# Patient Record
Sex: Female | Born: 1955 | Race: White | Hispanic: No | Marital: Single | Smoking: Former smoker
Health system: Southern US, Community
[De-identification: ages and names within clinical notes are randomized; demographics above are authoritative.]

## PROBLEM LIST (undated history)

## (undated) DIAGNOSIS — E119 Type 2 diabetes mellitus without complications: Secondary | ICD-10-CM

## (undated) DIAGNOSIS — N289 Disorder of kidney and ureter, unspecified: Secondary | ICD-10-CM

## (undated) DIAGNOSIS — N185 Chronic kidney disease, stage 5: Secondary | ICD-10-CM

## (undated) DIAGNOSIS — I10 Essential (primary) hypertension: Secondary | ICD-10-CM

## (undated) DIAGNOSIS — I350 Nonrheumatic aortic (valve) stenosis: Secondary | ICD-10-CM

## (undated) DIAGNOSIS — I5031 Acute diastolic (congestive) heart failure: Secondary | ICD-10-CM

## (undated) DIAGNOSIS — I5032 Chronic diastolic (congestive) heart failure: Secondary | ICD-10-CM

## (undated) DIAGNOSIS — J449 Chronic obstructive pulmonary disease, unspecified: Secondary | ICD-10-CM

## (undated) DIAGNOSIS — N186 End stage renal disease: Secondary | ICD-10-CM

## (undated) DIAGNOSIS — E1122 Type 2 diabetes mellitus with diabetic chronic kidney disease: Secondary | ICD-10-CM

## (undated) HISTORY — PX: MANDIBLE SURGERY: SHX707

## (undated) HISTORY — DX: Chronic diastolic (congestive) heart failure: I50.32

## (undated) HISTORY — DX: Nonrheumatic aortic (valve) stenosis: I35.0

## (undated) HISTORY — DX: Acute diastolic (congestive) heart failure: I50.31

## (undated) HISTORY — DX: End stage renal disease: N18.6

## (undated) HISTORY — PX: RENAL BIOPSY: SHX156

## (undated) HISTORY — DX: Chronic obstructive pulmonary disease, unspecified: J44.9

## (undated) HISTORY — PX: APPENDECTOMY: SHX54

---

## 2020-04-30 ENCOUNTER — Other Ambulatory Visit: Payer: Self-pay

## 2020-04-30 ENCOUNTER — Encounter: Payer: Self-pay | Admitting: Emergency Medicine

## 2020-04-30 ENCOUNTER — Emergency Department: Payer: Medicaid Other

## 2020-04-30 ENCOUNTER — Inpatient Hospital Stay
Admission: EM | Admit: 2020-04-30 | Discharge: 2020-07-02 | DRG: 286 | Disposition: A | Discharge status: Home / Self Care | Payer: Medicaid Other | Attending: Internal Medicine | Admitting: Internal Medicine

## 2020-04-30 DIAGNOSIS — T502X5A Adverse effect of carbonic-anhydrase inhibitors, benzothiadiazides and other diuretics, initial encounter: Secondary | ICD-10-CM | POA: Diagnosis present

## 2020-04-30 DIAGNOSIS — R0600 Dyspnea, unspecified: Secondary | ICD-10-CM

## 2020-04-30 DIAGNOSIS — G4700 Insomnia, unspecified: Secondary | ICD-10-CM | POA: Diagnosis present

## 2020-04-30 DIAGNOSIS — N186 End stage renal disease: Secondary | ICD-10-CM | POA: Diagnosis present

## 2020-04-30 DIAGNOSIS — Z6832 Body mass index (BMI) 32.0-32.9, adult: Secondary | ICD-10-CM

## 2020-04-30 DIAGNOSIS — R519 Headache, unspecified: Secondary | ICD-10-CM | POA: Diagnosis present

## 2020-04-30 DIAGNOSIS — J440 Chronic obstructive pulmonary disease with acute lower respiratory infection: Secondary | ICD-10-CM | POA: Diagnosis present

## 2020-04-30 DIAGNOSIS — S37009A Unspecified injury of unspecified kidney, initial encounter: Secondary | ICD-10-CM

## 2020-04-30 DIAGNOSIS — E1122 Type 2 diabetes mellitus with diabetic chronic kidney disease: Secondary | ICD-10-CM | POA: Diagnosis not present

## 2020-04-30 DIAGNOSIS — M5030 Other cervical disc degeneration, unspecified cervical region: Secondary | ICD-10-CM | POA: Diagnosis present

## 2020-04-30 DIAGNOSIS — E8770 Fluid overload, unspecified: Secondary | ICD-10-CM | POA: Diagnosis not present

## 2020-04-30 DIAGNOSIS — E119 Type 2 diabetes mellitus without complications: Secondary | ICD-10-CM | POA: Insufficient documentation

## 2020-04-30 DIAGNOSIS — N2581 Secondary hyperparathyroidism of renal origin: Secondary | ICD-10-CM | POA: Diagnosis present

## 2020-04-30 DIAGNOSIS — I5083 High output heart failure: Secondary | ICD-10-CM | POA: Diagnosis present

## 2020-04-30 DIAGNOSIS — E1143 Type 2 diabetes mellitus with diabetic autonomic (poly)neuropathy: Secondary | ICD-10-CM | POA: Diagnosis present

## 2020-04-30 DIAGNOSIS — Z825 Family history of asthma and other chronic lower respiratory diseases: Secondary | ICD-10-CM

## 2020-04-30 DIAGNOSIS — E875 Hyperkalemia: Secondary | ICD-10-CM | POA: Diagnosis present

## 2020-04-30 DIAGNOSIS — M899 Disorder of bone, unspecified: Secondary | ICD-10-CM | POA: Diagnosis present

## 2020-04-30 DIAGNOSIS — I951 Orthostatic hypotension: Secondary | ICD-10-CM | POA: Diagnosis present

## 2020-04-30 DIAGNOSIS — K5900 Constipation, unspecified: Secondary | ICD-10-CM | POA: Diagnosis present

## 2020-04-30 DIAGNOSIS — J189 Pneumonia, unspecified organism: Secondary | ICD-10-CM | POA: Diagnosis not present

## 2020-04-30 DIAGNOSIS — G43909 Migraine, unspecified, not intractable, without status migrainosus: Secondary | ICD-10-CM | POA: Diagnosis present

## 2020-04-30 DIAGNOSIS — Z8673 Personal history of transient ischemic attack (TIA), and cerebral infarction without residual deficits: Secondary | ICD-10-CM

## 2020-04-30 DIAGNOSIS — N185 Chronic kidney disease, stage 5: Secondary | ICD-10-CM | POA: Diagnosis not present

## 2020-04-30 DIAGNOSIS — I313 Pericardial effusion (noninflammatory): Secondary | ICD-10-CM | POA: Diagnosis present

## 2020-04-30 DIAGNOSIS — D631 Anemia in chronic kidney disease: Secondary | ICD-10-CM | POA: Diagnosis present

## 2020-04-30 DIAGNOSIS — E669 Obesity, unspecified: Secondary | ICD-10-CM | POA: Diagnosis present

## 2020-04-30 DIAGNOSIS — R0989 Other specified symptoms and signs involving the circulatory and respiratory systems: Secondary | ICD-10-CM | POA: Diagnosis present

## 2020-04-30 DIAGNOSIS — G40909 Epilepsy, unspecified, not intractable, without status epilepticus: Secondary | ICD-10-CM | POA: Diagnosis present

## 2020-04-30 DIAGNOSIS — Z87891 Personal history of nicotine dependence: Secondary | ICD-10-CM

## 2020-04-30 DIAGNOSIS — J90 Pleural effusion, not elsewhere classified: Secondary | ICD-10-CM

## 2020-04-30 DIAGNOSIS — D7589 Other specified diseases of blood and blood-forming organs: Secondary | ICD-10-CM | POA: Diagnosis present

## 2020-04-30 DIAGNOSIS — N184 Chronic kidney disease, stage 4 (severe): Secondary | ICD-10-CM | POA: Diagnosis present

## 2020-04-30 DIAGNOSIS — I5033 Acute on chronic diastolic (congestive) heart failure: Secondary | ICD-10-CM | POA: Diagnosis present

## 2020-04-30 DIAGNOSIS — Z992 Dependence on renal dialysis: Secondary | ICD-10-CM

## 2020-04-30 DIAGNOSIS — F40240 Claustrophobia: Secondary | ICD-10-CM | POA: Diagnosis present

## 2020-04-30 DIAGNOSIS — Z87820 Personal history of traumatic brain injury: Secondary | ICD-10-CM

## 2020-04-30 DIAGNOSIS — J918 Pleural effusion in other conditions classified elsewhere: Secondary | ICD-10-CM | POA: Diagnosis present

## 2020-04-30 DIAGNOSIS — I132 Hypertensive heart and chronic kidney disease with heart failure and with stage 5 chronic kidney disease, or end stage renal disease: Principal | ICD-10-CM | POA: Diagnosis present

## 2020-04-30 DIAGNOSIS — Z20822 Contact with and (suspected) exposure to covid-19: Secondary | ICD-10-CM | POA: Diagnosis present

## 2020-04-30 DIAGNOSIS — I5031 Acute diastolic (congestive) heart failure: Secondary | ICD-10-CM | POA: Diagnosis present

## 2020-04-30 DIAGNOSIS — Z8249 Family history of ischemic heart disease and other diseases of the circulatory system: Secondary | ICD-10-CM

## 2020-04-30 DIAGNOSIS — G4486 Cervicogenic headache: Secondary | ICD-10-CM | POA: Diagnosis present

## 2020-04-30 DIAGNOSIS — N170 Acute kidney failure with tubular necrosis: Secondary | ICD-10-CM

## 2020-04-30 DIAGNOSIS — I272 Pulmonary hypertension, unspecified: Secondary | ICD-10-CM | POA: Diagnosis present

## 2020-04-30 DIAGNOSIS — L8990 Pressure ulcer of unspecified site, unspecified stage: Secondary | ICD-10-CM | POA: Insufficient documentation

## 2020-04-30 DIAGNOSIS — I10 Essential (primary) hypertension: Secondary | ICD-10-CM | POA: Diagnosis present

## 2020-04-30 DIAGNOSIS — L89152 Pressure ulcer of sacral region, stage 2: Secondary | ICD-10-CM | POA: Diagnosis present

## 2020-04-30 HISTORY — DX: Essential (primary) hypertension: I10

## 2020-04-30 HISTORY — DX: Type 2 diabetes mellitus without complications: E11.9

## 2020-04-30 HISTORY — DX: Chronic kidney disease, stage 5: N18.5

## 2020-04-30 HISTORY — DX: Type 2 diabetes mellitus with diabetic chronic kidney disease: E11.22

## 2020-04-30 HISTORY — DX: Disorder of kidney and ureter, unspecified: N28.9

## 2020-04-30 LAB — CBC
HCT: 34.4 % — ABNORMAL LOW (ref 36.0–46.0)
Hemoglobin: 10.5 g/dL — ABNORMAL LOW (ref 12.0–15.0)
MCH: 31.1 pg (ref 26.0–34.0)
MCHC: 30.5 g/dL (ref 30.0–36.0)
MCV: 101.8 fL — ABNORMAL HIGH (ref 80.0–100.0)
Platelets: 197 10*3/uL (ref 150–400)
RBC: 3.38 MIL/uL — ABNORMAL LOW (ref 3.87–5.11)
RDW: 14.6 % (ref 11.5–15.5)
WBC: 9.2 10*3/uL (ref 4.0–10.5)
nRBC: 0 % (ref 0.0–0.2)

## 2020-04-30 LAB — BASIC METABOLIC PANEL
Anion gap: 8 (ref 5–15)
BUN: 83 mg/dL — ABNORMAL HIGH (ref 8–23)
CO2: 20 mmol/L — ABNORMAL LOW (ref 22–32)
Calcium: 8.5 mg/dL — ABNORMAL LOW (ref 8.9–10.3)
Chloride: 115 mmol/L — ABNORMAL HIGH (ref 98–111)
Creatinine, Ser: 2.97 mg/dL — ABNORMAL HIGH (ref 0.44–1.00)
GFR, Estimated: 17 mL/min — ABNORMAL LOW (ref >60)
Glucose, Bld: 116 mg/dL — ABNORMAL HIGH (ref 70–99)
Potassium: 5.6 mmol/L — ABNORMAL HIGH (ref 3.5–5.1)
Sodium: 143 mmol/L (ref 135–145)

## 2020-04-30 LAB — HEPATIC FUNCTION PANEL
ALT: 26 U/L (ref 0–44)
AST: 20 U/L (ref 15–41)
Albumin: 3.2 g/dL — ABNORMAL LOW (ref 3.5–5.0)
Alkaline Phosphatase: 68 U/L (ref 38–126)
Bilirubin, Direct: 0.2 mg/dL (ref 0.0–0.2)
Indirect Bilirubin: 0.7 mg/dL (ref 0.3–0.9)
Total Bilirubin: 0.9 mg/dL (ref 0.3–1.2)
Total Protein: 6.6 g/dL (ref 6.5–8.1)

## 2020-04-30 LAB — TROPONIN I (HIGH SENSITIVITY)
Troponin I (High Sensitivity): 24 ng/L — ABNORMAL HIGH (ref <18)
Troponin I (High Sensitivity): 21 ng/L — ABNORMAL HIGH (ref <18)

## 2020-04-30 LAB — BRAIN NATRIURETIC PEPTIDE: B Natriuretic Peptide: 769.6 pg/mL — ABNORMAL HIGH (ref 0.0–100.0)

## 2020-04-30 LAB — RESP PANEL BY RT-PCR (FLU A&B, COVID) ARPGX2
Influenza A by PCR: NEGATIVE
Influenza B by PCR: NEGATIVE
SARS Coronavirus 2 by RT PCR: NEGATIVE

## 2020-04-30 LAB — GLUCOSE, CAPILLARY: Glucose-Capillary: 91 mg/dL (ref 70–99)

## 2020-04-30 MED ORDER — FUROSEMIDE 10 MG/ML IJ SOLN
60.0000 mg | Freq: Two times a day (BID) | INTRAMUSCULAR | Status: DC
  Administered 2020-05-01 – 2020-05-02 (×3): 60 mg via INTRAVENOUS
  Filled 2020-04-30 (×3): qty 6

## 2020-04-30 MED ORDER — OXYCODONE-ACETAMINOPHEN 5-325 MG PO TABS
1.0000 | ORAL_TABLET | Freq: Once | ORAL | Status: AC
Start: 2020-04-30 — End: 2020-05-01
  Administered 2020-05-01: 02:00:00 1 via ORAL
  Filled 2020-04-30: qty 1

## 2020-04-30 MED ORDER — ACETAMINOPHEN 650 MG RE SUPP: 650.0000 mg | Freq: Four times a day (QID) | RECTAL | Status: DC | PRN

## 2020-04-30 MED ORDER — INSULIN ASPART 100 UNIT/ML ~~LOC~~ SOLN
0.0000 [IU] | Freq: Three times a day (TID) | SUBCUTANEOUS | Status: DC
  Administered 2020-05-03: 08:00:00 1 [IU] via SUBCUTANEOUS
  Administered 2020-05-03: 12:00:00 2 [IU] via SUBCUTANEOUS
  Administered 2020-05-05 – 2020-05-20 (×16): 1 [IU] via SUBCUTANEOUS
  Administered 2020-05-21: 5 [IU] via SUBCUTANEOUS
  Administered 2020-05-22: 2 [IU] via SUBCUTANEOUS
  Administered 2020-05-22: 1 [IU] via SUBCUTANEOUS
  Filled 2020-04-30 (×19): qty 1

## 2020-04-30 MED ORDER — NITROGLYCERIN 2 % TD OINT
1.0000 [in_us] | TOPICAL_OINTMENT | Freq: Four times a day (QID) | TRANSDERMAL | Status: AC
  Administered 2020-04-30 – 2020-05-01 (×2): 1 [in_us] via TOPICAL
  Filled 2020-04-30 (×2): qty 1

## 2020-04-30 MED ORDER — HYDRALAZINE HCL 25 MG PO TABS
25.0000 mg | ORAL_TABLET | Freq: Three times a day (TID) | ORAL | Status: DC
  Administered 2020-05-01 – 2020-05-05 (×16): 25 mg via ORAL
  Filled 2020-04-30 (×16): qty 1

## 2020-04-30 MED ORDER — ENOXAPARIN SODIUM 30 MG/0.3ML ~~LOC~~ SOLN
30.0000 mg | SUBCUTANEOUS | Status: DC
  Administered 2020-05-01 – 2020-05-03 (×4): 30 mg via SUBCUTANEOUS
  Filled 2020-04-30 (×4): qty 0.3

## 2020-04-30 MED ORDER — FUROSEMIDE 10 MG/ML IJ SOLN
60.0000 mg | Freq: Once | INTRAMUSCULAR | Status: AC
  Administered 2020-04-30: 19:00:00 60 mg via INTRAVENOUS
  Filled 2020-04-30: qty 8

## 2020-04-30 MED ORDER — SODIUM ZIRCONIUM CYCLOSILICATE 10 G PO PACK
10.0000 g | PACK | Freq: Once | ORAL | Status: AC
  Administered 2020-04-30: 21:00:00 10 g via ORAL
  Filled 2020-04-30: qty 1

## 2020-04-30 MED ORDER — HYDRALAZINE HCL 20 MG/ML IJ SOLN
10.0000 mg | Freq: Once | INTRAMUSCULAR | Status: AC
  Administered 2020-04-30: 19:00:00 10 mg via INTRAVENOUS
  Filled 2020-04-30: qty 1

## 2020-04-30 MED ORDER — ONDANSETRON HCL 4 MG/2ML IJ SOLN
4.0000 mg | Freq: Four times a day (QID) | INTRAMUSCULAR | Status: DC | PRN
  Administered 2020-05-10 – 2020-06-24 (×6): 4 mg via INTRAVENOUS
  Filled 2020-04-30 (×5): qty 2

## 2020-04-30 MED ORDER — ACETAMINOPHEN 325 MG PO TABS
650.0000 mg | ORAL_TABLET | Freq: Four times a day (QID) | ORAL | Status: DC | PRN
  Administered 2020-04-30 – 2020-05-03 (×2): 650 mg via ORAL
  Filled 2020-04-30 (×2): qty 2

## 2020-04-30 MED ORDER — ONDANSETRON HCL 4 MG PO TABS
4.0000 mg | ORAL_TABLET | Freq: Four times a day (QID) | ORAL | Status: DC | PRN
  Administered 2020-05-30 – 2020-06-01 (×2): 4 mg via ORAL
  Filled 2020-04-30 (×2): qty 1

## 2020-04-30 NOTE — ED Triage Notes (Addendum)
Pt arrived via [facility] from home, pt recently  Relocated from [place] on Wed, pt has hx of CKD IV. Pt states she arrived via plane.   Per EMS pt BP 217/110 O2 97% RA   Pt c/o generalized weakness that started "a while ago"  Pt moved here to live with a friend.  Pt has hx of chronic back and neck pain as well as migraines.

## 2020-04-30 NOTE — ED Provider Notes (Signed)
[facility] Emergency Department Provider Note  ____________________________________________   Event Date/Time   First MD Initiated Contact with Patient 04/30/20 1416     (approximate)  I have reviewed the triage vital signs and the nursing notes.   HISTORY  Chief Complaint Weakness    HPI Katie [name] is a 64 y.o. female  With self-reported h/o CKD, CHF, HTN, recent CVAs, here with generalized weakness and SOB. Pt just moved from [place], arrived 2 days ago. She states that over the last week, she's had progressively worsening weakness, cough, and SOB. She's had associated increased LE edema, as well as increasing HTN at home. She states she had to move to [facility] for personal reasons, but has not seen or established care with any PCP or specialists here. She does not decreased UOP. No hematuria, dysuria. She does not recall her baseline creatinine. She's had a mild cough as well as DOE, orthopnea. No chest pain.       Past Medical History:  Diagnosis Date  . Diabetes mellitus without complication ([place])   . Hypertension   . Renal disorder     There are no problems to display for this patient.     Prior to Admission medications   Not on File    Allergies Patient has no known allergies.  No family history on file.  Social History Social History   Tobacco Use  . Smoking status: Former Research scientist (life sciences)  . Smokeless tobacco: Never Used  Vaping Use  . Vaping Use: Never used    Review of Systems  Review of Systems  Constitutional: Positive for fatigue. Negative for fever.  HENT: Negative for congestion and sore throat.   Eyes: Negative for visual disturbance.  Respiratory: Positive for cough and shortness of breath.   Cardiovascular: Positive for leg swelling. Negative for chest pain.  Gastrointestinal: Negative for abdominal pain, diarrhea, nausea and vomiting.  Genitourinary: Negative for flank pain.  Musculoskeletal: Negative for back pain and neck  pain.  Skin: Negative for rash and wound.  Neurological: Positive for weakness.  All other systems reviewed and are negative.    ____________________________________________  PHYSICAL EXAM:      VITAL SIGNS: ED Triage Vitals  Enc Vitals Group     BP 04/30/20 1156 (!) 223/100     Pulse Rate 04/30/20 1156 88     Resp 04/30/20 1156 18     Temp 04/30/20 1156 98 F (36.7 C)     Temp Source 04/30/20 1156 Oral     SpO2 04/30/20 1156 95 %     Weight 04/30/20 1151 200 lb (90.7 kg)     Height 04/30/20 1151 5\' 6"  (1.676 m)     Head Circumference --      Peak Flow --      Pain Score 04/30/20 1151 6     Pain Loc --      Pain Edu? --      Excl. in [place]? --      Physical Exam Vitals and nursing note reviewed.  Constitutional:      General: She is not in acute distress.    Appearance: She is well-developed.  HENT:     Head: Normocephalic and atraumatic.  Eyes:     Conjunctiva/sclera: Conjunctivae normal.  Cardiovascular:     Rate and Rhythm: Normal rate and regular rhythm.     Heart sounds: Normal heart sounds. No murmur heard. No friction rub.  Pulmonary:     Effort: Pulmonary effort is  normal. No respiratory distress.     Breath sounds: Rales (bibasilar) present. No wheezing.  Abdominal:     General: There is distension.     Palpations: Abdomen is soft.     Tenderness: There is no abdominal tenderness.  Musculoskeletal:     Cervical back: Neck supple. Tenderness: 3+     Right lower leg: Edema (3+) present.     Left lower leg: Edema present.  Skin:    General: Skin is warm.     Capillary Refill: Capillary refill takes less than 2 seconds.  Neurological:     Mental Status: She is alert and oriented to person, place, and time.     Motor: No abnormal muscle tone.       ____________________________________________   LABS (all labs ordered are listed, but only abnormal results are displayed)  Labs Reviewed  BASIC METABOLIC PANEL - Abnormal; Notable for the following  components:      Result Value   Potassium 5.6 (*)    Chloride 115 (*)    CO2 20 (*)    Glucose, Bld 116 (*)    BUN 83 (*)    Creatinine, Ser 2.97 (*)    Calcium 8.5 (*)    GFR, Estimated 17 (*)    All other components within normal limits  CBC - Abnormal; Notable for the following components:   RBC 3.38 (*)    Hemoglobin 10.5 (*)    HCT 34.4 (*)    MCV 101.8 (*)    All other components within normal limits  RESP PANEL BY RT-PCR (FLU A&B, COVID) ARPGX2  URINALYSIS, COMPLETE (UACMP) WITH MICROSCOPIC  HEPATIC FUNCTION PANEL  BRAIN NATRIURETIC PEPTIDE  CBG MONITORING, ED  TROPONIN I (HIGH SENSITIVITY)    ____________________________________________  EKG: Normal sinus rhythm, VR 87. PR 164, QRS 76, QTc 440. No acute ST elevation or depression. No ischemia or infarct. ________________________________________  RADIOLOGY All imaging, including plain films, CT scans, and ultrasounds, independently reviewed by me, and interpretations confirmed via formal radiology reads.  ED MD interpretation:   CXR: Pending  Official radiology report(s): No results found.  ____________________________________________  PROCEDURES   Procedure(s) performed (including Critical Care):  Procedures  ____________________________________________  INITIAL IMPRESSION / MDM / [place] / ED COURSE  As part of my medical decision making, I reviewed the following data within the [employer] notes reviewed and incorporated, Old chart reviewed, Notes from prior ED visits, and [place] Controlled Substance Database       *[name] was evaluated in Emergency Department on 04/30/2020 for the symptoms described in the history of present illness. She was evaluated in the context of the global COVID-19 pandemic, which necessitated consideration that the patient might be at risk for infection with the SARS-CoV-2 virus that causes COVID-19. Institutional protocols and  algorithms that pertain to the evaluation of patients at risk for COVID-19 are in a state of rapid change based on information released by regulatory bodies including the CDC and federal and state organizations. These policies and algorithms were followed during the patient's care in the ED.  Some ED evaluations and interventions may be delayed as a result of limited staffing during the pandemic.*     Medical Decision Making:  64 yo F with reported h/o HTN, CKD, CVA, CHF, here with weakness, SOB, and tachypnea. Clinically, suspect anasarca with suspected CHF, likely 2/2 worsening renal function as well as CHF. Cr is 2.9 here, unclear what her baseline is though  elevated K suggests this is somewhat acute. She has b/l rales on exam, increased WOB. Will likely need IV diuresis, for which it will likely be best to admit given her CKD, tenuous resp status, and unknown heart function.   ____________________________________________  FINAL CLINICAL IMPRESSION(S) / ED DIAGNOSES  Final diagnoses:  Chronic kidney disease (CKD), stage IV (severe) ([place])     MEDICATIONS GIVEN DURING THIS VISIT:  Medications - No data to display   ED Discharge Orders    None       Note:  This document was prepared using Dragon voice recognition software and may include unintentional dictation errors.   Duffy Bruce, MD 04/30/20 [phone]

## 2020-04-30 NOTE — H&P (Signed)
History and Physical    Katie [name] TSV:[id number] DOB: [birth year] DOA: 04/30/2020  PCP: Patient, [name]  Patient coming from: Home.  I have personally briefly reviewed patient's old medical records in [place]  Chief Complaint: Shortness of breath.  HPI: Katie [name] is a 64 y.o. female with medical history significant of type 2 diabetes, stage V CKD, unspecified congestive heart failure, hypertension who came to the emergency department via EMS due to dyspnea and generalized weakness that started earlier today.  She just [place] on [place].  She was recently hospitalized due to heart failure, but was unable to a specify further.  She was not able to to tell her EF or type of CHF.  According to the patient, she has been compliant with medications, sodium and fluid intake.  She mentions that she has been having occasional palpitations lightheadedness and occasional chest discomfort, but not today with the symptoms.  No fever, sore throat, rhinorrhea, wheezing or hemoptysis.  She has occasional abdominal pain.  She has frequent loose stools, which alternates with constipation.  No melena or hematochezia.  No dysuria, frequency or hematuria.  ED Course: Initial vital signs were temperature 98 F, pulse 88, respiration 18, blood pressure 223/100 mmHg and O2 sat 95% on room air.  The patient received furosemide 60 mg IVP and hydralazine 10 mg IVP.  I added Lokelma 10 g p.o. x1 dose.  Lab work: CBC showed white count 9.2, hemoglobin 10.5 g/dL with an MCV of 101.8 fL and platelets 197.  BNP was 769.6 pg/mL.  Troponin was 24 and then 21 ng/L.  LFTs are normal, except for an albumin of 3.2 g/dL.  Sodium 143, potassium 5.6, chloride 115 and CO2 20 mmol/L.  Glucose 116, BUN 83 and creatinine 2.97 mg/dL.  Imaging: A 2 view chest radiograph show cardiomegaly with bivascular chest densities suggesting bilateral pleural effusions, although underlying parenchymal  disease cannot be ruled out.  Please see images and full regular report for further detail.  Review of Systems: As per HPI otherwise all other systems reviewed and are negative.  Past Medical History:  Diagnosis Date  . Diabetes mellitus without complication ([place])   . Hypertension   . Renal disorder     Past Surgical History:  Procedure Laterality Date  . APPENDECTOMY    . MANDIBLE SURGERY    . RENAL BIOPSY Right     Social History  reports that she has quit smoking. She has never used smokeless tobacco. She reports previous alcohol use. No history on file for drug use.  No Known Allergies  Family History  Problem Relation Age of Onset  . Heart attack Mother   . Asthma Mother   . Uterine cancer Mother   . Skin cancer Father   . Prostate cancer Father   . CAD Father   . Parkinson's disease Father   . Lung cancer Brother    Prior to Admission medications   Not on File   The patient does not remember which medication she is taking.  Physical Exam: Vitals:   04/30/20 1700 04/30/20 1900 04/30/20 2011 04/30/20 2045  BP: (!) 161/135 (!) 223/108 (!) 200/92 (!) 175/86  Pulse: 85 81 84 84  Resp:   18   Temp:  97.8 F (36.6 C) 98.8 F (37.1 C) 98.3 F (36.8 C)  TempSrc:  Oral Oral Oral  SpO2: 99% 100% 98% 99%  Weight:    91.7 kg  Height:  5\' 6"  (1.676 m)   Constitutional: Looks chronically ill. Eyes: PERRL, lids and conjunctivae normal ENMT: Mucous membranes are moist. Posterior pharynx clear of any exudate or lesions. Neck: normal, supple, no masses, no thyromegaly Respiratory: Bibasilar crackles.  No wheezing or rhonchi.  Normal respiratory effort. No accessory muscle use.  Cardiovascular: Regular rate and rhythm, no murmurs / rubs / gallops.  3+ bilateral lower extremity pitting edema. 2+ pedal pulses. No carotid bruits.  Abdomen: Obese, nondistended.  Bowel sounds positive.  Soft, no tenderness, no masses palpated. No hepatosplenomegaly. Musculoskeletal:  Generalized weakness.  No clubbing / cyanosis.  Good ROM, no contractures. Normal muscle tone.  Skin: Right calf erythematous area. Neurologic: CN 2-12 grossly intact. Sensation intact, DTR normal. Strength 4 /5 in all 4.  Psychiatric: Normal judgment and insight. Alert and oriented x 3. Normal mood.  Labs on Admission: I have personally reviewed following labs and imaging studies  CBC: Recent Labs  Lab 04/30/20 1158  WBC 9.2  HGB 10.5*  HCT 34.4*  MCV 101.8*  PLT 353    Basic Metabolic Panel: Recent Labs  Lab 04/30/20 1158  NA 143  K 5.6*  CL 115*  CO2 20*  GLUCOSE 116*  BUN 83*  CREATININE 2.97*  CALCIUM 8.5*    GFR: Estimated Creatinine Clearance: 22.7 mL/min (A) (by C-G formula based on SCr of 2.97 mg/dL (H)).  Liver Function Tests: Recent Labs  Lab 04/30/20 1601  AST 20  ALT 26  ALKPHOS 68  BILITOT 0.9  PROT 6.6  ALBUMIN 3.2*    Urine analysis: No results found for: COLORURINE, APPEARANCEUR, LABSPEC, PHURINE, GLUCOSEU, HGBUR, BILIRUBINUR, KETONESUR, PROTEINUR, UROBILINOGEN, NITRITE, LEUKOCYTESUR  Radiological Exams on Admission: DG Chest 2 View  Result Date: 04/30/2020 CLINICAL DATA:  Shortness of breath. EXAM: CHEST - 2 VIEW COMPARISON:  None. FINDINGS: Cardiac silhouette is enlarged. There are bibasilar densities, right side greater than left. Evidence for pleural effusions, right side greater than left. Negative for pneumothorax. Atherosclerotic calcifications at the aortic arch. IMPRESSION: Cardiomegaly with bibasilar chest densities. Findings are suggestive for bilateral pleural effusions, right side greater than left. There may be underlying consolidation or parenchymal disease in the right lung. Electronically Signed   By: Markus Daft M.D.   On: 04/30/2020 15:48    EKG: Independently reviewed.  Vent. rate 87 BPM PR interval 164 ms QRS duration 76 ms QT/QTc 366/440 ms P-R-T axes 29 -26 59 Normal sinus rhythm Anterior infarct , age  undetermined Abnormal ECG  Assessment/Plan Principal Problem:   Volume overload Unspecified CHF/ESRD. Continue supplemental oxygen. Fluid restriction. Sodium restriction. Continue furosemide 60 mg IV twice daily. Apply nitroglycerin ointment to ACW overnight. Hydralazine 25 mg p.o. 3 times daily. Monitor daily weights, intake and output. Monitor renal function electrolytes. Check echocardiogram.  Active Problems:   Type 2 diabetes mellitus with stage 5 chronic kidney disease (HCC) Carbohydrate modified diet. Check hemoglobin A1c. Does not know her home meds. CBG monitoring with [facility].    Hypertension Continue furosemide 60 mg IVP. twice daily. Start hydralazine 25 mg p.o. 3 times daily. Needs home med reconciliation/cannot not remember Monitor blood pressure.    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 2 diabetes mellitus [facility]) May be approaching time to start HD or PD. Needs to establish with nephrology.    Hyperkalemia Lokelma 10 g single dose. She is also on furosemide. Follow-up potassium level.     DVT prophylaxis: Lovenox SQ. Code Status:   Full code. Family Communication: Disposition Plan:  Patient is from:  Home.  Anticipated DC to:  Home.  Anticipated DC date:  05/02/2020.  Anticipated DC barriers: Clinical status.  Consults called: Admission status:  Observation/progressive cardiac unit.    Severity of Illness: Very high due to exacerbated unspecified CHF with volume overload in the setting of stage V CKD.  Reubin Milan MD [facility]  How to contact the [facility] Attending or Consulting provider [place] or covering provider during after hours [place], for this patient?   1. Check the care team in [facility] and look for a) attending/consulting TR[facility] provider listed and b) the [facility] team listed 2. Log into [email or web address] and use 's universal password to access. If you do not have the password, please contact the hospital operator. 3. Locate the [facility]  provider you are looking for under [facility] and page to a number that you can be directly reached. 4. If you still have difficulty reaching the provider, please page the [facility] (Director on Call) for the Hospitalists listed on amion for assistance.  04/30/2020, 10:51 PM   This document was prepared using Dragon voice recognition software and may contain some unintended transcription errors.

## 2020-05-01 ENCOUNTER — Observation Stay: Payer: Medicaid Other

## 2020-05-01 ENCOUNTER — Observation Stay
Admit: 2020-05-01 | Discharge: 2020-05-01 | Disposition: A | Discharge status: Home / Self Care | Payer: Medicaid Other | Attending: Internal Medicine | Admitting: Internal Medicine

## 2020-05-01 ENCOUNTER — Inpatient Hospital Stay: Payer: Medicaid Other

## 2020-05-01 DIAGNOSIS — I272 Pulmonary hypertension, unspecified: Secondary | ICD-10-CM | POA: Diagnosis present

## 2020-05-01 DIAGNOSIS — E1143 Type 2 diabetes mellitus with diabetic autonomic (poly)neuropathy: Secondary | ICD-10-CM | POA: Diagnosis present

## 2020-05-01 DIAGNOSIS — Z992 Dependence on renal dialysis: Secondary | ICD-10-CM | POA: Diagnosis not present

## 2020-05-01 DIAGNOSIS — T502X5A Adverse effect of carbonic-anhydrase inhibitors, benzothiadiazides and other diuretics, initial encounter: Secondary | ICD-10-CM | POA: Diagnosis present

## 2020-05-01 DIAGNOSIS — I313 Pericardial effusion (noninflammatory): Secondary | ICD-10-CM | POA: Diagnosis present

## 2020-05-01 DIAGNOSIS — J918 Pleural effusion in other conditions classified elsewhere: Secondary | ICD-10-CM | POA: Diagnosis present

## 2020-05-01 DIAGNOSIS — G4489 Other headache syndrome: Secondary | ICD-10-CM | POA: Diagnosis not present

## 2020-05-01 DIAGNOSIS — G8929 Other chronic pain: Secondary | ICD-10-CM | POA: Diagnosis not present

## 2020-05-01 DIAGNOSIS — G43909 Migraine, unspecified, not intractable, without status migrainosus: Secondary | ICD-10-CM | POA: Diagnosis present

## 2020-05-01 DIAGNOSIS — E8779 Other fluid overload: Secondary | ICD-10-CM | POA: Diagnosis not present

## 2020-05-01 DIAGNOSIS — N184 Chronic kidney disease, stage 4 (severe): Secondary | ICD-10-CM | POA: Diagnosis not present

## 2020-05-01 DIAGNOSIS — R0600 Dyspnea, unspecified: Secondary | ICD-10-CM | POA: Diagnosis not present

## 2020-05-01 DIAGNOSIS — J189 Pneumonia, unspecified organism: Secondary | ICD-10-CM | POA: Diagnosis not present

## 2020-05-01 DIAGNOSIS — D631 Anemia in chronic kidney disease: Secondary | ICD-10-CM | POA: Diagnosis present

## 2020-05-01 DIAGNOSIS — E875 Hyperkalemia: Secondary | ICD-10-CM | POA: Diagnosis present

## 2020-05-01 DIAGNOSIS — N170 Acute kidney failure with tubular necrosis: Secondary | ICD-10-CM | POA: Diagnosis not present

## 2020-05-01 DIAGNOSIS — I10 Essential (primary) hypertension: Secondary | ICD-10-CM | POA: Diagnosis not present

## 2020-05-01 DIAGNOSIS — Z20822 Contact with and (suspected) exposure to covid-19: Secondary | ICD-10-CM | POA: Diagnosis present

## 2020-05-01 DIAGNOSIS — J90 Pleural effusion, not elsewhere classified: Secondary | ICD-10-CM | POA: Diagnosis not present

## 2020-05-01 DIAGNOSIS — I5030 Unspecified diastolic (congestive) heart failure: Secondary | ICD-10-CM | POA: Diagnosis not present

## 2020-05-01 DIAGNOSIS — Z87891 Personal history of nicotine dependence: Secondary | ICD-10-CM | POA: Diagnosis not present

## 2020-05-01 DIAGNOSIS — E1122 Type 2 diabetes mellitus with diabetic chronic kidney disease: Secondary | ICD-10-CM | POA: Diagnosis present

## 2020-05-01 DIAGNOSIS — J440 Chronic obstructive pulmonary disease with acute lower respiratory infection: Secondary | ICD-10-CM | POA: Diagnosis present

## 2020-05-01 DIAGNOSIS — I5031 Acute diastolic (congestive) heart failure: Secondary | ICD-10-CM | POA: Diagnosis not present

## 2020-05-01 DIAGNOSIS — L8990 Pressure ulcer of unspecified site, unspecified stage: Secondary | ICD-10-CM | POA: Insufficient documentation

## 2020-05-01 DIAGNOSIS — R0989 Other specified symptoms and signs involving the circulatory and respiratory systems: Secondary | ICD-10-CM | POA: Diagnosis present

## 2020-05-01 DIAGNOSIS — R531 Weakness: Secondary | ICD-10-CM | POA: Diagnosis not present

## 2020-05-01 DIAGNOSIS — I5033 Acute on chronic diastolic (congestive) heart failure: Secondary | ICD-10-CM | POA: Diagnosis present

## 2020-05-01 DIAGNOSIS — I132 Hypertensive heart and chronic kidney disease with heart failure and with stage 5 chronic kidney disease, or end stage renal disease: Secondary | ICD-10-CM | POA: Diagnosis present

## 2020-05-01 DIAGNOSIS — I951 Orthostatic hypotension: Secondary | ICD-10-CM | POA: Diagnosis present

## 2020-05-01 DIAGNOSIS — G4700 Insomnia, unspecified: Secondary | ICD-10-CM | POA: Diagnosis present

## 2020-05-01 DIAGNOSIS — D7589 Other specified diseases of blood and blood-forming organs: Secondary | ICD-10-CM | POA: Diagnosis present

## 2020-05-01 DIAGNOSIS — R0602 Shortness of breath: Secondary | ICD-10-CM | POA: Diagnosis present

## 2020-05-01 DIAGNOSIS — N185 Chronic kidney disease, stage 5: Secondary | ICD-10-CM | POA: Diagnosis not present

## 2020-05-01 DIAGNOSIS — N2581 Secondary hyperparathyroidism of renal origin: Secondary | ICD-10-CM | POA: Diagnosis present

## 2020-05-01 DIAGNOSIS — E8770 Fluid overload, unspecified: Secondary | ICD-10-CM | POA: Diagnosis not present

## 2020-05-01 DIAGNOSIS — L89152 Pressure ulcer of sacral region, stage 2: Secondary | ICD-10-CM | POA: Diagnosis present

## 2020-05-01 DIAGNOSIS — R519 Headache, unspecified: Secondary | ICD-10-CM | POA: Diagnosis not present

## 2020-05-01 DIAGNOSIS — N186 End stage renal disease: Secondary | ICD-10-CM | POA: Diagnosis present

## 2020-05-01 LAB — GLUCOSE, CAPILLARY
Glucose-Capillary: 78 mg/dL (ref 70–99)
Glucose-Capillary: 70 mg/dL (ref 70–99)
Glucose-Capillary: 115 mg/dL — ABNORMAL HIGH (ref 70–99)
Glucose-Capillary: 108 mg/dL — ABNORMAL HIGH (ref 70–99)

## 2020-05-01 LAB — CBC
HCT: 27.8 % — ABNORMAL LOW (ref 36.0–46.0)
Hemoglobin: 8.6 g/dL — ABNORMAL LOW (ref 12.0–15.0)
MCH: 31.3 pg (ref 26.0–34.0)
MCHC: 30.9 g/dL (ref 30.0–36.0)
MCV: 101.1 fL — ABNORMAL HIGH (ref 80.0–100.0)
Platelets: 150 10*3/uL (ref 150–400)
RBC: 2.75 MIL/uL — ABNORMAL LOW (ref 3.87–5.11)
RDW: 14.4 % (ref 11.5–15.5)
WBC: 6.8 10*3/uL (ref 4.0–10.5)
nRBC: 0 % (ref 0.0–0.2)

## 2020-05-01 LAB — ECHOCARDIOGRAM COMPLETE
AR max vel: 1.44 cm2
AV Area VTI: 1.87 cm2
AV Area mean vel: 1.75 cm2
AV Mean grad: 16 mmHg
AV Peak grad: 38.7 mmHg
Ao pk vel: 3.11 m/s
Area-P 1/2: 3.86 cm2
Height: 66 in
P 1/2 time: 334 msec
S' Lateral: 3.04 cm
Weight: 3227.2 oz

## 2020-05-01 LAB — BASIC METABOLIC PANEL
Anion gap: 7 (ref 5–15)
BUN: 84 mg/dL — ABNORMAL HIGH (ref 8–23)
CO2: 20 mmol/L — ABNORMAL LOW (ref 22–32)
Calcium: 8.1 mg/dL — ABNORMAL LOW (ref 8.9–10.3)
Chloride: 114 mmol/L — ABNORMAL HIGH (ref 98–111)
Creatinine, Ser: 2.93 mg/dL — ABNORMAL HIGH (ref 0.44–1.00)
GFR, Estimated: 18 mL/min — ABNORMAL LOW (ref >60)
Glucose, Bld: 85 mg/dL (ref 70–99)
Potassium: 5 mmol/L (ref 3.5–5.1)
Sodium: 141 mmol/L (ref 135–145)

## 2020-05-01 LAB — HEMOGLOBIN A1C
Hgb A1c MFr Bld: 4.7 % — ABNORMAL LOW (ref 4.8–5.6)
Mean Plasma Glucose: 88.19 mg/dL

## 2020-05-01 LAB — RETICULOCYTES
Immature Retic Fract: 6.9 % (ref 2.3–15.9)
RBC.: 3.03 MIL/uL — ABNORMAL LOW (ref 3.87–5.11)
Retic Count, Absolute: 76.1 10*3/uL (ref 19.0–186.0)
Retic Ct Pct: 2.5 % (ref 0.4–3.1)

## 2020-05-01 LAB — HIV ANTIBODY (ROUTINE TESTING W REFLEX): HIV Screen 4th Generation wRfx: NONREACTIVE

## 2020-05-01 MED ORDER — SODIUM BICARBONATE 650 MG PO TABS
650.0000 mg | ORAL_TABLET | Freq: Three times a day (TID) | ORAL | Status: DC
  Administered 2020-05-01 – 2020-05-12 (×33): 650 mg via ORAL
  Filled 2020-05-01 (×33): qty 1

## 2020-05-01 MED ORDER — HYDRALAZINE HCL 10 MG PO TABS
10.0000 mg | ORAL_TABLET | Freq: Four times a day (QID) | ORAL | Status: DC | PRN
  Administered 2020-05-02 – 2020-05-03 (×2): 10 mg via ORAL
  Filled 2020-05-01 (×5): qty 1

## 2020-05-01 MED ORDER — AMLODIPINE BESYLATE 5 MG PO TABS
5.0000 mg | ORAL_TABLET | Freq: Every day | ORAL | Status: DC
  Administered 2020-05-01 – 2020-05-02 (×2): 5 mg via ORAL
  Filled 2020-05-01 (×2): qty 1

## 2020-05-01 NOTE — Consult Note (Signed)
Katie [name] MRN: [id number] DOB/AGE: 11-15-1955 64 y.o. Primary Care Physician:Patient, [name] Admit date: 04/30/2020 Chief Complaint:  Chief Complaint  Patient presents with  . Weakness   HPI:  64 year old Caucasian female with a past medical history of CKD, CHF, hypertension, CVA who came to the ER with chief complaint of weakness  History of present illness dated back stool over a week ago when patient started feeling weak it was generalized. Patient also complains of shortness of breath, progressive in nature, slow in onset Patient also complains of lower extremity edema/ Upon evaluation in the ER patient was found to be hypertensive with blood pressure of 223/100 and hyperkalemic.  Patient was also found to be anemic with creatinine of 2.9.  Patient was admitted with fluid overload Nephrology was consulted as patient creatinine is high Patient was seen today on second floor Patient main concern continues to be shortness of breath Patient does give a history of having had kidney problem from past 9 month Patient is not sure of her exact diagnosis.  Patient does give a history of having had renal biopsy done in the past Patient also complains of orthopnea-patient added the detail that how her use of pillows has  increased in the past 3 weeks No complaint of chest pain   Past Medical History:  Diagnosis Date  . 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[place])   . Hypertension   . Renal disorder   . Type 2 diabetes mellitus (HCC)         Family History  Problem Relation Age of Onset  . Heart attack Mother   . Asthma Mother   . Uterine cancer Mother   . Skin cancer Father   . Prostate cancer Father   . CAD Father   . Parkinson's disease Father   . Lung cancer Brother     Social History:  reports that she has quit smoking. She has never used smokeless tobacco. She reports previous alcohol use. No history on file for drug use.   Allergies: No Known  Allergies  No medications prior to admission.       XEN:MMHWK from the symptoms mentioned above,there are no other symptoms referable to all systems reviewed.  Marland Kitchen amLODipine  5 mg Oral Daily  . enoxaparin (LOVENOX) injection  30 mg Subcutaneous Q24H  . furosemide  60 mg Intravenous BID  . hydrALAZINE  25 mg Oral TID  . insulin aspart  0-9 Units Subcutaneous TID WC      Physical Exam: Vital signs in last 24 hours: Temp:  [97.7 F (36.5 C)-98.8 F (37.1 C)] 97.7 F (36.5 C) (12/19 1651) Pulse Rate:  [75-84] 75 (12/19 1651) Resp:  [18] 18 (12/19 1651) BP: (158-223)/(77-108) 194/98 (12/19 1651) SpO2:  [96 %-100 %] 96 % (12/19 1651) Weight:  [91.5 kg-91.7 kg] 91.5 kg (12/19 0327) Weight change:  Last BM Date: 04/30/20  Intake/Output from previous day: 12/18 0701 - 12/19 0700 In: -  Out: 250 [Urine:250] Total I/O In: -  Out: 1000 [Urine:1000]   Physical Exam: General- pt is awake,alert, oriented to time place and person HEENT-normocephalic nontraumatic Resp- No acute REsp distress, decreased at bases CVS- S1S2 regular ij rate and rhythm GIT- BS+, soft, NT, ND EXT- 2+ LE Edema, Cyanosis CNS- CN 2-12 grossly intact. Moving all 4 extremities Psych- normal mood and affect    Lab Results: CBC Recent Labs    04/30/20 1158 05/01/20 0519  WBC 9.2 6.8  HGB 10.5*  8.6*  HCT 34.4* 27.8*  PLT 197 150    BMET Recent Labs    04/30/20 1158 05/01/20 0519  NA 143 141  K 5.6* 5.0  CL 115* 114*  CO2 20* 20*  GLUCOSE 116* 85  BUN 83* 84*  CREATININE 2.97* 2.93*  CALCIUM 8.5* 8.1*       MICRO Recent Results (from the past 240 hour(s))  Resp Panel by RT-PCR (Flu A&B, Covid) Nasopharyngeal Swab     Status: None   Collection Time: 04/30/20  4:01 PM   Specimen: Nasopharyngeal Swab; Nasopharyngeal(NP) swabs in vial transport medium  Result Value Ref Range Status   SARS Coronavirus 2 by RT PCR NEGATIVE NEGATIVE Final    Comment: (NOTE) SARS-CoV-2 target  nucleic acids are NOT DETECTED.  The SARS-CoV-2 RNA is generally detectable in upper respiratory specimens during the acute phase of infection. The lowest concentration of SARS-CoV-2 viral copies this assay can detect is 138 copies/mL. A negative result does not preclude SARS-Cov-2 infection and should not be used as the sole basis for treatment or other patient management decisions. A negative result may occur with  improper specimen collection/handling, submission of specimen other than nasopharyngeal swab, presence of viral mutation(s) within the areas targeted by this assay, and inadequate number of viral copies(<138 copies/mL). A negative result must be combined with clinical observations, patient history, and epidemiological information. The expected result is Negative.  Fact Sheet for Patients:  EntrepreneurPulse.com.au  Fact Sheet for Healthcare Providers:  IncredibleEmployment.be  This test is no t yet approved or cleared by the [place] FDA and  has been authorized for detection and/or diagnosis of SARS-CoV-2 by FDA under an Emergency Use Authorization (EUA). This EUA will remain  in effect (meaning this test can be used) for the duration of the COVID-19 declaration under Section 564(b)(1) of the Act, 21 U.S.C.section 360bbb-3(b)(1), unless the authorization is terminated  or revoked sooner.       Influenza A by PCR NEGATIVE NEGATIVE Final   Influenza B by PCR NEGATIVE NEGATIVE Final    Comment: (NOTE) The Xpert Xpress SARS-CoV-2/FLU/RSV plus assay is intended as an aid in the diagnosis of influenza from Nasopharyngeal swab specimens and should not be used as a sole basis for treatment. Nasal washings and aspirates are unacceptable for Xpert Xpress SARS-CoV-2/FLU/RSV testing.  Fact Sheet for Patients: EntrepreneurPulse.com.au  Fact Sheet for Healthcare  Providers: IncredibleEmployment.be  This test is not yet approved or cleared by the [place] FDA and has been authorized for detection and/or diagnosis of SARS-CoV-2 by FDA under an Emergency Use Authorization (EUA). This EUA will remain in effect (meaning this test can be used) for the duration of the COVID-19 declaration under Section 564(b)(1) of the Act, 21 U.S.C. section 360bbb-3(b)(1), unless the authorization is terminated or revoked.  Performed at [facility], [address] [place], [place] [address]       Lab Results  Component Value Date   CALCIUM 8.1 (L) 05/01/2020      Impression:   1)Renal  AKI secondary to ATN AKI versus CKD Patient has CKD stage IV-most likely will try to get data from [place]. Patient did have the visiting cards of the nephrologist from [place].  I took the information Patient has CKD most likely secondary to diabetes mellitus We will ask for renal ultrasound and spot protein creatinine ratio   2)HTN  Patient blood pressure is now better controlled than before     3)Anemia Of chronic disease  Patient  will hemoglobin is not at goal We will initiate anemia work-up   4) secondary hyperparathyroidism-CKD Mineral-Bone Disorder PTH not avail. Secondary Hyperparathyroidism w/u pending.  We will check patient phosphorus  5) acute on chronic congestive heart failure Patient has a history of CHF Patient 2D echo has been done-results are pending Patient is currently on diuretics PMD following  6) electrolytes  Hyperkalemia Patient was hyperkalemic earlier patient is now better   NOrmonatremic  7)Acid base Co2 is not at goal  We will start patient on sodium bicarb   Plan:   We will ask for renal ultrasound.  We will ask for urine spot protein creatinine ratio.  We will start patient on p.o. bicarb  We will ask for phosphorus in the morning.  Awaiting 2D echo  results.  Awaiting data from the patient's nephrologist at [place] Patient did have biopsy done at [place] the patient must have had autoimmune work-up done as well We will follow up on the results  No acute need for renal replacement therapy today.  I did educate patient at length about her renal related issues Patient voiced understanding   Tommy Goostree s Theador Hawthorne 05/01/2020, 5:31 PM

## 2020-05-01 NOTE — Progress Notes (Signed)
PROGRESS NOTE    Katie [name]  GNF:[id number] DOB: 09-Jul-1955 DOA: 04/30/2020 PCP: Patient, [name]   Brief Narrative: Katie [name] is a 64 y.o. female with a history of diabetes mellitus type 2, stage IV CKD, heart failure, hypertension. Patient presented secondary to dyspnea with evidence of volume overload and pleural effusion.   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 Cardiomegaly on chest x-ray. Started on Lasix IV on admission. -Continue Lasix IV -Transthoracic Echocardiogram  Pleural effusion Repeat chest x-ray today. Patient on room air at rest this morning. -Chest x-ray  Diabetes mellitus, type 2 Hemoglobin A1C of 4.7%. No need for pharmacologic treatment.  CKD stage V Unknown baseline. Patient is unsure of her primary nephrologist in [place]. Creatinine of 2.97 on admission. Per patient, she was planned to start hemodialysis in addition to consideration for renal transplant. -Nephrology consult   Primary hypertension Patient is on at least hydrochlorothiazide as an outpatient but is unsure of her regimen. Currently uncontrolled. -Amlodipine 5 mg daily; continue Lasix diuresis -Add hydralazine 10 mg q6 hours prn  Anemia Possible related to underlying kidney disease. Evidence of macrocytosis on CBC. -Iron, TIBC, ferritin -Vitamin B12/folate  Hyperkalemia In setting of CKD. Lokelma 10 g given with improvement.  Pressure injury Mid coccyx, POA  Obesity Body mass index is 32.56 kg/m.   DVT prophylaxis: Lovenox Code Status:   Code Status: Full Code Family Communication: None at bedside Disposition Plan: Discharge home likely in 2-5 days pending diuresis   Consultants:   Nephrology  Procedures:    None  Antimicrobials:  None    Subjective: Patient with dyspnea which is not as bad as admission.  Objective: Vitals:   04/30/20 2045 05/01/20 0327 05/01/20 0747 05/01/20 1201  BP: (!) 175/86 (!) 158/77 (!) 196/94 (!) 191/95  Pulse: 84 83 84 83  Resp:   18 18  Temp: 98.3 F (36.8 C) 98.2 F (36.8 C) 98.2 F (36.8 C) 97.7 F (36.5 C)  TempSrc: Oral Oral Oral   SpO2: 99% 98% 97% 98%  Weight: 91.7 kg 91.5 kg    Height: 5\' 6"  (1.676 m)       Intake/Output Summary (Last 24 hours) at 05/01/2020 1213 Last data filed at 05/01/2020 1200 Gross per 24 hour  Intake --  Output 825 ml  Net -825 ml   Filed Weights   04/30/20 1151 04/30/20 2045 05/01/20 0327  Weight: 90.7 kg 91.7 kg 91.5 kg    Examination:  General exam: Appears calm and comfortable  Respiratory system: Diminished bilaterally R>L. Some rales heard. No wheezing. Normal respiratory effort. Cardiovascular system: S1 & S2 heard, RRR. 2/6 systolic murmur Gastrointestinal system: Abdomen is nondistended, soft and nontender. No organomegaly or masses felt. Normal bowel sounds heard. Central nervous system: Alert and oriented. No focal neurological deficits. Musculoskeletal: 2+ BLE edema. No calf tenderness Skin: No cyanosis. No rashes Psychiatry: Judgement and insight appear normal. Mood & affect appropriate.     Data Reviewed: I have personally reviewed following labs and imaging studies  CBC Lab Results  Component Value Date   WBC 6.8 05/01/2020   RBC 2.75 (L) 05/01/2020   HGB 8.6 (L) 05/01/2020   HCT 27.8 (L) 05/01/2020   MCV 101.1 (H) 05/01/2020  MCH 31.3 05/01/2020   PLT 150 05/01/2020   MCHC 30.9 05/01/2020   RDW 14.4 68/34/1962     Last metabolic panel Lab Results  Component Value Date   NA 141 05/01/2020   K 5.0 05/01/2020   CL 114 (H) 05/01/2020   CO2 20 (L) 05/01/2020   BUN 84 (H) 05/01/2020   CREATININE 2.93 (H) 05/01/2020   GLUCOSE 85 05/01/2020   GFRNONAA 18 (L) 05/01/2020    CALCIUM 8.1 (L) 05/01/2020   PROT 6.6 04/30/2020   ALBUMIN 3.2 (L) 04/30/2020   BILITOT 0.9 04/30/2020   ALKPHOS 68 04/30/2020   AST 20 04/30/2020   ALT 26 04/30/2020   ANIONGAP 7 05/01/2020    CBG (last 3)  Recent Labs    04/30/20 2047 05/01/20 0747 05/01/20 1159  GLUCAP 91 78 70     GFR: Estimated Creatinine Clearance: 23 mL/min (A) (by C-G formula based on SCr of 2.93 mg/dL (H)).  Coagulation Profile: No results for input(s): INR, PROTIME in the last 168 hours.  Recent Results (from the past 240 hour(s))  Resp Panel by RT-PCR (Flu A&B, Covid) Nasopharyngeal Swab     Status: None   Collection Time: 04/30/20  4:01 PM   Specimen: Nasopharyngeal Swab; Nasopharyngeal(NP) swabs in vial transport medium  Result Value Ref Range Status   SARS Coronavirus 2 by RT PCR NEGATIVE NEGATIVE Final    Comment: (NOTE) SARS-CoV-2 target nucleic acids are NOT DETECTED.  The SARS-CoV-2 RNA is generally detectable in upper respiratory specimens during the acute phase of infection. The lowest concentration of SARS-CoV-2 viral copies this assay can detect is 138 copies/mL. A negative result does not preclude SARS-Cov-2 infection and should not be used as the sole basis for treatment or other patient management decisions. A negative result may occur with  improper specimen collection/handling, submission of specimen other than nasopharyngeal swab, presence of viral mutation(s) within the areas targeted by this assay, and inadequate number of viral copies(<138 copies/mL). A negative result must be combined with clinical observations, patient history, and epidemiological information. The expected result is Negative.  Fact Sheet for Patients:  EntrepreneurPulse.com.au  Fact Sheet for Healthcare Providers:  IncredibleEmployment.be  This test is no t yet approved or cleared by the [place] FDA and  has been authorized for detection and/or diagnosis of  SARS-CoV-2 by FDA under an Emergency Use Authorization (EUA). This EUA will remain  in effect (meaning this test can be used) for the duration of the COVID-19 declaration under Section 564(b)(1) of the Act, 21 U.S.C.section 360bbb-3(b)(1), unless the authorization is terminated  or revoked sooner.       Influenza A by PCR NEGATIVE NEGATIVE Final   Influenza B by PCR NEGATIVE NEGATIVE Final    Comment: (NOTE) The Xpert Xpress SARS-CoV-2/FLU/RSV plus assay is intended as an aid in the diagnosis of influenza from Nasopharyngeal swab specimens and should not be used as a sole basis for treatment. Nasal washings and aspirates are unacceptable for Xpert Xpress SARS-CoV-2/FLU/RSV testing.  Fact Sheet for Patients: EntrepreneurPulse.com.au  Fact Sheet for Healthcare Providers: IncredibleEmployment.be  This test is not yet approved or cleared by the [place] FDA and has been authorized for detection and/or diagnosis of SARS-CoV-2 by FDA under an Emergency Use Authorization (EUA). This EUA will remain in effect (meaning this test can be used) for the duration of the COVID-19 declaration under Section 564(b)(1) of the Act, 21 U.S.C. section 360bbb-3(b)(1), unless the authorization is terminated or revoked.  Performed at [facility]  [facility], [address] [place], [place] [address]         Radiology Studies: DG Chest 2 View  Result Date: 04/30/2020 CLINICAL DATA:  Shortness of breath. EXAM: CHEST - 2 VIEW COMPARISON:  None. FINDINGS: Cardiac silhouette is enlarged. There are bibasilar densities, right side greater than left. Evidence for pleural effusions, right side greater than left. Negative for pneumothorax. Atherosclerotic calcifications at the aortic arch. IMPRESSION: Cardiomegaly with bibasilar chest densities. Findings are suggestive for bilateral pleural effusions, right side greater than left. There may be underlying consolidation  or parenchymal disease in the right lung. Electronically Signed   By: Markus Daft M.D.   On: 04/30/2020 15:48        Scheduled Meds: . enoxaparin (LOVENOX) injection  30 mg Subcutaneous Q24H  . furosemide  60 mg Intravenous BID  . hydrALAZINE  25 mg Oral TID  . insulin aspart  0-9 Units Subcutaneous TID WC   Continuous Infusions:   LOS: 0 days     Cordelia Poche, MD [facility] 05/01/2020, 12:13 PM  If 7PM-7AM, please contact night-coverage [email or web address]

## 2020-05-02 ENCOUNTER — Inpatient Hospital Stay: Payer: Medicaid Other

## 2020-05-02 DIAGNOSIS — I313 Pericardial effusion (noninflammatory): Secondary | ICD-10-CM

## 2020-05-02 DIAGNOSIS — I5031 Acute diastolic (congestive) heart failure: Secondary | ICD-10-CM

## 2020-05-02 DIAGNOSIS — I10 Essential (primary) hypertension: Secondary | ICD-10-CM

## 2020-05-02 LAB — GLUCOSE, CAPILLARY
Glucose-Capillary: 84 mg/dL (ref 70–99)
Glucose-Capillary: 121 mg/dL — ABNORMAL HIGH (ref 70–99)
Glucose-Capillary: 167 mg/dL — ABNORMAL HIGH (ref 70–99)

## 2020-05-02 LAB — BODY FLUID CELL COUNT WITH DIFFERENTIAL
Eos, Fluid: 0 %
Lymphs, Fluid: 11 %
Monocyte-Macrophage-Serous Fluid: 48 %
Neutrophil Count, Fluid: 41 %
Total Nucleated Cell Count, Fluid: 260 cu mm

## 2020-05-02 LAB — RENAL FUNCTION PANEL
Albumin: 2.6 g/dL — ABNORMAL LOW (ref 3.5–5.0)
Anion gap: 9 (ref 5–15)
BUN: 79 mg/dL — ABNORMAL HIGH (ref 8–23)
CO2: 20 mmol/L — ABNORMAL LOW (ref 22–32)
Calcium: 8.3 mg/dL — ABNORMAL LOW (ref 8.9–10.3)
Chloride: 113 mmol/L — ABNORMAL HIGH (ref 98–111)
Creatinine, Ser: 2.87 mg/dL — ABNORMAL HIGH (ref 0.44–1.00)
GFR, Estimated: 18 mL/min — ABNORMAL LOW (ref >60)
Glucose, Bld: 88 mg/dL (ref 70–99)
Phosphorus: 5.6 mg/dL — ABNORMAL HIGH (ref 2.5–4.6)
Potassium: 4.6 mmol/L (ref 3.5–5.1)
Sodium: 142 mmol/L (ref 135–145)

## 2020-05-02 LAB — LACTATE DEHYDROGENASE, PLEURAL OR PERITONEAL FLUID: LD, Fluid: 40 U/L — ABNORMAL HIGH (ref 3–23)

## 2020-05-02 LAB — IRON AND TIBC
Iron: 46 ug/dL (ref 28–170)
Saturation Ratios: 26 % (ref 10.4–31.8)
TIBC: 181 ug/dL — ABNORMAL LOW (ref 250–450)
UIBC: 135 ug/dL

## 2020-05-02 LAB — PROTEIN, PLEURAL OR PERITONEAL FLUID: Total protein, fluid: <3 g/dL

## 2020-05-02 LAB — LACTATE DEHYDROGENASE: LDH: 186 U/L (ref 98–192)

## 2020-05-02 LAB — VITAMIN B12: Vitamin B-12: 340 pg/mL (ref 180–914)

## 2020-05-02 LAB — FERRITIN: Ferritin: 260 ng/mL (ref 11–307)

## 2020-05-02 LAB — FOLATE: Folate: 21.2 ng/mL (ref >5.9)

## 2020-05-02 LAB — PROTEIN, TOTAL: Total Protein: 5.6 g/dL — ABNORMAL LOW (ref 6.5–8.1)

## 2020-05-02 MED ORDER — AMLODIPINE BESYLATE 10 MG PO TABS
10.0000 mg | ORAL_TABLET | Freq: Every day | ORAL | Status: DC
  Administered 2020-05-03 – 2020-05-16 (×13): 10 mg via ORAL
  Filled 2020-05-02 (×14): qty 1

## 2020-05-02 MED ORDER — CARVEDILOL 3.125 MG PO TABS
3.1250 mg | ORAL_TABLET | Freq: Two times a day (BID) | ORAL | Status: DC
  Administered 2020-05-02 – 2020-05-03 (×2): 3.125 mg via ORAL
  Filled 2020-05-02 (×2): qty 1

## 2020-05-02 MED ORDER — FUROSEMIDE 10 MG/ML IJ SOLN
80.0000 mg | Freq: Two times a day (BID) | INTRAMUSCULAR | Status: DC
  Administered 2020-05-02 – 2020-05-04 (×4): 80 mg via INTRAVENOUS
  Filled 2020-05-02 (×4): qty 8

## 2020-05-02 NOTE — Consult Note (Signed)
    Heart Failure Nurse Navigator Note  HFpEF 55-60%.  Severe concentric LVH.  Grade 2 diastolic dysfunction.  Right ventricular systolic function is moderately reduced.  Moderately elevated pulmonary artery pressure.  Left atria is mildly dilated.  Right atrium is mildly mildly dilated.  Moderate pericardial effusion.  Tricuspid regurgitation is mild to moderate.  Mild to moderate aortic insufficiency mild to moderate aortic stenosis.   She presented by way of EMS due to dyspnea and generalized weakness.  [place]  He was found to be hypertensive in the ED with a blood pressure of 223/100 and was also hyperkalemic.   Comorbidities:  Diabetes Hypertension Chronic kidney disease stage V  Medications:   Norvasc 5 mg daily, Furosemide 60 mg IV 2 times a day Hydralazine 25 mg 3 times a day  Labs:  Sodium 142, potassium 4.6, chloride 113, CO2 20, BUN 79, creatinine 2.87, WBC 6.8, hemoglobin 8.6, hematocrit 27.8, platelet count 150, Blood pressure 169/75 Weight 91.5 kg BMI 32.56 Intake not documented Output 1975 mL      Assessment:   General-she is awake and alert, complained of migraine headache and just not feeling well.  HEENT-pupils are equal, edentulous.   Cardiac-heart tones of regular rate and rhythm no rubs appreciated  Chest-diminished in the bases.  Abdomen rounded soft nontender  Musculoskeletal-lower extremity edema, +2 pitting  Psych-is pleasant and appropriate.  Neurologic-speech is clear, moves all extremities without problem.     Discussed heart failure with patient.  She states that she is not feeling well today and also complaining of a migraine headache is willing to initiate teaching.  She does not use salt at the table, does admit to eat eating canned soups but  for the most part tries to stick with a low sodium diet.   She states since moving here she does not have family support and is living with  her best friend.  She is concerned about not being able to take care of herself after discharge.  She was given the heart failure teaching booklet along with his own magnet.  At this time she did not want to view the videos with not feeling well but is willing to try tomorrow.   Continue to follow.   Pricilla Riffle RN [facility]

## 2020-05-02 NOTE — Consult Note (Signed)
Cardiology Consultation:   Patient ID: Katie [name] MRN: [id number]; DOB: 10-May-1956  Admit date: 04/30/2020 Date of Consult: 05/02/2020  Primary Care Provider: Patient, [name] [facility] Cardiologist: [facility], Dr. Fletcher Anon rounding [place] Electrophysiologist:  None    Patient Profile:   Katie [name] is a 64 y.o. 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 Lonny Prude.  History of Present Illness:   Katie [name] is a 64 yo female with limited medical history, having just moved here from [place] this past Wednesday.   She reports she does not add salt to her food but does have a diet of processed foods. Chips are next to her bedside during consultation. She drinks 1/2 a gallon daily on estimate.   Over the last month, she reports generalized weakness and progressive shortness of breath and DOE.  She has noted increasing lower extremity edema and abdominal distention.  She also experienced new 2 pillow orthopnea and PND.  She reports that she decided to present to [facility] emergency department after she was unable to get up from her chair.  In the [facility] emergency department, initial vitals significant for hypertension at 158/77.  Initial labs showed hypokalemia with potassium 5.6, glucose 116, creatinine elevated at 3.97 with BUN 83, BNP 769.6, high-sensitivity troponin 24  21, hemoglobin 10.5 with hematocrit 34.4 respiratory panel negative, hemoglobin A1C 4.7.  CXR showed bilateral pleural effusions, R>L.   Today she denies any CP and reports improving SOB/DOE though she is still on [place] oxygen. She does not use oxygen at home.  She reports significant fatigue, having not been able to get very much rest this admission due to the frequency at which providers enter her room.  She reports weakness.  Cardiology and nephrology consulted.   Past Medical History:  Diagnosis Date  .  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[place])   . Hypertension   . Renal disorder   . Type 2 diabetes mellitus ([place])     Past Surgical History:  Procedure Laterality Date  . APPENDECTOMY    . MANDIBLE SURGERY    . RENAL BIOPSY Right      Home Medications:  Prior to Admission medications   Not on File    Inpatient Medications: Scheduled Meds: . amLODipine  5 mg Oral Daily  . enoxaparin (LOVENOX) injection  30 mg Subcutaneous Q24H  . furosemide  60 mg Intravenous BID  . hydrALAZINE  25 mg Oral TID  . insulin aspart  0-9 Units Subcutaneous TID WC  . sodium bicarbonate  650 mg Oral TID   Continuous Infusions:  PRN Meds: acetaminophen **OR** acetaminophen, hydrALAZINE, ondansetron **OR** ondansetron (ZOFRAN) IV  Allergies:   No Known Allergies  Social History:   Social History   Socioeconomic History  . Marital status: Single    Spouse name: Not on file  . Number of children: Not on file  . Years of education: Not on file  . Highest education level: Not on file  Occupational History  . Not on file  Tobacco Use  . Smoking status: Former Research scientist (life sciences)  . Smokeless tobacco: Never Used  Vaping Use  . Vaping Use: Never used  Substance and Sexual Activity  . Alcohol use: Not Currently  . Drug use: Not on file  . Sexual activity: Not on file  Other Topics Concern  . Not  on file  Social History Narrative  . Not on file   Social Determinants of Health   Financial Resource Strain: Not on file  Food Insecurity: Not on file  Transportation Needs: Not on file  Physical Activity: Not on file  Stress: Not on file  Social Connections: Not on file  Intimate Partner Violence: Not on file    Family History:    Family History  Problem Relation Age of Onset  . Heart attack Mother   . Asthma Mother   . Uterine cancer Mother   . Skin cancer Father   . Prostate cancer Father   . CAD Father   . Parkinson's disease Father   . Lung cancer Brother      ROS:  Please see  the history of present illness.  Review of Systems  Constitutional: Positive for malaise/fatigue.  Respiratory: Positive for shortness of breath.   Cardiovascular: Positive for orthopnea, leg swelling and PND. Negative for chest pain and palpitations.  Musculoskeletal: Negative for falls.  Neurological: Positive for weakness.  All other systems reviewed and are negative.   All other ROS reviewed and negative.     Physical Exam/Data:   Vitals:   05/01/20 2150 05/02/20 0512 05/02/20 0728 05/02/20 0756  BP: (!) 183/92 (!) 194/95 (!) 198/97 (!) 188/94  Pulse: 81 79 79 81  Resp:  17  17  Temp: 98.2 F (36.8 C) 98.5 F (36.9 C)  98.1 F (36.7 C)  TempSrc: Oral Oral  Oral  SpO2: 96% 99% 98% 97%  Weight:      Height:        Intake/Output Summary (Last 24 hours) at 05/02/2020 1127 Last data filed at 05/02/2020 1032 Gross per 24 hour  Intake 120 ml  Output 2000 ml  Net -1880 ml   Last 3 Weights 05/01/2020 04/30/2020 04/30/2020  Weight (lbs) 201 lb 11.2 oz 202 lb 1.6 oz 200 lb  Weight (kg) 91.491 kg 91.672 kg 90.719 kg     Body mass index is 32.56 kg/m.  General:  Well nourished, well developed, in no acute distress HEENT: normal Lymph: no adenopathy Neck: Elevated JVP Endocrine:  No thryomegaly Vascular: No carotid bruits; FA pulses 2+ bilaterally without bruits  Cardiac:  normal S1, S2; RRR; distant heart sounds. 2/6 systolic murmur best appreciated RUSB. Unable to lean forward to assess if friction rub.  Lungs: Bibasilar reduced breath sounds right worse than left, no wheezing, rhonchi or rales  Abd: soft, nontender, no hepatomegaly  Ext: Moderate to 1+ bilateral lower extremity edema Musculoskeletal:  No deformities, BUE and BLE strength normal and equal Skin: warm and dry  Neuro:  CNs 2-12 intact, no focal abnormalities noted Psych:  Normal affect   EKG:  The EKG was personally reviewed and demonstrates:  NSR, 87 bpm, poor R wave in inferior II, II, aVF, poor R wave  progression in precordial leads V1 -V4 Telemetry:  Telemetry was personally reviewed and demonstrates:  NSR  Relevant CV Studies: Echo 05/01/20 Read by outside cardiologist  1. Left ventricular ejection fraction, by estimation, is 55 to 60%. The  left ventricle has normal function. The left ventricle has no regional  wall motion abnormalities. There is severe concentric left ventricular  hypertrophy. Left ventricular diastolic  parameters are consistent with Grade II diastolic dysfunction  (pseudonormalization).  2. Right ventricular systolic function is moderately reduced. The right  ventricular size is normal. Mildly increased right ventricular wall  thickness. There is moderately elevated pulmonary artery systolic  pressure.  3. Left atrial size was mildly dilated.  4. Right atrial size was mildly dilated.  5. Moderate pericardial effusion. The pericardial effusion is  circumferential.  6. The mitral valve is abnormal. Mild to moderate mitral valve  regurgitation.  7. The tricuspid valve is abnormal. Tricuspid valve regurgitation is mild  to moderate.  8. The aortic valve is abnormal. Aortic valve regurgitation is mild to  moderate. Mild to moderate aortic valve stenosis.   Laboratory Data:  High Sensitivity Troponin:   Recent Labs  Lab 04/30/20 1601 04/30/20 2114  TROPONINIHS 24* 21*     Chemistry Recent Labs  Lab 04/30/20 1158 05/01/20 0519 05/02/20 0507  NA 143 141 142  K 5.6* 5.0 4.6  CL 115* 114* 113*  CO2 20* 20* 20*  GLUCOSE 116* 85 88  BUN 83* 84* 79*  CREATININE 2.97* 2.93* 2.87*  CALCIUM 8.5* 8.1* 8.3*  GFRNONAA 17* 18* 18*  ANIONGAP 8 7 9     Recent Labs  Lab 04/30/20 1601 05/02/20 0507  PROT 6.6  --   ALBUMIN 3.2* 2.6*  AST 20  --   ALT 26  --   ALKPHOS 68  --   BILITOT 0.9  --    Hematology Recent Labs  Lab 04/30/20 1158 05/01/20 0519 05/01/20 1802  WBC 9.2 6.8  --   RBC 3.38* 2.75* 3.03*  HGB 10.5* 8.6*  --   HCT 34.4*  27.8*  --   MCV 101.8* 101.1*  --   MCH 31.1 31.3  --   MCHC 30.5 30.9  --   RDW 14.6 14.4  --   PLT 197 150  --    BNP Recent Labs  Lab 04/30/20 1601  BNP 769.6*    DDimer No results for input(s): DDIMER in the last 168 hours.   Radiology/Studies:  DG Chest 2 View  Result Date: 05/01/2020 CLINICAL DATA:  Former smoker.  Congestive heart failure. EXAM: CHEST - 2 VIEW COMPARISON:  April 30, 2020 FINDINGS: The right-sided pleural effusion and underlying opacity is stable. Stable cardiomegaly. The hila and mediastinum are unchanged. No pneumothorax. No other interval changes. IMPRESSION: Persistent right pleural effusion and underlying opacity, similar in the interval. Electronically Signed   By: Dorise Bullion III M.D   On: 05/01/2020 12:44   DG Chest 2 View  Result Date: 04/30/2020 CLINICAL DATA:  Shortness of breath. EXAM: CHEST - 2 VIEW COMPARISON:  None. FINDINGS: Cardiac silhouette is enlarged. There are bibasilar densities, right side greater than left. Evidence for pleural effusions, right side greater than left. Negative for pneumothorax. Atherosclerotic calcifications at the aortic arch. IMPRESSION: Cardiomegaly with bibasilar chest densities. Findings are suggestive for bilateral pleural effusions, right side greater than left. There may be underlying consolidation or parenchymal disease in the right lung. Electronically Signed   By: Markus Daft M.D.   On: 04/30/2020 15:48   US RENAL  Result Date: 05/01/2020 CLINICAL DATA:  Active tubular necrosis. EXAM: RENAL / URINARY TRACT ULTRASOUND COMPLETE COMPARISON:  None. FINDINGS: Right Kidney: Renal measurements: 11.1 x 5.2 x 5.1 cm = volume: 155 mL. There is no hydronephrosis. There is increased cortical echogenicity. There is a cyst in the interpolar region of the kidney. Left Kidney: Renal measurements: 11.1 x 6 x 5.4 cm = volume: 187 mL. There is no hydronephrosis. There is increased cortical echogenicity. Bladder: There is  debris within the urinary bladder. Both ureteral jets were visualized. Other: Incidentally noted was a right-sided pleural effusion. IMPRESSION: 1. No  hydronephrosis. 2. Echogenic kidneys which can be seen in patients with medical renal disease. 3. Incidentally noted right-sided pleural effusion. 4. Debris within the urinary bladder. This is nonspecific and should be correlated with urinalysis. Electronically Signed   By: Constance Holster M.D.   On: 05/01/2020 21:07   ECHOCARDIOGRAM COMPLETE  Result Date: 05/01/2020    ECHOCARDIOGRAM REPORT   Patient Name:   [facility] Date of Exam: 05/01/2020 Medical Rec #:  [id number]    Height:       66.0 in Accession #:    [id number]   Weight:       201.7 lb Date of Birth:  01/27/1956   BSA:          2.007 m Patient Age:    62 years     BP:           158/77 mmHg Patient Gender: F            HR:           83 bpm. Exam Location:  [facility] Procedure: 2D Echo Indications:     CHF  History:         Patient has no prior history of Echocardiogram examinations.                  CHF; Risk Factors:Hypertension.  Sonographer:     L Thornton-Maynard Referring Phys:  [id number] [place] Diagnosing Phys: Neoma Laming MD IMPRESSIONS  1. Left ventricular ejection fraction, by estimation, is 55 to 60%. The left ventricle has normal function. The left ventricle has no regional wall motion abnormalities. There is severe concentric left ventricular hypertrophy. Left ventricular diastolic  parameters are consistent with Grade II diastolic dysfunction (pseudonormalization).  2. Right ventricular systolic function is moderately reduced. The right ventricular size is normal. Mildly increased right ventricular wall thickness. There is moderately elevated pulmonary artery systolic pressure.  3. Left atrial size was mildly dilated.  4. Right atrial size was mildly dilated.  5. Moderate pericardial effusion. The pericardial effusion is circumferential.  6. The mitral valve is abnormal. Mild to  moderate mitral valve regurgitation.  7. The tricuspid valve is abnormal. Tricuspid valve regurgitation is mild to moderate.  8. The aortic valve is abnormal. Aortic valve regurgitation is mild to moderate. Mild to moderate aortic valve stenosis. FINDINGS  Left Ventricle: Left ventricular ejection fraction, by estimation, is 55 to 60%. The left ventricle has normal function. The left ventricle has no regional wall motion abnormalities. The left ventricular internal cavity size was small. There is severe concentric left ventricular hypertrophy. Left ventricular diastolic parameters are consistent with Grade II diastolic dysfunction (pseudonormalization). Right Ventricle: The right ventricular size is normal. Mildly increased right ventricular wall thickness. Right ventricular systolic function is moderately reduced. There is moderately elevated pulmonary artery systolic pressure. The tricuspid regurgitant velocity is 3.47 m/s, and with an assumed right atrial pressure of 3 mmHg, the estimated right ventricular systolic pressure is 83.4 mmHg. Left Atrium: Left atrial size was mildly dilated. Right Atrium: Right atrial size was mildly dilated. Pericardium: A moderately sized pericardial effusion is present. The pericardial effusion is circumferential. Mitral Valve: The mitral valve is abnormal. Mild to moderate mitral valve regurgitation. MV peak gradient, 11.8 mmHg. The mean mitral valve gradient is 6.0 mmHg. Tricuspid Valve: The tricuspid valve is abnormal. Tricuspid valve regurgitation is mild to moderate. Aortic Valve: The aortic valve is abnormal. Aortic valve regurgitation is mild to moderate. Aortic regurgitation PHT measures 334 msec.  Mild to moderate aortic stenosis is present. Aortic valve mean gradient measures 16.0 mmHg. Aortic valve peak gradient  measures 38.7 mmHg. Aortic valve area, by VTI measures 1.87 cm. Pulmonic Valve: The pulmonic valve was normal in structure. Pulmonic valve regurgitation is  trivial. Aorta: The aortic root was not well visualized. IAS/Shunts: No atrial level shunt detected by color flow Doppler.  LEFT VENTRICLE PLAX 2D LVIDd:         4.63 cm  Diastology LVIDs:         3.04 cm  LV e' medial:    5.87 cm/s LV PW:         1.08 cm  LV E/e' medial:  20.0 LV IVS:        1.61 cm  LV e' lateral:   6.74 cm/s LVOT diam:     2.20 cm  LV E/e' lateral: 17.4 LV SV:         105 LV SV Index:   52 LVOT Area:     3.80 cm  RIGHT VENTRICLE RV S prime:     12.50 cm/s TAPSE (M-mode): 2.3 cm LEFT ATRIUM             Index       RIGHT ATRIUM           Index LA diam:        3.30 cm 1.64 cm/m  RA Area:     15.50 cm LA Vol (A2C):   66.9 ml 33.33 ml/m RA Volume:   39.50 ml  19.68 ml/m LA Vol (A4C):   73.6 ml 36.67 ml/m LA Biplane Vol: 73.0 ml 36.37 ml/m  AORTIC VALVE                    PULMONIC VALVE AV Area (Vmax):    1.44 cm     PV Vmax:       0.96 m/s AV Area (Vmean):   1.75 cm     PV Peak grad:  3.7 mmHg AV Area (VTI):     1.87 cm AV Vmax:           311.00 cm/s AV Vmean:          187.000 cm/s AV VTI:            0.563 m AV Peak Grad:      38.7 mmHg AV Mean Grad:      16.0 mmHg LVOT Vmax:         118.00 cm/s LVOT Vmean:        85.900 cm/s LVOT VTI:          0.277 m LVOT/AV VTI ratio: 0.49 AI PHT:            334 msec  AORTA Ao Root diam: 3.20 cm MITRAL VALVE                TRICUSPID VALVE MV Area (PHT): 3.86 cm     TR Peak grad:   48.2 mmHg MV Peak grad:  11.8 mmHg    TR Vmax:        347.00 cm/s MV Mean grad:  6.0 mmHg MV Vmax:       1.72 m/s     SHUNTS MV Vmean:      119.0 cm/s   Systemic VTI:  0.28 m MV E velocity: 117.50 cm/s  Systemic Diam: 2.20 cm MV A velocity: 114.50 cm/s MV E/A ratio:  1.03 Neoma Laming MD Electronically signed by Neoma Laming MD Signature  Date/Time: 05/01/2020/10:11:01 PM    Final      Assessment and Plan:   AOC HFpEF --Reports symptoms of worsening volume overload over the last month.  Volume up on exam.  Volume status likely exacerbated by poor BP control and high salt  diet. Recent echo shows EF of 55 to 60%, NRWMA.  Severe LV hypertrophy G2 DD.  RV systolic function moderately reduced, mild RVH, moderately elevated PASP, mild LAE/RAE, moderate circumferential pericardial effusion, mild to moderate MR, mild TR, mild AI, mild to moderate AS.  Continue IV diuresis with IV Lasix 60 mg twice daily.  Continue to monitor I's/O's, daily standing weights.  Net -1.975L on 12/19. Wt 91.5kg.  Daily BMET. Monitor electrolytes.  Most recent Cr 2.87 with BUN 79. K 4.6.   CHF education. ReDS vest.   Pericardial effusion --Echo read by an outside cardiologist. She reported she was too weak to lean forward to appreciate if friction rub. Exam not consistent with tamponade. Consider that her renal disease could be the etiology of her effusion. Will consult with our rounding team regarding size of circumferential pericardial effusion with further recommendations regarding treatment and further workup if indicated at that time. If suspect infection of malignancy, or if large effusion of >2cm, consider pericardiocentesis at that time.   Hypertension, poorly controlled --Continue IV lasix. --Increased amlodipine to 10mg  daily, given ongoing BP control needed.  --Continue hydralazine 25mg  and escalate as tolerated for BP control. Continue PRN IV hydralazine.            New [place] Heart Association (NYHA) Functional Class NYHA Class III        For questions or updates, please contact [facility] Please consult [email or web address] for contact info under    Signed, Arvil Chaco, PA-C  05/02/2020 11:27 AM

## 2020-05-02 NOTE — Progress Notes (Signed)
[place], [name] 05/02/20  Subjective:   Hospital day # 1  Doing fair today.  Diagnosed with moderate pericardial effusion, mild to moderate aortic insufficiency and aortic stenosis. Reports multiple stressors in family life Moved to [place] last week Her main concern is weakness in her legs  Renal: 12/19 0701 - 12/20 0700 In: -  Out: 1975 [Urine:1975] Lab Results  Component Value Date   CREATININE 2.87 (H) 05/02/2020   CREATININE 2.93 (H) 05/01/2020   CREATININE 2.97 (H) 04/30/2020     Objective:  Vital signs in last 24 hours:  Temp:  [98.1 F (36.7 C)-98.5 F (36.9 C)] 98.5 F (36.9 C) (12/20 1952) Pulse Rate:  [73-81] 73 (12/20 1952) Resp:  [16-17] 16 (12/20 1952) BP: (164-198)/(75-97) 164/83 (12/20 1952) SpO2:  [94 %-99 %] 94 % (12/20 1952)  Weight change:  Filed Weights   04/30/20 1151 04/30/20 2045 05/01/20 0327  Weight: 90.7 kg 91.7 kg 91.5 kg    Intake/Output:    Intake/Output Summary (Last 24 hours) at 05/02/2020 2126 Last data filed at 05/02/2020 1953 Gross per 24 hour  Intake 360 ml  Output 2050 ml  Net -1690 ml     Physical Exam: General:  Chronically ill-appearing, laying in the bed  HEENT  anicteric, moist oral mucous membranes  Pulm/lungs  normal breathing effort  CVS/Heart  regular, no rub  Abdomen:   Soft, nontender  Extremities: + Dependent edema  Neurologic:  Alert, able to answer questions  Skin:  No acute rashes    Basic Metabolic Panel:  Recent Labs  Lab 04/30/20 1158 05/01/20 0519 05/02/20 0507  NA 143 141 142  K 5.6* 5.0 4.6  CL 115* 114* 113*  CO2 20* 20* 20*  GLUCOSE 116* 85 88  BUN 83* 84* 79*  CREATININE 2.97* 2.93* 2.87*  CALCIUM 8.5* 8.1* 8.3*  PHOS  --   --  5.6*     CBC: Recent Labs  Lab 04/30/20 1158 05/01/20 0519  WBC 9.2 6.8  HGB 10.5* 8.6*  HCT 34.4* 27.8*  MCV 101.8* 101.1*  PLT 197 150     No results found for: HEPBSAG, HEPBSAB,  HEPBIGM    Microbiology:  Recent Results (from the past 240 hour(s))  Resp Panel by RT-PCR (Flu A&B, Covid) Nasopharyngeal Swab     Status: None   Collection Time: 04/30/20  4:01 PM   Specimen: Nasopharyngeal Swab; Nasopharyngeal(NP) swabs in vial transport medium  Result Value Ref Range Status   SARS Coronavirus 2 by RT PCR NEGATIVE NEGATIVE Final    Comment: (NOTE) SARS-CoV-2 target nucleic acids are NOT DETECTED.  The SARS-CoV-2 RNA is generally detectable in upper respiratory specimens during the acute phase of infection. The lowest concentration of SARS-CoV-2 viral copies this assay can detect is 138 copies/mL. A negative result does not preclude SARS-Cov-2 infection and should not be used as the sole basis for treatment or other patient management decisions. A negative result may occur with  improper specimen collection/handling, submission of specimen other than nasopharyngeal swab, presence of viral mutation(s) within the areas targeted by this assay, and inadequate number of viral copies(<138 copies/mL). A negative result must be combined with clinical observations, patient history, and epidemiological information. The expected result is Negative.  Fact Sheet for Patients:  EntrepreneurPulse.com.au  Fact Sheet for Healthcare Providers:  IncredibleEmployment.be  This test is no t yet approved or cleared by the [place] FDA and  has been authorized for detection and/or diagnosis of SARS-CoV-2  by FDA under an Emergency Use Authorization (EUA). This EUA will remain  in effect (meaning this test can be used) for the duration of the COVID-19 declaration under Section 564(b)(1) of the Act, 21 U.S.C.section 360bbb-3(b)(1), unless the authorization is terminated  or revoked sooner.       Influenza A by PCR NEGATIVE NEGATIVE Final   Influenza B by PCR NEGATIVE NEGATIVE Final    Comment: (NOTE) The Xpert Xpress SARS-CoV-2/FLU/RSV  plus assay is intended as an aid in the diagnosis of influenza from Nasopharyngeal swab specimens and should not be used as a sole basis for treatment. Nasal washings and aspirates are unacceptable for Xpert Xpress SARS-CoV-2/FLU/RSV testing.  Fact Sheet for Patients: EntrepreneurPulse.com.au  Fact Sheet for Healthcare Providers: IncredibleEmployment.be  This test is not yet approved or cleared by the [place] FDA and has been authorized for detection and/or diagnosis of SARS-CoV-2 by FDA under an Emergency Use Authorization (EUA). This EUA will remain in effect (meaning this test can be used) for the duration of the COVID-19 declaration under Section 564(b)(1) of the Act, 21 U.S.C. section 360bbb-3(b)(1), unless the authorization is terminated or revoked.  Performed at [facility], [address] [place], [place] [address]     Coagulation Studies: No results for input(s): LABPROT, INR in the last 72 hours.  Urinalysis: No results for input(s): COLORURINE, LABSPEC, PHURINE, GLUCOSEU, HGBUR, BILIRUBINUR, KETONESUR, PROTEINUR, UROBILINOGEN, NITRITE, LEUKOCYTESUR in the last 72 hours.  Invalid input(s): APPERANCEUR    Imaging: DG Chest 1 View  Result Date: 05/02/2020 CLINICAL DATA:  Follow-up pleural effusions status post thoracentesis. EXAM: CHEST  1 VIEW COMPARISON:  05/01/2020 FINDINGS: Stable cardiomediastinal contours. Decreased volume of right pleural effusion status post thoracentesis. No pneumothorax identified. Stable appearance of left pleural effusion. IMPRESSION: Decreased volume of right pleural effusion status post thoracentesis. No pneumothorax. Electronically Signed   By: Kerby Moors M.D.   On: 05/02/2020 17:15   DG Chest 2 View  Result Date: 05/01/2020 CLINICAL DATA:  Former smoker.  Congestive heart failure. EXAM: CHEST - 2 VIEW COMPARISON:  April 30, 2020 FINDINGS: The right-sided pleural effusion and  underlying opacity is stable. Stable cardiomegaly. The hila and mediastinum are unchanged. No pneumothorax. No other interval changes. IMPRESSION: Persistent right pleural effusion and underlying opacity, similar in the interval. Electronically Signed   By: Dorise Bullion III M.D   On: 05/01/2020 12:44   US RENAL  Result Date: 05/01/2020 CLINICAL DATA:  Active tubular necrosis. EXAM: RENAL / URINARY TRACT ULTRASOUND COMPLETE COMPARISON:  None. FINDINGS: Right Kidney: Renal measurements: 11.1 x 5.2 x 5.1 cm = volume: 155 mL. There is no hydronephrosis. There is increased cortical echogenicity. There is a cyst in the interpolar region of the kidney. Left Kidney: Renal measurements: 11.1 x 6 x 5.4 cm = volume: 187 mL. There is no hydronephrosis. There is increased cortical echogenicity. Bladder: There is debris within the urinary bladder. Both ureteral jets were visualized. Other: Incidentally noted was a right-sided pleural effusion. IMPRESSION: 1. No hydronephrosis. 2. Echogenic kidneys which can be seen in patients with medical renal disease. 3. Incidentally noted right-sided pleural effusion. 4. Debris within the urinary bladder. This is nonspecific and should be correlated with urinalysis. Electronically Signed   By: Constance Holster M.D.   On: 05/01/2020 21:07   ECHOCARDIOGRAM COMPLETE  Result Date: 05/01/2020    ECHOCARDIOGRAM REPORT   Patient Name:   [facility] Date of Exam: 05/01/2020 Medical Rec #:  [id number]    Height:  66.0 in Accession #:    [id number]   Weight:       201.7 lb Date of Birth:  Jul 30, 1955   BSA:          2.007 m Patient Age:    43 years     BP:           158/77 mmHg Patient Gender: F            HR:           83 bpm. Exam Location:  [facility] Procedure: 2D Echo Indications:     CHF  History:         Patient has no prior history of Echocardiogram examinations.                  CHF; Risk Factors:Hypertension.  Sonographer:     L Thornton-Maynard Referring Phys:  [id number] [place] Diagnosing Phys: Neoma Laming MD IMPRESSIONS  1. Left ventricular ejection fraction, by estimation, is 55 to 60%. The left ventricle has normal function. The left ventricle has no regional wall motion abnormalities. There is severe concentric left ventricular hypertrophy. Left ventricular diastolic  parameters are consistent with Grade II diastolic dysfunction (pseudonormalization).  2. Right ventricular systolic function is moderately reduced. The right ventricular size is normal. Mildly increased right ventricular wall thickness. There is moderately elevated pulmonary artery systolic pressure.  3. Left atrial size was mildly dilated.  4. Right atrial size was mildly dilated.  5. Moderate pericardial effusion. The pericardial effusion is circumferential.  6. The mitral valve is abnormal. Mild to moderate mitral valve regurgitation.  7. The tricuspid valve is abnormal. Tricuspid valve regurgitation is mild to moderate.  8. The aortic valve is abnormal. Aortic valve regurgitation is mild to moderate. Mild to moderate aortic valve stenosis. FINDINGS  Left Ventricle: Left ventricular ejection fraction, by estimation, is 55 to 60%. The left ventricle has normal function. The left ventricle has no regional wall motion abnormalities. The left ventricular internal cavity size was small. There is severe concentric left ventricular hypertrophy. Left ventricular diastolic parameters are consistent with Grade II diastolic dysfunction (pseudonormalization). Right Ventricle: The right ventricular size is normal. Mildly increased right ventricular wall thickness. Right ventricular systolic function is moderately reduced. There is moderately elevated pulmonary artery systolic pressure. The tricuspid regurgitant velocity is 3.47 m/s, and with an assumed right atrial pressure of 3 mmHg, the estimated right ventricular systolic pressure is 30.8 mmHg. Left Atrium: Left atrial size was mildly dilated. Right Atrium: Right  atrial size was mildly dilated. Pericardium: A moderately sized pericardial effusion is present. The pericardial effusion is circumferential. Mitral Valve: The mitral valve is abnormal. Mild to moderate mitral valve regurgitation. MV peak gradient, 11.8 mmHg. The mean mitral valve gradient is 6.0 mmHg. Tricuspid Valve: The tricuspid valve is abnormal. Tricuspid valve regurgitation is mild to moderate. Aortic Valve: The aortic valve is abnormal. Aortic valve regurgitation is mild to moderate. Aortic regurgitation PHT measures 334 msec. Mild to moderate aortic stenosis is present. Aortic valve mean gradient measures 16.0 mmHg. Aortic valve peak gradient  measures 38.7 mmHg. Aortic valve area, by VTI measures 1.87 cm. Pulmonic Valve: The pulmonic valve was normal in structure. Pulmonic valve regurgitation is trivial. Aorta: The aortic root was not well visualized. IAS/Shunts: No atrial level shunt detected by color flow Doppler.  LEFT VENTRICLE PLAX 2D LVIDd:         4.63 cm  Diastology LVIDs:  3.04 cm  LV e' medial:    5.87 cm/s LV PW:         1.08 cm  LV E/e' medial:  20.0 LV IVS:        1.61 cm  LV e' lateral:   6.74 cm/s LVOT diam:     2.20 cm  LV E/e' lateral: 17.4 LV SV:         105 LV SV Index:   52 LVOT Area:     3.80 cm  RIGHT VENTRICLE RV S prime:     12.50 cm/s TAPSE (M-mode): 2.3 cm LEFT ATRIUM             Index       RIGHT ATRIUM           Index LA diam:        3.30 cm 1.64 cm/m  RA Area:     15.50 cm LA Vol (A2C):   66.9 ml 33.33 ml/m RA Volume:   39.50 ml  19.68 ml/m LA Vol (A4C):   73.6 ml 36.67 ml/m LA Biplane Vol: 73.0 ml 36.37 ml/m  AORTIC VALVE                    PULMONIC VALVE AV Area (Vmax):    1.44 cm     PV Vmax:       0.96 m/s AV Area (Vmean):   1.75 cm     PV Peak grad:  3.7 mmHg AV Area (VTI):     1.87 cm AV Vmax:           311.00 cm/s AV Vmean:          187.000 cm/s AV VTI:            0.563 m AV Peak Grad:      38.7 mmHg AV Mean Grad:      16.0 mmHg LVOT Vmax:          118.00 cm/s LVOT Vmean:        85.900 cm/s LVOT VTI:          0.277 m LVOT/AV VTI ratio: 0.49 AI PHT:            334 msec  AORTA Ao Root diam: 3.20 cm MITRAL VALVE                TRICUSPID VALVE MV Area (PHT): 3.86 cm     TR Peak grad:   48.2 mmHg MV Peak grad:  11.8 mmHg    TR Vmax:        347.00 cm/s MV Mean grad:  6.0 mmHg MV Vmax:       1.72 m/s     SHUNTS MV Vmean:      119.0 cm/s   Systemic VTI:  0.28 m MV E velocity: 117.50 cm/s  Systemic Diam: 2.20 cm MV A velocity: 114.50 cm/s MV E/A ratio:  1.03 Neoma Laming MD Electronically signed by Neoma Laming MD Signature Date/Time: 05/01/2020/10:11:01 PM    Final    US THORACENTESIS ASP PLEURAL SPACE W/IMG GUIDE  Result Date: 05/02/2020 INDICATION: 64 year old with a right pleural effusion. Request for thoracentesis. EXAM: ULTRASOUND GUIDED RIGHT THORACENTESIS MEDICATIONS: None. COMPLICATIONS: None immediate. PROCEDURE: An ultrasound guided thoracentesis was thoroughly discussed with the patient and questions answered. The benefits, risks, alternatives and complications were also discussed. The patient understands and wishes to proceed with the procedure. Written consent was obtained. Ultrasound was performed to localize and mark an adequate pocket of  fluid in the right chest. The area was then prepped and draped in the normal sterile fashion. 1% Lidocaine was used for local anesthesia. Under ultrasound guidance a 6 Fr Safe-T-Centesis catheter was introduced. Thoracentesis was performed. The catheter was removed and a dressing applied. FINDINGS: A total of approximately 1.6 L of yellow fluid was removed. Samples were sent to the laboratory as requested by the clinical team. IMPRESSION: Successful ultrasound guided right thoracentesis yielding 1.6 L of pleural fluid. Electronically Signed   By: Markus Daft M.D.   On: 05/02/2020 17:13     Medications:    . [START ON 05/03/2020] amLODipine  10 mg Oral Daily  . carvedilol  3.125 mg Oral BID WC  .  enoxaparin (LOVENOX) injection  30 mg Subcutaneous Q24H  . furosemide  80 mg Intravenous BID  . hydrALAZINE  25 mg Oral TID  . insulin aspart  0-9 Units Subcutaneous TID WC  . sodium bicarbonate  650 mg Oral TID   acetaminophen **OR** acetaminophen, hydrALAZINE, ondansetron **OR** ondansetron (ZOFRAN) IV  Assessment/ Plan:  64 y.o. female with heart failure with preserved EF, hypertension, CKD, stroke   admitted on 04/30/2020 for Chronic kidney disease (CKD), stage IV (severe) (HCC) [N18.4] Volume overload [E87.70]  #Acute kidney injury on chronic kidney disease unspecified.  Probably underlying stage IV CKD Current creatinine of 2.87, GFR 18 Awaiting records from previous nephrology evaluation in [place].  Renal ultrasound shows echogenic kidneys  #Severe hypertension, lower extremity edema 2D echo with LVEF 55 to 60%, moderately elevated pulmonary artery systolic pressures, moderate pericardial effusion, Mild to moderate aortic stenosis  currently managed with IV furosemide 80 mg twice a   LOS: [place] 12/20/20219:26 PM  [place], [place]  Note: This note was prepared with Dragon dictation. Any transcription errors are unintentional

## 2020-05-02 NOTE — Procedures (Signed)
Interventional Radiology Procedure:   Indications: Right pleural effusion  Procedure: US guided thoracentesis  Findings: Removed 1600 ml from right chest.  Complications: None     EBL: Less than 10 ml  Plan: Follow up CXR   [place] R. Anselm Pancoast, MD  Pager: [phone]

## 2020-05-02 NOTE — Progress Notes (Addendum)
PROGRESS NOTE    Katie [name]  MLY:[id number] DOB: 11-02-1955 DOA: 04/30/2020 PCP: Patient, [name]   Brief Narrative: Katie [name] is a 64 y.o. female with a history of diabetes mellitus type 2, stage IV CKD, heart failure, hypertension. Patient presented secondary to dyspnea with evidence of volume overload and pleural effusion.   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 Cardiomegaly on chest x-ray. Started on Lasix IV on admission. -Continue Lasix 60 mg IV BID  Pleural effusion Unsure of etiology but in setting of fluid overload with new diagnosis of grade 2 diastolic heart failure. No previous history of pleural effusion. -Diagnostic thoracentesis  Diabetes mellitus, type 2 Hemoglobin A1C of 4.7%. No need for pharmacologic treatment.  CKD stage V Unknown baseline. Patient is unsure of her primary nephrologist in [place]. Creatinine of 2.97 on admission. Per patient, she was planned to start hemodialysis in addition to consideration for renal transplant. Nephrology consulted on 12/20 -Nephrology recommendations  Acute diastolic heart failure New diagnosis. Grade 2 dysfunction. Normal EF. -Cardiology consult -Daily weights/strict in and out -Lasix as mentioned above  Moderate pericardial effusion No clinical evidence of tamponade. No recent infections. In setting of CKD V with elevated BUN. No chest pain. -Cardiology consult  Mild/moderate aortic stenosis Unsure if this is symptomatic. Patient does have dyspnea with exertion but this may be multifactorial. -Cardiology recommendations  Primary hypertension Patient is on at least hydrochlorothiazide as an outpatient but is unsure of her regimen. Currently  uncontrolled. -Amlodipine 5 mg daily; continue Lasix diuresis -Add hydralazine 10 mg q6 hours prn  Anemia Possible related to underlying kidney disease. Evidence of macrocytosis on CBC. -Iron, TIBC, ferritin -Vitamin B12/folate  Hyperkalemia In setting of CKD. Lokelma 10 g given with improvement.  Pressure injury Mid coccyx, POA  History of tobacco use Patient has quit for at least 10 years. Previously, 20 pack year history.  Obesity Body mass index is 32.56 kg/m.   DVT prophylaxis: Lovenox Code Status:   Code Status: Full Code Family Communication: None at bedside Disposition Plan: Discharge home likely in 2-5 days pending diuresis   Consultants:   Nephrology  Cardiology  Procedures:   TRANSTHORACIC ECHOCARDIOGRAM (05/01/2020) IMPRESSIONS    1. Left ventricular ejection fraction, by estimation, is 55 to 60%. The  left ventricle has normal function. The left ventricle has no regional  wall motion abnormalities. There is severe concentric left ventricular  hypertrophy. Left ventricular diastolic  parameters are consistent with Grade II diastolic dysfunction  (pseudonormalization).  2. Right ventricular systolic function is moderately reduced. The right  ventricular size is normal. Mildly increased right ventricular wall  thickness. There is moderately elevated pulmonary artery systolic  pressure.  3. Left atrial size was mildly dilated.  4. Right atrial size was mildly dilated.  5. Moderate pericardial effusion. The pericardial effusion is  circumferential.  6. The mitral valve is abnormal. Mild to moderate mitral valve  regurgitation.  7. The tricuspid valve is abnormal. Tricuspid valve regurgitation is mild  to moderate.  8. The aortic valve is abnormal. Aortic valve regurgitation is mild to  moderate. Mild to moderate aortic valve stenosis.  Antimicrobials:  None    Subjective: Still with dyspnea.  Dyspnea is significant with exertion. She  reports significant worsening of her dyspnea from last year where she was much more active and mobile.  Objective: Vitals:   05/01/20 2150 05/02/20 0512 05/02/20 0728 05/02/20 0756  BP: (!) 183/92 (!) 194/95 (!) 198/97 (!) 188/94  Pulse: 81 79 79 81  Resp:  17  17  Temp: 98.2 F (36.8 C) 98.5 F (36.9 C)  98.1 F (36.7 C)  TempSrc: Oral Oral  Oral  SpO2: 96% 99% 98% 97%  Weight:      Height:        Intake/Output Summary (Last 24 hours) at 05/02/2020 1042 Last data filed at 05/02/2020 1032 Gross per 24 hour  Intake 120 ml  Output 1400 ml  Net -1280 ml   Filed Weights   04/30/20 1151 04/30/20 2045 05/01/20 0327  Weight: 90.7 kg 91.7 kg 91.5 kg    Examination:  General exam: Appears calm and comfortable Respiratory system: Rales bilaterally and diminished. Respiratory effort normal. Cardiovascular system: S1 & S2 heard, RRR. 2/6 systolic murmur Gastrointestinal system: Abdomen is nondistended, soft and nontender. No organomegaly or masses felt. Normal bowel sounds heard. Central nervous system: Alert and oriented. No focal neurological deficits. Musculoskeletal: No edema. No calf tenderness Skin: No cyanosis. No rashes Psychiatry: Judgement and insight appear normal. Mood & affect appropriate.     Data Reviewed: I have personally reviewed following labs and imaging studies  CBC Lab Results  Component Value Date   WBC 6.8 05/01/2020   RBC 3.03 (L) 05/01/2020   HGB 8.6 (L) 05/01/2020   HCT 27.8 (L) 05/01/2020   MCV 101.1 (H) 05/01/2020   MCH 31.3 05/01/2020   PLT 150 05/01/2020   MCHC 30.9 05/01/2020   RDW 14.4 53/29/9242     Last metabolic panel Lab Results  Component Value Date   NA 142 05/02/2020   K 4.6 05/02/2020   CL 113 (H) 05/02/2020   CO2 20 (L) 05/02/2020   BUN 79 (H) 05/02/2020   CREATININE 2.87 (H) 05/02/2020   GLUCOSE 88 05/02/2020   GFRNONAA 18 (L) 05/02/2020   CALCIUM 8.3 (L) 05/02/2020   PHOS 5.6 (H) 05/02/2020   PROT 6.6 04/30/2020    ALBUMIN 2.6 (L) 05/02/2020   BILITOT 0.9 04/30/2020   ALKPHOS 68 04/30/2020   AST 20 04/30/2020   ALT 26 04/30/2020   ANIONGAP 9 05/02/2020    CBG (last 3)  Recent Labs    05/01/20 1650 05/01/20 2045 05/02/20 0756  GLUCAP 115* 108* 84     GFR: Estimated Creatinine Clearance: 23.5 mL/min (A) (by C-G formula based on SCr of 2.87 mg/dL (H)).  Coagulation Profile: No results for input(s): INR, PROTIME in the last 168 hours.  Recent Results (from the past 240 hour(s))  Resp Panel by RT-PCR (Flu A&B, Covid) Nasopharyngeal Swab     Status: None   Collection Time: 04/30/20  4:01 PM   Specimen: Nasopharyngeal Swab; Nasopharyngeal(NP) swabs in vial transport medium  Result Value Ref Range Status   SARS Coronavirus 2 by RT PCR NEGATIVE NEGATIVE Final    Comment: (NOTE) SARS-CoV-2 target nucleic acids are NOT DETECTED.  The SARS-CoV-2 RNA is generally detectable in upper respiratory specimens during the acute phase of infection. The lowest concentration of SARS-CoV-2 viral copies this assay can detect is 138 copies/mL. A negative result does not preclude SARS-Cov-2 infection and should not be used as the sole basis for treatment or other patient management decisions. A negative result may occur with  improper specimen collection/handling, submission of specimen other than nasopharyngeal swab, presence of viral mutation(s) within the areas targeted by this assay, and inadequate number of viral copies(<138 copies/mL). A negative result must be combined with clinical observations, patient history, and epidemiological information. The expected result is Negative.  Fact Sheet for Patients:  EntrepreneurPulse.com.au  Fact Sheet for Healthcare Providers:  IncredibleEmployment.be  This test is no t yet approved or cleared by the [place] FDA and  has been authorized for detection and/or diagnosis of SARS-CoV-2 by FDA under an Emergency Use  Authorization (EUA). This EUA will remain  in effect (meaning this test can be used) for the duration of the COVID-19 declaration under Section 564(b)(1) of the Act, 21 U.S.C.section 360bbb-3(b)(1), unless the authorization is terminated  or revoked sooner.       Influenza A by PCR NEGATIVE NEGATIVE Final   Influenza B by PCR NEGATIVE NEGATIVE Final    Comment: (NOTE) The Xpert Xpress SARS-CoV-2/FLU/RSV plus assay is intended as an aid in the diagnosis of influenza from Nasopharyngeal swab specimens and should not be used as a sole basis for treatment. Nasal washings and aspirates are unacceptable for Xpert Xpress SARS-CoV-2/FLU/RSV testing.  Fact Sheet for Patients: EntrepreneurPulse.com.au  Fact Sheet for Healthcare Providers: IncredibleEmployment.be  This test is not yet approved or cleared by the [place] FDA and has been authorized for detection and/or diagnosis of SARS-CoV-2 by FDA under an Emergency Use Authorization (EUA). This EUA will remain in effect (meaning this test can be used) for the duration of the COVID-19 declaration under Section 564(b)(1) of the Act, 21 U.S.C. section 360bbb-3(b)(1), unless the authorization is terminated or revoked.  Performed at [facility], [address], [place],  [address]         Radiology Studies: DG Chest 2 View  Result Date: 05/01/2020 CLINICAL DATA:  Former smoker.  Congestive heart failure. EXAM: CHEST - 2 VIEW COMPARISON:  April 30, 2020 FINDINGS: The right-sided pleural effusion and underlying opacity is stable. Stable cardiomegaly. The hila and mediastinum are unchanged. No pneumothorax. No other interval changes. IMPRESSION: Persistent right pleural effusion and underlying opacity, similar in the interval. Electronically Signed   By: Dorise Bullion III M.D   On: 05/01/2020 12:44   DG Chest 2 View  Result Date: 04/30/2020 CLINICAL DATA:  Shortness of  breath. EXAM: CHEST - 2 VIEW COMPARISON:  None. FINDINGS: Cardiac silhouette is enlarged. There are bibasilar densities, right side greater than left. Evidence for pleural effusions, right side greater than left. Negative for pneumothorax. Atherosclerotic calcifications at the aortic arch. IMPRESSION: Cardiomegaly with bibasilar chest densities. Findings are suggestive for bilateral pleural effusions, right side greater than left. There may be underlying consolidation or parenchymal disease in the right lung. Electronically Signed   By: Markus Daft M.D.   On: 04/30/2020 15:48   US RENAL  Result Date: 05/01/2020 CLINICAL DATA:  Active tubular necrosis. EXAM: RENAL / URINARY TRACT ULTRASOUND COMPLETE COMPARISON:  None. FINDINGS: Right Kidney: Renal measurements: 11.1 x 5.2 x 5.1 cm = volume: 155 mL. There is no hydronephrosis. There is increased cortical echogenicity. There is a cyst in the interpolar region of the kidney. Left Kidney: Renal measurements: 11.1 x 6 x 5.4 cm = volume: 187 mL. There is no hydronephrosis. There is increased cortical echogenicity. Bladder: There is debris within the urinary bladder. Both ureteral jets were visualized. Other: Incidentally noted was a right-sided pleural effusion. IMPRESSION: 1. No hydronephrosis. 2. Echogenic kidneys which can be  seen in patients with medical renal disease. 3. Incidentally noted right-sided pleural effusion. 4. Debris within the urinary bladder. This is nonspecific and should be correlated with urinalysis. Electronically Signed   By: Constance Holster M.D.   On: 05/01/2020 21:07   ECHOCARDIOGRAM COMPLETE  Result Date: 05/01/2020    ECHOCARDIOGRAM REPORT   Patient Name:   Katie [name] Date of Exam: 05/01/2020 Medical Rec #:  [id number]    Height:       66.0 in Accession #:    [id number]   Weight:       201.7 lb Date of Birth:  1956/01/13   BSA:          2.007 m Patient Age:    75 years     BP:           158/77 mmHg Patient Gender: F            HR:            83 bpm. Exam Location:  [facility] Procedure: 2D Echo Indications:     CHF  History:         Patient has no prior history of Echocardiogram examinations.                  CHF; Risk Factors:Hypertension.  Sonographer:     L Thornton-Maynard Referring Phys:  [id number] [place] Diagnosing Phys: Neoma Laming MD IMPRESSIONS  1. Left ventricular ejection fraction, by estimation, is 55 to 60%. The left ventricle has normal function. The left ventricle has no regional wall motion abnormalities. There is severe concentric left ventricular hypertrophy. Left ventricular diastolic  parameters are consistent with Grade II diastolic dysfunction (pseudonormalization).  2. Right ventricular systolic function is moderately reduced. The right ventricular size is normal. Mildly increased right ventricular wall thickness. There is moderately elevated pulmonary artery systolic pressure.  3. Left atrial size was mildly dilated.  4. Right atrial size was mildly dilated.  5. Moderate pericardial effusion. The pericardial effusion is circumferential.  6. The mitral valve is abnormal. Mild to moderate mitral valve regurgitation.  7. The tricuspid valve is abnormal. Tricuspid valve regurgitation is mild to moderate.  8. The aortic valve is abnormal. Aortic valve regurgitation is mild to moderate. Mild to moderate aortic valve stenosis. FINDINGS  Left Ventricle: Left ventricular ejection fraction, by estimation, is 55 to 60%. The left ventricle has normal function. The left ventricle has no regional wall motion abnormalities. The left ventricular internal cavity size was small. There is severe concentric left ventricular hypertrophy. Left ventricular diastolic parameters are consistent with Grade II diastolic dysfunction (pseudonormalization). Right Ventricle: The right ventricular size is normal. Mildly increased right ventricular wall thickness. Right ventricular systolic function is moderately reduced. There is moderately  elevated pulmonary artery systolic pressure. The tricuspid regurgitant velocity is 3.47 m/s, and with an assumed right atrial pressure of 3 mmHg, the estimated right ventricular systolic pressure is 58.5 mmHg. Left Atrium: Left atrial size was mildly dilated. Right Atrium: Right atrial size was mildly dilated. Pericardium: A moderately sized pericardial effusion is present. The pericardial effusion is circumferential. Mitral Valve: The mitral valve is abnormal. Mild to moderate mitral valve regurgitation. MV peak gradient, 11.8 mmHg. The mean mitral valve gradient is 6.0 mmHg. Tricuspid Valve: The tricuspid valve is abnormal. Tricuspid valve regurgitation is mild to moderate. Aortic Valve: The aortic valve is abnormal. Aortic valve regurgitation is mild to moderate. Aortic regurgitation PHT measures 334 msec. Mild to moderate aortic stenosis is  present. Aortic valve mean gradient measures 16.0 mmHg. Aortic valve peak gradient  measures 38.7 mmHg. Aortic valve area, by VTI measures 1.87 cm. Pulmonic Valve: The pulmonic valve was normal in structure. Pulmonic valve regurgitation is trivial. Aorta: The aortic root was not well visualized. IAS/Shunts: No atrial level shunt detected by color flow Doppler.  LEFT VENTRICLE PLAX 2D LVIDd:         4.63 cm  Diastology LVIDs:         3.04 cm  LV e' medial:    5.87 cm/s LV PW:         1.08 cm  LV E/e' medial:  20.0 LV IVS:        1.61 cm  LV e' lateral:   6.74 cm/s LVOT diam:     2.20 cm  LV E/e' lateral: 17.4 LV SV:         105 LV SV Index:   52 LVOT Area:     3.80 cm  RIGHT VENTRICLE RV S prime:     12.50 cm/s TAPSE (M-mode): 2.3 cm LEFT ATRIUM             Index       RIGHT ATRIUM           Index LA diam:        3.30 cm 1.64 cm/m  RA Area:     15.50 cm LA Vol (A2C):   66.9 ml 33.33 ml/m RA Volume:   39.50 ml  19.68 ml/m LA Vol (A4C):   73.6 ml 36.67 ml/m LA Biplane Vol: 73.0 ml 36.37 ml/m  AORTIC VALVE                    PULMONIC VALVE AV Area (Vmax):    1.44 cm      PV Vmax:       0.96 m/s AV Area (Vmean):   1.75 cm     PV Peak grad:  3.7 mmHg AV Area (VTI):     1.87 cm AV Vmax:           311.00 cm/s AV Vmean:          187.000 cm/s AV VTI:            0.563 m AV Peak Grad:      38.7 mmHg AV Mean Grad:      16.0 mmHg LVOT Vmax:         118.00 cm/s LVOT Vmean:        85.900 cm/s LVOT VTI:          0.277 m LVOT/AV VTI ratio: 0.49 AI PHT:            334 msec  AORTA Ao Root diam: 3.20 cm MITRAL VALVE                TRICUSPID VALVE MV Area (PHT): 3.86 cm     TR Peak grad:   48.2 mmHg MV Peak grad:  11.8 mmHg    TR Vmax:        347.00 cm/s MV Mean grad:  6.0 mmHg MV Vmax:       1.72 m/s     SHUNTS MV Vmean:      119.0 cm/s   Systemic VTI:  0.28 m MV E velocity: 117.50 cm/s  Systemic Diam: 2.20 cm MV A velocity: 114.50 cm/s MV E/A ratio:  1.03 Neoma Laming MD Electronically signed by Neoma Laming MD Signature Date/Time: 05/01/2020/10:11:01 PM  Final         Scheduled Meds: . amLODipine  5 mg Oral Daily  . enoxaparin (LOVENOX) injection  30 mg Subcutaneous Q24H  . furosemide  60 mg Intravenous BID  . hydrALAZINE  25 mg Oral TID  . insulin aspart  0-9 Units Subcutaneous TID WC  . sodium bicarbonate  650 mg Oral TID   Continuous Infusions:   LOS: 1 day     Cordelia Poche, MD [facility] 05/02/2020, 10:42 AM  If 7PM-7AM, please contact night-coverage [email or web address]

## 2020-05-02 NOTE — Progress Notes (Addendum)
   Addendum: Confirmed cardiology consult for today.  Signed, Arvil Chaco, PA-C 05/02/2020, 11:33 AM

## 2020-05-02 NOTE — Progress Notes (Signed)
[place] visited pt. per OR for support from RN; pt. sitting up in bed awake; she shared that she has been feeling 'depressed' re: multiple stressors.  Pt. moved to [place] this last Wednesday to live w/friend Leigh after being told by her sister in [place], [place] that she could no longer live w/her.  Pt. had moved from her home in [place], [place] to live in [place] to help take care of her terminally-ill brother and terminally-ill father.  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  Pt. shared that she had always been closer to he brother and father than her sister and mother, and that the loss of br. and fr. has been very difficult.  'I've lived alone all my life... Never married and didn't want to.  I never felt lonely until now,' she shared.  Relationship w/syister appears to be complicated by loss and grief; pt. hopes that they will be reconciled but is unsure if this will happen.   Pt. is very concerned that she will lose mobility by being confined to a hospital bed; major goal seems to be to maintain/regain independent mobility.  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  No immediate needs expressed at this time, but pt. grateful for Ch's listening support and invites a follow-up visit.  [place] remains available and will try to follow up later this week.

## 2020-05-02 NOTE — Plan of Care (Signed)
  Problem: Education: Goal: Knowledge of General Education information will improve Description: Including pain rating scale, medication(s)/side effects and non-pharmacologic comfort measures Outcome: Progressing   Problem: Clinical Measurements: Goal: Respiratory complications will improve Outcome: Progressing   Problem: Activity: Goal: Risk for activity intolerance will decrease Outcome: Progressing   

## 2020-05-03 DIAGNOSIS — I5033 Acute on chronic diastolic (congestive) heart failure: Secondary | ICD-10-CM

## 2020-05-03 LAB — GLUCOSE, CAPILLARY
Glucose-Capillary: 132 mg/dL — ABNORMAL HIGH (ref 70–99)
Glucose-Capillary: 170 mg/dL — ABNORMAL HIGH (ref 70–99)
Glucose-Capillary: 107 mg/dL — ABNORMAL HIGH (ref 70–99)
Glucose-Capillary: 140 mg/dL — ABNORMAL HIGH (ref 70–99)

## 2020-05-03 LAB — URINALYSIS, ROUTINE W REFLEX MICROSCOPIC
Bacteria, UA: NONE SEEN
Bilirubin Urine: NEGATIVE
Glucose, UA: 50 mg/dL — AB
Ketones, ur: NEGATIVE mg/dL
Leukocytes,Ua: NEGATIVE
Nitrite: NEGATIVE
Protein, ur: >=300 mg/dL — AB
Specific Gravity, Urine: 1.011 (ref 1.005–1.030)
pH: 5 (ref 5.0–8.0)

## 2020-05-03 LAB — RENAL FUNCTION PANEL
Albumin: 2.5 g/dL — ABNORMAL LOW (ref 3.5–5.0)
Anion gap: 8 (ref 5–15)
BUN: 75 mg/dL — ABNORMAL HIGH (ref 8–23)
CO2: 21 mmol/L — ABNORMAL LOW (ref 22–32)
Calcium: 8 mg/dL — ABNORMAL LOW (ref 8.9–10.3)
Chloride: 110 mmol/L (ref 98–111)
Creatinine, Ser: 2.97 mg/dL — ABNORMAL HIGH (ref 0.44–1.00)
GFR, Estimated: 17 mL/min — ABNORMAL LOW (ref >60)
Glucose, Bld: 138 mg/dL — ABNORMAL HIGH (ref 70–99)
Phosphorus: 5.4 mg/dL — ABNORMAL HIGH (ref 2.5–4.6)
Potassium: 4.4 mmol/L (ref 3.5–5.1)
Sodium: 139 mmol/L (ref 135–145)

## 2020-05-03 LAB — LIPID PANEL
Cholesterol: 137 mg/dL (ref 0–200)
HDL: 51 mg/dL (ref >40)
LDL Cholesterol: 75 mg/dL (ref 0–99)
Total CHOL/HDL Ratio: 2.7 RATIO
Triglycerides: 55 mg/dL (ref <150)
VLDL: 11 mg/dL (ref 0–40)

## 2020-05-03 LAB — PROTEIN / CREATININE RATIO, URINE
Creatinine, Urine: 46 mg/dL
Protein Creatinine Ratio: 5.35 mg/mg{Cre} — ABNORMAL HIGH (ref 0.00–0.15)
Total Protein, Urine: 246 mg/dL

## 2020-05-03 LAB — CREATININE, URINE, RANDOM: Creatinine, Urine: 47 mg/dL

## 2020-05-03 LAB — PROTEIN, URINE, RANDOM: Total Protein, Urine: 246 mg/dL

## 2020-05-03 MED ORDER — OXYCODONE-ACETAMINOPHEN 5-325 MG PO TABS
1.0000 | ORAL_TABLET | Freq: Four times a day (QID) | ORAL | Status: DC | PRN
  Administered 2020-05-03 – 2020-05-17 (×43): 1 via ORAL
  Filled 2020-05-03 (×44): qty 1

## 2020-05-03 MED ORDER — MELATONIN 5 MG PO TABS
5.0000 mg | ORAL_TABLET | Freq: Once | ORAL | Status: AC
  Administered 2020-05-03: 23:00:00 5 mg via ORAL
  Filled 2020-05-03: qty 1

## 2020-05-03 MED ORDER — CARVEDILOL 6.25 MG PO TABS
6.2500 mg | ORAL_TABLET | Freq: Two times a day (BID) | ORAL | Status: DC
  Administered 2020-05-03 – 2020-05-05 (×4): 6.25 mg via ORAL
  Filled 2020-05-03 (×4): qty 1

## 2020-05-03 MED ORDER — SUMATRIPTAN SUCCINATE 50 MG PO TABS
25.0000 mg | ORAL_TABLET | Freq: Once | ORAL | Status: AC
  Administered 2020-05-03: 12:00:00 25 mg via ORAL
  Filled 2020-05-03: qty 1

## 2020-05-03 NOTE — Evaluation (Addendum)
Physical Therapy Evaluation Patient Details Name: Katie [name] MRN: [id number] DOB: 11/09/1955 Today's Date: 05/03/2020   History of Present Illness  Patient is a 64 y.o. female with a history of diabetes mellitus type 2, stage IV CKD, heart failure, hypertension. Patient presented secondary to dyspnea with evidence of volume overload and pleural effusion. Diagnosed with moderate pericardial effusion, mild to moderate aortic insufficiency and aortic stenosis.  Clinical Impression  PT evaluation complete. Patient reports she has had 30 falls within the last 6 months and has recently discharged from a rehabilitation facility prior to moving here. She is ambulatory with a rollator at baseline, limited distances, and needs assistance for ADLs.  Currently patient required Min A for bed mobility, Mod A for transfers. Patient has generalized weakness and unable to stand longer than 2 minutes. Patient took several side steps along edge of bed with Mod A for support. Sp02 93% on room air after standing activity. Recommend PT to maximize independence and address remaining functional limitations. SNF recommended at discharge.     Follow Up Recommendations SNF    Equipment Recommendations  None recommended by PT    Recommendations for Other Services       Precautions / Restrictions Precautions Precautions: Fall Restrictions Weight Bearing Restrictions: No      Mobility  Bed Mobility Overal bed mobility: Needs Assistance Bed Mobility: Supine to Sit;Sit to Supine     Supine to sit: Min guard Sit to supine: Min assist   General bed mobility comments: extra time required to complete tasks. assistance required for LE support for return to bed    Transfers Overall transfer level: Needs assistance Equipment used: Rolling walker (2 wheeled) Transfers: Sit to/from Stand Sit to Stand: Mod assist         General transfer comment: lifting and lowering assistance  provided  Ambulation/Gait Ambulation/Gait assistance: Mod assist Gait Distance (Feet): 2 Feet Assistive device: Rolling walker (2 wheeled)   Gait velocity: decreased   General Gait Details: patient able to take several side steps along edge of bed. assistance for steadying and patient relying on bed for posterior leg support. patient fatigued with Sp02 93% on room air after mobilizing. unable to progress ambulation further due to generalized weakness  Stairs            Wheelchair Mobility    Modified Rankin (Stroke Patients Only)       Balance Overall balance assessment: Needs assistance Sitting-balance support: Feet supported Sitting balance-Leahy Scale: Good     Standing balance support: Bilateral upper extremity supported Standing balance-Leahy Scale: Poor Standing balance comment: patient required Min A for support to maintain standing balance and relying heavily on rolling walker for UE support                             Pertinent Vitals/Pain Pain Assessment: No/denies pain    Home Living Family/patient expects to be discharged to:: Private residence Living Arrangements:  (friend) Available Help at Discharge: Friend(s);Available PRN/intermittently Type of Home: House Home Access: Stairs to enter Entrance Stairs-Rails: Can reach both Entrance Stairs-Number of Steps: 3 Home Layout: One level Home Equipment: Walker - 4 wheels      Prior Function Level of Independence: Needs assistance   Gait / Transfers Assistance Needed: patient reports she ambulates household distance with four wheeled walker  ADL's / Homemaking Assistance Needed: patient reports she needs assistance with ADLs at baseline  Comments: patient also reports she has  had 30 falls in the past 6 months and has been in and out of rehabilitation facilities this year     Hand Dominance        Extremity/Trunk Assessment   Upper Extremity Assessment Upper Extremity Assessment:  Overall WFL for tasks assessed    Lower Extremity Assessment Lower Extremity Assessment: Generalized weakness       Communication      Cognition Arousal/Alertness: Awake/alert Behavior During Therapy: WFL for tasks assessed/performed Overall Cognitive Status: Within Functional Limits for tasks assessed                                        General Comments      Exercises     Assessment/Plan    PT Assessment Patient needs continued PT services  PT Problem List Decreased strength;Decreased activity tolerance;Decreased balance;Decreased mobility;Decreased knowledge of use of DME;Decreased safety awareness       PT Treatment Interventions Gait training;DME instruction;Stair training;Functional mobility training;Therapeutic activities;Therapeutic exercise;Balance training    PT Goals (Current goals can be found in the Care Plan section)  Acute Rehab PT Goals Patient Stated Goal: to go home PT Goal Formulation: With patient Time For Goal Achievement: 05/17/20 Potential to Achieve Goals: Fair    Frequency Min 2X/week   Barriers to discharge        Co-evaluation               AM-PAC PT "6 Clicks" Mobility  Outcome Measure Help needed turning from your back to your side while in a flat bed without using bedrails?: A Little Help needed moving from lying on your back to sitting on the side of a flat bed without using bedrails?: A Little Help needed moving to and from a bed to a chair (including a wheelchair)?: A Little Help needed standing up from a chair using your arms (e.g., wheelchair or bedside chair)?: A Little Help needed to walk in hospital room?: A Lot Help needed climbing 3-5 steps with a railing? : A Lot 6 Click Score: 16    End of Session   Activity Tolerance: Patient tolerated treatment well Patient left: in bed;with call bell/phone within reach;with bed alarm set Nurse Communication: Mobility status PT Visit Diagnosis:  Unsteadiness on feet (R26.81);Muscle weakness (generalized) (M62.81);History of falling (Z91.81)    Time: 1325-1410 PT Time Calculation (min) (ACUTE ONLY): 45 min   Charges:   PT Evaluation $PT Eval Moderate Complexity: 1 Mod PT Treatments $Therapeutic Activity: 23-37 mins        Minna Merritts, PT, MPT   Percell Locus 05/03/2020, 2:34 PM

## 2020-05-03 NOTE — Progress Notes (Signed)
   Heart Failure Nurse Navigator Note  HFpEF 55-60%.  Severe concentric LVH.  Grade 2 diastolic dysfunction.  Right ventricular systolic function is moderately reduced.  Moderately elevated pulmonary artery pressure.  Left atrium is mildly dilated.  Right atrium is mildly dilated.  Moderate pericardial effusion.  Cuspid regurgitation is mild to moderate.  Mild to moderate aortic insufficiency mild to moderate aortic stenosis.  She presented by the way of EMS due to dyspnea and generalized weakness.  [place]  She was found to be hypertensive in the ED with a blood pressure of 223/100 and was also hyperkalemic.   Comorbidities:  Diabetes Hypertension  Chronic kidney disease stage   Medication:  Norvasc 10 mg daily Furosemide 80 mg IV twice daily Hydralazine 25 mg 3 times a day  She is scheduled to start Coreg 6.25 mg twice a day.  Labs:  Sodium 139, potassium 4.4, chloride 110, CO2 21, BUN 75, creatinine 2.97, cholesterol 137, triglycerides 55, HDL 51, LDL 75 Weight is 89.7, 2 days ago she weighed 91.5. BMI is 31.92 Blood pressure 169/76   Assessment:  General-is awake and alert lying in bed, complaining of migraine  HEENT-pupils are equal, no JVD.  She states without her glasses her vision is very blurry.   Cardiac-heart tones of regular rate and rhythm with positive systolic murmur in the right upper sternal border   Chest-breath sounds are diminished in the bases.   Abdomen soft nontender.   Musculoskeletal-lower extremities remain the edematous, pitting.  Psych-she is pleasant and appropriate makes good eye contact   Neurologic-moves all extremities without difficulty, speech is clear.     Spoke with patient today concerning heart failure, states that she does not have glasses and is unable to read in the the materials.  Again offered the heart failure videos.  She states that she would rather review those when her friend  is here.  She is supposed to be coming to this afternoon.  Spoke with the patient's nurse Danae Chen, to make her aware she is willing to look at the videos.  States that she feels about the same as she did yesterday, still has the migraine headache but feels that she is urinating more today.   Continue to follow along.  Pricilla Riffle RN, CHFN

## 2020-05-03 NOTE — NC FL2 (Signed)
[place] LEVEL OF CARE SCREENING TOOL     IDENTIFICATION  Patient Name: Katie [name] Birthdate: November 22, 1955 Sex: female Admission Date (Current Location): 04/30/2020  [place] and [place] Number:  Engineering geologist and Address:  [facility], [address], [place], [place] [address]      Provider Number: [id number]  Attending Physician Name and Address:  Mariel Aloe, MD  Relative Name and Phone Number:  NA    Current Level of Care: Hospital Recommended Level of Care: [place] Prior Approval Number:    Date Approved/Denied:   PASRR Number: [id number]  Discharge Plan: SNF    Current Diagnoses: Patient Active Problem List   Diagnosis Date Noted  . Pressure injury of skin 05/01/2020  . Hyperkalemia   . Volume overload 04/30/2020  . Type 2 diabetes mellitus with stage 5 chronic kidney disease ([place]) 04/30/2020  . Hypertension   . 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[place])   . Type 2 diabetes mellitus (HCC)     Orientation RESPIRATION BLADDER Height & Weight     Self,Time,Situation  Normal Continent Weight: 89.7 kg Height:  5\' 6"  (167.6 cm)  BEHAVIORAL SYMPTOMS/MOOD NEUROLOGICAL BOWEL NUTRITION STATUS      Continent Diet  AMBULATORY STATUS COMMUNICATION OF NEEDS Skin   Limited Assist Verbally Normal                       Personal Care Assistance Level of Assistance  Bathing,Feeding,Dressing Bathing Assistance: Limited assistance Feeding assistance: Independent Dressing Assistance: Limited assistance     Functional Limitations Info  Sight,Hearing,Speech Sight Info: Adequate Hearing Info: Adequate Speech Info: Adequate    SPECIAL CARE FACTORS FREQUENCY  PT (By licensed PT),OT (By licensed OT)     PT Frequency: 5X week OT Frequency: 5X week            Contractures Contractures Info: Not present    Additional Factors Info  Code Status,Allergies Code Status Info:  Full Allergies Info: None           Current Medications (05/03/2020):  This is the current hospital active medication list Current Facility-Administered Medications  Medication Dose Route Frequency Provider Last Rate Last Admin  . acetaminophen (TYLENOL) tablet 650 mg  650 mg Oral Q6H PRN Reubin Milan, MD   650 mg at 05/03/20 1761   Or  . acetaminophen (TYLENOL) suppository 650 mg  650 mg Rectal Q6H PRN Reubin Milan, MD      . amLODipine [facility]) tablet 10 mg  10 mg Oral Daily Marrianne Mood D, PA-C   10 mg at 05/03/20 0805  . carvedilol (COREG) tablet 6.25 mg  6.25 mg Oral BID WC Visser, Jacquelyn D, PA-C      . enoxaparin (LOVENOX) injection 30 mg  30 mg Subcutaneous Q24H Reubin Milan, MD   30 mg at 05/02/20 2108  . furosemide (LASIX) injection 80 mg  80 mg Intravenous BID Kathlyn Sacramento A, MD   80 mg at 05/03/20 0806  . hydrALAZINE (APRESOLINE) tablet 10 mg  10 mg Oral Q6H PRN Mariel Aloe, MD   10 mg at 05/03/20 0314  . hydrALAZINE (APRESOLINE) tablet 25 mg  25 mg Oral TID Reubin Milan, MD   25 mg at 05/03/20 0805  . insulin aspart (novoLOG) injection 0-9 Units  0-9 Units Subcutaneous TID WC Reubin Milan, MD   2 Units at 05/03/20 1204  . ondansetron (  ZOFRAN) tablet 4 mg  4 mg Oral Q6H PRN Reubin Milan, MD       Or  . ondansetron [facility]) injection 4 mg  4 mg Intravenous Q6H PRN Reubin Milan, MD      . oxyCODONE-acetaminophen (PERCOCET/ROXICET) 5-325 MG per tablet 1 tablet  1 tablet Oral Q6H PRN Mariel Aloe, MD   1 tablet at 05/03/20 1217  . sodium bicarbonate tablet 650 mg  650 mg Oral TID Liana Gerold, MD   650 mg at 05/03/20 6147     Discharge Medications: Please see discharge summary for a list of discharge medications.  Relevant Imaging Results:  Relevant Lab Results:   Additional Information    Kerin Salen, RN

## 2020-05-03 NOTE — Progress Notes (Signed)
[place], [name] 05/03/20  Subjective:   Hospital day # 2  Doing fair today.  Diagnosed with moderate pericardial effusion, mild to moderate aortic insufficiency and aortic stenosis. Reports multiple stressors in family life Moved to [place] last week Her main concern is generalized weakness, especially in legs S/p thoracentesis rt lung - 1600 cc fluid removed  Renal: 12/20 0701 - 12/21 0700 In: 360 [P.O.:360] Out: 1250 [Urine:1250] Lab Results  Component Value Date   CREATININE 2.97 (H) 05/03/2020   CREATININE 2.87 (H) 05/02/2020   CREATININE 2.93 (H) 05/01/2020     Objective:  Vital signs in last 24 hours:  Temp:  [98.2 F (36.8 C)-99.6 F (37.6 C)] 99.6 F (37.6 C) (12/21 1147) Pulse Rate:  [73-80] 78 (12/21 1147) Resp:  [16] 16 (12/21 0418) BP: (137-199)/(61-85) 137/61 (12/21 1147) SpO2:  [94 %-99 %] 95 % (12/21 1147) Weight:  [89.7 kg] 89.7 kg (12/21 0304)  Weight change:  Filed Weights   04/30/20 2045 05/01/20 0327 05/03/20 0304  Weight: 91.7 kg 91.5 kg 89.7 kg    Intake/Output:    Intake/Output Summary (Last 24 hours) at 05/03/2020 1556 Last data filed at 05/03/2020 1345 Gross per 24 hour  Intake 600 ml  Output 750 ml  Net -150 ml     Physical Exam: General:  Chronically ill-appearing, laying in the bed  HEENT  anicteric, moist oral mucous membranes  Pulm/lungs  normal breathing effort  CVS/Heart  regular, no rub  Abdomen:   Soft, nontender  Extremities: + Dependent edema  Neurologic:  Alert, able to answer questions  Skin:  No acute rashes    Basic Metabolic Panel:  Recent Labs  Lab 04/30/20 1158 05/01/20 0519 05/02/20 0507 05/03/20 0408  NA 143 141 142 139  K 5.6* 5.0 4.6 4.4  CL 115* 114* 113* 110  CO2 20* 20* 20* 21*  GLUCOSE 116* 85 88 138*  BUN 83* 84* 79* 75*  CREATININE 2.97* 2.93* 2.87* 2.97*  CALCIUM 8.5* 8.1* 8.3* 8.0*  PHOS  --   --  5.6* 5.4*     CBC: Recent Labs  Lab  04/30/20 1158 05/01/20 0519  WBC 9.2 6.8  HGB 10.5* 8.6*  HCT 34.4* 27.8*  MCV 101.8* 101.1*  PLT 197 150     No results found for: HEPBSAG, HEPBSAB, HEPBIGM    Microbiology:  Recent Results (from the past 240 hour(s))  Resp Panel by RT-PCR (Flu A&B, Covid) Nasopharyngeal Swab     Status: None   Collection Time: 04/30/20  4:01 PM   Specimen: Nasopharyngeal Swab; Nasopharyngeal(NP) swabs in vial transport medium  Result Value Ref Range Status   SARS Coronavirus 2 by RT PCR NEGATIVE NEGATIVE Final    Comment: (NOTE) SARS-CoV-2 target nucleic acids are NOT DETECTED.  The SARS-CoV-2 RNA is generally detectable in upper respiratory specimens during the acute phase of infection. The lowest concentration of SARS-CoV-2 viral copies this assay can detect is 138 copies/mL. A negative result does not preclude SARS-Cov-2 infection and should not be used as the sole basis for treatment or other patient management decisions. A negative result may occur with  improper specimen collection/handling, submission of specimen other than nasopharyngeal swab, presence of viral mutation(s) within the areas targeted by this assay, and inadequate number of viral copies(<138 copies/mL). A negative result must be combined with clinical observations, patient history, and epidemiological information. The expected result is Negative.  Fact Sheet for Patients:  EntrepreneurPulse.com.au  Fact Sheet for Healthcare  Providers:  IncredibleEmployment.be  This test is no t yet approved or cleared by the [place] and  has been authorized for detection and/or diagnosis of SARS-CoV-2 by FDA under an Emergency Use Authorization (EUA). This EUA will remain  in effect (meaning this test can be used) for the duration of the COVID-19 declaration under Section 564(b)(1) of the Act, 21 U.S.C.section 360bbb-3(b)(1), unless the authorization is terminated  or revoked  sooner.       Influenza A by PCR NEGATIVE NEGATIVE Final   Influenza B by PCR NEGATIVE NEGATIVE Final    Comment: (NOTE) The Xpert Xpress SARS-CoV-2/FLU/RSV plus assay is intended as an aid in the diagnosis of influenza from Nasopharyngeal swab specimens and should not be used as a sole basis for treatment. Nasal washings and aspirates are unacceptable for Xpert Xpress SARS-CoV-2/FLU/RSV testing.  Fact Sheet for Patients: EntrepreneurPulse.com.au  Fact Sheet for Healthcare Providers: IncredibleEmployment.be  This test is not yet approved or cleared by the [place] FDA and has been authorized for detection and/or diagnosis of SARS-CoV-2 by FDA under an Emergency Use Authorization (EUA). This EUA will remain in effect (meaning this test can be used) for the duration of the COVID-19 declaration under Section 564(b)(1) of the Act, 21 U.S.C. section 360bbb-3(b)(1), unless the authorization is terminated or revoked.  Performed at [facility], [address] [place], [place] [address]   Body fluid culture     Status: None (Preliminary result)   Collection Time: 05/02/20  4:44 PM   Specimen: PATH Cytology Pleural fluid  Result Value Ref Range Status   Specimen Description   Final    PLEURAL Performed at [facility], [address] [place], [place] [address]    Special Requests   Final    NONE Performed at [facility], [address] [place], [place] [address]    Gram Stain   Final    MODERATE WBC PRESENT, PREDOMINANTLY MONONUCLEAR NO ORGANISMS SEEN    Culture   Final    NO GROWTH < 12 HOURS Performed at [facility], [place] [address] [place],  [address]    Report Status PENDING  Incomplete    Coagulation Studies: No results for input(s): LABPROT, INR in the last 72 hours.  Urinalysis: Recent Labs    05/03/20 1158  COLORURINE YELLOW*  LABSPEC 1.011  PHURINE 5.0  GLUCOSEU 50*   HGBUR SMALL*  BILIRUBINUR NEGATIVE  KETONESUR NEGATIVE  PROTEINUR >=300*  NITRITE NEGATIVE  LEUKOCYTESUR NEGATIVE      Imaging: DG Chest 1 View  Result Date: 05/02/2020 CLINICAL DATA:  Follow-up pleural effusions status post thoracentesis. EXAM: CHEST  1 VIEW COMPARISON:  05/01/2020 FINDINGS: Stable cardiomediastinal contours. Decreased volume of right pleural effusion status post thoracentesis. No pneumothorax identified. Stable appearance of left pleural effusion. IMPRESSION: Decreased volume of right pleural effusion status post thoracentesis. No pneumothorax. Electronically Signed   By: Kerby Moors M.D.   On: 05/02/2020 17:15   US RENAL  Result Date: 05/01/2020 CLINICAL DATA:  Active tubular necrosis. EXAM: RENAL / URINARY TRACT ULTRASOUND COMPLETE COMPARISON:  None. FINDINGS: Right Kidney: Renal measurements: 11.1 x 5.2 x 5.1 cm = volume: 155 mL. There is no hydronephrosis. There is increased cortical echogenicity. There is a cyst in the interpolar region of the kidney. Left Kidney: Renal measurements: 11.1 x 6 x 5.4 cm = volume: 187 mL. There is no hydronephrosis. There is increased cortical echogenicity. Bladder: There is debris within the urinary bladder.  Both ureteral jets were visualized. Other: Incidentally noted was a right-sided pleural effusion. IMPRESSION: 1. No hydronephrosis. 2. Echogenic kidneys which can be seen in patients with medical renal disease. 3. Incidentally noted right-sided pleural effusion. 4. Debris within the urinary bladder. This is nonspecific and should be correlated with urinalysis. Electronically Signed   By: Constance Holster M.D.   On: 05/01/2020 21:07   US THORACENTESIS ASP PLEURAL SPACE W/IMG GUIDE  Result Date: 05/02/2020 INDICATION: 64 year old with a right pleural effusion. Request for thoracentesis. EXAM: ULTRASOUND GUIDED RIGHT THORACENTESIS MEDICATIONS: None. COMPLICATIONS: None immediate. PROCEDURE: An ultrasound guided thoracentesis was  thoroughly discussed with the patient and questions answered. The benefits, risks, alternatives and complications were also discussed. The patient understands and wishes to proceed with the procedure. Written consent was obtained. Ultrasound was performed to localize and mark an adequate pocket of fluid in the right chest. The area was then prepped and draped in the normal sterile fashion. 1% Lidocaine was used for local anesthesia. Under ultrasound guidance a 6 Fr Safe-T-Centesis catheter was introduced. Thoracentesis was performed. The catheter was removed and a dressing applied. FINDINGS: A total of approximately 1.6 L of yellow fluid was removed. Samples were sent to the laboratory as requested by the clinical team. IMPRESSION: Successful ultrasound guided right thoracentesis yielding 1.6 L of pleural fluid. Electronically Signed   By: Markus Daft M.D.   On: 05/02/2020 17:13     Medications:    . amLODipine  10 mg Oral Daily  . carvedilol  6.25 mg Oral BID WC  . enoxaparin (LOVENOX) injection  30 mg Subcutaneous Q24H  . furosemide  80 mg Intravenous BID  . hydrALAZINE  25 mg Oral TID  . insulin aspart  0-9 Units Subcutaneous TID WC  . sodium bicarbonate  650 mg Oral TID   acetaminophen **OR** acetaminophen, hydrALAZINE, ondansetron **OR** ondansetron (ZOFRAN) IV, oxyCODONE-acetaminophen  Assessment/ Plan:  64 y.o. female with heart failure with preserved EF, hypertension, CKD, stroke   admitted on 04/30/2020 for Chronic kidney disease (CKD), stage IV (severe) (HCC) [N18.4] Volume overload [E87.70]  #Acute kidney injury on probable chronic kidney disease stage IV   Current creatinine of 2.87, GFR 18 Awaiting records from previous nephrology evaluation in [place].   Renal ultrasound shows echogenic kidneys  #Severe hypertension, lower extremity edema 2D echo with LVEF 55 to 60%, moderately elevated pulmonary artery systolic pressures, moderate pericardial effusion, Mild to moderate  aortic stenosis  currently managed with IV furosemide 80 mg twice daily Can consider switching to oral torsemide tomorrow   LOS: 2 Katie [name] 12/21/20213:56 PM  [place], [place]  Note: This note was prepared with Dragon dictation. Any transcription errors are unintentional

## 2020-05-03 NOTE — Progress Notes (Addendum)
PROGRESS NOTE    Katie [name]  PPJ:[id number] DOB: 1956-02-03 DOA: 04/30/2020 PCP: Patient, [name]   Brief Narrative: Katie [name] is a 64 y.o. female with a history of diabetes mellitus type 2, stage IV CKD, heart failure, hypertension. Patient presented secondary to dyspnea with evidence of volume overload and pleural effusion.   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 Cardiomegaly on chest x-ray. Started on Lasix IV on admission. -Lasix as below  Pleural effusion Unsure of etiology but in setting of fluid overload with new diagnosis of grade 2 diastolic heart failure. No previous history of pleural effusion. Diagnostic right thoracentesis performed on 12/20. Initial studies suggest likely transudative process but pleural fluid protein is still pending. Culture, cytology pending.  Diabetes mellitus, type 2 Hemoglobin A1C of 4.7%. No need for pharmacologic treatment. Diet control.  CKD stage V Unknown baseline. Patient is unsure of her primary nephrologist in [place]. Creatinine of 2.97 on admission. Per patient, she was planned to start hemodialysis in addition to consideration for renal transplant. Nephrology consulted on 12/20 -Nephrology recommendations  Acute diastolic heart failure New diagnosis. Grade 2 dysfunction. Normal EF. Associated RV systolic dysfunction with elevated PA pressure. Weight down 2 kg from admission. Net IO Since Admission: -2,855 mL [05/03/20 1610] -Cardiology Recommendations: Lasix 80 mg IV -Daily weights/strict in and out  Moderate pericardial effusion No clinical evidence of tamponade. No recent infections. In setting of CKD V with elevated BUN. No chest pain. -Cardiology recommendation:  outpatient Transthoracic Echocardiogram   Mild/moderate aortic stenosis Unsure if this is symptomatic. Patient does have dyspnea with exertion but this may be multifactorial. -Cardiology recommendations: outpatient Transthoracic Echocardiogram   Primary hypertension Patient is on at least hydrochlorothiazide as an outpatient but is unsure of her regimen. Currently uncontrolled. -Amlodipine 10 mg daily; continue Lasix diuresis -Cardiology added Coreg -Continue hydralazine 10 mg q6 hours prn  Anemia Possible related to underlying kidney disease. Evidence of macrocytosis on CBC. TIBC low but otherwise normal iron panel. Vitamin B12 and folate normal.  Hyperkalemia In setting of CKD. Lokelma 10 g given with improvement.  Pressure injury Mid coccyx, POA  History of tobacco use Patient has quit for at least 10 years. Previously, 20 pack year history.  Headache Normal chronic headache. Patient states she takes Imitrex -Imitrex  Obesity Body mass index is 31.92 kg/m.   DVT prophylaxis: Lovenox Code Status:   Code Status: Full Code Family Communication: None at bedside Disposition Plan: Discharge SNF likely in 2-4 days pending diuresis.   Consultants:   Nephrology  Cardiology  Procedures:   TRANSTHORACIC ECHOCARDIOGRAM (05/01/2020) IMPRESSIONS    1. Left ventricular ejection fraction, by estimation, is 55 to 60%. The  left ventricle has normal function. The left ventricle has no regional  wall motion abnormalities. There is severe concentric left ventricular  hypertrophy. Left ventricular diastolic  parameters are consistent with Grade II diastolic dysfunction  (pseudonormalization).  2. Right ventricular systolic function is moderately reduced. The right  ventricular size is normal. Mildly increased right ventricular wall  thickness. There is moderately elevated pulmonary artery systolic  pressure.  3. Left atrial size was mildly dilated.  4. Right atrial  size was mildly dilated.  5. Moderate pericardial effusion. The pericardial effusion is  circumferential.  6. The mitral valve is abnormal. Mild to moderate mitral valve  regurgitation.  7. The tricuspid valve is abnormal. Tricuspid valve regurgitation is mild  to moderate.  8. The aortic valve is abnormal. Aortic valve regurgitation is mild to  moderate. Mild to moderate aortic valve stenosis.  Antimicrobials:  None    Subjective: Symptoms improved. Tolerated thoracentesis well. Headache.  Objective: Vitals:   05/03/20 0304 05/03/20 0418 05/03/20 0736 05/03/20 1147  BP: (!) 199/85 (!) 165/77 (!) 169/76 137/61  Pulse: 80 77 75 78  Resp:  16    Temp: 98.2 F (36.8 C) 98.4 F (36.9 C) 98.9 F (37.2 C) 99.6 F (37.6 C)  TempSrc: Oral Oral Oral Oral  SpO2: 97% 96% 95% 95%  Weight: 89.7 kg     Height:        Intake/Output Summary (Last 24 hours) at 05/03/2020 1558 Last data filed at 05/03/2020 1345 Gross per 24 hour  Intake 600 ml  Output 750 ml  Net -150 ml   Filed Weights   04/30/20 2045 05/01/20 0327 05/03/20 0304  Weight: 91.7 kg 91.5 kg 89.7 kg    Examination:  General exam: Appears calm and comfortable Respiratory system: Clear to auscultation. Respiratory effort normal. Cardiovascular system: S1 & S2 heard, RRR. No murmurs, rubs, gallops or clicks. Gastrointestinal system: Abdomen is nondistended, soft and nontender. No organomegaly or masses felt. Normal bowel sounds heard. Central nervous system: Alert and oriented. No focal neurological deficits. Musculoskeletal: 2+ BLE edema. No calf tenderness Skin: No cyanosis. No rashes Psychiatry: Judgement and insight appear normal. Mood & affect appropriate.    Data Reviewed: I have personally reviewed following labs and imaging studies  CBC Lab Results  Component Value Date   WBC 6.8 05/01/2020   RBC 3.03 (L) 05/01/2020   HGB 8.6 (L) 05/01/2020   HCT 27.8 (L) 05/01/2020   MCV 101.1 (H) 05/01/2020    MCH 31.3 05/01/2020   PLT 150 05/01/2020   MCHC 30.9 05/01/2020   RDW 14.4 93/26/7124     Last metabolic panel Lab Results  Component Value Date   NA 139 05/03/2020   K 4.4 05/03/2020   CL 110 05/03/2020   CO2 21 (L) 05/03/2020   BUN 75 (H) 05/03/2020   CREATININE 2.97 (H) 05/03/2020   GLUCOSE 138 (H) 05/03/2020   GFRNONAA 17 (L) 05/03/2020   CALCIUM 8.0 (L) 05/03/2020   PHOS 5.4 (H) 05/03/2020   PROT 5.6 (L) 05/02/2020   ALBUMIN 2.5 (L) 05/03/2020   BILITOT 0.9 04/30/2020   ALKPHOS 68 04/30/2020   AST 20 04/30/2020   ALT 26 04/30/2020   ANIONGAP 8 05/03/2020    CBG (last 3)  Recent Labs    05/02/20 2035 05/03/20 0737 05/03/20 1148  GLUCAP 167* 132* 170*     GFR: Estimated Creatinine Clearance: 22.5 mL/min (A) (by C-G formula based on SCr of 2.97 mg/dL (H)).  Coagulation Profile: No results for input(s): INR, PROTIME in the last 168 hours.  Recent Results (from the past 240 hour(s))  Resp Panel by RT-PCR (Flu A&B, Covid) Nasopharyngeal Swab     Status: None   Collection Time: 04/30/20  4:01 PM   Specimen: Nasopharyngeal Swab; Nasopharyngeal(NP) swabs in vial transport medium  Result Value Ref Range Status   SARS Coronavirus 2 by RT PCR NEGATIVE NEGATIVE Final    Comment: (NOTE) SARS-CoV-2 target nucleic acids are NOT DETECTED.  The SARS-CoV-2 RNA is generally  detectable in upper respiratory specimens during the acute phase of infection. The lowest concentration of SARS-CoV-2 viral copies this assay can detect is 138 copies/mL. A negative result does not preclude SARS-Cov-2 infection and should not be used as the sole basis for treatment or other patient management decisions. A negative result may occur with  improper specimen collection/handling, submission of specimen other than nasopharyngeal swab, presence of viral mutation(s) within the areas targeted by this assay, and inadequate number of viral copies(<138 copies/mL). A negative result must be  combined with clinical observations, patient history, and epidemiological information. The expected result is Negative.  Fact Sheet for Patients:  EntrepreneurPulse.com.au  Fact Sheet for Healthcare Providers:  IncredibleEmployment.be  This test is no t yet approved or cleared by the [place] FDA and  has been authorized for detection and/or diagnosis of SARS-CoV-2 by FDA under an Emergency Use Authorization (EUA). This EUA will remain  in effect (meaning this test can be used) for the duration of the COVID-19 declaration under Section 564(b)(1) of the Act, 21 U.S.C.section 360bbb-3(b)(1), unless the authorization is terminated  or revoked sooner.       Influenza A by PCR NEGATIVE NEGATIVE Final   Influenza B by PCR NEGATIVE NEGATIVE Final    Comment: (NOTE) The Xpert Xpress SARS-CoV-2/FLU/RSV plus assay is intended as an aid in the diagnosis of influenza from Nasopharyngeal swab specimens and should not be used as a sole basis for treatment. Nasal washings and aspirates are unacceptable for Xpert Xpress SARS-CoV-2/FLU/RSV testing.  Fact Sheet for Patients: EntrepreneurPulse.com.au  Fact Sheet for Healthcare Providers: IncredibleEmployment.be  This test is not yet approved or cleared by the [place] FDA and has been authorized for detection and/or diagnosis of SARS-CoV-2 by FDA under an Emergency Use Authorization (EUA). This EUA will remain in effect (meaning this test can be used) for the duration of the COVID-19 declaration under Section 564(b)(1) of the Act, 21 U.S.C. section 360bbb-3(b)(1), unless the authorization is terminated or revoked.  Performed at [facility], [address] [place], [place] [address]   Body fluid culture     Status: None (Preliminary result)   Collection Time: 05/02/20  4:44 PM   Specimen: PATH Cytology Pleural fluid  Result Value Ref Range Status    Specimen Description   Final    PLEURAL Performed at [facility] [address], [place], [place] [address]    Special Requests   Final    NONE Performed at [facility], [address] [place], [place] [address]    Gram Stain   Final    MODERATE WBC PRESENT, PREDOMINANTLY MONONUCLEAR NO ORGANISMS SEEN    Culture   Final    NO GROWTH < 12 HOURS Performed at [facility], [place] [address], [place], [place] [address]    Report Status PENDING  Incomplete        Radiology Studies: DG Chest 1 View  Result Date: 05/02/2020 CLINICAL DATA:  Follow-up pleural effusions status post thoracentesis. EXAM: CHEST  1 VIEW COMPARISON:  05/01/2020 FINDINGS: Stable cardiomediastinal contours. Decreased volume of right pleural effusion status post thoracentesis. No pneumothorax identified. Stable appearance of left pleural effusion. IMPRESSION: Decreased volume of right pleural effusion status post thoracentesis. No pneumothorax. Electronically Signed   By: Kerby Moors M.D.   On: 05/02/2020 17:15   US RENAL  Result Date: 05/01/2020 CLINICAL DATA:  Active tubular necrosis. EXAM: RENAL / URINARY TRACT ULTRASOUND COMPLETE COMPARISON:  None. FINDINGS: Right Kidney:  Renal measurements: 11.1 x 5.2 x 5.1 cm = volume: 155 mL. There is no hydronephrosis. There is increased cortical echogenicity. There is a cyst in the interpolar region of the kidney. Left Kidney: Renal measurements: 11.1 x 6 x 5.4 cm = volume: 187 mL. There is no hydronephrosis. There is increased cortical echogenicity. Bladder: There is debris within the urinary bladder. Both ureteral jets were visualized. Other: Incidentally noted was a right-sided pleural effusion. IMPRESSION: 1. No hydronephrosis. 2. Echogenic kidneys which can be seen in patients with medical renal disease. 3. Incidentally noted right-sided pleural effusion. 4. Debris within the urinary bladder. This is nonspecific and should be correlated  with urinalysis. Electronically Signed   By: Constance Holster M.D.   On: 05/01/2020 21:07   US THORACENTESIS ASP PLEURAL SPACE W/IMG GUIDE  Result Date: 05/02/2020 INDICATION: 64 year old with a right pleural effusion. Request for thoracentesis. EXAM: ULTRASOUND GUIDED RIGHT THORACENTESIS MEDICATIONS: None. COMPLICATIONS: None immediate. PROCEDURE: An ultrasound guided thoracentesis was thoroughly discussed with the patient and questions answered. The benefits, risks, alternatives and complications were also discussed. The patient understands and wishes to proceed with the procedure. Written consent was obtained. Ultrasound was performed to localize and mark an adequate pocket of fluid in the right chest. The area was then prepped and draped in the normal sterile fashion. 1% Lidocaine was used for local anesthesia. Under ultrasound guidance a 6 Fr Safe-T-Centesis catheter was introduced. Thoracentesis was performed. The catheter was removed and a dressing applied. FINDINGS: A total of approximately 1.6 L of yellow fluid was removed. Samples were sent to the laboratory as requested by the clinical team. IMPRESSION: Successful ultrasound guided right thoracentesis yielding 1.6 L of pleural fluid. Electronically Signed   By: Markus Daft M.D.   On: 05/02/2020 17:13        Scheduled Meds:  amLODipine  10 mg Oral Daily   carvedilol  6.25 mg Oral BID WC   enoxaparin (LOVENOX) injection  30 mg Subcutaneous Q24H   furosemide  80 mg Intravenous BID   hydrALAZINE  25 mg Oral TID   insulin aspart  0-9 Units Subcutaneous TID WC   sodium bicarbonate  650 mg Oral TID   Continuous Infusions:   LOS: 2 days     Cordelia Poche, MD [facility] 05/03/2020, 3:58 PM  If 7PM-7AM, please contact night-coverage [email or web address]

## 2020-05-03 NOTE — Progress Notes (Addendum)
Progress Note  Patient Name: Katie [name] Date of Encounter: 05/03/2020  Primary Cardiologist: New, Dr. Fletcher Anon  Subjective   States she did not sleep well overnight.  Reports some chest discomfort that started after her procedure/thoracentesis yesterday.  She reports the chest discomfort as closer to her lower chest/epigastric pain.  She also feels some side and back pain s/p R lung thoracentesis.    She is unsure if this procedure helped her breathing but overall feels as if she is breathing better when compared with yesterday.  She is off of nasal cannula oxygen and reports that her shortness of breath though improved from that of before admission.  She reports some racing heart rate.  No palpitations.  She reports headache when her blood pressure is elevated.  She remains fatigued.  Inpatient Medications    Scheduled Meds: . amLODipine  10 mg Oral Daily  . carvedilol  6.25 mg Oral BID WC  . enoxaparin (LOVENOX) injection  30 mg Subcutaneous Q24H  . furosemide  80 mg Intravenous BID  . hydrALAZINE  25 mg Oral TID  . insulin aspart  0-9 Units Subcutaneous TID WC  . sodium bicarbonate  650 mg Oral TID   Continuous Infusions:  PRN Meds: acetaminophen **OR** acetaminophen, hydrALAZINE, ondansetron **OR** ondansetron (ZOFRAN) IV   Vital Signs    Vitals:   05/02/20 1952 05/03/20 0304 05/03/20 0418 05/03/20 0736  BP: (!) 164/83 (!) 199/85 (!) 165/77 (!) 169/76  Pulse: 73 80 77 75  Resp: 16  16   Temp: 98.5 F (36.9 C) 98.2 F (36.8 C) 98.4 F (36.9 C) 98.9 F (37.2 C)  TempSrc: Oral Oral Oral Oral  SpO2: 94% 97% 96% 95%  Weight:  89.7 kg    Height:        Intake/Output Summary (Last 24 hours) at 05/03/2020 0847 Last data filed at 05/02/2020 1953 Gross per 24 hour  Intake 360 ml  Output 1250 ml  Net -890 ml   Last 3 Weights 05/03/2020 05/01/2020 04/30/2020  Weight (lbs) 197 lb 12 oz 201 lb 11.2 oz 202 lb 1.6 oz  Weight (kg) 89.7 kg 91.491 kg 91.672 kg       Telemetry    NSR, 70-90s - Personally Reviewed  ECG    No new tracings- Personally Reviewed  Physical Exam   GEN: No acute distress.   Neck: No JVD Cardiac: RRR, 2/6 crescendo/decrescendo systolic murmur best appreciated RUSB, early peaking.  Np rubs, or gallops.  Respiratory:  Lung sounds still diminished at right base, left base with crackles GI: Soft, nontender, non-distended  MS:  Moderate to 1+ bilateral lower extremity edema.; No deformity. Neuro:  Nonfocal  Psych: Normal affect   Labs    High Sensitivity Troponin:   Recent Labs  Lab 04/30/20 1601 04/30/20 2114  TROPONINIHS 24* 21*      Chemistry Recent Labs  Lab 04/30/20 1601 05/01/20 0519 05/02/20 0507 05/02/20 1100 05/03/20 0408  NA  --  141 142  --  139  K  --  5.0 4.6  --  4.4  CL  --  114* 113*  --  110  CO2  --  20* 20*  --  21*  GLUCOSE  --  85 88  --  138*  BUN  --  84* 79*  --  75*  CREATININE  --  2.93* 2.87*  --  2.97*  CALCIUM  --  8.1* 8.3*  --  8.0*  PROT 6.6  --   --  5.6*  --   ALBUMIN 3.2*  --  2.6*  --  2.5*  AST 20  --   --   --   --   ALT 26  --   --   --   --   ALKPHOS 68  --   --   --   --   BILITOT 0.9  --   --   --   --   GFRNONAA  --  18* 18*  --  17*  ANIONGAP  --  7 9  --  8     Hematology Recent Labs  Lab 04/30/20 1158 05/01/20 0519 05/01/20 1802  WBC 9.2 6.8  --   RBC 3.38* 2.75* 3.03*  HGB 10.5* 8.6*  --   HCT 34.4* 27.8*  --   MCV 101.8* 101.1*  --   MCH 31.1 31.3  --   MCHC 30.5 30.9  --   RDW 14.6 14.4  --   PLT 197 150  --     BNP Recent Labs  Lab 04/30/20 1601  BNP 769.6*     DDimer No results for input(s): DDIMER in the last 168 hours.   Radiology    DG Chest 1 View  Result Date: 05/02/2020 CLINICAL DATA:  Follow-up pleural effusions status post thoracentesis. EXAM: CHEST  1 VIEW COMPARISON:  05/01/2020 FINDINGS: Stable cardiomediastinal contours. Decreased volume of right pleural effusion status post thoracentesis. No pneumothorax  identified. Stable appearance of left pleural effusion. IMPRESSION: Decreased volume of right pleural effusion status post thoracentesis. No pneumothorax. Electronically Signed   By: Kerby Moors M.D.   On: 05/02/2020 17:15   DG Chest 2 View  Result Date: 05/01/2020 CLINICAL DATA:  Former smoker.  Congestive heart failure. EXAM: CHEST - 2 VIEW COMPARISON:  April 30, 2020 FINDINGS: The right-sided pleural effusion and underlying opacity is stable. Stable cardiomegaly. The hila and mediastinum are unchanged. No pneumothorax. No other interval changes. IMPRESSION: Persistent right pleural effusion and underlying opacity, similar in the interval. Electronically Signed   By: Dorise Bullion III M.D   On: 05/01/2020 12:44   US RENAL  Result Date: 05/01/2020 CLINICAL DATA:  Active tubular necrosis. EXAM: RENAL / URINARY TRACT ULTRASOUND COMPLETE COMPARISON:  None. FINDINGS: Right Kidney: Renal measurements: 11.1 x 5.2 x 5.1 cm = volume: 155 mL. There is no hydronephrosis. There is increased cortical echogenicity. There is a cyst in the interpolar region of the kidney. Left Kidney: Renal measurements: 11.1 x 6 x 5.4 cm = volume: 187 mL. There is no hydronephrosis. There is increased cortical echogenicity. Bladder: There is debris within the urinary bladder. Both ureteral jets were visualized. Other: Incidentally noted was a right-sided pleural effusion. IMPRESSION: 1. No hydronephrosis. 2. Echogenic kidneys which can be seen in patients with medical renal disease. 3. Incidentally noted right-sided pleural effusion. 4. Debris within the urinary bladder. This is nonspecific and should be correlated with urinalysis. Electronically Signed   By: Constance Holster M.D.   On: 05/01/2020 21:07   ECHOCARDIOGRAM COMPLETE  Result Date: 05/01/2020    ECHOCARDIOGRAM REPORT   Patient Name:   Katie [name] Date of Exam: 05/01/2020 Medical Rec #:  [id number]    Height:       66.0 in Accession #:    [id number]   Weight:        201.7 lb Date of Birth:  03/10/1956   BSA:          2.007 m Patient Age:  64 years     BP:           158/77 mmHg Patient Gender: F            HR:           83 bpm. Exam Location:  [facility] Procedure: 2D Echo Indications:     CHF  History:         Patient has no prior history of Echocardiogram examinations.                  CHF; Risk Factors:Hypertension.  Sonographer:     L Thornton-Maynard Referring Phys:  [id number] [place] Diagnosing Phys: Neoma Laming MD IMPRESSIONS  1. Left ventricular ejection fraction, by estimation, is 55 to 60%. The left ventricle has normal function. The left ventricle has no regional wall motion abnormalities. There is severe concentric left ventricular hypertrophy. Left ventricular diastolic  parameters are consistent with Grade II diastolic dysfunction (pseudonormalization).  2. Right ventricular systolic function is moderately reduced. The right ventricular size is normal. Mildly increased right ventricular wall thickness. There is moderately elevated pulmonary artery systolic pressure.  3. Left atrial size was mildly dilated.  4. Right atrial size was mildly dilated.  5. Moderate pericardial effusion. The pericardial effusion is circumferential.  6. The mitral valve is abnormal. Mild to moderate mitral valve regurgitation.  7. The tricuspid valve is abnormal. Tricuspid valve regurgitation is mild to moderate.  8. The aortic valve is abnormal. Aortic valve regurgitation is mild to moderate. Mild to moderate aortic valve stenosis. FINDINGS  Left Ventricle: Left ventricular ejection fraction, by estimation, is 55 to 60%. The left ventricle has normal function. The left ventricle has no regional wall motion abnormalities. The left ventricular internal cavity size was small. There is severe concentric left ventricular hypertrophy. Left ventricular diastolic parameters are consistent with Grade II diastolic dysfunction (pseudonormalization). Right Ventricle: The right ventricular  size is normal. Mildly increased right ventricular wall thickness. Right ventricular systolic function is moderately reduced. There is moderately elevated pulmonary artery systolic pressure. The tricuspid regurgitant velocity is 3.47 m/s, and with an assumed right atrial pressure of 3 mmHg, the estimated right ventricular systolic pressure is 12.1 mmHg. Left Atrium: Left atrial size was mildly dilated. Right Atrium: Right atrial size was mildly dilated. Pericardium: A moderately sized pericardial effusion is present. The pericardial effusion is circumferential. Mitral Valve: The mitral valve is abnormal. Mild to moderate mitral valve regurgitation. MV peak gradient, 11.8 mmHg. The mean mitral valve gradient is 6.0 mmHg. Tricuspid Valve: The tricuspid valve is abnormal. Tricuspid valve regurgitation is mild to moderate. Aortic Valve: The aortic valve is abnormal. Aortic valve regurgitation is mild to moderate. Aortic regurgitation PHT measures 334 msec. Mild to moderate aortic stenosis is present. Aortic valve mean gradient measures 16.0 mmHg. Aortic valve peak gradient  measures 38.7 mmHg. Aortic valve area, by VTI measures 1.87 cm. Pulmonic Valve: The pulmonic valve was normal in structure. Pulmonic valve regurgitation is trivial. Aorta: The aortic root was not well visualized. IAS/Shunts: No atrial level shunt detected by color flow Doppler.  LEFT VENTRICLE PLAX 2D LVIDd:         4.63 cm  Diastology LVIDs:         3.04 cm  LV e' medial:    5.87 cm/s LV PW:         1.08 cm  LV E/e' medial:  20.0 LV IVS:        1.61 cm  LV e'  lateral:   6.74 cm/s LVOT diam:     2.20 cm  LV E/e' lateral: 17.4 LV SV:         105 LV SV Index:   52 LVOT Area:     3.80 cm  RIGHT VENTRICLE RV S prime:     12.50 cm/s TAPSE (M-mode): 2.3 cm LEFT ATRIUM             Index       RIGHT ATRIUM           Index LA diam:        3.30 cm 1.64 cm/m  RA Area:     15.50 cm LA Vol (A2C):   66.9 ml 33.33 ml/m RA Volume:   39.50 ml  19.68 ml/m LA Vol  (A4C):   73.6 ml 36.67 ml/m LA Biplane Vol: 73.0 ml 36.37 ml/m  AORTIC VALVE                    PULMONIC VALVE AV Area (Vmax):    1.44 cm     PV Vmax:       0.96 m/s AV Area (Vmean):   1.75 cm     PV Peak grad:  3.7 mmHg AV Area (VTI):     1.87 cm AV Vmax:           311.00 cm/s AV Vmean:          187.000 cm/s AV VTI:            0.563 m AV Peak Grad:      38.7 mmHg AV Mean Grad:      16.0 mmHg LVOT Vmax:         118.00 cm/s LVOT Vmean:        85.900 cm/s LVOT VTI:          0.277 m LVOT/AV VTI ratio: 0.49 AI PHT:            334 msec  AORTA Ao Root diam: 3.20 cm MITRAL VALVE                TRICUSPID VALVE MV Area (PHT): 3.86 cm     TR Peak grad:   48.2 mmHg MV Peak grad:  11.8 mmHg    TR Vmax:        347.00 cm/s MV Mean grad:  6.0 mmHg MV Vmax:       1.72 m/s     SHUNTS MV Vmean:      119.0 cm/s   Systemic VTI:  0.28 m MV E velocity: 117.50 cm/s  Systemic Diam: 2.20 cm MV A velocity: 114.50 cm/s MV E/A ratio:  1.03 Neoma Laming MD Electronically signed by Neoma Laming MD Signature Date/Time: 05/01/2020/10:11:01 PM    Final    US THORACENTESIS ASP PLEURAL SPACE W/IMG GUIDE  Result Date: 05/02/2020 INDICATION: 64 year old with a right pleural effusion. Request for thoracentesis. EXAM: ULTRASOUND GUIDED RIGHT THORACENTESIS MEDICATIONS: None. COMPLICATIONS: None immediate. PROCEDURE: An ultrasound guided thoracentesis was thoroughly discussed with the patient and questions answered. The benefits, risks, alternatives and complications were also discussed. The patient understands and wishes to proceed with the procedure. Written consent was obtained. Ultrasound was performed to localize and mark an adequate pocket of fluid in the right chest. The area was then prepped and draped in the normal sterile fashion. 1% Lidocaine was used for local anesthesia. Under ultrasound guidance a 6 Fr Safe-T-Centesis catheter was introduced. Thoracentesis was performed. The catheter was removed and a  dressing applied. FINDINGS: A  total of approximately 1.6 L of yellow fluid was removed. Samples were sent to the laboratory as requested by the clinical team. IMPRESSION: Successful ultrasound guided right thoracentesis yielding 1.6 L of pleural fluid. Electronically Signed   By: Markus Daft M.D.   On: 05/02/2020 17:13    Cardiac Studies   Echo 05/01/20 Read by outside cardiologist  1. Left ventricular ejection fraction, by estimation, is 55 to 60%. The  left ventricle has normal function. The left ventricle has no regional  wall motion abnormalities. There is severe concentric left ventricular  hypertrophy. Left ventricular diastolic  parameters are consistent with Grade II diastolic dysfunction  (pseudonormalization).  2. Right ventricular systolic function is moderately reduced. The right  ventricular size is normal. Mildly increased right ventricular wall  thickness. There is moderately elevated pulmonary artery systolic  pressure.  3. Left atrial size was mildly dilated.  4. Right atrial size was mildly dilated.  5. Moderate pericardial effusion. The pericardial effusion is  circumferential.  6. The mitral valve is abnormal. Mild to moderate mitral valve  regurgitation.  7. The tricuspid valve is abnormal. Tricuspid valve regurgitation is mild  to moderate.  8. The aortic valve is abnormal. Aortic valve regurgitation is mild to  moderate. Mild to moderate aortic valve stenosis.    Patient Profile     64 y.o. female with history of HFpEF, HTN, CKD, self reported CVAs, and who is being seen today for the evaluation of acute on chronic diastolic HF.  Assessment & Plan    [facility] HFpEF --Reports improved breathing status and s/p R pleural thoracentesis.    Still volume up on exam.    Exacerbation of volume status thought 2/2 poor BP control and advanced CKD. EF of 55 to 60%, NRWMA and as above. Diuresis increased from yesterday with pt still reporting limited urine output.  ? Continue IV diuresis with  increased dose IV Lasix 80 mg twice daily. ? As previously noted, if not able to optimally diurese, dialysis may be required. Will defer to nephrology recommendations. ? Continue to monitor I's/O's, daily standing weights.  Net -890cc on 12/19. Still low urine output, despite increased dose IV diuresis.  Wt 91.5kg  890cc.  Daily BMET. Monitor electrolytes.  Cr 2.87  2.97 with BUN 79  75.   K 4.6  4.4.   CHF education. ReDS vest.   Pleural effusion s/p thoracentesis --Per report, 1.6L of yellow fluid removed for R lung and pending lab results / cytology results. Follow-up CXR showed decreased R pleural effusion without pneumothorax. Reports some tenderness s/p procedure and unsure yet if it helped her breathing. Continue to monitor.   Pericardial effusion --Exam not consistent with tamponade. No indication at this time for pericardiocentesis. Possibly uremic in etiology, given advanced renal disease.  Monitor closely. Recommend repeat limited echo in a few weeks to reassess and ensure stability.  Hypertension, poorly controlled --Continue IV lasix. --Increased to Coreg 6.25mg  BID, given continued elevated BP with report of racing HR and review of telemetry showing room in HR and vitals with room in BP.  --Continue current po hydralazine, which can be escalated if no further room in HR to escalate Coreg. Continue IV hydralazine as needed.  --No ACE/ARB given advanced CKD.   For questions or updates, please contact [place] Please consult [email or web address] for contact info under        Signed, Arvil Chaco, PA-C  05/03/2020, 8:47 AM

## 2020-05-04 DIAGNOSIS — E8779 Other fluid overload: Secondary | ICD-10-CM

## 2020-05-04 DIAGNOSIS — J90 Pleural effusion, not elsewhere classified: Secondary | ICD-10-CM

## 2020-05-04 DIAGNOSIS — I5031 Acute diastolic (congestive) heart failure: Secondary | ICD-10-CM | POA: Diagnosis present

## 2020-05-04 DIAGNOSIS — I5030 Unspecified diastolic (congestive) heart failure: Secondary | ICD-10-CM

## 2020-05-04 LAB — GLUCOSE, CAPILLARY
Glucose-Capillary: 115 mg/dL — ABNORMAL HIGH (ref 70–99)
Glucose-Capillary: 93 mg/dL (ref 70–99)
Glucose-Capillary: 111 mg/dL — ABNORMAL HIGH (ref 70–99)
Glucose-Capillary: 156 mg/dL — ABNORMAL HIGH (ref 70–99)

## 2020-05-04 LAB — RENAL FUNCTION PANEL
Albumin: 2.2 g/dL — ABNORMAL LOW (ref 3.5–5.0)
Anion gap: 8 (ref 5–15)
BUN: 84 mg/dL — ABNORMAL HIGH (ref 8–23)
CO2: 22 mmol/L (ref 22–32)
Calcium: 7.8 mg/dL — ABNORMAL LOW (ref 8.9–10.3)
Chloride: 109 mmol/L (ref 98–111)
Creatinine, Ser: 3.15 mg/dL — ABNORMAL HIGH (ref 0.44–1.00)
GFR, Estimated: 16 mL/min — ABNORMAL LOW (ref >60)
Glucose, Bld: 84 mg/dL (ref 70–99)
Phosphorus: 5.6 mg/dL — ABNORMAL HIGH (ref 2.5–4.6)
Potassium: 4.4 mmol/L (ref 3.5–5.1)
Sodium: 139 mmol/L (ref 135–145)

## 2020-05-04 LAB — PROTEIN, BODY FLUID (OTHER): Total Protein, Body Fluid Other: 1.2 g/dL

## 2020-05-04 LAB — HEPATITIS C ANTIBODY: HCV Ab: NONREACTIVE

## 2020-05-04 LAB — CYTOLOGY - NON PAP

## 2020-05-04 MED ORDER — POTASSIUM CHLORIDE CRYS ER 20 MEQ PO TBCR
20.0000 meq | EXTENDED_RELEASE_TABLET | Freq: Every day | ORAL | Status: DC
  Administered 2020-05-04: 13:00:00 20 meq via ORAL
  Filled 2020-05-04: qty 1

## 2020-05-04 MED ORDER — TORSEMIDE 20 MG PO TABS
40.0000 mg | ORAL_TABLET | Freq: Every day | ORAL | Status: DC
  Administered 2020-05-04 – 2020-05-06 (×3): 40 mg via ORAL
  Filled 2020-05-04 (×3): qty 2

## 2020-05-04 MED ORDER — HEPARIN SODIUM (PORCINE) 5000 UNIT/ML IJ SOLN
5000.0000 [IU] | Freq: Three times a day (TID) | INTRAMUSCULAR | Status: DC
  Administered 2020-05-04 – 2020-05-10 (×15): 5000 [IU] via SUBCUTANEOUS
  Filled 2020-05-04 (×15): qty 1

## 2020-05-04 MED ORDER — MELATONIN 5 MG PO TABS
5.0000 mg | ORAL_TABLET | Freq: Every evening | ORAL | Status: DC | PRN
Start: 2020-05-04 — End: 2020-05-21
  Administered 2020-05-04 – 2020-05-20 (×15): 5 mg via ORAL
  Filled 2020-05-04 (×16): qty 1

## 2020-05-04 NOTE — Progress Notes (Signed)
[place], [name] 05/04/20  Subjective:   Hospital day # 3  Doing fair today.  Diagnosed with moderate pericardial effusion, mild to moderate aortic insufficiency and aortic stenosis. Reports multiple stressors in family life Moved to [place] 1 to 2 weeks ago Her main concern is generalized weakness, especially in legs S/p thoracentesis rt lung - 1600 cc fluid removed-May 02, 2020  Renal: 12/21 0701 - 12/22 0700 In: 840 [P.O.:840] Out: 1150 [Urine:1150] Lab Results  Component Value Date   CREATININE 3.15 (H) 05/04/2020   CREATININE 2.97 (H) 05/03/2020   CREATININE 2.87 (H) 05/02/2020     Objective:  Vital signs in last 24 hours:  Temp:  [97.5 F (36.4 C)-99.6 F (37.6 C)] 97.5 F (36.4 C) (12/22 0738) Pulse Rate:  [68-79] 72 (12/22 0738) Resp:  [19] 19 (12/22 0738) BP: (137-159)/(61-79) 159/79 (12/22 0738) SpO2:  [95 %-98 %] 97 % (12/22 0738)  Weight change:  Filed Weights   04/30/20 2045 05/01/20 0327 05/03/20 0304  Weight: 91.7 kg 91.5 kg 89.7 kg    Intake/Output:    Intake/Output Summary (Last 24 hours) at 05/04/2020 1008 Last data filed at 05/04/2020 0738 Gross per 24 hour  Intake 840 ml  Output 1150 ml  Net -310 ml     Physical Exam: General:  Chronically ill-appearing, laying in the bed  HEENT  anicteric, moist oral mucous membranes  Pulm/lungs  normal breathing effort  CVS/Heart  regular, no rub  Abdomen:   Soft, nontender  Extremities: + Dependent edema  Neurologic:  Alert, able to answer questions  Skin:  No acute rashes    Basic Metabolic Panel:  Recent Labs  Lab 04/30/20 1158 05/01/20 0519 05/02/20 0507 05/03/20 0408 05/04/20 0604  NA 143 141 142 139 139  K 5.6* 5.0 4.6 4.4 4.4  CL 115* 114* 113* 110 109  CO2 20* 20* 20* 21* 22  GLUCOSE 116* 85 88 138* 84  BUN 83* 84* 79* 75* 84*  CREATININE 2.97* 2.93* 2.87* 2.97* 3.15*  CALCIUM 8.5* 8.1* 8.3* 8.0* 7.8*  PHOS  --   --  5.6* 5.4*  5.6*     CBC: Recent Labs  Lab 04/30/20 1158 05/01/20 0519  WBC 9.2 6.8  HGB 10.5* 8.6*  HCT 34.4* 27.8*  MCV 101.8* 101.1*  PLT 197 150     No results found for: HEPBSAG, HEPBSAB, HEPBIGM    Microbiology:  Recent Results (from the past 240 hour(s))  Resp Panel by RT-PCR (Flu A&B, Covid) Nasopharyngeal Swab     Status: None   Collection Time: 04/30/20  4:01 PM   Specimen: Nasopharyngeal Swab; Nasopharyngeal(NP) swabs in vial transport medium  Result Value Ref Range Status   SARS Coronavirus 2 by RT PCR NEGATIVE NEGATIVE Final    Comment: (NOTE) SARS-CoV-2 target nucleic acids are NOT DETECTED.  The SARS-CoV-2 RNA is generally detectable in upper respiratory specimens during the acute phase of infection. The lowest concentration of SARS-CoV-2 viral copies this assay can detect is 138 copies/mL. A negative result does not preclude SARS-Cov-2 infection and should not be used as the sole basis for treatment or other patient management decisions. A negative result may occur with  improper specimen collection/handling, submission of specimen other than nasopharyngeal swab, presence of viral mutation(s) within the areas targeted by this assay, and inadequate number of viral copies(<138 copies/mL). A negative result must be combined with clinical observations, patient history, and epidemiological information. The expected result is Negative.  Fact Sheet for  Patients:  EntrepreneurPulse.com.au  Fact Sheet for Healthcare Providers:  IncredibleEmployment.be  This test is no t yet approved or cleared by the [place] FDA and  has been authorized for detection and/or diagnosis of SARS-CoV-2 by FDA under an Emergency Use Authorization (EUA). This EUA will remain  in effect (meaning this test can be used) for the duration of the COVID-19 declaration under Section 564(b)(1) of the Act, 21 U.S.C.section 360bbb-3(b)(1), unless the  authorization is terminated  or revoked sooner.       Influenza A by PCR NEGATIVE NEGATIVE Final   Influenza B by PCR NEGATIVE NEGATIVE Final    Comment: (NOTE) The Xpert Xpress SARS-CoV-2/FLU/RSV plus assay is intended as an aid in the diagnosis of influenza from Nasopharyngeal swab specimens and should not be used as a sole basis for treatment. Nasal washings and aspirates are unacceptable for Xpert Xpress SARS-CoV-2/FLU/RSV testing.  Fact Sheet for Patients: EntrepreneurPulse.com.au  Fact Sheet for Healthcare Providers: IncredibleEmployment.be  This test is not yet approved or cleared by the [place] FDA and has been authorized for detection and/or diagnosis of SARS-CoV-2 by FDA under an Emergency Use Authorization (EUA). This EUA will remain in effect (meaning this test can be used) for the duration of the COVID-19 declaration under Section 564(b)(1) of the Act, 21 U.S.C. section 360bbb-3(b)(1), unless the authorization is terminated or revoked.  Performed at [facility], [address] [place], [place] [address]   Body fluid culture     Status: None (Preliminary result)   Collection Time: 05/02/20  4:44 PM   Specimen: PATH Cytology Pleural fluid  Result Value Ref Range Status   Specimen Description   Final    PLEURAL Performed at [facility], [address], [place], [place] [address]    Special Requests   Final    NONE Performed at [facility], [address] [place], [place] [address]    Gram Stain   Final    MODERATE WBC PRESENT, PREDOMINANTLY MONONUCLEAR NO ORGANISMS SEEN    Culture   Final    NO GROWTH < 12 HOURS Performed at [facility], [place] [address] [place], [place] [address]    Report Status PENDING  Incomplete    Coagulation Studies: No results for input(s): LABPROT, INR in the last 72 hours.  Urinalysis: Recent Labs    05/03/20 1158  COLORURINE YELLOW*   LABSPEC 1.011  PHURINE 5.0  GLUCOSEU 50*  HGBUR SMALL*  BILIRUBINUR NEGATIVE  KETONESUR NEGATIVE  PROTEINUR >=300*  NITRITE NEGATIVE  LEUKOCYTESUR NEGATIVE      Imaging: DG Chest 1 View  Result Date: 05/02/2020 CLINICAL DATA:  Follow-up pleural effusions status post thoracentesis. EXAM: CHEST  1 VIEW COMPARISON:  05/01/2020 FINDINGS: Stable cardiomediastinal contours. Decreased volume of right pleural effusion status post thoracentesis. No pneumothorax identified. Stable appearance of left pleural effusion. IMPRESSION: Decreased volume of right pleural effusion status post thoracentesis. No pneumothorax. Electronically Signed   By: Kerby Moors M.D.   On: 05/02/2020 17:15   US THORACENTESIS ASP PLEURAL SPACE W/IMG GUIDE  Result Date: 05/02/2020 INDICATION: 64 year old with a right pleural effusion. Request for thoracentesis. EXAM: ULTRASOUND GUIDED RIGHT THORACENTESIS MEDICATIONS: None. COMPLICATIONS: None immediate. PROCEDURE: An ultrasound guided thoracentesis was thoroughly discussed with the patient and questions answered. The benefits, risks, alternatives and complications were also discussed. The patient understands and wishes to proceed with the procedure. Written consent was obtained. Ultrasound was performed to localize and mark an adequate pocket of fluid  in the right chest. The area was then prepped and draped in the normal sterile fashion. 1% Lidocaine was used for local anesthesia. Under ultrasound guidance a 6 Fr Safe-T-Centesis catheter was introduced. Thoracentesis was performed. The catheter was removed and a dressing applied. FINDINGS: A total of approximately 1.6 L of yellow fluid was removed. Samples were sent to the laboratory as requested by the clinical team. IMPRESSION: Successful ultrasound guided right thoracentesis yielding 1.6 L of pleural fluid. Electronically Signed   By: Markus Daft M.D.   On: 05/02/2020 17:13     Medications:     amLODipine  10 mg Oral  Daily   carvedilol  6.25 mg Oral BID WC   enoxaparin (LOVENOX) injection  30 mg Subcutaneous Q24H   hydrALAZINE  25 mg Oral TID   insulin aspart  0-9 Units Subcutaneous TID WC   potassium chloride  20 mEq Oral Daily   sodium bicarbonate  650 mg Oral TID   torsemide  40 mg Oral Daily   acetaminophen **OR** acetaminophen, hydrALAZINE, ondansetron **OR** ondansetron (ZOFRAN) IV, oxyCODONE-acetaminophen  Assessment/ Plan:  64 y.o. female with heart failure with preserved EF, hypertension, CKD, stroke   admitted on 04/30/2020 for Chronic kidney disease (CKD), stage IV (severe) (HCC) [N18.4] Volume overload [E87.70]  #Acute kidney injury on probable chronic kidney disease stage IV   Current creatinine of 2.87, GFR 18 Awaiting records from previous nephrology evaluation in [place].   Renal ultrasound shows echogenic kidneys Hemoglobin A1c 4.7% May 01, 2020 Underlying CKD likely secondary to hypertension and atherosclerosis  #Proteinuria Urine protein to creatinine ratio 5.35 g May 03, 2020 Serological studies ordered.  Results pending   #Severe hypertension, lower extremity edema 2D echo with LVEF 55 to 60%, moderately elevated pulmonary artery systolic pressures, moderate pericardial effusion, Mild to moderate aortic stenosis  currently managed with IV furosemide 80 mg twice daily Changed to oral torsemide in anticipation for discharge   LOS: 3 Temprance Wyre 12/22/202110:08 AM  [place], [place]  Note: This note was prepared with Dragon dictation. Any transcription errors are unintentional

## 2020-05-04 NOTE — Progress Notes (Addendum)
err

## 2020-05-04 NOTE — Progress Notes (Signed)
PROGRESS NOTE    Katie [name]  WVP:[id number] DOB: 1956/02/17 DOA: 04/30/2020 PCP: Patient, [name]   Brief Narrative:   Katie [name] is a 64 y.o. female with a history of diabetes mellitus type 2, stage IV CKD, heart failure, hypertension. Patient presented secondary to dyspnea with evidence of volume overload and pleural effusion.  12/22: Patient still edematous.  Net -3 L since admission.  Creatinine worsening.  Seen by cardiology nephrology this morning.    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)   Hyperkalemia   Pressure injury of skin  Acute on chronic decompensated diastolic heart failure In setting of uncontrolled hypertension Remains volume overloaded Plan: Continue diuretics per nephrology recommendations Monitor kidney function carefully Monitor and replace electrolytes Continue Coreg and hydralazine No ACE inhibitor due to AKI Strict I's and O's Daily weights  Pericardial effusion Moderate size on echo No indication of tamponade Repeat limited echo in a few weeks    Pleural effusion Status post ultrasound-guided thoracentesis 05/02/2020 Suspect transudate of process secondary to decompensated heart failure Notification of hemorrhagic or infected effusion  Diabetes mellitus, type 2 Hemoglobin A1C of 4.7%. No need for pharmacologic treatment. Diet control.  Acute kidney injury on chronic kidney disease stage IV Unknown baseline.  Patient is unsure of her primary nephrologist in [place].  Creatinine of 2.97 on admission.  Per patient, she was planned to start hemodialysis in addition to consideration for renal transplant.  Nephrology consulted on 12/20 Plan: Torsemide per nephrology Avoid nonessential nephrotoxins Daily renal function   Mild/moderate aortic  stenosis Unsure if this is symptomatic.  Patient does have dyspnea with exertion but this may be multifactorial. -Cardiology recommendations: outpatient Transthoracic Echocardiogram   Essential hypertension Patient is on at least hydrochlorothiazide as an outpatient but is unsure of her regimen. Currently uncontrolled. -Amlodipine 10 mg daily; continue diuresis -Cardiology added Coreg -Continue hydralazine 10 mg q6 hours prn  Anemia Possible related to underlying kidney disease.  Evidence of macrocytosis on CBC.  TIBC low but otherwise normal iron panel.  Vitamin B12 and folate normal.  Hyperkalemia In setting of CKD.  Lokelma 10 g given with improvement.  Pressure injury Mid coccyx, POA  History of tobacco use Patient has quit for at least 10 years.  Previously, 20 pack year history.  Headache Normal chronic headache. Patient states she takes Imitrex -Imitrex  Obesity Body mass index is 31.92 kg/m.   DVT prophylaxis: SQ heparin Code Status: Full Family Communication: None today  disposition Plan: Status is: Inpatient  Remains inpatient appropriate because:Inpatient level of care appropriate due to severity of illness   Dispo: The patient is from: Home              Anticipated d/c is to: SNF              Anticipated d/c date is: 2 days              Patient currently is not medically stable to d/c.   Still with clinical evidence of volume overload in the setting of decompensated heart failure.  Also with worsening creatinine.  Anticipate 48 hours before disposition.  Anticipate disposition to skilled nursing facility.      Consultants:   Cardiology-[facility]  Nephrology-[facility]  Procedures:   Ultrasound-guided thoracentesis, 05/02/2020  Antimicrobials:   None   Subjective: Patient seen and examined.  Reports improvement in shortness of breath.  Still some volume overload on exam.  No pain complaints.  Objective: Vitals:    05/03/20 1600 05/03/20 2000 05/04/20 0525 05/04/20 0738  BP: (!) 144/69 (!) 146/72 (!) 153/71 (!) 159/79  Pulse: 79 68 72 72  Resp:    19  Temp: 98.8 F (37.1 C) 98.2 F (36.8 C) 98.1 F (36.7 C) (!) 97.5 F (36.4 C)  TempSrc: Oral Oral Oral Oral  SpO2: 97% 98% 98% 97%  Weight:      Height:        Intake/Output Summary (Last 24 hours) at 05/04/2020 1152 Last data filed at 05/04/2020 1021 Gross per 24 hour  Intake 960 ml  Output 1150 ml  Net -190 ml   Filed Weights   04/30/20 2045 05/01/20 0327 05/03/20 0304  Weight: 91.7 kg 91.5 kg 89.7 kg    Examination:  General exam: Appears calm and comfortable  Respiratory system: Bibasilar crackles.  Normal work of breathing.  Room air Cardiovascular system: S1-S2 heard, regular rate and rhythm, heart sounds distant, 2+ pitting edema bilaterally gastrointestinal system: Abdomen is nondistended, soft and nontender. No organomegaly or masses felt. Normal bowel sounds heard. Central nervous system: Alert and oriented. No focal neurological deficits. Extremities: Symmetric 5 x 5 power. Skin: No rashes, lesions or ulcers Psychiatry: Judgement and insight appear normal. Mood & affect appropriate.     Data Reviewed: I have personally reviewed following labs and imaging studies  CBC: Recent Labs  Lab 04/30/20 1158 05/01/20 0519  WBC 9.2 6.8  HGB 10.5* 8.6*  HCT 34.4* 27.8*  MCV 101.8* 101.1*  PLT 197 314   Basic Metabolic Panel: Recent Labs  Lab 04/30/20 1158 05/01/20 0519 05/02/20 0507 05/03/20 0408 05/04/20 0604  NA 143 141 142 139 139  K 5.6* 5.0 4.6 4.4 4.4  CL 115* 114* 113* 110 109  CO2 20* 20* 20* 21* 22  GLUCOSE 116* 85 88 138* 84  BUN 83* 84* 79* 75* 84*  CREATININE 2.97* 2.93* 2.87* 2.97* 3.15*  CALCIUM 8.5* 8.1* 8.3* 8.0* 7.8*  PHOS  --   --  5.6* 5.4* 5.6*   GFR: Estimated Creatinine Clearance: 21.2 mL/min (A) (by C-G formula based on SCr of 3.15 mg/dL (H)). Liver Function Tests: Recent Labs  Lab  04/30/20 1601 05/02/20 0507 05/02/20 1100 05/03/20 0408 05/04/20 0604  AST 20  --   --   --   --   ALT 26  --   --   --   --   ALKPHOS 68  --   --   --   --   BILITOT 0.9  --   --   --   --   PROT 6.6  --  5.6*  --   --   ALBUMIN 3.2* 2.6*  --  2.5* 2.2*   No results for input(s): LIPASE, AMYLASE in the last 168 hours. No results for input(s): AMMONIA in the last 168 hours. Coagulation Profile: No results for input(s): INR, PROTIME in the last 168 hours. Cardiac Enzymes: No results for input(s): CKTOTAL, CKMB, CKMBINDEX, TROPONINI in the last 168 hours. BNP (last 3 results) No results for input(s): PROBNP in the last 8760 hours. HbA1C: No results for input(s): HGBA1C in the last 72 hours. CBG: Recent Labs  Lab 05/03/20 1148 05/03/20 1644 05/03/20 2042 05/04/20 0736 05/04/20 1138  GLUCAP 170* 107* 140* 115* 93   Lipid  Profile: Recent Labs    05/03/20 0408  CHOL 137  HDL 51  LDLCALC 75  TRIG 55  CHOLHDL 2.7   Thyroid Function Tests: No results for input(s): TSH, T4TOTAL, FREET4, T3FREE, THYROIDAB in the last 72 hours. Anemia Panel: Recent Labs    05/01/20 1802 05/02/20 0507  VITAMINB12  --  340  FOLATE  --  21.2  FERRITIN  --  260  TIBC  --  181*  IRON  --  46  RETICCTPCT 2.5  --    Sepsis Labs: No results for input(s): PROCALCITON, LATICACIDVEN in the last 168 hours.  Recent Results (from the past 240 hour(s))  Resp Panel by RT-PCR (Flu A&B, Covid) Nasopharyngeal Swab     Status: None   Collection Time: 04/30/20  4:01 PM   Specimen: Nasopharyngeal Swab; Nasopharyngeal(NP) swabs in vial transport medium  Result Value Ref Range Status   SARS Coronavirus 2 by RT PCR NEGATIVE NEGATIVE Final    Comment: (NOTE) SARS-CoV-2 target nucleic acids are NOT DETECTED.  The SARS-CoV-2 RNA is generally detectable in upper respiratory specimens during the acute phase of infection. The lowest concentration of SARS-CoV-2 viral copies this assay can detect is 138  copies/mL. A negative result does not preclude SARS-Cov-2 infection and should not be used as the sole basis for treatment or other patient management decisions. A negative result may occur with  improper specimen collection/handling, submission of specimen other than nasopharyngeal swab, presence of viral mutation(s) within the areas targeted by this assay, and inadequate number of viral copies(<138 copies/mL). A negative result must be combined with clinical observations, patient history, and epidemiological information. The expected result is Negative.  Fact Sheet for Patients:  EntrepreneurPulse.com.au  Fact Sheet for Healthcare Providers:  IncredibleEmployment.be  This test is no t yet approved or cleared by the [place] FDA and  has been authorized for detection and/or diagnosis of SARS-CoV-2 by FDA under an Emergency Use Authorization (EUA). This EUA will remain  in effect (meaning this test can be used) for the duration of the COVID-19 declaration under Section 564(b)(1) of the Act, 21 U.S.C.section 360bbb-3(b)(1), unless the authorization is terminated  or revoked sooner.       Influenza A by PCR NEGATIVE NEGATIVE Final   Influenza B by PCR NEGATIVE NEGATIVE Final    Comment: (NOTE) The Xpert Xpress SARS-CoV-2/FLU/RSV plus assay is intended as an aid in the diagnosis of influenza from Nasopharyngeal swab specimens and should not be used as a sole basis for treatment. Nasal washings and aspirates are unacceptable for Xpert Xpress SARS-CoV-2/FLU/RSV testing.  Fact Sheet for Patients: EntrepreneurPulse.com.au  Fact Sheet for Healthcare Providers: IncredibleEmployment.be  This test is not yet approved or cleared by the [place] FDA and has been authorized for detection and/or diagnosis of SARS-CoV-2 by FDA under an Emergency Use Authorization (EUA). This EUA will remain in effect (meaning  this test can be used) for the duration of the COVID-19 declaration under Section 564(b)(1) of the Act, 21 U.S.C. section 360bbb-3(b)(1), unless the authorization is terminated or revoked.  Performed at [facility], [address] [place], [place] [address]   Body fluid culture     Status: None (Preliminary result)   Collection Time: 05/02/20  4:44 PM   Specimen: PATH Cytology Pleural fluid  Result Value Ref Range Status   Specimen Description   Final    PLEURAL Performed at [facility], [address] [place], [place] [address]    Special Requests  Final    NONE Performed at [facility], [address] [place], [place] [address]    Gram Stain   Final    MODERATE WBC PRESENT, PREDOMINANTLY MONONUCLEAR NO ORGANISMS SEEN    Culture   Final    NO GROWTH 2 DAYS Performed at [facility], [place] [address] [place], [place] [address]    Report Status PENDING  Incomplete         Radiology Studies: DG Chest 1 View  Result Date: 05/02/2020 CLINICAL DATA:  Follow-up pleural effusions status post thoracentesis. EXAM: CHEST  1 VIEW COMPARISON:  05/01/2020 FINDINGS: Stable cardiomediastinal contours. Decreased volume of right pleural effusion status post thoracentesis. No pneumothorax identified. Stable appearance of left pleural effusion. IMPRESSION: Decreased volume of right pleural effusion status post thoracentesis. No pneumothorax. Electronically Signed   By: Kerby Moors M.D.   On: 05/02/2020 17:15   US THORACENTESIS ASP PLEURAL SPACE W/IMG GUIDE  Result Date: 05/02/2020 INDICATION: 64 year old with a right pleural effusion. Request for thoracentesis. EXAM: ULTRASOUND GUIDED RIGHT THORACENTESIS MEDICATIONS: None. COMPLICATIONS: None immediate. PROCEDURE: An ultrasound guided thoracentesis was thoroughly discussed with the patient and questions answered. The benefits, risks, alternatives and complications were also discussed. The patient  understands and wishes to proceed with the procedure. Written consent was obtained. Ultrasound was performed to localize and mark an adequate pocket of fluid in the right chest. The area was then prepped and draped in the normal sterile fashion. 1% Lidocaine was used for local anesthesia. Under ultrasound guidance a 6 Fr Safe-T-Centesis catheter was introduced. Thoracentesis was performed. The catheter was removed and a dressing applied. FINDINGS: A total of approximately 1.6 L of yellow fluid was removed. Samples were sent to the laboratory as requested by the clinical team. IMPRESSION: Successful ultrasound guided right thoracentesis yielding 1.6 L of pleural fluid. Electronically Signed   By: Markus Daft M.D.   On: 05/02/2020 17:13        Scheduled Meds: . amLODipine  10 mg Oral Daily  . carvedilol  6.25 mg Oral BID WC  . enoxaparin (LOVENOX) injection  30 mg Subcutaneous Q24H  . hydrALAZINE  25 mg Oral TID  . insulin aspart  0-9 Units Subcutaneous TID WC  . potassium chloride  20 mEq Oral Daily  . sodium bicarbonate  650 mg Oral TID  . torsemide  40 mg Oral Daily   Continuous Infusions:   LOS: 3 days    Time spent: 25 minutes    Sidney Ace, MD [facility] Pager [phone]  If 7PM-7AM, please contact night-coverage 05/04/2020, 11:52 AM

## 2020-05-04 NOTE — TOC Initial Note (Addendum)
Transition of Care [facility]) - Initial/Assessment Note    Patient Details  Name: Katie [name] MRN: [id number] Date of Birth: July 12, 1955  Transition of Care [facility]) CM/SW Contact:    Kerin Salen, RN Phone Number: 05/04/2020, 9:35 AM  Clinical Narrative:  TOC spoke with patient, who just relocated from [place] to live with a friend, Howard Pouch [phone], due to family issues. Patient states she arrived for one day and had to come to the hospital ED for shortness of breath. Not knowledgeable of medications, states she had only a few pills left, no Insurance and no PCP. Patient states she was living with sister and her family and they told her they could no longer take care of her due to frequent falls and inability to help around the house. Patient given information about difficulty finding SNF without Insurance and if best friend will allow her to stay at her place with home health services, patient says yes. I did ask if I could call friend and she agrees. Attempted to call [facility], no answer, left voice message to return call. Will continue to assist with discharge needs.                Patient's friend Howard Pouch returned call states she will be available to assist with discharge needs. Patient will be able to stay in her home and have [place] services if needed. Ms Marvel Plan will come to visit today and discuss discharge needs.   Expected Discharge Plan: Skilled Nursing Facility Barriers to Discharge: Continued Medical Work up,Homeless with medical needs   Patient Goals and CMS Choice Patient states their goals for this hospitalization and ongoing recovery are:: Need a Rehabilitation Facility to get strong, having problems with frequent falls   Choice offered to / list presented to : NA  Expected Discharge Plan and Services Expected Discharge Plan: [place] In-house Referral: Clinical Social Work Discharge Planning Services: Homebound not met per provider Post  Acute Care Choice: [place] Living arrangements for the past 2 months: No permanent address (Lived with Sister and family)                 DME Arranged: N/A DME Agency: NA       HH Arranged: NA HH Agency: NA        Prior Living Arrangements/Services Living arrangements for the past 2 months: No permanent address (Lived with Sister and family) Lives with:: Relatives Patient language and need for interpreter reviewed:: Yes Do you feel safe going back to the place where you live?: No   Need Rehabilitation, due to frequent falls and inability to walk.  Need for Family Participation in Patient Care: No (Comment) Care giver support system in place?: Yes (comment) (Friend will assist.)   Criminal Activity/Legal Involvement Pertinent to Current Situation/Hospitalization: No - Comment as needed  Activities of Daily Living Home Assistive Devices/Equipment: Cane (specify quad or straight) ADL Screening (condition at time of admission) Patient's cognitive ability adequate to safely complete daily activities?: Yes Is the patient deaf or have difficulty hearing?: No Does the patient have difficulty seeing, even when wearing glasses/contacts?: No Does the patient have difficulty concentrating, remembering, or making decisions?: No Patient able to express need for assistance with ADLs?: Yes Does the patient have difficulty dressing or bathing?: Yes Independently performs ADLs?: Yes (appropriate for developmental age) Does the patient have difficulty walking or climbing stairs?: Yes Weakness of Legs: Both Weakness of Arms/Hands: None  Permission Sought/Granted  Permission granted to share info w Relationship: Howard Pouch [phone]     Emotional Assessment Appearance:: Appears stated age Attitude/Demeanor/Rapport: Engaged,Self-Confident Affect (typically observed): Accepting Orientation: : Oriented to Self,Oriented to Place,Oriented to  Time,Oriented to  Situation Alcohol / Substance Use: Not Applicable Psych Involvement: No (comment)  Admission diagnosis:  Chronic kidney disease (CKD), stage IV (severe) (HCC) [N18.4] Volume overload [E87.70] Patient Active Problem List   Diagnosis Date Noted  . Pressure injury of skin 05/01/2020  . Hyperkalemia   . Volume overload 04/30/2020  . Type 2 diabetes mellitus with stage 5 chronic kidney disease ([place]) 04/30/2020  . Hypertension   . 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[place])   . Type 2 diabetes mellitus ([place])    PCP:  Patient, [name] Pharmacy:   [id number] [place] [id number]: [phone]: [phone]    Social Determinants of Health (SDOH) Interventions    Readmission Risk Interventions No flowsheet data found.

## 2020-05-04 NOTE — Progress Notes (Signed)
Mobility Specialist - Progress Note   05/04/20 1600  Mobility  Activity Refused mobility  Mobility performed by Mobility specialist    Pt politely declined mobility this date d/t "my head is pounding". Pt stated that nurse was already made aware of pain, but she wanted to hold off on medication at this time. Will attempt session at another date/time.    Kathee Delton Mobility Specialist 05/04/20, 4:28 PM

## 2020-05-04 NOTE — Progress Notes (Signed)
Progress Note  Patient Name: Katie [name] Date of Encounter: 05/04/2020  [facility] Cardiologist: Dr. Fletcher Anon  Subjective   Feels a little better compared to admission.  Lower extremity swelling also better.  Had right thoracentesis 2 days ago.  Mild discomfort in the area with taking deep breaths.  Inpatient Medications    Scheduled Meds: . amLODipine  10 mg Oral Daily  . carvedilol  6.25 mg Oral BID WC  . heparin  5,000 Units Subcutaneous Q8H  . hydrALAZINE  25 mg Oral TID  . insulin aspart  0-9 Units Subcutaneous TID WC  . potassium chloride  20 mEq Oral Daily  . sodium bicarbonate  650 mg Oral TID  . torsemide  40 mg Oral Daily   Continuous Infusions:  PRN Meds: acetaminophen **OR** acetaminophen, hydrALAZINE, ondansetron **OR** ondansetron (ZOFRAN) IV, oxyCODONE-acetaminophen   Vital Signs    Vitals:   05/03/20 2000 05/04/20 0525 05/04/20 0738 05/04/20 1154  BP: (!) 146/72 (!) 153/71 (!) 159/79 (!) 154/77  Pulse: 68 72 72 72  Resp:   19 18  Temp: 98.2 F (36.8 C) 98.1 F (36.7 C) (!) 97.5 F (36.4 C) 97.8 F (36.6 C)  TempSrc: Oral Oral Oral Oral  SpO2: 98% 98% 97% 95%  Weight:      Height:        Intake/Output Summary (Last 24 hours) at 05/04/2020 1300 Last data filed at 05/04/2020 1021 Gross per 24 hour  Intake 960 ml  Output 1050 ml  Net -90 ml   Last 3 Weights 05/03/2020 05/01/2020 04/30/2020  Weight (lbs) 197 lb 12 oz 201 lb 11.2 oz 202 lb 1.6 oz  Weight (kg) 89.7 kg 91.491 kg 91.672 kg      Telemetry    Sinus rhythm- Personally Reviewed  ECG    No new tracing obtained- Personally Reviewed  Physical Exam   GEN: No acute distress.   Neck: No JVD Cardiac: RRR, 2/6 systolic murmur Respiratory:  Decreased breath sounds at bases bilaterally GI: Soft, nontender, distended  MS:  2+ edema Neuro:  Nonfocal  Psych: Normal affect   Labs    High Sensitivity Troponin:   Recent Labs  Lab 04/30/20 1601 04/30/20 2114  TROPONINIHS 24*  21*      Chemistry Recent Labs  Lab 04/30/20 1601 05/01/20 0519 05/02/20 0507 05/02/20 1100 05/03/20 0408 05/04/20 0604  NA  --    < > 142  --  139 139  K  --    < > 4.6  --  4.4 4.4  CL  --    < > 113*  --  110 109  CO2  --    < > 20*  --  21* 22  GLUCOSE  --    < > 88  --  138* 84  BUN  --    < > 79*  --  75* 84*  CREATININE  --    < > 2.87*  --  2.97* 3.15*  CALCIUM  --    < > 8.3*  --  8.0* 7.8*  PROT 6.6  --   --  5.6*  --   --   ALBUMIN 3.2*  --  2.6*  --  2.5* 2.2*  AST 20  --   --   --   --   --   ALT 26  --   --   --   --   --   ALKPHOS 68  --   --   --   --   --  BILITOT 0.9  --   --   --   --   --   GFRNONAA  --    < > 18*  --  17* 16*  ANIONGAP  --    < > 9  --  8 8   < > = values in this interval not displayed.     Hematology Recent Labs  Lab 04/30/20 1158 05/01/20 0519 05/01/20 1802  WBC 9.2 6.8  --   RBC 3.38* 2.75* 3.03*  HGB 10.5* 8.6*  --   HCT 34.4* 27.8*  --   MCV 101.8* 101.1*  --   MCH 31.1 31.3  --   MCHC 30.5 30.9  --   RDW 14.6 14.4  --   PLT 197 150  --     BNP Recent Labs  Lab 04/30/20 1601  BNP 769.6*     DDimer No results for input(s): DDIMER in the last 168 hours.   Radiology    DG Chest 1 View  Result Date: 05/02/2020 CLINICAL DATA:  Follow-up pleural effusions status post thoracentesis. EXAM: CHEST  1 VIEW COMPARISON:  05/01/2020 FINDINGS: Stable cardiomediastinal contours. Decreased volume of right pleural effusion status post thoracentesis. No pneumothorax identified. Stable appearance of left pleural effusion. IMPRESSION: Decreased volume of right pleural effusion status post thoracentesis. No pneumothorax. Electronically Signed   By: Kerby Moors M.D.   On: 05/02/2020 17:15   US THORACENTESIS ASP PLEURAL SPACE W/IMG GUIDE  Result Date: 05/02/2020 INDICATION: 63 year old with a right pleural effusion. Request for thoracentesis. EXAM: ULTRASOUND GUIDED RIGHT THORACENTESIS MEDICATIONS: None. COMPLICATIONS: None  immediate. PROCEDURE: An ultrasound guided thoracentesis was thoroughly discussed with the patient and questions answered. The benefits, risks, alternatives and complications were also discussed. The patient understands and wishes to proceed with the procedure. Written consent was obtained. Ultrasound was performed to localize and mark an adequate pocket of fluid in the right chest. The area was then prepped and draped in the normal sterile fashion. 1% Lidocaine was used for local anesthesia. Under ultrasound guidance a 6 Fr Safe-T-Centesis catheter was introduced. Thoracentesis was performed. The catheter was removed and a dressing applied. FINDINGS: A total of approximately 1.6 L of yellow fluid was removed. Samples were sent to the laboratory as requested by the clinical team. IMPRESSION: Successful ultrasound guided right thoracentesis yielding 1.6 L of pleural fluid. Electronically Signed   By: Markus Daft M.D.   On: 05/02/2020 17:13    Cardiac Studies   Echo 04/2020 1. Left ventricular ejection fraction, by estimation, is 55 to 60%. The  left ventricle has normal function. The left ventricle has no regional  wall motion abnormalities. There is severe concentric left ventricular  hypertrophy. Left ventricular diastolic  parameters are consistent with Grade II diastolic dysfunction  (pseudonormalization).  2. Right ventricular systolic function is moderately reduced. The right  ventricular size is normal. Mildly increased right ventricular wall  thickness. There is moderately elevated pulmonary artery systolic  pressure.  3. Left atrial size was mildly dilated.  4. Right atrial size was mildly dilated.  5. Moderate pericardial effusion. The pericardial effusion is  circumferential.  6. The mitral valve is abnormal. Mild to moderate mitral valve  regurgitation.  7. The tricuspid valve is abnormal. Tricuspid valve regurgitation is mild  to moderate.  8. The aortic valve is abnormal.  Aortic valve regurgitation is mild to  moderate. Mild to moderate aortic valve stenosis.  Patient Profile     64 y.o. female history of HFpEF,  hypertension, CKD stage IV, presenting with shortness of breath and edema.  Echo showing grade 2 diastolic dysfunction/HFpEF, started on IV Lasix for diuresing.  Assessment & Plan    1.  HFpEF, -Shortness of breath improved still volume overloaded. -Worsening renal function -Switch IV Lasix to torsemide 40mg  qd -Monitor creatinine  2.  Hypertension -BP elevated but improving -Continue Coreg, Norvasc, hydralazine. -Consider up titration as BP permits if needed  3.  Mild to moderate aortic stenosis -Serial monitoring as outpatient with echo  4.  Pericardial effusion, moderate -Monitor pericardial effusions in the couple of weeks as outpatient  5.  CKD -Switch IV Lasix to torsemide -Nephrology following -Appreciate input    Total encounter time 35 minutes  Greater than 50% was spent in counseling and coordination of care with the patient     Signed, Kate Sable, MD  05/04/2020, 1:00 PM

## 2020-05-05 LAB — RENAL FUNCTION PANEL
Albumin: 2.2 g/dL — ABNORMAL LOW (ref 3.5–5.0)
Anion gap: 7 (ref 5–15)
BUN: 84 mg/dL — ABNORMAL HIGH (ref 8–23)
CO2: 22 mmol/L (ref 22–32)
Calcium: 7.6 mg/dL — ABNORMAL LOW (ref 8.9–10.3)
Chloride: 109 mmol/L (ref 98–111)
Creatinine, Ser: 3.06 mg/dL — ABNORMAL HIGH (ref 0.44–1.00)
GFR, Estimated: 17 mL/min — ABNORMAL LOW (ref >60)
Glucose, Bld: 85 mg/dL (ref 70–99)
Phosphorus: 5.4 mg/dL — ABNORMAL HIGH (ref 2.5–4.6)
Potassium: 4.6 mmol/L (ref 3.5–5.1)
Sodium: 138 mmol/L (ref 135–145)

## 2020-05-05 LAB — CBC
HCT: 30 % — ABNORMAL LOW (ref 36.0–46.0)
Hemoglobin: 9.7 g/dL — ABNORMAL LOW (ref 12.0–15.0)
MCH: 31.3 pg (ref 26.0–34.0)
MCHC: 32.3 g/dL (ref 30.0–36.0)
MCV: 96.8 fL (ref 80.0–100.0)
Platelets: 137 10*3/uL — ABNORMAL LOW (ref 150–400)
RBC: 3.1 MIL/uL — ABNORMAL LOW (ref 3.87–5.11)
RDW: 13.6 % (ref 11.5–15.5)
WBC: 6.3 10*3/uL (ref 4.0–10.5)
nRBC: 0 % (ref 0.0–0.2)

## 2020-05-05 LAB — PROTEIN ELECTROPHORESIS, SERUM
A/G Ratio: 1.1 (ref 0.7–1.7)
Albumin ELP: 2.1 g/dL — ABNORMAL LOW (ref 2.9–4.4)
Alpha-1-Globulin: 0.2 g/dL (ref 0.0–0.4)
Alpha-2-Globulin: 0.5 g/dL (ref 0.4–1.0)
Beta Globulin: 0.5 g/dL — ABNORMAL LOW (ref 0.7–1.3)
Gamma Globulin: 0.6 g/dL (ref 0.4–1.8)
Globulin, Total: 1.9 g/dL — ABNORMAL LOW (ref 2.2–3.9)
Total Protein ELP: 4 g/dL — ABNORMAL LOW (ref 6.0–8.5)

## 2020-05-05 LAB — ANCA TITERS
Atypical P-ANCA titer: <1:20 {titer}
C-ANCA: <1:20 {titer}
P-ANCA: <1:20 {titer}

## 2020-05-05 LAB — KAPPA/LAMBDA LIGHT CHAINS
Kappa free light chain: 68.7 mg/L — ABNORMAL HIGH (ref 3.3–19.4)
Kappa, lambda light chain ratio: 1.66 — ABNORMAL HIGH (ref 0.26–1.65)
Lambda free light chains: 41.5 mg/L — ABNORMAL HIGH (ref 5.7–26.3)

## 2020-05-05 LAB — GLUCOSE, CAPILLARY
Glucose-Capillary: 86 mg/dL (ref 70–99)
Glucose-Capillary: 127 mg/dL — ABNORMAL HIGH (ref 70–99)
Glucose-Capillary: 142 mg/dL — ABNORMAL HIGH (ref 70–99)
Glucose-Capillary: 127 mg/dL — ABNORMAL HIGH (ref 70–99)

## 2020-05-05 LAB — ANA W/REFLEX IF POSITIVE: Anti Nuclear Antibody (ANA): NEGATIVE

## 2020-05-05 MED ORDER — CARVEDILOL 6.25 MG PO TABS
12.5000 mg | ORAL_TABLET | Freq: Two times a day (BID) | ORAL | Status: DC
  Administered 2020-05-05 – 2020-06-24 (×88): 12.5 mg via ORAL
  Filled 2020-05-05 (×37): qty 1
  Filled 2020-05-05: qty 2
  Filled 2020-05-05 (×27): qty 1
  Filled 2020-05-05: qty 2
  Filled 2020-05-05 (×8): qty 1
  Filled 2020-05-05: qty 2
  Filled 2020-05-05 (×9): qty 1
  Filled 2020-05-05: qty 2
  Filled 2020-05-05 (×8): qty 1

## 2020-05-05 NOTE — Progress Notes (Addendum)
PROGRESS NOTE    Tammey Deeg  ZOX:[id number] DOB: 07-12-1955 DOA: 04/30/2020 PCP: Patient, [name]   Brief Narrative:   Freja Faro is a 64 y.o. female with a history of diabetes mellitus type 2, stage IV CKD, heart failure, hypertension. Patient presented secondary to dyspnea with evidence of volume overload and pleural effusion.  12/22: Patient still edematous.  Net -3 L since admission.  Creatinine worsening.  Seen by cardiology nephrology this morning.  12/23: Patient remains fluid overloaded with 2+ pitting edema bilateral lower extremities.  Concern regarding worsening renal function is limiting effective diuresis.  Patient net -3.7 L since admission.    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 Demadex due to worsening kidney function Plan: Continue diuretics per nephrology and cardiology recommendations Monitor kidney function carefully Monitor and replace electrolytes Continue Coreg and hydralazine No ACE inhibitor due to AKI Strict I's and O's Daily weights  Pericardial effusion Moderate size on echo No indication of tamponade Repeat limited echo in a few weeks  Pleural effusion Status post ultrasound-guided thoracentesis 05/02/2020 Suspect transudate of process secondary to decompensated heart failure Notification of hemorrhagic or infected effusion  Diabetes mellitus, type 2 Hemoglobin A1C of 4.7%. No need for pharmacologic treatment. Diet control.  Acute kidney injury on chronic kidney disease stage IV Unknown  baseline.  Patient is unsure of her primary nephrologist in [place].  Creatinine of 2.97 on admission.  Per patient, she was planned to start hemodialysis in addition to consideration for renal transplant.  Nephrology consulted on 12/20 Plan: Torsemide per nephrology Avoid nonessential nephrotoxins Daily renal function  Mild/moderate aortic stenosis Unsure if this is symptomatic.  Patient does have dyspnea with exertion but this may be multifactorial. -Cardiology recommendations: outpatient Transthoracic Echocardiogram   Essential hypertension Patient is on at least hydrochlorothiazide as an outpatient but is unsure of her regimen. Currently uncontrolled. -Amlodipine 10 mg daily; continue diuresis -Cardiology added Coreg.  Dose increased to 12.5 mg twice daily -Continue hydralazine p.o. 25 mg 3 times daily.  Uptitrate as necessary -Continue hydralazine 10 mg q6 hours prn  Anemia Possible related to underlying kidney disease.  Evidence of macrocytosis on CBC.  TIBC low but otherwise normal iron panel.  Vitamin B12 and folate normal.  Hyperkalemia In setting of CKD.  Lokelma 10 g given with improvement.  Pressure injury Mid coccyx, POA  History of tobacco use Patient has quit for at least 10 years.  Previously, 20 pack year history.  Headache Normal chronic headache. Patient states she takes Imitrex -Imitrex  Obesity Body mass index is 31.92 kg/m.   DVT prophylaxis: SQ heparin Code Status: Full Family Communication: Left VM for friend Howard Pouch [phone] on 05/05/2020 disposition Plan: Status is: Inpatient  Remains inpatient appropriate because:Inpatient level of care appropriate due to severity of illness   Dispo: The patient is from: Home              Anticipated d/c is to: Unknown at this time  Anticipated d/c date is: 2 days              Patient currently is not medically stable to d/c.   Remains inpatient appropriate for  continued treatment of decompensated congestive heart failure and suspected acute kidney injury on chronic kidney disease.  Disposition may be an issue.  Patient does not have a stable living situation [place].  She is living with a friend.  Will follow up with TOC   Consultants:   Cardiology-[facility]  Nephrology-[facility] [place] [facility]  Procedures:   Ultrasound-guided thoracentesis, 05/02/2020  Antimicrobials:   None   Subjective: Patient seen and examined.  Continues to endorse swollen feet.  Improvement in shortness of breath.  No pain complaints  Objective: Vitals:   05/04/20 1536 05/04/20 2018 05/05/20 0420 05/05/20 0732  BP: (!) 155/69 (!) 146/63 (!) 146/66 (!) 178/75  Pulse: 79 79 72 77  Resp: 18 18 17    Temp: 97.6 F (36.4 C) 98.6 F (37 C) 98.6 F (37 C) 98 F (36.7 C)  TempSrc: Oral Oral Oral Oral  SpO2: 97% 97% 95% 98%  Weight:   89.9 kg   Height:        Intake/Output Summary (Last 24 hours) at 05/05/2020 1052 Last data filed at 05/05/2020 1000 Gross per 24 hour  Intake 480 ml  Output 1050 ml  Net -570 ml   Filed Weights   05/01/20 0327 05/03/20 0304 05/05/20 0420  Weight: 91.5 kg 89.7 kg 89.9 kg    Examination:  General exam: Appears calm and comfortable  Respiratory system: Bibasilar crackles.  Normal work of breathing.  Room air Cardiovascular system: S1-S2 heard, regular rate and rhythm, heart sounds distant, 2+ pitting edema bilaterally gastrointestinal system: Abdomen is nondistended, soft and nontender. No organomegaly or masses felt. Normal bowel sounds heard. Central nervous system: Alert and oriented. No focal neurological deficits. Extremities: Symmetric 5 x 5 power. Skin: No rashes, lesions or ulcers Psychiatry: Judgement and insight appear normal. Mood & affect appropriate.     Data Reviewed: I have personally reviewed following labs and imaging studies  CBC: Recent Labs  Lab 04/30/20 1158 05/01/20 0519 05/05/20 0715   WBC 9.2 6.8 6.3  HGB 10.5* 8.6* 9.7*  HCT 34.4* 27.8* 30.0*  MCV 101.8* 101.1* 96.8  PLT 197 150 462*   Basic Metabolic Panel: Recent Labs  Lab 05/01/20 0519 05/02/20 0507 05/03/20 0408 05/04/20 0604 05/05/20 0551  NA 141 142 139 139 138  K 5.0 4.6 4.4 4.4 4.6  CL 114* 113* 110 109 109  CO2 20* 20* 21* 22 22  GLUCOSE 85 88 138* 84 85  BUN 84* 79* 75* 84* 84*  CREATININE 2.93* 2.87* 2.97* 3.15* 3.06*  CALCIUM 8.1* 8.3* 8.0* 7.8* 7.6*  PHOS  --  5.6* 5.4* 5.6* 5.4*   GFR: Estimated Creatinine Clearance: 21.8 mL/min (A) (by C-G formula based on SCr of 3.06 mg/dL (H)). Liver Function Tests: Recent Labs  Lab 04/30/20 1601 05/02/20 0507 05/02/20 1100 05/03/20 0408 05/04/20 0604 05/05/20 0551  AST 20  --   --   --   --   --   ALT 26  --   --   --   --   --   ALKPHOS 68  --   --   --   --   --   BILITOT 0.9  --   --   --   --   --   PROT 6.6  --  5.6*  --   --   --  ALBUMIN 3.2* 2.6*  --  2.5* 2.2* 2.2*   No results for input(s): LIPASE, AMYLASE in the last 168 hours. No results for input(s): AMMONIA in the last 168 hours. Coagulation Profile: No results for input(s): INR, PROTIME in the last 168 hours. Cardiac Enzymes: No results for input(s): CKTOTAL, CKMB, CKMBINDEX, TROPONINI in the last 168 hours. BNP (last 3 results) No results for input(s): PROBNP in the last 8760 hours. HbA1C: No results for input(s): HGBA1C in the last 72 hours. CBG: Recent Labs  Lab 05/04/20 0736 05/04/20 1138 05/04/20 1708 05/04/20 2019 05/05/20 0733  GLUCAP 115* 93 111* 156* 86   Lipid Profile: Recent Labs    05/03/20 0408  CHOL 137  HDL 51  LDLCALC 75  TRIG 55  CHOLHDL 2.7   Thyroid Function Tests: No results for input(s): TSH, T4TOTAL, FREET4, T3FREE, THYROIDAB in the last 72 hours. Anemia Panel: No results for input(s): VITAMINB12, FOLATE, FERRITIN, TIBC, IRON, RETICCTPCT in the last 72 hours. Sepsis Labs: No results for input(s): PROCALCITON, LATICACIDVEN in the  last 168 hours.  Recent Results (from the past 240 hour(s))  Resp Panel by RT-PCR (Flu A&B, Covid) Nasopharyngeal Swab     Status: None   Collection Time: 04/30/20  4:01 PM   Specimen: Nasopharyngeal Swab; Nasopharyngeal(NP) swabs in vial transport medium  Result Value Ref Range Status   SARS Coronavirus 2 by RT PCR NEGATIVE NEGATIVE Final    Comment: (NOTE) SARS-CoV-2 target nucleic acids are NOT DETECTED.  The SARS-CoV-2 RNA is generally detectable in upper respiratory specimens during the acute phase of infection. The lowest concentration of SARS-CoV-2 viral copies this assay can detect is 138 copies/mL. A negative result does not preclude SARS-Cov-2 infection and should not be used as the sole basis for treatment or other patient management decisions. A negative result may occur with  improper specimen collection/handling, submission of specimen other than nasopharyngeal swab, presence of viral mutation(s) within the areas targeted by this assay, and inadequate number of viral copies(<138 copies/mL). A negative result must be combined with clinical observations, patient history, and epidemiological information. The expected result is Negative.  Fact Sheet for Patients:  EntrepreneurPulse.com.au  Fact Sheet for Healthcare Providers:  IncredibleEmployment.be  This test is no t yet approved or cleared by the [place] FDA and  has been authorized for detection and/or diagnosis of SARS-CoV-2 by FDA under an Emergency Use Authorization (EUA). This EUA will remain  in effect (meaning this test can be used) for the duration of the COVID-19 declaration under Section 564(b)(1) of the Act, 21 U.S.C.section 360bbb-3(b)(1), unless the authorization is terminated  or revoked sooner.       Influenza A by PCR NEGATIVE NEGATIVE Final   Influenza B by PCR NEGATIVE NEGATIVE Final    Comment: (NOTE) The Xpert Xpress SARS-CoV-2/FLU/RSV plus assay is  intended as an aid in the diagnosis of influenza from Nasopharyngeal swab specimens and should not be used as a sole basis for treatment. Nasal washings and aspirates are unacceptable for Xpert Xpress SARS-CoV-2/FLU/RSV testing.  Fact Sheet for Patients: EntrepreneurPulse.com.au  Fact Sheet for Healthcare Providers: IncredibleEmployment.be  This test is not yet approved or cleared by the [place] FDA and has been authorized for detection and/or diagnosis of SARS-CoV-2 by FDA under an Emergency Use Authorization (EUA). This EUA will remain in effect (meaning this test can be used) for the duration of the COVID-19 declaration under Section 564(b)(1) of the Act, 21 U.S.C. section 360bbb-3(b)(1), unless the authorization is terminated  or revoked.  Performed at [facility], [address] [place], [place] [address]   Body fluid culture     Status: None (Preliminary result)   Collection Time: 05/02/20  4:44 PM   Specimen: PATH Cytology Pleural fluid  Result Value Ref Range Status   Specimen Description   Final    PLEURAL Performed at [facility], [address], [place], [place] [address]    Special Requests   Final    NONE Performed at [facility], [address] [place], [place] [address]    Gram Stain   Final    MODERATE WBC PRESENT, PREDOMINANTLY MONONUCLEAR NO ORGANISMS SEEN    Culture   Final    NO GROWTH 3 DAYS Performed at [facility], [place] [address] [place], [place] [address]    Report Status PENDING  Incomplete         Radiology Studies: No results found.      Scheduled Meds: . amLODipine  10 mg Oral Daily  . carvedilol  12.5 mg Oral BID WC  . heparin  5,000 Units Subcutaneous Q8H  . hydrALAZINE  25 mg Oral TID  . insulin aspart  0-9 Units Subcutaneous TID WC  . sodium bicarbonate  650 mg Oral TID  . torsemide  40 mg Oral Daily   Continuous Infusions:   LOS: 4  days    Time spent: 25 minutes    Sidney Ace, MD [facility] Pager [phone]  If 7PM-7AM, please contact night-coverage 05/05/2020, 10:52 AM

## 2020-05-05 NOTE — Plan of Care (Signed)

## 2020-05-05 NOTE — Progress Notes (Signed)
Mobility Specialist - Progress Note   05/05/20 1500  Mobility  Range of Motion/Exercises Right leg;Left leg (AP, QS, SLR, ISO, ABD)  Level of Assistance Independent  Assistive Device None  Distance Ambulated (ft) 0 ft  Mobility Response Tolerated well  Mobility performed by Mobility specialist  $Mobility charge 1 Mobility    Pre-mobility: 77 HR, 95% SpO2 Post-mobility: 76 HR, 95% SpO2   Pt was lying in bed upon arrival utilizing room air. Pt agreed to session. Pt denied fatigue and nausea, but did c/o pain in head, neck, and back area "7/10", pt stated that nurse was aware. Pt declined OOB activity this date d/t pain. Pt was able to perform supine exercises: ankle pumps 4x20, quad sets x10, straight leg raises x10, hip isometrics x15, and abduction x10 with no physical assistance or cueing needed. Overall, pt tolerated session well. Pt was left in bed with all needs in reach and alarm set.    Katie [name] Mobility Specialist 05/05/20, 3:30 PM

## 2020-05-05 NOTE — Progress Notes (Signed)
Progress Note  Patient Name: Katie [name] Date of Encounter: 05/05/2020  [facility] Cardiologist: Dr. Fletcher Anon  Subjective   She feels better overall. Less SOB. Still with mild leg edema. Negative 3.7 L for the admission  Inpatient Medications    Scheduled Meds: . amLODipine  10 mg Oral Daily  . carvedilol  12.5 mg Oral BID WC  . heparin  5,000 Units Subcutaneous Q8H  . hydrALAZINE  25 mg Oral TID  . insulin aspart  0-9 Units Subcutaneous TID WC  . sodium bicarbonate  650 mg Oral TID  . torsemide  40 mg Oral Daily   Continuous Infusions:  PRN Meds: acetaminophen **OR** acetaminophen, hydrALAZINE, melatonin, ondansetron **OR** ondansetron (ZOFRAN) IV, oxyCODONE-acetaminophen   Vital Signs    Vitals:   05/04/20 2018 05/05/20 0420 05/05/20 0732 05/05/20 1116  BP: (!) 146/63 (!) 146/66 (!) 178/75 (!) 159/72  Pulse: 79 72 77 79  Resp: 18 17    Temp: 98.6 F (37 C) 98.6 F (37 C) 98 F (36.7 C)   TempSrc: Oral Oral Oral   SpO2: 97% 95% 98% 97%  Weight:  89.9 kg    Height:        Intake/Output Summary (Last 24 hours) at 05/05/2020 1213 Last data filed at 05/05/2020 1000 Gross per 24 hour  Intake 480 ml  Output 1050 ml  Net -570 ml   Last 3 Weights 05/05/2020 05/03/2020 05/01/2020  Weight (lbs) 198 lb 1.6 oz 197 lb 12 oz 201 lb 11.2 oz  Weight (kg) 89.858 kg 89.7 kg 91.491 kg      Telemetry    Sinus rhythm- Personally Reviewed  ECG    No new tracing obtained- Personally Reviewed  Physical Exam   GEN: No acute distress.   Neck: No JVD Cardiac: RRR, 2/6 systolic murmur Respiratory:  Decreased breath sounds at bases bilaterally GI: Soft, nontender, distended  MS:  1+ edema Neuro:  Nonfocal  Psych: Normal affect   Labs    High Sensitivity Troponin:   Recent Labs  Lab 04/30/20 1601 04/30/20 2114  TROPONINIHS 24* 21*      Chemistry Recent Labs  Lab 04/30/20 1601 05/01/20 0519 05/02/20 1100 05/03/20 0408 05/04/20 0604 05/05/20 0551   NA  --    < >  --  139 139 138  K  --    < >  --  4.4 4.4 4.6  CL  --    < >  --  110 109 109  CO2  --    < >  --  21* 22 22  GLUCOSE  --    < >  --  138* 84 85  BUN  --    < >  --  75* 84* 84*  CREATININE  --    < >  --  2.97* 3.15* 3.06*  CALCIUM  --    < >  --  8.0* 7.8* 7.6*  PROT 6.6  --  5.6*  --   --   --   ALBUMIN 3.2*   < >  --  2.5* 2.2* 2.2*  AST 20  --   --   --   --   --   ALT 26  --   --   --   --   --   ALKPHOS 68  --   --   --   --   --   BILITOT 0.9  --   --   --   --   --  GFRNONAA  --    < >  --  17* 16* 17*  ANIONGAP  --    < >  --  8 8 7    < > = values in this interval not displayed.     Hematology Recent Labs  Lab 04/30/20 1158 05/01/20 0519 05/01/20 1802 05/05/20 0715  WBC 9.2 6.8  --  6.3  RBC 3.38* 2.75* 3.03* 3.10*  HGB 10.5* 8.6*  --  9.7*  HCT 34.4* 27.8*  --  30.0*  MCV 101.8* 101.1*  --  96.8  MCH 31.1 31.3  --  31.3  MCHC 30.5 30.9  --  32.3  RDW 14.6 14.4  --  13.6  PLT 197 150  --  137*    BNP Recent Labs  Lab 04/30/20 1601  BNP 769.6*     DDimer No results for input(s): DDIMER in the last 168 hours.   Radiology    No results found.  Cardiac Studies   Echo 04/2020 1. Left ventricular ejection fraction, by estimation, is 55 to 60%. The  left ventricle has normal function. The left ventricle has no regional  wall motion abnormalities. There is severe concentric left ventricular  hypertrophy. Left ventricular diastolic  parameters are consistent with Grade II diastolic dysfunction  (pseudonormalization).  2. Right ventricular systolic function is moderately reduced. The right  ventricular size is normal. Mildly increased right ventricular wall  thickness. There is moderately elevated pulmonary artery systolic  pressure.  3. Left atrial size was mildly dilated.  4. Right atrial size was mildly dilated.  5. Moderate pericardial effusion. The pericardial effusion is  circumferential.  6. The mitral valve is  abnormal. Mild to moderate mitral valve  regurgitation.  7. The tricuspid valve is abnormal. Tricuspid valve regurgitation is mild  to moderate.  8. The aortic valve is abnormal. Aortic valve regurgitation is mild to  moderate. Mild to moderate aortic valve stenosis.  Patient Profile     64 y.o. female history of HFpEF, hypertension, CKD stage IV, presenting with shortness of breath and edema.  Echo showing grade 2 diastolic dysfunction/HFpEF, started on IV Lasix for diuresing.  Assessment & Plan    1.  HFpEF, -Shortness of breath improved still volume overloaded. - stable renal function - continue oral Torsemide.    2.  Hypertension -Continue Coreg, Norvasc, hydralazine. -I agree with increasing Coreg to 12.5 mg bid.   3.  Mild to moderate aortic stenosis -Serial monitoring as outpatient with echo  4.  Pericardial effusion, moderate -Monitor pericardial effusions in the couple of weeks as outpatient  5.  CKD - Stable.  -Nephrology following -Appreciate input        Signed, Kathlyn Sacramento, MD  05/05/2020, 12:13 PM

## 2020-05-05 NOTE — Progress Notes (Signed)
Katie [name], [name] 05/05/20  Subjective:   Hospital day # 4  Doing fair today.  Diagnosed with moderate pericardial effusion, mild to moderate aortic insufficiency and aortic stenosis. Reports multiple stressors in family life Moved to [place] 1 to 2 weeks ago Her main concern is generalized weakness, especially in legs S/p thoracentesis rt lung - 1600 cc fluid removed-May 02, 2020  Renal: 12/22 0701 - 12/23 0700 In: 480 [P.O.:480] Out: 1050 [Urine:1050] Lab Results  Component Value Date   CREATININE 3.06 (H) 05/05/2020   CREATININE 3.15 (H) 05/04/2020   CREATININE 2.97 (H) 05/03/2020     Objective:  Vital signs in last 24 hours:  Temp:  [97.6 F (36.4 C)-98.6 F (37 C)] 98 F (36.7 C) (12/23 0732) Pulse Rate:  [72-79] 79 (12/23 1116) Resp:  [17-18] 17 (12/23 0420) BP: (146-178)/(63-75) 159/72 (12/23 1116) SpO2:  [95 %-98 %] 97 % (12/23 1116) Weight:  [89.9 kg] 89.9 kg (12/23 0420)  Weight change:  Filed Weights   05/01/20 0327 05/03/20 0304 05/05/20 0420  Weight: 91.5 kg 89.7 kg 89.9 kg    Intake/Output:    Intake/Output Summary (Last 24 hours) at 05/05/2020 1415 Last data filed at 05/05/2020 1330 Gross per 24 hour  Intake 480 ml  Output 1050 ml  Net -570 ml     Physical Exam: General:  Chronically ill-appearing, laying in the bed  HEENT  anicteric, moist oral mucous membranes  Pulm/lungs  normal breathing effort  CVS/Heart  regular, no rub  Abdomen:   Soft, nontender  Extremities: + Dependent edema  Neurologic:  Alert, able to answer questions  Skin:  No acute rashes    Basic Metabolic Panel:  Recent Labs  Lab 05/01/20 0519 05/02/20 0507 05/03/20 0408 05/04/20 0604 05/05/20 0551  NA 141 142 139 139 138  K 5.0 4.6 4.4 4.4 4.6  CL 114* 113* 110 109 109  CO2 20* 20* 21* 22 22  GLUCOSE 85 88 138* 84 85  BUN 84* 79* 75* 84* 84*  CREATININE 2.93* 2.87* 2.97* 3.15* 3.06*  CALCIUM 8.1* 8.3* 8.0* 7.8*  7.6*  PHOS  --  5.6* 5.4* 5.6* 5.4*     CBC: Recent Labs  Lab 04/30/20 1158 05/01/20 0519 05/05/20 0715  WBC 9.2 6.8 6.3  HGB 10.5* 8.6* 9.7*  HCT 34.4* 27.8* 30.0*  MCV 101.8* 101.1* 96.8  PLT 197 150 137*     No results found for: HEPBSAG, HEPBSAB, HEPBIGM    Microbiology:  Recent Results (from the past 240 hour(s))  Resp Panel by RT-PCR (Flu A&B, Covid) Nasopharyngeal Swab     Status: None   Collection Time: 04/30/20  4:01 PM   Specimen: Nasopharyngeal Swab; Nasopharyngeal(NP) swabs in vial transport medium  Result Value Ref Range Status   SARS Coronavirus 2 by RT PCR NEGATIVE NEGATIVE Final    Comment: (NOTE) SARS-CoV-2 target nucleic acids are NOT DETECTED.  The SARS-CoV-2 RNA is generally detectable in upper respiratory specimens during the acute phase of infection. The lowest concentration of SARS-CoV-2 viral copies this assay can detect is 138 copies/mL. A negative result does not preclude SARS-Cov-2 infection and should not be used as the sole basis for treatment or other patient management decisions. A negative result may occur with  improper specimen collection/handling, submission of specimen other than nasopharyngeal swab, presence of viral mutation(s) within the areas targeted by this assay, and inadequate number of viral copies(<138 copies/mL). A negative result must be combined with clinical observations, patient  history, and epidemiological information. The expected result is Negative.  Fact Sheet for Patients:  EntrepreneurPulse.com.au  Fact Sheet for Healthcare Providers:  IncredibleEmployment.be  This test is no t yet approved or cleared by the [place] FDA and  has been authorized for detection and/or diagnosis of SARS-CoV-2 by FDA under an Emergency Use Authorization (EUA). This EUA will remain  in effect (meaning this test can be used) for the duration of the COVID-19 declaration under Section  564(b)(1) of the Act, 21 U.S.C.section 360bbb-3(b)(1), unless the authorization is terminated  or revoked sooner.       Influenza A by PCR NEGATIVE NEGATIVE Final   Influenza B by PCR NEGATIVE NEGATIVE Final    Comment: (NOTE) The Xpert Xpress SARS-CoV-2/FLU/RSV plus assay is intended as an aid in the diagnosis of influenza from Nasopharyngeal swab specimens and should not be used as a sole basis for treatment. Nasal washings and aspirates are unacceptable for Xpert Xpress SARS-CoV-2/FLU/RSV testing.  Fact Sheet for Patients: EntrepreneurPulse.com.au  Fact Sheet for Healthcare Providers: IncredibleEmployment.be  This test is not yet approved or cleared by the [place] FDA and has been authorized for detection and/or diagnosis of SARS-CoV-2 by FDA under an Emergency Use Authorization (EUA). This EUA will remain in effect (meaning this test can be used) for the duration of the COVID-19 declaration under Section 564(b)(1) of the Act, 21 U.S.C. section 360bbb-3(b)(1), unless the authorization is terminated or revoked.  Performed at [facility], [address] [place], [place] [address]   Body fluid culture     Status: None (Preliminary result)   Collection Time: 05/02/20  4:44 PM   Specimen: PATH Cytology Pleural fluid  Result Value Ref Range Status   Specimen Description   Final    PLEURAL Performed at [facility], [address], [place], [place] [address]    Special Requests   Final    NONE Performed at [facility], [address] [place], [place] [address]    Gram Stain   Final    MODERATE WBC PRESENT, PREDOMINANTLY MONONUCLEAR NO ORGANISMS SEEN    Culture   Final    NO GROWTH 3 DAYS Performed at [facility], [place] [address] [place], [place] [address]    Report Status PENDING  Incomplete    Coagulation Studies: No results for input(s): LABPROT, INR in the last 72  hours.  Urinalysis: Recent Labs    05/03/20 1158  COLORURINE YELLOW*  LABSPEC 1.011  PHURINE 5.0  GLUCOSEU 50*  HGBUR SMALL*  BILIRUBINUR NEGATIVE  KETONESUR NEGATIVE  PROTEINUR >=300*  NITRITE NEGATIVE  LEUKOCYTESUR NEGATIVE      Imaging: No results found.   Medications:    . amLODipine  10 mg Oral Daily  . carvedilol  12.5 mg Oral BID WC  . heparin  5,000 Units Subcutaneous Q8H  . hydrALAZINE  25 mg Oral TID  . insulin aspart  0-9 Units Subcutaneous TID WC  . sodium bicarbonate  650 mg Oral TID  . torsemide  40 mg Oral Daily   acetaminophen **OR** acetaminophen, hydrALAZINE, melatonin, ondansetron **OR** ondansetron (ZOFRAN) IV, oxyCODONE-acetaminophen  Assessment/ Plan:  64 y.o. female with heart failure with preserved EF, hypertension, CKD, stroke   admitted on 04/30/2020 for Chronic kidney disease (CKD), stage IV (severe) (HCC) [N18.4] Volume overload [E87.70]  # chronic kidney disease stage IV   Current creatinine of 3.06, GFR 17 Awaiting records from previous nephrology evaluation in [place].   Renal ultrasound shows  echogenic kidneys Hemoglobin A1c 4.7% May 01, 2020 Underlying CKD likely secondary to hypertension and atherosclerosis  #Proteinuria Urine protein to creatinine ratio 5.35 g May 03, 2020 Serological studies ordered.  Results pending ANA negative  #Severe hypertension, lower extremity edema 2D echo with LVEF 55 to 60%, moderately elevated pulmonary artery systolic pressures, moderate pericardial effusion, Mild to moderate aortic stenosis  currently managed with IV furosemide 80 mg twice daily Changed to oral torsemide in anticipation for discharge   LOS: [place] 12/23/20212:15 PM  [place], [place]  Note: This note was prepared with Dragon dictation. Any transcription errors are unintentional

## 2020-05-06 DIAGNOSIS — N184 Chronic kidney disease, stage 4 (severe): Secondary | ICD-10-CM

## 2020-05-06 LAB — RENAL FUNCTION PANEL
Albumin: 2.3 g/dL — ABNORMAL LOW (ref 3.5–5.0)
Anion gap: 8 (ref 5–15)
BUN: 85 mg/dL — ABNORMAL HIGH (ref 8–23)
CO2: 22 mmol/L (ref 22–32)
Calcium: 7.4 mg/dL — ABNORMAL LOW (ref 8.9–10.3)
Chloride: 105 mmol/L (ref 98–111)
Creatinine, Ser: 3.13 mg/dL — ABNORMAL HIGH (ref 0.44–1.00)
GFR, Estimated: 16 mL/min — ABNORMAL LOW (ref >60)
Glucose, Bld: 107 mg/dL — ABNORMAL HIGH (ref 70–99)
Phosphorus: 5.2 mg/dL — ABNORMAL HIGH (ref 2.5–4.6)
Potassium: 4.2 mmol/L (ref 3.5–5.1)
Sodium: 135 mmol/L (ref 135–145)

## 2020-05-06 LAB — BODY FLUID CULTURE: Culture: NO GROWTH

## 2020-05-06 LAB — MAGNESIUM: Magnesium: 1.9 mg/dL (ref 1.7–2.4)

## 2020-05-06 LAB — GLUCOSE, CAPILLARY
Glucose-Capillary: 82 mg/dL (ref 70–99)
Glucose-Capillary: 123 mg/dL — ABNORMAL HIGH (ref 70–99)
Glucose-Capillary: 124 mg/dL — ABNORMAL HIGH (ref 70–99)
Glucose-Capillary: 118 mg/dL — ABNORMAL HIGH (ref 70–99)

## 2020-05-06 MED ORDER — HYDRALAZINE HCL 50 MG PO TABS
50.0000 mg | ORAL_TABLET | Freq: Three times a day (TID) | ORAL | Status: DC
  Administered 2020-05-06 – 2020-05-08 (×7): 50 mg via ORAL
  Filled 2020-05-06 (×7): qty 1

## 2020-05-06 MED ORDER — GUAIFENESIN-DM 100-10 MG/5ML PO SYRP
5.0000 mL | ORAL_SOLUTION | ORAL | Status: DC | PRN
  Administered 2020-05-06 – 2020-05-15 (×32): 5 mL via ORAL
  Filled 2020-05-06 (×33): qty 5

## 2020-05-06 NOTE — Progress Notes (Signed)
Progress Note  Patient Name: Katie [name] Date of Encounter: 05/06/2020  [facility] Cardiologist: Dr. Fletcher Anon  Subjective   Occasional right sided discomfort/site of thoracentesis with deep breaths.  Edema is about the same, appears to be diuresing well.  Inpatient Medications    Scheduled Meds: . amLODipine  10 mg Oral Daily  . carvedilol  12.5 mg Oral BID WC  . heparin  5,000 Units Subcutaneous Q8H  . hydrALAZINE  50 mg Oral TID  . insulin aspart  0-9 Units Subcutaneous TID WC  . sodium bicarbonate  650 mg Oral TID  . torsemide  40 mg Oral Daily   Continuous Infusions:  PRN Meds: acetaminophen **OR** acetaminophen, guaiFENesin-dextromethorphan, hydrALAZINE, melatonin, ondansetron **OR** ondansetron (ZOFRAN) IV, oxyCODONE-acetaminophen   Vital Signs    Vitals:   05/05/20 1116 05/05/20 2018 05/06/20 0359 05/06/20 0819  BP: (!) 159/72 (!) 151/70 (!) 154/69 (!) 177/79  Pulse: 79 74 78 73  Resp:  17 17 19   Temp:  98.9 F (37.2 C) 97.9 F (36.6 C) 98.4 F (36.9 C)  TempSrc:  Oral Oral Oral  SpO2: 97% 97% 97% 97%  Weight:   90.2 kg   Height:        Intake/Output Summary (Last 24 hours) at 05/06/2020 1102 Last data filed at 05/06/2020 0948 Gross per 24 hour  Intake 480 ml  Output 2150 ml  Net -1670 ml   Last 3 Weights 05/06/2020 05/05/2020 05/03/2020  Weight (lbs) 198 lb 14.4 oz 198 lb 1.6 oz 197 lb 12 oz  Weight (kg) 90.22 kg 89.858 kg 89.7 kg      Telemetry    Sinus rhythm- Personally Reviewed  ECG    No new tracing obtained- Personally Reviewed  Physical Exam   GEN: No acute distress.   Neck: No JVD Cardiac: RRR, 2/6 systolic murmur Respiratory:  Decreased breath sounds at bases bilaterally GI: Soft, nontender, distended  MS:  2+ edema Neuro:  Nonfocal  Psych: Normal affect   Labs    High Sensitivity Troponin:   Recent Labs  Lab 04/30/20 1601 04/30/20 2114  TROPONINIHS 24* 21*      Chemistry Recent Labs  Lab 04/30/20 1601  05/01/20 0519 05/02/20 1100 05/03/20 0408 05/04/20 0604 05/05/20 0551 05/06/20 0556  NA  --    < >  --    < > 139 138 135  K  --    < >  --    < > 4.4 4.6 4.2  CL  --    < >  --    < > 109 109 105  CO2  --    < >  --    < > 22 22 22   GLUCOSE  --    < >  --    < > 84 85 107*  BUN  --    < >  --    < > 84* 84* 85*  CREATININE  --    < >  --    < > 3.15* 3.06* 3.13*  CALCIUM  --    < >  --    < > 7.8* 7.6* 7.4*  PROT 6.6  --  5.6*  --   --   --   --   ALBUMIN 3.2*   < >  --    < > 2.2* 2.2* 2.3*  AST 20  --   --   --   --   --   --   ALT 26  --   --   --   --   --   --  ALKPHOS 68  --   --   --   --   --   --   BILITOT 0.9  --   --   --   --   --   --   GFRNONAA  --    < >  --    < > 16* 17* 16*  ANIONGAP  --    < >  --    < > 8 7 8    < > = values in this interval not displayed.     Hematology Recent Labs  Lab 04/30/20 1158 05/01/20 0519 05/01/20 1802 05/05/20 0715  WBC 9.2 6.8  --  6.3  RBC 3.38* 2.75* 3.03* 3.10*  HGB 10.5* 8.6*  --  9.7*  HCT 34.4* 27.8*  --  30.0*  MCV 101.8* 101.1*  --  96.8  MCH 31.1 31.3  --  31.3  MCHC 30.5 30.9  --  32.3  RDW 14.6 14.4  --  13.6  PLT 197 150  --  137*    BNP Recent Labs  Lab 04/30/20 1601  BNP 769.6*     DDimer No results for input(s): DDIMER in the last 168 hours.   Radiology    No results found.  Cardiac Studies   Echo 04/2020 1. Left ventricular ejection fraction, by estimation, is 55 to 60%. The  left ventricle has normal function. The left ventricle has no regional  wall motion abnormalities. There is severe concentric left ventricular  hypertrophy. Left ventricular diastolic  parameters are consistent with Grade II diastolic dysfunction  (pseudonormalization).  2. Right ventricular systolic function is moderately reduced. The right  ventricular size is normal. Mildly increased right ventricular wall  thickness. There is moderately elevated pulmonary artery systolic  pressure.  3. Left atrial size  was mildly dilated.  4. Right atrial size was mildly dilated.  5. Moderate pericardial effusion. The pericardial effusion is  circumferential.  6. The mitral valve is abnormal. Mild to moderate mitral valve  regurgitation.  7. The tricuspid valve is abnormal. Tricuspid valve regurgitation is mild  to moderate.  8. The aortic valve is abnormal. Aortic valve regurgitation is mild to  moderate. Mild to moderate aortic valve stenosis.  Patient Profile     64 y.o. female history of HFpEF, hypertension, CKD stage IV, presenting with shortness of breath and edema.  Echo showing grade 2 diastolic dysfunction/HFpEF, started on IV Lasix for diuresing.  Assessment & Plan    1.  HFpEF, -Still volume overloaded, net -1.1 L over the past 24 hours -Creatinine stable -Continue torsemide 40 mg daily -Monitor creatinine  2.  Hypertension -BP elevated  -Increase hydralazine to 50 mg 3 times daily -Continue Coreg, Norvasc -Uptitrate hydralazine/Coreg to control BP.  3.  Mild to moderate aortic stenosis -Serial monitoring as outpatient with echo  4.  Pericardial effusion, moderate -Monitor for resolution with serial echo as outpatient  5.  CKD -P.o. torsemide -Nephrology following    Total encounter time 35 minutes  Greater than 50% was spent in counseling and coordination of care with the patient     Signed, Kate Sable, MD  05/06/2020, 11:02 AM

## 2020-05-06 NOTE — Plan of Care (Signed)

## 2020-05-06 NOTE — Progress Notes (Signed)
PROGRESS NOTE    Katie [name]  ZOX:[id number] DOB: Oct 29, 1955 DOA: 04/30/2020 PCP: Patient, [name]   Brief Narrative:   Katie [name] is a 64 y.o. female with a history of diabetes mellitus type 2, stage IV CKD, heart failure, hypertension. Patient presented secondary to dyspnea with evidence of volume overload and pleural effusion.  12/22: Patient still edematous.  Net -3 L since admission.  Creatinine worsening.  Seen by cardiology nephrology this morning.  12/23: Patient remains fluid overloaded with 2+ pitting edema bilateral lower extremities.  Concern regarding worsening renal function is limiting effective diuresis.  Patient net -3.7 L since admission.  12/24: Patient seen and examined.  Lower extremities remain edematous.  Patient net -5.4 L since admission.  Kidney function worsening.    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)   Hyperkalemia   Pressure injury of skin   Pleural effusion   Heart failure with preserved ejection fraction (HCC)   Chronic kidney disease (CKD), stage IV (severe) (HCC)  Acute on chronic decompensated diastolic heart failure In setting of uncontrolled hypertension Remains volume overloaded IV diuresis changed to p.o. Demadex due to worsening kidney function Plan: Continue diuretics per nephrology and cardiology recommendations Monitor kidney function carefully Monitor and replace electrolytes Continue Coreg and hydralazine No ACE inhibitor due to AKI Strict I's and O's Daily weights  Pericardial effusion Moderate size on echo No indication of tamponade Repeat limited echo in a few weeks  Pleural effusion Status post ultrasound-guided thoracentesis 05/02/2020 Suspect transudate of process secondary to decompensated heart failure Notification of hemorrhagic  or infected effusion  Diabetes mellitus, type 2 Hemoglobin A1C of 4.7%. No need for pharmacologic treatment. Diet control.  Acute kidney injury on chronic kidney disease stage IV Unknown baseline.  Patient is unsure of her primary nephrologist in [place].  Creatinine of 2.97 on admission.  Per patient, she was planned to start hemodialysis in addition to consideration for renal transplant.  Nephrology consulted on 12/20 12/24: Kidney function worsening Plan: Torsemide per nephrology Avoid nonessential nephrotoxins Daily renal function  Mild/moderate aortic stenosis Unsure if this is symptomatic.  Patient does have dyspnea with exertion but this may be multifactorial. -Cardiology recommendations: outpatient Transthoracic Echocardiogram   Essential hypertension Patient is on at least hydrochlorothiazide as an outpatient but is unsure of her regimen. Currently uncontrolled. -Amlodipine 10 mg daily; continue diuresis -Cardiology added Coreg.  Dose increased to 12.5 mg twice daily -Continue hydralazine p.o..  Dose increased to 50 mg 3 times daily -Continue hydralazine 10 mg q6 hours prn -Uptitrate antihypertensive regimen as necessary  Anemia Possible related to underlying kidney disease.  Evidence of macrocytosis on CBC.  TIBC low but otherwise normal iron panel.  Vitamin B12 and folate normal.  Hyperkalemia In setting of CKD.  Lokelma 10 g given with improvement.  Pressure injury Mid coccyx, POA  History of tobacco use Patient has quit for at least 10 years.  Previously, 20 pack year history.  Headache Normal chronic headache. Patient states she takes Imitrex -Imitrex  Obesity Body mass index is 31.92 kg/m.   DVT prophylaxis: SQ heparin Code Status: Full Family Communication: Left VM for friend Howard Pouch [phone] on 05/05/2020 disposition Plan: Status is: Inpatient  Remains inpatient appropriate because:Inpatient level of care appropriate  due to severity of illness  Dispo: The patient is from: Home              Anticipated d/c is to: Unknown at this time              Anticipated d/c date is: 2 days              Patient currently is not medically stable to d/c.   Not medically stable for discharge at this time.  Worsening kidney function.  Persistent evidence of fluid overload.  Diuresing effectively.  Net -5.4 L.  Anticipate medical readiness within 24 to 48 hours.   Consultants:   Cardiology-[facility]  Nephrology-[facility]  Procedures:   Ultrasound-guided thoracentesis, 05/02/2020  Antimicrobials:   None   Subjective: Patient seen and examined.  Room air.  Continues to endorse swelling in the feet.  Objective: Vitals:   05/05/20 2018 05/06/20 0359 05/06/20 0819 05/06/20 1151  BP: (!) 151/70 (!) 154/69 (!) 177/79 (!) 145/64  Pulse: 74 78 73 72  Resp: 17 17 19 18   Temp: 98.9 F (37.2 C) 97.9 F (36.6 C) 98.4 F (36.9 C) 97.9 F (36.6 C)  TempSrc: Oral Oral Oral Oral  SpO2: 97% 97% 97% 96%  Weight:  90.2 kg    Height:        Intake/Output Summary (Last 24 hours) at 05/06/2020 1344 Last data filed at 05/06/2020 1150 Gross per 24 hour  Intake 240 ml  Output 1400 ml  Net -1160 ml   Filed Weights   05/03/20 0304 05/05/20 0420 05/06/20 0359  Weight: 89.7 kg 89.9 kg 90.2 kg    Examination:  General exam: Appears calm and comfortable  Respiratory system: Bibasilar crackles.  Normal work of breathing.  Room air Cardiovascular system: S1-S2 heard, regular rate and rhythm, heart sounds distant, 2+ pitting edema bilaterally gastrointestinal system: Abdomen is nondistended, soft and nontender. No organomegaly or masses felt. Normal bowel sounds heard. Central nervous system: Alert and oriented. No focal neurological deficits. Extremities: Symmetric 5 x 5 power. Skin: No rashes, lesions or ulcers Psychiatry: Judgement and insight appear normal. Mood & affect appropriate.     Data  Reviewed: I have personally reviewed following labs and imaging studies  CBC: Recent Labs  Lab 04/30/20 1158 05/01/20 0519 05/05/20 0715  WBC 9.2 6.8 6.3  HGB 10.5* 8.6* 9.7*  HCT 34.4* 27.8* 30.0*  MCV 101.8* 101.1* 96.8  PLT 197 150 979*   Basic Metabolic Panel: Recent Labs  Lab 05/02/20 0507 05/03/20 0408 05/04/20 0604 05/05/20 0551 05/06/20 0556  NA 142 139 139 138 135  K 4.6 4.4 4.4 4.6 4.2  CL 113* 110 109 109 105  CO2 20* 21* 22 22 22   GLUCOSE 88 138* 84 85 107*  BUN 79* 75* 84* 84* 85*  CREATININE 2.87* 2.97* 3.15* 3.06* 3.13*  CALCIUM 8.3* 8.0* 7.8* 7.6* 7.4*  MG  --   --   --   --  1.9  PHOS 5.6* 5.4* 5.6* 5.4* 5.2*   GFR: Estimated Creatinine Clearance: 21.4 mL/min (A) (by C-G formula based on SCr of 3.13 mg/dL (H)). Liver Function Tests: Recent Labs  Lab 04/30/20 1601 05/02/20 0507 05/02/20 1100 05/03/20 0408 05/04/20 0604 05/05/20 0551 05/06/20 0556  AST 20  --   --   --   --   --   --   ALT 26  --   --   --   --   --   --   ALKPHOS 68  --   --   --   --   --   --  BILITOT 0.9  --   --   --   --   --   --   PROT 6.6  --  5.6*  --   --   --   --   ALBUMIN 3.2* 2.6*  --  2.5* 2.2* 2.2* 2.3*   No results for input(s): LIPASE, AMYLASE in the last 168 hours. No results for input(s): AMMONIA in the last 168 hours. Coagulation Profile: No results for input(s): INR, PROTIME in the last 168 hours. Cardiac Enzymes: No results for input(s): CKTOTAL, CKMB, CKMBINDEX, TROPONINI in the last 168 hours. BNP (last 3 results) No results for input(s): PROBNP in the last 8760 hours. HbA1C: No results for input(s): HGBA1C in the last 72 hours. CBG: Recent Labs  Lab 05/05/20 1141 05/05/20 1735 05/05/20 2023 05/06/20 0801 05/06/20 1149  GLUCAP 127* 142* 127* 82 123*   Lipid Profile: No results for input(s): CHOL, HDL, LDLCALC, TRIG, CHOLHDL, LDLDIRECT in the last 72 hours. Thyroid Function Tests: No results for input(s): TSH, T4TOTAL, FREET4, T3FREE,  THYROIDAB in the last 72 hours. Anemia Panel: No results for input(s): VITAMINB12, FOLATE, FERRITIN, TIBC, IRON, RETICCTPCT in the last 72 hours. Sepsis Labs: No results for input(s): PROCALCITON, LATICACIDVEN in the last 168 hours.  Recent Results (from the past 240 hour(s))  Resp Panel by RT-PCR (Flu A&B, Covid) Nasopharyngeal Swab     Status: None   Collection Time: 04/30/20  4:01 PM   Specimen: Nasopharyngeal Swab; Nasopharyngeal(NP) swabs in vial transport medium  Result Value Ref Range Status   SARS Coronavirus 2 by RT PCR NEGATIVE NEGATIVE Final    Comment: (NOTE) SARS-CoV-2 target nucleic acids are NOT DETECTED.  The SARS-CoV-2 RNA is generally detectable in upper respiratory specimens during the acute phase of infection. The lowest concentration of SARS-CoV-2 viral copies this assay can detect is 138 copies/mL. A negative result does not preclude SARS-Cov-2 infection and should not be used as the sole basis for treatment or other patient management decisions. A negative result may occur with  improper specimen collection/handling, submission of specimen other than nasopharyngeal swab, presence of viral mutation(s) within the areas targeted by this assay, and inadequate number of viral copies(<138 copies/mL). A negative result must be combined with clinical observations, patient history, and epidemiological information. The expected result is Negative.  Fact Sheet for Patients:  EntrepreneurPulse.com.au  Fact Sheet for Healthcare Providers:  IncredibleEmployment.be  This test is no t yet approved or cleared by the [place] FDA and  has been authorized for detection and/or diagnosis of SARS-CoV-2 by FDA under an Emergency Use Authorization (EUA). This EUA will remain  in effect (meaning this test can be used) for the duration of the COVID-19 declaration under Section 564(b)(1) of the Act, 21 U.S.C.section 360bbb-3(b)(1), unless the  authorization is terminated  or revoked sooner.       Influenza A by PCR NEGATIVE NEGATIVE Final   Influenza B by PCR NEGATIVE NEGATIVE Final    Comment: (NOTE) The Xpert Xpress SARS-CoV-2/FLU/RSV plus assay is intended as an aid in the diagnosis of influenza from Nasopharyngeal swab specimens and should not be used as a sole basis for treatment. Nasal washings and aspirates are unacceptable for Xpert Xpress SARS-CoV-2/FLU/RSV testing.  Fact Sheet for Patients: EntrepreneurPulse.com.au  Fact Sheet for Healthcare Providers: IncredibleEmployment.be  This test is not yet approved or cleared by the [place] FDA and has been authorized for detection and/or diagnosis of SARS-CoV-2 by FDA under an Emergency Use Authorization (EUA). This EUA  will remain in effect (meaning this test can be used) for the duration of the COVID-19 declaration under Section 564(b)(1) of the Act, 21 U.S.C. section 360bbb-3(b)(1), unless the authorization is terminated or revoked.  Performed at [facility], [address] [place], [place] [address]   Body fluid culture     Status: None   Collection Time: 05/02/20  4:44 PM   Specimen: PATH Cytology Pleural fluid  Result Value Ref Range Status   Specimen Description   Final    PLEURAL Performed at [facility], [address] [place], [place] [address]    Special Requests   Final    NONE Performed at [facility], [address] [place], [place] [address]    Gram Stain   Final    MODERATE WBC PRESENT, PREDOMINANTLY MONONUCLEAR NO ORGANISMS SEEN    Culture   Final    NO GROWTH Performed at [facility], [place] [address] [place], [place] [address]    Report Status 05/06/2020 FINAL  Final         Radiology Studies: No results found.      Scheduled Meds: . amLODipine  10 mg Oral Daily  . carvedilol  12.5 mg Oral BID WC  . heparin  5,000 Units Subcutaneous Q8H   . hydrALAZINE  50 mg Oral TID  . insulin aspart  0-9 Units Subcutaneous TID WC  . sodium bicarbonate  650 mg Oral TID  . torsemide  40 mg Oral Daily   Continuous Infusions:   LOS: 5 days    Time spent: 15 minutes    Sidney Ace, MD [facility] Pager [phone]  If 7PM-7AM, please contact night-coverage 05/06/2020, 1:44 PM

## 2020-05-06 NOTE — Progress Notes (Signed)
PT Cancellation Note  Patient Details Name: Katie [name] MRN: [id number] DOB: July 19, 1955   Cancelled Treatment:     Pt with possible increased fluid retention and currently experiencing SOB, PT session held for today.   Josie Dixon 05/06/2020, 4:46 PM

## 2020-05-07 LAB — RENAL FUNCTION PANEL
Albumin: 2.2 g/dL — ABNORMAL LOW (ref 3.5–5.0)
Anion gap: 8 (ref 5–15)
BUN: 90 mg/dL — ABNORMAL HIGH (ref 8–23)
CO2: 22 mmol/L (ref 22–32)
Calcium: 7.8 mg/dL — ABNORMAL LOW (ref 8.9–10.3)
Chloride: 109 mmol/L (ref 98–111)
Creatinine, Ser: 3.32 mg/dL — ABNORMAL HIGH (ref 0.44–1.00)
GFR, Estimated: 15 mL/min — ABNORMAL LOW (ref >60)
Glucose, Bld: 91 mg/dL (ref 70–99)
Phosphorus: 6.2 mg/dL — ABNORMAL HIGH (ref 2.5–4.6)
Potassium: 4.8 mmol/L (ref 3.5–5.1)
Sodium: 139 mmol/L (ref 135–145)

## 2020-05-07 LAB — GLUCOSE, CAPILLARY
Glucose-Capillary: 85 mg/dL (ref 70–99)
Glucose-Capillary: 97 mg/dL (ref 70–99)
Glucose-Capillary: 130 mg/dL — ABNORMAL HIGH (ref 70–99)
Glucose-Capillary: 157 mg/dL — ABNORMAL HIGH (ref 70–99)

## 2020-05-07 LAB — MAGNESIUM: Magnesium: 1.9 mg/dL (ref 1.7–2.4)

## 2020-05-07 NOTE — Progress Notes (Signed)
Progress Note  Patient Name: Katie [name] Date of Encounter: 05/07/2020  [facility] Cardiologist:  Fletcher Anon   Subjective   64 year old female, hx of COPD, HTN  who is admitted with CKD and volume overload. She continues to be aggressively diuresed.  She is net -3.3 L so far.  Creatinine continues to slowly increase. Echocardiogram from December 19 reveals well-preserved left ventricular systolic function with an ejection fraction of 55 to 60%.  There is concentric LVH.  There is grade 2 diastolic dysfunction.  The right ventricular systolic function is moderately reduced.  There is moderately elevated pulmonary pressures with an estimated PA pressure of 51 mmHg.   Inpatient Medications    Scheduled Meds: . amLODipine  10 mg Oral Daily  . carvedilol  12.5 mg Oral BID WC  . heparin  5,000 Units Subcutaneous Q8H  . hydrALAZINE  50 mg Oral TID  . insulin aspart  0-9 Units Subcutaneous TID WC  . sodium bicarbonate  650 mg Oral TID   Continuous Infusions:  PRN Meds: acetaminophen **OR** acetaminophen, guaiFENesin-dextromethorphan, hydrALAZINE, melatonin, ondansetron **OR** ondansetron (ZOFRAN) IV, oxyCODONE-acetaminophen   Vital Signs    Vitals:   05/06/20 2058 05/07/20 0345 05/07/20 0750 05/07/20 1135  BP: 132/62 129/65 (!) 152/68 (!) 121/58  Pulse: 74 71 70 68  Resp: 17 18 17 18   Temp: 98.3 F (36.8 C) 98.1 F (36.7 C) 98.6 F (37 C) 98.7 F (37.1 C)  TempSrc:  Oral Oral Oral  SpO2: 99% 99% 97% 97%  Weight:  90.7 kg    Height:        Intake/Output Summary (Last 24 hours) at 05/07/2020 1259 Last data filed at 05/07/2020 1012 Gross per 24 hour  Intake 1200 ml  Output 500 ml  Net 700 ml   Last 3 Weights 05/07/2020 05/06/2020 05/05/2020  Weight (lbs) 200 lb 198 lb 14.4 oz 198 lb 1.6 oz  Weight (kg) 90.719 kg 90.22 kg 89.858 kg      Telemetry    Sinus rhythm at 51  - Personally Reviewed  ECG     - Personally Reviewed  Physical Exam  \ GEN:  middle age  female,  Appears older than stated age.   Neck: No JVD Cardiac: RRR,  0-3/0 systolic murmur  Respiratory: decreased breath sounds bilaterlaly . GI: Soft, nontender, non-distended  MS: No edema; No deformity. Neuro:  Nonfocal  Psych: Normal affect   Labs    High Sensitivity Troponin:   Recent Labs  Lab 04/30/20 1601 04/30/20 2114  TROPONINIHS 24* 21*      Chemistry Recent Labs  Lab 04/30/20 1601 05/01/20 0519 05/02/20 1100 05/03/20 0408 05/05/20 0551 05/06/20 0556 05/07/20 0553  NA  --    < >  --    < > 138 135 139  K  --    < >  --    < > 4.6 4.2 4.8  CL  --    < >  --    < > 109 105 109  CO2  --    < >  --    < > 22 22 22   GLUCOSE  --    < >  --    < > 85 107* 91  BUN  --    < >  --    < > 84* 85* 90*  CREATININE  --    < >  --    < > 3.06* 3.13* 3.32*  CALCIUM  --    < >  --    < >  7.6* 7.4* 7.8*  PROT 6.6  --  5.6*  --   --   --   --   ALBUMIN 3.2*   < >  --    < > 2.2* 2.3* 2.2*  AST 20  --   --   --   --   --   --   ALT 26  --   --   --   --   --   --   ALKPHOS 68  --   --   --   --   --   --   BILITOT 0.9  --   --   --   --   --   --   GFRNONAA  --    < >  --    < > 17* 16* 15*  ANIONGAP  --    < >  --    < > 7 8 8    < > = values in this interval not displayed.     Hematology Recent Labs  Lab 05/01/20 0519 05/01/20 1802 05/05/20 0715  WBC 6.8  --  6.3  RBC 2.75* 3.03* 3.10*  HGB 8.6*  --  9.7*  HCT 27.8*  --  30.0*  MCV 101.1*  --  96.8  MCH 31.3  --  31.3  MCHC 30.9  --  32.3  RDW 14.4  --  13.6  PLT 150  --  137*    BNP Recent Labs  Lab 04/30/20 1601  BNP 769.6*     DDimer No results for input(s): DDIMER in the last 168 hours.   Radiology    No results found.  Cardiac Studies      Patient Profile     64 y.o. female admited with acute kidney injury and diastolic dysfunction Has moderte pulmonary HTN  Assessment & Plan    . 1.  1.  Acute on chronic diastolic congestive heart failure: Continue diuresis.  She is now on  torsemide.  Creatinine continues to slowly increase.  Further plans per nephrology and the internal medicine team.  2.  Hypertension: Continue current medications.  3.  Aortic stenosis, mild to moderate  4. Pleural effusions:  Repeat CXR         For questions or updates, please contact [place] Please consult [email or web address] for contact info under        Signed, Mertie Moores, MD  05/07/2020, 12:59 PM

## 2020-05-07 NOTE — Plan of Care (Signed)
  Problem: Education: Goal: Knowledge of General Education information will improve Description: Including pain rating scale, medication(s)/side effects and non-pharmacologic comfort measures Outcome: Progressing   Problem: Clinical Measurements: Goal: Will remain free from infection Outcome: Progressing Goal: Respiratory complications will improve Outcome: Progressing   Problem: Coping: Goal: Level of anxiety will decrease Outcome: Progressing   Problem: Elimination: Goal: Will not experience complications related to urinary retention Outcome: Progressing   Problem: Safety: Goal: Ability to remain free from injury will improve Outcome: Progressing

## 2020-05-07 NOTE — Progress Notes (Signed)
PROGRESS NOTE    Katie [name]  PTW:[id number] DOB: 1956-02-15 DOA: 04/30/2020 PCP: Patient, [name]   Brief Narrative:   Katie [name] is a 64 y.o. female with a history of diabetes mellitus type 2, stage IV CKD, heart failure, hypertension. Patient presented secondary to dyspnea with evidence of volume overload and pleural effusion.  12/22: Patient still edematous.  Net -3 L since admission.  Creatinine worsening.  Seen by cardiology nephrology this morning.  12/23: Patient remains fluid overloaded with 2+ pitting edema bilateral lower extremities.  Concern regarding worsening renal function is limiting effective diuresis.  Patient net -3.7 L since admission.  12/24: Patient seen and examined.  Lower extremities remain edematous.  Patient net -5.4 L since admission.  Kidney function worsening.  12/25: Lower extremity edema improved.  Continues to diurese well.  Kidney function worsening.    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 Demadex was initiated with few days ago due to worsening kidney function Kidney function continues to worsen Plan: Hold diuretics for now Monitor kidney function carefully Monitor and replace electrolytes Continue Coreg and hydralazine No ACE inhibitor due to AKI Strict I's and O's Daily weights  Pericardial effusion Moderate size on echo No indication of tamponade Repeat limited echo in a few weeks  Pleural effusion Status post ultrasound-guided thoracentesis  05/02/2020 Suspect transudate of process secondary to decompensated heart failure Notification of hemorrhagic or infected effusion  Diabetes mellitus, type 2 Hemoglobin A1C of 4.7%. No need for pharmacologic treatment. Diet control.  Acute kidney injury on chronic kidney disease stage IV Unknown baseline.  Patient is unsure of her primary nephrologist in [place].  Creatinine of 2.97 on admission.  Per patient, she was planned to start hemodialysis in addition to consideration for renal transplant.  Nephrology consulted on 12/20 12/25: Kidney function worsening Plan: Torsemide on hold Avoid nonessential nephrotoxins Daily renal function Possible 24-hour urine collection.  Defer to nephrology  Mild/moderate aortic stenosis Unsure if this is symptomatic.  Patient does have dyspnea with exertion but this may be multifactorial. -Cardiology recommendations: outpatient Transthoracic Echocardiogram   Essential hypertension Patient is on at least hydrochlorothiazide as an outpatient but is unsure of her regimen. Currently uncontrolled. -Amlodipine 10 mg daily; continue diuresis -Cardiology added Coreg.  Dose increased to 12.5 mg twice daily -Continue hydralazine p.o..  Dose increased to 50 mg 3 times daily -Continue hydralazine 10 mg q6 hours prn -Uptitrate antihypertensive regimen as necessary  Anemia Possible related to underlying kidney disease.  Evidence of macrocytosis on CBC.  TIBC low but otherwise normal iron panel.  Vitamin B12 and folate normal.  Hyperkalemia In setting of CKD.  Lokelma 10 g given with improvement.  Pressure injury Mid coccyx, POA  History of tobacco use Patient has quit for at least 10 years.  Previously, 20 pack year history.  Headache Normal chronic headache. Patient states she takes Imitrex -Imitrex  Obesity Body mass index is 31.92 kg/m.   DVT prophylaxis: SQ heparin Code Status: Full Family Communication: Left VM  for friend  Howard Pouch [phone] on 05/05/2020 disposition Plan: Status is: Inpatient  Remains inpatient appropriate because:Inpatient level of care appropriate due to severity of illness   Dispo: The patient is from: Home              Anticipated d/c is to: SNF              Anticipated d/c date is: 2 days              Patient currently is not medically stable to d/c.   Worsening kidney function in the setting of acute decompensated heart failure.  Anticipate 2 days before medical readiness for discharge.  Anticipate disposition to skilled nursing facility.         Consultants:   Cardiology-[facility]  Nephrology-[facility]  Procedures:   Ultrasound-guided thoracentesis, 05/02/2020  Antimicrobials:   None   Subjective: Patient seen and examined.  On room air.  Swelling of feet improved.  Objective: Vitals:   05/06/20 1556 05/06/20 2058 05/07/20 0345 05/07/20 0750  BP: 131/62 132/62 129/65 (!) 152/68  Pulse: 73 74 71 70  Resp: 19 17 18 17   Temp: 98 F (36.7 C) 98.3 F (36.8 C) 98.1 F (36.7 C) 98.6 F (37 C)  TempSrc: Oral  Oral Oral  SpO2: 97% 99% 99% 97%  Weight:   90.7 kg   Height:        Intake/Output Summary (Last 24 hours) at 05/07/2020 1056 Last data filed at 05/07/2020 1012 Gross per 24 hour  Intake 1200 ml  Output 500 ml  Net 700 ml   Filed Weights   05/05/20 0420 05/06/20 0359 05/07/20 0345  Weight: 89.9 kg 90.2 kg 90.7 kg    Examination:  General exam: Appears calm and comfortable  Respiratory system: Bibasilar crackles.  Normal work of breathing.  Room air Cardiovascular system: S1-S2 heard, regular rate and rhythm, heart sounds distant, 2+ pitting edema bilaterally gastrointestinal system: Abdomen is nondistended, soft and nontender. No organomegaly or masses felt. Normal bowel sounds heard. Central nervous system: Alert and oriented. No focal neurological deficits. Extremities: Symmetric 5 x 5 power. Skin: No rashes, lesions or  ulcers Psychiatry: Judgement and insight appear normal. Mood & affect appropriate.     Data Reviewed: I have personally reviewed following labs and imaging studies  CBC: Recent Labs  Lab 04/30/20 1158 05/01/20 0519 05/05/20 0715  WBC 9.2 6.8 6.3  HGB 10.5* 8.6* 9.7*  HCT 34.4* 27.8* 30.0*  MCV 101.8* 101.1* 96.8  PLT 197 150 789*   Basic Metabolic Panel: Recent Labs  Lab 05/03/20 0408 05/04/20 0604 05/05/20 0551 05/06/20 0556 05/07/20 0553  NA 139 139 138 135 139  K 4.4 4.4 4.6 4.2 4.8  CL 110 109 109 105 109  CO2 21* 22 22 22 22   GLUCOSE 138* 84 85 107* 91  BUN 75* 84* 84* 85* 90*  CREATININE 2.97* 3.15* 3.06* 3.13* 3.32*  CALCIUM 8.0* 7.8* 7.6* 7.4* 7.8*  MG  --   --   --  1.9 1.9  PHOS 5.4* 5.6* 5.4* 5.2* 6.2*   GFR: Estimated Creatinine Clearance: 20.2 mL/min (A) (by C-G formula based on SCr of 3.32 mg/dL (H)). Liver Function Tests: Recent Labs  Lab 04/30/20 1601 05/02/20 0507 05/02/20 1100 05/03/20 0408 05/04/20 0604 05/05/20 0551 05/06/20 0556 05/07/20 0553  AST 20  --   --   --   --   --   --   --   ALT 26  --   --   --   --   --   --   --  ALKPHOS 68  --   --   --   --   --   --   --   BILITOT 0.9  --   --   --   --   --   --   --   PROT 6.6  --  5.6*  --   --   --   --   --   ALBUMIN 3.2*   < >  --  2.5* 2.2* 2.2* 2.3* 2.2*   < > = values in this interval not displayed.   No results for input(s): LIPASE, AMYLASE in the last 168 hours. No results for input(s): AMMONIA in the last 168 hours. Coagulation Profile: No results for input(s): INR, PROTIME in the last 168 hours. Cardiac Enzymes: No results for input(s): CKTOTAL, CKMB, CKMBINDEX, TROPONINI in the last 168 hours. BNP (last 3 results) No results for input(s): PROBNP in the last 8760 hours. HbA1C: No results for input(s): HGBA1C in the last 72 hours. CBG: Recent Labs  Lab 05/06/20 0801 05/06/20 1149 05/06/20 1631 05/06/20 2057 05/07/20 0751  GLUCAP 82 123* 124* 118* 85    Lipid Profile: No results for input(s): CHOL, HDL, LDLCALC, TRIG, CHOLHDL, LDLDIRECT in the last 72 hours. Thyroid Function Tests: No results for input(s): TSH, T4TOTAL, FREET4, T3FREE, THYROIDAB in the last 72 hours. Anemia Panel: No results for input(s): VITAMINB12, FOLATE, FERRITIN, TIBC, IRON, RETICCTPCT in the last 72 hours. Sepsis Labs: No results for input(s): PROCALCITON, LATICACIDVEN in the last 168 hours.  Recent Results (from the past 240 hour(s))  Resp Panel by RT-PCR (Flu A&B, Covid) Nasopharyngeal Swab     Status: None   Collection Time: 04/30/20  4:01 PM   Specimen: Nasopharyngeal Swab; Nasopharyngeal(NP) swabs in vial transport medium  Result Value Ref Range Status   SARS Coronavirus 2 by RT PCR NEGATIVE NEGATIVE Final    Comment: (NOTE) SARS-CoV-2 target nucleic acids are NOT DETECTED.  The SARS-CoV-2 RNA is generally detectable in upper respiratory specimens during the acute phase of infection. The lowest concentration of SARS-CoV-2 viral copies this assay can detect is 138 copies/mL. A negative result does not preclude SARS-Cov-2 infection and should not be used as the sole basis for treatment or other patient management decisions. A negative result may occur with  improper specimen collection/handling, submission of specimen other than nasopharyngeal swab, presence of viral mutation(s) within the areas targeted by this assay, and inadequate number of viral copies(<138 copies/mL). A negative result must be combined with clinical observations, patient history, and epidemiological information. The expected result is Negative.  Fact Sheet for Patients:  EntrepreneurPulse.com.au  Fact Sheet for Healthcare Providers:  IncredibleEmployment.be  This test is no t yet approved or cleared by the [place] FDA and  has been authorized for detection and/or diagnosis of SARS-CoV-2 by FDA under an Emergency Use Authorization  (EUA). This EUA will remain  in effect (meaning this test can be used) for the duration of the COVID-19 declaration under Section 564(b)(1) of the Act, 21 U.S.C.section 360bbb-3(b)(1), unless the authorization is terminated  or revoked sooner.       Influenza A by PCR NEGATIVE NEGATIVE Final   Influenza B by PCR NEGATIVE NEGATIVE Final    Comment: (NOTE) The Xpert Xpress SARS-CoV-2/FLU/RSV plus assay is intended as an aid in the diagnosis of influenza from Nasopharyngeal swab specimens and should not be used as a sole basis for treatment. Nasal washings and aspirates are unacceptable for Xpert Xpress SARS-CoV-2/FLU/RSV testing.  Fact  Sheet for Patients: EntrepreneurPulse.com.au  Fact Sheet for Healthcare Providers: IncredibleEmployment.be  This test is not yet approved or cleared by the [place] FDA and has been authorized for detection and/or diagnosis of SARS-CoV-2 by FDA under an Emergency Use Authorization (EUA). This EUA will remain in effect (meaning this test can be used) for the duration of the COVID-19 declaration under Section 564(b)(1) of the Act, 21 U.S.C. section 360bbb-3(b)(1), unless the authorization is terminated or revoked.  Performed at [facility], [address] [place], [place] [address]   Body fluid culture     Status: None   Collection Time: 05/02/20  4:44 PM   Specimen: PATH Cytology Pleural fluid  Result Value Ref Range Status   Specimen Description   Final    PLEURAL Performed at [facility], [address] [place], [place] [address]    Special Requests   Final    NONE Performed at [facility], [address] [place], [place] [address]    Gram Stain   Final    MODERATE WBC PRESENT, PREDOMINANTLY MONONUCLEAR NO ORGANISMS SEEN    Culture   Final    NO GROWTH Performed at [facility], [place] [address], [place], [place] [address]    Report Status 05/06/2020  FINAL  Final         Radiology Studies: No results found.      Scheduled Meds: . amLODipine  10 mg Oral Daily  . carvedilol  12.5 mg Oral BID WC  . heparin  5,000 Units Subcutaneous Q8H  . hydrALAZINE  50 mg Oral TID  . insulin aspart  0-9 Units Subcutaneous TID WC  . sodium bicarbonate  650 mg Oral TID   Continuous Infusions:   LOS: 6 days    Time spent: 25 minutes    Sidney Ace, MD [facility] Pager [phone]  If 7PM-7AM, please contact night-coverage 05/07/2020, 10:56 AM

## 2020-05-07 NOTE — Progress Notes (Signed)
[place], [name] 05/07/20  Subjective:   Hospital day # 6  Doing fair today.  Diagnosed with moderate pericardial effusion, mild to moderate aortic insufficiency and aortic stenosis. Reports multiple stressors in family life Moved to [place] in November 2021 Continues to have generalized weakness S/p thoracentesis rt lung - 1600 cc fluid removed-May 02, 2020  Renal: 12/24 0701 - 12/25 0700 In: 960 [P.O.:960] Out: 800 [Urine:800] Lab Results  Component Value Date   CREATININE 3.32 (H) 05/07/2020   CREATININE 3.13 (H) 05/06/2020   CREATININE 3.06 (H) 05/05/2020     Objective:  Vital signs in last 24 hours:  Temp:  [98 F (36.7 C)-98.7 F (37.1 C)] 98.7 F (37.1 C) (12/25 1135) Pulse Rate:  [68-74] 68 (12/25 1135) Resp:  [17-19] 18 (12/25 1135) BP: (121-152)/(58-68) 121/58 (12/25 1135) SpO2:  [97 %-99 %] 97 % (12/25 1135) Weight:  [90.7 kg] 90.7 kg (12/25 0345)  Weight change: 0.499 kg Filed Weights   05/05/20 0420 05/06/20 0359 05/07/20 0345  Weight: 89.9 kg 90.2 kg 90.7 kg    Intake/Output:    Intake/Output Summary (Last 24 hours) at 05/07/2020 1152 Last data filed at 05/07/2020 1012 Gross per 24 hour  Intake 1200 ml  Output 500 ml  Net 700 ml     Physical Exam: General:  Chronically ill-appearing, laying in the bed  HEENT  anicteric, moist oral mucous membranes  Pulm/lungs  normal breathing effort  CVS/Heart  regular, no rub  Abdomen:   Soft, nontender  Extremities: + Dependent edema  Neurologic:  Alert, able to answer questions  Skin:  No acute rashes    Basic Metabolic Panel:  Recent Labs  Lab 05/03/20 0408 05/04/20 0604 05/05/20 0551 05/06/20 0556 05/07/20 0553  NA 139 139 138 135 139  K 4.4 4.4 4.6 4.2 4.8  CL 110 109 109 105 109  CO2 21* 22 22 22 22   GLUCOSE 138* 84 85 107* 91  BUN 75* 84* 84* 85* 90*  CREATININE 2.97* 3.15* 3.06* 3.13* 3.32*  CALCIUM 8.0* 7.8* 7.6* 7.4* 7.8*  MG  --    --   --  1.9 1.9  PHOS 5.4* 5.6* 5.4* 5.2* 6.2*     CBC: Recent Labs  Lab 04/30/20 1158 05/01/20 0519 05/05/20 0715  WBC 9.2 6.8 6.3  HGB 10.5* 8.6* 9.7*  HCT 34.4* 27.8* 30.0*  MCV 101.8* 101.1* 96.8  PLT 197 150 137*     No results found for: HEPBSAG, HEPBSAB, HEPBIGM    Microbiology:  Recent Results (from the past 240 hour(s))  Resp Panel by RT-PCR (Flu A&B, Covid) Nasopharyngeal Swab     Status: None   Collection Time: 04/30/20  4:01 PM   Specimen: Nasopharyngeal Swab; Nasopharyngeal(NP) swabs in vial transport medium  Result Value Ref Range Status   SARS Coronavirus 2 by RT PCR NEGATIVE NEGATIVE Final    Comment: (NOTE) SARS-CoV-2 target nucleic acids are NOT DETECTED.  The SARS-CoV-2 RNA is generally detectable in upper respiratory specimens during the acute phase of infection. The lowest concentration of SARS-CoV-2 viral copies this assay can detect is 138 copies/mL. A negative result does not preclude SARS-Cov-2 infection and should not be used as the sole basis for treatment or other patient management decisions. A negative result may occur with  improper specimen collection/handling, submission of specimen other than nasopharyngeal swab, presence of viral mutation(s) within the areas targeted by this assay, and inadequate number of viral copies(<138 copies/mL). A negative result must  be combined with clinical observations, patient history, and epidemiological information. The expected result is Negative.  Fact Sheet for Patients:  EntrepreneurPulse.com.au  Fact Sheet for Healthcare Providers:  IncredibleEmployment.be  This test is no t yet approved or cleared by the [place] FDA and  has been authorized for detection and/or diagnosis of SARS-CoV-2 by FDA under an Emergency Use Authorization (EUA). This EUA will remain  in effect (meaning this test can be used) for the duration of the COVID-19 declaration under  Section 564(b)(1) of the Act, 21 U.S.C.section 360bbb-3(b)(1), unless the authorization is terminated  or revoked sooner.       Influenza A by PCR NEGATIVE NEGATIVE Final   Influenza B by PCR NEGATIVE NEGATIVE Final    Comment: (NOTE) The Xpert Xpress SARS-CoV-2/FLU/RSV plus assay is intended as an aid in the diagnosis of influenza from Nasopharyngeal swab specimens and should not be used as a sole basis for treatment. Nasal washings and aspirates are unacceptable for Xpert Xpress SARS-CoV-2/FLU/RSV testing.  Fact Sheet for Patients: EntrepreneurPulse.com.au  Fact Sheet for Healthcare Providers: IncredibleEmployment.be  This test is not yet approved or cleared by the [place] FDA and has been authorized for detection and/or diagnosis of SARS-CoV-2 by FDA under an Emergency Use Authorization (EUA). This EUA will remain in effect (meaning this test can be used) for the duration of the COVID-19 declaration under Section 564(b)(1) of the Act, 21 U.S.C. section 360bbb-3(b)(1), unless the authorization is terminated or revoked.  Performed at [facility], [address] [place], [place] [address]   Body fluid culture     Status: None   Collection Time: 05/02/20  4:44 PM   Specimen: PATH Cytology Pleural fluid  Result Value Ref Range Status   Specimen Description   Final    PLEURAL Performed at [facility], [address], [place], [place] [address]    Special Requests   Final    NONE Performed at [facility], [address] [place], [place] [address]    Gram Stain   Final    MODERATE WBC PRESENT, PREDOMINANTLY MONONUCLEAR NO ORGANISMS SEEN    Culture   Final    NO GROWTH Performed at [facility], [place] [address], [place], [place] [address]    Report Status 05/06/2020 FINAL  Final    Coagulation Studies: No results for input(s): LABPROT, INR in the last 72 hours.  Urinalysis: No results  for input(s): COLORURINE, LABSPEC, PHURINE, GLUCOSEU, HGBUR, BILIRUBINUR, KETONESUR, PROTEINUR, UROBILINOGEN, NITRITE, LEUKOCYTESUR in the last 72 hours.  Invalid input(s): APPERANCEUR    Imaging: No results found.   Medications:    . amLODipine  10 mg Oral Daily  . carvedilol  12.5 mg Oral BID WC  . heparin  5,000 Units Subcutaneous Q8H  . hydrALAZINE  50 mg Oral TID  . insulin aspart  0-9 Units Subcutaneous TID WC  . sodium bicarbonate  650 mg Oral TID   acetaminophen **OR** acetaminophen, guaiFENesin-dextromethorphan, hydrALAZINE, melatonin, ondansetron **OR** ondansetron (ZOFRAN) IV, oxyCODONE-acetaminophen  Assessment/ Plan:  64 y.o. female with heart failure with preserved EF, hypertension, CKD, stroke   admitted on 04/30/2020 for Chronic kidney disease (CKD), stage IV (severe) (HCC) [N18.4] Volume overload [E87.70]  # chronic kidney disease stage IV   Current creatinine of 3.06, GFR 17 Awaiting records from previous nephrology evaluation in [place].   Renal ultrasound shows echogenic kidneys Hemoglobin A1c 4.7% May 01, 2020 Underlying CKD likely secondary to hypertension and atherosclerosis Obtain 24-hour urine  for creatinine clearance-orders placed  #Proteinuria Urine protein to creatinine ratio 5.35 g May 03, 2020 Serological studies May 04, 2020: ANA negative, ANCA negative, kappa lambda ratio 1.6 Will consider ACE inhibitor/ARB once serum creatinine is determined to be stable  #Severe hypertension, lower extremity edema 2D echo with LVEF 55 to 60%, moderately elevated pulmonary artery systolic pressures, moderate pericardial effusion, Mild to moderate aortic stenosis  currently managed with torsemide as needed   LOS: [place] 12/25/202111:52 [place], [place]  Note: This note was prepared with Dragon dictation. Any transcription errors are unintentional

## 2020-05-08 ENCOUNTER — Inpatient Hospital Stay: Payer: Medicaid Other

## 2020-05-08 LAB — RENAL FUNCTION PANEL
Albumin: 2.2 g/dL — ABNORMAL LOW (ref 3.5–5.0)
Anion gap: 8 (ref 5–15)
BUN: 97 mg/dL — ABNORMAL HIGH (ref 8–23)
CO2: 22 mmol/L (ref 22–32)
Calcium: 7.9 mg/dL — ABNORMAL LOW (ref 8.9–10.3)
Chloride: 110 mmol/L (ref 98–111)
Creatinine, Ser: 3.5 mg/dL — ABNORMAL HIGH (ref 0.44–1.00)
GFR, Estimated: 14 mL/min — ABNORMAL LOW (ref >60)
Glucose, Bld: 79 mg/dL (ref 70–99)
Phosphorus: 6.5 mg/dL — ABNORMAL HIGH (ref 2.5–4.6)
Potassium: 4.5 mmol/L (ref 3.5–5.1)
Sodium: 140 mmol/L (ref 135–145)

## 2020-05-08 LAB — MPO/PR-3 (ANCA) ANTIBODIES
ANCA Proteinase 3: <3.5 U/mL (ref 0.0–3.5)
Myeloperoxidase Abs: <9 U/mL (ref 0.0–9.0)

## 2020-05-08 LAB — GLUCOSE, CAPILLARY
Glucose-Capillary: 108 mg/dL — ABNORMAL HIGH (ref 70–99)
Glucose-Capillary: 122 mg/dL — ABNORMAL HIGH (ref 70–99)
Glucose-Capillary: 110 mg/dL — ABNORMAL HIGH (ref 70–99)
Glucose-Capillary: 147 mg/dL — ABNORMAL HIGH (ref 70–99)

## 2020-05-08 LAB — MAGNESIUM: Magnesium: 1.9 mg/dL (ref 1.7–2.4)

## 2020-05-08 MED ORDER — SENNOSIDES-DOCUSATE SODIUM 8.6-50 MG PO TABS
1.0000 | ORAL_TABLET | Freq: Two times a day (BID) | ORAL | Status: DC
  Administered 2020-05-08 – 2020-07-02 (×87): 1 via ORAL
  Filled 2020-05-08 (×102): qty 1

## 2020-05-08 MED ORDER — BISACODYL 10 MG RE SUPP
10.0000 mg | Freq: Every day | RECTAL | Status: DC | PRN
  Administered 2020-05-11: 09:00:00 10 mg via RECTAL
  Filled 2020-05-08 (×2): qty 1

## 2020-05-08 MED ORDER — TORSEMIDE 20 MG PO TABS: 40.0000 mg | ORAL_TABLET | Freq: Every day | ORAL | Status: DC

## 2020-05-08 MED ORDER — HYDRALAZINE HCL 10 MG PO TABS
10.0000 mg | ORAL_TABLET | Freq: Three times a day (TID) | ORAL | Status: DC
  Administered 2020-05-08 – 2020-05-09 (×2): 10 mg via ORAL
  Filled 2020-05-08 (×3): qty 1

## 2020-05-08 MED ORDER — ALPRAZOLAM 0.25 MG PO TABS
0.2500 mg | ORAL_TABLET | Freq: Three times a day (TID) | ORAL | Status: DC | PRN
  Administered 2020-05-14 – 2020-06-21 (×20): 0.25 mg via ORAL
  Filled 2020-05-08 (×22): qty 1

## 2020-05-08 MED ORDER — POLYETHYLENE GLYCOL 3350 17 G PO PACK
17.0000 g | PACK | Freq: Every day | ORAL | Status: DC
  Administered 2020-05-08 – 2020-07-02 (×48): 17 g via ORAL
  Filled 2020-05-08 (×48): qty 1

## 2020-05-08 NOTE — Progress Notes (Signed)
[place], [name] 05/08/20  Subjective:   Hospital day # 7 Moved to [place] in November 2021 Diagnosed with moderate pericardial effusion, mild to moderate aortic insufficiency and aortic stenosis. Reports multiple stressors in family life  Continues to have generalized weakness S/p thoracentesis rt lung - 1600 cc fluid removed-May 02, 2020  Renal: 12/25 0701 - 12/26 0700 In: 240 [P.O.:240] Out: 550 [Urine:550] Lab Results  Component Value Date   CREATININE 3.50 (H) 05/08/2020   CREATININE 3.32 (H) 05/07/2020   CREATININE 3.13 (H) 05/06/2020     Objective:  Vital signs in last 24 hours:  Temp:  [98.2 F (36.8 C)-98.7 F (37.1 C)] 98.4 F (36.9 C) (12/26 1219) Pulse Rate:  [68-72] 71 (12/26 1219) Resp:  [16-18] 16 (12/26 1219) BP: (116-136)/(56-64) 124/59 (12/26 1219) SpO2:  [97 %-99 %] 98 % (12/26 1219) Weight:  [92 kg] 92 kg (12/26 0703)  Weight change:  Filed Weights   05/06/20 0359 05/07/20 0345 05/08/20 0703  Weight: 90.2 kg 90.7 kg 92 kg    Intake/Output:    Intake/Output Summary (Last 24 hours) at 05/08/2020 1357 Last data filed at 05/08/2020 1036 Gross per 24 hour  Intake 120 ml  Output 550 ml  Net -430 ml     Physical Exam: General:  Chronically ill-appearing, laying in the bed  HEENT  anicteric, moist oral mucous membranes  Pulm/lungs  normal breathing effort  CVS/Heart  regular, no rub  Abdomen:   Soft, nontender  Extremities: + Dependent edema  Neurologic:  Alert, able to answer questions  Skin:  No acute rashes    Basic Metabolic Panel:  Recent Labs  Lab 05/04/20 0604 05/05/20 0551 05/06/20 0556 05/07/20 0553 05/08/20 0518  NA 139 138 135 139 140  K 4.4 4.6 4.2 4.8 4.5  CL 109 109 105 109 110  CO2 22 22 22 22 22   GLUCOSE 84 85 107* 91 79  BUN 84* 84* 85* 90* 97*  CREATININE 3.15* 3.06* 3.13* 3.32* 3.50*  CALCIUM 7.8* 7.6* 7.4* 7.8* 7.9*  MG  --   --  1.9 1.9 1.9  PHOS 5.6* 5.4*  5.2* 6.2* 6.5*     CBC: Recent Labs  Lab 05/05/20 0715  WBC 6.3  HGB 9.7*  HCT 30.0*  MCV 96.8  PLT 137*     No results found for: HEPBSAG, HEPBSAB, HEPBIGM    Microbiology:  Recent Results (from the past 240 hour(s))  Resp Panel by RT-PCR (Flu A&B, Covid) Nasopharyngeal Swab     Status: None   Collection Time: 04/30/20  4:01 PM   Specimen: Nasopharyngeal Swab; Nasopharyngeal(NP) swabs in vial transport medium  Result Value Ref Range Status   SARS Coronavirus 2 by RT PCR NEGATIVE NEGATIVE Final    Comment: (NOTE) SARS-CoV-2 target nucleic acids are NOT DETECTED.  The SARS-CoV-2 RNA is generally detectable in upper respiratory specimens during the acute phase of infection. The lowest concentration of SARS-CoV-2 viral copies this assay can detect is 138 copies/mL. A negative result does not preclude SARS-Cov-2 infection and should not be used as the sole basis for treatment or other patient management decisions. A negative result may occur with  improper specimen collection/handling, submission of specimen other than nasopharyngeal swab, presence of viral mutation(s) within the areas targeted by this assay, and inadequate number of viral copies(<138 copies/mL). A negative result must be combined with clinical observations, patient history, and epidemiological information. The expected result is Negative.  Fact Sheet for Patients:  EntrepreneurPulse.com.au  Fact Sheet for Healthcare Providers:  IncredibleEmployment.be  This test is no t yet approved or cleared by the [place] FDA and  has been authorized for detection and/or diagnosis of SARS-CoV-2 by FDA under an Emergency Use Authorization (EUA). This EUA will remain  in effect (meaning this test can be used) for the duration of the COVID-19 declaration under Section 564(b)(1) of the Act, 21 U.S.C.section 360bbb-3(b)(1), unless the authorization is terminated  or revoked  sooner.       Influenza A by PCR NEGATIVE NEGATIVE Final   Influenza B by PCR NEGATIVE NEGATIVE Final    Comment: (NOTE) The Xpert Xpress SARS-CoV-2/FLU/RSV plus assay is intended as an aid in the diagnosis of influenza from Nasopharyngeal swab specimens and should not be used as a sole basis for treatment. Nasal washings and aspirates are unacceptable for Xpert Xpress SARS-CoV-2/FLU/RSV testing.  Fact Sheet for Patients: EntrepreneurPulse.com.au  Fact Sheet for Healthcare Providers: IncredibleEmployment.be  This test is not yet approved or cleared by the [place] FDA and has been authorized for detection and/or diagnosis of SARS-CoV-2 by FDA under an Emergency Use Authorization (EUA). This EUA will remain in effect (meaning this test can be used) for the duration of the COVID-19 declaration under Section 564(b)(1) of the Act, 21 U.S.C. section 360bbb-3(b)(1), unless the authorization is terminated or revoked.  Performed at [facility], [address] [place], [place] [address]   Body fluid culture     Status: None   Collection Time: 05/02/20  4:44 PM   Specimen: PATH Cytology Pleural fluid  Result Value Ref Range Status   Specimen Description   Final    PLEURAL Performed at [facility], [address] [place], [place] [address]    Special Requests   Final    NONE Performed at [facility], [address] [place], [place] [address]    Gram Stain   Final    MODERATE WBC PRESENT, PREDOMINANTLY MONONUCLEAR NO ORGANISMS SEEN    Culture   Final    NO GROWTH Performed at [facility], [place] [address] [place], [place] [address]    Report Status 05/06/2020 FINAL  Final    Coagulation Studies: No results for input(s): LABPROT, INR in the last 72 hours.  Urinalysis: No results for input(s): COLORURINE, LABSPEC, PHURINE, GLUCOSEU, HGBUR, BILIRUBINUR, KETONESUR, PROTEINUR, UROBILINOGEN,  NITRITE, LEUKOCYTESUR in the last 72 hours.  Invalid input(s): APPERANCEUR    Imaging: DG Chest 2 View  Result Date: 05/08/2020 CLINICAL DATA:  Status post thoracentesis. EXAM: CHEST - 2 VIEW COMPARISON:  05/02/2020 FINDINGS: The cardio pericardial silhouette is enlarged. Bibasilar atelectasis/infiltrate associated small to moderate bilateral pleural effusions. No pneumothorax. Bones are diffusely demineralized. IMPRESSION: Cardiomegaly with bibasilar collapse/consolidative opacity and small to moderate bilateral pleural effusions. Interval increase in right pleural fluid volume. Electronically Signed   By: Misty Stanley M.D.   On: 05/08/2020 07:53     Medications:    . amLODipine  10 mg Oral Daily  . carvedilol  12.5 mg Oral BID WC  . heparin  5,000 Units Subcutaneous Q8H  . hydrALAZINE  50 mg Oral TID  . insulin aspart  0-9 Units Subcutaneous TID WC  . polyethylene glycol  17 g Oral Daily  . senna-docusate  1 tablet Oral BID  . sodium bicarbonate  650 mg Oral TID  . [START ON 05/09/2020] torsemide  40 mg Oral Daily   acetaminophen **OR** acetaminophen, ALPRAZolam, bisacodyl, guaiFENesin-dextromethorphan, hydrALAZINE, melatonin, ondansetron **  OR** ondansetron (ZOFRAN) IV, oxyCODONE-acetaminophen  Assessment/ Plan:  64 y.o. female with heart failure with preserved EF, hypertension, CKD, stroke   admitted on 04/30/2020 for Chronic kidney disease (CKD), stage IV (severe) (HCC) [N18.4] Volume overload [E87.70]  #AKI with chronic kidney disease stage IV   Current creatinine of 3.5, GFR 14 Awaiting records from previous nephrology evaluation in [place].   Renal ultrasound shows echogenic kidneys Hemoglobin A1c 4.7% May 01, 2020 Underlying CKD likely secondary to hypertension and atherosclerosis  24-hour urine for creatinine clearance-collection in progress Avoid low blood pressures.  Decrease hydralazine as noted below  #Proteinuria Urine protein to creatinine ratio 5.35 g  May 03, 2020 Serological studies May 04, 2020: ANA negative, ANCA negative, kappa lambda ratio 1.6 Will consider ACE inhibitor/ARB once serum creatinine is determined to be stable  #Severe hypertension, lower extremity edema 2D echo with LVEF 55 to 60%, moderately elevated pulmonary artery systolic pressures, moderate pericardial effusion, Mild to moderate aortic stenosis  currently managed with torsemide as needed Avoid hypotension.  With severe atherosclerosis, target blood pressure may be 237-628 systolic We will decrease the dose of hydralazine for now   LOS: [place] 12/26/20211:57 PM  [place], [place]  Note: This note was prepared with Dragon dictation. Any transcription errors are unintentional

## 2020-05-08 NOTE — Progress Notes (Signed)
PROGRESS NOTE    Tarra Pence  UUV:[id number] DOB: 01/17/1956 DOA: 04/30/2020 PCP: Patient, [name]   Brief Narrative:   Johnnie Moten is a 64 y.o. female with a history of diabetes mellitus type 2, stage IV CKD, heart failure, hypertension. Patient presented secondary to dyspnea with evidence of volume overload and pleural effusion.  12/22: Patient still edematous.  Net -3 L since admission.  Creatinine worsening.  Seen by cardiology nephrology this morning.  12/23: Patient remains fluid overloaded with 2+ pitting edema bilateral lower extremities.  Concern regarding worsening renal function is limiting effective diuresis.  Patient net -3.7 L since admission.  12/24: Patient seen and examined.  Lower extremities remain edematous.  Patient net -5.4 L since admission.  Kidney function worsening.  12/25: Lower extremity edema improved.  Continues to diurese well.  Kidney function worsening.    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 Demadex was initiated with few days ago due to worsening kidney function 12/26: Kidney function continues to worsen. 24-hour urine collection in progress Plan: Resume torsemide 40 mg daily per nephrology recommendations Monitor kidney function Monitor and replace electrolytes Continue Coreg and hydralazine No ACE inhibitor Strict I's and O's Daily weights  Pericardial effusion Moderate size on echo No indication of tamponade Repeat limited echo in a few weeks  Pleural  effusion Status post ultrasound-guided thoracentesis 05/02/2020 Suspect transudate of process secondary to decompensated heart failure Notification of hemorrhagic or infected effusion  Diabetes mellitus, type 2 Hemoglobin A1C of 4.7%. No need for pharmacologic treatment. Diet control.  Acute kidney injury on chronic kidney disease stage IV Unknown baseline.  Patient is unsure of her primary nephrologist in [place].  Creatinine of 2.97 on admission.  Per patient, she was planned to start hemodialysis in addition to consideration for renal transplant.  Nephrology consulted on 12/20 12/25: Kidney function worsening Plan: Plan for urine collection Resume torsemide 40 mg daily Avoid nonessential nephrotoxins Daily renal function  Mild/moderate aortic stenosis Unsure if this is symptomatic.  Patient does have dyspnea with exertion but this may be multifactorial. -Cardiology recommendations: outpatient Transthoracic Echocardiogram   Essential hypertension Patient is on at least hydrochlorothiazide as an outpatient but is unsure of her regimen. Improved control on current regimen -Amlodipine 10 mg daily; continue diuresis -Cardiology added Coreg.  Dose increased to 12.5 mg twice daily -Continue hydralazine p.o..  Dose increased to 50 mg 3 times daily -Continue hydralazine 10 mg q6 hours prn -Uptitrate antihypertensive regimen as necessary  Anemia Possible related to underlying kidney disease.  Evidence of macrocytosis on CBC.  TIBC low but otherwise normal iron panel.  Vitamin B12 and folate normal.  Hyperkalemia In setting of CKD.  Lokelma 10 g given with improvement.  Pressure injury Mid coccyx, POA  History of tobacco use Patient has quit for at least 10 years.  Previously, 20 pack year history.  Headache Normal chronic headache. Patient states she takes Imitrex -Imitrex  Obesity Body mass index is 31.92 kg/m.   DVT prophylaxis: SQ heparin  Code Status:  Full Family Communication: Left VM for friend Howard Pouch [phone] on 05/05/2020 disposition Plan: Status is: Inpatient  Remains inpatient appropriate because:Inpatient level of care appropriate due to severity of illness   Dispo: The patient is from: Home              Anticipated d/c is to: SNF              Anticipated d/c date is: 2 days              Patient currently is not medically stable to d/c.   24 urine collection in progress. Nephrology to follow-up. Volume status improving. Anticipate medical readiness within 48 hours.         Consultants:   Cardiology-[facility]  Nephrology-[facility]  Procedures:   Ultrasound-guided thoracentesis, 05/02/2020  Antimicrobials:   None   Subjective: Patient seen and examined. Remains on room air. Heaviness and feet swelling improved.  Objective: Vitals:   05/07/20 2351 05/08/20 0509 05/08/20 0703 05/08/20 0739  BP: 116/60 127/64  (!) 127/56  Pulse: 72 70  68  Resp: 17     Temp: 98.5 F (36.9 C) 98.5 F (36.9 C)  98.2 F (36.8 C)  TempSrc: Oral Oral  Oral  SpO2: 97% 99%  98%  Weight:   92 kg   Height:        Intake/Output Summary (Last 24 hours) at 05/08/2020 1201 Last data filed at 05/08/2020 1036 Gross per 24 hour  Intake 120 ml  Output 550 ml  Net -430 ml   Filed Weights   05/06/20 0359 05/07/20 0345 05/08/20 0703  Weight: 90.2 kg 90.7 kg 92 kg    Examination:  General exam: Appears calm and comfortable  Respiratory system: Bibasilar crackles.  Normal work of breathing.  Room air Cardiovascular system: S1-S2 heard, regular rate and rhythm, heart sounds distant, 1+ pitting edema bilaterally gastrointestinal system: Abdomen is nondistended, soft and nontender. No organomegaly or masses felt. Normal bowel sounds heard. Central nervous system: Alert and oriented. No focal neurological deficits. Extremities: Symmetric 5 x 5 power. Skin: No rashes, lesions or ulcers Psychiatry: Judgement  and insight appear normal. Mood & affect appropriate.     Data Reviewed: I have personally reviewed following labs and imaging studies  CBC: Recent Labs  Lab 05/05/20 0715  WBC 6.3  HGB 9.7*  HCT 30.0*  MCV 96.8  PLT 124*   Basic Metabolic Panel: Recent Labs  Lab 05/04/20 0604 05/05/20 0551 05/06/20 0556 05/07/20 0553 05/08/20 0518  NA 139 138 135 139 140  K 4.4 4.6 4.2 4.8 4.5  CL 109 109 105 109 110  CO2 22 22 22 22 22   GLUCOSE 84 85 107* 91 79  BUN 84* 84* 85* 90* 97*  CREATININE 3.15* 3.06* 3.13* 3.32* 3.50*  CALCIUM 7.8* 7.6* 7.4* 7.8* 7.9*  MG  --   --  1.9 1.9 1.9  PHOS 5.6* 5.4* 5.2* 6.2* 6.5*   GFR: Estimated Creatinine Clearance: 19 mL/min (A) (by C-G formula based on SCr of 3.5 mg/dL (H)). Liver Function Tests: Recent Labs  Lab 05/02/20 1100 05/03/20 0408 05/04/20 0604 05/05/20 0551 05/06/20 0556 05/07/20 0553 05/08/20 0518  PROT 5.6*  --   --   --   --   --   --   ALBUMIN  --    < > 2.2* 2.2* 2.3* 2.2* 2.2*   < > = values in this interval not displayed.   No results for  input(s): LIPASE, AMYLASE in the last 168 hours. No results for input(s): AMMONIA in the last 168 hours. Coagulation Profile: No results for input(s): INR, PROTIME in the last 168 hours. Cardiac Enzymes: No results for input(s): CKTOTAL, CKMB, CKMBINDEX, TROPONINI in the last 168 hours. BNP (last 3 results) No results for input(s): PROBNP in the last 8760 hours. HbA1C: No results for input(s): HGBA1C in the last 72 hours. CBG: Recent Labs  Lab 05/07/20 0751 05/07/20 1136 05/07/20 1706 05/07/20 2125 05/08/20 0841  GLUCAP 85 97 130* 157* 108*   Lipid Profile: No results for input(s): CHOL, HDL, LDLCALC, TRIG, CHOLHDL, LDLDIRECT in the last 72 hours. Thyroid Function Tests: No results for input(s): TSH, T4TOTAL, FREET4, T3FREE, THYROIDAB in the last 72 hours. Anemia Panel: No results for input(s): VITAMINB12, FOLATE, FERRITIN, TIBC, IRON, RETICCTPCT in the last 72  hours. Sepsis Labs: No results for input(s): PROCALCITON, LATICACIDVEN in the last 168 hours.  Recent Results (from the past 240 hour(s))  Resp Panel by RT-PCR (Flu A&B, Covid) Nasopharyngeal Swab     Status: None   Collection Time: 04/30/20  4:01 PM   Specimen: Nasopharyngeal Swab; Nasopharyngeal(NP) swabs in vial transport medium  Result Value Ref Range Status   SARS Coronavirus 2 by RT PCR NEGATIVE NEGATIVE Final    Comment: (NOTE) SARS-CoV-2 target nucleic acids are NOT DETECTED.  The SARS-CoV-2 RNA is generally detectable in upper respiratory specimens during the acute phase of infection. The lowest concentration of SARS-CoV-2 viral copies this assay can detect is 138 copies/mL. A negative result does not preclude SARS-Cov-2 infection and should not be used as the sole basis for treatment or other patient management decisions. A negative result may occur with  improper specimen collection/handling, submission of specimen other than nasopharyngeal swab, presence of viral mutation(s) within the areas targeted by this assay, and inadequate number of viral copies(<138 copies/mL). A negative result must be combined with clinical observations, patient history, and epidemiological information. The expected result is Negative.  Fact Sheet for Patients:  EntrepreneurPulse.com.au  Fact Sheet for Healthcare Providers:  IncredibleEmployment.be  This test is no t yet approved or cleared by the [place] FDA and  has been authorized for detection and/or diagnosis of SARS-CoV-2 by FDA under an Emergency Use Authorization (EUA). This EUA will remain  in effect (meaning this test can be used) for the duration of the COVID-19 declaration under Section 564(b)(1) of the Act, 21 U.S.C.section 360bbb-3(b)(1), unless the authorization is terminated  or revoked sooner.       Influenza A by PCR NEGATIVE NEGATIVE Final   Influenza B by PCR NEGATIVE  NEGATIVE Final    Comment: (NOTE) The Xpert Xpress SARS-CoV-2/FLU/RSV plus assay is intended as an aid in the diagnosis of influenza from Nasopharyngeal swab specimens and should not be used as a sole basis for treatment. Nasal washings and aspirates are unacceptable for Xpert Xpress SARS-CoV-2/FLU/RSV testing.  Fact Sheet for Patients: EntrepreneurPulse.com.au  Fact Sheet for Healthcare Providers: IncredibleEmployment.be  This test is not yet approved or cleared by the [place] FDA and has been authorized for detection and/or diagnosis of SARS-CoV-2 by FDA under an Emergency Use Authorization (EUA). This EUA will remain in effect (meaning this test can be used) for the duration of the COVID-19 declaration under Section 564(b)(1) of the Act, 21 U.S.C. section 360bbb-3(b)(1), unless the authorization is terminated or revoked.  Performed at [facility], [address], [place], [place] [address]   Body fluid culture  Status: None   Collection Time: 05/02/20  4:44 PM   Specimen: PATH Cytology Pleural fluid  Result Value Ref Range Status   Specimen Description   Final    PLEURAL Performed at [facility], [address] [place], [place] [address]    Special Requests   Final    NONE Performed at [facility], [address] [place], [place] [address]    Gram Stain   Final    MODERATE WBC PRESENT, PREDOMINANTLY MONONUCLEAR NO ORGANISMS SEEN    Culture   Final    NO GROWTH Performed at [facility], [place] [address], [place],  [address]    Report Status 05/06/2020 FINAL  Final         Radiology Studies: DG Chest 2 View  Result Date: 05/08/2020 CLINICAL DATA:  Status post thoracentesis. EXAM: CHEST - 2 VIEW COMPARISON:  05/02/2020 FINDINGS: The cardio pericardial silhouette is enlarged. Bibasilar atelectasis/infiltrate associated small to moderate bilateral pleural effusions. No  pneumothorax. Bones are diffusely demineralized. IMPRESSION: Cardiomegaly with bibasilar collapse/consolidative opacity and small to moderate bilateral pleural effusions. Interval increase in right pleural fluid volume. Electronically Signed   By: Misty Stanley M.D.   On: 05/08/2020 07:53        Scheduled Meds: . amLODipine  10 mg Oral Daily  . carvedilol  12.5 mg Oral BID WC  . heparin  5,000 Units Subcutaneous Q8H  . hydrALAZINE  50 mg Oral TID  . insulin aspart  0-9 Units Subcutaneous TID WC  . polyethylene glycol  17 g Oral Daily  . senna-docusate  1 tablet Oral BID  . sodium bicarbonate  650 mg Oral TID  . torsemide  40 mg Oral Daily   Continuous Infusions:   LOS: 7 days    Time spent: 25 minutes    Sidney Ace, MD [facility] Pager [phone]  If 7PM-7AM, please contact night-coverage 05/08/2020, 12:01 PM

## 2020-05-08 NOTE — Progress Notes (Signed)
Progress Note  Patient Name: Katie [name] Date of Encounter: 05/08/2020  [facility] Cardiologist:  Fletcher Anon   Subjective   64 year old female, hx of COPD, HTN  who is admitted with CKD and volume overload. She continues to be aggressively diuresed.   .  Creatinine continues to slowly increase. Echocardiogram from December 19 reveals well-preserved left ventricular systolic function with an ejection fraction of 55 to 60%.  There is concentric LVH.  There is grade 2 diastolic dysfunction.  The right ventricular systolic function is moderately reduced.  There is moderately elevated pulmonary pressures with an estimated PA pressure of 51 mmHg.  Has diuresed 2.1 liters so far this admission   Inpatient Medications    Scheduled Meds: . amLODipine  10 mg Oral Daily  . carvedilol  12.5 mg Oral BID WC  . heparin  5,000 Units Subcutaneous Q8H  . hydrALAZINE  50 mg Oral TID  . insulin aspart  0-9 Units Subcutaneous TID WC  . polyethylene glycol  17 g Oral Daily  . senna-docusate  1 tablet Oral BID  . sodium bicarbonate  650 mg Oral TID   Continuous Infusions:  PRN Meds: acetaminophen **OR** acetaminophen, ALPRAZolam, bisacodyl, guaiFENesin-dextromethorphan, hydrALAZINE, melatonin, ondansetron **OR** ondansetron (ZOFRAN) IV, oxyCODONE-acetaminophen   Vital Signs    Vitals:   05/07/20 2351 05/08/20 0509 05/08/20 0703 05/08/20 0739  BP: 116/60 127/64  (!) 127/56  Pulse: 72 70  68  Resp: 17     Temp: 98.5 F (36.9 C) 98.5 F (36.9 C)  98.2 F (36.8 C)  TempSrc: Oral Oral  Oral  SpO2: 97% 99%  98%  Weight:   92 kg   Height:        Intake/Output Summary (Last 24 hours) at 05/08/2020 1016 Last data filed at 05/07/2020 2017 Gross per 24 hour  Intake --  Output 550 ml  Net -550 ml   Last 3 Weights 05/08/2020 05/07/2020 05/06/2020  Weight (lbs) 202 lb 14.4 oz 200 lb 198 lb 14.4 oz  Weight (kg) 92.035 kg 90.719 kg 90.22 kg      Telemetry    Sinus rhythm at 51  -  Personally Reviewed  ECG     - Personally Reviewed  Physical Exam   Physical Exam: Blood pressure (!) 127/56, pulse 68, temperature 98.2 F (36.8 C), temperature source Oral, resp. rate 17, height 5\' 6"  (1.676 m), weight 92 kg, SpO2 98 %.  GEN:  Middle age female.  Appears older than stated age  50: Normal NECK: No JVD; No carotid bruits LYMPHATICS: No lymphadenopathy CARDIAC: RRR  RESPIRATORY:  Clear to auscultation without rales, wheezing or rhonchi  ABDOMEN: Soft, non-tender, non-distended MUSCULOSKELETAL:  No edema; No deformity  SKIN: Warm and dry NEUROLOGIC:  Alert and oriented x 3   Labs    High Sensitivity Troponin:   Recent Labs  Lab 04/30/20 1601 04/30/20 2114  TROPONINIHS 24* 21*      Chemistry Recent Labs  Lab 05/02/20 1100 05/03/20 0408 05/06/20 0556 05/07/20 0553 05/08/20 0518  NA  --    < > 135 139 140  K  --    < > 4.2 4.8 4.5  CL  --    < > 105 109 110  CO2  --    < > 22 22 22   GLUCOSE  --    < > 107* 91 79  BUN  --    < > 85* 90* 97*  CREATININE  --    < > 3.13*  3.32* 3.50*  CALCIUM  --    < > 7.4* 7.8* 7.9*  PROT 5.6*  --   --   --   --   ALBUMIN  --    < > 2.3* 2.2* 2.2*  GFRNONAA  --    < > 16* 15* 14*  ANIONGAP  --    < > 8 8 8    < > = values in this interval not displayed.     Hematology Recent Labs  Lab 05/01/20 1802 05/05/20 0715  WBC  --  6.3  RBC 3.03* 3.10*  HGB  --  9.7*  HCT  --  30.0*  MCV  --  96.8  MCH  --  31.3  MCHC  --  32.3  RDW  --  13.6  PLT  --  137*    BNP No results for input(s): BNP, PROBNP in the last 168 hours.   DDimer No results for input(s): DDIMER in the last 168 hours.   Radiology    DG Chest 2 View  Result Date: 05/08/2020 CLINICAL DATA:  Status post thoracentesis. EXAM: CHEST - 2 VIEW COMPARISON:  05/02/2020 FINDINGS: The cardio pericardial silhouette is enlarged. Bibasilar atelectasis/infiltrate associated small to moderate bilateral pleural effusions. No pneumothorax. Bones are  diffusely demineralized. IMPRESSION: Cardiomegaly with bibasilar collapse/consolidative opacity and small to moderate bilateral pleural effusions. Interval increase in right pleural fluid volume. Electronically Signed   By: Misty Stanley M.D.   On: 05/08/2020 07:53    Cardiac Studies      Patient Profile     64 y.o. female admited with acute kidney injury and diastolic dysfunction Has moderte pulmonary HTN  Assessment & Plan    . 1.  1.  Acute on chronic diastolic congestive heart failure:  Her creatinine continues to increase BP is fairly well controlled Lasix is on hold currently given increase in creatinine    2.  Hypertension: Continue current medications. On amlodipine, hydralazine , coreg.   3.  Aortic stenosis, - mild - moderate   4. Pleural effusions:    Her right pleural effusion appears to be larger compared to her previous CXR.  Further plans per primary team         For questions or updates, please contact [place] Please consult [email or web address] for contact info under        Signed, Mertie Moores, MD  05/08/2020, 10:16 AM

## 2020-05-09 LAB — RENAL FUNCTION PANEL
Albumin: 2.2 g/dL — ABNORMAL LOW (ref 3.5–5.0)
Anion gap: 6 (ref 5–15)
BUN: 94 mg/dL — ABNORMAL HIGH (ref 8–23)
CO2: 24 mmol/L (ref 22–32)
Calcium: 7.9 mg/dL — ABNORMAL LOW (ref 8.9–10.3)
Chloride: 110 mmol/L (ref 98–111)
Creatinine, Ser: 3.47 mg/dL — ABNORMAL HIGH (ref 0.44–1.00)
GFR, Estimated: 14 mL/min — ABNORMAL LOW (ref >60)
Glucose, Bld: 102 mg/dL — ABNORMAL HIGH (ref 70–99)
Phosphorus: 7 mg/dL — ABNORMAL HIGH (ref 2.5–4.6)
Potassium: 4.9 mmol/L (ref 3.5–5.1)
Sodium: 140 mmol/L (ref 135–145)

## 2020-05-09 LAB — CREATININE CLEARANCE, URINE, 24 HOUR
Collection Interval-CRCL: 24 hours
Creatinine Clearance: 9 mL/min — ABNORMAL LOW (ref 75–115)
Creatinine, 24H Ur: 460 mg/d — ABNORMAL LOW (ref 600–1800)
Creatinine, Urine: 92 mg/dL
Urine Total Volume-CRCL: 500 mL

## 2020-05-09 LAB — GLUCOSE, CAPILLARY
Glucose-Capillary: 108 mg/dL — ABNORMAL HIGH (ref 70–99)
Glucose-Capillary: 124 mg/dL — ABNORMAL HIGH (ref 70–99)
Glucose-Capillary: 107 mg/dL — ABNORMAL HIGH (ref 70–99)
Glucose-Capillary: 102 mg/dL — ABNORMAL HIGH (ref 70–99)

## 2020-05-09 LAB — MAGNESIUM: Magnesium: 1.9 mg/dL (ref 1.7–2.4)

## 2020-05-09 MED ORDER — ACETAMINOPHEN 325 MG PO TABS
650.0000 mg | ORAL_TABLET | Freq: Four times a day (QID) | ORAL | Status: DC | PRN
  Administered 2020-05-12 – 2020-06-19 (×5): 650 mg via ORAL
  Filled 2020-05-09 (×7): qty 2

## 2020-05-09 MED ORDER — SODIUM CHLORIDE 0.9% FLUSH
10.0000 mL | Freq: Two times a day (BID) | INTRAVENOUS | Status: DC
  Administered 2020-05-09 – 2020-05-17 (×11): 10 mL via INTRAVENOUS

## 2020-05-09 MED ORDER — ACETAMINOPHEN 650 MG RE SUPP: 650.0000 mg | Freq: Four times a day (QID) | RECTAL | Status: DC | PRN

## 2020-05-09 MED ORDER — SODIUM CHLORIDE 0.9 % IV SOLN: 250.0000 mL | INTRAVENOUS | Status: DC | PRN

## 2020-05-09 MED ORDER — FUROSEMIDE 10 MG/ML IJ SOLN
80.0000 mg | Freq: Two times a day (BID) | INTRAMUSCULAR | Status: DC
  Administered 2020-05-09 – 2020-05-10 (×3): 80 mg via INTRAVENOUS
  Filled 2020-05-09 (×3): qty 8

## 2020-05-09 MED ORDER — SODIUM CHLORIDE 0.9 % IV SOLN: INTRAVENOUS | Status: DC

## 2020-05-09 NOTE — Progress Notes (Signed)
PROGRESS NOTE    Katie [name]  WFU:[id number] DOB: July 11, 1955 DOA: 04/30/2020 PCP: Patient, [name]   Brief Narrative:   Katie [name] is a 64 y.o. female with a history of diabetes mellitus type 2, stage IV CKD, heart failure, hypertension. Patient presented secondary to dyspnea with evidence of volume overload and pleural effusion.  12/22: Patient still edematous.  Net -3 L since admission.  Creatinine worsening.  Seen by cardiology nephrology this morning.  12/23: Patient remains fluid overloaded with 2+ pitting edema bilateral lower extremities.  Concern regarding worsening renal function is limiting effective diuresis.  Patient net -3.7 L since admission.  12/24: Patient seen and examined.  Lower extremities remain edematous.  Patient net -5.4 L since admission.  Kidney function worsening.  12/25: Lower extremity edema improved.  Continues to diurese well.  Kidney function worsening.  12/27: Remains edematous.  Seen by cardiology.  Recommending increase Lasix dose.  Patient continues to not respond anticipate right heart catheterization during this hospitalization.    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 Demadex was initiated with few days ago due to worsening kidney function 12/26: Kidney function continues to worsen. 24-hour urine collection in progress 12/27: Still with fluid overload.  Cardiology adjusted diuretic dosing Plan: Lasix 80 mg twice daily per cardiology recommendations Monitor kidney  function Monitor and replace electrolytes Continue Coreg and hydralazine No ACE inhibitor Strict I's and O's Daily weights Possible right heart cath if patient continues not to have adequate urinary output Cardiology to follow-up  Pericardial effusion Moderate size on echo No indication of tamponade Repeat limited echo in a few weeks  Pleural effusion Status post ultrasound-guided thoracentesis 05/02/2020 Suspect transudate of process secondary to decompensated heart failure No evidence of hemorrhagic or infected effusion  Diabetes mellitus, type 2 Hemoglobin A1C of 4.7%. No need for pharmacologic treatment. Diet control.  Acute kidney injury on chronic kidney disease stage IV Unknown baseline.  Patient is unsure of her primary nephrologist in [place].  Creatinine of 2.97 on admission.  Per patient, she was planned to start hemodialysis in addition to consideration for renal transplant.  Nephrology consulted on 12/20 12/25: Kidney function worsening 12/27: Status post 24 urine collection Plan: Lasix 80 mg IV twice daily Avoid nonessential nephrotoxins Daily renal function  Mild/moderate aortic stenosis Unsure if this is symptomatic.  Patient does have dyspnea with exertion but this may be multifactorial. -Cardiology recommendations: outpatient Transthoracic Echocardiogram   Essential hypertension Patient is on at least hydrochlorothiazide as an outpatient but is unsure of her regimen. Improved control on current regimen -Amlodipine 10 mg daily; continue diuresis -Cardiology added Coreg.  Dose increased to 12.5 mg twice daily -Continue hydralazine p.o..  Dose increased to 50 mg 3 times daily -Continue hydralazine 10 mg q6 hours prn -Uptitrate antihypertensive regimen as necessary  Anemia Possible related to underlying kidney disease.  Evidence of macrocytosis on CBC.  TIBC low but otherwise normal iron panel.  Vitamin B12 and folate normal.  Hyperkalemia In  setting of CKD.  Lokelma 10 g given with improvement.  Pressure injury Mid coccyx, POA  History of tobacco use Patient has quit for at least 10 years.  Previously, 20 pack year history.  Headache Normal chronic headache. Patient states she takes Imitrex -Imitrex  Obesity Body mass index is 31.92 kg/m.   DVT prophylaxis: SQ heparin Code Status: Full Family Communication: Left VM for friend Howard Pouch [phone] on 05/05/2020 disposition Plan: Status is: Inpatient  Remains inpatient appropriate because:Inpatient level of care appropriate due to severity of illness   Dispo: The patient is from: Home              Anticipated d/c is to: SNF              Anticipated d/c date is: 2 days              Patient currently is not medically stable to d/c.   Continues to require IV diuretics.  Fluid overload persistent.  Nephrology and cardiology to follow-up.  Possible right heart cath if urinary output remains inadequate.         Consultants:   Cardiology-[facility]  Nephrology-[facility]  Procedures:   Ultrasound-guided thoracentesis, 05/02/2020  Antimicrobials:   None   Subjective: Patient seen and examined.  On room air.  Still with heaviness of the feet.  No pain complaints  Objective: Vitals:   05/08/20 1632 05/08/20 2039 05/09/20 0437 05/09/20 0752  BP: 136/62 137/63 134/65 (!) 157/70  Pulse: 72 68 70 70  Resp: (!) 22 17 17    Temp: 98.9 F (37.2 C) 98.3 F (36.8 C) 98.2 F (36.8 C) 97.6 F (36.4 C)  TempSrc: Oral Oral Oral Oral  SpO2: 98% 99% 97% 98%  Weight:   88.5 kg   Height:        Intake/Output Summary (Last 24 hours) at 05/09/2020 1054 Last data filed at 05/09/2020 0945 Gross per 24 hour  Intake 600 ml  Output 450 ml  Net 150 ml   Filed Weights   05/07/20 0345 05/08/20 0703 05/09/20 0437  Weight: 90.7 kg 92 kg 88.5 kg    Examination:  General exam: Appears calm and comfortable  Respiratory system: Bibasilar  crackles.  Normal work of breathing.  Room air Cardiovascular system: S1-S2 heard, regular rate and rhythm, heart sounds distant, 2+ pitting edema bilaterally gastrointestinal system: Abdomen is nondistended, soft and nontender. No organomegaly or masses felt. Normal bowel sounds heard. Central nervous system: Alert and oriented. No focal neurological deficits. Extremities: Symmetric 5 x 5 power. Skin: No rashes, lesions or ulcers Psychiatry: Judgement and insight appear normal. Mood & affect appropriate.     Data Reviewed: I have personally reviewed following labs and imaging studies  CBC: Recent Labs  Lab 05/05/20 0715  WBC 6.3  HGB 9.7*  HCT 30.0*  MCV 96.8  PLT 324*   Basic Metabolic Panel: Recent Labs  Lab 05/05/20 0551 05/06/20 0556 05/07/20 0553 05/08/20 0518 05/09/20 0503  NA 138 135 139 140 140  K 4.6 4.2 4.8 4.5 4.9  CL 109 105 109 110 110  CO2 22 22 22 22 24   GLUCOSE 85 107* 91 79 102*  BUN 84* 85* 90* 97* 94*  CREATININE 3.06* 3.13* 3.32* 3.50* 3.47*  CALCIUM 7.6* 7.4* 7.8* 7.9* 7.9*  MG  --  1.9 1.9 1.9 1.9  PHOS 5.4* 5.2* 6.2* 6.5* 7.0*   GFR: Estimated Creatinine Clearance: 18.8 mL/min (A) (by C-G formula based on SCr of 3.47 mg/dL (H)). Liver  Function Tests: Recent Labs  Lab 05/02/20 1100 05/03/20 0408 05/05/20 0551 05/06/20 0556 05/07/20 0553 05/08/20 0518 05/09/20 0503  PROT 5.6*  --   --   --   --   --   --   ALBUMIN  --    < > 2.2* 2.3* 2.2* 2.2* 2.2*   < > = values in this interval not displayed.   No results for input(s): LIPASE, AMYLASE in the last 168 hours. No results for input(s): AMMONIA in the last 168 hours. Coagulation Profile: No results for input(s): INR, PROTIME in the last 168 hours. Cardiac Enzymes: No results for input(s): CKTOTAL, CKMB, CKMBINDEX, TROPONINI in the last 168 hours. BNP (last 3 results) No results for input(s): PROBNP in the last 8760 hours. HbA1C: No results for input(s): HGBA1C in the last 72  hours. CBG: Recent Labs  Lab 05/08/20 0841 05/08/20 1200 05/08/20 1658 05/08/20 2040 05/09/20 0753  GLUCAP 108* 122* 110* 147* 108*   Lipid Profile: No results for input(s): CHOL, HDL, LDLCALC, TRIG, CHOLHDL, LDLDIRECT in the last 72 hours. Thyroid Function Tests: No results for input(s): TSH, T4TOTAL, FREET4, T3FREE, THYROIDAB in the last 72 hours. Anemia Panel: No results for input(s): VITAMINB12, FOLATE, FERRITIN, TIBC, IRON, RETICCTPCT in the last 72 hours. Sepsis Labs: No results for input(s): PROCALCITON, LATICACIDVEN in the last 168 hours.  Recent Results (from the past 240 hour(s))  Resp Panel by RT-PCR (Flu A&B, Covid) Nasopharyngeal Swab     Status: None   Collection Time: 04/30/20  4:01 PM   Specimen: Nasopharyngeal Swab; Nasopharyngeal(NP) swabs in vial transport medium  Result Value Ref Range Status   SARS Coronavirus 2 by RT PCR NEGATIVE NEGATIVE Final    Comment: (NOTE) SARS-CoV-2 target nucleic acids are NOT DETECTED.  The SARS-CoV-2 RNA is generally detectable in upper respiratory specimens during the acute phase of infection. The lowest concentration of SARS-CoV-2 viral copies this assay can detect is 138 copies/mL. A negative result does not preclude SARS-Cov-2 infection and should not be used as the sole basis for treatment or other patient management decisions. A negative result may occur with  improper specimen collection/handling, submission of specimen other than nasopharyngeal swab, presence of viral mutation(s) within the areas targeted by this assay, and inadequate number of viral copies(<138 copies/mL). A negative result must be combined with clinical observations, patient history, and epidemiological information. The expected result is Negative.  Fact Sheet for Patients:  EntrepreneurPulse.com.au  Fact Sheet for Healthcare Providers:  IncredibleEmployment.be  This test is no t yet approved or cleared by  the [place] FDA and  has been authorized for detection and/or diagnosis of SARS-CoV-2 by FDA under an Emergency Use Authorization (EUA). This EUA will remain  in effect (meaning this test can be used) for the duration of the COVID-19 declaration under Section 564(b)(1) of the Act, 21 U.S.C.section 360bbb-3(b)(1), unless the authorization is terminated  or revoked sooner.       Influenza A by PCR NEGATIVE NEGATIVE Final   Influenza B by PCR NEGATIVE NEGATIVE Final    Comment: (NOTE) The Xpert Xpress SARS-CoV-2/FLU/RSV plus assay is intended as an aid in the diagnosis of influenza from Nasopharyngeal swab specimens and should not be used as a sole basis for treatment. Nasal washings and aspirates are unacceptable for Xpert Xpress SARS-CoV-2/FLU/RSV testing.  Fact Sheet for Patients: EntrepreneurPulse.com.au  Fact Sheet for Healthcare Providers: IncredibleEmployment.be  This test is not yet approved or cleared by the [place] and has been authorized  for detection and/or diagnosis of SARS-CoV-2 by FDA under an Emergency Use Authorization (EUA). This EUA will remain in effect (meaning this test can be used) for the duration of the COVID-19 declaration under Section 564(b)(1) of the Act, 21 U.S.C. section 360bbb-3(b)(1), unless the authorization is terminated or revoked.  Performed at [facility], [address] [place], [place] [address]   Body fluid culture     Status: None   Collection Time: 05/02/20  4:44 PM   Specimen: PATH Cytology Pleural fluid  Result Value Ref Range Status   Specimen Description   Final    PLEURAL Performed at [facility], [address], [place], [place] [address]    Special Requests   Final    NONE Performed at [facility], [address] [place], [place] [address]    Gram Stain   Final    MODERATE WBC PRESENT, PREDOMINANTLY MONONUCLEAR NO ORGANISMS SEEN     Culture   Final    NO GROWTH Performed at [facility], [place] [address] [place], [place] [address]    Report Status 05/06/2020 FINAL  Final         Radiology Studies: DG Chest 2 View  Result Date: 05/08/2020 CLINICAL DATA:  Status post thoracentesis. EXAM: CHEST - 2 VIEW COMPARISON:  05/02/2020 FINDINGS: The cardio pericardial silhouette is enlarged. Bibasilar atelectasis/infiltrate associated small to moderate bilateral pleural effusions. No pneumothorax. Bones are diffusely demineralized. IMPRESSION: Cardiomegaly with bibasilar collapse/consolidative opacity and small to moderate bilateral pleural effusions. Interval increase in right pleural fluid volume. Electronically Signed   By: Misty Stanley M.D.   On: 05/08/2020 07:53        Scheduled Meds: . amLODipine  10 mg Oral Daily  . carvedilol  12.5 mg Oral BID WC  . furosemide  80 mg Intravenous BID  . heparin  5,000 Units Subcutaneous Q8H  . insulin aspart  0-9 Units Subcutaneous TID WC  . polyethylene glycol  17 g Oral Daily  . senna-docusate  1 tablet Oral BID  . sodium bicarbonate  650 mg Oral TID   Continuous Infusions:   LOS: 8 days    Time spent: 25 minutes    Sidney Ace, MD [facility] Pager [phone]  If 7PM-7AM, please contact night-coverage 05/09/2020, 10:54 AM

## 2020-05-09 NOTE — Progress Notes (Signed)
[place], [name] 05/09/20  Subjective:   Hospital day # 8 Patient with known underlying chronic kidney disease stage IV.  Creatinine currently 3.5. Patient resting comfortably in bed at the moment.  Renal: 12/26 0701 - 12/27 0700 In: 240 [P.O.:240] Out: 150 [Urine:150] Lab Results  Component Value Date   CREATININE 3.47 (H) 05/09/2020   CREATININE 3.50 (H) 05/08/2020   CREATININE 3.32 (H) 05/07/2020     Objective:  Vital signs in last 24 hours:  Temp:  [97.6 F (36.4 C)-98.9 F (37.2 C)] 98.1 F (36.7 C) (12/27 1136) Pulse Rate:  [68-72] 68 (12/27 1136) Resp:  [17-22] 17 (12/27 0437) BP: (124-157)/(54-70) 124/54 (12/27 1136) SpO2:  [97 %-99 %] 98 % (12/27 1136) Weight:  [88.5 kg] 88.5 kg (12/27 0437)  Weight change:  Filed Weights   05/07/20 0345 05/08/20 0703 05/09/20 0437  Weight: 90.7 kg 92 kg 88.5 kg    Intake/Output:    Intake/Output Summary (Last 24 hours) at 05/09/2020 1423 Last data filed at 05/09/2020 1350 Gross per 24 hour  Intake 720 ml  Output 450 ml  Net 270 ml     Physical Exam: General:  Chronically ill-appearing, laying in the bed  HEENT  anicteric, moist oral mucous membranes  Pulm/lungs  normal breathing effort  CVS/Heart  regular, no rub  Abdomen:   Soft, nontender  Extremities: + Dependent edema  Neurologic:  Alert, able to answer questions  Skin:  No acute rashes    Basic Metabolic Panel:  Recent Labs  Lab 05/05/20 0551 05/06/20 0556 05/07/20 0553 05/08/20 0518 05/09/20 0503  NA 138 135 139 140 140  K 4.6 4.2 4.8 4.5 4.9  CL 109 105 109 110 110  CO2 $Re'22 22 22 22 24  'mRF$ GLUCOSE 85 107* 91 79 102*  BUN 84* 85* 90* 97* 94*  CREATININE 3.06* 3.13* 3.32* 3.50* 3.47*  CALCIUM 7.6* 7.4* 7.8* 7.9* 7.9*  MG  --  1.9 1.9 1.9 1.9  PHOS 5.4* 5.2* 6.2* 6.5* 7.0*     CBC: Recent Labs  Lab 05/05/20 0715  WBC 6.3  HGB 9.7*  HCT 30.0*  MCV 96.8  PLT 137*     No results found for: HEPBSAG,  HEPBSAB, HEPBIGM    Microbiology:  Recent Results (from the past 240 hour(s))  Resp Panel by RT-PCR (Flu A&B, Covid) Nasopharyngeal Swab     Status: None   Collection Time: 04/30/20  4:01 PM   Specimen: Nasopharyngeal Swab; Nasopharyngeal(NP) swabs in vial transport medium  Result Value Ref Range Status   SARS Coronavirus 2 by RT PCR NEGATIVE NEGATIVE Final    Comment: (NOTE) SARS-CoV-2 target nucleic acids are NOT DETECTED.  The SARS-CoV-2 RNA is generally detectable in upper respiratory specimens during the acute phase of infection. The lowest concentration of SARS-CoV-2 viral copies this assay can detect is 138 copies/mL. A negative result does not preclude SARS-Cov-2 infection and should not be used as the sole basis for treatment or other patient management decisions. A negative result may occur with  improper specimen collection/handling, submission of specimen other than nasopharyngeal swab, presence of viral mutation(s) within the areas targeted by this assay, and inadequate number of viral copies(<138 copies/mL). A negative result must be combined with clinical observations, patient history, and epidemiological information. The expected result is Negative.  Fact Sheet for Patients:  EntrepreneurPulse.com.au  Fact Sheet for Healthcare Providers:  IncredibleEmployment.be  This test is no t yet approved or cleared by the [place] FDA  and  has been authorized for detection and/or diagnosis of SARS-CoV-2 by FDA under an Emergency Use Authorization (EUA). This EUA will remain  in effect (meaning this test can be used) for the duration of the COVID-19 declaration under Section 564(b)(1) of the Act, 21 U.S.C.section 360bbb-3(b)(1), unless the authorization is terminated  or revoked sooner.       Influenza A by PCR NEGATIVE NEGATIVE Final   Influenza B by PCR NEGATIVE NEGATIVE Final    Comment: (NOTE) The Xpert Xpress  SARS-CoV-2/FLU/RSV plus assay is intended as an aid in the diagnosis of influenza from Nasopharyngeal swab specimens and should not be used as a sole basis for treatment. Nasal washings and aspirates are unacceptable for Xpert Xpress SARS-CoV-2/FLU/RSV testing.  Fact Sheet for Patients: EntrepreneurPulse.com.au  Fact Sheet for Healthcare Providers: IncredibleEmployment.be  This test is not yet approved or cleared by the [place] FDA and has been authorized for detection and/or diagnosis of SARS-CoV-2 by FDA under an Emergency Use Authorization (EUA). This EUA will remain in effect (meaning this test can be used) for the duration of the COVID-19 declaration under Section 564(b)(1) of the Act, 21 U.S.C. section 360bbb-3(b)(1), unless the authorization is terminated or revoked.  Performed at [facility], [address] [place], [place] [address]   Body fluid culture     Status: None   Collection Time: 05/02/20  4:44 PM   Specimen: PATH Cytology Pleural fluid  Result Value Ref Range Status   Specimen Description   Final    PLEURAL Performed at [facility], [address], [place], [place] [address]    Special Requests   Final    NONE Performed at [facility], [address] [place], [place] [address]    Gram Stain   Final    MODERATE WBC PRESENT, PREDOMINANTLY MONONUCLEAR NO ORGANISMS SEEN    Culture   Final    NO GROWTH Performed at [facility], [place] [address] [place], [place] [address]    Report Status 05/06/2020 FINAL  Final    Coagulation Studies: No results for input(s): LABPROT, INR in the last 72 hours.  Urinalysis: No results for input(s): COLORURINE, LABSPEC, PHURINE, GLUCOSEU, HGBUR, BILIRUBINUR, KETONESUR, PROTEINUR, UROBILINOGEN, NITRITE, LEUKOCYTESUR in the last 72 hours.  Invalid input(s): APPERANCEUR    Imaging: DG Chest 2 View  Result Date: 05/08/2020 CLINICAL  DATA:  Status post thoracentesis. EXAM: CHEST - 2 VIEW COMPARISON:  05/02/2020 FINDINGS: The cardio pericardial silhouette is enlarged. Bibasilar atelectasis/infiltrate associated small to moderate bilateral pleural effusions. No pneumothorax. Bones are diffusely demineralized. IMPRESSION: Cardiomegaly with bibasilar collapse/consolidative opacity and small to moderate bilateral pleural effusions. Interval increase in right pleural fluid volume. Electronically Signed   By: Misty Stanley M.D.   On: 05/08/2020 07:53     Medications:    . amLODipine  10 mg Oral Daily  . carvedilol  12.5 mg Oral BID WC  . furosemide  80 mg Intravenous BID  . heparin  5,000 Units Subcutaneous Q8H  . insulin aspart  0-9 Units Subcutaneous TID WC  . polyethylene glycol  17 g Oral Daily  . senna-docusate  1 tablet Oral BID  . sodium bicarbonate  650 mg Oral TID   acetaminophen **OR** acetaminophen, ALPRAZolam, bisacodyl, guaiFENesin-dextromethorphan, melatonin, ondansetron **OR** ondansetron (ZOFRAN) IV, oxyCODONE-acetaminophen  Assessment/ Plan:  64 y.o. female with heart failure with preserved EF, hypertension, CKD, stroke   admitted on 04/30/2020 for Chronic kidney disease (CKD), stage IV (severe) ([place]) [N18.4]  Volume overload [E87.70]  #AKI with chronic kidney disease stage IV   Current creatinine of 3.5, GFR 14 Awaiting records from previous nephrology evaluation in [place].   Renal ultrasound shows echogenic kidneys Hemoglobin A1c 4.7% May 01, 2020 Underlying CKD likely secondary to hypertension and atherosclerosis Creatinine clearance came back low at 9. Creatinine currently 3.47 with an EGFR 14. May need to consider dialysis in the relative near future if renal function remains low.  #Proteinuria Urine protein to creatinine ratio 5.35 g May 03, 2020 Serological studies May 04, 2020: ANA negative, ANCA negative, kappa lambda ratio 1.6 Will consider ACE inhibitor/ARB once serum  creatinine is determined to be stable  #Severe hypertension, lower extremity edema 2D echo with LVEF 55 to 60%, moderately elevated pulmonary artery systolic pressures, moderate pericardial effusion, Mild to moderate aortic stenosis  currently managed with torsemide as needed Avoid hypotension.  With severe atherosclerosis, target blood pressure may be 233-612 systolic We will decrease the dose of hydralazine for now   LOS: 8 Chelbie Jarnagin 12/27/20212:23 PM  [place], [place]  Note: This note was prepared with Dragon dictation. Any transcription errors are unintentional

## 2020-05-09 NOTE — Plan of Care (Signed)
°  Problem: Education: °Goal: Ability to demonstrate management of disease process will improve °Outcome: Progressing °  °Problem: Cardiac: °Goal: Ability to achieve and maintain adequate cardiopulmonary perfusion will improve °Outcome: Progressing °  °Problem: Education: °Goal: Knowledge of General Education information will improve °Description: Including pain rating scale, medication(s)/side effects and non-pharmacologic comfort measures °Outcome: Progressing °  °

## 2020-05-09 NOTE — H&P (View-Only) (Signed)
Progress Note  Patient Name: Katie [name] Date of Encounter: 05/09/2020  Primary Cardiologist: New to [facility] - consult by Fletcher Anon  Subjective   Notes some improvement in her dyspnea though continues to feel "congested."  Sore from coughing.  No frank chest pain.  Does note occasional palpitations.  Documented urine output 5.1 L for the admission.  Weight trend of 92 to 88.5 kg.  Renal function remains elevated though stable.  Inpatient Medications    Scheduled Meds: . amLODipine  10 mg Oral Daily  . carvedilol  12.5 mg Oral BID WC  . furosemide  80 mg Intravenous BID  . heparin  5,000 Units Subcutaneous Q8H  . hydrALAZINE  10 mg Oral TID  . insulin aspart  0-9 Units Subcutaneous TID WC  . polyethylene glycol  17 g Oral Daily  . senna-docusate  1 tablet Oral BID  . sodium bicarbonate  650 mg Oral TID   Continuous Infusions:  PRN Meds: acetaminophen **OR** acetaminophen, ALPRAZolam, bisacodyl, guaiFENesin-dextromethorphan, hydrALAZINE, melatonin, ondansetron **OR** ondansetron (ZOFRAN) IV, oxyCODONE-acetaminophen   Vital Signs    Vitals:   05/08/20 1632 05/08/20 2039 05/09/20 0437 05/09/20 0752  BP: 136/62 137/63 134/65 (!) 157/70  Pulse: 72 68 70 70  Resp: (!) 22 17 17    Temp: 98.9 F (37.2 C) 98.3 F (36.8 C) 98.2 F (36.8 C) 97.6 F (36.4 C)  TempSrc: Oral Oral Oral Oral  SpO2: 98% 99% 97% 98%  Weight:   88.5 kg   Height:        Intake/Output Summary (Last 24 hours) at 05/09/2020 0840 Last data filed at 05/08/2020 1900 Gross per 24 hour  Intake 240 ml  Output 150 ml  Net 90 ml   Filed Weights   05/07/20 0345 05/08/20 0703 05/09/20 0437  Weight: 90.7 kg 92 kg 88.5 kg    Telemetry    Sinus rhythm with short run of SVT lasting 12 beats - Personally Reviewed  ECG    No new tracings - Personally Reviewed  Physical Exam   GEN: No acute distress.   Neck: JVD elevated approximately 10 cm. Cardiac: RRR, II/VI systolic murmur, no rubs, or gallops.   Respiratory:  Diminished breath sounds along the bilateral bases.  GI: Soft, nontender, non-distended.   MS:  1+ bilateral lower extremity pitting edema; No deformity. Neuro:  Alert and oriented x 3; Nonfocal.  Psych: Normal affect.  Labs    Chemistry Recent Labs  Lab 05/02/20 1100 05/03/20 0408 05/07/20 0553 05/08/20 0518 05/09/20 0503  NA  --    < > 139 140 140  K  --    < > 4.8 4.5 4.9  CL  --    < > 109 110 110  CO2  --    < > 22 22 24   GLUCOSE  --    < > 91 79 102*  BUN  --    < > 90* 97* 94*  CREATININE  --    < > 3.32* 3.50* 3.47*  CALCIUM  --    < > 7.8* 7.9* 7.9*  PROT 5.6*  --   --   --   --   ALBUMIN  --    < > 2.2* 2.2* 2.2*  GFRNONAA  --    < > 15* 14* 14*  ANIONGAP  --    < > 8 8 6    < > = values in this interval not displayed.     Hematology Recent Labs  Lab 05/05/20  0715  WBC 6.3  RBC 3.10*  HGB 9.7*  HCT 30.0*  MCV 96.8  MCH 31.3  MCHC 32.3  RDW 13.6  PLT 137*    Cardiac EnzymesNo results for input(s): TROPONINI in the last 168 hours. No results for input(s): TROPIPOC in the last 168 hours.   BNPNo results for input(s): BNP, PROBNP in the last 168 hours.   DDimer No results for input(s): DDIMER in the last 168 hours.   Radiology    DG Chest 2 View  Result Date: 05/08/2020 IMPRESSION: Cardiomegaly with bibasilar collapse/consolidative opacity and small to moderate bilateral pleural effusions. Interval increase in right pleural fluid volume. Electronically Signed   By: Misty Stanley M.D.   On: 05/08/2020 07:53    Cardiac Studies   2D echo 05/01/2020: 1. Left ventricular ejection fraction, by estimation, is 55 to 60%. The  left ventricle has normal function. The left ventricle has no regional  wall motion abnormalities. There is severe concentric left ventricular  hypertrophy. Left ventricular diastolic  parameters are consistent with Grade II diastolic dysfunction  (pseudonormalization).  2. Right ventricular systolic function  is moderately reduced. The right  ventricular size is normal. Mildly increased right ventricular wall  thickness. There is moderately elevated pulmonary artery systolic  pressure.  3. Left atrial size was mildly dilated.  4. Right atrial size was mildly dilated.  5. Moderate pericardial effusion. The pericardial effusion is  circumferential.  6. The mitral valve is abnormal. Mild to moderate mitral valve  regurgitation.  7. The tricuspid valve is abnormal. Tricuspid valve regurgitation is mild  to moderate.  8. The aortic valve is abnormal. Aortic valve regurgitation is mild to  moderate. Mild to moderate aortic valve stenosis.  Patient Profile     64 y.o. female with history of HFpEF, CKD stage IV, and HTN who presented with increasing shortness of breath and edema who we are seeing for acute on chronic HFpEF and pulmonary hypertension.  Assessment & Plan    1.  Acute on chronic HFpEF/pulmonary hypertension/pleural and pericardial effusions: -Dyspnea is improving though does persist -She continues to appear volume up -Status post right-sided thoracentesis on 05/02/2020 with 1.6 L of fluid removed with cytology being negative for evidence of malignancy with pleural effusion consistent with transudate -Rechallenge with IV Lasix 80 mg twice daily for today -N.p.o. at midnight for potential RHC on 05/10/2020 to further assess her fluid status -Risks and benefits of right heart cardiac catheterization have been discussed with the patient including risks of bleeding, bruising, infection, stroke, heart attack, and death. The patient understands these risks and is willing to proceed with the procedure. All questions have been answered and concerns listened to  2.  HTN: -Overall, BP has been reasonably controlled -IV Lasix as above -Amlodipine, carvedilol, hydralazine  3.  Mitral valve regurgitation/aortic valve insufficiency/aortic valve stenosis: -Valvular heart disease mild to  moderate on echo this admission -Follow-up as outpatient  4.  Acute on CKD stage IV: -Nephrology following -Close monitoring of renal function with rechallenge of IV Lasix as above  For questions or updates, please contact [place] Please consult [email or web address] for contact info under Cardiology/STEMI.    Signed, Christell Faith, PA-C [place] Pager: [phone] 05/09/2020, 8:40 AM

## 2020-05-09 NOTE — Progress Notes (Signed)
Progress Note  Patient Name: Katie [name] Date of Encounter: 05/09/2020  Primary Cardiologist: New to [facility] - consult by Fletcher Anon  Subjective   Notes some improvement in her dyspnea though continues to feel "congested."  Sore from coughing.  No frank chest pain.  Does note occasional palpitations.  Documented urine output 5.1 L for the admission.  Weight trend of 92 to 88.5 kg.  Renal function remains elevated though stable.  Inpatient Medications    Scheduled Meds: . amLODipine  10 mg Oral Daily  . carvedilol  12.5 mg Oral BID WC  . furosemide  80 mg Intravenous BID  . heparin  5,000 Units Subcutaneous Q8H  . hydrALAZINE  10 mg Oral TID  . insulin aspart  0-9 Units Subcutaneous TID WC  . polyethylene glycol  17 g Oral Daily  . senna-docusate  1 tablet Oral BID  . sodium bicarbonate  650 mg Oral TID   Continuous Infusions:  PRN Meds: acetaminophen **OR** acetaminophen, ALPRAZolam, bisacodyl, guaiFENesin-dextromethorphan, hydrALAZINE, melatonin, ondansetron **OR** ondansetron (ZOFRAN) IV, oxyCODONE-acetaminophen   Vital Signs    Vitals:   05/08/20 1632 05/08/20 2039 05/09/20 0437 05/09/20 0752  BP: 136/62 137/63 134/65 (!) 157/70  Pulse: 72 68 70 70  Resp: (!) 22 17 17    Temp: 98.9 F (37.2 C) 98.3 F (36.8 C) 98.2 F (36.8 C) 97.6 F (36.4 C)  TempSrc: Oral Oral Oral Oral  SpO2: 98% 99% 97% 98%  Weight:   88.5 kg   Height:        Intake/Output Summary (Last 24 hours) at 05/09/2020 0840 Last data filed at 05/08/2020 1900 Gross per 24 hour  Intake 240 ml  Output 150 ml  Net 90 ml   Filed Weights   05/07/20 0345 05/08/20 0703 05/09/20 0437  Weight: 90.7 kg 92 kg 88.5 kg    Telemetry    Sinus rhythm with short run of SVT lasting 12 beats - Personally Reviewed  ECG    No new tracings - Personally Reviewed  Physical Exam   GEN: No acute distress.   Neck: JVD elevated approximately 10 cm. Cardiac: RRR, II/VI systolic murmur, no rubs, or gallops.   Respiratory:  Diminished breath sounds along the bilateral bases.  GI: Soft, nontender, non-distended.   MS:  1+ bilateral lower extremity pitting edema; No deformity. Neuro:  Alert and oriented x 3; Nonfocal.  Psych: Normal affect.  Labs    Chemistry Recent Labs  Lab 05/02/20 1100 05/03/20 0408 05/07/20 0553 05/08/20 0518 05/09/20 0503  NA  --    < > 139 140 140  K  --    < > 4.8 4.5 4.9  CL  --    < > 109 110 110  CO2  --    < > 22 22 24   GLUCOSE  --    < > 91 79 102*  BUN  --    < > 90* 97* 94*  CREATININE  --    < > 3.32* 3.50* 3.47*  CALCIUM  --    < > 7.8* 7.9* 7.9*  PROT 5.6*  --   --   --   --   ALBUMIN  --    < > 2.2* 2.2* 2.2*  GFRNONAA  --    < > 15* 14* 14*  ANIONGAP  --    < > 8 8 6    < > = values in this interval not displayed.     Hematology Recent Labs  Lab 05/05/20  0715  WBC 6.3  RBC 3.10*  HGB 9.7*  HCT 30.0*  MCV 96.8  MCH 31.3  MCHC 32.3  RDW 13.6  PLT 137*    Cardiac EnzymesNo results for input(s): TROPONINI in the last 168 hours. No results for input(s): TROPIPOC in the last 168 hours.   BNPNo results for input(s): BNP, PROBNP in the last 168 hours.   DDimer No results for input(s): DDIMER in the last 168 hours.   Radiology    DG Chest 2 View  Result Date: 05/08/2020 IMPRESSION: Cardiomegaly with bibasilar collapse/consolidative opacity and small to moderate bilateral pleural effusions. Interval increase in right pleural fluid volume. Electronically Signed   By: Misty Stanley M.D.   On: 05/08/2020 07:53    Cardiac Studies   2D echo 05/01/2020: 1. Left ventricular ejection fraction, by estimation, is 55 to 60%. The  left ventricle has normal function. The left ventricle has no regional  wall motion abnormalities. There is severe concentric left ventricular  hypertrophy. Left ventricular diastolic  parameters are consistent with Grade II diastolic dysfunction  (pseudonormalization).  2. Right ventricular systolic function  is moderately reduced. The right  ventricular size is normal. Mildly increased right ventricular wall  thickness. There is moderately elevated pulmonary artery systolic  pressure.  3. Left atrial size was mildly dilated.  4. Right atrial size was mildly dilated.  5. Moderate pericardial effusion. The pericardial effusion is  circumferential.  6. The mitral valve is abnormal. Mild to moderate mitral valve  regurgitation.  7. The tricuspid valve is abnormal. Tricuspid valve regurgitation is mild  to moderate.  8. The aortic valve is abnormal. Aortic valve regurgitation is mild to  moderate. Mild to moderate aortic valve stenosis.  Patient Profile     64 y.o. female with history of HFpEF, CKD stage IV, and HTN who presented with increasing shortness of breath and edema who we are seeing for acute on chronic HFpEF and pulmonary hypertension.  Assessment & Plan    1.  Acute on chronic HFpEF/pulmonary hypertension/pleural and pericardial effusions: -Dyspnea is improving though does persist -She continues to appear volume up -Status post right-sided thoracentesis on 05/02/2020 with 1.6 L of fluid removed with cytology being negative for evidence of malignancy with pleural effusion consistent with transudate -Rechallenge with IV Lasix 80 mg twice daily for today -N.p.o. at midnight for potential RHC on 05/10/2020 to further assess her fluid status -Risks and benefits of right heart cardiac catheterization have been discussed with the patient including risks of bleeding, bruising, infection, stroke, heart attack, and death. The patient understands these risks and is willing to proceed with the procedure. All questions have been answered and concerns listened to  2.  HTN: -Overall, BP has been reasonably controlled -IV Lasix as above -Amlodipine, carvedilol, hydralazine  3.  Mitral valve regurgitation/aortic valve insufficiency/aortic valve stenosis: -Valvular heart disease mild to  moderate on echo this admission -Follow-up as outpatient  4.  Acute on CKD stage IV: -Nephrology following -Close monitoring of renal function with rechallenge of IV Lasix as above  For questions or updates, please contact [place] Please consult [email or web address] for contact info under Cardiology/STEMI.    Signed, Christell Faith, PA-C [place] Pager: [phone] 05/09/2020, 8:40 AM

## 2020-05-09 NOTE — Progress Notes (Signed)
PT Cancellation Note  Patient Details Name: Katie [name] MRN: [id number] DOB: July 17, 1955   Cancelled Treatment:     Pt remains in fluid overload with possible need for right heart catheterization tomorrow.  Will continue to monitor for continued PT services if medically stable.    Josie Dixon 05/09/2020, 2:41 PM

## 2020-05-10 ENCOUNTER — Encounter
Admission: EM | Disposition: A | Discharge status: Home / Self Care | Payer: Self-pay | Source: Home / Self Care | Attending: Internal Medicine

## 2020-05-10 ENCOUNTER — Encounter: Payer: Self-pay | Admitting: Internal Medicine

## 2020-05-10 DIAGNOSIS — I272 Pulmonary hypertension, unspecified: Secondary | ICD-10-CM | POA: Diagnosis present

## 2020-05-10 HISTORY — PX: RIGHT HEART CATH: CATH118263

## 2020-05-10 LAB — CBC
HCT: 28.1 % — ABNORMAL LOW (ref 36.0–46.0)
Hemoglobin: 8.8 g/dL — ABNORMAL LOW (ref 12.0–15.0)
MCH: 31.2 pg (ref 26.0–34.0)
MCHC: 31.3 g/dL (ref 30.0–36.0)
MCV: 99.6 fL (ref 80.0–100.0)
Platelets: 175 10*3/uL (ref 150–400)
RBC: 2.82 MIL/uL — ABNORMAL LOW (ref 3.87–5.11)
RDW: 13.3 % (ref 11.5–15.5)
WBC: 5.2 10*3/uL (ref 4.0–10.5)
nRBC: 0 % (ref 0.0–0.2)

## 2020-05-10 LAB — CREATININE, SERUM
Creatinine, Ser: 3.58 mg/dL — ABNORMAL HIGH (ref 0.44–1.00)
GFR, Estimated: 14 mL/min — ABNORMAL LOW (ref >60)

## 2020-05-10 LAB — GLUCOSE, CAPILLARY
Glucose-Capillary: 88 mg/dL (ref 70–99)
Glucose-Capillary: 104 mg/dL — ABNORMAL HIGH (ref 70–99)
Glucose-Capillary: 104 mg/dL — ABNORMAL HIGH (ref 70–99)
Glucose-Capillary: 137 mg/dL — ABNORMAL HIGH (ref 70–99)

## 2020-05-10 LAB — RENAL FUNCTION PANEL
Albumin: 2.3 g/dL — ABNORMAL LOW (ref 3.5–5.0)
Anion gap: 5 (ref 5–15)
BUN: 99 mg/dL — ABNORMAL HIGH (ref 8–23)
CO2: 26 mmol/L (ref 22–32)
Calcium: 8.2 mg/dL — ABNORMAL LOW (ref 8.9–10.3)
Chloride: 110 mmol/L (ref 98–111)
Creatinine, Ser: 3.69 mg/dL — ABNORMAL HIGH (ref 0.44–1.00)
GFR, Estimated: 13 mL/min — ABNORMAL LOW (ref >60)
Glucose, Bld: 86 mg/dL (ref 70–99)
Phosphorus: 7.1 mg/dL — ABNORMAL HIGH (ref 2.5–4.6)
Potassium: 5.5 mmol/L — ABNORMAL HIGH (ref 3.5–5.1)
Sodium: 141 mmol/L (ref 135–145)

## 2020-05-10 LAB — MAGNESIUM: Magnesium: 2 mg/dL (ref 1.7–2.4)

## 2020-05-10 SURGERY — RIGHT HEART CATH
Anesthesia: Moderate Sedation

## 2020-05-10 MED ORDER — SODIUM CHLORIDE 0.9% FLUSH
3.0000 mL | INTRAVENOUS | Status: DC | PRN
  Administered 2020-06-03 – 2020-06-16 (×3): 3 mL via INTRAVENOUS

## 2020-05-10 MED ORDER — SODIUM CHLORIDE 0.9 % IV SOLN: 250.0000 mL | INTRAVENOUS | Status: DC | PRN

## 2020-05-10 MED ORDER — LIDOCAINE HCL (PF) 1 % IJ SOLN
INTRAMUSCULAR | Status: DC | PRN
  Administered 2020-05-10: 2 mL

## 2020-05-10 MED ORDER — SODIUM CHLORIDE 0.9% FLUSH: 3.0000 mL | INTRAVENOUS | Status: DC | PRN

## 2020-05-10 MED ORDER — HEPARIN SODIUM (PORCINE) 5000 UNIT/ML IJ SOLN
5000.0000 [IU] | Freq: Three times a day (TID) | INTRAMUSCULAR | Status: DC
  Administered 2020-05-10 – 2020-07-02 (×146): 5000 [IU] via SUBCUTANEOUS
  Filled 2020-05-10 (×145): qty 1

## 2020-05-10 MED ORDER — HEPARIN (PORCINE) IN NACL 1000-0.9 UT/500ML-% IV SOLN
INTRAVENOUS | Status: DC | PRN
  Administered 2020-05-10: 500 mL

## 2020-05-10 MED ORDER — SODIUM CHLORIDE 0.9% FLUSH
3.0000 mL | Freq: Two times a day (BID) | INTRAVENOUS | Status: DC
  Administered 2020-05-10 – 2020-07-02 (×97): 3 mL via INTRAVENOUS

## 2020-05-10 MED ORDER — SODIUM CHLORIDE 0.9% FLUSH: 3.0000 mL | Freq: Two times a day (BID) | INTRAVENOUS | Status: DC

## 2020-05-10 MED ORDER — LIDOCAINE HCL (PF) 1 % IJ SOLN
INTRAMUSCULAR | Status: AC
  Filled 2020-05-10: qty 30

## 2020-05-10 MED ORDER — LABETALOL HCL 5 MG/ML IV SOLN: 10.0000 mg | INTRAVENOUS | Status: AC | PRN

## 2020-05-10 MED ORDER — HYDRALAZINE HCL 20 MG/ML IJ SOLN: 10.0000 mg | INTRAMUSCULAR | Status: AC | PRN

## 2020-05-10 SURGICAL SUPPLY — 4 items
CATH BALLN WEDGE 5F 110CM (CATHETERS) ×1 IMPLANT
KIT MANI 3VAL PERCEP (MISCELLANEOUS) ×2 IMPLANT
PACK CARDIAC CATH (CUSTOM PROCEDURE TRAY) ×2 IMPLANT
SHEATH GLIDE SLENDER 4/5FR (SHEATH) ×1 IMPLANT

## 2020-05-10 NOTE — Plan of Care (Signed)
°  Problem: Education: Goal: Ability to demonstrate management of disease process will improve Outcome: Progressing   Problem: Activity: Goal: Capacity to carry out activities will improve Outcome: Not Progressing Note: Encourage patient to sit on side of bed for meals and or in the chair

## 2020-05-10 NOTE — Progress Notes (Signed)
PT Cancellation Note  Patient Details Name: Katie [name] MRN: [id number] DOB: 1955-12-07   Cancelled Treatment:     Treatment held due to pt receiving right heart catheterization today.    Josie Dixon 05/10/2020, 2:46 PM

## 2020-05-10 NOTE — Interval H&P Note (Signed)
History and Physical Interval Note:  05/10/2020 11:05 AM  Katie [name]  has presented today for surgery, with the diagnosis of acute HFpEF and pulmonary hypertension.  The various methods of treatment have been discussed with the patient and family. After consideration of risks, benefits and other options for treatment, the patient has consented to  Procedure(s): RIGHT HEART CATH (N/A) as a surgical intervention.  The patient's history has been reviewed, patient examined, no change in status, stable for surgery.  I have reviewed the patient's chart and labs.  Questions were answered to the patient's satisfaction.     Tayon Parekh

## 2020-05-10 NOTE — Progress Notes (Signed)
Unable to obtain ReDs vest reading due to machine not working. Will continue to monitor.

## 2020-05-10 NOTE — Progress Notes (Signed)
PROGRESS NOTE    Katie [name]  SNK:[id number] DOB: 03-21-1956 DOA: 04/30/2020 PCP: Patient, [name]   Brief Narrative:   Katie [name] is a 64 y.o. female with a history of diabetes mellitus type 2, stage IV CKD, heart failure, hypertension. Patient presented secondary to dyspnea with evidence of volume overload and pleural effusion.  12/22: Patient still edematous.  Net -3 L since admission.  Creatinine worsening.  Seen by cardiology nephrology this morning.  12/23: Patient remains fluid overloaded with 2+ pitting edema bilateral lower extremities.  Concern regarding worsening renal function is limiting effective diuresis.  Patient net -3.7 L since admission.  12/24: Patient seen and examined.  Lower extremities remain edematous.  Patient net -5.4 L since admission.  [name] function worsening.  12/25: Lower extremity edema improved.  Continues to diurese well.  [name] function worsening.  12/27: Remains edematous.  Seen by cardiology.  Recommending increase Lasix dose.  Patient continues to not respond anticipate right heart catheterization during this hospitalization.  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 Demadex was initiated with few days ago due to worsening [name] function 12/26: [name] function continues to worsen. 24-hour urine collection in progress 12/27: Still with fluid overload.  Cardiology  adjusted diuretic dosing  12/28: Plan for right heart cath Plan: Lasix on hold Right heart catheterization today Monitor [name] function Monitor and replace electrolytes Continue Coreg and hydralazine No ACE inhibitor Strict I's and O's Daily weights Cardiology nephrology following  Pericardial effusion Moderate size on echo No indication of tamponade Repeat limited echo in a few weeks  Pleural effusion Status post ultrasound-guided thoracentesis 05/02/2020 Suspect transudate of process secondary to decompensated heart failure No evidence of hemorrhagic or infected effusion  Diabetes mellitus, type 2 Hemoglobin A1C of 4.7%. No need for pharmacologic treatment. Diet control.  Acute [name] injury on chronic [name] disease stage IV Unknown baseline.  Patient is unsure of her primary nephrologist in [place].  Creatinine of 2.97 on admission.  Per patient, she was planned to start hemodialysis in addition to consideration for renal transplant.  Nephrology consulted on 12/20 12/25: [name] function worsening 12/27: Status post 24 urine collection 12/28: Creatinine continues to worsen Plan: Lasix on hold for right heart cath Avoid nonessential nephrotoxins Daily renal function  Mild/moderate aortic stenosis Unsure if this is symptomatic.  Patient does have dyspnea with exertion but this may be multifactorial. -Cardiology recommendations: outpatient Transthoracic Echocardiogram   Essential hypertension Patient is on at least hydrochlorothiazide as an outpatient but is unsure of her regimen. Improved control on current regimen -Amlodipine 10 mg daily; continue diuresis -Cardiology added Coreg.  Dose increased to 12.5 mg twice daily -Continue hydralazine p.o..  Dose increased to 50 mg 3 times daily -Continue hydralazine 10 mg q6 hours prn -Uptitrate antihypertensive regimen as necessary  Anemia Possible related to underlying [name] disease.  Evidence of macrocytosis on  CBC.  TIBC low but otherwise  normal iron panel.  Vitamin B12 and folate normal.  Hyperkalemia In setting of CKD.  Lokelma 10 g given with improvement.  Pressure injury Mid coccyx, POA  History of tobacco use Patient has quit for at least 10 years.  Previously, 20 pack year history.  Headache Normal chronic headache. Patient states she takes Imitrex -Imitrex  Obesity Body mass index is 31.92 kg/m.   DVT prophylaxis: SQ heparin Code Status: Full Family Communication: Left VM for friend Katie [name] [phone] on 05/05/2020 disposition Plan: Status is: Inpatient  Remains inpatient appropriate because:Inpatient level of care appropriate due to severity of illness   Dispo: The patient is from: Home              Anticipated d/c is to: SNF              Anticipated d/c date is: 2 days              Patient currently is not medically stable to d/c.   Fluid overload persistent despite aggressive diuresis.  [name] function worsening.  Right heart catheterization today.  Disposition plan unclear.         Consultants:   Cardiology-[facility]  Nephrology-[facility] [place] [name]  Procedures:   Ultrasound-guided thoracentesis, 05/02/2020  Antimicrobials:   None   Subjective: Patient seen and examined.  Reports that leg still feel heavy.  No pain complaints  Objective: Vitals:   05/10/20 0807 05/10/20 1135 05/10/20 1145 05/10/20 1154  BP: (!) 133/58 124/62 124/63 118/60  Pulse: 67 63 62 61  Resp: 19 17 18 15   Temp: 98.2 F (36.8 C)     TempSrc: Oral     SpO2: 99% 97% 98% 98%  Weight:      Height:        Intake/Output Summary (Last 24 hours) at 05/10/2020 1351 Last data filed at 05/10/2020 0500 Gross per 24 hour  Intake 600 ml  Output 1700 ml  Net -1100 ml   Filed Weights   05/08/20 0703 05/09/20 0437 05/10/20 0027  Weight: 92 kg 88.5 kg 91.3 kg    Examination:  General exam: Appears calm and comfortable  Respiratory system: Bibasilar  crackles.  Normal work of breathing.  Room air Cardiovascular system: S1-S2 heard, regular rate and rhythm, heart sounds distant, 2+ pitting edema bilaterally gastrointestinal system: Abdomen is nondistended, soft and nontender. No organomegaly or masses felt. Normal bowel sounds heard. Central nervous system: Alert and oriented. No focal neurological deficits. Extremities: Symmetric 5 x 5 power. Skin: No rashes, lesions or ulcers Psychiatry: Judgement and insight appear normal. Mood & affect appropriate.     Data Reviewed: I have personally reviewed following labs and imaging studies  CBC: Recent Labs  Lab 05/05/20 0715 05/10/20 1224  WBC 6.3 5.2  HGB 9.7* 8.8*  HCT 30.0* 28.1*  MCV 96.8 99.6  PLT 137* 539   Basic Metabolic Panel: Recent Labs  Lab 05/06/20 0556 05/07/20 0553 05/08/20 0518 05/09/20 0503 05/10/20 0516 05/10/20 1224  NA 135 139 140 140 141  --   K 4.2 4.8 4.5 4.9 5.5*  --   CL 105 109 110 110 110  --   CO2 22 22 22 24 26   --   GLUCOSE 107* 91 79 102* 86  --   BUN 85* 90* 97* 94* 99*  --   CREATININE 3.13* 3.32* 3.50* 3.47* 3.69* 3.58*  CALCIUM 7.4* 7.8* 7.9* 7.9* 8.2*  --   MG 1.9 1.9 1.9 1.9 2.0  --  PHOS 5.2* 6.2* 6.5* 7.0* 7.1*  --    GFR: Estimated Creatinine Clearance: 18.5 mL/min (A) (by C-G formula based on SCr of 3.58 mg/dL (H)). Liver Function Tests: Recent Labs  Lab 05/06/20 0556 05/07/20 0553 05/08/20 0518 05/09/20 0503 05/10/20 0516  ALBUMIN 2.3* 2.2* 2.2* 2.2* 2.3*   No results for input(s): LIPASE, AMYLASE in the last 168 hours. No results for input(s): AMMONIA in the last 168 hours. Coagulation Profile: No results for input(s): INR, PROTIME in the last 168 hours. Cardiac Enzymes: No results for input(s): CKTOTAL, CKMB, CKMBINDEX, TROPONINI in the last 168 hours. BNP (last 3 results) No results for input(s): PROBNP in the last 8760 hours. HbA1C: No results for input(s): HGBA1C in the last 72 hours. CBG: Recent Labs  Lab  05/09/20 1137 05/09/20 1650 05/09/20 2048 05/10/20 0808 05/10/20 1157  GLUCAP 124* 107* 102* 88 104*   Lipid Profile: No results for input(s): CHOL, HDL, LDLCALC, TRIG, CHOLHDL, LDLDIRECT in the last 72 hours. Thyroid Function Tests: No results for input(s): TSH, T4TOTAL, FREET4, T3FREE, THYROIDAB in the last 72 hours. Anemia Panel: No results for input(s): VITAMINB12, FOLATE, FERRITIN, TIBC, IRON, RETICCTPCT in the last 72 hours. Sepsis Labs: No results for input(s): PROCALCITON, LATICACIDVEN in the last 168 hours.  Recent Results (from the past 240 hour(s))  Resp Panel by RT-PCR (Flu A&B, Covid) Nasopharyngeal Swab     Status: None   Collection Time: 04/30/20  4:01 PM   Specimen: Nasopharyngeal Swab; Nasopharyngeal(NP) swabs in vial transport medium  Result Value Ref Range Status   SARS Coronavirus 2 by RT PCR NEGATIVE NEGATIVE Final    Comment: (NOTE) SARS-CoV-2 target nucleic acids are NOT DETECTED.  The SARS-CoV-2 RNA is generally detectable in upper respiratory specimens during the acute phase of infection. The lowest concentration of SARS-CoV-2 viral copies this assay can detect is 138 copies/mL. A negative result does not preclude SARS-Cov-2 infection and should not be used as the sole basis for treatment or other patient management decisions. A negative result may occur with  improper specimen collection/handling, submission of specimen other than nasopharyngeal swab, presence of viral mutation(s) within the areas targeted by this assay, and inadequate number of viral copies(<138 copies/mL). A negative result must be combined with clinical observations, patient history, and epidemiological information. The expected result is Negative.  Fact Sheet for Patients:  EntrepreneurPulse.com.au  Fact Sheet for Healthcare Providers:  IncredibleEmployment.be  This test is no t yet approved or cleared by the [place] FDA and  has been  authorized for detection and/or diagnosis of SARS-CoV-2 by FDA under an Emergency Use Authorization (EUA). This EUA will remain  in effect (meaning this test can be used) for the duration of the COVID-19 declaration under Section 564(b)(1) of the Act, 21 U.S.C.section 360bbb-3(b)(1), unless the authorization is terminated  or revoked sooner.       Influenza A by PCR NEGATIVE NEGATIVE Final   Influenza B by PCR NEGATIVE NEGATIVE Final    Comment: (NOTE) The Xpert Xpress SARS-CoV-2/FLU/RSV plus assay is intended as an aid in the diagnosis of influenza from Nasopharyngeal swab specimens and should not be used as a sole basis for treatment. Nasal washings and aspirates are unacceptable for Xpert Xpress SARS-CoV-2/FLU/RSV testing.  Fact Sheet for Patients: EntrepreneurPulse.com.au  Fact Sheet for Healthcare Providers: IncredibleEmployment.be  This test is not yet approved or cleared by the [place] FDA and has been authorized for detection and/or diagnosis of SARS-CoV-2 by FDA under an Emergency Use Authorization (EUA).  This EUA will remain in effect (meaning this test can be used) for the duration of the COVID-19 declaration under Section 564(b)(1) of the Act, 21 U.S.C. section 360bbb-3(b)(1), unless the authorization is terminated or revoked.  Performed at [facility], [address] [place], [place] [address]   Body fluid culture     Status: None   Collection Time: 05/02/20  4:44 PM   Specimen: PATH Cytology Pleural fluid  Result Value Ref Range Status   Specimen Description   Final    PLEURAL Performed at [facility], [address], [place], [place] [address]    Special Requests   Final    NONE Performed at [facility], [address] [place], [place] [address]    Gram Stain   Final    MODERATE WBC PRESENT, PREDOMINANTLY MONONUCLEAR NO ORGANISMS SEEN    Culture   Final    NO  GROWTH Performed at [facility], [place] [address], [place], [place] [address]    Report Status 05/06/2020 FINAL  Final         Radiology Studies: CARDIAC CATHETERIZATION  Result Date: 05/10/2020 Conclusions: 1. Mildly elevated left heart, right heart, and pulmonary artery pressures. 2. Normal to supranormal Fick cardiac output/index. Recommendations: 1. Continue gentle diuresis. 2. Consider further workup of potential causes of high-output heart failure. Nelva Bush, MD [facility]        Scheduled Meds: . amLODipine  10 mg Oral Daily  . carvedilol  12.5 mg Oral BID WC  . heparin  5,000 Units Subcutaneous Q8H  . insulin aspart  0-9 Units Subcutaneous TID WC  . polyethylene glycol  17 g Oral Daily  . senna-docusate  1 tablet Oral BID  . sodium bicarbonate  650 mg Oral TID  . sodium chloride flush  10 mL Intravenous Q12H  . sodium chloride flush  3 mL Intravenous Q12H   Continuous Infusions: . sodium chloride       LOS: 9 days    Time spent: 25 minutes    Sidney Ace, MD [facility] Pager [phone]  If 7PM-7AM, please contact night-coverage 05/10/2020, 1:51 PM

## 2020-05-10 NOTE — Progress Notes (Signed)
Progress Note  Patient Name: Katie [name] Date of Encounter: 05/10/2020  Primary Cardiologist: New to [facility] - consult by Fletcher Anon  Subjective   Notes some continued improvement in her dyspnea. Chest sore from coughing and with deep inhalation.  Documented urine output of 680 mL for the past 24 hours and a net negative 5.8 L for the admission.  Weight trend of 88.5 to 91.3 kg. Slight bump in renal function with resumption of IV Lasix.   Inpatient Medications    Scheduled Meds: . amLODipine  10 mg Oral Daily  . carvedilol  12.5 mg Oral BID WC  . furosemide  80 mg Intravenous BID  . heparin  5,000 Units Subcutaneous Q8H  . insulin aspart  0-9 Units Subcutaneous TID WC  . polyethylene glycol  17 g Oral Daily  . senna-docusate  1 tablet Oral BID  . sodium bicarbonate  650 mg Oral TID  . sodium chloride flush  10 mL Intravenous Q12H  . sodium chloride flush  3 mL Intravenous Q12H   Continuous Infusions: . sodium chloride    . sodium chloride 10 mL/hr at 05/10/20 0609   PRN Meds: sodium chloride, acetaminophen **OR** acetaminophen, ALPRAZolam, bisacodyl, guaiFENesin-dextromethorphan, melatonin, ondansetron **OR** ondansetron (ZOFRAN) IV, oxyCODONE-acetaminophen, sodium chloride flush   Vital Signs    Vitals:   05/09/20 2019 05/10/20 0027 05/10/20 0445 05/10/20 0807  BP: (!) 145/67  (!) 142/73 (!) 133/58  Pulse: 68  69 67  Resp: 17  18 19   Temp: 98.2 F (36.8 C)  98.1 F (36.7 C) 98.2 F (36.8 C)  TempSrc: Oral  Oral Oral  SpO2: 99%  99% 99%  Weight:  91.3 kg    Height:        Intake/Output Summary (Last 24 hours) at 05/10/2020 0852 Last data filed at 05/10/2020 0500 Gross per 24 hour  Intake 1320 ml  Output 2000 ml  Net -680 ml   Filed Weights   05/08/20 0703 05/09/20 0437 05/10/20 0027  Weight: 92 kg 88.5 kg 91.3 kg    Telemetry    Sinus rhythm, 60s bpm - Personally Reviewed  ECG    No new tracings - Personally Reviewed  Physical Exam   GEN: No acute  distress.   Neck: JVD elevated approximately 10 cm. Cardiac: RRR, II/VI systolic murmur, no rubs, or gallops.  Respiratory:  Diminished breath sounds along the bilateral bases.  GI: Soft, nontender, non-distended.   MS:  Trace bilateral pretibial edema; No deformity. Neuro:  Alert and oriented x 3; Nonfocal.  Psych: Normal affect.  Labs    Chemistry Recent Labs  Lab 05/08/20 0518 05/09/20 0503 05/10/20 0516  NA 140 140 141  K 4.5 4.9 5.5*  CL 110 110 110  CO2 22 24 26   GLUCOSE 79 102* 86  BUN 97* 94* 99*  CREATININE 3.50* 3.47* 3.69*  CALCIUM 7.9* 7.9* 8.2*  ALBUMIN 2.2* 2.2* 2.3*  GFRNONAA 14* 14* 13*  ANIONGAP 8 6 5      Hematology Recent Labs  Lab 05/05/20 0715  WBC 6.3  RBC 3.10*  HGB 9.7*  HCT 30.0*  MCV 96.8  MCH 31.3  MCHC 32.3  RDW 13.6  PLT 137*    Cardiac EnzymesNo results for input(s): TROPONINI in the last 168 hours. No results for input(s): TROPIPOC in the last 168 hours.   BNPNo results for input(s): BNP, PROBNP in the last 168 hours.   DDimer No results for input(s): DDIMER in the last 168 hours.  Radiology    DG Chest 2 View  Result Date: 05/08/2020 IMPRESSION: Cardiomegaly with bibasilar collapse/consolidative opacity and small to moderate bilateral pleural effusions. Interval increase in right pleural fluid volume. Electronically Signed   By: Misty Stanley M.D.   On: 05/08/2020 07:53    Cardiac Studies   2D echo 05/01/2020: 1. Left ventricular ejection fraction, by estimation, is 55 to 60%. The  left ventricle has normal function. The left ventricle has no regional  wall motion abnormalities. There is severe concentric left ventricular  hypertrophy. Left ventricular diastolic  parameters are consistent with Grade II diastolic dysfunction  (pseudonormalization).  2. Right ventricular systolic function is moderately reduced. The right  ventricular size is normal. Mildly increased right ventricular wall  thickness. There is  moderately elevated pulmonary artery systolic  pressure.  3. Left atrial size was mildly dilated.  4. Right atrial size was mildly dilated.  5. Moderate pericardial effusion. The pericardial effusion is  circumferential.  6. The mitral valve is abnormal. Mild to moderate mitral valve  regurgitation.  7. The tricuspid valve is abnormal. Tricuspid valve regurgitation is mild  to moderate.  8. The aortic valve is abnormal. Aortic valve regurgitation is mild to  moderate. Mild to moderate aortic valve stenosis.  Patient Profile     64 y.o. female with history of HFpEF, CKD stage IV, and HTN who presented with increasing shortness of breath and edema who we are seeing for acute on chronic HFpEF and pulmonary hypertension.  Assessment & Plan    1.  Acute on chronic HFpEF/pulmonary hypertension/pleural and pericardial effusions: -Dyspnea is improving though does persist -She continues to appear volume up -Status post right-sided thoracentesis on 05/02/2020 with 1.6 L of fluid removed with cytology being negative for evidence of malignancy with pleural effusion consistent with transudate -Plan for RHC later today to help gauge diuresis -Clear liquid diet okay -IV Lasix 80 mg twice daily -Risks and benefits of right heart cardiac catheterization have been discussed with the patient including risks of bleeding, bruising, infection, stroke, heart attack, and death. The patient understands these risks and is willing to proceed with the procedure. All questions have been answered and concerns listened to  2.  HTN: -Overall, BP has been reasonably controlled -IV Lasix as above -Amlodipine, carvedilol, hydralazine  3.  Mitral valve regurgitation/aortic valve insufficiency/aortic valve stenosis: -Valvular heart disease mild to moderate on echo this admission -Follow-up as outpatient  4.  Acute on CKD stage IV: -Nephrology following, may ultimately need dialysis -Close monitoring of renal  function with rechallenge of IV Lasix as above  For questions or updates, please contact [place] Please consult [email or web address] for contact info under Cardiology/STEMI.    Signed, Christell [place], PA-C [place] Pager: [phone] 05/10/2020, 8:52 AM

## 2020-05-10 NOTE — Progress Notes (Signed)
[place], [name] 05/10/20  Subjective:   Hospital day # 9 Renal function slightly worse. BUN up to 9 9 with a creatinine of 3.69.  Good urine output noted at 2 L.  Renal: 12/27 0701 - 12/28 0700 In: 1320 [P.O.:1320] Out: 2000 [Urine:2000] Lab Results  Component Value Date   CREATININE 3.69 (H) 05/10/2020   CREATININE 3.47 (H) 05/09/2020   CREATININE 3.50 (H) 05/08/2020     Objective:  Vital signs in last 24 hours:  Temp:  [98.1 F (36.7 C)-98.2 F (36.8 C)] 98.2 F (36.8 C) (12/28 0807) Pulse Rate:  [67-70] 67 (12/28 0807) Resp:  [17-19] 19 (12/28 0807) BP: (124-145)/(54-73) 133/58 (12/28 0807) SpO2:  [98 %-99 %] 99 % (12/28 0807) Weight:  [91.3 kg] 91.3 kg (12/28 0027)  Weight change: -0.771 kg Filed Weights   05/08/20 0703 05/09/20 0437 05/10/20 0027  Weight: 92 kg 88.5 kg 91.3 kg    Intake/Output:    Intake/Output Summary (Last 24 hours) at 05/10/2020 1051 Last data filed at 05/10/2020 0500 Gross per 24 hour  Intake 840 ml  Output 1700 ml  Net -860 ml     Physical Exam: General:  Chronically ill-appearing, laying in the bed  HEENT  anicteric, moist oral mucous membranes  Pulm/lungs  normal breathing effort  CVS/Heart  regular, no rub  Abdomen:   Soft, nontender  Extremities: + Dependent edema  Neurologic:  Alert, able to answer questions  Skin:  No acute rashes    Basic Metabolic Panel:  Recent Labs  Lab 05/06/20 0556 05/07/20 0553 05/08/20 0518 05/09/20 0503 05/10/20 0516  NA 135 139 140 140 141  K 4.2 4.8 4.5 4.9 5.5*  CL 105 109 110 110 110  CO2 $Re'22 22 22 24 26  'GID$ GLUCOSE 107* 91 79 102* 86  BUN 85* 90* 97* 94* 99*  CREATININE 3.13* 3.32* 3.50* 3.47* 3.69*  CALCIUM 7.4* 7.8* 7.9* 7.9* 8.2*  MG 1.9 1.9 1.9 1.9 2.0  PHOS 5.2* 6.2* 6.5* 7.0* 7.1*     CBC: Recent Labs  Lab 05/05/20 0715  WBC 6.3  HGB 9.7*  HCT 30.0*  MCV 96.8  PLT 137*     No results found for: HEPBSAG, HEPBSAB,  HEPBIGM    Microbiology:  Recent Results (from the past 240 hour(s))  Resp Panel by RT-PCR (Flu A&B, Covid) Nasopharyngeal Swab     Status: None   Collection Time: 04/30/20  4:01 PM   Specimen: Nasopharyngeal Swab; Nasopharyngeal(NP) swabs in vial transport medium  Result Value Ref Range Status   SARS Coronavirus 2 by RT PCR NEGATIVE NEGATIVE Final    Comment: (NOTE) SARS-CoV-2 target nucleic acids are NOT DETECTED.  The SARS-CoV-2 RNA is generally detectable in upper respiratory specimens during the acute phase of infection. The lowest concentration of SARS-CoV-2 viral copies this assay can detect is 138 copies/mL. A negative result does not preclude SARS-Cov-2 infection and should not be used as the sole basis for treatment or other patient management decisions. A negative result may occur with  improper specimen collection/handling, submission of specimen other than nasopharyngeal swab, presence of viral mutation(s) within the areas targeted by this assay, and inadequate number of viral copies(<138 copies/mL). A negative result must be combined with clinical observations, patient history, and epidemiological information. The expected result is Negative.  Fact Sheet for Patients:  EntrepreneurPulse.com.au  Fact Sheet for Healthcare Providers:  IncredibleEmployment.be  This test is no t yet approved or cleared by the [place] FDA  and  has been authorized for detection and/or diagnosis of SARS-CoV-2 by FDA under an Emergency Use Authorization (EUA). This EUA will remain  in effect (meaning this test can be used) for the duration of the COVID-19 declaration under Section 564(b)(1) of the Act, 21 U.S.C.section 360bbb-3(b)(1), unless the authorization is terminated  or revoked sooner.       Influenza A by PCR NEGATIVE NEGATIVE Final   Influenza B by PCR NEGATIVE NEGATIVE Final    Comment: (NOTE) The Xpert Xpress SARS-CoV-2/FLU/RSV  plus assay is intended as an aid in the diagnosis of influenza from Nasopharyngeal swab specimens and should not be used as a sole basis for treatment. Nasal washings and aspirates are unacceptable for Xpert Xpress SARS-CoV-2/FLU/RSV testing.  Fact Sheet for Patients: EntrepreneurPulse.com.au  Fact Sheet for Healthcare Providers: IncredibleEmployment.be  This test is not yet approved or cleared by the [place] FDA and has been authorized for detection and/or diagnosis of SARS-CoV-2 by FDA under an Emergency Use Authorization (EUA). This EUA will remain in effect (meaning this test can be used) for the duration of the COVID-19 declaration under Section 564(b)(1) of the Act, 21 U.S.C. section 360bbb-3(b)(1), unless the authorization is terminated or revoked.  Performed at [facility], [address] [place], [place] [address]   Body fluid culture     Status: None   Collection Time: 05/02/20  4:44 PM   Specimen: PATH Cytology Pleural fluid  Result Value Ref Range Status   Specimen Description   Final    PLEURAL Performed at [facility], [address], [place], [place] [address]    Special Requests   Final    NONE Performed at [facility], [address] [place], [place] [address]    Gram Stain   Final    MODERATE WBC PRESENT, PREDOMINANTLY MONONUCLEAR NO ORGANISMS SEEN    Culture   Final    NO GROWTH Performed at [facility], [place] [address], [place], [place] [address]    Report Status 05/06/2020 FINAL  Final    Coagulation Studies: No results for input(s): LABPROT, INR in the last 72 hours.  Urinalysis: No results for input(s): COLORURINE, LABSPEC, PHURINE, GLUCOSEU, HGBUR, BILIRUBINUR, KETONESUR, PROTEINUR, UROBILINOGEN, NITRITE, LEUKOCYTESUR in the last 72 hours.  Invalid input(s): APPERANCEUR    Imaging: No results found.   Medications:    sodium chloride     sodium  chloride 10 mL/hr at 05/10/20 5053    amLODipine  10 mg Oral Daily   carvedilol  12.5 mg Oral BID WC   furosemide  80 mg Intravenous BID   heparin  5,000 Units Subcutaneous Q8H   insulin aspart  0-9 Units Subcutaneous TID WC   polyethylene glycol  17 g Oral Daily   senna-docusate  1 tablet Oral BID   sodium bicarbonate  650 mg Oral TID   sodium chloride flush  10 mL Intravenous Q12H   sodium chloride flush  3 mL Intravenous Q12H   sodium chloride, acetaminophen **OR** acetaminophen, ALPRAZolam, bisacodyl, guaiFENesin-dextromethorphan, melatonin, ondansetron **OR** ondansetron (ZOFRAN) IV, oxyCODONE-acetaminophen, sodium chloride flush  Assessment/ Plan:  64 y.o. female with heart failure with preserved EF, hypertension, CKD, stroke   admitted on 04/30/2020 for Chronic kidney disease (CKD), stage IV (severe) (HCC) [N18.4] Volume overload [E87.70]  #AKI with chronic kidney disease stage IV   Current creatinine of 3.5, GFR 14 Awaiting records from previous nephrology evaluation in [place].   Renal ultrasound shows echogenic kidneys Hemoglobin A1c 4.7%  May 01, 2020 Underlying CKD likely secondary to hypertension and atherosclerosis Creatinine clearance came back low at 9. Creatinine now 3.69 with a BUN of 99 and EGFR of 13. -No immediate need for initiation of dialysis though we had a long discussion today about the potential for renal placement therapy in the relative near future.  Patient notes agreeable to proceeding when deemed necessary.  #Proteinuria Urine protein to creatinine ratio 5.35 g May 03, 2020 Serological studies May 04, 2020: ANA negative, ANCA negative, kappa lambda ratio 1.6 Will consider ACE inhibitor/ARB once serum creatinine is determined to be stable  #Severe hypertension, lower extremity edema 2D echo with LVEF 55 to 60%, moderately elevated pulmonary artery systolic pressures, moderate pericardial effusion, Mild to moderate aortic  stenosis  currently managed with torsemide as needed Avoid hypotension.  With severe atherosclerosis, target blood pressure may be 094-076 systolic Continue amlodipine, carvedilol.   LOS: 9 Katie [name] 12/28/202110:51 AM  [place], [place]  Note: This note was prepared with Dragon dictation. Any transcription errors are unintentional

## 2020-05-10 NOTE — Progress Notes (Signed)
Pt directly to cath lab for procedure

## 2020-05-11 ENCOUNTER — Inpatient Hospital Stay
Admission: EM | Disposition: A | Discharge status: Home / Self Care | Payer: Self-pay | Source: Home / Self Care | Attending: Internal Medicine

## 2020-05-11 DIAGNOSIS — N184 Chronic kidney disease, stage 4 (severe): Secondary | ICD-10-CM

## 2020-05-11 HISTORY — PX: TEMPORARY DIALYSIS CATHETER: CATH118312

## 2020-05-11 LAB — RENAL FUNCTION PANEL
Albumin: 2.2 g/dL — ABNORMAL LOW (ref 3.5–5.0)
Anion gap: 7 (ref 5–15)
BUN: 96 mg/dL — ABNORMAL HIGH (ref 8–23)
CO2: 24 mmol/L (ref 22–32)
Calcium: 8.3 mg/dL — ABNORMAL LOW (ref 8.9–10.3)
Chloride: 109 mmol/L (ref 98–111)
Creatinine, Ser: 3.44 mg/dL — ABNORMAL HIGH (ref 0.44–1.00)
GFR, Estimated: 14 mL/min — ABNORMAL LOW (ref >60)
Glucose, Bld: 89 mg/dL (ref 70–99)
Phosphorus: 6.6 mg/dL — ABNORMAL HIGH (ref 2.5–4.6)
Potassium: 6.2 mmol/L — ABNORMAL HIGH (ref 3.5–5.1)
Sodium: 140 mmol/L (ref 135–145)

## 2020-05-11 LAB — TSH: TSH: 3.01 u[IU]/mL (ref 0.350–4.500)

## 2020-05-11 LAB — POTASSIUM: Potassium: 5.6 mmol/L — ABNORMAL HIGH (ref 3.5–5.1)

## 2020-05-11 LAB — GLUCOSE, CAPILLARY
Glucose-Capillary: 113 mg/dL — ABNORMAL HIGH (ref 70–99)
Glucose-Capillary: 124 mg/dL — ABNORMAL HIGH (ref 70–99)
Glucose-Capillary: 79 mg/dL (ref 70–99)
Glucose-Capillary: 124 mg/dL — ABNORMAL HIGH (ref 70–99)

## 2020-05-11 LAB — MAGNESIUM: Magnesium: 2.1 mg/dL (ref 1.7–2.4)

## 2020-05-11 SURGERY — TEMPORARY DIALYSIS CATHETER
Anesthesia: LOCAL

## 2020-05-11 MED ORDER — LIDOCAINE HCL (PF) 1 % IJ SOLN
INTRAMUSCULAR | Status: DC | PRN
  Administered 2020-05-11: 10 mL

## 2020-05-11 MED ORDER — CHLORHEXIDINE GLUCONATE CLOTH 2 % EX PADS
6.0000 | MEDICATED_PAD | Freq: Every day | CUTANEOUS | Status: DC
  Administered 2020-05-12 – 2020-05-15 (×3): 6 via TOPICAL
  Administered 2020-05-16: 2 via TOPICAL
  Administered 2020-05-16 – 2020-05-23 (×8): 6 via TOPICAL

## 2020-05-11 MED ORDER — FUROSEMIDE 10 MG/ML IJ SOLN
40.0000 mg | Freq: Two times a day (BID) | INTRAMUSCULAR | Status: DC
  Administered 2020-05-11 – 2020-05-21 (×19): 40 mg via INTRAVENOUS
  Filled 2020-05-11 (×18): qty 4

## 2020-05-11 MED ORDER — MIDAZOLAM HCL 2 MG/ML PO SYRP
ORAL_SOLUTION | ORAL | Status: AC
  Filled 2020-05-11: qty 4

## 2020-05-11 MED ORDER — DEXTROSE 50 % IV SOLN
1.0000 | Freq: Once | INTRAVENOUS | Status: AC
  Administered 2020-05-11: 11:00:00 50 mL via INTRAVENOUS
  Filled 2020-05-11: qty 50

## 2020-05-11 MED ORDER — SODIUM CHLORIDE 0.9 % IV SOLN: INTRAVENOUS | Status: DC

## 2020-05-11 MED ORDER — MIDAZOLAM HCL 2 MG/ML PO SYRP
4.0000 mg | ORAL_SOLUTION | Freq: Once | ORAL | Status: AC
  Administered 2020-05-11: 15:00:00 4 mg via ORAL

## 2020-05-11 MED ORDER — ONDANSETRON HCL 4 MG/2ML IJ SOLN
INTRAMUSCULAR | Status: AC
  Filled 2020-05-11: qty 2

## 2020-05-11 MED ORDER — INSULIN ASPART 100 UNIT/ML IV SOLN
5.0000 [IU] | Freq: Once | INTRAVENOUS | Status: AC
  Administered 2020-05-11: 11:00:00 5 [IU] via INTRAVENOUS
  Filled 2020-05-11: qty 0.05

## 2020-05-11 SURGICAL SUPPLY — 2 items
KIT DIALYSIS CATH TRI 30X13 (CATHETERS) ×2 IMPLANT
PACK ANGIOGRAPHY (CUSTOM PROCEDURE TRAY) ×2 IMPLANT

## 2020-05-11 NOTE — Progress Notes (Signed)
[place], [name] 05/11/20  Subjective:   Hospital day # 10 Overall continues to have diminished renal function. Creatinine clearance came back at 9. eGFR currently 14. However patient has hyperkalemia. Potassium up to 6.2. Has not moved her bowels in a week.  Renal: 12/28 0701 - 12/29 0700 In: 240 [P.O.:240] Out: 2800 [Urine:2800] Lab Results  Component Value Date   CREATININE 3.44 (H) 05/11/2020   CREATININE 3.58 (H) 05/10/2020   CREATININE 3.69 (H) 05/10/2020     Objective:  Vital signs in last 24 hours:  Temp:  [98.4 F (36.9 C)-99.1 F (37.3 C)] 99.1 F (37.3 C) (12/29 1202) Pulse Rate:  [65-73] 73 (12/29 1202) Resp:  [16-19] 19 (12/29 1202) BP: (125-137)/(55-64) 128/55 (12/29 1202) SpO2:  [97 %-98 %] 97 % (12/29 1202) Weight:  [90 kg] 90 kg (12/29 1300)  Weight change:  Filed Weights   05/09/20 0437 05/10/20 0027 05/11/20 1300  Weight: 88.5 kg 91.3 kg 90 kg    Intake/Output:    Intake/Output Summary (Last 24 hours) at 05/11/2020 1443 Last data filed at 05/11/2020 1427 Gross per 24 hour  Intake 840 ml  Output 3600 ml  Net -2760 ml     Physical Exam: General:  Chronically ill-appearing, laying in the bed  HEENT  anicteric, moist oral mucous membranes  Pulm/lungs  normal breathing effort  CVS/Heart  regular, no rub  Abdomen:   Soft, nontender  Extremities: + Dependent edema  Neurologic:  Alert, able to answer questions  Skin:  No acute rashes    Basic Metabolic Panel:  Recent Labs  Lab 05/07/20 0553 05/08/20 0518 05/09/20 0503 05/10/20 0516 05/10/20 1224 05/11/20 0612 05/11/20 0949  NA 139 140 140 141  --  140  --   K 4.8 4.5 4.9 5.5*  --  6.2* 5.6*  CL 109 110 110 110  --  109  --   CO2 $Re'22 22 24 26  'uUn$ --  24  --   GLUCOSE 91 79 102* 86  --  89  --   BUN 90* 97* 94* 99*  --  96*  --   CREATININE 3.32* 3.50* 3.47* 3.69* 3.58* 3.44*  --   CALCIUM 7.8* 7.9* 7.9* 8.2*  --  8.3*  --   MG 1.9 1.9 1.9 2.0   --  2.1  --   PHOS 6.2* 6.5* 7.0* 7.1*  --  6.6*  --      CBC: Recent Labs  Lab 05/05/20 0715 05/10/20 1224  WBC 6.3 5.2  HGB 9.7* 8.8*  HCT 30.0* 28.1*  MCV 96.8 99.6  PLT 137* 175     No results found for: HEPBSAG, HEPBSAB, HEPBIGM    Microbiology:  Recent Results (from the past 240 hour(s))  Body fluid culture     Status: None   Collection Time: 05/02/20  4:44 PM   Specimen: PATH Cytology Pleural fluid  Result Value Ref Range Status   Specimen Description   Final    PLEURAL Performed at [facility], [address], [place], [place] [address]    Special Requests   Final    NONE Performed at [facility], [address] [place], [place] [address]    Gram Stain   Final    MODERATE WBC PRESENT, PREDOMINANTLY MONONUCLEAR NO ORGANISMS SEEN    Culture   Final    NO GROWTH Performed at [facility], [place] [address] [place], [place] [address]    Report Status  05/06/2020 FINAL  Final    Coagulation Studies: No results for input(s): LABPROT, INR in the last 72 hours.  Urinalysis: No results for input(s): COLORURINE, LABSPEC, PHURINE, GLUCOSEU, HGBUR, BILIRUBINUR, KETONESUR, PROTEINUR, UROBILINOGEN, NITRITE, LEUKOCYTESUR in the last 72 hours.  Invalid input(s): APPERANCEUR    Imaging: CARDIAC CATHETERIZATION  Result Date: 05/10/2020 Conclusions: 1. Mildly elevated left heart, right heart, and pulmonary artery pressures. 2. Normal to supranormal Fick cardiac output/index. Recommendations: 1. Continue gentle diuresis. 2. Consider further workup of potential causes of high-output heart failure. Nelva Bush, MD [facility]     Medications:   . [MAR Hold] sodium chloride    . sodium chloride     . midazolam      . ondansetron      . [MAR Hold] amLODipine  10 mg Oral Daily  . [MAR Hold] carvedilol  12.5 mg Oral BID WC  . [MAR Hold] Chlorhexidine Gluconate Cloth  6 each Topical Q0600  . [MAR Hold] furosemide  40 mg  Intravenous Q12H  . [MAR Hold] heparin  5,000 Units Subcutaneous Q8H  . [MAR Hold] insulin aspart  0-9 Units Subcutaneous TID WC  . [MAR Hold] polyethylene glycol  17 g Oral Daily  . [MAR Hold] senna-docusate  1 tablet Oral BID  . [MAR Hold] sodium bicarbonate  650 mg Oral TID  . [MAR Hold] sodium chloride flush  10 mL Intravenous Q12H  . [MAR Hold] sodium chloride flush  3 mL Intravenous Q12H   [MAR Hold] sodium chloride, [MAR Hold] acetaminophen **OR** [MAR Hold] acetaminophen, [MAR Hold] ALPRAZolam, [MAR Hold] bisacodyl, [MAR Hold] guaiFENesin-dextromethorphan, [MAR Hold] melatonin, [MAR Hold] ondansetron **OR** [MAR Hold] ondansetron (ZOFRAN) IV, [MAR Hold] oxyCODONE-acetaminophen, [MAR Hold] sodium chloride flush  Assessment/ Plan:  64 y.o. female with heart failure with preserved EF, hypertension, CKD, stroke   admitted on 04/30/2020 for Chronic kidney disease (CKD), stage IV (severe) (HCC) [N18.4] Volume overload [E87.70] #AKI with chronic kidney disease stage IV/hyperkalemia Current creatinine of 3.5, GFR 14 Awaiting records from previous nephrology evaluation in [place].   Renal ultrasound shows echogenic kidneys Hemoglobin A1c 4.7% May 01, 2020 Underlying CKD likely secondary to hypertension and atherosclerosis Creatinine clearance came back low at 9. Creatinine 3.4 EGFR 14 with BUN of 96. -Creatinine clearance came back quite low at 9 and EGFR 14.  Patient does appear to have diminished muscle mass.  Also now having significant hyperkalemia.  Avoiding [place] and [place]elma given no bowel movement in 1 week.  Had long discussion with the patient.  We advised her to proceed with dialysis treatment at this time.  Temporary dialysis catheter to be placed.  #Proteinuria Urine protein to creatinine ratio 5.35 g May 03, 2020 Serological studies May 04, 2020: ANA negative, ANCA negative, kappa lambda ratio 1.6 Will consider ACE inhibitor/ARB once serum creatinine is  determined to be stable  #Severe hypertension, lower extremity edema 2D echo with LVEF 55 to 60%, moderately elevated pulmonary artery systolic pressures, moderate pericardial effusion, Mild to moderate aortic stenosiscurrently managed with torsemide as needed -Continue amlodipine and carvedilol.   LOS: 10 Ransom Nickson 12/29/20212:43 PM  [place], [place]  Note: This note was prepared with Dragon dictation. Any transcription errors are unintentional

## 2020-05-11 NOTE — H&P (View-Only) (Signed)
[facility] Vascular Consult Note  MRN : [id number]  Katie [name] is a 64 y.o. ([birth year]) female who presents with chief complaint of  Chief Complaint  Patient presents with  . Weakness  .  History of Present Illness: Patient is admitted to the hospital with volume overload and worsening renal function and has developed acute renal failure. She had stage 4 CKD prior.  The patient reports SOB with activity and swelling as well as proteinuria noted on testing.  The patient is critically ill with BUN markedly elevated. The nephrology service has decided to initiate dialysis at this time, and we are asked to place a temporary dialysis catheter for immediate dialysis use.    Current Facility-Administered Medications  Medication Dose Route Frequency Provider Last Rate Last Admin  . midazolam (VERSED) 2 MG/ML syrup           . ondansetron (ZOFRAN) 4 MG/2ML injection           . [MAR Hold] 0.9 %  sodium chloride infusion  250 mL Intravenous PRN End, Harrell Gave, MD      . 0.9 %  sodium chloride infusion   Intravenous Continuous Lynasia Meloche, Erskine Squibb, MD      . Doug Sou Hold] acetaminophen (TYLENOL) tablet 650 mg  650 mg Oral Q6H PRN End, Harrell Gave, MD       Or  . Doug Sou Hold] acetaminophen (TYLENOL) suppository 650 mg  650 mg Rectal Q6H PRN End, Harrell Gave, MD      . Doug Sou Hold] ALPRAZolam Duanne Moron) tablet 0.25 mg  0.25 mg Oral TID PRN End, Harrell Gave, MD      . Doug Sou Hold] amLODipine (NORVASC) tablet 10 mg  10 mg Oral Daily End, Christopher, MD   10 mg at 05/11/20 0923  . [MAR Hold] bisacodyl (DULCOLAX) suppository 10 mg  10 mg Rectal Daily PRN End, Harrell Gave, MD   10 mg at 05/11/20 0926  . [MAR Hold] carvedilol (COREG) tablet 12.5 mg  12.5 mg Oral BID WC End, Christopher, MD   12.5 mg at 05/11/20 0923  . [MAR Hold] Chlorhexidine Gluconate Cloth 2 % PADS 6 each  6 each Topical Q0600 Lateef, Munsoor, MD      . Doug Sou Hold] furosemide (LASIX) injection 40 mg  40 mg Intravenous Q12H  Christell Faith M, PA-C   40 mg at 05/11/20 1044  . [MAR Hold] guaiFENesin-dextromethorphan (ROBITUSSIN DM) 100-10 MG/5ML syrup 5 mL  5 mL Oral Q4H PRN End, Harrell Gave, MD   5 mL at 05/11/20 0923  . [MAR Hold] heparin injection 5,000 Units  5,000 Units Subcutaneous Q8H End, Christopher, MD   5,000 Units at 05/11/20 0708  . [MAR Hold] insulin aspart (novoLOG) injection 0-9 Units  0-9 Units Subcutaneous TID WC End, Christopher, MD   1 Units at 05/11/20 1243  . [MAR Hold] melatonin tablet 5 mg  5 mg Oral QHS PRN End, Harrell Gave, MD   5 mg at 05/10/20 2207  . [MAR Hold] ondansetron (ZOFRAN) tablet 4 mg  4 mg Oral Q6H PRN End, Harrell Gave, MD       Or  . Doug Sou Hold] ondansetron (ZOFRAN) injection 4 mg  4 mg Intravenous Q6H PRN End, Harrell Gave, MD   4 mg at 05/11/20 1430  . [MAR Hold] oxyCODONE-acetaminophen (PERCOCET/ROXICET) 5-325 MG per tablet 1 tablet  1 tablet Oral Q6H PRN End, Harrell Gave, MD   1 tablet at 05/11/20 0923  . [MAR Hold] polyethylene glycol (MIRALAX / GLYCOLAX) packet 17 g  17 g Oral Daily End,  Harrell Gave, MD   17 g at 05/11/20 0932  . [MAR Hold] senna-docusate (Senokot-S) tablet 1 tablet  1 tablet Oral BID End, Harrell Gave, MD   1 tablet at 05/11/20 0923  . [MAR Hold] sodium bicarbonate tablet 650 mg  650 mg Oral TID End, Christopher, MD   650 mg at 05/11/20 0923  . [MAR Hold] sodium chloride flush (NS) 0.9 % injection 10 mL  10 mL Intravenous Q12H End, Christopher, MD   10 mL at 05/11/20 0924  . [MAR Hold] sodium chloride flush (NS) 0.9 % injection 3 mL  3 mL Intravenous Q12H End, Christopher, MD   3 mL at 05/11/20 0924  . [MAR Hold] sodium chloride flush (NS) 0.9 % injection 3 mL  3 mL Intravenous PRN End, Harrell Gave, MD        Past Medical History:  Diagnosis Date  . 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[place])   . Hypertension   . Renal disorder   . Type 2 diabetes mellitus ([place])     Past Surgical History:  Procedure Laterality Date  . APPENDECTOMY    . MANDIBLE  SURGERY    . RENAL BIOPSY Right   . RIGHT HEART CATH N/A 05/10/2020   Procedure: RIGHT HEART CATH;  Surgeon: Nelva Bush, MD;  Location: [facility];  Service: Cardiovascular;  Laterality: N/A;    Social History   Tobacco Use  . Smoking status: Former Research scientist (life sciences)  . Smokeless tobacco: Never Used  Vaping Use  . Vaping Use: Never used  Substance Use Topics  . Alcohol use: Not Currently     Family History  Problem Relation Age of Onset  . Heart attack Mother   . Asthma Mother   . Uterine cancer Mother   . Skin cancer Father   . Prostate cancer Father   . CAD Father   . Parkinson's disease Father   . Lung cancer Brother     Allergies  Allergen Reactions  . Penicillins Nausea And Vomiting    Pt states "only pills" make her vomit     REVIEW OF SYSTEMS (Negative unless checked)  Constitutional: [] Weight loss  [] Fever  [] Chills Cardiac: [] Chest pain   [] Chest pressure   [] Palpitations   [] Shortness of breath when laying flat   [x] Shortness of breath at rest   [x] Shortness of breath with exertion. Vascular:  [] Pain in legs with walking   [] Pain in legs at rest   [] Pain in legs when laying flat   [] Claudication   [] Pain in feet when walking  [] Pain in feet at rest  [] Pain in feet when laying flat   [] History of DVT   [] Phlebitis   [x] Swelling in legs   [] Varicose veins   [] Non-healing ulcers Pulmonary:   [] Uses home oxygen   [] Productive cough   [] Hemoptysis   [] Wheeze  [] COPD   [] Asthma Neurologic:  [] Dizziness  [] Blackouts   [] Seizures   [] History of stroke   [] History of TIA  [] Aphasia   [] Temporary blindness   [] Dysphagia   [] Weakness or numbness in arms   [] Weakness or numbness in legs Musculoskeletal:  [x] Arthritis   [] Joint swelling   [] Joint pain   [] Low back pain Hematologic:  [] Easy bruising  [] Easy bleeding   [] Hypercoagulable state   [x] Anemic  [] Hepatitis Gastrointestinal:  [] Blood in stool   [] Vomiting blood  [] Gastroesophageal reflux/heartburn    [] Difficulty swallowing. Genitourinary:  [x] Chronic kidney disease   [] Difficult urination  [] Frequent urination  [] Burning with urination   []   Blood in urine Skin:  [] Rashes   [] Ulcers   [] Wounds Psychological:  [] History of anxiety   []  History of major depression.      Physical Examination  Vitals:   05/11/20 0448 05/11/20 0755 05/11/20 1202 05/11/20 1300  BP: 137/64 (!) 131/59 (!) 128/55   Pulse: 71 71 73   Resp: 18 18 19    Temp: 98.7 F (37.1 C) 98.5 F (36.9 C) 99.1 F (37.3 C)   TempSrc: Oral Oral Oral   SpO2: 98% 98% 97%   Weight:    90 kg  Height:       Body mass index is 32.02 kg/m. Gen: WD/WN Head: [place]/AT, No temporalis wasting Ear/Nose/Throat: Hearing grossly intact, nares w/o erythema or drainage Eyes: Sclera non-icteric, conjunctiva clear Neck: Supple, no nuchal rigidity.  No JVD.  Pulmonary:  Good air movement, diminished bilaterally.  Cardiac: RRR, normal S1, S2, no Murmurs, rubs or gallops. Vascular: feet warm, pulses not easily palpable Gastrointestinal: soft, non-tender/non-distended. No guarding/reflex.  Musculoskeletal: M/S 5/5 throughout.  Extremities without ischemic changes.  No deformity or atrophy. 2+ Edema in the lower extremities bilaterally Psychiatric: Normal affect and mood Dermatologic: No rashes or ulcers noted.        CBC Lab Results  Component Value Date   WBC 5.2 05/10/2020   HGB 8.8 (L) 05/10/2020   HCT 28.1 (L) 05/10/2020   MCV 99.6 05/10/2020   PLT 175 05/10/2020    BMET    Component Value Date/Time   NA 140 05/11/2020 0612   K 5.6 (H) 05/11/2020 0949   CL 109 05/11/2020 0612   CO2 24 05/11/2020 0612   GLUCOSE 89 05/11/2020 0612   BUN 96 (H) 05/11/2020 0612   CREATININE 3.44 (H) 05/11/2020 0612   CALCIUM 8.3 (L) 05/11/2020 0612   GFRNONAA 14 (L) 05/11/2020 0612   Estimated Creatinine Clearance: 19.2 mL/min (A) (by C-G formula based on SCr of 3.44 mg/dL (H)).  COAG No results found for: INR,  PROTIME  Radiology DG Chest 1 View  Result Date: 05/02/2020 CLINICAL DATA:  Follow-up pleural effusions status post thoracentesis. EXAM: CHEST  1 VIEW COMPARISON:  05/01/2020 FINDINGS: Stable cardiomediastinal contours. Decreased volume of right pleural effusion status post thoracentesis. No pneumothorax identified. Stable appearance of left pleural effusion. IMPRESSION: Decreased volume of right pleural effusion status post thoracentesis. No pneumothorax. Electronically Signed   By: Kerby Moors M.D.   On: 05/02/2020 17:15   DG Chest 2 View  Result Date: 05/08/2020 CLINICAL DATA:  Status post thoracentesis. EXAM: CHEST - 2 VIEW COMPARISON:  05/02/2020 FINDINGS: The cardio pericardial silhouette is enlarged. Bibasilar atelectasis/infiltrate associated small to moderate bilateral pleural effusions. No pneumothorax. Bones are diffusely demineralized. IMPRESSION: Cardiomegaly with bibasilar collapse/consolidative opacity and small to moderate bilateral pleural effusions. Interval increase in right pleural fluid volume. Electronically Signed   By: Misty Stanley M.D.   On: 05/08/2020 07:53   DG Chest 2 View  Result Date: 05/01/2020 CLINICAL DATA:  Former smoker.  Congestive heart failure. EXAM: CHEST - 2 VIEW COMPARISON:  April 30, 2020 FINDINGS: The right-sided pleural effusion and underlying opacity is stable. Stable cardiomegaly. The hila and mediastinum are unchanged. No pneumothorax. No other interval changes. IMPRESSION: Persistent right pleural effusion and underlying opacity, similar in the interval. Electronically Signed   By: Dorise Bullion III M.D   On: 05/01/2020 12:44   DG Chest 2 View  Result Date: 04/30/2020 CLINICAL DATA:  Shortness of breath. EXAM: CHEST - 2 VIEW COMPARISON:  None.  FINDINGS: Cardiac silhouette is enlarged. There are bibasilar densities, right side greater than left. Evidence for pleural effusions, right side greater than left. Negative for pneumothorax.  Atherosclerotic calcifications at the aortic arch. IMPRESSION: Cardiomegaly with bibasilar chest densities. Findings are suggestive for bilateral pleural effusions, right side greater than left. There may be underlying consolidation or parenchymal disease in the right lung. Electronically Signed   By: Markus Daft M.D.   On: 04/30/2020 15:48   CARDIAC CATHETERIZATION  Result Date: 05/10/2020 Conclusions: 1. Mildly elevated left heart, right heart, and pulmonary artery pressures. 2. Normal to supranormal Fick cardiac output/index. Recommendations: 1. Continue gentle diuresis. 2. Consider further workup of potential causes of high-output heart failure. Nelva Bush, MD [facility]   US RENAL  Result Date: 05/01/2020 CLINICAL DATA:  Active tubular necrosis. EXAM: RENAL / URINARY TRACT ULTRASOUND COMPLETE COMPARISON:  None. FINDINGS: Right Kidney: Renal measurements: 11.1 x 5.2 x 5.1 cm = volume: 155 mL. There is no hydronephrosis. There is increased cortical echogenicity. There is a cyst in the interpolar region of the kidney. Left Kidney: Renal measurements: 11.1 x 6 x 5.4 cm = volume: 187 mL. There is no hydronephrosis. There is increased cortical echogenicity. Bladder: There is debris within the urinary bladder. Both ureteral jets were visualized. Other: Incidentally noted was a right-sided pleural effusion. IMPRESSION: 1. No hydronephrosis. 2. Echogenic kidneys which can be seen in patients with medical renal disease. 3. Incidentally noted right-sided pleural effusion. 4. Debris within the urinary bladder. This is nonspecific and should be correlated with urinalysis. Electronically Signed   By: Constance Holster M.D.   On: 05/01/2020 21:07   ECHOCARDIOGRAM COMPLETE  Result Date: 05/01/2020    ECHOCARDIOGRAM REPORT   Patient Name:   [facility] Date of Exam: 05/01/2020 Medical Rec #:  [id number]    Height:       66.0 in Accession #:    [id number]   Weight:       201.7 lb Date of Birth:   [birth year]   BSA:          2.007 m Patient Age:    14 years     BP:           158/77 mmHg Patient Gender: F            HR:           83 bpm. Exam Location:  [facility] Procedure: 2D Echo Indications:     CHF  History:         Patient has no prior history of Echocardiogram examinations.                  CHF; Risk Factors:Hypertension.  Sonographer:     L Thornton-Maynard Referring Phys:  [id number] [place] Diagnosing Phys: Neoma Laming MD IMPRESSIONS  1. Left ventricular ejection fraction, by estimation, is 55 to 60%. The left ventricle has normal function. The left ventricle has no regional wall motion abnormalities. There is severe concentric left ventricular hypertrophy. Left ventricular diastolic  parameters are consistent with Grade II diastolic dysfunction (pseudonormalization).  2. Right ventricular systolic function is moderately reduced. The right ventricular size is normal. Mildly increased right ventricular wall thickness. There is moderately elevated pulmonary artery systolic pressure.  3. Left atrial size was mildly dilated.  4. Right atrial size was mildly dilated.  5. Moderate pericardial effusion. The pericardial effusion is circumferential.  6. The mitral valve is abnormal. Mild to moderate mitral valve regurgitation.  7. The  tricuspid valve is abnormal. Tricuspid valve regurgitation is mild to moderate.  8. The aortic valve is abnormal. Aortic valve regurgitation is mild to moderate. Mild to moderate aortic valve stenosis. FINDINGS  Left Ventricle: Left ventricular ejection fraction, by estimation, is 55 to 60%. The left ventricle has normal function. The left ventricle has no regional wall motion abnormalities. The left ventricular internal cavity size was small. There is severe concentric left ventricular hypertrophy. Left ventricular diastolic parameters are consistent with Grade II diastolic dysfunction (pseudonormalization). Right Ventricle: The right ventricular size is normal. Mildly  increased right ventricular wall thickness. Right ventricular systolic function is moderately reduced. There is moderately elevated pulmonary artery systolic pressure. The tricuspid regurgitant velocity is 3.47 m/s, and with an assumed right atrial pressure of 3 mmHg, the estimated right ventricular systolic pressure is 55.9 mmHg. Left Atrium: Left atrial size was mildly dilated. Right Atrium: Right atrial size was mildly dilated. Pericardium: A moderately sized pericardial effusion is present. The pericardial effusion is circumferential. Mitral Valve: The mitral valve is abnormal. Mild to moderate mitral valve regurgitation. MV peak gradient, 11.8 mmHg. The mean mitral valve gradient is 6.0 mmHg. Tricuspid Valve: The tricuspid valve is abnormal. Tricuspid valve regurgitation is mild to moderate. Aortic Valve: The aortic valve is abnormal. Aortic valve regurgitation is mild to moderate. Aortic regurgitation PHT measures 334 msec. Mild to moderate aortic stenosis is present. Aortic valve mean gradient measures 16.0 mmHg. Aortic valve peak gradient  measures 38.7 mmHg. Aortic valve area, by VTI measures 1.87 cm. Pulmonic Valve: The pulmonic valve was normal in structure. Pulmonic valve regurgitation is trivial. Aorta: The aortic root was not well visualized. IAS/Shunts: No atrial level shunt detected by color flow Doppler.  LEFT VENTRICLE PLAX 2D LVIDd:         4.63 cm  Diastology LVIDs:         3.04 cm  LV e' medial:    5.87 cm/s LV PW:         1.08 cm  LV E/e' medial:  20.0 LV IVS:        1.61 cm  LV e' lateral:   6.74 cm/s LVOT diam:     2.20 cm  LV E/e' lateral: 17.4 LV SV:         105 LV SV Index:   52 LVOT Area:     3.80 cm  RIGHT VENTRICLE RV S prime:     12.50 cm/s TAPSE (M-mode): 2.3 cm LEFT ATRIUM             Index       RIGHT ATRIUM           Index LA diam:        3.30 cm 1.64 cm/m  RA Area:     15.50 cm LA Vol (A2C):   66.9 ml 33.33 ml/m RA Volume:   39.50 ml  19.68 ml/m LA Vol (A4C):   73.6 ml 36.67  ml/m LA Biplane Vol: 73.0 ml 36.37 ml/m  AORTIC VALVE                    PULMONIC VALVE AV Area (Vmax):    1.44 cm     PV Vmax:       0.96 m/s AV Area (Vmean):   1.75 cm     PV Peak grad:  3.7 mmHg AV Area (VTI):     1.87 cm AV Vmax:           311.00 cm/s  AV Vmean:          187.000 cm/s AV VTI:            0.563 m AV Peak Grad:      38.7 mmHg AV Mean Grad:      16.0 mmHg LVOT Vmax:         118.00 cm/s LVOT Vmean:        85.900 cm/s LVOT VTI:          0.277 m LVOT/AV VTI ratio: 0.49 AI PHT:            334 msec  AORTA Ao Root diam: 3.20 cm MITRAL VALVE                TRICUSPID VALVE MV Area (PHT): 3.86 cm     TR Peak grad:   48.2 mmHg MV Peak grad:  11.8 mmHg    TR Vmax:        347.00 cm/s MV Mean grad:  6.0 mmHg MV Vmax:       1.72 m/s     SHUNTS MV Vmean:      119.0 cm/s   Systemic VTI:  0.28 m MV E velocity: 117.50 cm/s  Systemic Diam: 2.20 cm MV A velocity: 114.50 cm/s MV E/A ratio:  1.03 Neoma Laming MD Electronically signed by Neoma Laming MD Signature Date/Time: 05/01/2020/10:11:01 PM    Final    US THORACENTESIS ASP PLEURAL SPACE W/IMG GUIDE  Result Date: 05/02/2020 INDICATION: 64 year old with a right pleural effusion. Request for thoracentesis. EXAM: ULTRASOUND GUIDED RIGHT THORACENTESIS MEDICATIONS: None. COMPLICATIONS: None immediate. PROCEDURE: An ultrasound guided thoracentesis was thoroughly discussed with the patient and questions answered. The benefits, risks, alternatives and complications were also discussed. The patient understands and wishes to proceed with the procedure. Written consent was obtained. Ultrasound was performed to localize and mark an adequate pocket of fluid in the right chest. The area was then prepped and draped in the normal sterile fashion. 1% Lidocaine was used for local anesthesia. Under ultrasound guidance a 6 Fr Safe-T-Centesis catheter was introduced. Thoracentesis was performed. The catheter was removed and a dressing applied. FINDINGS: A total of approximately  1.6 L of yellow fluid was removed. Samples were sent to the laboratory as requested by the clinical team. IMPRESSION: Successful ultrasound guided right thoracentesis yielding 1.6 L of pleural fluid. Electronically Signed   By: Markus Daft M.D.   On: 05/02/2020 17:13      Assessment/Plan 1. ARF on top og CKD stage 4 We will proceed with temporary dialysis catheter placement at this time.  Risks and benefits discussed with patient and/or family, and the catheter will be placed to allow immediate initiation of dialysis.  If the patient's renal function does not improve throughout the hospital course, we will be happy to place a tunneled dialysis catheter for long term use prior to discharge.   2.  Hypertension. Severe and poorly controlled. Likely an underlying cause of her renal failure and a risk for arterial disease and heart disease as well. 3. Diabetes. Fairly well controlled and blood glucose control important in reducing the progression of atherosclerotic disease. Also, involved in wound healing. On appropriate medications.    Leotis Pain, MD  05/11/2020 2:35 PM

## 2020-05-11 NOTE — Consult Note (Signed)
[facility] Vascular Consult Note  MRN : [id number]  Katie [name] is a 64 y.o. (Oct 05, 1955) female who presents with chief complaint of  Chief Complaint  Patient presents with   Weakness  .  History of Present Illness: Patient is admitted to the hospital with volume overload and worsening renal function and has developed acute renal failure. She had stage 4 CKD prior.  The patient reports SOB with activity and swelling as well as proteinuria noted on testing.  The patient is critically ill with BUN markedly elevated. The nephrology service has decided to initiate dialysis at this time, and we are asked to place a temporary dialysis catheter for immediate dialysis use.    Current Facility-Administered Medications  Medication Dose Route Frequency Provider Last Rate Last Admin   midazolam (VERSED) 2 MG/ML syrup            ondansetron (ZOFRAN) 4 MG/2ML injection            [MAR Hold] 0.9 %  sodium chloride infusion  250 mL Intravenous PRN End, Harrell Gave, MD       0.9 %  sodium chloride infusion   Intravenous Continuous Belton Peplinski, Erskine Squibb, MD       [MAR Hold] acetaminophen (TYLENOL) tablet 650 mg  650 mg Oral Q6H PRN End, Harrell Gave, MD       Or   Doug Sou Hold] acetaminophen (TYLENOL) suppository 650 mg  650 mg Rectal Q6H PRN End, Harrell Gave, MD       [facility] Sou Hold] ALPRAZolam Duanne Moron) tablet 0.25 mg  0.25 mg Oral TID PRN End, Harrell Gave, MD       Doug Sou Hold] amLODipine (NORVASC) tablet 10 mg  10 mg Oral Daily End, Christopher, MD   10 mg at 05/11/20 0923   [MAR Hold] bisacodyl (DULCOLAX) suppository 10 mg  10 mg Rectal Daily PRN End, Christopher, MD   10 mg at 05/11/20 0926   [MAR Hold] carvedilol (COREG) tablet 12.5 mg  12.5 mg Oral BID WC End, Christopher, MD   12.5 mg at 05/11/20 0923   [MAR Hold] Chlorhexidine Gluconate Cloth 2 % PADS 6 each  6 each Topical Q0600 Lateef, Munsoor, MD       [MAR Hold] furosemide (LASIX) injection 40 mg  40 mg Intravenous Q12H  Dunn, Ryan M, PA-C   40 mg at 05/11/20 1044   [MAR Hold] guaiFENesin-dextromethorphan (ROBITUSSIN DM) 100-10 MG/5ML syrup 5 mL  5 mL Oral Q4H PRN End, Christopher, MD   5 mL at 05/11/20 0923   [MAR Hold] heparin injection 5,000 Units  5,000 Units Subcutaneous Q8H End, Christopher, MD   5,000 Units at 05/11/20 0708   [MAR Hold] insulin aspart (novoLOG) injection 0-9 Units  0-9 Units Subcutaneous TID WC End, Christopher, MD   1 Units at 05/11/20 1243   [MAR Hold] melatonin tablet 5 mg  5 mg Oral QHS PRN End, Harrell Gave, MD   5 mg at 05/10/20 2207   [MAR Hold] ondansetron (ZOFRAN) tablet 4 mg  4 mg Oral Q6H PRN End, Harrell Gave, MD       Or   Doug Sou Hold] ondansetron (ZOFRAN) injection 4 mg  4 mg Intravenous Q6H PRN End, Christopher, MD   4 mg at 05/11/20 1430   [MAR Hold] oxyCODONE-acetaminophen (PERCOCET/ROXICET) 5-325 MG per tablet 1 tablet  1 tablet Oral Q6H PRN End, Christopher, MD   1 tablet at 05/11/20 0923   [MAR Hold] polyethylene glycol (MIRALAX / GLYCOLAX) packet 17 g  17 g Oral Daily End,  Harrell Gave, MD   17 g at 05/11/20 0923   [MAR Hold] senna-docusate (Senokot-S) tablet 1 tablet  1 tablet Oral BID End, Christopher, MD   1 tablet at 05/11/20 6789   [facility] Hold] sodium bicarbonate tablet 650 mg  650 mg Oral TID End, Christopher, MD   650 mg at 05/11/20 3810   [facility] Hold] sodium chloride flush (NS) 0.9 % injection 10 mL  10 mL Intravenous Q12H End, Christopher, MD   10 mL at 05/11/20 0924   [MAR Hold] sodium chloride flush (NS) 0.9 % injection 3 mL  3 mL Intravenous Q12H End, Christopher, MD   3 mL at 05/11/20 0924   [MAR Hold] sodium chloride flush (NS) 0.9 % injection 3 mL  3 mL Intravenous PRN End, Christopher, MD        Past Medical History:  Diagnosis Date   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[place])    Hypertension    Renal disorder    Type 2 diabetes mellitus ([place])     Past Surgical History:  Procedure Laterality Date   APPENDECTOMY     MANDIBLE  SURGERY     RENAL BIOPSY Right    RIGHT HEART CATH N/A 05/10/2020   Procedure: RIGHT HEART CATH;  Surgeon: Nelva Bush, MD;  Location: [place] [facility];  Service: Cardiovascular;  Laterality: N/A;    Social History   Tobacco Use   Smoking status: Former Smoker   Smokeless tobacco: Never Used  Scientific laboratory technician Use: Never used  Substance Use Topics   Alcohol use: Not Currently     Family History  Problem Relation Age of Onset   Heart attack Mother    Asthma Mother    Uterine cancer Mother    Skin cancer Father    Prostate cancer Father    CAD Father    Parkinson's disease Father    Lung cancer Brother     Allergies  Allergen Reactions   Penicillins Nausea And Vomiting    Pt states "only pills" make her vomit     REVIEW OF SYSTEMS (Negative unless checked)  Constitutional: [] Weight loss  [] Fever  [] Chills Cardiac: [] Chest pain   [] Chest pressure   [] Palpitations   [] Shortness of breath when laying flat   [x] Shortness of breath at rest   [x] Shortness of breath with exertion. Vascular:  [] Pain in legs with walking   [] Pain in legs at rest   [] Pain in legs when laying flat   [] Claudication   [] Pain in feet when walking  [] Pain in feet at rest  [] Pain in feet when laying flat   [] History of DVT   [] Phlebitis   [x] Swelling in legs   [] Varicose veins   [] Non-healing ulcers Pulmonary:   [] Uses home oxygen   [] Productive cough   [] Hemoptysis   [] Wheeze  [] COPD   [] Asthma Neurologic:  [] Dizziness  [] Blackouts   [] Seizures   [] History of stroke   [] History of TIA  [] Aphasia   [] Temporary blindness   [] Dysphagia   [] Weakness or numbness in arms   [] Weakness or numbness in legs Musculoskeletal:  [x] Arthritis   [] Joint swelling   [] Joint pain   [] Low back pain Hematologic:  [] Easy bruising  [] Easy bleeding   [] Hypercoagulable state   [x] Anemic  [] Hepatitis Gastrointestinal:  [] Blood in stool   [] Vomiting blood  [] Gastroesophageal reflux/heartburn    [] Difficulty swallowing. Genitourinary:  [x] Chronic kidney disease   [] Difficult urination  [] Frequent urination  [] Burning with urination   []   Blood in urine Skin:  [] Rashes   [] Ulcers   [] Wounds Psychological:  [] History of anxiety   []  History of major depression.      Physical Examination  Vitals:   05/11/20 0448 05/11/20 0755 05/11/20 1202 05/11/20 1300  BP: 137/64 (!) 131/59 (!) 128/55   Pulse: 71 71 73   Resp: 18 18 19    Temp: 98.7 F (37.1 C) 98.5 F (36.9 C) 99.1 F (37.3 C)   TempSrc: Oral Oral Oral   SpO2: 98% 98% 97%   Weight:    90 kg  Height:       Body mass index is 32.02 kg/m. Gen: WD/WN Head: [place]/AT, No temporalis wasting Ear/Nose/Throat: Hearing grossly intact, nares w/o erythema or drainage Eyes: Sclera non-icteric, conjunctiva clear Neck: Supple, no nuchal rigidity.  No JVD.  Pulmonary:  Good air movement, diminished bilaterally.  Cardiac: RRR, normal S1, S2, no Murmurs, rubs or gallops. Vascular: feet warm, pulses not easily palpable Gastrointestinal: soft, non-tender/non-distended. No guarding/reflex.  Musculoskeletal: M/S 5/5 throughout.  Extremities without ischemic changes.  No deformity or atrophy. 2+ Edema in the lower extremities bilaterally Psychiatric: Normal affect and mood Dermatologic: No rashes or ulcers noted.        CBC Lab Results  Component Value Date   WBC 5.2 05/10/2020   HGB 8.8 (L) 05/10/2020   HCT 28.1 (L) 05/10/2020   MCV 99.6 05/10/2020   PLT 175 05/10/2020    BMET    Component Value Date/Time   NA 140 05/11/2020 0612   K 5.6 (H) 05/11/2020 0949   CL 109 05/11/2020 0612   CO2 24 05/11/2020 0612   GLUCOSE 89 05/11/2020 0612   BUN 96 (H) 05/11/2020 0612   CREATININE 3.44 (H) 05/11/2020 0612   CALCIUM 8.3 (L) 05/11/2020 0612   GFRNONAA 14 (L) 05/11/2020 0612   Estimated Creatinine Clearance: 19.2 mL/min (A) (by C-G formula based on SCr of 3.44 mg/dL (H)).  COAG No results found for: INR,  PROTIME  Radiology DG Chest 1 View  Result Date: 05/02/2020 CLINICAL DATA:  Follow-up pleural effusions status post thoracentesis. EXAM: CHEST  1 VIEW COMPARISON:  05/01/2020 FINDINGS: Stable cardiomediastinal contours. Decreased volume of right pleural effusion status post thoracentesis. No pneumothorax identified. Stable appearance of left pleural effusion. IMPRESSION: Decreased volume of right pleural effusion status post thoracentesis. No pneumothorax. Electronically Signed   By: Kerby Moors M.D.   On: 05/02/2020 17:15   DG Chest 2 View  Result Date: 05/08/2020 CLINICAL DATA:  Status post thoracentesis. EXAM: CHEST - 2 VIEW COMPARISON:  05/02/2020 FINDINGS: The cardio pericardial silhouette is enlarged. Bibasilar atelectasis/infiltrate associated small to moderate bilateral pleural effusions. No pneumothorax. Bones are diffusely demineralized. IMPRESSION: Cardiomegaly with bibasilar collapse/consolidative opacity and small to moderate bilateral pleural effusions. Interval increase in right pleural fluid volume. Electronically Signed   By: Misty Stanley M.D.   On: 05/08/2020 07:53   DG Chest 2 View  Result Date: 05/01/2020 CLINICAL DATA:  Former smoker.  Congestive heart failure. EXAM: CHEST - 2 VIEW COMPARISON:  April 30, 2020 FINDINGS: The right-sided pleural effusion and underlying opacity is stable. Stable cardiomegaly. The hila and mediastinum are unchanged. No pneumothorax. No other interval changes. IMPRESSION: Persistent right pleural effusion and underlying opacity, similar in the interval. Electronically Signed   By: Dorise Bullion III M.D   On: 05/01/2020 12:44   DG Chest 2 View  Result Date: 04/30/2020 CLINICAL DATA:  Shortness of breath. EXAM: CHEST - 2 VIEW COMPARISON:  None.  FINDINGS: Cardiac silhouette is enlarged. There are bibasilar densities, right side greater than left. Evidence for pleural effusions, right side greater than left. Negative for pneumothorax.  Atherosclerotic calcifications at the aortic arch. IMPRESSION: Cardiomegaly with bibasilar chest densities. Findings are suggestive for bilateral pleural effusions, right side greater than left. There may be underlying consolidation or parenchymal disease in the right lung. Electronically Signed   By: Markus Daft M.D.   On: 04/30/2020 15:48   CARDIAC CATHETERIZATION  Result Date: 05/10/2020 Conclusions: 1. Mildly elevated left heart, right heart, and pulmonary artery pressures. 2. Normal to supranormal Fick cardiac output/index. Recommendations: 1. Continue gentle diuresis. 2. Consider further workup of potential causes of high-output heart failure. Nelva Bush, MD [facility]   US RENAL  Result Date: 05/01/2020 CLINICAL DATA:  Active tubular necrosis. EXAM: RENAL / URINARY TRACT ULTRASOUND COMPLETE COMPARISON:  None. FINDINGS: Right Kidney: Renal measurements: 11.1 x 5.2 x 5.1 cm = volume: 155 mL. There is no hydronephrosis. There is increased cortical echogenicity. There is a cyst in the interpolar region of the kidney. Left Kidney: Renal measurements: 11.1 x 6 x 5.4 cm = volume: 187 mL. There is no hydronephrosis. There is increased cortical echogenicity. Bladder: There is debris within the urinary bladder. Both ureteral jets were visualized. Other: Incidentally noted was a right-sided pleural effusion. IMPRESSION: 1. No hydronephrosis. 2. Echogenic kidneys which can be seen in patients with medical renal disease. 3. Incidentally noted right-sided pleural effusion. 4. Debris within the urinary bladder. This is nonspecific and should be correlated with urinalysis. Electronically Signed   By: Constance Holster M.D.   On: 05/01/2020 21:07   ECHOCARDIOGRAM COMPLETE  Result Date: 05/01/2020    ECHOCARDIOGRAM REPORT   Patient Name:   Katie [name] Date of Exam: 05/01/2020 Medical Rec #:  [id number]    Height:       66.0 in Accession #:    [id number]   Weight:       201.7 lb Date of Birth:   Oct 20, 1955   BSA:          2.007 m Patient Age:    38 years     BP:           158/77 mmHg Patient Gender: F            HR:           83 bpm. Exam Location:  [facility] Procedure: 2D Echo Indications:     CHF  History:         Patient has no prior history of Echocardiogram examinations.                  CHF; Risk Factors:Hypertension.  Sonographer:     L Thornton-Maynard Referring Phys:  [id number] [place] Diagnosing Phys: Neoma Laming MD IMPRESSIONS  1. Left ventricular ejection fraction, by estimation, is 55 to 60%. The left ventricle has normal function. The left ventricle has no regional wall motion abnormalities. There is severe concentric left ventricular hypertrophy. Left ventricular diastolic  parameters are consistent with Grade II diastolic dysfunction (pseudonormalization).  2. Right ventricular systolic function is moderately reduced. The right ventricular size is normal. Mildly increased right ventricular wall thickness. There is moderately elevated pulmonary artery systolic pressure.  3. Left atrial size was mildly dilated.  4. Right atrial size was mildly dilated.  5. Moderate pericardial effusion. The pericardial effusion is circumferential.  6. The mitral valve is abnormal. Mild to moderate mitral valve regurgitation.  7. The  tricuspid valve is abnormal. Tricuspid valve regurgitation is mild to moderate.  8. The aortic valve is abnormal. Aortic valve regurgitation is mild to moderate. Mild to moderate aortic valve stenosis. FINDINGS  Left Ventricle: Left ventricular ejection fraction, by estimation, is 55 to 60%. The left ventricle has normal function. The left ventricle has no regional wall motion abnormalities. The left ventricular internal cavity size was small. There is severe concentric left ventricular hypertrophy. Left ventricular diastolic parameters are consistent with Grade II diastolic dysfunction (pseudonormalization). Right Ventricle: The right ventricular size is normal. Mildly  increased right ventricular wall thickness. Right ventricular systolic function is moderately reduced. There is moderately elevated pulmonary artery systolic pressure. The tricuspid regurgitant velocity is 3.47 m/s, and with an assumed right atrial pressure of 3 mmHg, the estimated right ventricular systolic pressure is 76.1 mmHg. Left Atrium: Left atrial size was mildly dilated. Right Atrium: Right atrial size was mildly dilated. Pericardium: A moderately sized pericardial effusion is present. The pericardial effusion is circumferential. Mitral Valve: The mitral valve is abnormal. Mild to moderate mitral valve regurgitation. MV peak gradient, 11.8 mmHg. The mean mitral valve gradient is 6.0 mmHg. Tricuspid Valve: The tricuspid valve is abnormal. Tricuspid valve regurgitation is mild to moderate. Aortic Valve: The aortic valve is abnormal. Aortic valve regurgitation is mild to moderate. Aortic regurgitation PHT measures 334 msec. Mild to moderate aortic stenosis is present. Aortic valve mean gradient measures 16.0 mmHg. Aortic valve peak gradient  measures 38.7 mmHg. Aortic valve area, by VTI measures 1.87 cm. Pulmonic Valve: The pulmonic valve was normal in structure. Pulmonic valve regurgitation is trivial. Aorta: The aortic root was not well visualized. IAS/Shunts: No atrial level shunt detected by color flow Doppler.  LEFT VENTRICLE PLAX 2D LVIDd:         4.63 cm  Diastology LVIDs:         3.04 cm  LV e' medial:    5.87 cm/s LV PW:         1.08 cm  LV E/e' medial:  20.0 LV IVS:        1.61 cm  LV e' lateral:   6.74 cm/s LVOT diam:     2.20 cm  LV E/e' lateral: 17.4 LV SV:         105 LV SV Index:   52 LVOT Area:     3.80 cm  RIGHT VENTRICLE RV S prime:     12.50 cm/s TAPSE (M-mode): 2.3 cm LEFT ATRIUM             Index       RIGHT ATRIUM           Index LA diam:        3.30 cm 1.64 cm/m  RA Area:     15.50 cm LA Vol (A2C):   66.9 ml 33.33 ml/m RA Volume:   39.50 ml  19.68 ml/m LA Vol (A4C):   73.6 ml 36.67  ml/m LA Biplane Vol: 73.0 ml 36.37 ml/m  AORTIC VALVE                    PULMONIC VALVE AV Area (Vmax):    1.44 cm     PV Vmax:       0.96 m/s AV Area (Vmean):   1.75 cm     PV Peak grad:  3.7 mmHg AV Area (VTI):     1.87 cm AV Vmax:           311.00 cm/s  AV Vmean:          187.000 cm/s AV VTI:            0.563 m AV Peak Grad:      38.7 mmHg AV Mean Grad:      16.0 mmHg LVOT Vmax:         118.00 cm/s LVOT Vmean:        85.900 cm/s LVOT VTI:          0.277 m LVOT/AV VTI ratio: 0.49 AI PHT:            334 msec  AORTA Ao Root diam: 3.20 cm MITRAL VALVE                TRICUSPID VALVE MV Area (PHT): 3.86 cm     TR Peak grad:   48.2 mmHg MV Peak grad:  11.8 mmHg    TR Vmax:        347.00 cm/s MV Mean grad:  6.0 mmHg MV Vmax:       1.72 m/s     SHUNTS MV Vmean:      119.0 cm/s   Systemic VTI:  0.28 m MV E velocity: 117.50 cm/s  Systemic Diam: 2.20 cm MV A velocity: 114.50 cm/s MV E/A ratio:  1.03 Neoma Laming MD Electronically signed by Neoma Laming MD Signature Date/Time: 05/01/2020/10:11:01 PM    Final    US THORACENTESIS ASP PLEURAL SPACE W/IMG GUIDE  Result Date: 05/02/2020 INDICATION: 64 year old with a right pleural effusion. Request for thoracentesis. EXAM: ULTRASOUND GUIDED RIGHT THORACENTESIS MEDICATIONS: None. COMPLICATIONS: None immediate. PROCEDURE: An ultrasound guided thoracentesis was thoroughly discussed with the patient and questions answered. The benefits, risks, alternatives and complications were also discussed. The patient understands and wishes to proceed with the procedure. Written consent was obtained. Ultrasound was performed to localize and mark an adequate pocket of fluid in the right chest. The area was then prepped and draped in the normal sterile fashion. 1% Lidocaine was used for local anesthesia. Under ultrasound guidance a 6 Fr Safe-T-Centesis catheter was introduced. Thoracentesis was performed. The catheter was removed and a dressing applied. FINDINGS: A total of approximately  1.6 L of yellow fluid was removed. Samples were sent to the laboratory as requested by the clinical team. IMPRESSION: Successful ultrasound guided right thoracentesis yielding 1.6 L of pleural fluid. Electronically Signed   By: Markus Daft M.D.   On: 05/02/2020 17:13      Assessment/Plan 1. ARF on top og CKD stage 4 We will proceed with temporary dialysis catheter placement at this time.  Risks and benefits discussed with patient and/or family, and the catheter will be placed to allow immediate initiation of dialysis.  If the patient's renal function does not improve throughout the hospital course, we will be happy to place a tunneled dialysis catheter for long term use prior to discharge.   2.  Hypertension. Severe and poorly controlled. Likely an underlying cause of her renal failure and a risk for arterial disease and heart disease as well. 3. Diabetes. Fairly well controlled and blood glucose control important in reducing the progression of atherosclerotic disease. Also, involved in wound healing. On appropriate medications.    Leotis Pain, MD  05/11/2020 2:35 PM

## 2020-05-11 NOTE — Progress Notes (Signed)
PROGRESS NOTE    Katie [name]  WEX:[id number] DOB: [birth year] DOA: 04/30/2020 PCP: Patient, [name]   Brief Narrative: Katie [name] is a 64 y.o. female with a history of diabetes mellitus type 2, stage IV CKD, heart failure, hypertension. Patient presented secondary to dyspnea with evidence of volume overload and pleural effusion.   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 Cardiomegaly on chest x-ray. Started on Lasix IV on admission but has now progressed to requiring hemodialysis. She has not had much weight change with IV lasix diuresis.  Pleural effusion In setting of fluid overload with new diagnosis of grade 2 diastolic heart failure. No previous history of pleural effusion. Diagnostic right thoracentesis performed on 12/20. No evidence of malignancy on cytology  Diabetes mellitus, type 2 Hemoglobin A1C of 4.7%. No need for pharmacologic treatment. Diet control.  CKD stage V Unknown baseline. Patient is unsure of her primary nephrologist in [place]. Creatinine of 2.97 on admission. Per patient, she was planned to start hemodialysis in addition to consideration for renal transplant. Nephrology consulted on 12/20. Worsening renal function in setting of attempted Lasix diuresis for heart failure. Nephrology now recommending initiation of hemodialysis. -Nephrology recommendations: HD today; vascular consult for IV access  Acute diastolic heart failure New diagnosis. Grade 2 dysfunction. Normal EF. Associated RV systolic dysfunction with  elevated PA pressure seen on Transthoracic Echocardiogram although RHC on 12/28 significant for mildly elevated right heart/pulmonary artery pressures. Weight down 1.7 kg from admission. Net IO Since Admission: -8,425 mL [05/11/20 0751] -Cardiology Recommendations: Lasix 80 mg IV -Daily weights/strict in and out  Moderate pericardial effusion No clinical evidence of tamponade. No recent infections. In setting of CKD V with elevated BUN. No chest pain. -Cardiology recommendation: outpatient Transthoracic Echocardiogram for surveillance  Mild/moderate aortic stenosis Unsure if this is symptomatic. Patient does have dyspnea with exertion but this may be multifactorial. -Cardiology recommendations: outpatient Transthoracic Echocardiogram for surveillance  Primary hypertension Patient is on at least hydrochlorothiazide as an outpatient but is unsure of her regimen. Currently uncontrolled. -Amlodipine 10 mg daily and Coreg 12.5 mg BID  Anemia Possible related to underlying kidney disease. Evidence of macrocytosis on CBC. TIBC low but otherwise normal iron panel. Vitamin B12 and folate normal.  Hyperkalemia In setting of CKD. Lokelma 10 g given with improvement.  Pressure injury Mid coccyx, POA  History of tobacco use Patient has quit for at least 10 years. Previously, 20 pack year history.  Headache Normal chronic headache. Patient states she takes Imitrex as needed. Resolved.  Obesity Body mass index is 32.47 kg/m.   DVT prophylaxis: Lovenox Code Status:   Code Status: Full Code Family Communication: None at bedside Disposition Plan: Discharge SNF likely in 2-4 days pending diuresis.   Consultants:   Nephrology  Cardiology  Procedures:   TRANSTHORACIC ECHOCARDIOGRAM (05/01/2020) IMPRESSIONS    1. Left ventricular ejection fraction, by estimation, is 55 to 60%. The  left ventricle has normal function. The left ventricle has no regional  wall motion abnormalities.  There is  severe concentric left ventricular  hypertrophy. Left ventricular diastolic  parameters are consistent with Grade II diastolic dysfunction  (pseudonormalization).  2. Right ventricular systolic function is moderately reduced. The right  ventricular size is normal. Mildly increased right ventricular wall  thickness. There is moderately elevated pulmonary artery systolic  pressure.  3. Left atrial size was mildly dilated.  4. Right atrial size was mildly dilated.  5. Moderate pericardial effusion. The pericardial effusion is  circumferential.  6. The mitral valve is abnormal. Mild to moderate mitral valve  regurgitation.  7. The tricuspid valve is abnormal. Tricuspid valve regurgitation is mild  to moderate.  8. The aortic valve is abnormal. Aortic valve regurgitation is mild to  moderate. Mild to moderate aortic valve stenosis.  RIGHT HEART CATHETERIZATION (05/10/2020) Conclusions: 1. Mildly elevated left heart, right heart, and pulmonary artery pressures. 2. Normal to supranormal Fick cardiac output/index.  Recommendations: 1. Continue gentle diuresis. 2. Consider further workup of potential causes of high-output heart failure.   Antimicrobials:  None    Subjective: Symptoms improved. Tolerated thoracentesis well. Headache.  Objective: Vitals:   05/10/20 1154 05/10/20 1533 05/10/20 1957 05/11/20 0448  BP: 118/60 (!) 129/59 125/60 137/64  Pulse: 61 65 68 71  Resp: 15 16 17 18   Temp:  98.4 F (36.9 C) 98.5 F (36.9 C) 98.7 F (37.1 C)  TempSrc:  Oral Oral Oral  SpO2: 98% 98% 98% 98%  Weight:      Height:        Intake/Output Summary (Last 24 hours) at 05/11/2020 0751 Last data filed at 05/11/2020 0500 Gross per 24 hour  Intake 240 ml  Output 2800 ml  Net -2560 ml   Filed Weights   05/08/20 0703 05/09/20 0437 05/10/20 0027  Weight: 92 kg 88.5 kg 91.3 kg    Examination:  General exam: Appears calm and comfortable Respiratory system:  Clear to auscultation. Respiratory effort normal. Cardiovascular system: S1 & S2 heard, RRR. 2/6 systolic murmuyr Gastrointestinal system: Abdomen is nondistended, soft and nontender. No organomegaly or masses felt. Normal bowel sounds heard. Central nervous system: Alert and oriented. No focal neurological deficits. Musculoskeletal: 2+ BLE pitting edema. No calf tenderness Skin: No cyanosis. No rashes Psychiatry: Judgement and insight appear normal. Mood & affect appropriate.    Data Reviewed: I have personally reviewed following labs and imaging studies  CBC Lab Results  Component Value Date   WBC 5.2 05/10/2020   RBC 2.82 (L) 05/10/2020   HGB 8.8 (L) 05/10/2020   HCT 28.1 (L) 05/10/2020   MCV 99.6 05/10/2020   MCH 31.2 05/10/2020   PLT 175 05/10/2020   MCHC 31.3 05/10/2020   RDW 13.3 20/94/7096     Last metabolic panel Lab Results  Component Value Date   NA 140 05/11/2020   K 6.2 (H) 05/11/2020   CL 109 05/11/2020   CO2 24 05/11/2020   BUN 96 (H) 05/11/2020   CREATININE 3.44 (H) 05/11/2020   GLUCOSE 89 05/11/2020   GFRNONAA 14 (L) 05/11/2020   CALCIUM 8.3 (L) 05/11/2020   PHOS 6.6 (H) 05/11/2020   PROT 5.6 (L) 05/02/2020   ALBUMIN 2.2 (L) 05/11/2020   LABGLOB 1.9 (L) 05/04/2020   AGRATIO 1.1 05/04/2020   BILITOT 0.9 04/30/2020   ALKPHOS 68 04/30/2020   AST 20 04/30/2020   ALT 26 04/30/2020   ANIONGAP 7 05/11/2020    CBG (last 3)  Recent Labs    05/10/20 1157 05/10/20 1627 05/10/20 2055  GLUCAP 104* 104* 137*  GFR: Estimated Creatinine Clearance: 19.3 mL/min (A) (by C-G formula based on SCr of 3.44 mg/dL (H)).  Coagulation Profile: No results for input(s): INR, PROTIME in the last 168 hours.  Recent Results (from the past 240 hour(s))  Body fluid culture     Status: None   Collection Time: 05/02/20  4:44 PM   Specimen: PATH Cytology Pleural fluid  Result Value Ref Range Status   Specimen Description   Final    PLEURAL Performed at [facility], [address], [place], [place] [address]    Special Requests   Final    NONE Performed at [facility], [address] [place], [place] [address]    Gram Stain   Final    MODERATE WBC PRESENT, PREDOMINANTLY MONONUCLEAR NO ORGANISMS SEEN    Culture   Final    NO GROWTH Performed at [facility] [address] [place], [place] [address]    Report Status 05/06/2020 FINAL  Final        Radiology Studies: CARDIAC CATHETERIZATION  Result Date: 05/10/2020 Conclusions: 1. Mildly elevated left heart, right heart, and pulmonary artery pressures. 2. Normal to supranormal Fick cardiac output/index. Recommendations: 1. Continue gentle diuresis. 2. Consider further workup of potential causes of high-output heart failure. Nelva Bush, MD [facility]        Scheduled Meds: . amLODipine  10 mg Oral Daily  . carvedilol  12.5 mg Oral BID WC  . heparin  5,000 Units Subcutaneous Q8H  . insulin aspart  0-9 Units Subcutaneous TID WC  . polyethylene glycol  17 g Oral Daily  . senna-docusate  1 tablet Oral BID  . sodium bicarbonate  650 mg Oral TID  . sodium chloride flush  10 mL Intravenous Q12H  . sodium chloride flush  3 mL Intravenous Q12H   Continuous Infusions: . sodium chloride       LOS: 10 days     Cordelia Poche, MD [facility] 05/11/2020, 7:51 AM  If 7PM-7AM, please contact night-coverage [email or web address]

## 2020-05-11 NOTE — Progress Notes (Signed)
Physical Therapy Treatment Patient Details Name: Katie [name] MRN: [id number] DOB: Apr 21, 1956 Today's Date: 05/11/2020    History of Present Illness Patient is a 64 y.o. female with a history of diabetes mellitus type 2, stage IV CKD, heart failure, hypertension. Patient presented secondary to dyspnea with evidence of volume overload and pleural effusion. Diagnosed with moderate pericardial effusion, mild to moderate aortic insufficiency and aortic stenosis.    PT Comments    Spoke with nursing prior to session who stated it was ok to see pt for OOB to chair activity in regards to high K+ levels. Pt received supine in bed and anxious to get moving. Pt also received Right heart cath yesterday without complications and will receive first session of dialysis this pm.  Pt transferred supine to sit with HOB raised and MinA. Sitting EOB x 10 minutes while completing dynamic balance activities without LOB.  Pt transferred sit to stand 3x with MinA and bed height raised. Stand pivot transfer with sidestepping to Right with MinA. Pt positioned in recliner chair to comfort with call bell in reach, all needs met.  Pt educated on continuing independent HEP daily. Continue PT per POC, continue to recommend SNF placement once medically stable.   Follow Up Recommendations  SNF     Equipment Recommendations  None recommended by PT    Recommendations for Other Services       Precautions / Restrictions Precautions Precautions: Fall Precaution Comments: R heart cath 12/28 with limited weight bearing aloud per pt.    Mobility  Bed Mobility Overal bed mobility: Needs Assistance Bed Mobility: Supine to Sit;Sit to Supine     Supine to sit: Min assist;HOB elevated        Transfers Overall transfer level: Needs assistance Equipment used: 1 person hand held assist Transfers: Sit to/from Stand Sit to Stand: Min assist;From elevated surface         General transfer comment: MinA to side step  toward bedside chair  Ambulation/Gait                 Stairs             Wheelchair Mobility    Modified Rankin (Stroke Patients Only)       Balance Overall balance assessment: Needs assistance Sitting-balance support: Feet supported Sitting balance-Leahy Scale: Good Sitting balance - Comments:  (Pt sat EOB x 10 minutes HR without change at 70bpm, no SOB)   Standing balance support: Bilateral upper extremity supported Standing balance-Leahy Scale: Fair Standing balance comment: patient required Min A for support to maintain standing balance                            Cognition Arousal/Alertness: Awake/alert Behavior During Therapy: WFL for tasks assessed/performed Overall Cognitive Status: Within Functional Limits for tasks assessed                                 General Comments: Increased motivation noted      Exercises      General Comments        Pertinent Vitals/Pain Pain Assessment: No/denies pain    Home Living                      Prior Function            PT Goals (current goals can now be found in  the care plan section) Acute Rehab PT Goals Patient Stated Goal: to go home    Frequency    Min 2X/week      PT Plan Current plan remains appropriate    Co-evaluation              AM-PAC PT "6 Clicks" Mobility   Outcome Measure  Help needed turning from your back to your side while in a flat bed without using bedrails?: A Little Help needed moving from lying on your back to sitting on the side of a flat bed without using bedrails?: A Little Help needed moving to and from a bed to a chair (including a wheelchair)?: A Little Help needed standing up from a chair using your arms (e.g., wheelchair or bedside chair)?: A Little Help needed to walk in hospital room?: A Lot Help needed climbing 3-5 steps with a railing? : A Lot 6 Click Score: 16    End of Session         PT Visit  Diagnosis: Unsteadiness on feet (R26.81);Muscle weakness (generalized) (M62.81);History of falling (Z91.81)     Time: 4136-4383 PT Time Calculation (min) (ACUTE ONLY): 43 min  Charges:  $Therapeutic Activity: 38-52 mins                     Mikel Cella, PTA   Josie Dixon 05/11/2020, 1:41 PM

## 2020-05-11 NOTE — Progress Notes (Signed)
Mobility Specialist - Progress Note   05/11/20 1300  Mobility  Activity Contraindicated/medical hold  Mobility performed by Mobility specialist    Per chart review, pt's most recent K+ levels elevated at 5.6 sitting outside safety guidelines for mobility. Will attempt session at another date as appropriate.    Kathee Delton Mobility Specialist 05/11/20, 1:02 PM

## 2020-05-11 NOTE — Progress Notes (Addendum)
Progress Note  Patient Name: Katie [name] Date of Encounter: 05/11/2020  Primary Cardiologist: New to [facility] - consult by Fletcher Anon  Subjective   Breathing about the same as yesterday. No chest pain. [place] 12/28 showed mildly elevated right and left heart filling pressures (RA 8, PCWP 20).  Mild PH noted (mean PA 29)  Cardiac output noted to be elevated with CI of 4.6.  Documented urine output of 2.5 L for the past 24 hours and a net negative 8.0 L for the admission.  No weights past 24 hours. Renal function improving. Potassium 6.2 with repeat in process.   Inpatient Medications    Scheduled Meds: . amLODipine  10 mg Oral Daily  . carvedilol  12.5 mg Oral BID WC  . Chlorhexidine Gluconate Cloth  6 each Topical Q0600  . insulin aspart  5 Units Intravenous Once   And  . dextrose  1 ampule Intravenous Once  . heparin  5,000 Units Subcutaneous Q8H  . insulin aspart  0-9 Units Subcutaneous TID WC  . polyethylene glycol  17 g Oral Daily  . senna-docusate  1 tablet Oral BID  . sodium bicarbonate  650 mg Oral TID  . sodium chloride flush  10 mL Intravenous Q12H  . sodium chloride flush  3 mL Intravenous Q12H   Continuous Infusions: . sodium chloride     PRN Meds: sodium chloride, acetaminophen **OR** acetaminophen, ALPRAZolam, bisacodyl, guaiFENesin-dextromethorphan, melatonin, ondansetron **OR** ondansetron (ZOFRAN) IV, oxyCODONE-acetaminophen, sodium chloride flush   Vital Signs    Vitals:   05/10/20 1533 05/10/20 1957 05/11/20 0448 05/11/20 0755  BP: (!) 129/59 125/60 137/64 (!) 131/59  Pulse: 65 68 71 71  Resp: 16 17 18 18   Temp: 98.4 F (36.9 C) 98.5 F (36.9 C) 98.7 F (37.1 C) 98.5 F (36.9 C)  TempSrc: Oral Oral Oral Oral  SpO2: 98% 98% 98% 98%  Weight:      Height:        Intake/Output Summary (Last 24 hours) at 05/11/2020 1012 Last data filed at 05/11/2020 0933 Gross per 24 hour  Intake 600 ml  Output 2800 ml  Net -2200 ml   Filed Weights   05/08/20 0703  05/09/20 0437 05/10/20 0027  Weight: 92 kg 88.5 kg 91.3 kg    Telemetry    Sinus rhythm, 60s bpm - Personally Reviewed  ECG    No new tracings - Personally Reviewed  Physical Exam   GEN: No acute distress.   Neck: JVD elevated approximately 10 cm. Cardiac: RRR, II/VI systolic murmur, no rubs, or gallops.  Respiratory:  Diminished breath sounds along the bilateral bases.  GI: Soft, nontender, non-distended.   MS:  Trace bilateral pretibial edema; No deformity. Neuro:  Alert and oriented x 3; Nonfocal.  Psych: Normal affect.  Labs    Chemistry Recent Labs  Lab 05/09/20 0503 05/10/20 0516 05/10/20 1224 05/11/20 0612  NA 140 141  --  140  K 4.9 5.5*  --  6.2*  CL 110 110  --  109  CO2 24 26  --  24  GLUCOSE 102* 86  --  89  BUN 94* 99*  --  96*  CREATININE 3.47* 3.69* 3.58* 3.44*  CALCIUM 7.9* 8.2*  --  8.3*  ALBUMIN 2.2* 2.3*  --  2.2*  GFRNONAA 14* 13* 14* 14*  ANIONGAP 6 5  --  7     Hematology Recent Labs  Lab 05/05/20 0715 05/10/20 1224  WBC 6.3 5.2  RBC 3.10* 2.82*  HGB 9.7* 8.8*  HCT 30.0* 28.1*  MCV 96.8 99.6  MCH 31.3 31.2  MCHC 32.3 31.3  RDW 13.6 13.3  PLT 137* 175    Cardiac EnzymesNo results for input(s): TROPONINI in the last 168 hours. No results for input(s): TROPIPOC in the last 168 hours.   BNPNo results for input(s): BNP, PROBNP in the last 168 hours.   DDimer No results for input(s): DDIMER in the last 168 hours.   Radiology    DG Chest 2 View  Result Date: 05/08/2020 IMPRESSION: Cardiomegaly with bibasilar collapse/consolidative opacity and small to moderate bilateral pleural effusions. Interval increase in right pleural fluid volume. Electronically Signed   By: Misty Stanley M.D.   On: 05/08/2020 07:53    Cardiac Studies   2D echo 05/01/2020: 1. Left ventricular ejection fraction, by estimation, is 55 to 60%. The  left ventricle has normal function. The left ventricle has no regional  wall motion abnormalities. There  is severe concentric left ventricular  hypertrophy. Left ventricular diastolic  parameters are consistent with Grade II diastolic dysfunction  (pseudonormalization).  2. Right ventricular systolic function is moderately reduced. The right  ventricular size is normal. Mildly increased right ventricular wall  thickness. There is moderately elevated pulmonary artery systolic  pressure.  3. Left atrial size was mildly dilated.  4. Right atrial size was mildly dilated.  5. Moderate pericardial effusion. The pericardial effusion is  circumferential.  6. The mitral valve is abnormal. Mild to moderate mitral valve  regurgitation.  7. The tricuspid valve is abnormal. Tricuspid valve regurgitation is mild  to moderate.  8. The aortic valve is abnormal. Aortic valve regurgitation is mild to  moderate. Mild to moderate aortic valve stenosis. __________  RHC 05/10/2020: Conclusions: 1. Mildly elevated left heart, right heart, and pulmonary artery pressures. 2. Normal to supranormal Fick cardiac output/index.  Recommendations: 1. Continue gentle diuresis. 2. Consider further workup of potential causes of high-output heart failure.   Right Heart Pressures RA (mean): 10 mmHg RV (S/EDP): 48/12 mmHg PA (S/D, mean): 48/20 (29) mmHg PCWP (mean): 20 mmHg  Ao sat: 94% PA sat: 76%  Fick CO: 9.3 L/min Fick CI: 4.6 L/min/m^2    Patient Profile     64 y.o. female with history of HFpEF, CKD stage IV, and HTN who presented with increasing shortness of breath and edema who we are seeing for acute on chronic HFpEF and pulmonary hypertension.  Assessment & Plan    1.  Acute on chronic HFpEF/high output heart failure/pulmonary hypertension/pleural and pericardial effusions: -Dyspnea is improving though does persist -Status post right-sided thoracentesis on 05/02/2020 with 1.6 L of fluid removed with cytology being negative for evidence of malignancy with pleural effusion consistent with  transudate -RHC with mildly elevated right and left heart filling pressures as above with elevated cardiac output -Etiologies of high output heart failure should be considered, though this is suspected to be in the setting of chronic anemia, obesity, and underlying lung disease  -IV Lasix 40 mg twice daily -Strict I/O -Daily weights  2.  HTN: -Overall, BP has been reasonably controlled -IV Lasix as above -Amlodipine, carvedilol, hydralazine  3.  Mitral valve regurgitation/aortic valve insufficiency/aortic valve stenosis: -Valvular heart disease mild to moderate on echo this admission -Follow-up as outpatient  4.  Acute on CKD stage IV: -Improving  -Nephrology following, may ultimately need dialysis -Close monitoring of renal function with rechallenge of IV Lasix as above  5. Hyperkalemia: -Repeat pending   For questions or  updates, please contact [place] Please consult [email or web address] for contact info under Cardiology/STEMI.    Signed, Christell Faith, PA-C [facility] Pager: [phone] 05/11/2020, 10:12 AM

## 2020-05-11 NOTE — Op Note (Signed)
  OPERATIVE NOTE   PROCEDURE: 1. Ultrasound guidance for vascular access right femoral vein 2. Placement of a 30 cm triple-lumen dialysis catheter right femoral vein  PRE-OPERATIVE DIAGNOSIS: 1. Acute renal failure 2.  Stage IV chronic kidney disease  POST-OPERATIVE DIAGNOSIS: Same  SURGEON: Leotis Pain, MD  ASSISTANT(S): None  ANESTHESIA: local  ESTIMATED BLOOD LOSS: Minimal   FINDING(S): 1. None  SPECIMEN(S): None  INDICATIONS:  Patient is a 65 y.o.female who presents with worsening renal failure on top of chronic kidney disease and now needs dialysis.  We are asked to place a temporary dialysis catheter by the nephrology service.  Risks and benefits were discussed, and informed consent was obtained..  DESCRIPTION: After obtaining full informed written consent, the patient was laid flat in the bed. The right groin was sterilely prepped and draped in a sterile surgical field was created. The right femoral vein was visualized with ultrasound and found to be widely patent. It was then accessed under direct guidance without difficulty with a Seldinger needle and a permanent image was recorded. A J-wire was then placed. After skin nick and dilatation, a 30 cm triple-lumen dialysis catheter was placed over the wire and the wire was removed. The lumens withdrew dark red nonpulsatile blood and flushed easily with sterile saline. The catheter was secured to the skin with 3 nylon sutures. Sterile dressing was placed.  COMPLICATIONS: None  CONDITION: Stable  Leotis Pain 05/11/2020 3:16 PM  This note was created with Dragon Medical transcription system. Any errors in dictation are purely unintentional.

## 2020-05-12 ENCOUNTER — Encounter: Payer: Self-pay | Admitting: Vascular Surgery

## 2020-05-12 DIAGNOSIS — N170 Acute kidney failure with tubular necrosis: Secondary | ICD-10-CM

## 2020-05-12 DIAGNOSIS — E1122 Type 2 diabetes mellitus with diabetic chronic kidney disease: Secondary | ICD-10-CM

## 2020-05-12 DIAGNOSIS — R0600 Dyspnea, unspecified: Secondary | ICD-10-CM

## 2020-05-12 DIAGNOSIS — N185 Chronic kidney disease, stage 5: Secondary | ICD-10-CM

## 2020-05-12 LAB — RENAL FUNCTION PANEL
Albumin: 2.2 g/dL — ABNORMAL LOW (ref 3.5–5.0)
Anion gap: 7 (ref 5–15)
BUN: 77 mg/dL — ABNORMAL HIGH (ref 8–23)
CO2: 26 mmol/L (ref 22–32)
Calcium: 7.9 mg/dL — ABNORMAL LOW (ref 8.9–10.3)
Chloride: 108 mmol/L (ref 98–111)
Creatinine, Ser: 2.94 mg/dL — ABNORMAL HIGH (ref 0.44–1.00)
GFR, Estimated: 17 mL/min — ABNORMAL LOW (ref >60)
Glucose, Bld: 97 mg/dL (ref 70–99)
Phosphorus: 5.3 mg/dL — ABNORMAL HIGH (ref 2.5–4.6)
Potassium: 5.2 mmol/L — ABNORMAL HIGH (ref 3.5–5.1)
Sodium: 141 mmol/L (ref 135–145)

## 2020-05-12 LAB — MAGNESIUM: Magnesium: 1.8 mg/dL (ref 1.7–2.4)

## 2020-05-12 LAB — HEPATITIS B SURFACE ANTIGEN: Hepatitis B Surface Ag: NONREACTIVE

## 2020-05-12 LAB — GLUCOSE, CAPILLARY
Glucose-Capillary: 77 mg/dL (ref 70–99)
Glucose-Capillary: 110 mg/dL — ABNORMAL HIGH (ref 70–99)
Glucose-Capillary: 134 mg/dL — ABNORMAL HIGH (ref 70–99)

## 2020-05-12 LAB — PHOSPHORUS: Phosphorus: 4.8 mg/dL — ABNORMAL HIGH (ref 2.5–4.6)

## 2020-05-12 MED ORDER — LIDOCAINE HCL (PF) 1 % IJ SOLN
5.0000 mL | INTRAMUSCULAR | Status: DC | PRN
  Filled 2020-05-12: qty 5

## 2020-05-12 MED ORDER — ALTEPLASE 2 MG IJ SOLR: 2.0000 mg | Freq: Once | INTRAMUSCULAR | Status: DC | PRN

## 2020-05-12 MED ORDER — SODIUM CHLORIDE 0.9 % IV SOLN: 100.0000 mL | INTRAVENOUS | Status: DC | PRN

## 2020-05-12 MED ORDER — PENTAFLUOROPROP-TETRAFLUOROETH EX AERO
1.0000 "application " | INHALATION_SPRAY | CUTANEOUS | Status: DC | PRN
  Filled 2020-05-12: qty 30

## 2020-05-12 MED ORDER — PROMETHAZINE HCL 25 MG/ML IJ SOLN
12.5000 mg | Freq: Three times a day (TID) | INTRAMUSCULAR | Status: DC | PRN
  Administered 2020-05-12 – 2020-05-13 (×2): 12.5 mg via INTRAVENOUS

## 2020-05-12 MED ORDER — NEPRO/CARBSTEADY PO LIQD
237.0000 mL | Freq: Two times a day (BID) | ORAL | Status: DC
  Administered 2020-05-12 – 2020-05-17 (×4): 237 mL via ORAL

## 2020-05-12 MED ORDER — HEPARIN SODIUM (PORCINE) 1000 UNIT/ML DIALYSIS
1000.0000 [IU] | INTRAMUSCULAR | Status: DC | PRN
  Filled 2020-05-12: qty 1

## 2020-05-12 MED ORDER — LIDOCAINE-PRILOCAINE 2.5-2.5 % EX CREA
1.0000 "application " | TOPICAL_CREAM | CUTANEOUS | Status: DC | PRN
  Filled 2020-05-12: qty 5

## 2020-05-12 MED ORDER — RENA-VITE PO TABS
1.0000 | ORAL_TABLET | Freq: Every day | ORAL | Status: DC
  Administered 2020-05-12 – 2020-07-01 (×49): 1 via ORAL
  Filled 2020-05-12 (×55): qty 1

## 2020-05-12 NOTE — Progress Notes (Signed)
PT completed  2nd treatment with mild complaint of Nausea.Given 12.5 Phenergan. No fluid removed. Dressing changed but Katie [name], CCHT. Lims Heparin locked and capped   15:45  Floor nurse called  To report site bleeding and  Asked if Dialysis nurse  Could talke a look. RN only one in the suite with patients and unable to go to bedside. Requested they called vascular.  1600  Floor Nurse came down and given two dressing kits for  Site to be redresses    Katie [name] L Katie [name] 05/12/2020, 5:38 PM  Checked bak in with the floor Nurse wo reproted site was looked at by vascular and redressing  Stated it was not actively bleeding at this time.

## 2020-05-12 NOTE — Progress Notes (Signed)
Mobility Specialist - Progress Note   05/12/20 1600  Mobility  Activity Contraindicated/medical hold  Mobility performed by Mobility specialist    Per discussion with nurse, mobility session on hold this date d/t K+ levels currently 5.2, glucose 77, and bleeding in dialysis access site earlier today. Will continue to monitor and attempt session when appropriate.    Kathee Delton Mobility Specialist 05/12/20, 4:43 PM

## 2020-05-12 NOTE — Progress Notes (Signed)
[place], [name] 05/12/20  Subjective:   Hospital day # 11 Appreciate vascular surgery assistance. Temporary right femoral dialysis catheter was placed. Patient underwent 1st dialysis treatment yesterday and 2nd Alysis treatment today.  Renal: 12/29 0701 - 12/30 0700 In: 840 [P.O.:840] Out: 2100 [Urine:2100] Lab Results  Component Value Date   CREATININE 2.94 (H) 05/12/2020   CREATININE 3.44 (H) 05/11/2020   CREATININE 3.58 (H) 05/10/2020     Objective:  Vital signs in last 24 hours:  Temp:  [98 F (36.7 C)-99.4 F (37.4 C)] 99.1 F (37.3 C) (12/30 1349) Pulse Rate:  [65-73] 73 (12/30 1349) Resp:  [9-19] 16 (12/30 1349) BP: (113-151)/(60-71) 151/70 (12/30 1349) SpO2:  [91 %-99 %] 91 % (12/30 1349) Weight:  [90.1 kg-90.8 kg] 90.1 kg (12/30 0459)  Weight change:  Filed Weights   05/11/20 1300 05/11/20 2000 05/12/20 0459  Weight: 90 kg 90.8 kg 90.1 kg    Intake/Output:    Intake/Output Summary (Last 24 hours) at 05/12/2020 1612 Last data filed at 05/12/2020 1245 Gross per 24 hour  Intake 480 ml  Output 1300 ml  Net -820 ml     Physical Exam: General:  Chronically ill-appearing, laying in the bed  HEENT  anicteric, moist oral mucous membranes  Pulm/lungs  normal breathing effort  CVS/Heart  regular, no rub  Abdomen:   Soft, nontender  Extremities: + Dependent edema  Neurologic:  Alert, able to answer questions  Skin:  No acute rashes  Access: Right femoral temporary dialysis catheter.  Basic Metabolic Panel:  Recent Labs  Lab 05/08/20 0518 05/09/20 0503 05/10/20 0516 05/10/20 1224 05/11/20 0612 05/11/20 0949 05/12/20 0447 05/12/20 0946  NA 140 140 141  --  140  --  141  --   K 4.5 4.9 5.5*  --  6.2* 5.6* 5.2*  --   CL 110 110 110  --  109  --  108  --   CO2 $Re'22 24 26  'FSo$ --  24  --  26  --   GLUCOSE 79 102* 86  --  89  --  97  --   BUN 97* 94* 99*  --  96*  --  77*  --   CREATININE 3.50* 3.47* 3.69* 3.58* 3.44*   --  2.94*  --   CALCIUM 7.9* 7.9* 8.2*  --  8.3*  --  7.9*  --   MG 1.9 1.9 2.0  --  2.1  --  1.8  --   PHOS 6.5* 7.0* 7.1*  --  6.6*  --  5.3* 4.8*     CBC: Recent Labs  Lab 05/10/20 1224  WBC 5.2  HGB 8.8*  HCT 28.1*  MCV 99.6  PLT 175      Lab Results  Component Value Date   HEPBSAG NON REACTIVE 05/11/2020      Microbiology:  Recent Results (from the past 240 hour(s))  Body fluid culture     Status: None   Collection Time: 05/02/20  4:44 PM   Specimen: PATH Cytology Pleural fluid  Result Value Ref Range Status   Specimen Description   Final    PLEURAL Performed at [facility], [address] [place], [place] [address]    Special Requests   Final    NONE Performed at [facility], [address] [place], [name] [address]    Gram Stain   Final    MODERATE WBC PRESENT, PREDOMINANTLY MONONUCLEAR NO ORGANISMS SEEN  Culture   Final    NO GROWTH Performed at [facility], [place] [address], [place], [place] [address]    Report Status 05/06/2020 FINAL  Final    Coagulation Studies: No results for input(s): LABPROT, INR in the last 72 hours.  Urinalysis: No results for input(s): COLORURINE, LABSPEC, PHURINE, GLUCOSEU, HGBUR, BILIRUBINUR, KETONESUR, PROTEINUR, UROBILINOGEN, NITRITE, LEUKOCYTESUR in the last 72 hours.  Invalid input(s): APPERANCEUR    Imaging: PERIPHERAL VASCULAR CATHETERIZATION  Result Date: 05/11/2020 See op note    Medications:   . sodium chloride     . amLODipine  10 mg Oral Daily  . carvedilol  12.5 mg Oral BID WC  . Chlorhexidine Gluconate Cloth  6 each Topical Q0600  . feeding supplement (NEPRO CARB STEADY)  237 mL Oral BID BM  . furosemide  40 mg Intravenous Q12H  . heparin  5,000 Units Subcutaneous Q8H  . insulin aspart  0-9 Units Subcutaneous TID WC  . multivitamin  1 tablet Oral QHS  . polyethylene glycol  17 g Oral Daily  . senna-docusate  1 tablet Oral BID  . sodium bicarbonate  650  mg Oral TID  . sodium chloride flush  10 mL Intravenous Q12H  . sodium chloride flush  3 mL Intravenous Q12H   sodium chloride, acetaminophen **OR** acetaminophen, ALPRAZolam, bisacodyl, guaiFENesin-dextromethorphan, melatonin, ondansetron **OR** ondansetron (ZOFRAN) IV, oxyCODONE-acetaminophen, promethazine, sodium chloride flush  Assessment/ Plan:  64 y.o. female with heart failure with preserved EF, hypertension, CKD, stroke   admitted on 04/30/2020 for Chronic kidney disease (CKD), stage IV (severe) (HCC) [N18.4] Volume overload [E87.70] #AKI with chronic kidney disease stage IV/hyperkalemia Current creatinine of 3.5, GFR 14 Awaiting records from previous nephrology evaluation in [place].   Renal ultrasound shows echogenic kidneys Hemoglobin A1c 4.7% May 01, 2020 Underlying CKD likely secondary to hypertension and atherosclerosis Creatinine clearance came back low at 9. Creatinine 3.4 EGFR 14 with BUN of 96. -Patient initiated on hemodialysis treatment yesterday.  Tolerated well.  3rd treatment tomorrow.  #Proteinuria Urine protein to creatinine ratio 5.35 g May 03, 2020 Serological studies May 04, 2020: ANA negative, ANCA negative, kappa lambda ratio 1.6 Will consider ACE inhibitor/ARB once serum creatinine is determined to be stable  #Severe hypertension, lower extremity edema 2D echo with LVEF 55 to 60%, moderately elevated pulmonary artery systolic pressures, moderate pericardial effusion, Mild to moderate aortic stenosiscurrently managed with torsemide as needed -Continue amlodipine and carvedilol.   LOS: 11 Katie [name] 12/30/20214:12 PM  [place], [place]  Note: This note was prepared with Dragon dictation. Any transcription errors are unintentional

## 2020-05-12 NOTE — Progress Notes (Signed)
Progress Note  Patient Name: Katie [name] Date of Encounter: 05/12/2020  Primary Cardiologist: New to [facility] - consult by Fletcher Anon  Subjective   Seen in HD. Breathing a little better than yesterday. No chest pain. Had temporary dialysis catheter placed 12/29. Renal function improving currently. Hyperkalemia improving, unable to receive Veltassa or Lokelma in the setting of no BM x 1 week.  Documented urine output of 1.2 L for the past 24 hours and a net negative 9.6 L for the admission.  Weight 90.8-->90.1 kg.  [place] 12/28 showed mildly elevated right and left heart filling pressures (RA 8, PCWP 20).  Mild PH noted (mean PA 29)  Cardiac output noted to be elevated with CI of 4.6.      Inpatient Medications    Scheduled Meds: . amLODipine  10 mg Oral Daily  . carvedilol  12.5 mg Oral BID WC  . Chlorhexidine Gluconate Cloth  6 each Topical Q0600  . furosemide  40 mg Intravenous Q12H  . heparin  5,000 Units Subcutaneous Q8H  . insulin aspart  0-9 Units Subcutaneous TID WC  . polyethylene glycol  17 g Oral Daily  . senna-docusate  1 tablet Oral BID  . sodium bicarbonate  650 mg Oral TID  . sodium chloride flush  10 mL Intravenous Q12H  . sodium chloride flush  3 mL Intravenous Q12H   Continuous Infusions: . sodium chloride     PRN Meds: sodium chloride, acetaminophen **OR** acetaminophen, ALPRAZolam, bisacodyl, guaiFENesin-dextromethorphan, melatonin, ondansetron **OR** ondansetron (ZOFRAN) IV, oxyCODONE-acetaminophen, sodium chloride flush   Vital Signs    Vitals:   05/11/20 1930 05/11/20 2000 05/12/20 0308 05/12/20 0459  BP: 122/61  (!) 151/62   Pulse: 65  70   Resp: 14  19   Temp:   98.9 F (37.2 C)   TempSrc:   Oral   SpO2: 93%  99%   Weight:  90.8 kg  90.1 kg  Height:        Intake/Output Summary (Last 24 hours) at 05/12/2020 0744 Last data filed at 05/12/2020 0306 Gross per 24 hour  Intake 840 ml  Output 2100 ml  Net -1260 ml   Filed Weights   05/11/20 1300  05/11/20 2000 05/12/20 0459  Weight: 90 kg 90.8 kg 90.1 kg    Telemetry    Not on telemetry currently - Personally Reviewed  ECG    No new tracings - Personally Reviewed  Physical Exam   GEN: No acute distress.   Neck: JVD flat. Cardiac: RRR, II/VI systolic murmur, no rubs, or gallops.  Respiratory:  Diminished breath sounds along the bilateral bases.  GI: Soft, nontender, non-distended.   MS:  Trace bilateral pretibial edema; No deformity. Neuro:  Alert and oriented x 3; Nonfocal.  Psych: Normal affect.  Labs    Chemistry Recent Labs  Lab 05/10/20 0516 05/10/20 1224 05/11/20 0612 05/11/20 0949 05/12/20 0447  NA 141  --  140  --  141  K 5.5*  --  6.2* 5.6* 5.2*  CL 110  --  109  --  108  CO2 26  --  24  --  26  GLUCOSE 86  --  89  --  97  BUN 99*  --  96*  --  77*  CREATININE 3.69* 3.58* 3.44*  --  2.94*  CALCIUM 8.2*  --  8.3*  --  7.9*  ALBUMIN 2.3*  --  2.2*  --  2.2*  GFRNONAA 13* 14* 14*  --  [place] 5  --  7  --  7     Hematology Recent Labs  Lab 05/10/20 1224  WBC 5.2  RBC 2.82*  HGB 8.8*  HCT 28.1*  MCV 99.6  MCH 31.2  MCHC 31.3  RDW 13.3  PLT 175    Cardiac EnzymesNo results for input(s): TROPONINI in the last 168 hours. No results for input(s): TROPIPOC in the last 168 hours.   BNPNo results for input(s): BNP, PROBNP in the last 168 hours.   DDimer No results for input(s): DDIMER in the last 168 hours.   Radiology    DG Chest 2 View  Result Date: 05/08/2020 IMPRESSION: Cardiomegaly with bibasilar collapse/consolidative opacity and small to moderate bilateral pleural effusions. Interval increase in right pleural fluid volume. Electronically Signed   By: Misty Stanley M.D.   On: 05/08/2020 07:53    Cardiac Studies   2D echo 05/01/2020: 1. Left ventricular ejection fraction, by estimation, is 55 to 60%. The  left ventricle has normal function. The left ventricle has no regional  wall motion abnormalities. There is severe  concentric left ventricular  hypertrophy. Left ventricular diastolic  parameters are consistent with Grade II diastolic dysfunction  (pseudonormalization).  2. Right ventricular systolic function is moderately reduced. The right  ventricular size is normal. Mildly increased right ventricular wall  thickness. There is moderately elevated pulmonary artery systolic  pressure.  3. Left atrial size was mildly dilated.  4. Right atrial size was mildly dilated.  5. Moderate pericardial effusion. The pericardial effusion is  circumferential.  6. The mitral valve is abnormal. Mild to moderate mitral valve  regurgitation.  7. The tricuspid valve is abnormal. Tricuspid valve regurgitation is mild  to moderate.  8. The aortic valve is abnormal. Aortic valve regurgitation is mild to  moderate. Mild to moderate aortic valve stenosis. __________  RHC 05/10/2020: Conclusions: 1. Mildly elevated left heart, right heart, and pulmonary artery pressures. 2. Normal to supranormal Fick cardiac output/index.  Recommendations: 1. Continue gentle diuresis. 2. Consider further workup of potential causes of high-output heart failure.   Right Heart Pressures RA (mean): 10 mmHg RV (S/EDP): 48/12 mmHg PA (S/D, mean): 48/20 (29) mmHg PCWP (mean): 20 mmHg  Ao sat: 94% PA sat: 76%  Fick CO: 9.3 L/min Fick CI: 4.6 L/min/m^2    Patient Profile     64 y.o. female with history of HFpEF, CKD stage IV, and HTN who presented with increasing shortness of breath and edema who we are seeing for acute on chronic HFpEF and pulmonary hypertension.  Assessment & Plan    1.  Acute on chronic HFpEF/high output heart failure/pulmonary hypertension/pleural and pericardial effusions: -Dyspnea is improving though does persist -Status post right-sided thoracentesis on 05/02/2020 with 1.6 L of fluid removed with cytology being negative for evidence of malignancy with pleural effusion consistent with  transudate -RHC with mildly elevated right and left heart filling pressures as above with elevated cardiac output -Etiologies of high output heart failure should be considered, though this is suspected to be in the setting of chronic anemia, obesity, and underlying lung disease  -IV Lasix 40 mg twice daily, may ultimately need dialysis  -Strict I/O -Daily weights  2.  HTN: -Overall, BP has been reasonably controlled -IV Lasix as above -Amlodipine, carvedilol, hydralazine  3.  Mitral valve regurgitation/aortic valve insufficiency/aortic valve stenosis: -Valvular heart disease mild to moderate on echo this admission -Follow-up as outpatient  4.  Acute on CKD stage IV: -Improving  -  Nephrology following, may ultimately need dialysis, temporary dialysis catheter placed 12/29 -Close monitoring of renal function with rechallenge of IV Lasix as above  5. Hyperkalemia: -Improving -Unable to receive Veltassa/Lokelma without BM x 1 week   For questions or updates, please contact [place] Please consult [email or web address] for contact info under Cardiology/STEMI.    Signed, Christell Faith, PA-C [place] Pager: [phone] 05/12/2020, 7:44 AM

## 2020-05-12 NOTE — Progress Notes (Signed)
Initial Nutrition Assessment  DOCUMENTATION CODES:   Obesity unspecified  INTERVENTION:   Nepro Shake po BID, each supplement provides 425 kcal and 19 grams protein  Rena-vit po daily  NUTRITION DIAGNOSIS:   Increased nutrient needs related to chronic illness (CKD IV with temp HD, CHF) as evidenced by estimated needs.  GOAL:   Patient will meet greater than or equal to 90% of their needs  MONITOR:   PO intake,Supplement acceptance,Labs,Weight trends,Skin,I & O's  REASON FOR ASSESSMENT:   LOS    ASSESSMENT:   64 y/o female with h/o CKD IV, CHF, DM and [place] who is admitted with AKI and volume overload.  Pt s/p temporary HD cath 12/29  RD working remotely.  Unable to reach pt by phone. Per chart, with good appetite and oral intake in hospital; pt eating 100% of meals. RD will add supplements and rena-vit to help pt meet her estimated needs and replace losses from HD. Per chart, pt appears weight stable since admit. RD will obtain nutrition related history and exam at follow-up.   Medications reviewed and include: lasix, heparin, insulin, miralax, senokot, Na bicarbonate   Labs reviewed: K 5.2(H), BUN 77(H), creat 2.94(H), P 4.8(H), Mg 1.8 wnl Hgb 8.8(L), Hct 28.1(L)  NUTRITION - FOCUSED PHYSICAL EXAM: Unable to perform at this time   Diet Order:   Diet Order            Diet renal/carb modified with fluid restriction Diet-HS Snack? Nothing; Fluid restriction: 1200 mL Fluid; Room service appropriate? Yes; Fluid consistency: Thin  Diet effective now                EDUCATION NEEDS:   Education needs have been addressed  Skin:  Skin Assessment: Reviewed RN Assessment (ecchymosis, Stage II coccyx)  Last BM:  12/29- type 4  Height:   Ht Readings from Last 1 Encounters:  04/30/20 5\' 6"  (1.676 m)    Weight:   Wt Readings from Last 1 Encounters:  05/12/20 90.1 kg    Ideal Body Weight:  59 kg  BMI:  Body mass index is 32.06 kg/m.  Estimated  Nutritional Needs:   Kcal:  1900-2200kcal/day  Protein:  95-110g/day  Fluid:  UOP +1L  [name] MS, RD, LDN Please refer to [facility] for RD and/or RD on-call/weekend/after hours pager

## 2020-05-12 NOTE — Progress Notes (Signed)
Pt dialysis access bleeding. T/C to dialysis nurse. Who stated that I should probably call vascular. T/C to vascular. Vascular team came up to see the patient. Vascular took off the dressing and informed me that the site will bleed a little bit and for me to re-dress the site. Sterile dressing placed on the dialysis site.

## 2020-05-12 NOTE — Progress Notes (Signed)
PROGRESS NOTE    Katie [name]  QZR:[id number] DOB: [birth year] DOA: 04/30/2020 PCP: Patient, [name]   Brief Narrative: Katie [name] is a 64 y.o. female with a history of diabetes mellitus type 2, stage IV CKD, heart failure, hypertension. Patient presented secondary to dyspnea with evidence of volume overload and pleural effusion.   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 Cardiomegaly on chest x-ray. Started on Lasix IV on admission but has now progressed to requiring hemodialysis. She has not had much weight change with IV lasix diuresis.   Pleural effusion In setting of fluid overload with new diagnosis of grade 2 diastolic heart failure. No previous history of pleural effusion. Diagnostic right thoracentesis performed on 12/20. No evidence of malignancy on cytology  Diabetes mellitus, type 2 Hemoglobin A1C of 4.7%. No need for pharmacologic treatment. Diet control.  CKD stage V Unknown baseline. Patient is unsure of her primary nephrologist in [place]. Creatinine of 2.97 on admission. Per patient, she was planned to start hemodialysis in addition to consideration for renal transplant. Nephrology consulted on 12/20. Worsening renal function in setting of attempted Lasix diuresis for heart failure. Nephrology now recommending initiation of hemodialysis. -Nephrology recommendations: HD started 35/45  Acute diastolic heart failure New diagnosis. Grade 2 dysfunction. Normal EF. Associated RV systolic dysfunction with elevated PA pressure seen  on Transthoracic Echocardiogram although RHC on 12/28 significant for mildly elevated right heart/pulmonary artery pressures. Weight down 1.6 kg from admission. Net IO Since Admission: -9,445 mL [05/12/20 1050] -Cardiology Recommendations: Lasix 40 mg IV -Daily weights/strict in and out  Moderate pericardial effusion No clinical evidence of tamponade. No recent infections. In setting of CKD V with elevated BUN. No chest pain. -Cardiology recommendation: outpatient Transthoracic Echocardiogram for surveillance  Mild/moderate aortic stenosis Unsure if this is symptomatic. Patient does have dyspnea with exertion but this may be multifactorial. -Cardiology recommendations: outpatient Transthoracic Echocardiogram for surveillance  Primary hypertension Patient is on at least hydrochlorothiazide as an outpatient but is unsure of her regimen. Currently uncontrolled. -Amlodipine 10 mg daily and Coreg 12.5 mg BID  Anemia Possible related to underlying kidney disease. Evidence of macrocytosis on CBC. TIBC low but otherwise normal iron panel. Vitamin B12 and folate normal.  Hyperkalemia In setting of CKD. Lokelma 10 g given with improvement.  Pressure injury Mid coccyx, POA  History of tobacco use Patient has quit for at least 10 years. Previously, 20 pack year history.  Headache Normal chronic headache. Patient states she takes Imitrex as needed. Resolved.  Obesity Body mass index is 32.06 kg/m.   DVT prophylaxis: Lovenox Code Status:   Code Status: Full Code Family Communication: None at bedside Disposition Plan: Discharge SNF likely in 3+ days pending diuresis.   Consultants:   Nephrology  Cardiology  Vascular surgery  Procedures:   TRANSTHORACIC ECHOCARDIOGRAM (05/01/2020) IMPRESSIONS    1. Left ventricular ejection fraction, by estimation, is 55 to 60%. The  left ventricle has normal function. The left ventricle has no regional  wall motion abnormalities. There is  severe concentric left ventricular  hypertrophy. Left ventricular diastolic  parameters are consistent with Grade II diastolic dysfunction  (pseudonormalization).  2. Right ventricular systolic function is moderately reduced. The right  ventricular size is normal. Mildly increased right ventricular wall  thickness. There is moderately elevated pulmonary artery systolic  pressure.  3. Left atrial size was mildly dilated.  4. Right atrial size was mildly dilated.  5. Moderate pericardial effusion. The pericardial effusion is  circumferential.  6. The mitral valve is abnormal. Mild to moderate mitral valve  regurgitation.  7. The tricuspid valve is abnormal. Tricuspid valve regurgitation is mild  to moderate.  8. The aortic valve is abnormal. Aortic valve regurgitation is mild to  moderate. Mild to moderate aortic valve stenosis.  RIGHT HEART CATHETERIZATION (05/10/2020) Conclusions: 1. Mildly elevated left heart, right heart, and pulmonary artery pressures. 2. Normal to supranormal Fick cardiac output/index.  Recommendations: 1. Continue gentle diuresis. 2. Consider further workup of potential causes of high-output heart failure.   Antimicrobials:  None    Subjective: No concerns today. Breathing well.  Objective: Vitals:   05/12/20 1011 05/12/20 1015 05/12/20 1030 05/12/20 1045  BP:  140/63 132/64 136/69  Pulse:  70 67 70  Resp:  13 12 10   Temp: 99.4 F (37.4 C)     TempSrc: Oral     SpO2:      Weight:      Height:        Intake/Output Summary (Last 24 hours) at 05/12/2020 1050 Last data filed at 05/12/2020 0945 Gross per 24 hour  Intake 480 ml  Output 2100 ml  Net -1620 ml   Filed Weights   05/11/20 1300 05/11/20 2000 05/12/20 0459  Weight: 90 kg 90.8 kg 90.1 kg    Examination:  General exam: Appears calm and comfortable Respiratory system: Clear to auscultation. Respiratory effort normal. Cardiovascular system: S1 & S2 heard, RRR. 2/6  systolic murmur Gastrointestinal system: Abdomen is nondistended, soft and nontender. No organomegaly or masses felt. Normal bowel sounds heard. Central nervous system: Alert and oriented. No focal neurological deficits. Musculoskeletal: 2+ BLE pitting edema. No calf tenderness Skin: No cyanosis. No rashes Psychiatry: Judgement and insight appear normal. Mood & affect appropriate.   Data Reviewed: I have personally reviewed following labs and imaging studies  CBC Lab Results  Component Value Date   WBC 5.2 05/10/2020   RBC 2.82 (L) 05/10/2020   HGB 8.8 (L) 05/10/2020   HCT 28.1 (L) 05/10/2020   MCV 99.6 05/10/2020   MCH 31.2 05/10/2020   PLT 175 05/10/2020   MCHC 31.3 05/10/2020   RDW 13.3 97/98/9211     Last metabolic panel Lab Results  Component Value Date   NA 141 05/12/2020   K 5.2 (H) 05/12/2020   CL 108 05/12/2020   CO2 26 05/12/2020   BUN 77 (H) 05/12/2020   CREATININE 2.94 (H) 05/12/2020   GLUCOSE 97 05/12/2020   GFRNONAA 17 (L) 05/12/2020   CALCIUM 7.9 (L) 05/12/2020   PHOS 5.3 (H) 05/12/2020   PROT 5.6 (L) 05/02/2020   ALBUMIN 2.2 (L) 05/12/2020   LABGLOB 1.9 (L) 05/04/2020   AGRATIO 1.1 05/04/2020   BILITOT 0.9 04/30/2020   ALKPHOS 68 04/30/2020   AST 20 04/30/2020   ALT 26 04/30/2020   ANIONGAP 7 05/12/2020    CBG (last 3)  Recent Labs    05/11/20 1618 05/11/20 2132 05/12/20 0825  GLUCAP 79 124* 77     GFR: Estimated Creatinine Clearance: 22.4 mL/min (  A) (by C-G formula based on SCr of 2.94 mg/dL (H)).  Coagulation Profile: No results for input(s): INR, PROTIME in the last 168 hours.  Recent Results (from the past 240 hour(s))  Body fluid culture     Status: None   Collection Time: 05/02/20  4:44 PM   Specimen: PATH Cytology Pleural fluid  Result Value Ref Range Status   Specimen Description   Final    PLEURAL Performed at [facility], [address] [place], [place] [address]    Special Requests   Final     NONE Performed at [facility], [address] [place], [place] [address]    Gram Stain   Final    MODERATE WBC PRESENT, PREDOMINANTLY MONONUCLEAR NO ORGANISMS SEEN    Culture   Final    NO GROWTH Performed at [place] [address] [place], [place] [address]    Report Status 05/06/2020 FINAL  Final        Radiology Studies: CARDIAC CATHETERIZATION  Result Date: 05/10/2020 Conclusions: 1. Mildly elevated left heart, right heart, and pulmonary artery pressures. 2. Normal to supranormal Fick cardiac output/index. Recommendations: 1. Continue gentle diuresis. 2. Consider further workup of potential causes of high-output heart failure. Nelva Bush, MD [facility]   PERIPHERAL VASCULAR CATHETERIZATION  Result Date: 05/11/2020 See op note       Scheduled Meds: . amLODipine  10 mg Oral Daily  . carvedilol  12.5 mg Oral BID WC  . Chlorhexidine Gluconate Cloth  6 each Topical Q0600  . furosemide  40 mg Intravenous Q12H  . heparin  5,000 Units Subcutaneous Q8H  . insulin aspart  0-9 Units Subcutaneous TID WC  . polyethylene glycol  17 g Oral Daily  . senna-docusate  1 tablet Oral BID  . sodium bicarbonate  650 mg Oral TID  . sodium chloride flush  10 mL Intravenous Q12H  . sodium chloride flush  3 mL Intravenous Q12H   Continuous Infusions: . sodium chloride    . sodium chloride    . sodium chloride       LOS: 11 days     Cordelia Poche, MD [facility] 05/12/2020, 10:50 AM  If 7PM-7AM, please contact night-coverage [email or web address]

## 2020-05-12 NOTE — Plan of Care (Signed)

## 2020-05-13 DIAGNOSIS — I272 Pulmonary hypertension, unspecified: Secondary | ICD-10-CM

## 2020-05-13 LAB — RENAL FUNCTION PANEL
Albumin: 2.4 g/dL — ABNORMAL LOW (ref 3.5–5.0)
Anion gap: 6 (ref 5–15)
BUN: 53 mg/dL — ABNORMAL HIGH (ref 8–23)
CO2: 28 mmol/L (ref 22–32)
Calcium: 8.1 mg/dL — ABNORMAL LOW (ref 8.9–10.3)
Chloride: 107 mmol/L (ref 98–111)
Creatinine, Ser: 2.41 mg/dL — ABNORMAL HIGH (ref 0.44–1.00)
GFR, Estimated: 22 mL/min — ABNORMAL LOW (ref >60)
Glucose, Bld: 92 mg/dL (ref 70–99)
Phosphorus: 4.6 mg/dL (ref 2.5–4.6)
Potassium: 5 mmol/L (ref 3.5–5.1)
Sodium: 141 mmol/L (ref 135–145)

## 2020-05-13 LAB — GLUCOSE, CAPILLARY
Glucose-Capillary: 82 mg/dL (ref 70–99)
Glucose-Capillary: 83 mg/dL (ref 70–99)
Glucose-Capillary: 134 mg/dL — ABNORMAL HIGH (ref 70–99)
Glucose-Capillary: 139 mg/dL — ABNORMAL HIGH (ref 70–99)

## 2020-05-13 LAB — HEPATITIS B SURFACE ANTIBODY, QUANTITATIVE: Hep B S AB Quant (Post): 6.9 m[IU]/mL — ABNORMAL LOW (ref >9.9)

## 2020-05-13 LAB — CBC
HCT: 28.1 % — ABNORMAL LOW (ref 36.0–46.0)
Hemoglobin: 8.7 g/dL — ABNORMAL LOW (ref 12.0–15.0)
MCH: 30.6 pg (ref 26.0–34.0)
MCHC: 31 g/dL (ref 30.0–36.0)
MCV: 98.9 fL (ref 80.0–100.0)
Platelets: 203 10*3/uL (ref 150–400)
RBC: 2.84 MIL/uL — ABNORMAL LOW (ref 3.87–5.11)
RDW: 13.1 % (ref 11.5–15.5)
WBC: 5.6 10*3/uL (ref 4.0–10.5)
nRBC: 0 % (ref 0.0–0.2)

## 2020-05-13 LAB — MAGNESIUM: Magnesium: 1.9 mg/dL (ref 1.7–2.4)

## 2020-05-13 LAB — PARATHYROID HORMONE, INTACT (NO CA): PTH: 70 pg/mL — ABNORMAL HIGH (ref 15–65)

## 2020-05-13 MED ORDER — TOPIRAMATE 25 MG PO TABS
25.0000 mg | ORAL_TABLET | Freq: Two times a day (BID) | ORAL | Status: DC
  Administered 2020-05-13 – 2020-06-21 (×76): 25 mg via ORAL
  Filled 2020-05-13 (×84): qty 1

## 2020-05-13 MED ORDER — TRAMADOL HCL 50 MG PO TABS
50.0000 mg | ORAL_TABLET | Freq: Once | ORAL | Status: AC
  Administered 2020-05-13: 50 mg via ORAL
  Filled 2020-05-13: qty 1

## 2020-05-13 MED ORDER — SUMATRIPTAN SUCCINATE 50 MG PO TABS
25.0000 mg | ORAL_TABLET | Freq: Once | ORAL | Status: AC
  Administered 2020-05-13: 25 mg via ORAL
  Filled 2020-05-13: qty 1

## 2020-05-13 MED ORDER — LOSARTAN POTASSIUM 50 MG PO TABS
50.0000 mg | ORAL_TABLET | Freq: Every day | ORAL | Status: DC
  Administered 2020-05-13 – 2020-05-22 (×10): 50 mg via ORAL
  Filled 2020-05-13 (×11): qty 1

## 2020-05-13 NOTE — Progress Notes (Signed)
PROGRESS NOTE    Katie [name]  XVQ:[id number] DOB: February 06, 1956 DOA: 04/30/2020 PCP: Patient, [name]   Brief Narrative: Katie [name] is a 64 y.o. female with a history of diabetes mellitus type 2, stage IV CKD, heart failure, hypertension. Patient presented secondary to dyspnea with evidence of volume overload and pleural effusion.   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 Cardiomegaly on chest x-ray. Started on Lasix IV on admission but has now progressed to requiring hemodialysis.  Pleural effusion In setting of fluid overload with new diagnosis of grade 2 diastolic heart failure. No previous history of pleural effusion. Diagnostic right thoracentesis performed on 12/20. No evidence of malignancy on cytology  Diabetes mellitus, type 2 Hemoglobin A1C of 4.7%. No need for pharmacologic treatment. Diet control.  CKD stage V Unknown baseline. Patient is unsure of her primary nephrologist in [place]. Creatinine of 2.97 on admission. Per patient, she was planned to start hemodialysis in addition to consideration for renal transplant. Nephrology consulted on 12/20. Worsening renal function in setting of attempted Lasix diuresis for heart failure. Nephrology now started hemodialysis. -Nephrology recommendations: HD started 09/32  Acute diastolic heart failure New diagnosis. Grade 2 dysfunction. Normal EF. Associated RV systolic dysfunction with elevated PA pressure seen on Transthoracic Echocardiogram although RHC on 12/28 significant for mildly  elevated right heart/pulmonary artery pressures. Weight down 1.6 kg from admission; no weight available today. Net IO Since Admission: -9,485 mL [05/13/20 1322] -Cardiology Recommendations: Lasix 40 mg IV -Daily weights/strict in and out  Moderate pericardial effusion No clinical evidence of tamponade. No recent infections. In setting of CKD V with elevated BUN. No chest pain. -Cardiology recommendation: outpatient Transthoracic Echocardiogram for surveillance  Mild/moderate aortic stenosis Unsure if this is symptomatic. Patient does have dyspnea with exertion but this may be multifactorial. -Cardiology recommendations: outpatient Transthoracic Echocardiogram for surveillance  Primary hypertension Patient is on at least hydrochlorothiazide as an outpatient but is unsure of her regimen. Currently uncontrolled. -Amlodipine 10 mg daily and Coreg 12.5 mg BID  Anemia Possible related to underlying kidney disease. Evidence of macrocytosis on CBC. TIBC low but otherwise normal iron panel. Vitamin B12 and folate normal.  Hyperkalemia In setting of CKD. Lokelma 10 g given with improvement.  Pressure injury Mid coccyx, POA  History of tobacco use Patient has quit for at least 10 years. Previously, 20 pack year history.  Headache Normal chronic headache. Patient states she takes Imitrex as needed. Resolved.  Obesity Body mass index is 32.06 kg/m.   DVT prophylaxis: Lovenox Code Status:   Code Status: Full Code Family Communication: None at bedside Disposition Plan: Discharge SNF likely in 3+ days pending diuresis.   Consultants:   Nephrology  Cardiology  Vascular surgery  Procedures:   TRANSTHORACIC ECHOCARDIOGRAM (05/01/2020) IMPRESSIONS    1. Left ventricular ejection fraction, by estimation, is 55 to 60%. The  left ventricle has normal function. The left ventricle has no regional  wall motion abnormalities. There is severe concentric left ventricular  hypertrophy.  Left ventricular diastolic  parameters are consistent with Grade II diastolic dysfunction  (pseudonormalization).  2. Right ventricular systolic function is moderately reduced. The right  ventricular size is normal. Mildly increased right ventricular wall  thickness. There is moderately elevated pulmonary artery systolic  pressure.  3. Left atrial size was mildly dilated.  4. Right atrial size was mildly dilated.  5. Moderate pericardial effusion. The pericardial effusion is  circumferential.  6. The mitral valve is abnormal. Mild to moderate mitral valve  regurgitation.  7. The tricuspid valve is abnormal. Tricuspid valve regurgitation is mild  to moderate.  8. The aortic valve is abnormal. Aortic valve regurgitation is mild to  moderate. Mild to moderate aortic valve stenosis.  RIGHT HEART CATHETERIZATION (05/10/2020) Conclusions: 1. Mildly elevated left heart, right heart, and pulmonary artery pressures. 2. Normal to supranormal Fick cardiac output/index.  Recommendations: 1. Continue gentle diuresis. 2. Consider further workup of potential causes of high-output heart failure.   Antimicrobials:  None    Subjective: No issues  Objective: Vitals:   05/13/20 1215 05/13/20 1230 05/13/20 1245 05/13/20 1300  BP: (!) 142/75 (!) 152/75 (!) 154/72 (!) 155/77  Pulse: 66 72 68 70  Resp: 16 17 14    Temp:      TempSrc:      SpO2:      Weight:      Height:        Intake/Output Summary (Last 24 hours) at 05/13/2020 1322 Last data filed at 05/13/2020 1018 Gross per 24 hour  Intake 720 ml  Output 600 ml  Net 120 ml   Filed Weights   05/11/20 1300 05/11/20 2000 05/12/20 0459  Weight: 90 kg 90.8 kg 90.1 kg    Examination:  General exam: Appears calm and comfortable Respiratory system: Clear to auscultation. Respiratory effort normal. Cardiovascular system: S1 & S2 heard, RRR. 2/6 systolic murmur Gastrointestinal system: Abdomen is nondistended, soft and  nontender. No organomegaly or masses felt. Normal bowel sounds heard. Central nervous system: Alert and oriented. No focal neurological deficits. Musculoskeletal: BLE 2+ pitting edema. No calf tenderness Skin: No cyanosis. No rashes Psychiatry: Judgement and insight appear normal. Mood & affect appropriate.   Data Reviewed: I have personally reviewed following labs and imaging studies  CBC Lab Results  Component Value Date   WBC 5.6 05/13/2020   RBC 2.84 (L) 05/13/2020   HGB 8.7 (L) 05/13/2020   HCT 28.1 (L) 05/13/2020   MCV 98.9 05/13/2020   MCH 30.6 05/13/2020   PLT 203 05/13/2020   MCHC 31.0 05/13/2020   RDW 13.1 99/37/1696     Last metabolic panel Lab Results  Component Value Date   NA 141 05/13/2020   K 5.0 05/13/2020   CL 107 05/13/2020   CO2 28 05/13/2020   BUN 53 (H) 05/13/2020   CREATININE 2.41 (H) 05/13/2020   GLUCOSE 92 05/13/2020   GFRNONAA 22 (L) 05/13/2020   CALCIUM 8.1 (L) 05/13/2020   PHOS 4.6 05/13/2020   PROT 5.6 (L) 05/02/2020   ALBUMIN 2.4 (L) 05/13/2020   LABGLOB 1.9 (L) 05/04/2020   AGRATIO 1.1 05/04/2020   BILITOT 0.9 04/30/2020   ALKPHOS 68 04/30/2020   AST 20 04/30/2020   ALT 26 04/30/2020   ANIONGAP 6 05/13/2020    CBG (last 3)  Recent Labs    05/12/20 1708 05/12/20 2027 05/13/20 0750  GLUCAP 110* 134* 82     GFR: Estimated Creatinine Clearance: 27.4 mL/min (A) (by C-G formula based on SCr of 2.41 mg/dL (H)).  Coagulation Profile:  No results for input(s): INR, PROTIME in the last 168 hours.  No results found for this or any previous visit (from the past 240 hour(s)).      Radiology Studies: PERIPHERAL VASCULAR CATHETERIZATION  Result Date: 05/11/2020 See op note       Scheduled Meds: . amLODipine  10 mg Oral Daily  . carvedilol  12.5 mg Oral BID WC  . Chlorhexidine Gluconate Cloth  6 each Topical Q0600  . feeding supplement (NEPRO CARB STEADY)  237 mL Oral BID BM  . furosemide  40 mg Intravenous Q12H  .  heparin  5,000 Units Subcutaneous Q8H  . insulin aspart  0-9 Units Subcutaneous TID WC  . multivitamin  1 tablet Oral QHS  . polyethylene glycol  17 g Oral Daily  . senna-docusate  1 tablet Oral BID  . sodium chloride flush  10 mL Intravenous Q12H  . sodium chloride flush  3 mL Intravenous Q12H  . SUMAtriptan  25 mg Oral Once  . topiramate  25 mg Oral BID  . traMADol  50 mg Oral Once   Continuous Infusions: . sodium chloride       LOS: 12 days     Cordelia Poche, MD [facility] 05/13/2020, 1:22 PM  If 7PM-7AM, please contact night-coverage [email or web address]

## 2020-05-13 NOTE — Progress Notes (Signed)
[place], [name] 05/13/20  Subjective:   Hospital day # 12 Patient seen and evaluated at bedside. Due for third dialysis treatment today.  Renal: 12/30 0701 - 12/31 0700 In: 960 [P.O.:960] Out: 1000 [Urine:1000] Lab Results  Component Value Date   CREATININE 2.41 (H) 05/13/2020   CREATININE 2.94 (H) 05/12/2020   CREATININE 3.44 (H) 05/11/2020     Objective:  Vital signs in last 24 hours:  Temp:  [98.3 F (36.8 C)-99.5 F (37.5 C)] 99 F (37.2 C) (12/31 1423) Pulse Rate:  [66-80] 75 (12/31 1423) Resp:  [12-20] 16 (12/31 1423) BP: (132-171)/(59-77) 171/72 (12/31 1423) SpO2:  [90 %-100 %] 97 % (12/31 1423)  Weight change:  Filed Weights   05/11/20 1300 05/11/20 2000 05/12/20 0459  Weight: 90 kg 90.8 kg 90.1 kg    Intake/Output:    Intake/Output Summary (Last 24 hours) at 05/13/2020 1517 Last data filed at 05/13/2020 1430 Gross per 24 hour  Intake 720 ml  Output 1550 ml  Net -830 ml     Physical Exam: General:  Chronically ill-appearing, laying in the bed  HEENT  anicteric, moist oral mucous membranes  Pulm/lungs  normal breathing effort  CVS/Heart  regular, no rub  Abdomen:   Soft, nontender  Extremities: + Dependent edema  Neurologic:  Alert, able to answer questions  Skin:  No acute rashes  Access: Right femoral temporary dialysis catheter.  Basic Metabolic Panel:  Recent Labs  Lab 05/09/20 0503 05/10/20 0516 05/10/20 1224 05/11/20 0612 05/11/20 0949 05/12/20 0447 05/12/20 0946 05/13/20 0649  NA 140 141  --  140  --  141  --  141  K 4.9 5.5*  --  6.2* 5.6* 5.2*  --  5.0  CL 110 110  --  109  --  108  --  107  CO2 24 26  --  24  --  26  --  28  GLUCOSE 102* 86  --  89  --  97  --  92  BUN 94* 99*  --  96*  --  77*  --  53*  CREATININE 3.47* 3.69* 3.58* 3.44*  --  2.94*  --  2.41*  CALCIUM 7.9* 8.2*  --  8.3*  --  7.9*  --  8.1*  MG 1.9 2.0  --  2.1  --  1.8  --  1.9  PHOS 7.0* 7.1*  --  6.6*  --  5.3* 4.8*  4.6     CBC: Recent Labs  Lab 05/10/20 1224 05/13/20 0649  WBC 5.2 5.6  HGB 8.8* 8.7*  HCT 28.1* 28.1*  MCV 99.6 98.9  PLT 175 203      Lab Results  Component Value Date   HEPBSAG NON REACTIVE 05/11/2020      Microbiology:  No results found for this or any previous visit (from the past 240 hour(s)).  Coagulation Studies: No results for input(s): LABPROT, INR in the last 72 hours.  Urinalysis: No results for input(s): COLORURINE, LABSPEC, PHURINE, GLUCOSEU, HGBUR, BILIRUBINUR, KETONESUR, PROTEINUR, UROBILINOGEN, NITRITE, LEUKOCYTESUR in the last 72 hours.  Invalid input(s): APPERANCEUR    Imaging: No results found.   Medications:   . sodium chloride     . amLODipine  10 mg Oral Daily  . carvedilol  12.5 mg Oral BID WC  . Chlorhexidine Gluconate Cloth  6 each Topical Q0600  . feeding supplement (NEPRO CARB STEADY)  237 mL Oral BID BM  . furosemide  40 mg Intravenous  Q12H  . heparin  5,000 Units Subcutaneous Q8H  . insulin aspart  0-9 Units Subcutaneous TID WC  . multivitamin  1 tablet Oral QHS  . polyethylene glycol  17 g Oral Daily  . senna-docusate  1 tablet Oral BID  . sodium chloride flush  10 mL Intravenous Q12H  . sodium chloride flush  3 mL Intravenous Q12H  . topiramate  25 mg Oral BID   sodium chloride, acetaminophen **OR** acetaminophen, ALPRAZolam, bisacodyl, guaiFENesin-dextromethorphan, melatonin, ondansetron **OR** ondansetron (ZOFRAN) IV, oxyCODONE-acetaminophen, promethazine, sodium chloride flush  Assessment/ Plan:  64 y.o. female with heart failure with preserved EF, hypertension, CKD, stroke   admitted on 04/30/2020 for Chronic kidney disease (CKD), stage IV (severe) (HCC) [N18.4] Volume overload [E87.70] #AKI with chronic kidney disease stage IV/hyperkalemia Current creatinine of 3.5, GFR 14 Awaiting records from previous nephrology evaluation in [place].   Renal ultrasound shows echogenic kidneys Hemoglobin A1c 4.7% May 01, 2020 Underlying CKD likely secondary to hypertension and atherosclerosis Creatinine clearance came back low at 9. Creatinine 3.4 EGFR 14 with BUN of 96. -Patient due for third dialysis treatment today.  Ultrafiltration target 0.5 kg.  Next dialysis treatment will be scheduled for Monday.  #Proteinuria Urine protein to creatinine ratio 5.35 g May 03, 2020 Serological studies May 04, 2020: ANA negative, ANCA negative, kappa lambda ratio 1.6 We plan to add losartan 50 mg daily now.  #Severe hypertension, lower extremity edema 2D echo with LVEF 55 to 60%, moderately elevated pulmonary artery systolic pressures, moderate pericardial effusion, Mild to moderate aortic stenosiscurrently managed with torsemide as needed -Continue amlodipine and carvedilol but given the fact the blood pressure is high and the patient is now on hemodialysis we will go ahead and start losartan.   LOS: 12 Katie [name] 12/31/20213:17 PM  [place], [place]  Note: This note was prepared with Dragon dictation. Any transcription errors are unintentional

## 2020-05-13 NOTE — Progress Notes (Signed)
Progress Note  Patient Name: Katie [name] Date of Encounter: 05/13/2020  [facility] Cardiologist: new to [facility]-   Subjective   Reports having some shortness of breath overnight, was placed back on oxygen Scheduled for dialysis today Making urine on torsemide Reports having some right flank pain when she urinates  Inpatient Medications    Scheduled Meds: . amLODipine  10 mg Oral Daily  . carvedilol  12.5 mg Oral BID WC  . Chlorhexidine Gluconate Cloth  6 each Topical Q0600  . feeding supplement (NEPRO CARB STEADY)  237 mL Oral BID BM  . furosemide  40 mg Intravenous Q12H  . heparin  5,000 Units Subcutaneous Q8H  . insulin aspart  0-9 Units Subcutaneous TID WC  . multivitamin  1 tablet Oral QHS  . polyethylene glycol  17 g Oral Daily  . senna-docusate  1 tablet Oral BID  . sodium chloride flush  10 mL Intravenous Q12H  . sodium chloride flush  3 mL Intravenous Q12H  . SUMAtriptan  25 mg Oral Once  . topiramate  25 mg Oral BID   Continuous Infusions: . sodium chloride     PRN Meds: sodium chloride, acetaminophen **OR** acetaminophen, ALPRAZolam, bisacodyl, guaiFENesin-dextromethorphan, melatonin, ondansetron **OR** ondansetron (ZOFRAN) IV, oxyCODONE-acetaminophen, promethazine, sodium chloride flush   Vital Signs    Vitals:   05/12/20 1707 05/12/20 1937 05/13/20 0523 05/13/20 0747  BP: (!) 151/59 (!) 132/59 (!) 161/67 (!) 161/73  Pulse: 80 73 76 77  Resp: 18 20 20 17   Temp: 99.1 F (37.3 C) 98.4 F (36.9 C) 98.3 F (36.8 C) 98.8 F (37.1 C)  TempSrc: Oral Oral Oral Oral  SpO2: 91% 90% 90% 95%  Weight:      Height:        Intake/Output Summary (Last 24 hours) at 05/13/2020 1010 Last data filed at 05/12/2020 1820 Gross per 24 hour  Intake 480 ml  Output 600 ml  Net -120 ml   Last 3 Weights 05/12/2020 05/11/2020 05/11/2020  Weight (lbs) 198 lb 10.2 oz 200 lb 1.6 oz 198 lb 5.8 oz  Weight (kg) 90.1 kg 90.765 kg 89.976 kg      Telemetry    Normal  sinus rhythm- Personally Reviewed  ECG    - Personally Reviewed  Physical Exam   GEN: No acute distress.   Neck:  JVD 10+ Cardiac: RRR, no murmurs, rubs, or gallops.  Respiratory: Clear to auscultation bilaterally. GI: Soft, nontender, non-distended  MS: No edema; No deformity. Neuro:  Nonfocal  Psych: Normal affect   Labs    High Sensitivity Troponin:   Recent Labs  Lab 04/30/20 1601 04/30/20 2114  TROPONINIHS 24* 21*      Chemistry Recent Labs  Lab 05/11/20 0612 05/11/20 0949 05/12/20 0447 05/13/20 0649  NA 140  --  141 141  K 6.2* 5.6* 5.2* 5.0  CL 109  --  108 107  CO2 24  --  26 28  GLUCOSE 89  --  97 92  BUN 96*  --  77* 53*  CREATININE 3.44*  --  2.94* 2.41*  CALCIUM 8.3*  --  7.9* 8.1*  ALBUMIN 2.2*  --  2.2* 2.4*  GFRNONAA 14*  --  17* 22*  ANIONGAP 7  --  7 6     Hematology Recent Labs  Lab 05/10/20 1224 05/13/20 0649  WBC 5.2 5.6  RBC 2.82* 2.84*  HGB 8.8* 8.7*  HCT 28.1* 28.1*  MCV 99.6 98.9  MCH 31.2 30.6  MCHC  31.3 31.0  RDW 13.3 13.1  PLT 175 203    BNPNo results for input(s): BNP, PROBNP in the last 168 hours.   DDimer No results for input(s): DDIMER in the last 168 hours.   Radiology    PERIPHERAL VASCULAR CATHETERIZATION  Result Date: 05/11/2020 See op note   Cardiac Studies   Echocardiogram May 01, 2020 1. Left ventricular ejection fraction, by estimation, is 55 to 60%. The  left ventricle has normal function. The left ventricle has no regional  wall motion abnormalities. There is severe concentric left ventricular  hypertrophy. Left ventricular diastolic  parameters are consistent with Grade II diastolic dysfunction  (pseudonormalization).  2. Right ventricular systolic function is moderately reduced. The right  ventricular size is normal. Mildly increased right ventricular wall  thickness. There is moderately elevated pulmonary artery systolic  pressure.  3. Left atrial size was mildly dilated.  4.  Right atrial size was mildly dilated.  5. Moderate pericardial effusion. The pericardial effusion is  circumferential.  6. The mitral valve is abnormal. Mild to moderate mitral valve  regurgitation.  7. The tricuspid valve is abnormal. Tricuspid valve regurgitation is mild  to moderate.  8. The aortic valve is abnormal. Aortic valve regurgitation is mild to  moderate. Mild to moderate aortic valve stenosis.   Patient Profile     64 y.o. female with history of HFpEF, CKD stage IV, and HTN who presented with increasing shortness of breath and edema who we are seeing for acute on chronic HFpEF and pulmonary hypertension.  Assessment & Plan    Acute on chronic diastolic CHF  Scheduled to receive hemodialysis today   IV Lasix twice daily , making good urine Improvement in renal function Shortness of breath last night, back on oxygen Plan is to continue dialysis, diuresis for symptom relief Note indicating close to -10 L   Acute on chronic renal failure stage IV   slowly improving  Temporary dialysis catheter placed December 29  Has received two rounds of dialysis, 500 cc removed each round Making good urine   Hyperkalemia  Improving with dialysis, 5.0   Essential hypertension   on amlodipine, carvedilol, hydralazine, will continue to monitor for now  She reports long history of orthostasis Before adding additional medications, would recommend checking orthostatics In the past she has been on midodrine, reports 40 point drop in pressure with standing   Total encounter time more than 25 minutes  Greater than 50% was spent in counseling and coordination of care with the patient    For questions or updates, please contact [place] Please consult [email or web address] for contact info under        Signed, Ida Rogue, MD  05/13/2020, 10:10 AM

## 2020-05-13 NOTE — Progress Notes (Signed)
PT Cancellation Note  Patient Details Name: Katie [name] MRN: [id number] DOB: November 02, 1955   Cancelled Treatment:     PT attempt. PT hold. Pt just returned from dialysis  and is awaiting lunch. Currently using tem fem access for dialysis and per PT protocols unsafely to get OOB. Pt states may have permanent access placement on Monday. She was very pleasant and conversational. Will return at later time date when pt feels more up for participating in PT.     Willette Pa 05/13/2020, 2:52 PM

## 2020-05-13 NOTE — Plan of Care (Signed)
  Problem: Education: Goal: Ability to demonstrate management of disease process will improve Outcome: Progressing Goal: Ability to verbalize understanding of medication therapies will improve Outcome: Progressing   Problem: Activity: Goal: Capacity to carry out activities will improve Outcome: Progressing   

## 2020-05-14 LAB — GLUCOSE, CAPILLARY
Glucose-Capillary: 74 mg/dL (ref 70–99)
Glucose-Capillary: 131 mg/dL — ABNORMAL HIGH (ref 70–99)
Glucose-Capillary: 147 mg/dL — ABNORMAL HIGH (ref 70–99)
Glucose-Capillary: 261 mg/dL — ABNORMAL HIGH (ref 70–99)

## 2020-05-14 LAB — MAGNESIUM: Magnesium: 1.8 mg/dL (ref 1.7–2.4)

## 2020-05-14 LAB — RENAL FUNCTION PANEL
Albumin: 2.2 g/dL — ABNORMAL LOW (ref 3.5–5.0)
Anion gap: 7 (ref 5–15)
BUN: 33 mg/dL — ABNORMAL HIGH (ref 8–23)
CO2: 29 mmol/L (ref 22–32)
Calcium: 8 mg/dL — ABNORMAL LOW (ref 8.9–10.3)
Chloride: 102 mmol/L (ref 98–111)
Creatinine, Ser: 1.82 mg/dL — ABNORMAL HIGH (ref 0.44–1.00)
GFR, Estimated: 31 mL/min — ABNORMAL LOW (ref >60)
Glucose, Bld: 84 mg/dL (ref 70–99)
Phosphorus: 3.7 mg/dL (ref 2.5–4.6)
Potassium: 4.2 mmol/L (ref 3.5–5.1)
Sodium: 138 mmol/L (ref 135–145)

## 2020-05-14 LAB — HEPATITIS B DNA, ULTRAQUANTITATIVE, PCR
HBV DNA SERPL PCR-ACNC: NOT DETECTED IU/mL
HBV DNA SERPL PCR-LOG IU: UNDETERMINED log10 IU/mL

## 2020-05-14 MED ORDER — METOCLOPRAMIDE HCL 5 MG/ML IJ SOLN
10.0000 mg | Freq: Once | INTRAMUSCULAR | Status: AC
  Administered 2020-05-14: 10 mg via INTRAVENOUS
  Filled 2020-05-14: qty 2

## 2020-05-14 MED ORDER — DIPHENHYDRAMINE HCL 50 MG/ML IJ SOLN
25.0000 mg | Freq: Once | INTRAMUSCULAR | Status: AC
  Administered 2020-05-14: 25 mg via INTRAVENOUS
  Filled 2020-05-14: qty 1

## 2020-05-14 MED ORDER — DEXAMETHASONE SODIUM PHOSPHATE 10 MG/ML IJ SOLN
10.0000 mg | Freq: Once | INTRAMUSCULAR | Status: AC
  Administered 2020-05-14: 10 mg via INTRAVENOUS
  Filled 2020-05-14: qty 1

## 2020-05-14 NOTE — Progress Notes (Signed)
[place], [name] 05/14/20  Subjective:   Hospital day # 13 Patient has tolerated initiation of dialysis treatment well. She has undergone 3 dialysis treatments.  Renal: 12/31 0701 - 01/01 0700 In: 240 [P.O.:240] Out: 1500 [Urine:1000] Lab Results  Component Value Date   CREATININE 1.82 (H) 05/14/2020   CREATININE 2.41 (H) 05/13/2020   CREATININE 2.94 (H) 05/12/2020     Objective:  Vital signs in last 24 hours:  Temp:  [98 F (36.7 C)-99 F (37.2 C)] 98 F (36.7 C) (01/01 1228) Pulse Rate:  [67-75] 70 (01/01 1228) Resp:  [14-20] 20 (01/01 0346) BP: (135-171)/(60-78) 140/60 (01/01 1228) SpO2:  [95 %-98 %] 95 % (01/01 1228) Weight:  [86.3 kg] 86.3 kg (01/01 0346)  Weight change:  Filed Weights   05/11/20 2000 05/12/20 0459 05/14/20 0346  Weight: 90.8 kg 90.1 kg 86.3 kg    Intake/Output:    Intake/Output Summary (Last 24 hours) at 05/14/2020 1301 Last data filed at 05/14/2020 0940 Gross per 24 hour  Intake 480 ml  Output 1500 ml  Net -1020 ml     Physical Exam: General:  Chronically ill-appearing, laying in the bed  HEENT  anicteric, moist oral mucous membranes  Pulm/lungs  clear bilateral, normal breathing effort  CVS/Heart  regular, no rub  Abdomen:   Soft, nontender  Extremities: + Dependent edema  Neurologic:  Alert, able to answer questions  Skin:  No acute rashes  Access: Right femoral temporary dialysis catheter.  Basic Metabolic Panel:  Recent Labs  Lab 05/10/20 0516 05/10/20 1224 05/11/20 0612 05/11/20 0949 05/12/20 0447 05/12/20 0946 05/13/20 0649 05/14/20 0505  NA 141  --  140  --  141  --  141 138  K 5.5*  --  6.2* 5.6* 5.2*  --  5.0 4.2  CL 110  --  109  --  108  --  107 102  CO2 26  --  24  --  26  --  28 29  GLUCOSE 86  --  89  --  97  --  92 84  BUN 99*  --  96*  --  77*  --  53* 33*  CREATININE 3.69* 3.58* 3.44*  --  2.94*  --  2.41* 1.82*  CALCIUM 8.2*  --  8.3*  --  7.9*  --  8.1* 8.0*  MG 2.0   --  2.1  --  1.8  --  1.9 1.8  PHOS 7.1*  --  6.6*  --  5.3* 4.8* 4.6 3.7     CBC: Recent Labs  Lab 05/10/20 1224 05/13/20 0649  WBC 5.2 5.6  HGB 8.8* 8.7*  HCT 28.1* 28.1*  MCV 99.6 98.9  PLT 175 203      Lab Results  Component Value Date   HEPBSAG NON REACTIVE 05/11/2020      Microbiology:  No results found for this or any previous visit (from the past 240 hour(s)).  Coagulation Studies: No results for input(s): LABPROT, INR in the last 72 hours.  Urinalysis: No results for input(s): COLORURINE, LABSPEC, PHURINE, GLUCOSEU, HGBUR, BILIRUBINUR, KETONESUR, PROTEINUR, UROBILINOGEN, NITRITE, LEUKOCYTESUR in the last 72 hours.  Invalid input(s): APPERANCEUR    Imaging: No results found.   Medications:   . sodium chloride     . amLODipine  10 mg Oral Daily  . carvedilol  12.5 mg Oral BID WC  . Chlorhexidine Gluconate Cloth  6 each Topical Q0600  . feeding supplement (NEPRO CARB STEADY)  237 mL Oral BID BM  . furosemide  40 mg Intravenous Q12H  . heparin  5,000 Units Subcutaneous Q8H  . insulin aspart  0-9 Units Subcutaneous TID WC  . losartan  50 mg Oral Daily  . multivitamin  1 tablet Oral QHS  . polyethylene glycol  17 g Oral Daily  . senna-docusate  1 tablet Oral BID  . sodium chloride flush  10 mL Intravenous Q12H  . sodium chloride flush  3 mL Intravenous Q12H  . topiramate  25 mg Oral BID   sodium chloride, acetaminophen **OR** acetaminophen, ALPRAZolam, bisacodyl, guaiFENesin-dextromethorphan, melatonin, ondansetron **OR** ondansetron (ZOFRAN) IV, oxyCODONE-acetaminophen, promethazine, sodium chloride flush  Assessment/ Plan:  65 y.o. female with heart failure with preserved EF, hypertension, CKD, stroke   admitted on 04/30/2020 for Chronic kidney disease (CKD), stage IV (severe) (HCC) [N18.4] Volume overload [E87.70] #AKI with chronic kidney disease stage IV/hyperkalemia Current creatinine of 3.5, GFR 14 Awaiting records from previous  nephrology evaluation in [place].   Renal ultrasound shows echogenic kidneys Hemoglobin A1c 4.7% May 01, 2020 Underlying CKD likely secondary to hypertension and atherosclerosis Creatinine clearance came back low at 9. Creatinine 3.4 EGFR 14 with BUN of 96. -Patient underwent third dialysis treatment yesterday.  She will need PermCath on Monday.  #Proteinuria Urine protein to creatinine ratio 5.35 g May 03, 2020 Serological studies May 04, 2020: ANA negative, ANCA negative, kappa lambda ratio 1.6 Continue losartan 50 mg daily.  #Severe hypertension, lower extremity edema 2D echo with LVEF 55 to 60%, moderately elevated pulmonary artery systolic pressures, moderate pericardial effusion, Mild to moderate aortic stenosiscurrently managed with torsemide as needed -Blood pressure improved.  Continue amlodipine, carvedilol, losartan.   LOS: 13 Katie [name] 1/1/20221:01 PM  [place], [place]  Note: This note was prepared with Dragon dictation. Any transcription errors are unintentional

## 2020-05-14 NOTE — Progress Notes (Signed)
PROGRESS NOTE    Syrah Daughtrey  ZSW:[id number] DOB: 1956-02-12 DOA: 04/30/2020 PCP: Patient, [name]   Brief Narrative: Katie [name] is a 65 y.o. female with a history of diabetes mellitus type 2, stage IV CKD, heart failure, hypertension. Patient presented secondary to dyspnea with evidence of volume overload and pleural effusion.   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 Cardiomegaly on chest x-ray. Started on Lasix IV on admission but has now progressed to requiring hemodialysis.  Pleural effusion In setting of fluid overload with new diagnosis of grade 2 diastolic heart failure. No previous history of pleural effusion. Diagnostic right thoracentesis performed on 12/20. No evidence of malignancy on cytology  Diabetes mellitus, type 2 Hemoglobin A1C of 4.7%. No need for pharmacologic treatment. Diet control.  CKD stage V Unknown baseline. Patient is unsure of her primary nephrologist in [place]. Creatinine of 2.97 on admission. Per patient, she was planned to start hemodialysis in addition to consideration for renal transplant. Nephrology consulted on 12/20. Worsening renal function in setting of attempted Lasix diuresis for heart failure. Nephrology now started hemodialysis. -Nephrology recommendations: HD started 32/20  Acute diastolic heart failure New diagnosis. Grade 2 dysfunction. Normal EF. Associated RV systolic dysfunction with elevated PA pressure seen on Transthoracic Echocardiogram although RHC on 12/28 significant for mildly  elevated right heart/pulmonary artery pressures. Weight down 1.6 kg from admission; no weight available today. Net IO Since Admission: -10,985 mL [05/14/20 0955] -Cardiology Recommendations: Lasix 40 mg IV -Daily weights/strict in and out  Moderate pericardial effusion No clinical evidence of tamponade. No recent infections. In setting of CKD V with elevated BUN. No chest pain. -Cardiology recommendation: outpatient Transthoracic Echocardiogram for surveillance  Mild/moderate aortic stenosis Unsure if this is symptomatic. Patient does have dyspnea with exertion but this may be multifactorial. -Cardiology recommendations: outpatient Transthoracic Echocardiogram for surveillance  Primary hypertension Patient is on at least hydrochlorothiazide as an outpatient but is unsure of her regimen. Currently uncontrolled. -Amlodipine 10 mg daily and Coreg 12.5 mg BID  Anemia Possible related to underlying kidney disease. Evidence of macrocytosis on CBC. TIBC low but otherwise normal iron panel. Vitamin B12 and folate normal.  Hyperkalemia In setting of CKD. Lokelma 10 g given with improvement.  Pressure injury Mid coccyx, POA  History of tobacco use Patient has quit for at least 10 years. Previously, 20 pack year history.  Headache Normal chronic headache. Patient states she takes Imitrex as needed. Recurrent. -Dose of decadron/benadryl/reglan IV x1; cannot use Toradol secondary to kidney disease  Obesity Body mass index is 30.7 kg/m.   DVT prophylaxis: Lovenox Code Status:   Code Status: Full Code Family Communication: None at bedside Disposition Plan: Discharge SNF likely in 2 days pending outpatient HD placement and bed availability for SNF.   Consultants:   Nephrology  Cardiology  Vascular surgery  Procedures:   TRANSTHORACIC ECHOCARDIOGRAM (05/01/2020) IMPRESSIONS    1. Left ventricular ejection fraction, by estimation, is 55 to 60%. The  left ventricle has normal  function. The left ventricle  has no regional  wall motion abnormalities. There is severe concentric left ventricular  hypertrophy. Left ventricular diastolic  parameters are consistent with Grade II diastolic dysfunction  (pseudonormalization).  2. Right ventricular systolic function is moderately reduced. The right  ventricular size is normal. Mildly increased right ventricular wall  thickness. There is moderately elevated pulmonary artery systolic  pressure.  3. Left atrial size was mildly dilated.  4. Right atrial size was mildly dilated.  5. Moderate pericardial effusion. The pericardial effusion is  circumferential.  6. The mitral valve is abnormal. Mild to moderate mitral valve  regurgitation.  7. The tricuspid valve is abnormal. Tricuspid valve regurgitation is mild  to moderate.  8. The aortic valve is abnormal. Aortic valve regurgitation is mild to  moderate. Mild to moderate aortic valve stenosis.  RIGHT HEART CATHETERIZATION (05/10/2020) Conclusions: 1. Mildly elevated left heart, right heart, and pulmonary artery pressures. 2. Normal to supranormal Fick cardiac output/index.  Recommendations: 1. Continue gentle diuresis. 2. Consider further workup of potential causes of high-output heart failure.   Antimicrobials:  None    Subjective: Headache today.  Objective: Vitals:   05/13/20 1553 05/13/20 1936 05/14/20 0346 05/14/20 0735  BP: 136/73 135/66 (!) 161/72 (!) 171/78  Pulse: 73 69 67 72  Resp: 17 19 20    Temp: 98.6 F (37 C) 98.7 F (37.1 C) 98.5 F (36.9 C) 98.2 F (36.8 C)  TempSrc: Oral Oral Oral Oral  SpO2: 97% 97% 98% 97%  Weight:   86.3 kg   Height:        Intake/Output Summary (Last 24 hours) at 05/14/2020 0955 Last data filed at 05/14/2020 0351 Gross per 24 hour  Intake 240 ml  Output 1500 ml  Net -1260 ml   Filed Weights   05/11/20 2000 05/12/20 0459 05/14/20 0346  Weight: 90.8 kg 90.1 kg 86.3 kg    Examination:  General  exam: Appears calm and comfortable Respiratory system: Clear to auscultation. Respiratory effort normal. Cardiovascular system: S1 & S2 heard, RRR. No murmurs, rubs, gallops or clicks. Gastrointestinal system: Abdomen is nondistended, soft and nontender. No organomegaly or masses felt. Normal bowel sounds heard. Central nervous system: Alert and oriented. No focal neurological deficits. Musculoskeletal: BLE pitting edema. No calf tenderness Skin: No cyanosis. No rashes Psychiatry: Judgement and insight appear normal. Mood & affect appropriate.   Data Reviewed: I have personally reviewed following labs and imaging studies  CBC Lab Results  Component Value Date   WBC 5.6 05/13/2020   RBC 2.84 (L) 05/13/2020   HGB 8.7 (L) 05/13/2020   HCT 28.1 (L) 05/13/2020   MCV 98.9 05/13/2020   MCH 30.6 05/13/2020   PLT 203 05/13/2020   MCHC 31.0 05/13/2020   RDW 13.1 55/73/2202     Last metabolic panel Lab Results  Component Value Date   NA 138 05/14/2020   K 4.2 05/14/2020   CL 102 05/14/2020   CO2 29 05/14/2020   BUN 33 (H) 05/14/2020   CREATININE 1.82 (H) 05/14/2020   GLUCOSE 84 05/14/2020   GFRNONAA 31 (L) 05/14/2020   CALCIUM 8.0 (L) 05/14/2020   PHOS 3.7 05/14/2020   PROT 5.6 (L) 05/02/2020   ALBUMIN 2.2 (L) 05/14/2020   LABGLOB 1.9 (L) 05/04/2020   AGRATIO 1.1 05/04/2020   BILITOT 0.9 04/30/2020   ALKPHOS 68 04/30/2020   AST 20 04/30/2020   ALT 26 04/30/2020   ANIONGAP 7 05/14/2020    CBG (last 3)  Recent Labs    05/13/20 1634 05/13/20  2129 05/14/20 0730  GLUCAP 134* 139* 74     GFR: Estimated Creatinine Clearance: 35.5 mL/min (A) (by C-G formula based on SCr of 1.82 mg/dL (H)).  Coagulation Profile: No results for input(s): INR, PROTIME in the last 168 hours.  No results found for this or any previous visit (from the past 240 hour(s)).      Radiology Studies: No results found.      Scheduled Meds: . amLODipine  10 mg Oral Daily  . carvedilol   12.5 mg Oral BID WC  . Chlorhexidine Gluconate Cloth  6 each Topical Q0600  . dexamethasone (DECADRON) injection  10 mg Intravenous Once  . diphenhydrAMINE  25 mg Intravenous Once  . feeding supplement (NEPRO CARB STEADY)  237 mL Oral BID BM  . furosemide  40 mg Intravenous Q12H  . heparin  5,000 Units Subcutaneous Q8H  . insulin aspart  0-9 Units Subcutaneous TID WC  . losartan  50 mg Oral Daily  . metoCLOPramide (REGLAN) injection  10 mg Intravenous Once  . multivitamin  1 tablet Oral QHS  . polyethylene glycol  17 g Oral Daily  . senna-docusate  1 tablet Oral BID  . sodium chloride flush  10 mL Intravenous Q12H  . sodium chloride flush  3 mL Intravenous Q12H  . topiramate  25 mg Oral BID   Continuous Infusions: . sodium chloride       LOS: 13 days     Cordelia Poche, MD [facility] 05/14/2020, 9:55 AM  If 7PM-7AM, please contact night-coverage [email or web address]

## 2020-05-14 NOTE — Progress Notes (Signed)
Progress Note  Patient Name: Katie [name] Date of Encounter: 05/14/2020  [facility] Cardiologist: new to [facility]-   Subjective   Reports feeling somewhat better, less short of breath today Had dialysis yesterday, continues to receive IV Lasix, making good urine, over 10 L negative Still reports some mild abdominal distention   Inpatient Medications    Scheduled Meds: . amLODipine  10 mg Oral Daily  . carvedilol  12.5 mg Oral BID WC  . Chlorhexidine Gluconate Cloth  6 each Topical Q0600  . feeding supplement (NEPRO CARB STEADY)  237 mL Oral BID BM  . furosemide  40 mg Intravenous Q12H  . heparin  5,000 Units Subcutaneous Q8H  . insulin aspart  0-9 Units Subcutaneous TID WC  . losartan  50 mg Oral Daily  . multivitamin  1 tablet Oral QHS  . polyethylene glycol  17 g Oral Daily  . senna-docusate  1 tablet Oral BID  . sodium chloride flush  10 mL Intravenous Q12H  . sodium chloride flush  3 mL Intravenous Q12H  . topiramate  25 mg Oral BID   Continuous Infusions: . sodium chloride     PRN Meds: sodium chloride, acetaminophen **OR** acetaminophen, ALPRAZolam, bisacodyl, guaiFENesin-dextromethorphan, melatonin, ondansetron **OR** ondansetron (ZOFRAN) IV, oxyCODONE-acetaminophen, promethazine, sodium chloride flush   Vital Signs    Vitals:   05/13/20 1936 05/14/20 0346 05/14/20 0735 05/14/20 1228  BP: 135/66 (!) 161/72 (!) 171/78 140/60  Pulse: 69 67 72 70  Resp: 19 20    Temp: 98.7 F (37.1 C) 98.5 F (36.9 C) 98.2 F (36.8 C) 98 F (36.7 C)  TempSrc: Oral Oral Oral Oral  SpO2: 97% 98% 97% 95%  Weight:  86.3 kg    Height:        Intake/Output Summary (Last 24 hours) at 05/14/2020 1302 Last data filed at 05/14/2020 0940 Gross per 24 hour  Intake 480 ml  Output 1500 ml  Net -1020 ml   Last 3 Weights 05/14/2020 05/12/2020 05/11/2020  Weight (lbs) 190 lb 3.2 oz 198 lb 10.2 oz 200 lb 1.6 oz  Weight (kg) 86.274 kg 90.1 kg 90.765 kg      Telemetry    Normal sinus  rhythm- Personally Reviewed  ECG    - Personally Reviewed  Physical Exam   Constitutional:  oriented to person, place, and time. No distress.  HENT:  Head: Grossly normal Eyes:  no discharge. No scleral icterus.  Neck: No JVD, no carotid bruits  Cardiovascular: Regular rate and rhythm, no murmurs appreciated Pulmonary/Chest: Clear to auscultation bilaterally, no wheezes or rails Abdominal: Soft.  no distension.  no tenderness.  Musculoskeletal: Normal range of motion Neurological:  normal muscle tone. Coordination normal. No atrophy Skin: Skin warm and dry Psychiatric: normal affect, pleasant   Labs    High Sensitivity Troponin:   Recent Labs  Lab 04/30/20 1601 04/30/20 2114  TROPONINIHS 24* 21*      Chemistry Recent Labs  Lab 05/12/20 0447 05/13/20 0649 05/14/20 0505  NA 141 141 138  K 5.2* 5.0 4.2  CL 108 107 102  CO2 26 28 29   GLUCOSE 97 92 84  BUN 77* 53* 33*  CREATININE 2.94* 2.41* 1.82*  CALCIUM 7.9* 8.1* 8.0*  ALBUMIN 2.2* 2.4* 2.2*  GFRNONAA 17* 22* 31*  ANIONGAP 7 6 7      Hematology Recent Labs  Lab 05/10/20 1224 05/13/20 0649  WBC 5.2 5.6  RBC 2.82* 2.84*  HGB 8.8* 8.7*  HCT 28.1* 28.1*  MCV  99.6 98.9  MCH 31.2 30.6  MCHC 31.3 31.0  RDW 13.3 13.1  PLT 175 203    BNPNo results for input(s): BNP, PROBNP in the last 168 hours.   DDimer No results for input(s): DDIMER in the last 168 hours.   Radiology    No results found.  Cardiac Studies   Echocardiogram May 01, 2020 1. Left ventricular ejection fraction, by estimation, is 55 to 60%. The  left ventricle has normal function. The left ventricle has no regional  wall motion abnormalities. There is severe concentric left ventricular  hypertrophy. Left ventricular diastolic  parameters are consistent with Grade II diastolic dysfunction  (pseudonormalization).  2. Right ventricular systolic function is moderately reduced. The right  ventricular size is normal. Mildly  increased right ventricular wall  thickness. There is moderately elevated pulmonary artery systolic  pressure.  3. Left atrial size was mildly dilated.  4. Right atrial size was mildly dilated.  5. Moderate pericardial effusion. The pericardial effusion is  circumferential.  6. The mitral valve is abnormal. Mild to moderate mitral valve  regurgitation.  7. The tricuspid valve is abnormal. Tricuspid valve regurgitation is mild  to moderate.  8. The aortic valve is abnormal. Aortic valve regurgitation is mild to  moderate. Mild to moderate aortic valve stenosis.   Patient Profile     65 y.o. female with history of HFpEF, CKD stage IV, and HTN who presented with increasing shortness of breath and edema who we are seeing for acute on chronic HFpEF and pulmonary hypertension.  Assessment & Plan    Acute on chronic diastolic CHF  Completed 3 short runs hemodialysis, she reports On Monday Continues to receive IV Lasix twice daily,  -10 L Renal function improving -Reports having continued mild abdominal distention, would continue IV Lasix for now Ideally would like to relocate dialysis catheter as she remained supine in bed, unable to remove secondary to femoral access   Acute on chronic renal failure stage IV   slowly improving  Temporary dialysis catheter placed December 29  Received 3 rounds dialysis, improvement in numbers   Hyperkalemia  Improving with dialysis, 4.2   Essential hypertension   on amlodipine, carvedilol, hydralazine, will continue to monitor for now  She reports long history of orthostasis Blood pressure 749 systolic, will continue to monitor Unable to check orthostatics given femoral catheter   Total encounter time more than 25 minutes  Greater than 50% was spent in counseling and coordination of care with the patient    For questions or updates, please contact [place] Please consult [email or web address] for contact info under         Signed, Ida Rogue, MD  05/14/2020, 1:02 PM

## 2020-05-15 LAB — RENAL FUNCTION PANEL
Albumin: 2.2 g/dL — ABNORMAL LOW (ref 3.5–5.0)
Anion gap: 8 (ref 5–15)
BUN: 43 mg/dL — ABNORMAL HIGH (ref 8–23)
CO2: 28 mmol/L (ref 22–32)
Calcium: 8.3 mg/dL — ABNORMAL LOW (ref 8.9–10.3)
Chloride: 103 mmol/L (ref 98–111)
Creatinine, Ser: 2.35 mg/dL — ABNORMAL HIGH (ref 0.44–1.00)
GFR, Estimated: 23 mL/min — ABNORMAL LOW (ref >60)
Glucose, Bld: 162 mg/dL — ABNORMAL HIGH (ref 70–99)
Phosphorus: 4.2 mg/dL (ref 2.5–4.6)
Potassium: 4.3 mmol/L (ref 3.5–5.1)
Sodium: 139 mmol/L (ref 135–145)

## 2020-05-15 LAB — GLUCOSE, CAPILLARY
Glucose-Capillary: 150 mg/dL — ABNORMAL HIGH (ref 70–99)
Glucose-Capillary: 128 mg/dL — ABNORMAL HIGH (ref 70–99)
Glucose-Capillary: 146 mg/dL — ABNORMAL HIGH (ref 70–99)
Glucose-Capillary: 146 mg/dL — ABNORMAL HIGH (ref 70–99)

## 2020-05-15 LAB — MAGNESIUM: Magnesium: 1.9 mg/dL (ref 1.7–2.4)

## 2020-05-15 NOTE — Progress Notes (Signed)
PROGRESS NOTE    Katie [name]  CNO:[id number] DOB: [birth year] DOA: 04/30/2020 PCP: Patient, [name]   Brief Narrative: Katie [name] is a 65 y.o. female with a history of diabetes mellitus type 2, stage IV CKD, heart failure, hypertension. Patient presented secondary to dyspnea with evidence of volume overload and pleural effusion.   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 Cardiomegaly on chest x-ray.  Started on Lasix IV on admission but has now progressed to requiring hemodialysis. Plan: Nephrology following for hemodialysis Currently through a groin line.  Anticipate need for Vas-Cath placement prior to discharge planning  Pleural effusion In setting of fluid overload with new diagnosis of grade 2 diastolic heart failure.  No previous history of pleural effusion.  Diagnostic right thoracentesis performed on 12/20.  No evidence of malignancy on cytology  Diabetes mellitus, type 2 Hemoglobin A1C of 4.7%.  No need for pharmacologic treatment. Diet control.  CKD stage V Unknown baseline.  Patient is unsure of her primary nephrologist in [place].  Creatinine of 2.97 on admission.  Per patient, she was planned to start hemodialysis in addition to consideration for renal transplant.  Nephrology consulted on 12/20.  Worsening renal function in setting of attempted Lasix diuresis for heart failure. Nephrology now started hemodialysis. Plan: Nephrology following for inpatient HD  Acute diastolic heart failure New diagnosis. Grade 2  dysfunction. Normal EF.  Associated RV systolic dysfunction with elevated PA pressure seen on TTE although RHC on 12/28 significant for mildly elevated right heart/pulmonary artery pressures.  Net -10 L over admission Plan: Continue Lasix 40 mg IV twice daily Daily weights Strict I's and O's Replace electrolytes, K>4; Mg>2  Moderate pericardial effusion No clinical evidence of tamponade.  No recent infections.  In setting of CKD V with elevated BUN. No chest pain. Plan: -Cardiology recommendation: outpatient Transthoracic Echocardiogram for surveillance  Mild/moderate aortic stenosis Unsure if this is symptomatic.  Patient does have dyspnea with exertion but this may be multifactorial. -Cardiology recommendations: outpatient Transthoracic Echocardiogram for surveillance  Primary hypertension Patient is on at least hydrochlorothiazide as an outpatient but is unsure of her regimen. Currently uncontrolled. -Amlodipine 10 mg daily and Coreg 12.5 mg BID -Lasix as above  Anemia Possible related to underlying kidney disease.  Evidence of macrocytosis on CBC.  TIBC low but otherwise normal iron panel. Vitamin B12 and folate normal.  Hyperkalemia In setting of CKD. Lokelma 10 g given with improvement.  Pressure injury Mid coccyx, POA  History of tobacco use Patient has quit for at least 10 years. Previously, 20 pack year history.  Headache Normal chronic headache. Patient states she takes Imitrex as needed.   Obesity Body mass index is 30.86 kg/m.   DVT prophylaxis: Lovenox Code Status:   Code Status: Full Code Family Communication: None at bedside Disposition Plan: Discharge SNF likely in 2 days pending outpatient HD placement and bed availability for SNF.   Consultants:   Nephrology  Cardiology  Vascular surgery  Procedures:   TRANSTHORACIC ECHOCARDIOGRAM (05/01/2020) IMPRESSIONS  1. Left ventricular ejection fraction, by estimation, is 55 to 60%. The   left ventricle has normal function. The left ventricle has no regional  wall motion abnormalities. There is severe concentric left ventricular  hypertrophy. Left ventricular diastolic  parameters are consistent with Grade II diastolic dysfunction  (pseudonormalization).  2. Right ventricular systolic function is moderately reduced. The right  ventricular size is normal. Mildly increased right ventricular wall  thickness. There is moderately elevated pulmonary artery systolic  pressure.  3. Left atrial size was mildly dilated.  4. Right atrial size was mildly dilated.  5. Moderate pericardial effusion. The pericardial effusion is  circumferential.  6. The mitral valve is abnormal. Mild to moderate mitral valve  regurgitation.  7. The tricuspid valve is abnormal. Tricuspid valve regurgitation is mild  to moderate.  8. The aortic valve is abnormal. Aortic valve regurgitation is mild to  moderate. Mild to moderate aortic valve stenosis.  RIGHT HEART CATHETERIZATION (05/10/2020) Conclusions: 1. Mildly elevated left heart, right heart, and pulmonary artery pressures. 2. Normal to supranormal Fick cardiac output/index.  Recommendations: 1. Continue gentle diuresis. 2. Consider further workup of potential causes of high-output heart failure.   Antimicrobials:  None    Subjective: Patient seen and examined.  No pain complaints this morning.  Endorsing fatigue  Objective: Vitals:   05/15/20 0241 05/15/20 0433 05/15/20 0758 05/15/20 1147  BP: (!) 153/70 (!) 164/74 (!) 159/71 (!) 153/68  Pulse: 74 73 75 74  Resp:  18 19 19   Temp: 97.8 F (36.6 C) 97.8 F (36.6 C) 98.2 F (36.8 C) 98.1 F (36.7 C)  TempSrc: Oral Oral Oral Oral  SpO2: 98% 99% 99% 99%  Weight:  86.7 kg    Height:        Intake/Output Summary (Last 24 hours) at 05/15/2020 1320 Last data filed at 05/15/2020 1131 Gross per 24 hour  Intake 923 ml  Output 1100 ml  Net -177 ml   Filed Weights    05/12/20 0459 05/14/20 0346 05/15/20 0433  Weight: 90.1 kg 86.3 kg 86.7 kg    Examination:  General exam: Appears calm and comfortable Respiratory system: Clear to auscultation. Respiratory effort normal. Cardiovascular system: S1 & S2 heard, RRR. No murmurs, rubs, gallops or clicks. Gastrointestinal system: Abdomen is nondistended, soft and nontender. No organomegaly or masses felt. Normal bowel sounds heard. Central nervous system: Alert and oriented. No focal neurological deficits. Musculoskeletal: BLE pitting edema. No calf tenderness Skin: No cyanosis. No rashes Psychiatry: Judgement and insight appear normal. Mood & affect appropriate.   Data Reviewed: I have personally reviewed following labs and imaging studies  CBC Lab Results  Component Value Date   WBC 5.6 05/13/2020   RBC 2.84 (L) 05/13/2020   HGB 8.7 (L) 05/13/2020   HCT 28.1 (L) 05/13/2020   MCV 98.9 05/13/2020   MCH 30.6 05/13/2020   PLT 203 05/13/2020   MCHC 31.0 05/13/2020   RDW 13.1 71/10/2692     Last metabolic panel Lab Results  Component Value Date   NA 139 05/15/2020   K 4.3 05/15/2020   CL 103 05/15/2020   CO2 28 05/15/2020   BUN 43 (H) 05/15/2020   CREATININE 2.35 (H) 05/15/2020   GLUCOSE 162 (H) 05/15/2020   GFRNONAA 23 (L) 05/15/2020   CALCIUM 8.3 (L) 05/15/2020   PHOS 4.2 05/15/2020   PROT 5.6 (L) 05/02/2020   ALBUMIN 2.2 (L) 05/15/2020   LABGLOB 1.9 (L) 05/04/2020   AGRATIO 1.1 05/04/2020   BILITOT 0.9  04/30/2020   ALKPHOS 68 04/30/2020   AST 20 04/30/2020   ALT 26 04/30/2020   ANIONGAP 8 05/15/2020    CBG (last 3)  Recent Labs    05/14/20 2115 05/15/20 0759 05/15/20 1148  GLUCAP 261* 150* 128*     GFR: Estimated Creatinine Clearance: 27.5 mL/min (A) (by C-G formula based on SCr of 2.35 mg/dL (H)).  Coagulation Profile: No results for input(s): INR, PROTIME in the last 168 hours.  No results found for this or any previous visit (from the past 240 hour(s)).       Radiology Studies: No results found.      Scheduled Meds: . amLODipine  10 mg Oral Daily  . carvedilol  12.5 mg Oral BID WC  . Chlorhexidine Gluconate Cloth  6 each Topical Q0600  . feeding supplement (NEPRO CARB STEADY)  237 mL Oral BID BM  . furosemide  40 mg Intravenous Q12H  . heparin  5,000 Units Subcutaneous Q8H  . insulin aspart  0-9 Units Subcutaneous TID WC  . losartan  50 mg Oral Daily  . multivitamin  1 tablet Oral QHS  . polyethylene glycol  17 g Oral Daily  . senna-docusate  1 tablet Oral BID  . sodium chloride flush  10 mL Intravenous Q12H  . sodium chloride flush  3 mL Intravenous Q12H  . topiramate  25 mg Oral BID   Continuous Infusions: . sodium chloride       LOS: 14 days   Time spent: 25 minutes  Ralene Muskrat MD [facility] 05/15/2020, 1:20 PM  If 7PM-7AM, please contact night-coverage [email or web address]

## 2020-05-15 NOTE — Progress Notes (Signed)
Progress Note  Patient Name: Katie [name] Date of Encounter: 05/15/2020  [facility] Cardiologist: new to [facility]-   Subjective   Heavy cough today, productive, loose Eating well Unable to get out of bed secondary to right femoral dialysis catheter Note indicating plan for dialysis tomorrow On Lasix 40 IV twice daily, climbing BUN and creatinine  Inpatient Medications    Scheduled Meds: . amLODipine  10 mg Oral Daily  . carvedilol  12.5 mg Oral BID WC  . Chlorhexidine Gluconate Cloth  6 each Topical Q0600  . feeding supplement (NEPRO CARB STEADY)  237 mL Oral BID BM  . furosemide  40 mg Intravenous Q12H  . heparin  5,000 Units Subcutaneous Q8H  . insulin aspart  0-9 Units Subcutaneous TID WC  . losartan  50 mg Oral Daily  . multivitamin  1 tablet Oral QHS  . polyethylene glycol  17 g Oral Daily  . senna-docusate  1 tablet Oral BID  . sodium chloride flush  10 mL Intravenous Q12H  . sodium chloride flush  3 mL Intravenous Q12H  . topiramate  25 mg Oral BID   Continuous Infusions: . sodium chloride     PRN Meds: sodium chloride, acetaminophen **OR** acetaminophen, ALPRAZolam, bisacodyl, guaiFENesin-dextromethorphan, melatonin, ondansetron **OR** ondansetron (ZOFRAN) IV, oxyCODONE-acetaminophen, promethazine, sodium chloride flush   Vital Signs    Vitals:   05/15/20 0241 05/15/20 0433 05/15/20 0758 05/15/20 1147  BP: (!) 153/70 (!) 164/74 (!) 159/71 (!) 153/68  Pulse: 74 73 75 74  Resp:  18 19 19   Temp: 97.8 F (36.6 C) 97.8 F (36.6 C) 98.2 F (36.8 C) 98.1 F (36.7 C)  TempSrc: Oral Oral Oral Oral  SpO2: 98% 99% 99% 99%  Weight:  86.7 kg    Height:        Intake/Output Summary (Last 24 hours) at 05/15/2020 1434 Last data filed at 05/15/2020 1335 Gross per 24 hour  Intake 1163 ml  Output 1100 ml  Net 63 ml   Last 3 Weights 05/15/2020 05/14/2020 05/12/2020  Weight (lbs) 191 lb 3.2 oz 190 lb 3.2 oz 198 lb 10.2 oz  Weight (kg) 86.728 kg 86.274 kg 90.1 kg       Telemetry    Normal sinus rhythm- Personally Reviewed  ECG    - Personally Reviewed  Physical Exam   Constitutional:  oriented to person, place, and time. No distress.  HENT:  Head: Grossly normal Eyes:  no discharge. No scleral icterus.  Neck: No JVD, no carotid bruits  Cardiovascular: Regular rate and rhythm, 2/6 sem RSB,  Pulmonary/Chest: Coarse breath sounds Abdominal: Soft.  no distension.  no tenderness.  Musculoskeletal: Normal range of motion Neurological:  normal muscle tone. Coordination normal. No atrophy Skin: Skin warm and dry Psychiatric: normal affect, pleasant    Labs    High Sensitivity Troponin:   Recent Labs  Lab 04/30/20 1601 04/30/20 2114  TROPONINIHS 24* 21*      Chemistry Recent Labs  Lab 05/13/20 0649 05/14/20 0505 05/15/20 0546  NA 141 138 139  K 5.0 4.2 4.3  CL 107 102 103  CO2 28 29 28   GLUCOSE 92 84 162*  BUN 53* 33* 43*  CREATININE 2.41* 1.82* 2.35*  CALCIUM 8.1* 8.0* 8.3*  ALBUMIN 2.4* 2.2* 2.2*  GFRNONAA 22* 31* 23*  ANIONGAP 6 7 8      Hematology Recent Labs  Lab 05/10/20 1224 05/13/20 0649  WBC 5.2 5.6  RBC 2.82* 2.84*  HGB 8.8* 8.7*  HCT 28.1*  28.1*  MCV 99.6 98.9  MCH 31.2 30.6  MCHC 31.3 31.0  RDW 13.3 13.1  PLT 175 203    BNPNo results for input(s): BNP, PROBNP in the last 168 hours.   DDimer No results for input(s): DDIMER in the last 168 hours.   Radiology    No results found.  Cardiac Studies   Echocardiogram May 01, 2020 1. Left ventricular ejection fraction, by estimation, is 55 to 60%. The  left ventricle has normal function. The left ventricle has no regional  wall motion abnormalities. There is severe concentric left ventricular  hypertrophy. Left ventricular diastolic  parameters are consistent with Grade II diastolic dysfunction  (pseudonormalization).  2. Right ventricular systolic function is moderately reduced. The right  ventricular size is normal. Mildly increased  right ventricular wall  thickness. There is moderately elevated pulmonary artery systolic  pressure.  3. Left atrial size was mildly dilated.  4. Right atrial size was mildly dilated.  5. Moderate pericardial effusion. The pericardial effusion is  circumferential.  6. The mitral valve is abnormal. Mild to moderate mitral valve  regurgitation.  7. The tricuspid valve is abnormal. Tricuspid valve regurgitation is mild  to moderate.  8. The aortic valve is abnormal. Aortic valve regurgitation is mild to  moderate. Mild to moderate aortic valve stenosis.   Patient Profile     65 y.o. female with history of HFpEF, CKD stage IV, and HTN who presented with increasing shortness of breath and edema who we are seeing for acute on chronic HFpEF and pulmonary hypertension.  Assessment & Plan    Acute on chronic diastolic CHF  Completed 3 short runs hemodialysis last week, also making good urine on IV Lasix, -10 L -Slight worsening in renal dysfunction -At this point suspect most of her respiratory distress is secondary to pneumonia -Would consider placing on oral Lasix daily and discontinuing IV   Acute on chronic renal failure stage IV  Was improving, though climbed today, still on IV Lasix twice daily Temporary dialysis catheter placed December 29  right groin -Plan to change dialysis catheter location tomorrow with dialysis tomorrow -Would consider holding Lasix IV and changing to oral dosing Right heart catheterization numbers were not particularly elevated December 28   Essential hypertension   on amlodipine, carvedilol, hydralazine, will continue to monitor for now  She reports long history of orthostasis -After change of femoral dialysis catheter, will check orthostatics  Leg weakness She reports tremendous leg weakness even prior to recent mission, will need PT Currently lives with a friend  Long discussion with friend and patient concerning presentation, management of  diastolic CHF,  Total encounter time more than 35 minutes  Greater than 50% was spent in counseling and coordination of care with the patient    For questions or updates, please contact [facility] Please consult [email or web address] for contact info under        Signed, Ida Rogue, MD  05/15/2020, 2:34 PM

## 2020-05-15 NOTE — Progress Notes (Signed)
[place], [name] 05/15/20  Subjective:   Hospital day # 14 Patient resting comfortably in bed today. Due for another dialysis session tomorrow. Has undergone 3 dialysis sessions thus far.  Renal: 01/01 0701 - 01/02 0700 In: 1280 [P.O.:1280] Out: 700 [Urine:700] Lab Results  Component Value Date   CREATININE 2.35 (H) 05/15/2020   CREATININE 1.82 (H) 05/14/2020   CREATININE 2.41 (H) 05/13/2020     Objective:  Vital signs in last 24 hours:  Temp:  [97.8 F (36.6 C)-98.2 F (36.8 C)] 98.1 F (36.7 C) (01/02 1147) Pulse Rate:  [73-82] 74 (01/02 1147) Resp:  [13-19] 19 (01/02 1147) BP: (145-164)/(68-78) 153/68 (01/02 1147) SpO2:  [95 %-99 %] 99 % (01/02 1147) Weight:  [86.7 kg] 86.7 kg (01/02 0433)  Weight change: 0.454 kg Filed Weights   05/12/20 0459 05/14/20 0346 05/15/20 0433  Weight: 90.1 kg 86.3 kg 86.7 kg    Intake/Output:    Intake/Output Summary (Last 24 hours) at 05/15/2020 1431 Last data filed at 05/15/2020 1335 Gross per 24 hour  Intake 1163 ml  Output 1100 ml  Net 63 ml     Physical Exam: General:  Chronically ill-appearing, laying in the bed  HEENT  anicteric, moist oral mucous membranes  Pulm/lungs  clear bilateral, normal breathing effort  CVS/Heart  regular, no rub  Abdomen:   Soft, nontender  Extremities: + Dependent edema  Neurologic:  Alert, able to answer questions  Skin:  No acute rashes  Access: Right femoral temporary dialysis catheter.  Basic Metabolic Panel:  Recent Labs  Lab 05/11/20 0612 05/11/20 0949 05/12/20 0447 05/12/20 0946 05/13/20 0649 05/14/20 0505 05/15/20 0546  NA 140  --  141  --  141 138 139  K 6.2* 5.6* 5.2*  --  5.0 4.2 4.3  CL 109  --  108  --  107 102 103  CO2 24  --  26  --  $R'28 29 28  'cU$ GLUCOSE 89  --  97  --  92 84 162*  BUN 96*  --  77*  --  53* 33* 43*  CREATININE 3.44*  --  2.94*  --  2.41* 1.82* 2.35*  CALCIUM 8.3*  --  7.9*  --  8.1* 8.0* 8.3*  MG 2.1  --  1.8  --   1.9 1.8 1.9  PHOS 6.6*  --  5.3* 4.8* 4.6 3.7 4.2     CBC: Recent Labs  Lab 05/10/20 1224 05/13/20 0649  WBC 5.2 5.6  HGB 8.8* 8.7*  HCT 28.1* 28.1*  MCV 99.6 98.9  PLT 175 203      Lab Results  Component Value Date   HEPBSAG NON REACTIVE 05/11/2020      Microbiology:  No results found for this or any previous visit (from the past 240 hour(s)).  Coagulation Studies: No results for input(s): LABPROT, INR in the last 72 hours.  Urinalysis: No results for input(s): COLORURINE, LABSPEC, PHURINE, GLUCOSEU, HGBUR, BILIRUBINUR, KETONESUR, PROTEINUR, UROBILINOGEN, NITRITE, LEUKOCYTESUR in the last 72 hours.  Invalid input(s): APPERANCEUR    Imaging: No results found.   Medications:   . sodium chloride     . amLODipine  10 mg Oral Daily  . carvedilol  12.5 mg Oral BID WC  . Chlorhexidine Gluconate Cloth  6 each Topical Q0600  . feeding supplement (NEPRO CARB STEADY)  237 mL Oral BID BM  . furosemide  40 mg Intravenous Q12H  . heparin  5,000 Units Subcutaneous Q8H  . insulin  aspart  0-9 Units Subcutaneous TID WC  . losartan  50 mg Oral Daily  . multivitamin  1 tablet Oral QHS  . polyethylene glycol  17 g Oral Daily  . senna-docusate  1 tablet Oral BID  . sodium chloride flush  10 mL Intravenous Q12H  . sodium chloride flush  3 mL Intravenous Q12H  . topiramate  25 mg Oral BID   sodium chloride, acetaminophen **OR** acetaminophen, ALPRAZolam, bisacodyl, guaiFENesin-dextromethorphan, melatonin, ondansetron **OR** ondansetron (ZOFRAN) IV, oxyCODONE-acetaminophen, promethazine, sodium chloride flush  Assessment/ Plan:  65 y.o. female with heart failure with preserved EF, hypertension, CKD, stroke   admitted on 04/30/2020 for Chronic kidney disease (CKD), stage IV (severe) (HCC) [N18.4] Volume overload [E87.70] #AKI with chronic kidney disease stage IV/hyperkalemia Current creatinine of 3.5, GFR 14 Awaiting records from previous nephrology evaluation in [place].    Renal ultrasound shows echogenic kidneys Hemoglobin A1c 4.7% May 01, 2020 Underlying CKD likely secondary to hypertension and atherosclerosis Creatinine clearance came back low at 9. Creatinine 3.4 EGFR 14 with BUN of 96. -Patient has completed initiation of dialysis and has had 3 dialysis sessions.  We will plan for fourth dialysis session tomorrow.  In addition we plan for PermCath placement tomorrow.  Outpatient hemodialysis center placement still pending.  #Proteinuria Urine protein to creatinine ratio 5.35 g May 03, 2020 Serological studies May 04, 2020: ANA negative, ANCA negative, kappa lambda ratio 1.6 Continue losartan 50 mg daily.  #Severe hypertension, lower extremity edema 2D echo with LVEF 55 to 60%, moderately elevated pulmonary artery systolic pressures, moderate pericardial effusion, Mild to moderate aortic stenosiscurrently managed with torsemide as needed -Blood pressure improved.  Continue amlodipine, carvedilol, losartan.  #Anemia of chronic kidney disease. Consider starting the patient on Epogen tomorrow.  LOS: 14 Alizee Maple 1/2/20222:31 PM  [place], [place]  Note: This note was prepared with Dragon dictation. Any transcription errors are unintentional

## 2020-05-15 NOTE — TOC Progression Note (Signed)
Transition of Care [facility]) - Progression Note    Patient Details  Name: Katie [name] MRN: [id number] Date of Birth: 1955-09-19  Transition of Care [facility]) CM/SW Contact  Izola Price, RN Phone Number: 05/15/2020, 1:43 PM  Clinical Narrative:    05/15/20 Update: Per provider notes patient will need VasCath prior to discharge, currently continued diuresing for volume overload, new heart failure dx and continued medical work up. On Hemodialysis. TOC will continue to monitor. Simmie Davies RN CM     Expected Discharge Plan: Skilled Nursing Facility Barriers to Discharge: Continued Medical Work up,Homeless with medical needs  Expected Discharge Plan and Services Expected Discharge Plan: [place] In-house Referral: Clinical Social Work Discharge Planning Services: Homebound not met per provider Post Acute Care Choice: [place] Living arrangements for the past 2 months: No permanent address (Lived with Sister and family)                 DME Arranged: N/A DME Agency: NA       HH Arranged: NA HH Agency: NA         Social Determinants of Health (SDOH) Interventions    Readmission Risk Interventions No flowsheet data found.

## 2020-05-16 ENCOUNTER — Inpatient Hospital Stay
Admission: EM | Disposition: A | Discharge status: Home / Self Care | Payer: Self-pay | Source: Home / Self Care | Attending: Internal Medicine

## 2020-05-16 ENCOUNTER — Encounter: Payer: Self-pay | Admitting: Vascular Surgery

## 2020-05-16 DIAGNOSIS — N185 Chronic kidney disease, stage 5: Secondary | ICD-10-CM

## 2020-05-16 HISTORY — PX: DIALYSIS/PERMA CATHETER INSERTION: CATH118288

## 2020-05-16 LAB — MAGNESIUM: Magnesium: 1.8 mg/dL (ref 1.7–2.4)

## 2020-05-16 LAB — GLUCOSE, CAPILLARY
Glucose-Capillary: 81 mg/dL (ref 70–99)
Glucose-Capillary: 82 mg/dL (ref 70–99)
Glucose-Capillary: 100 mg/dL — ABNORMAL HIGH (ref 70–99)
Glucose-Capillary: 102 mg/dL — ABNORMAL HIGH (ref 70–99)
Glucose-Capillary: 113 mg/dL — ABNORMAL HIGH (ref 70–99)
Glucose-Capillary: 101 mg/dL — ABNORMAL HIGH (ref 70–99)

## 2020-05-16 LAB — PHOSPHORUS: Phosphorus: 4.8 mg/dL — ABNORMAL HIGH (ref 2.5–4.6)

## 2020-05-16 LAB — MRSA PCR SCREENING: MRSA by PCR: NEGATIVE

## 2020-05-16 LAB — RENAL FUNCTION PANEL
Albumin: 2 g/dL — ABNORMAL LOW (ref 3.5–5.0)
Anion gap: 6 (ref 5–15)
BUN: 49 mg/dL — ABNORMAL HIGH (ref 8–23)
CO2: 28 mmol/L (ref 22–32)
Calcium: 8 mg/dL — ABNORMAL LOW (ref 8.9–10.3)
Chloride: 105 mmol/L (ref 98–111)
Creatinine, Ser: 2.72 mg/dL — ABNORMAL HIGH (ref 0.44–1.00)
GFR, Estimated: 19 mL/min — ABNORMAL LOW (ref >60)
Glucose, Bld: 72 mg/dL (ref 70–99)
Phosphorus: 4.7 mg/dL — ABNORMAL HIGH (ref 2.5–4.6)
Potassium: 4.3 mmol/L (ref 3.5–5.1)
Sodium: 139 mmol/L (ref 135–145)

## 2020-05-16 SURGERY — DIALYSIS/PERMA CATHETER INSERTION
Anesthesia: Moderate Sedation

## 2020-05-16 MED ORDER — BENZONATATE 100 MG PO CAPS
100.0000 mg | ORAL_CAPSULE | Freq: Once | ORAL | Status: AC
  Administered 2020-05-16: 100 mg via ORAL
  Filled 2020-05-16: qty 1

## 2020-05-16 MED ORDER — BENZONATATE 100 MG PO CAPS
200.0000 mg | ORAL_CAPSULE | Freq: Three times a day (TID) | ORAL | Status: DC
  Administered 2020-05-16 – 2020-06-12 (×80): 200 mg via ORAL
  Filled 2020-05-16 (×79): qty 2

## 2020-05-16 MED ORDER — HYDROCOD POLST-CPM POLST ER 10-8 MG/5ML PO SUER
5.0000 mL | Freq: Two times a day (BID) | ORAL | Status: DC | PRN
Start: 2020-05-16 — End: 2020-06-21
  Administered 2020-05-16 – 2020-06-19 (×20): 5 mL via ORAL
  Filled 2020-05-16 (×20): qty 5

## 2020-05-16 MED ORDER — METHOCARBAMOL 500 MG PO TABS
500.0000 mg | ORAL_TABLET | Freq: Four times a day (QID) | ORAL | Status: DC | PRN
  Administered 2020-05-17: 500 mg via ORAL
  Filled 2020-05-16 (×3): qty 1

## 2020-05-16 MED ORDER — MIDAZOLAM HCL 2 MG/2ML IJ SOLN
INTRAMUSCULAR | Status: DC | PRN
  Administered 2020-05-16: 1 mg via INTRAVENOUS

## 2020-05-16 MED ORDER — MIDAZOLAM HCL 2 MG/2ML IJ SOLN
INTRAMUSCULAR | Status: AC
  Filled 2020-05-16: qty 2

## 2020-05-16 MED ORDER — FENTANYL CITRATE (PF) 100 MCG/2ML IJ SOLN
INTRAMUSCULAR | Status: AC
  Filled 2020-05-16: qty 2

## 2020-05-16 MED ORDER — AMLODIPINE BESYLATE 5 MG PO TABS
5.0000 mg | ORAL_TABLET | Freq: Every day | ORAL | Status: DC
Start: 2020-05-17 — End: 2020-05-21
  Administered 2020-05-17 – 2020-05-20 (×4): 5 mg via ORAL
  Filled 2020-05-16 (×4): qty 1

## 2020-05-16 MED ORDER — FENTANYL CITRATE (PF) 100 MCG/2ML IJ SOLN
INTRAMUSCULAR | Status: DC | PRN
Start: 2020-05-16 — End: 2020-05-16
  Administered 2020-05-16: 50 ug via INTRAVENOUS

## 2020-05-16 MED ORDER — SODIUM CHLORIDE 0.9 % IV SOLN: INTRAVENOUS | Status: DC

## 2020-05-16 MED ORDER — HYDROMORPHONE HCL 1 MG/ML IJ SOLN
0.5000 mg | INTRAMUSCULAR | Status: DC | PRN
  Administered 2020-05-16 – 2020-05-17 (×3): 0.5 mg via INTRAVENOUS
  Filled 2020-05-16 (×3): qty 1

## 2020-05-16 MED ORDER — LIDOCAINE 5 % EX PTCH
1.0000 | MEDICATED_PATCH | CUTANEOUS | Status: DC
  Administered 2020-05-16 – 2020-06-29 (×14): 1 via TRANSDERMAL
  Filled 2020-05-16 (×46): qty 1

## 2020-05-16 MED ORDER — MIDAZOLAM HCL 2 MG/2ML IJ SOLN
INTRAMUSCULAR | Status: AC
  Administered 2020-05-16: 2 mg
  Filled 2020-05-16: qty 2

## 2020-05-16 MED ORDER — CLINDAMYCIN PHOSPHATE 300 MG/50ML IV SOLN
INTRAVENOUS | Status: AC
  Administered 2020-05-16: 300 mg
  Filled 2020-05-16: qty 50

## 2020-05-16 MED ORDER — FENTANYL CITRATE (PF) 100 MCG/2ML IJ SOLN
INTRAMUSCULAR | Status: DC | PRN
  Administered 2020-05-16: 25 ug via INTRAVENOUS

## 2020-05-16 SURGICAL SUPPLY — 6 items
BIOPATCH RED 1 DISK 7.0 (GAUZE/BANDAGES/DRESSINGS) ×1 IMPLANT
CATH CANNON HEMO 15FR 19 (HEMODIALYSIS SUPPLIES) ×1 IMPLANT
DILATOR VESSEL 38 20CM 14FR (INTRODUCER) ×1 IMPLANT
PACK ANGIOGRAPHY (CUSTOM PROCEDURE TRAY) ×1 IMPLANT
STOPCOCK PLUG RED CAP (CAP) ×2 IMPLANT
TOWEL OR 17X26 4PK STRL BLUE (TOWEL DISPOSABLE) ×1 IMPLANT

## 2020-05-16 NOTE — Progress Notes (Signed)
   05/12/20 1200 05/12/20 1215 05/12/20 1230  Vitals  BP 133/64 133/64 136/71  MAP (mmHg) 84 84 88  Pulse Rate 68 69 69  ECG Heart Rate 69 70 70  Resp 11 10 11   During Hemodialysis Assessment  Blood Flow Rate (mL/min) 250 mL/min 250 mL/min 250 mL/min  Arterial Pressure (mmHg) -80 mmHg -80 mmHg -80 mmHg  Venous Pressure (mmHg) 70 mmHg 70 mmHg 70 mmHg  Transmembrane Pressure (mmHg) 60 mmHg 60 mmHg 60 mmHg  Ultrafiltration Rate (mL/min) 200 mL/min 200 mL/min 200 mL/min  Dialysate Flow Rate (mL/min) 500 ml/min 500 ml/min 500 ml/min  Conductivity: Machine  14 14 14   HD Safety Checks Performed Yes Yes Yes  Intra-Hemodialysis Comments Progressing as prescribed Progressing as prescribed Progressing as prescribed    05/12/20 1245  Vitals  BP 138/66  MAP (mmHg) 89  Pulse Rate 71  ECG Heart Rate 71  Resp 11  During Hemodialysis Assessment  Blood Flow Rate (mL/min) 100 mL/min  Arterial Pressure (mmHg) -40 mmHg  Venous Pressure (mmHg) 30 mmHg  Transmembrane Pressure (mmHg) 20 mmHg  Ultrafiltration Rate (mL/min) 70 mL/min  Dialysate Flow Rate (mL/min) 500 ml/min  Conductivity: Machine  14  HD Safety Checks Performed Yes  Intra-Hemodialysis Comments Tx completed

## 2020-05-16 NOTE — Progress Notes (Signed)
Mobility Specialist - Progress Note   05/16/20 1500  Mobility  Activity Off unit  Mobility performed by Mobility specialist    2nd mobility attempt this date (cath placement). Per chart review, pt currently off unit for HD treatment. Will attempt session at another date/time as time permits.    Kathee Delton Mobility Specialist 05/16/20, 3:44 PM

## 2020-05-16 NOTE — Progress Notes (Signed)
PROGRESS NOTE    Katie [name]  BVQ:[id number] DOB: April 11, 1956 DOA: 04/30/2020 PCP: Patient, [name]   Brief Narrative: Katie [name] is a 65 y.o. female with a history of diabetes mellitus type 2, stage IV CKD, heart failure, hypertension. Patient presented secondary to dyspnea with evidence of volume overload and pleural effusion.  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 Cardiomegaly on chest x-ray.  Started on Lasix IV on admission but has now progressed to requiring hemodialysis. CKD likely progressed to ESRD Plan: Nephrology following for hemodialysis PermCath placed in right chest Plan for hemodialysis today  Pleural effusion In setting of fluid overload with new diagnosis of grade 2 diastolic heart failure.  No previous history of pleural effusion.  Diagnostic right thoracentesis performed on 12/20.  No evidence of malignancy on cytology  Diabetes mellitus, type 2 Hemoglobin A1C of 4.7%.  No need for pharmacologic treatment. Diet control.  ESRD Unknown baseline.  Patient is unsure of her primary nephrologist in [place].  Creatinine of 2.97 on admission.  Per patient, she was planned to start hemodialysis in addition to consideration for renal transplant.  Nephrology consulted on 12/20.  Worsening renal function in setting of attempted Lasix diuresis for heart failure. Nephrology now started hemodialysis. Plan: HD today via right chest Vas-Cath Nephrology looking for outpatient  hemodialysis chair  Acute diastolic heart failure New diagnosis. Grade 2 dysfunction. Normal EF.  Associated RV systolic dysfunction with elevated PA pressure seen on TTE although RHC on 12/28 significant for mildly elevated right heart/pulmonary artery pressures.  Net -8.7 L over admission Plan: Continue Lasix 40 mg IV twice daily Daily weights Strict I's and O's  Moderate pericardial effusion No clinical evidence of tamponade.  No recent infections.  In setting of CKD V with elevated BUN. No chest pain. Plan: -Cardiology recommendation: outpatient Transthoracic Echocardiogram for surveillance  Mild/moderate aortic stenosis Unsure if this is symptomatic.  Patient does have dyspnea with exertion but this may be multifactorial. -Cardiology recommendations: outpatient Transthoracic Echocardiogram for surveillance  Primary hypertension Patient is on at least hydrochlorothiazide as an outpatient but is unsure of her regimen.  Improved control -Losartan, carvedilol, amlodipine -Lasix as above  Anemia Possible related to underlying kidney disease.  Evidence of macrocytosis on CBC.  TIBC low but otherwise normal iron panel. Vitamin B12 and folate normal.  Hyperkalemia In setting of CKD. Lokelma 10 g given with improvement.  Pressure injury Mid coccyx, POA  History of tobacco use Patient has quit for at least 10 years. Previously, 20 pack year history.  Headache Normal chronic headache. Patient states she takes Imitrex as needed.   Obesity Body mass index is 31.15 kg/m.   DVT prophylaxis: Lovenox Code Status:   Code Status: Full Code Family Communication: None today Disposition Plan: Status is: Inpatient  Remains inpatient appropriate because:Unsafe d/c plan and Inpatient level of care appropriate due to severity of illness   Dispo: The patient is from: Home  Anticipated d/c is to: SNF              Anticipated d/c date is: 2 days              Patient  currently is not medically stable to d/c.   Hemodialysis today.  Attempting to find outpatient hemodialysis chair.  Once we have skilled nursing facility and hemodialysis chair in order and patient tolerating hemodialysis we can discharge to skilled nursing facility.        Consultants:   Nephrology  Cardiology  Vascular surgery  Procedures:   TRANSTHORACIC ECHOCARDIOGRAM (05/01/2020) IMPRESSIONS    1. Left ventricular ejection fraction, by estimation, is 55 to 60%. The  left ventricle has normal function. The left ventricle has no regional  wall motion abnormalities. There is severe concentric left ventricular  hypertrophy. Left ventricular diastolic  parameters are consistent with Grade II diastolic dysfunction  (pseudonormalization).  2. Right ventricular systolic function is moderately reduced. The right  ventricular size is normal. Mildly increased right ventricular wall  thickness. There is moderately elevated pulmonary artery systolic  pressure.  3. Left atrial size was mildly dilated.  4. Right atrial size was mildly dilated.  5. Moderate pericardial effusion. The pericardial effusion is  circumferential.  6. The mitral valve is abnormal. Mild to moderate mitral valve  regurgitation.  7. The tricuspid valve is abnormal. Tricuspid valve regurgitation is mild  to moderate.  8. The aortic valve is abnormal. Aortic valve regurgitation is mild to  moderate. Mild to moderate aortic valve stenosis.  RIGHT HEART CATHETERIZATION (05/10/2020) Conclusions: 1. Mildly elevated left heart, right heart, and pulmonary artery pressures. 2. Normal to supranormal Fick cardiac output/index.  Recommendations: 1. Continue gentle diuresis. 2. Consider further workup of potential causes of high-output heart failure.   Antimicrobials:  None    Subjective: Patient seen and examined.  Post right chest Vas-Cath placement.  Complains of some chest wall pain around  insertion site.  Objective: Vitals:   05/16/20 1000 05/16/20 1016 05/16/20 1030 05/16/20 1146  BP: 116/65 131/60 (!) 117/59 93/61  Pulse:  64 63 64  Resp: 18 14 13 17   Temp:    98 F (36.7 C)  TempSrc:    Oral  SpO2:  99% 98% 99%  Weight:      Height:        Intake/Output Summary (Last 24 hours) at 05/16/2020 1434 Last data filed at 05/16/2020 1416 Gross per 24 hour  Intake 240 ml  Output 800 ml  Net -560 ml   Filed Weights   05/15/20 0433 05/16/20 0349 05/16/20 0823  Weight: 86.7 kg 87.3 kg 87.5 kg    Examination:  General exam: Appears calm and comfortable Respiratory system: Clear to auscultation. Respiratory effort normal. Cardiovascular system: S1-S2, regular rate and rhythm, no murmurs, 2+ pitting edema, right chest Vas-Cath  gastrointestinal system: Abdomen is nondistended, soft and nontender. No organomegaly or masses felt. Normal bowel sounds heard. Central nervous system: Alert and oriented. No focal neurological deficits. Musculoskeletal: BLE pitting edema. No calf tenderness Skin: No cyanosis. No rashes Psychiatry: Judgement and insight appear normal. Mood & affect appropriate.   Data Reviewed: I have personally reviewed following labs and imaging studies  CBC Lab Results  Component Value Date   WBC 5.6 05/13/2020   RBC 2.84 (L) 05/13/2020   HGB 8.7 (L) 05/13/2020   HCT 28.1 (L) 05/13/2020   MCV 98.9 05/13/2020   MCH 30.6 05/13/2020   PLT 203 05/13/2020  MCHC 31.0 05/13/2020   RDW 13.1 17/79/3903     Last metabolic panel Lab Results  Component Value Date   NA 139 05/16/2020   K 4.3 05/16/2020   CL 105 05/16/2020   CO2 28 05/16/2020   BUN 49 (H) 05/16/2020   CREATININE 2.72 (H) 05/16/2020   GLUCOSE 72 05/16/2020   GFRNONAA 19 (L) 05/16/2020   CALCIUM 8.0 (L) 05/16/2020   PHOS 4.7 (H) 05/16/2020   PHOS 4.8 (H) 05/16/2020   PROT 5.6 (L) 05/02/2020   ALBUMIN 2.0 (L) 05/16/2020   LABGLOB 1.9 (L) 05/04/2020   AGRATIO 1.1 05/04/2020    BILITOT 0.9 04/30/2020   ALKPHOS 68 04/30/2020   AST 20 04/30/2020   ALT 26 04/30/2020   ANIONGAP 6 05/16/2020    CBG (last 3)  Recent Labs    05/16/20 0903 05/16/20 1000 05/16/20 1147  GLUCAP 82 100* 102*     GFR: Estimated Creatinine Clearance: 23.9 mL/min (A) (by C-G formula based on SCr of 2.72 mg/dL (H)).  Coagulation Profile: No results for input(s): INR, PROTIME in the last 168 hours.  No results found for this or any previous visit (from the past 240 hour(s)).      Radiology Studies: PERIPHERAL VASCULAR CATHETERIZATION  Result Date: 05/16/2020 See op note       Scheduled Meds: . amLODipine  10 mg Oral Daily  . benzonatate  200 mg Oral TID  . carvedilol  12.5 mg Oral BID WC  . Chlorhexidine Gluconate Cloth  6 each Topical Q0600  . feeding supplement (NEPRO CARB STEADY)  237 mL Oral BID BM  . furosemide  40 mg Intravenous Q12H  . heparin  5,000 Units Subcutaneous Q8H  . insulin aspart  0-9 Units Subcutaneous TID WC  . losartan  50 mg Oral Daily  . midazolam      . multivitamin  1 tablet Oral QHS  . polyethylene glycol  17 g Oral Daily  . senna-docusate  1 tablet Oral BID  . sodium chloride flush  10 mL Intravenous Q12H  . sodium chloride flush  3 mL Intravenous Q12H  . topiramate  25 mg Oral BID   Continuous Infusions: . sodium chloride       LOS: 15 days   Time spent: 15 minutes  Ralene Muskrat MD [facility] 05/16/2020, 2:34 PM  If 7PM-7AM, please contact night-coverage [email or web address]

## 2020-05-16 NOTE — Progress Notes (Signed)
PT Cancellation Note  Patient Details Name: Katie [name] MRN: [id number] DOB: May 31, 1955   Cancelled Treatment:    Reason Eval/Treat Not Completed: Patient at procedure or test/unavailable.  Pt currently at dialysis.  Will re-attempt PT session at a later date/time.  Leitha Bleak, PT 05/16/20, 4:15 PM

## 2020-05-16 NOTE — Op Note (Signed)
OPERATIVE NOTE    PRE-OPERATIVE DIAGNOSIS: 1. ESRD   POST-OPERATIVE DIAGNOSIS: same as above  PROCEDURE: 1. Ultrasound guidance for vascular access to the right internal jugular vein 2. Fluoroscopic guidance for placement of catheter 3. Placement of a 19 cm tip to cuff tunneled hemodialysis catheter via the right internal jugular vein  SURGEON: Leotis Pain, MD  ANESTHESIA:  Local with Moderate conscious sedation for approximately 24 minutes using 3 mg of Versed and 75 mcg of Fentanyl  ESTIMATED BLOOD LOSS: 10 cc  FLUORO TIME: less than one minute  CONTRAST: none  FINDING(S): 1.  Patent right internal jugular vein  SPECIMEN(S):  None  INDICATIONS:   Katie [name] is a 65 y.o.female who presents with renal failure.  The patient needs long term dialysis access for their ESRD, and a Permcath is necessary.  Risks and benefits are discussed and informed consent is obtained.    DESCRIPTION: After obtaining full informed written consent, the patient was brought back to the vascular suited. The patient's right neck and chest were sterilely prepped and draped in a sterile surgical field was created. Moderate conscious sedation was administered during a face to face encounter with the patient throughout the procedure with my supervision of the RN administering medicines and monitoring the patient's vital signs, pulse oximetry, telemetry and mental status throughout from the start of the procedure until the patient was taken to the recovery room.  The right internal jugular vein was visualized with ultrasound and found to be patent. It was then accessed under direct ultrasound guidance and a permanent image was recorded. A wire was placed. After skin nick and dilatation, the peel-away sheath was placed over the wire. I then turned my attention to an area under the clavicle. Approximately 1-2 fingerbreadths below the clavicle a small counterincision was created and tunneled from the subclavicular  incision to the access site. Using fluoroscopic guidance, a 19 centimeter tip to cuff tunneled hemodialysis catheter was selected, and tunneled from the subclavicular incision to the access site. It was then placed through the peel-away sheath and the peel-away sheath was removed. Using fluoroscopic guidance the catheter tips were parked in the right atrium. The appropriate distal connectors were placed. It withdrew blood well and flushed easily with heparinized saline and a concentrated heparin solution was then placed. It was secured to the chest wall with 2 Prolene sutures. The access incision was closed single 4-0 Monocryl. A 4-0 Monocryl pursestring suture was placed around the exit site. Sterile dressings were placed. The patient tolerated the procedure well and was taken to the recovery room in stable condition.  COMPLICATIONS: None  CONDITION: Stable  Leotis Pain, MD 05/16/2020 9:47 AM   This note was created with Dragon Medical transcription system. Any errors in dictation are purely unintentional.

## 2020-05-16 NOTE — Progress Notes (Signed)
05/13/20 1005 05/13/20 1015 05/13/20 1030  Vitals  Temp 99.5 F (37.5 C)  --   --   Temp Source Oral  --   --   BP (!) 157/69 (!) 156/71 (!) 146/69  MAP (mmHg) 93 95 92  BP Location Left Arm  --   --   BP Method Automatic  --   --   Patient Position (if appropriate) Lying  --   --   Pulse Rate 72 70 71  Pulse Rate Source Monitor  --   --   ECG Heart Rate 72 71 71  Resp 12 13 13   During Hemodialysis Assessment  Blood Flow Rate (mL/min) 300 mL/min 300 mL/min 300 mL/min  Arterial Pressure (mmHg) -100 mmHg -100 mmHg -100 mmHg  Venous Pressure (mmHg) 110 mmHg 110 mmHg 110 mmHg  Transmembrane Pressure (mmHg) 60 mmHg 60 mmHg 60 mmHg  Ultrafiltration Rate (mL/min) 330 mL/min 330 mL/min 330 mL/min  Dialysate Flow Rate (mL/min) 500 ml/min 500 ml/min 500 ml/min  Conductivity: Machine  14 14 14   HD Safety Checks Performed Yes Yes Yes  Dialysis Fluid Bolus Normal Saline  --   --   Bolus Amount (mL) 250 mL  --   --   Intra-Hemodialysis Comments Tx initiated Progressing as prescribed Progressing as prescribed    05/13/20 1045 05/13/20 1100 05/13/20 1115  Vitals  Temp  --   --   --   Temp Source  --   --   --   BP (!) 141/72 135/69 135/66  MAP (mmHg) 92 88 87  BP Location  --   --   --   BP Method  --   --   --   Patient Position (if appropriate)  --   --   --   Pulse Rate 71 71 70  Pulse Rate Source  --   --   --   ECG Heart Rate 71 71 71  Resp 13 13 14   During Hemodialysis Assessment  Blood Flow Rate (mL/min) 300 mL/min 300 mL/min 300 mL/min  Arterial Pressure (mmHg) -100 mmHg -100 mmHg -100 mmHg  Venous Pressure (mmHg) 110 mmHg 110 mmHg 110 mmHg  Transmembrane Pressure (mmHg) 60 mmHg 60 mmHg 60 mmHg  Ultrafiltration Rate (mL/min) 330 mL/min 330 mL/min 330 mL/min  Dialysate Flow Rate (mL/min) 500 ml/min 500 ml/min 500 ml/min  Conductivity: Machine  14 14 14   HD Safety Checks Performed Yes Yes Yes  Dialysis Fluid Bolus  --   --   --   Bolus Amount (mL)  --   --   --    Intra-Hemodialysis Comments Progressing as prescribed Progressing as prescribed Progressing as prescribed    05/13/20 1130 05/13/20 1145  Vitals  Temp  --   --   Temp Source  --   --   BP 135/67 (!) 143/66  MAP (mmHg) 87 88  BP Location  --   --   BP Method  --   --   Patient Position (if appropriate)  --   --   Pulse Rate 71 69  Pulse Rate Source  --   --   ECG Heart Rate 71 69  Resp 16 14  During Hemodialysis Assessment  Blood Flow Rate (mL/min) 300 mL/min 300 mL/min  Arterial Pressure (mmHg) -100 mmHg -100 mmHg  Venous Pressure (mmHg) 110 mmHg 110 mmHg  Transmembrane Pressure (mmHg) 60 mmHg 60 mmHg  Ultrafiltration Rate (mL/min) 330 mL/min 330 mL/min  Dialysate Flow Rate (mL/min) 500 ml/min  500 ml/min  Conductivity: Machine  14 14  HD Safety Checks Performed Yes Yes  Dialysis Fluid Bolus  --   --   Bolus Amount (mL)  --   --   Intra-Hemodialysis Comments Progressing as prescribed Progressing as prescribed

## 2020-05-16 NOTE — Progress Notes (Signed)
A&Ox4. VSS. O2 on 4L via [place] maintaining sats above 92%. C/o pain controlled with PRN percocet. Pure wick in place and changed out this shift. CHG bath completed. Patient kept NPO since MN for [facility] cath placement. Hourly rounding done. Frequent turned and repositioned encouraged. Call light kept within reach. Personal items kept within reach. Patient noted able to rest throughout the night after PRN melatonin and xanax  administered upon request. Will continue to monitor and endorse.

## 2020-05-16 NOTE — Progress Notes (Signed)
Pt stable for HD via [facility] ufg 1.5L

## 2020-05-16 NOTE — Progress Notes (Signed)
   05/13/20 1200 05/13/20 1215 05/13/20 1230  Vitals  BP (!) 148/73 (!) 142/75 (!) 152/75  MAP (mmHg) 95 94 98  Pulse Rate 69 66 72  ECG Heart Rate 69 67 72  Resp 15 16 17   During Hemodialysis Assessment  Blood Flow Rate (mL/min) 300 mL/min 300 mL/min 300 mL/min  Arterial Pressure (mmHg) -100 mmHg -100 mmHg -100 mmHg  Venous Pressure (mmHg) 110 mmHg 110 mmHg 110 mmHg  Transmembrane Pressure (mmHg) 60 mmHg 60 mmHg 60 mmHg  Ultrafiltration Rate (mL/min) 330 mL/min 330 mL/min 330 mL/min  Dialysate Flow Rate (mL/min) 500 ml/min 500 ml/min 500 ml/min  Conductivity: Machine  14 14 14   HD Safety Checks Performed Yes Yes Yes  Intra-Hemodialysis Comments Progressing as prescribed Progressing as prescribed Progressing as prescribed    05/13/20 1245 05/13/20 1300 05/13/20 1315  Vitals  BP (!) 154/72 (!) 155/77 (!) 161/74  MAP (mmHg) 94 98 97  Pulse Rate 68 70 70  ECG Heart Rate 69 69 70  Resp 14  --  14  During Hemodialysis Assessment  Blood Flow Rate (mL/min) 300 mL/min 300 mL/min 100 mL/min  Arterial Pressure (mmHg) -100 mmHg -100 mmHg -70 mmHg  Venous Pressure (mmHg) 110 mmHg 110 mmHg 50 mmHg  Transmembrane Pressure (mmHg) 60 mmHg 60 mmHg 20 mmHg  Ultrafiltration Rate (mL/min) 330 mL/min 330 mL/min 70 mL/min  Dialysate Flow Rate (mL/min) 500 ml/min 500 ml/min 500 ml/min  Conductivity: Machine  14 14 14   HD Safety Checks Performed Yes Yes Yes  Intra-Hemodialysis Comments Progressing as prescribed Progressing as prescribed Tx completed

## 2020-05-16 NOTE — Progress Notes (Signed)
05/12/20 1011 05/12/20 1015 05/12/20 1030  Vitals  Temp 99.4 F (37.4 C)  --   --   Temp Source Oral  --   --   BP  --  140/63 132/64  MAP (mmHg)  --  86 82  BP Location Right Arm  --   --   BP Method Automatic  --   --   Patient Position (if appropriate) Lying  --   --   Pulse Rate  --  70 67  Pulse Rate Source Monitor  --   --   ECG Heart Rate  --  70 68  Resp  --  13 12  During Hemodialysis Assessment  Blood Flow Rate (mL/min) 250 mL/min 250 mL/min 250 mL/min  Arterial Pressure (mmHg) -80 mmHg -80 mmHg -80 mmHg  Venous Pressure (mmHg) 70 mmHg 70 mmHg 70 mmHg  Transmembrane Pressure (mmHg) 60 mmHg 60 mmHg 60 mmHg  Ultrafiltration Rate (mL/min) 200 mL/min 200 mL/min 200 mL/min  Dialysate Flow Rate (mL/min) 500 ml/min 500 ml/min 500 ml/min  Conductivity: Machine  14 14 14   HD Safety Checks Performed Yes Yes Yes  Dialysis Fluid Bolus Normal Saline  --   --   Bolus Amount (mL) 250 mL  --   --   Intra-Hemodialysis Comments Tx initiated Progressing as prescribed Progressing as prescribed    05/12/20 1045 05/12/20 1100 05/12/20 1115  Vitals  Temp  --   --   --   Temp Source  --   --   --   BP 136/69 129/68 132/66  MAP (mmHg) 89 87 84  BP Location  --   --   --   BP Method  --   --   --   Patient Position (if appropriate)  --   --   --   Pulse Rate 70 69 68  Pulse Rate Source  --   --   --   ECG Heart Rate 71 70 69  Resp 10 (!) 9 10  During Hemodialysis Assessment  Blood Flow Rate (mL/min) 250 mL/min 250 mL/min 250 mL/min  Arterial Pressure (mmHg) -80 mmHg -80 mmHg -80 mmHg  Venous Pressure (mmHg) 70 mmHg 70 mmHg 70 mmHg  Transmembrane Pressure (mmHg) 60 mmHg 60 mmHg 60 mmHg  Ultrafiltration Rate (mL/min) 200 mL/min 200 mL/min 200 mL/min  Dialysate Flow Rate (mL/min) 500 ml/min 500 ml/min 500 ml/min  Conductivity: Machine  14 14 14   HD Safety Checks Performed Yes Yes Yes  Dialysis Fluid Bolus  --   --   --   Bolus Amount (mL)  --   --   --   Intra-Hemodialysis Comments  Progressing as prescribed Progressing as prescribed Progressing as prescribed    05/12/20 1130 05/12/20 1145  Vitals  Temp  --   --   Temp Source  --   --   BP 135/67 128/65  MAP (mmHg) 88 84  BP Location  --   --   BP Method  --   --   Patient Position (if appropriate)  --   --   Pulse Rate 69 68  Pulse Rate Source  --   --   ECG Heart Rate 69 68  Resp 10 (!) 9  During Hemodialysis Assessment  Blood Flow Rate (mL/min) 250 mL/min 250 mL/min  Arterial Pressure (mmHg) -80 mmHg -80 mmHg  Venous Pressure (mmHg) 70 mmHg 70 mmHg  Transmembrane Pressure (mmHg) 60 mmHg 60 mmHg  Ultrafiltration Rate (mL/min) 200 mL/min 200 mL/min  Dialysate Flow Rate (mL/min) 500 ml/min 500 ml/min  Conductivity: Machine  14 14  HD Safety Checks Performed Yes Yes  Dialysis Fluid Bolus  --   --   Bolus Amount (mL)  --   --   Intra-Hemodialysis Comments Progressing as prescribed Progressing as prescribed

## 2020-05-16 NOTE — Progress Notes (Signed)
PT Cancellation Note  Patient Details Name: Cassey Bacigalupo MRN: [id number] DOB: [birth year]   Cancelled Treatment:    Reason Eval/Treat Not Completed: Patient at procedure or test/unavailable.  Pt currently off floor at procedure.  Will re-attempt PT session at a later date/time.  Leitha Bleak, PT 05/16/20, 8:54 AM

## 2020-05-16 NOTE — Progress Notes (Signed)
Progress Note  Patient Name: Katie [name] Date of Encounter: 05/16/2020  Primary Cardiologist: [facility], Dr. Rockey Situ  Subjective   Reports significant pain s/p permacath placement this morning.  She states she does feel as if breathing status has improved; however, she states that it hurts to breathe after her PermCath placement, so she is unable to get deep breaths.  She reports chest pain 2/2 permacath.  Overall, she does feel as if her volume status continues to improve.  She reports she is scheduled for hemodialysis later today.  Inpatient Medications    Scheduled Meds:  amLODipine  10 mg Oral Daily   benzonatate  200 mg Oral TID   carvedilol  12.5 mg Oral BID WC   Chlorhexidine Gluconate Cloth  6 each Topical Q0600   feeding supplement (NEPRO CARB STEADY)  237 mL Oral BID BM   furosemide  40 mg Intravenous Q12H   heparin  5,000 Units Subcutaneous Q8H   insulin aspart  0-9 Units Subcutaneous TID WC   losartan  50 mg Oral Daily   midazolam       multivitamin  1 tablet Oral QHS   polyethylene glycol  17 g Oral Daily   senna-docusate  1 tablet Oral BID   sodium chloride flush  10 mL Intravenous Q12H   sodium chloride flush  3 mL Intravenous Q12H   topiramate  25 mg Oral BID   Continuous Infusions:  sodium chloride     PRN Meds: sodium chloride, acetaminophen **OR** acetaminophen, ALPRAZolam, bisacodyl, chlorpheniramine-HYDROcodone, melatonin, ondansetron **OR** ondansetron (ZOFRAN) IV, oxyCODONE-acetaminophen, promethazine, sodium chloride flush   Vital Signs    Vitals:   05/16/20 1000 05/16/20 1016 05/16/20 1030 05/16/20 1146  BP: 116/65 131/60 (!) 117/59 93/61  Pulse:  64 63 64  Resp: 18 14 13 17   Temp:    98 F (36.7 C)  TempSrc:    Oral  SpO2:  99% 98% 99%  Weight:      Height:        Intake/Output Summary (Last 24 hours) at 05/16/2020 1353 Last data filed at 05/16/2020 1024 Gross per 24 hour  Intake 240 ml  Output 800 ml  Net -560 ml    Last 3 Weights 05/16/2020 05/16/2020 05/15/2020  Weight (lbs) 193 lb 192 lb 7.4 oz 191 lb 3.2 oz  Weight (kg) 87.544 kg 87.3 kg 86.728 kg      Telemetry    NSR, 60s to 70s- Personally Reviewed  ECG    No new tracings- Personally Reviewed  Physical Exam   GEN: No acute distress.  At times appears in pain Neck:  JVD difficult to assess due to body habitus and patient position Cardiac: RRR, 2/6 systolic murmur in RUSB, rubs, or gallops.  Permacath placement on right side of chest. Respiratory:  Anterior auscultation only given patient reports she cannot sit up due to pain.  Coarse breath sounds bilaterally, worse on L.  Poor inspiratory effort. GI: Soft, nontender, non-distended  MS:1+ right lower extremity edema, moderate left lower extremity edema (states asymmetric finding is chronic); No deformity. Neuro:  Nonfocal  Psych: Normal affect   Labs    High Sensitivity Troponin:   Recent Labs  Lab 04/30/20 1601 04/30/20 2114  TROPONINIHS 24* 21*      Chemistry Recent Labs  Lab 05/14/20 0505 05/15/20 0546 05/16/20 0531  NA 138 139 139  K 4.2 4.3 4.3  CL 102 103 105  CO2 29 28 28   GLUCOSE 84 162* 72  BUN 33* 43* 49*  CREATININE 1.82* 2.35* 2.72*  CALCIUM 8.0* 8.3* 8.0*  ALBUMIN 2.2* 2.2* 2.0*  GFRNONAA 31* 23* 19*  ANIONGAP 7 8 6      Hematology Recent Labs  Lab 05/10/20 1224 05/13/20 0649  WBC 5.2 5.6  RBC 2.82* 2.84*  HGB 8.8* 8.7*  HCT 28.1* 28.1*  MCV 99.6 98.9  MCH 31.2 30.6  MCHC 31.3 31.0  RDW 13.3 13.1  PLT 175 203    BNPNo results for input(s): BNP, PROBNP in the last 168 hours.   DDimer No results for input(s): DDIMER in the last 168 hours.   Radiology    PERIPHERAL VASCULAR CATHETERIZATION  Result Date: 05/16/2020 See op note   Cardiac Studies   Echo 05/01/20 1. Left ventricular ejection fraction, by estimation, is 55 to 60%. The  left ventricle has normal function. The left ventricle has no regional  wall motion abnormalities.  There is severe concentric left ventricular  hypertrophy. Left ventricular diastolic  parameters are consistent with Grade II diastolic dysfunction  (pseudonormalization).  2. Right ventricular systolic function is moderately reduced. The right  ventricular size is normal. Mildly increased right ventricular wall  thickness. There is moderately elevated pulmonary artery systolic  pressure.  3. Left atrial size was mildly dilated.  4. Right atrial size was mildly dilated.  5. Moderate pericardial effusion. The pericardial effusion is  circumferential.  6. The mitral valve is abnormal. Mild to moderate mitral valve  regurgitation.  7. The tricuspid valve is abnormal. Tricuspid valve regurgitation is mild  to moderate.  8. The aortic valve is abnormal. Aortic valve regurgitation is mild to  moderate. Mild to moderate aortic valve stenosis.   [place] 05/10/20 Conclusions: 1. Mildly elevated left heart, right heart, and pulmonary artery pressures. 2. Normal to supranormal Fick cardiac output/index. Recommendations: 1. Continue gentle diuresis. 2. Consider further workup of potential causes of high-output heart failure.   Patient Profile     65 y.o. female with history of HFpEF, CKD stage IV, and hypertension presenting with increasing shortness of breath and edema s/p temporary hemodialysis with improvement in symptoms and cardiology consulted for acute on chronic HFpEF and pulmonary hypertension.  Assessment & Plan    [facility] HFpEF Pulmonary HTN --Reports improved breathing status since initiation of hemodialysis, though pain with breathing s/p PermCath insertion. Shortness of breath likely multifactorial given her comorbid pna, ESRD. exacerbation of volume status thought 2/2 poor BP control and advanced CKD. EF of 55 to 60%,G2DD, concentric LVH, NRWMA and as above.  S/p right-sided thoracentesis 12/20 and RHC 12/28 with mildly elevated right and left heart filling pressures and  elevated cardiac output..    Currently on IV diuresis.  Consider transition to oral lasix today/tomorrow.  Continue hemodialysis per nephrology for further volume management.  Volume up today on exam with patient reporting she has not yet had hemodialysis today and with plan to do so. ? Continue to monitor I's/O's, daily standing weights.  Net -177cc on 1/2.  Low output.  Wt 87.3kg  87.5kg.  Daily BMET. Monitor electrolytes.  Cr 2.35  2.72 with BUN 43  49.    AOCKD IV  ESRD --Permacath placed this morning.  Reports significant pain associated with port placement but improvement of symptoms since initiation of dialysis.  Per EMR, will likely need OP HD placement as likely now ESRD.  Daily BMET.  Continue to monitor electrolytes.  Further recommendations per nephrology.  Hypertension --Most recent BP soft.  She denies any dizziness.  With ongoing hypotension, consider decreasing / discontinuing amlodipine.  Titrate Coreg 12.5mg  BID and amlodipine 10 mg daily to prevent ongoing hypotension.  Started on losartan 50 mg 05/13/2020, which can be continued as BP allows.  Pleural effusion s/p thoracentesis --S/p 12/20 thoracentesis with 1.6L of yellow fluid removed for R lung with improvement in symptoms as previously noted.   Cytology negative for evidence of malignancy.  Pericardial effusion --Exam not consistent with tamponade. No indication at this time for pericardiocentesis. Possibly uremic in etiology, given advanced renal disease. Monitor closely. Recommend repeat limited echo in a few weeks to reassess and ensure stability.  Valvular disease --Continue to monitor with annual echo.   For questions or updates, please contact [place] Please consult [email or web address] for contact info under        Signed, Arvil Chaco, PA-C  05/16/2020, 1:53 PM

## 2020-05-16 NOTE — Progress Notes (Signed)
TX TERMINATED 1 HR EARLY DUE TO CLOTTED SYSTEM VP HIGH WITH CONTINUOUS ALARMS UNABLE TO RB BLOOD VITALS STABLE MD NOTIFIED

## 2020-05-16 NOTE — Progress Notes (Signed)
[facility]  ROUNDING NOTE   Subjective:   permcath placed this morning. Patient states since initiation of dialysis, she has started to feel better   Objective:  Vital signs in last 24 hours:  Temp:  [98 F (36.7 C)-98.7 F (37.1 C)] 98 F (36.7 C) (01/03 1146) Pulse Rate:  [63-74] 64 (01/03 1146) Resp:  [13-18] 17 (01/03 1146) BP: (93-161)/(57-80) 93/61 (01/03 1146) SpO2:  [96 %-100 %] 99 % (01/03 1146) Weight:  [87.3 kg-87.5 kg] 87.5 kg (01/03 0823)  Weight change: 0.572 kg Filed Weights   05/15/20 0433 05/16/20 0349 05/16/20 0823  Weight: 86.7 kg 87.3 kg 87.5 kg    Intake/Output: I/O last 3 completed shifts: In: 2706 [P.O.:1400; I.V.:3] Out: 1600 [Urine:1600]   Intake/Output this shift:  Total I/O In: -  Out: 300 [Urine:300]  Physical Exam: General: NAD,   Head: Normocephalic, atraumatic. Moist oral mucosal membranes  Eyes: Anicteric, PERRL  Neck: Supple, trachea midline  Lungs:  Clear to auscultation  Heart: Regular rate and rhythm  Abdomen:  Soft, nontender,   Extremities:  + peripheral edema.  Neurologic: Nonfocal, moving all four extremities  Skin: No lesions  Access: RIJ permcath 05/16/20 Dr. Lucky Cowboy    Basic Metabolic Panel: Recent Labs  Lab 05/12/20 0447 05/12/20 2376 05/13/20 2831 05/14/20 0505 05/15/20 0546 05/16/20 0531 05/16/20 0532  NA 141  --  141 138 139 139  --   K 5.2*  --  5.0 4.2 4.3 4.3  --   CL 108  --  107 102 103 105  --   CO2 26  --  28 29 28 28   --   GLUCOSE 97  --  92 84 162* 72  --   BUN 77*  --  53* 33* 43* 49*  --   CREATININE 2.94*  --  2.41* 1.82* 2.35* 2.72*  --   CALCIUM 7.9*  --  8.1* 8.0* 8.3* 8.0*  --   MG 1.8  --  1.9 1.8 1.9  --  1.8  PHOS 5.3* 4.8* 4.6 3.7 4.2 4.8*  4.7*  --     Liver Function Tests: Recent Labs  Lab 05/12/20 0447 05/13/20 0649 05/14/20 0505 05/15/20 0546 05/16/20 0531  ALBUMIN 2.2* 2.4* 2.2* 2.2* 2.0*   No results for input(s): LIPASE, AMYLASE in the last 168  hours. No results for input(s): AMMONIA in the last 168 hours.  CBC: Recent Labs  Lab 05/10/20 1224 05/13/20 0649  WBC 5.2 5.6  HGB 8.8* 8.7*  HCT 28.1* 28.1*  MCV 99.6 98.9  PLT 175 203    Cardiac Enzymes: No results for input(s): CKTOTAL, CKMB, CKMBINDEX, TROPONINI in the last 168 hours.  BNP: Invalid input(s): POCBNP  CBG: Recent Labs  Lab 05/15/20 2114 05/16/20 0823 05/16/20 0903 05/16/20 1000 05/16/20 1147  GLUCAP 146* 81 82 100* 102*    Microbiology: Results for orders placed or performed during the hospital encounter of 04/30/20  Resp Panel by RT-PCR (Flu A&B, Covid) Nasopharyngeal Swab     Status: None   Collection Time: 04/30/20  4:01 PM   Specimen: Nasopharyngeal Swab; Nasopharyngeal(NP) swabs in vial transport medium  Result Value Ref Range Status   SARS Coronavirus 2 by RT PCR NEGATIVE NEGATIVE Final    Comment: (NOTE) SARS-CoV-2 target nucleic acids are NOT DETECTED.  The SARS-CoV-2 RNA is generally detectable in upper respiratory specimens during the acute phase of infection. The lowest concentration of SARS-CoV-2 viral copies this assay can detect is 138 copies/mL. A  negative result does not preclude SARS-Cov-2 infection and should not be used as the sole basis for treatment or other patient management decisions. A negative result may occur with  improper specimen collection/handling, submission of specimen other than nasopharyngeal swab, presence of viral mutation(s) within the areas targeted by this assay, and inadequate number of viral copies(<138 copies/mL). A negative result must be combined with clinical observations, patient history, and epidemiological information. The expected result is Negative.  Fact Sheet for Patients:  EntrepreneurPulse.com.au  Fact Sheet for Healthcare Providers:  IncredibleEmployment.be  This test is no t yet approved or cleared by the [place] FDA and  has been  authorized for detection and/or diagnosis of SARS-CoV-2 by FDA under an Emergency Use Authorization (EUA). This EUA will remain  in effect (meaning this test can be used) for the duration of the COVID-19 declaration under Section 564(b)(1) of the Act, 21 U.S.C.section 360bbb-3(b)(1), unless the authorization is terminated  or revoked sooner.       Influenza A by PCR NEGATIVE NEGATIVE Final   Influenza B by PCR NEGATIVE NEGATIVE Final    Comment: (NOTE) The Xpert Xpress SARS-CoV-2/FLU/RSV plus assay is intended as an aid in the diagnosis of influenza from Nasopharyngeal swab specimens and should not be used as a sole basis for treatment. Nasal washings and aspirates are unacceptable for Xpert Xpress SARS-CoV-2/FLU/RSV testing.  Fact Sheet for Patients: EntrepreneurPulse.com.au  Fact Sheet for Healthcare Providers: IncredibleEmployment.be  This test is not yet approved or cleared by the [place] FDA and has been authorized for detection and/or diagnosis of SARS-CoV-2 by FDA under an Emergency Use Authorization (EUA). This EUA will remain in effect (meaning this test can be used) for the duration of the COVID-19 declaration under Section 564(b)(1) of the Act, 21 U.S.C. section 360bbb-3(b)(1), unless the authorization is terminated or revoked.  Performed at [facility], [address] [place], [place] [address]   Body fluid culture     Status: None   Collection Time: 05/02/20  4:44 PM   Specimen: PATH Cytology Pleural fluid  Result Value Ref Range Status   Specimen Description   Final    PLEURAL Performed at [facility], [address] [place], [place] [address]    Special Requests   Final    NONE Performed at [facility], [address] [place], [place] [address]    Gram Stain   Final    MODERATE WBC PRESENT, PREDOMINANTLY MONONUCLEAR NO ORGANISMS SEEN    Culture   Final    NO  GROWTH Performed at [facility], [place] [address] [place], [place] [address]    Report Status 05/06/2020 FINAL  Final    Coagulation Studies: No results for input(s): LABPROT, INR in the last 72 hours.  Urinalysis: No results for input(s): COLORURINE, LABSPEC, PHURINE, GLUCOSEU, HGBUR, BILIRUBINUR, KETONESUR, PROTEINUR, UROBILINOGEN, NITRITE, LEUKOCYTESUR in the last 72 hours.  Invalid input(s): APPERANCEUR    Imaging: PERIPHERAL VASCULAR CATHETERIZATION  Result Date: 05/16/2020 See op note    Medications:   . sodium chloride     . amLODipine  10 mg Oral Daily  . carvedilol  12.5 mg Oral BID WC  . Chlorhexidine Gluconate Cloth  6 each Topical Q0600  . feeding supplement (NEPRO CARB STEADY)  237 mL Oral BID BM  . furosemide  40 mg Intravenous Q12H  . heparin  5,000 Units Subcutaneous Q8H  . insulin aspart  0-9 Units Subcutaneous TID WC  . losartan  50  mg Oral Daily  . midazolam      . multivitamin  1 tablet Oral QHS  . polyethylene glycol  17 g Oral Daily  . senna-docusate  1 tablet Oral BID  . sodium chloride flush  10 mL Intravenous Q12H  . sodium chloride flush  3 mL Intravenous Q12H  . topiramate  25 mg Oral BID   sodium chloride, acetaminophen **OR** acetaminophen, ALPRAZolam, bisacodyl, guaiFENesin-dextromethorphan, melatonin, ondansetron **OR** ondansetron (ZOFRAN) IV, oxyCODONE-acetaminophen, promethazine, sodium chloride flush  Assessment/ Plan:  [name] is a 65 y.o. white female with diastolic congestive heart failure, hypertension, diabetes mellitus type II who is admitted to [facility] on 04/30/2020 for Chronic kidney disease (CKD), stage IV (severe) (HCC) [N18.4] Volume overload [E87.70]  Patient initiated on hemodialysis this admission.   1. Acute kidney injury on chronic kidney disease stage IV with hyperkalemia and proteinuria.  Will need outpatient hemodialysis placement.  Most likely has transitioned to end stage renal disease.   2.  Hypertension: well controlled. Current regimen of losartan, furosemide, carvedilol, amlodipine  3. Anemia of chronic kidney disease: hemoglobin 8.7.  SPEP/UPEP negative. Initiate ESA this admission.    LOS: 15 Georgio Hattabaugh 1/3/20221:24 PM

## 2020-05-16 NOTE — Interval H&P Note (Signed)
History and Physical Interval Note:  05/16/2020 9:07 AM  Katie [name]  has presented today for surgery, with the diagnosis of renal failure.  The various methods of treatment have been discussed with the patient and family. After consideration of risks, benefits and other options for treatment, the patient has consented to  Procedure(s): DIALYSIS/PERMA CATHETER INSERTION (N/A) as a surgical intervention.  The patient's history has been reviewed, patient examined, no change in status, stable for surgery.  I have reviewed the patient's chart and labs.  Questions were answered to the patient's satisfaction.     Leotis Pain

## 2020-05-17 LAB — CBC WITH DIFFERENTIAL/PLATELET
Abs Immature Granulocytes: 0.02 10*3/uL (ref 0.00–0.07)
Basophils Absolute: 0 10*3/uL (ref 0.0–0.1)
Basophils Relative: 1 %
Eosinophils Absolute: 0.1 10*3/uL (ref 0.0–0.5)
Eosinophils Relative: 2 %
HCT: 24.8 % — ABNORMAL LOW (ref 36.0–46.0)
Hemoglobin: 7.8 g/dL — ABNORMAL LOW (ref 12.0–15.0)
Immature Granulocytes: 0 %
Lymphocytes Relative: 32 %
Lymphs Abs: 1.9 10*3/uL (ref 0.7–4.0)
MCH: 30.8 pg (ref 26.0–34.0)
MCHC: 31.5 g/dL (ref 30.0–36.0)
MCV: 98 fL (ref 80.0–100.0)
Monocytes Absolute: 0.8 10*3/uL (ref 0.1–1.0)
Monocytes Relative: 13 %
Neutro Abs: 3.1 10*3/uL (ref 1.7–7.7)
Neutrophils Relative %: 52 %
Platelets: 215 10*3/uL (ref 150–400)
RBC: 2.53 MIL/uL — ABNORMAL LOW (ref 3.87–5.11)
RDW: 13.1 % (ref 11.5–15.5)
WBC: 5.9 10*3/uL (ref 4.0–10.5)
nRBC: 0 % (ref 0.0–0.2)

## 2020-05-17 LAB — RENAL FUNCTION PANEL
Albumin: 2.1 g/dL — ABNORMAL LOW (ref 3.5–5.0)
Anion gap: 6 (ref 5–15)
BUN: 43 mg/dL — ABNORMAL HIGH (ref 8–23)
CO2: 29 mmol/L (ref 22–32)
Calcium: 7.9 mg/dL — ABNORMAL LOW (ref 8.9–10.3)
Chloride: 105 mmol/L (ref 98–111)
Creatinine, Ser: 2.38 mg/dL — ABNORMAL HIGH (ref 0.44–1.00)
GFR, Estimated: 23 mL/min — ABNORMAL LOW (ref >60)
Glucose, Bld: 91 mg/dL (ref 70–99)
Phosphorus: 5.1 mg/dL — ABNORMAL HIGH (ref 2.5–4.6)
Potassium: 4.4 mmol/L (ref 3.5–5.1)
Sodium: 140 mmol/L (ref 135–145)

## 2020-05-17 LAB — GLUCOSE, CAPILLARY
Glucose-Capillary: 88 mg/dL (ref 70–99)
Glucose-Capillary: 104 mg/dL — ABNORMAL HIGH (ref 70–99)
Glucose-Capillary: 112 mg/dL — ABNORMAL HIGH (ref 70–99)
Glucose-Capillary: 126 mg/dL — ABNORMAL HIGH (ref 70–99)

## 2020-05-17 LAB — HEPATITIS B SURFACE ANTIGEN: Hepatitis B Surface Ag: NONREACTIVE

## 2020-05-17 LAB — MAGNESIUM: Magnesium: 1.8 mg/dL (ref 1.7–2.4)

## 2020-05-17 LAB — HEPATITIS B CORE ANTIBODY, IGM: Hep B C IgM: NONREACTIVE

## 2020-05-17 LAB — HEPATITIS B SURFACE ANTIBODY,QUALITATIVE: Hep B S Ab: NONREACTIVE

## 2020-05-17 MED ORDER — OXYCODONE-ACETAMINOPHEN 5-325 MG PO TABS
1.0000 | ORAL_TABLET | ORAL | Status: DC | PRN
  Administered 2020-05-17 – 2020-06-17 (×102): 1 via ORAL
  Filled 2020-05-17 (×105): qty 1

## 2020-05-17 MED ORDER — METHOCARBAMOL 500 MG PO TABS
500.0000 mg | ORAL_TABLET | Freq: Four times a day (QID) | ORAL | Status: DC
  Administered 2020-05-17 – 2020-06-21 (×127): 500 mg via ORAL
  Filled 2020-05-17 (×133): qty 1

## 2020-05-17 NOTE — Progress Notes (Signed)
Progress Note  Patient Name: Katie [name] Date of Encounter: 05/17/2020  [facility] Cardiologist: Dr. Rockey Situ  Subjective   Patient still feels fatigue and shortness of breath but improved from admission.  She complains of chest pain around her PermCath, as it had to be manipulated yesterday due to poor flows and quality during HD.  Inpatient Medications    Scheduled Meds: . amLODipine  5 mg Oral Daily  . benzonatate  200 mg Oral TID  . carvedilol  12.5 mg Oral BID WC  . Chlorhexidine Gluconate Cloth  6 each Topical Q0600  . feeding supplement (NEPRO CARB STEADY)  237 mL Oral BID BM  . furosemide  40 mg Intravenous Q12H  . heparin  5,000 Units Subcutaneous Q8H  . insulin aspart  0-9 Units Subcutaneous TID WC  . lidocaine  1 patch Transdermal Q24H  . losartan  50 mg Oral Daily  . multivitamin  1 tablet Oral QHS  . polyethylene glycol  17 g Oral Daily  . senna-docusate  1 tablet Oral BID  . sodium chloride flush  10 mL Intravenous Q12H  . sodium chloride flush  3 mL Intravenous Q12H  . topiramate  25 mg Oral BID   Continuous Infusions: . sodium chloride     PRN Meds: sodium chloride, acetaminophen **OR** acetaminophen, ALPRAZolam, bisacodyl, chlorpheniramine-HYDROcodone, melatonin, methocarbamol, ondansetron **OR** ondansetron (ZOFRAN) IV, oxyCODONE-acetaminophen, promethazine, sodium chloride flush   Vital Signs    Vitals:   05/17/20 0015 05/17/20 0546 05/17/20 0839 05/17/20 1229  BP:  (!) 142/61 (!) 162/73 (!) 149/59  Pulse:  72 74 70  Resp:   19 17  Temp:  99 F (37.2 C) 98.2 F (36.8 C) 98 F (36.7 C)  TempSrc:  Oral Oral Oral  SpO2:  99% 99% 96%  Weight: 86.9 kg     Height:        Intake/Output Summary (Last 24 hours) at 05/17/2020 1308 Last data filed at 05/17/2020 1045 Gross per 24 hour  Intake 312 ml  Output 1079 ml  Net -767 ml   Last 3 Weights 05/17/2020 05/16/2020 05/16/2020  Weight (lbs) 191 lb 8 oz 193 lb 192 lb 7.4 oz  Weight (kg) 86.864 kg 87.544  kg 87.3 kg      Telemetry    Sinus rhythm- Personally Reviewed  ECG    No new tracing.  Physical Exam   GEN: No acute distress.   Neck: No JVD Cardiac: RRR with 2/6 systolic murmur. Respiratory: Clear anteriorly.  Patient declines to sit up for posterior auscultation due to pain in her right chest around the permacath site. GI: Soft, nontender, non-distended  MS:  Trace ankle edema.; No deformity. Neuro:  Nonfocal  Psych: Normal affect   Labs    High Sensitivity Troponin:   Recent Labs  Lab 04/30/20 1601 04/30/20 2114  TROPONINIHS 24* 21*      Chemistry Recent Labs  Lab 05/15/20 0546 05/16/20 0531 05/17/20 0614  NA 139 139 140  K 4.3 4.3 4.4  CL 103 105 105  CO2 28 28 29   GLUCOSE 162* 72 91  BUN 43* 49* 43*  CREATININE 2.35* 2.72* 2.38*  CALCIUM 8.3* 8.0* 7.9*  ALBUMIN 2.2* 2.0* 2.1*  GFRNONAA 23* 19* 23*  ANIONGAP 8 6 6      Hematology Recent Labs  Lab 05/13/20 0649 05/17/20 0801  WBC 5.6 5.9  RBC 2.84* 2.53*  HGB 8.7* 7.8*  HCT 28.1* 24.8*  MCV 98.9 98.0  MCH 30.6 30.8  MCHC  31.0 31.5  RDW 13.1 13.1  PLT 203 215    BNPNo results for input(s): BNP, PROBNP in the last 168 hours.   DDimer No results for input(s): DDIMER in the last 168 hours.   Radiology    PERIPHERAL VASCULAR CATHETERIZATION  Result Date: 05/16/2020 See op note   Cardiac Studies   Echo 05/01/20 1. Left ventricular ejection fraction, by estimation, is 55 to 60%. The  left ventricle has normal function. The left ventricle has no regional  wall motion abnormalities. There is severe concentric left ventricular  hypertrophy. Left ventricular diastolic  parameters are consistent with Grade II diastolic dysfunction  (pseudonormalization).  2. Right ventricular systolic function is moderately reduced. The right  ventricular size is normal. Mildly increased right ventricular wall  thickness. There is moderately elevated pulmonary artery systolic  pressure.  3. Left  atrial size was mildly dilated.  4. Right atrial size was mildly dilated.  5. Moderate pericardial effusion. The pericardial effusion is  circumferential.  6. The mitral valve is abnormal. Mild to moderate mitral valve  regurgitation.  7. The tricuspid valve is abnormal. Tricuspid valve regurgitation is mild  to moderate.  8. The aortic valve is abnormal. Aortic valve regurgitation is mild to  moderate. Mild to moderate aortic valve stenosis.   [place] 05/10/20 Conclusions: 1. Mildly elevated left heart, right heart, and pulmonary artery pressures. 2. Normal to supranormal Fick cardiac output/index. Recommendations: 1. Continue gentle diuresis. 2. Consider further workup of potential causes of high-output heart failure.   Patient Profile     65 y.o. female with history of HFpEF, CKD stage IV, and hypertension presenting with increasing shortness of breath and edema s/p temporary hemodialysis with improvement in symptoms and cardiology consulted for acute on chronic HFpEF and pulmonary hypertension.  Assessment & Plan    Acute on chronic HFpEF and pulmonary hypertension: Symptoms slowly improving, though Katie [name] continues to complain of some dyspnea and fatigue.  She is currently on IV furosemide and undergoing dialysis.  Continue volume management per nephrology.  Hypertension: Blood pressure mildly to moderately elevated, though lower yesterday in the setting of her having received hemodialysis.  Amlodipine was decreased yesterday.  I will defer medication changes today.  Continue current doses of amlodipine, furosemide, and losartan; defer ongoing management to nephrology.  Pericardial effusion: Most likely due to ESRD and pulmonary hypertension.  Continue hemodialysis.  Agree with limited echo to follow-up pericardial effusion in 1 to 2 weeks.  [facility] will sign off.   Medication Recommendations: See above. Other recommendations (labs, testing, etc):  Limited echo to reassess pericardial effusion in 1 to 2 weeks. Follow up as an outpatient: Return to clinic with Dr. Rockey Situ work APP in approximately 2 weeks after discharge.  For questions or updates, please contact [place] Please consult [email or web address] for contact info under [facility].Angelina Sheriff, MD  05/17/2020, 1:08 PM

## 2020-05-17 NOTE — Progress Notes (Signed)
Removed right femoral Trialysis HD cath without complications. Vaseline gauze dsg applied, pressure held 10 minutes with no bleeding noted. Patient instructed to call RN if bleeding started. Tolerated well.

## 2020-05-17 NOTE — Progress Notes (Signed)
Mobility Specialist - Progress Note   05/17/20 1500  Mobility  Range of Motion/Exercises Left leg  Level of Assistance Standby assist, set-up cues, supervision of patient - no hands on  Assistive Device None  Distance Ambulated (ft) 0 ft  Mobility Response Tolerated well  Mobility performed by Mobility specialist  $Mobility charge 1 Mobility    Pre-mobility: 76 HR, 98% SpO2 Post-mobility: 77 HR, 93% SpO2   Pt was lying in bed upon arrival utilizing room air. Pt agreed to session. Pt denied nausea or fatigue, but did c/o pain in UB where perm cath placement is located. Pt teary-eyed and stated, "nothing works, nothing is helping me feel better." RN entered room at the beginning of session to administer pain medication. Per chart review, temp cath recently removed from R femoral. Pt performed exercises on LLE only. Pt completed ankle pumps x10, straight leg raises x10, hip abduction x10, and quad sets x10. Overall, pt tolerated session well. Pt was left in bed with all needs in reach and alarm set.    Kathee Delton Mobility Specialist 05/17/20, 3:26 PM

## 2020-05-17 NOTE — Progress Notes (Signed)
[facility]  ROUNDING NOTE   Subjective:   Complains of permcath insertion site.  Hemodialysis treatment yesterday. Tolerated treatment well. UF of 657mL.   Objective:  Vital signs in last 24 hours:  Temp:  [98 F (36.7 C)-99.4 F (37.4 C)] 98.2 F (36.8 C) (01/04 0839) Pulse Rate:  [63-74] 74 (01/04 0839) Resp:  [11-19] 19 (01/04 0839) BP: (97-162)/(52-85) 162/73 (01/04 0839) SpO2:  [98 %-99 %] 99 % (01/04 0839) Weight:  [86.9 kg] 86.9 kg (01/04 0015)  Weight change: 0.244 kg Filed Weights   05/16/20 0349 05/16/20 0823 05/17/20 0015  Weight: 87.3 kg 87.5 kg 86.9 kg    Intake/Output: I/O last 3 completed shifts: In: 44 [P.O.:72] Out: 1879 [Urine:1250; Other:629]   Intake/Output this shift:  Total I/O In: 240 [P.O.:240] Out: -   Physical Exam: General: NAD,   Head: Normocephalic, atraumatic. Moist oral mucosal membranes  Eyes: Anicteric, PERRL  Neck: Supple, trachea midline  Lungs:  Clear to auscultation  Heart: Regular rate and rhythm  Abdomen:  Soft, nontender,   Extremities:  no peripheral edema.  Neurologic: Nonfocal, moving all four extremities  Skin: No lesions  Access: RIJ permcath 05/16/20 Dr. Lucky Cowboy    Basic Metabolic Panel: Recent Labs  Lab 05/13/20 6144 05/14/20 0505 05/15/20 3154 05/16/20 0531 05/16/20 0532 05/17/20 0614  NA 141 138 139 139  --  140  K 5.0 4.2 4.3 4.3  --  4.4  CL 107 102 103 105  --  105  CO2 28 29 28 28   --  29  GLUCOSE 92 84 162* 72  --  91  BUN 53* 33* 43* 49*  --  43*  CREATININE 2.41* 1.82* 2.35* 2.72*  --  2.38*  CALCIUM 8.1* 8.0* 8.3* 8.0*  --  7.9*  MG 1.9 1.8 1.9  --  1.8 1.8  PHOS 4.6 3.7 4.2 4.8*  4.7*  --  5.1*    Liver Function Tests: Recent Labs  Lab 05/13/20 0649 05/14/20 0505 05/15/20 0546 05/16/20 0531 05/17/20 0614  ALBUMIN 2.4* 2.2* 2.2* 2.0* 2.1*   No results for input(s): LIPASE, AMYLASE in the last 168 hours. No results for input(s): AMMONIA in the last 168  hours.  CBC: Recent Labs  Lab 05/13/20 0649 05/17/20 0801  WBC 5.6 5.9  NEUTROABS  --  3.1  HGB 8.7* 7.8*  HCT 28.1* 24.8*  MCV 98.9 98.0  PLT 203 215    Cardiac Enzymes: No results for input(s): CKTOTAL, CKMB, CKMBINDEX, TROPONINI in the last 168 hours.  BNP: Invalid input(s): POCBNP  CBG: Recent Labs  Lab 05/16/20 1147 05/16/20 1828 05/16/20 2052 05/17/20 0834 05/17/20 1226  GLUCAP 102* 113* 101* 88 104*    Microbiology: Results for orders placed or performed during the hospital encounter of 04/30/20  Resp Panel by RT-PCR (Flu A&B, Covid) Nasopharyngeal Swab     Status: None   Collection Time: 04/30/20  4:01 PM   Specimen: Nasopharyngeal Swab; Nasopharyngeal(NP) swabs in vial transport medium  Result Value Ref Range Status   SARS Coronavirus 2 by RT PCR NEGATIVE NEGATIVE Final    Comment: (NOTE) SARS-CoV-2 target nucleic acids are NOT DETECTED.  The SARS-CoV-2 RNA is generally detectable in upper respiratory specimens during the acute phase of infection. The lowest concentration of SARS-CoV-2 viral copies this assay can detect is 138 copies/mL. A negative result does not preclude SARS-Cov-2 infection and should not be used as the sole basis for treatment or other patient management decisions. A negative result  may occur with  improper specimen collection/handling, submission of specimen other than nasopharyngeal swab, presence of viral mutation(s) within the areas targeted by this assay, and inadequate number of viral copies(<138 copies/mL). A negative result must be combined with clinical observations, patient history, and epidemiological information. The expected result is Negative.  Fact Sheet for Patients:  EntrepreneurPulse.com.au  Fact Sheet for Healthcare Providers:  IncredibleEmployment.be  This test is no t yet approved or cleared by the [place] FDA and  has been authorized for detection and/or diagnosis  of SARS-CoV-2 by FDA under an Emergency Use Authorization (EUA). This EUA will remain  in effect (meaning this test can be used) for the duration of the COVID-19 declaration under Section 564(b)(1) of the Act, 21 U.S.C.section 360bbb-3(b)(1), unless the authorization is terminated  or revoked sooner.       Influenza A by PCR NEGATIVE NEGATIVE Final   Influenza B by PCR NEGATIVE NEGATIVE Final    Comment: (NOTE) The Xpert Xpress SARS-CoV-2/FLU/RSV plus assay is intended as an aid in the diagnosis of influenza from Nasopharyngeal swab specimens and should not be used as a sole basis for treatment. Nasal washings and aspirates are unacceptable for Xpert Xpress SARS-CoV-2/FLU/RSV testing.  Fact Sheet for Patients: EntrepreneurPulse.com.au  Fact Sheet for Healthcare Providers: IncredibleEmployment.be  This test is not yet approved or cleared by the [place] FDA and has been authorized for detection and/or diagnosis of SARS-CoV-2 by FDA under an Emergency Use Authorization (EUA). This EUA will remain in effect (meaning this test can be used) for the duration of the COVID-19 declaration under Section 564(b)(1) of the Act, 21 U.S.C. section 360bbb-3(b)(1), unless the authorization is terminated or revoked.  Performed at [facility], [address] [place], [place] [address]   Body fluid culture     Status: None   Collection Time: 05/02/20  4:44 PM   Specimen: PATH Cytology Pleural fluid  Result Value Ref Range Status   Specimen Description   Final    PLEURAL Performed at [facility], [address], [place], [place] [address]    Special Requests   Final    NONE Performed at [facility], [address] [place], [place] [address]    Gram Stain   Final    MODERATE WBC PRESENT, PREDOMINANTLY MONONUCLEAR NO ORGANISMS SEEN    Culture   Final    NO GROWTH Performed at [facility], [place] [address], [place], [place] [address]    Report Status 05/06/2020 FINAL  Final  MRSA PCR Screening     Status: None   Collection Time: 05/16/20  9:29 PM   Specimen: Nasal Mucosa; Nasopharyngeal  Result Value Ref Range Status   MRSA by PCR NEGATIVE NEGATIVE Final    Comment:        The GeneXpert MRSA Assay (FDA approved for NASAL specimens only), is one component of a comprehensive MRSA colonization surveillance program. It is not intended to diagnose MRSA infection nor to guide or monitor treatment for MRSA infections. Performed at [facility], [address] [place], [place] [address]     Coagulation Studies: No results for input(s): LABPROT, INR in the last 72 hours.  Urinalysis: No results for input(s): COLORURINE, LABSPEC, PHURINE, GLUCOSEU, HGBUR, BILIRUBINUR, KETONESUR, PROTEINUR, UROBILINOGEN, NITRITE, LEUKOCYTESUR in the last 72 hours.  Invalid input(s): APPERANCEUR    Imaging: PERIPHERAL VASCULAR CATHETERIZATION  Result Date: 05/16/2020 See op note    Medications:   . sodium chloride     .  amLODipine  5 mg Oral Daily  . benzonatate  200 mg Oral TID  . carvedilol  12.5 mg Oral BID WC  . Chlorhexidine Gluconate Cloth  6 each Topical Q0600  . feeding supplement (NEPRO CARB STEADY)  237 mL Oral BID BM  . furosemide  40 mg Intravenous Q12H  . heparin  5,000 Units Subcutaneous Q8H  . insulin aspart  0-9 Units Subcutaneous TID WC  . lidocaine  1 patch Transdermal Q24H  . losartan  50 mg Oral Daily  . multivitamin  1 tablet Oral QHS  . polyethylene glycol  17 g Oral Daily  . senna-docusate  1 tablet Oral BID  . sodium chloride flush  10 mL Intravenous Q12H  . sodium chloride flush  3 mL Intravenous Q12H  . topiramate  25 mg Oral BID   sodium chloride, acetaminophen **OR** acetaminophen, ALPRAZolam, bisacodyl, chlorpheniramine-HYDROcodone, melatonin, methocarbamol, ondansetron **OR** ondansetron (ZOFRAN) IV, oxyCODONE-acetaminophen, promethazine, sodium  chloride flush  Assessment/ Plan:  Katie [name] is a 65 y.o. white female with diastolic congestive heart failure, hypertension, diabetes mellitus type II who is admitted to [facility] on 04/30/2020 for Chronic kidney disease (CKD), stage IV (severe) ([place]) [N18.4] Volume overload [E87.70]  Patient initiated on hemodialysis this admission.   1. Acute kidney injury on chronic kidney disease stage IV with hyperkalemia and proteinuria.  Will need outpatient hemodialysis placement.  Most likely has transitioned to end stage renal disease.  Dialysis for tomorrow.   2. Hypertension: well controlled. Current regimen of losartan, furosemide, carvedilol, amlodipine  3. Anemia of chronic kidney disease: hemoglobin 7.8.  SPEP/UPEP negative. Initiate ESA this admission.    LOS: [place] 1/4/202212:28 PM

## 2020-05-17 NOTE — Progress Notes (Signed)
PROGRESS NOTE    Katie [name]  WVP:[id number] DOB: May 26, 1955 DOA: 04/30/2020 PCP: Patient, [name]   Brief Narrative: Leighana Neyman is a 65 y.o. female with a history of diabetes mellitus type 2, stage IV CKD, heart failure, hypertension. Patient presented secondary to dyspnea with evidence of volume overload and pleural effusion.  1/3: PermCath placed in right chest 1/4: Patient endorsing significant pain around PermCath insertion site   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)   Hyperkalemia   Pressure injury of skin   Pleural effusion   Acute heart failure with preserved ejection fraction (HFpEF) (HCC)   Chronic kidney disease (CKD), stage IV (severe) (HCC)   Pulmonary hypertension, unspecified (HCC)  Volume overload In setting of CKD. Cardiomegaly on chest x-ray.  Started on Lasix IV on admission but has now progressed to requiring hemodialysis. CKD likely progressed to ESRD Plan: Nephrology following for hemodialysis PermCath placed in right chest Plan for repeat hemodialysis tomorrow Nephrology looking for outpatient HD chair  Pleural effusion In setting of fluid overload with new diagnosis of grade 2 diastolic heart failure.  No previous history of pleural effusion.  Diagnostic right thoracentesis performed on 12/20.  No evidence of malignancy on cytology  Diabetes mellitus, type 2 Hemoglobin A1C of 4.7%.  No need for pharmacologic treatment. Diet control.  ESRD Unknown baseline.  Patient is unsure of her primary nephrologist in [place].  Creatinine of 2.97 on admission.  Per patient, she was planned to start hemodialysis in addition to consideration for renal transplant.  Nephrology consulted on 12/20.  Worsening renal function in setting of attempted Lasix diuresis for heart  failure. Nephrology now started hemodialysis. Plan: Last HD 05/16/2020 Next HD 05/18/2020 Nephrology looking for outpatient hemodialysis chair  Acute diastolic heart failure New diagnosis. Grade 2 dysfunction. Normal EF.  Associated RV systolic dysfunction with elevated PA pressure seen on TTE although RHC on 12/28 significant for mildly elevated right heart/pulmonary artery pressures.  Net -8.7 L over admission Plan: Continue Lasix 40 mg IV twice daily Daily weights Strict I's and O's  Moderate pericardial effusion No clinical evidence of tamponade.  No recent infections.  In setting of CKD V with elevated BUN. No chest pain. Plan: -Cardiology recommendation: outpatient Transthoracic Echocardiogram for surveillance  Mild/moderate aortic stenosis Unsure if this is symptomatic.  Patient does have dyspnea with exertion but this may be multifactorial. -Cardiology recommendations: outpatient Transthoracic Echocardiogram for surveillance  Primary hypertension Patient is on at least hydrochlorothiazide as an outpatient but is unsure of her regimen.  Improved control -Losartan, carvedilol, amlodipine -Lasix as above  Anemia Possible related to underlying kidney disease.  Evidence of macrocytosis on CBC.  TIBC low but otherwise normal iron panel. Vitamin B12 and folate normal.  Hyperkalemia In setting of CKD. Lokelma 10 g given with improvement.  Pressure injury Mid coccyx, POA  History of tobacco use Patient has quit for at least 10 years. Previously, 20 pack year history.  Headache Normal chronic headache. Patient states she takes Imitrex as needed.   Obesity Body mass index is 30.91 kg/m.   DVT prophylaxis: Lovenox Code Status:   Code Status: Full Code Family Communication: None today Disposition Plan: Status is: Inpatient  Remains inpatient appropriate because:Unsafe d/c plan and Inpatient level of care appropriate due to severity  of illness   Dispo: The patient  is from: Home              Anticipated d/c is to: SNF              Anticipated d/c date is: 2 days              Patient currently is not medically stable to d/c.   Hemodialysis tomorrow. Nephrology looking for outpatient HD chair. Removing femoral catheter today. Patient will need to be able to sit up in the chair prior to being prefer outpatient hemodialysis. Anticipate discharge in 48 hours. Discharge to skilled nursing facility.        Consultants:   Nephrology  Cardiology  Vascular surgery  Procedures:   TRANSTHORACIC ECHOCARDIOGRAM (05/01/2020) IMPRESSIONS    1. Left ventricular ejection fraction, by estimation, is 55 to 60%. The  left ventricle has normal function. The left ventricle has no regional  wall motion abnormalities. There is severe concentric left ventricular  hypertrophy. Left ventricular diastolic  parameters are consistent with Grade II diastolic dysfunction  (pseudonormalization).  2. Right ventricular systolic function is moderately reduced. The right  ventricular size is normal. Mildly increased right ventricular wall  thickness. There is moderately elevated pulmonary artery systolic  pressure.  3. Left atrial size was mildly dilated.  4. Right atrial size was mildly dilated.  5. Moderate pericardial effusion. The pericardial effusion is  circumferential.  6. The mitral valve is abnormal. Mild to moderate mitral valve  regurgitation.  7. The tricuspid valve is abnormal. Tricuspid valve regurgitation is mild  to moderate.  8. The aortic valve is abnormal. Aortic valve regurgitation is mild to  moderate. Mild to moderate aortic valve stenosis.  RIGHT HEART CATHETERIZATION (05/10/2020) Conclusions: 1. Mildly elevated left heart, right heart, and pulmonary artery pressures. 2. Normal to supranormal Fick cardiac output/index.  Recommendations: 1. Continue gentle diuresis. 2. Consider further workup of potential causes of high-output  heart failure.   Antimicrobials:  None    Subjective: Patient seen and examined. Endorses fatigue. Shortness of breath improved. Endorsing pain surrounding PermCath insertion site. Back  Objective: Vitals:   05/17/20 0546 05/17/20 0839 05/17/20 1229 05/17/20 1515  BP: (!) 142/61 (!) 162/73 (!) 149/59 (!) 145/71  Pulse: 72 74 70 76  Resp:  19 17 19   Temp: 99 F (37.2 C) 98.2 F (36.8 C) 98 F (36.7 C) 98 F (36.7 C)  TempSrc: Oral Oral Oral Oral  SpO2: 99% 99% 96% 96%  Weight:      Height:        Intake/Output Summary (Last 24 hours) at 05/17/2020 1529 Last data filed at 05/17/2020 1407 Gross per 24 hour  Intake 432 ml  Output 1079 ml  Net -647 ml   Filed Weights   05/16/20 0349 05/16/20 0823 05/17/20 0015  Weight: 87.3 kg 87.5 kg 86.9 kg    Examination:  General exam: Appears calm and comfortable Respiratory system: Clear to auscultation. Respiratory effort normal. Cardiovascular system: S1-S2, regular rate and rhythm, no murmurs, 2+ pitting edema, right chest Vas-Cath  gastrointestinal system: Abdomen is nondistended, soft and nontender. No organomegaly or masses felt. Normal bowel sounds heard. Central nervous system: Alert and oriented. No focal neurological deficits. Musculoskeletal: BLE pitting edema. No calf tenderness Skin: No cyanosis. No rashes Psychiatry: Judgement and insight appear normal. Mood & affect appropriate.   Data Reviewed: I have personally reviewed following labs and imaging studies  CBC Lab Results  Component Value Date  WBC 5.9 05/17/2020   RBC 2.53 (L) 05/17/2020   HGB 7.8 (L) 05/17/2020   HCT 24.8 (L) 05/17/2020   MCV 98.0 05/17/2020   MCH 30.8 05/17/2020   PLT 215 05/17/2020   MCHC 31.5 05/17/2020   RDW 13.1 05/17/2020   LYMPHSABS 1.9 05/17/2020   MONOABS 0.8 05/17/2020   EOSABS 0.1 05/17/2020   BASOSABS 0.0 19/37/9024     Last metabolic panel Lab Results  Component Value Date   NA 140 05/17/2020   K 4.4 05/17/2020    CL 105 05/17/2020   CO2 29 05/17/2020   BUN 43 (H) 05/17/2020   CREATININE 2.38 (H) 05/17/2020   GLUCOSE 91 05/17/2020   GFRNONAA 23 (L) 05/17/2020   CALCIUM 7.9 (L) 05/17/2020   PHOS 5.1 (H) 05/17/2020   PROT 5.6 (L) 05/02/2020   ALBUMIN 2.1 (L) 05/17/2020   LABGLOB 1.9 (L) 05/04/2020   AGRATIO 1.1 05/04/2020   BILITOT 0.9 04/30/2020   ALKPHOS 68 04/30/2020   AST 20 04/30/2020   ALT 26 04/30/2020   ANIONGAP 6 05/17/2020    CBG (last 3)  Recent Labs    05/16/20 2052 05/17/20 0834 05/17/20 1226  GLUCAP 101* 88 104*     GFR: Estimated Creatinine Clearance: 27.2 mL/min (A) (by C-G formula based on SCr of 2.38 mg/dL (H)).  Coagulation Profile: No results for input(s): INR, PROTIME in the last 168 hours.  Recent Results (from the past 240 hour(s))  MRSA PCR Screening     Status: None   Collection Time: 05/16/20  9:29 PM   Specimen: Nasal Mucosa; Nasopharyngeal  Result Value Ref Range Status   MRSA by PCR NEGATIVE NEGATIVE Final    Comment:        The GeneXpert MRSA Assay (FDA approved for NASAL specimens only), is one component of a comprehensive MRSA colonization surveillance program. It is not intended to diagnose MRSA infection nor to guide or monitor treatment for MRSA infections. Performed at [facility], [address] [place], [place] [address]         Radiology Studies: PERIPHERAL VASCULAR CATHETERIZATION  Result Date: 05/16/2020 See op note       Scheduled Meds: . amLODipine  5 mg Oral Daily  . benzonatate  200 mg Oral TID  . carvedilol  12.5 mg Oral BID WC  . Chlorhexidine Gluconate Cloth  6 each Topical Q0600  . feeding supplement (NEPRO CARB STEADY)  237 mL Oral BID BM  . furosemide  40 mg Intravenous Q12H  . heparin  5,000 Units Subcutaneous Q8H  . insulin aspart  0-9 Units Subcutaneous TID WC  . lidocaine  1 patch Transdermal Q24H  . losartan  50 mg Oral Daily  . methocarbamol  500 mg Oral QID  . multivitamin  1  tablet Oral QHS  . polyethylene glycol  17 g Oral Daily  . senna-docusate  1 tablet Oral BID  . sodium chloride flush  10 mL Intravenous Q12H  . sodium chloride flush  3 mL Intravenous Q12H  . topiramate  25 mg Oral BID   Continuous Infusions: . sodium chloride       LOS: 16 days   Time spent: 15 minutes  Ralene Muskrat MD [facility] 05/17/2020, 3:29 PM  If 7PM-7AM, please contact night-coverage [email or web address]

## 2020-05-18 ENCOUNTER — Ambulatory Visit: Payer: Self-pay | Admitting: Family

## 2020-05-18 LAB — CBC WITH DIFFERENTIAL/PLATELET
Abs Immature Granulocytes: 0.01 10*3/uL (ref 0.00–0.07)
Basophils Absolute: 0 10*3/uL (ref 0.0–0.1)
Basophils Relative: 1 %
Eosinophils Absolute: 0.1 10*3/uL (ref 0.0–0.5)
Eosinophils Relative: 2 %
HCT: 23.6 % — ABNORMAL LOW (ref 36.0–46.0)
Hemoglobin: 7.2 g/dL — ABNORMAL LOW (ref 12.0–15.0)
Immature Granulocytes: 0 %
Lymphocytes Relative: 34 %
Lymphs Abs: 2 10*3/uL (ref 0.7–4.0)
MCH: 30.1 pg (ref 26.0–34.0)
MCHC: 30.5 g/dL (ref 30.0–36.0)
MCV: 98.7 fL (ref 80.0–100.0)
Monocytes Absolute: 0.8 10*3/uL (ref 0.1–1.0)
Monocytes Relative: 13 %
Neutro Abs: 2.9 10*3/uL (ref 1.7–7.7)
Neutrophils Relative %: 50 %
Platelets: 217 10*3/uL (ref 150–400)
RBC: 2.39 MIL/uL — ABNORMAL LOW (ref 3.87–5.11)
RDW: 12.9 % (ref 11.5–15.5)
WBC: 5.8 10*3/uL (ref 4.0–10.5)
nRBC: 0 % (ref 0.0–0.2)

## 2020-05-18 LAB — FOLATE: Folate: 21.2 ng/mL (ref >5.9)

## 2020-05-18 LAB — GLUCOSE, CAPILLARY
Glucose-Capillary: 85 mg/dL (ref 70–99)
Glucose-Capillary: 109 mg/dL — ABNORMAL HIGH (ref 70–99)
Glucose-Capillary: 116 mg/dL — ABNORMAL HIGH (ref 70–99)
Glucose-Capillary: 112 mg/dL — ABNORMAL HIGH (ref 70–99)

## 2020-05-18 LAB — RENAL FUNCTION PANEL
Albumin: 2.1 g/dL — ABNORMAL LOW (ref 3.5–5.0)
Anion gap: 8 (ref 5–15)
BUN: 49 mg/dL — ABNORMAL HIGH (ref 8–23)
CO2: 28 mmol/L (ref 22–32)
Calcium: 8 mg/dL — ABNORMAL LOW (ref 8.9–10.3)
Chloride: 104 mmol/L (ref 98–111)
Creatinine, Ser: 2.8 mg/dL — ABNORMAL HIGH (ref 0.44–1.00)
GFR, Estimated: 19 mL/min — ABNORMAL LOW
Glucose, Bld: 91 mg/dL (ref 70–99)
Phosphorus: 5.8 mg/dL — ABNORMAL HIGH (ref 2.5–4.6)
Potassium: 4.5 mmol/L (ref 3.5–5.1)
Sodium: 140 mmol/L (ref 135–145)

## 2020-05-18 LAB — VITAMIN B12: Vitamin B-12: 358 pg/mL (ref 180–914)

## 2020-05-18 LAB — MAGNESIUM: Magnesium: 1.9 mg/dL (ref 1.7–2.4)

## 2020-05-18 NOTE — Progress Notes (Signed)
Mobility Specialist - Progress Note   05/18/20 1324  Mobility  Activity Refused mobility  Mobility performed by Mobility specialist    Pt politely declined session at this time. States her "entire body is feeling achy" and she's having "8/10" pain in her "neck and back". States she is "very tired" after PT session earlier. Nurse notified. Will re-attempt tomorrow if appropriate.    Finnley Lewis Mobility Specialist  05/18/20, 1:26 PM

## 2020-05-18 NOTE — Progress Notes (Signed)
[facility] Progress Note  Patient: Katie [name]    JJO:[id number]  DOA: 04/30/2020     Date of Service: the patient was seen and examined on 05/18/2020  Chief Complaint  Patient presents with  . Weakness   Brief hospital course: Katie [name] is a 65 y.o. female with a history of diabetes mellitus type 2, stage IV CKD, heart failure, hypertension. Patient presented secondary to dyspnea with evidence of volume overload and pleural effusion.  1/3: PermCath placed in right chest 1/4: Patient endorsing significant pain around PermCath insertion site   Currently further plan is to continue hemodialysis as per nephrology, transfuse PRBC if hemoglobin drops below 7 Patient has no insurance, awaiting for placement  Assessment and Plan: Principal Problem:   Volume overload Active Problems:   Type 2 diabetes mellitus with stage 5 chronic kidney disease ([place])   Hypertension   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HCC)   Hyperkalemia   Pressure injury of skin   Pleural effusion   Acute heart failure with preserved ejection fraction (HFpEF) (HCC)   Chronic kidney disease (CKD), stage IV (severe) (HCC)   Pulmonary hypertension, unspecified (HCC)  Volume overload In setting of CKD. Cardiomegaly on chest x-ray.  Started on Lasix IV on admission but has now progressed to requiring hemodialysis. CKD likely progressed to ESRD Plan: Nephrology following for hemodialysis PermCath placed in right chest Plan for repeat hemodialysis tomorrow Nephrology looking for outpatient HD chair  Pleural effusion In setting of fluid overload with new diagnosis of grade 2 diastolic heart failure.  No previous history of pleural effusion.  Diagnostic right thoracentesis performed on 12/20.  No evidence of malignancy on cytology  Diabetes mellitus, type 2 Hemoglobin A1C of 4.7%.  No need for pharmacologic treatment. Diet control.  ESRD Unknown baseline.  Patient is unsure of her  primary nephrologist in [place].  Creatinine of 2.97 on admission.  Per patient, she was planned to start hemodialysis in addition to consideration for renal transplant.  Nephrology consulted on 12/20.  Worsening renal function in setting of attempted Lasix diuresis for heart failure. Nephrology now started hemodialysis. Plan: Last HD 05/16/2020 Next HD 05/18/2020 Nephrology looking for outpatient hemodialysis chair  Acute diastolic heart failure New diagnosis. Grade 2 dysfunction. Normal EF.  Associated RV systolic dysfunction with elevated PA pressure seen on TTE although RHC on 12/28 significant for mildly elevated right heart/pulmonary artery pressures.  Net -8.7 L over admission Plan: Continue Lasix 40 mg IV twice daily Daily weights Strict I's and O's  Moderate pericardial effusion No clinical evidence of tamponade.  No recent infections.  In setting of CKD V with elevated BUN. No chest pain. Plan: -Cardiology recommendation: outpatient Transthoracic Echocardiogram for surveillance  Mild/moderate aortic stenosis Unsure if this is symptomatic.  Patient does have dyspnea with exertion but this may be multifactorial. -Cardiology recommendations: outpatient Transthoracic Echocardiogram for surveillance  Primary hypertension Patient is on at least hydrochlorothiazide as an outpatient but is unsure of her regimen.  Improved control -Losartan, carvedilol, amlodipine -Lasix as above  Anemia Possible related to underlying kidney disease.  Evidence of macrocytosis on CBC.  TIBC low but otherwise normal iron panel. Vitamin B12 and folate normal.  Hyperkalemia In setting of CKD. Lokelma 10 g given with improvement.  Pressure injury Mid coccyx, POA  History of tobacco use Patient has quit for at least 10 years. Previously, 20 pack year history.  Headache Normal chronic headache. Patient states she takes Imitrex as needed.  Obesity Body mass index is 30.91  kg/m.   Body mass index is 30.36 kg/m.  Nutrition Problem: Increased nutrient needs Etiology: chronic illness (CKD IV with temp HD, CHF) Interventions:    Pressure Injury 04/30/20 Coccyx Mid Stage 2 -  Partial thickness loss of dermis presenting as a shallow open injury with a red, pink wound bed without slough. (Active)  04/30/20 2130  Location: Coccyx  Location Orientation: Mid  Staging: Stage 2 -  Partial thickness loss of dermis presenting as a shallow open injury with a red, pink wound bed without slough.  Wound Description (Comments):   Present on Admission: Yes     Diet: Renal diet DVT Prophylaxis: Subcutaneous Heparin    Advance goals of care discussion: Full code  Family Communication: family was not present at bedside, at the time of interview.  The pt provided permission to discuss medical plan with the family. Opportunity was given to ask question and all questions were answered satisfactorily.   Disposition:  Pt is from home, admitted with renal failure, ESRD on hemodialysis, still requires hemodialysis, does not have insurance so awaiting for SNF placement and insurance application  Discharge to SNF vs friend's house after insurance plication and hemodialysis set up done as an outpatient versus SNF placement.  Subjective: No significant acute overnight issues, patient was still complaining of pain at the catheter site, also complaining of some headache, advised to take Tylenol. Patient denies any chest pain or palpitations, no shortness of breath.  Physical Exam: General:  alert oriented to time, place, and person.  Appear in no distress, affect appropriate Eyes: PERRLA ENT: Oral Mucosa Clear, moist  Neck: no JVD,  Cardiovascular: S1 and S2 Present, no Murmur,  Respiratory: good respiratory effort, Bilateral Air entry equal and Decreased, no Crackles, no wheezes Abdomen: Bowel Sound present, Soft and no tenderness,  Skin: No rashes Extremities: no Pedal  edema, no calf tenderness Neurologic: without any new focal findings Gait not checked due to patient safety concerns  Vitals:   05/18/20 0024 05/18/20 0503 05/18/20 0736 05/18/20 1233  BP: 139/66 122/60 (!) 156/67 (!) 165/76  Pulse: 75 73 72 68  Resp: 16 16 17 18   Temp: 98.4 F (36.9 C) 98.2 F (36.8 C) 98.3 F (36.8 C) 98.1 F (36.7 C)  TempSrc: Oral  Oral Oral  SpO2: 99% 98% 99% 99%  Weight:  85.3 kg    Height:        Intake/Output Summary (Last 24 hours) at 05/18/2020 1525 Last data filed at 05/18/2020 1429 Gross per 24 hour  Intake 480 ml  Output 1650 ml  Net -1170 ml   Filed Weights   05/16/20 0823 05/17/20 0015 05/18/20 0503  Weight: 87.5 kg 86.9 kg 85.3 kg    Data Reviewed: I have personally reviewed and interpreted daily labs, tele strips, imagings as discussed above. I reviewed all nursing notes, pharmacy notes, vitals, pertinent old records I have discussed plan of care as described above with RN and patient/family.  CBC: Recent Labs  Lab 05/13/20 0649 05/17/20 0801 05/18/20 0417  WBC 5.6 5.9 5.8  NEUTROABS  --  3.1 2.9  HGB 8.7* 7.8* 7.2*  HCT 28.1* 24.8* 23.6*  MCV 98.9 98.0 98.7  PLT 203 215 016   Basic Metabolic Panel: Recent Labs  Lab 05/14/20 0505 05/15/20 0546 05/16/20 0531 05/16/20 0532 05/17/20 0614 05/18/20 0417  NA 138 139 139  --  140 140  K 4.2 4.3 4.3  --  4.4 4.5  CL 102 103 105  --  105 104  CO2 29 28 28   --  29 28  GLUCOSE 84 162* 72  --  91 91  BUN 33* 43* 49*  --  43* 49*  CREATININE 1.82* 2.35* 2.72*  --  2.38* 2.80*  CALCIUM 8.0* 8.3* 8.0*  --  7.9* 8.0*  MG 1.8 1.9  --  1.8 1.8 1.9  PHOS 3.7 4.2 4.8*  4.7*  --  5.1* 5.8*    Studies: No results found.  Scheduled Meds: . amLODipine  5 mg Oral Daily  . benzonatate  200 mg Oral TID  . carvedilol  12.5 mg Oral BID WC  . Chlorhexidine Gluconate Cloth  6 each Topical Q0600  . feeding supplement (NEPRO CARB STEADY)  237 mL Oral BID BM  . furosemide  40 mg Intravenous  Q12H  . heparin  5,000 Units Subcutaneous Q8H  . insulin aspart  0-9 Units Subcutaneous TID WC  . lidocaine  1 patch Transdermal Q24H  . losartan  50 mg Oral Daily  . methocarbamol  500 mg Oral QID  . multivitamin  1 tablet Oral QHS  . polyethylene glycol  17 g Oral Daily  . senna-docusate  1 tablet Oral BID  . sodium chloride flush  3 mL Intravenous Q12H  . topiramate  25 mg Oral BID   Continuous Infusions: . sodium chloride     PRN Meds: sodium chloride, acetaminophen **OR** acetaminophen, ALPRAZolam, bisacodyl, chlorpheniramine-HYDROcodone, melatonin, ondansetron **OR** ondansetron (ZOFRAN) IV, oxyCODONE-acetaminophen, promethazine, sodium chloride flush  Time spent: 35 minutes  Author: Val Riles. MD [facility] 05/18/2020 3:25 PM  To reach On-call, see care teams to locate the attending and reach out to them via [email or web address]. If 7PM-7AM, please contact night-coverage If you still have difficulty reaching the attending provider, please page the [facility] (Director on Call) for [facility] on amion for assistance.

## 2020-05-18 NOTE — Evaluation (Signed)
Physical Therapy Re-Evaluation Patient Details Name: Katie [name] MRN: [id number] DOB: Oct 02, 1955 Today's Date: 05/18/2020   History of Present Illness  Patient is a 65 y.o. female with a history of diabetes mellitus type 2, stage IV CKD, heart failure, hypertension. Patient presented secondary to dyspnea with evidence of volume overload and pleural effusion. Diagnosed with moderate pericardial effusion, mild to moderate aortic insufficiency and aortic stenosis.  S/p R heart cath 12/28.  R temp fem cath placed 12/29; 1/3 R IJ permcath placed; 1/4 R temp fem cath removed.  Clinical Impression  PT re-evaluation performed d/t extended hospitalization.  Prior to hospital admission, pt was ambulatory with 4ww.  Currently pt is min assist semi-supine to sitting edge of bed; mod assist progressing to min assist with transfers; and min assist to ambulate a few feet bed to recliner with RW use.  Generalized weakness and decreased activity tolerance noted with sessions activities.  6/10 R IJ permcath soreness during session.  Pt would benefit from skilled PT to address noted impairments and functional limitations (see below for any additional details).  Upon hospital discharge, pt would benefit from STR.  Plan of care reviewed and updated as appropriate.    Follow Up Recommendations SNF    Equipment Recommendations  Rolling walker with 5" wheels;3in1 (PT)    Recommendations for Other Services OT consult     Precautions / Restrictions Precautions Precautions: Fall Precaution Comments: R IJ permcath Restrictions Weight Bearing Restrictions: No      Mobility  Bed Mobility Overal bed mobility: Needs Assistance Bed Mobility: Supine to Sit     Supine to sit: Min assist;HOB elevated     General bed mobility comments: assist for trunk; increased effort to perform    Transfers Overall transfer level: Needs assistance Equipment used: Rolling walker (2 wheeled) Transfers: Sit to/from Stand Sit  to Stand: Mod assist;Min assist         General transfer comment: mod assist 1st trial standing and min assist 2nd trial standing from bed; vc's for UE/LE placement and overall technique  Ambulation/Gait Ambulation/Gait assistance: Min assist Gait Distance (Feet): 3 Feet (3 feet bed to recliner with RW; 4x10 reps taking steps in place B LE's (standing with B UE support on RW)) Assistive device: Rolling walker (2 wheeled)   Gait velocity: decreased   General Gait Details: pt intermittently unsteady requiring assist for balance  Stairs            Wheelchair Mobility    Modified Rankin (Stroke Patients Only)       Balance Overall balance assessment: Needs assistance Sitting-balance support: No upper extremity supported;Feet supported Sitting balance-Leahy Scale: Good Sitting balance - Comments: steady sitting reaching within BOS   Standing balance support: Bilateral upper extremity supported Standing balance-Leahy Scale: Fair Standing balance comment: initially min assist for standing balance d/t posterior lean; improved to CGA to intermittent min assist for standing balance with dynamic standing activities                             Pertinent Vitals/Pain   Vitals (HR and O2 on 1.5 L via nasal cannula) stable and WFL throughout treatment session.    Home Living Family/patient expects to be discharged to:: Private residence Living Arrangements: Other (Comment) (friend) Available Help at Discharge: Friend(s);Available PRN/intermittently Type of Home: House Home Access: Stairs to enter Entrance Stairs-Rails: Can reach both Entrance Stairs-Number of Steps: 3 Home Layout: One level Home Equipment: Gilford Rile -  4 wheels      Prior Function Level of Independence: Needs assistance   Gait / Transfers Assistance Needed: Ambulates household distances with 4ww  ADL's / Homemaking Assistance Needed: Per initial PT eval "Patient reports she needs assistance with  ADLs at baseline"  Comments: Per inital PT eval: "Patient also reports she has had 30 falls in the past 6 months and has been in and out of rehabilitation facilities this year"     Hand Dominance        Extremity/Trunk Assessment   Upper Extremity Assessment Upper Extremity Assessment: Generalized weakness    Lower Extremity Assessment Lower Extremity Assessment: Generalized weakness    Cervical / Trunk Assessment Cervical / Trunk Assessment: Other exceptions (forward head/shoulders)  Communication   Communication: No difficulties  Cognition Arousal/Alertness: Awake/alert Behavior During Therapy: WFL for tasks assessed/performed Overall Cognitive Status: Within Functional Limits for tasks assessed                                 General Comments: Pt appearing motivated to participate in therapy      General Comments   Nursing cleared pt for participation in physical therapy.  Pt agreeable to PT session.    Exercises Total Joint Exercises Ankle Circles/Pumps: AROM;Strengthening;Both;Seated (10 reps x2) Long Arc Quad: AROM;Strengthening;Both;Seated (10 reps x2) General Exercises - Lower Extremity Hip Flexion/Marching: AROM;Strengthening;Both;Seated (10 reps x2)   Assessment/Plan    PT Assessment Patient needs continued PT services  PT Problem List Decreased strength;Decreased activity tolerance;Decreased balance;Decreased mobility;Decreased knowledge of use of DME;Decreased safety awareness;Decreased knowledge of precautions;Pain       PT Treatment Interventions Gait training;DME instruction;Stair training;Functional mobility training;Therapeutic activities;Therapeutic exercise;Balance training;Patient/family education    PT Goals (Current goals can be found in the Care Plan section)  Acute Rehab PT Goals Patient Stated Goal: to go home PT Goal Formulation: With patient Time For Goal Achievement: 06/01/20 Potential to Achieve Goals: Fair     Frequency Min 2X/week   Barriers to discharge Decreased caregiver support      Co-evaluation               AM-PAC PT "6 Clicks" Mobility  Outcome Measure Help needed turning from your back to your side while in a flat bed without using bedrails?: A Little Help needed moving from lying on your back to sitting on the side of a flat bed without using bedrails?: A Little Help needed moving to and from a bed to a chair (including a wheelchair)?: A Little Help needed standing up from a chair using your arms (e.g., wheelchair or bedside chair)?: A Lot Help needed to walk in hospital room?: A Little Help needed climbing 3-5 steps with a railing? : A Lot 6 Click Score: 16    End of Session Equipment Utilized During Treatment: Gait belt;Oxygen Activity Tolerance: Patient limited by fatigue Patient left: in chair;with call bell/phone within reach;with chair alarm set;Other (comment) (B heels floating via pillow support) Nurse Communication: Mobility status;Precautions PT Visit Diagnosis: Unsteadiness on feet (R26.81);Muscle weakness (generalized) (M62.81);History of falling (Z91.81);Other abnormalities of gait and mobility (R26.89)    Time: 3419-6222 PT Time Calculation (min) (ACUTE ONLY): 29 min   Charges:   PT Evaluation $PT Re-evaluation: 1 Re-eval PT Treatments $Therapeutic Activity: 8-22 mins       Leitha Bleak, PT 05/18/20, 2:07 PM

## 2020-05-18 NOTE — Progress Notes (Signed)
Contacted about getting patient set up with an outpatient dialysis clinic. Spoke with and educated patient today. Referral sent to [facility]. Patient does not have insurance, this will cause a delay in clinic acceptance. Please contact me with any dialysis placement concerns.  [name] Dialysis Coordinator [phone]

## 2020-05-18 NOTE — Progress Notes (Signed)
[facility]  ROUNDING NOTE   Subjective:   Creatinine 2.8 (2.38) UOP 1650 Patient states she is feeling well.   Objective:  Vital signs in last 24 hours:  Temp:  [98 F (36.7 C)-98.4 F (36.9 C)] 98.1 F (36.7 C) (01/05 1233) Pulse Rate:  [68-76] 68 (01/05 1233) Resp:  [16-19] 18 (01/05 1233) BP: (122-165)/(54-76) 165/76 (01/05 1233) SpO2:  [96 %-99 %] 99 % (01/05 1233) Weight:  [85.3 kg] 85.3 kg (01/05 0503)  Weight change: -2.223 kg Filed Weights   05/16/20 0823 05/17/20 0015 05/18/20 0503  Weight: 87.5 kg 86.9 kg 85.3 kg    Intake/Output: I/O last 3 completed shifts: In: 104 [P.O.:432] Out: 1950 [Urine:1950]   Intake/Output this shift:  Total I/O In: 240 [P.O.:240] Out: -   Physical Exam: General: NAD,   Head: Normocephalic, atraumatic. Moist oral mucosal membranes  Eyes: Anicteric, PERRL  Neck: Supple, trachea midline  Lungs:  Clear to auscultation  Heart: Regular rate and rhythm  Abdomen:  Soft, nontender,   Extremities:  no peripheral edema.  Neurologic: Nonfocal, moving all four extremities  Skin: No lesions  Access: RIJ permcath 05/16/20 Dr. Lucky Cowboy    Basic Metabolic Panel: Recent Labs  Lab 05/14/20 0505 05/15/20 6767 05/16/20 0531 05/16/20 0532 05/17/20 0614 05/18/20 0417  NA 138 139 139  --  140 140  K 4.2 4.3 4.3  --  4.4 4.5  CL 102 103 105  --  105 104  CO2 29 28 28   --  29 28  GLUCOSE 84 162* 72  --  91 91  BUN 33* 43* 49*  --  43* 49*  CREATININE 1.82* 2.35* 2.72*  --  2.38* 2.80*  CALCIUM 8.0* 8.3* 8.0*  --  7.9* 8.0*  MG 1.8 1.9  --  1.8 1.8 1.9  PHOS 3.7 4.2 4.8*  4.7*  --  5.1* 5.8*    Liver Function Tests: Recent Labs  Lab 05/14/20 0505 05/15/20 0546 05/16/20 0531 05/17/20 0614 05/18/20 0417  ALBUMIN 2.2* 2.2* 2.0* 2.1* 2.1*   No results for input(s): LIPASE, AMYLASE in the last 168 hours. No results for input(s): AMMONIA in the last 168 hours.  CBC: Recent Labs  Lab 05/13/20 0649 05/17/20 0801  05/18/20 0417  WBC 5.6 5.9 5.8  NEUTROABS  --  3.1 2.9  HGB 8.7* 7.8* 7.2*  HCT 28.1* 24.8* 23.6*  MCV 98.9 98.0 98.7  PLT 203 215 217    Cardiac Enzymes: No results for input(s): CKTOTAL, CKMB, CKMBINDEX, TROPONINI in the last 168 hours.  BNP: Invalid input(s): POCBNP  CBG: Recent Labs  Lab 05/17/20 1226 05/17/20 1704 05/17/20 2037 05/18/20 0734 05/18/20 1228  GLUCAP 104* 112* 126* 85 109*    Microbiology: Results for orders placed or performed during the hospital encounter of 04/30/20  Resp Panel by RT-PCR (Flu A&B, Covid) Nasopharyngeal Swab     Status: None   Collection Time: 04/30/20  4:01 PM   Specimen: Nasopharyngeal Swab; Nasopharyngeal(NP) swabs in vial transport medium  Result Value Ref Range Status   SARS Coronavirus 2 by RT PCR NEGATIVE NEGATIVE Final    Comment: (NOTE) SARS-CoV-2 target nucleic acids are NOT DETECTED.  The SARS-CoV-2 RNA is generally detectable in upper respiratory specimens during the acute phase of infection. The lowest concentration of SARS-CoV-2 viral copies this assay can detect is 138 copies/mL. A negative result does not preclude SARS-Cov-2 infection and should not be used as the sole basis for treatment or other patient management decisions.  A negative result may occur with  improper specimen collection/handling, submission of specimen other than nasopharyngeal swab, presence of viral mutation(s) within the areas targeted by this assay, and inadequate number of viral copies(<138 copies/mL). A negative result must be combined with clinical observations, patient history, and epidemiological information. The expected result is Negative.  Fact Sheet for Patients:  EntrepreneurPulse.com.au  Fact Sheet for Healthcare Providers:  IncredibleEmployment.be  This test is no t yet approved or cleared by the [place] FDA and  has been authorized for detection and/or diagnosis of SARS-CoV-2  by FDA under an Emergency Use Authorization (EUA). This EUA will remain  in effect (meaning this test can be used) for the duration of the COVID-19 declaration under Section 564(b)(1) of the Act, 21 U.S.C.section 360bbb-3(b)(1), unless the authorization is terminated  or revoked sooner.       Influenza A by PCR NEGATIVE NEGATIVE Final   Influenza B by PCR NEGATIVE NEGATIVE Final    Comment: (NOTE) The Xpert Xpress SARS-CoV-2/FLU/RSV plus assay is intended as an aid in the diagnosis of influenza from Nasopharyngeal swab specimens and should not be used as a sole basis for treatment. Nasal washings and aspirates are unacceptable for Xpert Xpress SARS-CoV-2/FLU/RSV testing.  Fact Sheet for Patients: EntrepreneurPulse.com.au  Fact Sheet for Healthcare Providers: IncredibleEmployment.be  This test is not yet approved or cleared by the [place] FDA and has been authorized for detection and/or diagnosis of SARS-CoV-2 by FDA under an Emergency Use Authorization (EUA). This EUA will remain in effect (meaning this test can be used) for the duration of the COVID-19 declaration under Section 564(b)(1) of the Act, 21 U.S.C. section 360bbb-3(b)(1), unless the authorization is terminated or revoked.  Performed at [facility], [address] [place], [place] [address]   Body fluid culture     Status: None   Collection Time: 05/02/20  4:44 PM   Specimen: PATH Cytology Pleural fluid  Result Value Ref Range Status   Specimen Description   Final    PLEURAL Performed at [facility], [address] [place], [place] [address]    Special Requests   Final    NONE Performed at [facility], [address] [place], [place] [address]    Gram Stain   Final    MODERATE WBC PRESENT, PREDOMINANTLY MONONUCLEAR NO ORGANISMS SEEN    Culture   Final    NO GROWTH Performed at [facility], [place] [address]  [place], [place] [address]    Report Status 05/06/2020 FINAL  Final  MRSA PCR Screening     Status: None   Collection Time: 05/16/20  9:29 PM   Specimen: Nasal Mucosa; Nasopharyngeal  Result Value Ref Range Status   MRSA by PCR NEGATIVE NEGATIVE Final    Comment:        The GeneXpert MRSA Assay (FDA approved for NASAL specimens only), is one component of a comprehensive MRSA colonization surveillance program. It is not intended to diagnose MRSA infection nor to guide or monitor treatment for MRSA infections. Performed at [facility], [address] [place], [place] [address]     Coagulation Studies: No results for input(s): LABPROT, INR in the last 72 hours.  Urinalysis: No results for input(s): COLORURINE, LABSPEC, PHURINE, GLUCOSEU, HGBUR, BILIRUBINUR, KETONESUR, PROTEINUR, UROBILINOGEN, NITRITE, LEUKOCYTESUR in the last 72 hours.  Invalid input(s): APPERANCEUR    Imaging: No results found.   Medications:   . sodium chloride     . amLODipine  5 mg Oral Daily  . benzonatate  200 mg Oral TID  . carvedilol  12.5 mg Oral BID WC  . Chlorhexidine Gluconate Cloth  6 each Topical Q0600  . feeding supplement (NEPRO CARB STEADY)  237 mL Oral BID BM  . furosemide  40 mg Intravenous Q12H  . heparin  5,000 Units Subcutaneous Q8H  . insulin aspart  0-9 Units Subcutaneous TID WC  . lidocaine  1 patch Transdermal Q24H  . losartan  50 mg Oral Daily  . methocarbamol  500 mg Oral QID  . multivitamin  1 tablet Oral QHS  . polyethylene glycol  17 g Oral Daily  . senna-docusate  1 tablet Oral BID  . sodium chloride flush  3 mL Intravenous Q12H  . topiramate  25 mg Oral BID   sodium chloride, acetaminophen **OR** acetaminophen, ALPRAZolam, bisacodyl, chlorpheniramine-HYDROcodone, melatonin, ondansetron **OR** ondansetron (ZOFRAN) IV, oxyCODONE-acetaminophen, promethazine, sodium chloride flush  Assessment/ Plan:  [name] is a 65 y.o. white female with diastolic  congestive heart failure, hypertension, diabetes mellitus type II who is admitted to [facility] on 04/30/2020 for Chronic kidney disease (CKD), stage IV (severe) ([place]) [N18.4] Volume overload [E87.70]  Patient initiated on hemodialysis this admission.   1. Acute kidney injury on chronic kidney disease stage IV with hyperkalemia and proteinuria.  Holding dialysis. Monitor for renal recovery.   2. Hypertension: well controlled. Current regimen of losartan, furosemide, carvedilol, amlodipine  3. Anemia of chronic kidney disease: hemoglobin 7.2.  SPEP/UPEP negative. Initiate ESA this admission.    LOS: [place] 1/5/20222:11 PM

## 2020-05-18 NOTE — Plan of Care (Signed)
  Problem: Clinical Measurements: Goal: Will remain free from infection Outcome: Progressing   Problem: Clinical Measurements: Goal: Respiratory complications will improve Outcome: Progressing   Problem: Pain Managment: Goal: General experience of comfort will improve Outcome: Progressing   Problem: Safety: Goal: Ability to remain free from injury will improve Outcome: Progressing   

## 2020-05-19 LAB — BASIC METABOLIC PANEL
Anion gap: 8 (ref 5–15)
BUN: 52 mg/dL — ABNORMAL HIGH (ref 8–23)
CO2: 28 mmol/L (ref 22–32)
Calcium: 8 mg/dL — ABNORMAL LOW (ref 8.9–10.3)
Chloride: 105 mmol/L (ref 98–111)
Creatinine, Ser: 3.13 mg/dL — ABNORMAL HIGH (ref 0.44–1.00)
GFR, Estimated: 16 mL/min — ABNORMAL LOW (ref >60)
Glucose, Bld: 95 mg/dL (ref 70–99)
Potassium: 4.6 mmol/L (ref 3.5–5.1)
Sodium: 141 mmol/L (ref 135–145)

## 2020-05-19 LAB — CBC
HCT: 24.9 % — ABNORMAL LOW (ref 36.0–46.0)
Hemoglobin: 7.6 g/dL — ABNORMAL LOW (ref 12.0–15.0)
MCH: 30.6 pg (ref 26.0–34.0)
MCHC: 30.5 g/dL (ref 30.0–36.0)
MCV: 100.4 fL — ABNORMAL HIGH (ref 80.0–100.0)
Platelets: 227 10*3/uL (ref 150–400)
RBC: 2.48 MIL/uL — ABNORMAL LOW (ref 3.87–5.11)
RDW: 13.1 % (ref 11.5–15.5)
WBC: 6.3 10*3/uL (ref 4.0–10.5)
nRBC: 0 % (ref 0.0–0.2)

## 2020-05-19 LAB — GLUCOSE, CAPILLARY
Glucose-Capillary: 86 mg/dL (ref 70–99)
Glucose-Capillary: 131 mg/dL — ABNORMAL HIGH (ref 70–99)
Glucose-Capillary: 126 mg/dL — ABNORMAL HIGH (ref 70–99)
Glucose-Capillary: 122 mg/dL — ABNORMAL HIGH (ref 70–99)

## 2020-05-19 LAB — PHOSPHORUS: Phosphorus: 6.2 mg/dL — ABNORMAL HIGH (ref 2.5–4.6)

## 2020-05-19 LAB — MAGNESIUM: Magnesium: 2 mg/dL (ref 1.7–2.4)

## 2020-05-19 MED ORDER — SEVELAMER CARBONATE 800 MG PO TABS
800.0000 mg | ORAL_TABLET | Freq: Three times a day (TID) | ORAL | Status: DC
  Administered 2020-05-19 – 2020-07-02 (×109): 800 mg via ORAL
  Filled 2020-05-19 (×125): qty 1

## 2020-05-19 MED ORDER — BOOST / RESOURCE BREEZE PO LIQD CUSTOM
1.0000 | Freq: Three times a day (TID) | ORAL | Status: DC
  Administered 2020-05-20 – 2020-05-25 (×3): 1 via ORAL

## 2020-05-19 MED ORDER — VITAMIN B-12 100 MCG PO TABS
100.0000 ug | ORAL_TABLET | Freq: Every day | ORAL | Status: DC
  Administered 2020-05-19 – 2020-07-02 (×44): 100 ug via ORAL
  Filled 2020-05-19 (×45): qty 1

## 2020-05-19 NOTE — TOC Progression Note (Signed)
Transition of Care [facility]) - Progression Note    Patient Details  Name: Katie [name] MRN: [id number] Date of Birth: 12/23/1955  Transition of Care [facility]) CM/SW Contact  Eileen Stanford, LCSW Phone Number: 05/19/2020, 1:06 PM  Clinical Narrative:   Pt has no bed offers. [place] is no insurance. Pt was faxed out further to [facility] [place] facilities. However, it is going to be difficult to find outpatient HD chair and facility willing to accept pt--due to no insurance. [facility] HD Coordinator is working on outpatient chair placement.    Expected Discharge Plan: Skilled Nursing Facility Barriers to Discharge: Continued Medical Work up,Homeless with medical needs  Expected Discharge Plan and Services Expected Discharge Plan: [place] In-house Referral: Clinical Social Work Discharge Planning Services: Homebound not met per provider Post Acute Care Choice: [place] Living arrangements for the past 2 months: No permanent address (Lived with Sister and family)                 DME Arranged: N/A DME Agency: NA       HH Arranged: NA HH Agency: NA         Social Determinants of Health (SDOH) Interventions    Readmission Risk Interventions No flowsheet data found.

## 2020-05-19 NOTE — Progress Notes (Signed)
[facility] Progress Note  Patient: Katie [name]    EVO:[id number]  DOA: 04/30/2020     Date of Service: the patient was seen and examined on 05/19/2020  Chief Complaint  Patient presents with  . Weakness   Brief hospital course: Katie [name] is a 65 y.o. female with a history of diabetes mellitus type 2, stage IV CKD, heart failure, hypertension. Patient presented secondary to dyspnea with evidence of volume overload and pleural effusion.  1/3: PermCath placed in right chest 1/4: Patient endorsing significant pain around PermCath insertion site   Currently further plan is to continue hemodialysis as per nephrology, transfuse PRBC if hemoglobin drops below 7 Patient has no insurance, awaiting for placement  Assessment and Plan: Principal Problem:   Volume overload Active Problems:   Type 2 diabetes mellitus with stage 5 chronic kidney disease ([place])   Hypertension   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HCC)   Hyperkalemia   Pressure injury of skin   Pleural effusion   Acute heart failure with preserved ejection fraction (HFpEF) (HCC)   Chronic kidney disease (CKD), stage IV (severe) (HCC)   Pulmonary hypertension, unspecified (HCC)  Volume overload In setting of CKD. Cardiomegaly on chest x-ray.  Started on Lasix IV on admission but has now progressed to requiring hemodialysis. CKD likely progressed to ESRD Plan: Nephrology following for hemodialysis PermCath placed in right chest Plan for repeat hemodialysis tomorrow Nephrology looking for outpatient HD chair  Pleural effusion In setting of fluid overload with new diagnosis of grade 2 diastolic heart failure.  No previous history of pleural effusion.  Diagnostic right thoracentesis performed on 12/20.  No evidence of malignancy on cytology  Diabetes mellitus, type 2 Hemoglobin A1C of 4.7%.  No need for pharmacologic treatment. Diet control.  ESRD Unknown baseline.  Patient is unsure of her  primary nephrologist in [place].  Creatinine of 2.97 on admission.  Per patient, she was planned to start hemodialysis in addition to consideration for renal transplant.  Nephrology consulted on 12/20.  Worsening renal function in setting of attempted Lasix diuresis for heart failure. Nephrology now started hemodialysis. Plan: Last HD 05/16/2020 Next HD 05/18/2020 Nephrology looking for outpatient hemodialysis chair  Acute diastolic heart failure New diagnosis. Grade 2 dysfunction. Normal EF.  Associated RV systolic dysfunction with elevated PA pressure seen on TTE although RHC on 12/28 significant for mildly elevated right heart/pulmonary artery pressures.  Net -8.7 L over admission Plan: Continue Lasix 40 mg IV twice daily Daily weights Strict I's and O's  Moderate pericardial effusion No clinical evidence of tamponade.  No recent infections.  In setting of CKD V with elevated BUN. No chest pain. Plan: -Cardiology recommendation: outpatient Transthoracic Echocardiogram for surveillance  Mild/moderate aortic stenosis Unsure if this is symptomatic.  Patient does have dyspnea with exertion but this may be multifactorial. -Cardiology recommendations: outpatient Transthoracic Echocardiogram for surveillance  Primary hypertension Patient is on at least hydrochlorothiazide as an outpatient but is unsure of her regimen.  Improved control -Losartan, carvedilol, amlodipine -Lasix as above  Anemia Possible related to underlying kidney disease.  Evidence of macrocytosis on CBC.  TIBC low but otherwise normal iron panel. Vitamin B12 level 358, target > 400, started oral supplement and folate normal.  Hyperkalemia In setting of CKD. Lokelma 10 g given with improvement.  Pressure injury Mid coccyx, POA  History of tobacco use Patient has quit for at least 10 years. Previously, 20 pack year history.  Headache Normal chronic headache. Patient  states she takes Imitrex as  needed.   Obesity Body mass index is 30.91 kg/m.   Body mass index is 30.36 kg/m.  Nutrition Problem: Increased nutrient needs Etiology: chronic illness (CKD IV with temp HD, CHF) Interventions:    Pressure Injury 04/30/20 Coccyx Mid Stage 2 -  Partial thickness loss of dermis presenting as a shallow open injury with a red, pink wound bed without slough. (Active)  04/30/20 2130  Location: Coccyx  Location Orientation: Mid  Staging: Stage 2 -  Partial thickness loss of dermis presenting as a shallow open injury with a red, pink wound bed without slough.  Wound Description (Comments):   Present on Admission: Yes     Diet: Renal diet DVT Prophylaxis: Subcutaneous Heparin    Advance goals of care discussion: Full code  Family Communication: family was not present at bedside, at the time of interview.  The pt provided permission to discuss medical plan with the family. Opportunity was given to ask question and all questions were answered satisfactorily.   Disposition:  Pt is from home, admitted with renal failure, ESRD on hemodialysis, still requires hemodialysis, does not have insurance so awaiting for SNF placement and insurance application  Discharge to SNF vs friend's house after insurance application and hemodialysis set up done as an outpatient versus SNF placement.  Subjective: No significant acute overnight issues, pain is under control, denied any other active issues. Patient is having constipation did not move bowels for few days, RN was advised to give as needed laxatives.   Physical Exam: General:  alert oriented to time, place, and person.  Appear in no distress, affect appropriate Eyes: PERRLA ENT: Oral Mucosa Clear, moist  Neck: no JVD,  Cardiovascular: S1 and S2 Present, no Murmur,  Respiratory: good respiratory effort, Bilateral Air entry equal and Decreased, no Crackles, no wheezes Abdomen: Bowel Sound present, Soft and no tenderness,  Skin: No  rashes Extremities: no Pedal edema, no calf tenderness Neurologic: without any new focal findings Gait not checked due to patient safety concerns  Vitals:   05/18/20 2146 05/19/20 0345 05/19/20 0847 05/19/20 1207  BP: (!) 141/56 133/68 (!) 163/64 (!) 141/67  Pulse: 77 73 71 69  Resp: 18 18 18 19   Temp: 98.7 F (37.1 C) 98.4 F (36.9 C) 98 F (36.7 C) 98.2 F (36.8 C)  TempSrc: Oral Oral Oral Oral  SpO2: 100% 99% 100% 99%  Weight:  84.2 kg    Height:        Intake/Output Summary (Last 24 hours) at 05/19/2020 1603 Last data filed at 05/19/2020 1350 Gross per 24 hour  Intake 240 ml  Output 1400 ml  Net -1160 ml   Filed Weights   05/17/20 0015 05/18/20 0503 05/19/20 0345  Weight: 86.9 kg 85.3 kg 84.2 kg    Data Reviewed: I have personally reviewed and interpreted daily labs, tele strips, imagings as discussed above. I reviewed all nursing notes, pharmacy notes, vitals, pertinent old records I have discussed plan of care as described above with RN and patient/family.  CBC: Recent Labs  Lab 05/13/20 0649 05/17/20 0801 05/18/20 0417 05/19/20 0619  WBC 5.6 5.9 5.8 6.3  NEUTROABS  --  3.1 2.9  --   HGB 8.7* 7.8* 7.2* 7.6*  HCT 28.1* 24.8* 23.6* 24.9*  MCV 98.9 98.0 98.7 100.4*  PLT 203 215 217 161   Basic Metabolic Panel: Recent Labs  Lab 05/15/20 0546 05/16/20 0531 05/16/20 0532 05/17/20 0960 05/18/20 0417 05/19/20 0619  NA 139  139  --  140 140 141  K 4.3 4.3  --  4.4 4.5 4.6  CL 103 105  --  105 104 105  CO2 28 28  --  29 28 28   GLUCOSE 162* 72  --  91 91 95  BUN 43* 49*  --  43* 49* 52*  CREATININE 2.35* 2.72*  --  2.38* 2.80* 3.13*  CALCIUM 8.3* 8.0*  --  7.9* 8.0* 8.0*  MG 1.9  --  1.8 1.8 1.9 2.0  PHOS 4.2 4.8*  4.7*  --  5.1* 5.8* 6.2*    Studies: No results found.  Scheduled Meds: . amLODipine  5 mg Oral Daily  . benzonatate  200 mg Oral TID  . carvedilol  12.5 mg Oral BID WC  . Chlorhexidine Gluconate Cloth  6 each Topical Q0600  . feeding  supplement  1 Container Oral TID BM  . furosemide  40 mg Intravenous Q12H  . heparin  5,000 Units Subcutaneous Q8H  . insulin aspart  0-9 Units Subcutaneous TID WC  . lidocaine  1 patch Transdermal Q24H  . losartan  50 mg Oral Daily  . methocarbamol  500 mg Oral QID  . multivitamin  1 tablet Oral QHS  . polyethylene glycol  17 g Oral Daily  . senna-docusate  1 tablet Oral BID  . sevelamer carbonate  800 mg Oral TID WC  . sodium chloride flush  3 mL Intravenous Q12H  . topiramate  25 mg Oral BID  . vitamin B-12  100 mcg Oral Daily   Continuous Infusions: . sodium chloride     PRN Meds: sodium chloride, acetaminophen **OR** acetaminophen, ALPRAZolam, bisacodyl, chlorpheniramine-HYDROcodone, melatonin, ondansetron **OR** ondansetron (ZOFRAN) IV, oxyCODONE-acetaminophen, promethazine, sodium chloride flush  Time spent: 35 minutes  Author: Val Riles. MD [facility] 05/19/2020 4:03 PM  To reach On-call, see care teams to locate the attending and reach out to them via [email or web address]. If 7PM-7AM, please contact night-coverage If you still have difficulty reaching the attending provider, please page the [facility] (Director on Call) for [facility] on amion for assistance.

## 2020-05-19 NOTE — Progress Notes (Signed)
Nutrition Follow Up Note   DOCUMENTATION CODES:   Obesity unspecified  INTERVENTION:   Boost Breeze po TID, each supplement provides 250 kcal and 9 grams of protein  Rena-vit po daily  NUTRITION DIAGNOSIS:   Increased nutrient needs related to chronic illness (CKD IV with temp HD, CHF) as evidenced by estimated needs.  GOAL:   Patient will meet greater than or equal to 90% of their needs  -progressing   MONITOR:   PO intake,Supplement acceptance,Labs,Weight trends,Skin,I & O's  ASSESSMENT:   65 y/o female with h/o CKD IV, CHF, DM and [place] who is admitted with AKI and volume overload.  RD working remotely.  Pt continues to have good appetite and oral intake in hospital; pt eating 100% of meals. Pt does not like the Nepro supplements, RD will discontinue and order Boost Breeze as this is low in potassium and phosphorus. Per chart, pt is down 15lbs(7%) since admit; RD unsure of pt's UBW. Pt continues on HD. Pt currently awaiting placement.   Medications reviewed and include: lasix, heparin, insulin, rena-vit, miralax, senokot, B12  Labs reviewed: K 4.6 wnl, BUN 52(H), creat 3.13(H), P 6.2(H), Mg 2.0 wnl Hgb 7.6(L), Hct 24.9(L)  NUTRITION - FOCUSED PHYSICAL EXAM: Unable to perform at this time   Diet Order:   Diet Order            Diet renal with fluid restriction Fluid restriction: 1200 mL Fluid; Room service appropriate? Yes; Fluid consistency: Thin  Diet effective now                EDUCATION NEEDS:   Education needs have been addressed  Skin:  Skin Assessment: Reviewed RN Assessment (ecchymosis, Stage II coccyx)  Last BM:  1/6- type 5  Height:   Ht Readings from Last 1 Encounters:  05/16/20 5\' 6"  (1.676 m)    Weight:   Wt Readings from Last 1 Encounters:  05/19/20 84.2 kg    Ideal Body Weight:  59 kg  BMI:  Body mass index is 29.96 kg/m.  Estimated Nutritional Needs:   Kcal:  1900-2200kcal/day  Protein:  95-110g/day  Fluid:  UOP  +1L  [name] MS, RD, LDN Please refer to [facility] for RD and/or RD on-call/weekend/after hours pager

## 2020-05-19 NOTE — Progress Notes (Signed)
Mobility Specialist - Progress Note   05/19/20 1207  Mobility  Activity Transferred:  Bed to chair  Level of Assistance Minimal assist, patient does 75% or more  Assistive Device Front wheel walker  Distance Ambulated (ft) 5 ft  Mobility Response Tolerated well  Mobility performed by Mobility specialist  $Mobility charge 1 Mobility    Pre-mobility: 74 HR, 98% SpO2 Post-mobility: 71 HR, 97% SpO2   Pt laying in bed upon arrival. Pt states "i'm not feeling good because I haven't had a BM". Pt educated on the benefits of mobility and how mobility can assist w/ having a BM. Pt shows understanding and agreed to session. Pt able to get to EOB mod. Independently. Pt S2S to RW w/ min. Assist. Pt transferred from bed to chair w/ min. Assist. VC required during S2S and transfer for hand placement and sequencing. O2 sat in high 90s during session. Pt on 1.5L O2 . Overall, pt tolerated session well. NT entered the room at the end of session. Pt left sitting on recliner w/ NT present in room.     Marland Reine Mobility Specialist  05/19/20, 12:12 PM

## 2020-05-19 NOTE — Progress Notes (Signed)
[facility]  ROUNDING NOTE   Subjective:   Patient is starting to reaccumulate fluid.  Creatinine 3.13 (2.8) UOP 1400 - IV furosemide.   Objective:  Vital signs in last 24 hours:  Temp:  [98 F (36.7 C)-98.7 F (37.1 C)] 98.2 F (36.8 C) (01/06 1207) Pulse Rate:  [69-77] 69 (01/06 1207) Resp:  [17-19] 19 (01/06 1207) BP: (133-163)/(56-68) 141/67 (01/06 1207) SpO2:  [98 %-100 %] 99 % (01/06 1207) Weight:  [84.2 kg] 84.2 kg (01/06 0345)  Weight change: -1.122 kg Filed Weights   05/17/20 0015 05/18/20 0503 05/19/20 0345  Weight: 86.9 kg 85.3 kg 84.2 kg    Intake/Output: I/O last 3 completed shifts: In: 480 [P.O.:480] Out: 2100 [Urine:2100]   Intake/Output this shift:  No intake/output data recorded.  Physical Exam: General: NAD,   Head: Normocephalic, atraumatic. Moist oral mucosal membranes  Eyes: Anicteric, PERRL  Neck: Supple, trachea midline  Lungs:  Clear to auscultation  Heart: Regular rate and rhythm  Abdomen:  Soft, nontender,   Extremities:  + peripheral edema.  Neurologic: Nonfocal, moving all four extremities  Skin: No lesions  Access: RIJ permcath 05/16/20 Dr. Lucky Cowboy    Basic Metabolic Panel: Recent Labs  Lab 05/15/20 5732 05/16/20 0531 05/16/20 0532 05/17/20 2025 05/18/20 0417 05/19/20 0619  NA 139 139  --  140 140 141  K 4.3 4.3  --  4.4 4.5 4.6  CL 103 105  --  105 104 105  CO2 28 28  --  29 28 28   GLUCOSE 162* 72  --  91 91 95  BUN 43* 49*  --  43* 49* 52*  CREATININE 2.35* 2.72*  --  2.38* 2.80* 3.13*  CALCIUM 8.3* 8.0*  --  7.9* 8.0* 8.0*  MG 1.9  --  1.8 1.8 1.9 2.0  PHOS 4.2 4.8*  4.7*  --  5.1* 5.8* 6.2*    Liver Function Tests: Recent Labs  Lab 05/14/20 0505 05/15/20 0546 05/16/20 0531 05/17/20 0614 05/18/20 0417  ALBUMIN 2.2* 2.2* 2.0* 2.1* 2.1*   No results for input(s): LIPASE, AMYLASE in the last 168 hours. No results for input(s): AMMONIA in the last 168 hours.  CBC: Recent Labs  Lab 05/13/20 0649  05/17/20 0801 05/18/20 0417 05/19/20 0619  WBC 5.6 5.9 5.8 6.3  NEUTROABS  --  3.1 2.9  --   HGB 8.7* 7.8* 7.2* 7.6*  HCT 28.1* 24.8* 23.6* 24.9*  MCV 98.9 98.0 98.7 100.4*  PLT 203 215 217 227    Cardiac Enzymes: No results for input(s): CKTOTAL, CKMB, CKMBINDEX, TROPONINI in the last 168 hours.  BNP: Invalid input(s): POCBNP  CBG: Recent Labs  Lab 05/18/20 1228 05/18/20 1650 05/18/20 2143 05/19/20 0849 05/19/20 1203  GLUCAP 109* 116* 112* 86 131*    Microbiology: Results for orders placed or performed during the hospital encounter of 04/30/20  Resp Panel by RT-PCR (Flu A&B, Covid) Nasopharyngeal Swab     Status: None   Collection Time: 04/30/20  4:01 PM   Specimen: Nasopharyngeal Swab; Nasopharyngeal(NP) swabs in vial transport medium  Result Value Ref Range Status   SARS Coronavirus 2 by RT PCR NEGATIVE NEGATIVE Final    Comment: (NOTE) SARS-CoV-2 target nucleic acids are NOT DETECTED.  The SARS-CoV-2 RNA is generally detectable in upper respiratory specimens during the acute phase of infection. The lowest concentration of SARS-CoV-2 viral copies this assay can detect is 138 copies/mL. A negative result does not preclude SARS-Cov-2 infection and should not be used as  the sole basis for treatment or other patient management decisions. A negative result may occur with  improper specimen collection/handling, submission of specimen other than nasopharyngeal swab, presence of viral mutation(s) within the areas targeted by this assay, and inadequate number of viral copies(<138 copies/mL). A negative result must be combined with clinical observations, patient history, and epidemiological information. The expected result is Negative.  Fact Sheet for Patients:  EntrepreneurPulse.com.au  Fact Sheet for Healthcare Providers:  IncredibleEmployment.be  This test is no t yet approved or cleared by the [place] FDA and  has been  authorized for detection and/or diagnosis of SARS-CoV-2 by FDA under an Emergency Use Authorization (EUA). This EUA will remain  in effect (meaning this test can be used) for the duration of the COVID-19 declaration under Section 564(b)(1) of the Act, 21 U.S.C.section 360bbb-3(b)(1), unless the authorization is terminated  or revoked sooner.       Influenza A by PCR NEGATIVE NEGATIVE Final   Influenza B by PCR NEGATIVE NEGATIVE Final    Comment: (NOTE) The Xpert Xpress SARS-CoV-2/FLU/RSV plus assay is intended as an aid in the diagnosis of influenza from Nasopharyngeal swab specimens and should not be used as a sole basis for treatment. Nasal washings and aspirates are unacceptable for Xpert Xpress SARS-CoV-2/FLU/RSV testing.  Fact Sheet for Patients: EntrepreneurPulse.com.au  Fact Sheet for Healthcare Providers: IncredibleEmployment.be  This test is not yet approved or cleared by the [place] FDA and has been authorized for detection and/or diagnosis of SARS-CoV-2 by FDA under an Emergency Use Authorization (EUA). This EUA will remain in effect (meaning this test can be used) for the duration of the COVID-19 declaration under Section 564(b)(1) of the Act, 21 U.S.C. section 360bbb-3(b)(1), unless the authorization is terminated or revoked.  Performed at [facility], [address] [place], [place] [address]   Body fluid culture     Status: None   Collection Time: 05/02/20  4:44 PM   Specimen: PATH Cytology Pleural fluid  Result Value Ref Range Status   Specimen Description   Final    PLEURAL Performed at [facility], [address] [place], [place] [address]    Special Requests   Final    NONE Performed at [facility], [address] [place], [place] [address]    Gram Stain   Final    MODERATE WBC PRESENT, PREDOMINANTLY MONONUCLEAR NO ORGANISMS SEEN    Culture   Final    NO  GROWTH Performed at [facility], [place] [address] [place], [place] [address]    Report Status 05/06/2020 FINAL  Final  MRSA PCR Screening     Status: None   Collection Time: 05/16/20  9:29 PM   Specimen: Nasal Mucosa; Nasopharyngeal  Result Value Ref Range Status   MRSA by PCR NEGATIVE NEGATIVE Final    Comment:        The GeneXpert MRSA Assay (FDA approved for NASAL specimens only), is one component of a comprehensive MRSA colonization surveillance program. It is not intended to diagnose MRSA infection nor to guide or monitor treatment for MRSA infections. Performed at [facility], [address] [place],  [address]     Coagulation Studies: No results for input(s): LABPROT, INR in the last 72 hours.  Urinalysis: No results for input(s): COLORURINE, LABSPEC, PHURINE, GLUCOSEU, HGBUR, BILIRUBINUR, KETONESUR, PROTEINUR, UROBILINOGEN, NITRITE, LEUKOCYTESUR in the last 72 hours.  Invalid input(s): APPERANCEUR    Imaging: No results found.   Medications:   .  sodium chloride     . amLODipine  5 mg Oral Daily  . benzonatate  200 mg Oral TID  . carvedilol  12.5 mg Oral BID WC  . Chlorhexidine Gluconate Cloth  6 each Topical Q0600  . feeding supplement  1 Container Oral TID BM  . furosemide  40 mg Intravenous Q12H  . heparin  5,000 Units Subcutaneous Q8H  . insulin aspart  0-9 Units Subcutaneous TID WC  . lidocaine  1 patch Transdermal Q24H  . losartan  50 mg Oral Daily  . methocarbamol  500 mg Oral QID  . multivitamin  1 tablet Oral QHS  . polyethylene glycol  17 g Oral Daily  . senna-docusate  1 tablet Oral BID  . sevelamer carbonate  800 mg Oral TID WC  . sodium chloride flush  3 mL Intravenous Q12H  . topiramate  25 mg Oral BID  . vitamin B-12  100 mcg Oral Daily   sodium chloride, acetaminophen **OR** acetaminophen, ALPRAZolam, bisacodyl, chlorpheniramine-HYDROcodone, melatonin, ondansetron **OR** ondansetron (ZOFRAN) IV,  oxyCODONE-acetaminophen, promethazine, sodium chloride flush  Assessment/ Plan:  [name] is a 65 y.o. white female with diastolic congestive heart failure, hypertension, diabetes mellitus type II who is admitted to [facility] on 04/30/2020 for Chronic kidney disease (CKD), stage IV (severe) ([place]) [N18.4] Volume overload [E87.70]  Patient initiated on hemodialysis this admission.   1. Acute kidney injury on chronic kidney disease stage IV with hyperkalemia and proteinuria.  Holding dialysis. Nonoliguric urine output but with fluid accumulation and rising creatinine Will evaluate for dialysis need daily.   2. Hypertension: well controlled. Current regimen of losartan, furosemide, carvedilol, amlodipine  3. Anemia of chronic kidney disease: hemoglobin 7.6.  SPEP/UPEP negative. Initiate EPO if remains on dialysis.    LOS: [place] 1/6/20221:57 PM

## 2020-05-20 LAB — GLUCOSE, CAPILLARY
Glucose-Capillary: 124 mg/dL — ABNORMAL HIGH (ref 70–99)
Glucose-Capillary: 79 mg/dL (ref 70–99)
Glucose-Capillary: 88 mg/dL (ref 70–99)
Glucose-Capillary: 91 mg/dL (ref 70–99)

## 2020-05-20 LAB — CBC
HCT: 24.5 % — ABNORMAL LOW (ref 36.0–46.0)
Hemoglobin: 7.5 g/dL — ABNORMAL LOW (ref 12.0–15.0)
MCH: 30.2 pg (ref 26.0–34.0)
MCHC: 30.6 g/dL (ref 30.0–36.0)
MCV: 98.8 fL (ref 80.0–100.0)
Platelets: 236 10*3/uL (ref 150–400)
RBC: 2.48 MIL/uL — ABNORMAL LOW (ref 3.87–5.11)
RDW: 13.1 % (ref 11.5–15.5)
WBC: 7 10*3/uL (ref 4.0–10.5)
nRBC: 0 % (ref 0.0–0.2)

## 2020-05-20 LAB — BASIC METABOLIC PANEL
Anion gap: 12 (ref 5–15)
BUN: 59 mg/dL — ABNORMAL HIGH (ref 8–23)
CO2: 24 mmol/L (ref 22–32)
Calcium: 8.2 mg/dL — ABNORMAL LOW (ref 8.9–10.3)
Chloride: 107 mmol/L (ref 98–111)
Creatinine, Ser: 3.37 mg/dL — ABNORMAL HIGH (ref 0.44–1.00)
GFR, Estimated: 15 mL/min — ABNORMAL LOW (ref >60)
Glucose, Bld: 95 mg/dL (ref 70–99)
Potassium: 5 mmol/L (ref 3.5–5.1)
Sodium: 143 mmol/L (ref 135–145)

## 2020-05-20 LAB — MAGNESIUM: Magnesium: 2.1 mg/dL (ref 1.7–2.4)

## 2020-05-20 LAB — PHOSPHORUS: Phosphorus: 6.2 mg/dL — ABNORMAL HIGH (ref 2.5–4.6)

## 2020-05-20 MED ORDER — PNEUMOCOCCAL VAC POLYVALENT 25 MCG/0.5ML IJ INJ
0.5000 mL | INJECTION | INTRAMUSCULAR | Status: DC
  Filled 2020-05-20: qty 0.5

## 2020-05-20 NOTE — Progress Notes (Signed)
Physical Therapy Treatment Patient Details Name: Katie [name] MRN: [id number] DOB: 04-Jan-1956 Today's Date: 05/20/2020    History of Present Illness Patient is a 65 y.o. female with a history of diabetes mellitus type 2, stage IV CKD, heart failure, hypertension. Patient presented secondary to dyspnea with evidence of volume overload and pleural effusion. Diagnosed with moderate pericardial effusion, mild to moderate aortic insufficiency and aortic stenosis.  S/p R heart cath 12/28.  R temp fem cath placed 12/29; 1/3 R IJ permcath placed; 1/4 R temp fem cath removed.    PT Comments    Pt resting in bed upon PT arrival and reporting Purewick not working and bed soaked d/t this.  Unable to get in touch with NT.  Therapist assisted with cleaning pt up (changed pt's gown and entire linens on bed); pt then reporting improved comfort (NT and nurse notified end of session pt would like bath).  SBA with bed mobility; CGA to min assist with transfers; and min assist to ambulate 10 feet with RW (limited distance d/t lightheadedness--resolved with sitting rest; pt also noted with B LE's shaking while walking and pt reporting LE's felt weak).  O2 sats 92% or greater on room air during sessions activities.  Will continue to progress pt with strengthening and progressive functional mobility per pt tolerance.      Follow Up Recommendations  SNF     Equipment Recommendations  Rolling walker with 5" wheels;3in1 (PT)    Recommendations for Other Services OT consult     Precautions / Restrictions Precautions Precautions: Fall Precaution Comments: R IJ permcath Restrictions Weight Bearing Restrictions: No    Mobility  Bed Mobility Overal bed mobility: Needs Assistance Bed Mobility: Supine to Sit;Sit to Supine     Supine to sit: Supervision;HOB elevated Sit to supine: Supervision;HOB elevated   General bed mobility comments: increased effort and time to perform on own  Transfers Overall transfer  level: Needs assistance Equipment used: Rolling walker (2 wheeled) Transfers: Sit to/from [facility] Sit to Stand: Min assist Stand pivot transfers: Min guard;Min assist (stand step turn bed to recliner)       General transfer comment: x1 trial standing from bed and from recliner; vc's for UE/LE placement and overall technique  Ambulation/Gait Ambulation/Gait assistance: Min assist Gait Distance (Feet): 10 Feet Assistive device: Rolling walker (2 wheeled)   Gait velocity: decreased   General Gait Details: pt's B LE's shaking; decreased B LE step length/foot clearance/heelstrike   Stairs             Wheelchair Mobility    Modified Rankin (Stroke Patients Only)       Balance Overall balance assessment: Needs assistance Sitting-balance support: No upper extremity supported;Feet supported Sitting balance-Leahy Scale: Good Sitting balance - Comments: steady sitting reaching within BOS   Standing balance support: Bilateral upper extremity supported Standing balance-Leahy Scale: Fair Standing balance comment: steady static standing but requiring B UE support on RW to maintain balance                            Cognition Arousal/Alertness: Awake/alert Behavior During Therapy: WFL for tasks assessed/performed Overall Cognitive Status: Within Functional Limits for tasks assessed                                 General Comments: Pt appearing motivated to participate in therapy  Exercises General Exercises - Lower Extremity Ankle Circles/Pumps: AROM;Strengthening;Both;Seated (10 reps x2) Long Arc Quad: AROM;Strengthening;Both;Seated (10 reps x2) Hip Flexion/Marching: AROM;Strengthening;Both;Seated (10 reps x2)    General Comments   Nursing cleared pt for participation in physical therapy.  Pt agreeable to PT session.      Pertinent Vitals/Pain Pain Assessment: 0-10 Pain Score: 4  Pain Location: R IJ permcath  site Pain Descriptors / Indicators: Sore Pain Intervention(s): Limited activity within patient's tolerance;Monitored during session;Repositioned  HR stable and WFL throughout treatment session.    Home Living                      Prior Function            PT Goals (current goals can now be found in the care plan section) Acute Rehab PT Goals Patient Stated Goal: to go home PT Goal Formulation: With patient Time For Goal Achievement: 06/01/20 Potential to Achieve Goals: Fair Progress towards PT goals: Progressing toward goals    Frequency    Min 2X/week      PT Plan Current plan remains appropriate    Co-evaluation              AM-PAC PT "6 Clicks" Mobility   Outcome Measure  Help needed turning from your back to your side while in a flat bed without using bedrails?: A Little Help needed moving from lying on your back to sitting on the side of a flat bed without using bedrails?: A Little Help needed moving to and from a bed to a chair (including a wheelchair)?: A Little Help needed standing up from a chair using your arms (e.g., wheelchair or bedside chair)?: A Little Help needed to walk in hospital room?: A Little Help needed climbing 3-5 steps with a railing? : A Lot 6 Click Score: 17    End of Session Equipment Utilized During Treatment: Gait belt Activity Tolerance: Patient limited by fatigue Patient left: in bed;with call bell/phone within reach;with bed alarm set Nurse Communication: Mobility status;Precautions PT Visit Diagnosis: Unsteadiness on feet (R26.81);Muscle weakness (generalized) (M62.81);History of falling (Z91.81);Other abnormalities of gait and mobility (R26.89)     Time: 0930-1008 PT Time Calculation (min) (ACUTE ONLY): 38 min  Charges:  $Therapeutic Exercise: 8-22 mins $Therapeutic Activity: 23-37 mins                    Leitha Bleak, PT 05/20/20, 1:34 PM

## 2020-05-20 NOTE — Progress Notes (Signed)
Patient has been accepted at [facility], however we are still waiting on financial clearance. At this moment no chair time has been given.

## 2020-05-20 NOTE — Progress Notes (Signed)
[facility] Progress Note  Patient: Katie [name]    DDU:[id number]  DOA: 04/30/2020     Date of Service: the patient was seen and examined on 05/20/2020  Chief Complaint  Patient presents with  . Weakness   Brief hospital course: Katie [name] is a 65 y.o. female with a history of diabetes mellitus type 2, stage IV CKD, heart failure, hypertension. Patient presented secondary to dyspnea with evidence of volume overload and pleural effusion.  1/3: PermCath placed in right chest 1/4: Patient endorsing significant pain around PermCath insertion site   Currently further plan is to continue hemodialysis as per nephrology, transfuse PRBC if hemoglobin drops below 7 Patient has no insurance, awaiting for placement  Assessment and Plan: Principal Problem:   Volume overload Active Problems:   Type 2 diabetes mellitus with stage 5 chronic kidney disease ([place])   Hypertension   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HCC)   Hyperkalemia   Pressure injury of skin   Pleural effusion   Acute heart failure with preserved ejection fraction (HFpEF) (HCC)   Chronic kidney disease (CKD), stage IV (severe) (H[facility])   Pulmonary hypertension, unspecified (HCC)  Volume overload In setting of CKD. Cardiomegaly on chest x-ray.  Started on Lasix IV on admission but has now progressed to requiring hemodialysis. CKD likely progressed to ESRD Plan: Nephrology following for hemodialysis PermCath placed in right chest Plan for repeat hemodialysis tomorrow Nephrology looking for outpatient HD chair  Pleural effusion In setting of fluid overload with new diagnosis of grade 2 diastolic heart failure.  No previous history of pleural effusion.  Diagnostic right thoracentesis performed on 12/20.  No evidence of malignancy on cytology  Diabetes mellitus, type 2 Hemoglobin A1C of 4.7%.  No need for pharmacologic treatment. Diet control.  ESRD Unknown baseline.  Patient is unsure of her  primary nephrologist in [place].  Creatinine of 2.97 on admission.  Per patient, she was planned to start hemodialysis in addition to consideration for renal transplant.  Nephrology consulted on 12/20.  Worsening renal function in setting of attempted Lasix diuresis for heart failure. Nephrology now started hemodialysis. Plan: Last HD 05/16/2020 Next HD 05/18/2020 Nephrology looking for outpatient hemodialysis chair  Acute diastolic heart failure New diagnosis. Grade 2 dysfunction. Normal EF.  Associated RV systolic dysfunction with elevated PA pressure seen on TTE although RHC on 12/28 significant for mildly elevated right heart/pulmonary artery pressures.  Net -8.7 L over admission Plan: Continue Lasix 40 mg IV twice daily Daily weights Strict I's and O's  Moderate pericardial effusion No clinical evidence of tamponade.  No recent infections.  In setting of CKD V with elevated BUN. No chest pain. Plan: -Cardiology recommendation: outpatient Transthoracic Echocardiogram for surveillance  Mild/moderate aortic stenosis Unsure if this is symptomatic.  Patient does have dyspnea with exertion but this may be multifactorial. -Cardiology recommendations: outpatient Transthoracic Echocardiogram for surveillance  Primary hypertension Patient is on at least hydrochlorothiazide as an outpatient but is unsure of her regimen.  Improved control -Losartan, carvedilol, amlodipine -Lasix as above  Anemia Possible related to underlying kidney disease.  Evidence of macrocytosis on CBC.  TIBC low but otherwise normal iron panel. Vitamin B12 level 358, target > 400, started oral supplement and folate normal.  Hyperkalemia In setting of CKD. Lokelma 10 g given with improvement.  Pressure injury Mid coccyx, POA  History of tobacco use Patient has quit for at least 10 years. Previously, 20 pack year history.  Headache Normal chronic headache. Patient  states she takes Imitrex as  needed.   Obesity Body mass index is 30.91 kg/m.   Body mass index is 30.36 kg/m.  Nutrition Problem: Increased nutrient needs Etiology: chronic illness (CKD IV with temp HD, CHF) Interventions:    Pressure Injury 04/30/20 Coccyx Mid Stage 2 -  Partial thickness loss of dermis presenting as a shallow open injury with a red, pink wound bed without slough. (Active)  04/30/20 2130  Location: Coccyx  Location Orientation: Mid  Staging: Stage 2 -  Partial thickness loss of dermis presenting as a shallow open injury with a red, pink wound bed without slough.  Wound Description (Comments):   Present on Admission: Yes     Diet: Renal diet DVT Prophylaxis: Subcutaneous Heparin    Advance goals of care discussion: Full code  Family Communication: family was not present at bedside, at the time of interview.  The pt provided permission to discuss medical plan with the family. Opportunity was given to ask question and all questions were answered satisfactorily.   Disposition:  Pt is from home, admitted with renal failure, ESRD on hemodialysis, still requires hemodialysis, does not have insurance so awaiting for SNF placement and insurance application  Discharge to SNF vs friend's house after insurance application and hemodialysis set up done as an outpatient versus SNF placement. 1/7 accepted at [facility], awaiting for financial clearance  Subjective: No significant acute overnight issues, pain is under control, denied any other active issues. Constipation resolved, patient did not move bowels. Denied any other complaints.    Physical Exam: General:  alert oriented to time, place, and person.  Appear in no distress, affect appropriate Eyes: PERRLA ENT: Oral Mucosa Clear, moist  Neck: no JVD,  Cardiovascular: S1 and S2 Present, no Murmur,  Respiratory: good respiratory effort, Bilateral Air entry equal and Decreased, no Crackles, no wheezes Abdomen: Bowel Sound present,  Soft and no tenderness,  Skin: No rashes Extremities: no Pedal edema, no calf tenderness Neurologic: without any new focal findings Gait not checked due to patient safety concerns  Vitals:   05/19/20 2016 05/20/20 0532 05/20/20 0826 05/20/20 1115  BP: (!) 159/68 (!) 159/76 (!) 172/76 (!) 157/67  Pulse: 74 73 71 76  Resp: 17 17 18 17   Temp: 98.1 F (36.7 C) 98.2 F (36.8 C) 98.7 F (37.1 C) 98.4 F (36.9 C)  TempSrc: Oral Oral Oral Oral  SpO2: 99% 100% 100% 99%  Weight:  84.7 kg    Height:        Intake/Output Summary (Last 24 hours) at 05/20/2020 1340 Last data filed at 05/20/2020 1117 Gross per 24 hour  Intake 720 ml  Output 1700 ml  Net -980 ml   Filed Weights   05/18/20 0503 05/19/20 0345 05/20/20 0532  Weight: 85.3 kg 84.2 kg 84.7 kg    Data Reviewed: I have personally reviewed and interpreted daily labs, tele strips, imagings as discussed above. I reviewed all nursing notes, pharmacy notes, vitals, pertinent old records I have discussed plan of care as described above with RN and patient/family.  CBC: Recent Labs  Lab 05/17/20 0801 05/18/20 0417 05/19/20 0619 05/20/20 0727  WBC 5.9 5.8 6.3 7.0  NEUTROABS 3.1 2.9  --   --   HGB 7.8* 7.2* 7.6* 7.5*  HCT 24.8* 23.6* 24.9* 24.5*  MCV 98.0 98.7 100.4* 98.8  PLT 215 217 227 852   Basic Metabolic Panel: Recent Labs  Lab 05/16/20 0531 05/16/20 0532 05/17/20 7782 05/18/20 0417 05/19/20 0619 05/20/20  0610  NA 139  --  140 140 141 143  K 4.3  --  4.4 4.5 4.6 5.0  CL 105  --  105 104 105 107  CO2 28  --  29 28 28 24   GLUCOSE 72  --  91 91 95 95  BUN 49*  --  43* 49* 52* 59*  CREATININE 2.72*  --  2.38* 2.80* 3.13* 3.37*  CALCIUM 8.0*  --  7.9* 8.0* 8.0* 8.2*  MG  --  1.8 1.8 1.9 2.0 2.1  PHOS 4.8*  4.7*  --  5.1* 5.8* 6.2* 6.2*    Studies: No results found.  Scheduled Meds: . amLODipine  5 mg Oral Daily  . benzonatate  200 mg Oral TID  . carvedilol  12.5 mg Oral BID WC  . Chlorhexidine Gluconate  Cloth  6 each Topical Q0600  . feeding supplement  1 Container Oral TID BM  . furosemide  40 mg Intravenous Q12H  . heparin  5,000 Units Subcutaneous Q8H  . insulin aspart  0-9 Units Subcutaneous TID WC  . lidocaine  1 patch Transdermal Q24H  . losartan  50 mg Oral Daily  . methocarbamol  500 mg Oral QID  . multivitamin  1 tablet Oral QHS  . [START ON 05/21/2020] pneumococcal 23 valent vaccine  0.5 mL Intramuscular Tomorrow-1000  . polyethylene glycol  17 g Oral Daily  . senna-docusate  1 tablet Oral BID  . sevelamer carbonate  800 mg Oral TID WC  . sodium chloride flush  3 mL Intravenous Q12H  . topiramate  25 mg Oral BID  . vitamin B-12  100 mcg Oral Daily   Continuous Infusions: . sodium chloride     PRN Meds: sodium chloride, acetaminophen **OR** acetaminophen, ALPRAZolam, bisacodyl, chlorpheniramine-HYDROcodone, melatonin, ondansetron **OR** ondansetron (ZOFRAN) IV, oxyCODONE-acetaminophen, promethazine, sodium chloride flush  Time spent: 35 minutes  Author: Val Riles. MD [facility] 05/20/2020 1:40 PM  To reach On-call, see care teams to locate the attending and reach out to them via [email or web address]. If 7PM-7AM, please contact night-coverage If you still have difficulty reaching the attending provider, please page the [facility] (Director on Call) for [facility] on amion for assistance.

## 2020-05-20 NOTE — Progress Notes (Signed)
[facility]  ROUNDING NOTE   Subjective:   Patient feels weaker and more tired.  Creatinine 3.37 (3.13) (2.8) UOP 900  Objective:  Vital signs in last 24 hours:  Temp:  [97.9 F (36.6 C)-98.7 F (37.1 C)] 98.7 F (37.1 C) (01/07 0826) Pulse Rate:  [69-74] 71 (01/07 0826) Resp:  [17-19] 18 (01/07 0826) BP: (141-172)/(67-76) 172/76 (01/07 0826) SpO2:  [99 %-100 %] 100 % (01/07 0826) Weight:  [84.7 kg] 84.7 kg (01/07 0532)  Weight change: 0.486 kg Filed Weights   05/18/20 0503 05/19/20 0345 05/20/20 0532  Weight: 85.3 kg 84.2 kg 84.7 kg    Intake/Output: I/O last 3 completed shifts: In: 480 [P.O.:480] Out: 2300 [Urine:2300]   Intake/Output this shift:  Total I/O In: 240 [P.O.:240] Out: -   Physical Exam: General: NAD, laying in bed  Head: Normocephalic, atraumatic. Moist oral mucosal membranes  Eyes: Anicteric, PERRL  Neck: Supple, trachea midline  Lungs:  Clear to auscultation  Heart: Regular rate and rhythm  Abdomen:  Soft, nontender,   Extremities:  + peripheral edema.  Neurologic: Nonfocal, moving all four extremities  Skin: No lesions  Access: RIJ permcath 05/16/20 Dr. Lucky Cowboy    Basic Metabolic Panel: Recent Labs  Lab 05/16/20 0531 05/16/20 0532 05/17/20 2229 05/18/20 0417 05/19/20 0619 05/20/20 0610  NA 139  --  140 140 141 143  K 4.3  --  4.4 4.5 4.6 5.0  CL 105  --  105 104 105 107  CO2 28  --  29 28 28 24   GLUCOSE 72  --  91 91 95 95  BUN 49*  --  43* 49* 52* 59*  CREATININE 2.72*  --  2.38* 2.80* 3.13* 3.37*  CALCIUM 8.0*  --  7.9* 8.0* 8.0* 8.2*  MG  --  1.8 1.8 1.9 2.0 2.1  PHOS 4.8*  4.7*  --  5.1* 5.8* 6.2* 6.2*    Liver Function Tests: Recent Labs  Lab 05/14/20 0505 05/15/20 0546 05/16/20 0531 05/17/20 0614 05/18/20 0417  ALBUMIN 2.2* 2.2* 2.0* 2.1* 2.1*   No results for input(s): LIPASE, AMYLASE in the last 168 hours. No results for input(s): AMMONIA in the last 168 hours.  CBC: Recent Labs  Lab  05/17/20 0801 05/18/20 0417 05/19/20 0619 05/20/20 0727  WBC 5.9 5.8 6.3 7.0  NEUTROABS 3.1 2.9  --   --   HGB 7.8* 7.2* 7.6* 7.5*  HCT 24.8* 23.6* 24.9* 24.5*  MCV 98.0 98.7 100.4* 98.8  PLT 215 217 227 236    Cardiac Enzymes: No results for input(s): CKTOTAL, CKMB, CKMBINDEX, TROPONINI in the last 168 hours.  BNP: Invalid input(s): POCBNP  CBG: Recent Labs  Lab 05/19/20 1203 05/19/20 1642 05/19/20 2047 05/20/20 0824 05/20/20 1114  GLUCAP 131* 126* 122* 88 124*    Microbiology: Results for orders placed or performed during the hospital encounter of 04/30/20  Resp Panel by RT-PCR (Flu A&B, Covid) Nasopharyngeal Swab     Status: None   Collection Time: 04/30/20  4:01 PM   Specimen: Nasopharyngeal Swab; Nasopharyngeal(NP) swabs in vial transport medium  Result Value Ref Range Status   SARS Coronavirus 2 by RT PCR NEGATIVE NEGATIVE Final    Comment: (NOTE) SARS-CoV-2 target nucleic acids are NOT DETECTED.  The SARS-CoV-2 RNA is generally detectable in upper respiratory specimens during the acute phase of infection. The lowest concentration of SARS-CoV-2 viral copies this assay can detect is 138 copies/mL. A negative result does not preclude SARS-Cov-2 infection and should not  be used as the sole basis for treatment or other patient management decisions. A negative result may occur with  improper specimen collection/handling, submission of specimen other than nasopharyngeal swab, presence of viral mutation(s) within the areas targeted by this assay, and inadequate number of viral copies(<138 copies/mL). A negative result must be combined with clinical observations, patient history, and epidemiological information. The expected result is Negative.  Fact Sheet for Patients:  EntrepreneurPulse.com.au  Fact Sheet for Healthcare Providers:  IncredibleEmployment.be  This test is no t yet approved or cleared by the [place] FDA  and  has been authorized for detection and/or diagnosis of SARS-CoV-2 by FDA under an Emergency Use Authorization (EUA). This EUA will remain  in effect (meaning this test can be used) for the duration of the COVID-19 declaration under Section 564(b)(1) of the Act, 21 U.S.C.section 360bbb-3(b)(1), unless the authorization is terminated  or revoked sooner.       Influenza A by PCR NEGATIVE NEGATIVE Final   Influenza B by PCR NEGATIVE NEGATIVE Final    Comment: (NOTE) The Xpert Xpress SARS-CoV-2/FLU/RSV plus assay is intended as an aid in the diagnosis of influenza from Nasopharyngeal swab specimens and should not be used as a sole basis for treatment. Nasal washings and aspirates are unacceptable for Xpert Xpress SARS-CoV-2/FLU/RSV testing.  Fact Sheet for Patients: EntrepreneurPulse.com.au  Fact Sheet for Healthcare Providers: IncredibleEmployment.be  This test is not yet approved or cleared by the [place] FDA and has been authorized for detection and/or diagnosis of SARS-CoV-2 by FDA under an Emergency Use Authorization (EUA). This EUA will remain in effect (meaning this test can be used) for the duration of the COVID-19 declaration under Section 564(b)(1) of the Act, 21 U.S.C. section 360bbb-3(b)(1), unless the authorization is terminated or revoked.  Performed at [facility], [address] [place], [place] [address]   Body fluid culture     Status: None   Collection Time: 05/02/20  4:44 PM   Specimen: PATH Cytology Pleural fluid  Result Value Ref Range Status   Specimen Description   Final    PLEURAL Performed at [facility], [address], [place], [place] [address]    Special Requests   Final    NONE Performed at [facility], [address] [place], [place] [address]    Gram Stain   Final    MODERATE WBC PRESENT, PREDOMINANTLY MONONUCLEAR NO ORGANISMS SEEN    Culture   Final    NO  GROWTH Performed at [facility], [place] [address], [place], [place] [address]    Report Status 05/06/2020 FINAL  Final  MRSA PCR Screening     Status: None   Collection Time: 05/16/20  9:29 PM   Specimen: Nasal Mucosa; Nasopharyngeal  Result Value Ref Range Status   MRSA by PCR NEGATIVE NEGATIVE Final    Comment:        The GeneXpert MRSA Assay (FDA approved for NASAL specimens only), is one component of a comprehensive MRSA colonization surveillance program. It is not intended to diagnose MRSA infection nor to guide or monitor treatment for MRSA infections. Performed at [facility], [address] [place], [place] [address]     Coagulation Studies: No results for input(s): LABPROT, INR in the last 72 hours.  Urinalysis: No results for input(s): COLORURINE, LABSPEC, PHURINE, GLUCOSEU, HGBUR, BILIRUBINUR, KETONESUR, PROTEINUR, UROBILINOGEN, NITRITE, LEUKOCYTESUR in the last 72 hours.  Invalid input(s): APPERANCEUR    Imaging: No results found.  Medications:   . sodium chloride     . amLODipine  5 mg Oral Daily  . benzonatate  200 mg Oral TID  . carvedilol  12.5 mg Oral BID WC  . Chlorhexidine Gluconate Cloth  6 each Topical Q0600  . feeding supplement  1 Container Oral TID BM  . furosemide  40 mg Intravenous Q12H  . heparin  5,000 Units Subcutaneous Q8H  . insulin aspart  0-9 Units Subcutaneous TID WC  . lidocaine  1 patch Transdermal Q24H  . losartan  50 mg Oral Daily  . methocarbamol  500 mg Oral QID  . multivitamin  1 tablet Oral QHS  . [START ON 05/21/2020] pneumococcal 23 valent vaccine  0.5 mL Intramuscular Tomorrow-1000  . polyethylene glycol  17 g Oral Daily  . senna-docusate  1 tablet Oral BID  . sevelamer carbonate  800 mg Oral TID WC  . sodium chloride flush  3 mL Intravenous Q12H  . topiramate  25 mg Oral BID  . vitamin B-12  100 mcg Oral Daily   sodium chloride, acetaminophen **OR** acetaminophen, ALPRAZolam, bisacodyl,  chlorpheniramine-HYDROcodone, melatonin, ondansetron **OR** ondansetron (ZOFRAN) IV, oxyCODONE-acetaminophen, promethazine, sodium chloride flush  Assessment/ Plan:  Katie [name] is a 64 y.o. white female with diastolic congestive heart failure, hypertension, diabetes mellitus type II who is admitted to [facility] on 04/30/2020 for Chronic kidney disease (CKD), stage IV (severe) ([place]) [N18.4] Volume overload [E87.70]  Patient initiated on hemodialysis this admission.   1. End stage renal disease: patient does not seem to be recovering function at this time.  Dialysis for later today. Orders prepared.  Outpatient planning will be initiated today.   2. Hypertension: well controlled. Current regimen of losartan, furosemide, carvedilol, amlodipine  3. Anemia of chronic kidney disease: hemoglobin 7.5.  SPEP/UPEP negative. Initiate EPO with HD treatments.    LOS: [place] 1/7/202211:15 AM

## 2020-05-21 LAB — BASIC METABOLIC PANEL
Anion gap: 8 (ref 5–15)
BUN: 32 mg/dL — ABNORMAL HIGH (ref 8–23)
CO2: 28 mmol/L (ref 22–32)
Calcium: 8 mg/dL — ABNORMAL LOW (ref 8.9–10.3)
Chloride: 105 mmol/L (ref 98–111)
Creatinine, Ser: 1.92 mg/dL — ABNORMAL HIGH (ref 0.44–1.00)
GFR, Estimated: 29 mL/min — ABNORMAL LOW (ref >60)
Glucose, Bld: 108 mg/dL — ABNORMAL HIGH (ref 70–99)
Potassium: 3.9 mmol/L (ref 3.5–5.1)
Sodium: 141 mmol/L (ref 135–145)

## 2020-05-21 LAB — CBC
HCT: 25.6 % — ABNORMAL LOW (ref 36.0–46.0)
Hemoglobin: 8.2 g/dL — ABNORMAL LOW (ref 12.0–15.0)
MCH: 30.8 pg (ref 26.0–34.0)
MCHC: 32 g/dL (ref 30.0–36.0)
MCV: 96.2 fL (ref 80.0–100.0)
Platelets: 230 10*3/uL (ref 150–400)
RBC: 2.66 MIL/uL — ABNORMAL LOW (ref 3.87–5.11)
RDW: 13 % (ref 11.5–15.5)
WBC: 6.1 10*3/uL (ref 4.0–10.5)
nRBC: 0 % (ref 0.0–0.2)

## 2020-05-21 LAB — GLUCOSE, CAPILLARY
Glucose-Capillary: 105 mg/dL — ABNORMAL HIGH (ref 70–99)
Glucose-Capillary: 121 mg/dL — ABNORMAL HIGH (ref 70–99)
Glucose-Capillary: 156 mg/dL — ABNORMAL HIGH (ref 70–99)
Glucose-Capillary: 88 mg/dL (ref 70–99)
Glucose-Capillary: 96 mg/dL (ref 70–99)

## 2020-05-21 LAB — MAGNESIUM: Magnesium: 1.9 mg/dL (ref 1.7–2.4)

## 2020-05-21 LAB — PHOSPHORUS: Phosphorus: 4.2 mg/dL (ref 2.5–4.6)

## 2020-05-21 MED ORDER — MELATONIN 5 MG PO TABS
5.0000 mg | ORAL_TABLET | Freq: Every evening | ORAL | Status: DC
  Administered 2020-05-21 – 2020-07-01 (×42): 5 mg via ORAL
  Filled 2020-05-21 (×44): qty 1

## 2020-05-21 MED ORDER — BUTALBITAL-APAP-CAFFEINE 50-325-40 MG PO TABS
1.0000 | ORAL_TABLET | Freq: Once | ORAL | Status: AC
  Administered 2020-05-21: 1 via ORAL
  Filled 2020-05-21: qty 1

## 2020-05-21 MED ORDER — AMLODIPINE BESYLATE 10 MG PO TABS
10.0000 mg | ORAL_TABLET | Freq: Every day | ORAL | Status: DC
Start: 2020-05-21 — End: 2020-06-04
  Administered 2020-05-21 – 2020-06-04 (×14): 10 mg via ORAL
  Filled 2020-05-21 (×8): qty 1
  Filled 2020-05-21: qty 2
  Filled 2020-05-21: qty 1
  Filled 2020-05-21 (×4): qty 2
  Filled 2020-05-21: qty 1

## 2020-05-21 MED ORDER — TORSEMIDE 20 MG PO TABS
40.0000 mg | ORAL_TABLET | Freq: Every day | ORAL | Status: DC
  Administered 2020-05-21 – 2020-06-13 (×23): 40 mg via ORAL
  Filled 2020-05-21 (×23): qty 2

## 2020-05-21 NOTE — Progress Notes (Signed)
[facility] Progress Note  Patient: Katie [name]    NID:[id number]  DOA: 04/30/2020     Date of Service: the patient was seen and examined on 05/21/2020  Chief Complaint  Patient presents with  . Weakness   Brief hospital course: Katie [name] is a 65 y.o. female with a history of diabetes mellitus type 2, stage IV CKD, heart failure, hypertension. Patient presented secondary to dyspnea with evidence of volume overload and pleural effusion.  1/3: PermCath placed in right chest 1/4: Patient endorsing significant pain around PermCath insertion site   Currently further plan is to continue hemodialysis as per nephrology, transfuse PRBC if hemoglobin drops below 7 Patient has no insurance, awaiting for placement  Assessment and Plan: Principal Problem:   Volume overload Active Problems:   Type 2 diabetes mellitus with stage 5 chronic kidney disease ([place])   Hypertension   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HCC)   Hyperkalemia   Pressure injury of skin   Pleural effusion   Acute heart failure with preserved ejection fraction (HFpEF) (HCC)   Chronic kidney disease (CKD), stage IV (severe) (HCC)   Pulmonary hypertension, unspecified (HCC)  Volume overload In setting of CKD. Cardiomegaly on chest x-ray.  Started on Lasix IV on admission but has now progressed to requiring hemodialysis. CKD likely progressed to ESRD Plan: Nephrology following for hemodialysis PermCath placed in right chest Plan for repeat hemodialysis tomorrow Nephrology looking for outpatient HD chair  Pleural effusion In setting of fluid overload with new diagnosis of grade 2 diastolic heart failure.  No previous history of pleural effusion.  Diagnostic right thoracentesis performed on 12/20.  No evidence of malignancy on cytology  Diabetes mellitus, type 2 Hemoglobin A1C of 4.7%.  No need for pharmacologic treatment. Diet control.  ESRD Unknown baseline.  Patient is unsure of her  primary nephrologist in [place].  Creatinine of 2.97 on admission.  Per patient, she was planned to start hemodialysis in addition to consideration for renal transplant.  Nephrology consulted on 12/20.  Worsening renal function in setting of attempted Lasix diuresis for heart failure. Nephrology now started hemodialysis. Plan: Nephrology looking for outpatient hemodialysis chair   Acute diastolic heart failure New diagnosis. Grade 2 dysfunction. Normal EF.  Associated RV systolic dysfunction with elevated PA pressure seen on TTE although RHC on 12/28 significant for mildly elevated right heart/pulmonary artery pressures.  Net -8.7 L over admission Plan: Continue Lasix 40 mg IV twice daily Daily weights Strict I's and O's  Moderate pericardial effusion No clinical evidence of tamponade.  No recent infections.  In setting of CKD V with elevated BUN. No chest pain. Plan: -Cardiology recommendation: outpatient Transthoracic Echocardiogram for surveillance  Mild/moderate aortic stenosis Unsure if this is symptomatic.  Patient does have dyspnea with exertion but this may be multifactorial. -Cardiology recommendations: outpatient Transthoracic Echocardiogram for surveillance  Primary hypertension Patient is on at least hydrochlorothiazide as an outpatient but is unsure of her regimen.  Improved control -Losartan, carvedilol, amlodipine -Lasix as above  Anemia Possible related to underlying kidney disease.  Evidence of macrocytosis on CBC.  TIBC low but otherwise normal iron panel. Vitamin B12 level 358, target > 400, started oral supplement and folate normal.  Hyperkalemia In setting of CKD. Lokelma 10 g given with improvement.  Pressure injury Mid coccyx, POA  History of tobacco use Patient has quit for at least 10 years. Previously, 20 pack year history.  Headache Normal chronic headache. Patient states she takes Imitrex as  needed.  Continue Tylenol and  oxycodone as needed 1/8 we will try Fioricet 1 dose  Obesity Body mass index is 30.91 kg/m.   Body mass index is 30.36 kg/m.  Nutrition Problem: Increased nutrient needs Etiology: chronic illness (CKD IV with temp HD, CHF) Interventions:    Pressure Injury 04/30/20 Coccyx Mid Stage 2 -  Partial thickness loss of dermis presenting as a shallow open injury with a red, pink wound bed without slough. (Active)  04/30/20 2130  Location: Coccyx  Location Orientation: Mid  Staging: Stage 2 -  Partial thickness loss of dermis presenting as a shallow open injury with a red, pink wound bed without slough.  Wound Description (Comments):   Present on Admission: Yes     Diet: Renal diet DVT Prophylaxis: Subcutaneous Heparin    Advance goals of care discussion: Full code  Family Communication: family was not present at bedside, at the time of interview.  The pt provided permission to discuss medical plan with the family. Opportunity was given to ask question and all questions were answered satisfactorily.   Disposition:  Pt is from home, admitted with renal failure, ESRD on hemodialysis, still requires hemodialysis, does not have insurance so awaiting for SNF placement and insurance application  Discharge to SNF vs friend's house after insurance application and hemodialysis set up done as an outpatient versus SNF placement. 1/7 accepted at [facility], awaiting for financial clearance  Subjective: No significant acute overnight issues, pain is under control, denied any other active issues. Constipation resolved, patient did not move bowels. Denied any other complaints. RN reported that patient is still having headache but patient did not complain to me during my exam.   Physical Exam: General:  alert oriented to time, place, and person.  Appear in no distress, affect appropriate Eyes: PERRLA ENT: Oral Mucosa Clear, moist  Neck: no JVD,  Cardiovascular: S1 and S2 Present, no  Murmur,  Respiratory: good respiratory effort, Bilateral Air entry equal and Decreased, no Crackles, no wheezes Abdomen: Bowel Sound present, Soft and no tenderness,  Skin: No rashes Extremities: no Pedal edema, no calf tenderness Neurologic: without any new focal findings Gait not checked due to patient safety concerns  Vitals:   05/20/20 2344 05/21/20 0534 05/21/20 0825 05/21/20 1211  BP: (!) 179/59 (!) 158/64 (!) 150/71 (!) 102/54  Pulse: 77 72 72 66  Resp: 17 17 18 18   Temp: 98.5 F (36.9 C) 98.3 F (36.8 C) 98.6 F (37 C) 98.7 F (37.1 C)  TempSrc: Oral Oral Oral Oral  SpO2: 95% 95% 95% 96%  Weight:  84.6 kg    Height:        Intake/Output Summary (Last 24 hours) at 05/21/2020 1511 Last data filed at 05/21/2020 0500 Gross per 24 hour  Intake --  Output 3425 ml  Net -3425 ml   Filed Weights   05/19/20 0345 05/20/20 0532 05/21/20 0534  Weight: 84.2 kg 84.7 kg 84.6 kg    Data Reviewed: I have personally reviewed and interpreted daily labs, tele strips, imagings as discussed above. I reviewed all nursing notes, pharmacy notes, vitals, pertinent old records I have discussed plan of care as described above with RN and patient/family.  CBC: Recent Labs  Lab 05/17/20 0801 05/18/20 0417 05/19/20 0619 05/20/20 0727 05/21/20 0409  WBC 5.9 5.8 6.3 7.0 6.1  NEUTROABS 3.1 2.9  --   --   --   HGB 7.8* 7.2* 7.6* 7.5* 8.2*  HCT 24.8* 23.6* 24.9* 24.5* 25.6*  MCV 98.0 98.7 100.4* 98.8 96.2  PLT 215 217 227 236 449   Basic Metabolic Panel: Recent Labs  Lab 05/17/20 0614 05/18/20 0417 05/19/20 0619 05/20/20 0610 05/21/20 0409  NA 140 140 141 143 141  K 4.4 4.5 4.6 5.0 3.9  CL 105 104 105 107 105  CO2 29 28 28 24 28   GLUCOSE 91 91 95 95 108*  BUN 43* 49* 52* 59* 32*  CREATININE 2.38* 2.80* 3.13* 3.37* 1.92*  CALCIUM 7.9* 8.0* 8.0* 8.2* 8.0*  MG 1.8 1.9 2.0 2.1 1.9  PHOS 5.1* 5.8* 6.2* 6.2* 4.2    Studies: No results found.  Scheduled Meds: . amLODipine  10  mg Oral Daily  . benzonatate  200 mg Oral TID  . carvedilol  12.5 mg Oral BID WC  . Chlorhexidine Gluconate Cloth  6 each Topical Q0600  . feeding supplement  1 Container Oral TID BM  . furosemide  40 mg Intravenous Q12H  . heparin  5,000 Units Subcutaneous Q8H  . insulin aspart  0-9 Units Subcutaneous TID WC  . lidocaine  1 patch Transdermal Q24H  . losartan  50 mg Oral Daily  . melatonin  5 mg Oral QPM  . methocarbamol  500 mg Oral QID  . multivitamin  1 tablet Oral QHS  . pneumococcal 23 valent vaccine  0.5 mL Intramuscular Tomorrow-1000  . polyethylene glycol  17 g Oral Daily  . senna-docusate  1 tablet Oral BID  . sevelamer carbonate  800 mg Oral TID WC  . sodium chloride flush  3 mL Intravenous Q12H  . topiramate  25 mg Oral BID  . vitamin B-12  100 mcg Oral Daily   Continuous Infusions: . sodium chloride     PRN Meds: sodium chloride, acetaminophen **OR** acetaminophen, ALPRAZolam, bisacodyl, chlorpheniramine-HYDROcodone, ondansetron **OR** ondansetron (ZOFRAN) IV, oxyCODONE-acetaminophen, promethazine, sodium chloride flush  Time spent: 35 minutes  Author: Val Riles. MD [facility] 05/21/2020 3:11 PM  To reach On-call, see care teams to locate the attending and reach out to them via [email or web address]. If 7PM-7AM, please contact night-coverage If you still have difficulty reaching the attending provider, please page the [facility] (Director on Call) for [facility] on amion for assistance.

## 2020-05-21 NOTE — Progress Notes (Signed)
Mobility Specialist - Progress Note   05/21/20 1124  Mobility  Activity Sat and stood x 3 (20 marches-in-place, 3 R lateral steps towards HOB)  Level of Assistance Contact guard assist, steadying assist  Assistive Device Front wheel walker  Mobility Response Tolerated well  Mobility performed by Mobility specialist  $Mobility charge 1 Mobility    Pre-mobility: 67 HR, 94% SpO2 Post-mobility: 68 HR, 94% SpO2   Pt laying in bed upon arrival. Pt states she's "not feeling good" this AM, but is willing to participate in session. Pt c/o "easy 7/10" headache. Pt able to get to EOB SBA. Pt S2S x 3 CGA. After 3rd S2S rep, pt performed 20 marches-in-place. CGA required. Noted pt slightly unsteady w/ marching. Pt took 3 R lateral steps towards HOB CGA. Overall, pt tolerated session well. Pt declined transferring to recliner. States it hurts her bottom. Encouraged pt to sit up EOB during meals if she declines going to recliner. Pt shows understanding. Pt left laying in bed w/ all needs placed in reach. Nurse was notified.     Lisseth Brazeau Mobility Specialist  05/21/20, 11:31 AM

## 2020-05-21 NOTE — Progress Notes (Signed)
[place] visited pt. while rounding, as follow-up from earlier visits w/pt.  Pt. expressed frustration and physical discomfort re: being stuck in bed; she says she is experiencing severe headaches, which she experiences periodically every year but usually is able to relieve through distractions at home, playing w/her dog, etc.  Pt.'s physical discomfort and sense she is getting weaker by lying in bed, combined w/sense of rejection and alienation from family in [place] after not being invited to spend Christmas w/them, combine to create an existentially difficult situation for pt.  [place] provided listening support and helped pt. verbalize her concerns.  Pt. seemed encouraged by time talking w/CH.  CH lowered lights in rm. to alleviate pt.'s headache.  CH remains available as needed.

## 2020-05-21 NOTE — Progress Notes (Signed)
[facility]  ROUNDING NOTE   Subjective:   Patient states she has a severe headache today not resolved with Percocet Appetite is poor but able to eat some Denies any nausea or vomiting  Objective:  Vital signs in last 24 hours:  Temp:  [98.2 F (36.8 C)-98.7 F (37.1 C)] 98.7 F (37.1 C) (01/08 1211) Pulse Rate:  [66-77] 66 (01/08 1211) Resp:  [10-18] 18 (01/08 1211) BP: (102-180)/(54-77) 102/54 (01/08 1211) SpO2:  [94 %-98 %] 96 % (01/08 1211) Weight:  [84.6 kg] 84.6 kg (01/08 0534)  Weight change: -0.136 kg Filed Weights   05/19/20 0345 05/20/20 0532 05/21/20 0534  Weight: 84.2 kg 84.7 kg 84.6 kg    Intake/Output: I/O last 3 completed shifts: In: 480 [P.O.:480] Out: 6301 [Urine:3225; Other:1500]   Intake/Output this shift:  No intake/output data recorded.  Physical Exam: General:  No acute distress, laying in the bed  HEENT  anicteric, moist oral mucous membrane  Pulm/lungs  normal breathing effort, lungs are clear to auscultation  CVS/Heart  regular rhythm, no rub or gallop  Abdomen:   Soft, nontender  Extremities:  + Dependent peripheral edema  Neurologic:  Alert, oriented, able to follow commands  Skin:  No acute rashes   Access: RIJ permcath 05/16/20 Dr. Lucky Cowboy    Basic Metabolic Panel: Recent Labs  Lab 05/17/20 6010 05/18/20 0417 05/19/20 0619 05/20/20 0610 05/21/20 0409  NA 140 140 141 143 141  K 4.4 4.5 4.6 5.0 3.9  CL 105 104 105 107 105  CO2 29 28 28 24 28   GLUCOSE 91 91 95 95 108*  BUN 43* 49* 52* 59* 32*  CREATININE 2.38* 2.80* 3.13* 3.37* 1.92*  CALCIUM 7.9* 8.0* 8.0* 8.2* 8.0*  MG 1.8 1.9 2.0 2.1 1.9  PHOS 5.1* 5.8* 6.2* 6.2* 4.2    Liver Function Tests: Recent Labs  Lab 05/15/20 0546 05/16/20 0531 05/17/20 0614 05/18/20 0417  ALBUMIN 2.2* 2.0* 2.1* 2.1*   No results for input(s): LIPASE, AMYLASE in the last 168 hours. No results for input(s): AMMONIA in the last 168 hours.  CBC: Recent Labs  Lab 05/17/20 0801  05/18/20 0417 05/19/20 0619 05/20/20 0727 05/21/20 0409  WBC 5.9 5.8 6.3 7.0 6.1  NEUTROABS 3.1 2.9  --   --   --   HGB 7.8* 7.2* 7.6* 7.5* 8.2*  HCT 24.8* 23.6* 24.9* 24.5* 25.6*  MCV 98.0 98.7 100.4* 98.8 96.2  PLT 215 217 227 236 230    Cardiac Enzymes: No results for input(s): CKTOTAL, CKMB, CKMBINDEX, TROPONINI in the last 168 hours.  BNP: Invalid input(s): POCBNP  CBG: Recent Labs  Lab 05/20/20 1648 05/20/20 2345 05/21/20 0042 05/21/20 0823 05/21/20 1214  GLUCAP 91 79 105* 88 121*    Microbiology: Results for orders placed or performed during the hospital encounter of 04/30/20  Resp Panel by RT-PCR (Flu A&B, Covid) Nasopharyngeal Swab     Status: None   Collection Time: 04/30/20  4:01 PM   Specimen: Nasopharyngeal Swab; Nasopharyngeal(NP) swabs in vial transport medium  Result Value Ref Range Status   SARS Coronavirus 2 by RT PCR NEGATIVE NEGATIVE Final    Comment: (NOTE) SARS-CoV-2 target nucleic acids are NOT DETECTED.  The SARS-CoV-2 RNA is generally detectable in upper respiratory specimens during the acute phase of infection. The lowest concentration of SARS-CoV-2 viral copies this assay can detect is 138 copies/mL. A negative result does not preclude SARS-Cov-2 infection and should not be used as the sole basis for treatment or  other patient management decisions. A negative result may occur with  improper specimen collection/handling, submission of specimen other than nasopharyngeal swab, presence of viral mutation(s) within the areas targeted by this assay, and inadequate number of viral copies(<138 copies/mL). A negative result must be combined with clinical observations, patient history, and epidemiological information. The expected result is Negative.  Fact Sheet for Patients:  EntrepreneurPulse.com.au  Fact Sheet for Healthcare Providers:  IncredibleEmployment.be  This test is no t yet approved or cleared  by the [place] FDA and  has been authorized for detection and/or diagnosis of SARS-CoV-2 by FDA under an Emergency Use Authorization (EUA). This EUA will remain  in effect (meaning this test can be used) for the duration of the COVID-19 declaration under Section 564(b)(1) of the Act, 21 U.S.C.section 360bbb-3(b)(1), unless the authorization is terminated  or revoked sooner.       Influenza A by PCR NEGATIVE NEGATIVE Final   Influenza B by PCR NEGATIVE NEGATIVE Final    Comment: (NOTE) The Xpert Xpress SARS-CoV-2/FLU/RSV plus assay is intended as an aid in the diagnosis of influenza from Nasopharyngeal swab specimens and should not be used as a sole basis for treatment. Nasal washings and aspirates are unacceptable for Xpert Xpress SARS-CoV-2/FLU/RSV testing.  Fact Sheet for Patients: EntrepreneurPulse.com.au  Fact Sheet for Healthcare Providers: IncredibleEmployment.be  This test is not yet approved or cleared by the [place] FDA and has been authorized for detection and/or diagnosis of SARS-CoV-2 by FDA under an Emergency Use Authorization (EUA). This EUA will remain in effect (meaning this test can be used) for the duration of the COVID-19 declaration under Section 564(b)(1) of the Act, 21 U.S.C. section 360bbb-3(b)(1), unless the authorization is terminated or revoked.  Performed at [facility], [address] [place], [place] [address]   Body fluid culture     Status: None   Collection Time: 05/02/20  4:44 PM   Specimen: PATH Cytology Pleural fluid  Result Value Ref Range Status   Specimen Description   Final    PLEURAL Performed at [facility], [address], [place], [place] [address]    Special Requests   Final    NONE Performed at [facility], [address] [place], [place] [address]    Gram Stain   Final    MODERATE WBC PRESENT, PREDOMINANTLY MONONUCLEAR NO ORGANISMS SEEN     Culture   Final    NO GROWTH Performed at [facility], [place] [address], [place], [place] [address]    Report Status 05/06/2020 FINAL  Final  MRSA PCR Screening     Status: None   Collection Time: 05/16/20  9:29 PM   Specimen: Nasal Mucosa; Nasopharyngeal  Result Value Ref Range Status   MRSA by PCR NEGATIVE NEGATIVE Final    Comment:        The GeneXpert MRSA Assay (FDA approved for NASAL specimens only), is one component of a comprehensive MRSA colonization surveillance program. It is not intended to diagnose MRSA infection nor to guide or monitor treatment for MRSA infections. Performed at [facility], [address] [place], [place] [address]     Coagulation Studies: No results for input(s): LABPROT, INR in the last 72 hours.  Urinalysis: No results for input(s): COLORURINE, LABSPEC, PHURINE, GLUCOSEU, HGBUR, BILIRUBINUR, KETONESUR, PROTEINUR, UROBILINOGEN, NITRITE, LEUKOCYTESUR in the last 72 hours.  Invalid input(s): APPERANCEUR    Imaging: No results found.   Medications:   . sodium chloride     .  amLODipine  10 mg Oral Daily  . benzonatate  200 mg Oral TID  . carvedilol  12.5 mg Oral BID WC  . Chlorhexidine Gluconate Cloth  6 each Topical Q0600  . feeding supplement  1 Container Oral TID BM  . furosemide  40 mg Intravenous Q12H  . heparin  5,000 Units Subcutaneous Q8H  . insulin aspart  0-9 Units Subcutaneous TID WC  . lidocaine  1 patch Transdermal Q24H  . losartan  50 mg Oral Daily  . methocarbamol  500 mg Oral QID  . multivitamin  1 tablet Oral QHS  . pneumococcal 23 valent vaccine  0.5 mL Intramuscular Tomorrow-1000  . polyethylene glycol  17 g Oral Daily  . senna-docusate  1 tablet Oral BID  . sevelamer carbonate  800 mg Oral TID WC  . sodium chloride flush  3 mL Intravenous Q12H  . topiramate  25 mg Oral BID  . vitamin B-12  100 mcg Oral Daily   sodium chloride, acetaminophen **OR** acetaminophen, ALPRAZolam, bisacodyl,  chlorpheniramine-HYDROcodone, melatonin, ondansetron **OR** ondansetron (ZOFRAN) IV, oxyCODONE-acetaminophen, promethazine, sodium chloride flush  Assessment/ Plan:  [name] is a 65 y.o. white female with diastolic congestive heart failure, hypertension, diabetes mellitus type II who is admitted to [facility] on 04/30/2020 for Chronic kidney disease (CKD), stage IV (severe) ([place]) [N18.4] Volume overload [E87.70]  Patient initiated on hemodialysis this admission.   1. End stage renal disease: Secondary to hypertension and atherosclerosis.  History of nonsteroidal use Outpatient dialysis placement in progress  2. Hypertension with lower extremity edema: Variable control Change IV Lasix to oral torsemide Currently managed with amlodipine, carvedilol, losartan  3. Anemia of chronic kidney disease:  Lab Results  Component Value Date   HGB 8.2 (L) 05/21/2020    SPEP/UPEP negative. EPO with HD treatments.        LOS: [place] 1/8/20221:36 PM

## 2020-05-22 LAB — BASIC METABOLIC PANEL
Anion gap: 6 (ref 5–15)
BUN: 43 mg/dL — ABNORMAL HIGH (ref 8–23)
CO2: 29 mmol/L (ref 22–32)
Calcium: 8 mg/dL — ABNORMAL LOW (ref 8.9–10.3)
Chloride: 105 mmol/L (ref 98–111)
Creatinine, Ser: 2.68 mg/dL — ABNORMAL HIGH (ref 0.44–1.00)
GFR, Estimated: 20 mL/min — ABNORMAL LOW (ref >60)
Glucose, Bld: 112 mg/dL — ABNORMAL HIGH (ref 70–99)
Potassium: 4 mmol/L (ref 3.5–5.1)
Sodium: 140 mmol/L (ref 135–145)

## 2020-05-22 LAB — GLUCOSE, CAPILLARY
Glucose-Capillary: 99 mg/dL (ref 70–99)
Glucose-Capillary: 135 mg/dL — ABNORMAL HIGH (ref 70–99)
Glucose-Capillary: 180 mg/dL — ABNORMAL HIGH (ref 70–99)
Glucose-Capillary: 175 mg/dL — ABNORMAL HIGH (ref 70–99)

## 2020-05-22 LAB — CBC
HCT: 24.5 % — ABNORMAL LOW (ref 36.0–46.0)
Hemoglobin: 7.8 g/dL — ABNORMAL LOW (ref 12.0–15.0)
MCH: 30.8 pg (ref 26.0–34.0)
MCHC: 31.8 g/dL (ref 30.0–36.0)
MCV: 96.8 fL (ref 80.0–100.0)
Platelets: 233 10*3/uL (ref 150–400)
RBC: 2.53 MIL/uL — ABNORMAL LOW (ref 3.87–5.11)
RDW: 13.1 % (ref 11.5–15.5)
WBC: 5.4 10*3/uL (ref 4.0–10.5)
nRBC: 0 % (ref 0.0–0.2)

## 2020-05-22 MED ORDER — METHYLPREDNISOLONE SODIUM SUCC 125 MG IJ SOLR
60.0000 mg | Freq: Once | INTRAMUSCULAR | Status: AC
  Administered 2020-05-22: 60 mg via INTRAVENOUS
  Filled 2020-05-22: qty 2

## 2020-05-22 MED ORDER — DIPHENHYDRAMINE HCL 50 MG/ML IJ SOLN
25.0000 mg | Freq: Once | INTRAMUSCULAR | Status: AC
  Administered 2020-05-22: 25 mg via INTRAVENOUS
  Filled 2020-05-22: qty 1

## 2020-05-22 MED ORDER — PROMETHAZINE HCL 25 MG/ML IJ SOLN: 25.0000 mg | Freq: Four times a day (QID) | INTRAMUSCULAR | Status: DC | PRN

## 2020-05-22 MED ORDER — TRAZODONE HCL 50 MG PO TABS
50.0000 mg | ORAL_TABLET | Freq: Every evening | ORAL | Status: DC
  Administered 2020-05-22 – 2020-07-01 (×41): 50 mg via ORAL
  Filled 2020-05-22 (×41): qty 1

## 2020-05-22 NOTE — Progress Notes (Signed)
[facility] Progress Note  Patient: Katie [name]    VEL:[id number]  DOA: 04/30/2020     Date of Service: the patient was seen and examined on 05/22/2020  Chief Complaint  Patient presents with  . Weakness   Brief hospital course: Katie [name] is a 65 y.o. female with a history of diabetes mellitus type 2, stage IV CKD, heart failure, hypertension. Patient presented secondary to dyspnea with evidence of volume overload and pleural effusion.  1/3: PermCath placed in right chest 1/4: Patient endorsing significant pain around PermCath insertion site   Currently further plan is to continue hemodialysis as per nephrology, transfuse PRBC if hemoglobin drops below 7 Patient has no insurance, awaiting for placement  Assessment and Plan: Principal Problem:   Volume overload Active Problems:   Type 2 diabetes mellitus with stage 5 chronic kidney disease ([place])   Hypertension   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HCC)   Hyperkalemia   Pressure injury of skin   Pleural effusion   Acute heart failure with preserved ejection fraction (HFpEF) (HCC)   Chronic kidney disease (CKD), stage IV (severe) (HCC)   Pulmonary hypertension, unspecified (HCC)  Volume overload In setting of CKD. Cardiomegaly on chest x-ray.  Started on Lasix IV on admission but has now progressed to requiring hemodialysis. CKD likely progressed to ESRD Plan: Nephrology following for hemodialysis PermCath placed in right chest Plan for repeat hemodialysis tomorrow Nephrology looking for outpatient HD chair  Pleural effusion In setting of fluid overload with new diagnosis of grade 2 diastolic heart failure.  No previous history of pleural effusion.  Diagnostic right thoracentesis performed on 12/20.  No evidence of malignancy on cytology  Diabetes mellitus, type 2 Hemoglobin A1C of 4.7%.  No need for pharmacologic treatment. Diet control.  ESRD Unknown baseline.  Patient is unsure of her  primary nephrologist in [place].  Creatinine of 2.97 on admission.  Per patient, she was planned to start hemodialysis in addition to consideration for renal transplant.  Nephrology consulted on 12/20.  Worsening renal function in setting of attempted Lasix diuresis for heart failure. Nephrology now started hemodialysis. Plan: Nephrology looking for outpatient hemodialysis chair   Acute diastolic heart failure New diagnosis. Grade 2 dysfunction. Normal EF.  Associated RV systolic dysfunction with elevated PA pressure seen on TTE although RHC on 12/28 significant for mildly elevated right heart/pulmonary artery pressures.  Net -8.7 L over admission Plan: Continue Lasix 40 mg IV twice daily Daily weights Strict I's and O's  Moderate pericardial effusion No clinical evidence of tamponade.  No recent infections.  In setting of CKD V with elevated BUN. No chest pain. Plan: -Cardiology recommendation: outpatient Transthoracic Echocardiogram for surveillance  Mild/moderate aortic stenosis Unsure if this is symptomatic.  Patient does have dyspnea with exertion but this may be multifactorial. -Cardiology recommendations: outpatient Transthoracic Echocardiogram for surveillance  Primary hypertension Patient is on at least hydrochlorothiazide as an outpatient but is unsure of her regimen.  Improved control -Losartan, carvedilol, amlodipine -Lasix as above  Anemia Possible related to underlying kidney disease.  Evidence of macrocytosis on CBC.  TIBC low but otherwise normal iron panel. Vitamin B12 level 358, target > 400, started oral supplement and folate normal.  Hyperkalemia In setting of CKD. Lokelma 10 g given with improvement.  Pressure injury Mid coccyx, POA  History of tobacco use Patient has quit for at least 10 years. Previously, 20 pack year history.  Headache Normal chronic headache. Patient states she takes Imitrex as  needed.  Continue Tylenol and  oxycodone as needed 1/8 s/p  Fioricet 1 dose 1/9 IV Solu-Medrol 60 mg x 1 dose, Benadryl 25 mg IV x1 dose and Phenergan 25 mg IV x1 dose given    Insomnia, started trazodone and melatonin   Obesity Body mass index is 30.91 kg/m.   Body mass index is 30.36 kg/m.  Nutrition Problem: Increased nutrient needs Etiology: chronic illness (CKD IV with temp HD, CHF) Interventions:    Pressure Injury 04/30/20 Coccyx Mid Stage 2 -  Partial thickness loss of dermis presenting as a shallow open injury with a red, pink wound bed without slough. (Active)  04/30/20 2130  Location: Coccyx  Location Orientation: Mid  Staging: Stage 2 -  Partial thickness loss of dermis presenting as a shallow open injury with a red, pink wound bed without slough.  Wound Description (Comments):   Present on Admission: Yes     Diet: Renal diet DVT Prophylaxis: Subcutaneous Heparin    Advance goals of care discussion: Full code  Family Communication: family was not present at bedside, at the time of interview.  The pt provided permission to discuss medical plan with the family. Opportunity was given to ask question and all questions were answered satisfactorily.   Disposition:  Pt is from home, admitted with renal failure, ESRD on hemodialysis, still requires hemodialysis, does not have insurance so awaiting for SNF placement and insurance application  Discharge to SNF vs friend's house after insurance application and hemodialysis set up done as an outpatient versus SNF placement. 1/7 accepted at [facility], awaiting for financial clearance  Subjective: No significant acute overnight issues, patient was complaining of insomnia and persistent headache all over the head and face and jaw.  Patient has history of migraine headache.  Patient does not feel improvement after Percocet and 1 dose of Fioricet given yesterday. Patient denies any other active issues.  She was advised that we will give different  medications today for headache.   Physical Exam: General:  alert oriented to time, place, and person.  Appear in no distress, affect appropriate Eyes: PERRLA ENT: Oral Mucosa Clear, moist  Neck: no JVD,  Cardiovascular: S1 and S2 Present, no Murmur,  Respiratory: good respiratory effort, Bilateral Air entry equal and Decreased, no Crackles, no wheezes Abdomen: Bowel Sound present, Soft and no tenderness,  Skin: No rashes Extremities: no Pedal edema, no calf tenderness Neurologic: without any new focal findings Gait not checked due to patient safety concerns  Vitals:   05/22/20 0359 05/22/20 0604 05/22/20 0751 05/22/20 1318  BP: (!) 147/60  (!) 150/66 (!) 158/72  Pulse: 71  71 75  Resp: 17  17 18   Temp: 98.6 F (37 C)  98.1 F (36.7 C) 98.7 F (37.1 C)  TempSrc: Oral  Oral Oral  SpO2: 95%  96% 94%  Weight:  84.4 kg    Height:        Intake/Output Summary (Last 24 hours) at 05/22/2020 1409 Last data filed at 05/22/2020 1014 Gross per 24 hour  Intake 543 ml  Output 1150 ml  Net -607 ml   Filed Weights   05/20/20 0532 05/21/20 0534 05/22/20 0604  Weight: 84.7 kg 84.6 kg 84.4 kg    Data Reviewed: I have personally reviewed and interpreted daily labs, tele strips, imagings as discussed above. I reviewed all nursing notes, pharmacy notes, vitals, pertinent old records I have discussed plan of care as described above with RN and patient/family.  CBC: Recent Labs  Lab 05/17/20 0801 05/18/20 0417 05/19/20 0619 05/20/20 0727 05/21/20 0409 05/22/20 0553  WBC 5.9 5.8 6.3 7.0 6.1 5.4  NEUTROABS 3.1 2.9  --   --   --   --   HGB 7.8* 7.2* 7.6* 7.5* 8.2* 7.8*  HCT 24.8* 23.6* 24.9* 24.5* 25.6* 24.5*  MCV 98.0 98.7 100.4* 98.8 96.2 96.8  PLT 215 217 227 236 230 947   Basic Metabolic Panel: Recent Labs  Lab 05/17/20 0614 05/18/20 0417 05/19/20 0619 05/20/20 0610 05/21/20 0409 05/22/20 0553  NA 140 140 141 143 141 140  K 4.4 4.5 4.6 5.0 3.9 4.0  CL 105 104 105 107  105 105  CO2 29 28 28 24 28 29   GLUCOSE 91 91 95 95 108* 112*  BUN 43* 49* 52* 59* 32* 43*  CREATININE 2.38* 2.80* 3.13* 3.37* 1.92* 2.68*  CALCIUM 7.9* 8.0* 8.0* 8.2* 8.0* 8.0*  MG 1.8 1.9 2.0 2.1 1.9  --   PHOS 5.1* 5.8* 6.2* 6.2* 4.2  --     Studies: No results found.  Scheduled Meds: . amLODipine  10 mg Oral Daily  . benzonatate  200 mg Oral TID  . carvedilol  12.5 mg Oral BID WC  . Chlorhexidine Gluconate Cloth  6 each Topical Q0600  . feeding supplement  1 Container Oral TID BM  . heparin  5,000 Units Subcutaneous Q8H  . insulin aspart  0-9 Units Subcutaneous TID WC  . lidocaine  1 patch Transdermal Q24H  . losartan  50 mg Oral Daily  . melatonin  5 mg Oral QPM  . methocarbamol  500 mg Oral QID  . multivitamin  1 tablet Oral QHS  . pneumococcal 23 valent vaccine  0.5 mL Intramuscular Tomorrow-1000  . polyethylene glycol  17 g Oral Daily  . senna-docusate  1 tablet Oral BID  . sevelamer carbonate  800 mg Oral TID WC  . sodium chloride flush  3 mL Intravenous Q12H  . topiramate  25 mg Oral BID  . torsemide  40 mg Oral Daily  . vitamin B-12  100 mcg Oral Daily   Continuous Infusions: . sodium chloride     PRN Meds: sodium chloride, acetaminophen **OR** acetaminophen, ALPRAZolam, bisacodyl, chlorpheniramine-HYDROcodone, ondansetron **OR** ondansetron (ZOFRAN) IV, oxyCODONE-acetaminophen, promethazine, promethazine, sodium chloride flush  Time spent: 35 minutes  Author: Val Riles. MD [facility] 05/22/2020 2:09 PM  To reach On-call, see care teams to locate the attending and reach out to them via [email or web address]. If 7PM-7AM, please contact night-coverage If you still have difficulty reaching the attending provider, please page the [facility] (Director on Call) for [facility] on amion for assistance.

## 2020-05-22 NOTE — Progress Notes (Signed)
Mobility Specialist - Progress Note   05/22/20 1100  Mobility  Activity Ambulated in room  Range of Motion/Exercises Right leg;Left leg (seated march)  Level of Assistance Minimal assist, patient does 75% or more  Assistive Device Front wheel walker  Distance Ambulated (ft) 40 ft  Mobility Response Tolerated well  Mobility performed by Mobility specialist  $Mobility charge 1 Mobility    Pre-mobility: 75 HR, 97% SpO2 During mobility: 78 HR, 94% SpO2   Pt was lying in bed upon arrival utilizing room air. Pt agreed to session, but needed some encouragement. Pt c/o pain, nausea, and fatigue this date, but was not limited for mobility. Pt indicating pain is located in head/neck/back areas "8/10". Orthostatic vitals were measured prior to activity: Supine: 168/74 (99) EOB: 151/72 (95) Standing: 125/57 (78) Pt was able to get EOB independently, denying dizziness upon sitting. Pt attempted to stand to RW, unsuccessful on first 2 attempts. However, pt progressed to minA on 3rd trial using momentum from elevated bed height. Pt c/o mild dizziness upon standing. Pt proceeded to ambulate 40' in room with no LOB noted. Pt c/o weakness during activity. Upon return to EOB, pt continued with seated march. Pt expressed her joy to see her progression with activity, as she stated she was only able to get as far as the foot of the bed during last session. Pt highly motivated at the end of session and eager to ambulate again with mobility tech the next available date. Overall, pt tolerated session well. Pt was left in bed with all needs in reach and alarm set. Nurse notified.  Kathee Delton Mobility Specialist 05/22/20, 11:28 AM

## 2020-05-23 LAB — CBC
HCT: 24.6 % — ABNORMAL LOW (ref 36.0–46.0)
Hemoglobin: 8 g/dL — ABNORMAL LOW (ref 12.0–15.0)
MCH: 31 pg (ref 26.0–34.0)
MCHC: 32.5 g/dL (ref 30.0–36.0)
MCV: 95.3 fL (ref 80.0–100.0)
Platelets: 232 10*3/uL (ref 150–400)
RBC: 2.58 MIL/uL — ABNORMAL LOW (ref 3.87–5.11)
RDW: 13 % (ref 11.5–15.5)
WBC: 6.6 10*3/uL (ref 4.0–10.5)
nRBC: 0 % (ref 0.0–0.2)

## 2020-05-23 LAB — VITAMIN B12: Vitamin B-12: 474 pg/mL (ref 180–914)

## 2020-05-23 LAB — BASIC METABOLIC PANEL
Anion gap: 9 (ref 5–15)
BUN: 52 mg/dL — ABNORMAL HIGH (ref 8–23)
CO2: 26 mmol/L (ref 22–32)
Calcium: 8.3 mg/dL — ABNORMAL LOW (ref 8.9–10.3)
Chloride: 104 mmol/L (ref 98–111)
Creatinine, Ser: 2.92 mg/dL — ABNORMAL HIGH (ref 0.44–1.00)
GFR, Estimated: 18 mL/min — ABNORMAL LOW (ref >60)
Glucose, Bld: 99 mg/dL (ref 70–99)
Potassium: 4.2 mmol/L (ref 3.5–5.1)
Sodium: 139 mmol/L (ref 135–145)

## 2020-05-23 LAB — GLUCOSE, CAPILLARY
Glucose-Capillary: 79 mg/dL (ref 70–99)
Glucose-Capillary: 114 mg/dL — ABNORMAL HIGH (ref 70–99)
Glucose-Capillary: 119 mg/dL — ABNORMAL HIGH (ref 70–99)
Glucose-Capillary: 161 mg/dL — ABNORMAL HIGH (ref 70–99)

## 2020-05-23 LAB — VITAMIN D 25 HYDROXY (VIT D DEFICIENCY, FRACTURES): Vit D, 25-Hydroxy: 18.79 ng/mL — ABNORMAL LOW (ref 30–100)

## 2020-05-23 MED ORDER — EPOETIN ALFA 10000 UNIT/ML IJ SOLN
4000.0000 [IU] | INTRAMUSCULAR | Status: DC
  Administered 2020-05-27 – 2020-06-03 (×4): 4000 [IU] via INTRAVENOUS
  Filled 2020-05-23: qty 1

## 2020-05-23 MED ORDER — LOSARTAN POTASSIUM 50 MG PO TABS
75.0000 mg | ORAL_TABLET | Freq: Every day | ORAL | Status: DC
  Administered 2020-05-23 – 2020-06-12 (×16): 75 mg via ORAL
  Filled 2020-05-23 (×20): qty 1

## 2020-05-23 NOTE — TOC Progression Note (Signed)
Transition of Care [facility]) - Progression Note    Patient Details  Name: Katie [name] MRN: [id number] Date of Birth: Sep 19, 1955  Transition of Care [facility]) CM/SW Door, RN Phone Number: 05/23/2020, 1:31 PM  Clinical Narrative:   Spoke with patient about discharge to friends house with [facility] PT/OT/RN services. Patient feels that her friend will not be able to provide the care she needs, also there are steps she would need to climb to get to bedroom. I informed her to complete the medicaid application I gave her last week, she says her friend will complete it today. Will discuss with Attending and team for guidance.    Expected Discharge Plan: Skilled Nursing Facility Barriers to Discharge: Continued Medical Work up,Homeless with medical needs  Expected Discharge Plan and Services Expected Discharge Plan: [place] In-house Referral: Clinical Social Work Discharge Planning Services: Homebound not met per provider Post Acute Care Choice: [place] Living arrangements for the past 2 months: No permanent address (Lived with Sister and family)                 DME Arranged: N/A DME Agency: NA       HH Arranged: NA HH Agency: NA         Social Determinants of Health (SDOH) Interventions    Readmission Risk Interventions No flowsheet data found.

## 2020-05-23 NOTE — Progress Notes (Signed)
Patient accepted at [facility] MWF 11:40, patient can start on Wednesday 1/12 at 11:00am.

## 2020-05-23 NOTE — Progress Notes (Signed)
[facility] Progress Note  Patient: Katie [name]    PYP:[id number]  DOA: 04/30/2020     Date of Service: the patient was seen and examined on 05/23/2020  Chief Complaint  Patient presents with  . Weakness   Brief hospital course: Katie [name] is a 65 y.o. female with a history of diabetes mellitus type 2, stage IV CKD, heart failure, hypertension. Patient presented secondary to dyspnea with evidence of volume overload and pleural effusion.  1/3: PermCath placed in right chest 1/4: Patient endorsing significant pain around PermCath insertion site   Currently further plan is to continue hemodialysis as per nephrology, transfuse PRBC if hemoglobin drops below 7 Patient has no insurance, awaiting for placement  Assessment and Plan: Principal Problem:   Volume overload Active Problems:   Type 2 diabetes mellitus with stage 5 chronic kidney disease ([place])   Hypertension   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HCC)   Hyperkalemia   Pressure injury of skin   Pleural effusion   Acute heart failure with preserved ejection fraction (HFpEF) (HCC)   Chronic kidney disease (CKD), stage IV (severe) (HCC)   Pulmonary hypertension, unspecified (HCC)  Volume overload In setting of CKD. Cardiomegaly on chest x-ray.  Started on Lasix IV on admission but has now progressed to requiring hemodialysis. CKD likely progressed to ESRD Plan: Nephrology following for hemodialysis PermCath placed in right chest Plan for repeat hemodialysis tomorrow Nephrology looking for outpatient HD chair  Pleural effusion In setting of fluid overload with new diagnosis of grade 2 diastolic heart failure.  No previous history of pleural effusion.  Diagnostic right thoracentesis performed on 12/20.  No evidence of malignancy on cytology  Diabetes mellitus, type 2 Hemoglobin A1C of 4.7%.  No need for pharmacologic treatment. Diet control.  ESRD Unknown baseline.  Patient is unsure of her  primary nephrologist in [place].  Creatinine of 2.97 on admission.  Per patient, she was planned to start hemodialysis in addition to consideration for renal transplant.  Nephrology consulted on 12/20.  Worsening renal function in setting of attempted Lasix diuresis for heart failure. Nephrology now started hemodialysis. Plan: Nephrology looking for outpatient hemodialysis chair   Acute diastolic heart failure New diagnosis. Grade 2 dysfunction. Normal EF.  Associated RV systolic dysfunction with elevated PA pressure seen on TTE although RHC on 12/28 significant for mildly elevated right heart/pulmonary artery pressures.  Net -8.7 L over admission Plan: Continue Lasix 40 mg IV twice daily Daily weights Strict I's and O's  Moderate pericardial effusion No clinical evidence of tamponade.  No recent infections.  In setting of CKD V with elevated BUN. No chest pain. Plan: -Cardiology recommendation: outpatient Transthoracic Echocardiogram for surveillance  Mild/moderate aortic stenosis Unsure if this is symptomatic.  Patient does have dyspnea with exertion but this may be multifactorial. -Cardiology recommendations: outpatient Transthoracic Echocardiogram for surveillance  Primary hypertension Patient is on at least hydrochlorothiazide as an outpatient but is unsure of her regimen.  Improved control -Losartan, carvedilol, amlodipine -Lasix as above  Anemia Possible related to underlying kidney disease.  Evidence of macrocytosis on CBC.  TIBC low but otherwise normal iron panel. Vitamin B12 level 358, target > 400, started oral supplement and folate normal.  Hyperkalemia In setting of CKD. Lokelma 10 g given with improvement.  Pressure injury Mid coccyx, POA  History of tobacco use Patient has quit for at least 10 years. Previously, 20 pack year history.  Headache Normal chronic headache. Patient states she takes Imitrex as  needed.  Continue Tylenol and  oxycodone as needed 1/8 s/p  Fioricet 1 dose 1/9 IV Solu-Medrol 60 mg x 1 dose, Benadryl 25 mg IV x1 dose and Phenergan 25 mg IV x1 dose given  1/10 Headache improved, patient slept well it seems insomnia is contributing to headache  Insomnia, started trazodone and melatonin Continue melatonin and trazodone nightly, seems to be helping patient to sleep   Obesity Body mass index is 30.91 kg/m.   Body mass index is 30.36 kg/m.  Nutrition Problem: Increased nutrient needs Etiology: chronic illness (CKD IV with temp HD, CHF) Interventions:    Pressure Injury 04/30/20 Coccyx Mid Stage 2 -  Partial thickness loss of dermis presenting as a shallow open injury with a red, pink wound bed without slough. (Active)  04/30/20 2130  Location: Coccyx  Location Orientation: Mid  Staging: Stage 2 -  Partial thickness loss of dermis presenting as a shallow open injury with a red, pink wound bed without slough.  Wound Description (Comments):   Present on Admission: Yes     Diet: Renal diet DVT Prophylaxis: Subcutaneous Heparin    Advance goals of care discussion: Full code  Family Communication: family was not present at bedside, at the time of interview.  The pt provided permission to discuss medical plan with the family. Opportunity was given to ask question and all questions were answered satisfactorily.   Disposition:  Pt is from home, admitted with renal failure, ESRD on hemodialysis, still requires hemodialysis, does not have insurance so awaiting for SNF placement and insurance application  Discharge to SNF vs friend's house after insurance application and hemodialysis set up done as an outpatient versus SNF placement. 1/7 accepted at [facility], awaiting for financial clearance and NSF except hands and bed availability  Subjective: No significant acute overnight issues, patient slept well last night, headache improved with about treatment given yesterday.  Patient is  requesting to continue same treatment for sleeping. Patient denied any other active issues, awaiting for placement   Physical Exam: General:  alert oriented to time, place, and person.  Appear in no distress, affect appropriate Eyes: PERRLA ENT: Oral Mucosa Clear, moist  Neck: no JVD,  Cardiovascular: S1 and S2 Present, no Murmur,  Respiratory: good respiratory effort, Bilateral Air entry equal and Decreased, no Crackles, no wheezes Abdomen: Bowel Sound present, Soft and no tenderness,  Skin: No rashes Extremities: no Pedal edema, no calf tenderness Neurologic: without any new focal findings Gait not checked due to patient safety concerns  Vitals:   05/22/20 2112 05/23/20 0550 05/23/20 0814 05/23/20 1151  BP: (!) 153/75 (!) 152/76 (!) 146/84 (!) 157/68  Pulse: 81 71 77 72  Resp: 18 16 16 14   Temp: 98.1 F (36.7 C) 98.1 F (36.7 C) 97.8 F (36.6 C) 98.6 F (37 C)  TempSrc: Oral Oral Oral Oral  SpO2: 96% 97% 97% 97%  Weight:      Height:        Intake/Output Summary (Last 24 hours) at 05/23/2020 1411 Last data filed at 05/23/2020 1028 Gross per 24 hour  Intake 480 ml  Output 700 ml  Net -220 ml   Filed Weights   05/20/20 0532 05/21/20 0534 05/22/20 0604  Weight: 84.7 kg 84.6 kg 84.4 kg    Data Reviewed: I have personally reviewed and interpreted daily labs, tele strips, imagings as discussed above. I reviewed all nursing notes, pharmacy notes, vitals, pertinent old records I have discussed plan of care as described above  with RN and patient/family.  CBC: Recent Labs  Lab 05/17/20 0801 05/18/20 0417 05/19/20 0619 05/20/20 0727 05/21/20 0409 05/22/20 0553 05/23/20 0521  WBC 5.9 5.8 6.3 7.0 6.1 5.4 6.6  NEUTROABS 3.1 2.9  --   --   --   --   --   HGB 7.8* 7.2* 7.6* 7.5* 8.2* 7.8* 8.0*  HCT 24.8* 23.6* 24.9* 24.5* 25.6* 24.5* 24.6*  MCV 98.0 98.7 100.4* 98.8 96.2 96.8 95.3  PLT 215 217 227 236 230 233 902   Basic Metabolic Panel: Recent Labs  Lab  05/17/20 0614 05/18/20 0417 05/19/20 0619 05/20/20 0610 05/21/20 0409 05/22/20 0553 05/23/20 0521  NA 140 140 141 143 141 140 139  K 4.4 4.5 4.6 5.0 3.9 4.0 4.2  CL 105 104 105 107 105 105 104  CO2 29 28 28 24 28 29 26   GLUCOSE 91 91 95 95 108* 112* 99  BUN 43* 49* 52* 59* 32* 43* 52*  CREATININE 2.38* 2.80* 3.13* 3.37* 1.92* 2.68* 2.92*  CALCIUM 7.9* 8.0* 8.0* 8.2* 8.0* 8.0* 8.3*  MG 1.8 1.9 2.0 2.1 1.9  --   --   PHOS 5.1* 5.8* 6.2* 6.2* 4.2  --   --     Studies: No results found.  Scheduled Meds: . amLODipine  10 mg Oral Daily  . benzonatate  200 mg Oral TID  . carvedilol  12.5 mg Oral BID WC  . Chlorhexidine Gluconate Cloth  6 each Topical Q0600  . feeding supplement  1 Container Oral TID BM  . heparin  5,000 Units Subcutaneous Q8H  . insulin aspart  0-9 Units Subcutaneous TID WC  . lidocaine  1 patch Transdermal Q24H  . losartan  75 mg Oral Daily  . melatonin  5 mg Oral QPM  . methocarbamol  500 mg Oral QID  . multivitamin  1 tablet Oral QHS  . pneumococcal 23 valent vaccine  0.5 mL Intramuscular Tomorrow-1000  . polyethylene glycol  17 g Oral Daily  . senna-docusate  1 tablet Oral BID  . sevelamer carbonate  800 mg Oral TID WC  . sodium chloride flush  3 mL Intravenous Q12H  . topiramate  25 mg Oral BID  . torsemide  40 mg Oral Daily  . traZODone  50 mg Oral QPM  . vitamin B-12  100 mcg Oral Daily   Continuous Infusions: . sodium chloride     PRN Meds: sodium chloride, acetaminophen **OR** acetaminophen, ALPRAZolam, bisacodyl, chlorpheniramine-HYDROcodone, ondansetron **OR** ondansetron (ZOFRAN) IV, oxyCODONE-acetaminophen, promethazine, promethazine, sodium chloride flush  Time spent: 35 minutes  Author: Val Riles. MD [facility] 05/23/2020 2:11 PM  To reach On-call, see care teams to locate the attending and reach out to them via [email or web address]. If 7PM-7AM, please contact night-coverage If you still have difficulty reaching the attending  provider, please page the [facility] (Director on Call) for [facility] on amion for assistance.

## 2020-05-23 NOTE — Progress Notes (Signed)
[facility]  ROUNDING NOTE   Subjective:   Doing fair.  Denies any acute complaints Reports mild headache this morning also  Objective:  Vital signs in last 24 hours:  Temp:  [97.8 F (36.6 C)-98.6 F (37 C)] 98.6 F (37 C) (01/10 1151) Pulse Rate:  [71-81] 72 (01/10 1151) Resp:  [14-19] 14 (01/10 1151) BP: (146-157)/(68-84) 157/68 (01/10 1151) SpO2:  [93 %-97 %] 97 % (01/10 1151)  Weight change:  Filed Weights   05/20/20 0532 05/21/20 0534 05/22/20 0604  Weight: 84.7 kg 84.6 kg 84.4 kg    Intake/Output: I/O last 3 completed shifts: In: 483 [P.O.:480; I.V.:3] Out: 550 [Urine:550]   Intake/Output this shift:  Total I/O In: 480 [P.O.:480] Out: 700 [Urine:700]  Physical Exam: General:  No acute distress, laying in the bed  HEENT  anicteric, moist oral mucous membrane  Pulm/lungs  normal breathing effort, lungs are clear to auscultation  CVS/Heart  regular rhythm, no rub or gallop  Abdomen:   Soft, nontender  Extremities:  + Dependent peripheral edema  Neurologic:  Alert, oriented, able to follow commands  Skin:  No acute rashes   Access: RIJ permcath 05/16/20 Dr. Lucky Cowboy    Basic Metabolic Panel: Recent Labs  Lab 05/17/20 3329 05/18/20 0417 05/19/20 5188 05/20/20 0610 05/21/20 0409 05/22/20 0553 05/23/20 0521  NA 140 140 141 143 141 140 139  K 4.4 4.5 4.6 5.0 3.9 4.0 4.2  CL 105 104 105 107 105 105 104  CO2 29 28 28 24 28 29 26   GLUCOSE 91 91 95 95 108* 112* 99  BUN 43* 49* 52* 59* 32* 43* 52*  CREATININE 2.38* 2.80* 3.13* 3.37* 1.92* 2.68* 2.92*  CALCIUM 7.9* 8.0* 8.0* 8.2* 8.0* 8.0* 8.3*  MG 1.8 1.9 2.0 2.1 1.9  --   --   PHOS 5.1* 5.8* 6.2* 6.2* 4.2  --   --     Liver Function Tests: Recent Labs  Lab 05/17/20 0614 05/18/20 0417  ALBUMIN 2.1* 2.1*   No results for input(s): LIPASE, AMYLASE in the last 168 hours. No results for input(s): AMMONIA in the last 168 hours.  CBC: Recent Labs  Lab 05/17/20 0801 05/18/20 0417  05/19/20 0619 05/20/20 0727 05/21/20 0409 05/22/20 0553 05/23/20 0521  WBC 5.9 5.8 6.3 7.0 6.1 5.4 6.6  NEUTROABS 3.1 2.9  --   --   --   --   --   HGB 7.8* 7.2* 7.6* 7.5* 8.2* 7.8* 8.0*  HCT 24.8* 23.6* 24.9* 24.5* 25.6* 24.5* 24.6*  MCV 98.0 98.7 100.4* 98.8 96.2 96.8 95.3  PLT 215 217 227 236 230 233 232    Cardiac Enzymes: No results for input(s): CKTOTAL, CKMB, CKMBINDEX, TROPONINI in the last 168 hours.  BNP: Invalid input(s): POCBNP  CBG: Recent Labs  Lab 05/22/20 1320 05/22/20 1702 05/22/20 2148 05/23/20 0815 05/23/20 1149  GLUCAP 135* 180* 175* 79 114*    Microbiology: Results for orders placed or performed during the hospital encounter of 04/30/20  Resp Panel by RT-PCR (Flu A&B, Covid) Nasopharyngeal Swab     Status: None   Collection Time: 04/30/20  4:01 PM   Specimen: Nasopharyngeal Swab; Nasopharyngeal(NP) swabs in vial transport medium  Result Value Ref Range Status   SARS Coronavirus 2 by RT PCR NEGATIVE NEGATIVE Final    Comment: (NOTE) SARS-CoV-2 target nucleic acids are NOT DETECTED.  The SARS-CoV-2 RNA is generally detectable in upper respiratory specimens during the acute phase of infection. The lowest concentration of SARS-CoV-2  viral copies this assay can detect is 138 copies/mL. A negative result does not preclude SARS-Cov-2 infection and should not be used as the sole basis for treatment or other patient management decisions. A negative result may occur with  improper specimen collection/handling, submission of specimen other than nasopharyngeal swab, presence of viral mutation(s) within the areas targeted by this assay, and inadequate number of viral copies(<138 copies/mL). A negative result must be combined with clinical observations, patient history, and epidemiological information. The expected result is Negative.  Fact Sheet for Patients:  EntrepreneurPulse.com.au  Fact Sheet for Healthcare Providers:   IncredibleEmployment.be  This test is no t yet approved or cleared by the [place] FDA and  has been authorized for detection and/or diagnosis of SARS-CoV-2 by FDA under an Emergency Use Authorization (EUA). This EUA will remain  in effect (meaning this test can be used) for the duration of the COVID-19 declaration under Section 564(b)(1) of the Act, 21 U.S.C.section 360bbb-3(b)(1), unless the authorization is terminated  or revoked sooner.       Influenza A by PCR NEGATIVE NEGATIVE Final   Influenza B by PCR NEGATIVE NEGATIVE Final    Comment: (NOTE) The Xpert Xpress SARS-CoV-2/FLU/RSV plus assay is intended as an aid in the diagnosis of influenza from Nasopharyngeal swab specimens and should not be used as a sole basis for treatment. Nasal washings and aspirates are unacceptable for Xpert Xpress SARS-CoV-2/FLU/RSV testing.  Fact Sheet for Patients: EntrepreneurPulse.com.au  Fact Sheet for Healthcare Providers: IncredibleEmployment.be  This test is not yet approved or cleared by the [place] FDA and has been authorized for detection and/or diagnosis of SARS-CoV-2 by FDA under an Emergency Use Authorization (EUA). This EUA will remain in effect (meaning this test can be used) for the duration of the COVID-19 declaration under Section 564(b)(1) of the Act, 21 U.S.C. section 360bbb-3(b)(1), unless the authorization is terminated or revoked.  Performed at [facility], [address] [place], [place] [address]   Body fluid culture     Status: None   Collection Time: 05/02/20  4:44 PM   Specimen: PATH Cytology Pleural fluid  Result Value Ref Range Status   Specimen Description   Final    PLEURAL Performed at [facility], [address] [place], [place] [address]    Special Requests   Final    NONE Performed at [facility], [address] [place], [place] [address]    Gram  Stain   Final    MODERATE WBC PRESENT, PREDOMINANTLY MONONUCLEAR NO ORGANISMS SEEN    Culture   Final    NO GROWTH Performed at [facility], [place] [address], [place], [place] [address]    Report Status 05/06/2020 FINAL  Final  MRSA PCR Screening     Status: None   Collection Time: 05/16/20  9:29 PM   Specimen: Nasal Mucosa; Nasopharyngeal  Result Value Ref Range Status   MRSA by PCR NEGATIVE NEGATIVE Final    Comment:        The GeneXpert MRSA Assay (FDA approved for NASAL specimens only), is one component of a comprehensive MRSA colonization surveillance program. It is not intended to diagnose MRSA infection nor to guide or monitor treatment for MRSA infections. Performed at [facility], [address] [place], [place] [address]     Coagulation Studies: No results for input(s): LABPROT, INR in the last 72 hours.  Urinalysis: No results for input(s): COLORURINE, LABSPEC, [place], [place], [employer], [place], [place], [place],  UROBILINOGEN, NITRITE, LEUKOCYTESUR in the last 72 hours.  Invalid input(s): APPERANCEUR    Imaging: No results found.   Medications:   . sodium chloride     . amLODipine  10 mg Oral Daily  . benzonatate  200 mg Oral TID  . carvedilol  12.5 mg Oral BID WC  . Chlorhexidine Gluconate Cloth  6 each Topical Q0600  . feeding supplement  1 Container Oral TID BM  . heparin  5,000 Units Subcutaneous Q8H  . insulin aspart  0-9 Units Subcutaneous TID WC  . lidocaine  1 patch Transdermal Q24H  . losartan  75 mg Oral Daily  . melatonin  5 mg Oral QPM  . methocarbamol  500 mg Oral QID  . multivitamin  1 tablet Oral QHS  . pneumococcal 23 valent vaccine  0.5 mL Intramuscular Tomorrow-1000  . polyethylene glycol  17 g Oral Daily  . senna-docusate  1 tablet Oral BID  . sevelamer carbonate  800 mg Oral TID WC  . sodium chloride flush  3 mL Intravenous Q12H  . topiramate  25 mg Oral BID  . torsemide  40 mg Oral Daily  .  traZODone  50 mg Oral QPM  . vitamin B-12  100 mcg Oral Daily   sodium chloride, acetaminophen **OR** acetaminophen, ALPRAZolam, bisacodyl, chlorpheniramine-HYDROcodone, ondansetron **OR** ondansetron (ZOFRAN) IV, oxyCODONE-acetaminophen, promethazine, promethazine, sodium chloride flush  Assessment/ Plan:  [name] is a 65 y.o. white female with diastolic congestive heart failure, hypertension, diabetes mellitus type II who is admitted to [facility] on 04/30/2020 for Chronic kidney disease (CKD), stage IV (severe) (HCC) [N18.4] Volume overload [E87.70]  Patient initiated on hemodialysis this admission.   1. End stage renal disease: Secondary to hypertension and atherosclerosis.  History of nonsteroidal use Outpatient dialysis placement in progress -accepted at [facility] MWF 11: 40 shift  2. Hypertension with lower extremity edema: Variable control Currently managed with amlodipine, carvedilol, losartan Torsemide for diuresis  3. Anemia of chronic kidney disease:  Lab Results  Component Value Date   HGB 8.0 (L) 05/23/2020    SPEP/UPEP negative. EPO with HD treatments.        LOS: [place] 1/10/20223:15 PM

## 2020-05-23 NOTE — Progress Notes (Signed)
Mobility Specialist - Progress Note   05/23/20 1546  Mobility  Activity Ambulated in room  Level of Assistance Minimal assist, patient does 75% or more  Assistive Device Front wheel walker  Distance Ambulated (ft) 70 ft  Mobility Response Tolerated well  Mobility performed by Mobility specialist  $Mobility charge 1 Mobility    During mobility: 80 HR, 96% SpO2 Post-mobility: 68 HR, 96% SpO2   Pt was lying in bed upon arrival utilizing room air, with friend present. Pt denied nausea, but did c/o fatigue and pain in common areas (back, neck, head). Pt was able to get EOB with supervision. Pt stood to RW with minA on 1st trial, significant improvement from previous session. Pt ambulated 53' in room this date. No LOB noted. Pt denied SOB. Further ambulation deferred d/t fatigue. Pt sat in recliner as mobility provided assistance with bed change d/t soiled sheets. Pt was able to stand to RW from recliner with minA 1 trial. Pt stood at sink for 3 mins as mobility assisted with peri-care. Pt continues to make significant progress with mobility. Noted mood change with activity, pt more motivated and joyful the last 2 days. Overall, pt tolerated session well. Pt was left in recliner with all needs in reach.   Kathee Delton Mobility Specialist 05/23/20, 3:58 PM

## 2020-05-24 LAB — GLUCOSE, CAPILLARY
Glucose-Capillary: 95 mg/dL (ref 70–99)
Glucose-Capillary: 98 mg/dL (ref 70–99)
Glucose-Capillary: 154 mg/dL — ABNORMAL HIGH (ref 70–99)

## 2020-05-24 MED ORDER — CHLORHEXIDINE GLUCONATE CLOTH 2 % EX PADS
6.0000 | MEDICATED_PAD | Freq: Every day | CUTANEOUS | Status: DC
  Administered 2020-05-24 – 2020-07-02 (×37): 6 via TOPICAL

## 2020-05-24 NOTE — Progress Notes (Signed)
PROGRESS NOTE    Katie [name]  EPP:[id number] DOB: December 18, 1955 DOA: 04/30/2020 PCP: Patient, [name]  Brief Narrative:  [name] a 65 y.o.female with a history of diabetes mellitus type 2, stage IV CKD, heart failure, hypertension. Patient presented secondary to dyspnea with evidence of volume overload and pleural effusion.  Patient has had an extended hospital course.  Her acute medical issues have essentially resolved at this point.  She is medically stable for discharge however we do not have a safe disposition plan.  Patient does not have stable living and lives with a friend here.  She states that the friend likely will not be able to care for her.   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 Cardiomegaly on chest x-ray.  Started on Lasix IV on admission but has now progressed to requiring hemodialysis. CKD likely progressed to ESRD Plan: Nephrology following for hemodialysis PermCath placed in right chest Patient accepted in [place].  Awaiting financial clearance  Pleural effusion In setting of fluid overload with new diagnosis of grade 2 diastolic heart failure.  No previous history of pleural effusion.  Diagnostic right thoracentesis performed on 12/20.  No evidence of malignancy on cytology  Diabetes mellitus, type 2 Hemoglobin A1C of 4.7%.  No need for pharmacologic treatment. Diet control.  ESRD Unknown baseline. Patient is unsure of her primary nephrologist in [place].  Creatinine of 2.97 on admission.   Per patient, she was planned to start hemodialysis in addition to consideration for renal transplant.  Nephrology consulted on 12/20.  Worsening renal function in setting of attempted Lasix diuresis for heart failure. Nephrology now started hemodialysis. Plan: Patient accepted at [facility] Pending financial clearance for discharge planning   Acute diastolic heart failure New diagnosis. Grade 2 dysfunction. Normal EF.  Associated RV systolic dysfunction with elevated PA pressure seen on TTE although RHC on 12/28 significant for mildly elevated right heart/pulmonary artery pressures.  Net -14L over admission Plan: Torsemide 40 mg p.o. daily per nephrology recommendations Daily weights Strict I's and O's  Moderate pericardial effusion No clinical evidence of tamponade.  No recent infections.  In setting of CKD V with elevated BUN. No chest pain. Plan: -Cardiology recommendation: outpatient Transthoracic Echocardiogram for surveillance  Mild/moderate aortic stenosis Unsure if this is symptomatic.  Patient does have dyspnea with exertion but this may be multifactorial. -Cardiology recommendations: outpatient Transthoracic Echocardiogram for surveillance  Primary hypertension Patient is on at least hydrochlorothiazide as an outpatient but is unsure of her regimen. Improved control -Losartan, carvedilol, amlodipine -Lasix as above  Anemia Possible related to underlying kidney disease.  Evidence of macrocytosis on CBC.  TIBC low but otherwise normal iron panel. Vitamin B12 level 358, target > 400, started oral supplement and folate normal.  Hyperkalemia In setting of CKD. Lokelma 10 g given with improvement.  Pressure injury Mid coccyx, POA  History of tobacco use Patient has quit for at least 10 years. Previously, 20 pack year history.  Headache Normal chronic headache. Patient states she takes  Imitrex as needed.  Continue Tylenol and oxycodone as  needed 1/8 s/p  Fioricet 1 dose 1/9 IV Solu-Medrol 60 mg x 1 dose, Benadryl 25 mg IV x1 dose and Phenergan 25 mg IV x1 dose given  1/10 Headache improved, patient slept well it seems insomnia is contributing to headache  Insomnia, started trazodone and melatonin Continue melatonin and trazodone nightly, seems to be helping patient to sleep   Obesity Body mass index is 30.91 kg/m.   DVT prophylaxis: SQ heparin Code Status: Full Family Communication: None today Disposition Plan: Status is: Inpatient  Remains inpatient appropriate because:Unsafe d/c plan   Dispo: The patient is from: Home              Anticipated d/c is to: SNF              Anticipated d/c date is: 1 day              Patient currently is medically stable to d/c.  Patient is currently medically stable for discharge.  She has a excepting hemodialysis chair [place].  We are pending financial clearance and skilled nursing facility placement.  Skilled nursing facility is not an option we may be forced to sent home with home health.       Consultants:   Nephrology  Procedures:   Vas-Cath placement  Antimicrobials:   None   Subjective: Seen and examined.  Reports improvement in headache and sleep.  Objective: Vitals:   05/24/20 1115 05/24/20 1130 05/24/20 1145 05/24/20 1200  BP: 125/62 (!) 147/67 139/64 (!) 136/59  Pulse: 70 70 70 72  Resp: 12 13 12 13   Temp:      TempSrc:      SpO2: 96% 96% 97% 97%  Weight:      Height:        Intake/Output Summary (Last 24 hours) at 05/24/2020 1225 Last data filed at 05/23/2020 1832 Gross per 24 hour  Intake 240 ml  Output 1100 ml  Net -860 ml   Filed Weights   05/21/20 0534 05/22/20 0604 05/24/20 0506  Weight: 84.6 kg 84.4 kg 85.5 kg    Examination:  General exam: Appears calm and comfortable  Respiratory system: Full respiratory effort.  Lungs clear.  Room air Cardiovascular system: S1 & S2 heard, RRR. No JVD, murmurs, rubs,  gallops or clicks. No pedal edema. Gastrointestinal system: Abdomen is nondistended, soft and nontender. No organomegaly or masses felt. Normal bowel sounds heard. Central nervous system: Alert and oriented. No focal neurological deficits. Extremities: Symmetric 5 x 5 power. Skin: No rashes, lesions or ulcers Psychiatry: Judgement and insight appear normal. Mood & affect appropriate.     Data Reviewed: I have personally reviewed following labs and imaging studies  CBC: Recent Labs  Lab 05/18/20 0417 05/19/20 0619 05/20/20 0727 05/21/20 0409 05/22/20 0553 05/23/20 0521  WBC 5.8 6.3 7.0 6.1 5.4 6.6  NEUTROABS 2.9  --   --   --   --   --   HGB 7.2* 7.6* 7.5* 8.2* 7.8* 8.0*  HCT 23.6* 24.9* 24.5* 25.6* 24.5* 24.6*  MCV 98.7 100.4* 98.8 96.2 96.8 95.3  PLT 217 227 236 230 233 532   Basic Metabolic Panel: Recent Labs  Lab 05/18/20 0417 05/19/20 0619 05/20/20 0610 05/21/20 0409 05/22/20 0553 05/23/20 0521  NA 140 141 143 141 140 139  K 4.5 4.6 5.0 3.9 4.0 4.2  CL 104 105 107 105 105 104  CO2 28 28 24 28  29  26  GLUCOSE 91 95 95 108* 112* 99  BUN 49* 52* 59* 32* 43* 52*  CREATININE 2.80* 3.13* 3.37* 1.92* 2.68* 2.92*  CALCIUM 8.0* 8.0* 8.2* 8.0* 8.0* 8.3*  MG 1.9 2.0 2.1 1.9  --   --   PHOS 5.8* 6.2* 6.2* 4.2  --   --    GFR: Estimated Creatinine Clearance: 22 mL/min (A) (by C-G formula based on SCr of 2.92 mg/dL (H)). Liver Function Tests: Recent Labs  Lab 05/18/20 0417  ALBUMIN 2.1*   No results for input(s): LIPASE, AMYLASE in the last 168 hours. No results for input(s): AMMONIA in the last 168 hours. Coagulation Profile: No results for input(s): INR, PROTIME in the last 168 hours. Cardiac Enzymes: No results for input(s): CKTOTAL, CKMB, CKMBINDEX, TROPONINI in the last 168 hours. BNP (last 3 results) No results for input(s): PROBNP in the last 8760 hours. HbA1C: No results for input(s): HGBA1C in the last 72 hours. CBG: Recent Labs  Lab 05/23/20 0815  05/23/20 1149 05/23/20 1619 05/23/20 2040 05/24/20 0731  GLUCAP 79 114* 119* 161* 95   Lipid Profile: No results for input(s): CHOL, HDL, LDLCALC, TRIG, CHOLHDL, LDLDIRECT in the last 72 hours. Thyroid Function Tests: No results for input(s): TSH, T4TOTAL, FREET4, T3FREE, THYROIDAB in the last 72 hours. Anemia Panel: Recent Labs    05/23/20 0921  VITAMINB12 474   Sepsis Labs: No results for input(s): PROCALCITON, LATICACIDVEN in the last 168 hours.  Recent Results (from the past 240 hour(s))  MRSA PCR Screening     Status: None   Collection Time: 05/16/20  9:29 PM   Specimen: Nasal Mucosa; Nasopharyngeal  Result Value Ref Range Status   MRSA by PCR NEGATIVE NEGATIVE Final    Comment:        The GeneXpert MRSA Assay (FDA approved for NASAL specimens only), is one component of a comprehensive MRSA colonization surveillance program. It is not intended to diagnose MRSA infection nor to guide or monitor treatment for MRSA infections. Performed at [facility], [address] [place], [place] [address]          Radiology Studies: No results found.      Scheduled Meds: . amLODipine  10 mg Oral Daily  . benzonatate  200 mg Oral TID  . carvedilol  12.5 mg Oral BID WC  . Chlorhexidine Gluconate Cloth  6 each Topical Q0600  . [START ON 05/25/2020] epoetin (EPOGEN/PROCRIT) injection  4,000 Units Intravenous Q M,W,F-HD  . feeding supplement  1 Container Oral TID BM  . heparin  5,000 Units Subcutaneous Q8H  . insulin aspart  0-9 Units Subcutaneous TID WC  . lidocaine  1 patch Transdermal Q24H  . losartan  75 mg Oral Daily  . melatonin  5 mg Oral QPM  . methocarbamol  500 mg Oral QID  . multivitamin  1 tablet Oral QHS  . pneumococcal 23 valent vaccine  0.5 mL Intramuscular Tomorrow-1000  . polyethylene glycol  17 g Oral Daily  . senna-docusate  1 tablet Oral BID  . sevelamer carbonate  800 mg Oral TID WC  . sodium chloride flush  3 mL Intravenous Q12H   . topiramate  25 mg Oral BID  . torsemide  40 mg Oral Daily  . traZODone  50 mg Oral QPM  . vitamin B-12  100 mcg Oral Daily   Continuous Infusions: . sodium chloride       LOS: 23 days    Time spent: 15 minutes  Sidney Ace, MD [facility] Pager [phone]  If 7PM-7AM, please contact night-coverage 05/24/2020, 12:25 PM

## 2020-05-24 NOTE — Progress Notes (Signed)
Mobility Specialist - Progress Note   05/24/20 1600  Mobility  Activity Ambulated in room  Range of Motion/Exercises Right leg;Left leg (AP, SLR, March)  Level of Assistance Minimal assist, patient does 75% or more  Information systems manager Ambulated (ft) 90 ft  Mobility Response Tolerated well  Mobility performed by Mobility specialist  $Mobility charge 1 Mobility    Pre-mobility: 77 HR, 95% SpO2 Post-mobility: 77 HR, 97% SpO2   Pt was lying in bed upon arrival utilizing room air. Pt c/o pain in common areas (head, neck, and back) "6/10". Pt was motivated to ambulate this date. Pt got to EOB with minA and stood to RW from elevated bed height. Pt ambulated 90' total in room. After ~70' pt c/o of fatigue and a standing rest break was taken. Pt denied SOB. When asked if pt felt weakness in LE, pt stated "its not perfect, but not bad either". Pt continued ambulation in room. No LOB noted. Upon return to EOB, pt performed seated exercises: ankle pumps, straight leg raises, and march 3x10. Overall, pt tolerated session well. Pt returned to supine with supervision. Pt was left in bed with all needs in reach.   Kathee Delton Mobility Specialist 05/24/20, 4:42 PM

## 2020-05-24 NOTE — Progress Notes (Signed)
[facility]  ROUNDING NOTE   Subjective:   Doing fair.  Denies any acute complaints Reports that she does not want to sit because of pain   HEMODIALYSIS FLOWSHEET:  Blood Flow Rate (mL/min): (P) 300 mL/min Arterial Pressure (mmHg): (P) -80 mmHg Venous Pressure (mmHg): 200 mmHg Transmembrane Pressure (mmHg): 60 mmHg Ultrafiltration Rate (mL/min): 330 mL/min Dialysate Flow Rate (mL/min): 600 ml/min Conductivity: Machine : 14.1 Conductivity: Machine : 14.1 Dialysis Fluid Bolus: Normal Saline Bolus Amount (mL): 250 mL     Objective:  Vital signs in last 24 hours:  Temp:  [98 F (36.7 C)-98.9 F (37.2 C)] 98.9 F (37.2 C) (01/11 1428) Pulse Rate:  [67-76] 76 (01/11 1428) Resp:  [11-18] 18 (01/11 1428) BP: (116-167)/(49-73) 163/71 (01/11 1428) SpO2:  [95 %-97 %] 97 % (01/11 1428) Weight:  [85.5 kg] 85.5 kg (01/11 0506)  Weight change:  Filed Weights   05/21/20 0534 05/22/20 0604 05/24/20 0506  Weight: 84.6 kg 84.4 kg 85.5 kg    Intake/Output: I/O last 3 completed shifts: In: 720 [P.O.:720] Out: 1800 [Urine:1800]   Intake/Output this shift:  No intake/output data recorded.  Physical Exam: General:  No acute distress, laying in the bed  HEENT  anicteric, moist oral mucous membrane  Pulm/lungs  normal breathing effort, lungs are clear to auscultation  CVS/Heart  regular rhythm, no rub or gallop  Abdomen:   Soft, nontender  Extremities:  + Dependent peripheral edema  Neurologic:  Alert, oriented, able to follow commands  Skin:  No acute rashes   Access: RIJ permcath 05/16/20 Dr. Lucky Cowboy    Basic Metabolic Panel: Recent Labs  Lab 05/18/20 0417 05/19/20 2130 05/20/20 0610 05/21/20 0409 05/22/20 0553 05/23/20 0521  NA 140 141 143 141 140 139  K 4.5 4.6 5.0 3.9 4.0 4.2  CL 104 105 107 105 105 104  CO2 28 28 24 28 29 26   GLUCOSE 91 95 95 108* 112* 99  BUN 49* 52* 59* 32* 43* 52*  CREATININE 2.80* 3.13* 3.37* 1.92* 2.68* 2.92*  CALCIUM 8.0* 8.0*  8.2* 8.0* 8.0* 8.3*  MG 1.9 2.0 2.1 1.9  --   --   PHOS 5.8* 6.2* 6.2* 4.2  --   --     Liver Function Tests: Recent Labs  Lab 05/18/20 0417  ALBUMIN 2.1*   No results for input(s): LIPASE, AMYLASE in the last 168 hours. No results for input(s): AMMONIA in the last 168 hours.  CBC: Recent Labs  Lab 05/18/20 0417 05/19/20 0619 05/20/20 0727 05/21/20 0409 05/22/20 0553 05/23/20 0521  WBC 5.8 6.3 7.0 6.1 5.4 6.6  NEUTROABS 2.9  --   --   --   --   --   HGB 7.2* 7.6* 7.5* 8.2* 7.8* 8.0*  HCT 23.6* 24.9* 24.5* 25.6* 24.5* 24.6*  MCV 98.7 100.4* 98.8 96.2 96.8 95.3  PLT 217 227 236 230 233 232    Cardiac Enzymes: No results for input(s): CKTOTAL, CKMB, CKMBINDEX, TROPONINI in the last 168 hours.  BNP: Invalid input(s): POCBNP  CBG: Recent Labs  Lab 05/23/20 1149 05/23/20 1619 05/23/20 2040 05/24/20 0731 05/24/20 1608  GLUCAP 114* 119* 161* 95 98    Microbiology: Results for orders placed or performed during the hospital encounter of 04/30/20  Resp Panel by RT-PCR (Flu A&B, Covid) Nasopharyngeal Swab     Status: None   Collection Time: 04/30/20  4:01 PM   Specimen: Nasopharyngeal Swab; Nasopharyngeal(NP) swabs in vial transport medium  Result Value Ref Range Status  SARS Coronavirus 2 by RT PCR NEGATIVE NEGATIVE Final    Comment: (NOTE) SARS-CoV-2 target nucleic acids are NOT DETECTED.  The SARS-CoV-2 RNA is generally detectable in upper respiratory specimens during the acute phase of infection. The lowest concentration of SARS-CoV-2 viral copies this assay can detect is 138 copies/mL. A negative result does not preclude SARS-Cov-2 infection and should not be used as the sole basis for treatment or other patient management decisions. A negative result may occur with  improper specimen collection/handling, submission of specimen other than nasopharyngeal swab, presence of viral mutation(s) within the areas targeted by this assay, and inadequate number of  viral copies(<138 copies/mL). A negative result must be combined with clinical observations, patient history, and epidemiological information. The expected result is Negative.  Fact Sheet for Patients:  EntrepreneurPulse.com.au  Fact Sheet for Healthcare Providers:  IncredibleEmployment.be  This test is no t yet approved or cleared by the [place] FDA and  has been authorized for detection and/or diagnosis of SARS-CoV-2 by FDA under an Emergency Use Authorization (EUA). This EUA will remain  in effect (meaning this test can be used) for the duration of the COVID-19 declaration under Section 564(b)(1) of the Act, 21 U.S.C.section 360bbb-3(b)(1), unless the authorization is terminated  or revoked sooner.       Influenza A by PCR NEGATIVE NEGATIVE Final   Influenza B by PCR NEGATIVE NEGATIVE Final    Comment: (NOTE) The Xpert Xpress SARS-CoV-2/FLU/RSV plus assay is intended as an aid in the diagnosis of influenza from Nasopharyngeal swab specimens and should not be used as a sole basis for treatment. Nasal washings and aspirates are unacceptable for Xpert Xpress SARS-CoV-2/FLU/RSV testing.  Fact Sheet for Patients: EntrepreneurPulse.com.au  Fact Sheet for Healthcare Providers: IncredibleEmployment.be  This test is not yet approved or cleared by the [place] FDA and has been authorized for detection and/or diagnosis of SARS-CoV-2 by FDA under an Emergency Use Authorization (EUA). This EUA will remain in effect (meaning this test can be used) for the duration of the COVID-19 declaration under Section 564(b)(1) of the Act, 21 U.S.C. section 360bbb-3(b)(1), unless the authorization is terminated or revoked.  Performed at [facility], [address] [place], [place] [address]   Body fluid culture     Status: None   Collection Time: 05/02/20  4:44 PM   Specimen: PATH Cytology Pleural  fluid  Result Value Ref Range Status   Specimen Description   Final    PLEURAL Performed at [facility], [address], [place], [place] [address]    Special Requests   Final    NONE Performed at [facility], [address] [place], [place] [address]    Gram Stain   Final    MODERATE WBC PRESENT, PREDOMINANTLY MONONUCLEAR NO ORGANISMS SEEN    Culture   Final    NO GROWTH Performed at [facility], [place] [address] [place], [place] [address]    Report Status 05/06/2020 FINAL  Final  MRSA PCR Screening     Status: None   Collection Time: 05/16/20  9:29 PM   Specimen: Nasal Mucosa; Nasopharyngeal  Result Value Ref Range Status   MRSA by PCR NEGATIVE NEGATIVE Final    Comment:        The GeneXpert MRSA Assay (FDA approved for NASAL specimens only), is one component of a comprehensive MRSA colonization surveillance program. It is not intended to diagnose MRSA infection nor to guide or monitor treatment for MRSA infections.  Performed at [facility], [address] [place], [place] [address]     Coagulation Studies: No results for input(s): LABPROT, INR in the last 72 hours.  Urinalysis: No results for input(s): COLORURINE, LABSPEC, PHURINE, GLUCOSEU, HGBUR, BILIRUBINUR, KETONESUR, PROTEINUR, UROBILINOGEN, NITRITE, LEUKOCYTESUR in the last 72 hours.  Invalid input(s): APPERANCEUR    Imaging: No results found.   Medications:   . sodium chloride     . amLODipine  10 mg Oral Daily  . benzonatate  200 mg Oral TID  . carvedilol  12.5 mg Oral BID WC  . Chlorhexidine Gluconate Cloth  6 each Topical Q0600  . [START ON 05/25/2020] epoetin (EPOGEN/PROCRIT) injection  4,000 Units Intravenous Q M,W,F-HD  . feeding supplement  1 Container Oral TID BM  . heparin  5,000 Units Subcutaneous Q8H  . insulin aspart  0-9 Units Subcutaneous TID WC  . lidocaine  1 patch Transdermal Q24H  . losartan  75 mg Oral Daily  . melatonin  5 mg Oral  QPM  . methocarbamol  500 mg Oral QID  . multivitamin  1 tablet Oral QHS  . pneumococcal 23 valent vaccine  0.5 mL Intramuscular Tomorrow-1000  . polyethylene glycol  17 g Oral Daily  . senna-docusate  1 tablet Oral BID  . sevelamer carbonate  800 mg Oral TID WC  . sodium chloride flush  3 mL Intravenous Q12H  . topiramate  25 mg Oral BID  . torsemide  40 mg Oral Daily  . traZODone  50 mg Oral QPM  . vitamin B-12  100 mcg Oral Daily   sodium chloride, acetaminophen **OR** acetaminophen, ALPRAZolam, bisacodyl, chlorpheniramine-HYDROcodone, ondansetron **OR** ondansetron (ZOFRAN) IV, oxyCODONE-acetaminophen, promethazine, sodium chloride flush  Assessment/ Plan:  [name] is a 65 y.o. white female with diastolic congestive heart failure, hypertension, diabetes mellitus type II who is admitted to [facility] on 04/30/2020 for Chronic kidney disease (CKD), stage IV (severe) (HCC) [N18.4] Volume overload [E87.70]  Patient initiated on hemodialysis this admission.   1. End stage renal disease: Secondary to hypertension and atherosclerosis.  History of nonsteroidal use Outpatient dialysis placement in progress -accepted at [facility] MWF 11: 40 shift Patient seen during dialysis Tolerating well   2. Hypertension with lower extremity edema: Variable control Currently managed with amlodipine, carvedilol, losartan Torsemide for diuresis  3. Anemia of chronic kidney disease:  Lab Results  Component Value Date   HGB 8.0 (L) 05/23/2020    SPEP/UPEP negative. EPO with HD treatments.        LOS: [place] 1/11/20224:22 PM

## 2020-05-24 NOTE — Progress Notes (Signed)
PT Cancellation Note  Patient Details Name: Mackenze Grandison MRN: [id number] DOB: Sep 11, 1955   Cancelled Treatment:    Reason Eval/Treat Not Completed: Patient at procedure or test/unavailable.  Pt currently off unit at dialysis.  Will re-attempt PT treatment session at a later date/time.  Leitha Bleak, PT 05/24/20, 10:08 AM

## 2020-05-25 LAB — GLUCOSE, CAPILLARY
Glucose-Capillary: 85 mg/dL (ref 70–99)
Glucose-Capillary: 121 mg/dL — ABNORMAL HIGH (ref 70–99)
Glucose-Capillary: 83 mg/dL (ref 70–99)
Glucose-Capillary: 102 mg/dL — ABNORMAL HIGH (ref 70–99)

## 2020-05-25 NOTE — Progress Notes (Signed)
PROGRESS NOTE    Katie [name]  GLO:[id number] DOB: 01-24-1956 DOA: 04/30/2020 PCP: Patient, [name]  Brief Narrative:  Katie [name] a 65 y.o.female with a history of diabetes mellitus type 2, stage IV CKD, heart failure, hypertension. Patient presented secondary to dyspnea with evidence of volume overload and pleural effusion.  Patient has had an extended hospital course.  Her acute medical issues have essentially resolved at this point.  She is medically stable for discharge however we do not have a safe disposition plan.  Patient does not have stable living and lives with a friend here.  She states that the friend likely will not be able to care for her.   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 Cardiomegaly on chest x-ray.  Started on Lasix IV on admission but has now progressed to requiring hemodialysis. CKD likely progressed to ESRD Volume status has improved Plan: Nephrology following for hemodialysis PermCath placed in right chest Patient accepted in [place].  Awaiting financial clearance  Pleural effusion In setting of fluid overload with new diagnosis of grade 2 diastolic heart failure.  No previous history of pleural effusion.  Diagnostic right thoracentesis performed on 12/20.  No evidence of malignancy on cytology  Diabetes mellitus, type 2 Hemoglobin A1C of 4.7%.  No need for pharmacologic treatment. Diet control.  ESRD Unknown baseline. Patient is unsure of her primary nephrologist in [place].   Creatinine of 2.97 on admission.  Per patient, she was planned to start hemodialysis in addition to consideration for renal transplant.  Nephrology consulted on 12/20.  Worsening renal function in setting of attempted Lasix diuresis for heart failure. Nephrology now started hemodialysis. Plan: Patient accepted at [facility] Pending financial clearance for discharge planning TOC attempting disposition to SNF   Acute diastolic heart failure New diagnosis. Grade 2 dysfunction. Normal EF.  Associated RV systolic dysfunction with elevated PA pressure seen on TTE although RHC on 12/28 significant for mildly elevated right heart/pulmonary artery pressures.  Net -14L over admission Plan: Torsemide 40 mg p.o. daily per nephrology recommendations Daily weights Strict I's and O's  Moderate pericardial effusion No clinical evidence of tamponade.  No recent infections.  In setting of CKD V with elevated BUN. No chest pain. Plan: -Cardiology recommendation: outpatient Transthoracic Echocardiogram for surveillance  Mild/moderate aortic stenosis Unsure if this is symptomatic.  Patient does have dyspnea with exertion but this may be multifactorial. -Cardiology recommendations: outpatient Transthoracic Echocardiogram for surveillance  Primary hypertension Patient is on at least hydrochlorothiazide as an outpatient but is unsure of her regimen. Improved control -Losartan, carvedilol, amlodipine -Lasix as above  Anemia Possible related to underlying kidney disease.  Evidence of macrocytosis on CBC.  TIBC low but otherwise normal iron panel. Vitamin B12 level 358, target > 400, started oral supplement and folate normal.  Hyperkalemia In setting of CKD. Lokelma 10 g given with improvement.  Pressure injury Mid coccyx, POA  History of tobacco use Patient has quit for at least 10 years. Previously, 20 pack year history.  Headache Normal chronic headache. Patient  states she takes Imitrex as needed.  Continue Tylenol and oxycodone as needed 1/8 s/p  Fioricet 1 dose 1/9 IV Solu-Medrol 60 mg x 1 dose, Benadryl 25 mg IV x1 dose and Phenergan 25 mg IV x1 dose given  1/10 Headache improved, patient slept well it seems insomnia is contributing to headache  Insomnia, started trazodone and melatonin Continue melatonin and trazodone nightly, seems to be helping patient to sleep   Obesity Body mass index is 30.91 kg/m.   DVT prophylaxis: SQ heparin Code Status: Full Family Communication: None today Disposition Plan: Status is: Inpatient  Remains inpatient appropriate because:Unsafe d/c plan   Dispo: The patient is from: Home              Anticipated d/c is to: SNF              Anticipated d/c date is: 1 day              Patient currently is medically stable to d/c.  Patient is currently medically stable for discharge.  She has an outpatient hemodialysis chair arranged.  Patient is uninsured and financially she is may be a barrier.  TOC aware and is looking for options.       Consultants:   Nephrology  Procedures:   Vas-Cath placement  Antimicrobials:   None   Subjective: Patient seen and examined.  Improved headache and sleep.  Does endorse pain and weakness.  Was able to work with mobility specialist yesterday.  Objective: Vitals:   05/24/20 2116 05/25/20 0500 05/25/20 0633 05/25/20 0754  BP: 119/62  (!) 145/59 (!) 162/66  Pulse: 71  66 69  Resp: 18  17 15   Temp: 99.4 F (37.4 C)  97.8 F (36.6 C) 98.3 F (36.8 C)  TempSrc: Oral   Oral  SpO2: 96%  94% 96%  Weight:  82.6 kg    Height:        Intake/Output Summary (Last 24 hours) at 05/25/2020 1106 Last data filed at 05/25/2020 0900 Gross per 24 hour  Intake 240 ml  Output 900 ml  Net -660 ml   Filed Weights   05/22/20 0604 05/24/20 0506 05/25/20 0500  Weight: 84.4 kg 85.5 kg 82.6 kg    Examination:  General exam: Appears calm and comfortable  Respiratory  system: Full respiratory effort.  Lungs clear.  Room air Cardiovascular system: S1 & S2 heard, RRR. No JVD, murmurs, rubs, gallops or clicks.  Trace pedal edema Gastrointestinal system: Abdomen is nondistended, soft and nontender. No organomegaly or masses felt. Normal bowel sounds heard. Central nervous system: Alert and oriented. No focal neurological deficits. Extremities: Symmetric 5 x 5 power. Skin: No rashes, lesions or ulcers Psychiatry: Judgement and insight appear normal. Mood & affect appropriate.     Data Reviewed: I have personally reviewed following labs and imaging studies  CBC: Recent Labs  Lab 05/19/20 0619 05/20/20 0727 05/21/20 0409 05/22/20 0553 05/23/20 0521  WBC 6.3 7.0 6.1 5.4 6.6  HGB 7.6* 7.5* 8.2* 7.8* 8.0*  HCT 24.9* 24.5* 25.6* 24.5* 24.6*  MCV 100.4* 98.8 96.2 96.8 95.3  PLT 227 236 230 233 546   Basic Metabolic Panel: Recent Labs  Lab 05/19/20 0619 05/20/20 0610 05/21/20 0409 05/22/20 0553 05/23/20 0521  NA 141 143 141 140 139  K 4.6 5.0 3.9 4.0 4.2  CL 105 107 105 105 104  CO2 28 24 28 29 26   GLUCOSE 95 95 108* 112* 99  BUN 52* 59* 32* 43* 52*  CREATININE 3.13* 3.37* 1.92* 2.68* 2.92*  CALCIUM 8.0* 8.2* 8.0* 8.0* 8.3*  MG 2.0 2.1 1.9  --   --   PHOS 6.2* 6.2* 4.2  --   --    GFR: Estimated Creatinine Clearance: 21.6 mL/min (A) (by C-G formula based on SCr of 2.92 mg/dL (H)). Liver Function Tests: No results for input(s): AST, ALT, ALKPHOS, BILITOT, PROT, ALBUMIN in the last 168 hours. No results for input(s): LIPASE, AMYLASE in the last 168 hours. No results for input(s): AMMONIA in the last 168 hours. Coagulation Profile: No results for input(s): INR, PROTIME in the last 168 hours. Cardiac Enzymes: No results for input(s): CKTOTAL, CKMB, CKMBINDEX, TROPONINI in the last 168 hours. BNP (last 3 results) No results for input(s): PROBNP in the last 8760 hours. HbA1C: No results for input(s): HGBA1C in the last 72  hours. CBG: Recent Labs  Lab 05/23/20 2040 05/24/20 0731 05/24/20 1608 05/24/20 2119 05/25/20 0749  GLUCAP 161* 95 98 154* 85   Lipid Profile: No results for input(s): CHOL, HDL, LDLCALC, TRIG, CHOLHDL, LDLDIRECT in the last 72 hours. Thyroid Function Tests: No results for input(s): TSH, T4TOTAL, FREET4, T3FREE, THYROIDAB in the last 72 hours. Anemia Panel: Recent Labs    05/23/20 0921  VITAMINB12 474   Sepsis Labs: No results for input(s): PROCALCITON, LATICACIDVEN in the last 168 hours.  Recent Results (from the past 240 hour(s))  MRSA PCR Screening     Status: None   Collection Time: 05/16/20  9:29 PM   Specimen: Nasal Mucosa; Nasopharyngeal  Result Value Ref Range Status   MRSA by PCR NEGATIVE NEGATIVE Final    Comment:        The GeneXpert MRSA Assay (FDA approved for NASAL specimens only), is one component of a comprehensive MRSA colonization surveillance program. It is not intended to diagnose MRSA infection nor to guide or monitor treatment for MRSA infections. Performed at [facility], [address] [place], [place] [address]          Radiology Studies: No results found.      Scheduled Meds: . amLODipine  10 mg Oral Daily  . benzonatate  200 mg Oral TID  . carvedilol  12.5 mg Oral BID WC  . Chlorhexidine Gluconate Cloth  6 each Topical Q0600  . epoetin (EPOGEN/PROCRIT) injection  4,000 Units Intravenous Q M,W,F-HD  . feeding supplement  1 Container Oral TID BM  . heparin  5,000 Units Subcutaneous Q8H  . insulin aspart  0-9 Units Subcutaneous TID WC  . lidocaine  1 patch Transdermal Q24H  . losartan  75 mg Oral Daily  . melatonin  5 mg Oral QPM  . methocarbamol  500 mg Oral QID  . multivitamin  1 tablet Oral QHS  . pneumococcal 23 valent vaccine  0.5 mL Intramuscular Tomorrow-1000  . polyethylene glycol  17 g Oral Daily  . senna-docusate  1 tablet Oral BID  . sevelamer carbonate  800 mg Oral TID WC  . sodium chloride flush   3 mL Intravenous Q12H  . topiramate  25 mg Oral BID  . torsemide  40 mg Oral Daily  . traZODone  50 mg Oral QPM  . vitamin B-12  100 mcg Oral Daily   Continuous Infusions: . sodium chloride       LOS: 24 days    Time spent: 15 minutes    Sidney Ace, MD [facility] Pager [phone]  If 7PM-7AM, please  contact night-coverage 05/25/2020, 11:06 AM

## 2020-05-25 NOTE — Progress Notes (Signed)
Mobility Specialist - Progress Note   05/25/20 1500  Mobility  Activity Contraindicated/medical hold  Mobility performed by Mobility specialist    Per discussion with PT, pt experiencing SOB and pressure in chest during session. Will hold mobility at this time and continue to monitor, attempting session another date as appropriate.    Kathee Delton Mobility Specialist 05/25/20, 3:46 PM

## 2020-05-25 NOTE — Progress Notes (Signed)
Physical Therapy Treatment Patient Details Name: Katie [name] MRN: [id number] DOB: [birth year] Today's Date: 05/25/2020    History of Present Illness Patient is a 65 y.o. female with a history of diabetes mellitus type 2, stage IV CKD, heart failure, hypertension. Patient presented secondary to dyspnea with evidence of volume overload and pleural effusion. Diagnosed with moderate pericardial effusion, mild to moderate aortic insufficiency and aortic stenosis.  S/p R heart cath 12/28.  R temp fem cath placed 12/29; 1/3 R IJ permcath placed; 1/4 R temp fem cath removed.    PT Comments    Pt resting in bed upon PT arrival; agreeable to PT session.  Pt reporting L knee started hurting after moving in bed yesterday.  SBA with bed mobility and CGA with transfers from elevated bed height (per pt request) with RW use.  Pt noted to be incontinent of bowel first time standing so therapist assisted pt with clean-up and changed pt's sheets.  Pt stood again but reporting R chest pressure (5-6/10) that was sharp and also increasing SOB (HR in 70's and O2 sats 92% or greater on room air): nurse notified immediately and pt assisted with laying back down in bed (pt reporting chest pressure improving to 2/10 and SOB improving with laying down in bed).  NT came to give pt CHG bath and nurse arrived shortly after to assess pt.  Will continue to focus on strengthening and progressive functional mobility per pt tolerance.    Follow Up Recommendations  SNF     Equipment Recommendations  Rolling walker with 5" wheels;3in1 (PT)    Recommendations for Other Services OT consult     Precautions / Restrictions Precautions Precautions: Fall Precaution Comments: R IJ permcath Restrictions Weight Bearing Restrictions: No    Mobility  Bed Mobility Overal bed mobility: Needs Assistance Bed Mobility: Supine to Sit;Sit to Supine     Supine to sit: Supervision;HOB elevated Sit to supine: Supervision;HOB elevated    General bed mobility comments: increased effort and time to perform on own  Transfers Overall transfer level: Needs assistance Equipment used: Rolling walker (2 wheeled) Transfers: Sit to/from Stand Sit to Stand: From elevated surface;Min guard         General transfer comment: pt requesting bed height to be elevated to make it easier to stand (pt able to stand with CGA x2 trials from this elevated bed height) up to RW  Ambulation/Gait                 Stairs             Wheelchair Mobility    Modified Rankin (Stroke Patients Only)       Balance Overall balance assessment: Needs assistance Sitting-balance support: No upper extremity supported;Feet supported Sitting balance-Leahy Scale: Good Sitting balance - Comments: steady sitting reaching within BOS   Standing balance support: Bilateral upper extremity supported Standing balance-Leahy Scale: Fair Standing balance comment: steady static standing but requiring B UE support on RW to maintain balance                            Cognition Arousal/Alertness: Awake/alert Behavior During Therapy: WFL for tasks assessed/performed Overall Cognitive Status: Within Functional Limits for tasks assessed                                        Exercises  General Comments   Nursing cleared pt for participation in physical therapy.  Pt agreeable to PT session.      Pertinent Vitals/Pain Pain Assessment: 0-10 Pain Score: 5  Pain Location: R chest pressure Pain Descriptors / Indicators: Pressure;Sharp Pain Intervention(s): Limited activity within patient's tolerance;Monitored during session;Repositioned;Other (comment)     Home Living                      Prior Function            PT Goals (current goals can now be found in the care plan section) Acute Rehab PT Goals Patient Stated Goal: to go home PT Goal Formulation: With patient Time For Goal Achievement:  06/01/20 Potential to Achieve Goals: Fair Progress towards PT goals: Progressing toward goals    Frequency    Min 2X/week      PT Plan Current plan remains appropriate    Co-evaluation              AM-PAC PT "6 Clicks" Mobility   Outcome Measure  Help needed turning from your back to your side while in a flat bed without using bedrails?: A Little Help needed moving from lying on your back to sitting on the side of a flat bed without using bedrails?: A Little Help needed moving to and from a bed to a chair (including a wheelchair)?: A Little Help needed standing up from a chair using your arms (e.g., wheelchair or bedside chair)?: A Little Help needed to walk in hospital room?: A Little Help needed climbing 3-5 steps with a railing? : A Lot 6 Click Score: 17    End of Session Equipment Utilized During Treatment: Gait belt Activity Tolerance: Treatment limited secondary to medical complications (Comment) (pt with R chest pressure and SOB: nurse notified immediately) Patient left: in bed;with bed alarm set;with nursing/sitter in room;Other (comment) (NT present to give pt CHG bath; NT reported she would give pt phone/call light once finished) Nurse Communication: Mobility status;Precautions (pt's R chest pressure and SOB) PT Visit Diagnosis: Unsteadiness on feet (R26.81);Muscle weakness (generalized) (M62.81);History of falling (Z91.81);Other abnormalities of gait and mobility (R26.89)     Time: 4235-3614 PT Time Calculation (min) (ACUTE ONLY): 28 min  Charges:  $Therapeutic Activity: 23-37 mins                     Leitha Bleak, PT 05/25/20, 4:24 PM

## 2020-05-25 NOTE — Progress Notes (Signed)
[facility]  ROUNDING NOTE   Subjective:   1/12: States she is working with physical therapist and is able to walk in the room.  However still describes difficulty with sitting.   Objective:  Vital signs in last 24 hours:  Temp:  [97.8 F (36.6 C)-99.4 F (37.4 C)] 98.4 F (36.9 C) (01/12 1137) Pulse Rate:  [66-71] 68 (01/12 1137) Resp:  [15-18] 17 (01/12 1137) BP: (119-162)/(59-66) 133/64 (01/12 1137) SpO2:  [94 %-96 %] 96 % (01/12 1137) Weight:  [82.6 kg] 82.6 kg (01/12 0500)  Weight change: -2.994 kg Filed Weights   05/22/20 0604 05/24/20 0506 05/25/20 0500  Weight: 84.4 kg 85.5 kg 82.6 kg    Intake/Output: I/O last 3 completed shifts: In: -  Out: 900 [Urine:900]   Intake/Output this shift:  Total I/O In: 240 [P.O.:240] Out: -   Physical Exam: General:  No acute distress, laying in the bed  HEENT  anicteric, moist oral mucous membrane  Pulm/lungs  normal breathing effort, lungs are clear to auscultation  CVS/Heart  regular rhythm, no rub or gallop  Abdomen:   Soft, nontender  Extremities:  + Dependent peripheral edema  Neurologic:  Alert, oriented, able to follow commands  Skin:  No acute rashes   Access: RIJ permcath 05/16/20 Dr. Lucky Cowboy    Basic Metabolic Panel: Recent Labs  Lab 05/19/20 3716 05/20/20 0610 05/21/20 0409 05/22/20 0553 05/23/20 0521  NA 141 143 141 140 139  K 4.6 5.0 3.9 4.0 4.2  CL 105 107 105 105 104  CO2 28 24 28 29 26   GLUCOSE 95 95 108* 112* 99  BUN 52* 59* 32* 43* 52*  CREATININE 3.13* 3.37* 1.92* 2.68* 2.92*  CALCIUM 8.0* 8.2* 8.0* 8.0* 8.3*  MG 2.0 2.1 1.9  --   --   PHOS 6.2* 6.2* 4.2  --   --     Liver Function Tests: No results for input(s): AST, ALT, ALKPHOS, BILITOT, PROT, ALBUMIN in the last 168 hours. No results for input(s): LIPASE, AMYLASE in the last 168 hours. No results for input(s): AMMONIA in the last 168 hours.  CBC: Recent Labs  Lab 05/19/20 0619 05/20/20 0727 05/21/20 0409  05/22/20 0553 05/23/20 0521  WBC 6.3 7.0 6.1 5.4 6.6  HGB 7.6* 7.5* 8.2* 7.8* 8.0*  HCT 24.9* 24.5* 25.6* 24.5* 24.6*  MCV 100.4* 98.8 96.2 96.8 95.3  PLT 227 236 230 233 232    Cardiac Enzymes: No results for input(s): CKTOTAL, CKMB, CKMBINDEX, TROPONINI in the last 168 hours.  BNP: Invalid input(s): POCBNP  CBG: Recent Labs  Lab 05/24/20 0731 05/24/20 1608 05/24/20 2119 05/25/20 0749 05/25/20 1135  GLUCAP 95 98 154* 85 121*    Microbiology: Results for orders placed or performed during the hospital encounter of 04/30/20  Resp Panel by RT-PCR (Flu A&B, Covid) Nasopharyngeal Swab     Status: None   Collection Time: 04/30/20  4:01 PM   Specimen: Nasopharyngeal Swab; Nasopharyngeal(NP) swabs in vial transport medium  Result Value Ref Range Status   SARS Coronavirus 2 by RT PCR NEGATIVE NEGATIVE Final    Comment: (NOTE) SARS-CoV-2 target nucleic acids are NOT DETECTED.  The SARS-CoV-2 RNA is generally detectable in upper respiratory specimens during the acute phase of infection. The lowest concentration of SARS-CoV-2 viral copies this assay can detect is 138 copies/mL. A negative result does not preclude SARS-Cov-2 infection and should not be used as the sole basis for treatment or other patient management decisions. A negative result may  occur with  improper specimen collection/handling, submission of specimen other than nasopharyngeal swab, presence of viral mutation(s) within the areas targeted by this assay, and inadequate number of viral copies(<138 copies/mL). A negative result must be combined with clinical observations, patient history, and epidemiological information. The expected result is Negative.  Fact Sheet for Patients:  EntrepreneurPulse.com.au  Fact Sheet for Healthcare Providers:  IncredibleEmployment.be  This test is no t yet approved or cleared by the [place] FDA and  has been authorized for detection  and/or diagnosis of SARS-CoV-2 by FDA under an Emergency Use Authorization (EUA). This EUA will remain  in effect (meaning this test can be used) for the duration of the COVID-19 declaration under Section 564(b)(1) of the Act, 21 U.S.C.section 360bbb-3(b)(1), unless the authorization is terminated  or revoked sooner.       Influenza A by PCR NEGATIVE NEGATIVE Final   Influenza B by PCR NEGATIVE NEGATIVE Final    Comment: (NOTE) The Xpert Xpress SARS-CoV-2/FLU/RSV plus assay is intended as an aid in the diagnosis of influenza from Nasopharyngeal swab specimens and should not be used as a sole basis for treatment. Nasal washings and aspirates are unacceptable for Xpert Xpress SARS-CoV-2/FLU/RSV testing.  Fact Sheet for Patients: EntrepreneurPulse.com.au  Fact Sheet for Healthcare Providers: IncredibleEmployment.be  This test is not yet approved or cleared by the [place] FDA and has been authorized for detection and/or diagnosis of SARS-CoV-2 by FDA under an Emergency Use Authorization (EUA). This EUA will remain in effect (meaning this test can be used) for the duration of the COVID-19 declaration under Section 564(b)(1) of the Act, 21 U.S.C. section 360bbb-3(b)(1), unless the authorization is terminated or revoked.  Performed at [facility], [address] [place], [place] [address]   Body fluid culture     Status: None   Collection Time: 05/02/20  4:44 PM   Specimen: PATH Cytology Pleural fluid  Result Value Ref Range Status   Specimen Description   Final    PLEURAL Performed at [facility], [address], [place], [place] [address]    Special Requests   Final    NONE Performed at [facility], [address] [place], [place] [address]    Gram Stain   Final    MODERATE WBC PRESENT, PREDOMINANTLY MONONUCLEAR NO ORGANISMS SEEN    Culture   Final    NO GROWTH Performed at [facility], [place] [address] [place], [place] [address]    Report Status 05/06/2020 FINAL  Final  MRSA PCR Screening     Status: None   Collection Time: 05/16/20  9:29 PM   Specimen: Nasal Mucosa; Nasopharyngeal  Result Value Ref Range Status   MRSA by PCR NEGATIVE NEGATIVE Final    Comment:        The GeneXpert MRSA Assay (FDA approved for NASAL specimens only), is one component of a comprehensive MRSA colonization surveillance program. It is not intended to diagnose MRSA infection nor to guide or monitor treatment for MRSA infections. Performed at [facility], [address] [place],  [address]     Coagulation Studies: No results for input(s): LABPROT, INR in the last 72 hours.  Urinalysis: No results for input(s): COLORURINE, LABSPEC, PHURINE, GLUCOSEU, HGBUR, BILIRUBINUR, KETONESUR, PROTEINUR, UROBILINOGEN, NITRITE, LEUKOCYTESUR in the last 72 hours.  Invalid input(s): APPERANCEUR    Imaging: No results found.   Medications:   . sodium chloride     . amLODipine  10 mg Oral  Daily  . benzonatate  200 mg Oral TID  . carvedilol  12.5 mg Oral BID WC  . Chlorhexidine Gluconate Cloth  6 each Topical Q0600  . epoetin (EPOGEN/PROCRIT) injection  4,000 Units Intravenous Q M,W,F-HD  . feeding supplement  1 Container Oral TID BM  . heparin  5,000 Units Subcutaneous Q8H  . insulin aspart  0-9 Units Subcutaneous TID WC  . lidocaine  1 patch Transdermal Q24H  . losartan  75 mg Oral Daily  . melatonin  5 mg Oral QPM  . methocarbamol  500 mg Oral QID  . multivitamin  1 tablet Oral QHS  . pneumococcal 23 valent vaccine  0.5 mL Intramuscular Tomorrow-1000  . polyethylene glycol  17 g Oral Daily  . senna-docusate  1 tablet Oral BID  . sevelamer carbonate  800 mg Oral TID WC  . sodium chloride flush  3 mL Intravenous Q12H  . topiramate  25 mg Oral BID  . torsemide  40 mg Oral Daily  . traZODone  50 mg Oral QPM  . vitamin B-12  100 mcg Oral Daily   sodium chloride,  acetaminophen **OR** acetaminophen, ALPRAZolam, bisacodyl, chlorpheniramine-HYDROcodone, ondansetron **OR** ondansetron (ZOFRAN) IV, oxyCODONE-acetaminophen, promethazine, sodium chloride flush  Assessment/ Plan:  Ms. Ruthella Kirchman is a 65 y.o. white female with diastolic congestive heart failure, hypertension, diabetes mellitus type II who is admitted to [facility] on 04/30/2020 for Chronic kidney disease (CKD), stage IV (severe) (HCC) [N18.4] Volume overload [E87.70]  Patient initiated on hemodialysis this admission.   1. End stage renal disease: Secondary to hypertension and atherosclerosis.  History of nonsteroidal use Outpatient dialysis placement in progress -accepted at [facility] MWF 11:40 shift Next hemodialysis planned for Friday. Considering dialysis twice a week, since she has good residual renal function  2. Hypertension with lower extremity edema: Variable control Currently managed with amlodipine, carvedilol, losartan Torsemide for diuresis  3. Anemia of chronic kidney disease:  Lab Results  Component Value Date   HGB 8.0 (L) 05/23/2020    SPEP/UPEP negative. EPO with HD treatments.        LOS: 24 Katie [name] 1/12/20224:16 PM

## 2020-05-26 ENCOUNTER — Ambulatory Visit: Payer: Self-pay | Admitting: Family

## 2020-05-26 LAB — CBC WITH DIFFERENTIAL/PLATELET
Abs Immature Granulocytes: 0.02 10*3/uL (ref 0.00–0.07)
Basophils Absolute: 0 10*3/uL (ref 0.0–0.1)
Basophils Relative: 1 %
Eosinophils Absolute: 0.1 10*3/uL (ref 0.0–0.5)
Eosinophils Relative: 2 %
HCT: 24.9 % — ABNORMAL LOW (ref 36.0–46.0)
Hemoglobin: 7.8 g/dL — ABNORMAL LOW (ref 12.0–15.0)
Immature Granulocytes: 0 %
Lymphocytes Relative: 36 %
Lymphs Abs: 2.5 10*3/uL (ref 0.7–4.0)
MCH: 30.4 pg (ref 26.0–34.0)
MCHC: 31.3 g/dL (ref 30.0–36.0)
MCV: 96.9 fL (ref 80.0–100.0)
Monocytes Absolute: 0.7 10*3/uL (ref 0.1–1.0)
Monocytes Relative: 10 %
Neutro Abs: 3.5 10*3/uL (ref 1.7–7.7)
Neutrophils Relative %: 51 %
Platelets: 237 10*3/uL (ref 150–400)
RBC: 2.57 MIL/uL — ABNORMAL LOW (ref 3.87–5.11)
RDW: 13.2 % (ref 11.5–15.5)
WBC: 6.9 10*3/uL (ref 4.0–10.5)
nRBC: 0 % (ref 0.0–0.2)

## 2020-05-26 LAB — BASIC METABOLIC PANEL
Anion gap: 8 (ref 5–15)
BUN: 43 mg/dL — ABNORMAL HIGH (ref 8–23)
CO2: 27 mmol/L (ref 22–32)
Calcium: 8.4 mg/dL — ABNORMAL LOW (ref 8.9–10.3)
Chloride: 104 mmol/L (ref 98–111)
Creatinine, Ser: 2.63 mg/dL — ABNORMAL HIGH (ref 0.44–1.00)
GFR, Estimated: 20 mL/min — ABNORMAL LOW (ref >60)
Glucose, Bld: 108 mg/dL — ABNORMAL HIGH (ref 70–99)
Potassium: 4.2 mmol/L (ref 3.5–5.1)
Sodium: 139 mmol/L (ref 135–145)

## 2020-05-26 LAB — GLUCOSE, CAPILLARY
Glucose-Capillary: 86 mg/dL (ref 70–99)
Glucose-Capillary: 96 mg/dL (ref 70–99)
Glucose-Capillary: 111 mg/dL — ABNORMAL HIGH (ref 70–99)
Glucose-Capillary: 114 mg/dL — ABNORMAL HIGH (ref 70–99)

## 2020-05-26 MED ORDER — ENSURE ENLIVE PO LIQD
237.0000 mL | ORAL | Status: DC
  Administered 2020-05-26 – 2020-05-28 (×2): 237 mL via ORAL

## 2020-05-26 NOTE — Progress Notes (Signed)
Nutrition Follow-up  DOCUMENTATION CODES:   Obesity unspecified  INTERVENTION:  D/c Boost Breeze  Ensure Enlive po daily, each supplement provides 350 kcal and 20 grams of protein  Continue Rena-vit QHS  Encouraged po intake  NUTRITION DIAGNOSIS:   Increased nutrient needs related to chronic illness (CKD IV with temp HD, CHF) as evidenced by estimated needs. -ongoing  GOAL:   Patient will meet greater than or equal to 90% of their needs -unmet  MONITOR:   PO intake,Supplement acceptance,Labs,Weight trends,Skin,I & O's  REASON FOR ASSESSMENT:   LOS    ASSESSMENT:   65 y/o female with h/o CKD IV, CHF, DM and [place] who is admitted with AKI and volume overload.  Pt is s/p HD on 1/11   Pt laying quietly in bed this afternoon holding stuffed animal. She reports poor appetite over the past few days. States she has stuff on her mind and tired of eating oatmeal everyday for the past month. She really enjoys soups, but unable to order due to renal diet. Pt does not like Nepro or Boost Breeze supplement. She reports liking Ensure, says she used to drink the chocolate flavor with her dad. Labs stable, will order daily to help her meet her needs.   Meal intake 25-100% of the last 6 documented intakes 1/7-1/13 (85% average)   Weights down 16 lbs this admit; Net -21 L I/Os: -310 ml  x 24 hrs UOP: 550 ml x 24 hrs  Patient currently stable for discharge, outpatient HD chair arranged and is pending placement at SNF.   Medications reviewed and include: SSI, Melatonin, Robaxin, Rena-vit, Miralax, Senokot, Renvela, Demadex, B12  Labs: CBGs [phone], BUN 43 (H), Cr 2.63 (H), K 4.2 (WNL) 1/8 Phos 4.2 (WNL)   Diet Order:   Diet Order            Diet renal with fluid restriction Fluid restriction: 1200 mL Fluid; Room service appropriate? Yes; Fluid consistency: Thin  Diet effective now                 EDUCATION NEEDS:   Education needs have been addressed  Skin:  Skin  Assessment: Reviewed RN Assessment (ecchymosis, Stage II coccyx)  Last BM:  1/12- type 5 (brown;large)  Height:   Ht Readings from Last 1 Encounters:  05/16/20 5\' 6"  (1.676 m)    Weight:   Wt Readings from Last 1 Encounters:  05/26/20 83.4 kg    Ideal Body Weight:  59 kg  BMI:  Body mass index is 29.67 kg/m.  Estimated Nutritional Needs:   Kcal:  1900-2200kcal/day  Protein:  95-110g/day  Fluid:  UOP +1L   Lajuan Lines, RD, LDN Clinical Nutrition After Hours/Weekend Pager # in [place]

## 2020-05-26 NOTE — Progress Notes (Signed)
PT Cancellation Note  Patient Details Name: Katie [name] MRN: [id number] DOB: 1956/03/29   Cancelled Treatment:    Reason Eval/Treat Not Completed: Other (comment)   Pt stated she had recently walked with mobility team.  Felt stronger with no c/o chest pain/pressure SOB per pt.  Declined session at this time.   Chesley Noon 05/26/2020, 3:28 PM

## 2020-05-26 NOTE — TOC Progression Note (Signed)
Transition of Care [facility]) - Progression Note    Patient Details  Name: Katie [name] MRN: [id number] Date of Birth: 01-09-1956  Transition of Care [facility]) CM/SW Contact  Kerin Salen, RN Phone Number: 05/26/2020, 3:38 PM  Clinical Narrative:   Spoke with Carmin Richmond of [facility] SNF, about admission, LOG for patient. FL2, Attending and PT note faxed per request. Pending decision.    Expected Discharge Plan: Skilled Nursing Facility Barriers to Discharge: Continued Medical Work up,Homeless with medical needs  Expected Discharge Plan and Services Expected Discharge Plan: [place] In-house Referral: Clinical Social Work Discharge Planning Services: Homebound not met per provider Post Acute Care Choice: [place] Living arrangements for the past 2 months: No permanent address (Lived with Sister and family)                 DME Arranged: N/A DME Agency: NA       HH Arranged: NA HH Agency: NA         Social Determinants of Health (SDOH) Interventions    Readmission Risk Interventions No flowsheet data found.

## 2020-05-26 NOTE — TOC Progression Note (Signed)
Transition of Care [facility]) - Progression Note    Patient Details  Name: Katie [name] MRN: [id number] Date of Birth: [birth year]  Transition of Care [facility]) CM/SW [place], RN Phone Number: 05/26/2020, 12:02 PM  Clinical Narrative:    LVMM with Kathrynn Ducking @ [facility] [phone] requesting review for patient with LOG and HD.    Expected Discharge Plan: Skilled Nursing Facility Barriers to Discharge: Continued Medical Work up,Homeless with medical needs  Expected Discharge Plan and Services Expected Discharge Plan: [place] In-house Referral: Clinical Social Work Discharge Planning Services: Homebound not met per provider Post Acute Care Choice: [place] Living arrangements for the past 2 months: No permanent address (Lived with Sister and family)                 DME Arranged: N/A DME Agency: NA       HH Arranged: NA HH Agency: NA         Social Determinants of Health (SDOH) Interventions    Readmission Risk Interventions No flowsheet data found.

## 2020-05-26 NOTE — Progress Notes (Signed)
Mobility Specialist - Progress Note   05/26/20 1400  Mobility  Activity Ambulated in room  Range of Motion/Exercises All extremities (HR, SLR, MARCH, EF, WC)  Level of Assistance Standby assist, set-up cues, supervision of patient - no hands on  Assistive Device Front wheel walker  Distance Ambulated (ft) 100 ft  Mobility Response Tolerated well  Mobility performed by Mobility specialist  $Mobility charge 1 Mobility    During mobility: HR, BP, SpO2   Pt was lying in bed upon arrival utilizing room air. Pt agreed to session. Pt denied nausea or fatigue this date, but did c/o headache. Pt expressed that she was a little moody this date d/t family matters with sister. Pt teary-eyed. Mobility offered comfort. Pt was able to get EOB with supervision and extra time. Pt c/o mild dizziness upon sitting. Pt ambulated 100' in room with no LOB noted. Pt feels a little weak in legs "but not too bad". Pt returned to bed and began c/o slight SOB. O2 sat at 95% at time of complaint. Pt performed seated exercises: ankle pumps, straight leg raises, seated march, elbow flexion, and wrist circles before return to supine. Overall, pt tolerated session well. Pt was left in bed with all needs in reach and alarm set. Nurse entered at the end of session and was notified of performance.    Kathee Delton Mobility Specialist 05/26/20, 3:03 PM

## 2020-05-26 NOTE — TOC Progression Note (Addendum)
Transition of Care [facility]) - Progression Note    Patient Details  Name: Katie [name] MRN: [id number] Date of Birth: 10/14/1955  Transition of Care [facility]) CM/SW [place], RN Phone Number: 05/26/2020, 8:32 AM  Clinical Narrative:  Called [facility], [place] SNF's in attempt for discharge, all denied and will not accept LOG. Therefore extended the search, will continue to make calls.    Expected Discharge Plan: Skilled Nursing Facility Barriers to Discharge: Continued Medical Work up,Homeless with medical needs  Expected Discharge Plan and Services Expected Discharge Plan: [place] In-house Referral: Clinical Social Work Discharge Planning Services: Homebound not met per provider Post Acute Care Choice: [place] Living arrangements for the past 2 months: No permanent address (Lived with Sister and family)                 DME Arranged: N/A DME Agency: NA       HH Arranged: NA HH Agency: NA         Social Determinants of Health (SDOH) Interventions    Readmission Risk Interventions No flowsheet data found.

## 2020-05-26 NOTE — Progress Notes (Signed)
[facility]  ROUNDING NOTE   Subjective:   1/12: States she is working with physical therapist and is able to walk in the room.  However still describes difficulty with sitting. 1/13: No acute events.  No significant change in clinical status   Objective:  Vital signs in last 24 hours:  Temp:  [97.7 F (36.5 C)-98.5 F (36.9 C)] 98 F (36.7 C) (01/13 1119) Pulse Rate:  [70-76] 70 (01/13 1119) Resp:  [18-19] 18 (01/13 1119) BP: (121-158)/(52-75) 121/52 (01/13 1119) SpO2:  [94 %-98 %] 95 % (01/13 1119) Weight:  [83.4 kg] 83.4 kg (01/13 0310)  Weight change: 0.816 kg Filed Weights   05/24/20 0506 05/25/20 0500 05/26/20 0310  Weight: 85.5 kg 82.6 kg 83.4 kg    Intake/Output: I/O last 3 completed shifts: In: 240 [P.O.:240] Out: 1450 [Urine:1450]   Intake/Output this shift:  Total I/O In: 240 [P.O.:240] Out: 400 [Urine:400]  Physical Exam: General:  No acute distress, laying in the bed  HEENT  anicteric, moist oral mucous membrane  Pulm/lungs  normal breathing effort, lungs are clear to auscultation  CVS/Heart  regular rhythm, no rub or gallop  Abdomen:   Soft, nontender  Extremities:  + Dependent peripheral edema  Neurologic:  Alert, oriented, able to follow commands  Skin:  No acute rashes   Access: RIJ permcath 05/16/20 Dr. Lucky Cowboy    Basic Metabolic Panel: Recent Labs  Lab 05/20/20 0610 05/21/20 0409 05/22/20 0553 05/23/20 0521 05/26/20 0553  NA 143 141 140 139 139  K 5.0 3.9 4.0 4.2 4.2  CL 107 105 105 104 104  CO2 24 28 29 26 27   GLUCOSE 95 108* 112* 99 108*  BUN 59* 32* 43* 52* 43*  CREATININE 3.37* 1.92* 2.68* 2.92* 2.63*  CALCIUM 8.2* 8.0* 8.0* 8.3* 8.4*  MG 2.1 1.9  --   --   --   PHOS 6.2* 4.2  --   --   --     Liver Function Tests: No results for input(s): AST, ALT, ALKPHOS, BILITOT, PROT, ALBUMIN in the last 168 hours. No results for input(s): LIPASE, AMYLASE in the last 168 hours. No results for input(s): AMMONIA in the last 168  hours.  CBC: Recent Labs  Lab 05/20/20 0727 05/21/20 0409 05/22/20 0553 05/23/20 0521 05/26/20 0553  WBC 7.0 6.1 5.4 6.6 6.9  NEUTROABS  --   --   --   --  3.5  HGB 7.5* 8.2* 7.8* 8.0* 7.8*  HCT 24.5* 25.6* 24.5* 24.6* 24.9*  MCV 98.8 96.2 96.8 95.3 96.9  PLT 236 230 233 232 237    Cardiac Enzymes: No results for input(s): CKTOTAL, CKMB, CKMBINDEX, TROPONINI in the last 168 hours.  BNP: Invalid input(s): POCBNP  CBG: Recent Labs  Lab 05/25/20 1135 05/25/20 1626 05/25/20 2018 05/26/20 0733 05/26/20 1118  GLUCAP 121* 83 102* 86 96    Microbiology: Results for orders placed or performed during the hospital encounter of 04/30/20  Resp Panel by RT-PCR (Flu A&B, Covid) Nasopharyngeal Swab     Status: None   Collection Time: 04/30/20  4:01 PM   Specimen: Nasopharyngeal Swab; Nasopharyngeal(NP) swabs in vial transport medium  Result Value Ref Range Status   SARS Coronavirus 2 by RT PCR NEGATIVE NEGATIVE Final    Comment: (NOTE) SARS-CoV-2 target nucleic acids are NOT DETECTED.  The SARS-CoV-2 RNA is generally detectable in upper respiratory specimens during the acute phase of infection. The lowest concentration of SARS-CoV-2 viral copies this assay can detect is  138 copies/mL. A negative result does not preclude SARS-Cov-2 infection and should not be used as the sole basis for treatment or other patient management decisions. A negative result may occur with  improper specimen collection/handling, submission of specimen other than nasopharyngeal swab, presence of viral mutation(s) within the areas targeted by this assay, and inadequate number of viral copies(<138 copies/mL). A negative result must be combined with clinical observations, patient history, and epidemiological information. The expected result is Negative.  Fact Sheet for Patients:  EntrepreneurPulse.com.au  Fact Sheet for Healthcare Providers:   IncredibleEmployment.be  This test is no t yet approved or cleared by the [place] FDA and  has been authorized for detection and/or diagnosis of SARS-CoV-2 by FDA under an Emergency Use Authorization (EUA). This EUA will remain  in effect (meaning this test can be used) for the duration of the COVID-19 declaration under Section 564(b)(1) of the Act, 21 U.S.C.section 360bbb-3(b)(1), unless the authorization is terminated  or revoked sooner.       Influenza A by PCR NEGATIVE NEGATIVE Final   Influenza B by PCR NEGATIVE NEGATIVE Final    Comment: (NOTE) The Xpert Xpress SARS-CoV-2/FLU/RSV plus assay is intended as an aid in the diagnosis of influenza from Nasopharyngeal swab specimens and should not be used as a sole basis for treatment. Nasal washings and aspirates are unacceptable for Xpert Xpress SARS-CoV-2/FLU/RSV testing.  Fact Sheet for Patients: EntrepreneurPulse.com.au  Fact Sheet for Healthcare Providers: IncredibleEmployment.be  This test is not yet approved or cleared by the [place] FDA and has been authorized for detection and/or diagnosis of SARS-CoV-2 by FDA under an Emergency Use Authorization (EUA). This EUA will remain in effect (meaning this test can be used) for the duration of the COVID-19 declaration under Section 564(b)(1) of the Act, 21 U.S.C. section 360bbb-3(b)(1), unless the authorization is terminated or revoked.  Performed at [facility], [address] [place], [place] [address]   Body fluid culture     Status: None   Collection Time: 05/02/20  4:44 PM   Specimen: PATH Cytology Pleural fluid  Result Value Ref Range Status   Specimen Description   Final    PLEURAL Performed at [facility], [address] [place], [place] [address]    Special Requests   Final    NONE Performed at [facility] [address] [place], [place] [address]    Gram  Stain   Final    MODERATE WBC PRESENT, PREDOMINANTLY MONONUCLEAR NO ORGANISMS SEEN    Culture   Final    NO GROWTH Performed at [facility], [place] [address], [place], [place] [address]    Report Status 05/06/2020 FINAL  Final  MRSA PCR Screening     Status: None   Collection Time: 05/16/20  9:29 PM   Specimen: Nasal Mucosa; Nasopharyngeal  Result Value Ref Range Status   MRSA by PCR NEGATIVE NEGATIVE Final    Comment:        The GeneXpert MRSA Assay (FDA approved for NASAL specimens only), is one component of a comprehensive MRSA colonization surveillance program. It is not intended to diagnose MRSA infection nor to guide or monitor treatment for MRSA infections. Performed at [facility], [address] [place], [place] [address]     Coagulation Studies: No results for input(s): LABPROT, INR in the last 72 hours.  Urinalysis: No results for input(s): COLORURINE, LABSPEC, PHURINE, GLUCOSEU, HGBUR, BILIRUBINUR, KETONESUR, PROTEINUR, UROBILINOGEN, NITRITE, LEUKOCYTESUR in the last 72  hours.  Invalid input(s): APPERANCEUR    Imaging: No results found.   Medications:   . sodium chloride     . amLODipine  10 mg Oral Daily  . benzonatate  200 mg Oral TID  . carvedilol  12.5 mg Oral BID WC  . Chlorhexidine Gluconate Cloth  6 each Topical Q0600  . epoetin (EPOGEN/PROCRIT) injection  4,000 Units Intravenous Q M,W,F-HD  . feeding supplement  1 Container Oral TID BM  . heparin  5,000 Units Subcutaneous Q8H  . insulin aspart  0-9 Units Subcutaneous TID WC  . lidocaine  1 patch Transdermal Q24H  . losartan  75 mg Oral Daily  . melatonin  5 mg Oral QPM  . methocarbamol  500 mg Oral QID  . multivitamin  1 tablet Oral QHS  . pneumococcal 23 valent vaccine  0.5 mL Intramuscular Tomorrow-1000  . polyethylene glycol  17 g Oral Daily  . senna-docusate  1 tablet Oral BID  . sevelamer carbonate  800 mg Oral TID WC  . sodium chloride flush  3 mL Intravenous  Q12H  . topiramate  25 mg Oral BID  . torsemide  40 mg Oral Daily  . traZODone  50 mg Oral QPM  . vitamin B-12  100 mcg Oral Daily   sodium chloride, acetaminophen **OR** acetaminophen, ALPRAZolam, bisacodyl, chlorpheniramine-HYDROcodone, ondansetron **OR** ondansetron (ZOFRAN) IV, oxyCODONE-acetaminophen, promethazine, sodium chloride flush  Assessment/ Plan:  [name] is a 65 y.o. white female with diastolic congestive heart failure, hypertension, diabetes mellitus type II who is admitted to [facility] on 04/30/2020 for Chronic kidney disease (CKD), stage IV (severe) (HCC) [N18.4] Volume overload [E87.70]  Patient initiated on hemodialysis this admission.   1. End stage renal disease: Secondary to hypertension and atherosclerosis.  History of nonsteroidal use Outpatient dialysis placement in progress -accepted at [facility] MWF 11:40 shift Next hemodialysis planned for Friday. Considering dialysis twice a week, since she has good residual renal function  2. Hypertension with lower extremity edema: Variable control Currently managed with amlodipine, carvedilol, losartan Torsemide for diuresis  3. Anemia of chronic kidney disease:  Lab Results  Component Value Date   HGB 7.8 (L) 05/26/2020    SPEP/UPEP negative. EPO with HD treatments.        LOS: [place] 1/13/20223:25 PM

## 2020-05-26 NOTE — Progress Notes (Signed)
PROGRESS NOTE    Katie [name]  NIO:[id number] DOB: [birth year] DOA: 04/30/2020 PCP: Patient, [name]  Brief Narrative:  Katie [name] a 65 y.o.female with a history of diabetes mellitus type 2, stage IV CKD, heart failure, hypertension. Patient presented secondary to dyspnea with evidence of volume overload and pleural effusion.  Patient has had an extended hospital course.  Her acute medical issues have essentially resolved at this point.  She is medically stable for discharge however we do not have a safe disposition plan.  Patient does not have stable living and lives with a friend here.  She states that the friend likely will not be able to care for her.  TOC working to find placement however multiple facilities have refused at this point   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)   Hyperkalemia   Pressure injury of skin   Pleural effusion   Acute heart failure with preserved ejection fraction (HFpEF) (HCC)   Chronic kidney disease (CKD), stage IV (severe) ([place])   Pulmonary hypertension, unspecified (HCC)  Volume overload In setting of CKD. Cardiomegaly on chest x-ray.  Started on Lasix IV on admission but has now progressed to requiring hemodialysis. CKD likely progressed to ESRD Volume status has improved Plan: Nephrology following for hemodialysis PermCath placed in right chest Patient has outpatient HD chair however needs a skilled nursing facility which we do not have  Pleural effusion In setting of fluid overload with new diagnosis of grade 2 diastolic heart failure.  No previous history of pleural effusion.  Diagnostic right thoracentesis performed on 12/20.  No evidence of malignancy on cytology  Diabetes mellitus, type 2 Hemoglobin A1C of 4.7%.  No need for pharmacologic  treatment. Diet control.  ESRD Unknown baseline. Patient is unsure of her primary nephrologist in [place].  Creatinine of 2.97 on admission.  Per patient, she was planned to start hemodialysis in addition to consideration for renal transplant.  Nephrology consulted on 12/20.  Worsening renal function in setting of attempted Lasix diuresis for heart failure. Nephrology now started hemodialysis. Plan: Patient accepted at [facility] Pending financial clearance for discharge planning TOC attempting disposition to SNF If further efforts to find skilled nursing facility we are unsuccessful may be necessary to discharge home with home health.  This is not an optimal disposition however we may be out of other options   Acute diastolic heart failure New diagnosis. Grade 2 dysfunction. Normal EF.  Associated RV systolic dysfunction with elevated PA pressure seen on TTE although RHC on 12/28 significant for mildly elevated right heart/pulmonary artery pressures.  Net -14L over admission Plan: Torsemide 40 mg p.o. daily per nephrology recommendations Daily weights Strict I's and O's  Moderate pericardial effusion No clinical evidence of tamponade.  No recent infections.  In setting of CKD V with elevated BUN. No chest pain. Plan: -Cardiology recommendation: outpatient Transthoracic Echocardiogram for surveillance  Mild/moderate aortic stenosis Unsure if this is symptomatic.  Patient does have dyspnea with exertion but this may be multifactorial. -Cardiology recommendations: outpatient Transthoracic Echocardiogram for surveillance  Primary hypertension Patient is on at least hydrochlorothiazide as an outpatient but is unsure of her regimen. Improved control -Losartan, carvedilol, amlodipine -Lasix as above  Anemia Possible related to underlying kidney disease.  Evidence of macrocytosis on CBC.  TIBC  low but otherwise normal iron panel. Vitamin B12 level 358, target  > 400, started oral supplement and folate normal.  Hyperkalemia In setting of CKD. Lokelma 10 g given with improvement.  Pressure injury Mid coccyx, POA  History of tobacco use Patient has quit for at least 10 years. Previously, 20 pack year history.  Headache Normal chronic headache. Patient states she takes Imitrex as needed.  Continue Tylenol and oxycodone as needed 1/8 s/p  Fioricet 1 dose 1/9 IV Solu-Medrol 60 mg x 1 dose, Benadryl 25 mg IV x1 dose and Phenergan 25 mg IV x1 dose given  1/10 Headache improved, patient slept well it seems insomnia is contributing to headache  Insomnia, started trazodone and melatonin Continue melatonin and trazodone nightly, seems to be helping patient to sleep   Obesity Body mass index is 30.91 kg/m.   DVT prophylaxis: SQ heparin Code Status: Full Family Communication: None today Disposition Plan: Status is: Inpatient  Remains inpatient appropriate because:Unsafe d/c plan   Dispo: The patient is from: Home              Anticipated d/c is to: SNF              Anticipated d/c date is: ??              Patient currently is medically stable to d/c.  Patient is currently medically stable for discharge.  She has an outpatient hemodialysis chair arranged.  Patient is uninsured.  Attempting to find skilled nursing facility via LO G however thus far unsuccessful.       Consultants:   Nephrology  Procedures:   Vas-Cath placement  Antimicrobials:   None   Subjective: Patient seen and examined.  Stable, no complaints.  Objective: Vitals:   05/25/20 2017 05/26/20 0310 05/26/20 0734 05/26/20 1119  BP: (!) 158/63 (!) 142/63 (!) 153/70 (!) 121/52  Pulse: 70 70 72 70  Resp: 18 18 18 18   Temp: 97.8 F (36.6 C) 97.7 F (36.5 C) 98.4 F (36.9 C) 98 F (36.7 C)  TempSrc:  Oral Oral Oral  SpO2: 94% 95% 97% 95%  Weight:  83.4 kg    Height:        Intake/Output Summary (Last 24 hours) at 05/26/2020 1311 Last data  filed at 05/26/2020 1120 Gross per 24 hour  Intake 240 ml  Output 950 ml  Net -710 ml   Filed Weights   05/24/20 0506 05/25/20 0500 05/26/20 0310  Weight: 85.5 kg 82.6 kg 83.4 kg    Examination:  General exam: Appears calm and comfortable  Respiratory system: Full respiratory effort.  Lungs clear.  Room air Cardiovascular system: S1 & S2 heard, RRR. No JVD, murmurs, rubs, gallops or clicks.  Trace pedal edema Gastrointestinal system: Abdomen is nondistended, soft and nontender. No organomegaly or masses felt. Normal bowel sounds heard. Central nervous system: Alert and oriented. No focal neurological deficits. Extremities: Symmetric 5 x 5 power. Skin: No rashes, lesions or ulcers Psychiatry: Judgement and insight appear normal. Mood & affect appropriate.     Data Reviewed: I have personally reviewed following labs and imaging studies  CBC: Recent Labs  Lab 05/20/20 0727 05/21/20 0409 05/22/20 0553 05/23/20 0521 05/26/20 0553  WBC 7.0 6.1 5.4 6.6 6.9  NEUTROABS  --   --   --   --  3.5  HGB 7.5* 8.2* 7.8* 8.0* 7.8*  HCT 24.5* 25.6* 24.5* 24.6* 24.9*  MCV 98.8 96.2 96.8 95.3 96.9  PLT 236 230 233  232 676   Basic Metabolic Panel: Recent Labs  Lab 05/20/20 0610 05/21/20 0409 05/22/20 0553 05/23/20 0521 05/26/20 0553  NA 143 141 140 139 139  K 5.0 3.9 4.0 4.2 4.2  CL 107 105 105 104 104  CO2 24 28 29 26 27   GLUCOSE 95 108* 112* 99 108*  BUN 59* 32* 43* 52* 43*  CREATININE 3.37* 1.92* 2.68* 2.92* 2.63*  CALCIUM 8.2* 8.0* 8.0* 8.3* 8.4*  MG 2.1 1.9  --   --   --   PHOS 6.2* 4.2  --   --   --    GFR: Estimated Creatinine Clearance: 24.1 mL/min (A) (by C-G formula based on SCr of 2.63 mg/dL (H)). Liver Function Tests: No results for input(s): AST, ALT, ALKPHOS, BILITOT, PROT, ALBUMIN in the last 168 hours. No results for input(s): LIPASE, AMYLASE in the last 168 hours. No results for input(s): AMMONIA in the last 168 hours. Coagulation Profile: No results for  input(s): INR, PROTIME in the last 168 hours. Cardiac Enzymes: No results for input(s): CKTOTAL, CKMB, CKMBINDEX, TROPONINI in the last 168 hours. BNP (last 3 results) No results for input(s): PROBNP in the last 8760 hours. HbA1C: No results for input(s): HGBA1C in the last 72 hours. CBG: Recent Labs  Lab 05/25/20 1135 05/25/20 1626 05/25/20 2018 05/26/20 0733 05/26/20 1118  GLUCAP 121* 83 102* 86 96   Lipid Profile: No results for input(s): CHOL, HDL, LDLCALC, TRIG, CHOLHDL, LDLDIRECT in the last 72 hours. Thyroid Function Tests: No results for input(s): TSH, T4TOTAL, FREET4, T3FREE, THYROIDAB in the last 72 hours. Anemia Panel: No results for input(s): VITAMINB12, FOLATE, FERRITIN, TIBC, IRON, RETICCTPCT in the last 72 hours. Sepsis Labs: No results for input(s): PROCALCITON, LATICACIDVEN in the last 168 hours.  Recent Results (from the past 240 hour(s))  MRSA PCR Screening     Status: None   Collection Time: 05/16/20  9:29 PM   Specimen: Nasal Mucosa; Nasopharyngeal  Result Value Ref Range Status   MRSA by PCR NEGATIVE NEGATIVE Final    Comment:        The GeneXpert MRSA Assay (FDA approved for NASAL specimens only), is one component of a comprehensive MRSA colonization surveillance program. It is not intended to diagnose MRSA infection nor to guide or monitor treatment for MRSA infections. Performed at [facility], [address] [place], [place] [address]          Radiology Studies: No results found.      Scheduled Meds: . amLODipine  10 mg Oral Daily  . benzonatate  200 mg Oral TID  . carvedilol  12.5 mg Oral BID WC  . Chlorhexidine Gluconate Cloth  6 each Topical Q0600  . epoetin (EPOGEN/PROCRIT) injection  4,000 Units Intravenous Q M,W,F-HD  . feeding supplement  1 Container Oral TID BM  . heparin  5,000 Units Subcutaneous Q8H  . insulin aspart  0-9 Units Subcutaneous TID WC  . lidocaine  1 patch Transdermal Q24H  . losartan  75  mg Oral Daily  . melatonin  5 mg Oral QPM  . methocarbamol  500 mg Oral QID  . multivitamin  1 tablet Oral QHS  . pneumococcal 23 valent vaccine  0.5 mL Intramuscular Tomorrow-1000  . polyethylene glycol  17 g Oral Daily  . senna-docusate  1 tablet Oral BID  . sevelamer carbonate  800 mg Oral TID WC  . sodium chloride flush  3 mL Intravenous Q12H  . topiramate  25 mg Oral BID  .  torsemide  40 mg Oral Daily  . traZODone  50 mg Oral QPM  . vitamin B-12  100 mcg Oral Daily   Continuous Infusions: . sodium chloride       LOS: 25 days    Time spent: 15 minutes    Sidney Ace, MD [facility] Pager [phone]  If 7PM-7AM, please contact night-coverage 05/26/2020, 1:11 PM

## 2020-05-27 LAB — GLUCOSE, CAPILLARY
Glucose-Capillary: 116 mg/dL — ABNORMAL HIGH (ref 70–99)
Glucose-Capillary: 90 mg/dL (ref 70–99)
Glucose-Capillary: 143 mg/dL — ABNORMAL HIGH (ref 70–99)

## 2020-05-27 NOTE — Progress Notes (Signed)
[facility]  ROUNDING NOTE   Subjective:   1/12: States she is working with physical therapist and is able to walk in the room.  However still describes difficulty with sitting. 1/13: No acute events.  No significant change in clinical status 1/14: No significant change in clinical status.  Patient declined physical therapy because of mental stress.   Objective:  Vital signs in last 24 hours:  Temp:  [97.7 F (36.5 C)-98.5 F (36.9 C)] 97.8 F (36.6 C) (01/14 1525) Pulse Rate:  [68-92] 87 (01/14 1620) Resp:  [13-29] 29 (01/14 1620) BP: (116-143)/(56-76) 139/70 (01/14 1630) SpO2:  [94 %-95 %] 95 % (01/14 1137) Weight:  [83.4 kg] 83.4 kg (01/14 0500)  Weight change: 0.029 kg Filed Weights   05/25/20 0500 05/26/20 0310 05/27/20 0500  Weight: 82.6 kg 83.4 kg 83.4 kg    Intake/Output: I/O last 3 completed shifts: In: 28 [P.O.:960] Out: 2250 [Urine:2250]   Intake/Output this shift:  Total I/O In: -  Out: 150 [Urine:150]  Physical Exam: General:  No acute distress, laying in the bed  HEENT  anicteric, moist oral mucous membrane  Pulm/lungs  normal breathing effort, lungs are clear to auscultation  CVS/Heart  regular rhythm, no rub or gallop  Abdomen:   Soft, nontender  Extremities:  + Dependent peripheral edema  Neurologic:  Alert, oriented, able to follow commands  Skin:  No acute rashes   Access: RIJ permcath 05/16/20 Dr. Lucky Cowboy    Basic Metabolic Panel: Recent Labs  Lab 05/21/20 0409 05/22/20 0553 05/23/20 0521 05/26/20 0553  NA 141 140 139 139  K 3.9 4.0 4.2 4.2  CL 105 105 104 104  CO2 28 29 26 27   GLUCOSE 108* 112* 99 108*  BUN 32* 43* 52* 43*  CREATININE 1.92* 2.68* 2.92* 2.63*  CALCIUM 8.0* 8.0* 8.3* 8.4*  MG 1.9  --   --   --   PHOS 4.2  --   --   --     Liver Function Tests: No results for input(s): AST, ALT, ALKPHOS, BILITOT, PROT, ALBUMIN in the last 168 hours. No results for input(s): LIPASE, AMYLASE in the last 168 hours. No  results for input(s): AMMONIA in the last 168 hours.  CBC: Recent Labs  Lab 05/21/20 0409 05/22/20 0553 05/23/20 0521 05/26/20 0553  WBC 6.1 5.4 6.6 6.9  NEUTROABS  --   --   --  3.5  HGB 8.2* 7.8* 8.0* 7.8*  HCT 25.6* 24.5* 24.6* 24.9*  MCV 96.2 96.8 95.3 96.9  PLT 230 233 232 237    Cardiac Enzymes: No results for input(s): CKTOTAL, CKMB, CKMBINDEX, TROPONINI in the last 168 hours.  BNP: Invalid input(s): POCBNP  CBG: Recent Labs  Lab 05/26/20 1118 05/26/20 1631 05/26/20 2138 05/27/20 0739 05/27/20 1137  GLUCAP 96 111* 114* 116* 90    Microbiology: Results for orders placed or performed during the hospital encounter of 04/30/20  Resp Panel by RT-PCR (Flu A&B, Covid) Nasopharyngeal Swab     Status: None   Collection Time: 04/30/20  4:01 PM   Specimen: Nasopharyngeal Swab; Nasopharyngeal(NP) swabs in vial transport medium  Result Value Ref Range Status   SARS Coronavirus 2 by RT PCR NEGATIVE NEGATIVE Final    Comment: (NOTE) SARS-CoV-2 target nucleic acids are NOT DETECTED.  The SARS-CoV-2 RNA is generally detectable in upper respiratory specimens during the acute phase of infection. The lowest concentration of SARS-CoV-2 viral copies this assay can detect is 138 copies/mL. A negative result does  not preclude SARS-Cov-2 infection and should not be used as the sole basis for treatment or other patient management decisions. A negative result may occur with  improper specimen collection/handling, submission of specimen other than nasopharyngeal swab, presence of viral mutation(s) within the areas targeted by this assay, and inadequate number of viral copies(<138 copies/mL). A negative result must be combined with clinical observations, patient history, and epidemiological information. The expected result is Negative.  Fact Sheet for Patients:  EntrepreneurPulse.com.au  Fact Sheet for Healthcare Providers:   IncredibleEmployment.be  This test is no t yet approved or cleared by the [place] FDA and  has been authorized for detection and/or diagnosis of SARS-CoV-2 by FDA under an Emergency Use Authorization (EUA). This EUA will remain  in effect (meaning this test can be used) for the duration of the COVID-19 declaration under Section 564(b)(1) of the Act, 21 U.S.C.section 360bbb-3(b)(1), unless the authorization is terminated  or revoked sooner.       Influenza A by PCR NEGATIVE NEGATIVE Final   Influenza B by PCR NEGATIVE NEGATIVE Final    Comment: (NOTE) The Xpert Xpress SARS-CoV-2/FLU/RSV plus assay is intended as an aid in the diagnosis of influenza from Nasopharyngeal swab specimens and should not be used as a sole basis for treatment. Nasal washings and aspirates are unacceptable for Xpert Xpress SARS-CoV-2/FLU/RSV testing.  Fact Sheet for Patients: EntrepreneurPulse.com.au  Fact Sheet for Healthcare Providers: IncredibleEmployment.be  This test is not yet approved or cleared by the [place] FDA and has been authorized for detection and/or diagnosis of SARS-CoV-2 by FDA under an Emergency Use Authorization (EUA). This EUA will remain in effect (meaning this test can be used) for the duration of the COVID-19 declaration under Section 564(b)(1) of the Act, 21 U.S.C. section 360bbb-3(b)(1), unless the authorization is terminated or revoked.  Performed at [facility], [address] [place], [place] [address]   Body fluid culture     Status: None   Collection Time: 05/02/20  4:44 PM   Specimen: PATH Cytology Pleural fluid  Result Value Ref Range Status   Specimen Description   Final    PLEURAL Performed at [facility], [address], [place], [place] [address]    Special Requests   Final    NONE Performed at [facility], [address] [place], [place] [address]    Gram  Stain   Final    MODERATE WBC PRESENT, PREDOMINANTLY MONONUCLEAR NO ORGANISMS SEEN    Culture   Final    NO GROWTH Performed at [facility], [place] [address], [place], [place] [address]    Report Status 05/06/2020 FINAL  Final  MRSA PCR Screening     Status: None   Collection Time: 05/16/20  9:29 PM   Specimen: Nasal Mucosa; Nasopharyngeal  Result Value Ref Range Status   MRSA by PCR NEGATIVE NEGATIVE Final    Comment:        The GeneXpert MRSA Assay (FDA approved for NASAL specimens only), is one component of a comprehensive MRSA colonization surveillance program. It is not intended to diagnose MRSA infection nor to guide or monitor treatment for MRSA infections. Performed at [facility], [address] [place], [place] [address]     Coagulation Studies: No results for input(s): LABPROT, INR in the last 72 hours.  Urinalysis: No results for input(s): COLORURINE, LABSPEC, PHURINE, GLUCOSEU, HGBUR, BILIRUBINUR, KETONESUR, PROTEINUR, UROBILINOGEN, NITRITE, LEUKOCYTESUR in the last 72 hours.  Invalid input(s): APPERANCEUR  Imaging: No results found.   Medications:   . sodium chloride     . amLODipine  10 mg Oral Daily  . benzonatate  200 mg Oral TID  . carvedilol  12.5 mg Oral BID WC  . Chlorhexidine Gluconate Cloth  6 each Topical Q0600  . epoetin (EPOGEN/PROCRIT) injection  4,000 Units Intravenous Q M,W,F-HD  . feeding supplement  237 mL Oral Q24H  . heparin  5,000 Units Subcutaneous Q8H  . insulin aspart  0-9 Units Subcutaneous TID WC  . lidocaine  1 patch Transdermal Q24H  . losartan  75 mg Oral Daily  . melatonin  5 mg Oral QPM  . methocarbamol  500 mg Oral QID  . multivitamin  1 tablet Oral QHS  . polyethylene glycol  17 g Oral Daily  . senna-docusate  1 tablet Oral BID  . sevelamer carbonate  800 mg Oral TID WC  . sodium chloride flush  3 mL Intravenous Q12H  . topiramate  25 mg Oral BID  . torsemide  40 mg Oral Daily  . traZODone   50 mg Oral QPM  . vitamin B-12  100 mcg Oral Daily   sodium chloride, acetaminophen **OR** acetaminophen, ALPRAZolam, bisacodyl, chlorpheniramine-HYDROcodone, ondansetron **OR** ondansetron (ZOFRAN) IV, oxyCODONE-acetaminophen, promethazine, sodium chloride flush  Assessment/ Plan:  [name] is a 65 y.o. white female with diastolic congestive heart failure, hypertension, diabetes mellitus type II who is admitted to [facility] on 04/30/2020 for Chronic kidney disease (CKD), stage IV (severe) (HCC) [N18.4] Volume overload [E87.70]  Patient initiated on hemodialysis this admission.   1. End stage renal disease: Secondary to hypertension and atherosclerosis.  History of nonsteroidal use Outpatient dialysis placement in progress -accepted at [facility] M-F 11:40 shift Next hemodialysis planned for Friday.  Encouraged dialysis seated in chair Considering dialysis twice a week, since she has good residual renal function  2. Hypertension with lower extremity edema: Variable control Currently managed with amlodipine, carvedilol, losartan Torsemide for diuresis  3. Anemia of chronic kidney disease:  Lab Results  Component Value Date   HGB 7.8 (L) 05/26/2020    SPEP/UPEP negative. EPO with HD treatments.        LOS: [place] 1/14/20224:44 PM

## 2020-05-27 NOTE — Progress Notes (Signed)
PT Cancellation Note  Patient Details Name: Monifah Freehling MRN: [id number] DOB: Jun 24, 1955   Cancelled Treatment:    Reason Eval/Treat Not Completed: Other (comment).  Chart reviewed.  Pt resting in bed upon PT arrival.  Pt declining physical therapy d/t not feeling up to it at this time; nurse notified.  Plan for dialysis today.  Will re-attempt PT session at a later date/time as able.  Leitha Bleak, PT 05/27/20, 8:59 AM

## 2020-05-27 NOTE — Progress Notes (Signed)
Mobility Specialist - Progress Note   05/27/20 1512  Mobility  Activity Dangled on edge of bed;Ambulated in room  Level of Assistance Standby assist, set-up cues, supervision of patient - no hands on (CGA for safety)  Assistive Device Front wheel walker  Distance Ambulated (ft) 90 ft  Mobility Response Tolerated well  Mobility performed by Mobility specialist  $Mobility charge 1 Mobility    Per chart review, noted pt refused PT 2x this date. Attempted at this time. Pt agreed to session. Pt on bed pan upon arrival. NT entering room to assist w/ toilet hygiene. Pt able to get to EOB mod. Independently. While dangling EOB, noted pt's gown was soiled. Provided pt a clean gown. Pt able to doff soiled gown and don new gown mod. Independently. Pt S2S to RW w/ min. Assist for boosting. Pt ambulated 90' total in room utilizing a RW. Pt declined OOR ambulation. No LOB noted. No c/o pain or SOB. However, pt stated she felt very tired at the end of ambulation. Before and after ambulating, pt performed seated exercises: kicks x 20, and marches x 20. States "they help my legs". Overall, pt tolerated session well. Pt pleasant. Pt left laying in bed w/ alarm set. All needs placed in reach. Nurse was notified.      Devlyn Retter Mobility Specialist  05/27/20, 3:18 PM

## 2020-05-27 NOTE — TOC Progression Note (Signed)
Transition of Care [facility]) - Progression Note    Patient Details  Name: Nigeria Lasseter MRN: [id number] Date of Birth: Feb 19, 1956  Transition of Care [facility]) CM/SW Kend[place], RN Phone Number: 05/27/2020, 4:20 PM  Clinical Narrative:     Spoke with Kathrynn Ducking @ [phone] regarding potential SNF placement and LOG. Requested clinical be faxed to [phone] for review. Also wanted to know if therapy, transportation and medications were covered in LOG.    Expected Discharge Plan: Skilled Nursing Facility Barriers to Discharge: Continued Medical Work up,Homeless with medical needs  Expected Discharge Plan and Services Expected Discharge Plan: [place] In-house Referral: Clinical Social Work Discharge Planning Services: Homebound not met per provider Post Acute Care Choice: [place] Living arrangements for the past 2 months: No permanent address (Lived with Sister and family)                 DME Arranged: N/A DME Agency: NA       HH Arranged: NA HH Agency: NA         Social Determinants of Health (SDOH) Interventions    Readmission Risk Interventions No flowsheet data found.

## 2020-05-27 NOTE — Progress Notes (Signed)
PROGRESS NOTE    Katie [name]  GYI:[id number] DOB: July 13, 1955 DOA: 04/30/2020 PCP: Patient, [name]  Brief Narrative:  Katie [name] a 65 y.o.female with a history of diabetes mellitus type 2, stage IV CKD, heart failure, hypertension. Patient presented secondary to dyspnea with evidence of volume overload and pleural effusion.  Patient has had an extended hospital course.  Her acute medical issues have essentially resolved at this point.  She is medically stable for discharge however we do not have a safe disposition plan.  Patient does not have stable living and lives with a friend here.  She states that the friend likely will not be able to care for her.  TOC working to find placement however multiple facilities have refused at this point.  Seen and case management involved.  Expanding search   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)   Hyperkalemia   Pressure injury of skin   Pleural effusion   Acute heart failure with preserved ejection fraction (HFpEF) (HCC)   Chronic kidney disease (CKD), stage IV (severe) (HCC)   Pulmonary hypertension, unspecified (HCC)  Volume overload In setting of CKD. Cardiomegaly on chest x-ray.  Started on Lasix IV on admission but has now progressed to requiring hemodialysis. CKD likely progressed to ESRD Volume status has improved Plan: Nephrology following for hemodialysis PermCath placed in right chest Patient has outpatient HD chair however needs a skilled nursing facility which we do not have  Pleural effusion In setting of fluid overload with new diagnosis of grade 2 diastolic heart failure.  No previous history of pleural effusion.  Diagnostic right thoracentesis performed on 12/20.  No evidence of malignancy on cytology  Diabetes mellitus, type  2 Hemoglobin A1C of 4.7%.  No need for pharmacologic treatment. Diet control.  ESRD Unknown baseline. Patient is unsure of her primary nephrologist in [place].  Creatinine of 2.97 on admission.  Per patient, she was planned to start hemodialysis in addition to consideration for renal transplant.  Nephrology consulted on 12/20.  Worsening renal function in setting of attempted Lasix diuresis for heart failure. Nephrology now started hemodialysis. Plan: Patient accepted at [facility] Pending financial clearance for discharge planning TOC attempting disposition to SNF If further efforts to find skilled nursing facility we are unsuccessful may be necessary to discharge home with home health.  This is not an optimal disposition however we may be out of other options   Acute diastolic heart failure New diagnosis. Grade 2 dysfunction. Normal EF.  Associated RV systolic dysfunction with elevated PA pressure seen on TTE although RHC on 12/28 significant for mildly elevated right heart/pulmonary artery pressures.  Net -14L over admission Plan: Torsemide 40 mg p.o. daily per nephrology recommendations Daily weights Strict I's and O's  Moderate pericardial effusion No clinical evidence of tamponade.  No recent infections.  In setting of CKD V with elevated BUN. No chest pain. Plan: -Cardiology recommendation: outpatient Transthoracic Echocardiogram for surveillance  Mild/moderate aortic stenosis Unsure if this is symptomatic.  Patient does have dyspnea with exertion but this may be multifactorial. -Cardiology recommendations: outpatient Transthoracic Echocardiogram for surveillance  Primary hypertension Patient is on at least hydrochlorothiazide as an outpatient but is unsure of her regimen. Improved control -Losartan, carvedilol, amlodipine -Lasix as above  Anemia Possible related to underlying kidney  disease.  Evidence of macrocytosis on CBC.  TIBC low but  otherwise normal iron panel. Vitamin B12 level 358, target > 400, started oral supplement and folate normal.  Hyperkalemia In setting of CKD. Lokelma 10 g given with improvement.  Pressure injury Mid coccyx, POA  History of tobacco use Patient has quit for at least 10 years. Previously, 20 pack year history.  Headache Normal chronic headache. Patient states she takes Imitrex as needed.  Continue Tylenol and oxycodone as needed 1/8 s/p  Fioricet 1 dose 1/9 IV Solu-Medrol 60 mg x 1 dose, Benadryl 25 mg IV x1 dose and Phenergan 25 mg IV x1 dose given  1/10 Headache improved, patient slept well it seems insomnia is contributing to headache  Insomnia, started trazodone and melatonin Continue melatonin and trazodone nightly, seems to be helping patient to sleep   Obesity Body mass index is 30.91 kg/m.   DVT prophylaxis: SQ heparin Code Status: Full Family Communication: None today Disposition Plan: Status is: Inpatient  Remains inpatient appropriate because:Unsafe d/c plan   Dispo: The patient is from: Home              Anticipated d/c is to: SNF              Anticipated d/c date is: ??              Patient currently is medically stable to d/c.  Patient is currently medically stable for discharge.  She has an outpatient hemodialysis chair arranged.  Patient is uninsured.  Attempting to find skilled nursing facility via LO G however thus far unsuccessful.       Consultants:   Nephrology  Procedures:   Vas-Cath placement  Antimicrobials:   None   Subjective: Patient seen and examined.  Stable, no complaints.  Objective: Vitals:   05/27/20 0452 05/27/20 0500 05/27/20 0740 05/27/20 1137  BP: (!) 116/56  128/63 126/66  Pulse: 69  68 70  Resp: 16  18 19   Temp: 97.9 F (36.6 C)  97.7 F (36.5 C) 98.5 F (36.9 C)  TempSrc:    Oral  SpO2: 94%  94% 95%  Weight:  83.4 kg    Height:        Intake/Output Summary (Last 24 hours) at 05/27/2020  1328 Last data filed at 05/27/2020 1137 Gross per 24 hour  Intake 480 ml  Output 1450 ml  Net -970 ml   Filed Weights   05/25/20 0500 05/26/20 0310 05/27/20 0500  Weight: 82.6 kg 83.4 kg 83.4 kg    Examination:  General exam: Appears calm and comfortable  Respiratory system: Full respiratory effort.  Lungs clear.  Room air Cardiovascular system: S1 & S2 heard, RRR. No JVD, murmurs, rubs, gallops or clicks.  Trace pedal edema Gastrointestinal system: Abdomen is nondistended, soft and nontender. No organomegaly or masses felt. Normal bowel sounds heard. Central nervous system: Alert and oriented. No focal neurological deficits. Extremities: Symmetric 5 x 5 power. Skin: No rashes, lesions or ulcers Psychiatry: Judgement and insight appear normal. Mood & affect appropriate.     Data Reviewed: I have personally reviewed following labs and imaging studies  CBC: Recent Labs  Lab 05/21/20 0409 05/22/20 0553 05/23/20 0521 05/26/20 0553  WBC 6.1 5.4 6.6 6.9  NEUTROABS  --   --   --  3.5  HGB 8.2* 7.8* 8.0* 7.8*  HCT 25.6* 24.5* 24.6* 24.9*  MCV 96.2 96.8 95.3 96.9  PLT 230 233 232 638   Basic Metabolic Panel:  Recent Labs  Lab 05/21/20 0409 05/22/20 0553 05/23/20 0521 05/26/20 0553  NA 141 140 139 139  K 3.9 4.0 4.2 4.2  CL 105 105 104 104  CO2 28 29 26 27   GLUCOSE 108* 112* 99 108*  BUN 32* 43* 52* 43*  CREATININE 1.92* 2.68* 2.92* 2.63*  CALCIUM 8.0* 8.0* 8.3* 8.4*  MG 1.9  --   --   --   PHOS 4.2  --   --   --    GFR: Estimated Creatinine Clearance: 24.1 mL/min (A) (by C-G formula based on SCr of 2.63 mg/dL (H)). Liver Function Tests: No results for input(s): AST, ALT, ALKPHOS, BILITOT, PROT, ALBUMIN in the last 168 hours. No results for input(s): LIPASE, AMYLASE in the last 168 hours. No results for input(s): AMMONIA in the last 168 hours. Coagulation Profile: No results for input(s): INR, PROTIME in the last 168 hours. Cardiac Enzymes: No results for  input(s): CKTOTAL, CKMB, CKMBINDEX, TROPONINI in the last 168 hours. BNP (last 3 results) No results for input(s): PROBNP in the last 8760 hours. HbA1C: No results for input(s): HGBA1C in the last 72 hours. CBG: Recent Labs  Lab 05/26/20 1118 05/26/20 1631 05/26/20 2138 05/27/20 0739 05/27/20 1137  GLUCAP 96 111* 114* 116* 90   Lipid Profile: No results for input(s): CHOL, HDL, LDLCALC, TRIG, CHOLHDL, LDLDIRECT in the last 72 hours. Thyroid Function Tests: No results for input(s): TSH, T4TOTAL, FREET4, T3FREE, THYROIDAB in the last 72 hours. Anemia Panel: No results for input(s): VITAMINB12, FOLATE, FERRITIN, TIBC, IRON, RETICCTPCT in the last 72 hours. Sepsis Labs: No results for input(s): PROCALCITON, LATICACIDVEN in the last 168 hours.  No results found for this or any previous visit (from the past 240 hour(s)).       Radiology Studies: No results found.      Scheduled Meds: . amLODipine  10 mg Oral Daily  . benzonatate  200 mg Oral TID  . carvedilol  12.5 mg Oral BID WC  . Chlorhexidine Gluconate Cloth  6 each Topical Q0600  . epoetin (EPOGEN/PROCRIT) injection  4,000 Units Intravenous Q M,W,F-HD  . feeding supplement  237 mL Oral Q24H  . heparin  5,000 Units Subcutaneous Q8H  . insulin aspart  0-9 Units Subcutaneous TID WC  . lidocaine  1 patch Transdermal Q24H  . losartan  75 mg Oral Daily  . melatonin  5 mg Oral QPM  . methocarbamol  500 mg Oral QID  . multivitamin  1 tablet Oral QHS  . polyethylene glycol  17 g Oral Daily  . senna-docusate  1 tablet Oral BID  . sevelamer carbonate  800 mg Oral TID WC  . sodium chloride flush  3 mL Intravenous Q12H  . topiramate  25 mg Oral BID  . torsemide  40 mg Oral Daily  . traZODone  50 mg Oral QPM  . vitamin B-12  100 mcg Oral Daily   Continuous Infusions: . sodium chloride       LOS: 26 days    Time spent: 15 minutes    Sidney Ace, MD [facility] Pager [phone]  If 7PM-7AM,  please contact night-coverage 05/27/2020, 1:28 PM

## 2020-05-27 NOTE — Progress Notes (Signed)
PT Cancellation Note  Patient Details Name: Katie [name] MRN: [id number] DOB: [birth year]   Cancelled Treatment:    Reason Eval/Treat Not Completed: Other (comment).  Pt resting in bed upon PT arrival; pt declining physical therapy today d/t not feeling up to it.  Plan for dialysis today.  Will re-attempt PT session at a later date/time.  Leitha Bleak, PT 05/27/20, 12:05 PM

## 2020-05-28 LAB — GLUCOSE, CAPILLARY
Glucose-Capillary: 101 mg/dL — ABNORMAL HIGH (ref 70–99)
Glucose-Capillary: 100 mg/dL — ABNORMAL HIGH (ref 70–99)
Glucose-Capillary: 103 mg/dL — ABNORMAL HIGH (ref 70–99)
Glucose-Capillary: 125 mg/dL — ABNORMAL HIGH (ref 70–99)

## 2020-05-28 NOTE — Progress Notes (Signed)
Physical Therapy Treatment Patient Details Name: Katie [name] MRN: [id number] DOB: 11-03-1955 Today's Date: 05/28/2020    History of Present Illness Patient is a 65 y.o. female with a history of diabetes mellitus type 2, stage IV CKD, heart failure, hypertension. Patient presented secondary to dyspnea with evidence of volume overload and pleural effusion. Diagnosed with moderate pericardial effusion, mild to moderate aortic insufficiency and aortic stenosis.  S/p R heart cath 12/28.  R temp fem cath placed 12/29; 1/3 R IJ permcath placed; 1/4 R temp fem cath removed.    PT Comments    Pt received supine in bed willing complaining of HA, but willing to participate with PT interventions. Pt completed bed mobility with CGA and seated EOB with CGA. Completed sit<>stand transfer x2 pt reported lightheadedness with BP dropping to 58/44 in the standing position, returning to 85/71 in the seated position and 104/66 (RN and MD made aware). Pt able to tolerate exercises seated EOB as follows: seated marching x20, LAQs x10 BIL, ankle pumps x20 and hip ABD/ADD x10 BIL. Pt tolerated interventions well today, but refused to sit in the reclines due to reports of HA and pain.  Pt provided hot towel for head, turned on bed alarm, call bell within reach.   Follow Up Recommendations  SNF     Equipment Recommendations  Rolling walker with 5" wheels;3in1 (PT)    Recommendations for Other Services       Precautions / Restrictions Precautions Precautions: Fall Precaution Comments: R IJ permcath Restrictions Weight Bearing Restrictions: No    Mobility  Bed Mobility Overal bed mobility: Needs Assistance Bed Mobility: Supine to Sit;Sit to Supine     Supine to sit: Supervision;HOB elevated Sit to supine: Supervision;HOB elevated   General bed mobility comments: increased effort and time to perform on own  Transfers Overall transfer level: Needs assistance Equipment used: Rolling walker (2  wheeled) Transfers: Sit to/from Stand Sit to Stand: From elevated surface;Min guard Stand pivot transfers: Min guard;Min assist       General transfer comment: Pt able to complete sit<>stand transfer x2 with minA from therapist, pt requiring seated rest break due to drop in BP between each repetition  Ambulation/Gait             General Gait Details: Pt lightheaded and unable to ambulate due to low BP this morning   Stairs             Wheelchair Mobility    Modified Rankin (Stroke Patients Only)       Balance Overall balance assessment: Needs assistance Sitting-balance support: Single extremity supported;Feet supported Sitting balance-Leahy Scale: Good Sitting balance - Comments: Able to maintain seated position safely EOB while completing dynamic seated activities   Standing balance support: Bilateral upper extremity supported Standing balance-Leahy Scale: Fair Standing balance comment: Pt with drop in BP and increased lightheadedness required minA, RW and seated rest break to maintain safety                            Cognition Arousal/Alertness: Awake/alert Behavior During Therapy: WFL for tasks assessed/performed Overall Cognitive Status: Within Functional Limits for tasks assessed                                 General Comments: Pt willing to participate with PT today, but limited 2/2 lightheadedness      Exercises Total Joint Exercises  Ankle Circles/Pumps: AROM;Strengthening;Both;Seated Hip ABduction/ADduction: AROM;Both;10 reps Long Arc Quad: AROM;Strengthening;Both;10 reps Marching in Standing: AROM;Both;10 reps    General Comments        Pertinent Vitals/Pain Pain Assessment: 0-10 Pain Score: 7  Pain Location: Head and neck Pain Descriptors / Indicators: Aching;Throbbing Pain Intervention(s): Limited activity within patient's tolerance;Monitored during session;Heat applied    Home Living                       Prior Function            PT Goals (current goals can now be found in the care plan section) Acute Rehab PT Goals Patient Stated Goal: to go home PT Goal Formulation: With patient Time For Goal Achievement: 06/01/20 Potential to Achieve Goals: Fair Progress towards PT goals: Progressing toward goals    Frequency           PT Plan Current plan remains appropriate    Co-evaluation              AM-PAC PT "6 Clicks" Mobility   Outcome Measure  Help needed turning from your back to your side while in a flat bed without using bedrails?: A Little Help needed moving from lying on your back to sitting on the side of a flat bed without using bedrails?: A Little Help needed moving to and from a bed to a chair (including a wheelchair)?: A Little Help needed standing up from a chair using your arms (e.g., wheelchair or bedside chair)?: A Little Help needed to walk in hospital room?: A Lot Help needed climbing 3-5 steps with a railing? : A Lot 6 Click Score: 16    End of Session Equipment Utilized During Treatment: Gait belt Activity Tolerance: Treatment limited secondary to medical complications (Comment) (Pt with drop in BP with standing position, limited tolerance to standing) Patient left: in bed;with bed alarm set;Other (comment) Nurse Communication: Mobility status;Precautions PT Visit Diagnosis: Unsteadiness on feet (R26.81);Muscle weakness (generalized) (M62.81);History of falling (Z91.81);Other abnormalities of gait and mobility (R26.89)     Time: 6433-2951 PT Time Calculation (min) (ACUTE ONLY): 28 min  Charges:  $Therapeutic Exercise: 8-22 mins $Therapeutic Activity: 8-22 mins                     Katie [name], PT, DPT 05/28/20, 1:20 PM    Katie [name] 05/28/2020, 1:20 PM

## 2020-05-28 NOTE — Progress Notes (Signed)
PROGRESS NOTE    Katie [name]  CBS:[id number] DOB: Apr 14, 1956 DOA: 04/30/2020 PCP: Patient, [name]  Brief Narrative:  Katie [name] a 65 y.o.female with a history of diabetes mellitus type 2, stage IV CKD, heart failure, hypertension. Patient presented secondary to dyspnea with evidence of volume overload and pleural effusion.  Patient has had an extended hospital course.  Her acute medical issues have essentially resolved at this point.  She is medically stable for discharge however we do not have a safe disposition plan.  Patient does not have stable living and lives with a friend here.  She states that the friend likely will not be able to care for her.  TOC working to find placement however multiple facilities have refused at this point.  Seen and case management involved.  Expanding search   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)   Hyperkalemia   Pressure injury of skin   Pleural effusion   Acute heart failure with preserved ejection fraction (HFpEF) (HCC)   Chronic kidney disease (CKD), stage IV (severe) (HCC)   Pulmonary hypertension, unspecified (HCC)  Volume overload In setting of CKD. Cardiomegaly on chest x-ray.  Started on Lasix IV on admission but has now progressed to requiring hemodialysis. CKD likely progressed to ESRD Volume status has improved Plan: Nephrology following for hemodialysis PermCath placed in right chest Patient has outpatient hemodialysis chair however skilled nursing facility has been the barrier to discharge.  Senior Case management involved.  Multiple facilities contacted.  Disposition plan pending.  Pleural effusion In setting of fluid overload with new diagnosis of grade 2 diastolic heart failure.  No previous history of pleural effusion.  Diagnostic right  thoracentesis performed on 12/20.  No evidence of malignancy on cytology  Diabetes mellitus, type 2 Hemoglobin A1C of 4.7%.  No need for pharmacologic treatment. Diet control.  ESRD Unknown baseline. Patient is unsure of her primary nephrologist in [place].  Creatinine of 2.97 on admission.  Per patient, she was planned to start hemodialysis in addition to consideration for renal transplant.  Nephrology consulted on 12/20.  Worsening renal function in setting of attempted Lasix diuresis for heart failure. Nephrology now started hemodialysis. Plan: Patient accepted at [facility] Pending financial clearance for discharge planning TOC attempting disposition to SNF If further efforts to find skilled nursing facility we are unsuccessful may be necessary to discharge home with home health.  This is not an optimal disposition however we may be out of other options   Acute diastolic heart failure New diagnosis. Grade 2 dysfunction. Normal EF.  Associated RV systolic dysfunction with elevated PA pressure seen on TTE although RHC on 12/28 significant for mildly elevated right heart/pulmonary artery pressures.  Net -14L over admission Plan: Torsemide 40 mg p.o. daily per nephrology recommendations Daily weights Strict I's and O's  Moderate pericardial effusion No clinical evidence of tamponade.  No recent infections.  In setting of CKD V with elevated BUN. No chest pain. Plan: -Cardiology recommendation: outpatient Transthoracic Echocardiogram for surveillance  Mild/moderate aortic stenosis Unsure if this is symptomatic.  Patient does have dyspnea with exertion but this may be multifactorial. -Cardiology recommendations: outpatient Transthoracic Echocardiogram for surveillance  Primary hypertension Patient is on at least hydrochlorothiazide as an outpatient but is unsure of her regimen. Improved control -Losartan,  carvedilol, amlodipine -Lasix as  above  Anemia Possible related to underlying kidney disease.  Evidence of macrocytosis on CBC.  TIBC low but otherwise normal iron panel. Vitamin B12 level 358, target > 400, started oral supplement and folate normal.  Hyperkalemia In setting of CKD. Lokelma 10 g given with improvement.  Pressure injury Mid coccyx, POA  History of tobacco use Patient has quit for at least 10 years. Previously, 20 pack year history.  Headache Normal chronic headache. Patient states she takes Imitrex as needed.  Continue Tylenol and oxycodone as needed 1/8 s/p  Fioricet 1 dose 1/9 IV Solu-Medrol 60 mg x 1 dose, Benadryl 25 mg IV x1 dose and Phenergan 25 mg IV x1 dose given  1/10 Headache improved, patient slept well it seems insomnia is contributing to headache  Insomnia, started trazodone and melatonin Continue melatonin and trazodone nightly, seems to be helping patient to sleep   Obesity Body mass index is 30.91 kg/m.   DVT prophylaxis: SQ heparin Code Status: Full Family Communication: None today Disposition Plan: Status is: Inpatient  Remains inpatient appropriate because:Unsafe d/c plan   Dispo: The patient is from: Home              Anticipated d/c is to: SNF              Anticipated d/c date is: ??              Patient currently is medically stable to d/c.  Patient currently medically stable for discharge.  Outpatient hemodialysis chair has been arranged.  Skilled nursing facility placement has been the barrier to discharge.  Continue case management involved.  Disposition plan pending.       Consultants:   Nephrology  Procedures:   Vas-Cath placement  Antimicrobials:   None   Subjective: Patient seen and examined.  Evaluated by physical therapy today.  Had some orthostasis and dizziness.  Still refusing to sit in the chair due to back and buttock pain.  Objective: Vitals:   05/28/20 0402 05/28/20 0815 05/28/20 0937 05/28/20 1300  BP: (!) 144/63  (!) 144/66 (!) 154/69   Pulse: 75 74 77 74  Resp: 18 18    Temp: 99.1 F (37.3 C) 98.6 F (37 C)    TempSrc: Oral Oral    SpO2: 95% 95%  98%  Weight: 78.7 kg     Height:        Intake/Output Summary (Last 24 hours) at 05/28/2020 1312 Last data filed at 05/28/2020 0900 Gross per 24 hour  Intake 120 ml  Output 350 ml  Net -230 ml   Filed Weights   05/26/20 0310 05/27/20 0500 05/28/20 0402  Weight: 83.4 kg 83.4 kg 78.7 kg    Examination:  General exam: Appears calm and comfortable  Respiratory system: Full respiratory effort.  Lungs clear.  Room air Cardiovascular system: S1 & S2 heard, RRR. No JVD, murmurs, rubs, gallops or clicks.  Trace pedal edema Gastrointestinal system: Abdomen is nondistended, soft and nontender. No organomegaly or masses felt. Normal bowel sounds heard. Central nervous system: Alert and oriented. No focal neurological deficits. Extremities: Symmetric 5 x 5 power. Skin: No rashes, lesions or ulcers Psychiatry: Judgement and insight appear normal. Mood & affect appropriate.     Data Reviewed: I have personally reviewed following labs and imaging studies  CBC: Recent Labs  Lab 05/22/20 0553 05/23/20 0521 05/26/20 0553  WBC 5.4 6.6 6.9  NEUTROABS  --   --  3.5  HGB  7.8* 8.0* 7.8*  HCT 24.5* 24.6* 24.9*  MCV 96.8 95.3 96.9  PLT 233 232 010   Basic Metabolic Panel: Recent Labs  Lab 05/22/20 0553 05/23/20 0521 05/26/20 0553  NA 140 139 139  K 4.0 4.2 4.2  CL 105 104 104  CO2 29 26 27   GLUCOSE 112* 99 108*  BUN 43* 52* 43*  CREATININE 2.68* 2.92* 2.63*  CALCIUM 8.0* 8.3* 8.4*   GFR: Estimated Creatinine Clearance: 23.5 mL/min (A) (by C-G formula based on SCr of 2.63 mg/dL (H)). Liver Function Tests: No results for input(s): AST, ALT, ALKPHOS, BILITOT, PROT, ALBUMIN in the last 168 hours. No results for input(s): LIPASE, AMYLASE in the last 168 hours. No results for input(s): AMMONIA in the last 168 hours. Coagulation Profile: No  results for input(s): INR, PROTIME in the last 168 hours. Cardiac Enzymes: No results for input(s): CKTOTAL, CKMB, CKMBINDEX, TROPONINI in the last 168 hours. BNP (last 3 results) No results for input(s): PROBNP in the last 8760 hours. HbA1C: No results for input(s): HGBA1C in the last 72 hours. CBG: Recent Labs  Lab 05/27/20 0739 05/27/20 1137 05/27/20 2106 05/28/20 0937 05/28/20 1302  GLUCAP 116* 90 143* 101* 100*   Lipid Profile: No results for input(s): CHOL, HDL, LDLCALC, TRIG, CHOLHDL, LDLDIRECT in the last 72 hours. Thyroid Function Tests: No results for input(s): TSH, T4TOTAL, FREET4, T3FREE, THYROIDAB in the last 72 hours. Anemia Panel: No results for input(s): VITAMINB12, FOLATE, FERRITIN, TIBC, IRON, RETICCTPCT in the last 72 hours. Sepsis Labs: No results for input(s): PROCALCITON, LATICACIDVEN in the last 168 hours.  No results found for this or any previous visit (from the past 240 hour(s)).       Radiology Studies: No results found.      Scheduled Meds: . amLODipine  10 mg Oral Daily  . benzonatate  200 mg Oral TID  . carvedilol  12.5 mg Oral BID WC  . Chlorhexidine Gluconate Cloth  6 each Topical Q0600  . epoetin (EPOGEN/PROCRIT) injection  4,000 Units Intravenous Q M,W,F-HD  . feeding supplement  237 mL Oral Q24H  . heparin  5,000 Units Subcutaneous Q8H  . insulin aspart  0-9 Units Subcutaneous TID WC  . lidocaine  1 patch Transdermal Q24H  . losartan  75 mg Oral Daily  . melatonin  5 mg Oral QPM  . methocarbamol  500 mg Oral QID  . multivitamin  1 tablet Oral QHS  . polyethylene glycol  17 g Oral Daily  . senna-docusate  1 tablet Oral BID  . sevelamer carbonate  800 mg Oral TID WC  . sodium chloride flush  3 mL Intravenous Q12H  . topiramate  25 mg Oral BID  . torsemide  40 mg Oral Daily  . traZODone  50 mg Oral QPM  . vitamin B-12  100 mcg Oral Daily   Continuous Infusions: . sodium chloride       LOS: 27 days    Time spent: 15  minutes    Sidney Ace, MD [facility] Pager [phone]  If 7PM-7AM, please contact night-coverage 05/28/2020, 1:12 PM

## 2020-05-28 NOTE — Progress Notes (Signed)
[facility]  ROUNDING NOTE   Subjective:   Patient resting in bed, in no acute distress.  She denies anorexia, nausea and vomiting, reports dyspnea on exertion. She received dialysis treatment yesterday, planning for next dialysis treatment on Monday.  Outpatient dialysis arrangements in process.   Objective:  Vital signs in last 24 hours:  Temp:  [97.6 F (36.4 C)-99.1 F (37.3 C)] 98 F (36.7 C) (01/15 1347) Pulse Rate:  [71-103] 75 (01/15 1347) Resp:  [11-39] 18 (01/15 1347) BP: (85-159)/(62-76) 144/70 (01/15 1347) SpO2:  [95 %-98 %] 98 % (01/15 1347) Weight:  [78.7 kg] 78.7 kg (01/15 0402)  Weight change: -4.656 kg Filed Weights   05/26/20 0310 05/27/20 0500 05/28/20 0402  Weight: 83.4 kg 83.4 kg 78.7 kg    Intake/Output: I/O last 3 completed shifts: In: -  Out: 1400 [Urine:1400]   Intake/Output this shift:  Total I/O In: 120 [P.O.:120] Out: -   Physical Exam: General:  Sitting up in bed, in no acute distress  HEENT  normocephalic, atraumatic  Pulm/lungs  respirations symmetrical, nonlabored, lungs clear  CVS/Heart  S1-S2, no rubs or gallops  Abdomen:   Soft, nontender, nondistended  Extremities:  Trace peripheral edema  Neurologic:  Oriented, speech clear and appropriate  Skin:  No acute rashes, or lesions   Access: RIJ permcath 05/16/20 Dr. Lucky Cowboy    Basic Metabolic Panel: Recent Labs  Lab 05/22/20 0553 05/23/20 0521 05/26/20 0553  NA 140 139 139  K 4.0 4.2 4.2  CL 105 104 104  CO2 29 26 27   GLUCOSE 112* 99 108*  BUN 43* 52* 43*  CREATININE 2.68* 2.92* 2.63*  CALCIUM 8.0* 8.3* 8.4*    Liver Function Tests: No results for input(s): AST, ALT, ALKPHOS, BILITOT, PROT, ALBUMIN in the last 168 hours. No results for input(s): LIPASE, AMYLASE in the last 168 hours. No results for input(s): AMMONIA in the last 168 hours.  CBC: Recent Labs  Lab 05/22/20 0553 05/23/20 0521 05/26/20 0553  WBC 5.4 6.6 6.9  NEUTROABS  --   --  3.5  HGB  7.8* 8.0* 7.8*  HCT 24.5* 24.6* 24.9*  MCV 96.8 95.3 96.9  PLT 233 232 237    Cardiac Enzymes: No results for input(s): CKTOTAL, CKMB, CKMBINDEX, TROPONINI in the last 168 hours.  BNP: Invalid input(s): POCBNP  CBG: Recent Labs  Lab 05/27/20 0739 05/27/20 1137 05/27/20 2106 05/28/20 0937 05/28/20 1302  GLUCAP 116* 90 143* 101* 100*    Microbiology: Results for orders placed or performed during the hospital encounter of 04/30/20  Resp Panel by RT-PCR (Flu A&B, Covid) Nasopharyngeal Swab     Status: None   Collection Time: 04/30/20  4:01 PM   Specimen: Nasopharyngeal Swab; Nasopharyngeal(NP) swabs in vial transport medium  Result Value Ref Range Status   SARS Coronavirus 2 by RT PCR NEGATIVE NEGATIVE Final    Comment: (NOTE) SARS-CoV-2 target nucleic acids are NOT DETECTED.  The SARS-CoV-2 RNA is generally detectable in upper respiratory specimens during the acute phase of infection. The lowest concentration of SARS-CoV-2 viral copies this assay can detect is 138 copies/mL. A negative result does not preclude SARS-Cov-2 infection and should not be used as the sole basis for treatment or other patient management decisions. A negative result may occur with  improper specimen collection/handling, submission of specimen other than nasopharyngeal swab, presence of viral mutation(s) within the areas targeted by this assay, and inadequate number of viral copies(<138 copies/mL). A negative result must be combined with  clinical observations, patient history, and epidemiological information. The expected result is Negative.  Fact Sheet for Patients:  EntrepreneurPulse.com.au  Fact Sheet for Healthcare Providers:  IncredibleEmployment.be  This test is no t yet approved or cleared by the [place] FDA and  has been authorized for detection and/or diagnosis of SARS-CoV-2 by FDA under an Emergency Use Authorization (EUA). This EUA will  remain  in effect (meaning this test can be used) for the duration of the COVID-19 declaration under Section 564(b)(1) of the Act, 21 U.S.C.section 360bbb-3(b)(1), unless the authorization is terminated  or revoked sooner.       Influenza A by PCR NEGATIVE NEGATIVE Final   Influenza B by PCR NEGATIVE NEGATIVE Final    Comment: (NOTE) The Xpert Xpress SARS-CoV-2/FLU/RSV plus assay is intended as an aid in the diagnosis of influenza from Nasopharyngeal swab specimens and should not be used as a sole basis for treatment. Nasal washings and aspirates are unacceptable for Xpert Xpress SARS-CoV-2/FLU/RSV testing.  Fact Sheet for Patients: EntrepreneurPulse.com.au  Fact Sheet for Healthcare Providers: IncredibleEmployment.be  This test is not yet approved or cleared by the [place] FDA and has been authorized for detection and/or diagnosis of SARS-CoV-2 by FDA under an Emergency Use Authorization (EUA). This EUA will remain in effect (meaning this test can be used) for the duration of the COVID-19 declaration under Section 564(b)(1) of the Act, 21 U.S.C. section 360bbb-3(b)(1), unless the authorization is terminated or revoked.  Performed at [facility], [address] [place], [place] [address]   Body fluid culture     Status: None   Collection Time: 05/02/20  4:44 PM   Specimen: PATH Cytology Pleural fluid  Result Value Ref Range Status   Specimen Description   Final    PLEURAL Performed at [facility], [address], [place], [place] [address]    Special Requests   Final    NONE Performed at [facility], [address] [place], [place] [address]    Gram Stain   Final    MODERATE WBC PRESENT, PREDOMINANTLY MONONUCLEAR NO ORGANISMS SEEN    Culture   Final    NO GROWTH Performed at [facility], [place] [address] [place], [place] [address]    Report Status 05/06/2020 FINAL  Final  MRSA PCR  Screening     Status: None   Collection Time: 05/16/20  9:29 PM   Specimen: Nasal Mucosa; Nasopharyngeal  Result Value Ref Range Status   MRSA by PCR NEGATIVE NEGATIVE Final    Comment:        The GeneXpert MRSA Assay (FDA approved for NASAL specimens only), is one component of a comprehensive MRSA colonization surveillance program. It is not intended to diagnose MRSA infection nor to guide or monitor treatment for MRSA infections. Performed at [facility], [address] [place], [place] [address]     Coagulation Studies: No results for input(s): LABPROT, INR in the last 72 hours.  Urinalysis: No results for input(s): COLORURINE, LABSPEC, PHURINE, GLUCOSEU, HGBUR, BILIRUBINUR, KETONESUR, PROTEINUR, UROBILINOGEN, NITRITE, LEUKOCYTESUR in the last 72 hours.  Invalid input(s): APPERANCEUR    Imaging: No results found.   Medications:   . sodium chloride     . amLODipine  10 mg Oral Daily  . benzonatate  200 mg Oral TID  . carvedilol  12.5 mg Oral BID WC  . Chlorhexidine Gluconate Cloth  6 each Topical Q0600  . epoetin (EPOGEN/PROCRIT) injection  4,000 Units Intravenous Q  M,W,F-HD  . feeding supplement  237 mL Oral Q24H  . heparin  5,000 Units Subcutaneous Q8H  . insulin aspart  0-9 Units Subcutaneous TID WC  . lidocaine  1 patch Transdermal Q24H  . losartan  75 mg Oral Daily  . melatonin  5 mg Oral QPM  . methocarbamol  500 mg Oral QID  . multivitamin  1 tablet Oral QHS  . polyethylene glycol  17 g Oral Daily  . senna-docusate  1 tablet Oral BID  . sevelamer carbonate  800 mg Oral TID WC  . sodium chloride flush  3 mL Intravenous Q12H  . topiramate  25 mg Oral BID  . torsemide  40 mg Oral Daily  . traZODone  50 mg Oral QPM  . vitamin B-12  100 mcg Oral Daily   sodium chloride, acetaminophen **OR** acetaminophen, ALPRAZolam, bisacodyl, chlorpheniramine-HYDROcodone, ondansetron **OR** ondansetron (ZOFRAN) IV, oxyCODONE-acetaminophen, promethazine,  sodium chloride flush  Assessment/ Plan:  [name] is a 65 y.o. white female with diastolic congestive heart failure, hypertension, diabetes mellitus type II who is admitted to [facility] on 04/30/2020 for Chronic kidney disease (CKD), stage IV (severe) (HCC) [N18.4] Volume overload [E87.70]  Patient initiated on hemodialysis this admission.   1. End stage renal disease requiring dialysis Mondays and Fridays  Secondary to hypertension and atherosclerosis.   History of nonsteroidal use  Patient received dialysis treatment yesterday Volume and electrolyte status acceptable No additional dialysis treatment required today Planning to continue dialysis treatment on Mondays and Fridays Outpatient dialysis arrangements in process  2. Hypertension with lower extremity edema:  Current antihypertensive regimen include amlodipine, losartan, carvedilol and torsemide Peripheral edema improving  3. Anemia of chronic kidney disease:  Lab Results  Component Value Date   HGB 7.8 (L) 05/26/2020  Continue Epogen 4000 units with dialysis treatments  4. secondary hyperparathyroidism Lab Results  Component Value Date   PTH 70 (H) 05/12/2020   CALCIUM 8.4 (L) 05/26/2020   PHOS 4.2 05/21/2020  Patient is on sevelamer   5.  Diabetes type 2 with CKD Patient is on insulin aspart    LOS: [place] 1/15/20223:31 PM

## 2020-05-28 NOTE — Progress Notes (Signed)
Mobility Specialist - Progress Note   05/28/20 1523  Mobility  Activity Refused mobility  Mobility performed by Mobility specialist    Pt received laying in bed. Pt politely declined session this date. States she had just taken a nap. Pt having "7/10" headache. States her head "feels like it's throbbing". BP measured: 127/63. Pt explains she becoming anxious w/ activity since last PT session. States she has hx of "passing out". Furthermore, noted pt soiled her linens d/t poor placement of purewick. Nurse was notified. Will hold and re-attempt at a later date.     Sinda Leedom Mobility Specialist  05/28/20, 3:29 PM

## 2020-05-29 LAB — GLUCOSE, CAPILLARY
Glucose-Capillary: 82 mg/dL (ref 70–99)
Glucose-Capillary: 82 mg/dL (ref 70–99)
Glucose-Capillary: 91 mg/dL (ref 70–99)

## 2020-05-29 NOTE — Plan of Care (Signed)
  Problem: Clinical Measurements: Goal: Respiratory complications will improve Outcome: Progressing Goal: Cardiovascular complication will be avoided Outcome: Progressing   Problem: Nutrition: Goal: Adequate nutrition will be maintained Outcome: Progressing   Problem: Elimination: Goal: Will not experience complications related to bowel motility Outcome: Progressing   Problem: Safety: Goal: Ability to remain free from injury will improve Outcome: Progressing   Problem: Activity: Goal: Capacity to carry out activities will improve Outcome: Not Progressing   Problem: Health Behavior/Discharge Planning: Goal: Ability to manage health-related needs will improve Outcome: Not Progressing

## 2020-05-29 NOTE — Progress Notes (Signed)
[facility]  ROUNDING NOTE   Subjective:   Patient feeling a bit better today.  Due for hemodialysis treatment again tomorrow.   Objective:  Vital signs in last 24 hours:  Temp:  [98 F (36.7 C)-98.8 F (37.1 C)] 98.8 F (37.1 C) (01/16 1208) Pulse Rate:  [71-78] 71 (01/16 1208) Resp:  [17-18] 18 (01/16 1208) BP: (115-147)/(54-72) 140/54 (01/16 1208) SpO2:  [93 %-98 %] 93 % (01/16 1208) Weight:  [80.4 kg] 80.4 kg (01/16 0302)  Weight change: 1.678 kg Filed Weights   05/27/20 0500 05/28/20 0402 05/29/20 0302  Weight: 83.4 kg 78.7 kg 80.4 kg    Intake/Output: I/O last 3 completed shifts: In: 360 [P.O.:360] Out: 350 [Urine:350]   Intake/Output this shift:  Total I/O In: 120 [P.O.:120] Out: -   Physical Exam: General:  Laying in bed, no acute distress  HEENT  normocephalic, atraumatic  Pulm/lungs  respirations symmetrical, nonlabored, lungs clear  CVS/Heart  S1-S2, no rubs or gallops  Abdomen:   Soft, nontender, nondistended  Extremities:  Trace peripheral edema  Neurologic:  Oriented, speech clear and appropriate  Skin:  No acute rashes, or lesions   Access: RIJ permcath 05/16/20 Dr. Lucky Cowboy    Basic Metabolic Panel: Recent Labs  Lab 05/23/20 0521 05/26/20 0553  NA 139 139  K 4.2 4.2  CL 104 104  CO2 26 27  GLUCOSE 99 108*  BUN 52* 43*  CREATININE 2.92* 2.63*  CALCIUM 8.3* 8.4*    Liver Function Tests: No results for input(s): AST, ALT, ALKPHOS, BILITOT, PROT, ALBUMIN in the last 168 hours. No results for input(s): LIPASE, AMYLASE in the last 168 hours. No results for input(s): AMMONIA in the last 168 hours.  CBC: Recent Labs  Lab 05/23/20 0521 05/26/20 0553  WBC 6.6 6.9  NEUTROABS  --  3.5  HGB 8.0* 7.8*  HCT 24.6* 24.9*  MCV 95.3 96.9  PLT 232 237    Cardiac Enzymes: No results for input(s): CKTOTAL, CKMB, CKMBINDEX, TROPONINI in the last 168 hours.  BNP: Invalid input(s): POCBNP  CBG: Recent Labs  Lab 05/28/20 1302  05/28/20 1721 05/28/20 2136 05/29/20 0804 05/29/20 1209  GLUCAP 100* 103* 125* 82 82    Microbiology: Results for orders placed or performed during the hospital encounter of 04/30/20  Resp Panel by RT-PCR (Flu A&B, Covid) Nasopharyngeal Swab     Status: None   Collection Time: 04/30/20  4:01 PM   Specimen: Nasopharyngeal Swab; Nasopharyngeal(NP) swabs in vial transport medium  Result Value Ref Range Status   SARS Coronavirus 2 by RT PCR NEGATIVE NEGATIVE Final    Comment: (NOTE) SARS-CoV-2 target nucleic acids are NOT DETECTED.  The SARS-CoV-2 RNA is generally detectable in upper respiratory specimens during the acute phase of infection. The lowest concentration of SARS-CoV-2 viral copies this assay can detect is 138 copies/mL. A negative result does not preclude SARS-Cov-2 infection and should not be used as the sole basis for treatment or other patient management decisions. A negative result may occur with  improper specimen collection/handling, submission of specimen other than nasopharyngeal swab, presence of viral mutation(s) within the areas targeted by this assay, and inadequate number of viral copies(<138 copies/mL). A negative result must be combined with clinical observations, patient history, and epidemiological information. The expected result is Negative.  Fact Sheet for Patients:  EntrepreneurPulse.com.au  Fact Sheet for Healthcare Providers:  IncredibleEmployment.be  This test is no t yet approved or cleared by the [place] FDA and  has been  authorized for detection and/or diagnosis of SARS-CoV-2 by FDA under an Emergency Use Authorization (EUA). This EUA will remain  in effect (meaning this test can be used) for the duration of the COVID-19 declaration under Section 564(b)(1) of the Act, 21 U.S.C.section 360bbb-3(b)(1), unless the authorization is terminated  or revoked sooner.       Influenza A by PCR NEGATIVE  NEGATIVE Final   Influenza B by PCR NEGATIVE NEGATIVE Final    Comment: (NOTE) The Xpert Xpress SARS-CoV-2/FLU/RSV plus assay is intended as an aid in the diagnosis of influenza from Nasopharyngeal swab specimens and should not be used as a sole basis for treatment. Nasal washings and aspirates are unacceptable for Xpert Xpress SARS-CoV-2/FLU/RSV testing.  Fact Sheet for Patients: EntrepreneurPulse.com.au  Fact Sheet for Healthcare Providers: IncredibleEmployment.be  This test is not yet approved or cleared by the [place] FDA and has been authorized for detection and/or diagnosis of SARS-CoV-2 by FDA under an Emergency Use Authorization (EUA). This EUA will remain in effect (meaning this test can be used) for the duration of the COVID-19 declaration under Section 564(b)(1) of the Act, 21 U.S.C. section 360bbb-3(b)(1), unless the authorization is terminated or revoked.  Performed at [facility], [address] [place], [place] [address]   Body fluid culture     Status: None   Collection Time: 05/02/20  4:44 PM   Specimen: PATH Cytology Pleural fluid  Result Value Ref Range Status   Specimen Description   Final    PLEURAL Performed at [facility], [address], [place], [place] [address]    Special Requests   Final    NONE Performed at [facility], [address] [place], [place] [address]    Gram Stain   Final    MODERATE WBC PRESENT, PREDOMINANTLY MONONUCLEAR NO ORGANISMS SEEN    Culture   Final    NO GROWTH Performed at [facility], [place] [address] [place], [place] [address]    Report Status 05/06/2020 FINAL  Final  MRSA PCR Screening     Status: None   Collection Time: 05/16/20  9:29 PM   Specimen: Nasal Mucosa; Nasopharyngeal  Result Value Ref Range Status   MRSA by PCR NEGATIVE NEGATIVE Final    Comment:        The GeneXpert MRSA Assay (FDA approved for NASAL  specimens only), is one component of a comprehensive MRSA colonization surveillance program. It is not intended to diagnose MRSA infection nor to guide or monitor treatment for MRSA infections. Performed at [facility], [address] [place], [place] [address]     Coagulation Studies: No results for input(s): LABPROT, INR in the last 72 hours.  Urinalysis: No results for input(s): COLORURINE, LABSPEC, PHURINE, GLUCOSEU, HGBUR, BILIRUBINUR, KETONESUR, PROTEINUR, UROBILINOGEN, NITRITE, LEUKOCYTESUR in the last 72 hours.  Invalid input(s): APPERANCEUR    Imaging: No results found.   Medications:   . sodium chloride     . amLODipine  10 mg Oral Daily  . benzonatate  200 mg Oral TID  . carvedilol  12.5 mg Oral BID WC  . Chlorhexidine Gluconate Cloth  6 each Topical Q0600  . epoetin (EPOGEN/PROCRIT) injection  4,000 Units Intravenous Q M,W,F-HD  . feeding supplement  237 mL Oral Q24H  . heparin  5,000 Units Subcutaneous Q8H  . insulin aspart  0-9 Units Subcutaneous TID WC  . lidocaine  1 patch Transdermal Q24H  . losartan  75 mg Oral Daily  .  melatonin  5 mg Oral QPM  . methocarbamol  500 mg Oral QID  . multivitamin  1 tablet Oral QHS  . polyethylene glycol  17 g Oral Daily  . senna-docusate  1 tablet Oral BID  . sevelamer carbonate  800 mg Oral TID WC  . sodium chloride flush  3 mL Intravenous Q12H  . topiramate  25 mg Oral BID  . torsemide  40 mg Oral Daily  . traZODone  50 mg Oral QPM  . vitamin B-12  100 mcg Oral Daily   sodium chloride, acetaminophen **OR** acetaminophen, ALPRAZolam, bisacodyl, chlorpheniramine-HYDROcodone, ondansetron **OR** ondansetron (ZOFRAN) IV, oxyCODONE-acetaminophen, promethazine, sodium chloride flush  Assessment/ Plan:  [name] is a 65 y.o. white female with diastolic congestive heart failure, hypertension, diabetes mellitus type II who is admitted to [facility] on 04/30/2020 for Chronic kidney disease (CKD), stage IV  (severe) (HCC) [N18.4] Volume overload [E87.70]  Patient initiated on hemodialysis this admission.   1. End stage renal disease requiring dialysis Mondays and Fridays  Secondary to hypertension and atherosclerosis.   History of nonsteroidal use  -No immediate need for dialysis treatment today.  We will plan for hemodialysis treatment again tomorrow.  2. Hypertension with lower extremity edema:  Blood pressure currently 140/54.  Continue amlodipine, carvedilol, losartan, and torsemide.  3. Anemia of chronic kidney disease:  Lab Results  Component Value Date   HGB 7.8 (L) 05/26/2020  Continue Epogen 4000 units with dialysis treatments  4. Secondary hyperparathyroidism Lab Results  Component Value Date   PTH 70 (H) 05/12/2020   CALCIUM 8.4 (L) 05/26/2020   PHOS 4.2 05/21/2020  Patient is on sevelamer, phosphorus at target at 4.2.  Continue periodically monitor.   5.  Diabetes type 2 with CKD Patient is on insulin aspart, glycemic control per hospitalist.    LOS: 28 Lorance Pickeral 1/16/20223:57 PM

## 2020-05-29 NOTE — Progress Notes (Addendum)
PROGRESS NOTE    Katie [name]  OVF:[id number] DOB: 06-11-1955 DOA: 04/30/2020 PCP: Patient, [name]  Brief Narrative:  Katie [name] a 65 y.o.female with a history of diabetes mellitus type 2, stage IV CKD, heart failure, hypertension. Patient presented secondary to dyspnea with evidence of volume overload and pleural effusion.  Patient has had an extended hospital course.  Her acute medical issues have essentially resolved at this point.  She is medically stable for discharge however we do not have a safe disposition plan.  Patient does not have stable living and lives with a friend here.  She states that the friend likely will not be able to care for her.  TOC working to find placement however multiple facilities have refused at this point.  Senior case management involved.  Expanding search   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)   Hyperkalemia   Pressure injury of skin   Pleural effusion   Acute heart failure with preserved ejection fraction (HFpEF) (HCC)   Chronic kidney disease (CKD), stage IV (severe) (HCC)   Pulmonary hypertension, unspecified (HCC)  Volume overload In setting of CKD. Cardiomegaly on chest x-ray.  Started on Lasix IV on admission but has now progressed to requiring hemodialysis. CKD likely progressed to ESRD Volume status has improved Plan: Nephrology following for hemodialysis PermCath placed in right chest Patient has outpatient hemodialysis chair however skilled nursing facility has been the barrier to discharge.  Senior Case management involved.  Multiple facilities contacted.  Disposition plan pending.  Pleural effusion In setting of fluid overload with new diagnosis of grade 2 diastolic heart failure.  No previous history of pleural effusion.  Diagnostic right  thoracentesis performed on 12/20.  No evidence of malignancy on cytology  Diabetes mellitus, type 2 Hemoglobin A1C of 4.7%.  No need for pharmacologic treatment. Diet control.  ESRD Unknown baseline. Patient is unsure of her primary nephrologist in [place].  Creatinine of 2.97 on admission.  Per patient, she was planned to start hemodialysis in addition to consideration for renal transplant.  Nephrology consulted on 12/20.  Worsening renal function in setting of attempted Lasix diuresis for heart failure. Nephrology now started hemodialysis. Plan: Patient accepted at [facility] Pending financial clearance for discharge planning TOC attempting disposition to SNF Next dialysis Tuesday, 05/31/2020 Nephrology following    Acute diastolic heart failure New diagnosis. Grade 2 dysfunction. Normal EF.  Associated RV systolic dysfunction with elevated PA pressure seen on TTE although RHC on 12/28 significant for mildly elevated right heart/pulmonary artery pressures.  Net -14L over admission Plan: Torsemide 40 mg p.o. daily per nephrology recommendations Daily weights Strict I's and O's  Moderate pericardial effusion No clinical evidence of tamponade.  No recent infections.  In setting of CKD V with elevated BUN. No chest pain. Plan: -Cardiology recommendation: outpatient Transthoracic Echocardiogram for surveillance  Mild/moderate aortic stenosis Unsure if this is symptomatic.  Patient does have dyspnea with exertion but this may be multifactorial. -Cardiology recommendations: outpatient Transthoracic Echocardiogram for surveillance  Primary hypertension Patient is on at least hydrochlorothiazide as an outpatient but is unsure of her regimen. Improved control -Losartan, carvedilol, amlodipine -Lasix as above  Anemia Possible related to underlying kidney disease.  Evidence of macrocytosis on CBC.  TIBC low but otherwise normal iron panel. Vitamin B12  level 358, target > 400, started oral supplement and folate normal.  Hyperkalemia In setting of CKD. Lokelma 10 g given with improvement.  Pressure injury Mid coccyx, POA  History of tobacco use Patient has quit for at least 10 years. Previously, 20 pack year history.  Headache Normal chronic headache. Patient states she takes Imitrex as needed.  Continue Tylenol and oxycodone as needed 1/8 s/p  Fioricet 1 dose 1/9 IV Solu-Medrol 60 mg x 1 dose, Benadryl 25 mg IV x1 dose and Phenergan 25 mg IV x1 dose given  1/10 Headache improved, patient slept well it seems insomnia is contributing to headache  Insomnia, started trazodone and melatonin Continue melatonin and trazodone nightly, seems to be helping patient to sleep   Obesity Body mass index is 30.91 kg/m.   DVT prophylaxis: SQ heparin Code Status: Full Family Communication: None today Disposition Plan: Status is: Inpatient  Remains inpatient appropriate because:Unsafe d/c plan   Dispo: The patient is from: Home              Anticipated d/c is to: SNF              Anticipated d/c date is: ??              Patient currently is medically stable to d/c.  Patient currently medically stable for discharge.  Outpatient hemodialysis chair has been arranged.  Skilled nursing facility placement has been the barrier to discharge.  Continue case management involved.  Disposition plan pending.       Consultants:   Nephrology  Procedures:   Vas-Cath placement  Antimicrobials:   None   Subjective: Patient seen and examined.  Evaluated by physical therapy today.  Had some orthostasis and dizziness.  Still refusing to sit in the chair due to back and buttock pain.  Objective: Vitals:   05/29/20 0302 05/29/20 0458 05/29/20 0807 05/29/20 1208  BP:  (!) 143/67 (!) 147/72 (!) 140/54  Pulse:  71 72 71  Resp:  17 18 18   Temp:  98 F (36.7 C) 98.7 F (37.1 C) 98.8 F (37.1 C)  TempSrc:   Oral Oral  SpO2:  96%  98% 93%  Weight: 80.4 kg     Height:        Intake/Output Summary (Last 24 hours) at 05/29/2020 1331 Last data filed at 05/28/2020 1855 Gross per 24 hour  Intake 240 ml  Output --  Net 240 ml   Filed Weights   05/27/20 0500 05/28/20 0402 05/29/20 0302  Weight: 83.4 kg 78.7 kg 80.4 kg    Examination:  General exam: Appears calm and comfortable  Respiratory system: Full respiratory effort.  Lungs clear.  Room air Cardiovascular system: S1 & S2 heard, RRR. No JVD, murmurs, rubs, gallops or clicks.  Trace pedal edema Gastrointestinal system: Abdomen is nondistended, soft and nontender. No organomegaly or masses felt. Normal bowel sounds heard. Central nervous system: Alert and oriented. No focal neurological deficits. Extremities: Symmetric 5 x 5 power. Skin: No rashes, lesions or ulcers Psychiatry: Judgement and insight appear normal. Mood & affect appropriate.     Data Reviewed: I have personally reviewed following labs and imaging studies  CBC: Recent Labs  Lab 05/23/20 0521 05/26/20 0553  WBC 6.6 6.9  NEUTROABS  --  3.5  HGB 8.0* 7.8*  HCT 24.6* 24.9*  MCV 95.3 96.9  PLT 232 846   Basic Metabolic Panel: Recent Labs  Lab 05/23/20 0521 05/26/20 0553  NA 139 139  K 4.2  4.2  CL 104 104  CO2 26 27  GLUCOSE 99 108*  BUN 52* 43*  CREATININE 2.92* 2.63*  CALCIUM 8.3* 8.4*   GFR: Estimated Creatinine Clearance: 23.7 mL/min (A) (by C-G formula based on SCr of 2.63 mg/dL (H)). Liver Function Tests: No results for input(s): AST, ALT, ALKPHOS, BILITOT, PROT, ALBUMIN in the last 168 hours. No results for input(s): LIPASE, AMYLASE in the last 168 hours. No results for input(s): AMMONIA in the last 168 hours. Coagulation Profile: No results for input(s): INR, PROTIME in the last 168 hours. Cardiac Enzymes: No results for input(s): CKTOTAL, CKMB, CKMBINDEX, TROPONINI in the last 168 hours. BNP (last 3 results) No results for input(s): PROBNP in the last 8760  hours. HbA1C: No results for input(s): HGBA1C in the last 72 hours. CBG: Recent Labs  Lab 05/28/20 1302 05/28/20 1721 05/28/20 2136 05/29/20 0804 05/29/20 1209  GLUCAP 100* 103* 125* 82 82   Lipid Profile: No results for input(s): CHOL, HDL, LDLCALC, TRIG, CHOLHDL, LDLDIRECT in the last 72 hours. Thyroid Function Tests: No results for input(s): TSH, T4TOTAL, FREET4, T3FREE, THYROIDAB in the last 72 hours. Anemia Panel: No results for input(s): VITAMINB12, FOLATE, FERRITIN, TIBC, IRON, RETICCTPCT in the last 72 hours. Sepsis Labs: No results for input(s): PROCALCITON, LATICACIDVEN in the last 168 hours.  No results found for this or any previous visit (from the past 240 hour(s)).       Radiology Studies: No results found.      Scheduled Meds: . amLODipine  10 mg Oral Daily  . benzonatate  200 mg Oral TID  . carvedilol  12.5 mg Oral BID WC  . Chlorhexidine Gluconate Cloth  6 each Topical Q0600  . epoetin (EPOGEN/PROCRIT) injection  4,000 Units Intravenous Q M,W,F-HD  . feeding supplement  237 mL Oral Q24H  . heparin  5,000 Units Subcutaneous Q8H  . insulin aspart  0-9 Units Subcutaneous TID WC  . lidocaine  1 patch Transdermal Q24H  . losartan  75 mg Oral Daily  . melatonin  5 mg Oral QPM  . methocarbamol  500 mg Oral QID  . multivitamin  1 tablet Oral QHS  . polyethylene glycol  17 g Oral Daily  . senna-docusate  1 tablet Oral BID  . sevelamer carbonate  800 mg Oral TID WC  . sodium chloride flush  3 mL Intravenous Q12H  . topiramate  25 mg Oral BID  . torsemide  40 mg Oral Daily  . traZODone  50 mg Oral QPM  . vitamin B-12  100 mcg Oral Daily   Continuous Infusions: . sodium chloride       LOS: 28 days    Time spent: 15 minutes    Sidney Ace, MD [facility] Pager [phone]  If 7PM-7AM, please contact night-coverage 05/29/2020, 1:31 PM

## 2020-05-30 LAB — GLUCOSE, CAPILLARY
Glucose-Capillary: 101 mg/dL — ABNORMAL HIGH (ref 70–99)
Glucose-Capillary: 136 mg/dL — ABNORMAL HIGH (ref 70–99)
Glucose-Capillary: 90 mg/dL (ref 70–99)
Glucose-Capillary: 97 mg/dL (ref 70–99)

## 2020-05-30 MED ORDER — SUMATRIPTAN SUCCINATE 50 MG PO TABS
25.0000 mg | ORAL_TABLET | ORAL | Status: DC | PRN
  Administered 2020-05-30 – 2020-06-19 (×16): 25 mg via ORAL
  Filled 2020-05-30 (×20): qty 1

## 2020-05-30 NOTE — Progress Notes (Signed)
[facility]  ROUNDING NOTE   Subjective:  Pt due for dialysis today.  Ambulates with walker.  Will live with friend upon discharge.   Objective:  Vital signs in last 24 hours:  Temp:  [98 F (36.7 C)-98.9 F (37.2 C)] 98.4 F (36.9 C) (01/17 1559) Pulse Rate:  [65-76] 76 (01/17 1559) Resp:  [14] 14 (01/17 1559) BP: (112-159)/(50-70) 159/56 (01/17 1559) SpO2:  [95 %-96 %] 95 % (01/17 1559) Weight:  [79.7 kg] 79.7 kg (01/17 0245)  Weight change: -0.726 kg Filed Weights   05/28/20 0402 05/29/20 0302 05/30/20 0245  Weight: 78.7 kg 80.4 kg 79.7 kg    Intake/Output: I/O last 3 completed shifts: In: 360 [P.O.:360] Out: 827 [Urine:826; Stool:1]   Intake/Output this shift:  No intake/output data recorded.  Physical Exam: General:  Laying in bed, no acute distress  HEENT  normocephalic, atraumatic  Pulm/lungs  respirations symmetrical, nonlabored, lungs clear  CVS/Heart  S1-S2, no rubs or gallops  Abdomen:   Soft, nontender, nondistended  Extremities:  Trace peripheral edema  Neurologic:  Oriented, speech clear and appropriate  Skin:  No acute rashes, or lesions   Access: RIJ permcath 05/16/20 Dr. Lucky Cowboy    Basic Metabolic Panel: Recent Labs  Lab 05/26/20 0553  NA 139  K 4.2  CL 104  CO2 27  GLUCOSE 108*  BUN 43*  CREATININE 2.63*  CALCIUM 8.4*    Liver Function Tests: No results for input(s): AST, ALT, ALKPHOS, BILITOT, PROT, ALBUMIN in the last 168 hours. No results for input(s): LIPASE, AMYLASE in the last 168 hours. No results for input(s): AMMONIA in the last 168 hours.  CBC: Recent Labs  Lab 05/26/20 0553  WBC 6.9  NEUTROABS 3.5  HGB 7.8*  HCT 24.9*  MCV 96.9  PLT 237    Cardiac Enzymes: No results for input(s): CKTOTAL, CKMB, CKMBINDEX, TROPONINI in the last 168 hours.  BNP: Invalid input(s): POCBNP  CBG: Recent Labs  Lab 05/29/20 1209 05/29/20 1633 05/30/20 0043 05/30/20 0753 05/30/20 1600  GLUCAP 82 91 101* 97 90     Microbiology: Results for orders placed or performed during the hospital encounter of 04/30/20  Resp Panel by RT-PCR (Flu A&B, Covid) Nasopharyngeal Swab     Status: None   Collection Time: 04/30/20  4:01 PM   Specimen: Nasopharyngeal Swab; Nasopharyngeal(NP) swabs in vial transport medium  Result Value Ref Range Status   SARS Coronavirus 2 by RT PCR NEGATIVE NEGATIVE Final    Comment: (NOTE) SARS-CoV-2 target nucleic acids are NOT DETECTED.  The SARS-CoV-2 RNA is generally detectable in upper respiratory specimens during the acute phase of infection. The lowest concentration of SARS-CoV-2 viral copies this assay can detect is 138 copies/mL. A negative result does not preclude SARS-Cov-2 infection and should not be used as the sole basis for treatment or other patient management decisions. A negative result may occur with  improper specimen collection/handling, submission of specimen other than nasopharyngeal swab, presence of viral mutation(s) within the areas targeted by this assay, and inadequate number of viral copies(<138 copies/mL). A negative result must be combined with clinical observations, patient history, and epidemiological information. The expected result is Negative.  Fact Sheet for Patients:  EntrepreneurPulse.com.au  Fact Sheet for Healthcare Providers:  IncredibleEmployment.be  This test is no t yet approved or cleared by the [place] FDA and  has been authorized for detection and/or diagnosis of SARS-CoV-2 by FDA under an Emergency Use Authorization (EUA). This EUA will remain  in  effect (meaning this test can be used) for the duration of the COVID-19 declaration under Section 564(b)(1) of the Act, 21 U.S.C.section 360bbb-3(b)(1), unless the authorization is terminated  or revoked sooner.       Influenza A by PCR NEGATIVE NEGATIVE Final   Influenza B by PCR NEGATIVE NEGATIVE Final    Comment: (NOTE) The Xpert  Xpress SARS-CoV-2/FLU/RSV plus assay is intended as an aid in the diagnosis of influenza from Nasopharyngeal swab specimens and should not be used as a sole basis for treatment. Nasal washings and aspirates are unacceptable for Xpert Xpress SARS-CoV-2/FLU/RSV testing.  Fact Sheet for Patients: EntrepreneurPulse.com.au  Fact Sheet for Healthcare Providers: IncredibleEmployment.be  This test is not yet approved or cleared by the [place] FDA and has been authorized for detection and/or diagnosis of SARS-CoV-2 by FDA under an Emergency Use Authorization (EUA). This EUA will remain in effect (meaning this test can be used) for the duration of the COVID-19 declaration under Section 564(b)(1) of the Act, 21 U.S.C. section 360bbb-3(b)(1), unless the authorization is terminated or revoked.  Performed at [facility], [address] [place], [place] [address]   Body fluid culture     Status: None   Collection Time: 05/02/20  4:44 PM   Specimen: PATH Cytology Pleural fluid  Result Value Ref Range Status   Specimen Description   Final    PLEURAL Performed at [facility], [address], [place], [place] [address]    Special Requests   Final    NONE Performed at [facility], [address] [place], [place] [address]    Gram Stain   Final    MODERATE WBC PRESENT, PREDOMINANTLY MONONUCLEAR NO ORGANISMS SEEN    Culture   Final    NO GROWTH Performed at [facility], [place] [address], [place], [place] [address]    Report Status 05/06/2020 FINAL  Final  MRSA PCR Screening     Status: None   Collection Time: 05/16/20  9:29 PM   Specimen: Nasal Mucosa; Nasopharyngeal  Result Value Ref Range Status   MRSA by PCR NEGATIVE NEGATIVE Final    Comment:        The GeneXpert MRSA Assay (FDA approved for NASAL specimens only), is one component of a comprehensive MRSA colonization surveillance program. It is  not intended to diagnose MRSA infection nor to guide or monitor treatment for MRSA infections. Performed at [facility], [address] [place], [place] [address]     Coagulation Studies: No results for input(s): LABPROT, INR in the last 72 hours.  Urinalysis: No results for input(s): COLORURINE, LABSPEC, PHURINE, GLUCOSEU, HGBUR, BILIRUBINUR, KETONESUR, PROTEINUR, UROBILINOGEN, NITRITE, LEUKOCYTESUR in the last 72 hours.  Invalid input(s): APPERANCEUR    Imaging: No results found.   Medications:   . sodium chloride     . amLODipine  10 mg Oral Daily  . benzonatate  200 mg Oral TID  . carvedilol  12.5 mg Oral BID WC  . Chlorhexidine Gluconate Cloth  6 each Topical Q0600  . epoetin (EPOGEN/PROCRIT) injection  4,000 Units Intravenous Q M,W,F-HD  . feeding supplement  237 mL Oral Q24H  . heparin  5,000 Units Subcutaneous Q8H  . insulin aspart  0-9 Units Subcutaneous TID WC  . lidocaine  1 patch Transdermal Q24H  . losartan  75 mg Oral Daily  . melatonin  5 mg Oral QPM  . methocarbamol  500 mg Oral QID  . multivitamin  1 tablet Oral  QHS  . polyethylene glycol  17 g Oral Daily  . senna-docusate  1 tablet Oral BID  . sevelamer carbonate  800 mg Oral TID WC  . sodium chloride flush  3 mL Intravenous Q12H  . topiramate  25 mg Oral BID  . torsemide  40 mg Oral Daily  . traZODone  50 mg Oral QPM  . vitamin B-12  100 mcg Oral Daily   sodium chloride, acetaminophen **OR** acetaminophen, ALPRAZolam, bisacodyl, chlorpheniramine-HYDROcodone, ondansetron **OR** ondansetron (ZOFRAN) IV, oxyCODONE-acetaminophen, promethazine, sodium chloride flush  Assessment/ Plan:  Ms. Shelia Kingsberry is a 65 y.o. white female with diastolic congestive heart failure, hypertension, diabetes mellitus type II who is admitted to [facility] on 04/30/2020 for Chronic kidney disease (CKD), stage IV (severe) (HCC) [N18.4] Volume overload [E87.70]  Patient initiated on hemodialysis this admission.    1. End stage renal disease requiring dialysis Mondays and Fridays  Secondary to hypertension and atherosclerosis.   History of nonsteroidal use  -Will plan for HD today, orders prepared.  2. Hypertension with lower extremity edema:   Continue amlodipine, carvedilol, losartan, and torsemide.  3. Anemia of chronic kidney disease:  Lab Results  Component Value Date   HGB 7.8 (L) 05/26/2020  Continue Epogen 4000 units with dialysis treatments  4. Secondary hyperparathyroidism Lab Results  Component Value Date   PTH 70 (H) 05/12/2020   CALCIUM 8.4 (L) 05/26/2020   PHOS 4.2 05/21/2020  Continue to monitor bone mineral metabolism.   5.  Diabetes type 2 with CKD Patient is on insulin aspart, glycemic control per hospitalist.    LOS: 29 Marylon Verno 1/17/20225:00 PM

## 2020-05-30 NOTE — Progress Notes (Signed)
PROGRESS NOTE    Katie [name]  CZY:[id number] DOB: 06-Jun-1955 DOA: 04/30/2020 PCP: Patient, [name]  Brief Narrative:  Katie [name] a 65 y.o.female with a history of diabetes mellitus type 2, stage IV CKD, heart failure, hypertension. Patient presented secondary to dyspnea with evidence of volume overload and pleural effusion.  Patient has had an extended hospital course.  Her acute medical issues have essentially resolved at this point.  She is medically stable for discharge however we do not have a safe disposition plan.  Patient does not have stable living and lives with a friend here.  She states that the friend likely will not be able to care for her.  TOC working to find placement however multiple facilities have refused at this point.  Senior case management involved.  Expanding search.  Patient remains stable.  Unmotivated however, takes a lot of encouragement for exertion.   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 Cardiomegaly on chest x-ray.  Started on Lasix IV on admission but has now progressed to requiring hemodialysis. CKD likely progressed to ESRD Volume status has improved Plan: Nephrology following for hemodialysis PermCath placed in right chest Patient has outpatient hemodialysis chair however skilled nursing facility has been the barrier to discharge.  Senior Case management involved.  Multiple facilities contacted.  Disposition plan pending. 1/17: No safe dispo plan yet  Pleural effusion In setting of fluid  overload with new diagnosis of grade 2 diastolic heart failure.  No previous history of pleural effusion.  Diagnostic right thoracentesis performed on 12/20.  No evidence of malignancy on cytology  Diabetes mellitus, type 2 Hemoglobin A1C of 4.7%.  No need for pharmacologic treatment. Diet control.  ESRD Unknown baseline. Patient is unsure of her primary nephrologist in [place].  Creatinine of 2.97 on admission.  Per patient, she was planned to start hemodialysis in addition to consideration for renal transplant.  Nephrology consulted on 12/20.  Worsening renal function in setting of attempted Lasix diuresis for heart failure. Nephrology now started hemodialysis. Plan: Patient accepted at [facility] Pending financial clearance for discharge planning TOC attempting disposition to SNF Next dialysis Tuesday, 05/31/2020 Nephrology following    Acute diastolic heart failure New diagnosis. Grade 2 dysfunction. Normal EF.  Associated RV systolic dysfunction with elevated PA pressure seen on TTE although RHC on 12/28 significant for mildly elevated right heart/pulmonary artery pressures.  Net -14L over admission Plan: Torsemide 40 mg p.o. daily per nephrology recommendations Daily weights Strict I's and O's  Moderate pericardial effusion No clinical evidence of tamponade.  No recent infections.  In setting of CKD V with elevated BUN. No chest pain. Plan: -Cardiology recommendation: outpatient Transthoracic Echocardiogram for surveillance  Mild/moderate aortic stenosis Unsure if this is symptomatic.  Patient does have dyspnea with exertion but this may be multifactorial. -Cardiology recommendations: outpatient Transthoracic Echocardiogram for surveillance  Primary hypertension Patient is on at least hydrochlorothiazide as an outpatient but is unsure of her regimen. Improved control -Losartan, carvedilol, amlodipine -Lasix as above  Anemia Possible related  to underlying kidney disease.  Evidence of macrocytosis on CBC.  TIBC low but otherwise normal iron panel. Vitamin B12 level 358, target > 400, started oral supplement and folate normal.  Hyperkalemia In setting of CKD. Lokelma 10 g given with improvement.  Pressure injury Mid coccyx, POA  History of tobacco use Patient has quit for at least 10 years. Previously, 20 pack year history.  Headache Normal chronic headache. Patient states she takes Imitrex as needed.  Continue Tylenol and oxycodone as needed 1/8 s/p  Fioricet 1 dose 1/9 IV Solu-Medrol 60 mg x 1 dose, Benadryl 25 mg IV x1 dose and Phenergan 25 mg IV x1 dose given  1/10 Headache improved, patient slept well it seems insomnia is contributing to headache  Insomnia, started trazodone and melatonin Continue melatonin and trazodone nightly, seems to be helping patient to sleep   Obesity Body mass index is 30.91 kg/m.   DVT prophylaxis: SQ heparin Code Status: Full Family Communication: None today Disposition Plan: Status is: Inpatient  Remains inpatient appropriate because:Unsafe d/c plan   Dispo: The patient is from: Home              Anticipated d/c is to: SNF              Anticipated d/c date is: ??              Patient currently is medically stable to d/c.  Patient currently medically stable for discharge.  Outpatient hemodialysis chair has been arranged.  Skilled nursing facility placement has been the barrier to discharge.  Continue case management involved.  Disposition plan pending.       Consultants:   Nephrology  Procedures:   Vas-Cath placement  Antimicrobials:   None   Subjective: Seen and examined.  No complaints.  No overnight events  Objective: Vitals:   05/29/20 2127 05/30/20 0245 05/30/20 0449 05/30/20 0753  BP: 132/66  (!) 112/50 (!) 119/57  Pulse: 73  67 65  Resp:      Temp: 98.9 F (37.2 C)   98 F (36.7 C)  TempSrc: Oral   Oral  SpO2: 96%  96% 95%  Weight:   79.7 kg    Height:        Intake/Output Summary (Last 24 hours) at 05/30/2020 1339 Last data filed at 05/30/2020 0650 Gross per 24 hour  Intake 360 ml  Output 827 ml  Net -467 ml   Filed Weights   05/28/20 0402 05/29/20 0302 05/30/20 0245  Weight: 78.7 kg 80.4 kg 79.7 kg    Examination:  General exam: Appears calm and comfortable  Respiratory system: Full respiratory effort.  Lungs clear.  Room air Cardiovascular system: S1 & S2 heard, RRR. No JVD, murmurs, rubs, gallops or clicks.  Trace pedal edema Gastrointestinal system: Abdomen is nondistended, soft and nontender. No organomegaly or masses felt. Normal bowel sounds heard. Central nervous system: Alert and oriented. No focal neurological deficits. Extremities: Symmetric 5 x 5 power. Skin: No rashes, lesions or ulcers Psychiatry: Judgement and insight appear normal. Mood & affect appropriate.     Data Reviewed: I have personally reviewed following labs and imaging studies  CBC: Recent Labs  Lab 05/26/20 0553  WBC 6.9  NEUTROABS 3.5  HGB 7.8*  HCT 24.9*  MCV 96.9  PLT 706   Basic Metabolic Panel: Recent Labs  Lab 05/26/20 0553  NA 139  K 4.2  CL 104  CO2 27  GLUCOSE 108*  BUN 43*  CREATININE 2.63*  CALCIUM 8.4*   GFR: Estimated Creatinine Clearance: 23.6 mL/min (A) (by C-G formula based on SCr of 2.63 mg/dL (H)). Liver Function Tests: No results for input(s): AST, ALT, ALKPHOS, BILITOT, PROT, ALBUMIN in the last 168 hours. No results for input(s): LIPASE, AMYLASE in the last 168 hours. No results for input(s): AMMONIA in the last 168 hours. Coagulation Profile: No results for input(s): INR, PROTIME in the last 168 hours. Cardiac Enzymes: No results for input(s): CKTOTAL, CKMB, CKMBINDEX, TROPONINI in the last 168 hours. BNP (last 3 results) No results for input(s): PROBNP in the last 8760 hours. HbA1C: No results for input(s): HGBA1C in the last 72 hours. CBG: Recent Labs  Lab 05/29/20 0804  05/29/20 1209 05/29/20 1633 05/30/20 0043 05/30/20 0753  GLUCAP 82 82 91 101* 97   Lipid Profile: No results for input(s): CHOL, HDL, LDLCALC, TRIG, CHOLHDL, LDLDIRECT in the last 72 hours. Thyroid Function Tests: No results for input(s): TSH, T4TOTAL, FREET4, T3FREE, THYROIDAB in the last 72 hours. Anemia Panel: No results for input(s): VITAMINB12, FOLATE, FERRITIN, TIBC, IRON, RETICCTPCT in the last 72 hours. Sepsis Labs: No results for input(s): PROCALCITON, LATICACIDVEN in the last 168 hours.  No results found for this or any previous visit (from the past 240 hour(s)).       Radiology Studies: No results found.      Scheduled Meds: . amLODipine  10 mg Oral Daily  . benzonatate  200 mg Oral TID  . carvedilol  12.5 mg Oral BID WC  . Chlorhexidine Gluconate Cloth  6 each Topical Q0600  . epoetin (EPOGEN/PROCRIT) injection  4,000 Units Intravenous Q M,W,F-HD  . feeding supplement  237 mL Oral Q24H  . heparin  5,000 Units Subcutaneous Q8H  . insulin aspart  0-9 Units Subcutaneous TID WC  . lidocaine  1 patch Transdermal Q24H  . losartan  75 mg Oral Daily  . melatonin  5 mg Oral QPM  . methocarbamol  500 mg Oral QID  . multivitamin  1 tablet Oral QHS  . polyethylene glycol  17 g Oral Daily  . senna-docusate  1 tablet Oral BID  . sevelamer carbonate  800 mg Oral TID WC  . sodium chloride flush  3 mL Intravenous Q12H  . topiramate  25 mg Oral BID  . torsemide  40 mg Oral Daily  . traZODone  50 mg Oral QPM  . vitamin B-12  100 mcg Oral Daily   Continuous Infusions: . sodium chloride       LOS: 29 days    Time spent: 15 minutes    Sidney Ace, MD [facility] Pager [phone]  If 7PM-7AM, please contact night-coverage 05/30/2020, 1:39 PM

## 2020-05-30 NOTE — Progress Notes (Signed)
Mobility Specialist - Progress Note   05/30/20 1500  Mobility  Activity Contraindicated/medical hold  Mobility performed by Mobility specialist    Per discussion with nurse, pt currently off unit for HD treatment. Will attempt session at another date/time as pt becomes available.    Kathee Delton Mobility Specialist 05/30/20, 3:25 PM

## 2020-05-30 NOTE — Plan of Care (Signed)
  Problem: Clinical Measurements: Goal: Respiratory complications will improve Outcome: Progressing   Problem: Clinical Measurements: Goal: Cardiovascular complication will be avoided Outcome: Progressing   Problem: Safety: Goal: Ability to remain free from injury will improve Outcome: Progressing   

## 2020-05-31 LAB — GLUCOSE, CAPILLARY: Glucose-Capillary: 93 mg/dL (ref 70–99)

## 2020-05-31 MED ORDER — VITAMIN D 25 MCG (1000 UNIT) PO TABS
1000.0000 [IU] | ORAL_TABLET | Freq: Every day | ORAL | Status: DC
  Administered 2020-06-01 – 2020-07-02 (×31): 1000 [IU] via ORAL
  Filled 2020-05-31 (×29): qty 1

## 2020-05-31 MED ORDER — MIDODRINE HCL 5 MG PO TABS
10.0000 mg | ORAL_TABLET | Freq: Two times a day (BID) | ORAL | Status: DC
  Administered 2020-05-31: 10 mg via ORAL
  Filled 2020-05-31: qty 2

## 2020-05-31 MED ORDER — MIDODRINE HCL 5 MG PO TABS
10.0000 mg | ORAL_TABLET | ORAL | Status: DC
  Administered 2020-06-01 – 2020-06-03 (×2): 10 mg via ORAL
  Filled 2020-05-31 (×3): qty 2

## 2020-05-31 MED ORDER — ENSURE ENLIVE PO LIQD
237.0000 mL | Freq: Two times a day (BID) | ORAL | Status: DC
  Administered 2020-06-01 – 2020-07-02 (×32): 237 mL via ORAL

## 2020-05-31 NOTE — Progress Notes (Signed)
PT Cancellation Note  Patient Details Name: Katie [name] MRN: [id number] DOB: 05/23/1955   Cancelled Treatment:    Reason Eval/Treat Not Completed: Other (comment). PT entered room, and pt immediately requested that PT attempt later. Reported she has a headache and nausea currently and just had her medication. PT unsure of ability to return but pt still declining PT. To re-attempt as able.   Lieutenant Diego PT, DPT 11:21 AM,05/31/20

## 2020-05-31 NOTE — Progress Notes (Signed)
Mobility Specialist - Progress Note   05/31/20 1500  Mobility  Activity Stood at bedside  Range of Motion/Exercises Right leg;Left leg (AP, SLR, HR, TR, ABD)  Level of Assistance Minimal assist, patient does 75% or more  Assistive Device Front wheel walker  Distance Ambulated (ft) 0 ft  Mobility Response Tolerated well  Mobility performed by Mobility specialist  $Mobility charge 1 Mobility    Supine BP: 134/70 EOB BP: 115/58 Standing BP: 74/49    Pt was lying in bed upon arrival utilizing room air. Pt agreed to session. Pt's orthostatic BP was measured and recorded above prior to activity. Pt denied dizziness while sitting EOB. Pt stood to RW and began c/o seeing "black dots" with BP drop, limiting ambulatory activity. Noted soiled chucks upon standing d/t large BM. Pt returned to supine with supervision. Mobility assisted with peri-care and dry bath setup. Symptoms did resolve with position change and pt requested to perform EOB exercises. Pt sat EOB a second time with mild dizziness. Pt performed exercises: ankle pumps x20, straight leg raises x10, heel raises x10, toe raises x10, abduction x10, and seated march x20. Pt motivated to ambulate this date, but limited d/t orthostatic VS and symptoms. Overall, pt tolerated session well. Pt was left in bed with all needs in reach and alarm set.    Kathee Delton Mobility Specialist 05/31/20, 3:19 PM

## 2020-05-31 NOTE — Progress Notes (Signed)
[facility]  ROUNDING NOTE   Subjective:  Patient seen at bedside. Reports significant lightheadedness when she stands. States that when she was living in [place] she was on midodrine.  Objective:  Vital signs in last 24 hours:  Temp:  [98.4 F (36.9 C)-99.1 F (37.3 C)] 99.1 F (37.3 C) (01/18 1303) Pulse Rate:  [68-87] 73 (01/18 1609) Resp:  [15-16] 15 (01/18 1609) BP: (132-148)/(54-74) 132/54 (01/18 1609) SpO2:  [93 %-96 %] 94 % (01/18 1609) Weight:  [78.2 kg] 78.2 kg (01/18 0112)  Weight change: -1.542 kg Filed Weights   05/29/20 0302 05/30/20 0245 05/31/20 0112  Weight: 80.4 kg 79.7 kg 78.2 kg    Intake/Output: I/O last 3 completed shifts: In: 120 [P.O.:120] Out: 1102 [Urine:1101; Stool:1]   Intake/Output this shift:  Total I/O In: 600 [P.O.:600] Out: -   Physical Exam: General:  Laying in bed, no acute distress  HEENT  normocephalic, atraumatic  Pulm/lungs  clear bilateral  CVS/Heart  S1-S2, no rubs or gallops  Abdomen:   Soft, nontender, nondistended  Extremities:  No lower extremity edema  Neurologic:  Oriented, speech clear and appropriate  Skin:  No acute rashes, or lesions   Access: RIJ permcath 05/16/20 Dr. Lucky Cowboy    Basic Metabolic Panel: Recent Labs  Lab 05/26/20 0553  NA 139  K 4.2  CL 104  CO2 27  GLUCOSE 108*  BUN 43*  CREATININE 2.63*  CALCIUM 8.4*    Liver Function Tests: No results for input(s): AST, ALT, ALKPHOS, BILITOT, PROT, ALBUMIN in the last 168 hours. No results for input(s): LIPASE, AMYLASE in the last 168 hours. No results for input(s): AMMONIA in the last 168 hours.  CBC: Recent Labs  Lab 05/26/20 0553  WBC 6.9  NEUTROABS 3.5  HGB 7.8*  HCT 24.9*  MCV 96.9  PLT 237    Cardiac Enzymes: No results for input(s): CKTOTAL, CKMB, CKMBINDEX, TROPONINI in the last 168 hours.  BNP: Invalid input(s): POCBNP  CBG: Recent Labs  Lab 05/30/20 0043 05/30/20 0753 05/30/20 1600 05/30/20 2002  05/31/20 0810  GLUCAP 101* 97 90 136* 93    Microbiology: Results for orders placed or performed during the hospital encounter of 04/30/20  Resp Panel by RT-PCR (Flu A&B, Covid) Nasopharyngeal Swab     Status: None   Collection Time: 04/30/20  4:01 PM   Specimen: Nasopharyngeal Swab; Nasopharyngeal(NP) swabs in vial transport medium  Result Value Ref Range Status   SARS Coronavirus 2 by RT PCR NEGATIVE NEGATIVE Final    Comment: (NOTE) SARS-CoV-2 target nucleic acids are NOT DETECTED.  The SARS-CoV-2 RNA is generally detectable in upper respiratory specimens during the acute phase of infection. The lowest concentration of SARS-CoV-2 viral copies this assay can detect is 138 copies/mL. A negative result does not preclude SARS-Cov-2 infection and should not be used as the sole basis for treatment or other patient management decisions. A negative result may occur with  improper specimen collection/handling, submission of specimen other than nasopharyngeal swab, presence of viral mutation(s) within the areas targeted by this assay, and inadequate number of viral copies(<138 copies/mL). A negative result must be combined with clinical observations, patient history, and epidemiological information. The expected result is Negative.  Fact Sheet for Patients:  EntrepreneurPulse.com.au  Fact Sheet for Healthcare Providers:  IncredibleEmployment.be  This test is no t yet approved or cleared by the [place] FDA and  has been authorized for detection and/or diagnosis of SARS-CoV-2 by FDA under an Emergency Use Authorization (  EUA). This EUA will remain  in effect (meaning this test can be used) for the duration of the COVID-19 declaration under Section 564(b)(1) of the Act, 21 U.S.C.section 360bbb-3(b)(1), unless the authorization is terminated  or revoked sooner.       Influenza A by PCR NEGATIVE NEGATIVE Final   Influenza B by PCR NEGATIVE  NEGATIVE Final    Comment: (NOTE) The Xpert Xpress SARS-CoV-2/FLU/RSV plus assay is intended as an aid in the diagnosis of influenza from Nasopharyngeal swab specimens and should not be used as a sole basis for treatment. Nasal washings and aspirates are unacceptable for Xpert Xpress SARS-CoV-2/FLU/RSV testing.  Fact Sheet for Patients: EntrepreneurPulse.com.au  Fact Sheet for Healthcare Providers: IncredibleEmployment.be  This test is not yet approved or cleared by the [place] FDA and has been authorized for detection and/or diagnosis of SARS-CoV-2 by FDA under an Emergency Use Authorization (EUA). This EUA will remain in effect (meaning this test can be used) for the duration of the COVID-19 declaration under Section 564(b)(1) of the Act, 21 U.S.C. section 360bbb-3(b)(1), unless the authorization is terminated or revoked.  Performed at [facility], [address] [place], [place] [address]   Body fluid culture     Status: None   Collection Time: 05/02/20  4:44 PM   Specimen: PATH Cytology Pleural fluid  Result Value Ref Range Status   Specimen Description   Final    PLEURAL Performed at [facility], [address], [place], [place] [address]    Special Requests   Final    NONE Performed at [facility], [address] [place], [place] [address]    Gram Stain   Final    MODERATE WBC PRESENT, PREDOMINANTLY MONONUCLEAR NO ORGANISMS SEEN    Culture   Final    NO GROWTH Performed at [facility], [place] [address] [place], [place] [address]    Report Status 05/06/2020 FINAL  Final  MRSA PCR Screening     Status: None   Collection Time: 05/16/20  9:29 PM   Specimen: Nasal Mucosa; Nasopharyngeal  Result Value Ref Range Status   MRSA by PCR NEGATIVE NEGATIVE Final    Comment:        The GeneXpert MRSA Assay (FDA approved for NASAL specimens only), is one component of a comprehensive MRSA  colonization surveillance program. It is not intended to diagnose MRSA infection nor to guide or monitor treatment for MRSA infections. Performed at [facility], [address] [place], [place] [address]     Coagulation Studies: No results for input(s): LABPROT, INR in the last 72 hours.  Urinalysis: No results for input(s): COLORURINE, LABSPEC, PHURINE, GLUCOSEU, HGBUR, BILIRUBINUR, KETONESUR, PROTEINUR, UROBILINOGEN, NITRITE, LEUKOCYTESUR in the last 72 hours.  Invalid input(s): APPERANCEUR    Imaging: No results found.   Medications:   . sodium chloride     . amLODipine  10 mg Oral Daily  . benzonatate  200 mg Oral TID  . carvedilol  12.5 mg Oral BID WC  . Chlorhexidine Gluconate Cloth  6 each Topical Q0600  . [START ON 06/01/2020] cholecalciferol  1,000 Units Oral Daily  . epoetin (EPOGEN/PROCRIT) injection  4,000 Units Intravenous Q M,W,F-HD  . [START ON 06/01/2020] feeding supplement  237 mL Oral BID BM  . heparin  5,000 Units Subcutaneous Q8H  . lidocaine  1 patch Transdermal Q24H  . losartan  75 mg Oral Daily  . melatonin  5 mg Oral QPM  . methocarbamol  500 mg Oral QID  . midodrine  10 mg Oral BID WC  . multivitamin  1 tablet Oral QHS  . polyethylene glycol  17 g Oral Daily  . senna-docusate  1 tablet Oral BID  . sevelamer carbonate  800 mg Oral TID WC  . sodium chloride flush  3 mL Intravenous Q12H  . topiramate  25 mg Oral BID  . torsemide  40 mg Oral Daily  . traZODone  50 mg Oral QPM  . vitamin B-12  100 mcg Oral Daily   sodium chloride, acetaminophen **OR** acetaminophen, ALPRAZolam, bisacodyl, chlorpheniramine-HYDROcodone, ondansetron **OR** ondansetron (ZOFRAN) IV, oxyCODONE-acetaminophen, promethazine, sodium chloride flush, SUMAtriptan  Assessment/ Plan:  Katie [name] is a 65 y.o. white female with diastolic congestive heart failure, hypertension, diabetes mellitus type II who is admitted to [facility] on 04/30/2020 for Chronic kidney  disease (CKD), stage IV (severe) ([place]) [N18.4] Volume overload [E87.70]  Patient initiated on hemodialysis this admission.   1. End stage renal disease requiring dialysis Mondays and Fridays  Secondary to hypertension and atherosclerosis.   History of nonsteroidal use  -Patient underwent hemodialysis yesterday. Volume status much improved as compared to previous. Minimal ultrafiltration planned for tomorrow.  2. Hypertension with lower extremity edema:  Challenging situation as she has some element of autonomic neuropathy and orthostatic hypotension. Reporting significant dizziness today. We will administer midodrine on dialysis days.  3. Anemia of chronic kidney disease:  Lab Results  Component Value Date   HGB 7.8 (L) 05/26/2020  Continue Epogen 4000 units with dialysis treatments  4. Secondary hyperparathyroidism Lab Results  Component Value Date   PTH 70 (H) 05/12/2020   CALCIUM 8.4 (L) 05/26/2020   PHOS 4.2 05/21/2020  Continue to monitor bone mineral metabolism.   5.  Diabetes type 2 with CKD Glycemic control per hospitalist.    LOS: 30 Katie [name] 1/18/20226:57 PM

## 2020-05-31 NOTE — Progress Notes (Signed)
Nutrition Follow Up Note   DOCUMENTATION CODES:   Obesity unspecified  INTERVENTION:   Ensure Enlive po BID, each supplement provides 350 kcal and 20 grams of protein  Rena-vit po daily  Liberalize diet   Recommend cholecalciferol 1000 units po daily   NUTRITION DIAGNOSIS:   Increased nutrient needs related to chronic illness (CKD IV with temp HD, CHF) as evidenced by estimated needs.  GOAL:   Patient will meet greater than or equal to 90% of their needs  -progressing   MONITOR:   PO intake,Supplement acceptance,Labs,Weight trends,Skin,I & O's  ASSESSMENT:   65 y/o female with h/o CKD IV, CHF, DM and Katie [name] who is admitted with AKI and volume overload.  RD working remotely.  Pt continues to have good appetite and oral intake in [name]; pt eating 50-100% of meals. Pt is drinking chocolate Ensure supplements. RD will liberalize pt's diet as the renal diet is highly restrictive and pt is now receiving HD. Per chart, pt is down ~28lbs since admit. Last HD 1/17. Of note, pt with vitamin D deficiency; would recommend supplementation.   Medications reviewed and include: epoetin, heparin, melatonin, rena-vit, miralax, senokot, B12, renvela  Labs reviewed: K 4.2 wnl, BUN 43(H), creat 2.63(H), P 4.2 wnl, Mg 1.9 wnl- 1/8 Vitamin D 18.79(L)- 1/10 Hgb 7.8(L), Hct 24.9(L)  NUTRITION - FOCUSED PHYSICAL EXAM: Unable to perform at this time   Diet Order:   Diet Order            Diet 2 gram sodium Room service appropriate? Yes; Fluid consistency: Thin; Fluid restriction: 1200 mL Fluid  Diet effective now                EDUCATION NEEDS:   Education needs have been addressed  Skin:  Skin Assessment: Reviewed RN Assessment (ecchymosis, Stage II coccyx)  Last BM:  1/17- type 4  Height:   Ht Readings from Last 1 Encounters:  05/16/20 '5\' 6"'$  (1.676 m)    Weight:   Wt Readings from Last 1 Encounters:  05/31/20 78.2 kg    Ideal Body Weight:  59 kg  BMI:  Body mass  index is 27.81 kg/m.  Estimated Nutritional Needs:   Kcal:  1900-2200kcal/day  Protein:  95-110g/day  Fluid:  UOP +1L  Katie Distance MS, RD, LDN Please refer to Katie [name] for RD and/or RD on-call/weekend/after hours pager

## 2020-05-31 NOTE — Progress Notes (Signed)
PT Cancellation Note  Patient Details Name: Katie [name] MRN: [id number] DOB: 08-20-1955   Cancelled Treatment:    Reason Eval/Treat Not Completed: Medical issues which prohibited therapy   Attempted PM PT session.  Mobility specialist in with pt finishing up.  Pt continued to demonstrate orthostatic BP's and reports dizziness which continues to limit mobility.  See her note for details.  She declined further intervention at this time as she had just stood and completed care for bathroom needs.  Will schedule pt for tomorrow for further PT interventions.   Chesley Noon 05/31/2020, 2:49 PM

## 2020-05-31 NOTE — Progress Notes (Signed)
PROGRESS NOTE    Katie [name]  [id number] DOB: Nov 20, 1955 DOA: 04/30/2020 PCP: Patient, [name]  Brief Narrative:  Katie [name] a 65 y.o.female with a history of diabetes mellitus type 2, stage IV CKD, heart failure, hypertension. Patient presented secondary to dyspnea with evidence of volume overload and pleural effusion.  Patient has had an extended hospital course.  Her acute medical issues have essentially resolved at this point.  She is medically stable for discharge however we do not have a safe disposition plan.  Patient does not have stable living and lives with a friend here.  She states that the friend likely will not be able to care for her.  TOC working to find placement however multiple facilities have refused at this point.  Senior case management involved.  Expanding search.  Patient remains stable.  Continues to be very unmotivated always having pain complaints when PT attempted to work with her however then later complains of buttock pain from sitting too much.  I try to reinforce the need for exertion and ambulation daily basis.  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 Cardiomegaly on chest x-ray.  Started on Lasix IV on admission but has now progressed to requiring hemodialysis. CKD likely progressed to ESRD Volume status has improved Plan: Nephrology following for hemodialysis PermCath placed in right chest Patient has outpatient hemodialysis chair however skilled nursing facility has been the barrier to discharge.   Senior Case management involved.  Multiple facilities contacted.  Disposition plan pending. 05/31/2020: Case discussed with TOC of progression rounds.  Still no safe disposition plan found.  Pleural effusion In setting of fluid overload with new diagnosis of grade 2 diastolic heart failure.  No previous history of pleural effusion.  Diagnostic right thoracentesis performed on 12/20.  No evidence of malignancy on cytology  Diabetes mellitus, type 2 Hemoglobin A1C of 4.7%.  No need for pharmacologic treatment. Diet control.  ESRD Unknown baseline. Patient is unsure of her primary nephrologist in [place].  Creatinine of 2.97 on admission.  Per patient, she was planned to start hemodialysis in addition to consideration for renal transplant.  Nephrology consulted on 12/20.  Worsening renal function in setting of attempted Lasix diuresis for heart failure. Nephrology now started hemodialysis. Plan: Patient accepted at [facility] Pending financial clearance for discharge planning TOC attempting disposition to SNF Next dialysis Tuesday, 05/31/2020 Nephrology following    Acute diastolic heart failure New diagnosis. Grade 2 dysfunction. Normal EF.  Associated RV systolic dysfunction with elevated PA pressure seen on TTE although RHC on 12/28 significant for mildly elevated right heart/pulmonary artery pressures.  Net -14L over admission Plan: Torsemide 40 mg p.o. daily per nephrology recommendations Daily weights Strict I's and O's  Moderate pericardial effusion No clinical evidence of tamponade.  No recent infections.  In setting of CKD V with elevated BUN. No chest pain. Plan: -Cardiology recommendation: outpatient Transthoracic Echocardiogram for surveillance  Mild/moderate aortic stenosis Unsure if this is symptomatic.  Patient does have dyspnea with exertion but this may  be multifactorial. -Cardiology recommendations: outpatient Transthoracic Echocardiogram  for surveillance  Primary hypertension Patient is on at least hydrochlorothiazide as an outpatient but is unsure of her regimen. Improved control -Losartan, carvedilol, amlodipine -Lasix as above  Anemia Possible related to underlying kidney disease.  Evidence of macrocytosis on CBC.  TIBC low but otherwise normal iron panel. Vitamin B12 level 358, target > 400, started oral supplement and folate normal.  Hyperkalemia In setting of CKD. Lokelma 10 g given with improvement.  Pressure injury Mid coccyx, POA  History of tobacco use Patient has quit for at least 10 years. Previously, 20 pack year history.  Headache Normal chronic headache. Patient states she takes Imitrex as needed.  Continue Tylenol and oxycodone as needed 1/8 s/p  Fioricet 1 dose 1/9 IV Solu-Medrol 60 mg x 1 dose, Benadryl 25 mg IV x1 dose and Phenergan 25 mg IV x1 dose given  1/10 Headache improved, patient slept well it seems insomnia is contributing to headache  Insomnia, started trazodone and melatonin Continue melatonin and trazodone nightly, seems to be helping patient to sleep   Obesity Body mass index is 30.91 kg/m.   DVT prophylaxis: SQ heparin Code Status: Full Family Communication: None today Disposition Plan: Status is: Inpatient  Remains inpatient appropriate because:Unsafe d/c plan   Dispo: The patient is from: Home              Anticipated d/c is to: SNF              Anticipated d/c date is: ??              Patient currently is medically stable to d/c.  Patient currently medically stable for discharge.  Outpatient hemodialysis chair has been arranged.  Skilled nursing facility placement has been the barrier to discharge.  Senior Case management and administration involved.  Disposition plan pending       Consultants:   Nephrology  Procedures:   Vas-Cath placement  Antimicrobials:   None   Subjective: Seen and examined.  No complaints.  No overnight  events  Objective: Vitals:   05/31/20 0112 05/31/20 0415 05/31/20 0755 05/31/20 1303  BP:  (!) 148/64 (!) 147/61 (!) 143/74  Pulse:  68 72 79  Resp:  '16 15 16  '$ Temp:  98.4 F (36.9 C) 98.5 F (36.9 C) 99.1 F (37.3 C)  TempSrc:   Oral Oral  SpO2:  93% 95% 96%  Weight: 78.2 kg     Height:        Intake/Output Summary (Last 24 hours) at 05/31/2020 1410 Last data filed at 05/31/2020 1010 Gross per 24 hour  Intake 240 ml  Output 600 ml  Net -360 ml   Filed Weights   05/29/20 0302 05/30/20 0245 05/31/20 0112  Weight: 80.4 kg 79.7 kg 78.2 kg    Examination:  General exam: Appears calm and comfortable  Respiratory system: Full respiratory effort.  Lungs clear.  Room air Cardiovascular system: S1 & S2 heard, RRR. No JVD, murmurs, rubs, gallops or clicks.  Trace pedal edema Gastrointestinal system: Abdomen is nondistended, soft and nontender. No organomegaly or masses felt. Normal bowel sounds heard. Central nervous system: Alert and oriented. No focal neurological deficits. Extremities: Symmetric 5 x 5 power. Skin: No rashes, lesions or ulcers Psychiatry: Judgement and insight appear normal. Mood & affect appropriate.     Data Reviewed: I have personally reviewed following labs and imaging studies  CBC: Recent Labs  Lab 05/26/20 0553  WBC 6.9  NEUTROABS  3.5  HGB 7.8*  HCT 24.9*  MCV 96.9  PLT [id number]   Basic Metabolic Panel: Recent Labs  Lab 05/26/20 0553  NA 139  K 4.2  CL 104  CO2 27  GLUCOSE 108*  BUN 43*  CREATININE 2.63*  CALCIUM 8.4*   GFR: Estimated Creatinine Clearance: 23.4 mL/min (A) (by C-G formula based on SCr of 2.63 mg/dL (H)). Liver Function Tests: No results for input(s): AST, ALT, ALKPHOS, BILITOT, PROT, ALBUMIN in the last 168 hours. No results for input(s): LIPASE, AMYLASE in the last 168 hours. No results for input(s): AMMONIA in the last 168 hours. Coagulation Profile: No results for input(s): INR, PROTIME in the last 168  hours. Cardiac Enzymes: No results for input(s): CKTOTAL, CKMB, CKMBINDEX, TROPONINI in the last 168 hours. BNP (last 3 results) No results for input(s): PROBNP in the last 8760 hours. HbA1C: No results for input(s): HGBA1C in the last 72 hours. CBG: Recent Labs  Lab 05/30/20 0043 05/30/20 0753 05/30/20 1600 05/30/20 2002 05/31/20 0810  GLUCAP 101* 97 90 136* 93   Lipid Profile: No results for input(s): CHOL, HDL, LDLCALC, TRIG, CHOLHDL, LDLDIRECT in the last 72 hours. Thyroid Function Tests: No results for input(s): TSH, T4TOTAL, FREET4, T3FREE, THYROIDAB in the last 72 hours. Anemia Panel: No results for input(s): VITAMINB12, FOLATE, FERRITIN, TIBC, IRON, RETICCTPCT in the last 72 hours. Sepsis Labs: No results for input(s): PROCALCITON, LATICACIDVEN in the last 168 hours.  No results found for this or any previous visit (from the past 240 hour(s)).       Radiology Studies: No results found.      Scheduled Meds: . amLODipine  10 mg Oral Daily  . benzonatate  200 mg Oral TID  . carvedilol  12.5 mg Oral BID WC  . Chlorhexidine Gluconate Cloth  6 each Topical Q0600  . epoetin (EPOGEN/PROCRIT) injection  4,000 Units Intravenous Q M,W,F-HD  . feeding supplement  237 mL Oral Q24H  . heparin  5,000 Units Subcutaneous Q8H  . lidocaine  1 patch Transdermal Q24H  . losartan  75 mg Oral Daily  . melatonin  5 mg Oral QPM  . methocarbamol  500 mg Oral QID  . multivitamin  1 tablet Oral QHS  . polyethylene glycol  17 g Oral Daily  . senna-docusate  1 tablet Oral BID  . sevelamer carbonate  800 mg Oral TID WC  . sodium chloride flush  3 mL Intravenous Q12H  . topiramate  25 mg Oral BID  . torsemide  40 mg Oral Daily  . traZODone  50 mg Oral QPM  . vitamin B-12  100 mcg Oral Daily   Continuous Infusions: . sodium chloride       LOS: 30 days    Time spent: 15 minutes    Sidney Ace, MD [facility] Pager [phone]  If 7PM-7AM, please  contact night-coverage 05/31/2020, 2:10 PM

## 2020-06-01 LAB — GLUCOSE, CAPILLARY: Glucose-Capillary: 116 mg/dL — ABNORMAL HIGH (ref 70–99)

## 2020-06-01 LAB — PHOSPHORUS: Phosphorus: 3.6 mg/dL (ref 2.5–4.6)

## 2020-06-01 NOTE — Progress Notes (Signed)
PROGRESS NOTE    Katie [name]  [id number] DOB: 1955-08-29 DOA: 04/30/2020 PCP: Patient, [name]    Brief Narrative:  Katie [name] a 65 y.o.female with a history of diabetes mellitus type 2, stage IV CKD, heart failure, hypertension. Patient presented secondary to dyspnea with evidence of volume overload and pleural effusion.  Patient has had an extended hospital course.  Her acute medical issues have essentially resolved at this point.  She is medically stable for discharge however we do not have a safe disposition plan.  Patient does not have stable living and lives with a friend here.  She states that the friend likely will not be able to care for her.  TOC working to find placement however multiple facilities have refused at this point.  Senior case management involved.  Expanding search.  Patient remains stable.  Continues to be very unmotivated always having pain complaints when PT attempted to work with her however then later complains of buttock pain from sitting too much.  I try to reinforce the need for exertion and ambulation daily basis.   1/19- Had HD today. +orthostatics on PT evaluation today. Pt reports being symptomatic when bp drops  Consultants:   nephrology  Procedures: HD  Antimicrobials:       Subjective: Patient is angry that she is not getting midodrine every day and that she is getting it only on hemodialysis days.  She states that she needs that twice daily dosing daily.  Otherwise she has no other complaints.  She does report becoming symptomatic when she is orthostatics  Objective: Vitals:   05/31/20 2001 06/01/20 0412 06/01/20 0520 06/01/20 0827  BP: (!) 139/58  (!) 134/56 (!) 151/66  Pulse: 74  64 75  Resp: '18  15 18  '$ Temp: 98.6 F (37 C)  98.5 F (36.9 C) 98.6 F (37 C)  TempSrc: Oral  Oral Oral  SpO2: 93%  94% 97%  Weight:  78 kg    Height:        Intake/Output Summary (Last 24 hours) at 06/01/2020 0927 Last data filed  at 05/31/2020 2036 Gross per 24 hour  Intake 600 ml  Output 500 ml  Net 100 ml   Filed Weights   05/30/20 0245 05/31/20 0112 06/01/20 0412  Weight: 79.7 kg 78.2 kg 78 kg    Examination:  General exam: Appears calm and comfortable  Respiratory system: Clear to auscultation. Respiratory effort normal. Cardiovascular system: S1 & S2 heard, RRR. No JVD, murmurs, rubs, gallops or clicks. No pedal edema. Gastrointestinal system: Abdomen is nondistended, soft and nontender. . Normal bowel sounds heard. Central nervous system: Alert and oriented. No focal neurological deficits. Extremities: No edema Psychiatry: Judgement and insight appear normal. Mood & affect appropriate.     Data Reviewed: I have personally reviewed following labs and imaging studies  CBC: Recent Labs  Lab 05/26/20 0553  WBC 6.9  NEUTROABS 3.5  HGB 7.8*  HCT 24.9*  MCV 96.9  PLT 123XX123   Basic Metabolic Panel: Recent Labs  Lab 05/26/20 0553  NA 139  K 4.2  CL 104  CO2 27  GLUCOSE 108*  BUN 43*  CREATININE 2.63*  CALCIUM 8.4*   GFR: Estimated Creatinine Clearance: 23.4 mL/min (A) (by C-G formula based on SCr of 2.63 mg/dL (H)). Liver Function Tests: No results for input(s): AST, ALT, ALKPHOS, BILITOT, PROT, ALBUMIN in the last 168 hours. No results for input(s): LIPASE, AMYLASE in the last 168 hours. No results for input(s): AMMONIA  in the last 168 hours. Coagulation Profile: No results for input(s): INR, PROTIME in the last 168 hours. Cardiac Enzymes: No results for input(s): CKTOTAL, CKMB, CKMBINDEX, TROPONINI in the last 168 hours. BNP (last 3 results) No results for input(s): PROBNP in the last 8760 hours. HbA1C: No results for input(s): HGBA1C in the last 72 hours. CBG: Recent Labs  Lab 05/30/20 0043 05/30/20 0753 05/30/20 1600 05/30/20 2002 05/31/20 0810  GLUCAP 101* 97 90 136* 93   Lipid Profile: No results for input(s): CHOL, HDL, LDLCALC, TRIG, CHOLHDL, LDLDIRECT in the last 72  hours. Thyroid Function Tests: No results for input(s): TSH, T4TOTAL, FREET4, T3FREE, THYROIDAB in the last 72 hours. Anemia Panel: No results for input(s): VITAMINB12, FOLATE, FERRITIN, TIBC, IRON, RETICCTPCT in the last 72 hours. Sepsis Labs: No results for input(s): PROCALCITON, LATICACIDVEN in the last 168 hours.  No results found for this or any previous visit (from the past 240 hour(s)).       Radiology Studies: No results found.      Scheduled Meds: . amLODipine  10 mg Oral Daily  . benzonatate  200 mg Oral TID  . carvedilol  12.5 mg Oral BID WC  . Chlorhexidine Gluconate Cloth  6 each Topical Q0600  . cholecalciferol  1,000 Units Oral Daily  . epoetin (EPOGEN/PROCRIT) injection  4,000 Units Intravenous Q M,W,F-HD  . feeding supplement  237 mL Oral BID BM  . heparin  5,000 Units Subcutaneous Q8H  . lidocaine  1 patch Transdermal Q24H  . losartan  75 mg Oral Daily  . melatonin  5 mg Oral QPM  . methocarbamol  500 mg Oral QID  . midodrine  10 mg Oral Q M,W,F  . multivitamin  1 tablet Oral QHS  . polyethylene glycol  17 g Oral Daily  . senna-docusate  1 tablet Oral BID  . sevelamer carbonate  800 mg Oral TID WC  . sodium chloride flush  3 mL Intravenous Q12H  . topiramate  25 mg Oral BID  . torsemide  40 mg Oral Daily  . traZODone  50 mg Oral QPM  . vitamin B-12  100 mcg Oral Daily   Continuous Infusions: . sodium chloride      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)   Hyperkalemia   Pressure injury of skin   Pleural effusion   Acute heart failure with preserved ejection fraction (HFpEF) (HCC)   Chronic kidney disease (CKD), stage IV (severe) (HCC)   Pulmonary hypertension, unspecified (HCC)   Volume overload In setting of CKD. Cardiomegaly on chest x-ray.  Started on Lasix IV  on admission but has now progressed to requiring hemodialysis. CKD likely progressed to ESRD 1/19-on hemodialysis.  Had dialysis today. euvolemic on exam Has permacath placed in right chest  Outpatient dialysis chair set up when patient is discharged    Pleural effusion In setting of fluid overload with new diagnosis of grade 2 diastolic heart failure.  Status post diagnostic right thoracentesis performed on 12/20 No evidence of malignancy on cytology   Diabetes mellitus, type 2 Hemoglobin A1C of 4.7%.  Controlled No need for pharmacologic treatment Diet controlled No need for pharmacologic treatment. Diet control.  ESRD Unknown baseline. 2/2 HTN and atherosclerosis Hx/o NSADS Patient is unsure of her primary nephrologist in [place].  Creatinine of 2.97 on admission.  Per patient, she was planned to start hemodialysis in addition to consideration for renal transplant.  Nephrology consulted on 12/20.  Worsening renal function in setting of attempted Lasix diuresis for heart failure. Nephrology now started hemodialysis. 1/19-getting dialysis on Monday Wednesday Fridays now.   Excepted at [facility]  Pending financial clearance for discharge planning  TOC attempting disposition to SNF  Nephrology following      Acute diastolic heart failure New diagnosis. Grade 2 dysfunction. Normal EF.  Associated RV systolic dysfunction with elevated PA pressure seen on TTE although RHC on 12/28 significant for mildly elevated right heart/pulmonary artery pressures.  1/19- more euvolemic. Gets orthostatics Continue torsemide pr nephrology rec. I/o  Daily weight   Moderate pericardial effusion No clinical evidence of tamponade.  No recent infections.  In setting of CKD V with elevated BUN. No chest pain. 1/19-cardiology rec. Outpatient Echo for surveillance Hemodynamics stable , continue to monitor   Mild/moderate aortic stenosis Unsure if this is symptomatic.   Patient does have dyspnea with exertion but this may be multifactorial. -Cardiology recommendations: outpatient Transthoracic Echocardiogram for surveillance  Primary hypertension Patient is on at least hydrochlorothiazide as an outpatient but is unsure of her regimen. Improved control -Losartan, carvedilol, amlodipine -Lasix as above  Anemia Possible related to underlying kidney disease.  Evidence of macrocytosis on CBC.  TIBC low but otherwise normal iron panel. Vitamin B12 level 358, target > 400, started oral supplement and folate normal.  Hyperkalemia In setting of CKD. Lokelma 10 g given with improvement.  Pressure injury Mid coccyx, POA  History of tobacco use Patient has quit for at least 10 years. Previously, 20 pack year history.  Headache Normal chronic headache. Patient states she takes Imitrex as needed.  Continue Tylenol and oxycodone as needed 1/8 s/p Fioricet 1 dose 1/9 IV Solu-Medrol 60 mg x 1 dose, Benadryl 25 mg IV x1 dose and Phenergan 25 mg IV x1 dose given 1/10Headache improved,patient slept well it seems insomnia is contributing to headache  Insomnia, started trazodone and melatonin Continue melatonin and trazodone nightly,seems to be helping patient to sleep   Obesity Body mass index is 30.91 kg/m.   DVT prophylaxis: Heparin Code Status: Full Family Communication: None at bedside  Status is: Inpatient  Remains inpatient appropriate because:Unsafe d/c plan   Dispo: The patient is from: Home              Anticipated d/c is to: SNF              Anticipated d/c date is: > 3 days              Patient currently is medically stable to d/c.  Patient currently medically stable for discharge.  Outpatient hemodialysis chair has been arranged.  SNF is pending and that is a barrier to discharge.  Continue case management administration involved.  Disposition plan pending.            LOS: 31 days   Time spent: 35 min with >50%  on coc    Nolberto Hanlon, MD [facility] Pager [phone]  If 7PM-7AM, please contact night-coverage 06/01/2020, 9:27 AM

## 2020-06-01 NOTE — TOC Progression Note (Signed)
Transition of Care [facility]) - Progression Note    Patient Details  Name: Katie [name] MRN: [id number] Date of Birth: [birth year]  Transition of Care [facility]) CM/SW [place], RN Phone Number: 06/01/2020, 12:41 PM  Clinical Narrative:  Attempted to visit patient, who is off floor in Dialysis. RN assigned given contact information for patient to call [place], for information regarding Medicaid.    Expected Discharge Plan: Skilled Nursing Facility Barriers to Discharge: Continued Medical Work up,Homeless with medical needs  Expected Discharge Plan and Services Expected Discharge Plan: [place] In-house Referral: Clinical Social Work Discharge Planning Services: Homebound not met per provider Post Acute Care Choice: [place] Living arrangements for the past 2 months: No permanent address (Lived with Sister and family)                 DME Arranged: N/A DME Agency: NA       HH Arranged: NA HH Agency: NA         Social Determinants of Health (SDOH) Interventions    Readmission Risk Interventions No flowsheet data found.

## 2020-06-01 NOTE — Progress Notes (Signed)
[facility]  ROUNDING NOTE   Subjective:  Patient seen and evaluated during hemodialysis treatment today. Tolerating well.  Objective:  Vital signs in last 24 hours:  Temp:  [98.5 F (36.9 C)-98.6 F (37 C)] 98.5 F (36.9 C) (01/19 1020) Pulse Rate:  [64-75] 65 (01/19 1245) Resp:  [10-18] 10 (01/19 1245) BP: (114-152)/(54-74) 148/73 (01/19 1245) SpO2:  [93 %-100 %] 100 % (01/19 1245) Weight:  [78 kg] 78 kg (01/19 0412)  Weight change: -0.155 kg Filed Weights   05/30/20 0245 05/31/20 0112 06/01/20 0412  Weight: 79.7 kg 78.2 kg 78 kg    Intake/Output: I/O last 3 completed shifts: In: 23 [P.O.:720] Out: 1100 [Urine:1100]   Intake/Output this shift:  Total I/O In: 240 [P.O.:240] Out: -   Physical Exam: General:  Laying in bed, no acute distress  HEENT  normocephalic, atraumatic  Pulm/lungs  clear bilateral  CVS/Heart  S1-S2, no rubs or gallops  Abdomen:   Soft, nontender, nondistended  Extremities:  No lower extremity edema  Neurologic:  Oriented, speech clear and appropriate  Skin:  No acute rashes, or lesions   Access: RIJ permcath 05/16/20 Dr. Lucky Cowboy    Basic Metabolic Panel: Recent Labs  Lab 05/26/20 0553  NA 139  K 4.2  CL 104  CO2 27  GLUCOSE 108*  BUN 43*  CREATININE 2.63*  CALCIUM 8.4*    Liver Function Tests: No results for input(s): AST, ALT, ALKPHOS, BILITOT, PROT, ALBUMIN in the last 168 hours. No results for input(s): LIPASE, AMYLASE in the last 168 hours. No results for input(s): AMMONIA in the last 168 hours.  CBC: Recent Labs  Lab 05/26/20 0553  WBC 6.9  NEUTROABS 3.5  HGB 7.8*  HCT 24.9*  MCV 96.9  PLT 237    Cardiac Enzymes: No results for input(s): CKTOTAL, CKMB, CKMBINDEX, TROPONINI in the last 168 hours.  BNP: Invalid input(s): POCBNP  CBG: Recent Labs  Lab 05/30/20 0043 05/30/20 0753 05/30/20 1600 05/30/20 2002 05/31/20 0810  GLUCAP 101* 97 90 136* 93    Microbiology: Results for orders placed  or performed during the hospital encounter of 04/30/20  Resp Panel by RT-PCR (Flu A&B, Covid) Nasopharyngeal Swab     Status: None   Collection Time: 04/30/20  4:01 PM   Specimen: Nasopharyngeal Swab; Nasopharyngeal(NP) swabs in vial transport medium  Result Value Ref Range Status   SARS Coronavirus 2 by RT PCR NEGATIVE NEGATIVE Final    Comment: (NOTE) SARS-CoV-2 target nucleic acids are NOT DETECTED.  The SARS-CoV-2 RNA is generally detectable in upper respiratory specimens during the acute phase of infection. The lowest concentration of SARS-CoV-2 viral copies this assay can detect is 138 copies/mL. A negative result does not preclude SARS-Cov-2 infection and should not be used as the sole basis for treatment or other patient management decisions. A negative result may occur with  improper specimen collection/handling, submission of specimen other than nasopharyngeal swab, presence of viral mutation(s) within the areas targeted by this assay, and inadequate number of viral copies(<138 copies/mL). A negative result must be combined with clinical observations, patient history, and epidemiological information. The expected result is Negative.  Fact Sheet for Patients:  EntrepreneurPulse.com.au  Fact Sheet for Healthcare Providers:  IncredibleEmployment.be  This test is no t yet approved or cleared by the [place] FDA and  has been authorized for detection and/or diagnosis of SARS-CoV-2 by FDA under an Emergency Use Authorization (EUA). This EUA will remain  in effect (meaning this test can be  used) for the duration of the COVID-19 declaration under Section 564(b)(1) of the Act, 21 U.S.C.section 360bbb-3(b)(1), unless the authorization is terminated  or revoked sooner.       Influenza A by PCR NEGATIVE NEGATIVE Final   Influenza B by PCR NEGATIVE NEGATIVE Final    Comment: (NOTE) The Xpert Xpress SARS-CoV-2/FLU/RSV plus assay is intended  as an aid in the diagnosis of influenza from Nasopharyngeal swab specimens and should not be used as a sole basis for treatment. Nasal washings and aspirates are unacceptable for Xpert Xpress SARS-CoV-2/FLU/RSV testing.  Fact Sheet for Patients: EntrepreneurPulse.com.au  Fact Sheet for Healthcare Providers: IncredibleEmployment.be  This test is not yet approved or cleared by the [place] FDA and has been authorized for detection and/or diagnosis of SARS-CoV-2 by FDA under an Emergency Use Authorization (EUA). This EUA will remain in effect (meaning this test can be used) for the duration of the COVID-19 declaration under Section 564(b)(1) of the Act, 21 U.S.C. section 360bbb-3(b)(1), unless the authorization is terminated or revoked.  Performed at [facility], [address] [place], [place] [address]   Body fluid culture     Status: None   Collection Time: 05/02/20  4:44 PM   Specimen: PATH Cytology Pleural fluid  Result Value Ref Range Status   Specimen Description   Final    PLEURAL Performed at [facility], [address] [place], [place] [address]    Special Requests   Final    NONE Performed at [facility], [address] [place], [place] [address]    Gram Stain   Final    MODERATE WBC PRESENT, PREDOMINANTLY MONONUCLEAR NO ORGANISMS SEEN    Culture   Final    NO GROWTH Performed at [facility], [place] [address] [place], [place] [address]    Report Status 05/06/2020 FINAL  Final  MRSA PCR Screening     Status: None   Collection Time: 05/16/20  9:29 PM   Specimen: Nasal Mucosa; Nasopharyngeal  Result Value Ref Range Status   MRSA by PCR NEGATIVE NEGATIVE Final    Comment:        The GeneXpert MRSA Assay (FDA approved for NASAL specimens only), is one component of a comprehensive MRSA colonization surveillance program. It is not intended to diagnose MRSA infection nor to guide  or monitor treatment for MRSA infections. Performed at [facility], [address] [place],  [address]     Coagulation Studies: No results for input(s): LABPROT, INR in the last 72 hours.  Urinalysis: No results for input(s): COLORURINE, LABSPEC, PHURINE, GLUCOSEU, HGBUR, BILIRUBINUR, KETONESUR, PROTEINUR, UROBILINOGEN, NITRITE, LEUKOCYTESUR in the last 72 hours.  Invalid input(s): APPERANCEUR    Imaging: No results found.   Medications:   . sodium chloride     . amLODipine  10 mg Oral Daily  . benzonatate  200 mg Oral TID  . carvedilol  12.5 mg Oral BID WC  . Chlorhexidine Gluconate Cloth  6 each Topical Q0600  . cholecalciferol  1,000 Units Oral Daily  . epoetin (EPOGEN/PROCRIT) injection  4,000 Units Intravenous Q M,W,F-HD  . feeding supplement  237 mL Oral BID BM  . heparin  5,000 Units Subcutaneous Q8H  . lidocaine  1 patch Transdermal Q24H  . losartan  75 mg Oral Daily  . melatonin  5 mg Oral QPM  . methocarbamol  500 mg Oral QID  . midodrine  10 mg Oral Q M,W,F  . multivitamin  1  tablet Oral QHS  . polyethylene glycol  17 g Oral Daily  . senna-docusate  1 tablet Oral BID  . sevelamer carbonate  800 mg Oral TID WC  . sodium chloride flush  3 mL Intravenous Q12H  . topiramate  25 mg Oral BID  . torsemide  40 mg Oral Daily  . traZODone  50 mg Oral QPM  . vitamin B-12  100 mcg Oral Daily   sodium chloride, acetaminophen **OR** acetaminophen, ALPRAZolam, bisacodyl, chlorpheniramine-HYDROcodone, ondansetron **OR** ondansetron (ZOFRAN) IV, oxyCODONE-acetaminophen, promethazine, sodium chloride flush, SUMAtriptan  Assessment/ Plan:  Katie [name] is a 65 y.o. white female with diastolic congestive heart failure, hypertension, diabetes mellitus type II who is admitted to [facility] on 04/30/2020 for Chronic kidney disease (CKD), stage IV (severe) ([place]) [N18.4] Volume overload [E87.70]  Patient initiated on hemodialysis this admission.   1. End  stage renal disease requiring dialysis Mondays and Fridays  Secondary to hypertension and atherosclerosis.   History of nonsteroidal use  -Pt seen during HD, tolerating well, we plan to complete HD today.  2. Hypertension with lower extremity edema:  Challenging situation as she has some element of autonomic neuropathy and orthostatic hypotension. Reporting significant dizziness today. We will administer midodrine on dialysis days.  3. Anemia of chronic kidney disease:  Lab Results  Component Value Date   HGB 7.8 (L) 05/26/2020  Continue Epogen 4000 units with dialysis treatments  4. Secondary hyperparathyroidism Lab Results  Component Value Date   PTH 70 (H) 05/12/2020   CALCIUM 8.4 (L) 05/26/2020   PHOS 4.2 05/21/2020  Continue to monitor bone mineral metabolism.   5.  Diabetes type 2 with CKD Glycemic control per hospitalist.    LOS: 31 Katie [name] 1/19/20221:24 PM

## 2020-06-01 NOTE — Progress Notes (Signed)
Mobility Specialist - Progress Note   06/01/20 1700  Mobility  Activity Refused mobility  Mobility performed by Mobility specialist    Pt declined mobility this date d/t recent session with PT. Will attempt session at another date.    Kathee Delton Mobility Specialist 06/01/20, 5:38 PM

## 2020-06-01 NOTE — Progress Notes (Signed)
PT Cancellation Note  Patient Details Name: Katie [name] MRN: [id number] DOB: 12/24/1955   Cancelled Treatment:     PT attempt. Pt off floor in HD. Will return later this date and continue to follow pt per POC.   Willette Pa 06/01/2020, 10:23 AM

## 2020-06-01 NOTE — Progress Notes (Signed)
Administered 2 ml of heparin in bloodlines to prevent clotting RN and doctor aware and lines are reversed due to high AP.

## 2020-06-01 NOTE — Progress Notes (Addendum)
Physical Therapy Treatment Patient Details Name: Katie [name] MRN: [id number] DOB: 03-15-1956 Today's Date: 06/01/2020    History of Present Illness Patient is a 65 y.o. female with a history of diabetes mellitus type 2, stage IV CKD, heart failure, hypertension. Patient presented secondary to dyspnea with evidence of volume overload and pleural effusion. Diagnosed with moderate pericardial effusion, mild to moderate aortic insufficiency and aortic stenosis.  S/p R heart cath 12/28.  R temp fem cath placed 12/29; 1/3 R IJ permcath placed; 1/4 R temp fem cath removed.    PT Comments    Pt was supine in bed with HOB elevated ~ 15 degrees. She agrees to PT session. BP at rest at rest 131/59. Only required supervision to progress to EOB. BP upon sitting up EOB 116/57. No symptoms of dizziness or lightheadedness. After two minutes sitting EOB BP 102/68. Pt requested to attempt standing. Stood with supervision only. BP in standing 84/52. She c/o severe dizziness/lightheadedness and returned to sitting. Performed EOB there ex and supine there ex prior to conclusion of session. Recommend DC to SNF     Follow Up Recommendations  SNF;Other (comment);Home health PT (DC to SNF if orthostatic hypotension symptoms do not  improve. HHPT)     Equipment Recommendations  Rolling walker with 5" wheels;3in1 (PT)    Recommendations for Other Services       Precautions / Restrictions Precautions Precautions: Fall Precaution Comments: R IJ permcath Restrictions Weight Bearing Restrictions: No    Mobility  Bed Mobility Overal bed mobility: Needs Assistance Bed Mobility: Supine to Sit;Sit to Supine     Supine to sit: Supervision;HOB elevated Sit to supine: Supervision;HOB elevated   General bed mobility comments: BP in supine131/59  Transfers Overall transfer level: Needs assistance Equipment used: Rolling walker (2 wheeled) Transfers: Sit to/from Stand Sit to Stand: From elevated surface;Min  guard Stand pivot transfers: Supervision       General transfer comment: pt easily able to stand EOB x 3 minutes however then does endorse dizziness and needed to sit. BP in standing dropped to 84/52.  Ambulation/Gait    General Gait Details: unable to safely progress to ambulation 2/2 to orthostatic hypotensive concerns       Balance Overall balance assessment: Needs assistance Sitting-balance support: Feet supported Sitting balance-Leahy Scale: Good Sitting balance - Comments: no LOB sitting EOB   Standing balance support: Bilateral upper extremity supported Standing balance-Leahy Scale: Fair       Cognition Arousal/Alertness: Awake/alert Behavior During Therapy: WFL for tasks assessed/performed Overall Cognitive Status: Within Functional Limits for tasks assessed        General Comments: Pt is A and O x 4. cooperative and pleasant throughout      Exercises General Exercises - Lower Extremity Ankle Circles/Pumps: AROM;Strengthening;Both;Seated Quad Sets: AROM;10 reps Gluteal Sets: AROM;10 reps Heel Slides: AROM;10 reps Straight Leg Raises: AROM;10 reps        Pertinent Vitals/Pain Pain Assessment: No/denies pain Pain Score: 0-No pain           PT Goals (current goals can now be found in the care plan section) Acute Rehab PT Goals Patient Stated Goal: to go home Progress towards PT goals: Progressing toward goals    Frequency    Min 2X/week      PT Plan Current plan remains appropriate       AM-PAC PT "6 Clicks" Mobility   Outcome Measure  Help needed turning from your back to your side while in a flat bed without  using bedrails?: A Little Help needed moving from lying on your back to sitting on the side of a flat bed without using bedrails?: A Little Help needed moving to and from a bed to a chair (including a wheelchair)?: A Little Help needed standing up from a chair using your arms (e.g., wheelchair or bedside chair)?: A Little Help  needed to walk in hospital room?: A Lot Help needed climbing 3-5 steps with a railing? : A Lot 6 Click Score: 16    End of Session Equipment Utilized During Treatment: Gait belt Activity Tolerance: Patient tolerated treatment well;Treatment limited secondary to medical complications (Comment) (limited by orthostatic hypotension) Patient left: in bed;with bed alarm set;Other (comment) Nurse Communication: Mobility status;Precautions PT Visit Diagnosis: Unsteadiness on feet (R26.81);Muscle weakness (generalized) (M62.81);History of falling (Z91.81);Other abnormalities of gait and mobility (R26.89)     Time: [id number] PT Time Calculation (min) (ACUTE ONLY): 25 min  Charges:  $Therapeutic Exercise: 8-22 mins $Therapeutic Activity: 8-22 mins                     Julaine Fusi PTA 06/01/20, 4:29 PM

## 2020-06-02 NOTE — Progress Notes (Signed)
[facility]  ROUNDING NOTE   Subjective:  Patient seen and evaluated at bedside. Due for hemodialysis treatment again tomorrow.  Objective:  Vital signs in last 24 hours:  Temp:  [98.2 F (36.8 C)-98.9 F (37.2 C)] 98.3 F (36.8 C) (01/20 1222) Pulse Rate:  [67-75] 69 (01/20 1222) Resp:  [17-20] 20 (01/20 1222) BP: (106-124)/(51-61) 121/51 (01/20 1222) SpO2:  [94 %-96 %] 95 % (01/20 1222) Weight:  [75.7 kg] 75.7 kg (01/20 0201)  Weight change: -2.3 kg Filed Weights   06/01/20 0412 06/01/20 1654 06/02/20 0201  Weight: 78 kg 75.7 kg 75.7 kg    Intake/Output: I/O last 3 completed shifts: In: 64 [P.O.:720] Out: 2100 [Urine:1100; Other:1000]   Intake/Output this shift:  Total I/O In: 360 [P.O.:360] Out: -   Physical Exam: General:  Laying in bed, no acute distress  HEENT  normocephalic, atraumatic  Pulm/lungs  clear bilateral  CVS/Heart  S1-S2, no rubs or gallops  Abdomen:   Soft, nontender, nondistended  Extremities:  No lower extremity edema  Neurologic:  Oriented, speech clear and appropriate  Skin:  No acute rashes, or lesions   Access: RIJ permcath 05/16/20 Dr. Lucky Cowboy    Basic Metabolic Panel: Recent Labs  Lab 06/01/20 1853  PHOS 3.6    Liver Function Tests: No results for input(s): AST, ALT, ALKPHOS, BILITOT, PROT, ALBUMIN in the last 168 hours. No results for input(s): LIPASE, AMYLASE in the last 168 hours. No results for input(s): AMMONIA in the last 168 hours.  CBC: No results for input(s): WBC, NEUTROABS, HGB, HCT, MCV, PLT in the last 168 hours.  Cardiac Enzymes: No results for input(s): CKTOTAL, CKMB, CKMBINDEX, TROPONINI in the last 168 hours.  BNP: Invalid input(s): POCBNP  CBG: Recent Labs  Lab 05/30/20 0753 05/30/20 1600 05/30/20 2002 05/31/20 0810 06/01/20 2118  GLUCAP 97 90 136* 93 116*    Microbiology: Results for orders placed or performed during the hospital encounter of 04/30/20  Resp Panel by RT-PCR (Flu  A&B, Covid) Nasopharyngeal Swab     Status: None   Collection Time: 04/30/20  4:01 PM   Specimen: Nasopharyngeal Swab; Nasopharyngeal(NP) swabs in vial transport medium  Result Value Ref Range Status   SARS Coronavirus 2 by RT PCR NEGATIVE NEGATIVE Final    Comment: (NOTE) SARS-CoV-2 target nucleic acids are NOT DETECTED.  The SARS-CoV-2 RNA is generally detectable in upper respiratory specimens during the acute phase of infection. The lowest concentration of SARS-CoV-2 viral copies this assay can detect is 138 copies/mL. A negative result does not preclude SARS-Cov-2 infection and should not be used as the sole basis for treatment or other patient management decisions. A negative result may occur with  improper specimen collection/handling, submission of specimen other than nasopharyngeal swab, presence of viral mutation(s) within the areas targeted by this assay, and inadequate number of viral copies(<138 copies/mL). A negative result must be combined with clinical observations, patient history, and epidemiological information. The expected result is Negative.  Fact Sheet for Patients:  EntrepreneurPulse.com.au  Fact Sheet for Healthcare Providers:  IncredibleEmployment.be  This test is no t yet approved or cleared by the [place] FDA and  has been authorized for detection and/or diagnosis of SARS-CoV-2 by FDA under an Emergency Use Authorization (EUA). This EUA will remain  in effect (meaning this test can be used) for the duration of the COVID-19 declaration under Section 564(b)(1) of the Act, 21 U.S.C.section 360bbb-3(b)(1), unless the authorization is terminated  or revoked sooner.  Influenza A by PCR NEGATIVE NEGATIVE Final   Influenza B by PCR NEGATIVE NEGATIVE Final    Comment: (NOTE) The Xpert Xpress SARS-CoV-2/FLU/RSV plus assay is intended as an aid in the diagnosis of influenza from Nasopharyngeal swab specimens  and should not be used as a sole basis for treatment. Nasal washings and aspirates are unacceptable for Xpert Xpress SARS-CoV-2/FLU/RSV testing.  Fact Sheet for Patients: EntrepreneurPulse.com.au  Fact Sheet for Healthcare Providers: IncredibleEmployment.be  This test is not yet approved or cleared by the [place] FDA and has been authorized for detection and/or diagnosis of SARS-CoV-2 by FDA under an Emergency Use Authorization (EUA). This EUA will remain in effect (meaning this test can be used) for the duration of the COVID-19 declaration under Section 564(b)(1) of the Act, 21 U.S.C. section 360bbb-3(b)(1), unless the authorization is terminated or revoked.  Performed at [facility], [address] [place], [place] [address]   Body fluid culture     Status: None   Collection Time: 05/02/20  4:44 PM   Specimen: PATH Cytology Pleural fluid  Result Value Ref Range Status   Specimen Description   Final    PLEURAL Performed at [facility], [address] [place], [place] [address]    Special Requests   Final    NONE Performed at [facility], [address] [place], [place] [address]    Gram Stain   Final    MODERATE WBC PRESENT, PREDOMINANTLY MONONUCLEAR NO ORGANISMS SEEN    Culture   Final    NO GROWTH Performed at [facility], [place] [address] [place], [place] [address]    Report Status 05/06/2020 FINAL  Final  MRSA PCR Screening     Status: None   Collection Time: 05/16/20  9:29 PM   Specimen: Nasal Mucosa; Nasopharyngeal  Result Value Ref Range Status   MRSA by PCR NEGATIVE NEGATIVE Final    Comment:        The GeneXpert MRSA Assay (FDA approved for NASAL specimens only), is one component of a comprehensive MRSA colonization surveillance program. It is not intended to diagnose MRSA infection nor to guide or monitor treatment for MRSA infections. Performed at [facility], [address] [place], [place] [address]     Coagulation Studies: No results for input(s): LABPROT, INR in the last 72 hours.  Urinalysis: No results for input(s): COLORURINE, LABSPEC, PHURINE, GLUCOSEU, HGBUR, BILIRUBINUR, KETONESUR, PROTEINUR, UROBILINOGEN, NITRITE, LEUKOCYTESUR in the last 72 hours.  Invalid input(s): APPERANCEUR    Imaging: No results found.   Medications:   . sodium chloride     . amLODipine  10 mg Oral Daily  . benzonatate  200 mg Oral TID  . carvedilol  12.5 mg Oral BID WC  . Chlorhexidine Gluconate Cloth  6 each Topical Q0600  . cholecalciferol  1,000 Units Oral Daily  . epoetin (EPOGEN/PROCRIT) injection  4,000 Units Intravenous Q M,W,F-HD  . feeding supplement  237 mL Oral BID BM  . heparin  5,000 Units Subcutaneous Q8H  . lidocaine  1 patch Transdermal Q24H  . losartan  75 mg Oral Daily  . melatonin  5 mg Oral QPM  . methocarbamol  500 mg Oral QID  . midodrine  10 mg Oral Q M,W,F  . multivitamin  1 tablet Oral QHS  . polyethylene glycol  17 g Oral Daily  . senna-docusate  1 tablet Oral BID  . sevelamer carbonate  800 mg Oral TID WC  .  sodium chloride flush  3 mL Intravenous Q12H  . topiramate  25 mg Oral BID  . torsemide  40 mg Oral Daily  . traZODone  50 mg Oral QPM  . vitamin B-12  100 mcg Oral Daily   sodium chloride, acetaminophen **OR** acetaminophen, ALPRAZolam, bisacodyl, chlorpheniramine-HYDROcodone, ondansetron **OR** ondansetron (ZOFRAN) IV, oxyCODONE-acetaminophen, promethazine, sodium chloride flush, SUMAtriptan  Assessment/ Plan:  [name] is a 65 y.o. white female with diastolic congestive heart failure, hypertension, diabetes mellitus type II who is admitted to [facility] on 04/30/2020 for Chronic kidney disease (CKD), stage IV (severe) ([place]) [N18.4] Volume overload [E87.70]  Patient initiated on hemodialysis this admission.   1. End stage renal disease requiring dialysis Mondays and Fridays  Secondary to  hypertension and atherosclerosis.   History of nonsteroidal use  -No acute indication for dialysis today.  We will plan for hemodialysis again tomorrow.  2. Hypertension with lower extremity edema:  Challenging situation as she has some element of autonomic neuropathy and orthostatic hypotension.  Has been reporting dizziness. -Started on midodrine on dialysis days.  3. Anemia of chronic kidney disease:  Lab Results  Component Value Date   HGB 7.8 (L) 05/26/2020  Continue Epogen 4000 units with dialysis treatments  4. Secondary hyperparathyroidism Lab Results  Component Value Date   PTH 70 (H) 05/12/2020   CALCIUM 8.4 (L) 05/26/2020   PHOS 3.6 06/01/2020  Phosphorus at target at 3.6.   5.  Diabetes type 2 with CKD Glycemic control per hospitalist.  6.  Disposition. Does not have a safe living environment.  Search for rehabilitation facility to be expanded by case management.   LOS: 32 Kamdin Follett 1/20/20225:24 PM

## 2020-06-02 NOTE — Progress Notes (Signed)
PT Cancellation Note  Patient Details Name: Katie [name] MRN: [id number] DOB: 1956-04-10   Cancelled Treatment:     PT attempt. Upon arriving to room, pt reports she just called RN staff to be cleaned up after soiling herself. She also reports they will be transferring her to 1st floor. Pt requested therapist return in morning. Pt does have HD tomorrow.Will work around HD schedule to see pt and progress OOB activity if orthostatic hypotension improves.    Willette Pa 06/02/2020, 3:03 PM

## 2020-06-02 NOTE — Progress Notes (Signed)
PROGRESS NOTE    Katie [name]  [id number] DOB: [birth year] DOA: 04/30/2020 PCP: Patient, [name]    Brief Narrative:  Katie [name] a 65 y.o.female with a history of diabetes mellitus type 2, stage IV CKD, heart failure, hypertension. Patient presented secondary to dyspnea with evidence of volume overload and pleural effusion.  Patient has had an extended hospital course.  Her acute medical issues have essentially resolved at this point.  She is medically stable for discharge however we do not have a safe disposition plan.  Patient does not have stable living and lives with a friend here.  She states that the friend likely will not be able to care for her.  TOC working to find placement however multiple facilities have refused at this point.  Senior case management involved.  Expanding search.  Patient remains stable.  Continues to be very unmotivated always having pain complaints when PT attempted to work with her however then later complains of buttock pain from sitting too much.  I try to reinforce the need for exertion and ambulation daily basis.   1/19- Had HD today. +orthostatics on PT evaluation today. Pt reports being symptomatic when bp drops  1/20-has no complaints this am. No overnight issues.   Consultants:   nephrology  Procedures: HD  Antimicrobials:       Subjective: No complaints of sob, cp, dizziness  Objective: Vitals:   06/02/20 0201 06/02/20 0421 06/02/20 0533 06/02/20 0751  BP:  124/61 (!) 117/53 (!) 116/53  Pulse:  67 71 71  Resp:  '17 18 20  '$ Temp:  98.4 F (36.9 C) 98.2 F (36.8 C) 98.8 F (37.1 C)  TempSrc:  Oral Oral Oral  SpO2:  95% 96% 96%  Weight: 75.7 kg     Height:        Intake/Output Summary (Last 24 hours) at 06/02/2020 0828 Last data filed at 06/01/2020 1717 Gross per 24 hour  Intake 720 ml  Output 1600 ml  Net -880 ml   Filed Weights   06/01/20 0412 06/01/20 1654 06/02/20 0201  Weight: 78 kg 75.7 kg 75.7 kg     Examination: Calm, nad cta no w/r/r Regular s1/s2 Benign, +bs No edema Aaxox3, grossly intact Mood and affect appropriate in current setting   Data Reviewed: I have personally reviewed following labs and imaging studies  CBC: No results for input(s): WBC, NEUTROABS, HGB, HCT, MCV, PLT in the last 168 hours. Basic Metabolic Panel: Recent Labs  Lab 06/01/20 1853  PHOS 3.6   GFR: Estimated Creatinine Clearance: 23.1 mL/min (A) (by C-G formula based on SCr of 2.63 mg/dL (H)). Liver Function Tests: No results for input(s): AST, ALT, ALKPHOS, BILITOT, PROT, ALBUMIN in the last 168 hours. No results for input(s): LIPASE, AMYLASE in the last 168 hours. No results for input(s): AMMONIA in the last 168 hours. Coagulation Profile: No results for input(s): INR, PROTIME in the last 168 hours. Cardiac Enzymes: No results for input(s): CKTOTAL, CKMB, CKMBINDEX, TROPONINI in the last 168 hours. BNP (last 3 results) No results for input(s): PROBNP in the last 8760 hours. HbA1C: No results for input(s): HGBA1C in the last 72 hours. CBG: Recent Labs  Lab 05/30/20 0753 05/30/20 1600 05/30/20 2002 05/31/20 0810 06/01/20 2118  GLUCAP 97 90 136* 93 116*   Lipid Profile: No results for input(s): CHOL, HDL, LDLCALC, TRIG, CHOLHDL, LDLDIRECT in the last 72 hours. Thyroid Function Tests: No results for input(s): TSH, T4TOTAL, FREET4, T3FREE, THYROIDAB in the last 72 hours.  Anemia Panel: No results for input(s): VITAMINB12, FOLATE, FERRITIN, TIBC, IRON, RETICCTPCT in the last 72 hours. Sepsis Labs: No results for input(s): PROCALCITON, LATICACIDVEN in the last 168 hours.  No results found for this or any previous visit (from the past 240 hour(s)).       Radiology Studies: No results found.      Scheduled Meds: . amLODipine  10 mg Oral Daily  . benzonatate  200 mg Oral TID  . carvedilol  12.5 mg Oral BID WC  . Chlorhexidine Gluconate Cloth  6 each Topical Q0600  .  cholecalciferol  1,000 Units Oral Daily  . epoetin (EPOGEN/PROCRIT) injection  4,000 Units Intravenous Q M,W,F-HD  . feeding supplement  237 mL Oral BID BM  . heparin  5,000 Units Subcutaneous Q8H  . lidocaine  1 patch Transdermal Q24H  . losartan  75 mg Oral Daily  . melatonin  5 mg Oral QPM  . methocarbamol  500 mg Oral QID  . midodrine  10 mg Oral Q M,W,F  . multivitamin  1 tablet Oral QHS  . polyethylene glycol  17 g Oral Daily  . senna-docusate  1 tablet Oral BID  . sevelamer carbonate  800 mg Oral TID WC  . sodium chloride flush  3 mL Intravenous Q12H  . topiramate  25 mg Oral BID  . torsemide  40 mg Oral Daily  . traZODone  50 mg Oral QPM  . vitamin B-12  100 mcg Oral Daily   Continuous Infusions: . sodium chloride      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)   Hyperkalemia   Pressure injury of skin   Pleural effusion   Acute heart failure with preserved ejection fraction (HFpEF) (HCC)   Chronic kidney disease (CKD), stage IV (severe) (HCC)   Pulmonary hypertension, unspecified (HCC)   Volume overload In setting of CKD. Cardiomegaly on chest x-ray.  Started on Lasix IV on admission but has now progressed to requiring hemodialysis. CKD likely progressed to ESRD 1/20-HD yesterday. Euvolemic on exam Has permacath placed to Rt chest Patient dialysis chair set up warm patient is discharged     Pleural effusion In setting of fluid overload with new diagnosis of grade 2 diastolic dysfunction EF normal on echo 05/01/20 Status post diagnostic right thoracentesis performed on 12/20 No evidence of malignancy on cytology     Diabetes mellitus, type 2 Hemoglobin A1C of 4.7%.  BG controlled No need for pharmacologic treatment Diet controlled   ESRD Unknown baseline. 2/2 HTN and  atherosclerosis Hx/o NSADS Patient is unsure of her primary nephrologist in [place].  Creatinine of 2.97 on admission.  Per patient, she was planned to start hemodialysis in addition to consideration for renal transplant.  Nephrology consulted on 12/20.  Worsening renal function in setting of attempted Lasix diuresis for heart failure. Nephrology now started hemodialysis. 1/20-getting hemodialysis on Monday Wednesday Friday Expected [facility] Pending financial clearance for discharge planning TOC attempting disposition to SNF Nephrology following       Acute diastolic heart failure New diagnosis. Grade 2 dysfunction. Normal EF.  Associated RV systolic dysfunction with elevated PA pressure seen on TTE although RHC on 12/28 significant for mildly elevated right heart/pulmonary artery pressures.  1/20-Resolved euvolemic  Continue torsemide per nephrology's recommendation Daily weight I's and  O's Check a.m. labs     Moderate pericardial effusion No clinical evidence of tamponade.  No recent infections.  In setting of CKD V with elevated BUN. No chest pain. -cardiology rec. Outpatient Echo for surveillance Hemodynamics stable , continue to monitor   Mild/moderate aortic stenosis Unsure if this is symptomatic.  Patient does have dyspnea with exertion but this may be multifactorial. -Cardiology recommendations: outpatient Transthoracic Echocardiogram for surveillance  Primary hypertension Patient is on at least hydrochlorothiazide as an outpatient but is unsure of her regimen. Improved control -Losartan, carvedilol, amlodipine -Lasix as above  Anemia Possible related to underlying kidney disease.  Evidence of macrocytosis on CBC.  TIBC low but otherwise normal iron panel. Vitamin B12 level 358, target > 400, started oral supplement and folate normal.  Hyperkalemia In setting of CKD. Lokelma 10 g given with improvement.  Pressure injury Mid  coccyx, POA  History of tobacco use Patient has quit for at least 10 years. Previously, 20 pack year history.  Headache Normal chronic headache. Patient states she takes Imitrex as needed.  Continue Tylenol and oxycodone as needed 1/8 s/p Fioricet 1 dose 1/9 IV Solu-Medrol 60 mg x 1 dose, Benadryl 25 mg IV x1 dose and Phenergan 25 mg IV x1 dose given 1/10Headache improved,patient slept well it seems insomnia is contributing to headache  Insomnia, started trazodone and melatonin Continue melatonin and trazodone nightly,seems to be helping patient to sleep   Obesity Body mass index is 30.91 kg/m.   DVT prophylaxis: Heparin Code Status: Full Family Communication: None at bedside  Status is: Inpatient  Remains inpatient appropriate because:Unsafe d/c plan   Dispo: The patient is from: Home              Anticipated d/c is to: SNF              Anticipated d/c date is: > 3 days              Patient currently is medically stable to d/c.  Patient currently medically stable for discharge.  Outpatient hemodialysis chair has been arranged.  SNF is pending and that is a barrier to discharge.  Continue case management administration involved.  Disposition plan pending.            LOS: 32 days   Time spent: 35 min with >50% on coc    Nolberto Hanlon, MD [facility] Pager [phone]  If 7PM-7AM, please contact night-coverage 06/02/2020, 8:28 AM

## 2020-06-03 LAB — BASIC METABOLIC PANEL
Anion gap: 8 (ref 5–15)
BUN: 19 mg/dL (ref 8–23)
CO2: 29 mmol/L (ref 22–32)
Calcium: 7.9 mg/dL — ABNORMAL LOW (ref 8.9–10.3)
Chloride: 102 mmol/L (ref 98–111)
Creatinine, Ser: 1.48 mg/dL — ABNORMAL HIGH (ref 0.44–1.00)
GFR, Estimated: 40 mL/min — ABNORMAL LOW (ref >60)
Glucose, Bld: 126 mg/dL — ABNORMAL HIGH (ref 70–99)
Potassium: 3.8 mmol/L (ref 3.5–5.1)
Sodium: 139 mmol/L (ref 135–145)

## 2020-06-03 NOTE — Progress Notes (Signed)
[facility]  ROUNDING NOTE   Subjective:  Patient underwent hemodialysis treatment today. Tolerated well.  Objective:  Vital signs in last 24 hours:  Temp:  [97.7 F (36.5 C)-99 F (37.2 C)] 97.8 F (36.6 C) (01/21 1505) Pulse Rate:  [65-74] 74 (01/21 1505) Resp:  [0-23] 16 (01/21 1505) BP: (106-180)/(45-120) 114/45 (01/21 1505) SpO2:  [95 %-99 %] 95 % (01/21 1505) Weight:  [75.9 kg] 75.9 kg (01/21 0539)  Weight change: 0.2 kg Filed Weights   06/01/20 1654 06/02/20 0201 06/03/20 0539  Weight: 75.7 kg 75.7 kg 75.9 kg    Intake/Output: I/O last 3 completed shifts: In: 360 [P.O.:360] Out: -    Intake/Output this shift:  Total I/O In: -  Out: 917 [Other:917]  Physical Exam: General:  Laying in bed, no acute distress  HEENT  normocephalic, atraumatic  Pulm/lungs  clear bilateral  CVS/Heart  S1-S2, no rubs or gallops  Abdomen:   Soft, nontender, nondistended  Extremities:  No lower extremity edema  Neurologic:  Oriented, speech clear and appropriate  Skin:  No acute rashes, or lesions   Access: RIJ permcath 05/16/20 Dr. Lucky Cowboy    Basic Metabolic Panel: Recent Labs  Lab 06/01/20 1853 06/03/20 1242  NA  --  139  K  --  3.8  CL  --  102  CO2  --  29  GLUCOSE  --  126*  BUN  --  19  CREATININE  --  1.48*  CALCIUM  --  7.9*  PHOS 3.6  --     Liver Function Tests: No results for input(s): AST, ALT, ALKPHOS, BILITOT, PROT, ALBUMIN in the last 168 hours. No results for input(s): LIPASE, AMYLASE in the last 168 hours. No results for input(s): AMMONIA in the last 168 hours.  CBC: No results for input(s): WBC, NEUTROABS, HGB, HCT, MCV, PLT in the last 168 hours.  Cardiac Enzymes: No results for input(s): CKTOTAL, CKMB, CKMBINDEX, TROPONINI in the last 168 hours.  BNP: Invalid input(s): POCBNP  CBG: Recent Labs  Lab 05/30/20 0753 05/30/20 1600 05/30/20 2002 05/31/20 0810 06/01/20 2118  GLUCAP 97 90 136* 93 116*    Microbiology: Results  for orders placed or performed during the hospital encounter of 04/30/20  Resp Panel by RT-PCR (Flu A&B, Covid) Nasopharyngeal Swab     Status: None   Collection Time: 04/30/20  4:01 PM   Specimen: Nasopharyngeal Swab; Nasopharyngeal(NP) swabs in vial transport medium  Result Value Ref Range Status   SARS Coronavirus 2 by RT PCR NEGATIVE NEGATIVE Final    Comment: (NOTE) SARS-CoV-2 target nucleic acids are NOT DETECTED.  The SARS-CoV-2 RNA is generally detectable in upper respiratory specimens during the acute phase of infection. The lowest concentration of SARS-CoV-2 viral copies this assay can detect is 138 copies/mL. A negative result does not preclude SARS-Cov-2 infection and should not be used as the sole basis for treatment or other patient management decisions. A negative result may occur with  improper specimen collection/handling, submission of specimen other than nasopharyngeal swab, presence of viral mutation(s) within the areas targeted by this assay, and inadequate number of viral copies(<138 copies/mL). A negative result must be combined with clinical observations, patient history, and epidemiological information. The expected result is Negative.  Fact Sheet for Patients:  EntrepreneurPulse.com.au  Fact Sheet for Healthcare Providers:  IncredibleEmployment.be  This test is no t yet approved or cleared by the [place] FDA and  has been authorized for detection and/or diagnosis of SARS-CoV-2 by FDA  under an Emergency Use Authorization (EUA). This EUA will remain  in effect (meaning this test can be used) for the duration of the COVID-19 declaration under Section 564(b)(1) of the Act, 21 U.S.C.section 360bbb-3(b)(1), unless the authorization is terminated  or revoked sooner.       Influenza A by PCR NEGATIVE NEGATIVE Final   Influenza B by PCR NEGATIVE NEGATIVE Final    Comment: (NOTE) The Xpert Xpress SARS-CoV-2/FLU/RSV plus  assay is intended as an aid in the diagnosis of influenza from Nasopharyngeal swab specimens and should not be used as a sole basis for treatment. Nasal washings and aspirates are unacceptable for Xpert Xpress SARS-CoV-2/FLU/RSV testing.  Fact Sheet for Patients: EntrepreneurPulse.com.au  Fact Sheet for Healthcare Providers: IncredibleEmployment.be  This test is not yet approved or cleared by the [place] FDA and has been authorized for detection and/or diagnosis of SARS-CoV-2 by FDA under an Emergency Use Authorization (EUA). This EUA will remain in effect (meaning this test can be used) for the duration of the COVID-19 declaration under Section 564(b)(1) of the Act, 21 U.S.C. section 360bbb-3(b)(1), unless the authorization is terminated or revoked.  Performed at [facility], [address] [place], [place] [address]   Body fluid culture     Status: None   Collection Time: 05/02/20  4:44 PM   Specimen: PATH Cytology Pleural fluid  Result Value Ref Range Status   Specimen Description   Final    PLEURAL Performed at [facility], [address] [place], [place] [address]    Special Requests   Final    NONE Performed at [facility], [address] [place], [place] [address]    Gram Stain   Final    MODERATE WBC PRESENT, PREDOMINANTLY MONONUCLEAR NO ORGANISMS SEEN    Culture   Final    NO GROWTH Performed at [facility], Time [address], [place], [place] [address]    Report Status 05/06/2020 FINAL  Final  MRSA PCR Screening     Status: None   Collection Time: 05/16/20  9:29 PM   Specimen: Nasal Mucosa; Nasopharyngeal  Result Value Ref Range Status   MRSA by PCR NEGATIVE NEGATIVE Final    Comment:        The GeneXpert MRSA Assay (FDA approved for NASAL specimens only), is one component of a comprehensive MRSA colonization surveillance program. It is not intended to diagnose  MRSA infection nor to guide or monitor treatment for MRSA infections. Performed at [facility], [address] [place], [place] [address]     Coagulation Studies: No results for input(s): LABPROT, INR in the last 72 hours.  Urinalysis: No results for input(s): COLORURINE, LABSPEC, PHURINE, GLUCOSEU, HGBUR, BILIRUBINUR, KETONESUR, PROTEINUR, UROBILINOGEN, NITRITE, LEUKOCYTESUR in the last 72 hours.  Invalid input(s): APPERANCEUR    Imaging: No results found.   Medications:    sodium chloride      amLODipine  10 mg Oral Daily   benzonatate  200 mg Oral TID   carvedilol  12.5 mg Oral BID WC   Chlorhexidine Gluconate Cloth  6 each Topical Q0600   cholecalciferol  1,000 Units Oral Daily   epoetin (EPOGEN/PROCRIT) injection  4,000 Units Intravenous Q M,W,F-HD   feeding supplement  237 mL Oral BID BM   heparin  5,000 Units Subcutaneous Q8H   lidocaine  1 patch Transdermal Q24H   losartan  75 mg Oral Daily   melatonin  5 mg Oral QPM   methocarbamol  500 mg Oral QID   midodrine  10 mg Oral Q M,W,F   multivitamin  1 tablet Oral QHS   polyethylene glycol  17 g Oral Daily   senna-docusate  1 tablet Oral BID   sevelamer carbonate  800 mg Oral TID WC   sodium chloride flush  3 mL Intravenous Q12H   topiramate  25 mg Oral BID   torsemide  40 mg Oral Daily   traZODone  50 mg Oral QPM   vitamin B-12  100 mcg Oral Daily   sodium chloride, acetaminophen **OR** acetaminophen, ALPRAZolam, bisacodyl, chlorpheniramine-HYDROcodone, ondansetron **OR** ondansetron (ZOFRAN) IV, oxyCODONE-acetaminophen, promethazine, sodium chloride flush, SUMAtriptan  Assessment/ Plan:  Katie [name] is a 65 y.o. white female with diastolic congestive heart failure, hypertension, diabetes mellitus type II who is admitted to [facility] on 04/30/2020 for Chronic kidney disease (CKD), stage IV (severe) ([place]) [N18.4] Volume overload [E87.70]  Patient initiated on hemodialysis  this admission.   1. End stage renal disease requiring dialysis Mondays and Fridays  Secondary to hypertension and atherosclerosis.   History of nonsteroidal use  -Pt completed HD today, will plan for HD again on Monday.  2. Hypertension with lower extremity edema:  Challenging situation as she has some element of autonomic neuropathy and orthostatic hypotension.  Has been reporting dizziness. -Started on midodrine on dialysis days.  3. Anemia of chronic kidney disease:  Lab Results  Component Value Date   HGB 7.8 (L) 05/26/2020  Maintain the pt on epogen 4000 units with HD.   4. Secondary hyperparathyroidism Lab Results  Component Value Date   PTH 70 (H) 05/12/2020   CALCIUM 7.9 (L) 06/03/2020   PHOS 3.6 06/01/2020  Phosphorus at target at 3.6.  Continue to periodically monitor.   5.  Diabetes type 2 with CKD Glycemic control per hospitalist.  6.  Disposition. Does not have a safe living environment.  Search for rehabilitation facility to be expanded by case management.   LOS: 42 Tareva Leske 1/21/20223:19 PM

## 2020-06-03 NOTE — Progress Notes (Signed)
PT Cancellation Note  Patient Details Name: Katie [name] MRN: [id number] DOB: 05-Nov-1955   Cancelled Treatment:     PT attempt. Pt is off floor for HD. Will return later this afternoon.    Willette Pa 06/03/2020, 11:21 AM

## 2020-06-03 NOTE — Progress Notes (Signed)
PROGRESS NOTE    Katie [name]  [id number] DOB: Jul 23, 1955 DOA: 04/30/2020 PCP: Patient, [name]    Brief Narrative:  Katie [name] a 65 y.o.female with a history of diabetes mellitus type 2, stage IV CKD, heart failure, hypertension. Patient presented secondary to dyspnea with evidence of volume overload and pleural effusion.  Patient has had an extended hospital course.  Her acute medical issues have essentially resolved at this point.  She is medically stable for discharge however we do not have a safe disposition plan.  Patient does not have stable living and lives with a friend here.  She states that the friend likely will not be able to care for her.  TOC working to find placement however multiple facilities have refused at this point.  Senior case management involved.  Expanding search.  Patient remains stable.  Continues to be very unmotivated always having pain complaints when PT attempted to work with her however then later complains of buttock pain from sitting too much.  I try to reinforce the need for exertion and ambulation daily basis.   1/19- Had HD today. +orthostatics on PT evaluation today. Pt reports being symptomatic when bp drops  1/20-has no complaints this am. No overnight issues. 1/21-pt seen in HD today. Has no complaints.   Consultants:   nephrology  Procedures: HD  Antimicrobials:       Subjective: No shortness of breath or chest pain reported.  No dizziness this AM.  Objective: Vitals:   06/02/20 2130 06/03/20 0040 06/03/20 0457 06/03/20 0539  BP: (!) 121/51 (!) 141/72 (!) 136/59   Pulse: 71 71 70   Resp: '16 16 16   '$ Temp: 98 F (36.7 C) 98.2 F (36.8 C) 98.1 F (36.7 C)   TempSrc:      SpO2: 96% 97% 96%   Weight:    75.9 kg  Height:        Intake/Output Summary (Last 24 hours) at 06/03/2020 0752 Last data filed at 06/02/2020 1401 Gross per 24 hour  Intake 360 ml  Output --  Net 360 ml   Filed Weights   06/01/20 1654  06/02/20 0201 06/03/20 0539  Weight: 75.7 kg 75.7 kg 75.9 kg    Examination: Calm, comfortable, NAD CTA, no wheeze rales rhonchi Regular, S1-S2 no gallops Soft benign positive bowel sounds No edema Alert oriented x3, grossly intact  Data Reviewed: I have personally reviewed following labs and imaging studies  CBC: No results for input(s): WBC, NEUTROABS, HGB, HCT, MCV, PLT in the last 168 hours. Basic Metabolic Panel: Recent Labs  Lab 06/01/20 1853  PHOS 3.6   GFR: Estimated Creatinine Clearance: 23.1 mL/min (A) (by C-G formula based on SCr of 2.63 mg/dL (H)). Liver Function Tests: No results for input(s): AST, ALT, ALKPHOS, BILITOT, PROT, ALBUMIN in the last 168 hours. No results for input(s): LIPASE, AMYLASE in the last 168 hours. No results for input(s): AMMONIA in the last 168 hours. Coagulation Profile: No results for input(s): INR, PROTIME in the last 168 hours. Cardiac Enzymes: No results for input(s): CKTOTAL, CKMB, CKMBINDEX, TROPONINI in the last 168 hours. BNP (last 3 results) No results for input(s): PROBNP in the last 8760 hours. HbA1C: No results for input(s): HGBA1C in the last 72 hours. CBG: Recent Labs  Lab 05/30/20 0753 05/30/20 1600 05/30/20 2002 05/31/20 0810 06/01/20 2118  GLUCAP 97 90 136* 93 116*   Lipid Profile: No results for input(s): CHOL, HDL, LDLCALC, TRIG, CHOLHDL, LDLDIRECT in the last 72 hours.  Thyroid Function Tests: No results for input(s): TSH, T4TOTAL, FREET4, T3FREE, THYROIDAB in the last 72 hours. Anemia Panel: No results for input(s): VITAMINB12, FOLATE, FERRITIN, TIBC, IRON, RETICCTPCT in the last 72 hours. Sepsis Labs: No results for input(s): PROCALCITON, LATICACIDVEN in the last 168 hours.  No results found for this or any previous visit (from the past 240 hour(s)).       Radiology Studies: No results found.      Scheduled Meds: . amLODipine  10 mg Oral Daily  . benzonatate  200 mg Oral TID  . carvedilol   12.5 mg Oral BID WC  . Chlorhexidine Gluconate Cloth  6 each Topical Q0600  . cholecalciferol  1,000 Units Oral Daily  . epoetin (EPOGEN/PROCRIT) injection  4,000 Units Intravenous Q M,W,F-HD  . feeding supplement  237 mL Oral BID BM  . heparin  5,000 Units Subcutaneous Q8H  . lidocaine  1 patch Transdermal Q24H  . losartan  75 mg Oral Daily  . melatonin  5 mg Oral QPM  . methocarbamol  500 mg Oral QID  . midodrine  10 mg Oral Q M,W,F  . multivitamin  1 tablet Oral QHS  . polyethylene glycol  17 g Oral Daily  . senna-docusate  1 tablet Oral BID  . sevelamer carbonate  800 mg Oral TID WC  . sodium chloride flush  3 mL Intravenous Q12H  . topiramate  25 mg Oral BID  . torsemide  40 mg Oral Daily  . traZODone  50 mg Oral QPM  . vitamin B-12  100 mcg Oral Daily   Continuous Infusions: . sodium chloride      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)   Hyperkalemia   Pressure injury of skin   Pleural effusion   Acute heart failure with preserved ejection fraction (HFpEF) (HCC)   Chronic kidney disease (CKD), stage IV (severe) (HCC)   Pulmonary hypertension, unspecified (HCC)   Volume overload In setting of CKD. Cardiomegaly on chest x-ray.  Started on Lasix IV on admission but has now progressed to requiring hemodialysis. CKD likely progressed to ESRD 1/21-more euvolemic.  On hemodialysis today. Has permacath placed to the right chest Patient has dialysis chair set up when patient is discharged     Pleural effusion In setting of fluid overload with new diagnosis of grade 2 diastolic dysfunction Echo 05/01/2020 with normal EF Status post diagnostic right thoracentesis performed on 12/20 No evidence of malignancy on cytology      Diabetes mellitus, type 2 Hemoglobin A1C of 4.7%.  BG have been  controlled  Diet control    ESRD-MWF Unknown baseline. 2/2 HTN and atherosclerosis Hx/o NSADS Patient is unsure of her primary nephrologist in [place].  Creatinine of 2.97 on admission.  Per patient, she was planned to start hemodialysis in addition to consideration for renal transplant.  Nephrology consulted on 12/20.  Worsening renal function in setting of attempted Lasix diuresis for heart failure. Nephrology now started hemodialysis. 1/21-HD today Expected [place] Pending financial clearance for discharge planning TOC attempting disposition to SNF Nephrology following        Acute diastolic heart failure New diagnosis. Grade 2 dysfunction. Normal EF.  Associated RV systolic dysfunction with elevated PA pressure seen on TTE although RHC on 12/28 significant for mildly elevated  right heart/pulmonary artery pressures.  1/21-resolved, remains euvolemic Continue torsemide per nephrology's recommendation Daily weight I's and O's      Moderate pericardial effusion No clinical evidence of tamponade.  No recent infections.  In setting of CKD V with elevated BUN. No chest pain. -cardiology rec. Outpatient Echo for surveillance Hemodynamics stable , continue to monitor   Mild/moderate aortic stenosis Unsure if this is symptomatic.  Patient does have dyspnea with exertion but this may be multifactorial. -Cardiology recommendations: outpatient Transthoracic Echocardiogram for surveillance  Primary hypertension Patient is on at least hydrochlorothiazide as an outpatient but is unsure of her regimen. Improved control -Losartan, carvedilol, amlodipine -Lasix as above  Anemia Possible related to underlying kidney disease.  Evidence of macrocytosis on CBC.  TIBC low but otherwise normal iron panel. Vitamin B12 level 358, target > 400, started oral supplement and folate normal.  Hyperkalemia In setting of CKD. Lokelma 10 g given with  improvement.  Pressure injury Mid coccyx, POA  History of tobacco use Patient has quit for at least 10 years. Previously, 20 pack year history.  Headache Normal chronic headache. Patient states she takes Imitrex as needed.  Continue Tylenol and oxycodone as needed 1/8 s/p Fioricet 1 dose 1/9 IV Solu-Medrol 60 mg x 1 dose, Benadryl 25 mg IV x1 dose and Phenergan 25 mg IV x1 dose given 1/10Headache improved,patient slept well it seems insomnia is contributing to headache  Insomnia, started trazodone and melatonin Continue melatonin and trazodone nightly,seems to be helping patient to sleep   Obesity Body mass index is 30.91 kg/m.   DVT prophylaxis: Heparin Code Status: Full Family Communication: None at bedside  Status is: Inpatient  Remains inpatient appropriate because:Unsafe d/c plan   Dispo: The patient is from: Home              Anticipated d/c is to: SNF              Anticipated d/c date is: > 3 days              Patient currently is medically stable to d/c.  Patient currently medically stable for discharge.  Outpatient hemodialysis chair has been arranged.  SNF is pending and that is a barrier to discharge.  Continue case management administration involved.  Disposition plan pending.            LOS: 33 days   Time spent: 35 min with >50% on coc    Nolberto Hanlon, MD [facility] Pager [phone]  If 7PM-7AM, please contact night-coverage 06/03/2020, 7:52 AM

## 2020-06-03 NOTE — TOC Progression Note (Signed)
Transition of Care [facility]) - Progression Note    Patient Details  Name: Jonise Weightman MRN: [id number] Date of Birth: May 30, 1955  Transition of Care [facility]) CM/SW [place], RN Phone Number: 06/03/2020, 8:35 AM  Clinical Narrative:    LVMM for Kathrynn Ducking @ [phone] regarding potential SNF placement and LOG. Confirmed room/board and therapy would be covered under LOG but not transportation. Requested availability of HD centers local to this facility in order to assist HD Navigator in setting up OPHD.    Expected Discharge Plan: Skilled Nursing Facility Barriers to Discharge: Continued Medical Work up,Homeless with medical needs  Expected Discharge Plan and Services Expected Discharge Plan: [facility] In-house Referral: Clinical Social Work Discharge Planning Services: Homebound not met per provider Post Acute Care Choice: [place] Living arrangements for the past 2 months: No permanent address (Lived with Sister and family)                 DME Arranged: N/A DME Agency: NA       HH Arranged: NA HH Agency: NA         Social Determinants of Health (SDOH) Interventions    Readmission Risk Interventions No flowsheet data found.

## 2020-06-03 NOTE — Progress Notes (Signed)
Physical Therapy Treatment Patient Details Name: Katie [name] MRN: [id number] DOB: December 24, 1955 Today's Date: 06/03/2020    History of Present Illness Patient is a 65 y.o. female with a history of diabetes mellitus type 2, stage IV CKD, heart failure, hypertension. Patient presented secondary to dyspnea with evidence of volume overload and pleural effusion. Diagnosed with moderate pericardial effusion, mild to moderate aortic insufficiency and aortic stenosis.  S/p R heart cath 12/28.  R temp fem cath placed 12/29; 1/3 R IJ permcath placed; 1/4 R temp fem cath removed.    PT Comments    Pt was supine in bed upon arriving. She agrees to PT session and is cooperative and pleasant throughout. Pt did not have TED stocking or abdominal binder in room. Did place TED stocking during session but pt will benefit from receiving abdominal binder to assist with management of orthostatic hypotension.Pt performed a lot of there ex prior to exiting bed. BP in supine: 122/51 Upon sitting up BP: 104/54 Standing BP: 77/47 ( pt does endorse severe fatigue/dizziness/ lightheadedness)  Pt sat EOB, and symptoms resolved. BP 99/54 attempted standing again however pt dizzy again with BP 74/46. Returned to supine.   At conclusion of session, pt was in bed with call bell in reach. Pt has great strength but has been extremely limited by BP/dizziness/lightheadedness). Recommend HHPT if pt's orthostatic hypotension improves.    Follow Up Recommendations  SNF;Other (comment);Home health PT (Pending progress with orthostatic hypotension)     Equipment Recommendations  Rolling walker with 5" wheels;3in1 (PT)    Recommendations for Other Services       Precautions / Restrictions Precautions Precautions: Fall Precaution Comments: R IJ permcath Restrictions Weight Bearing Restrictions: No    Mobility  Bed Mobility Overal bed mobility: Needs Assistance Bed Mobility: Supine to Sit;Sit to Supine     Supine to  sit: Supervision;HOB elevated Sit to supine: Supervision;HOB elevated   General bed mobility comments: BP in supine 122/51  Transfers Overall transfer level: Needs assistance Equipment used: Rolling walker (2 wheeled) Transfers: Sit to/from Stand Sit to Stand: From elevated surface;Supervision Stand pivot transfers: Supervision          Ambulation/Gait             General Gait Details: unable to safely due to orthostatic hypotensive concerns       Balance Overall balance assessment: Needs assistance Sitting-balance support: Feet supported Sitting balance-Leahy Scale: Good Sitting balance - Comments: no LOB sitting EOB   Standing balance support: Bilateral upper extremity supported Standing balance-Leahy Scale: Fair             Cognition Arousal/Alertness: Awake/alert Behavior During Therapy: WFL for tasks assessed/performed Overall Cognitive Status: Within Functional Limits for tasks assessed            General Comments: Pt is A and O x 4. cooperative and pleasant throughout             Pertinent Vitals/Pain Pain Assessment: No/denies pain Pain Score: 0-No pain           PT Goals (current goals can now be found in the care plan section) Acute Rehab PT Goals Patient Stated Goal: to go home Progress towards PT goals: Progressing toward goals    Frequency    Min 2X/week      PT Plan Current plan remains appropriate       AM-PAC PT "6 Clicks" Mobility   Outcome Measure  Help needed turning from your back to your side while  in a flat bed without using bedrails?: A Little Help needed moving from lying on your back to sitting on the side of a flat bed without using bedrails?: A Little Help needed moving to and from a bed to a chair (including a wheelchair)?: A Little Help needed standing up from a chair using your arms (e.g., wheelchair or bedside chair)?: A Little Help needed to walk in hospital room?: A Little Help needed climbing 3-5  steps with a railing? : A Lot 6 Click Score: 17    End of Session   Activity Tolerance: Patient tolerated treatment well Patient left: in bed;with call bell/phone within reach Nurse Communication: Mobility status;Precautions PT Visit Diagnosis: Unsteadiness on feet (R26.81);Muscle weakness (generalized) (M62.81);History of falling (Z91.81);Other abnormalities of gait and mobility (R26.89)     Time: [id number] PT Time Calculation (min) (ACUTE ONLY): 18 min  Charges:  $Therapeutic Activity: 8-22 mins                     Julaine Fusi PTA 06/03/20, 4:47 PM

## 2020-06-04 LAB — PHOSPHORUS: Phosphorus: 4.7 mg/dL — ABNORMAL HIGH (ref 2.5–4.6)

## 2020-06-04 MED ORDER — AMLODIPINE BESYLATE 5 MG PO TABS
5.0000 mg | ORAL_TABLET | Freq: Every day | ORAL | Status: DC
  Administered 2020-06-05 – 2020-06-07 (×2): 5 mg via ORAL
  Filled 2020-06-04 (×3): qty 1

## 2020-06-04 MED ORDER — MIDODRINE HCL 5 MG PO TABS
5.0000 mg | ORAL_TABLET | Freq: Three times a day (TID) | ORAL | Status: DC
Start: 2020-06-04 — End: 2020-06-13
  Administered 2020-06-04 – 2020-06-13 (×24): 5 mg via ORAL
  Filled 2020-06-04 (×24): qty 1

## 2020-06-04 NOTE — Progress Notes (Signed)
PROGRESS NOTE    Katie [name]  [id number] DOB: 12/27/1955 DOA: 04/30/2020 PCP: Patient, [name]    Brief Narrative:  Katie [name] a 65 y.o.female with a history of diabetes mellitus type 2, stage IV CKD, heart failure, hypertension. Patient presented secondary to dyspnea with evidence of volume overload and pleural effusion.  Patient has had an extended hospital course.  Her acute medical issues have essentially resolved at this point.  She is medically stable for discharge however we do not have a safe disposition plan.  Patient does not have stable living and lives with a friend here.  She states that the friend likely will not be able to care for her.  TOC working to find placement however multiple facilities have refused at this point.  Senior case management involved.  Expanding search.  Patient remains stable.  Continues to be very unmotivated always having pain complaints when PT attempted to work with her however then later complains of buttock pain from sitting too much.  I try to reinforce the need for exertion and ambulation daily basis.   1/19- Had HD today. +orthostatics on PT evaluation today. Pt reports being symptomatic when bp drops  1/20-has no complaints this am. No overnight issues. 1/21-pt seen in HD today. Has no complaints.  1/22-pt had HD yesterday. She was positive for orthostatics, bp supine 122/51 with standing dropped 77/47 and was symptomatic.   Consultants:   nephrology  Procedures: HD  Antimicrobials:       Subjective: Has no dizziness while laying down. No sob or cp  Objective: Vitals:   06/03/20 2007 06/03/20 2359 06/04/20 0508 06/04/20 0738  BP: (!) 112/99 132/67 (!) 154/66 140/60  Pulse: 77 72 72 72  Resp: '17  16 17  '$ Temp: 98.8 F (37.1 C) 98.7 F (37.1 C) 98.3 F (36.8 C) 98.5 F (36.9 C)  TempSrc:      SpO2: 95% 94% 95% 95%  Weight:      Height:        Intake/Output Summary (Last 24 hours) at 06/04/2020  0833 Last data filed at 06/03/2020 1330 Gross per 24 hour  Intake -  Output 917 ml  Net -917 ml   Filed Weights   06/01/20 1654 06/02/20 0201 06/03/20 0539  Weight: 75.7 kg 75.7 kg 75.9 kg    Examination: Calm, comfortable, NAD CTA, no R/W/R Regular S1-S2 no gallops Soft nontender nondistended positive bowel sounds Trace edema bilateral lower extremity Alert oriented x3, grossly intact Mood and affect appropriate in current setting  Data Reviewed: I have personally reviewed following labs and imaging studies  CBC: No results for input(s): WBC, NEUTROABS, HGB, HCT, MCV, PLT in the last 168 hours. Basic Metabolic Panel: Recent Labs  Lab 06/01/20 1853 06/03/20 1242  NA  --  139  K  --  3.8  CL  --  102  CO2  --  29  GLUCOSE  --  126*  BUN  --  19  CREATININE  --  1.48*  CALCIUM  --  7.9*  PHOS 3.6  --    GFR: Estimated Creatinine Clearance: 41 mL/min (A) (by C-G formula based on SCr of 1.48 mg/dL (H)). Liver Function Tests: No results for input(s): AST, ALT, ALKPHOS, BILITOT, PROT, ALBUMIN in the last 168 hours. No results for input(s): LIPASE, AMYLASE in the last 168 hours. No results for input(s): AMMONIA in the last 168 hours. Coagulation Profile: No results for input(s): INR, PROTIME in the last 168 hours. Cardiac  Enzymes: No results for input(s): CKTOTAL, CKMB, CKMBINDEX, TROPONINI in the last 168 hours. BNP (last 3 results) No results for input(s): PROBNP in the last 8760 hours. HbA1C: No results for input(s): HGBA1C in the last 72 hours. CBG: Recent Labs  Lab 05/30/20 0753 05/30/20 1600 05/30/20 2002 05/31/20 0810 06/01/20 2118  GLUCAP 97 90 136* 93 116*   Lipid Profile: No results for input(s): CHOL, HDL, LDLCALC, TRIG, CHOLHDL, LDLDIRECT in the last 72 hours. Thyroid Function Tests: No results for input(s): TSH, T4TOTAL, FREET4, T3FREE, THYROIDAB in the last 72 hours. Anemia Panel: No results for input(s): VITAMINB12, FOLATE, FERRITIN, TIBC,  IRON, RETICCTPCT in the last 72 hours. Sepsis Labs: No results for input(s): PROCALCITON, LATICACIDVEN in the last 168 hours.  No results found for this or any previous visit (from the past 240 hour(s)).       Radiology Studies: No results found.      Scheduled Meds: . amLODipine  10 mg Oral Daily  . benzonatate  200 mg Oral TID  . carvedilol  12.5 mg Oral BID WC  . Chlorhexidine Gluconate Cloth  6 each Topical Q0600  . cholecalciferol  1,000 Units Oral Daily  . epoetin (EPOGEN/PROCRIT) injection  4,000 Units Intravenous Q M,W,F-HD  . feeding supplement  237 mL Oral BID BM  . heparin  5,000 Units Subcutaneous Q8H  . lidocaine  1 patch Transdermal Q24H  . losartan  75 mg Oral Daily  . melatonin  5 mg Oral QPM  . methocarbamol  500 mg Oral QID  . midodrine  10 mg Oral Q M,W,F  . multivitamin  1 tablet Oral QHS  . polyethylene glycol  17 g Oral Daily  . senna-docusate  1 tablet Oral BID  . sevelamer carbonate  800 mg Oral TID WC  . sodium chloride flush  3 mL Intravenous Q12H  . topiramate  25 mg Oral BID  . torsemide  40 mg Oral Daily  . traZODone  50 mg Oral QPM  . vitamin B-12  100 mcg Oral Daily   Continuous Infusions: . sodium chloride      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)   Hyperkalemia   Pressure injury of skin   Pleural effusion   Acute heart failure with preserved ejection fraction (HFpEF) (HCC)   Chronic kidney disease (CKD), stage IV (severe) (HCC)   Pulmonary hypertension, unspecified (HCC)   Volume overload In setting of CKD. Cardiomegaly on chest x-ray.  Started on Lasix IV on admission but has now progressed to requiring hemodialysis. CKD likely progressed to ESRD 1/22-euvolemic on exam Getting dialysis Had permacath placed to the right chest Patient has dialysis  chair set up 1 patient is discharged      Pleural effusion In setting of fluid overload with new diagnosis of grade 2 diastolic dysfunction  Echo 05/01/2020 with normal EF Status post diagnostic right thoracentesis performed on 12/20 No evidence of malignancy on cytology       Diabetes mellitus, type 2 Hemoglobin A1C of 4.7%.  1/22-BG controlled  Diet controlled      ESRD-MWF- Unknown baseline. 2/2 HTN and atherosclerosis Hx/o NSADS Patient is unsure of her primary nephrologist in [place].  Creatinine of 2.97 on admission.  Per patient, she was planned to start hemodialysis in addition to consideration  for renal transplant.  Nephrology consulted on 12/20.  Worsening renal function in setting of attempted Lasix diuresis for heart failure. Nephrology now started hemodialysis. 1/22-had dialysis yesterday.  Does become orthostatics with dialysis and is on midodrine. Will increase midodrine to 5 mg p.o. 3 times daily except for Monday Wednesday Friday dialysis today where she needs to get 10 mg as previous order Expected [place] Pending financial clearance for discharge planning TOC attempting disposition to SNF Nephrology following         Acute diastolic heart failure New diagnosis. Grade 2 dysfunction. Normal EF.  Associated RV systolic dysfunction with elevated PA pressure seen on TTE although RHC on 12/28 significant for mildly elevated right heart/pulmonary artery pressures.  resolved, remains euvolemic Continue torsemide per nephrology's recommendation Daily weight I's and O's      Moderate pericardial effusion No clinical evidence of tamponade.  No recent infections.  In setting of CKD V with elevated BUN. No chest pain. -cardiology rec. Outpatient Echo for surveillance Hemodynamics stable , continue to monitor   Mild/moderate aortic stenosis Unsure if this is symptomatic.  Patient does have dyspnea with exertion but this  may be multifactorial. -Cardiology recommendations: outpatient Transthoracic Echocardiogram for surveillance  Primary hypertension Patient is on at least hydrochlorothiazide as an outpatient but is unsure of her regimen. Improved control -Losartan, carvedilol, amlodipine -Lasix as above  Anemia Possible related to underlying kidney disease.  Evidence of macrocytosis on CBC.  TIBC low but otherwise normal iron panel. Vitamin B12 level 358, target > 400, started oral supplement and folate normal.  Hyperkalemia In setting of CKD. Lokelma 10 g given with improvement.  Pressure injury Mid coccyx, POA  History of tobacco use Patient has quit for at least 10 years. Previously, 20 pack year history.  Headache Normal chronic headache. Patient states she takes Imitrex as needed.  Continue Tylenol and oxycodone as needed 1/8 s/p Fioricet 1 dose 1/9 IV Solu-Medrol 60 mg x 1 dose, Benadryl 25 mg IV x1 dose and Phenergan 25 mg IV x1 dose given 1/10Headache improved,patient slept well it seems insomnia is contributing to headache  Insomnia, started trazodone and melatonin Continue melatonin and trazodone nightly,seems to be helping patient to sleep   Obesity Body mass index is 30.91 kg/m.   DVT prophylaxis: Heparin Code Status: Full Family Communication: None at bedside  Status is: Inpatient  Remains inpatient appropriate because:Unsafe d/c plan   Dispo: The patient is from: Home              Anticipated d/c is to: SNF              Anticipated d/c date is: > 3 days              Patient currently is medically stable to d/c.  Patient currently medically stable for discharge.  Outpatient hemodialysis chair has been arranged.  SNF is pending and that is a barrier to discharge.  Continue case management administration involved.  Disposition plan pending.            LOS: 34 days   Time spent: 35 min with >50% on coc    Nolberto Hanlon, MD [facility] Pager [phone]  If 7PM-7AM, please contact night-coverage 06/04/2020, 8:33 AM

## 2020-06-04 NOTE — Progress Notes (Signed)
[facility]  ROUNDING NOTE   Subjective:   Hemodialysis treatment yesterday. Tolerated treatment well. UOP 946m.   Objective:  Vital signs in last 24 hours:  Temp:  [98.2 F (36.8 C)-98.8 F (37.1 C)] 98.2 F (36.8 C) (01/22 1140) Pulse Rate:  [68-77] 68 (01/22 1140) Resp:  [16-17] 16 (01/22 1140) BP: (112-154)/(58-99) 132/58 (01/22 1140) SpO2:  [94 %-95 %] 95 % (01/22 0738)  Weight change:  Filed Weights   06/01/20 1654 06/02/20 0201 06/03/20 0539  Weight: 75.7 kg 75.7 kg 75.9 kg    Intake/Output: I/O last 3 completed shifts: In: -  Out: 917 [Other:917]   Intake/Output this shift:  Total I/O In: 240 [P.O.:240] Out: -   Physical Exam: General:  Laying in bed, no acute distress  HEENT  normocephalic, atraumatic  Pulm/lungs  clear bilateral  CVS/Heart  regular  Abdomen:   Soft, nontender, nondistended  Extremities:  No lower extremity edema  Neurologic:  AAOx4  Skin:  No acute rashes, or lesions   Access: RIJ permcath 05/16/20 Dr. DLucky Cowboy   Basic Metabolic Panel: Recent Labs  Lab 06/01/20 1853 06/03/20 1242 06/04/20 0951  NA  --  139  --   K  --  3.8  --   CL  --  102  --   CO2  --  29  --   GLUCOSE  --  126*  --   BUN  --  19  --   CREATININE  --  1.48*  --   CALCIUM  --  7.9*  --   PHOS 3.6  --  4.7*    Liver Function Tests: No results for input(s): AST, ALT, ALKPHOS, BILITOT, PROT, ALBUMIN in the last 168 hours. No results for input(s): LIPASE, AMYLASE in the last 168 hours. No results for input(s): AMMONIA in the last 168 hours.  CBC: No results for input(s): WBC, NEUTROABS, HGB, HCT, MCV, PLT in the last 168 hours.  Cardiac Enzymes: No results for input(s): CKTOTAL, CKMB, CKMBINDEX, TROPONINI in the last 168 hours.  BNP: Invalid input(s): POCBNP  CBG: Recent Labs  Lab 05/30/20 0753 05/30/20 1600 05/30/20 2002 05/31/20 0810 06/01/20 2118  GLUCAP 97 90 136* 93 116*    Microbiology: Results for orders placed or  performed during the hospital encounter of 04/30/20  Resp Panel by RT-PCR (Flu A&B, Covid) Nasopharyngeal Swab     Status: None   Collection Time: 04/30/20  4:01 PM   Specimen: Nasopharyngeal Swab; Nasopharyngeal(NP) swabs in vial transport medium  Result Value Ref Range Status   SARS Coronavirus 2 by RT PCR NEGATIVE NEGATIVE Final    Comment: (NOTE) SARS-CoV-2 target nucleic acids are NOT DETECTED.  The SARS-CoV-2 RNA is generally detectable in upper respiratory specimens during the acute phase of infection. The lowest concentration of SARS-CoV-2 viral copies this assay can detect is 138 copies/mL. A negative result does not preclude SARS-Cov-2 infection and should not be used as the sole basis for treatment or other patient management decisions. A negative result may occur with  improper specimen collection/handling, submission of specimen other than nasopharyngeal swab, presence of viral mutation(s) within the areas targeted by this assay, and inadequate number of viral copies(<138 copies/mL). A negative result must be combined with clinical observations, patient history, and epidemiological information. The expected result is Negative.  Fact Sheet for Patients:  hEntrepreneurPulse.com.au Fact Sheet for Healthcare Providers:  hIncredibleEmployment.be This test is no t yet approved or cleared by the [place]  and  has been authorized for detection and/or diagnosis of SARS-CoV-2 by FDA under an Emergency Use Authorization (EUA). This EUA will remain  in effect (meaning this test can be used) for the duration of the COVID-19 declaration under Section 564(b)(1) of the Act, 21 U.S.C.section 360bbb-3(b)(1), unless the authorization is terminated  or revoked sooner.       Influenza A by PCR NEGATIVE NEGATIVE Final   Influenza B by PCR NEGATIVE NEGATIVE Final    Comment: (NOTE) The Xpert Xpress SARS-CoV-2/FLU/RSV plus assay is intended as  an aid in the diagnosis of influenza from Nasopharyngeal swab specimens and should not be used as a sole basis for treatment. Nasal washings and aspirates are unacceptable for Xpert Xpress SARS-CoV-2/FLU/RSV testing.  Fact Sheet for Patients: EntrepreneurPulse.com.au  Fact Sheet for Healthcare Providers: IncredibleEmployment.be  This test is not yet approved or cleared by the [place] FDA and has been authorized for detection and/or diagnosis of SARS-CoV-2 by FDA under an Emergency Use Authorization (EUA). This EUA will remain in effect (meaning this test can be used) for the duration of the COVID-19 declaration under Section 564(b)(1) of the Act, 21 U.S.C. section 360bbb-3(b)(1), unless the authorization is terminated or revoked.  Performed at [facility], [address] [place], [place] [address]   Body fluid culture     Status: None   Collection Time: 05/02/20  4:44 PM   Specimen: PATH Cytology Pleural fluid  Result Value Ref Range Status   Specimen Description   Final    PLEURAL Performed at [facility], [address] [place], [place] [address]    Special Requests   Final    NONE Performed at [facility], [address] [place], [place] [address]    Gram Stain   Final    MODERATE WBC PRESENT, PREDOMINANTLY MONONUCLEAR NO ORGANISMS SEEN    Culture   Final    NO GROWTH Performed at [facility], [place] [address], [place], [place] [address]    Report Status 05/06/2020 FINAL  Final  MRSA PCR Screening     Status: None   Collection Time: 05/16/20  9:29 PM   Specimen: Nasal Mucosa; Nasopharyngeal  Result Value Ref Range Status   MRSA by PCR NEGATIVE NEGATIVE Final    Comment:        The GeneXpert MRSA Assay (FDA approved for NASAL specimens only), is one component of a comprehensive MRSA colonization surveillance program. It is not intended to diagnose MRSA infection nor to guide  or monitor treatment for MRSA infections. Performed at [facility], [address] [place], [place] [address]     Coagulation Studies: No results for input(s): LABPROT, INR in the last 72 hours.  Urinalysis: No results for input(s): COLORURINE, LABSPEC, PHURINE, GLUCOSEU, HGBUR, BILIRUBINUR, KETONESUR, PROTEINUR, UROBILINOGEN, NITRITE, LEUKOCYTESUR in the last 72 hours.  Invalid input(s): APPERANCEUR    Imaging: No results found.   Medications:   . sodium chloride     . [START ON 06/05/2020] amLODipine  5 mg Oral Daily  . benzonatate  200 mg Oral TID  . carvedilol  12.5 mg Oral BID WC  . Chlorhexidine Gluconate Cloth  6 each Topical Q0600  . cholecalciferol  1,000 Units Oral Daily  . epoetin (EPOGEN/PROCRIT) injection  4,000 Units Intravenous Q M,W,F-HD  . feeding supplement  237 mL Oral BID BM  . heparin  5,000 Units Subcutaneous Q8H  . lidocaine  1 patch Transdermal Q24H  . losartan  75 mg Oral Daily  . melatonin  5 mg Oral QPM  . methocarbamol  500 mg Oral QID  . midodrine  5 mg Oral TID WC  . multivitamin  1 tablet Oral QHS  . polyethylene glycol  17 g Oral Daily  . senna-docusate  1 tablet Oral BID  . sevelamer carbonate  800 mg Oral TID WC  . sodium chloride flush  3 mL Intravenous Q12H  . topiramate  25 mg Oral BID  . torsemide  40 mg Oral Daily  . traZODone  50 mg Oral QPM  . vitamin B-12  100 mcg Oral Daily   sodium chloride, acetaminophen **OR** acetaminophen, ALPRAZolam, bisacodyl, chlorpheniramine-HYDROcodone, ondansetron **OR** ondansetron (ZOFRAN) IV, oxyCODONE-acetaminophen, promethazine, sodium chloride flush, SUMAtriptan  Assessment/ Plan:  [name] is a 65 y.o. white female with diastolic congestive heart failure, hypertension, diabetes mellitus type II who is admitted to [facility] on 04/30/2020 for Chronic kidney disease (CKD), stage IV (severe) ([place]) [N18.4] Volume overload [E87.70]  Patient initiated on hemodialysis this  admission.   1. End stage renal disease requiring dialysis Mondays and Fridays HD again on Monday.  2. Hypertension with lower extremity edema:  Challenging situation as she has some element of autonomic neuropathy and orthostatic hypotension.  Has been reporting dizziness. -Started on midodrine on dialysis days. - decrease amlodipine to '5mg'$  daily  3. Anemia of chronic kidney disease:  Lab Results  Component Value Date   HGB 7.8 (L) 05/26/2020  EPO with HD  4. Secondary hyperparathyroidism Lab Results  Component Value Date   PTH 70 (H) 05/12/2020   CALCIUM 7.9 (L) 06/03/2020   PHOS 4.7 (H) 06/04/2020  - sevelamer   5.  Diabetes type 2 with CKD - continue glucose control    LOS: 34 Jerell Demery 1/22/20223:24 PM

## 2020-06-05 MED ORDER — EPOETIN ALFA 10000 UNIT/ML IJ SOLN
10000.0000 [IU] | INTRAMUSCULAR | Status: DC
Start: 2020-06-06 — End: 2020-06-27
  Administered 2020-06-06 – 2020-06-24 (×6): 10000 [IU] via INTRAVENOUS
  Filled 2020-06-05 (×10): qty 1

## 2020-06-05 NOTE — Progress Notes (Signed)
PROGRESS NOTE    Katie [name]  [id number] DOB: 05/04/1956 DOA: 04/30/2020 PCP: Patient, [name]    Brief Narrative:  Katie [name] a 65 y.o.female with a history of diabetes mellitus type 2, stage IV CKD, heart failure, hypertension. Patient presented secondary to dyspnea with evidence of volume overload and pleural effusion.  Patient has had an extended hospital course.  Her acute medical issues have essentially resolved at this point.  She is medically stable for discharge however we do not have a safe disposition plan.  Patient does not have stable living and lives with a friend here.  She states that the friend likely will not be able to care for her.  TOC working to find placement however multiple facilities have refused at this point.  Senior case management involved.  Expanding search.  Patient remains stable.  Continues to be very unmotivated always having pain complaints when PT attempted to work with her however then later complains of buttock pain from sitting too much.  I try to reinforce the need for exertion and ambulation daily basis.   1/19- Had HD today. +orthostatics on PT evaluation today. Pt reports being symptomatic when bp drops  1/20-has no complaints this am. No overnight issues. 1/21-pt seen in HD today. Has no complaints.  1/22-pt had HD yesterday. She was positive for orthostatics, bp supine 122/51 with standing dropped 77/47 and was symptomatic.  1/23 no overnight issues, HD tomorrow.  Consultants:   nephrology  Procedures: HD  Antimicrobials:       Subjective: Feels well.  No shortness of breath or chest pain.  Has not gotten up to see if she is dizzy.  Objective: Vitals:   06/04/20 1540 06/04/20 2016 06/04/20 2348 06/05/20 0506  BP: 124/62 133/66 137/65 (!) 145/66  Pulse: 72 73 70 69  Resp: '16 17 16 16  '$ Temp: 98.5 F (36.9 C) 98.1 F (36.7 C) 98.4 F (36.9 C) 98.3 F (36.8 C)  TempSrc:      SpO2: 97% 97% 97% 95%  Weight:       Height:        Intake/Output Summary (Last 24 hours) at 06/05/2020 0805 Last data filed at 06/04/2020 2205 Gross per 24 hour  Intake 243 ml  Output 800 ml  Net -557 ml   Filed Weights   06/01/20 1654 06/02/20 0201 06/03/20 0539  Weight: 75.7 kg 75.7 kg 75.9 kg    Examination: Calm, laying in bed, in NAD CTA, no wheeze rales rhonchi's Regular S1-S2 no murmurs or rubs Soft benign positive bowel sounds No edema bilaterally Alert oriented x3, grossly intact  Data Reviewed: I have personally reviewed following labs and imaging studies  CBC: No results for input(s): WBC, NEUTROABS, HGB, HCT, MCV, PLT in the last 168 hours. Basic Metabolic Panel: Recent Labs  Lab 06/01/20 1853 06/03/20 1242 06/04/20 0951  NA  --  139  --   K  --  3.8  --   CL  --  102  --   CO2  --  29  --   GLUCOSE  --  126*  --   BUN  --  19  --   CREATININE  --  1.48*  --   CALCIUM  --  7.9*  --   PHOS 3.6  --  4.7*   GFR: Estimated Creatinine Clearance: 41 mL/min (A) (by C-G formula based on SCr of 1.48 mg/dL (H)). Liver Function Tests: No results for input(s): AST, ALT, ALKPHOS, BILITOT, PROT,  ALBUMIN in the last 168 hours. No results for input(s): LIPASE, AMYLASE in the last 168 hours. No results for input(s): AMMONIA in the last 168 hours. Coagulation Profile: No results for input(s): INR, PROTIME in the last 168 hours. Cardiac Enzymes: No results for input(s): CKTOTAL, CKMB, CKMBINDEX, TROPONINI in the last 168 hours. BNP (last 3 results) No results for input(s): PROBNP in the last 8760 hours. HbA1C: No results for input(s): HGBA1C in the last 72 hours. CBG: Recent Labs  Lab 05/30/20 0753 05/30/20 1600 05/30/20 2002 05/31/20 0810 06/01/20 2118  GLUCAP 97 90 136* 93 116*   Lipid Profile: No results for input(s): CHOL, HDL, LDLCALC, TRIG, CHOLHDL, LDLDIRECT in the last 72 hours. Thyroid Function Tests: No results for input(s): TSH, T4TOTAL, FREET4, T3FREE, THYROIDAB in the last  72 hours. Anemia Panel: No results for input(s): VITAMINB12, FOLATE, FERRITIN, TIBC, IRON, RETICCTPCT in the last 72 hours. Sepsis Labs: No results for input(s): PROCALCITON, LATICACIDVEN in the last 168 hours.  No results found for this or any previous visit (from the past 240 hour(s)).       Radiology Studies: No results found.      Scheduled Meds: . amLODipine  5 mg Oral Daily  . benzonatate  200 mg Oral TID  . carvedilol  12.5 mg Oral BID WC  . Chlorhexidine Gluconate Cloth  6 each Topical Q0600  . cholecalciferol  1,000 Units Oral Daily  . epoetin (EPOGEN/PROCRIT) injection  4,000 Units Intravenous Q M,W,F-HD  . feeding supplement  237 mL Oral BID BM  . heparin  5,000 Units Subcutaneous Q8H  . lidocaine  1 patch Transdermal Q24H  . losartan  75 mg Oral Daily  . melatonin  5 mg Oral QPM  . methocarbamol  500 mg Oral QID  . midodrine  5 mg Oral TID WC  . multivitamin  1 tablet Oral QHS  . polyethylene glycol  17 g Oral Daily  . senna-docusate  1 tablet Oral BID  . sevelamer carbonate  800 mg Oral TID WC  . sodium chloride flush  3 mL Intravenous Q12H  . topiramate  25 mg Oral BID  . torsemide  40 mg Oral Daily  . traZODone  50 mg Oral QPM  . vitamin B-12  100 mcg Oral Daily   Continuous Infusions: . sodium chloride      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)   Hyperkalemia   Pressure injury of skin   Pleural effusion   Acute heart failure with preserved ejection fraction (HFpEF) (HCC)   Chronic kidney disease (CKD), stage IV (severe) (HCC)   Pulmonary hypertension, unspecified (HCC)   Volume overload In setting of CKD. Cardiomegaly on chest x-ray.  Started on Lasix IV on admission but has now progressed to requiring hemodialysis. CKD likely progressed to ESRD 1/23-remains  euvolemic and clinically stable On dialysis, next dialysis on Monday Have permacath placed to the right chest Patient has dialysis chair set up when patient is discharged      Pleural effusion In setting of fluid overload with new diagnosis of grade 2 diastolic dysfunction  Echo 05/01/2020 with normal EF  Status post diagnostic right thoracentesis performed on 12/20  No evidence of malignancy on cytology     Diabetes mellitus, type 2 Hemoglobin A1C of 4.7%.  1/23-blood glucose levels have remained stable  Diet controlled      ESRD-MWF- Unknown baseline. 2/2 HTN and atherosclerosis Hx/o NSADS Patient is unsure of her primary nephrologist in [place].  Creatinine of 2.97 on admission.  Per patient, she was planned to start hemodialysis in addition to consideration for renal transplant.  Nephrology consulted on 12/20.  Worsening renal function in setting of attempted Lasix diuresis for heart failure. Nephrology now started hemodialysis.  1/23-dialysis on Monday Patient on midodrine 10 mg on dialysis days due to orthostatic hypotension which she becomes very symptomatic Was started on midodrine 5 mg p.o. 3 times daily on 1/22 besides the 10 mg given on dialysis day Expected [place] Pending financial clearance for discharge planning TOC attempting disposition to SNF Nephrology following          Acute diastolic heart failure New diagnosis. Grade 2 dysfunction. Normal EF.  Associated RV systolic dysfunction with elevated PA pressure seen on TTE although RHC on 12/28 significant for mildly elevated right heart/pulmonary artery pressures.  1/23-resolved, remains euvolemic  On torsemide per nephrology's recommendation  Daily weight  I's and O's    Primary hypertension Patient is on at least hydrochlorothiazide as an outpatient but is unsure of her regimen.  Stable, but has orthostatic hypotension, challenging to control Started on midodrine as  above Decrease amlodipine to 5 mg daily New losartan, torsemide, and carvedilol    Moderate pericardial effusion No clinical evidence of tamponade.  No recent infections.  In setting of CKD V with elevated BUN. No chest pain. -cardiology rec. Outpatient Echo for surveillance Hemodynamics stable , continue to monitor   Mild/moderate aortic stenosis Unsure if this is symptomatic.  Patient does have dyspnea with exertion but this may be multifactorial. -Cardiology recommendations: outpatient Transthoracic Echocardiogram for surveillance    Anemia Possible related to underlying kidney disease.  Evidence of macrocytosis on CBC.  TIBC low but otherwise normal iron panel. Vitamin B12 level 358, target > 400, started oral supplement and folate normal.  Hyperkalemia In setting of CKD. Lokelma 10 g given with improvement.  Pressure injury Mid coccyx, POA  History of tobacco use Patient has quit for at least 10 years. Previously, 20 pack year history.  Headache Normal chronic headache. Patient states she takes Imitrex as needed.  Continue Tylenol and oxycodone as needed 1/8 s/p Fioricet 1 dose 1/9 IV Solu-Medrol 60 mg x 1 dose, Benadryl 25 mg IV x1 dose and Phenergan 25 mg IV x1 dose given 1/10Headache improved,patient slept well it seems insomnia is contributing to headache  Insomnia, started trazodone and melatonin Continue melatonin and trazodone nightly,seems to be helping patient to sleep   Obesity Body mass index is 30.91 kg/m.   DVT prophylaxis: Heparin Code Status: Full Family Communication: None at bedside  Status is: Inpatient  Remains inpatient appropriate because:Unsafe d/c plan   Dispo: The patient is from: Home              Anticipated d/c is to: SNF              Anticipated d/c date is: TBD              Patient currently is medically stable to d/c.  Patient currently medically stable for discharge.  Outpatient hemodialysis chair has  been arranged.  SNF is pending and that is a barrier to discharge.  Continue case management administration involved.  Disposition plan pending.  LOS: 35 days   Time spent: 35 min with >50% on coc    Nolberto Hanlon, MD [facility] Pager [phone]  If 7PM-7AM, please contact night-coverage 06/05/2020, 8:05 AM   PROGRESS NOTE    Katie [name]  [id number] DOB: April 19, 1956 DOA: 04/30/2020 PCP: Patient, [name]    Brief Narrative:  Katie [name] a 65 y.o.female with a history of diabetes mellitus type 2, stage IV CKD, heart failure, hypertension. Patient presented secondary to dyspnea with evidence of volume overload and pleural effusion.  Patient has had an extended hospital course.  Her acute medical issues have essentially resolved at this point.  She is medically stable for discharge however we do not have a safe disposition plan.  Patient does not have stable living and lives with a friend here.  She states that the friend likely will not be able to care for her.  TOC working to find placement however multiple facilities have refused at this point.  Senior case management involved.  Expanding search.  Patient remains stable.  Continues to be very unmotivated always having pain complaints when PT attempted to work with her however then later complains of buttock pain from sitting too much.  I try to reinforce the need for exertion and ambulation daily basis.   1/19- Had HD today. +orthostatics on PT evaluation today. Pt reports being symptomatic when bp drops  1/20-has no complaints this am. No overnight issues. 1/21-pt seen in HD today. Has no complaints.  1/22-pt had HD yesterday. She was positive for orthostatics, bp supine 122/51 with standing dropped 77/47 and was symptomatic.   Consultants:   nephrology  Procedures: HD  Antimicrobials:       Subjective: Has no dizziness while laying down. No sob or cp  Objective: Vitals:    06/04/20 1540 06/04/20 2016 06/04/20 2348 06/05/20 0506  BP: 124/62 133/66 137/65 (!) 145/66  Pulse: 72 73 70 69  Resp: '16 17 16 16  '$ Temp: 98.5 F (36.9 C) 98.1 F (36.7 C) 98.4 F (36.9 C) 98.3 F (36.8 C)  TempSrc:      SpO2: 97% 97% 97% 95%  Weight:      Height:        Intake/Output Summary (Last 24 hours) at 06/05/2020 0805 Last data filed at 06/04/2020 2205 Gross per 24 hour  Intake 243 ml  Output 800 ml  Net -557 ml   Filed Weights   06/01/20 1654 06/02/20 0201 06/03/20 0539  Weight: 75.7 kg 75.7 kg 75.9 kg    Examination: Calm, comfortable, NAD CTA, no R/W/R Regular S1-S2 no gallops Soft nontender nondistended positive bowel sounds Trace edema bilateral lower extremity Alert oriented x3, grossly intact Mood and affect appropriate in current setting  Data Reviewed: I have personally reviewed following labs and imaging studies  CBC: No results for input(s): WBC, NEUTROABS, HGB, HCT, MCV, PLT in the last 168 hours. Basic Metabolic Panel: Recent Labs  Lab 06/01/20 1853 06/03/20 1242 06/04/20 0951  NA  --  139  --   K  --  3.8  --   CL  --  102  --   CO2  --  29  --   GLUCOSE  --  126*  --   BUN  --  19  --   CREATININE  --  1.48*  --   CALCIUM  --  7.9*  --   PHOS 3.6  --  4.7*   GFR: Estimated Creatinine Clearance: 41 mL/min (A) (by  C-G formula based on SCr of 1.48 mg/dL (H)). Liver Function Tests: No results for input(s): AST, ALT, ALKPHOS, BILITOT, PROT, ALBUMIN in the last 168 hours. No results for input(s): LIPASE, AMYLASE in the last 168 hours. No results for input(s): AMMONIA in the last 168 hours. Coagulation Profile: No results for input(s): INR, PROTIME in the last 168 hours. Cardiac Enzymes: No results for input(s): CKTOTAL, CKMB, CKMBINDEX, TROPONINI in the last 168 hours. BNP (last 3 results) No results for input(s): PROBNP in the last 8760 hours. HbA1C: No results for input(s): HGBA1C in the last 72 hours. CBG: Recent Labs  Lab  05/30/20 0753 05/30/20 1600 05/30/20 2002 05/31/20 0810 06/01/20 2118  GLUCAP 97 90 136* 93 116*   Lipid Profile: No results for input(s): CHOL, HDL, LDLCALC, TRIG, CHOLHDL, LDLDIRECT in the last 72 hours. Thyroid Function Tests: No results for input(s): TSH, T4TOTAL, FREET4, T3FREE, THYROIDAB in the last 72 hours. Anemia Panel: No results for input(s): VITAMINB12, FOLATE, FERRITIN, TIBC, IRON, RETICCTPCT in the last 72 hours. Sepsis Labs: No results for input(s): PROCALCITON, LATICACIDVEN in the last 168 hours.  No results found for this or any previous visit (from the past 240 hour(s)).       Radiology Studies: No results found.      Scheduled Meds: . amLODipine  5 mg Oral Daily  . benzonatate  200 mg Oral TID  . carvedilol  12.5 mg Oral BID WC  . Chlorhexidine Gluconate Cloth  6 each Topical Q0600  . cholecalciferol  1,000 Units Oral Daily  . epoetin (EPOGEN/PROCRIT) injection  4,000 Units Intravenous Q M,W,F-HD  . feeding supplement  237 mL Oral BID BM  . heparin  5,000 Units Subcutaneous Q8H  . lidocaine  1 patch Transdermal Q24H  . losartan  75 mg Oral Daily  . melatonin  5 mg Oral QPM  . methocarbamol  500 mg Oral QID  . midodrine  5 mg Oral TID WC  . multivitamin  1 tablet Oral QHS  . polyethylene glycol  17 g Oral Daily  . senna-docusate  1 tablet Oral BID  . sevelamer carbonate  800 mg Oral TID WC  . sodium chloride flush  3 mL Intravenous Q12H  . topiramate  25 mg Oral BID  . torsemide  40 mg Oral Daily  . traZODone  50 mg Oral QPM  . vitamin B-12  100 mcg Oral Daily   Continuous Infusions: . sodium chloride      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)   Hyperkalemia   Pressure injury of skin   Pleural effusion   Acute heart failure with preserved ejection fraction  (HFpEF) (HCC)   Chronic kidney disease (CKD), stage IV (severe) (HCC)   Pulmonary hypertension, unspecified (HCC)   Volume overload In setting of CKD. Cardiomegaly on chest x-ray.  Started on Lasix IV on admission but has now progressed to requiring hemodialysis. CKD likely progressed to ESRD 1/22-euvolemic on exam Getting dialysis Had permacath placed to the right chest Patient has dialysis chair set up 1 patient is discharged      Pleural effusion In setting of fluid overload with new diagnosis of grade 2 diastolic dysfunction  Echo 05/01/2020 with normal EF Status post diagnostic right thoracentesis performed on 12/20 No evidence of malignancy on cytology  Diabetes mellitus, type 2 Hemoglobin A1C of 4.7%.  1/22-BG controlled  Diet controlled      ESRD-MWF- Unknown baseline. 2/2 HTN and atherosclerosis Hx/o NSADS Patient is unsure of her primary nephrologist in [place].  Creatinine of 2.97 on admission.  Per patient, she was planned to start hemodialysis in addition to consideration for renal transplant.  Nephrology consulted on 12/20.  Worsening renal function in setting of attempted Lasix diuresis for heart failure. Nephrology now started hemodialysis. 1/22-had dialysis yesterday.  Does become orthostatics with dialysis and is on midodrine. Will increase midodrine to 5 mg p.o. 3 times daily except for Monday Wednesday Friday dialysis today where she needs to get 10 mg as previous order Expected [place] Pending financial clearance for discharge planning TOC attempting disposition to SNF Nephrology following         Acute diastolic heart failure New diagnosis. Grade 2 dysfunction. Normal EF.  Associated RV systolic dysfunction with elevated PA pressure seen on TTE although RHC on 12/28 significant for mildly elevated right heart/pulmonary artery pressures.  resolved, remains euvolemic Continue torsemide per nephrology's  recommendation Daily weight I's and O's      Moderate pericardial effusion No clinical evidence of tamponade.  No recent infections.  In setting of CKD V with elevated BUN. No chest pain. -cardiology rec. Outpatient Echo for surveillance Hemodynamics stable , continue to monitor   Mild/moderate aortic stenosis Unsure if this is symptomatic.  Patient does have dyspnea with exertion but this may be multifactorial. -Cardiology recommendations: outpatient Transthoracic Echocardiogram for surveillance  Primary hypertension Patient is on at least hydrochlorothiazide as an outpatient but is unsure of her regimen. Improved control -Losartan, carvedilol, amlodipine -Lasix as above  Anemia Possible related to underlying kidney disease.  Evidence of macrocytosis on CBC.  TIBC low but otherwise normal iron panel. Vitamin B12 level 358, target > 400, started oral supplement and folate normal.  Hyperkalemia In setting of CKD. Lokelma 10 g given with improvement.  Pressure injury Mid coccyx, POA  History of tobacco use Patient has quit for at least 10 years. Previously, 20 pack year history.  Headache Normal chronic headache. Patient states she takes Imitrex as needed.  Continue Tylenol and oxycodone as needed 1/8 s/p Fioricet 1 dose 1/9 IV Solu-Medrol 60 mg x 1 dose, Benadryl 25 mg IV x1 dose and Phenergan 25 mg IV x1 dose given 1/10Headache improved,patient slept well it seems insomnia is contributing to headache  Insomnia, started trazodone and melatonin Continue melatonin and trazodone nightly,seems to be helping patient to sleep   Obesity Body mass index is 30.91 kg/m.   DVT prophylaxis: Heparin Code Status: Full Family Communication: None at bedside  Status is: Inpatient  Remains inpatient appropriate because:Unsafe d/c plan   Dispo: The patient is from: Home              Anticipated d/c is to: SNF              Anticipated d/c date is: > 3  days              Patient currently is medically stable to d/c.  Patient currently medically stable for discharge.  Outpatient hemodialysis chair has been arranged.  SNF is pending and that is a barrier to discharge.  Continue case management administration involved.  Disposition plan pending.            LOS: 35 days   Time spent: 35 min with >50% on coc    Katie [name]  Kurtis Bushman, MD [facility] Pager [phone]  If 7PM-7AM, please contact night-coverage 06/05/2020, 8:05 AM

## 2020-06-05 NOTE — Progress Notes (Signed)
Mobility Specialist - Progress Note   06/05/20 1400  Mobility  Activity Ambulated in room  Range of Motion/Exercises Right leg;Left leg (AP, SLR, ABD, MARCH)  Level of Assistance Minimal assist, patient does 75% or more  Assistive Device Front wheel walker  Distance Ambulated (ft) 10 ft  Mobility Response Tolerated well  Mobility performed by Mobility specialist  $Mobility charge 1 Mobility    Pre-mobility: 76 HR, 96% SpO2 Post-mobility: 80 HR, 96% SpO2   Pt was lying in bed upon arrival utilizing room air. Pt agreed to session. Pt denied pain, nausea, or fatigue. Pt stated she was a little nervous about standing this date d/t recent hypotension episodes. Mobility offered encouragement. Pt's BP was measured and recorded prior to activity: Supine BP: 129/67 (86) EOB BP: 129/64 (81) Standing BP: 84/54 (64) Pt was able to get EOB with supervision and extra time. Upon sitting, pt reported mild dizziness, but "not as bad as it was". Pt stood to RW with supervision from elevated bed height. After BP measurement while in full upright position, pt was able to progress to marching and ambulated 10' total. Upon return to EOB, pt c/o feeling slightly SOB. O2 sat at 96% at time of complaint. Pt then performed seated exercises: ankle pumps, straight leg raises, and abduction. Overall, pt tolerated session well. Pt was able to return supine independently. Bed alarm set. Nurse entered room at the end of session and was notified of performance.    Kathee Delton Mobility Specialist 06/05/20, 2:52 PM

## 2020-06-05 NOTE — Progress Notes (Signed)
[facility]  ROUNDING NOTE   Subjective:   Patient states she is feeling down and "lousy"  Objective:  Vital signs in last 24 hours:  Temp:  [98.1 F (36.7 C)-98.4 F (36.9 C)] 98.2 F (36.8 C) (01/23 1629) Pulse Rate:  [69-75] 75 (01/23 1629) Resp:  [16-18] 18 (01/23 1629) BP: (133-162)/(65-71) 162/71 (01/23 1629) SpO2:  [92 %-97 %] 96 % (01/23 1629)  Weight change:  Filed Weights   06/01/20 1654 06/02/20 0201 06/03/20 0539  Weight: 75.7 kg 75.7 kg 75.9 kg    Intake/Output: I/O last 3 completed shifts: In: 243 [P.O.:240; I.V.:3] Out: 800 [Urine:800]   Intake/Output this shift:  Total I/O In: 220 [P.O.:220] Out: -   Physical Exam: General:  Laying in bed, no acute distress  HEENT  normocephalic, atraumatic  Pulm/lungs  clear bilateral  CVS/Heart  regular  Abdomen:   Soft, nontender, nondistended  Extremities:  No lower extremity edema  Neurologic:  AAOx4  Skin:  No acute rashes, or lesions   Access: RIJ permcath 05/16/20 Dr. Lucky Cowboy    Basic Metabolic Panel: Recent Labs  Lab 06/01/20 1853 06/03/20 1242 06/04/20 0951  NA  --  139  --   K  --  3.8  --   CL  --  102  --   CO2  --  29  --   GLUCOSE  --  126*  --   BUN  --  19  --   CREATININE  --  1.48*  --   CALCIUM  --  7.9*  --   PHOS 3.6  --  4.7*    Liver Function Tests: No results for input(s): AST, ALT, ALKPHOS, BILITOT, PROT, ALBUMIN in the last 168 hours. No results for input(s): LIPASE, AMYLASE in the last 168 hours. No results for input(s): AMMONIA in the last 168 hours.  CBC: No results for input(s): WBC, NEUTROABS, HGB, HCT, MCV, PLT in the last 168 hours.  Cardiac Enzymes: No results for input(s): CKTOTAL, CKMB, CKMBINDEX, TROPONINI in the last 168 hours.  BNP: Invalid input(s): POCBNP  CBG: Recent Labs  Lab 05/30/20 0753 05/30/20 1600 05/30/20 2002 05/31/20 0810 06/01/20 2118  GLUCAP 97 90 136* 93 116*    Microbiology: Results for orders placed or performed  during the hospital encounter of 04/30/20  Resp Panel by RT-PCR (Flu A&B, Covid) Nasopharyngeal Swab     Status: None   Collection Time: 04/30/20  4:01 PM   Specimen: Nasopharyngeal Swab; Nasopharyngeal(NP) swabs in vial transport medium  Result Value Ref Range Status   SARS Coronavirus 2 by RT PCR NEGATIVE NEGATIVE Final    Comment: (NOTE) SARS-CoV-2 target nucleic acids are NOT DETECTED.  The SARS-CoV-2 RNA is generally detectable in upper respiratory specimens during the acute phase of infection. The lowest concentration of SARS-CoV-2 viral copies this assay can detect is 138 copies/mL. A negative result does not preclude SARS-Cov-2 infection and should not be used as the sole basis for treatment or other patient management decisions. A negative result may occur with  improper specimen collection/handling, submission of specimen other than nasopharyngeal swab, presence of viral mutation(s) within the areas targeted by this assay, and inadequate number of viral copies(<138 copies/mL). A negative result must be combined with clinical observations, patient history, and epidemiological information. The expected result is Negative.  Fact Sheet for Patients:  EntrepreneurPulse.com.au  Fact Sheet for Healthcare Providers:  IncredibleEmployment.be  This test is no t yet approved or cleared by the [place] FDA  and  has been authorized for detection and/or diagnosis of SARS-CoV-2 by FDA under an Emergency Use Authorization (EUA). This EUA will remain  in effect (meaning this test can be used) for the duration of the COVID-19 declaration under Section 564(b)(1) of the Act, 21 U.S.C.section 360bbb-3(b)(1), unless the authorization is terminated  or revoked sooner.       Influenza A by PCR NEGATIVE NEGATIVE Final   Influenza B by PCR NEGATIVE NEGATIVE Final    Comment: (NOTE) The Xpert Xpress SARS-CoV-2/FLU/RSV plus assay is intended as an  aid in the diagnosis of influenza from Nasopharyngeal swab specimens and should not be used as a sole basis for treatment. Nasal washings and aspirates are unacceptable for Xpert Xpress SARS-CoV-2/FLU/RSV testing.  Fact Sheet for Patients: EntrepreneurPulse.com.au  Fact Sheet for Healthcare Providers: IncredibleEmployment.be  This test is not yet approved or cleared by the [place] FDA and has been authorized for detection and/or diagnosis of SARS-CoV-2 by FDA under an Emergency Use Authorization (EUA). This EUA will remain in effect (meaning this test can be used) for the duration of the COVID-19 declaration under Section 564(b)(1) of the Act, 21 U.S.C. section 360bbb-3(b)(1), unless the authorization is terminated or revoked.  Performed at [facility], [address] [place], [place] [address]   Body fluid culture     Status: None   Collection Time: 05/02/20  4:44 PM   Specimen: PATH Cytology Pleural fluid  Result Value Ref Range Status   Specimen Description   Final    PLEURAL Performed at [facility], [address], [place], [place] [address]    Special Requests   Final    NONE Performed at [facility], [address] [place], [place] [address]    Gram Stain   Final    MODERATE WBC PRESENT, PREDOMINANTLY MONONUCLEAR NO ORGANISMS SEEN    Culture   Final    NO GROWTH Performed at [facility], [place] [address], [place], [place] [address]    Report Status 05/06/2020 FINAL  Final  MRSA PCR Screening     Status: None   Collection Time: 05/16/20  9:29 PM   Specimen: Nasal Mucosa; Nasopharyngeal  Result Value Ref Range Status   MRSA by PCR NEGATIVE NEGATIVE Final    Comment:        The GeneXpert MRSA Assay (FDA approved for NASAL specimens only), is one component of a comprehensive MRSA colonization surveillance program. It is not intended to diagnose MRSA infection nor to guide  or monitor treatment for MRSA infections. Performed at [facility], [address] [place], [place] [address]     Coagulation Studies: No results for input(s): LABPROT, INR in the last 72 hours.  Urinalysis: No results for input(s): COLORURINE, LABSPEC, PHURINE, GLUCOSEU, HGBUR, BILIRUBINUR, KETONESUR, PROTEINUR, UROBILINOGEN, NITRITE, LEUKOCYTESUR in the last 72 hours.  Invalid input(s): APPERANCEUR    Imaging: No results found.   Medications:   . sodium chloride     . amLODipine  5 mg Oral Daily  . benzonatate  200 mg Oral TID  . carvedilol  12.5 mg Oral BID WC  . Chlorhexidine Gluconate Cloth  6 each Topical Q0600  . cholecalciferol  1,000 Units Oral Daily  . epoetin (EPOGEN/PROCRIT) injection  4,000 Units Intravenous Q M,W,F-HD  . feeding supplement  237 mL Oral BID BM  . heparin  5,000 Units Subcutaneous Q8H  . lidocaine  1 patch Transdermal Q24H  . losartan  75 mg Oral  Daily  . melatonin  5 mg Oral QPM  . methocarbamol  500 mg Oral QID  . midodrine  5 mg Oral TID WC  . multivitamin  1 tablet Oral QHS  . polyethylene glycol  17 g Oral Daily  . senna-docusate  1 tablet Oral BID  . sevelamer carbonate  800 mg Oral TID WC  . sodium chloride flush  3 mL Intravenous Q12H  . topiramate  25 mg Oral BID  . torsemide  40 mg Oral Daily  . traZODone  50 mg Oral QPM  . vitamin B-12  100 mcg Oral Daily   sodium chloride, acetaminophen **OR** acetaminophen, ALPRAZolam, bisacodyl, chlorpheniramine-HYDROcodone, ondansetron **OR** ondansetron (ZOFRAN) IV, oxyCODONE-acetaminophen, promethazine, sodium chloride flush, SUMAtriptan  Assessment/ Plan:  [name] is a 65 y.o. white female with diastolic congestive heart failure, hypertension, diabetes mellitus type II who is admitted to [facility] on 04/30/2020 for Chronic kidney disease (CKD), stage IV (severe) ([place]) [N18.4] Volume overload [E87.70]  Patient initiated on hemodialysis this admission.   1. End stage  renal disease requiring dialysis Mondays and Fridays HD again on Monday.  2. Hypertension with lower extremity edema: blood pressure higher today.  Challenging situation as she has some element of autonomic neuropathy and orthostatic hypotension.  Has been reporting dizziness. -Started on midodrine on dialysis days. - decreased amlodipine to '5mg'$  daily yesterday  3. Anemia of chronic kidney disease:  Lab Results  Component Value Date   HGB 7.8 (L) 05/26/2020  EPO with HD  4. Secondary hyperparathyroidism Lab Results  Component Value Date   PTH 70 (H) 05/12/2020   CALCIUM 7.9 (L) 06/03/2020   PHOS 4.7 (H) 06/04/2020  - sevelamer with meals   5.  Diabetes type 2 with CKD - continue glucose control    LOS: 35 Katie [name] 1/23/20224:31 PM

## 2020-06-06 DIAGNOSIS — I953 Hypotension of hemodialysis: Secondary | ICD-10-CM

## 2020-06-06 LAB — DIFFERENTIAL
Abs Immature Granulocytes: 0.02 10*3/uL (ref 0.00–0.07)
Basophils Absolute: 0.1 10*3/uL (ref 0.0–0.1)
Basophils Relative: 1 %
Eosinophils Absolute: 0.2 10*3/uL (ref 0.0–0.5)
Eosinophils Relative: 2 %
Immature Granulocytes: 0 %
Lymphocytes Relative: 24 %
Lymphs Abs: 1.6 10*3/uL (ref 0.7–4.0)
Monocytes Absolute: 0.7 10*3/uL (ref 0.1–1.0)
Monocytes Relative: 10 %
Neutro Abs: 4.2 10*3/uL (ref 1.7–7.7)
Neutrophils Relative %: 63 %

## 2020-06-06 LAB — RENAL FUNCTION PANEL
Albumin: 2.3 g/dL — ABNORMAL LOW (ref 3.5–5.0)
Anion gap: 5 (ref 5–15)
BUN: 24 mg/dL — ABNORMAL HIGH (ref 8–23)
CO2: 31 mmol/L (ref 22–32)
Calcium: 8 mg/dL — ABNORMAL LOW (ref 8.9–10.3)
Chloride: 103 mmol/L (ref 98–111)
Creatinine, Ser: 1.4 mg/dL — ABNORMAL HIGH (ref 0.44–1.00)
GFR, Estimated: 43 mL/min — ABNORMAL LOW (ref >60)
Glucose, Bld: 144 mg/dL — ABNORMAL HIGH (ref 70–99)
Phosphorus: 3.2 mg/dL (ref 2.5–4.6)
Potassium: 3.5 mmol/L (ref 3.5–5.1)
Sodium: 139 mmol/L (ref 135–145)

## 2020-06-06 LAB — CBC
HCT: 26 % — ABNORMAL LOW (ref 36.0–46.0)
Hemoglobin: 8.5 g/dL — ABNORMAL LOW (ref 12.0–15.0)
MCH: 31.4 pg (ref 26.0–34.0)
MCHC: 32.7 g/dL (ref 30.0–36.0)
MCV: 95.9 fL (ref 80.0–100.0)
Platelets: 253 K/uL (ref 150–400)
RBC: 2.71 MIL/uL — ABNORMAL LOW (ref 3.87–5.11)
RDW: 13.9 % (ref 11.5–15.5)
WBC: 6.6 K/uL (ref 4.0–10.5)
nRBC: 0 % (ref 0.0–0.2)

## 2020-06-06 NOTE — Progress Notes (Signed)
Mobility Specialist - Progress Note   06/06/20 1600  Mobility  Activity Refused mobility  Mobility performed by Mobility specialist    Mobility attempt 2nd time this date. Pt currently using bed pan on arrival and requesting to come back later this date. Notified pt that a later session today would be unlikely and pt shows understanding. Pt requests mobility attempt early tomorrow afternoon. Will attempt session the next available date.    Kathee Delton Mobility Specialist 06/06/20, 5:01 PM

## 2020-06-06 NOTE — Progress Notes (Signed)
Patient's lines are reversed due to high AP, RN aware.

## 2020-06-06 NOTE — Progress Notes (Signed)
Mobility Specialist - Progress Note   06/06/20 1400  Mobility  Activity Off unit  Mobility performed by Mobility specialist    Pt off-unit for HD treatment upon arrival. Will attempt session at another date/time as time permits.    Kathee Delton Mobility Specialist 06/06/20, 2:21 PM

## 2020-06-06 NOTE — Progress Notes (Signed)
[facility]  ROUNDING NOTE   Subjective:   Seen and examined on hemodialysis treatment. Tolerating treatment well.     HEMODIALYSIS FLOWSHEET:  Blood Flow Rate (mL/min): 400 mL/min Arterial Pressure (mmHg): -140 mmHg Venous Pressure (mmHg): 90 mmHg Transmembrane Pressure (mmHg): 50 mmHg Ultrafiltration Rate (mL/min): 400 mL/min Dialysate Flow Rate (mL/min): 600 ml/min Conductivity: Machine : 14 Conductivity: Machine : 14 Dialysis Fluid Bolus: Normal Saline Bolus Amount (mL): 250 mL Dialysate Change: Other (comment)    Objective:  Vital signs in last 24 hours:  Temp:  [98.1 F (36.7 C)-98.8 F (37.1 C)] 98.8 F (37.1 C) (01/24 1059) Pulse Rate:  [64-75] 66 (01/24 1200) Resp:  [12-18] 12 (01/24 1200) BP: (123-162)/(56-73) 129/66 (01/24 1200) SpO2:  [95 %-97 %] 95 % (01/24 0822)  Weight change:  Filed Weights   06/01/20 1654 06/02/20 0201 06/03/20 0539  Weight: 75.7 kg 75.7 kg 75.9 kg    Intake/Output: I/O last 3 completed shifts: In: 223 [P.O.:220; I.V.:3] Out: 1400 [Urine:1400]   Intake/Output this shift:  Total I/O In: 240 [P.O.:240] Out: -   Physical Exam: General:  Laying in bed, no acute distress  HEENT  normocephalic, atraumatic  Pulm/lungs  clear bilateral  CVS/Heart  regular  Abdomen:   Soft, nontender, nondistended  Extremities:  No lower extremity edema  Neurologic:  AAOx4  Skin:  No acute rashes, or lesions   Access: RIJ permcath 05/16/20 Dr. Lucky Cowboy    Basic Metabolic Panel: Recent Labs  Lab 06/01/20 1853 06/03/20 1242 06/04/20 0951  NA  --  139  --   K  --  3.8  --   CL  --  102  --   CO2  --  29  --   GLUCOSE  --  126*  --   BUN  --  19  --   CREATININE  --  1.48*  --   CALCIUM  --  7.9*  --   PHOS 3.6  --  4.7*    Liver Function Tests: No results for input(s): AST, ALT, ALKPHOS, BILITOT, PROT, ALBUMIN in the last 168 hours. No results for input(s): LIPASE, AMYLASE in the last 168 hours. No results for input(s):  AMMONIA in the last 168 hours.  CBC: No results for input(s): WBC, NEUTROABS, HGB, HCT, MCV, PLT in the last 168 hours.  Cardiac Enzymes: No results for input(s): CKTOTAL, CKMB, CKMBINDEX, TROPONINI in the last 168 hours.  BNP: Invalid input(s): POCBNP  CBG: Recent Labs  Lab 05/30/20 1600 05/30/20 2002 05/31/20 0810 06/01/20 2118  GLUCAP 90 136* 93 116*    Microbiology: Results for orders placed or performed during the hospital encounter of 04/30/20  Resp Panel by RT-PCR (Flu A&B, Covid) Nasopharyngeal Swab     Status: None   Collection Time: 04/30/20  4:01 PM   Specimen: Nasopharyngeal Swab; Nasopharyngeal(NP) swabs in vial transport medium  Result Value Ref Range Status   SARS Coronavirus 2 by RT PCR NEGATIVE NEGATIVE Final    Comment: (NOTE) SARS-CoV-2 target nucleic acids are NOT DETECTED.  The SARS-CoV-2 RNA is generally detectable in upper respiratory specimens during the acute phase of infection. The lowest concentration of SARS-CoV-2 viral copies this assay can detect is 138 copies/mL. A negative result does not preclude SARS-Cov-2 infection and should not be used as the sole basis for treatment or other patient management decisions. A negative result may occur with  improper specimen collection/handling, submission of specimen other than nasopharyngeal swab, presence of viral mutation(s) within the  areas targeted by this assay, and inadequate number of viral copies(<138 copies/mL). A negative result must be combined with clinical observations, patient history, and epidemiological information. The expected result is Negative.  Fact Sheet for Patients:  EntrepreneurPulse.com.au  Fact Sheet for Healthcare Providers:  IncredibleEmployment.be  This test is no t yet approved or cleared by the [place] FDA and  has been authorized for detection and/or diagnosis of SARS-CoV-2 by FDA under an Emergency Use Authorization  (EUA). This EUA will remain  in effect (meaning this test can be used) for the duration of the COVID-19 declaration under Section 564(b)(1) of the Act, 21 U.S.C.section 360bbb-3(b)(1), unless the authorization is terminated  or revoked sooner.       Influenza A by PCR NEGATIVE NEGATIVE Final   Influenza B by PCR NEGATIVE NEGATIVE Final    Comment: (NOTE) The Xpert Xpress SARS-CoV-2/FLU/RSV plus assay is intended as an aid in the diagnosis of influenza from Nasopharyngeal swab specimens and should not be used as a sole basis for treatment. Nasal washings and aspirates are unacceptable for Xpert Xpress SARS-CoV-2/FLU/RSV testing.  Fact Sheet for Patients: EntrepreneurPulse.com.au  Fact Sheet for Healthcare Providers: IncredibleEmployment.be  This test is not yet approved or cleared by the [place] FDA and has been authorized for detection and/or diagnosis of SARS-CoV-2 by FDA under an Emergency Use Authorization (EUA). This EUA will remain in effect (meaning this test can be used) for the duration of the COVID-19 declaration under Section 564(b)(1) of the Act, 21 U.S.C. section 360bbb-3(b)(1), unless the authorization is terminated or revoked.  Performed at [facility], [address] [place], [place] [address]   Body fluid culture     Status: None   Collection Time: 05/02/20  4:44 PM   Specimen: PATH Cytology Pleural fluid  Result Value Ref Range Status   Specimen Description   Final    PLEURAL Performed at [facility], [address], [place], [place] [address]    Special Requests   Final    NONE Performed at [facility], [address] [place], [place] [address]    Gram Stain   Final    MODERATE WBC PRESENT, PREDOMINANTLY MONONUCLEAR NO ORGANISMS SEEN    Culture   Final    NO GROWTH Performed at [facility], [place] [address], [place], [place] [address]    Report Status 05/06/2020  FINAL  Final  MRSA PCR Screening     Status: None   Collection Time: 05/16/20  9:29 PM   Specimen: Nasal Mucosa; Nasopharyngeal  Result Value Ref Range Status   MRSA by PCR NEGATIVE NEGATIVE Final    Comment:        The GeneXpert MRSA Assay (FDA approved for NASAL specimens only), is one component of a comprehensive MRSA colonization surveillance program. It is not intended to diagnose MRSA infection nor to guide or monitor treatment for MRSA infections. Performed at [facility], [address] [place],  [address]     Coagulation Studies: No results for input(s): LABPROT, INR in the last 72 hours.  Urinalysis: No results for input(s): COLORURINE, LABSPEC, PHURINE, GLUCOSEU, HGBUR, BILIRUBINUR, KETONESUR, PROTEINUR, UROBILINOGEN, NITRITE, LEUKOCYTESUR in the last 72 hours.  Invalid input(s): APPERANCEUR    Imaging: No results found.   Medications:   . sodium chloride     . amLODipine  5 mg Oral Daily  . benzonatate  200 mg Oral TID  . carvedilol  12.5 mg Oral BID WC  .  Chlorhexidine Gluconate Cloth  6 each Topical Q0600  . cholecalciferol  1,000 Units Oral Daily  . epoetin (EPOGEN/PROCRIT) injection  10,000 Units Intravenous Q M,W,F-HD  . feeding supplement  237 mL Oral BID BM  . heparin  5,000 Units Subcutaneous Q8H  . lidocaine  1 patch Transdermal Q24H  . losartan  75 mg Oral Daily  . melatonin  5 mg Oral QPM  . methocarbamol  500 mg Oral QID  . midodrine  5 mg Oral TID WC  . multivitamin  1 tablet Oral QHS  . polyethylene glycol  17 g Oral Daily  . senna-docusate  1 tablet Oral BID  . sevelamer carbonate  800 mg Oral TID WC  . sodium chloride flush  3 mL Intravenous Q12H  . topiramate  25 mg Oral BID  . torsemide  40 mg Oral Daily  . traZODone  50 mg Oral QPM  . vitamin B-12  100 mcg Oral Daily   sodium chloride, acetaminophen **OR** acetaminophen, ALPRAZolam, bisacodyl, chlorpheniramine-HYDROcodone, ondansetron **OR** ondansetron  (ZOFRAN) IV, oxyCODONE-acetaminophen, promethazine, sodium chloride flush, SUMAtriptan  Assessment/ Plan:  [name] is a 65 y.o. white female with diastolic congestive heart failure, hypertension, diabetes mellitus type II who is admitted to [facility] on 04/30/2020 for Chronic kidney disease (CKD), stage IV (severe) ([place]) [N18.4] Volume overload [E87.70]  Patient initiated on hemodialysis this admission.   1. End stage renal disease  - Continue MWF schedule. Outpatient planning in process  2. Hypertension with lower extremity edema:   -Started on midodrine on dialysis days.  3. Anemia of chronic kidney disease:  Lab Results  Component Value Date   HGB 7.8 (L) 05/26/2020  EPO with HD  4. Secondary hyperparathyroidism Lab Results  Component Value Date   PTH 70 (H) 05/12/2020   CALCIUM 7.9 (L) 06/03/2020   PHOS 4.7 (H) 06/04/2020  - sevelamer with meals    LOS: 36 Katie [name] 1/24/202212:21 PM

## 2020-06-06 NOTE — Progress Notes (Signed)
PROGRESS NOTE    Katie [name]  [id number] DOB: 09/28/1955 DOA: 04/30/2020 PCP: Patient, [name]    Brief Narrative:  Katie [name] a 65 y.o.female with a history of diabetes mellitus type 2, stage IV CKD, heart failure, hypertension. Patient presented secondary to dyspnea with evidence of volume overload and pleural effusion.  Patient has had an extended hospital course.  Her acute medical issues have essentially resolved at this point.  She is medically stable for discharge however we do not have a safe disposition plan.  Patient does not have stable living and lives with a friend here.  She states that the friend likely will not be able to care for her.  TOC working to find placement however multiple facilities have refused at this point.  Senior case management involved.  Expanding search.  Patient remains stable.  Continues to be very unmotivated always having pain complaints when PT attempted to work with her however then later complains of buttock pain from sitting too much.  I try to reinforce the need for exertion and ambulation daily basis.   1/19- Had HD today. +orthostatics on PT evaluation today. Pt reports being symptomatic when bp drops  1/20-has no complaints this am. No overnight issues. 1/21-pt seen in HD today. Has no complaints.  1/22-pt had HD yesterday. She was positive for orthostatics, bp supine 122/51 with standing dropped 77/47 and was symptomatic.  1/23 no overnight issues, HD tomorrow. 1/24-dialysis today.  Declined PT today Consultants:   nephrology  Procedures: HD  Antimicrobials:       Subjective: Has no complaints for me this a.m. however declined PT for multiple reasons  Objective: Vitals:   06/05/20 0809 06/05/20 1629 06/05/20 2140 06/06/20 0447  BP: (!) 152/68 (!) 162/71 (!) 141/56 (!) 123/57  Pulse: 70 75 70 64  Resp: '16 18 16 16  '$ Temp: 98.3 F (36.8 C) 98.2 F (36.8 C) 98.5 F (36.9 C) 98.1 F (36.7 C)  TempSrc:    Oral Oral  SpO2: 92% 96% 96% 97%  Weight:      Height:        Intake/Output Summary (Last 24 hours) at 06/06/2020 0758 Last data filed at 06/06/2020 0447 Gross per 24 hour  Intake 220 ml  Output 600 ml  Net -380 ml   Filed Weights   06/01/20 1654 06/02/20 0201 06/03/20 0539  Weight: 75.7 kg 75.7 kg 75.9 kg    Examination: Calm, NAD  CTA, no wheeze rales rhonchi's Regular S1-S2 no gallops Abdomen soft benign positive bowel sounds No edema Alert oriented x3 grossly intact   Data Reviewed: I have personally reviewed following labs and imaging studies  CBC: No results for input(s): WBC, NEUTROABS, HGB, HCT, MCV, PLT in the last 168 hours. Basic Metabolic Panel: Recent Labs  Lab 06/01/20 1853 06/03/20 1242 06/04/20 0951  NA  --  139  --   K  --  3.8  --   CL  --  102  --   CO2  --  29  --   GLUCOSE  --  126*  --   BUN  --  19  --   CREATININE  --  1.48*  --   CALCIUM  --  7.9*  --   PHOS 3.6  --  4.7*   GFR: Estimated Creatinine Clearance: 41 mL/min (A) (by C-G formula based on SCr of 1.48 mg/dL (H)). Liver Function Tests: No results for input(s): AST, ALT, ALKPHOS, BILITOT, PROT, ALBUMIN in the last  168 hours. No results for input(s): LIPASE, AMYLASE in the last 168 hours. No results for input(s): AMMONIA in the last 168 hours. Coagulation Profile: No results for input(s): INR, PROTIME in the last 168 hours. Cardiac Enzymes: No results for input(s): CKTOTAL, CKMB, CKMBINDEX, TROPONINI in the last 168 hours. BNP (last 3 results) No results for input(s): PROBNP in the last 8760 hours. HbA1C: No results for input(s): HGBA1C in the last 72 hours. CBG: Recent Labs  Lab 05/30/20 1600 05/30/20 2002 05/31/20 0810 06/01/20 2118  GLUCAP 90 136* 93 116*   Lipid Profile: No results for input(s): CHOL, HDL, LDLCALC, TRIG, CHOLHDL, LDLDIRECT in the last 72 hours. Thyroid Function Tests: No results for input(s): TSH, T4TOTAL, FREET4, T3FREE, THYROIDAB in the last  72 hours. Anemia Panel: No results for input(s): VITAMINB12, FOLATE, FERRITIN, TIBC, IRON, RETICCTPCT in the last 72 hours. Sepsis Labs: No results for input(s): PROCALCITON, LATICACIDVEN in the last 168 hours.  No results found for this or any previous visit (from the past 240 hour(s)).       Radiology Studies: No results found.      Scheduled Meds: . amLODipine  5 mg Oral Daily  . benzonatate  200 mg Oral TID  . carvedilol  12.5 mg Oral BID WC  . Chlorhexidine Gluconate Cloth  6 each Topical Q0600  . cholecalciferol  1,000 Units Oral Daily  . epoetin (EPOGEN/PROCRIT) injection  10,000 Units Intravenous Q M,W,F-HD  . feeding supplement  237 mL Oral BID BM  . heparin  5,000 Units Subcutaneous Q8H  . lidocaine  1 patch Transdermal Q24H  . losartan  75 mg Oral Daily  . melatonin  5 mg Oral QPM  . methocarbamol  500 mg Oral QID  . midodrine  5 mg Oral TID WC  . multivitamin  1 tablet Oral QHS  . polyethylene glycol  17 g Oral Daily  . senna-docusate  1 tablet Oral BID  . sevelamer carbonate  800 mg Oral TID WC  . sodium chloride flush  3 mL Intravenous Q12H  . topiramate  25 mg Oral BID  . torsemide  40 mg Oral Daily  . traZODone  50 mg Oral QPM  . vitamin B-12  100 mcg Oral Daily   Continuous Infusions: . sodium chloride      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)   Hyperkalemia   Pressure injury of skin   Pleural effusion   Acute heart failure with preserved ejection fraction (HFpEF) (HCC)   Chronic kidney disease (CKD), stage IV (severe) (HCC)   Pulmonary hypertension, unspecified (HCC)   Volume overload In setting of CKD. Cardiomegaly on chest x-ray.  Started on Lasix IV on admission but has now progressed to requiring hemodialysis. CKD likely progressed to ESRD 12/24-stable  asymptomatic and euvolemic on exam On hemodialysis Had permacath placed to the right chest Patient has dialysis chair set up when patient is discharged   Diabetes mellitus, type 2 Hemoglobin A1C of 4.7%.  1/24 -blood glucose levels have remained stable  Diet controlled    Pleural effusion In setting of fluid overload with new diagnosis of grade 2 diastolic dysfunction  Echo 05/01/2020 with normal EF  Status post diagnostic right thoracentesis performed on 12/20  No evidence of malignancy on cytology  Orthostatic hypotension- becomes symptomatic. SBP standing in 70's. Added midrodrine standing dose Amlodipine dose decrease Ted stocking Continue to monitor, if need to will increase midodrine dose    ESRD-MWF- Unknown baseline. 2/2 HTN and atherosclerosis Hx/o NSADS Patient is unsure of her primary nephrologist in [place].  Creatinine of 2.97 on admission.  Per patient, she was planned to start hemodialysis in addition to consideration for renal transplant.  Nephrology consulted on 12/20.  Worsening renal function in setting of attempted Lasix diuresis for heart failure. Nephrology now started hemodialysis.  1/24-hemodialysis today Patient on midodrine 10 mg on dialysis days due to orthostatic hypotension which she becomes very symptomatic Was started on midodrine 5 mg p.o. 3 times daily on 1/22 besides the 10 mg given on dialysis day Expected [place] Pending financial clearance for discharge planning TOC attempting disposition to SNF Nephrology following          Acute diastolic heart failure New diagnosis. Grade 2 dysfunction. Normal EF.  Associated RV systolic dysfunction with elevated PA pressure seen on TTE although RHC on 12/28 significant for mildly elevated right heart/pulmonary artery pressures.  1/23-resolved, remains euvolemic  On torsemide per nephrology's recommendation  Daily weight  I's and O's    Primary  hypertension Patient is on at least hydrochlorothiazide as an outpatient but is unsure of her regimen.  Stable, but has orthostatic hypotension, challenging to control Started on midodrine as above Decrease amlodipine to 5 mg daily New losartan, torsemide, and carvedilol    Moderate pericardial effusion No clinical evidence of tamponade.  No recent infections.  In setting of CKD V with elevated BUN. No chest pain. -cardiology rec. Outpatient Echo for surveillance Hemodynamics stable , continue to monitor   Mild/moderate aortic stenosis Unsure if this is symptomatic.  Patient does have dyspnea with exertion but this may be multifactorial. -Cardiology recommendations: outpatient Transthoracic Echocardiogram for surveillance    Anemia Possible related to underlying kidney disease.  Evidence of macrocytosis on CBC.  TIBC low but otherwise normal iron panel. Vitamin B12 level 358, target > 400, started oral supplement and folate normal.  Hyperkalemia In setting of CKD. Lokelma 10 g given with improvement.  Pressure injury Mid coccyx, POA  History of tobacco use Patient has quit for at least 10 years. Previously, 20 pack year history.  Headache Normal chronic headache. Patient states she takes Imitrex as needed.  Continue Tylenol and oxycodone as needed 1/8 s/p Fioricet 1 dose 1/9 IV Solu-Medrol 60 mg x 1 dose, Benadryl 25 mg IV x1 dose and Phenergan 25 mg IV x1 dose given 1/10Headache improved,patient slept well it seems insomnia is contributing to headache  Insomnia, started trazodone and melatonin Continue melatonin and trazodone nightly,seems to be helping patient to sleep   Obesity Body mass index is 30.91 kg/m.   DVT prophylaxis: Heparin Code Status: Full Family Communication: None at bedside  Status is: Inpatient  Remains inpatient appropriate because:Unsafe d/c plan   Dispo: The patient is from: Home              Anticipated d/c is  to: SNF              Anticipated d/c date is: TBD              Patient currently is medically stable to d/c.  Patient currently medically stable for discharge.  Outpatient hemodialysis chair has been arranged.  SNF is pending and that is a barrier to discharge.  Continue case management administration involved.  Disposition plan pending.            LOS: 36 days   Time spent: 35 min with >50% on coc    Nolberto Hanlon, MD [facility] Pager [phone]  If 7PM-7AM, please contact night-coverage 06/06/2020, 7:58 AM   PROGRESS NOTE    Katie [name]  [id number] DOB: Aug 16, 1955 DOA: 04/30/2020 PCP: Patient, [name]    Brief Narrative:  Katie [name] a 65 y.o.female with a history of diabetes mellitus type 2, stage IV CKD, heart failure, hypertension. Patient presented secondary to dyspnea with evidence of volume overload and pleural effusion.  Patient has had an extended hospital course.  Her acute medical issues have essentially resolved at this point.  She is medically stable for discharge however we do not have a safe disposition plan.  Patient does not have stable living and lives with a friend here.  She states that the friend likely will not be able to care for her.  TOC working to find placement however multiple facilities have refused at this point.  Senior case management involved.  Expanding search.  Patient remains stable.  Continues to be very unmotivated always having pain complaints when PT attempted to work with her however then later complains of buttock pain from sitting too much.  I try to reinforce the need for exertion and ambulation daily basis.   1/19- Had HD today. +orthostatics on PT evaluation today. Pt reports being symptomatic when bp drops  1/20-has no complaints this am. No overnight issues. 1/21-pt seen in HD today. Has no complaints.  1/22-pt had HD yesterday. She was positive for orthostatics, bp supine 122/51 with standing  dropped 77/47 and was symptomatic.   Consultants:   nephrology  Procedures: HD  Antimicrobials:       Subjective: Has no dizziness while laying down. No sob or cp  Objective: Vitals:   06/05/20 0809 06/05/20 1629 06/05/20 2140 06/06/20 0447  BP: (!) 152/68 (!) 162/71 (!) 141/56 (!) 123/57  Pulse: 70 75 70 64  Resp: '16 18 16 16  '$ Temp: 98.3 F (36.8 C) 98.2 F (36.8 C) 98.5 F (36.9 C) 98.1 F (36.7 C)  TempSrc:   Oral Oral  SpO2: 92% 96% 96% 97%  Weight:      Height:        Intake/Output Summary (Last 24 hours) at 06/06/2020 0758 Last data filed at 06/06/2020 0447 Gross per 24 hour  Intake 220 ml  Output 600 ml  Net -380 ml   Filed Weights   06/01/20 1654 06/02/20 0201 06/03/20 0539  Weight: 75.7 kg 75.7 kg 75.9 kg    Examination: Calm, comfortable, NAD CTA, no R/W/R Regular S1-S2 no gallops Soft nontender nondistended positive bowel sounds Trace edema bilateral lower extremity Alert oriented x3, grossly intact Mood and affect appropriate in current setting  Data Reviewed: I have personally reviewed following labs and imaging studies  CBC: No results for input(s): WBC, NEUTROABS, HGB, HCT, MCV, PLT in the last 168 hours. Basic Metabolic Panel: Recent Labs  Lab 06/01/20 1853 06/03/20 1242 06/04/20 0951  NA  --  139  --   K  --  3.8  --   CL  --  102  --   CO2  --  29  --   GLUCOSE  --  126*  --   BUN  --  19  --   CREATININE  --  1.48*  --   CALCIUM  --  7.9*  --  PHOS 3.6  --  4.7*   GFR: Estimated Creatinine Clearance: 41 mL/min (A) (by C-G formula based on SCr of 1.48 mg/dL (H)). Liver Function Tests: No results for input(s): AST, ALT, ALKPHOS, BILITOT, PROT, ALBUMIN in the last 168 hours. No results for input(s): LIPASE, AMYLASE in the last 168 hours. No results for input(s): AMMONIA in the last 168 hours. Coagulation Profile: No results for input(s): INR, PROTIME in the last 168 hours. Cardiac Enzymes: No results for input(s):  CKTOTAL, CKMB, CKMBINDEX, TROPONINI in the last 168 hours. BNP (last 3 results) No results for input(s): PROBNP in the last 8760 hours. HbA1C: No results for input(s): HGBA1C in the last 72 hours. CBG: Recent Labs  Lab 05/30/20 1600 05/30/20 2002 05/31/20 0810 06/01/20 2118  GLUCAP 90 136* 93 116*   Lipid Profile: No results for input(s): CHOL, HDL, LDLCALC, TRIG, CHOLHDL, LDLDIRECT in the last 72 hours. Thyroid Function Tests: No results for input(s): TSH, T4TOTAL, FREET4, T3FREE, THYROIDAB in the last 72 hours. Anemia Panel: No results for input(s): VITAMINB12, FOLATE, FERRITIN, TIBC, IRON, RETICCTPCT in the last 72 hours. Sepsis Labs: No results for input(s): PROCALCITON, LATICACIDVEN in the last 168 hours.  No results found for this or any previous visit (from the past 240 hour(s)).       Radiology Studies: No results found.      Scheduled Meds: . amLODipine  5 mg Oral Daily  . benzonatate  200 mg Oral TID  . carvedilol  12.5 mg Oral BID WC  . Chlorhexidine Gluconate Cloth  6 each Topical Q0600  . cholecalciferol  1,000 Units Oral Daily  . epoetin (EPOGEN/PROCRIT) injection  10,000 Units Intravenous Q M,W,F-HD  . feeding supplement  237 mL Oral BID BM  . heparin  5,000 Units Subcutaneous Q8H  . lidocaine  1 patch Transdermal Q24H  . losartan  75 mg Oral Daily  . melatonin  5 mg Oral QPM  . methocarbamol  500 mg Oral QID  . midodrine  5 mg Oral TID WC  . multivitamin  1 tablet Oral QHS  . polyethylene glycol  17 g Oral Daily  . senna-docusate  1 tablet Oral BID  . sevelamer carbonate  800 mg Oral TID WC  . sodium chloride flush  3 mL Intravenous Q12H  . topiramate  25 mg Oral BID  . torsemide  40 mg Oral Daily  . traZODone  50 mg Oral QPM  . vitamin B-12  100 mcg Oral Daily   Continuous Infusions: . sodium chloride      Assessment & Plan:   Principal Problem:   Volume overload Active Problems:   Type 2 diabetes mellitus with stage 5 chronic  kidney disease (HCC)   Hypertension   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HCC)   Hyperkalemia   Pressure injury of skin   Pleural effusion   Acute heart failure with preserved ejection fraction (HFpEF) (HCC)   Chronic kidney disease (CKD), stage IV (severe) (HCC)   Pulmonary hypertension, unspecified (HCC)   Volume overload In setting of CKD. Cardiomegaly on chest x-ray.  Started on Lasix IV on admission but has now progressed to requiring hemodialysis. CKD likely progressed to ESRD 1/22-euvolemic on exam Getting dialysis Had permacath placed to the right chest Patient has dialysis chair set up 1 patient is discharged      Pleural effusion In setting of fluid overload with new diagnosis of grade 2 diastolic dysfunction  Echo 05/01/2020 with normal EF Status  post diagnostic right thoracentesis performed on 12/20 No evidence of malignancy on cytology       Diabetes mellitus, type 2 Hemoglobin A1C of 4.7%.  1/22-BG controlled  Diet controlled      ESRD-MWF- Unknown baseline. 2/2 HTN and atherosclerosis Hx/o NSADS Patient is unsure of her primary nephrologist in [place].  Creatinine of 2.97 on admission.  Per patient, she was planned to start hemodialysis in addition to consideration for renal transplant.  Nephrology consulted on 12/20.  Worsening renal function in setting of attempted Lasix diuresis for heart failure. Nephrology now started hemodialysis. 1/22-had dialysis yesterday.  Does become orthostatics with dialysis and is on midodrine. Will increase midodrine to 5 mg p.o. 3 times daily except for Monday Wednesday Friday dialysis today where she needs to get 10 mg as previous order Expected [place] Pending financial clearance for discharge planning TOC attempting disposition to SNF Nephrology following         Acute diastolic heart failure New diagnosis. Grade 2 dysfunction. Normal EF.  Associated RV systolic  dysfunction with elevated PA pressure seen on TTE although RHC on 12/28 significant for mildly elevated right heart/pulmonary artery pressures.  resolved, remains euvolemic Continue torsemide per nephrology's recommendation Daily weight I's and O's      Moderate pericardial effusion No clinical evidence of tamponade.  No recent infections.  In setting of CKD V with elevated BUN. No chest pain. -cardiology rec. Outpatient Echo for surveillance Hemodynamics stable , continue to monitor   Mild/moderate aortic stenosis Unsure if this is symptomatic.  Patient does have dyspnea with exertion but this may be multifactorial. -Cardiology recommendations: outpatient Transthoracic Echocardiogram for surveillance  Primary hypertension Patient is on at least hydrochlorothiazide as an outpatient but is unsure of her regimen. Improved control -Losartan, carvedilol, amlodipine -Lasix as above  Anemia Possible related to underlying kidney disease.  Evidence of macrocytosis on CBC.  TIBC low but otherwise normal iron panel. Vitamin B12 level 358, target > 400, started oral supplement and folate normal.  Hyperkalemia In setting of CKD. Lokelma 10 g given with improvement.  Pressure injury Mid coccyx, POA  History of tobacco use Patient has quit for at least 10 years. Previously, 20 pack year history.  Headache Normal chronic headache. Patient states she takes Imitrex as needed.  Continue Tylenol and oxycodone as needed 1/8 s/p Fioricet 1 dose 1/9 IV Solu-Medrol 60 mg x 1 dose, Benadryl 25 mg IV x1 dose and Phenergan 25 mg IV x1 dose given 1/10Headache improved,patient slept well it seems insomnia is contributing to headache  Insomnia, started trazodone and melatonin Continue melatonin and trazodone nightly,seems to be helping patient to sleep   Obesity Body mass index is 30.91 kg/m.   DVT prophylaxis: Heparin Code Status: Full Family Communication: None at  bedside  Status is: Inpatient  Remains inpatient appropriate because:Unsafe d/c plan   Dispo: The patient is from: Home              Anticipated d/c is to: SNF              Anticipated d/c date is: > 3 days              Patient currently is medically stable to d/c.  Patient currently medically stable for discharge.  Outpatient hemodialysis chair has been arranged.  SNF is pending and that is a barrier to discharge.  Continue case management administration involved.  Disposition plan pending.  LOS: 36 days   Time spent: 35 min with >50% on coc    Nolberto Hanlon, MD [facility] Pager [phone]  If 7PM-7AM, please contact night-coverage 06/06/2020, 7:58 AM

## 2020-06-06 NOTE — Progress Notes (Signed)
PT Cancellation Note  Patient Details Name: Katie [name] MRN: [id number] DOB: August 26, 1955   Cancelled Treatment:    Reason Eval/Treat Not Completed: Patient declined participation with PT services this session secondary to HA, back pain, and nausea, nursing notified.  Will attempt to see pt at a future date/time as medically appropriate.     Linus Salmons PT, DPT 06/06/20, 10:29 AM

## 2020-06-07 NOTE — TOC Progression Note (Signed)
Transition of Care [facility]) - Progression Note    Patient Details  Name: Katie [name] MRN: [id number] Date of Birth: 1956/02/28  Transition of Care [facility]) CM/SW [place], RN Phone Number: 06/07/2020, 9:59 AM  Clinical Narrative:    LVMM for Kathrynn Ducking @ [facility] [phone] requesting update on LOG request.   Will continue searching for other SNF facilities willing to take LOG.    Expected Discharge Plan: Skilled Nursing Facility Barriers to Discharge: Continued Medical Work up,Homeless with medical needs  Expected Discharge Plan and Services Expected Discharge Plan: [place] In-house Referral: Clinical Social Work Discharge Planning Services: Homebound not met per provider Post Acute Care Choice: [place] Living arrangements for the past 2 months: No permanent address (Lived with Sister and family)                 DME Arranged: N/A DME Agency: NA       HH Arranged: NA HH Agency: NA         Social Determinants of Health (SDOH) Interventions    Readmission Risk Interventions No flowsheet data found.

## 2020-06-07 NOTE — TOC Progression Note (Signed)
Transition of Care [facility]) - Progression Note    Patient Details  Name: Katie [name] MRN: [id number] Date of Birth: [birth year]  Transition of Care [facility]) CM/SW [place], RN Phone Number: 06/07/2020, 12:56 PM  Clinical Narrative:    LVMM for Shara Blazing Director @ [phone] requesting update on request for LOG.   Expected Discharge Plan: Skilled Nursing Facility Barriers to Discharge: Continued Medical Work up,Homeless with medical needs  Expected Discharge Plan and Services Expected Discharge Plan: [place] In-house Referral: Clinical Social Work Discharge Planning Services: Homebound not met per provider Post Acute Care Choice: [place] Living arrangements for the past 2 months: No permanent address (Lived with Sister and family)                 DME Arranged: N/A DME Agency: NA       HH Arranged: NA HH Agency: NA         Social Determinants of Health (SDOH) Interventions    Readmission Risk Interventions No flowsheet data found.

## 2020-06-07 NOTE — Progress Notes (Signed)
Physical Therapy Treatment Patient Details Name: Katie [name] MRN: [id number] DOB: 01-06-1956 Today's Date: 06/07/2020    History of Present Illness Patient is a 65 y.o. female with a history of diabetes mellitus type 2, stage IV CKD, heart failure, hypertension. Patient presented secondary to dyspnea with evidence of volume overload and pleural effusion. Diagnosed with moderate pericardial effusion, mild to moderate aortic insufficiency and aortic stenosis.  S/p R heart cath 12/28.  R temp fem cath placed 12/29; 1/3 R IJ permcath placed; 1/4 R temp fem cath removed.    PT Comments    Pt was pleasant and motivated to participate during the session but ultimately was limited by orthostatic hypotension.  Pt's BP in supine 157/65 with HR 71, sitting 136/61 with HR 72, and standing 87/48 with HR 67, nsg notified.  Pt mildly symptomatic going from sup to sit and more moderately symptomatic going from sitting to standing.  Pt required extra time and effort with bed mobility tasks but no physical assistance.  Pt required significant effort to come to standing and then min A to prevent posterior LOB upon initial stand but static standing balance improved to CGA once pt was steady.  Pt will benefit from PT services in a SNF setting upon discharge to safely address deficits listed in patient problem list for decreased caregiver assistance and eventual return to PLOF.     Follow Up Recommendations  SNF;Supervision for mobility/OOB     Equipment Recommendations  Rolling walker with 5" wheels;3in1 (PT)    Recommendations for Other Services       Precautions / Restrictions Precautions Precautions: Fall Precaution Comments: R IJ permcath Restrictions Weight Bearing Restrictions: No Other Position/Activity Restrictions: Pt Orthostatic, monitor BP with change of position    Mobility  Bed Mobility Overal bed mobility: Modified Independent             General bed mobility comments: Extra time  and effor only  Transfers Overall transfer level: Needs assistance Equipment used: Rolling walker (2 wheeled) Transfers: Sit to/from Stand Sit to Stand: From elevated surface;Min assist         General transfer comment: Pt required significant effort to come to standing and then min A to prevent posterior LOB upon initial stand  Ambulation/Gait             General Gait Details: Deferred secodnary to orthostatic hypotension per above   Stairs             Wheelchair Mobility    Modified Rankin (Stroke Patients Only)       Balance Overall balance assessment: Needs assistance Sitting-balance support: Feet supported Sitting balance-Leahy Scale: Good     Standing balance support: Bilateral upper extremity supported;During functional activity Standing balance-Leahy Scale: Poor Standing balance comment: Min A for stability upon coming to initial stand                            Cognition Arousal/Alertness: Awake/alert Behavior During Therapy: WFL for tasks assessed/performed Overall Cognitive Status: Within Functional Limits for tasks assessed                                        Exercises Total Joint Exercises Ankle Circles/Pumps: AROM;Strengthening;Both;10 reps;15 reps Quad Sets: Strengthening;Both;10 reps;15 reps Gluteal Sets: Strengthening;Both;10 reps;15 reps Towel Squeeze: Strengthening;Both;10 reps Hip ABduction/ADduction: Strengthening;Both;10 reps;15 reps Long Arc Quad:  AROM;Strengthening;Both;10 reps;15 reps Knee Flexion: AROM;Strengthening;Both;10 reps;15 reps Other Exercises Other Exercises: HEP education and review for BLE APs, QS, GS, hip abd/add x 10 each 5-6x/day as tolerated    General Comments        Pertinent Vitals/Pain Pain Assessment: 0-10 Pain Score: 6  Pain Location: HA Pain Descriptors / Indicators: Headache Pain Intervention(s): Premedicated before session;Monitored during session    Home  Living                      Prior Function            PT Goals (current goals can now be found in the care plan section) Progress towards PT goals: Not progressing toward goals - comment (limited by orthostatic hypotension)    Frequency    Min 2X/week      PT Plan Current plan remains appropriate    Co-evaluation              AM-PAC PT "6 Clicks" Mobility   Outcome Measure  Help needed turning from your back to your side while in a flat bed without using bedrails?: A Little Help needed moving from lying on your back to sitting on the side of a flat bed without using bedrails?: A Little Help needed moving to and from a bed to a chair (including a wheelchair)?: A Little Help needed standing up from a chair using your arms (e.g., wheelchair or bedside chair)?: A Little Help needed to walk in hospital room?: A Lot Help needed climbing 3-5 steps with a railing? : A Lot 6 Click Score: 16    End of Session Equipment Utilized During Treatment: Gait belt Activity Tolerance: Treatment limited secondary to medical complications (Comment) Patient left: in bed;with call bell/phone within reach;with bed alarm set;Other (comment) (turned to the R with pillows) Nurse Communication: Mobility status;Other (comment) (Orthostatic BPs, turning protocol recommended and initiated secondary to pt reporting mild sacral pain) PT Visit Diagnosis: Unsteadiness on feet (R26.81);Muscle weakness (generalized) (M62.81);History of falling (Z91.81);Other abnormalities of gait and mobility (R26.89)     Time: [id number] PT Time Calculation (min) (ACUTE ONLY): 29 min  Charges:  $Therapeutic Exercise: 8-22 mins $Therapeutic Activity: 8-22 mins                     D. Scott Kieana Livesay PT, DPT 06/07/20, 4:35 PM

## 2020-06-07 NOTE — Progress Notes (Signed)
Nutrition Follow-up  DOCUMENTATION CODES:   Obesity unspecified  INTERVENTION:  Continue Ensure Enlive po BID, each supplement provides 350 kcal and 20 grams of protein  Continue Rena-vit po daily  Diet Liberalized   NUTRITION DIAGNOSIS:   Increased nutrient needs related to chronic illness (CKD IV with temp HD, CHF) as evidenced by estimated needs. ongoing  GOAL:   Patient will meet greater than or equal to 90% of their needs    MONITOR:   PO intake,Supplement acceptance,Labs,Weight trends,Skin,I & O's  REASON FOR ASSESSMENT:   LOS    ASSESSMENT:  65 y/o female with h/o CKD IV, CHF, DM and [place] who is admitted with AKI and volume overload.  RD working remotely.  Meal intake has improved, eating 95-100% of the last 7 documented meals and accepting approximately 50% of Ensure supplements. Pt is also receiving outside food, noted refusal of Ensure supplement yesterday afternoon after eating [employer] for lunch.   Weights down 35 lbs this admission, Net -25.5 L since admit  HD on 1/24; Net UF 500 ml UOP: 300 ml x 24 hrs  Plans to d/c to SNF, ongoing efforts to find facilities accepting LOG residents.  Medications reviewed and include: D3, Melatonin, Robaxin, Rena-vit, Senokot, Renvela, Demadex, B12  No new labs for review  Diet Order:   Diet Order            Diet 2 gram sodium Room service appropriate? Yes; Fluid consistency: Thin; Fluid restriction: 1200 mL Fluid  Diet effective now                 EDUCATION NEEDS:   Education needs have been addressed  Skin:  Skin Assessment: Reviewed RN Assessment (ecchymosis, Stage II coccyx)  Last BM:  1/24 - type 5 (brown;medium)  Height:   Ht Readings from Last 1 Encounters:  05/16/20 '5\' 6"'$  (1.676 m)    Weight:   Wt Readings from Last 1 Encounters:  06/07/20 75.9 kg    Ideal Body Weight:  59 kg  BMI:  Body mass index is 27 kg/m.  Estimated Nutritional Needs:   Kcal:   1900-2200kcal/day  Protein:  95-110g/day  Fluid:  UOP +1L   Lajuan Lines, RD, LDN Clinical Nutrition After Hours/Weekend Pager # in [place]

## 2020-06-07 NOTE — Progress Notes (Addendum)
PROGRESS NOTE    Katie [name]  [id number] DOB: 1955-09-22 DOA: 04/30/2020 PCP: Patient, [name]    Brief Narrative:  Katie [name] a 65 y.o.female with a history of diabetes mellitus type 2, stage IV CKD, heart failure, hypertension. Patient presented secondary to dyspnea with evidence of volume overload and pleural effusion.  Patient has had an extended hospital course.  Her acute medical issues have essentially resolved at this point.  She is medically stable for discharge however we do not have a safe disposition plan.  Patient does not have stable living and lives with a friend here.  She states that the friend likely will not be able to care for her.  TOC working to find placement however multiple facilities have refused at this point.  Senior case management involved.  Expanding search.  Patient remains stable.  Continues to be very unmotivated always having pain complaints when PT attempted to work with her however then later complains of buttock pain from sitting too much.  Been reinforce the need for exertion and ambulation daily basis.  Pt also very orthostatic and having orthostatics checked daily. I have started her on standing midodrine and place Ted stocking. Need this to be stabilized prior to discharge.   1/19- Had HD today. +orthostatics on PT evaluation today. Pt reports being symptomatic when bp drops 1/20-has no complaints this am. No overnight issues. 1/21-pt seen in HD today. Has no complaints.  1/22-pt had HD yesterday. She was positive for orthostatics, bp supine 122/51 with standing dropped 77/47 and was symptomatic.  1/23 no overnight issues, HD tomorrow. 1/24-dialysis today.  Declined PT today 1/25-no overnight issues. Reports will engage with PT today.BP low today bp meds held this am.   Consultants:   nephrology  Procedures: HD  Antimicrobials:       Subjective: Denies shortness of breath, chest pain.  Has not stood up to see if she  is orthostatics today.  Awaiting PT for evaluation of this  Objective: Vitals:   06/06/20 2051 06/07/20 0114 06/07/20 0452 06/07/20 0524  BP: (!) 155/64 136/73 (!) 154/68   Pulse: 79 67 69   Resp: '16 16 15   '$ Temp: 99.3 F (37.4 C) 98.2 F (36.8 C) 98.5 F (36.9 C)   TempSrc: Oral Oral Oral   SpO2: 97% 97% 95%   Weight:    75.9 kg  Height:        Intake/Output Summary (Last 24 hours) at 06/07/2020 0809 Last data filed at 06/06/2020 2305 Gross per 24 hour  Intake 480 ml  Output 800 ml  Net -320 ml   Filed Weights   06/02/20 0201 06/03/20 0539 06/07/20 0524  Weight: 75.7 kg 75.9 kg 75.9 kg    Examination: Calm, NAD, comfortable CTA no wheeze rales rhonchi's Regular S1-S2 no gallops Soft benign positive bowel sounds No edema Alert oriented x3 grossly intact   Data Reviewed: I have personally reviewed following labs and imaging studies  CBC: Recent Labs  Lab 06/06/20 1224  WBC 6.6  NEUTROABS 4.2  HGB 8.5*  HCT 26.0*  MCV 95.9  PLT 123456   Basic Metabolic Panel: Recent Labs  Lab 06/01/20 1853 06/03/20 1242 06/04/20 0951 06/06/20 1224  NA  --  139  --  139  K  --  3.8  --  3.5  CL  --  102  --  103  CO2  --  29  --  31  GLUCOSE  --  126*  --  144*  BUN  --  19  --  24*  CREATININE  --  1.48*  --  1.40*  CALCIUM  --  7.9*  --  8.0*  PHOS 3.6  --  4.7* 3.2   GFR: Estimated Creatinine Clearance: 43.3 mL/min (A) (by C-G formula based on SCr of 1.4 mg/dL (H)). Liver Function Tests: Recent Labs  Lab 06/06/20 1224  ALBUMIN 2.3*   No results for input(s): LIPASE, AMYLASE in the last 168 hours. No results for input(s): AMMONIA in the last 168 hours. Coagulation Profile: No results for input(s): INR, PROTIME in the last 168 hours. Cardiac Enzymes: No results for input(s): CKTOTAL, CKMB, CKMBINDEX, TROPONINI in the last 168 hours. BNP (last 3 results) No results for input(s): PROBNP in the last 8760 hours. HbA1C: No results for input(s): HGBA1C in the  last 72 hours. CBG: Recent Labs  Lab 05/31/20 0810 06/01/20 2118  GLUCAP 93 116*   Lipid Profile: No results for input(s): CHOL, HDL, LDLCALC, TRIG, CHOLHDL, LDLDIRECT in the last 72 hours. Thyroid Function Tests: No results for input(s): TSH, T4TOTAL, FREET4, T3FREE, THYROIDAB in the last 72 hours. Anemia Panel: No results for input(s): VITAMINB12, FOLATE, FERRITIN, TIBC, IRON, RETICCTPCT in the last 72 hours. Sepsis Labs: No results for input(s): PROCALCITON, LATICACIDVEN in the last 168 hours.  No results found for this or any previous visit (from the past 240 hour(s)).       Radiology Studies: No results found.      Scheduled Meds: . amLODipine  5 mg Oral Daily  . benzonatate  200 mg Oral TID  . carvedilol  12.5 mg Oral BID WC  . Chlorhexidine Gluconate Cloth  6 each Topical Q0600  . cholecalciferol  1,000 Units Oral Daily  . epoetin (EPOGEN/PROCRIT) injection  10,000 Units Intravenous Q M,W,F-HD  . feeding supplement  237 mL Oral BID BM  . heparin  5,000 Units Subcutaneous Q8H  . lidocaine  1 patch Transdermal Q24H  . losartan  75 mg Oral Daily  . melatonin  5 mg Oral QPM  . methocarbamol  500 mg Oral QID  . midodrine  5 mg Oral TID WC  . multivitamin  1 tablet Oral QHS  . polyethylene glycol  17 g Oral Daily  . senna-docusate  1 tablet Oral BID  . sevelamer carbonate  800 mg Oral TID WC  . sodium chloride flush  3 mL Intravenous Q12H  . topiramate  25 mg Oral BID  . torsemide  40 mg Oral Daily  . traZODone  50 mg Oral QPM  . vitamin B-12  100 mcg Oral Daily   Continuous Infusions: . sodium chloride      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)   Hyperkalemia   Pressure injury of skin   Pleural effusion   Acute heart failure with preserved ejection fraction (HFpEF) (HCC)    Chronic kidney disease (CKD), stage IV (severe) (HCC)   Pulmonary hypertension, unspecified (HCC)   Volume overload In setting of CKD. Cardiomegaly on chest x-ray.  Started on Lasix IV on admission but has now progressed to requiring hemodialysis. CKD likely progressed to ESRD 12/25-he is asymptomatic and euvolemic on exam  Continuing on hemodialysis  Had permacath placed to the right chest  Patient has dialysis chair set up when patient  is discharged    Diabetes mellitus, type 2 Hemoglobin A1C of 4.7%.  1/25-BG stable. Diet controlled.    Pleural effusion In setting of fluid overload with new diagnosis of grade 2 diastolic dysfunction  Echo 05/01/20 with nml EF S/p diagnostic Rt thoracentesis performed on 12/20 No evidence of maligancy on cytology     Orthostatic hypotension- becomes symptomatic. SBP standing in 70's- 80's Added midrodrine standing dose, increase as needed Ted stocking  Continue checking daily orthostatics, today pending     ESRD-MWF- Unknown baseline. 2/2 HTN and atherosclerosis Hx/o NSADS Patient is unsure of her primary nephrologist in [place].  Creatinine of 2.97 on admission.  Per patient, she was planned to start hemodialysis in addition to consideration for renal transplant.  Nephrology consulted on 12/20.  Worsening renal function in setting of attempted Lasix diuresis for heart failure. Nephrology now started hemodialysis.  Patient on midodrine 10 mg on dialysis days due to orthostatic hypotension which she becomes very symptomatic Was started on midodrine 5 mg p.o. 3 times daily on 1/22 besides the 10 mg given on dialysis day Expected [place] Pending financial clearance for discharge planning TOC attempting disposition to SNF Nephrology following          Acute diastolic heart failure New diagnosis. Grade 2 dysfunction. Normal EF.  Associated RV systolic dysfunction with elevated PA pressure seen on TTE  although RHC on 12/28 significant for mildly elevated right heart/pulmonary artery pressures.  resolved, remains euvolemic  On torsemide per nephrology's recommendation  Daily weight  I's and O's    Primary hypertension Patient is on at least hydrochlorothiazide as an outpatient but is unsure of her regimen.  Stable, but has orthostatic hypotension, challenging to control Started on midodrine as above Decreases amlodipine to 5 mg daily continue losartan, torsemide, and carvedilol...>held today as bp on low side    Moderate pericardial effusion No clinical evidence of tamponade.  No recent infections.  In setting of CKD V with elevated BUN. No chest pain. -cardiology rec. Outpatient Echo for surveillance Hemodynamics stable , continue to monitor   Mild/moderate aortic stenosis Unsure if this is symptomatic.  Patient does have dyspnea with exertion but this may be multifactorial. -Cardiology recommendations: outpatient Transthoracic Echocardiogram for surveillance    Anemia Possible related to underlying kidney disease.  Evidence of macrocytosis on CBC.  TIBC low but otherwise normal iron panel. Vitamin B12 level 358, target > 400, started oral supplement and folate normal.  Hyperkalemia In setting of CKD. Lokelma 10 g given with improvement.  Pressure injury Mid coccyx, POA  History of tobacco use Patient has quit for at least 10 years. Previously, 20 pack year history.  Headache Normal chronic headache. Patient states she takes Imitrex as needed.  Continue Tylenol and oxycodone as needed 1/8 s/p Fioricet 1 dose 1/9 IV Solu-Medrol 60 mg x 1 dose, Benadryl 25 mg IV x1 dose and Phenergan 25 mg IV x1 dose given 1/10Headache improved,patient slept well it seems insomnia is contributing to headache  Insomnia, started trazodone and melatonin Continue melatonin and trazodone nightly,seems to be helping patient to sleep   Obesity Body mass index is  30.91 kg/m.   DVT prophylaxis: Heparin Code Status: Full Family Communication: None at bedside  Status is: Inpatient  Remains inpatient appropriate because:Unsafe d/c plan   Dispo: The patient is from: Home              Anticipated d/c is to: SNF  Anticipated d/c date is: TBD              Patient currently is medically stable to d/c.  Patient currently medically stable for discharge.  Outpatient hemodialysis chair has been arranged.  SNF is pending and that is a barrier to discharge.  Continue case management administration involved.  Disposition plan pending. Also check daily orthostatics to make sure stable prior to dc.            LOS: 37 days   Time spent: 35 min with >50% on coc    Nolberto Hanlon, MD [facility] Pager [phone]  If 7PM-7AM, please contact night-coverage 06/07/2020, 8:09 AM   PROGRESS NOTE    Katie [name]  [id number] DOB: [birth year] DOA: 04/30/2020 PCP: Patient, [name]    Brief Narrative:  Katie [name] a 65 y.o.female with a history of diabetes mellitus type 2, stage IV CKD, heart failure, hypertension. Patient presented secondary to dyspnea with evidence of volume overload and pleural effusion.  Patient has had an extended hospital course.  Her acute medical issues have essentially resolved at this point.  She is medically stable for discharge however we do not have a safe disposition plan.  Patient does not have stable living and lives with a friend here.  She states that the friend likely will not be able to care for her.  TOC working to find placement however multiple facilities have refused at this point.  Senior case management involved.  Expanding search.  Patient remains stable.  Continues to be very unmotivated always having pain complaints when PT attempted to work with her however then later complains of buttock pain from sitting too much.  I try to reinforce the need for exertion and ambulation daily  basis.   1/19- Had HD today. +orthostatics on PT evaluation today. Pt reports being symptomatic when bp drops  1/20-has no complaints this am. No overnight issues. 1/21-pt seen in HD today. Has no complaints.  1/22-pt had HD yesterday. She was positive for orthostatics, bp supine 122/51 with standing dropped 77/47 and was symptomatic.   Consultants:   nephrology  Procedures: HD  Antimicrobials:       Subjective: Has no dizziness while laying down. No sob or cp  Objective: Vitals:   06/06/20 2051 06/07/20 0114 06/07/20 0452 06/07/20 0524  BP: (!) 155/64 136/73 (!) 154/68   Pulse: 79 67 69   Resp: '16 16 15   '$ Temp: 99.3 F (37.4 C) 98.2 F (36.8 C) 98.5 F (36.9 C)   TempSrc: Oral Oral Oral   SpO2: 97% 97% 95%   Weight:    75.9 kg  Height:        Intake/Output Summary (Last 24 hours) at 06/07/2020 0809 Last data filed at 06/06/2020 2305 Gross per 24 hour  Intake 480 ml  Output 800 ml  Net -320 ml   Filed Weights   06/02/20 0201 06/03/20 0539 06/07/20 0524  Weight: 75.7 kg 75.9 kg 75.9 kg    Examination: Calm, comfortable, NAD CTA, no R/W/R Regular S1-S2 no gallops Soft nontender nondistended positive bowel sounds Trace edema bilateral lower extremity Alert oriented x3, grossly intact Mood and affect appropriate in current setting  Data Reviewed: I have personally reviewed following labs and imaging studies  CBC: Recent Labs  Lab 06/06/20 1224  WBC 6.6  NEUTROABS 4.2  HGB 8.5*  HCT 26.0*  MCV 95.9  PLT 123456   Basic Metabolic Panel: Recent Labs  Lab 06/01/20 1853 06/03/20  1242 06/04/20 0951 06/06/20 1224  NA  --  139  --  139  K  --  3.8  --  3.5  CL  --  102  --  103  CO2  --  29  --  31  GLUCOSE  --  126*  --  144*  BUN  --  19  --  24*  CREATININE  --  1.48*  --  1.40*  CALCIUM  --  7.9*  --  8.0*  PHOS 3.6  --  4.7* 3.2   GFR: Estimated Creatinine Clearance: 43.3 mL/min (A) (by C-G formula based on SCr of 1.4 mg/dL (H)). Liver  Function Tests: Recent Labs  Lab 06/06/20 1224  ALBUMIN 2.3*   No results for input(s): LIPASE, AMYLASE in the last 168 hours. No results for input(s): AMMONIA in the last 168 hours. Coagulation Profile: No results for input(s): INR, PROTIME in the last 168 hours. Cardiac Enzymes: No results for input(s): CKTOTAL, CKMB, CKMBINDEX, TROPONINI in the last 168 hours. BNP (last 3 results) No results for input(s): PROBNP in the last 8760 hours. HbA1C: No results for input(s): HGBA1C in the last 72 hours. CBG: Recent Labs  Lab 05/31/20 0810 06/01/20 2118  GLUCAP 93 116*   Lipid Profile: No results for input(s): CHOL, HDL, LDLCALC, TRIG, CHOLHDL, LDLDIRECT in the last 72 hours. Thyroid Function Tests: No results for input(s): TSH, T4TOTAL, FREET4, T3FREE, THYROIDAB in the last 72 hours. Anemia Panel: No results for input(s): VITAMINB12, FOLATE, FERRITIN, TIBC, IRON, RETICCTPCT in the last 72 hours. Sepsis Labs: No results for input(s): PROCALCITON, LATICACIDVEN in the last 168 hours.  No results found for this or any previous visit (from the past 240 hour(s)).       Radiology Studies: No results found.      Scheduled Meds: . amLODipine  5 mg Oral Daily  . benzonatate  200 mg Oral TID  . carvedilol  12.5 mg Oral BID WC  . Chlorhexidine Gluconate Cloth  6 each Topical Q0600  . cholecalciferol  1,000 Units Oral Daily  . epoetin (EPOGEN/PROCRIT) injection  10,000 Units Intravenous Q M,W,F-HD  . feeding supplement  237 mL Oral BID BM  . heparin  5,000 Units Subcutaneous Q8H  . lidocaine  1 patch Transdermal Q24H  . losartan  75 mg Oral Daily  . melatonin  5 mg Oral QPM  . methocarbamol  500 mg Oral QID  . midodrine  5 mg Oral TID WC  . multivitamin  1 tablet Oral QHS  . polyethylene glycol  17 g Oral Daily  . senna-docusate  1 tablet Oral BID  . sevelamer carbonate  800 mg Oral TID WC  . sodium chloride flush  3 mL Intravenous Q12H  . topiramate  25 mg Oral BID  .  torsemide  40 mg Oral Daily  . traZODone  50 mg Oral QPM  . vitamin B-12  100 mcg Oral Daily   Continuous Infusions: . sodium chloride      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)   Hyperkalemia   Pressure injury of skin   Pleural effusion   Acute heart failure with preserved ejection fraction (HFpEF) (HCC)   Chronic kidney disease (CKD), stage IV (severe) ([place])   Pulmonary hypertension, unspecified (HCC)   Volume  overload In setting of CKD. Cardiomegaly on chest x-ray.  Started on Lasix IV on admission but has now progressed to requiring hemodialysis. CKD likely progressed to ESRD 1/22-euvolemic on exam Getting dialysis Had permacath placed to the right chest Patient has dialysis chair set up 1 patient is discharged      Pleural effusion In setting of fluid overload with new diagnosis of grade 2 diastolic dysfunction  Echo 05/01/2020 with normal EF Status post diagnostic right thoracentesis performed on 12/20 No evidence of malignancy on cytology       Diabetes mellitus, type 2 Hemoglobin A1C of 4.7%.  1/22-BG controlled  Diet controlled      ESRD-MWF- Unknown baseline. 2/2 HTN and atherosclerosis Hx/o NSADS Patient is unsure of her primary nephrologist in [place].  Creatinine of 2.97 on admission.  Per patient, she was planned to start hemodialysis in addition to consideration for renal transplant.  Nephrology consulted on 12/20.  Worsening renal function in setting of attempted Lasix diuresis for heart failure. Nephrology now started hemodialysis. 1/22-had dialysis yesterday.  Does become orthostatics with dialysis and is on midodrine. Will increase midodrine to 5 mg p.o. 3 times daily except for Monday Wednesday Friday dialysis today where she needs to get 10 mg as previous  order Expected [place] Pending financial clearance for discharge planning TOC attempting disposition to SNF Nephrology following         Acute diastolic heart failure New diagnosis. Grade 2 dysfunction. Normal EF.  Associated RV systolic dysfunction with elevated PA pressure seen on TTE although RHC on 12/28 significant for mildly elevated right heart/pulmonary artery pressures.  resolved, remains euvolemic Continue torsemide per nephrology's recommendation Daily weight I's and O's      Moderate pericardial effusion No clinical evidence of tamponade.  No recent infections.  In setting of CKD V with elevated BUN. No chest pain. -cardiology rec. Outpatient Echo for surveillance Hemodynamics stable , continue to monitor   Mild/moderate aortic stenosis Unsure if this is symptomatic.  Patient does have dyspnea with exertion but this may be multifactorial. -Cardiology recommendations: outpatient Transthoracic Echocardiogram for surveillance  Primary hypertension Patient is on at least hydrochlorothiazide as an outpatient but is unsure of her regimen. Improved control -Losartan, carvedilol, amlodipine -Lasix as above  Anemia Possible related to underlying kidney disease.  Evidence of macrocytosis on CBC.  TIBC low but otherwise normal iron panel. Vitamin B12 level 358, target > 400, started oral supplement and folate normal.  Hyperkalemia In setting of CKD. Lokelma 10 g given with improvement.  Pressure injury Mid coccyx, POA  History of tobacco use Patient has quit for at least 10 years. Previously, 20 pack year history.  Headache Normal chronic headache. Patient states she takes Imitrex as needed.  Continue Tylenol and oxycodone as needed 1/8 s/p Fioricet 1 dose 1/9 IV Solu-Medrol 60 mg x 1 dose, Benadryl 25 mg IV x1 dose and Phenergan 25 mg IV x1 dose given 1/10Headache improved,patient slept well it seems insomnia is  contributing to headache  Insomnia, started trazodone and melatonin Continue melatonin and trazodone nightly,seems to be helping patient to sleep   Obesity Body mass index is 30.91 kg/m.   DVT prophylaxis: Heparin Code Status: Full Family Communication: None at bedside  Status is: Inpatient  Remains inpatient appropriate because:Unsafe d/c plan   Dispo: The patient is from: Home              Anticipated d/c is to: SNF  Anticipated d/c date is: > 3 days              Patient currently is medically stable to d/c.  Patient currently medically stable for discharge.  Outpatient hemodialysis chair has been arranged.  SNF is pending and that is a barrier to discharge.  Continue case management administration involved.  Disposition plan pending.            LOS: 37 days   Time spent: 35 min with >50% on coc    Nolberto Hanlon, MD [facility] Pager [phone]  If 7PM-7AM, please contact night-coverage 06/07/2020, 8:09 AM

## 2020-06-07 NOTE — TOC Progression Note (Signed)
Transition of Care [facility]) - Progression Note    Patient Details  Name: Katie [name] MRN: [id number] Date of Birth: 1956/03/02  Transition of [facility]) CM/SW Scott, LCSW Phone Number: 06/07/2020, 9:44 AM  Clinical Narrative:   Call to [facility], inquired if they would take a LOG. She reported they are not at this time due to already having [facility] LOG residents. She agreed to ask her Administrator if/when this will change and will let CSW know.   Expected Discharge Plan: Skilled Nursing Facility Barriers to Discharge: Continued Medical Work up,Homeless with medical needs  Expected Discharge Plan and Services Expected Discharge Plan: [place] In-house Referral: Clinical Social Work Discharge Planning Services: Homebound not met per provider Post Acute Care Choice: [place] Living arrangements for the past 2 months: No permanent address (Lived with Sister and family)                 DME Arranged: N/A DME Agency: NA       HH Arranged: NA HH Agency: NA         Social Determinants of Health (SDOH) Interventions    Readmission Risk Interventions No flowsheet data found.

## 2020-06-07 NOTE — Progress Notes (Signed)
[facility]  ROUNDING NOTE   Subjective:   Hemodialysis treatment yesterday. Tolerated treatment well. UF of 57m.   Objective:  Vital signs in last 24 hours:  Temp:  [98.1 F (36.7 C)-99.3 F (37.4 C)] 98.7 F (37.1 C) (01/25 0921) Pulse Rate:  [66-79] 72 (01/25 0921) Resp:  [12-17] 16 (01/25 0921) BP: (112-155)/(53-74) 112/53 (01/25 0921) SpO2:  [95 %-97 %] 95 % (01/25 0921) Weight:  [75.9 kg] 75.9 kg (01/25 0524)  Weight change:  Filed Weights   06/02/20 0201 06/03/20 0539 06/07/20 0524  Weight: 75.7 kg 75.9 kg 75.9 kg    Intake/Output: I/O last 3 completed shifts: In: 480 [P.O.:480] Out: 1400 [Urine:900; Other:500]   Intake/Output this shift:  No intake/output data recorded.  Physical Exam: General:  Laying in bed, no acute distress  HEENT  normocephalic, atraumatic  Pulm/lungs  clear bilateral  CVS/Heart  regular  Abdomen:   Soft, nontender, nondistended  Extremities:  No lower extremity edema  Neurologic:  AAOx4  Skin:  No acute rashes, or lesions   Access: RIJ permcath 05/16/20 Dr. DLucky Cowboy   Basic Metabolic Panel: Recent Labs  Lab 06/01/20 1853 06/03/20 1242 06/04/20 0951 06/06/20 1224  NA  --  139  --  139  K  --  3.8  --  3.5  CL  --  102  --  103  CO2  --  29  --  31  GLUCOSE  --  126*  --  144*  BUN  --  19  --  24*  CREATININE  --  1.48*  --  1.40*  CALCIUM  --  7.9*  --  8.0*  PHOS 3.6  --  4.7* 3.2    Liver Function Tests: Recent Labs  Lab 06/06/20 1224  ALBUMIN 2.3*   No results for input(s): LIPASE, AMYLASE in the last 168 hours. No results for input(s): AMMONIA in the last 168 hours.  CBC: Recent Labs  Lab 06/06/20 1224  WBC 6.6  NEUTROABS 4.2  HGB 8.5*  HCT 26.0*  MCV 95.9  PLT 253    Cardiac Enzymes: No results for input(s): CKTOTAL, CKMB, CKMBINDEX, TROPONINI in the last 168 hours.  BNP: Invalid input(s): POCBNP  CBG: Recent Labs  Lab 06/01/20 2118  GLUCAP 116*    Microbiology: Results for  orders placed or performed during the hospital encounter of 04/30/20  Resp Panel by RT-PCR (Flu A&B, Covid) Nasopharyngeal Swab     Status: None   Collection Time: 04/30/20  4:01 PM   Specimen: Nasopharyngeal Swab; Nasopharyngeal(NP) swabs in vial transport medium  Result Value Ref Range Status   SARS Coronavirus 2 by RT PCR NEGATIVE NEGATIVE Final    Comment: (NOTE) SARS-CoV-2 target nucleic acids are NOT DETECTED.  The SARS-CoV-2 RNA is generally detectable in upper respiratory specimens during the acute phase of infection. The lowest concentration of SARS-CoV-2 viral copies this assay can detect is 138 copies/mL. A negative result does not preclude SARS-Cov-2 infection and should not be used as the sole basis for treatment or other patient management decisions. A negative result may occur with  improper specimen collection/handling, submission of specimen other than nasopharyngeal swab, presence of viral mutation(s) within the areas targeted by this assay, and inadequate number of viral copies(<138 copies/mL). A negative result must be combined with clinical observations, patient history, and epidemiological information. The expected result is Negative.  Fact Sheet for Patients:  hEntrepreneurPulse.com.au Fact Sheet for Healthcare Providers:  hIncredibleEmployment.be This test  is no t yet approved or cleared by the [place] and  has been authorized for detection and/or diagnosis of SARS-CoV-2 by FDA under an Emergency Use Authorization (EUA). This EUA will remain  in effect (meaning this test can be used) for the duration of the COVID-19 declaration under Section 564(b)(1) of the Act, 21 U.S.C.section 360bbb-3(b)(1), unless the authorization is terminated  or revoked sooner.       Influenza A by PCR NEGATIVE NEGATIVE Final   Influenza B by PCR NEGATIVE NEGATIVE Final    Comment: (NOTE) The Xpert Xpress SARS-CoV-2/FLU/RSV plus  assay is intended as an aid in the diagnosis of influenza from Nasopharyngeal swab specimens and should not be used as a sole basis for treatment. Nasal washings and aspirates are unacceptable for Xpert Xpress SARS-CoV-2/FLU/RSV testing.  Fact Sheet for Patients: EntrepreneurPulse.com.au  Fact Sheet for Healthcare Providers: IncredibleEmployment.be  This test is not yet approved or cleared by the [place] FDA and has been authorized for detection and/or diagnosis of SARS-CoV-2 by FDA under an Emergency Use Authorization (EUA). This EUA will remain in effect (meaning this test can be used) for the duration of the COVID-19 declaration under Section 564(b)(1) of the Act, 21 U.S.C. section 360bbb-3(b)(1), unless the authorization is terminated or revoked.  Performed at [facility], [address] [place], [place] [address]   Body fluid culture     Status: None   Collection Time: 05/02/20  4:44 PM   Specimen: PATH Cytology Pleural fluid  Result Value Ref Range Status   Specimen Description   Final    PLEURAL Performed at [facility], [address], [place], [place] [address]    Special Requests   Final    NONE Performed at [facility], [address] [place], [place] [address]    Gram Stain   Final    MODERATE WBC PRESENT, PREDOMINANTLY MONONUCLEAR NO ORGANISMS SEEN    Culture   Final    NO GROWTH Performed at [facility], [place] [address], [place], [place] [address]    Report Status 05/06/2020 FINAL  Final  MRSA PCR Screening     Status: None   Collection Time: 05/16/20  9:29 PM   Specimen: Nasal Mucosa; Nasopharyngeal  Result Value Ref Range Status   MRSA by PCR NEGATIVE NEGATIVE Final    Comment:        The GeneXpert MRSA Assay (FDA approved for NASAL specimens only), is one component of a comprehensive MRSA colonization surveillance program. It is not intended to diagnose  MRSA infection nor to guide or monitor treatment for MRSA infections. Performed at [facility], [address] [place],  [address]     Coagulation Studies: No results for input(s): LABPROT, INR in the last 72 hours.  Urinalysis: No results for input(s): COLORURINE, LABSPEC, PHURINE, GLUCOSEU, HGBUR, BILIRUBINUR, KETONESUR, PROTEINUR, UROBILINOGEN, NITRITE, LEUKOCYTESUR in the last 72 hours.  Invalid input(s): APPERANCEUR    Imaging: No results found.   Medications:   . sodium chloride     . amLODipine  5 mg Oral Daily  . benzonatate  200 mg Oral TID  . carvedilol  12.5 mg Oral BID WC  . Chlorhexidine Gluconate Cloth  6 each Topical Q0600  . cholecalciferol  1,000 Units Oral Daily  . epoetin (EPOGEN/PROCRIT) injection  10,000 Units Intravenous Q M,W,F-HD  . feeding supplement  237 mL Oral BID BM  . heparin  5,000 Units Subcutaneous Q8H  . lidocaine  1 patch Transdermal Q24H  . losartan  75 mg Oral Daily  . melatonin  5 mg Oral QPM  . methocarbamol  500 mg Oral QID  . midodrine  5 mg Oral TID WC  . multivitamin  1 tablet Oral QHS  . polyethylene glycol  17 g Oral Daily  . senna-docusate  1 tablet Oral BID  . sevelamer carbonate  800 mg Oral TID WC  . sodium chloride flush  3 mL Intravenous Q12H  . topiramate  25 mg Oral BID  . torsemide  40 mg Oral Daily  . traZODone  50 mg Oral QPM  . vitamin B-12  100 mcg Oral Daily   sodium chloride, acetaminophen **OR** acetaminophen, ALPRAZolam, bisacodyl, chlorpheniramine-HYDROcodone, ondansetron **OR** ondansetron (ZOFRAN) IV, oxyCODONE-acetaminophen, promethazine, sodium chloride flush, SUMAtriptan  Assessment/ Plan:  [name] is a 65 y.o. white female with diastolic congestive heart failure, hypertension, diabetes mellitus type II who is admitted to [facility] on 04/30/2020 for Chronic kidney disease (CKD), stage IV (severe) ([place]) [N18.4] Volume overload [E87.70]  Patient initiated on hemodialysis this  admission.   1. End stage renal disease  - Continue MWF schedule. Outpatient planning in process  2. Hypertension with lower extremity edema:   - Current regimen of losartan, carvedilol, amlodipine and torsemide.  - discontinue midodrine and amlodipine.   3. Anemia of chronic kidney disease:  Lab Results  Component Value Date   HGB 8.5 (L) 06/06/2020  EPO with HD  4. Secondary hyperparathyroidism Lab Results  Component Value Date   PTH 70 (H) 05/12/2020   CALCIUM 8.0 (L) 06/06/2020   PHOS 3.2 06/06/2020  - sevelamer with meals    LOS: 37 Christon Gallaway 1/25/202210:04 AM

## 2020-06-08 DIAGNOSIS — N170 Acute kidney failure with tubular necrosis: Secondary | ICD-10-CM

## 2020-06-08 NOTE — Plan of Care (Signed)
Patient currently not able to ambulate without assistance Problem: Activity: Goal: Capacity to carry out activities will improve Outcome: Not Progressing

## 2020-06-08 NOTE — Progress Notes (Signed)
PROGRESS NOTE    Katie [name]  [id number] DOB: 1956/03/18 DOA: 04/30/2020 PCP: Patient, [name]   Brief Narrative: Taken from prior notes. Jayelle Cavendish a 65 y.o.female with a history of diabetes mellitus type 2, stage IV CKD, heart failure, hypertension. Patient presented secondary to dyspnea with evidence of volume overload and pleural effusion.  Patient has had an extended hospital course. Her acute medical issues have essentially resolved at this point. She is medically stable for discharge however we do not have a safe disposition plan. Patient does not have stable living and lives with a friend here. She states that the friend likely will not be able to care for her.  TOC working to find placement however multiple facilities have refused at this point. Senior case management involved. Expanding search.  Patient remains stable.Continues to be very unmotivated always having pain complaints when PT attempted to work with her however then later complains of buttock pain from sitting too much. Been reinforce the need for exertion and ambulation daily basis.  Pt also very orthostatic and having orthostatics checked daily.remained positive for orthostasis despite starting on midodrine and TED hose, will try abdominal binder, most likely autonomic dysfunction.   Subjective: Patient was complaining of some headache when seen today.  No associated symptoms.  She was lying in bed with head covered.  Able to communicate but she wants to rest due to headache.  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)   Hyperkalemia   Pressure injury of skin   Pleural effusion   Acute heart failure with preserved ejection fraction (HFpEF) (HCC)   Chronic kidney disease (CKD), stage IV (severe) ([place])   Pulmonary  hypertension, unspecified ([place])  HFpEF.  Echo done in December 2021 with normal EF and grade 2 diastolic dysfunction.Associated RV systolic dysfunction with elevated PA pressure seen on TTE although RHC on 12/28 significant for mildly elevated right heart/pulmonary artery pressures.    She was initially admitted with volume overload.  Initially managed with IV Lasix, progress to ESRD requiring dialysis, currently volume is being managed with dialysis.  Appears euvolemic. Difficult disposition due to unsafe discharge, apparently she was like living with a friend who cannot take care of her anymore.  TOC is working for places accepting facility. -Continue with dialysis. -Continue with torsemide.  Pleural effusion In setting of fluid overload with new diagnosis of grade 2 diastolic dysfunction  Echo 05/01/20 with nml EF S/p diagnostic Rt thoracentesis performed on 12/20 No evidence of maligancy on cytology  Diet controlled diabetes mellitus, type II.  CBG well controlled and A1c of 4.7 which is against the diagnosis of diabetes. -Currently diet controlled -Continue to monitor.  Orthostatic hypotension.  Patient remained significantly orthostatic positive, most likely autonomic dysfunction.  Baseline blood pressure mildly elevated today. -Continue with midodrine -Continue with TED hose -Can try abdominal binder -Continue to monitor  ESRD-MWF- Unknown baseline. 2/2 HTN and atherosclerosis Hx/o NSADS Patient is unsure of her primary nephrologist in [place].  Creatinine of 2.97 on admission.  Per patient, she was planned to start hemodialysis in addition to consideration for renal transplant.  Nephrology consulted on 12/20.  Worsening renal function in setting of attempted Lasix diuresis for heart failure.  She was started on dialysis by nephrology. Patient on midodrine 10 mg on dialysis  days due to orthostatic hypotension which she becomes very symptomatic Midodrine 5 mg p.o. 3 times  daily started on on 1/22 besides the 10 mg given on dialysis day Expected [facility]ending financial clearance for discharge planning TOC attempting disposition to SNF Nephrology following.  Moderate pericardial effusion No clinical evidence of tamponade.  No recent infections.  In setting of CKD V with elevated BUN. No chest pain. -cardiology rec. Outpatient Echo for surveillance Hemodynamics stable , continue to monitor  Mild/moderate aortic stenosis Unsure if this is symptomatic.  Patient does have dyspnea with exertion but this may be multifactorial. -Cardiology recommendations: outpatient Transthoracic Echocardiogram for surveillance.  Essential hypertension.  Blood pressure mildly elevated but she has significant drop with change in position.  On multiple meds which include carvedilol, losartan, torsemide along with midodrine to see if that will help with her positive orthostatic vitals. Patient needs midodrine during dialysis. -Continue current management and monitor  Anemia Possible related to underlying kidney disease.  Evidence of macrocytosis on CBC.  TIBC low but otherwise normal iron panel. Vitamin B12 level 358, target > 400, started oral supplement and folate normal.  Pressure injury Mid coccyx, POA  History of tobacco use Patient has quit for at least 10 years. Previously, 20 pack year history.  Headache Normal chronic headache. Patient states she takes Imitrex as needed.  Continue Tylenol and oxycodone as needed 1/8 s/p Fioricet 1 dose 1/9 IV Solu-Medrol 60 mg x 1 dose, Benadryl 25 mg IV x1 dose and Phenergan 25 mg IV x1 dose given  Insomnia, started trazodone and melatonin Continue melatonin and trazodone nightly,seems to be helping patient to sleep  Obesity Body mass index is 30.91 kg/m.  Objective: Vitals:   06/08/20 0035 06/08/20 0527 06/08/20 0815 06/08/20 1135  BP: (!) 146/70 (!) 164/74 (!) 143/69 (!) 161/71  Pulse: 73 72  68 68  Resp: '16 16 17 18  '$ Temp: 98 F (36.7 C) (!) 97.5 F (36.4 C) 98.2 F (36.8 C) 98.6 F (37 C)  TempSrc: Oral Oral  Oral  SpO2: 97% 97% 98% 99%  Weight:  77.7 kg    Height:        Intake/Output Summary (Last 24 hours) at 06/08/2020 1257 Last data filed at 06/07/2020 2007 Gross per 24 hour  Intake --  Output 300 ml  Net -300 ml   Filed Weights   06/03/20 0539 06/07/20 0524 06/08/20 0527  Weight: 75.9 kg 75.9 kg 77.7 kg    Examination:  General exam: Appears calm and comfortable  Respiratory system: Clear to auscultation. Respiratory effort normal. Cardiovascular system: S1 & S2 heard, RRR.  Gastrointestinal system: Soft, nontender, nondistended, bowel sounds positive. Central nervous system: Alert and oriented. No focal neurological deficits. Extremities: No edema, no cyanosis, pulses intact and symmetrical. Psychiatry: Judgement and insight appear normal.     DVT prophylaxis: Heparin Code Status: Full Family Communication: Discussed with patient Disposition Plan:  Status is: Inpatient  Remains inpatient appropriate because:Unsafe d/c plan   Dispo: The patient is from: Home              Anticipated d/c is to: SNF              Anticipated d/c date is: 1 day              Patient currently is medically stable to d/c.   Difficult to place patient Yes              Level of  care: Med-Surg  Consultants:   Nephrology  Procedures:  Antimicrobials:   Data Reviewed: I have personally reviewed following labs and imaging studies  CBC: Recent Labs  Lab 06/06/20 1224  WBC 6.6  NEUTROABS 4.2  HGB 8.5*  HCT 26.0*  MCV 95.9  PLT [id number]   Basic Metabolic Panel: Recent Labs  Lab 06/01/20 1853 06/03/20 1242 06/04/20 0951 06/06/20 1224  NA  --  139  --  139  K  --  3.8  --  3.5  CL  --  102  --  103  CO2  --  29  --  31  GLUCOSE  --  126*  --  144*  BUN  --  19  --  24*  CREATININE  --  1.48*  --  1.40*  CALCIUM  --  7.9*  --  8.0*  PHOS 3.6  --  4.7*  3.2   GFR: Estimated Creatinine Clearance: 43.9 mL/min (A) (by C-G formula based on SCr of 1.4 mg/dL (H)). Liver Function Tests: Recent Labs  Lab 06/06/20 1224  ALBUMIN 2.3*   No results for input(s): LIPASE, AMYLASE in the last 168 hours. No results for input(s): AMMONIA in the last 168 hours. Coagulation Profile: No results for input(s): INR, PROTIME in the last 168 hours. Cardiac Enzymes: No results for input(s): CKTOTAL, CKMB, CKMBINDEX, TROPONINI in the last 168 hours. BNP (last 3 results) No results for input(s): PROBNP in the last 8760 hours. HbA1C: No results for input(s): HGBA1C in the last 72 hours. CBG: Recent Labs  Lab 06/01/20 2118  GLUCAP 116*   Lipid Profile: No results for input(s): CHOL, HDL, LDLCALC, TRIG, CHOLHDL, LDLDIRECT in the last 72 hours. Thyroid Function Tests: No results for input(s): TSH, T4TOTAL, FREET4, T3FREE, THYROIDAB in the last 72 hours. Anemia Panel: No results for input(s): VITAMINB12, FOLATE, FERRITIN, TIBC, IRON, RETICCTPCT in the last 72 hours. Sepsis Labs: No results for input(s): PROCALCITON, LATICACIDVEN in the last 168 hours.  No results found for this or any previous visit (from the past 240 hour(s)).   Radiology Studies: No results found.  Scheduled Meds: . benzonatate  200 mg Oral TID  . carvedilol  12.5 mg Oral BID WC  . Chlorhexidine Gluconate Cloth  6 each Topical Q0600  . cholecalciferol  1,000 Units Oral Daily  . epoetin (EPOGEN/PROCRIT) injection  10,000 Units Intravenous Q M,W,F-HD  . feeding supplement  237 mL Oral BID BM  . heparin  5,000 Units Subcutaneous Q8H  . lidocaine  1 patch Transdermal Q24H  . losartan  75 mg Oral Daily  . melatonin  5 mg Oral QPM  . methocarbamol  500 mg Oral QID  . midodrine  5 mg Oral TID WC  . multivitamin  1 tablet Oral QHS  . polyethylene glycol  17 g Oral Daily  . senna-docusate  1 tablet Oral BID  . sevelamer carbonate  800 mg Oral TID WC  . sodium chloride flush  3 mL  Intravenous Q12H  . topiramate  25 mg Oral BID  . torsemide  40 mg Oral Daily  . traZODone  50 mg Oral QPM  . vitamin B-12  100 mcg Oral Daily   Continuous Infusions: . sodium chloride       LOS: 38 days   Time spent: 35 minutes.  Lorella Nimrod, MD [facility]  If 7PM-7AM, please contact night-coverage [email or web address]  06/08/2020, 12:57 PM   This record has been created using Systems analyst. Errors have  been sought and corrected,but may not always be located. Such creation errors do not reflect on the standard of care.

## 2020-06-08 NOTE — Progress Notes (Signed)
[facility]  ROUNDING NOTE   Subjective:   Feeling weak.   Objective:  Vital signs in last 24 hours:  Temp:  [97.5 F (36.4 C)-98.6 F (37 C)] 98.4 F (36.9 C) (01/26 1300) Pulse Rate:  [68-73] 68 (01/26 1300) Resp:  [16-18] 16 (01/26 1300) BP: (143-165)/(69-74) 165/70 (01/26 1300) SpO2:  [96 %-99 %] 98 % (01/26 1300) Weight:  [77.7 kg] 77.7 kg (01/26 0527)  Weight change: 1.813 kg Filed Weights   06/03/20 0539 06/07/20 0524 06/08/20 0527  Weight: 75.9 kg 75.9 kg 77.7 kg    Intake/Output: I/O last 3 completed shifts: In: -  Out: 600 [Urine:600]   Intake/Output this shift:  No intake/output data recorded.  Physical Exam: General:  Laying in bed, no acute distress  HEENT  normocephalic, atraumatic  Pulm/lungs  clear bilateral  CVS/Heart  regular  Abdomen:   Soft, nontender, nondistended  Extremities:  No lower extremity edema  Neurologic:  AAOx4  Skin:  No acute rashes, or lesions   Access: RIJ permcath 05/16/20 Dr. Lucky Cowboy    Basic Metabolic Panel: Recent Labs  Lab 06/01/20 1853 06/03/20 1242 06/04/20 0951 06/06/20 1224  NA  --  139  --  139  K  --  3.8  --  3.5  CL  --  102  --  103  CO2  --  29  --  31  GLUCOSE  --  126*  --  144*  BUN  --  19  --  24*  CREATININE  --  1.48*  --  1.40*  CALCIUM  --  7.9*  --  8.0*  PHOS 3.6  --  4.7* 3.2    Liver Function Tests: Recent Labs  Lab 06/06/20 1224  ALBUMIN 2.3*   No results for input(s): LIPASE, AMYLASE in the last 168 hours. No results for input(s): AMMONIA in the last 168 hours.  CBC: Recent Labs  Lab 06/06/20 1224  WBC 6.6  NEUTROABS 4.2  HGB 8.5*  HCT 26.0*  MCV 95.9  PLT 253    Cardiac Enzymes: No results for input(s): CKTOTAL, CKMB, CKMBINDEX, TROPONINI in the last 168 hours.  BNP: Invalid input(s): POCBNP  CBG: Recent Labs  Lab 06/01/20 2118  GLUCAP 116*    Microbiology: Results for orders placed or performed during the hospital encounter of 04/30/20  Resp  Panel by RT-PCR (Flu A&B, Covid) Nasopharyngeal Swab     Status: None   Collection Time: 04/30/20  4:01 PM   Specimen: Nasopharyngeal Swab; Nasopharyngeal(NP) swabs in vial transport medium  Result Value Ref Range Status   SARS Coronavirus 2 by RT PCR NEGATIVE NEGATIVE Final    Comment: (NOTE) SARS-CoV-2 target nucleic acids are NOT DETECTED.  The SARS-CoV-2 RNA is generally detectable in upper respiratory specimens during the acute phase of infection. The lowest concentration of SARS-CoV-2 viral copies this assay can detect is 138 copies/mL. A negative result does not preclude SARS-Cov-2 infection and should not be used as the sole basis for treatment or other patient management decisions. A negative result may occur with  improper specimen collection/handling, submission of specimen other than nasopharyngeal swab, presence of viral mutation(s) within the areas targeted by this assay, and inadequate number of viral copies(<138 copies/mL). A negative result must be combined with clinical observations, patient history, and epidemiological information. The expected result is Negative.  Fact Sheet for Patients:  EntrepreneurPulse.com.au  Fact Sheet for Healthcare Providers:  IncredibleEmployment.be  This test is no t yet approved or cleared  by the [place] and  has been authorized for detection and/or diagnosis of SARS-CoV-2 by FDA under an Emergency Use Authorization (EUA). This EUA will remain  in effect (meaning this test can be used) for the duration of the COVID-19 declaration under Section 564(b)(1) of the Act, 21 U.S.C.section 360bbb-3(b)(1), unless the authorization is terminated  or revoked sooner.       Influenza A by PCR NEGATIVE NEGATIVE Final   Influenza B by PCR NEGATIVE NEGATIVE Final    Comment: (NOTE) The Xpert Xpress SARS-CoV-2/FLU/RSV plus assay is intended as an aid in the diagnosis of influenza from Nasopharyngeal  swab specimens and should not be used as a sole basis for treatment. Nasal washings and aspirates are unacceptable for Xpert Xpress SARS-CoV-2/FLU/RSV testing.  Fact Sheet for Patients: EntrepreneurPulse.com.au  Fact Sheet for Healthcare Providers: IncredibleEmployment.be  This test is not yet approved or cleared by the [place] FDA and has been authorized for detection and/or diagnosis of SARS-CoV-2 by FDA under an Emergency Use Authorization (EUA). This EUA will remain in effect (meaning this test can be used) for the duration of the COVID-19 declaration under Section 564(b)(1) of the Act, 21 U.S.C. section 360bbb-3(b)(1), unless the authorization is terminated or revoked.  Performed at [facility], [address] [place], [place] [address]   Body fluid culture     Status: None   Collection Time: 05/02/20  4:44 PM   Specimen: PATH Cytology Pleural fluid  Result Value Ref Range Status   Specimen Description   Final    PLEURAL Performed at [facility], [address] [place], [place] [address]    Special Requests   Final    NONE Performed at [facility], [address] [place], [place] [address]    Gram Stain   Final    MODERATE WBC PRESENT, PREDOMINANTLY MONONUCLEAR NO ORGANISMS SEEN    Culture   Final    NO GROWTH Performed at [facility], [place] [address] [place], [place] [address]    Report Status 05/06/2020 FINAL  Final  MRSA PCR Screening     Status: None   Collection Time: 05/16/20  9:29 PM   Specimen: Nasal Mucosa; Nasopharyngeal  Result Value Ref Range Status   MRSA by PCR NEGATIVE NEGATIVE Final    Comment:        The GeneXpert MRSA Assay (FDA approved for NASAL specimens only), is one component of a comprehensive MRSA colonization surveillance program. It is not intended to diagnose MRSA infection nor to guide or monitor treatment for MRSA infections. Performed at  [facility], [address] [place],  [address]     Coagulation Studies: No results for input(s): LABPROT, INR in the last 72 hours.  Urinalysis: No results for input(s): COLORURINE, LABSPEC, PHURINE, GLUCOSEU, HGBUR, BILIRUBINUR, KETONESUR, PROTEINUR, UROBILINOGEN, NITRITE, LEUKOCYTESUR in the last 72 hours.  Invalid input(s): APPERANCEUR    Imaging: No results found.   Medications:   . sodium chloride     . benzonatate  200 mg Oral TID  . carvedilol  12.5 mg Oral BID WC  . Chlorhexidine Gluconate Cloth  6 each Topical Q0600  . cholecalciferol  1,000 Units Oral Daily  . epoetin (EPOGEN/PROCRIT) injection  10,000 Units Intravenous Q M,W,F-HD  . feeding supplement  237 mL Oral BID BM  . heparin  5,000 Units Subcutaneous Q8H  . lidocaine  1 patch Transdermal Q24H  . losartan  75 mg Oral Daily  .  melatonin  5 mg Oral QPM  . methocarbamol  500 mg Oral QID  . midodrine  5 mg Oral TID WC  . multivitamin  1 tablet Oral QHS  . polyethylene glycol  17 g Oral Daily  . senna-docusate  1 tablet Oral BID  . sevelamer carbonate  800 mg Oral TID WC  . sodium chloride flush  3 mL Intravenous Q12H  . topiramate  25 mg Oral BID  . torsemide  40 mg Oral Daily  . traZODone  50 mg Oral QPM  . vitamin B-12  100 mcg Oral Daily   sodium chloride, acetaminophen **OR** acetaminophen, ALPRAZolam, bisacodyl, chlorpheniramine-HYDROcodone, ondansetron **OR** ondansetron (ZOFRAN) IV, oxyCODONE-acetaminophen, promethazine, sodium chloride flush, SUMAtriptan  Assessment/ Plan:  [name] is a 65 y.o. white female with diastolic congestive heart failure, hypertension, diabetes mellitus type II who is admitted to [facility] on 04/30/2020 for Chronic kidney disease (CKD), stage IV (severe) ([place]) [N18.4] Volume overload [E87.70]  Patient initiated on hemodialysis this admission.   1. End stage renal disease  - Change to twice a week dialysis, Mondays and Fridays.   2.  Hypertension with lower extremity edema:  With autonomic orthostatics. Now off amlodipine and midodrine.  - Current regimen of losartan, carvedilol, and torsemide.  - Will continue to monitor.   3. Anemia of chronic kidney disease:  Lab Results  Component Value Date   HGB 8.5 (L) 06/06/2020  EPO with HD  4. Secondary hyperparathyroidism Lab Results  Component Value Date   PTH 70 (H) 05/12/2020   CALCIUM 8.0 (L) 06/06/2020   PHOS 3.2 06/06/2020  - sevelamer with meals    LOS: [place] 1/26/20223:10 PM

## 2020-06-08 NOTE — Evaluation (Signed)
Occupational Therapy Evaluation Patient Details Name: Katie [name] MRN: [id number] DOB: Mar 03, 1956 Today's Date: 06/08/2020    History of Present Illness Patient is a 65 y.o. female with a history of diabetes mellitus type 2, stage IV CKD, heart failure, hypertension. Patient presented secondary to dyspnea with evidence of volume overload and pleural effusion. Diagnosed with moderate pericardial effusion, mild to moderate aortic insufficiency and aortic stenosis.  S/p R heart cath 12/28.  R temp fem cath placed 12/29; 1/3 R IJ permcath placed; 1/4 R temp fem cath removed.   Clinical Impression   Pt seen for OT evaluation this date. Pt was agreeable to session following second attempt this date. Prior to admission pt reportings requiring assistance for LB dressing. Pt does not drive and does not work. Pt reports interest in reading, but unable to do so due to current visual impairments and lack of appropriate AE for reading. This session, pt required MAX A for toilet hygiene and SUPERVISION/SET-UP for seated ADLs. Of note, pt expressed interested in participating in bathing/washing hair next session. OOB mobility deferred d/t orthostatic hypotension remaining unresolved following LE therex and seated ADLs; Orthostatics: supine: 141/66, sitting: 101/68, sitting following ther-ex and seated ADLs: 100/62, return to supine: 123/68. Pt would benefit from continued OT services to improve safety awareness, strength, and activity tolerance, and prevent further deconditioning.   Upon discharge, recommend SNF & supervision with OOB mobility.    Follow Up Recommendations  SNF;Supervision - Intermittent    Equipment Recommendations  3 in 1 bedside commode       Precautions / Restrictions Precautions Precautions: Fall Precaution Comments: R IJ permcath Restrictions Weight Bearing Restrictions: No Other Position/Activity Restrictions: Pt Orthostatic, monitor BP with change of position      Mobility  Bed Mobility Overal bed mobility: Modified Independent Bed Mobility: Supine to Sit;Sit to Supine;Rolling Rolling: Modified independent (Device/Increase time)   Supine to sit: Supervision;HOB elevated Sit to supine: Supervision;HOB elevated   General bed mobility comments: Extra time and effort only    Transfers                 General transfer comment: OOB mobility deferred in s/o orthostatic hypotensions unresolved following LE ther-ex and seated ADLs    Balance Overall balance assessment: Needs assistance Sitting-balance support: No upper extremity supported;Feet supported Sitting balance-Leahy Scale: Good Sitting balance - Comments: no LOB sitting EOB while performing ADLs with bilateral UE                                   ADL either performed or assessed with clinical judgement   ADL Overall ADL's : Needs assistance/impaired     Grooming: Brushing hair;Sitting;Supervision/safety                       Toileting- Clothing Manipulation and Hygiene: Maximal assistance;Bed level Toileting - Clothing Manipulation Details (indicate cue type and reason): pt deferred sit>stand toilet hygiene d/t anxiety regarding orthostatic hypotension when standing. Pt unable to reach backside while sidelying, with pt stating "I usually have back spasms when I twist my back to reach back"       General ADL Comments: Anticipate pt supervision/set-up for seated ADLs. Of note, pt expressed interest in using warm shower cap and bathing next session. Anticipate pt requiring MOD-MAX A for LB dressing/bathing d/t pt needing assistance with LB dressing at baseline     Vision Baseline Vision/History:  Wears glasses;Retinopathy Wears Glasses: At all times Patient Visual Report: No change from baseline Additional Comments: Pt reports that she does not have her everyday glasses with her at the hospital. Pt reports that she has reading glasses, but the prescription is not  strong enough            Pertinent Vitals/Pain Pain Assessment: 0-10 Pain Score: 6  Pain Location: Headache Pain Descriptors / Indicators: Headache Pain Intervention(s): Monitored during session        Extremity/Trunk Assessment Upper Extremity Assessment Upper Extremity Assessment: Generalized weakness   Lower Extremity Assessment Lower Extremity Assessment: Defer to PT evaluation       Communication Communication Communication: No difficulties   Cognition Arousal/Alertness: Awake/alert Behavior During Therapy: WFL for tasks assessed/performed Overall Cognitive Status: Within Functional Limits for tasks assessed                                 General Comments: Pt is A and O x 4. cooperative and pleasant throughout   General Comments  Orthostatics: supine: 141/66, sitting: 101/68, sitting following ther-ex and seated ADLs: 100/62, return to supine: 123/68    Exercises General Exercises - Lower Extremity Ankle Circles/Pumps: AROM;Strengthening;Both;Seated;10 reps Hip Flexion/Marching: AROM;Strengthening;Both;Seated;20 reps        Home Living Family/patient expects to be discharged to:: Private residence Living Arrangements: Other (Comment) (friend) Available Help at Discharge: Friend(s);Available PRN/intermittently Type of Home: House Home Access: Stairs to enter [employer] of Steps: 3 Entrance Stairs-Rails: Can reach both Home Layout: One level         Bathroom Toilet: Standard     Home Equipment: Walker - 4 wheels;Shower seat   Additional Comments: Plans to get grab bars installed in the shower/around toilet. Plans to sleep in a recliner when she gets home      Prior Functioning/Environment Level of Independence: Needs assistance  Gait / Transfers Assistance Needed: Ambulates household distances with 4ww ADL's / Homemaking Assistance Needed: Pt reports requiring assistance from friend for LB dressing; has prior training with  sock aide and would be interested in using one. Has been using shower chair to bathe in shower for ~6-45month. Does not drive d/t vision and does not work. Enjoys reading, but unable to read with current visual impairments (diabetic retinopathy), interested in learning more about audiobooks   Comments: Per inital PT eval: "Patient also reports she has had 30 falls in the past 6 months and has been in and out of rehabilitation facilities this year"        OT Problem List: Decreased strength;Decreased activity tolerance;Impaired balance (sitting and/or standing);Decreased coordination;Decreased safety awareness;Cardiopulmonary status limiting activity      OT Treatment/Interventions: Self-care/ADL training;Therapeutic exercise;Energy conservation;DME and/or AE instruction;Therapeutic activities;Patient/family education;Balance training    OT Goals(Current goals can be found in the care plan section) Acute Rehab OT Goals Patient Stated Goal: to go home OT Goal Formulation: With patient Time For Goal Achievement: 06/22/20 Potential to Achieve Goals: Fair ADL Goals Pt Will Perform Grooming: with modified independence;sitting Pt Will Perform Lower Body Dressing: with supervision;sit to/from stand Pt Will Transfer to Toilet: with supervision;stand pivot transfer;bedside commode  OT Frequency: Min 1X/week    AM-PAC OT "6 Clicks" Daily Activity     Outcome Measure Help from another person eating meals?: None Help from another person taking care of personal grooming?: A Little Help from another person toileting, which includes using toliet, bedpan, or urinal?: A  Lot Help from another person bathing (including washing, rinsing, drying)?: A Lot Help from another person to put on and taking off regular upper body clothing?: None Help from another person to put on and taking off regular lower body clothing?: A Lot 6 Click Score: 17   End of Session Nurse Communication: Mobility status  Activity  Tolerance: Patient tolerated treatment well Patient left: in bed;with call bell/phone within reach;with bed alarm set  OT Visit Diagnosis: Unsteadiness on feet (R26.81);History of falling (Z91.81)                Time: 1523-1600 OT Time Calculation (min): 37 min Charges:  OT General Charges $OT Visit: 1 Visit OT Evaluation $OT Eval Moderate Complexity: 1 Mod OT Treatments $Self Care/Home Management : 8-22 mins $Therapeutic Activity: 8-22 mins  Fredirick Maudlin, OTR/L [place]

## 2020-06-08 NOTE — TOC Progression Note (Signed)
Transition of Care [facility]) - Progression Note    Patient Details  Name: Katie [name] MRN: [id number] Date of Birth: June 04, 1955  Transition of Care [facility] CM/SW Contact  Anselm Pancoast, RN Phone Number: 06/08/2020, 3:28 PM  Clinical Narrative:     Received message from Joaquim Nam to outreach to [place] @ [phone]. RN CM attempted outreach and no answer. VM full-unable to leave message.   Notified Tammy of inability to make contact with anyone from [facility].    Expected Discharge Plan: Skilled Nursing Facility Barriers to Discharge: Continued Medical Work up,Homeless with medical needs  Expected Discharge Plan and Services Expected Discharge Plan: [place] In-house Referral: Clinical Social Work Discharge Planning Services: Homebound not met per provider Post Acute Care Choice: [place] Living arrangements for the past 2 months: No permanent address (Lived with Sister and family)                 DME Arranged: N/A DME Agency: NA       HH Arranged: NA HH Agency: NA         Social Determinants of Health (SDOH) Interventions    Readmission Risk Interventions No flowsheet data found.

## 2020-06-08 NOTE — Progress Notes (Signed)
Mobility Specialist - Progress Note   06/08/20 1500  Mobility  Activity Stood at bedside  Level of Assistance Minimal assist, patient does 75% or more  Assistive Device Front wheel walker  Distance Ambulated (ft) 0 ft  Mobility Response Tolerated well  Mobility performed by Mobility specialist  $Mobility charge 1 Mobility    Pre-mobility: 70 HR, 98% SpO2 Post-mobility: 77 HR, 97% SpO2   Pt was lying in bed upon arrival utilizing room air. Pt agreed to session. Pt denied nausea and fatigue, but did c/o pain in normal pain areas (head, neck, back). Pt's BP was measured: Supine: 150/80 (95) EOB: 126/65 (81) Standing: 78/58 (66) Pt c/o mild dizziness while sitting that progresses to moderate dizziness upon standing. Pt stated, "I do feel dizzy but not like I'm going to pass out or anything." While standing, pt also begin c/o knees feeling shaky. Further OOB activity deferred. Pt sat back EOB and performed seated march. Overall, pt tolerated session well. Pt was left in bed with all needs in reach and alarm set. Nurse notified.    Kathee Delton Mobility Specialist 06/08/20, 3:05 PM

## 2020-06-09 ENCOUNTER — Ambulatory Visit: Payer: Self-pay | Admitting: Family

## 2020-06-09 NOTE — Progress Notes (Signed)
Physical Therapy Treatment Patient Details Name: Katie [name] MRN: [id number] DOB: 17-Apr-1956 Today's Date: 06/09/2020    History of Present Illness Patient is a 65 y.o. female with a history of diabetes mellitus type 2, stage IV CKD, heart failure, hypertension. Patient presented secondary to dyspnea with evidence of volume overload and pleural effusion. Diagnosed with moderate pericardial effusion, mild to moderate aortic insufficiency and aortic stenosis.  S/p R heart cath 12/28.  R temp fem cath placed 12/29; 1/3 R IJ permcath placed; 1/4 R temp fem cath removed.    PT Comments    Pt was long sitting in bed upon arriving. Cleared by MD to participate in PT and OOB activity. BP in bed 191/85. Sitting EOB 156/74 with no symptoms or c/o of orthostatic hypotension. Stood 1 x EOB and remained standing long enough for BP to read prior to c/o dizziness. BP in standing 78/49. Pt has been struggling with this for years (per patient). Will discuss pt's case with primary therapist. Pt may only be safe to perform slide board transfers to w/c going forward. Did discuss this with pt. She is very frustrated but understands that it is unsafe to trial ambulation or activity away from EOB with BP dropping so significantly. Will discuss Acute PT goals with primary PT and adjust care plan accordingly.     Follow Up Recommendations  SNF;Supervision for mobility/OOB     Equipment Recommendations  Wheelchair (measurements PT);Wheelchair cushion (measurements PT);Rolling walker with 5" wheels;3in1 (PT)    Recommendations for Other Services       Precautions / Restrictions Precautions Precautions: Fall Precaution Comments: R IJ permcath    Mobility  Bed Mobility Overal bed mobility: Modified Independent   Transfers Overall transfer level: Needs assistance Equipment used: Rolling walker (2 wheeled) Transfers: Sit to/from Stand Sit to Stand: Min guard;Min assist;From elevated surface          General transfer comment: Pt was able to STS from EOB however continues to have extremel orthostatic hypotension  Ambulation/Gait  General Gait Details: unable due to orthostatic hypotension      Balance Overall balance assessment: Needs assistance Sitting-balance support: No upper extremity supported;Feet supported Sitting balance-Leahy Scale: Good Sitting balance - Comments: no LOB sitting EOB while performing ADLs with bilateral UE   Standing balance support: Bilateral upper extremity supported;During functional activity Standing balance-Leahy Scale: Fair       Cognition Arousal/Alertness: Awake/alert Behavior During Therapy: WFL for tasks assessed/performed Overall Cognitive Status: Within Functional Limits for tasks assessed        General Comments: Pt is A and O x 4. cooperative and pleasant throughout             Pertinent Vitals/Pain Pain Assessment: No/denies pain Pain Score: 0-No pain Pain Location: Headache Pain Descriptors / Indicators: Headache Pain Intervention(s): Limited activity within patient's tolerance;Monitored during session;Repositioned;Premedicated before session           PT Goals (current goals can now be found in the care plan section) Acute Rehab PT Goals Patient Stated Goal: to go home Progress towards PT goals: Progressing toward goals    Frequency    Min 2X/week      PT Plan Current plan remains appropriate       AM-PAC PT "6 Clicks" Mobility   Outcome Measure  Help needed turning from your back to your side while in a flat bed without using bedrails?: A Little Help needed moving from lying on your back to sitting on the side of  a flat bed without using bedrails?: A Little Help needed moving to and from a bed to a chair (including a wheelchair)?: A Little Help needed standing up from a chair using your arms (e.g., wheelchair or bedside chair)?: A Little Help needed to walk in hospital room?: A Little Help needed  climbing 3-5 steps with a railing? : A Little 6 Click Score: 18    End of Session Equipment Utilized During Treatment: Gait belt Activity Tolerance: Patient tolerated treatment well;Treatment limited secondary to medical complications (Comment) (limited by orthostatic hypotension) Patient left: in bed;with call bell/phone within reach;with bed alarm set;Other (comment) Nurse Communication: Mobility status;Other (comment) PT Visit Diagnosis: Unsteadiness on feet (R26.81);Muscle weakness (generalized) (M62.81);History of falling (Z91.81);Other abnormalities of gait and mobility (R26.89)     Time: [id number] PT Time Calculation (min) (ACUTE ONLY): 24 min  Charges:  $Therapeutic Activity: 23-37 mins                     Julaine Fusi PTA 06/09/20, 4:34 PM

## 2020-06-09 NOTE — Progress Notes (Signed)
Mobility Specialist - Progress Note   06/09/20 1352  Mobility  Activity Dangled on edge of bed (seated exercises)  Level of Assistance Standby assist, set-up cues, supervision of patient - no hands on  Assistive Device Other (Comment) (bed rails)  Mobility Response Tolerated well  Mobility performed by Mobility specialist  $Mobility charge 1 Mobility    Orthostat BP: Supine: 151/68 Sitting: 123/74  Pt laying in bed upon arrival. Pt agreed to session. Pt able to get to EOB SBA. Pt dangled EOB. Pt performed 2 sets x 20 reps of seated ex: ankle pumps, kicks, marches, and hip ab/ad. Pt c/o "feeling a little woozy". Further mobility limited for safety. Pt shares hx of being orthostatic. However, pt motivated this session. Pt requests to complete 20 more reps of marches before laying back down. Pt able to scoot up towards HOB SBA. Overall, pt tolerated session well. Pt left laying in bed w/ alarm set. All needs placed in reach.      Kalena Mander Mobility Specialist  06/09/20, 1:58 PM

## 2020-06-09 NOTE — Progress Notes (Signed)
PROGRESS NOTE    Katie [name]  [id number] DOB: Dec 12, 1955 DOA: 04/30/2020 PCP: Patient, [name]   Brief Narrative: Taken from prior notes. Katie [name] a 65 y.o.female with a history of diabetes mellitus type 2, stage IV CKD, heart failure, hypertension. Patient presented secondary to dyspnea with evidence of volume overload and pleural effusion.  Patient has had an extended hospital course. Her acute medical issues have essentially resolved at this point. She is medically stable for discharge however we do not have a safe disposition plan. Patient does not have stable living and lives with a friend here. She states that the friend likely will not be able to care for her.  TOC working to find placement however multiple facilities have refused at this point. Senior case management involved. Expanding search.  Patient remains stable.Continues to be very unmotivated always having pain complaints when PT attempted to work with her however then later complains of buttock pain from sitting too much. Been reinforce the need for exertion and ambulation daily basis.  Pt also very orthostatic and having orthostatics checked daily.remained positive for orthostasis despite starting on midodrine and TED hose, will try abdominal binder, most likely autonomic dysfunction.   Subjective: Patient was seen and examined.  Lying comfortably in bed.  No new complaint.  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)   Hyperkalemia   Pressure injury of skin   Pleural effusion   Acute heart failure with preserved ejection fraction (HFpEF) (HCC)   Chronic kidney disease (CKD), stage IV (severe) ([place])   Pulmonary hypertension, unspecified (HCC)   ATN (acute tubular necrosis) (HCC)  HFpEF.  Echo done in December 2021 with  normal EF and grade 2 diastolic dysfunction.Associated RV systolic dysfunction with elevated PA pressure seen on TTE although RHC on 12/28 significant for mildly elevated right heart/pulmonary artery pressures.    She was initially admitted with volume overload.  Initially managed with IV Lasix, progress to ESRD requiring dialysis, currently volume is being managed with dialysis.  Appears euvolemic. Difficult disposition due to unsafe discharge, apparently she was like living with a friend who cannot take care of her anymore.  TOC is working for places accepting facility. -Continue with dialysis. -Continue with torsemide.  Pleural effusion In setting of fluid overload with new diagnosis of grade 2 diastolic dysfunction  Echo 05/01/20 with nml EF S/p diagnostic Rt thoracentesis performed on 12/20 No evidence of maligancy on cytology  Diet controlled diabetes mellitus, type II.  CBG well controlled and A1c of 4.7 which is against the diagnosis of diabetes. -Currently diet controlled -Continue to monitor.  Orthostatic hypotension.  Patient remained significantly orthostatic positive, most likely autonomic dysfunction.  Baseline blood pressure mildly elevated today. -Continue with midodrine -Continue with TED hose -Can try abdominal binder -Continue to monitor  ESRD-MWF- Unknown baseline. 2/2 HTN and atherosclerosis Hx/o NSADS Patient is unsure of her primary nephrologist in [place].  Creatinine of 2.97 on admission.  Per patient, she was planned to start hemodialysis in addition to consideration for renal transplant.  Nephrology consulted on 12/20.  Worsening renal function in setting of attempted Lasix diuresis for heart failure.  She was started on dialysis by nephrology. Patient on midodrine 10 mg on dialysis days due to orthostatic hypotension which she becomes very symptomatic Midodrine 5 mg  p.o. 3 times daily started on on 1/22 besides the 10 mg given on dialysis day Expected  [place] Pending financial clearance for discharge planning TOC attempting disposition to SNF Nephrology following.  Moderate pericardial effusion No clinical evidence of tamponade.  No recent infections.  In setting of CKD V with elevated BUN. No chest pain. -cardiology rec. Outpatient Echo for surveillance Hemodynamics stable , continue to monitor  Mild/moderate aortic stenosis Unsure if this is symptomatic.  Patient does have dyspnea with exertion but this may be multifactorial. -Cardiology recommendations: outpatient Transthoracic Echocardiogram for surveillance.  Essential hypertension.  Blood pressure mildly elevated but she has significant drop with change in position.  On multiple meds which include carvedilol, losartan, torsemide along with midodrine to see if that will help with her positive orthostatic vitals. Patient needs midodrine during dialysis. -Continue current management and monitor  Anemia Possible related to underlying kidney disease.  Evidence of macrocytosis on CBC.  TIBC low but otherwise normal iron panel. Vitamin B12 level 358, target > 400, started oral supplement and folate normal.  Pressure injury Mid coccyx, POA  History of tobacco use Patient has quit for at least 10 years. Previously, 20 pack year history.  Headache Normal chronic headache. Patient states she takes Imitrex as needed.  Continue Tylenol and oxycodone as needed 1/8 s/p Fioricet 1 dose 1/9 IV Solu-Medrol 60 mg x 1 dose, Benadryl 25 mg IV x1 dose and Phenergan 25 mg IV x1 dose given  Insomnia, started trazodone and melatonin Continue melatonin and trazodone nightly,seems to be helping patient to sleep  Obesity Body mass index is 30.91 kg/m.  Objective: Vitals:   06/09/20 0527 06/09/20 0801 06/09/20 1145 06/09/20 1522  BP: (!) 158/69 (!) 158/73 (!) 190/79 (!) 189/73  Pulse: 66 69 70 72  Resp:  '17 17 16  '$ Temp: 98.1 F (36.7 C) 98.3 F (36.8 C) 98.6 F  (37 C) 98.1 F (36.7 C)  TempSrc:  Oral    SpO2: 97% 97% 97% 99%  Weight:      Height:        Intake/Output Summary (Last 24 hours) at 06/09/2020 1609 Last data filed at 06/09/2020 0100 Gross per 24 hour  Intake --  Output 350 ml  Net -350 ml   Filed Weights   06/03/20 0539 06/07/20 0524 06/08/20 0527  Weight: 75.9 kg 75.9 kg 77.7 kg    Examination:  General exam: Appears calm and comfortable  Respiratory system: Clear to auscultation. Respiratory effort normal. Cardiovascular system: S1 & S2 heard, RRR.  Gastrointestinal system: Soft, nontender, nondistended, bowel sounds positive. Central nervous system: Alert and oriented. No focal neurological deficits. Extremities: No edema, no cyanosis, pulses intact and symmetrical. Psychiatry: Judgement and insight appear normal.     DVT prophylaxis: Heparin Code Status: Full Family Communication: Discussed with patient Disposition Plan:  Status is: Inpatient  Remains inpatient appropriate because:Unsafe d/c plan   Dispo: The patient is from: Home              Anticipated d/c is to: SNF              Anticipated d/c date is: 1 day              Patient currently is medically stable to d/c.   Difficult to place patient Yes              Level of care: Med-Surg  Consultants:   Nephrology  Procedures:  Antimicrobials:   Data Reviewed:  I have personally reviewed following labs and imaging studies  CBC: Recent Labs  Lab 06/06/20 1224  WBC 6.6  NEUTROABS 4.2  HGB 8.5*  HCT 26.0*  MCV 95.9  PLT [id number]   Basic Metabolic Panel: Recent Labs  Lab 06/03/20 1242 06/04/20 0951 06/06/20 1224  NA 139  --  139  K 3.8  --  3.5  CL 102  --  103  CO2 29  --  31  GLUCOSE 126*  --  144*  BUN 19  --  24*  CREATININE 1.48*  --  1.40*  CALCIUM 7.9*  --  8.0*  PHOS  --  4.7* 3.2   GFR: Estimated Creatinine Clearance: 43.9 mL/min (A) (by C-G formula based on SCr of 1.4 mg/dL (H)). Liver Function Tests: Recent Labs  Lab  06/06/20 1224  ALBUMIN 2.3*   No results for input(s): LIPASE, AMYLASE in the last 168 hours. No results for input(s): AMMONIA in the last 168 hours. Coagulation Profile: No results for input(s): INR, PROTIME in the last 168 hours. Cardiac Enzymes: No results for input(s): CKTOTAL, CKMB, CKMBINDEX, TROPONINI in the last 168 hours. BNP (last 3 results) No results for input(s): PROBNP in the last 8760 hours. HbA1C: No results for input(s): HGBA1C in the last 72 hours. CBG: No results for input(s): GLUCAP in the last 168 hours. Lipid Profile: No results for input(s): CHOL, HDL, LDLCALC, TRIG, CHOLHDL, LDLDIRECT in the last 72 hours. Thyroid Function Tests: No results for input(s): TSH, T4TOTAL, FREET4, T3FREE, THYROIDAB in the last 72 hours. Anemia Panel: No results for input(s): VITAMINB12, FOLATE, FERRITIN, TIBC, IRON, RETICCTPCT in the last 72 hours. Sepsis Labs: No results for input(s): PROCALCITON, LATICACIDVEN in the last 168 hours.  No results found for this or any previous visit (from the past 240 hour(s)).   Radiology Studies: No results found.  Scheduled Meds: . benzonatate  200 mg Oral TID  . carvedilol  12.5 mg Oral BID WC  . Chlorhexidine Gluconate Cloth  6 each Topical Q0600  . cholecalciferol  1,000 Units Oral Daily  . epoetin (EPOGEN/PROCRIT) injection  10,000 Units Intravenous Q M,W,F-HD  . feeding supplement  237 mL Oral BID BM  . heparin  5,000 Units Subcutaneous Q8H  . lidocaine  1 patch Transdermal Q24H  . losartan  75 mg Oral Daily  . melatonin  5 mg Oral QPM  . methocarbamol  500 mg Oral QID  . midodrine  5 mg Oral TID WC  . multivitamin  1 tablet Oral QHS  . polyethylene glycol  17 g Oral Daily  . senna-docusate  1 tablet Oral BID  . sevelamer carbonate  800 mg Oral TID WC  . sodium chloride flush  3 mL Intravenous Q12H  . topiramate  25 mg Oral BID  . torsemide  40 mg Oral Daily  . traZODone  50 mg Oral QPM  . vitamin B-12  100 mcg Oral Daily    Continuous Infusions: . sodium chloride       LOS: 39 days   Time spent: 35 minutes.  Lorella Nimrod, MD [facility]  If 7PM-7AM, please contact night-coverage [email or web address]  06/09/2020, 4:09 PM   This record has been created using Systems analyst. Errors have been sought and corrected,but may not always be located. Such creation errors do not reflect on the standard of care.

## 2020-06-09 NOTE — Progress Notes (Signed)
[facility]  ROUNDING NOTE   Subjective:   Patient states she feels that same. States she did not sleep well.   Skipped dialysis yesterday. Patient states she has no shortness of breath or peripheral edema  Objective:  Vital signs in last 24 hours:  Temp:  [98.1 F (36.7 C)-98.6 F (37 C)] 98.3 F (36.8 C) (01/27 0801) Pulse Rate:  [66-69] 69 (01/27 0801) Resp:  [16-18] 17 (01/27 0801) BP: (148-165)/(66-73) 158/73 (01/27 0801) SpO2:  [97 %-99 %] 97 % (01/27 0801)  Weight change:  Filed Weights   06/03/20 0539 06/07/20 0524 06/08/20 0527  Weight: 75.9 kg 75.9 kg 77.7 kg    Intake/Output: I/O last 3 completed shifts: In: -  Out: 650 [Urine:650]   Intake/Output this shift:  No intake/output data recorded.  Physical Exam: General:  Laying in bed, no acute distress  HEENT  normocephalic, atraumatic  Pulm/lungs  clear bilateral  CVS/Heart  regular  Abdomen:   Soft, nontender, nondistended  Extremities:  No lower extremity edema  Neurologic:  AAOx4  Skin:  No acute rashes, or lesions   Access: RIJ permcath 05/16/20 Dr. Lucky Cowboy    Basic Metabolic Panel: Recent Labs  Lab 06/03/20 1242 06/04/20 0951 06/06/20 1224  NA 139  --  139  K 3.8  --  3.5  CL 102  --  103  CO2 29  --  31  GLUCOSE 126*  --  144*  BUN 19  --  24*  CREATININE 1.48*  --  1.40*  CALCIUM 7.9*  --  8.0*  PHOS  --  4.7* 3.2    Liver Function Tests: Recent Labs  Lab 06/06/20 1224  ALBUMIN 2.3*   No results for input(s): LIPASE, AMYLASE in the last 168 hours. No results for input(s): AMMONIA in the last 168 hours.  CBC: Recent Labs  Lab 06/06/20 1224  WBC 6.6  NEUTROABS 4.2  HGB 8.5*  HCT 26.0*  MCV 95.9  PLT 253    Cardiac Enzymes: No results for input(s): CKTOTAL, CKMB, CKMBINDEX, TROPONINI in the last 168 hours.  BNP: Invalid input(s): POCBNP  CBG: No results for input(s): GLUCAP in the last 168 hours.  Microbiology: Results for orders placed or performed  during the hospital encounter of 04/30/20  Resp Panel by RT-PCR (Flu A&B, Covid) Nasopharyngeal Swab     Status: None   Collection Time: 04/30/20  4:01 PM   Specimen: Nasopharyngeal Swab; Nasopharyngeal(NP) swabs in vial transport medium  Result Value Ref Range Status   SARS Coronavirus 2 by RT PCR NEGATIVE NEGATIVE Final    Comment: (NOTE) SARS-CoV-2 target nucleic acids are NOT DETECTED.  The SARS-CoV-2 RNA is generally detectable in upper respiratory specimens during the acute phase of infection. The lowest concentration of SARS-CoV-2 viral copies this assay can detect is 138 copies/mL. A negative result does not preclude SARS-Cov-2 infection and should not be used as the sole basis for treatment or other patient management decisions. A negative result may occur with  improper specimen collection/handling, submission of specimen other than nasopharyngeal swab, presence of viral mutation(s) within the areas targeted by this assay, and inadequate number of viral copies(<138 copies/mL). A negative result must be combined with clinical observations, patient history, and epidemiological information. The expected result is Negative.  Fact Sheet for Patients:  EntrepreneurPulse.com.au  Fact Sheet for Healthcare Providers:  IncredibleEmployment.be  This test is no t yet approved or cleared by the [place] FDA and  has been authorized for detection  and/or diagnosis of SARS-CoV-2 by FDA under an Emergency Use Authorization (EUA). This EUA will remain  in effect (meaning this test can be used) for the duration of the COVID-19 declaration under Section 564(b)(1) of the Act, 21 U.S.C.section 360bbb-3(b)(1), unless the authorization is terminated  or revoked sooner.       Influenza A by PCR NEGATIVE NEGATIVE Final   Influenza B by PCR NEGATIVE NEGATIVE Final    Comment: (NOTE) The Xpert Xpress SARS-CoV-2/FLU/RSV plus assay is intended as an  aid in the diagnosis of influenza from Nasopharyngeal swab specimens and should not be used as a sole basis for treatment. Nasal washings and aspirates are unacceptable for Xpert Xpress SARS-CoV-2/FLU/RSV testing.  Fact Sheet for Patients: EntrepreneurPulse.com.au  Fact Sheet for Healthcare Providers: IncredibleEmployment.be  This test is not yet approved or cleared by the [place] FDA and has been authorized for detection and/or diagnosis of SARS-CoV-2 by FDA under an Emergency Use Authorization (EUA). This EUA will remain in effect (meaning this test can be used) for the duration of the COVID-19 declaration under Section 564(b)(1) of the Act, 21 U.S.C. section 360bbb-3(b)(1), unless the authorization is terminated or revoked.  Performed at [facility], [address] [place], [place] [address]   Body fluid culture     Status: None   Collection Time: 05/02/20  4:44 PM   Specimen: PATH Cytology Pleural fluid  Result Value Ref Range Status   Specimen Description   Final    PLEURAL Performed at [facility], [address] [place], [place] [address]    Special Requests   Final    NONE Performed at [facility], [address] [place], [place] [address]    Gram Stain   Final    MODERATE WBC PRESENT, PREDOMINANTLY MONONUCLEAR NO ORGANISMS SEEN    Culture   Final    NO GROWTH Performed at [facility], [place] [address] [place], [place] [address]    Report Status 05/06/2020 FINAL  Final  MRSA PCR Screening     Status: None   Collection Time: 05/16/20  9:29 PM   Specimen: Nasal Mucosa; Nasopharyngeal  Result Value Ref Range Status   MRSA by PCR NEGATIVE NEGATIVE Final    Comment:        The GeneXpert MRSA Assay (FDA approved for NASAL specimens only), is one component of a comprehensive MRSA colonization surveillance program. It is not intended to diagnose MRSA infection nor to guide  or monitor treatment for MRSA infections. Performed at [facility], [address] [place], [place] [address]     Coagulation Studies: No results for input(s): LABPROT, INR in the last 72 hours.  Urinalysis: No results for input(s): COLORURINE, LABSPEC, PHURINE, GLUCOSEU, HGBUR, BILIRUBINUR, KETONESUR, PROTEINUR, UROBILINOGEN, NITRITE, LEUKOCYTESUR in the last 72 hours.  Invalid input(s): APPERANCEUR    Imaging: No results found.   Medications:   . sodium chloride     . benzonatate  200 mg Oral TID  . carvedilol  12.5 mg Oral BID WC  . Chlorhexidine Gluconate Cloth  6 each Topical Q0600  . cholecalciferol  1,000 Units Oral Daily  . epoetin (EPOGEN/PROCRIT) injection  10,000 Units Intravenous Q M,W,F-HD  . feeding supplement  237 mL Oral BID BM  . heparin  5,000 Units Subcutaneous Q8H  . lidocaine  1 patch Transdermal Q24H  . losartan  75 mg Oral Daily  . melatonin  5 mg Oral QPM  . methocarbamol  500 mg  Oral QID  . midodrine  5 mg Oral TID WC  . multivitamin  1 tablet Oral QHS  . polyethylene glycol  17 g Oral Daily  . senna-docusate  1 tablet Oral BID  . sevelamer carbonate  800 mg Oral TID WC  . sodium chloride flush  3 mL Intravenous Q12H  . topiramate  25 mg Oral BID  . torsemide  40 mg Oral Daily  . traZODone  50 mg Oral QPM  . vitamin B-12  100 mcg Oral Daily   sodium chloride, acetaminophen **OR** acetaminophen, ALPRAZolam, bisacodyl, chlorpheniramine-HYDROcodone, ondansetron **OR** ondansetron (ZOFRAN) IV, oxyCODONE-acetaminophen, promethazine, sodium chloride flush, SUMAtriptan  Assessment/ Plan:  [name] is a 65 y.o. white female with diastolic congestive heart failure, hypertension, diabetes mellitus type II who is admitted to [facility] on 04/30/2020 for Chronic kidney disease (CKD), stage IV (severe) ([place]) [N18.4] Volume overload [E87.70]  Patient initiated on hemodialysis this admission.   1. End stage renal disease  - Changed to  twice a week dialysis, Mondays and Fridays. Dialysis for tomorrow. Plan to have patient seated in a chair  2. Hypertension with lower extremity edema:  With autonomic orthostatics. Now off amlodipine and midodrine.  - Current regimen of losartan, carvedilol, and torsemide.  - Will continue to monitor.   3. Anemia of chronic kidney disease:  Lab Results  Component Value Date   HGB 8.5 (L) 06/06/2020  EPO with HD  4. Secondary hyperparathyroidism Lab Results  Component Value Date   PTH 70 (H) 05/12/2020   CALCIUM 8.0 (L) 06/06/2020   PHOS 3.2 06/06/2020  - sevelamer with meals    LOS: 39 Masashi Snowdon 1/27/20229:39 AM

## 2020-06-09 NOTE — TOC Progression Note (Signed)
Transition of Care [facility]) - Progression Note    Patient Details  Name: Katie [name] MRN: [id number] Date of Birth: Dec 04, 1955  [facility] of [facility]) CM/SW Contact  Anselm Pancoast, RN Phone Number: 06/09/2020, 1:55 PM  Clinical Narrative:    Reviewed case with leadership and request made to reach out to [facility] for potential HD and LOG bed.   LVMM for [facility] @ [phone] requesting review for potential SNF placement.    Expected Discharge Plan: Skilled Nursing Facility Barriers to Discharge: Continued Medical Work up,Homeless with medical needs  Expected Discharge Plan and Services Expected Discharge Plan: [place] In-house Referral: Clinical Social Work Discharge Planning Services: Homebound not met per provider Post Acute Care Choice: [place] Living arrangements for the past 2 months: No permanent address (Lived with Sister and family)                 DME Arranged: N/A DME Agency: NA       HH Arranged: NA HH Agency: NA         Social Determinants of Health (SDOH) Interventions    Readmission Risk Interventions No flowsheet data found.

## 2020-06-10 LAB — HEPATITIS B SURFACE ANTIGEN: Hepatitis B Surface Ag: NONREACTIVE

## 2020-06-10 NOTE — Progress Notes (Signed)
Due to referral being greater than 14 days, referral will need to be reinitiated. Please contact me when patient is discharge ready with bed placement.

## 2020-06-10 NOTE — Progress Notes (Signed)
Blood lines are reversed due to high AP RN aware

## 2020-06-10 NOTE — Progress Notes (Addendum)
Progress Note    Katie [name]  [id number] DOB: October 21, 1955  DOA: 04/30/2020 PCP: Patient, [name]      Brief Narrative:    Medical records reviewed and are as summarized below:  Katie [name] is a 65 y.o. female with a history of diabetes mellitus type 2, stage IV CKD, heart failure, hypertension. Patient presented secondary to dyspnea with evidence of volume overload and pleural effusion.  Patient has had an extended hospital course. Her acute medical issues have essentially resolved at this point. She is medically stable for discharge however we do not have a safe disposition plan. Patient does not have stable living and lives with a friend here. She states that the friend likely will not be able to care for her.  TOC working to find placement however multiple facilities have refused at this point. Senior case management involved. Expanding search.  Patient remains stable.Continues to be very unmotivated always having pain complaints when PT attempted to work with her however then later complains of buttock pain from sitting too much.Been reinforce the need for exertion and ambulation daily basis.  Pt also very orthostaticand having orthostatics checked daily.remained positive for orthostasis despite starting on midodrine and TED hose      Assessment/Plan:   Principal Problem:   Volume overload Active Problems:   Type 2 diabetes mellitus with stage 5 chronic kidney disease (HCC)   Hypertension   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HCC)   Hyperkalemia   Pressure injury of skin   Pleural effusion   Acute heart failure with preserved ejection fraction (HFpEF) (HCC)   Chronic kidney disease (CKD), stage IV (severe) ([place])   Pulmonary hypertension, unspecified (HCC)   ATN (acute tubular necrosis) (HCC)   Nutrition Problem: Increased nutrient needs Etiology: chronic illness (CKD IV with temp HD, CHF)  Signs/Symptoms: estimated  needs   Body mass index is 27.72 kg/m.       HFpEF.  Echo done in December 2021 with normal EF and grade 2 diastolic dysfunction.Associated RV systolic dysfunction with elevated PA pressure seen on TTE although RHC on 12/28 significant for mildly elevated right heart/pulmonary artery pressures.   She was initially admitted with volume overload.  Initially managed with IV Lasix, progress to ESRD requiring dialysis, currently volume is being managed with dialysis.  Appears euvolemic. Difficult disposition due to unsafe discharge, apparently she was like living with a friend who cannot take care of her anymore.  TOC is working for places accepting facility. -Continue with dialysis. -Continue with torsemide.  Pleural effusion In setting of fluid overload with new diagnosis of grade 2 diastolic dysfunction Echo 05/01/20 with nml EF S/p diagnostic Rt thoracentesis performed on 12/20 No evidence of maligancy on cytology  Diet controlled diabetes mellitus, type II.  CBG well controlled and A1c of 4.7 which is against the diagnosis of diabetes. -Currently diet controlled -Continue to monitor.  Orthostatic hypotension.  Patient remained significantly orthostatic positive, most likely autonomic dysfunction.  -Continue with midodrine -Continue with TED hose -Continue to monitor  ESRD-MWF- Unknown baseline. 2/2 HTN and atherosclerosis Hx/o NSAIDS Patient is unsure of her primary nephrologist in [place].  Creatinine of 2.97 on admission.  Per patient, she was planned to start hemodialysis in addition to consideration for renal transplant.  Nephrology consulted on 12/20.  Worsening renal function in setting of attempted Lasix diuresis for heart failure.  She was started on dialysis by nephrology. Patient on midodrine 10 mg on dialysis  days due to orthostatic hypotension which she becomes very symptomatic Midodrine 5 mg p.o. 3 times daily started on on 1/22 besides the 10 mg given on  dialysis day Expected [facility] Pending financial clearance for discharge planning TOC attempting disposition to SNF Nephrology following.  Moderate pericardial effusion No clinical evidence of tamponade.  No recent infections.  In setting of CKD V with elevated BUN. No chest pain. -cardiology rec. Outpatient Echo for surveillance Hemodynamics stable , continue to monitor  Mild/moderate aortic stenosis -Cardiology recommendations: outpatient Transthoracic Echocardiogram for surveillance.  Essential hypertension.   Continue antihypertensives.   Patient needs midodrine during dialysis. -Continue current management and monitor  Anemia Possible related to underlying kidney disease.  Evidence of macrocytosis on CBC.  TIBC low but otherwise normal iron panel. Vitamin B12 level 358, target > 400, started oral supplement and folate normal.  Pressure injury Mid coccyx, POA  History of tobacco use Patient has quit for at least 10 years. Previously, 20 pack year history.  Headache Normal chronic headache. Patient states she takes Imitrex as needed.  Continue Tylenol and oxycodone as needed 1/8 s/p Fioricet 1 dose 1/9 IV Solu-Medrol 60 mg x 1 dose, Benadryl 25 mg IV x1 dose and Phenergan 25 mg IV x1 dose given  Insomnia,  Continue melatonin and trazodone nightly        Pressure Injury 04/30/20 Coccyx Mid Stage 2 -  Partial thickness loss of dermis presenting as a shallow open injury with a red, pink wound bed without slough. (Active)  04/30/20 2130  Location: Coccyx  Location Orientation: Mid  Staging: Stage 2 -  Partial thickness loss of dermis presenting as a shallow open injury with a red, pink wound bed without slough.  Wound Description (Comments):   Present on Admission: Yes               Diet Order            Diet 2 gram sodium Room service appropriate? Yes; Fluid consistency: Thin; Fluid restriction: 1200 mL Fluid  Diet effective  now                    Consultants:  Nephrologist  Vascular surgeon  Cardiologist  Procedures:  Right-sided thoracentesis with removal of 1600 cc of fluid on 05/02/2020    Medications:   . benzonatate  200 mg Oral TID  . carvedilol  12.5 mg Oral BID WC  . Chlorhexidine Gluconate Cloth  6 each Topical Q0600  . cholecalciferol  1,000 Units Oral Daily  . epoetin (EPOGEN/PROCRIT) injection  10,000 Units Intravenous Q M,W,F-HD  . feeding supplement  237 mL Oral BID BM  . heparin  5,000 Units Subcutaneous Q8H  . lidocaine  1 patch Transdermal Q24H  . losartan  75 mg Oral Daily  . melatonin  5 mg Oral QPM  . methocarbamol  500 mg Oral QID  . midodrine  5 mg Oral TID WC  . multivitamin  1 tablet Oral QHS  . polyethylene glycol  17 g Oral Daily  . senna-docusate  1 tablet Oral BID  . sevelamer carbonate  800 mg Oral TID WC  . sodium chloride flush  3 mL Intravenous Q12H  . topiramate  25 mg Oral BID  . torsemide  40 mg Oral Daily  . traZODone  50 mg Oral QPM  . vitamin B-12  100 mcg Oral Daily   Continuous Infusions: . sodium chloride       Anti-infectives (From  admission, onward)   Start     Dose/Rate Route Frequency Ordered Stop   05/16/20 0905  clindamycin (CLEOCIN) 300 MG/50ML IVPB       Note to Pharmacy: Carlynn Spry   : cabinet override      05/16/20 0905 05/16/20 0920             Family Communication/Anticipated D/C date and plan/Code Status   DVT prophylaxis: heparin injection 5,000 Units Start: 05/10/20 2200     Code Status: Full Code  Family Communication: None Disposition Plan:    Status is: Inpatient  Remains inpatient appropriate because:Unsafe d/c plan   Dispo: The patient is from: Home              Anticipated d/c is to: SNF              Anticipated d/c date is: > 3 days              Patient currently is medically stable to d/c.   Difficult to place patient Yes           Subjective:   Interval events  noted.  She has no complaints.  Objective:    Vitals:   06/10/20 1100 06/10/20 1115 06/10/20 1130 06/10/20 1145  BP: 135/67 115/61 109/61 106/60  Pulse: 65 68 65 66  Resp: '10 10 11 10  '$ Temp:      TempSrc:      SpO2:      Weight:      Height:       No data found.   Intake/Output Summary (Last 24 hours) at 06/10/2020 1200 Last data filed at 06/10/2020 0432 Gross per 24 hour  Intake --  Output 500 ml  Net -500 ml   Filed Weights   06/07/20 0524 06/08/20 0527 06/10/20 0500  Weight: 75.9 kg 77.7 kg 77.9 kg    Exam:  GEN: NAD SKIN: Warm and dry.  Stage II coccygeal decubitus ulcer EYES: No pallor or icterus ENT: MMM CV: RRR PULM: CTA B ABD: soft, ND, NT, +BS CNS: AAO x 3, non focal EXT: No edema or tenderness   Data Reviewed:   I have personally reviewed following labs and imaging studies:  Labs: Labs show the following:   Basic Metabolic Panel: Recent Labs  Lab 06/03/20 1242 06/04/20 0951 06/06/20 1224  NA 139  --  139  K 3.8  --  3.5  CL 102  --  103  CO2 29  --  31  GLUCOSE 126*  --  144*  BUN 19  --  24*  CREATININE 1.48*  --  1.40*  CALCIUM 7.9*  --  8.0*  PHOS  --  4.7* 3.2   GFR Estimated Creatinine Clearance: 43.9 mL/min (A) (by C-G formula based on SCr of 1.4 mg/dL (H)). Liver Function Tests: Recent Labs  Lab 06/06/20 1224  ALBUMIN 2.3*   No results for input(s): LIPASE, AMYLASE in the last 168 hours. No results for input(s): AMMONIA in the last 168 hours. Coagulation profile No results for input(s): INR, PROTIME in the last 168 hours.  CBC: Recent Labs  Lab 06/06/20 1224  WBC 6.6  NEUTROABS 4.2  HGB 8.5*  HCT 26.0*  MCV 95.9  PLT 253   Cardiac Enzymes: No results for input(s): CKTOTAL, CKMB, CKMBINDEX, TROPONINI in the last 168 hours. BNP (last 3 results) No results for input(s): PROBNP in the last 8760 hours. CBG: No results for input(s): GLUCAP in the last 168 hours.  D-Dimer: No results for input(s): DDIMER in the  last 72 hours. Hgb A1c: No results for input(s): HGBA1C in the last 72 hours. Lipid Profile: No results for input(s): CHOL, HDL, LDLCALC, TRIG, CHOLHDL, LDLDIRECT in the last 72 hours. Thyroid function studies: No results for input(s): TSH, T4TOTAL, T3FREE, THYROIDAB in the last 72 hours.  Invalid input(s): FREET3 Anemia work up: No results for input(s): VITAMINB12, FOLATE, FERRITIN, TIBC, IRON, RETICCTPCT in the last 72 hours. Sepsis Labs: Recent Labs  Lab 06/06/20 1224  WBC 6.6    Microbiology No results found for this or any previous visit (from the past 240 hour(s)).  Procedures and diagnostic studies:  No results found.             LOS: 40 days   Jakin Copywriter, advertising on [email or web address]. If 7PM-7AM, please contact night-coverage at [email or web address]     06/10/2020, 12:00 PM

## 2020-06-10 NOTE — Progress Notes (Signed)
[facility]  ROUNDING NOTE   Subjective:   Seen and examined on hemodialysis treatment. Tolerating treatment well. Seated in chair.   Patient did walk yesterday.     HEMODIALYSIS FLOWSHEET:  Blood Flow Rate (mL/min): 400 mL/min Arterial Pressure (mmHg): -150 mmHg Venous Pressure (mmHg): 110 mmHg Transmembrane Pressure (mmHg): 50 mmHg Ultrafiltration Rate (mL/min): 600 mL/min Dialysate Flow Rate (mL/min): 600 ml/min Conductivity: Machine : 13.9 Conductivity: Machine : 13.9 Dialysis Fluid Bolus: Normal Saline Bolus Amount (mL): 250 mL Dialysate Change: Other (comment)    Objective:  Vital signs in last 24 hours:  Temp:  [97.5 F (36.4 C)-98.9 F (37.2 C)] 98.9 F (37.2 C) (01/28 1013) Pulse Rate:  [64-72] 66 (01/28 1145) Resp:  [10-22] 10 (01/28 1145) BP: (106-189)/(60-73) 106/60 (01/28 1145) SpO2:  [96 %-99 %] 97 % (01/28 0730) Weight:  [77.9 kg] 77.9 kg (01/28 0500)  Weight change:  Filed Weights   06/07/20 0524 06/08/20 0527 06/10/20 0500  Weight: 75.9 kg 77.7 kg 77.9 kg    Intake/Output: I/O last 3 completed shifts: In: -  Out: 850 [Urine:850]   Intake/Output this shift:  No intake/output data recorded.  Physical Exam: General:  Laying in bed, no acute distress  HEENT  normocephalic, atraumatic  Pulm/lungs  clear bilateral  CVS/Heart  regular  Abdomen:   Soft, nontender, nondistended  Extremities:  No lower extremity edema  Neurologic:  AAOx4  Skin:  No acute rashes, or lesions   Access: RIJ permcath 05/16/20 Dr. Lucky Cowboy    Basic Metabolic Panel: Recent Labs  Lab 06/04/20 0951 06/06/20 1224  NA  --  139  K  --  3.5  CL  --  103  CO2  --  31  GLUCOSE  --  144*  BUN  --  24*  CREATININE  --  1.40*  CALCIUM  --  8.0*  PHOS 4.7* 3.2    Liver Function Tests: Recent Labs  Lab 06/06/20 1224  ALBUMIN 2.3*   No results for input(s): LIPASE, AMYLASE in the last 168 hours. No results for input(s): AMMONIA in the last 168  hours.  CBC: Recent Labs  Lab 06/06/20 1224  WBC 6.6  NEUTROABS 4.2  HGB 8.5*  HCT 26.0*  MCV 95.9  PLT 253    Cardiac Enzymes: No results for input(s): CKTOTAL, CKMB, CKMBINDEX, TROPONINI in the last 168 hours.  BNP: Invalid input(s): POCBNP  CBG: No results for input(s): GLUCAP in the last 168 hours.  Microbiology: Results for orders placed or performed during the hospital encounter of 04/30/20  Resp Panel by RT-PCR (Flu A&B, Covid) Nasopharyngeal Swab     Status: None   Collection Time: 04/30/20  4:01 PM   Specimen: Nasopharyngeal Swab; Nasopharyngeal(NP) swabs in vial transport medium  Result Value Ref Range Status   SARS Coronavirus 2 by RT PCR NEGATIVE NEGATIVE Final    Comment: (NOTE) SARS-CoV-2 target nucleic acids are NOT DETECTED.  The SARS-CoV-2 RNA is generally detectable in upper respiratory specimens during the acute phase of infection. The lowest concentration of SARS-CoV-2 viral copies this assay can detect is 138 copies/mL. A negative result does not preclude SARS-Cov-2 infection and should not be used as the sole basis for treatment or other patient management decisions. A negative result may occur with  improper specimen collection/handling, submission of specimen other than nasopharyngeal swab, presence of viral mutation(s) within the areas targeted by this assay, and inadequate number of viral copies(<138 copies/mL). A negative result must be combined with clinical  observations, patient history, and epidemiological information. The expected result is Negative.  Fact Sheet for Patients:  EntrepreneurPulse.com.au  Fact Sheet for Healthcare Providers:  IncredibleEmployment.be  This test is no t yet approved or cleared by the [place] FDA and  has been authorized for detection and/or diagnosis of SARS-CoV-2 by FDA under an Emergency Use Authorization (EUA). This EUA will remain  in effect (meaning this  test can be used) for the duration of the COVID-19 declaration under Section 564(b)(1) of the Act, 21 U.S.C.section 360bbb-3(b)(1), unless the authorization is terminated  or revoked sooner.       Influenza A by PCR NEGATIVE NEGATIVE Final   Influenza B by PCR NEGATIVE NEGATIVE Final    Comment: (NOTE) The Xpert Xpress SARS-CoV-2/FLU/RSV plus assay is intended as an aid in the diagnosis of influenza from Nasopharyngeal swab specimens and should not be used as a sole basis for treatment. Nasal washings and aspirates are unacceptable for Xpert Xpress SARS-CoV-2/FLU/RSV testing.  Fact Sheet for Patients: EntrepreneurPulse.com.au  Fact Sheet for Healthcare Providers: IncredibleEmployment.be  This test is not yet approved or cleared by the [place] FDA and has been authorized for detection and/or diagnosis of SARS-CoV-2 by FDA under an Emergency Use Authorization (EUA). This EUA will remain in effect (meaning this test can be used) for the duration of the COVID-19 declaration under Section 564(b)(1) of the Act, 21 U.S.C. section 360bbb-3(b)(1), unless the authorization is terminated or revoked.  Performed at [facility], [address] [place], [place] [address]   Body fluid culture     Status: None   Collection Time: 05/02/20  4:44 PM   Specimen: PATH Cytology Pleural fluid  Result Value Ref Range Status   Specimen Description   Final    PLEURAL Performed at [facility], [address], [place], [place] [address]    Special Requests   Final    NONE Performed at [facility], [address] [place], [place] [address]    Gram Stain   Final    MODERATE WBC PRESENT, PREDOMINANTLY MONONUCLEAR NO ORGANISMS SEEN    Culture   Final    NO GROWTH Performed at [facility], [place] [address] [place], [place] [address]    Report Status 05/06/2020 FINAL  Final  MRSA PCR Screening     Status: None    Collection Time: 05/16/20  9:29 PM   Specimen: Nasal Mucosa; Nasopharyngeal  Result Value Ref Range Status   MRSA by PCR NEGATIVE NEGATIVE Final    Comment:        The GeneXpert MRSA Assay (FDA approved for NASAL specimens only), is one component of a comprehensive MRSA colonization surveillance program. It is not intended to diagnose MRSA infection nor to guide or monitor treatment for MRSA infections. Performed at [facility], [address] [place], [place] [address]     Coagulation Studies: No results for input(s): LABPROT, INR in the last 72 hours.  Urinalysis: No results for input(s): COLORURINE, LABSPEC, PHURINE, GLUCOSEU, HGBUR, BILIRUBINUR, KETONESUR, PROTEINUR, UROBILINOGEN, NITRITE, LEUKOCYTESUR in the last 72 hours.  Invalid input(s): APPERANCEUR    Imaging: No results found.   Medications:   . sodium chloride     . benzonatate  200 mg Oral TID  . carvedilol  12.5 mg Oral BID WC  . Chlorhexidine Gluconate Cloth  6 each Topical Q0600  . cholecalciferol  1,000 Units Oral Daily  . epoetin (EPOGEN/PROCRIT) injection  10,000 Units Intravenous Q M,W,F-HD  .  feeding supplement  237 mL Oral BID BM  . heparin  5,000 Units Subcutaneous Q8H  . lidocaine  1 patch Transdermal Q24H  . losartan  75 mg Oral Daily  . melatonin  5 mg Oral QPM  . methocarbamol  500 mg Oral QID  . midodrine  5 mg Oral TID WC  . multivitamin  1 tablet Oral QHS  . polyethylene glycol  17 g Oral Daily  . senna-docusate  1 tablet Oral BID  . sevelamer carbonate  800 mg Oral TID WC  . sodium chloride flush  3 mL Intravenous Q12H  . topiramate  25 mg Oral BID  . torsemide  40 mg Oral Daily  . traZODone  50 mg Oral QPM  . vitamin B-12  100 mcg Oral Daily   sodium chloride, acetaminophen **OR** acetaminophen, ALPRAZolam, bisacodyl, chlorpheniramine-HYDROcodone, ondansetron **OR** ondansetron (ZOFRAN) IV, oxyCODONE-acetaminophen, promethazine, sodium chloride flush,  SUMAtriptan  Assessment/ Plan:  Ms. Spenser Sortino is a 65 y.o. white female with diastolic congestive heart failure, hypertension, diabetes mellitus type II, CVA who is admitted to [facility] on 04/30/2020 for Chronic kidney disease (CKD), stage IV (severe) ([place]) [N18.4] Volume overload [E87.70]  Patient moved from [place] in December 2021. She was following with nephrology there. Patient initiated on hemodialysis this admission. Patient has a prolonged hospitalization. She was initially admitted with volume overload and acute exacerbation of congestive heart failure. Failed IV diuretics and started on hemodialysis on 12/29. No outpatient dialysis due to patient's lack of insurance and not a resident of the state of [place]. Held dialysis for possible recovery however after several days, creatinine rising and volume accumulation. Patient then resumed on dialysis. She has been weaned down to twice a week.   1. End stage renal disease  - Changed to twice a week dialysis, Mondays and Fridays. Seated in chair and tolerating treatment well.  Creatinine is now low, recommend hold dialysis and monitor for renal recovery  2. Hypertension with lower extremity edema:  With autonomic orthostatics. Now off amlodipine - Current regimen of losartan, carvedilol, and torsemide.  - Continue midodrine  3. Anemia of chronic kidney disease:  Lab Results  Component Value Date   HGB 8.5 (L) 06/06/2020  EPO with HD treatments  4. Secondary hyperparathyroidism Lab Results  Component Value Date   PTH 70 (H) 05/12/2020   CALCIUM 8.0 (L) 06/06/2020   PHOS 3.2 06/06/2020  - sevelamer with meals    LOS: 40 Melayah Skorupski 1/28/20221:22 PM

## 2020-06-10 NOTE — Progress Notes (Signed)
Physical Therapy Treatment Patient Details Name: Katie [name] MRN: [id number] DOB: 08-04-1955 Today's Date: 06/10/2020    History of Present Illness Patient is a 65 y.o. female with a history of diabetes mellitus type 2, stage IV CKD, heart failure, hypertension. Patient presented secondary to dyspnea with evidence of volume overload and pleural effusion. Diagnosed with moderate pericardial effusion, mild to moderate aortic insufficiency and aortic stenosis.  S/p R heart cath 12/28.  R temp fem cath placed 12/29; 1/3 R IJ permcath placed; 1/4 R temp fem cath removed.    PT Comments    Pt was pleasant and motivated to participate during the session and put forth good effort throughout.  Pt's BP/HR with [facility] donned: supine 143/70, 75 bpm; sitting: 95/60, 79 bpm (asymptomatic); standing: 71/48, 91 bpm (moderately dizzy); sitting after 3-4 min: 106/65, 77 bpm (asymptomatic).  Pt's SpO2 WNL on room air throughout.  Pt able to stand long enough for BP to be taken before symptoms progressed to where she needed to return to sitting.  Pt would likely be safe to transfer to chair with +2 assist for safety but pt declined being up in chair for comfort.  Pt education provided on benefits of chair time.  Pt will benefit from PT services in a SNF setting upon discharge to safely address deficits listed in patient problem list for decreased caregiver assistance and eventual return to PLOF.     Follow Up Recommendations  SNF;Supervision for mobility/OOB     Equipment Recommendations  Wheelchair (measurements PT);Wheelchair cushion (measurements PT);Rolling walker with 5" wheels;3in1 (PT) (Confirm with patient regarding w/c)    Recommendations for Other Services       Precautions / Restrictions Precautions Precautions: Fall Precaution Comments: R IJ permcath Restrictions Other Position/Activity Restrictions: Pt Orthostatic, monitor BP with change of position    Mobility  Bed Mobility Overal bed  mobility: Modified Independent             General bed mobility comments: Extra time and effort only  Transfers Overall transfer level: Needs assistance Equipment used: Rolling walker (2 wheeled) Transfers: Sit to/from Stand Sit to Stand: Min guard;+2 safety/equipment         General transfer comment: Extra effort to stand but no physical assist required; +2 utilized for safety only secondary to pt's h/o orthostatic hypotension  Ambulation/Gait             General Gait Details: unable due to orthostatic hypotension   Stairs             Wheelchair Mobility    Modified Rankin (Stroke Patients Only)       Balance Overall balance assessment: Needs assistance Sitting-balance support: No upper extremity supported;Feet supported Sitting balance-Leahy Scale: Good     Standing balance support: Bilateral upper extremity supported;During functional activity Standing balance-Leahy Scale: Fair Standing balance comment: Min A for stability upon coming to initial stand                            Cognition Arousal/Alertness: Awake/alert Behavior During Therapy: WFL for tasks assessed/performed Overall Cognitive Status: Within Functional Limits for tasks assessed                                        Exercises Total Joint Exercises Ankle Circles/Pumps: AROM;Strengthening;Both;10 reps;15 reps Quad Sets: Strengthening;Both;10 reps;15 reps Gluteal Sets: Strengthening;Both;10  reps;15 reps Towel Squeeze: Strengthening;Both;10 reps;15 reps Heel Slides: Strengthening;Both;10 reps Hip ABduction/ADduction: Strengthening;Both;10 reps Straight Leg Raises: Strengthening;Both;10 reps Long Arc Quad: AROM;Strengthening;Both;10 reps;15 reps Knee Flexion: AROM;Strengthening;Both;10 reps;15 reps Marching in Standing: Strengthening;Both;10 reps;15 reps;Seated Bridges: Strengthening;Both;10 reps    General Comments        Pertinent Vitals/Pain  Pain Assessment: No/denies pain    Home Living                      Prior Function            PT Goals (current goals can now be found in the care plan section) Progress towards PT goals: Not progressing toward goals - comment (limited by orthostatic hypotension)    Frequency    Min 2X/week      PT Plan Current plan remains appropriate    Co-evaluation              AM-PAC PT "6 Clicks" Mobility   Outcome Measure  Help needed turning from your back to your side while in a flat bed without using bedrails?: A Little Help needed moving from lying on your back to sitting on the side of a flat bed without using bedrails?: A Little Help needed moving to and from a bed to a chair (including a wheelchair)?: A Little Help needed standing up from a chair using your arms (e.g., wheelchair or bedside chair)?: A Little Help needed to walk in hospital room?: A Lot Help needed climbing 3-5 steps with a railing? : A Lot 6 Click Score: 16    End of Session Equipment Utilized During Treatment: Gait belt Activity Tolerance: Treatment limited secondary to medical complications (Comment) (orthostatic hypotension) Patient left: in bed;with call bell/phone within reach;with bed alarm set Nurse Communication: Mobility status;Other (comment) (Orthostatic vitals, results sent to nsg and MD) PT Visit Diagnosis: Unsteadiness on feet (R26.81);Muscle weakness (generalized) (M62.81);History of falling (Z91.81);Other abnormalities of gait and mobility (R26.89)     Time: [id number] PT Time Calculation (min) (ACUTE ONLY): 40 min  Charges:  $Therapeutic Exercise: 23-37 mins $Therapeutic Activity: 8-22 mins                     D. Scott Annalyse Langlais PT, DPT 06/10/20, 4:16 PM

## 2020-06-10 NOTE — Progress Notes (Signed)
Patient off unit for HD and patient placed in HD chair for treatment.

## 2020-06-10 NOTE — Progress Notes (Signed)
OT Cancellation Note  Patient Details Name: Katie [name] MRN: [id number] DOB: November 11, 1955   Cancelled Treatment:    Reason Eval/Treat Not Completed: Patient at procedure or test/ unavailable  Pt OTF for HD at this time. Will f/u for OT treatment at later date/time as able/pt available. Thank you.  Gerrianne Scale, [place], OTR/L ascom [phone] 06/10/20, 11:29 AM

## 2020-06-11 LAB — CBC
HCT: 27.8 % — ABNORMAL LOW (ref 36.0–46.0)
Hemoglobin: 8.9 g/dL — ABNORMAL LOW (ref 12.0–15.0)
MCH: 31 pg (ref 26.0–34.0)
MCHC: 32 g/dL (ref 30.0–36.0)
MCV: 96.9 fL (ref 80.0–100.0)
Platelets: 270 10*3/uL (ref 150–400)
RBC: 2.87 MIL/uL — ABNORMAL LOW (ref 3.87–5.11)
RDW: 14.3 % (ref 11.5–15.5)
WBC: 6.7 10*3/uL (ref 4.0–10.5)
nRBC: 0 % (ref 0.0–0.2)

## 2020-06-11 LAB — BASIC METABOLIC PANEL
Anion gap: 9 (ref 5–15)
BUN: 45 mg/dL — ABNORMAL HIGH (ref 8–23)
CO2: 26 mmol/L (ref 22–32)
Calcium: 8.5 mg/dL — ABNORMAL LOW (ref 8.9–10.3)
Chloride: 100 mmol/L (ref 98–111)
Creatinine, Ser: 2.79 mg/dL — ABNORMAL HIGH (ref 0.44–1.00)
GFR, Estimated: 19 mL/min — ABNORMAL LOW (ref >60)
Glucose, Bld: 134 mg/dL — ABNORMAL HIGH (ref 70–99)
Potassium: 4.2 mmol/L (ref 3.5–5.1)
Sodium: 135 mmol/L (ref 135–145)

## 2020-06-11 LAB — PHOSPHORUS: Phosphorus: 5.2 mg/dL — ABNORMAL HIGH (ref 2.5–4.6)

## 2020-06-11 LAB — HEPATITIS B SURFACE ANTIBODY, QUANTITATIVE: Hep B S AB Quant (Post): 8.6 m[IU]/mL — ABNORMAL LOW (ref >9.9)

## 2020-06-11 NOTE — Progress Notes (Signed)
Progress Note    Katie [name]  [id number] DOB: 1955/08/25  DOA: 04/30/2020 PCP: Patient, [name]      Brief Narrative:    Medical records reviewed and are as summarized below:  Katie [name] is a 65 y.o. female with a history of diabetes mellitus type 2, stage IV CKD, heart failure, hypertension. Patient presented secondary to dyspnea with evidence of volume overload and pleural effusion.  Patient has had an extended hospital course. Her acute medical issues have essentially resolved at this point. She is medically stable for discharge however we do not have a safe disposition plan. Patient does not have stable living and lives with a friend here. She states that the friend likely will not be able to care for her.  TOC working to find placement however multiple facilities have refused at this point. Senior case management involved. Expanding search.  Patient remains stable.Continues to be very unmotivated always having pain complaints when PT attempted to work with her however then later complains of buttock pain from sitting too much.Been reinforce the need for exertion and ambulation daily basis.  Pt also very orthostaticand having orthostatics checked daily.remained positive for orthostasis despite starting on midodrine and TED hose      Assessment/Plan:   Principal Problem:   Volume overload Active Problems:   Type 2 diabetes mellitus with stage 5 chronic kidney disease (HCC)   Hypertension   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HCC)   Hyperkalemia   Pressure injury of skin   Pleural effusion   Acute heart failure with preserved ejection fraction (HFpEF) (HCC)   Chronic kidney disease (CKD), stage IV (severe) ([facility])   Pulmonary hypertension, unspecified (HCC)   ATN (acute tubular necrosis) (HCC)   Nutrition Problem: Increased nutrient needs Etiology: chronic illness (CKD IV with temp HD, CHF)  Signs/Symptoms: estimated  needs   Body mass index is 27.28 kg/m.       HFpEF.  Echo done in December 2021 with normal EF and grade 2 diastolic dysfunction.Associated RV systolic dysfunction with elevated PA pressure seen on TTE although RHC on 12/28 significant for mildly elevated right heart/pulmonary artery pressures.   She was initially admitted with volume overload.  Initially managed with IV Lasix, progress to ESRD requiring dialysis, currently volume is being managed with dialysis.  Appears euvolemic. Difficult disposition due to unsafe discharge, apparently she was living with a friend who cannot take care of her anymore.   Continue with hemodialysis.  Schedule has been changing Mondays and Fridays. Continue torsemide  Pleural effusion In setting of fluid overload with new diagnosis of grade 2 diastolic dysfunction Echo 05/01/20 with nml EF S/p diagnostic Rt thoracentesis performed on 12/20 No evidence of maligancy on cytology  Diet controlled diabetes mellitus, type II.  CBG well controlled and A1c of 4.7  -Currently diet controlled -Continue to monitor.  Orthostatic hypotension.  Patient remained significantly orthostatic positive, most likely autonomic dysfunction.  -Continue with midodrine -Continue with TED hose -Continue to monitor  ESRD-MWF- Unknown baseline. 2/2 HTN and atherosclerosis Hx/o NSAIDS Patient is unsure of her primary nephrologist in [place].  Creatinine of 2.97 on admission.  Per patient, she was planned to start hemodialysis in addition to consideration for renal transplant.  Nephrology consulted on 12/20.  Worsening renal function in setting of attempted Lasix diuresis for heart failure.  She was started on dialysis by nephrology. Patient on midodrine 10 mg on dialysis days due to orthostatic hypotension which  she becomes very symptomatic Midodrine 5 mg p.o. 3 times daily started on on 1/22 besides the 10 mg given on dialysis day Pending financial clearance for  discharge planning TOC attempting disposition to SNF Nephrology following.  Moderate pericardial effusion No clinical evidence of tamponade.  No recent infections.  In setting of CKD V with elevated BUN. No chest pain. -cardiology rec. Outpatient Echo for surveillance Hemodynamics stable , continue to monitor  Mild/moderate aortic stenosis -Cardiology recommendations: outpatient Transthoracic Echocardiogram for surveillance.  Essential hypertension.   Continue antihypertensives.   Patient needs midodrine during dialysis. -Continue current management and monitor  Anemia Possible related to underlying kidney disease.  Evidence of macrocytosis on CBC.  TIBC low but otherwise normal iron panel. Vitamin B12 level 358, target > 400, started oral supplement and folate normal.  Pressure injury Mid coccyx, POA  History of tobacco use Patient has quit for at least 10 years. Previously, 20 pack year history.  Headache Normal chronic headache. Patient states she takes Imitrex as needed.  Continue Tylenol and oxycodone as needed 1/8 s/p Fioricet 1 dose 1/9 IV Solu-Medrol 60 mg x 1 dose, Benadryl 25 mg IV x1 dose and Phenergan 25 mg IV x1 dose given  Insomnia,  Continue melatonin and trazodone nightly        Pressure Injury 04/30/20 Coccyx Mid Stage 2 -  Partial thickness loss of dermis presenting as a shallow open injury with a red, pink wound bed without slough. (Active)  04/30/20 2130  Location: Coccyx  Location Orientation: Mid  Staging: Stage 2 -  Partial thickness loss of dermis presenting as a shallow open injury with a red, pink wound bed without slough.  Wound Description (Comments):   Present on Admission: Yes               Diet Order            Diet 2 gram sodium Room service appropriate? Yes; Fluid consistency: Thin; Fluid restriction: 1200 mL Fluid  Diet effective now                    Consultants:  Nephrologist  Vascular  surgeon  Cardiologist  Procedures:  Right-sided thoracentesis with removal of 1600 cc of fluid on 05/02/2020    Medications:   . benzonatate  200 mg Oral TID  . carvedilol  12.5 mg Oral BID WC  . Chlorhexidine Gluconate Cloth  6 each Topical Q0600  . cholecalciferol  1,000 Units Oral Daily  . epoetin (EPOGEN/PROCRIT) injection  10,000 Units Intravenous Q M,W,F-HD  . feeding supplement  237 mL Oral BID BM  . heparin  5,000 Units Subcutaneous Q8H  . lidocaine  1 patch Transdermal Q24H  . losartan  75 mg Oral Daily  . melatonin  5 mg Oral QPM  . methocarbamol  500 mg Oral QID  . midodrine  5 mg Oral TID WC  . multivitamin  1 tablet Oral QHS  . polyethylene glycol  17 g Oral Daily  . senna-docusate  1 tablet Oral BID  . sevelamer carbonate  800 mg Oral TID WC  . sodium chloride flush  3 mL Intravenous Q12H  . topiramate  25 mg Oral BID  . torsemide  40 mg Oral Daily  . traZODone  50 mg Oral QPM  . vitamin B-12  100 mcg Oral Daily   Continuous Infusions: . sodium chloride       Anti-infectives (From admission, onward)   Start     Dose/Rate  Route Frequency Ordered Stop   05/16/20 0905  clindamycin (CLEOCIN) 300 MG/50ML IVPB       Note to Pharmacy: Carlynn Spry   : cabinet override      05/16/20 0905 05/16/20 0920             Family Communication/Anticipated D/C date and plan/Code Status   DVT prophylaxis: heparin injection 5,000 Units Start: 05/10/20 2200     Code Status: Full Code  Family Communication: None Disposition Plan:    Status is: Inpatient  Remains inpatient appropriate because:Unsafe d/c plan   Dispo: The patient is from: Home              Anticipated d/c is to: SNF              Anticipated d/c date is: > 3 days              Patient currently is medically stable to d/c.   Difficult to place patient Yes           Subjective:   Interval events noted.  No shortness of breath or chest pain.  She just feels  weak.  Objective:    Vitals:   06/10/20 2321 06/11/20 0311 06/11/20 0658 06/11/20 0741  BP: (!) 139/58 (!) 143/65  (!) 99/47  Pulse: 71 68  72  Resp: '16 18  16  '$ Temp: 98.7 F (37.1 C) 98 F (36.7 C)  98.6 F (37 C)  TempSrc: Oral Oral  Oral  SpO2: 97% 97%  97%  Weight:   76.7 kg   Height:       Orthostatic VS for the past 24 hrs:  BP- Lying Pulse- Lying BP- Sitting Pulse- Sitting BP- Standing at 0 minutes Pulse- Standing at 0 minutes  06/10/20 1605 143/70 75 95/60 79 (!) 71/48 91     Intake/Output Summary (Last 24 hours) at 06/11/2020 1040 Last data filed at 06/10/2020 2320 Gross per 24 hour  Intake 0 ml  Output 1100 ml  Net -1100 ml   Filed Weights   06/08/20 0527 06/10/20 0500 06/11/20 0658  Weight: 77.7 kg 77.9 kg 76.7 kg    Exam:  GEN: NAD SKIN: Warm and dry.  Stage III coccygeal decubitus ulcer EYES: No pallor or icterus ENT: MMM CV: RRR PULM: CTA B ABD: soft, ND, NT, +BS CNS: AAO x 3, non focal EXT: No edema or tenderness     Data Reviewed:   I have personally reviewed following labs and imaging studies:  Labs: Labs show the following:   Basic Metabolic Panel: Recent Labs  Lab 06/06/20 1224  NA 139  K 3.5  CL 103  CO2 31  GLUCOSE 144*  BUN 24*  CREATININE 1.40*  CALCIUM 8.0*  PHOS 3.2   GFR Estimated Creatinine Clearance: 43.6 mL/min (A) (by C-G formula based on SCr of 1.4 mg/dL (H)). Liver Function Tests: Recent Labs  Lab 06/06/20 1224  ALBUMIN 2.3*   No results for input(s): LIPASE, AMYLASE in the last 168 hours. No results for input(s): AMMONIA in the last 168 hours. Coagulation profile No results for input(s): INR, PROTIME in the last 168 hours.  CBC: Recent Labs  Lab 06/06/20 1224 06/11/20 1023  WBC 6.6 6.7  NEUTROABS 4.2  --   HGB 8.5* 8.9*  HCT 26.0* 27.8*  MCV 95.9 96.9  PLT 253 270   Cardiac Enzymes: No results for input(s): CKTOTAL, CKMB, CKMBINDEX, TROPONINI in the last 168 hours. BNP (last 3  results) No  results for input(s): PROBNP in the last 8760 hours. CBG: No results for input(s): GLUCAP in the last 168 hours. D-Dimer: No results for input(s): DDIMER in the last 72 hours. Hgb A1c: No results for input(s): HGBA1C in the last 72 hours. Lipid Profile: No results for input(s): CHOL, HDL, LDLCALC, TRIG, CHOLHDL, LDLDIRECT in the last 72 hours. Thyroid function studies: No results for input(s): TSH, T4TOTAL, T3FREE, THYROIDAB in the last 72 hours.  Invalid input(s): FREET3 Anemia work up: No results for input(s): VITAMINB12, FOLATE, FERRITIN, TIBC, IRON, RETICCTPCT in the last 72 hours. Sepsis Labs: Recent Labs  Lab 06/06/20 1224 06/11/20 1023  WBC 6.6 6.7    Microbiology No results found for this or any previous visit (from the past 240 hour(s)).  Procedures and diagnostic studies:  No results found.             LOS: 41 days   [employer], advertising on [email or web address]. If 7PM-7AM, please contact night-coverage at [email or web address]     06/11/2020, 10:40 AM

## 2020-06-11 NOTE — Progress Notes (Signed)
Katie [name]  MRN: [id number]  DOB/AGE: 1955-08-12 65 y.o.  Primary Care Physician:Patient, [name]  Admit date: 04/30/2020  Chief Complaint:  Chief Complaint  Patient presents with  . Weakness    S-Pt presented on  04/30/2020 with  Chief Complaint  Patient presents with  . Weakness  .    Pt today feels better patient main complaint in today visit was I did my dialysis on a chair earlier  Medications . benzonatate  200 mg Oral TID  . carvedilol  12.5 mg Oral BID WC  . Chlorhexidine Gluconate Cloth  6 each Topical Q0600  . cholecalciferol  1,000 Units Oral Daily  . epoetin (EPOGEN/PROCRIT) injection  10,000 Units Intravenous Q M,W,F-HD  . feeding supplement  237 mL Oral BID BM  . heparin  5,000 Units Subcutaneous Q8H  . lidocaine  1 patch Transdermal Q24H  . losartan  75 mg Oral Daily  . melatonin  5 mg Oral QPM  . methocarbamol  500 mg Oral QID  . midodrine  5 mg Oral TID WC  . multivitamin  1 tablet Oral QHS  . polyethylene glycol  17 g Oral Daily  . senna-docusate  1 tablet Oral BID  . sevelamer carbonate  800 mg Oral TID WC  . sodium chloride flush  3 mL Intravenous Q12H  . topiramate  25 mg Oral BID  . torsemide  40 mg Oral Daily  . traZODone  50 mg Oral QPM  . vitamin B-12  100 mcg Oral Daily         [id number] from the symptoms mentioned above,there are no other symptoms referable to all systems reviewed.  Physical Exam: Vital signs in last 24 hours: Temp:  [98 F (36.7 C)-99 F (37.2 C)] 98.3 F (36.8 C) (01/29 1459) Pulse Rate:  [68-76] 68 (01/29 1459) Resp:  [16-20] 16 (01/29 1459) BP: (99-180)/(47-80) 162/69 (01/29 1459) SpO2:  [94 %-97 %] 94 % (01/29 1459) Weight:  [76.7 kg] 76.7 kg (01/29 0658) Weight change: -1.242 kg Last BM Date: 06/09/20  Intake/Output from previous day: 01/28 0701 - 01/29 0700 In: 0  Out: 1100 [Urine:100] No intake/output data recorded.   Physical Exam:  General- pt is awake,alert, oriented to time place  and person  Resp- No acute REsp distress, CTA B/L NO Rhonchi  CVS- S1S2 regular in rate and rhythm  GIT- BS+, soft, Non tender , Non distended  EXT- No LE Edema,  No Cyanosis  Access-tunneled cath   Lab Results:  CBC  Recent Labs    06/11/20 1023  WBC 6.7  HGB 8.9*  HCT 27.8*  PLT 270    BMET  Recent Labs    06/11/20 1023  NA 135  K 4.2  CL 100  CO2 26  GLUCOSE 134*  BUN 45*  CREATININE 2.79*  CALCIUM 8.5*      Most recent Creatinine trend  Lab Results  Component Value Date   CREATININE 2.79 (H) 06/11/2020   CREATININE 1.40 (H) 06/06/2020   CREATININE 1.48 (H) 06/03/2020      MICRO   No results found for this or any previous visit (from the past 240 hour(s)).       Impression:  Katie [name] is a 65 y.o.  Caucasian female with past medical history of diastolic congestive heart failure, hypertension, diabetes mellitus type II, CVA who is admitted to [facility] on 04/30/2020 for Chronic kidney disease (CKD), stage IV (severe) (HCC) [N18.4] Volume overload [E87.70]  Patient moved from  [place] in December 2021. She was following with nephrology there. Patient initiated on hemodialysis this admission. Patient has a prolonged hospitalization. She was initially admitted with volume overload and acute exacerbation of congestive heart failure. Failed IV diuretics and started on hemodialysis on 12/29. No outpatient dialysis due to patient's lack of insurance and not a resident of the state of [place]. Held dialysis for possible recovery however after several days, creatinine rising and volume accumulation. Patient then resumed on dialysis.  Patient is now requiring renal replacement therapy twice a week   1)Renal   ESRD Patient is on dialysis on Mondays and Fridays Patient was last dialyzed yesterday No need for renal placement therapy today   2) hypertension and orthostatic hypotension Patient is on midodrine Blood pressure is stable for her  state   3)Anemia of chronic disease  CBC Latest Ref Rng & Units 06/11/2020 06/06/2020 05/26/2020  WBC 4.0 - 10.5 K/uL 6.7 6.6 6.9  Hemoglobin 12.0 - 15.0 g/dL 8.9(L) 8.5(L) 7.8(L)  Hematocrit 36.0 - 46.0 % 27.8(L) 26.0(L) 24.9(L)  Platelets 150 - 400 K/uL 270 253 36       HGb is not at goal (9--11) Patient is on Epogen protocol  4) Secondary hyperparathyroidism -CKD Mineral-Bone Disorder    Lab Results  Component Value Date   PTH 70 (H) 05/12/2020   CALCIUM 8.5 (L) 06/11/2020   PHOS 5.2 (H) 06/11/2020    Secondary Hyperparathyroidism present Phosphorus at goal.   5) diabetes mellitus Being followed by the primary team   6) Electrolytes   BMP Latest Ref Rng & Units 06/11/2020 06/06/2020 06/03/2020  Glucose 70 - 99 mg/dL 134(H) 144(H) 126(H)  BUN 8 - 23 mg/dL 45(H) 24(H) 19  Creatinine 0.44 - 1.00 mg/dL 2.79(H) 1.40(H) 1.48(H)  Sodium 135 - 145 mmol/L 135 139 139  Potassium 3.5 - 5.1 mmol/L 4.2 3.5 3.8  Chloride 98 - 111 mmol/L 100 103 102  CO2 22 - 32 mmol/L '26 31 29  '$ Calcium 8.9 - 10.3 mg/dL 8.5(L) 8.0(L) 7.9(L)     Sodium Normonatremic   Potassium Normokalemic    7)Acid base Co2 at goal     Plan:  No need for renal placement therapy today We will continue to follow      Lottie Siska s [facility] 06/11/2020, 5:47 PM

## 2020-06-12 NOTE — Plan of Care (Signed)
  Problem: Education: Goal: Ability to demonstrate management of disease process will improve Outcome: Progressing Goal: Ability to verbalize understanding of medication therapies will improve Outcome: Progressing Goal: Individualized Educational Video(s) Outcome: Progressing   Problem: Activity: Goal: Capacity to carry out activities will improve Outcome: Progressing   Problem: Cardiac: Goal: Ability to achieve and maintain adequate cardiopulmonary perfusion will improve Outcome: Progressing   Problem: Education: Goal: Knowledge of General Education information will improve Description: Including pain rating scale, medication(s)/side effects and non-pharmacologic comfort measures Outcome: Progressing   Problem: Health Behavior/Discharge Planning: Goal: Ability to manage health-related needs will improve Outcome: Progressing   Problem: Clinical Measurements: Goal: Ability to maintain clinical measurements within normal limits will improve Outcome: Progressing Goal: Will remain free from infection Outcome: Progressing Goal: Diagnostic test results will improve Outcome: Progressing Goal: Respiratory complications will improve Outcome: Progressing Goal: Cardiovascular complication will be avoided Outcome: Progressing   Problem: Activity: Goal: Risk for activity intolerance will decrease Outcome: Progressing   Problem: Nutrition: Goal: Adequate nutrition will be maintained Outcome: Progressing   Problem: Coping: Goal: Level of anxiety will decrease Outcome: Progressing   Problem: Elimination: Goal: Will not experience complications related to bowel motility Outcome: Progressing Goal: Will not experience complications related to urinary retention Outcome: Progressing   Problem: Pain Managment: Goal: General experience of comfort will improve Outcome: Progressing   Problem: Safety: Goal: Ability to remain free from injury will improve Outcome: Progressing    Problem: Skin Integrity: Goal: Risk for impaired skin integrity will decrease Outcome: Progressing   Problem: Education: Goal: Knowledge of disease and its progression will improve Outcome: Progressing Goal: Individualized Educational Video(s) Outcome: Progressing   Problem: Fluid Volume: Goal: Compliance with measures to maintain balanced fluid volume will improve Outcome: Progressing   Problem: Health Behavior/Discharge Planning: Goal: Ability to manage health-related needs will improve Outcome: Progressing   Problem: Nutritional: Goal: Ability to make healthy dietary choices will improve Outcome: Progressing   Problem: Clinical Measurements: Goal: Complications related to the disease process, condition or treatment will be avoided or minimized Outcome: Progressing

## 2020-06-12 NOTE — Plan of Care (Signed)
Problem: Education: Goal: Ability to demonstrate management of disease process will improve 06/12/2020 1619 by Cristela Blue, RN Outcome: Progressing 06/12/2020 1619 by Cristela Blue, RN Outcome: Progressing Goal: Ability to verbalize understanding of medication therapies will improve 06/12/2020 1619 by Cristela Blue, RN Outcome: Progressing 06/12/2020 1619 by Cristela Blue, RN Outcome: Progressing Goal: Individualized Educational Video(s) 06/12/2020 1619 by Cristela Blue, RN Outcome: Progressing 06/12/2020 1619 by Cristela Blue, RN Outcome: Progressing   Problem: Activity: Goal: Capacity to carry out activities will improve 06/12/2020 1619 by Cristela Blue, RN Outcome: Progressing 06/12/2020 1619 by Cristela Blue, RN Outcome: Progressing   Problem: Cardiac: Goal: Ability to achieve and maintain adequate cardiopulmonary perfusion will improve 06/12/2020 1619 by Cristela Blue, RN Outcome: Progressing 06/12/2020 1619 by Cristela Blue, RN Outcome: Progressing   Problem: Education: Goal: Knowledge of General Education information will improve Description: Including pain rating scale, medication(s)/side effects and non-pharmacologic comfort measures 06/12/2020 1619 by Cristela Blue, RN Outcome: Progressing 06/12/2020 1619 by Cristela Blue, RN Outcome: Progressing   Problem: Health Behavior/Discharge Planning: Goal: Ability to manage health-related needs will improve 06/12/2020 1619 by Cristela Blue, RN Outcome: Progressing 06/12/2020 1619 by Cristela Blue, RN Outcome: Progressing   Problem: Clinical Measurements: Goal: Ability to maintain clinical measurements within normal limits will improve 06/12/2020 1619 by Cristela Blue, RN Outcome: Progressing 06/12/2020 1619 by Cristela Blue, RN Outcome: Progressing Goal: Will remain free from infection 06/12/2020 1619 by Cristela Blue, RN Outcome: Progressing 06/12/2020 1619 by Cristela Blue, RN Outcome:  Progressing Goal: Diagnostic test results will improve 06/12/2020 1619 by Cristela Blue, RN Outcome: Progressing 06/12/2020 1619 by Cristela Blue, RN Outcome: Progressing Goal: Respiratory complications will improve 06/12/2020 1619 by Cristela Blue, RN Outcome: Progressing 06/12/2020 1619 by Cristela Blue, RN Outcome: Progressing Goal: Cardiovascular complication will be avoided 06/12/2020 1619 by Cristela Blue, RN Outcome: Progressing 06/12/2020 1619 by Cristela Blue, RN Outcome: Progressing   Problem: Activity: Goal: Risk for activity intolerance will decrease 06/12/2020 1619 by Cristela Blue, RN Outcome: Progressing 06/12/2020 1619 by Cristela Blue, RN Outcome: Progressing   Problem: Nutrition: Goal: Adequate nutrition will be maintained 06/12/2020 1619 by Cristela Blue, RN Outcome: Progressing 06/12/2020 1619 by Cristela Blue, RN Outcome: Progressing   Problem: Coping: Goal: Level of anxiety will decrease 06/12/2020 1619 by Cristela Blue, RN Outcome: Progressing 06/12/2020 1619 by Cristela Blue, RN Outcome: Progressing   Problem: Elimination: Goal: Will not experience complications related to bowel motility 06/12/2020 1619 by Cristela Blue, RN Outcome: Progressing 06/12/2020 1619 by Cristela Blue, RN Outcome: Progressing Goal: Will not experience complications related to urinary retention 06/12/2020 1619 by Cristela Blue, RN Outcome: Progressing 06/12/2020 1619 by Cristela Blue, RN Outcome: Progressing   Problem: Pain Managment: Goal: General experience of comfort will improve 06/12/2020 1619 by Cristela Blue, RN Outcome: Progressing 06/12/2020 1619 by Cristela Blue, RN Outcome: Progressing   Problem: Safety: Goal: Ability to remain free from injury will improve 06/12/2020 1619 by Cristela Blue, RN Outcome: Progressing 06/12/2020 1619 by Cristela Blue, RN Outcome: Progressing   Problem: Skin Integrity: Goal: Risk for impaired skin integrity will  decrease 06/12/2020 1619 by Cristela Blue, RN Outcome: Progressing 06/12/2020 1619 by Cristela Blue, RN Outcome: Progressing   Problem: Education: Goal: Knowledge of disease and its progression will improve 06/12/2020 1619 by Cristela Blue, RN Outcome: Progressing 06/12/2020 1619 by Cristela Blue, RN Outcome: Progressing Goal: Individualized Educational Video(s) 06/12/2020 1619 by Cristela Blue, RN Outcome: Progressing 06/12/2020 1619 by Cristela Blue, RN Outcome: Progressing   Problem: Fluid Volume: Goal: Compliance with measures  to maintain balanced fluid volume will improve 06/12/2020 1619 by Cristela Blue, RN Outcome: Progressing 06/12/2020 1619 by Cristela Blue, RN Outcome: Progressing   Problem: Health Behavior/Discharge Planning: Goal: Ability to manage health-related needs will improve 06/12/2020 1619 by Cristela Blue, RN Outcome: Progressing 06/12/2020 1619 by Cristela Blue, RN Outcome: Progressing   Problem: Nutritional: Goal: Ability to make healthy dietary choices will improve 06/12/2020 1619 by Cristela Blue, RN Outcome: Progressing 06/12/2020 1619 by Cristela Blue, RN Outcome: Progressing   Problem: Clinical Measurements: Goal: Complications related to the disease process, condition or treatment will be avoided or minimized 06/12/2020 1619 by Cristela Blue, RN Outcome: Progressing 06/12/2020 1619 by Cristela Blue, RN Outcome: Progressing

## 2020-06-12 NOTE — Progress Notes (Signed)
PROGRESS NOTE    Katie [name]  [id number] DOB: July 07, 1955 DOA: 04/30/2020 PCP: Patient, [name]   Brief Narrative: Taken from prior notes. Katie [name] a 65 y.o.female with a history of diabetes mellitus type 2, stage IV CKD, heart failure, hypertension. Patient presented secondary to dyspnea with evidence of volume overload and pleural effusion.  Patient has had an extended hospital course. Her acute medical issues have essentially resolved at this point. She is medically stable for discharge however we do not have a safe disposition plan. Patient does not have stable living and lives with a friend here. She states that the friend likely will not be able to care for her.  TOC working to find placement however multiple facilities have refused at this point. Senior case management involved. Expanding search.  Patient remains stable.Continues to be very unmotivated always having pain complaints when PT attempted to work with her however then later complains of buttock pain from sitting too much. Been reinforce the need for exertion and ambulation daily basis.  Pt also very orthostatic and having orthostatics checked daily.remained positive for orthostasis despite starting on midodrine and TED hose, will try abdominal binder, most likely autonomic dysfunction.   Subjective: Patient was lying in her bed when seen today.  No new complaint.  She was asking for help to use party.  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)   Hyperkalemia   Pressure injury of skin   Pleural effusion   Acute heart failure with preserved ejection fraction (HFpEF) (HCC)   Chronic kidney disease (CKD), stage IV (severe) ([place])   Pulmonary hypertension, unspecified (HCC)   ATN (acute tubular necrosis) (HCC)  HFpEF.  Echo  done in December 2021 with normal EF and grade 2 diastolic dysfunction.Associated RV systolic dysfunction with elevated PA pressure seen on TTE although RHC on 12/28 significant for mildly elevated right heart/pulmonary artery pressures.    She was initially admitted with volume overload.  Initially managed with IV Lasix, progress to ESRD requiring dialysis, currently volume is being managed with dialysis.  Appears euvolemic. Difficult disposition due to unsafe discharge, apparently she was like living with a friend who cannot take care of her anymore.  TOC is working for places accepting facility. -Continue with dialysis. -Continue with torsemide.  Pleural effusion In setting of fluid overload with new diagnosis of grade 2 diastolic dysfunction  Echo 05/01/20 with nml EF S/p diagnostic Rt thoracentesis performed on 12/20 No evidence of maligancy on cytology  Diet controlled diabetes mellitus, type II.  CBG well controlled and A1c of 4.7 which is against the diagnosis of diabetes. -Currently diet controlled -Continue to monitor.  Orthostatic hypotension.  Patient remained significantly orthostatic positive, most likely autonomic dysfunction.  Baseline blood pressure mildly elevated today. -Continue with midodrine -Continue with TED hose -Can try abdominal binder -Continue to monitor  ESRD-MWF- Unknown baseline. 2/2 HTN and atherosclerosis Hx/o NSADS Patient is unsure of her primary nephrologist in [place].  Creatinine of 2.97 on admission.  Per patient, she was planned to start hemodialysis in addition to consideration for renal transplant.  Nephrology consulted on 12/20.  Worsening renal function in setting of attempted Lasix diuresis for heart failure.  She was started on dialysis by nephrology. Patient on midodrine 10 mg on dialysis days due to orthostatic hypotension  which she becomes very symptomatic Midodrine 5 mg p.o. 3 times daily started on on 1/22 besides the 10 mg given  on dialysis day Expected [place] Pending financial clearance for discharge planning TOC attempting disposition to SNF Nephrology following.  Moderate pericardial effusion No clinical evidence of tamponade.  No recent infections.  In setting of CKD V with elevated BUN. No chest pain. -cardiology rec. Outpatient Echo for surveillance Hemodynamics stable , continue to monitor  Mild/moderate aortic stenosis Unsure if this is symptomatic.  Patient does have dyspnea with exertion but this may be multifactorial. -Cardiology recommendations: outpatient Transthoracic Echocardiogram for surveillance.  Essential hypertension.  Blood pressure mildly elevated but she has significant drop with change in position.  On multiple meds which include carvedilol, losartan, torsemide along with midodrine to see if that will help with her positive orthostatic vitals. Patient needs midodrine during dialysis. -Continue current management and monitor  Anemia Possible related to underlying kidney disease.  Evidence of macrocytosis on CBC.  TIBC low but otherwise normal iron panel. Vitamin B12 level 358, target > 400, started oral supplement and folate normal.  Pressure injury Mid coccyx, POA  History of tobacco use Patient has quit for at least 10 years. Previously, 20 pack year history.  Headache Normal chronic headache. Patient states she takes Imitrex as needed.  Continue Tylenol and oxycodone as needed 1/8 s/p Fioricet 1 dose 1/9 IV Solu-Medrol 60 mg x 1 dose, Benadryl 25 mg IV x1 dose and Phenergan 25 mg IV x1 dose given  Insomnia, started trazodone and melatonin Continue melatonin and trazodone nightly,seems to be helping patient to sleep  Obesity Body mass index is 30.91 kg/m.  Objective: Vitals:   06/11/20 2353 06/12/20 0618 06/12/20 0803 06/12/20 1117  BP: (!) 109/52 (!) 146/60 (!) 179/72 (!) 161/75  Pulse: 65 64 66 69  Resp: '16  18 18  '$ Temp: 97.9 F (36.6 C)  98.1 F (36.7 C) 98.6 F (37 C) 98.8 F (37.1 C)  TempSrc: Oral Oral Oral   SpO2: 97% 96% 98% 99%  Weight:      Height:        Intake/Output Summary (Last 24 hours) at 06/12/2020 1413 Last data filed at 06/12/2020 1015 Gross per 24 hour  Intake 480 ml  Output --  Net 480 ml   Filed Weights   06/08/20 0527 06/10/20 0500 06/11/20 0658  Weight: 77.7 kg 77.9 kg 76.7 kg    Examination:  General.  Well-developed lady, in no acute distress. Pulmonary.  Lungs clear bilaterally, normal respiratory effort. CV.  Regular rate and rhythm, no JVD, rub or murmur. Abdomen.  Soft, nontender, nondistended, BS positive. CNS.  Alert and oriented x3.  No focal neurologic deficit. Extremities.  No edema, no cyanosis, pulses intact and symmetrical. Psychiatry.  Judgment and insight appears normal.  DVT prophylaxis: Heparin Code Status: Full Family Communication: Discussed with patient Disposition Plan:  Status is: Inpatient  Remains inpatient appropriate because:Unsafe d/c plan   Dispo: The patient is from: Home              Anticipated d/c is to: SNF              Anticipated d/c date is: 1 day              Patient currently is medically stable to d/c.   Difficult to place patient Yes              Level of care: Med-Surg  Consultants:  Nephrology  Procedures:  Antimicrobials:   Data Reviewed: I have personally reviewed following labs and imaging studies  CBC: Recent Labs  Lab 06/06/20 1224 06/11/20 1023  WBC 6.6 6.7  NEUTROABS 4.2  --   HGB 8.5* 8.9*  HCT 26.0* 27.8*  MCV 95.9 96.9  PLT 253 [id number]   Basic Metabolic Panel: Recent Labs  Lab 06/06/20 1224 06/11/20 1023  NA 139 135  K 3.5 4.2  CL 103 100  CO2 31 26  GLUCOSE 144* 134*  BUN 24* 45*  CREATININE 1.40* 2.79*  CALCIUM 8.0* 8.5*  PHOS 3.2 5.2*   GFR: Estimated Creatinine Clearance: 21.9 mL/min (A) (by C-G formula based on SCr of 2.79 mg/dL (H)). Liver Function Tests: Recent Labs  Lab 06/06/20 1224   ALBUMIN 2.3*   No results for input(s): LIPASE, AMYLASE in the last 168 hours. No results for input(s): AMMONIA in the last 168 hours. Coagulation Profile: No results for input(s): INR, PROTIME in the last 168 hours. Cardiac Enzymes: No results for input(s): CKTOTAL, CKMB, CKMBINDEX, TROPONINI in the last 168 hours. BNP (last 3 results) No results for input(s): PROBNP in the last 8760 hours. HbA1C: No results for input(s): HGBA1C in the last 72 hours. CBG: No results for input(s): GLUCAP in the last 168 hours. Lipid Profile: No results for input(s): CHOL, HDL, LDLCALC, TRIG, CHOLHDL, LDLDIRECT in the last 72 hours. Thyroid Function Tests: No results for input(s): TSH, T4TOTAL, FREET4, T3FREE, THYROIDAB in the last 72 hours. Anemia Panel: No results for input(s): VITAMINB12, FOLATE, FERRITIN, TIBC, IRON, RETICCTPCT in the last 72 hours. Sepsis Labs: No results for input(s): PROCALCITON, LATICACIDVEN in the last 168 hours.  No results found for this or any previous visit (from the past 240 hour(s)).   Radiology Studies: No results found.  Scheduled Meds: . benzonatate  200 mg Oral TID  . carvedilol  12.5 mg Oral BID WC  . Chlorhexidine Gluconate Cloth  6 each Topical Q0600  . cholecalciferol  1,000 Units Oral Daily  . epoetin (EPOGEN/PROCRIT) injection  10,000 Units Intravenous Q M,W,F-HD  . feeding supplement  237 mL Oral BID BM  . heparin  5,000 Units Subcutaneous Q8H  . lidocaine  1 patch Transdermal Q24H  . losartan  75 mg Oral Daily  . melatonin  5 mg Oral QPM  . methocarbamol  500 mg Oral QID  . midodrine  5 mg Oral TID WC  . multivitamin  1 tablet Oral QHS  . polyethylene glycol  17 g Oral Daily  . senna-docusate  1 tablet Oral BID  . sevelamer carbonate  800 mg Oral TID WC  . sodium chloride flush  3 mL Intravenous Q12H  . topiramate  25 mg Oral BID  . torsemide  40 mg Oral Daily  . traZODone  50 mg Oral QPM  . vitamin B-12  100 mcg Oral Daily   Continuous  Infusions: . sodium chloride       LOS: 42 days   Time spent: 20 minutes.  Lorella Nimrod, MD [facility]  If 7PM-7AM, please contact night-coverage [email or web address]  06/12/2020, 2:13 PM   This record has been created using Systems analyst. Errors have been sought and corrected,but may not always be located. Such creation errors do not reflect on the standard of care.

## 2020-06-12 NOTE — Progress Notes (Signed)
Katie [name]  MRN: [id number]  DOB/AGE: 1956/01/27 65 y.o.  Primary Care Physician:Patient, [name]  Admit date: 04/30/2020  Chief Complaint:  Chief Complaint  Patient presents with  . Weakness    S-Pt presented on  04/30/2020 with  Chief Complaint  Patient presents with  . Weakness  .    Pt today feels better .  Medications . benzonatate  200 mg Oral TID  . carvedilol  12.5 mg Oral BID WC  . Chlorhexidine Gluconate Cloth  6 each Topical Q0600  . cholecalciferol  1,000 Units Oral Daily  . epoetin (EPOGEN/PROCRIT) injection  10,000 Units Intravenous Q M,W,F-HD  . feeding supplement  237 mL Oral BID BM  . heparin  5,000 Units Subcutaneous Q8H  . lidocaine  1 patch Transdermal Q24H  . losartan  75 mg Oral Daily  . melatonin  5 mg Oral QPM  . methocarbamol  500 mg Oral QID  . midodrine  5 mg Oral TID WC  . multivitamin  1 tablet Oral QHS  . polyethylene glycol  17 g Oral Daily  . senna-docusate  1 tablet Oral BID  . sevelamer carbonate  800 mg Oral TID WC  . sodium chloride flush  3 mL Intravenous Q12H  . topiramate  25 mg Oral BID  . torsemide  40 mg Oral Daily  . traZODone  50 mg Oral QPM  . vitamin B-12  100 mcg Oral Daily         [id number] from the symptoms mentioned above,there are no other symptoms referable to all systems reviewed.  Physical Exam: Vital signs in last 24 hours: Temp:  [97.9 F (36.6 C)-98.8 F (37.1 C)] 98.8 F (37.1 C) (01/30 1117) Pulse Rate:  [64-69] 69 (01/30 1117) Resp:  [16-18] 18 (01/30 1117) BP: (109-179)/(52-75) 161/75 (01/30 1117) SpO2:  [94 %-99 %] 99 % (01/30 1117) Weight change:  Last BM Date: 06/09/20  Intake/Output from previous day: 01/29 0701 - 01/30 0700 In: 240 [P.O.:240] Out: -  Total I/O In: 240 [P.O.:240] Out: -    Physical Exam:  General- pt is awake,alert, oriented to time place and person  Resp- No acute REsp distress, CTA B/L NO Rhonchi  CVS- S1S2 regular in rate and rhythm  GIT- BS+,  soft, Non tender , Non distended  EXT- No LE Edema,  No Cyanosis  Access-tunneled cath   Lab Results:  CBC  Recent Labs    06/11/20 1023  WBC 6.7  HGB 8.9*  HCT 27.8*  PLT 270    BMET  Recent Labs    06/11/20 1023  NA 135  K 4.2  CL 100  CO2 26  GLUCOSE 134*  BUN 45*  CREATININE 2.79*  CALCIUM 8.5*      Most recent Creatinine trend  Lab Results  Component Value Date   CREATININE 2.79 (H) 06/11/2020   CREATININE 1.40 (H) 06/06/2020   CREATININE 1.48 (H) 06/03/2020      MICRO   No results found for this or any previous visit (from the past 240 hour(s)).       Impression:  Katie [name] is a 65 y.o.  Caucasian female with past medical history of diastolic congestive heart failure, hypertension, diabetes mellitus type II, CVA who is admitted to [facility] on 04/30/2020 for Chronic kidney disease (CKD), stage IV (severe) ([place]) [N18.4] Volume overload [E87.70]  Patient moved from [place] in December 2021. She was following with nephrology there. Patient initiated on hemodialysis this admission. Patient has a  prolonged hospitalization. She was initially admitted with volume overload and acute exacerbation of congestive heart failure. Failed IV diuretics and started on hemodialysis on 12/29. No outpatient dialysis due to patient's lack of insurance and not a resident of the state of [place]. Held dialysis for possible recovery however after several days, creatinine rising and volume accumulation. Patient then resumed on dialysis.  Patient is now requiring renal replacement therapy twice a week   1)Renal   ESRD Patient is on dialysis on Mondays and Fridays Patient was last dialyzed on Friday No need for renal placement therapy today We will dialyze on Monday  2) hypertension and orthostatic hypotension Patient is on midodrine Blood pressure is stable for her state   3)Anemia of chronic disease  CBC Latest Ref Rng & Units 06/11/2020 06/06/2020  05/26/2020  WBC 4.0 - 10.5 K/uL 6.7 6.6 6.9  Hemoglobin 12.0 - 15.0 g/dL 8.9(L) 8.5(L) 7.8(L)  Hematocrit 36.0 - 46.0 % 27.8(L) 26.0(L) 24.9(L)  Platelets 150 - 400 K/uL 270 253 58       HGb is not at goal (9--11) Patient is on Epogen protocol  4) Secondary hyperparathyroidism -CKD Mineral-Bone Disorder    Lab Results  Component Value Date   PTH 70 (H) 05/12/2020   CALCIUM 8.5 (L) 06/11/2020   PHOS 5.2 (H) 06/11/2020    Secondary Hyperparathyroidism present Phosphorus at goal.   5) diabetes mellitus Being followed by the primary team   6) Electrolytes   BMP Latest Ref Rng & Units 06/11/2020 06/06/2020 06/03/2020  Glucose 70 - 99 mg/dL 134(H) 144(H) 126(H)  BUN 8 - 23 mg/dL 45(H) 24(H) 19  Creatinine 0.44 - 1.00 mg/dL 2.79(H) 1.40(H) 1.48(H)  Sodium 135 - 145 mmol/L 135 139 139  Potassium 3.5 - 5.1 mmol/L 4.2 3.5 3.8  Chloride 98 - 111 mmol/L 100 103 102  CO2 22 - 32 mmol/L '26 31 29  '$ Calcium 8.9 - 10.3 mg/dL 8.5(L) 8.0(L) 7.9(L)     Sodium Normonatremic   Potassium Normokalemic    7)Acid base Co2 at goal     Plan:  No need for renal placement therapy today We will dialyze in the morning Will keep on epogen     Katie [name] s Katie [name] 06/12/2020, 12:27 PM

## 2020-06-13 MED ORDER — BENZONATATE 100 MG PO CAPS: 200.0000 mg | ORAL_CAPSULE | Freq: Two times a day (BID) | ORAL | Status: DC | PRN

## 2020-06-13 MED ORDER — LOSARTAN POTASSIUM 50 MG PO TABS
100.0000 mg | ORAL_TABLET | Freq: Every day | ORAL | Status: DC
  Administered 2020-06-13: 100 mg via ORAL
  Filled 2020-06-13: qty 2

## 2020-06-13 MED ORDER — LOSARTAN POTASSIUM 50 MG PO TABS: 100.0000 mg | ORAL_TABLET | Freq: Every day | ORAL | Status: DC

## 2020-06-13 NOTE — Progress Notes (Signed)
Occupational Therapy Treatment Patient Details Name: Katie [name] MRN: [id number] DOB: 1956/02/01 Today's Date: 06/13/2020    History of present illness Patient is a 65 y.o. female with a history of diabetes mellitus type 2, stage IV CKD, heart failure, hypertension. Patient presented secondary to dyspnea with evidence of volume overload and pleural effusion. Diagnosed with moderate pericardial effusion, mild to moderate aortic insufficiency and aortic stenosis.  S/p R heart cath 12/28.  R temp fem cath placed 12/29; 1/3 R IJ permcath placed; 1/4 R temp fem cath removed.   OT comments  Katie [name] participated actively in OT today, showing good effort and motivation. She was able to sit EOB and engage in grooming, bathing, and therex tasks, reaching outside BOS, w/ no LOB. Pt complained of slight dizziness immediately after transition from supine<sit, but this resolved w/in ~ 1 minute. Using RW but no physical assist, pt also able to stand and weight shift for several minutes w/ RW and only mild dizziness, quickly resolved. Pt stated she had "solid" 7/10 pain today and that she was becoming increasingly frustrated with her health situation, esp with her inability to get out of bed. By end of session, pt expressed feeling much relieved and very pleased with her ability to sit up for so long w/o dizziness and even to stand for longer than she has been able to in previous weeks. Recommend continued OT while pt is hospitalized, and STR to allow Katie [name] to regain strength, endurance, orthostatic control, and return to PLOF.   Follow Up Recommendations  SNF;Supervision - Intermittent    Equipment Recommendations       Recommendations for Other Services      Precautions / Restrictions Precautions Precautions: Fall Precaution Comments: R IJ permcath; monitor orthostatics; ck puls oximetry when ambulating Restrictions Weight Bearing Restrictions: No Other Position/Activity Restrictions: Pt  Orthostatic, monitor BP with change of position       Mobility Bed Mobility Overal bed mobility: Modified Independent Bed Mobility: Supine to Sit;Sit to Supine     Supine to sit: Supervision;HOB elevated Sit to supine: Supervision;HOB elevated   General bed mobility comments: Extra time and effort required  Transfers Overall transfer level: Needs assistance Equipment used: Rolling walker (2 wheeled) Transfers: Sit to/from Stand Sit to Stand: Min guard         General transfer comment: Extra time/effort to stand, but no physical assistance required    Balance Overall balance assessment: Needs assistance Sitting-balance support: No upper extremity supported;Feet supported Sitting balance-Leahy Scale: Good Sitting balance - Comments: able to reach outside of BOS while sitting EOB, no LOB   Standing balance support: Bilateral upper extremity supported Standing balance-Leahy Scale: Fair Standing balance comment: with RW and close SUPV for safety, but pt able to stand and shift weight w/ no LOB and no dizzyness. Pt reports feeling tired/weak in legs after ~ 3 minutes standing.                           ADL either performed or assessed with clinical judgement   ADL Overall ADL's : Needs assistance/impaired Eating/Feeding: Set up;Supervision/ safety   Grooming: Wash/dry face;Oral care;Brushing hair;Sitting;Supervision/safety;Set up;Wash/dry hands Grooming Details (indicate cue type and reason): shampooing Upper Body Bathing: Supervision/ safety;Set up;Sitting                             General ADL Comments: Pt able to engage in  shampooing, grooming, UB bathing with SUPV, sitting EOB     Vision Baseline Vision/History: Wears glasses;Retinopathy Wears Glasses: At all times Patient Visual Report: Blurring of vision Additional Comments: Pt reports vision has become more blurry during her hospital stay and she is now unable to read   Perception      Praxis      Cognition Arousal/Alertness: Awake/alert Behavior During Therapy: WFL for tasks assessed/performed Overall Cognitive Status: Within Functional Limits for tasks assessed                                 General Comments: Pt is A and O x 4. cooperative and pleasant throughout        Exercises Total Joint Exercises Ankle Circles/Pumps: AROM;Both;10 reps;Strengthening;Seated Hip ABduction/ADduction: AROM;10 reps;Both;Strengthening Straight Leg Raises: AROM;Both;10 reps;Strengthening Knee Flexion: AROM;Strengthening;Both;10 reps Marching in Standing: Seated;AROM;Strengthening;15 reps;Both Other Exercises Other Exercises: educ re: HEP, falls prevention, EC   Shoulder Instructions       General Comments Orthostatics: supine: 177/75; sitting 140/84; standing 112/70; sitting following therex and seated ADL: 122/78    Pertinent Vitals/ Pain       Pain Score: 7  Pain Location: headache, back & neck pain Pain Intervention(s): Monitored during session;Repositioned;Relaxation;Limited activity within patient's tolerance  Home Living                                          Prior Functioning/Environment              Frequency  Min 1X/week        Progress Toward Goals  OT Goals(current goals can now be found in the care plan section)  Progress towards OT goals: Progressing toward goals  Acute Rehab OT Goals Patient Stated Goal: to go home OT Goal Formulation: With patient Time For Goal Achievement: 06/22/20 Potential to Achieve Goals: Good  Plan Discharge plan remains appropriate;Frequency remains appropriate    Co-evaluation                 AM-PAC OT "6 Clicks" Daily Activity     Outcome Measure   Help from another person eating meals?: None Help from another person taking care of personal grooming?: A Little Help from another person toileting, which includes using toliet, bedpan, or urinal?: A Lot Help from  another person bathing (including washing, rinsing, drying)?: A Lot Help from another person to put on and taking off regular upper body clothing?: A Little Help from another person to put on and taking off regular lower body clothing?: A Lot 6 Click Score: 16    End of Session Equipment Utilized During Treatment: Rolling walker  OT Visit Diagnosis: Unsteadiness on feet (R26.81);History of falling (Z91.81);Pain;Muscle weakness (generalized) (M62.81)   Activity Tolerance Patient tolerated treatment well   Patient Left in bed;with call bell/phone within reach;with bed alarm set   Nurse Communication          Time: [id number] OT Time Calculation (min): 58 min  Charges: OT General Charges $OT Visit: 1 Visit OT Treatments $Self Care/Home Management : 23-37 mins $Therapeutic Activity: 8-22 mins $Therapeutic Exercise: 8-22 mins  Josiah Lobo, PhD, [place], OTR/L ascom [phone] 06/13/20, 4:26 PM

## 2020-06-13 NOTE — Plan of Care (Signed)
Problem: Education: Goal: Ability to demonstrate management of disease process will improve 06/13/2020 1232 by Cristela Blue, RN Outcome: Progressing 06/13/2020 1232 by Cristela Blue, RN Outcome: Progressing Goal: Ability to verbalize understanding of medication therapies will improve 06/13/2020 1232 by Cristela Blue, RN Outcome: Progressing 06/13/2020 1232 by Cristela Blue, RN Outcome: Progressing Goal: Individualized Educational Video(s) 06/13/2020 1232 by Cristela Blue, RN Outcome: Progressing 06/13/2020 1232 by Cristela Blue, RN Outcome: Progressing   Problem: Activity: Goal: Capacity to carry out activities will improve 06/13/2020 1232 by Cristela Blue, RN Outcome: Progressing 06/13/2020 1232 by Cristela Blue, RN Outcome: Progressing   Problem: Cardiac: Goal: Ability to achieve and maintain adequate cardiopulmonary perfusion will improve 06/13/2020 1232 by Cristela Blue, RN Outcome: Progressing 06/13/2020 1232 by Cristela Blue, RN Outcome: Progressing   Problem: Education: Goal: Knowledge of General Education information will improve Description: Including pain rating scale, medication(s)/side effects and non-pharmacologic comfort measures 06/13/2020 1232 by Cristela Blue, RN Outcome: Progressing 06/13/2020 1232 by Cristela Blue, RN Outcome: Progressing   Problem: Health Behavior/Discharge Planning: Goal: Ability to manage health-related needs will improve 06/13/2020 1232 by Cristela Blue, RN Outcome: Progressing 06/13/2020 1232 by Cristela Blue, RN Outcome: Progressing   Problem: Clinical Measurements: Goal: Ability to maintain clinical measurements within normal limits will improve 06/13/2020 1232 by Cristela Blue, RN Outcome: Progressing 06/13/2020 1232 by Cristela Blue, RN Outcome: Progressing Goal: Will remain free from infection 06/13/2020 1232 by Cristela Blue, RN Outcome: Progressing 06/13/2020 1232 by Cristela Blue, RN Outcome:  Progressing Goal: Diagnostic test results will improve 06/13/2020 1232 by Cristela Blue, RN Outcome: Progressing 06/13/2020 1232 by Cristela Blue, RN Outcome: Progressing Goal: Respiratory complications will improve 06/13/2020 1232 by Cristela Blue, RN Outcome: Progressing 06/13/2020 1232 by Cristela Blue, RN Outcome: Progressing Goal: Cardiovascular complication will be avoided 06/13/2020 1232 by Cristela Blue, RN Outcome: Progressing 06/13/2020 1232 by Cristela Blue, RN Outcome: Progressing   Problem: Activity: Goal: Risk for activity intolerance will decrease 06/13/2020 1232 by Cristela Blue, RN Outcome: Progressing 06/13/2020 1232 by Cristela Blue, RN Outcome: Progressing   Problem: Nutrition: Goal: Adequate nutrition will be maintained 06/13/2020 1232 by Cristela Blue, RN Outcome: Progressing 06/13/2020 1232 by Cristela Blue, RN Outcome: Progressing   Problem: Coping: Goal: Level of anxiety will decrease 06/13/2020 1232 by Cristela Blue, RN Outcome: Progressing 06/13/2020 1232 by Cristela Blue, RN Outcome: Progressing   Problem: Elimination: Goal: Will not experience complications related to bowel motility 06/13/2020 1232 by Cristela Blue, RN Outcome: Progressing 06/13/2020 1232 by Cristela Blue, RN Outcome: Progressing Goal: Will not experience complications related to urinary retention 06/13/2020 1232 by Cristela Blue, RN Outcome: Progressing 06/13/2020 1232 by Cristela Blue, RN Outcome: Progressing   Problem: Pain Managment: Goal: General experience of comfort will improve 06/13/2020 1232 by Cristela Blue, RN Outcome: Progressing 06/13/2020 1232 by Cristela Blue, RN Outcome: Progressing   Problem: Safety: Goal: Ability to remain free from injury will improve 06/13/2020 1232 by Cristela Blue, RN Outcome: Progressing 06/13/2020 1232 by Cristela Blue, RN Outcome: Progressing   Problem: Skin Integrity: Goal: Risk for impaired skin integrity will  decrease 06/13/2020 1232 by Cristela Blue, RN Outcome: Progressing 06/13/2020 1232 by Cristela Blue, RN Outcome: Progressing   Problem: Education: Goal: Knowledge of disease and its progression will improve 06/13/2020 1232 by Cristela Blue, RN Outcome: Progressing 06/13/2020 1232 by Cristela Blue, RN Outcome: Progressing Goal: Individualized Educational Video(s) 06/13/2020 1232 by Cristela Blue, RN Outcome: Progressing 06/13/2020 1232 by Cristela Blue, RN Outcome: Progressing   Problem: Fluid Volume: Goal: Compliance with measures  to maintain balanced fluid volume will improve 06/13/2020 1232 by Cristela Blue, RN Outcome: Progressing 06/13/2020 1232 by Cristela Blue, RN Outcome: Progressing   Problem: Health Behavior/Discharge Planning: Goal: Ability to manage health-related needs will improve 06/13/2020 1232 by Cristela Blue, RN Outcome: Progressing 06/13/2020 1232 by Cristela Blue, RN Outcome: Progressing   Problem: Nutritional: Goal: Ability to make healthy dietary choices will improve 06/13/2020 1232 by Cristela Blue, RN Outcome: Progressing 06/13/2020 1232 by Cristela Blue, RN Outcome: Progressing   Problem: Clinical Measurements: Goal: Complications related to the disease process, condition or treatment will be avoided or minimized 06/13/2020 1232 by Cristela Blue, RN Outcome: Progressing 06/13/2020 1232 by Cristela Blue, RN Outcome: Progressing

## 2020-06-13 NOTE — Progress Notes (Signed)
Pt back in room from dialysis. Was in dialysis for 3 hrs per dialysis nurse. Pt c/o headache Gave prn medication and placed warm compress on head with lights out. BP is elevated some. Pt was offered  meal try pt did not want said her head hurt to much to eat. Call light within reach. Will continue to monitor.

## 2020-06-13 NOTE — Progress Notes (Signed)
PROGRESS NOTE    Katie [name]  [id number] DOB: 1955-06-01 DOA: 04/30/2020 PCP: Patient, [name]   Brief Narrative: Taken from prior notes. Katie [name] a 65 y.o.female with a history of diabetes mellitus type 2, stage IV CKD, heart failure, hypertension. Patient presented secondary to dyspnea with evidence of volume overload and pleural effusion.  Patient has had an extended hospital course. Her acute medical issues have essentially resolved at this point. She is medically stable for discharge however we do not have a safe disposition plan. Patient does not have stable living and lives with a friend here. She states that the friend likely will not be able to care for her.  TOC working to find placement however multiple facilities have refused at this point. Senior case management involved. Expanding search.  Patient remains stable.Continues to be very unmotivated always having pain complaints when PT attempted to work with her however then later complains of buttock pain from sitting too much. Been reinforce the need for exertion and ambulation daily basis.  Pt also very orthostatic and having orthostatics checked daily.remained positive for orthostasis despite starting on midodrine and TED hose, will try abdominal binder, most likely autonomic dysfunction.   Subjective: Patient was lying comfortably when seen today.  We discussed regarding participating with PT very actively so she can be discharged to SNF. Patient was under the impression that she needs at least 2 person for her severe orthostasis.  Discussed that she has to take it very slow and one person can help in case if she becomes more dizzy or symptomatic.  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)   Hyperkalemia   Pressure injury of  skin   Pleural effusion   Acute heart failure with preserved ejection fraction (HFpEF) (HCC)   Chronic kidney disease (CKD), stage IV (severe) ([place])   Pulmonary hypertension, unspecified (HCC)   ATN (acute tubular necrosis) (HCC)  HFpEF.  Echo done in December 2021 with normal EF and grade 2 diastolic dysfunction.Associated RV systolic dysfunction with elevated PA pressure seen on TTE although RHC on 12/28 significant for mildly elevated right heart/pulmonary artery pressures.    She was initially admitted with volume overload.  Initially managed with IV Lasix, progress to ESRD requiring dialysis, currently volume is being managed with dialysis.  Appears euvolemic. Difficult disposition due to unsafe discharge, apparently she was like living with a friend who cannot take care of her anymore.  TOC is working for places accepting facility. -Continue with dialysis. -Continue with torsemide.  Pleural effusion In setting of fluid overload with new diagnosis of grade 2 diastolic dysfunction  Echo 05/01/20 with nml EF S/p diagnostic Rt thoracentesis performed on 12/20 No evidence of maligancy on cytology  Diet controlled diabetes mellitus, type II.  CBG well controlled and A1c of 4.7 which is against the diagnosis of diabetes. -Currently diet controlled -Continue to monitor.  Orthostatic hypotension.  Patient remained significantly orthostatic positive, most likely autonomic dysfunction.  Baseline blood pressure mildly elevated today. -Continue with midodrine -Continue with TED hose -Can try abdominal binder -Continue to monitor  ESRD-MWF- Unknown baseline. 2/2 HTN and atherosclerosis Hx/o NSADS Patient is unsure of her primary nephrologist in [place].  Creatinine of 2.97 on admission.  Per patient, she was planned to start hemodialysis in addition to consideration  for renal transplant.  Nephrology consulted on 12/20.  Worsening renal function in setting of attempted Lasix diuresis  for heart failure.  She was started on dialysis by nephrology. Patient on midodrine 10 mg on dialysis days due to orthostatic hypotension which she becomes very symptomatic Midodrine 5 mg p.o. 3 times daily started on on 1/22 besides the 10 mg given on dialysis day Expected [place] Pending financial clearance for discharge planning TOC attempting disposition to SNF Nephrology following.  Moderate pericardial effusion No clinical evidence of tamponade.  No recent infections.  In setting of CKD V with elevated BUN. No chest pain. -cardiology rec. Outpatient Echo for surveillance Hemodynamics stable , continue to monitor  Mild/moderate aortic stenosis Unsure if this is symptomatic.  Patient does have dyspnea with exertion but this may be multifactorial. -Cardiology recommendations: outpatient Transthoracic Echocardiogram for surveillance.  Essential hypertension.  Blood pressure elevated but she has significant drop with change in position.  On multiple meds which include carvedilol, losartan, torsemide along with midodrine to see if that will help with her positive orthostatic vitals. Patient needs midodrine during dialysis. -Increase the dose of losartan from 75 mg to 100 mg daily. -Continue rest of the medications and monitor.  Anemia Possible related to underlying kidney disease.  Evidence of macrocytosis on CBC.  TIBC low but otherwise normal iron panel. Vitamin B12 level 358, target > 400, started oral supplement and folate normal.  Pressure injury Mid coccyx, POA  History of tobacco use Patient has quit for at least 10 years. Previously, 20 pack year history.  Headache Normal chronic headache. Patient states she takes Imitrex as needed.  Continue Tylenol and oxycodone as needed 1/8 s/p Fioricet 1 dose 1/9 IV Solu-Medrol 60 mg x 1 dose, Benadryl 25 mg IV x1 dose and Phenergan 25 mg IV x1 dose given  Insomnia, started trazodone and melatonin Continue  melatonin and trazodone nightly,seems to be helping patient to sleep  Obesity Body mass index is 30.91 kg/m.  Objective: Vitals:   06/12/20 2346 06/13/20 0433 06/13/20 0721 06/13/20 1103  BP: (!) 147/62 140/60 (!) 178/67 139/64  Pulse: 67 66 70 68  Resp:  '16 17 16  '$ Temp: 98 F (36.7 C) 98.4 F (36.9 C) 99.1 F (37.3 C) 98.4 F (36.9 C)  TempSrc: Oral Oral Oral   SpO2: 98% 97% 96% 98%  Weight:      Height:        Intake/Output Summary (Last 24 hours) at 06/13/2020 1355 Last data filed at 06/12/2020 2348 Gross per 24 hour  Intake 120 ml  Output 550 ml  Net -430 ml   Filed Weights   06/08/20 0527 06/10/20 0500 06/11/20 0658  Weight: 77.7 kg 77.9 kg 76.7 kg    Examination:  General.  Well-developed lady, in no acute distress. Pulmonary.  Lungs clear bilaterally, normal respiratory effort. CV.  Regular rate and rhythm, no JVD, rub or murmur. Abdomen.  Soft, nontender, nondistended, BS positive. CNS.  Alert and oriented x3.  No focal neurologic deficit. Extremities.  No edema, no cyanosis, pulses intact and symmetrical. Psychiatry.  Judgment and insight appears normal.  DVT prophylaxis: Heparin Code Status: Full Family Communication: Discussed with patient Disposition Plan:  Status is: Inpatient  Remains inpatient appropriate because:Unsafe d/c plan   Dispo: The patient is from: Home              Anticipated d/c is to: SNF  Anticipated d/c date is: 1 day              Patient currently is medically stable to d/c.   Difficult to place patient Yes              Level of care: Med-Surg  Consultants:   Nephrology  Procedures:  Antimicrobials:   Data Reviewed: I have personally reviewed following labs and imaging studies  CBC: Recent Labs  Lab 06/11/20 1023  WBC 6.7  HGB 8.9*  HCT 27.8*  MCV 96.9  PLT [id number]   Basic Metabolic Panel: Recent Labs  Lab 06/11/20 1023  NA 135  K 4.2  CL 100  CO2 26  GLUCOSE 134*  BUN 45*  CREATININE  2.79*  CALCIUM 8.5*  PHOS 5.2*   GFR: Estimated Creatinine Clearance: 21.9 mL/min (A) (by C-G formula based on SCr of 2.79 mg/dL (H)). Liver Function Tests: No results for input(s): AST, ALT, ALKPHOS, BILITOT, PROT, ALBUMIN in the last 168 hours. No results for input(s): LIPASE, AMYLASE in the last 168 hours. No results for input(s): AMMONIA in the last 168 hours. Coagulation Profile: No results for input(s): INR, PROTIME in the last 168 hours. Cardiac Enzymes: No results for input(s): CKTOTAL, CKMB, CKMBINDEX, TROPONINI in the last 168 hours. BNP (last 3 results) No results for input(s): PROBNP in the last 8760 hours. HbA1C: No results for input(s): HGBA1C in the last 72 hours. CBG: No results for input(s): GLUCAP in the last 168 hours. Lipid Profile: No results for input(s): CHOL, HDL, LDLCALC, TRIG, CHOLHDL, LDLDIRECT in the last 72 hours. Thyroid Function Tests: No results for input(s): TSH, T4TOTAL, FREET4, T3FREE, THYROIDAB in the last 72 hours. Anemia Panel: No results for input(s): VITAMINB12, FOLATE, FERRITIN, TIBC, IRON, RETICCTPCT in the last 72 hours. Sepsis Labs: No results for input(s): PROCALCITON, LATICACIDVEN in the last 168 hours.  No results found for this or any previous visit (from the past 240 hour(s)).   Radiology Studies: No results found.  Scheduled Meds: . carvedilol  12.5 mg Oral BID WC  . Chlorhexidine Gluconate Cloth  6 each Topical Q0600  . cholecalciferol  1,000 Units Oral Daily  . epoetin (EPOGEN/PROCRIT) injection  10,000 Units Intravenous Q M,W,F-HD  . feeding supplement  237 mL Oral BID BM  . heparin  5,000 Units Subcutaneous Q8H  . lidocaine  1 patch Transdermal Q24H  . losartan  100 mg Oral Daily  . melatonin  5 mg Oral QPM  . methocarbamol  500 mg Oral QID  . midodrine  5 mg Oral TID WC  . multivitamin  1 tablet Oral QHS  . polyethylene glycol  17 g Oral Daily  . senna-docusate  1 tablet Oral BID  . sevelamer carbonate  800 mg  Oral TID WC  . sodium chloride flush  3 mL Intravenous Q12H  . topiramate  25 mg Oral BID  . torsemide  40 mg Oral Daily  . traZODone  50 mg Oral QPM  . vitamin B-12  100 mcg Oral Daily   Continuous Infusions: . sodium chloride       LOS: 43 days   Time spent: 25 minutes.  Lorella Nimrod, MD [facility]  If 7PM-7AM, please contact night-coverage [email or web address]  06/13/2020, 1:55 PM   This record has been created using Systems analyst. Errors have been sought and corrected,but may not always be located. Such creation errors do not reflect on the standard of care.

## 2020-06-13 NOTE — Progress Notes (Signed)
[facility]  ROUNDING NOTE   Subjective:   Working with occupational therapist Denies any acute complaints States he is able to eat some without nausea or vomiting  Objective:  Vital signs in last 24 hours:  Temp:  [98 F (36.7 C)-99.1 F (37.3 C)] 98.3 F (36.8 C) (01/31 1500) Pulse Rate:  [66-70] 70 (01/31 1500) Resp:  [16-17] 16 (01/31 1500) BP: (139-178)/(56-73) 148/73 (01/31 1500) SpO2:  [96 %-98 %] 97 % (01/31 1500)  Weight change:  Filed Weights   06/08/20 0527 06/10/20 0500 06/11/20 0658  Weight: 77.7 kg 77.9 kg 76.7 kg    Intake/Output: I/O last 3 completed shifts: In: 360 [P.O.:360] Out: 550 [Urine:550]   Intake/Output this shift:  Total I/O In: -  Out: 600 [Urine:600]  Physical Exam: General:  Sitting up on the side of the bed  HEENT  normocephalic, atraumatic  Pulm/lungs  clear bilateral, room air  CVS/Heart  regular  Abdomen:   Soft, nontender, nondistended  Extremities:  Trace to 1+, compression socks  Neurologic:  AAOx4  Skin:  No acute rashes, or lesions   Access: RIJ permcath 05/16/20 Dr. Lucky Cowboy    Basic Metabolic Panel: Recent Labs  Lab 06/11/20 1023  NA 135  K 4.2  CL 100  CO2 26  GLUCOSE 134*  BUN 45*  CREATININE 2.79*  CALCIUM 8.5*  PHOS 5.2*    Liver Function Tests: No results for input(s): AST, ALT, ALKPHOS, BILITOT, PROT, ALBUMIN in the last 168 hours. No results for input(s): LIPASE, AMYLASE in the last 168 hours. No results for input(s): AMMONIA in the last 168 hours.  CBC: Recent Labs  Lab 06/11/20 1023  WBC 6.7  HGB 8.9*  HCT 27.8*  MCV 96.9  PLT 270    Cardiac Enzymes: No results for input(s): CKTOTAL, CKMB, CKMBINDEX, TROPONINI in the last 168 hours.  BNP: Invalid input(s): POCBNP  CBG: No results for input(s): GLUCAP in the last 168 hours.  Microbiology: Results for orders placed or performed during the hospital encounter of 04/30/20  Resp Panel by RT-PCR (Flu A&B, Covid) Nasopharyngeal  Swab     Status: None   Collection Time: 04/30/20  4:01 PM   Specimen: Nasopharyngeal Swab; Nasopharyngeal(NP) swabs in vial transport medium  Result Value Ref Range Status   SARS Coronavirus 2 by RT PCR NEGATIVE NEGATIVE Final    Comment: (NOTE) SARS-CoV-2 target nucleic acids are NOT DETECTED.  The SARS-CoV-2 RNA is generally detectable in upper respiratory specimens during the acute phase of infection. The lowest concentration of SARS-CoV-2 viral copies this assay can detect is 138 copies/mL. A negative result does not preclude SARS-Cov-2 infection and should not be used as the sole basis for treatment or other patient management decisions. A negative result may occur with  improper specimen collection/handling, submission of specimen other than nasopharyngeal swab, presence of viral mutation(s) within the areas targeted by this assay, and inadequate number of viral copies(<138 copies/mL). A negative result must be combined with clinical observations, patient history, and epidemiological information. The expected result is Negative.  Fact Sheet for Patients:  EntrepreneurPulse.com.au  Fact Sheet for Healthcare Providers:  IncredibleEmployment.be  This test is no t yet approved or cleared by the [place] FDA and  has been authorized for detection and/or diagnosis of SARS-CoV-2 by FDA under an Emergency Use Authorization (EUA). This EUA will remain  in effect (meaning this test can be used) for the duration of the COVID-19 declaration under Section 564(b)(1) of the Act, 21  U.S.C.section 360bbb-3(b)(1), unless the authorization is terminated  or revoked sooner.       Influenza A by PCR NEGATIVE NEGATIVE Final   Influenza B by PCR NEGATIVE NEGATIVE Final    Comment: (NOTE) The Xpert Xpress SARS-CoV-2/FLU/RSV plus assay is intended as an aid in the diagnosis of influenza from Nasopharyngeal swab specimens and should not be used as a sole  basis for treatment. Nasal washings and aspirates are unacceptable for Xpert Xpress SARS-CoV-2/FLU/RSV testing.  Fact Sheet for Patients: EntrepreneurPulse.com.au  Fact Sheet for Healthcare Providers: IncredibleEmployment.be  This test is not yet approved or cleared by the [place] FDA and has been authorized for detection and/or diagnosis of SARS-CoV-2 by FDA under an Emergency Use Authorization (EUA). This EUA will remain in effect (meaning this test can be used) for the duration of the COVID-19 declaration under Section 564(b)(1) of the Act, 21 U.S.C. section 360bbb-3(b)(1), unless the authorization is terminated or revoked.  Performed at [facility], [address] [place], [place] [address]   Body fluid culture     Status: None   Collection Time: 05/02/20  4:44 PM   Specimen: PATH Cytology Pleural fluid  Result Value Ref Range Status   Specimen Description   Final    PLEURAL Performed at [facility], [address], [place], [place] [address]    Special Requests   Final    NONE Performed at [facility], [address] [place], [place] [address]    Gram Stain   Final    MODERATE WBC PRESENT, PREDOMINANTLY MONONUCLEAR NO ORGANISMS SEEN    Culture   Final    NO GROWTH Performed at [facility], [place], [place], [place] [address]    Report Status 05/06/2020 FINAL  Final  MRSA PCR Screening     Status: None   Collection Time: 05/16/20  9:29 PM   Specimen: Nasal Mucosa; Nasopharyngeal  Result Value Ref Range Status   MRSA by PCR NEGATIVE NEGATIVE Final    Comment:        The GeneXpert MRSA Assay (FDA approved for NASAL specimens only), is one component of a comprehensive MRSA colonization surveillance program. It is not intended to diagnose MRSA infection nor to guide or monitor treatment for MRSA infections. Performed at [facility], [address]  [place], [place] [address]     Coagulation Studies: No results for input(s): LABPROT, INR in the last 72 hours.  Urinalysis: No results for input(s): COLORURINE, LABSPEC, PHURINE, GLUCOSEU, HGBUR, BILIRUBINUR, KETONESUR, PROTEINUR, UROBILINOGEN, NITRITE, LEUKOCYTESUR in the last 72 hours.  Invalid input(s): APPERANCEUR    Imaging: No results found.   Medications:   . sodium chloride     . carvedilol  12.5 mg Oral BID WC  . Chlorhexidine Gluconate Cloth  6 each Topical Q0600  . cholecalciferol  1,000 Units Oral Daily  . epoetin (EPOGEN/PROCRIT) injection  10,000 Units Intravenous Q M,W,F-HD  . feeding supplement  237 mL Oral BID BM  . heparin  5,000 Units Subcutaneous Q8H  . lidocaine  1 patch Transdermal Q24H  . losartan  100 mg Oral Daily  . melatonin  5 mg Oral QPM  . methocarbamol  500 mg Oral QID  . midodrine  5 mg Oral TID WC  . multivitamin  1 tablet Oral QHS  . polyethylene glycol  17 g Oral Daily  . senna-docusate  1 tablet Oral BID  . sevelamer carbonate  800 mg Oral TID WC  .  sodium chloride flush  3 mL Intravenous Q12H  . topiramate  25 mg Oral BID  . torsemide  40 mg Oral Daily  . traZODone  50 mg Oral QPM  . vitamin B-12  100 mcg Oral Daily   sodium chloride, acetaminophen **OR** acetaminophen, ALPRAZolam, benzonatate, bisacodyl, chlorpheniramine-HYDROcodone, ondansetron **OR** ondansetron (ZOFRAN) IV, oxyCODONE-acetaminophen, promethazine, sodium chloride flush, SUMAtriptan  Assessment/ Plan:  [name] is a 65 y.o. white female with diastolic congestive heart failure, hypertension, diabetes mellitus type II, CVA who is admitted to [facility] on 04/30/2020 for Chronic kidney disease (CKD), stage IV (severe) ([place]) [N18.4] Volume overload [E87.70]  Patient moved from [place] in December 2021. She was following with nephrology there. Patient initiated on hemodialysis this admission. Patient has a prolonged hospitalization. She was initially admitted with volume  overload and acute exacerbation of congestive heart failure. Failed IV diuretics and started on hemodialysis on 12/29. No outpatient dialysis due to patient's lack of insurance and not a resident of the state of [place]. Held dialysis for possible recovery however after several days, creatinine rising and volume accumulation. Patient then resumed on dialysis. She has been weaned down to twice a week.   1. End stage renal disease  - Changed to twice a week dialysis, Mondays and Fridays. Seated in chair and tolerating treatment well.    2. Hypertension with lower extremity edema:  With autonomic orthostatics. Now off amlodipine - Current regimen of losartan, carvedilol, and torsemide.  -Blood pressure staying in the 140s now.  Will discontinue midodrine. -Change ARB losartan to bedtime  3. Anemia of chronic kidney disease:  Lab Results  Component Value Date   HGB 8.9 (L) 06/11/2020  EPO with HD treatments  4. Secondary hyperparathyroidism Lab Results  Component Value Date   PTH 70 (H) 05/12/2020   CALCIUM 8.5 (L) 06/11/2020   PHOS 5.2 (H) 06/11/2020  - sevelamer with meals    LOS: [place] 1/31/20224:50 PM

## 2020-06-14 MED ORDER — CLONIDINE HCL 0.1 MG PO TABS
0.1000 mg | ORAL_TABLET | Freq: Two times a day (BID) | ORAL | Status: DC
  Administered 2020-06-14 – 2020-06-18 (×6): 0.1 mg via ORAL
  Filled 2020-06-14 (×7): qty 1

## 2020-06-14 MED ORDER — LOSARTAN POTASSIUM 25 MG PO TABS: 25.0000 mg | ORAL_TABLET | Freq: Every day | ORAL | Status: DC

## 2020-06-14 MED ORDER — TORSEMIDE 20 MG PO TABS
10.0000 mg | ORAL_TABLET | Freq: Every day | ORAL | Status: DC
  Administered 2020-06-14 – 2020-07-02 (×18): 10 mg via ORAL
  Filled 2020-06-14 (×17): qty 1

## 2020-06-14 MED ORDER — MIDODRINE HCL 5 MG PO TABS
10.0000 mg | ORAL_TABLET | Freq: Three times a day (TID) | ORAL | Status: DC
  Administered 2020-06-14 – 2020-06-16 (×2): 10 mg via ORAL
  Filled 2020-06-14 (×5): qty 2

## 2020-06-14 NOTE — Progress Notes (Signed)
Physical Therapy Treatment Patient Details Name: Katie [name] MRN: [id number] DOB: 02/11/1956 Today's Date: 06/14/2020    History of Present Illness Patient is a 64 y.o. female with a history of diabetes mellitus type 2, stage IV CKD, heart failure, hypertension. Patient presented secondary to dyspnea with evidence of volume overload and pleural effusion. Diagnosed with moderate pericardial effusion, mild to moderate aortic insufficiency and aortic stenosis.  S/p R heart cath 12/28.  R temp fem cath placed 12/29; 1/3 R IJ permcath placed; 1/4 R temp fem cath removed.    PT Comments    Pt was pleasant and motivated to participate during the session but remained limited by orthostatic hypotension.  Supine BP 153/64, sitting 99/56 with very minimal symptoms, and standing 77/49 with dizziness/lightheadedness that forced patient to return to sitting.  Pt's symptoms resolved quickly in sitting with pt standing a second time after seated therex but needed to return to sitting before BP could be taken.  Pt declined to sit up in chair secondary to soreness after HD the day before.  Pt participated in session with both ted hose and abdominal binder donned.  Pt will benefit from PT services in a SNF setting upon discharge to safely address deficits listed in patient problem list for decreased caregiver assistance and eventual return to PLOF.      Follow Up Recommendations  SNF;Supervision for mobility/OOB     Equipment Recommendations  Wheelchair (measurements PT);Wheelchair cushion (measurements PT);Rolling walker with 5" wheels;3in1 (PT)    Recommendations for Other Services       Precautions / Restrictions Precautions Precautions: Fall Precaution Comments: R IJ permcath; monitor orthostatics Restrictions Other Position/Activity Restrictions: Pt Orthostatic, monitor BP with change of position    Mobility  Bed Mobility Overal bed mobility: Needs Assistance Bed Mobility: Supine to Sit;Sit to  Supine     Supine to sit: Min assist Sit to supine: Modified independent (Device/Increase time)   General bed mobility comments: Min A for trunk control during sup to sit  Transfers Overall transfer level: Needs assistance Equipment used: Rolling walker (2 wheeled) Transfers: Sit to/from Stand Sit to Stand: Min guard         General transfer comment: Extra time/effort to stand, but no physical assistance required  Ambulation/Gait Ambulation/Gait assistance: Min guard Gait Distance (Feet): 2 Feet Assistive device: Rolling walker (2 wheeled) Gait Pattern/deviations: Step-to pattern;Decreased step length - right;Decreased step length - left;Trunk flexed Gait velocity: decreased   General Gait Details: Pt able to take several small steps at the EOB before requiring to return to sitting secondary to dizziness   Stairs             Wheelchair Mobility    Modified Rankin (Stroke Patients Only)       Balance Overall balance assessment: Needs assistance Sitting-balance support: No upper extremity supported;Feet supported Sitting balance-Leahy Scale: Good     Standing balance support: Bilateral upper extremity supported Standing balance-Leahy Scale: Fair Standing balance comment: Min to mod lean on the RW for support                            Cognition Arousal/Alertness: Awake/alert Behavior During Therapy: WFL for tasks assessed/performed Overall Cognitive Status: Within Functional Limits for tasks assessed  Exercises Total Joint Exercises Ankle Circles/Pumps: AROM;Both;10 reps;Strengthening Quad Sets: Strengthening;Both;10 reps Gluteal Sets: Strengthening;Both;10 reps Towel Squeeze: Strengthening;Both;10 reps Long Arc Quad: AROM;Strengthening;Both;10 reps;15 reps Knee Flexion: AROM;Strengthening;Both;10 reps;15 reps Marching in Standing: Seated;AROM;Strengthening;15 reps;Both;10 reps Other  Exercises Other Exercises: Standing marching in place x 10 with small amplitude marches    General Comments        Pertinent Vitals/Pain Pain Assessment: 0-10 Pain Score: 5  Pain Location: headache, back & neck pain Pain Descriptors / Indicators: Headache Pain Intervention(s): Premedicated before session;Monitored during session    Home Living                      Prior Function            PT Goals (current goals can now be found in the care plan section) Progress towards PT goals: Not progressing toward goals - comment (limited by orthostatic hypotension)    Frequency    Min 2X/week      PT Plan Current plan remains appropriate    Co-evaluation              AM-PAC PT "6 Clicks" Mobility   Outcome Measure  Help needed turning from your back to your side while in a flat bed without using bedrails?: A Little Help needed moving from lying on your back to sitting on the side of a flat bed without using bedrails?: A Little Help needed moving to and from a bed to a chair (including a wheelchair)?: A Little Help needed standing up from a chair using your arms (e.g., wheelchair or bedside chair)?: A Little Help needed to walk in hospital room?: A Lot Help needed climbing 3-5 steps with a railing? : A Lot 6 Click Score: 16    End of Session Equipment Utilized During Treatment: Gait belt;Other (comment) (Ted hose and abdominal binder) Activity Tolerance: Treatment limited secondary to medical complications (Comment) (orthostatic hypotension) Patient left: in bed;with call bell/phone within reach;with bed alarm set Nurse Communication: Mobility status PT Visit Diagnosis: Unsteadiness on feet (R26.81);Muscle weakness (generalized) (M62.81);History of falling (Z91.81);Other abnormalities of gait and mobility (R26.89)     Time: 1104-1130 PT Time Calculation (min) (ACUTE ONLY): 26 min  Charges:  $Therapeutic Exercise: 8-22 mins $Therapeutic Activity: 8-22  mins                     D. Scott Errin Chewning PT, DPT 06/14/20, 1:04 PM

## 2020-06-14 NOTE — Progress Notes (Signed)
PROGRESS NOTE    Katie [name]  [id number] DOB: 12-Apr-1956 DOA: 04/30/2020 PCP: Patient, [name]   Brief Narrative: Taken from prior notes. Katie [name] a 65 y.o.female with a history of diabetes mellitus type 2, stage IV CKD, heart failure, hypertension. Patient presented secondary to dyspnea with evidence of volume overload and pleural effusion.  Patient has had an extended hospital course. Her acute medical issues have essentially resolved at this point. She is medically stable for discharge however we do not have a safe disposition plan. Patient does not have stable living and lives with a friend here. She states that the friend likely will not be able to care for her.  TOC working to find placement however multiple facilities have refused at this point. Senior case management involved. Expanding search.  Patient remains stable.Continues to be very unmotivated always having pain complaints when PT attempted to work with her however then later complains of buttock pain from sitting too much. Been reinforce the need for exertion and ambulation daily basis.  Pt also very orthostatic and having orthostatics checked daily.remained positive for orthostasis despite starting on midodrine and TED hose, will try abdominal binder, most likely autonomic dysfunction.   Subjective: Patient was eating her breakfast when seen today.  No new complaint.  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)   Hyperkalemia   Pressure injury of skin   Pleural effusion   Acute heart failure with preserved ejection fraction (HFpEF) (HCC)   Chronic kidney disease (CKD), stage IV (severe) ([place])   Pulmonary hypertension, unspecified (HCC)   ATN (acute tubular necrosis) (HCC)  HFpEF.  Echo done in December 2021 with normal  EF and grade 2 diastolic dysfunction.Associated RV systolic dysfunction with elevated PA pressure seen on TTE although RHC on 12/28 significant for mildly elevated right heart/pulmonary artery pressures.    She was initially admitted with volume overload.  Initially managed with IV Lasix, progress to ESRD requiring dialysis, currently volume is being managed with dialysis.  Appears euvolemic. Difficult disposition due to unsafe discharge, apparently she was like living with a friend who cannot take care of her anymore.  TOC is working for places accepting facility. -Continue with dialysis. -Continue with torsemide.  Pleural effusion In setting of fluid overload with new diagnosis of grade 2 diastolic dysfunction  Echo 05/01/20 with nml EF S/p diagnostic Rt thoracentesis performed on 12/20 No evidence of maligancy on cytology  Diet controlled diabetes mellitus, type II.  CBG well controlled and A1c of 4.7 which is against the diagnosis of diabetes. -Currently diet controlled -Continue to monitor.  Orthostatic hypotension.  Patient remained significantly orthostatic positive, most likely autonomic dysfunction.  Baseline blood pressure mildly elevated today. -Continue with midodrine -Continue with TED hose -Can try abdominal binder -Continue to monitor  ESRD-MWF- Unknown baseline. 2/2 HTN and atherosclerosis Hx/o NSADS Patient is unsure of her primary nephrologist in [place].  Creatinine of 2.97 on admission.  Per patient, she was planned to start hemodialysis in addition to consideration for renal transplant.  Nephrology consulted on 12/20.  Worsening renal function in setting of attempted Lasix diuresis for heart failure.  She was started on dialysis by nephrology. Patient on midodrine 10 mg on dialysis days due to orthostatic hypotension which she becomes very symptomatic Midodrine 5 mg p.o. 3  times daily started on on 1/22 besides the 10 mg given on dialysis day Expected [place] Pending financial clearance for discharge planning TOC attempting disposition to SNF Nephrology following.  Moderate pericardial effusion No clinical evidence of tamponade.  No recent infections.  In setting of CKD V with elevated BUN. No chest pain. -cardiology rec. Outpatient Echo for surveillance Hemodynamics stable , continue to monitor  Mild/moderate aortic stenosis Unsure if this is symptomatic.  Patient does have dyspnea with exertion but this may be multifactorial. -Cardiology recommendations: outpatient Transthoracic Echocardiogram for surveillance.  Essential hypertension.  Blood pressure within goal today. On multiple meds which include carvedilol, losartan, torsemide along with midodrine to see if that will help with her positive orthostatic vitals. Patient needs midodrine during dialysis. -Losartan was discontinued and she was started on clonidine by nephrology today. -Continue rest of the medications and monitor.  Anemia Possible related to underlying kidney disease.  Evidence of macrocytosis on CBC.  TIBC low but otherwise normal iron panel. Vitamin B12 level 358, target > 400, started oral supplement and folate normal.  Pressure injury Mid coccyx, POA  History of tobacco use Patient has quit for at least 10 years. Previously, 20 pack year history.  Headache Normal chronic headache. Patient states she takes Imitrex as needed.  Continue Tylenol and oxycodone as needed 1/8 s/p Fioricet 1 dose 1/9 IV Solu-Medrol 60 mg x 1 dose, Benadryl 25 mg IV x1 dose and Phenergan 25 mg IV x1 dose given  Insomnia, started trazodone and melatonin Continue melatonin and trazodone nightly,seems to be helping patient to sleep  Obesity Body mass index is 30.91 kg/m.  Objective: Vitals:   06/14/20 0414 06/14/20 0700 06/14/20 0718 06/14/20 1506  BP: 133/76  (!) 111/56 (!) 175/76  Pulse: 76  71 77  Resp: '18  16 17  '$ Temp: 98.9 F (37.2 C)  98.1 F (36.7  C) 98.6 F (37 C)  TempSrc:    Oral  SpO2: 97%  95% 98%  Weight:  76.1 kg    Height:        Intake/Output Summary (Last 24 hours) at 06/14/2020 1745 Last data filed at 06/14/2020 1436 Gross per 24 hour  Intake 890 ml  Output 0 ml  Net 890 ml   Filed Weights   06/10/20 0500 06/11/20 0658 06/14/20 0700  Weight: 77.9 kg 76.7 kg 76.1 kg    Examination:  General.  Well-developed lady, in no acute distress. Pulmonary.  Lungs clear bilaterally, normal respiratory effort. CV.  Regular rate and rhythm, no JVD, rub or murmur. Abdomen.  Soft, nontender, nondistended, BS positive. CNS.  Alert and oriented x3.  No focal neurologic deficit. Extremities.  No edema, no cyanosis, pulses intact and symmetrical. Psychiatry.  Judgment and insight appears normal.  DVT prophylaxis: Heparin Code Status: Full Family Communication: Discussed with patient Disposition Plan:  Status is: Inpatient  Remains inpatient appropriate because:Unsafe d/c plan   Dispo: The patient is from: Home              Anticipated d/c is to: SNF              Anticipated d/c date is: 1 day              Patient currently is medically stable to d/c.   Difficult to place patient Yes              Level of care: Med-Surg  Consultants:   Nephrology  Procedures:  Antimicrobials:  Data Reviewed: I have personally reviewed following labs and imaging studies  CBC: Recent Labs  Lab 06/11/20 1023  WBC 6.7  HGB 8.9*  HCT 27.8*  MCV 96.9  PLT [id number]   Basic Metabolic Panel: Recent Labs  Lab 06/11/20 1023  NA 135  K 4.2  CL 100  CO2 26  GLUCOSE 134*  BUN 45*  CREATININE 2.79*  CALCIUM 8.5*  PHOS 5.2*   GFR: Estimated Creatinine Clearance: 21.8 mL/min (A) (by C-G formula based on SCr of 2.79 mg/dL (H)). Liver Function Tests: No results for input(s): AST, ALT, ALKPHOS, BILITOT, PROT, ALBUMIN in the last 168 hours. No results for input(s): LIPASE, AMYLASE in the last 168 hours. No results for input(s):  AMMONIA in the last 168 hours. Coagulation Profile: No results for input(s): INR, PROTIME in the last 168 hours. Cardiac Enzymes: No results for input(s): CKTOTAL, CKMB, CKMBINDEX, TROPONINI in the last 168 hours. BNP (last 3 results) No results for input(s): PROBNP in the last 8760 hours. HbA1C: No results for input(s): HGBA1C in the last 72 hours. CBG: No results for input(s): GLUCAP in the last 168 hours. Lipid Profile: No results for input(s): CHOL, HDL, LDLCALC, TRIG, CHOLHDL, LDLDIRECT in the last 72 hours. Thyroid Function Tests: No results for input(s): TSH, T4TOTAL, FREET4, T3FREE, THYROIDAB in the last 72 hours. Anemia Panel: No results for input(s): VITAMINB12, FOLATE, FERRITIN, TIBC, IRON, RETICCTPCT in the last 72 hours. Sepsis Labs: No results for input(s): PROCALCITON, LATICACIDVEN in the last 168 hours.  No results found for this or any previous visit (from the past 240 hour(s)).   Radiology Studies: No results found.  Scheduled Meds: . carvedilol  12.5 mg Oral BID WC  . Chlorhexidine Gluconate Cloth  6 each Topical Q0600  . cholecalciferol  1,000 Units Oral Daily  . cloNIDine  0.1 mg Oral BID  . epoetin (EPOGEN/PROCRIT) injection  10,000 Units Intravenous Q M,W,F-HD  . feeding supplement  237 mL Oral BID BM  . heparin  5,000 Units Subcutaneous Q8H  . lidocaine  1 patch Transdermal Q24H  . melatonin  5 mg Oral QPM  . methocarbamol  500 mg Oral QID  . midodrine  10 mg Oral TID WC  . multivitamin  1 tablet Oral QHS  . polyethylene glycol  17 g Oral Daily  . senna-docusate  1 tablet Oral BID  . sevelamer carbonate  800 mg Oral TID WC  . sodium chloride flush  3 mL Intravenous Q12H  . topiramate  25 mg Oral BID  . torsemide  10 mg Oral Daily  . traZODone  50 mg Oral QPM  . vitamin B-12  100 mcg Oral Daily   Continuous Infusions: . sodium chloride       LOS: 44 days   Time spent: 25 minutes.  Lorella Nimrod, MD [facility]  If 7PM-7AM, please  contact night-coverage [email or web address]  06/14/2020, 5:45 PM   This record has been created using Systems analyst. Errors have been sought and corrected,but may not always be located. Such creation errors do not reflect on the standard of care.

## 2020-06-14 NOTE — Progress Notes (Signed)
[facility]  ROUNDING NOTE   Subjective:   States her headache is better now but she had it earlier in the day She was working with physical therapist but reports her blood pressure dropped Orthostatic drop of blood pressure noted from [id number] systolic  Objective:  Vital signs in last 24 hours:  Temp:  [98.1 F (36.7 C)-98.9 F (37.2 C)] 98.1 F (36.7 C) (02/01 0718) Pulse Rate:  [68-80] 71 (02/01 0718) Resp:  [11-18] 16 (02/01 0718) BP: (111-199)/(56-82) 111/56 (02/01 0718) SpO2:  [95 %-100 %] 95 % (02/01 0718) Weight:  [76.1 kg] 76.1 kg (02/01 0700)  Weight change:  Filed Weights   06/10/20 0500 06/11/20 0658 06/14/20 0700  Weight: 77.9 kg 76.7 kg 76.1 kg    Intake/Output: I/O last 3 completed shifts: In: -  Out: 1150 [Urine:1150]   Intake/Output this shift:  Total I/O In: 890 [P.O.:887; I.V.:3] Out: -   Physical Exam: General:  Laying in the bed  HEENT  normocephalic, atraumatic  Pulm/lungs  clear bilateral, room air  CVS/Heart  regular  Abdomen:   Soft, nontender, nondistended  Extremities:  Trace to 1+, compression socks  Neurologic:  AAOx4  Skin:  No acute rashes, or lesions   Access: RIJ permcath 05/16/20 Dr. Lucky Cowboy    Basic Metabolic Panel: Recent Labs  Lab 06/11/20 1023  NA 135  K 4.2  CL 100  CO2 26  GLUCOSE 134*  BUN 45*  CREATININE 2.79*  CALCIUM 8.5*  PHOS 5.2*    Liver Function Tests: No results for input(s): AST, ALT, ALKPHOS, BILITOT, PROT, ALBUMIN in the last 168 hours. No results for input(s): LIPASE, AMYLASE in the last 168 hours. No results for input(s): AMMONIA in the last 168 hours.  CBC: Recent Labs  Lab 06/11/20 1023  WBC 6.7  HGB 8.9*  HCT 27.8*  MCV 96.9  PLT 270    Cardiac Enzymes: No results for input(s): CKTOTAL, CKMB, CKMBINDEX, TROPONINI in the last 168 hours.  BNP: Invalid input(s): POCBNP  CBG: No results for input(s): GLUCAP in the last 168 hours.  Microbiology: Results for orders  placed or performed during the hospital encounter of 04/30/20  Resp Panel by RT-PCR (Flu A&B, Covid) Nasopharyngeal Swab     Status: None   Collection Time: 04/30/20  4:01 PM   Specimen: Nasopharyngeal Swab; Nasopharyngeal(NP) swabs in vial transport medium  Result Value Ref Range Status   SARS Coronavirus 2 by RT PCR NEGATIVE NEGATIVE Final    Comment: (NOTE) SARS-CoV-2 target nucleic acids are NOT DETECTED.  The SARS-CoV-2 RNA is generally detectable in upper respiratory specimens during the acute phase of infection. The lowest concentration of SARS-CoV-2 viral copies this assay can detect is 138 copies/mL. A negative result does not preclude SARS-Cov-2 infection and should not be used as the sole basis for treatment or other patient management decisions. A negative result may occur with  improper specimen collection/handling, submission of specimen other than nasopharyngeal swab, presence of viral mutation(s) within the areas targeted by this assay, and inadequate number of viral copies(<138 copies/mL). A negative result must be combined with clinical observations, patient history, and epidemiological information. The expected result is Negative.  Fact Sheet for Patients:  EntrepreneurPulse.com.au  Fact Sheet for Healthcare Providers:  IncredibleEmployment.be  This test is no t yet approved or cleared by the [place] FDA and  has been authorized for detection and/or diagnosis of SARS-CoV-2 by FDA under an Emergency Use Authorization (EUA). This EUA will remain  in  effect (meaning this test can be used) for the duration of the COVID-19 declaration under Section 564(b)(1) of the Act, 21 U.S.C.section 360bbb-3(b)(1), unless the authorization is terminated  or revoked sooner.       Influenza A by PCR NEGATIVE NEGATIVE Final   Influenza B by PCR NEGATIVE NEGATIVE Final    Comment: (NOTE) The Xpert Xpress SARS-CoV-2/FLU/RSV plus assay is  intended as an aid in the diagnosis of influenza from Nasopharyngeal swab specimens and should not be used as a sole basis for treatment. Nasal washings and aspirates are unacceptable for Xpert Xpress SARS-CoV-2/FLU/RSV testing.  Fact Sheet for Patients: EntrepreneurPulse.com.au  Fact Sheet for Healthcare Providers: IncredibleEmployment.be  This test is not yet approved or cleared by the [place] FDA and has been authorized for detection and/or diagnosis of SARS-CoV-2 by FDA under an Emergency Use Authorization (EUA). This EUA will remain in effect (meaning this test can be used) for the duration of the COVID-19 declaration under Section 564(b)(1) of the Act, 21 U.S.C. section 360bbb-3(b)(1), unless the authorization is terminated or revoked.  Performed at [facility], [address] [place], [place] [address]   Body fluid culture     Status: None   Collection Time: 05/02/20  4:44 PM   Specimen: PATH Cytology Pleural fluid  Result Value Ref Range Status   Specimen Description   Final    PLEURAL Performed at [facility], [address] [place], [place] [address]    Special Requests   Final    NONE Performed at [facility], [address] [place], [place] [address]    Gram Stain   Final    MODERATE WBC PRESENT, PREDOMINANTLY MONONUCLEAR NO ORGANISMS SEEN    Culture   Final    NO GROWTH Performed at [facility], [place] [address] [place], [place] [address]    Report Status 05/06/2020 FINAL  Final  MRSA PCR Screening     Status: None   Collection Time: 05/16/20  9:29 PM   Specimen: Nasal Mucosa; Nasopharyngeal  Result Value Ref Range Status   MRSA by PCR NEGATIVE NEGATIVE Final    Comment:        The GeneXpert MRSA Assay (FDA approved for NASAL specimens only), is one component of a comprehensive MRSA colonization surveillance program. It is not intended to diagnose MRSA infection nor  to guide or monitor treatment for MRSA infections. Performed at [facility], [address] [place], [place] [address]     Coagulation Studies: No results for input(s): LABPROT, INR in the last 72 hours.  Urinalysis: No results for input(s): COLORURINE, LABSPEC, PHURINE, GLUCOSEU, HGBUR, BILIRUBINUR, KETONESUR, PROTEINUR, UROBILINOGEN, NITRITE, LEUKOCYTESUR in the last 72 hours.  Invalid input(s): APPERANCEUR    Imaging: No results found.   Medications:   . sodium chloride     . carvedilol  12.5 mg Oral BID WC  . Chlorhexidine Gluconate Cloth  6 each Topical Q0600  . cholecalciferol  1,000 Units Oral Daily  . epoetin (EPOGEN/PROCRIT) injection  10,000 Units Intravenous Q M,W,F-HD  . feeding supplement  237 mL Oral BID BM  . heparin  5,000 Units Subcutaneous Q8H  . lidocaine  1 patch Transdermal Q24H  . losartan  25 mg Oral QHS  . melatonin  5 mg Oral QPM  . methocarbamol  500 mg Oral QID  . multivitamin  1 tablet Oral QHS  . polyethylene glycol  17 g Oral Daily  . senna-docusate  1 tablet Oral  BID  . sevelamer carbonate  800 mg Oral TID WC  . sodium chloride flush  3 mL Intravenous Q12H  . topiramate  25 mg Oral BID  . torsemide  10 mg Oral Daily  . traZODone  50 mg Oral QPM  . vitamin B-12  100 mcg Oral Daily   sodium chloride, acetaminophen **OR** acetaminophen, ALPRAZolam, benzonatate, bisacodyl, chlorpheniramine-HYDROcodone, ondansetron **OR** ondansetron (ZOFRAN) IV, oxyCODONE-acetaminophen, promethazine, sodium chloride flush, SUMAtriptan  Assessment/ Plan:  [name] is a 65 y.o. white female with diastolic congestive heart failure, hypertension, diabetes mellitus type II, CVA who is admitted to [facility] on 04/30/2020 for Chronic kidney disease (CKD), stage IV (severe) ([place]) [N18.4] Volume overload [E87.70]  Patient moved from [place] in December 2021. She was following with nephrology there. Patient initiated on hemodialysis this admission.  Patient has a prolonged hospitalization. She was initially admitted with volume overload and acute exacerbation of congestive heart failure. Failed IV diuretics and started on hemodialysis on 12/29. No outpatient dialysis due to patient's lack of insurance and not a resident of the state of [place]. Held dialysis for possible recovery however after several days, creatinine rising and volume accumulation. Patient then resumed on dialysis. She has been weaned down to twice a week.   1. End stage renal disease  - Changed to twice a week dialysis, Mondays and Fridays. Seated in chair and tolerating treatment well.    2. Hypertension with lower extremity edema:  With autonomic orthostatics. Now off amlodipine -Significant orthostatic hypotension noted -Clonidine for high blood pressure control -Midodrine for low blood pressure We may have to reevaluate dose of Coreg Lower extremity edema is controlled therefore torsemide was decreased to 10 mg daily  3. Anemia of chronic kidney disease:  Lab Results  Component Value Date   HGB 8.9 (L) 06/11/2020  EPO with HD treatments  4. Secondary hyperparathyroidism Lab Results  Component Value Date   PTH 70 (H) 05/12/2020   CALCIUM 8.5 (L) 06/11/2020   PHOS 5.2 (H) 06/11/2020  - sevelamer with meals    LOS: [place] 2/1/20223:02 PM

## 2020-06-14 NOTE — Progress Notes (Signed)
Nutrition Follow-up  DOCUMENTATION CODES:   Obesity unspecified  INTERVENTION:  Continue Ensure Enlive po BID, each supplement provides 350 kcal and 20 grams of protein.  Continue Rena-vit po QHS.  Encouraged ongoing adequate intake.  NUTRITION DIAGNOSIS:   Increased nutrient needs related to chronic illness (CKD IV with temp HD, CHF) as evidenced by estimated needs.  Ongoing.  GOAL:   Patient will meet greater than or equal to 90% of their needs  Progressing.  MONITOR:   PO intake,Supplement acceptance,Labs,Weight trends,Skin,I & O's  REASON FOR ASSESSMENT:   LOS    ASSESSMENT:   65 y/o female with h/o CKD IV, CHF, DM and [place] who is admitted with AKI and volume overload.  Met with patient at bedside. She reports her appetite continues to be good and she is eating well at meals. She is eating 100% of most meals. She reports she missed dinner last night due to HD. She enjoys Ensure and is drinking 1-2 bottles daily. Encouraged patient to drink them BID.  Medications reviewed and include: vitamin D3 1000 units daily, Epogen 10000 units during HD, Rena-vit QHS, Miralax, senna-docusate 1 tablet BID, Renvela 800 mg TID, vitamin B12 100 micrograms.  Labs reviewed: BUN 45, Creatinine 2.79, Phosphorus 5.2.  I/O: 600 mL UOP Yesterday + 1 occurrences unmeasured UOP  Weight trend: 76.1 kg on 2/1; -15.6 kg from 12/18; pt has been diuresed this admission and is -4010 just since 1/18  Patient is at risk for malnutrition. Unable to determine how much weight loss was related to fluid vs true weight loss and could not find recent NFPE to compare. Will continue to monitor.  NUTRITION - FOCUSED PHYSICAL EXAM:  Flowsheet Row Most Recent Value  Orbital Region No depletion  Upper Arm Region Mild depletion  Thoracic and Lumbar Region No depletion  Buccal Region No depletion  Temple Region Mild depletion  Clavicle Bone Region No depletion  Clavicle and Acromion Bone Region No  depletion  Scapular Bone Region No depletion  Dorsal Hand Mild depletion  Patellar Region Moderate depletion  Anterior Thigh Region Moderate depletion  Posterior Calf Region Moderate depletion  Edema (RD Assessment) None  Hair Reviewed  Eyes Reviewed  Mouth Reviewed  Skin Reviewed  Nails Reviewed     Diet Order:   Diet Order            Diet 2 gram sodium Room service appropriate? Yes; Fluid consistency: Thin; Fluid restriction: 1200 mL Fluid  Diet effective now                EDUCATION NEEDS:   Education needs have been addressed  Skin:  Skin Assessment: Skin Integrity Issues: Skin Integrity Issues:: Stage II Stage II: coccyx  Last BM:  06/14/2020 - large type 5  Height:   Ht Readings from Last 1 Encounters:  05/16/20 $RemoveB'5\' 6"'XyxHnbax$  (1.676 m)   Weight:   Wt Readings from Last 1 Encounters:  06/14/20 76.1 kg   Ideal Body Weight:  59 kg  BMI:  Body mass index is 27.08 kg/m.  Estimated Nutritional Needs:   Kcal:  1900-2200kcal/day  Protein:  95-110g/day  Fluid:  UOP +1L  Jacklynn Barnacle, MS, RD, LDN Pager number available on Amion

## 2020-06-15 MED ORDER — DIPHENHYDRAMINE HCL 50 MG/ML IJ SOLN
12.5000 mg | Freq: Once | INTRAMUSCULAR | Status: AC
  Administered 2020-06-15: 12.5 mg via INTRAVENOUS
  Filled 2020-06-15: qty 1

## 2020-06-15 MED ORDER — PROCHLORPERAZINE EDISYLATE 10 MG/2ML IJ SOLN
10.0000 mg | Freq: Once | INTRAMUSCULAR | Status: AC
  Administered 2020-06-15: 10 mg via INTRAVENOUS
  Filled 2020-06-15: qty 2

## 2020-06-15 MED ORDER — KETOROLAC TROMETHAMINE 30 MG/ML IJ SOLN
30.0000 mg | Freq: Once | INTRAMUSCULAR | Status: AC
  Administered 2020-06-15: 30 mg via INTRAVENOUS
  Filled 2020-06-15: qty 1

## 2020-06-15 NOTE — Plan of Care (Signed)
  Problem: Activity: Goal: Capacity to carry out activities will improve Outcome: Progressing   Problem: Coping: Goal: Level of anxiety will decrease Outcome: Progressing   Problem: Pain Managment: Goal: General experience of comfort will improve Outcome: Progressing

## 2020-06-15 NOTE — Progress Notes (Signed)
PROGRESS NOTE    Katie [name]  [id number] DOB: December 24, 1955 DOA: 04/30/2020 PCP: Patient, [name]   Brief Narrative: Taken from prior notes. Katie [name] a 65 y.o.female with a history of diabetes mellitus type 2, stage IV CKD, heart failure, hypertension. Patient presented secondary to dyspnea with evidence of volume overload and pleural effusion.  Patient has had an extended hospital course. Her acute medical issues have essentially resolved at this point. She is medically stable for discharge however we do not have a safe disposition plan. Patient does not have stable living and lives with a friend here. She states that the friend likely will not be able to care for her.  TOC working to find placement however multiple facilities have refused at this point. Senior case management involved. Expanding search.  Patient remains stable.Continues to be very unmotivated always having pain complaints when PT attempted to work with her however then later complains of buttock pain from sitting too much. Been reinforce the need for exertion and ambulation daily basis.  Pt also very orthostatic and having orthostatics checked daily.remained positive for orthostasis despite starting on midodrine and TED hose, will try abdominal binder, most likely autonomic dysfunction.   Subjective: Patient was having 9 out of 10 headache, associated with nausea and photophobia.  She received Percocet and Imitrex with no relief.  Asking for something stronger.  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)   Hyperkalemia   Pressure injury of skin   Pleural effusion   Acute heart failure with preserved ejection fraction (HFpEF) (HCC)   Chronic kidney disease (CKD), stage IV (severe) ([place])   Pulmonary hypertension, unspecified  (HCC)   ATN (acute tubular necrosis) (HCC)  HFpEF.  Echo done in December 2021 with normal EF and grade 2 diastolic dysfunction.Associated RV systolic dysfunction with elevated PA pressure seen on TTE although RHC on 12/28 significant for mildly elevated right heart/pulmonary artery pressures.    She was initially admitted with volume overload.  Initially managed with IV Lasix, progress to ESRD requiring dialysis, currently volume is being managed with dialysis.  Appears euvolemic. Difficult disposition due to unsafe discharge, apparently she was like living with a friend who cannot take care of her anymore.  TOC is working for places accepting facility. -Continue with dialysis. -Continue with torsemide.  Pleural effusion In setting of fluid overload with new diagnosis of grade 2 diastolic dysfunction  Echo 05/01/20 with nml EF S/p diagnostic Rt thoracentesis performed on 12/20 No evidence of maligancy on cytology  Diet controlled diabetes mellitus, type II.  CBG well controlled and A1c of 4.7 which is against the diagnosis of diabetes. -Currently diet controlled -Continue to monitor.  Orthostatic hypotension.  Patient remained significantly orthostatic positive, most likely autonomic dysfunction.  Baseline blood pressure mildly elevated today. -Continue with midodrine -Continue with TED hose -Can try abdominal binder -Continue to monitor  ESRD-MWF- Unknown baseline. 2/2 HTN and atherosclerosis Hx/o NSADS Patient is unsure of her primary nephrologist in [place].  Creatinine of 2.97 on admission.  Per patient, she was planned to start hemodialysis in addition to consideration for renal transplant.  Nephrology consulted on 12/20.  Worsening renal function in setting of attempted Lasix diuresis for heart failure.  She was started on dialysis by nephrology. Patient on midodrine 10 mg on dialysis  days due to orthostatic hypotension which she becomes very symptomatic Midodrine 5 mg  p.o. 3 times daily started on on 1/22 besides the 10 mg given on dialysis day Expected [place] Pending financial clearance for discharge planning TOC attempting disposition to SNF Nephrology following.  Headache History of chronic headache. Patient states she takes Imitrex as needed.  Continue Tylenol and oxycodone as needed 1/8 s/p Fioricet 1 dose 1/9 IV Solu-Medrol 60 mg x 1 dose, Benadryl 25 mg IV x1 dose and Phenergan 25 mg IV x1 dose given 2/2.  give her one-time dose of Toradol, Benadryl and Compazine.  Moderate pericardial effusion No clinical evidence of tamponade.  No recent infections.  In setting of CKD V with elevated BUN. No chest pain. -cardiology rec. Outpatient Echo for surveillance Hemodynamics stable , continue to monitor  Mild/moderate aortic stenosis Unsure if this is symptomatic.  Patient does have dyspnea with exertion but this may be multifactorial. -Cardiology recommendations: outpatient Transthoracic Echocardiogram for surveillance.  Essential hypertension.  Blood pressure within goal today. On multiple meds which include carvedilol, losartan, torsemide along with midodrine to see if that will help with her positive orthostatic vitals. Patient needs midodrine during dialysis. -Losartan was discontinued and she was started on clonidine by nephrology today. -Continue rest of the medications and monitor.  Anemia Possible related to underlying kidney disease.  Evidence of macrocytosis on CBC.  TIBC low but otherwise normal iron panel. Vitamin B12 level 358, target > 400, started oral supplement and folate normal.  Pressure injury Mid coccyx, POA  History of tobacco use Patient has quit for at least 10 years. Previously, 20 pack year history.  Insomnia, started trazodone and melatonin Continue melatonin and trazodone nightly,seems to be helping patient to sleep  Obesity Body mass index is 30.91 kg/m.  Objective: Vitals:    06/15/20 0516 06/15/20 0838 06/15/20 1056 06/15/20 1512  BP: (!) 146/65 (!) 187/77 (!) 179/84 131/65  Pulse: 62 65 64 64  Resp: '16 14 16 15  '$ Temp: 97.9 F (36.6 C) 97.8 F (36.6 C) 98.4 F (36.9 C) 98.1 F (36.7 C)  TempSrc: Oral  Oral Oral  SpO2: 98% 98% 96% 96%  Weight:      Height:        Intake/Output Summary (Last 24 hours) at 06/15/2020 1558 Last data filed at 06/15/2020 0516 Gross per 24 hour  Intake --  Output 0 ml  Net 0 ml   Filed Weights   06/10/20 0500 06/11/20 0658 06/14/20 0700  Weight: 77.9 kg 76.7 kg 76.1 kg    Examination:  General.  Well-developed lady, lying with blankets overhead, appears in pain. Pulmonary.  Lungs clear bilaterally, normal respiratory effort. CV.  Regular rate and rhythm, no JVD, rub or murmur. Abdomen.  Soft, nontender, nondistended, BS positive. CNS.  Alert and oriented x3.  No focal neurologic deficit. Extremities.  No edema, no cyanosis, pulses intact and symmetrical. Psychiatry.  Judgment and insight appears normal.  DVT prophylaxis: Heparin Code Status: Full Family Communication: Discussed with patient Disposition Plan:  Status is: Inpatient  Remains inpatient appropriate because:Unsafe d/c plan   Dispo: The patient is from: Home              Anticipated d/c is to: SNF              Anticipated d/c date is: 1 day              Patient currently is medically stable to d/c.  Difficult to place patient Yes              Level of care: Med-Surg  Consultants:   Nephrology  Procedures:  Antimicrobials:   Data Reviewed: I have personally reviewed following labs and imaging studies  CBC: Recent Labs  Lab 06/11/20 1023  WBC 6.7  HGB 8.9*  HCT 27.8*  MCV 96.9  PLT [id number]   Basic Metabolic Panel: Recent Labs  Lab 06/11/20 1023  NA 135  K 4.2  CL 100  CO2 26  GLUCOSE 134*  BUN 45*  CREATININE 2.79*  CALCIUM 8.5*  PHOS 5.2*   GFR: Estimated Creatinine Clearance: 21.8 mL/min (A) (by C-G formula based on SCr of  2.79 mg/dL (H)). Liver Function Tests: No results for input(s): AST, ALT, ALKPHOS, BILITOT, PROT, ALBUMIN in the last 168 hours. No results for input(s): LIPASE, AMYLASE in the last 168 hours. No results for input(s): AMMONIA in the last 168 hours. Coagulation Profile: No results for input(s): INR, PROTIME in the last 168 hours. Cardiac Enzymes: No results for input(s): CKTOTAL, CKMB, CKMBINDEX, TROPONINI in the last 168 hours. BNP (last 3 results) No results for input(s): PROBNP in the last 8760 hours. HbA1C: No results for input(s): HGBA1C in the last 72 hours. CBG: No results for input(s): GLUCAP in the last 168 hours. Lipid Profile: No results for input(s): CHOL, HDL, LDLCALC, TRIG, CHOLHDL, LDLDIRECT in the last 72 hours. Thyroid Function Tests: No results for input(s): TSH, T4TOTAL, FREET4, T3FREE, THYROIDAB in the last 72 hours. Anemia Panel: No results for input(s): VITAMINB12, FOLATE, FERRITIN, TIBC, IRON, RETICCTPCT in the last 72 hours. Sepsis Labs: No results for input(s): PROCALCITON, LATICACIDVEN in the last 168 hours.  No results found for this or any previous visit (from the past 240 hour(s)).   Radiology Studies: No results found.  Scheduled Meds: . carvedilol  12.5 mg Oral BID WC  . Chlorhexidine Gluconate Cloth  6 each Topical Q0600  . cholecalciferol  1,000 Units Oral Daily  . cloNIDine  0.1 mg Oral BID  . epoetin (EPOGEN/PROCRIT) injection  10,000 Units Intravenous Q M,W,F-HD  . feeding supplement  237 mL Oral BID BM  . heparin  5,000 Units Subcutaneous Q8H  . lidocaine  1 patch Transdermal Q24H  . melatonin  5 mg Oral QPM  . methocarbamol  500 mg Oral QID  . midodrine  10 mg Oral TID WC  . multivitamin  1 tablet Oral QHS  . polyethylene glycol  17 g Oral Daily  . senna-docusate  1 tablet Oral BID  . sevelamer carbonate  800 mg Oral TID WC  . sodium chloride flush  3 mL Intravenous Q12H  . topiramate  25 mg Oral BID  . torsemide  10 mg Oral Daily   . traZODone  50 mg Oral QPM  . vitamin B-12  100 mcg Oral Daily   Continuous Infusions: . sodium chloride       LOS: 45 days   Time spent: 25 minutes.  Lorella Nimrod, MD [facility]  If 7PM-7AM, please contact night-coverage [email or web address]  06/15/2020, 3:58 PM   This record has been created using Systems analyst. Errors have been sought and corrected,but may not always be located. Such creation errors do not reflect on the standard of care.

## 2020-06-15 NOTE — Progress Notes (Signed)
[facility]  ROUNDING NOTE   Subjective:   Patient states that she has headache again today Expecting to work with occupational therapy later today Eating some. No nausea or vomiting reported  Objective:  Vital signs in last 24 hours:  Temp:  [97.8 F (36.6 C)-98.8 F (37.1 C)] 98.4 F (36.9 C) (02/02 1056) Pulse Rate:  [62-77] 64 (02/02 1056) Resp:  [14-18] 16 (02/02 1056) BP: (146-187)/(65-84) 179/84 (02/02 1056) SpO2:  [96 %-98 %] 96 % (02/02 1056)  Weight change:  Filed Weights   06/10/20 0500 06/11/20 0658 06/14/20 0700  Weight: 77.9 kg 76.7 kg 76.1 kg    Intake/Output: I/O last 3 completed shifts: In: 890 [P.O.:887; I.V.:3] Out: 0    Intake/Output this shift:  No intake/output data recorded.  Physical Exam: General:  Laying in the bed  HEENT  normocephalic, atraumatic  Pulm/lungs  clear bilateral, room air  CVS/Heart  regular  Abdomen:   Soft, nontender, nondistended  Extremities:  Trace to 1+, compression socks  Neurologic:  AAOx4  Skin:  No acute rashes, or lesions   Access: RIJ permcath 05/16/20 Dr. Lucky Cowboy    Basic Metabolic Panel: Recent Labs  Lab 06/11/20 1023  NA 135  K 4.2  CL 100  CO2 26  GLUCOSE 134*  BUN 45*  CREATININE 2.79*  CALCIUM 8.5*  PHOS 5.2*    Liver Function Tests: No results for input(s): AST, ALT, ALKPHOS, BILITOT, PROT, ALBUMIN in the last 168 hours. No results for input(s): LIPASE, AMYLASE in the last 168 hours. No results for input(s): AMMONIA in the last 168 hours.  CBC: Recent Labs  Lab 06/11/20 1023  WBC 6.7  HGB 8.9*  HCT 27.8*  MCV 96.9  PLT 270    Cardiac Enzymes: No results for input(s): CKTOTAL, CKMB, CKMBINDEX, TROPONINI in the last 168 hours.  BNP: Invalid input(s): POCBNP  CBG: No results for input(s): GLUCAP in the last 168 hours.  Microbiology: Results for orders placed or performed during the hospital encounter of 04/30/20  Resp Panel by RT-PCR (Flu A&B, Covid) Nasopharyngeal  Swab     Status: None   Collection Time: 04/30/20  4:01 PM   Specimen: Nasopharyngeal Swab; Nasopharyngeal(NP) swabs in vial transport medium  Result Value Ref Range Status   SARS Coronavirus 2 by RT PCR NEGATIVE NEGATIVE Final    Comment: (NOTE) SARS-CoV-2 target nucleic acids are NOT DETECTED.  The SARS-CoV-2 RNA is generally detectable in upper respiratory specimens during the acute phase of infection. The lowest concentration of SARS-CoV-2 viral copies this assay can detect is 138 copies/mL. A negative result does not preclude SARS-Cov-2 infection and should not be used as the sole basis for treatment or other patient management decisions. A negative result may occur with  improper specimen collection/handling, submission of specimen other than nasopharyngeal swab, presence of viral mutation(s) within the areas targeted by this assay, and inadequate number of viral copies(<138 copies/mL). A negative result must be combined with clinical observations, patient history, and epidemiological information. The expected result is Negative.  Fact Sheet for Patients:  EntrepreneurPulse.com.au  Fact Sheet for Healthcare Providers:  IncredibleEmployment.be  This test is no t yet approved or cleared by the [place] FDA and  has been authorized for detection and/or diagnosis of SARS-CoV-2 by FDA under an Emergency Use Authorization (EUA). This EUA will remain  in effect (meaning this test can be used) for the duration of the COVID-19 declaration under Section 564(b)(1) of the Act, 21 U.S.C.section 360bbb-3(b)(1), unless  the authorization is terminated  or revoked sooner.       Influenza A by PCR NEGATIVE NEGATIVE Final   Influenza B by PCR NEGATIVE NEGATIVE Final    Comment: (NOTE) The Xpert Xpress SARS-CoV-2/FLU/RSV plus assay is intended as an aid in the diagnosis of influenza from Nasopharyngeal swab specimens and should not be used as a sole  basis for treatment. Nasal washings and aspirates are unacceptable for Xpert Xpress SARS-CoV-2/FLU/RSV testing.  Fact Sheet for Patients: EntrepreneurPulse.com.au  Fact Sheet for Healthcare Providers: IncredibleEmployment.be  This test is not yet approved or cleared by the [place] FDA and has been authorized for detection and/or diagnosis of SARS-CoV-2 by FDA under an Emergency Use Authorization (EUA). This EUA will remain in effect (meaning this test can be used) for the duration of the COVID-19 declaration under Section 564(b)(1) of the Act, 21 U.S.C. section 360bbb-3(b)(1), unless the authorization is terminated or revoked.  Performed at [facility], [address] [place], [place] [address]   Body fluid culture     Status: None   Collection Time: 05/02/20  4:44 PM   Specimen: PATH Cytology Pleural fluid  Result Value Ref Range Status   Specimen Description   Final    PLEURAL Performed at [facility], [address], [place], [place] [address]    Special Requests   Final    NONE Performed at [facility], [address] [place], [place] [address]    Gram Stain   Final    MODERATE WBC PRESENT, PREDOMINANTLY MONONUCLEAR NO ORGANISMS SEEN    Culture   Final    NO GROWTH Performed at [facility], [place] [address] [place], [place] [address]    Report Status 05/06/2020 FINAL  Final  MRSA PCR Screening     Status: None   Collection Time: 05/16/20  9:29 PM   Specimen: Nasal Mucosa; Nasopharyngeal  Result Value Ref Range Status   MRSA by PCR NEGATIVE NEGATIVE Final    Comment:        The GeneXpert MRSA Assay (FDA approved for NASAL specimens only), is one component of a comprehensive MRSA colonization surveillance program. It is not intended to diagnose MRSA infection nor to guide or monitor treatment for MRSA infections. Performed at [facility], [address]  [place], [place] [address]     Coagulation Studies: No results for input(s): LABPROT, INR in the last 72 hours.  Urinalysis: No results for input(s): COLORURINE, LABSPEC, PHURINE, GLUCOSEU, HGBUR, BILIRUBINUR, KETONESUR, PROTEINUR, UROBILINOGEN, NITRITE, LEUKOCYTESUR in the last 72 hours.  Invalid input(s): APPERANCEUR    Imaging: No results found.   Medications:   . sodium chloride     . carvedilol  12.5 mg Oral BID WC  . Chlorhexidine Gluconate Cloth  6 each Topical Q0600  . cholecalciferol  1,000 Units Oral Daily  . cloNIDine  0.1 mg Oral BID  . epoetin (EPOGEN/PROCRIT) injection  10,000 Units Intravenous Q M,W,F-HD  . feeding supplement  237 mL Oral BID BM  . heparin  5,000 Units Subcutaneous Q8H  . lidocaine  1 patch Transdermal Q24H  . melatonin  5 mg Oral QPM  . methocarbamol  500 mg Oral QID  . midodrine  10 mg Oral TID WC  . multivitamin  1 tablet Oral QHS  . polyethylene glycol  17 g Oral Daily  . senna-docusate  1 tablet Oral BID  . sevelamer carbonate  800 mg Oral TID WC  . sodium chloride  flush  3 mL Intravenous Q12H  . topiramate  25 mg Oral BID  . torsemide  10 mg Oral Daily  . traZODone  50 mg Oral QPM  . vitamin B-12  100 mcg Oral Daily   sodium chloride, acetaminophen **OR** acetaminophen, ALPRAZolam, benzonatate, bisacodyl, chlorpheniramine-HYDROcodone, ondansetron **OR** ondansetron (ZOFRAN) IV, oxyCODONE-acetaminophen, promethazine, sodium chloride flush, SUMAtriptan  Assessment/ Plan:  [name] is a 65 y.o. white female with diastolic congestive heart failure, hypertension, diabetes mellitus type II, CVA who is admitted to [facility] on 04/30/2020 for Chronic kidney disease (CKD), stage IV (severe) ([place]) [N18.4] Volume overload [E87.70]  Patient moved from [place] in December 2021. She was following with nephrology there. Patient initiated on hemodialysis this admission. Patient has a prolonged hospitalization. She was initially admitted with volume  overload and acute exacerbation of congestive heart failure. Failed IV diuretics and started on hemodialysis on 12/29. No outpatient dialysis due to patient's lack of insurance and not a resident of the state of [place]. Held dialysis for possible recovery however after several days, creatinine rising and volume accumulation. Patient then resumed on dialysis. She has been weaned down to twice a week.   1. End stage renal disease  - Changed to twice a week dialysis, Mondays and Fridays. Next hemodialysis anticipated on Friday seated in chair   2. Hypertension with lower extremity edema:  With autonomic orthostatics. Now off amlodipine -Significant orthostatic hypotension noted -Clonidine for high blood pressure control -Midodrine for low blood pressure We may have to reevaluate dose of Coreg Lower extremity edema is controlled therefore torsemide was decreased to 10 mg daily  3. Anemia of chronic kidney disease:  Lab Results  Component Value Date   HGB 8.9 (L) 06/11/2020  EPO with HD treatments  4. Secondary hyperparathyroidism Lab Results  Component Value Date   PTH 70 (H) 05/12/2020   CALCIUM 8.5 (L) 06/11/2020   PHOS 5.2 (H) 06/11/2020  - sevelamer with meals    LOS: [place] 2/2/20223:00 PM

## 2020-06-16 NOTE — Progress Notes (Signed)
[facility]  ROUNDING NOTE   Subjective:   Patient sitting in chair States she walked around nursing station with PT Denies headache Eating some. No nausea or vomiting reported  Objective:  Vital signs in last 24 hours:  Temp:  [98.1 F (36.7 C)-98.6 F (37 C)] 98.5 F (36.9 C) (02/03 1030) Pulse Rate:  [63-73] 73 (02/03 1030) Resp:  [15-17] 17 (02/03 1030) BP: (124-168)/(65-78) 143/78 (02/03 1100) SpO2:  [93 %-98 %] 98 % (02/03 1030)  Weight change:  Filed Weights   06/10/20 0500 06/11/20 0658 06/14/20 0700  Weight: 77.9 kg 76.7 kg 76.1 kg    Intake/Output: No intake/output data recorded.   Intake/Output this shift:  No intake/output data recorded.  Physical Exam: General:  Sitting in chair  HEENT  normocephalic, atraumatic  Pulm/lungs  clear bilateral, room air  CVS/Heart  regular  Abdomen:   Soft, nontender, nondistended  Extremities:  Trace to 1+, compression socks  Neurologic:  AAOx4  Skin:  No acute rashes, or lesions    Access: RIJ permcath 05/16/20 Dr. Lucky Cowboy    Basic Metabolic Panel: Recent Labs  Lab 06/11/20 1023  NA 135  K 4.2  CL 100  CO2 26  GLUCOSE 134*  BUN 45*  CREATININE 2.79*  CALCIUM 8.5*  PHOS 5.2*    Liver Function Tests: No results for input(s): AST, ALT, ALKPHOS, BILITOT, PROT, ALBUMIN in the last 168 hours. No results for input(s): LIPASE, AMYLASE in the last 168 hours. No results for input(s): AMMONIA in the last 168 hours.  CBC: Recent Labs  Lab 06/11/20 1023  WBC 6.7  HGB 8.9*  HCT 27.8*  MCV 96.9  PLT 270    Cardiac Enzymes: No results for input(s): CKTOTAL, CKMB, CKMBINDEX, TROPONINI in the last 168 hours.  BNP: Invalid input(s): POCBNP  CBG: No results for input(s): GLUCAP in the last 168 hours.  Microbiology: Results for orders placed or performed during the hospital encounter of 04/30/20  Resp Panel by RT-PCR (Flu A&B, Covid) Nasopharyngeal Swab     Status: None   Collection Time:  04/30/20  4:01 PM   Specimen: Nasopharyngeal Swab; Nasopharyngeal(NP) swabs in vial transport medium  Result Value Ref Range Status   SARS Coronavirus 2 by RT PCR NEGATIVE NEGATIVE Final    Comment: (NOTE) SARS-CoV-2 target nucleic acids are NOT DETECTED.  The SARS-CoV-2 RNA is generally detectable in upper respiratory specimens during the acute phase of infection. The lowest concentration of SARS-CoV-2 viral copies this assay can detect is 138 copies/mL. A negative result does not preclude SARS-Cov-2 infection and should not be used as the sole basis for treatment or other patient management decisions. A negative result may occur with  improper specimen collection/handling, submission of specimen other than nasopharyngeal swab, presence of viral mutation(s) within the areas targeted by this assay, and inadequate number of viral copies(<138 copies/mL). A negative result must be combined with clinical observations, patient history, and epidemiological information. The expected result is Negative.  Fact Sheet for Patients:  EntrepreneurPulse.com.au  Fact Sheet for Healthcare Providers:  IncredibleEmployment.be  This test is no t yet approved or cleared by the [place] FDA and  has been authorized for detection and/or diagnosis of SARS-CoV-2 by FDA under an Emergency Use Authorization (EUA). This EUA will remain  in effect (meaning this test can be used) for the duration of the COVID-19 declaration under Section 564(b)(1) of the Act, 21 U.S.C.section 360bbb-3(b)(1), unless the authorization is terminated  or revoked sooner.  Influenza A by PCR NEGATIVE NEGATIVE Final   Influenza B by PCR NEGATIVE NEGATIVE Final    Comment: (NOTE) The Xpert Xpress SARS-CoV-2/FLU/RSV plus assay is intended as an aid in the diagnosis of influenza from Nasopharyngeal swab specimens and should not be used as a sole basis for treatment. Nasal washings  and aspirates are unacceptable for Xpert Xpress SARS-CoV-2/FLU/RSV testing.  Fact Sheet for Patients: EntrepreneurPulse.com.au  Fact Sheet for Healthcare Providers: IncredibleEmployment.be  This test is not yet approved or cleared by the [place] FDA and has been authorized for detection and/or diagnosis of SARS-CoV-2 by FDA under an Emergency Use Authorization (EUA). This EUA will remain in effect (meaning this test can be used) for the duration of the COVID-19 declaration under Section 564(b)(1) of the Act, 21 U.S.C. section 360bbb-3(b)(1), unless the authorization is terminated or revoked.  Performed at [facility], [address] [place], [place] [address]   Body fluid culture     Status: None   Collection Time: 05/02/20  4:44 PM   Specimen: PATH Cytology Pleural fluid  Result Value Ref Range Status   Specimen Description   Final    PLEURAL Performed at [facility], [address], [place], [place] [address]    Special Requests   Final    NONE Performed at [facility], [address] [place], [place] [address]    Gram Stain   Final    MODERATE WBC PRESENT, PREDOMINANTLY MONONUCLEAR NO ORGANISMS SEEN    Culture   Final    NO GROWTH Performed at [facility], [place] [address], [place], [place] [address]    Report Status 05/06/2020 FINAL  Final  MRSA PCR Screening     Status: None   Collection Time: 05/16/20  9:29 PM   Specimen: Nasal Mucosa; Nasopharyngeal  Result Value Ref Range Status   MRSA by PCR NEGATIVE NEGATIVE Final    Comment:        The GeneXpert MRSA Assay (FDA approved for NASAL specimens only), is one component of a comprehensive MRSA colonization surveillance program. It is not intended to diagnose MRSA infection nor to guide or monitor treatment for MRSA infections. Performed at [facility], [address] [place], [place] [address]     Coagulation  Studies: No results for input(s): LABPROT, INR in the last 72 hours.  Urinalysis: No results for input(s): COLORURINE, LABSPEC, PHURINE, GLUCOSEU, HGBUR, BILIRUBINUR, KETONESUR, PROTEINUR, UROBILINOGEN, NITRITE, LEUKOCYTESUR in the last 72 hours.  Invalid input(s): APPERANCEUR    Imaging: No results found.   Medications:   . sodium chloride     . carvedilol  12.5 mg Oral BID WC  . Chlorhexidine Gluconate Cloth  6 each Topical Q0600  . cholecalciferol  1,000 Units Oral Daily  . cloNIDine  0.1 mg Oral BID  . epoetin (EPOGEN/PROCRIT) injection  10,000 Units Intravenous Q M,W,F-HD  . feeding supplement  237 mL Oral BID BM  . heparin  5,000 Units Subcutaneous Q8H  . lidocaine  1 patch Transdermal Q24H  . melatonin  5 mg Oral QPM  . methocarbamol  500 mg Oral QID  . midodrine  10 mg Oral TID WC  . multivitamin  1 tablet Oral QHS  . polyethylene glycol  17 g Oral Daily  . senna-docusate  1 tablet Oral BID  . sevelamer carbonate  800 mg Oral TID WC  . sodium chloride flush  3 mL Intravenous Q12H  . topiramate  25 mg Oral BID  .  torsemide  10 mg Oral Daily  . traZODone  50 mg Oral QPM  . vitamin B-12  100 mcg Oral Daily   sodium chloride, acetaminophen **OR** acetaminophen, ALPRAZolam, benzonatate, bisacodyl, chlorpheniramine-HYDROcodone, ondansetron **OR** ondansetron (ZOFRAN) IV, oxyCODONE-acetaminophen, promethazine, sodium chloride flush, SUMAtriptan  Assessment/ Plan:  [name] is a 65 y.o. white female with diastolic congestive heart failure, hypertension, diabetes mellitus type II, CVA who is admitted to [facility] on 04/30/2020 for Chronic kidney disease (CKD), stage IV (severe) ([place]) [N18.4] Volume overload [E87.70]  Patient moved from [place] in December 2021. She was following with nephrology there. Patient initiated on hemodialysis this admission. Patient has a prolonged hospitalization. She was initially admitted with volume overload and acute exacerbation of  congestive heart failure. Failed IV diuretics and started on hemodialysis on 12/29. No outpatient dialysis due to patient's lack of insurance and not a resident of the state of [place]. Held dialysis for possible recovery however after several days, creatinine rising and volume accumulation. Patient then resumed on dialysis. She has been weaned down to twice a week.   1. End stage renal disease  - Changed to twice a week dialysis, Mondays and Fridays. Next hemodialysis anticipated on Friday seated in chair - will need reapplication to outpatient dialysis prior to d/c  2. Hypertension with lower extremity edema:  With autonomic orthostatics. Now off amlodipine -Improved BP control with Clonidine- patient thought she could not tolerate it but has sone well with it so far -Midodrine for low blood pressure Lower extremity edema is controlled therefore torsemide was decreased to 10 mg daily  3. Anemia of chronic kidney disease:  Lab Results  Component Value Date   HGB 8.9 (L) 06/11/2020  EPO with HD treatments  4. Secondary hyperparathyroidism Lab Results  Component Value Date   PTH 70 (H) 05/12/2020   CALCIUM 8.5 (L) 06/11/2020   PHOS 5.2 (H) 06/11/2020  - sevelamer with meals    LOS: [place] 2/3/20222:43 PM

## 2020-06-16 NOTE — Progress Notes (Signed)
Physical Therapy Treatment Patient Details Name: Katie [name] MRN: [id number] DOB: 06-Sep-1955 Today's Date: 06/16/2020    History of Present Illness Patient is a 65 y.o. female with a history of diabetes mellitus type 2, stage IV CKD, heart failure, hypertension. Patient presented secondary to dyspnea with evidence of volume overload and pleural effusion. Diagnosed with moderate pericardial effusion, mild to moderate aortic insufficiency and aortic stenosis.  S/p R heart cath 12/28.  R temp fem cath placed 12/29; 1/3 R IJ permcath placed; 1/4 R temp fem cath removed.    PT Comments    Pt was pleasant and motivated to participate during the session.  Spoke to Dr. Reesa Chew who agreed to trial of ambulation with pt immediately upon coming to standing and walking as far as pt able.  Pt came to sitting at EOB without symptoms, stood, and immediately began ambulation with +2 on either side of patient plus a chair follow for safety with pt able to amb 150' with only mild orthostatic symptoms and general fatigue.  Pt's symptoms resolved quickly in sitting with BP taken in sitting at 124/76.  Pt then ambulated 100' again with distance limited by a combination of mild dizziness and general fatigue.  Pt able to remain in standing for BP at 111/94.  Pt agreed to remain in chair at end of session with nursing present for bathing.  Pt will benefit from PT services in a SNF setting upon discharge to safely address deficits listed in patient problem list for decreased caregiver assistance and eventual return to PLOF.     Follow Up Recommendations  SNF;Supervision for mobility/OOB     Equipment Recommendations  Wheelchair (measurements PT);Wheelchair cushion (measurements PT);Rolling walker with 5" wheels;3in1 (PT)    Recommendations for Other Services       Precautions / Restrictions Precautions Precautions: Fall Precaution Comments: R IJ permcath; monitor orthostatics Restrictions Other Position/Activity  Restrictions: Pt Orthostatic, monitor BP with change of position    Mobility  Bed Mobility Overal bed mobility: Needs Assistance Bed Mobility: Supine to Sit     Supine to sit: Supervision     General bed mobility comments: Extra time and effort and use of rail  Transfers Overall transfer level: Needs assistance Equipment used: Rolling walker (2 wheeled) Transfers: Sit to/from Stand Sit to Stand: Min guard         General transfer comment: Extra time/effort to stand, but no physical assistance required  Ambulation/Gait Ambulation/Gait assistance: Min guard;+2 safety/equipment Gait Distance (Feet): 150 Feet Assistive device: Rolling walker (2 wheeled) Gait Pattern/deviations: Decreased step length - right;Decreased step length - left;Trunk flexed;Step-through pattern Gait velocity: decreased   General Gait Details: Pt able to amb 150' and then 100' with slow cadence and short B step length but was steady without LOB   Stairs             Wheelchair Mobility    Modified Rankin (Stroke Patients Only)       Balance Overall balance assessment: Needs assistance Sitting-balance support: No upper extremity supported;Feet supported Sitting balance-Leahy Scale: Good     Standing balance support: Bilateral upper extremity supported;During functional activity Standing balance-Leahy Scale: Fair Standing balance comment: Min to mod lean on the RW for support                            Cognition Arousal/Alertness: Awake/alert Behavior During Therapy: WFL for tasks assessed/performed Overall Cognitive Status: Within Functional Limits for tasks assessed  Exercises Total Joint Exercises Ankle Circles/Pumps: AROM;Both;10 reps;Strengthening Quad Sets: Strengthening;Both;10 reps Gluteal Sets: Strengthening;Both;10 reps    General Comments        Pertinent Vitals/Pain Pain Assessment: No/denies pain     Home Living                      Prior Function            PT Goals (current goals can now be found in the care plan section) Progress towards PT goals: Progressing toward goals    Frequency    Min 2X/week      PT Plan Current plan remains appropriate    Co-evaluation              AM-PAC PT "6 Clicks" Mobility   Outcome Measure  Help needed turning from your back to your side while in a flat bed without using bedrails?: A Little Help needed moving from lying on your back to sitting on the side of a flat bed without using bedrails?: A Little Help needed moving to and from a bed to a chair (including a wheelchair)?: A Little Help needed standing up from a chair using your arms (e.g., wheelchair or bedside chair)?: A Little Help needed to walk in hospital room?: A Little Help needed climbing 3-5 steps with a railing? : A Lot 6 Click Score: 17    End of Session Equipment Utilized During Treatment: Gait belt Activity Tolerance: Patient tolerated treatment well Patient left: in chair;with call bell/phone within reach;with chair alarm set;with nursing/sitter in room Nurse Communication: Mobility status;Other (comment) (BP response with ambulation) PT Visit Diagnosis: Unsteadiness on feet (R26.81);Muscle weakness (generalized) (M62.81);History of falling (Z91.81);Other abnormalities of gait and mobility (R26.89)     Time: [id number] PT Time Calculation (min) (ACUTE ONLY): 27 min  Charges:  $Gait Training: 8-22 mins $Therapeutic Activity: 8-22 mins                     D. Scott Jakarius Flamenco PT, DPT 06/16/20, 1:09 PM

## 2020-06-16 NOTE — TOC Progression Note (Signed)
Transition of Care [facility]) - Progression Note    Patient Details  Name: Kaeya Schiffer MRN: [id number] Date of Birth: 11/05/1955  Transition of Care [facility]) CM/SW [place], LCSW Phone Number: 06/16/2020, 10:46 AM  Clinical Narrative:   TOC still trying to find LOG SNF rehab placement for patient.     Expected Discharge Plan: Skilled Nursing Facility Barriers to Discharge: Continued Medical Work up,Homeless with medical needs  Expected Discharge Plan and Services Expected Discharge Plan: [place] In-house Referral: Clinical Social Work Discharge Planning Services: Homebound not met per provider Post Acute Care Choice: [place] Living arrangements for the past 2 months: No permanent address (Lived with Sister and family)                 DME Arranged: N/A DME Agency: NA       HH Arranged: NA HH Agency: NA         Social Determinants of Health (SDOH) Interventions    Readmission Risk Interventions No flowsheet data found.

## 2020-06-16 NOTE — Progress Notes (Signed)
PROGRESS NOTE    Katie [name]  [id number] DOB: 1955/05/22 DOA: 04/30/2020 PCP: Patient, [name]   Brief Narrative: Taken from prior notes. Doryce Alberto a 65 y.o.female with a history of diabetes mellitus type 2, stage IV CKD, heart failure, hypertension. Patient presented secondary to dyspnea with evidence of volume overload and pleural effusion.  Patient has had an extended hospital course. Her acute medical issues have essentially resolved at this point. She is medically stable for discharge however we do not have a safe disposition plan. Patient does not have stable living and lives with a friend here. She states that the friend likely will not be able to care for her.  TOC working to find placement however multiple facilities have refused at this point. Senior case management involved. Expanding search.  Patient remains stable.Continues to be very unmotivated always having pain complaints when PT attempted to work with her however then later complains of buttock pain from sitting too much. Been reinforce the need for exertion and ambulation daily basis.  Pt also very orthostatic and having orthostatics checked daily.remained positive for orthostasis despite starting on midodrine and TED hose, will try abdominal binder, most likely autonomic dysfunction.   Subjective: Patient was resting comfortably when seen today.  Her headache resolved with migraine cocktail given to her yesterday. She was able to walk with PT today, continue to have positive orthostatic vitals but able to participate with PT.  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)   Hyperkalemia   Pressure injury of skin   Pleural effusion   Acute heart failure with preserved ejection fraction (HFpEF) (HCC)   Chronic kidney disease  (CKD), stage IV (severe) ([place])   Pulmonary hypertension, unspecified (HCC)   ATN (acute tubular necrosis) (HCC)  HFpEF.  Echo done in December 2021 with normal EF and grade 2 diastolic dysfunction.Associated RV systolic dysfunction with elevated PA pressure seen on TTE although RHC on 12/28 significant for mildly elevated right heart/pulmonary artery pressures.    She was initially admitted with volume overload.  Initially managed with IV Lasix, progress to ESRD requiring dialysis, currently volume is being managed with dialysis.  Appears euvolemic. Difficult disposition due to unsafe discharge, apparently she was like living with a friend who cannot take care of her anymore.  TOC is working for places accepting facility. -Continue with dialysis. -Continue with torsemide.  Pleural effusion In setting of fluid overload with new diagnosis of grade 2 diastolic dysfunction  Echo 05/01/20 with nml EF S/p diagnostic Rt thoracentesis performed on 12/20 No evidence of maligancy on cytology  Diet controlled diabetes mellitus, type II.  CBG well controlled and A1c of 4.7 which is against the diagnosis of diabetes. -Currently diet controlled -Continue to monitor.  Orthostatic hypotension.  Patient remained significantly orthostatic positive, most likely autonomic dysfunction.  Baseline blood pressure mildly elevated today. -Continue with midodrine -Continue with TED hose -Can try abdominal binder -Continue to monitor  ESRD-MWF- Unknown baseline. 2/2 HTN and atherosclerosis Hx/o NSADS Patient is unsure of her primary nephrologist in [place].  Creatinine of 2.97 on admission.  Per patient, she was planned to start hemodialysis in addition to consideration for renal transplant.  Nephrology consulted on 12/20.  Worsening renal function in setting of attempted Lasix diuresis for heart failure.  She was started  on dialysis by nephrology. Patient on midodrine 10 mg on dialysis days due to  orthostatic hypotension which she becomes very symptomatic Midodrine 5 mg p.o. 3 times daily started on on 1/22 besides the 10 mg given on dialysis day Expected [place] Pending financial clearance for discharge planning TOC attempting disposition to SNF Nephrology following.  Headache History of chronic headache. Patient states she takes Imitrex as needed.  Continue Tylenol and oxycodone as needed 1/8 s/p Fioricet 1 dose 1/9 IV Solu-Medrol 60 mg x 1 dose, Benadryl 25 mg IV x1 dose and Phenergan 25 mg IV x1 dose given 2/2.  give her one-time dose of Toradol, Benadryl and Compazine.  Moderate pericardial effusion No clinical evidence of tamponade.  No recent infections.  In setting of CKD V with elevated BUN. No chest pain. -cardiology rec. Outpatient Echo for surveillance Hemodynamics stable , continue to monitor  Mild/moderate aortic stenosis Unsure if this is symptomatic.  Patient does have dyspnea with exertion but this may be multifactorial. -Cardiology recommendations: outpatient Transthoracic Echocardiogram for surveillance.  Essential hypertension.  Blood pressure within goal today. On multiple meds which include carvedilol, losartan, torsemide along with midodrine to see if that will help with her positive orthostatic vitals. Patient needs midodrine during dialysis. -Losartan was discontinued and she was started on clonidine by nephrology  -Continue rest of the medications and monitor.  Anemia Possible related to underlying kidney disease.  Evidence of macrocytosis on CBC.  TIBC low but otherwise normal iron panel. Vitamin B12 level 358, target > 400, started oral supplement and folate normal.  Pressure injury Mid coccyx, POA  History of tobacco use Patient has quit for at least 10 years. Previously, 20 pack year history.  Insomnia, started trazodone and melatonin Continue melatonin and trazodone nightly,seems to be helping patient to  sleep  Obesity Body mass index is 30.91 kg/m.  Objective: Vitals:   06/15/20 2330 06/16/20 0453 06/16/20 1030 06/16/20 1100  BP: 132/65 (!) 158/67 124/76 (!) 143/78  Pulse: 63 65 73   Resp: '16 16 17   '$ Temp: 98.6 F (37 C) 98.3 F (36.8 C) 98.5 F (36.9 C)   TempSrc: Oral Oral Oral   SpO2: 98% 93% 98%   Weight:      Height:        Intake/Output Summary (Last 24 hours) at 06/16/2020 1203 Last data filed at 06/16/2020 0453 Gross per 24 hour  Intake --  Output 0 ml  Net 0 ml   Filed Weights   06/10/20 0500 06/11/20 0658 06/14/20 0700  Weight: 77.9 kg 76.7 kg 76.1 kg    Examination:  General.  Well-developed lady, lying with blankets overhead, appears in pain. Pulmonary.  Lungs clear bilaterally, normal respiratory effort. CV.  Regular rate and rhythm, no JVD, rub or murmur. Abdomen.  Soft, nontender, nondistended, BS positive. CNS.  Alert and oriented x3.  No focal neurologic deficit. Extremities.  No edema, no cyanosis, pulses intact and symmetrical. Psychiatry.  Judgment and insight appears normal.  DVT prophylaxis: Heparin Code Status: Full Family Communication: Discussed with patient Disposition Plan:  Status is: Inpatient  Remains inpatient appropriate because:Unsafe d/c plan, TOC supervisor is involved as she still does not has any place who will take LOG from [facility].   Dispo: The patient is from: Home              Anticipated d/c is to: SNF              Anticipated d/c date  is: 1 day              Patient currently is medically stable to d/c.   Difficult to place patient Yes              Level of care: Med-Surg  Consultants:   Nephrology  Procedures:  Antimicrobials:   Data Reviewed: I have personally reviewed following labs and imaging studies  CBC: Recent Labs  Lab 06/11/20 1023  WBC 6.7  HGB 8.9*  HCT 27.8*  MCV 96.9  PLT [id number]   Basic Metabolic Panel: Recent Labs  Lab 06/11/20 1023  NA 135  K 4.2  CL 100  CO2 26  GLUCOSE 134*  BUN  45*  CREATININE 2.79*  CALCIUM 8.5*  PHOS 5.2*   GFR: Estimated Creatinine Clearance: 21.8 mL/min (A) (by C-G formula based on SCr of 2.79 mg/dL (H)). Liver Function Tests: No results for input(s): AST, ALT, ALKPHOS, BILITOT, PROT, ALBUMIN in the last 168 hours. No results for input(s): LIPASE, AMYLASE in the last 168 hours. No results for input(s): AMMONIA in the last 168 hours. Coagulation Profile: No results for input(s): INR, PROTIME in the last 168 hours. Cardiac Enzymes: No results for input(s): CKTOTAL, CKMB, CKMBINDEX, TROPONINI in the last 168 hours. BNP (last 3 results) No results for input(s): PROBNP in the last 8760 hours. HbA1C: No results for input(s): HGBA1C in the last 72 hours. CBG: No results for input(s): GLUCAP in the last 168 hours. Lipid Profile: No results for input(s): CHOL, HDL, LDLCALC, TRIG, CHOLHDL, LDLDIRECT in the last 72 hours. Thyroid Function Tests: No results for input(s): TSH, T4TOTAL, FREET4, T3FREE, THYROIDAB in the last 72 hours. Anemia Panel: No results for input(s): VITAMINB12, FOLATE, FERRITIN, TIBC, IRON, RETICCTPCT in the last 72 hours. Sepsis Labs: No results for input(s): PROCALCITON, LATICACIDVEN in the last 168 hours.  No results found for this or any previous visit (from the past 240 hour(s)).   Radiology Studies: No results found.  Scheduled Meds: . carvedilol  12.5 mg Oral BID WC  . Chlorhexidine Gluconate Cloth  6 each Topical Q0600  . cholecalciferol  1,000 Units Oral Daily  . cloNIDine  0.1 mg Oral BID  . epoetin (EPOGEN/PROCRIT) injection  10,000 Units Intravenous Q M,W,F-HD  . feeding supplement  237 mL Oral BID BM  . heparin  5,000 Units Subcutaneous Q8H  . lidocaine  1 patch Transdermal Q24H  . melatonin  5 mg Oral QPM  . methocarbamol  500 mg Oral QID  . midodrine  10 mg Oral TID WC  . multivitamin  1 tablet Oral QHS  . polyethylene glycol  17 g Oral Daily  . senna-docusate  1 tablet Oral BID  . sevelamer  carbonate  800 mg Oral TID WC  . sodium chloride flush  3 mL Intravenous Q12H  . topiramate  25 mg Oral BID  . torsemide  10 mg Oral Daily  . traZODone  50 mg Oral QPM  . vitamin B-12  100 mcg Oral Daily   Continuous Infusions: . sodium chloride       LOS: 46 days   Time spent: 20 minutes.  Lorella Nimrod, MD [facility]  If 7PM-7AM, please contact night-coverage [email or web address]  06/16/2020, 12:03 PM   This record has been created using Systems analyst. Errors have been sought and corrected,but may not always be located. Such creation errors do not reflect on the standard of care.

## 2020-06-17 DIAGNOSIS — R531 Weakness: Secondary | ICD-10-CM

## 2020-06-17 LAB — RENAL FUNCTION PANEL
Albumin: 2.5 g/dL — ABNORMAL LOW (ref 3.5–5.0)
Anion gap: 7 (ref 5–15)
BUN: 21 mg/dL (ref 8–23)
CO2: 28 mmol/L (ref 22–32)
Calcium: 7.9 mg/dL — ABNORMAL LOW (ref 8.9–10.3)
Chloride: 103 mmol/L (ref 98–111)
Creatinine, Ser: 1.33 mg/dL — ABNORMAL HIGH (ref 0.44–1.00)
GFR, Estimated: 45 mL/min — ABNORMAL LOW
Glucose, Bld: 105 mg/dL — ABNORMAL HIGH (ref 70–99)
Phosphorus: 2.9 mg/dL (ref 2.5–4.6)
Potassium: 3.7 mmol/L (ref 3.5–5.1)
Sodium: 138 mmol/L (ref 135–145)

## 2020-06-17 LAB — CBC
HCT: 27.7 % — ABNORMAL LOW (ref 36.0–46.0)
Hemoglobin: 8.8 g/dL — ABNORMAL LOW (ref 12.0–15.0)
MCH: 30.7 pg (ref 26.0–34.0)
MCHC: 31.8 g/dL (ref 30.0–36.0)
MCV: 96.5 fL (ref 80.0–100.0)
Platelets: 262 K/uL (ref 150–400)
RBC: 2.87 MIL/uL — ABNORMAL LOW (ref 3.87–5.11)
RDW: 14.1 % (ref 11.5–15.5)
WBC: 5.9 K/uL (ref 4.0–10.5)
nRBC: 0 % (ref 0.0–0.2)

## 2020-06-17 MED ORDER — BUTALBITAL-APAP-CAFFEINE 50-325-40 MG PO TABS
2.0000 | ORAL_TABLET | Freq: Once | ORAL | Status: AC
  Administered 2020-06-17: 2 via ORAL
  Filled 2020-06-17: qty 2

## 2020-06-17 MED ORDER — TRAMADOL HCL 50 MG PO TABS
50.0000 mg | ORAL_TABLET | Freq: Four times a day (QID) | ORAL | Status: DC | PRN
  Administered 2020-06-19 – 2020-06-21 (×8): 50 mg via ORAL
  Filled 2020-06-17 (×9): qty 1

## 2020-06-17 NOTE — Progress Notes (Signed)
Mobility Specialist - Progress Note   06/17/20 1100  Mobility  Activity Off unit  Mobility performed by Mobility specialist    Pt being transferred off-unit for HD treatment at time of attempt. Will hold off and attempt session at another date/time as time permits.    Kathee Delton Mobility Specialist 06/17/20, 11:44 AM

## 2020-06-17 NOTE — Progress Notes (Signed)
PROGRESS NOTE    Katie [name]  [id number] DOB: 1955/08/05 DOA: 04/30/2020 PCP: Patient, [name]   Brief Narrative: Taken from prior notes. Katie [name] a 65 y.o.female with a history of diabetes mellitus type 2, stage IV CKD, heart failure, hypertension. Patient presented secondary to dyspnea with evidence of volume overload and pleural effusion.  Patient has had an extended hospital course. Her acute medical issues have essentially resolved at this point. She is medically stable for discharge however we do not have a safe disposition plan. Patient does not have stable living and lives with a friend here. She states that the friend likely will not be able to care for her.  TOC working to find placement however multiple facilities have refused at this point. Senior case management involved. Expanding search.  Patient remains stable.Continues to be very unmotivated always having pain complaints when PT attempted to work with her however then later complains of buttock pain from sitting too much. Been reinforce the need for exertion and ambulation daily basis.  Pt also very orthostatic and having orthostatics checked daily.remained positive for orthostasis despite starting on midodrine and TED hose, will try abdominal binder, most likely autonomic dysfunction.   Subjective: Pt had dialysis today, and reported headache afterwards.  Pt said she walked today, felt a bit dizzy, but made it through.     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)   Hyperkalemia   Pressure injury of skin   Pleural effusion   Acute heart failure with preserved ejection fraction (HFpEF) (HCC)   Chronic kidney disease (CKD), stage IV (severe) ([place])   Pulmonary hypertension, unspecified (HCC)   ATN (acute tubular  necrosis) (HCC)  HFpEF.  Echo done in December 2021 with normal EF and grade 2 diastolic dysfunction.Associated RV systolic dysfunction with elevated PA pressure seen on TTE although RHC on 12/28 significant for mildly elevated right heart/pulmonary artery pressures.    She was initially admitted with volume overload.  Initially managed with IV Lasix, progress to ESRD requiring dialysis, currently volume is being managed with dialysis.  Appears euvolemic. Difficult disposition due to unsafe discharge, apparently she was like living with a friend who cannot take care of her anymore.  TOC is working for places accepting facility. -Continue with dialysis. -Continue with torsemide.  Pleural effusion In setting of fluid overload with new diagnosis of grade 2 diastolic dysfunction  Echo 05/01/20 with nml EF S/p diagnostic Rt thoracentesis performed on 12/20 No evidence of maligancy on cytology  Diet controlled diabetes mellitus, type II.  CBG well controlled and A1c of 4.7 which is against the diagnosis of diabetes. -Currently diet controlled -Continue to monitor.  Orthostatic hypotension.  Patient remained significantly orthostatic positive, most likely autonomic dysfunction.  Baseline blood pressure mildly elevated today. -Continue with midodrine -Continue with TED hose -Can try abdominal binder -Continue to monitor  ESRD-MWF- Unknown baseline. 2/2 HTN and atherosclerosis Hx/o NSADS Patient is unsure of her primary nephrologist in [place].  Creatinine of 2.97 on admission.  Per patient, she was planned to start hemodialysis in addition to consideration for renal transplant.  Nephrology consulted on 12/20.  Worsening renal function in setting of attempted Lasix diuresis for heart failure.  She was started on dialysis by nephrology. Patient on midodrine 10 mg on dialysis days due  to orthostatic hypotension which she becomes very symptomatic Midodrine 5 mg p.o. 3 times daily started on  on 1/22 besides the 10 mg given on dialysis day Expected [place] Pending financial clearance for discharge planning TOC attempting disposition to SNF Nephrology following.  Headache History of chronic headache. Patient states she takes Imitrex as needed.  Continue Tylenol and oxycodone as needed 1/8 s/p Fioricet 1 dose 1/9 IV Solu-Medrol 60 mg x 1 dose, Benadryl 25 mg IV x1 dose and Phenergan 25 mg IV x1 dose given 2/2.  give her one-time dose of Toradol, Benadryl and Compazine.  Moderate pericardial effusion No clinical evidence of tamponade.  No recent infections.  In setting of CKD V with elevated BUN. No chest pain. -cardiology rec. Outpatient Echo for surveillance Hemodynamics stable , continue to monitor  Mild/moderate aortic stenosis Unsure if this is symptomatic.  Patient does have dyspnea with exertion but this may be multifactorial. -Cardiology recommendations: outpatient Transthoracic Echocardiogram for surveillance.  Essential hypertension.  Blood pressure within goal today. On multiple meds which include carvedilol, losartan, torsemide along with midodrine to see if that will help with her positive orthostatic vitals. Patient needs midodrine during dialysis. -Losartan was discontinued and she was started on clonidine by nephrology  -Continue rest of the medications and monitor.  Anemia Possible related to underlying kidney disease.  Evidence of macrocytosis on CBC.  TIBC low but otherwise normal iron panel. Vitamin B12 level 358, target > 400, started oral supplement and folate normal.  Pressure injury Mid coccyx, POA  History of tobacco use Patient has quit for at least 10 years. Previously, 20 pack year history.  Insomnia, started trazodone and melatonin Continue melatonin and trazodone nightly,seems to be helping patient to sleep  Obesity Body mass index is 30.91 kg/m.  Objective: Vitals:   06/17/20 1300 06/17/20 1315 06/17/20  1330 06/17/20 1429  BP: (!) 159/74 (!) 170/78 135/85 139/79  Pulse: 65 69 72 71  Resp: '10 14 18   '$ Temp:      TempSrc:      SpO2:    97%  Weight:      Height:        Intake/Output Summary (Last 24 hours) at 06/17/2020 1528 Last data filed at 06/17/2020 1330 Gross per 24 hour  Intake 246 ml  Output 1015 ml  Net -769 ml   Filed Weights   06/10/20 0500 06/11/20 0658 06/14/20 0700  Weight: 77.9 kg 76.7 kg 76.1 kg    Examination:  Constitutional: NAD, AAOx3 HEENT: conjunctivae and lids normal, EOMI CV: No cyanosis.   RESP: normal respiratory effort, on RA Extremities: No effusions, edema in BLE SKIN: warm, dry Neuro: II - XII grossly intact.   Psych: Normal mood and affect.  Appropriate judgement and reason   DVT prophylaxis: Heparin Code Status: Full Family Communication:  Disposition Plan:  Status is: Inpatient  Remains inpatient appropriate because:Unsafe d/c plan, TOC supervisor is involved as she still does not has any place who will take LOG from [facility].   Dispo: The patient is from: Home              Anticipated d/c is to: SNF              Anticipated d/c date is: whenever SNF bed available              Patient currently is medically stable to d/c.   Difficult to place patient Yes  Level of care: Med-Surg  Consultants:   Nephrology  Procedures:  Antimicrobials:   Data Reviewed: I have personally reviewed following labs and imaging studies  CBC: Recent Labs  Lab 06/11/20 1023 06/17/20 1220  WBC 6.7 5.9  HGB 8.9* 8.8*  HCT 27.8* 27.7*  MCV 96.9 96.5  PLT 270 [id number]   Basic Metabolic Panel: Recent Labs  Lab 06/11/20 1023 06/17/20 1220  NA 135 138  K 4.2 3.7  CL 100 103  CO2 26 28  GLUCOSE 134* 105*  BUN 45* 21  CREATININE 2.79* 1.33*  CALCIUM 8.5* 7.9*  PHOS 5.2* 2.9   GFR: Estimated Creatinine Clearance: 45.7 mL/min (A) (by C-G formula based on SCr of 1.33 mg/dL (H)). Liver Function Tests: Recent Labs  Lab 06/17/20 1220   ALBUMIN 2.5*   No results for input(s): LIPASE, AMYLASE in the last 168 hours. No results for input(s): AMMONIA in the last 168 hours. Coagulation Profile: No results for input(s): INR, PROTIME in the last 168 hours. Cardiac Enzymes: No results for input(s): CKTOTAL, CKMB, CKMBINDEX, TROPONINI in the last 168 hours. BNP (last 3 results) No results for input(s): PROBNP in the last 8760 hours. HbA1C: No results for input(s): HGBA1C in the last 72 hours. CBG: No results for input(s): GLUCAP in the last 168 hours. Lipid Profile: No results for input(s): CHOL, HDL, LDLCALC, TRIG, CHOLHDL, LDLDIRECT in the last 72 hours. Thyroid Function Tests: No results for input(s): TSH, T4TOTAL, FREET4, T3FREE, THYROIDAB in the last 72 hours. Anemia Panel: No results for input(s): VITAMINB12, FOLATE, FERRITIN, TIBC, IRON, RETICCTPCT in the last 72 hours. Sepsis Labs: No results for input(s): PROCALCITON, LATICACIDVEN in the last 168 hours.  No results found for this or any previous visit (from the past 240 hour(s)).   Radiology Studies: No results found.  Scheduled Meds: . carvedilol  12.5 mg Oral BID WC  . Chlorhexidine Gluconate Cloth  6 each Topical Q0600  . cholecalciferol  1,000 Units Oral Daily  . cloNIDine  0.1 mg Oral BID  . epoetin (EPOGEN/PROCRIT) injection  10,000 Units Intravenous Q M,W,F-HD  . feeding supplement  237 mL Oral BID BM  . heparin  5,000 Units Subcutaneous Q8H  . lidocaine  1 patch Transdermal Q24H  . melatonin  5 mg Oral QPM  . methocarbamol  500 mg Oral QID  . multivitamin  1 tablet Oral QHS  . polyethylene glycol  17 g Oral Daily  . senna-docusate  1 tablet Oral BID  . sevelamer carbonate  800 mg Oral TID WC  . sodium chloride flush  3 mL Intravenous Q12H  . topiramate  25 mg Oral BID  . torsemide  10 mg Oral Daily  . traZODone  50 mg Oral QPM  . vitamin B-12  100 mcg Oral Daily   Continuous Infusions: . sodium chloride       LOS: 47 days     Enzo Bi, MD [facility]  If 7PM-7AM, please contact night-coverage [email or web address]  06/17/2020, 3:28 PM

## 2020-06-17 NOTE — TOC Progression Note (Signed)
Transition of Care [facility]) - Progression Note    Patient Details  Name: Katie [name] MRN: [id number] Date of Birth: Oct 26, 1955  Transition of Care [facility] CM/SW [place], LCSW Phone Number: 06/17/2020, 9:56 AM  Clinical Narrative:       Spoke to Financial Counselor [place]. Amy reported she is meeting with patient today to confirm that patient's [facility] was cancelled. If it was, they can start the [facility] application today.     Expected Discharge Plan: Skilled Nursing Facility Barriers to Discharge: Continued Medical Work up,Homeless with medical needs  Expected Discharge Plan and Services Expected Discharge Plan: [place] In-house Referral: Clinical Social Work Discharge Planning Services: Homebound not met per provider Post Acute Care Choice: [place] Living arrangements for the past 2 months: No permanent address (Lived with Sister and family)                 DME Arranged: N/A DME Agency: NA       HH Arranged: NA HH Agency: NA         Social Determinants of Health (SDOH) Interventions    Readmission Risk Interventions No flowsheet data found.

## 2020-06-17 NOTE — Progress Notes (Signed)
[facility], [name] 06/17/20  Subjective:   LOS: 20  Patient seen in hemodialysis, currently in bed Patient has no acute concerns Denies headache.  Able to eat breakfast.    HEMODIALYSIS FLOWSHEET:  Blood Flow Rate (mL/min): 400 mL/min Arterial Pressure (mmHg): -150 mmHg Venous Pressure (mmHg): 120 mmHg Transmembrane Pressure (mmHg): 50 mmHg Ultrafiltration Rate (mL/min): 170 mL/min Dialysate Flow Rate (mL/min): 500 ml/min Conductivity: Machine : 14 Conductivity: Machine : 14 Dialysis Fluid Bolus: Normal Saline Bolus Amount (mL): 250 mL Dialysate Change: Other (comment)   Objective:  Vital signs in last 24 hours:  Temp:  [97.8 F (36.6 C)-98.7 F (37.1 C)] 98.4 F (36.9 C) (02/04 1017) Pulse Rate:  [64-71] 69 (02/04 1315) Resp:  [0-18] 14 (02/04 1315) BP: (128-201)/(63-82) 170/78 (02/04 1315) SpO2:  [96 %-99 %] 97 % (02/04 0720)  Weight change:  Filed Weights   06/10/20 0500 06/11/20 0658 06/14/20 0700  Weight: 77.9 kg 76.7 kg 76.1 kg    Intake/Output:    Intake/Output Summary (Last 24 hours) at 06/17/2020 1342 Last data filed at 06/17/2020 0954 Gross per 24 hour  Intake 246 ml  Output 1015 ml  Net -769 ml     Physical Exam: General: Laying in bed, NAD  HEENT Nomocephalic, atraumatic  Pulm/lungs Clear, room air  CVS/Heart regular  Abdomen:  soft  Extremities: Trace edema  Neurologic: Alert, oriented  Skin: No rashes or masses  Access: RIJ permcath        Basic Metabolic Panel:  Recent Labs  Lab 06/11/20 1023 06/17/20 1220  NA 135 138  K 4.2 3.7  CL 100 103  CO2 26 28  GLUCOSE 134* 105*  BUN 45* 21  CREATININE 2.79* 1.33*  CALCIUM 8.5* 7.9*  PHOS 5.2* 2.9     CBC: Recent Labs  Lab 06/11/20 1023 06/17/20 1220  WBC 6.7 5.9  HGB 8.9* 8.8*  HCT 27.8* 27.7*  MCV 96.9 96.5  PLT 270 262      Lab Results  Component Value Date   HEPBSAG NON REACTIVE 06/10/2020   HEPBSAB NON REACTIVE 05/16/2020    HEPBIGM NON REACTIVE 05/16/2020      Microbiology:  No results found for this or any previous visit (from the past 240 hour(s)).  Coagulation Studies: No results for input(s): LABPROT, INR in the last 72 hours.  Urinalysis: No results for input(s): COLORURINE, LABSPEC, PHURINE, GLUCOSEU, HGBUR, BILIRUBINUR, KETONESUR, PROTEINUR, UROBILINOGEN, NITRITE, LEUKOCYTESUR in the last 72 hours.  Invalid input(s): APPERANCEUR    Imaging: No results found.   Medications:   . sodium chloride     . carvedilol  12.5 mg Oral BID WC  . Chlorhexidine Gluconate Cloth  6 each Topical Q0600  . cholecalciferol  1,000 Units Oral Daily  . cloNIDine  0.1 mg Oral BID  . epoetin (EPOGEN/PROCRIT) injection  10,000 Units Intravenous Q M,W,F-HD  . feeding supplement  237 mL Oral BID BM  . heparin  5,000 Units Subcutaneous Q8H  . lidocaine  1 patch Transdermal Q24H  . melatonin  5 mg Oral QPM  . methocarbamol  500 mg Oral QID  . multivitamin  1 tablet Oral QHS  . polyethylene glycol  17 g Oral Daily  . senna-docusate  1 tablet Oral BID  . sevelamer carbonate  800 mg Oral TID WC  . sodium chloride flush  3 mL Intravenous Q12H  . topiramate  25 mg Oral BID  . torsemide  10 mg Oral Daily  . traZODone  50 mg Oral QPM  . vitamin B-12  100 mcg Oral Daily   sodium chloride, acetaminophen **OR** acetaminophen, ALPRAZolam, benzonatate, bisacodyl, chlorpheniramine-HYDROcodone, ondansetron **OR** ondansetron (ZOFRAN) IV, oxyCODONE-acetaminophen, promethazine, sodium chloride flush, SUMAtriptan  Assessment/ Plan:  65 y.o. female with  diastolic congestive heart failure, hypertension, diabetes mellitus type II, CVA was admitted on 04/30/2020 for  Principal Problem:   Volume overload Active Problems:   Type 2 diabetes mellitus with stage 5 chronic kidney disease (HCC)   Hypertension   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HCC)   Hyperkalemia   Pressure injury of skin   Pleural effusion   Acute  heart failure with preserved ejection fraction (HFpEF) (HCC)   Chronic kidney disease (CKD), stage IV (severe) ([place])   Pulmonary hypertension, unspecified (HCC)   ATN (acute tubular necrosis) (HCC)  Chronic kidney disease (CKD), stage IV (severe) ([place]) [N18.4] Volume overload [E87.70]   Patient moved from [place] in December 2021. She was following with nephrology there. Patient initiated on hemodialysis this admission. Patient has a prolonged hospitalization. She was initially admitted with volume overload and acute exacerbation of congestive heart failure. Failed IV diuretics and started on hemodialysis on 12/29. No outpatient dialysis due to patient's lack of insurance and not a resident of the state of [place]. Held dialysis for possible recovery however after several days, creatinine rising and volume accumulation. Patient then resumed on dialysis. She has been weaned down to twice a week  #. ESRD -Hemodialysis completed today. -Next HD treatment will be on Monday, seated in a chair -will need reapplication for outpatient HD  #Hypertension with lower extremity edema -improved BP control with clonidine -Midodrine for low blood pressure  -compression stockings during the day - able to a,bulate in hallways without orthostatic symptoms  #. Anemia of CKD  Lab Results  Component Value Date   HGB 8.8 (L) 06/17/2020   Low dose EPO 10,000 units with HD  #. Secondary hyperparathyroidism of renal origin N 25.81  -Selevamer given with meals.    Component Value Date/Time   PTH 70 (H) 05/12/2020 0946   Lab Results  Component Value Date   PHOS 2.9 06/17/2020   Monitor calcium and phos level    LOS: [place] 2/4/20221:42 PM   Patient was seen and evaluated with Colon Flattery, NP. She assisted with transcription of the note.   [address] [place], [place]

## 2020-06-17 NOTE — Progress Notes (Signed)
Physical Therapy Treatment Patient Details Name: Katie [name] MRN: [id number] DOB: 15-Nov-1955 Today's Date: 06/17/2020    History of Present Illness Patient is a 65 y.o. female with a history of diabetes mellitus type 2, stage IV CKD, heart failure, hypertension. Patient presented secondary to dyspnea with evidence of volume overload and pleural effusion. Diagnosed with moderate pericardial effusion, mild to moderate aortic insufficiency and aortic stenosis.  S/p R heart cath 12/28.  R temp fem cath placed 12/29; 1/3 R IJ permcath placed; 1/4 R temp fem cath removed.    PT Comments    Patient received in bed, blanket over her head. Had dialysis today and reports headache. She is agreeable to PT session. Patient mod independent with bed mobility. Required assistance to clean up prior to ambulation as she had BM in the bed and was unaware. Patient then ambulated 160 feet with seated rest after 80 feet using RW and close chair to follow. She required seated break due to sudden onset of cold sweat. Patient reports she is not feeling as well today, possibly due to HD. Patient will continue to benefit from skilled PT while here to improve functional independence, strength and safety. She is motivated to improve.      Follow Up Recommendations  Supervision for mobility/OOB;SNF     Equipment Recommendations  Wheelchair (measurements PT);Wheelchair cushion (measurements PT);Rolling walker with 5" wheels;3in1 (PT)    Recommendations for Other Services       Precautions / Restrictions Precautions Precautions: Fall    Mobility  Bed Mobility Overal bed mobility: Modified Independent Bed Mobility: Supine to Sit;Sit to Supine     Supine to sit: Modified independent (Device/Increase time) Sit to supine: Modified independent (Device/Increase time)   General bed mobility comments: Extra time and effort and use of rail, patient soiled upon removing pure wick. She was unaware.  Transfers Overall  transfer level: Needs assistance Equipment used: Rolling walker (2 wheeled) Transfers: Sit to/from Stand Sit to Stand: Min assist;From elevated surface         General transfer comment: Patient require bed to be elevated for sit to stand. Able to stand for me to clean her up, then sat again prior to ambulation.  Ambulation/Gait Ambulation/Gait assistance: Min guard;+2 safety/equipment Gait Distance (Feet): 160 Feet Assistive device: Rolling walker (2 wheeled) Gait Pattern/deviations: Step-through pattern;Decreased stride length Gait velocity: slightly decreased   General Gait Details: Patient ambulated approx 80 feet then became sweaty requiring seated rest break. After rest she was able to ambulate another 80 feet.  Close chair to follow for safety.   Stairs             Wheelchair Mobility    Modified Rankin (Stroke Patients Only)       Balance Overall balance assessment: Needs assistance Sitting-balance support: Feet supported Sitting balance-Leahy Scale: Good     Standing balance support: Bilateral upper extremity supported;During functional activity Standing balance-Leahy Scale: Good Standing balance comment: reliant on RW but no outside physical assist                            Cognition Arousal/Alertness: Awake/alert Behavior During Therapy: WFL for tasks assessed/performed Overall Cognitive Status: Within Functional Limits for tasks assessed                                 General Comments: Pt is A and O x 4.  cooperative and pleasant throughout      Exercises      General Comments        Pertinent Vitals/Pain Pain Location: headache Pain Descriptors / Indicators: Headache Pain Intervention(s): Monitored during session    Home Living                      Prior Function            PT Goals (current goals can now be found in the care plan section) Acute Rehab PT Goals Patient Stated Goal: to go home PT  Goal Formulation: With patient Time For Goal Achievement: 07/01/20 Potential to Achieve Goals: Fair Progress towards PT goals: Progressing toward goals    Frequency    Min 2X/week      PT Plan Current plan remains appropriate    Co-evaluation              AM-PAC PT "6 Clicks" Mobility   Outcome Measure  Help needed turning from your back to your side while in a flat bed without using bedrails?: None Help needed moving from lying on your back to sitting on the side of a flat bed without using bedrails?: None Help needed moving to and from a bed to a chair (including a wheelchair)?: A Little Help needed standing up from a chair using your arms (e.g., wheelchair or bedside chair)?: A Little Help needed to walk in hospital room?: A Little Help needed climbing 3-5 steps with a railing? : A Lot 6 Click Score: 19    End of Session Equipment Utilized During Treatment: Gait belt Activity Tolerance: Patient tolerated treatment well Patient left: in bed;with call bell/phone within reach;with bed alarm set Nurse Communication: Mobility status PT Visit Diagnosis: Unsteadiness on feet (R26.81);Muscle weakness (generalized) (M62.81);History of falling (Z91.81);Other abnormalities of gait and mobility (R26.89)     Time: [id number] PT Time Calculation (min) (ACUTE ONLY): 11 min  Charges:  $Gait Training: 8-22 mins                     Verlia Kaney, PT, GCS 06/17/20,4:10 PM

## 2020-06-18 MED ORDER — PROCHLORPERAZINE EDISYLATE 10 MG/2ML IJ SOLN
10.0000 mg | Freq: Once | INTRAMUSCULAR | Status: DC
  Filled 2020-06-18: qty 2

## 2020-06-18 MED ORDER — CLONIDINE HCL 0.1 MG PO TABS
0.1000 mg | ORAL_TABLET | Freq: Three times a day (TID) | ORAL | Status: DC
  Administered 2020-06-18 – 2020-06-21 (×7): 0.1 mg via ORAL
  Filled 2020-06-18 (×7): qty 1

## 2020-06-18 MED ORDER — HYDRALAZINE HCL 50 MG PO TABS
50.0000 mg | ORAL_TABLET | Freq: Four times a day (QID) | ORAL | Status: DC
  Administered 2020-06-18 – 2020-06-19 (×5): 50 mg via ORAL
  Filled 2020-06-18 (×5): qty 1

## 2020-06-18 MED ORDER — DIPHENHYDRAMINE HCL 25 MG PO CAPS
50.0000 mg | ORAL_CAPSULE | Freq: Once | ORAL | Status: AC
  Administered 2020-06-18: 50 mg via ORAL
  Filled 2020-06-18: qty 2

## 2020-06-18 NOTE — Progress Notes (Signed)
[facility], [name] 06/18/20  Subjective:   LOS: 48  Patient underwent hemodialysis yesterday. Tolerated well. Undergoing dialysis on Monday and Fridays. Continues to complain of headache.  Objective:  Vital signs in last 24 hours:  Temp:  [97.1 F (36.2 C)-99.1 F (37.3 C)] 99.1 F (37.3 C) (02/05 1101) Pulse Rate:  [64-75] 64 (02/05 1101) Resp:  [0-18] 16 (02/05 1101) BP: (135-195)/(70-85) 178/77 (02/05 1101) SpO2:  [97 %-98 %] 97 % (02/05 1101)  Weight change:  Filed Weights   06/10/20 0500 06/11/20 0658 06/14/20 0700  Weight: 77.9 kg 76.7 kg 76.1 kg    Intake/Output:    Intake/Output Summary (Last 24 hours) at 06/18/2020 1219 Last data filed at 06/17/2020 2330 Gross per 24 hour  Intake 120 ml  Output 800 ml  Net -680 ml     Physical Exam: General: Laying in bed, NAD  HEENT Nomocephalic, atraumatic  Pulm/lungs Clear, room air  CVS/Heart regular  Abdomen:  soft  Extremities: Trace edema  Neurologic: Alert, oriented  Skin: No rashes   Access: RIJ permcath        Basic Metabolic Panel:  Recent Labs  Lab 06/17/20 1220  NA 138  K 3.7  CL 103  CO2 28  GLUCOSE 105*  BUN 21  CREATININE 1.33*  CALCIUM 7.9*  PHOS 2.9     CBC: Recent Labs  Lab 06/17/20 1220  WBC 5.9  HGB 8.8*  HCT 27.7*  MCV 96.5  PLT 262      Lab Results  Component Value Date   HEPBSAG NON REACTIVE 06/10/2020   HEPBSAB NON REACTIVE 05/16/2020   HEPBIGM NON REACTIVE 05/16/2020      Microbiology:  No results found for this or any previous visit (from the past 240 hour(s)).  Coagulation Studies: No results for input(s): LABPROT, INR in the last 72 hours.  Urinalysis: No results for input(s): COLORURINE, LABSPEC, PHURINE, GLUCOSEU, HGBUR, BILIRUBINUR, KETONESUR, PROTEINUR, UROBILINOGEN, NITRITE, LEUKOCYTESUR in the last 72 hours.  Invalid input(s): APPERANCEUR    Imaging: No results found.   Medications:   . sodium chloride      . carvedilol  12.5 mg Oral BID WC  . Chlorhexidine Gluconate Cloth  6 each Topical Q0600  . cholecalciferol  1,000 Units Oral Daily  . cloNIDine  0.1 mg Oral BID  . epoetin (EPOGEN/PROCRIT) injection  10,000 Units Intravenous Q M,W,F-HD  . feeding supplement  237 mL Oral BID BM  . heparin  5,000 Units Subcutaneous Q8H  . lidocaine  1 patch Transdermal Q24H  . melatonin  5 mg Oral QPM  . methocarbamol  500 mg Oral QID  . multivitamin  1 tablet Oral QHS  . polyethylene glycol  17 g Oral Daily  . senna-docusate  1 tablet Oral BID  . sevelamer carbonate  800 mg Oral TID WC  . sodium chloride flush  3 mL Intravenous Q12H  . topiramate  25 mg Oral BID  . torsemide  10 mg Oral Daily  . traZODone  50 mg Oral QPM  . vitamin B-12  100 mcg Oral Daily   sodium chloride, acetaminophen **OR** acetaminophen, ALPRAZolam, benzonatate, bisacodyl, chlorpheniramine-HYDROcodone, ondansetron **OR** ondansetron (ZOFRAN) IV, promethazine, sodium chloride flush, SUMAtriptan, traMADol  Assessment/ Plan:  65 y.o. female with  diastolic congestive heart failure, hypertension, diabetes mellitus type II, CVA was admitted on 04/30/2020 for  Principal Problem:   Volume overload Active Problems:   Type 2 diabetes mellitus with stage 5 chronic kidney disease ([place])  Hypertension   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HCC)   Hyperkalemia   Pressure injury of skin   Pleural effusion   Acute heart failure with preserved ejection fraction (HFpEF) (HCC)   Chronic kidney disease (CKD), stage IV (severe) ([place])   Pulmonary hypertension, unspecified (HCC)   ATN (acute tubular necrosis) (HCC)  Chronic kidney disease (CKD), stage IV (severe) ([place]) [N18.4] Volume overload [E87.70]   Patient moved from [place] in December 2021. She was following with nephrology there. Patient initiated on hemodialysis this admission. Patient has a prolonged hospitalization. She was initially admitted with volume overload and  acute exacerbation of congestive heart failure. Failed IV diuretics and started on hemodialysis on 12/29. No outpatient dialysis due to patient's lack of insurance and not a resident of the state of [place]. Held dialysis for possible recovery however after several days, creatinine rising and volume accumulation. Patient then resumed on dialysis. She has been weaned down to twice a week  #. ESRD -Patient underwent hemodialysis treatment yesterday.  Tolerated well.  We will plan for hemodialysis again on Monday.  #Hypertension with lower extremity edema -Midodrine has been discontinued.  Suggest that it be used only on dialysis days.  Otherwise continue carvedilol, clonidine, and torsemide.  #. Anemia of CKD  Lab Results  Component Value Date   HGB 8.8 (L) 06/17/2020   Maintain patient on Epogen with dialysis treatments.  #. Secondary hyperparathyroidism of renal origin N 25.81      Component Value Date/Time   PTH 70 (H) 05/12/2020 0946   Lab Results  Component Value Date   PHOS 2.9 06/17/2020   Maintain the patient on Renvela, phosphorus well controlled.   LOS: 29 Onalee Steinbach 2/5/202212:19 PM    [place], [place]

## 2020-06-18 NOTE — Progress Notes (Signed)
PROGRESS NOTE    Lynn Malpass  [id number] DOB: 1955-10-20 DOA: 04/30/2020 PCP: Patient, [name]   Brief Narrative: Taken from prior notes. Sahvanna Amling a 65 y.o.female with a history of diabetes mellitus type 2, stage IV CKD, heart failure, hypertension. Patient presented secondary to dyspnea with evidence of volume overload and pleural effusion.  Patient has had an extended hospital course. Her acute medical issues have essentially resolved at this point. She is medically stable for discharge however we do not have a safe disposition plan. Patient does not have stable living and lives with a friend here. She states that the friend likely will not be able to care for her.  TOC working to find placement however multiple facilities have refused at this point. Senior case management involved. Expanding search.  Patient remains stable.Continues to be very unmotivated always having pain complaints when PT attempted to work with her however then later complains of buttock pain from sitting too much. Been reinforce the need for exertion and ambulation daily basis.  Pt also very orthostatic and having orthostatics checked daily.remained positive for orthostasis despite starting on midodrine and TED hose, will try abdominal binder, most likely autonomic dysfunction.   Subjective: Pt complained of persistent headache.  BP has been very elevated, 190's-200's.     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)   Hyperkalemia   Pressure injury of skin   Pleural effusion   Acute heart failure with preserved ejection fraction (HFpEF) (HCC)   Chronic kidney disease (CKD), stage IV (severe) ([place])   Pulmonary hypertension, unspecified (HCC)   ATN (acute tubular necrosis) (HCC)   Acute on chronic diastolic CHF    Echo done in December 2021 with normal EF and grade 2 diastolic dysfunction.Associated RV systolic dysfunction with elevated PA pressure seen on TTE although RHC on 12/28 significant for mildly elevated right heart/pulmonary artery pressures.  She was initially admitted with volume overload.  Initially managed with IV Lasix, progress to ESRD requiring dialysis, currently volume is being managed with dialysis.  Appears euvolemic. Plan: -Continue with dialysis. --cont torsemide 10 mg daily  Pleural effusion In setting of fluid overload with new diagnosis of grade 2 diastolic dysfunction  Echo 05/01/20 with nml EF S/p diagnostic Rt thoracentesis performed on 12/20 No evidence of maligancy on cytology  Hx of DM2, not currently active  CBG well controlled and A1c of 4.7 which is against the diagnosis of diabetes.  Orthostatic hypotension.   Patient remained significantly orthostatic positive, most likely autonomic dysfunction.  Baseline blood pressure elevated. --scheduled midodrine d/c'ed due to severely elevated BP 190's-200's Plan: --cont TED hose --cont PT  ESRD-MWF- Unknown baseline. 2/2 HTN and atherosclerosis Hx/o NSADS Patient is unsure of her primary nephrologist in [place].  Creatinine of 2.97 on admission.  Per patient, she was planned to start hemodialysis in addition to consideration for renal transplant.  Nephrology consulted on 12/20.  Worsening renal function in setting of attempted Lasix diuresis for heart failure.  She was started on dialysis by nephrology. Patient on midodrine 10 mg on dialysis days due to orthostatic hypotension which she becomes very symptomatic Expected [place] Pending financial clearance for discharge planning Plan: --dialysis per nephrology --midodrine PRN with dialysis  Headache, persistent History of chronic headache.  Patient states she takes  Imitrex as needed.  --taper opioids from oxycodone to tramadol --migraine  cocktail --Fioricet  Moderate pericardial effusion No clinical evidence of tamponade.  No recent infections.  In setting of CKD V with elevated BUN. No chest pain. -cardiology rec. Outpatient Echo for surveillance  Mild/moderate aortic stenosis Unsure if this is symptomatic.  Patient does have dyspnea with exertion but this may be multifactorial. -Cardiology recommendations: outpatient Transthoracic Echocardiogram for surveillance.  Essential hypertension.   On multiple meds which include carvedilol, losartan, torsemide along with midodrine to see if that will help with her positive orthostatic vitals. --scheduled midodrine 10 mg TID d/c'ed on 2/4. -Losartan was discontinued and she was started on clonidine by nephrology  --BP severely elevated, 190's-200's Plan: --cont coreg 12.5 mg BID --increase clonidine to 0.1 mg TID --cont torsemide 10 mg daily --Add hydralazine 50 mg q6h  Anemia Possible related to underlying kidney disease.  Evidence of macrocytosis on CBC.  TIBC low but otherwise normal iron panel. Vitamin B12 level 358, target > 400, started oral supplement and folate normal.  Pressure injury Mid coccyx, POA  History of tobacco use Patient has quit for at least 10 years. Previously, 20 pack year history.  Insomnia, started trazodone and melatonin Continue melatonin and trazodone nightly,seems to be helping patient to sleep  Obesity Body mass index is 30.91 kg/m.  Objective: Vitals:   06/17/20 2329 06/18/20 0532 06/18/20 0719 06/18/20 1101  BP: (!) 169/73 (!) 162/70 (!) 195/77 (!) 178/77  Pulse: 67 66 67 64  Resp: '18 18 16 16  '$ Temp: 98.7 F (37.1 C) (!) 97.1 F (36.2 C) 98 F (36.7 C) 99.1 F (37.3 C)  TempSrc:   Oral Oral  SpO2: 98% 97% 98% 97%  Weight:      Height:        Intake/Output Summary (Last 24 hours) at 06/18/2020 1652 Last data filed at 06/18/2020 1441 Gross per 24 hour  Intake 120 ml  Output 1300 ml  Net -1180 ml   Filed  Weights   06/10/20 0500 06/11/20 0658 06/14/20 0700  Weight: 77.9 kg 76.7 kg 76.1 kg    Examination:  Constitutional: NAD, AAOx3 HEENT: conjunctivae and lids normal, EOMI CV: No cyanosis.   RESP: normal respiratory effort, on RA Neuro: II - XII grossly intact.   Psych: depressed mood and affect.  Appropriate judgement and reason   DVT prophylaxis: Heparin Code Status: Full Family Communication:  Disposition Plan:  Status is: Inpatient  Remains inpatient appropriate because:Unsafe d/c plan, TOC supervisor is involved as she still does not has any place who will take LOG from [facility].  Difficult disposition due to unsafe discharge, apparently she was like living with a friend who cannot take care of her anymore.  TOC is working for places accepting facility.   Dispo: The patient is from: Home              Anticipated d/c is to: SNF              Anticipated d/c date is: whenever SNF bed available              Patient currently is medically stable to d/c.   Difficult to place patient Yes              Level of care: Med-Surg  Consultants:   Nephrology  Procedures:  Antimicrobials:   Data Reviewed: I have personally reviewed following labs and imaging studies  CBC: Recent Labs  Lab 06/17/20 1220  WBC  5.9  HGB 8.8*  HCT 27.7*  MCV 96.5  PLT 99991111   Basic Metabolic Panel: Recent Labs  Lab 06/17/20 1220  NA 138  K 3.7  CL 103  CO2 28  GLUCOSE 105*  BUN 21  CREATININE 1.33*  CALCIUM 7.9*  PHOS 2.9   GFR: Estimated Creatinine Clearance: 45.7 mL/min (A) (by C-G formula based on SCr of 1.33 mg/dL (H)). Liver Function Tests: Recent Labs  Lab 06/17/20 1220  ALBUMIN 2.5*   No results for input(s): LIPASE, AMYLASE in the last 168 hours. No results for input(s): AMMONIA in the last 168 hours. Coagulation Profile: No results for input(s): INR, PROTIME in the last 168 hours. Cardiac Enzymes: No results for input(s): CKTOTAL, CKMB, CKMBINDEX, TROPONINI in the last  168 hours. BNP (last 3 results) No results for input(s): PROBNP in the last 8760 hours. HbA1C: No results for input(s): HGBA1C in the last 72 hours. CBG: No results for input(s): GLUCAP in the last 168 hours. Lipid Profile: No results for input(s): CHOL, HDL, LDLCALC, TRIG, CHOLHDL, LDLDIRECT in the last 72 hours. Thyroid Function Tests: No results for input(s): TSH, T4TOTAL, FREET4, T3FREE, THYROIDAB in the last 72 hours. Anemia Panel: No results for input(s): VITAMINB12, FOLATE, FERRITIN, TIBC, IRON, RETICCTPCT in the last 72 hours. Sepsis Labs: No results for input(s): PROCALCITON, LATICACIDVEN in the last 168 hours.  No results found for this or any previous visit (from the past 240 hour(s)).   Radiology Studies: No results found.  Scheduled Meds: . carvedilol  12.5 mg Oral BID WC  . Chlorhexidine Gluconate Cloth  6 each Topical Q0600  . cholecalciferol  1,000 Units Oral Daily  . cloNIDine  0.1 mg Oral TID  . epoetin (EPOGEN/PROCRIT) injection  10,000 Units Intravenous Q M,W,F-HD  . feeding supplement  237 mL Oral BID BM  . heparin  5,000 Units Subcutaneous Q8H  . hydrALAZINE  50 mg Oral Q6H  . lidocaine  1 patch Transdermal Q24H  . melatonin  5 mg Oral QPM  . methocarbamol  500 mg Oral QID  . multivitamin  1 tablet Oral QHS  . polyethylene glycol  17 g Oral Daily  . prochlorperazine  10 mg Intravenous Once  . senna-docusate  1 tablet Oral BID  . sevelamer carbonate  800 mg Oral TID WC  . sodium chloride flush  3 mL Intravenous Q12H  . topiramate  25 mg Oral BID  . torsemide  10 mg Oral Daily  . traZODone  50 mg Oral QPM  . vitamin B-12  100 mcg Oral Daily   Continuous Infusions: . sodium chloride       LOS: 48 days     Enzo Bi, MD [facility]  If 7PM-7AM, please contact night-coverage [email or web address]  06/18/2020, 4:52 PM

## 2020-06-18 NOTE — Progress Notes (Signed)
OT Cancellation Note  Patient Details Name: Katie [name] MRN: [id number] DOB: 10/05/1955   Cancelled Treatment:    Reason Eval/Treat Not Completed: Patient declined, no reason specified;Pain limiting ability to participate  Pt declines to participate in occupational therapy treatment at this time citing headache. Reports she has received medication from nursing, but is not getting any relief. Pt is laying in the dark in her room with her covers over her head. Will attempt to f/u for OT treatment at next available date/time. Thank you.  Gerrianne Scale, [place], OTR/L ascom [phone] 06/18/20, 10:08 AM

## 2020-06-19 MED ORDER — PROCHLORPERAZINE EDISYLATE 10 MG/2ML IJ SOLN
10.0000 mg | Freq: Once | INTRAMUSCULAR | Status: AC
  Administered 2020-06-19: 10 mg via INTRAVENOUS
  Filled 2020-06-19: qty 2

## 2020-06-19 MED ORDER — DIPHENHYDRAMINE HCL 50 MG/ML IJ SOLN
25.0000 mg | Freq: Once | INTRAMUSCULAR | Status: AC
  Administered 2020-06-19: 25 mg via INTRAVENOUS
  Filled 2020-06-19: qty 1

## 2020-06-19 MED ORDER — BUTALBITAL-APAP-CAFFEINE 50-325-40 MG PO TABS
2.0000 | ORAL_TABLET | Freq: Once | ORAL | Status: AC
  Administered 2020-06-19: 2 via ORAL
  Filled 2020-06-19: qty 2

## 2020-06-19 NOTE — Progress Notes (Signed)
[facility], [name] 06/19/20  Subjective:   LOS: 49 Patient appears much pleasant today, reports headaches are getting better.She reports getting ambulated with physical therapist and tolerating well, encouraged to stay active.  Objective:  Vital signs in last 24 hours:  Temp:  [97.7 F (36.5 C)-98.5 F (36.9 C)] 97.7 F (36.5 C) (02/06 1114) Pulse Rate:  [65-72] 66 (02/06 1114) Resp:  [16-18] 18 (02/06 1114) BP: (91-185)/(46-77) 139/65 (02/06 1114) SpO2:  [97 %-100 %] 98 % (02/06 1114) Weight:  [73.9 kg] 73.9 kg (02/06 0100)  Weight change:  Filed Weights   06/11/20 0658 06/14/20 0700 06/19/20 0100  Weight: 76.7 kg 76.1 kg 73.9 kg    Intake/Output:    Intake/Output Summary (Last 24 hours) at 06/19/2020 1324 Last data filed at 06/19/2020 0400 Gross per 24 hour  Intake 300 ml  Output 500 ml  Net -200 ml     Physical Exam: General:  Resting in bed, in no acute distress  HEENT  sclerae and conjunctivae clear, oral mucous membranes moist  Pulm/lungs  lungs clear to auscultation bilaterally, normal and symmetrical respiratory effort  CVS/Heart  S1-S2 no rubs or gallops  Abdomen:  soft, nondistended, nontender  Extremities:  No peripheral edema  Neurologic:  Awake, alert, oriented  Skin: No acute rashes or lesions  Access: RIJ permcath        Basic Metabolic Panel:  Recent Labs  Lab 06/17/20 1220  NA 138  K 3.7  CL 103  CO2 28  GLUCOSE 105*  BUN 21  CREATININE 1.33*  CALCIUM 7.9*  PHOS 2.9     CBC: Recent Labs  Lab 06/17/20 1220  WBC 5.9  HGB 8.8*  HCT 27.7*  MCV 96.5  PLT 262      Lab Results  Component Value Date   HEPBSAG NON REACTIVE 06/10/2020   HEPBSAB NON REACTIVE 05/16/2020   HEPBIGM NON REACTIVE 05/16/2020      Microbiology:  No results found for this or any previous visit (from the past 240 hour(s)).  Coagulation Studies: No results for input(s): LABPROT, INR in the last 72  hours.  Urinalysis: No results for input(s): COLORURINE, LABSPEC, PHURINE, GLUCOSEU, HGBUR, BILIRUBINUR, KETONESUR, PROTEINUR, UROBILINOGEN, NITRITE, LEUKOCYTESUR in the last 72 hours.  Invalid input(s): APPERANCEUR    Imaging: No results found.   Medications:   . sodium chloride     . butalbital-acetaminophen-caffeine  2 tablet Oral Once  . carvedilol  12.5 mg Oral BID WC  . Chlorhexidine Gluconate Cloth  6 each Topical Q0600  . cholecalciferol  1,000 Units Oral Daily  . cloNIDine  0.1 mg Oral TID  . epoetin (EPOGEN/PROCRIT) injection  10,000 Units Intravenous Q M,W,F-HD  . feeding supplement  237 mL Oral BID BM  . heparin  5,000 Units Subcutaneous Q8H  . hydrALAZINE  50 mg Oral Q6H  . lidocaine  1 patch Transdermal Q24H  . melatonin  5 mg Oral QPM  . methocarbamol  500 mg Oral QID  . multivitamin  1 tablet Oral QHS  . polyethylene glycol  17 g Oral Daily  . prochlorperazine  10 mg Intravenous Once  . senna-docusate  1 tablet Oral BID  . sevelamer carbonate  800 mg Oral TID WC  . sodium chloride flush  3 mL Intravenous Q12H  . topiramate  25 mg Oral BID  . torsemide  10 mg Oral Daily  . traZODone  50 mg Oral QPM  . vitamin B-12  100 mcg Oral Daily  sodium chloride, acetaminophen **OR** acetaminophen, ALPRAZolam, benzonatate, bisacodyl, chlorpheniramine-HYDROcodone, ondansetron **OR** ondansetron (ZOFRAN) IV, promethazine, sodium chloride flush, SUMAtriptan, traMADol  Assessment/ Plan:  65 y.o. female with  diastolic congestive heart failure, hypertension, diabetes mellitus type II, CVA was admitted on 04/30/2020 for  Principal Problem:   Volume overload Active Problems:   Type 2 diabetes mellitus with stage 5 chronic kidney disease (HCC)   Hypertension   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HCC)   Hyperkalemia   Pressure injury of skin   Pleural effusion   Acute heart failure with preserved ejection fraction (HFpEF) (HCC)   Chronic kidney disease (CKD),  stage IV (severe) ([place])   Pulmonary hypertension, unspecified (HCC)   ATN (acute tubular necrosis) (HCC)  Chronic kidney disease (CKD), stage IV (severe) ([place]) [N18.4] Volume overload [E87.70]   Patient moved from [place] in December 2021. She was following with nephrology there. Patient initiated on hemodialysis this admission. Patient has a prolonged hospitalization. She was initially admitted with volume overload and acute exacerbation of congestive heart failure. Failed IV diuretics and started on hemodialysis on 12/29. No outpatient dialysis due to patient's lack of insurance and not a resident of the state of [place]. Held dialysis for possible recovery however after several days, creatinine rising and volume accumulation. Patient then resumed on dialysis. She has been weaned down to twice a week  #. ESRD Volume and electrolyte status acceptable today We will plan dialysis tomorrow  #Hypertension with CKD Patient has fluctuating blood pressure readings and orthostatic hypotension Blood pressure this morning was 149/62 Midodrine discontinued, may use it only on dialysis days She is on carvedilol,clonidine, ,hydralazine and torsemide for hypertension   #. Anemia of CKD  Lab Results  Component Value Date   HGB 8.8 (L) 06/17/2020   Epogen 10,000 units Mondays, Wednesdays, and Fridays with HD  #. Secondary hyperparathyroidism of renal origin N 25.81      Component Value Date/Time   PTH 70 (H) 05/12/2020 0946   Lab Results  Component Value Date   PHOS 2.9 06/17/2020   Continue sevelamer 800 mg 3 times daily We will continue monitoring bone mineral metabolism parameters   LOS: 49 Gulianna Hornsby 2/6/20221:24 PM    [facility] [place], [place]

## 2020-06-19 NOTE — Progress Notes (Signed)
PROGRESS NOTE    Katie [name]  [id number] DOB: [birth year] DOA: 04/30/2020 PCP: Patient, [name]   Brief Narrative: Taken from prior notes. Katie [name] a 65 y.o.female with a history of diabetes mellitus type 2, stage IV CKD, heart failure, hypertension. Patient presented secondary to dyspnea with evidence of volume overload and pleural effusion.  Patient has had an extended hospital course. Her acute medical issues have essentially resolved at this point. She is medically stable for discharge however we do not have a safe disposition plan. Patient does not have stable living and lives with a friend here. She states that the friend likely will not be able to care for her.  TOC working to find placement however multiple facilities have refused at this point. Senior case management involved. Expanding search.  Patient remains stable.Continues to be very unmotivated always having pain complaints when PT attempted to work with her however then later complains of buttock pain from sitting too much. Been reinforce the need for exertion and ambulation daily basis.  Pt also very orthostatic and having orthostatics checked daily.remained positive for orthostasis despite starting on midodrine and TED hose, will try abdominal binder, most likely autonomic dysfunction.   Subjective: Pt still had headache, migraine cocktail wasn't given yesterday.     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)   Hyperkalemia   Pressure injury of skin   Pleural effusion   Acute heart failure with preserved ejection fraction (HFpEF) (HCC)   Chronic kidney disease (CKD), stage IV (severe) ([place])   Pulmonary hypertension, unspecified (HCC)   ATN (acute tubular necrosis) (HCC)   Acute on chronic diastolic CHF   Echo done  in December 2021 with normal EF and grade 2 diastolic dysfunction.Associated RV systolic dysfunction with elevated PA pressure seen on TTE although RHC on 12/28 significant for mildly elevated right heart/pulmonary artery pressures.  She was initially admitted with volume overload.  Initially managed with IV Lasix, progress to ESRD requiring dialysis, currently volume is being managed with dialysis.  Appears euvolemic. Plan: -Continue with dialysis. --cont torsemide 10 mg daily  Pleural effusion In setting of fluid overload with new diagnosis of grade 2 diastolic dysfunction  Echo 05/01/20 with nml EF S/p diagnostic Rt thoracentesis performed on 12/20 No evidence of maligancy on cytology  Hx of DM2, not currently active  CBG well controlled and A1c of 4.7 which is against the diagnosis of diabetes.  Orthostatic hypotension.   Patient remained significantly orthostatic positive, most likely autonomic dysfunction.  Baseline blood pressure elevated. --scheduled midodrine d/c'ed due to severely elevated BP 190's-200's Plan: --cont TED hose --cont PT  ESRD-MWF- Unknown baseline. 2/2 HTN and atherosclerosis Hx/o NSADS Patient is unsure of her primary nephrologist in [place].  Creatinine of 2.97 on admission.  Per patient, she was planned to start hemodialysis in addition to consideration for renal transplant.  Nephrology consulted on 12/20.  Worsening renal function in setting of attempted Lasix diuresis for heart failure.  She was started on dialysis by nephrology. Patient on midodrine 10 mg on dialysis days due to orthostatic hypotension which she becomes very symptomatic Expected [place] Pending financial clearance for discharge planning Plan: --dialysis per nephrology --midodrine PRN with dialysis  Headache, persistent History of chronic headache.  Patient states she takes Imitrex as needed.  --  tapered opioids from oxycodone to tramadol Plan: --tramadol  PRN --migraine cocktail with IV compazine and benadryl --Fioricet  Moderate pericardial effusion No clinical evidence of tamponade.  No recent infections.  In setting of CKD V with elevated BUN. No chest pain. -cardiology rec. Outpatient Echo for surveillance  Mild/moderate aortic stenosis Unsure if this is symptomatic.  Patient does have dyspnea with exertion but this may be multifactorial. -Cardiology recommendations: outpatient Transthoracic Echocardiogram for surveillance.  Essential hypertension.   On multiple meds which include carvedilol, losartan, torsemide along with midodrine to see if that will help with her positive orthostatic vitals. --scheduled midodrine 10 mg TID d/c'ed on 2/4. -Losartan was discontinued and she was started on clonidine by nephrology  --BP severely elevated, 190's-200's Plan: --cont coreg 12.5 mg BID --cont clonidine 0.1 mg TID --cont torsemide 10 mg daily --cont hydralazine 50 mg q6h (new)  Anemia Possible related to underlying kidney disease.  Evidence of macrocytosis on CBC.  TIBC low but otherwise normal iron panel. Vitamin B12 level 358, target > 400, started oral supplement and folate normal.  Pressure injury Mid coccyx, POA  History of tobacco use Patient has quit for at least 10 years. Previously, 20 pack year history.  Insomnia, started trazodone and melatonin Continue melatonin and trazodone nightly,seems to be helping patient to sleep  Obesity Body mass index is 30.91 kg/m.  Objective: Vitals:   06/19/20 0348 06/19/20 0819 06/19/20 1114 06/19/20 1510  BP: (!) 149/62 (!) 185/77 139/65 (!) 115/52  Pulse: 65 69 66 66  Resp: '18 16 18 16  '$ Temp: 98.5 F (36.9 C) 98.1 F (36.7 C) 97.7 F (36.5 C) 98.1 F (36.7 C)  TempSrc:  Oral Oral Oral  SpO2: 99% 99% 98% 97%  Weight:      Height:        Intake/Output Summary (Last 24 hours) at 06/19/2020 1637 Last data filed at 06/19/2020 0400 Gross per 24 hour  Intake 300 ml   Output --  Net 300 ml   Filed Weights   06/11/20 0658 06/14/20 0700 06/19/20 0100  Weight: 76.7 kg 76.1 kg 73.9 kg    Examination:  Constitutional: NAD, sleeping CV: No cyanosis.   RESP: normal respiratory effort, on RA   DVT prophylaxis: Heparin Code Status: Full Family Communication:  Disposition Plan:  Status is: Inpatient  Remains inpatient appropriate because:Unsafe d/c plan, TOC supervisor is involved as she still does not has any place [facility]  Difficult disposition due to unsafe discharge, apparently she was like living with a friend who cannot take care of her anymore.  TOC is working for places accepting facility.   Dispo: The patient is from: Home              Anticipated d/c is to: SNF              Anticipated d/c date is: whenever SNF bed available              Patient currently is medically stable to d/c.   Difficult to place patient Yes              Level of care: Med-Surg  Consultants:   Nephrology  Procedures:  Antimicrobials:   Data Reviewed: I have personally reviewed following labs and imaging studies  CBC: Recent Labs  Lab 06/17/20 1220  WBC 5.9  HGB 8.8*  HCT 27.7*  MCV 96.5  PLT 99991111   Basic Metabolic Panel: Recent Labs  Lab 06/17/20  1220  NA 138  K 3.7  CL 103  CO2 28  GLUCOSE 105*  BUN 21  CREATININE 1.33*  CALCIUM 7.9*  PHOS 2.9   GFR: Estimated Creatinine Clearance: 45.1 mL/min (A) (by C-G formula based on SCr of 1.33 mg/dL (H)). Liver Function Tests: Recent Labs  Lab 06/17/20 1220  ALBUMIN 2.5*   No results for input(s): LIPASE, AMYLASE in the last 168 hours. No results for input(s): AMMONIA in the last 168 hours. Coagulation Profile: No results for input(s): INR, PROTIME in the last 168 hours. Cardiac Enzymes: No results for input(s): CKTOTAL, CKMB, CKMBINDEX, TROPONINI in the last 168 hours. BNP (last 3 results) No results for input(s): PROBNP in the last 8760 hours. HbA1C: No  results for input(s): HGBA1C in the last 72 hours. CBG: No results for input(s): GLUCAP in the last 168 hours. Lipid Profile: No results for input(s): CHOL, HDL, LDLCALC, TRIG, CHOLHDL, LDLDIRECT in the last 72 hours. Thyroid Function Tests: No results for input(s): TSH, T4TOTAL, FREET4, T3FREE, THYROIDAB in the last 72 hours. Anemia Panel: No results for input(s): VITAMINB12, FOLATE, FERRITIN, TIBC, IRON, RETICCTPCT in the last 72 hours. Sepsis Labs: No results for input(s): PROCALCITON, LATICACIDVEN in the last 168 hours.  No results found for this or any previous visit (from the past 240 hour(s)).   Radiology Studies: No results found.  Scheduled Meds: . carvedilol  12.5 mg Oral BID WC  . Chlorhexidine Gluconate Cloth  6 each Topical Q0600  . cholecalciferol  1,000 Units Oral Daily  . cloNIDine  0.1 mg Oral TID  . epoetin (EPOGEN/PROCRIT) injection  10,000 Units Intravenous Q M,W,F-HD  . feeding supplement  237 mL Oral BID BM  . heparin  5,000 Units Subcutaneous Q8H  . hydrALAZINE  50 mg Oral Q6H  . lidocaine  1 patch Transdermal Q24H  . melatonin  5 mg Oral QPM  . methocarbamol  500 mg Oral QID  . multivitamin  1 tablet Oral QHS  . polyethylene glycol  17 g Oral Daily  . prochlorperazine  10 mg Intravenous Once  . senna-docusate  1 tablet Oral BID  . sevelamer carbonate  800 mg Oral TID WC  . sodium chloride flush  3 mL Intravenous Q12H  . topiramate  25 mg Oral BID  . torsemide  10 mg Oral Daily  . traZODone  50 mg Oral QPM  . vitamin B-12  100 mcg Oral Daily   Continuous Infusions: . sodium chloride       LOS: 49 days     Enzo Bi, MD [facility]  If 7PM-7AM, please contact night-coverage [email or web address]  06/19/2020, 4:37 PM

## 2020-06-20 DIAGNOSIS — G8929 Other chronic pain: Secondary | ICD-10-CM

## 2020-06-20 DIAGNOSIS — R519 Headache, unspecified: Secondary | ICD-10-CM

## 2020-06-20 MED ORDER — PROCHLORPERAZINE EDISYLATE 10 MG/2ML IJ SOLN
10.0000 mg | Freq: Two times a day (BID) | INTRAMUSCULAR | Status: DC | PRN
  Administered 2020-06-20 – 2020-07-01 (×9): 10 mg via INTRAVENOUS
  Filled 2020-06-20 (×13): qty 2

## 2020-06-20 MED ORDER — HYDRALAZINE HCL 25 MG PO TABS
25.0000 mg | ORAL_TABLET | Freq: Four times a day (QID) | ORAL | Status: DC
Start: 2020-06-20 — End: 2020-06-21
  Administered 2020-06-20 – 2020-06-21 (×5): 25 mg via ORAL
  Filled 2020-06-20 (×6): qty 1

## 2020-06-20 MED ORDER — ALBUMIN HUMAN 25 % IV SOLN
25.0000 g | INTRAVENOUS | Status: DC
  Administered 2020-06-20 – 2020-07-01 (×2): 25 g via INTRAVENOUS
  Filled 2020-06-20 (×7): qty 100

## 2020-06-20 MED ORDER — DIPHENHYDRAMINE HCL 25 MG PO CAPS
50.0000 mg | ORAL_CAPSULE | Freq: Two times a day (BID) | ORAL | Status: DC | PRN
Start: 2020-06-20 — End: 2020-06-21
  Administered 2020-06-20 – 2020-06-21 (×2): 50 mg via ORAL
  Filled 2020-06-20 (×2): qty 2

## 2020-06-20 NOTE — Progress Notes (Signed)
OT Cancellation Note  Patient Details Name: Katie [name] MRN: [id number] DOB: 04-30-1956   Cancelled Treatment:    Reason Eval/Treat Not Completed: Patient at procedure or test/ unavailable. Chart reviewed. Pt noted to be in dialysis. Will re-attempt OT tx at later date/time as pt is available and medically appropriate.   Jeni Salles, MPH, MS, OTR/L ascom [phone] 06/20/20, 12:36 PM

## 2020-06-20 NOTE — Progress Notes (Signed)
PROGRESS NOTE    Katie [name]  [id number] DOB: November 25, 1955 DOA: 04/30/2020 PCP: Patient, [name]   Brief Narrative: Taken from prior notes. Katie [name] a 65 y.o.female with a history of diabetes mellitus type 2, stage IV CKD, heart failure, hypertension. Patient presented secondary to dyspnea with evidence of volume overload and pleural effusion.  Patient has had an extended hospital course. Her acute medical issues have essentially resolved at this point. She is medically stable for discharge however we do not have a safe disposition plan. Patient does not have stable living and lives with a friend here. She states that the friend likely will not be able to care for her.  TOC working to find placement however multiple facilities have refused at this point. Senior case management involved. Expanding search.  Patient remains stable.Continues to be very unmotivated always having pain complaints when PT attempted to work with her however then later complains of buttock pain from sitting too much. Been reinforce the need for exertion and ambulation daily basis.  Pt also very orthostatic and having orthostatics checked daily.remained positive for orthostasis despite starting on midodrine and TED hose, will try abdominal binder, most likely autonomic dysfunction.   Subjective: Pt reported migraine cocktail made her fall asleep, and headache did improve after she woke up, but headache returned eventually.  BP varied widely, low during dialysis today.   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)   Hyperkalemia   Pressure injury of skin   Pleural effusion   Acute heart failure with preserved ejection fraction (HFpEF) (HCC)   Chronic kidney disease (CKD), stage IV (severe) ([place])   Pulmonary  hypertension, unspecified (HCC)   ATN (acute tubular necrosis) (HCC)   Acute on chronic diastolic CHF   Echo done in December 2021 with normal EF and grade 2 diastolic dysfunction.Associated RV systolic dysfunction with elevated PA pressure seen on TTE although RHC on 12/28 significant for mildly elevated right heart/pulmonary artery pressures.  She was initially admitted with volume overload.  Initially managed with IV Lasix, progress to ESRD requiring dialysis, currently volume is being managed with dialysis.  Appears euvolemic. Plan: --Continue with dialysis. --cont torsemide 10 mg daily  Pleural effusion In setting of fluid overload with new diagnosis of grade 2 diastolic dysfunction  Echo 05/01/20 with nml EF S/p diagnostic Rt thoracentesis performed on 12/20 No evidence of maligancy on cytology  Hx of DM2, not currently active  CBG well controlled and A1c of 4.7 which is against the diagnosis of diabetes.  Orthostatic hypotension.   Patient remained significantly orthostatic positive, most likely autonomic dysfunction.  Baseline blood pressure elevated. --scheduled midodrine d/c'ed due to severely elevated BP 190's-200's Plan: --cont TED hose --cont PT  ESRD MWF Unknown baseline.  Hx/o NSADS Patient is unsure of her primary nephrologist in [place].  Creatinine of 2.97 on admission.  Per patient, she was planned to start hemodialysis in addition to consideration for renal transplant.  Worsening renal function in setting of attempted Lasix diuresis for heart failure.  She was started on dialysis by nephrology. Patient on midodrine 10 mg on dialysis days due to orthostatic hypotension which she becomes very symptomatic Expected [place] Pending financial clearance for discharge planning Plan: --dialysis per nephrology --midodrine PRN with dialysis, per nephrology  Headache, persistent  History of chronic headache.  Patient states she takes Imitrex as needed.   --tapered opioids from oxycodone to tramadol Plan: --tramadol PRN --migraine cocktail with IV compazine and benadryl PRN --Fioricet PRN --neurology consult today --d/c Imitrex, per neuro  Moderate pericardial effusion No clinical evidence of tamponade.  No recent infections.  In setting of CKD V with elevated BUN. No chest pain. -cardiology rec. Outpatient Echo for surveillance  Mild/moderate aortic stenosis Unsure if this is symptomatic.  Patient does have dyspnea with exertion but this may be multifactorial. -Cardiology recommendations: outpatient Transthoracic Echocardiogram for surveillance.  Essential hypertension.   On multiple meds which include carvedilol, losartan, torsemide along with midodrine to see if that will help with her positive orthostatic vitals. --scheduled midodrine 10 mg TID d/c'ed on 2/4. -Losartan was discontinued and she was started on clonidine by nephrology  --BP varied widely. Plan: --cont coreg 12.5 mg BID --cont clonidine 0.1 mg TID --cont torsemide 10 mg daily --decrease hydralazine to 25 mg q6h (new)  Anemia Possible related to underlying kidney disease.  Evidence of macrocytosis on CBC.  TIBC low but otherwise normal iron panel. Vitamin B12 level 358, target > 400, started oral supplement and folate normal.  Pressure injury Mid coccyx, POA  History of tobacco use Patient has quit for at least 10 years. Previously, 20 pack year history.  Insomnia, started trazodone and melatonin Continue melatonin and trazodone nightly,seems to be helping patient to sleep  Obesity Body mass index is 30.91 kg/m.  Objective: Vitals:   06/20/20 1315 06/20/20 1330 06/20/20 1345 06/20/20 1409  BP: 128/63 110/61 137/61 (!) 158/64  Pulse: 68 70 73 77  Resp: '13 16 15 18  '$ Temp:    98.8 F (37.1 C)  TempSrc:    Oral  SpO2:    98%  Weight:      Height:        Intake/Output Summary (Last 24 hours) at 06/20/2020 1517 Last data filed at  06/20/2020 1345 Gross per 24 hour  Intake 0 ml  Output 1139 ml  Net -1139 ml   Filed Weights   06/11/20 0658 06/14/20 0700 06/19/20 0100  Weight: 76.7 kg 76.1 kg 73.9 kg    Examination:  Constitutional: NAD, AAOx3 HEENT: conjunctivae and lids normal, EOMI CV: No cyanosis.   RESP: normal respiratory effort, on RA Extremities: No effusions, edema in BLE SKIN: warm, dry Neuro: II - XII grossly intact.   Psych: depressed mood and affect.     DVT prophylaxis: Heparin Code Status: Full Family Communication:  Disposition Plan:  Status is: Inpatient  Remains inpatient appropriate because:Unsafe d/c plan, TOC supervisor is involved as she still does not has any place [facility]  Difficult disposition due to unsafe discharge, apparently she was like living with a friend who cannot take care of her anymore.  TOC is working for places accepting facility.   Dispo: The patient is from: Home              Anticipated d/c is to: SNF              Anticipated d/c date is: whenever SNF bed available              Patient currently is medically stable to d/c.   Difficult to place patient Yes              Level of care: Med-Surg  Consultants:   Nephrology  Procedures:  Antimicrobials:   Data Reviewed:  I have personally reviewed following labs and imaging studies  CBC: Recent Labs  Lab 06/17/20 1220  WBC 5.9  HGB 8.8*  HCT 27.7*  MCV 96.5  PLT 99991111   Basic Metabolic Panel: Recent Labs  Lab 06/17/20 1220  NA 138  K 3.7  CL 103  CO2 28  GLUCOSE 105*  BUN 21  CREATININE 1.33*  CALCIUM 7.9*  PHOS 2.9   GFR: Estimated Creatinine Clearance: 45.1 mL/min (A) (by C-G formula based on SCr of 1.33 mg/dL (H)). Liver Function Tests: Recent Labs  Lab 06/17/20 1220  ALBUMIN 2.5*   No results for input(s): LIPASE, AMYLASE in the last 168 hours. No results for input(s): AMMONIA in the last 168 hours. Coagulation Profile: No results for input(s): INR, PROTIME  in the last 168 hours. Cardiac Enzymes: No results for input(s): CKTOTAL, CKMB, CKMBINDEX, TROPONINI in the last 168 hours. BNP (last 3 results) No results for input(s): PROBNP in the last 8760 hours. HbA1C: No results for input(s): HGBA1C in the last 72 hours. CBG: No results for input(s): GLUCAP in the last 168 hours. Lipid Profile: No results for input(s): CHOL, HDL, LDLCALC, TRIG, CHOLHDL, LDLDIRECT in the last 72 hours. Thyroid Function Tests: No results for input(s): TSH, T4TOTAL, FREET4, T3FREE, THYROIDAB in the last 72 hours. Anemia Panel: No results for input(s): VITAMINB12, FOLATE, FERRITIN, TIBC, IRON, RETICCTPCT in the last 72 hours. Sepsis Labs: No results for input(s): PROCALCITON, LATICACIDVEN in the last 168 hours.  No results found for this or any previous visit (from the past 240 hour(s)).   Radiology Studies: No results found.  Scheduled Meds: . carvedilol  12.5 mg Oral BID WC  . Chlorhexidine Gluconate Cloth  6 each Topical Q0600  . cholecalciferol  1,000 Units Oral Daily  . cloNIDine  0.1 mg Oral TID  . epoetin (EPOGEN/PROCRIT) injection  10,000 Units Intravenous Q M,W,F-HD  . feeding supplement  237 mL Oral BID BM  . heparin  5,000 Units Subcutaneous Q8H  . hydrALAZINE  25 mg Oral Q6H  . lidocaine  1 patch Transdermal Q24H  . melatonin  5 mg Oral QPM  . methocarbamol  500 mg Oral QID  . multivitamin  1 tablet Oral QHS  . polyethylene glycol  17 g Oral Daily  . prochlorperazine  10 mg Intravenous Once  . senna-docusate  1 tablet Oral BID  . sevelamer carbonate  800 mg Oral TID WC  . sodium chloride flush  3 mL Intravenous Q12H  . topiramate  25 mg Oral BID  . torsemide  10 mg Oral Daily  . traZODone  50 mg Oral QPM  . vitamin B-12  100 mcg Oral Daily   Continuous Infusions: . sodium chloride    . albumin human 25 g (06/20/20 1143)     LOS: 50 days     Enzo Bi, MD [facility]  If 7PM-7AM, please contact  night-coverage [email or web address]  06/20/2020, 3:17 PM

## 2020-06-20 NOTE — Progress Notes (Signed)
[facility], [name] 06/20/20  Subjective:   LOS: 50 Patient seen during hemodialysis.    HEMODIALYSIS FLOWSHEET:  Blood Flow Rate (mL/min): 400 mL/min Arterial Pressure (mmHg): -180 mmHg Venous Pressure (mmHg): 110 mmHg Transmembrane Pressure (mmHg): 60 mmHg Ultrafiltration Rate (mL/min): 670 mL/min Dialysate Flow Rate (mL/min): 600 ml/min Conductivity: Machine : 14 Conductivity: Machine : 14 Dialysis Fluid Bolus: Normal Saline Bolus Amount (mL): 250 mL Dialysate Change: Other (comment)  Patient has headache at this time Alert and oriented   Objective:  Vital signs in last 24 hours:  Temp:  [97.9 F (36.6 C)-98.7 F (37.1 C)] 98.1 F (36.7 C) (02/07 1027) Pulse Rate:  [62-71] 65 (02/07 1230) Resp:  [11-18] 12 (02/07 1230) BP: (90-146)/(47-69) 93/58 (02/07 1230) SpO2:  [97 %-99 %] 98 % (02/07 0722)  Weight change:  Filed Weights   06/11/20 0658 06/14/20 0700 06/19/20 0100  Weight: 76.7 kg 76.1 kg 73.9 kg    Intake/Output:    Intake/Output Summary (Last 24 hours) at 06/20/2020 1323 Last data filed at 06/20/2020 1014 Gross per 24 hour  Intake 0 ml  Output -  Net 0 ml     Physical Exam: General:  Resting in bed, NAD  HEENT  oral mucous moist  Pulm/lungs Clear bilaterally   CVS/Heart  S1-S2 present no rubs or gallops  Abdomen:  soft, nondistended, nontender  Extremities:  No peripheral edema  Neurologic:  Awake, alert, oriented  Skin: No acute rashes or lesions  Access: RIJ permcath        Basic Metabolic Panel:  Recent Labs  Lab 06/17/20 1220  NA 138  K 3.7  CL 103  CO2 28  GLUCOSE 105*  BUN 21  CREATININE 1.33*  CALCIUM 7.9*  PHOS 2.9     CBC: Recent Labs  Lab 06/17/20 1220  WBC 5.9  HGB 8.8*  HCT 27.7*  MCV 96.5  PLT 262      Lab Results  Component Value Date   HEPBSAG NON REACTIVE 06/10/2020   HEPBSAB NON REACTIVE 05/16/2020   HEPBIGM NON REACTIVE 05/16/2020      Microbiology:  No  results found for this or any previous visit (from the past 240 hour(s)).  Coagulation Studies: No results for input(s): LABPROT, INR in the last 72 hours.  Urinalysis: No results for input(s): COLORURINE, LABSPEC, PHURINE, GLUCOSEU, HGBUR, BILIRUBINUR, KETONESUR, PROTEINUR, UROBILINOGEN, NITRITE, LEUKOCYTESUR in the last 72 hours.  Invalid input(s): APPERANCEUR    Imaging: No results found.   Medications:   . sodium chloride    . albumin human 25 g (06/20/20 1143)   . carvedilol  12.5 mg Oral BID WC  . Chlorhexidine Gluconate Cloth  6 each Topical Q0600  . cholecalciferol  1,000 Units Oral Daily  . cloNIDine  0.1 mg Oral TID  . epoetin (EPOGEN/PROCRIT) injection  10,000 Units Intravenous Q M,W,F-HD  . feeding supplement  237 mL Oral BID BM  . heparin  5,000 Units Subcutaneous Q8H  . hydrALAZINE  25 mg Oral Q6H  . lidocaine  1 patch Transdermal Q24H  . melatonin  5 mg Oral QPM  . methocarbamol  500 mg Oral QID  . multivitamin  1 tablet Oral QHS  . polyethylene glycol  17 g Oral Daily  . prochlorperazine  10 mg Intravenous Once  . senna-docusate  1 tablet Oral BID  . sevelamer carbonate  800 mg Oral TID WC  . sodium chloride flush  3 mL Intravenous Q12H  . topiramate  25  mg Oral BID  . torsemide  10 mg Oral Daily  . traZODone  50 mg Oral QPM  . vitamin B-12  100 mcg Oral Daily   sodium chloride, acetaminophen **OR** acetaminophen, ALPRAZolam, benzonatate, bisacodyl, chlorpheniramine-HYDROcodone, ondansetron **OR** ondansetron (ZOFRAN) IV, promethazine, sodium chloride flush, SUMAtriptan, traMADol  Assessment/ Plan:  65 y.o. female with  diastolic congestive heart failure, hypertension, diabetes mellitus type II, CVA was admitted on 04/30/2020 for  Principal Problem:   Volume overload Active Problems:   Type 2 diabetes mellitus with stage 5 chronic kidney disease (HCC)   Hypertension   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HCC)   Hyperkalemia   Pressure  injury of skin   Pleural effusion   Acute heart failure with preserved ejection fraction (HFpEF) (HCC)   Chronic kidney disease (CKD), stage IV (severe) ([place])   Pulmonary hypertension, unspecified (HCC)   ATN (acute tubular necrosis) (HCC)  Chronic kidney disease (CKD), stage IV (severe) ([place]) [N18.4] Volume overload [E87.70]   Patient moved from [place] in December 2021. She was following with nephrology there. Patient initiated on hemodialysis this admission. Patient has a prolonged hospitalization. She was initially admitted with volume overload and acute exacerbation of congestive heart failure. Failed IV diuretics and started on hemodialysis on 12/29. No outpatient dialysis due to patient's lack of insurance and not a resident of the state of [place]. Held dialysis for possible recovery however after several days, creatinine rising and volume accumulation. Patient then resumed on dialysis. She has been weaned down to twice a week  #. ESRD Volume and electrolyte status acceptable today Currently receiving hemodailysis Continue MWF treatments.   #Hypertension with CKD Current BP during dialysis is 92/59 Will consider Midodrine for dialysis days. Ordered Albumin 25G to be given on HD days (MWF) She is on carvedilol,clonidine, ,hydralazine and torsemide for hypertension   #. Anemia of CKD  Lab Results  Component Value Date   HGB 8.8 (L) 06/17/2020   Epogen 10,000 units Mondays, Wednesdays, and Fridays with HD  #. Secondary hyperparathyroidism of renal origin N 25.81      Component Value Date/Time   PTH 70 (H) 05/12/2020 0946   Lab Results  Component Value Date   PHOS 2.9 06/17/2020   Continue sevelamer 800 mg with meals We will continue monitoring bone mineral metabolism parameters   LOS: [place] 2/7/20221:23 [place], [place]

## 2020-06-20 NOTE — Progress Notes (Signed)
PT Cancellation Note  Patient Details Name: Katie [name] MRN: [id number] DOB: Jul 31, 1955   Cancelled Treatment:    Reason Eval/Treat Not Completed: Patient at procedure or test/unavailable. Patient at HD this morning. Will re-attempt when patient available.    Mickenzie Stolar 06/20/2020, 11:11 AM

## 2020-06-20 NOTE — Consult Note (Addendum)
Neurology Consultation Reason for Consult: Headache Requesting Physician: Enzo Bi  CC: Headaches  History is obtained from: Patient and chart review   HPI: Katie [name] is a 65 y.o. 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  She has had a prolonged hospital stay partially due to social issues.  She reports she had been living in [place] with her sister and struggling with orthostatic hypotension as well as severe headaches which started approximately a year ago.  In March 2021 she reportedly had a TIA for which work-up in the hospital was initially negative but subsequent repeat MRI on neurology follow-up demonstrated small punctate strokes.  He reports various diagnoses by neurologists including seizure disorder, cluster headache, and migraine headaches.  She reported that tilt table testing was ordered in [place] but she had not completed this prior to moving here.  Her support system in [place] with her sister who is also caring for a 5 year old mother-in-law with medical conditions as well and patient reports that her sister seems to think "I brought this on myself."  She moved here to live with her best friend, who she reports has been an incredible source of support "but don't tell her that... I don't want her to get a big head"   She reports that prior to a year ago she was healthy and "didn't need to go to a doctor for 20 years"  Regarding her headache history, she reports that she did occasionally get headaches often triggered by light exposure earlier in her life.  Generally her headaches are on the right side, associated with nausea and vomiting, typically worse towards the end of the day, (but then she reports that they sometimes but not  reliably wake her up from sleep but that they no particular pattern to time of day that they occur).  She reports that laying down in a dark room helps her headaches but then also reports that sometimes the improve when she would pace / walk her dog, which she has been unable to do here.  Limited/regular caffeine intake of 1 cup a day does help her headaches, though she reports in the past severe withdrawal headaches when using larger amounts of caffeine.  She does not report any lacrimation or conjunctival injection or ptosis associated with these events.  She does have significant cervical degenerative disc disease and reports that she had lightening shock type of sensations which improved dramatically with Cymbalta which has not been continued here given her renal function.  She denies any changes in her vision except when she is orthostatic and loses consciousness she does get tunnel vision in that setting.  She doesn't have any double vision.  She has not any fever/chills/sweats or change in bowel or bladder function (she did have some constipation earlier this hospitalization and she is still making urine).  She denies any history of meningitis/cerebritis.  She did have several concussions as a child playing sports with possible brief loss of consciousness, as well as an episode of orthostatic hypotension that was complicated by a loss of consciousness and head strike (May / June 2021)  She was hospitalized for volume overload and has developed a new dialysis requirement, though her dialysis sessions are being weaned (currently twice weekly).  She also underwent thoracentesis for pleural effusion and was also noted to have pericardial  effusion on echocardiogram as well as aortic stenosis and aortic insufficiency.  She has also had very labile blood pressures.  She reports significant worsening of her orthostasis with clonidine in the past but she has been tolerating that somewhat better here.  She has many  episodes documented of not participating with PT/OT due to being out on dialysis, having a headache, etc. However notably today she did have good participation.  Headache treatments during this hospitalization have included Imitrex, Percocet, tramadol, Fioricet, Benadryl/Compazine.  Of these, she reports that the combination of Imitrex with Percocet is most effective.  She was unaware that Imitrex should be limited to 9 doses per month though she does recall filling a prescription for this medication with a very small number of pills per month in the past.  She reports that the tramadol that her Percocets have been replaced with is less effective but still does lessen her pain.  Fioricet is been again less effective than tramadol but still slightly helpful.  She feels that Benadryl/Compazine make her very sleepy but that her headache is not much improved when she wakes.  She is not aware she is taking a muscle relaxer (Robaxin).   ROS: A 14 point ROS was performed and is negative except as noted in the HPI.   Past Medical History:  Diagnosis Date  . 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[place])   . Hypertension   . Renal disorder   . Type 2 diabetes mellitus ([place])    Past Surgical History:  Procedure Laterality Date  . APPENDECTOMY    . DIALYSIS/PERMA CATHETER INSERTION N/A 05/16/2020   Procedure: DIALYSIS/PERMA CATHETER INSERTION;  Surgeon: Algernon Huxley, MD;  Location: [facility];  Service: Cardiovascular;  Laterality: N/A;  . MANDIBLE SURGERY    . RENAL BIOPSY Right   . RIGHT HEART CATH N/A 05/10/2020   Procedure: RIGHT HEART CATH;  Surgeon: Nelva Bush, MD;  Location: [facility];  Service: Cardiovascular;  Laterality: N/A;  . TEMPORARY DIALYSIS CATHETER N/A 05/11/2020   Procedure: TEMPORARY DIALYSIS CATHETER;  Surgeon: Algernon Huxley, MD;  Location: [facility];  Service: Cardiovascular;  Laterality: N/A;     Past Surgical History:  Procedure  Laterality Date  . APPENDECTOMY    . DIALYSIS/PERMA CATHETER INSERTION N/A 05/16/2020   Procedure: DIALYSIS/PERMA CATHETER INSERTION;  Surgeon: Algernon Huxley, MD;  Location: [facility];  Service: Cardiovascular;  Laterality: N/A;  . MANDIBLE SURGERY    . RENAL BIOPSY Right   . RIGHT HEART CATH N/A 05/10/2020   Procedure: RIGHT HEART CATH;  Surgeon: Nelva Bush, MD;  Location: [facility];  Service: Cardiovascular;  Laterality: N/A;  . TEMPORARY DIALYSIS CATHETER N/A 05/11/2020   Procedure: TEMPORARY DIALYSIS CATHETER;  Surgeon: Algernon Huxley, MD;  Location: [facility];  Service: Cardiovascular;  Laterality: N/A;     Current Facility-Administered Medications:  .  0.9 %  sodium chloride infusion, 250 mL, Intravenous, PRN, Lucky Cowboy, Erskine Squibb, MD .  acetaminophen (TYLENOL) tablet 650 mg, 650 mg, Oral, Q6H PRN, 650 mg at 06/19/20 0558 **OR** acetaminophen (TYLENOL) suppository 650 mg, 650 mg, Rectal, Q6H PRN, Algernon Huxley, MD .  albumin human 25 % solution 25 g, 25 g, Intravenous, Q M,W,F-HD, Lateef, Munsoor, MD, Last Rate: 60 mL/hr at 06/20/20 1143, 25 g at 06/20/20 1143 .  ALPRAZolam (XANAX) tablet 0.25 mg, 0.25 mg, Oral, TID PRN, Lucky Cowboy, Erskine Squibb, MD, 0.25  mg at 06/09/20 2038 .  benzonatate (TESSALON) capsule 200 mg, 200 mg, Oral, BID PRN, Lorella Nimrod, MD .  bisacodyl (DULCOLAX) suppository 10 mg, 10 mg, Rectal, Daily PRN, Algernon Huxley, MD, 10 mg at 05/11/20 0926 .  carvedilol (COREG) tablet 12.5 mg, 12.5 mg, Oral, BID WC, Algernon Huxley, MD, 12.5 mg at 06/21/20 0827 .  Chlorhexidine Gluconate Cloth 2 % PADS 6 each, 6 each, Topical, Q0600, Val Riles, MD, 6 each at 06/21/20 0930 .  chlorpheniramine-HYDROcodone (TUSSIONEX) 10-8 MG/5ML suspension 5 mL, 5 mL, Oral, Q12H PRN, Priscella Mann, Sudheer B, MD, 5 mL at 06/19/20 0558 .  cholecalciferol (VITAMIN D3) tablet 1,000 Units, 1,000 Units, Oral, Daily, Priscella Mann, Sudheer B, MD, 1,000 Units at 06/21/20 0827 .  cloNIDine (CATAPRES)  tablet 0.1 mg, 0.1 mg, Oral, TID, Enzo Bi, MD, 0.1 mg at 06/21/20 0826 .  diphenhydrAMINE (BENADRYL) capsule 50 mg, 50 mg, Oral, BID PRN, Enzo Bi, MD, 50 mg at 06/21/20 0825 .  epoetin alfa (EPOGEN) injection 10,000 Units, 10,000 Units, Intravenous, Q M,W,F-HD, Kolluru, Sarath, MD, 10,000 Units at 06/20/20 1056 .  feeding supplement (ENSURE ENLIVE / ENSURE PLUS) liquid 237 mL, 237 mL, Oral, BID BM, Sreenath, Sudheer B, MD, 237 mL at 06/19/20 1423 .  heparin injection 5,000 Units, 5,000 Units, Subcutaneous, Q8H, Dew, Erskine Squibb, MD, 5,000 Units at 06/20/20 2203 .  hydrALAZINE (APRESOLINE) tablet 25 mg, 25 mg, Oral, Q6H, Enzo Bi, MD, 25 mg at 06/21/20 1254 .  lidocaine (LIDODERM) 5 % 1 patch, 1 patch, Transdermal, Q24H, Sreenath, Sudheer B, MD, 1 patch at 06/15/20 1819 .  melatonin tablet 5 mg, 5 mg, Oral, QPM, Val Riles, MD, 5 mg at 06/20/20 2202 .  methocarbamol (ROBAXIN) tablet 500 mg, 500 mg, Oral, QID, Sreenath, Sudheer B, MD, 500 mg at 06/21/20 0825 .  multivitamin (RENA-VIT) tablet 1 tablet, 1 tablet, Oral, QHS, Dew, Erskine Squibb, MD, 1 tablet at 06/19/20 2217 .  ondansetron (ZOFRAN) tablet 4 mg, 4 mg, Oral, Q6H PRN, 4 mg at 06/01/20 1456 **OR** ondansetron (ZOFRAN) injection 4 mg, 4 mg, Intravenous, Q6H PRN, Algernon Huxley, MD, 4 mg at 05/29/20 2139 .  polyethylene glycol (MIRALAX / GLYCOLAX) packet 17 g, 17 g, Oral, Daily, Algernon Huxley, MD, 17 g at 06/21/20 [id number] .  prochlorperazine (COMPAZINE) injection 10 mg, 10 mg, Intravenous, BID PRN, Enzo Bi, MD, 10 mg at 06/21/20 [id number] .  promethazine (PHENERGAN) injection 12.5 mg, 12.5 mg, Intravenous, Q8H PRN, Algernon Huxley, MD, 12.5 mg at 05/13/20 1008 .  senna-docusate (Senokot-S) tablet 1 tablet, 1 tablet, Oral, BID, Dew, Erskine Squibb, MD, 1 tablet at 06/21/20 [id number] .  sevelamer carbonate (RENVELA) tablet 800 mg, 800 mg, Oral, TID WC, Kolluru, Sarath, MD, 800 mg at 06/21/20 1254 .  sodium chloride flush (NS) 0.9 % injection 3 mL, 3 mL, Intravenous, Q12H,  Dew, Erskine Squibb, MD, 3 mL at 06/21/20 0829 .  sodium chloride flush (NS) 0.9 % injection 3 mL, 3 mL, Intravenous, PRN, Algernon Huxley, MD, 3 mL at 06/16/20 2147 .  topiramate (TOPAMAX) tablet 25 mg, 25 mg, Oral, BID, Dew, Erskine Squibb, MD, 25 mg at 06/21/20 [id number] .  torsemide (DEMADEX) tablet 10 mg, 10 mg, Oral, Daily, Murlean Iba, MD, 10 mg at 06/21/20 0826 .  traMADol (ULTRAM) tablet 50 mg, 50 mg, Oral, Q6H PRN, Enzo Bi, MD, 50 mg at 06/21/20 0826 .  traZODone (DESYREL) tablet 50 mg, 50 mg, Oral, QPM, Val Riles, MD, 50 mg at 06/20/20 2202 .  vitamin B-12 (CYANOCOBALAMIN) tablet 100 mcg, 100 mcg, Oral, Daily, Val Riles, MD, 100 mcg at 06/21/20 [id number]   Family History  Problem Relation Age of Onset  . Heart attack Mother   . Asthma Mother   . Uterine cancer Mother   . Skin cancer Father   . Prostate cancer Father   . CAD Father   . Parkinson's disease Father   . Lung cancer Brother   -She reports that her aunt had migraine headaches   Social History:  reports that she has quit smoking. She has never used smokeless tobacco. She reports previous alcohol use. No history on file for drug use.  Exam: Current vital signs: BP (!) 158/64 (BP Location: Left Arm)   Pulse 77   Temp 98.8 F (37.1 C) (Oral)   Resp 18   Ht '5\' 6"'$  (1.676 m)   Wt 73.9 kg   SpO2 98%   BMI 26.30 kg/m  Vital signs in last 24 hours: Temp:  [97.9 F (36.6 C)-98.8 F (37.1 C)] 98.8 F (37.1 C) (02/07 1409) Pulse Rate:  [62-77] 77 (02/07 1409) Resp:  [11-18] 18 (02/07 1409) BP: (83-158)/(47-69) 158/64 (02/07 1409) SpO2:  [97 %-99 %] 98 % (02/07 1409)   Physical Exam  Constitutional: Appears well-developed and well-nourished.  Psych: Appropriate and cooperative, very pleasant except when discontinuation of opiates was discussed Eyes: No scleral injection HENT: No OP obstruction.  She does exhibit some scalp allodynia and in particular some tenderness at the right occipital nerve more than other areas MSK:  no joint deformities.  Cardiovascular: Normal rate and regular rhythm.  Respiratory: Effort normal, non-labored breathing GI: Soft.  No distension. There is no tenderness.  Skin: Warm dry and intact visible skin  Neuro: Mental Status: Patient is awake, alert, oriented to person, place, month, year, and situation. Patient is able to give a clear and coherent history. No signs of aphasia or neglect Cranial Nerves: II: Visual Fields are full. Pupils are equal, round, and reactive to light 2.5 mm to 1 mm III,IV, VI: EOMI with mild right eye ptosis. She does report some diploplia on lateral gaze.  V: Facial sensation is reduced 10% to light touch on the right VII: Facial movement is symmetric.  VIII: hearing is intact to voice X: Uvula elevates symmetrically XI: Shoulder shrug is symmetric. XII: tongue is midline without atrophy or fasciculations.  Motor:  Tone is normal. Bulk is normal. 5/5 strength was present in all four extremities except 4/5 triceps extension, 4/5 finger extension, 4/5 hip flexion on the right, with some element of effort dependence but possibly some slight underlying true weakness as well Sensory: Sensation is also reduced on the right arm and leg, slightly (~10%) Deep Tendon Reflexes: 2+ and symmetric in the biceps. No clonus. Mute toes bilaterally  Cerebellar: FNF and HKS are intact bilaterally  I have reviewed labs in epic and the results pertinent to this consultation are: CBC notable for anemia to 9.8, being treated with Epogen per nephrology Cr 2.7 with dialysis   Lab Results  Component Value Date   HGBA1C 4.7 (L) 05/01/2020    Lab Results  Component Value Date   CHOL 137 05/03/2020   HDL 51 05/03/2020   LDLCALC 75 05/03/2020   TRIG 55 05/03/2020   CHOLHDL 2.7 05/03/2020   Echo 05/01/20 1. Left ventricular ejection fraction, by estimation, is 55 to 60%. The  left ventricle has normal function. The left ventricle has no regional  wall motion  abnormalities.  There is severe concentric left ventricular  hypertrophy. Left ventricular diastolic  parameters are consistent with Grade II diastolic dysfunction  (pseudonormalization).  2. Right ventricular systolic function is moderately reduced. The right  ventricular size is normal. Mildly increased right ventricular wall  thickness. There is moderately elevated pulmonary artery systolic  pressure.  3. Left atrial size was mildly dilated.  4. Right atrial size was mildly dilated.  5. Moderate pericardial effusion. The pericardial effusion is  circumferential.  6. The mitral valve is abnormal. Mild to moderate mitral valve  regurgitation.  7. The tricuspid valve is abnormal. Tricuspid valve regurgitation is mild  to moderate.  8. The aortic valve is abnormal. Aortic valve regurgitation is mild to  moderate. Mild to moderate aortic valve stenosis.    Impression: Dashell Ryon has had worsening headaches over the course of her hospitalization in the setting of new regular opiate use, suspect worsening of chronic migraines in the setting of stress of acute hospitalization, dialysis, blood pressure lability and medication overuse. Likely some element of cervicogenic headache as well and demoralization / potential depression contributing. She does not quite meet ICDH criteria yet for medication overuse headache given that overuse has not been ongoing for greater than 3 months, but given the use pattern of triptans and opiates in this patient for over 7 weeks she is very much at risk of this phenomena.  Treatment options are limited in the setting of her significant cardiac and renal issues.  After extensive discussion with the patient, we will proceed as follows below  Recommendations: -Per Lexi comp, Cymbalta is not recommended for patients on dialysis.  We will therefore try venlafaxine in the same class per Lexicomp dose recommendations (ordered)  Initial: 37.5 mg once daily;  titrate cautiously, not to exceed 50% of the maximum recommended dose (50% of 225 mg = 112.5 mg daily).  -Depakote 10 mg/kg tonight (600 mg IV once), then 250 mg TID oral x 2 days, then 250 mg BID x 2 days, then 250 mg nightly x 2 days (ordered)     -Post load depakote level in the morning -Tizanidine 4 mg QHS x 3 days, then PRN (robaxin discontinued) -Continue rapid wean of opiates per primary team -Avoid immitrex until March 8th 2022, try 25 mg dose at a limit of 9 doses per month, but no more than 3 doses per week, but no more than 2 doses per day (may be repeated within 2 hours once on a given day) -Consider clonidine patch instead of pills to smooth out blood pressure control and help with opiate withdrawal -Tylenol changed from 650 mg q6hr to 1000 mg q8hr -Outpatient neurology follow-up for nerve blocks, botox and CGRP inhibitor consideration -TED stockings off while in bed, on before activity. Consider abdominal binder before activity as well.  -Neurology will continue to follow, may consider additional imaging based on clinical course  Lesleigh Noe MD-PhD [facility] [phone]

## 2020-06-21 LAB — CBC
HCT: 30.8 % — ABNORMAL LOW (ref 36.0–46.0)
Hemoglobin: 9.8 g/dL — ABNORMAL LOW (ref 12.0–15.0)
MCH: 31.5 pg (ref 26.0–34.0)
MCHC: 31.8 g/dL (ref 30.0–36.0)
MCV: 99 fL (ref 80.0–100.0)
Platelets: 258 10*3/uL (ref 150–400)
RBC: 3.11 MIL/uL — ABNORMAL LOW (ref 3.87–5.11)
RDW: 14.6 % (ref 11.5–15.5)
WBC: 5.7 10*3/uL (ref 4.0–10.5)
nRBC: 0 % (ref 0.0–0.2)

## 2020-06-21 LAB — HEPATIC FUNCTION PANEL
ALT: 9 U/L (ref 0–44)
AST: 13 U/L — ABNORMAL LOW (ref 15–41)
Albumin: 2.7 g/dL — ABNORMAL LOW (ref 3.5–5.0)
Alkaline Phosphatase: 50 U/L (ref 38–126)
Bilirubin, Direct: <0.1 mg/dL (ref 0.0–0.2)
Total Bilirubin: 0.8 mg/dL (ref 0.3–1.2)
Total Protein: 4.9 g/dL — ABNORMAL LOW (ref 6.5–8.1)

## 2020-06-21 LAB — BASIC METABOLIC PANEL
Anion gap: 8 (ref 5–15)
BUN: 29 mg/dL — ABNORMAL HIGH (ref 8–23)
CO2: 27 mmol/L (ref 22–32)
Calcium: 8.5 mg/dL — ABNORMAL LOW (ref 8.9–10.3)
Chloride: 102 mmol/L (ref 98–111)
Creatinine, Ser: 2.15 mg/dL — ABNORMAL HIGH (ref 0.44–1.00)
GFR, Estimated: 25 mL/min — ABNORMAL LOW (ref >60)
Glucose, Bld: 103 mg/dL — ABNORMAL HIGH (ref 70–99)
Potassium: 4.3 mmol/L (ref 3.5–5.1)
Sodium: 137 mmol/L (ref 135–145)

## 2020-06-21 LAB — MAGNESIUM: Magnesium: 2.3 mg/dL (ref 1.7–2.4)

## 2020-06-21 MED ORDER — VALPROATE SODIUM 100 MG/ML IV SOLN
900.0000 mg | Freq: Once | INTRAVENOUS | Status: AC
  Administered 2020-06-21: 900 mg via INTRAVENOUS
  Filled 2020-06-21: qty 9

## 2020-06-21 MED ORDER — DIVALPROEX SODIUM 250 MG PO DR TAB
250.0000 mg | DELAYED_RELEASE_TABLET | Freq: Three times a day (TID) | ORAL | Status: AC
  Administered 2020-06-22 – 2020-06-23 (×6): 250 mg via ORAL
  Filled 2020-06-21 (×6): qty 1

## 2020-06-21 MED ORDER — TOPIRAMATE 25 MG PO TABS
50.0000 mg | ORAL_TABLET | Freq: Every day | ORAL | Status: DC
  Administered 2020-06-22 – 2020-06-24 (×3): 50 mg via ORAL
  Filled 2020-06-21 (×4): qty 2

## 2020-06-21 MED ORDER — TOPIRAMATE 25 MG PO TABS
25.0000 mg | ORAL_TABLET | Freq: Every day | ORAL | Status: DC
  Administered 2020-06-22 – 2020-06-25 (×5): 25 mg via ORAL
  Filled 2020-06-21 (×5): qty 1

## 2020-06-21 MED ORDER — VENLAFAXINE HCL 37.5 MG PO TABS
37.5000 mg | ORAL_TABLET | Freq: Every day | ORAL | Status: DC
  Administered 2020-06-21 – 2020-06-24 (×4): 37.5 mg via ORAL
  Filled 2020-06-21 (×5): qty 1

## 2020-06-21 MED ORDER — TIZANIDINE HCL 2 MG PO TABS
2.0000 mg | ORAL_TABLET | Freq: Every day | ORAL | Status: AC
  Administered 2020-06-21 – 2020-06-23 (×3): 2 mg via ORAL
  Filled 2020-06-21 (×3): qty 1

## 2020-06-21 MED ORDER — TRAMADOL HCL 50 MG PO TABS
50.0000 mg | ORAL_TABLET | Freq: Three times a day (TID) | ORAL | Status: DC | PRN
  Administered 2020-06-22 – 2020-06-23 (×3): 50 mg via ORAL
  Filled 2020-06-21 (×3): qty 1

## 2020-06-21 MED ORDER — TIZANIDINE HCL 2 MG PO TABS
2.0000 mg | ORAL_TABLET | Freq: Every evening | ORAL | Status: DC | PRN
  Administered 2020-06-24: 21:00:00 2 mg via ORAL
  Filled 2020-06-21 (×2): qty 1

## 2020-06-21 MED ORDER — ACETAMINOPHEN 650 MG RE SUPP: 650.0000 mg | Freq: Three times a day (TID) | RECTAL | Status: DC | PRN

## 2020-06-21 MED ORDER — ACETAMINOPHEN 500 MG PO TABS
1000.0000 mg | ORAL_TABLET | Freq: Three times a day (TID) | ORAL | Status: DC | PRN
  Administered 2020-06-24 – 2020-07-01 (×12): 1000 mg via ORAL
  Filled 2020-06-21 (×12): qty 2

## 2020-06-21 MED ORDER — CLONIDINE HCL 0.1 MG/24HR TD PTWK
0.1000 mg | MEDICATED_PATCH | TRANSDERMAL | Status: DC
  Administered 2020-06-21 – 2020-06-28 (×2): 0.1 mg via TRANSDERMAL
  Filled 2020-06-21 (×2): qty 1

## 2020-06-21 MED ORDER — DIVALPROEX SODIUM 250 MG PO DR TAB
250.0000 mg | DELAYED_RELEASE_TABLET | Freq: Two times a day (BID) | ORAL | Status: DC
  Administered 2020-06-24: 250 mg via ORAL
  Filled 2020-06-21 (×2): qty 1

## 2020-06-21 MED ORDER — DIVALPROEX SODIUM 250 MG PO DR TAB: 250.0000 mg | DELAYED_RELEASE_TABLET | Freq: Every day | ORAL | Status: DC

## 2020-06-21 NOTE — Progress Notes (Signed)
Physical Therapy Treatment Patient Details Name: Katie [name] MRN: [id number] DOB: [birth year] Today's Date: 06/21/2020    History of Present Illness Patient is a 65 y.o. female with a history of diabetes mellitus type 2, stage IV CKD, heart failure, hypertension. Patient presented secondary to dyspnea with evidence of volume overload and pleural effusion. Diagnosed with moderate pericardial effusion, mild to moderate aortic insufficiency and aortic stenosis.  S/p R heart cath 12/28.  R temp fem cath placed 12/29; 1/3 R IJ permcath placed; 1/4 R temp fem cath removed.    PT Comments    Pt was pleasant and motivated to participate during the session.  Pt's supine vitals include BP 149/63 with HR 72 bpm.  Pt put forth good effort with below therex with vitals then taken in sitting with BP 113/61 and HR 70 bpm with mild dizziness.  Pt then ambulated 1 x 100' with mild dizziness with vitals taken back in sitting with BP 108/71 and HR 75 bpm.  Pt was then able to amb 200' with no increase in mild symptoms.  Pt's gait was mildly unsteady and effortful with mod lean on the RW for support but no physical assistance required to prevent LOB. Pt remains at an elevated risk for falls secondary to orthostatic hypotension and will benefit from PT services in a SNF setting upon discharge to safely address deficits listed in patient problem list for decreased caregiver assistance and eventual return to PLOF.      Follow Up Recommendations  Supervision for mobility/OOB;SNF     Equipment Recommendations  Wheelchair (measurements PT);Wheelchair cushion (measurements PT);Rolling walker with 5" wheels;3in1 (PT)    Recommendations for Other Services       Precautions / Restrictions Precautions Precautions: Fall Precaution Comments: R IJ permcath; monitor orthostatics Restrictions Other Position/Activity Restrictions: Pt Orthostatic, monitor BP with change of position    Mobility  Bed Mobility Overal bed  mobility: Modified Independent Bed Mobility: Supine to Sit     Supine to sit: Modified independent (Device/Increase time)     General bed mobility comments: Extra time and effort and use of rail, patient soiled upon removing pure wick. She was unaware.  Transfers Overall transfer level: Needs assistance Equipment used: Rolling walker (2 wheeled) Transfers: Sit to/from Stand Sit to Stand: Min assist;From elevated surface         General transfer comment: Patient require bed to be elevated and min A for initial sit to stand and then CGA for subsequent transfers from recliner.  Ambulation/Gait Ambulation/Gait assistance: Min guard Gait Distance (Feet): 200 Feet Assistive device: Rolling walker (2 wheeled) Gait Pattern/deviations: Step-through pattern;Decreased stride length Gait velocity: slightly decreased   General Gait Details: Pt able to amb 1 x 100' and then 1 x 200' this session with slow, effortful cadence and min instability but no assist needed to prevent LOB; chair follow for safety   Stairs             Wheelchair Mobility    Modified Rankin (Stroke Patients Only)       Balance Overall balance assessment: Needs assistance Sitting-balance support: Feet supported Sitting balance-Leahy Scale: Good     Standing balance support: Bilateral upper extremity supported;During functional activity Standing balance-Leahy Scale: Fair Standing balance comment: Mod lean on the RW for support                            Cognition Arousal/Alertness: Awake/alert Behavior During Therapy: [facility] for tasks  assessed/performed Overall Cognitive Status: Within Functional Limits for tasks assessed                                        Exercises Total Joint Exercises Ankle Circles/Pumps: AROM;Both;10 reps;Strengthening;5 reps Quad Sets: Strengthening;Both;10 reps;5 reps Gluteal Sets: Strengthening;Both;10 reps;5 reps Towel Squeeze:  Strengthening;Both;10 reps Heel Slides: Strengthening;Both;5 reps Hip ABduction/ADduction: AROM;10 reps;Both;Strengthening Straight Leg Raises: AROM;Both;10 reps;Strengthening Long Arc Quad: AROM;Strengthening;Both;10 reps;5 reps Knee Flexion: AROM;Strengthening;Both;10 reps;5 reps Marching in Standing: AROM;Strengthening;Both;10 reps;Seated;Standing    General Comments        Pertinent Vitals/Pain Pain Assessment: 0-10 Pain Score: 7  Pain Location: headache, chronic back pain Pain Descriptors / Indicators: Headache Pain Intervention(s): Premedicated before session;Monitored during session    Home Living                      Prior Function            PT Goals (current goals can now be found in the care plan section) Progress towards PT goals: Progressing toward goals    Frequency    Min 2X/week      PT Plan Current plan remains appropriate    Co-evaluation              AM-PAC PT "6 Clicks" Mobility   Outcome Measure  Help needed turning from your back to your side while in a flat bed without using bedrails?: None Help needed moving from lying on your back to sitting on the side of a flat bed without using bedrails?: None Help needed moving to and from a bed to a chair (including a wheelchair)?: A Little Help needed standing up from a chair using your arms (e.g., wheelchair or bedside chair)?: A Little Help needed to walk in hospital room?: A Little Help needed climbing 3-5 steps with a railing? : A Lot 6 Click Score: 19    End of Session Equipment Utilized During Treatment: Gait belt Activity Tolerance: Patient tolerated treatment well Patient left: in bed;with call bell/phone within reach;with bed alarm set Nurse Communication: Mobility status PT Visit Diagnosis: Unsteadiness on feet (R26.81);Muscle weakness (generalized) (M62.81);History of falling (Z91.81);Other abnormalities of gait and mobility (R26.89)     Time: [id number] PT Time  Calculation (min) (ACUTE ONLY): 41 min  Charges:  $Gait Training: 8-22 mins $Therapeutic Exercise: 8-22 mins $Therapeutic Activity: 8-22 mins                     D. Scott Walida Cajas PT, DPT 06/21/20, 1:34 PM

## 2020-06-21 NOTE — Progress Notes (Signed)
[facility], [name] 06/21/20  Subjective:   LOS: 33  Patient seen in bed preparing to work with PT. She continues to complain of a headache.  Rates it a 6-7/10 during the day, 8-9/10 at night Alert and oriented Denies shortness of breath Able to eat meals, Denies nausea    Objective:  Vital signs in last 24 hours:  Temp:  [98.3 F (36.8 C)-98.8 F (37.1 C)] 98.6 F (37 C) (02/08 1111) Pulse Rate:  [65-86] 75 (02/08 1111) Resp:  [11-19] 18 (02/08 1111) BP: (83-168)/(48-75) 108/71 (02/08 1111) SpO2:  [95 %-98 %] 96 % (02/08 1111)  Weight change:  Filed Weights   06/11/20 0658 06/14/20 0700 06/19/20 0100  Weight: 76.7 kg 76.1 kg 73.9 kg    Intake/Output:    Intake/Output Summary (Last 24 hours) at 06/21/2020 1201 Last data filed at 06/21/2020 0403 Gross per 24 hour  Intake 240 ml  Output 1439 ml  Net -1199 ml     Physical Exam: General:  Resting in bed, NAD  HEENT  oral mucous moist  Pulm/lungs Clear bilaterally   CVS/Heart  S1-S2 present   Abdomen:  soft, nondistended, nontender  Extremities:  No peripheral edema  Neurologic:  Awake, alert, oriented  Skin: No acute rashes or lesions  Access: RIJ permcath        Basic Metabolic Panel:  Recent Labs  Lab 06/17/20 1220 06/21/20 0433  NA 138 137  K 3.7 4.3  CL 103 102  CO2 28 27  GLUCOSE 105* 103*  BUN 21 29*  CREATININE 1.33* 2.15*  CALCIUM 7.9* 8.5*  MG  --  2.3  PHOS 2.9  --      CBC: Recent Labs  Lab 06/17/20 1220 06/21/20 0433  WBC 5.9 5.7  HGB 8.8* 9.8*  HCT 27.7* 30.8*  MCV 96.5 99.0  PLT 262 258      Lab Results  Component Value Date   HEPBSAG NON REACTIVE 06/10/2020   HEPBSAB NON REACTIVE 05/16/2020   HEPBIGM NON REACTIVE 05/16/2020      Microbiology:  No results found for this or any previous visit (from the past 240 hour(s)).  Coagulation Studies: No results for input(s): LABPROT, INR in the last 72 hours.  Urinalysis: No results  for input(s): COLORURINE, LABSPEC, PHURINE, GLUCOSEU, HGBUR, BILIRUBINUR, KETONESUR, PROTEINUR, UROBILINOGEN, NITRITE, LEUKOCYTESUR in the last 72 hours.  Invalid input(s): APPERANCEUR    Imaging: No results found.   Medications:   . sodium chloride    . albumin human 25 g (06/20/20 1143)   . carvedilol  12.5 mg Oral BID WC  . Chlorhexidine Gluconate Cloth  6 each Topical Q0600  . cholecalciferol  1,000 Units Oral Daily  . cloNIDine  0.1 mg Oral TID  . epoetin (EPOGEN/PROCRIT) injection  10,000 Units Intravenous Q M,W,F-HD  . feeding supplement  237 mL Oral BID BM  . heparin  5,000 Units Subcutaneous Q8H  . hydrALAZINE  25 mg Oral Q6H  . lidocaine  1 patch Transdermal Q24H  . melatonin  5 mg Oral QPM  . methocarbamol  500 mg Oral QID  . multivitamin  1 tablet Oral QHS  . polyethylene glycol  17 g Oral Daily  . senna-docusate  1 tablet Oral BID  . sevelamer carbonate  800 mg Oral TID WC  . sodium chloride flush  3 mL Intravenous Q12H  . topiramate  25 mg Oral BID  . torsemide  10 mg Oral Daily  . traZODone  50  mg Oral QPM  . vitamin B-12  100 mcg Oral Daily   sodium chloride, acetaminophen **OR** acetaminophen, ALPRAZolam, benzonatate, bisacodyl, chlorpheniramine-HYDROcodone, diphenhydrAMINE, ondansetron **OR** ondansetron (ZOFRAN) IV, prochlorperazine, promethazine, sodium chloride flush, traMADol  Assessment/ Plan:  65 y.o. female with  diastolic congestive heart failure, hypertension, diabetes mellitus type II, CVA was admitted on 04/30/2020 for  Principal Problem:   Volume overload Active Problems:   Type 2 diabetes mellitus with stage 5 chronic kidney disease (HCC)   Hypertension   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HCC)   Hyperkalemia   Pressure injury of skin   Pleural effusion   Acute heart failure with preserved ejection fraction (HFpEF) (HCC)   Chronic kidney disease (CKD), stage IV (severe) ([place])   Pulmonary hypertension, unspecified (HCC)   ATN  (acute tubular necrosis) (HCC)  Chronic kidney disease (CKD), stage IV (severe) ([place]) [N18.4] Volume overload [E87.70]   Patient moved from [place] in December 2021. She was following with nephrology there. Patient initiated on hemodialysis this admission. Patient has a prolonged hospitalization. She was initially admitted with volume overload and acute exacerbation of congestive heart failure. Failed IV diuretics and started on hemodialysis on 12/29. No outpatient dialysis due to patient's lack of insurance and not a resident of the state of [place]. Held dialysis for possible recovery however after several days, creatinine rising and volume accumulation. Patient then resumed on dialysis. She has been weaned down to twice a week  #. ESRD Volume and electrolyte status acceptable No plans for dialysis today.  Will receive dialysis M-F.   #Hypertension with CKD She continues hypertensive medication for improved BP control Current BP-108/71  #. Anemia of CKD  Lab Results  Component Value Date   HGB 9.8 (L) 06/21/2020   Epogen 10,000 units Mondays and Fridays with HD  #. Secondary hyperparathyroidism of renal origin N 25.81      Component Value Date/Time   PTH 70 (H) 05/12/2020 0946   Lab Results  Component Value Date   PHOS 2.9 06/17/2020   Continue sevelamer 800 mg with meals Will monitor bone mineral metabolism parameters   LOS: [place] 2/8/202212:01 [place], [place]

## 2020-06-21 NOTE — Evaluation (Signed)
Occupational Therapy RE-Evaluation Patient Details Name: Katie [name] MRN: [id number] DOB: January 24, 1956 Today's Date: 06/21/2020    History of Present Illness Patient is a 65 y.o. female with a history of diabetes mellitus type 2, stage IV CKD, heart failure, hypertension. Patient presented secondary to dyspnea with evidence of volume overload and pleural effusion. Diagnosed with moderate pericardial effusion, mild to moderate aortic insufficiency and aortic stenosis.  S/p R heart cath 12/28.  R temp fem cath placed 12/29; 1/3 R IJ permcath placed; 1/4 R temp fem cath removed.   Clinical Impression   Pt seen this session for re-evaluation of goals. Pt making steady progress towards occupational therapy goals. Pt is agreeable to OT intervention but does endorse headache pain 6/10 and medicated prior to session. Pt declined OOB activity and self care tasks. Pt is agreeable to B UE strengthening exercises with use of level 2 theraband. Pt performing 2 sets of 10 chest pulls, shoulder diagonals, alternating punches, and shoulder elevation exercises with min cuing for proper technique. Pt reports enjoying exercise and feeling like she is in a much "better mood and my pain is some less now". Exercise band left with pt to utilize in her free time. All needs within reach. Pt continues to benefit from OT intervention. Pt's goals of supervision level remain appropriate at this time.    Follow Up Recommendations  SNF;Supervision - Intermittent    Equipment Recommendations  Other (comment) (defer to next venue of care)       Precautions / Restrictions Precautions Precautions: Fall Precaution Comments: R IJ permcath; monitor orthostatics Restrictions Other Position/Activity Restrictions: Pt Orthostatic, monitor BP with change of position      Mobility Bed Mobility Overal bed mobility: Modified Independent Bed Mobility: Supine to Sit     Supine to sit: Modified independent (Device/Increase time)      General bed mobility comments: Extra time and effort and use of rail, patient soiled upon removing pure wick. She was unaware.    Transfers Overall transfer level: Needs assistance Equipment used: Rolling walker (2 wheeled) Transfers: Sit to/from Stand Sit to Stand: Min assist;From elevated surface         General transfer comment: Patient require bed to be elevated and min A for initial sit to stand and then CGA for subsequent transfers from recliner.    Balance Overall balance assessment: Needs assistance Sitting-balance support: Feet supported Sitting balance-Leahy Scale: Good     Standing balance support: Bilateral upper extremity supported;During functional activity Standing balance-Leahy Scale: Fair Standing balance comment: Mod lean on the RW for support                           ADL either performed or assessed with clinical judgement         Vision Patient Visual Report: No change from baseline              Pertinent Vitals/Pain Pain Assessment: 0-10 Pain Score: 6  Pain Location: headache, chronic back pain Pain Descriptors / Indicators: Headache Pain Intervention(s): Premedicated before session;Monitored during session;Limited activity within patient's tolerance              Cognition Arousal/Alertness: Awake/alert Behavior During Therapy: WFL for tasks assessed/performed Overall Cognitive Status: Within Functional Limits for tasks assessed                                 General  Comments: Pt A and O x4. Pt was pleasant throughout session      Exercises Total Joint Exercises Ankle Circles/Pumps: AROM;Both;10 reps;Strengthening;5 reps Quad Sets: Strengthening;Both;10 reps;5 reps Gluteal Sets: Strengthening;Both;10 reps;5 reps Towel Squeeze: Strengthening;Both;10 reps Heel Slides: Strengthening;Both;5 reps Hip ABduction/ADduction: AROM;10 reps;Both;Strengthening Straight Leg Raises: AROM;Both;10 reps;Strengthening Long  Arc Quad: AROM;Strengthening;Both;10 reps;5 reps Knee Flexion: AROM;Strengthening;Both;10 reps;5 reps Marching in Standing: AROM;Strengthening;Both;10 reps;Seated;Standing                         OT Goals(Current goals can be found in the care plan section) Acute Rehab OT Goals Patient Stated Goal: to go home OT Goal Formulation: With patient Time For Goal Achievement: 07/05/20 Potential to Achieve Goals: Good  OT Frequency: Min 2X/week    AM-PAC OT "6 Clicks" Daily Activity     Outcome Measure Help from another person eating meals?: None Help from another person taking care of personal grooming?: A Little Help from another person toileting, which includes using toliet, bedpan, or urinal?: A Little Help from another person bathing (including washing, rinsing, drying)?: A Little Help from another person to put on and taking off regular upper body clothing?: A Little Help from another person to put on and taking off regular lower body clothing?: A Little 6 Click Score: 19   End of Session Nurse Communication: Mobility status  Activity Tolerance: Patient tolerated treatment well Patient left: in bed;with call bell/phone within reach;with bed alarm set  OT Visit Diagnosis: Unsteadiness on feet (R26.81);History of falling (Z91.81);Pain;Muscle weakness (generalized) (M62.81)                Time: [id number] OT Time Calculation (min): 25 min Charges:  OT General Charges $OT Visit: 1 Visit OT Evaluation $OT Re-eval: 1 Re-eval OT Treatments $Therapeutic Exercise: 23-37 mins  Darleen Crocker, MS, OTR/L , CBIS ascom [phone]  06/21/20, 3:19 PM

## 2020-06-21 NOTE — Progress Notes (Signed)
OT Cancellation Note  Patient Details Name: Katie [name] MRN: [id number] DOB: 22-Jun-1955   Cancelled Treatment:    Reason Eval/Treat Not Completed: Other (comment). Pt with visitor in the room and reports having a phone interview shortly. Pt is very pleasant and requesting therapist to return in afternoon. OT will re-attempt as time allows.   Darleen Crocker, [place], OTR/L , CBIS ascom [phone]  06/21/20, 9:48 AM   06/21/2020, 9:47 AM

## 2020-06-21 NOTE — Progress Notes (Signed)
Spoke with patient about Medicare application, she stated that her friend has helped her to apply and had signed paperwork but does not have any updates. I asked her if she would follow up and she stated that she would tomorrow.

## 2020-06-21 NOTE — TOC Progression Note (Addendum)
Transition of Care [facility]) - Progression Note    Patient Details  Name: Tanita Palinkas MRN: [id number] Date of Birth: [birth year]  Transition of Care [facility]) CM/SW [place], LCSW Phone Number: 06/21/2020, 11:00 AM  Clinical Narrative:   CSW sent updated notes to all SNFs in [place]. Still waiting on LOG placement.   CSW reached out to Development worker, community for update. Inquired if patient qualifies for Medicare since she is on dialysis. Dialysis Coordinator Estill Bamberg to talk to patient about this.  Expected Discharge Plan: Skilled Nursing Facility Barriers to Discharge: Continued Medical Work up,Homeless with medical needs  Expected Discharge Plan and Services Expected Discharge Plan: [place] In-house Referral: Clinical Social Work Discharge Planning Services: Homebound not met per provider Post Acute Care Choice: [place] Living arrangements for the past 2 months: No permanent address (Lived with Sister and family)                 DME Arranged: N/A DME Agency: NA       HH Arranged: NA HH Agency: NA         Social Determinants of Health (SDOH) Interventions    Readmission Risk Interventions No flowsheet data found.

## 2020-06-21 NOTE — Progress Notes (Addendum)
Nutrition Follow-up  DOCUMENTATION CODES:   Not applicable  INTERVENTION:  Continue Ensure Enlive po BID, each supplement provides 350 kcal and 20 grams of protein. Patient prefers chocolate.  Provide Magic cup BID with lunch and dinner, each supplement provides 290 kcal and 9 grams of protein. Patient prefers orange.  Continue Rena-vit po QHS.  Encouraged ongoing adequate intake from meals, snacks, and oral nutrition supplements.  NUTRITION DIAGNOSIS:   Increased nutrient needs related to chronic illness (CKD IV with temp HD, CHF) as evidenced by estimated needs.  Ongoing.  GOAL:   Patient will meet greater than or equal to 90% of their needs  Progressing.  MONITOR:   PO intake,Supplement acceptance,Labs,Weight trends,Skin,I & O's  REASON FOR ASSESSMENT:   LOS    ASSESSMENT:   65 y/o female with h/o CKD IV, CHF, DM and [place] who is admitted with AKI and volume overload.  Met with patient at bedside. She reports her appetite is good. She reports she continues to eat 100% of her meals. Limited documentation in chart but of the meals that are documented they are mainly 100% except for 50% of breakfast on 2/4 and 0% of breakfast on 2/7 (suspect could be related to HD as she is dialyzed M/W/F). She is drinking Ensure chocolate BID but does not feel she could drink a third each day. Discussed concern over weight loss. Patient reports she feels it was mostly fluid and not true body weight. She does not want to try Nepro but is willing to try Magic Cup. Patient also had snacks at bedside that she eats between meals.  Medications reviewed and include: vitamin D3 1000 units daily, Epogen 10000 units with HD, Rena-vit QHS, Miralax 17 grams daily, senna-docusate 1 tablet BID, Renvela 800 mg TID with meals, vitamin B12 100 micrograms daily, human albumin 25 grams with HD.  Labs reviewed: BUN 29, Creatinine 2.15.  I/O: 300 mL UOP yesterday; 1139 mL removed from HD yesterday  Weight  trend: 73.9 kg on 2/6; -2 kg from 1/25  Diet Order:   Diet Order            Diet 2 gram sodium Room service appropriate? Yes; Fluid consistency: Thin; Fluid restriction: 1200 mL Fluid  Diet effective now                EDUCATION NEEDS:   Education needs have been addressed  Skin:  Skin Assessment: Skin Integrity Issues: Skin Integrity Issues:: Stage II Stage II: coccyx  Last BM:  06/21/2020 - type 6  Height:   Ht Readings from Last 1 Encounters:  05/16/20 $RemoveB'5\' 6"'SgReoOMs$  (1.676 m)   Weight:   Wt Readings from Last 1 Encounters:  06/19/20 73.9 kg   Ideal Body Weight:  59 kg  BMI:  Body mass index is 26.3 kg/m.  Estimated Nutritional Needs:   Kcal:  1900-2200kcal/day  Protein:  95-110g/day  Fluid:  UOP +1L  Jacklynn Barnacle, MS, RD, LDN Pager number available on Amion

## 2020-06-21 NOTE — Progress Notes (Signed)
PROGRESS NOTE    Katie [name]  [id number] DOB: Nov 12, 1955 DOA: 04/30/2020 PCP: Patient, [name]   Brief Narrative: Taken from prior notes. Yanika Channel a 65 y.o.female with a history of diabetes mellitus type 2, stage IV CKD, heart failure, hypertension. Patient presented secondary to dyspnea with evidence of volume overload and pleural effusion.  Patient has had an extended hospital course. Her acute medical issues have essentially resolved at this point. She is medically stable for discharge however we do not have a safe disposition plan. Patient does not have stable living and lives with a friend here. She states that the friend likely will not be able to care for her.  TOC working to find placement however multiple facilities have refused at this point. Senior case management involved. Expanding search.  Patient remains stable.Continues to be very unmotivated always having pain complaints when PT attempted to work with her however then later complains of buttock pain from sitting too much. Been reinforce the need for exertion and ambulation daily basis.  Pt also very orthostatic and having orthostatics checked daily.remained positive for orthostasis despite starting on midodrine and TED hose, will try abdominal binder, most likely autonomic dysfunction.   Subjective: Pt reported headache better today, just a 5.  PT noted improved motivation and progression with mobility.     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)   Hyperkalemia   Pressure injury of skin   Pleural effusion   Acute heart failure with preserved ejection fraction (HFpEF) (HCC)   Chronic kidney disease (CKD), stage IV (severe) ([place])   Pulmonary hypertension, unspecified (HCC)   ATN (acute tubular necrosis)  (HCC)   Acute on chronic diastolic CHF   Echo done in December 2021 with normal EF and grade 2 diastolic dysfunction.Associated RV systolic dysfunction with elevated PA pressure seen on TTE although RHC on 12/28 significant for mildly elevated right heart/pulmonary artery pressures.  She was initially admitted with volume overload.  Initially managed with IV Lasix, progress to ESRD requiring dialysis, currently volume is being managed with dialysis.  Appears euvolemic. Plan: --Continue with dialysis. --cont torsemide 10 mg daily  Pleural effusion In setting of fluid overload with new diagnosis of grade 2 diastolic dysfunction  Echo 05/01/20 with nml EF S/p diagnostic Rt thoracentesis performed on 12/20 No evidence of maligancy on cytology  Hx of DM2, not currently active  CBG well controlled and A1c of 4.7 which is against the diagnosis of diabetes.  Orthostatic hypotension.   Patient remained significantly orthostatic positive, most likely autonomic dysfunction.  Baseline blood pressure elevated. --scheduled midodrine d/c'ed due to severely elevated BP 190's-200's Plan: --cont TED hose --cont PT  ESRD MF Unknown baseline.  Hx/o NSADS Patient is unsure of her primary nephrologist in [place].  Creatinine of 2.97 on admission.  Per patient, she was planned to start hemodialysis in addition to consideration for renal transplant.  Worsening renal function in setting of attempted Lasix diuresis for heart failure.  She was started on dialysis by nephrology. Patient on midodrine 10 mg on dialysis days due to orthostatic hypotension which she becomes very symptomatic Expected [place] Pending financial clearance for discharge planning Plan: --dialysis per nephrology --midodrine PRN with dialysis, per nephrology  Headache, persistent History of chronic headache.  Patient states she takes  Imitrex as needed.  --tapered opioids from oxycodone to tramadol Plan: --Neuro  consult today, recommended: --rapid taper of opioids (tramadol reduced to 50 mg q8h PRN today) --d/c Imitrex (have overused) --start venlafaxine, tizanidine, Topamax and Depakote --switch clonidine to patch for more even BP control --tylenol 1g q8h PRN --Outpatient neurology follow-up for nerve blocks, botox and CGRP inhibitor consideration   Moderate pericardial effusion No clinical evidence of tamponade.  No recent infections.  In setting of CKD V with elevated BUN. No chest pain. -cardiology rec. Outpatient Echo for surveillance  Mild/moderate aortic stenosis Unsure if this is symptomatic.  Patient does have dyspnea with exertion but this may be multifactorial. -Cardiology recommendations: outpatient Transthoracic Echocardiogram for surveillance.  Essential hypertension.   On multiple meds which include carvedilol, losartan, torsemide along with midodrine to see if that will help with her positive orthostatic vitals. --scheduled midodrine 10 mg TID d/c'ed on 2/4. -Losartan was discontinued and she was started on clonidine by nephrology  --BP varied widely. Plan: --cont coreg 12.5 mg BID --switch clonidine to 0.1 mg patch q weekly. --cont torsemide 10 mg daily --d/c hydralazine  Anemia of CKD Possible related to underlying kidney disease.  Evidence of macrocytosis on CBC.  TIBC low but otherwise normal iron panel. Vitamin B12 level 358 --Epogen with dialysis.  Pressure injury Mid coccyx, POA  History of tobacco use Patient has quit for at least 10 years. Previously, 20 pack year history.  Insomnia --cont melatonin and trazodone nightly --d/c night-time vital checks  Obesity Body mass index is 30.91 kg/m.  Objective: Vitals:   06/21/20 0403 06/21/20 0805 06/21/20 1111 06/21/20 1624  BP: (!) 161/73 (!) 164/75 108/71 (!) 128/56  Pulse: 80 76 75 75  Resp: '16 19 18 16  '$ Temp: 98.4 F (36.9 C) 98.8 F (37.1 C) 98.6 F (37 C) 98.8 F (37.1 C)  TempSrc:   Oral Oral Oral  SpO2: 97% 97% 96% 97%  Weight:      Height:        Intake/Output Summary (Last 24 hours) at 06/21/2020 1912 Last data filed at 06/21/2020 1600 Gross per 24 hour  Intake 240 ml  Output 300 ml  Net -60 ml   Filed Weights   06/11/20 0658 06/14/20 0700 06/19/20 0100  Weight: 76.7 kg 76.1 kg 73.9 kg    Examination:  Constitutional: NAD, AAOx3 HEENT: conjunctivae and lids normal, EOMI CV: No cyanosis.   RESP: normal respiratory effort, on RA Extremities: No effusions, edema in BLE SKIN: warm, dry Neuro: II - XII grossly intact.   Psych: better mood and affect.  Appropriate judgement and reason   DVT prophylaxis: Heparin Code Status: Full Family Communication:  Disposition Plan:  Status is: Inpatient  Remains inpatient appropriate because:Unsafe d/c plan, TOC supervisor is involved as she still does not has any place who will take LOG from [facility].  Difficult disposition due to unsafe discharge, apparently she was like living with a friend who cannot take care of her anymore.  TOC is working for places accepting facility.   Dispo: The patient is from: Home              Anticipated d/c is to: SNF              Anticipated d/c date is: whenever SNF bed available              Patient currently is medically stable to d/c.   Difficult to place patient Yes  Level of care: Med-Surg  Consultants:   Nephrology  Procedures:  Antimicrobials:   Data Reviewed: I have personally reviewed following labs and imaging studies  CBC: Recent Labs  Lab 06/17/20 1220 06/21/20 0433  WBC 5.9 5.7  HGB 8.8* 9.8*  HCT 27.7* 30.8*  MCV 96.5 99.0  PLT 262 0000000   Basic Metabolic Panel: Recent Labs  Lab 06/17/20 1220 06/21/20 0433  NA 138 137  K 3.7 4.3  CL 103 102  CO2 28 27  GLUCOSE 105* 103*  BUN 21 29*  CREATININE 1.33* 2.15*  CALCIUM 7.9* 8.5*  MG  --  2.3  PHOS 2.9  --    GFR: Estimated Creatinine Clearance: 27.9 mL/min (A) (by C-G formula based  on SCr of 2.15 mg/dL (H)). Liver Function Tests: Recent Labs  Lab 06/17/20 1220 06/21/20 0433  AST  --  13*  ALT  --  9  ALKPHOS  --  50  BILITOT  --  0.8  PROT  --  4.9*  ALBUMIN 2.5* 2.7*   No results for input(s): LIPASE, AMYLASE in the last 168 hours. No results for input(s): AMMONIA in the last 168 hours. Coagulation Profile: No results for input(s): INR, PROTIME in the last 168 hours. Cardiac Enzymes: No results for input(s): CKTOTAL, CKMB, CKMBINDEX, TROPONINI in the last 168 hours. BNP (last 3 results) No results for input(s): PROBNP in the last 8760 hours. HbA1C: No results for input(s): HGBA1C in the last 72 hours. CBG: No results for input(s): GLUCAP in the last 168 hours. Lipid Profile: No results for input(s): CHOL, HDL, LDLCALC, TRIG, CHOLHDL, LDLDIRECT in the last 72 hours. Thyroid Function Tests: No results for input(s): TSH, T4TOTAL, FREET4, T3FREE, THYROIDAB in the last 72 hours. Anemia Panel: No results for input(s): VITAMINB12, FOLATE, FERRITIN, TIBC, IRON, RETICCTPCT in the last 72 hours. Sepsis Labs: No results for input(s): PROCALCITON, LATICACIDVEN in the last 168 hours.  No results found for this or any previous visit (from the past 240 hour(s)).   Radiology Studies: No results found.  Scheduled Meds: . carvedilol  12.5 mg Oral BID WC  . Chlorhexidine Gluconate Cloth  6 each Topical Q0600  . cholecalciferol  1,000 Units Oral Daily  . cloNIDine  0.1 mg Transdermal Weekly  . [START ON 06/22/2020] divalproex  250 mg Oral Q8H   Followed by  . [START ON 06/24/2020] divalproex  250 mg Oral Q12H   Followed by  . [START ON 06/26/2020] divalproex  250 mg Oral QHS  . epoetin (EPOGEN/PROCRIT) injection  10,000 Units Intravenous Q M,W,F-HD  . feeding supplement  237 mL Oral BID BM  . heparin  5,000 Units Subcutaneous Q8H  . lidocaine  1 patch Transdermal Q24H  . melatonin  5 mg Oral QPM  . multivitamin  1 tablet Oral QHS  . polyethylene glycol  17 g  Oral Daily  . senna-docusate  1 tablet Oral BID  . sevelamer carbonate  800 mg Oral TID WC  . sodium chloride flush  3 mL Intravenous Q12H  . tiZANidine  2 mg Oral QHS  . [START ON 06/22/2020] topiramate  25 mg Oral Daily  . [START ON 06/22/2020] topiramate  50 mg Oral QHS  . torsemide  10 mg Oral Daily  . traZODone  50 mg Oral QPM  . venlafaxine  37.5 mg Oral QHS  . vitamin B-12  100 mcg Oral Daily   Continuous Infusions: . sodium chloride    . albumin human 25 g (06/20/20 1143)  .  valproate sodium       LOS: 51 days     Enzo Bi, MD [facility]  If 7PM-7AM, please contact night-coverage [email or web address]  06/21/2020, 7:12 PM

## 2020-06-22 DIAGNOSIS — G4489 Other headache syndrome: Secondary | ICD-10-CM

## 2020-06-22 MED ORDER — BISACODYL 5 MG PO TBEC
5.0000 mg | DELAYED_RELEASE_TABLET | Freq: Every day | ORAL | Status: DC | PRN
  Administered 2020-06-22: 5 mg via ORAL
  Filled 2020-06-22: qty 1

## 2020-06-22 MED ORDER — BISACODYL 10 MG RE SUPP: 10.0000 mg | Freq: Every day | RECTAL | Status: DC | PRN

## 2020-06-22 NOTE — Progress Notes (Signed)
Neurology Progress Note  Patient ID: Katie [name] is a 65 y.o. with PMHx of  has a past medical history of 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[place]), Hypertension, Renal disorder, and Type 2 diabetes mellitus ([place]).  Initially consulted for: Headaches  Subjective: Patient reports that her headaches are overall worse than yesterday, and endorses several episodes of vomiting. Review of the [facility] indicates she is continuing to use tramadol She is tolerating the Depakote well  Exam: Vitals:   06/22/20 1537 06/22/20 2002  BP: (!) 183/91 (!) 185/78  Pulse: 82 80  Resp: 18 16  Temp: 98.4 F (36.9 C) 98.4 F (36.9 C)  SpO2: 98% 96%   Gen: In bed, comfortable  Resp: non-labored breathing, no grossly audible wheezing Cardiac: Perfusing extremities well  Abd: soft, nt  Neuro: MS: Sleeping but awakens easily.  Does not remember me initially CN: EOMI, mild right eye ptosis that is stable Motor: Moving all extremities freely antigravity, slightly less on the right  Pertinent Labs: AST 13, ALT 9  Impression: Continue to suspect underlying migraine headaches with component of medication overuse headache, exacerbated by demoralization  Recommendations: -Continue venlafaxine             Initial: 37.5 mg once daily; titrate cautiously, not to exceed 50% of the maximum recommended dose (50% of 225 mg = 112.5 mg daily).  -Continue Depakote 10 mg/kg completed on 2/8 (600 mg IV once), then 250 mg TID oral x 2 days, then 250 mg BID x 2 days, then 250 mg nightly x 2 days (ordered) -Tizanidine 4 mg QHS x 3 days (2/9-11), then PRN (robaxin discontinued) -Continue rapid wean of opiates per primary team, today was reduced to every 8 hours from every 6 hours -Avoid immitrex until March 8th 2022, try 25 mg dose at a limit of 9 doses per month, but no more than 3 doses per week, but no more than 2 doses per day (may be repeated within 2 hours once on a given day) -Clonidine patch in place -Tylenol  changed from 650 mg q6hr to 1000 mg q8hr -Outpatient neurology follow-up for nerve blocks, botox and CGRP inhibitor consideration -TED stockings off while in bed, on before activity. Consider abdominal binder before activity as well.  -Neurology will continue to follow, may consider additional imaging based on clinical course  Lesleigh Noe MD-PhD [facility] [phone]

## 2020-06-22 NOTE — Progress Notes (Signed)
PROGRESS NOTE    Katie [name]   [id number]  DOB: [birth year]  PCP: Patient, [name]    DOA: 04/30/2020 LOS: 67   Brief Narrative   65 y.o. female with a history of diabetes mellitus type 2, stage IV CKD, heart failure, hypertension. Patient presented to the ED on 04/30/20 with complaints of worsening dyspnea with evidence of volume overload and pleural effusion.  Hospital stay has been prolonged due to disposition challenges and no safe discharge plan, due to lack of insurance difficulty with SNF placement in patient too profoundly weak to safely return home.    Acute medical issues have resolved and patient is medically stable for discharge once adequate plans are in place.  Senior case management involved and bed search has been expanded.  Ongoing issue with orthostatic hypotension, started on midodrine and TED hose.  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    Assessment & Plan   Principal Problem:   Volume overload Active Problems:   Type 2 diabetes mellitus with stage 5 chronic kidney disease (HCC)   Hypertension   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HCC)   Hyperkalemia   Pressure injury of skin   Pleural effusion   Acute heart failure with preserved ejection fraction (HFpEF) (HCC)   Chronic kidney disease (CKD), stage IV (severe) ([place])   Pulmonary hypertension, unspecified (HCC)   ATN (acute tubular necrosis) (HCC)   Acute on chronic diastolic CHF -echo in December 2021 showed normal EF and grade 2 diastolic dysfunction, associated RV systolic dysfunction and elevated PA pressure seen on TTE however right heart cath on 12/28 significantly for mildly elevated pulmonary artery pressures. Presented volume overloaded, initially managed with IV Lasix but had worsening renal function.  Unfortunately her CKD has progressed to ESRD and now on dialysis.   Currently appears euvolemic on exam. --Continue dialysis per  nephrology --Continue torsemide 10 mg daily --Monitor volume status closely: Strict I/O's and daily weights   Pleural effusion, right side -likely due to volume overload in the setting of diastolic dysfunction and renal failure.  Status post right thoracentesis on 12/20.  No evidence of malignancy was seen on cytology. Monitor.   Orthostatic hypotension -likely secondary to autonomic dysfunction.  Has baseline elevated blood pressures.  Was tried on midodrine which had to be stopped due to systolic pressures severely elevated 190s to 200s. --Continue TED hose --Continue working with PT as much as possible --Recheck orthostatics periodically --Continue midodrine on dialysis days   Headache, persistent /history of chronic migraine headaches -patient reports using Imitrex as needed.  Has been transitioned from oxycodone to tramadol. --Neurology consulted, recommendations: --rapid taper of opioids (tramadol reduced to 50 mg q8h PRN) --d/c Imitrex (have overused) --start venlafaxine, tizanidine, Topamax and Depakote --switch clonidine to patch for more even BP control  --tylenol 1g q8h PRN --Outpatient neurology follow-up for nerve blocks, botox and CGRP inhibitor consideration    ESRD -unknown baseline renal function.  Patient unsure primary nephrologist name in [place]. Presented with creatinine 2.97.  Patient reported plan was to start dialysis and consideration of future renal transplant.  Renal function worsened over course of admission secondary to diuresis for acute CHF and volume overload.  She has been started on dialysis.   Anticipate outpatient dialysis will be [place]. --Nephrology following --Midodrine PRS with dialysis, per nephro   Pericardial effusion, moderate -seen on echo, no evidence of tamponade.  No recent infections to attribute this to.  In the setting of CKD 5 and elevated BUN, likely secondary to uremia.  Patient has no chest pain.  Cardiology  recommended outpatient echo for surveillance.   Essential hypertension -on multiple antihypertensive agents including Coreg, Cozaar, torsemide. Had been tried on midodrine for orthostasis but stopped due to severely elevated pressures.  Losartan discontinued and started on clonidine by nephrology.  BP very labile. --Continue Coreg 12.5 mg twice daily --Continue clonidine 0.1 mg patch every 7 days --Continue torsemide 10 mg daily   Anemia of chronic renal disease -with macrocytosis on CBC.  TIBC low but otherwise normal iron studies.  Vitamin B12 level 358.  Continue EPO with dialysis per renal.  Monitor CBC.   History of type 2 diabetes -last A1c 4.7%, not currently diabetic and CBGs well controlled.     History of tobacco abuse -quit at least 10 years ago, prior 20-pack-year history.   Insomnia -continue melatonin and trazodone.  No vitals overnight when patient sleeping, and minimize interruptions in sleep.   Pressure injury, mid coccyx -present on admission. Pressure Injury 04/30/20 Coccyx Mid Stage 2 -  Partial thickness loss of dermis presenting as a shallow open injury with a red, pink wound bed without slough. (Active)  04/30/20 2130  Location: Coccyx  Location Orientation: Mid  Staging: Stage 2 -  Partial thickness loss of dermis presenting as a shallow open injury with a red, pink wound bed without slough.  Wound Description (Comments):   Present on Admission: Yes     DVT prophylaxis: heparin injection 5,000 Units Start: 05/10/20 2200   Diet:  Diet Orders (From admission, onward)    Start     Ordered   05/31/20 1638  Diet 2 gram sodium Room service appropriate? Yes; Fluid consistency: Thin; Fluid restriction: 1200 mL Fluid  Diet effective now       Question Answer Comment  Room service appropriate? Yes   Fluid consistency: Thin   Fluid restriction: 1200 mL Fluid      05/31/20 1638            Code Status: Full Code    Subjective 06/22/20    Patient seen  this morning at bedside.  Reports she still has headache.  Otherwise has baseline neck and back pain which bother her most of the time.  Has not had a BM in several days.   Disposition Plan & Communication   Status is: Inpatient  Remains inpatient appropriate because:Unsafe discharge, disposition planning underway.  Difficult placement due to lack of insurance.   Dispo: The patient is from: Home              Anticipated d/c is to: SNF              Anticipated d/c date is: Pending SNF bed              Patient currently is medically stable to d/c.   Difficult to place patient Yes    Consults, Procedures, Significant Events   Consultants:   Nephrology  Procedures:   Dialysis  Antimicrobials:  Anti-infectives (From admission, onward)   Start     Dose/Rate Route Frequency Ordered Stop   05/16/20 0905  clindamycin (CLEOCIN) 300 MG/50ML IVPB       Note to Pharmacy: Carlynn Spry   : cabinet override      05/16/20 0905 05/16/20 0920        Objective   Vitals:   06/22/20 0534 06/22/20 0655 06/22/20 0716 06/22/20 1113  BP: Marland Kitchen)  172/77  (!) 186/78 (!) 184/73  Pulse: 73  74 74  Resp: '15  18 16  '$ Temp: 98.3 F (36.8 C)  98.8 F (37.1 C) 98.8 F (37.1 C)  TempSrc: Oral  Oral Oral  SpO2: 97%  96% 95%  Weight:  73.5 kg    Height:        Intake/Output Summary (Last 24 hours) at 06/22/2020 1326 Last data filed at 06/22/2020 0602 Gross per 24 hour  Intake 240 ml  Output 1000 ml  Net -760 ml   Filed Weights   06/14/20 0700 06/19/20 0100 06/22/20 0655  Weight: 76.1 kg 73.9 kg 73.5 kg    Physical Exam:  General exam: awake, alert, no acute distress Respiratory system: CTAB diminished bases, no wheezes, normal respiratory effort. Cardiovascular system: normal S1/S2, RRR, no pedal edema.   Gastrointestinal system: soft, NT, ND, +bowel sounds. Central nervous system: A&O x3. no gross focal neurologic deficits, normal speech Skin: dry, intact, normal  temperature    Labs   Data Reviewed: I have personally reviewed following labs and imaging studies  CBC: Recent Labs  Lab 06/17/20 1220 06/21/20 0433  WBC 5.9 5.7  HGB 8.8* 9.8*  HCT 27.7* 30.8*  MCV 96.5 99.0  PLT 262 0000000   Basic Metabolic Panel: Recent Labs  Lab 06/17/20 1220 06/21/20 0433  NA 138 137  K 3.7 4.3  CL 103 102  CO2 28 27  GLUCOSE 105* 103*  BUN 21 29*  CREATININE 1.33* 2.15*  CALCIUM 7.9* 8.5*  MG  --  2.3  PHOS 2.9  --    GFR: Estimated Creatinine Clearance: 27.8 mL/min (A) (by C-G formula based on SCr of 2.15 mg/dL (H)). Liver Function Tests: Recent Labs  Lab 06/17/20 1220 06/21/20 0433  AST  --  13*  ALT  --  9  ALKPHOS  --  50  BILITOT  --  0.8  PROT  --  4.9*  ALBUMIN 2.5* 2.7*   No results for input(s): LIPASE, AMYLASE in the last 168 hours. No results for input(s): AMMONIA in the last 168 hours. Coagulation Profile: No results for input(s): INR, PROTIME in the last 168 hours. Cardiac Enzymes: No results for input(s): CKTOTAL, CKMB, CKMBINDEX, TROPONINI in the last 168 hours. BNP (last 3 results) No results for input(s): PROBNP in the last 8760 hours. HbA1C: No results for input(s): HGBA1C in the last 72 hours. CBG: No results for input(s): GLUCAP in the last 168 hours. Lipid Profile: No results for input(s): CHOL, HDL, LDLCALC, TRIG, CHOLHDL, LDLDIRECT in the last 72 hours. Thyroid Function Tests: No results for input(s): TSH, T4TOTAL, FREET4, T3FREE, THYROIDAB in the last 72 hours. Anemia Panel: No results for input(s): VITAMINB12, FOLATE, FERRITIN, TIBC, IRON, RETICCTPCT in the last 72 hours. Sepsis Labs: No results for input(s): PROCALCITON, LATICACIDVEN in the last 168 hours.  No results found for this or any previous visit (from the past 240 hour(s)).    Imaging Studies   No results found.   Medications   Scheduled Meds: . carvedilol  12.5 mg Oral BID WC  . Chlorhexidine Gluconate Cloth  6 each Topical Q0600   . cholecalciferol  1,000 Units Oral Daily  . cloNIDine  0.1 mg Transdermal Weekly  . divalproex  250 mg Oral Q8H   Followed by  . [START ON 06/24/2020] divalproex  250 mg Oral Q12H   Followed by  . [START ON 06/26/2020] divalproex  250 mg Oral QHS  . epoetin (EPOGEN/PROCRIT) injection  10,000  Units Intravenous Q M,W,F-HD  . feeding supplement  237 mL Oral BID BM  . heparin  5,000 Units Subcutaneous Q8H  . lidocaine  1 patch Transdermal Q24H  . melatonin  5 mg Oral QPM  . multivitamin  1 tablet Oral QHS  . polyethylene glycol  17 g Oral Daily  . senna-docusate  1 tablet Oral BID  . sevelamer carbonate  800 mg Oral TID WC  . sodium chloride flush  3 mL Intravenous Q12H  . tiZANidine  2 mg Oral QHS  . topiramate  25 mg Oral Daily  . topiramate  50 mg Oral QHS  . torsemide  10 mg Oral Daily  . traZODone  50 mg Oral QPM  . venlafaxine  37.5 mg Oral QHS  . vitamin B-12  100 mcg Oral Daily   Continuous Infusions: . sodium chloride    . albumin human 25 g (06/20/20 1143)       LOS: 52 days    Time spent: 30 minutes with > 50% spent in coordination of care and at bedside.    Ezekiel Slocumb, DO [facility]  06/22/2020, 1:26 PM      If 7PM-7AM, please contact night-coverage. How to contact the [facility] Attending or Consulting provider [place] or covering provider during after hours [place], for this patient?    1. Check the care team in [facility] and look for a) attending/consulting [facility] provider listed and b) the [facility] team listed 2. Log into [email or web address] and use [place]'s universal password to access. If you do not have the password, please contact the hospital operator. 3. Locate the [facility] provider you are looking for under [facility] and page to a number that you can be directly reached. 4. If you still have difficulty reaching the provider, please page the [facility] (Director on Call) for the Hospitalists listed on amion for assistance.

## 2020-06-22 NOTE — Plan of Care (Signed)

## 2020-06-22 NOTE — Hospital Course (Addendum)
65 y.o. female with a history of diabetes mellitus type 2, stage IV CKD, heart failure, hypertension. Patient presented to the ED on 04/30/20 with complaints of worsening dyspnea with evidence of volume overload and pleural effusion.  Hospital stay has been prolonged due to disposition challenges and no safe discharge plan, due to lack of insurance difficulty with SNF placement in patient too profoundly weak to safely return home.    Acute medical issues have resolved and patient is medically stable for discharge once adequate plans are in place.  Senior case management involved and bed search has been expanded.  Ongoing issues are orthostatic hypotension and headaches.  Neurology consulted and patient on multiple agents for prevention and abortive therapy.  Now off opiates completely so hopeful overuse headache component continues to improve.

## 2020-06-22 NOTE — Progress Notes (Signed)
[facility], [name] 06/22/20  Subjective:   LOS: 52  Patient seen in bed with covers pulled over her head Continues to have headaches Patient says neurologist started new medications for headaches Denies shortness of breath and nausea   Objective:  Vital signs in last 24 hours:  Temp:  [98.3 F (36.8 C)-98.9 F (37.2 C)] 98.8 F (37.1 C) (02/09 0716) Pulse Rate:  [73-76] 74 (02/09 0716) Resp:  [15-18] 18 (02/09 0716) BP: (108-186)/(56-78) 186/78 (02/09 0716) SpO2:  [96 %-97 %] 96 % (02/09 0716) Weight:  [73.5 kg] 73.5 kg (02/09 0655)  Weight change:  Filed Weights   06/14/20 0700 06/19/20 0100 06/22/20 0655  Weight: 76.1 kg 73.9 kg 73.5 kg    Intake/Output:    Intake/Output Summary (Last 24 hours) at 06/22/2020 1017 Last data filed at 06/22/2020 0602 Gross per 24 hour  Intake 240 ml  Output 1000 ml  Net -760 ml     Physical Exam: General:  Resting in bed, NAD  HEENT  oral mucous moist  Pulm/lungs Clear bilaterally   CVS/Heart  S1-S2 present, no chest pain   Abdomen:  soft, nondistended, nontender  Extremities:  Trace peripheral edema  Neurologic:  Awake, alert, oriented  Skin: No acute rashes or lesions  Access: RIJ permcath        Basic Metabolic Panel:  Recent Labs  Lab 06/17/20 1220 06/21/20 0433  NA 138 137  K 3.7 4.3  CL 103 102  CO2 28 27  GLUCOSE 105* 103*  BUN 21 29*  CREATININE 1.33* 2.15*  CALCIUM 7.9* 8.5*  MG  --  2.3  PHOS 2.9  --      CBC: Recent Labs  Lab 06/17/20 1220 06/21/20 0433  WBC 5.9 5.7  HGB 8.8* 9.8*  HCT 27.7* 30.8*  MCV 96.5 99.0  PLT 262 258      Lab Results  Component Value Date   HEPBSAG NON REACTIVE 06/10/2020   HEPBSAB NON REACTIVE 05/16/2020   HEPBIGM NON REACTIVE 05/16/2020      Microbiology:  No results found for this or any previous visit (from the past 240 hour(s)).  Coagulation Studies: No results for input(s): LABPROT, INR in the last 72  hours.  Urinalysis: No results for input(s): COLORURINE, LABSPEC, PHURINE, GLUCOSEU, HGBUR, BILIRUBINUR, KETONESUR, PROTEINUR, UROBILINOGEN, NITRITE, LEUKOCYTESUR in the last 72 hours.  Invalid input(s): APPERANCEUR    Imaging: No results found.   Medications:   . sodium chloride    . albumin human 25 g (06/20/20 1143)   . carvedilol  12.5 mg Oral BID WC  . Chlorhexidine Gluconate Cloth  6 each Topical Q0600  . cholecalciferol  1,000 Units Oral Daily  . cloNIDine  0.1 mg Transdermal Weekly  . divalproex  250 mg Oral Q8H   Followed by  . [START ON 06/24/2020] divalproex  250 mg Oral Q12H   Followed by  . [START ON 06/26/2020] divalproex  250 mg Oral QHS  . epoetin (EPOGEN/PROCRIT) injection  10,000 Units Intravenous Q M,W,F-HD  . feeding supplement  237 mL Oral BID BM  . heparin  5,000 Units Subcutaneous Q8H  . lidocaine  1 patch Transdermal Q24H  . melatonin  5 mg Oral QPM  . multivitamin  1 tablet Oral QHS  . polyethylene glycol  17 g Oral Daily  . senna-docusate  1 tablet Oral BID  . sevelamer carbonate  800 mg Oral TID WC  . sodium chloride flush  3 mL Intravenous Q12H  .  tiZANidine  2 mg Oral QHS  . topiramate  25 mg Oral Daily  . topiramate  50 mg Oral QHS  . torsemide  10 mg Oral Daily  . traZODone  50 mg Oral QPM  . venlafaxine  37.5 mg Oral QHS  . vitamin B-12  100 mcg Oral Daily   sodium chloride, acetaminophen **OR** acetaminophen, ALPRAZolam, benzonatate, bisacodyl, ondansetron **OR** ondansetron (ZOFRAN) IV, prochlorperazine, sodium chloride flush, tiZANidine **FOLLOWED BY** [START ON 06/24/2020] tiZANidine, traMADol  Assessment/ Plan:  65 y.o. female with  diastolic congestive heart failure, hypertension, diabetes mellitus type II, CVA was admitted on 04/30/2020 for  Principal Problem:   Volume overload Active Problems:   Type 2 diabetes mellitus with stage 5 chronic kidney disease (HCC)   Hypertension   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 2 diabetes mellitus  (HCC)   Hyperkalemia   Pressure injury of skin   Pleural effusion   Acute heart failure with preserved ejection fraction (HFpEF) (HCC)   Chronic kidney disease (CKD), stage IV (severe) ([place])   Pulmonary hypertension, unspecified (HCC)   ATN (acute tubular necrosis) (HCC)  Chronic kidney disease (CKD), stage IV (severe) ([place]) [N18.4] Volume overload [E87.70]   Patient moved from [place] in December 2021. She was following with nephrology there. Patient initiated on hemodialysis this admission. Patient has a prolonged hospitalization. She was initially admitted with volume overload and acute exacerbation of congestive heart failure. Failed IV diuretics and started on hemodialysis on 12/29. No outpatient dialysis due to patient's lack of insurance and not a resident of the state of [place]. Held dialysis for possible recovery however after several days, creatinine rising and volume accumulation. Patient then resumed on dialysis. She has been weaned down to twice a week  #. ESRD We will continue to monitor labs, volume and electrolytes within acceptable parameters at this time.  Next scheduled dialysis will be on Friday  #Hypertension with CKD Continues to be hypertensive while laying in bed.  Better control with prescribed medications.   #. Anemia of CKD -Improving HGB levels Lab Results  Component Value Date   HGB 9.8 (L) 06/21/2020   Continue EPO with HD  #. Secondary hyperparathyroidism of renal origin N 25.81      Component Value Date/Time   PTH 70 (H) 05/12/2020 0946   Lab Results  Component Value Date   PHOS 2.9 06/17/2020   Ordered Sevelamer with meals   LOS: [place], NP 2/9/202210:17 E[place], [place]

## 2020-06-23 LAB — GLUCOSE, CAPILLARY
Glucose-Capillary: 120 mg/dL — ABNORMAL HIGH (ref 70–99)
Glucose-Capillary: 129 mg/dL — ABNORMAL HIGH (ref 70–99)
Glucose-Capillary: 102 mg/dL — ABNORMAL HIGH (ref 70–99)

## 2020-06-23 MED ORDER — DIPHENHYDRAMINE HCL 50 MG/ML IJ SOLN
12.5000 mg | Freq: Two times a day (BID) | INTRAMUSCULAR | Status: DC | PRN
  Administered 2020-06-23 – 2020-07-01 (×7): 12.5 mg via INTRAVENOUS
  Filled 2020-06-23 (×7): qty 1

## 2020-06-23 MED ORDER — TRAMADOL HCL 50 MG PO TABS
50.0000 mg | ORAL_TABLET | Freq: Two times a day (BID) | ORAL | Status: DC | PRN
  Administered 2020-06-23 – 2020-06-25 (×2): 50 mg via ORAL
  Filled 2020-06-23 (×2): qty 1

## 2020-06-23 NOTE — Progress Notes (Signed)
PROGRESS NOTE    Katie [name]   [id number]  DOB: [birth year]  PCP: Patient, [name]    DOA: 04/30/2020 LOS: 70   Brief Narrative   65 y.o. female with a history of diabetes mellitus type 2, stage IV CKD, heart failure, hypertension. Patient presented to the ED on 04/30/20 with complaints of worsening dyspnea with evidence of volume overload and pleural effusion.  Hospital stay has been prolonged due to disposition challenges and no safe discharge plan, due to lack of insurance difficulty with SNF placement in patient too profoundly weak to safely return home.    Acute medical issues have resolved and patient is medically stable for discharge once adequate plans are in place.  Senior case management involved and bed search has been expanded.  Ongoing issue with orthostatic hypotension, started on midodrine and TED hose.  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    Assessment & Plan   Principal Problem:   Volume overload Active Problems:   Type 2 diabetes mellitus with stage 5 chronic kidney disease (HCC)   Hypertension   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HCC)   Hyperkalemia   Pressure injury of skin   Pleural effusion   Acute heart failure with preserved ejection fraction (HFpEF) (HCC)   Chronic kidney disease (CKD), stage IV (severe) ([place])   Pulmonary hypertension, unspecified (HCC)   ATN (acute tubular necrosis) (HCC)   Acute on chronic diastolic CHF -echo in December 2021 showed normal EF and grade 2 diastolic dysfunction, associated RV systolic dysfunction and elevated PA pressure seen on TTE however right heart cath on 12/28 significantly for mildly elevated pulmonary artery pressures. Presented volume overloaded, initially managed with IV Lasix but had worsening renal function.  Unfortunately her CKD has progressed to ESRD and now on dialysis.   Currently appears euvolemic on exam. --Continue dialysis per  nephrology --Continue torsemide 10 mg daily --Monitor volume status closely: Strict I/O's and daily weights   Pleural effusion, right side -likely due to volume overload in the setting of diastolic dysfunction and renal failure.  Status post right thoracentesis on 12/20.  No evidence of malignancy was seen on cytology. Monitor.   Orthostatic hypotension -likely secondary to autonomic dysfunction.  Has baseline elevated blood pressures.  Was tried on midodrine which had to be stopped due to systolic pressures severely elevated 190s to 200s. --Continue TED hose --Continue working with PT as much as possible --Recheck orthostatics periodically --Continue midodrine on dialysis days   Headache, persistent / History of chronic migraine headaches / Medication Overuse Headaches -patient reports using Imitrex as needed.  Has been transitioned from oxycodone to tramadol. --Neurology consulted, recommendations: --tramadol further reduced to 50 mg q12h PRN --d/c Imitrex (has overused) --continue prophylactic treatment with venlafaxine, Topamax  --continue Depakote taper per orders, Tizanidine to help wean off opiates --switch clonidine to patch for more even BP control  --tylenol 1g q8h PRN  --Outpatient neurology follow-up for nerve blocks, botox and CGRP inhibitor consideration    ESRD -unknown baseline renal function.  Patient unsure primary nephrologist name in [place]. Presented with creatinine 2.97.  Patient reported plan was to start dialysis and consideration of future renal transplant.  Renal function worsened over course of admission secondary to diuresis for acute CHF and volume overload.  She has been started on dialysis.   Anticipate outpatient dialysis will be [place]. --Nephrology following --Midodrine PRS with dialysis, per nephro   Pericardial effusion, moderate -  seen on echo, no evidence of tamponade.  No recent infections to attribute this to.  In the setting of  CKD 5 and elevated BUN, likely secondary to uremia.  Patient has no chest pain.  Cardiology recommended outpatient echo for surveillance.   Essential hypertension -on multiple antihypertensive agents including Coreg, Cozaar, torsemide. Had been tried on midodrine for orthostasis but stopped due to severely elevated pressures.  Losartan discontinued and started on clonidine by nephrology.  BP very labile. --Continue Coreg 12.5 mg twice daily --Continue clonidine 0.1 mg patch every 7 days --Continue torsemide 10 mg daily   Anemia of chronic renal disease -with macrocytosis on CBC.  TIBC low but otherwise normal iron studies.  Vitamin B12 level 358.  Continue EPO with dialysis per renal.  Monitor CBC.   History of type 2 diabetes -last A1c 4.7%, not currently diabetic and CBGs well controlled.     History of tobacco abuse -quit at least 10 years ago, prior 20-pack-year history.   Insomnia -continue melatonin and trazodone.  No vitals overnight when patient sleeping, and minimize interruptions in sleep.   Pressure injury, mid coccyx -present on admission. Pressure Injury 04/30/20 Coccyx Mid Stage 2 -  Partial thickness loss of dermis presenting as a shallow open injury with a red, pink wound bed without slough. (Active)  04/30/20 2130  Location: Coccyx  Location Orientation: Mid  Staging: Stage 2 -  Partial thickness loss of dermis presenting as a shallow open injury with a red, pink wound bed without slough.  Wound Description (Comments):   Present on Admission: Yes     DVT prophylaxis: heparin injection 5,000 Units Start: 05/10/20 2200   Diet:  Diet Orders (From admission, onward)    Start     Ordered   05/31/20 1638  Diet 2 gram sodium Room service appropriate? Yes; Fluid consistency: Thin; Fluid restriction: 1200 mL Fluid  Diet effective now       Question Answer Comment  Room service appropriate? Yes   Fluid consistency: Thin   Fluid restriction: 1200 mL Fluid       05/31/20 1638            Code Status: Full Code    Subjective 06/23/20    Patient had a large BM yesterday evening.  States her abdominal pain resolved after that.  Headache improved today, is trying to minimize tramadol.  No other acute complaints.   Disposition Plan & Communication   Status is: Inpatient  Remains inpatient appropriate because:Unsafe discharge, disposition planning underway.  Difficult placement due to lack of insurance.   Dispo: The patient is from: Home              Anticipated d/c is to: SNF              Anticipated d/c date is: Pending SNF bed              Patient currently is medically stable to d/c.   Difficult to place patient Yes    Consults, Procedures, Significant Events   Consultants:   Nephrology  Procedures:   Dialysis  Antimicrobials:  Anti-infectives (From admission, onward)   Start     Dose/Rate Route Frequency Ordered Stop   05/16/20 0905  clindamycin (CLEOCIN) 300 MG/50ML IVPB       Note to Pharmacy: Carlynn Spry   : cabinet override      05/16/20 0905 05/16/20 0920        Objective   Vitals:   06/23/20  [id number] 06/23/20 1153 06/23/20 1614 06/23/20 1811  BP: (!) 142/80 (!) 121/53 (!) 167/74 (!) 174/72  Pulse: 75 72 78 75  Resp: '14 15 17   '$ Temp: 99 F (37.2 C) 98.6 F (37 C) 98.8 F (37.1 C)   TempSrc: Oral Oral Oral   SpO2: 97% 97% 98%   Weight:      Height:       No intake or output data in the 24 hours ending 06/23/20 1855 Filed Weights   06/14/20 0700 06/19/20 0100 06/22/20 0655  Weight: 76.1 kg 73.9 kg 73.5 kg    Physical Exam:  General exam: awake, alert, no acute distress Respiratory system: symmetric chest rise, normal respiratory effort. Cardiovascular system: normal S1/S2, RRR, no pedal edema.   Gastrointestinal system: softer than yesterday, NT, ND, +bowel sounds. Central nervous system: A&O x3. no gross focal neurologic deficits, normal speech Skin: dry, intact, normal temperature    Labs    Data Reviewed: I have personally reviewed following labs and imaging studies  CBC: Recent Labs  Lab 06/17/20 1220 06/21/20 0433  WBC 5.9 5.7  HGB 8.8* 9.8*  HCT 27.7* 30.8*  MCV 96.5 99.0  PLT 262 0000000   Basic Metabolic Panel: Recent Labs  Lab 06/17/20 1220 06/21/20 0433  NA 138 137  K 3.7 4.3  CL 103 102  CO2 28 27  GLUCOSE 105* 103*  BUN 21 29*  CREATININE 1.33* 2.15*  CALCIUM 7.9* 8.5*  MG  --  2.3  PHOS 2.9  --    GFR: Estimated Creatinine Clearance: 27.8 mL/min (A) (by C-G formula based on SCr of 2.15 mg/dL (H)). Liver Function Tests: Recent Labs  Lab 06/17/20 1220 06/21/20 0433  AST  --  13*  ALT  --  9  ALKPHOS  --  50  BILITOT  --  0.8  PROT  --  4.9*  ALBUMIN 2.5* 2.7*   No results for input(s): LIPASE, AMYLASE in the last 168 hours. No results for input(s): AMMONIA in the last 168 hours. Coagulation Profile: No results for input(s): INR, PROTIME in the last 168 hours. Cardiac Enzymes: No results for input(s): CKTOTAL, CKMB, CKMBINDEX, TROPONINI in the last 168 hours. BNP (last 3 results) No results for input(s): PROBNP in the last 8760 hours. HbA1C: No results for input(s): HGBA1C in the last 72 hours. CBG: Recent Labs  Lab 06/23/20 1153 06/23/20 1616  GLUCAP 120* 129*   Lipid Profile: No results for input(s): CHOL, HDL, LDLCALC, TRIG, CHOLHDL, LDLDIRECT in the last 72 hours. Thyroid Function Tests: No results for input(s): TSH, T4TOTAL, FREET4, T3FREE, THYROIDAB in the last 72 hours. Anemia Panel: No results for input(s): VITAMINB12, FOLATE, FERRITIN, TIBC, IRON, RETICCTPCT in the last 72 hours. Sepsis Labs: No results for input(s): PROCALCITON, LATICACIDVEN in the last 168 hours.  No results found for this or any previous visit (from the past 240 hour(s)).    Imaging Studies   No results found.   Medications   Scheduled Meds: . carvedilol  12.5 mg Oral BID WC  . Chlorhexidine Gluconate Cloth  6 each Topical Q0600  .  cholecalciferol  1,000 Units Oral Daily  . cloNIDine  0.1 mg Transdermal Weekly  . divalproex  250 mg Oral Q8H   Followed by  . [START ON 06/24/2020] divalproex  250 mg Oral Q12H   Followed by  . [START ON 06/26/2020] divalproex  250 mg Oral QHS  . epoetin (EPOGEN/PROCRIT) injection  10,000 Units Intravenous Q M,W,F-HD  . feeding  supplement  237 mL Oral BID BM  . heparin  5,000 Units Subcutaneous Q8H  . lidocaine  1 patch Transdermal Q24H  . melatonin  5 mg Oral QPM  . multivitamin  1 tablet Oral QHS  . polyethylene glycol  17 g Oral Daily  . senna-docusate  1 tablet Oral BID  . sevelamer carbonate  800 mg Oral TID WC  . sodium chloride flush  3 mL Intravenous Q12H  . tiZANidine  2 mg Oral QHS  . topiramate  25 mg Oral Daily  . topiramate  50 mg Oral QHS  . torsemide  10 mg Oral Daily  . traZODone  50 mg Oral QPM  . venlafaxine  37.5 mg Oral QHS  . vitamin B-12  100 mcg Oral Daily   Continuous Infusions: . sodium chloride    . albumin human 25 g (06/20/20 1143)       LOS: 53 days    Time spent: 25 minutes with > 50% spent in coordination of care and at bedside.    Ezekiel Slocumb, DO [facility]  06/23/2020, 6:55 PM      If 7PM-7AM, please contact night-coverage. How to contact the [facility] Attending or Consulting provider [place] or covering provider during after hours [place], for this patient?    1. Check the care team in [facility] and look for a) attending/consulting [facility] provider listed and b) the [facility] team listed 2. Log into [email or web address] and use [place]'s universal password to access. If you do not have the password, please contact the hospital operator. 3. Locate the [facility] provider you are looking for under [facility] and page to a number that you can be directly reached. 4. If you still have difficulty reaching the provider, please page the [facility] (Director on Call) for the Hospitalists listed on amion for assistance.

## 2020-06-23 NOTE — Progress Notes (Signed)
Physical Therapy Treatment Patient Details Name: Katie [name] MRN: [id number] DOB: [birth year] Today's Date: 06/23/2020    History of Present Illness Patient is a 65 y.o. female with a history of diabetes mellitus type 2, stage IV CKD, heart failure, hypertension. Patient presented secondary to dyspnea with evidence of volume overload and pleural effusion. Diagnosed with moderate pericardial effusion, mild to moderate aortic insufficiency and aortic stenosis.  S/p R heart cath 12/28.  R temp fem cath placed 12/29; 1/3 R IJ permcath placed; 1/4 R temp fem cath removed.    PT Comments    Pt was pleasant and motivated to participate during the session and put forth good effort throughout.  Pt requested to defer ambulation this date secondary to recently taking laxatives and concerned with incontinence of BM during ambulation.  Pt tolerated below therex including multiple sit to/from stands without adverse symptoms this session and demonstrated good control and stability.  Pt will benefit from PT services in a SNF setting upon discharge to safely address deficits listed in patient problem list for decreased caregiver assistance and eventual return to PLOF.     Follow Up Recommendations  Supervision for mobility/OOB;SNF     Equipment Recommendations  Wheelchair (measurements PT);Wheelchair cushion (measurements PT);Rolling walker with 5" wheels;3in1 (PT)    Recommendations for Other Services       Precautions / Restrictions Precautions Precautions: Fall Precaution Comments: R IJ permcath; monitor orthostatics Restrictions Other Position/Activity Restrictions: Pt Orthostatic, monitor BP with change of position    Mobility  Bed Mobility Overal bed mobility: Modified Independent             General bed mobility comments: Extra time and effort and use of rail    Transfers Overall transfer level: Needs assistance Equipment used: Rolling walker (2 wheeled) Transfers: Sit to/from  Stand Sit to Stand: Min guard;From elevated surface         General transfer comment: Pt able to perform sit to/from stand transfers with fair eccentric and concentric control and stability  Ambulation/Gait             General Gait Details: Pt declined amb this session secondary to pt having frequent BM's and concerned with having incontinent episode with walking   Stairs             Wheelchair Mobility    Modified Rankin (Stroke Patients Only)       Balance Overall balance assessment: Needs assistance Sitting-balance support: Feet supported Sitting balance-Leahy Scale: Good                                      Cognition Arousal/Alertness: Awake/alert Behavior During Therapy: WFL for tasks assessed/performed Overall Cognitive Status: Within Functional Limits for tasks assessed                                        Exercises Total Joint Exercises Ankle Circles/Pumps: AROM;Both;10 reps;Strengthening;15 reps (with manual resistance) Quad Sets: Strengthening;Both;10 reps Gluteal Sets: Strengthening;Both;10 reps Short Arc Quad: Strengthening;Both;20 reps (with manual resistance) Heel Slides: Strengthening;Both;10 reps (with manual resistance) Hip ABduction/ADduction: AROM;10 reps;Both;Strengthening (with manual resistance) Straight Leg Raises: AROM;Both;10 reps;Strengthening Long Arc Quad: AROM;Strengthening;Both;10 reps (with manual resistance) Knee Flexion: AROM;Strengthening;Both;10 reps (with manual resistance) Other Exercises Other Exercises: Sit to/from stand x 5 with emphasis on controlled eccentric phase  General Comments        Pertinent Vitals/Pain Pain Assessment: 0-10 Pain Score: 5  Pain Location: headache, chronic back pain Pain Descriptors / Indicators: Headache Pain Intervention(s): Premedicated before session;Monitored during session    Home Living                      Prior Function             PT Goals (current goals can now be found in the care plan section) Progress towards PT goals: Progressing toward goals    Frequency    Min 2X/week      PT Plan Current plan remains appropriate    Co-evaluation              AM-PAC PT "6 Clicks" Mobility   Outcome Measure  Help needed turning from your back to your side while in a flat bed without using bedrails?: None Help needed moving from lying on your back to sitting on the side of a flat bed without using bedrails?: A Little Help needed moving to and from a bed to a chair (including a wheelchair)?: A Little Help needed standing up from a chair using your arms (e.g., wheelchair or bedside chair)?: A Little Help needed to walk in hospital room?: A Little Help needed climbing 3-5 steps with a railing? : A Lot 6 Click Score: 18    End of Session Equipment Utilized During Treatment: Gait belt Activity Tolerance: Patient tolerated treatment well Patient left: in bed;with call bell/phone within reach;with bed alarm set Nurse Communication: Mobility status PT Visit Diagnosis: Unsteadiness on feet (R26.81);Muscle weakness (generalized) (M62.81);History of falling (Z91.81);Other abnormalities of gait and mobility (R26.89)     Time: [id number] PT Time Calculation (min) (ACUTE ONLY): 28 min  Charges:  $Therapeutic Exercise: 23-37 mins                     D. Scott Keith Cancio PT, DPT 06/23/20, 3:34 PM

## 2020-06-23 NOTE — Progress Notes (Addendum)
Neurology Progress Note  Patient ID: Shartavia Caines is a 65 y.o. with PMHx of  has a past medical history of 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[place]), Hypertension, Renal disorder, and Type 2 diabetes mellitus ([place]).  Initially consulted for: Headaches  Subjective: Headaches improving today.   Exam: Vitals:   06/23/20 0756 06/23/20 1153  BP: (!) 142/80 (!) 121/53  Pulse: 75 72  Resp: 14 15  Temp: 99 F (37.2 C) 98.6 F (37 C)  SpO2: 97% 97%   Gen: In bed, comfortable  Resp: non-labored breathing, no grossly audible wheezing Cardiac: Perfusing extremities well  Psych: Affect is brighter and she is generally more hopeful  Neuro: MS: Sleeping but awakens easily.   CN: EOMI, mild right eye ptosis that is stable.  She reports that the mild blurry vision she was having on lateral gaze is improving Motor: Moving all extremities freely antigravity, slightly less on the right  Pertinent Labs: AST 13, ALT 9  Impression: Continue to suspect underlying migraine headaches with component of medication overuse headache, exacerbated by demoralization, improving.  Suspect she may have some increased intracranial pressure secondary to severe hypertension given that her diplopia on lateral gaze is improving with improved blood pressures.  Given that her symptomology is improving, will hold off on additional imaging at this time  Recommendations:  #Preventative treatment -Continue venlafaxine             Initial: 37.5 mg once daily; titrate cautiously, not to exceed 50% of the maximum recommended dose (50% of 225 mg = 112.5 mg daily).  -Continue Topamax 25 mg in the morning, 50 mg in the evening.  Patient is tolerating this medication well -Outpatient neurology follow-up for consideration of nerve blocks, botox and/or CGRP inhibitor  # Bridge treatment to wean opiates -Continue Depakote 10 mg/kg completed on 2/8 (600 mg IV once), then 250 mg TID oral x 2 days, then 250 mg BID x 2 days,  then 250 mg nightly x 2 days (ordered)  -Will obtain Depakote level in the morning with dialysis labs to assess level and consider slowing down this taper based on patient's response and level -Tizanidine 4 mg QHS x 3 days (2/9-11), then PRN (robaxin discontinued)  #Abortive treatments -Continue rapid wean of opiates per primary team, today was reduced to every 12 hours from every 8 hours -Avoid immitrex until March 8th 2022, try 25 mg dose at a limit of 9 doses per month, but no more than 3 doses per week, but no more than 2 doses per day (may be repeated within 2 hours once on a given day) -Tylenol changed from 650 mg q6hr to 1000 mg q8hr, patient encouraged to try this medication given the increased dose (balanced by reduced frequency to avoid liver toxicity)  #Orthostasis -TED stockings off while in bed, on before activity. Consider abdominal binder before activity as well.   Neurology will continue to follow  Lesleigh Noe MD-PhD [facility] [phone]   30 minutes were spent in the care of this patient today, including discussion of her symptoms, examination and extended discussion of medication side effects and benefits as well as alternative migraine treatments including CBT and meditation

## 2020-06-23 NOTE — Progress Notes (Signed)
[place], [name] 06/23/20  Subjective:   LOS: Katie [name] states she is better today.  Continues to have a headache, but it has improved Denies shortness of breath and chest discomfort.  Able to eat meals with no nausea.    Objective:  Vital signs in last 24 hours:  Temp:  [98.4 F (36.9 C)-99 F (37.2 C)] 99 F (37.2 C) (02/10 0756) Pulse Rate:  [73-82] 75 (02/10 0756) Resp:  [14-18] 14 (02/10 0756) BP: (142-185)/(67-91) 142/80 (02/10 0756) SpO2:  [95 %-98 %] 97 % (02/10 0756)  Weight change:  Filed Weights   06/14/20 0700 06/19/20 0100 06/22/20 0655  Weight: 76.1 kg 73.9 kg 73.5 kg    Intake/Output:   No intake or output data in the 24 hours ending 06/23/20 0952   Physical Exam: General:  Resting in bed, NAD, smiling and social today   HEENT  oral mucous moist  Pulm/lungs Congestion, cleared with cough  CVS/Heart  S1-S2 present, no chest pain   Abdomen:  soft, nondistended, nontender  Extremities:  Trace peripheral edema  Neurologic:  Awake, alert, oriented  Skin: No acute rashes or lesions  Access: RIJ permcath        Basic Metabolic Panel:  Recent Labs  Lab 06/17/20 1220 06/21/20 0433  NA 138 137  K 3.7 4.3  CL 103 102  CO2 28 27  GLUCOSE 105* 103*  BUN 21 29*  CREATININE 1.33* 2.15*  CALCIUM 7.9* 8.5*  MG  --  2.3  PHOS 2.9  --      CBC: Recent Labs  Lab 06/17/20 1220 06/21/20 0433  WBC 5.9 5.7  HGB 8.8* 9.8*  HCT 27.7* 30.8*  MCV 96.5 99.0  PLT 262 258      Lab Results  Component Value Date   HEPBSAG NON REACTIVE 06/10/2020   HEPBSAB NON REACTIVE 05/16/2020   HEPBIGM NON REACTIVE 05/16/2020      Microbiology:  No results found for this or any previous visit (from the past 240 hour(s)).  Coagulation Studies: No results for input(s): LABPROT, INR in the last 72 hours.  Urinalysis: No results for input(s): COLORURINE, LABSPEC, PHURINE, GLUCOSEU, HGBUR, BILIRUBINUR, KETONESUR,  PROTEINUR, UROBILINOGEN, NITRITE, LEUKOCYTESUR in the last 72 hours.  Invalid input(s): APPERANCEUR    Imaging: No results found.   Medications:   . sodium chloride    . albumin human 25 g (06/20/20 1143)   . carvedilol  12.5 mg Oral BID WC  . Chlorhexidine Gluconate Cloth  6 each Topical Q0600  . cholecalciferol  1,000 Units Oral Daily  . cloNIDine  0.1 mg Transdermal Weekly  . divalproex  250 mg Oral Q8H   Followed by  . [START ON 06/24/2020] divalproex  250 mg Oral Q12H   Followed by  . [START ON 06/26/2020] divalproex  250 mg Oral QHS  . epoetin (EPOGEN/PROCRIT) injection  10,000 Units Intravenous Q M,W,F-HD  . feeding supplement  237 mL Oral BID BM  . heparin  5,000 Units Subcutaneous Q8H  . lidocaine  1 patch Transdermal Q24H  . melatonin  5 mg Oral QPM  . multivitamin  1 tablet Oral QHS  . polyethylene glycol  17 g Oral Daily  . senna-docusate  1 tablet Oral BID  . sevelamer carbonate  800 mg Oral TID WC  . sodium chloride flush  3 mL Intravenous Q12H  . tiZANidine  2 mg Oral QHS  . topiramate  25 mg Oral Daily  . topiramate  50 mg Oral QHS  . torsemide  10 mg Oral Daily  . traZODone  50 mg Oral QPM  . venlafaxine  37.5 mg Oral QHS  . vitamin B-12  100 mcg Oral Daily   sodium chloride, acetaminophen **OR** acetaminophen, ALPRAZolam, benzonatate, bisacodyl, bisacodyl, ondansetron **OR** ondansetron (ZOFRAN) IV, prochlorperazine, sodium chloride flush, tiZANidine **FOLLOWED BY** [START ON 06/24/2020] tiZANidine, traMADol  Assessment/ Plan:  65 y.o. female with  diastolic congestive heart failure, hypertension, diabetes mellitus type II, CVA was admitted on 04/30/2020 for  Principal Problem:   Volume overload Active Problems:   Type 2 diabetes mellitus with stage 5 chronic kidney disease (HCC)   Hypertension   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HCC)   Hyperkalemia   Pressure injury of skin   Pleural effusion   Acute heart failure with preserved  ejection fraction (HFpEF) (HCC)   Chronic kidney disease (CKD), stage IV (severe) ([place])   Pulmonary hypertension, unspecified (HCC)   ATN (acute tubular necrosis) (HCC)  Chronic kidney disease (CKD), stage IV (severe) ([place]) [N18.4] Volume overload [E87.70]   Patient moved from [place] in December 2021. She was following with nephrology there. Patient initiated on hemodialysis this admission. Patient has a prolonged hospitalization. She was initially admitted with volume overload and acute exacerbation of congestive heart failure. Failed IV diuretics and started on hemodialysis on 12/29. No outpatient dialysis due to patient's lack of insurance and not a resident of the state of [place]. Held dialysis for possible recovery however after several days, creatinine rising and volume accumulation. Patient then resumed on dialysis. She has been weaned down to twice a week  #. ESRD Labs are closely monitored. No need for urgent dialysis today. Plan to dialyze tomorrow.  #Hypertension with CKD Continue to monitor BP control on prescribed medications   #. Anemia of CKD Will continue to closely monitor HGB. Lab Results  Component Value Date   HGB 9.8 (L) 06/21/2020   EPO with HD to maintain current levels  #. Secondary hyperparathyroidism of renal origin N 25.81  Currently takes Sevelamer with meals    Component Value Date/Time   PTH 70 (H) 05/12/2020 0946   Lab Results  Component Value Date   PHOS 2.9 06/17/2020     LOS: [address] Colon Flattery, NP 2/10/20229:52 [place], [place]

## 2020-06-24 LAB — BASIC METABOLIC PANEL
Anion gap: 8 (ref 5–15)
BUN: 51 mg/dL — ABNORMAL HIGH (ref 8–23)
CO2: 24 mmol/L (ref 22–32)
Calcium: 8.2 mg/dL — ABNORMAL LOW (ref 8.9–10.3)
Chloride: 104 mmol/L (ref 98–111)
Creatinine, Ser: 3 mg/dL — ABNORMAL HIGH (ref 0.44–1.00)
GFR, Estimated: 17 mL/min — ABNORMAL LOW (ref >60)
Glucose, Bld: 112 mg/dL — ABNORMAL HIGH (ref 70–99)
Potassium: 4.5 mmol/L (ref 3.5–5.1)
Sodium: 136 mmol/L (ref 135–145)

## 2020-06-24 LAB — CBC
HCT: 27.1 % — ABNORMAL LOW (ref 36.0–46.0)
Hemoglobin: 9 g/dL — ABNORMAL LOW (ref 12.0–15.0)
MCH: 31.6 pg (ref 26.0–34.0)
MCHC: 33.2 g/dL (ref 30.0–36.0)
MCV: 95.1 fL (ref 80.0–100.0)
Platelets: 265 10*3/uL (ref 150–400)
RBC: 2.85 MIL/uL — ABNORMAL LOW (ref 3.87–5.11)
RDW: 13.7 % (ref 11.5–15.5)
WBC: 6 10*3/uL (ref 4.0–10.5)
nRBC: 0 % (ref 0.0–0.2)

## 2020-06-24 LAB — PHOSPHORUS: Phosphorus: 6.1 mg/dL — ABNORMAL HIGH (ref 2.5–4.6)

## 2020-06-24 LAB — MAGNESIUM: Magnesium: 2.3 mg/dL (ref 1.7–2.4)

## 2020-06-24 LAB — VALPROIC ACID LEVEL: Valproic Acid Lvl: 25 ug/mL — ABNORMAL LOW (ref 50.0–100.0)

## 2020-06-24 MED ORDER — VERAPAMIL HCL ER 120 MG PO TBCR
120.0000 mg | EXTENDED_RELEASE_TABLET | Freq: Every day | ORAL | Status: DC
  Administered 2020-06-24 – 2020-06-25 (×2): 120 mg via ORAL
  Filled 2020-06-24 (×3): qty 1

## 2020-06-24 MED ORDER — CARVEDILOL 6.25 MG PO TABS
6.2500 mg | ORAL_TABLET | Freq: Two times a day (BID) | ORAL | Status: DC
  Administered 2020-06-25 – 2020-06-26 (×4): 6.25 mg via ORAL
  Filled 2020-06-24 (×4): qty 1

## 2020-06-24 MED ORDER — DIVALPROEX SODIUM 500 MG PO DR TAB
500.0000 mg | DELAYED_RELEASE_TABLET | Freq: Two times a day (BID) | ORAL | Status: AC
  Administered 2020-06-26 – 2020-06-27 (×4): 500 mg via ORAL
  Filled 2020-06-24 (×4): qty 1

## 2020-06-24 MED ORDER — DIVALPROEX SODIUM 500 MG PO DR TAB
500.0000 mg | DELAYED_RELEASE_TABLET | Freq: Every day | ORAL | Status: AC
  Administered 2020-06-28 – 2020-06-30 (×3): 500 mg via ORAL
  Filled 2020-06-24 (×4): qty 1

## 2020-06-24 MED ORDER — DIVALPROEX SODIUM 500 MG PO DR TAB
500.0000 mg | DELAYED_RELEASE_TABLET | Freq: Three times a day (TID) | ORAL | Status: AC
  Administered 2020-06-24 – 2020-06-25 (×5): 500 mg via ORAL
  Filled 2020-06-24 (×6): qty 1

## 2020-06-24 NOTE — Progress Notes (Signed)
OT Cancellation Note  Patient Details Name: Katie [name] MRN: [id number] DOB: [birth year]   Cancelled Treatment:    Reason Eval/Treat Not Completed: Patient at procedure or test/ unavailable. Pt is off unit at dialysis. OT to follow up when pt is next available.   Darleen Crocker, MS, OTR/L , CBIS ascom [phone]  06/24/20, 11:44 AM   06/24/2020, 11:44 AM

## 2020-06-24 NOTE — Progress Notes (Signed)
PT Cancellation Note  Patient Details Name: Katie [name] MRN: [id number] DOB: 1955/11/08   Cancelled Treatment:     PT attempt. Max encouragement to participate however unwilling. Pt struggles to stay awake during brief conversation. Did just receive benadryl + compazine. BP checked 106/55 while in bed. Pt request return at later time/date.    Willette Pa 06/24/2020, 2:46 PM

## 2020-06-24 NOTE — Progress Notes (Signed)
Patient off 30 minutes early due to blood lines started clotting RN aware.

## 2020-06-24 NOTE — Progress Notes (Signed)
PROGRESS NOTE    Katie [name]   [id number]  DOB: [birth year]  PCP: Patient, [name]    DOA: 04/30/2020 LOS: 60   Brief Narrative   65 y.o. female with a history of diabetes mellitus type 2, stage IV CKD, heart failure, hypertension. Patient presented to the ED on 04/30/20 with complaints of worsening dyspnea with evidence of volume overload and pleural effusion.  Hospital stay has been prolonged due to disposition challenges and no safe discharge plan, due to lack of insurance difficulty with SNF placement in patient too profoundly weak to safely return home.    Acute medical issues have resolved and patient is medically stable for discharge once adequate plans are in place.  Senior case management involved and bed search has been expanded.  Ongoing issue with orthostatic hypotension, started on midodrine and TED hose.  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    Assessment & Plan   Principal Problem:   Volume overload Active Problems:   Type 2 diabetes mellitus with stage 5 chronic kidney disease (HCC)   Hypertension   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HCC)   Hyperkalemia   Pressure injury of skin   Pleural effusion   Acute heart failure with preserved ejection fraction (HFpEF) (HCC)   Chronic kidney disease (CKD), stage IV (severe) ([place])   Pulmonary hypertension, unspecified (HCC)   ATN (acute tubular necrosis) (HCC)   Acute on chronic diastolic CHF -echo in December 2021 showed normal EF and grade 2 diastolic dysfunction, associated RV systolic dysfunction and elevated PA pressure seen on TTE however right heart cath on 12/28 significantly for mildly elevated pulmonary artery pressures. Presented volume overloaded, initially managed with IV Lasix but had worsening renal function.  Unfortunately her CKD has progressed to ESRD and now on dialysis.   --Continue dialysis per nephrology --Continue torsemide 10 mg daily --Monitor  volume status closely: Daily weights   Pleural effusion, right side -likely due to volume overload in the setting of diastolic dysfunction and renal failure.  Status post right thoracentesis on 12/20.  No evidence of malignancy was seen on cytology.  Monitor.   Orthostatic hypotension -likely secondary to autonomic dysfunction.  Has baseline elevated blood pressures.  Was tried on midodrine which had to be stopped due to systolic pressures severely elevated 190s to 200s. --Continue TED hose --Continue working with PT as much as possible --Recheck orthostatics periodically --Continue midodrine on dialysis days   Headache, persistent / History of chronic migraine headaches / Medication Overuse Headaches -patient reports using Imitrex as needed.  Has been transitioned from oxycodone to tramadol. --Neurology consulted, recommendations: --Imaging pending: MRI brain, MRA, MRV --tramadol further reduced to 50 mg q12h PRN --d/c Imitrex (has overused) --continue prophylactic treatment with venlafaxine, Topamax  --continue Depakote taper per orders, Tizanidine to help wean off opiates --clonidine patch for more even BP control  --addition of verapimil-SR at bedtime  --tylenol 1g q8h PRN  --Outpatient neurology follow-up for nerve blocks, botox and CGRP inhibitor consideration    ESRD -unknown baseline renal function.  Patient unsure primary nephrologist name in [place]. Presented with creatinine 2.97.  Patient reported plan was to start dialysis and consideration of future renal transplant.  Renal function worsened over course of admission secondary to diuresis for acute CHF and volume overload.  She has been started on dialysis.   Anticipate outpatient dialysis will be [place]. --Nephrology following   Pericardial effusion, moderate -seen on echo,  no evidence of tamponade.  No recent infections to attribute this to.  In the setting of CKD 5 and elevated BUN, likely secondary to  uremia.  Patient has no chest pain.  Cardiology recommended outpatient echo for surveillance.   Essential hypertension - with labile BP's, orthostatic hypotension. PTA was on multiple agents including Coreg, Cozaar, torsemide. Had been tried on midodrine for orthostasis but stopped due to severely elevated pressures.   Losartan discontinued and started on clonidine by nephrology.   --Continue Coreg reduced 12.5>>6.25 mg BID while starting verapamil for HA's --Continue clonidine 0.1 mg patch every 7 days --Continue torsemide 10 mg daily   Anemia of chronic renal disease -with macrocytosis on CBC.  TIBC low but otherwise normal iron studies.  Vitamin B12 level 358.  Continue EPO with dialysis per renal.  Monitor CBC.   History of type 2 diabetes -last A1c 4.7%, not currently diabetic and CBGs well controlled.     History of tobacco abuse -quit at least 10 years ago, prior 20-pack-year history.   Insomnia -continue melatonin and trazodone.  No vitals overnight when patient sleeping, and minimize interruptions in sleep.   Pressure injury, mid coccyx -present on admission. Pressure Injury 04/30/20 Coccyx Mid Stage 2 -  Partial thickness loss of dermis presenting as a shallow open injury with a red, pink wound bed without slough. (Active)  04/30/20 2130  Location: Coccyx  Location Orientation: Mid  Staging: Stage 2 -  Partial thickness loss of dermis presenting as a shallow open injury with a red, pink wound bed without slough.  Wound Description (Comments):   Present on Admission: Yes     DVT prophylaxis: heparin injection 5,000 Units Start: 05/10/20 2200   Diet:  Diet Orders (From admission, onward)    Start     Ordered   05/31/20 1638  Diet 2 gram sodium Room service appropriate? Yes; Fluid consistency: Thin; Fluid restriction: 1200 mL Fluid  Diet effective now       Question Answer Comment  Room service appropriate? Yes   Fluid consistency: Thin   Fluid restriction: 1200 mL  Fluid      05/31/20 1638            Code Status: Full Code    Subjective 06/24/20    Patient seen after dialysis today.  Says she needs something for her headache is not possible.  Says headaches typically worse after dialysis.  Otherwise feeling well.   Disposition Plan & Communication   Status is: Inpatient  Remains inpatient appropriate because: unsafe discharge.  Pt requires SNF due to profound generalized weakness and inadequate assistance to safely go home. Bed search ongoing.   Dispo: The patient is from: Home              Anticipated d/c is to: SNF              Anticipated d/c date is: Pending SNF bed              Patient currently is medically stable to d/c.   Difficult to place patient Yes    Consults, Procedures, Significant Events   Consultants:   Nephrology  Neurology  Procedures:   Dialysis  Antimicrobials:  Anti-infectives (From admission, onward)   Start     Dose/Rate Route Frequency Ordered Stop   05/16/20 0905  clindamycin (CLEOCIN) 300 MG/50ML IVPB       Note to Pharmacy: Carlynn Spry   : cabinet override      05/16/20  [id number] 05/16/20 0920        Objective   Vitals:   06/24/20 1245 06/24/20 1300 06/24/20 1348 06/24/20 1551  BP: 119/69 139/70 (!) 179/82 131/60  Pulse: 69 71 75 78  Resp: '15 14 18 16  '$ Temp:   97.7 F (36.5 C) 98 F (36.7 C)  TempSrc:      SpO2:   97% 96%  Weight:      Height:        Intake/Output Summary (Last 24 hours) at 06/24/2020 2042 Last data filed at 06/24/2020 1300 Gross per 24 hour  Intake --  Output 1585 ml  Net -1585 ml   Filed Weights   06/19/20 0100 06/22/20 0655 06/24/20 0500  Weight: 73.9 kg 73.5 kg 73.2 kg    Physical Exam:  General exam: awake, alert, no acute distress Respiratory system: symmetric chest rise, normal respiratory effort, on room air. Cardiovascular system: RRR, no pedal edema.   Central nervous system: A&O x3. no gross focal neurologic deficits, normal speech Skin:  dry, intact, normal temperature    Labs   Data Reviewed: I have personally reviewed following labs and imaging studies  CBC: Recent Labs  Lab 06/21/20 0433 06/24/20 0500  WBC 5.7 6.0  HGB 9.8* 9.0*  HCT 30.8* 27.1*  MCV 99.0 95.1  PLT 258 [id number]   Basic Metabolic Panel: Recent Labs  Lab 06/21/20 0433 06/24/20 0500  NA 137 136  K 4.3 4.5  CL 102 104  CO2 27 24  GLUCOSE 103* 112*  BUN 29* 51*  CREATININE 2.15* 3.00*  CALCIUM 8.5* 8.2*  MG 2.3 2.3  PHOS  --  6.1*   GFR: Estimated Creatinine Clearance: 19.9 mL/min (A) (by C-G formula based on SCr of 3 mg/dL (H)). Liver Function Tests: Recent Labs  Lab 06/21/20 0433  AST 13*  ALT 9  ALKPHOS 50  BILITOT 0.8  PROT 4.9*  ALBUMIN 2.7*   No results for input(s): LIPASE, AMYLASE in the last 168 hours. No results for input(s): AMMONIA in the last 168 hours. Coagulation Profile: No results for input(s): INR, PROTIME in the last 168 hours. Cardiac Enzymes: No results for input(s): CKTOTAL, CKMB, CKMBINDEX, TROPONINI in the last 168 hours. BNP (last 3 results) No results for input(s): PROBNP in the last 8760 hours. HbA1C: No results for input(s): HGBA1C in the last 72 hours. CBG: Recent Labs  Lab 06/23/20 1153 06/23/20 1616 06/23/20 2014  GLUCAP 120* 129* 102*   Lipid Profile: No results for input(s): CHOL, HDL, LDLCALC, TRIG, CHOLHDL, LDLDIRECT in the last 72 hours. Thyroid Function Tests: No results for input(s): TSH, T4TOTAL, FREET4, T3FREE, THYROIDAB in the last 72 hours. Anemia Panel: No results for input(s): VITAMINB12, FOLATE, FERRITIN, TIBC, IRON, RETICCTPCT in the last 72 hours. Sepsis Labs: No results for input(s): PROCALCITON, LATICACIDVEN in the last 168 hours.  No results found for this or any previous visit (from the past 240 hour(s)).    Imaging Studies   No results found.   Medications   Scheduled Meds: . carvedilol  12.5 mg Oral BID WC  . Chlorhexidine Gluconate Cloth  6 each  Topical Q0600  . cholecalciferol  1,000 Units Oral Daily  . cloNIDine  0.1 mg Transdermal Weekly  . divalproex  500 mg Oral Q8H   Followed by  . [START ON 06/26/2020] divalproex  500 mg Oral Q12H  . [START ON 06/28/2020] divalproex  500 mg Oral QHS  . epoetin (EPOGEN/PROCRIT) injection  10,000 Units Intravenous Q M,W,F-HD  .  feeding supplement  237 mL Oral BID BM  . heparin  5,000 Units Subcutaneous Q8H  . lidocaine  1 patch Transdermal Q24H  . melatonin  5 mg Oral QPM  . multivitamin  1 tablet Oral QHS  . polyethylene glycol  17 g Oral Daily  . senna-docusate  1 tablet Oral BID  . sevelamer carbonate  800 mg Oral TID WC  . sodium chloride flush  3 mL Intravenous Q12H  . topiramate  25 mg Oral Daily  . topiramate  50 mg Oral QHS  . torsemide  10 mg Oral Daily  . traZODone  50 mg Oral QPM  . venlafaxine  37.5 mg Oral QHS  . vitamin B-12  100 mcg Oral Daily   Continuous Infusions: . sodium chloride    . albumin human 25 g (06/20/20 1143)       LOS: 54 days    Time spent: 25 minutes with > 50% spent in coordination of care and at bedside.    Ezekiel Slocumb, DO [facility]  06/24/2020, 8:42 PM      If 7PM-7AM, please contact night-coverage. How to contact the [facility] Attending or Consulting provider [place] or covering provider during after hours [place], for this patient?    1. Check the care team in [facility] and look for a) attending/consulting [facility] provider listed and b) the [facility] team listed 2. Log into [email or web address] and use [place]'s universal password to access. If you do not have the password, please contact the hospital operator. 3. Locate the [facility] provider you are looking for under [facility] and page to a number that you can be directly reached. 4. If you still have difficulty reaching the provider, please page the [facility] (Director on Call) for the Hospitalists listed on amion for assistance.

## 2020-06-24 NOTE — Progress Notes (Signed)
Neurology Progress Note  Patient ID: Katie [name] is a 65 y.o. with PMHx of  has a past medical history of 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[place]), Hypertension, Renal disorder, and Type 2 diabetes mellitus ([place]). Initially consulted for: Headache  Subjective: Headaches stable today.  Reports she did take some Tylenol and that was mildly helpful so she is willing to continue taking it in the future. No new neurologic complaints Regarding her vision she reports that she needs glasses and that is why it is blurry but she has not had glasses for a long time due to inability to get to an optometrist secondary to frequent hospitalizations.  Exam: Vitals:   06/24/20 0556 06/24/20 0804  BP: (!) 156/68 (!) 183/76  Pulse: 72 70  Resp: 16 16  Temp: 98.4 F (36.9 C) 98 F (36.7 C)  SpO2: 98% 94%   Gen: In bed, comfortable, preparing for dialysis Resp: non-labored breathing, no grossly audible wheezing Cardiac: Perfusing extremities well  Psych: Affect remains bright   Neuro: MS: Alert CN: EOMI, mild right eye ptosis that is stable.  She reports that the mild blurry vision she was having on lateral gaze is stable, slightly worse on the right than the left.  Funduscopic examination was attempted but dialysis room could not be sufficiently darkened for this evaluation and there was too much glare from ambient light Motor: Moving all extremities freely antigravity, slightly less on the right  Pertinent Labs: AST 13, ALT 9 Lab Results  Component Value Date   VALPROATE 25 (L) 06/24/2020   Note this is not a trough level as detailed in plan below  Impression: Continue to suspect underlying migraine headaches with component of medication overuse headache, exacerbated by demoralization, improving.  Suspect she may have some increased intracranial pressure secondary to severe hypertension given that her diplopia on lateral gaze is improving with improved blood pressures.  However, given  the difficulty in arranging outpatient care at this time, will additionally obtain MRI brain, MRA, and MRV, to rule out treatable secondary causes  Recommendations:  #Additional work-up -MRI brain, MRA, MRV pending today  #Preventative treatment -Continue venlafaxine             Initial: 37.5 mg once daily; titrate cautiously, not to exceed 50% of the maximum recommended dose (50% of 225 mg = 112.5 mg daily).  -Continue Topamax 25 mg in the morning, 50 mg in the evening.  Patient is tolerating this medication well -Outpatient neurology follow-up for consideration of nerve blocks, botox and/or CGRP inhibitor -Consider addition of verapamil as this medication is also very useful for headaches, particularly migraine headaches.  If this is initiated, would recommend 100 mg nightly of extended release formulation, with titration every 1 to 2 weeks based on patient response/tolerability up to 360 mg daily in 2 divided doses; defer to primary team given the lability of her blood pressure control  # Bridge treatment to wean opiates -s/p Depakote 10 mg/kg completed on 2/8 (600 mg IV once), then 250 mg TID oral x 2 days (2/9 - 2/10)  - given low level today (not trough, collected at 11 AM when given at 9 AM), will further increase to 500 mg TID x 2 days, then 500 mg BID x 2 days, then 500 mg nightly x 2 days -Tizanidine 4 mg QHS x 3 days (2/9-11), then PRN (robaxin discontinued)  #Abortive treatments -Continue rapid wean of opiates per primary team, today was reduced to every 12  hours from every 8 hours -Avoid immitrex until March 8th 2022, try 25 mg dose at a limit of 9 doses per month, but no more than 3 doses per week, but no more than 2 doses per day (may be repeated within 2 hours once on a given day) -Tylenol changed from 650 mg q6hr to 1000 mg q8hr, patient encouraged to try this medication given the increased dose (balanced by reduced frequency to avoid liver toxicity)  #Orthostasis -TED  stockings off while in bed, on before activity. Consider abdominal binder before activity as well.   Neurology will continue to follow  Lesleigh Noe MD-PhD [facility] [phone]   25 minutes spent in the care of this patient today, greater than 50% at bedside

## 2020-06-24 NOTE — Progress Notes (Signed)
200 NS given to prevent blood lines from clotting and added to UF goal RN aware.

## 2020-06-24 NOTE — Progress Notes (Signed)
[facility], [name] 06/24/20  Subjective:   LOS: 54  Patient seen sitting up in bed eating breakfast Endorses mild stomach upset Continues to have headache but was recently given Tylenol Denies breathing difficulty or chest pain    Objective:  Vital signs in last 24 hours:  Temp:  [98 F (36.7 C)-98.8 F (37.1 C)] 98.3 F (36.8 C) (02/11 1014) Pulse Rate:  [65-78] 68 (02/11 1145) Resp:  [13-18] 13 (02/11 1145) BP: (107-183)/(65-78) 118/73 (02/11 1145) SpO2:  [94 %-98 %] 94 % (02/11 0804) Weight:  [73.2 kg] 73.2 kg (02/11 0500)  Weight change:  Filed Weights   06/19/20 0100 06/22/20 0655 06/24/20 0500  Weight: 73.9 kg 73.5 kg 73.2 kg    Intake/Output:   No intake or output data in the 24 hours ending 06/24/20 1218   Physical Exam: General:  Resting in bed eating, NAD   HEENT  oral mucous moist  Pulm/lungs Clear bilaterally  CVS/Heart  S1-S2 present, no chest pain   Abdomen:  soft, nondistended, nontender  Extremities:  Trace peripheral edema  Neurologic:  Awake, alert, oriented  Skin: No acute rashes or lesions  Access: RIJ permcath        Basic Metabolic Panel:  Recent Labs  Lab 06/17/20 1220 06/21/20 0433 06/24/20 0500  NA 138 137 136  K 3.7 4.3 4.5  CL 103 102 104  CO2 '28 27 24  '$ GLUCOSE 105* 103* 112*  BUN 21 29* 51*  CREATININE 1.33* 2.15* 3.00*  CALCIUM 7.9* 8.5* 8.2*  MG  --  2.3 2.3  PHOS 2.9  --  6.1*     CBC: Recent Labs  Lab 06/17/20 1220 06/21/20 0433 06/24/20 0500  WBC 5.9 5.7 6.0  HGB 8.8* 9.8* 9.0*  HCT 27.7* 30.8* 27.1*  MCV 96.5 99.0 95.1  PLT 262 258 265      Lab Results  Component Value Date   HEPBSAG NON REACTIVE 06/10/2020   HEPBSAB NON REACTIVE 05/16/2020   HEPBIGM NON REACTIVE 05/16/2020      Microbiology:  No results found for this or any previous visit (from the past 240 hour(s)).  Coagulation Studies: No results for input(s): LABPROT, INR in the last 72  hours.  Urinalysis: No results for input(s): COLORURINE, LABSPEC, PHURINE, GLUCOSEU, HGBUR, BILIRUBINUR, KETONESUR, PROTEINUR, UROBILINOGEN, NITRITE, LEUKOCYTESUR in the last 72 hours.  Invalid input(s): APPERANCEUR    Imaging: No results found.   Medications:   . sodium chloride    . albumin human 25 g (06/20/20 1143)   . carvedilol  12.5 mg Oral BID WC  . Chlorhexidine Gluconate Cloth  6 each Topical Q0600  . cholecalciferol  1,000 Units Oral Daily  . cloNIDine  0.1 mg Transdermal Weekly  . divalproex  250 mg Oral Q12H   Followed by  . [START ON 06/26/2020] divalproex  250 mg Oral QHS  . epoetin (EPOGEN/PROCRIT) injection  10,000 Units Intravenous Q M,W,F-HD  . feeding supplement  237 mL Oral BID BM  . heparin  5,000 Units Subcutaneous Q8H  . lidocaine  1 patch Transdermal Q24H  . melatonin  5 mg Oral QPM  . multivitamin  1 tablet Oral QHS  . polyethylene glycol  17 g Oral Daily  . senna-docusate  1 tablet Oral BID  . sevelamer carbonate  800 mg Oral TID WC  . sodium chloride flush  3 mL Intravenous Q12H  . topiramate  25 mg Oral Daily  . topiramate  50 mg Oral QHS  .  torsemide  10 mg Oral Daily  . traZODone  50 mg Oral QPM  . venlafaxine  37.5 mg Oral QHS  . vitamin B-12  100 mcg Oral Daily   sodium chloride, acetaminophen **OR** acetaminophen, ALPRAZolam, benzonatate, bisacodyl, bisacodyl, diphenhydrAMINE, ondansetron **OR** ondansetron (ZOFRAN) IV, prochlorperazine, sodium chloride flush, [COMPLETED] tiZANidine **FOLLOWED BY** tiZANidine, traMADol  Assessment/ Plan:  65 y.o. female with  diastolic congestive heart failure, hypertension, diabetes mellitus type II, CVA was admitted on 04/30/2020 for  Principal Problem:   Volume overload Active Problems:   Type 2 diabetes mellitus with stage 5 chronic kidney disease (HCC)   Hypertension   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HCC)   Hyperkalemia   Pressure injury of skin   Pleural effusion   Acute heart  failure with preserved ejection fraction (HFpEF) (HCC)   Chronic kidney disease (CKD), stage IV (severe) ([place])   Pulmonary hypertension, unspecified (HCC)   ATN (acute tubular necrosis) (HCC)  Chronic kidney disease (CKD), stage IV (severe) ([place]) [N18.4] Volume overload [E87.70]   Patient moved from [place] in December 2021. She was following with nephrology there. Patient initiated on hemodialysis this admission. Patient has a prolonged hospitalization. She was initially admitted with volume overload and acute exacerbation of congestive heart failure. Failed IV diuretics and started on hemodialysis on 12/29. No outpatient dialysis due to patient's lack of insurance and not a resident of the state of [place]. Held dialysis for possible recovery however after several days, creatinine rising and volume accumulation. Patient then resumed on dialysis. She has been weaned down to twice a week  #. ESRD Scheduled for dialysis today Continue M,F schedule Tolerating this schedule and labs support this.  #Hypertension with CKD BP managed on current treatments. Will monitor during dialysis   #. Anemia of CKD Lab Results  Component Value Date   HGB 9.0 (L) 06/24/2020   EPO 10,000 units ordered during dialysis  #. Secondary hyperparathyroidism of renal origin N 25.81  Sevelamer with meals TID    Component Value Date/Time   PTH 70 (H) 05/12/2020 0946   Lab Results  Component Value Date   PHOS 6.1 (H) 06/24/2020     LOS: [place], NP 2/11/202212:18 [place], [place]

## 2020-06-24 NOTE — TOC Progression Note (Signed)
Transition of Care [facility]) - Progression Note    Patient Details  Name: Katie [name] MRN: [id number] Date of Birth: 1956-02-02  Transition of Care [facility]) CM/SW [place], LCSW Phone Number: 06/24/2020, 1:54 PM  Clinical Narrative:   Patient still needs LOG SNF placement. No bed offers at this time. TOC Leadership aware of barriers to DC.  Expected Discharge Plan: Skilled Nursing Facility Barriers to Discharge: Continued Medical Work up,Homeless with medical needs  Expected Discharge Plan and Services Expected Discharge Plan: [place] In-house Referral: Clinical Social Work Discharge Planning Services: Homebound not met per provider Post Acute Care Choice: [place] Living arrangements for the past 2 months: No permanent address (Lived with Sister and family)                 DME Arranged: N/A DME Agency: NA       HH Arranged: NA HH Agency: NA         Social Determinants of Health (SDOH) Interventions    Readmission Risk Interventions No flowsheet data found.

## 2020-06-24 NOTE — Progress Notes (Signed)
Blood lines reversed due to high AP RN aware.

## 2020-06-25 ENCOUNTER — Inpatient Hospital Stay: Payer: Medicaid Other

## 2020-06-25 MED ORDER — TOPIRAMATE 25 MG PO TABS
50.0000 mg | ORAL_TABLET | Freq: Two times a day (BID) | ORAL | Status: DC
  Administered 2020-06-25 – 2020-07-02 (×14): 50 mg via ORAL
  Filled 2020-06-25 (×15): qty 2

## 2020-06-25 MED ORDER — LORAZEPAM 2 MG/ML IJ SOLN
1.0000 mg | Freq: Once | INTRAMUSCULAR | Status: AC
  Administered 2020-06-25: 1 mg via INTRAVENOUS
  Filled 2020-06-25: qty 1

## 2020-06-25 MED ORDER — VENLAFAXINE HCL 37.5 MG PO TABS
37.5000 mg | ORAL_TABLET | Freq: Two times a day (BID) | ORAL | Status: DC
  Administered 2020-06-25 – 2020-07-02 (×13): 37.5 mg via ORAL
  Filled 2020-06-25 (×16): qty 1

## 2020-06-25 NOTE — Progress Notes (Signed)
PT Cancellation Note  Patient Details Name: Katie [name] MRN: [id number] DOB: 12-11-1955   Cancelled Treatment:     PT attempt. Upon entering room, pt was long sitting in bed with blankets over her head. She is awake and conversational. Reports she would like to have PT after lunch. Pt does have MRI ordered. Acute PT will continue to follow per POC, progressing as able per pt tolerance and per current POC.    Willette Pa 06/25/2020, 9:45 AM

## 2020-06-25 NOTE — Progress Notes (Signed)
PROGRESS NOTE    Katie [name]   [id number]  DOB: 27-Feb-1956  PCP: Patient, [name]    DOA: 04/30/2020 LOS: 66   Brief Narrative   65 y.o. female with a history of diabetes mellitus type 2, stage IV CKD, heart failure, hypertension. Patient presented to the ED on 04/30/20 with complaints of worsening dyspnea with evidence of volume overload and pleural effusion.  Hospital stay has been prolonged due to disposition challenges and no safe discharge plan, due to lack of insurance difficulty with SNF placement in patient too profoundly weak to safely return home.    Acute medical issues have resolved and patient is medically stable for discharge once adequate plans are in place.  Senior case management involved and bed search has been expanded.  Ongoing issues are orthostatic hypotension and headaches.  Neurology consulted and patient on multiple agents for prevention and abortive therapy.  Now off opiates completely so hopeful overuse headache component continues to improve.      Assessment & Plan   Principal Problem:   Volume overload Active Problems:   Type 2 diabetes mellitus with stage 5 chronic kidney disease (HCC)   Hypertension   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HCC)   Hyperkalemia   Pressure injury of skin   Pleural effusion   Acute heart failure with preserved ejection fraction (HFpEF) (HCC)   Chronic kidney disease (CKD), stage IV (severe) ([place])   Pulmonary hypertension, unspecified (HCC)   ATN (acute tubular necrosis) (HCC)   Acute on chronic diastolic CHF -echo in December 2021 showed normal EF and grade 2 diastolic dysfunction, associated RV systolic dysfunction and elevated PA pressure seen on TTE however right heart cath on 12/28 significantly for mildly elevated pulmonary artery pressures. Presented volume overloaded, initially managed with IV Lasix but had worsening renal function.  Unfortunately her CKD has progressed to ESRD and now on  dialysis.   --Continue dialysis per nephrology --Continue torsemide 10 mg daily --Monitor volume status closely: Daily weights   Pleural effusion, right side -likely due to volume overload in the setting of diastolic dysfunction and renal failure.  Status post right thoracentesis on 12/20.  No evidence of malignancy was seen on cytology.  Monitor.   Orthostatic hypotension -likely secondary to autonomic dysfunction.  Has baseline elevated blood pressures.  Was tried on midodrine which had to be stopped due to systolic pressures severely elevated 190s to 200s. --TED hose and abdominal binder when oob/mobility --Continue working with PT as much as possible --Recheck orthostatics periodically --Continue midodrine on dialysis days   Headache, persistent / History of chronic migraine headaches / Medication Overuse Headaches -patient reports using Imitrex as needed.  Has been transitioned from oxycodone to tramadol. --Neurology consulted, recommendations: --MRI brain, MRA, MRV were unremarkable --tramadol now d/c'd, off opioids --d/c Imitrex (has overused) --continue prophylactic treatment with venlafaxine, Topamax  --continue Depakote taper per orders, Tizanidine to help wean off opiates --clonidine patch for more even BP control  --addition of verapimil-SR at bedtime  --tylenol 1g q8h PRN  --Outpatient neurology follow-up for nerve blocks, botox and CGRP inhibitor consideration    ESRD -unknown baseline renal function.  Patient unsure primary nephrologist name in [place]. Presented with creatinine 2.97.  Patient reported plan was to start dialysis and consideration of future renal transplant.  Renal function worsened over course of admission secondary to diuresis for acute CHF and volume overload.  She has been started on dialysis.   Anticipate outpatient dialysis will  be [facility]. --Nephrology following   Pericardial effusion, moderate -seen on echo, no evidence of  tamponade.  No recent infections to attribute this to.  In the setting of CKD 5 and elevated BUN, likely secondary to uremia.  Patient has no chest pain.  Cardiology recommended outpatient echo for surveillance.   Essential hypertension - with labile BP's, orthostatic hypotension. PTA was on multiple agents including Coreg, Cozaar, torsemide. Had been tried on midodrine for orthostasis but stopped due to severely elevated pressures.   Losartan discontinued and started on clonidine by nephrology.   --Continue Coreg reduced 12.5>>6.25 mg BID while starting verapamil for HA's --Continue clonidine 0.1 mg patch every 7 days --Continue torsemide 10 mg daily   Anemia of chronic renal disease -with macrocytosis on CBC.  TIBC low but otherwise normal iron studies.  Vitamin B12 level 358.  Continue EPO with dialysis per renal.  Monitor CBC.   History of type 2 diabetes -last A1c 4.7%, not currently diabetic and CBGs well controlled.     History of tobacco abuse -quit at least 10 years ago, prior 20-pack-year history.   Insomnia -continue melatonin and trazodone.  No vitals overnight when patient sleeping, and minimize interruptions in sleep.   Pressure injury, mid coccyx -present on admission. Pressure Injury 04/30/20 Coccyx Mid Stage 2 -  Partial thickness loss of dermis presenting as a shallow open injury with a red, pink wound bed without slough. (Active)  04/30/20 2130  Location: Coccyx  Location Orientation: Mid  Staging: Stage 2 -  Partial thickness loss of dermis presenting as a shallow open injury with a red, pink wound bed without slough.  Wound Description (Comments):   Present on Admission: Yes     DVT prophylaxis: heparin injection 5,000 Units Start: 05/10/20 2200   Diet:  Diet Orders (From admission, onward)    Start     Ordered   05/31/20 1638  Diet 2 gram sodium Room service appropriate? Yes; Fluid consistency: Thin; Fluid restriction: 1200 mL Fluid  Diet effective now        Question Answer Comment  Room service appropriate? Yes   Fluid consistency: Thin   Fluid restriction: 1200 mL Fluid      05/31/20 1638            Code Status: Full Code    Subjective 06/25/20    Patient seen after MRI today, still drowsy from Ativan for claustrophobia.  She reports headaches today feel improved after verapamil was initiated last night.  She otherwise feels well overall and has no acute complaints.   Disposition Plan & Communication   Status is: Inpatient  Remains inpatient appropriate because: unsafe discharge.  Pt requires SNF due to profound generalized weakness and inadequate assistance to safely go home. Bed search ongoing.   Dispo: The patient is from: Home              Anticipated d/c is to: SNF              Anticipated d/c date is: Pending SNF bed              Patient currently is medically stable to d/c.   Difficult to place patient Yes    Consults, Procedures, Significant Events   Consultants:   Nephrology  Neurology  Procedures:   Dialysis  Antimicrobials:  Anti-infectives (From admission, onward)   Start     Dose/Rate Route Frequency Ordered Stop   05/16/20 0905  clindamycin (CLEOCIN) 300 MG/50ML IVPB  Note to Pharmacy: Carlynn Spry   : cabinet override      05/16/20 0905 05/16/20 0920        Objective   Vitals:   06/25/20 0500 06/25/20 0545 06/25/20 0802 06/25/20 1500  BP:  (!) 155/69 (!) 169/75 (!) 157/76  Pulse:  75 75 78  Resp:  '16 18 18  '$ Temp:  98.5 F (36.9 C) 98.2 F (36.8 C) 98.1 F (36.7 C)  TempSrc:  Oral Oral Oral  SpO2:  97% 95% 95%  Weight: 71.6 kg     Height:        Intake/Output Summary (Last 24 hours) at 06/25/2020 1915 Last data filed at 06/25/2020 0300 Gross per 24 hour  Intake 240 ml  Output --  Net 240 ml   Filed Weights   06/22/20 0655 06/24/20 0500 06/25/20 0500  Weight: 73.5 kg 73.2 kg 71.6 kg    Physical Exam:  General exam: Sleeping, woke to voice, no acute  distress Respiratory system: Clear anteriorly, normal respiratory effort, on room air. Cardiovascular system: RRR, no pedal edema.   Central nervous system: A&O x3. no gross focal neurologic deficits, normal speech Gastrointestinal: Soft nontender, positive bowel sounds    Labs   Data Reviewed: I have personally reviewed following labs and imaging studies  CBC: Recent Labs  Lab 06/21/20 0433 06/24/20 0500  WBC 5.7 6.0  HGB 9.8* 9.0*  HCT 30.8* 27.1*  MCV 99.0 95.1  PLT 258 [id number]   Basic Metabolic Panel: Recent Labs  Lab 06/21/20 0433 06/24/20 0500  NA 137 136  K 4.3 4.5  CL 102 104  CO2 27 24  GLUCOSE 103* 112*  BUN 29* 51*  CREATININE 2.15* 3.00*  CALCIUM 8.5* 8.2*  MG 2.3 2.3  PHOS  --  6.1*   GFR: Estimated Creatinine Clearance: 19.7 mL/min (A) (by C-G formula based on SCr of 3 mg/dL (H)). Liver Function Tests: Recent Labs  Lab 06/21/20 0433  AST 13*  ALT 9  ALKPHOS 50  BILITOT 0.8  PROT 4.9*  ALBUMIN 2.7*   No results for input(s): LIPASE, AMYLASE in the last 168 hours. No results for input(s): AMMONIA in the last 168 hours. Coagulation Profile: No results for input(s): INR, PROTIME in the last 168 hours. Cardiac Enzymes: No results for input(s): CKTOTAL, CKMB, CKMBINDEX, TROPONINI in the last 168 hours. BNP (last 3 results) No results for input(s): PROBNP in the last 8760 hours. HbA1C: No results for input(s): HGBA1C in the last 72 hours. CBG: Recent Labs  Lab 06/23/20 1153 06/23/20 1616 06/23/20 2014  GLUCAP 120* 129* 102*   Lipid Profile: No results for input(s): CHOL, HDL, LDLCALC, TRIG, CHOLHDL, LDLDIRECT in the last 72 hours. Thyroid Function Tests: No results for input(s): TSH, T4TOTAL, FREET4, T3FREE, THYROIDAB in the last 72 hours. Anemia Panel: No results for input(s): VITAMINB12, FOLATE, FERRITIN, TIBC, IRON, RETICCTPCT in the last 72 hours. Sepsis Labs: No results for input(s): PROCALCITON, LATICACIDVEN in the last 168  hours.  No results found for this or any previous visit (from the past 240 hour(s)).    Imaging Studies   MR ANGIO HEAD WO CONTRAST  Result Date: 06/25/2020 CLINICAL DATA:  65 year old female with history of diabetes with complications. Unexplained headache. EXAM: MRI HEAD WITHOUT CONTRAST MRA HEAD WITHOUT CONTRAST MRV HEAD WITHOUT CONTRAST TECHNIQUE: Multiplanar, multiecho pulse sequences of the brain and surrounding structures were obtained without intravenous contrast. Angiographic images of the head were obtained using MRA and MRV technique without  contrast. COMPARISON:  None. FINDINGS: MRI HEAD FINDINGS Brain: Cerebral volume appears within normal limits for age. No restricted diffusion to suggest acute infarction. No midline shift, mass effect, evidence of mass lesion, ventriculomegaly, extra-axial collection or acute intracranial hemorrhage. Cervicomedullary junction and pituitary are within normal limits. Pearline Cables and white matter signal is largely normal for age throughout the brain. No cortical encephalomalacia identified. There does appear to be a chronic microhemorrhage in the left caudate nucleus (series 13, image 26). Additional probable mild perivascular spaces in the deep gray matter. Vascular: Major intracranial vascular flow voids are preserved. Skull and upper cervical spine: Mild for age visible cervical spine degeneration. Visualized bone marrow signal is within normal limits. Sinuses/Orbits: Negative orbits.  Paranasal sinuses are clear. Other: Mastoids are clear. Visible internal auditory structures appear normal. Scalp and face appear negative. MRA HEAD FINDINGS Antegrade flow in the posterior circulation with fairly codominant distal vertebral arteries. No distal vertebral stenosis. Normal left PICA origin. Right AICA may be dominant. Both AICA origins are patent. Patent vertebrobasilar junction and basilar artery without stenosis. Patent SCA and PCA origins. Both posterior  communicating arteries are present. Bilateral PCA branches appear normal. Antegrade flow in both ICA siphons. No siphon stenosis. Ophthalmic and posterior communicating artery origins appear normal. Patent carotid termini. Normal MCA and ACA origins. Diminutive anterior communicating artery. MCA M1 segments and MCA bifurcations appear patent without stenosis. Visible bilateral MCA and ACA branches are within normal limits. MRV HEAD FINDINGS 2D time-of-flight MRV imaging demonstrates preserved flow signal in the superior sagittal sinus, torcula, straight sinus, vein of Galen, internal cerebral veins and basal veins of Rosenthal. Preserved flow signal in bilateral transverse and sigmoid sinuses and bilateral IJ bulbs, those on the left left appear slightly dominant. And preserved flow signal in the major cortical draining veins. IMPRESSION: 1. No acute intracranial abnormality and largely normal for age non-contrast MRI appearance of the brain; solitary chronic micro-hemorrhage in the left caudate nucleus. 2. Intracranial MRA and MRV are normal. Electronically Signed   By: Genevie Ann M.D.   On: 06/25/2020 11:08   MR BRAIN WO CONTRAST  Result Date: 06/25/2020 CLINICAL DATA:  65 year old female with history of diabetes with complications. Unexplained headache. EXAM: MRI HEAD WITHOUT CONTRAST MRA HEAD WITHOUT CONTRAST MRV HEAD WITHOUT CONTRAST TECHNIQUE: Multiplanar, multiecho pulse sequences of the brain and surrounding structures were obtained without intravenous contrast. Angiographic images of the head were obtained using MRA and MRV technique without contrast. COMPARISON:  None. FINDINGS: MRI HEAD FINDINGS Brain: Cerebral volume appears within normal limits for age. No restricted diffusion to suggest acute infarction. No midline shift, mass effect, evidence of mass lesion, ventriculomegaly, extra-axial collection or acute intracranial hemorrhage. Cervicomedullary junction and pituitary are within normal limits.  Pearline Cables and white matter signal is largely normal for age throughout the brain. No cortical encephalomalacia identified. There does appear to be a chronic microhemorrhage in the left caudate nucleus (series 13, image 26). Additional probable mild perivascular spaces in the deep gray matter. Vascular: Major intracranial vascular flow voids are preserved. Skull and upper cervical spine: Mild for age visible cervical spine degeneration. Visualized bone marrow signal is within normal limits. Sinuses/Orbits: Negative orbits.  Paranasal sinuses are clear. Other: Mastoids are clear. Visible internal auditory structures appear normal. Scalp and face appear negative. MRA HEAD FINDINGS Antegrade flow in the posterior circulation with fairly codominant distal vertebral arteries. No distal vertebral stenosis. Normal left PICA origin. Right AICA may be dominant. Both AICA origins are  patent. Patent vertebrobasilar junction and basilar artery without stenosis. Patent SCA and PCA origins. Both posterior communicating arteries are present. Bilateral PCA branches appear normal. Antegrade flow in both ICA siphons. No siphon stenosis. Ophthalmic and posterior communicating artery origins appear normal. Patent carotid termini. Normal MCA and ACA origins. Diminutive anterior communicating artery. MCA M1 segments and MCA bifurcations appear patent without stenosis. Visible bilateral MCA and ACA branches are within normal limits. MRV HEAD FINDINGS 2D time-of-flight MRV imaging demonstrates preserved flow signal in the superior sagittal sinus, torcula, straight sinus, vein of Galen, internal cerebral veins and basal veins of Rosenthal. Preserved flow signal in bilateral transverse and sigmoid sinuses and bilateral IJ bulbs, those on the left left appear slightly dominant. And preserved flow signal in the major cortical draining veins. IMPRESSION: 1. No acute intracranial abnormality and largely normal for age non-contrast MRI appearance of  the brain; solitary chronic micro-hemorrhage in the left caudate nucleus. 2. Intracranial MRA and MRV are normal. Electronically Signed   By: Genevie Ann M.D.   On: 06/25/2020 11:08   MR MRV HEAD WO CM  Result Date: 06/25/2020 CLINICAL DATA:  65 year old female with history of diabetes with complications. Unexplained headache. EXAM: MRI HEAD WITHOUT CONTRAST MRA HEAD WITHOUT CONTRAST MRV HEAD WITHOUT CONTRAST TECHNIQUE: Multiplanar, multiecho pulse sequences of the brain and surrounding structures were obtained without intravenous contrast. Angiographic images of the head were obtained using MRA and MRV technique without contrast. COMPARISON:  None. FINDINGS: MRI HEAD FINDINGS Brain: Cerebral volume appears within normal limits for age. No restricted diffusion to suggest acute infarction. No midline shift, mass effect, evidence of mass lesion, ventriculomegaly, extra-axial collection or acute intracranial hemorrhage. Cervicomedullary junction and pituitary are within normal limits. Pearline Cables and white matter signal is largely normal for age throughout the brain. No cortical encephalomalacia identified. There does appear to be a chronic microhemorrhage in the left caudate nucleus (series 13, image 26). Additional probable mild perivascular spaces in the deep gray matter. Vascular: Major intracranial vascular flow voids are preserved. Skull and upper cervical spine: Mild for age visible cervical spine degeneration. Visualized bone marrow signal is within normal limits. Sinuses/Orbits: Negative orbits.  Paranasal sinuses are clear. Other: Mastoids are clear. Visible internal auditory structures appear normal. Scalp and face appear negative. MRA HEAD FINDINGS Antegrade flow in the posterior circulation with fairly codominant distal vertebral arteries. No distal vertebral stenosis. Normal left PICA origin. Right AICA may be dominant. Both AICA origins are patent. Patent vertebrobasilar junction and basilar artery without  stenosis. Patent SCA and PCA origins. Both posterior communicating arteries are present. Bilateral PCA branches appear normal. Antegrade flow in both ICA siphons. No siphon stenosis. Ophthalmic and posterior communicating artery origins appear normal. Patent carotid termini. Normal MCA and ACA origins. Diminutive anterior communicating artery. MCA M1 segments and MCA bifurcations appear patent without stenosis. Visible bilateral MCA and ACA branches are within normal limits. MRV HEAD FINDINGS 2D time-of-flight MRV imaging demonstrates preserved flow signal in the superior sagittal sinus, torcula, straight sinus, vein of Galen, internal cerebral veins and basal veins of Rosenthal. Preserved flow signal in bilateral transverse and sigmoid sinuses and bilateral IJ bulbs, those on the left left appear slightly dominant. And preserved flow signal in the major cortical draining veins. IMPRESSION: 1. No acute intracranial abnormality and largely normal for age non-contrast MRI appearance of the brain; solitary chronic micro-hemorrhage in the left caudate nucleus. 2. Intracranial MRA and MRV are normal. Electronically Signed   By: Lemmie Evens  [place] Crane M.D.   On: 06/25/2020 11:08     Medications   Scheduled Meds: . carvedilol  6.25 mg Oral BID WC  . Chlorhexidine Gluconate Cloth  6 each Topical Q0600  . cholecalciferol  1,000 Units Oral Daily  . cloNIDine  0.1 mg Transdermal Weekly  . [START ON 06/26/2020] divalproex  500 mg Oral Q12H  . [START ON 06/28/2020] divalproex  500 mg Oral QHS  . epoetin (EPOGEN/PROCRIT) injection  10,000 Units Intravenous Q M,W,F-HD  . feeding supplement  237 mL Oral BID BM  . heparin  5,000 Units Subcutaneous Q8H  . lidocaine  1 patch Transdermal Q24H  . melatonin  5 mg Oral QPM  . multivitamin  1 tablet Oral QHS  . polyethylene glycol  17 g Oral Daily  . senna-docusate  1 tablet Oral BID  . sevelamer carbonate  800 mg Oral TID WC  . sodium chloride flush  3 mL Intravenous Q12H  .  topiramate  50 mg Oral BID  . torsemide  10 mg Oral Daily  . traZODone  50 mg Oral QPM  . venlafaxine  37.5 mg Oral BID WC  . verapamil  120 mg Oral QHS  . vitamin B-12  100 mcg Oral Daily   Continuous Infusions: . sodium chloride    . albumin human 25 g (06/20/20 1143)       LOS: 55 days    Time spent: 30 minutes with > 50% spent in coordination of care and at bedside.    Ezekiel Slocumb, DO [facility]  06/25/2020, 7:15 PM      If 7PM-7AM, please contact night-coverage. How to contact the [facility] Attending or Consulting provider [place] or covering provider during after hours [place], for this patient?    1. Check the care team in [facility] and look for a) attending/consulting [facility] provider listed and b) the [facility] team listed 2. Log into [email or web address] and use [facility]'s universal password to access. If you do not have the password, please contact the hospital operator. 3. Locate the [facility] provider you are looking for under [facility] and page to a number that you can be directly reached. 4. If you still have difficulty reaching the provider, please page the [facility] (Director on Call) for the Hospitalists listed on amion for assistance.

## 2020-06-25 NOTE — Plan of Care (Signed)
  Problem: Fluid Volume: Goal: Compliance with measures to maintain balanced fluid volume will improve Outcome: Progressing   Problem: Health Behavior/Discharge Planning: Goal: Ability to manage health-related needs will improve Outcome: Progressing   Problem: Clinical Measurements: Goal: Complications related to the disease process, condition or treatment will be avoided or minimized Outcome: Progressing

## 2020-06-25 NOTE — Progress Notes (Signed)
[facility]  ROUNDING NOTE   Subjective:   The patient reports having a headache. She reports she ate well today. Denies any issues with hemodialysis yesterday.   Objective:  Vital signs in last 24 hours:  Temp:  [97.7 F (36.5 C)-98.7 F (37.1 C)] 98.2 F (36.8 C) (02/12 0802) Pulse Rate:  [68-78] 75 (02/12 0802) Resp:  [13-18] 18 (02/12 0802) BP: (111-179)/(60-82) 169/75 (02/12 0802) SpO2:  [95 %-97 %] 95 % (02/12 0802) Weight:  [71.6 kg] 71.6 kg (02/12 0500)  Weight change: -1.6 kg Filed Weights   06/22/20 0655 06/24/20 0500 06/25/20 0500  Weight: 73.5 kg 73.2 kg 71.6 kg    Intake/Output: I/O last 3 completed shifts: In: 240 [P.O.:240] Out: [id number] [Other:1585]   Intake/Output this shift:  No intake/output data recorded.  Physical Exam: General: NAD,   Head: Normocephalic, atraumatic. Moist oral mucosal membranes  Eyes: Anicteric, PERRL  Neck: Supple, trachea midline  Lungs:  Clear to auscultation  Heart: Regular rate and rhythm, +murmur  Abdomen:  Soft, nontender,   Extremities:  No peripheral edema.  Neurologic: Nonfocal, moving all four extremities  Skin: No lesions  Access: RIJ permcath    Basic Metabolic Panel: Recent Labs  Lab 06/21/20 0433 06/24/20 0500  NA 137 136  K 4.3 4.5  CL 102 104  CO2 27 24  GLUCOSE 103* 112*  BUN 29* 51*  CREATININE 2.15* 3.00*  CALCIUM 8.5* 8.2*  MG 2.3 2.3  PHOS  --  6.1*    Liver Function Tests: Recent Labs  Lab 06/21/20 0433  AST 13*  ALT 9  ALKPHOS 50  BILITOT 0.8  PROT 4.9*  ALBUMIN 2.7*   No results for input(s): LIPASE, AMYLASE in the last 168 hours. No results for input(s): AMMONIA in the last 168 hours.  CBC: Recent Labs  Lab 06/21/20 0433 06/24/20 0500  WBC 5.7 6.0  HGB 9.8* 9.0*  HCT 30.8* 27.1*  MCV 99.0 95.1  PLT 258 265    Cardiac Enzymes: No results for input(s): CKTOTAL, CKMB, CKMBINDEX, TROPONINI in the last 168 hours.  BNP: Invalid input(s):  POCBNP  CBG: Recent Labs  Lab 06/23/20 1153 06/23/20 1616 06/23/20 2014  GLUCAP 120* 129* 102*    Microbiology: Results for orders placed or performed during the hospital encounter of 04/30/20  Resp Panel by RT-PCR (Flu A&B, Covid) Nasopharyngeal Swab     Status: None   Collection Time: 04/30/20  4:01 PM   Specimen: Nasopharyngeal Swab; Nasopharyngeal(NP) swabs in vial transport medium  Result Value Ref Range Status   SARS Coronavirus 2 by RT PCR NEGATIVE NEGATIVE Final    Comment: (NOTE) SARS-CoV-2 target nucleic acids are NOT DETECTED.  The SARS-CoV-2 RNA is generally detectable in upper respiratory specimens during the acute phase of infection. The lowest concentration of SARS-CoV-2 viral copies this assay can detect is 138 copies/mL. A negative result does not preclude SARS-Cov-2 infection and should not be used as the sole basis for treatment or other patient management decisions. A negative result may occur with  improper specimen collection/handling, submission of specimen other than nasopharyngeal swab, presence of viral mutation(s) within the areas targeted by this assay, and inadequate number of viral copies(<138 copies/mL). A negative result must be combined with clinical observations, patient history, and epidemiological information. The expected result is Negative.  Fact Sheet for Patients:  EntrepreneurPulse.com.au  Fact Sheet for Healthcare Providers:  IncredibleEmployment.be  This test is no t yet approved or cleared by the [place] FDA  and  has been authorized for detection and/or diagnosis of SARS-CoV-2 by FDA under an Emergency Use Authorization (EUA). This EUA will remain  in effect (meaning this test can be used) for the duration of the COVID-19 declaration under Section 564(b)(1) of the Act, 21 U.S.C.section 360bbb-3(b)(1), unless the authorization is terminated  or revoked sooner.       Influenza A by PCR  NEGATIVE NEGATIVE Final   Influenza B by PCR NEGATIVE NEGATIVE Final    Comment: (NOTE) The Xpert Xpress SARS-CoV-2/FLU/RSV plus assay is intended as an aid in the diagnosis of influenza from Nasopharyngeal swab specimens and should not be used as a sole basis for treatment. Nasal washings and aspirates are unacceptable for Xpert Xpress SARS-CoV-2/FLU/RSV testing.  Fact Sheet for Patients: EntrepreneurPulse.com.au  Fact Sheet for Healthcare Providers: IncredibleEmployment.be  This test is not yet approved or cleared by the [place] FDA and has been authorized for detection and/or diagnosis of SARS-CoV-2 by FDA under an Emergency Use Authorization (EUA). This EUA will remain in effect (meaning this test can be used) for the duration of the COVID-19 declaration under Section 564(b)(1) of the Act, 21 U.S.C. section 360bbb-3(b)(1), unless the authorization is terminated or revoked.  Performed at [facility], [address] [place], [place] [address]   Body fluid culture     Status: None   Collection Time: 05/02/20  4:44 PM   Specimen: PATH Cytology Pleural fluid  Result Value Ref Range Status   Specimen Description   Final    PLEURAL Performed at [facility], [address] [place], [place] [address]    Special Requests   Final    NONE Performed at [facility], [address] [place], [place] [address]    Gram Stain   Final    MODERATE WBC PRESENT, PREDOMINANTLY MONONUCLEAR NO ORGANISMS SEEN    Culture   Final    NO GROWTH Performed at [facility], [place] [address] [place], [place] [address]    Report Status 05/06/2020 FINAL  Final  MRSA PCR Screening     Status: None   Collection Time: 05/16/20  9:29 PM   Specimen: Nasal Mucosa; Nasopharyngeal  Result Value Ref Range Status   MRSA by PCR NEGATIVE NEGATIVE Final    Comment:        The GeneXpert MRSA Assay (FDA approved for NASAL  specimens only), is one component of a comprehensive MRSA colonization surveillance program. It is not intended to diagnose MRSA infection nor to guide or monitor treatment for MRSA infections. Performed at [facility], [address] [place],  [address]     Coagulation Studies: No results for input(s): LABPROT, INR in the last 72 hours.  Urinalysis: No results for input(s): COLORURINE, LABSPEC, PHURINE, GLUCOSEU, HGBUR, BILIRUBINUR, KETONESUR, PROTEINUR, UROBILINOGEN, NITRITE, LEUKOCYTESUR in the last 72 hours.  Invalid input(s): APPERANCEUR    Imaging: MR ANGIO HEAD WO CONTRAST  Result Date: 06/25/2020 CLINICAL DATA:  65 year old female with history of diabetes with complications. Unexplained headache. EXAM: MRI HEAD WITHOUT CONTRAST MRA HEAD WITHOUT CONTRAST MRV HEAD WITHOUT CONTRAST TECHNIQUE: Multiplanar, multiecho pulse sequences of the brain and surrounding structures were obtained without intravenous contrast. Angiographic images of the head were obtained using MRA and MRV technique without contrast. COMPARISON:  None. FINDINGS: MRI HEAD FINDINGS Brain: Cerebral volume appears within normal limits for age. No restricted diffusion to suggest acute infarction. No midline shift, mass effect, evidence of mass lesion, ventriculomegaly, extra-axial  collection or acute intracranial hemorrhage. Cervicomedullary junction and pituitary are within normal limits. Pear[place] Cables and white matter signal is largely normal for age throughout the brain. No cortical encephalomalacia identified. There does appear to be a chronic microhemorrhage in the left caudate nucleus (series 13, image 26). Additional probable mild perivascular spaces in the deep gray matter. Vascular: Major intracranial vascular flow voids are preserved. Skull and upper cervical spine: Mild for age visible cervical spine degeneration. Visualized bone marrow signal is within normal limits. Sinuses/Orbits: Negative orbits.   Paranasal sinuses are clear. Other: Mastoids are clear. Visible internal auditory structures appear normal. Scalp and face appear negative. MRA HEAD FINDINGS Antegrade flow in the posterior circulation with fairly codominant distal vertebral arteries. No distal vertebral stenosis. Normal left PICA origin. Right AICA may be dominant. Both AICA origins are patent. Patent vertebrobasilar junction and basilar artery without stenosis. Patent SCA and PCA origins. Both posterior communicating arteries are present. Bilateral PCA branches appear normal. Antegrade flow in both ICA siphons. No siphon stenosis. Ophthalmic and posterior communicating artery origins appear normal. Patent carotid termini. Normal MCA and ACA origins. Diminutive anterior communicating artery. MCA M1 segments and MCA bifurcations appear patent without stenosis. Visible bilateral MCA and ACA branches are within normal limits. MRV HEAD FINDINGS 2D time-of-flight MRV imaging demonstrates preserved flow signal in the superior sagittal sinus, torcula, straight sinus, vein of Galen, internal cerebral veins and basal veins of Rosenthal. Preserved flow signal in bilateral transverse and sigmoid sinuses and bilateral IJ bulbs, those on the left left appear slightly dominant. And preserved flow signal in the major cortical draining veins. IMPRESSION: 1. No acute intracranial abnormality and largely normal for age non-contrast MRI appearance of the brain; solitary chronic micro-hemorrhage in the left caudate nucleus. 2. Intracranial MRA and MRV are normal. Electronically Signed   By: Genevie Ann M.D.   On: 06/25/2020 11:08   MR BRAIN WO CONTRAST  Result Date: 06/25/2020 CLINICAL DATA:  65 year old female with history of diabetes with complications. Unexplained headache. EXAM: MRI HEAD WITHOUT CONTRAST MRA HEAD WITHOUT CONTRAST MRV HEAD WITHOUT CONTRAST TECHNIQUE: Multiplanar, multiecho pulse sequences of the brain and surrounding structures were obtained  without intravenous contrast. Angiographic images of the head were obtained using MRA and MRV technique without contrast. COMPARISON:  None. FINDINGS: MRI HEAD FINDINGS Brain: Cerebral volume appears within normal limits for age. No restricted diffusion to suggest acute infarction. No midline shift, mass effect, evidence of mass lesion, ventriculomegaly, extra-axial collection or acute intracranial hemorrhage. Cervicomedullary junction and pituitary are within normal limits. Pearline Cables and white matter signal is largely normal for age throughout the brain. No cortical encephalomalacia identified. There does appear to be a chronic microhemorrhage in the left caudate nucleus (series 13, image 26). Additional probable mild perivascular spaces in the deep gray matter. Vascular: Major intracranial vascular flow voids are preserved. Skull and upper cervical spine: Mild for age visible cervical spine degeneration. Visualized bone marrow signal is within normal limits. Sinuses/Orbits: Negative orbits.  Paranasal sinuses are clear. Other: Mastoids are clear. Visible internal auditory structures appear normal. Scalp and face appear negative. MRA HEAD FINDINGS Antegrade flow in the posterior circulation with fairly codominant distal vertebral arteries. No distal vertebral stenosis. Normal left PICA origin. Right AICA may be dominant. Both AICA origins are patent. Patent vertebrobasilar junction and basilar artery without stenosis. Patent SCA and PCA origins. Both posterior communicating arteries are present. Bilateral PCA branches appear normal. Antegrade flow in both ICA siphons. No siphon stenosis. Ophthalmic  and posterior communicating artery origins appear normal. Patent carotid termini. Normal MCA and ACA origins. Diminutive anterior communicating artery. MCA M1 segments and MCA bifurcations appear patent without stenosis. Visible bilateral MCA and ACA branches are within normal limits. MRV HEAD FINDINGS 2D time-of-flight MRV  imaging demonstrates preserved flow signal in the superior sagittal sinus, torcula, straight sinus, vein of Galen, internal cerebral veins and basal veins of Rosenthal. Preserved flow signal in bilateral transverse and sigmoid sinuses and bilateral IJ bulbs, those on the left left appear slightly dominant. And preserved flow signal in the major cortical draining veins. IMPRESSION: 1. No acute intracranial abnormality and largely normal for age non-contrast MRI appearance of the brain; solitary chronic micro-hemorrhage in the left caudate nucleus. 2. Intracranial MRA and MRV are normal. Electronically Signed   By: Genevie Ann M.D.   On: 06/25/2020 11:08   MR MRV HEAD WO CM  Result Date: 06/25/2020 CLINICAL DATA:  65 year old female with history of diabetes with complications. Unexplained headache. EXAM: MRI HEAD WITHOUT CONTRAST MRA HEAD WITHOUT CONTRAST MRV HEAD WITHOUT CONTRAST TECHNIQUE: Multiplanar, multiecho pulse sequences of the brain and surrounding structures were obtained without intravenous contrast. Angiographic images of the head were obtained using MRA and MRV technique without contrast. COMPARISON:  None. FINDINGS: MRI HEAD FINDINGS Brain: Cerebral volume appears within normal limits for age. No restricted diffusion to suggest acute infarction. No midline shift, mass effect, evidence of mass lesion, ventriculomegaly, extra-axial collection or acute intracranial hemorrhage. Cervicomedullary junction and pituitary are within normal limits. Pearline Cables and white matter signal is largely normal for age throughout the brain. No cortical encephalomalacia identified. There does appear to be a chronic microhemorrhage in the left caudate nucleus (series 13, image 26). Additional probable mild perivascular spaces in the deep gray matter. Vascular: Major intracranial vascular flow voids are preserved. Skull and upper cervical spine: Mild for age visible cervical spine degeneration. Visualized bone marrow signal is  within normal limits. Sinuses/Orbits: Negative orbits.  Paranasal sinuses are clear. Other: Mastoids are clear. Visible internal auditory structures appear normal. Scalp and face appear negative. MRA HEAD FINDINGS Antegrade flow in the posterior circulation with fairly codominant distal vertebral arteries. No distal vertebral stenosis. Normal left PICA origin. Right AICA may be dominant. Both AICA origins are patent. Patent vertebrobasilar junction and basilar artery without stenosis. Patent SCA and PCA origins. Both posterior communicating arteries are present. Bilateral PCA branches appear normal. Antegrade flow in both ICA siphons. No siphon stenosis. Ophthalmic and posterior communicating artery origins appear normal. Patent carotid termini. Normal MCA and ACA origins. Diminutive anterior communicating artery. MCA M1 segments and MCA bifurcations appear patent without stenosis. Visible bilateral MCA and ACA branches are within normal limits. MRV HEAD FINDINGS 2D time-of-flight MRV imaging demonstrates preserved flow signal in the superior sagittal sinus, torcula, straight sinus, vein of Galen, internal cerebral veins and basal veins of Rosenthal. Preserved flow signal in bilateral transverse and sigmoid sinuses and bilateral IJ bulbs, those on the left left appear slightly dominant. And preserved flow signal in the major cortical draining veins. IMPRESSION: 1. No acute intracranial abnormality and largely normal for age non-contrast MRI appearance of the brain; solitary chronic micro-hemorrhage in the left caudate nucleus. 2. Intracranial MRA and MRV are normal. Electronically Signed   By: Genevie Ann M.D.   On: 06/25/2020 11:08     Medications:   . sodium chloride    . albumin human 25 g (06/20/20 1143)   . carvedilol  6.25 mg Oral  BID WC  . Chlorhexidine Gluconate Cloth  6 each Topical Q0600  . cholecalciferol  1,000 Units Oral Daily  . cloNIDine  0.1 mg Transdermal Weekly  . divalproex  500 mg Oral  Q8H   Followed by  . [START ON 06/26/2020] divalproex  500 mg Oral Q12H  . [START ON 06/28/2020] divalproex  500 mg Oral QHS  . epoetin (EPOGEN/PROCRIT) injection  10,000 Units Intravenous Q M,W,F-HD  . feeding supplement  237 mL Oral BID BM  . heparin  5,000 Units Subcutaneous Q8H  . lidocaine  1 patch Transdermal Q24H  . melatonin  5 mg Oral QPM  . multivitamin  1 tablet Oral QHS  . polyethylene glycol  17 g Oral Daily  . senna-docusate  1 tablet Oral BID  . sevelamer carbonate  800 mg Oral TID WC  . sodium chloride flush  3 mL Intravenous Q12H  . topiramate  25 mg Oral Daily  . topiramate  50 mg Oral QHS  . torsemide  10 mg Oral Daily  . traZODone  50 mg Oral QPM  . venlafaxine  37.5 mg Oral QHS  . verapamil  120 mg Oral QHS  . vitamin B-12  100 mcg Oral Daily   sodium chloride, acetaminophen **OR** acetaminophen, ALPRAZolam, benzonatate, bisacodyl, bisacodyl, diphenhydrAMINE, ondansetron **OR** ondansetron (ZOFRAN) IV, prochlorperazine, sodium chloride flush, [COMPLETED] tiZANidine **FOLLOWED BY** tiZANidine, traMADol  Assessment/ Plan:  Katie [name] is a 65 y.o.  female with diastolic congestive heart failure, HTN, DM type II, CVA was admitted on 04/30/20 for volume overload.   Patient moved from [place] in December 2021. She was following with nephrology there. Patient initiated on hemodialysis this admission. Patient has a prolonged hospitalization. She was initially admitted with volume overload and acute exacerbation of congestive heart failure. Failed IV diuretics and started on hemodialysis on 12/29. No outpatient dialysis due to patient's lack of insurance and not a resident of the state of [place]. Held dialysis for possible recovery however after several days, creatinine rising and volume accumulation. Patient then resumed on dialysis. She has been weaned down to twice a week  1. ESRD on Monday Friday dialysis  - first dialysis was 05/11/20 - tolerated HD well  yesterday.   2. Hypertension with CKD  - BP controlled   3. Secondary Hyperparathyroidism of renal origin   - on renvela TID with meals   4. Anemia of CKD  - on EPO with dialysis    LOS: Agar 2/12/202211:49 AM

## 2020-06-25 NOTE — Progress Notes (Addendum)
Neurology Progress Note  Patient ID: Katie [name] is a 65 y.o. with PMHx of  has a past medical history of 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[place]), Hypertension, Renal disorder, and Type 2 diabetes mellitus ([place]).  Initially consulted for: Headaches  Subjective: Dialysis yesterday BP in the 107-139/67-78 range yesterday during dialysis, otherwise 140-180/70-80s Unable to work with PT/OT due to somnolence after receiving Benadryl and Compazine  Did not use additional opiates or tylenol Primary team reduced coreg to 6.25 BID, started verapamil 120 mg nightly  Exam: Vitals:   06/25/20 0545 06/25/20 0802  BP: (!) 155/69 (!) 169/75  Pulse: 75 75  Resp: 16 18  Temp: 98.5 F (36.9 C) 98.2 F (36.8 C)  SpO2: 97% 95%   Gen: In bed, comfortable  Resp: non-labored breathing, no grossly audible wheezing Cardiac: Perfusing extremities well  Psych: Affect is brighter and she is generally more hopeful  Neuro: MS: Sleeping but awakens easily.   CN: EOMI, mild right eye ptosis that is stable.  She reports that the mild blurry vision she was having on lateral gaze is improving Motor: Moving all extremities freely antigravity, slightly less on the right  Pertinent Labs: AST 13, ALT 9  Completed 2/12: MRI brain, MRA, MRV --no acute intracranial process.  No significant aneurysms.  No venous thrombosis. solitary chronicmicro-hemorrhage in the left caudate nucleus and some mild chronic microvascular disease  Impression: Continue to suspect underlying migraine headaches with component of medication overuse headache, exacerbated by demoralization, improving. Given she also reports prior diagnosis of cluster headache (and that symptoms used to improve with pacing / walking her dogs in the past), additionally suggested verapamil as this is standard for cluster headaches. Verapamil was started on 2/11 PM. Suspect she may have some increased intracranial pressure secondary to severe hypertension  given that her diplopia on lateral gaze is improving with improved blood pressures.  Additionally obtained MRI brain, MRA, MRV to rule out secondary causes of headache given continued severe pain, reassuringly these are negative. Low concern for idiopathic intracranial hypertension given symptomatology (not consistently worse at night, not clearly positional), as well as age.  I have aggressively started multiple preventative medications in an attempt to control this patient's severe headaches as detailed below.  In the next week or 2 with plan to discontinue clonidine patch, plan to discontinue opiates today if primary team agrees, continue to increase verapamil if tolerated.   Recommendations:  #Preventative treatment -Continue venlafaxine             Initial: 37.5 mg once daily (2/8 - 2/11), increased to BID starting 2/13   Titrate cautiously, not to exceed 50% of the maximum recommended dose (50% of 225 mg = 112.5 mg daily).  -Increase topamax to 50 mg BID (starting 2/13).  Patient is tolerating this medication well -verapamil 120 started (2/11 PM), to be adjusted by primary team  usual effective dose: 240 to 480 mg/day in 2 divided doses, increase as BP allows -coreg reduced from 12.5 mg BID to 6.25 BID (beta-blockers can also assist with migraine headache control) -long term, would plan to wean Topamax first, and continue venlafaxine for dual benefit of neuropathic pain control secondary to cervical disc disease and verapamil (indication for migraines, cluster HA and BP) and coreg (indication migraine control and BP control).  This will best be managed by an outpatient neurologist who is following the patient chronically -Outpatient neurology follow-up for consideration of nerve blocks, botox and/or CGRP inhibitor  #  Bridge treatment to wean opiates -s/p Depakote '10mg'$ /kg completed on 2/8 (600 mg IV once), then 250 mg TID oral x 2 days (2/9 - 2/10)             - given low level today (not  trough, collected at 11 AM when given at 9 AM), will further increase to 500 mg TID x 2 days, then 500 mg BID x 2 days, then 500 mg nightly x 2 days; would avoid long-term depakote given side effects -Tizanidine 4 mg QHS x 3 days (2/9-11), then PRN (robaxin discontinued) -clonidine patch in place. Long term would discontinue this (in 1-2 weeks, and increase verapamil)   #Abortive treatments -Continue rapid wean of opiates per primary team, currently 12 hours from every 8 hours. Would hold for at least 1 month after stopping, ideally 2 months.  -Avoid immitrex until March 8th 2022, try 25 mg dose at a limit of 9 doses per month, but no more than 3 doses per week, but no more than 2 doses per day (may be repeated within 2 hours once on a given day) -Tylenol changed from 650 mg q6hr to 1000 mg q8hr, patient encouraged to try this medication given the increased dose (balanced by reduced frequency to avoid liver toxicity)  #Orthostasis -TED stockings off while in bed, on before activity. Consider abdominal binder before activity as well.   Lesleigh Noe MD-PhD [facility] [phone]   40 minutes were spent in the care of this patient today, given need to review imaging, medication adjustments as above, discussion with patient and primary team.  Greater than 50% of this time was spent at the patient's bedside

## 2020-06-26 LAB — GLUCOSE, CAPILLARY: Glucose-Capillary: 84 mg/dL (ref 70–99)

## 2020-06-26 MED ORDER — VERAPAMIL HCL ER 180 MG PO TBCR
180.0000 mg | EXTENDED_RELEASE_TABLET | Freq: Every day | ORAL | Status: DC
  Administered 2020-06-26: 180 mg via ORAL
  Filled 2020-06-26 (×2): qty 1

## 2020-06-26 NOTE — Plan of Care (Signed)

## 2020-06-26 NOTE — Progress Notes (Signed)
Mobility Specialist - Progress Note   06/26/20 1400  Mobility  Activity Refused mobility  Mobility performed by Mobility specialist    Pt lying in bed with blankets over head. Pt refused mobility attempt this date, no reason specified. Mobility educated pt on the importance of staying active to prevent a decline in progression, pt showed understanding. Pt agreeable to attempt OOB activity later this date. Will attempt session if time permits.   Kathee Delton Mobility Specialist 06/26/20, 2:11 PM

## 2020-06-26 NOTE — Progress Notes (Signed)
PROGRESS NOTE    Katie [name]   [id number]  DOB: [birth year]  PCP: Patient, [name]    DOA: 04/30/2020 LOS: 36   Brief Narrative   65 y.o. female with a history of diabetes mellitus type 2, stage IV CKD, heart failure, hypertension. Patient presented to the ED on 04/30/20 with complaints of worsening dyspnea with evidence of volume overload and pleural effusion.  Hospital stay has been prolonged due to disposition challenges and no safe discharge plan, due to lack of insurance difficulty with SNF placement in patient too profoundly weak to safely return home.    Acute medical issues have resolved and patient is medically stable for discharge once adequate plans are in place.  Senior case management involved and bed search has been expanded.  Ongoing issues are orthostatic hypotension and headaches.  Neurology consulted and patient on multiple agents for prevention and abortive therapy.  Now off opiates completely so hopeful overuse headache component continues to improve.      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  Has been transitioned entirely off opioids.  MRI brain, MRA, MRV were unremarkable --Neurology consulted, recommendations: --d/c Imitrex (has overused) --continue prophylactics: venlafaxine, Topamax  --continue Depakote taper & tizanidine to help wean off opiates --clonidine patch for  more even BP control  --addition of verapamil-SR at bedtime -up-titrate as BP tolerates.  Increase 120>>180 mg this evening. --tylenol 1g q8h PRN  --Outpatient neurology follow-up for nerve blocks, botox and CGRP inhibitor consideration    Acute on chronic diastolic CHF -echo in December 2021 showed normal EF and grade 2 diastolic dysfunction, associated RV systolic dysfunction and elevated PA pressure seen on TTE however right heart cath on 12/28 significantly for mildly elevated pulmonary artery pressures. Presented volume overloaded, initially managed with IV Lasix but had worsening renal function.  Unfortunately her CKD has progressed to ESRD and now on dialysis.   --Continue dialysis per nephrology --Continue torsemide 10 mg daily --Monitor volume status closely: Daily weights   Pleural effusion, right side -likely due to volume overload in the setting of diastolic dysfunction and renal failure.  Status post right thoracentesis on 12/20.  No evidence of malignancy was seen on cytology.  Monitor.   Orthostatic hypotension -likely secondary to autonomic dysfunction.  Has baseline elevated blood pressures.  Was tried on midodrine which had to be stopped due to systolic pressures severely elevated 190s to 200s. --TED hose and abdominal binder when oob/mobility --Continue working with PT as much as possible --Recheck orthostatics periodically --Continue midodrine on dialysis days   ESRD -unknown baseline renal function.  Patient unsure primary nephrologist name in [place]. Presented with creatinine 2.97.  Patient reported plan was to start dialysis and consideration of future renal transplant.  Renal function worsened over course of admission secondary to diuresis for acute CHF and volume overload.  She has been started on dialysis.   Anticipate  outpatient dialysis will be [place]. --Nephrology following   Pericardial effusion, moderate -seen on echo, no evidence of  tamponade.  No recent infections to attribute this to.  In the setting of CKD 5 and elevated BUN, likely secondary to uremia.  Patient has no chest pain.  Cardiology recommended outpatient echo for surveillance.   Essential hypertension - with labile BP's, orthostatic hypotension. PTA was on multiple agents including Coreg, Cozaar, torsemide. Had been tried on midodrine for orthostasis but stopped due to severely elevated pressures.   Losartan discontinued and started on clonidine by nephrology.   --Continue Coreg reduced 12.5>>6.25 mg BID while starting verapamil for HA's --Continue clonidine 0.1 mg patch every 7 days --Continue torsemide 10 mg daily   Anemia of chronic renal disease -with macrocytosis on CBC.  TIBC low but otherwise normal iron studies.  Vitamin B12 level 358.  Continue EPO with dialysis per renal.  Monitor CBC.   History of type 2 diabetes -last A1c 4.7%, not currently diabetic and CBGs well controlled.     History of tobacco abuse -quit at least 10 years ago, prior 20-pack-year history.   Insomnia -continue melatonin and trazodone.  No vitals overnight when patient sleeping, and minimize interruptions in sleep.   Pressure injury, mid coccyx -present on admission. Pressure Injury 04/30/20 Coccyx Mid Stage 2 -  Partial thickness loss of dermis presenting as a shallow open injury with a red, pink wound bed without slough. (Active)  04/30/20 2130  Location: Coccyx  Location Orientation: Mid  Staging: Stage 2 -  Partial thickness loss of dermis presenting as a shallow open injury with a red, pink wound bed without slough.  Wound Description (Comments):   Present on Admission: Yes     DVT prophylaxis: heparin injection 5,000 Units Start: 05/10/20 2200   Diet:  Diet Orders (From admission, onward)    Start     Ordered   05/31/20 1638  Diet 2 gram sodium Room service appropriate? Yes; Fluid consistency: Thin; Fluid restriction: 1200 mL Fluid  Diet effective now        Question Answer Comment  Room service appropriate? Yes   Fluid consistency: Thin   Fluid restriction: 1200 mL Fluid      05/31/20 1638            Code Status: Full Code    Subjective 06/26/20    Patient reports improvement in headaches, fairly mild today.  Feels verapamil helpful.  We discussed trying to increase the dose if BP tolerates.  She still has intermittent nausea, but has been more mild.  No other acute complaints.   Disposition Plan & Communication   Status is: Inpatient  Remains inpatient appropriate because: unsafe discharge.  Pt requires SNF due to profound generalized weakness and inadequate assistance to safely go home. Bed search ongoing.   Dispo: The patient is from: Home              Anticipated d/c is to: SNF              Anticipated d/c date is: Pending SNF bed              Patient currently is medically stable to d/c.   Difficult to place patient Yes    Consults, Procedures, Significant Events   Consultants:   Nephrology  Neurology  Procedures:   Dialysis  Antimicrobials:  Anti-infectives (From admission, onward)   Start     Dose/Rate Route Frequency Ordered Stop   05/16/20  [id number]  clindamycin (CLEOCIN) 300 MG/50ML IVPB       Note to Pharmacy: Carlynn Spry   : cabinet override      05/16/20 0905 05/16/20 0920        Objective   Vitals:   06/26/20 0500 06/26/20 0906 06/26/20 1145 06/26/20 1618  BP:  (!) 187/71 (!) 180/84 (!) 173/83  Pulse:  70 70 74  Resp:  '16 20 16  '$ Temp:  98.5 F (36.9 C) 98.2 F (36.8 C) 98.2 F (36.8 C)  TempSrc:  Oral    SpO2:  97% 97% 98%  Weight: 72.4 kg     Height:       No intake or output data in the 24 hours ending 06/26/20 1958 Filed Weights   06/24/20 0500 06/25/20 0500 06/26/20 0500  Weight: 73.2 kg 71.6 kg 72.4 kg    Physical Exam:  General exam: resting in bed awake, no acute distress Respiratory system: symmetric chest rise, normal respiratory effort, on room  air. Cardiovascular system: RRR, no pedal edema.   Central nervous system: A&O x3. no gross focal neurologic deficits, normal speech Extremities: normal tone, no edema    Labs   Data Reviewed: I have personally reviewed following labs and imaging studies  CBC: Recent Labs  Lab 06/21/20 0433 06/24/20 0500  WBC 5.7 6.0  HGB 9.8* 9.0*  HCT 30.8* 27.1*  MCV 99.0 95.1  PLT 258 [id number]   Basic Metabolic Panel: Recent Labs  Lab 06/21/20 0433 06/24/20 0500  NA 137 136  K 4.3 4.5  CL 102 104  CO2 27 24  GLUCOSE 103* 112*  BUN 29* 51*  CREATININE 2.15* 3.00*  CALCIUM 8.5* 8.2*  MG 2.3 2.3  PHOS  --  6.1*   GFR: Estimated Creatinine Clearance: 19.8 mL/min (A) (by C-G formula based on SCr of 3 mg/dL (H)). Liver Function Tests: Recent Labs  Lab 06/21/20 0433  AST 13*  ALT 9  ALKPHOS 50  BILITOT 0.8  PROT 4.9*  ALBUMIN 2.7*   No results for input(s): LIPASE, AMYLASE in the last 168 hours. No results for input(s): AMMONIA in the last 168 hours. Coagulation Profile: No results for input(s): INR, PROTIME in the last 168 hours. Cardiac Enzymes: No results for input(s): CKTOTAL, CKMB, CKMBINDEX, TROPONINI in the last 168 hours. BNP (last 3 results) No results for input(s): PROBNP in the last 8760 hours. HbA1C: No results for input(s): HGBA1C in the last 72 hours. CBG: Recent Labs  Lab 06/23/20 1153 06/23/20 1616 06/23/20 2014 06/26/20 0908  GLUCAP 120* 129* 102* 84   Lipid Profile: No results for input(s): CHOL, HDL, LDLCALC, TRIG, CHOLHDL, LDLDIRECT in the last 72 hours. Thyroid Function Tests: No results for input(s): TSH, T4TOTAL, FREET4, T3FREE, THYROIDAB in the last 72 hours. Anemia Panel: No results for input(s): VITAMINB12, FOLATE, FERRITIN, TIBC, IRON, RETICCTPCT in the last 72 hours. Sepsis Labs: No results for input(s): PROCALCITON, LATICACIDVEN in the last 168 hours.  No results found for this or any previous visit (from the past 240 hour(s)).     Imaging Studies   MR ANGIO HEAD WO CONTRAST  Result Date: 06/25/2020 CLINICAL DATA:  65 year old female with history of diabetes with complications. Unexplained headache. EXAM: MRI HEAD WITHOUT CONTRAST MRA HEAD WITHOUT CONTRAST MRV HEAD WITHOUT CONTRAST TECHNIQUE: Multiplanar, multiecho pulse sequences of the brain and surrounding structures were obtained without intravenous contrast. Angiographic images of the head were obtained using MRA and MRV technique without contrast. COMPARISON:  None.  FINDINGS: MRI HEAD FINDINGS Brain: Cerebral volume appears within normal limits for age. No restricted diffusion to suggest acute infarction. No midline shift, mass effect, evidence of mass lesion, ventriculomegaly, extra-axial collection or acute intracranial hemorrhage. Cervicomedullary junction and pituitary are within normal limits. [place] Cables and white matter signal is largely normal for age throughout the brain. No cortical encephalomalacia identified. There does appear to be a chronic microhemorrhage in the left caudate nucleus (series 13, image 26). Additional probable mild perivascular spaces in the deep gray matter. Vascular: Major intracranial vascular flow voids are preserved. Skull and upper cervical spine: Mild for age visible cervical spine degeneration. Visualized bone marrow signal is within normal limits. Sinuses/Orbits: Negative orbits.  Paranasal sinuses are clear. Other: Mastoids are clear. Visible internal auditory structures appear normal. Scalp and face appear negative. MRA HEAD FINDINGS Antegrade flow in the posterior circulation with fairly codominant distal vertebral arteries. No distal vertebral stenosis. Normal left PICA origin. Right AICA may be dominant. Both AICA origins are patent. Patent vertebrobasilar junction and basilar artery without stenosis. Patent SCA and PCA origins. Both posterior communicating arteries are present. Bilateral PCA branches appear normal. Antegrade flow in both  ICA siphons. No siphon stenosis. Ophthalmic and posterior communicating artery origins appear normal. Patent carotid termini. Normal MCA and ACA origins. Diminutive anterior communicating artery. MCA M1 segments and MCA bifurcations appear patent without stenosis. Visible bilateral MCA and ACA branches are within normal limits. MRV HEAD FINDINGS 2D time-of-flight MRV imaging demonstrates preserved flow signal in the superior sagittal sinus, torcula, straight sinus, vein of Galen, internal cerebral veins and basal veins of Rosenthal. Preserved flow signal in bilateral transverse and sigmoid sinuses and bilateral IJ bulbs, those on the left left appear slightly dominant. And preserved flow signal in the major cortical draining veins. IMPRESSION: 1. No acute intracranial abnormality and largely normal for age non-contrast MRI appearance of the brain; solitary chronic micro-hemorrhage in the left caudate nucleus. 2. Intracranial MRA and MRV are normal. Electronically Signed   By: Genevie Ann M.D.   On: 06/25/2020 11:08   MR BRAIN WO CONTRAST  Result Date: 06/25/2020 CLINICAL DATA:  66 year old female with history of diabetes with complications. Unexplained headache. EXAM: MRI HEAD WITHOUT CONTRAST MRA HEAD WITHOUT CONTRAST MRV HEAD WITHOUT CONTRAST TECHNIQUE: Multiplanar, multiecho pulse sequences of the brain and surrounding structures were obtained without intravenous contrast. Angiographic images of the head were obtained using MRA and MRV technique without contrast. COMPARISON:  None. FINDINGS: MRI HEAD FINDINGS Brain: Cerebral volume appears within normal limits for age. No restricted diffusion to suggest acute infarction. No midline shift, mass effect, evidence of mass lesion, ventriculomegaly, extra-axial collection or acute intracranial hemorrhage. Cervicomedullary junction and pituitary are within normal limits. Pear[place] Cables and white matter signal is largely normal for age throughout the brain. No cortical  encephalomalacia identified. There does appear to be a chronic microhemorrhage in the left caudate nucleus (series 13, image 26). Additional probable mild perivascular spaces in the deep gray matter. Vascular: Major intracranial vascular flow voids are preserved. Skull and upper cervical spine: Mild for age visible cervical spine degeneration. Visualized bone marrow signal is within normal limits. Sinuses/Orbits: Negative orbits.  Paranasal sinuses are clear. Other: Mastoids are clear. Visible internal auditory structures appear normal. Scalp and face appear negative. MRA HEAD FINDINGS Antegrade flow in the posterior circulation with fairly codominant distal vertebral arteries. No distal vertebral stenosis. Normal left PICA origin. Right AICA may be dominant. Both AICA origins are patent. Patent vertebrobasilar junction  and basilar artery without stenosis. Patent SCA and PCA origins. Both posterior communicating arteries are present. Bilateral PCA branches appear normal. Antegrade flow in both ICA siphons. No siphon stenosis. Ophthalmic and posterior communicating artery origins appear normal. Patent carotid termini. Normal MCA and ACA origins. Diminutive anterior communicating artery. MCA M1 segments and MCA bifurcations appear patent without stenosis. Visible bilateral MCA and ACA branches are within normal limits. MRV HEAD FINDINGS 2D time-of-flight MRV imaging demonstrates preserved flow signal in the superior sagittal sinus, torcula, straight sinus, vein of Galen, internal cerebral veins and basal veins of Rosenthal. Preserved flow signal in bilateral transverse and sigmoid sinuses and bilateral IJ bulbs, those on the left left appear slightly dominant. And preserved flow signal in the major cortical draining veins. IMPRESSION: 1. No acute intracranial abnormality and largely normal for age non-contrast MRI appearance of the brain; solitary chronic micro-hemorrhage in the left caudate nucleus. 2. Intracranial  MRA and MRV are normal. Electronically Signed   By: Genevie Ann M.D.   On: 06/25/2020 11:08   MR MRV HEAD WO CM  Result Date: 06/25/2020 CLINICAL DATA:  65 year old female with history of diabetes with complications. Unexplained headache. EXAM: MRI HEAD WITHOUT CONTRAST MRA HEAD WITHOUT CONTRAST MRV HEAD WITHOUT CONTRAST TECHNIQUE: Multiplanar, multiecho pulse sequences of the brain and surrounding structures were obtained without intravenous contrast. Angiographic images of the head were obtained using MRA and MRV technique without contrast. COMPARISON:  None. FINDINGS: MRI HEAD FINDINGS Brain: Cerebral volume appears within normal limits for age. No restricted diffusion to suggest acute infarction. No midline shift, mass effect, evidence of mass lesion, ventriculomegaly, extra-axial collection or acute intracranial hemorrhage. Cervicomedullary junction and pituitary are within normal limits. Pear[place] Cables and white matter signal is largely normal for age throughout the brain. No cortical encephalomalacia identified. There does appear to be a chronic microhemorrhage in the left caudate nucleus (series 13, image 26). Additional probable mild perivascular spaces in the deep gray matter. Vascular: Major intracranial vascular flow voids are preserved. Skull and upper cervical spine: Mild for age visible cervical spine degeneration. Visualized bone marrow signal is within normal limits. Sinuses/Orbits: Negative orbits.  Paranasal sinuses are clear. Other: Mastoids are clear. Visible internal auditory structures appear normal. Scalp and face appear negative. MRA HEAD FINDINGS Antegrade flow in the posterior circulation with fairly codominant distal vertebral arteries. No distal vertebral stenosis. Normal left PICA origin. Right AICA may be dominant. Both AICA origins are patent. Patent vertebrobasilar junction and basilar artery without stenosis. Patent SCA and PCA origins. Both posterior communicating arteries are present.  Bilateral PCA branches appear normal. Antegrade flow in both ICA siphons. No siphon stenosis. Ophthalmic and posterior communicating artery origins appear normal. Patent carotid termini. Normal MCA and ACA origins. Diminutive anterior communicating artery. MCA M1 segments and MCA bifurcations appear patent without stenosis. Visible bilateral MCA and ACA branches are within normal limits. MRV HEAD FINDINGS 2D time-of-flight MRV imaging demonstrates preserved flow signal in the superior sagittal sinus, torcula, straight sinus, vein of Galen, internal cerebral veins and basal veins of Rosenthal. Preserved flow signal in bilateral transverse and sigmoid sinuses and bilateral IJ bulbs, those on the left left appear slightly dominant. And preserved flow signal in the major cortical draining veins. IMPRESSION: 1. No acute intracranial abnormality and largely normal for age non-contrast MRI appearance of the brain; solitary chronic micro-hemorrhage in the left caudate nucleus. 2. Intracranial MRA and MRV are normal. Electronically Signed   By: Herminio Heads.D.  On: 06/25/2020 11:08     Medications   Scheduled Meds: . carvedilol  6.25 mg Oral BID WC  . Chlorhexidine Gluconate Cloth  6 each Topical Q0600  . cholecalciferol  1,000 Units Oral Daily  . cloNIDine  0.1 mg Transdermal Weekly  . divalproex  500 mg Oral Q12H  . [START ON 06/28/2020] divalproex  500 mg Oral QHS  . epoetin (EPOGEN/PROCRIT) injection  10,000 Units Intravenous Q M,W,F-HD  . feeding supplement  237 mL Oral BID BM  . heparin  5,000 Units Subcutaneous Q8H  . lidocaine  1 patch Transdermal Q24H  . melatonin  5 mg Oral QPM  . multivitamin  1 tablet Oral QHS  . polyethylene glycol  17 g Oral Daily  . senna-docusate  1 tablet Oral BID  . sevelamer carbonate  800 mg Oral TID WC  . sodium chloride flush  3 mL Intravenous Q12H  . topiramate  50 mg Oral BID  . torsemide  10 mg Oral Daily  . traZODone  50 mg Oral QPM  . venlafaxine  37.5 mg  Oral BID WC  . verapamil  120 mg Oral QHS  . vitamin B-12  100 mcg Oral Daily   Continuous Infusions: . sodium chloride    . albumin human 25 g (06/20/20 1143)       LOS: 56 days    Time spent: 25 minutes with > 50% spent in coordination of care and at bedside.    Ezekiel Slocumb, DO [facility]  06/26/2020, 7:58 PM      If 7PM-7AM, please contact night-coverage. How to contact the [facility] Attending or Consulting provider [place] or covering provider during after hours [place], for this patient?    1. Check the care team in [facility] and look for a) attending/consulting [facility] provider listed and b) the [facility] team listed 2. Log into [email or web address] and use [place]'s universal password to access. If you do not have the password, please contact the hospital operator. 3. Locate the [facility] provider you are looking for under [facility] and page to a number that you can be directly reached. 4. If you still have difficulty reaching the provider, please page the [facility] (Director on Call) for the Hospitalists listed on amion for assistance.

## 2020-06-26 NOTE — Progress Notes (Signed)
[facility]  ROUNDING NOTE   Subjective:   The patient reports that she has a little bit of an appetite today, she is upright in bed eating breakfast.  She denies any shortness of breath, edema. Reports her headache had lessened earlier today but has since returned. She reports she has slight cough   Objective:  Vital signs in last 24 hours:  Temp:  [98 F (36.7 C)-98.8 F (37.1 C)] 98.5 F (36.9 C) (02/13 0906) Pulse Rate:  [63-78] 70 (02/13 0906) Resp:  [16-18] 16 (02/13 0906) BP: (132-187)/(66-78) 187/71 (02/13 0906) SpO2:  [95 %-97 %] 97 % (02/13 0906) Weight:  [72.4 kg] 72.4 kg (02/13 0500)  Weight change: 0.8 kg Filed Weights   06/24/20 0500 06/25/20 0500 06/26/20 0500  Weight: 73.2 kg 71.6 kg 72.4 kg    Intake/Output: I/O last 3 completed shifts: In: 240 [P.O.:240] Out: -    Intake/Output this shift:  No intake/output data recorded.  Physical Exam: General: NAD, sitting up in bed  Head: Normocephalic, atraumatic. Moist oral mucosal membranes  Eyes: Anicteric, PERRL  Neck: Supple, trachea midline  Lungs:  Clear to auscultation  Heart: Regular rate and rhythm  Abdomen:  Soft, nontender,   Extremities:  no peripheral edema.  Neurologic: Nonfocal, moving all four extremities  Skin: No lesions  Access: Right IJ Permcath     Basic Metabolic Panel: Recent Labs  Lab 06/21/20 0433 06/24/20 0500  NA 137 136  K 4.3 4.5  CL 102 104  CO2 27 24  GLUCOSE 103* 112*  BUN 29* 51*  CREATININE 2.15* 3.00*  CALCIUM 8.5* 8.2*  MG 2.3 2.3  PHOS  --  6.1*    Liver Function Tests: Recent Labs  Lab 06/21/20 0433  AST 13*  ALT 9  ALKPHOS 50  BILITOT 0.8  PROT 4.9*  ALBUMIN 2.7*   No results for input(s): LIPASE, AMYLASE in the last 168 hours. No results for input(s): AMMONIA in the last 168 hours.  CBC: Recent Labs  Lab 06/21/20 0433 06/24/20 0500  WBC 5.7 6.0  HGB 9.8* 9.0*  HCT 30.8* 27.1*  MCV 99.0 95.1  PLT 258 265    Cardiac  Enzymes: No results for input(s): CKTOTAL, CKMB, CKMBINDEX, TROPONINI in the last 168 hours.  BNP: Invalid input(s): POCBNP  CBG: Recent Labs  Lab 06/23/20 1153 06/23/20 1616 06/23/20 2014 06/26/20 0908  GLUCAP 120* 129* 102* 84    Microbiology: Results for orders placed or performed during the hospital encounter of 04/30/20  Resp Panel by RT-PCR (Flu A&B, Covid) Nasopharyngeal Swab     Status: None   Collection Time: 04/30/20  4:01 PM   Specimen: Nasopharyngeal Swab; Nasopharyngeal(NP) swabs in vial transport medium  Result Value Ref Range Status   SARS Coronavirus 2 by RT PCR NEGATIVE NEGATIVE Final    Comment: (NOTE) SARS-CoV-2 target nucleic acids are NOT DETECTED.  The SARS-CoV-2 RNA is generally detectable in upper respiratory specimens during the acute phase of infection. The lowest concentration of SARS-CoV-2 viral copies this assay can detect is 138 copies/mL. A negative result does not preclude SARS-Cov-2 infection and should not be used as the sole basis for treatment or other patient management decisions. A negative result may occur with  improper specimen collection/handling, submission of specimen other than nasopharyngeal swab, presence of viral mutation(s) within the areas targeted by this assay, and inadequate number of viral copies(<138 copies/mL). A negative result must be combined with clinical observations, patient history, and epidemiological information. The  expected result is Negative.  Fact Sheet for Patients:  EntrepreneurPulse.com.au  Fact Sheet for Healthcare Providers:  IncredibleEmployment.be  This test is no t yet approved or cleared by the [place] FDA and  has been authorized for detection and/or diagnosis of SARS-CoV-2 by FDA under an Emergency Use Authorization (EUA). This EUA will remain  in effect (meaning this test can be used) for the duration of the COVID-19 declaration under Section  564(b)(1) of the Act, 21 U.S.C.section 360bbb-3(b)(1), unless the authorization is terminated  or revoked sooner.       Influenza A by PCR NEGATIVE NEGATIVE Final   Influenza B by PCR NEGATIVE NEGATIVE Final    Comment: (NOTE) The Xpert Xpress SARS-CoV-2/FLU/RSV plus assay is intended as an aid in the diagnosis of influenza from Nasopharyngeal swab specimens and should not be used as a sole basis for treatment. Nasal washings and aspirates are unacceptable for Xpert Xpress SARS-CoV-2/FLU/RSV testing.  Fact Sheet for Patients: EntrepreneurPulse.com.au  Fact Sheet for Healthcare Providers: IncredibleEmployment.be  This test is not yet approved or cleared by the [place] FDA and has been authorized for detection and/or diagnosis of SARS-CoV-2 by FDA under an Emergency Use Authorization (EUA). This EUA will remain in effect (meaning this test can be used) for the duration of the COVID-19 declaration under Section 564(b)(1) of the Act, 21 U.S.C. section 360bbb-3(b)(1), unless the authorization is terminated or revoked.  Performed at [facility], [address] [place], [place] [address]   Body fluid culture     Status: None   Collection Time: 05/02/20  4:44 PM   Specimen: PATH Cytology Pleural fluid  Result Value Ref Range Status   Specimen Description   Final    PLEURAL Performed at [facility], [address], [place], [place] [address]    Special Requests   Final    NONE Performed at [facility], [address] [place], [place] [address]    Gram Stain   Final    MODERATE WBC PRESENT, PREDOMINANTLY MONONUCLEAR NO ORGANISMS SEEN    Culture   Final    NO GROWTH Performed at [facility], [place] [address], [place], [place] [address]    Report Status 05/06/2020 FINAL  Final  MRSA PCR Screening     Status: None   Collection Time: 05/16/20  9:29 PM   Specimen: Nasal Mucosa; Nasopharyngeal   Result Value Ref Range Status   MRSA by PCR NEGATIVE NEGATIVE Final    Comment:        The GeneXpert MRSA Assay (FDA approved for NASAL specimens only), is one component of a comprehensive MRSA colonization surveillance program. It is not intended to diagnose MRSA infection nor to guide or monitor treatment for MRSA infections. Performed at [facility], [address] [place], [place] [address]     Coagulation Studies: No results for input(s): LABPROT, INR in the last 72 hours.  Urinalysis: No results for input(s): COLORURINE, LABSPEC, PHURINE, GLUCOSEU, HGBUR, BILIRUBINUR, KETONESUR, PROTEINUR, UROBILINOGEN, NITRITE, LEUKOCYTESUR in the last 72 hours.  Invalid input(s): APPERANCEUR    Imaging: MR ANGIO HEAD WO CONTRAST  Result Date: 06/25/2020 CLINICAL DATA:  65 year old female with history of diabetes with complications. Unexplained headache. EXAM: MRI HEAD WITHOUT CONTRAST MRA HEAD WITHOUT CONTRAST MRV HEAD WITHOUT CONTRAST TECHNIQUE: Multiplanar, multiecho pulse sequences of the brain and surrounding structures were obtained without intravenous contrast. Angiographic images of the head were obtained using MRA and MRV technique without contrast.  COMPARISON:  None. FINDINGS: MRI HEAD FINDINGS Brain: Cerebral volume appears within normal limits for age. No restricted diffusion to suggest acute infarction. No midline shift, mass effect, evidence of mass lesion, ventriculomegaly, extra-axial collection or acute intracranial hemorrhage. Cervicomedullary junction and pituitary are within normal limits. [place] Cables and white matter signal is largely normal for age throughout the brain. No cortical encephalomalacia identified. There does appear to be a chronic microhemorrhage in the left caudate nucleus (series 13, image 26). Additional probable mild perivascular spaces in the deep gray matter. Vascular: Major intracranial vascular flow voids are preserved. Skull and upper cervical  spine: Mild for age visible cervical spine degeneration. Visualized bone marrow signal is within normal limits. Sinuses/Orbits: Negative orbits.  Paranasal sinuses are clear. Other: Mastoids are clear. Visible internal auditory structures appear normal. Scalp and face appear negative. MRA HEAD FINDINGS Antegrade flow in the posterior circulation with fairly codominant distal vertebral arteries. No distal vertebral stenosis. Normal left PICA origin. Right AICA may be dominant. Both AICA origins are patent. Patent vertebrobasilar junction and basilar artery without stenosis. Patent SCA and PCA origins. Both posterior communicating arteries are present. Bilateral PCA branches appear normal. Antegrade flow in both ICA siphons. No siphon stenosis. Ophthalmic and posterior communicating artery origins appear normal. Patent carotid termini. Normal MCA and ACA origins. Diminutive anterior communicating artery. MCA M1 segments and MCA bifurcations appear patent without stenosis. Visible bilateral MCA and ACA branches are within normal limits. MRV HEAD FINDINGS 2D time-of-flight MRV imaging demonstrates preserved flow signal in the superior sagittal sinus, torcula, straight sinus, vein of Galen, internal cerebral veins and basal veins of [place]. Preserved flow signal in bilateral transverse and sigmoid sinuses and bilateral IJ bulbs, those on the left left appear slightly dominant. And preserved flow signal in the major cortical draining veins. IMPRESSION: 1. No acute intracranial abnormality and largely normal for age non-contrast MRI appearance of the brain; solitary chronic micro-hemorrhage in the left caudate nucleus. 2. Intracranial MRA and MRV are normal. Electronically Signed   By: Genevie Ann M.D.   On: 06/25/2020 11:08   MR BRAIN WO CONTRAST  Result Date: 06/25/2020 CLINICAL DATA:  65 year old female with history of diabetes with complications. Unexplained headache. EXAM: MRI HEAD WITHOUT CONTRAST MRA HEAD WITHOUT  CONTRAST MRV HEAD WITHOUT CONTRAST TECHNIQUE: Multiplanar, multiecho pulse sequences of the brain and surrounding structures were obtained without intravenous contrast. Angiographic images of the head were obtained using MRA and MRV technique without contrast. COMPARISON:  None. FINDINGS: MRI HEAD FINDINGS Brain: Cerebral volume appears within normal limits for age. No restricted diffusion to suggest acute infarction. No midline shift, mass effect, evidence of mass lesion, ventriculomegaly, extra-axial collection or acute intracranial hemorrhage. Cervicomedullary junction and pituitary are within normal limits. Pear[place] Cables and white matter signal is largely normal for age throughout the brain. No cortical encephalomalacia identified. There does appear to be a chronic microhemorrhage in the left caudate nucleus (series 13, image 26). Additional probable mild perivascular spaces in the deep gray matter. Vascular: Major intracranial vascular flow voids are preserved. Skull and upper cervical spine: Mild for age visible cervical spine degeneration. Visualized bone marrow signal is within normal limits. Sinuses/Orbits: Negative orbits.  Paranasal sinuses are clear. Other: Mastoids are clear. Visible internal auditory structures appear normal. Scalp and face appear negative. MRA HEAD FINDINGS Antegrade flow in the posterior circulation with fairly codominant distal vertebral arteries. No distal vertebral stenosis. Normal left PICA origin. Right AICA may be dominant. Both AICA origins are patent.  Patent vertebrobasilar junction and basilar artery without stenosis. Patent SCA and PCA origins. Both posterior communicating arteries are present. Bilateral PCA branches appear normal. Antegrade flow in both ICA siphons. No siphon stenosis. Ophthalmic and posterior communicating artery origins appear normal. Patent carotid termini. Normal MCA and ACA origins. Diminutive anterior communicating artery. MCA M1 segments and MCA bifurcations  appear patent without stenosis. Visible bilateral MCA and ACA branches are within normal limits. MRV HEAD FINDINGS 2D time-of-flight MRV imaging demonstrates preserved flow signal in the superior sagittal sinus, torcula, straight sinus, vein of Galen, internal cerebral veins and basal veins of Rosenthal. Preserved flow signal in bilateral transverse and sigmoid sinuses and bilateral IJ bulbs, those on the left left appear slightly dominant. And preserved flow signal in the major cortical draining veins. IMPRESSION: 1. No acute intracranial abnormality and largely normal for age non-contrast MRI appearance of the brain; solitary chronic micro-hemorrhage in the left caudate nucleus. 2. Intracranial MRA and MRV are normal. Electronically Signed   By: Genevie Ann M.D.   On: 06/25/2020 11:08   MR MRV HEAD WO CM  Result Date: 06/25/2020 CLINICAL DATA:  65 year old female with history of diabetes with complications. Unexplained headache. EXAM: MRI HEAD WITHOUT CONTRAST MRA HEAD WITHOUT CONTRAST MRV HEAD WITHOUT CONTRAST TECHNIQUE: Multiplanar, multiecho pulse sequences of the brain and surrounding structures were obtained without intravenous contrast. Angiographic images of the head were obtained using MRA and MRV technique without contrast. COMPARISON:  None. FINDINGS: MRI HEAD FINDINGS Brain: Cerebral volume appears within normal limits for age. No restricted diffusion to suggest acute infarction. No midline shift, mass effect, evidence of mass lesion, ventriculomegaly, extra-axial collection or acute intracranial hemorrhage. Cervicomedullary junction and pituitary are within normal limits. Pearline Cables and white matter signal is largely normal for age throughout the brain. No cortical encephalomalacia identified. There does appear to be a chronic microhemorrhage in the left caudate nucleus (series 13, image 26). Additional probable mild perivascular spaces in the deep gray matter. Vascular: Major intracranial vascular flow  voids are preserved. Skull and upper cervical spine: Mild for age visible cervical spine degeneration. Visualized bone marrow signal is within normal limits. Sinuses/Orbits: Negative orbits.  Paranasal sinuses are clear. Other: Mastoids are clear. Visible internal auditory structures appear normal. Scalp and face appear negative. MRA HEAD FINDINGS Antegrade flow in the posterior circulation with fairly codominant distal vertebral arteries. No distal vertebral stenosis. Normal left PICA origin. Right AICA may be dominant. Both AICA origins are patent. Patent vertebrobasilar junction and basilar artery without stenosis. Patent SCA and PCA origins. Both posterior communicating arteries are present. Bilateral PCA branches appear normal. Antegrade flow in both ICA siphons. No siphon stenosis. Ophthalmic and posterior communicating artery origins appear normal. Patent carotid termini. Normal MCA and ACA origins. Diminutive anterior communicating artery. MCA M1 segments and MCA bifurcations appear patent without stenosis. Visible bilateral MCA and ACA branches are within normal limits. MRV HEAD FINDINGS 2D time-of-flight MRV imaging demonstrates preserved flow signal in the superior sagittal sinus, torcula, straight sinus, vein of Galen, internal cerebral veins and basal veins of Rosenthal. Preserved flow signal in bilateral transverse and sigmoid sinuses and bilateral IJ bulbs, those on the left left appear slightly dominant. And preserved flow signal in the major cortical draining veins. IMPRESSION: 1. No acute intracranial abnormality and largely normal for age non-contrast MRI appearance of the brain; solitary chronic micro-hemorrhage in the left caudate nucleus. 2. Intracranial MRA and MRV are normal. Electronically Signed   By: Genevie Ann  M.D.   On: 06/25/2020 11:08     Medications:   . sodium chloride    . albumin human 25 g (06/20/20 1143)   . carvedilol  6.25 mg Oral BID WC  . Chlorhexidine Gluconate Cloth   6 each Topical Q0600  . cholecalciferol  1,000 Units Oral Daily  . cloNIDine  0.1 mg Transdermal Weekly  . divalproex  500 mg Oral Q12H  . [START ON 06/28/2020] divalproex  500 mg Oral QHS  . epoetin (EPOGEN/PROCRIT) injection  10,000 Units Intravenous Q M,W,F-HD  . feeding supplement  237 mL Oral BID BM  . heparin  5,000 Units Subcutaneous Q8H  . lidocaine  1 patch Transdermal Q24H  . melatonin  5 mg Oral QPM  . multivitamin  1 tablet Oral QHS  . polyethylene glycol  17 g Oral Daily  . senna-docusate  1 tablet Oral BID  . sevelamer carbonate  800 mg Oral TID WC  . sodium chloride flush  3 mL Intravenous Q12H  . topiramate  50 mg Oral BID  . torsemide  10 mg Oral Daily  . traZODone  50 mg Oral QPM  . venlafaxine  37.5 mg Oral BID WC  . verapamil  120 mg Oral QHS  . vitamin B-12  100 mcg Oral Daily   sodium chloride, acetaminophen **OR** acetaminophen, ALPRAZolam, benzonatate, bisacodyl, bisacodyl, diphenhydrAMINE, ondansetron **OR** ondansetron (ZOFRAN) IV, prochlorperazine, sodium chloride flush, [COMPLETED] tiZANidine **FOLLOWED BY** tiZANidine  Assessment/ Plan:  [name] is a 65 y.o.  female female with diastolic congestive heart failure, HTN, DM type II, CVA was admitted on 04/30/20 for volume overload.   Patient moved from [place] in December 2021. She was following with nephrology there. Patient initiated on hemodialysis this admission. Patient has a prolonged hospitalization. She was initially admitted with volume overload and acute exacerbation of congestive heart failure. Failed IV diuretics and started on hemodialysis on 12/29. No outpatient dialysis due to patient's lack of insurance and not a resident of the state of [place]. Held dialysis for possible recovery however after several days, creatinine rising and volume accumulation. Patient then resumed on dialysis. She has been weaned down to twice a week  1. ESRD on Monday ,Friday dialysis  - first dialysis  was 05/11/20 - tolerated HD well friday.  - no indication for HD today. Plan for HD tomorrow.   2. Hypertension with CKD  - BP slightly elevated today.  - on carvedilol, clonidine patch, verapamil.   3. Secondary Hyperparathyroidism of renal origin   - on renvela '800mg'$  TID with meals   4. Anemia of CKD  - on EPO with dialysis    LOS: [place] 2/13/202211:44 AM

## 2020-06-26 NOTE — Progress Notes (Addendum)
Neurology Progress Note  Patient ID: Katie [name] is a 65 y.o. with PMHx of  has a past medical history of 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[place]), Hypertension, Renal disorder, and Type 2 diabetes mellitus ([place]).  Initially consulted for: Headaches  Subjective: Depakote last night held  Tolerating increased dose of venlafaxine  Now off of opiates   Exam: Vitals:   06/26/20 0412 06/26/20 0906  BP: (!) 146/69 (!) 187/71  Pulse: 63 70  Resp: 17 16  Temp: 98 F (36.7 C) 98.5 F (36.9 C)  SpO2: 96% 97%   Gen: In bed, comfortable  Resp: non-labored breathing, no grossly audible wheezing Cardiac: Perfusing extremities well  Psych: Affect is bright  Neuro: MS: Sleeping but awakens easily.   CN: EOMI, mild right eye ptosis intermittent;  Not seen today.   Motor: Moving all extremities freely antigravity  Pertinent Labs: AST 13, ALT 9 VPA level 2/11 -- 25  Completed 2/12: MRI brain, MRA, MRV --no acute intracranial process.  No significant aneurysms.  No venous thrombosis. solitary chronicmicro-hemorrhage in the left caudate nucleus and some mild chronic microvascular disease  Impression: Continue to suspect underlying migraine headaches with component of medication overuse headache, exacerbated by demoralization, improving. Given she also reports prior diagnosis of cluster headache (and that symptoms used to improve with pacing / walking her dogs in the past), additionally suggested verapamil as this is standard for cluster headaches while also being useful for migraine prophylaxsis. Verapamil was started on 2/11 PM.  Additionally obtained MRI brain, MRA, MRV to rule out secondary causes of headache given continued severe pain, reassuringly these are negative. Low concern for idiopathic intracranial hypertension given symptomatology (not consistently worse at night, not clearly positional), as well as age.  I have aggressively started multiple preventative medications in an  attempt to control this patient's severe headaches as detailed below.  Recommendations:  #Preventative treatment -Continue venlafaxine             Initial: 37.5 mg once daily (2/8 - 2/11), increased to BID starting 2/13   Titrate cautiously, not to exceed 50% of the maximum recommended dose (50% of 225 mg = 112.5 mg daily) due to dialysis  -Continue topamax 50 mg BID (last increased on 2/13).  Patient is tolerating this medication well -verapamil 120 started (2/11 PM), to be adjusted by primary team  usual effective dose: 240 to 480 mg/day in 2 divided doses, please increase as BP allows -coreg reduced from 12.5 mg BID to 6.25 BID (beta-blockers can also assist with migraine headache control) -long term, would plan to wean Topamax first, and continue venlafaxine for dual benefit of neuropathic pain control secondary to cervical disc disease and verapamil (indication for migraines, cluster HA and BP) and coreg (indication migraine control and BP control).  Regimen will best be managed by an outpatient neurologist who is following the patient chronically -Outpatient neurology follow-up for consideration of nerve blocks, botox and/or CGRP inhibitor  # Bridge treatment to wean opiates -s/p Depakote '10mg'$ /kg completed on 2/8 (600 mg IV once), then 250 mg TID oral x 2 days (2/9 - 2/10)             - given low level 2/11 (not trough, collected at 11 AM when given at 9 AM), will further increase to 500 mg TID x 2 days, then 500 mg BID x 2 days, then 500 mg nightly x 2 days; would avoid long-term depakote given side effects  - continue  taper as ordered  -Tizanidine 4 mg QHS x 3 days (2/9-11), then PRN (robaxin discontinued) -clonidine patch in place. Long term would discontinue this (in 1-2 weeks, and increase verapamil)   #Abortive treatments -Opiates stopped 2/12 -- Would hold for at least 1 month after stopping, ideally 2 months.  -Avoid immitrex until March 8th 2022, try 25 mg dose at a limit of  9 doses per month, but no more than 3 doses per week, but no more than 2 doses per day (may be repeated within 2 hours once on a given day) -Tylenol changed from 650 mg q6hr to 1000 mg q8hr, patient encouraged to try this medication given the increased dose (balanced by reduced frequency to avoid liver toxicity)  #Orthostasis -TED stockings off while in bed, on before activity. Consider abdominal binder before activity as well.   Neurology will sign off at this time, please re-consult if new questions or concerns arise.   Lesleigh Noe MD-PhD [facility] [phone]   30 minutes were spent in the care of this patient today reviewing care plan as above. Greater than 50% of this time was spent at the patient's bedside

## 2020-06-26 NOTE — Progress Notes (Signed)
PT Cancellation Note  Patient Details Name: Katie [name] MRN: [id number] DOB: 06-Dec-1955   Cancelled Treatment:    Reason Eval/Treat Not Completed: Other (comment). Pt in bed with blanket overhead, breakfast at bedside. Declining PT at this time due to wanting to rest a bit longer and then eat breakfast. PT to re-attempt as able.  Lieutenant Diego PT, DPT 9:44 AM,06/26/20

## 2020-06-26 NOTE — Plan of Care (Signed)
  Problem: Education: Goal: Knowledge of disease and its progression will improve Outcome: Progressing Goal: Individualized Educational Video(s) Outcome: Not Applicable   Problem: Fluid Volume: Goal: Compliance with measures to maintain balanced fluid volume will improve Outcome: Progressing   Problem: Health Behavior/Discharge Planning: Goal: Ability to manage health-related needs will improve Outcome: Progressing   Problem: Nutritional: Goal: Ability to make healthy dietary choices will improve Outcome: Progressing   Problem: Clinical Measurements: Goal: Complications related to the disease process, condition or treatment will be avoided or minimized Outcome: Progressing

## 2020-06-27 DIAGNOSIS — R519 Headache, unspecified: Secondary | ICD-10-CM | POA: Diagnosis present

## 2020-06-27 LAB — CBC
HCT: 31.8 % — ABNORMAL LOW (ref 36.0–46.0)
Hemoglobin: 10.6 g/dL — ABNORMAL LOW (ref 12.0–15.0)
MCH: 31.5 pg (ref 26.0–34.0)
MCHC: 33.3 g/dL (ref 30.0–36.0)
MCV: 94.4 fL (ref 80.0–100.0)
Platelets: 276 10*3/uL (ref 150–400)
RBC: 3.37 MIL/uL — ABNORMAL LOW (ref 3.87–5.11)
RDW: 13.3 % (ref 11.5–15.5)
WBC: 5.8 10*3/uL (ref 4.0–10.5)
nRBC: 0 % (ref 0.0–0.2)

## 2020-06-27 LAB — RENAL FUNCTION PANEL
Albumin: 2.7 g/dL — ABNORMAL LOW (ref 3.5–5.0)
Anion gap: 8 (ref 5–15)
BUN: 25 mg/dL — ABNORMAL HIGH (ref 8–23)
CO2: 26 mmol/L (ref 22–32)
Calcium: 8 mg/dL — ABNORMAL LOW (ref 8.9–10.3)
Chloride: 103 mmol/L (ref 98–111)
Creatinine, Ser: 1.39 mg/dL — ABNORMAL HIGH (ref 0.44–1.00)
GFR, Estimated: 43 mL/min — ABNORMAL LOW (ref >60)
Glucose, Bld: 94 mg/dL (ref 70–99)
Phosphorus: 2.7 mg/dL (ref 2.5–4.6)
Potassium: 2.9 mmol/L — ABNORMAL LOW (ref 3.5–5.1)
Sodium: 137 mmol/L (ref 135–145)

## 2020-06-27 MED ORDER — EPOETIN ALFA 10000 UNIT/ML IJ SOLN
4000.0000 [IU] | INTRAMUSCULAR | Status: DC
  Administered 2020-07-01: 4000 [IU] via INTRAVENOUS
  Filled 2020-06-27 (×2): qty 1

## 2020-06-27 MED ORDER — VERAPAMIL HCL ER 240 MG PO TBCR
240.0000 mg | EXTENDED_RELEASE_TABLET | Freq: Every day | ORAL | Status: DC
  Administered 2020-06-27 – 2020-07-01 (×5): 240 mg via ORAL
  Filled 2020-06-27 (×6): qty 1

## 2020-06-27 MED ORDER — CARVEDILOL 25 MG PO TABS
25.0000 mg | ORAL_TABLET | Freq: Two times a day (BID) | ORAL | Status: DC
  Administered 2020-06-27 – 2020-07-02 (×10): 25 mg via ORAL
  Filled 2020-06-27 (×10): qty 1

## 2020-06-27 MED ORDER — CARVEDILOL 6.25 MG PO TABS: 12.5000 mg | ORAL_TABLET | Freq: Two times a day (BID) | ORAL | Status: DC

## 2020-06-27 NOTE — Progress Notes (Signed)
Nutrition Follow-up  DOCUMENTATION CODES:   Not applicable  INTERVENTION:  Continue Ensure Enlive po BID, each supplement provides 350 kcal and 20 grams of protein. Patient prefers chocolate.  Continue Magic cup BID with lunch and dinner, each supplement provides 290 kcal and 9 grams of protein. Patient prefers orange.  Continue Rena-vit po QHS.  Encouraged ongoing adequate intake from meals, snacks, and oral nutrition supplements.  NUTRITION DIAGNOSIS:   Increased nutrient needs related to chronic illness (CKD IV with temp HD, CHF) as evidenced by estimated needs.  Ongoing.  GOAL:   Patient will meet greater than or equal to 90% of their needs  Met.  MONITOR:   PO intake,Supplement acceptance,Labs,Weight trends,Skin,I & O's  REASON FOR ASSESSMENT:   LOS    ASSESSMENT:   65 y/o female with h/o CKD IV, CHF, DM and [place] who is admitted with AKI and volume overload.  Met with patient at bedside after she returned from HD. She reports her appetite remains good. She was brought down to HD earlier than treatment was started so today she ended up missing breakfast and lunch. She is hungry and is looking forward to dinner. She had eaten some snacks at bedside. Yesterday patient ate all of her breakfast and dinner but was not hungry at lunch. She continues to drink Ensure Enlive po BID and is also enjoying the [employer] supplements. In the past 24 hours patient has had approximately 2141 kcal (100% estimated needs) and 92 grams of protein (97% minimum estimated needs).  Medications reviewed and include: vitamin D3 1000 units daily, Epogen 4000 units during HD, Rena-vit QHS, Miralax, senna-docusate 1 tablet BID, Renvela 800 mg TID, vitamin B12 100 micrograms daily, human albumin 25 grams during HD.  Labs reviewed: Potassium 2.9, BUN 25, Creatinine 1.39.  I/O: 5 occurrences unmeasured UOP yesterday  Weight trend: 72.2 kg on 2/14; -3.9 kg from 2/1 (pt -4663 mL from 1/31)  Diet  Order:   Diet Order            Diet 2 gram sodium Room service appropriate? Yes; Fluid consistency: Thin; Fluid restriction: 1200 mL Fluid  Diet effective now                EDUCATION NEEDS:   Education needs have been addressed  Skin:  Skin Assessment: Skin Integrity Issues: Skin Integrity Issues:: Stage II Stage II: coccyx  Last BM:  06/26/2020 - medium type 7  Height:   Ht Readings from Last 1 Encounters:  05/16/20 $RemoveB'5\' 6"'ubqrskIb$  (1.676 m)   Weight:   Wt Readings from Last 1 Encounters:  06/27/20 72.2 kg   Ideal Body Weight:  59 kg  BMI:  Body mass index is 25.69 kg/m.  Estimated Nutritional Needs:   Kcal:  1900-2200kcal/day  Protein:  95-110g/day  Fluid:  UOP +1L  Jacklynn Barnacle, MS, RD, LDN Pager number available on Amion

## 2020-06-27 NOTE — Progress Notes (Addendum)
OT Cancellation Note  Patient Details Name: Katie [name] MRN: [id number] DOB: 1956/01/07   Cancelled Treatment:    Reason Eval/Treat Not Completed: Patient at procedure or test/ unavailable   Attempted x2 this afternoon to see pt for OT tx. Pt unavailable/not in room at time of first attempt, politely declined due to headache/fatigue upon second attempt.  Will continue to follow up at next opportunity.  Myrtie Hawk Gayatri Teasdale, OTR/L 06/27/20, 2:32 PM

## 2020-06-27 NOTE — TOC Progression Note (Signed)
Transition of Care [facility]) - Progression Note    Patient Details  Name: Katie [name] MRN: [id number] Date of Birth: 11/19/1955  Transition of Care [facility]) CM/SW [place], LCSW Phone Number: 06/27/2020, 11:04 AM  Clinical Narrative:   Patient still has no bed offers for [facility] SNF placement.    Expected Discharge Plan: Skilled Nursing Facility Barriers to Discharge: Continued Medical Work up,Homeless with medical needs  Expected Discharge Plan and Services Expected Discharge Plan: [place] In-house Referral: Clinical Social Work Discharge Planning Services: Homebound not met per provider Post Acute Care Choice: [place] Living arrangements for the past 2 months: No permanent address (Lived with Sister and family)                 DME Arranged: N/A DME Agency: NA       HH Arranged: NA HH Agency: NA         Social Determinants of Health (SDOH) Interventions    Readmission Risk Interventions No flowsheet data found.

## 2020-06-27 NOTE — Progress Notes (Signed)
[facility]  ROUNDING NOTE   Subjective:   Katie [name] was seen during dialysis.    HEMODIALYSIS FLOWSHEET:  Blood Flow Rate (mL/min): 100 mL/min Arterial Pressure (mmHg): -80 mmHg Venous Pressure (mmHg): 70 mmHg Transmembrane Pressure (mmHg): 20 mmHg Ultrafiltration Rate (mL/min): 70 mL/min Dialysate Flow Rate (mL/min): 600 ml/min Conductivity: Machine : 14 Conductivity: Machine : 14 Dialysis Fluid Bolus: Normal Saline Bolus Amount (mL): 250 mL Dialysate Change: Other (comment)  Patient seen laying bed during dialysis treatment. No complaints of pain or discomfort.  Denies shortness of breath or chest pain. Reports a good appetite without nausea   Objective:  Vital signs in last 24 hours:  Temp:  [98.2 F (36.8 C)-98.6 F (37 C)] 98.4 F (36.9 C) (02/14 0859) Pulse Rate:  [68-79] 70 (02/14 0859) Resp:  [16-18] 16 (02/14 0859) BP: (141-177)/(70-83) 160/70 (02/14 0859) SpO2:  [96 %-98 %] 96 % (02/14 0859) Weight:  [72.2 kg] 72.2 kg (02/14 0500)  Weight change: -0.2 kg Filed Weights   06/25/20 0500 06/26/20 0500 06/27/20 0500  Weight: 71.6 kg 72.4 kg 72.2 kg    Intake/Output: No intake/output data recorded.   Intake/Output this shift:  No intake/output data recorded.  Physical Exam: General: NAD, resting in bed  Head: Normocephalic, atraumatic. Moist oral mucosal membranes  Eyes: Anicteric  Neck: Supple, trachea midline  Lungs:  Clear bilaterally  Heart: Regular rate and rhythm  Abdomen:  Soft, nontender,   Extremities:  no peripheral edema.  Neurologic: Nonfocal, moving all four extremities  Skin: No lesions  Access: Right IJ Permcath     Basic Metabolic Panel: Recent Labs  Lab 06/21/20 0433 06/24/20 0500 06/27/20 0909  NA 137 136 137  K 4.3 4.5 2.9*  CL 102 104 103  CO2 '27 24 26  '$ GLUCOSE 103* 112* 94  BUN 29* 51* 25*  CREATININE 2.15* 3.00* 1.39*  CALCIUM 8.5* 8.2* 8.0*  MG 2.3 2.3  --   PHOS  --  6.1* 2.7    Liver  Function Tests: Recent Labs  Lab 06/21/20 0433 06/27/20 0909  AST 13*  --   ALT 9  --   ALKPHOS 50  --   BILITOT 0.8  --   PROT 4.9*  --   ALBUMIN 2.7* 2.7*   No results for input(s): LIPASE, AMYLASE in the last 168 hours. No results for input(s): AMMONIA in the last 168 hours.  CBC: Recent Labs  Lab 06/21/20 0433 06/24/20 0500 06/27/20 0909  WBC 5.7 6.0 5.8  HGB 9.8* 9.0* 10.6*  HCT 30.8* 27.1* 31.8*  MCV 99.0 95.1 94.4  PLT 258 265 276    Cardiac Enzymes: No results for input(s): CKTOTAL, CKMB, CKMBINDEX, TROPONINI in the last 168 hours.  BNP: Invalid input(s): POCBNP  CBG: Recent Labs  Lab 06/23/20 1153 06/23/20 1616 06/23/20 2014 06/26/20 0908  GLUCAP 120* 129* 102* 84    Microbiology: Results for orders placed or performed during the hospital encounter of 04/30/20  Resp Panel by RT-PCR (Flu A&B, Covid) Nasopharyngeal Swab     Status: None   Collection Time: 04/30/20  4:01 PM   Specimen: Nasopharyngeal Swab; Nasopharyngeal(NP) swabs in vial transport medium  Result Value Ref Range Status   SARS Coronavirus 2 by RT PCR NEGATIVE NEGATIVE Final    Comment: (NOTE) SARS-CoV-2 target nucleic acids are NOT DETECTED.  The SARS-CoV-2 RNA is generally detectable in upper respiratory specimens during the acute phase of infection. The lowest concentration of SARS-CoV-2 viral copies this assay can  detect is 138 copies/mL. A negative result does not preclude SARS-Cov-2 infection and should not be used as the sole basis for treatment or other patient management decisions. A negative result may occur with  improper specimen collection/handling, submission of specimen other than nasopharyngeal swab, presence of viral mutation(s) within the areas targeted by this assay, and inadequate number of viral copies(<138 copies/mL). A negative result must be combined with clinical observations, patient history, and epidemiological information. The expected result is  Negative.  Fact Sheet for Patients:  EntrepreneurPulse.com.au  Fact Sheet for Healthcare Providers:  IncredibleEmployment.be  This test is no t yet approved or cleared by the [place] FDA and  has been authorized for detection and/or diagnosis of SARS-CoV-2 by FDA under an Emergency Use Authorization (EUA). This EUA will remain  in effect (meaning this test can be used) for the duration of the COVID-19 declaration under Section 564(b)(1) of the Act, 21 U.S.C.section 360bbb-3(b)(1), unless the authorization is terminated  or revoked sooner.       Influenza A by PCR NEGATIVE NEGATIVE Final   Influenza B by PCR NEGATIVE NEGATIVE Final    Comment: (NOTE) The Xpert Xpress SARS-CoV-2/FLU/RSV plus assay is intended as an aid in the diagnosis of influenza from Nasopharyngeal swab specimens and should not be used as a sole basis for treatment. Nasal washings and aspirates are unacceptable for Xpert Xpress SARS-CoV-2/FLU/RSV testing.  Fact Sheet for Patients: EntrepreneurPulse.com.au  Fact Sheet for Healthcare Providers: IncredibleEmployment.be  This test is not yet approved or cleared by the [place] FDA and has been authorized for detection and/or diagnosis of SARS-CoV-2 by FDA under an Emergency Use Authorization (EUA). This EUA will remain in effect (meaning this test can be used) for the duration of the COVID-19 declaration under Section 564(b)(1) of the Act, 21 U.S.C. section 360bbb-3(b)(1), unless the authorization is terminated or revoked.  Performed at [facility], [address] [place], [place] [address]   Body fluid culture     Status: None   Collection Time: 05/02/20  4:44 PM   Specimen: PATH Cytology Pleural fluid  Result Value Ref Range Status   Specimen Description   Final    PLEURAL Performed at [facility], [address], [place], [place] [address]     Special Requests   Final    NONE Performed at [facility], [address] [place], [place] [address]    Gram Stain   Final    MODERATE WBC PRESENT, PREDOMINANTLY MONONUCLEAR NO ORGANISMS SEEN    Culture   Final    NO GROWTH Performed at [facility], [place] [address], [place], [place] [address]    Report Status 05/06/2020 FINAL  Final  MRSA PCR Screening     Status: None   Collection Time: 05/16/20  9:29 PM   Specimen: Nasal Mucosa; Nasopharyngeal  Result Value Ref Range Status   MRSA by PCR NEGATIVE NEGATIVE Final    Comment:        The GeneXpert MRSA Assay (FDA approved for NASAL specimens only), is one component of a comprehensive MRSA colonization surveillance program. It is not intended to diagnose MRSA infection nor to guide or monitor treatment for MRSA infections. Performed at [facility] [address] [place], [place] [address]     Coagulation Studies: No results for input(s): LABPROT, INR in the last 72 hours.  Urinalysis: No results for input(s): COLORURINE, LABSPEC, PHURINE, GLUCOSEU, HGBUR, BILIRUBINUR, KETONESUR, PROTEINUR, UROBILINOGEN, NITRITE, LEUKOCYTESUR in the  last 72 hours.  Invalid input(s): APPERANCEUR    Imaging: No results found.   Medications:    sodium chloride     albumin human 25 g (06/20/20 1143)    carvedilol  12.5 mg Oral BID WC   Chlorhexidine Gluconate Cloth  6 each Topical Q0600   cholecalciferol  1,000 Units Oral Daily   cloNIDine  0.1 mg Transdermal Weekly   divalproex  500 mg Oral Q12H   [START ON 06/28/2020] divalproex  500 mg Oral QHS   epoetin (EPOGEN/PROCRIT) injection  10,000 Units Intravenous Q M,W,F-HD   feeding supplement  237 mL Oral BID BM   heparin  5,000 Units Subcutaneous Q8H   lidocaine  1 patch Transdermal Q24H   melatonin  5 mg Oral QPM   multivitamin  1 tablet Oral QHS   polyethylene glycol  17 g Oral Daily   senna-docusate  1 tablet Oral BID   sevelamer  carbonate  800 mg Oral TID WC   sodium chloride flush  3 mL Intravenous Q12H   topiramate  50 mg Oral BID   torsemide  10 mg Oral Daily   traZODone  50 mg Oral QPM   venlafaxine  37.5 mg Oral BID WC   verapamil  180 mg Oral QHS   vitamin B-12  100 mcg Oral Daily   sodium chloride, acetaminophen **OR** acetaminophen, ALPRAZolam, benzonatate, bisacodyl, bisacodyl, diphenhydrAMINE, ondansetron **OR** ondansetron (ZOFRAN) IV, prochlorperazine, sodium chloride flush, [COMPLETED] tiZANidine **FOLLOWED BY** tiZANidine  Assessment/ Plan:  [name] is a 65 y.o.  female female with diastolic congestive heart failure, HTN, DM type II, CVA was admitted on 04/30/20 for volume overload.   Patient moved from [place] in December 2021. She was following with nephrology there. Patient initiated on hemodialysis this admission. Patient has a prolonged hospitalization. She was initially admitted with volume overload and acute exacerbation of congestive heart failure. Failed IV diuretics and started on hemodialysis on 12/29. No outpatient dialysis due to patient's lack of insurance and not a resident of the state of [place]. Held dialysis for possible recovery however after several days, creatinine rising and volume accumulation. Patient then resumed on dialysis. She has been weaned down to twice a week  1. ESRD on Monday ,Friday dialysis  -Currently receiving dialysis today.  -tolerating it well to this point. -Agreeable to sitting in chair for next treatment  2. Hypertension with CKD  - BP continues to be elevated  -current medications offer improved control  3. Secondary Hyperparathyroidism of renal origin   - Currently prescribed Renvela TID  4. Anemia of CKD  -HGB currently 10.6. -Will decrease EPO to 4000 units with the next dialysis treatment -Will continue this for Monday and Friday treatments. -monitoring levels and adjusting as needed.    LOS: [place] 2/14/20221:27 PM

## 2020-06-27 NOTE — Progress Notes (Addendum)
PROGRESS NOTE    Katie [name]   [id number]  DOB: Jun 08, 1955  PCP: Patient, [name]    DOA: 04/30/2020 LOS: 57   Brief Narrative   65 y.o. female with a history of diabetes mellitus type 2, stage IV CKD, heart failure, hypertension. Patient presented to the ED on 04/30/20 with complaints of worsening dyspnea with evidence of volume overload and pleural effusion.  Hospital stay has been prolonged due to disposition challenges and no safe discharge plan, due to lack of insurance difficulty with SNF placement in patient too profoundly weak to safely return home.    Acute medical issues have resolved and patient is medically stable for discharge once adequate plans are in place.  Senior case management involved and bed search has been expanded.  Ongoing issues are orthostatic hypotension and headaches.  Neurology consulted and patient on multiple agents for prevention and abortive therapy.  Now off opiates completely so hopeful overuse headache component continues to improve.      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  Has been transitioned entirely off opioids.  MRI brain, MRA, MRV were unremarkable --Neurology consulted, recommendations: --d/c Imitrex (has overused) --continue prophylactics: venlafaxine, Topamax  --continue Depakote taper & tizanidine to help wean off opiates --clonidine patch for  more even BP control  --addition of verapamil-SR qHS, up-titrate as BP tolerates, increase again 180>>240 mgmg this evening. --tylenol 1g q8h PRN  --Outpatient neurology follow-up for nerve blocks, botox and CGRP inhibitor consideration    Acute on chronic diastolic CHF -echo in December 2021 showed normal EF and grade 2 diastolic dysfunction, associated RV systolic dysfunction and elevated PA pressure seen on TTE however right heart cath on 12/28 significantly for mildly elevated pulmonary artery pressures. Presented volume overloaded, initially managed with IV Lasix but had worsening renal function.  Unfortunately her CKD has progressed to ESRD and now on dialysis.   --Continue dialysis per nephrology --Continue torsemide 10 mg daily --Monitor volume status closely: Daily weights.   Pleural effusion, right side -likely due to volume overload in the setting of diastolic dysfunction and renal failure.  Status post right thoracentesis on 12/20.  No evidence of malignancy was seen on cytology.  Monitor.   Orthostatic hypotension -likely secondary to autonomic dysfunction.  Has baseline elevated blood pressures.  Was tried on midodrine which had to be stopped due to systolic pressures severely elevated 190s to 200s. --TED hose and abdominal binder when oob/mobility --Continue working with PT as much as possible --Recheck orthostatics periodically --Continue midodrine on dialysis days   ESRD -unknown baseline renal function.  Patient unsure primary nephrologist name in [place]. Presented with creatinine 2.97.  Patient reported plan was to start dialysis and consideration of future renal transplant.  Renal function worsened over course of admission secondary to diuresis for acute CHF and volume overload.  She has been started on dialysis.   Anticipate outpatient  dialysis will be [place]. --Nephrology following   Pericardial effusion, moderate -seen on echo, no evidence of  tamponade.  No recent infections to attribute this to.  In the setting of CKD 5 and elevated BUN, likely secondary to uremia.  Patient has no chest pain.  Cardiology recommended outpatient echo for surveillance.   Essential hypertension - with labile BP's, orthostatic hypotension. PTA was on multiple agents including Coreg, Cozaar, torsemide. Had been tried on midodrine for orthostasis but stopped due to severely elevated pressures.   Losartan discontinued and started on clonidine by nephrology.   --Continue Coreg 12.5>>25 mg mg BID (also helps HA's) --Continue clonidine 0.1 mg patch every 7 days --Continue torsemide 10 mg daily --Also on verapamil for HA's as above   Anemia of chronic renal disease -with macrocytosis on CBC.  TIBC low but otherwise normal iron studies.  Vitamin B12 level 358.  Continue EPO with dialysis per renal.  Monitor CBC.   History of type 2 diabetes -last A1c 4.7%, not currently diabetic and CBGs well controlled.     History of tobacco abuse -quit at least 10 years ago, prior 20-pack-year history.   Insomnia -continue melatonin and trazodone.  No vitals overnight when patient sleeping, and minimize interruptions in sleep.   Pressure injury, mid coccyx -present on admission. Pressure Injury 04/30/20 Coccyx Mid Stage 2 -  Partial thickness loss of dermis presenting as a shallow open injury with a red, pink wound bed without slough. (Active)  04/30/20 2130  Location: Coccyx  Location Orientation: Mid  Staging: Stage 2 -  Partial thickness loss of dermis presenting as a shallow open injury with a red, pink wound bed without slough.  Wound Description (Comments):   Present on Admission: Yes     DVT prophylaxis: heparin injection 5,000 Units Start: 05/10/20 2200   Diet:  Diet Orders (From admission, onward)    Start     Ordered   05/31/20 1638  Diet 2 gram sodium Room service appropriate? Yes; Fluid consistency: Thin; Fluid restriction: 1200 mL Fluid  Diet  effective now       Question Answer Comment  Room service appropriate? Yes   Fluid consistency: Thin   Fluid restriction: 1200 mL Fluid      05/31/20 1638            Code Status: Full Code    Subjective 06/27/20    Patient seen during dialysis today.  She reports having fairly severe headache, says they are always worse with dialysis.  She otherwise reports feeling well and has no acute complaints.   Disposition Plan & Communication   Status is: Inpatient  Remains inpatient appropriate because: unsafe discharge.  Pt requires SNF due to profound generalized weakness and inadequate assistance to safely go home. Bed search ongoing.   Dispo: The patient is from: Home              Anticipated d/c is to: SNF              Anticipated d/c date is: Pending SNF bed              Patient currently is medically stable to d/c.   Difficult to place patient Yes    Consults, Procedures, Significant Events   Consultants:   Nephrology  Neurology  Procedures:   Dialysis  Antimicrobials:  Anti-infectives (From admission, onward)   Start     Dose/Rate Route Frequency Ordered Stop   05/16/20 0905  clindamycin (CLEOCIN) 300  MG/50ML IVPB       Note to Pharmacy: Carlynn Spry   : cabinet override      05/16/20 0905 05/16/20 0920        Objective   Vitals:   06/27/20 0500 06/27/20 0859 06/27/20 1100 06/27/20 1548  BP:  (!) 160/70 (!) 181/82 (!) 153/70  Pulse:  70  77  Resp:  '16 14 16  '$ Temp:  98.4 F (36.9 C) 97.9 F (36.6 C) 98.2 F (36.8 C)  TempSrc:  Oral    SpO2:  96%  96%  Weight: 72.2 kg     Height:       No intake or output data in the 24 hours ending 06/27/20 1618 Filed Weights   06/25/20 0500 06/26/20 0500 06/27/20 0500  Weight: 71.6 kg 72.4 kg 72.2 kg    Physical Exam:  General exam: in dialysis, awake, alert, no acute distress HEENT: clear conjunctiva, moist mucus membranes, hearing grossly normal Respiratory system: symmetric chest rise, normal  respiratory effort, on room air. Cardiovascular system: RRR, no pedal edema.   Central nervous system: A&O x3. no gross focal neurologic deficits, normal speech Extremities: normal tone, no edema    Labs   Data Reviewed: I have personally reviewed following labs and imaging studies  CBC: Recent Labs  Lab 06/21/20 0433 06/24/20 0500 06/27/20 0909  WBC 5.7 6.0 5.8  HGB 9.8* 9.0* 10.6*  HCT 30.8* 27.1* 31.8*  MCV 99.0 95.1 94.4  PLT 258 265 [id number]   Basic Metabolic Panel: Recent Labs  Lab 06/21/20 0433 06/24/20 0500 06/27/20 0909  NA 137 136 137  K 4.3 4.5 2.9*  CL 102 104 103  CO2 '27 24 26  '$ GLUCOSE 103* 112* 94  BUN 29* 51* 25*  CREATININE 2.15* 3.00* 1.39*  CALCIUM 8.5* 8.2* 8.0*  MG 2.3 2.3  --   PHOS  --  6.1* 2.7   GFR: Estimated Creatinine Clearance: 42.7 mL/min (A) (by C-G formula based on SCr of 1.39 mg/dL (H)). Liver Function Tests: Recent Labs  Lab 06/21/20 0433 06/27/20 0909  AST 13*  --   ALT 9  --   ALKPHOS 50  --   BILITOT 0.8  --   PROT 4.9*  --   ALBUMIN 2.7* 2.7*   No results for input(s): LIPASE, AMYLASE in the last 168 hours. No results for input(s): AMMONIA in the last 168 hours. Coagulation Profile: No results for input(s): INR, PROTIME in the last 168 hours. Cardiac Enzymes: No results for input(s): CKTOTAL, CKMB, CKMBINDEX, TROPONINI in the last 168 hours. BNP (last 3 results) No results for input(s): PROBNP in the last 8760 hours. HbA1C: No results for input(s): HGBA1C in the last 72 hours. CBG: Recent Labs  Lab 06/23/20 1153 06/23/20 1616 06/23/20 2014 06/26/20 0908  GLUCAP 120* 129* 102* 84   Lipid Profile: No results for input(s): CHOL, HDL, LDLCALC, TRIG, CHOLHDL, LDLDIRECT in the last 72 hours. Thyroid Function Tests: No results for input(s): TSH, T4TOTAL, FREET4, T3FREE, THYROIDAB in the last 72 hours. Anemia Panel: No results for input(s): VITAMINB12, FOLATE, FERRITIN, TIBC, IRON, RETICCTPCT in the last 72  hours. Sepsis Labs: No results for input(s): PROCALCITON, LATICACIDVEN in the last 168 hours.  No results found for this or any previous visit (from the past 240 hour(s)).    Imaging Studies   No results found.   Medications   Scheduled Meds: . carvedilol  12.5 mg Oral BID WC  . Chlorhexidine Gluconate Cloth  6 each Topical  [id number]  . cholecalciferol  1,000 Units Oral Daily  . cloNIDine  0.1 mg Transdermal Weekly  . divalproex  500 mg Oral Q12H  . [START ON 06/28/2020] divalproex  500 mg Oral QHS  . [START ON 06/29/2020] epoetin (EPOGEN/PROCRIT) injection  4,000 Units Intravenous Q M,W,F-HD  . feeding supplement  237 mL Oral BID BM  . heparin  5,000 Units Subcutaneous Q8H  . lidocaine  1 patch Transdermal Q24H  . melatonin  5 mg Oral QPM  . multivitamin  1 tablet Oral QHS  . polyethylene glycol  17 g Oral Daily  . senna-docusate  1 tablet Oral BID  . sevelamer carbonate  800 mg Oral TID WC  . sodium chloride flush  3 mL Intravenous Q12H  . topiramate  50 mg Oral BID  . torsemide  10 mg Oral Daily  . traZODone  50 mg Oral QPM  . venlafaxine  37.5 mg Oral BID WC  . verapamil  180 mg Oral QHS  . vitamin B-12  100 mcg Oral Daily   Continuous Infusions: . sodium chloride    . albumin human 25 g (06/20/20 1143)       LOS: 57 days    Time spent: 20 minutes    Ezekiel Slocumb, DO [facility]  06/27/2020, 4:18 PM      If 7PM-7AM, please contact night-coverage. How to contact the [facility] Attending or Consulting provider [place] or covering provider during after hours [place], for this patient?    1. Check the care team in [facility] and look for a) attending/consulting TRH provider listed and b) the [facility] team listed 2. Log into [email or web address] and use [place]'s universal password to access. If you do not have the password, please contact the hospital operator. 3. Locate the [facility] provider you are looking for under [facility] and page to a number that you can be  directly reached. 4. If you still have difficulty reaching the provider, please page the [facility] (Director on Call) for the Hospitalists listed on amion for assistance.

## 2020-06-27 NOTE — Progress Notes (Signed)
PT Cancellation Note  Patient Details Name: Katie [name] MRN: [id number] DOB: 05-Feb-1956   Cancelled Treatment:    Reason Eval/Treat Not Completed: Other (comment). Pt out of room at this time, PT to re-attempt as able.   Lieutenant Diego PT, DPT [phone] PM,06/27/20

## 2020-06-28 LAB — BASIC METABOLIC PANEL
Anion gap: 8 (ref 5–15)
BUN: 39 mg/dL — ABNORMAL HIGH (ref 8–23)
CO2: 27 mmol/L (ref 22–32)
Calcium: 8.2 mg/dL — ABNORMAL LOW (ref 8.9–10.3)
Chloride: 103 mmol/L (ref 98–111)
Creatinine, Ser: 2.22 mg/dL — ABNORMAL HIGH (ref 0.44–1.00)
GFR, Estimated: 24 mL/min — ABNORMAL LOW (ref >60)
Glucose, Bld: 94 mg/dL (ref 70–99)
Potassium: 3.3 mmol/L — ABNORMAL LOW (ref 3.5–5.1)
Sodium: 138 mmol/L (ref 135–145)

## 2020-06-28 LAB — CBC
HCT: 31.8 % — ABNORMAL LOW (ref 36.0–46.0)
Hemoglobin: 10.2 g/dL — ABNORMAL LOW (ref 12.0–15.0)
MCH: 30.8 pg (ref 26.0–34.0)
MCHC: 32.1 g/dL (ref 30.0–36.0)
MCV: 96.1 fL (ref 80.0–100.0)
Platelets: 274 10*3/uL (ref 150–400)
RBC: 3.31 MIL/uL — ABNORMAL LOW (ref 3.87–5.11)
RDW: 13.5 % (ref 11.5–15.5)
WBC: 5.4 10*3/uL (ref 4.0–10.5)
nRBC: 0 % (ref 0.0–0.2)

## 2020-06-28 LAB — MAGNESIUM: Magnesium: 2.2 mg/dL (ref 1.7–2.4)

## 2020-06-28 MED ORDER — POTASSIUM CHLORIDE CRYS ER 20 MEQ PO TBCR
40.0000 meq | EXTENDED_RELEASE_TABLET | Freq: Once | ORAL | Status: AC
  Administered 2020-06-28: 40 meq via ORAL
  Filled 2020-06-28: qty 2

## 2020-06-28 NOTE — Progress Notes (Signed)
PROGRESS NOTE    Katie [name]   [id number]  DOB: Jan 08, 1956  PCP: Patient, [name]    DOA: 04/30/2020 LOS: 76   Brief Narrative   65 y.o. female with a history of diabetes mellitus type 2, stage IV CKD, heart failure, hypertension. Patient presented to the ED on 04/30/20 with complaints of worsening dyspnea with evidence of volume overload and pleural effusion.  Hospital stay has been prolonged due to disposition challenges and no safe discharge plan, due to lack of insurance difficulty with SNF placement in patient too profoundly weak to safely return home.    Acute medical issues have resolved and patient is medically stable for discharge once adequate plans are in place.  Senior case management involved and bed search has been expanded.  Ongoing issues are orthostatic hypotension and headaches.  Neurology consulted and patient on multiple agents for prevention and abortive therapy.  Now off opiates completely so hopeful overuse headache component continues to improve.      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  Has been transitioned entirely off opioids.   MRI brain, MRA, MRV were unremarkable --Neurology consulted, recommendations: --d/c Imitrex (has overused) --continue prophylactics: venlafaxine, Topamax  --continue Depakote taper & tizanidine to help wean off  opiates --clonidine patch for more even BP control  --addition of verapamil-SR 240 mg qHS --tylenol 1g q8h PRN  --Outpatient neurology follow-up for nerve blocks, botox and CGRP inhibitor consideration    Acute on chronic diastolic CHF -echo in December 2021 showed normal EF and grade 2 diastolic dysfunction, associated RV systolic dysfunction and elevated PA pressure seen on TTE however right heart cath on 12/28 significantly for mildly elevated pulmonary artery pressures. Presented volume overloaded, initially managed with IV Lasix but had worsening renal function.  Unfortunately her CKD has progressed to ESRD and now on dialysis.   --Continue dialysis per nephrology --Continue torsemide 10 mg daily --Monitor volume status closely: Daily weights.   Pleural effusion, right side -likely due to volume overload in the setting of diastolic dysfunction and renal failure.  Status post right thoracentesis on 12/20.  No evidence of malignancy was seen on cytology.  Monitor.   Orthostatic hypotension -likely secondary to autonomic dysfunction.  Has baseline elevated blood pressures.  Was tried on midodrine which had to be stopped due to systolic pressures severely elevated 190s to 200s. --TED hose and abdominal binder when oob/mobility --Continue working with PT as much as possible --Recheck orthostatics periodically --Continue midodrine on dialysis days   ESRD -unknown baseline renal function.  Patient unsure primary nephrologist name in [place]. Presented with creatinine 2.97.  Patient reported plan was to start dialysis and consideration of future renal transplant.  Renal function worsened over course of admission secondary to diuresis for acute CHF and volume overload.  She has been started on dialysis.   Anticipate outpatient dialysis will be  [place]. --Nephrology following   Pericardial effusion, moderate -seen on echo, no evidence of tamponade.  No recent infections to  attribute this to.  In the setting of CKD 5 and elevated BUN, likely secondary to uremia.  Patient has no chest pain.  Cardiology recommended outpatient echo for surveillance.   Essential hypertension - with labile BP's, orthostatic hypotension. PTA was on multiple agents including Coreg, Cozaar, torsemide. Had been tried on midodrine for orthostasis but stopped due to severely elevated pressures.   Losartan discontinued and started on clonidine by nephrology.   --Continue Coreg 25 mg mg BID (also helps HA's) --Continue clonidine 0.1 mg patch every 7 days --Continue torsemide 10 mg daily --Also on verapamil for HA's as above --May require additional agent   Anemia of chronic renal disease -with macrocytosis on CBC.  TIBC low but otherwise normal iron studies.  Vitamin B12 level 358.  Continue EPO with dialysis per renal.  Monitor CBC.   History of type 2 diabetes -last A1c 4.7%, not currently diabetic and CBGs well controlled.     History of tobacco abuse -quit at least 10 years ago, prior 20-pack-year history.   Insomnia -continue melatonin and trazodone.  No vitals overnight when patient sleeping, and minimize interruptions in sleep.   Pressure injury, mid coccyx -present on admission. Pressure Injury 04/30/20 Coccyx Mid Stage 2 -  Partial thickness loss of dermis presenting as a shallow open injury with a red, pink wound bed without slough. (Active)  04/30/20 2130  Location: Coccyx  Location Orientation: Mid  Staging: Stage 2 -  Partial thickness loss of dermis presenting as a shallow open injury with a red, pink wound bed without slough.  Wound Description (Comments):   Present on Admission: Yes     DVT prophylaxis: heparin injection 5,000 Units Start: 05/10/20 2200   Diet:  Diet Orders (From admission, onward)    Start     Ordered   05/31/20 1638  Diet 2 gram sodium Room service appropriate? Yes; Fluid consistency: Thin; Fluid restriction: 1200 mL Fluid  Diet effective  now       Question Answer Comment  Room service appropriate? Yes   Fluid consistency: Thin   Fluid restriction: 1200 mL Fluid      05/31/20 1638            Code Status: Full Code    Subjective 06/28/20    Patient again has headache today, but not as bad as during dialysis yesterday.  We discussed the reason for stopping tramadol, per neurology's recommendation.  She understands.  No other acute complaints.  She says the friend she lives with works, not home during the day.     Disposition Plan & Communication   Status is: Inpatient  Remains inpatient appropriate because: unsafe discharge.  Pt requires SNF due to profound generalized weakness and inadequate assistance to safely go home. Bed search ongoing.   Dispo: The patient is from: Home              Anticipated d/c is to: SNF              Anticipated d/c date is: Pending SNF bed              Patient currently is medically stable to d/c.   Difficult to place patient Yes    Consults, Procedures, Significant Events   Consultants:   Nephrology  Neurology  Procedures:   Dialysis  Antimicrobials:  Anti-infectives (From admission, onward)  Start     Dose/Rate Route Frequency Ordered Stop   05/16/20 0905  clindamycin (CLEOCIN) 300 MG/50ML IVPB       Note to Pharmacy: Carlynn Spry   : cabinet override      05/16/20 0905 05/16/20 0920        Objective   Vitals:   06/28/20 0036 06/28/20 0507 06/28/20 0758 06/28/20 1216  BP: (!) 160/77 (!) 179/71 (!) 174/75 (!) 153/62  Pulse: 63 62 63 64  Resp: '18 17 16 20  '$ Temp: 98 F (36.7 C) (!) 97.5 F (36.4 C) 97.9 F (36.6 C) (!) 97.5 F (36.4 C)  TempSrc: Oral Oral  Oral  SpO2: 96% 99% 99% 98%  Weight:      Height:       No intake or output data in the 24 hours ending 06/28/20 1441 Filed Weights   06/25/20 0500 06/26/20 0500 06/27/20 0500  Weight: 71.6 kg 72.4 kg 72.2 kg    Physical Exam:  General exam: awake, laying in bed, no acute  distress Respiratory system: symmetric chest rise, normal respiratory effort, on room air. Cardiovascular system: RRR, no pedal edema.   Central nervous system: A&O x3. no gross focal neurologic deficits, normal speech GI: soft and non-tender Extremities: no edema, no cyanosis, clubbing of fingers noted    Labs   Data Reviewed: I have personally reviewed following labs and imaging studies  CBC: Recent Labs  Lab 06/24/20 0500 06/27/20 0909 06/28/20 0542  WBC 6.0 5.8 5.4  HGB 9.0* 10.6* 10.2*  HCT 27.1* 31.8* 31.8*  MCV 95.1 94.4 96.1  PLT 265 276 [id number]   Basic Metabolic Panel: Recent Labs  Lab 06/24/20 0500 06/27/20 0909 06/28/20 0542  NA 136 137 138  K 4.5 2.9* 3.3*  CL 104 103 103  CO2 '24 26 27  '$ GLUCOSE 112* 94 94  BUN 51* 25* 39*  CREATININE 3.00* 1.39* 2.22*  CALCIUM 8.2* 8.0* 8.2*  MG 2.3  --  2.2  PHOS 6.1* 2.7  --    GFR: Estimated Creatinine Clearance: 26.8 mL/min (A) (by C-G formula based on SCr of 2.22 mg/dL (H)). Liver Function Tests: Recent Labs  Lab 06/27/20 0909  ALBUMIN 2.7*   No results for input(s): LIPASE, AMYLASE in the last 168 hours. No results for input(s): AMMONIA in the last 168 hours. Coagulation Profile: No results for input(s): INR, PROTIME in the last 168 hours. Cardiac Enzymes: No results for input(s): CKTOTAL, CKMB, CKMBINDEX, TROPONINI in the last 168 hours. BNP (last 3 results) No results for input(s): PROBNP in the last 8760 hours. HbA1C: No results for input(s): HGBA1C in the last 72 hours. CBG: Recent Labs  Lab 06/23/20 1153 06/23/20 1616 06/23/20 2014 06/26/20 0908  GLUCAP 120* 129* 102* 84   Lipid Profile: No results for input(s): CHOL, HDL, LDLCALC, TRIG, CHOLHDL, LDLDIRECT in the last 72 hours. Thyroid Function Tests: No results for input(s): TSH, T4TOTAL, FREET4, T3FREE, THYROIDAB in the last 72 hours. Anemia Panel: No results for input(s): VITAMINB12, FOLATE, FERRITIN, TIBC, IRON, RETICCTPCT in the last 72  hours. Sepsis Labs: No results for input(s): PROCALCITON, LATICACIDVEN in the last 168 hours.  No results found for this or any previous visit (from the past 240 hour(s)).    Imaging Studies   No results found.   Medications   Scheduled Meds: . carvedilol  25 mg Oral BID WC  . Chlorhexidine Gluconate Cloth  6 each Topical Q0600  . cholecalciferol  1,000 Units Oral Daily  .  cloNIDine  0.1 mg Transdermal Weekly  . divalproex  500 mg Oral QHS  . [START ON 06/29/2020] epoetin (EPOGEN/PROCRIT) injection  4,000 Units Intravenous Q M,W,F-HD  . feeding supplement  237 mL Oral BID BM  . heparin  5,000 Units Subcutaneous Q8H  . lidocaine  1 patch Transdermal Q24H  . melatonin  5 mg Oral QPM  . multivitamin  1 tablet Oral QHS  . polyethylene glycol  17 g Oral Daily  . senna-docusate  1 tablet Oral BID  . sevelamer carbonate  800 mg Oral TID WC  . sodium chloride flush  3 mL Intravenous Q12H  . topiramate  50 mg Oral BID  . torsemide  10 mg Oral Daily  . traZODone  50 mg Oral QPM  . venlafaxine  37.5 mg Oral BID WC  . verapamil  240 mg Oral QHS  . vitamin B-12  100 mcg Oral Daily   Continuous Infusions: . sodium chloride    . albumin human 25 g (06/20/20 1143)       LOS: 58 days    Time spent: 25 minutes with > 50% spent at bedside and in coordination of care    Ezekiel Slocumb, DO [facility]  06/28/2020, 2:41 PM      If 7PM-7AM, please contact night-coverage. How to contact the [facility] Attending or Consulting provider [place] or covering provider during after hours [place], for this patient?    1. Check the care team in [facility] and look for a) attending/consulting TRH provider listed and b) the [facility] team listed 2. Log into [email or web address] and use [facility]'s universal password to access. If you do not have the password, please contact the hospital operator. 3. Locate the [facility] provider you are looking for under [facility] and page to a number that you can be  directly reached. 4. If you still have difficulty reaching the provider, please page the [facility] (Director on Call) for the Hospitalists listed on amion for assistance.

## 2020-06-28 NOTE — Progress Notes (Signed)
[facility]  ROUNDING NOTE   Subjective:   Patient laying in bed with covers over her head States she has a headache Says it's better than yesterday. She can tell an improvement with new medications.  States she is having less migraine days. Hasn't eaten breakfast this morning due to headache Denies shortness of breath and chest pain    Objective:  Vital signs in last 24 hours:  Temp:  [97.5 F (36.4 C)-98.2 F (36.8 C)] 97.5 F (36.4 C) (02/15 1216) Pulse Rate:  [62-77] 64 (02/15 1216) Resp:  [16-20] 20 (02/15 1216) BP: (149-188)/(62-80) 153/62 (02/15 1216) SpO2:  [96 %-99 %] 98 % (02/15 1216)  Weight change:  Filed Weights   06/25/20 0500 06/26/20 0500 06/27/20 0500  Weight: 71.6 kg 72.4 kg 72.2 kg    Intake/Output: I/O last 3 completed shifts: In: -  Out: 1507 [Other:1507]   Intake/Output this shift:  No intake/output data recorded.  Physical Exam: General: NAD, resting in bed  Head: Normocephalic, atraumatic. Moist oral mucosal membranes  Eyes: Anicteric  Neck: Supple, trachea midline  Lungs:  Clear bilaterally  Heart: Regular rate and rhythm  Abdomen:  Soft, nontender,   Extremities:  trace peripheral edema.  Neurologic: Nonfocal, moving all four extremities  Skin: No lesions  Access: Right IJ Permcath     Basic Metabolic Panel: Recent Labs  Lab 06/24/20 0500 06/27/20 0909 06/28/20 0542  NA 136 137 138  K 4.5 2.9* 3.3*  CL 104 103 103  CO2 '24 26 27  '$ GLUCOSE 112* 94 94  BUN 51* 25* 39*  CREATININE 3.00* 1.39* 2.22*  CALCIUM 8.2* 8.0* 8.2*  MG 2.3  --  2.2  PHOS 6.1* 2.7  --     Liver Function Tests: Recent Labs  Lab 06/27/20 0909  ALBUMIN 2.7*   No results for input(s): LIPASE, AMYLASE in the last 168 hours. No results for input(s): AMMONIA in the last 168 hours.  CBC: Recent Labs  Lab 06/24/20 0500 06/27/20 0909 06/28/20 0542  WBC 6.0 5.8 5.4  HGB 9.0* 10.6* 10.2*  HCT 27.1* 31.8* 31.8*  MCV 95.1 94.4 96.1   PLT 265 276 274    Cardiac Enzymes: No results for input(s): CKTOTAL, CKMB, CKMBINDEX, TROPONINI in the last 168 hours.  BNP: Invalid input(s): POCBNP  CBG: Recent Labs  Lab 06/23/20 1153 06/23/20 1616 06/23/20 2014 06/26/20 0908  GLUCAP 120* 129* 102* 84    Microbiology: Results for orders placed or performed during the hospital encounter of 04/30/20  Resp Panel by RT-PCR (Flu A&B, Covid) Nasopharyngeal Swab     Status: None   Collection Time: 04/30/20  4:01 PM   Specimen: Nasopharyngeal Swab; Nasopharyngeal(NP) swabs in vial transport medium  Result Value Ref Range Status   SARS Coronavirus 2 by RT PCR NEGATIVE NEGATIVE Final    Comment: (NOTE) SARS-CoV-2 target nucleic acids are NOT DETECTED.  The SARS-CoV-2 RNA is generally detectable in upper respiratory specimens during the acute phase of infection. The lowest concentration of SARS-CoV-2 viral copies this assay can detect is 138 copies/mL. A negative result does not preclude SARS-Cov-2 infection and should not be used as the sole basis for treatment or other patient management decisions. A negative result may occur with  improper specimen collection/handling, submission of specimen other than nasopharyngeal swab, presence of viral mutation(s) within the areas targeted by this assay, and inadequate number of viral copies(<138 copies/mL). A negative result must be combined with clinical observations, patient history, and epidemiological information. The  expected result is Negative.  Fact Sheet for Patients:  EntrepreneurPulse.com.au  Fact Sheet for Healthcare Providers:  IncredibleEmployment.be  This test is no t yet approved or cleared by the [place] FDA and  has been authorized for detection and/or diagnosis of SARS-CoV-2 by FDA under an Emergency Use Authorization (EUA). This EUA will remain  in effect (meaning this test can be used) for the duration of the COVID-19  declaration under Section 564(b)(1) of the Act, 21 U.S.C.section 360bbb-3(b)(1), unless the authorization is terminated  or revoked sooner.       Influenza A by PCR NEGATIVE NEGATIVE Final   Influenza B by PCR NEGATIVE NEGATIVE Final    Comment: (NOTE) The Xpert Xpress SARS-CoV-2/FLU/RSV plus assay is intended as an aid in the diagnosis of influenza from Nasopharyngeal swab specimens and should not be used as a sole basis for treatment. Nasal washings and aspirates are unacceptable for Xpert Xpress SARS-CoV-2/FLU/RSV testing.  Fact Sheet for Patients: EntrepreneurPulse.com.au  Fact Sheet for Healthcare Providers: IncredibleEmployment.be  This test is not yet approved or cleared by the [place] FDA and has been authorized for detection and/or diagnosis of SARS-CoV-2 by FDA under an Emergency Use Authorization (EUA). This EUA will remain in effect (meaning this test can be used) for the duration of the COVID-19 declaration under Section 564(b)(1) of the Act, 21 U.S.C. section 360bbb-3(b)(1), unless the authorization is terminated or revoked.  Performed at [facility], [address] [place], [place] [address]   Body fluid culture     Status: None   Collection Time: 05/02/20  4:44 PM   Specimen: PATH Cytology Pleural fluid  Result Value Ref Range Status   Specimen Description   Final    PLEURAL Performed at [facility], [address], [place], [place] [address]    Special Requests   Final    NONE Performed at [facility], [address] [place], [place] [address]    Gram Stain   Final    MODERATE WBC PRESENT, PREDOMINANTLY MONONUCLEAR NO ORGANISMS SEEN    Culture   Final    NO GROWTH Performed at [facility], [place] [address], [place], [place] [address]    Report Status 05/06/2020 FINAL  Final  MRSA PCR Screening     Status: None   Collection Time: 05/16/20  9:29 PM   Specimen: Nasal  Mucosa; Nasopharyngeal  Result Value Ref Range Status   MRSA by PCR NEGATIVE NEGATIVE Final    Comment:        The GeneXpert MRSA Assay (FDA approved for NASAL specimens only), is one component of a comprehensive MRSA colonization surveillance program. It is not intended to diagnose MRSA infection nor to guide or monitor treatment for MRSA infections. Performed at [facility], [address] [place],  [address]     Coagulation Studies: No results for input(s): LABPROT, INR in the last 72 hours.  Urinalysis: No results for input(s): COLORURINE, LABSPEC, PHURINE, GLUCOSEU, HGBUR, BILIRUBINUR, KETONESUR, PROTEINUR, UROBILINOGEN, NITRITE, LEUKOCYTESUR in the last 72 hours.  Invalid input(s): APPERANCEUR    Imaging: No results found.   Medications:   . sodium chloride    . albumin human 25 g (06/20/20 1143)   . carvedilol  25 mg Oral BID WC  . Chlorhexidine Gluconate Cloth  6 each Topical Q0600  . cholecalciferol  1,000 Units Oral Daily  . cloNIDine  0.1 mg Transdermal Weekly  . divalproex  500 mg Oral QHS  . [  START ON 06/29/2020] epoetin (EPOGEN/PROCRIT) injection  4,000 Units Intravenous Q M,W,F-HD  . feeding supplement  237 mL Oral BID BM  . heparin  5,000 Units Subcutaneous Q8H  . lidocaine  1 patch Transdermal Q24H  . melatonin  5 mg Oral QPM  . multivitamin  1 tablet Oral QHS  . polyethylene glycol  17 g Oral Daily  . senna-docusate  1 tablet Oral BID  . sevelamer carbonate  800 mg Oral TID WC  . sodium chloride flush  3 mL Intravenous Q12H  . topiramate  50 mg Oral BID  . torsemide  10 mg Oral Daily  . traZODone  50 mg Oral QPM  . venlafaxine  37.5 mg Oral BID WC  . verapamil  240 mg Oral QHS  . vitamin B-12  100 mcg Oral Daily   sodium chloride, acetaminophen **OR** acetaminophen, ALPRAZolam, benzonatate, bisacodyl, bisacodyl, diphenhydrAMINE, ondansetron **OR** ondansetron (ZOFRAN) IV, prochlorperazine, sodium chloride flush, [COMPLETED]  tiZANidine **FOLLOWED BY** tiZANidine  Assessment/ Plan:  [name] is a 65 y.o.  female female with diastolic congestive heart failure, HTN, DM type II, CVA was admitted on 04/30/20 for volume overload.   Patient moved from [place] in December 2021. She was following with nephrology there. Patient initiated on hemodialysis this admission. Patient has a prolonged hospitalization. She was initially admitted with volume overload and acute exacerbation of congestive heart failure. Failed IV diuretics and started on hemodialysis on 12/29. No outpatient dialysis due to patient's lack of insurance and not a resident of the state of [place]. Held dialysis for possible recovery however after several days, creatinine rising and volume accumulation. Patient then resumed on dialysis. She has been weaned down to twice a week  1. ESRD on Monday ,Friday dialysis  -Received HD yesterday -Removed 1.5L of fluid -Next schedule treatment on Friday  2. Hypertension with CKD  - BP  elevated  -current medications offer improved control  3. Secondary Hyperparathyroidism of renal origin   - Currently prescribed Renvela TID  4. Anemia of CKD  -HGB currently 10.2. -EPO given during dialysis   LOS: 58 Colon Flattery, NP 2/15/20221:24 PM

## 2020-06-28 NOTE — Progress Notes (Signed)
Physical Therapy Treatment Patient Details Name: Katie [name] MRN: [id number] DOB: 09-Nov-1955 Today's Date: 06/28/2020    History of Present Illness Patient is a 65 y.o. female with a history of diabetes mellitus type 2, stage IV CKD, heart failure, hypertension. Patient presented secondary to dyspnea with evidence of volume overload and pleural effusion. Diagnosed with moderate pericardial effusion, mild to moderate aortic insufficiency and aortic stenosis.  S/p R heart cath 12/28.  R temp fem cath placed 12/29; 1/3 R IJ permcath placed; 1/4 R temp fem cath removed.    PT Comments    Pt was long sitting in bed with blanket covering her head. She is awake and responds to Chief Strategy Officer. Reports she is keeping blankets over head 2/2 to having headache (sensitivity to light). BP at rest in bed = 148/84. Applied TED stocking prior to OOB activity. Unwilling to use abdominal binder. She was able to progress to short sit EOB without assistance, stood to RW (w/c follow fopr safety) and ambulated 12 ft prior to requesting to sit 2/2 to lightheadedness/ dizziness. BP 86/56. Sat in w/c for several minutes prior to pt requesting to attempt again. Stood and ambulated 25 ft with RW on 2nd trial prior to needing seated rest. Lengthy discussion with pt about DC disposition and POC going forward. Pt is planning to DC to friends house. Discussed safety concerns and expectations. Pt seemed to understand and was in good spirits at conclusion of session. Pt would greatly benefit from SNF however per CM, this is not an option. Acute PT will continue to follow and progress as able per POC. Recommend [place] services if/when pt DCs home/friends house.     Follow Up Recommendations  Supervision for mobility/OOB;SNF     Equipment Recommendations  Wheelchair (measurements PT);Wheelchair cushion (measurements PT);Rolling walker with 5" wheels;3in1 (PT)    Recommendations for Other Services       Precautions / Restrictions  Precautions Precautions: Fall Precaution Comments: R IJ permcath; monitor orthostatics Restrictions Weight Bearing Restrictions: No    Mobility  Bed Mobility Overal bed mobility: Modified Independent Bed Mobility: Supine to Sit Rolling: Modified independent (Device/Increase time)   Supine to sit: Modified independent (Device/Increase time) Sit to supine: Modified independent (Device/Increase time)        Transfers Overall transfer level: Needs assistance Equipment used: Rolling walker (2 wheeled) Transfers: Sit to/from Stand Sit to Stand: Supervision         General transfer comment: P)t stood from EOB prior to ambulation. no physical assistance to stand.  Ambulation/Gait Ambulation/Gait assistance: Min guard Gait Distance (Feet): 25 Feet Assistive device: Rolling walker (2 wheeled) Gait Pattern/deviations: Step-through pattern;Decreased stride length Gait velocity: slightly decreased   General Gait Details: Pt ambulated 2 x total. 1 x 12 ft and 1 x 25 ft with RW + chair follow.      Balance Overall balance assessment: Needs assistance Sitting-balance support: Feet supported Sitting balance-Leahy Scale: Good     Standing balance support: Bilateral upper extremity supported;During functional activity Standing balance-Leahy Scale: Fair Standing balance comment: no LOB limited standing endurance 2/2 dizziness/ hypotensive symptoms         Cognition Arousal/Alertness: Awake/alert Behavior During Therapy: WFL for tasks assessed/performed Overall Cognitive Status: Within Functional Limits for tasks assessed        General Comments: Pt A and O x4. Pt was pleasant throughout session             Pertinent Vitals/Pain Pain Assessment: 0-10 Pain Score: 3  Pain Location:  headache, chronic back pain Pain Descriptors / Indicators: Headache Pain Intervention(s): Limited activity within patient's tolerance;Monitored during session;Premedicated before  session;Repositioned           PT Goals (current goals can now be found in the care plan section) Acute Rehab PT Goals Patient Stated Goal: to go home (friends house) when safe Progress towards PT goals: Not progressing toward goals - comment (pt has orthostatic hypotensive symptoms at baseline)    Frequency    Min 2X/week      PT Plan Current plan remains appropriate       AM-PAC PT "6 Clicks" Mobility   Outcome Measure  Help needed turning from your back to your side while in a flat bed without using bedrails?: None Help needed moving from lying on your back to sitting on the side of a flat bed without using bedrails?: A Little Help needed moving to and from a bed to a chair (including a wheelchair)?: A Little Help needed standing up from a chair using your arms (e.g., wheelchair or bedside chair)?: A Little Help needed to walk in hospital room?: A Little Help needed climbing 3-5 steps with a railing? : A Little 6 Click Score: 19    End of Session Equipment Utilized During Treatment: Gait belt;Other (comment) (w/c follow) Activity Tolerance: Treatment limited secondary to medical complications (Comment) (limited by hypotension) Patient left: in chair;with call bell/phone within reach;with bed alarm set;Other (comment) (pt unwilling to stay sitting up at conclusion of session) Nurse Communication: Mobility status PT Visit Diagnosis: Unsteadiness on feet (R26.81);Muscle weakness (generalized) (M62.81);History of falling (Z91.81);Other abnormalities of gait and mobility (R26.89)     Time: [id number] PT Time Calculation (min) (ACUTE ONLY): 30 min  Charges:  $Gait Training: 8-22 mins $Therapeutic Activity: 8-22 mins                     Julaine Fusi PTA 06/28/20, 2:59 PM

## 2020-06-29 ENCOUNTER — Inpatient Hospital Stay: Payer: Medicaid Other

## 2020-06-29 LAB — HEPATITIS B CORE ANTIBODY, IGM: Hep B C IgM: NONREACTIVE

## 2020-06-29 LAB — HEPATITIS B CORE ANTIBODY, TOTAL: Hep B Core Total Ab: NONREACTIVE

## 2020-06-29 LAB — HEPATITIS B SURFACE ANTIGEN: Hepatitis B Surface Ag: NONREACTIVE

## 2020-06-29 MED ORDER — LIDOCAINE 5 % EX PTCH
1.0000 | MEDICATED_PATCH | Freq: Every day | CUTANEOUS | Status: DC | PRN
  Administered 2020-06-30 – 2020-07-01 (×2): 1 via TRANSDERMAL
  Filled 2020-06-29 (×3): qty 1

## 2020-06-29 NOTE — TOC Progression Note (Signed)
Transition of Care [facility]) - Progression Note    Patient Details  Name: Katie [name] MRN: [id number] Date of Birth: 08-25-1955  Transition of Care [facility]) CM/SW [place], LCSW Phone Number: 06/29/2020, 1:25 PM  Clinical Narrative:      TOC still unable to find SNF accepting LOG. After discussion with TOC Leadership, plan will be home with charity home health and DME. PT following up with patient's friend/roommate for education. Patient will need outpatient dialysis chair time prior to being able to DC. Updated Dialysis Coordinator Estill Bamberg.     Expected Discharge Plan: Skilled Nursing Facility Barriers to Discharge: Continued Medical Work up,Homeless with medical needs  Expected Discharge Plan and Services Expected Discharge Plan: [place] In-house Referral: Clinical Social Work Discharge Planning Services: Homebound not met per provider Post Acute Care Choice: [place] Living arrangements for the past 2 months: No permanent address (Lived with Sister and family)                 DME Arranged: N/A DME Agency: NA       HH Arranged: NA HH Agency: NA         Social Determinants of Health (SDOH) Interventions    Readmission Risk Interventions No flowsheet data found.

## 2020-06-29 NOTE — TOC Progression Note (Signed)
Transition of Care [facility]) - Progression Note    Patient Details  Name: Katie [name] MRN: [id number] Date of Birth: [birth year]  Transition of Care [facility]) CM/SW [place], RN Phone Number: 06/29/2020, 1:42 PM  Clinical Narrative:     Outreach to Estill Bamberg, Dialysis Navigator to confirm patient has HD chair at discharge. Patient has no bed offers for SNF placement and has received PT in hospital for several weeks. Will arrange discharge home with [facility] for follow up once OP HD chair confirmed.    Expected Discharge Plan: Skilled Nursing Facility Barriers to Discharge: Continued Medical Work up,Homeless with medical needs  Expected Discharge Plan and Services Expected Discharge Plan: [place] In-house Referral: Clinical Social Work Discharge Planning Services: Homebound not met per provider Post Acute Care Choice: [place] Living arrangements for the past 2 months: No permanent address (Lived with Sister and family)                 DME Arranged: N/A DME Agency: NA       HH Arranged: NA HH Agency: NA         Social Determinants of Health (SDOH) Interventions    Readmission Risk Interventions No flowsheet data found.

## 2020-06-29 NOTE — Progress Notes (Signed)
Occupational Therapy Treatment Patient Details Name: Katie [name] MRN: [id number] DOB: 06-04-1955 Today's Date: 06/29/2020    History of present illness Patient is a 65 y.o. female with a history of diabetes mellitus type 2, stage IV CKD, heart failure, hypertension. Patient presented secondary to dyspnea with evidence of volume overload and pleural effusion. Diagnosed with moderate pericardial effusion, mild to moderate aortic insufficiency and aortic stenosis.  S/p R heart cath 12/28.  R temp fem cath placed 12/29; 1/3 R IJ permcath placed; 1/4 R temp fem cath removed.   OT comments  Katie [name] continues to present with headache and upper back pain, generalized weakness, reduced endurance, dizziness in sitting and standing. Although she requires increased time and effort, she is able to perform bed mobility and transfers without physical assistance. LB dressing remains a challenge for Katie [name], due to limited flexibility and fear of falling; she is able to perform more independently with AE. She can perform UB dressing and grooming from a seated position with increased time. Katie [name] pleasant, engaged, and working hard throughout session. Recommend ongoing OT while hospitalized. This therapist continues to believe that Katie [name] would benefit from STR, although understand that this may not be possible for Katie [name] financially. If Katie [name] moves from hospital to her friends' home, recommend [place] and as well as other home-based rehab and health care services.   Follow Up Recommendations  SNF;Supervision - Intermittent    Equipment Recommendations  3 in 1 bedside commode;Wheelchair (measurements OT);Wheelchair cushion (measurements OT)    Recommendations for Other Services      Precautions / Restrictions Precautions Precautions: Fall Precaution Comments: R IJ permcath; monitor orthostatics Restrictions Weight Bearing Restrictions: No Other Position/Activity Restrictions: Katie [name] Orthostatic, monitor BP with change of  position       Mobility Bed Mobility Overal bed mobility: Modified Independent Bed Mobility: Supine to Sit Rolling: Modified independent (Device/Increase time)   Supine to sit: Modified independent (Device/Increase time) Sit to supine: Modified independent (Device/Increase time)   General bed mobility comments: Katie [name] sat EOB x several minutes prior to getting OOB. BP in supine prior to applying TEDs 111/64  Transfers Overall transfer level: Needs assistance Equipment used: Rolling walker (2 wheeled) Transfers: Sit to/from Stand Sit to Stand: Supervision         General transfer comment: close supervision for safety, but Katie [name] able to come into standing with no physical assist from therapist. Reports increased dizziness with standing.    Balance Overall balance assessment: Needs assistance Sitting-balance support: Feet supported Sitting balance-Leahy Scale: Good Sitting balance - Comments: good balance, although Katie [name] endorses dizziness   Standing balance support: Bilateral upper extremity supported Standing balance-Leahy Scale: Fair Standing balance comment: no LOB with BUE support                           ADL either performed or assessed with clinical judgement   ADL Overall ADL's : Needs assistance/impaired Eating/Feeding: Set up;Supervision/ safety               Upper Body Dressing : Set up;Min guard;Sitting   Lower Body Dressing: Moderate assistance;Maximal assistance Lower Body Dressing Details (indicate cue type and reason): from sitting position, unable to bend to don socks or pants, cannot bend knee enough to don/doff socks. Max A without AE, able to assist much more in LB dressing when using sock aid and reacher.               General  ADL Comments: Katie [name] endorses dizziness during functional mobility tasks     Vision Baseline Vision/History: Wears glasses;Retinopathy Wears Glasses: At all times     Perception     Praxis      Cognition  Arousal/Alertness: Awake/alert Behavior During Therapy: WFL for tasks assessed/performed Overall Cognitive Status: Within Functional Limits for tasks assessed                                 General Comments: Katie [name] A and O x4. Katie [name] was pleasant throughout session        Exercises Other Exercises Other Exercises: UB and LB dressing, grooming, bed mobility, sit<>stand transfers, educ re: use of AE, therex while seated EOB   Shoulder Instructions       General Comments      Pertinent Vitals/ Pain       Pain Assessment: 0-10 Pain Score: 7  Pain Location: headache, chronic neck and upper back pain Pain Intervention(s): Limited activity within patient's tolerance;Monitored during session  Home Living                                          Prior Functioning/Environment              Frequency  Min 2X/week        Progress Toward Goals  OT Goals(current goals can now be found in the care plan section)  Progress towards OT goals: Progressing toward goals  Acute Rehab OT Goals Patient Stated Goal: to go home (friends house) when safe OT Goal Formulation: With patient Time For Goal Achievement: 07/05/20 Potential to Achieve Goals: Good  Plan Discharge plan remains appropriate;Frequency remains appropriate    Co-evaluation                 AM-PAC OT "6 Clicks" Daily Activity     Outcome Measure   Help from another person eating meals?: None Help from another person taking care of personal grooming?: A Little Help from another person toileting, which includes using toliet, bedpan, or urinal?: A Lot Help from another person bathing (including washing, rinsing, drying)?: A Lot Help from another person to put on and taking off regular upper body clothing?: A Little Help from another person to put on and taking off regular lower body clothing?: A Lot 6 Click Score: 16    End of Session Equipment Utilized During Treatment: Rolling  walker  OT Visit Diagnosis: Unsteadiness on feet (R26.81);History of falling (Z91.81);Pain;Muscle weakness (generalized) (M62.81)   Activity Tolerance Patient tolerated treatment well   Patient Left in bed;with call bell/phone within reach;with bed alarm set   Nurse Communication          Time: [id number] OT Time Calculation (min): 24 min  Charges: OT General Charges $OT Visit: 1 Visit OT Treatments $Self Care/Home Management : 8-22 mins $Therapeutic Activity: 8-22 mins  Josiah Lobo, PhD, [place], OTR/L ascom [phone] 06/29/20, 5:09 PM

## 2020-06-29 NOTE — Progress Notes (Signed)
Physical Therapy Treatment Patient Details Name: Katie [name] MRN: [id number] DOB: 10/18/1955 Today's Date: 06/29/2020    History of Present Illness Patient is a 65 y.o. female with a history of diabetes mellitus type 2, stage IV CKD, heart failure, hypertension. Patient presented secondary to dyspnea with evidence of volume overload and pleural effusion. Diagnosed with moderate pericardial effusion, mild to moderate aortic insufficiency and aortic stenosis.  S/p R heart cath 12/28.  R temp fem cath placed 12/29; 1/3 R IJ permcath placed; 1/4 R temp fem cath removed.    PT Comments    Pt was long sitting in bed upon arriving. Resting BP prior to applying TED stockings 111/64. Needed total assist to apply TEDs. She was able to exit L side of bed without assistance, ambulate 2 x in hallway with RW prior to c/o fatigue and lightheadedness. Furthest ambulation was 100 ft. BP once returned to room, 97/58 but symptoms resolve with return to supine. Pt continues to have orthostatic hypotensive issues but it does seem to be improving. Pt is on HD and will need to be able to go to OP HD 2 x a week. Highly recommend SNF however pt is a difficulty placement. Acute PT will continue to follow and progress as able per POC.    Follow Up Recommendations  Supervision for mobility/OOB;SNF;Other (comment) (pt is planning to DC to friends house)     Financial risk analyst (measurements PT);Wheelchair cushion (measurements PT);Rolling walker with 5" wheels;3in1 (PT)    Recommendations for Other Services       Precautions / Restrictions Precautions Precautions: Fall Precaution Comments: R IJ permcath; monitor orthostatics Restrictions Weight Bearing Restrictions: No    Mobility  Bed Mobility Overal bed mobility: Modified Independent Bed Mobility: Supine to Sit Rolling: Modified independent (Device/Increase time)   Supine to sit: Modified independent (Device/Increase time) Sit to  supine: Modified independent (Device/Increase time)   General bed mobility comments: pt sat EOB x several minutes prior to getting OOB. BP in supine prior to applying TEDs 111/64    Transfers Overall transfer level: Needs assistance Equipment used: Rolling walker (2 wheeled) Transfers: Sit to/from Stand Sit to Stand: Supervision         General transfer comment: pt struggled on first attempt to stand however was able to stand from slightly elevated bed height with supervision only after.  Ambulation/Gait Ambulation/Gait assistance: Min guard Gait Distance (Feet): 100 Feet Assistive device: Rolling walker (2 wheeled) Gait Pattern/deviations: Step-through pattern;Decreased stride length Gait velocity: slightly decreased   General Gait Details: Pt was able to tolerate ambulation 2 x with RW + w/c follow. first trial ~ 70 ft then 2nd trial 133f.       Balance Overall balance assessment: Needs assistance Sitting-balance support: Feet supported Sitting balance-Leahy Scale: Good     Standing balance support: Bilateral upper extremity supported;During functional activity Standing balance-Leahy Scale: Fair Standing balance comment: no LOB with BUE support         Cognition Arousal/Alertness: Awake/alert Behavior During Therapy: WFL for tasks assessed/performed Overall Cognitive Status: Within Functional Limits for tasks assessed    General Comments: Pt A and O x4. Pt was pleasant throughout session             Pertinent Vitals/Pain Pain Assessment: No/denies pain Pain Score: 0-No pain Pain Intervention(s): Limited activity within patient's tolerance;Monitored during session;Repositioned;Patient requesting pain meds-RN notified (requested tylonol after session 2/2 to neck pain)           PT Goals (  current goals can now be found in the care plan section) Acute Rehab PT Goals Patient Stated Goal: to go home (friends house) when safe Progress towards PT goals:  Progressing toward goals    Frequency    Min 2X/week      PT Plan Current plan remains appropriate    AM-PAC PT "6 Clicks" Mobility   Outcome Measure  Help needed turning from your back to your side while in a flat bed without using bedrails?: None Help needed moving from lying on your back to sitting on the side of a flat bed without using bedrails?: A Little Help needed moving to and from a bed to a chair (including a wheelchair)?: A Little Help needed standing up from a chair using your arms (e.g., wheelchair or bedside chair)?: A Little Help needed to walk in hospital room?: A Little Help needed climbing 3-5 steps with a railing? : A Little 6 Click Score: 19    End of Session Equipment Utilized During Treatment: Gait belt;Other (comment) (TED stockings) Activity Tolerance: Patient tolerated treatment well Patient left: in bed;with call bell/phone within reach;with bed alarm set Nurse Communication: Mobility status PT Visit Diagnosis: Unsteadiness on feet (R26.81);Muscle weakness (generalized) (M62.81);History of falling (Z91.81);Other abnormalities of gait and mobility (R26.89)     Time: [id number] PT Time Calculation (min) (ACUTE ONLY): 27 min  Charges:  $Gait Training: 8-22 mins $Therapeutic Activity: 8-22 mins                     Julaine Fusi PTA 06/29/20, 3:56 PM

## 2020-06-29 NOTE — Progress Notes (Signed)
[facility]  ROUNDING NOTE   Subjective:   Patient seen while lying in bed States the headache is improved  She seems pleased with the possibility if being discharged soon Says a friend has offered to let her stay in her home. She says she believes to be setup for outpatient dialysis    Objective:  Vital signs in last 24 hours:  Temp:  [97.5 F (36.4 C)-98.5 F (36.9 C)] 98.5 F (36.9 C) (02/16 1113) Pulse Rate:  [64-67] 64 (02/16 1113) Resp:  [14-20] 16 (02/16 1113) BP: (145-172)/(62-78) 172/75 (02/16 1113) SpO2:  [98 %-99 %] 98 % (02/16 1113)  Weight change:  Filed Weights   06/25/20 0500 06/26/20 0500 06/27/20 0500  Weight: 71.6 kg 72.4 kg 72.2 kg    Intake/Output: I/O last 3 completed shifts: In: -  Out: 425 [Urine:425]   Intake/Output this shift:  No intake/output data recorded.  Physical Exam: General: NAD, resting in bed  Head: Normocephalic, atraumatic. Moist oral mucosal membranes  Eyes: Anicteric  Neck: Supple, trachea midline  Lungs:  Clear bilaterally  Heart: Regular rate and rhythm  Abdomen:  Soft, nontender,   Extremities:  trace peripheral edema.  Neurologic: Nonfocal, moving all four extremities  Skin: No lesions  Access: Right IJ Permcath     Basic Metabolic Panel: Recent Labs  Lab 06/24/20 0500 06/27/20 0909 06/28/20 0542  NA 136 137 138  K 4.5 2.9* 3.3*  CL 104 103 103  CO2 '24 26 27  '$ GLUCOSE 112* 94 94  BUN 51* 25* 39*  CREATININE 3.00* 1.39* 2.22*  CALCIUM 8.2* 8.0* 8.2*  MG 2.3  --  2.2  PHOS 6.1* 2.7  --     Liver Function Tests: Recent Labs  Lab 06/27/20 0909  ALBUMIN 2.7*   No results for input(s): LIPASE, AMYLASE in the last 168 hours. No results for input(s): AMMONIA in the last 168 hours.  CBC: Recent Labs  Lab 06/24/20 0500 06/27/20 0909 06/28/20 0542  WBC 6.0 5.8 5.4  HGB 9.0* 10.6* 10.2*  HCT 27.1* 31.8* 31.8*  MCV 95.1 94.4 96.1  PLT 265 276 274    Cardiac Enzymes: No results for  input(s): CKTOTAL, CKMB, CKMBINDEX, TROPONINI in the last 168 hours.  BNP: Invalid input(s): POCBNP  CBG: Recent Labs  Lab 06/23/20 1153 06/23/20 1616 06/23/20 2014 06/26/20 0908  GLUCAP 120* 129* 102* 84    Microbiology: Results for orders placed or performed during the hospital encounter of 04/30/20  Resp Panel by RT-PCR (Flu A&B, Covid) Nasopharyngeal Swab     Status: None   Collection Time: 04/30/20  4:01 PM   Specimen: Nasopharyngeal Swab; Nasopharyngeal(NP) swabs in vial transport medium  Result Value Ref Range Status   SARS Coronavirus 2 by RT PCR NEGATIVE NEGATIVE Final    Comment: (NOTE) SARS-CoV-2 target nucleic acids are NOT DETECTED.  The SARS-CoV-2 RNA is generally detectable in upper respiratory specimens during the acute phase of infection. The lowest concentration of SARS-CoV-2 viral copies this assay can detect is 138 copies/mL. A negative result does not preclude SARS-Cov-2 infection and should not be used as the sole basis for treatment or other patient management decisions. A negative result may occur with  improper specimen collection/handling, submission of specimen other than nasopharyngeal swab, presence of viral mutation(s) within the areas targeted by this assay, and inadequate number of viral copies(<138 copies/mL). A negative result must be combined with clinical observations, patient history, and epidemiological information. The expected result is Negative.  Fact  Sheet for Patients:  EntrepreneurPulse.com.au  Fact Sheet for Healthcare Providers:  IncredibleEmployment.be  This test is no t yet approved or cleared by the [place] FDA and  has been authorized for detection and/or diagnosis of SARS-CoV-2 by FDA under an Emergency Use Authorization (EUA). This EUA will remain  in effect (meaning this test can be used) for the duration of the COVID-19 declaration under Section 564(b)(1) of the Act,  21 U.S.C.section 360bbb-3(b)(1), unless the authorization is terminated  or revoked sooner.       Influenza A by PCR NEGATIVE NEGATIVE Final   Influenza B by PCR NEGATIVE NEGATIVE Final    Comment: (NOTE) The Xpert Xpress SARS-CoV-2/FLU/RSV plus assay is intended as an aid in the diagnosis of influenza from Nasopharyngeal swab specimens and should not be used as a sole basis for treatment. Nasal washings and aspirates are unacceptable for Xpert Xpress SARS-CoV-2/FLU/RSV testing.  Fact Sheet for Patients: EntrepreneurPulse.com.au  Fact Sheet for Healthcare Providers: IncredibleEmployment.be  This test is not yet approved or cleared by the [place] FDA and has been authorized for detection and/or diagnosis of SARS-CoV-2 by FDA under an Emergency Use Authorization (EUA). This EUA will remain in effect (meaning this test can be used) for the duration of the COVID-19 declaration under Section 564(b)(1) of the Act, 21 U.S.C. section 360bbb-3(b)(1), unless the authorization is terminated or revoked.  Performed at [facility], [address] [place], [place] [address]   Body fluid culture     Status: None   Collection Time: 05/02/20  4:44 PM   Specimen: PATH Cytology Pleural fluid  Result Value Ref Range Status   Specimen Description   Final    PLEURAL Performed at [facility], [address] [place], [place] [address]    Special Requests   Final    NONE Performed at [facility], [address] [place], [place] [address]    Gram Stain   Final    MODERATE WBC PRESENT, PREDOMINANTLY MONONUCLEAR NO ORGANISMS SEEN    Culture   Final    NO GROWTH Performed at [facility], [place] [address] [place], [place] [address]    Report Status 05/06/2020 FINAL  Final  MRSA PCR Screening     Status: None   Collection Time: 05/16/20  9:29 PM   Specimen: Nasal Mucosa; Nasopharyngeal  Result Value Ref Range  Status   MRSA by PCR NEGATIVE NEGATIVE Final    Comment:        The GeneXpert MRSA Assay (FDA approved for NASAL specimens only), is one component of a comprehensive MRSA colonization surveillance program. It is not intended to diagnose MRSA infection nor to guide or monitor treatment for MRSA infections. Performed at [facility], [address] [place], [place] [address]     Coagulation Studies: No results for input(s): LABPROT, INR in the last 72 hours.  Urinalysis: No results for input(s): COLORURINE, LABSPEC, PHURINE, GLUCOSEU, HGBUR, BILIRUBINUR, KETONESUR, PROTEINUR, UROBILINOGEN, NITRITE, LEUKOCYTESUR in the last 72 hours.  Invalid input(s): APPERANCEUR    Imaging: No results found.   Medications:   . sodium chloride    . albumin human 25 g (06/20/20 1143)   . carvedilol  25 mg Oral BID WC  . Chlorhexidine Gluconate Cloth  6 each Topical Q0600  . cholecalciferol  1,000 Units Oral Daily  . cloNIDine  0.1 mg Transdermal Weekly  . divalproex  500 mg Oral QHS  . epoetin (EPOGEN/PROCRIT) injection  4,000 Units  Intravenous Q M,W,F-HD  . feeding supplement  237 mL Oral BID BM  . heparin  5,000 Units Subcutaneous Q8H  . lidocaine  1 patch Transdermal Q24H  . melatonin  5 mg Oral QPM  . multivitamin  1 tablet Oral QHS  . polyethylene glycol  17 g Oral Daily  . senna-docusate  1 tablet Oral BID  . sevelamer carbonate  800 mg Oral TID WC  . sodium chloride flush  3 mL Intravenous Q12H  . topiramate  50 mg Oral BID  . torsemide  10 mg Oral Daily  . traZODone  50 mg Oral QPM  . venlafaxine  37.5 mg Oral BID WC  . verapamil  240 mg Oral QHS  . vitamin B-12  100 mcg Oral Daily   sodium chloride, acetaminophen **OR** acetaminophen, ALPRAZolam, benzonatate, bisacodyl, bisacodyl, diphenhydrAMINE, ondansetron **OR** ondansetron (ZOFRAN) IV, prochlorperazine, sodium chloride flush, [COMPLETED] tiZANidine **FOLLOWED BY** tiZANidine  Assessment/ Plan:  Ms.  Katie [name] is a 65 y.o.  female female with diastolic congestive heart failure, HTN, DM type II, CVA was admitted on 04/30/20 for volume overload.   Patient moved from [place] in December 2021. She was following with nephrology there. Patient initiated on hemodialysis this admission. Patient has a prolonged hospitalization. She was initially admitted with volume overload and acute exacerbation of congestive heart failure. Failed IV diuretics and started on hemodialysis on 12/29. No outpatient dialysis due to patient's lack of insurance and not a resident of the state of [place]. Held dialysis for possible recovery however after several days, creatinine rising and volume accumulation. Patient then resumed on dialysis. She has been weaned down to twice a week  1. ESRD on Monday ,Friday dialysis  --Next schedule treatment on Friday -Current labs stable -discharge planning-collect updated Hep B studies, chest xray, and refer for outpatient treatment.  2. Hypertension with CKD  - BP  elevated  -current medications offer improved control  3. Secondary Hyperparathyroidism of renal origin   - Currently prescribed Renvela TID with meals  4. Anemia of CKD  -HGB currently 10.2. -EPO given during dialysis   LOS: [place], NP 2/16/202211:41 AM

## 2020-06-29 NOTE — Progress Notes (Signed)
PROGRESS NOTE    Katie [name]  [id number] DOB: 1956-04-01 DOA: 04/30/2020 PCP: Patient, [name]    Brief Narrative:   65 y.o.female with a history of diabetes mellitus type 2, stage IV CKD, heart failure, hypertension. Patient presented to the ED on 04/30/20 with complaints of worsening dyspnea with evidence of volume overload and pleural effusion.  Hospital stay has been prolonged due to disposition challenges and no safe discharge plan, due to lack of insurance difficulty with SNF placement in patient too profoundly weak to safely return home.    Acute medical issues have resolved and patient is medically stable for discharge once adequate plans are in place.  Senior case management involved and bed search has been expanded.  Ongoing issues are orthostatic hypotension and headaches.  Neurology consulted and patient on multiple agents for prevention and abortive therapy.  Now off opiates completely so hopeful overuse headache component continues to improve.      2/17-pt reports the friend who she is going to stay has the flu now. Also I spoke to nephrology Dr. Abigail Butts, pt does not have outpatient dialysis spot yet. Case mx reports medicaid application was done last week.     Consultants:   nephrology  Procedures: HD  Antimicrobials:       Subjective: Has no complaints , except that she tells me her friend has flu and doesn't want to be near her right night. She also reports she is trying to work with PT more this week as she had slacked off a bit.  Objective: Vitals:   06/29/20 0505 06/29/20 0721 06/29/20 1113 06/29/20 1528  BP: (!) 164/73 (!) 166/78 (!) 172/75 (!) 146/71  Pulse: 64 65 64 66  Resp: '18 16 16 16  '$ Temp: 98 F (36.7 C) 98.1 F (36.7 C) 98.5 F (36.9 C) 98.3 F (36.8 C)  TempSrc:      SpO2: 99% 98% 98% 99%  Weight:      Height:        Intake/Output Summary (Last 24 hours) at 06/29/2020 1532 Last data filed at 06/28/2020 1818 Gross per 24  hour  Intake --  Output 425 ml  Net -425 ml   Filed Weights   06/25/20 0500 06/26/20 0500 06/27/20 0500  Weight: 71.6 kg 72.4 kg 72.2 kg    Examination:  General exam: Appears calm and comfortable  Respiratory system: Clear to auscultation. Respiratory effort normal. Cardiovascular system: S1 & S2 heard, RRR. No JVD, murmurs, rubs, gallops or clicks. Gastrointestinal system: Abdomen is nondistended, soft and nontender. Normal bowel sounds heard. Central nervous system: Alert and oriented. grossly intact Extremities: no edema Psychiatry:  Mood & affect appropriate.     Data Reviewed: I have personally reviewed following labs and imaging studies  CBC: Recent Labs  Lab 06/24/20 0500 06/27/20 0909 06/28/20 0542  WBC 6.0 5.8 5.4  HGB 9.0* 10.6* 10.2*  HCT 27.1* 31.8* 31.8*  MCV 95.1 94.4 96.1  PLT 265 276 [id number]   Basic Metabolic Panel: Recent Labs  Lab 06/24/20 0500 06/27/20 0909 06/28/20 0542  NA 136 137 138  K 4.5 2.9* 3.3*  CL 104 103 103  CO2 '24 26 27  '$ GLUCOSE 112* 94 94  BUN 51* 25* 39*  CREATININE 3.00* 1.39* 2.22*  CALCIUM 8.2* 8.0* 8.2*  MG 2.3  --  2.2  PHOS 6.1* 2.7  --    GFR: Estimated Creatinine Clearance: 26.8 mL/min (A) (by C-G formula based on SCr of 2.22 mg/dL (H)). Liver Function  Tests: Recent Labs  Lab 06/27/20 0909  ALBUMIN 2.7*   No results for input(s): LIPASE, AMYLASE in the last 168 hours. No results for input(s): AMMONIA in the last 168 hours. Coagulation Profile: No results for input(s): INR, PROTIME in the last 168 hours. Cardiac Enzymes: No results for input(s): CKTOTAL, CKMB, CKMBINDEX, TROPONINI in the last 168 hours. BNP (last 3 results) No results for input(s): PROBNP in the last 8760 hours. HbA1C: No results for input(s): HGBA1C in the last 72 hours. CBG: Recent Labs  Lab 06/23/20 1153 06/23/20 1616 06/23/20 2014 06/26/20 0908  GLUCAP 120* 129* 102* 84   Lipid Profile: No results for input(s): CHOL, HDL,  LDLCALC, TRIG, CHOLHDL, LDLDIRECT in the last 72 hours. Thyroid Function Tests: No results for input(s): TSH, T4TOTAL, FREET4, T3FREE, THYROIDAB in the last 72 hours. Anemia Panel: No results for input(s): VITAMINB12, FOLATE, FERRITIN, TIBC, IRON, RETICCTPCT in the last 72 hours. Sepsis Labs: No results for input(s): PROCALCITON, LATICACIDVEN in the last 168 hours.  No results found for this or any previous visit (from the past 240 hour(s)).       Radiology Studies: DG Chest 1 View  Result Date: 06/29/2020 CLINICAL DATA:  Weakness. EXAM: CHEST  1 VIEW COMPARISON:  May 08, 2020. FINDINGS: Stable cardiomegaly. Mild central pulmonary vascular congestion is noted. Interval placement of right internal jugular dialysis catheter with distal tip in expected position of cavoatrial junction. No pneumothorax is noted. Bibasilar atelectasis and pleural effusions are noted, right greater than left. Bony thorax is unremarkable. IMPRESSION: Bibasilar atelectasis and pleural effusions, right greater than left. Mild central pulmonary vascular congestion is noted. Electronically Signed   By: Marijo Conception M.D.   On: 06/29/2020 14:17        Scheduled Meds: . carvedilol  25 mg Oral BID WC  . Chlorhexidine Gluconate Cloth  6 each Topical Q0600  . cholecalciferol  1,000 Units Oral Daily  . cloNIDine  0.1 mg Transdermal Weekly  . divalproex  500 mg Oral QHS  . epoetin (EPOGEN/PROCRIT) injection  4,000 Units Intravenous Q M,W,F-HD  . feeding supplement  237 mL Oral BID BM  . heparin  5,000 Units Subcutaneous Q8H  . lidocaine  1 patch Transdermal Q24H  . melatonin  5 mg Oral QPM  . multivitamin  1 tablet Oral QHS  . polyethylene glycol  17 g Oral Daily  . senna-docusate  1 tablet Oral BID  . sevelamer carbonate  800 mg Oral TID WC  . sodium chloride flush  3 mL Intravenous Q12H  . topiramate  50 mg Oral BID  . torsemide  10 mg Oral Daily  . traZODone  50 mg Oral QPM  . venlafaxine  37.5 mg  Oral BID WC  . verapamil  240 mg Oral QHS  . vitamin B-12  100 mcg Oral Daily   Continuous Infusions: . sodium chloride    . albumin human 25 g (06/20/20 1143)    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)   Hyperkalemia   Pressure injury of skin   Pleural effusion   Acute heart failure with preserved ejection fraction (HFpEF) (HCC)   Chronic kidney disease (CKD), stage IV (severe) (HCC)   Pulmonary hypertension, unspecified (HCC)   ATN (acute tubular necrosis) (HCC)   Persistent headaches  Headache, persistent / History of chronic migraine headaches / Medication Overuse Headaches -patient reported using Imitrex as needed.  Has been transitioned entirely off opioids.   MRI brain, MRA, MRV were unremarkable --Neurology consulted, recommendations: --d/c Imitrex (has overused) --continue prophylactics: venlafaxine, Topamax  --continue Depakote taper & tizanidine to help wean off opiates --clonidine patch for more even BP control  --continue verapamil Tylenol prn Outpatient neurology follow-up for nerve blocks, Botox, CGRP inhibitor consideration    Acute on chronic diastolic CHF -echo in December 2021 showed normal EF and grade 2 diastolic dysfunction, associated RV systolic dysfunction and elevated PA pressure seen on TTE however right heart cath on 12/28 significantly for mildly elevated pulmonary artery pressures. Presented volume overloaded, initially managed with IV Lasix but had worsening renal function.  Unfortunately her CKD has progressed to ESRD and now on dialysis.   Continue dialysis per nephrology Continue torsemide 10 mg daily Daily weight   ESRD -unknown baseline renal function.  Patient unsure primary nephrologist name in [place]. Presented with creatinine 2.97.  Patient reported plan  was to start dialysis and consideration of future renal transplant.  Renal function worsened over course of admission secondary to diuresis for acute CHF and volume overload.  She has been started on dialysis.   Anticipate outpatient dialysis will be [place]. 2/17-per nephrology, pt currently does not have a spot yet, awaiting insurance establishment. Case mx aware.  Orthostatic hypotension -likely secondary to autonomic dysfunction.  Has baseline elevated blood pressures.  Was tried on midodrine which had to be stopped due to systolic pressures severely elevated 190s to 200s. --TED hose and abdominal binder when oob/mobility --2/26-continue PT  Ck orthostatics periordically continue midodrine   Pleural effusion, right side -likely due to volume overload in the setting of diastolic dysfunction and renal failure.  Status post right thoracentesis on 12/20.  No evidence of malignancy was seen on cytology.  Monitor.      Pericardial effusion, moderate -seen on echo, no evidence of tamponade.  No recent infections to attribute this to.  In the setting of CKD 5 and elevated BUN, likely secondary to uremia.  Patient has no chest pain.  Cardiology recommended outpatient echo for surveillance.   Essential hypertension - with labile BP's, orthostatic hypotension. PTA was on multiple agents including Coreg, Cozaar, torsemide. Had been tried on midodrine for orthostasis but stopped due to severely elevated pressures.   Losartan discontinued and started on clonidine by nephrology.   --Continue Coreg 25 mg mg BID (also helps HA's) --Continue clonidine 0.1 mg patch every 7 days --Continue torsemide 10 mg daily --Also on verapamil for HA's as above --May require additional agent   Anemia of chronic renal disease -with macrocytosis on CBC.  TIBC low but otherwise normal iron studies.  Vitamin B12 level 358.  Continue EPO with dialysis per renal.  Monitor CBC.   History of  type 2 diabetes -last A1c 4.7%, not currently diabetic and CBGs well controlled.     History of tobacco abuse -quit at least 10 years ago, prior 20-pack-year history.   Insomnia -continue melatonin and trazodone.  No vitals overnight when patient sleeping, and minimize interruptions in sleep.   Pressure injury, mid coccyx -present on admission. Pressure Injury 04/30/20 Coccyx Mid Stage 2 -  Partial thickness loss of dermis presenting as a shallow open injury with a red, pink wound bed without slough. (Active)  04/30/20 2130  Location: Coccyx  Location Orientation: Mid  Staging: Stage 2 -  Partial  thickness loss of dermis presenting as a shallow open injury with a red, pink wound bed without slough.  Wound Description (Comments):   Present on Admission: Yes       DVT prophylaxis: heparin Code Status: Full Family Communication: None at bedside  Status is: Inpatient  Remains inpatient appropriate because:Unsafe d/c plan   Dispo: The patient is from: Home              Anticipated d/c is to: Home vs SNF              Anticipated d/c date is: TBD-awaiting HD spot as outpt              Patient currently is medically stable to d/c.awaiting HD outpt spot. Possible going home to friends house but friend with flu.   Difficult to place patient :yes            LOS: 59 days   Time spent: 35 min with >50% on coc    Nolberto Hanlon, MD [facility] Pager [phone]  If 7PM-7AM, please contact night-coverage 06/29/2020, 3:32 PM

## 2020-06-30 NOTE — Progress Notes (Signed)
PT Cancellation Note  Patient Details Name: Katie [name] MRN: [id number] DOB: [birth year]   Cancelled Treatment:     PT attempt. 2nd attempt today. Pt asleep upon arriving with untouched lunch tray at bedside. Pt unaware lunch tray arrived. She requested to eat lunch prior to PT session. Acute PT will continue to follow per POC.    Willette Pa 06/30/2020, 1:28 PM

## 2020-06-30 NOTE — TOC Progression Note (Addendum)
Transition of Care [facility]) - Progression Note    Patient Details  Name: Irena Gaydos MRN: [id number] Date of Birth: February 06, 1956  [facility]) CM/SW [place], LCSW Phone Number: 06/30/2020, 10:30 AM  Clinical Narrative:       CSW collaborated with Care Team and updated patient regarding discharge planning. Patient will be living with friend Howard Pouch at [address] [place], [name]. Patient's direct # is [phone].  Dialysis- [facility] working on dialysis placement. Patient reports her friend will provide transportation to and from dialysis while the Dialysis Center works on setting up transportation services for her after discharge.  Home Health- reached out to [facility] with Alvis Lemmings for charity home health referral, confirmed home address in chart.     DME- PT and OT recommending w/c, w/c cushion, 3 in 1, RW, grabber, sock aid, and bed pan as well as Home Health. Ordered charity w/c, cushion, 3 in 1, and RW through [employer]. Patient reported she is able to afford to privately order a sock aid and grabber. Can send bed pan home from hospital when discharged.   Transportation- patient reported she will need Nonemergent EMS home so that they can help her up the 3 stairs into her home. Patient says she thinks she will be able to maneuver the stairs with assistance from roommate. Informed her of option to reach out to local churches for assistance with ramp if needed.  Patient asking if she can wait until her roommate recovers from the flu before discharging, explained this is not up to [place], informed MD/TOC Supervisor.  Will continue to follow.  Expected Discharge Plan: Skilled Nursing Facility Barriers to Discharge: Continued Medical Work up,Homeless with medical needs  Expected Discharge Plan and Services Expected Discharge Plan: [place] In-house Referral: Clinical Social Work Discharge Planning Services: Homebound not met per  provider Post Acute Care Choice: [place] Living arrangements for the past 2 months: No permanent address (Lived with Sister and family)                 DME Arranged: N/A DME Agency: NA       HH Arranged: NA HH Agency: NA         Social Determinants of Health (SDOH) Interventions    Readmission Risk Interventions No flowsheet data found.

## 2020-06-30 NOTE — Progress Notes (Signed)
   06/24/20 1145 06/24/20 1200 06/24/20 1215  Vitals  BP 118/73 118/72 111/68  MAP (mmHg) 84 85 81  Pulse Rate 68 69 68  ECG Heart Rate 69 70 69  Resp '13 14 13  '$ During Hemodialysis Assessment  Blood Flow Rate (mL/min) 300 mL/min 300 mL/min 300 mL/min  Arterial Pressure (mmHg) -100 mmHg -100 mmHg -100 mmHg  Venous Pressure (mmHg) 100 mmHg 100 mmHg 100 mmHg  Transmembrane Pressure (mmHg) 50 mmHg 50 mmHg 50 mmHg  Ultrafiltration Rate (mL/min) 840 mL/min 840 mL/min 840 mL/min  Dialysate Flow Rate (mL/min) 600 ml/min 600 ml/min 600 ml/min  Conductivity: Machine  '14 14 14  '$ HD Safety Checks Performed Yes Yes Yes  Intra-Hemodialysis Comments Progressing as prescribed Progressing as prescribed Progressing as prescribed    06/24/20 1230 06/24/20 1245 06/24/20 1300  Vitals  BP 117/69 119/69 139/70  MAP (mmHg) 83 82 90  Pulse Rate 69 69 71  ECG Heart Rate 69 70 71  Resp '13 15 14  '$ During Hemodialysis Assessment  Blood Flow Rate (mL/min) 300 mL/min 300 mL/min 100 mL/min  Arterial Pressure (mmHg) -100 mmHg -110 mmHg -80 mmHg  Venous Pressure (mmHg) 240 mmHg 240 mmHg 70 mmHg  Transmembrane Pressure (mmHg) 50 mmHg 50 mmHg 20 mmHg  Ultrafiltration Rate (mL/min) 840 mL/min 840 mL/min 70 mL/min  Dialysate Flow Rate (mL/min) 600 ml/min 600 ml/min 600 ml/min  Conductivity: Machine  '14 14 14  '$ HD Safety Checks Performed Yes Yes Yes  Intra-Hemodialysis Comments Progressing as prescribed Progressing as prescribed Tx completed

## 2020-06-30 NOTE — Progress Notes (Signed)
[facility]  ROUNDING NOTE   Subjective:   Patient seen laying in bed talking on telephone Alert and oriented Denies shortness of breath and chest pain She states headache is better today Denies nausea with meals  Objective:  Vital signs in last 24 hours:  Temp:  [97.7 F (36.5 C)-98.3 F (36.8 C)] 97.8 F (36.6 C) (02/17 1144) Pulse Rate:  [62-73] 62 (02/17 1144) Resp:  [15-18] 18 (02/17 1144) BP: (125-163)/(63-71) 125/67 (02/17 1144) SpO2:  [96 %-99 %] 98 % (02/17 1144) Weight:  [72.9 kg] 72.9 kg (02/17 0500)  Weight change:  Filed Weights   06/26/20 0500 06/27/20 0500 06/30/20 0500  Weight: 72.4 kg 72.2 kg 72.9 kg    Intake/Output: I/O last 3 completed shifts: In: 240 [P.O.:240] Out: 900 [Urine:900]   Intake/Output this shift:  No intake/output data recorded.  Physical Exam: General: NAD, resting in bed  Head: Normocephalic, atraumatic. Moist oral mucosal membranes  Eyes: Anicteric  Neck: Supple, trachea midline  Lungs:  Clear bilaterally  Heart: Regular rate and rhythm  Abdomen:  Soft, nontender,   Extremities:  no peripheral edema.  Neurologic: Nonfocal, moving all four extremities  Skin: No lesions  Access: Right IJ Permcath     Basic Metabolic Panel: Recent Labs  Lab 06/24/20 0500 06/27/20 0909 06/28/20 0542  NA 136 137 138  K 4.5 2.9* 3.3*  CL 104 103 103  CO2 '24 26 27  '$ GLUCOSE 112* 94 94  BUN 51* 25* 39*  CREATININE 3.00* 1.39* 2.22*  CALCIUM 8.2* 8.0* 8.2*  MG 2.3  --  2.2  PHOS 6.1* 2.7  --     Liver Function Tests: Recent Labs  Lab 06/27/20 0909  ALBUMIN 2.7*   No results for input(s): LIPASE, AMYLASE in the last 168 hours. No results for input(s): AMMONIA in the last 168 hours.  CBC: Recent Labs  Lab 06/24/20 0500 06/27/20 0909 06/28/20 0542  WBC 6.0 5.8 5.4  HGB 9.0* 10.6* 10.2*  HCT 27.1* 31.8* 31.8*  MCV 95.1 94.4 96.1  PLT 265 276 274    Cardiac Enzymes: No results for input(s): CKTOTAL, CKMB,  CKMBINDEX, TROPONINI in the last 168 hours.  BNP: Invalid input(s): POCBNP  CBG: Recent Labs  Lab 06/23/20 1616 06/23/20 2014 06/26/20 0908  GLUCAP 129* 102* 84    Microbiology: Results for orders placed or performed during the hospital encounter of 04/30/20  Resp Panel by RT-PCR (Flu A&B, Covid) Nasopharyngeal Swab     Status: None   Collection Time: 04/30/20  4:01 PM   Specimen: Nasopharyngeal Swab; Nasopharyngeal(NP) swabs in vial transport medium  Result Value Ref Range Status   SARS Coronavirus 2 by RT PCR NEGATIVE NEGATIVE Final    Comment: (NOTE) SARS-CoV-2 target nucleic acids are NOT DETECTED.  The SARS-CoV-2 RNA is generally detectable in upper respiratory specimens during the acute phase of infection. The lowest concentration of SARS-CoV-2 viral copies this assay can detect is 138 copies/mL. A negative result does not preclude SARS-Cov-2 infection and should not be used as the sole basis for treatment or other patient management decisions. A negative result may occur with  improper specimen collection/handling, submission of specimen other than nasopharyngeal swab, presence of viral mutation(s) within the areas targeted by this assay, and inadequate number of viral copies(<138 copies/mL). A negative result must be combined with clinical observations, patient history, and epidemiological information. The expected result is Negative.  Fact Sheet for Patients:  EntrepreneurPulse.com.au  Fact Sheet for Healthcare Providers:  IncredibleEmployment.be  This test is no t yet approved or cleared by the [place] and  has been authorized for detection and/or diagnosis of SARS-CoV-2 by FDA under an Emergency Use Authorization (EUA). This EUA will remain  in effect (meaning this test can be used) for the duration of the COVID-19 declaration under Section 564(b)(1) of the Act, 21 U.S.C.section 360bbb-3(b)(1), unless the  authorization is terminated  or revoked sooner.       Influenza A by PCR NEGATIVE NEGATIVE Final   Influenza B by PCR NEGATIVE NEGATIVE Final    Comment: (NOTE) The Xpert Xpress SARS-CoV-2/FLU/RSV plus assay is intended as an aid in the diagnosis of influenza from Nasopharyngeal swab specimens and should not be used as a sole basis for treatment. Nasal washings and aspirates are unacceptable for Xpert Xpress SARS-CoV-2/FLU/RSV testing.  Fact Sheet for Patients: EntrepreneurPulse.com.au  Fact Sheet for Healthcare Providers: IncredibleEmployment.be  This test is not yet approved or cleared by the [place] FDA and has been authorized for detection and/or diagnosis of SARS-CoV-2 by FDA under an Emergency Use Authorization (EUA). This EUA will remain in effect (meaning this test can be used) for the duration of the COVID-19 declaration under Section 564(b)(1) of the Act, 21 U.S.C. section 360bbb-3(b)(1), unless the authorization is terminated or revoked.  Performed at [facility], [address] [place], [place] [address]   Body fluid culture     Status: None   Collection Time: 05/02/20  4:44 PM   Specimen: PATH Cytology Pleural fluid  Result Value Ref Range Status   Specimen Description   Final    PLEURAL Performed at [facility], [address], [place], [place] [address]    Special Requests   Final    NONE Performed at [facility], [address] [place], [place] [address]    Gram Stain   Final    MODERATE WBC PRESENT, PREDOMINANTLY MONONUCLEAR NO ORGANISMS SEEN    Culture   Final    NO GROWTH Performed at [facility], [place] [address], [place], [place] [address]    Report Status 05/06/2020 FINAL  Final  MRSA PCR Screening     Status: None   Collection Time: 05/16/20  9:29 PM   Specimen: Nasal Mucosa; Nasopharyngeal  Result Value Ref Range Status   MRSA by PCR NEGATIVE NEGATIVE Final     Comment:        The GeneXpert MRSA Assay (FDA approved for NASAL specimens only), is one component of a comprehensive MRSA colonization surveillance program. It is not intended to diagnose MRSA infection nor to guide or monitor treatment for MRSA infections. Performed at [facility], [address] [place], [place] [address]     Coagulation Studies: No results for input(s): LABPROT, INR in the last 72 hours.  Urinalysis: No results for input(s): COLORURINE, LABSPEC, PHURINE, GLUCOSEU, HGBUR, BILIRUBINUR, KETONESUR, PROTEINUR, UROBILINOGEN, NITRITE, LEUKOCYTESUR in the last 72 hours.  Invalid input(s): APPERANCEUR    Imaging: DG Chest 1 View  Result Date: 06/29/2020 CLINICAL DATA:  Weakness. EXAM: CHEST  1 VIEW COMPARISON:  May 08, 2020. FINDINGS: Stable cardiomegaly. Mild central pulmonary vascular congestion is noted. Interval placement of right internal jugular dialysis catheter with distal tip in expected position of cavoatrial junction. No pneumothorax is noted. Bibasilar atelectasis and pleural effusions are noted, right greater than left. Bony thorax is unremarkable. IMPRESSION: Bibasilar atelectasis and pleural effusions, right greater than left. Mild central pulmonary vascular congestion is noted. Electronically Signed  By: Marijo Conception M.D.   On: 06/29/2020 14:17     Medications:   . sodium chloride    . albumin human 25 g (06/20/20 1143)   . carvedilol  25 mg Oral BID WC  . Chlorhexidine Gluconate Cloth  6 each Topical Q0600  . cholecalciferol  1,000 Units Oral Daily  . cloNIDine  0.1 mg Transdermal Weekly  . divalproex  500 mg Oral QHS  . epoetin (EPOGEN/PROCRIT) injection  4,000 Units Intravenous Q M,W,F-HD  . feeding supplement  237 mL Oral BID BM  . heparin  5,000 Units Subcutaneous Q8H  . melatonin  5 mg Oral QPM  . multivitamin  1 tablet Oral QHS  . polyethylene glycol  17 g Oral Daily  . senna-docusate  1 tablet Oral BID  .  sevelamer carbonate  800 mg Oral TID WC  . sodium chloride flush  3 mL Intravenous Q12H  . topiramate  50 mg Oral BID  . torsemide  10 mg Oral Daily  . traZODone  50 mg Oral QPM  . venlafaxine  37.5 mg Oral BID WC  . verapamil  240 mg Oral QHS  . vitamin B-12  100 mcg Oral Daily   sodium chloride, acetaminophen **OR** acetaminophen, ALPRAZolam, benzonatate, bisacodyl, bisacodyl, diphenhydrAMINE, lidocaine, ondansetron **OR** ondansetron (ZOFRAN) IV, prochlorperazine, sodium chloride flush, [COMPLETED] tiZANidine **FOLLOWED BY** tiZANidine  Assessment/ Plan:  [name] is a 65 y.o.  female female with diastolic congestive heart failure, HTN, DM type II, CVA was admitted on 04/30/20 for volume overload.   Patient moved from [place] in December 2021. She was following with nephrology there. Patient initiated on hemodialysis this admission. Patient has a prolonged hospitalization. She was initially admitted with volume overload and acute exacerbation of congestive heart failure. Failed IV diuretics and started on hemodialysis on 12/29. No outpatient dialysis due to patient's lack of insurance and not a resident of the state of [place]. Held dialysis for possible recovery however after several days, creatinine rising and volume accumulation. Patient then resumed on dialysis. She has been weaned down to twice a week  1. ESRD on Monday ,Friday dialysis  --Next schedule treatment on Friday -discharge planning-all needed labs and scan completed -case manager finalizing outpatient center details -dialysis in chair tomorrow  2. Hypertension with CKD  - BP  elevated  -current medications offer improved control  3. Secondary Hyperparathyroidism of renal origin   - Currently prescribed Renvela TID  4. Anemia of CKD  -HGB currently 10.2.(06/28/20) -EPO ordered with dialysis   LOS: 60 Colon Flattery, NP 2/17/202212:13 PM

## 2020-06-30 NOTE — Progress Notes (Signed)
   06/24/20 1014 06/24/20 1015 06/24/20 1030  Vitals  Temp 98.3 F (36.8 C)  --   --   Temp Source Oral  --   --   BP 131/72 131/72 134/78  MAP (mmHg) 89 89 94  BP Location Left Arm  --   --   BP Method Automatic  --   --   Patient Position (if appropriate) Lying  --   --   Pulse Rate 67  --  65  Pulse Rate Source Monitor  --   --   ECG Heart Rate 67 68 65  Resp '15 17 13  '$ During Hemodialysis Assessment  Blood Flow Rate (mL/min) 300 mL/min 300 mL/min 300 mL/min  Arterial Pressure (mmHg) -100 mmHg -100 mmHg -100 mmHg  Venous Pressure (mmHg) 100 mmHg 100 mmHg 100 mmHg  Transmembrane Pressure (mmHg) 50 mmHg 50 mmHg 50 mmHg  Ultrafiltration Rate (mL/min) 840 mL/min 840 mL/min 840 mL/min  Dialysate Flow Rate (mL/min) 600 ml/min 600 ml/min 600 ml/min  Conductivity: Machine  '14 14 14  '$ HD Safety Checks Performed Yes Yes Yes  Dialysis Fluid Bolus Normal Saline  --   --   Bolus Amount (mL) 250 mL  --   --   Intra-Hemodialysis Comments Tx initiated Progressing as prescribed Progressing as prescribed    06/24/20 1045 06/24/20 1100 06/24/20 1115  Vitals  Temp  --   --   --   Temp Source  --   --   --   BP 123/75 107/67 118/70  MAP (mmHg) 90 78 83  BP Location  --   --   --   BP Method  --   --   --   Patient Position (if appropriate)  --   --   --   Pulse Rate 65 67 66  Pulse Rate Source  --   --   --   ECG Heart Rate 66 69 67  Resp '13 16 15  '$ During Hemodialysis Assessment  Blood Flow Rate (mL/min) 300 mL/min 300 mL/min 300 mL/min  Arterial Pressure (mmHg) -100 mmHg -100 mmHg -100 mmHg  Venous Pressure (mmHg) 100 mmHg 100 mmHg 100 mmHg  Transmembrane Pressure (mmHg) 50 mmHg 50 mmHg 50 mmHg  Ultrafiltration Rate (mL/min) 840 mL/min 840 mL/min 840 mL/min  Dialysate Flow Rate (mL/min) 600 ml/min 600 ml/min 600 ml/min  Conductivity: Machine  '14 14 14  '$ HD Safety Checks Performed Yes Yes Yes  Dialysis Fluid Bolus  --   --   --   Bolus Amount (mL)  --   --   --   Intra-Hemodialysis  Comments Progressing as prescribed Progressing as prescribed Progressing as prescribed    06/24/20 1130  Vitals  Temp  --   Temp Source  --   BP 117/72  MAP (mmHg) 86  BP Location  --   BP Method  --   Patient Position (if appropriate)  --   Pulse Rate 67  Pulse Rate Source  --   ECG Heart Rate 68  Resp 13  During Hemodialysis Assessment  Blood Flow Rate (mL/min) 300 mL/min  Arterial Pressure (mmHg) -100 mmHg  Venous Pressure (mmHg) 100 mmHg  Transmembrane Pressure (mmHg) 50 mmHg  Ultrafiltration Rate (mL/min) 840 mL/min  Dialysate Flow Rate (mL/min) 600 ml/min  Conductivity: Machine  14  HD Safety Checks Performed Yes  Dialysis Fluid Bolus  --   Bolus Amount (mL)  --   Intra-Hemodialysis Comments Progressing as prescribed

## 2020-06-30 NOTE — Progress Notes (Signed)
PT Cancellation Note  Patient Details Name: Katie [name] MRN: [id number] DOB: [birth year]   Cancelled Treatment:     PT attempt. Pt still awaiting breakfast requesting therapist return at later time.    Willette Pa 06/30/2020, 11:38 AM

## 2020-06-30 NOTE — Progress Notes (Signed)
   06/27/20 1100 06/27/20 1115 06/27/20 1130  Vitals  Temp 97.9 F (36.6 C)  --   --   BP (!) 181/82 (!) 152/82 (!) 148/81  MAP (mmHg) 110 100 100  Pulse Rate  --   --   --   ECG Heart Rate 69 72 70  Resp '14 14 15  '$ During Hemodialysis Assessment  Blood Flow Rate (mL/min) 400 mL/min 400 mL/min 400 mL/min  Arterial Pressure (mmHg) -180 mmHg -180 mmHg -180 mmHg  Venous Pressure (mmHg) 100 mmHg 100 mmHg 100 mmHg  Transmembrane Pressure (mmHg) 50 mmHg 50 mmHg 50 mmHg  Ultrafiltration Rate (mL/min) 800 mL/min 800 mL/min 800 mL/min  Dialysate Flow Rate (mL/min) 600 ml/min 600 ml/min 600 ml/min  Conductivity: Machine  13.9 13.9 13.9  HD Safety Checks Performed Yes Yes Yes  Dialysis Fluid Bolus Normal Saline  --   --   Bolus Amount (mL) 200 mL  --   --   Intra-Hemodialysis Comments Tx initiated (Treatment start 1054) Progressing as prescribed Progressing as prescribed    06/27/20 1145 06/27/20 1200 06/27/20 1215  Vitals  Temp  --   --   --   BP (!) 154/79 (!) 149/80 (!) 141/76  MAP (mmHg) 101 99 94  Pulse Rate  --  68 70  ECG Heart Rate 71 70 71  Resp '17 13 16  '$ During Hemodialysis Assessment  Blood Flow Rate (mL/min) 400 mL/min 400 mL/min 400 mL/min  Arterial Pressure (mmHg) -180 mmHg -180 mmHg -180 mmHg  Venous Pressure (mmHg) 100 mmHg 100 mmHg 100 mmHg  Transmembrane Pressure (mmHg) 50 mmHg 50 mmHg 50 mmHg  Ultrafiltration Rate (mL/min) 800 mL/min 800 mL/min 800 mL/min  Dialysate Flow Rate (mL/min) 600 ml/min 600 ml/min 600 ml/min  Conductivity: Machine  13.9 13.9 13.9  HD Safety Checks Performed Yes Yes Yes  Dialysis Fluid Bolus  --   --   --   Bolus Amount (mL)  --   --   --   Intra-Hemodialysis Comments Progressing as prescribed Progressing as prescribed Progressing as prescribed    06/27/20 1230 06/27/20 1245 06/27/20 1300  Vitals  Temp  --   --   --   BP (!) 151/81 (!) 146/80 (!) 145/80  MAP (mmHg) 100 98 98  Pulse Rate 69 67  --   ECG Heart Rate 69 71 74  Resp '15 15  16  '$ During Hemodialysis Assessment  Blood Flow Rate (mL/min) 400 mL/min 400 mL/min 400 mL/min  Arterial Pressure (mmHg) -180 mmHg -180 mmHg 180 mmHg  Venous Pressure (mmHg) 100 mmHg 100 mmHg 100 mmHg  Transmembrane Pressure (mmHg) 50 mmHg 50 mmHg 50 mmHg  Ultrafiltration Rate (mL/min) 800 mL/min 800 mL/min 800 mL/min  Dialysate Flow Rate (mL/min) 600 ml/min 600 ml/min 600 ml/min  Conductivity: Machine  13.9 13.9 13.9  HD Safety Checks Performed Yes  --   --   Dialysis Fluid Bolus  --   --   --   Bolus Amount (mL)  --   --   --   Intra-Hemodialysis Comments Progressing as prescribed Progressing as prescribed Progressing as prescribed

## 2020-06-30 NOTE — Progress Notes (Signed)
   06/27/20 1315 06/27/20 1330  Vitals  BP 140/83 (!) 149/80  MAP (mmHg) 99 100  Pulse Rate 70 74  ECG Heart Rate 73 73  Resp 16 16  During Hemodialysis Assessment  Blood Flow Rate (mL/min) 400 mL/min 400 mL/min  Arterial Pressure (mmHg) -180 mmHg -180 mmHg  Venous Pressure (mmHg) 100 mmHg 100 mmHg  Transmembrane Pressure (mmHg) 50 mmHg 50 mmHg  Ultrafiltration Rate (mL/min) 800 mL/min 800 mL/min  Dialysate Flow Rate (mL/min) 600 ml/min 600 ml/min  Conductivity: Machine  13.9 13.9  Intra-Hemodialysis Comments Progressing as prescribed Tx completed

## 2020-06-30 NOTE — Progress Notes (Signed)
Reopening referral, waiting on clinic acceptance.

## 2020-06-30 NOTE — Progress Notes (Signed)
PROGRESS NOTE    Katie [name]  [id number] DOB: [birth year] DOA: 04/30/2020 PCP: Patient, [name]    Brief Narrative:   65 y.o.female with a history of diabetes mellitus type 2, stage IV CKD, heart failure, hypertension. Patient presented to the ED on 04/30/20 with complaints of worsening dyspnea with evidence of volume overload and pleural effusion.  Hospital stay has been prolonged due to disposition challenges and no safe discharge plan, due to lack of insurance difficulty with SNF placement in patient too profoundly weak to safely return home.    Acute medical issues have resolved and patient is medically stable for discharge once adequate plans are in place.  Senior case management involved and bed search has been expanded.  Ongoing issues are orthostatic hypotension and headaches.  Neurology consulted and patient on multiple agents for prevention and abortive therapy.  Now off opiates completely so hopeful overuse headache component continues to improve.      2/17-pt reports the friend who she is going to stay has the flu now. Also I spoke to nephrology Dr. Abigail Butts, pt does not have outpatient dialysis spot yet. Case mx reports medicaid application was done last week.   2/17- participating with PT per pt. Case mx looking to find HD spot  Consultants:   nephrology  Procedures: HD  Antimicrobials:       Subjective: No HA, pain, sob, or any complaints  Objective: Vitals:   06/30/20 0041 06/30/20 0432 06/30/20 0500 06/30/20 0732  BP: (!) 163/68 (!) 162/70  (!) 157/63  Pulse: 66 65  65  Resp: '16 16  15  '$ Temp: 97.7 F (36.5 C) 97.8 F (36.6 C)  97.8 F (36.6 C)  TempSrc: Oral Oral  Oral  SpO2: 96% 98%  97%  Weight:   72.9 kg   Height:        Intake/Output Summary (Last 24 hours) at 06/30/2020 1026 Last data filed at 06/30/2020 0700 Gross per 24 hour  Intake 240 ml  Output 900 ml  Net -660 ml   Filed Weights   06/26/20 0500 06/27/20 0500 06/30/20  0500  Weight: 72.4 kg 72.2 kg 72.9 kg    Examination: Nad, calm cta no w/r/r Regular s1/s2 Soft benign +bs No edema Aaxox3, grossly intact.     Data Reviewed: I have personally reviewed following labs and imaging studies  CBC: Recent Labs  Lab 06/24/20 0500 06/27/20 0909 06/28/20 0542  WBC 6.0 5.8 5.4  HGB 9.0* 10.6* 10.2*  HCT 27.1* 31.8* 31.8*  MCV 95.1 94.4 96.1  PLT 265 276 [id number]   Basic Metabolic Panel: Recent Labs  Lab 06/24/20 0500 06/27/20 0909 06/28/20 0542  NA 136 137 138  K 4.5 2.9* 3.3*  CL 104 103 103  CO2 '24 26 27  '$ GLUCOSE 112* 94 94  BUN 51* 25* 39*  CREATININE 3.00* 1.39* 2.22*  CALCIUM 8.2* 8.0* 8.2*  MG 2.3  --  2.2  PHOS 6.1* 2.7  --    GFR: Estimated Creatinine Clearance: 26.8 mL/min (A) (by C-G formula based on SCr of 2.22 mg/dL (H)). Liver Function Tests: Recent Labs  Lab 06/27/20 0909  ALBUMIN 2.7*   No results for input(s): LIPASE, AMYLASE in the last 168 hours. No results for input(s): AMMONIA in the last 168 hours. Coagulation Profile: No results for input(s): INR, PROTIME in the last 168 hours. Cardiac Enzymes: No results for input(s): CKTOTAL, CKMB, CKMBINDEX, TROPONINI in the last 168 hours. BNP (last 3 results) No results for input(s):  PROBNP in the last 8760 hours. HbA1C: No results for input(s): HGBA1C in the last 72 hours. CBG: Recent Labs  Lab 06/23/20 1153 06/23/20 1616 06/23/20 2014 06/26/20 0908  GLUCAP 120* 129* 102* 84   Lipid Profile: No results for input(s): CHOL, HDL, LDLCALC, TRIG, CHOLHDL, LDLDIRECT in the last 72 hours. Thyroid Function Tests: No results for input(s): TSH, T4TOTAL, FREET4, T3FREE, THYROIDAB in the last 72 hours. Anemia Panel: No results for input(s): VITAMINB12, FOLATE, FERRITIN, TIBC, IRON, RETICCTPCT in the last 72 hours. Sepsis Labs: No results for input(s): PROCALCITON, LATICACIDVEN in the last 168 hours.  No results found for this or any previous visit (from the past 240  hour(s)).       Radiology Studies: DG Chest 1 View  Result Date: 06/29/2020 CLINICAL DATA:  Weakness. EXAM: CHEST  1 VIEW COMPARISON:  May 08, 2020. FINDINGS: Stable cardiomegaly. Mild central pulmonary vascular congestion is noted. Interval placement of right internal jugular dialysis catheter with distal tip in expected position of cavoatrial junction. No pneumothorax is noted. Bibasilar atelectasis and pleural effusions are noted, right greater than left. Bony thorax is unremarkable. IMPRESSION: Bibasilar atelectasis and pleural effusions, right greater than left. Mild central pulmonary vascular congestion is noted. Electronically Signed   By: Marijo Conception M.D.   On: 06/29/2020 14:17        Scheduled Meds: . carvedilol  25 mg Oral BID WC  . Chlorhexidine Gluconate Cloth  6 each Topical Q0600  . cholecalciferol  1,000 Units Oral Daily  . cloNIDine  0.1 mg Transdermal Weekly  . divalproex  500 mg Oral QHS  . epoetin (EPOGEN/PROCRIT) injection  4,000 Units Intravenous Q M,W,F-HD  . feeding supplement  237 mL Oral BID BM  . heparin  5,000 Units Subcutaneous Q8H  . melatonin  5 mg Oral QPM  . multivitamin  1 tablet Oral QHS  . polyethylene glycol  17 g Oral Daily  . senna-docusate  1 tablet Oral BID  . sevelamer carbonate  800 mg Oral TID WC  . sodium chloride flush  3 mL Intravenous Q12H  . topiramate  50 mg Oral BID  . torsemide  10 mg Oral Daily  . traZODone  50 mg Oral QPM  . venlafaxine  37.5 mg Oral BID WC  . verapamil  240 mg Oral QHS  . vitamin B-12  100 mcg Oral Daily   Continuous Infusions: . sodium chloride    . albumin human 25 g (06/20/20 1143)    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)   Hyperkalemia   Pressure injury of skin   Pleural effusion   Acute heart failure with  preserved ejection fraction (HFpEF) (HCC)   Chronic kidney disease (CKD), stage IV (severe) (HCC)   Pulmonary hypertension, unspecified (HCC)   ATN (acute tubular necrosis) (HCC)   Persistent headaches   Headache, persistent / History of chronic migraine headaches / Medication Overuse Headaches -patient reported using Imitrex as needed.  Has been transitioned entirely off opioids.   MRI brain, MRA, MRV were unremarkable --Neurology consulted, recommendations: --d/c Imitrex (has overused) --continue prophylactics: venlafaxine, Topamax  --continue Depakote taper & tizanidine helped to wean off opiates --2/17-continue clonidine patch for high bp control Continue verapamil Outpatient neurology f/u for nerve blocks, botox, CGRP inhibitor consideration  Acute on chronic diastolic CHF -echo in December 2021 showed normal EF and grade 2 diastolic dysfunction, associated RV systolic dysfunction and elevated PA pressure seen on TTE however right heart cath on 12/28 significantly for mildly elevated pulmonary artery pressures. Presented volume overloaded, initially managed with IV Lasix but had worsening renal function.  Unfortunately her CKD has progressed to ESRD and now on dialysis.   2/17-continue HD  Per nephrology Continue torsemide '10mg'$  qd Daily weight    ESRD -unknown baseline renal function.  Patient unsure primary nephrologist name in [place]. Presented with creatinine 2.97.  Patient reported plan was to start dialysis and consideration of future renal transplant.  Renal function worsened over course of admission secondary to diuresis for acute CHF and volume overload.  She has been started on dialysis.   Anticipate outpatient dialysis will be [facility]. 2/17-per nephrology, pt currently does not have a spot yet, awaiting insurance establishment. Case mx aware.  Orthostatic hypotension -likely secondary to autonomic dysfunction.  Has baseline elevated blood  pressures.  Was tried on midodrine which had to be stopped due to systolic pressures severely elevated 190s to 200s. --TED hose and abdominal binder when oob/mobility 2/17-continue with PT Check orthostatics periodically Continue midodrine    Pleural effusion, right side -likely due to volume overload in the setting of diastolic dysfunction and renal failure.  Status post right thoracentesis on 12/20.   No evidence of malignancy was seen on cytology Monitor      Pericardial effusion, moderate -seen on echo, no evidence of tamponade.  No recent infections to attribute this to.  In the setting of CKD 5 and elevated BUN, likely secondary to uremia.  Patient has no chest pain.  Cardiology recommended outpatient echo for surveillance.   Essential hypertension - with labile BP's, orthostatic hypotension. PTA was on multiple agents including Coreg, Cozaar, torsemide. Had been tried on midodrine for orthostasis but stopped due to severely elevated pressures.   Losartan discontinued and started on clonidine by nephrology.   --Continue Coreg 25 mg mg BID (also helps HA's) --Continue clonidine 0.1 mg patch every 7 days --Continue torsemide 10 mg daily --Also on verapamil for HA's as above --May require additional agent   Anemia of chronic renal disease -with macrocytosis on CBC.  TIBC low but otherwise normal iron studies.  Vitamin B12 level 358.  Continue EPO with dialysis per renal.  Monitor CBC.   History of type 2 diabetes -last A1c 4.7%, not currently diabetic and CBGs well controlled.     History of tobacco abuse -quit at least 10 years ago, prior 20-pack-year history.   Insomnia -continue melatonin and trazodone.  No vitals overnight when patient sleeping, and minimize interruptions in sleep.   Pressure injury, mid coccyx -present on admission. Pressure Injury 04/30/20 Coccyx Mid Stage 2 -  Partial thickness loss of dermis presenting as a shallow open injury with  a red, pink wound bed without slough. (Active)  04/30/20 2130  Location: Coccyx  Location Orientation: Mid  Staging: Stage 2 -  Partial thickness loss of dermis presenting as a shallow open injury with a red, pink wound bed without slough.  Wound Description (Comments):   Present on Admission: Yes       DVT prophylaxis: heparin Code Status: Full Family Communication: None at bedside  Status is: Inpatient  Remains inpatient appropriate because:Unsafe d/c plan   Dispo: The patient is from: Home              Anticipated  d/c is to: Home               Anticipated d/c date is: TBD-awaiting HD spot as outpt              Patient currently is medically stable to d/c.awaiting HD outpt spot. Possible going home to friends house but friend with flu.   Difficult to place patient :yes            LOS: 60 days   Time spent: 35 min with >50% on coc    Nolberto Hanlon, MD [facility] Pager [phone]  If 7PM-7AM, please contact night-coverage 06/30/2020, 10:26 AM

## 2020-07-01 LAB — BASIC METABOLIC PANEL
Anion gap: 8 (ref 5–15)
BUN: 53 mg/dL — ABNORMAL HIGH (ref 8–23)
CO2: 23 mmol/L (ref 22–32)
Calcium: 8 mg/dL — ABNORMAL LOW (ref 8.9–10.3)
Chloride: 104 mmol/L (ref 98–111)
Creatinine, Ser: 3.04 mg/dL — ABNORMAL HIGH (ref 0.44–1.00)
GFR, Estimated: 17 mL/min — ABNORMAL LOW (ref >60)
Glucose, Bld: 108 mg/dL — ABNORMAL HIGH (ref 70–99)
Potassium: 4.6 mmol/L (ref 3.5–5.1)
Sodium: 135 mmol/L (ref 135–145)

## 2020-07-01 LAB — CBC
HCT: 30.9 % — ABNORMAL LOW (ref 36.0–46.0)
Hemoglobin: 10 g/dL — ABNORMAL LOW (ref 12.0–15.0)
MCH: 31.9 pg (ref 26.0–34.0)
MCHC: 32.4 g/dL (ref 30.0–36.0)
MCV: 98.7 fL (ref 80.0–100.0)
Platelets: 243 10*3/uL (ref 150–400)
RBC: 3.13 MIL/uL — ABNORMAL LOW (ref 3.87–5.11)
RDW: 13.9 % (ref 11.5–15.5)
WBC: 5 10*3/uL (ref 4.0–10.5)
nRBC: 0 % (ref 0.0–0.2)

## 2020-07-01 LAB — MAGNESIUM: Magnesium: 2.1 mg/dL (ref 1.7–2.4)

## 2020-07-01 MED ORDER — MIDODRINE HCL 5 MG PO TABS
10.0000 mg | ORAL_TABLET | Freq: Three times a day (TID) | ORAL | Status: DC
Start: 2020-07-01 — End: 2020-07-02

## 2020-07-01 NOTE — Progress Notes (Signed)
PROGRESS NOTE    Katie [name]  [id number] DOB: [birth year] DOA: 04/30/2020 PCP: Patient, [name]    Brief Narrative:   65 y.o.female with a history of diabetes mellitus type 2, stage IV CKD, heart failure, hypertension. Patient presented to the ED on 04/30/20 with complaints of worsening dyspnea with evidence of volume overload and pleural effusion.  Hospital stay has been prolonged due to disposition challenges and no safe discharge plan, due to lack of insurance difficulty with SNF placement in patient too profoundly weak to safely return home.    Acute medical issues have resolved and patient is medically stable for discharge once adequate plans are in place.  Senior case management involved and bed search has been expanded.  Ongoing issues are orthostatic hypotension and headaches.  Neurology consulted and patient on multiple agents for prevention and abortive therapy.  Now off opiates completely so hopeful overuse headache component continues to improve.      2/17-pt reports the friend who she is going to stay has the flu now. Also I spoke to nephrology Dr. Abigail Butts, pt does not have outpatient dialysis spot yet. Case mx reports medicaid application was done last week.   2/17- participating with PT per pt. Case mx looking to find HD spot 2/18-pt was in HD, with lying bp 83/47 , was given albumin to help.  Consultants:   nephrology  Procedures: HD  Antimicrobials:       Subjective: Mild dizziness, but no other complaints.  Objective: Vitals:   07/01/20 1230 07/01/20 1245 07/01/20 1300 07/01/20 1315  BP: (!) 141/64 133/66 (!) 141/69 (!) 176/72  Pulse: 67 67 67 69  Resp: '20 18 18 18  '$ Temp:      TempSrc:      SpO2:      Weight:      Height:        Intake/Output Summary (Last 24 hours) at 07/01/2020 1424 Last data filed at 07/01/2020 1315 Gross per 24 hour  Intake 578 ml  Output 1900 ml  Net -1322 ml   Filed Weights   06/27/20 0500 06/30/20 0500  07/01/20 0551  Weight: 72.2 kg 72.9 kg 71.8 kg    Examination: Calm, NAD CTA, no wheeze rales rhonchi RRR s1/s2 Soft benign +bs No edema aaxox3    Data Reviewed: I have personally reviewed following labs and imaging studies  CBC: Recent Labs  Lab 06/27/20 0909 06/28/20 0542 07/01/20 1015  WBC 5.8 5.4 5.0  HGB 10.6* 10.2* 10.0*  HCT 31.8* 31.8* 30.9*  MCV 94.4 96.1 98.7  PLT 276 274 0000000   Basic Metabolic Panel: Recent Labs  Lab 06/27/20 0909 06/28/20 0542 07/01/20 1015  NA 137 138 135  K 2.9* 3.3* 4.6  CL 103 103 104  CO2 '26 27 23  '$ GLUCOSE 94 94 108*  BUN 25* 39* 53*  CREATININE 1.39* 2.22* 3.04*  CALCIUM 8.0* 8.2* 8.0*  MG  --  2.2 2.1  PHOS 2.7  --   --    GFR: Estimated Creatinine Clearance: 19.5 mL/min (A) (by C-G formula based on SCr of 3.04 mg/dL (H)). Liver Function Tests: Recent Labs  Lab 06/27/20 0909  ALBUMIN 2.7*   No results for input(s): LIPASE, AMYLASE in the last 168 hours. No results for input(s): AMMONIA in the last 168 hours. Coagulation Profile: No results for input(s): INR, PROTIME in the last 168 hours. Cardiac Enzymes: No results for input(s): CKTOTAL, CKMB, CKMBINDEX, TROPONINI in the last 168 hours. BNP (last 3 results)  No results for input(s): PROBNP in the last 8760 hours. HbA1C: No results for input(s): HGBA1C in the last 72 hours. CBG: Recent Labs  Lab 06/26/20 0908  GLUCAP 84   Lipid Profile: No results for input(s): CHOL, HDL, LDLCALC, TRIG, CHOLHDL, LDLDIRECT in the last 72 hours. Thyroid Function Tests: No results for input(s): TSH, T4TOTAL, FREET4, T3FREE, THYROIDAB in the last 72 hours. Anemia Panel: No results for input(s): VITAMINB12, FOLATE, FERRITIN, TIBC, IRON, RETICCTPCT in the last 72 hours. Sepsis Labs: No results for input(s): PROCALCITON, LATICACIDVEN in the last 168 hours.  No results found for this or any previous visit (from the past 240 hour(s)).       Radiology Studies: No results  found.      Scheduled Meds: . carvedilol  25 mg Oral BID WC  . Chlorhexidine Gluconate Cloth  6 each Topical Q0600  . cholecalciferol  1,000 Units Oral Daily  . cloNIDine  0.1 mg Transdermal Weekly  . epoetin (EPOGEN/PROCRIT) injection  4,000 Units Intravenous Q M,W,F-HD  . feeding supplement  237 mL Oral BID BM  . heparin  5,000 Units Subcutaneous Q8H  . melatonin  5 mg Oral QPM  . multivitamin  1 tablet Oral QHS  . polyethylene glycol  17 g Oral Daily  . senna-docusate  1 tablet Oral BID  . sevelamer carbonate  800 mg Oral TID WC  . sodium chloride flush  3 mL Intravenous Q12H  . topiramate  50 mg Oral BID  . torsemide  10 mg Oral Daily  . traZODone  50 mg Oral QPM  . venlafaxine  37.5 mg Oral BID WC  . verapamil  240 mg Oral QHS  . vitamin B-12  100 mcg Oral Daily   Continuous Infusions: . sodium chloride    . albumin human 25 g (07/01/20 1018)    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)   Hyperkalemia   Pressure injury of skin   Pleural effusion   Acute heart failure with preserved ejection fraction (HFpEF) (HCC)   Chronic kidney disease (CKD), stage IV (severe) (HCC)   Pulmonary hypertension, unspecified (HCC)   ATN (acute tubular necrosis) (HCC)   Persistent headaches   Headache, persistent / History of chronic migraine headaches / Medication Overuse Headaches -patient reported using Imitrex as needed.  Has been transitioned entirely off opioids.   MRI brain, MRA, MRV were unremarkable --Neurology consulted, recommendations: --d/c Imitrex (has overused) --continue prophylactics: venlafaxine, Topamax  --continue Depakote taper & tizanidine helped to wean off opiates --2/18-continue clonidine patch for high bp control Continue verapamil Outpatient neurology follow-up for nerve blocks,  Botox, CGRP inhibitor consideration        Acute on chronic diastolic CHF -echo in December 2021 showed normal EF and grade 2 diastolic dysfunction, associated RV systolic dysfunction and elevated PA pressure seen on TTE however right heart cath on 12/28 significantly for mildly elevated pulmonary artery pressures. Presented volume overloaded, initially managed with IV Lasix but had worsening renal function.  Unfortunately her CKD has progressed to ESRD and now on dialysis.   2/17-continue HD  Per nephrology Continue torsemide '10mg'$  qd Daily weight    ESRD -unknown baseline renal function.  Patient unsure primary nephrologist name in [place]. Presented with creatinine 2.97.  Patient reported plan was to start dialysis  and consideration of future renal transplant.  Renal function worsened over course of admission secondary to diuresis for acute CHF and volume overload.  She has been started on dialysis.   Anticipate outpatient dialysis will be [facility]. 2/17-per nephrology, pt currently does not have a spot yet, awaiting insurance establishment. Case mx aware.  Orthostatic hypotension -likely secondary to autonomic dysfunction.  Has baseline elevated blood pressures.  Was tried on midodrine which had to be stopped due to systolic pressures severely elevated 190s to 200s. --TED hose and abdominal binder when oob/mobility 2/18 -today during dialysis had to get albumin due to low BP with systolic at 83.   Continue PT as tolerated Was on midodrine, I dont see on mar today, will ck with nephrology to see if any changes made     Pleural effusion, right side -likely due to volume overload in the setting of diastolic dysfunction and renal failure.  Status post right thoracentesis on 12/20.   No evidence of malignancy was seen on cytology Monitor      Pericardial effusion, moderate -seen on echo, no evidence of tamponade.  No recent infections to attribute this to.  In  the setting of CKD 5 and elevated BUN, likely secondary to uremia.  Patient has no chest pain.  Cardiology recommended outpatient echo for surveillance.   Essential hypertension - with labile BP's, orthostatic hypotension. PTA was on multiple agents including Coreg, Cozaar, torsemide. Had been tried on midodrine for orthostasis but stopped due to severely elevated pressures.   Losartan discontinued and started on clonidine by nephrology.   --Continue Coreg 25 mg mg BID (also helps HA's) --Continue clonidine 0.1 mg patch every 7 days --Continue torsemide 10 mg daily --Also on verapamil for HA's as above --May require additional agent   Anemia of chronic renal disease -with macrocytosis on CBC.  TIBC low but otherwise normal iron studies.  Vitamin B12 level 358.  Continue EPO with dialysis per renal.  Monitor CBC.   History of type 2 diabetes -last A1c 4.7%, not currently diabetic and CBGs well controlled.     History of tobacco abuse -quit at least 10 years ago, prior 20-pack-year history.   Insomnia -continue melatonin and trazodone.  No vitals overnight when patient sleeping, and minimize interruptions in sleep.   Pressure injury, mid coccyx -present on admission. Pressure Injury 04/30/20 Coccyx Mid Stage 2 -  Partial thickness loss of dermis presenting as a shallow open injury with a red, pink wound bed without slough. (Active)  04/30/20 2130  Location: Coccyx  Location Orientation: Mid  Staging: Stage 2 -  Partial thickness loss of dermis presenting as a shallow open injury with a red, pink wound bed without slough.  Wound Description (Comments):   Present on Admission: Yes       DVT prophylaxis: heparin Code Status: Full Family Communication: None at bedside  Status is: Inpatient  Remains inpatient appropriate because:Unsafe d/c plan   Dispo: The patient is from: Home              Anticipated d/c is to: Home               Anticipated d/c date is:  1-2 days              Patient currently is medically stable to d/c.bp dropped lying needing albumin infusion today during HD, need to make sure stable prior to dc   Difficult to place patient :yes  LOS: 61 days   Time spent: 35 min with >50% on coc    Nolberto Hanlon, MD [facility] Pager [phone]  If 7PM-7AM, please contact night-coverage 07/01/2020, 2:24 PM

## 2020-07-01 NOTE — Progress Notes (Signed)
Patient accepted at [facility] 6:45 and can start on Tuesday 2/22 at 6:15. Patient did confirm that her friend could transport until transportation could be arranged through the clinic.

## 2020-07-01 NOTE — TOC Progression Note (Addendum)
Transition of Care [facility]) - Progression Note    Patient Details  Name: Katie [name] MRN: [id number] Date of Birth: Jul 22, 1955  Transition of Care [facility] CM/SW [place], LCSW Phone Number: 07/01/2020, 8:55 AM  Clinical Narrative:   Tommi Rumps with [facility] accepted for charity PT and OT services until patient's Medicare is processed (with help from Dialysis Center staff). Tommi Rumps reported patient will be followed by an Therapist, sports and SW at Dialysis. TOC Supervisor updated.  Waiting on Dialysis chair time.  11:25- Patient has a Dialysis chair time. Per MD, patient not medically ready today due to BP issues, but possibly tomorrow. Left handoff for weekend TOC.  Expected Discharge Plan: Skilled Nursing Facility Barriers to Discharge: Continued Medical Work up,Homeless with medical needs  Expected Discharge Plan and Services Expected Discharge Plan: [place] In-house Referral: Clinical Social Work Discharge Planning Services: Homebound not met per provider Post Acute Care Choice: [place] Living arrangements for the past 2 months: No permanent address (Lived with Sister and family)                 DME Arranged: N/A DME Agency: NA       HH Arranged: NA HH Agency: NA         Social Determinants of Health (SDOH) Interventions    Readmission Risk Interventions No flowsheet data found.

## 2020-07-01 NOTE — Progress Notes (Signed)
UF off due to low BP, RN aware and albumin is currently activated by nurse

## 2020-07-01 NOTE — Progress Notes (Signed)
UF back on RN aware

## 2020-07-01 NOTE — Progress Notes (Signed)
Pt refused AM labs, preferred for them to be drawn with scheduled HD session today.

## 2020-07-01 NOTE — Progress Notes (Signed)
[facility]  ROUNDING NOTE   Subjective:   Patient seen resting in bed Alert oriented, talkative She feels she is ready to go home Denies shortness of breath and chest pain States right side still aches, but not bothersome Able to eat meals without nausea  Objective:  Vital signs in last 24 hours:  Temp:  [97.7 F (36.5 C)-98.9 F (37.2 C)] 98 F (36.7 C) (02/18 0744) Pulse Rate:  [62-75] 62 (02/18 0744) Resp:  [14-20] 16 (02/18 0744) BP: (125-173)/(62-71) 164/70 (02/18 0744) SpO2:  [98 %-99 %] 98 % (02/18 0744) Weight:  [71.8 kg] 71.8 kg (02/18 0551)  Weight change: -1.1 kg Filed Weights   06/27/20 0500 06/30/20 0500 07/01/20 0551  Weight: 72.2 kg 72.9 kg 71.8 kg    Intake/Output: I/O last 3 completed shifts: In: 32 [P.O.:698] Out: 1800 [Urine:1800]   Intake/Output this shift:  Total I/O In: 120 [P.O.:120] Out: -   Physical Exam: General: NAD, resting in bed  Head: Normocephalic, atraumatic. Moist oral mucosal membranes  Eyes: Anicteric  Neck: Supple, trachea midline  Lungs:  Clear bilaterally  Heart: Regular rate and rhythm  Abdomen:  Soft, nontender,   Extremities:  no peripheral edema.  Neurologic: Nonfocal, moving all four extremities  Skin: No lesions  Access: Right IJ Permcath     Basic Metabolic Panel: Recent Labs  Lab 06/27/20 0909 06/28/20 0542  NA 137 138  K 2.9* 3.3*  CL 103 103  CO2 26 27  GLUCOSE 94 94  BUN 25* 39*  CREATININE 1.39* 2.22*  CALCIUM 8.0* 8.2*  MG  --  2.2  PHOS 2.7  --     Liver Function Tests: Recent Labs  Lab 06/27/20 0909  ALBUMIN 2.7*   No results for input(s): LIPASE, AMYLASE in the last 168 hours. No results for input(s): AMMONIA in the last 168 hours.  CBC: Recent Labs  Lab 06/27/20 0909 06/28/20 0542  WBC 5.8 5.4  HGB 10.6* 10.2*  HCT 31.8* 31.8*  MCV 94.4 96.1  PLT 276 274    Cardiac Enzymes: No results for input(s): CKTOTAL, CKMB, CKMBINDEX, TROPONINI in the last 168  hours.  BNP: Invalid input(s): POCBNP  CBG: Recent Labs  Lab 06/26/20 0908  [id number]    Microbiology: Results for orders placed or performed during the hospital encounter of 04/30/20  Resp Panel by RT-PCR (Flu A&B, Covid) Nasopharyngeal Swab     Status: None   Collection Time: 04/30/20  4:01 PM   Specimen: Nasopharyngeal Swab; Nasopharyngeal(NP) swabs in vial transport medium  Result Value Ref Range Status   SARS Coronavirus 2 by RT PCR NEGATIVE NEGATIVE Final    Comment: (NOTE) SARS-CoV-2 target nucleic acids are NOT DETECTED.  The SARS-CoV-2 RNA is generally detectable in upper respiratory specimens during the acute phase of infection. The lowest concentration of SARS-CoV-2 viral copies this assay can detect is 138 copies/mL. A negative result does not preclude SARS-Cov-2 infection and should not be used as the sole basis for treatment or other patient management decisions. A negative result may occur with  improper specimen collection/handling, submission of specimen other than nasopharyngeal swab, presence of viral mutation(s) within the areas targeted by this assay, and inadequate number of viral copies(<138 copies/mL). A negative result must be combined with clinical observations, patient history, and epidemiological information. The expected result is Negative.  Fact Sheet for Patients:  EntrepreneurPulse.com.au  Fact Sheet for Healthcare Providers:  IncredibleEmployment.be  This test is no t yet approved or cleared by  the [employer] and  has been authorized for detection and/or diagnosis of SARS-CoV-2 by FDA under an Emergency Use Authorization (EUA). This EUA will remain  in effect (meaning this test can be used) for the duration of the COVID-19 declaration under Section 564(b)(1) of the Act, 21 U.S.C.section 360bbb-3(b)(1), unless the authorization is terminated  or revoked sooner.       Influenza A by PCR  NEGATIVE NEGATIVE Final   Influenza B by PCR NEGATIVE NEGATIVE Final    Comment: (NOTE) The Xpert Xpress SARS-CoV-2/FLU/RSV plus assay is intended as an aid in the diagnosis of influenza from Nasopharyngeal swab specimens and should not be used as a sole basis for treatment. Nasal washings and aspirates are unacceptable for Xpert Xpress SARS-CoV-2/FLU/RSV testing.  Fact Sheet for Patients: EntrepreneurPulse.com.au  Fact Sheet for Healthcare Providers: IncredibleEmployment.be  This test is not yet approved or cleared by the [place] FDA and has been authorized for detection and/or diagnosis of SARS-CoV-2 by FDA under an Emergency Use Authorization (EUA). This EUA will remain in effect (meaning this test can be used) for the duration of the COVID-19 declaration under Section 564(b)(1) of the Act, 21 U.S.C. section 360bbb-3(b)(1), unless the authorization is terminated or revoked.  Performed at [facility], [address] [place], [place] [address]   Body fluid culture     Status: None   Collection Time: 05/02/20  4:44 PM   Specimen: PATH Cytology Pleural fluid  Result Value Ref Range Status   Specimen Description   Final    PLEURAL Performed at [facility], [address], [place], [place] [address]    Special Requests   Final    NONE Performed at [facility], [address] [place], [place] [address]    Gram Stain   Final    MODERATE WBC PRESENT, PREDOMINANTLY MONONUCLEAR NO ORGANISMS SEEN    Culture   Final    NO GROWTH Performed at [facility], [place] [address], [place], [place] [address]    Report Status 05/06/2020 FINAL  Final  MRSA PCR Screening     Status: None   Collection Time: 05/16/20  9:29 PM   Specimen: Nasal Mucosa; Nasopharyngeal  Result Value Ref Range Status   MRSA by PCR NEGATIVE NEGATIVE Final    Comment:        The GeneXpert MRSA Assay (FDA approved for NASAL  specimens only), is one component of a comprehensive MRSA colonization surveillance program. It is not intended to diagnose MRSA infection nor to guide or monitor treatment for MRSA infections. Performed at [facility], [address] [place], [place] [address]     Coagulation Studies: No results for input(s): LABPROT, INR in the last 72 hours.  Urinalysis: No results for input(s): COLORURINE, LABSPEC, PHURINE, GLUCOSEU, HGBUR, BILIRUBINUR, KETONESUR, PROTEINUR, UROBILINOGEN, NITRITE, LEUKOCYTESUR in the last 72 hours.  Invalid input(s): APPERANCEUR    Imaging: DG Chest 1 View  Result Date: 06/29/2020 CLINICAL DATA:  Weakness. EXAM: CHEST  1 VIEW COMPARISON:  May 08, 2020. FINDINGS: Stable cardiomegaly. Mild central pulmonary vascular congestion is noted. Interval placement of right internal jugular dialysis catheter with distal tip in expected position of cavoatrial junction. No pneumothorax is noted. Bibasilar atelectasis and pleural effusions are noted, right greater than left. Bony thorax is unremarkable. IMPRESSION: Bibasilar atelectasis and pleural effusions, right greater than left. Mild central pulmonary vascular congestion is noted. Electronically Signed   By: Bobbe Medico.D.  On: 06/29/2020 14:17     Medications:   . sodium chloride    . albumin human 25 g (07/01/20 1018)   . carvedilol  25 mg Oral BID WC  . Chlorhexidine Gluconate Cloth  6 each Topical Q0600  . cholecalciferol  1,000 Units Oral Daily  . cloNIDine  0.1 mg Transdermal Weekly  . epoetin (EPOGEN/PROCRIT) injection  4,000 Units Intravenous Q M,W,F-HD  . feeding supplement  237 mL Oral BID BM  . heparin  5,000 Units Subcutaneous Q8H  . melatonin  5 mg Oral QPM  . multivitamin  1 tablet Oral QHS  . polyethylene glycol  17 g Oral Daily  . senna-docusate  1 tablet Oral BID  . sevelamer carbonate  800 mg Oral TID WC  . sodium chloride flush  3 mL Intravenous Q12H  . topiramate  50  mg Oral BID  . torsemide  10 mg Oral Daily  . traZODone  50 mg Oral QPM  . venlafaxine  37.5 mg Oral BID WC  . verapamil  240 mg Oral QHS  . vitamin B-12  100 mcg Oral Daily   sodium chloride, acetaminophen **OR** acetaminophen, ALPRAZolam, benzonatate, bisacodyl, bisacodyl, diphenhydrAMINE, lidocaine, ondansetron **OR** ondansetron (ZOFRAN) IV, prochlorperazine, sodium chloride flush, [COMPLETED] tiZANidine **FOLLOWED BY** tiZANidine  Assessment/ Plan:  [name] is a 65 y.o.  female female with diastolic congestive heart failure, HTN, DM type II, CVA was admitted on 04/30/20 for volume overload.   Patient moved from [place] in December 2021. She was following with nephrology there. Patient initiated on hemodialysis this admission. Patient has a prolonged hospitalization. She was initially admitted with volume overload and acute exacerbation of congestive heart failure. Failed IV diuretics and started on hemodialysis on 12/29. No outpatient dialysis due to patient's lack of insurance and not a resident of the state of [place]. Held dialysis for possible recovery however after several days, creatinine rising and volume accumulation. Patient then resumed on dialysis. She has been weaned down to twice a week  1. ESRD on Monday ,Friday dialysis  --Scheduled for dialysis today -Outpatient treatment- [facility]  2. Hypertension with CKD  - BP  elevated but stable -current medications offer improved control  3. Secondary Hyperparathyroidism of renal origin   - Currently prescribed Renvela TID  4. Anemia of CKD  -HGB currently 10.2.(06/28/20) -EPO given with dialysis   LOS: 61 Colon Flattery, NP 2/18/202210:37 AM

## 2020-07-01 NOTE — Progress Notes (Signed)
PT Cancellation Note  Patient Details Name: Katie [name] MRN: [id number] DOB: 30-Dec-1955   Cancelled Treatment:     PT attempt. Pt still off floor in HD. Will continue efforts to treat and continue to follow pt per POC.    Willette Pa 07/01/2020, 1:54 PM

## 2020-07-01 NOTE — Progress Notes (Signed)
PT Cancellation Note  Patient Details Name: Katie [name] MRN: [id number] DOB: [birth year]   Cancelled Treatment:      PT attempt. Pt is in route to HD. Will return later this afternoon and continue to follow per POC.   Willette Pa 07/01/2020, 9:59 AM

## 2020-07-01 NOTE — Progress Notes (Signed)
Blood lines are reversed due to high AP RN aware

## 2020-07-02 MED ORDER — VERAPAMIL HCL ER 240 MG PO TBCR: 240.0000 mg | EXTENDED_RELEASE_TABLET | Freq: Every day | ORAL | 0 refills | Status: DC

## 2020-07-02 MED ORDER — CARVEDILOL 25 MG PO TABS: 25.0000 mg | ORAL_TABLET | Freq: Two times a day (BID) | ORAL | 0 refills | Status: DC

## 2020-07-02 MED ORDER — SENNOSIDES-DOCUSATE SODIUM 8.6-50 MG PO TABS: 1.0000 | ORAL_TABLET | Freq: Two times a day (BID) | ORAL | 0 refills | Status: AC

## 2020-07-02 MED ORDER — RENA-VITE PO TABS: 1.0000 | ORAL_TABLET | Freq: Every day | ORAL | 0 refills | Status: DC

## 2020-07-02 MED ORDER — RENA-VITE PO TABS: 1.0000 | ORAL_TABLET | Freq: Every day | ORAL | 0 refills | Status: AC

## 2020-07-02 MED ORDER — SENNOSIDES-DOCUSATE SODIUM 8.6-50 MG PO TABS: 1.0000 | ORAL_TABLET | Freq: Two times a day (BID) | ORAL | 0 refills | Status: DC

## 2020-07-02 MED ORDER — TRAZODONE HCL 50 MG PO TABS: 50.0000 mg | ORAL_TABLET | Freq: Every evening | ORAL | 0 refills | Status: DC

## 2020-07-02 MED ORDER — TORSEMIDE 10 MG PO TABS
10.0000 mg | ORAL_TABLET | Freq: Every day | ORAL | 0 refills | Status: DC
Start: 2020-07-03 — End: 2021-03-21

## 2020-07-02 MED ORDER — TOPIRAMATE 50 MG PO TABS: 50.0000 mg | ORAL_TABLET | Freq: Two times a day (BID) | ORAL | 0 refills | Status: DC

## 2020-07-02 MED ORDER — SEVELAMER CARBONATE 800 MG PO TABS
800.0000 mg | ORAL_TABLET | Freq: Three times a day (TID) | ORAL | 1 refills | Status: DC
Start: 2020-07-02 — End: 2020-07-02

## 2020-07-02 MED ORDER — CLONIDINE 0.1 MG/24HR TD PTWK: 0.1000 mg | MEDICATED_PATCH | TRANSDERMAL | 2 refills | Status: AC

## 2020-07-02 MED ORDER — VITAMIN D3 25 MCG PO TABS: 1000.0000 [IU] | ORAL_TABLET | Freq: Every day | ORAL | 1 refills | Status: DC

## 2020-07-02 MED ORDER — CYANOCOBALAMIN 100 MCG PO TABS: 100.0000 ug | ORAL_TABLET | Freq: Every day | ORAL | 0 refills | Status: AC

## 2020-07-02 MED ORDER — VERAPAMIL HCL ER 240 MG PO TBCR: 240.0000 mg | EXTENDED_RELEASE_TABLET | Freq: Every day | ORAL | 1 refills | Status: DC

## 2020-07-02 MED ORDER — TORSEMIDE 10 MG PO TABS: 10.0000 mg | ORAL_TABLET | Freq: Every day | ORAL | 0 refills | Status: DC

## 2020-07-02 MED ORDER — CYANOCOBALAMIN 100 MCG PO TABS: 100.0000 ug | ORAL_TABLET | Freq: Every day | ORAL | 0 refills | Status: DC

## 2020-07-02 MED ORDER — VENLAFAXINE HCL 37.5 MG PO TABS: 37.5000 mg | ORAL_TABLET | Freq: Two times a day (BID) | ORAL | 0 refills | Status: DC

## 2020-07-02 MED ORDER — MIDODRINE HCL 10 MG PO TABS
10.0000 mg | ORAL_TABLET | Freq: Three times a day (TID) | ORAL | 0 refills | Status: AC
Start: 2020-07-02 — End: 2020-08-01

## 2020-07-02 MED ORDER — MIDODRINE HCL 10 MG PO TABS: 10.0000 mg | ORAL_TABLET | Freq: Three times a day (TID) | ORAL | 0 refills | Status: DC

## 2020-07-02 MED ORDER — TOPIRAMATE 50 MG PO TABS
50.0000 mg | ORAL_TABLET | Freq: Two times a day (BID) | ORAL | 0 refills | Status: DC
Start: 2020-07-02 — End: 2021-03-21

## 2020-07-02 MED ORDER — VITAMIN D3 25 MCG PO TABS
1000.0000 [IU] | ORAL_TABLET | Freq: Every day | ORAL | 0 refills | Status: AC
Start: 2020-07-03 — End: 2020-08-02

## 2020-07-02 MED ORDER — SEVELAMER CARBONATE 800 MG PO TABS: 800.0000 mg | ORAL_TABLET | Freq: Three times a day (TID) | ORAL | 0 refills | Status: AC

## 2020-07-02 MED ORDER — CLONIDINE 0.1 MG/24HR TD PTWK: 0.1000 mg | MEDICATED_PATCH | TRANSDERMAL | 2 refills | Status: DC

## 2020-07-02 NOTE — Progress Notes (Signed)
[facility]  ROUNDING NOTE   Subjective:   Patient seen resting in bed, covers pulled over head Says her headache is worse today Alert and oriented Denies shortness of breath and chest pain Able to eat meals without nausea  Objective:  Vital signs in last 24 hours:  Temp:  [97.8 F (36.6 C)-99.5 F (37.5 C)] 98 F (36.7 C) (02/19 0739) Pulse Rate:  [64-89] 66 (02/19 0739) Resp:  [16-18] 16 (02/19 0739) BP: (133-182)/(58-78) 151/58 (02/19 0739) SpO2:  [98 %-99 %] 98 % (02/19 0739) Weight:  [73.7 kg] 73.7 kg (02/19 0500)  Weight change: 1.9 kg Filed Weights   06/30/20 0500 07/01/20 0551 07/02/20 0500  Weight: 72.9 kg 71.8 kg 73.7 kg    Intake/Output: I/O last 3 completed shifts: In: 7 [P.O.:936] Out: 1500 [Urine:500; Other:1000]   Intake/Output this shift:  No intake/output data recorded.  Physical Exam: General: NAD, resting in bed  Head: Normocephalic, atraumatic. Moist oral mucosal membranes  Eyes: Anicteric  Neck: Supple, trachea midline  Lungs:  Clear bilaterally  Heart: Regular rate and rhythm  Abdomen:  Soft, nontender,   Extremities:  no peripheral edema.  Neurologic: Nonfocal, moving all four extremities  Skin: No lesions  Access: Right IJ Permcath     Basic Metabolic Panel: Recent Labs  Lab 06/27/20 0909 06/28/20 0542 07/01/20 1015  NA 137 138 135  K 2.9* 3.3* 4.6  CL 103 103 104  CO2 '26 27 23  '$ GLUCOSE 94 94 108*  BUN 25* 39* 53*  CREATININE 1.39* 2.22* 3.04*  CALCIUM 8.0* 8.2* 8.0*  MG  --  2.2 2.1  PHOS 2.7  --   --     Liver Function Tests: Recent Labs  Lab 06/27/20 0909  ALBUMIN 2.7*   No results for input(s): LIPASE, AMYLASE in the last 168 hours. No results for input(s): AMMONIA in the last 168 hours.  CBC: Recent Labs  Lab 06/27/20 0909 06/28/20 0542 07/01/20 1015  WBC 5.8 5.4 5.0  HGB 10.6* 10.2* 10.0*  HCT 31.8* 31.8* 30.9*  MCV 94.4 96.1 98.7  PLT 276 274 243    Cardiac Enzymes: No results  for input(s): CKTOTAL, CKMB, CKMBINDEX, TROPONINI in the last 168 hours.  BNP: Invalid input(s): POCBNP  CBG: Recent Labs  Lab 06/26/20 0908  [id number]    Microbiology: Results for orders placed or performed during the hospital encounter of 04/30/20  Resp Panel by RT-PCR (Flu A&B, Covid) Nasopharyngeal Swab     Status: None   Collection Time: 04/30/20  4:01 PM   Specimen: Nasopharyngeal Swab; Nasopharyngeal(NP) swabs in vial transport medium  Result Value Ref Range Status   SARS Coronavirus 2 by RT PCR NEGATIVE NEGATIVE Final    Comment: (NOTE) SARS-CoV-2 target nucleic acids are NOT DETECTED.  The SARS-CoV-2 RNA is generally detectable in upper respiratory specimens during the acute phase of infection. The lowest concentration of SARS-CoV-2 viral copies this assay can detect is 138 copies/mL. A negative result does not preclude SARS-Cov-2 infection and should not be used as the sole basis for treatment or other patient management decisions. A negative result may occur with  improper specimen collection/handling, submission of specimen other than nasopharyngeal swab, presence of viral mutation(s) within the areas targeted by this assay, and inadequate number of viral copies(<138 copies/mL). A negative result must be combined with clinical observations, patient history, and epidemiological information. The expected result is Negative.  Fact Sheet for Patients:  EntrepreneurPulse.com.au  Fact Sheet for Healthcare Providers:  IncredibleEmployment.be  This test is no t yet approved or cleared by the [place] and  has been authorized for detection and/or diagnosis of SARS-CoV-2 by FDA under an Emergency Use Authorization (EUA). This EUA will remain  in effect (meaning this test can be used) for the duration of the COVID-19 declaration under Section 564(b)(1) of the Act, 21 U.S.C.section 360bbb-3(b)(1), unless the authorization is  terminated  or revoked sooner.       Influenza A by PCR NEGATIVE NEGATIVE Final   Influenza B by PCR NEGATIVE NEGATIVE Final    Comment: (NOTE) The Xpert Xpress SARS-CoV-2/FLU/RSV plus assay is intended as an aid in the diagnosis of influenza from Nasopharyngeal swab specimens and should not be used as a sole basis for treatment. Nasal washings and aspirates are unacceptable for Xpert Xpress SARS-CoV-2/FLU/RSV testing.  Fact Sheet for Patients: EntrepreneurPulse.com.au  Fact Sheet for Healthcare Providers: IncredibleEmployment.be  This test is not yet approved or cleared by the [place] FDA and has been authorized for detection and/or diagnosis of SARS-CoV-2 by FDA under an Emergency Use Authorization (EUA). This EUA will remain in effect (meaning this test can be used) for the duration of the COVID-19 declaration under Section 564(b)(1) of the Act, 21 U.S.C. section 360bbb-3(b)(1), unless the authorization is terminated or revoked.  Performed at [facility], [address] [place], [place] [address]   Body fluid culture     Status: None   Collection Time: 05/02/20  4:44 PM   Specimen: PATH Cytology Pleural fluid  Result Value Ref Range Status   Specimen Description   Final    PLEURAL Performed at [facility], [address] [place], [place] [address]    Special Requests   Final    NONE Performed at [facility], [address] [place], [place] [address]    Gram Stain   Final    MODERATE WBC PRESENT, PREDOMINANTLY MONONUCLEAR NO ORGANISMS SEEN    Culture   Final    NO GROWTH Performed at [facility], [place] [address] [place], [place] [address]    Report Status 05/06/2020 FINAL  Final  MRSA PCR Screening     Status: None   Collection Time: 05/16/20  9:29 PM   Specimen: Nasal Mucosa; Nasopharyngeal  Result Value Ref Range Status   MRSA by PCR NEGATIVE NEGATIVE Final    Comment:         The GeneXpert MRSA Assay (FDA approved for NASAL specimens only), is one component of a comprehensive MRSA colonization surveillance program. It is not intended to diagnose MRSA infection nor to guide or monitor treatment for MRSA infections. Performed at [facility], [address] [place], [place] [address]     Coagulation Studies: No results for input(s): LABPROT, INR in the last 72 hours.  Urinalysis: No results for input(s): COLORURINE, LABSPEC, PHURINE, GLUCOSEU, HGBUR, BILIRUBINUR, KETONESUR, PROTEINUR, UROBILINOGEN, NITRITE, LEUKOCYTESUR in the last 72 hours.  Invalid input(s): APPERANCEUR    Imaging: No results found.   Medications:   . sodium chloride    . albumin human 25 g (07/01/20 1018)   . carvedilol  25 mg Oral BID WC  . Chlorhexidine Gluconate Cloth  6 each Topical Q0600  . cholecalciferol  1,000 Units Oral Daily  . cloNIDine  0.1 mg Transdermal Weekly  . epoetin (EPOGEN/PROCRIT) injection  4,000 Units Intravenous Q M,W,F-HD  . feeding supplement  237 mL Oral BID BM  . heparin  5,000 Units Subcutaneous  Q8H  . melatonin  5 mg Oral QPM  . midodrine  10 mg Oral TID WC  . multivitamin  1 tablet Oral QHS  . polyethylene glycol  17 g Oral Daily  . senna-docusate  1 tablet Oral BID  . sevelamer carbonate  800 mg Oral TID WC  . sodium chloride flush  3 mL Intravenous Q12H  . topiramate  50 mg Oral BID  . torsemide  10 mg Oral Daily  . traZODone  50 mg Oral QPM  . venlafaxine  37.5 mg Oral BID WC  . verapamil  240 mg Oral QHS  . vitamin B-12  100 mcg Oral Daily   sodium chloride, acetaminophen **OR** acetaminophen, ALPRAZolam, benzonatate, bisacodyl, bisacodyl, diphenhydrAMINE, lidocaine, ondansetron **OR** ondansetron (ZOFRAN) IV, prochlorperazine, sodium chloride flush, [COMPLETED] tiZANidine **FOLLOWED BY** tiZANidine  Assessment/ Plan:  [name] is a 65 y.o.  female female with diastolic congestive heart failure, HTN, DM type  II, CVA was admitted on 04/30/20 for volume overload.   Patient moved from [place] in December 2021. She was following with nephrology there. Patient initiated on hemodialysis this admission. Patient has a prolonged hospitalization. She was initially admitted with volume overload and acute exacerbation of congestive heart failure. Failed IV diuretics and started on hemodialysis on 12/29. No outpatient dialysis due to patient's lack of insurance and not a resident of the state of [place]. Held dialysis for possible recovery however after several days, creatinine rising and volume accumulation. Patient then resumed on dialysis. She has been weaned down to twice a week  1. ESRD on Monday ,Friday dialysis  --Received dialysis yesterday -Removed 1L of fluid -Outpatient treatment- [facility] [address]  2. Hypertension with CKD  - BP  elevated but stable -current medications offer improved control  3. Secondary Hyperparathyroidism of renal origin   - Currently prescribed Renvela   4. Anemia of CKD  -HGB currently 10.0 -EPO given with dialysis   LOS: 62 Colon Flattery, NP 2/19/202212:35 PM

## 2020-07-02 NOTE — Discharge Summary (Addendum)
Katie [name] [id number] DOB: [birth year] DOA: 04/30/2020  PCP: Patient, [name]  Admit date: 04/30/2020 Discharge date: 07/02/2020  Admitted From: home Disposition:  home  Recommendations for Outpatient Follow-up:  1. Follow up with PCP in 1 week 2. Please obtain BMP/CBC in one week 3. Nephrology in one week 4. Hemodialysis accepted at [facility] TTS 6:45 and can start on Tuesday 2/22 at 6:15. Patient did confirm that her friend could transport until transportation could be arranged through the clinic.  5. Cardiology Dr. Saunders Revel in 1 week  6. Neurology Dr. Manuella Ghazi in 1-2 weeks  Home Health: Yes   Discharge Condition:Stable CODE STATUS: Full Diet recommendation: Heart Healthy renal diet Brief/Interim Summary: Per HPI: Please note patient has had a 63-day hospital stay and discharge based on review of record. Katie [name] a 65 y.o.female with a history of diabetes mellitus type 2, stage IV CKD, heart failure, hypertension. Patient presented secondary to dyspnea with evidence of volume overload and pleural effusion.Hospital stay has been prolonged due to disposition challenges and no safe discharge plan, due to lack of insurance difficulty with SNF placement in patient too profoundly weak to safely return home.    Headache, persistent / History of chronic migraine headaches / Medication Overuse Headaches-patient reportedusing Imitrex as needed. Has been transitioned entirely off opioids.  MRI brain, MRA, MRV were unremarkable --Neurology consulted, recommendations: --d/c Imitrex (has overused) --continue prophylactics: venlafaxine, Topamax  Outpatient neurology follow-up for nerve blocks, Botox, CGRP inhibitor consideration        Acute on chronic diastolic CHF-echo in December 2021 showed normal EF and grade 2 diastolic dysfunction, associated RV systolic dysfunction and elevated PA pressure seen on TTE however right heart cath on 12/28 significantly for  mildly elevated pulmonary artery pressures. Presented volume overloaded, initially managed with IV Lasix but had worsening renal function. Unfortunately her CKD has progressed to ESRD and now on dialysis.  Continue torsemide '10mg'$  qd    ESRD-unknown baseline renal function. Patient unsure primary nephrologist name in [place]. Presented with creatinine 2.97. Patient reported plan was to start dialysis and consideration of future renal transplant. Renal function worsened over course of admission secondary to diuresis for acute CHF and volume overload. She has been started on dialysis.  HD dialysis on TTS. First HD on Tuesday as above.  Orthostatic hypotension-likely secondary to autonomic dysfunction. Has baseline elevated blood pressures. Was tried on midodrine which had to be stopped due to systolic pressures severely elevated 190s to 200s. --TED hose and abdominal binder when oob/mobility This has been an issue, needs to be on Midodrine on dialysis days. On 2/18 -during dialysis had to get albumin due to low BP with systolic at 83.  now stable and asx.      Pleural effusion, right side-likely due to volume overload in the setting of diastolic dysfunction and renal failure. Status post right thoracentesis on 12/20.  No evidence of malignancy was seen on cytology       Pericardial effusion, moderate-seen on echo, no evidence of tamponade. No recent infections to attribute this to. In the setting of CKD 5 and elevated BUN, likely secondary to uremia.  Cardiology recommended outpatient echo for surveillance. F/u with cardiology as outpatient   Essential hypertension- with labile BP's, orthostatic hypotension. PTA was on multiple agents including Coreg, Cozaar, torsemide. Had been tried on midodrine for orthostasis but stopped due to severely elevated pressures.  Discharge on bp meds used here.   Anemia of chronic renal disease-with macrocytosis on  CBC. TIBC low but otherwise normal iron studies. Vitamin B12 level 358. Continue EPO with dialysis per renal. Monitor CBC.   History of type 2 diabetes-last A1c 4.7%, not currently diabetic and CBGs well controlled.    History of tobacco abuse-quit at least 10 years ago, prior 20-pack-year history.   Insomnia-continue melatonin and trazodone.   Pressure injury, mid coccyx-present on admission. Pressure Injury 04/30/20 Coccyx Mid Stage 2 - Partial thickness loss of dermis presenting as a shallow open injury with a red, pink wound bed without slough. (Active)  04/30/20 2130  Location: Coccyx  Location Orientation: Mid  Staging: Stage 2 - Partial thickness loss of dermis presenting as a shallow open injury with a red, pink wound bed without slough.  Wound Description (Comments):   Present on Admission: Yes       Discharge Diagnoses:  Principal Problem:   Volume overload Active Problems:   Type 2 diabetes mellitus with stage 5 chronic kidney disease (HCC)   Hypertension   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HCC)   Hyperkalemia   Pressure injury of skin   Pleural effusion   Acute heart failure with preserved ejection fraction (HFpEF) (HCC)   Chronic kidney disease (CKD), stage IV (severe) ([place])   Pulmonary hypertension, unspecified (HCC)   ATN (acute tubular necrosis) (HCC)   Persistent headaches    Discharge Instructions  Discharge Instructions    Diet - low sodium heart healthy   Complete by: As directed    Discharge instructions   Complete by: As directed    Follow up with dialysis on Tuesday F/u with pcp in one week F/u with nephrology   Increase activity slowly   Complete by: As directed    No wound care   Complete by: As directed      Allergies as of 07/02/2020      Reactions   Penicillins Nausea And Vomiting   Pt states "only pills" make her vomit      Medication List    STOP taking these medications   cloNIDine 0.1 MG  tablet Commonly known as: CATAPRES   DULoxetine 30 MG capsule Commonly known as: CYMBALTA   labetalol 100 MG tablet Commonly known as: NORMODYNE   loperamide 2 MG capsule Commonly known as: IMODIUM   metoprolol succinate 100 MG 24 hr tablet Commonly known as: TOPROL-XL   metoprolol tartrate 25 MG tablet Commonly known as: LOPRESSOR   NIFEdipine 90 MG 24 hr tablet Commonly known as: PROCARDIA XL/NIFEDICAL-XL   nortriptyline 10 MG capsule Commonly known as: PAMELOR   Nurtec 75 MG Tbdp Generic drug: Rimegepant Sulfate   pantoprazole 40 MG tablet Commonly known as: PROTONIX   sodium bicarbonate 650 MG tablet   valsartan 160 MG tablet Commonly known as: DIOVAN   valsartan-hydrochlorothiazide 160-25 MG tablet Commonly known as: DIOVAN-HCT     TAKE these medications   carvedilol 25 MG tablet Commonly known as: COREG Take 1 tablet (25 mg total) by mouth 2 (two) times daily with a meal.   cetirizine 10 MG tablet Commonly known as: ZYRTEC Take 10 mg by mouth daily.   cloNIDine 0.1 mg/24hr patch Commonly known as: CATAPRES - Dosed in mg/24 hr Place 1 patch (0.1 mg total) onto the skin once a week for 7 days. Start taking on: July 05, 2020   cyanocobalamin 100 MCG tablet Take 1 tablet (100 mcg total) by mouth daily. Start taking on: February 20, 123456   folic acid 1 MG tablet Commonly  known as: FOLVITE Take 1 mg by mouth daily.   midodrine 10 MG tablet Commonly known as: PROAMATINE Take 1 tablet (10 mg total) by mouth 3 (three) times daily with meals.   multivitamin Tabs tablet Take 1 tablet by mouth at bedtime.   senna-docusate 8.6-50 MG tablet Commonly known as: Senokot-S Take 1 tablet by mouth 2 (two) times daily for 7 days.   sevelamer carbonate 800 MG tablet Commonly known as: RENVELA Take 1 tablet (800 mg total) by mouth 3 (three) times daily with meals.   topiramate 50 MG tablet Commonly known as: TOPAMAX Take 1 tablet (50 mg total) by mouth  2 (two) times daily. What changed:   medication strength  how much to take   torsemide 10 MG tablet Commonly known as: DEMADEX Take 1 tablet (10 mg total) by mouth daily. Start taking on: July 03, 2020   traZODone 50 MG tablet Commonly known as: DESYREL Take 1 tablet (50 mg total) by mouth every evening.   venlafaxine 37.5 MG tablet Commonly known as: EFFEXOR Take 1 tablet (37.5 mg total) by mouth 2 (two) times daily with a meal.   verapamil 240 MG CR tablet Commonly known as: CALAN-SR Take 1 tablet (240 mg total) by mouth at bedtime. What changed:   medication strength  how much to take   Vitamin D3 25 MCG tablet Commonly known as: Vitamin D Take 1 tablet (1,000 Units total) by mouth daily. Start taking on: July 03, 2020            Durable Medical Equipment  (From admission, onward)         Start     Ordered   06/30/20 1048  For home use only DME wheelchair cushion (seat and back)  Once        06/30/20 1047   06/30/20 1048  For home use only DME 3 n 1  Once        06/30/20 1047   06/30/20 1048  For home use only DME Walker rolling  Once       Question Answer Comment  Walker: With [place] Wheels   Patient needs a walker to treat with the following condition Weakness      06/30/20 1047          Follow-up Information    Kolluru, Lurena Nida, MD In 1 week.   Specialty: Nephrology Why: patient to make own follow up appointment Contact information: [address] [name] [address] [phone]        End, Harrell Gave, MD In 1 week.   Specialty: Cardiology Why: patient to make own follow up appointment for an echo Contact information: [place] [address] [phone]        Vladimir Crofts, MD Follow up in 2 week(s).   Specialty: Neurology Contact information: [place] [facility] [place] [address] [phone]              Allergies   Allergen Reactions  . Penicillins Nausea And Vomiting    Pt states "only pills" make her vomit    Consultations:  Cardiology, nephrology, vascular surgery   Procedures/Studies: DG Chest 1 View  Result Date: 06/29/2020 CLINICAL DATA:  Weakness. EXAM: CHEST  1 VIEW COMPARISON:  May 08, 2020. FINDINGS: Stable cardiomegaly. Mild central pulmonary vascular congestion is noted. Interval placement of right internal jugular dialysis catheter with distal tip in expected position of cavoatrial junction. No pneumothorax  is noted. Bibasilar atelectasis and pleural effusions are noted, right greater than left. Bony thorax is unremarkable. IMPRESSION: Bibasilar atelectasis and pleural effusions, right greater than left. Mild central pulmonary vascular congestion is noted. Electronically Signed   By: Marijo Conception M.D.   On: 06/29/2020 14:17   MR ANGIO HEAD WO CONTRAST  Result Date: 06/25/2020 CLINICAL DATA:  65 year old female with history of diabetes with complications. Unexplained headache. EXAM: MRI HEAD WITHOUT CONTRAST MRA HEAD WITHOUT CONTRAST MRV HEAD WITHOUT CONTRAST TECHNIQUE: Multiplanar, multiecho pulse sequences of the brain and surrounding structures were obtained without intravenous contrast. Angiographic images of the head were obtained using MRA and MRV technique without contrast. COMPARISON:  None. FINDINGS: MRI HEAD FINDINGS Brain: Cerebral volume appears within normal limits for age. No restricted diffusion to suggest acute infarction. No midline shift, mass effect, evidence of mass lesion, ventriculomegaly, extra-axial collection or acute intracranial hemorrhage. Cervicomedullary junction and pituitary are within normal limits. [place] Cables and white matter signal is largely normal for age throughout the brain. No cortical encephalomalacia identified. There does appear to be a chronic microhemorrhage in the left caudate nucleus (series 13, image 26). Additional probable mild perivascular  spaces in the deep gray matter. Vascular: Major intracranial vascular flow voids are preserved. Skull and upper cervical spine: Mild for age visible cervical spine degeneration. Visualized bone marrow signal is within normal limits. Sinuses/Orbits: Negative orbits.  Paranasal sinuses are clear. Other: Mastoids are clear. Visible internal auditory structures appear normal. Scalp and face appear negative. MRA HEAD FINDINGS Antegrade flow in the posterior circulation with fairly codominant distal vertebral arteries. No distal vertebral stenosis. Normal left PICA origin. Right AICA may be dominant. Both AICA origins are patent. Patent vertebrobasilar junction and basilar artery without stenosis. Patent SCA and PCA origins. Both posterior communicating arteries are present. Bilateral PCA branches appear normal. Antegrade flow in both ICA siphons. No siphon stenosis. Ophthalmic and posterior communicating artery origins appear normal. Patent carotid termini. Normal MCA and ACA origins. Diminutive anterior communicating artery. MCA M1 segments and MCA bifurcations appear patent without stenosis. Visible bilateral MCA and ACA branches are within normal limits. MRV HEAD FINDINGS 2D time-of-flight MRV imaging demonstrates preserved flow signal in the superior sagittal sinus, torcula, straight sinus, vein of Galen, internal cerebral veins and basal veins of Rosenthal. Preserved flow signal in bilateral transverse and sigmoid sinuses and bilateral IJ bulbs, those on the left left appear slightly dominant. And preserved flow signal in the major cortical draining veins. IMPRESSION: 1. No acute intracranial abnormality and largely normal for age non-contrast MRI appearance of the brain; solitary chronic micro-hemorrhage in the left caudate nucleus. 2. Intracranial MRA and MRV are normal. Electronically Signed   By: Genevie Ann M.D.   On: 06/25/2020 11:08   MR BRAIN WO CONTRAST  Result Date: 06/25/2020 CLINICAL DATA:  65 year old  female with history of diabetes with complications. Unexplained headache. EXAM: MRI HEAD WITHOUT CONTRAST MRA HEAD WITHOUT CONTRAST MRV HEAD WITHOUT CONTRAST TECHNIQUE: Multiplanar, multiecho pulse sequences of the brain and surrounding structures were obtained without intravenous contrast. Angiographic images of the head were obtained using MRA and MRV technique without contrast. COMPARISON:  None. FINDINGS: MRI HEAD FINDINGS Brain: Cerebral volume appears within normal limits for age. No restricted diffusion to suggest acute infarction. No midline shift, mass effect, evidence of mass lesion, ventriculomegaly, extra-axial collection or acute intracranial hemorrhage. Cervicomedullary junction and pituitary are within normal limits. Pear[place] Cables and white matter signal is largely normal for age  throughout the brain. No cortical encephalomalacia identified. There does appear to be a chronic microhemorrhage in the left caudate nucleus (series 13, image 26). Additional probable mild perivascular spaces in the deep gray matter. Vascular: Major intracranial vascular flow voids are preserved. Skull and upper cervical spine: Mild for age visible cervical spine degeneration. Visualized bone marrow signal is within normal limits. Sinuses/Orbits: Negative orbits.  Paranasal sinuses are clear. Other: Mastoids are clear. Visible internal auditory structures appear normal. Scalp and face appear negative. MRA HEAD FINDINGS Antegrade flow in the posterior circulation with fairly codominant distal vertebral arteries. No distal vertebral stenosis. Normal left PICA origin. Right AICA may be dominant. Both AICA origins are patent. Patent vertebrobasilar junction and basilar artery without stenosis. Patent SCA and PCA origins. Both posterior communicating arteries are present. Bilateral PCA branches appear normal. Antegrade flow in both ICA siphons. No siphon stenosis. Ophthalmic and posterior communicating artery origins appear normal. Patent  carotid termini. Normal MCA and ACA origins. Diminutive anterior communicating artery. MCA M1 segments and MCA bifurcations appear patent without stenosis. Visible bilateral MCA and ACA branches are within normal limits. MRV HEAD FINDINGS 2D time-of-flight MRV imaging demonstrates preserved flow signal in the superior sagittal sinus, torcula, straight sinus, vein of Galen, internal cerebral veins and basal veins of Rosenthal. Preserved flow signal in bilateral transverse and sigmoid sinuses and bilateral IJ bulbs, those on the left left appear slightly dominant. And preserved flow signal in the major cortical draining veins. IMPRESSION: 1. No acute intracranial abnormality and largely normal for age non-contrast MRI appearance of the brain; solitary chronic micro-hemorrhage in the left caudate nucleus. 2. Intracranial MRA and MRV are normal. Electronically Signed   By: Genevie Ann M.D.   On: 06/25/2020 11:08   MR MRV HEAD WO CM  Result Date: 06/25/2020 CLINICAL DATA:  65 year old female with history of diabetes with complications. Unexplained headache. EXAM: MRI HEAD WITHOUT CONTRAST MRA HEAD WITHOUT CONTRAST MRV HEAD WITHOUT CONTRAST TECHNIQUE: Multiplanar, multiecho pulse sequences of the brain and surrounding structures were obtained without intravenous contrast. Angiographic images of the head were obtained using MRA and MRV technique without contrast. COMPARISON:  None. FINDINGS: MRI HEAD FINDINGS Brain: Cerebral volume appears within normal limits for age. No restricted diffusion to suggest acute infarction. No midline shift, mass effect, evidence of mass lesion, ventriculomegaly, extra-axial collection or acute intracranial hemorrhage. Cervicomedullary junction and pituitary are within normal limits. Pear[place] Cables and white matter signal is largely normal for age throughout the brain. No cortical encephalomalacia identified. There does appear to be a chronic microhemorrhage in the left caudate nucleus (series 13,  image 26). Additional probable mild perivascular spaces in the deep gray matter. Vascular: Major intracranial vascular flow voids are preserved. Skull and upper cervical spine: Mild for age visible cervical spine degeneration. Visualized bone marrow signal is within normal limits. Sinuses/Orbits: Negative orbits.  Paranasal sinuses are clear. Other: Mastoids are clear. Visible internal auditory structures appear normal. Scalp and face appear negative. MRA HEAD FINDINGS Antegrade flow in the posterior circulation with fairly codominant distal vertebral arteries. No distal vertebral stenosis. Normal left PICA origin. Right AICA may be dominant. Both AICA origins are patent. Patent vertebrobasilar junction and basilar artery without stenosis. Patent SCA and PCA origins. Both posterior communicating arteries are present. Bilateral PCA branches appear normal. Antegrade flow in both ICA siphons. No siphon stenosis. Ophthalmic and posterior communicating artery origins appear normal. Patent carotid termini. Normal MCA and ACA origins. Diminutive anterior communicating artery. MCA M1 segments  and MCA bifurcations appear patent without stenosis. Visible bilateral MCA and ACA branches are within normal limits. MRV HEAD FINDINGS 2D time-of-flight MRV imaging demonstrates preserved flow signal in the superior sagittal sinus, torcula, straight sinus, vein of Galen, internal cerebral veins and basal veins of Rosenthal. Preserved flow signal in bilateral transverse and sigmoid sinuses and bilateral IJ bulbs, those on the left left appear slightly dominant. And preserved flow signal in the major cortical draining veins. IMPRESSION: 1. No acute intracranial abnormality and largely normal for age non-contrast MRI appearance of the brain; solitary chronic micro-hemorrhage in the left caudate nucleus. 2. Intracranial MRA and MRV are normal. Electronically Signed   By: Genevie Ann M.D.   On: 06/25/2020 11:08      Subjective: Feeling  better, excited going home  Discharge Exam: Vitals:   07/02/20 0739 07/02/20 1520  BP: (!) 151/58 (!) 160/76  Pulse: 66 78  Resp: 16 14  Temp: 98 F (36.7 C) 99.2 F (37.3 C)  SpO2: 98% 98%   Vitals:   07/02/20 0500 07/02/20 0546 07/02/20 0739 07/02/20 1520  BP:  135/66 (!) 151/58 (!) 160/76  Pulse:  64 66 78  Resp:  '16 16 14  '$ Temp:  97.8 F (36.6 C) 98 F (36.7 C) 99.2 F (37.3 C)  TempSrc:  Oral Oral Oral  SpO2:  99% 98% 98%  Weight: 73.7 kg     Height:        General: Pt is alert, awake, not in acute distress Cardiovascular: RRR, S1/S2 +, no rubs, no gallops Respiratory: CTA bilaterally, no wheezing, no rhonchi Abdominal: Soft, NT, ND, bowel sounds + Extremities: no edema    The results of significant diagnostics from this hospitalization (including imaging, microbiology, ancillary and laboratory) are listed below for reference.     Microbiology: No results found for this or any previous visit (from the past 240 hour(s)).   Labs: BNP (last 3 results) Recent Labs    04/30/20 1601  BNP 0000000*   Basic Metabolic Panel: Recent Labs  Lab 06/27/20 0909 06/28/20 0542 07/01/20 1015  NA 137 138 135  K 2.9* 3.3* 4.6  CL 103 103 104  CO2 '26 27 23  '$ GLUCOSE 94 94 108*  BUN 25* 39* 53*  CREATININE 1.39* 2.22* 3.04*  CALCIUM 8.0* 8.2* 8.0*  MG  --  2.2 2.1  PHOS 2.7  --   --    Liver Function Tests: Recent Labs  Lab 06/27/20 0909  ALBUMIN 2.7*   No results for input(s): LIPASE, AMYLASE in the last 168 hours. No results for input(s): AMMONIA in the last 168 hours. CBC: Recent Labs  Lab 06/27/20 0909 06/28/20 0542 07/01/20 1015  WBC 5.8 5.4 5.0  HGB 10.6* 10.2* 10.0*  HCT 31.8* 31.8* 30.9*  MCV 94.4 96.1 98.7  PLT 276 274 243   Cardiac Enzymes: No results for input(s): CKTOTAL, CKMB, CKMBINDEX, TROPONINI in the last 168 hours. BNP: Invalid input(s): POCBNP CBG: Recent Labs  Lab 06/26/20 0908  GLUCAP 84   D-Dimer No results for  input(s): DDIMER in the last 72 hours. Hgb A1c No results for input(s): HGBA1C in the last 72 hours. Lipid Profile No results for input(s): CHOL, HDL, LDLCALC, TRIG, CHOLHDL, LDLDIRECT in the last 72 hours. Thyroid function studies No results for input(s): TSH, T4TOTAL, T3FREE, THYROIDAB in the last 72 hours.  Invalid input(s): FREET3 Anemia work up No results for input(s): VITAMINB12, FOLATE, FERRITIN, TIBC, IRON, RETICCTPCT in the last 72 hours. Urinalysis  Component Value Date/Time   COLORURINE YELLOW (A) 05/03/2020 1158   APPEARANCEUR CLEAR (A) 05/03/2020 1158   LABSPEC 1.011 05/03/2020 1158   PHURINE 5.0 05/03/2020 1158   GLUCOSEU 50 (A) 05/03/2020 1158   HGBUR SMALL (A) 05/03/2020 1158   BILIRUBINUR NEGATIVE 05/03/2020 1158   KETONESUR NEGATIVE 05/03/2020 1158   PROTEINUR >=300 (A) 05/03/2020 1158   NITRITE NEGATIVE 05/03/2020 1158   LEUKOCYTESUR NEGATIVE 05/03/2020 1158   Sepsis Labs Invalid input(s): PROCALCITONIN,  WBC,  LACTICIDVEN Microbiology No results found for this or any previous visit (from the past 240 hour(s)).   Time coordinating discharge: Over 30 minutes  SIGNED:   Nolberto Hanlon, MD  [facility] 07/02/2020, 3:44 PM Pager   If 7PM-7AM, please contact night-coverage [email or web address] Password [email or web address]

## 2020-07-02 NOTE — TOC Progression Note (Signed)
Transition of Care [facility]) - Progression Note    Patient Details  Name: Katie [name] MRN: [id number] Date of Birth: April 27, 1956  Transition of [facility] CM/SW Contact  Truitt Merle, LCSW Phone Number: 07/02/2020, 4:10 PM  Clinical Narrative:    Patient ready for discharge home today. Cory with [facility] updated of patient discharge. Charity wheelchair delivered to room after unsuccessful attempt through [place]. Friend to pick up wheelchair from hospital. EMS arranged. RN Melissa updated. No further TOC needs.   Expected Discharge Plan: Skilled Nursing Facility Barriers to Discharge: Continued Medical Work up,Homeless with medical needs  Expected Discharge Plan and Services Expected Discharge Plan: [place] In-house Referral: Clinical Social Work Discharge Planning Services: Homebound not met per provider Post Acute Care Choice: [facility] Living arrangements for the past 2 months: No permanent address (Lived with Sister and family) Expected Discharge Date: 07/02/20               DME Arranged: N/A DME Agency: NA       HH Arranged: NA HH Agency: NA         Social Determinants of Health (SDOH) Interventions    Readmission Risk Interventions No flowsheet data found.

## 2020-07-02 NOTE — Progress Notes (Signed)
Pt is now requesting all DC meds be resent to [facility] in [place] [address]. secured chat MD Kurtis Bushman

## 2020-07-02 NOTE — Progress Notes (Signed)
Occupational Therapy Treatment Patient Details Name: Katie [name] MRN: [id number] DOB: 1955/09/22 Today's Date: 07/02/2020    History of present illness Patient is a 65 y.o. female with a history of diabetes mellitus type 2, stage IV CKD, heart failure, hypertension. Patient presented secondary to dyspnea with evidence of volume overload and pleural effusion. Diagnosed with moderate pericardial effusion, mild to moderate aortic insufficiency and aortic stenosis.  S/p R heart cath 12/28.  R temp fem cath placed 12/29; 1/3 R IJ permcath placed; 1/4 R temp fem cath removed.   OT comments  Pt seen for OT treatment this date to f/u re: safety with ADLs/ADL mobility. Pt participates in sup to sit with MOD I, requires SETUP to MIN A with UB bathing/shampooing. Pt with c/o back and neck pain and OT engages pt in gentle massage/soft tissue mobilization to increase circulation and promote healing/pain relief. Pt reports feeling pain reduced to 4/10 from 5-6/10 at start of session. Pt requires SUPV for sit<>Stand with RW to optimize positioning. OT engages pt in below-listed exercises with red resistance band. Pt left in bed with all needs met and in reach. Will continue to follow.   Follow Up Recommendations  Home health OT;Supervision - Intermittent    Equipment Recommendations  3 in 1 bedside commode;Tub/shower seat;Other (comment) (wheelchair, 2ww)    Recommendations for Other Services      Precautions / Restrictions Precautions Precautions: Fall Precaution Comments: R IJ permcath; monitor orthostatics Restrictions Weight Bearing Restrictions: No Other Position/Activity Restrictions: Pt Orthostatic, monitor BP with change of position       Mobility Bed Mobility Overal bed mobility: Modified Independent                Transfers Overall transfer level: Needs assistance Equipment used: Rolling walker (2 wheeled) Transfers: Sit to/from Stand Sit to Stand: Supervision          General transfer comment: SUPV for safety, some dizziness endorsed with initial positional change    Balance Overall balance assessment: Needs assistance Sitting-balance support: Feet supported Sitting balance-Leahy Scale: Good Sitting balance - Comments: good balance, although pt endorses dizziness   Standing balance support: Bilateral upper extremity supported Standing balance-Leahy Scale: Fair Standing balance comment: no LOB with BUE support                           ADL either performed or assessed with clinical judgement   ADL Overall ADL's : Needs assistance/impaired     Grooming: Set up;Minimal assistance;Sitting Grooming Details (indicate cue type and reason): EOB to wash/dry hair         Upper Body Dressing : Modified independent;Sitting                     General ADL Comments: Pt endorses dizziness with postional change     Vision Baseline Vision/History: Wears glasses;Retinopathy Wears Glasses: At all times Patient Visual Report: No change from baseline     Perception     Praxis      Cognition Arousal/Alertness: Awake/alert Behavior During Therapy: WFL for tasks assessed/performed Overall Cognitive Status: Within Functional Limits for tasks assessed                                          Exercises Other Exercises Other Exercises: OT facilitates seated UB bathing-washing hair with SETUP to MIN  A. In addition, OT engages pt in theraband exercise with red band for one set x10 reps horizontal abd and diagonal abduction for 10 per side (self-anchored resistance). Seated upper back/neck gentle massage/tissue mobilization to increase circulation and promote healing. Lastly, sit<>stand with RW with SUPV to optimize positioning.   Shoulder Instructions       General Comments      Pertinent Vitals/ Pain       Pain Assessment: 0-10 Pain Score: 5  Pain Location: chronic neck and upper back pain Pain Descriptors /  Indicators: Tightness Pain Intervention(s): Monitored during session;Utilized relaxation techniques;Other (comment) (massage/tissue mobilization)  Home Living                                          Prior Functioning/Environment              Frequency  Min 2X/week        Progress Toward Goals  OT Goals(current goals can now be found in the care plan section)  Progress towards OT goals: Progressing toward goals  Acute Rehab OT Goals Patient Stated Goal: to go home (friends house) when safe OT Goal Formulation: With patient Time For Goal Achievement: 07/05/20 Potential to Achieve Goals: Good  Plan Frequency remains appropriate;Discharge plan needs to be updated    Co-evaluation                 AM-PAC OT "6 Clicks" Daily Activity     Outcome Measure   Help from another person eating meals?: None Help from another person taking care of personal grooming?: None Help from another person toileting, which includes using toliet, bedpan, or urinal?: A Little Help from another person bathing (including washing, rinsing, drying)?: A Lot Help from another person to put on and taking off regular upper body clothing?: A Little Help from another person to put on and taking off regular lower body clothing?: A Lot 6 Click Score: 18    End of Session Equipment Utilized During Treatment: Rolling walker  OT Visit Diagnosis: Unsteadiness on feet (R26.81);History of falling (Z91.81);Pain;Muscle weakness (generalized) (M62.81)   Activity Tolerance Patient tolerated treatment well   Patient Left in bed;with call bell/phone within reach   Nurse Communication Mobility status        Time: 5947-0761 OT Time Calculation (min): 44 min  Charges: OT General Charges $OT Visit: 1 Visit OT Treatments $Self Care/Home Management : 8-22 mins $Therapeutic Exercise: 8-22 mins $Massage: 8-22 mins  Gerrianne Scale, MS, OTR/L ascom [phone] 07/02/20, 12:54  PM

## 2020-07-05 NOTE — TOC Progression Note (Signed)
Transition of Care [facility]) - Progression Note    Patient Details  Name: Katie [name] MRN: [id number] Date of Birth: 24-Nov-1955  Transition of [facility]) CM/SW Contact  Anselm Pancoast, RN Phone Number: 07/05/2020, 8:20 AM  Clinical Narrative:     Received call from patient after discharge reporting she did not have all needed medication at pharmacy. Unable to confirm which medication she was missing but reported she was missing one. Patient has dialysis appointment today and plans to review her medication with RN at clinic.    Expected Discharge Plan: Skilled Nursing Facility Barriers to Discharge: Continued Medical Work up,Homeless with medical needs  Expected Discharge Plan and Services Expected Discharge Plan: [place] In-house Referral: Clinical Social Work Discharge Planning Services: Homebound not met per provider Post Acute Care Choice: [place] Living arrangements for the past 2 months: No permanent address (Lived with Sister and family) Expected Discharge Date: 07/02/20               DME Arranged: N/A DME Agency: NA       HH Arranged: NA HH Agency: NA         Social Determinants of Health (SDOH) Interventions    Readmission Risk Interventions No flowsheet data found.

## 2020-08-19 ENCOUNTER — Other Ambulatory Visit (INDEPENDENT_AMBULATORY_CARE_PROVIDER_SITE_OTHER): Payer: Self-pay | Admitting: Vascular Surgery

## 2020-08-19 DIAGNOSIS — N186 End stage renal disease: Secondary | ICD-10-CM

## 2020-08-22 ENCOUNTER — Encounter (INDEPENDENT_AMBULATORY_CARE_PROVIDER_SITE_OTHER): Payer: Self-pay

## 2020-08-22 ENCOUNTER — Other Ambulatory Visit (INDEPENDENT_AMBULATORY_CARE_PROVIDER_SITE_OTHER): Payer: MEDICAID

## 2020-08-22 ENCOUNTER — Encounter (INDEPENDENT_AMBULATORY_CARE_PROVIDER_SITE_OTHER): Payer: Self-pay | Admitting: Vascular Surgery

## 2020-08-23 ENCOUNTER — Other Ambulatory Visit: Payer: Self-pay

## 2020-08-23 ENCOUNTER — Emergency Department
Admission: EM | Admit: 2020-08-23 | Discharge: 2020-08-23 | Disposition: A | Discharge status: Home / Self Care | Payer: Medicaid Other | Attending: Emergency Medicine | Admitting: Emergency Medicine

## 2020-08-23 DIAGNOSIS — E1122 Type 2 diabetes mellitus with diabetic chronic kidney disease: Secondary | ICD-10-CM | POA: Insufficient documentation

## 2020-08-23 DIAGNOSIS — I132 Hypertensive heart and chronic kidney disease with heart failure and with stage 5 chronic kidney disease, or end stage renal disease: Secondary | ICD-10-CM | POA: Insufficient documentation

## 2020-08-23 DIAGNOSIS — N186 End stage renal disease: Secondary | ICD-10-CM | POA: Insufficient documentation

## 2020-08-23 DIAGNOSIS — R519 Headache, unspecified: Secondary | ICD-10-CM | POA: Insufficient documentation

## 2020-08-23 DIAGNOSIS — Z79899 Other long term (current) drug therapy: Secondary | ICD-10-CM | POA: Diagnosis not present

## 2020-08-23 DIAGNOSIS — I5033 Acute on chronic diastolic (congestive) heart failure: Secondary | ICD-10-CM | POA: Diagnosis not present

## 2020-08-23 DIAGNOSIS — Z87891 Personal history of nicotine dependence: Secondary | ICD-10-CM | POA: Diagnosis not present

## 2020-08-23 DIAGNOSIS — Z992 Dependence on renal dialysis: Secondary | ICD-10-CM | POA: Diagnosis not present

## 2020-08-23 LAB — CBC WITH DIFFERENTIAL/PLATELET
Abs Immature Granulocytes: 0.06 10*3/uL (ref 0.00–0.07)
Basophils Absolute: 0.1 10*3/uL (ref 0.0–0.1)
Basophils Relative: 1 %
Eosinophils Absolute: 0.1 10*3/uL (ref 0.0–0.5)
Eosinophils Relative: 1 %
HCT: 28.6 % — ABNORMAL LOW (ref 36.0–46.0)
Hemoglobin: 9.3 g/dL — ABNORMAL LOW (ref 12.0–15.0)
Immature Granulocytes: 1 %
Lymphocytes Relative: 23 %
Lymphs Abs: 2.2 10*3/uL (ref 0.7–4.0)
MCH: 30.6 pg (ref 26.0–34.0)
MCHC: 32.5 g/dL (ref 30.0–36.0)
MCV: 94.1 fL (ref 80.0–100.0)
Monocytes Absolute: 0.9 10*3/uL (ref 0.1–1.0)
Monocytes Relative: 10 %
Neutro Abs: 6.2 10*3/uL (ref 1.7–7.7)
Neutrophils Relative %: 64 %
Platelets: 334 10*3/uL (ref 150–400)
RBC: 3.04 MIL/uL — ABNORMAL LOW (ref 3.87–5.11)
RDW: 14 % (ref 11.5–15.5)
WBC: 9.5 10*3/uL (ref 4.0–10.5)
nRBC: 0 % (ref 0.0–0.2)

## 2020-08-23 LAB — BASIC METABOLIC PANEL
Anion gap: 7 (ref 5–15)
BUN: 24 mg/dL — ABNORMAL HIGH (ref 8–23)
CO2: 30 mmol/L (ref 22–32)
Calcium: 7.9 mg/dL — ABNORMAL LOW (ref 8.9–10.3)
Chloride: 103 mmol/L (ref 98–111)
Creatinine, Ser: 2.39 mg/dL — ABNORMAL HIGH (ref 0.44–1.00)
GFR, Estimated: 22 mL/min — ABNORMAL LOW (ref >60)
Glucose, Bld: 87 mg/dL (ref 70–99)
Potassium: 3.9 mmol/L (ref 3.5–5.1)
Sodium: 140 mmol/L (ref 135–145)

## 2020-08-23 MED ORDER — PROCHLORPERAZINE EDISYLATE 10 MG/2ML IJ SOLN
10.0000 mg | Freq: Once | INTRAMUSCULAR | Status: DC
  Filled 2020-08-23: qty 2

## 2020-08-23 MED ORDER — LACTATED RINGERS IV BOLUS: 1000.0000 mL | Freq: Once | INTRAVENOUS | Status: DC

## 2020-08-23 MED ORDER — DIPHENHYDRAMINE HCL 50 MG/ML IJ SOLN
25.0000 mg | Freq: Once | INTRAMUSCULAR | Status: DC
  Filled 2020-08-23: qty 1

## 2020-08-23 MED ORDER — BUTALBITAL-APAP-CAFFEINE 50-325-40 MG PO TABS
2.0000 | ORAL_TABLET | Freq: Once | ORAL | Status: DC
  Filled 2020-08-23: qty 2

## 2020-08-23 NOTE — ED Provider Notes (Addendum)
[facility] Emergency Department Provider Note ____________________________________________   Event Date/Time   First MD Initiated Contact with Patient 08/23/20 1606     (approximate)  I have reviewed the triage vital signs and the nursing notes.  HISTORY  Chief Complaint Headache   HPI Katie [name] is a 65 y.o. femalewho presents to the ED for evaluation of headache.  Chart review indicates history of HTN, CKD, DM CHF. History of chronic migraines and headaches.  Recently transitioned entirely off of opioids.  Recent prolonged admission from 12/18-2/19/2022.  During this admission had MRI, MRA and MRV to evaluate her headaches, all of which were unremarkable.  She was continued on venlafaxine and Topamax for prophylaxis. Progressed to ESRD during this admission and was started on TTS hemodialysis.  Patient presents to the ED due to acute on chronic headache.  When the first things that she says to me is that she " wants drugs from a headache."  She indicates that she is requesting a short course of opiates due to severe headache in the setting of this being the anniversary of the death of her father, occurring 4 years ago.  She reports that she "knows that she is going to have a bad couple days" due to this being that anniversary, so she is requesting assistance with this in the form of improved headache control with opiates.  She reports that she is feeling a 9.75/10 global headache that is typical for her headaches, but of a more severe intensity.  She denies any syncope, falls or trauma, emesis, vision changes, gait changes.  She reports going to dialysis this morning and completing her session as scheduled, but having increasing headache after that time.   Past Medical History:  Diagnosis Date  . 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[place])   . Hypertension   . Renal disorder   . Type 2 diabetes mellitus [facility])     Patient Active Problem List    Diagnosis Date Noted  . Persistent headaches 06/27/2020  . ATN (acute tubular necrosis) ([place])   . Pulmonary hypertension, unspecified ([place])   . Chronic kidney disease (CKD), stage IV (severe) ([place])   . Pleural effusion   . Acute heart failure with preserved ejection fraction (HFpEF) ([place])   . Pressure injury of skin 05/01/2020  . Hyperkalemia   . Volume overload 04/30/2020  . Type 2 diabetes mellitus with stage 5 chronic kidney disease ([place]) 04/30/2020  . Hypertension   . 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[place])   . Type 2 diabetes mellitus ([place])     Past Surgical History:  Procedure Laterality Date  . APPENDECTOMY    . DIALYSIS/PERMA CATHETER INSERTION N/A 05/16/2020   Procedure: DIALYSIS/PERMA CATHETER INSERTION;  Surgeon: Algernon Huxley, MD;  Location: [place] [facility];  Service: Cardiovascular;  Laterality: N/A;  . MANDIBLE SURGERY    . RENAL BIOPSY Right   . RIGHT HEART CATH N/A 05/10/2020   Procedure: RIGHT HEART CATH;  Surgeon: Nelva Bush, MD;  Location: [place] [facility];  Service: Cardiovascular;  Laterality: N/A;  . TEMPORARY DIALYSIS CATHETER N/A 05/11/2020   Procedure: TEMPORARY DIALYSIS CATHETER;  Surgeon: Algernon Huxley, MD;  Location: [place] [facility];  Service: Cardiovascular;  Laterality: N/A;    Prior to Admission medications   Medication Sig Start Date End Date Taking? Authorizing Provider  carvedilol (COREG) 25 MG tablet Take 1 tablet (25 mg total) by mouth 2 (  two) times daily with a meal. 07/02/20 08/01/20  Nolberto Hanlon, MD  cetirizine (ZYRTEC) 10 MG tablet Take 10 mg by mouth daily.    [provider]  folic acid (FOLVITE) 1 MG tablet Take 1 mg by mouth daily.    [provider]  topiramate (TOPAMAX) 50 MG tablet Take 1 tablet (50 mg total) by mouth 2 (two) times daily. 07/02/20 08/01/20  Nolberto Hanlon, MD  torsemide (DEMADEX) 10 MG tablet Take 1 tablet (10 mg total) by mouth daily. 07/03/20 08/02/20  Nolberto Hanlon, MD  traZODone  (DESYREL) 50 MG tablet Take 1 tablet (50 mg total) by mouth every evening. 07/02/20 08/01/20  Nolberto Hanlon, MD  venlafaxine (EFFEXOR) 37.5 MG tablet Take 1 tablet (37.5 mg total) by mouth 2 (two) times daily with a meal. 07/02/20 08/01/20  Nolberto Hanlon, MD  verapamil (CALAN-SR) 240 MG CR tablet Take 1 tablet (240 mg total) by mouth at bedtime. 07/02/20 08/01/20  Nolberto Hanlon, MD    Allergies Penicillins  Family History  Problem Relation Age of Onset  . Heart attack Mother   . Asthma Mother   . Uterine cancer Mother   . Skin cancer Father   . Prostate cancer Father   . CAD Father   . Parkinson's disease Father   . Lung cancer Brother     Social History Social History   Tobacco Use  . Smoking status: Former Research scientist (life sciences)  . Smokeless tobacco: Never Used  Vaping Use  . Vaping Use: Never used  Substance Use Topics  . Alcohol use: Not Currently    Review of Systems  Constitutional: No fever/chills Eyes: No visual changes. ENT: No sore throat. Cardiovascular: Denies chest pain. Respiratory: Denies shortness of breath. Gastrointestinal: No abdominal pain.  No nausea, no vomiting.  No diarrhea.  No constipation. Genitourinary: Negative for dysuria. Musculoskeletal: Negative for back pain. Skin: Negative for rash. Neurological: Negative for, focal weakness or numbness.  Positive for headache  ____________________________________________   PHYSICAL EXAM:  VITAL SIGNS: Vitals:   08/23/20 1630 08/23/20 1730  BP: (!) 158/67 (!) 182/80  Pulse: 86 83  Resp: 16 16  Temp:    SpO2: 97% 95%    Constitutional: Alert and oriented. Well appearing and in no acute distress. Eyes: Conjunctivae are normal. PERRL. EOMI. Head: Atraumatic. Nose: No congestion/rhinnorhea. Mouth/Throat: Mucous membranes are moist.  Oropharynx non-erythematous. Neck: No stridor. No cervical spine tenderness to palpation. Cardiovascular: Normal rate, regular rhythm. Grossly normal heart sounds.  Good peripheral  circulation. Respiratory: Normal respiratory effort.  No retractions. Lungs CTAB. Gastrointestinal: Soft , nondistended, nontender to palpation. No CVA tenderness. Musculoskeletal: No lower extremity tenderness nor edema.  No joint effusions. No signs of acute trauma. Tunneled dialysis catheter to the right chest, clean insertion site without erythema, purulence or induration. Neurologic:  Normal speech and language. No gross focal neurologic deficits are appreciated.  Cranial nerves II through XII intact 5/5 strength and sensation in all 4 extremities Skin:  Skin is warm, dry and intact. No rash noted. Psychiatric: Mood and affect are normal. Speech and behavior are normal. ____________________________________________   LABS (all labs ordered are listed, but only abnormal results are displayed)  Labs Reviewed  CBC WITH DIFFERENTIAL/PLATELET - Abnormal; Notable for the following components:      Result Value   RBC 3.04 (*)    Hemoglobin 9.3 (*)    HCT 28.6 (*)    All other components within normal limits  BASIC METABOLIC PANEL - Abnormal; Notable for the  following components:   BUN 24 (*)    Creatinine, Ser 2.39 (*)    Calcium 7.9 (*)    GFR, Estimated 22 (*)    All other components within normal limits    ____________________________________________   PROCEDURES and INTERVENTIONS  Procedure(s) performed (including Critical Care):  Procedures  Medications  butalbital-acetaminophen-caffeine (FIORICET) 50-325-40 MG per tablet 2 tablet (has no administration in time range)  prochlorperazine (COMPAZINE) injection 10 mg (has no administration in time range)  diphenhydrAMINE (BENADRYL) injection 25 mg (has no administration in time range)    ____________________________________________   MDM / ED COURSE   65 year old woman with history of chronic headaches of uncertain etiology presents to the ED with acute on chronic headaches, without evidence of additional acute  pathology.  Mild hypertension, likely chronic for the patient, otherwise normal vitals on room air.  Exam is reassuring without distress, neurologic or vascular deficits.  No evidence of volume overload and she looks well, conversational in full sentences.  No noted apparent discomfort, photophobia.  Blood work demonstrates chronic anemia at baseline and known CKD/ESRD.  She has had no trauma, falls, syncopal episodes or further acute symptoms to necessitate CT imaging of the brain.  We discussed pain control and likely outpatient management.  Patient is agreeable.  We discussed not providing opiates for headaches, and with some education, she is agreeable to this.  Patient ultimately refusing medications and demanding discharge.  I see no barriers to outpatient management.  Discharged with return precautions.  Clinical Course as of 08/23/20 1749  Tue Aug 23, 2020  1746 Nurse informs me that patient is requesting discharge and refusing medications because they are not opiates. [DS]  1748 I could reevaluate the patient and she reports that those medicines do not work for her and that she wants something else.  I educate her that this is an appropriate regimen of medications to help with her headaches, but she reports that she knows that this does not work.  She becomes tearful and indicates that we "just think that she is a junky." [DS]  1749 We discussed return precautions for the ED.  We discussed importance of adherence to dialysis. [DS]    Clinical Course User Index [DS] Vladimir Crofts, MD    ____________________________________________   FINAL CLINICAL IMPRESSION(S) / ED DIAGNOSES  Final diagnoses:  Bad headache     ED Discharge Orders    None       Kennetha Pearman Tamala Julian   Note:  This document was prepared using Dragon voice recognition software and may include unintentional dictation errors.   Vladimir Crofts, MD 08/23/20 1740    Vladimir Crofts, MD 08/23/20 [phone]

## 2020-08-23 NOTE — Discharge Instructions (Signed)
Use Tylenol for pain and fevers.  Up to 1000 mg per dose, up to 4 times per day.  Do not take more than 4000 mg of Tylenol/acetaminophen within 24 hours..  Return to the ED with any fevers and headaches, passing out or uncontrolled headaches.

## 2020-08-23 NOTE — ED Triage Notes (Signed)
Patient arrived via EMS for headache that has lasted "a couple of days constant and progressively worse." Pt states she always has a headache, but this is just worse than usual. Hx of HTN and BP has been high recently regardless of taking medications.

## 2020-11-25 ENCOUNTER — Emergency Department
Admission: EM | Admit: 2020-11-25 | Discharge: 2020-11-25 | Disposition: A | Discharge status: Home / Self Care | Payer: Medicaid Other | Attending: Emergency Medicine | Admitting: Emergency Medicine

## 2020-11-25 ENCOUNTER — Emergency Department: Payer: Medicaid Other

## 2020-11-25 ENCOUNTER — Other Ambulatory Visit: Payer: Self-pay

## 2020-11-25 DIAGNOSIS — R109 Unspecified abdominal pain: Secondary | ICD-10-CM | POA: Diagnosis not present

## 2020-11-25 DIAGNOSIS — M549 Dorsalgia, unspecified: Secondary | ICD-10-CM | POA: Diagnosis not present

## 2020-11-25 DIAGNOSIS — Z87891 Personal history of nicotine dependence: Secondary | ICD-10-CM | POA: Insufficient documentation

## 2020-11-25 DIAGNOSIS — I120 Hypertensive chronic kidney disease with stage 5 chronic kidney disease or end stage renal disease: Secondary | ICD-10-CM | POA: Diagnosis not present

## 2020-11-25 DIAGNOSIS — N186 End stage renal disease: Secondary | ICD-10-CM | POA: Insufficient documentation

## 2020-11-25 DIAGNOSIS — E1122 Type 2 diabetes mellitus with diabetic chronic kidney disease: Secondary | ICD-10-CM | POA: Diagnosis not present

## 2020-11-25 DIAGNOSIS — R0781 Pleurodynia: Secondary | ICD-10-CM

## 2020-11-25 DIAGNOSIS — J90 Pleural effusion, not elsewhere classified: Secondary | ICD-10-CM | POA: Diagnosis not present

## 2020-11-25 LAB — COMPREHENSIVE METABOLIC PANEL
ALT: 17 U/L (ref 0–44)
AST: 19 U/L (ref 15–41)
Albumin: 3.4 g/dL — ABNORMAL LOW (ref 3.5–5.0)
Alkaline Phosphatase: 83 U/L (ref 38–126)
Anion gap: 12 (ref 5–15)
BUN: 47 mg/dL — ABNORMAL HIGH (ref 8–23)
CO2: 25 mmol/L (ref 22–32)
Calcium: 8.5 mg/dL — ABNORMAL LOW (ref 8.9–10.3)
Chloride: 104 mmol/L (ref 98–111)
Creatinine, Ser: 4.41 mg/dL — ABNORMAL HIGH (ref 0.44–1.00)
GFR, Estimated: 11 mL/min — ABNORMAL LOW (ref >60)
Glucose, Bld: 168 mg/dL — ABNORMAL HIGH (ref 70–99)
Potassium: 4.7 mmol/L (ref 3.5–5.1)
Sodium: 141 mmol/L (ref 135–145)
Total Bilirubin: 0.5 mg/dL (ref 0.3–1.2)
Total Protein: 6.7 g/dL (ref 6.5–8.1)

## 2020-11-25 LAB — CBC
HCT: 33.3 % — ABNORMAL LOW (ref 36.0–46.0)
Hemoglobin: 10.4 g/dL — ABNORMAL LOW (ref 12.0–15.0)
MCH: 31.7 pg (ref 26.0–34.0)
MCHC: 31.2 g/dL (ref 30.0–36.0)
MCV: 101.5 fL — ABNORMAL HIGH (ref 80.0–100.0)
Platelets: 331 10*3/uL (ref 150–400)
RBC: 3.28 MIL/uL — ABNORMAL LOW (ref 3.87–5.11)
RDW: 13.2 % (ref 11.5–15.5)
WBC: 7.8 10*3/uL (ref 4.0–10.5)
nRBC: 0 % (ref 0.0–0.2)

## 2020-11-25 LAB — LACTIC ACID, PLASMA
Lactic Acid, Venous: 1.2 mmol/L (ref 0.5–1.9)
Lactic Acid, Venous: 1.3 mmol/L (ref 0.5–1.9)

## 2020-11-25 LAB — TROPONIN I (HIGH SENSITIVITY)
Troponin I (High Sensitivity): 16 ng/L (ref <18)
Troponin I (High Sensitivity): 16 ng/L (ref <18)

## 2020-11-25 MED ORDER — CYCLOBENZAPRINE HCL 10 MG PO TABS: 10.0000 mg | ORAL_TABLET | Freq: Three times a day (TID) | ORAL | 0 refills | Status: AC | PRN

## 2020-11-25 MED ORDER — MORPHINE SULFATE (PF) 4 MG/ML IV SOLN
4.0000 mg | Freq: Once | INTRAVENOUS | Status: AC
  Administered 2020-11-25: 4 mg via INTRAVENOUS
  Filled 2020-11-25: qty 1

## 2020-11-25 MED ORDER — ONDANSETRON HCL 4 MG/2ML IJ SOLN
4.0000 mg | Freq: Once | INTRAMUSCULAR | Status: AC
  Administered 2020-11-25: 4 mg via INTRAVENOUS
  Filled 2020-11-25: qty 2

## 2020-11-25 MED ORDER — CYCLOBENZAPRINE HCL 10 MG PO TABS
5.0000 mg | ORAL_TABLET | Freq: Once | ORAL | Status: AC
  Administered 2020-11-25: 5 mg via ORAL
  Filled 2020-11-25: qty 1

## 2020-11-25 NOTE — ED Triage Notes (Signed)
Pt is a dialysis pt. Pt states she has "kidney pain", "blood pressure sky high", chest pain when she takes a deep breath and shortness of breath. Pt is able to speak in full complete sentences, but tachypnea noted on exertion. Pt states her blood pressure at home "was over 200". Skin pwd.

## 2020-11-25 NOTE — ED Notes (Signed)
This RN in room to answer pts call bell and pt was at end of bed. Pt had taken off all monitoring equipment. Pt stating desire to leave. MD made aware and in room to speak with pt.

## 2020-11-25 NOTE — ED Notes (Signed)
Patient transported to CT 

## 2020-11-25 NOTE — ED Provider Notes (Signed)
Emergency Medicine Provider Triage Evaluation Note  Katie [name] , a 65 y.o. female  was evaluated in triage.  Pt complains of multiple complaints but at this time primarily right flank pain.  Review of Systems  Positive: Flank pain, elevated blood pressure, chest pain Negative: Vomiting, fever  Physical Exam  BP 136/77   Pulse 87   Temp 97.9 F (36.6 C) (Oral)   Resp (!) 23 Comment: Pt talking throughout assessments without difficulty  Ht '5\' 6"'$  (1.676 m)   Wt 77.1 kg   SpO2 100%   BMI 27.44 kg/m  Gen:   Awake,, somewhat uncomfortable appearing. Resp:  Normal effort  MSK:   Moves extremities without difficulty. Other:  Ambulating without difficulty.  Medical Decision Making  Medically screening exam initiated at 10:13 PM.  Appropriate orders placed.  Katie [name] was informed that the remainder of the evaluation will be completed by another provider, this initial triage assessment does not replace that evaluation, and the importance of remaining in the ED until their evaluation is complete.  Lab work-up obtained from triage is overall reassuring.  I have ordered IV pain medication and will obtain a CT to evaluate for ureteral stone or other etiology of the flank pain.   Arta Silence, MD 11/25/20 2214

## 2020-11-25 NOTE — ED Notes (Signed)
Pt. Refuses to wear cont. Cardiac monitoring.

## 2020-11-26 NOTE — ED Provider Notes (Signed)
[facility] Emergency Department Provider Note   ____________________________________________   Event Date/Time   First MD Initiated Contact with Patient 11/25/20 2300     (approximate)  I have reviewed the triage vital signs and the nursing notes.   HISTORY  Chief Complaint Hypertension, Back Pain, Chest Pain, and Shortness of Breath    HPI Katie [name] is a 65 y.o. female who presents for multiple complaints including pleuritic chest pain, flank pain, shortness of breath, and hypertension  LOCATION: Chest, flank DURATION: Years  TIMING: Worsening over the last 2 days SEVERITY: 8/10 QUALITY: Aching CONTEXT: States that she has been having pleuritic chest pain as well as flank pain that is intermittent MODIFYING FACTORS: Chest pain is worsened when taking a deep breath and flank pain has no exacerbating or relieving factors ASSOCIATED SYMPTOMS: Shortness of breath, hypertension   Per medical record review patient has history of end-stage renal disease on dialysis, heart failure, persistent pleural effusions, and uncontrolled hypertension          Past Medical History:  Diagnosis Date   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[place])    Hypertension    Renal disorder    Type 2 diabetes mellitus ([place])     Patient Active Problem List   Diagnosis Date Noted   Persistent headaches 06/27/2020   ATN (acute tubular necrosis) ([place])    Pulmonary hypertension, unspecified ([place])    Chronic kidney disease (CKD), stage IV (severe) (HCC)    Pleural effusion    Acute heart failure with preserved ejection fraction (HFpEF) ([place])    Pressure injury of skin 05/01/2020   Hyperkalemia    Volume overload 04/30/2020   Type 2 diabetes mellitus with stage 5 chronic kidney disease ([place]) 04/30/2020   Hypertension    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[place])    Type 2 diabetes mellitus ([place])     Past Surgical History:  Procedure Laterality Date    APPENDECTOMY     DIALYSIS/PERMA CATHETER INSERTION N/A 05/16/2020   Procedure: DIALYSIS/PERMA CATHETER INSERTION;  Surgeon: Algernon Huxley, MD;  Location: [facility];  Service: Cardiovascular;  Laterality: N/A;   MANDIBLE SURGERY     RENAL BIOPSY Right    RIGHT HEART CATH N/A 05/10/2020   Procedure: RIGHT HEART CATH;  Surgeon: Nelva Bush, MD;  Location: [facility];  Service: Cardiovascular;  Laterality: N/A;   TEMPORARY DIALYSIS CATHETER N/A 05/11/2020   Procedure: TEMPORARY DIALYSIS CATHETER;  Surgeon: Algernon Huxley, MD;  Location: [facility];  Service: Cardiovascular;  Laterality: N/A;    Prior to Admission medications   Medication Sig Start Date End Date Taking? Authorizing Provider  cyclobenzaprine (FLEXERIL) 10 MG tablet Take 1 tablet (10 mg total) by mouth 3 (three) times daily as needed for up to 4 days for muscle spasms (left chest pain). 11/25/20 11/29/20 Yes Naaman Plummer, MD  carvedilol (COREG) 25 MG tablet Take 1 tablet (25 mg total) by mouth 2 (two) times daily with a meal. 07/02/20 08/01/20  Nolberto Hanlon, MD  cetirizine (ZYRTEC) 10 MG tablet Take 10 mg by mouth daily.    [provider]  folic acid (FOLVITE) 1 MG tablet Take 1 mg by mouth daily.    [provider]  topiramate (TOPAMAX) 50 MG tablet Take 1 tablet (50 mg total) by mouth 2 (two) times daily. 07/02/20 08/01/20  Nolberto Hanlon, MD  torsemide (DEMADEX) 10 MG tablet  Take 1 tablet (10 mg total) by mouth daily. 07/03/20 08/02/20  Nolberto Hanlon, MD  traZODone (DESYREL) 50 MG tablet Take 1 tablet (50 mg total) by mouth every evening. 07/02/20 08/01/20  Nolberto Hanlon, MD  venlafaxine (EFFEXOR) 37.5 MG tablet Take 1 tablet (37.5 mg total) by mouth 2 (two) times daily with a meal. 07/02/20 08/01/20  Nolberto Hanlon, MD  verapamil (CALAN-SR) 240 MG CR tablet Take 1 tablet (240 mg total) by mouth at bedtime. 07/02/20 08/01/20  Nolberto Hanlon, MD    Allergies Penicillins  Family History   Problem Relation Age of Onset   Heart attack Mother    Asthma Mother    Uterine cancer Mother    Skin cancer Father    Prostate cancer Father    CAD Father    Parkinson's disease Father    Lung cancer Brother     Social History Social History   Tobacco Use   Smoking status: Former   Smokeless tobacco: Never  Scientific laboratory technician Use: Never used  Substance Use Topics   Alcohol use: Not Currently    Review of Systems Constitutional: No fever/chills Eyes: No visual changes. ENT: No sore throat. Cardiovascular: Endorses chest pain. Respiratory: Denies shortness of breath. Gastrointestinal: Endorses bilateral flank pain.  No nausea, no vomiting.  No diarrhea. Genitourinary: Negative for dysuria. Musculoskeletal: Negative for acute arthralgias Skin: Negative for rash. Neurological: Negative for headaches, weakness/numbness/paresthesias in any extremity Psychiatric: Negative for suicidal ideation/homicidal ideation   ____________________________________________   PHYSICAL EXAM:  VITAL SIGNS: ED Triage Vitals [11/25/20 1913]  Enc Vitals Group     BP (!) 110/52     Pulse Rate 97     Resp (!) 24     Temp 97.9 F (36.6 C)     Temp Source Oral     SpO2 99 %     Weight 170 lb (77.1 kg)     Height '5\' 6"'$  (1.676 m)     Head Circumference      Peak Flow      Pain Score 8     Pain Loc      Pain Edu?      Excl. in [place]?    Constitutional: Alert and oriented. Well appearing elderly overweight Caucasian female in no acute distress. Eyes: Conjunctivae are normal. PERRL. Head: Atraumatic. Nose: No congestion/rhinnorhea. Mouth/Throat: Mucous membranes are moist. Neck: No stridor Cardiovascular: Grossly normal heart sounds.  Good peripheral circulation. Respiratory: Normal respiratory effort.  No retractions. Gastrointestinal: Soft and nontender. No distention. Musculoskeletal: No obvious deformities Neurologic:  Normal speech and language. No gross focal neurologic  deficits are appreciated. Skin:  Skin is warm and dry. No rash noted. Psychiatric: Mood and affect are normal. Speech and behavior are normal.  ____________________________________________   LABS (all labs ordered are listed, but only abnormal results are displayed)  Labs Reviewed  CBC - Abnormal; Notable for the following components:      Result Value   RBC 3.28 (*)    Hemoglobin 10.4 (*)    HCT 33.3 (*)    MCV 101.5 (*)    All other components within normal limits  COMPREHENSIVE METABOLIC PANEL - Abnormal; Notable for the following components:   Glucose, Bld 168 (*)    BUN 47 (*)    Creatinine, Ser 4.41 (*)    Calcium 8.5 (*)    Albumin 3.4 (*)    GFR, Estimated 11 (*)    All other components within normal limits  LACTIC  ACID, PLASMA  LACTIC ACID, PLASMA  TROPONIN I (HIGH SENSITIVITY)  TROPONIN I (HIGH SENSITIVITY)   ____________________________________________  EKG  ED ECG REPORT I, Naaman Plummer, the attending physician, personally viewed and interpreted this ECG.  Date: 11/26/2020 EKG Time: 1929 Rate: 94 Rhythm: normal sinus rhythm QRS Axis: normal Intervals: normal ST/T Wave abnormalities: normal Narrative Interpretation: no evidence of acute ischemia  ____________________________________________  RADIOLOGY  ED MD interpretation: Chest x-ray shows persistent but decreased bilateral pleural effusions with bibasilar atelectasis  CT renal stone study shows no acute intra-abdominal pathology  Official radiology report(s): DG Chest 2 View  Result Date: 11/25/2020 CLINICAL DATA:  Chest pain, short of breath, hypertension EXAM: CHEST - 2 VIEW COMPARISON:  06/29/2020 FINDINGS: Frontal and lateral views of the chest demonstrate stable right internal jugular dialysis catheter. The cardiac silhouette is stable. There are small bilateral pleural effusions, right greater than left, decreased since prior study. Patchy consolidation at the lung bases likely reflects  atelectasis or scarring. No acute airspace disease or pneumothorax. No acute bony abnormalities. IMPRESSION: 1. Persistent but decreased bilateral pleural effusions, with associated bibasilar scarring or atelectasis. 2. No acute airspace disease. Electronically Signed   By: Randa Ngo M.D.   On: 11/25/2020 20:02   CT Renal Stone Study  Result Date: 11/25/2020 CLINICAL DATA:  Right flank pain EXAM: CT ABDOMEN AND PELVIS WITHOUT CONTRAST TECHNIQUE: Multidetector CT imaging of the abdomen and pelvis was performed following the standard protocol without IV contrast. COMPARISON:  None. FINDINGS: Lower chest: Small bilateral pleural effusions are present. There is compressive atelectasis of the visualized left lower lobe. Global cardiac size is within normal limits. Trace pericardial effusion. Hepatobiliary: Cholelithiasis without pericholecystic inflammatory change. Liver unremarkable. No intra or extrahepatic biliary ductal dilation. Pancreas: Unremarkable Spleen: Unremarkable Adrenals/Urinary Tract: The adrenal glands are unremarkable. The kidneys are normal in size and position. Probable vascular calcifications within the renal hila bilaterally. No definite calculi within the renal collecting system bilaterally. No hydronephrosis. No perinephric fluid collections. The bladder is unremarkable. Stomach/Bowel: There is severe sigmoid colonic diverticulosis. No superimposed acute inflammatory changes are identified, however. The stomach, small bowel, and large bowel are otherwise unremarkable. Appendix absent. No free intraperitoneal gas or fluid. Vascular/Lymphatic: Moderate aortoiliac atherosclerotic calcification. No aortic aneurysm. No pathologic adenopathy within the abdomen and pelvis. Reproductive: Uterus and bilateral adnexa are unremarkable. Other: Mild diffuse subcutaneous body wall edema. No abdominal wall hernia. The rectum is unremarkable. Sacral decubitus ulcer noted extending to the coccyx. No  discrete drainable fluid collection identified. Musculoskeletal: No acute bone abnormality. No lytic or blastic bone lesion. Intra osseous hemangioma noted within the L2 vertebral body. IMPRESSION: No acute intra-abdominal pathology identified. No definite radiographic explanation for the patient's reported symptoms. Cholelithiasis. Severe sigmoid colonic diverticulosis. Mild anasarca with small bilateral pleural effusions and diffuse body wall subcutaneous edema. Sacral decubitus ulcer with extension to the a coccyx. No discrete drainable fluid collection. Aortic Atherosclerosis (ICD10-I70.0). Electronically Signed   By: Fidela Salisbury MD   On: 11/25/2020 22:43    ____________________________________________   PROCEDURES  Procedure(s) performed (including Critical Care):  .1-3 Lead EKG Interpretation  Date/Time: 11/26/2020 12:33 AM Performed by: Naaman Plummer, MD Authorized by: Naaman Plummer, MD     Interpretation: normal     ECG rate:  84   ECG rate assessment: normal     Rhythm: sinus rhythm     Ectopy: none     Conduction: normal     ____________________________________________   INITIAL  IMPRESSION / ASSESSMENT AND PLAN / ED COURSE  As part of my medical decision making, I reviewed the following data within the electronic medical record, if available:  Nursing notes reviewed and incorporated, Labs reviewed, EKG interpreted, Old chart reviewed, Radiograph reviewed and Notes from prior ED visits reviewed and incorporated     Presents to the emergency department complaining of high blood pressure. Patient is otherwise asymptomatic without confusion, chest pain, hematuria, or SOB. +Nonadherence to antihypertensive regimen DDx: CV, AMI, heart failure, renal infarction or failure or other end organ damage. Based on history, physical exam, laboratory evaluation, patient shows no new acute abnormalities. Disposition: Discussed with patient their elevated blood pressure and need for  close outpatient management of their hypertension. Will provide a prescription for the patients previous antihypertensive medication and arrange for the patient to follow up in a primary care clinic       ____________________________________________   FINAL CLINICAL IMPRESSION(S) / ED DIAGNOSES  Final diagnoses:  Pleuritic pain  Chronic bilateral pleural effusions  ESRD (end stage renal disease) on dialysis ([place])  Flank pain     ED Discharge Orders          Ordered    cyclobenzaprine (FLEXERIL) 10 MG tablet  3 times daily PRN        11/25/20 2335             Note:  This document was prepared using Dragon voice recognition software and may include unintentional dictation errors.    Naaman Plummer, MD 11/26/20 [phone]

## 2020-12-07 ENCOUNTER — Emergency Department: Payer: Medicaid Other

## 2020-12-07 ENCOUNTER — Emergency Department
Admission: EM | Admit: 2020-12-07 | Discharge: 2020-12-07 | Disposition: A | Discharge status: Home / Self Care | Payer: Medicaid Other | Attending: Emergency Medicine | Admitting: Emergency Medicine

## 2020-12-07 ENCOUNTER — Other Ambulatory Visit: Payer: Self-pay

## 2020-12-07 DIAGNOSIS — Z20822 Contact with and (suspected) exposure to covid-19: Secondary | ICD-10-CM | POA: Insufficient documentation

## 2020-12-07 DIAGNOSIS — Z87891 Personal history of nicotine dependence: Secondary | ICD-10-CM | POA: Diagnosis not present

## 2020-12-07 DIAGNOSIS — N186 End stage renal disease: Secondary | ICD-10-CM | POA: Diagnosis not present

## 2020-12-07 DIAGNOSIS — M62838 Other muscle spasm: Secondary | ICD-10-CM | POA: Diagnosis not present

## 2020-12-07 DIAGNOSIS — R109 Unspecified abdominal pain: Secondary | ICD-10-CM | POA: Insufficient documentation

## 2020-12-07 DIAGNOSIS — I132 Hypertensive heart and chronic kidney disease with heart failure and with stage 5 chronic kidney disease, or end stage renal disease: Secondary | ICD-10-CM | POA: Insufficient documentation

## 2020-12-07 DIAGNOSIS — Z79899 Other long term (current) drug therapy: Secondary | ICD-10-CM | POA: Diagnosis not present

## 2020-12-07 DIAGNOSIS — J918 Pleural effusion in other conditions classified elsewhere: Secondary | ICD-10-CM | POA: Insufficient documentation

## 2020-12-07 DIAGNOSIS — Z955 Presence of coronary angioplasty implant and graft: Secondary | ICD-10-CM | POA: Insufficient documentation

## 2020-12-07 DIAGNOSIS — Z992 Dependence on renal dialysis: Secondary | ICD-10-CM | POA: Insufficient documentation

## 2020-12-07 DIAGNOSIS — I5030 Unspecified diastolic (congestive) heart failure: Secondary | ICD-10-CM | POA: Diagnosis not present

## 2020-12-07 DIAGNOSIS — R519 Headache, unspecified: Secondary | ICD-10-CM | POA: Insufficient documentation

## 2020-12-07 DIAGNOSIS — E1122 Type 2 diabetes mellitus with diabetic chronic kidney disease: Secondary | ICD-10-CM | POA: Diagnosis not present

## 2020-12-07 LAB — CBC WITH DIFFERENTIAL/PLATELET
Abs Immature Granulocytes: 0.01 10*3/uL (ref 0.00–0.07)
Basophils Absolute: 0.1 10*3/uL (ref 0.0–0.1)
Basophils Relative: 1 %
Eosinophils Absolute: 0.1 10*3/uL (ref 0.0–0.5)
Eosinophils Relative: 2 %
HCT: 34.1 % — ABNORMAL LOW (ref 36.0–46.0)
Hemoglobin: 10.9 g/dL — ABNORMAL LOW (ref 12.0–15.0)
Immature Granulocytes: 0 %
Lymphocytes Relative: 22 %
Lymphs Abs: 1.4 10*3/uL (ref 0.7–4.0)
MCH: 31.8 pg (ref 26.0–34.0)
MCHC: 32 g/dL (ref 30.0–36.0)
MCV: 99.4 fL (ref 80.0–100.0)
Monocytes Absolute: 0.8 10*3/uL (ref 0.1–1.0)
Monocytes Relative: 13 %
Neutro Abs: 3.9 10*3/uL (ref 1.7–7.7)
Neutrophils Relative %: 62 %
Platelets: 331 10*3/uL (ref 150–400)
RBC: 3.43 MIL/uL — ABNORMAL LOW (ref 3.87–5.11)
RDW: 13.3 % (ref 11.5–15.5)
WBC: 6.4 10*3/uL (ref 4.0–10.5)
nRBC: 0 % (ref 0.0–0.2)

## 2020-12-07 LAB — COMPREHENSIVE METABOLIC PANEL
ALT: 15 U/L (ref 0–44)
AST: 15 U/L (ref 15–41)
Albumin: 3.3 g/dL — ABNORMAL LOW (ref 3.5–5.0)
Alkaline Phosphatase: 82 U/L (ref 38–126)
Anion gap: 12 (ref 5–15)
BUN: 45 mg/dL — ABNORMAL HIGH (ref 8–23)
CO2: 27 mmol/L (ref 22–32)
Calcium: 8.7 mg/dL — ABNORMAL LOW (ref 8.9–10.3)
Chloride: 100 mmol/L (ref 98–111)
Creatinine, Ser: 4.7 mg/dL — ABNORMAL HIGH (ref 0.44–1.00)
GFR, Estimated: 10 mL/min — ABNORMAL LOW (ref >60)
Glucose, Bld: 106 mg/dL — ABNORMAL HIGH (ref 70–99)
Potassium: 4.6 mmol/L (ref 3.5–5.1)
Sodium: 139 mmol/L (ref 135–145)
Total Bilirubin: 0.7 mg/dL (ref 0.3–1.2)
Total Protein: 6.9 g/dL (ref 6.5–8.1)

## 2020-12-07 LAB — LIPASE, BLOOD: Lipase: 47 U/L (ref 11–51)

## 2020-12-07 LAB — RESP PANEL BY RT-PCR (FLU A&B, COVID) ARPGX2
Influenza A by PCR: NEGATIVE
Influenza B by PCR: NEGATIVE
SARS Coronavirus 2 by RT PCR: NEGATIVE

## 2020-12-07 LAB — TROPONIN I (HIGH SENSITIVITY): Troponin I (High Sensitivity): 14 ng/L (ref <18)

## 2020-12-07 MED ORDER — MORPHINE SULFATE (PF) 4 MG/ML IV SOLN
4.0000 mg | Freq: Once | INTRAVENOUS | Status: AC
  Administered 2020-12-07: 4 mg via INTRAVENOUS
  Filled 2020-12-07: qty 1

## 2020-12-07 MED ORDER — LIDOCAINE 5 % EX PTCH: 1.0000 | MEDICATED_PATCH | Freq: Two times a day (BID) | CUTANEOUS | 0 refills | Status: DC

## 2020-12-07 MED ORDER — CYCLOBENZAPRINE HCL 5 MG PO TABS: 5.0000 mg | ORAL_TABLET | Freq: Three times a day (TID) | ORAL | 0 refills | Status: DC | PRN

## 2020-12-07 MED ORDER — LIDOCAINE 5 % EX PTCH
1.0000 | MEDICATED_PATCH | CUTANEOUS | Status: DC
  Administered 2020-12-07: 1 via TRANSDERMAL
  Filled 2020-12-07: qty 1

## 2020-12-07 NOTE — ED Provider Notes (Signed)
[facility] Emergency Department Provider Note   ____________________________________________   Event Date/Time   First MD Initiated Contact with Patient 12/07/20 1332     (approximate)  I have reviewed the triage vital signs and the nursing notes.   HISTORY  Chief Complaint Generalized Body Aches and back spasms    HPI Katie [name] is a 65 y.o. female with past medical story of hypertension, diabetes, ESRD on HD (TTS), and diastolic CHF who presents to the ED complaining of flank pain.  Patient reports that she has had constant sharp pain in her right flank since last night.  Pain seems to be alleviated if she is up moving around, patient currently states that she is unable to sit still due to this pain.  She still makes urine and denies any dysuria or hematuria.  She has not had any fevers, cough, or chest pain, does state that she will occasionally feel short of breath due to the pain.  She has not had any vomiting or diarrhea, denies any sick contacts.  She had similar pain about 10 days ago, was seen in the ED at that time with unremarkable work-up.  She states the pain had improved but came back last night.  She does report a history of kidney stones.        Past Medical History:  Diagnosis Date   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[place])    Hypertension    Renal disorder    Type 2 diabetes mellitus ([place])     Patient Active Problem List   Diagnosis Date Noted   Persistent headaches 06/27/2020   ATN (acute tubular necrosis) ([place])    Pulmonary hypertension, unspecified (HCC)    Chronic kidney disease (CKD), stage IV (severe) (HCC)    Pleural effusion    Acute heart failure with preserved ejection fraction (HFpEF) ([place])    Pressure injury of skin 05/01/2020   Hyperkalemia    Volume overload 04/30/2020   Type 2 diabetes mellitus with stage 5 chronic kidney disease ([place]) 04/30/2020   Hypertension    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 2 diabetes  mellitus (HCC)    Type 2 diabetes mellitus ([place])     Past Surgical History:  Procedure Laterality Date   APPENDECTOMY     DIALYSIS/PERMA CATHETER INSERTION N/A 05/16/2020   Procedure: DIALYSIS/PERMA CATHETER INSERTION;  Surgeon: Algernon Huxley, MD;  Location: [facility];  Service: Cardiovascular;  Laterality: N/A;   MANDIBLE SURGERY     RENAL BIOPSY Right    RIGHT HEART CATH N/A 05/10/2020   Procedure: RIGHT HEART CATH;  Surgeon: Nelva Bush, MD;  Location: [facility];  Service: Cardiovascular;  Laterality: N/A;   TEMPORARY DIALYSIS CATHETER N/A 05/11/2020   Procedure: TEMPORARY DIALYSIS CATHETER;  Surgeon: Algernon Huxley, MD;  Location: [facility];  Service: Cardiovascular;  Laterality: N/A;    Prior to Admission medications   Medication Sig Start Date End Date Taking? Authorizing Provider  cyclobenzaprine (FLEXERIL) 5 MG tablet Take 1 tablet (5 mg total) by mouth 3 (three) times daily as needed. 12/07/20  Yes Blake Divine, MD  lidocaine (LIDODERM) 5 % Place 1 patch onto the skin every 12 (twelve) hours. Remove & Discard patch within 12 hours or as directed by MD 12/07/20 12/07/21 Yes Blake Divine, MD  carvedilol (COREG) 25 MG tablet Take 1 tablet (25 mg total) by mouth 2 (two) times daily with a meal. 07/02/20 08/01/20  Nolberto Hanlon, MD  cetirizine (ZYRTEC) 10 MG tablet Take 10 mg by mouth daily.    [provider]  folic acid (FOLVITE) 1 MG tablet Take 1 mg by mouth daily.    [provider]  topiramate (TOPAMAX) 50 MG tablet Take 1 tablet (50 mg total) by mouth 2 (two) times daily. 07/02/20 08/01/20  Nolberto Hanlon, MD  torsemide (DEMADEX) 10 MG tablet Take 1 tablet (10 mg total) by mouth daily. 07/03/20 08/02/20  Nolberto Hanlon, MD  traZODone (DESYREL) 50 MG tablet Take 1 tablet (50 mg total) by mouth every evening. 07/02/20 08/01/20  Nolberto Hanlon, MD  venlafaxine (EFFEXOR) 37.5 MG tablet Take 1 tablet (37.5 mg total) by mouth 2 (two) times daily  with a meal. 07/02/20 08/01/20  Nolberto Hanlon, MD  verapamil (CALAN-SR) 240 MG CR tablet Take 1 tablet (240 mg total) by mouth at bedtime. 07/02/20 08/01/20  Nolberto Hanlon, MD    Allergies Penicillins  Family History  Problem Relation Age of Onset   Heart attack Mother    Asthma Mother    Uterine cancer Mother    Skin cancer Father    Prostate cancer Father    CAD Father    Parkinson's disease Father    Lung cancer Brother     Social History Social History   Tobacco Use   Smoking status: Former   Smokeless tobacco: Never  Scientific laboratory technician Use: Never used  Substance Use Topics   Alcohol use: Not Currently    Review of Systems  Constitutional: No fever/chills Eyes: No visual changes. ENT: No sore throat. Cardiovascular: Denies chest pain. Respiratory: Positive for shortness of breath. Gastrointestinal: No abdominal pain.  Positive for flank pain.  No nausea, no vomiting.  No diarrhea.  No constipation. Genitourinary: Negative for dysuria. Musculoskeletal: Negative for back pain. Skin: Negative for rash. Neurological: Negative for headaches, focal weakness or numbness.  ____________________________________________   PHYSICAL EXAM:  VITAL SIGNS: ED Triage Vitals [12/07/20 1223]  Enc Vitals Group     BP (!) 141/107     Pulse Rate (!) 105     Resp 18     Temp 99.9 F (37.7 C)     Temp Source Oral     SpO2 99 %     Weight 169 lb 12.1 oz (77 kg)     Height '5\' 6"'$  (1.676 m)     Head Circumference      Peak Flow      Pain Score 10     Pain Loc      Pain Edu?      Excl. in [place]?     Constitutional: Alert and oriented. Eyes: Conjunctivae are normal. Head: Atraumatic. Nose: No congestion/rhinnorhea. Mouth/Throat: Mucous membranes are moist. Neck: Normal ROM Cardiovascular: Normal rate, regular rhythm. Grossly normal heart sounds. Respiratory: Normal respiratory effort.  No retractions. Lungs CTAB. Gastrointestinal: Soft and tender to palpation in the right  upper and right lower quadrants with no rebound or guarding.  Right CVA tenderness noted, none noted on the left.  No distention. Genitourinary: deferred Musculoskeletal: No lower extremity tenderness nor edema. Neurologic:  Normal speech and language. No gross focal neurologic deficits are appreciated. Skin:  Skin is warm, dry and intact. No rash noted. Psychiatric: Mood and affect are normal. Speech and behavior are normal.  ____________________________________________   LABS (all labs ordered are listed, but only abnormal results are displayed)  Labs Reviewed  CBC WITH DIFFERENTIAL/PLATELET - Abnormal; Notable for the following  components:      Result Value   RBC 3.43 (*)    Hemoglobin 10.9 (*)    HCT 34.1 (*)    All other components within normal limits  COMPREHENSIVE METABOLIC PANEL - Abnormal; Notable for the following components:   Glucose, Bld 106 (*)    BUN 45 (*)    Creatinine, Ser 4.70 (*)    Calcium 8.7 (*)    Albumin 3.3 (*)    GFR, Estimated 10 (*)    All other components within normal limits  RESP PANEL BY RT-PCR (FLU A&B, COVID) ARPGX2  LIPASE, BLOOD  URINALYSIS, COMPLETE (UACMP) WITH MICROSCOPIC  TROPONIN I (HIGH SENSITIVITY)  TROPONIN I (HIGH SENSITIVITY)   ____________________________________________  EKG  ED ECG REPORT I, Blake Divine, the attending physician, personally viewed and interpreted this ECG.   Date: 12/07/2020  EKG Time: 14:23  Rate: 91  Rhythm: normal sinus rhythm  Axis: Normal  Intervals:none  ST&T Change: None   PROCEDURES  Procedure(s) performed (including Critical Care):  Procedures   ____________________________________________   INITIAL IMPRESSION / ASSESSMENT AND PLAN / ED COURSE      65 year old female with past medical history of hypertension, diabetes, ESRD on HD (TTS) who presents to the ED complaining of constant right flank pain since last night.  Pain is reproduced upon palpation of right CVA as well as  palpation of right side of her abdomen.  CT scan performed during prior ED visit was unremarkable, we will repeat today given recurrent pain.  EKG shows no evidence of arrhythmia or ischemia, we will screen troponin but overall suspicion for PE or ACS is low.  Labs and UA are pending, patient states that she still makes urine but denies any urinary symptoms.  We will treat symptomatically with IV morphine and Lidoderm patch.  CT scan is unremarkable, no evidence of kidney stone or other obstructive uropathy.  Patient does have small pleural effusion similar to previous along with small pericardial effusion, likely secondary to her ESRD.  Labs are unremarkable, on reassessment patient is feeling much better, states spasms in her flank have now resolved.  She is neurovascularly intact to her lower extremities and there is no evidence of cauda equina.  She is appropriate for discharge home with PCP follow-up, we will prescribe Flexeril and Lidoderm patch.  She was counseled to return to the ED for new worsening symptoms, patient agrees with plan.      ____________________________________________   FINAL CLINICAL IMPRESSION(S) / ED DIAGNOSES  Final diagnoses:  Flank pain  Muscle spasm     ED Discharge Orders          Ordered    cyclobenzaprine (FLEXERIL) 5 MG tablet  3 times daily PRN        12/07/20 1622    lidocaine (LIDODERM) 5 %  Every 12 hours        12/07/20 1622             Note:  This document was prepared using Dragon voice recognition software and may include unintentional dictation errors.    Blake Divine, MD 12/07/20 [phone]

## 2020-12-07 NOTE — ED Notes (Signed)
Patient transported to X-ray 

## 2020-12-07 NOTE — ED Triage Notes (Signed)
Pt comes pov with back spasms and generalized body aches. Pt states stage 4 kidney failure. Was seen here last week wnad given flexeril and pain shot.

## 2020-12-12 ENCOUNTER — Emergency Department: Payer: Medicaid Other

## 2020-12-12 ENCOUNTER — Emergency Department
Admission: EM | Admit: 2020-12-12 | Discharge: 2020-12-12 | Disposition: A | Discharge status: Home / Self Care | Payer: Medicaid Other | Attending: Emergency Medicine | Admitting: Emergency Medicine

## 2020-12-12 DIAGNOSIS — J189 Pneumonia, unspecified organism: Secondary | ICD-10-CM

## 2020-12-12 DIAGNOSIS — Z20822 Contact with and (suspected) exposure to covid-19: Secondary | ICD-10-CM | POA: Insufficient documentation

## 2020-12-12 DIAGNOSIS — R0602 Shortness of breath: Secondary | ICD-10-CM | POA: Diagnosis not present

## 2020-12-12 DIAGNOSIS — M62830 Muscle spasm of back: Secondary | ICD-10-CM | POA: Diagnosis present

## 2020-12-12 DIAGNOSIS — Z992 Dependence on renal dialysis: Secondary | ICD-10-CM | POA: Diagnosis not present

## 2020-12-12 DIAGNOSIS — Z79899 Other long term (current) drug therapy: Secondary | ICD-10-CM | POA: Insufficient documentation

## 2020-12-12 DIAGNOSIS — N186 End stage renal disease: Secondary | ICD-10-CM | POA: Diagnosis not present

## 2020-12-12 DIAGNOSIS — Z87891 Personal history of nicotine dependence: Secondary | ICD-10-CM | POA: Insufficient documentation

## 2020-12-12 DIAGNOSIS — R109 Unspecified abdominal pain: Secondary | ICD-10-CM | POA: Insufficient documentation

## 2020-12-12 DIAGNOSIS — E1122 Type 2 diabetes mellitus with diabetic chronic kidney disease: Secondary | ICD-10-CM | POA: Insufficient documentation

## 2020-12-12 DIAGNOSIS — I132 Hypertensive heart and chronic kidney disease with heart failure and with stage 5 chronic kidney disease, or end stage renal disease: Secondary | ICD-10-CM | POA: Insufficient documentation

## 2020-12-12 DIAGNOSIS — I5030 Unspecified diastolic (congestive) heart failure: Secondary | ICD-10-CM | POA: Insufficient documentation

## 2020-12-12 LAB — RESP PANEL BY RT-PCR (FLU A&B, COVID) ARPGX2
Influenza A by PCR: NEGATIVE
Influenza B by PCR: NEGATIVE
SARS Coronavirus 2 by RT PCR: NEGATIVE

## 2020-12-12 LAB — BASIC METABOLIC PANEL
Anion gap: 16 — ABNORMAL HIGH (ref 5–15)
BUN: 81 mg/dL — ABNORMAL HIGH (ref 8–23)
CO2: 23 mmol/L (ref 22–32)
Calcium: 8.8 mg/dL — ABNORMAL LOW (ref 8.9–10.3)
Chloride: 100 mmol/L (ref 98–111)
Creatinine, Ser: 6.79 mg/dL — ABNORMAL HIGH (ref 0.44–1.00)
GFR, Estimated: 6 mL/min — ABNORMAL LOW (ref >60)
Glucose, Bld: 99 mg/dL (ref 70–99)
Potassium: 5.1 mmol/L (ref 3.5–5.1)
Sodium: 139 mmol/L (ref 135–145)

## 2020-12-12 LAB — HEPATIC FUNCTION PANEL
ALT: 16 U/L (ref 0–44)
AST: 20 U/L (ref 15–41)
Albumin: 3.6 g/dL (ref 3.5–5.0)
Alkaline Phosphatase: 112 U/L (ref 38–126)
Bilirubin, Direct: <0.1 mg/dL (ref 0.0–0.2)
Total Bilirubin: 0.6 mg/dL (ref 0.3–1.2)
Total Protein: 7.1 g/dL (ref 6.5–8.1)

## 2020-12-12 LAB — CK: Total CK: 160 U/L (ref 38–234)

## 2020-12-12 LAB — CBC
HCT: 35.2 % — ABNORMAL LOW (ref 36.0–46.0)
Hemoglobin: 11.6 g/dL — ABNORMAL LOW (ref 12.0–15.0)
MCH: 32.3 pg (ref 26.0–34.0)
MCHC: 33 g/dL (ref 30.0–36.0)
MCV: 98.1 fL (ref 80.0–100.0)
Platelets: 324 10*3/uL (ref 150–400)
RBC: 3.59 MIL/uL — ABNORMAL LOW (ref 3.87–5.11)
RDW: 12.9 % (ref 11.5–15.5)
WBC: 8 10*3/uL (ref 4.0–10.5)
nRBC: 0 % (ref 0.0–0.2)

## 2020-12-12 LAB — LIPASE, BLOOD: Lipase: 44 U/L (ref 11–51)

## 2020-12-12 LAB — MAGNESIUM: Magnesium: 2.4 mg/dL (ref 1.7–2.4)

## 2020-12-12 LAB — TROPONIN I (HIGH SENSITIVITY)
Troponin I (High Sensitivity): 22 ng/L — ABNORMAL HIGH (ref <18)
Troponin I (High Sensitivity): 24 ng/L — ABNORMAL HIGH (ref <18)

## 2020-12-12 LAB — D-DIMER, QUANTITATIVE: D-Dimer, Quant: 5.57 ug/mL-FEU — ABNORMAL HIGH (ref 0.00–0.50)

## 2020-12-12 MED ORDER — DIAZEPAM 5 MG/ML IJ SOLN
2.5000 mg | Freq: Once | INTRAMUSCULAR | Status: AC
  Administered 2020-12-12: 2.5 mg via INTRAVENOUS
  Filled 2020-12-12: qty 2

## 2020-12-12 MED ORDER — CYCLOBENZAPRINE HCL 5 MG PO TABS: 5.0000 mg | ORAL_TABLET | Freq: Three times a day (TID) | ORAL | 0 refills | Status: AC | PRN

## 2020-12-12 MED ORDER — DOXYCYCLINE MONOHYDRATE 100 MG PO TABS: 100.0000 mg | ORAL_TABLET | Freq: Two times a day (BID) | ORAL | 0 refills | Status: AC

## 2020-12-12 MED ORDER — ACETAMINOPHEN 500 MG PO TABS
1000.0000 mg | ORAL_TABLET | Freq: Once | ORAL | Status: AC
  Administered 2020-12-12: 1000 mg via ORAL
  Filled 2020-12-12: qty 2

## 2020-12-12 MED ORDER — HYDROMORPHONE HCL 1 MG/ML IJ SOLN
0.5000 mg | Freq: Once | INTRAMUSCULAR | Status: AC
  Administered 2020-12-12: 0.5 mg via INTRAVENOUS
  Filled 2020-12-12: qty 1

## 2020-12-12 MED ORDER — ONDANSETRON HCL 4 MG/2ML IJ SOLN
4.0000 mg | Freq: Once | INTRAMUSCULAR | Status: AC
  Administered 2020-12-12: 4 mg via INTRAVENOUS
  Filled 2020-12-12: qty 2

## 2020-12-12 MED ORDER — IOHEXOL 350 MG/ML SOLN
80.0000 mL | Freq: Once | INTRAVENOUS | Status: AC | PRN
  Administered 2020-12-12: 80 mL via INTRAVENOUS
  Filled 2020-12-12: qty 80

## 2020-12-12 MED ORDER — LIDOCAINE 5 % EX PTCH: 1.0000 | MEDICATED_PATCH | Freq: Two times a day (BID) | CUTANEOUS | 0 refills | Status: AC

## 2020-12-12 MED ORDER — LIDOCAINE 5 % EX PTCH
1.0000 | MEDICATED_PATCH | CUTANEOUS | Status: DC
  Administered 2020-12-12: 1 via TRANSDERMAL
  Filled 2020-12-12: qty 1

## 2020-12-12 NOTE — ED Notes (Addendum)
Pt sleeping  md aware.  Pt was requesting meds earlier, but on hold for now while pt sleeping

## 2020-12-12 NOTE — ED Notes (Signed)
Pt requesting meds now, meds given  siderails up x 2.

## 2020-12-12 NOTE — ED Triage Notes (Signed)
Patient presents to ER with right sided flank pain since 0200 today. Patient reports hx of back and flank pain. Reports stage 4 kidney disease. Dialysis on T, TH, S. Patient reports hx of kidney stones. Patient A&Ox3.

## 2020-12-12 NOTE — ED Notes (Signed)
Pt signed esignature.  D/c inst to pt.  Iv dc'ed.   

## 2020-12-12 NOTE — ED Provider Notes (Signed)
[facility] Emergency Department Provider Note  ____________________________________________   Event Date/Time   First MD Initiated Contact with Patient 12/12/20 1305     (approximate)  I have reviewed the triage vital signs and the nursing notes.   HISTORY  Chief Complaint Flank Pain    HPI Katie [name] is a 65 y.o. female with ESRD on dialysis Tuesdays Thursdays Saturdays, diastolic CHF who comes in with concern for flank pain.  Patient reports that she was having back spasms on the right side for the past 2 weeks.  She states that the lidocaine patches and the Flexeril were helping somewhat but yesterday she reports having a fall because she just felt weak and landed on her right side and the spasms came back, worse.  Denies any midline back tenderness, weakness in his legs, new numbness in her legs, saddle anesthesia, urinary or rectal incontinence.  She states that the pain is worse when she takes a deep breath but she is not really sure if she feels actually short of breath or infectious because of the pain.  The pain is intermittent, not better with above, worse with movements and comes on randomly.  On review of records patient was seen 7/27 for flank pain on the right.  At this time she had a negative work-up including negative CT scan renal.  Patient was prescribed Flexeril and Lidoderm patch.  Patient also had some unremarkable work-up for similar pain on 7/15 which included a CT renal.          Past Medical History:  Diagnosis Date   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[place])    Hypertension    Renal disorder    Type 2 diabetes mellitus ([place])     Patient Active Problem List   Diagnosis Date Noted   Persistent headaches 06/27/2020   ATN (acute tubular necrosis) ([place])    Pulmonary hypertension, unspecified (HCC)    Chronic kidney disease (CKD), stage IV (severe) (HCC)    Pleural effusion    Acute heart failure with preserved  ejection fraction (HFpEF) (HCC)    Pressure injury of skin 05/01/2020   Hyperkalemia    Volume overload 04/30/2020   Type 2 diabetes mellitus with stage 5 chronic kidney disease ([place]) 04/30/2020   Hypertension    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HCC)    Type 2 diabetes mellitus ([place])     Past Surgical History:  Procedure Laterality Date   APPENDECTOMY     DIALYSIS/PERMA CATHETER INSERTION N/A 05/16/2020   Procedure: DIALYSIS/PERMA CATHETER INSERTION;  Surgeon: Algernon Huxley, MD;  Location: [place] [facility];  Service: Cardiovascular;  Laterality: N/A;   MANDIBLE SURGERY     RENAL BIOPSY Right    RIGHT HEART CATH N/A 05/10/2020   Procedure: RIGHT HEART CATH;  Surgeon: Nelva Bush, MD;  Location: [facility];  Service: Cardiovascular;  Laterality: N/A;   TEMPORARY DIALYSIS CATHETER N/A 05/11/2020   Procedure: TEMPORARY DIALYSIS CATHETER;  Surgeon: Algernon Huxley, MD;  Location: [facility];  Service: Cardiovascular;  Laterality: N/A;    Prior to Admission medications   Medication Sig Start Date End Date Taking? Authorizing Provider  carvedilol (COREG) 25 MG tablet Take 1 tablet (25 mg total) by mouth 2 (two) times daily with a meal. 07/02/20 08/01/20  Nolberto Hanlon, MD  cetirizine (ZYRTEC) 10 MG tablet Take 10 mg by mouth daily.    [provider]  cyclobenzaprine (FLEXERIL) 5 MG tablet Take 1 tablet (5 mg total) by mouth 3 (three) times daily as needed. 12/07/20   Blake Divine, MD  folic acid (FOLVITE) 1 MG tablet Take 1 mg by mouth daily.    [provider]  lidocaine (LIDODERM) 5 % Place 1 patch onto the skin every 12 (twelve) hours. Remove & Discard patch within 12 hours or as directed by MD 12/07/20 12/07/21  Blake Divine, MD  topiramate (TOPAMAX) 50 MG tablet Take 1 tablet (50 mg total) by mouth 2 (two) times daily. 07/02/20 08/01/20  Nolberto Hanlon, MD  torsemide (DEMADEX) 10 MG tablet Take 1 tablet (10 mg total) by mouth daily.  07/03/20 08/02/20  Nolberto Hanlon, MD  traZODone (DESYREL) 50 MG tablet Take 1 tablet (50 mg total) by mouth every evening. 07/02/20 08/01/20  Nolberto Hanlon, MD  venlafaxine (EFFEXOR) 37.5 MG tablet Take 1 tablet (37.5 mg total) by mouth 2 (two) times daily with a meal. 07/02/20 08/01/20  Nolberto Hanlon, MD  verapamil (CALAN-SR) 240 MG CR tablet Take 1 tablet (240 mg total) by mouth at bedtime. 07/02/20 08/01/20  Nolberto Hanlon, MD    Allergies Penicillins  Family History  Problem Relation Age of Onset   Heart attack Mother    Asthma Mother    Uterine cancer Mother    Skin cancer Father    Prostate cancer Father    CAD Father    Parkinson's disease Father    Lung cancer Brother     Social History Social History   Tobacco Use   Smoking status: Former   Smokeless tobacco: Never  Scientific laboratory technician Use: Never used  Substance Use Topics   Alcohol use: Not Currently      Review of Systems Constitutional: No fever/chills Eyes: No visual changes. ENT: No sore throat. Cardiovascular: Denies chest pain. Respiratory: + pain with breathing  Gastrointestinal: No abdominal pain.  No nausea, no vomiting.  No diarrhea.  No constipation. Genitourinary: Negative for dysuria. Musculoskeletal:  + back pain  Skin: Negative for rash. Neurological: Negative for headaches, focal weakness or numbness. All other ROS negative ____________________________________________   PHYSICAL EXAM:  VITAL SIGNS: ED Triage Vitals [12/12/20 1139]  Enc Vitals Group     BP (!) 179/98     Pulse Rate 98     Resp 18     Temp 98.2 F (36.8 C)     Temp src      SpO2 98 %     Weight 171 lb 15.3 oz (78 kg)     Height '5\' 6"'$  (1.676 m)     Head Circumference      Peak Flow      Pain Score 7     Pain Loc      Pain Edu?      Excl. in [place]?     Constitutional: Alert and oriented.  Patient appears comfortable and then intermittently will start clutching the right side of her back due to feeling a spasm Eyes:  Conjunctivae are normal. EOMI. Head: Atraumatic. Nose: No congestion/rhinnorhea. Mouth/Throat: Mucous membranes are moist.   Neck: No stridor. Trachea Midline. FROM Cardiovascular: Normal rate, regular rhythm. Grossly normal heart sounds.  Good peripheral circulation. Respiratory: Normal respiratory effort.  No retractions. Lungs CTAB. Gastrointestinal: Soft and nontender. No distention. No abdominal bruits.  Musculoskeletal: No lower extremity tenderness nor edema. Equal strength in legs. No numbness (other then baseline neuropathy in feet)  No joint effusions. Neurologic:  Normal speech and  language. No gross focal neurologic deficits are appreciated.  Skin:  Skin is warm, dry and intact. No rash noted. Psychiatric: Mood and affect are normal. Speech and behavior are normal. GU: Deferred  Back: NO CTL spine tenderness, pain on the R flank  ____________________________________________   LABS (all labs ordered are listed, but only abnormal results are displayed)  Labs Reviewed  BASIC METABOLIC PANEL - Abnormal; Notable for the following components:      Result Value   BUN 81 (*)    Creatinine, Ser 6.79 (*)    Calcium 8.8 (*)    GFR, Estimated 6 (*)    Anion gap 16 (*)    All other components within normal limits  CBC - Abnormal; Notable for the following components:   RBC 3.59 (*)    Hemoglobin 11.6 (*)    HCT 35.2 (*)    All other components within normal limits  URINALYSIS, ROUTINE W REFLEX MICROSCOPIC   ____________________________________________   ED ECG REPORT I, Vanessa [place], the attending physician, personally viewed and interpreted this ECG.  Normal sinus rate of 97, no ST elevation, T wave version lead III and aVF, normal intervals ____________________________________________  RADIOLOGY   Official radiology report(s): CT Angio Chest PE W and/or Wo Contrast  Result Date: 12/12/2020 CLINICAL DATA:  PE suspected, back and flank pain, shortness of breath,  dialysis EXAM: CT ANGIOGRAPHY CHEST WITH CONTRAST TECHNIQUE: Multidetector CT imaging of the chest was performed using the standard protocol during bolus administration of intravenous contrast. Multiplanar CT image reconstructions and MIPs were obtained to evaluate the vascular anatomy. CONTRAST:  14m OMNIPAQUE IOHEXOL 350 MG/ML SOLN COMPARISON:  None. FINDINGS: Cardiovascular: Right chest large bore multi lumen vascular catheter. Examination for pulmonary embolism is limited by marginal contrast bolus, main pulmonary artery HU = 172). Within this limitation, no evidence of pulmonary embolism through the proximal segmental pulmonary arterial level. Normal heart size. Three-vessel coronary artery calcifications. No pericardial effusion. Aortic atherosclerosis. Mediastinum/Nodes: No enlarged mediastinal, hilar, or axillary lymph nodes. Thyroid gland, trachea, and esophagus demonstrate no significant findings. Lungs/Pleura: Small bilateral pleural effusions, with extensive bronchial plugging and associated atelectasis or consolidation of the left lower lobe (series 5, image 223). Upper Abdomen: Please see separately reported examination of the abdomen and pelvis. Musculoskeletal: No chest wall abnormality. No acute or significant osseous findings. Review of the MIP images confirms the above findings. IMPRESSION: 1. Examination for pulmonary embolism is limited by marginal contrast bolus. Within this limitation, no evidence of pulmonary embolism through the proximal segmental pulmonary arterial level. 2. Small bilateral pleural effusions, with extensive bronchial plugging and associated atelectasis or consolidation of the left lower lobe. Findings are concerning for infection or aspiration. 3. Coronary artery disease. Aortic Atherosclerosis (ICD10-I70.0). Electronically Signed   By: AEddie CandleM.D.   On: 12/12/2020 16:51   CT ABDOMEN PELVIS W CONTRAST  Result Date: 12/12/2020 CLINICAL DATA:  Right-sided flank  pain, history of kidney stones, dialysis EXAM: CT ABDOMEN AND PELVIS WITH CONTRAST TECHNIQUE: Multidetector CT imaging of the abdomen and pelvis was performed using the standard protocol following bolus administration of intravenous contrast. CONTRAST:  811mOMNIPAQUE IOHEXOL 350 MG/ML SOLN COMPARISON:  CT abdomen pelvis, 12/07/2020 FINDINGS: Lower chest: Please see separately reported examination of the chest. Hepatobiliary: No solid liver abnormality is seen. No gallstones, gallbladder wall thickening, or biliary dilatation. Pancreas: Unremarkable. No pancreatic ductal dilatation or surrounding inflammatory changes. Spleen: Normal in size without significant abnormality. Adrenals/Urinary Tract: Adrenal glands are unremarkable. Renal  vascular calcifications are present without convincing renal calculi. No ureteral calculi or hydronephrosis. Bladder is unremarkable. Stomach/Bowel: Stomach is within normal limits. Appendix is not clearly visualized and may be surgically absent. No evidence of bowel wall thickening, distention, or inflammatory changes. Large burden of stool throughout the colon and rectum. Sigmoid diverticulosis. Vascular/Lymphatic: Aortic atherosclerosis. No enlarged abdominal or pelvic lymph nodes. Reproductive: No mass or other significant abnormality. Other: No abdominal wall hernia or abnormality. No abdominopelvic ascites. Musculoskeletal: No acute or significant osseous findings. Unchanged skin thickening and soft tissue stranding overlying the coccyx (series 2, image 81). IMPRESSION: 1. No acute CT findings of the abdomen or pelvis to explain right-sided flank pain. No evidence of urinary tract calculus or hydronephrosis. 2. Large burden of stool throughout the colon and rectum. 3. Sigmoid diverticulosis without evidence of acute diverticulitis. 4. Unchanged skin thickening and soft tissue stranding overlying the coccyx. Correlate for evidence of decubitus ulceration. Aortic Atherosclerosis  (ICD10-I70.0). Electronically Signed   By: Eddie Candle M.D.   On: 12/12/2020 16:55    ____________________________________________   PROCEDURES  Procedure(s) performed (including Critical Care):  Procedures   ____________________________________________   INITIAL IMPRESSION / ASSESSMENT AND PLAN / ED COURSE  Kandiss Holyoak was evaluated in Emergency Department on 12/12/2020 for the symptoms described in the history of present illness. She was evaluated in the context of the global COVID-19 pandemic, which necessitated consideration that the patient might be at risk for infection with the SARS-CoV-2 virus that causes COVID-19. Institutional protocols and algorithms that pertain to the evaluation of patients at risk for COVID-19 are in a state of rapid change based on information released by regulatory bodies including the CDC and federal and state organizations. These policies and algorithms were followed during the patient's care in the ED.    Patient is a 65 year old who comes in with continued back pain.  Discussed with patient that this is now her third visit and I am trying to figure out that there is anything else that we missed.  Given she does report a pleuritic component to it we can get D-dimer to evaluate for PE.  Repeat EKG cardiac markers were ordered to evaluate for ACS.  EKG did show some inverted T waves in lead III and aVF therefore we will carefully follow the cardiac markers.  Patient already had a chest x-ray did not show any evidence of large effusion or pneumonia to explain pain.  She is already had multiple CT renals without evidence of kidney stone.  D-dimer is elevated therefore we will proceed with CT PE.  Patient's been on dialysis since December and so she will be will have the contrast taken out tomorrow.  Also had a CT abdomen with contrast given this has not been done to make sure is nothing else that could be causing her her discomfort.  She has no symptoms of cord  compression is no midline tenderness to suggest a primary back issue requiring MRIs.  I gave patient 2.5 mg of IV Valium and she reports that her symptoms are still there so we will give 1 dose of IV pain medication while awaiting CT scans To note patient does report a fall yesterday but she is adamant that she did not hit her head and denies any cervical spine tenderness.  Troponins were slightly elevated but most likely just secondary to her underlying CKD given they were not progressively increasing.  Given her CT scan does show some coronary artery disease I will have her  follow-up outpatient with cardiology.  Prior to giving the Dilaudid patient was reevaluated by the nurse multiple times and noted to be sleeping therefore the IV Dilaudid was not given additionally.  However when the nurse later went back into the room she stated that she was still having pain and so got 1 dose of IV Dilaudid I went to reevaluate patient she was initially sleeping but woke up and stated that she was still having pain when she was awake.  Explained to patient that given her CT scans are reassuring that there was not any great medication to make the pain go away completely and that she could still have some muscle spasming and its management as best as we can with the lidocaine patches and Flexeril at home.  We will give her a course of antibiotics for the concern for potential infection on her CT scan given she does report a cough but she denies any history of aspiration.  Also recommended that she take MiraLAX to help with constipation.  Patient states that she is out of lidocaine patches and the Flexeril is almost done.  She is reports that she has other prescriptions at the [facility] but she is not able to get there.  Explained to patient that she can have her transferred over.  She expressed that she has been trying to do that for a few days but there is been issues with transferring them and is requesting new  prescriptions to the [place].  We will provide a short course, course of antibiotics.  Patient feels comfortable with discharge.  Patient is ambulatory with her walker.         ____________________________________________   FINAL CLINICAL IMPRESSION(S) / ED DIAGNOSES   Final diagnoses:  Community acquired pneumonia of left lung, unspecified part of lung  Back spasm  ESRD (end stage renal disease) ([place])      MEDICATIONS GIVEN DURING THIS VISIT:  Medications  lidocaine (LIDODERM) 5 % 1 patch (1 patch Transdermal Patch Applied 12/12/20 1344)  acetaminophen (TYLENOL) tablet 1,000 mg (1,000 mg Oral Given 12/12/20 1344)  diazepam (VALIUM) injection 2.5 mg (2.5 mg Intravenous Given 12/12/20 1450)  HYDROmorphone (DILAUDID) injection 0.5 mg (0.5 mg Intravenous Given 12/12/20 1658)  ondansetron (ZOFRAN) injection 4 mg (4 mg Intravenous Given 12/12/20 1659)  iohexol (OMNIPAQUE) 350 MG/ML injection 80 mL (80 mLs Intravenous Contrast Given 12/12/20 1612)     ED Discharge Orders          Ordered    doxycycline (ADOXA) 100 MG tablet  2 times daily        12/12/20 1724    lidocaine (LIDODERM) 5 %  Every 12 hours        12/12/20 1724    cyclobenzaprine (FLEXERIL) 5 MG tablet  3 times daily PRN        12/12/20 1724             Note:  This document was prepared using Dragon voice recognition software and may include unintentional dictation errors.    Vanessa Rialto, MD 12/12/20 [phone]

## 2020-12-12 NOTE — Discharge Instructions (Addendum)
Your work-up was reassuring.  You should stay quarantine to your COVID test comes back.  You should take the antibiotics for the possible pneumonia and continue the Lidoderm patches and the Flexeril.  She can take Tylenol 1 g every 8 hours also help with your pain.  Return to the ER for worsening symptoms or any other concern  Given your CT scan does show some incidental finding of coronary disease you can follow-up with cardiology for further work-up of your heart   IMPRESSION:  1. Examination for pulmonary embolism is limited by marginal  contrast bolus. Within this limitation, no evidence of pulmonary  embolism through the proximal segmental pulmonary arterial level.  2. Small bilateral pleural effusions, with extensive bronchial  plugging and associated atelectasis or consolidation of the left  lower lobe. Findings are concerning for infection or aspiration.  3. Coronary artery disease.

## 2020-12-14 ENCOUNTER — Other Ambulatory Visit: Payer: Self-pay

## 2020-12-14 DIAGNOSIS — R519 Headache, unspecified: Secondary | ICD-10-CM | POA: Diagnosis present

## 2020-12-14 DIAGNOSIS — J90 Pleural effusion, not elsewhere classified: Secondary | ICD-10-CM | POA: Diagnosis not present

## 2020-12-14 DIAGNOSIS — R109 Unspecified abdominal pain: Secondary | ICD-10-CM | POA: Diagnosis not present

## 2020-12-14 DIAGNOSIS — E1122 Type 2 diabetes mellitus with diabetic chronic kidney disease: Secondary | ICD-10-CM | POA: Insufficient documentation

## 2020-12-14 DIAGNOSIS — Z992 Dependence on renal dialysis: Secondary | ICD-10-CM | POA: Diagnosis not present

## 2020-12-14 DIAGNOSIS — N186 End stage renal disease: Secondary | ICD-10-CM | POA: Diagnosis not present

## 2020-12-14 DIAGNOSIS — G44309 Post-traumatic headache, unspecified, not intractable: Secondary | ICD-10-CM | POA: Diagnosis not present

## 2020-12-14 DIAGNOSIS — I132 Hypertensive heart and chronic kidney disease with heart failure and with stage 5 chronic kidney disease, or end stage renal disease: Secondary | ICD-10-CM | POA: Insufficient documentation

## 2020-12-14 DIAGNOSIS — I5031 Acute diastolic (congestive) heart failure: Secondary | ICD-10-CM | POA: Insufficient documentation

## 2020-12-14 DIAGNOSIS — Z87891 Personal history of nicotine dependence: Secondary | ICD-10-CM | POA: Insufficient documentation

## 2020-12-14 DIAGNOSIS — M545 Low back pain, unspecified: Secondary | ICD-10-CM | POA: Insufficient documentation

## 2020-12-14 DIAGNOSIS — K5900 Constipation, unspecified: Secondary | ICD-10-CM | POA: Diagnosis not present

## 2020-12-14 DIAGNOSIS — R0789 Other chest pain: Secondary | ICD-10-CM | POA: Insufficient documentation

## 2020-12-14 LAB — COMPREHENSIVE METABOLIC PANEL
ALT: 17 U/L (ref 0–44)
AST: 19 U/L (ref 15–41)
Albumin: 3.6 g/dL (ref 3.5–5.0)
Alkaline Phosphatase: 99 U/L (ref 38–126)
Anion gap: 14 (ref 5–15)
BUN: 54 mg/dL — ABNORMAL HIGH (ref 8–23)
CO2: 27 mmol/L (ref 22–32)
Calcium: 8.4 mg/dL — ABNORMAL LOW (ref 8.9–10.3)
Chloride: 96 mmol/L — ABNORMAL LOW (ref 98–111)
Creatinine, Ser: 6.78 mg/dL — ABNORMAL HIGH (ref 0.44–1.00)
GFR, Estimated: 6 mL/min — ABNORMAL LOW (ref >60)
Glucose, Bld: 102 mg/dL — ABNORMAL HIGH (ref 70–99)
Potassium: 4.4 mmol/L (ref 3.5–5.1)
Sodium: 137 mmol/L (ref 135–145)
Total Bilirubin: 0.8 mg/dL (ref 0.3–1.2)
Total Protein: 7.6 g/dL (ref 6.5–8.1)

## 2020-12-14 LAB — CBC
HCT: 35.1 % — ABNORMAL LOW (ref 36.0–46.0)
Hemoglobin: 11.3 g/dL — ABNORMAL LOW (ref 12.0–15.0)
MCH: 31.7 pg (ref 26.0–34.0)
MCHC: 32.2 g/dL (ref 30.0–36.0)
MCV: 98.6 fL (ref 80.0–100.0)
Platelets: 345 10*3/uL (ref 150–400)
RBC: 3.56 MIL/uL — ABNORMAL LOW (ref 3.87–5.11)
RDW: 13 % (ref 11.5–15.5)
WBC: 8.6 10*3/uL (ref 4.0–10.5)
nRBC: 0 % (ref 0.0–0.2)

## 2020-12-14 NOTE — ED Triage Notes (Signed)
Pt in with co bilat flank pain for few weeks. Hx of stage 4 kidney disease due for dialysis tomorrow. Pt also has chronic back pain, states pain feels like spasms.

## 2020-12-15 ENCOUNTER — Emergency Department
Admission: EM | Admit: 2020-12-15 | Discharge: 2020-12-15 | Disposition: A | Discharge status: Home / Self Care | Payer: Medicaid Other | Attending: Emergency Medicine | Admitting: Emergency Medicine

## 2020-12-15 ENCOUNTER — Emergency Department: Payer: Medicaid Other

## 2020-12-15 DIAGNOSIS — R079 Chest pain, unspecified: Secondary | ICD-10-CM

## 2020-12-15 DIAGNOSIS — R109 Unspecified abdominal pain: Secondary | ICD-10-CM

## 2020-12-15 DIAGNOSIS — G44309 Post-traumatic headache, unspecified, not intractable: Secondary | ICD-10-CM

## 2020-12-15 DIAGNOSIS — N186 End stage renal disease: Secondary | ICD-10-CM

## 2020-12-15 LAB — TROPONIN I (HIGH SENSITIVITY)
Troponin I (High Sensitivity): 15 ng/L (ref <18)
Troponin I (High Sensitivity): 16 ng/L (ref <18)

## 2020-12-15 MED ORDER — CYCLOBENZAPRINE HCL 10 MG PO TABS
10.0000 mg | ORAL_TABLET | Freq: Once | ORAL | Status: AC
  Administered 2020-12-15: 10 mg via ORAL
  Filled 2020-12-15: qty 1

## 2020-12-15 MED ORDER — LIDOCAINE 5 % EX PTCH
1.0000 | MEDICATED_PATCH | CUTANEOUS | Status: DC
  Administered 2020-12-15: 1 via TRANSDERMAL
  Filled 2020-12-15: qty 1

## 2020-12-15 MED ORDER — OXYCODONE HCL 5 MG PO TABS
5.0000 mg | ORAL_TABLET | ORAL | Status: AC
Start: 2020-12-15 — End: 2020-12-15
  Administered 2020-12-15: 5 mg via ORAL
  Filled 2020-12-15: qty 1

## 2020-12-15 MED ORDER — ACETAMINOPHEN 500 MG PO TABS
1000.0000 mg | ORAL_TABLET | Freq: Once | ORAL | Status: AC
  Administered 2020-12-15: 1000 mg via ORAL
  Filled 2020-12-15: qty 2

## 2020-12-15 NOTE — ED Notes (Signed)
Went to discharge pt who states she is still having pain in her back and now into her calves- Dr Tamala Julian made aware and went to speak with pt

## 2020-12-15 NOTE — ED Notes (Signed)
Dr. Dazha Kempa at bedside.

## 2020-12-15 NOTE — ED Notes (Signed)
Pt taken for scans 

## 2020-12-15 NOTE — ED Provider Notes (Signed)
[facility] Emergency Department Provider Note  ____________________________________________   Event Date/Time   First MD Initiated Contact with Patient 12/15/20 0732     (approximate)  I have reviewed the triage vital signs and the nursing notes.   HISTORY  Chief Complaint Flank Pain   HPI Katie [name] is a 65 y.o. female with past medical history of HTN, DM, ESRD on HD Tuesday Thursday Friday without recently missed sessions, CHF headaches and subacute back pain evaluated emergency room on 7/15, 7/21 at 8/1 who presents with several complaints including headache, chest tightness and some persistent crampy back pain more on the right lower aspect anywhere else.  Patient states she thinks she tripped and fell hitting her head couple days ago but does not think she hit her back.  She has no new weakness or numbness in her arms or legs.  She has not had any new cough, shortness of breath, fevers, nausea, vomiting diarrhea or rash.  She denies any incontinence or new numbness.  She states she was unable to get her Flexeril because it was not sent to the proper pharmacy that was prescribed last time and she received on the first.  States he was taking Tylenol but does not minimally.  She is also not been able to take MiraLAX and is nursing fairly significant constipation.         Past Medical History:  Diagnosis Date   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[place])    Hypertension    Renal disorder    Type 2 diabetes mellitus ([place])     Patient Active Problem List   Diagnosis Date Noted   Persistent headaches 06/27/2020   ATN (acute tubular necrosis) ([place])    Pulmonary hypertension, unspecified (HCC)    Chronic kidney disease (CKD), stage IV (severe) (HCC)    Pleural effusion    Acute heart failure with preserved ejection fraction (HFpEF) ([place])    Pressure injury of skin 05/01/2020   Hyperkalemia    Volume overload 04/30/2020   Type 2 diabetes  mellitus with stage 5 chronic kidney disease ([place]) 04/30/2020   Hypertension    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HCC)    Type 2 diabetes mellitus ([place])     Past Surgical History:  Procedure Laterality Date   APPENDECTOMY     DIALYSIS/PERMA CATHETER INSERTION N/A 05/16/2020   Procedure: DIALYSIS/PERMA CATHETER INSERTION;  Surgeon: Algernon Huxley, MD;  Location: [place] [facility];  Service: Cardiovascular;  Laterality: N/A;   MANDIBLE SURGERY     RENAL BIOPSY Right    RIGHT HEART CATH N/A 05/10/2020   Procedure: RIGHT HEART CATH;  Surgeon: Nelva Bush, MD;  Location: [place] [facility];  Service: Cardiovascular;  Laterality: N/A;   TEMPORARY DIALYSIS CATHETER N/A 05/11/2020   Procedure: TEMPORARY DIALYSIS CATHETER;  Surgeon: Algernon Huxley, MD;  Location: [place] [facility];  Service: Cardiovascular;  Laterality: N/A;    Prior to Admission medications   Medication Sig Start Date End Date Taking? Authorizing Provider  carvedilol (COREG) 25 MG tablet Take 1 tablet (25 mg total) by mouth 2 (two) times daily with a meal. 07/02/20 08/01/20  Nolberto Hanlon, MD  cetirizine (ZYRTEC) 10 MG tablet Take 10 mg by mouth daily.    [provider]  cyclobenzaprine (FLEXERIL) 5 MG tablet Take 1 tablet (5 mg total) by mouth 3 (three) times daily as needed for up to 5 days for muscle  spasms. 12/12/20 12/17/20  Vanessa Urie, MD  doxycycline (ADOXA) 100 MG tablet Take 1 tablet (100 mg total) by mouth 2 (two) times daily for 7 days. 12/12/20 12/19/20  Vanessa Antimony, MD  folic acid (FOLVITE) 1 MG tablet Take 1 mg by mouth daily.    [provider]  lidocaine (LIDODERM) 5 % Place 1 patch onto the skin every 12 (twelve) hours for 10 days. Remove & Discard patch within 12 hours or as directed by MD 12/12/20 12/22/20  Vanessa Byhalia, MD  topiramate (TOPAMAX) 50 MG tablet Take 1 tablet (50 mg total) by mouth 2 (two) times daily. 07/02/20 08/01/20  Nolberto Hanlon, MD  torsemide (DEMADEX) 10 MG tablet  Take 1 tablet (10 mg total) by mouth daily. 07/03/20 08/02/20  Nolberto Hanlon, MD  traZODone (DESYREL) 50 MG tablet Take 1 tablet (50 mg total) by mouth every evening. 07/02/20 08/01/20  Nolberto Hanlon, MD  venlafaxine (EFFEXOR) 37.5 MG tablet Take 1 tablet (37.5 mg total) by mouth 2 (two) times daily with a meal. 07/02/20 08/01/20  Nolberto Hanlon, MD  verapamil (CALAN-SR) 240 MG CR tablet Take 1 tablet (240 mg total) by mouth at bedtime. 07/02/20 08/01/20  Nolberto Hanlon, MD    Allergies Penicillins  Family History  Problem Relation Age of Onset   Heart attack Mother    Asthma Mother    Uterine cancer Mother    Skin cancer Father    Prostate cancer Father    CAD Father    Parkinson's disease Father    Lung cancer Brother     Social History Social History   Tobacco Use   Smoking status: Former   Smokeless tobacco: Never  Scientific laboratory technician Use: Never used  Substance Use Topics   Alcohol use: Not Currently    Review of Systems  Review of Systems  Constitutional:  Negative for chills and fever.  HENT:  Negative for sore throat.   Eyes:  Negative for pain.  Respiratory:  Negative for cough and stridor.   Cardiovascular:  Positive for chest pain.  Gastrointestinal:  Positive for constipation. Negative for vomiting.  Genitourinary:  Positive for flank pain. Negative for dysuria.  Musculoskeletal:  Positive for back pain.  Skin:  Negative for rash.  Neurological:  Positive for headaches. Negative for seizures and loss of consciousness.  Psychiatric/Behavioral:  Negative for suicidal ideas.   All other systems reviewed and are negative.    ____________________________________________   PHYSICAL EXAM:  VITAL SIGNS: ED Triage Vitals  Enc Vitals Group     BP 12/14/20 2258 (!) 153/92     Pulse Rate 12/14/20 2258 (!) 102     Resp 12/14/20 2258 20     Temp 12/14/20 2258 98.6 F (37 C)     Temp Source 12/14/20 2258 Oral     SpO2 12/14/20 2258 98 %     Weight 12/14/20 2256 170 lb  (77.1 kg)     Height 12/14/20 2256 '5\' 6"'$  (1.676 m)     Head Circumference --      Peak Flow --      Pain Score 12/14/20 2256 8     Pain Loc --      Pain Edu? --      Excl. in [place]? --    Vitals:   12/15/20 1012 12/15/20 1030  BP:  (!) 164/82  Pulse: 98 96  Resp: 14 12  Temp:    SpO2: 96% 92%   Physical Exam  Vitals and nursing note reviewed.  Constitutional:      General: She is not in acute distress.    Appearance: She is well-developed.  HENT:     Head: Normocephalic and atraumatic.     Right Ear: External ear normal.     Left Ear: External ear normal.     Nose: Nose normal.  Eyes:     Conjunctiva/sclera: Conjunctivae normal.  Cardiovascular:     Rate and Rhythm: Normal rate and regular rhythm.     Heart sounds: No murmur heard. Pulmonary:     Effort: Pulmonary effort is normal. No respiratory distress.     Breath sounds: Normal breath sounds.  Abdominal:     Palpations: Abdomen is soft.     Tenderness: There is no abdominal tenderness.  Musculoskeletal:     Cervical back: Neck supple.  Skin:    General: Skin is warm and dry.     Capillary Refill: Capillary refill takes less than 2 seconds.  Neurological:     Mental Status: She is alert and oriented to person, place, and time.  Psychiatric:        Mood and Affect: Mood normal.    Is little erythema over the forehead.  Cranial nerves II through XII grossly intact.  Patient is symmetric strength in upper and lower extremities.  Sensation is intact light touch of lower extremities.  There is no significant midline tenderness over the C/T/L-spine but significant tenderness over the right paralumbar muscles.  No overlying skin changes.  No significant left CVA or paralumbar tenderness.  2+ radial pulse.  No other trauma visible to the face scalp head or neck or extremities.  ____________________________________________   LABS (all labs ordered are listed, but only abnormal results are displayed)  Labs Reviewed  CBC -  Abnormal; Notable for the following components:      Result Value   RBC 3.56 (*)    Hemoglobin 11.3 (*)    HCT 35.1 (*)    All other components within normal limits  COMPREHENSIVE METABOLIC PANEL - Abnormal; Notable for the following components:   Chloride 96 (*)    Glucose, Bld 102 (*)    BUN 54 (*)    Creatinine, Ser 6.78 (*)    Calcium 8.4 (*)    GFR, Estimated 6 (*)    All other components within normal limits  URINALYSIS, COMPLETE (UACMP) WITH MICROSCOPIC  TROPONIN I (HIGH SENSITIVITY)  TROPONIN I (HIGH SENSITIVITY)   ____________________________________________  EKG  Sinus rhythm with ventricular to 96, some artifact without clearance of acute ischemia or significant arrhythmia. ____________________________________________  RADIOLOGY  ED MD interpretation: Chest x-ray has similarly seen on prior imaging small bilateral pleural effusions without focal consolidation, pneumothorax, rib fracture, or other clear acute intrathoracic process.  CT head shows no evidence of skull fracture or intracranial hemorrhage or other clear acute intracranial process.   Official radiology report(s): DG Chest 2 View  Result Date: 12/15/2020 CLINICAL DATA:  Chest pain EXAM: CHEST - 2 VIEW COMPARISON:  Chest x-ray dated December 07, 2020 FINDINGS: Unchanged cardiomegaly. Unchanged position of right central venous line. Small bilateral pleural effusions with atelectasis, similar to prior. New linear opacity of the right mid lung is likely related to fluid tracking along the fissure. No evidence of pneumothorax. IMPRESSION: Similar small bilateral pleural effusions and atelectasis. Electronically Signed   By: Yetta Glassman MD   On: 12/15/2020 08:37   CT HEAD WO CONTRAST (5MM)  Result Date: 12/15/2020 CLINICAL DATA:  Headache EXAM: CT HEAD WITHOUT CONTRAST TECHNIQUE: Contiguous axial images were obtained from the base of the skull through the vertex without intravenous contrast. COMPARISON:  12/07/2020  FINDINGS: Brain: No evidence of acute infarction, hemorrhage, hydrocephalus, extra-axial collection or mass lesion/mass effect. Vascular: Atherosclerotic calcifications involving the large vessels of the skull base. No unexpected hyperdense vessel. Skull: Normal. Negative for fracture or focal lesion. Sinuses/Orbits: No acute finding. Other: None. IMPRESSION: No acute intracranial findings. Electronically Signed   By: Davina Poke D.O.   On: 12/15/2020 08:29    ____________________________________________   PROCEDURES  Procedure(s) performed (including Critical Care):  Procedures   ____________________________________________   INITIAL IMPRESSION / ASSESSMENT AND PLAN / ED COURSE      Patient presents with above to history and exam for several complaints including headache, some chest tightness and persistent lower right flank pain that seems not improving on Tylenol but not similar from pain she has had over the last several weeks.  She does not think she hit her back this area when she last fell a couple days ago but does think she may have hit her head.  On arrival she is afebrile and hemodynamically stable.  With regard to her headache differential includes possible concussion, contusion of the forehead, skull fracture and SAH.  CT head is unremarkable suspect contusion or possible concussive symptoms symptoms all seem to begin after she states she fell onto her head.  Regard to her chest tightness patient states this comes and goes and has been ongoing over the last several months.  Differential includes PE, pneumothorax, pneumonia, arrhythmia, ACS, anemia, obstructive airway disease bronchitis.  Chest x-ray has similarly seen on prior imaging small bilateral pleural effusions without focal consolidation, pneumothorax, rib fracture, or other clear acute intrathoracic process.    ECG and nonelevated troponin x2 are not suggestive of ACS.  Patient has no leukocytosis fever or other  respiratory symptoms or findings on chest x-ray to suggest pneumonia.  Lower suspicion for PE as patient states this is very similar to symptoms of ongoing over the last several weeks and seem to come and go.  On her visit on 8/1 she did undergo a CTA chest as much of any evidence of PE given symptoms today seem very largely similar to how they were then the lower suspicion for development of interim emboli and will defer further CTA at this time.  Hemoglobin is baseline and not consistent with acute symptomatic anemia.  Unclear etiology of her chest pain although with regard to this I think she is stable for close outpatient PCP follow-up.  With regard to her back pain while she does endorse recent fall does not seem she hit this area where she was hurting she has no evidence of focal deficit on exam.  She does have some tenderness of right paralumbar muscles without overlying skin changes.  No evidence of cellulitis or shingles.  Seems she was hurting in this area last time she was here on 8 1 and had a CT done at that time that showed no clear acute findings but fairly large burden of stool concerning for significant constipation.  Patient states she is still constipated and has not yet started MiraLAX.  Additionally possible is contributing to her pain versus muscle spasm in this area.  No aneurysm noted on CT at that time and I have a low suspicion for for development of internal aneurysm or dissection.  She has no abdominal pain or other GI symptoms to suggest appendicitis  or diverticulitis.  CMP shows no evidence of hepatitis or cholestasis and have a low suspicion for biliary pathology.  CMP otherwise shows no significant electrolyte or metabolic derangements.  She states she makes a very small amount of urine but has not had any change in this or burning with urination and given she had very similar symptoms with CT not showing any perinephric stranding at that time and has no fever or leukocytosis today  I have a low suspicionfor pyelonephritis or stone.  Overall unclear allergy for patient's present pains although given stable vitals, reassuring labs and reassuring CT obtained 3 days ago I think patient is stable for discharge with close outpatient PCP follow-up.  Will route her Flexeril to the correct pharmacy today.  She was given below noted analgesia.  Discharged stable condition.  Strict return precautions advised and discussed.  Encouraged to take MiraLAX as directed to help with her constipation.     ____________________________________________   FINAL CLINICAL IMPRESSION(S) / ED DIAGNOSES  Final diagnoses:  Flank pain  Chest pain, unspecified type  Post-traumatic headache, not intractable, unspecified chronicity pattern  ESRD (end stage renal disease) (HCC)    Medications  lidocaine (LIDODERM) 5 % 1 patch (1 patch Transdermal Patch Applied 12/15/20 0821)  cyclobenzaprine (FLEXERIL) tablet 10 mg (10 mg Oral Given 12/15/20 0821)  acetaminophen (TYLENOL) tablet 1,000 mg (1,000 mg Oral Given 12/15/20 [id number])  oxyCODONE (Oxy IR/ROXICODONE) immediate release tablet 5 mg (5 mg Oral Given 12/15/20 1050)     ED Discharge Orders     None        Note:  This document was prepared using Dragon voice recognition software and may include unintentional dictation errors.    Lucrezia Starch, MD 12/15/20 1055

## 2020-12-17 ENCOUNTER — Other Ambulatory Visit: Payer: Self-pay

## 2020-12-17 DIAGNOSIS — Z955 Presence of coronary angioplasty implant and graft: Secondary | ICD-10-CM | POA: Diagnosis not present

## 2020-12-17 DIAGNOSIS — Z79899 Other long term (current) drug therapy: Secondary | ICD-10-CM | POA: Diagnosis not present

## 2020-12-17 DIAGNOSIS — N186 End stage renal disease: Secondary | ICD-10-CM | POA: Insufficient documentation

## 2020-12-17 DIAGNOSIS — I132 Hypertensive heart and chronic kidney disease with heart failure and with stage 5 chronic kidney disease, or end stage renal disease: Secondary | ICD-10-CM | POA: Diagnosis not present

## 2020-12-17 DIAGNOSIS — I509 Heart failure, unspecified: Secondary | ICD-10-CM | POA: Diagnosis not present

## 2020-12-17 DIAGNOSIS — Z992 Dependence on renal dialysis: Secondary | ICD-10-CM | POA: Diagnosis not present

## 2020-12-17 DIAGNOSIS — E1122 Type 2 diabetes mellitus with diabetic chronic kidney disease: Secondary | ICD-10-CM | POA: Diagnosis not present

## 2020-12-17 DIAGNOSIS — Z87891 Personal history of nicotine dependence: Secondary | ICD-10-CM | POA: Insufficient documentation

## 2020-12-17 DIAGNOSIS — M545 Low back pain, unspecified: Secondary | ICD-10-CM | POA: Diagnosis not present

## 2020-12-17 DIAGNOSIS — R109 Unspecified abdominal pain: Secondary | ICD-10-CM | POA: Diagnosis present

## 2020-12-17 NOTE — ED Triage Notes (Signed)
Pt brought in by [facility] with complaints of back (right sided) flank pain. Has been seen here for the same three days ago. Has Hx of stage 4 kidney disease

## 2020-12-18 ENCOUNTER — Emergency Department
Admission: EM | Admit: 2020-12-18 | Discharge: 2020-12-18 | Disposition: A | Discharge status: Home / Self Care | Payer: Medicaid Other | Attending: Emergency Medicine | Admitting: Emergency Medicine

## 2020-12-18 DIAGNOSIS — R109 Unspecified abdominal pain: Secondary | ICD-10-CM

## 2020-12-18 MED ORDER — OXYCODONE-ACETAMINOPHEN 5-325 MG PO TABS
1.0000 | ORAL_TABLET | Freq: Once | ORAL | Status: AC
  Administered 2020-12-18: 1 via ORAL
  Filled 2020-12-18: qty 1

## 2020-12-18 MED ORDER — OXYCODONE HCL 5 MG PO TABS: 5.0000 mg | ORAL_TABLET | Freq: Three times a day (TID) | ORAL | 0 refills | Status: AC | PRN

## 2020-12-18 NOTE — ED Notes (Signed)
EDP at bedside  

## 2020-12-18 NOTE — ED Provider Notes (Signed)
[facility]  ____________________________________________   Event Date/Time   First MD Initiated Contact with Patient 12/18/20 [phone]     (approximate)  I have reviewed the triage vital signs and the nursing notes.  HISTORY  Chief Complaint Back Pain (C/o right back pain intermittently x"several weeks". Pt. Has been seen several times in the ED for same. Denies change in pain.)    HPI Lorena Broskey is a 65 y.o. female past medical history of end-stage renal disease dialysis Tuesday, Thursday, Saturday, heart failure with preserved ejection fraction, diabetes, hypertension who is presenting with right flank/back pain.  Patient notes that the pain has been going on for several weeks.  She denies any preceding injury.  The pain is located in the right mid to lower back and does not radiate.  She has no midline pain nor has any radicular pain denies numbness or weakness in her legs.  She has no fecal incontinence, at baseline has urinary incontinence.  Denies fevers or chills.  She denies shortness of breath and has a chronic cough.  No hemoptysis.  Patient has been seen 3 times in the ED for this pain.  Her work-up has included a chest x-ray, 2 CT renal stones, CT angio to rule out pulmonary embolism, as well as a CT abdomen and pelvis.  She has also had troponins and EKGs.  None of which have found a etiology of her pain.  She is taking Flexeril and a lidocaine patch and Tylenol.  Patient tells me the pain is worsening.  She denies any change in quality or character of the pain however.       Past Medical History:  Diagnosis Date   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[place])    Hypertension    Renal disorder    Type 2 diabetes mellitus ([place])     Patient Active Problem List   Diagnosis Date Noted   Persistent headaches 06/27/2020   ATN (acute tubular necrosis) ([place])    Pulmonary hypertension, unspecified (HCC)    Chronic kidney disease (CKD), stage IV  (severe) (HCC)    Pleural effusion    Acute heart failure with preserved ejection fraction (HFpEF) ([place])    Pressure injury of skin 05/01/2020   Hyperkalemia    Volume overload 04/30/2020   Type 2 diabetes mellitus with stage 5 chronic kidney disease ([place]) 04/30/2020   Hypertension    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HCC)    Type 2 diabetes mellitus ([place])     Past Surgical History:  Procedure Laterality Date   APPENDECTOMY     DIALYSIS/PERMA CATHETER INSERTION N/A 05/16/2020   Procedure: DIALYSIS/PERMA CATHETER INSERTION;  Surgeon: Algernon Huxley, MD;  Location: [facility];  Service: Cardiovascular;  Laterality: N/A;   MANDIBLE SURGERY     RENAL BIOPSY Right    RIGHT HEART CATH N/A 05/10/2020   Procedure: RIGHT HEART CATH;  Surgeon: Nelva Bush, MD;  Location: [facility];  Service: Cardiovascular;  Laterality: N/A;   TEMPORARY DIALYSIS CATHETER N/A 05/11/2020   Procedure: TEMPORARY DIALYSIS CATHETER;  Surgeon: Algernon Huxley, MD;  Location: [facility];  Service: Cardiovascular;  Laterality: N/A;    Prior to Admission medications   Medication Sig Start Date End Date Taking? Authorizing Provider  oxyCODONE (ROXICODONE) 5 MG immediate release tablet Take 1 tablet (5 mg total) by mouth every 8 (eight) hours as needed for up to 1 day.  12/18/20 12/19/20 Yes Rada Hay, MD  carvedilol (COREG) 25 MG tablet Take 1 tablet (25 mg total) by mouth 2 (two) times daily with a meal. 07/02/20 08/01/20  Nolberto Hanlon, MD  cetirizine (ZYRTEC) 10 MG tablet Take 10 mg by mouth daily.    [provider]  doxycycline (ADOXA) 100 MG tablet Take 1 tablet (100 mg total) by mouth 2 (two) times daily for 7 days. 12/12/20 12/19/20  Vanessa Clover, MD  folic acid (FOLVITE) 1 MG tablet Take 1 mg by mouth daily.    [provider]  lidocaine (LIDODERM) 5 % Place 1 patch onto the skin every 12 (twelve) hours for 10 days. Remove & Discard patch within 12 hours or  as directed by MD 12/12/20 12/22/20  Vanessa Kiester, MD  topiramate (TOPAMAX) 50 MG tablet Take 1 tablet (50 mg total) by mouth 2 (two) times daily. 07/02/20 08/01/20  Nolberto Hanlon, MD  torsemide (DEMADEX) 10 MG tablet Take 1 tablet (10 mg total) by mouth daily. 07/03/20 08/02/20  Nolberto Hanlon, MD  traZODone (DESYREL) 50 MG tablet Take 1 tablet (50 mg total) by mouth every evening. 07/02/20 08/01/20  Nolberto Hanlon, MD  venlafaxine (EFFEXOR) 37.5 MG tablet Take 1 tablet (37.5 mg total) by mouth 2 (two) times daily with a meal. 07/02/20 08/01/20  Nolberto Hanlon, MD  verapamil (CALAN-SR) 240 MG CR tablet Take 1 tablet (240 mg total) by mouth at bedtime. 07/02/20 08/01/20  Nolberto Hanlon, MD    Allergies Penicillins  Family History  Problem Relation Age of Onset   Heart attack Mother    Asthma Mother    Uterine cancer Mother    Skin cancer Father    Prostate cancer Father    CAD Father    Parkinson's disease Father    Lung cancer Brother     Social History Social History   Tobacco Use   Smoking status: Former   Smokeless tobacco: Never  Scientific laboratory technician Use: Never used  Substance Use Topics   Alcohol use: Not Currently    Review of Systems   Review of Systems  Constitutional:  Negative for fatigue and fever.  Respiratory:  Positive for cough. Negative for shortness of breath.   Cardiovascular:  Negative for chest pain and leg swelling.  Gastrointestinal:  Positive for constipation. Negative for abdominal pain, diarrhea, nausea and vomiting.  Genitourinary:  Negative for dysuria.  Musculoskeletal:  Positive for arthralgias, back pain and myalgias.  Neurological:  Negative for weakness and numbness.  All other systems reviewed and are negative.  Physical Exam Updated Vital Signs BP (!) 188/100   Pulse (!) 101   Temp 98.5 F (36.9 C) (Oral)   Resp (!) 23   Ht '5\' 6"'$  (1.676 m)   Wt 77.1 kg   SpO2 98%   BMI 27.44 kg/m   Physical Exam Vitals and nursing note reviewed.   Constitutional:      General: She is not in acute distress.    Appearance: Normal appearance. She is not toxic-appearing.  HENT:     Head: Normocephalic and atraumatic.     Nose: Nose normal. No congestion.     Mouth/Throat:     Mouth: Mucous membranes are moist.  Eyes:     General: No scleral icterus.    Conjunctiva/sclera: Conjunctivae normal.  Cardiovascular:     Rate and Rhythm: Normal rate and regular rhythm.  Pulmonary:     Effort: Pulmonary effort is normal. No respiratory distress.  Breath sounds: Normal breath sounds. No wheezing.  Abdominal:     Palpations: Abdomen is soft.     Tenderness: There is no abdominal tenderness. There is no guarding.  Musculoskeletal:        General: Tenderness present. No swelling, deformity or signs of injury. Normal range of motion.     Cervical back: Neck supple. No rigidity.     Comments: There is tenderness to palpation over the right mid to lower back, no overlying skin changes, there is no midline T or L-spine tenderness 5 out of 5 strength in the bilateral lower extremities with plantar flexion dorsiflexion  Skin:    General: Skin is warm and dry.     Coloration: Skin is not jaundiced.  Neurological:     General: No focal deficit present.     Mental Status: She is alert and oriented to person, place, and time.  Psychiatric:        Mood and Affect: Mood normal.        Behavior: Behavior normal.     LABS (all labs ordered are listed, but only abnormal results are displayed)  Labs Reviewed - No data to display ____________________________________________  EKG  Normal sinus rhythm, nonspecific T wave inversions in 3 and aVF which have been seen before ____________________________________________  RADIOLOGY I, Madelin Headings, personally viewed and evaluated these images (plain radiographs) as part of my medical decision making, as well as reviewing the written report by the radiologist.  ED MD interpretation:  N/A    ____________________________________________   PROCEDURES  Procedure(s) performed (including Critical Care):  Procedures   ____________________________________________   INITIAL IMPRESSION / ASSESSMENT AND PLAN / ED COURSE     Patient is a 65 year old female who presents with several weeks of right flank/low back pain.  She has been seen multiple times for this prior and has had a fairly extensive work-up.  She has had a renal stone ruled out as well as any other intra-abdominal pathology that could be causing her pain with a CT abdomen and pelvis with contrast.  Additionally she has had any acute thoracic process ruled out including pulmonary embolism with a CT PE study.  Her labs have been normal as well as her EKGs and troponins.  Today the pain is not any different in quality or character as on her previous visits therefore I do not feel that additional work-up is at this time.  As a diagnosis of exclusion I feel that this is most likely musculoskeletal in etiology.  She is taking Flexeril and has a lidocaine patch on at this time.  We will avoid NSAIDs with her renal disease.  She was given a dose of oxycodone in the ED with significant improvement in her pain.  We discussed the issues with long-term opiate dependence, and we finally agreed upon a short very short course of oxycodone to bridge her to a primary care appointment.  We discussed that she may benefit from pain management or physical therapy.      ____________________________________________   FINAL CLINICAL IMPRESSION(S) / ED DIAGNOSES  Final diagnoses:  Right flank pain     ED Discharge Orders          Ordered    oxyCODONE (ROXICODONE) 5 MG immediate release tablet  Every 8 hours PRN        12/18/20 0917             Note:  This document was prepared using Dragon voice recognition software and  may include unintentional dictation errors.    Rada Hay, MD 12/18/20 [phone]

## 2020-12-18 NOTE — Discharge Instructions (Addendum)
Please schedule an appointment with your primary care provider as you may benefit from a referral to physical therapy or pain management.  You can take the oxycodone as needed for sleep.  Please do not drive after using this medication.

## 2020-12-23 ENCOUNTER — Ambulatory Visit (INDEPENDENT_AMBULATORY_CARE_PROVIDER_SITE_OTHER): Payer: Medicaid Other

## 2020-12-23 ENCOUNTER — Ambulatory Visit (INDEPENDENT_AMBULATORY_CARE_PROVIDER_SITE_OTHER): Payer: Medicaid Other | Admitting: Vascular Surgery

## 2020-12-23 ENCOUNTER — Other Ambulatory Visit: Payer: Self-pay

## 2020-12-23 VITALS — BP 138/75 | HR 100 | Resp 16 | Ht 66.0 in | Wt 183.0 lb

## 2020-12-23 DIAGNOSIS — N186 End stage renal disease: Secondary | ICD-10-CM

## 2020-12-23 DIAGNOSIS — E1122 Type 2 diabetes mellitus with diabetic chronic kidney disease: Secondary | ICD-10-CM | POA: Diagnosis not present

## 2020-12-23 DIAGNOSIS — Z992 Dependence on renal dialysis: Secondary | ICD-10-CM | POA: Diagnosis not present

## 2020-12-23 DIAGNOSIS — I10 Essential (primary) hypertension: Secondary | ICD-10-CM | POA: Diagnosis not present

## 2020-12-23 NOTE — Assessment & Plan Note (Signed)
Recommend:  At this time the patient does not have appropriate extremity access for dialysis.  She has been using PermCath now for over 8 months.  Her vein mapping today shows no adequate cephalic vein on the right with a marginal cephalic vein on the left.  Her basilic vein appears adequate bilaterally.  Her arterial circuit appears reasonably normal bilaterally.  Patient should have a left arm AV fistula created.  At the time of surgery, if her cephalic vein is found to be of adequate size for fistula creation, a brachiocephalic AV fistula will be created.  If the cephalic vein is inadequate for fistula creation, a basilic AV fistula be placed.  She knows that her brachiobasilic AV fistula would be a two-stage operation.  The risks, benefits and alternative therapies were reviewed in detail with the patient.  All questions were answered.  The patient agrees to proceed with surgery.

## 2020-12-23 NOTE — Patient Instructions (Signed)
AV Fistula Placement  Arteriovenous (AV) fistula placement is a surgical procedure to create a connection between a blood vessel that carries blood away from the heart (artery) and a blood vessel that returns blood to the heart (vein). This connection is called a fistula. It is often made in the forearm orupper arm. You may need this procedure if you are getting hemodialysis treatments for kidney disease. An AV fistula makes your vein larger and stronger over several months. This makes the vein a safe and easy spot to insert the needles that areused for hemodialysis. Tell a health care provider about: Any allergies you have. All medicines you are taking, including vitamins, herbs, eye drops, creams, and over-the-counter medicines. Any problems you or family members have had with anesthetic medicines. Any blood disorders you have. Any surgeries you have had. Any medical conditions you have or have had in the past. Whether you are pregnant or may be pregnant. What are the risks? Generally, this is a safe procedure. However, problems may occur, including: Infection. Blood clot. Reduced blood flow (stenosis). Weakening or ballooning out of the fistula (aneurysm). Bleeding. Allergic reactions to medicines. Nerve damage. Swelling near the fistula. Failure of the procedure. What happens before the procedure? Staying hydrated Follow instructions from your health care provider about hydration, which may include: Up to 2 hours before the procedure - you may continue to drink clear liquids, such as water, clear fruit juice, black coffee, and plain tea.  Eating and drinking restrictions Follow instructions from your health care provider about eating and drinking, which may include: 8 hours before the procedure - stop eating heavy meals or foods, such as meat, fried foods, or fatty foods. 6 hours before the procedure - stop eating light meals or foods, such as toast or cereal. 6 hours before the  procedure - stop drinking milk or drinks that contain milk. 2 hours before the procedure - stop drinking clear liquids. Medicines Ask your health care provider about: Changing or stopping your regular medicines. This is especially important if you are taking diabetes medicines or blood thinners. Taking medicines such as aspirin and ibuprofen. These medicines can thin your blood. Do not take these medicines unless your health care provider tells you to take them. Taking over-the-counter medicines, vitamins, herbs, and supplements. General instructions Do not use any products that contain nicotine or tobacco for at least 4 weeks before the procedure. These products include cigarettes, chewing tobacco, and vaping devices, such as e-cigarettes. If you need help quitting, ask your health care provider. Imaging tests of your arm may be done to find the best place for the fistula. Plan to have a responsible adult take you home from the hospital or clinic. Ask your health care provider: How your surgery site will be marked. What steps will be taken to help prevent infection. These steps may include: Removing hair at the surgery site. Washing skin with a germ-killing soap. Taking antibiotic medicine. What happens during the procedure? An IV will be inserted into one of your veins. You will be given one or more of the following: A medicine to help you relax (sedative). A medicine to numb the area (local anesthetic). A medicine to make you fall asleep (general anesthetic). A medicine that is injected into an area of your body to numb everything below the injection site (regional anesthetic). An incision will be made on the inner side of your arm. A vein and an artery will be opened and connected with stitches (sutures). The  incision will be closed with sutures or clips. A bandage (dressing) will be placed over the area. The procedure may vary among health care providers and hospitals. What happens  after the procedure? Your blood pressure, heart rate, breathing rate, and blood oxygen level may be monitored until you leave the hospital or clinic. Your fistula site will be checked for bleeding or swelling. You will be given pain medicine as needed. If you were given a sedative during the procedure, it can affect you for several hours. Do not drive or operate machinery until your health care provider says that it is safe. Summary Arteriovenous (AV) fistula placement is a surgical procedure to create a connection between a blood vessel that carries blood away from your heart (artery) and a blood vessel that returns blood to your heart (vein). This connection is called a fistula. Follow instructions from your health care provider about eating and drinking before the procedure. Ask your health care provider about changing or stopping your regular medicines before the procedure. This is especially important if you are taking diabetes medicines or blood thinners. Plan to have a responsible adult take you home from the hospital or clinic. This information is not intended to replace advice given to you by your health care provider. Make sure you discuss any questions you have with your healthcare provider. Document Revised: 12/09/2019 Document Reviewed: 12/09/2019 Elsevier Patient Education  [place].

## 2020-12-23 NOTE — Assessment & Plan Note (Signed)
blood pressure control important in reducing the progression of atherosclerotic disease. On appropriate oral medications.  

## 2020-12-23 NOTE — Assessment & Plan Note (Signed)
blood glucose control important in reducing the progression of atherosclerotic disease. Also, involved in wound healing. On appropriate medications.  

## 2020-12-23 NOTE — Progress Notes (Signed)
Patient ID: Katie [name], female   DOB: 09-10-1955, 65 y.o.   MRN: [id number]  Chief Complaint  Patient presents with   New Patient (Initial Visit)    Ref Holley Raring ESRD    HPI Katie [name] is a 65 y.o. female.  I am asked to see the patient by Dr. Holley Raring for evaluation of permanent dialysis access.  We placed a jugular PermCath on her at the beginning of the year.  She gets dialysis on Tuesdays, Thursdays, and Saturdays.  She is right hand dominant in terms of writing but is basically ambidextrous.  Her catheter is working well and she has been using it for over 8 months.  She has no fevers or chills.  Dialysis does make her fatigued, but she has no specific complaints today. Her vein mapping today shows no adequate cephalic vein on the right with a marginal cephalic vein on the left.  Her basilic vein appears adequate bilaterally.  Her arterial circuit appears reasonably normal bilaterally.     Past Medical History:  Diagnosis Date   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[place])    Hypertension    Renal disorder    Type 2 diabetes mellitus ([place])     Past Surgical History:  Procedure Laterality Date   APPENDECTOMY     DIALYSIS/PERMA CATHETER INSERTION N/A 05/16/2020   Procedure: DIALYSIS/PERMA CATHETER INSERTION;  Surgeon: Algernon Huxley, MD;  Location: [facility];  Service: Cardiovascular;  Laterality: N/A;   MANDIBLE SURGERY     RENAL BIOPSY Right    RIGHT HEART CATH N/A 05/10/2020   Procedure: RIGHT HEART CATH;  Surgeon: Nelva Bush, MD;  Location: [facility];  Service: Cardiovascular;  Laterality: N/A;   TEMPORARY DIALYSIS CATHETER N/A 05/11/2020   Procedure: TEMPORARY DIALYSIS CATHETER;  Surgeon: Algernon Huxley, MD;  Location: [facility];  Service: Cardiovascular;  Laterality: N/A;     Family History  Problem Relation Age of Onset   Heart attack Mother    Asthma Mother    Uterine cancer Mother    Skin cancer Father    Prostate cancer  Father    CAD Father    Parkinson's disease Father    Lung cancer Brother       Social History   Tobacco Use   Smoking status: Former   Smokeless tobacco: Never  Scientific laboratory technician Use: Never used  Substance Use Topics   Alcohol use: Not Currently     Allergies  Allergen Reactions   Penicillins Nausea And Vomiting    Pt states "only pills" make her vomit    Current Outpatient Medications  Medication Sig Dispense Refill   cetirizine (ZYRTEC) 10 MG tablet Take 10 mg by mouth daily.     folic acid (FOLVITE) 1 MG tablet Take 1 mg by mouth daily.     topiramate (TOPAMAX) 25 MG tablet Take 50 mg by mouth 2 (two) times daily.     carvedilol (COREG) 25 MG tablet Take 1 tablet (25 mg total) by mouth 2 (two) times daily with a meal. 60 tablet 0   topiramate (TOPAMAX) 50 MG tablet Take 1 tablet (50 mg total) by mouth 2 (two) times daily. 60 tablet 0   torsemide (DEMADEX) 10 MG tablet Take 1 tablet (10 mg total) by mouth daily. 30 tablet 0   traZODone (DESYREL) 50 MG tablet Take 1 tablet (50 mg total) by mouth every evening. 30 tablet 0  venlafaxine (EFFEXOR) 37.5 MG tablet Take 1 tablet (37.5 mg total) by mouth 2 (two) times daily with a meal. 60 tablet 0   verapamil (CALAN-SR) 240 MG CR tablet Take 1 tablet (240 mg total) by mouth at bedtime. 30 tablet 0   No current facility-administered medications for this visit.      REVIEW OF SYSTEMS (Negative unless checked)  Constitutional: '[]'$ Weight loss  '[]'$ Fever  '[]'$ Chills Cardiac: '[]'$ Chest pain   '[]'$ Chest pressure   '[]'$ Palpitations   '[]'$ Shortness of breath when laying flat   '[]'$ Shortness of breath at rest   '[x]'$ Shortness of breath with exertion. Vascular:  '[]'$ Pain in legs with walking   '[]'$ Pain in legs at rest   '[]'$ Pain in legs when laying flat   '[]'$ Claudication   '[]'$ Pain in feet when walking  '[]'$ Pain in feet at rest  '[]'$ Pain in feet when laying flat   '[]'$ History of DVT   '[]'$ Phlebitis   '[]'$ Swelling in legs   '[]'$ Varicose veins   '[]'$ Non-healing  ulcers Pulmonary:   '[]'$ Uses home oxygen   '[]'$ Productive cough   '[]'$ Hemoptysis   '[]'$ Wheeze  '[]'$ COPD   '[]'$ Asthma Neurologic:  '[]'$ Dizziness  '[]'$ Blackouts   '[]'$ Seizures   '[]'$ History of stroke   '[]'$ History of TIA  '[]'$ Aphasia   '[]'$ Temporary blindness   '[]'$ Dysphagia   '[]'$ Weakness or numbness in arms   '[]'$ Weakness or numbness in legs Musculoskeletal:  '[x]'$ Arthritis   '[]'$ Joint swelling   '[]'$ Joint pain   '[]'$ Low back pain Hematologic:  '[]'$ Easy bruising  '[]'$ Easy bleeding   '[]'$ Hypercoagulable state   '[x]'$ Anemic  '[]'$ Hepatitis Gastrointestinal:  '[]'$ Blood in stool   '[]'$ Vomiting blood  '[]'$ Gastroesophageal reflux/heartburn   '[]'$ Abdominal pain Genitourinary:  '[x]'$ Chronic kidney disease   '[]'$ Difficult urination  '[]'$ Frequent urination  '[]'$ Burning with urination   '[]'$ Hematuria Skin:  '[]'$ Rashes   '[]'$ Ulcers   '[]'$ Wounds Psychological:  '[]'$ History of anxiety   '[]'$  History of major depression.    Physical Exam BP 138/75 (BP Location: Right Arm)   Pulse 100   Resp 16   Ht '5\' 6"'$  (1.676 m)   Wt 183 lb (83 kg)   BMI 29.54 kg/m  Gen:  WD/WN, NAD Head: [place]/AT, No temporalis wasting.  Ear/Nose/Throat: Hearing grossly intact, nares w/o erythema or drainage, oropharynx w/o Erythema/Exudate Eyes: Conjunctiva clear, sclera non-icteric  Neck: trachea midline.  No JVD.  Pulmonary:  Good air movement, respirations not labored, no use of accessory muscles  Cardiac: RRR, no JVD Vascular: PermCath is in place in the right chest without erythema or drainage. Vessel Right Left  Radial Palpable Palpable                                   Gastrointestinal:. No masses, surgical incisions, or scars. Musculoskeletal: M/S 5/5 throughout.  Extremities without ischemic changes.  No deformity or atrophy.  Walks with a walker.  Mild bilateral lower extremity edema. Neurologic: Sensation grossly intact in extremities.  Symmetrical.  Speech is fluent. Motor exam as listed above. Psychiatric: Judgment intact, Mood & affect appropriate for pt's clinical  situation. Dermatologic: No rashes or ulcers noted.  No cellulitis or open wounds.    Radiology DG Chest 2 View  Result Date: 12/15/2020 CLINICAL DATA:  Chest pain EXAM: CHEST - 2 VIEW COMPARISON:  Chest x-ray dated December 07, 2020 FINDINGS: Unchanged cardiomegaly. Unchanged position of right central venous line. Small bilateral pleural effusions with atelectasis, similar to prior. New linear opacity of the right mid lung is likely related  to fluid tracking along the fissure. No evidence of pneumothorax. IMPRESSION: Similar small bilateral pleural effusions and atelectasis. Electronically Signed   By: Yetta Glassman MD   On: 12/15/2020 08:37   DG Chest 2 View  Result Date: 12/07/2020 CLINICAL DATA:  Chest pain, renal failure EXAM: CHEST - 2 VIEW COMPARISON:  11/25/2020 FINDINGS: Cardiomegaly. Large-bore right neck multi lumen vascular catheter. Small bilateral pleural effusions. Disc degenerative disease thoracic spine. IMPRESSION: Cardiomegaly. Small bilateral pleural effusions, unchanged compared to prior. Electronically Signed   By: Eddie Candle M.D.   On: 12/07/2020 15:58   DG Chest 2 View  Result Date: 11/25/2020 CLINICAL DATA:  Chest pain, short of breath, hypertension EXAM: CHEST - 2 VIEW COMPARISON:  06/29/2020 FINDINGS: Frontal and lateral views of the chest demonstrate stable right internal jugular dialysis catheter. The cardiac silhouette is stable. There are small bilateral pleural effusions, right greater than left, decreased since prior study. Patchy consolidation at the lung bases likely reflects atelectasis or scarring. No acute airspace disease or pneumothorax. No acute bony abnormalities. IMPRESSION: 1. Persistent but decreased bilateral pleural effusions, with associated bibasilar scarring or atelectasis. 2. No acute airspace disease. Electronically Signed   By: Randa Ngo M.D.   On: 11/25/2020 20:02   CT HEAD WO CONTRAST (5MM)  Result Date: 12/15/2020 CLINICAL DATA:   Headache EXAM: CT HEAD WITHOUT CONTRAST TECHNIQUE: Contiguous axial images were obtained from the base of the skull through the vertex without intravenous contrast. COMPARISON:  12/07/2020 FINDINGS: Brain: No evidence of acute infarction, hemorrhage, hydrocephalus, extra-axial collection or mass lesion/mass effect. Vascular: Atherosclerotic calcifications involving the large vessels of the skull base. No unexpected hyperdense vessel. Skull: Normal. Negative for fracture or focal lesion. Sinuses/Orbits: No acute finding. Other: None. IMPRESSION: No acute intracranial findings. Electronically Signed   By: Davina Poke D.O.   On: 12/15/2020 08:29   CT Head Wo Contrast  Result Date: 12/07/2020 CLINICAL DATA:  Status post fall with a blow to the left side of the head. Initial encounter. EXAM: CT HEAD WITHOUT CONTRAST TECHNIQUE: Contiguous axial images were obtained from the base of the skull through the vertex without intravenous contrast. COMPARISON:  None. FINDINGS: Brain: No evidence of acute infarction, hemorrhage, hydrocephalus, extra-axial collection or mass lesion/mass effect. Vascular: No hyperdense vessel or unexpected calcification. Skull: Normal. Negative for fracture or focal lesion. Sinuses/Orbits: Negative. Other: None. IMPRESSION: Negative head CT. Electronically Signed   By: Inge Rise M.D.   On: 12/07/2020 15:05   CT Angio Chest PE W and/or Wo Contrast  Result Date: 12/12/2020 CLINICAL DATA:  PE suspected, back and flank pain, shortness of breath, dialysis EXAM: CT ANGIOGRAPHY CHEST WITH CONTRAST TECHNIQUE: Multidetector CT imaging of the chest was performed using the standard protocol during bolus administration of intravenous contrast. Multiplanar CT image reconstructions and MIPs were obtained to evaluate the vascular anatomy. CONTRAST:  60m OMNIPAQUE IOHEXOL 350 MG/ML SOLN COMPARISON:  None. FINDINGS: Cardiovascular: Right chest large bore multi lumen vascular catheter. Examination  for pulmonary embolism is limited by marginal contrast bolus, main pulmonary artery HU = 172). Within this limitation, no evidence of pulmonary embolism through the proximal segmental pulmonary arterial level. Normal heart size. Three-vessel coronary artery calcifications. No pericardial effusion. Aortic atherosclerosis. Mediastinum/Nodes: No enlarged mediastinal, hilar, or axillary lymph nodes. Thyroid gland, trachea, and esophagus demonstrate no significant findings. Lungs/Pleura: Small bilateral pleural effusions, with extensive bronchial plugging and associated atelectasis or consolidation of the left lower lobe (series 5, image 223). Upper Abdomen: Please see  separately reported examination of the abdomen and pelvis. Musculoskeletal: No chest wall abnormality. No acute or significant osseous findings. Review of the MIP images confirms the above findings. IMPRESSION: 1. Examination for pulmonary embolism is limited by marginal contrast bolus. Within this limitation, no evidence of pulmonary embolism through the proximal segmental pulmonary arterial level. 2. Small bilateral pleural effusions, with extensive bronchial plugging and associated atelectasis or consolidation of the left lower lobe. Findings are concerning for infection or aspiration. 3. Coronary artery disease. Aortic Atherosclerosis (ICD10-I70.0). Electronically Signed   By: Eddie Candle M.D.   On: 12/12/2020 16:51   CT ABDOMEN PELVIS W CONTRAST  Result Date: 12/12/2020 CLINICAL DATA:  Right-sided flank pain, history of kidney stones, dialysis EXAM: CT ABDOMEN AND PELVIS WITH CONTRAST TECHNIQUE: Multidetector CT imaging of the abdomen and pelvis was performed using the standard protocol following bolus administration of intravenous contrast. CONTRAST:  44m OMNIPAQUE IOHEXOL 350 MG/ML SOLN COMPARISON:  CT abdomen pelvis, 12/07/2020 FINDINGS: Lower chest: Please see separately reported examination of the chest. Hepatobiliary: No solid liver  abnormality is seen. No gallstones, gallbladder wall thickening, or biliary dilatation. Pancreas: Unremarkable. No pancreatic ductal dilatation or surrounding inflammatory changes. Spleen: Normal in size without significant abnormality. Adrenals/Urinary Tract: Adrenal glands are unremarkable. Renal vascular calcifications are present without convincing renal calculi. No ureteral calculi or hydronephrosis. Bladder is unremarkable. Stomach/Bowel: Stomach is within normal limits. Appendix is not clearly visualized and may be surgically absent. No evidence of bowel wall thickening, distention, or inflammatory changes. Large burden of stool throughout the colon and rectum. Sigmoid diverticulosis. Vascular/Lymphatic: Aortic atherosclerosis. No enlarged abdominal or pelvic lymph nodes. Reproductive: No mass or other significant abnormality. Other: No abdominal wall hernia or abnormality. No abdominopelvic ascites. Musculoskeletal: No acute or significant osseous findings. Unchanged skin thickening and soft tissue stranding overlying the coccyx (series 2, image 81). IMPRESSION: 1. No acute CT findings of the abdomen or pelvis to explain right-sided flank pain. No evidence of urinary tract calculus or hydronephrosis. 2. Large burden of stool throughout the colon and rectum. 3. Sigmoid diverticulosis without evidence of acute diverticulitis. 4. Unchanged skin thickening and soft tissue stranding overlying the coccyx. Correlate for evidence of decubitus ulceration. Aortic Atherosclerosis (ICD10-I70.0). Electronically Signed   By: AEddie CandleM.D.   On: 12/12/2020 16:55   CT Renal Stone Study  Result Date: 12/07/2020 CLINICAL DATA:  Flank pain, kidney stone suspected. Chronic kidney disease EXAM: CT ABDOMEN AND PELVIS WITHOUT CONTRAST TECHNIQUE: Multidetector CT imaging of the abdomen and pelvis was performed following the standard protocol without IV contrast. COMPARISON:  11/25/2020 FINDINGS: Lower chest: Small bilateral  pleural effusions with left lower lobe atelectasis. Heart size is upper limits of normal. Small pericardial effusion. Hepatobiliary: Unremarkable unenhanced appearance of the liver. No focal liver lesion identified. Gallbladder within normal limits. Suspect a small amount of layering biliary sludge. No hyperdense gallstone. No biliary dilatation. Pancreas: Unremarkable. No pancreatic ductal dilatation or surrounding inflammatory changes. Spleen: Normal in size without focal abnormality. Adrenals/Urinary Tract: Unremarkable adrenal glands. Stable size and appearance of the kidneys. Bilateral renal vascular calcifications. No evidence of nephrolithiasis. No hydronephrosis. Bilateral ureters are unremarkable. Urinary bladder is within normal limits for the degree of distension. Stomach/Bowel: Stomach is within normal limits. Advanced sigmoid diverticulosis. No evidence of bowel wall thickening, distention, or inflammatory changes. Moderate volume of stool throughout the colon. Vascular/Lymphatic: Diffuse aortoiliac atherosclerotic calcifications without aneurysm. No abdominopelvic lymphadenopathy. Reproductive: Uterus and bilateral adnexa are unremarkable. Other: No free fluid. No abdominopelvic fluid  collection. No pneumoperitoneum. No abdominal wall hernia. Mild anasarca. Musculoskeletal: No acute osseous abnormality. Typical appearance of a L2 intraosseous hemangioma. Skin thickening in the midline overlying the distal sacrum and coccyx without deep ulceration, soft tissue gas, or associated fluid collection. No evidence of new bone destruction. IMPRESSION: 1. No evidence of obstructive uropathy. 2. Small bilateral pleural effusions with left lower lobe atelectasis. 3. Small pericardial effusion. 4. Advanced sigmoid diverticulosis without evidence of acute diverticulitis. 5. Moderate volume of stool throughout the colon. 6. Skin thickening in the midline overlying the distal sacrum and coccyx without deep  ulceration, soft tissue gas, or associated fluid collection. Aortic Atherosclerosis (ICD10-I70.0). Electronically Signed   By: Davina Poke D.O.   On: 12/07/2020 15:12   CT Renal Stone Study  Result Date: 11/25/2020 CLINICAL DATA:  Right flank pain EXAM: CT ABDOMEN AND PELVIS WITHOUT CONTRAST TECHNIQUE: Multidetector CT imaging of the abdomen and pelvis was performed following the standard protocol without IV contrast. COMPARISON:  None. FINDINGS: Lower chest: Small bilateral pleural effusions are present. There is compressive atelectasis of the visualized left lower lobe. Global cardiac size is within normal limits. Trace pericardial effusion. Hepatobiliary: Cholelithiasis without pericholecystic inflammatory change. Liver unremarkable. No intra or extrahepatic biliary ductal dilation. Pancreas: Unremarkable Spleen: Unremarkable Adrenals/Urinary Tract: The adrenal glands are unremarkable. The kidneys are normal in size and position. Probable vascular calcifications within the renal hila bilaterally. No definite calculi within the renal collecting system bilaterally. No hydronephrosis. No perinephric fluid collections. The bladder is unremarkable. Stomach/Bowel: There is severe sigmoid colonic diverticulosis. No superimposed acute inflammatory changes are identified, however. The stomach, small bowel, and large bowel are otherwise unremarkable. Appendix absent. No free intraperitoneal gas or fluid. Vascular/Lymphatic: Moderate aortoiliac atherosclerotic calcification. No aortic aneurysm. No pathologic adenopathy within the abdomen and pelvis. Reproductive: Uterus and bilateral adnexa are unremarkable. Other: Mild diffuse subcutaneous body wall edema. No abdominal wall hernia. The rectum is unremarkable. Sacral decubitus ulcer noted extending to the coccyx. No discrete drainable fluid collection identified. Musculoskeletal: No acute bone abnormality. No lytic or blastic bone lesion. Intra osseous hemangioma  noted within the L2 vertebral body. IMPRESSION: No acute intra-abdominal pathology identified. No definite radiographic explanation for the patient's reported symptoms. Cholelithiasis. Severe sigmoid colonic diverticulosis. Mild anasarca with small bilateral pleural effusions and diffuse body wall subcutaneous edema. Sacral decubitus ulcer with extension to the a coccyx. No discrete drainable fluid collection. Aortic Atherosclerosis (ICD10-I70.0). Electronically Signed   By: Fidela Salisbury MD   On: 11/25/2020 22:43    Labs Recent Results (from the past 2160 hour(s))  CBC     Status: Abnormal   Collection Time: 11/25/20  7:45 PM  Result Value Ref Range   WBC 7.8 4.0 - 10.5 K/uL   RBC 3.28 (L) 3.87 - 5.11 MIL/uL   Hemoglobin 10.4 (L) 12.0 - 15.0 g/dL   HCT 33.3 (L) 36.0 - 46.0 %   MCV 101.5 (H) 80.0 - 100.0 fL   MCH 31.7 26.0 - 34.0 pg   MCHC 31.2 30.0 - 36.0 g/dL   RDW 13.2 11.5 - 15.5 %   Platelets 331 150 - 400 K/uL   nRBC 0.0 0.0 - 0.2 %    Comment: Performed at [facility], [address] [place], [place] [address]  Troponin I (High Sensitivity)     Status: None   Collection Time: 11/25/20  7:45 PM  Result Value Ref Range   Troponin I (High Sensitivity) 16 <18 ng/L    Comment: (NOTE)  Elevated high sensitivity troponin I (hsTnI) values and significant  changes across serial measurements may suggest ACS but many other  chronic and acute conditions are known to elevate hsTnI results.  Refer to the "Links" section for chest pain algorithms and additional  guidance. Performed at [facility], [address] [place], [place] [address]   Comprehensive metabolic panel     Status: Abnormal   Collection Time: 11/25/20  7:45 PM  Result Value Ref Range   Sodium 141 135 - 145 mmol/L   Potassium 4.7 3.5 - 5.1 mmol/L   Chloride 104 98 - 111 mmol/L   CO2 25 22 - 32 mmol/L   Glucose, Bld 168 (H) 70 - 99 mg/dL    Comment: Glucose reference range applies only to samples  taken after fasting for at least 8 hours.   BUN 47 (H) 8 - 23 mg/dL   Creatinine, Ser 4.41 (H) 0.44 - 1.00 mg/dL   Calcium 8.5 (L) 8.9 - 10.3 mg/dL   Total Protein 6.7 6.5 - 8.1 g/dL   Albumin 3.4 (L) 3.5 - 5.0 g/dL   AST 19 15 - 41 U/L   ALT 17 0 - 44 U/L   Alkaline Phosphatase 83 38 - 126 U/L   Total Bilirubin 0.5 0.3 - 1.2 mg/dL   GFR, Estimated 11 (L) >60 mL/min    Comment: (NOTE) Calculated using the CKD-EPI Creatinine Equation (2021)    Anion gap 12 5 - 15    Comment: Performed at [facility], [address], [place], [place] [address]  Lactic acid, plasma     Status: None   Collection Time: 11/25/20  8:57 PM  Result Value Ref Range   Lactic Acid, Venous 1.2 0.5 - 1.9 mmol/L    Comment: Performed at [facility], [place], [name] [address]  Troponin I (High Sensitivity)     Status: None   Collection Time: 11/25/20  8:57 PM  Result Value Ref Range   Troponin I (High Sensitivity) 16 <18 ng/L    Comment: (NOTE) Elevated high sensitivity troponin I (hsTnI) values and significant  changes across serial measurements may suggest ACS but many other  chronic and acute conditions are known to elevate hsTnI results.  Refer to the "Links" section for chest pain algorithms and additional  guidance. Performed at [facility], [place], [place] [address]   Lactic acid, plasma     Status: None   Collection Time: 11/25/20  9:23 PM  Result Value Ref Range   Lactic Acid, Venous 1.3 0.5 - 1.9 mmol/L    Comment: Performed at [facility], [address] [place], [place] [address]  CBC with Differential     Status: Abnormal   Collection Time: 12/07/20  2:41 PM  Result Value Ref Range   WBC 6.4 4.0 - 10.5 K/uL   RBC 3.43 (L) 3.87 - 5.11 MIL/uL   Hemoglobin 10.9 (L) 12.0 - 15.0 g/dL   HCT 34.1 (L) 36.0 - 46.0 %   MCV 99.4 80.0 - 100.0 fL   MCH 31.8 26.0 - 34.0 pg   MCHC 32.0 30.0 - 36.0 g/dL   RDW 13.3 11.5 - 15.5  %   Platelets 331 150 - 400 K/uL   nRBC 0.0 0.0 - 0.2 %   Neutrophils Relative % 62 %   Neutro Abs 3.9 1.7 - 7.7 K/uL   Lymphocytes Relative 22 %   Lymphs Abs 1.4 0.7 - 4.0 K/uL  Monocytes Relative 13 %   Monocytes Absolute 0.8 0.1 - 1.0 K/uL   Eosinophils Relative 2 %   Eosinophils Absolute 0.1 0.0 - 0.5 K/uL   Basophils Relative 1 %   Basophils Absolute 0.1 0.0 - 0.1 K/uL   Immature Granulocytes 0 %   Abs Immature Granulocytes 0.01 0.00 - 0.07 K/uL    Comment: Performed at [facility], [address], [place], [place] [address]  Comprehensive metabolic panel     Status: Abnormal   Collection Time: 12/07/20  2:41 PM  Result Value Ref Range   Sodium 139 135 - 145 mmol/L   Potassium 4.6 3.5 - 5.1 mmol/L   Chloride 100 98 - 111 mmol/L   CO2 27 22 - 32 mmol/L   Glucose, Bld 106 (H) 70 - 99 mg/dL    Comment: Glucose reference range applies only to samples taken after fasting for at least 8 hours.   BUN 45 (H) 8 - 23 mg/dL   Creatinine, Ser 4.70 (H) 0.44 - 1.00 mg/dL   Calcium 8.7 (L) 8.9 - 10.3 mg/dL   Total Protein 6.9 6.5 - 8.1 g/dL   Albumin 3.3 (L) 3.5 - 5.0 g/dL   AST 15 15 - 41 U/L   ALT 15 0 - 44 U/L   Alkaline Phosphatase 82 38 - 126 U/L   Total Bilirubin 0.7 0.3 - 1.2 mg/dL   GFR, Estimated 10 (L) >60 mL/min    Comment: (NOTE) Calculated using the CKD-EPI Creatinine Equation (2021)    Anion gap 12 5 - 15    Comment: Performed at [facility], [address] [place], [place] [address]  Lipase, blood     Status: None   Collection Time: 12/07/20  2:41 PM  Result Value Ref Range   Lipase 47 11 - 51 U/L    Comment: Performed at [facility], [place], [name] [address]  Troponin I (High Sensitivity)     Status: None   Collection Time: 12/07/20  2:41 PM  Result Value Ref Range   Troponin I (High Sensitivity) 14 <18 ng/L    Comment: (NOTE) Elevated high sensitivity troponin I (hsTnI) values and significant  changes  across serial measurements may suggest ACS but many other  chronic and acute conditions are known to elevate hsTnI results.  Refer to the "Links" section for chest pain algorithms and additional  guidance. Performed at [facility], [place], [place] [address]   Resp Panel by RT-PCR (Flu A&B, Covid) Nasopharyngeal Swab     Status: None   Collection Time: 12/07/20  2:41 PM   Specimen: Nasopharyngeal Swab; Nasopharyngeal(NP) swabs in vial transport medium  Result Value Ref Range   SARS Coronavirus 2 by RT PCR NEGATIVE NEGATIVE    Comment: (NOTE) SARS-CoV-2 target nucleic acids are NOT DETECTED.  The SARS-CoV-2 RNA is generally detectable in upper respiratory specimens during the acute phase of infection. The lowest concentration of SARS-CoV-2 viral copies this assay can detect is 138 copies/mL. A negative result does not preclude SARS-Cov-2 infection and should not be used as the sole basis for treatment or other patient management decisions. A negative result may occur with  improper specimen collection/handling, submission of specimen other than nasopharyngeal swab, presence of viral mutation(s) within the areas targeted by this assay, and inadequate number of viral copies(<138 copies/mL). A negative result must be combined with clinical observations, patient history, and epidemiological information. The expected result is Negative.  Fact  Sheet for Patients:  EntrepreneurPulse.com.au  Fact Sheet for Healthcare Providers:  IncredibleEmployment.be  This test is no t yet approved or cleared by the [place] FDA and  has been authorized for detection and/or diagnosis of SARS-CoV-2 by FDA under an Emergency Use Authorization (EUA). This EUA will remain  in effect (meaning this test can be used) for the duration of the COVID-19 declaration under Section 564(b)(1) of the Act, 21 U.S.C.section 360bbb-3(b)(1), unless the  authorization is terminated  or revoked sooner.       Influenza A by PCR NEGATIVE NEGATIVE   Influenza B by PCR NEGATIVE NEGATIVE    Comment: (NOTE) The Xpert Xpress SARS-CoV-2/FLU/RSV plus assay is intended as an aid in the diagnosis of influenza from Nasopharyngeal swab specimens and should not be used as a sole basis for treatment. Nasal washings and aspirates are unacceptable for Xpert Xpress SARS-CoV-2/FLU/RSV testing.  Fact Sheet for Patients: EntrepreneurPulse.com.au  Fact Sheet for Healthcare Providers: IncredibleEmployment.be  This test is not yet approved or cleared by the [place] FDA and has been authorized for detection and/or diagnosis of SARS-CoV-2 by FDA under an Emergency Use Authorization (EUA). This EUA will remain in effect (meaning this test can be used) for the duration of the COVID-19 declaration under Section 564(b)(1) of the Act, 21 U.S.C. section 360bbb-3(b)(1), unless the authorization is terminated or revoked.  Performed at [facility], [address] [place], [place] [id number]   Basic metabolic panel     Status: Abnormal   Collection Time: 12/12/20 11:41 AM  Result Value Ref Range   Sodium 139 135 - 145 mmol/L   Potassium 5.1 3.5 - 5.1 mmol/L   Chloride 100 98 - 111 mmol/L   CO2 23 22 - 32 mmol/L   Glucose, Bld 99 70 - 99 mg/dL    Comment: Glucose reference range applies only to samples taken after fasting for at least 8 hours.   BUN 81 (H) 8 - 23 mg/dL   Creatinine, Ser 6.79 (H) 0.44 - 1.00 mg/dL   Calcium 8.8 (L) 8.9 - 10.3 mg/dL   GFR, Estimated 6 (L) >60 mL/min    Comment: (NOTE) Calculated using the CKD-EPI Creatinine Equation (2021)    Anion gap 16 (H) 5 - 15    Comment: Performed at [facility], [address] [place], [place] [address]  CBC     Status: Abnormal   Collection Time: 12/12/20 11:41 AM  Result Value Ref Range   WBC 8.0 4.0 - 10.5 K/uL   RBC 3.59 (L)  3.87 - 5.11 MIL/uL   Hemoglobin 11.6 (L) 12.0 - 15.0 g/dL   HCT 35.2 (L) 36.0 - 46.0 %   MCV 98.1 80.0 - 100.0 fL   MCH 32.3 26.0 - 34.0 pg   MCHC 33.0 30.0 - 36.0 g/dL   RDW 12.9 11.5 - 15.5 %   Platelets 324 150 - 400 K/uL   nRBC 0.0 0.0 - 0.2 %    Comment: Performed at [facility], [address] [place],  [address]  Hepatic function panel     Status: None   Collection Time: 12/12/20 11:41 AM  Result Value Ref Range   Total Protein 7.1 6.5 - 8.1 g/dL   Albumin 3.6 3.5 - 5.0 g/dL   AST 20 15 - 41 U/L   ALT 16 0 - 44 U/L   Alkaline Phosphatase 112 38 - 126 U/L   Total Bilirubin 0.6 0.3 - 1.2 mg/dL   Bilirubin,  Direct <0.1 0.0 - 0.2 mg/dL   Indirect Bilirubin NOT CALCULATED 0.3 - 0.9 mg/dL    Comment: Performed at [facility], [address] [place], [place] [address]  Lipase, blood     Status: None   Collection Time: 12/12/20 11:41 AM  Result Value Ref Range   Lipase 44 11 - 51 U/L    Comment: Performed at [facility], [place], [place] [address]  Troponin I (High Sensitivity)     Status: Abnormal   Collection Time: 12/12/20 11:41 AM  Result Value Ref Range   Troponin I (High Sensitivity) 22 (H) <18 ng/L    Comment: (NOTE) Elevated high sensitivity troponin I (hsTnI) values and significant  changes across serial measurements may suggest ACS but many other  chronic and acute conditions are known to elevate hsTnI results.  Refer to the "Links" section for chest pain algorithms and additional  guidance. Performed at [facility], [address] [place], [place] [address]   CK     Status: None   Collection Time: 12/12/20 11:41 AM  Result Value Ref Range   Total CK 160 38 - 234 U/L    Comment: Performed at [facility], [address] [place], [place] [address]  Magnesium     Status: None   Collection Time: 12/12/20 11:41 AM  Result Value Ref Range   Magnesium 2.4 1.7 - 2.4 mg/dL    Comment:  Performed at [facility], [address] [place], [place] [address]  D-dimer, quantitative     Status: Abnormal   Collection Time: 12/12/20  2:48 PM  Result Value Ref Range   D-Dimer, Quant 5.57 (H) 0.00 - 0.50 ug/mL-FEU    Comment: (NOTE) At the manufacturer cut-off value of 0.5 g/mL FEU, this assay has a negative predictive value of 95-100%.This assay is intended for use in conjunction with a clinical pretest probability (PTP) assessment model to exclude pulmonary embolism (PE) and deep venous thrombosis (DVT) in outpatients suspected of PE or DVT. Results should be correlated with clinical presentation. Performed at [facility], [place], [place] [address]   Troponin I (High Sensitivity)     Status: Abnormal   Collection Time: 12/12/20  2:48 PM  Result Value Ref Range   Troponin I (High Sensitivity) 24 (H) <18 ng/L    Comment: (NOTE) Elevated high sensitivity troponin I (hsTnI) values and significant  changes across serial measurements may suggest ACS but many other  chronic and acute conditions are known to elevate hsTnI results.  Refer to the "Links" section for chest pain algorithms and additional  guidance. Performed at [facility], [address] [place], [place] [address]   Resp Panel by RT-PCR (Flu A&B, Covid) Nasopharyngeal Swab     Status: None   Collection Time: 12/12/20  5:58 PM   Specimen: Nasopharyngeal Swab; Nasopharyngeal(NP) swabs in vial transport medium  Result Value Ref Range   SARS Coronavirus 2 by RT PCR NEGATIVE NEGATIVE    Comment: (NOTE) SARS-CoV-2 target nucleic acids are NOT DETECTED.  The SARS-CoV-2 RNA is generally detectable in upper respiratory specimens during the acute phase of infection. The lowest concentration of SARS-CoV-2 viral copies this assay can detect is 138 copies/mL. A negative result does not preclude SARS-Cov-2 infection and should not be used as the sole basis for treatment  or other patient management decisions. A negative result may occur with  improper specimen collection/handling, submission of specimen other than  nasopharyngeal swab, presence of viral mutation(s) within the areas targeted by this assay, and inadequate number of viral copies(<138 copies/mL). A negative result must be combined with clinical observations, patient history, and epidemiological information. The expected result is Negative.  Fact Sheet for Patients:  EntrepreneurPulse.com.au  Fact Sheet for Healthcare Providers:  IncredibleEmployment.be  This test is no t yet approved or cleared by the [place] FDA and  has been authorized for detection and/or diagnosis of SARS-CoV-2 by FDA under an Emergency Use Authorization (EUA). This EUA will remain  in effect (meaning this test can be used) for the duration of the COVID-19 declaration under Section 564(b)(1) of the Act, 21 U.S.C.section 360bbb-3(b)(1), unless the authorization is terminated  or revoked sooner.       Influenza A by PCR NEGATIVE NEGATIVE   Influenza B by PCR NEGATIVE NEGATIVE    Comment: (NOTE) The Xpert Xpress SARS-CoV-2/FLU/RSV plus assay is intended as an aid in the diagnosis of influenza from Nasopharyngeal swab specimens and should not be used as a sole basis for treatment. Nasal washings and aspirates are unacceptable for Xpert Xpress SARS-CoV-2/FLU/RSV testing.  Fact Sheet for Patients: EntrepreneurPulse.com.au  Fact Sheet for Healthcare Providers: IncredibleEmployment.be  This test is not yet approved or cleared by the [place] FDA and has been authorized for detection and/or diagnosis of SARS-CoV-2 by FDA under an Emergency Use Authorization (EUA). This EUA will remain in effect (meaning this test can be used) for the duration of the COVID-19 declaration under Section 564(b)(1) of the Act, 21 U.S.C. section  360bbb-3(b)(1), unless the authorization is terminated or revoked.  Performed at [facility], [address] [place], [place] [address]   CBC     Status: Abnormal   Collection Time: 12/14/20 11:01 PM  Result Value Ref Range   WBC 8.6 4.0 - 10.5 K/uL   RBC 3.56 (L) 3.87 - 5.11 MIL/uL   Hemoglobin 11.3 (L) 12.0 - 15.0 g/dL   HCT 35.1 (L) 36.0 - 46.0 %   MCV 98.6 80.0 - 100.0 fL   MCH 31.7 26.0 - 34.0 pg   MCHC 32.2 30.0 - 36.0 g/dL   RDW 13.0 11.5 - 15.5 %   Platelets 345 150 - 400 K/uL   nRBC 0.0 0.0 - 0.2 %    Comment: Performed at [facility], [address] [place], [place] [address]  Comprehensive metabolic panel     Status: Abnormal   Collection Time: 12/14/20 11:01 PM  Result Value Ref Range   Sodium 137 135 - 145 mmol/L   Potassium 4.4 3.5 - 5.1 mmol/L   Chloride 96 (L) 98 - 111 mmol/L   CO2 27 22 - 32 mmol/L   Glucose, Bld 102 (H) 70 - 99 mg/dL    Comment: Glucose reference range applies only to samples taken after fasting for at least 8 hours.   BUN 54 (H) 8 - 23 mg/dL   Creatinine, Ser 6.78 (H) 0.44 - 1.00 mg/dL   Calcium 8.4 (L) 8.9 - 10.3 mg/dL   Total Protein 7.6 6.5 - 8.1 g/dL   Albumin 3.6 3.5 - 5.0 g/dL   AST 19 15 - 41 U/L   ALT 17 0 - 44 U/L   Alkaline Phosphatase 99 38 - 126 U/L   Total Bilirubin 0.8 0.3 - 1.2 mg/dL   GFR, Estimated 6 (L) >60 mL/min    Comment: (NOTE) Calculated using the CKD-EPI Creatinine Equation (2021)    Anion gap 14 5 - 15  Comment: Performed at [facility], [place], [place] [address]  Troponin I (High Sensitivity)     Status: None   Collection Time: 12/14/20 11:01 PM  Result Value Ref Range   Troponin I (High Sensitivity) 15 <18 ng/L    Comment: (NOTE) Elevated high sensitivity troponin I (hsTnI) values and significant  changes across serial measurements may suggest ACS but many other  chronic and acute conditions are known to elevate hsTnI results.  Refer to the "Links"  section for chest pain algorithms and additional  guidance. Performed at [facility], [place], [place] [address]   Troponin I (High Sensitivity)     Status: None   Collection Time: 12/15/20  9:20 AM  Result Value Ref Range   Troponin I (High Sensitivity) 16 <18 ng/L    Comment: (NOTE) Elevated high sensitivity troponin I (hsTnI) values and significant  changes across serial measurements may suggest ACS but many other  chronic and acute conditions are known to elevate hsTnI results.  Refer to the "Links" section for chest pain algorithms and additional  guidance. Performed at [facility], [address] [place], [place] [address]     Assessment/Plan:  Hypertension blood pressure control important in reducing the progression of atherosclerotic disease. On appropriate oral medications.   Type 2 diabetes mellitus (HCC) blood glucose control important in reducing the progression of atherosclerotic disease. Also, involved in wound healing. On appropriate medications.   ESRD on dialysis [facility]) Recommend:  At this time the patient does not have appropriate extremity access for dialysis.  She has been using PermCath now for over 8 months.  Her vein mapping today shows no adequate cephalic vein on the right with a marginal cephalic vein on the left.  Her basilic vein appears adequate bilaterally.  Her arterial circuit appears reasonably normal bilaterally.  Patient should have a left arm AV fistula created.  At the time of surgery, if her cephalic vein is found to be of adequate size for fistula creation, a brachiocephalic AV fistula will be created.  If the cephalic vein is inadequate for fistula creation, a basilic AV fistula be placed.  She knows that her brachiobasilic AV fistula would be a two-stage operation.  The risks, benefits and alternative therapies were reviewed in detail with the patient.  All questions were answered.  The patient agrees  to proceed with surgery.       Leotis Pain 12/23/2020, 9:45 AM   This note was created with Dragon medical transcription system.  Any errors from dictation are unintentional.

## 2021-01-03 ENCOUNTER — Telehealth (INDEPENDENT_AMBULATORY_CARE_PROVIDER_SITE_OTHER): Payer: Self-pay | Admitting: Vascular Surgery

## 2021-01-03 NOTE — Telephone Encounter (Signed)
Documentation only.

## 2021-01-12 ENCOUNTER — Emergency Department: Payer: Medicaid Other

## 2021-01-12 ENCOUNTER — Encounter: Payer: Self-pay | Admitting: Emergency Medicine

## 2021-01-12 ENCOUNTER — Other Ambulatory Visit: Payer: Self-pay

## 2021-01-12 DIAGNOSIS — Z87891 Personal history of nicotine dependence: Secondary | ICD-10-CM | POA: Insufficient documentation

## 2021-01-12 DIAGNOSIS — I132 Hypertensive heart and chronic kidney disease with heart failure and with stage 5 chronic kidney disease, or end stage renal disease: Secondary | ICD-10-CM | POA: Diagnosis not present

## 2021-01-12 DIAGNOSIS — M62830 Muscle spasm of back: Secondary | ICD-10-CM | POA: Insufficient documentation

## 2021-01-12 DIAGNOSIS — J9811 Atelectasis: Secondary | ICD-10-CM | POA: Insufficient documentation

## 2021-01-12 DIAGNOSIS — I509 Heart failure, unspecified: Secondary | ICD-10-CM | POA: Insufficient documentation

## 2021-01-12 DIAGNOSIS — Z79899 Other long term (current) drug therapy: Secondary | ICD-10-CM | POA: Diagnosis not present

## 2021-01-12 DIAGNOSIS — E1122 Type 2 diabetes mellitus with diabetic chronic kidney disease: Secondary | ICD-10-CM | POA: Diagnosis not present

## 2021-01-12 DIAGNOSIS — Z992 Dependence on renal dialysis: Secondary | ICD-10-CM | POA: Insufficient documentation

## 2021-01-12 DIAGNOSIS — N185 Chronic kidney disease, stage 5: Secondary | ICD-10-CM | POA: Insufficient documentation

## 2021-01-12 LAB — BASIC METABOLIC PANEL
Anion gap: 11 (ref 5–15)
BUN: 28 mg/dL — ABNORMAL HIGH (ref 8–23)
CO2: 25 mmol/L (ref 22–32)
Calcium: 8.6 mg/dL — ABNORMAL LOW (ref 8.9–10.3)
Chloride: 100 mmol/L (ref 98–111)
Creatinine, Ser: 3.38 mg/dL — ABNORMAL HIGH (ref 0.44–1.00)
GFR, Estimated: 15 mL/min — ABNORMAL LOW (ref >60)
Glucose, Bld: 85 mg/dL (ref 70–99)
Potassium: 4.5 mmol/L (ref 3.5–5.1)
Sodium: 136 mmol/L (ref 135–145)

## 2021-01-12 LAB — CBC
HCT: 34 % — ABNORMAL LOW (ref 36.0–46.0)
Hemoglobin: 11.1 g/dL — ABNORMAL LOW (ref 12.0–15.0)
MCH: 31.9 pg (ref 26.0–34.0)
MCHC: 32.6 g/dL (ref 30.0–36.0)
MCV: 97.7 fL (ref 80.0–100.0)
Platelets: 427 10*3/uL — ABNORMAL HIGH (ref 150–400)
RBC: 3.48 MIL/uL — ABNORMAL LOW (ref 3.87–5.11)
RDW: 14.6 % (ref 11.5–15.5)
WBC: 8.8 10*3/uL (ref 4.0–10.5)
nRBC: 0 % (ref 0.0–0.2)

## 2021-01-12 MED ORDER — OXYCODONE-ACETAMINOPHEN 5-325 MG PO TABS
1.0000 | ORAL_TABLET | ORAL | Status: DC | PRN
  Administered 2021-01-12: 1 via ORAL
  Filled 2021-01-12: qty 1

## 2021-01-12 NOTE — ED Provider Notes (Signed)
Emergency Medicine Provider Triage Evaluation Note  Katie [name] , a 65 y.o. female  was evaluated in triage.  Pt complains of back pain. This is a chronic issue for her. No change in quality. Taking flexeril.   Review of Systems  Positive: Back pain, spasms, nausea  Negative: Fever, chills  Physical Exam  BP 129/74 (BP Location: Right Arm)   Pulse (!) 101   Temp 98 F (36.7 C) (Oral)   Resp 20   Ht '5\' 6"'$  (1.676 m)   Wt 78.5 kg   SpO2 97%   BMI 27.92 kg/m  Gen:   Awake, no distress   Resp:  Normal effort, wet cough MSK:   Moves extremities without difficulty  Other:    Medical Decision Making  Medically screening exam initiated at 5:38 PM.  Appropriate orders placed.  Katie [name] was informed that the remainder of the evaluation will be completed by another provider, this initial triage assessment does not replace that evaluation, and the importance of remaining in the ED until their evaluation is complete.     Katie Hay, MD 01/12/21 1740

## 2021-01-12 NOTE — ED Triage Notes (Signed)
Pt comes into the ED via POV c/o right side back pain that wraps around the the flank.  Pt states she has pain on the left side as well, but not as bad.  Pt currently in stage 4 kidney failure.  Pt c/o spasms that catch her breathe.  Pt ambulatory to triage at this time.

## 2021-01-13 ENCOUNTER — Emergency Department
Admission: EM | Admit: 2021-01-13 | Discharge: 2021-01-13 | Disposition: A | Discharge status: Home / Self Care | Payer: Medicaid Other | Attending: Emergency Medicine | Admitting: Emergency Medicine

## 2021-01-13 DIAGNOSIS — M62830 Muscle spasm of back: Secondary | ICD-10-CM

## 2021-01-13 LAB — HEPATIC FUNCTION PANEL
ALT: 19 U/L (ref 0–44)
AST: 19 U/L (ref 15–41)
Albumin: 3.2 g/dL — ABNORMAL LOW (ref 3.5–5.0)
Alkaline Phosphatase: 90 U/L (ref 38–126)
Bilirubin, Direct: <0.1 mg/dL (ref 0.0–0.2)
Total Bilirubin: 1.1 mg/dL (ref 0.3–1.2)
Total Protein: 7.6 g/dL (ref 6.5–8.1)

## 2021-01-13 MED ORDER — OXYCODONE HCL 5 MG PO TABS: 5.0000 mg | ORAL_TABLET | Freq: Four times a day (QID) | ORAL | 0 refills | Status: DC | PRN

## 2021-01-13 MED ORDER — MORPHINE SULFATE (PF) 4 MG/ML IV SOLN
4.0000 mg | Freq: Once | INTRAVENOUS | Status: AC
  Administered 2021-01-13: 4 mg via INTRAMUSCULAR
  Filled 2021-01-13: qty 1

## 2021-01-13 NOTE — ED Notes (Signed)
Patient discharged to home per MD order. Patient in stable condition, and deemed medically cleared by ED provider for discharge. Discharge instructions reviewed with patient/family using "Teach Back"; verbalized understanding of medication education and administration, and information about follow-up care. Denies further concerns. ° °

## 2021-01-13 NOTE — Discharge Instructions (Addendum)
No driving tonight or while taking oxycodone. Use only as prescribed.

## 2021-01-13 NOTE — ED Provider Notes (Signed)
[facility] Emergency Department Provider Note   ____________________________________________   Event Date/Time   First MD Initiated Contact with Patient 01/13/21 0101     (approximate)  I have reviewed the triage vital signs and the nursing notes.   HISTORY  Chief Complaint Back Pain    HPI Katie [name] is a 65 y.o. female history of end-stage renal disease on dialysis had dialysis today without issue  Patient reports for about 5 days now she has had spasms and discomfort in her right mid back.  She reports has not pain in her spine but just to the right of the spine and she can feel the muscles in her mid back tensed up.  They will sometimes go in and out of spasm.  She reports he has had this happen to her several times in the last month to month and a half she has been seen in the ER for the same thing and previously will get relief from muscle relaxants or as she had 3 tablets of oxycodone from a previous ED visit about 3 weeks ago that helped quite a bit which she has used.  She told Dr. Zollie Scale her nephrologist and he is made a referral to the pain clinic but she cannot be seen until November  She advised symptoms of the same there is no numbness tingling or weakness there is no fevers or chills with no changes in her urination she does urinate occasionally.  No fever no trouble breathing.  Denies any chest pain or the pain radiates at all to the chest.  No fecal or urinary incontinence.  She reports the same pain is happened multiple times and seems to be some sort of a spasming that will occur in the muscles of her back  Past Medical History:  Diagnosis Date   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[place])    Hypertension    Renal disorder    Type 2 diabetes mellitus ([place])     Patient Active Problem List   Diagnosis Date Noted   ESRD on dialysis ([place]) 12/23/2020   Persistent headaches 06/27/2020   ATN (acute tubular necrosis) ([place])     Pulmonary hypertension, unspecified (HCC)    Chronic kidney disease (CKD), stage IV (severe) (HCC)    Pleural effusion    Acute heart failure with preserved ejection fraction (HFpEF) (HCC)    Pressure injury of skin 05/01/2020   Hyperkalemia    Volume overload 04/30/2020   Type 2 diabetes mellitus with stage 5 chronic kidney disease ([place]) 04/30/2020   Hypertension    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HCC)    Type 2 diabetes mellitus ([place])     Past Surgical History:  Procedure Laterality Date   APPENDECTOMY     DIALYSIS/PERMA CATHETER INSERTION N/A 05/16/2020   Procedure: DIALYSIS/PERMA CATHETER INSERTION;  Surgeon: Algernon Huxley, MD;  Location: [place] [facility];  Service: Cardiovascular;  Laterality: N/A;   MANDIBLE SURGERY     RENAL BIOPSY Right    RIGHT HEART CATH N/A 05/10/2020   Procedure: RIGHT HEART CATH;  Surgeon: Nelva Bush, MD;  Location: [place] [facility];  Service: Cardiovascular;  Laterality: N/A;   TEMPORARY DIALYSIS CATHETER N/A 05/11/2020   Procedure: TEMPORARY DIALYSIS CATHETER;  Surgeon: Algernon Huxley, MD;  Location: [facility];  Service: Cardiovascular;  Laterality: N/A;    Prior to Admission medications   Medication Sig Start Date End Date  Taking? Authorizing Provider  oxyCODONE (OXY IR/ROXICODONE) 5 MG immediate release tablet Take 1 tablet (5 mg total) by mouth every 6 (six) hours as needed for severe pain. 01/13/21  Yes Delman Kitten, MD  carvedilol (COREG) 25 MG tablet Take 1 tablet (25 mg total) by mouth 2 (two) times daily with a meal. 07/02/20 08/01/20  Nolberto Hanlon, MD  cetirizine (ZYRTEC) 10 MG tablet Take 10 mg by mouth daily.    [provider]  folic acid (FOLVITE) 1 MG tablet Take 1 mg by mouth daily.    [provider]  topiramate (TOPAMAX) 25 MG tablet Take 50 mg by mouth 2 (two) times daily. 07/26/20   [provider]  topiramate (TOPAMAX) 50 MG tablet Take 1 tablet (50 mg total) by mouth 2 (two)  times daily. 07/02/20 08/01/20  Nolberto Hanlon, MD  torsemide (DEMADEX) 10 MG tablet Take 1 tablet (10 mg total) by mouth daily. 07/03/20 08/02/20  Nolberto Hanlon, MD  traZODone (DESYREL) 50 MG tablet Take 1 tablet (50 mg total) by mouth every evening. 07/02/20 08/01/20  Nolberto Hanlon, MD  venlafaxine (EFFEXOR) 37.5 MG tablet Take 1 tablet (37.5 mg total) by mouth 2 (two) times daily with a meal. 07/02/20 08/01/20  Nolberto Hanlon, MD  verapamil (CALAN-SR) 240 MG CR tablet Take 1 tablet (240 mg total) by mouth at bedtime. 07/02/20 08/01/20  Nolberto Hanlon, MD    Allergies Penicillins  Family History  Problem Relation Age of Onset   Heart attack Mother    Asthma Mother    Uterine cancer Mother    Skin cancer Father    Prostate cancer Father    CAD Father    Parkinson's disease Father    Lung cancer Brother     Social History Social History   Tobacco Use   Smoking status: Former   Smokeless tobacco: Never  Scientific laboratory technician Use: Never used  Substance Use Topics   Alcohol use: Not Currently    Review of Systems Constitutional: No fever/chills Cardiovascular: Denies chest pain. Respiratory: Denies shortness of breath. Gastrointestinal: No abdominal pain.   Genitourinary: Negative for dysuria. Musculoskeletal: Negative for back pain except as noted.  No falls or injuries skin: Negative for rash. Neurological: Negative for headaches, areas of focal weakness or numbness.    ____________________________________________   PHYSICAL EXAM:  VITAL SIGNS: ED Triage Vitals  Enc Vitals Group     BP 01/12/21 1737 129/74     Pulse Rate 01/12/21 1737 (!) 101     Resp 01/12/21 1737 20     Temp 01/12/21 1737 98 F (36.7 C)     Temp Source 01/12/21 1737 Oral     SpO2 01/12/21 1737 97 %     Weight 01/12/21 1733 173 lb (78.5 kg)     Height 01/12/21 1733 '5\' 6"'$  (1.676 m)     Head Circumference --      Peak Flow --      Pain Score 01/12/21 1733 9     Pain Loc --      Pain Edu? --      Excl. in  [place]? --     Constitutional: Alert and oriented. Well appearing and in no acute distress but does appear appear to be in some pain holding her right hand over her right lower thoracic paraspinous region. Eyes: Conjunctivae are normal. Head: Atraumatic. Nose: No congestion/rhinnorhea. Mouth/Throat: Mucous membranes are moist. Neck: No stridor.  Cardiovascular: Normal rate, regular rhythm. Grossly normal heart sounds.  Good peripheral circulation. Respiratory: Normal respiratory effort.  No retractions. Lungs CTAB. Gastrointestinal: Soft and nontender. No distention. Musculoskeletal: No lower extremity tenderness nor edema.  No tenderness along the cervical thoracic or lumbar midline spine.  She does however report and seems to have apparent spasming and slight fasciculating of the muscles of the right lower thoracic paraspinous region.  They seem tense in this area and she points to this is a localized source of her pain.  There is no overlying lesion or rash. Neurologic:  Normal speech and language. No gross focal neurologic deficits are appreciated.  Skin:  Skin is warm, dry and intact. No rash noted. Psychiatric: Mood and affect are normal. Speech and behavior are normal.  ____________________________________________   LABS (all labs ordered are listed, but only abnormal results are displayed)  Labs Reviewed  BASIC METABOLIC PANEL - Abnormal; Notable for the following components:      Result Value   BUN 28 (*)    Creatinine, Ser 3.38 (*)    Calcium 8.6 (*)    GFR, Estimated 15 (*)    All other components within normal limits  CBC - Abnormal; Notable for the following components:   RBC 3.48 (*)    Hemoglobin 11.1 (*)    HCT 34.0 (*)    Platelets 427 (*)    All other components within normal limits  HEPATIC FUNCTION PANEL - Abnormal; Notable for the following components:   Albumin 3.2 (*)    All other components within normal limits    ____________________________________________  EKG  No chest complaint.  No dyspnea no chest pain ____________________________________________  RADIOLOGY  DG Chest Portable 1 View  Result Date: 01/12/2021 CLINICAL DATA:  Shortness of breath EXAM: PORTABLE CHEST 1 VIEW COMPARISON:  12/15/2020 FINDINGS: Right IJ approach hemodialysis catheter stable in positioning. Stable mild cardiomegaly. Atherosclerotic calcification of the aortic knob. Trace bilateral pleural effusions with associated streaky bibasilar atelectasis. No pneumothorax. IMPRESSION: Trace bilateral pleural effusions with associated streaky bibasilar atelectasis. Electronically Signed   By: Davina Poke D.O.   On: 01/12/2021 18:45    Imaging reviewed notable for trace bilateral pleural effusions.  No noted consolidation.  Of note the patient denies any associated pulmonary symptoms ____________________________________________   PROCEDURES  Procedure(s) performed: None  Procedures  Critical Care performed: No  ____________________________________________   INITIAL IMPRESSION / ASSESSMENT AND PLAN / ED COURSE  Pertinent labs & imaging results that were available during my care of the patient were reviewed by me and considered in my medical decision making (see chart for details).   Patient presents for right sided low thoracic paraspinous pain and discomfort she has an area that is easily reproducible of the tenderness and has had this happen multiple times and has had extensive imaging as noted below.  I suspect this is musculoskeletal in nature and she reports that usually responds well to pain medication.  Given the nature of her discomfort pain and the recurrence of her symptoms I discussed with her and she reports she has had good relief with morphine in the past which we will provide here, monitor closely and see if it improves.  Patient comfortable with this plan, also discussed with her and she would be  comfortable going home with a very short course of oxycodone as she had done in the past and my strong recommendation to follow-up with Dr. Zollie Scale as well as set up a pain medicine consultation.  I do not see evidence by clinical examination or exam  to suggest an acute intra-abdominal pathology acute vascular pathology etc.    Clinical Course as of 01/13/21 0258  Fri Jan 13, 2021  0102 Patient has been seen 3 times in the ED for this pain.  Her work-up has included a chest x-ray, 2 CT renal stones, CT angio to rule out pulmonary embolism, as well as a CT abdomen and pelvis.  She has also had troponins and EKGs.  None of which have found a etiology of her pain. [MQ]    Clinical Course User Index [MQ] Delman Kitten, MD   normal lfts no right upper quadrant pain no associated abdominal pain on exam  ----------------------------------------- 2:57 AM on 01/13/2021 ----------------------------------------- Patient alerts easily to voice fully oriented.  Reports she feels much better pain is essentially gone.  We will discharged home, recommended follow-up with pain clinic as well as her nephrologist.  Patient comfortable with this plan, not driving she uses "uber"  I will prescribe the patient a narcotic pain medicine due to their condition which I anticipate will cause at least moderate pain short term. I discussed with the patient safe use of narcotic pain medicines, and that they are not to drive, work in dangerous areas, or ever take more than prescribed (no more than 1 pill every 6 hours). We discussed that this is the type of medication that can be  overdosed on and the risks of this type of medicine. Patient is very agreeable to only use as prescribed and to never use more than prescribed.  Return precautions and treatment recommendations and follow-up discussed with the patient who is agreeable with the plan.   ____________________________________________   FINAL CLINICAL IMPRESSION(S) / ED  DIAGNOSES  Final diagnoses:  Spasm of thoracolumbar muscle        Note:  This document was prepared using Dragon voice recognition software and may include unintentional dictation errors       Delman Kitten, MD 01/13/21 [phone]

## 2021-01-19 ENCOUNTER — Emergency Department: Payer: Medicaid Other

## 2021-01-19 ENCOUNTER — Other Ambulatory Visit: Payer: Self-pay

## 2021-01-19 ENCOUNTER — Emergency Department
Admission: EM | Admit: 2021-01-19 | Discharge: 2021-01-19 | Disposition: A | Discharge status: Home / Self Care | Payer: Medicaid Other | Attending: Emergency Medicine | Admitting: Emergency Medicine

## 2021-01-19 DIAGNOSIS — Z992 Dependence on renal dialysis: Secondary | ICD-10-CM | POA: Diagnosis not present

## 2021-01-19 DIAGNOSIS — E1122 Type 2 diabetes mellitus with diabetic chronic kidney disease: Secondary | ICD-10-CM | POA: Diagnosis not present

## 2021-01-19 DIAGNOSIS — Z87891 Personal history of nicotine dependence: Secondary | ICD-10-CM | POA: Insufficient documentation

## 2021-01-19 DIAGNOSIS — N186 End stage renal disease: Secondary | ICD-10-CM | POA: Insufficient documentation

## 2021-01-19 DIAGNOSIS — R072 Precordial pain: Secondary | ICD-10-CM | POA: Diagnosis present

## 2021-01-19 DIAGNOSIS — I509 Heart failure, unspecified: Secondary | ICD-10-CM | POA: Diagnosis not present

## 2021-01-19 DIAGNOSIS — R079 Chest pain, unspecified: Secondary | ICD-10-CM

## 2021-01-19 DIAGNOSIS — I132 Hypertensive heart and chronic kidney disease with heart failure and with stage 5 chronic kidney disease, or end stage renal disease: Secondary | ICD-10-CM | POA: Insufficient documentation

## 2021-01-19 DIAGNOSIS — Z79899 Other long term (current) drug therapy: Secondary | ICD-10-CM | POA: Insufficient documentation

## 2021-01-19 DIAGNOSIS — Z20822 Contact with and (suspected) exposure to covid-19: Secondary | ICD-10-CM | POA: Diagnosis not present

## 2021-01-19 LAB — BASIC METABOLIC PANEL
Anion gap: 15 (ref 5–15)
BUN: 69 mg/dL — ABNORMAL HIGH (ref 8–23)
CO2: 22 mmol/L (ref 22–32)
Calcium: 8.4 mg/dL — ABNORMAL LOW (ref 8.9–10.3)
Chloride: 97 mmol/L — ABNORMAL LOW (ref 98–111)
Creatinine, Ser: 6.4 mg/dL — ABNORMAL HIGH (ref 0.44–1.00)
GFR, Estimated: 7 mL/min — ABNORMAL LOW (ref >60)
Glucose, Bld: 103 mg/dL — ABNORMAL HIGH (ref 70–99)
Potassium: 6.2 mmol/L — ABNORMAL HIGH (ref 3.5–5.1)
Sodium: 134 mmol/L — ABNORMAL LOW (ref 135–145)

## 2021-01-19 LAB — CBC
HCT: 34.2 % — ABNORMAL LOW (ref 36.0–46.0)
Hemoglobin: 10.7 g/dL — ABNORMAL LOW (ref 12.0–15.0)
MCH: 30.5 pg (ref 26.0–34.0)
MCHC: 31.3 g/dL (ref 30.0–36.0)
MCV: 97.4 fL (ref 80.0–100.0)
Platelets: 398 10*3/uL (ref 150–400)
RBC: 3.51 MIL/uL — ABNORMAL LOW (ref 3.87–5.11)
RDW: 14.2 % (ref 11.5–15.5)
WBC: 8.9 10*3/uL (ref 4.0–10.5)
nRBC: 0 % (ref 0.0–0.2)

## 2021-01-19 LAB — TROPONIN I (HIGH SENSITIVITY)
Troponin I (High Sensitivity): 13 ng/L (ref <18)
Troponin I (High Sensitivity): 14 ng/L (ref <18)

## 2021-01-19 LAB — RESP PANEL BY RT-PCR (FLU A&B, COVID) ARPGX2
Influenza A by PCR: NEGATIVE
Influenza B by PCR: NEGATIVE
SARS Coronavirus 2 by RT PCR: NEGATIVE

## 2021-01-19 MED ORDER — SODIUM ZIRCONIUM CYCLOSILICATE 10 G PO PACK
10.0000 g | PACK | ORAL | Status: AC
  Administered 2021-01-19: 10 g via ORAL
  Filled 2021-01-19: qty 1

## 2021-01-19 MED ORDER — NITROGLYCERIN 0.4 MG SL SUBL
0.4000 mg | SUBLINGUAL_TABLET | SUBLINGUAL | Status: DC | PRN
  Administered 2021-01-19: 0.4 mg via SUBLINGUAL
  Filled 2021-01-19: qty 1

## 2021-01-19 MED ORDER — OXYCODONE HCL 5 MG PO TABS
5.0000 mg | ORAL_TABLET | ORAL | Status: AC
  Administered 2021-01-19: 5 mg via ORAL
  Filled 2021-01-19: qty 1

## 2021-01-19 NOTE — ED Triage Notes (Signed)
Pt comes into the ED via EMS from dialysis with c/o having chest pain in the left side into the arm 1 hr into her 4hr treatment.  226/143 201/101 2 nitro SL '324mg'$  ASA 155/80 HR 93 98%RA 3/10 pain #[id number] Normally uses a walker, her personal belonging left at dialysis

## 2021-01-19 NOTE — ED Provider Notes (Signed)
[facility] Emergency Department Provider Note  ____________________________________________   Event Date/Time   First MD Initiated Contact with Patient 01/19/21 [id number]     (approximate)  I have reviewed the triage vital signs and the nursing notes.   HISTORY  Chief Complaint Chest Pain   HPI Katie [name] is a 65 y.o. female with past medical history of CHF, pulmonary hypertension, DM, HTN, ESRD on HD (TThS), COPD some chronic back pain going back several months in the left mid back, middle thoracic spine and upper back bilaterally who presents from dialysis after she developed some substernal left-sided chest pain approximate 1 hour interval for her session.  Or other recently missed sessions.  No other recent episodes of chest pain.  She states she received aspirin nitro and feels little better than when it began but still is 4/10 intensity.  She states she has not had her blood pressure medicines this morning.  Per EMS report initial BPs were 226/143 and 201/101.  Patient states that otherwise she has not had any acute shortness of breath, cough, abdominal pain, change in chronic back pain, headache, earache, sore throat rash or extremity pain.  She does note that she typically uses a walker to get around.  She is not on any blood thinners aside from heparin administered during dialysis.  No other acute concerns at this time.  She does note that 3 days ago she had vomited after dialysis but has not had any nausea or vomiting since.         Past Medical History:  Diagnosis Date   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[place])    Hypertension    Renal disorder    Type 2 diabetes mellitus ([place])     Patient Active Problem List   Diagnosis Date Noted   ESRD on dialysis ([place]) 12/23/2020   Persistent headaches 06/27/2020   ATN (acute tubular necrosis) ([place])    Pulmonary hypertension, unspecified (HCC)    Chronic kidney disease (CKD), stage IV (severe)  (HCC)    Pleural effusion    Acute heart failure with preserved ejection fraction (HFpEF) (HCC)    Pressure injury of skin 05/01/2020   Hyperkalemia    Volume overload 04/30/2020   Type 2 diabetes mellitus with stage 5 chronic kidney disease ([place]) 04/30/2020   Hypertension    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HCC)    Type 2 diabetes mellitus ([place])     Past Surgical History:  Procedure Laterality Date   APPENDECTOMY     DIALYSIS/PERMA CATHETER INSERTION N/A 05/16/2020   Procedure: DIALYSIS/PERMA CATHETER INSERTION;  Surgeon: Algernon Huxley, MD;  Location: [place] [facility];  Service: Cardiovascular;  Laterality: N/A;   MANDIBLE SURGERY     RENAL BIOPSY Right    RIGHT HEART CATH N/A 05/10/2020   Procedure: RIGHT HEART CATH;  Surgeon: Nelva Bush, MD;  Location: [place] [facility];  Service: Cardiovascular;  Laterality: N/A;   TEMPORARY DIALYSIS CATHETER N/A 05/11/2020   Procedure: TEMPORARY DIALYSIS CATHETER;  Surgeon: Algernon Huxley, MD;  Location: [place] [facility];  Service: Cardiovascular;  Laterality: N/A;    Prior to Admission medications   Medication Sig Start Date End Date Taking? Authorizing Provider  carvedilol (COREG) 25 MG tablet Take 1 tablet (25 mg total) by mouth 2 (two) times daily with a meal. 07/02/20 08/01/20  Nolberto Hanlon, MD  cetirizine (ZYRTEC) 10 MG tablet Take 10 mg by mouth  daily.    [provider]  folic acid (FOLVITE) 1 MG tablet Take 1 mg by mouth daily.    [provider]  oxyCODONE (OXY IR/ROXICODONE) 5 MG immediate release tablet Take 1 tablet (5 mg total) by mouth every 6 (six) hours as needed for severe pain. 01/13/21   Delman Kitten, MD  topiramate (TOPAMAX) 25 MG tablet Take 50 mg by mouth 2 (two) times daily. 07/26/20   [provider]  topiramate (TOPAMAX) 50 MG tablet Take 1 tablet (50 mg total) by mouth 2 (two) times daily. 07/02/20 08/01/20  Nolberto Hanlon, MD  torsemide (DEMADEX) 10 MG tablet Take 1 tablet (10  mg total) by mouth daily. 07/03/20 08/02/20  Nolberto Hanlon, MD  traZODone (DESYREL) 50 MG tablet Take 1 tablet (50 mg total) by mouth every evening. 07/02/20 08/01/20  Nolberto Hanlon, MD  venlafaxine (EFFEXOR) 37.5 MG tablet Take 1 tablet (37.5 mg total) by mouth 2 (two) times daily with a meal. 07/02/20 08/01/20  Nolberto Hanlon, MD  verapamil (CALAN-SR) 240 MG CR tablet Take 1 tablet (240 mg total) by mouth at bedtime. 07/02/20 08/01/20  Nolberto Hanlon, MD    Allergies Penicillins  Family History  Problem Relation Age of Onset   Heart attack Mother    Asthma Mother    Uterine cancer Mother    Skin cancer Father    Prostate cancer Father    CAD Father    Parkinson's disease Father    Lung cancer Brother     Social History Social History   Tobacco Use   Smoking status: Former   Smokeless tobacco: Never  Scientific laboratory technician Use: Never used  Substance Use Topics   Alcohol use: Not Currently    Review of Systems  Review of Systems  Constitutional:  Negative for chills and fever.  HENT:  Negative for sore throat.   Eyes:  Negative for pain.  Respiratory:  Negative for cough and stridor.   Cardiovascular:  Positive for chest pain.  Gastrointestinal:  Positive for vomiting (3 days ago but not since).  Genitourinary:  Negative for dysuria.  Musculoskeletal:  Negative for myalgias.  Skin:  Negative for rash.  Neurological:  Negative for seizures, loss of consciousness and headaches.  Psychiatric/Behavioral:  Negative for suicidal ideas.   All other systems reviewed and are negative.    ____________________________________________   PHYSICAL EXAM:  VITAL SIGNS: ED Triage Vitals [01/19/21 0832]  Enc Vitals Group     BP 138/78     Pulse Rate 97     Resp 16     Temp 98.7 F (37.1 C)     Temp Source Oral     SpO2 96 %     Weight      Height      Head Circumference      Peak Flow      Pain Score      Pain Loc      Pain Edu?      Excl. in [place]?    Vitals:   01/19/21 1100  01/19/21 1115  BP: (!) 166/78   Pulse: 92 88  Resp: 17   Temp:    SpO2: 96% 97%   Physical Exam Vitals and nursing note reviewed.  Constitutional:      General: She is not in acute distress.    Appearance: She is well-developed. She is obese.  HENT:     Head: Normocephalic and atraumatic.     Right Ear: External  ear normal.     Left Ear: External ear normal.     Nose: Nose normal.  Eyes:     Conjunctiva/sclera: Conjunctivae normal.  Cardiovascular:     Rate and Rhythm: Normal rate and regular rhythm.     Heart sounds: No murmur heard. Pulmonary:     Effort: Pulmonary effort is normal. No respiratory distress.     Breath sounds: Normal breath sounds.  Abdominal:     Palpations: Abdomen is soft.     Tenderness: There is no abdominal tenderness.  Musculoskeletal:     Cervical back: Neck supple.  Skin:    General: Skin is warm and dry.     Capillary Refill: Capillary refill takes less than 2 seconds.  Neurological:     Mental Status: She is alert and oriented to person, place, and time.  Psychiatric:        Mood and Affect: Mood normal.    Chest is unremarkable for overlying skin changes.  No significant reproducible pain.  Allises catheter is in place in right chest without significant surrounding skin changes.  Abdomen is soft nontender throughout. ____________________________________________   LABS (all labs ordered are listed, but only abnormal results are displayed)  Labs Reviewed  CBC - Abnormal; Notable for the following components:      Result Value   RBC 3.51 (*)    Hemoglobin 10.7 (*)    HCT 34.2 (*)    All other components within normal limits  BASIC METABOLIC PANEL - Abnormal; Notable for the following components:   Sodium 134 (*)    Potassium 6.2 (*)    Chloride 97 (*)    Glucose, Bld 103 (*)    BUN 69 (*)    Creatinine, Ser 6.40 (*)    Calcium 8.4 (*)    GFR, Estimated 7 (*)    All other components within normal limits  RESP PANEL BY RT-PCR (FLU  A&B, COVID) ARPGX2  BASIC METABOLIC PANEL  TROPONIN I (HIGH SENSITIVITY)  TROPONIN I (HIGH SENSITIVITY)   ____________________________________________  EKG  Sinus rhythm with a ventricular rate of 97, normal axis, unremarkable intervals without clear evidence of acute ischemia or significant arrhythmia. ____________________________________________  RADIOLOGY  ED MD interpretation: Chest x-ray has no overt edema, focal consolidation, pneumothorax or other clear acute process.  There is very trace bilateral pleural effusions. Official radiology report(s): DG Chest 2 View  Result Date: 01/19/2021 CLINICAL DATA:  Chest pain EXAM: CHEST - 2 VIEW COMPARISON:  01/12/2021 FINDINGS: Minimal bilateral pleural effusions again seen. Minimal bibasilar atelectasis. Right IJ hemodialysis catheter in appropriate position. Heart size within normal limits. No significant pulmonary vascular congestion. IMPRESSION: Minimal bibasilar atelectasis and minimal bilateral pleural effusions. Electronically Signed   By: Miachel Roux M.D.   On: 01/19/2021 09:13    ____________________________________________   PROCEDURES  Procedure(s) performed (including Critical Care):  .1-3 Lead EKG Interpretation Performed by: Lucrezia Starch, MD Authorized by: Lucrezia Starch, MD     Interpretation: normal     ECG rate assessment: normal     Rhythm: sinus rhythm     Ectopy: none     Conduction: normal     ____________________________________________   INITIAL IMPRESSION / ASSESSMENT AND PLAN / ED COURSE     Patient presents with above to history exam for assessment of some chest discomfort she developed a fall undergoing dialysis today.  She was given ASA and nitroglycerin with EMS was reportedly she was quite hypertensive with systolics above [id number].  On arrival patient is normotensive at 130/78 with otherwise stable vital signs on room air.  Differential includes ACS, pneumonia, pericarditis, myocarditis,  anemia, metabolic derangements and pneumonia.  I have low suspicion for PE at this time given absence of tachycardia, tachypnea, hypoxia.   Chest x-ray has no overt edema, focal consolidation, pneumothorax or other clear acute process.  There is very trace bilateral pleural effusions.  ECG and nonelevated troponin x2 are not suggestive of ACS or myocarditis.  COVID influenza is negative.  CBC shows no leukocytosis or acute anemia.  Overall I have low suspicion for acute infectious process.  BMP remarkable for mild hypokalemia with a K of 6.2, BUN of 69 and a creatinine of 6.4 consistent with patient's history of ESRD and on pending partial dialysis today.  No other significant electrolyte metabolic derangements.  Reassessment patient states her chest pain is resolved she is feeling much better.  Given stable vitals with eyes reassuring exam work-up I have low suspicion for other immediate life-threatening process at this time.  Unclear if her chest discomfort was related to hypertensive urgency that resolved in the emergency room or some other cause.  Advised her that her potassium was elevated today and that she absolutely needs to get dialyzed this afternoon or first thing in the morning but that she was unable to do so in the next 24 hours she must return immediately to emergency room.  I will give a dose of Lokelma in the meantime.  She has no EKG changes and given he states she is feeling much better I think she is stable for discharge with outpatient PCP follow-up.  Discharged stable condition.  Strict return precautions advised and discussed.      ____________________________________________   FINAL CLINICAL IMPRESSION(S) / ED DIAGNOSES  Final diagnoses:  Chest pain, unspecified type  ESRD (end stage renal disease) (HCC)    Medications  nitroGLYCERIN (NITROSTAT) SL tablet 0.4 mg (0.4 mg Sublingual Given 01/19/21 0912)  sodium zirconium cyclosilicate (LOKELMA) packet 10 g (has no  administration in time range)  oxyCODONE (Oxy IR/ROXICODONE) immediate release tablet 5 mg (5 mg Oral Given 01/19/21 0912)     ED Discharge Orders     None        Note:  This document was prepared using Dragon voice recognition software and may include unintentional dictation errors.    Lucrezia Starch, MD 01/19/21 [phone]

## 2021-01-19 NOTE — ED Notes (Signed)
See triage note. Pt now states 1/10 CP. Dr Tamala Julian at bedside. Also c/o 7/10 back pain. Has chronic back pain but this is worse.

## 2021-01-19 NOTE — ED Notes (Signed)
Green top hemolyzed per lab. Redrawn and sent to lab.

## 2021-01-19 NOTE — Discharge Instructions (Signed)
It is critically important she go to dialysis this afternoon or first thing in the morning.  If you are able to do so you must come back to the emergency room immediately as you absolutely need to be dialyzed with the next 24 hours due to your potassium being elevated today.

## 2021-01-19 NOTE — ED Notes (Signed)
Informed Dr Tamala Julian potassium 6.2.

## 2021-01-20 ENCOUNTER — Telehealth (INDEPENDENT_AMBULATORY_CARE_PROVIDER_SITE_OTHER): Payer: Self-pay

## 2021-01-20 NOTE — Telephone Encounter (Signed)
Reach out to the patient to schedule AVF creation. I left a message on the patient voicemail to return a call.

## 2021-02-02 ENCOUNTER — Emergency Department
Admission: EM | Admit: 2021-02-02 | Discharge: 2021-02-02 | Disposition: A | Discharge status: Home / Self Care | Payer: Medicaid Other | Attending: Student in an Organized Health Care Education/Training Program | Admitting: Student in an Organized Health Care Education/Training Program

## 2021-02-02 ENCOUNTER — Other Ambulatory Visit: Payer: Self-pay

## 2021-02-02 ENCOUNTER — Emergency Department: Payer: Medicaid Other

## 2021-02-02 DIAGNOSIS — E1122 Type 2 diabetes mellitus with diabetic chronic kidney disease: Secondary | ICD-10-CM | POA: Diagnosis not present

## 2021-02-02 DIAGNOSIS — M545 Low back pain, unspecified: Secondary | ICD-10-CM | POA: Diagnosis not present

## 2021-02-02 DIAGNOSIS — Z87891 Personal history of nicotine dependence: Secondary | ICD-10-CM | POA: Insufficient documentation

## 2021-02-02 DIAGNOSIS — I509 Heart failure, unspecified: Secondary | ICD-10-CM | POA: Insufficient documentation

## 2021-02-02 DIAGNOSIS — Z79899 Other long term (current) drug therapy: Secondary | ICD-10-CM | POA: Diagnosis not present

## 2021-02-02 DIAGNOSIS — Z955 Presence of coronary angioplasty implant and graft: Secondary | ICD-10-CM | POA: Diagnosis not present

## 2021-02-02 DIAGNOSIS — R109 Unspecified abdominal pain: Secondary | ICD-10-CM | POA: Insufficient documentation

## 2021-02-02 DIAGNOSIS — I132 Hypertensive heart and chronic kidney disease with heart failure and with stage 5 chronic kidney disease, or end stage renal disease: Secondary | ICD-10-CM | POA: Insufficient documentation

## 2021-02-02 DIAGNOSIS — N186 End stage renal disease: Secondary | ICD-10-CM | POA: Diagnosis not present

## 2021-02-02 DIAGNOSIS — Z992 Dependence on renal dialysis: Secondary | ICD-10-CM | POA: Insufficient documentation

## 2021-02-02 LAB — BASIC METABOLIC PANEL
Anion gap: 19 — ABNORMAL HIGH (ref 5–15)
BUN: 45 mg/dL — ABNORMAL HIGH (ref 8–23)
CO2: 20 mmol/L — ABNORMAL LOW (ref 22–32)
Calcium: 8.7 mg/dL — ABNORMAL LOW (ref 8.9–10.3)
Chloride: 95 mmol/L — ABNORMAL LOW (ref 98–111)
Creatinine, Ser: 4.97 mg/dL — ABNORMAL HIGH (ref 0.44–1.00)
GFR, Estimated: 9 mL/min — ABNORMAL LOW (ref >60)
Glucose, Bld: 124 mg/dL — ABNORMAL HIGH (ref 70–99)
Potassium: 5.4 mmol/L — ABNORMAL HIGH (ref 3.5–5.1)
Sodium: 134 mmol/L — ABNORMAL LOW (ref 135–145)

## 2021-02-02 LAB — CBC
HCT: 33 % — ABNORMAL LOW (ref 36.0–46.0)
Hemoglobin: 10.9 g/dL — ABNORMAL LOW (ref 12.0–15.0)
MCH: 32.6 pg (ref 26.0–34.0)
MCHC: 33 g/dL (ref 30.0–36.0)
MCV: 98.8 fL (ref 80.0–100.0)
Platelets: 348 10*3/uL (ref 150–400)
RBC: 3.34 MIL/uL — ABNORMAL LOW (ref 3.87–5.11)
RDW: 14.5 % (ref 11.5–15.5)
WBC: 9.6 10*3/uL (ref 4.0–10.5)
nRBC: 0 % (ref 0.0–0.2)

## 2021-02-02 LAB — HEPATIC FUNCTION PANEL
ALT: 20 U/L (ref 0–44)
AST: 19 U/L (ref 15–41)
Albumin: 3.3 g/dL — ABNORMAL LOW (ref 3.5–5.0)
Alkaline Phosphatase: 81 U/L (ref 38–126)
Bilirubin, Direct: 0.2 mg/dL (ref 0.0–0.2)
Indirect Bilirubin: 0.6 mg/dL (ref 0.3–0.9)
Total Bilirubin: 0.8 mg/dL (ref 0.3–1.2)
Total Protein: 6.8 g/dL (ref 6.5–8.1)

## 2021-02-02 LAB — LIPASE, BLOOD: Lipase: 43 U/L (ref 11–51)

## 2021-02-02 MED ORDER — OXYCODONE HCL 5 MG PO TABS: 5.0000 mg | ORAL_TABLET | Freq: Four times a day (QID) | ORAL | 0 refills | Status: DC | PRN

## 2021-02-02 MED ORDER — OXYCODONE-ACETAMINOPHEN 5-325 MG PO TABS
1.0000 | ORAL_TABLET | Freq: Once | ORAL | Status: AC
  Administered 2021-02-02: 1 via ORAL
  Filled 2021-02-02: qty 1

## 2021-02-02 MED ORDER — ONDANSETRON HCL 4 MG/2ML IJ SOLN
4.0000 mg | Freq: Once | INTRAMUSCULAR | Status: AC
  Administered 2021-02-02: 4 mg via INTRAVENOUS

## 2021-02-02 MED ORDER — LIDOCAINE 5 % EX PTCH
1.0000 | MEDICATED_PATCH | CUTANEOUS | Status: DC
  Administered 2021-02-02: 1 via TRANSDERMAL
  Filled 2021-02-02: qty 1

## 2021-02-02 MED ORDER — FENTANYL CITRATE PF 50 MCG/ML IJ SOSY
50.0000 ug | PREFILLED_SYRINGE | INTRAMUSCULAR | Status: DC | PRN
  Administered 2021-02-02: 50 ug via INTRAVENOUS
  Filled 2021-02-02: qty 1

## 2021-02-02 MED ORDER — SODIUM ZIRCONIUM CYCLOSILICATE 10 G PO PACK
10.0000 g | PACK | Freq: Once | ORAL | Status: AC
  Administered 2021-02-02: 10 g via ORAL
  Filled 2021-02-02: qty 1

## 2021-02-02 NOTE — ED Triage Notes (Signed)
Pt comes into the ED via EMS from dialysis with c/o cramping in her kidneys and her back for the past 4 days, only received 30 min of treatment Pt is rocking back in forth in the wheelchair on arrival. 154/78 96%RA 102HR #[id number]

## 2021-02-02 NOTE — ED Provider Notes (Signed)
[facility] Emergency Department Provider Note    Event Date/Time   First MD Initiated Contact with Patient 02/02/21 [phone]     (approximate)  I have reviewed the triage vital signs and the nursing notes.   HISTORY  Chief Complaint Flank Pain    HPI Katie [name] is a 65 y.o. female extensive past medical history as listed below on dialysis presents to the ER from dialysis after about 30 minutes of her session for right flank pain.  States his symptoms are pretty much identical to the last time she was here in August.  Denies any chest pain or shortness of breath.  Describes it as a muscle spasm.  States she was given Percocet 3 of which lasted her nearly a month and she is requesting the same today.  Denies any swelling.  No nausea or vomiting.  Does still make urine but denies any dysuria.  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)    Hypertension    Renal disorder    Type 2 diabetes mellitus ([place])    Family History  Problem Relation Age of Onset   Heart attack Mother    Asthma Mother    Uterine cancer Mother    Skin cancer Father    Prostate cancer Father    CAD Father    Parkinson's disease Father    Lung cancer Brother    Past Surgical History:  Procedure Laterality Date   APPENDECTOMY     DIALYSIS/PERMA CATHETER INSERTION N/A 05/16/2020   Procedure: DIALYSIS/PERMA CATHETER INSERTION;  Surgeon: Algernon Huxley, MD;  Location: [place] [facility];  Service: Cardiovascular;  Laterality: N/A;   MANDIBLE SURGERY     RENAL BIOPSY Right    RIGHT HEART CATH N/A 05/10/2020   Procedure: RIGHT HEART CATH;  Surgeon: Nelva Bush, MD;  Location: [place] [facility];  Service: Cardiovascular;  Laterality: N/A;   TEMPORARY DIALYSIS CATHETER N/A 05/11/2020   Procedure: TEMPORARY DIALYSIS CATHETER;  Surgeon: Algernon Huxley, MD;  Location: [place] [facility];  Service: Cardiovascular;  Laterality: N/A;    Patient Active Problem List   Diagnosis Date Noted   ESRD on dialysis ([place]) 12/23/2020   Persistent headaches 06/27/2020   ATN (acute tubular necrosis) ([place])    Pulmonary hypertension, unspecified (HCC)    Chronic kidney disease (CKD), stage IV (severe) (HCC)    Pleural effusion    Acute heart failure with preserved ejection fraction (HFpEF) ([place])    Pressure injury of skin 05/01/2020   Hyperkalemia    Volume overload 04/30/2020   Type 2 diabetes mellitus with stage 5 chronic kidney disease ([place]) 04/30/2020   Hypertension    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[place])    Type 2 diabetes mellitus ([place])       Prior to Admission medications   Medication Sig Start Date End Date Taking? Authorizing Provider  carvedilol (COREG) 25 MG tablet Take 1 tablet (25 mg total) by mouth 2 (two) times daily with a meal. 07/02/20 08/01/20  Nolberto Hanlon, MD  cetirizine (ZYRTEC) 10 MG tablet Take 10 mg by mouth daily.    [provider]  folic acid (FOLVITE) 1 MG tablet Take 1 mg by mouth daily.    [provider]  oxyCODONE (OXY IR/ROXICODONE) 5 MG immediate release tablet Take 1 tablet (5 mg total) by mouth every 6 (six) hours as needed for severe  pain. 02/02/21   Merlyn Lot, MD  topiramate (TOPAMAX) 25 MG tablet Take 50 mg by mouth 2 (two) times daily. 07/26/20   [provider]  topiramate (TOPAMAX) 50 MG tablet Take 1 tablet (50 mg total) by mouth 2 (two) times daily. 07/02/20 08/01/20  Nolberto Hanlon, MD  torsemide (DEMADEX) 10 MG tablet Take 1 tablet (10 mg total) by mouth daily. 07/03/20 08/02/20  Nolberto Hanlon, MD  traZODone (DESYREL) 50 MG tablet Take 1 tablet (50 mg total) by mouth every evening. 07/02/20 08/01/20  Nolberto Hanlon, MD  venlafaxine (EFFEXOR) 37.5 MG tablet Take 1 tablet (37.5 mg total) by mouth 2 (two) times daily with a meal. 07/02/20 08/01/20  Nolberto Hanlon, MD  verapamil (CALAN-SR) 240 MG CR tablet Take 1 tablet (240 mg total) by mouth at bedtime.  07/02/20 08/01/20  Nolberto Hanlon, MD    Allergies Penicillins    Social History Social History   Tobacco Use   Smoking status: Former   Smokeless tobacco: Never  Scientific laboratory technician Use: Never used  Substance Use Topics   Alcohol use: Not Currently    Review of Systems Patient denies headaches, rhinorrhea, blurry vision, numbness, shortness of breath, chest pain, edema, cough, abdominal pain, nausea, vomiting, diarrhea, dysuria, fevers, rashes or hallucinations unless otherwise stated above in HPI. ____________________________________________   PHYSICAL EXAM:  VITAL SIGNS: Vitals:   02/02/21 0747  BP: 134/81  Pulse: (!) 103  Resp: (!) 24  Temp: 98.5 F (36.9 C)  SpO2: 93%    Constitutional: Alert and oriented.  Eyes: Conjunctivae are normal.  Head: Atraumatic. Nose: No congestion/rhinnorhea. Mouth/Throat: Mucous membranes are moist.   Neck: No stridor. Painless ROM.  Cardiovascular: Normal rate, regular rhythm. Grossly normal heart sounds.  Good peripheral circulation. Respiratory: Normal respiratory effort.  No retractions. Lungs CTAB. Gastrointestinal: Soft and nontender. No distention. No abdominal bruits. TTP along right low back Genitourinary:  Musculoskeletal: No lower extremity tenderness nor edema.  No joint effusions. Neurologic:  Normal speech and language. No gross focal neurologic deficits are appreciated. No facial droop Skin:  Skin is warm, dry and intact. No rash noted. Psychiatric: Mood and affect are normal. Speech and behavior are normal.  ____________________________________________   LABS (all labs ordered are listed, but only abnormal results are displayed)  Results for orders placed or performed during the hospital encounter of 02/02/21 (from the past 24 hour(s))  CBC     Status: Abnormal   Collection Time: 02/02/21  7:43 AM  Result Value Ref Range   WBC 9.6 4.0 - 10.5 K/uL   RBC 3.34 (L) 3.87 - 5.11 MIL/uL   Hemoglobin 10.9 (L) 12.0  - 15.0 g/dL   HCT 33.0 (L) 36.0 - 46.0 %   MCV 98.8 80.0 - 100.0 fL   MCH 32.6 26.0 - 34.0 pg   MCHC 33.0 30.0 - 36.0 g/dL   RDW 14.5 11.5 - 15.5 %   Platelets 348 150 - 400 K/uL   nRBC 0.0 0.0 - 0.2 %  Basic metabolic panel     Status: Abnormal   Collection Time: 02/02/21  7:43 AM  Result Value Ref Range   Sodium 134 (L) 135 - 145 mmol/L   Potassium 5.4 (H) 3.5 - 5.1 mmol/L   Chloride 95 (L) 98 - 111 mmol/L   CO2 20 (L) 22 - 32 mmol/L   Glucose, Bld 124 (H) 70 - 99 mg/dL   BUN 45 (H) 8 - 23 mg/dL   Creatinine, Ser 4.97 (  H) 0.44 - 1.00 mg/dL   Calcium 8.7 (L) 8.9 - 10.3 mg/dL   GFR, Estimated 9 (L) >60 mL/min   Anion gap 19 (H) 5 - 15  Hepatic function panel     Status: Abnormal   Collection Time: 02/02/21  7:43 AM  Result Value Ref Range   Total Protein 6.8 6.5 - 8.1 g/dL   Albumin 3.3 (L) 3.5 - 5.0 g/dL   AST 19 15 - 41 U/L   ALT 20 0 - 44 U/L   Alkaline Phosphatase 81 38 - 126 U/L   Total Bilirubin 0.8 0.3 - 1.2 mg/dL   Bilirubin, Direct 0.2 0.0 - 0.2 mg/dL   Indirect Bilirubin 0.6 0.3 - 0.9 mg/dL  Lipase, blood     Status: None   Collection Time: 02/02/21  7:43 AM  Result Value Ref Range   Lipase 43 11 - 51 U/L   ____________________________________________  EKG My review and personal interpretation at Time: 7:48   Indication: flank pain  Rate: 100  Rhythm: sinus Axis: normal Other: normal intervals, no stemi ____________________________________________  RADIOLOGY  I personally reviewed all radiographic images ordered to evaluate for the above acute complaints and reviewed radiology reports and findings.  These findings were personally discussed with the patient.  Please see medical record for radiology report.  ____________________________________________   PROCEDURES  Procedure(s) performed:  Procedures    Critical Care performed: no ____________________________________________   INITIAL IMPRESSION / ASSESSMENT AND PLAN / ED COURSE  Pertinent  labs & imaging results that were available during my care of the patient were reviewed by me and considered in my medical decision making (see chart for details).   DDX: Musculoskeletal strain, stone, AAA, dissection, ACS, PE, electrolyte abnormality  Katie [name] is a 65 y.o. who presents to the ED with presentation as described above.  Patient uncomfortable appearing but afebrile nontoxic.  She not short of breath.  No pleuritic pain.  Very reproducible right low back pain denies any dysuria or hematuria.  Has had extensive work-up for similar presentation just 1 month ago including CTA as well as CT abdomen imaging no history of kidney stone.  She feels like this is muscle spasm.  She not complaining of any chest pain or shortness of breath at this time her EKG is stable.  Blood will be ordered for the blood differential will provide pain medication.  Clinical Course as of 02/02/21 1111  Thu Feb 02, 2021  1052 Patient reassessed.  Feels much improved. [PR]  1106 Regarding her borderline elevated potassium discussed case with Dr. Candiss Norse recommends single dose of oral Lokelma and can continue with outpatient dialysis.  She remains hemodynamically stable her pain is improved.  This is not consistent with ACS stone or infection.  She denies any dysuria.  Her abdominal exam is soft benign.  Not consistent with AAA or dissection had extensive work-up for similar pain and symptoms 1 month ago with reassuring work-up and as this is Medical presentation do feel outpatient follow-up is appropriate.  Agreeable to plan. [PR]    Clinical Course User Index [PR] Merlyn Lot, MD    The patient was evaluated in Emergency Department today for the symptoms described in the history of present illness. He/she was evaluated in the context of the global COVID-19 pandemic, which necessitated consideration that the patient might be at risk for infection with the SARS-CoV-2 virus that causes COVID-19. Institutional  protocols and algorithms that pertain to the evaluation of patients at risk for  COVID-19 are in a state of rapid change based on information released by regulatory bodies including the CDC and federal and state organizations. These policies and algorithms were followed during the patient's care in the ED.  As part of my medical decision making, I reviewed the following data within the [place] notes reviewed and incorporated, Labs reviewed, notes from prior ED visits and [place] Controlled Substance Database   ____________________________________________   FINAL CLINICAL IMPRESSION(S) / ED DIAGNOSES  Final diagnoses:  Acute right-sided low back pain without sciatica  ESRD on dialysis [facility])      NEW MEDICATIONS STARTED DURING THIS VISIT:  Current Discharge Medication List       Note:  This document was prepared using Dragon voice recognition software and may include unintentional dictation errors.    Merlyn Lot, MD 02/02/21 1120

## 2021-02-06 ENCOUNTER — Ambulatory Visit: Payer: Medicaid Other

## 2021-02-11 ENCOUNTER — Other Ambulatory Visit: Payer: Self-pay

## 2021-02-11 ENCOUNTER — Emergency Department
Admission: EM | Admit: 2021-02-11 | Discharge: 2021-02-11 | Disposition: A | Discharge status: Home / Self Care | Payer: Medicaid Other | Attending: Emergency Medicine | Admitting: Emergency Medicine

## 2021-02-11 DIAGNOSIS — I120 Hypertensive chronic kidney disease with stage 5 chronic kidney disease or end stage renal disease: Secondary | ICD-10-CM | POA: Diagnosis not present

## 2021-02-11 DIAGNOSIS — N186 End stage renal disease: Secondary | ICD-10-CM | POA: Diagnosis not present

## 2021-02-11 DIAGNOSIS — Z7982 Long term (current) use of aspirin: Secondary | ICD-10-CM | POA: Diagnosis not present

## 2021-02-11 DIAGNOSIS — Z992 Dependence on renal dialysis: Secondary | ICD-10-CM | POA: Insufficient documentation

## 2021-02-11 DIAGNOSIS — Z760 Encounter for issue of repeat prescription: Secondary | ICD-10-CM | POA: Insufficient documentation

## 2021-02-11 DIAGNOSIS — G894 Chronic pain syndrome: Secondary | ICD-10-CM | POA: Insufficient documentation

## 2021-02-11 DIAGNOSIS — Z87891 Personal history of nicotine dependence: Secondary | ICD-10-CM | POA: Insufficient documentation

## 2021-02-11 DIAGNOSIS — Z79899 Other long term (current) drug therapy: Secondary | ICD-10-CM | POA: Insufficient documentation

## 2021-02-11 DIAGNOSIS — R109 Unspecified abdominal pain: Secondary | ICD-10-CM | POA: Insufficient documentation

## 2021-02-11 DIAGNOSIS — E1122 Type 2 diabetes mellitus with diabetic chronic kidney disease: Secondary | ICD-10-CM | POA: Diagnosis not present

## 2021-02-11 MED ORDER — CYCLOBENZAPRINE HCL 10 MG PO TABS
10.0000 mg | ORAL_TABLET | Freq: Once | ORAL | Status: AC
  Administered 2021-02-11: 10 mg via ORAL
  Filled 2021-02-11: qty 1

## 2021-02-11 MED ORDER — CYCLOBENZAPRINE HCL 5 MG PO TABS: 5.0000 mg | ORAL_TABLET | Freq: Three times a day (TID) | ORAL | 0 refills | Status: DC | PRN

## 2021-02-11 MED ORDER — OXYCODONE HCL 5 MG PO TABS
5.0000 mg | ORAL_TABLET | Freq: Once | ORAL | Status: AC
Start: 2021-02-11 — End: 2021-02-11
  Administered 2021-02-11: 5 mg via ORAL
  Filled 2021-02-11: qty 1

## 2021-02-11 MED ORDER — OXYCODONE HCL 5 MG PO TABS: 5.0000 mg | ORAL_TABLET | Freq: Three times a day (TID) | ORAL | 0 refills | Status: AC | PRN

## 2021-02-11 NOTE — Discharge Instructions (Signed)
Take the prescription meds as directed. Follow-up with Dr. Holley Raring as needed.

## 2021-02-11 NOTE — ED Triage Notes (Signed)
Pt comes pov with right sided kidney pain. States that she has stage 4 renal disease. Had dialysis this morning. States she came here for pain medication and last time they gave her 3 percoset. States she hasn't gotten set up with primary care and renal MD can't rx pain meds.

## 2021-02-11 NOTE — ED Notes (Signed)
Patient assisted with uber app for safe discharge home.

## 2021-02-11 NOTE — ED Notes (Signed)
Rn to bedside. Pt in no acute ditress. She states "I am here often and they never make me provide a urine specimen. They refinl my medication and I leave".

## 2021-02-22 ENCOUNTER — Telehealth (INDEPENDENT_AMBULATORY_CARE_PROVIDER_SITE_OTHER): Payer: Self-pay

## 2021-02-22 NOTE — Telephone Encounter (Signed)
Reach out to the patient to schedule surgery and a message was left on patient voicemail.

## 2021-03-06 ENCOUNTER — Emergency Department: Payer: Medicaid Other

## 2021-03-06 ENCOUNTER — Other Ambulatory Visit: Payer: Self-pay

## 2021-03-06 ENCOUNTER — Emergency Department
Admission: EM | Admit: 2021-03-06 | Discharge: 2021-03-06 | Disposition: A | Discharge status: Home / Self Care | Payer: Medicaid Other | Attending: Student in an Organized Health Care Education/Training Program | Admitting: Student in an Organized Health Care Education/Training Program

## 2021-03-06 DIAGNOSIS — I509 Heart failure, unspecified: Secondary | ICD-10-CM | POA: Insufficient documentation

## 2021-03-06 DIAGNOSIS — N186 End stage renal disease: Secondary | ICD-10-CM | POA: Insufficient documentation

## 2021-03-06 DIAGNOSIS — G8929 Other chronic pain: Secondary | ICD-10-CM | POA: Diagnosis not present

## 2021-03-06 DIAGNOSIS — E1122 Type 2 diabetes mellitus with diabetic chronic kidney disease: Secondary | ICD-10-CM | POA: Insufficient documentation

## 2021-03-06 DIAGNOSIS — Z87891 Personal history of nicotine dependence: Secondary | ICD-10-CM | POA: Insufficient documentation

## 2021-03-06 DIAGNOSIS — I132 Hypertensive heart and chronic kidney disease with heart failure and with stage 5 chronic kidney disease, or end stage renal disease: Secondary | ICD-10-CM | POA: Insufficient documentation

## 2021-03-06 DIAGNOSIS — M545 Low back pain, unspecified: Secondary | ICD-10-CM | POA: Diagnosis not present

## 2021-03-06 DIAGNOSIS — Z992 Dependence on renal dialysis: Secondary | ICD-10-CM | POA: Insufficient documentation

## 2021-03-06 DIAGNOSIS — R519 Headache, unspecified: Secondary | ICD-10-CM | POA: Diagnosis not present

## 2021-03-06 DIAGNOSIS — Z79899 Other long term (current) drug therapy: Secondary | ICD-10-CM | POA: Insufficient documentation

## 2021-03-06 LAB — CBC
HCT: 35.7 % — ABNORMAL LOW (ref 36.0–46.0)
Hemoglobin: 11.7 g/dL — ABNORMAL LOW (ref 12.0–15.0)
MCH: 32.5 pg (ref 26.0–34.0)
MCHC: 32.8 g/dL (ref 30.0–36.0)
MCV: 99.2 fL (ref 80.0–100.0)
Platelets: 332 10*3/uL (ref 150–400)
RBC: 3.6 MIL/uL — ABNORMAL LOW (ref 3.87–5.11)
RDW: 13.7 % (ref 11.5–15.5)
WBC: 7.9 10*3/uL (ref 4.0–10.5)
nRBC: 0 % (ref 0.0–0.2)

## 2021-03-06 LAB — BASIC METABOLIC PANEL
Anion gap: 15 (ref 5–15)
BUN: 48 mg/dL — ABNORMAL HIGH (ref 8–23)
CO2: 20 mmol/L — ABNORMAL LOW (ref 22–32)
Calcium: 8.8 mg/dL — ABNORMAL LOW (ref 8.9–10.3)
Chloride: 102 mmol/L (ref 98–111)
Creatinine, Ser: 5.53 mg/dL — ABNORMAL HIGH (ref 0.44–1.00)
GFR, Estimated: 8 mL/min — ABNORMAL LOW (ref >60)
Glucose, Bld: 104 mg/dL — ABNORMAL HIGH (ref 70–99)
Potassium: 4.2 mmol/L (ref 3.5–5.1)
Sodium: 137 mmol/L (ref 135–145)

## 2021-03-06 MED ORDER — OXYCODONE HCL 5 MG PO TABS
5.0000 mg | ORAL_TABLET | Freq: Three times a day (TID) | ORAL | 0 refills | Status: DC | PRN
Start: 2021-03-06 — End: 2021-03-21

## 2021-03-06 MED ORDER — CYCLOBENZAPRINE HCL 5 MG PO TABS: 5.0000 mg | ORAL_TABLET | Freq: Three times a day (TID) | ORAL | 0 refills | Status: DC | PRN

## 2021-03-06 MED ORDER — OXYCODONE HCL 5 MG PO TABS
5.0000 mg | ORAL_TABLET | Freq: Once | ORAL | Status: AC
  Administered 2021-03-06: 5 mg via ORAL
  Filled 2021-03-06: qty 1

## 2021-03-06 NOTE — ED Notes (Signed)
Sent rainbow to lab. 

## 2021-03-06 NOTE — ED Triage Notes (Signed)
Pt comes via EMs with c/o end stage renal disease and severe pain. Pt states she is hurting all over. Pt has been noncompliant with meds according to EMS. Pt has dialysis 3x week.

## 2021-03-06 NOTE — ED Provider Notes (Signed)
[facility] Emergency Department Provider Note    Event Date/Time   First MD Initiated Contact with Patient 03/06/21 1119     (approximate)  I have reviewed the triage vital signs and the nursing notes.   HISTORY  Chief Complaint Pain    HPI Katie [name] is a 65 y.o. female extensive past medical history as listed below presents to the ER for evaluation of pain all over.  Has had multiple visits for the same over the past year.  States that she gets a lot of pain related to dialysis.  States she is also having trouble caring for self at home.  She is currently staying with a friend who can no longer help her.  States she is having frequent falls.  She is complained of a headache as well as low back pain.  Does not remember striking her head or her back denies any new numbness or tingling.  Is requesting something for pain.  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)    Hypertension    Renal disorder    Type 2 diabetes mellitus ([place])    Family History  Problem Relation Age of Onset   Heart attack Mother    Asthma Mother    Uterine cancer Mother    Skin cancer Father    Prostate cancer Father    CAD Father    Parkinson's disease Father    Lung cancer Brother    Past Surgical History:  Procedure Laterality Date   APPENDECTOMY     DIALYSIS/PERMA CATHETER INSERTION N/A 05/16/2020   Procedure: DIALYSIS/PERMA CATHETER INSERTION;  Surgeon: Algernon Huxley, MD;  Location: [place] [facility];  Service: Cardiovascular;  Laterality: N/A;   MANDIBLE SURGERY     RENAL BIOPSY Right    RIGHT HEART CATH N/A 05/10/2020   Procedure: RIGHT HEART CATH;  Surgeon: Nelva Bush, MD;  Location: [place] [facility];  Service: Cardiovascular;  Laterality: N/A;   TEMPORARY DIALYSIS CATHETER N/A 05/11/2020   Procedure: TEMPORARY DIALYSIS CATHETER;  Surgeon: Algernon Huxley, MD;  Location: [place] [facility];  Service:  Cardiovascular;  Laterality: N/A;   Patient Active Problem List   Diagnosis Date Noted   ESRD on dialysis ([place]) 12/23/2020   Persistent headaches 06/27/2020   ATN (acute tubular necrosis) ([place])    Pulmonary hypertension, unspecified (HCC)    Chronic kidney disease (CKD), stage IV (severe) (HCC)    Pleural effusion    Acute heart failure with preserved ejection fraction (HFpEF) ([place])    Pressure injury of skin 05/01/2020   Hyperkalemia    Volume overload 04/30/2020   Type 2 diabetes mellitus with stage 5 chronic kidney disease ([place]) 04/30/2020   Hypertension    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[place])    Type 2 diabetes mellitus ([place])       Prior to Admission medications   Medication Sig Start Date End Date Taking? Authorizing Provider  oxyCODONE (ROXICODONE) 5 MG immediate release tablet Take 1 tablet (5 mg total) by mouth every 8 (eight) hours as needed. 03/06/21 03/06/22 Yes Merlyn Lot, MD  carvedilol (COREG) 25 MG tablet Take 1 tablet (25 mg total) by mouth 2 (two) times daily with a meal. 07/02/20 08/01/20  Nolberto Hanlon, MD  cetirizine (ZYRTEC) 10 MG tablet Take 10 mg by mouth daily.    [provider]  cyclobenzaprine (FLEXERIL) 5 MG tablet  Take 1 tablet (5 mg total) by mouth 3 (three) times daily as needed. 03/06/21   Merlyn Lot, MD  folic acid (FOLVITE) 1 MG tablet Take 1 mg by mouth daily.    [provider]  topiramate (TOPAMAX) 25 MG tablet Take 50 mg by mouth 2 (two) times daily. 07/26/20   [provider]  topiramate (TOPAMAX) 50 MG tablet Take 1 tablet (50 mg total) by mouth 2 (two) times daily. 07/02/20 08/01/20  Nolberto Hanlon, MD  torsemide (DEMADEX) 10 MG tablet Take 1 tablet (10 mg total) by mouth daily. 07/03/20 08/02/20  Nolberto Hanlon, MD  traZODone (DESYREL) 50 MG tablet Take 1 tablet (50 mg total) by mouth every evening. 07/02/20 08/01/20  Nolberto Hanlon, MD  venlafaxine (EFFEXOR) 37.5 MG tablet Take 1 tablet (37.5 mg total)  by mouth 2 (two) times daily with a meal. 07/02/20 08/01/20  Nolberto Hanlon, MD  verapamil (CALAN-SR) 240 MG CR tablet Take 1 tablet (240 mg total) by mouth at bedtime. 07/02/20 08/01/20  Nolberto Hanlon, MD    Allergies Penicillins    Social History Social History   Tobacco Use   Smoking status: Former   Smokeless tobacco: Never  Scientific laboratory technician Use: Never used  Substance Use Topics   Alcohol use: Not Currently    Review of Systems Patient denies headaches, rhinorrhea, blurry vision, numbness, shortness of breath, chest pain, edema, cough, abdominal pain, nausea, vomiting, diarrhea, dysuria, fevers, rashes or hallucinations unless otherwise stated above in HPI. ____________________________________________   PHYSICAL EXAM:  VITAL SIGNS: Vitals:   03/06/21 0951  BP: 121/89  Pulse: (!) 110  Resp: 19  Temp: 98.3 F (36.8 C)  SpO2: 94%    Constitutional: Alert and oriented.  Eyes: Conjunctivae are normal.  Head: Atraumatic. Nose: No congestion/rhinnorhea. Mouth/Throat: Mucous membranes are moist.   Neck: No stridor. Painless ROM.  Cardiovascular: Normal rate, regular rhythm. Grossly normal heart sounds.  Good peripheral circulation. Respiratory: Normal respiratory effort.  No retractions. Lungs CTAB. Gastrointestinal: Soft and nontender. No distention. No abdominal bruits. No CVA tenderness. Genitourinary:  Musculoskeletal: No lower extremity tenderness nor edema.  No joint effusions. Neurologic:  Normal speech and language. No gross focal neurologic deficits are appreciated. No facial droop Skin:  Skin is warm, dry and intact. No rash noted. Psychiatric: Mood and affect are normal. Speech and behavior are normal.  ____________________________________________   LABS (all labs ordered are listed, but only abnormal results are displayed)  Results for orders placed or performed during the hospital encounter of 03/06/21 (from the past 24 hour(s))  CBC     Status:  Abnormal   Collection Time: 03/06/21  9:40 AM  Result Value Ref Range   WBC 7.9 4.0 - 10.5 K/uL   RBC 3.60 (L) 3.87 - 5.11 MIL/uL   Hemoglobin 11.7 (L) 12.0 - 15.0 g/dL   HCT 35.7 (L) 36.0 - 46.0 %   MCV 99.2 80.0 - 100.0 fL   MCH 32.5 26.0 - 34.0 pg   MCHC 32.8 30.0 - 36.0 g/dL   RDW 13.7 11.5 - 15.5 %   Platelets 332 150 - 400 K/uL   nRBC 0.0 0.0 - 0.2 %  Basic metabolic panel     Status: Abnormal   Collection Time: 03/06/21  9:40 AM  Result Value Ref Range   Sodium 137 135 - 145 mmol/L   Potassium 4.2 3.5 - 5.1 mmol/L   Chloride 102 98 - 111 mmol/L   CO2 20 (L) 22 -  32 mmol/L   Glucose, Bld 104 (H) 70 - 99 mg/dL   BUN 48 (H) 8 - 23 mg/dL   Creatinine, Ser 5.53 (H) 0.44 - 1.00 mg/dL   Calcium 8.8 (L) 8.9 - 10.3 mg/dL   GFR, Estimated 8 (L) >60 mL/min   Anion gap 15 5 - 15   ____________________________________________ ____________________________________________  RADIOLOGY  I personally reviewed all radiographic images ordered to evaluate for the above acute complaints and reviewed radiology reports and findings.  These findings were personally discussed with the patient.  Please see medical record for radiology report.  ____________________________________________   PROCEDURES  Procedure(s) performed:  Procedures    Critical Care performed: no ____________________________________________   INITIAL IMPRESSION / ASSESSMENT AND PLAN / ED COURSE  Pertinent labs & imaging results that were available during my care of the patient were reviewed by me and considered in my medical decision making (see chart for details).   DDX: Electrolyte abnormality, dehydration, substance, fracture, contusion, SDH, IPH, noncompliance, chronic pain  Katie [name] is a 65 y.o. who presents to the ED with chronic pain as described above.  She is nontoxic-appearing hemodynamically stable.  Seems her primary reason for visit is persistent discomfort in muscle cramps that she blames on  dialysis.  Denies any shortness of breath.  She is denying any chest pain or pressure.  No fevers.  Was hoping to speak with someone about home health that she is concerned because she lives at her friend's house and says that her friend is tired of taking care of her.  She is able to ambulate.  She feels safe at home.  We will put in consult for possible home health.  Her blood work is otherwise reassuring.  Imaging also reassuring.  I do not see indication for hospitalization or further diagnostic work-up at this time.  Her pain is resolved.  She is stable and appropriate for outpatient follow-up.     The patient was evaluated in Emergency Department today for the symptoms described in the history of present illness. He/she was evaluated in the context of the global COVID-19 pandemic, which necessitated consideration that the patient might be at risk for infection with the SARS-CoV-2 virus that causes COVID-19. Institutional protocols and algorithms that pertain to the evaluation of patients at risk for COVID-19 are in a state of rapid change based on information released by regulatory bodies including the CDC and federal and state organizations. These policies and algorithms were followed during the patient's care in the ED.  As part of my medical decision making, I reviewed the following data within the [place] notes reviewed and incorporated, Labs reviewed, notes from prior ED visits and [place] Controlled Substance Database   ____________________________________________   FINAL CLINICAL IMPRESSION(S) / ED DIAGNOSES  Final diagnoses:  Chronic left-sided low back pain without sciatica      NEW MEDICATIONS STARTED DURING THIS VISIT:  New Prescriptions   OXYCODONE (ROXICODONE) 5 MG IMMEDIATE RELEASE TABLET    Take 1 tablet (5 mg total) by mouth every 8 (eight) hours as needed.     Note:  This document was prepared using Dragon voice recognition software and may  include unintentional dictation errors.    Merlyn Lot, MD 03/06/21 1416

## 2021-03-07 ENCOUNTER — Inpatient Hospital Stay
Admission: EM | Admit: 2021-03-07 | Discharge: 2021-03-21 | DRG: 264 | Disposition: A | Discharge status: Skilled Nursing Facility | Payer: Medicaid Other | Attending: Internal Medicine | Admitting: Internal Medicine

## 2021-03-07 ENCOUNTER — Observation Stay
Admit: 2021-03-07 | Discharge: 2021-03-07 | Disposition: A | Discharge status: Home / Self Care | Payer: Medicaid Other | Attending: Internal Medicine | Admitting: Internal Medicine

## 2021-03-07 ENCOUNTER — Emergency Department: Payer: Medicaid Other

## 2021-03-07 ENCOUNTER — Other Ambulatory Visit: Payer: Self-pay

## 2021-03-07 DIAGNOSIS — Z20822 Contact with and (suspected) exposure to covid-19: Secondary | ICD-10-CM | POA: Diagnosis present

## 2021-03-07 DIAGNOSIS — I953 Hypotension of hemodialysis: Secondary | ICD-10-CM | POA: Diagnosis present

## 2021-03-07 DIAGNOSIS — Z599 Problem related to housing and economic circumstances, unspecified: Secondary | ICD-10-CM

## 2021-03-07 DIAGNOSIS — R296 Repeated falls: Secondary | ICD-10-CM | POA: Diagnosis not present

## 2021-03-07 DIAGNOSIS — N186 End stage renal disease: Secondary | ICD-10-CM

## 2021-03-07 DIAGNOSIS — E1151 Type 2 diabetes mellitus with diabetic peripheral angiopathy without gangrene: Secondary | ICD-10-CM | POA: Diagnosis present

## 2021-03-07 DIAGNOSIS — R531 Weakness: Secondary | ICD-10-CM | POA: Diagnosis present

## 2021-03-07 DIAGNOSIS — Z751 Person awaiting admission to adequate facility elsewhere: Secondary | ICD-10-CM

## 2021-03-07 DIAGNOSIS — I951 Orthostatic hypotension: Secondary | ICD-10-CM | POA: Diagnosis present

## 2021-03-07 DIAGNOSIS — Z79899 Other long term (current) drug therapy: Secondary | ICD-10-CM

## 2021-03-07 DIAGNOSIS — R112 Nausea with vomiting, unspecified: Secondary | ICD-10-CM | POA: Diagnosis present

## 2021-03-07 DIAGNOSIS — Z880 Allergy status to penicillin: Secondary | ICD-10-CM

## 2021-03-07 DIAGNOSIS — Z992 Dependence on renal dialysis: Secondary | ICD-10-CM

## 2021-03-07 DIAGNOSIS — L89329 Pressure ulcer of left buttock, unspecified stage: Secondary | ICD-10-CM | POA: Diagnosis present

## 2021-03-07 DIAGNOSIS — Z87891 Personal history of nicotine dependence: Secondary | ICD-10-CM

## 2021-03-07 DIAGNOSIS — Z825 Family history of asthma and other chronic lower respiratory diseases: Secondary | ICD-10-CM

## 2021-03-07 DIAGNOSIS — I272 Pulmonary hypertension, unspecified: Secondary | ICD-10-CM | POA: Diagnosis present

## 2021-03-07 DIAGNOSIS — L89319 Pressure ulcer of right buttock, unspecified stage: Secondary | ICD-10-CM | POA: Diagnosis present

## 2021-03-07 DIAGNOSIS — Z5900 Homelessness unspecified: Secondary | ICD-10-CM | POA: Diagnosis not present

## 2021-03-07 DIAGNOSIS — Z8049 Family history of malignant neoplasm of other genital organs: Secondary | ICD-10-CM

## 2021-03-07 DIAGNOSIS — Z820 Family history of epilepsy and other diseases of the nervous system: Secondary | ICD-10-CM

## 2021-03-07 DIAGNOSIS — I10 Essential (primary) hypertension: Secondary | ICD-10-CM | POA: Diagnosis present

## 2021-03-07 DIAGNOSIS — R519 Headache, unspecified: Secondary | ICD-10-CM | POA: Diagnosis present

## 2021-03-07 DIAGNOSIS — J449 Chronic obstructive pulmonary disease, unspecified: Secondary | ICD-10-CM | POA: Diagnosis present

## 2021-03-07 DIAGNOSIS — I132 Hypertensive heart and chronic kidney disease with heart failure and with stage 5 chronic kidney disease, or end stage renal disease: Principal | ICD-10-CM | POA: Diagnosis present

## 2021-03-07 DIAGNOSIS — D631 Anemia in chronic kidney disease: Secondary | ICD-10-CM | POA: Diagnosis present

## 2021-03-07 DIAGNOSIS — Z8249 Family history of ischemic heart disease and other diseases of the circulatory system: Secondary | ICD-10-CM

## 2021-03-07 DIAGNOSIS — E119 Type 2 diabetes mellitus without complications: Secondary | ICD-10-CM

## 2021-03-07 DIAGNOSIS — N179 Acute kidney failure, unspecified: Secondary | ICD-10-CM | POA: Diagnosis present

## 2021-03-07 DIAGNOSIS — E1122 Type 2 diabetes mellitus with diabetic chronic kidney disease: Secondary | ICD-10-CM | POA: Diagnosis present

## 2021-03-07 DIAGNOSIS — Z801 Family history of malignant neoplasm of trachea, bronchus and lung: Secondary | ICD-10-CM

## 2021-03-07 DIAGNOSIS — I5032 Chronic diastolic (congestive) heart failure: Secondary | ICD-10-CM | POA: Diagnosis present

## 2021-03-07 DIAGNOSIS — G4700 Insomnia, unspecified: Secondary | ICD-10-CM | POA: Diagnosis present

## 2021-03-07 DIAGNOSIS — F32A Depression, unspecified: Secondary | ICD-10-CM | POA: Diagnosis present

## 2021-03-07 DIAGNOSIS — F419 Anxiety disorder, unspecified: Secondary | ICD-10-CM | POA: Diagnosis present

## 2021-03-07 DIAGNOSIS — K5900 Constipation, unspecified: Secondary | ICD-10-CM | POA: Diagnosis present

## 2021-03-07 DIAGNOSIS — Z808 Family history of malignant neoplasm of other organs or systems: Secondary | ICD-10-CM

## 2021-03-07 DIAGNOSIS — R42 Dizziness and giddiness: Secondary | ICD-10-CM | POA: Diagnosis present

## 2021-03-07 DIAGNOSIS — N184 Chronic kidney disease, stage 4 (severe): Secondary | ICD-10-CM | POA: Diagnosis present

## 2021-03-07 DIAGNOSIS — N2581 Secondary hyperparathyroidism of renal origin: Secondary | ICD-10-CM | POA: Diagnosis present

## 2021-03-07 DIAGNOSIS — R55 Syncope and collapse: Secondary | ICD-10-CM

## 2021-03-07 DIAGNOSIS — Z8042 Family history of malignant neoplasm of prostate: Secondary | ICD-10-CM

## 2021-03-07 LAB — HEPATIC FUNCTION PANEL
ALT: 20 U/L (ref 0–44)
AST: 22 U/L (ref 15–41)
Albumin: 3.6 g/dL (ref 3.5–5.0)
Alkaline Phosphatase: 94 U/L (ref 38–126)
Bilirubin, Direct: 0.1 mg/dL (ref 0.0–0.2)
Indirect Bilirubin: 0.8 mg/dL (ref 0.3–0.9)
Total Bilirubin: 0.9 mg/dL (ref 0.3–1.2)
Total Protein: 7.5 g/dL (ref 6.5–8.1)

## 2021-03-07 LAB — PROCALCITONIN: Procalcitonin: 0.56 ng/mL

## 2021-03-07 LAB — BASIC METABOLIC PANEL
Anion gap: 12 (ref 5–15)
BUN: 15 mg/dL (ref 8–23)
CO2: 26 mmol/L (ref 22–32)
Calcium: 8.7 mg/dL — ABNORMAL LOW (ref 8.9–10.3)
Chloride: 97 mmol/L — ABNORMAL LOW (ref 98–111)
Creatinine, Ser: 2.68 mg/dL — ABNORMAL HIGH (ref 0.44–1.00)
GFR, Estimated: 20 mL/min — ABNORMAL LOW (ref >60)
Glucose, Bld: 107 mg/dL — ABNORMAL HIGH (ref 70–99)
Potassium: 3.3 mmol/L — ABNORMAL LOW (ref 3.5–5.1)
Sodium: 135 mmol/L (ref 135–145)

## 2021-03-07 LAB — CBC
HCT: 36.2 % (ref 36.0–46.0)
Hemoglobin: 12.2 g/dL (ref 12.0–15.0)
MCH: 32.9 pg (ref 26.0–34.0)
MCHC: 33.7 g/dL (ref 30.0–36.0)
MCV: 97.6 fL (ref 80.0–100.0)
Platelets: 316 10*3/uL (ref 150–400)
RBC: 3.71 MIL/uL — ABNORMAL LOW (ref 3.87–5.11)
RDW: 13.7 % (ref 11.5–15.5)
WBC: 8.7 10*3/uL (ref 4.0–10.5)
nRBC: 0 % (ref 0.0–0.2)

## 2021-03-07 LAB — HEMOGLOBIN A1C
Hgb A1c MFr Bld: 5.1 % (ref 4.8–5.6)
Mean Plasma Glucose: 99.67 mg/dL

## 2021-03-07 LAB — TROPONIN I (HIGH SENSITIVITY)
Troponin I (High Sensitivity): 17 ng/L (ref <18)
Troponin I (High Sensitivity): 17 ng/L (ref <18)

## 2021-03-07 LAB — LACTIC ACID, PLASMA
Lactic Acid, Venous: 1.8 mmol/L (ref 0.5–1.9)
Lactic Acid, Venous: 1 mmol/L (ref 0.5–1.9)

## 2021-03-07 LAB — RESP PANEL BY RT-PCR (FLU A&B, COVID) ARPGX2
Influenza A by PCR: NEGATIVE
Influenza B by PCR: NEGATIVE
SARS Coronavirus 2 by RT PCR: NEGATIVE

## 2021-03-07 LAB — CBG MONITORING, ED
Glucose-Capillary: 162 mg/dL — ABNORMAL HIGH (ref 70–99)
Glucose-Capillary: 132 mg/dL — ABNORMAL HIGH (ref 70–99)

## 2021-03-07 LAB — VITAMIN B12: Vitamin B-12: 655 pg/mL (ref 180–914)

## 2021-03-07 LAB — VITAMIN D 25 HYDROXY (VIT D DEFICIENCY, FRACTURES): Vit D, 25-Hydroxy: 11.18 ng/mL — ABNORMAL LOW (ref 30–100)

## 2021-03-07 LAB — TSH: TSH: 0.549 u[IU]/mL (ref 0.350–4.500)

## 2021-03-07 LAB — BRAIN NATRIURETIC PEPTIDE: B Natriuretic Peptide: 83.8 pg/mL (ref 0.0–100.0)

## 2021-03-07 MED ORDER — INSULIN ASPART 100 UNIT/ML IJ SOLN
0.0000 [IU] | Freq: Three times a day (TID) | INTRAMUSCULAR | Status: DC
  Administered 2021-03-07: 1 [IU] via SUBCUTANEOUS
  Filled 2021-03-07 (×3): qty 1

## 2021-03-07 MED ORDER — TRAZODONE HCL 50 MG PO TABS
50.0000 mg | ORAL_TABLET | Freq: Every evening | ORAL | Status: DC
  Administered 2021-03-07 – 2021-03-13 (×7): 50 mg via ORAL
  Filled 2021-03-07 (×9): qty 1

## 2021-03-07 MED ORDER — HEPARIN SODIUM (PORCINE) 5000 UNIT/ML IJ SOLN
5000.0000 [IU] | Freq: Three times a day (TID) | INTRAMUSCULAR | Status: DC
  Administered 2021-03-07 – 2021-03-21 (×37): 5000 [IU] via SUBCUTANEOUS
  Filled 2021-03-07 (×38): qty 1

## 2021-03-07 MED ORDER — ACETAMINOPHEN 325 MG PO TABS
650.0000 mg | ORAL_TABLET | Freq: Four times a day (QID) | ORAL | Status: AC | PRN
  Administered 2021-03-10: 650 mg via ORAL
  Filled 2021-03-07 (×2): qty 2

## 2021-03-07 MED ORDER — SODIUM CHLORIDE 0.9 % IV BOLUS
500.0000 mL | Freq: Once | INTRAVENOUS | Status: AC
  Administered 2021-03-07: 500 mL via INTRAVENOUS

## 2021-03-07 MED ORDER — ACETAMINOPHEN 650 MG RE SUPP
650.0000 mg | Freq: Four times a day (QID) | RECTAL | Status: AC | PRN
  Filled 2021-03-07: qty 1

## 2021-03-07 MED ORDER — INSULIN ASPART 100 UNIT/ML IJ SOLN: 0.0000 [IU] | Freq: Every day | INTRAMUSCULAR | Status: DC

## 2021-03-07 MED ORDER — ONDANSETRON HCL 4 MG PO TABS
4.0000 mg | ORAL_TABLET | Freq: Four times a day (QID) | ORAL | Status: AC | PRN
  Administered 2021-03-11: 4 mg via ORAL
  Filled 2021-03-07: qty 1

## 2021-03-07 MED ORDER — OXYCODONE HCL 5 MG PO TABS
5.0000 mg | ORAL_TABLET | ORAL | Status: AC
  Administered 2021-03-07: 5 mg via ORAL
  Filled 2021-03-07: qty 1

## 2021-03-07 MED ORDER — TOPIRAMATE 25 MG PO TABS
50.0000 mg | ORAL_TABLET | Freq: Two times a day (BID) | ORAL | Status: DC | PRN
  Administered 2021-03-14 – 2021-03-17 (×2): 50 mg via ORAL
  Filled 2021-03-07 (×4): qty 2

## 2021-03-07 MED ORDER — LIDOCAINE 5 % EX PTCH
1.0000 | MEDICATED_PATCH | CUTANEOUS | Status: DC
  Administered 2021-03-07 – 2021-03-20 (×13): 1 via TRANSDERMAL
  Filled 2021-03-07 (×15): qty 1

## 2021-03-07 MED ORDER — ONDANSETRON HCL 4 MG/2ML IJ SOLN
4.0000 mg | Freq: Four times a day (QID) | INTRAMUSCULAR | Status: AC | PRN
Start: 2021-03-07 — End: 2021-03-13
  Administered 2021-03-09 – 2021-03-13 (×4): 4 mg via INTRAVENOUS
  Filled 2021-03-07 (×2): qty 2

## 2021-03-07 MED ORDER — VENLAFAXINE HCL 37.5 MG PO TABS
37.5000 mg | ORAL_TABLET | Freq: Two times a day (BID) | ORAL | Status: DC
  Administered 2021-03-07 – 2021-03-20 (×21): 37.5 mg via ORAL
  Filled 2021-03-07 (×31): qty 1

## 2021-03-07 MED ORDER — CARVEDILOL 12.5 MG PO TABS
12.5000 mg | ORAL_TABLET | Freq: Two times a day (BID) | ORAL | Status: DC
  Administered 2021-03-07 – 2021-03-13 (×12): 12.5 mg via ORAL
  Filled 2021-03-07 (×2): qty 1
  Filled 2021-03-07: qty 2
  Filled 2021-03-07 (×9): qty 1

## 2021-03-07 MED ORDER — ONDANSETRON HCL 4 MG/2ML IJ SOLN
4.0000 mg | Freq: Once | INTRAMUSCULAR | Status: AC
  Administered 2021-03-07: 4 mg via INTRAVENOUS
  Filled 2021-03-07: qty 2

## 2021-03-07 MED ORDER — OXYCODONE HCL 5 MG PO TABS
5.0000 mg | ORAL_TABLET | Freq: Four times a day (QID) | ORAL | Status: AC | PRN
  Administered 2021-03-07 – 2021-03-08 (×3): 5 mg via ORAL
  Filled 2021-03-07 (×3): qty 1

## 2021-03-07 NOTE — Evaluation (Signed)
Physical Therapy Evaluation Patient Details Name: Katie [name] MRN: [id number] DOB: 1956/05/12 Today's Date: 03/07/2021  History of Present Illness  Pt is a 65 y.o. female with medical history significant for hypertension, DM, end-stage renal disease on hemodialysis TuThSa, heart failure preserved ejection fraction, chronic low back pain, presents emergency department for chief concerns of frequent falls and syncope after dialysis. Pt struggles with homelessness and finanacial difficulty. MD assessment includes: increased falls and weakness, depression/anxiety, insominia, and HAs.   Clinical Impression  Pt was pleasant and motivated to participate during the session but ultimately presented with significant functional weakness and required physical assist with bed mobility tasks to come to sitting.  Pt was able to stand from a significantly elevated height without physical assist but struggled to take several small steps at the EOB.  Pt reported some dizziness in standing and upon returning to sitting seated BP was taken at 92/56 compared to supine BP of 117/69, nsg notified.  Pt has a history of recent falls and is currently at an elevated fall risk.  Pt will benefit from PT services in a SNF setting upon discharge to safely address deficits listed in patient problem list for decreased caregiver assistance and eventual return to PLOF.         Recommendations for follow up therapy are one component of a multi-disciplinary discharge planning process, led by the attending physician.  Recommendations may be updated based on patient status, additional functional criteria and insurance authorization.  Follow Up Recommendations Skilled nursing-short term rehab (<3 hours/day)    Assistance Recommended at Discharge Intermittent Supervision/Assistance  Functional Status Assessment Patient has had a recent decline in their functional status and demonstrates the ability to make significant improvements in  function in a reasonable and predictable amount of time.  Equipment Recommendations  None recommended by PT    Recommendations for Other Services       Precautions / Restrictions Precautions Precautions: Fall Restrictions Weight Bearing Restrictions: No Other Position/Activity Restrictions: Watch orthostatics      Mobility  Bed Mobility Overal bed mobility: Needs Assistance Bed Mobility: Sidelying to Sit   Sidelying to sit: Min assist;Mod assist       General bed mobility comments: Log roll training to address LBP and functional weakness; min to mod assist for BLE and trunk control    Transfers Overall transfer level: Needs assistance Equipment used: Rolling walker (2 wheels) Transfers: Sit to/from Stand Sit to Stand: Min guard;From elevated surface           General transfer comment: Good control and stability from elevated surface    Ambulation/Gait Ambulation/Gait assistance: Min guard Gait Distance (Feet): 1 Feet Assistive device: Rolling walker (2 wheels) Gait Pattern/deviations: Step-to pattern;Shuffle;Decreased step length - right;Decreased step length - left;Trunk flexed Gait velocity: decreased   General Gait Details: Pt only able to take a max of 2-3 very small shuffling steps at the EOB before needing to return to sitting secondary to weakness and dizziness  Stairs            Wheelchair Mobility    Modified Rankin (Stroke Patients Only)       Balance Overall balance assessment: Needs assistance   Sitting balance-Leahy Scale: Good     Standing balance support: Bilateral upper extremity supported;During functional activity Standing balance-Leahy Scale: Fair                               Pertinent Vitals/Pain  Pain Assessment: 0-10 Pain Score: 7  Pain Location: HA and chronic back pain Pain Descriptors / Indicators: Headache;Aching;Sore Pain Intervention(s): Premedicated before session;Monitored during session     [place] expects to be discharged to:: Unsure                   Additional Comments: Pt had been living with friend but will not be able to return to prior living situation after discharge; pt stated that friends, family, etc were unable to assist and has no financial means for housing    Prior Function Prior Level of Function : Needs assist       Physical Assist : Mobility (physical);ADLs (physical) Mobility (physical): Transfers;Gait ADLs (physical): Bathing;Dressing Mobility Comments: Modified independent with ambulation with rollator limited community distances, 2-3 falls per week due to syncope and/or dizziness ADLs Comments: Friend assisted with bathing and dressing as needed     Hand Dominance        Extremity/Trunk Assessment   Upper Extremity Assessment Upper Extremity Assessment: Generalized weakness    Lower Extremity Assessment Lower Extremity Assessment: Generalized weakness       Communication   Communication: No difficulties  Cognition Arousal/Alertness: Awake/alert Behavior During Therapy: WFL for tasks assessed/performed Overall Cognitive Status: Within Functional Limits for tasks assessed                                          General Comments      Exercises Total Joint Exercises Long Arc Quad: AROM;Strengthening;Both;10 reps Knee Flexion: AROM;Both;10 reps;Strengthening Other Exercises Other Exercises: HEP education for BLE APs, QS, and GS x 10 each 3-4x/day Other Exercises: Log roll training for decreased caregiver assistance and back pain relief   Assessment/Plan    PT Assessment Patient needs continued PT services  PT Problem List Decreased strength;Decreased activity tolerance;Decreased balance;Decreased mobility;Decreased knowledge of use of DME;Pain       PT Treatment Interventions DME instruction;Gait training;Functional mobility training;Therapeutic activities;Therapeutic  exercise;Balance training;Patient/family education    PT Goals (Current goals can be found in the Care Plan section)  Acute Rehab PT Goals Patient Stated Goal: Improved strength and balance and return to independence PT Goal Formulation: With patient Time For Goal Achievement: 03/20/21 Potential to Achieve Goals: Good    Frequency Min 2X/week   Barriers to discharge Inaccessible home environment;Decreased caregiver support      Co-evaluation               AM-PAC PT "6 Clicks" Mobility  Outcome Measure Help needed turning from your back to your side while in a flat bed without using bedrails?: A Little Help needed moving from lying on your back to sitting on the side of a flat bed without using bedrails?: A Lot Help needed moving to and from a bed to a chair (including a wheelchair)?: A Lot Help needed standing up from a chair using your arms (e.g., wheelchair or bedside chair)?: A Little Help needed to walk in hospital room?: A Lot Help needed climbing 3-5 steps with a railing? : Total 6 Click Score: 13    End of Session Equipment Utilized During Treatment: Gait belt Activity Tolerance: Other (comment) (limited by weakness and dizziness) Patient left: in bed;with call bell/phone within reach;Other (comment) (bilateral rails up as found in ED) Nurse Communication: Mobility status;Other (comment) (Orthostatic BP per above) PT Visit Diagnosis: Unsteadiness on feet (R26.81);History  of falling (Z91.81);Muscle weakness (generalized) (M62.81);Difficulty in walking, not elsewhere classified (R26.2);Pain Pain - part of body:  (chronic back pain)    Time: 1631-1700 PT Time Calculation (min) (ACUTE ONLY): 29 min   Charges:   PT Evaluation $PT Eval Moderate Complexity: 1 Mod PT Treatments $Therapeutic Activity: 8-22 mins        D. Royetta Asal PT, DPT 03/07/21, 5:34 PM

## 2021-03-07 NOTE — H&P (Signed)
History and Physical   [name] DVV:[id number] DOB: 03/08/1956 DOA: 03/07/2021  PCP: Patient, [name] (Inactive)  Outpatient Specialists: Dr. Holley Raring, nephrology Patient coming from: MedSurg, observation, telemetry  I have personally briefly reviewed patient's old medical records in [place].  Chief Concern: Weakness   HPI: Katie [name] is a 65 y.o. female with medical history significant for hypertension, end-stage renal disease on hemodialysis [facility], heart failure preserved ejection fraction, chronic low back pain, presents emergency department for chief concerns of frequent falls and syncope after dialysis.  She reports that she has had episodes of syncope after hemodialysis. She started HD in December 2021.  She reports that every time she has dialysis, she becomes very short of breath and weak and has increased chest pain.  She also feels very dizzy.  She thinks that she may have had loss of conscious however she does not remember.  She denies any recent fever, diarrhea.  She did endorse nausea and one episode of vomiting.  She reports that she has not had a lot to eat recently.  She reports that she does not have children.  She has never been married.  She also reports that she no longer has a place to stay.  Social history: She lives with her friend, who states she can't live there anymore. She is a former tobacco user. At her peak, 1.5 ppd. She denies etoh and recreational drug use. She is disabled and formerly worked as Optician, dispensing.   Vaccination history: She is vaccinated for covid 19, 3 or 4 doses of Moderna.   ROS: Constitutional: no weight change, no fever ENT/Mouth: no sore throat, no rhinorrhea Eyes: no eye pain, no vision changes Cardiovascular: no chest pain, + dyspnea,  no edema, no palpitations Respiratory: no cough, no sputum, no wheezing Gastrointestinal: no nausea, no vomiting, no diarrhea, no constipation Genitourinary: no urinary  incontinence, no dysuria, no hematuria Musculoskeletal: no arthralgias, no myalgias Skin: no skin lesions, no pruritus, Neuro: + weakness, + loss of consciousness, + syncope Psych: no anxiety, no depression, + decrease appetite Heme/Lymph: no bruising, no bleeding  ED Course: Discussed with emergency medicine provider, patient requiring hospitalization for chief concerns are frequent falls and syncope.  Vitals in the emergency department was remarkable for temperature of 97.7, respiration rate of initially 25 and improved to 18, heart rate of 101, blood pressure 132/79, SPO2 of 94% on room air.  Labs in the ED showed sodium 135, potassium 3.3, chloride 97, bicarb 26, BUN of 15, serum creatinine of 2.68, nonfasting blood glucose 107, GFR 20, WBC 8.7, hemoglobin 12.2, platelets 316.  BNP was 83.8.  High sensitive troponin was 17.  Lactic acid was 1.8.    COVID/influenza A/influenza B PCR were pending.  In the emergency department patient was given oxycodone 5 mg, ondansetron 4 mg IV, lidocaine patch. Sodium chloride 500 L bolus.  Assessment/Plan  Active Problems:   Hypertension   Type 2 diabetes mellitus (HCC)   Chronic kidney disease (CKD), stage IV (severe) (HCC)   Pulmonary hypertension, unspecified ([place])   ESRD on dialysis ([place])   Frequent falls   Homelessness   Financial difficulty   # Increased fall and weakness-etiology work-up in progress - I suspect this is secondary to post dialysis treatments - However inpatient setting of frequent weakness and homelessness, I will check for B12, B1, vitamin D, procalcitonin - Complete echo has been ordered to assess for valvular and/or arrhythmia abnormalities - TOC has been consulted  for SNF, PT has been consulted - Fall precaution - MedSurg, observation, telemetry  # End-stage renal disease on hemodialysis - Patient completed hemodialysis session on 03/07/2021 - Routine consultation to nephrology has been placed via secure chat and  via epic order  # Hypertension-patient takes carvedilol 25 mg p.o. twice daily, torsemide 10 mg daily, verapamil to 40 mg nightly - Patient is not taking any of these medications - Carvedilol resumed at 12.5 mg p.o. twice daily  # Depression/anxiety-venlafaxine 37.5 mg p.o. twice daily resumed  # Insomnia-trazodone 50 mg nightly resumed  # Headaches-topiramate 50 mg p.o. twice daily as needed for headaches  # History of diabetes type 2-presumed improved with end-stage renal disease - Insulin SSI with at bedtime coverage ordered - Goal blood sugar while inpatient is 140-180  Chart reviewed.   Echo on 05/01/2020 showed EF of 55 to 02%, grade 2 diastolic dysfunction.  DVT prophylaxis: Heparin 5000 units subcutaneous every 8 hours Code Status: full code   Diet: Renal Family Communication: No Disposition Plan: Pending clinical course Consults called: Nephrology, TOC, PT Admission status: MedSurg, observation, telemetry ordered  Past Medical History:  Diagnosis Date   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[place])    Hypertension    Renal disorder    Type 2 diabetes mellitus ([place])    Past Surgical History:  Procedure Laterality Date   APPENDECTOMY     DIALYSIS/PERMA CATHETER INSERTION N/A 05/16/2020   Procedure: DIALYSIS/PERMA CATHETER INSERTION;  Surgeon: Algernon Huxley, MD;  Location: [facility];  Service: Cardiovascular;  Laterality: N/A;   MANDIBLE SURGERY     RENAL BIOPSY Right    RIGHT HEART CATH N/A 05/10/2020   Procedure: RIGHT HEART CATH;  Surgeon: Nelva Bush, MD;  Location: [facility];  Service: Cardiovascular;  Laterality: N/A;   TEMPORARY DIALYSIS CATHETER N/A 05/11/2020   Procedure: TEMPORARY DIALYSIS CATHETER;  Surgeon: Algernon Huxley, MD;  Location: [facility];  Service: Cardiovascular;  Laterality: N/A;   Social History:  reports that she has quit smoking. She has never used smokeless tobacco. She reports that she does not  currently use alcohol. No history on file for drug use.  Allergies  Allergen Reactions   Penicillins Nausea And Vomiting    Pt states "only pills" make her vomit   Family History  Problem Relation Age of Onset   Heart attack Mother    Asthma Mother    Uterine cancer Mother    Skin cancer Father    Prostate cancer Father    CAD Father    Parkinson's disease Father    Lung cancer Brother    Family history: Family history reviewed and not pertinent  Prior to Admission medications   Medication Sig Start Date End Date Taking? Authorizing Provider  carvedilol (COREG) 25 MG tablet Take 1 tablet (25 mg total) by mouth 2 (two) times daily with a meal. 07/02/20 08/01/20  Nolberto Hanlon, MD  cetirizine (ZYRTEC) 10 MG tablet Take 10 mg by mouth daily.    [provider]  cyclobenzaprine (FLEXERIL) 5 MG tablet Take 1 tablet (5 mg total) by mouth 3 (three) times daily as needed. 03/06/21   Merlyn Lot, MD  folic acid (FOLVITE) 1 MG tablet Take 1 mg by mouth daily.    [provider]  oxyCODONE (ROXICODONE) 5 MG immediate release tablet Take 1 tablet (5 mg total) by mouth every 8 (eight) hours as needed. 03/06/21 03/06/22  Merlyn Lot,  MD  topiramate (TOPAMAX) 25 MG tablet Take 50 mg by mouth 2 (two) times daily. 07/26/20   [provider]  topiramate (TOPAMAX) 50 MG tablet Take 1 tablet (50 mg total) by mouth 2 (two) times daily. 07/02/20 08/01/20  Nolberto Hanlon, MD  torsemide (DEMADEX) 10 MG tablet Take 1 tablet (10 mg total) by mouth daily. 07/03/20 08/02/20  Nolberto Hanlon, MD  traZODone (DESYREL) 50 MG tablet Take 1 tablet (50 mg total) by mouth every evening. 07/02/20 08/01/20  Nolberto Hanlon, MD  venlafaxine (EFFEXOR) 37.5 MG tablet Take 1 tablet (37.5 mg total) by mouth 2 (two) times daily with a meal. 07/02/20 08/01/20  Nolberto Hanlon, MD  verapamil (CALAN-SR) 240 MG CR tablet Take 1 tablet (240 mg total) by mouth at bedtime. 07/02/20 08/01/20  Nolberto Hanlon, MD   Physical  Exam: Vitals:   03/07/21 1113 03/07/21 1150 03/07/21 1320  BP: (!) 89/54 132/79 (!) 155/88  Pulse: 93 (!) 101 (!) 102  Resp: 18 (!) 25 20  Temp: 97.7 F (36.5 C)    TempSrc: Oral    SpO2: 99% 94% 91%   Constitutional: appears older than chronological age  , NAD, calm, comfortable Eyes: PERRL, lids and conjunctivae normal ENMT: Mucous membranes are moist. Posterior pharynx clear of any exudate or lesions. Age-appropriate dentition. Hearing appropriate Neck: normal, supple, no masses, no thyromegaly Respiratory: clear to auscultation bilaterally, no wheezing, no crackles. Normal respiratory effort. No accessory muscle use.  Cardiovascular: Regular rate and rhythm, no murmurs / rubs / gallops. No extremity edema. 2+ pedal pulses. No carotid bruits.  Abdomen: Obese abdomen, no tenderness, no masses palpated, no hepatosplenomegaly. Bowel sounds positive.  Musculoskeletal: no clubbing / cyanosis. No joint deformity upper and lower extremities. Good ROM, no contractures, no atrophy. Normal muscle tone.  Skin: no rashes, lesions, ulcers. No induration Neurologic: Sensation intact. Strength 5/5 in all 4.  Psychiatric: Normal judgment and insight. Alert and oriented x 3. Normal mood.   EKG: independently reviewed, showing sinus rhythm with rate of 96, QTc 492  Chest x-ray on Admission: I personally reviewed and I agree with radiologist reading as below.  DG Chest 2 View  Result Date: 03/07/2021 CLINICAL DATA:  Chest pain EXAM: CHEST - 2 VIEW COMPARISON:  Chest x-ray 03/06/2021 FINDINGS: Heart is enlarged. Mediastinum appears stable. Calcified plaques in the aortic arch. Pulmonary vasculature is normal. Right-sided central line is stable. Lungs are hyperinflated with chronic prominent interstitial lung markings/scarring mostly in the lower lung zones. Trace bilateral pleural effusions. No pneumothorax. IMPRESSION: 1. No acute process identified. 2. Cardiomegaly. 3. Chronic changes in the lower  lung zones. Electronically Signed   By: Ofilia Neas   On: 03/07/2021 12:48   DG Chest 2 View  Result Date: 03/06/2021 CLINICAL DATA:  Cough.  End stage kidney disease.  Pain EXAM: CHEST - 2 VIEW COMPARISON:  02/02/2021 FINDINGS: There is a right chest wall dialysis catheter with tips in the cavoatrial junction. Heart size normal. Aortic atherosclerotic calcifications. Blunting of the costophrenic angles are noted bilaterally. Similar appearance of mid and lower lung zone scarring. No signs of interstitial edema or airspace consolidation. Visualized osseous structures are unremarkable. IMPRESSION: 1. Small pleural effusions 2. Bilateral lower lung zone predominant pulmonary parenchymal scarring. Electronically Signed   By: Kerby Moors M.D.   On: 03/06/2021 12:54   DG Lumbar Spine 2-3 Views  Result Date: 03/06/2021 CLINICAL DATA:  Back pain EXAM: LUMBAR SPINE - 2-3 VIEW COMPARISON:  CT abdomen pelvis, 12/12/2020 FINDINGS:  No fracture or dislocation of the lumbar spine. Mild multilevel disc space height loss and osteophytosis. Mild multilevel facet degenerative change. Nonobstructive pattern of overlying bowel gas. IMPRESSION: No fracture or dislocation of the lumbar spine. Mild multilevel disc space height loss and osteophytosis. Mild multilevel facet degenerative change. Electronically Signed   By: Delanna Ahmadi M.D.   On: 03/06/2021 12:53   CT HEAD WO CONTRAST (5MM)  Result Date: 03/07/2021 CLINICAL DATA:  Fall, near syncope EXAM: CT HEAD WITHOUT CONTRAST CT CERVICAL SPINE WITHOUT CONTRAST TECHNIQUE: Multidetector CT imaging of the head and cervical spine was performed following the standard protocol without intravenous contrast. Multiplanar CT image reconstructions of the cervical spine were also generated. COMPARISON:  None. FINDINGS: CT HEAD FINDINGS Brain: No evidence of acute infarction, hemorrhage, hydrocephalus, extra-axial collection or mass lesion/mass effect. Mild periventricular  white matter hypodensity. 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 Vascular: No hyperdense vessel or unexpected calcification. Skull: Normal. Negative for fracture or focal lesion. Sinuses/Orbits: No acute finding. Other: None. CT CERVICAL SPINE FINDINGS Alignment: Normal. Skull base and vertebrae: No acute fracture. No primary bone lesion or focal pathologic process. Soft tissues and spinal canal: No prevertebral fluid or swelling. No visible canal hematoma. Disc levels: Focally mild disc space height loss and osteophytosis of C5-C6, with otherwise preserved disc spaces. Upper chest: Negative. Other: None. IMPRESSION: 1. No acute intracranial pathology. Small-vessel white matter disease. 2. No fracture or static subluxation of the cervical spine. Electronically Signed   By: Delanna Ahmadi M.D.   On: 03/07/2021 12:42   CT HEAD WO CONTRAST (5MM)  Result Date: 03/06/2021 CLINICAL DATA:  Severe headache and back spasms. EXAM: CT HEAD WITHOUT CONTRAST TECHNIQUE: Contiguous axial images were obtained from the base of the skull through the vertex without intravenous contrast. COMPARISON:  December 15, 2020 FINDINGS: Brain: No evidence of acute infarction, hemorrhage, hydrocephalus, extra-axial collection or mass lesion/mass effect. Vascular: No hyperdense vessel. Atherosclerotic calcifications of the internal carotid and vertebral arteries at the skull base. Skull: Normal. Negative for fracture or focal lesion. Sinuses/Orbits: Visualized portions of the paranasal sinuses and mastoid air cells are predominantly clear. Orbits are unremarkable. Other: None. IMPRESSION: No acute intracranial abnormality. Electronically Signed   By: Dahlia Bailiff M.D.   On: 03/06/2021 12:29   CT Cervical Spine Wo Contrast  Result Date: 03/07/2021 CLINICAL DATA:  Fall, near syncope EXAM: CT HEAD WITHOUT CONTRAST CT CERVICAL SPINE WITHOUT CONTRAST TECHNIQUE: Multidetector CT imaging of the head and cervical  spine was performed following the standard protocol without intravenous contrast. Multiplanar CT image reconstructions of the cervical spine were also generated. COMPARISON:  None. FINDINGS: CT HEAD FINDINGS Brain: No evidence of acute infarction, hemorrhage, hydrocephalus, extra-axial collection or mass lesion/mass effect. Mild periventricular white matter hypodensity. 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 Vascular: No hyperdense vessel or unexpected calcification. Skull: Normal. Negative for fracture or focal lesion. Sinuses/Orbits: No acute finding. Other: None. CT CERVICAL SPINE FINDINGS Alignment: Normal. Skull base and vertebrae: No acute fracture. No primary bone lesion or focal pathologic process. Soft tissues and spinal canal: No prevertebral fluid or swelling. No visible canal hematoma. Disc levels: Focally mild disc space height loss and osteophytosis of C5-C6, with otherwise preserved disc spaces. Upper chest: Negative. Other: None. IMPRESSION: 1. No acute intracranial pathology. Small-vessel white matter disease. 2. No fracture or static subluxation of the cervical spine. Electronically Signed   By: Delanna Ahmadi M.D.   On:  03/07/2021 12:42    Labs on Admission: I have personally reviewed following labs  CBC: Recent Labs  Lab 03/06/21 0940 03/07/21 1114  WBC 7.9 8.7  HGB 11.7* 12.2  HCT 35.7* 36.2  MCV 99.2 97.6  PLT 332 356   Basic Metabolic Panel: Recent Labs  Lab 03/06/21 0940 03/07/21 1114  NA 137 135  K 4.2 3.3*  CL 102 97*  CO2 20* 26  GLUCOSE 104* 107*  BUN 48* 15  CREATININE 5.53* 2.68*  CALCIUM 8.8* 8.7*   GFR: CrCl cannot be calculated (Unknown ideal weight.).  Liver Function Tests: Recent Labs  Lab 03/07/21 1114  AST 22  ALT 20  ALKPHOS 94  BILITOT 0.9  PROT 7.5  ALBUMIN 3.6   Urine analysis:    Component Value Date/Time   COLORURINE YELLOW (A) 05/03/2020 1158   APPEARANCEUR CLEAR (A) 05/03/2020 1158   LABSPEC  1.011 05/03/2020 1158   PHURINE 5.0 05/03/2020 1158   GLUCOSEU 50 (A) 05/03/2020 1158   HGBUR SMALL (A) 05/03/2020 1158   BILIRUBINUR NEGATIVE 05/03/2020 1158   KETONESUR NEGATIVE 05/03/2020 1158   PROTEINUR >=300 (A) 05/03/2020 1158   NITRITE NEGATIVE 05/03/2020 1158   LEUKOCYTESUR NEGATIVE 05/03/2020 1158   Dr. Tobie Poet [facility]  If 7PM-7AM, please contact overnight-coverage provider If 7AM-7PM, please contact day coverage provider [email or web address]  03/07/2021, 1:46 PM

## 2021-03-07 NOTE — ED Provider Notes (Signed)
[facility] Emergency Department Provider Note  ____________________________________________   Event Date/Time   First MD Initiated Contact with Patient 03/07/21 1142     (approximate)  I have reviewed the triage vital signs and the nursing notes.   HISTORY  Chief Complaint No chief complaint on file.   HPI Katie [name] is a 65 y.o. female with past medical history of HTN, COPD, DM, ESRD on HD Tuesday Thursday Saturday who presents for assessment from dialysis via EMS with several complaints.  Patient states she syncopized shortly after she finished dialysis while sitting in a chair.  She does not think she fell out of her chair she is not sure if she hit her head against anything well passing out.  She has mild headache states she also has some mild left-sided chest discomfort.  He states her lower back is quite painful more on the left side than anywhere else in the same spot she has had back pain going back at least several weeks.  She endorses multiple recent falls but none last 24 hours and does not think she hit her head during these falls.  She states she typically uses a walker to get around.  She denies any vomiting, diarrhea, fevers, cough, rash vision changes, vertigo, earache, sore throat or other acute sick symptoms.  States she is working on getting to find a place to live she is currently staying with a friend and she cannot stay with his friend much longer.  She notes she was seen emergency room yesterday for left lower back pain and pain in her chest and was given a prescription for oxycodone but she has not taken it this morning and would very much a dose of soon as I am able to give her some.  She states she has been taking Tylenol without any relief approximately 3 to 4 hours prior to arrival.         Past Medical History:  Diagnosis Date   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[place])    Hypertension    Renal disorder    Type 2 diabetes  mellitus ([place])     Patient Active Problem List   Diagnosis Date Noted   Frequent falls 03/07/2021   Homelessness 03/07/2021   ESRD on dialysis ([place]) 12/23/2020   Persistent headaches 06/27/2020   ATN (acute tubular necrosis) ([place])    Pulmonary hypertension, unspecified (HCC)    Chronic kidney disease (CKD), stage IV (severe) (HCC)    Pleural effusion    Acute heart failure with preserved ejection fraction (HFpEF) ([place])    Pressure injury of skin 05/01/2020   Hyperkalemia    Volume overload 04/30/2020   Type 2 diabetes mellitus with stage 5 chronic kidney disease ([place]) 04/30/2020   Hypertension    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[place])    Type 2 diabetes mellitus ([place])     Past Surgical History:  Procedure Laterality Date   APPENDECTOMY     DIALYSIS/PERMA CATHETER INSERTION N/A 05/16/2020   Procedure: DIALYSIS/PERMA CATHETER INSERTION;  Surgeon: Algernon Huxley, MD;  Location: [place] [facility];  Service: Cardiovascular;  Laterality: N/A;   MANDIBLE SURGERY     RENAL BIOPSY Right    RIGHT HEART CATH N/A 05/10/2020   Procedure: RIGHT HEART CATH;  Surgeon: Nelva Bush, MD;  Location: [place] [facility];  Service: Cardiovascular;  Laterality: N/A;   TEMPORARY DIALYSIS CATHETER N/A 05/11/2020   Procedure: TEMPORARY  DIALYSIS CATHETER;  Surgeon: Algernon Huxley, MD;  Location: [place] [facility];  Service: Cardiovascular;  Laterality: N/A;    Prior to Admission medications   Medication Sig Start Date End Date Taking? Authorizing Provider  carvedilol (COREG) 25 MG tablet Take 1 tablet (25 mg total) by mouth 2 (two) times daily with a meal. 07/02/20 08/01/20  Nolberto Hanlon, MD  cetirizine (ZYRTEC) 10 MG tablet Take 10 mg by mouth daily.    [provider]  cyclobenzaprine (FLEXERIL) 5 MG tablet Take 1 tablet (5 mg total) by mouth 3 (three) times daily as needed. 03/06/21   Merlyn Lot, MD  folic acid (FOLVITE) 1 MG tablet Take 1 mg by mouth daily.     [provider]  oxyCODONE (ROXICODONE) 5 MG immediate release tablet Take 1 tablet (5 mg total) by mouth every 8 (eight) hours as needed. 03/06/21 03/06/22  Merlyn Lot, MD  topiramate (TOPAMAX) 25 MG tablet Take 50 mg by mouth 2 (two) times daily. 07/26/20   [provider]  topiramate (TOPAMAX) 50 MG tablet Take 1 tablet (50 mg total) by mouth 2 (two) times daily. 07/02/20 08/01/20  Nolberto Hanlon, MD  torsemide (DEMADEX) 10 MG tablet Take 1 tablet (10 mg total) by mouth daily. 07/03/20 08/02/20  Nolberto Hanlon, MD  traZODone (DESYREL) 50 MG tablet Take 1 tablet (50 mg total) by mouth every evening. 07/02/20 08/01/20  Nolberto Hanlon, MD  venlafaxine (EFFEXOR) 37.5 MG tablet Take 1 tablet (37.5 mg total) by mouth 2 (two) times daily with a meal. 07/02/20 08/01/20  Nolberto Hanlon, MD  verapamil (CALAN-SR) 240 MG CR tablet Take 1 tablet (240 mg total) by mouth at bedtime. 07/02/20 08/01/20  Nolberto Hanlon, MD    Allergies Penicillins  Family History  Problem Relation Age of Onset   Heart attack Mother    Asthma Mother    Uterine cancer Mother    Skin cancer Father    Prostate cancer Father    CAD Father    Parkinson's disease Father    Lung cancer Brother     Social History Social History   Tobacco Use   Smoking status: Former   Smokeless tobacco: Never  Scientific laboratory technician Use: Never used  Substance Use Topics   Alcohol use: Not Currently    Review of Systems  Review of Systems  Constitutional:  Negative for chills and fever.  HENT:  Negative for sore throat.   Eyes:  Negative for pain.  Respiratory:  Negative for cough and stridor.   Cardiovascular:  Positive for chest pain.  Gastrointestinal:  Positive for nausea and vomiting.  Genitourinary:  Negative for dysuria.  Musculoskeletal:  Positive for back pain, falls and neck pain.  Skin:  Negative for rash.  Neurological:  Positive for dizziness, loss of consciousness and headaches. Negative for seizures.   Psychiatric/Behavioral:  Negative for suicidal ideas.   All other systems reviewed and are negative.    ____________________________________________   PHYSICAL EXAM:  VITAL SIGNS: ED Triage Vitals [03/07/21 1113]  Enc Vitals Group     BP (!) 89/54     Pulse Rate 93     Resp 18     Temp 97.7 F (36.5 C)     Temp Source Oral     SpO2 99 %     Weight      Height      Head Circumference      Peak Flow      Pain Score  8     Pain Loc      Pain Edu?      Excl. in [place]?    Vitals:   03/07/21 1113 03/07/21 1150  BP: (!) 89/54 132/79  Pulse: 93 (!) 101  Resp: 18 (!) 25  Temp: 97.7 F (36.5 C)   SpO2: 99% 94%   Physical Exam Vitals and nursing note reviewed.  Constitutional:      General: She is not in acute distress.    Appearance: She is well-developed. She is ill-appearing.  HENT:     Head: Normocephalic and atraumatic.     Right Ear: External ear normal.     Left Ear: External ear normal.     Nose: Nose normal.  Eyes:     Conjunctiva/sclera: Conjunctivae normal.  Cardiovascular:     Rate and Rhythm: Normal rate and regular rhythm.     Heart sounds: No murmur heard. Pulmonary:     Effort: Pulmonary effort is normal. No respiratory distress.     Breath sounds: Normal breath sounds.  Abdominal:     Palpations: Abdomen is soft.     Tenderness: There is no abdominal tenderness.  Musculoskeletal:     Cervical back: Neck supple.  Skin:    General: Skin is warm and dry.     Capillary Refill: Capillary refill takes 2 to 3 seconds.  Neurological:     Mental Status: She is alert and oriented to person, place, and time.  Psychiatric:        Mood and Affect: Mood normal.    Some mild tenderness throughout the C/T/L-spine but particularly on the left parathoracic and lumbar muscles seem to be spasming..  Patient has no finger dysmetria or pronator drift.  Cranial nerves II through XII are grossly intact. ____________________________________________   LABS (all labs  ordered are listed, but only abnormal results are displayed)  Labs Reviewed  BASIC METABOLIC PANEL - Abnormal; Notable for the following components:      Result Value   Potassium 3.3 (*)    Chloride 97 (*)    Glucose, Bld 107 (*)    Creatinine, Ser 2.68 (*)    Calcium 8.7 (*)    GFR, Estimated 20 (*)    All other components within normal limits  CBC - Abnormal; Notable for the following components:   RBC 3.71 (*)    All other components within normal limits  RESP PANEL BY RT-PCR (FLU A&B, COVID) ARPGX2  LACTIC ACID, PLASMA  HEPATIC FUNCTION PANEL  BRAIN NATRIURETIC PEPTIDE  LACTIC ACID, PLASMA  VITAMIN B12  VITAMIN B1  VITAMIN D 25 HYDROXY (VIT D DEFICIENCY, FRACTURES)  PROCALCITONIN  TROPONIN I (HIGH SENSITIVITY)  TROPONIN I (HIGH SENSITIVITY)   ____________________________________________  EKG  Sinus rhythm with a ventricular rate of 96, normal axis, unremarkable intervals with some nonspecific changes versus artifact in anterior and inferior leads without other appearance of acute ischemia. ____________________________________________  RADIOLOGY  ED MD interpretation: CT head and C-spine showed no acute intracranial hemorrhage, subacute infarct, edema, mass-effect or any other acute intracranial or acute C-spine process.  No evidence of traumatic subluxation.  Chest x-ray shows no rib fracture, spontaneous pneumothorax, focal consolidation or overt edema but does show stable cardiomegaly and some chronic changes in the lower zones consistent with patient's reported COPD and tobacco abuse history.  Official radiology report(s): DG Chest 2 View  Result Date: 03/07/2021 CLINICAL DATA:  Chest pain EXAM: CHEST - 2 VIEW COMPARISON:  Chest x-ray 03/06/2021 FINDINGS: Heart  is enlarged. Mediastinum appears stable. Calcified plaques in the aortic arch. Pulmonary vasculature is normal. Right-sided central line is stable. Lungs are hyperinflated with chronic prominent interstitial  lung markings/scarring mostly in the lower lung zones. Trace bilateral pleural effusions. No pneumothorax. IMPRESSION: 1. No acute process identified. 2. Cardiomegaly. 3. Chronic changes in the lower lung zones. Electronically Signed   By: Ofilia Neas   On: 03/07/2021 12:48   CT HEAD WO CONTRAST (5MM)  Result Date: 03/07/2021 CLINICAL DATA:  Fall, near syncope EXAM: CT HEAD WITHOUT CONTRAST CT CERVICAL SPINE WITHOUT CONTRAST TECHNIQUE: Multidetector CT imaging of the head and cervical spine was performed following the standard protocol without intravenous contrast. Multiplanar CT image reconstructions of the cervical spine were also generated. COMPARISON:  None. FINDINGS: CT HEAD FINDINGS Brain: No evidence of acute infarction, hemorrhage, hydrocephalus, extra-axial collection or mass lesion/mass effect. Mild periventricular white matter hypodensity. 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 Vascular: No hyperdense vessel or unexpected calcification. Skull: Normal. Negative for fracture or focal lesion. Sinuses/Orbits: No acute finding. Other: None. CT CERVICAL SPINE FINDINGS Alignment: Normal. Skull base and vertebrae: No acute fracture. No primary bone lesion or focal pathologic process. Soft tissues and spinal canal: No prevertebral fluid or swelling. No visible canal hematoma. Disc levels: Focally mild disc space height loss and osteophytosis of C5-C6, with otherwise preserved disc spaces. Upper chest: Negative. Other: None. IMPRESSION: 1. No acute intracranial pathology. Small-vessel white matter disease. 2. No fracture or static subluxation of the cervical spine. Electronically Signed   By: Delanna Ahmadi M.D.   On: 03/07/2021 12:42   CT Cervical Spine Wo Contrast  Result Date: 03/07/2021 CLINICAL DATA:  Fall, near syncope EXAM: CT HEAD WITHOUT CONTRAST CT CERVICAL SPINE WITHOUT CONTRAST TECHNIQUE: Multidetector CT imaging of the head and cervical spine was performed  following the standard protocol without intravenous contrast. Multiplanar CT image reconstructions of the cervical spine were also generated. COMPARISON:  None. FINDINGS: CT HEAD FINDINGS Brain: No evidence of acute infarction, hemorrhage, hydrocephalus, extra-axial collection or mass lesion/mass effect. Mild periventricular white matter hypodensity. 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 Vascular: No hyperdense vessel or unexpected calcification. Skull: Normal. Negative for fracture or focal lesion. Sinuses/Orbits: No acute finding. Other: None. CT CERVICAL SPINE FINDINGS Alignment: Normal. Skull base and vertebrae: No acute fracture. No primary bone lesion or focal pathologic process. Soft tissues and spinal canal: No prevertebral fluid or swelling. No visible canal hematoma. Disc levels: Focally mild disc space height loss and osteophytosis of C5-C6, with otherwise preserved disc spaces. Upper chest: Negative. Other: None. IMPRESSION: 1. No acute intracranial pathology. Small-vessel white matter disease. 2. No fracture or static subluxation of the cervical spine. Electronically Signed   By: Delanna Ahmadi M.D.   On: 03/07/2021 12:42    ____________________________________________   PROCEDURES  Procedure(s) performed (including Critical Care):  Procedures   ____________________________________________   INITIAL IMPRESSION / ASSESSMENT AND PLAN / ED COURSE      Patient presents with above-stated history and exam for multiple concerns and complaints after a reported syncopal episode that occurred after she completed dialysis earlier today.  Patient states she does not remember what happened and is not sure if she hit her head although has mild headache, chest pain as well as some neck and acute on chronic lower back pain.  She states her back pain is hurting patient did not receive her morning dose of oxycodone which was prescribed for  her yesterday.  She also notes  she has fallen almost 10 times in the last month and has been having chronic balance issues without clear cause.  With regard to syncope it certainly possible this was related to low blood pressures as on arrival patient's BP is 89/54 although on recheck it is 132/79.  She has a nonfocal neurological exam and I have a low suspicion for CVA.  Given she states she is not sure if she hit her head or neck and is complaining of headache CT head and C-spine obtained.   CT head and C-spine showed no acute intracranial hemorrhage, subacute infarct, edema, mass-effect or any other acute intracranial or acute C-spine process.  No evidence of traumatic subluxation.  Chest x-ray shows no rib fracture, spontaneous pneumothorax, focal consolidation or overt edema but does show stable cardiomegaly and some chronic changes in the lower zones consistent with patient's reported COPD and tobacco abuse history.  BNP is double digits and not consistent with acute volume overload.  Sinus rhythm with a ventricular rate of 96, normal axis, unremarkable intervals with some nonspecific changes versus artifact in anterior and inferior leads without other appearance of acute ischemia.  However given nonelevated troponin at 17 I would low suspicion for ACS or myocarditis as etiology for patient's syncope.  No significant arrhythmia identified.  We will plan to obtain a 2-hour repeat troponin.  CBC shows no leukocytosis or acute anemia explain patient's syncope.  BMP remarkable for K of 3.3 without any other significant electrolyte or metabolic derangements.  Kidney function is at baseline and consistent with history of ESRD.  Hepatic panel has no evidence of acute hepatitis or cholestasis.  Lactic is 1.8.  Unclear why patient's BP was low earlier today after dialysis if this was related to excessive following removal but I am unable to identify any other immediate life-threatening process at this time.  She has no history of trauma  and low suspicion for occult hemorrhage.  No history of recent significant GI losses.  She states her chest pain and shortness of breath are much better as is her back pain and headache after she was dose of her oxycodone prescribed for her.  Given history of recurrent falls and worsening balance issues and syncope earlier today I will plan to admit to medicine service for further evaluation and management.  ____________________________________________   FINAL CLINICAL IMPRESSION(S) / ED DIAGNOSES  Final diagnoses:  Recurrent falls  Syncope, unspecified syncope type  ESRD (end stage renal disease) (HCC)    Medications  lidocaine (LIDODERM) 5 % 1 patch (1 patch Transdermal Patch Applied 03/07/21 1204)  acetaminophen (TYLENOL) tablet 650 mg (has no administration in time range)    Or  acetaminophen (TYLENOL) suppository 650 mg (has no administration in time range)  ondansetron (ZOFRAN) tablet 4 mg (has no administration in time range)    Or  ondansetron (ZOFRAN) injection 4 mg (has no administration in time range)  heparin injection 5,000 Units (has no administration in time range)  ondansetron (ZOFRAN) injection 4 mg (4 mg Intravenous Given 03/07/21 1134)  sodium chloride 0.9 % bolus 500 mL (0 mLs Intravenous Stopped 03/07/21 1157)  oxyCODONE (Oxy IR/ROXICODONE) immediate release tablet 5 mg (5 mg Oral Given 03/07/21 1202)     ED Discharge Orders     None        Note:  This document was prepared using Dragon voice recognition software and may include unintentional dictation errors.    Lucrezia Starch, MD  03/07/21 1314  

## 2021-03-07 NOTE — ED Notes (Addendum)
Patient moved to CPOD, report given to [facility].

## 2021-03-07 NOTE — ED Notes (Signed)
Fsbs 132

## 2021-03-07 NOTE — ED Triage Notes (Signed)
Pt comes into the ED via EMS from dialysis center, states she had completed her dialysis treatment, was going out to wait for the bus and began having chest pain with SOB and a near syncope.  106/60 90 HR 98%RA

## 2021-03-07 NOTE — ED Notes (Signed)
Pt alert, watching tv.

## 2021-03-08 ENCOUNTER — Inpatient Hospital Stay: Payer: Medicaid Other

## 2021-03-08 DIAGNOSIS — Z599 Problem related to housing and economic circumstances, unspecified: Secondary | ICD-10-CM | POA: Diagnosis not present

## 2021-03-08 DIAGNOSIS — N184 Chronic kidney disease, stage 4 (severe): Secondary | ICD-10-CM

## 2021-03-08 DIAGNOSIS — I10 Essential (primary) hypertension: Secondary | ICD-10-CM | POA: Diagnosis not present

## 2021-03-08 DIAGNOSIS — I272 Pulmonary hypertension, unspecified: Secondary | ICD-10-CM

## 2021-03-08 DIAGNOSIS — N186 End stage renal disease: Secondary | ICD-10-CM | POA: Diagnosis present

## 2021-03-08 DIAGNOSIS — F419 Anxiety disorder, unspecified: Secondary | ICD-10-CM | POA: Diagnosis present

## 2021-03-08 DIAGNOSIS — Z20822 Contact with and (suspected) exposure to covid-19: Secondary | ICD-10-CM | POA: Diagnosis present

## 2021-03-08 DIAGNOSIS — K5900 Constipation, unspecified: Secondary | ICD-10-CM | POA: Diagnosis present

## 2021-03-08 DIAGNOSIS — Z801 Family history of malignant neoplasm of trachea, bronchus and lung: Secondary | ICD-10-CM | POA: Diagnosis not present

## 2021-03-08 DIAGNOSIS — I132 Hypertensive heart and chronic kidney disease with heart failure and with stage 5 chronic kidney disease, or end stage renal disease: Secondary | ICD-10-CM | POA: Diagnosis present

## 2021-03-08 DIAGNOSIS — J449 Chronic obstructive pulmonary disease, unspecified: Secondary | ICD-10-CM | POA: Diagnosis present

## 2021-03-08 DIAGNOSIS — G4700 Insomnia, unspecified: Secondary | ICD-10-CM | POA: Diagnosis present

## 2021-03-08 DIAGNOSIS — I953 Hypotension of hemodialysis: Secondary | ICD-10-CM | POA: Diagnosis present

## 2021-03-08 DIAGNOSIS — L89319 Pressure ulcer of right buttock, unspecified stage: Secondary | ICD-10-CM | POA: Diagnosis present

## 2021-03-08 DIAGNOSIS — L89329 Pressure ulcer of left buttock, unspecified stage: Secondary | ICD-10-CM | POA: Diagnosis present

## 2021-03-08 DIAGNOSIS — Z5900 Homelessness unspecified: Secondary | ICD-10-CM | POA: Diagnosis not present

## 2021-03-08 DIAGNOSIS — D631 Anemia in chronic kidney disease: Secondary | ICD-10-CM | POA: Diagnosis present

## 2021-03-08 DIAGNOSIS — E1122 Type 2 diabetes mellitus with diabetic chronic kidney disease: Secondary | ICD-10-CM | POA: Diagnosis present

## 2021-03-08 DIAGNOSIS — N179 Acute kidney failure, unspecified: Secondary | ICD-10-CM | POA: Diagnosis present

## 2021-03-08 DIAGNOSIS — Z992 Dependence on renal dialysis: Secondary | ICD-10-CM | POA: Diagnosis not present

## 2021-03-08 DIAGNOSIS — I5032 Chronic diastolic (congestive) heart failure: Secondary | ICD-10-CM | POA: Diagnosis present

## 2021-03-08 DIAGNOSIS — Z751 Person awaiting admission to adequate facility elsewhere: Secondary | ICD-10-CM | POA: Diagnosis not present

## 2021-03-08 DIAGNOSIS — F32A Depression, unspecified: Secondary | ICD-10-CM | POA: Diagnosis present

## 2021-03-08 DIAGNOSIS — I951 Orthostatic hypotension: Secondary | ICD-10-CM | POA: Diagnosis present

## 2021-03-08 DIAGNOSIS — R296 Repeated falls: Secondary | ICD-10-CM | POA: Diagnosis present

## 2021-03-08 DIAGNOSIS — N2581 Secondary hyperparathyroidism of renal origin: Secondary | ICD-10-CM | POA: Diagnosis present

## 2021-03-08 DIAGNOSIS — E1151 Type 2 diabetes mellitus with diabetic peripheral angiopathy without gangrene: Secondary | ICD-10-CM | POA: Diagnosis present

## 2021-03-08 LAB — CBC
HCT: 31.7 % — ABNORMAL LOW (ref 36.0–46.0)
Hemoglobin: 10.4 g/dL — ABNORMAL LOW (ref 12.0–15.0)
MCH: 32.3 pg (ref 26.0–34.0)
MCHC: 32.8 g/dL (ref 30.0–36.0)
MCV: 98.4 fL (ref 80.0–100.0)
Platelets: 277 10*3/uL (ref 150–400)
RBC: 3.22 MIL/uL — ABNORMAL LOW (ref 3.87–5.11)
RDW: 13.9 % (ref 11.5–15.5)
WBC: 6.2 10*3/uL (ref 4.0–10.5)
nRBC: 0 % (ref 0.0–0.2)

## 2021-03-08 LAB — ECHOCARDIOGRAM COMPLETE
AR max vel: 1.35 cm2
AV Area VTI: 1.46 cm2
AV Area mean vel: 1.43 cm2
AV Mean grad: 9.5 mmHg
AV Peak grad: 14.9 mmHg
Ao pk vel: 1.93 m/s
Area-P 1/2: 2.8 cm2
S' Lateral: 1.9 cm

## 2021-03-08 LAB — BASIC METABOLIC PANEL
Anion gap: 10 (ref 5–15)
BUN: 40 mg/dL — ABNORMAL HIGH (ref 8–23)
CO2: 25 mmol/L (ref 22–32)
Calcium: 8.2 mg/dL — ABNORMAL LOW (ref 8.9–10.3)
Chloride: 99 mmol/L (ref 98–111)
Creatinine, Ser: 4.91 mg/dL — ABNORMAL HIGH (ref 0.44–1.00)
GFR, Estimated: 9 mL/min — ABNORMAL LOW (ref >60)
Glucose, Bld: 94 mg/dL (ref 70–99)
Potassium: 4.3 mmol/L (ref 3.5–5.1)
Sodium: 134 mmol/L — ABNORMAL LOW (ref 135–145)

## 2021-03-08 LAB — GLUCOSE, CAPILLARY
Glucose-Capillary: 98 mg/dL (ref 70–99)
Glucose-Capillary: 119 mg/dL — ABNORMAL HIGH (ref 70–99)
Glucose-Capillary: 130 mg/dL — ABNORMAL HIGH (ref 70–99)
Glucose-Capillary: 98 mg/dL (ref 70–99)

## 2021-03-08 LAB — HEPATITIS B SURFACE ANTIBODY,QUALITATIVE: Hep B S Ab: REACTIVE — AB

## 2021-03-08 LAB — HEPATITIS B SURFACE ANTIGEN: Hepatitis B Surface Ag: NONREACTIVE

## 2021-03-08 MED ORDER — CHLORHEXIDINE GLUCONATE CLOTH 2 % EX PADS
6.0000 | MEDICATED_PAD | Freq: Every day | CUTANEOUS | Status: DC
  Administered 2021-03-08 – 2021-03-20 (×11): 6 via TOPICAL

## 2021-03-08 NOTE — NC FL2 (Signed)
[place] LEVEL OF CARE SCREENING TOOL     IDENTIFICATION  Patient Name: Jenisha Faison Birthdate: 14-Oct-1955 Sex: female Admission Date (Current Location): 03/07/2021  [facility] and [place] Number:      Facility and Address:         Provider Number: [id number]  Attending Physician Name and Address:  Nolberto Hanlon, MD  Relative Name and Phone Number:       Current Level of Care: Hospital Recommended Level of Care: [place] Prior Approval Number:    Date Approved/Denied:   PASRR Number: [id number], still need to submit  Discharge Plan:      Current Diagnoses: Patient Active Problem List   Diagnosis Date Noted   Frequent falls 03/07/2021   Homelessness 03/07/2021   Financial difficulty 03/07/2021   ESRD on dialysis ([place]) 12/23/2020   Persistent headaches 06/27/2020   ATN (acute tubular necrosis) ([place])    Pulmonary hypertension, unspecified ([place])    Chronic kidney disease (CKD), stage IV (severe) (HCC)    Pleural effusion    Acute heart failure with preserved ejection fraction (HFpEF) ([place])    Pressure injury of skin 05/01/2020   Hyperkalemia    Volume overload 04/30/2020   Type 2 diabetes mellitus with stage 5 chronic kidney disease ([place]) 04/30/2020   Hypertension    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[place])    Type 2 diabetes mellitus ([place])     Orientation RESPIRATION BLADDER Height & Weight     Self, Time, Situation, Place  Normal (renal fluid restriction 1200 ml) Continent Weight: 82.6 kg Height:  5\' 6"  (062.6 cm)  BEHAVIORAL SYMPTOMS/MOOD NEUROLOGICAL BOWEL NUTRITION STATUS      Continent Diet  AMBULATORY STATUS COMMUNICATION OF NEEDS Skin   Extensive Assist Verbally Normal                       Personal Care Assistance Level of Assistance              Functional Limitations Info             SPECIAL CARE FACTORS FREQUENCY  PT (By licensed PT), OT (By licensed OT)                     Contractures Contractures Info: Not present    Additional Factors Info  Code Status, Allergies (HD patient) Code Status Info: full Allergies Info: penicillin           Current Medications (03/08/2021):  This is the current hospital active medication list Current Facility-Administered Medications  Medication Dose Route Frequency Provider Last Rate Last Admin   acetaminophen (TYLENOL) tablet 650 mg  650 mg Oral Q6H PRN Cox, Amy N, DO       Or   acetaminophen (TYLENOL) suppository 650 mg  650 mg Rectal Q6H PRN Cox, Amy N, DO       carvedilol (COREG) tablet 12.5 mg  12.5 mg Oral BID WC Cox, Amy N, DO   12.5 mg at 03/08/21 1018   Chlorhexidine Gluconate Cloth 2 % PADS 6 each  6 each Topical Daily Nolberto Hanlon, MD   6 each at 03/08/21 1309   heparin injection 5,000 Units  5,000 Units Subcutaneous Q8H Cox, Amy N, DO   5,000 Units at 03/08/21 1309   insulin aspart (novoLOG) injection 0-5 Units  0-5 Units Subcutaneous QHS Cox, Amy N, DO       insulin aspart (novoLOG) injection 0-6  Units  0-6 Units Subcutaneous TID WC Cox, Amy N, DO   1 Units at 03/07/21 1739   lidocaine (LIDODERM) 5 % 1 patch  1 patch Transdermal Q24H Cox, Amy N, DO   1 patch at 03/08/21 1309   ondansetron (ZOFRAN) tablet 4 mg  4 mg Oral Q6H PRN Cox, Amy N, DO       Or   ondansetron (ZOFRAN) injection 4 mg  4 mg Intravenous Q6H PRN Cox, Amy N, DO       oxyCODONE (Oxy IR/ROXICODONE) immediate release tablet 5 mg  5 mg Oral Q6H PRN Cox, Amy N, DO   5 mg at 03/08/21 1018   topiramate (TOPAMAX) tablet 50 mg  50 mg Oral BID PRN Cox, Amy N, DO       traZODone (DESYREL) tablet 50 mg  50 mg Oral QPM Cox, Amy N, DO   50 mg at 03/07/21 1738   venlafaxine (EFFEXOR) tablet 37.5 mg  37.5 mg Oral BID WC Cox, Amy N, DO   37.5 mg at 03/08/21 1308     Discharge Medications: Please see discharge summary for a list of discharge medications.  Relevant Imaging Results:  Relevant Lab Results:   Additional Information ss  [id number]  Beverly Sessions, RN

## 2021-03-08 NOTE — Progress Notes (Signed)
Physical Therapy Treatment Patient Details Name: Katie [name] MRN: [id number] DOB: 01-03-1956 Today's Date: 03/08/2021   History of Present Illness Pt is a 65 y.o. female with medical history significant for hypertension, DM, end-stage renal disease on hemodialysis [facility], heart failure preserved ejection fraction, chronic low back pain, presents emergency department for chief concerns of frequent falls and syncope after dialysis. Pt struggles with homelessness and finanacial difficulty. MD assessment includes: increased falls and weakness, depression/anxiety, insominia, and HAs.    PT Comments    Pt received supine in bed agreeable to PT. Reports being in bad mood due to "bad news" with HA and cervical pain. Pt however remains motivated to improve her mobility and progress as able depending on her dizziness. Please see below for orthostatic vitals of session. Prior to mobility pt educated on LE AROM exercises to assist in increasing HR and BP in hopes to improve symptoms of orthostasis with transfers and standing. Pt supervision to transfer to EOB with  bed minimally elevated with no reports of dizziness. Noted increased in BP with sitting compared to supine. With minguard and use of RW, x2 STS to assess BP in standing. Pt endorses delayed onset of dizziness after ~20-30 sec requesting seated rest breaks. ~1-2 min rest to recover. On 3rd STS, stand pivot/side step transfer performed to recliner then from recliner back to bed after sitting in recliner for 5 min. Pt denies any symptoms with pivot transfers. Due to dizziness with standing, deferred further ambulation/functional mobility because she did report seeing stars with open of the standing attempts. Will require STR upon d/c to progress safe tolerance for progressive ambulation to reduce risk of falls.   Orthostatic BP readings:  Supine: 98/66, HR: 76  Sitting EOB: 115/92, HR: 85  Standing: 108/85, HR: 86 Sitting: 103/65, HR: 82  Post x2  stand pivot/ step transfers supine in bed : 121/65, HR: 83    Recommendations for follow up therapy are one component of a multi-disciplinary discharge planning process, led by the attending physician.  Recommendations may be updated based on patient status, additional functional criteria and insurance authorization.  Follow Up Recommendations  Skilled nursing-short term rehab (<3 hours/day)     Assistance Recommended at Discharge Intermittent Supervision/Assistance  Equipment Recommendations  None recommended by PT    Recommendations for Other Services       Precautions / Restrictions Precautions Precautions: Fall Restrictions Weight Bearing Restrictions: No Other Position/Activity Restrictions: Watch orthostatics     Mobility  Bed Mobility Overal bed mobility: Needs Assistance Bed Mobility: Supine to Sit;Sit to Supine   Sidelying to sit: Supervision;HOB elevated Supine to sit: Supervision;HOB elevated       Patient Response: Cooperative  Transfers Overall transfer level: Needs assistance Equipment used: Rolling walker (2 wheels) Transfers: Sit to/from [facility] Sit to Stand: Min guard Stand pivot transfers: Min guard         General transfer comment: Only requiring minguard for standing and pivot/step transfers    Ambulation/Gait Ambulation/Gait assistance: Min guard Gait Distance (Feet): 2 Feet Assistive device: Rolling walker (2 wheels) Gait Pattern/deviations: Step-to pattern;Shuffle;Decreased step length - right;Decreased step length - left;Trunk flexed     General Gait Details: Limited to tranferring bed <> recliner due to dizziness   Stairs             Wheelchair Mobility    Modified Rankin (Stroke Patients Only)       Balance Overall balance assessment: Needs assistance Sitting-balance support: Feet supported;No upper extremity  supported Sitting balance-Leahy Scale: Good     Standing balance support: Bilateral  upper extremity supported;During functional activity Standing balance-Leahy Scale: Fair Standing balance comment: Use of RW for minor stability                            Cognition Arousal/Alertness: Awake/alert Behavior During Therapy: WFL for tasks assessed/performed Overall Cognitive Status: Within Functional Limits for tasks assessed                                          Exercises General Exercises - Lower Extremity Ankle Circles/Pumps: AROM;Strengthening;Both;10 reps;Supine Long Arc Quad: AROM;Strengthening;Seated;Both;10 reps Heel Slides: AROM;Supine;Strengthening;Both;10 reps Hip ABduction/ADduction: AROM;Seated;Strengthening;Both;10 reps Straight Leg Raises: AROM;Supine;Strengthening;Both;10 reps Hip Flexion/Marching: AROM;Seated;Strengthening;Both;10 reps Other Exercises Other Exercises: Education on AROM exercises to elevate HR and BP to reduce dizziness with transfers    General Comments        Pertinent Vitals/Pain Pain Assessment: Faces Faces Pain Scale: Hurts a little bit Pain Location: HA and neck pain Pain Descriptors / Indicators: Headache;Aching;Sore Pain Intervention(s): Monitored during session;Repositioned    Home Living                          Prior Function            PT Goals (current goals can now be found in the care plan section) Acute Rehab PT Goals Patient Stated Goal: Improved strength and balance and return to independence Time For Goal Achievement: 03/20/21 Potential to Achieve Goals: Good Progress towards PT goals: Progressing toward goals    Frequency    Min 2X/week      PT Plan Current plan remains appropriate    Co-evaluation              AM-PAC PT "6 Clicks" Mobility   Outcome Measure  Help needed turning from your back to your side while in a flat bed without using bedrails?: A Little Help needed moving from lying on your back to sitting on the side of a flat bed  without using bedrails?: A Little Help needed moving to and from a bed to a chair (including a wheelchair)?: A Little Help needed standing up from a chair using your arms (e.g., wheelchair or bedside chair)?: A Little Help needed to walk in hospital room?: A Lot Help needed climbing 3-5 steps with a railing? : Total 6 Click Score: 15    End of Session Equipment Utilized During Treatment: Gait belt   Patient left: in bed;with call bell/phone within reach;with bed alarm set Nurse Communication: Mobility status PT Visit Diagnosis: Unsteadiness on feet (R26.81);History of falling (Z91.81);Muscle weakness (generalized) (M62.81);Difficulty in walking, not elsewhere classified (R26.2);Pain     Time: 2563-8937 PT Time Calculation (min) (ACUTE ONLY): 37 min  Charges:  $Therapeutic Exercise: 8-22 mins $Therapeutic Activity: 8-22 mins                     Shammond Arave M. Fairly IV, PT, DPT Physical Therapist- [facility]  03/08/2021, 3:49 PM

## 2021-03-08 NOTE — Progress Notes (Signed)
[facility]  ROUNDING NOTE   Subjective:   Katie [name] is a 65 year old female with past medical history including heart failure with preserved EF, chronic back pain chronic hypertension, and end-stage renal disease on hemodialysis. She has been admitted for ESRD (end stage renal disease) ([place]) [N18.6] Frequent falls [R29.6] Recurrent falls [R29.6] Syncope, unspecified syncope type [R55]   Patient is known to our clinic from prior admissions.  Patient receives outpatient dialysis treatments at [facility], supervised by Dr. Holley Raring.  She presents to the emergency room after a suspected syncopal episode after dialysis.  States while transferring from recliner to wheelchair, she became lightheaded.  Endorses shortness of breath and weakness with chest discomfort.  Also states she was dizzy.  Denies fever and diarrhea.  Reports intermittent nausea with vomiting over past couple days.  States she has changed the way she eats due to dialysis and maintains her fluid restriction.  She also voices concerns of current living situation.  States her caregiver may be overwhelmed with her recent change of health.  She is interested in nursing home placement.  We have been consulted to manage dialysis needs during this admission   Objective:  Vital signs in last 24 hours:  Temp:  [97.7 F (36.5 C)-98.6 F (37 C)] 98.6 F (37 C) (10/26 0918) Pulse Rate:  [72-110] 84 (10/26 0918) Resp:  [10-30] 17 (10/26 0918) BP: (82-180)/(48-102) 127/72 (10/26 0918) SpO2:  [85 %-99 %] 97 % (10/26 0918) Weight:  [82.6 kg] 82.6 kg (10/26 0918)  Weight change:  Filed Weights   03/08/21 0918  Weight: 82.6 kg    Intake/Output: No intake/output data recorded.   Intake/Output this shift:  No intake/output data recorded.  Physical Exam: General: NAD, laying in bed  Head: Normocephalic, atraumatic. Moist oral mucosal membranes  Eyes: Anicteric  Lungs:  Clear to auscultation, normal  breathing, [place] O2  Heart: Regular rate and rhythm  Abdomen:  Soft, nontender,   Extremities:  trace peripheral edema.  Neurologic: Nonfocal, moving all four extremities  Skin: No lesions  Access: Rt chest Permcath    Basic Metabolic Panel: Recent Labs  Lab 03/06/21 0940 03/07/21 1114 03/08/21 0633  NA 137 135 134*  K 4.2 3.3* 4.3  CL 102 97* 99  CO2 20* 26 25  GLUCOSE 104* 107* 94  BUN 48* 15 40*  CREATININE 5.53* 2.68* 4.91*  CALCIUM 8.8* 8.7* 8.2*    Liver Function Tests: Recent Labs  Lab 03/07/21 1114  AST 22  ALT 20  ALKPHOS 94  BILITOT 0.9  PROT 7.5  ALBUMIN 3.6   No results for input(s): LIPASE, AMYLASE in the last 168 hours. No results for input(s): AMMONIA in the last 168 hours.  CBC: Recent Labs  Lab 03/06/21 0940 03/07/21 1114 03/08/21 0633  WBC 7.9 8.7 6.2  HGB 11.7* 12.2 10.4*  HCT 35.7* 36.2 31.7*  MCV 99.2 97.6 98.4  PLT 332 316 277    Cardiac Enzymes: No results for input(s): CKTOTAL, CKMB, CKMBINDEX, TROPONINI in the last 168 hours.  BNP: Invalid input(s): POCBNP  CBG: Recent Labs  Lab 03/07/21 1727 03/07/21 2138 03/08/21 1006  GLUCAP 162* 132* 56    Microbiology: Results for orders placed or performed during the hospital encounter of 03/07/21  Resp Panel by RT-PCR (Flu A&B, Covid) Nasopharyngeal Swab     Status: None   Collection Time: 03/07/21 12:55 PM   Specimen: Nasopharyngeal Swab; Nasopharyngeal(NP) swabs in vial transport medium  Result Value Ref Range Status  SARS Coronavirus 2 by RT PCR NEGATIVE NEGATIVE Final    Comment: (NOTE) SARS-CoV-2 target nucleic acids are NOT DETECTED.  The SARS-CoV-2 RNA is generally detectable in upper respiratory specimens during the acute phase of infection. The lowest concentration of SARS-CoV-2 viral copies this assay can detect is 138 copies/mL. A negative result does not preclude SARS-Cov-2 infection and should not be used as the sole basis for treatment or other patient  management decisions. A negative result may occur with  improper specimen collection/handling, submission of specimen other than nasopharyngeal swab, presence of viral mutation(s) within the areas targeted by this assay, and inadequate number of viral copies(<138 copies/mL). A negative result must be combined with clinical observations, patient history, and epidemiological information. The expected result is Negative.  Fact Sheet for Patients:  EntrepreneurPulse.com.au  Fact Sheet for Healthcare Providers:  IncredibleEmployment.be  This test is no t yet approved or cleared by the [place] FDA and  has been authorized for detection and/or diagnosis of SARS-CoV-2 by FDA under an Emergency Use Authorization (EUA). This EUA will remain  in effect (meaning this test can be used) for the duration of the COVID-19 declaration under Section 564(b)(1) of the Act, 21 U.S.C.section 360bbb-3(b)(1), unless the authorization is terminated  or revoked sooner.       Influenza A by PCR NEGATIVE NEGATIVE Final   Influenza B by PCR NEGATIVE NEGATIVE Final    Comment: (NOTE) The Xpert Xpress SARS-CoV-2/FLU/RSV plus assay is intended as an aid in the diagnosis of influenza from Nasopharyngeal swab specimens and should not be used as a sole basis for treatment. Nasal washings and aspirates are unacceptable for Xpert Xpress SARS-CoV-2/FLU/RSV testing.  Fact Sheet for Patients: EntrepreneurPulse.com.au  Fact Sheet for Healthcare Providers: IncredibleEmployment.be  This test is not yet approved or cleared by the [place] FDA and has been authorized for detection and/or diagnosis of SARS-CoV-2 by FDA under an Emergency Use Authorization (EUA). This EUA will remain in effect (meaning this test can be used) for the duration of the COVID-19 declaration under Section 564(b)(1) of the Act, 21 U.S.C. section 360bbb-3(b)(1),  unless the authorization is terminated or revoked.  Performed at [facility], [address] [place], [place] [address]     Coagulation Studies: No results for input(s): LABPROT, INR in the last 72 hours.  Urinalysis: No results for input(s): COLORURINE, LABSPEC, PHURINE, GLUCOSEU, HGBUR, BILIRUBINUR, KETONESUR, PROTEINUR, UROBILINOGEN, NITRITE, LEUKOCYTESUR in the last 72 hours.  Invalid input(s): APPERANCEUR    Imaging: DG Chest 2 View  Result Date: 03/07/2021 CLINICAL DATA:  Chest pain EXAM: CHEST - 2 VIEW COMPARISON:  Chest x-ray 03/06/2021 FINDINGS: Heart is enlarged. Mediastinum appears stable. Calcified plaques in the aortic arch. Pulmonary vasculature is normal. Right-sided central line is stable. Lungs are hyperinflated with chronic prominent interstitial lung markings/scarring mostly in the lower lung zones. Trace bilateral pleural effusions. No pneumothorax. IMPRESSION: 1. No acute process identified. 2. Cardiomegaly. 3. Chronic changes in the lower lung zones. Electronically Signed   By: Ofilia Neas   On: 03/07/2021 12:48   DG Chest 2 View  Result Date: 03/06/2021 CLINICAL DATA:  Cough.  End stage kidney disease.  Pain EXAM: CHEST - 2 VIEW COMPARISON:  02/02/2021 FINDINGS: There is a right chest wall dialysis catheter with tips in the cavoatrial junction. Heart size normal. Aortic atherosclerotic calcifications. Blunting of the costophrenic angles are noted bilaterally. Similar appearance of mid and lower lung zone scarring. No signs of interstitial edema or airspace consolidation. Visualized  osseous structures are unremarkable. IMPRESSION: 1. Small pleural effusions 2. Bilateral lower lung zone predominant pulmonary parenchymal scarring. Electronically Signed   By: Kerby Moors M.D.   On: 03/06/2021 12:54   DG Lumbar Spine 2-3 Views  Result Date: 03/06/2021 CLINICAL DATA:  Back pain EXAM: LUMBAR SPINE - 2-3 VIEW COMPARISON:  CT abdomen pelvis,  12/12/2020 FINDINGS: No fracture or dislocation of the lumbar spine. Mild multilevel disc space height loss and osteophytosis. Mild multilevel facet degenerative change. Nonobstructive pattern of overlying bowel gas. IMPRESSION: No fracture or dislocation of the lumbar spine. Mild multilevel disc space height loss and osteophytosis. Mild multilevel facet degenerative change. Electronically Signed   By: Delanna Ahmadi M.D.   On: 03/06/2021 12:53   CT HEAD WO CONTRAST (5MM)  Result Date: 03/07/2021 CLINICAL DATA:  Fall, near syncope EXAM: CT HEAD WITHOUT CONTRAST CT CERVICAL SPINE WITHOUT CONTRAST TECHNIQUE: Multidetector CT imaging of the head and cervical spine was performed following the standard protocol without intravenous contrast. Multiplanar CT image reconstructions of the cervical spine were also generated. COMPARISON:  None. FINDINGS: CT HEAD FINDINGS Brain: No evidence of acute infarction, hemorrhage, hydrocephalus, extra-axial collection or mass lesion/mass effect. Mild periventricular white matter hypodensity. 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 Vascular: No hyperdense vessel or unexpected calcification. Skull: Normal. Negative for fracture or focal lesion. Sinuses/Orbits: No acute finding. Other: None. CT CERVICAL SPINE FINDINGS Alignment: Normal. Skull base and vertebrae: No acute fracture. No primary bone lesion or focal pathologic process. Soft tissues and spinal canal: No prevertebral fluid or swelling. No visible canal hematoma. Disc levels: Focally mild disc space height loss and osteophytosis of C5-C6, with otherwise preserved disc spaces. Upper chest: Negative. Other: None. IMPRESSION: 1. No acute intracranial pathology. Small-vessel white matter disease. 2. No fracture or static subluxation of the cervical spine. Electronically Signed   By: Delanna Ahmadi M.D.   On: 03/07/2021 12:42   CT HEAD WO CONTRAST (5MM)  Result Date: 03/06/2021 CLINICAL DATA:   Severe headache and back spasms. EXAM: CT HEAD WITHOUT CONTRAST TECHNIQUE: Contiguous axial images were obtained from the base of the skull through the vertex without intravenous contrast. COMPARISON:  December 15, 2020 FINDINGS: Brain: No evidence of acute infarction, hemorrhage, hydrocephalus, extra-axial collection or mass lesion/mass effect. Vascular: No hyperdense vessel. Atherosclerotic calcifications of the internal carotid and vertebral arteries at the skull base. Skull: Normal. Negative for fracture or focal lesion. Sinuses/Orbits: Visualized portions of the paranasal sinuses and mastoid air cells are predominantly clear. Orbits are unremarkable. Other: None. IMPRESSION: No acute intracranial abnormality. Electronically Signed   By: Dahlia Bailiff M.D.   On: 03/06/2021 12:29   CT Cervical Spine Wo Contrast  Result Date: 03/07/2021 CLINICAL DATA:  Fall, near syncope EXAM: CT HEAD WITHOUT CONTRAST CT CERVICAL SPINE WITHOUT CONTRAST TECHNIQUE: Multidetector CT imaging of the head and cervical spine was performed following the standard protocol without intravenous contrast. Multiplanar CT image reconstructions of the cervical spine were also generated. COMPARISON:  None. FINDINGS: CT HEAD FINDINGS Brain: No evidence of acute infarction, hemorrhage, hydrocephalus, extra-axial collection or mass lesion/mass effect. Mild periventricular white matter hypodensity. 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 Vascular: No hyperdense vessel or unexpected calcification. Skull: Normal. Negative for fracture or focal lesion. Sinuses/Orbits: No acute finding. Other: None. CT CERVICAL SPINE FINDINGS Alignment: Normal. Skull base and vertebrae: No acute fracture. No primary bone lesion or focal pathologic process. Soft tissues and spinal canal: No prevertebral  fluid or swelling. No visible canal hematoma. Disc levels: Focally mild disc space height loss and osteophytosis of C5-C6, with otherwise  preserved disc spaces. Upper chest: Negative. Other: None. IMPRESSION: 1. No acute intracranial pathology. Small-vessel white matter disease. 2. No fracture or static subluxation of the cervical spine. Electronically Signed   By: Delanna Ahmadi M.D.   On: 03/07/2021 12:42     Medications:     carvedilol  12.5 mg Oral BID WC   heparin  5,000 Units Subcutaneous Q8H   insulin aspart  0-5 Units Subcutaneous QHS   insulin aspart  0-6 Units Subcutaneous TID WC   lidocaine  1 patch Transdermal Q24H   traZODone  50 mg Oral QPM   venlafaxine  37.5 mg Oral BID WC   acetaminophen **OR** acetaminophen, ondansetron **OR** ondansetron (ZOFRAN) IV, oxyCODONE, topiramate  Assessment/ Plan:  [name] is a 65 y.o.  female with past medical history including heart failure with preserved EF, chronic back pain chronic hypertension, and end-stage renal disease on hemodialysis. She has been admitted for ESRD (end stage renal disease) ([place]) [N18.6] Frequent falls [R29.6] Recurrent falls [R29.6] Syncope, unspecified syncope type [R55]  [facility]/TTS/Rt Permcath/84 kg   End-stage renal disease on hemodialysis.  Will maintain outpatient schedule, if possible.  Patient scheduled for dialysis tomorrow.  Dialysis coordinator notified of arrival and available for outpatient needs.  2. Anemia of chronic kidney disease Lab Results  Component Value Date   HGB 10.4 (L) 03/08/2021   Hgb at goal  3. Secondary Hyperparathyroidism: Lab Results  Component Value Date   PTH 70 (H) 05/12/2020   CALCIUM 8.2 (L) 03/08/2021   PHOS 2.7 06/27/2020  Calcium below target. Phosphorus at goal  4. Chronic diastolic heart failure: ECHO from 03/07/21 shows EF of 60-65%.    LOS: 0 Katie [name] 10/26/202211:03 AM

## 2021-03-08 NOTE — TOC Initial Note (Signed)
Transition of Care [facility]) - Initial/Assessment Note    Patient Details  Name: Katie [name] MRN: [id number] Date of Birth: 06-Mar-1956  Transition of Care [facility]) CM/SW Contact:    Beverly Sessions, RN Phone Number: 03/08/2021, 2:51 PM  Clinical Narrative:                  Patient admitted with falls and increased weakness Patient states that she lives at a friends house in [place].  Patient states that in her current condition her friend is not able to care for her  Patient states that she has a new PCP appointment scheduled at [facility] Monday Uses Medicaid transport to get to appointments  Patient states that she has RW in the home PT recommending SNF Patient agreeable, states that she may need LTC after STR  Unable to log in to NCMUST at this time Fl2 sent for signature Bed search initiated  [name] dialysis liaison notified of admission.    Expected Discharge Plan: Skilled Nursing Facility Barriers to Discharge: Continued Medical Work up   Patient Goals and CMS Choice        Expected Discharge Plan and Services Expected Discharge Plan: [place] arrangements for the past 2 months: Single Family Home                                      Prior Living Arrangements/Services Living arrangements for the past 2 months: Single Family Home Lives with:: Friends Patient language and need for interpreter reviewed:: Yes        Need for Family Participation in Patient Care: Yes (Comment) Care giver support system in place?: Yes (comment) Current home services: DME Criminal Activity/Legal Involvement Pertinent to Current Situation/Hospitalization: No - Comment as needed  Activities of Daily Living Home Assistive Devices/Equipment: Walker (specify type), Raised toilet seat with rails, Eyeglasses (walker 4 wheel,) ADL Screening (condition at time of admission) Patient's cognitive ability adequate to safely complete daily  activities?: No Is the patient deaf or have difficulty hearing?: No Does the patient have difficulty seeing, even when wearing glasses/contacts?: Yes Does the patient have difficulty concentrating, remembering, or making decisions?: Yes Patient able to express need for assistance with ADLs?: Yes Does the patient have difficulty dressing or bathing?: Yes Independently performs ADLs?: Yes (appropriate for developmental age) Does the patient have difficulty walking or climbing stairs?: Yes Weakness of Legs: Both Weakness of Arms/Hands: None  Permission Sought/Granted                  Emotional Assessment       Orientation: : Oriented to Self, Oriented to Place, Oriented to  Time, Oriented to Situation Alcohol / Substance Use: Not Applicable    Admission diagnosis:  ESRD (end stage renal disease) ([place]) [N18.6] Frequent falls [R29.6] Recurrent falls [R29.6] Syncope, unspecified syncope type [R55] Patient Active Problem List   Diagnosis Date Noted   Frequent falls 03/07/2021   Homelessness 03/07/2021   Financial difficulty 03/07/2021   ESRD on dialysis ([place]) 12/23/2020   Persistent headaches 06/27/2020   ATN (acute tubular necrosis) ([place])    Pulmonary hypertension, unspecified ([place])    Chronic kidney disease (CKD), stage IV (severe) (HCC)    Pleural effusion    Acute heart failure with preserved ejection fraction (HFpEF) ([place])    Pressure injury of skin 05/01/2020   Hyperkalemia  Volume overload 04/30/2020   Type 2 diabetes mellitus with stage 5 chronic kidney disease ([place]) 04/30/2020   Hypertension    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[place])    Type 2 diabetes mellitus ([place])    PCP:  Patient, [name] (Inactive) Pharmacy:   [id number] [place], [place] [address] Phone: [phone] Fax: [place] [address]), [place] [address] Phone: [phone] Fax: [place] [address], [name] - [name] [address] Phone: [phone] Fax: [phone]     Social Determinants of Health (SDOH) Interventions    Readmission Risk Interventions Readmission Risk Prevention Plan 03/08/2021  Transportation Screening Complete  Medication Review (RN Care Manager) Complete  HRI or [place] Complete

## 2021-03-08 NOTE — Progress Notes (Signed)
PROGRESS NOTE    Katie [name]  CZY:[id number] DOB: 18-Jun-1955 DOA: 03/07/2021 PCP: Patient, [name] (Inactive)    Brief Narrative:  Katie [name] is a 65 Katie [name] with medical history significant for hypertension, end-stage renal disease on hemodialysis [facility], heart failure preserved ejection fraction, chronic low back pain, presents emergency department for chief concerns of frequent falls and syncope after dialysis.  10/26 bp better. PT rec. snf  Consultants:  nephrology  Procedures:   Antimicrobials:      Subjective: No dizziness, sob, or cp  Objective: Vitals:   03/08/21 0500 03/08/21 0530 03/08/21 0600 03/08/21 0918  BP: 115/61 122/62 107/61 127/72  Pulse: 72 75 76 84  Resp: 10 11 12 17   Temp:    98.6 F (37 C)  TempSrc:    Oral  SpO2: 95% 95% 95% 97%  Weight:    82.6 kg  Height:    5\' 6"  (1.676 m)   No intake or output data in the 24 hours ending 03/08/21 1317 Filed Weights   03/08/21 0918  Weight: 82.6 kg    Examination:  General exam: Appears calm and comfortable  Respiratory system: Clear to auscultation. Respiratory effort normal. Cardiovascular system: S1 & S2 heard, RRR. No gallops  Gastrointestinal system: Abdomen is nondistended, soft and nontender. Normal bowel sounds heard. Central nervous system: Alert and oriented.x3  Extremities: No edema Psychiatry: Judgement and insight appear normal. Mood & affect appropriate.     Data Reviewed: I have personally reviewed following labs and imaging studies  CBC: Recent Labs  Lab 03/06/21 0940 03/07/21 1114 03/08/21 0633  WBC 7.9 8.7 6.2  HGB 11.7* 12.2 10.4*  HCT 35.7* 36.2 31.7*  MCV 99.2 97.6 98.4  PLT 332 316 093   Basic Metabolic Panel: Recent Labs  Lab 03/06/21 0940 03/07/21 1114 03/08/21 0633  NA 137 135 134*  K 4.2 3.3* 4.3  CL 102 97* 99  CO2 20* 26 25  GLUCOSE 104* 107* 94  BUN 48* 15 40*  CREATININE 5.53* 2.68* 4.91*  CALCIUM 8.8* 8.7* 8.2*   GFR: Estimated  Creatinine Clearance: 12.9 mL/min (A) (by C-G formula based on SCr of 4.91 mg/dL (H)). Liver Function Tests: Recent Labs  Lab 03/07/21 1114  AST 22  ALT 20  ALKPHOS 94  BILITOT 0.9  PROT 7.5  ALBUMIN 3.6   No results for input(s): LIPASE, AMYLASE in the last 168 hours. No results for input(s): AMMONIA in the last 168 hours. Coagulation Profile: No results for input(s): INR, PROTIME in the last 168 hours. Cardiac Enzymes: No results for input(s): CKTOTAL, CKMB, CKMBINDEX, TROPONINI in the last 168 hours. BNP (last 3 results) No results for input(s): PROBNP in the last 8760 hours. HbA1C: Recent Labs    03/07/21 1419  HGBA1C 5.1   CBG: Recent Labs  Lab 03/07/21 1727 03/07/21 2138 03/08/21 1006 03/08/21 1137  GLUCAP 162* 132* 98 119*   Lipid Profile: No results for input(s): CHOL, HDL, LDLCALC, TRIG, CHOLHDL, LDLDIRECT in the last 72 hours. Thyroid Function Tests: Recent Labs    03/07/21 1419  TSH 0.549   Anemia Panel: Recent Labs    03/07/21 1419  VITAMINB12 655   Sepsis Labs: Recent Labs  Lab 03/07/21 1135 03/07/21 1320 03/07/21 1419  PROCALCITON  --  0.56  --   LATICACIDVEN 1.8  --  1.0    Recent Results (from the past 240 hour(s))  Resp Panel by RT-PCR (Flu A&B, Covid) Nasopharyngeal Swab     Status: None  Collection Time: 03/07/21 12:55 PM   Specimen: Nasopharyngeal Swab; Nasopharyngeal(NP) swabs in vial transport medium  Result Value Ref Range Status   SARS Coronavirus 2 by RT PCR NEGATIVE NEGATIVE Final    Comment: (NOTE) SARS-CoV-2 target nucleic acids are NOT DETECTED.  The SARS-CoV-2 RNA is generally detectable in upper respiratory specimens during the acute phase of infection. The lowest concentration of SARS-CoV-2 viral copies this assay can detect is 138 copies/mL. A negative result does not preclude SARS-Cov-2 infection and should not be used as the sole basis for treatment or other patient management decisions. A negative result may  occur with  improper specimen collection/handling, submission of specimen other than nasopharyngeal swab, presence of viral mutation(s) within the areas targeted by this assay, and inadequate number of viral copies(<138 copies/mL). A negative result must be combined with clinical observations, patient history, and epidemiological information. The expected result is Negative.  Fact Sheet for Patients:  EntrepreneurPulse.com.au  Fact Sheet for Healthcare Providers:  IncredibleEmployment.be  This test is no t yet approved or cleared by the [place] FDA and  has been authorized for detection and/or diagnosis of SARS-CoV-2 by FDA under an Emergency Use Authorization (EUA). This EUA will remain  in effect (meaning this test can be used) for the duration of the COVID-19 declaration under Section 564(b)(1) of the Act, 21 U.S.C.section 360bbb-3(b)(1), unless the authorization is terminated  or revoked sooner.       Influenza A by PCR NEGATIVE NEGATIVE Final   Influenza B by PCR NEGATIVE NEGATIVE Final    Comment: (NOTE) The Xpert Xpress SARS-CoV-2/FLU/RSV plus assay is intended as an aid in the diagnosis of influenza from Nasopharyngeal swab specimens and should not be used as a sole basis for treatment. Nasal washings and aspirates are unacceptable for Xpert Xpress SARS-CoV-2/FLU/RSV testing.  Fact Sheet for Patients: EntrepreneurPulse.com.au  Fact Sheet for Healthcare Providers: IncredibleEmployment.be  This test is not yet approved or cleared by the [place] FDA and has been authorized for detection and/or diagnosis of SARS-CoV-2 by FDA under an Emergency Use Authorization (EUA). This EUA will remain in effect (meaning this test can be used) for the duration of the COVID-19 declaration under Section 564(b)(1) of the Act, 21 U.S.C. section 360bbb-3(b)(1), unless the authorization is terminated  or revoked.  Performed at [facility], [address] [place], [place] [address]          Radiology Studies: DG Chest 2 View  Result Date: 03/07/2021 CLINICAL DATA:  Chest pain EXAM: CHEST - 2 VIEW COMPARISON:  Chest x-ray 03/06/2021 FINDINGS: Heart is enlarged. Mediastinum appears stable. Calcified plaques in the aortic arch. Pulmonary vasculature is normal. Right-sided central line is stable. Lungs are hyperinflated with chronic prominent interstitial lung markings/scarring mostly in the lower lung zones. Trace bilateral pleural effusions. No pneumothorax. IMPRESSION: 1. No acute process identified. 2. Cardiomegaly. 3. Chronic changes in the lower lung zones. Electronically Signed   By: Ofilia Neas   On: 03/07/2021 12:48   CT HEAD WO CONTRAST (5MM)  Result Date: 03/07/2021 CLINICAL DATA:  Fall, near syncope EXAM: CT HEAD WITHOUT CONTRAST CT CERVICAL SPINE WITHOUT CONTRAST TECHNIQUE: Multidetector CT imaging of the head and cervical spine was performed following the standard protocol without intravenous contrast. Multiplanar CT image reconstructions of the cervical spine were also generated. COMPARISON:  None. FINDINGS: CT HEAD FINDINGS Brain: No evidence of acute infarction, hemorrhage, hydrocephalus, extra-axial collection or mass lesion/mass effect. Mild periventricular white matter hypodensity. Incidental note of mega cisterna  magna variant of the posterior fossa. Vascular: No hyperdense vessel or unexpected calcification. Skull: Normal. Negative for fracture or focal lesion. Sinuses/Orbits: No acute finding. Other: None. CT CERVICAL SPINE FINDINGS Alignment: Normal. Skull base and vertebrae: No acute fracture. No primary bone lesion or focal pathologic process. Soft tissues and spinal canal: No prevertebral fluid or swelling. No visible canal hematoma. Disc levels: Focally mild disc space height loss and osteophytosis of C5-C6, with otherwise preserved disc spaces. Upper  chest: Negative. Other: None. IMPRESSION: 1. No acute intracranial pathology. Small-vessel white matter disease. 2. No fracture or static subluxation of the cervical spine. Electronically Signed   By: Delanna Ahmadi M.D.   On: 03/07/2021 12:42   CT Cervical Spine Wo Contrast  Result Date: 03/07/2021 CLINICAL DATA:  Fall, near syncope EXAM: CT HEAD WITHOUT CONTRAST CT CERVICAL SPINE WITHOUT CONTRAST TECHNIQUE: Multidetector CT imaging of the head and cervical spine was performed following the standard protocol without intravenous contrast. Multiplanar CT image reconstructions of the cervical spine were also generated. COMPARISON:  None. FINDINGS: CT HEAD FINDINGS Brain: No evidence of acute infarction, hemorrhage, hydrocephalus, extra-axial collection or mass lesion/mass effect. Mild periventricular white matter hypodensity. 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 Vascular: No hyperdense vessel or unexpected calcification. Skull: Normal. Negative for fracture or focal lesion. Sinuses/Orbits: No acute finding. Other: None. CT CERVICAL SPINE FINDINGS Alignment: Normal. Skull base and vertebrae: No acute fracture. No primary bone lesion or focal pathologic process. Soft tissues and spinal canal: No prevertebral fluid or swelling. No visible canal hematoma. Disc levels: Focally mild disc space height loss and osteophytosis of C5-C6, with otherwise preserved disc spaces. Upper chest: Negative. Other: None. IMPRESSION: 1. No acute intracranial pathology. Small-vessel white matter disease. 2. No fracture or static subluxation of the cervical spine. Electronically Signed   By: Delanna Ahmadi M.D.   On: 03/07/2021 12:42        Scheduled Meds:  carvedilol  12.5 mg Oral BID WC   Chlorhexidine Gluconate Cloth  6 each Topical Daily   heparin  5,000 Units Subcutaneous Q8H   insulin aspart  0-5 Units Subcutaneous QHS   insulin aspart  0-6 Units Subcutaneous TID WC   lidocaine  1 patch  Transdermal Q24H   traZODone  50 mg Oral QPM   venlafaxine  37.5 mg Oral BID WC   Continuous Infusions:  Assessment & Plan:   Active Problems:   Hypertension   Type 2 diabetes mellitus (HCC)   Chronic kidney disease (CKD), stage IV (severe) ([place])   Pulmonary hypertension, unspecified ([place])   ESRD on dialysis ([place])   Frequent falls   Homelessness   Financial difficulty   # Increased fall and weakness- Likely secondary to postdialysis treatment and hypotension B12, B1, vitamin D and procalcitonin was ordered for work-up Echo ordered.  Previous echo on 12/21 revealed normal EF Fall precautions PT recommends SNF Case management working on SNF placement     # End-stage renal disease on hemodialysis - Patient completed hemodialysis session on 03/07/2021 10/26 nephrology made aware  # Hypertension- Stable continue on carvedilol   # Depression/anxiety-continue home venlafaxine     # Insomnia-trazodone 50 mg nightly    # Headaches-topiramate 50 mg p.o. twice daily as needed for headaches   # History of diabetes type 2-presumed improved with end-stage renal disease BG stable Continue RISS    DVT prophylaxis: Heparin Code Status: Full Family Communication: None at bedside Disposition Plan:  Status is: Inpatient  Remains inpatient appropriate because: Unsafe discharge.  Needs SNF placement.  Case management working on this        LOS: 0 days   Time spent: 35 minutes with more than 50% on [place], MD [facility] Pager [phone]  If 7PM-7AM, please contact night-coverage 03/08/2021, 1:17 PM

## 2021-03-08 NOTE — NC FL2 (Signed)
[place] LEVEL OF CARE SCREENING TOOL     IDENTIFICATION  Patient Name: Katie [name] Birthdate: June 18, 1955 Sex: female Admission Date (Current Location): 03/07/2021  [facility] [place] [facility] [place] Number:      Facility and Address:         Provider Number: [id number]  Attending Physician Name and Address:  Nolberto Hanlon, MD  Relative Name and Phone Number:       Current Level of Care: Hospital Recommended Level of Care: [place] Prior Approval Number:    Date Approved/Denied:   PASRR Number: NCMUST down, still need to submit  Discharge Plan:      Current Diagnoses: Patient Active Problem List   Diagnosis Date Noted   Frequent falls 03/07/2021   Homelessness 03/07/2021   Financial difficulty 03/07/2021   ESRD on dialysis ([place]) 12/23/2020   Persistent headaches 06/27/2020   ATN (acute tubular necrosis) ([place])    Pulmonary hypertension, unspecified ([place])    Chronic kidney disease (CKD), stage IV (severe) (HCC)    Pleural effusion    Acute heart failure with preserved ejection fraction (HFpEF) ([place])    Pressure injury of skin 05/01/2020   Hyperkalemia    Volume overload 04/30/2020   Type 2 diabetes mellitus with stage 5 chronic kidney disease ([place]) 04/30/2020   Hypertension    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[place])    Type 2 diabetes mellitus ([place])     Orientation RESPIRATION BLADDER Height & Weight     Self, Time, Situation, Place  Normal (renal fluid restriction 1200 ml) Continent Weight: 82.6 kg Height:  5\' 6"  (167.6 cm)  BEHAVIORAL SYMPTOMS/MOOD NEUROLOGICAL BOWEL NUTRITION STATUS      Continent Diet  AMBULATORY STATUS COMMUNICATION OF NEEDS Skin   Extensive Assist Verbally PU Stage and Appropriate Care                       Personal Care Assistance Level of Assistance              Functional Limitations Info             SPECIAL CARE FACTORS FREQUENCY  PT (By licensed PT), OT (By licensed OT)                     Contractures Contractures Info: Not present    Additional Factors Info  Code Status, Allergies (HD patient) Code Status Info: full Allergies Info: penicillin           Current Medications (03/08/2021):  This is the current hospital active medication list Current Facility-Administered Medications  Medication Dose Route Frequency Provider Last Rate Last Admin   acetaminophen (TYLENOL) tablet 650 mg  650 mg Oral Q6H PRN Cox, Amy N, DO       Or   acetaminophen (TYLENOL) suppository 650 mg  650 mg Rectal Q6H PRN Cox, Amy N, DO       carvedilol (COREG) tablet 12.5 mg  12.5 mg Oral BID WC Cox, Amy N, DO   12.5 mg at 03/08/21 1018   Chlorhexidine Gluconate Cloth 2 % PADS 6 each  6 each Topical Daily Nolberto Hanlon, MD   6 each at 03/08/21 1309   heparin injection 5,000 Units  5,000 Units Subcutaneous Q8H Cox, Amy N, DO   5,000 Units at 03/08/21 1309   insulin aspart (novoLOG) injection 0-5 Units  0-5 Units Subcutaneous QHS Cox, Amy N, DO       insulin  aspart (novoLOG) injection 0-6 Units  0-6 Units Subcutaneous TID WC Cox, Amy N, DO   1 Units at 03/07/21 1739   lidocaine (LIDODERM) 5 % 1 patch  1 patch Transdermal Q24H Cox, Amy N, DO   1 patch at 03/08/21 1309   ondansetron (ZOFRAN) tablet 4 mg  4 mg Oral Q6H PRN Cox, Amy N, DO       Or   ondansetron (ZOFRAN) injection 4 mg  4 mg Intravenous Q6H PRN Cox, Amy N, DO       oxyCODONE (Oxy IR/ROXICODONE) immediate release tablet 5 mg  5 mg Oral Q6H PRN Cox, Amy N, DO   5 mg at 03/08/21 1018   topiramate (TOPAMAX) tablet 50 mg  50 mg Oral BID PRN Cox, Amy N, DO       traZODone (DESYREL) tablet 50 mg  50 mg Oral QPM Cox, Amy N, DO   50 mg at 03/07/21 1738   venlafaxine (EFFEXOR) tablet 37.5 mg  37.5 mg Oral BID WC Cox, Amy N, DO   37.5 mg at 03/08/21 1308     Discharge Medications: Please see discharge summary for a list of discharge medications.  Relevant Imaging Results:  Relevant Lab Results:   Additional Information ss  [id number]  Beverly Sessions, RN

## 2021-03-08 NOTE — ED Notes (Signed)
[facility] RN aware of assigned bed 

## 2021-03-09 DIAGNOSIS — Z599 Problem related to housing and economic circumstances, unspecified: Secondary | ICD-10-CM | POA: Diagnosis not present

## 2021-03-09 DIAGNOSIS — N184 Chronic kidney disease, stage 4 (severe): Secondary | ICD-10-CM | POA: Diagnosis not present

## 2021-03-09 DIAGNOSIS — R296 Repeated falls: Secondary | ICD-10-CM | POA: Diagnosis not present

## 2021-03-09 DIAGNOSIS — N186 End stage renal disease: Secondary | ICD-10-CM | POA: Diagnosis not present

## 2021-03-09 LAB — GLUCOSE, CAPILLARY
Glucose-Capillary: 108 mg/dL — ABNORMAL HIGH (ref 70–99)
Glucose-Capillary: 79 mg/dL (ref 70–99)
Glucose-Capillary: 118 mg/dL — ABNORMAL HIGH (ref 70–99)
Glucose-Capillary: 114 mg/dL — ABNORMAL HIGH (ref 70–99)

## 2021-03-09 LAB — HEPATITIS B SURFACE ANTIBODY, QUANTITATIVE: Hep B S AB Quant (Post): 10.3 m[IU]/mL (ref >9.9)

## 2021-03-09 LAB — VITAMIN B1: Vitamin B1 (Thiamine): 147.9 nmol/L (ref 66.5–200.0)

## 2021-03-09 MED ORDER — ALTEPLASE 2 MG IJ SOLR: 2.0000 mg | Freq: Once | INTRAMUSCULAR | Status: DC | PRN

## 2021-03-09 MED ORDER — PENTAFLUOROPROP-TETRAFLUOROETH EX AERO
1.0000 "application " | INHALATION_SPRAY | CUTANEOUS | Status: DC | PRN
  Filled 2021-03-09: qty 30

## 2021-03-09 MED ORDER — SODIUM CHLORIDE 0.9 % IV SOLN: 100.0000 mL | INTRAVENOUS | Status: DC | PRN

## 2021-03-09 MED ORDER — LIDOCAINE HCL (PF) 1 % IJ SOLN
5.0000 mL | INTRAMUSCULAR | Status: DC | PRN
  Filled 2021-03-09: qty 5

## 2021-03-09 MED ORDER — LIDOCAINE-PRILOCAINE 2.5-2.5 % EX CREA
1.0000 "application " | TOPICAL_CREAM | CUTANEOUS | Status: DC | PRN
  Filled 2021-03-09: qty 5

## 2021-03-09 MED ORDER — HEPARIN SODIUM (PORCINE) 1000 UNIT/ML IJ SOLN
INTRAMUSCULAR | Status: AC
  Filled 2021-03-09: qty 1

## 2021-03-09 MED ORDER — HEPARIN SODIUM (PORCINE) 1000 UNIT/ML DIALYSIS: 1000.0000 [IU] | INTRAMUSCULAR | Status: DC | PRN

## 2021-03-09 MED ORDER — OXYCODONE HCL 5 MG PO TABS
5.0000 mg | ORAL_TABLET | Freq: Four times a day (QID) | ORAL | Status: DC | PRN
  Administered 2021-03-09 – 2021-03-21 (×22): 5 mg via ORAL
  Filled 2021-03-09 (×22): qty 1

## 2021-03-09 MED ORDER — DIPHENHYDRAMINE HCL 25 MG PO CAPS
ORAL_CAPSULE | ORAL | Status: AC
  Filled 2021-03-09: qty 1

## 2021-03-09 MED ORDER — ONDANSETRON HCL 4 MG/2ML IJ SOLN
INTRAMUSCULAR | Status: AC
  Filled 2021-03-09: qty 2

## 2021-03-09 MED ORDER — DIPHENHYDRAMINE HCL 25 MG PO CAPS
25.0000 mg | ORAL_CAPSULE | Freq: Once | ORAL | Status: AC
  Administered 2021-03-09: 25 mg via ORAL

## 2021-03-09 NOTE — TOC Progression Note (Addendum)
Transition of Care [facility]) - Progression Note    Patient Details  Name: Katie [name] MRN: [id number] Date of Birth: 05-15-1955  Transition [facility] Care [facility]) CM/SW Contact  Beverly Sessions, RN Phone Number: 03/09/2021, 3:56 PM  Clinical Narrative:     Bed offer presented Patient accepted bed at [facility] Accepted in [place].  Notified Tressa Busman at [place] and he will start auth after OT evals patient Notified [name] that HD center will need to be changed    Expected Discharge Plan: [place] Barriers to Discharge: Continued Medical Work up  Expected Discharge Plan and Services Expected Discharge Plan: [place] arrangements for the past 2 months: Single Family Home                                       Social Determinants of Health (SDOH) Interventions    Readmission Risk Interventions Readmission Risk Prevention Plan 03/08/2021  Transportation Screening Complete  Medication Review Press photographer) Complete  HRI or [place] Complete

## 2021-03-09 NOTE — Progress Notes (Signed)
PROGRESS NOTE    Katie [name]  IDP:[id number] DOB: 04/09/1956 DOA: 03/07/2021 PCP: Patient, [name] (Inactive)    Brief Narrative:  Katie [name] is a 65 y.o. female with medical history significant for hypertension, end-stage renal disease on hemodialysis TuThSa, heart failure preserved ejection fraction, chronic low back pain, presents emergency department for chief concerns of frequent falls and syncope after dialysis.  10/26 bp better. PT rec. Snf 10/27 in dialysis this AM no chest pain, dizziness or hypotension.  Held her carvedilol a.m. dose prior to dialysis.  Consultants:  nephrology  Procedures:   Antimicrobials:      Subjective: No complaints Objective: Vitals:   03/08/21 2037 03/09/21 0418 03/09/21 0739 03/09/21 0816  BP: (!) 119/55 122/69 (!) 110/55   Pulse: 74 74 75   Resp: 18 18 18    Temp: 98.1 F (36.7 C) 98.5 F (36.9 C) 98.9 F (37.2 C)   TempSrc: Oral Oral Oral   SpO2: 95% 98% 96%   Weight:    98 kg  Height:        Intake/Output Summary (Last 24 hours) at 03/09/2021 0826 Last data filed at 03/09/2021 4431 Gross per 24 hour  Intake 0 ml  Output --  Net 0 ml   Filed Weights   03/08/21 0918 03/09/21 0816  Weight: 82.6 kg 98 kg    Examination: NAD, calm CTA no wheeze rales Regular S1-S2 no gallops Soft benign positive bowel sounds No edema Awake and oriented x3   Data Reviewed: I have personally reviewed following labs and imaging studies  CBC: Recent Labs  Lab 03/06/21 0940 03/07/21 1114 03/08/21 0633  WBC 7.9 8.7 6.2  HGB 11.7* 12.2 10.4*  HCT 35.7* 36.2 31.7*  MCV 99.2 97.6 98.4  PLT 332 316 540   Basic Metabolic Panel: Recent Labs  Lab 03/06/21 0940 03/07/21 1114 03/08/21 0633  NA 137 135 134*  K 4.2 3.3* 4.3  CL 102 97* 99  CO2 20* 26 25  GLUCOSE 104* 107* 94  BUN 48* 15 40*  CREATININE 5.53* 2.68* 4.91*  CALCIUM 8.8* 8.7* 8.2*   GFR: Estimated Creatinine Clearance: 14 mL/min (A) (by C-G formula based on  SCr of 4.91 mg/dL (H)). Liver Function Tests: Recent Labs  Lab 03/07/21 1114  AST 22  ALT 20  ALKPHOS 94  BILITOT 0.9  PROT 7.5  ALBUMIN 3.6   No results for input(s): LIPASE, AMYLASE in the last 168 hours. No results for input(s): AMMONIA in the last 168 hours. Coagulation Profile: No results for input(s): INR, PROTIME in the last 168 hours. Cardiac Enzymes: No results for input(s): CKTOTAL, CKMB, CKMBINDEX, TROPONINI in the last 168 hours. BNP (last 3 results) No results for input(s): PROBNP in the last 8760 hours. HbA1C: Recent Labs    03/07/21 1419  HGBA1C 5.1   CBG: Recent Labs  Lab 03/07/21 2138 03/08/21 1006 03/08/21 1137 03/08/21 1638 03/08/21 2216  GLUCAP 132* 98 119* 130* 98   Lipid Profile: No results for input(s): CHOL, HDL, LDLCALC, TRIG, CHOLHDL, LDLDIRECT in the last 72 hours. Thyroid Function Tests: Recent Labs    03/07/21 1419  TSH 0.549   Anemia Panel: Recent Labs    03/07/21 1419  VITAMINB12 655   Sepsis Labs: Recent Labs  Lab 03/07/21 1135 03/07/21 1320 03/07/21 1419  PROCALCITON  --  0.56  --   LATICACIDVEN 1.8  --  1.0    Recent Results (from the past 240 hour(s))  Resp Panel by RT-PCR (Flu A&B,  Covid) Nasopharyngeal Swab     Status: None   Collection Time: 03/07/21 12:55 PM   Specimen: Nasopharyngeal Swab; Nasopharyngeal(NP) swabs in vial transport medium  Result Value Ref Range Status   SARS Coronavirus 2 by RT PCR NEGATIVE NEGATIVE Final    Comment: (NOTE) SARS-CoV-2 target nucleic acids are NOT DETECTED.  The SARS-CoV-2 RNA is generally detectable in upper respiratory specimens during the acute phase of infection. The lowest concentration of SARS-CoV-2 viral copies this assay can detect is 138 copies/mL. A negative result does not preclude SARS-Cov-2 infection and should not be used as the sole basis for treatment or other patient management decisions. A negative result may occur with  improper specimen  collection/handling, submission of specimen other than nasopharyngeal swab, presence of viral mutation(s) within the areas targeted by this assay, and inadequate number of viral copies(<138 copies/mL). A negative result must be combined with clinical observations, patient history, and epidemiological information. The expected result is Negative.  Fact Sheet for Patients:  EntrepreneurPulse.com.au  Fact Sheet for Healthcare Providers:  IncredibleEmployment.be  This test is no t yet approved or cleared by the [place] FDA and  has been authorized for detection and/or diagnosis of SARS-CoV-2 by FDA under an Emergency Use Authorization (EUA). This EUA will remain  in effect (meaning this test can be used) for the duration of the COVID-19 declaration under Section 564(b)(1) of the Act, 21 U.S.C.section 360bbb-3(b)(1), unless the authorization is terminated  or revoked sooner.       Influenza A by PCR NEGATIVE NEGATIVE Final   Influenza B by PCR NEGATIVE NEGATIVE Final    Comment: (NOTE) The Xpert Xpress SARS-CoV-2/FLU/RSV plus assay is intended as an aid in the diagnosis of influenza from Nasopharyngeal swab specimens and should not be used as a sole basis for treatment. Nasal washings and aspirates are unacceptable for Xpert Xpress SARS-CoV-2/FLU/RSV testing.  Fact Sheet for Patients: EntrepreneurPulse.com.au  Fact Sheet for Healthcare Providers: IncredibleEmployment.be  This test is not yet approved or cleared by the [place] FDA and has been authorized for detection and/or diagnosis of SARS-CoV-2 by FDA under an Emergency Use Authorization (EUA). This EUA will remain in effect (meaning this test can be used) for the duration of the COVID-19 declaration under Section 564(b)(1) of the Act, 21 U.S.C. section 360bbb-3(b)(1), unless the authorization is terminated or revoked.  Performed at [facility], [address] [place], [place] [address]          Radiology Studies: DG Chest 2 View  Result Date: 03/07/2021 CLINICAL DATA:  Chest pain EXAM: CHEST - 2 VIEW COMPARISON:  Chest x-ray 03/06/2021 FINDINGS: Heart is enlarged. Mediastinum appears stable. Calcified plaques in the aortic arch. Pulmonary vasculature is normal. Right-sided central line is stable. Lungs are hyperinflated with chronic prominent interstitial lung markings/scarring mostly in the lower lung zones. Trace bilateral pleural effusions. No pneumothorax. IMPRESSION: 1. No acute process identified. 2. Cardiomegaly. 3. Chronic changes in the lower lung zones. Electronically Signed   By: Ofilia Neas   On: 03/07/2021 12:48   CT HEAD WO CONTRAST (5MM)  Result Date: 03/07/2021 CLINICAL DATA:  Fall, near syncope EXAM: CT HEAD WITHOUT CONTRAST CT CERVICAL SPINE WITHOUT CONTRAST TECHNIQUE: Multidetector CT imaging of the head and cervical spine was performed following the standard protocol without intravenous contrast. Multiplanar CT image reconstructions of the cervical spine were also generated. COMPARISON:  None. FINDINGS: CT HEAD FINDINGS Brain: No evidence of acute infarction, hemorrhage, hydrocephalus, extra-axial collection or mass lesion/mass  effect. Mild periventricular white matter hypodensity. 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 Vascular: No hyperdense vessel or unexpected calcification. Skull: Normal. Negative for fracture or focal lesion. Sinuses/Orbits: No acute finding. Other: None. CT CERVICAL SPINE FINDINGS Alignment: Normal. Skull base and vertebrae: No acute fracture. No primary bone lesion or focal pathologic process. Soft tissues and spinal canal: No prevertebral fluid or swelling. No visible canal hematoma. Disc levels: Focally mild disc space height loss and osteophytosis of C5-C6, with otherwise preserved disc spaces. Upper chest: Negative. Other: None.  IMPRESSION: 1. No acute intracranial pathology. Small-vessel white matter disease. 2. No fracture or static subluxation of the cervical spine. Electronically Signed   By: Delanna Ahmadi M.D.   On: 03/07/2021 12:42   CT Cervical Spine Wo Contrast  Result Date: 03/07/2021 CLINICAL DATA:  Fall, near syncope EXAM: CT HEAD WITHOUT CONTRAST CT CERVICAL SPINE WITHOUT CONTRAST TECHNIQUE: Multidetector CT imaging of the head and cervical spine was performed following the standard protocol without intravenous contrast. Multiplanar CT image reconstructions of the cervical spine were also generated. COMPARISON:  None. FINDINGS: CT HEAD FINDINGS Brain: No evidence of acute infarction, hemorrhage, hydrocephalus, extra-axial collection or mass lesion/mass effect. Mild periventricular white matter hypodensity. 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 Vascular: No hyperdense vessel or unexpected calcification. Skull: Normal. Negative for fracture or focal lesion. Sinuses/Orbits: No acute finding. Other: None. CT CERVICAL SPINE FINDINGS Alignment: Normal. Skull base and vertebrae: No acute fracture. No primary bone lesion or focal pathologic process. Soft tissues and spinal canal: No prevertebral fluid or swelling. No visible canal hematoma. Disc levels: Focally mild disc space height loss and osteophytosis of C5-C6, with otherwise preserved disc spaces. Upper chest: Negative. Other: None. IMPRESSION: 1. No acute intracranial pathology. Small-vessel white matter disease. 2. No fracture or static subluxation of the cervical spine. Electronically Signed   By: Delanna Ahmadi M.D.   On: 03/07/2021 12:42   ECHOCARDIOGRAM COMPLETE  Result Date: 03/08/2021    ECHOCARDIOGRAM REPORT   Patient Name:   [facility] Date of Exam: 03/07/2021 Medical Rec #:  [id number]    Height:       66.0 in Accession #:    [id number]   Weight:       173.0 lb Date of Birth:  [birth year]   BSA:          1.880 m Patient Age:     32 years     BP:           90/53 mmHg Patient Gender: F            HR:           85 bpm. Exam Location:  [facility] Procedure: 2D Echo, Cardiac Doppler and Color Doppler Indications:     R55 Syncope  History:         Patient has prior history of Echocardiogram examinations, most                  recent 05/01/2020. Risk Factors:Hypertension and Diabetes.                  Chronic kidney disease.  Sonographer:     Cresenciano Lick RDCS Referring Phys:  [id number] AMY N COX Diagnosing Phys: Isaias Cowman MD IMPRESSIONS  1. Left ventricular ejection fraction, by estimation, is 60 to 65%. The left ventricle has normal function. The left ventricle has no regional wall motion abnormalities. There is moderate left ventricular  hypertrophy. Left ventricular diastolic parameters are consistent with Grade I diastolic dysfunction (impaired relaxation).  2. Right ventricular systolic function is normal. The right ventricular size is normal.  3. The mitral valve is normal in structure. Mild mitral valve regurgitation. No evidence of mitral stenosis.  4. The aortic valve is normal in structure. Aortic valve regurgitation is not visualized. Mild aortic valve stenosis.  5. The inferior vena cava is normal in size with greater than 50% respiratory variability, suggesting right atrial pressure of 3 mmHg. FINDINGS  Left Ventricle: Left ventricular ejection fraction, by estimation, is 60 to 65%. The left ventricle has normal function. The left ventricle has no regional wall motion abnormalities. The left ventricular internal cavity size was normal in size. There is  moderate left ventricular hypertrophy. Left ventricular diastolic parameters are consistent with Grade I diastolic dysfunction (impaired relaxation). Right Ventricle: The right ventricular size is normal. No increase in right ventricular wall thickness. Right ventricular systolic function is normal. Left Atrium: Left atrial size was normal in size. Right Atrium: Right  atrial size was normal in size. Pericardium: There is no evidence of pericardial effusion. Mitral Valve: The mitral valve is normal in structure. Mild mitral valve regurgitation. No evidence of mitral valve stenosis. Tricuspid Valve: The tricuspid valve is normal in structure. Tricuspid valve regurgitation is mild . No evidence of tricuspid stenosis. Aortic Valve: The aortic valve is normal in structure. Aortic valve regurgitation is not visualized. Mild aortic stenosis is present. Aortic valve mean gradient measures 9.5 mmHg. Aortic valve peak gradient measures 14.9 mmHg. Aortic valve area, by VTI measures 1.46 cm. Pulmonic Valve: The pulmonic valve was normal in structure. Pulmonic valve regurgitation is not visualized. No evidence of pulmonic stenosis. Aorta: The aortic root is normal in size and structure. Venous: The inferior vena cava is normal in size with greater than 50% respiratory variability, suggesting right atrial pressure of 3 mmHg. IAS/Shunts: No atrial level shunt detected by color flow Doppler.  LEFT VENTRICLE PLAX 2D LVIDd:         2.70 cm   Diastology LVIDs:         1.90 cm   LV e' medial:    5.84 cm/s LV PW:         1.60 cm   LV E/e' medial:  13.7 LV IVS:        1.60 cm   LV e' lateral:   4.30 cm/s LVOT diam:     1.80 cm   LV E/e' lateral: 18.6 LV SV:         54 LV SV Index:   29 LVOT Area:     2.54 cm  RIGHT VENTRICLE RV Basal diam:  4.00 cm RV S prime:     9.57 cm/s TAPSE (M-mode): 2.8 cm LEFT ATRIUM             Index        RIGHT ATRIUM           Index LA diam:        4.00 cm 2.13 cm/m   RA Area:     14.10 cm LA Vol (A2C):   39.7 ml 21.11 ml/m  RA Volume:   37.50 ml  19.94 ml/m LA Vol (A4C):   42.1 ml 22.39 ml/m LA Biplane Vol: 42.6 ml 22.66 ml/m  AORTIC VALVE AV Area (Vmax):    1.35 cm AV Area (Vmean):   1.43 cm AV Area (VTI):     1.46 cm AV Vmax:  192.75 cm/s AV Vmean:          146.000 cm/s AV VTI:            0.372 m AV Peak Grad:      14.9 mmHg AV Mean Grad:      9.5  mmHg LVOT Vmax:         102.50 cm/s LVOT Vmean:        82.100 cm/s LVOT VTI:          0.213 m LVOT/AV VTI ratio: 0.57  AORTA Ao Root diam: 3.70 cm MITRAL VALVE MV Area (PHT): 2.80 cm     SHUNTS MV Decel Time: 271 msec     Systemic VTI:  0.21 m MV E velocity: 79.90 cm/s   Systemic Diam: 1.80 cm MV A velocity: 115.00 cm/s MV E/A ratio:  0.69 Isaias Cowman MD Electronically signed by Isaias Cowman MD Signature Date/Time: 03/08/2021/1:45:22 PM    Final         Scheduled Meds:  carvedilol  12.5 mg Oral BID WC   Chlorhexidine Gluconate Cloth  6 each Topical Daily   heparin  5,000 Units Subcutaneous Q8H   insulin aspart  0-5 Units Subcutaneous QHS   insulin aspart  0-6 Units Subcutaneous TID WC   lidocaine  1 patch Transdermal Q24H   traZODone  50 mg Oral QPM   venlafaxine  37.5 mg Oral BID WC   Continuous Infusions:  sodium chloride     sodium chloride      Assessment & Plan:   Active Problems:   Hypertension   Type 2 diabetes mellitus (HCC)   Chronic kidney disease (CKD), stage IV (severe) ([place])   Pulmonary hypertension, unspecified ([place])   ESRD on dialysis ([place])   Frequent falls   Homelessness   Financial difficulty   # Increased fall and weakness- Likely secondary to postdialysis treatment and hypotension B12, B1, vitamin D and procalcitonin was ordered for work-up Echo ordered.  Previous echo on 12/21 revealed normal EF Fall precautions 10/27 PT recommended SNF.  Case management working on placement       # End-stage renal disease on hemodialysis - Patient completed hemodialysis session on 03/07/2021 10/27 nephrology following Hemodialysis today    # Hypertension- continue on carvedilol.  Holding a.m. dose prior to dialysis today   # Depression/anxiety-continue home venlafaxine      # Insomnia-trazodone 50 mg nightly    # Headaches-topiramate 50 mg p.o. twice daily as needed for headaches   # History of diabetes type 2-presumed improved with  end-stage renal disease BG stable Continue RISS    DVT prophylaxis: Heparin Code Status: Full Family Communication: None at bedside Disposition Plan:  Status is: Inpatient  Remains inpatient appropriate because: Unsafe discharge.  Needs SNF placement.  Case management working on this        LOS: 1 day   Time spent: 35 minutes with more than 50% on [place], MD [facility] Pager [phone]  If 7PM-7AM, please contact night-coverage 03/09/2021, 8:26 AM

## 2021-03-09 NOTE — TOC Progression Note (Addendum)
Transition of Care [facility]) - Progression Note    Patient Details  Name: Susann Lawhorne MRN: [id number] Date of Birth: 27-May-1955  [facility] CM/SW Contact  Beverly Sessions, RN Phone Number: 03/09/2021, 10:14 AM  Clinical Narrative:     Josefina Do [phone] [id number]  No bed offers   Expected Discharge Plan: [place] Barriers to Discharge: Continued Medical Work up  Expected Discharge Plan and Services Expected Discharge Plan: [place] arrangements for the past 2 months: Single Family Home                                       Social Determinants of Health (SDOH) Interventions    Readmission Risk Interventions Readmission Risk Prevention Plan 03/08/2021  Transportation Screening Complete  Medication Review Press photographer) Complete  HRI or [place] Complete

## 2021-03-09 NOTE — Progress Notes (Signed)
[facility]  ROUNDING NOTE   Subjective:   Katie [name] is a 65 year old female with past medical history including heart failure with preserved EF, chronic back pain chronic hypertension, and end-stage renal disease on hemodialysis. She has been admitted for ESRD (end stage renal disease) ([place]) [N18.6] Frequent falls [R29.6] Recurrent falls [R29.6] Syncope, unspecified syncope type [R55]   Patient is known to our clinic from prior admissions.  Patient receives outpatient dialysis treatments at [facility], supervised by Dr. Holley Raring.    Patient seen and evaluated during dialysis   HEMODIALYSIS FLOWSHEET:  Blood Flow Rate (mL/min): 400 mL/min Arterial Pressure (mmHg): -180 mmHg Venous Pressure (mmHg): 120 mmHg Transmembrane Pressure (mmHg): 60 mmHg Ultrafiltration Rate (mL/min): 500 mL/min Dialysate Flow Rate (mL/min): 500 ml/min Conductivity: Machine : 13.8 Conductivity: Machine : 13.8  No complaints at this time   Objective:  Vital signs in last 24 hours:  Temp:  [97.8 F (36.6 C)-98.9 F (37.2 C)] 98.6 F (37 C) (10/27 0816) Pulse Rate:  [74-85] 75 (10/27 0739) Resp:  [16-23] 21 (10/27 0906) BP: (109-122)/(55-76) 110/55 (10/27 0739) SpO2:  [95 %-98 %] 96 % (10/27 0739) Weight:  [98 kg] 98 kg (10/27 0816)  Weight change:  Filed Weights   03/08/21 0918 03/09/21 0816  Weight: 82.6 kg 98 kg    Intake/Output: No intake/output data recorded.   Intake/Output this shift:  No intake/output data recorded.  Physical Exam: General: NAD, laying in bed  Head: Normocephalic, atraumatic. Moist oral mucosal membranes  Eyes: Anicteric  Lungs:  Clear to auscultation, normal breathing, [place] O2  Heart: Regular rate and rhythm  Abdomen:  Soft, nontender  Extremities:  trace peripheral edema.  Neurologic: Nonfocal, moving all four extremities  Skin: No lesions  Access: Rt chest Permcath    Basic Metabolic Panel: Recent Labs  Lab 03/06/21 0940  03/07/21 1114 03/08/21 0633  NA 137 135 134*  K 4.2 3.3* 4.3  CL 102 97* 99  CO2 20* 26 25  GLUCOSE 104* 107* 94  BUN 48* 15 40*  CREATININE 5.53* 2.68* 4.91*  CALCIUM 8.8* 8.7* 8.2*     Liver Function Tests: Recent Labs  Lab 03/07/21 1114  AST 22  ALT 20  ALKPHOS 94  BILITOT 0.9  PROT 7.5  ALBUMIN 3.6    No results for input(s): LIPASE, AMYLASE in the last 168 hours. No results for input(s): AMMONIA in the last 168 hours.  CBC: Recent Labs  Lab 03/06/21 0940 03/07/21 1114 03/08/21 0633  WBC 7.9 8.7 6.2  HGB 11.7* 12.2 10.4*  HCT 35.7* 36.2 31.7*  MCV 99.2 97.6 98.4  PLT 332 316 277     Cardiac Enzymes: No results for input(s): CKTOTAL, CKMB, CKMBINDEX, TROPONINI in the last 168 hours.  BNP: Invalid input(s): POCBNP  CBG: Recent Labs  Lab 03/08/21 1006 03/08/21 1137 03/08/21 1638 03/08/21 2216 03/09/21 0748  GLUCAP 98 119* 130* 98 108*     Microbiology: Results for orders placed or performed during the hospital encounter of 03/07/21  Resp Panel by RT-PCR (Flu A&B, Covid) Nasopharyngeal Swab     Status: None   Collection Time: 03/07/21 12:55 PM   Specimen: Nasopharyngeal Swab; Nasopharyngeal(NP) swabs in vial transport medium  Result Value Ref Range Status   SARS Coronavirus 2 by RT PCR NEGATIVE NEGATIVE Final    Comment: (NOTE) SARS-CoV-2 target nucleic acids are NOT DETECTED.  The SARS-CoV-2 RNA is generally detectable in upper respiratory specimens during the acute phase of infection. The lowest  concentration of SARS-CoV-2 viral copies this assay can detect is 138 copies/mL. A negative result does not preclude SARS-Cov-2 infection and should not be used as the sole basis for treatment or other patient management decisions. A negative result may occur with  improper specimen collection/handling, submission of specimen other than nasopharyngeal swab, presence of viral mutation(s) within the areas targeted by this assay, and inadequate  number of viral copies(<138 copies/mL). A negative result must be combined with clinical observations, patient history, and epidemiological information. The expected result is Negative.  Fact Sheet for Patients:  EntrepreneurPulse.com.au  Fact Sheet for Healthcare Providers:  IncredibleEmployment.be  This test is no t yet approved or cleared by the [place] FDA and  has been authorized for detection and/or diagnosis of SARS-CoV-2 by FDA under an Emergency Use Authorization (EUA). This EUA will remain  in effect (meaning this test can be used) for the duration of the COVID-19 declaration under Section 564(b)(1) of the Act, 21 U.S.C.section 360bbb-3(b)(1), unless the authorization is terminated  or revoked sooner.       Influenza A by PCR NEGATIVE NEGATIVE Final   Influenza B by PCR NEGATIVE NEGATIVE Final    Comment: (NOTE) The Xpert Xpress SARS-CoV-2/FLU/RSV plus assay is intended as an aid in the diagnosis of influenza from Nasopharyngeal swab specimens and should not be used as a sole basis for treatment. Nasal washings and aspirates are unacceptable for Xpert Xpress SARS-CoV-2/FLU/RSV testing.  Fact Sheet for Patients: EntrepreneurPulse.com.au  Fact Sheet for Healthcare Providers: IncredibleEmployment.be  This test is not yet approved or cleared by the [place] FDA and has been authorized for detection and/or diagnosis of SARS-CoV-2 by FDA under an Emergency Use Authorization (EUA). This EUA will remain in effect (meaning this test can be used) for the duration of the COVID-19 declaration under Section 564(b)(1) of the Act, 21 U.S.C. section 360bbb-3(b)(1), unless the authorization is terminated or revoked.  Performed at [facility], [address] [place], [place] [address]     Coagulation Studies: No results for input(s): LABPROT, INR in the last 72  hours.  Urinalysis: No results for input(s): COLORURINE, LABSPEC, PHURINE, GLUCOSEU, HGBUR, BILIRUBINUR, KETONESUR, PROTEINUR, UROBILINOGEN, NITRITE, LEUKOCYTESUR in the last 72 hours.  Invalid input(s): APPERANCEUR    Imaging: DG Chest 2 View  Result Date: 03/07/2021 CLINICAL DATA:  Chest pain EXAM: CHEST - 2 VIEW COMPARISON:  Chest x-ray 03/06/2021 FINDINGS: Heart is enlarged. Mediastinum appears stable. Calcified plaques in the aortic arch. Pulmonary vasculature is normal. Right-sided central line is stable. Lungs are hyperinflated with chronic prominent interstitial lung markings/scarring mostly in the lower lung zones. Trace bilateral pleural effusions. No pneumothorax. IMPRESSION: 1. No acute process identified. 2. Cardiomegaly. 3. Chronic changes in the lower lung zones. Electronically Signed   By: Ofilia Neas   On: 03/07/2021 12:48   CT HEAD WO CONTRAST (5MM)  Result Date: 03/07/2021 CLINICAL DATA:  Fall, near syncope EXAM: CT HEAD WITHOUT CONTRAST CT CERVICAL SPINE WITHOUT CONTRAST TECHNIQUE: Multidetector CT imaging of the head and cervical spine was performed following the standard protocol without intravenous contrast. Multiplanar CT image reconstructions of the cervical spine were also generated. COMPARISON:  None. FINDINGS: CT HEAD FINDINGS Brain: No evidence of acute infarction, hemorrhage, hydrocephalus, extra-axial collection or mass lesion/mass effect. Mild periventricular white matter hypodensity. 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 Vascular: No hyperdense vessel or unexpected calcification. Skull: Normal. Negative for fracture or focal lesion. Sinuses/Orbits: No acute finding. Other: None. CT  CERVICAL SPINE FINDINGS Alignment: Normal. Skull base and vertebrae: No acute fracture. No primary bone lesion or focal pathologic process. Soft tissues and spinal canal: No prevertebral fluid or swelling. No visible canal hematoma. Disc levels:  Focally mild disc space height loss and osteophytosis of C5-C6, with otherwise preserved disc spaces. Upper chest: Negative. Other: None. IMPRESSION: 1. No acute intracranial pathology. Small-vessel white matter disease. 2. No fracture or static subluxation of the cervical spine. Electronically Signed   By: Delanna Ahmadi M.D.   On: 03/07/2021 12:42   CT Cervical Spine Wo Contrast  Result Date: 03/07/2021 CLINICAL DATA:  Fall, near syncope EXAM: CT HEAD WITHOUT CONTRAST CT CERVICAL SPINE WITHOUT CONTRAST TECHNIQUE: Multidetector CT imaging of the head and cervical spine was performed following the standard protocol without intravenous contrast. Multiplanar CT image reconstructions of the cervical spine were also generated. COMPARISON:  None. FINDINGS: CT HEAD FINDINGS Brain: No evidence of acute infarction, hemorrhage, hydrocephalus, extra-axial collection or mass lesion/mass effect. Mild periventricular white matter hypodensity. 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 Vascular: No hyperdense vessel or unexpected calcification. Skull: Normal. Negative for fracture or focal lesion. Sinuses/Orbits: No acute finding. Other: None. CT CERVICAL SPINE FINDINGS Alignment: Normal. Skull base and vertebrae: No acute fracture. No primary bone lesion or focal pathologic process. Soft tissues and spinal canal: No prevertebral fluid or swelling. No visible canal hematoma. Disc levels: Focally mild disc space height loss and osteophytosis of C5-C6, with otherwise preserved disc spaces. Upper chest: Negative. Other: None. IMPRESSION: 1. No acute intracranial pathology. Small-vessel white matter disease. 2. No fracture or static subluxation of the cervical spine. Electronically Signed   By: Delanna Ahmadi M.D.   On: 03/07/2021 12:42   ECHOCARDIOGRAM COMPLETE  Result Date: 03/08/2021    ECHOCARDIOGRAM REPORT   Patient Name:   [facility] Date of Exam: 03/07/2021 Medical Rec #:  [id number]     Height:       66.0 in Accession #:    [id number]   Weight:       173.0 lb Date of Birth:  11-07-1955   BSA:          1.880 m Patient Age:    60 years     BP:           90/53 mmHg Patient Gender: F            HR:           85 bpm. Exam Location:  [facility] Procedure: 2D Echo, Cardiac Doppler and Color Doppler Indications:     R55 Syncope  History:         Patient has prior history of Echocardiogram examinations, most                  recent 05/01/2020. Risk Factors:Hypertension and Diabetes.                  Chronic kidney disease.  Sonographer:     Cresenciano Lick RDCS Referring Phys:  [id number] AMY N COX Diagnosing Phys: Isaias Cowman MD IMPRESSIONS  1. Left ventricular ejection fraction, by estimation, is 60 to 65%. The left ventricle has normal function. The left ventricle has no regional wall motion abnormalities. There is moderate left ventricular hypertrophy. Left ventricular diastolic parameters are consistent with Grade I diastolic dysfunction (impaired relaxation).  2. Right ventricular systolic function is normal. The right ventricular size is normal.  3. The mitral valve is normal in structure. Mild mitral  valve regurgitation. No evidence of mitral stenosis.  4. The aortic valve is normal in structure. Aortic valve regurgitation is not visualized. Mild aortic valve stenosis.  5. The inferior vena cava is normal in size with greater than 50% respiratory variability, suggesting right atrial pressure of 3 mmHg. FINDINGS  Left Ventricle: Left ventricular ejection fraction, by estimation, is 60 to 65%. The left ventricle has normal function. The left ventricle has no regional wall motion abnormalities. The left ventricular internal cavity size was normal in size. There is  moderate left ventricular hypertrophy. Left ventricular diastolic parameters are consistent with Grade I diastolic dysfunction (impaired relaxation). Right Ventricle: The right ventricular size is normal. No increase in right  ventricular wall thickness. Right ventricular systolic function is normal. Left Atrium: Left atrial size was normal in size. Right Atrium: Right atrial size was normal in size. Pericardium: There is no evidence of pericardial effusion. Mitral Valve: The mitral valve is normal in structure. Mild mitral valve regurgitation. No evidence of mitral valve stenosis. Tricuspid Valve: The tricuspid valve is normal in structure. Tricuspid valve regurgitation is mild . No evidence of tricuspid stenosis. Aortic Valve: The aortic valve is normal in structure. Aortic valve regurgitation is not visualized. Mild aortic stenosis is present. Aortic valve mean gradient measures 9.5 mmHg. Aortic valve peak gradient measures 14.9 mmHg. Aortic valve area, by VTI measures 1.46 cm. Pulmonic Valve: The pulmonic valve was normal in structure. Pulmonic valve regurgitation is not visualized. No evidence of pulmonic stenosis. Aorta: The aortic root is normal in size and structure. Venous: The inferior vena cava is normal in size with greater than 50% respiratory variability, suggesting right atrial pressure of 3 mmHg. IAS/Shunts: No atrial level shunt detected by color flow Doppler.  LEFT VENTRICLE PLAX 2D LVIDd:         2.70 cm   Diastology LVIDs:         1.90 cm   LV e' medial:    5.84 cm/s LV PW:         1.60 cm   LV E/e' medial:  13.7 LV IVS:        1.60 cm   LV e' lateral:   4.30 cm/s LVOT diam:     1.80 cm   LV E/e' lateral: 18.6 LV SV:         54 LV SV Index:   29 LVOT Area:     2.54 cm  RIGHT VENTRICLE RV Basal diam:  4.00 cm RV S prime:     9.57 cm/s TAPSE (M-mode): 2.8 cm LEFT ATRIUM             Index        RIGHT ATRIUM           Index LA diam:        4.00 cm 2.13 cm/m   RA Area:     14.10 cm LA Vol (A2C):   39.7 ml 21.11 ml/m  RA Volume:   37.50 ml  19.94 ml/m LA Vol (A4C):   42.1 ml 22.39 ml/m LA Biplane Vol: 42.6 ml 22.66 ml/m  AORTIC VALVE AV Area (Vmax):    1.35 cm AV Area (Vmean):   1.43 cm AV Area (VTI):     1.46 cm  AV Vmax:           192.75 cm/s AV Vmean:          146.000 cm/s AV VTI:  0.372 m AV Peak Grad:      14.9 mmHg AV Mean Grad:      9.5 mmHg LVOT Vmax:         102.50 cm/s LVOT Vmean:        82.100 cm/s LVOT VTI:          0.213 m LVOT/AV VTI ratio: 0.57  AORTA Ao Root diam: 3.70 cm MITRAL VALVE MV Area (PHT): 2.80 cm     SHUNTS MV Decel Time: 271 msec     Systemic VTI:  0.21 m MV E velocity: 79.90 cm/s   Systemic Diam: 1.80 cm MV A velocity: 115.00 cm/s MV E/A ratio:  0.69 Isaias Cowman MD Electronically signed by Isaias Cowman MD Signature Date/Time: 03/08/2021/1:45:22 PM    Final      Medications:     carvedilol  12.5 mg Oral BID WC   Chlorhexidine Gluconate Cloth  6 each Topical Daily   diphenhydrAMINE       heparin  5,000 Units Subcutaneous Q8H   heparin sodium (porcine)       insulin aspart  0-5 Units Subcutaneous QHS   insulin aspart  0-6 Units Subcutaneous TID WC   lidocaine  1 patch Transdermal Q24H   ondansetron       traZODone  50 mg Oral QPM   venlafaxine  37.5 mg Oral BID WC   acetaminophen **OR** acetaminophen, ondansetron **OR** ondansetron (ZOFRAN) IV, topiramate  Assessment/ Plan:  Katie [name] is a 65 y.o.  female with past medical history including heart failure with preserved EF, chronic back pain chronic hypertension, and end-stage renal disease on hemodialysis. She has been admitted for ESRD (end stage renal disease) ([place]) [N18.6] Frequent falls [R29.6] Recurrent falls [R29.6] Syncope, unspecified syncope type [R55]  [facility]/TTS/Rt Permcath/84 kg   End-stage renal disease on hemodialysis.  Will maintain outpatient schedule, if possible. Receiving dialysis now, tolerating well. Next treatment scheduled for Saturday.   2. Anemia of chronic kidney disease Lab Results  Component Value Date   HGB 10.4 (L) 03/08/2021  No need for ESA's at this time  3. Secondary Hyperparathyroidism: Lab Results  Component Value Date   PTH 70  (H) 05/12/2020   CALCIUM 8.2 (L) 03/08/2021   PHOS 2.7 06/27/2020  Will continue to monitor bone minerals during this admission  4. Chronic diastolic heart failure: ECHO from 03/07/21 shows EF of 60-65%.    LOS: 1 Zaide Mcclenahan 10/27/202210:48 AM

## 2021-03-10 DIAGNOSIS — N186 End stage renal disease: Secondary | ICD-10-CM | POA: Diagnosis not present

## 2021-03-10 DIAGNOSIS — N184 Chronic kidney disease, stage 4 (severe): Secondary | ICD-10-CM | POA: Diagnosis not present

## 2021-03-10 DIAGNOSIS — I10 Essential (primary) hypertension: Secondary | ICD-10-CM | POA: Diagnosis not present

## 2021-03-10 DIAGNOSIS — Z992 Dependence on renal dialysis: Secondary | ICD-10-CM | POA: Diagnosis not present

## 2021-03-10 LAB — GLUCOSE, CAPILLARY
Glucose-Capillary: 90 mg/dL (ref 70–99)
Glucose-Capillary: 121 mg/dL — ABNORMAL HIGH (ref 70–99)
Glucose-Capillary: 104 mg/dL — ABNORMAL HIGH (ref 70–99)
Glucose-Capillary: 106 mg/dL — ABNORMAL HIGH (ref 70–99)

## 2021-03-10 NOTE — Progress Notes (Signed)
PROGRESS NOTE    Katie [name]  JSH:[id number] DOB: Jan 11, 1956 DOA: 03/07/2021 PCP: Patient, [name] (Inactive)    Brief Narrative:  Katie [name] is a 65 y.o. female with medical history significant for hypertension, end-stage renal disease on hemodialysis TuThSa, heart failure preserved ejection fraction, chronic low back pain, presents emergency department for chief concerns of frequent falls and syncope after dialysis.  10/26 bp better. PT rec. Snf 10/27 in dialysis this AM no chest pain, dizziness or hypotension.  Held her carvedilol a.m. dose prior to dialysis. 10/28 becomes dizzy when sits from lying position.  Consultants:  nephrology  Procedures:   Antimicrobials:      Subjective: No sob, cp, or abd pain.   Objective: Vitals:   03/09/21 1631 03/09/21 2004 03/10/21 0440 03/10/21 0759  BP: 132/86 138/70 123/66 96/61  Pulse: 85 75 73 94  Resp: 20 18 16 18   Temp: 98.6 F (37 C) 98.5 F (36.9 C) 98.9 F (37.2 C) 98 F (36.7 C)  TempSrc: Oral Oral Oral   SpO2: 94% 96% 95% 95%  Weight:      Height:        Intake/Output Summary (Last 24 hours) at 03/10/2021 1321 Last data filed at 03/10/2021 0836 Gross per 24 hour  Intake --  Output 200 ml  Net -200 ml   Filed Weights   03/08/21 0918 03/09/21 0816 03/09/21 1218  Weight: 82.6 kg 98 kg 82.7 kg    Examination: Nad, calm Cta no w/r/r Regular s1/s2 no gallop Soft benign +bs No edema [id number]   Data Reviewed: I have personally reviewed following labs and imaging studies  CBC: Recent Labs  Lab 03/06/21 0940 03/07/21 1114 03/08/21 0633  WBC 7.9 8.7 6.2  HGB 11.7* 12.2 10.4*  HCT 35.7* 36.2 31.7*  MCV 99.2 97.6 98.4  PLT 332 316 850   Basic Metabolic Panel: Recent Labs  Lab 03/06/21 0940 03/07/21 1114 03/08/21 0633  NA 137 135 134*  K 4.2 3.3* 4.3  CL 102 97* 99  CO2 20* 26 25  GLUCOSE 104* 107* 94  BUN 48* 15 40*  CREATININE 5.53* 2.68* 4.91*  CALCIUM 8.8* 8.7* 8.2*    GFR: Estimated Creatinine Clearance: 12.9 mL/min (A) (by C-G formula based on SCr of 4.91 mg/dL (H)). Liver Function Tests: Recent Labs  Lab 03/07/21 1114  AST 22  ALT 20  ALKPHOS 94  BILITOT 0.9  PROT 7.5  ALBUMIN 3.6   No results for input(s): LIPASE, AMYLASE in the last 168 hours. No results for input(s): AMMONIA in the last 168 hours. Coagulation Profile: No results for input(s): INR, PROTIME in the last 168 hours. Cardiac Enzymes: No results for input(s): CKTOTAL, CKMB, CKMBINDEX, TROPONINI in the last 168 hours. BNP (last 3 results) No results for input(s): PROBNP in the last 8760 hours. HbA1C: Recent Labs    03/07/21 1419  HGBA1C 5.1   CBG: Recent Labs  Lab 03/09/21 1256 03/09/21 1630 03/09/21 2149 03/10/21 0804 03/10/21 1132  GLUCAP 79 118* 114* 90 121*   Lipid Profile: No results for input(s): CHOL, HDL, LDLCALC, TRIG, CHOLHDL, LDLDIRECT in the last 72 hours. Thyroid Function Tests: Recent Labs    03/07/21 1419  TSH 0.549   Anemia Panel: Recent Labs    03/07/21 1419  VITAMINB12 655   Sepsis Labs: Recent Labs  Lab 03/07/21 1135 03/07/21 1320 03/07/21 1419  PROCALCITON  --  0.56  --   LATICACIDVEN 1.8  --  1.0    Recent Results (  from the past 240 hour(s))  Resp Panel by RT-PCR (Flu A&B, Covid) Nasopharyngeal Swab     Status: None   Collection Time: 03/07/21 12:55 PM   Specimen: Nasopharyngeal Swab; Nasopharyngeal(NP) swabs in vial transport medium  Result Value Ref Range Status   SARS Coronavirus 2 by RT PCR NEGATIVE NEGATIVE Final    Comment: (NOTE) SARS-CoV-2 target nucleic acids are NOT DETECTED.  The SARS-CoV-2 RNA is generally detectable in upper respiratory specimens during the acute phase of infection. The lowest concentration of SARS-CoV-2 viral copies this assay can detect is 138 copies/mL. A negative result does not preclude SARS-Cov-2 infection and should not be used as the sole basis for treatment or other patient  management decisions. A negative result may occur with  improper specimen collection/handling, submission of specimen other than nasopharyngeal swab, presence of viral mutation(s) within the areas targeted by this assay, and inadequate number of viral copies(<138 copies/mL). A negative result must be combined with clinical observations, patient history, and epidemiological information. The expected result is Negative.  Fact Sheet for Patients:  EntrepreneurPulse.com.au  Fact Sheet for Healthcare Providers:  IncredibleEmployment.be  This test is no t yet approved or cleared by the [place] FDA and  has been authorized for detection and/or diagnosis of SARS-CoV-2 by FDA under an Emergency Use Authorization (EUA). This EUA will remain  in effect (meaning this test can be used) for the duration of the COVID-19 declaration under Section 564(b)(1) of the Act, 21 U.S.C.section 360bbb-3(b)(1), unless the authorization is terminated  or revoked sooner.       Influenza A by PCR NEGATIVE NEGATIVE Final   Influenza B by PCR NEGATIVE NEGATIVE Final    Comment: (NOTE) The Xpert Xpress SARS-CoV-2/FLU/RSV plus assay is intended as an aid in the diagnosis of influenza from Nasopharyngeal swab specimens and should not be used as a sole basis for treatment. Nasal washings and aspirates are unacceptable for Xpert Xpress SARS-CoV-2/FLU/RSV testing.  Fact Sheet for Patients: EntrepreneurPulse.com.au  Fact Sheet for Healthcare Providers: IncredibleEmployment.be  This test is not yet approved or cleared by the [place] FDA and has been authorized for detection and/or diagnosis of SARS-CoV-2 by FDA under an Emergency Use Authorization (EUA). This EUA will remain in effect (meaning this test can be used) for the duration of the COVID-19 declaration under Section 564(b)(1) of the Act, 21 U.S.C. section 360bbb-3(b)(1),  unless the authorization is terminated or revoked.  Performed at [facility], [address] [place], [place] [address]          Radiology Studies: No results found.      Scheduled Meds:  carvedilol  12.5 mg Oral BID WC   Chlorhexidine Gluconate Cloth  6 each Topical Daily   heparin  5,000 Units Subcutaneous Q8H   insulin aspart  0-5 Units Subcutaneous QHS   insulin aspart  0-6 Units Subcutaneous TID WC   lidocaine  1 patch Transdermal Q24H   traZODone  50 mg Oral QPM   venlafaxine  37.5 mg Oral BID WC   Continuous Infusions:    Assessment & Plan:   Active Problems:   Hypertension   Type 2 diabetes mellitus (HCC)   Chronic kidney disease (CKD), stage IV (severe) ([place])   Pulmonary hypertension, unspecified ([place])   ESRD on dialysis ([place])   Frequent falls   Homelessness   Financial difficulty   # Increased fall and weakness- Likely secondary to postdialysis treatment and hypotension B12, B1, vitamin D and procalcitonin was ordered for  work-up Echo ordered.  Previous echo on 12/21 revealed normal EF Fall precautions 10/28 PT recommended SNF.  Case management working on placement.  Also needs to change her hemodialysis to [facility].         # End-stage renal disease on hemodialysis - Patient completed hemodialysis session on 03/07/2021 10/28 had dialysis yesterday.  Nephrology following Working on outpatient placement in [place]    # Hypertension- Continue with carvedilol.  Needs to hold a.m. dose prior to dialysis on dialysis days.     # Depression/anxiety-continue venlafaxine       # Insomnia-trazodone 50 mg nightly    # Headaches-topiramate 50 mg p.o. twice daily as needed for headaches   # History of diabetes type 2-presumed improved with end-stage renal disease BG stable Continue RISS    DVT prophylaxis: Heparin Code Status: Full Family Communication: None at bedside Disposition Plan:  Status is:  Inpatient  Remains inpatient appropriate because: Unsafe discharge.  Needs SNF placement.  Case management working on this        LOS: 2 days   Time spent: 35 minutes with more than 50% on [place], MD [facility] Pager [phone]  If 7PM-7AM, please contact night-coverage 03/10/2021, 1:21 PM

## 2021-03-10 NOTE — Plan of Care (Signed)
Continuing with plan of care. 

## 2021-03-10 NOTE — Progress Notes (Signed)
Working on dialysis transfer to [place] for SNF placement.

## 2021-03-10 NOTE — Progress Notes (Signed)
   03/09/21 0915 03/09/21 0930 03/09/21 0945  Vitals  BP 113/70 (!) 106/55 (!) 103/55  MAP (mmHg) 83 70 70  Pulse Rate 73 75 76  Resp 15 14 16   During Hemodialysis Assessment  Blood Flow Rate (mL/min) 400 mL/min 400 mL/min 400 mL/min  Arterial Pressure (mmHg) -150 mmHg 210 mmHg -170 mmHg  Venous Pressure (mmHg) 110 mmHg 110 mmHg 110 mmHg  Transmembrane Pressure (mmHg) 60 mmHg 60 mmHg 60 mmHg  Ultrafiltration Rate (mL/min) 500 mL/min 500 mL/min 500 mL/min  Dialysate Flow Rate (mL/min) 500 ml/min 500 ml/min 500 ml/min  Conductivity: Machine  13.8 13.8 13.8  HD Safety Checks Performed Yes Yes Yes  Intra-Hemodialysis Comments Progressing as prescribed;Tolerated well Progressing as prescribed;Tolerated well Progressing as prescribed;Tolerated well    03/09/21 1000 03/09/21 1015 03/09/21 1030  Vitals  BP (!) 109/58 115/64 (!) 114/59  MAP (mmHg) 74 79 73  Pulse Rate 75 75 74  Resp 14 (!) 21 18  During Hemodialysis Assessment  Blood Flow Rate (mL/min) 400 mL/min 400 mL/min 400 mL/min  Arterial Pressure (mmHg) -170 mmHg -180 mmHg -180 mmHg  Venous Pressure (mmHg) 120 mmHg 120 mmHg 120 mmHg  Transmembrane Pressure (mmHg) 60 mmHg 60 mmHg 60 mmHg  Ultrafiltration Rate (mL/min) 500 mL/min 500 mL/min 500 mL/min  Dialysate Flow Rate (mL/min) 500 ml/min 500 ml/min 500 ml/min  Conductivity: Machine  13.8 13.8 13.8  HD Safety Checks Performed Yes Yes Yes  Intra-Hemodialysis Comments Progressing as prescribed;Tolerated well Progressing as prescribed;Tolerated well Progressing as prescribed;Tolerated well

## 2021-03-10 NOTE — Progress Notes (Signed)
[facility]  ROUNDING NOTE   Subjective:   Katie [name] is a 65 year old female with past medical history including heart failure with preserved EF, chronic back pain chronic hypertension, and end-stage renal disease on hemodialysis. She has been admitted for ESRD (end stage renal disease) ([place]) [N18.6] Frequent falls [R29.6] Recurrent falls [R29.6] Syncope, unspecified syncope type [R55]   Patient is known to our clinic from prior admissions.  Patient receives outpatient dialysis treatments at [facility], supervised by Dr. Holley Raring.    Patient seen resting in bed, alert and oriented Completed breakfast tray at bedside, denies nausea and vomiting Denies shortness of breath, pain or discomfort.  Weaned to room air Dialysis yesterday, tolerated well  Objective:  Vital signs in last 24 hours:  Temp:  [98 F (36.7 C)-98.9 F (37.2 C)] 98 F (36.7 C) (10/28 0759) Pulse Rate:  [73-94] 94 (10/28 0759) Resp:  [15-20] 18 (10/28 0759) BP: (96-138)/(61-86) 96/61 (10/28 0759) SpO2:  [94 %-96 %] 95 % (10/28 0759) Weight:  [82.7 kg] 82.7 kg (10/27 1218)  Weight change: 15.4 kg Filed Weights   03/08/21 0918 03/09/21 0816 03/09/21 1218  Weight: 82.6 kg 98 kg 82.7 kg    Intake/Output: I/O last 3 completed shifts: In: 0  Out: 635 [Other:635]   Intake/Output this shift:  Total I/O In: -  Out: 200 [Urine:200]  Physical Exam: General: NAD, laying in bed  Head: Normocephalic, atraumatic. Moist oral mucosal membranes  Eyes: Anicteric  Lungs:  Clear to auscultation, normal breathing  Heart: Regular rate and rhythm  Abdomen:  Soft, nontender  Extremities:  trace peripheral edema.  Neurologic: Nonfocal, moving all four extremities  Skin: No lesions  Access: Rt chest Permcath    Basic Metabolic Panel: Recent Labs  Lab 03/06/21 0940 03/07/21 1114 03/08/21 0633  NA 137 135 134*  K 4.2 3.3* 4.3  CL 102 97* 99  CO2 20* 26 25  GLUCOSE 104* 107* 94  BUN 48*  15 40*  CREATININE 5.53* 2.68* 4.91*  CALCIUM 8.8* 8.7* 8.2*     Liver Function Tests: Recent Labs  Lab 03/07/21 1114  AST 22  ALT 20  ALKPHOS 94  BILITOT 0.9  PROT 7.5  ALBUMIN 3.6    No results for input(s): LIPASE, AMYLASE in the last 168 hours. No results for input(s): AMMONIA in the last 168 hours.  CBC: Recent Labs  Lab 03/06/21 0940 03/07/21 1114 03/08/21 0633  WBC 7.9 8.7 6.2  HGB 11.7* 12.2 10.4*  HCT 35.7* 36.2 31.7*  MCV 99.2 97.6 98.4  PLT 332 316 277     Cardiac Enzymes: No results for input(s): CKTOTAL, CKMB, CKMBINDEX, TROPONINI in the last 168 hours.  BNP: Invalid input(s): POCBNP  CBG: Recent Labs  Lab 03/09/21 0748 03/09/21 1256 03/09/21 1630 03/09/21 2149 03/10/21 0804  GLUCAP 108* 79 118* 114* 90     Microbiology: Results for orders placed or performed during the hospital encounter of 03/07/21  Resp Panel by RT-PCR (Flu A&B, Covid) Nasopharyngeal Swab     Status: None   Collection Time: 03/07/21 12:55 PM   Specimen: Nasopharyngeal Swab; Nasopharyngeal(NP) swabs in vial transport medium  Result Value Ref Range Status   SARS Coronavirus 2 by RT PCR NEGATIVE NEGATIVE Final    Comment: (NOTE) SARS-CoV-2 target nucleic acids are NOT DETECTED.  The SARS-CoV-2 RNA is generally detectable in upper respiratory specimens during the acute phase of infection. The lowest concentration of SARS-CoV-2 viral copies this assay can detect is 138  copies/mL. A negative result does not preclude SARS-Cov-2 infection and should not be used as the sole basis for treatment or other patient management decisions. A negative result may occur with  improper specimen collection/handling, submission of specimen other than nasopharyngeal swab, presence of viral mutation(s) within the areas targeted by this assay, and inadequate number of viral copies(<138 copies/mL). A negative result must be combined with clinical observations, patient history, and  epidemiological information. The expected result is Negative.  Fact Sheet for Patients:  EntrepreneurPulse.com.au  Fact Sheet for Healthcare Providers:  IncredibleEmployment.be  This test is no t yet approved or cleared by the [place] FDA and  has been authorized for detection and/or diagnosis of SARS-CoV-2 by FDA under an Emergency Use Authorization (EUA). This EUA will remain  in effect (meaning this test can be used) for the duration of the COVID-19 declaration under Section 564(b)(1) of the Act, 21 U.S.C.section 360bbb-3(b)(1), unless the authorization is terminated  or revoked sooner.       Influenza A by PCR NEGATIVE NEGATIVE Final   Influenza B by PCR NEGATIVE NEGATIVE Final    Comment: (NOTE) The Xpert Xpress SARS-CoV-2/FLU/RSV plus assay is intended as an aid in the diagnosis of influenza from Nasopharyngeal swab specimens and should not be used as a sole basis for treatment. Nasal washings and aspirates are unacceptable for Xpert Xpress SARS-CoV-2/FLU/RSV testing.  Fact Sheet for Patients: EntrepreneurPulse.com.au  Fact Sheet for Healthcare Providers: IncredibleEmployment.be  This test is not yet approved or cleared by the [place] FDA and has been authorized for detection and/or diagnosis of SARS-CoV-2 by FDA under an Emergency Use Authorization (EUA). This EUA will remain in effect (meaning this test can be used) for the duration of the COVID-19 declaration under Section 564(b)(1) of the Act, 21 U.S.C. section 360bbb-3(b)(1), unless the authorization is terminated or revoked.  Performed at [facility], [address] [place], [place] [address]     Coagulation Studies: No results for input(s): LABPROT, INR in the last 72 hours.  Urinalysis: No results for input(s): COLORURINE, LABSPEC, PHURINE, GLUCOSEU, HGBUR, BILIRUBINUR, KETONESUR, PROTEINUR, UROBILINOGEN,  NITRITE, LEUKOCYTESUR in the last 72 hours.  Invalid input(s): APPERANCEUR    Imaging: No results found.   Medications:     carvedilol  12.5 mg Oral BID WC   Chlorhexidine Gluconate Cloth  6 each Topical Daily   heparin  5,000 Units Subcutaneous Q8H   insulin aspart  0-5 Units Subcutaneous QHS   insulin aspart  0-6 Units Subcutaneous TID WC   lidocaine  1 patch Transdermal Q24H   traZODone  50 mg Oral QPM   venlafaxine  37.5 mg Oral BID WC   acetaminophen **OR** acetaminophen, ondansetron **OR** ondansetron (ZOFRAN) IV, oxyCODONE, topiramate  Assessment/ Plan:  [name] is a 65 y.o.  female with past medical history including heart failure with preserved EF, chronic back pain chronic hypertension, and end-stage renal disease on hemodialysis. She has been admitted for ESRD (end stage renal disease) ([place]) [N18.6] Frequent falls [R29.6] Recurrent falls [R29.6] Syncope, unspecified syncope type [R55]  [facility]/TTS/Rt Permcath/84 kg   End-stage renal disease on hemodialysis.  Will maintain outpatient schedule, if possible.  Received dialysis yesterday, tolerated well.  UF 635 mL achieved.  Next treatment scheduled for Saturday.   2. Anemia of chronic kidney disease Lab Results  Component Value Date   HGB 10.4 (L) 03/08/2021  Hemoglobin at target.  No need for ESA's at this time  3. Secondary Hyperparathyroidism: Lab  Results  Component Value Date   PTH 70 (H) 05/12/2020   CALCIUM 8.2 (L) 03/08/2021   PHOS 2.7 06/27/2020   Outpatient reports indicate elevated phosphorus 10.4 on 03/07/2021 and calcium in target on 02/21/2021. Will continue to monitor bone minerals during this admission  4. Chronic diastolic heart failure: ECHO from 03/07/21 shows EF of 60-65%.    LOS: 2 Blanch Stang 10/28/202211:19 AM

## 2021-03-10 NOTE — Progress Notes (Signed)
   03/09/21 1045 03/09/21 1100 03/09/21 1115  Vitals  BP (!) 107/59 123/63 108/60  MAP (mmHg) 73 82 75  Pulse Rate 75 76 76  Resp 12 13 12   During Hemodialysis Assessment  Blood Flow Rate (mL/min) 400 mL/min 400 mL/min 400 mL/min  Arterial Pressure (mmHg) -180 mmHg -180 mmHg -180 mmHg  Venous Pressure (mmHg) 130 mmHg 140 mmHg 140 mmHg  Transmembrane Pressure (mmHg) 50 mmHg 50 mmHg 50 mmHg  Ultrafiltration Rate (mL/min) 500 mL/min 500 mL/min 500 mL/min  Dialysate Flow Rate (mL/min) 500 ml/min 500 ml/min 500 ml/min  Conductivity: Machine  13.8 13.8 13.8  HD Safety Checks Performed Yes Yes Yes  Intra-Hemodialysis Comments Progressing as prescribed;Tolerated well Progressing as prescribed;Tolerated well Progressing as prescribed;Tolerated well    03/09/21 1125  Vitals  BP  --   MAP (mmHg)  --   Pulse Rate  --   Resp  --   During Hemodialysis Assessment  Blood Flow Rate (mL/min)  --   Arterial Pressure (mmHg)  --   Venous Pressure (mmHg)  --   Transmembrane Pressure (mmHg)  --   Ultrafiltration Rate (mL/min)  --   Dialysate Flow Rate (mL/min)  --   Conductivity: Machine   --   HD Safety Checks Performed  --   Intra-Hemodialysis Comments Tx completed

## 2021-03-10 NOTE — Progress Notes (Signed)
Occupational Therapy Evaluation Patient Details Name: Katie [name] MRN: [id number] DOB: June 09, 1955 Today's Date: 03/10/2021   History of Present Illness Katie [name] is a 65 y.o. female with medical history significant for hypertension, DM, end-stage renal disease on hemodialysis TuThSa, heart failure preserved ejection fraction, chronic low back pain, presents emergency department for chief concerns of frequent falls and syncope after dialysis. Katie [name] struggles with homelessness and finanacial difficulty. MD assessment includes: increased falls and weakness, depression/anxiety, insominia, and HAs.   Clinical Impression   Katie [name] was seen for OT evaluation this date. Prior to hospital admission, Katie [name] was living with and receiving help from friend as needed, but Katie [name] will be unable to return to this living situation. Currently Katie [name] demonstrates impairments as described below (See OT problem list) which functionally limit her ability to perform ADL/self-care tasks. Katie [name] currently requires MOD A for LBD, don/doff socks seated EOB. GCA for perihygeine, standing. Katie [name] close supervison for UB grooming, seated EOB ~ 77min. Orthostatics - 146/67 (map 91) supine, 108/52 (map 68) sitting EOB, 82/45 (map 58) standing. MD requested TED hose for Katie [name] during session, prior to attempted stand.Katie [name] unable to tolerate standing >1 min, sitting rapidly after attempted stand.  Katie [name] would benefit from skilled OT services to address noted impairments and functional limitations (see below for any additional details) in order to maximize safety and independence while minimizing falls risk and caregiver burden. Upon hospital discharge, recommend STR to maximize Katie [name] safety and return to PLOF.       Recommendations for follow up therapy are one component of a multi-disciplinary discharge planning process, led by the attending physician.  Recommendations may be updated based on patient status, additional functional criteria and insurance authorization.    Follow Up Recommendations  Skilled nursing-short term rehab (<3 hours/day)    Assistance Recommended at Discharge Intermittent Supervision/Assistance  Functional Status Assessment  Patient has had a recent decline in their functional status and demonstrates the ability to make significant improvements in function in a reasonable and predictable amount of time.  Equipment Recommendations   (Defer to next venue of care)    Recommendations for Other Services       Precautions / Restrictions Precautions Precautions: Fall Restrictions Weight Bearing Restrictions: No      Mobility Bed Mobility Overal bed mobility: Needs Assistance Bed Mobility: Supine to Sit;Sit to Supine     Supine to sit: Supervision;HOB elevated Sit to supine: Supervision;HOB elevated        Transfers Overall transfer level: Needs assistance Equipment used: Rolling walker (2 wheels) Transfers: Sit to/from Stand;Lateral/Scoot Transfers Sit to Stand: Min guard          Lateral/Scoot Transfers: Supervision        Balance Overall balance assessment: Needs assistance Sitting-balance support: Feet supported;No upper extremity supported Sitting balance-Leahy Scale: Good     Standing balance support: Single extremity supported;During functional activity Standing balance-Leahy Scale: Fair                             ADL either performed or assessed with clinical judgement   ADL Overall ADL's : Needs assistance/impaired                                       General ADL Comments: Katie [name] requires MOD A for LBD, don/doff socks seated EOB. GCA for perihygeine, standing. Katie [name] close supervison for  UB grooming, seated EOB ~ 71min.     Vision Baseline Vision/History:  (Katie [name] reports visual impairments at baseline) Patient Visual Report: No change from baseline       Perception     Praxis      Pertinent Vitals/Pain Pain Assessment: 0-10 Pain Score: 7  Pain Location:  back Pain Descriptors / Indicators: Aching;Sore Pain Intervention(s): Patient requesting pain meds-RN notified     Hand Dominance     Extremity/Trunk Assessment Upper Extremity Assessment Upper Extremity Assessment: Generalized weakness   Lower Extremity Assessment Lower Extremity Assessment: Generalized weakness       Communication Communication Communication: No difficulties   Cognition Arousal/Alertness: Awake/alert Behavior During Therapy: WFL for tasks assessed/performed Overall Cognitive Status: Within Functional Limits for tasks assessed                                       General Comments  Othrostatics- 146/67 (MAP 91) supine, 108/52 (map 68) sitting EOB, 82/45 (Map 58) standing.    Exercises Exercises: Other exercises Other Exercises Other Exercises: Katie [name] educ re: role of OT, d/c recs, adaptive dressing, DME use, falls pcns Other Exercises: Katie [name] sup<>sit, sit<>stand, UB grooming, LB dressing, perihygeine, orthostatics   Shoulder Instructions      Home Living Family/patient expects to be discharged to:: Private residence Living Arrangements: Non-relatives/Friends   Type of Home: House Home Access: Stairs to enter     Home Layout: One level                   Additional Comments: Home setup per chart. Katie [name] reports living with friend who cares for Katie [name] + friend's husband, however friend currently sick and unable to assist.      Prior Functioning/Environment Prior Level of Function : Needs assist       Physical Assist : ADLs (physical)   ADLs (physical): Bathing;Dressing   ADLs Comments: Friend assisted with bathing and dressing as needed        OT Problem List: Decreased strength;Decreased activity tolerance;Impaired balance (sitting and/or standing)      OT Treatment/Interventions: Self-care/ADL training;Therapeutic exercise;Energy conservation;DME and/or AE instruction;Therapeutic activities    OT Goals(Current goals can be  found in the care plan section) Acute Rehab OT Goals Patient Stated Goal: to get better OT Goal Formulation: With patient Time For Goal Achievement: 03/24/21 Potential to Achieve Goals: Good ADL Goals Katie [name] Will Perform Grooming: standing;with supervision Katie [name] Will Perform Lower Body Dressing: sit to/from stand;with supervision Katie [name] Will Transfer to Toilet: ambulating;with supervision;regular height toilet  OT Frequency: Min 1X/week   Barriers to D/C: Decreased caregiver support  Friend not able to help at this time       Co-evaluation              AM-PAC OT "6 Clicks" Daily Activity     Outcome Measure Help from another person eating meals?: None Help from another person taking care of personal grooming?: A Little Help from another person toileting, which includes using toliet, bedpan, or urinal?: A Lot Help from another person bathing (including washing, rinsing, drying)?: A Little Help from another person to put on and taking off regular upper body clothing?: A Little Help from another person to put on and taking off regular lower body clothing?: A Lot 6 Click Score: 17   End of Session Equipment Utilized During Treatment: Rolling walker (2 wheels) Nurse Communication: Patient requests pain  meds  Activity Tolerance: Patient tolerated treatment well Patient left: in bed;with call bell/phone within reach;with bed alarm set  OT Visit Diagnosis: Unsteadiness on feet (R26.81);Repeated falls (R29.6);Muscle weakness (generalized) (M62.81);History of falling (Z91.81)                Time: 6004-5997 OT Time Calculation (min): 44 min Charges:  OT General Charges $OT Visit: 1 Visit OT Evaluation $OT Eval Moderate Complexity: 1 Mod OT Treatments $Self Care/Home Management : 23-37 mins  Nino Glow, Markus Daft 03/10/2021, 11:10 AM

## 2021-03-10 NOTE — Progress Notes (Signed)
Physical Therapy Treatment Patient Details Name: Katie [name] MRN: [id number] DOB: 1955/10/03 Today's Date: 03/10/2021   History of Present Illness Pt is a 65 y.o. female with medical history significant for hypertension, DM, end-stage renal disease on hemodialysis TuThSa, heart failure preserved ejection fraction, chronic low back pain, presents emergency department for chief concerns of frequent falls and syncope after dialysis. Pt struggles with homelessness and finanacial difficulty. MD assessment includes: increased falls and weakness, depression/anxiety, insominia, and HAs.    PT Comments    Pt was pleasant and motivated to participate during the session and put forth good effort throughout. Pt was able to sit supine and stand with CGA. Orthostatics included: 161/57 supine, 138/58 in sitting x 0 mins, 124/53 in sitting x 5 mins, and 81/52 standing x 0 mins. Pt was able to complete bed and EOB exercises with no problem and no symptoms, but was  lightheaded and dizzy upon standing. BP returned to 155/70 with 79 HR once lying back in bed. Pt will benefit from PT services in a SNF setting upon discharge to safely address deficits listed in patient problem list for decreased caregiver assistance and eventual return to PLOF.     Recommendations for follow up therapy are one component of a multi-disciplinary discharge planning process, led by the attending physician.  Recommendations may be updated based on patient status, additional functional criteria and insurance authorization.  Follow Up Recommendations  Skilled nursing-short term rehab (<3 hours/day)     Assistance Recommended at Discharge Intermittent Supervision/Assistance  Equipment Recommendations  None recommended by PT    Recommendations for Other Services       Precautions / Restrictions Precautions Precautions: Fall Restrictions Weight Bearing Restrictions: No Other Position/Activity Restrictions: Watch orthostatics      Mobility  Bed Mobility Overal bed mobility: Modified Independent Bed Mobility: Supine to Sit;Sit to Supine     Supine to sit: Supervision;HOB elevated Sit to supine: Supervision;HOB elevated   General bed mobility comments: Extra time and effort    Transfers Overall transfer level: Needs assistance Equipment used: Rolling walker (2 wheels) Transfers: Sit to/from Stand Sit to Stand: Min guard           General transfer comment: Extra time and effort    Ambulation/Gait Ambulation/Gait assistance: Min guard Gait Distance (Feet): 2 Feet Assistive device: Rolling walker (2 wheels) Gait Pattern/deviations: Step-to pattern;Decreased step length - right;Decreased step length - left Gait velocity: decreased   General Gait Details: Side stepping beside bed   Stairs             Wheelchair Mobility    Modified Rankin (Stroke Patients Only)       Balance Overall balance assessment: Needs assistance Sitting-balance support: Bilateral upper extremity supported;Feet supported Sitting balance-Leahy Scale: Good     Standing balance support: Bilateral upper extremity supported;During functional activity Standing balance-Leahy Scale: Fair Standing balance comment: Use of RW for minor stability                            Cognition Arousal/Alertness: Awake/alert Behavior During Therapy: WFL for tasks assessed/performed Overall Cognitive Status: Within Functional Limits for tasks assessed                                          Exercises Total Joint Exercises Ankle Circles/Pumps: AROM;Strengthening;Both;10 reps Quad Sets: AROM;Strengthening;Both;15 reps Gluteal  Sets: AROM;Strengthening;Both;10 reps Towel Squeeze: AROM;Strengthening;Both;10 reps Long Arc Quad: AROM;Strengthening;Both;10 reps Other Exercises Other Exercises: EOB marches, AROM, strength, both, 10 reps Other Exercises: BP education    General Comments         Pertinent Vitals/Pain Pain Assessment: 0-10 Pain Score: 6  Pain Location: back Pain Descriptors / Indicators: Aching;Sore Pain Intervention(s): Repositioned    Home Living                          Prior Function            PT Goals (current goals can now be found in the care plan section) Acute Rehab PT Goals Patient Stated Goal: Improved strength and balance and return to independence PT Goal Formulation: With patient Potential to Achieve Goals: Good Progress towards PT goals: PT to reassess next treatment    Frequency    Min 2X/week      PT Plan Current plan remains appropriate    Co-evaluation              AM-PAC PT "6 Clicks" Mobility   Outcome Measure  Help needed turning from your back to your side while in a flat bed without using bedrails?: A Little Help needed moving from lying on your back to sitting on the side of a flat bed without using bedrails?: A Little Help needed moving to and from a bed to a chair (including a wheelchair)?: A Little Help needed standing up from a chair using your arms (e.g., wheelchair or bedside chair)?: A Little Help needed to walk in hospital room?: A Lot Help needed climbing 3-5 steps with a railing? : A Lot 6 Click Score: 16    End of Session Equipment Utilized During Treatment: Gait belt Activity Tolerance: Other (comment) Patient left: in bed;with call bell/phone within reach;with bed alarm set Nurse Communication: Mobility status (Updated on orthostatics) PT Visit Diagnosis: Unsteadiness on feet (R26.81);History of falling (Z91.81);Muscle weakness (generalized) (M62.81);Difficulty in walking, not elsewhere classified (R26.2);Pain     Time: 1451-1531 PT Time Calculation (min) (ACUTE ONLY): 40 min  Charges:                        Sheldon Silvan SPT 03/10/21, 4:24 PM

## 2021-03-10 NOTE — TOC Progression Note (Signed)
Transition of Care [facility]) - Progression Note    Patient Details  Name: Katie [name] MRN: [id number] Date of Birth: 03/17/1956  Transition [facility]) CM/SW Contact  Beverly Sessions, RN Phone Number: 03/10/2021, 12:17 PM  Clinical Narrative:      OT has evaluated patient Katie [name] notified at [place] and he is to start [place] today [name] HD liaison working switching HD center Expected Discharge Plan: [place] Barriers to Discharge: Continued Medical Work up  Expected Discharge Plan and Services Expected Discharge Plan: [place] arrangements for the past 2 months: Single Family Home                                       Social Determinants of Health (SDOH) Interventions    Readmission Risk Interventions Readmission Risk Prevention Plan 03/08/2021  Transportation Screening Complete  Medication Review Press photographer) Complete  HRI or [place] Complete

## 2021-03-11 DIAGNOSIS — Z599 Problem related to housing and economic circumstances, unspecified: Secondary | ICD-10-CM | POA: Diagnosis not present

## 2021-03-11 DIAGNOSIS — N186 End stage renal disease: Secondary | ICD-10-CM | POA: Diagnosis not present

## 2021-03-11 DIAGNOSIS — R296 Repeated falls: Secondary | ICD-10-CM | POA: Diagnosis not present

## 2021-03-11 DIAGNOSIS — N184 Chronic kidney disease, stage 4 (severe): Secondary | ICD-10-CM | POA: Diagnosis not present

## 2021-03-11 LAB — GLUCOSE, CAPILLARY
Glucose-Capillary: 81 mg/dL (ref 70–99)
Glucose-Capillary: 82 mg/dL (ref 70–99)
Glucose-Capillary: 82 mg/dL (ref 70–99)
Glucose-Capillary: 123 mg/dL — ABNORMAL HIGH (ref 70–99)

## 2021-03-11 MED ORDER — ONDANSETRON HCL 4 MG/2ML IJ SOLN
INTRAMUSCULAR | Status: AC
  Filled 2021-03-11: qty 2

## 2021-03-11 MED ORDER — ACETAMINOPHEN 325 MG PO TABS
ORAL_TABLET | ORAL | Status: AC
  Administered 2021-03-11: 650 mg
  Filled 2021-03-11: qty 2

## 2021-03-11 NOTE — Progress Notes (Addendum)
[facility]  ROUNDING NOTE   Subjective:   Katie [name] is a 65 year old female with past medical history including heart failure with preserved EF, chronic back pain chronic hypertension, and end-stage renal disease on hemodialysis. She has been admitted for ESRD (end stage renal disease) ([place]) [N18.6] Frequent falls [R29.6] Recurrent falls [R29.6] Syncope, unspecified syncope type [R55]   Patient found resting in bed, in no acute distress. She reports good appetite, no nausea or vomiting, denies SOB. She is awaiting SNF placement.  Objective:  Vital signs in last 24 hours:  Temp:  [98.3 F (36.8 C)-98.7 F (37.1 C)] 98.7 F (37.1 C) (10/29 0814) Pulse Rate:  [72-78] 73 (10/29 0814) Resp:  [12-17] 12 (10/29 0814) BP: (144-149)/(62-70) 144/65 (10/29 0814) SpO2:  [96 %-98 %] 96 % (10/29 0814)  Weight change:  Filed Weights   03/08/21 0918 03/09/21 0816 03/09/21 1218  Weight: 82.6 kg 98 kg 82.7 kg    Intake/Output: I/O last 3 completed shifts: In: 120 [P.O.:120] Out: 500 [Urine:500]   Intake/Output this shift:  No intake/output data recorded.  Physical Exam: General: Resting in bed, in no acute distress  Head:  Moist oral mucosal membranes  Lungs:  Respirations even,unlabored,lungs clear  Heart: S1S2, no rubs or gallops  Abdomen:  Soft, nontender,non distended  Extremities:  trace peripheral edema.  Neurologic: Awake,alert,oriented  Skin: No acute  lesions or rashes  Access: Rt chest Permcath    Basic Metabolic Panel: Recent Labs  Lab 03/06/21 0940 03/07/21 1114 03/08/21 0633  NA 137 135 134*  K 4.2 3.3* 4.3  CL 102 97* 99  CO2 20* 26 25  GLUCOSE 104* 107* 94  BUN 48* 15 40*  CREATININE 5.53* 2.68* 4.91*  CALCIUM 8.8* 8.7* 8.2*     Liver Function Tests: Recent Labs  Lab 03/07/21 1114  AST 22  ALT 20  ALKPHOS 94  BILITOT 0.9  PROT 7.5  ALBUMIN 3.6    No results for input(s): LIPASE, AMYLASE in the last 168 hours. No results  for input(s): AMMONIA in the last 168 hours.  CBC: Recent Labs  Lab 03/06/21 0940 03/07/21 1114 03/08/21 0633  WBC 7.9 8.7 6.2  HGB 11.7* 12.2 10.4*  HCT 35.7* 36.2 31.7*  MCV 99.2 97.6 98.4  PLT 332 316 277     Cardiac Enzymes: No results for input(s): CKTOTAL, CKMB, CKMBINDEX, TROPONINI in the last 168 hours.  BNP: Invalid input(s): POCBNP  CBG: Recent Labs  Lab 03/10/21 1132 03/10/21 1551 03/10/21 2129 03/11/21 0815 03/11/21 1158  GLUCAP 121* 104* 106* 81 82     Microbiology: Results for orders placed or performed during the hospital encounter of 03/07/21  Resp Panel by RT-PCR (Flu A&B, Covid) Nasopharyngeal Swab     Status: None   Collection Time: 03/07/21 12:55 PM   Specimen: Nasopharyngeal Swab; Nasopharyngeal(NP) swabs in vial transport medium  Result Value Ref Range Status   SARS Coronavirus 2 by RT PCR NEGATIVE NEGATIVE Final    Comment: (NOTE) SARS-CoV-2 target nucleic acids are NOT DETECTED.  The SARS-CoV-2 RNA is generally detectable in upper respiratory specimens during the acute phase of infection. The lowest concentration of SARS-CoV-2 viral copies this assay can detect is 138 copies/mL. A negative result does not preclude SARS-Cov-2 infection and should not be used as the sole basis for treatment or other patient management decisions. A negative result may occur with  improper specimen collection/handling, submission of specimen other than nasopharyngeal swab, presence of viral mutation(s) within the areas  targeted by this assay, and inadequate number of viral copies(<138 copies/mL). A negative result must be combined with clinical observations, patient history, and epidemiological information. The expected result is Negative.  Fact Sheet for Patients:  EntrepreneurPulse.com.au  Fact Sheet for Healthcare Providers:  IncredibleEmployment.be  This test is no t yet approved or cleared by the [place]  FDA and  has been authorized for detection and/or diagnosis of SARS-CoV-2 by FDA under an Emergency Use Authorization (EUA). This EUA will remain  in effect (meaning this test can be used) for the duration of the COVID-19 declaration under Section 564(b)(1) of the Act, 21 U.S.C.section 360bbb-3(b)(1), unless the authorization is terminated  or revoked sooner.       Influenza A by PCR NEGATIVE NEGATIVE Final   Influenza B by PCR NEGATIVE NEGATIVE Final    Comment: (NOTE) The Xpert Xpress SARS-CoV-2/FLU/RSV plus assay is intended as an aid in the diagnosis of influenza from Nasopharyngeal swab specimens and should not be used as a sole basis for treatment. Nasal washings and aspirates are unacceptable for Xpert Xpress SARS-CoV-2/FLU/RSV testing.  Fact Sheet for Patients: EntrepreneurPulse.com.au  Fact Sheet for Healthcare Providers: IncredibleEmployment.be  This test is not yet approved or cleared by the [place] FDA and has been authorized for detection and/or diagnosis of SARS-CoV-2 by FDA under an Emergency Use Authorization (EUA). This EUA will remain in effect (meaning this test can be used) for the duration of the COVID-19 declaration under Section 564(b)(1) of the Act, 21 U.S.C. section 360bbb-3(b)(1), unless the authorization is terminated or revoked.  Performed at [facility], [address] [place], [place] [address]     Coagulation Studies: No results for input(s): LABPROT, INR in the last 72 hours.  Urinalysis: No results for input(s): COLORURINE, LABSPEC, PHURINE, GLUCOSEU, HGBUR, BILIRUBINUR, KETONESUR, PROTEINUR, UROBILINOGEN, NITRITE, LEUKOCYTESUR in the last 72 hours.  Invalid input(s): APPERANCEUR    Imaging: No results found.   Medications:     carvedilol  12.5 mg Oral BID WC   Chlorhexidine Gluconate Cloth  6 each Topical Daily   heparin  5,000 Units Subcutaneous Q8H   insulin aspart  0-5  Units Subcutaneous QHS   insulin aspart  0-6 Units Subcutaneous TID WC   lidocaine  1 patch Transdermal Q24H   traZODone  50 mg Oral QPM   venlafaxine  37.5 mg Oral BID WC   ondansetron **OR** ondansetron (ZOFRAN) IV, oxyCODONE, topiramate  Assessment/ Plan:  Katie [name] is a 65 y.o.  female with past medical history including heart failure with preserved EF, chronic back pain chronic hypertension, and end-stage renal disease on hemodialysis. She has been admitted for ESRD (end stage renal disease) ([place]) [N18.6] Frequent falls [R29.6] Recurrent falls [R29.6] Syncope, unspecified syncope type [R55]  [facility]/TTS/Rt Permcath/84 kg  #End-stage renal disease on hemodialysis.   Planning for HD later today Patient still has Rt. Chest Permcath as access for HD Discussed about fistula placement   #Anemia of chronic kidney disease Lab Results  Component Value Date   HGB 10.4 (L) 03/08/2021  Will continue monitoring CBCs  #Secondary Hyperparathyroidism: Lab Results  Component Value Date   PTH 70 (H) 05/12/2020   CALCIUM 8.2 (L) 03/08/2021   PHOS 2.7 06/27/2020  Will continue monitoring bone mineral metabolism parameters  #Chronic diastolic heart failure: ECHO from 03/07/21 shows EF of 60-65%. No acute respiratory distress noted  #Diabetes with CKD Lab Results  Component Value Date   HGBA1C 5.1 03/07/2021  Continue management  per primary team   LOS: 3 Trachelle Low 10/29/202212:03 PM

## 2021-03-11 NOTE — Progress Notes (Signed)
Mobility Specialist - Progress Note   03/11/21 1312  Orthostatic Lying   BP- Lying 155/72  Orthostatic Sitting  BP- Sitting 123/84  Orthostatic Standing at 0 minutes  BP- Standing at 0 minutes (!) 79/58  Pulse- Standing at 0 minutes 85  Mobility  Activity Stood at bedside;Dangled on edge of bed  Level of Assistance Standby assist, set-up cues, supervision of patient - no hands on  Assistive Device Front wheel walker  Distance Ambulated (ft) 0 ft  Mobility Sit up in bed/chair position for meals  Mobility Response Tolerated well  Mobility performed by Mobility specialist  $Mobility charge 1 Mobility    Pt lying in bed upon arrival. Supervision and extra time for transfers. Orthostatics recorded above, symptomatic with blurred vision. BP improved to 161/74 once returned supine. SOB on RA, O2 > 95%.    Kathee Delton Mobility Specialist 03/11/21, 1:17 PM

## 2021-03-11 NOTE — Progress Notes (Signed)
PROGRESS NOTE    Katie [name]  XIP:[id number] DOB: [birth year] DOA: 03/07/2021 PCP: Patient, [name] (Inactive)    Brief Narrative:  Ruthene Methvin is a 65 y.o. female with medical history significant for hypertension, end-stage renal disease on hemodialysis [facility], heart failure preserved ejection fraction, chronic low back pain, presents emergency department for chief concerns of frequent falls and syncope after dialysis.  10/26 bp better. PT rec. Snf 10/27 in dialysis this AM no chest pain, dizziness or hypotension.  Held her carvedilol a.m. dose prior to dialysis. 10/29 some dizziness no other complaints  Consultants:  nephrology  Procedures:   Antimicrobials:      Subjective: No shortness of breath, chest pain, abdominal pain   Objective: Vitals:   03/10/21 1550 03/10/21 2013 03/11/21 0451 03/11/21 0814  BP: (!) 149/62 (!) 144/64 (!) 144/70 (!) 144/65  Pulse: 78 72 72 73  Resp: 17 16 16 12   Temp: 98.6 F (37 C) 98.3 F (36.8 C) 98.3 F (36.8 C) 98.7 F (37.1 C)  TempSrc:  Oral Oral Oral  SpO2: 96% 97% 98% 96%  Weight:      Height:        Intake/Output Summary (Last 24 hours) at 03/11/2021 0838 Last data filed at 03/11/2021 0453 Gross per 24 hour  Intake 120 ml  Output 300 ml  Net -180 ml   Filed Weights   03/08/21 0918 03/09/21 0816 03/09/21 1218  Weight: 82.6 kg 98 kg 82.7 kg    Examination: NAD, lying in bed CTA no wheezing RRR S1-S2 no gallops Soft benign positive bowel sounds No edema Awake and alert oriented x3 Mood and affect appropriate in current setting  Data Reviewed: I have personally reviewed following labs and imaging studies  CBC: Recent Labs  Lab 03/06/21 0940 03/07/21 1114 03/08/21 0633  WBC 7.9 8.7 6.2  HGB 11.7* 12.2 10.4*  HCT 35.7* 36.2 31.7*  MCV 99.2 97.6 98.4  PLT 332 316 673   Basic Metabolic Panel: Recent Labs  Lab 03/06/21 0940 03/07/21 1114 03/08/21 0633  NA 137 135 134*  K 4.2 3.3* 4.3  CL 102  97* 99  CO2 20* 26 25  GLUCOSE 104* 107* 94  BUN 48* 15 40*  CREATININE 5.53* 2.68* 4.91*  CALCIUM 8.8* 8.7* 8.2*   GFR: Estimated Creatinine Clearance: 12.9 mL/min (A) (by C-G formula based on SCr of 4.91 mg/dL (H)). Liver Function Tests: Recent Labs  Lab 03/07/21 1114  AST 22  ALT 20  ALKPHOS 94  BILITOT 0.9  PROT 7.5  ALBUMIN 3.6   No results for input(s): LIPASE, AMYLASE in the last 168 hours. No results for input(s): AMMONIA in the last 168 hours. Coagulation Profile: No results for input(s): INR, PROTIME in the last 168 hours. Cardiac Enzymes: No results for input(s): CKTOTAL, CKMB, CKMBINDEX, TROPONINI in the last 168 hours. BNP (last 3 results) No results for input(s): PROBNP in the last 8760 hours. HbA1C: No results for input(s): HGBA1C in the last 72 hours.  CBG: Recent Labs  Lab 03/10/21 0804 03/10/21 1132 03/10/21 1551 03/10/21 2129 03/11/21 0815  GLUCAP 90 121* 104* 106* 81   Lipid Profile: No results for input(s): CHOL, HDL, LDLCALC, TRIG, CHOLHDL, LDLDIRECT in the last 72 hours. Thyroid Function Tests: No results for input(s): TSH, T4TOTAL, FREET4, T3FREE, THYROIDAB in the last 72 hours.  Anemia Panel: No results for input(s): VITAMINB12, FOLATE, FERRITIN, TIBC, IRON, RETICCTPCT in the last 72 hours.  Sepsis Labs: Recent Labs  Lab 03/07/21 1135  03/07/21 1320 03/07/21 1419  PROCALCITON  --  0.56  --   LATICACIDVEN 1.8  --  1.0    Recent Results (from the past 240 hour(s))  Resp Panel by RT-PCR (Flu A&B, Covid) Nasopharyngeal Swab     Status: None   Collection Time: 03/07/21 12:55 PM   Specimen: Nasopharyngeal Swab; Nasopharyngeal(NP) swabs in vial transport medium  Result Value Ref Range Status   SARS Coronavirus 2 by RT PCR NEGATIVE NEGATIVE Final    Comment: (NOTE) SARS-CoV-2 target nucleic acids are NOT DETECTED.  The SARS-CoV-2 RNA is generally detectable in upper respiratory specimens during the acute phase of infection. The  lowest concentration of SARS-CoV-2 viral copies this assay can detect is 138 copies/mL. A negative result does not preclude SARS-Cov-2 infection and should not be used as the sole basis for treatment or other patient management decisions. A negative result may occur with  improper specimen collection/handling, submission of specimen other than nasopharyngeal swab, presence of viral mutation(s) within the areas targeted by this assay, and inadequate number of viral copies(<138 copies/mL). A negative result must be combined with clinical observations, patient history, and epidemiological information. The expected result is Negative.  Fact Sheet for Patients:  EntrepreneurPulse.com.au  Fact Sheet for Healthcare Providers:  IncredibleEmployment.be  This test is no t yet approved or cleared by the [place] FDA and  has been authorized for detection and/or diagnosis of SARS-CoV-2 by FDA under an Emergency Use Authorization (EUA). This EUA will remain  in effect (meaning this test can be used) for the duration of the COVID-19 declaration under Section 564(b)(1) of the Act, 21 U.S.C.section 360bbb-3(b)(1), unless the authorization is terminated  or revoked sooner.       Influenza A by PCR NEGATIVE NEGATIVE Final   Influenza B by PCR NEGATIVE NEGATIVE Final    Comment: (NOTE) The Xpert Xpress SARS-CoV-2/FLU/RSV plus assay is intended as an aid in the diagnosis of influenza from Nasopharyngeal swab specimens and should not be used as a sole basis for treatment. Nasal washings and aspirates are unacceptable for Xpert Xpress SARS-CoV-2/FLU/RSV testing.  Fact Sheet for Patients: EntrepreneurPulse.com.au  Fact Sheet for Healthcare Providers: IncredibleEmployment.be  This test is not yet approved or cleared by the [place] FDA and has been authorized for detection and/or diagnosis of SARS-CoV-2 by FDA under  an Emergency Use Authorization (EUA). This EUA will remain in effect (meaning this test can be used) for the duration of the COVID-19 declaration under Section 564(b)(1) of the Act, 21 U.S.C. section 360bbb-3(b)(1), unless the authorization is terminated or revoked.  Performed at [facility], [address] [place], [place] [address]          Radiology Studies: No results found.      Scheduled Meds:  carvedilol  12.5 mg Oral BID WC   Chlorhexidine Gluconate Cloth  6 each Topical Daily   heparin  5,000 Units Subcutaneous Q8H   insulin aspart  0-5 Units Subcutaneous QHS   insulin aspart  0-6 Units Subcutaneous TID WC   lidocaine  1 patch Transdermal Q24H   traZODone  50 mg Oral QPM   venlafaxine  37.5 mg Oral BID WC   Continuous Infusions:    Assessment & Plan:   Active Problems:   Hypertension   Type 2 diabetes mellitus (HCC)   Chronic kidney disease (CKD), stage IV (severe) (HCC)   Pulmonary hypertension, unspecified ([place])   ESRD on dialysis ([place])   Frequent falls   Homelessness  Financial difficulty   # Increased fall and weakness- Likely secondary to postdialysis treatment and hypotension B12, B1, vitamin D and procalcitonin was ordered for work-up Echo ordered.  Previous echo on 12/21 revealed normal EF Fall precautions 10/29 PT recommended SNF Case management working on placement Also needs to change HD [facility] patient       # End-stage renal disease on hemodialysis - Patient completed hemodialysis session on 03/07/2021 10/29 nephrology following  Continue with dialysis  Working on outpatient placement for dialysis in [place]      # Hypertension- Continue carvedilol.  May need to hold a.m. dose prior to dialysis on dialysis days due to avoiding hypotension     # Depression/anxiety-continue venlafaxine        # Insomnia-trazodone 50 mg nightly    # Headaches-topiramate 50 mg p.o. twice daily as needed for  headaches   # History of diabetes type 2-presumed improved with end-stage renal disease BG stable Continue RISS    DVT prophylaxis: Heparin Code Status: Full Family Communication: None at bedside Disposition Plan:  Status is: Inpatient  Remains inpatient appropriate because: Unsafe discharge.  Needs SNF placement.  Case management working on this        LOS: 3 days   Time spent: 35 minutes with more than 50% on [place], MD [facility] Pager [phone]  If 7PM-7AM, please contact night-coverage 03/11/2021, 8:38 AM

## 2021-03-11 NOTE — Plan of Care (Signed)
Continuing with plan of care. 

## 2021-03-12 DIAGNOSIS — N184 Chronic kidney disease, stage 4 (severe): Secondary | ICD-10-CM | POA: Diagnosis not present

## 2021-03-12 DIAGNOSIS — Z599 Problem related to housing and economic circumstances, unspecified: Secondary | ICD-10-CM | POA: Diagnosis not present

## 2021-03-12 DIAGNOSIS — R296 Repeated falls: Secondary | ICD-10-CM | POA: Diagnosis not present

## 2021-03-12 DIAGNOSIS — N186 End stage renal disease: Secondary | ICD-10-CM | POA: Diagnosis not present

## 2021-03-12 DIAGNOSIS — K5909 Other constipation: Secondary | ICD-10-CM

## 2021-03-12 LAB — GLUCOSE, CAPILLARY
Glucose-Capillary: 86 mg/dL (ref 70–99)
Glucose-Capillary: 99 mg/dL (ref 70–99)
Glucose-Capillary: 99 mg/dL (ref 70–99)
Glucose-Capillary: 120 mg/dL — ABNORMAL HIGH (ref 70–99)

## 2021-03-12 MED ORDER — POLYETHYLENE GLYCOL 3350 17 G PO PACK
17.0000 g | PACK | Freq: Every day | ORAL | Status: DC
  Administered 2021-03-13 – 2021-03-20 (×6): 17 g via ORAL
  Filled 2021-03-12 (×7): qty 1

## 2021-03-12 MED ORDER — GUAIFENESIN 100 MG/5ML PO LIQD
5.0000 mL | ORAL | Status: DC | PRN
  Administered 2021-03-13 – 2021-03-19 (×4): 5 mL via ORAL
  Filled 2021-03-12 (×6): qty 5

## 2021-03-12 MED ORDER — SENNOSIDES-DOCUSATE SODIUM 8.6-50 MG PO TABS
1.0000 | ORAL_TABLET | Freq: Every day | ORAL | Status: DC
  Administered 2021-03-12 – 2021-03-17 (×6): 1 via ORAL
  Filled 2021-03-12 (×6): qty 1

## 2021-03-12 NOTE — Plan of Care (Signed)
Continuing with plan of care. 

## 2021-03-12 NOTE — Progress Notes (Signed)
PROGRESS NOTE    Katie [name]  NWG:[id number] DOB: Sep 22, 1955 DOA: 03/07/2021 PCP: Patient, [name] (Inactive)    Brief Narrative:  Katie [name] is a 65 y.o. female with medical history significant for hypertension, end-stage renal disease on hemodialysis [facility], heart failure preserved ejection fraction, chronic low back pain, presents emergency department for chief concerns of frequent falls and syncope after dialysis.  10/26 bp better. PT rec. Snf 10/27 in dialysis this AM no chest pain, dizziness or hypotension.  Held her carvedilol a.m. dose prior to dialysis. 10/29 some dizziness no other complaints 10/30 last BM Tuesday. No other complaints  Consultants:  nephrology  Procedures:   Antimicrobials:    Subjective:  No abd pain/n/v   Objective: Vitals:   03/11/21 1701 03/11/21 2018 03/12/21 0335 03/12/21 0808  BP: (!) 142/60 126/62 (!) 142/69 124/63  Pulse: 84 84 74 75  Resp: 12 16 18 14   Temp: 98.9 F (37.2 C) 98.9 F (37.2 C) 98.9 F (37.2 C) 98.9 F (37.2 C)  TempSrc: Oral Oral Oral Oral  SpO2: 96% 94% 94% 95%  Weight:      Height:        Intake/Output Summary (Last 24 hours) at 03/12/2021 0843 Last data filed at 03/12/2021 0540 Gross per 24 hour  Intake --  Output 292 ml  Net -292 ml   Filed Weights   03/09/21 1218 03/11/21 1302 03/11/21 1625  Weight: 82.7 kg 83.9 kg 84 kg    Examination: Nad, calm Cta no w/r Regular s1/s2 no gallop Soft benign +BS No edema aaxoxo3  Data Reviewed: I have personally reviewed following labs and imaging studies  CBC: Recent Labs  Lab 03/06/21 0940 03/07/21 1114 03/08/21 0633  WBC 7.9 8.7 6.2  HGB 11.7* 12.2 10.4*  HCT 35.7* 36.2 31.7*  MCV 99.2 97.6 98.4  PLT 332 316 578   Basic Metabolic Panel: Recent Labs  Lab 03/06/21 0940 03/07/21 1114 03/08/21 0633  NA 137 135 134*  K 4.2 3.3* 4.3  CL 102 97* 99  CO2 20* 26 25  GLUCOSE 104* 107* 94  BUN 48* 15 40*  CREATININE 5.53* 2.68* 4.91*   CALCIUM 8.8* 8.7* 8.2*   GFR: Estimated Creatinine Clearance: 13 mL/min (A) (by C-G formula based on SCr of 4.91 mg/dL (H)). Liver Function Tests: Recent Labs  Lab 03/07/21 1114  AST 22  ALT 20  ALKPHOS 94  BILITOT 0.9  PROT 7.5  ALBUMIN 3.6   No results for input(s): LIPASE, AMYLASE in the last 168 hours. No results for input(s): AMMONIA in the last 168 hours. Coagulation Profile: No results for input(s): INR, PROTIME in the last 168 hours. Cardiac Enzymes: No results for input(s): CKTOTAL, CKMB, CKMBINDEX, TROPONINI in the last 168 hours. BNP (last 3 results) No results for input(s): PROBNP in the last 8760 hours. HbA1C: No results for input(s): HGBA1C in the last 72 hours.  CBG: Recent Labs  Lab 03/11/21 0815 03/11/21 1158 03/11/21 1702 03/11/21 2029 03/12/21 0810  GLUCAP 81 82 82 123* 86   Lipid Profile: No results for input(s): CHOL, HDL, LDLCALC, TRIG, CHOLHDL, LDLDIRECT in the last 72 hours. Thyroid Function Tests: No results for input(s): TSH, T4TOTAL, FREET4, T3FREE, THYROIDAB in the last 72 hours.  Anemia Panel: No results for input(s): VITAMINB12, FOLATE, FERRITIN, TIBC, IRON, RETICCTPCT in the last 72 hours.  Sepsis Labs: Recent Labs  Lab 03/07/21 1135 03/07/21 1320 03/07/21 1419  PROCALCITON  --  0.56  --   LATICACIDVEN 1.8  --  1.0    Recent Results (from the past 240 hour(s))  Resp Panel by RT-PCR (Flu A&B, Covid) Nasopharyngeal Swab     Status: None   Collection Time: 03/07/21 12:55 PM   Specimen: Nasopharyngeal Swab; Nasopharyngeal(NP) swabs in vial transport medium  Result Value Ref Range Status   SARS Coronavirus 2 by RT PCR NEGATIVE NEGATIVE Final    Comment: (NOTE) SARS-CoV-2 target nucleic acids are NOT DETECTED.  The SARS-CoV-2 RNA is generally detectable in upper respiratory specimens during the acute phase of infection. The lowest concentration of SARS-CoV-2 viral copies this assay can detect is 138 copies/mL. A negative  result does not preclude SARS-Cov-2 infection and should not be used as the sole basis for treatment or other patient management decisions. A negative result may occur with  improper specimen collection/handling, submission of specimen other than nasopharyngeal swab, presence of viral mutation(s) within the areas targeted by this assay, and inadequate number of viral copies(<138 copies/mL). A negative result must be combined with clinical observations, patient history, and epidemiological information. The expected result is Negative.  Fact Sheet for Patients:  EntrepreneurPulse.com.au  Fact Sheet for Healthcare Providers:  IncredibleEmployment.be  This test is no t yet approved or cleared by the [place] FDA and  has been authorized for detection and/or diagnosis of SARS-CoV-2 by FDA under an Emergency Use Authorization (EUA). This EUA will remain  in effect (meaning this test can be used) for the duration of the COVID-19 declaration under Section 564(b)(1) of the Act, 21 U.S.C.section 360bbb-3(b)(1), unless the authorization is terminated  or revoked sooner.       Influenza A by PCR NEGATIVE NEGATIVE Final   Influenza B by PCR NEGATIVE NEGATIVE Final    Comment: (NOTE) The Xpert Xpress SARS-CoV-2/FLU/RSV plus assay is intended as an aid in the diagnosis of influenza from Nasopharyngeal swab specimens and should not be used as a sole basis for treatment. Nasal washings and aspirates are unacceptable for Xpert Xpress SARS-CoV-2/FLU/RSV testing.  Fact Sheet for Patients: EntrepreneurPulse.com.au  Fact Sheet for Healthcare Providers: IncredibleEmployment.be  This test is not yet approved or cleared by the [place] FDA and has been authorized for detection and/or diagnosis of SARS-CoV-2 by FDA under an Emergency Use Authorization (EUA). This EUA will remain in effect (meaning this test can be used)  for the duration of the COVID-19 declaration under Section 564(b)(1) of the Act, 21 U.S.C. section 360bbb-3(b)(1), unless the authorization is terminated or revoked.  Performed at [facility], [address] [place], [place] [address]          Radiology Studies: No results found.      Scheduled Meds:  carvedilol  12.5 mg Oral BID WC   Chlorhexidine Gluconate Cloth  6 each Topical Daily   heparin  5,000 Units Subcutaneous Q8H   insulin aspart  0-5 Units Subcutaneous QHS   insulin aspart  0-6 Units Subcutaneous TID WC   lidocaine  1 patch Transdermal Q24H   traZODone  50 mg Oral QPM   venlafaxine  37.5 mg Oral BID WC   Continuous Infusions:    Assessment & Plan:   Active Problems:   Hypertension   Type 2 diabetes mellitus (HCC)   Chronic kidney disease (CKD), stage IV (severe) ([place])   Pulmonary hypertension, unspecified ([place])   ESRD on dialysis ([place])   Frequent falls   Homelessness   Financial difficulty   # Increased fall and weakness- Likely secondary to postdialysis treatment and hypotension B12, B1, vitamin  D and procalcitonin was ordered for work-up Echo ordered.  Previous echo on 12/21 revealed normal EF Fall precautions 10/29 PT recommended SNF 10/30 SNF pending Also needs to change HD to [facility]        # End-stage renal disease on hemodialysis - Patient completed hemodialysis session on 03/07/2021 10/30 nephrology following Continue with dialysis Working on outpatient dialysis placement at [facility]   #Constipation Started on [place] and Senokot If fails will give enema   # Hypertension- Continue carvedilol.  May need to hold a.m. dose prior to dialysis on dialysis days due to avoiding hypotension     # Depression/anxiety-continue venlafaxine        # Insomnia-trazodone 50 mg nightly    # Headaches-topiramate 50 mg p.o. twice daily as needed for headaches   # History of diabetes type  2-presumed improved with end-stage renal disease BG stable Continue RISS    DVT prophylaxis: Heparin Code Status: Full Family Communication: None at bedside Disposition Plan:  Status is: Inpatient  Remains inpatient appropriate because: Unsafe discharge.  Needs SNF placement.  Case management working on this        LOS: 4 days   Time spent: 35 minutes with more than 50% on [place], MD [facility] Pager [phone]  If 7PM-7AM, please contact night-coverage 03/12/2021, 8:43 AM

## 2021-03-12 NOTE — Progress Notes (Signed)
[facility]  ROUNDING NOTE   Subjective:   Katie [name] is a 65 year old female with past medical history including heart failure with preserved EF, chronic back pain chronic hypertension, and end-stage renal disease on hemodialysis. She has been admitted for ESRD (end stage renal disease) ([place]) [N18.6] Frequent falls [R29.6] Recurrent falls [R29.6] Syncope, unspecified syncope type [R55]   This morning she states that she feels constipated. Able to eat a good breakfast Denies any shortness of breath or leg edema Has a slight cough.  Requesting Robitussin   Objective:  Vital signs in last 24 hours:  Temp:  [98.9 F (37.2 C)-99.9 F (37.7 C)] 98.9 F (37.2 C) (10/30 0808) Pulse Rate:  [69-84] 75 (10/30 0808) Resp:  [10-19] 14 (10/30 0808) BP: (86-149)/(55-70) 124/63 (10/30 0808) SpO2:  [94 %-97 %] 95 % (10/30 0808) Weight:  [83.9 kg-84 kg] 84 kg (10/29 1625)  Weight change:  Filed Weights   03/09/21 1218 03/11/21 1302 03/11/21 1625  Weight: 82.7 kg 83.9 kg 84 kg    Intake/Output: I/O last 3 completed shifts: In: -  Out: 592 [Urine:700]   Intake/Output this shift:  No intake/output data recorded.  Physical Exam: General: Resting in bed, in no acute distress  Head:  Moist oral mucosal membranes  Lungs:  Respirations even,unlabored   Heart: S1S2, no rubs or gallops  Abdomen:  Soft, nontender,non distended  Extremities:  trace peripheral edema.  Neurologic: Awake,alert,oriented  Skin: No acute  lesions or rashes  Access: Rt chest Permcath    Basic Metabolic Panel: Recent Labs  Lab 03/06/21 0940 03/07/21 1114 03/08/21 0633  NA 137 135 134*  K 4.2 3.3* 4.3  CL 102 97* 99  CO2 20* 26 25  GLUCOSE 104* 107* 94  BUN 48* 15 40*  CREATININE 5.53* 2.68* 4.91*  CALCIUM 8.8* 8.7* 8.2*     Liver Function Tests: Recent Labs  Lab 03/07/21 1114  AST 22  ALT 20  ALKPHOS 94  BILITOT 0.9  PROT 7.5  ALBUMIN 3.6    No results for input(s):  LIPASE, AMYLASE in the last 168 hours. No results for input(s): AMMONIA in the last 168 hours.  CBC: Recent Labs  Lab 03/06/21 0940 03/07/21 1114 03/08/21 0633  WBC 7.9 8.7 6.2  HGB 11.7* 12.2 10.4*  HCT 35.7* 36.2 31.7*  MCV 99.2 97.6 98.4  PLT 332 316 277     Cardiac Enzymes: No results for input(s): CKTOTAL, CKMB, CKMBINDEX, TROPONINI in the last 168 hours.  BNP: Invalid input(s): POCBNP  CBG: Recent Labs  Lab 03/11/21 0815 03/11/21 1158 03/11/21 1702 03/11/21 2029 03/12/21 0810  GLUCAP 81 82 82 123* 86     Microbiology: Results for orders placed or performed during the hospital encounter of 03/07/21  Resp Panel by RT-PCR (Flu A&B, Covid) Nasopharyngeal Swab     Status: None   Collection Time: 03/07/21 12:55 PM   Specimen: Nasopharyngeal Swab; Nasopharyngeal(NP) swabs in vial transport medium  Result Value Ref Range Status   SARS Coronavirus 2 by RT PCR NEGATIVE NEGATIVE Final    Comment: (NOTE) SARS-CoV-2 target nucleic acids are NOT DETECTED.  The SARS-CoV-2 RNA is generally detectable in upper respiratory specimens during the acute phase of infection. The lowest concentration of SARS-CoV-2 viral copies this assay can detect is 138 copies/mL. A negative result does not preclude SARS-Cov-2 infection and should not be used as the sole basis for treatment or other patient management decisions. A negative result may occur with  improper specimen  collection/handling, submission of specimen other than nasopharyngeal swab, presence of viral mutation(s) within the areas targeted by this assay, and inadequate number of viral copies(<138 copies/mL). A negative result must be combined with clinical observations, patient history, and epidemiological information. The expected result is Negative.  Fact Sheet for Patients:  EntrepreneurPulse.com.au  Fact Sheet for Healthcare Providers:  IncredibleEmployment.be  This test is no t  yet approved or cleared by the [place] FDA and  has been authorized for detection and/or diagnosis of SARS-CoV-2 by FDA under an Emergency Use Authorization (EUA). This EUA will remain  in effect (meaning this test can be used) for the duration of the COVID-19 declaration under Section 564(b)(1) of the Act, 21 U.S.C.section 360bbb-3(b)(1), unless the authorization is terminated  or revoked sooner.       Influenza A by PCR NEGATIVE NEGATIVE Final   Influenza B by PCR NEGATIVE NEGATIVE Final    Comment: (NOTE) The Xpert Xpress SARS-CoV-2/FLU/RSV plus assay is intended as an aid in the diagnosis of influenza from Nasopharyngeal swab specimens and should not be used as a sole basis for treatment. Nasal washings and aspirates are unacceptable for Xpert Xpress SARS-CoV-2/FLU/RSV testing.  Fact Sheet for Patients: EntrepreneurPulse.com.au  Fact Sheet for Healthcare Providers: IncredibleEmployment.be  This test is not yet approved or cleared by the [place] FDA and has been authorized for detection and/or diagnosis of SARS-CoV-2 by FDA under an Emergency Use Authorization (EUA). This EUA will remain in effect (meaning this test can be used) for the duration of the COVID-19 declaration under Section 564(b)(1) of the Act, 21 U.S.C. section 360bbb-3(b)(1), unless the authorization is terminated or revoked.  Performed at [facility], [address] [place], [place] [address]     Coagulation Studies: No results for input(s): LABPROT, INR in the last 72 hours.  Urinalysis: No results for input(s): COLORURINE, LABSPEC, PHURINE, GLUCOSEU, HGBUR, BILIRUBINUR, KETONESUR, PROTEINUR, UROBILINOGEN, NITRITE, LEUKOCYTESUR in the last 72 hours.  Invalid input(s): APPERANCEUR    Imaging: No results found.   Medications:     carvedilol  12.5 mg Oral BID WC   Chlorhexidine Gluconate Cloth  6 each Topical Daily   heparin  5,000  Units Subcutaneous Q8H   insulin aspart  0-5 Units Subcutaneous QHS   insulin aspart  0-6 Units Subcutaneous TID WC   lidocaine  1 patch Transdermal Q24H   polyethylene glycol  17 g Oral Daily   traZODone  50 mg Oral QPM   venlafaxine  37.5 mg Oral BID WC   guaiFENesin, ondansetron **OR** ondansetron (ZOFRAN) IV, oxyCODONE, topiramate  Assessment/ Plan:  [name] is a 65 y.o.  female with past medical history including heart failure with preserved EF, chronic back pain chronic hypertension, and end-stage renal disease on hemodialysis. She has been admitted for ESRD (end stage renal disease) ([place]) [N18.6] Frequent falls [R29.6] Recurrent falls [R29.6] Syncope, unspecified syncope type [R55]  [facility]/TTS/Rt Permcath/84 kg  #End-stage renal disease on hemodialysis.   Next HD planned for Tuesday Patient still has Rt. Chest Permcath as access for HD Discussed about fistula placement   #Anemia of chronic kidney disease Lab Results  Component Value Date   HGB 10.4 (L) 03/08/2021  Will continue monitoring CBCs  #Secondary Hyperparathyroidism: Lab Results  Component Value Date   PTH 70 (H) 05/12/2020   CALCIUM 8.2 (L) 03/08/2021   PHOS 2.7 06/27/2020  Will continue monitoring bone mineral metabolism parameters  #Chronic diastolic heart failure: ECHO from 03/07/21 shows EF  of 60-65%. No acute respiratory distress noted  #Diabetes with CKD Lab Results  Component Value Date   HGBA1C 5.1 03/07/2021  Continue management per primary team   LOS: [place] 10/30/202211:25 AM

## 2021-03-13 ENCOUNTER — Inpatient Hospital Stay: Payer: Medicaid Other

## 2021-03-13 ENCOUNTER — Ambulatory Visit: Payer: Medicaid Other | Admitting: Student in an Organized Health Care Education/Training Program

## 2021-03-13 DIAGNOSIS — I10 Essential (primary) hypertension: Secondary | ICD-10-CM | POA: Diagnosis not present

## 2021-03-13 DIAGNOSIS — R296 Repeated falls: Secondary | ICD-10-CM | POA: Diagnosis not present

## 2021-03-13 DIAGNOSIS — N184 Chronic kidney disease, stage 4 (severe): Secondary | ICD-10-CM | POA: Diagnosis not present

## 2021-03-13 DIAGNOSIS — N186 End stage renal disease: Secondary | ICD-10-CM | POA: Diagnosis not present

## 2021-03-13 LAB — GLUCOSE, CAPILLARY
Glucose-Capillary: 89 mg/dL (ref 70–99)
Glucose-Capillary: 105 mg/dL — ABNORMAL HIGH (ref 70–99)
Glucose-Capillary: 107 mg/dL — ABNORMAL HIGH (ref 70–99)
Glucose-Capillary: 90 mg/dL (ref 70–99)

## 2021-03-13 LAB — MRSA NEXT GEN BY PCR, NASAL: MRSA by PCR Next Gen: NOT DETECTED

## 2021-03-13 MED ORDER — ONDANSETRON HCL 4 MG/2ML IJ SOLN
4.0000 mg | Freq: Three times a day (TID) | INTRAMUSCULAR | Status: DC | PRN
  Administered 2021-03-13 – 2021-03-21 (×7): 4 mg via INTRAVENOUS
  Filled 2021-03-13 (×7): qty 2

## 2021-03-13 NOTE — Consult Note (Signed)
[facility] Vascular Consult Note  MRN : [id number]  Katie [name] is a 65 y.o. ([birth year]) female who presents with chief complaint of progressively worsening weakness.  History of Present Illness:  Katie [name] is a 65 year old female with medical history significant for hypertension, end-stage renal disease on hemodialysis (Tu, Th, Sa), heart failure preserved ejection fraction, chronic low back pain, presents emergency department for chief concerns of frequent falls and syncope after dialysis.   She reports that she has had episodes of syncope after hemodialysis. She started HD in December 2021.  She reports that every time she has dialysis, she becomes very short of breath and weak and has increased chest pain.  She also feels very dizzy.  She thinks that she may have had loss of consciousness however she does not remember.  She denies any recent fever, diarrhea.  She did endorse nausea and one episode of vomiting.  She reports that she has not had a lot to eat recently.   She reports that she does not have children.  She has never been married.  She also reports that she no longer has a place to stay.   Social history: She lives with her friend, who states she can't live there anymore. She is a former tobacco user. At her peak, 1.5 ppd. She denies etoh and recreational drug use. She is disabled and formerly worked as Optician, dispensing.   Vascular surgery was consulted by Dr. Candiss Norse for possible creation of an upper extremity dialysis access fistula versus graft while the patient is inpatient.   Current Facility-Administered Medications  Medication Dose Route Frequency Provider Last Rate Last Admin   carvedilol (COREG) tablet 12.5 mg  12.5 mg Oral BID WC Cox, Amy N, DO   12.5 mg at 03/13/21 0859   Chlorhexidine Gluconate Cloth 2 % PADS 6 each  6 each Topical Daily Nolberto Hanlon, MD   6 each at 03/12/21 0944   guaiFENesin (ROBITUSSIN) 100 MG/5ML liquid 5 mL   5 mL Oral Q4H PRN Murlean Iba, MD   5 mL at 03/13/21 0407   heparin injection 5,000 Units  5,000 Units Subcutaneous Q8H Cox, Amy N, DO   5,000 Units at 03/13/21 0556   insulin aspart (novoLOG) injection 0-5 Units  0-5 Units Subcutaneous QHS Cox, Amy N, DO       insulin aspart (novoLOG) injection 0-6 Units  0-6 Units Subcutaneous TID WC Cox, Amy N, DO   1 Units at 03/07/21 1739   lidocaine (LIDODERM) 5 % 1 patch  1 patch Transdermal Q24H Cox, Amy N, DO   1 patch at 03/13/21 0905   ondansetron (ZOFRAN) tablet 4 mg  4 mg Oral Q6H PRN Cox, Amy N, DO   4 mg at 03/11/21 1736   Or   ondansetron (ZOFRAN) injection 4 mg  4 mg Intravenous Q6H PRN Cox, Amy N, DO   4 mg at 03/12/21 0536   oxyCODONE (Oxy IR/ROXICODONE) immediate release tablet 5 mg  5 mg Oral Q6H PRN Nolberto Hanlon, MD   5 mg at 03/12/21 1745   polyethylene glycol (MIRALAX / GLYCOLAX) packet 17 g  17 g Oral Daily Murlean Iba, MD   17 g at 03/13/21 0859   senna-docusate (Senokot-S) tablet 1 tablet  1 tablet Oral QHS Nolberto Hanlon, MD   1 tablet at 03/12/21 2141   topiramate (TOPAMAX) tablet 50 mg  50 mg Oral BID PRN Cox, Amy N, DO  traZODone (DESYREL) tablet 50 mg  50 mg Oral QPM Cox, Amy N, DO   50 mg at 03/12/21 1745   venlafaxine [facility]) tablet 37.5 mg  37.5 mg Oral BID WC Cox, Amy N, DO   37.5 mg at 03/13/21 1610   Past Medical History:  Diagnosis Date   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[place])    Hypertension    Renal disorder    Type 2 diabetes mellitus ([place])    Past Surgical History:  Procedure Laterality Date   APPENDECTOMY     DIALYSIS/PERMA CATHETER INSERTION N/A 05/16/2020   Procedure: DIALYSIS/PERMA CATHETER INSERTION;  Surgeon: Algernon Huxley, MD;  Location: [facility];  Service: Cardiovascular;  Laterality: N/A;   MANDIBLE SURGERY     RENAL BIOPSY Right    RIGHT HEART CATH N/A 05/10/2020   Procedure: RIGHT HEART CATH;  Surgeon: Nelva Bush, MD;  Location: [facility];  Service:  Cardiovascular;  Laterality: N/A;   TEMPORARY DIALYSIS CATHETER N/A 05/11/2020   Procedure: TEMPORARY DIALYSIS CATHETER;  Surgeon: Algernon Huxley, MD;  Location: [facility];  Service: Cardiovascular;  Laterality: N/A;   Social History Social History   Tobacco Use   Smoking status: Former   Smokeless tobacco: Never  Scientific laboratory technician Use: Never used  Substance Use Topics   Alcohol use: Not Currently   Family History Family History  Problem Relation Age of Onset   Heart attack Mother    Asthma Mother    Uterine cancer Mother    Skin cancer Father    Prostate cancer Father    CAD Father    Parkinson's disease Father    Lung cancer Brother   Denies family history of peripheral artery disease, venous or renal disease.  Allergies  Allergen Reactions   Penicillins Nausea And Vomiting    Pt states "only pills" make her vomit   REVIEW OF SYSTEMS (Negative unless checked)  Constitutional: [] Weight loss  [] Fever  [] Chills Cardiac: [x] Chest pain   [] Chest pressure   [] Palpitations   [] Shortness of breath when laying flat   [] Shortness of breath at rest   [x] Shortness of breath with exertion. Vascular:  [] Pain in legs with walking   [] Pain in legs at rest   [] Pain in legs when laying flat   [] Claudication   [] Pain in feet when walking  [] Pain in feet at rest  [] Pain in feet when laying flat   [] History of DVT   [] Phlebitis   [] Swelling in legs   [] Varicose veins   [] Non-healing ulcers Pulmonary:   [] Uses home oxygen   [] Productive cough   [] Hemoptysis   [] Wheeze  [] COPD   [] Asthma Neurologic:  [] Dizziness  [] Blackouts   [] Seizures   [] History of stroke   [] History of TIA  [] Aphasia   [] Temporary blindness   [] Dysphagia   [] Weakness or numbness in arms   [] Weakness or numbness in legs Musculoskeletal:  [] Arthritis   [] Joint swelling   [] Joint pain   [] Low back pain Hematologic:  [] Easy bruising  [] Easy bleeding   [] Hypercoagulable state   [] Anemic  [] Hepatitis Gastrointestinal:   [] Blood in stool   [] Vomiting blood  [] Gastroesophageal reflux/heartburn   [] Difficulty swallowing. Genitourinary:  [x] Chronic kidney disease   [] Difficult urination  [] Frequent urination  [] Burning with urination   [] Blood in urine Skin:  [] Rashes   [] Ulcers   [] Wounds Psychological:  [] History of anxiety   []  History of major depression.  Physical Examination  Vitals:   03/12/21 1705 03/12/21 1955 03/13/21 0555 03/13/21 0824  BP: (!) 159/74 103/62 (!) 153/73 (!) 144/74  Pulse: 75 73 74 72  Resp: 12 18 16 16   Temp: 98.6 F (37 C) 98.3 F (36.8 C) 99.1 F (37.3 C) 98.7 F (37.1 C)  TempSrc: Oral Oral Oral Oral  SpO2: 97% 95% 93% 95%  Weight:      Height:       Body mass index is 29.89 kg/m. Gen:  WD/WN, NAD Head: [place]/AT, No temporalis wasting. Prominent temp pulse not noted. Ear/Nose/Throat: Hearing grossly intact, nares w/o erythema or drainage, oropharynx w/o Erythema/Exudate Eyes: Sclera non-icteric, conjunctiva clear Neck: Trachea midline.  No JVD.  Pulmonary:  Good air movement, respirations not labored, equal bilaterally.  Cardiac: RRR, normal S1, S2. Vascular:  Vessel Right Left  Radial Palpable Palpable  Ulnar Palpable Palpable   Right IJ PermCath: Intact, clean and dry no signs of infection.  Gastrointestinal: soft, non-tender/non-distended. No guarding/reflex.  Musculoskeletal: M/S 5/5 throughout.  Extremities without ischemic changes.  No deformity or atrophy. No edema. Neurologic: Sensation grossly intact in extremities.  Symmetrical.  Speech is fluent. Motor exam as listed above. Psychiatric: Judgment intact, Mood & affect appropriate for pt's clinical situation. Dermatologic: No rashes or ulcers noted.  No cellulitis or open wounds. Lymph : No Cervical, Axillary, or Inguinal lymphadenopathy.  CBC Lab Results  Component Value Date   WBC 6.2 03/08/2021   HGB 10.4 (L) 03/08/2021   HCT 31.7 (L) 03/08/2021   MCV 98.4 03/08/2021   PLT 277 03/08/2021    BMET    Component Value Date/Time   NA 134 (L) 03/08/2021 0633   K 4.3 03/08/2021 0633   CL 99 03/08/2021 0633   CO2 25 03/08/2021 0633   GLUCOSE 94 03/08/2021 0633   BUN 40 (H) 03/08/2021 0633   CREATININE 4.91 (H) 03/08/2021 0633   CALCIUM 8.2 (L) 03/08/2021 0633   GFRNONAA 9 (L) 03/08/2021 0633   GFRAA  01/19/2021 0833    SPECIMEN HEMOLYZED. HEMOLYSIS MAY AFFECT INTEGRITY OF RESULTS.   Estimated Creatinine Clearance: 13 mL/min (A) (by C-G formula based on SCr of 4.91 mg/dL (H)).  COAG No results found for: INR, PROTIME  Radiology DG Chest 2 View  Result Date: 03/07/2021 CLINICAL DATA:  Chest pain EXAM: CHEST - 2 VIEW COMPARISON:  Chest x-ray 03/06/2021 FINDINGS: Heart is enlarged. Mediastinum appears stable. Calcified plaques in the aortic arch. Pulmonary vasculature is normal. Right-sided central line is stable. Lungs are hyperinflated with chronic prominent interstitial lung markings/scarring mostly in the lower lung zones. Trace bilateral pleural effusions. No pneumothorax. IMPRESSION: 1. No acute process identified. 2. Cardiomegaly. 3. Chronic changes in the lower lung zones. Electronically Signed   By: Ofilia Neas   On: 03/07/2021 12:48   DG Chest 2 View  Result Date: 03/06/2021 CLINICAL DATA:  Cough.  End stage kidney disease.  Pain EXAM: CHEST - 2 VIEW COMPARISON:  02/02/2021 FINDINGS: There is a right chest wall dialysis catheter with tips in the cavoatrial junction. Heart size normal. Aortic atherosclerotic calcifications. Blunting of the costophrenic angles are noted bilaterally. Similar appearance of mid and lower lung zone scarring. No signs of interstitial edema or airspace consolidation. Visualized osseous structures are unremarkable. IMPRESSION: 1. Small pleural effusions 2. Bilateral lower lung zone predominant pulmonary parenchymal scarring. Electronically Signed   By: Kerby Moors M.D.   On: 03/06/2021 12:54   DG Lumbar Spine 2-3 Views  Result Date:  03/06/2021  CLINICAL DATA:  Back pain EXAM: LUMBAR SPINE - 2-3 VIEW COMPARISON:  CT abdomen pelvis, 12/12/2020 FINDINGS: No fracture or dislocation of the lumbar spine. Mild multilevel disc space height loss and osteophytosis. Mild multilevel facet degenerative change. Nonobstructive pattern of overlying bowel gas. IMPRESSION: No fracture or dislocation of the lumbar spine. Mild multilevel disc space height loss and osteophytosis. Mild multilevel facet degenerative change. Electronically Signed   By: Delanna Ahmadi M.D.   On: 03/06/2021 12:53   CT HEAD WO CONTRAST (5MM)  Result Date: 03/07/2021 CLINICAL DATA:  Fall, near syncope EXAM: CT HEAD WITHOUT CONTRAST CT CERVICAL SPINE WITHOUT CONTRAST TECHNIQUE: Multidetector CT imaging of the head and cervical spine was performed following the standard protocol without intravenous contrast. Multiplanar CT image reconstructions of the cervical spine were also generated. COMPARISON:  None. FINDINGS: CT HEAD FINDINGS Brain: No evidence of acute infarction, hemorrhage, hydrocephalus, extra-axial collection or mass lesion/mass effect. Mild periventricular white matter hypodensity. 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 Vascular: No hyperdense vessel or unexpected calcification. Skull: Normal. Negative for fracture or focal lesion. Sinuses/Orbits: No acute finding. Other: None. CT CERVICAL SPINE FINDINGS Alignment: Normal. Skull base and vertebrae: No acute fracture. No primary bone lesion or focal pathologic process. Soft tissues and spinal canal: No prevertebral fluid or swelling. No visible canal hematoma. Disc levels: Focally mild disc space height loss and osteophytosis of C5-C6, with otherwise preserved disc spaces. Upper chest: Negative. Other: None. IMPRESSION: 1. No acute intracranial pathology. Small-vessel white matter disease. 2. No fracture or static subluxation of the cervical spine. Electronically Signed   By: Delanna Ahmadi M.D.    On: 03/07/2021 12:42   CT HEAD WO CONTRAST (5MM)  Result Date: 03/06/2021 CLINICAL DATA:  Severe headache and back spasms. EXAM: CT HEAD WITHOUT CONTRAST TECHNIQUE: Contiguous axial images were obtained from the base of the skull through the vertex without intravenous contrast. COMPARISON:  December 15, 2020 FINDINGS: Brain: No evidence of acute infarction, hemorrhage, hydrocephalus, extra-axial collection or mass lesion/mass effect. Vascular: No hyperdense vessel. Atherosclerotic calcifications of the internal carotid and vertebral arteries at the skull base. Skull: Normal. Negative for fracture or focal lesion. Sinuses/Orbits: Visualized portions of the paranasal sinuses and mastoid air cells are predominantly clear. Orbits are unremarkable. Other: None. IMPRESSION: No acute intracranial abnormality. Electronically Signed   By: Dahlia Bailiff M.D.   On: 03/06/2021 12:29   CT Cervical Spine Wo Contrast  Result Date: 03/07/2021 CLINICAL DATA:  Fall, near syncope EXAM: CT HEAD WITHOUT CONTRAST CT CERVICAL SPINE WITHOUT CONTRAST TECHNIQUE: Multidetector CT imaging of the head and cervical spine was performed following the standard protocol without intravenous contrast. Multiplanar CT image reconstructions of the cervical spine were also generated. COMPARISON:  None. FINDINGS: CT HEAD FINDINGS Brain: No evidence of acute infarction, hemorrhage, hydrocephalus, extra-axial collection or mass lesion/mass effect. Mild periventricular white matter hypodensity. 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 Vascular: No hyperdense vessel or unexpected calcification. Skull: Normal. Negative for fracture or focal lesion. Sinuses/Orbits: No acute finding. Other: None. CT CERVICAL SPINE FINDINGS Alignment: Normal. Skull base and vertebrae: No acute fracture. No primary bone lesion or focal pathologic process. Soft tissues and spinal canal: No prevertebral fluid or swelling. No visible canal  hematoma. Disc levels: Focally mild disc space height loss and osteophytosis of C5-C6, with otherwise preserved disc spaces. Upper chest: Negative. Other: None. IMPRESSION: 1. No acute intracranial pathology. Small-vessel white matter disease. 2. No fracture or  static subluxation of the cervical spine. Electronically Signed   By: Delanna Ahmadi M.D.   On: 03/07/2021 12:42   ECHOCARDIOGRAM COMPLETE  Result Date: 03/08/2021    ECHOCARDIOGRAM REPORT   Patient Name:   Katie [name] Date of Exam: 03/07/2021 Medical Rec #:  [id number]    Height:       66.0 in Accession #:    [id number]   Weight:       173.0 lb Date of Birth:  06-03-1955   BSA:          1.880 m Patient Age:    35 years     BP:           90/53 mmHg Patient Gender: F            HR:           85 bpm. Exam Location:  [facility] Procedure: 2D Echo, Cardiac Doppler and Color Doppler Indications:     R55 Syncope  History:         Patient has prior history of Echocardiogram examinations, most                  recent 05/01/2020. Risk Factors:Hypertension and Diabetes.                  Chronic kidney disease.  Sonographer:     Cresenciano Lick RDCS Referring Phys:  [id number] AMY N COX Diagnosing Phys: Isaias Cowman MD IMPRESSIONS  1. Left ventricular ejection fraction, by estimation, is 60 to 65%. The left ventricle has normal function. The left ventricle has no regional wall motion abnormalities. There is moderate left ventricular hypertrophy. Left ventricular diastolic parameters are consistent with Grade I diastolic dysfunction (impaired relaxation).  2. Right ventricular systolic function is normal. The right ventricular size is normal.  3. The mitral valve is normal in structure. Mild mitral valve regurgitation. No evidence of mitral stenosis.  4. The aortic valve is normal in structure. Aortic valve regurgitation is not visualized. Mild aortic valve stenosis.  5. The inferior vena cava is normal in size with greater than 50% respiratory variability,  suggesting right atrial pressure of 3 mmHg. FINDINGS  Left Ventricle: Left ventricular ejection fraction, by estimation, is 60 to 65%. The left ventricle has normal function. The left ventricle has no regional wall motion abnormalities. The left ventricular internal cavity size was normal in size. There is  moderate left ventricular hypertrophy. Left ventricular diastolic parameters are consistent with Grade I diastolic dysfunction (impaired relaxation). Right Ventricle: The right ventricular size is normal. No increase in right ventricular wall thickness. Right ventricular systolic function is normal. Left Atrium: Left atrial size was normal in size. Right Atrium: Right atrial size was normal in size. Pericardium: There is no evidence of pericardial effusion. Mitral Valve: The mitral valve is normal in structure. Mild mitral valve regurgitation. No evidence of mitral valve stenosis. Tricuspid Valve: The tricuspid valve is normal in structure. Tricuspid valve regurgitation is mild . No evidence of tricuspid stenosis. Aortic Valve: The aortic valve is normal in structure. Aortic valve regurgitation is not visualized. Mild aortic stenosis is present. Aortic valve mean gradient measures 9.5 mmHg. Aortic valve peak gradient measures 14.9 mmHg. Aortic valve area, by VTI measures 1.46 cm. Pulmonic Valve: The pulmonic valve was normal in structure. Pulmonic valve regurgitation is not visualized. No evidence of pulmonic stenosis. Aorta: The aortic root is normal in size and structure. Venous: The inferior vena cava is normal in size with greater  than 50% respiratory variability, suggesting right atrial pressure of 3 mmHg. IAS/Shunts: No atrial level shunt detected by color flow Doppler.  LEFT VENTRICLE PLAX 2D LVIDd:         2.70 cm   Diastology LVIDs:         1.90 cm   LV e' medial:    5.84 cm/s LV PW:         1.60 cm   LV E/e' medial:  13.7 LV IVS:        1.60 cm   LV e' lateral:   4.30 cm/s LVOT diam:     1.80 cm   LV  E/e' lateral: 18.6 LV SV:         54 LV SV Index:   29 LVOT Area:     2.54 cm  RIGHT VENTRICLE RV Basal diam:  4.00 cm RV S prime:     9.57 cm/s TAPSE (M-mode): 2.8 cm LEFT ATRIUM             Index        RIGHT ATRIUM           Index LA diam:        4.00 cm 2.13 cm/m   RA Area:     14.10 cm LA Vol (A2C):   39.7 ml 21.11 ml/m  RA Volume:   37.50 ml  19.94 ml/m LA Vol (A4C):   42.1 ml 22.39 ml/m LA Biplane Vol: 42.6 ml 22.66 ml/m  AORTIC VALVE AV Area (Vmax):    1.35 cm AV Area (Vmean):   1.43 cm AV Area (VTI):     1.46 cm AV Vmax:           192.75 cm/s AV Vmean:          146.000 cm/s AV VTI:            0.372 m AV Peak Grad:      14.9 mmHg AV Mean Grad:      9.5 mmHg LVOT Vmax:         102.50 cm/s LVOT Vmean:        82.100 cm/s LVOT VTI:          0.213 m LVOT/AV VTI ratio: 0.57  AORTA Ao Root diam: 3.70 cm MITRAL VALVE MV Area (PHT): 2.80 cm     SHUNTS MV Decel Time: 271 msec     Systemic VTI:  0.21 m MV E velocity: 79.90 cm/s   Systemic Diam: 1.80 cm MV A velocity: 115.00 cm/s MV E/A ratio:  0.69 Isaias Cowman MD Electronically signed by Isaias Cowman MD Signature Date/Time: 03/08/2021/1:45:22 PM    Final     Assessment/Plan Katie [name] is a 65 year old female with medical history significant for hypertension, end-stage renal disease on hemodialysis (Tu, Th, Sa), heart failure preserved ejection fraction, chronic low back pain, presents emergency department for chief concerns of frequent falls and syncope after dialysis.  1.  End-Stage Renal Disease: Patient with known history of end-stage renal disease currently maintained via a right IJ PermCath on hemodialysis.  Patient has been dialyzing for approximately 1 year and has not undergone any formal work-up for creation of either fistula or graft.  We were consulted by the nephrology service and attempt to create an access before her discharge.  I have ordered bilateral upper extremity vein mapping to size her veins if adequate we will  move forward with AV fistula if her veins are too small we would have to  use a graft.  The patient expresses her understanding.  Once I have the results of the vein mapping I can go into more detail of which procedure we would move forward with.  Tentatively planning on surgery Thursday with Dr. Lucky Cowboy  2.  Type 2 Diabetes: Diet controlled? Patient on sliding scale insulin coverage while inpatient. Encouraged good control as its slows the progression of atherosclerotic disease   3.  Hypertension: Prescribed the appropriate medications however unsure how compliant the patient is. Encouraged good control as its slows the progression of atherosclerotic disease.   Sela Hua, PA-C 03/13/2021 12:18 PM  This note was created with Dragon medical transcription system.  Any error is purely unintentional.

## 2021-03-13 NOTE — H&P (View-Only) (Signed)
[facility] Vascular Consult Note  MRN : [id number]  Katie [name] is a 65 y.o. (05-Sep-1955) female who presents with chief complaint of progressively worsening weakness.  History of Present Illness:  Katie [name] is a 65 year old female with medical history significant for hypertension, end-stage renal disease on hemodialysis (Tu, Th, Sa), heart failure preserved ejection fraction, chronic low back pain, presents emergency department for chief concerns of frequent falls and syncope after dialysis.   She reports that she has had episodes of syncope after hemodialysis. She started HD in December 2021.  She reports that every time she has dialysis, she becomes very short of breath and weak and has increased chest pain.  She also feels very dizzy.  She thinks that she may have had loss of consciousness however she does not remember.  She denies any recent fever, diarrhea.  She did endorse nausea and one episode of vomiting.  She reports that she has not had a lot to eat recently.   She reports that she does not have children.  She has never been married.  She also reports that she no longer has a place to stay.   Social history: She lives with her friend, who states she can't live there anymore. She is a former tobacco user. At her peak, 1.5 ppd. She denies etoh and recreational drug use. She is disabled and formerly worked as Optician, dispensing.   Vascular surgery was consulted by Dr. Candiss Norse for possible creation of an upper extremity dialysis access fistula versus graft while the patient is inpatient.   Current Facility-Administered Medications  Medication Dose Route Frequency Provider Last Rate Last Admin   carvedilol (COREG) tablet 12.5 mg  12.5 mg Oral BID WC Cox, Amy N, DO   12.5 mg at 03/13/21 0859   Chlorhexidine Gluconate Cloth 2 % PADS 6 each  6 each Topical Daily Nolberto Hanlon, MD   6 each at 03/12/21 0944   guaiFENesin (ROBITUSSIN) 100 MG/5ML liquid 5 mL   5 mL Oral Q4H PRN Murlean Iba, MD   5 mL at 03/13/21 0407   heparin injection 5,000 Units  5,000 Units Subcutaneous Q8H Cox, Amy N, DO   5,000 Units at 03/13/21 0556   insulin aspart (novoLOG) injection 0-5 Units  0-5 Units Subcutaneous QHS Cox, Amy N, DO       insulin aspart (novoLOG) injection 0-6 Units  0-6 Units Subcutaneous TID WC Cox, Amy N, DO   1 Units at 03/07/21 1739   lidocaine (LIDODERM) 5 % 1 patch  1 patch Transdermal Q24H Cox, Amy N, DO   1 patch at 03/13/21 0905   ondansetron (ZOFRAN) tablet 4 mg  4 mg Oral Q6H PRN Cox, Amy N, DO   4 mg at 03/11/21 1736   Or   ondansetron (ZOFRAN) injection 4 mg  4 mg Intravenous Q6H PRN Cox, Amy N, DO   4 mg at 03/12/21 0536   oxyCODONE (Oxy IR/ROXICODONE) immediate release tablet 5 mg  5 mg Oral Q6H PRN Nolberto Hanlon, MD   5 mg at 03/12/21 1745   polyethylene glycol (MIRALAX / GLYCOLAX) packet 17 g  17 g Oral Daily Murlean Iba, MD   17 g at 03/13/21 0859   senna-docusate (Senokot-S) tablet 1 tablet  1 tablet Oral QHS Nolberto Hanlon, MD   1 tablet at 03/12/21 2141   topiramate (TOPAMAX) tablet 50 mg  50 mg Oral BID PRN Cox, Amy N, DO  traZODone (DESYREL) tablet 50 mg  50 mg Oral QPM Cox, Amy N, DO   50 mg at 03/12/21 1745   venlafaxine [facility]) tablet 37.5 mg  37.5 mg Oral BID WC Cox, Amy N, DO   37.5 mg at 03/13/21 1829   Past Medical History:  Diagnosis Date   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[place])    Hypertension    Renal disorder    Type 2 diabetes mellitus ([place])    Past Surgical History:  Procedure Laterality Date   APPENDECTOMY     DIALYSIS/PERMA CATHETER INSERTION N/A 05/16/2020   Procedure: DIALYSIS/PERMA CATHETER INSERTION;  Surgeon: Algernon Huxley, MD;  Location: [facility];  Service: Cardiovascular;  Laterality: N/A;   MANDIBLE SURGERY     RENAL BIOPSY Right    RIGHT HEART CATH N/A 05/10/2020   Procedure: RIGHT HEART CATH;  Surgeon: Nelva Bush, MD;  Location: [facility];  Service:  Cardiovascular;  Laterality: N/A;   TEMPORARY DIALYSIS CATHETER N/A 05/11/2020   Procedure: TEMPORARY DIALYSIS CATHETER;  Surgeon: Algernon Huxley, MD;  Location: [facility];  Service: Cardiovascular;  Laterality: N/A;   Social History Social History   Tobacco Use   Smoking status: Former   Smokeless tobacco: Never  Scientific laboratory technician Use: Never used  Substance Use Topics   Alcohol use: Not Currently   Family History Family History  Problem Relation Age of Onset   Heart attack Mother    Asthma Mother    Uterine cancer Mother    Skin cancer Father    Prostate cancer Father    CAD Father    Parkinson's disease Father    Lung cancer Brother   Denies family history of peripheral artery disease, venous or renal disease.  Allergies  Allergen Reactions   Penicillins Nausea And Vomiting    Pt states "only pills" make her vomit   REVIEW OF SYSTEMS (Negative unless checked)  Constitutional: [] Weight loss  [] Fever  [] Chills Cardiac: [x] Chest pain   [] Chest pressure   [] Palpitations   [] Shortness of breath when laying flat   [] Shortness of breath at rest   [x] Shortness of breath with exertion. Vascular:  [] Pain in legs with walking   [] Pain in legs at rest   [] Pain in legs when laying flat   [] Claudication   [] Pain in feet when walking  [] Pain in feet at rest  [] Pain in feet when laying flat   [] History of DVT   [] Phlebitis   [] Swelling in legs   [] Varicose veins   [] Non-healing ulcers Pulmonary:   [] Uses home oxygen   [] Productive cough   [] Hemoptysis   [] Wheeze  [] COPD   [] Asthma Neurologic:  [] Dizziness  [] Blackouts   [] Seizures   [] History of stroke   [] History of TIA  [] Aphasia   [] Temporary blindness   [] Dysphagia   [] Weakness or numbness in arms   [] Weakness or numbness in legs Musculoskeletal:  [] Arthritis   [] Joint swelling   [] Joint pain   [] Low back pain Hematologic:  [] Easy bruising  [] Easy bleeding   [] Hypercoagulable state   [] Anemic  [] Hepatitis Gastrointestinal:   [] Blood in stool   [] Vomiting blood  [] Gastroesophageal reflux/heartburn   [] Difficulty swallowing. Genitourinary:  [x] Chronic kidney disease   [] Difficult urination  [] Frequent urination  [] Burning with urination   [] Blood in urine Skin:  [] Rashes   [] Ulcers   [] Wounds Psychological:  [] History of anxiety   []  History of major depression.  Physical Examination  Vitals:   03/12/21 1705 03/12/21 1955 03/13/21 0555 03/13/21 0824  BP: (!) 159/74 103/62 (!) 153/73 (!) 144/74  Pulse: 75 73 74 72  Resp: 12 18 16 16   Temp: 98.6 F (37 C) 98.3 F (36.8 C) 99.1 F (37.3 C) 98.7 F (37.1 C)  TempSrc: Oral Oral Oral Oral  SpO2: 97% 95% 93% 95%  Weight:      Height:       Body mass index is 29.89 kg/m. Gen:  WD/WN, NAD Head: [place]/AT, No temporalis wasting. Prominent temp pulse not noted. Ear/Nose/Throat: Hearing grossly intact, nares w/o erythema or drainage, oropharynx w/o Erythema/Exudate Eyes: Sclera non-icteric, conjunctiva clear Neck: Trachea midline.  No JVD.  Pulmonary:  Good air movement, respirations not labored, equal bilaterally.  Cardiac: RRR, normal S1, S2. Vascular:  Vessel Right Left  Radial Palpable Palpable  Ulnar Palpable Palpable   Right IJ PermCath: Intact, clean and dry no signs of infection.  Gastrointestinal: soft, non-tender/non-distended. No guarding/reflex.  Musculoskeletal: M/S 5/5 throughout.  Extremities without ischemic changes.  No deformity or atrophy. No edema. Neurologic: Sensation grossly intact in extremities.  Symmetrical.  Speech is fluent. Motor exam as listed above. Psychiatric: Judgment intact, Mood & affect appropriate for pt's clinical situation. Dermatologic: No rashes or ulcers noted.  No cellulitis or open wounds. Lymph : No Cervical, Axillary, or Inguinal lymphadenopathy.  CBC Lab Results  Component Value Date   WBC 6.2 03/08/2021   HGB 10.4 (L) 03/08/2021   HCT 31.7 (L) 03/08/2021   MCV 98.4 03/08/2021   PLT 277 03/08/2021    BMET    Component Value Date/Time   NA 134 (L) 03/08/2021 0633   K 4.3 03/08/2021 0633   CL 99 03/08/2021 0633   CO2 25 03/08/2021 0633   GLUCOSE 94 03/08/2021 0633   BUN 40 (H) 03/08/2021 0633   CREATININE 4.91 (H) 03/08/2021 0633   CALCIUM 8.2 (L) 03/08/2021 0633   GFRNONAA 9 (L) 03/08/2021 0633   GFRAA  01/19/2021 0833    SPECIMEN HEMOLYZED. HEMOLYSIS MAY AFFECT INTEGRITY OF RESULTS.   Estimated Creatinine Clearance: 13 mL/min (A) (by C-G formula based on SCr of 4.91 mg/dL (H)).  COAG No results found for: INR, PROTIME  Radiology DG Chest 2 View  Result Date: 03/07/2021 CLINICAL DATA:  Chest pain EXAM: CHEST - 2 VIEW COMPARISON:  Chest x-ray 03/06/2021 FINDINGS: Heart is enlarged. Mediastinum appears stable. Calcified plaques in the aortic arch. Pulmonary vasculature is normal. Right-sided central line is stable. Lungs are hyperinflated with chronic prominent interstitial lung markings/scarring mostly in the lower lung zones. Trace bilateral pleural effusions. No pneumothorax. IMPRESSION: 1. No acute process identified. 2. Cardiomegaly. 3. Chronic changes in the lower lung zones. Electronically Signed   By: Ofilia Neas   On: 03/07/2021 12:48   DG Chest 2 View  Result Date: 03/06/2021 CLINICAL DATA:  Cough.  End stage kidney disease.  Pain EXAM: CHEST - 2 VIEW COMPARISON:  02/02/2021 FINDINGS: There is a right chest wall dialysis catheter with tips in the cavoatrial junction. Heart size normal. Aortic atherosclerotic calcifications. Blunting of the costophrenic angles are noted bilaterally. Similar appearance of mid and lower lung zone scarring. No signs of interstitial edema or airspace consolidation. Visualized osseous structures are unremarkable. IMPRESSION: 1. Small pleural effusions 2. Bilateral lower lung zone predominant pulmonary parenchymal scarring. Electronically Signed   By: Kerby Moors M.D.   On: 03/06/2021 12:54   DG Lumbar Spine 2-3 Views  Result Date:  03/06/2021  CLINICAL DATA:  Back pain EXAM: LUMBAR SPINE - 2-3 VIEW COMPARISON:  CT abdomen pelvis, 12/12/2020 FINDINGS: No fracture or dislocation of the lumbar spine. Mild multilevel disc space height loss and osteophytosis. Mild multilevel facet degenerative change. Nonobstructive pattern of overlying bowel gas. IMPRESSION: No fracture or dislocation of the lumbar spine. Mild multilevel disc space height loss and osteophytosis. Mild multilevel facet degenerative change. Electronically Signed   By: Delanna Ahmadi M.D.   On: 03/06/2021 12:53   CT HEAD WO CONTRAST (5MM)  Result Date: 03/07/2021 CLINICAL DATA:  Fall, near syncope EXAM: CT HEAD WITHOUT CONTRAST CT CERVICAL SPINE WITHOUT CONTRAST TECHNIQUE: Multidetector CT imaging of the head and cervical spine was performed following the standard protocol without intravenous contrast. Multiplanar CT image reconstructions of the cervical spine were also generated. COMPARISON:  None. FINDINGS: CT HEAD FINDINGS Brain: No evidence of acute infarction, hemorrhage, hydrocephalus, extra-axial collection or mass lesion/mass effect. Mild periventricular white matter hypodensity. 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 Vascular: No hyperdense vessel or unexpected calcification. Skull: Normal. Negative for fracture or focal lesion. Sinuses/Orbits: No acute finding. Other: None. CT CERVICAL SPINE FINDINGS Alignment: Normal. Skull base and vertebrae: No acute fracture. No primary bone lesion or focal pathologic process. Soft tissues and spinal canal: No prevertebral fluid or swelling. No visible canal hematoma. Disc levels: Focally mild disc space height loss and osteophytosis of C5-C6, with otherwise preserved disc spaces. Upper chest: Negative. Other: None. IMPRESSION: 1. No acute intracranial pathology. Small-vessel white matter disease. 2. No fracture or static subluxation of the cervical spine. Electronically Signed   By: Delanna Ahmadi M.D.    On: 03/07/2021 12:42   CT HEAD WO CONTRAST (5MM)  Result Date: 03/06/2021 CLINICAL DATA:  Severe headache and back spasms. EXAM: CT HEAD WITHOUT CONTRAST TECHNIQUE: Contiguous axial images were obtained from the base of the skull through the vertex without intravenous contrast. COMPARISON:  December 15, 2020 FINDINGS: Brain: No evidence of acute infarction, hemorrhage, hydrocephalus, extra-axial collection or mass lesion/mass effect. Vascular: No hyperdense vessel. Atherosclerotic calcifications of the internal carotid and vertebral arteries at the skull base. Skull: Normal. Negative for fracture or focal lesion. Sinuses/Orbits: Visualized portions of the paranasal sinuses and mastoid air cells are predominantly clear. Orbits are unremarkable. Other: None. IMPRESSION: No acute intracranial abnormality. Electronically Signed   By: Dahlia Bailiff M.D.   On: 03/06/2021 12:29   CT Cervical Spine Wo Contrast  Result Date: 03/07/2021 CLINICAL DATA:  Fall, near syncope EXAM: CT HEAD WITHOUT CONTRAST CT CERVICAL SPINE WITHOUT CONTRAST TECHNIQUE: Multidetector CT imaging of the head and cervical spine was performed following the standard protocol without intravenous contrast. Multiplanar CT image reconstructions of the cervical spine were also generated. COMPARISON:  None. FINDINGS: CT HEAD FINDINGS Brain: No evidence of acute infarction, hemorrhage, hydrocephalus, extra-axial collection or mass lesion/mass effect. Mild periventricular white matter hypodensity. 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 Vascular: No hyperdense vessel or unexpected calcification. Skull: Normal. Negative for fracture or focal lesion. Sinuses/Orbits: No acute finding. Other: None. CT CERVICAL SPINE FINDINGS Alignment: Normal. Skull base and vertebrae: No acute fracture. No primary bone lesion or focal pathologic process. Soft tissues and spinal canal: No prevertebral fluid or swelling. No visible canal  hematoma. Disc levels: Focally mild disc space height loss and osteophytosis of C5-C6, with otherwise preserved disc spaces. Upper chest: Negative. Other: None. IMPRESSION: 1. No acute intracranial pathology. Small-vessel white matter disease. 2. No fracture or  static subluxation of the cervical spine. Electronically Signed   By: Delanna Ahmadi M.D.   On: 03/07/2021 12:42   ECHOCARDIOGRAM COMPLETE  Result Date: 03/08/2021    ECHOCARDIOGRAM REPORT   Patient Name:   [facility] Date of Exam: 03/07/2021 Medical Rec #:  [id number]    Height:       66.0 in Accession #:    [id number]   Weight:       173.0 lb Date of Birth:  [birth year]   BSA:          1.880 m Patient Age:    43 years     BP:           90/53 mmHg Patient Gender: F            HR:           85 bpm. Exam Location:  [facility] Procedure: 2D Echo, Cardiac Doppler and Color Doppler Indications:     R55 Syncope  History:         Patient has prior history of Echocardiogram examinations, most                  recent 05/01/2020. Risk Factors:Hypertension and Diabetes.                  Chronic kidney disease.  Sonographer:     Cresenciano Lick RDCS Referring Phys:  [id number] AMY N COX Diagnosing Phys: Isaias Cowman MD IMPRESSIONS  1. Left ventricular ejection fraction, by estimation, is 60 to 65%. The left ventricle has normal function. The left ventricle has no regional wall motion abnormalities. There is moderate left ventricular hypertrophy. Left ventricular diastolic parameters are consistent with Grade I diastolic dysfunction (impaired relaxation).  2. Right ventricular systolic function is normal. The right ventricular size is normal.  3. The mitral valve is normal in structure. Mild mitral valve regurgitation. No evidence of mitral stenosis.  4. The aortic valve is normal in structure. Aortic valve regurgitation is not visualized. Mild aortic valve stenosis.  5. The inferior vena cava is normal in size with greater than 50% respiratory variability,  suggesting right atrial pressure of 3 mmHg. FINDINGS  Left Ventricle: Left ventricular ejection fraction, by estimation, is 60 to 65%. The left ventricle has normal function. The left ventricle has no regional wall motion abnormalities. The left ventricular internal cavity size was normal in size. There is  moderate left ventricular hypertrophy. Left ventricular diastolic parameters are consistent with Grade I diastolic dysfunction (impaired relaxation). Right Ventricle: The right ventricular size is normal. No increase in right ventricular wall thickness. Right ventricular systolic function is normal. Left Atrium: Left atrial size was normal in size. Right Atrium: Right atrial size was normal in size. Pericardium: There is no evidence of pericardial effusion. Mitral Valve: The mitral valve is normal in structure. Mild mitral valve regurgitation. No evidence of mitral valve stenosis. Tricuspid Valve: The tricuspid valve is normal in structure. Tricuspid valve regurgitation is mild . No evidence of tricuspid stenosis. Aortic Valve: The aortic valve is normal in structure. Aortic valve regurgitation is not visualized. Mild aortic stenosis is present. Aortic valve mean gradient measures 9.5 mmHg. Aortic valve peak gradient measures 14.9 mmHg. Aortic valve area, by VTI measures 1.46 cm. Pulmonic Valve: The pulmonic valve was normal in structure. Pulmonic valve regurgitation is not visualized. No evidence of pulmonic stenosis. Aorta: The aortic root is normal in size and structure. Venous: The inferior vena cava is normal in size with greater  than 50% respiratory variability, suggesting right atrial pressure of 3 mmHg. IAS/Shunts: No atrial level shunt detected by color flow Doppler.  LEFT VENTRICLE PLAX 2D LVIDd:         2.70 cm   Diastology LVIDs:         1.90 cm   LV e' medial:    5.84 cm/s LV PW:         1.60 cm   LV E/e' medial:  13.7 LV IVS:        1.60 cm   LV e' lateral:   4.30 cm/s LVOT diam:     1.80 cm   LV  E/e' lateral: 18.6 LV SV:         54 LV SV Index:   29 LVOT Area:     2.54 cm  RIGHT VENTRICLE RV Basal diam:  4.00 cm RV S prime:     9.57 cm/s TAPSE (M-mode): 2.8 cm LEFT ATRIUM             Index        RIGHT ATRIUM           Index LA diam:        4.00 cm 2.13 cm/m   RA Area:     14.10 cm LA Vol (A2C):   39.7 ml 21.11 ml/m  RA Volume:   37.50 ml  19.94 ml/m LA Vol (A4C):   42.1 ml 22.39 ml/m LA Biplane Vol: 42.6 ml 22.66 ml/m  AORTIC VALVE AV Area (Vmax):    1.35 cm AV Area (Vmean):   1.43 cm AV Area (VTI):     1.46 cm AV Vmax:           192.75 cm/s AV Vmean:          146.000 cm/s AV VTI:            0.372 m AV Peak Grad:      14.9 mmHg AV Mean Grad:      9.5 mmHg LVOT Vmax:         102.50 cm/s LVOT Vmean:        82.100 cm/s LVOT VTI:          0.213 m LVOT/AV VTI ratio: 0.57  AORTA Ao Root diam: 3.70 cm MITRAL VALVE MV Area (PHT): 2.80 cm     SHUNTS MV Decel Time: 271 msec     Systemic VTI:  0.21 m MV E velocity: 79.90 cm/s   Systemic Diam: 1.80 cm MV A velocity: 115.00 cm/s MV E/A ratio:  0.69 Isaias Cowman MD Electronically signed by Isaias Cowman MD Signature Date/Time: 03/08/2021/1:45:22 PM    Final     Assessment/Plan Katie [name] is a 65 year old female with medical history significant for hypertension, end-stage renal disease on hemodialysis (Tu, Th, Sa), heart failure preserved ejection fraction, chronic low back pain, presents emergency department for chief concerns of frequent falls and syncope after dialysis.  1.  End-Stage Renal Disease: Patient with known history of end-stage renal disease currently maintained via a right IJ PermCath on hemodialysis.  Patient has been dialyzing for approximately 1 year and has not undergone any formal work-up for creation of either fistula or graft.  We were consulted by the nephrology service and attempt to create an access before her discharge.  I have ordered bilateral upper extremity vein mapping to size her veins if adequate we will  move forward with AV fistula if her veins are too small we would have to  use a graft.  The patient expresses her understanding.  Once I have the results of the vein mapping I can go into more detail of which procedure we would move forward with.  Tentatively planning on surgery Thursday with Dr. Lucky Cowboy  2.  Type 2 Diabetes: Diet controlled? Patient on sliding scale insulin coverage while inpatient. Encouraged good control as its slows the progression of atherosclerotic disease   3.  Hypertension: Prescribed the appropriate medications however unsure how compliant the patient is. Encouraged good control as its slows the progression of atherosclerotic disease.   Sela Hua, PA-C 03/13/2021 12:18 PM  This note was created with Dragon medical transcription system.  Any error is purely unintentional.

## 2021-03-13 NOTE — Procedures (Signed)
Update, currently waiting on clinical acceptance.

## 2021-03-13 NOTE — Progress Notes (Signed)
Physical Therapy Treatment Patient Details Name: Katie [name] MRN: [id number] DOB: 1955/09/14 Today's Date: 03/13/2021   History of Present Illness Pt is a 65 y.o. female with medical history significant for hypertension, DM, end-stage renal disease on hemodialysis TuThSa, heart failure preserved ejection fraction, chronic low back pain, presents emergency department for chief concerns of frequent falls and syncope after dialysis. Pt struggles with homelessness and finanacial difficulty. MD assessment includes: increased falls and weakness, depression/anxiety, insominia, and HAs.    PT Comments    Pt was pleasant and motivated to participate during the session and put forth good effort throughout. Pt was able to complete there ex in bed and EOB with no symptoms, but upon standing experienced dizziness.  Per conversation with MD, okay to push pt past orthostatic symptoms to pts tolerance while in standing. Pt still experiencing orthostatics: 143/66 BP and 72 HR supine, 124/62 BP and 75 HR in sitting EOB, 87/53 BP and 84 HR 0 mins standing, and 94/55 BP and 90 HR 3 mins in standing. After marching place and taking several small steps, pt needed to sit down due to fatigue and weakness, but no due to orthostatic symptoms. Pt will benefit from PT services in a SNF setting upon discharge to safely address deficits listed in patient problem list for decreased caregiver assistance and eventual return to PLOF.    Recommendations for follow up therapy are one component of a multi-disciplinary discharge planning process, led by the attending physician.  Recommendations may be updated based on patient status, additional functional criteria and insurance authorization.  Follow Up Recommendations  Skilled nursing-short term rehab (<3 hours/day)     Assistance Recommended at Discharge Intermittent Supervision/Assistance  Equipment Recommendations  None recommended by PT    Recommendations for Other Services        Precautions / Restrictions Precautions Precautions: Fall Restrictions Weight Bearing Restrictions: No Other Position/Activity Restrictions: Watch orthostatics     Mobility  Bed Mobility Overal bed mobility: Modified Independent Bed Mobility: Supine to Sit;Sit to Supine     Supine to sit: Supervision;HOB elevated Sit to supine: Supervision   General bed mobility comments: Extra time and effort    Transfers Overall transfer level: Needs assistance Equipment used: Rolling walker (2 wheels) Transfers: Sit to/from Stand Sit to Stand: Min guard           General transfer comment: Extra time and effort    Ambulation/Gait Ambulation/Gait assistance: Min guard Gait Distance (Feet): 2 Feet Assistive device: Rolling walker (2 wheels) Gait Pattern/deviations: Step-to pattern;Decreased step length - right;Decreased step length - left Gait velocity: decreased   General Gait Details: Side stepping beside bed   Stairs             Wheelchair Mobility    Modified Rankin (Stroke Patients Only)       Balance Overall balance assessment: Needs assistance Sitting-balance support: Bilateral upper extremity supported;Feet supported Sitting balance-Leahy Scale: Good     Standing balance support: Bilateral upper extremity supported;During functional activity Standing balance-Leahy Scale: Fair Standing balance comment: Use of RW for minor stability                            Cognition Arousal/Alertness: Awake/alert Behavior During Therapy: WFL for tasks assessed/performed Overall Cognitive Status: Within Functional Limits for tasks assessed  Exercises Total Joint Exercises Ankle Circles/Pumps: AROM;Strengthening;Both;10 reps Quad Sets: AROM;Strengthening;Both;15 reps Gluteal Sets: AROM;Strengthening;Both;10 reps Towel Squeeze: AROM;Strengthening;Both;10 reps;Seated (used pillow) Straight  Leg Raises: AROM;Strengthening;Both;10 reps Long Arc Quad: AROM;Strengthening;Both;10 reps (in sitting) General Exercises - Lower Extremity Hip Flexion/Marching: AROM;Seated;Strengthening;Both;10 reps Other Exercises Other Exercises: HEP review Other Exercises: Education on pushing through dizziness upon standing per MD    General Comments        Pertinent Vitals/Pain Pain Assessment: 0-10 Pain Score: 6  Pain Location: Head and back Pain Descriptors / Indicators: Aching;Sore;Grimacing Pain Intervention(s): Repositioned    Home Living                          Prior Function            PT Goals (current goals can now be found in the care plan section) Acute Rehab PT Goals Patient Stated Goal: Improved strength and balance and return to independence PT Goal Formulation: With patient Time For Goal Achievement: 03/20/21 Potential to Achieve Goals: Good Progress towards PT goals: Progressing toward goals    Frequency    Min 2X/week      PT Plan Current plan remains appropriate    Co-evaluation              AM-PAC PT "6 Clicks" Mobility   Outcome Measure  Help needed turning from your back to your side while in a flat bed without using bedrails?: A Little Help needed moving from lying on your back to sitting on the side of a flat bed without using bedrails?: A Little Help needed moving to and from a bed to a chair (including a wheelchair)?: A Little Help needed standing up from a chair using your arms (e.g., wheelchair or bedside chair)?: A Little Help needed to walk in hospital room?: A Lot Help needed climbing 3-5 steps with a railing? : A Lot 6 Click Score: 16    End of Session Equipment Utilized During Treatment: Gait belt Activity Tolerance: Patient limited by pain;Other (comment) (Limited by weakness) Patient left: in bed;with call bell/phone within reach;with bed alarm set Nurse Communication: Mobility status (Updated on orthostaics and MD  response) PT Visit Diagnosis: Unsteadiness on feet (R26.81);History of falling (Z91.81);Muscle weakness (generalized) (M62.81);Difficulty in walking, not elsewhere classified (R26.2);Pain     Time: 7564-3329 PT Time Calculation (min) (ACUTE ONLY): 32 min  Charges:                        Sheldon Silvan SPT 03/13/21, 4:24 PM

## 2021-03-13 NOTE — Progress Notes (Signed)
[facility]  ROUNDING NOTE   Subjective:   Katie [name] is a 65 year old female with past medical history including heart failure with preserved EF, chronic back pain chronic hypertension, and end-stage renal disease on hemodialysis. She has been admitted for ESRD (end stage renal disease) ([place]) [N18.6] Frequent falls [R29.6] Recurrent falls [R29.6] Syncope, unspecified syncope type [R55]   Patient seen sitting up in bed. Eating breakfast Denies nausea and vomiting Continues to complain of constipation    Objective:  Vital signs in last 24 hours:  Temp:  [98.3 F (36.8 C)-99.1 F (37.3 C)] 98.7 F (37.1 C) (10/31 0824) Pulse Rate:  [72-75] 72 (10/31 0824) Resp:  [12-18] 16 (10/31 0824) BP: (103-159)/(62-74) 144/74 (10/31 0824) SpO2:  [93 %-97 %] 95 % (10/31 0824)  Weight change:  Filed Weights   03/09/21 1218 03/11/21 1302 03/11/21 1625  Weight: 82.7 kg 83.9 kg 84 kg    Intake/Output: I/O last 3 completed shifts: In: -  Out: 400 [Urine:400]   Intake/Output this shift:  Total I/O In: -  Out: 300 [Urine:300]  Physical Exam: General: Resting in bed, in no acute distress  Head:  Moist oral mucosal membranes  Lungs:  Respirations even,unlabored   Heart: S1S2, no rubs or gallops  Abdomen:  Soft, nontender,non distended  Extremities:  No peripheral edema.  Neurologic: Awake,alert,oriented  Skin: No acute  lesions or rashes  Access: Rt chest Permcath    Basic Metabolic Panel: Recent Labs  Lab 03/06/21 0940 03/07/21 1114 03/08/21 0633  NA 137 135 134*  K 4.2 3.3* 4.3  CL 102 97* 99  CO2 20* 26 25  GLUCOSE 104* 107* 94  BUN 48* 15 40*  CREATININE 5.53* 2.68* 4.91*  CALCIUM 8.8* 8.7* 8.2*     Liver Function Tests: Recent Labs  Lab 03/07/21 1114  AST 22  ALT 20  ALKPHOS 94  BILITOT 0.9  PROT 7.5  ALBUMIN 3.6    No results for input(s): LIPASE, AMYLASE in the last 168 hours. No results for input(s): AMMONIA in the last 168  hours.  CBC: Recent Labs  Lab 03/06/21 0940 03/07/21 1114 03/08/21 0633  WBC 7.9 8.7 6.2  HGB 11.7* 12.2 10.4*  HCT 35.7* 36.2 31.7*  MCV 99.2 97.6 98.4  PLT 332 316 277     Cardiac Enzymes: No results for input(s): CKTOTAL, CKMB, CKMBINDEX, TROPONINI in the last 168 hours.  BNP: Invalid input(s): POCBNP  CBG: Recent Labs  Lab 03/12/21 0810 03/12/21 1206 03/12/21 1706 03/12/21 2144 03/13/21 0827  GLUCAP 86 99 99 120* 89     Microbiology: Results for orders placed or performed during the hospital encounter of 03/07/21  Resp Panel by RT-PCR (Flu A&B, Covid) Nasopharyngeal Swab     Status: None   Collection Time: 03/07/21 12:55 PM   Specimen: Nasopharyngeal Swab; Nasopharyngeal(NP) swabs in vial transport medium  Result Value Ref Range Status   SARS Coronavirus 2 by RT PCR NEGATIVE NEGATIVE Final    Comment: (NOTE) SARS-CoV-2 target nucleic acids are NOT DETECTED.  The SARS-CoV-2 RNA is generally detectable in upper respiratory specimens during the acute phase of infection. The lowest concentration of SARS-CoV-2 viral copies this assay can detect is 138 copies/mL. A negative result does not preclude SARS-Cov-2 infection and should not be used as the sole basis for treatment or other patient management decisions. A negative result may occur with  improper specimen collection/handling, submission of specimen other than nasopharyngeal swab, presence of viral mutation(s) within the areas targeted  by this assay, and inadequate number of viral copies(<138 copies/mL). A negative result must be combined with clinical observations, patient history, and epidemiological information. The expected result is Negative.  Fact Sheet for Patients:  EntrepreneurPulse.com.au  Fact Sheet for Healthcare Providers:  IncredibleEmployment.be  This test is no t yet approved or cleared by the [place] FDA and  has been authorized for detection  and/or diagnosis of SARS-CoV-2 by FDA under an Emergency Use Authorization (EUA). This EUA will remain  in effect (meaning this test can be used) for the duration of the COVID-19 declaration under Section 564(b)(1) of the Act, 21 U.S.C.section 360bbb-3(b)(1), unless the authorization is terminated  or revoked sooner.       Influenza A by PCR NEGATIVE NEGATIVE Final   Influenza B by PCR NEGATIVE NEGATIVE Final    Comment: (NOTE) The Xpert Xpress SARS-CoV-2/FLU/RSV plus assay is intended as an aid in the diagnosis of influenza from Nasopharyngeal swab specimens and should not be used as a sole basis for treatment. Nasal washings and aspirates are unacceptable for Xpert Xpress SARS-CoV-2/FLU/RSV testing.  Fact Sheet for Patients: EntrepreneurPulse.com.au  Fact Sheet for Healthcare Providers: IncredibleEmployment.be  This test is not yet approved or cleared by the [place] FDA and has been authorized for detection and/or diagnosis of SARS-CoV-2 by FDA under an Emergency Use Authorization (EUA). This EUA will remain in effect (meaning this test can be used) for the duration of the COVID-19 declaration under Section 564(b)(1) of the Act, 21 U.S.C. section 360bbb-3(b)(1), unless the authorization is terminated or revoked.  Performed at [facility], [address] [place], [place] [address]     Coagulation Studies: No results for input(s): LABPROT, INR in the last 72 hours.  Urinalysis: No results for input(s): COLORURINE, LABSPEC, PHURINE, GLUCOSEU, HGBUR, BILIRUBINUR, KETONESUR, PROTEINUR, UROBILINOGEN, NITRITE, LEUKOCYTESUR in the last 72 hours.  Invalid input(s): APPERANCEUR    Imaging: No results found.   Medications:     carvedilol  12.5 mg Oral BID WC   Chlorhexidine Gluconate Cloth  6 each Topical Daily   heparin  5,000 Units Subcutaneous Q8H   insulin aspart  0-5 Units Subcutaneous QHS   insulin aspart  0-6  Units Subcutaneous TID WC   lidocaine  1 patch Transdermal Q24H   polyethylene glycol  17 g Oral Daily   senna-docusate  1 tablet Oral QHS   traZODone  50 mg Oral QPM   venlafaxine  37.5 mg Oral BID WC   guaiFENesin, ondansetron **OR** ondansetron (ZOFRAN) IV, oxyCODONE, topiramate  Assessment/ Plan:  Katie [name] is a 65 y.o.  female with past medical history including heart failure with preserved EF, chronic back pain chronic hypertension, and end-stage renal disease on hemodialysis. She has been admitted for ESRD (end stage renal disease) ([place]) [N18.6] Frequent falls [R29.6] Recurrent falls [R29.6] Syncope, unspecified syncope type [R55]  [facility]/TTS/Rt Permcath/84 kg  #End-stage renal disease on hemodialysis.   Next treatment scheduled for Tuesday Fistula placement discussed with patient, vascular agreeable to place if patient remains inpatient on Thursday.    #Anemia of chronic kidney disease Lab Results  Component Value Date   HGB 10.4 (L) 03/08/2021  Hgb at target Will continue monitoring CBCs  #Secondary Hyperparathyroidism: Lab Results  Component Value Date   PTH 70 (H) 05/12/2020   CALCIUM 8.2 (L) 03/08/2021   PHOS 2.7 06/27/2020  Will continue monitoring bone mineral metabolism parameters  #Chronic diastolic heart failure: ECHO from 03/07/21 shows EF of 60-65%. No  acute respiratory distress noted  #Diabetes with CKD Lab Results  Component Value Date   HGBA1C 5.1 03/07/2021  Glucose stable Continue management per primary team   LOS: [place] 10/31/20229:39 AM

## 2021-03-13 NOTE — Progress Notes (Signed)
PROGRESS NOTE    Katie [name]  OXB:[id number] DOB: 1955-10-24 DOA: 03/07/2021 PCP: Patient, [name] (Inactive)    Brief Narrative:  Katie [name] is a 65 y.o. female with medical history significant for hypertension, end-stage renal disease on hemodialysis TuThSa, heart failure preserved ejection fraction, chronic low back pain, presents emergency department for chief concerns of frequent falls and syncope after dialysis.  10/26 bp better. PT rec. Snf 10/27 in dialysis this AM no chest pain, dizziness or hypotension.  Held her carvedilol a.m. dose prior to dialysis. 10/29 some dizziness no other complaints 10/30 last BM Tuesday. No other complaints 10/31 snf pending  Consultants:  nephrology  Procedures:   Antimicrobials:    Subjective: No sob, cp, lightheadedness. Little dizzy when stands  Objective: Vitals:   03/12/21 1705 03/12/21 1955 03/13/21 0555 03/13/21 0824  BP: (!) 159/74 103/62 (!) 153/73 (!) 144/74  Pulse: 75 73 74 72  Resp: 12 18 16 16   Temp: 98.6 F (37 C) 98.3 F (36.8 C) 99.1 F (37.3 C) 98.7 F (37.1 C)  TempSrc: Oral Oral Oral Oral  SpO2: 97% 95% 93% 95%  Weight:      Height:        Intake/Output Summary (Last 24 hours) at 03/13/2021 0845 Last data filed at 03/13/2021 0800 Gross per 24 hour  Intake --  Output 300 ml  Net -300 ml   Filed Weights   03/09/21 1218 03/11/21 1302 03/11/21 1625  Weight: 82.7 kg 83.9 kg 84 kg    Examination: Nad, calm Cta no w/r Regular s1/s2 no gallop Soft benign +bs No edema aaoxox3  Data Reviewed: I have personally reviewed following labs and imaging studies  CBC: Recent Labs  Lab 03/06/21 0940 03/07/21 1114 03/08/21 0633  WBC 7.9 8.7 6.2  HGB 11.7* 12.2 10.4*  HCT 35.7* 36.2 31.7*  MCV 99.2 97.6 98.4  PLT 332 316 683   Basic Metabolic Panel: Recent Labs  Lab 03/06/21 0940 03/07/21 1114 03/08/21 0633  NA 137 135 134*  K 4.2 3.3* 4.3  CL 102 97* 99  CO2 20* 26 25  GLUCOSE 104* 107*  94  BUN 48* 15 40*  CREATININE 5.53* 2.68* 4.91*  CALCIUM 8.8* 8.7* 8.2*   GFR: Estimated Creatinine Clearance: 13 mL/min (A) (by C-G formula based on SCr of 4.91 mg/dL (H)). Liver Function Tests: Recent Labs  Lab 03/07/21 1114  AST 22  ALT 20  ALKPHOS 94  BILITOT 0.9  PROT 7.5  ALBUMIN 3.6   No results for input(s): LIPASE, AMYLASE in the last 168 hours. No results for input(s): AMMONIA in the last 168 hours. Coagulation Profile: No results for input(s): INR, PROTIME in the last 168 hours. Cardiac Enzymes: No results for input(s): CKTOTAL, CKMB, CKMBINDEX, TROPONINI in the last 168 hours. BNP (last 3 results) No results for input(s): PROBNP in the last 8760 hours. HbA1C: No results for input(s): HGBA1C in the last 72 hours.  CBG: Recent Labs  Lab 03/12/21 0810 03/12/21 1206 03/12/21 1706 03/12/21 2144 03/13/21 0827  GLUCAP 86 99 99 120* 89   Lipid Profile: No results for input(s): CHOL, HDL, LDLCALC, TRIG, CHOLHDL, LDLDIRECT in the last 72 hours. Thyroid Function Tests: No results for input(s): TSH, T4TOTAL, FREET4, T3FREE, THYROIDAB in the last 72 hours.  Anemia Panel: No results for input(s): VITAMINB12, FOLATE, FERRITIN, TIBC, IRON, RETICCTPCT in the last 72 hours.  Sepsis Labs: Recent Labs  Lab 03/07/21 1135 03/07/21 1320 03/07/21 1419  PROCALCITON  --  0.56  --  LATICACIDVEN 1.8  --  1.0    Recent Results (from the past 240 hour(s))  Resp Panel by RT-PCR (Flu A&B, Covid) Nasopharyngeal Swab     Status: None   Collection Time: 03/07/21 12:55 PM   Specimen: Nasopharyngeal Swab; Nasopharyngeal(NP) swabs in vial transport medium  Result Value Ref Range Status   SARS Coronavirus 2 by RT PCR NEGATIVE NEGATIVE Final    Comment: (NOTE) SARS-CoV-2 target nucleic acids are NOT DETECTED.  The SARS-CoV-2 RNA is generally detectable in upper respiratory specimens during the acute phase of infection. The lowest concentration of SARS-CoV-2 viral copies this  assay can detect is 138 copies/mL. A negative result does not preclude SARS-Cov-2 infection and should not be used as the sole basis for treatment or other patient management decisions. A negative result may occur with  improper specimen collection/handling, submission of specimen other than nasopharyngeal swab, presence of viral mutation(s) within the areas targeted by this assay, and inadequate number of viral copies(<138 copies/mL). A negative result must be combined with clinical observations, patient history, and epidemiological information. The expected result is Negative.  Fact Sheet for Patients:  EntrepreneurPulse.com.au  Fact Sheet for Healthcare Providers:  IncredibleEmployment.be  This test is no t yet approved or cleared by the [place] FDA and  has been authorized for detection and/or diagnosis of SARS-CoV-2 by FDA under an Emergency Use Authorization (EUA). This EUA will remain  in effect (meaning this test can be used) for the duration of the COVID-19 declaration under Section 564(b)(1) of the Act, 21 U.S.C.section 360bbb-3(b)(1), unless the authorization is terminated  or revoked sooner.       Influenza A by PCR NEGATIVE NEGATIVE Final   Influenza B by PCR NEGATIVE NEGATIVE Final    Comment: (NOTE) The Xpert Xpress SARS-CoV-2/FLU/RSV plus assay is intended as an aid in the diagnosis of influenza from Nasopharyngeal swab specimens and should not be used as a sole basis for treatment. Nasal washings and aspirates are unacceptable for Xpert Xpress SARS-CoV-2/FLU/RSV testing.  Fact Sheet for Patients: EntrepreneurPulse.com.au  Fact Sheet for Healthcare Providers: IncredibleEmployment.be  This test is not yet approved or cleared by the [place] FDA and has been authorized for detection and/or diagnosis of SARS-CoV-2 by FDA under an Emergency Use Authorization (EUA). This EUA will  remain in effect (meaning this test can be used) for the duration of the COVID-19 declaration under Section 564(b)(1) of the Act, 21 U.S.C. section 360bbb-3(b)(1), unless the authorization is terminated or revoked.  Performed at [facility], [address], [place], [place] [address]          Radiology Studies: No results found.      Scheduled Meds:  carvedilol  12.5 mg Oral BID WC   Chlorhexidine Gluconate Cloth  6 each Topical Daily   heparin  5,000 Units Subcutaneous Q8H   insulin aspart  0-5 Units Subcutaneous QHS   insulin aspart  0-6 Units Subcutaneous TID WC   lidocaine  1 patch Transdermal Q24H   polyethylene glycol  17 g Oral Daily   senna-docusate  1 tablet Oral QHS   traZODone  50 mg Oral QPM   venlafaxine  37.5 mg Oral BID WC   Continuous Infusions:    Assessment & Plan:   Active Problems:   Hypertension   Type 2 diabetes mellitus (HCC)   Chronic kidney disease (CKD), stage IV (severe) ([place])   Pulmonary hypertension, unspecified ([place])   ESRD on dialysis ([place])   Frequent falls  Homelessness   Financial difficulty   # Increased fall and weakness- Likely secondary to postdialysis treatment and hypotension B12, B1, vitamin D and procalcitonin was ordered for work-up Echo ordered.  Previous echo on 12/21 revealed normal EF Fall precautions 10/29 PT recommended SNF 10/30 SNF pending Also needs to change HD to [facility]        # End-stage renal disease on hemodialysis - Patient completed hemodialysis session on 03/07/2021 10/31 nephrology following Dialysis tomorrow Working on setting her up with dialysis at the [facility] now     #Constipation Continue bowel regimen    # Hypertension- Continue carvedilol.  May need to hold a.m. dose prior to dialysis on dialysis days due to avoiding hypotension  10/31 continue to monitor    # Depression/anxiety-continue venlafaxine        # Insomnia-trazodone  50 mg nightly    # Headaches-topiramate 50 mg p.o. twice daily as needed for headaches   # History of diabetes type 2-presumed improved with end-stage renal disease BG stable Continue RISS    DVT prophylaxis: Heparin Code Status: Full Family Communication: None at bedside Disposition Plan: SNF Status is: Inpatient  Remains inpatient appropriate because: Unsafe discharge.  Needs SNF placement.  Case management working on this.  Patient is medically stable for discharge.        LOS: 5 days   Time spent: 35 minutes with more than 50% on [place], MD [facility] Pager [phone]  If 7PM-7AM, please contact night-coverage 03/13/2021, 8:45 AM

## 2021-03-13 NOTE — TOC Progression Note (Signed)
Transition of Care [facility]) - Progression Note    Patient Details  Name: Katie [name] MRN: [id number] Date of Birth: 09/14/1955  Transition of Care [facility]) CM/SW [place], LCSW Phone Number: 03/13/2021, 2:37 PM  Clinical Narrative:   Left message for [facility] admissions coordinator to check insurance auth status.  Expected Discharge Plan: [place] Barriers to Discharge: Continued Medical Work up  Expected Discharge Plan and Services Expected Discharge Plan: [place] arrangements for the past 2 months: Single Family Home                                       Social Determinants of Health (SDOH) Interventions    Readmission Risk Interventions Readmission Risk Prevention Plan 03/08/2021  Transportation Screening Complete  Medication Review Press photographer) Complete  HRI or Economy Complete

## 2021-03-14 DIAGNOSIS — N184 Chronic kidney disease, stage 4 (severe): Secondary | ICD-10-CM | POA: Diagnosis not present

## 2021-03-14 DIAGNOSIS — R296 Repeated falls: Secondary | ICD-10-CM | POA: Diagnosis not present

## 2021-03-14 DIAGNOSIS — I10 Essential (primary) hypertension: Secondary | ICD-10-CM | POA: Diagnosis not present

## 2021-03-14 DIAGNOSIS — N186 End stage renal disease: Secondary | ICD-10-CM | POA: Diagnosis not present

## 2021-03-14 DIAGNOSIS — I5032 Chronic diastolic (congestive) heart failure: Secondary | ICD-10-CM | POA: Insufficient documentation

## 2021-03-14 DIAGNOSIS — Z87891 Personal history of nicotine dependence: Secondary | ICD-10-CM | POA: Insufficient documentation

## 2021-03-14 DIAGNOSIS — Z992 Dependence on renal dialysis: Secondary | ICD-10-CM | POA: Insufficient documentation

## 2021-03-14 DIAGNOSIS — N2581 Secondary hyperparathyroidism of renal origin: Secondary | ICD-10-CM | POA: Insufficient documentation

## 2021-03-14 DIAGNOSIS — D631 Anemia in chronic kidney disease: Secondary | ICD-10-CM | POA: Insufficient documentation

## 2021-03-14 DIAGNOSIS — I953 Hypotension of hemodialysis: Secondary | ICD-10-CM

## 2021-03-14 DIAGNOSIS — Z880 Allergy status to penicillin: Secondary | ICD-10-CM | POA: Insufficient documentation

## 2021-03-14 DIAGNOSIS — F32A Depression, unspecified: Secondary | ICD-10-CM | POA: Insufficient documentation

## 2021-03-14 DIAGNOSIS — I132 Hypertensive heart and chronic kidney disease with heart failure and with stage 5 chronic kidney disease, or end stage renal disease: Secondary | ICD-10-CM | POA: Insufficient documentation

## 2021-03-14 LAB — CBC
HCT: 31 % — ABNORMAL LOW (ref 36.0–46.0)
Hemoglobin: 9.8 g/dL — ABNORMAL LOW (ref 12.0–15.0)
MCH: 31 pg (ref 26.0–34.0)
MCHC: 31.6 g/dL (ref 30.0–36.0)
MCV: 98.1 fL (ref 80.0–100.0)
Platelets: 257 10*3/uL (ref 150–400)
RBC: 3.16 MIL/uL — ABNORMAL LOW (ref 3.87–5.11)
RDW: 12.8 % (ref 11.5–15.5)
WBC: 5.6 10*3/uL (ref 4.0–10.5)
nRBC: 0 % (ref 0.0–0.2)

## 2021-03-14 LAB — GLUCOSE, CAPILLARY
Glucose-Capillary: 85 mg/dL (ref 70–99)
Glucose-Capillary: 124 mg/dL — ABNORMAL HIGH (ref 70–99)
Glucose-Capillary: 90 mg/dL (ref 70–99)

## 2021-03-14 MED ORDER — INSULIN ASPART 100 UNIT/ML IJ SOLN: 0.0000 [IU] | Freq: Every day | INTRAMUSCULAR | Status: DC

## 2021-03-14 MED ORDER — HEPARIN SODIUM (PORCINE) 1000 UNIT/ML IJ SOLN
INTRAMUSCULAR | Status: AC
  Filled 2021-03-14: qty 1

## 2021-03-14 MED ORDER — MIDODRINE HCL 5 MG PO TABS
5.0000 mg | ORAL_TABLET | Freq: Three times a day (TID) | ORAL | Status: DC
  Administered 2021-03-14 – 2021-03-19 (×14): 5 mg via ORAL
  Filled 2021-03-14 (×17): qty 1

## 2021-03-14 MED ORDER — LACTULOSE 10 GM/15ML PO SOLN
15.0000 g | Freq: Every day | ORAL | Status: DC
  Administered 2021-03-14 – 2021-03-20 (×5): 15 g via ORAL
  Filled 2021-03-14 (×5): qty 30

## 2021-03-14 MED ORDER — TRAZODONE HCL 50 MG PO TABS
50.0000 mg | ORAL_TABLET | Freq: Every day | ORAL | Status: DC
  Administered 2021-03-14 – 2021-03-20 (×7): 50 mg via ORAL
  Filled 2021-03-14 (×7): qty 1

## 2021-03-14 MED ORDER — CARVEDILOL 12.5 MG PO TABS
12.5000 mg | ORAL_TABLET | Freq: Every evening | ORAL | Status: DC
  Administered 2021-03-15 – 2021-03-20 (×6): 12.5 mg via ORAL
  Filled 2021-03-14 (×6): qty 1

## 2021-03-14 NOTE — Progress Notes (Addendum)
[facility]  ROUNDING NOTE   Subjective:   Gerica Koble is a 65 year old female with past medical history including heart failure with preserved EF, chronic back pain chronic hypertension, and end-stage renal disease on hemodialysis. She has been admitted for ESRD (end stage renal disease) ([place]) [N18.6] Frequent falls [R29.6] Recurrent falls [R29.6] Syncope, unspecified syncope type [R55]   Patient seen eating breakfast Denies nausea and vomiting States she feels well today No other complaints at this time   Objective:  Vital signs in last 24 hours:  Temp:  [98.4 F (36.9 C)-98.9 F (37.2 C)] 98.9 F (37.2 C) (11/01 0752) Pulse Rate:  [68-79] 69 (11/01 0752) Resp:  [16-20] 16 (11/01 0752) BP: (120-169)/(62-90) 120/69 (11/01 0752) SpO2:  [96 %-97 %] 96 % (11/01 0752)  Weight change:  Filed Weights   03/09/21 1218 03/11/21 1302 03/11/21 1625  Weight: 82.7 kg 83.9 kg 84 kg    Intake/Output: I/O last 3 completed shifts: In: 360 [P.O.:360] Out: 300 [Urine:300]   Intake/Output this shift:  Total I/O In: -  Out: 200 [Urine:200]  Physical Exam: General: Resting in bed, in no acute distress  Head:  Moist oral mucosal membranes  Lungs:  Respirations even,unlabored   Heart: S1S2, no rubs or gallops  Abdomen:  Soft, nontender,non distended  Extremities:  No peripheral edema.  Neurologic: Awake,alert,oriented  Skin: No acute  lesions or rashes  Access: Rt chest Permcath    Basic Metabolic Panel: Recent Labs  Lab 03/08/21 0633  NA 134*  K 4.3  CL 99  CO2 25  GLUCOSE 94  BUN 40*  CREATININE 4.91*  CALCIUM 8.2*     Liver Function Tests: No results for input(s): AST, ALT, ALKPHOS, BILITOT, PROT, ALBUMIN in the last 168 hours.  No results for input(s): LIPASE, AMYLASE in the last 168 hours. No results for input(s): AMMONIA in the last 168 hours.  CBC: Recent Labs  Lab 03/08/21 0633  WBC 6.2  HGB 10.4*  HCT 31.7*  MCV 98.4  PLT 277      Cardiac Enzymes: No results for input(s): CKTOTAL, CKMB, CKMBINDEX, TROPONINI in the last 168 hours.  BNP: Invalid input(s): POCBNP  CBG: Recent Labs  Lab 03/13/21 1156 03/13/21 1735 03/13/21 2045 03/14/21 0752 03/14/21 1122  GLUCAP 105* 107* 90 85 124*     Microbiology: Results for orders placed or performed during the hospital encounter of 03/07/21  Resp Panel by RT-PCR (Flu A&B, Covid) Nasopharyngeal Swab     Status: None   Collection Time: 03/07/21 12:55 PM   Specimen: Nasopharyngeal Swab; Nasopharyngeal(NP) swabs in vial transport medium  Result Value Ref Range Status   SARS Coronavirus 2 by RT PCR NEGATIVE NEGATIVE Final    Comment: (NOTE) SARS-CoV-2 target nucleic acids are NOT DETECTED.  The SARS-CoV-2 RNA is generally detectable in upper respiratory specimens during the acute phase of infection. The lowest concentration of SARS-CoV-2 viral copies this assay can detect is 138 copies/mL. A negative result does not preclude SARS-Cov-2 infection and should not be used as the sole basis for treatment or other patient management decisions. A negative result may occur with  improper specimen collection/handling, submission of specimen other than nasopharyngeal swab, presence of viral mutation(s) within the areas targeted by this assay, and inadequate number of viral copies(<138 copies/mL). A negative result must be combined with clinical observations, patient history, and epidemiological information. The expected result is Negative.  Fact Sheet for Patients:  EntrepreneurPulse.com.au  Fact Sheet for Healthcare Providers:  IncredibleEmployment.be  This test is no t yet approved or cleared by the [place] and  has been authorized for detection and/or diagnosis of SARS-CoV-2 by FDA under an Emergency Use Authorization (EUA). This EUA will remain  in effect (meaning this test can be used) for the duration of  the COVID-19 declaration under Section 564(b)(1) of the Act, 21 U.S.C.section 360bbb-3(b)(1), unless the authorization is terminated  or revoked sooner.       Influenza A by PCR NEGATIVE NEGATIVE Final   Influenza B by PCR NEGATIVE NEGATIVE Final    Comment: (NOTE) The Xpert Xpress SARS-CoV-2/FLU/RSV plus assay is intended as an aid in the diagnosis of influenza from Nasopharyngeal swab specimens and should not be used as a sole basis for treatment. Nasal washings and aspirates are unacceptable for Xpert Xpress SARS-CoV-2/FLU/RSV testing.  Fact Sheet for Patients: EntrepreneurPulse.com.au  Fact Sheet for Healthcare Providers: IncredibleEmployment.be  This test is not yet approved or cleared by the [place] FDA and has been authorized for detection and/or diagnosis of SARS-CoV-2 by FDA under an Emergency Use Authorization (EUA). This EUA will remain in effect (meaning this test can be used) for the duration of the COVID-19 declaration under Section 564(b)(1) of the Act, 21 U.S.C. section 360bbb-3(b)(1), unless the authorization is terminated or revoked.  Performed at [facility], [address] [place], [place] [address]   MRSA Next Gen by PCR, Nasal     Status: None   Collection Time: 03/13/21  9:07 AM   Specimen: Nasal Mucosa; Nasal Swab  Result Value Ref Range Status   MRSA by PCR Next Gen NOT DETECTED NOT DETECTED Final    Comment: (NOTE) The GeneXpert MRSA Assay (FDA approved for NASAL specimens only), is one component of a comprehensive MRSA colonization surveillance program. It is not intended to diagnose MRSA infection nor to guide or monitor treatment for MRSA infections. Test performance is not FDA approved in patients less than 38 years old. Performed at [facility], [address] [place],  [address]     Coagulation Studies: No results for input(s): LABPROT, INR in the last 72  hours.  Urinalysis: No results for input(s): COLORURINE, LABSPEC, PHURINE, GLUCOSEU, HGBUR, BILIRUBINUR, KETONESUR, PROTEINUR, UROBILINOGEN, NITRITE, LEUKOCYTESUR in the last 72 hours.  Invalid input(s): APPERANCEUR    Imaging: [place] UE VEIN MAPPING LEFT (PRE-OP AVF)  Result Date: 03/13/2021 CLINICAL DATA:  End-stage renal disease. Please perform bilateral upper extremity vein mapping for dialysis access planning purposes. EXAM: [place] EXTREM UP VEIN MAPPING COMPARISON:  None. FINDINGS: RIGHT ARTERIES Wrist Radial Artery: Size 2 mm Waveform Triphasic Wrist Ulnar Artery: Size 2 mm  Waveform Triphasic Prox. Forearm Radial Artery: Size 2 mm  Waveform Triphasic Upper Arm Brachial Artery: Size 49mm  Waveform Triphasic RIGHT VEINS Forearm Cephalic Vein: Prox - too small (sub 2 mm); Distal - too small (sub 2 mm) Prox - too small (sub 2 mm); Distal - too small (sub 2 mm) Upper Arm Basilic Vein: Prox 4 mm Distal 2 mm Depth 6 mm Upper Arm Brachial Vein: Prox - duplicated - 3 & 4 mm; Distal too small Depth 19 and 15 mm respectively ADDITIONAL RIGHT VEINS Axillary Vein: 8 mm Subclavian Vein: Patient: Yes Respiratory Phasicity: Present Internal Jugular Vein: Patent: Yes    Respiratory Phasicity: Present Branches > 2 mm: None _________________________________________________________ LEFT ARTERIES Wrist Radial Artery: Size 2 mm  Waveform Triphasic Wrist Ulnar Artery: Size 2 mm  Waveform Triphasic Prox. Forearm Radial Artery: Size 2 mm  Waveform Triphasic Upper Arm Brachial Artery: Size 22mm  Waveform Triphasic LEFT VEINS Forearm Cephalic Vein: Prox - too small (sub 2 mm); Distal - too small (sub 2 mm) Upper Arm Cephalic Vein: Prox - too small (sub 2 mm); Distal - too small (sub 2 mm) Upper Arm Basilic Vein: Prox 8 mm Distal 5 mm; Depth 19 mm Upper Arm Brachial Vein: Prox - duplicated - 4 & 2 mm; Distal 2 & 2; depth 21 and 23 mm respectively ADDITIONAL LEFT VEINS Axillary Vein:  46mm Subclavian Vein: Patient: Yes Respiratory  Phasicity: Present Internal Jugular Vein: Patent: Yes    Respiratory Phasicity: Present Branches > 2 mm: None _________________________________________________________ No evidence of DVT or SVT within either upper extremity. IMPRESSION: Bilateral upper extremity venous mapping as above. Electronically Signed   By: Sandi Mariscal M.D.   On: 03/13/2021 14:27   [place] UE VEIN MAPPING RIGHT (PRE-OP AVF)  Result Date: 03/13/2021 CLINICAL DATA:  End-stage renal disease. Please perform bilateral upper extremity vein mapping for dialysis access planning purposes. EXAM: [place] EXTREM UP VEIN MAPPING COMPARISON:  None. FINDINGS: RIGHT ARTERIES Wrist Radial Artery: Size 2 mm Waveform Triphasic Wrist Ulnar Artery: Size 2 mm  Waveform Triphasic Prox. Forearm Radial Artery: Size 2 mm  Waveform Triphasic Upper Arm Brachial Artery: Size 51mm  Waveform Triphasic RIGHT VEINS Forearm Cephalic Vein: Prox - too small (sub 2 mm); Distal - too small (sub 2 mm) Prox - too small (sub 2 mm); Distal - too small (sub 2 mm) Upper Arm Basilic Vein: Prox 4 mm Distal 2 mm Depth 6 mm Upper Arm Brachial Vein: Prox - duplicated - 3 & 4 mm; Distal too small Depth 19 and 15 mm respectively ADDITIONAL RIGHT VEINS Axillary Vein: 8 mm Subclavian Vein: Patient: Yes Respiratory Phasicity: Present Internal Jugular Vein: Patent: Yes    Respiratory Phasicity: Present Branches > 2 mm: None _________________________________________________________ LEFT ARTERIES Wrist Radial Artery: Size 2 mm  Waveform Triphasic Wrist Ulnar Artery: Size 2 mm  Waveform Triphasic Prox. Forearm Radial Artery: Size 2 mm  Waveform Triphasic Upper Arm Brachial Artery: Size 20mm  Waveform Triphasic LEFT VEINS Forearm Cephalic Vein: Prox - too small (sub 2 mm); Distal - too small (sub 2 mm) Upper Arm Cephalic Vein: Prox - too small (sub 2 mm); Distal - too small (sub 2 mm) Upper Arm Basilic Vein: Prox 8 mm Distal 5 mm; Depth 19 mm Upper Arm Brachial Vein: Prox - duplicated - 4 & 2 mm; Distal 2 &  2; depth 21 and 23 mm respectively ADDITIONAL LEFT VEINS Axillary Vein:  63mm Subclavian Vein: Patient: Yes Respiratory Phasicity: Present Internal Jugular Vein: Patent: Yes    Respiratory Phasicity: Present Branches > 2 mm: None _________________________________________________________ No evidence of DVT or SVT within either upper extremity. IMPRESSION: Bilateral upper extremity venous mapping as above. Electronically Signed   By: Sandi Mariscal M.D.   On: 03/13/2021 14:27     Medications:     [START ON 03/15/2021] carvedilol  12.5 mg Oral QPM   Chlorhexidine Gluconate Cloth  6 each Topical Daily   heparin  5,000 Units Subcutaneous Q8H   insulin aspart  0-5 Units Subcutaneous QHS   insulin aspart  0-6 Units Subcutaneous TID WC   lidocaine  1 patch Transdermal Q24H   midodrine  5 mg Oral TID WC   polyethylene glycol  17 g Oral Daily   senna-docusate  1 tablet Oral QHS   traZODone  50 mg Oral QPM   venlafaxine  37.5 mg Oral BID WC   guaiFENesin, ondansetron (ZOFRAN) IV, oxyCODONE, topiramate  Assessment/ Plan:  Ms. Jasha Hodzic is a 65 y.o.  female with past medical history including heart failure with preserved EF, chronic back pain chronic hypertension, and end-stage renal disease on hemodialysis. She has been admitted for ESRD (end stage renal disease) ([place]) [N18.6] Frequent falls [R29.6] Recurrent falls [R29.6] Syncope, unspecified syncope type [R55]  [facility]/TTS/Rt Permcath/84 kg  #End-stage renal disease on hemodialysis.   Dialysis scheduled for later today. Fistula placement discussed with patient, vascular agreeable to place on Thursday. Due to this procedure, will plan for a short dialysis treatment tomorrow. Next treatment scheduled for Friday due to discharge planning. Dialysis coordinator has received confirmation of acceptance from [facility] and they will be prepared for patient on Tuesday.   #Anemia of chronic kidney disease Lab Results  Component  Value Date   HGB 10.4 (L) 03/08/2021  Hgb at target, will draw updated CBC in dialysis   #Secondary Hyperparathyroidism: Lab Results  Component Value Date   PTH 70 (H) 05/12/2020   CALCIUM 8.2 (L) 03/08/2021   PHOS 2.7 06/27/2020  Will continue monitoring bone mineral metabolism parameters  #Chronic diastolic heart failure: ECHO from 03/07/21 shows EF of 60-65%. No acute respiratory distress noted  #Diabetes with CKD Lab Results  Component Value Date   HGBA1C 5.1 03/07/2021  Glucose stable Continue management per primary team  # Orthostatic hypotension, changed to Carvedilol 12.5mg  daily. Will order Midodrine 5mg  three times a day, hold if systolic BP greater than 063   LOS: 6 Barnet Benavides 11/1/202211:42 AM

## 2021-03-14 NOTE — Progress Notes (Signed)
Pt. w/scheduled HD session, ran 2.5 hr. d/t clotting dialyzer. CVC functioned at prescribed BFR and DFR. Targeted UF not met this session., 713 fluid removed. Pt. scheduled next day,  with suggestion of heparinizing dialyzer. Pt. return to room, VSS, afebrile.

## 2021-03-14 NOTE — Progress Notes (Addendum)
Patient accepted at [facility] TTS 12:40am. Can start on Thursday 11/3 11:10am. Update: NP just informed that patient will get an access placed on 11/3, therefore patient will not start in-center until Tuesday 11/8.

## 2021-03-14 NOTE — Progress Notes (Signed)
Patient transferred to HD 

## 2021-03-14 NOTE — Progress Notes (Signed)
PT Cancellation Note  Patient Details Name: Katie [name] MRN: [id number] DOB: 16-Sep-1955   Cancelled Treatment:    Reason Eval/Treat Not Completed: Patient at procedure or test/unavailable: Pt off the floor at HD, will attempt to see pt at a future date/time as medically appropriate.     Linus Salmons PT, DPT 03/14/21, 4:00 PM

## 2021-03-14 NOTE — Progress Notes (Signed)
[facility] Daily Progress Note  Subjective: Patient without complaints this AM.  No acute issues overnight.  Objective: Vitals:   03/13/21 1733 03/13/21 1958 03/14/21 0514 03/14/21 0752  BP: (!) 169/90 (!) 142/70 125/62 120/69  Pulse: 79 74 68 69  Resp: 16 16 20 16   Temp: 98.4 F (36.9 C) 98.4 F (36.9 C) 98.5 F (36.9 C) 98.9 F (37.2 C)  TempSrc: Oral Oral Oral Oral  SpO2: 97% 96% 96% 96%  Weight:      Height:        Intake/Output Summary (Last 24 hours) at 03/14/2021 1038 Last data filed at 03/14/2021 0800 Gross per 24 hour  Intake --  Output 200 ml  Net -200 ml   Physical Exam: A&Ox3, NAD CV: RRR Pulmonary: CTA Bilaterally Abdomen: Soft, Nontender, Nondistended Vascular: Warm distally to toes   Laboratory: CBC    Component Value Date/Time   WBC 6.2 03/08/2021 0633   HGB 10.4 (L) 03/08/2021 0633   HCT 31.7 (L) 03/08/2021 0633   PLT 277 03/08/2021 0633   BMET    Component Value Date/Time   NA 134 (L) 03/08/2021 0633   K 4.3 03/08/2021 0633   CL 99 03/08/2021 0633   CO2 25 03/08/2021 0633   GLUCOSE 94 03/08/2021 0633   BUN 40 (H) 03/08/2021 0633   CREATININE 4.91 (H) 03/08/2021 0633   CALCIUM 8.2 (L) 03/08/2021 0633   GFRNONAA 9 (L) 03/08/2021 0633   GFRAA  01/19/2021 0833    SPECIMEN HEMOLYZED. HEMOLYSIS MAY AFFECT INTEGRITY OF RESULTS.   Assessment/Planning: The patient is a 65 year old female with progressively worsening acute on chronic renal failure  1) Vein mapping notable for smaller veins.  We will plan on left upper extremity dialysis graft creation on Thursday with Dr. Lucky Cowboy 2) The results of the vein mapping and choosing to move forward with the graft were explained to the patient this AM.  Procedure, risk and benefits discussed.  All questions answered.  Patient still wishes to proceed.  Discussed with Dr. Ellis Parents Emmily Pellegrin PA-C 03/14/2021 10:38 AM

## 2021-03-14 NOTE — Progress Notes (Addendum)
PROGRESS NOTE    Katie [name]  UVO:[id number] DOB: Mar 14, 1956 DOA: 03/07/2021 PCP: Patient, [name] (Inactive)    Brief Narrative:  Katie [name] is a 65 y.o. female with medical history significant for hypertension, end-stage renal disease on hemodialysis TuThSa, heart failure preserved ejection fraction, chronic low back pain, presents emergency department for chief concerns of frequent falls and syncope after dialysis.. Found to be hypotensive.  She does have orthostatic hypotension.  Her BP meds were changed and started on midodrine today. PT recommended SNF. 11/1 plan for AVF creation by vascular on Thursday. HD today    Consultants:  Nephrology, vascular still constipated.   Procedures:   Antimicrobials:    Subjective: No sob, cp. Light dizziness when stands to long HD today.  Objective: Vitals:   03/13/21 1733 03/13/21 1958 03/14/21 0514 03/14/21 0752  BP: (!) 169/90 (!) 142/70 125/62 120/69  Pulse: 79 74 68 69  Resp: 16 16 20 16   Temp: 98.4 F (36.9 C) 98.4 F (36.9 C) 98.5 F (36.9 C) 98.9 F (37.2 C)  TempSrc: Oral Oral Oral Oral  SpO2: 97% 96% 96% 96%  Weight:      Height:        Intake/Output Summary (Last 24 hours) at 03/14/2021 0813 Last data filed at 03/14/2021 0800 Gross per 24 hour  Intake 360 ml  Output 400 ml  Net -40 ml   Filed Weights   03/09/21 1218 03/11/21 1302 03/11/21 1625  Weight: 82.7 kg 83.9 kg 84 kg    Examination: Calm, NAD CTA no wheeze rales rhonchi's Regular S1-S2 no gallops Soft benign positive bowel sounds No edema Awake oriented x3  Data Reviewed: I have personally reviewed following labs and imaging studies  CBC: Recent Labs  Lab 03/07/21 1114 03/08/21 0633  WBC 8.7 6.2  HGB 12.2 10.4*  HCT 36.2 31.7*  MCV 97.6 98.4  PLT 316 742   Basic Metabolic Panel: Recent Labs  Lab 03/07/21 1114 03/08/21 0633  NA 135 134*  K 3.3* 4.3  CL 97* 99  CO2 26 25  GLUCOSE 107* 94  BUN 15 40*  CREATININE 2.68*  4.91*  CALCIUM 8.7* 8.2*   GFR: Estimated Creatinine Clearance: 13 mL/min (A) (by C-G formula based on SCr of 4.91 mg/dL (H)). Liver Function Tests: Recent Labs  Lab 03/07/21 1114  AST 22  ALT 20  ALKPHOS 94  BILITOT 0.9  PROT 7.5  ALBUMIN 3.6   No results for input(s): LIPASE, AMYLASE in the last 168 hours. No results for input(s): AMMONIA in the last 168 hours. Coagulation Profile: No results for input(s): INR, PROTIME in the last 168 hours. Cardiac Enzymes: No results for input(s): CKTOTAL, CKMB, CKMBINDEX, TROPONINI in the last 168 hours. BNP (last 3 results) No results for input(s): PROBNP in the last 8760 hours. HbA1C: No results for input(s): HGBA1C in the last 72 hours.  CBG: Recent Labs  Lab 03/13/21 0827 03/13/21 1156 03/13/21 1735 03/13/21 2045 03/14/21 0752  GLUCAP 89 105* 107* 90 85   Lipid Profile: No results for input(s): CHOL, HDL, LDLCALC, TRIG, CHOLHDL, LDLDIRECT in the last 72 hours. Thyroid Function Tests: No results for input(s): TSH, T4TOTAL, FREET4, T3FREE, THYROIDAB in the last 72 hours.  Anemia Panel: No results for input(s): VITAMINB12, FOLATE, FERRITIN, TIBC, IRON, RETICCTPCT in the last 72 hours.  Sepsis Labs: Recent Labs  Lab 03/07/21 1135 03/07/21 1320 03/07/21 1419  PROCALCITON  --  0.56  --   LATICACIDVEN 1.8  --  1.0  Recent Results (from the past 240 hour(s))  Resp Panel by RT-PCR (Flu A&B, Covid) Nasopharyngeal Swab     Status: None   Collection Time: 03/07/21 12:55 PM   Specimen: Nasopharyngeal Swab; Nasopharyngeal(NP) swabs in vial transport medium  Result Value Ref Range Status   SARS Coronavirus 2 by RT PCR NEGATIVE NEGATIVE Final    Comment: (NOTE) SARS-CoV-2 target nucleic acids are NOT DETECTED.  The SARS-CoV-2 RNA is generally detectable in upper respiratory specimens during the acute phase of infection. The lowest concentration of SARS-CoV-2 viral copies this assay can detect is 138 copies/mL. A negative  result does not preclude SARS-Cov-2 infection and should not be used as the sole basis for treatment or other patient management decisions. A negative result may occur with  improper specimen collection/handling, submission of specimen other than nasopharyngeal swab, presence of viral mutation(s) within the areas targeted by this assay, and inadequate number of viral copies(<138 copies/mL). A negative result must be combined with clinical observations, patient history, and epidemiological information. The expected result is Negative.  Fact Sheet for Patients:  EntrepreneurPulse.com.au  Fact Sheet for Healthcare Providers:  IncredibleEmployment.be  This test is no t yet approved or cleared by the [place] FDA and  has been authorized for detection and/or diagnosis of SARS-CoV-2 by FDA under an Emergency Use Authorization (EUA). This EUA will remain  in effect (meaning this test can be used) for the duration of the COVID-19 declaration under Section 564(b)(1) of the Act, 21 U.S.C.section 360bbb-3(b)(1), unless the authorization is terminated  or revoked sooner.       Influenza A by PCR NEGATIVE NEGATIVE Final   Influenza B by PCR NEGATIVE NEGATIVE Final    Comment: (NOTE) The Xpert Xpress SARS-CoV-2/FLU/RSV plus assay is intended as an aid in the diagnosis of influenza from Nasopharyngeal swab specimens and should not be used as a sole basis for treatment. Nasal washings and aspirates are unacceptable for Xpert Xpress SARS-CoV-2/FLU/RSV testing.  Fact Sheet for Patients: EntrepreneurPulse.com.au  Fact Sheet for Healthcare Providers: IncredibleEmployment.be  This test is not yet approved or cleared by the [place] FDA and has been authorized for detection and/or diagnosis of SARS-CoV-2 by FDA under an Emergency Use Authorization (EUA). This EUA will remain in effect (meaning this test can be used)  for the duration of the COVID-19 declaration under Section 564(b)(1) of the Act, 21 U.S.C. section 360bbb-3(b)(1), unless the authorization is terminated or revoked.  Performed at [facility], [address] [place], [place] [address]   MRSA Next Gen by PCR, Nasal     Status: None   Collection Time: 03/13/21  9:07 AM   Specimen: Nasal Mucosa; Nasal Swab  Result Value Ref Range Status   MRSA by PCR Next Gen NOT DETECTED NOT DETECTED Final    Comment: (NOTE) The GeneXpert MRSA Assay (FDA approved for NASAL specimens only), is one component of a comprehensive MRSA colonization surveillance program. It is not intended to diagnose MRSA infection nor to guide or monitor treatment for MRSA infections. Test performance is not FDA approved in patients less than 44 years old. Performed at [facility], [address] [place], [place] [address]          Radiology Studies: [place] UE VEIN MAPPING LEFT (PRE-OP AVF)  Result Date: 03/13/2021 CLINICAL DATA:  End-stage renal disease. Please perform bilateral upper extremity vein mapping for dialysis access planning purposes. EXAM: [place] EXTREM UP VEIN MAPPING COMPARISON:  None. FINDINGS: RIGHT ARTERIES Wrist Radial Artery:  Size 2 mm Waveform Triphasic Wrist Ulnar Artery: Size 2 mm  Waveform Triphasic Prox. Forearm Radial Artery: Size 2 mm  Waveform Triphasic Upper Arm Brachial Artery: Size 20mm  Waveform Triphasic RIGHT VEINS Forearm Cephalic Vein: Prox - too small (sub 2 mm); Distal - too small (sub 2 mm) Prox - too small (sub 2 mm); Distal - too small (sub 2 mm) Upper Arm Basilic Vein: Prox 4 mm Distal 2 mm Depth 6 mm Upper Arm Brachial Vein: Prox - duplicated - 3 & 4 mm; Distal too small Depth 19 and 15 mm respectively ADDITIONAL RIGHT VEINS Axillary Vein: 8 mm Subclavian Vein: Patient: Yes Respiratory Phasicity: Present Internal Jugular Vein: Patent: Yes    Respiratory Phasicity: Present Branches > 2 mm: None  _________________________________________________________ LEFT ARTERIES Wrist Radial Artery: Size 2 mm  Waveform Triphasic Wrist Ulnar Artery: Size 2 mm  Waveform Triphasic Prox. Forearm Radial Artery: Size 2 mm  Waveform Triphasic Upper Arm Brachial Artery: Size 1mm  Waveform Triphasic LEFT VEINS Forearm Cephalic Vein: Prox - too small (sub 2 mm); Distal - too small (sub 2 mm) Upper Arm Cephalic Vein: Prox - too small (sub 2 mm); Distal - too small (sub 2 mm) Upper Arm Basilic Vein: Prox 8 mm Distal 5 mm; Depth 19 mm Upper Arm Brachial Vein: Prox - duplicated - 4 & 2 mm; Distal 2 & 2; depth 21 and 23 mm respectively ADDITIONAL LEFT VEINS Axillary Vein:  10mm Subclavian Vein: Patient: Yes Respiratory Phasicity: Present Internal Jugular Vein: Patent: Yes    Respiratory Phasicity: Present Branches > 2 mm: None _________________________________________________________ No evidence of DVT or SVT within either upper extremity. IMPRESSION: Bilateral upper extremity venous mapping as above. Electronically Signed   By: Sandi Mariscal M.D.   On: 03/13/2021 14:27   [place] UE VEIN MAPPING RIGHT (PRE-OP AVF)  Result Date: 03/13/2021 CLINICAL DATA:  End-stage renal disease. Please perform bilateral upper extremity vein mapping for dialysis access planning purposes. EXAM: [place] EXTREM UP VEIN MAPPING COMPARISON:  None. FINDINGS: RIGHT ARTERIES Wrist Radial Artery: Size 2 mm Waveform Triphasic Wrist Ulnar Artery: Size 2 mm  Waveform Triphasic Prox. Forearm Radial Artery: Size 2 mm  Waveform Triphasic Upper Arm Brachial Artery: Size 76mm  Waveform Triphasic RIGHT VEINS Forearm Cephalic Vein: Prox - too small (sub 2 mm); Distal - too small (sub 2 mm) Prox - too small (sub 2 mm); Distal - too small (sub 2 mm) Upper Arm Basilic Vein: Prox 4 mm Distal 2 mm Depth 6 mm Upper Arm Brachial Vein: Prox - duplicated - 3 & 4 mm; Distal too small Depth 19 and 15 mm respectively ADDITIONAL RIGHT VEINS Axillary Vein: 8 mm Subclavian Vein: Patient: Yes  Respiratory Phasicity: Present Internal Jugular Vein: Patent: Yes    Respiratory Phasicity: Present Branches > 2 mm: None _________________________________________________________ LEFT ARTERIES Wrist Radial Artery: Size 2 mm  Waveform Triphasic Wrist Ulnar Artery: Size 2 mm  Waveform Triphasic Prox. Forearm Radial Artery: Size 2 mm  Waveform Triphasic Upper Arm Brachial Artery: Size 8mm  Waveform Triphasic LEFT VEINS Forearm Cephalic Vein: Prox - too small (sub 2 mm); Distal - too small (sub 2 mm) Upper Arm Cephalic Vein: Prox - too small (sub 2 mm); Distal - too small (sub 2 mm) Upper Arm Basilic Vein: Prox 8 mm Distal 5 mm; Depth 19 mm Upper Arm Brachial Vein: Prox - duplicated - 4 & 2 mm; Distal 2 & 2; depth 21 and 23 mm respectively ADDITIONAL LEFT VEINS Axillary Vein:  75mm Subclavian Vein: Patient: Yes Respiratory Phasicity: Present Internal Jugular Vein: Patent: Yes    Respiratory Phasicity: Present Branches > 2 mm: None _________________________________________________________ No evidence of DVT or SVT within either upper extremity. IMPRESSION: Bilateral upper extremity venous mapping as above. Electronically Signed   By: Sandi Mariscal M.D.   On: 03/13/2021 14:27        Scheduled Meds:  carvedilol  12.5 mg Oral BID WC   Chlorhexidine Gluconate Cloth  6 each Topical Daily   heparin  5,000 Units Subcutaneous Q8H   insulin aspart  0-5 Units Subcutaneous QHS   insulin aspart  0-6 Units Subcutaneous TID WC   lidocaine  1 patch Transdermal Q24H   polyethylene glycol  17 g Oral Daily   senna-docusate  1 tablet Oral QHS   traZODone  50 mg Oral QPM   venlafaxine  37.5 mg Oral BID WC   Continuous Infusions:    Assessment & Plan:   Active Problems:   Hypertension   Type 2 diabetes mellitus (HCC)   Chronic kidney disease (CKD), stage IV (severe) (HCC)   Pulmonary hypertension, unspecified ([place])   ESRD on dialysis ([place])   Frequent falls   Homelessness   Financial difficulty   # Increased  fall and weakness- Likely secondary to postdialysis treatment and hypotension B12, B1, vitamin D and procalcitonin was ordered for work-up Echo nml EF.  Fall precautions 11/1 PT recommending SNF Also needs to change HD to [facility] to nephrology, to change Coreg to every afternoon and added midodrine since still orthostatic and symptomatic         # End-stage renal disease on hemodialysis HD today Plan to find HD center at [place] Left upper extremity dialysis graft creation on Thursday with Dr. Lucky Cowboy     #Constipation Continue current bowel regimen Will add lactulose since patient is not too thrilled about enema.  If she fails with lactulose then have to resort to [place] enema    # Hypertension Orthostatic hypotension Nephrology change carvedilol to 12.5 mg in evening.  Added midodrine 3 times daily Continue to wear SCD 20 to 30 mmHg    # Depression/anxiety-continue venlafaxine        # Insomnia-trazodone 50 mg nightly    # Headaches-topiramate 50 mg p.o. twice daily as needed for headaches   # History of diabetes type 2-presumed improved with end-stage renal disease BG stable Continue RISS    DVT prophylaxis: Heparin Code Status: Full Family Communication: None at bedside Disposition Plan: SNF Status is: Inpatient  Remains inpatient appropriate because: Unsafe discharge, not medically stable for discharge..  Work-up also pending.  Patient will have left AV fistula creation on Thursday.  Needs SNF placement pending.       LOS: 6 days   Time spent: 35 minutes with more than 50% on [place], MD [facility] Pager [phone]  If 7PM-7AM, please contact night-coverage 03/14/2021, 8:13 AM

## 2021-03-14 NOTE — TOC Progression Note (Signed)
Transition of Care [facility]) - Progression Note    Patient Details  Name: Katie [name] MRN: [id number] Date of Birth: 07/26/1955  [facility] CM/SW Contact  Shelbie Hutching, RN Phone Number: 03/14/2021, 9:11 AM  Clinical Narrative:    Damaris Schooner with Tressa Busman this am at [facility], he is actually out of the office with [place] but will check with Rolla Plate who is covering him today to see about updated on authorization for SNF.     Expected Discharge Plan: [place] Barriers to Discharge: Continued Medical Work up  Expected Discharge Plan and Services Expected Discharge Plan: [place] arrangements for the past 2 months: Single Family Home                                       Social Determinants of Health (SDOH) Interventions    Readmission Risk Interventions Readmission Risk Prevention Plan 03/08/2021  Transportation Screening Complete  Medication Review Press photographer) Complete  HRI or [place] Complete

## 2021-03-14 NOTE — Progress Notes (Addendum)
Order received to change CBG to bid per the patients request. Order was also received for Lactulose 15gm daily

## 2021-03-15 ENCOUNTER — Inpatient Hospital Stay: Payer: Medicaid Other

## 2021-03-15 ENCOUNTER — Other Ambulatory Visit (INDEPENDENT_AMBULATORY_CARE_PROVIDER_SITE_OTHER): Payer: Self-pay | Admitting: Vascular Surgery

## 2021-03-15 DIAGNOSIS — R17 Unspecified jaundice: Secondary | ICD-10-CM | POA: Insufficient documentation

## 2021-03-15 DIAGNOSIS — N186 End stage renal disease: Secondary | ICD-10-CM | POA: Diagnosis not present

## 2021-03-15 LAB — GLUCOSE, CAPILLARY
Glucose-Capillary: 104 mg/dL — ABNORMAL HIGH (ref 70–99)
Glucose-Capillary: 133 mg/dL — ABNORMAL HIGH (ref 70–99)

## 2021-03-15 MED ORDER — SODIUM CHLORIDE 0.9 % IV SOLN: 100.0000 mL | INTRAVENOUS | Status: DC | PRN

## 2021-03-15 MED ORDER — LIDOCAINE-PRILOCAINE 2.5-2.5 % EX CREA: 1.0000 "application " | TOPICAL_CREAM | CUTANEOUS | Status: DC | PRN

## 2021-03-15 MED ORDER — ALTEPLASE 2 MG IJ SOLR: 2.0000 mg | Freq: Once | INTRAMUSCULAR | Status: DC | PRN

## 2021-03-15 MED ORDER — PENTAFLUOROPROP-TETRAFLUOROETH EX AERO: 1.0000 "application " | INHALATION_SPRAY | CUTANEOUS | Status: DC | PRN

## 2021-03-15 MED ORDER — ONDANSETRON HCL 4 MG PO TABS
ORAL_TABLET | ORAL | Status: AC
  Administered 2021-03-15: 4 mg
  Filled 2021-03-15: qty 1

## 2021-03-15 MED ORDER — LIDOCAINE HCL (PF) 1 % IJ SOLN: 5.0000 mL | INTRAMUSCULAR | Status: DC | PRN

## 2021-03-15 MED ORDER — HEPARIN SODIUM (PORCINE) 1000 UNIT/ML DIALYSIS
1000.0000 [IU] | INTRAMUSCULAR | Status: DC | PRN
  Administered 2021-03-15: 1000 [IU] via INTRAVENOUS_CENTRAL

## 2021-03-15 MED ORDER — HEPARIN SODIUM (PORCINE) 1000 UNIT/ML IJ SOLN
INTRAMUSCULAR | Status: AC
  Filled 2021-03-15: qty 1

## 2021-03-15 NOTE — Progress Notes (Signed)
Physical Therapy Treatment Patient Details Name: Katie [name] MRN: [id number] DOB: 07/10/1955 Today's Date: 03/15/2021   History of Present Illness Pt is a 65 y.o. female with medical history significant for hypertension, DM, end-stage renal disease on hemodialysis TuThSa, heart failure preserved ejection fraction, chronic low back pain, presents emergency department for chief concerns of frequent falls and syncope after dialysis. Pt struggles with homelessness and finanacial difficulty. MD assessment includes: increased falls and weakness, depression/anxiety, insominia, and HAs.    PT Comments     Upon arriving to room, pt was A and O x 4. Agreeable to session and cooperative throughout." I just want to get better so I can be independent again." Pt BP in bed prior to OOB activity: 126/55. Upon sitting up EOB 94/52(55). Pt did endorse slight dizziness that resolved with several exercises at EOB. BP elevated to 109/73 prior to standing and ambulating to doorway of room and back 2 x with prolonged rest between. No LOB however pt is very fatigue after 2nd trial.  She returned to long sitting in bed post session with call bell in reach and RN tech in room. Recommend DC to SNF to address deficits while maximizing independence with ADLs.    Recommendations for follow up therapy are one component of a multi-disciplinary discharge planning process, led by the attending physician.  Recommendations may be updated based on patient status, additional functional criteria and insurance authorization.  Follow Up Recommendations  Skilled nursing-short term rehab (<3 hours/day)     Assistance Recommended at Discharge Intermittent Supervision/Assistance  Equipment Recommendations  None recommended by PT       Precautions / Restrictions Precautions Precautions: Fall Restrictions Weight Bearing Restrictions: No     Mobility  Bed Mobility Overal bed mobility: Modified Independent Bed Mobility: Supine to  Sit;Sit to Supine   Sidelying to sit: Supervision;HOB elevated Supine to sit: Supervision;HOB elevated Sit to supine: Supervision   General bed mobility comments: BP at rest in bed: 126/55(76) upon sitting up EOB 94/52 (55). Author had pt perform several exercises EOB prior to standing. pt did endorse slight dizziness that quickly resolved with ther ex.BP prior to standing 109/73 (66)    Transfers Overall transfer level: Needs assistance Equipment used: Rolling walker (2 wheels) Transfers: Sit to/from Stand Sit to Stand: Min guard;Supervision           General transfer comment: CGA on first STS EOB however only supervision on 2nd STS.    Ambulation/Gait Ambulation/Gait assistance: Min guard Gait Distance (Feet): 20 Feet Assistive device: Rolling walker (2 wheels) Gait Pattern/deviations: Step-to pattern;Decreased step length - right;Decreased step length - left Gait velocity: decreased   General Gait Details: Pt ambulated to doorway of room and return 2 x 20 ft. no LOB but pt does endorse fatigue requiring prolonged rest between to recover. Overall she is progressing well but is extremely weak and deconditioned     Balance Overall balance assessment: Needs assistance Sitting-balance support: Bilateral upper extremity supported;Feet supported Sitting balance-Leahy Scale: Good     Standing balance support: Bilateral upper extremity supported;During functional activity Standing balance-Leahy Scale: Fair Standing balance comment: Use of RW for minor stability       Cognition Arousal/Alertness: Awake/alert Behavior During Therapy: WFL for tasks assessed/performed Overall Cognitive Status: Within Functional Limits for tasks assessed      General Comments: Pt is A and O x 4. Had shortened HD chair time today however reports feeling well enough to participate  Pertinent Vitals/Pain Pain Assessment: No/denies pain Pain Score: 0-No pain     PT Goals  (current goals can now be found in the care plan section) Acute Rehab PT Goals Patient Stated Goal: Improved strength and balance and return to independence Progress towards PT goals: Progressing toward goals    Frequency    Min 2X/week      PT Plan Current plan remains appropriate       AM-PAC PT "6 Clicks" Mobility   Outcome Measure  Help needed turning from your back to your side while in a flat bed without using bedrails?: A Little Help needed moving from lying on your back to sitting on the side of a flat bed without using bedrails?: A Little Help needed moving to and from a bed to a chair (including a wheelchair)?: A Little Help needed standing up from a chair using your arms (e.g., wheelchair or bedside chair)?: A Little Help needed to walk in hospital room?: A Little Help needed climbing 3-5 steps with a railing? : A Lot 6 Click Score: 17    End of Session Equipment Utilized During Treatment: Gait belt Activity Tolerance: Patient tolerated treatment well;Patient limited by fatigue Patient left: in bed;with call bell/phone within reach;with bed alarm set Nurse Communication: Mobility status PT Visit Diagnosis: Unsteadiness on feet (R26.81);History of falling (Z91.81);Muscle weakness (generalized) (M62.81);Difficulty in walking, not elsewhere classified (R26.2);Pain     Time: 0092-3300 PT Time Calculation (min) (ACUTE ONLY): 19 min  Charges:  $Gait Training: 8-22 mins                     Julaine Fusi PTA 03/15/21, 5:21 PM

## 2021-03-15 NOTE — Progress Notes (Signed)
 Vein & Vascular Surgery Daily Progress Note  Subjective: Patient without complaint this afternoon.  No acute issues overnight.  Objective: Vitals:   03/15/21 1015 03/15/21 1030 03/15/21 1146 03/15/21 1155  BP: (!) 106/57 (!) 99/59  135/72  Pulse: 69 70  74  Resp: 12 12  18   Temp:   99.2 F (37.3 C) 98.8 F (37.1 C)  TempSrc:   Oral Oral  SpO2: 96% 95%  96%  Weight:   81.4 kg   Height:        Intake/Output Summary (Last 24 hours) at 03/15/2021 1355 Last data filed at 03/15/2021 1114 Gross per 24 hour  Intake --  Output 757 ml  Net -757 ml   Physical Exam: A&Ox3, NAD CV: RRR Pulmonary: CTA Bilaterally Abdomen: Soft, Nontender, Nondistended Vascular:  Left upper extremity: Palpable radial and ulnar pulse.  Hand is warm.   Laboratory: CBC    Component Value Date/Time   WBC 5.6 03/14/2021 1309   HGB 9.8 (L) 03/14/2021 1309   HCT 31.0 (L) 03/14/2021 1309   PLT 257 03/14/2021 1309   BMET    Component Value Date/Time   NA 134 (L) 03/08/2021 0633   K 4.3 03/08/2021 0633   CL 99 03/08/2021 0633   CO2 25 03/08/2021 0633   GLUCOSE 94 03/08/2021 0633   BUN 40 (H) 03/08/2021 0633   CREATININE 4.91 (H) 03/08/2021 0633   CALCIUM 8.2 (L) 03/08/2021 0633   GFRNONAA 9 (L) 03/08/2021 0633   GFRAA  01/19/2021 0833    SPECIMEN HEMOLYZED. HEMOLYSIS MAY AFFECT INTEGRITY OF RESULTS.   Assessment/Planning: The patient is a 65 year old female with known chronic kidney disease which has now progressed to end-stage renal requiring dialysis  1) End-Stage Renal Disease: Patient is currently being maintained via PermCath without issue. Plan on left upper extremity fistula versus graft creation tomorrow with Dr. Lucky Cowboy.  Procedure, risks and benefits were explained to the patient.  All questions were answered.  Patient still wishes to proceed.  Discussed with Dr. Ellis Parents Katie Van PA-C 03/15/2021 1:55 PM

## 2021-03-15 NOTE — Progress Notes (Signed)
PROGRESS NOTE    Katie [name]  IDP:[id number] DOB: June 12, 1955 DOA: 03/07/2021 PCP: Patient, [name] (Inactive)    Brief Narrative:  Katie [name] is a 65 y.o. female with medical history significant for hypertension, end-stage renal disease on hemodialysis TuThSa, heart failure preserved ejection fraction, chronic low back pain, presents emergency department for chief concerns of frequent falls and syncope after dialysis.. Found to be hypotensive.  She does have orthostatic hypotension.  Her BP meds were changed and started on midodrine today. PT recommended SNF. 11/1 plan for AVF creation by vascular on Thursday.    Assessment & Plan:   Active Problems:   Hypertension   Type 2 diabetes mellitus (HCC)   Chronic kidney disease (CKD), stage IV (severe) (HCC)   Pulmonary hypertension, unspecified ([place])   ESRD on dialysis ([place])   Frequent falls   Homelessness   Financial difficulty  Increased fall and weakness Likely secondary to postdialysis treatment and hypotension B12, B1, vitamin D and procalcitonin was ordered for work-up Echo nml EF.  Fall precautions 11/1 PT recommending SNF Also needs to change HD to [facility] dose decreased Midodrine added Continue therapy evaluations and fall precautions  End-stage renal disease on hemodialysis Nephrology following for inpatient HD needs Plan to find HD center at [place] Left upper extremity dialysis graft creation on Thursday with Dr. Lucky Cowboy N.p.o. after midnight   Constipation Continue current bowel regimen Monitor for BM Lactulose added, if fails will need enema  Hypertension Orthostatic hypotension Coreg dose decreased Midodrine 5 3 times daily SCDs Avoid hypotension  Depression/anxiety continue venlafaxine   Insomnia trazodone 50 mg nightly    Headaches topiramate 50 mg p.o. twice daily as needed for headaches   History of diabetes type 2 presumed improved with end-stage renal disease BG  stable Continue RISS   DVT prophylaxis: SQ heparin Code Status: Full Family Communication: None today Disposition Plan: Status is: Inpatient  Remains inpatient appropriate because: Plan for left upper extremity AV fistula creation Thursday 11/3       Level of care: Med-Surg  Consultants:  Nephrology Vascular surgery  Procedures:  AV fistula creation planned 11/3  Antimicrobials: None   Subjective: Patient seen and examined after EGD.  Resting comfortably in bed.  No visible distress.  No pain complaints.  Objective: Vitals:   03/15/21 1015 03/15/21 1030 03/15/21 1146 03/15/21 1155  BP: (!) 106/57 (!) 99/59  135/72  Pulse: 69 70  74  Resp: 12 12  18   Temp:   99.2 F (37.3 C) 98.8 F (37.1 C)  TempSrc:   Oral Oral  SpO2: 96% 95%  96%  Weight:   81.4 kg   Height:        Intake/Output Summary (Last 24 hours) at 03/15/2021 1341 Last data filed at 03/15/2021 1114 Gross per 24 hour  Intake --  Output 757 ml  Net -757 ml   Filed Weights   03/14/21 1309 03/15/21 0905 03/15/21 1146  Weight: 84.7 kg 81.5 kg 81.4 kg    Examination:  General exam: Appears calm and comfortable  Respiratory system: Lungs clear.  Normal work of breathing.  Room air Cardiovascular system: S1-S2, RRR, no murmurs, no pedal edema Gastrointestinal system: Soft, NT/ND, normal bowel sounds Central nervous system: Alert and oriented. No focal neurological deficits. Extremities: Diffusely decreased power bilaterally Skin: No rashes, lesions or ulcers Psychiatry: Judgement and insight appear normal. Mood & affect appropriate.     Data Reviewed: I have personally reviewed following labs and imaging  studies  CBC: Recent Labs  Lab 03/14/21 1309  WBC 5.6  HGB 9.8*  HCT 31.0*  MCV 98.1  PLT 106   Basic Metabolic Panel: No results for input(s): NA, K, CL, CO2, GLUCOSE, BUN, CREATININE, CALCIUM, MG, PHOS in the last 168 hours. GFR: Estimated Creatinine Clearance: 12.8 mL/min (A)  (by C-G formula based on SCr of 4.91 mg/dL (H)). Liver Function Tests: No results for input(s): AST, ALT, ALKPHOS, BILITOT, PROT, ALBUMIN in the last 168 hours. No results for input(s): LIPASE, AMYLASE in the last 168 hours. No results for input(s): AMMONIA in the last 168 hours. Coagulation Profile: No results for input(s): INR, PROTIME in the last 168 hours. Cardiac Enzymes: No results for input(s): CKTOTAL, CKMB, CKMBINDEX, TROPONINI in the last 168 hours. BNP (last 3 results) No results for input(s): PROBNP in the last 8760 hours. HbA1C: No results for input(s): HGBA1C in the last 72 hours. CBG: Recent Labs  Lab 03/13/21 2045 03/14/21 0752 03/14/21 1122 03/14/21 2051 03/15/21 0759  GLUCAP 90 85 124* 90 104*   Lipid Profile: No results for input(s): CHOL, HDL, LDLCALC, TRIG, CHOLHDL, LDLDIRECT in the last 72 hours. Thyroid Function Tests: No results for input(s): TSH, T4TOTAL, FREET4, T3FREE, THYROIDAB in the last 72 hours. Anemia Panel: No results for input(s): VITAMINB12, FOLATE, FERRITIN, TIBC, IRON, RETICCTPCT in the last 72 hours. Sepsis Labs: No results for input(s): PROCALCITON, LATICACIDVEN in the last 168 hours.  Recent Results (from the past 240 hour(s))  Resp Panel by RT-PCR (Flu A&B, Covid) Nasopharyngeal Swab     Status: None   Collection Time: 03/07/21 12:55 PM   Specimen: Nasopharyngeal Swab; Nasopharyngeal(NP) swabs in vial transport medium  Result Value Ref Range Status   SARS Coronavirus 2 by RT PCR NEGATIVE NEGATIVE Final    Comment: (NOTE) SARS-CoV-2 target nucleic acids are NOT DETECTED.  The SARS-CoV-2 RNA is generally detectable in upper respiratory specimens during the acute phase of infection. The lowest concentration of SARS-CoV-2 viral copies this assay can detect is 138 copies/mL. A negative result does not preclude SARS-Cov-2 infection and should not be used as the sole basis for treatment or other patient management decisions. A negative  result may occur with  improper specimen collection/handling, submission of specimen other than nasopharyngeal swab, presence of viral mutation(s) within the areas targeted by this assay, and inadequate number of viral copies(<138 copies/mL). A negative result must be combined with clinical observations, patient history, and epidemiological information. The expected result is Negative.  Fact Sheet for Patients:  EntrepreneurPulse.com.au  Fact Sheet for Healthcare Providers:  IncredibleEmployment.be  This test is no t yet approved or cleared by the [place] FDA and  has been authorized for detection and/or diagnosis of SARS-CoV-2 by FDA under an Emergency Use Authorization (EUA). This EUA will remain  in effect (meaning this test can be used) for the duration of the COVID-19 declaration under Section 564(b)(1) of the Act, 21 U.S.C.section 360bbb-3(b)(1), unless the authorization is terminated  or revoked sooner.       Influenza A by PCR NEGATIVE NEGATIVE Final   Influenza B by PCR NEGATIVE NEGATIVE Final    Comment: (NOTE) The Xpert Xpress SARS-CoV-2/FLU/RSV plus assay is intended as an aid in the diagnosis of influenza from Nasopharyngeal swab specimens and should not be used as a sole basis for treatment. Nasal washings and aspirates are unacceptable for Xpert Xpress SARS-CoV-2/FLU/RSV testing.  Fact Sheet for Patients: EntrepreneurPulse.com.au  Fact Sheet for Healthcare Providers: IncredibleEmployment.be  This test is  not yet approved or cleared by the [place] and has been authorized for detection and/or diagnosis of SARS-CoV-2 by FDA under an Emergency Use Authorization (EUA). This EUA will remain in effect (meaning this test can be used) for the duration of the COVID-19 declaration under Section 564(b)(1) of the Act, 21 U.S.C. section 360bbb-3(b)(1), unless the authorization is  terminated or revoked.  Performed at [facility], [address] [place], [place] [address]   MRSA Next Gen by PCR, Nasal     Status: None   Collection Time: 03/13/21  9:07 AM   Specimen: Nasal Mucosa; Nasal Swab  Result Value Ref Range Status   MRSA by PCR Next Gen NOT DETECTED NOT DETECTED Final    Comment: (NOTE) The GeneXpert MRSA Assay (FDA approved for NASAL specimens only), is one component of a comprehensive MRSA colonization surveillance program. It is not intended to diagnose MRSA infection nor to guide or monitor treatment for MRSA infections. Test performance is not FDA approved in patients less than 1 years old. Performed at [facility], [address], [place], [place] [address]          Radiology Studies: [place] UE VEIN MAPPING LEFT (PRE-OP AVF)  Result Date: 03/13/2021 CLINICAL DATA:  End-stage renal disease. Please perform bilateral upper extremity vein mapping for dialysis access planning purposes. EXAM: [place] EXTREM UP VEIN MAPPING COMPARISON:  None. FINDINGS: RIGHT ARTERIES Wrist Radial Artery: Size 2 mm Waveform Triphasic Wrist Ulnar Artery: Size 2 mm  Waveform Triphasic Prox. Forearm Radial Artery: Size 2 mm  Waveform Triphasic Upper Arm Brachial Artery: Size 58mm  Waveform Triphasic RIGHT VEINS Forearm Cephalic Vein: Prox - too small (sub 2 mm); Distal - too small (sub 2 mm) Prox - too small (sub 2 mm); Distal - too small (sub 2 mm) Upper Arm Basilic Vein: Prox 4 mm Distal 2 mm Depth 6 mm Upper Arm Brachial Vein: Prox - duplicated - 3 & 4 mm; Distal too small Depth 19 and 15 mm respectively ADDITIONAL RIGHT VEINS Axillary Vein: 8 mm Subclavian Vein: Patient: Yes Respiratory Phasicity: Present Internal Jugular Vein: Patent: Yes    Respiratory Phasicity: Present Branches > 2 mm: None _________________________________________________________ LEFT ARTERIES Wrist Radial Artery: Size 2 mm  Waveform Triphasic Wrist Ulnar Artery: Size 2 mm  Waveform  Triphasic Prox. Forearm Radial Artery: Size 2 mm  Waveform Triphasic Upper Arm Brachial Artery: Size 55mm  Waveform Triphasic LEFT VEINS Forearm Cephalic Vein: Prox - too small (sub 2 mm); Distal - too small (sub 2 mm) Upper Arm Cephalic Vein: Prox - too small (sub 2 mm); Distal - too small (sub 2 mm) Upper Arm Basilic Vein: Prox 8 mm Distal 5 mm; Depth 19 mm Upper Arm Brachial Vein: Prox - duplicated - 4 & 2 mm; Distal 2 & 2; depth 21 and 23 mm respectively ADDITIONAL LEFT VEINS Axillary Vein:  58mm Subclavian Vein: Patient: Yes Respiratory Phasicity: Present Internal Jugular Vein: Patent: Yes    Respiratory Phasicity: Present Branches > 2 mm: None _________________________________________________________ No evidence of DVT or SVT within either upper extremity. IMPRESSION: Bilateral upper extremity venous mapping as above. Electronically Signed   By: Sandi Mariscal M.D.   On: 03/13/2021 14:27   [place] UE VEIN MAPPING RIGHT (PRE-OP AVF)  Result Date: 03/13/2021 CLINICAL DATA:  End-stage renal disease. Please perform bilateral upper extremity vein mapping for dialysis access planning purposes. EXAM: [place] EXTREM UP VEIN MAPPING COMPARISON:  None. FINDINGS: RIGHT ARTERIES Wrist Radial Artery: Size  2 mm Waveform Triphasic Wrist Ulnar Artery: Size 2 mm  Waveform Triphasic Prox. Forearm Radial Artery: Size 2 mm  Waveform Triphasic Upper Arm Brachial Artery: Size 72mm  Waveform Triphasic RIGHT VEINS Forearm Cephalic Vein: Prox - too small (sub 2 mm); Distal - too small (sub 2 mm) Prox - too small (sub 2 mm); Distal - too small (sub 2 mm) Upper Arm Basilic Vein: Prox 4 mm Distal 2 mm Depth 6 mm Upper Arm Brachial Vein: Prox - duplicated - 3 & 4 mm; Distal too small Depth 19 and 15 mm respectively ADDITIONAL RIGHT VEINS Axillary Vein: 8 mm Subclavian Vein: Patient: Yes Respiratory Phasicity: Present Internal Jugular Vein: Patent: Yes    Respiratory Phasicity: Present Branches > 2 mm: None  _________________________________________________________ LEFT ARTERIES Wrist Radial Artery: Size 2 mm  Waveform Triphasic Wrist Ulnar Artery: Size 2 mm  Waveform Triphasic Prox. Forearm Radial Artery: Size 2 mm  Waveform Triphasic Upper Arm Brachial Artery: Size 53mm  Waveform Triphasic LEFT VEINS Forearm Cephalic Vein: Prox - too small (sub 2 mm); Distal - too small (sub 2 mm) Upper Arm Cephalic Vein: Prox - too small (sub 2 mm); Distal - too small (sub 2 mm) Upper Arm Basilic Vein: Prox 8 mm Distal 5 mm; Depth 19 mm Upper Arm Brachial Vein: Prox - duplicated - 4 & 2 mm; Distal 2 & 2; depth 21 and 23 mm respectively ADDITIONAL LEFT VEINS Axillary Vein:  91mm Subclavian Vein: Patient: Yes Respiratory Phasicity: Present Internal Jugular Vein: Patent: Yes    Respiratory Phasicity: Present Branches > 2 mm: None _________________________________________________________ No evidence of DVT or SVT within either upper extremity. IMPRESSION: Bilateral upper extremity venous mapping as above. Electronically Signed   By: Sandi Mariscal M.D.   On: 03/13/2021 14:27        Scheduled Meds:  carvedilol  12.5 mg Oral QPM   Chlorhexidine Gluconate Cloth  6 each Topical Daily   heparin  5,000 Units Subcutaneous Q8H   heparin sodium (porcine)       insulin aspart  0-5 Units Subcutaneous QHS   insulin aspart  0-6 Units Subcutaneous QAC breakfast   lactulose  15 g Oral Daily   lidocaine  1 patch Transdermal Q24H   midodrine  5 mg Oral TID WC   polyethylene glycol  17 g Oral Daily   senna-docusate  1 tablet Oral QHS   traZODone  50 mg Oral QHS   venlafaxine  37.5 mg Oral BID WC   Continuous Infusions:   LOS: 7 days    Time spent: 25 minutes    Sidney Ace, MD [facility]   If 7PM-7AM, please contact night-coverage  03/15/2021, 1:41 PM

## 2021-03-15 NOTE — Progress Notes (Signed)
OT Cancellation Note  Patient Details Name: Katie [name] MRN: [id number] DOB: Dec 09, 1955   Cancelled Treatment:    Reason Eval/Treat Not Completed: Patient at procedure or test/ unavailable. Chart reviewed, upon arrival pt noted to be off the floor for HD. Will continue to follow POC at later date/time as pt available.    Dessie Coma, M.S. OTR/L  03/15/21, 9:40 AM  ascom [phone]

## 2021-03-15 NOTE — Progress Notes (Signed)
bedscale

## 2021-03-15 NOTE — Progress Notes (Signed)
[facility]  ROUNDING NOTE   Subjective:   Katie [name] is a 65 year old female with past medical history including heart failure with preserved EF, chronic back pain chronic hypertension, and end-stage renal disease on hemodialysis. She has been admitted for ESRD (end stage renal disease) ([place]) [N18.6] Frequent falls [R29.6] Recurrent falls [R29.6] Syncope, unspecified syncope type [R55]   Patient seen and evaluated during hemodialysis   HEMODIALYSIS FLOWSHEET:  Blood Flow Rate (mL/min): 400 mL/min Arterial Pressure (mmHg): -200 mmHg Venous Pressure (mmHg): 320 mmHg Transmembrane Pressure (mmHg): 50 mmHg Ultrafiltration Rate (mL/min): 600 mL/min Dialysate Flow Rate (mL/min): 500 ml/min Conductivity: Machine : 13.7 Conductivity: Machine : 13.7 Dialysis Fluid Bolus: Normal Saline Bolus Amount (mL): 250 mL  Currently tolerating treatment well.  Complains of nausea towards end of dialysis and afterwards   Objective:  Vital signs in last 24 hours:  Temp:  [98.3 F (36.8 C)-99.3 F (37.4 C)] 99.3 F (37.4 C) (11/02 0905) Pulse Rate:  [67-78] 69 (11/02 0945) Resp:  [11-18] 17 (11/02 0945) BP: (102-166)/(48-89) 139/62 (11/02 0945) SpO2:  [94 %-96 %] 95 % (11/02 0945) Weight:  [81.5 kg-84.7 kg] 81.5 kg (11/02 0905)  Weight change:  Filed Weights   03/11/21 1625 03/14/21 1309 03/15/21 0905  Weight: 84 kg 84.7 kg 81.5 kg    Intake/Output: I/O last 3 completed shifts: In: -  Out: 1013 [Urine:300; Other:713]   Intake/Output this shift:  No intake/output data recorded.  Physical Exam: General: Resting in bed, in no acute distress  Head: Moist oral mucosal membranes  Lungs:  Respirations even,unlabored   Heart: S1S2, no rubs or gallops  Abdomen:  Soft, nontender,non distended  Extremities:  No peripheral edema.  Neurologic: Awake,alert,oriented  Skin: No acute  lesions or rashes  Access: Rt chest Permcath    Basic Metabolic Panel: No results for  input(s): NA, K, CL, CO2, GLUCOSE, BUN, CREATININE, CALCIUM, MG, PHOS in the last 168 hours.   Liver Function Tests: No results for input(s): AST, ALT, ALKPHOS, BILITOT, PROT, ALBUMIN in the last 168 hours.  No results for input(s): LIPASE, AMYLASE in the last 168 hours. No results for input(s): AMMONIA in the last 168 hours.  CBC: Recent Labs  Lab 03/14/21 1309  WBC 5.6  HGB 9.8*  HCT 31.0*  MCV 98.1  PLT 257     Cardiac Enzymes: No results for input(s): CKTOTAL, CKMB, CKMBINDEX, TROPONINI in the last 168 hours.  BNP: Invalid input(s): POCBNP  CBG: Recent Labs  Lab 03/13/21 2045 03/14/21 0752 03/14/21 1122 03/14/21 2051 03/15/21 0759  GLUCAP 90 85 124* 90 104*     Microbiology: Results for orders placed or performed during the hospital encounter of 03/07/21  Resp Panel by RT-PCR (Flu A&B, Covid) Nasopharyngeal Swab     Status: None   Collection Time: 03/07/21 12:55 PM   Specimen: Nasopharyngeal Swab; Nasopharyngeal(NP) swabs in vial transport medium  Result Value Ref Range Status   SARS Coronavirus 2 by RT PCR NEGATIVE NEGATIVE Final    Comment: (NOTE) SARS-CoV-2 target nucleic acids are NOT DETECTED.  The SARS-CoV-2 RNA is generally detectable in upper respiratory specimens during the acute phase of infection. The lowest concentration of SARS-CoV-2 viral copies this assay can detect is 138 copies/mL. A negative result does not preclude SARS-Cov-2 infection and should not be used as the sole basis for treatment or other patient management decisions. A negative result may occur with  improper specimen collection/handling, submission of specimen other than nasopharyngeal swab, presence of viral  mutation(s) within the areas targeted by this assay, and inadequate number of viral copies(<138 copies/mL). A negative result must be combined with clinical observations, patient history, and epidemiological information. The expected result is Negative.  Fact Sheet  for Patients:  EntrepreneurPulse.com.au  Fact Sheet for Healthcare Providers:  IncredibleEmployment.be  This test is no t yet approved or cleared by the [place] FDA and  has been authorized for detection and/or diagnosis of SARS-CoV-2 by FDA under an Emergency Use Authorization (EUA). This EUA will remain  in effect (meaning this test can be used) for the duration of the COVID-19 declaration under Section 564(b)(1) of the Act, 21 U.S.C.section 360bbb-3(b)(1), unless the authorization is terminated  or revoked sooner.       Influenza A by PCR NEGATIVE NEGATIVE Final   Influenza B by PCR NEGATIVE NEGATIVE Final    Comment: (NOTE) The Xpert Xpress SARS-CoV-2/FLU/RSV plus assay is intended as an aid in the diagnosis of influenza from Nasopharyngeal swab specimens and should not be used as a sole basis for treatment. Nasal washings and aspirates are unacceptable for Xpert Xpress SARS-CoV-2/FLU/RSV testing.  Fact Sheet for Patients: EntrepreneurPulse.com.au  Fact Sheet for Healthcare Providers: IncredibleEmployment.be  This test is not yet approved or cleared by the [place] FDA and has been authorized for detection and/or diagnosis of SARS-CoV-2 by FDA under an Emergency Use Authorization (EUA). This EUA will remain in effect (meaning this test can be used) for the duration of the COVID-19 declaration under Section 564(b)(1) of the Act, 21 U.S.C. section 360bbb-3(b)(1), unless the authorization is terminated or revoked.  Performed at [facility], [address] [place], [place] [address]   MRSA Next Gen by PCR, Nasal     Status: None   Collection Time: 03/13/21  9:07 AM   Specimen: Nasal Mucosa; Nasal Swab  Result Value Ref Range Status   MRSA by PCR Next Gen NOT DETECTED NOT DETECTED Final    Comment: (NOTE) The GeneXpert MRSA Assay (FDA approved for NASAL specimens only), is one  component of a comprehensive MRSA colonization surveillance program. It is not intended to diagnose MRSA infection nor to guide or monitor treatment for MRSA infections. Test performance is not FDA approved in patients less than 58 years old. Performed at [facility], [address] [place], [place] [address]     Coagulation Studies: No results for input(s): LABPROT, INR in the last 72 hours.  Urinalysis: No results for input(s): COLORURINE, LABSPEC, PHURINE, GLUCOSEU, HGBUR, BILIRUBINUR, KETONESUR, PROTEINUR, UROBILINOGEN, NITRITE, LEUKOCYTESUR in the last 72 hours.  Invalid input(s): APPERANCEUR    Imaging: [place] UE VEIN MAPPING LEFT (PRE-OP AVF)  Result Date: 03/13/2021 CLINICAL DATA:  End-stage renal disease. Please perform bilateral upper extremity vein mapping for dialysis access planning purposes. EXAM: [place] EXTREM UP VEIN MAPPING COMPARISON:  None. FINDINGS: RIGHT ARTERIES Wrist Radial Artery: Size 2 mm Waveform Triphasic Wrist Ulnar Artery: Size 2 mm  Waveform Triphasic Prox. Forearm Radial Artery: Size 2 mm  Waveform Triphasic Upper Arm Brachial Artery: Size 8mm  Waveform Triphasic RIGHT VEINS Forearm Cephalic Vein: Prox - too small (sub 2 mm); Distal - too small (sub 2 mm) Prox - too small (sub 2 mm); Distal - too small (sub 2 mm) Upper Arm Basilic Vein: Prox 4 mm Distal 2 mm Depth 6 mm Upper Arm Brachial Vein: Prox - duplicated - 3 & 4 mm; Distal too small Depth 19 and 15 mm respectively ADDITIONAL RIGHT VEINS Axillary Vein: 8 mm Subclavian Vein:  Patient: Yes Respiratory Phasicity: Present Internal Jugular Vein: Patent: Yes    Respiratory Phasicity: Present Branches > 2 mm: None _________________________________________________________ LEFT ARTERIES Wrist Radial Artery: Size 2 mm  Waveform Triphasic Wrist Ulnar Artery: Size 2 mm  Waveform Triphasic Prox. Forearm Radial Artery: Size 2 mm  Waveform Triphasic Upper Arm Brachial Artery: Size 7mm  Waveform Triphasic LEFT VEINS  Forearm Cephalic Vein: Prox - too small (sub 2 mm); Distal - too small (sub 2 mm) Upper Arm Cephalic Vein: Prox - too small (sub 2 mm); Distal - too small (sub 2 mm) Upper Arm Basilic Vein: Prox 8 mm Distal 5 mm; Depth 19 mm Upper Arm Brachial Vein: Prox - duplicated - 4 & 2 mm; Distal 2 & 2; depth 21 and 23 mm respectively ADDITIONAL LEFT VEINS Axillary Vein:  76mm Subclavian Vein: Patient: Yes Respiratory Phasicity: Present Internal Jugular Vein: Patent: Yes    Respiratory Phasicity: Present Branches > 2 mm: None _________________________________________________________ No evidence of DVT or SVT within either upper extremity. IMPRESSION: Bilateral upper extremity venous mapping as above. Electronically Signed   By: Sandi Mariscal M.D.   On: 03/13/2021 14:27   [place] UE VEIN MAPPING RIGHT (PRE-OP AVF)  Result Date: 03/13/2021 CLINICAL DATA:  End-stage renal disease. Please perform bilateral upper extremity vein mapping for dialysis access planning purposes. EXAM: [place] EXTREM UP VEIN MAPPING COMPARISON:  None. FINDINGS: RIGHT ARTERIES Wrist Radial Artery: Size 2 mm Waveform Triphasic Wrist Ulnar Artery: Size 2 mm  Waveform Triphasic Prox. Forearm Radial Artery: Size 2 mm  Waveform Triphasic Upper Arm Brachial Artery: Size 27mm  Waveform Triphasic RIGHT VEINS Forearm Cephalic Vein: Prox - too small (sub 2 mm); Distal - too small (sub 2 mm) Prox - too small (sub 2 mm); Distal - too small (sub 2 mm) Upper Arm Basilic Vein: Prox 4 mm Distal 2 mm Depth 6 mm Upper Arm Brachial Vein: Prox - duplicated - 3 & 4 mm; Distal too small Depth 19 and 15 mm respectively ADDITIONAL RIGHT VEINS Axillary Vein: 8 mm Subclavian Vein: Patient: Yes Respiratory Phasicity: Present Internal Jugular Vein: Patent: Yes    Respiratory Phasicity: Present Branches > 2 mm: None _________________________________________________________ LEFT ARTERIES Wrist Radial Artery: Size 2 mm  Waveform Triphasic Wrist Ulnar Artery: Size 2 mm  Waveform Triphasic Prox.  Forearm Radial Artery: Size 2 mm  Waveform Triphasic Upper Arm Brachial Artery: Size 63mm  Waveform Triphasic LEFT VEINS Forearm Cephalic Vein: Prox - too small (sub 2 mm); Distal - too small (sub 2 mm) Upper Arm Cephalic Vein: Prox - too small (sub 2 mm); Distal - too small (sub 2 mm) Upper Arm Basilic Vein: Prox 8 mm Distal 5 mm; Depth 19 mm Upper Arm Brachial Vein: Prox - duplicated - 4 & 2 mm; Distal 2 & 2; depth 21 and 23 mm respectively ADDITIONAL LEFT VEINS Axillary Vein:  64mm Subclavian Vein: Patient: Yes Respiratory Phasicity: Present Internal Jugular Vein: Patent: Yes    Respiratory Phasicity: Present Branches > 2 mm: None _________________________________________________________ No evidence of DVT or SVT within either upper extremity. IMPRESSION: Bilateral upper extremity venous mapping as above. Electronically Signed   By: Sandi Mariscal M.D.   On: 03/13/2021 14:27     Medications:     heparin sodium (porcine)       carvedilol  12.5 mg Oral QPM   Chlorhexidine Gluconate Cloth  6 each Topical Daily   heparin  5,000 Units Subcutaneous Q8H   insulin aspart  0-5 Units Subcutaneous QHS  insulin aspart  0-6 Units Subcutaneous QAC breakfast   lactulose  15 g Oral Daily   lidocaine  1 patch Transdermal Q24H   midodrine  5 mg Oral TID WC   polyethylene glycol  17 g Oral Daily   senna-docusate  1 tablet Oral QHS   traZODone  50 mg Oral QHS   venlafaxine  37.5 mg Oral BID WC   guaiFENesin, ondansetron (ZOFRAN) IV, oxyCODONE, topiramate  Assessment/ Plan:  Katie [name] is a 65 y.o.  female with past medical history including heart failure with preserved EF, chronic back pain chronic hypertension, and end-stage renal disease on hemodialysis. She has been admitted for ESRD (end stage renal disease) ([place]) [N18.6] Frequent falls [R29.6] Recurrent falls [R29.6] Syncope, unspecified syncope type [R55]  [facility]/TTS/Rt Permcath/84 kg  #End-stage renal disease on hemodialysis.    Currently receiving dialysis, tolerating well.  Patient began clotting during treatment yesterday and received as needed heparin.  Dialysis lines were primed with heparin at initiation of dialysis today.  Patient will also receive heparin 1000 units hourly during treatment to prevent this concern.  Vascular surgery has planned fistula placement tomorrow.  Next treatment scheduled for Friday.   #Anemia of chronic kidney disease Lab Results  Component Value Date   HGB 9.8 (L) 03/14/2021  Hgb at target   #Secondary Hyperparathyroidism: Lab Results  Component Value Date   PTH 70 (H) 05/12/2020   CALCIUM 8.2 (L) 03/08/2021   PHOS 2.7 06/27/2020  Will continue monitoring bone mineral metabolism parameters  #Chronic diastolic heart failure: ECHO from 03/07/21 shows EF of 60-65%. No acute respiratory distress noted  #Diabetes with CKD Lab Results  Component Value Date   HGBA1C 5.1 03/07/2021  Stable at this time Continue management per primary team  # Orthostatic hypotension, changed to Carvedilol 12.5mg  daily. Will order Midodrine 5mg  three times a day, hold if systolic BP greater than 250. BP during dialysis 99/59.   LOS: 7 Brigitta Pricer 11/2/202210:25 AM

## 2021-03-16 ENCOUNTER — Inpatient Hospital Stay: Payer: Medicaid Other | Admitting: Anesthesiology

## 2021-03-16 ENCOUNTER — Encounter
Admission: EM | Disposition: A | Discharge status: Skilled Nursing Facility | Payer: Self-pay | Source: Home / Self Care | Attending: Internal Medicine

## 2021-03-16 ENCOUNTER — Encounter: Payer: Self-pay | Admitting: Internal Medicine

## 2021-03-16 DIAGNOSIS — N186 End stage renal disease: Secondary | ICD-10-CM

## 2021-03-16 HISTORY — PX: AV FISTULA PLACEMENT: SHX1204

## 2021-03-16 LAB — BASIC METABOLIC PANEL
Anion gap: 11 (ref 5–15)
BUN: 38 mg/dL — ABNORMAL HIGH (ref 8–23)
CO2: 27 mmol/L (ref 22–32)
Calcium: 8.7 mg/dL — ABNORMAL LOW (ref 8.9–10.3)
Chloride: 98 mmol/L (ref 98–111)
Creatinine, Ser: 5.26 mg/dL — ABNORMAL HIGH (ref 0.44–1.00)
GFR, Estimated: 9 mL/min — ABNORMAL LOW (ref >60)
Glucose, Bld: 87 mg/dL (ref 70–99)
Potassium: 4 mmol/L (ref 3.5–5.1)
Sodium: 136 mmol/L (ref 135–145)

## 2021-03-16 LAB — CBC
HCT: 31.5 % — ABNORMAL LOW (ref 36.0–46.0)
Hemoglobin: 9.9 g/dL — ABNORMAL LOW (ref 12.0–15.0)
MCH: 31.6 pg (ref 26.0–34.0)
MCHC: 31.4 g/dL (ref 30.0–36.0)
MCV: 100.6 fL — ABNORMAL HIGH (ref 80.0–100.0)
Platelets: 250 10*3/uL (ref 150–400)
RBC: 3.13 MIL/uL — ABNORMAL LOW (ref 3.87–5.11)
RDW: 12.7 % (ref 11.5–15.5)
WBC: 6.2 10*3/uL (ref 4.0–10.5)
nRBC: 0 % (ref 0.0–0.2)

## 2021-03-16 LAB — GLUCOSE, CAPILLARY
Glucose-Capillary: 97 mg/dL (ref 70–99)
Glucose-Capillary: 99 mg/dL (ref 70–99)
Glucose-Capillary: 90 mg/dL (ref 70–99)
Glucose-Capillary: 80 mg/dL (ref 70–99)

## 2021-03-16 LAB — TYPE AND SCREEN
ABO/RH(D): O POS
Antibody Screen: NEGATIVE

## 2021-03-16 LAB — PROTIME-INR
INR: 1 (ref 0.8–1.2)
Prothrombin Time: 13.5 seconds (ref 11.4–15.2)

## 2021-03-16 LAB — APTT: aPTT: 66 seconds — ABNORMAL HIGH (ref 24–36)

## 2021-03-16 LAB — ABO/RH: ABO/RH(D): O POS

## 2021-03-16 LAB — MAGNESIUM: Magnesium: 1.8 mg/dL (ref 1.7–2.4)

## 2021-03-16 SURGERY — INSERTION OF ARTERIOVENOUS (AV) GORE-TEX GRAFT ARM
Anesthesia: General | Site: Arm Lower | Laterality: Left

## 2021-03-16 MED ORDER — CEFAZOLIN SODIUM-DEXTROSE 2-4 GM/100ML-% IV SOLN
INTRAVENOUS | Status: AC
  Filled 2021-03-16: qty 100

## 2021-03-16 MED ORDER — SODIUM CHLORIDE 0.9 % IV SOLN
INTRAVENOUS | Status: DC | PRN
  Administered 2021-03-16: 501 mL via INTRAVENOUS

## 2021-03-16 MED ORDER — OXYCODONE HCL 5 MG PO TABS: 5.0000 mg | ORAL_TABLET | Freq: Once | ORAL | Status: DC | PRN

## 2021-03-16 MED ORDER — PHENYLEPHRINE HCL (PRESSORS) 10 MG/ML IV SOLN
INTRAVENOUS | Status: DC | PRN
  Administered 2021-03-16 (×3): 100 ug via INTRAVENOUS

## 2021-03-16 MED ORDER — LIDOCAINE HCL (CARDIAC) PF 100 MG/5ML IV SOSY
PREFILLED_SYRINGE | INTRAVENOUS | Status: DC | PRN
  Administered 2021-03-16: 100 mg via INTRAVENOUS

## 2021-03-16 MED ORDER — FENTANYL CITRATE (PF) 100 MCG/2ML IJ SOLN
INTRAMUSCULAR | Status: DC | PRN
  Administered 2021-03-16: 25 ug via INTRAVENOUS
  Administered 2021-03-16: 50 ug via INTRAVENOUS
  Administered 2021-03-16: 25 ug via INTRAVENOUS

## 2021-03-16 MED ORDER — PROPOFOL 10 MG/ML IV BOLUS
INTRAVENOUS | Status: DC | PRN
  Administered 2021-03-16: 100 mg via INTRAVENOUS

## 2021-03-16 MED ORDER — HEPARIN SODIUM (PORCINE) 1000 UNIT/ML IJ SOLN
INTRAMUSCULAR | Status: DC | PRN
  Administered 2021-03-16: 3000 [IU] via INTRAVENOUS

## 2021-03-16 MED ORDER — FENTANYL CITRATE (PF) 100 MCG/2ML IJ SOLN
INTRAMUSCULAR | Status: AC
  Administered 2021-03-16: 50 ug via INTRAVENOUS
  Filled 2021-03-16: qty 2

## 2021-03-16 MED ORDER — HEMOSTATIC AGENTS (NO CHARGE) OPTIME
TOPICAL | Status: DC | PRN
  Administered 2021-03-16: 1 via TOPICAL

## 2021-03-16 MED ORDER — CHLORHEXIDINE GLUCONATE CLOTH 2 % EX PADS: 6.0000 | MEDICATED_PAD | Freq: Once | CUTANEOUS | Status: DC

## 2021-03-16 MED ORDER — ONDANSETRON HCL 4 MG/2ML IJ SOLN
INTRAMUSCULAR | Status: AC
  Filled 2021-03-16: qty 2

## 2021-03-16 MED ORDER — ASPIRIN EC 81 MG PO TBEC
81.0000 mg | DELAYED_RELEASE_TABLET | Freq: Every day | ORAL | Status: DC
  Administered 2021-03-16 – 2021-03-20 (×4): 81 mg via ORAL
  Filled 2021-03-16 (×4): qty 1

## 2021-03-16 MED ORDER — CLINDAMYCIN PHOSPHATE 600 MG/50ML IV SOLN: 600.0000 mg | INTRAVENOUS | Status: DC

## 2021-03-16 MED ORDER — CEFAZOLIN SODIUM-DEXTROSE 2-3 GM-%(50ML) IV SOLR
INTRAVENOUS | Status: DC | PRN
  Administered 2021-03-16: 2 g via INTRAVENOUS

## 2021-03-16 MED ORDER — ACETAMINOPHEN 10 MG/ML IV SOLN
INTRAVENOUS | Status: DC | PRN
  Administered 2021-03-16: 1000 mg via INTRAVENOUS

## 2021-03-16 MED ORDER — "VISTASEAL 4 ML SINGLE DOSE KIT "
PACK | CUTANEOUS | Status: DC | PRN
  Administered 2021-03-16: 4 mL via TOPICAL

## 2021-03-16 MED ORDER — SODIUM CHLORIDE 0.9 % IV SOLN: INTRAVENOUS | Status: DC | PRN

## 2021-03-16 MED ORDER — ACETAMINOPHEN 10 MG/ML IV SOLN: 1000.0000 mg | Freq: Once | INTRAVENOUS | Status: DC | PRN

## 2021-03-16 MED ORDER — MORPHINE SULFATE (PF) 2 MG/ML IV SOLN
2.0000 mg | INTRAVENOUS | Status: DC | PRN
  Administered 2021-03-16 – 2021-03-21 (×6): 2 mg via INTRAVENOUS
  Filled 2021-03-16 (×6): qty 1

## 2021-03-16 MED ORDER — ACETAMINOPHEN 10 MG/ML IV SOLN
INTRAVENOUS | Status: AC
  Filled 2021-03-16: qty 100

## 2021-03-16 MED ORDER — OXYCODONE HCL 5 MG/5ML PO SOLN: 5.0000 mg | Freq: Once | ORAL | Status: DC | PRN

## 2021-03-16 MED ORDER — FENTANYL CITRATE (PF) 100 MCG/2ML IJ SOLN
25.0000 ug | INTRAMUSCULAR | Status: DC | PRN
  Administered 2021-03-16: 50 ug via INTRAVENOUS

## 2021-03-16 MED ORDER — ONDANSETRON HCL 4 MG/2ML IJ SOLN
4.0000 mg | Freq: Once | INTRAMUSCULAR | Status: AC | PRN
  Administered 2021-03-16: 4 mg via INTRAVENOUS

## 2021-03-16 MED ORDER — STERILE WATER FOR IRRIGATION IR SOLN
Status: DC | PRN
  Administered 2021-03-16: 500 mL

## 2021-03-16 MED ORDER — FENTANYL CITRATE (PF) 100 MCG/2ML IJ SOLN
INTRAMUSCULAR | Status: AC
  Filled 2021-03-16: qty 2

## 2021-03-16 MED ORDER — ONDANSETRON HCL 4 MG/2ML IJ SOLN
INTRAMUSCULAR | Status: DC | PRN
  Administered 2021-03-16: 4 mg via INTRAVENOUS

## 2021-03-16 MED ORDER — CLINDAMYCIN PHOSPHATE 600 MG/50ML IV SOLN
INTRAVENOUS | Status: AC
  Filled 2021-03-16: qty 50

## 2021-03-16 SURGICAL SUPPLY — 58 items
BAG DECANTER FOR FLEXI CONT (MISCELLANEOUS) ×2 IMPLANT
BLADE SURG SZ11 CARB STEEL (BLADE) ×2 IMPLANT
BOOT SUTURE AID YELLOW STND (SUTURE) ×2 IMPLANT
BRUSH SCRUB EZ  4% CHG (MISCELLANEOUS) ×1
BRUSH SCRUB EZ 4% CHG (MISCELLANEOUS) ×1 IMPLANT
CHLORAPREP W/TINT 26 (MISCELLANEOUS) ×3 IMPLANT
CLIP SPRNG 6 S-JAW DBL (CLIP) ×1 IMPLANT
CLIP SPRNG 6MM S-JAW DBL (CLIP) ×2
DECANTER SPIKE VIAL GLASS SM (MISCELLANEOUS) ×1 IMPLANT
DERMABOND ADVANCED (GAUZE/BANDAGES/DRESSINGS) ×1
DERMABOND ADVANCED .7 DNX12 (GAUZE/BANDAGES/DRESSINGS) ×1 IMPLANT
ELECT CAUTERY BLADE 6.4 (BLADE) ×1 IMPLANT
ELECT REM PT RETURN 9FT ADLT (ELECTROSURGICAL) ×2
ELECTRODE REM PT RTRN 9FT ADLT (ELECTROSURGICAL) ×1 IMPLANT
GAUZE 4X4 16PLY ~~LOC~~+RFID DBL (SPONGE) ×2 IMPLANT
GLOVE SURG ENC MOIS LTX SZ7 (GLOVE) ×2 IMPLANT
GLOVE SURG SYN 7.0 (GLOVE) ×2 IMPLANT
GLOVE SURG SYN 7.0 PF PI (GLOVE) ×1 IMPLANT
GLOVE SURG UNDER LTX SZ7.5 (GLOVE) ×2 IMPLANT
GOWN STRL REUS W/ TWL LRG LVL3 (GOWN DISPOSABLE) ×1 IMPLANT
GOWN STRL REUS W/ TWL XL LVL3 (GOWN DISPOSABLE) ×2 IMPLANT
GOWN STRL REUS W/TWL LRG LVL3 (GOWN DISPOSABLE) ×1
GOWN STRL REUS W/TWL XL LVL3 (GOWN DISPOSABLE) ×2
GRAFT PROPATEN STD WALL 6X40 (Vascular Products) ×1 IMPLANT
HEMOSTAT SURGICEL 2X3 (HEMOSTASIS) ×2 IMPLANT
IV NS 500ML (IV SOLUTION) ×1
IV NS 500ML BAXH (IV SOLUTION) ×1 IMPLANT
KIT TURNOVER KIT A (KITS) ×2 IMPLANT
LABEL OR SOLS (LABEL) ×2 IMPLANT
LOOP RED MAXI  1X406MM (MISCELLANEOUS) ×1
LOOP VESSEL MAXI 1X406 RED (MISCELLANEOUS) ×1 IMPLANT
LOOP VESSEL MINI 0.8X406 BLUE (MISCELLANEOUS) ×1 IMPLANT
LOOPS BLUE MINI 0.8X406MM (MISCELLANEOUS) ×2
MANIFOLD NEPTUNE II (INSTRUMENTS) ×2 IMPLANT
NDL FILTER BLUNT 18X1 1/2 (NEEDLE) ×1 IMPLANT
NEEDLE FILTER BLUNT 18X 1/2SAF (NEEDLE) ×1
NEEDLE FILTER BLUNT 18X1 1/2 (NEEDLE) ×1 IMPLANT
NS IRRIG 500ML POUR BTL (IV SOLUTION) ×1 IMPLANT
PACK EXTREMITY ARMC (MISCELLANEOUS) ×2 IMPLANT
PAD PREP 24X41 OB/GYN DISP (PERSONAL CARE ITEMS) ×2 IMPLANT
SOLUTION CELL SAVER (CLIP) ×1 IMPLANT
STOCKINETTE 48X4 2 PLY STRL (GAUZE/BANDAGES/DRESSINGS) ×1 IMPLANT
STOCKINETTE STRL 4IN 9604848 (GAUZE/BANDAGES/DRESSINGS) ×2 IMPLANT
SUT GORETEX CV-6TTC-13 36IN (SUTURE) ×4 IMPLANT
SUT MNCRL AB 4-0 PS2 18 (SUTURE) ×2 IMPLANT
SUT PROLENE 6 0 BV (SUTURE) ×6 IMPLANT
SUT SILK 0 SH 30 (SUTURE) ×2 IMPLANT
SUT SILK 2 0 (SUTURE) ×1
SUT SILK 2-0 18XBRD TIE 12 (SUTURE) ×1 IMPLANT
SUT SILK 3 0 (SUTURE) ×1
SUT SILK 3-0 18XBRD TIE 12 (SUTURE) ×1 IMPLANT
SUT SILK 4 0 (SUTURE) ×1
SUT SILK 4-0 18XBRD TIE 12 (SUTURE) ×1 IMPLANT
SUT VIC AB 3-0 SH 27 (SUTURE) ×1
SUT VIC AB 3-0 SH 27X BRD (SUTURE) ×1 IMPLANT
SYR 20ML LL LF (SYRINGE) ×2 IMPLANT
SYR 3ML LL SCALE MARK (SYRINGE) ×2 IMPLANT
WATER STERILE IRR 500ML POUR (IV SOLUTION) ×2 IMPLANT

## 2021-03-16 NOTE — Progress Notes (Signed)
Blood bank notified that Type and screen band was removed from left arm and taped on right arm before left arm surgery

## 2021-03-16 NOTE — Progress Notes (Signed)
[facility]  ROUNDING NOTE   Subjective:   Katie [name] is a 65 year old female with past medical history including heart failure with preserved EF, chronic back pain chronic hypertension, and end-stage renal disease on hemodialysis. She has been admitted for ESRD (end stage renal disease) ([place]) [N18.6] Frequent falls [R29.6] Recurrent falls [R29.6] Syncope, unspecified syncope type [R55]  Patient resting quietly in bed Complains of cough, nonproductive Currently NPO for graft placement   Objective:  Vital signs in last 24 hours:  Temp:  [97.6 F (36.4 C)-99.2 F (37.3 C)] 98.5 F (36.9 C) (11/03 0754) Pulse Rate:  [68-75] 71 (11/03 0754) Resp:  [12-19] 18 (11/03 0754) BP: (99-160)/(53-72) 135/62 (11/03 0754) SpO2:  [95 %-97 %] 96 % (11/03 0754) Weight:  [81.4 kg] 81.4 kg (11/02 1146)  Weight change: -3.2 kg Filed Weights   03/14/21 1309 03/15/21 0905 03/15/21 1146  Weight: 84.7 kg 81.5 kg 81.4 kg    Intake/Output: I/O last 3 completed shifts: In: 240 [P.O.:240] Out: 44 [Urine:100]   Intake/Output this shift:  No intake/output data recorded.  Physical Exam: General: Resting in bed, in no acute distress  Head: Moist oral mucosal membranes  Lungs:  Respirations even,unlabored   Heart: S1S2, no rubs or gallops  Abdomen:  Soft, nontender,non distended  Extremities:  No peripheral edema.  Neurologic: Awake,alert,oriented  Skin: No acute  lesions or rashes  Access: Rt chest Permcath    Basic Metabolic Panel: Recent Labs  Lab 03/16/21 0656  NA 136  K 4.0  CL 98  CO2 27  GLUCOSE 87  BUN 38*  CREATININE 5.26*  CALCIUM 8.7*  MG 1.8     Liver Function Tests: No results for input(s): AST, ALT, ALKPHOS, BILITOT, PROT, ALBUMIN in the last 168 hours.  No results for input(s): LIPASE, AMYLASE in the last 168 hours. No results for input(s): AMMONIA in the last 168 hours.  CBC: Recent Labs  Lab 03/14/21 1309 03/16/21 0656  WBC 5.6 6.2  HGB  9.8* 9.9*  HCT 31.0* 31.5*  MCV 98.1 100.6*  PLT 257 250     Cardiac Enzymes: No results for input(s): CKTOTAL, CKMB, CKMBINDEX, TROPONINI in the last 168 hours.  BNP: Invalid input(s): POCBNP  CBG: Recent Labs  Lab 03/14/21 1122 03/14/21 2051 03/15/21 0759 03/15/21 2042 03/16/21 0919  GLUCAP 124* 90 104* 133* 97     Microbiology: Results for orders placed or performed during the hospital encounter of 03/07/21  Resp Panel by RT-PCR (Flu A&B, Covid) Nasopharyngeal Swab     Status: None   Collection Time: 03/07/21 12:55 PM   Specimen: Nasopharyngeal Swab; Nasopharyngeal(NP) swabs in vial transport medium  Result Value Ref Range Status   SARS Coronavirus 2 by RT PCR NEGATIVE NEGATIVE Final    Comment: (NOTE) SARS-CoV-2 target nucleic acids are NOT DETECTED.  The SARS-CoV-2 RNA is generally detectable in upper respiratory specimens during the acute phase of infection. The lowest concentration of SARS-CoV-2 viral copies this assay can detect is 138 copies/mL. A negative result does not preclude SARS-Cov-2 infection and should not be used as the sole basis for treatment or other patient management decisions. A negative result may occur with  improper specimen collection/handling, submission of specimen other than nasopharyngeal swab, presence of viral mutation(s) within the areas targeted by this assay, and inadequate number of viral copies(<138 copies/mL). A negative result must be combined with clinical observations, patient history, and epidemiological information. The expected result is Negative.  Fact Sheet for Patients:  EntrepreneurPulse.com.au  Fact Sheet for Healthcare Providers:  IncredibleEmployment.be  This test is no t yet approved or cleared by the [place] FDA and  has been authorized for detection and/or diagnosis of SARS-CoV-2 by FDA under an Emergency Use Authorization (EUA). This EUA will remain  in effect  (meaning this test can be used) for the duration of the COVID-19 declaration under Section 564(b)(1) of the Act, 21 U.S.C.section 360bbb-3(b)(1), unless the authorization is terminated  or revoked sooner.       Influenza A by PCR NEGATIVE NEGATIVE Final   Influenza B by PCR NEGATIVE NEGATIVE Final    Comment: (NOTE) The Xpert Xpress SARS-CoV-2/FLU/RSV plus assay is intended as an aid in the diagnosis of influenza from Nasopharyngeal swab specimens and should not be used as a sole basis for treatment. Nasal washings and aspirates are unacceptable for Xpert Xpress SARS-CoV-2/FLU/RSV testing.  Fact Sheet for Patients: EntrepreneurPulse.com.au  Fact Sheet for Healthcare Providers: IncredibleEmployment.be  This test is not yet approved or cleared by the [place] FDA and has been authorized for detection and/or diagnosis of SARS-CoV-2 by FDA under an Emergency Use Authorization (EUA). This EUA will remain in effect (meaning this test can be used) for the duration of the COVID-19 declaration under Section 564(b)(1) of the Act, 21 U.S.C. section 360bbb-3(b)(1), unless the authorization is terminated or revoked.  Performed at [facility], [address] [place], [place] [address]   MRSA Next Gen by PCR, Nasal     Status: None   Collection Time: 03/13/21  9:07 AM   Specimen: Nasal Mucosa; Nasal Swab  Result Value Ref Range Status   MRSA by PCR Next Gen NOT DETECTED NOT DETECTED Final    Comment: (NOTE) The GeneXpert MRSA Assay (FDA approved for NASAL specimens only), is one component of a comprehensive MRSA colonization surveillance program. It is not intended to diagnose MRSA infection nor to guide or monitor treatment for MRSA infections. Test performance is not FDA approved in patients less than 44 years old. Performed at [facility], [address] [place], [place] [address]     Coagulation Studies: Recent  Labs    03/16/21 0656  LABPROT 13.5  INR 1.0    Urinalysis: No results for input(s): COLORURINE, LABSPEC, PHURINE, GLUCOSEU, HGBUR, BILIRUBINUR, KETONESUR, PROTEINUR, UROBILINOGEN, NITRITE, LEUKOCYTESUR in the last 72 hours.  Invalid input(s): APPERANCEUR    Imaging: No results found.   Medications:     carvedilol  12.5 mg Oral QPM   Chlorhexidine Gluconate Cloth  6 each Topical Daily   heparin  5,000 Units Subcutaneous Q8H   insulin aspart  0-5 Units Subcutaneous QHS   insulin aspart  0-6 Units Subcutaneous QAC breakfast   lactulose  15 g Oral Daily   lidocaine  1 patch Transdermal Q24H   midodrine  5 mg Oral TID WC   polyethylene glycol  17 g Oral Daily   senna-docusate  1 tablet Oral QHS   traZODone  50 mg Oral QHS   venlafaxine  37.5 mg Oral BID WC   guaiFENesin, morphine injection, ondansetron (ZOFRAN) IV, oxyCODONE, topiramate  Assessment/ Plan:  [name] is a 65 y.o.  female with past medical history including heart failure with preserved EF, chronic back pain chronic hypertension, and end-stage renal disease on hemodialysis. She has been admitted for ESRD (end stage renal disease) ([place]) [N18.6] Frequent falls [R29.6] Recurrent falls [R29.6] Syncope, unspecified syncope type [R55]  [facility]/TTS/Rt Permcath/84 kg  #End-stage renal disease on hemodialysis.  Received dialysis yesterday to allow for fisutla placement today. Treatment terminated early due to hypercoagulation. Dialysis set primed with heparin and bolus' given without desired resolve. Plan to [facility] Perm[facility] this evening for overnight dwell and assess during treatment tomorrow. Appreciate dialysis coordinator confirming outpatient dialysis clinic chair in [place]. They are willing to start her treatments on Tuesday.    #Anemia of chronic kidney disease Lab Results  Component Value Date   HGB 9.9 (L) 03/16/2021  Hgb remains at goal   #Secondary Hyperparathyroidism: Lab  Results  Component Value Date   PTH 70 (H) 05/12/2020   CALCIUM 8.7 (L) 03/16/2021   PHOS 2.7 06/27/2020  Will continue monitoring bone mineral metabolism parameters Will obtain updated phosphorus in am  #Chronic diastolic heart failure: ECHO from 03/07/21 shows EF of 60-65%. No acute respiratory distress noted  #Diabetes with CKD Lab Results  Component Value Date   HGBA1C 5.1 03/07/2021  Stable  Continue management per primary team  # Orthostatic hypotension, changed to Carvedilol 12.5mg  daily. Will order Midodrine 5mg  three times a day, hold if systolic BP greater than 148. BP 135/62 this morning   LOS: 8 Katie [name] 11/3/20229:58 AM

## 2021-03-16 NOTE — Progress Notes (Addendum)
PT Cancellation Note  Patient Details Name: Katie [name] MRN: [id number] DOB: Sep 27, 1955   Cancelled Treatment:    Reason Eval/Treat Not Completed: Patient at procedure or test/unavailable. PT will follow up as appropriate.   Minna Merritts, PT, MPT  Percell Locus 03/16/2021, 3:14 PM

## 2021-03-16 NOTE — TOC Progression Note (Signed)
Transition of Care [facility]) - Progression Note    Patient Details  Name: Katie [name] MRN: [id number] Date of Birth: 1955-07-22  [facility] CM/SW Contact  Beverly Sessions, RN Phone Number: 03/16/2021, 9:38 AM  Clinical Narrative:     Updated clinical sent to [facility] with Eddie North   Expected Discharge Plan: Skilled Nursing Facility Barriers to Discharge: Continued Medical Work up  Expected Discharge Plan and Services Expected Discharge Plan: [place] arrangements for the past 2 months: Single Family Home                                       Social Determinants of Health (SDOH) Interventions    Readmission Risk Interventions Readmission Risk Prevention Plan 03/08/2021  Transportation Screening Complete  Medication Review Press photographer) Complete  HRI or [place] Complete

## 2021-03-16 NOTE — Plan of Care (Signed)
  Problem: Health Behavior/Discharge Planning: Goal: Ability to manage health-related needs will improve Outcome: Progressing   Problem: Clinical Measurements: Goal: Ability to maintain clinical measurements within normal limits will improve Outcome: Progressing Goal: Will remain free from infection Outcome: Progressing Goal: Diagnostic test results will improve Outcome: Progressing Goal: Respiratory complications will improve Outcome: Progressing Goal: Cardiovascular complication will be avoided Outcome: Progressing   Problem: Activity: Goal: Risk for activity intolerance will decrease Outcome: Progressing   Problem: Nutrition: Goal: Adequate nutrition will be maintained Outcome: Progressing   Problem: Coping: Goal: Level of anxiety will decrease Outcome: Progressing   Problem: Elimination: Goal: Will not experience complications related to bowel motility Outcome: Progressing Goal: Will not experience complications related to urinary retention Outcome: Progressing   Problem: Pain Managment: Goal: General experience of comfort will improve Outcome: Progressing   Problem: Safety: Goal: Ability to remain free from injury will improve Outcome: Progressing   Problem: Skin Integrity: Goal: Risk for impaired skin integrity will decrease Outcome: Progressing   Problem: Education: Goal: Individualized Educational Video(s) Outcome: Progressing   Problem: Metabolic: Goal: Ability to maintain appropriate glucose levels will improve Outcome: Progressing   Problem: Nutritional: Goal: Maintenance of adequate nutrition will improve Outcome: Progressing Goal: Progress toward achieving an optimal weight will improve Outcome: Progressing

## 2021-03-16 NOTE — Interval H&P Note (Signed)
History and Physical Interval Note:  03/16/2021 12:13 PM  Katie [name]  has presented today for surgery, with the diagnosis of ESRD.  The various methods of treatment have been discussed with the patient and family. After consideration of risks, benefits and other options for treatment, the patient has consented to  Procedure(s): INSERTION OF ARTERIOVENOUS (AV) GORE-TEX GRAFT ARM (Left) as a surgical intervention.  The patient's history has been reviewed, patient examined, no change in status, stable for surgery.  I have reviewed the patient's chart and labs.  Questions were answered to the patient's satisfaction.     Leotis Pain

## 2021-03-16 NOTE — Progress Notes (Signed)
PROGRESS NOTE    Katie [name]  LZJ:[id number] DOB: [birth year] DOA: 03/07/2021 PCP: Patient, [name] (Inactive)    Brief Narrative:  Katie [name] is a 65 y.o. female with medical history significant for hypertension, end-stage renal disease on hemodialysis TuThSa, heart failure preserved ejection fraction, chronic low back pain, presents emergency department for chief concerns of frequent falls and syncope after dialysis.. Found to be hypotensive.  She does have orthostatic hypotension.  Her BP meds were changed and started on midodrine today. PT recommended SNF. Plan for aVF creation left upper extremity on 11/3   Assessment & Plan:   Active Problems:   Hypertension   Type 2 diabetes mellitus (HCC)   Chronic kidney disease (CKD), stage IV (severe) (HCC)   Pulmonary hypertension, unspecified ([place])   ESRD on dialysis ([place])   Frequent falls   Homelessness   Financial difficulty  Increased fall and weakness Likely secondary to postdialysis treatment and hypotension B12, B1, vitamin D and procalcitonin was ordered for work-up Echo nml EF.  Fall precautions 11/1 PT recommending SNF Also needs to change HD to [facility] dose decreased Midodrine added Continue therapy evaluations and fall precautions  End-stage renal disease on hemodialysis Nephrology following for inpatient HD needs Plan to find HD center at [facility] Plan: left upper extremity dialysis graft creation on Thursday with vascular surgery Patient has been n.p.o. since midnight   Constipation Continue current bowel regimen Monitor for BM Lactulose added, if fails will need enema  Hypertension Orthostatic hypotension Coreg dose decreased Midodrine 5 3 times daily SCDs Avoid hypotension  Depression/anxiety continue venlafaxine   Insomnia trazodone 50 mg nightly    Headaches topiramate 50 mg p.o. twice daily as needed for headaches   History of diabetes type 2 presumed improved  with end-stage renal disease BG stable Continue RISS   DVT prophylaxis: SQ heparin Code Status: Full Family Communication: None today Disposition Plan: Status is: Inpatient  Remains inpatient appropriate because: Plan for left upper extremity AV fistula creation Thursday 11/3       Level of care: Med-Surg  Consultants:  Nephrology Vascular surgery  Procedures:  AV fistula creation planned 11/3  Antimicrobials: None   Subjective: Patient seen and examined.  Resting comfortably bed.  No visible distress.  No pain complaints.  N.p.o. for procedure.  Objective: Vitals:   03/15/21 1547 03/15/21 2155 03/16/21 0301 03/16/21 0754  BP: (!) 147/69 (!) 160/72 (!) 135/53 135/62  Pulse: 75 75 72 71  Resp:  19 18 18   Temp: 97.9 F (36.6 C) 99.2 F (37.3 C) 97.6 F (36.4 C) 98.5 F (36.9 C)  TempSrc: Oral Oral  Oral  SpO2: 96% 97% 96% 96%  Weight:      Height:        Intake/Output Summary (Last 24 hours) at 03/16/2021 1131 Last data filed at 03/15/2021 2053 Gross per 24 hour  Intake 240 ml  Output 0 ml  Net 240 ml   Filed Weights   03/14/21 1309 03/15/21 0905 03/15/21 1146  Weight: 84.7 kg 81.5 kg 81.4 kg    Examination:  General exam: No acute distress Respiratory system: Clear lungs.  Normal work of breathing.  Room air Cardiovascular system: S1-S2, RRR, no murmurs, no pedal edema Gastrointestinal system: Soft, NT/ND, normal bowel sounds Central nervous system: Alert and oriented. No focal neurological deficits. Extremities: Diffusely decreased power bilaterally Skin: No rashes, lesions or ulcers Psychiatry: Judgement and insight appear normal. Mood & affect appropriate.     Data  Reviewed: I have personally reviewed following labs and imaging studies  CBC: Recent Labs  Lab 03/14/21 1309 03/16/21 0656  WBC 5.6 6.2  HGB 9.8* 9.9*  HCT 31.0* 31.5*  MCV 98.1 100.6*  PLT 257 144   Basic Metabolic Panel: Recent Labs  Lab 03/16/21 0656  NA 136  K  4.0  CL 98  CO2 27  GLUCOSE 87  BUN 38*  CREATININE 5.26*  CALCIUM 8.7*  MG 1.8   GFR: Estimated Creatinine Clearance: 11.9 mL/min (A) (by C-G formula based on SCr of 5.26 mg/dL (H)). Liver Function Tests: No results for input(s): AST, ALT, ALKPHOS, BILITOT, PROT, ALBUMIN in the last 168 hours. No results for input(s): LIPASE, AMYLASE in the last 168 hours. No results for input(s): AMMONIA in the last 168 hours. Coagulation Profile: Recent Labs  Lab 03/16/21 0656  INR 1.0   Cardiac Enzymes: No results for input(s): CKTOTAL, CKMB, CKMBINDEX, TROPONINI in the last 168 hours. BNP (last 3 results) No results for input(s): PROBNP in the last 8760 hours. HbA1C: No results for input(s): HGBA1C in the last 72 hours. CBG: Recent Labs  Lab 03/14/21 1122 03/14/21 2051 03/15/21 0759 03/15/21 2042 03/16/21 0919  GLUCAP 124* 90 104* 133* 97   Lipid Profile: No results for input(s): CHOL, HDL, LDLCALC, TRIG, CHOLHDL, LDLDIRECT in the last 72 hours. Thyroid Function Tests: No results for input(s): TSH, T4TOTAL, FREET4, T3FREE, THYROIDAB in the last 72 hours. Anemia Panel: No results for input(s): VITAMINB12, FOLATE, FERRITIN, TIBC, IRON, RETICCTPCT in the last 72 hours. Sepsis Labs: No results for input(s): PROCALCITON, LATICACIDVEN in the last 168 hours.  Recent Results (from the past 240 hour(s))  Resp Panel by RT-PCR (Flu A&B, Covid) Nasopharyngeal Swab     Status: None   Collection Time: 03/07/21 12:55 PM   Specimen: Nasopharyngeal Swab; Nasopharyngeal(NP) swabs in vial transport medium  Result Value Ref Range Status   SARS Coronavirus 2 by RT PCR NEGATIVE NEGATIVE Final    Comment: (NOTE) SARS-CoV-2 target nucleic acids are NOT DETECTED.  The SARS-CoV-2 RNA is generally detectable in upper respiratory specimens during the acute phase of infection. The lowest concentration of SARS-CoV-2 viral copies this assay can detect is 138 copies/mL. A negative result does not  preclude SARS-Cov-2 infection and should not be used as the sole basis for treatment or other patient management decisions. A negative result may occur with  improper specimen collection/handling, submission of specimen other than nasopharyngeal swab, presence of viral mutation(s) within the areas targeted by this assay, and inadequate number of viral copies(<138 copies/mL). A negative result must be combined with clinical observations, patient history, and epidemiological information. The expected result is Negative.  Fact Sheet for Patients:  EntrepreneurPulse.com.au  Fact Sheet for Healthcare Providers:  IncredibleEmployment.be  This test is no t yet approved or cleared by the [place] FDA and  has been authorized for detection and/or diagnosis of SARS-CoV-2 by FDA under an Emergency Use Authorization (EUA). This EUA will remain  in effect (meaning this test can be used) for the duration of the COVID-19 declaration under Section 564(b)(1) of the Act, 21 U.S.C.section 360bbb-3(b)(1), unless the authorization is terminated  or revoked sooner.       Influenza A by PCR NEGATIVE NEGATIVE Final   Influenza B by PCR NEGATIVE NEGATIVE Final    Comment: (NOTE) The Xpert Xpress SARS-CoV-2/FLU/RSV plus assay is intended as an aid in the diagnosis of influenza from Nasopharyngeal swab specimens and should not be used as a  sole basis for treatment. Nasal washings and aspirates are unacceptable for Xpert Xpress SARS-CoV-2/FLU/RSV testing.  Fact Sheet for Patients: EntrepreneurPulse.com.au  Fact Sheet for Healthcare Providers: IncredibleEmployment.be  This test is not yet approved or cleared by the [place] FDA and has been authorized for detection and/or diagnosis of SARS-CoV-2 by FDA under an Emergency Use Authorization (EUA). This EUA will remain in effect (meaning this test can be used) for the  duration of the COVID-19 declaration under Section 564(b)(1) of the Act, 21 U.S.C. section 360bbb-3(b)(1), unless the authorization is terminated or revoked.  Performed at [facility], [address] [place], [place] [address]   MRSA Next Gen by PCR, Nasal     Status: None   Collection Time: 03/13/21  9:07 AM   Specimen: Nasal Mucosa; Nasal Swab  Result Value Ref Range Status   MRSA by PCR Next Gen NOT DETECTED NOT DETECTED Final    Comment: (NOTE) The GeneXpert MRSA Assay (FDA approved for NASAL specimens only), is one component of a comprehensive MRSA colonization surveillance program. It is not intended to diagnose MRSA infection nor to guide or monitor treatment for MRSA infections. Test performance is not FDA approved in patients less than 62 years old. Performed at [facility], [address], [place], [place] [address]          Radiology Studies: No results found.      Scheduled Meds:  carvedilol  12.5 mg Oral QPM   Chlorhexidine Gluconate Cloth  6 each Topical Daily   heparin  5,000 Units Subcutaneous Q8H   insulin aspart  0-5 Units Subcutaneous QHS   insulin aspart  0-6 Units Subcutaneous QAC breakfast   lactulose  15 g Oral Daily   lidocaine  1 patch Transdermal Q24H   midodrine  5 mg Oral TID WC   polyethylene glycol  17 g Oral Daily   senna-docusate  1 tablet Oral QHS   traZODone  50 mg Oral QHS   venlafaxine  37.5 mg Oral BID WC   Continuous Infusions:   LOS: 8 days    Time spent: 25 minutes    Sidney Ace, MD [facility]   If 7PM-7AM, please contact night-coverage  03/16/2021, 11:31 AM

## 2021-03-16 NOTE — Progress Notes (Signed)
Occupational Therapy Treatment Patient Details Name: Katie [name] MRN: [id number] DOB: June 14, 1955 Today's Date: 03/16/2021   History of present illness Pt is a 65 y.o. female with medical history significant for hypertension, DM, end-stage renal disease on hemodialysis TuThSa, heart failure preserved ejection fraction, chronic low back pain, presents emergency department for chief concerns of frequent falls and syncope after dialysis. Pt struggles with homelessness and finanacial difficulty. MD assessment includes: increased falls and weakness, depression/anxiety, insominia, and HAs.   OT comments  Ms. Peach seen for OT treatment on this date. Upon arrival to room pt awake/alert. Pt seated upright in bed, reporting anxiety about upcoming transition to facility. Pt agreeable to tx.  Requiring CGA for functional mobility, standing ~ 3 min x2, assist needed for dizziness. Intermittent SUP for UB grooming- hair brushing and hairwashing, seated EOB ~ 10 min. Pt making good progress toward goals. Pt continues to benefit from skilled OT services to maximize return to PLOF and minimize risk of future falls, injury, caregiver burden, and readmission. Will continue to follow POC. Discharge recommendation remains appropriate.     Recommendations for follow up therapy are one component of a multi-disciplinary discharge planning process, led by the attending physician.  Recommendations may be updated based on patient status, additional functional criteria and insurance authorization.    Follow Up Recommendations  Skilled nursing-short term rehab (<3 hours/day)    Assistance Recommended at Discharge Intermittent Supervision/Assistance  Equipment Recommendations  Other (comment) (Defer to next venue of care)    Recommendations for Other Services      Precautions / Restrictions Precautions Precautions: Fall Restrictions Weight Bearing Restrictions: No       Mobility Bed Mobility Overal bed mobility:  Modified Independent Bed Mobility: Supine to Sit;Sit to Supine     Supine to sit: Supervision;HOB elevated Sit to supine: Supervision        Transfers Overall transfer level: Needs assistance Equipment used: Rolling walker (2 wheels) Transfers: Sit to/from Stand Sit to Stand: Min guard                 Balance Overall balance assessment: Needs assistance Sitting-balance support: Feet supported;No upper extremity supported Sitting balance-Leahy Scale: Good     Standing balance support: Bilateral upper extremity supported;During functional activity Standing balance-Leahy Scale: Fair                             ADL either performed or assessed with clinical judgement   ADL Overall ADL's : Needs assistance/impaired                                       General ADL Comments: CGA for functional mobility, standing ~ 3 min x2, assist needed for dizziness. Intermittent SUP UB grooming, hairbrushing, hairwashing, seated EOB ~ 10 min.      Cognition Arousal/Alertness: Awake/alert Behavior During Therapy: WFL for tasks assessed/performed Overall Cognitive Status: Within Functional Limits for tasks assessed                                            Exercises Exercises: Other exercises Other Exercises Other Exercises: Pt educ re: OT role, d/c recs, falls prevention Other Exercises: sup<>sit, sit<>stand, UB grooming, standing tolerance, sitting tolerance   Shoulder Instructions  General Comments      Pertinent Vitals/ Pain       Pain Assessment: No/denies pain  Home Living                                          Prior Functioning/Environment              Frequency  Min 1X/week        Progress Toward Goals  OT Goals(current goals can now be found in the care plan section)  Progress towards OT goals: Progressing toward goals  Acute Rehab OT Goals Patient Stated Goal: to get  better OT Goal Formulation: With patient Time For Goal Achievement: 03/24/21 Potential to Achieve Goals: Good ADL Goals Pt Will Perform Grooming: standing;with supervision Pt Will Perform Lower Body Dressing: sit to/from stand;with supervision Pt Will Transfer to Toilet: ambulating;with supervision;regular height toilet  Plan Discharge plan remains appropriate    Co-evaluation                 AM-PAC OT "6 Clicks" Daily Activity     Outcome Measure   Help from another person eating meals?: None Help from another person taking care of personal grooming?: A Little Help from another person toileting, which includes using toliet, bedpan, or urinal?: A Lot Help from another person bathing (including washing, rinsing, drying)?: A Little Help from another person to put on and taking off regular upper body clothing?: A Little Help from another person to put on and taking off regular lower body clothing?: A Lot 6 Click Score: 17    End of Session Equipment Utilized During Treatment: Rolling walker (2 wheels);Gait belt  OT Visit Diagnosis: Unsteadiness on feet (R26.81);Repeated falls (R29.6);Muscle weakness (generalized) (M62.81);History of falling (Z91.81)   Activity Tolerance Patient tolerated treatment well   Patient Left in bed;with call bell/phone within reach;with bed alarm set   Nurse Communication          Time: 2536-6440 OT Time Calculation (min): 24 min  Charges: OT General Charges $OT Visit: 1 Visit OT Treatments $Self Care/Home Management : 23-37 mins  Katie [name], Katie [name] 03/16/2021, 1:55 PM

## 2021-03-16 NOTE — Progress Notes (Signed)
Mobility Specialist - Progress Note   03/16/21 1200  Mobility  Activity Refused mobility  Mobility performed by Mobility specialist    Pt off unit at this time for procedure. Will attempt session another date/time.    Kathee Delton Mobility Specialist 03/16/21, 12:13 PM

## 2021-03-16 NOTE — Transfer of Care (Signed)
Immediate Anesthesia Transfer of Care Note  Patient: Katie [name]  Procedure(s) Performed: INSERTION OF ARTERIOVENOUS (AV) GORE-TEX GRAFT ARM (Left: Arm Lower)  Patient Location: PACU  Anesthesia Type:General  Level of Consciousness: awake, alert  and oriented  Airway & Oxygen Therapy: Patient Spontanous Breathing and Patient connected to face mask oxygen  Post-op Assessment: Report given to RN and Post -op Vital signs reviewed and stable  Post vital signs: Reviewed and stable  Last Vitals:  Vitals Value Taken Time  BP    Temp    Pulse    Resp    SpO2      Last Pain:  Vitals:   03/16/21 1142  TempSrc:   PainSc: 5       Patients Stated Pain Goal: 0 (13/14/38 8875)  Complications: No notable events documented.

## 2021-03-16 NOTE — Op Note (Addendum)
VEIN AND VASCULAR SURGERY  OPERATIVE NOTE   PROCEDURE:  Left upper arm brachial artery to axillary vein arteriovenous graft  PRE-OPERATIVE DIAGNOSIS: 1. end stage renal disease    POST-OPERATIVE DIAGNOSIS: same  SURGEON: Leotis Pain  ASSISTANT(S): Hezzie Bump, PA-C  ANESTHESIA: general  ESTIMATED BLOOD LOSS: 25 cc  FINDING(S): none  SPECIMEN(S):  None  INDICATIONS:   Katie [name] is a 65 y.o. female who presents with end stage renal disease and need for permanent access.  Risk, benefits, and alternatives to access surgery were discussed.  The patient is aware the risks include but are not limited to: bleeding, infection, steal syndrome, nerve damage, ischemic monomelic neuropathy, failure to mature, and need for additional procedures.  The patient is aware of the risks and elects to proceed forward. An assistant was present during the procedure to help facilitate the exposure and expedite the procedure.   DESCRIPTION: After full informed written consent was obtained from the patient, the patient was brought back to the operating room and placed supine upon the operating table.  The patient was given IV antibiotics prior to proceeding. The assistant provided retraction and mobilization to help facilitate exposure and expedite the procedure throughout the entire procedure.  This included following suture, using retractors, and optimizing lighting.  After obtaining adequate sedation, the patient was prepped and draped in standard fashion for a left arm access procedure.  I turned my attention first to the antecubitum.   I made an incision over the brachial artery, and dissected down through the subcutaneous tissue to the fascia carefully and was able to dissect out the brachial artery.  The artery was about patent and of adequate size to support a graft.  It was controlled proximally and distally with vessel loops and then I turned my attention to the high bicipital groove in  the axilla.  I made an incision and dissected down through the subcutaneous tissue and fascia until I reached the axillary vein.  It was patent and adequate size for graft creation.  I then dissected this vein proximally and distal and prepared it for control with Bulldog clamps.  I took a Dietitian and dissected from the antecubital up to the axillary incision.  Then I delivered the 6 mm Propaten graft, through this metal tunneler and then pulled out the metal tunneler leaving the graft in place and making sure the line was up for orientation.  I then gave the patient 3000 units of heparin to gain some anticoagulation.  After waiting 3 minutes, I placed the brachial artery under tension proximally and distally with vessel loops, made an arteriotomy and extended it with a Potts scissor.  I sewed the graft to this arteriotomy with a running stitch of CV-6 suture.  At this point, then I completed the anastomosis in the usual fashion.  I released the vessel loops on the inflow and allowed the artery to decompress through the graft. There was good pulsatile bleeding through this graft.  I clamped the graft near its arterial anastomosis and sucked out all the blood in the graft and loaded the graft with heparinized saline.  At this point, I pulled the graft to appropriate length and reset my exposure of the axillary vein.  Then, I controlled the vein with Bulldog clamps.  An anterior wall venotomy was created with an 11 blade and extended with Potts scissors.  I then spatulated the graft to facilitate an end-to- side anastomosis matching the arteriotomy.  In the process  of spatulating, I cut the graft to appropriate length for this anastomosis.  This graft was sewn to the vein in an end-to-side configuration with a running CV-6 suture.  Prior to completing this anastomosis, I allowed the vein to back bleed and then I also allowed the artery to bleed in an antegrade fashion.  I completed this anastomosis in the  usual fashion, and then irrigated out the high bicipital exposure and then placed Surgicel and Vistacel.  I then turned my attention back to the antecubitum.  There was pulsatile flow in the artery beyond the graft.   At this point, I washed out the antecubital incision. Surgicel and Evicel were then placed. There was no more active bleeding.  The subcutaneous tissue was reapproximated with a running stitch of 3-0 Vicryl.  The skin was then reapproximated with a running subcuticular 4-0 Monocryl.  The skin was then cleaned, dried, and Dermabond used to reinforce the skin closure.  We then turned our attention to the axillary incision.  The subcutaneous tissue was repaired with running stitch of 3-0 Vicryl.  The skin was then reapproximated with running subcuticular 4-0 Monocryl.  The skin was then cleaned, dried, and then the skin closure was reinforced with Dermabond.  The patient was then awakened from anesthesia and taken to the recovery room in stable condition having tolerated the procedure well.    COMPLICATIONS: None  CONDITION: Stable   Leotis Pain 03/16/2021 2:22 PM   This note was created with Dragon Medical transcription system. Any errors in dictation are purely unintentional.

## 2021-03-16 NOTE — Anesthesia Procedure Notes (Signed)
Procedure Name: LMA Insertion Date/Time: 03/16/2021 12:51 PM Performed by: Aline Brochure, CRNA Pre-anesthesia Checklist: Patient identified, Patient being monitored, Timeout performed, Emergency Drugs available and Suction available Patient Re-evaluated:Patient Re-evaluated prior to induction Oxygen Delivery Method: Circle system utilized Preoxygenation: Pre-oxygenation with 100% oxygen Induction Type: IV induction Ventilation: Mask ventilation without difficulty LMA: LMA inserted LMA Size: 3.5 Tube type: Oral Number of attempts: 1 Placement Confirmation: positive ETCO2 and breath sounds checked- equal and bilateral Tube secured with: Tape Dental Injury: Teeth and Oropharynx as per pre-operative assessment

## 2021-03-16 NOTE — Progress Notes (Signed)
1618- pt returned A&Ox4, VSS and NAD noted. Incision to LFA post fistula creation C/D/I and OTA with dermabond. Will continue to monitor for any additional needs.

## 2021-03-16 NOTE — Anesthesia Preprocedure Evaluation (Addendum)
Anesthesia Evaluation  Patient identified by MRN, date of birth, ID band Patient awake    Reviewed: Allergy & Precautions, NPO status , Patient's Chart, lab work & pertinent test results  History of Anesthesia Complications Negative for: history of anesthetic complications  Airway Mallampati: II  TM Distance: >3 FB Neck ROM: Full    Dental  (+) Poor Dentition   Pulmonary neg pulmonary ROS, neg sleep apnea, neg COPD, Patient abstained from smoking.Not current smoker, former smoker,    Pulmonary exam normal breath sounds clear to auscultation       Cardiovascular Exercise Tolerance: Good METShypertension, Pt. 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) No date: Hypertension No date: Renal disorder No date: Type 2 diabetes mellitus (HCC)  Reproductive/Obstetrics                            Anesthesia Physical Anesthesia Plan  ASA: 4  Anesthesia Plan: General   Post-op Pain Management:    Induction: Intravenous  PONV Risk Score and Plan: 3 and Ondansetron, Dexamethasone, Treatment may vary due to age or medical condition and Midazolam  Airway Management Planned: LMA  Additional Equipment: None  Intra-op Plan:   Post-operative Plan: Extubation in OR  Informed Consent: I have reviewed the patients History and Physical, chart, labs and discussed  the procedure including the risks, benefits and alternatives for the proposed anesthesia with the patient or authorized representative who has indicated his/her understanding and acceptance.     Dental advisory given  Plan Discussed with: CRNA and Surgeon  Anesthesia Plan Comments: (Discussed risks of anesthesia with patient, including PONV, sore throat, lip/dental damage. Rare risks discussed as well, such as cardiorespiratory and neurological sequelae, and allergic reactions. Discussed the role of CRNA in patient's perioperative care. Patient understands.)       Anesthesia Quick Evaluation

## 2021-03-17 ENCOUNTER — Encounter: Payer: Self-pay | Admitting: Vascular Surgery

## 2021-03-17 LAB — GLUCOSE, CAPILLARY
Glucose-Capillary: 96 mg/dL (ref 70–99)
Glucose-Capillary: 92 mg/dL (ref 70–99)

## 2021-03-17 MED ORDER — ALTEPLASE 2 MG IJ SOLR
INTRAMUSCULAR | Status: AC
  Filled 2021-03-17: qty 4

## 2021-03-17 MED ORDER — HEPARIN SODIUM (PORCINE) 1000 UNIT/ML IJ SOLN
INTRAMUSCULAR | Status: AC
  Filled 2021-03-17: qty 1

## 2021-03-17 NOTE — TOC Progression Note (Signed)
Transition of Care [facility]) - Progression Note    Patient Details  Name: Katie [name] MRN: [id number] Date of Birth: 03/06/1956  [facility]) CM/SW Contact  Beverly Sessions, RN Phone Number: 03/17/2021, 3:44 PM  Clinical Narrative:    Per HD coordinator since patient had HD today, she will not start outpatient HD till Tuesday    Expected Discharge Plan: [place] Barriers to Discharge: Continued Medical Work up  Expected Discharge Plan and Services Expected Discharge Plan: [place] arrangements for the past 2 months: Single Family Home                                       Social Determinants of Health (SDOH) Interventions    Readmission Risk Interventions Readmission Risk Prevention Plan 03/08/2021  Transportation Screening Complete  Medication Review Press photographer) Complete  HRI or [place] Complete

## 2021-03-17 NOTE — Progress Notes (Addendum)
[facility]  ROUNDING NOTE   Subjective:   Katie [name] is a 65 year old female with past medical history including heart failure with preserved EF, chronic back pain chronic hypertension, and end-stage renal disease on hemodialysis. She has been admitted for ESRD (end stage renal disease) ([place]) [N18.6] Frequent falls [R29.6] Recurrent falls [R29.6] Syncope, unspecified syncope type [R55]  Patient seen and evaluated during dialysis   HEMODIALYSIS FLOWSHEET:  Blood Flow Rate (mL/min): 400 mL/min Arterial Pressure (mmHg): -180 mmHg Venous Pressure (mmHg): 140 mmHg Transmembrane Pressure (mmHg): 50 mmHg Ultrafiltration Rate (mL/min): 300 mL/min Dialysate Flow Rate (mL/min): 500 ml/min Conductivity: Machine : 13.8 Conductivity: Machine : 13.8 Dialysis Fluid Bolus: Normal Saline Bolus Amount (mL): 100 mL  AVG placed yesterday, reports soreness No other complaints at this time   Objective:  Vital signs in last 24 hours:  Temp:  [97.5 F (36.4 C)-98.9 F (37.2 C)] 98.9 F (37.2 C) (11/04 0734) Pulse Rate:  [72-85] 77 (11/04 0734) Resp:  [12-18] 14 (11/04 1100) BP: (73-142)/(43-58) 98/52 (11/04 1100) SpO2:  [93 %-99 %] 93 % (11/04 0734)  Weight change: -0.1 kg Filed Weights   03/15/21 0905 03/15/21 1146 03/16/21 1206  Weight: 81.5 kg 81.4 kg 81.4 kg    Intake/Output: I/O last 3 completed shifts: In: 500 [I.V.:400; IV Piggyback:100] Out: 6 [Urine:1; Blood:5]   Intake/Output this shift:  Total I/O In: 200 [P.O.:200] Out: 0   Physical Exam: General: Resting in bed, in no acute distress  Head: Moist oral mucosal membranes  Lungs:  Respirations even,unlabored   Heart: S1S2, no rubs or gallops  Abdomen:  Soft, nontender,non distended  Extremities:  No peripheral edema. LUE erythema, edema  Neurologic: Awake,alert,oriented  Skin: No acute  lesions or rashes  Access: Rt chest Permcath, LUE AVG placed on 50/0/93    Basic Metabolic Panel: Recent Labs   Lab 03/16/21 0656  NA 136  K 4.0  CL 98  CO2 27  GLUCOSE 87  BUN 38*  CREATININE 5.26*  CALCIUM 8.7*  MG 1.8     Liver Function Tests: No results for input(s): AST, ALT, ALKPHOS, BILITOT, PROT, ALBUMIN in the last 168 hours.  No results for input(s): LIPASE, AMYLASE in the last 168 hours. No results for input(s): AMMONIA in the last 168 hours.  CBC: Recent Labs  Lab 03/14/21 1309 03/16/21 0656  WBC 5.6 6.2  HGB 9.8* 9.9*  HCT 31.0* 31.5*  MCV 98.1 100.6*  PLT 257 250     Cardiac Enzymes: No results for input(s): CKTOTAL, CKMB, CKMBINDEX, TROPONINI in the last 168 hours.  BNP: Invalid input(s): POCBNP  CBG: Recent Labs  Lab 03/16/21 0919 03/16/21 1145 03/16/21 1442 03/16/21 1648 03/17/21 0730  GLUCAP 97 99 90 80 96     Microbiology: Results for orders placed or performed during the hospital encounter of 03/07/21  Resp Panel by RT-PCR (Flu A&B, Covid) Nasopharyngeal Swab     Status: None   Collection Time: 03/07/21 12:55 PM   Specimen: Nasopharyngeal Swab; Nasopharyngeal(NP) swabs in vial transport medium  Result Value Ref Range Status   SARS Coronavirus 2 by RT PCR NEGATIVE NEGATIVE Final    Comment: (NOTE) SARS-CoV-2 target nucleic acids are NOT DETECTED.  The SARS-CoV-2 RNA is generally detectable in upper respiratory specimens during the acute phase of infection. The lowest concentration of SARS-CoV-2 viral copies this assay can detect is 138 copies/mL. A negative result does not preclude SARS-Cov-2 infection and should not be used as the sole basis for treatment  or other patient management decisions. A negative result may occur with  improper specimen collection/handling, submission of specimen other than nasopharyngeal swab, presence of viral mutation(s) within the areas targeted by this assay, and inadequate number of viral copies(<138 copies/mL). A negative result must be combined with clinical observations, patient history, and  epidemiological information. The expected result is Negative.  Fact Sheet for Patients:  EntrepreneurPulse.com.au  Fact Sheet for Healthcare Providers:  IncredibleEmployment.be  This test is no t yet approved or cleared by the [place] FDA and  has been authorized for detection and/or diagnosis of SARS-CoV-2 by FDA under an Emergency Use Authorization (EUA). This EUA will remain  in effect (meaning this test can be used) for the duration of the COVID-19 declaration under Section 564(b)(1) of the Act, 21 U.S.C.section 360bbb-3(b)(1), unless the authorization is terminated  or revoked sooner.       Influenza A by PCR NEGATIVE NEGATIVE Final   Influenza B by PCR NEGATIVE NEGATIVE Final    Comment: (NOTE) The Xpert Xpress SARS-CoV-2/FLU/RSV plus assay is intended as an aid in the diagnosis of influenza from Nasopharyngeal swab specimens and should not be used as a sole basis for treatment. Nasal washings and aspirates are unacceptable for Xpert Xpress SARS-CoV-2/FLU/RSV testing.  Fact Sheet for Patients: EntrepreneurPulse.com.au  Fact Sheet for Healthcare Providers: IncredibleEmployment.be  This test is not yet approved or cleared by the [place] FDA and has been authorized for detection and/or diagnosis of SARS-CoV-2 by FDA under an Emergency Use Authorization (EUA). This EUA will remain in effect (meaning this test can be used) for the duration of the COVID-19 declaration under Section 564(b)(1) of the Act, 21 U.S.C. section 360bbb-3(b)(1), unless the authorization is terminated or revoked.  Performed at [facility], [address] [place], [place] [address]   MRSA Next Gen by PCR, Nasal     Status: None   Collection Time: 03/13/21  9:07 AM   Specimen: Nasal Mucosa; Nasal Swab  Result Value Ref Range Status   MRSA by PCR Next Gen NOT DETECTED NOT DETECTED Final    Comment:  (NOTE) The GeneXpert MRSA Assay (FDA approved for NASAL specimens only), is one component of a comprehensive MRSA colonization surveillance program. It is not intended to diagnose MRSA infection nor to guide or monitor treatment for MRSA infections. Test performance is not FDA approved in patients less than 36 years old. Performed at [facility], [address] [place], [place] [address]     Coagulation Studies: Recent Labs    03/16/21 0656  LABPROT 13.5  INR 1.0     Urinalysis: No results for input(s): COLORURINE, LABSPEC, PHURINE, GLUCOSEU, HGBUR, BILIRUBINUR, KETONESUR, PROTEINUR, UROBILINOGEN, NITRITE, LEUKOCYTESUR in the last 72 hours.  Invalid input(s): APPERANCEUR    Imaging: No results found.   Medications:     alteplase       aspirin EC  81 mg Oral Daily   carvedilol  12.5 mg Oral QPM   Chlorhexidine Gluconate Cloth  6 each Topical Daily   heparin  5,000 Units Subcutaneous Q8H   heparin sodium (porcine)       insulin aspart  0-5 Units Subcutaneous QHS   insulin aspart  0-6 Units Subcutaneous QAC breakfast   lactulose  15 g Oral Daily   lidocaine  1 patch Transdermal Q24H   midodrine  5 mg Oral TID WC   polyethylene glycol  17 g Oral Daily   senna-docusate  1 tablet Oral QHS   traZODone  50 mg Oral QHS   venlafaxine  37.5 mg Oral BID WC   guaiFENesin, morphine injection, ondansetron (ZOFRAN) IV, oxyCODONE, topiramate  Assessment/ Plan:  Ms. Cameran Ahmed is a 65 y.o.  female with past medical history including heart failure with preserved EF, chronic back pain chronic hypertension, and end-stage renal disease on hemodialysis. She has been admitted for ESRD (end stage renal disease) ([place]) [N18.6] Frequent falls [R29.6] Recurrent falls [R29.6] Syncope, unspecified syncope type [R55]  [facility]/TTS/Rt Permcath/84 kg  #End-stage renal disease on hemodialysis.   Received dialysis today, tolerated well.  Due to access concerns  during previous treatments, placed Cathflo with 1 hour dwell.  Also gave heparin bolus 2000 units at initiation of dialysis treatment.  Patient currently dialyzing well on 400 blood flow rate and no clotting concerns.  Outpatient dialysis treatment arranged at [facility] clinic, patient can begin treatment on Tuesday.   #Anemia of chronic kidney disease Lab Results  Component Value Date   HGB 9.9 (L) 03/16/2021  Hgb remains at goal.  No need for ESA's at this time   #Secondary Hyperparathyroidism: Lab Results  Component Value Date   PTH 70 (H) 05/12/2020   CALCIUM 8.7 (L) 03/16/2021   PHOS 2.7 06/27/2020  Will continue monitoring bone mineral metabolism parameters Will obtain updated phosphorus in am  #Chronic diastolic heart failure: ECHO from 03/07/21 shows EF of 60-65%. No acute respiratory distress noted  #Diabetes with CKD Lab Results  Component Value Date   HGBA1C 5.1 03/07/2021  Stable  Continue management per primary team  # Orthostatic hypotension, changed to Carvedilol 12.5mg  daily. Will order Midodrine 5mg  three times a day, hold if systolic BP greater than 384. BP 112/57 while dialyzing   LOS: 9 Jeray Shugart 11/4/202212:14 PM

## 2021-03-17 NOTE — Progress Notes (Signed)
Pt requested PRN pain medicine due to pain related to surgical site of 5, medicine effective.  Louanne Skye 12:28 AM 03/17/21

## 2021-03-17 NOTE — Progress Notes (Signed)
PROGRESS NOTE    Katie [name]  VPX:[id number] DOB: [birth year] DOA: 03/07/2021 PCP: Patient, [name] (Inactive)    Brief Narrative:  Katie [name] is a 65 y.o. female with medical history significant for hypertension, end-stage renal disease on hemodialysis [facility], heart failure preserved ejection fraction, chronic low back pain, presents emergency department for chief concerns of frequent falls and syncope after dialysis.. Found to be hypotensive.  She does have orthostatic hypotension.  Her BP meds were changed and started on midodrine today. PT recommended SNF. Left upper extremity axillary dialysis graft created 11/3.  No complications.  Vascular surgery signed off.  Patient will follow-up in vascular office in 3 weeks to assess graft maturation.  In the meantime we will dialyze via PermCath.   Assessment & Plan:   Active Problems:   Hypertension   Type 2 diabetes mellitus (HCC)   Chronic kidney disease (CKD), stage IV (severe) (HCC)   Pulmonary hypertension, unspecified ([place])   ESRD on dialysis ([place])   Frequent falls   Homelessness   Financial difficulty  Increased fall and weakness Likely secondary to postdialysis treatment and hypotension B12, B1, vitamin D and procalcitonin was ordered for work-up Echo nml EF.  Fall precautions 11/1 PT recommending SNF Outpatient HD changed to [facility] dose decreased Midodrine added Continue therapy evaluations and fall precautions  End-stage renal disease on hemodialysis Nephrology following for inpatient HD needs Outpatient HD chair found in [facility] Left upper extremity axillary graft created 11/3 Plan: Nephrology following for inpatient HD needs Stable for DC from nephrology and vascular standpoint   Constipation Continue current bowel regimen Monitor for BM Lactulose added, if fails will need enema  Hypertension Orthostatic hypotension Coreg dose decreased Midodrine 5 3 times daily SCDs Avoid  hypotension  Depression/anxiety continue venlafaxine   Insomnia trazodone 50 mg nightly    Headaches topiramate 50 mg p.o. twice daily as needed for headaches   History of diabetes type 2 presumed improved with end-stage renal disease BG stable Continue RISS   DVT prophylaxis: SQ heparin Code Status: Full Family Communication: None today Disposition Plan: Status is: Inpatient  Remains inpatient appropriate because: Unsafe discharge plan.  Patient is medically ready for discharge and can discharge to skilled nursing facility once insurance authorization is obtained.      Level of care: Med-Surg  Consultants:  Nephrology Vascular surgery  Procedures:  AV fistula creation planned 11/3  Antimicrobials: None   Subjective: Patient seen and examined.  Resting comfortably in bed.  No visible distress.  No pain complaints.  Tolerating p.o.  Tolerating dialysis.  Objective: Vitals:   03/17/21 1230 03/17/21 1245 03/17/21 1300 03/17/21 1315  BP: (!) 117/51 (!) 123/55 (!) 135/56 (!) 137/56  Pulse:      Resp: 15 16 12 19   Temp:      TempSrc:      SpO2:      Weight:      Height:        Intake/Output Summary (Last 24 hours) at 03/17/2021 1410 Last data filed at 03/17/2021 1315 Gross per 24 hour  Intake 600 ml  Output 421 ml  Net 179 ml   Filed Weights   03/15/21 0905 03/15/21 1146 03/16/21 1206  Weight: 81.5 kg 81.4 kg 81.4 kg    Examination:  General exam: No apparent distress Respiratory system: Clear lungs.  Normal work of breathing.  Room air Cardiovascular system: S1-S2, RRR, no murmurs, no pedal edema Gastrointestinal system: Soft, NT/ND, normal bowel sounds Central nervous  system: Alert and oriented. No focal neurological deficits. Extremities: Left upper extremity AV graft Skin: No rashes, lesions or ulcers Psychiatry: Judgement and insight appear normal. Mood & affect appropriate.     Data Reviewed: I have personally reviewed following labs and  imaging studies  CBC: Recent Labs  Lab 03/14/21 1309 03/16/21 0656  WBC 5.6 6.2  HGB 9.8* 9.9*  HCT 31.0* 31.5*  MCV 98.1 100.6*  PLT 257 841   Basic Metabolic Panel: Recent Labs  Lab 03/16/21 0656  NA 136  K 4.0  CL 98  CO2 27  GLUCOSE 87  BUN 38*  CREATININE 5.26*  CALCIUM 8.7*  MG 1.8   GFR: Estimated Creatinine Clearance: 11.9 mL/min (A) (by C-G formula based on SCr of 5.26 mg/dL (H)). Liver Function Tests: No results for input(s): AST, ALT, ALKPHOS, BILITOT, PROT, ALBUMIN in the last 168 hours. No results for input(s): LIPASE, AMYLASE in the last 168 hours. No results for input(s): AMMONIA in the last 168 hours. Coagulation Profile: Recent Labs  Lab 03/16/21 0656  INR 1.0   Cardiac Enzymes: No results for input(s): CKTOTAL, CKMB, CKMBINDEX, TROPONINI in the last 168 hours. BNP (last 3 results) No results for input(s): PROBNP in the last 8760 hours. HbA1C: No results for input(s): HGBA1C in the last 72 hours. CBG: Recent Labs  Lab 03/16/21 0919 03/16/21 1145 03/16/21 1442 03/16/21 1648 03/17/21 0730  GLUCAP 97 99 90 80 96   Lipid Profile: No results for input(s): CHOL, HDL, LDLCALC, TRIG, CHOLHDL, LDLDIRECT in the last 72 hours. Thyroid Function Tests: No results for input(s): TSH, T4TOTAL, FREET4, T3FREE, THYROIDAB in the last 72 hours. Anemia Panel: No results for input(s): VITAMINB12, FOLATE, FERRITIN, TIBC, IRON, RETICCTPCT in the last 72 hours. Sepsis Labs: No results for input(s): PROCALCITON, LATICACIDVEN in the last 168 hours.  Recent Results (from the past 240 hour(s))  MRSA Next Gen by PCR, Nasal     Status: None   Collection Time: 03/13/21  9:07 AM   Specimen: Nasal Mucosa; Nasal Swab  Result Value Ref Range Status   MRSA by PCR Next Gen NOT DETECTED NOT DETECTED Final    Comment: (NOTE) The GeneXpert MRSA Assay (FDA approved for NASAL specimens only), is one component of a comprehensive MRSA colonization surveillance program. It  is not intended to diagnose MRSA infection nor to guide or monitor treatment for MRSA infections. Test performance is not FDA approved in patients less than 62 years old. Performed at [facility], [address] [place], [place] [address]          Radiology Studies: No results found.      Scheduled Meds:  aspirin EC  81 mg Oral Daily   carvedilol  12.5 mg Oral QPM   Chlorhexidine Gluconate Cloth  6 each Topical Daily   heparin  5,000 Units Subcutaneous Q8H   heparin sodium (porcine)       insulin aspart  0-5 Units Subcutaneous QHS   insulin aspart  0-6 Units Subcutaneous QAC breakfast   lactulose  15 g Oral Daily   lidocaine  1 patch Transdermal Q24H   midodrine  5 mg Oral TID WC   polyethylene glycol  17 g Oral Daily   senna-docusate  1 tablet Oral QHS   traZODone  50 mg Oral QHS   venlafaxine  37.5 mg Oral BID WC   Continuous Infusions:   LOS: 9 days    Time spent: 25 minutes    Sidney Ace, MD  [facility]   If 7PM-7AM, please contact night-coverage  03/17/2021, 2:10 PM

## 2021-03-17 NOTE — Progress Notes (Addendum)
Katie [name] Daily Progress Note  [name]: Left upper arm brachial artery to axillary vein arteriovenous graft  Subjective: Patient is experiencing left upper extremity incisional discomfort.  No acute issues overnight.  Tolerated dialysis today.  Objective: Vitals:   03/17/21 1015 03/17/21 1030 03/17/21 1045 03/17/21 1100  BP: (!) 106/53 (!) 73/43 (!) 100/51 (!) 98/52  Pulse:      Resp: 12 12 14 14   Temp:      TempSrc:      SpO2:      Weight:      Height:        Intake/Output Summary (Last 24 hours) at 03/17/2021 1116 Last data filed at 03/17/2021 4599 Gross per 24 hour  Intake 700 ml  Output 6 ml  Net 694 ml   Physical Exam: A&Ox3, NAD CV: RRR Pulmonary: CTA Bilaterally Abdomen: Soft, Nontender, Nondistended Vascular:  Left upper extremity: Both incision sites with Dermabond intact clean and dry.  Minimal swelling to the extremity.  Hand is warm.  Palpable radial pulse.  Motor/sensory is intact.  Good bruit and thrill in graft.   Laboratory: CBC    Component Value Date/Time   WBC 6.2 03/16/2021 0656   HGB 9.9 (L) 03/16/2021 0656   HCT 31.5 (L) 03/16/2021 0656   PLT 250 03/16/2021 0656   BMET    Component Value Date/Time   NA 136 03/16/2021 0656   K 4.0 03/16/2021 0656   CL 98 03/16/2021 0656   CO2 27 03/16/2021 0656   GLUCOSE 87 03/16/2021 0656   BUN 38 (H) 03/16/2021 0656   CREATININE 5.26 (H) 03/16/2021 0656   CALCIUM 8.7 (L) 03/16/2021 0656   GFRNONAA 9 (L) 03/16/2021 0656   GFRAA  01/19/2021 0833    SPECIMEN HEMOLYZED. HEMOLYSIS MAY AFFECT INTEGRITY OF RESULTS.   Assessment/Planning: The patient is a 65 year old female with known history of chronic kidney disease which is now progressed to end-stage renal disease currently being maintained via hemodialysis s/p left brachial axillary graft creation - [name]  1) successful creation of a left brachial axillary graft 2) we will see the patient back in approximately 3 weeks to  assess its maturation.  Until her graft is fully healed she should continue to dialyze through her PermCath. 3) discharge follow-up has been entered. 4) vascular [name] to sign off at this time.  Discussed with Dr. Ellis Parents [name] PA-C 03/17/2021 11:16 AM

## 2021-03-17 NOTE — Progress Notes (Signed)
Physical Therapy Treatment Patient Details Name: Katie [name] MRN: [id number] DOB: [birth year] Today's Date: 03/17/2021   History of Present Illness Pt is a 65 y.o. female with medical history significant for hypertension, DM, end-stage renal disease on hemodialysis TuThSa, heart failure preserved ejection fraction, chronic low back pain, presents emergency department for chief concerns of frequent falls and syncope after dialysis. Pt struggles with homelessness and finanacial difficulty. MD assessment includes: increased falls and weakness, depression/anxiety, insominia, and HAs.    PT Comments    Entered patient's room and pt agreeable to PT treatment. Pt able to complete bed mobility with SPV + verbal cues and pt guarding L UE throughout due to pain. Pt able to perform sit to stand with R handheld assistance and demonstrates some posterior lean but able to correct with CGA. Pt able to take side steps at EOB and to the recliner chair but declining any further ambulation due to fatigue. Pt completed series of seated exercises to maintain LE strength bilaterally. Pt left seated in chair with all needs within reach. Will continue to work with patient to improve mobility, reduce fall risk, and improve activity tolerance.    Recommendations for follow up therapy are one component of a multi-disciplinary discharge planning process, led by the attending physician.  Recommendations may be updated based on patient status, additional functional criteria and insurance authorization.  Follow Up Recommendations  Skilled nursing-short term rehab (<3 hours/day)     Assistance Recommended at Discharge Intermittent Supervision/Assistance  Equipment Recommendations  Other (comment) (TBD next venue of care)    Recommendations for Other Services       Precautions / Restrictions Precautions Precautions: Fall Restrictions Weight Bearing Restrictions: No     Mobility  Bed Mobility Overal bed  mobility Bed Mobility: Supine to Sit     Supine to sit: Supervision     General bed mobility comments: SPV and verbal cues for completing bed mobility. Pt guarding L UE throughout    Transfers Overall transfer level: Needs assistance Equipment used: 1 person hand held assist Transfers: Sit to/from Stand Sit to Stand: Min assist           General transfer comment: MinA for standing at EOB with 1 UE assist due to increased pain in L UE. Pt demonstrating slight posterior lean in standing using the back of her legs on the bed    Ambulation/Gait             General Gait Details: Side stepping at EOB and into the recliner chair with R hand held assist. Pt demonstrating posterior lean but able to correct with CGA. Pt endorsing increased fatigue after HD today and declining any further ambulation   Stairs             Wheelchair Mobility    Modified Rankin (Stroke Patients Only)       Balance Overall balance assessment: Needs assistance Sitting-balance support: Feet supported;No upper extremity supported Sitting balance-Leahy Scale: Good     Standing balance support: Single extremity supported;During functional activity Standing balance-Leahy Scale: Fair Standing balance comment: Fair for static and dynamic standing balance with 1 handheld assistance. Demonstrates moments of posterior lean but able to correct with CGA                            Cognition Arousal/Alertness: Awake/alert Behavior During Therapy: WFL for tasks assessed/performed Overall Cognitive Status: Within Functional Limits for tasks assessed  Exercises Total Joint Exercises Ankle Circles/Pumps: AROM;Strengthening;Both;20 reps;Seated Gluteal Sets: Strengthening;Both;20 reps;Seated Towel Squeeze: AROM;Strengthening;Both;10 reps;Seated Long Arc Quad: AROM;Strengthening;Both;20 reps;Seated General Exercises - Lower  Extremity Hip Flexion/Marching: AROM;Strengthening;Both;20 reps;Seated    General Comments        Pertinent Vitals/Pain Pain Assessment: No/denies pain    Home Living                          Prior Function            PT Goals (current goals can now be found in the care plan section) Acute Rehab PT Goals Patient Stated Goal: To get back to walking again PT Goal Formulation: With patient Time For Goal Achievement: 03/20/21 Potential to Achieve Goals: Good Progress towards PT goals: Progressing toward goals    Frequency    Min 2X/week      PT Plan Current plan remains appropriate    Co-evaluation              AM-PAC PT "6 Clicks" Mobility   Outcome Measure  Help needed turning from your back to your side while in a flat bed without using bedrails?: A Little Help needed moving from lying on your back to sitting on the side of a flat bed without using bedrails?: A Little Help needed moving to and from a bed to a chair (including a wheelchair)?: A Little Help needed standing up from a chair using your arms (e.g., wheelchair or bedside chair)?: A Little Help needed to walk in hospital room?: A Lot Help needed climbing 3-5 steps with a railing? : Total 6 Click Score: 15    End of Session Equipment Utilized During Treatment: Gait belt Activity Tolerance: Patient limited by fatigue;Patient tolerated treatment well Patient left: in chair;with call bell/phone within reach;with chair alarm set Nurse Communication: Mobility status PT Visit Diagnosis: Unsteadiness on feet (R26.81);History of falling (Z91.81);Muscle weakness (generalized) (M62.81);Difficulty in walking, not elsewhere classified (R26.2);Pain Pain - Right/Left: Left Pain - part of body: Shoulder     Time: 1025-8527 PT Time Calculation (min) (ACUTE ONLY): 15 min  Charges:  $Therapeutic Activity: 8-22 mins                     Andrey Campanile, SPT    Andrey Campanile 03/17/2021,  3:54 PM

## 2021-03-17 NOTE — TOC Progression Note (Addendum)
Transition of Care [facility]) - Progression Note    Patient Details  Name: Katie [name] MRN: [id number] Date of Birth: 08-09-1955  Transition [facility] Care [facility]) CM/SW Contact  Beverly Sessions, RN Phone Number: 03/17/2021, 2:09 PM  Clinical Narrative:     Message left for [facility] for [facility] [phone] to follow up about authorization  Per [name] Patient accepted at [facility] TTS 12:40am  Expected Discharge Plan: [place] Barriers to Discharge: Continued Medical Work up  Expected Discharge Plan and Services Expected Discharge Plan: [place] arrangements for the past 2 months: Single Family Home                                       Social Determinants of Health (SDOH) Interventions    Readmission Risk Interventions Readmission Risk Prevention Plan 03/08/2021  Transportation Screening Complete  Medication Review Press photographer) Complete  HRI or [place] Complete

## 2021-03-17 NOTE — Plan of Care (Signed)
  Problem: Health Behavior/Discharge Planning: Goal: Ability to manage health-related needs will improve Outcome: Progressing   Problem: Clinical Measurements: Goal: Ability to maintain clinical measurements within normal limits will improve Outcome: Progressing Goal: Will remain free from infection Outcome: Progressing Goal: Diagnostic test results will improve Outcome: Progressing Goal: Respiratory complications will improve Outcome: Progressing Goal: Cardiovascular complication will be avoided Outcome: Progressing   Problem: Activity: Goal: Risk for activity intolerance will decrease Outcome: Progressing   Problem: Nutrition: Goal: Adequate nutrition will be maintained Outcome: Progressing   Problem: Coping: Goal: Level of anxiety will decrease Outcome: Progressing   Problem: Elimination: Goal: Will not experience complications related to bowel motility Outcome: Progressing Goal: Will not experience complications related to urinary retention Outcome: Progressing   Problem: Pain Managment: Goal: General experience of comfort will improve Outcome: Progressing   Problem: Safety: Goal: Ability to remain free from injury will improve Outcome: Progressing   Problem: Skin Integrity: Goal: Risk for impaired skin integrity will decrease Outcome: Progressing   Problem: Education: Goal: Individualized Educational Video(s) Outcome: Progressing   Problem: Metabolic: Goal: Ability to maintain appropriate glucose levels will improve Outcome: Progressing   Problem: Nutritional: Goal: Maintenance of adequate nutrition will improve Outcome: Progressing Goal: Progress toward achieving an optimal weight will improve Outcome: Progressing

## 2021-03-17 NOTE — Progress Notes (Signed)
OT Cancellation Note  Patient Details Name: Katie [name] MRN: [id number] DOB: 06-02-1955   Cancelled Treatment:    Reason Eval/Treat Not Completed: Patient at procedure or test/ unavailable (pt off floor, will re-attempt as able)  Shanon Payor, OTD OTR/L  03/17/21, 11:14 AM

## 2021-03-17 NOTE — Progress Notes (Signed)
Pt with 3 hr HD session, tolerated well, all efforts of heparinization and cath flo effective, no clotting of dialyzer. Left arm elevated during tx, no meds given. VSS. Patient returns to floor,

## 2021-03-17 NOTE — Discharge Instructions (Signed)
Vascular Surgery Discharge Instructions:  1) Please keep your incisions clean and dry.  Gently clean your incision with soap and water.  Gently pat dry. 2) The purple glue layer that is on top of your incision will slowly fall off on its own over the next week or 2.  Please do not try to remove it on your own.

## 2021-03-18 ENCOUNTER — Inpatient Hospital Stay: Payer: Medicaid Other

## 2021-03-18 LAB — GLUCOSE, CAPILLARY
Glucose-Capillary: 73 mg/dL (ref 70–99)
Glucose-Capillary: 103 mg/dL — ABNORMAL HIGH (ref 70–99)

## 2021-03-18 MED ORDER — SENNOSIDES-DOCUSATE SODIUM 8.6-50 MG PO TABS
1.0000 | ORAL_TABLET | Freq: Two times a day (BID) | ORAL | Status: DC
  Administered 2021-03-18 – 2021-03-20 (×5): 1 via ORAL
  Filled 2021-03-18 (×5): qty 1

## 2021-03-18 MED ORDER — BISACODYL 10 MG RE SUPP
10.0000 mg | Freq: Every day | RECTAL | Status: DC | PRN
  Administered 2021-03-18: 10 mg via RECTAL
  Filled 2021-03-18: qty 1

## 2021-03-18 NOTE — Progress Notes (Signed)
PROGRESS NOTE    Katie [name]  QBH:[id number] DOB: [birth year] DOA: 03/07/2021 PCP: Patient, [name] (Inactive)    Brief Narrative:  Katie [name] is a 65 y.o. female with medical history significant for hypertension, end-stage renal disease on hemodialysis TuThSa, heart failure preserved ejection fraction, chronic low back pain, presents emergency department for chief concerns of frequent falls and syncope after dialysis.. Found to be hypotensive.  She does have orthostatic hypotension.  Her BP meds were changed and started on midodrine today. PT recommended SNF. Left upper extremity axillary dialysis graft created 11/3.  No complications.  Vascular surgery signed off.  Patient will follow-up in vascular office in 3 weeks to assess graft maturation.  In the meantime we will dialyze via PermCath.   Assessment & Plan:   Active Problems:   Hypertension   Type 2 diabetes mellitus (HCC)   Chronic kidney disease (CKD), stage IV (severe) (HCC)   Pulmonary hypertension, unspecified ([place])   ESRD on dialysis ([place])   Frequent falls   Homelessness   Financial difficulty  Increased fall and weakness Likely secondary to postdialysis treatment and hypotension B12, B1, vitamin D and procalcitonin was ordered for work-up Echo nml EF.  Fall precautions 11/1 PT recommending SNF Outpatient HD changed to [facility] dose decreased Midodrine added Continue therapy evaluations and fall precautions  End-stage renal disease on hemodialysis Nephrology following for inpatient HD needs Outpatient HD chair found in [facility] Left upper extremity axillary graft created 11/3 Plan: Nephrology following for inpatient HD needs Stable for DC from nephrology and vascular standpoint Stable from medical standpoint as well.  Pending insurance authorization   Constipation Continue current bowel regimen Monitor for BM Lactulose added, if fails will need enema  Hypertension Orthostatic  hypotension Coreg dose decreased Midodrine 5 3 times daily SCDs Avoid hypotension  Depression/anxiety continue venlafaxine   Insomnia trazodone 50 mg nightly    Headaches topiramate 50 mg p.o. twice daily as needed for headaches   History of diabetes type 2 presumed improved with end-stage renal disease BG stable Continue RISS   DVT prophylaxis: SQ heparin Code Status: Full Family Communication: None today Disposition Plan: Status is: Inpatient  Remains inpatient appropriate because: Unsafe discharge plan.  Patient is medically ready for discharge and can discharge to skilled nursing facility once insurance authorization is obtained.      Level of care: Med-Surg  Consultants:  Nephrology Vascular surgery  Procedures:  AV fistula creation planned 11/3  Antimicrobials: None   Subjective: Patient seen and examined.  Resting in bed.  No visible distress.  No pain complaints.  Objective: Vitals:   03/17/21 1540 03/17/21 2128 03/18/21 0509 03/18/21 0755  BP: 136/61 (!) 155/67 134/62 (!) 153/63  Pulse: 82 83 86 80  Resp: 18 18 18 18   Temp: 98.6 F (37 C) 99.9 F (37.7 C) 99.4 F (37.4 C) 98.9 F (37.2 C)  TempSrc:  Oral Oral   SpO2: 100% 96% 95% 98%  Weight:      Height:        Intake/Output Summary (Last 24 hours) at 03/18/2021 1034 Last data filed at 03/17/2021 1315 Gross per 24 hour  Intake --  Output 415 ml  Net -415 ml   Filed Weights   03/15/21 0905 03/15/21 1146 03/16/21 1206  Weight: 81.5 kg 81.4 kg 81.4 kg    Examination:  General exam: No apparent distress Respiratory system: Clear lungs.  Normal work of breathing.  Room air Cardiovascular system: S1-S2, RRR, no murmurs,  no pedal edema Gastrointestinal system: Soft, NT/ND, normal bowel sounds Central nervous system: Alert and oriented. No focal neurological deficits. Extremities: Left upper extremity AV graft Skin: No rashes, lesions or ulcers Psychiatry: Judgement and insight  appear normal. Mood & affect appropriate.     Data Reviewed: I have personally reviewed following labs and imaging studies  CBC: Recent Labs  Lab 03/14/21 1309 03/16/21 0656  WBC 5.6 6.2  HGB 9.8* 9.9*  HCT 31.0* 31.5*  MCV 98.1 100.6*  PLT 257 564   Basic Metabolic Panel: Recent Labs  Lab 03/16/21 0656  NA 136  K 4.0  CL 98  CO2 27  GLUCOSE 87  BUN 38*  CREATININE 5.26*  CALCIUM 8.7*  MG 1.8   GFR: Estimated Creatinine Clearance: 11.9 mL/min (A) (by C-G formula based on SCr of 5.26 mg/dL (H)). Liver Function Tests: No results for input(s): AST, ALT, ALKPHOS, BILITOT, PROT, ALBUMIN in the last 168 hours. No results for input(s): LIPASE, AMYLASE in the last 168 hours. No results for input(s): AMMONIA in the last 168 hours. Coagulation Profile: Recent Labs  Lab 03/16/21 0656  INR 1.0   Cardiac Enzymes: No results for input(s): CKTOTAL, CKMB, CKMBINDEX, TROPONINI in the last 168 hours. BNP (last 3 results) No results for input(s): PROBNP in the last 8760 hours. HbA1C: No results for input(s): HGBA1C in the last 72 hours. CBG: Recent Labs  Lab 03/16/21 1442 03/16/21 1648 03/17/21 0730 03/17/21 2128 03/18/21 0756  GLUCAP 90 80 96 92 73   Lipid Profile: No results for input(s): CHOL, HDL, LDLCALC, TRIG, CHOLHDL, LDLDIRECT in the last 72 hours. Thyroid Function Tests: No results for input(s): TSH, T4TOTAL, FREET4, T3FREE, THYROIDAB in the last 72 hours. Anemia Panel: No results for input(s): VITAMINB12, FOLATE, FERRITIN, TIBC, IRON, RETICCTPCT in the last 72 hours. Sepsis Labs: No results for input(s): PROCALCITON, LATICACIDVEN in the last 168 hours.  Recent Results (from the past 240 hour(s))  MRSA Next Gen by PCR, Nasal     Status: None   Collection Time: 03/13/21  9:07 AM   Specimen: Nasal Mucosa; Nasal Swab  Result Value Ref Range Status   MRSA by PCR Next Gen NOT DETECTED NOT DETECTED Final    Comment: (NOTE) The GeneXpert MRSA Assay (FDA  approved for NASAL specimens only), is one component of a comprehensive MRSA colonization surveillance program. It is not intended to diagnose MRSA infection nor to guide or monitor treatment for MRSA infections. Test performance is not FDA approved in patients less than 59 years old. Performed at [facility], [address] [place], [place] [address]          Radiology Studies: No results found.      Scheduled Meds:  aspirin EC  81 mg Oral Daily   carvedilol  12.5 mg Oral QPM   Chlorhexidine Gluconate Cloth  6 each Topical Daily   heparin  5,000 Units Subcutaneous Q8H   insulin aspart  0-5 Units Subcutaneous QHS   insulin aspart  0-6 Units Subcutaneous QAC breakfast   lactulose  15 g Oral Daily   lidocaine  1 patch Transdermal Q24H   midodrine  5 mg Oral TID WC   polyethylene glycol  17 g Oral Daily   senna-docusate  1 tablet Oral QHS   traZODone  50 mg Oral QHS   venlafaxine  37.5 mg Oral BID WC   Continuous Infusions:   LOS: 10 days    Time spent: 15 minutes    Brighton Delio  Sloan Leiter, MD [facility]   If 7PM-7AM, please contact night-coverage  03/18/2021, 10:34 AM

## 2021-03-18 NOTE — Progress Notes (Signed)
[facility]  ROUNDING NOTE   Subjective:   Katie [name] is a 65 year old female with past medical history including heart failure with preserved EF, chronic back pain chronic hypertension, and end-stage renal disease on hemodialysis. She has been admitted for ESRD (end stage renal disease) ([place]) [N18.6] Frequent falls [R29.6] Recurrent falls [R29.6] Syncope, unspecified syncope type [R55]  Patient seen on second floor. Her main comment was " I feel better than yesterday ". Patient offers no new specific physical complaints   Objective:  Vital signs in last 24 hours:  Temp:  [98.6 F (37 C)-99.9 F (37.7 C)] 98.9 F (37.2 C) (11/05 0755) Pulse Rate:  [80-86] 80 (11/05 0755) Resp:  [18] 18 (11/05 0755) BP: (134-155)/(61-67) 153/63 (11/05 0755) SpO2:  [95 %-100 %] 98 % (11/05 0755)  Weight change:  Filed Weights   03/15/21 0905 03/15/21 1146 03/16/21 1206  Weight: 81.5 kg 81.4 kg 81.4 kg    Intake/Output: I/O last 3 completed shifts: In: 200 [P.O.:200] Out: 416 [Urine:1; Other:415]   Intake/Output this shift:  No intake/output data recorded.  Physical Exam: General: Resting in bed, in no acute distress  Head: Moist oral mucosal membranes  Lungs:  Respirations even,unlabored   Heart: S1S2, no rubs or gallops  Abdomen:  Soft, nontender,non distended  Extremities:  No peripheral edema. LUE erythema, edema  Neurologic: Awake,alert,oriented  Skin: No acute  lesions or rashes  Access: Rt chest Permcath, LUE AVG placed on 01/18/27    Basic Metabolic Panel: Recent Labs  Lab 03/16/21 0656  NA 136  K 4.0  CL 98  CO2 27  GLUCOSE 87  BUN 38*  CREATININE 5.26*  CALCIUM 8.7*  MG 1.8    Liver Function Tests: No results for input(s): AST, ALT, ALKPHOS, BILITOT, PROT, ALBUMIN in the last 168 hours.  No results for input(s): LIPASE, AMYLASE in the last 168 hours. No results for input(s): AMMONIA in the last 168 hours.  CBC: Recent Labs  Lab  03/14/21 1309 03/16/21 0656  WBC 5.6 6.2  HGB 9.8* 9.9*  HCT 31.0* 31.5*  MCV 98.1 100.6*  PLT 257 250    Cardiac Enzymes: No results for input(s): CKTOTAL, CKMB, CKMBINDEX, TROPONINI in the last 168 hours.  BNP: Invalid input(s): POCBNP  CBG: Recent Labs  Lab 03/16/21 1442 03/16/21 1648 03/17/21 0730 03/17/21 2128 03/18/21 0756  GLUCAP 90 80 96 92 73    Microbiology: Results for orders placed or performed during the hospital encounter of 03/07/21  Resp Panel by RT-PCR (Flu A&B, Covid) Nasopharyngeal Swab     Status: None   Collection Time: 03/07/21 12:55 PM   Specimen: Nasopharyngeal Swab; Nasopharyngeal(NP) swabs in vial transport medium  Result Value Ref Range Status   SARS Coronavirus 2 by RT PCR NEGATIVE NEGATIVE Final    Comment: (NOTE) SARS-CoV-2 target nucleic acids are NOT DETECTED.  The SARS-CoV-2 RNA is generally detectable in upper respiratory specimens during the acute phase of infection. The lowest concentration of SARS-CoV-2 viral copies this assay can detect is 138 copies/mL. A negative result does not preclude SARS-Cov-2 infection and should not be used as the sole basis for treatment or other patient management decisions. A negative result may occur with  improper specimen collection/handling, submission of specimen other than nasopharyngeal swab, presence of viral mutation(s) within the areas targeted by this assay, and inadequate number of viral copies(<138 copies/mL). A negative result must be combined with clinical observations, patient history, and epidemiological information. The expected result is Negative.  Fact Sheet for Patients:  EntrepreneurPulse.com.au  Fact Sheet for Healthcare Providers:  IncredibleEmployment.be  This test is no t yet approved or cleared by the [place] FDA and  has been authorized for detection and/or diagnosis of SARS-CoV-2 by FDA under an Emergency Use Authorization  (EUA). This EUA will remain  in effect (meaning this test can be used) for the duration of the COVID-19 declaration under Section 564(b)(1) of the Act, 21 U.S.C.section 360bbb-3(b)(1), unless the authorization is terminated  or revoked sooner.       Influenza A by PCR NEGATIVE NEGATIVE Final   Influenza B by PCR NEGATIVE NEGATIVE Final    Comment: (NOTE) The Xpert Xpress SARS-CoV-2/FLU/RSV plus assay is intended as an aid in the diagnosis of influenza from Nasopharyngeal swab specimens and should not be used as a sole basis for treatment. Nasal washings and aspirates are unacceptable for Xpert Xpress SARS-CoV-2/FLU/RSV testing.  Fact Sheet for Patients: EntrepreneurPulse.com.au  Fact Sheet for Healthcare Providers: IncredibleEmployment.be  This test is not yet approved or cleared by the [place] FDA and has been authorized for detection and/or diagnosis of SARS-CoV-2 by FDA under an Emergency Use Authorization (EUA). This EUA will remain in effect (meaning this test can be used) for the duration of the COVID-19 declaration under Section 564(b)(1) of the Act, 21 U.S.C. section 360bbb-3(b)(1), unless the authorization is terminated or revoked.  Performed at [facility], [address] [place], [place] [address]   MRSA Next Gen by PCR, Nasal     Status: None   Collection Time: 03/13/21  9:07 AM   Specimen: Nasal Mucosa; Nasal Swab  Result Value Ref Range Status   MRSA by PCR Next Gen NOT DETECTED NOT DETECTED Final    Comment: (NOTE) The GeneXpert MRSA Assay (FDA approved for NASAL specimens only), is one component of a comprehensive MRSA colonization surveillance program. It is not intended to diagnose MRSA infection nor to guide or monitor treatment for MRSA infections. Test performance is not FDA approved in patients less than 66 years old. Performed at [facility], [address] [place],   [address]     Coagulation Studies: Recent Labs    03/16/21 0656  LABPROT 13.5  INR 1.0    Urinalysis: No results for input(s): COLORURINE, LABSPEC, PHURINE, GLUCOSEU, HGBUR, BILIRUBINUR, KETONESUR, PROTEINUR, UROBILINOGEN, NITRITE, LEUKOCYTESUR in the last 72 hours.  Invalid input(s): APPERANCEUR    Imaging: No results found.   Medications:     aspirin EC  81 mg Oral Daily   carvedilol  12.5 mg Oral QPM   Chlorhexidine Gluconate Cloth  6 each Topical Daily   heparin  5,000 Units Subcutaneous Q8H   insulin aspart  0-5 Units Subcutaneous QHS   insulin aspart  0-6 Units Subcutaneous QAC breakfast   lactulose  15 g Oral Daily   lidocaine  1 patch Transdermal Q24H   midodrine  5 mg Oral TID WC   polyethylene glycol  17 g Oral Daily   senna-docusate  1 tablet Oral QHS   traZODone  50 mg Oral QHS   venlafaxine  37.5 mg Oral BID WC   guaiFENesin, morphine injection, ondansetron (ZOFRAN) IV, oxyCODONE, topiramate  Assessment/ Plan:  [name] is a 65 y.o.  female with past medical history including heart failure with preserved EF, chronic back pain chronic hypertension, and end-stage renal disease on hemodialysis. She has been admitted for ESRD (end stage renal disease) ([place]) [N18.6] Frequent falls [R29.6] Recurrent falls [R29.6] Syncope,  unspecified syncope type [R55]  [facility]/TTS/Rt Permcath/84 kg  1) Renal Patient has end-stage renal disease. Patient is on hemodialysis.   Patient did receive dialysis yesterday Patient's outpatient dialysis treatment arranged at [facility] clinic, patient can begin treatment on Tuesday.   2)Anemia of chronic kidney disease Lab Results  Component Value Date   HGB 9.9 (L) 03/16/2021  Hgb remains at goal.  No need for ESA's at this time   3)Secondary Hyperparathyroidism: Lab Results  Component Value Date   PTH 70 (H) 05/12/2020   CALCIUM 8.7 (L) 03/16/2021   PHOS 2.7 06/27/2020  Will continue  monitoring bone mineral metabolism parameters Will obtain updated phosphorus in am  4)Chronic diastolic heart failure: ECHO from 03/07/21 shows EF of 60-65%. No acute respiratory distress noted  5)Diabetes with CKD Lab Results  Component Value Date   HGBA1C 5.1 03/07/2021  Stable  Continue management per primary team   6)  Orthostatic hypotension, changed to Carvedilol 12.5mg  daily.  Now on  Midodrine 5mg  three times a day.  Plan  No need for renal placement therapy today.    LOS: 10 Symir Mah s Nimra Puccinelli 11/5/20221:18 PM

## 2021-03-18 NOTE — TOC Progression Note (Addendum)
Transition of Care [facility]) - Progression Note    Patient Details  Name: Katie [name] MRN: [id number] Date of Birth: 1955-10-01  Transition of Care [facility]) CM/SW [place], LCSW Phone Number: 03/18/2021, 9:40 AM  Clinical Narrative:   Left VM for [place] with [facility]. Requested update on insurance auth status.    Expected Discharge Plan: [place] Barriers to Discharge: Continued Medical Work up  Expected Discharge Plan and Services Expected Discharge Plan: [place] arrangements for the past 2 months: Single Family Home                                       Social Determinants of Health (SDOH) Interventions    Readmission Risk Interventions Readmission Risk Prevention Plan 03/08/2021  Transportation Screening Complete  Medication Review Press photographer) Complete  HRI or [place] Complete

## 2021-03-19 LAB — GLUCOSE, CAPILLARY: Glucose-Capillary: 91 mg/dL (ref 70–99)

## 2021-03-19 NOTE — Progress Notes (Signed)
PROGRESS NOTE    Katie [name]  XTA:[id number] DOB: [birth year] DOA: 03/07/2021 PCP: Patient, [name] (Inactive)    Brief Narrative:  Katie [name] is a 65 y.o. female with medical history significant for hypertension, end-stage renal disease on hemodialysis [facility], heart failure preserved ejection fraction, chronic low back pain, presents emergency department for chief concerns of frequent falls and syncope after dialysis.. Found to be hypotensive.  She does have orthostatic hypotension.  Her BP meds were changed and started on midodrine today. PT recommended SNF. Left upper extremity axillary dialysis graft created 11/3.  No complications.  Vascular surgery signed off.  Patient will follow-up in vascular office in 3 weeks to assess graft maturation.  In the meantime we will dialyze via PermCath.  Patient medically stable for discharge.  Pending insurance authorization and placement at skilled nursing facility   Assessment & Plan:   Active Problems:   Hypertension   Type 2 diabetes mellitus (HCC)   Chronic kidney disease (CKD), stage IV (severe) (HCC)   Pulmonary hypertension, unspecified ([place])   ESRD on dialysis ([place])   Frequent falls   Homelessness   Financial difficulty  Increased fall and weakness Likely secondary to postdialysis treatment and hypotension B12, B1, vitamin D and procalcitonin was ordered for work-up Echo nml EF.  Fall precautions 11/1 PT recommending SNF Outpatient HD changed to [facility] dose decreased Midodrine added Continue therapy evaluations and fall precautions  End-stage renal disease on hemodialysis Nephrology following for inpatient HD needs Outpatient HD chair found in [facility] Left upper extremity axillary graft created 11/3 Plan: Nephrology following for inpatient HD needs Stable for DC from nephrology and vascular standpoint Stable from medical standpoint as well.  Pending insurance authorization    Constipation Continue current bowel regimen Patient had BM reported 11/5  Hypertension Orthostatic hypotension Coreg dose decreased Midodrine 5 3 times daily SCDs Avoid hypotension  Depression/anxiety continue venlafaxine   Insomnia trazodone 50 mg nightly    Headaches topiramate 50 mg p.o. twice daily as needed for headaches   History of diabetes type 2 presumed improved with end-stage renal disease BG stable Continue RISS   DVT prophylaxis: SQ heparin Code Status: Full Family Communication: None today Disposition Plan: Status is: Inpatient  Remains inpatient appropriate because: Unsafe discharge plan.  Patient is medically ready for discharge and can discharge to skilled nursing facility once insurance authorization is obtained.      Level of care: Med-Surg  Consultants:  Nephrology Vascular surgery  Procedures:  AV fistula creation planned 11/3  Antimicrobials: None   Subjective: Patient seen and examined.  Resting in bed.  No visible distress.  No pain complaints.  Objective: Vitals:   03/18/21 1542 03/18/21 2237 03/19/21 0504 03/19/21 0721  BP: (!) 158/60 (!) 139/56 (!) 133/55 131/62  Pulse: 73 74 72 69  Resp: 18 18 16 12   Temp: 98.2 F (36.8 C) 98.6 F (37 C) 98.9 F (37.2 C) 98.6 F (37 C)  TempSrc: Oral Oral Oral Oral  SpO2: 95% 96% 96% 94%  Weight:      Height:        Intake/Output Summary (Last 24 hours) at 03/19/2021 1153 Last data filed at 03/18/2021 1445 Gross per 24 hour  Intake 120 ml  Output --  Net 120 ml   Filed Weights   03/15/21 0905 03/15/21 1146 03/16/21 1206  Weight: 81.5 kg 81.4 kg 81.4 kg    Examination:  General exam: No apparent distress Respiratory system: Clear lungs.  Normal  work of breathing.  Room air Cardiovascular system: S1-S2, RRR, no murmurs, no pedal edema Gastrointestinal system: Soft, NT/ND, normal bowel sounds Central nervous system: Alert and oriented. No focal neurological  deficits. Extremities: Left upper extremity AV graft Skin: No rashes, lesions or ulcers Psychiatry: Judgement and insight appear normal. Mood & affect appropriate.     Data Reviewed: I have personally reviewed following labs and imaging studies  CBC: Recent Labs  Lab 03/14/21 1309 03/16/21 0656  WBC 5.6 6.2  HGB 9.8* 9.9*  HCT 31.0* 31.5*  MCV 98.1 100.6*  PLT 257 952   Basic Metabolic Panel: Recent Labs  Lab 03/16/21 0656  NA 136  K 4.0  CL 98  CO2 27  GLUCOSE 87  BUN 38*  CREATININE 5.26*  CALCIUM 8.7*  MG 1.8   GFR: Estimated Creatinine Clearance: 11.9 mL/min (A) (by C-G formula based on SCr of 5.26 mg/dL (H)). Liver Function Tests: No results for input(s): AST, ALT, ALKPHOS, BILITOT, PROT, ALBUMIN in the last 168 hours. No results for input(s): LIPASE, AMYLASE in the last 168 hours. No results for input(s): AMMONIA in the last 168 hours. Coagulation Profile: Recent Labs  Lab 03/16/21 0656  INR 1.0   Cardiac Enzymes: No results for input(s): CKTOTAL, CKMB, CKMBINDEX, TROPONINI in the last 168 hours. BNP (last 3 results) No results for input(s): PROBNP in the last 8760 hours. HbA1C: No results for input(s): HGBA1C in the last 72 hours. CBG: Recent Labs  Lab 03/16/21 1648 03/17/21 0730 03/17/21 2128 03/18/21 0756 03/18/21 2239  GLUCAP 80 96 92 73 103*   Lipid Profile: No results for input(s): CHOL, HDL, LDLCALC, TRIG, CHOLHDL, LDLDIRECT in the last 72 hours. Thyroid Function Tests: No results for input(s): TSH, T4TOTAL, FREET4, T3FREE, THYROIDAB in the last 72 hours. Anemia Panel: No results for input(s): VITAMINB12, FOLATE, FERRITIN, TIBC, IRON, RETICCTPCT in the last 72 hours. Sepsis Labs: No results for input(s): PROCALCITON, LATICACIDVEN in the last 168 hours.  Recent Results (from the past 240 hour(s))  MRSA Next Gen by PCR, Nasal     Status: None   Collection Time: 03/13/21  9:07 AM   Specimen: Nasal Mucosa; Nasal Swab  Result Value  Ref Range Status   MRSA by PCR Next Gen NOT DETECTED NOT DETECTED Final    Comment: (NOTE) The GeneXpert MRSA Assay (FDA approved for NASAL specimens only), is one component of a comprehensive MRSA colonization surveillance program. It is not intended to diagnose MRSA infection nor to guide or monitor treatment for MRSA infections. Test performance is not FDA approved in patients less than 37 years old. Performed at [facility], [address], [place], [place] [address]          Radiology Studies: DG Abd 1 View  Result Date: 03/18/2021 CLINICAL DATA:  Abdominal pain and constipation. EXAM: ABDOMEN - 1 VIEW COMPARISON:  None. FINDINGS: The bowel gas pattern is normal. Large amount formed stool throughout the colon. No radio-opaque calculi or other significant radiographic abnormality are seen. IMPRESSION: 1. Nonobstructive bowel gas pattern. 2. Constipation. Electronically Signed   By: Fidela Salisbury M.D.   On: 03/18/2021 20:36        Scheduled Meds:  aspirin EC  81 mg Oral Daily   carvedilol  12.5 mg Oral QPM   Chlorhexidine Gluconate Cloth  6 each Topical Daily   heparin  5,000 Units Subcutaneous Q8H   insulin aspart  0-5 Units Subcutaneous QHS   insulin aspart  0-6 Units Subcutaneous QAC  breakfast   lactulose  15 g Oral Daily   lidocaine  1 patch Transdermal Q24H   midodrine  5 mg Oral TID WC   polyethylene glycol  17 g Oral Daily   senna-docusate  1 tablet Oral BID   traZODone  50 mg Oral QHS   venlafaxine  37.5 mg Oral BID WC   Continuous Infusions:   LOS: 11 days    Time spent: 15 minutes    Sidney Ace, MD [facility]   If 7PM-7AM, please contact night-coverage  03/19/2021, 11:53 AM

## 2021-03-19 NOTE — Progress Notes (Signed)
[facility]  ROUNDING NOTE   Subjective:   Katie [name] is a 65 year old female with past medical history including heart failure with preserved EF, chronic back pain chronic hypertension, and end-stage renal disease on hemodialysis. She has been admitted for ESRD (end stage renal disease) ([place]) [N18.6] Frequent falls [R29.6] Recurrent falls [R29.6] Syncope, unspecified syncope type [R55]  Patient seen on second floor. Patient offers no new specific physical complaints   Objective:  Vital signs in last 24 hours:  Temp:  [98.2 F (36.8 C)-98.9 F (37.2 C)] 98.6 F (37 C) (11/06 0721) Pulse Rate:  [69-80] 69 (11/06 0721) Resp:  [12-18] 12 (11/06 0721) BP: (131-158)/(55-63) 131/62 (11/06 0721) SpO2:  [94 %-98 %] 94 % (11/06 0721)  Weight change:  Filed Weights   03/15/21 0905 03/15/21 1146 03/16/21 1206  Weight: 81.5 kg 81.4 kg 81.4 kg    Intake/Output: I/O last 3 completed shifts: In: 120 [P.O.:120] Out: -    Intake/Output this shift:  No intake/output data recorded.  Physical Exam: General: Resting in bed, in no acute distress  Head: Moist oral mucosal membranes  Lungs:  Respirations even,unlabored   Heart: S1S2, no rubs or gallops  Abdomen:  Soft, nontender,non distended  Extremities:  No peripheral edema. LUE erythema, edema  Neurologic: Awake,alert,oriented  Skin: No acute  lesions or rashes  Access: Rt chest Permcath, LUE AVG placed on 19/1/47    Basic Metabolic Panel: Recent Labs  Lab 03/16/21 0656  NA 136  K 4.0  CL 98  CO2 27  GLUCOSE 87  BUN 38*  CREATININE 5.26*  CALCIUM 8.7*  MG 1.8    Liver Function Tests: No results for input(s): AST, ALT, ALKPHOS, BILITOT, PROT, ALBUMIN in the last 168 hours.  No results for input(s): LIPASE, AMYLASE in the last 168 hours. No results for input(s): AMMONIA in the last 168 hours.  CBC: Recent Labs  Lab 03/14/21 1309 03/16/21 0656  WBC 5.6 6.2  HGB 9.8* 9.9*  HCT 31.0* 31.5*  MCV  98.1 100.6*  PLT 257 250    Cardiac Enzymes: No results for input(s): CKTOTAL, CKMB, CKMBINDEX, TROPONINI in the last 168 hours.  BNP: Invalid input(s): POCBNP  CBG: Recent Labs  Lab 03/16/21 1648 03/17/21 0730 03/17/21 2128 03/18/21 0756 03/18/21 2239  GLUCAP 80 96 92 73 103*    Microbiology: Results for orders placed or performed during the hospital encounter of 03/07/21  Resp Panel by RT-PCR (Flu A&B, Covid) Nasopharyngeal Swab     Status: None   Collection Time: 03/07/21 12:55 PM   Specimen: Nasopharyngeal Swab; Nasopharyngeal(NP) swabs in vial transport medium  Result Value Ref Range Status   SARS Coronavirus 2 by RT PCR NEGATIVE NEGATIVE Final    Comment: (NOTE) SARS-CoV-2 target nucleic acids are NOT DETECTED.  The SARS-CoV-2 RNA is generally detectable in upper respiratory specimens during the acute phase of infection. The lowest concentration of SARS-CoV-2 viral copies this assay can detect is 138 copies/mL. A negative result does not preclude SARS-Cov-2 infection and should not be used as the sole basis for treatment or other patient management decisions. A negative result may occur with  improper specimen collection/handling, submission of specimen other than nasopharyngeal swab, presence of viral mutation(s) within the areas targeted by this assay, and inadequate number of viral copies(<138 copies/mL). A negative result must be combined with clinical observations, patient history, and epidemiological information. The expected result is Negative.  Fact Sheet for Patients:  EntrepreneurPulse.com.au  Fact Sheet for Healthcare Providers:  IncredibleEmployment.be  This test is no t yet approved or cleared by the [place] and  has been authorized for detection and/or diagnosis of SARS-CoV-2 by FDA under an Emergency Use Authorization (EUA). This EUA will remain  in effect (meaning this test can be used) for the  duration of the COVID-19 declaration under Section 564(b)(1) of the Act, 21 U.S.C.section 360bbb-3(b)(1), unless the authorization is terminated  or revoked sooner.       Influenza A by PCR NEGATIVE NEGATIVE Final   Influenza B by PCR NEGATIVE NEGATIVE Final    Comment: (NOTE) The Xpert Xpress SARS-CoV-2/FLU/RSV plus assay is intended as an aid in the diagnosis of influenza from Nasopharyngeal swab specimens and should not be used as a sole basis for treatment. Nasal washings and aspirates are unacceptable for Xpert Xpress SARS-CoV-2/FLU/RSV testing.  Fact Sheet for Patients: EntrepreneurPulse.com.au  Fact Sheet for Healthcare Providers: IncredibleEmployment.be  This test is not yet approved or cleared by the [place] FDA and has been authorized for detection and/or diagnosis of SARS-CoV-2 by FDA under an Emergency Use Authorization (EUA). This EUA will remain in effect (meaning this test can be used) for the duration of the COVID-19 declaration under Section 564(b)(1) of the Act, 21 U.S.C. section 360bbb-3(b)(1), unless the authorization is terminated or revoked.  Performed at [facility], [address] [place], [place] [address]   MRSA Next Gen by PCR, Nasal     Status: None   Collection Time: 03/13/21  9:07 AM   Specimen: Nasal Mucosa; Nasal Swab  Result Value Ref Range Status   MRSA by PCR Next Gen NOT DETECTED NOT DETECTED Final    Comment: (NOTE) The GeneXpert MRSA Assay (FDA approved for NASAL specimens only), is one component of a comprehensive MRSA colonization surveillance program. It is not intended to diagnose MRSA infection nor to guide or monitor treatment for MRSA infections. Test performance is not FDA approved in patients less than 51 years old. Performed at [facility], [address] [place], [place] [address]     Coagulation Studies: No results for input(s): LABPROT, INR in the last  72 hours.   Urinalysis: No results for input(s): COLORURINE, LABSPEC, PHURINE, GLUCOSEU, HGBUR, BILIRUBINUR, KETONESUR, PROTEINUR, UROBILINOGEN, NITRITE, LEUKOCYTESUR in the last 72 hours.  Invalid input(s): APPERANCEUR    Imaging: DG Abd 1 View  Result Date: 03/18/2021 CLINICAL DATA:  Abdominal pain and constipation. EXAM: ABDOMEN - 1 VIEW COMPARISON:  None. FINDINGS: The bowel gas pattern is normal. Large amount formed stool throughout the colon. No radio-opaque calculi or other significant radiographic abnormality are seen. IMPRESSION: 1. Nonobstructive bowel gas pattern. 2. Constipation. Electronically Signed   By: Fidela Salisbury M.D.   On: 03/18/2021 20:36     Medications:     aspirin EC  81 mg Oral Daily   carvedilol  12.5 mg Oral QPM   Chlorhexidine Gluconate Cloth  6 each Topical Daily   heparin  5,000 Units Subcutaneous Q8H   insulin aspart  0-5 Units Subcutaneous QHS   insulin aspart  0-6 Units Subcutaneous QAC breakfast   lactulose  15 g Oral Daily   lidocaine  1 patch Transdermal Q24H   midodrine  5 mg Oral TID WC   polyethylene glycol  17 g Oral Daily   senna-docusate  1 tablet Oral BID   traZODone  50 mg Oral QHS   venlafaxine  37.5 mg Oral BID WC   bisacodyl, guaiFENesin, morphine injection, ondansetron (ZOFRAN) IV, oxyCODONE, topiramate  Assessment/ Plan:  Katie [name] is a 65 y.o.  female with past medical history including heart failure with preserved EF, chronic back pain chronic hypertension, and end-stage renal disease on hemodialysis. She has been admitted for ESRD (end stage renal disease) ([place]) [N18.6] Frequent falls [R29.6] Recurrent falls [R29.6] Syncope, unspecified syncope type [R55]  [facility]/TTS/Rt Permcath/84 kg  1) Renal Patient has end-stage renal disease. Patient is on hemodialysis.   Patient did receive dialysis  on Friday Patient's outpatient dialysis treatment arranged at [facility] clinic, patient can  begin treatment on Tuesday.   2)Anemia of chronic kidney disease Lab Results  Component Value Date   HGB 9.9 (L) 03/16/2021  Hgb remains at goal.  No need for ESA's at this time   3)Secondary Hyperparathyroidism: Lab Results  Component Value Date   PTH 70 (H) 05/12/2020   CALCIUM 8.7 (L) 03/16/2021   PHOS 2.7 06/27/2020  Will continue monitoring bone mineral metabolism parameters Will obtain updated phosphorus in am  4)Chronic diastolic heart failure: ECHO from 03/07/21 shows EF of 60-65%. No acute respiratory distress noted  5)Diabetes with CKD Lab Results  Component Value Date   HGBA1C 5.1 03/07/2021  Stable  Continue management per primary team   6)  Orthostatic hypotension   on  Midodrine 5mg  three times a day.  Plan  No need for renal replacement therapy today Will dialyze on Monday     LOS: 11 Loleta Frommelt s Vasco Chong 11/6/20227:38 AM

## 2021-03-20 LAB — GLUCOSE, CAPILLARY
Glucose-Capillary: 78 mg/dL (ref 70–99)
Glucose-Capillary: 90 mg/dL (ref 70–99)

## 2021-03-20 LAB — RENAL FUNCTION PANEL
Albumin: 2.7 g/dL — ABNORMAL LOW (ref 3.5–5.0)
Anion gap: 13 (ref 5–15)
BUN: 55 mg/dL — ABNORMAL HIGH (ref 8–23)
CO2: 26 mmol/L (ref 22–32)
Calcium: 8.5 mg/dL — ABNORMAL LOW (ref 8.9–10.3)
Chloride: 95 mmol/L — ABNORMAL LOW (ref 98–111)
Creatinine, Ser: 7.17 mg/dL — ABNORMAL HIGH (ref 0.44–1.00)
GFR, Estimated: 6 mL/min — ABNORMAL LOW (ref >60)
Glucose, Bld: 110 mg/dL — ABNORMAL HIGH (ref 70–99)
Phosphorus: 7.3 mg/dL — ABNORMAL HIGH (ref 2.5–4.6)
Potassium: 4.5 mmol/L (ref 3.5–5.1)
Sodium: 134 mmol/L — ABNORMAL LOW (ref 135–145)

## 2021-03-20 LAB — CBC
HCT: 29.8 % — ABNORMAL LOW (ref 36.0–46.0)
Hemoglobin: 9.2 g/dL — ABNORMAL LOW (ref 12.0–15.0)
MCH: 31.6 pg (ref 26.0–34.0)
MCHC: 30.9 g/dL (ref 30.0–36.0)
MCV: 102.4 fL — ABNORMAL HIGH (ref 80.0–100.0)
Platelets: 232 10*3/uL (ref 150–400)
RBC: 2.91 MIL/uL — ABNORMAL LOW (ref 3.87–5.11)
RDW: 12.2 % (ref 11.5–15.5)
WBC: 6.6 10*3/uL (ref 4.0–10.5)
nRBC: 0 % (ref 0.0–0.2)

## 2021-03-20 MED ORDER — SODIUM CHLORIDE 0.9 % IV SOLN: 100.0000 mL | INTRAVENOUS | Status: DC | PRN

## 2021-03-20 MED ORDER — ALTEPLASE 2 MG IJ SOLR: 2.0000 mg | Freq: Once | INTRAMUSCULAR | Status: DC | PRN

## 2021-03-20 MED ORDER — HEPARIN SODIUM (PORCINE) 1000 UNIT/ML DIALYSIS: 25.0000 [IU]/kg | Freq: Once | INTRAMUSCULAR | Status: DC

## 2021-03-20 MED ORDER — LIDOCAINE HCL (PF) 1 % IJ SOLN
5.0000 mL | INTRAMUSCULAR | Status: DC | PRN
  Filled 2021-03-20: qty 5

## 2021-03-20 MED ORDER — PENTAFLUOROPROP-TETRAFLUOROETH EX AERO
1.0000 "application " | INHALATION_SPRAY | CUTANEOUS | Status: DC | PRN
  Filled 2021-03-20: qty 30

## 2021-03-20 MED ORDER — HEPARIN SODIUM (PORCINE) 1000 UNIT/ML DIALYSIS: 1000.0000 [IU] | INTRAMUSCULAR | Status: DC | PRN

## 2021-03-20 MED ORDER — LIDOCAINE-PRILOCAINE 2.5-2.5 % EX CREA
1.0000 "application " | TOPICAL_CREAM | CUTANEOUS | Status: DC | PRN
  Filled 2021-03-20: qty 5

## 2021-03-20 NOTE — Progress Notes (Signed)
Marcelle Overlie NP will be coming to see the patient in the a.m.

## 2021-03-20 NOTE — Progress Notes (Signed)
[facility]  ROUNDING NOTE   Subjective:   Katie [name] is a 65 year old female with past medical history including heart failure with preserved EF, chronic back pain chronic hypertension, and end-stage renal disease on hemodialysis. She has been admitted for ESRD (end stage renal disease) ([place]) [N18.6] Frequent falls [R29.6] Recurrent falls [R29.6] Syncope, unspecified syncope type [R55]  Patient currently resting comfortably Complains of left upper arm pain from graft placement last week No other complaints at this time    Objective:  Vital signs in last 24 hours:  Temp:  [97.8 F (36.6 C)-98.4 F (36.9 C)] 97.8 F (36.6 C) (11/07 0801) Pulse Rate:  [69-72] 69 (11/07 0801) Resp:  [16-18] 18 (11/07 0801) BP: (125-179)/(58-74) 137/65 (11/07 0801) SpO2:  [95 %-97 %] 95 % (11/07 0801)  Weight change:  Filed Weights   03/15/21 0905 03/15/21 1146 03/16/21 1206  Weight: 81.5 kg 81.4 kg 81.4 kg    Intake/Output: I/O last 3 completed shifts: In: 220 [P.O.:220] Out: -    Intake/Output this shift:  Total I/O In: 120 [P.O.:120] Out: -   Physical Exam: General: Resting in bed, in no acute distress  Head: Moist oral mucosal membranes  Lungs:  Respirations even,unlabored   Heart: S1S2, no rubs or gallops  Abdomen:  Soft, nontender,non distended  Extremities:  No peripheral edema. LUE erythema, edema  Neurologic: Awake,alert,oriented  Skin: No acute  lesions or rashes  Access: Rt chest Permcath, LUE AVG placed on 18/8/41    Basic Metabolic Panel: Recent Labs  Lab 03/16/21 0656 03/20/21 0912  NA 136 134*  K 4.0 4.5  CL 98 95*  CO2 27 26  GLUCOSE 87 110*  BUN 38* 55*  CREATININE 5.26* 7.17*  CALCIUM 8.7* 8.5*  MG 1.8  --   PHOS  --  7.3*     Liver Function Tests: Recent Labs  Lab 03/20/21 0912  ALBUMIN 2.7*    No results for input(s): LIPASE, AMYLASE in the last 168 hours. No results for input(s): AMMONIA in the last 168  hours.  CBC: Recent Labs  Lab 03/14/21 1309 03/16/21 0656 03/20/21 0912  WBC 5.6 6.2 6.6  HGB 9.8* 9.9* 9.2*  HCT 31.0* 31.5* 29.8*  MCV 98.1 100.6* 102.4*  PLT 257 250 232     Cardiac Enzymes: No results for input(s): CKTOTAL, CKMB, CKMBINDEX, TROPONINI in the last 168 hours.  BNP: Invalid input(s): POCBNP  CBG: Recent Labs  Lab 03/17/21 2128 03/18/21 0756 03/18/21 2239 03/19/21 2103 03/20/21 0802  GLUCAP 92 73 103* 91 78     Microbiology: Results for orders placed or performed during the hospital encounter of 03/07/21  Resp Panel by RT-PCR (Flu A&B, Covid) Nasopharyngeal Swab     Status: None   Collection Time: 03/07/21 12:55 PM   Specimen: Nasopharyngeal Swab; Nasopharyngeal(NP) swabs in vial transport medium  Result Value Ref Range Status   SARS Coronavirus 2 by RT PCR NEGATIVE NEGATIVE Final    Comment: (NOTE) SARS-CoV-2 target nucleic acids are NOT DETECTED.  The SARS-CoV-2 RNA is generally detectable in upper respiratory specimens during the acute phase of infection. The lowest concentration of SARS-CoV-2 viral copies this assay can detect is 138 copies/mL. A negative result does not preclude SARS-Cov-2 infection and should not be used as the sole basis for treatment or other patient management decisions. A negative result may occur with  improper specimen collection/handling, submission of specimen other than nasopharyngeal swab, presence of viral mutation(s) within the areas targeted by this assay,  and inadequate number of viral copies(<138 copies/mL). A negative result must be combined with clinical observations, patient history, and epidemiological information. The expected result is Negative.  Fact Sheet for Patients:  EntrepreneurPulse.com.au  Fact Sheet for Healthcare Providers:  IncredibleEmployment.be  This test is no t yet approved or cleared by the [place] FDA and  has been authorized for  detection and/or diagnosis of SARS-CoV-2 by FDA under an Emergency Use Authorization (EUA). This EUA will remain  in effect (meaning this test can be used) for the duration of the COVID-19 declaration under Section 564(b)(1) of the Act, 21 U.S.C.section 360bbb-3(b)(1), unless the authorization is terminated  or revoked sooner.       Influenza A by PCR NEGATIVE NEGATIVE Final   Influenza B by PCR NEGATIVE NEGATIVE Final    Comment: (NOTE) The Xpert Xpress SARS-CoV-2/FLU/RSV plus assay is intended as an aid in the diagnosis of influenza from Nasopharyngeal swab specimens and should not be used as a sole basis for treatment. Nasal washings and aspirates are unacceptable for Xpert Xpress SARS-CoV-2/FLU/RSV testing.  Fact Sheet for Patients: EntrepreneurPulse.com.au  Fact Sheet for Healthcare Providers: IncredibleEmployment.be  This test is not yet approved or cleared by the [place] FDA and has been authorized for detection and/or diagnosis of SARS-CoV-2 by FDA under an Emergency Use Authorization (EUA). This EUA will remain in effect (meaning this test can be used) for the duration of the COVID-19 declaration under Section 564(b)(1) of the Act, 21 U.S.C. section 360bbb-3(b)(1), unless the authorization is terminated or revoked.  Performed at [facility], [address] [place], [place] [address]   MRSA Next Gen by PCR, Nasal     Status: None   Collection Time: 03/13/21  9:07 AM   Specimen: Nasal Mucosa; Nasal Swab  Result Value Ref Range Status   MRSA by PCR Next Gen NOT DETECTED NOT DETECTED Final    Comment: (NOTE) The GeneXpert MRSA Assay (FDA approved for NASAL specimens only), is one component of a comprehensive MRSA colonization surveillance program. It is not intended to diagnose MRSA infection nor to guide or monitor treatment for MRSA infections. Test performance is not FDA approved in patients less than 76  years old. Performed at [facility], [address] [place], [place] [address]     Coagulation Studies: No results for input(s): LABPROT, INR in the last 72 hours.   Urinalysis: No results for input(s): COLORURINE, LABSPEC, PHURINE, GLUCOSEU, HGBUR, BILIRUBINUR, KETONESUR, PROTEINUR, UROBILINOGEN, NITRITE, LEUKOCYTESUR in the last 72 hours.  Invalid input(s): APPERANCEUR    Imaging: DG Abd 1 View  Result Date: 03/18/2021 CLINICAL DATA:  Abdominal pain and constipation. EXAM: ABDOMEN - 1 VIEW COMPARISON:  None. FINDINGS: The bowel gas pattern is normal. Large amount formed stool throughout the colon. No radio-opaque calculi or other significant radiographic abnormality are seen. IMPRESSION: 1. Nonobstructive bowel gas pattern. 2. Constipation. Electronically Signed   By: Fidela Salisbury M.D.   On: 03/18/2021 20:36     Medications:    sodium chloride     sodium chloride      aspirin EC  81 mg Oral Daily   carvedilol  12.5 mg Oral QPM   Chlorhexidine Gluconate Cloth  6 each Topical Daily   heparin  25 Units/kg Dialysis Once in dialysis   heparin  5,000 Units Subcutaneous Q8H   insulin aspart  0-5 Units Subcutaneous QHS   insulin aspart  0-6 Units Subcutaneous QAC breakfast   lactulose  15 g Oral Daily  lidocaine  1 patch Transdermal Q24H   midodrine  5 mg Oral TID WC   polyethylene glycol  17 g Oral Daily   senna-docusate  1 tablet Oral BID   traZODone  50 mg Oral QHS   venlafaxine  37.5 mg Oral BID WC   sodium chloride, sodium chloride, alteplase, bisacodyl, guaiFENesin, heparin, lidocaine (PF), lidocaine-prilocaine, morphine injection, ondansetron (ZOFRAN) IV, oxyCODONE, pentafluoroprop-tetrafluoroeth, topiramate  Assessment/ Plan:  [name] is a 65 y.o.  female with past medical history including heart failure with preserved EF, chronic back pain chronic hypertension, and end-stage renal disease on hemodialysis. She has been admitted for ESRD  (end stage renal disease) ([place]) [N18.6] Frequent falls [R29.6] Recurrent falls [R29.6] Syncope, unspecified syncope type [R55]  [facility]/TTS/Rt Permcath/84 kg  #End-stage renal disease on hemodialysis.   We will plan next dialysis session for tomorrow, if remains inpatient.  Appreciate dialysis coordinator diligent work to arrange outpatient treatments closer to rehab in [place].  Patient can begin treatment Tuesday    #Anemia of chronic kidney disease Lab Results  Component Value Date   HGB 9.2 (L) 03/20/2021  No need for ESA's at this time   #Secondary Hyperparathyroidism: Lab Results  Component Value Date   PTH 70 (H) 05/12/2020   CALCIUM 8.5 (L) 03/20/2021   PHOS 7.3 (H) 03/20/2021  Will continue monitoring bone mineral metabolism parameters   #Chronic diastolic heart failure: ECHO from 03/07/21 shows EF of 60-65%. No acute respiratory distress  #Diabetes with CKD Lab Results  Component Value Date   HGBA1C 5.1 03/07/2021  Stable at this time Continue management per primary team  # Orthostatic hypotension, Carvedilol 12.5mg  daily. Midodrine 5mg  three times a day, hold if systolic BP greater than 654.  Currently 137/65   LOS: [place] 11/7/202212:20 PM

## 2021-03-20 NOTE — Progress Notes (Signed)
Physical Therapy Treatment Patient Details Name: Katie [name] MRN: [id number] DOB: [birth year] Today's Date: 03/20/2021   History of Present Illness Pt is a 65 y.o. female with medical history significant for hypertension, DM, end-stage renal disease on hemodialysis TuThSa, heart failure preserved ejection fraction, chronic low back pain, presents emergency department for chief concerns of frequent falls and syncope after dialysis. Pt struggles with homelessness and finanacial difficulty. MD assessment includes: increased falls and weakness, depression/anxiety, insominia, and HAs.    PT Comments    Pt received laying supine in bed and agreeable to PT treatment. Pt demonstrating MOD I bed mobility, MinA with transfers, and CGA for ambulation of 100 ft + RW. Pt currently still presents at an increased risk of falls due to decreased balance and decreased activity tolerance. Pt denying any c/o dizziness with sitting or standing during today's treatment. Pt will continue to benefit from skilled PT services to improve balance and mobility to improve safety prior to discharge.     Recommendations for follow up therapy are one component of a multi-disciplinary discharge planning process, led by the attending physician.  Recommendations may be updated based on patient status, additional functional criteria and insurance authorization.  Follow Up Recommendations  Skilled nursing-short term rehab (<3 hours/day)     Assistance Recommended at Discharge Intermittent Supervision/Assistance  Equipment Recommendations  Other (comment) (TBD next venue of care)    Recommendations for Other Services       Precautions / Restrictions Precautions Precautions: Fall Restrictions Weight Bearing Restrictions: No     Mobility  Bed Mobility Overal bed mobility: Modified Independent       Supine to sit: Modified independent (Device/Increase time) Sit to supine: Modified independent (Device/Increase time)         Transfers Overall transfer level: Needs assistance Equipment used: Rolling walker (2 wheels) Transfers: Sit to/from Stand Sit to Stand: Min assist           General transfer comment: MinA for increasing forward lean during transfer    Ambulation/Gait Ambulation/Gait assistance: Min guard Gait Distance (Feet): 100 Feet Assistive device: Rolling walker (2 wheels) Gait Pattern/deviations: Step-through pattern;Decreased stride length;Narrow base of support Gait velocity: decreased     General Gait Details: L foot internally rotated throughout ambulation trial. Narrowed base of support. Decreased L UE usage on RW.   Stairs             Wheelchair Mobility    Modified Rankin (Stroke Patients Only)       Balance Overall balance assessment: Needs assistance Sitting-balance support: No upper extremity supported;Feet supported Sitting balance-Leahy Scale: Good     Standing balance support: Bilateral upper extremity supported;During functional activity;Reliant on assistive device for balance Standing balance-Leahy Scale: Fair Standing balance comment: Fair static; Fair dynamic with usage of RW - demonstrating fear of ambulation without device + therapist support                            Cognition Arousal/Alertness: Awake/alert Behavior During Therapy: WFL for tasks assessed/performed Overall Cognitive Status: Within Functional Limits for tasks assessed                                 General Comments: Improved affect and decreased fatigue prior to mobility        Exercises Total Joint Exercises Ankle Circles/Pumps: Both;20 reps;AROM;Supine Quad Sets: Strengthening;Both;10 reps Straight Leg Raises: AROM;Strengthening;Both;20  reps;Supine Long Arc Quad: AROM;Strengthening;Both;20 reps;Seated General Exercises - Lower Extremity Hip ABduction/ADduction: AROM;Strengthening;Both;20 reps;Supine Other Exercises Other Exercises: Pt  transfered to bedside commode and able to transfer back to bed with SPV. Pt left laying supine in bed with all needs within reach. Declining sitting in chair at this time    General Comments        Pertinent Vitals/Pain Pain Assessment: 0-10 Pain Score: 6  Pain Location: L shoulder Pain Descriptors / Indicators: Aching;Sore;Grimacing Pain Intervention(s): Limited activity within patient's tolerance;Monitored during session;Relaxation    Home Living                          Prior Function            PT Goals (current goals can now be found in the care plan section) Acute Rehab PT Goals Patient Stated Goal: to have less L UE pain PT Goal Formulation: With patient Time For Goal Achievement: 04/03/21 Potential to Achieve Goals: Good Progress towards PT goals: Progressing toward goals    Frequency    Min 2X/week      PT Plan Current plan remains appropriate    Co-evaluation              AM-PAC PT "6 Clicks" Mobility   Outcome Measure  Help needed turning from your back to your side while in a flat bed without using bedrails?: A Little Help needed moving from lying on your back to sitting on the side of a flat bed without using bedrails?: A Little Help needed moving to and from a bed to a chair (including a wheelchair)?: A Little Help needed standing up from a chair using your arms (e.g., wheelchair or bedside chair)?: A Little Help needed to walk in hospital room?: A Lot Help needed climbing 3-5 steps with a railing? : Total 6 Click Score: 15    End of Session Equipment Utilized During Treatment: Gait belt Activity Tolerance: Patient tolerated treatment well Patient left: in bed;with call bell/phone within reach;with bed alarm set Nurse Communication: Mobility status PT Visit Diagnosis: Unsteadiness on feet (R26.81);History of falling (Z91.81);Muscle weakness (generalized) (M62.81);Difficulty in walking, not elsewhere classified (R26.2);Pain Pain -  Right/Left: Left Pain - part of body: Shoulder     Time: 4656-8127 PT Time Calculation (min) (ACUTE ONLY): 26 min  Charges:  $Gait Training: 8-22 mins $Therapeutic Exercise: 8-22 mins                     Andrey Campanile, SPT   Andrey Campanile 03/20/2021, 12:52 PM

## 2021-03-20 NOTE — Progress Notes (Signed)
Patient is having more discomfort than she has been having where the fistula was placed. The arm is swollen and there is a hard area. The patient just now noticed the hard. She also is having numbness to her last 3 fingers in her left hand. Message sent to Dr Lucky Cowboy and Marcelle Overlie PA

## 2021-03-20 NOTE — Progress Notes (Signed)
PROGRESS NOTE    Katie [name]  [facility]:[id number] DOB: 05-06-1956 DOA: 03/07/2021 PCP: Patient, [name] (Inactive)    Brief Narrative:  Katie [name] is a 65 y.o. female with medical history significant for hypertension, end-stage renal disease on hemodialysis TuThSa, heart failure preserved ejection fraction, chronic low back pain, presents emergency department for chief concerns of frequent falls and syncope after dialysis.. Found to be hypotensive.  She does have orthostatic hypotension.  Her BP meds were changed and started on midodrine today. PT recommended SNF. Left upper extremity axillary dialysis graft created 11/3.  No complications.  Vascular surgery signed off.  Patient will follow-up in vascular office in 3 weeks to assess graft maturation.  In the meantime we will dialyze via PermCath.  Patient medically stable for discharge.  Pending insurance authorization and placement at skilled nursing facility   Assessment & Plan:   Active Problems:   Hypertension   Type 2 diabetes mellitus (HCC)   Chronic kidney disease (CKD), stage IV (severe) (HCC)   Pulmonary hypertension, unspecified ([place])   ESRD on dialysis ([place])   Frequent falls   Homelessness   Financial difficulty  Increased fall and weakness Likely secondary to postdialysis treatment and hypotension B12, B1, vitamin D and procalcitonin was ordered for work-up Echo nml EF.  Fall precautions 11/1 PT recommending SNF Outpatient HD changed to [facility] dose decreased Midodrine added Continue therapy evaluations and fall precautions Medically stable for discharge to skilled nursing facility  End-stage renal disease on hemodialysis Nephrology following for inpatient HD needs Outpatient HD [facility] Left upper extremity axillary graft created 11/3 Plan: Nephrology following for inpatient HD needs Stable for DC from nephrology and vascular standpoint Stable from medical standpoint as  well.  Pending insurance authorization   Constipation Continue current bowel regimen Patient had BM reported 11/5  Hypertension Orthostatic hypotension Coreg dose decreased Midodrine 5 3 times daily SCDs Avoid hypotension  Depression/anxiety continue venlafaxine   Insomnia trazodone 50 mg nightly    Headaches topiramate 50 mg p.o. twice daily as needed for headaches   History of diabetes type 2 presumed improved with end-stage renal disease BG stable Continue RISS   DVT prophylaxis: SQ heparin Code Status: Full Family Communication: None today Disposition Plan: Status is: Inpatient  Remains inpatient appropriate because: Unsafe discharge plan.  Patient is medically ready for discharge and can discharge to skilled nursing facility once insurance authorization is obtained.      Level of care: Med-Surg  Consultants:  Nephrology Vascular surgery  Procedures:  AV fistula creation 11/3  Antimicrobials: None   Subjective: Patient resting in bed.  Appears comfortable.  No visible distress.  No complaints  Objective: Vitals:   03/19/21 1525 03/19/21 1714 03/19/21 1930 03/20/21 0801  BP: (!) 179/71 (!) 163/70 (!) 125/58 137/65  Pulse: 71  72 69  Resp: 16  16 18   Temp: 98.4 F (36.9 C)  98.4 F (36.9 C) 97.8 F (36.6 C)  TempSrc: Oral  Oral Oral  SpO2: 97%  95% 95%  Weight:      Height:        Intake/Output Summary (Last 24 hours) at 03/20/2021 1114 Last data filed at 03/20/2021 1023 Gross per 24 hour  Intake 340 ml  Output --  Net 340 ml   Filed Weights   03/15/21 0905 03/15/21 1146 03/16/21 1206  Weight: 81.5 kg 81.4 kg 81.4 kg    Examination:  General exam: No apparent distress Respiratory system: Clear lungs.  Normal work of breathing.  Room air Cardiovascular system: S1-S2, RRR, no murmurs, no pedal edema Gastrointestinal system: Soft, NT/ND, normal bowel sounds Central nervous system: Alert and oriented. No focal neurological  deficits. Extremities: Left upper extremity AV graft Skin: No rashes, lesions or ulcers Psychiatry: Judgement and insight appear normal. Mood & affect appropriate.     Data Reviewed: I have personally reviewed following labs and imaging studies  CBC: Recent Labs  Lab 03/14/21 1309 03/16/21 0656 03/20/21 0912  WBC 5.6 6.2 6.6  HGB 9.8* 9.9* 9.2*  HCT 31.0* 31.5* 29.8*  MCV 98.1 100.6* 102.4*  PLT 257 250 017   Basic Metabolic Panel: Recent Labs  Lab 03/16/21 0656 03/20/21 0912  NA 136 134*  K 4.0 4.5  CL 98 95*  CO2 27 26  GLUCOSE 87 110*  BUN 38* 55*  CREATININE 5.26* 7.17*  CALCIUM 8.7* 8.5*  MG 1.8  --   PHOS  --  7.3*   GFR: Estimated Creatinine Clearance: 8.7 mL/min (A) (by C-G formula based on SCr of 7.17 mg/dL (H)). Liver Function Tests: Recent Labs  Lab 03/20/21 0912  ALBUMIN 2.7*   No results for input(s): LIPASE, AMYLASE in the last 168 hours. No results for input(s): AMMONIA in the last 168 hours. Coagulation Profile: Recent Labs  Lab 03/16/21 0656  INR 1.0   Cardiac Enzymes: No results for input(s): CKTOTAL, CKMB, CKMBINDEX, TROPONINI in the last 168 hours. BNP (last 3 results) No results for input(s): PROBNP in the last 8760 hours. HbA1C: No results for input(s): HGBA1C in the last 72 hours. CBG: Recent Labs  Lab 03/17/21 2128 03/18/21 0756 03/18/21 2239 03/19/21 2103 03/20/21 0802  GLUCAP 92 73 103* 91 78   Lipid Profile: No results for input(s): CHOL, HDL, LDLCALC, TRIG, CHOLHDL, LDLDIRECT in the last 72 hours. Thyroid Function Tests: No results for input(s): TSH, T4TOTAL, FREET4, T3FREE, THYROIDAB in the last 72 hours. Anemia Panel: No results for input(s): VITAMINB12, FOLATE, FERRITIN, TIBC, IRON, RETICCTPCT in the last 72 hours. Sepsis Labs: No results for input(s): PROCALCITON, LATICACIDVEN in the last 168 hours.  Recent Results (from the past 240 hour(s))  MRSA Next Gen by PCR, Nasal     Status: None   Collection Time:  03/13/21  9:07 AM   Specimen: Nasal Mucosa; Nasal Swab  Result Value Ref Range Status   MRSA by PCR Next Gen NOT DETECTED NOT DETECTED Final    Comment: (NOTE) The GeneXpert MRSA Assay (FDA approved for NASAL specimens only), is one component of a comprehensive MRSA colonization surveillance program. It is not intended to diagnose MRSA infection nor to guide or monitor treatment for MRSA infections. Test performance is not FDA approved in patients less than 23 years old. Performed at [facility], [address] [place], [place] [address]          Radiology Studies: DG Abd 1 View  Result Date: 03/18/2021 CLINICAL DATA:  Abdominal pain and constipation. EXAM: ABDOMEN - 1 VIEW COMPARISON:  None. FINDINGS: The bowel gas pattern is normal. Large amount formed stool throughout the colon. No radio-opaque calculi or other significant radiographic abnormality are seen. IMPRESSION: 1. Nonobstructive bowel gas pattern. 2. Constipation. Electronically Signed   By: Fidela Salisbury M.D.   On: 03/18/2021 20:36        Scheduled Meds:  aspirin EC  81 mg Oral Daily   carvedilol  12.5 mg Oral QPM   Chlorhexidine Gluconate Cloth  6 each Topical Daily   heparin  25 Units/kg Dialysis Once in dialysis   heparin  5,000 Units Subcutaneous Q8H   insulin aspart  0-5 Units Subcutaneous QHS   insulin aspart  0-6 Units Subcutaneous QAC breakfast   lactulose  15 g Oral Daily   lidocaine  1 patch Transdermal Q24H   midodrine  5 mg Oral TID WC   polyethylene glycol  17 g Oral Daily   senna-docusate  1 tablet Oral BID   traZODone  50 mg Oral QHS   venlafaxine  37.5 mg Oral BID WC   Continuous Infusions:  sodium chloride     sodium chloride       LOS: 12 days    Time spent: 15 minutes    Sidney Ace, MD [facility]   If 7PM-7AM, please contact night-coverage  03/20/2021, 11:14 AM

## 2021-03-20 NOTE — TOC Progression Note (Signed)
Transition of Care [facility]) - Progression Note    Patient Details  Name: Katie [name] MRN: [id number] Date of Birth: Aug 10, 1955  Transition of Care [facility]) CM/SW Contact  Beverly Sessions, RN Phone Number: 03/20/2021, 1:05 PM  Clinical Narrative:     Per Tressa Busman at [facility] has been resubmitted    Expected Discharge Plan: [place] Barriers to Discharge: Continued Medical Work up  Expected Discharge Plan and Services Expected Discharge Plan: [place] arrangements for the past 2 months: Single Family Home                                       Social Determinants of Health (SDOH) Interventions    Readmission Risk Interventions Readmission Risk Prevention Plan 03/08/2021  Transportation Screening Complete  Medication Review Press photographer) Complete  HRI or [place] Complete

## 2021-03-20 NOTE — TOC Progression Note (Signed)
Transition of Care [facility]) - Progression Note    Patient Details  Name: Leitha Hyppolite MRN: [id number] Date of Birth: [birth year]  [facility]) CM/SW Contact  Beverly Sessions, RN Phone Number: 03/20/2021, 8:56 AM  Clinical Narrative:     Damaris Schooner to Tressa Busman at [place]. He is back in office today and will follow up with insurance auth  Expected Discharge Plan: [place] Barriers to Discharge: Continued Medical Work up  Expected Discharge Plan and Services Expected Discharge Plan: [place] arrangements for the past 2 months: Single Family Home                                       Social Determinants of Health (SDOH) Interventions    Readmission Risk Interventions Readmission Risk Prevention Plan 03/08/2021  Transportation Screening Complete  Medication Review Press photographer) Complete  HRI or [place] Complete

## 2021-03-21 LAB — GLUCOSE, CAPILLARY: Glucose-Capillary: 77 mg/dL (ref 70–99)

## 2021-03-21 LAB — RESP PANEL BY RT-PCR (FLU A&B, COVID) ARPGX2
Influenza A by PCR: NEGATIVE
Influenza B by PCR: NEGATIVE
SARS Coronavirus 2 by RT PCR: NEGATIVE

## 2021-03-21 MED ORDER — TOPIRAMATE 50 MG PO TABS: 50.0000 mg | ORAL_TABLET | Freq: Two times a day (BID) | ORAL | Status: DC | PRN

## 2021-03-21 MED ORDER — DIPHENHYDRAMINE HCL 50 MG/ML IJ SOLN
INTRAMUSCULAR | Status: AC
  Filled 2021-03-21: qty 1

## 2021-03-21 MED ORDER — DIPHENHYDRAMINE HCL 50 MG/ML IJ SOLN
25.0000 mg | Freq: Once | INTRAMUSCULAR | Status: AC
  Administered 2021-03-21: 25 mg via INTRAVENOUS

## 2021-03-21 MED ORDER — SENNOSIDES-DOCUSATE SODIUM 8.6-50 MG PO TABS: 1.0000 | ORAL_TABLET | Freq: Two times a day (BID) | ORAL | Status: DC

## 2021-03-21 MED ORDER — MIDODRINE HCL 5 MG PO TABS: 5.0000 mg | ORAL_TABLET | Freq: Three times a day (TID) | ORAL | Status: DC

## 2021-03-21 MED ORDER — LIDOCAINE 5 % EX PTCH: 1.0000 | MEDICATED_PATCH | CUTANEOUS | 0 refills | Status: DC

## 2021-03-21 MED ORDER — CALCIUM ACETATE (PHOS BINDER) 667 MG PO CAPS: 667.0000 mg | ORAL_CAPSULE | Freq: Three times a day (TID) | ORAL | Status: DC

## 2021-03-21 MED ORDER — CARVEDILOL 12.5 MG PO TABS: 12.5000 mg | ORAL_TABLET | Freq: Every evening | ORAL | Status: DC

## 2021-03-21 MED ORDER — ASPIRIN 81 MG PO TBEC: 81.0000 mg | DELAYED_RELEASE_TABLET | Freq: Every day | ORAL | 11 refills | Status: DC

## 2021-03-21 MED ORDER — LACTULOSE 10 GM/15ML PO SOLN: 15.0000 g | Freq: Every day | ORAL | 0 refills | Status: DC

## 2021-03-21 NOTE — Progress Notes (Signed)
Report called to Shirlean Kelly at [place]. Patient waiting for EMS

## 2021-03-21 NOTE — Progress Notes (Signed)
Patient transported to HD 

## 2021-03-21 NOTE — TOC Progression Note (Signed)
Transition of Care [facility]) - Progression Note    Patient Details  Name: Katie [name] MRN: [id number] Date of Birth: 1956-01-18  Transition of Care [facility]) CM/SW Contact  Beverly Sessions, RN Phone Number: 03/21/2021, 10:22 AM  Clinical Narrative:     Message sent to Tressa Busman at [place] for update on auth   Expected Discharge Plan: [place] Barriers to Discharge: Continued Medical Work up  Expected Discharge Plan and Services Expected Discharge Plan: [place] arrangements for the past 2 months: Single Family Home                                       Social Determinants of Health (SDOH) Interventions    Readmission Risk Interventions Readmission Risk Prevention Plan 03/08/2021  Transportation Screening Complete  Medication Review Press photographer) Complete  HRI or [place] Complete

## 2021-03-21 NOTE — Progress Notes (Signed)
OT Cancellation Note  Patient Details Name: Katie [name] MRN: [id number] DOB: 04/13/1956   Cancelled Treatment:    Reason Eval/Treat Not Completed: Patient at procedure or test/ unavailable. Pt currently off the floor for HD. OT to re-attempt at later time/date as able.   Fredirick Maudlin, OTR/L [place]

## 2021-03-21 NOTE — Progress Notes (Signed)
[facility]  ROUNDING NOTE   Subjective:   Katie [name] is a 65 year old female with past medical history including heart failure with preserved EF, chronic back pain chronic hypertension, and end-stage renal disease on hemodialysis. She has been admitted for ESRD (end stage renal disease) ([place]) [N18.6] Frequent falls [R29.6] Recurrent falls [R29.6] Syncope, unspecified syncope type [R55]  Patient seen and evaluated during hemodialysis   HEMODIALYSIS FLOWSHEET:  Blood Flow Rate (mL/min): 400 mL/min Arterial Pressure (mmHg): -160 mmHg Venous Pressure (mmHg): 120 mmHg Transmembrane Pressure (mmHg): 50 mmHg Ultrafiltration Rate (mL/min): 490 mL/min Dialysate Flow Rate (mL/min): 500 ml/min Conductivity: Machine : 13.8 Conductivity: Machine : 13.8 Dialysis Fluid Bolus: Normal Saline Bolus Amount (mL): 100 mL  No urgent complaints at this time Requests something for itching  Objective:  Vital signs in last 24 hours:  Temp:  [97.7 F (36.5 C)-99 F (37.2 C)] 97.7 F (36.5 C) (11/08 0845) Pulse Rate:  [67-73] 72 (11/08 1045) Resp:  [8-20] 9 (11/08 1045) BP: (74-158)/(45-69) 74/52 (11/08 1045) SpO2:  [89 %-95 %] 92 % (11/08 1045) Weight:  [85 kg] 85 kg (11/08 0900)  Weight change:  Filed Weights   03/15/21 1146 03/16/21 1206 03/21/21 0900  Weight: 81.4 kg 81.4 kg 85 kg    Intake/Output: I/O last 3 completed shifts: In: 480 [P.O.:480] Out: -    Intake/Output this shift:  No intake/output data recorded.  Physical Exam: General: Resting in bed, in no acute distress  Head: Moist oral mucosal membranes  Lungs:  Respirations even,unlabored   Heart: S1S2, no rubs or gallops  Abdomen:  Soft, nontender,non distended  Extremities:  No peripheral edema. LUE erythema, edema  Neurologic: Awake,alert,oriented  Skin: No acute  lesions or rashes  Access: Rt chest Permcath, LUE AVG placed on 06/21/23    Basic Metabolic Panel: Recent Labs  Lab 03/16/21 0656  03/20/21 0912  NA 136 134*  K 4.0 4.5  CL 98 95*  CO2 27 26  GLUCOSE 87 110*  BUN 38* 55*  CREATININE 5.26* 7.17*  CALCIUM 8.7* 8.5*  MG 1.8  --   PHOS  --  7.3*     Liver Function Tests: Recent Labs  Lab 03/20/21 0912  ALBUMIN 2.7*    No results for input(s): LIPASE, AMYLASE in the last 168 hours. No results for input(s): AMMONIA in the last 168 hours.  CBC: Recent Labs  Lab 03/14/21 1309 03/16/21 0656 03/20/21 0912  WBC 5.6 6.2 6.6  HGB 9.8* 9.9* 9.2*  HCT 31.0* 31.5* 29.8*  MCV 98.1 100.6* 102.4*  PLT 257 250 232     Cardiac Enzymes: No results for input(s): CKTOTAL, CKMB, CKMBINDEX, TROPONINI in the last 168 hours.  BNP: Invalid input(s): POCBNP  CBG: Recent Labs  Lab 03/18/21 2239 03/19/21 2103 03/20/21 0802 03/20/21 2105 03/21/21 0726  GLUCAP 103* 91 78 90 82     Microbiology: Results for orders placed or performed during the hospital encounter of 03/07/21  Resp Panel by RT-PCR (Flu A&B, Covid) Nasopharyngeal Swab     Status: None   Collection Time: 03/07/21 12:55 PM   Specimen: Nasopharyngeal Swab; Nasopharyngeal(NP) swabs in vial transport medium  Result Value Ref Range Status   SARS Coronavirus 2 by RT PCR NEGATIVE NEGATIVE Final    Comment: (NOTE) SARS-CoV-2 target nucleic acids are NOT DETECTED.  The SARS-CoV-2 RNA is generally detectable in upper respiratory specimens during the acute phase of infection. The lowest concentration of SARS-CoV-2 viral copies this assay can detect is 138  copies/mL. A negative result does not preclude SARS-Cov-2 infection and should not be used as the sole basis for treatment or other patient management decisions. A negative result may occur with  improper specimen collection/handling, submission of specimen other than nasopharyngeal swab, presence of viral mutation(s) within the areas targeted by this assay, and inadequate number of viral copies(<138 copies/mL). A negative result must be combined  with clinical observations, patient history, and epidemiological information. The expected result is Negative.  Fact Sheet for Patients:  EntrepreneurPulse.com.au  Fact Sheet for Healthcare Providers:  IncredibleEmployment.be  This test is no t yet approved or cleared by the [place] FDA and  has been authorized for detection and/or diagnosis of SARS-CoV-2 by FDA under an Emergency Use Authorization (EUA). This EUA will remain  in effect (meaning this test can be used) for the duration of the COVID-19 declaration under Section 564(b)(1) of the Act, 21 U.S.C.section 360bbb-3(b)(1), unless the authorization is terminated  or revoked sooner.       Influenza A by PCR NEGATIVE NEGATIVE Final   Influenza B by PCR NEGATIVE NEGATIVE Final    Comment: (NOTE) The Xpert Xpress SARS-CoV-2/FLU/RSV plus assay is intended as an aid in the diagnosis of influenza from Nasopharyngeal swab specimens and should not be used as a sole basis for treatment. Nasal washings and aspirates are unacceptable for Xpert Xpress SARS-CoV-2/FLU/RSV testing.  Fact Sheet for Patients: EntrepreneurPulse.com.au  Fact Sheet for Healthcare Providers: IncredibleEmployment.be  This test is not yet approved or cleared by the [place] FDA and has been authorized for detection and/or diagnosis of SARS-CoV-2 by FDA under an Emergency Use Authorization (EUA). This EUA will remain in effect (meaning this test can be used) for the duration of the COVID-19 declaration under Section 564(b)(1) of the Act, 21 U.S.C. section 360bbb-3(b)(1), unless the authorization is terminated or revoked.  Performed at [facility], [address] [place], [place] [address]   MRSA Next Gen by PCR, Nasal     Status: None   Collection Time: 03/13/21  9:07 AM   Specimen: Nasal Mucosa; Nasal Swab  Result Value Ref Range Status   MRSA by PCR Next Gen  NOT DETECTED NOT DETECTED Final    Comment: (NOTE) The GeneXpert MRSA Assay (FDA approved for NASAL specimens only), is one component of a comprehensive MRSA colonization surveillance program. It is not intended to diagnose MRSA infection nor to guide or monitor treatment for MRSA infections. Test performance is not FDA approved in patients less than 18 years old. Performed at [facility], [address] [place], [place] [address]     Coagulation Studies: No results for input(s): LABPROT, INR in the last 72 hours.   Urinalysis: No results for input(s): COLORURINE, LABSPEC, PHURINE, GLUCOSEU, HGBUR, BILIRUBINUR, KETONESUR, PROTEINUR, UROBILINOGEN, NITRITE, LEUKOCYTESUR in the last 72 hours.  Invalid input(s): APPERANCEUR    Imaging: No results found.   Medications:    sodium chloride     sodium chloride      aspirin EC  81 mg Oral Daily   carvedilol  12.5 mg Oral QPM   Chlorhexidine Gluconate Cloth  6 each Topical Daily   diphenhydrAMINE       heparin  25 Units/kg Dialysis Once in dialysis   heparin  5,000 Units Subcutaneous Q8H   insulin aspart  0-5 Units Subcutaneous QHS   insulin aspart  0-6 Units Subcutaneous QAC breakfast   lactulose  15 g Oral Daily   lidocaine  1 patch Transdermal Q24H  midodrine  5 mg Oral TID WC   polyethylene glycol  17 g Oral Daily   senna-docusate  1 tablet Oral BID   traZODone  50 mg Oral QHS   venlafaxine  37.5 mg Oral BID WC   sodium chloride, sodium chloride, alteplase, bisacodyl, guaiFENesin, heparin, lidocaine (PF), lidocaine-prilocaine, morphine injection, ondansetron (ZOFRAN) IV, oxyCODONE, pentafluoroprop-tetrafluoroeth, topiramate  Assessment/ Plan:  [name] is a 65 y.o.  female with past medical history including heart failure with preserved EF, chronic back pain chronic hypertension, and end-stage renal disease on hemodialysis. She has been admitted for ESRD (end stage renal disease) ([place])  [N18.6] Frequent falls [R29.6] Recurrent falls [R29.6] Syncope, unspecified syncope type [R55]  [facility]/TTS/Rt Permcath/84 kg  #End-stage renal disease on hemodialysis.   Receiving scheduled dialysis treatment today.  UF decreased from 2 L to 1.5 L due to hypotension.  Patient awaiting insurance authorization for rehab placement in [place].  Outpatient dialysis clinic arranged in [facility] to provide treatments.     #Anemia of chronic kidney disease Lab Results  Component Value Date   HGB 9.2 (L) 03/20/2021  Hemoglobin at goal   #Secondary Hyperparathyroidism: Lab Results  Component Value Date   PTH 70 (H) 05/12/2020   CALCIUM 8.5 (L) 03/20/2021   PHOS 7.3 (H) 03/20/2021  Will continue monitoring bone mineral metabolism parameters Calcium and phosphorus not at goal.  We will start patient on calcium acetate with meals.  #Chronic diastolic heart failure: ECHO from 03/07/21 shows EF of 60-65%. No acute respiratory distress  #Diabetes with CKD Lab Results  Component Value Date   HGBA1C 5.1 03/07/2021  Glucose stable during admission  # Orthostatic hypotension, Carvedilol 12.5mg  daily. Midodrine 5mg  three times a day, hold if systolic BP greater than 007.  Currently 100/55 during dialysis   LOS: 13 Euan Wandler 11/8/202211:06 AM

## 2021-03-21 NOTE — Progress Notes (Signed)
PT Cancellation Note  Patient Details Name: Katie [name] MRN: [id number] DOB: Oct 17, 1955   Cancelled Treatment:    Reason Eval/Treat Not Completed: Other (comment). Pt currently off unit for HD. Will re-attempt next available time   Destine Zirkle 03/21/2021, 9:15 AM Greggory Stallion, PT, DPT [phone]

## 2021-03-21 NOTE — Progress Notes (Signed)
Katie [name] Daily Progress Note  Asked by patient's nurse to assess the left upper extremity due to some swelling and discomfort.  Physical exam was notable for a patent graft with a bruit and thrill.  The extremity is soft with some swelling which is expected to be due to the nature of the [name].  Incisions are healing well.  Palpable radial pulse.  Hand is warm.  Motor/sensory is intact.  Okay to be discharged to her nursing facility once medically stable Discharge instructions/follow-up have been entered  [facility] PA-C 03/21/2021 12:40 PM

## 2021-03-21 NOTE — Progress Notes (Signed)
Patient tolerated HD session, with periods hypotension, tx with reduction of targeted UF and bolus of NS. Catheter functioned well, maintaining BFR of 400 throughout 3.5 hr session, no clotting noted. Pt slated for d/c today, returned to room.

## 2021-03-21 NOTE — Discharge Summary (Addendum)
Physician Discharge Summary  Katie [name] HQI:[id number] DOB: Jun 13, 1955 DOA: 03/07/2021  PCP: Patient, [name] (Inactive)  Admit date: 03/07/2021 Discharge date: 03/21/2021  Admitted From: Home Disposition: SNF  Recommendations for Outpatient Follow-up:  Follow up with PCP in 1-2 weeks Follow-up outpatient vascular surgery Follow-up outpatient nephrology  Home Health: No Equipment/Devices: None  Discharge Condition: Stable CODE STATUS: Full Diet recommendation: Renal fluid restriction  Brief/Interim Summary: Katie [name] is a 65 y.o. female with medical history significant for hypertension, end-stage renal disease on hemodialysis TuThSa, heart failure preserved ejection fraction, chronic low back pain, presents emergency department for chief concerns of frequent falls and syncope after dialysis.. Found to be hypotensive.  She does have orthostatic hypotension.  Her BP meds were changed and started on midodrine today. PT recommended SNF. Left upper extremity axillary dialysis graft created 11/3.  No complications.  Vascular surgery signed off.  Patient will follow-up in vascular office in 3 weeks to assess graft maturation.  In the meantime we will dialyze via PermCath.  Patient medically stable for discharge.  Concerned about pain at AV graft site.  Evaluated by vascular surgery on day of discharge.  No issues with graft.  Patient to be discharged to skilled nursing facility.  We will follow-up outpatient with PCP, nephrology, vascular surgery.    Discharge Diagnoses:  Active Problems:   Hypertension   Type 2 diabetes mellitus (HCC)   Chronic kidney disease (CKD), stage IV (severe) (HCC)   Pulmonary hypertension, unspecified ([place])   ESRD on dialysis ([place])   Frequent falls   Homelessness   Financial difficulty  Increased fall and weakness Likely secondary to postdialysis treatment and hypotension B12, B1, vitamin D and procalcitonin was ordered for work-up Echo nml EF.   Fall precautions 11/1 PT recommending SNF Outpatient HD changed to [facility] dose decreased Midodrine added Continue therapy evaluations and fall precautions Medically stable for discharge to skilled nursing facility   End-stage renal disease on hemodialysis Nephrology following for inpatient HD needs Outpatient HD chair found in [facility] Left upper extremity axillary graft created 11/3 Plan: Nephrology following for inpatient HD needs Stable for DC from nephrology and vascular standpoint Stable from medical standpoint as well.     Constipation Continue current bowel regimen Patient had BM reported 11/5   Hypertension Orthostatic hypotension Coreg dose decreased Midodrine 5 3 times daily SCDs Avoid hypotension   Depression/anxiety continue venlafaxine    Insomnia trazodone 50 mg nightly    Headaches topiramate 50 mg p.o. twice daily as needed for headaches   History of diabetes type 2 presumed improved with end-stage renal disease BG stable  Discharge Instructions  Discharge Instructions     Diet - low sodium heart healthy   Complete by: As directed    Increase activity slowly   Complete by: As directed    No wound care   Complete by: As directed       Allergies as of 03/21/2021       Reactions   Penicillins Nausea And Vomiting   Pt states "only pills" make her vomit        Medication List     STOP taking these medications    cetirizine 10 MG tablet Commonly known as: ZYRTEC   cyclobenzaprine 5 MG tablet Commonly known as: FLEXERIL   folic acid 1 MG tablet Commonly known as: FOLVITE   oxyCODONE 5 MG immediate release tablet Commonly known as: Roxicodone   torsemide 10 MG tablet Commonly known as: DEMADEX  verapamil 240 MG CR tablet Commonly known as: CALAN-SR       TAKE these medications    acetaminophen 500 MG tablet Commonly known as: TYLENOL Take 500 mg by mouth every 6 (six) hours as needed for moderate  pain.   aspirin 81 MG EC tablet Take 1 tablet (81 mg total) by mouth daily. Swallow whole.   calcium acetate 667 MG capsule Commonly known as: PHOSLO Take 1 capsule (667 mg total) by mouth 3 (three) times daily with meals.   carvedilol 12.5 MG tablet Commonly known as: COREG Take 1 tablet (12.5 mg total) by mouth every evening. What changed:  medication strength how much to take when to take this   lactulose 10 GM/15ML solution Commonly known as: CHRONULAC Take 22.5 mLs (15 g total) by mouth daily.   lidocaine 5 % Commonly known as: LIDODERM Place 1 patch onto the skin daily. Remove & Discard patch within 12 hours or as directed by MD   midodrine 5 MG tablet Commonly known as: PROAMATINE Take 1 tablet (5 mg total) by mouth 3 (three) times daily with meals.   senna-docusate 8.6-50 MG tablet Commonly known as: Senokot-S Take 1 tablet by mouth 2 (two) times daily.   topiramate 50 MG tablet Commonly known as: TOPAMAX Take 1 tablet (50 mg total) by mouth 2 (two) times daily as needed (headache). What changed:  when to take this reasons to take this Another medication with the same name was removed. Continue taking this medication, and follow the directions you see here.   traZODone 50 MG tablet Commonly known as: DESYREL Take 1 tablet (50 mg total) by mouth every evening.   venlafaxine 37.5 MG tablet Commonly known as: EFFEXOR Take 1 tablet (37.5 mg total) by mouth 2 (two) times daily with a meal.        Contact information for follow-up providers     Kris Hartmann, NP Follow up in 2 week(s).   Specialty: Vascular Surgery Why: To see Arna Medici. Consult. First post-op visit. Will need HDA with visit. (left a message for them to call patient) Contact information: [address] [place] [address] [phone]         [facility] Follow up.   Why: April 05, 2021 at 10:30 With Eulogio Ditch NP Contact information: [address] [place] [phone]             Contact information for after-discharge care     Destination     [facility] .   Service: Skilled Nursing Contact information: [place] [address] [phone]                    Allergies  Allergen Reactions   Penicillins Nausea And Vomiting    Pt states "only pills" make her vomit    Consultations: Nephrology Vascular surgery   Procedures/Studies: DG Chest 2 View  Result Date: 03/07/2021 CLINICAL DATA:  Chest pain EXAM: CHEST - 2 VIEW COMPARISON:  Chest x-ray 03/06/2021 FINDINGS: Heart is enlarged. Mediastinum appears stable. Calcified plaques in the aortic arch. Pulmonary vasculature is normal. Right-sided central line is stable. Lungs are hyperinflated with chronic prominent interstitial lung markings/scarring mostly in the lower lung zones. Trace bilateral pleural effusions. No pneumothorax. IMPRESSION: 1. No acute process identified. 2. Cardiomegaly. 3. Chronic changes in the lower lung zones. Electronically Signed   By: Ofilia Neas   On: 03/07/2021 12:48   DG Chest  2 View  Result Date: 03/06/2021 CLINICAL DATA:  Cough.  End stage kidney disease.  Pain EXAM: CHEST - 2 VIEW COMPARISON:  02/02/2021 FINDINGS: There is a right chest wall dialysis catheter with tips in the cavoatrial junction. Heart size normal. Aortic atherosclerotic calcifications. Blunting of the costophrenic angles are noted bilaterally. Similar appearance of mid and lower lung zone scarring. No signs of interstitial edema or airspace consolidation. Visualized osseous structures are unremarkable. IMPRESSION: 1. Small pleural effusions 2. Bilateral lower lung zone predominant pulmonary parenchymal scarring. Electronically Signed   By: Kerby Moors M.D.   On: 03/06/2021 12:54   DG Lumbar Spine 2-3 Views  Result Date: 03/06/2021 CLINICAL DATA:  Back pain EXAM: LUMBAR SPINE - 2-3 VIEW  COMPARISON:  CT abdomen pelvis, 12/12/2020 FINDINGS: No fracture or dislocation of the lumbar spine. Mild multilevel disc space height loss and osteophytosis. Mild multilevel facet degenerative change. Nonobstructive pattern of overlying bowel gas. IMPRESSION: No fracture or dislocation of the lumbar spine. Mild multilevel disc space height loss and osteophytosis. Mild multilevel facet degenerative change. Electronically Signed   By: Delanna Ahmadi M.D.   On: 03/06/2021 12:53   DG Abd 1 View  Result Date: 03/18/2021 CLINICAL DATA:  Abdominal pain and constipation. EXAM: ABDOMEN - 1 VIEW COMPARISON:  None. FINDINGS: The bowel gas pattern is normal. Large amount formed stool throughout the colon. No radio-opaque calculi or other significant radiographic abnormality are seen. IMPRESSION: 1. Nonobstructive bowel gas pattern. 2. Constipation. Electronically Signed   By: Fidela Salisbury M.D.   On: 03/18/2021 20:36   CT HEAD WO CONTRAST (5MM)  Result Date: 03/07/2021 CLINICAL DATA:  Fall, near syncope EXAM: CT HEAD WITHOUT CONTRAST CT CERVICAL SPINE WITHOUT CONTRAST TECHNIQUE: Multidetector CT imaging of the head and cervical spine was performed following the standard protocol without intravenous contrast. Multiplanar CT image reconstructions of the cervical spine were also generated. COMPARISON:  None. FINDINGS: CT HEAD FINDINGS Brain: No evidence of acute infarction, hemorrhage, hydrocephalus, extra-axial collection or mass lesion/mass effect. Mild periventricular white matter hypodensity. 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 Vascular: No hyperdense vessel or unexpected calcification. Skull: Normal. Negative for fracture or focal lesion. Sinuses/Orbits: No acute finding. Other: None. CT CERVICAL SPINE FINDINGS Alignment: Normal. Skull base and vertebrae: No acute fracture. No primary bone lesion or focal pathologic process. Soft tissues and spinal canal: No prevertebral fluid  or swelling. No visible canal hematoma. Disc levels: Focally mild disc space height loss and osteophytosis of C5-C6, with otherwise preserved disc spaces. Upper chest: Negative. Other: None. IMPRESSION: 1. No acute intracranial pathology. Small-vessel white matter disease. 2. No fracture or static subluxation of the cervical spine. Electronically Signed   By: Delanna Ahmadi M.D.   On: 03/07/2021 12:42   CT HEAD WO CONTRAST (5MM)  Result Date: 03/06/2021 CLINICAL DATA:  Severe headache and back spasms. EXAM: CT HEAD WITHOUT CONTRAST TECHNIQUE: Contiguous axial images were obtained from the base of the skull through the vertex without intravenous contrast. COMPARISON:  December 15, 2020 FINDINGS: Brain: No evidence of acute infarction, hemorrhage, hydrocephalus, extra-axial collection or mass lesion/mass effect. Vascular: No hyperdense vessel. Atherosclerotic calcifications of the internal carotid and vertebral arteries at the skull base. Skull: Normal. Negative for fracture or focal lesion. Sinuses/Orbits: Visualized portions of the paranasal sinuses and mastoid air cells are predominantly clear. Orbits are unremarkable. Other: None. IMPRESSION: No acute intracranial abnormality. Electronically Signed   By: Dahlia Bailiff M.D.   On: 03/06/2021 12:29  CT Cervical Spine Wo Contrast  Result Date: 03/07/2021 CLINICAL DATA:  Fall, near syncope EXAM: CT HEAD WITHOUT CONTRAST CT CERVICAL SPINE WITHOUT CONTRAST TECHNIQUE: Multidetector CT imaging of the head and cervical spine was performed following the standard protocol without intravenous contrast. Multiplanar CT image reconstructions of the cervical spine were also generated. COMPARISON:  None. FINDINGS: CT HEAD FINDINGS Brain: No evidence of acute infarction, hemorrhage, hydrocephalus, extra-axial collection or mass lesion/mass effect. Mild periventricular white matter hypodensity. 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 Vascular:  No hyperdense vessel or unexpected calcification. Skull: Normal. Negative for fracture or focal lesion. Sinuses/Orbits: No acute finding. Other: None. CT CERVICAL SPINE FINDINGS Alignment: Normal. Skull base and vertebrae: No acute fracture. No primary bone lesion or focal pathologic process. Soft tissues and spinal canal: No prevertebral fluid or swelling. No visible canal hematoma. Disc levels: Focally mild disc space height loss and osteophytosis of C5-C6, with otherwise preserved disc spaces. Upper chest: Negative. Other: None. IMPRESSION: 1. No acute intracranial pathology. Small-vessel white matter disease. 2. No fracture or static subluxation of the cervical spine. Electronically Signed   By: Delanna Ahmadi M.D.   On: 03/07/2021 12:42   [place] UE VEIN MAPPING LEFT (PRE-OP AVF)  Result Date: 03/13/2021 CLINICAL DATA:  End-stage renal disease. Please perform bilateral upper extremity vein mapping for dialysis access planning purposes. EXAM: [place] EXTREM UP VEIN MAPPING COMPARISON:  None. FINDINGS: RIGHT ARTERIES Wrist Radial Artery: Size 2 mm Waveform Triphasic Wrist Ulnar Artery: Size 2 mm  Waveform Triphasic Prox. Forearm Radial Artery: Size 2 mm  Waveform Triphasic Upper Arm Brachial Artery: Size 64mm  Waveform Triphasic RIGHT VEINS Forearm Cephalic Vein: Prox - too small (sub 2 mm); Distal - too small (sub 2 mm) Prox - too small (sub 2 mm); Distal - too small (sub 2 mm) Upper Arm Basilic Vein: Prox 4 mm Distal 2 mm Depth 6 mm Upper Arm Brachial Vein: Prox - duplicated - 3 & 4 mm; Distal too small Depth 19 and 15 mm respectively ADDITIONAL RIGHT VEINS Axillary Vein: 8 mm Subclavian Vein: Patient: Yes Respiratory Phasicity: Present Internal Jugular Vein: Patent: Yes    Respiratory Phasicity: Present Branches > 2 mm: None _________________________________________________________ LEFT ARTERIES Wrist Radial Artery: Size 2 mm  Waveform Triphasic Wrist Ulnar Artery: Size 2 mm  Waveform Triphasic Prox. Forearm Radial  Artery: Size 2 mm  Waveform Triphasic Upper Arm Brachial Artery: Size 16mm  Waveform Triphasic LEFT VEINS Forearm Cephalic Vein: Prox - too small (sub 2 mm); Distal - too small (sub 2 mm) Upper Arm Cephalic Vein: Prox - too small (sub 2 mm); Distal - too small (sub 2 mm) Upper Arm Basilic Vein: Prox 8 mm Distal 5 mm; Depth 19 mm Upper Arm Brachial Vein: Prox - duplicated - 4 & 2 mm; Distal 2 & 2; depth 21 and 23 mm respectively ADDITIONAL LEFT VEINS Axillary Vein:  14mm Subclavian Vein: Patient: Yes Respiratory Phasicity: Present Internal Jugular Vein: Patent: Yes    Respiratory Phasicity: Present Branches > 2 mm: None _________________________________________________________ No evidence of DVT or SVT within either upper extremity. IMPRESSION: Bilateral upper extremity venous mapping as above. Electronically Signed   By: Sandi Mariscal M.D.   On: 03/13/2021 14:27   [place] UE VEIN MAPPING RIGHT (PRE-OP AVF)  Result Date: 03/13/2021 CLINICAL DATA:  End-stage renal disease. Please perform bilateral upper extremity vein mapping for dialysis access planning purposes. EXAM: [place] EXTREM UP VEIN MAPPING COMPARISON:  None. FINDINGS: RIGHT ARTERIES  Wrist Radial Artery: Size 2 mm Waveform Triphasic Wrist Ulnar Artery: Size 2 mm  Waveform Triphasic Prox. Forearm Radial Artery: Size 2 mm  Waveform Triphasic Upper Arm Brachial Artery: Size 9mm  Waveform Triphasic RIGHT VEINS Forearm Cephalic Vein: Prox - too small (sub 2 mm); Distal - too small (sub 2 mm) Prox - too small (sub 2 mm); Distal - too small (sub 2 mm) Upper Arm Basilic Vein: Prox 4 mm Distal 2 mm Depth 6 mm Upper Arm Brachial Vein: Prox - duplicated - 3 & 4 mm; Distal too small Depth 19 and 15 mm respectively ADDITIONAL RIGHT VEINS Axillary Vein: 8 mm Subclavian Vein: Patient: Yes Respiratory Phasicity: Present Internal Jugular Vein: Patent: Yes    Respiratory Phasicity: Present Branches > 2 mm: None _________________________________________________________ LEFT ARTERIES  Wrist Radial Artery: Size 2 mm  Waveform Triphasic Wrist Ulnar Artery: Size 2 mm  Waveform Triphasic Prox. Forearm Radial Artery: Size 2 mm  Waveform Triphasic Upper Arm Brachial Artery: Size 64mm  Waveform Triphasic LEFT VEINS Forearm Cephalic Vein: Prox - too small (sub 2 mm); Distal - too small (sub 2 mm) Upper Arm Cephalic Vein: Prox - too small (sub 2 mm); Distal - too small (sub 2 mm) Upper Arm Basilic Vein: Prox 8 mm Distal 5 mm; Depth 19 mm Upper Arm Brachial Vein: Prox - duplicated - 4 & 2 mm; Distal 2 & 2; depth 21 and 23 mm respectively ADDITIONAL LEFT VEINS Axillary Vein:  47mm Subclavian Vein: Patient: Yes Respiratory Phasicity: Present Internal Jugular Vein: Patent: Yes    Respiratory Phasicity: Present Branches > 2 mm: None _________________________________________________________ No evidence of DVT or SVT within either upper extremity. IMPRESSION: Bilateral upper extremity venous mapping as above. Electronically Signed   By: Sandi Mariscal M.D.   On: 03/13/2021 14:27   ECHOCARDIOGRAM COMPLETE  Result Date: 03/08/2021    ECHOCARDIOGRAM REPORT   Patient Name:   [facility] Date of Exam: 03/07/2021 Medical Rec #:  [id number]    Height:       66.0 in Accession #:    [id number]   Weight:       173.0 lb Date of Birth:  05-20-1955   BSA:          1.880 m Patient Age:    65 years     BP:           90/53 mmHg Patient Gender: F            HR:           85 bpm. Exam Location:  [facility] Procedure: 2D Echo, Cardiac Doppler and Color Doppler Indications:     R55 Syncope  History:         Patient has prior history of Echocardiogram examinations, most                  recent 05/01/2020. Risk Factors:Hypertension and Diabetes.                  Chronic kidney disease.  Sonographer:     Cresenciano Lick RDCS Referring Phys:  [id number] AMY N COX Diagnosing Phys: Isaias Cowman MD IMPRESSIONS  1. Left ventricular ejection fraction, by estimation, is 60 to 65%. The left ventricle has normal function. The left  ventricle has no regional wall motion abnormalities. There is moderate left ventricular hypertrophy. Left ventricular diastolic parameters are consistent with Grade I diastolic dysfunction (impaired relaxation).  2. Right ventricular systolic function is normal. The right ventricular size is  normal.  3. The mitral valve is normal in structure. Mild mitral valve regurgitation. No evidence of mitral stenosis.  4. The aortic valve is normal in structure. Aortic valve regurgitation is not visualized. Mild aortic valve stenosis.  5. The inferior vena cava is normal in size with greater than 50% respiratory variability, suggesting right atrial pressure of 3 mmHg. FINDINGS  Left Ventricle: Left ventricular ejection fraction, by estimation, is 60 to 65%. The left ventricle has normal function. The left ventricle has no regional wall motion abnormalities. The left ventricular internal cavity size was normal in size. There is  moderate left ventricular hypertrophy. Left ventricular diastolic parameters are consistent with Grade I diastolic dysfunction (impaired relaxation). Right Ventricle: The right ventricular size is normal. No increase in right ventricular wall thickness. Right ventricular systolic function is normal. Left Atrium: Left atrial size was normal in size. Right Atrium: Right atrial size was normal in size. Pericardium: There is no evidence of pericardial effusion. Mitral Valve: The mitral valve is normal in structure. Mild mitral valve regurgitation. No evidence of mitral valve stenosis. Tricuspid Valve: The tricuspid valve is normal in structure. Tricuspid valve regurgitation is mild . No evidence of tricuspid stenosis. Aortic Valve: The aortic valve is normal in structure. Aortic valve regurgitation is not visualized. Mild aortic stenosis is present. Aortic valve mean gradient measures 9.5 mmHg. Aortic valve peak gradient measures 14.9 mmHg. Aortic valve area, by VTI measures 1.46 cm. Pulmonic Valve: The  pulmonic valve was normal in structure. Pulmonic valve regurgitation is not visualized. No evidence of pulmonic stenosis. Aorta: The aortic root is normal in size and structure. Venous: The inferior vena cava is normal in size with greater than 50% respiratory variability, suggesting right atrial pressure of 3 mmHg. IAS/Shunts: No atrial level shunt detected by color flow Doppler.  LEFT VENTRICLE PLAX 2D LVIDd:         2.70 cm   Diastology LVIDs:         1.90 cm   LV e' medial:    5.84 cm/s LV PW:         1.60 cm   LV E/e' medial:  13.7 LV IVS:        1.60 cm   LV e' lateral:   4.30 cm/s LVOT diam:     1.80 cm   LV E/e' lateral: 18.6 LV SV:         54 LV SV Index:   29 LVOT Area:     2.54 cm  RIGHT VENTRICLE RV Basal diam:  4.00 cm RV S prime:     9.57 cm/s TAPSE (M-mode): 2.8 cm LEFT ATRIUM             Index        RIGHT ATRIUM           Index LA diam:        4.00 cm 2.13 cm/m   RA Area:     14.10 cm LA Vol (A2C):   39.7 ml 21.11 ml/m  RA Volume:   37.50 ml  19.94 ml/m LA Vol (A4C):   42.1 ml 22.39 ml/m LA Biplane Vol: 42.6 ml 22.66 ml/m  AORTIC VALVE AV Area (Vmax):    1.35 cm AV Area (Vmean):   1.43 cm AV Area (VTI):     1.46 cm AV Vmax:           192.75 cm/s AV Vmean:          146.000 cm/s AV VTI:  0.372 m AV Peak Grad:      14.9 mmHg AV Mean Grad:      9.5 mmHg LVOT Vmax:         102.50 cm/s LVOT Vmean:        82.100 cm/s LVOT VTI:          0.213 m LVOT/AV VTI ratio: 0.57  AORTA Ao Root diam: 3.70 cm MITRAL VALVE MV Area (PHT): 2.80 cm     SHUNTS MV Decel Time: 271 msec     Systemic VTI:  0.21 m MV E velocity: 79.90 cm/s   Systemic Diam: 1.80 cm MV A velocity: 115.00 cm/s MV E/A ratio:  0.69 Isaias Cowman MD Electronically signed by Isaias Cowman MD Signature Date/Time: 03/08/2021/1:45:22 PM    Final       Subjective: Patient seen and examined the day of discharge.  Stable no distress.  Tolerating dialysis.  Stable for discharge to skilled nursing facility.  Discharge  Exam: Vitals:   03/21/21 1245 03/21/21 1337  BP: (!) 104/56 102/61  Pulse: 72 81  Resp: 13 20  Temp:  98.5 F (36.9 C)  SpO2: 95% 95%   Vitals:   03/21/21 1230 03/21/21 1245 03/21/21 1256 03/21/21 1337  BP: (!) 100/55 (!) 104/56  102/61  Pulse: 72 72  81  Resp: 10 13  20   Temp:    98.5 F (36.9 C)  TempSrc:    Oral  SpO2: 96% 95%  95%  Weight:   83.6 kg   Height:        General: Pt is alert, awake, not in acute distress Cardiovascular: RRR, S1/S2 +, no rubs, no gallops Respiratory: CTA bilaterally, no wheezing, no rhonchi Abdominal: Soft, NT, ND, bowel sounds + Extremities: no edema, no cyanosis    The results of significant diagnostics from this hospitalization (including imaging, microbiology, ancillary and laboratory) are listed below for reference.     Microbiology: Recent Results (from the past 240 hour(s))  MRSA Next Gen by PCR, Nasal     Status: None   Collection Time: 03/13/21  9:07 AM   Specimen: Nasal Mucosa; Nasal Swab  Result Value Ref Range Status   MRSA by PCR Next Gen NOT DETECTED NOT DETECTED Final    Comment: (NOTE) The GeneXpert MRSA Assay (FDA approved for NASAL specimens only), is one component of a comprehensive MRSA colonization surveillance program. It is not intended to diagnose MRSA infection nor to guide or monitor treatment for MRSA infections. Test performance is not FDA approved in patients less than 59 years old. Performed at [facility], [address] [place], [place] [address]   Resp Panel by RT-PCR (Flu A&B, Covid) Nasopharyngeal Swab     Status: None   Collection Time: 03/21/21 12:05 PM   Specimen: Nasopharyngeal Swab; Nasopharyngeal(NP) swabs in vial transport medium  Result Value Ref Range Status   SARS Coronavirus 2 by RT PCR NEGATIVE NEGATIVE Final    Comment: (NOTE) SARS-CoV-2 target nucleic acids are NOT DETECTED.  The SARS-CoV-2 RNA is generally detectable in upper respiratory specimens during the acute  phase of infection. The lowest concentration of SARS-CoV-2 viral copies this assay can detect is 138 copies/mL. A negative result does not preclude SARS-Cov-2 infection and should not be used as the sole basis for treatment or other patient management decisions. A negative result may occur with  improper specimen collection/handling, submission of specimen other than nasopharyngeal swab, presence of viral mutation(s) within the areas targeted by this assay, and  inadequate number of viral copies(<138 copies/mL). A negative result must be combined with clinical observations, patient history, and epidemiological information. The expected result is Negative.  Fact Sheet for Patients:  EntrepreneurPulse.com.au  Fact Sheet for Healthcare Providers:  IncredibleEmployment.be  This test is no t yet approved or cleared by the [place] FDA and  has been authorized for detection and/or diagnosis of SARS-CoV-2 by FDA under an Emergency Use Authorization (EUA). This EUA will remain  in effect (meaning this test can be used) for the duration of the COVID-19 declaration under Section 564(b)(1) of the Act, 21 U.S.C.section 360bbb-3(b)(1), unless the authorization is terminated  or revoked sooner.       Influenza A by PCR NEGATIVE NEGATIVE Final   Influenza B by PCR NEGATIVE NEGATIVE Final    Comment: (NOTE) The Xpert Xpress SARS-CoV-2/FLU/RSV plus assay is intended as an aid in the diagnosis of influenza from Nasopharyngeal swab specimens and should not be used as a sole basis for treatment. Nasal washings and aspirates are unacceptable for Xpert Xpress SARS-CoV-2/FLU/RSV testing.  Fact Sheet for Patients: EntrepreneurPulse.com.au  Fact Sheet for Healthcare Providers: IncredibleEmployment.be  This test is not yet approved or cleared by the [place] FDA and has been authorized for detection and/or diagnosis of  SARS-CoV-2 by FDA under an Emergency Use Authorization (EUA). This EUA will remain in effect (meaning this test can be used) for the duration of the COVID-19 declaration under Section 564(b)(1) of the Act, 21 U.S.C. section 360bbb-3(b)(1), unless the authorization is terminated or revoked.  Performed at [facility], [address] [place], [place] [address]      Labs: BNP (last 3 results) Recent Labs    04/30/20 1601 03/07/21 1114  BNP 769.6* 60.4   Basic Metabolic Panel: Recent Labs  Lab 03/16/21 0656 03/20/21 0912  NA 136 134*  K 4.0 4.5  CL 98 95*  CO2 27 26  GLUCOSE 87 110*  BUN 38* 55*  CREATININE 5.26* 7.17*  CALCIUM 8.7* 8.5*  MG 1.8  --   PHOS  --  7.3*   Liver Function Tests: Recent Labs  Lab 03/20/21 0912  ALBUMIN 2.7*   No results for input(s): LIPASE, AMYLASE in the last 168 hours. No results for input(s): AMMONIA in the last 168 hours. CBC: Recent Labs  Lab 03/16/21 0656 03/20/21 0912  WBC 6.2 6.6  HGB 9.9* 9.2*  HCT 31.5* 29.8*  MCV 100.6* 102.4*  PLT 250 232   Cardiac Enzymes: No results for input(s): CKTOTAL, CKMB, CKMBINDEX, TROPONINI in the last 168 hours. BNP: Invalid input(s): POCBNP CBG: Recent Labs  Lab 03/18/21 2239 03/19/21 2103 03/20/21 0802 03/20/21 2105 03/21/21 0726  GLUCAP 103* 91 78 90 77   D-Dimer No results for input(s): DDIMER in the last 72 hours. Hgb A1c No results for input(s): HGBA1C in the last 72 hours. Lipid Profile No results for input(s): CHOL, HDL, LDLCALC, TRIG, CHOLHDL, LDLDIRECT in the last 72 hours. Thyroid function studies No results for input(s): TSH, T4TOTAL, T3FREE, THYROIDAB in the last 72 hours.  Invalid input(s): FREET3 Anemia work up No results for input(s): VITAMINB12, FOLATE, FERRITIN, TIBC, IRON, RETICCTPCT in the last 72 hours. Urinalysis    Component Value Date/Time   COLORURINE YELLOW (A) 05/03/2020 1158   APPEARANCEUR CLEAR (A) 05/03/2020 1158   LABSPEC 1.011  05/03/2020 1158   PHURINE 5.0 05/03/2020 1158   GLUCOSEU 50 (A) 05/03/2020 1158   HGBUR SMALL (A) 05/03/2020 1158   BILIRUBINUR NEGATIVE 05/03/2020 1158  KETONESUR NEGATIVE 05/03/2020 1158   PROTEINUR >=300 (A) 05/03/2020 1158   NITRITE NEGATIVE 05/03/2020 1158   LEUKOCYTESUR NEGATIVE 05/03/2020 1158   Sepsis Labs Invalid input(s): PROCALCITONIN,  WBC,  LACTICIDVEN Microbiology Recent Results (from the past 240 hour(s))  MRSA Next Gen by PCR, Nasal     Status: None   Collection Time: 03/13/21  9:07 AM   Specimen: Nasal Mucosa; Nasal Swab  Result Value Ref Range Status   MRSA by PCR Next Gen NOT DETECTED NOT DETECTED Final    Comment: (NOTE) The GeneXpert MRSA Assay (FDA approved for NASAL specimens only), is one component of a comprehensive MRSA colonization surveillance program. It is not intended to diagnose MRSA infection nor to guide or monitor treatment for MRSA infections. Test performance is not FDA approved in patients less than 22 years old. Performed at [facility], [address] [place], [place] [address]   Resp Panel by RT-PCR (Flu A&B, Covid) Nasopharyngeal Swab     Status: None   Collection Time: 03/21/21 12:05 PM   Specimen: Nasopharyngeal Swab; Nasopharyngeal(NP) swabs in vial transport medium  Result Value Ref Range Status   SARS Coronavirus 2 by RT PCR NEGATIVE NEGATIVE Final    Comment: (NOTE) SARS-CoV-2 target nucleic acids are NOT DETECTED.  The SARS-CoV-2 RNA is generally detectable in upper respiratory specimens during the acute phase of infection. The lowest concentration of SARS-CoV-2 viral copies this assay can detect is 138 copies/mL. A negative result does not preclude SARS-Cov-2 infection and should not be used as the sole basis for treatment or other patient management decisions. A negative result may occur with  improper specimen collection/handling, submission of specimen other than nasopharyngeal swab, presence of viral  mutation(s) within the areas targeted by this assay, and inadequate number of viral copies(<138 copies/mL). A negative result must be combined with clinical observations, patient history, and epidemiological information. The expected result is Negative.  Fact Sheet for Patients:  EntrepreneurPulse.com.au  Fact Sheet for Healthcare Providers:  IncredibleEmployment.be  This test is no t yet approved or cleared by the [place] FDA and  has been authorized for detection and/or diagnosis of SARS-CoV-2 by FDA under an Emergency Use Authorization (EUA). This EUA will remain  in effect (meaning this test can be used) for the duration of the COVID-19 declaration under Section 564(b)(1) of the Act, 21 U.S.C.section 360bbb-3(b)(1), unless the authorization is terminated  or revoked sooner.       Influenza A by PCR NEGATIVE NEGATIVE Final   Influenza B by PCR NEGATIVE NEGATIVE Final    Comment: (NOTE) The Xpert Xpress SARS-CoV-2/FLU/RSV plus assay is intended as an aid in the diagnosis of influenza from Nasopharyngeal swab specimens and should not be used as a sole basis for treatment. Nasal washings and aspirates are unacceptable for Xpert Xpress SARS-CoV-2/FLU/RSV testing.  Fact Sheet for Patients: EntrepreneurPulse.com.au  Fact Sheet for Healthcare Providers: IncredibleEmployment.be  This test is not yet approved or cleared by the [place] FDA and has been authorized for detection and/or diagnosis of SARS-CoV-2 by FDA under an Emergency Use Authorization (EUA). This EUA will remain in effect (meaning this test can be used) for the duration of the COVID-19 declaration under Section 564(b)(1) of the Act, 21 U.S.C. section 360bbb-3(b)(1), unless the authorization is terminated or revoked.  Performed at [facility], [address] [place], [place] [address]      Time coordinating  discharge: Over 30 minutes  SIGNED:   Sidney Ace, MD  [facility] 03/21/2021, 1:54 PM Pager   If 7PM-7AM, please contact night-coverage

## 2021-03-22 DIAGNOSIS — T7840XA Allergy, unspecified, initial encounter: Secondary | ICD-10-CM | POA: Insufficient documentation

## 2021-03-22 DIAGNOSIS — T782XXA Anaphylactic shock, unspecified, initial encounter: Secondary | ICD-10-CM | POA: Insufficient documentation

## 2021-03-24 NOTE — Anesthesia Postprocedure Evaluation (Signed)
Anesthesia Post Note  Patient: Katie [name]  Procedure(s) Performed: INSERTION OF ARTERIOVENOUS (AV) GORE-TEX GRAFT ARM (Left: Arm Lower)  Patient location during evaluation: PACU Anesthesia Type: General Level of consciousness: awake Pain management: pain level controlled Vital Signs Assessment: post-procedure vital signs reviewed and stable Respiratory status: spontaneous breathing and respiratory function stable Cardiovascular status: blood pressure returned to baseline Anesthetic complications: no   No notable events documented.   Last Vitals:  Vitals:   03/21/21 1245 03/21/21 1337  BP: (!) 104/56 102/61  Pulse: 72 81  Resp: 13 20  Temp:  36.9 C  SpO2: 95% 95%    Last Pain:  Vitals:   03/21/21 1337  TempSrc: Oral  PainSc:                  VAN STAVEREN,Chade Pitner

## 2021-03-27 DIAGNOSIS — D509 Iron deficiency anemia, unspecified: Secondary | ICD-10-CM | POA: Insufficient documentation

## 2021-03-28 ENCOUNTER — Encounter: Payer: Self-pay | Admitting: Vascular Surgery

## 2021-04-04 ENCOUNTER — Other Ambulatory Visit (INDEPENDENT_AMBULATORY_CARE_PROVIDER_SITE_OTHER): Payer: Self-pay | Admitting: Vascular Surgery

## 2021-04-04 DIAGNOSIS — Z95828 Presence of other vascular implants and grafts: Secondary | ICD-10-CM

## 2021-04-04 DIAGNOSIS — N186 End stage renal disease: Secondary | ICD-10-CM

## 2021-04-05 ENCOUNTER — Encounter (INDEPENDENT_AMBULATORY_CARE_PROVIDER_SITE_OTHER): Payer: Medicaid Other

## 2021-04-05 ENCOUNTER — Encounter (INDEPENDENT_AMBULATORY_CARE_PROVIDER_SITE_OTHER): Payer: Medicaid Other | Admitting: Nurse Practitioner

## 2021-04-10 NOTE — ED Provider Notes (Signed)
[facility] Emergency Department Provider Note ____________________________________________  Time seen: 1315  I have reviewed the triage vital signs and the nursing notes.  HISTORY  Chief Complaint  Flank Pain and Medication Refill  HPI Katie [name] is a 65 y.o. female presents to the ED from home with right-sided flank pain.  Patient gives a history of stage IV renal disease.  She did have dialysis this morning.  Reports request for her pain medicine.  She has been referred from her PCP to a pain medicine specialist, but is unable to do things at this time.  Denies any fevers, chills, sweats, chest pain, shortness of breath.  Denies any nausea, vomiting, dizziness.  Patient reports the pain that she experiences at this point is consistent with her baseline pain.  Past Medical History:  Diagnosis Date   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[place])    Hypertension    Renal disorder    Type 2 diabetes mellitus ([place])     Patient Active Problem List   Diagnosis Date Noted   Frequent falls 03/07/2021   Homelessness 03/07/2021   Financial difficulty 03/07/2021   ESRD on dialysis ([place]) 12/23/2020   Persistent headaches 06/27/2020   ATN (acute tubular necrosis) ([place])    Pulmonary hypertension, unspecified (HCC)    Chronic kidney disease (CKD), stage IV (severe) (HCC)    Pleural effusion    Acute heart failure with preserved ejection fraction (HFpEF) ([place])    Pressure injury of skin 05/01/2020   Hyperkalemia    Volume overload 04/30/2020   Type 2 diabetes mellitus with stage 5 chronic kidney disease ([place]) 04/30/2020   Hypertension    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[place])    Type 2 diabetes mellitus ([place])     Past Surgical History:  Procedure Laterality Date   APPENDECTOMY     AV FISTULA PLACEMENT Left 03/16/2021   Procedure: INSERTION OF ARTERIOVENOUS (AV) GORE-TEX GRAFT ARM;  Surgeon: Algernon Huxley, MD;  Location: [facility] ORS;  Service:  Vascular;  Laterality: Left;   DIALYSIS/PERMA CATHETER INSERTION N/A 05/16/2020   Procedure: DIALYSIS/PERMA CATHETER INSERTION;  Surgeon: Algernon Huxley, MD;  Location: [facility];  Service: Cardiovascular;  Laterality: N/A;   MANDIBLE SURGERY     RENAL BIOPSY Right    RIGHT HEART CATH N/A 05/10/2020   Procedure: RIGHT HEART CATH;  Surgeon: Nelva Bush, MD;  Location: [facility];  Service: Cardiovascular;  Laterality: N/A;   TEMPORARY DIALYSIS CATHETER N/A 05/11/2020   Procedure: TEMPORARY DIALYSIS CATHETER;  Surgeon: Algernon Huxley, MD;  Location: [facility];  Service: Cardiovascular;  Laterality: N/A;    Prior to Admission medications   Medication Sig Start Date End Date Taking? Authorizing Provider  acetaminophen (TYLENOL) 500 MG tablet Take 500 mg by mouth every 6 (six) hours as needed for moderate pain.    [provider]  aspirin EC 81 MG EC tablet Take 1 tablet (81 mg total) by mouth daily. Swallow whole. 03/21/21   Sidney Ace, MD  calcium acetate (PHOSLO) 667 MG capsule Take 1 capsule (667 mg total) by mouth 3 (three) times daily with meals. 03/21/21   Sidney Ace, MD  carvedilol (COREG) 12.5 MG tablet Take 1 tablet (12.5 mg total) by mouth every evening. 03/21/21   Sidney Ace, MD  lactulose (CHRONULAC) 10 GM/15ML solution Take 22.5 mLs (15 g total) by mouth daily. 03/21/21   Sreenath,  Sudheer B, MD  lidocaine (LIDODERM) 5 % Place 1 patch onto the skin daily. Remove & Discard patch within 12 hours or as directed by MD 03/21/21   Sidney Ace, MD  midodrine (PROAMATINE) 5 MG tablet Take 1 tablet (5 mg total) by mouth 3 (three) times daily with meals. 03/21/21   Sreenath, Trula Slade, MD  senna-docusate (SENOKOT-S) 8.6-50 MG tablet Take 1 tablet by mouth 2 (two) times daily. 03/21/21   Sidney Ace, MD  topiramate (TOPAMAX) 50 MG tablet Take 1 tablet (50 mg total) by mouth 2 (two) times daily as needed (headache). 03/21/21    Sidney Ace, MD  traZODone (DESYREL) 50 MG tablet Take 1 tablet (50 mg total) by mouth every evening. 07/02/20 08/01/20  Nolberto Hanlon, MD  venlafaxine (EFFEXOR) 37.5 MG tablet Take 1 tablet (37.5 mg total) by mouth 2 (two) times daily with a meal. 07/02/20 08/01/20  Nolberto Hanlon, MD    Allergies Penicillins  Family History  Problem Relation Age of Onset   Heart attack Mother    Asthma Mother    Uterine cancer Mother    Skin cancer Father    Prostate cancer Father    CAD Father    Parkinson's disease Father    Lung cancer Brother     Social History Social History   Tobacco Use   Smoking status: Former   Smokeless tobacco: Never  Scientific laboratory technician Use: Never used  Substance Use Topics   Alcohol use: Not Currently    Review of Systems  Constitutional: Negative for fever. Eyes: Negative for visual changes. ENT: Negative for sore throat. Cardiovascular: Negative for chest pain. Respiratory: Negative for shortness of breath. Gastrointestinal: Negative for abdominal pain, vomiting and diarrhea.  Right-sided flank pain as above. Genitourinary: Negative for dysuria, hematuria, or urinary retention. Musculoskeletal: Negative for back pain. Skin: Negative for rash. Neurological: Negative for headaches, focal weakness or numbness. ____________________________________________  PHYSICAL EXAM:  VITAL SIGNS: ED Triage Vitals  Enc Vitals Group     BP 02/11/21 1158 (!) 175/81     Pulse Rate 02/11/21 1158 (!) 105     Resp 02/11/21 1158 18     Temp 02/11/21 1158 98.5 F (36.9 C)     Temp Source 02/11/21 1158 Oral     SpO2 02/11/21 1158 97 %     Weight 02/11/21 1157 173 lb (78.5 kg)     Height 02/11/21 1157 5\' 6"  (1.676 m)     Head Circumference --      Peak Flow --      Pain Score 02/11/21 1157 9     Pain Loc --      Pain Edu? --      Excl. in [place]? --     Constitutional: Alert and oriented. Well appearing and in no distress. Head: Normocephalic and  atraumatic. Eyes: Conjunctivae are normal. Normal extraocular movements Cardiovascular: Normal rate, regular rhythm. Normal distal pulses. Respiratory: Normal respiratory effort. No wheezes/rales/rhonchi. Gastrointestinal: Soft and nontender. No distention.  Right sided flank pain as above Musculoskeletal: Nontender with normal range of motion in all extremities.  Neurologic:  Normal gait without ataxia. Normal speech and language. No gross focal neurologic deficits are appreciated. Skin:  Skin is warm, dry and intact. No rash noted. Psychiatric: Mood and affect are normal. Patient exhibits appropriate insight and judgment. ____________________________________________    {LABS (pertinent positives/negatives)  Labs Reviewed - No data to display ____________________________________________  {EKG  ____________________________________________   RADIOLOGY  Official radiology report(s): No results found. ____________________________________________  PROCEDURES  Oxycodone 5 mg IR p.o. Cyclobenzaprine 10 mg p.o.  Procedures ____________________________________________   INITIAL IMPRESSION / ASSESSMENT AND PLAN / ED COURSE  As part of my medical decision making, I reviewed the following data within the electronic MEDICAL RECORD NUMBER Notes from prior ED visits and [place] Controlled Substance Database  Patient with history of CKD, ESRD on dialysis, chronic pain presents to the ED for chronic pain.  Patient presenting with request for pain medicine.  He denies any symptoms at this time including hematuria, dysuria, urinary retention patient denies any fever, chills, sweats.    Patient stable and without complaint at this time other than requested pain medicine.  A prescription for Flexeril is present provided for her benefit.  She is given as needed also medication for acute on chronic pain, and is referred to her primary provider and pain management specialist.  Vianney Kopecky was evaluated in  Emergency Department on 04/10/2021 for the symptoms described in the history of present illness. She was evaluated in the context of the global COVID-19 pandemic, which necessitated consideration that the patient might be at risk for infection with the SARS-CoV-2 virus that causes COVID-19. Institutional protocols and algorithms that pertain to the evaluation of patients at risk for COVID-19 are in a state of rapid change based on information released by regulatory bodies including the CDC and federal and state organizations. These policies and algorithms were followed during the patient's care in the ED.  I reviewed the patient's prescription history over the last 12 months in the multi-state controlled substances database(s) that includes [place], [place], [place], [place], [place], [place], [place], [place], [place], [place], [place], [place], [place], and [place].  Results were notable for no current RX. ____________________________________________  FINAL CLINICAL IMPRESSION(S) / ED DIAGNOSES  Final diagnoses:  Chronic pain syndrome      Melvenia Needles, PA-C 04/10/21 1559    Arta Silence, MD 04/12/21 1624

## 2021-05-03 ENCOUNTER — Other Ambulatory Visit: Payer: Self-pay

## 2021-05-03 ENCOUNTER — Ambulatory Visit (INDEPENDENT_AMBULATORY_CARE_PROVIDER_SITE_OTHER): Payer: Medicaid Other | Admitting: Nurse Practitioner

## 2021-05-03 ENCOUNTER — Ambulatory Visit (INDEPENDENT_AMBULATORY_CARE_PROVIDER_SITE_OTHER): Payer: Medicaid Other

## 2021-05-03 VITALS — BP 155/78 | HR 78 | Ht 66.0 in | Wt 185.0 lb

## 2021-05-03 DIAGNOSIS — Z95828 Presence of other vascular implants and grafts: Secondary | ICD-10-CM | POA: Diagnosis not present

## 2021-05-03 DIAGNOSIS — I10 Essential (primary) hypertension: Secondary | ICD-10-CM

## 2021-05-03 DIAGNOSIS — N186 End stage renal disease: Secondary | ICD-10-CM | POA: Diagnosis not present

## 2021-05-03 DIAGNOSIS — E1122 Type 2 diabetes mellitus with diabetic chronic kidney disease: Secondary | ICD-10-CM

## 2021-05-03 DIAGNOSIS — Z992 Dependence on renal dialysis: Secondary | ICD-10-CM

## 2021-05-15 ENCOUNTER — Encounter (INDEPENDENT_AMBULATORY_CARE_PROVIDER_SITE_OTHER): Payer: Self-pay | Admitting: Nurse Practitioner

## 2021-05-15 NOTE — Progress Notes (Signed)
Subjective:    Patient ID: Katie [name], female    DOB: 1956-03-27, 66 y.o.   MRN: [id number] Chief Complaint  Patient presents with   Follow-up    2 wk FU with HDA     Katie [name] is a 66 year old female that returns to the office for followup of their dialysis access. The function of the access has been stable. The patient denies increased bleeding time or increased recirculation. Patient denies difficulty with cannulation. The patient denies hand pain or other symptoms consistent with steal phenomena.  No significant arm swelling.  The patient denies redness or swelling at the access site. The patient denies fever or chills at home or while on dialysis.  The patient denies amaurosis fugax or recent TIA symptoms. There are no recent neurological changes noted. The patient denies claudication symptoms or rest pain symptoms. The patient denies history of DVT, PE or superficial thrombophlebitis. The patient denies recent episodes of angina or shortness of breath.   The patient has a flow volume of 2153.  There is some velocity increase in the outflow vein near the anastomosis however it appears due to vessel diameter change as well as angle of the vessel takeoff.       Review of Systems  Hematological:  Does not bruise/bleed easily.  All other systems reviewed and are negative.     Objective:   Physical Exam Vitals reviewed.  HENT:     Head: Normocephalic.  Cardiovascular:     Rate and Rhythm: Normal rate.     Pulses:          Radial pulses are 2+ on the left side.     Arteriovenous access: Left arteriovenous access is present.    Comments: Good thrill and bruit left brachial axillary AV graft Pulmonary:     Effort: Pulmonary effort is normal.  Skin:    General: Skin is warm and dry.  Neurological:     Mental Status: She is alert and oriented to person, place, and time.  Psychiatric:        Mood and Affect: Mood normal.        Behavior: Behavior normal.        Thought  Content: Thought content normal.        Judgment: Judgment normal.    BP (!) 155/78    Pulse 78    Ht 5\' 6"  (1.676 m)    Wt 185 lb (83.9 kg)    BMI 29.86 kg/m   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)    Hypertension    Renal disorder    Type 2 diabetes mellitus ([place])     Social History   Socioeconomic History   Marital status: Single    Spouse name: Not on file   Number of children: Not on file   Years of education: Not on file   Highest education level: Not on file  Occupational History   Not on file  Tobacco Use   Smoking status: Former   Smokeless tobacco: Never  Vaping Use   Vaping Use: Never used  Substance and Sexual Activity   Alcohol use: Not Currently   Drug use: Not on file   Sexual activity: Not on file  Other Topics Concern   Not on file  Social History Narrative   Not on file   Social Determinants of Health   Financial Resource Strain: Not on file  Food Insecurity:  Not on file  Transportation Needs: Not on file  Physical Activity: Not on file  Stress: Not on file  Social Connections: Not on file  Intimate Partner Violence: Not on file    Past Surgical History:  Procedure Laterality Date   APPENDECTOMY     AV FISTULA PLACEMENT Left 03/16/2021   Procedure: INSERTION OF ARTERIOVENOUS (AV) GORE-TEX GRAFT ARM;  Surgeon: Algernon Huxley, MD;  Location: [facility] ORS;  Service: Vascular;  Laterality: Left;   DIALYSIS/PERMA CATHETER INSERTION N/A 05/16/2020   Procedure: DIALYSIS/PERMA CATHETER INSERTION;  Surgeon: Algernon Huxley, MD;  Location: [facility];  Service: Cardiovascular;  Laterality: N/A;   MANDIBLE SURGERY     RENAL BIOPSY Right    RIGHT HEART CATH N/A 05/10/2020   Procedure: RIGHT HEART CATH;  Surgeon: Nelva Bush, MD;  Location: [facility];  Service: Cardiovascular;  Laterality: N/A;   TEMPORARY DIALYSIS CATHETER N/A 05/11/2020   Procedure: TEMPORARY DIALYSIS CATHETER;   Surgeon: Algernon Huxley, MD;  Location: [facility];  Service: Cardiovascular;  Laterality: N/A;    Family History  Problem Relation Age of Onset   Heart attack Mother    Asthma Mother    Uterine cancer Mother    Skin cancer Father    Prostate cancer Father    CAD Father    Parkinson's disease Father    Lung cancer Brother     Allergies  Allergen Reactions   Penicillins Nausea And Vomiting    Pt states "only pills" make her vomit    CBC Latest Ref Rng & Units 03/20/2021 03/16/2021 03/14/2021  WBC 4.0 - 10.5 K/uL 6.6 6.2 5.6  Hemoglobin 12.0 - 15.0 g/dL 9.2(L) 9.9(L) 9.8(L)  Hematocrit 36.0 - 46.0 % 29.8(L) 31.5(L) 31.0(L)  Platelets 150 - 400 K/uL 232 250 257      CMP     Component Value Date/Time   NA 134 (L) 03/20/2021 0912   K 4.5 03/20/2021 0912   CL 95 (L) 03/20/2021 0912   CO2 26 03/20/2021 0912   GLUCOSE 110 (H) 03/20/2021 0912   BUN 55 (H) 03/20/2021 0912   CREATININE 7.17 (H) 03/20/2021 0912   CALCIUM 8.5 (L) 03/20/2021 0912   PROT 7.5 03/07/2021 1114   ALBUMIN 2.7 (L) 03/20/2021 0912   AST 22 03/07/2021 1114   ALT 20 03/07/2021 1114   ALKPHOS 94 03/07/2021 1114   BILITOT 0.9 03/07/2021 1114   GFRNONAA 6 (L) 03/20/2021 0912   GFRAA  01/19/2021 0833    SPECIMEN HEMOLYZED. HEMOLYSIS MAY AFFECT INTEGRITY OF RESULTS.     No results found.     Assessment & Plan:   1. ESRD (end stage renal disease) ([place]) Recommend:  The patient is doing well and currently has adequate dialysis access. The patient's dialysis center is not reporting any access issues. Flow pattern is stable when compared to the prior ultrasound.  The patient should have a duplex ultrasound of the dialysis access in 6 months. The patient will follow-up with me in the office after each ultrasound     2. Primary hypertension Continue antihypertensive medications as already ordered, these medications have been reviewed and there are no changes at this time.   3. Type 2 diabetes  mellitus with chronic kidney disease on chronic dialysis, without long-term current use of insulin (HCC) Continue hypoglycemic medications as already ordered, these medications have been reviewed and there are no changes at this time.  Hgb A1C to be monitored as already  arranged by primary service    Current Outpatient Medications on File Prior to Visit  Medication Sig Dispense Refill   acetaminophen (TYLENOL) 500 MG tablet Take 500 mg by mouth every 6 (six) hours as needed for moderate pain.     aspirin EC 81 MG EC tablet Take 1 tablet (81 mg total) by mouth daily. Swallow whole. 30 tablet 11   calcium acetate (PHOSLO) 667 MG capsule Take 1 capsule (667 mg total) by mouth 3 (three) times daily with meals.     carvedilol (COREG) 12.5 MG tablet Take 1 tablet (12.5 mg total) by mouth every evening.     lactulose (CHRONULAC) 10 GM/15ML solution Take 22.5 mLs (15 g total) by mouth daily. 236 mL 0   lidocaine (LIDODERM) 5 % Place 1 patch onto the skin daily. Remove & Discard patch within 12 hours or as directed by MD 30 patch 0   midodrine (PROAMATINE) 5 MG tablet Take 1 tablet (5 mg total) by mouth 3 (three) times daily with meals.     senna-docusate (SENOKOT-S) 8.6-50 MG tablet Take 1 tablet by mouth 2 (two) times daily.     topiramate (TOPAMAX) 50 MG tablet Take 1 tablet (50 mg total) by mouth 2 (two) times daily as needed (headache).     traZODone (DESYREL) 50 MG tablet Take 1 tablet (50 mg total) by mouth every evening. 30 tablet 0   venlafaxine (EFFEXOR) 37.5 MG tablet Take 1 tablet (37.5 mg total) by mouth 2 (two) times daily with a meal. 60 tablet 0   No current facility-administered medications on file prior to visit.    There are no Patient Instructions on file for this visit. No follow-ups on file.   Kris Hartmann, NP

## 2021-05-31 ENCOUNTER — Encounter: Payer: Self-pay | Admitting: Vascular Surgery

## 2021-06-01 ENCOUNTER — Encounter: Payer: Self-pay | Admitting: Emergency Medicine

## 2021-06-01 ENCOUNTER — Emergency Department
Admission: EM | Admit: 2021-06-01 | Discharge: 2021-06-01 | Disposition: A | Discharge status: Home / Self Care | Payer: Medicaid Other | Attending: Emergency Medicine | Admitting: Emergency Medicine

## 2021-06-01 ENCOUNTER — Other Ambulatory Visit: Payer: Self-pay

## 2021-06-01 ENCOUNTER — Emergency Department: Payer: Medicaid Other

## 2021-06-01 DIAGNOSIS — I959 Hypotension, unspecified: Secondary | ICD-10-CM | POA: Diagnosis not present

## 2021-06-01 DIAGNOSIS — E119 Type 2 diabetes mellitus without complications: Secondary | ICD-10-CM | POA: Insufficient documentation

## 2021-06-01 DIAGNOSIS — R059 Cough, unspecified: Secondary | ICD-10-CM | POA: Insufficient documentation

## 2021-06-01 DIAGNOSIS — Z992 Dependence on renal dialysis: Secondary | ICD-10-CM | POA: Insufficient documentation

## 2021-06-01 DIAGNOSIS — N186 End stage renal disease: Secondary | ICD-10-CM | POA: Insufficient documentation

## 2021-06-01 DIAGNOSIS — I120 Hypertensive chronic kidney disease with stage 5 chronic kidney disease or end stage renal disease: Secondary | ICD-10-CM | POA: Diagnosis not present

## 2021-06-01 DIAGNOSIS — R42 Dizziness and giddiness: Secondary | ICD-10-CM | POA: Diagnosis present

## 2021-06-01 DIAGNOSIS — I951 Orthostatic hypotension: Secondary | ICD-10-CM

## 2021-06-01 LAB — BASIC METABOLIC PANEL
Anion gap: 8 (ref 5–15)
BUN: 16 mg/dL (ref 8–23)
CO2: 28 mmol/L (ref 22–32)
Calcium: 8.2 mg/dL — ABNORMAL LOW (ref 8.9–10.3)
Chloride: 103 mmol/L (ref 98–111)
Creatinine, Ser: 2.74 mg/dL — ABNORMAL HIGH (ref 0.44–1.00)
GFR, Estimated: 19 mL/min — ABNORMAL LOW (ref >60)
Glucose, Bld: 100 mg/dL — ABNORMAL HIGH (ref 70–99)
Potassium: 3.9 mmol/L (ref 3.5–5.1)
Sodium: 139 mmol/L (ref 135–145)

## 2021-06-01 LAB — CBC WITH DIFFERENTIAL/PLATELET
Abs Immature Granulocytes: 0.03 10*3/uL (ref 0.00–0.07)
Basophils Absolute: 0 10*3/uL (ref 0.0–0.1)
Basophils Relative: 1 %
Eosinophils Absolute: 0.1 10*3/uL (ref 0.0–0.5)
Eosinophils Relative: 1 %
HCT: 30 % — ABNORMAL LOW (ref 36.0–46.0)
Hemoglobin: 9.4 g/dL — ABNORMAL LOW (ref 12.0–15.0)
Immature Granulocytes: 0 %
Lymphocytes Relative: 15 %
Lymphs Abs: 1 10*3/uL (ref 0.7–4.0)
MCH: 30.7 pg (ref 26.0–34.0)
MCHC: 31.3 g/dL (ref 30.0–36.0)
MCV: 98 fL (ref 80.0–100.0)
Monocytes Absolute: 0.8 10*3/uL (ref 0.1–1.0)
Monocytes Relative: 12 %
Neutro Abs: 5.1 10*3/uL (ref 1.7–7.7)
Neutrophils Relative %: 71 %
Platelets: 254 10*3/uL (ref 150–400)
RBC: 3.06 MIL/uL — ABNORMAL LOW (ref 3.87–5.11)
RDW: 14.1 % (ref 11.5–15.5)
WBC: 7 10*3/uL (ref 4.0–10.5)
nRBC: 0 % (ref 0.0–0.2)

## 2021-06-01 MED ORDER — BUTALBITAL-APAP-CAFFEINE 50-325-40 MG PO TABS
2.0000 | ORAL_TABLET | Freq: Once | ORAL | Status: AC
  Administered 2021-06-01: 2 via ORAL
  Filled 2021-06-01: qty 2

## 2021-06-01 MED ORDER — SODIUM CHLORIDE 0.9 % IV BOLUS
500.0000 mL | Freq: Once | INTRAVENOUS | Status: AC
  Administered 2021-06-01: 500 mL via INTRAVENOUS

## 2021-06-01 NOTE — ED Triage Notes (Signed)
Pt to ED via [facility] with c/o hypotension after dialysis treatment. Pt became dizzy after treatment, they took her B/P and it was 95/54 they called 911. EMS one pt was standing got 54/31. Pt is currently A&O x 4. Currently has a headache.

## 2021-06-01 NOTE — ED Provider Notes (Signed)
[facility] Provider Note    Event Date/Time   First MD Initiated Contact with Patient 06/01/21 1203     (approximate)   History   Hypotension   HPI  Katie [name] is a 66 y.o. female who presents to the ED for evaluation of Hypotension    Review outpatient vascular surgery visit from 12/21.  History of ESRD on hemodialysis.History of hypertension and DM.  Looks like she is on both Coreg and midodrine.   Patient presents to the ED via EMS from her dialysis center for evaluation of positional hypotension.  They provide me with orthostatic vital signs, and they started a 500 cc fluid bolus prehospital.  Patient reports feeling fine while supine, and that she had presyncopal dizziness upon standing after she finished her dialysis session today with a drew off 2.5 L of fluid.  She reports not eating any food this morning prior to dialysis, and being a "emotional eater" with some psychosocial stressors at home this week and perhaps not eating quite as much.  Reports compliance with her medications.  Denies recent falls or injuries.  Denies syncopal episodes.  Reports having a chronic wet cough without worsening, fever or increased production.  She reports a mild headache, consistent with her known migraines, since dialysis today and is requesting medications.  Physical Exam   Triage Vital Signs: ED Triage Vitals  Enc Vitals Group     BP      Pulse      Resp      Temp      Temp src      SpO2      Weight      Height      Head Circumference      Peak Flow      Pain Score      Pain Loc      Pain Edu?      Excl. in [place]?     Most recent vital signs: Vitals:   06/01/21 1218 06/01/21 1330  BP: 131/78 (!) 153/74  Pulse: (!) 103 93  Resp: 18 15  Temp: 98.8 F (37.1 C)   SpO2: 97% 99%    General: Awake, no distress.  Pleasant and conversational in full sentences. CV:  Good peripheral perfusion.  Resp:  Normal effort.  Abd:  No distention.   MSK:  No deformity noted.  Tunneled catheter to the right chest Neuro:  No focal deficits appreciated. Other:  AV fistula to left upper extremity with palpable thrill.   ED Results / Procedures / Treatments   Labs (all labs ordered are listed, but only abnormal results are displayed) Labs Reviewed  CBC WITH DIFFERENTIAL/PLATELET - Abnormal; Notable for the following components:      Result Value   RBC 3.06 (*)    Hemoglobin 9.4 (*)    HCT 30.0 (*)    All other components within normal limits  BASIC METABOLIC PANEL - Abnormal; Notable for the following components:   Glucose, Bld 100 (*)    Creatinine, Ser 2.74 (*)    Calcium 8.2 (*)    GFR, Estimated 19 (*)    All other components within normal limits    EKG Sinus rhythm, rate of 102 bpm.  Normal axis and normal intervals.  No evidence of acute ischemia.   RADIOLOGY CXR reviewed by me without evidence of acute cardiopulmonary pathology.  Chronic tiny bilateral pleural effusions  Official radiology report(s): DG Chest 2 View  Result Date: 06/01/2021  CLINICAL DATA:  Cough and dialysis. EXAM: CHEST - 2 VIEW COMPARISON:  03/07/2021 FINDINGS: The lungs are clear without focal pneumonia, edema, pneumothorax or pleural effusion. Chronic atelectasis or scarring noted at the lung bases. Tiny bilateral pleural effusions evident. Cardiopericardial silhouette is at upper limits of normal for size. Right IJ dialysis catheter remains in place. Bones are diffusely demineralized. IMPRESSION: Stable. Basilar atelectasis/scarring and tiny bilateral pleural effusions. Electronically Signed   By: Misty Stanley M.D.   On: 06/01/2021 12:52    PROCEDURES and INTERVENTIONS:  .1-3 Lead EKG Interpretation Performed by: Vladimir Crofts, MD Authorized by: Vladimir Crofts, MD     Interpretation: normal     ECG rate:  90   ECG rate assessment: normal     Rhythm: sinus rhythm     Ectopy: none     Conduction: normal    Medications  sodium chloride 0.9 %  bolus 500 mL (500 mLs Intravenous New Bag/Given 06/01/21 1307)  butalbital-acetaminophen-caffeine (FIORICET) 50-325-40 MG per tablet 2 tablet (2 tablets Oral Given 06/01/21 1307)     IMPRESSION / MDM / ASSESSMENT AND PLAN / ED COURSE  I reviewed the triage vital signs and the nursing notes.  66 year old female presents to the ED for evaluation of positional hypotension in the setting of recent dialysis, likely over drawling from dialysis and relative dehydration, ultimately suitable to outpatient management after rehydration.  She is tachycardic on arrival, and maintains good blood pressures throughout.  Resolved after 500 cc fluid bolus and p.o. intake.  Blood work is reassuring with chronic stigmata of ESRD and CBC is at baseline.  EKG is reassuring without concerning interval changes.  She is subsequently ambulatory at her baseline and has resolution of her symptoms.  I considered admission for this high risk patient, but she is requesting discharge and with her clinical improvement I think this is reasonable.  We will discharge with return precautions.  Clinical Course as of 06/01/21 1448  Thu Jun 01, 2021  1413 Reassessed.  Feeling better.  Headache has nearly resolved and she is finished her 500 cc fluid bolus.  Requesting food.  We discussed ambulation trial and possibly outpatient management, she is in agreement. [DS]  3846 Reassessed.  Has eaten a full meal tray and crackers and peanut butter.  Reports feeling much better now.  She gets up and ambulates at her baseline with me and nurse present in the room, but did not require assistance.  Denies dizziness with getting up or walking.  We discussed management at home, eating before dialysis and return precautions for the ED. [DS]    Clinical Course User Index [DS] Vladimir Crofts, MD     FINAL CLINICAL IMPRESSION(S) / ED DIAGNOSES   Final diagnoses:  None     Rx / DC Orders   ED Discharge Orders     None        Note:  This  document was prepared using Dragon voice recognition software and may include unintentional dictation errors.   Vladimir Crofts, MD 06/01/21 1450

## 2021-06-01 NOTE — ED Notes (Signed)
Pt eating lunch tray at this time. Will continue to monitor for changes.

## 2021-06-08 ENCOUNTER — Telehealth (INDEPENDENT_AMBULATORY_CARE_PROVIDER_SITE_OTHER): Payer: Self-pay

## 2021-06-08 NOTE — Telephone Encounter (Signed)
I attempted to contact the patient to schedule a permcath removal and a message was left for a return call. 

## 2021-06-16 ENCOUNTER — Other Ambulatory Visit: Payer: Self-pay

## 2021-06-21 ENCOUNTER — Telehealth (INDEPENDENT_AMBULATORY_CARE_PROVIDER_SITE_OTHER): Payer: Self-pay

## 2021-06-21 NOTE — Telephone Encounter (Signed)
A fax was received from [place] for the patient to have a permcath removal  and the appt faxed back to [facility] due to the patient having transportation issues and them setting this up. Patient is scheduled with Dr. Lucky Cowboy on 06/26/21 with a 9:00 am arrival time to the MM. Pre-procedure instructions will be faxed back to the dialysis center at [facility].

## 2021-06-25 ENCOUNTER — Other Ambulatory Visit (INDEPENDENT_AMBULATORY_CARE_PROVIDER_SITE_OTHER): Payer: Self-pay | Admitting: Nurse Practitioner

## 2021-06-26 ENCOUNTER — Encounter: Admission: RE | Payer: Self-pay | Source: Ambulatory Visit

## 2021-06-26 ENCOUNTER — Ambulatory Visit: Admission: RE | Admit: 2021-06-26 | Payer: Medicaid Other | Source: Ambulatory Visit | Admitting: Vascular Surgery

## 2021-06-26 DIAGNOSIS — N186 End stage renal disease: Secondary | ICD-10-CM

## 2021-06-26 SURGERY — DIALYSIS/PERMA CATHETER REMOVAL
Anesthesia: LOCAL

## 2021-06-28 ENCOUNTER — Emergency Department: Payer: Medicaid Other

## 2021-06-28 ENCOUNTER — Other Ambulatory Visit: Payer: Self-pay

## 2021-06-28 ENCOUNTER — Emergency Department
Admission: EM | Admit: 2021-06-28 | Discharge: 2021-06-28 | Disposition: A | Discharge status: Home / Self Care | Payer: Medicaid Other | Attending: Emergency Medicine | Admitting: Emergency Medicine

## 2021-06-28 DIAGNOSIS — I120 Hypertensive chronic kidney disease with stage 5 chronic kidney disease or end stage renal disease: Secondary | ICD-10-CM | POA: Diagnosis not present

## 2021-06-28 DIAGNOSIS — X58XXXA Exposure to other specified factors, initial encounter: Secondary | ICD-10-CM | POA: Diagnosis not present

## 2021-06-28 DIAGNOSIS — E1122 Type 2 diabetes mellitus with diabetic chronic kidney disease: Secondary | ICD-10-CM | POA: Diagnosis not present

## 2021-06-28 DIAGNOSIS — D649 Anemia, unspecified: Secondary | ICD-10-CM | POA: Diagnosis not present

## 2021-06-28 DIAGNOSIS — Z992 Dependence on renal dialysis: Secondary | ICD-10-CM | POA: Insufficient documentation

## 2021-06-28 DIAGNOSIS — S39012A Strain of muscle, fascia and tendon of lower back, initial encounter: Secondary | ICD-10-CM | POA: Insufficient documentation

## 2021-06-28 DIAGNOSIS — N186 End stage renal disease: Secondary | ICD-10-CM | POA: Insufficient documentation

## 2021-06-28 DIAGNOSIS — S3992XA Unspecified injury of lower back, initial encounter: Secondary | ICD-10-CM | POA: Diagnosis present

## 2021-06-28 DIAGNOSIS — E1142 Type 2 diabetes mellitus with diabetic polyneuropathy: Secondary | ICD-10-CM | POA: Insufficient documentation

## 2021-06-28 DIAGNOSIS — R7989 Other specified abnormal findings of blood chemistry: Secondary | ICD-10-CM | POA: Diagnosis not present

## 2021-06-28 LAB — COMPREHENSIVE METABOLIC PANEL
ALT: 20 U/L (ref 0–44)
AST: 20 U/L (ref 15–41)
Albumin: 3.3 g/dL — ABNORMAL LOW (ref 3.5–5.0)
Alkaline Phosphatase: 108 U/L (ref 38–126)
Anion gap: 12 (ref 5–15)
BUN: 43 mg/dL — ABNORMAL HIGH (ref 8–23)
CO2: 26 mmol/L (ref 22–32)
Calcium: 8.3 mg/dL — ABNORMAL LOW (ref 8.9–10.3)
Chloride: 100 mmol/L (ref 98–111)
Creatinine, Ser: 4.58 mg/dL — ABNORMAL HIGH (ref 0.44–1.00)
GFR, Estimated: 10 mL/min — ABNORMAL LOW (ref >60)
Glucose, Bld: 101 mg/dL — ABNORMAL HIGH (ref 70–99)
Potassium: 4.4 mmol/L (ref 3.5–5.1)
Sodium: 138 mmol/L (ref 135–145)
Total Bilirubin: 0.6 mg/dL (ref 0.3–1.2)
Total Protein: 7.2 g/dL (ref 6.5–8.1)

## 2021-06-28 LAB — CBC WITH DIFFERENTIAL/PLATELET
Abs Immature Granulocytes: 0.03 10*3/uL (ref 0.00–0.07)
Basophils Absolute: 0.1 10*3/uL (ref 0.0–0.1)
Basophils Relative: 1 %
Eosinophils Absolute: 0.1 10*3/uL (ref 0.0–0.5)
Eosinophils Relative: 2 %
HCT: 32.4 % — ABNORMAL LOW (ref 36.0–46.0)
Hemoglobin: 10 g/dL — ABNORMAL LOW (ref 12.0–15.0)
Immature Granulocytes: 0 %
Lymphocytes Relative: 22 %
Lymphs Abs: 1.8 10*3/uL (ref 0.7–4.0)
MCH: 30.5 pg (ref 26.0–34.0)
MCHC: 30.9 g/dL (ref 30.0–36.0)
MCV: 98.8 fL (ref 80.0–100.0)
Monocytes Absolute: 1 10*3/uL (ref 0.1–1.0)
Monocytes Relative: 13 %
Neutro Abs: 5.2 10*3/uL (ref 1.7–7.7)
Neutrophils Relative %: 62 %
Platelets: 303 10*3/uL (ref 150–400)
RBC: 3.28 MIL/uL — ABNORMAL LOW (ref 3.87–5.11)
RDW: 14.5 % (ref 11.5–15.5)
WBC: 8.2 10*3/uL (ref 4.0–10.5)
nRBC: 0 % (ref 0.0–0.2)

## 2021-06-28 LAB — TROPONIN I (HIGH SENSITIVITY)
Troponin I (High Sensitivity): 21 ng/L — ABNORMAL HIGH (ref <18)
Troponin I (High Sensitivity): 19 ng/L — ABNORMAL HIGH (ref <18)

## 2021-06-28 MED ORDER — CYCLOBENZAPRINE HCL 10 MG PO TABS
10.0000 mg | ORAL_TABLET | Freq: Once | ORAL | Status: AC
  Administered 2021-06-28: 10 mg via ORAL
  Filled 2021-06-28: qty 1

## 2021-06-28 MED ORDER — KETOROLAC TROMETHAMINE 30 MG/ML IJ SOLN
30.0000 mg | Freq: Once | INTRAMUSCULAR | Status: DC
  Filled 2021-06-28: qty 1

## 2021-06-28 MED ORDER — ONDANSETRON HCL 4 MG/2ML IJ SOLN
4.0000 mg | Freq: Once | INTRAMUSCULAR | Status: AC
  Administered 2021-06-28: 4 mg via INTRAVENOUS
  Filled 2021-06-28: qty 2

## 2021-06-28 MED ORDER — ONDANSETRON 8 MG PO TBDP: 8.0000 mg | ORAL_TABLET | Freq: Three times a day (TID) | ORAL | 0 refills | Status: DC | PRN

## 2021-06-28 MED ORDER — CYCLOBENZAPRINE HCL 10 MG PO TABS: 10.0000 mg | ORAL_TABLET | Freq: Three times a day (TID) | ORAL | 0 refills | Status: AC | PRN

## 2021-06-28 MED ORDER — KETOROLAC TROMETHAMINE 15 MG/ML IJ SOLN
15.0000 mg | Freq: Once | INTRAMUSCULAR | Status: AC
  Administered 2021-06-28: 15 mg via INTRAVENOUS

## 2021-06-28 NOTE — ED Notes (Signed)
Friend called for ride and friend did not answer at this time, no message left.

## 2021-06-28 NOTE — ED Notes (Signed)
Friend, Lea called back and stated she would come pick pt up but needed a half hour.

## 2021-06-28 NOTE — ED Triage Notes (Signed)
Pt presents to ED via EMS with lower back spasms. Pt states HX of disks issues. Pt states this has been ongoing and increasing for 4-5 days. Pt denies injury or trauma at this time. Pt denies falls. Pt is a dialysis pt and states being taken off 1 hour early yesterday due to hypotension. Pt does reports some numbness and tingling but states this does happen occasionally. Pt was ambulatory from EMS stretcher to ER bed. Pt is A&Ox4 at this time. NAD noted.   Pt does report some some chest pain and SOB. Pt speaking in full sentences without issue.

## 2021-06-28 NOTE — ED Notes (Signed)
Warm blanket and light dimmed, call bell in reach.

## 2021-06-28 NOTE — ED Provider Notes (Signed)
[facility] Provider Note    Event Date/Time   First MD Initiated Contact with Patient 06/28/21 [phone]     (approximate)   History   Back Pain  HPI Aletheia Tangredi is a 66 y.o. 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  Patient states that the symptoms began overnight last night and have been uncontrolled by Tylenol at home.  Patient denies any weakness/numbness, bowel/bladder incontinence, or fever/chills     Physical Exam   Triage Vital Signs: ED Triage Vitals [06/28/21 0910]  Enc Vitals Group     BP (!) 164/91     Pulse Rate 87     Resp 17     Temp 98.1 F (36.7 C)     Temp Source Oral     SpO2 99 %     Weight      Height      Head Circumference      Peak Flow      Pain Score 9     Pain Loc      Pain Edu?      Excl. in [place]?     Most recent vital signs: Vitals:   06/28/21 1000 06/28/21 1139  BP: (!) 163/78 (!) 178/87  Pulse: 86 85  Resp: 17 17  Temp:    SpO2: 99% 100%    General: Awake, oriented x4. CV:  Good peripheral perfusion.  Resp:  Normal effort.  Abd:  No distention.  Other:  Middle-aged Caucasian overweight female sitting in bed in mild distress secondary to pain.  Upper right lumbar paraspinal musculature tenderness to palpation with associated muscular spasm.  No midline pain, sensation and motor intact in bilateral lower and upper extremities   ED Results / Procedures / Treatments   Labs (all labs ordered are listed, but only abnormal results are displayed) Labs Reviewed  COMPREHENSIVE METABOLIC PANEL - Abnormal; Notable for the following components:      Result Value   Glucose, Bld 101 (*)    BUN 43 (*)    Creatinine, Ser 4.58 (*)    Calcium 8.3 (*)    Albumin 3.3 (*)    GFR,  Estimated 10 (*)    All other components within normal limits  CBC WITH DIFFERENTIAL/PLATELET - Abnormal; Notable for the following components:   RBC 3.28 (*)    Hemoglobin 10.0 (*)    HCT 32.4 (*)    All other components within normal limits  TROPONIN I (HIGH SENSITIVITY) - Abnormal; Notable for the following components:   Troponin I (High Sensitivity) 21 (*)    All other components within normal limits  TROPONIN I (HIGH SENSITIVITY) - Abnormal; Notable for the following components:   Troponin I (High Sensitivity) 19 (*)    All other components within normal limits   RADIOLOGY ED MD interpretation: CT of the lumbar spine without contrast shows osteopenia with no acute osseous abnormality as well as capacious lumbar spinal canal and mild for age lumbar spinal degeneration which is stable from last year.  Incidentally found chronic right pleural effusion and large bowel diverticulosis -Agree with radiology assessment Official radiology report(s): CT Lumbar Spine Wo Contrast  Result Date: 06/28/2021 CLINICAL DATA:  66 year old female dialysis patient with low back pain, back spasm. Symptoms increasing for  4-5 days. No known injury. EXAM: CT LUMBAR SPINE WITHOUT CONTRAST TECHNIQUE: Multidetector CT imaging of the lumbar spine was performed without intravenous contrast administration. Multiplanar CT image reconstructions were also generated. RADIATION DOSE REDUCTION: This exam was performed according to the departmental dose-optimization program which includes automated exposure control, adjustment of the mA and/or kV according to patient size and/or use of iterative reconstruction technique. COMPARISON:  CT Abdomen and Pelvis 12/12/2020. FINDINGS: Segmentation: Normal. Alignment: Stable lumbar lordosis. No significant spondylolisthesis. Vertebrae: Chronic osteopenia, although may have progressed since last year. Visible lower thoracic levels are intact. Chronic benign L2 vertebral body hemangioma is  stable. Lumbar levels appear stable and intact. Intact visible sacrum and SI joints. No acute osseous abnormality identified. Paraspinal and other soft tissues: Widespread severe calcified atherosclerosis. Vascular patency is not evaluated in the absence of IV contrast. Normal caliber abdominal aorta. No lymphadenopathy in the visible abdomen. Chronic right pleural effusion partially visible at the right costophrenic angle. Diverticulosis of the large bowel. Negative visible other noncontrast abdominal viscera. Lumbar paraspinal soft tissues are within normal limits. Disc levels: Fairly capacious spinal canal, and generally mild for age lumbar spine degeneration which appears stable from the CT last year. No CT evidence of lumbar spinal stenosis. And mild if any associated neural foraminal stenosis. IMPRESSION: 1. Osteopenia.  No acute osseous abnormality in the lumbar spine. 2. 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 3. Aortic Atherosclerosis (ICD10-I70.0). Chronic right pleural effusion. Large bowel diverticulosis. Electronically Signed   By: Genevie Ann M.D.   On: 06/28/2021 10:52      PROCEDURES:  Critical Care performed: No  Procedures   MEDICATIONS ORDERED IN ED: Medications  cyclobenzaprine (FLEXERIL) tablet 10 mg (10 mg Oral Given 06/28/21 0943)  ketorolac (TORADOL) 15 MG/ML injection 15 mg (15 mg Intravenous Given 06/28/21 0945)  ondansetron (ZOFRAN) injection 4 mg (4 mg Intravenous Given 06/28/21 1138)     IMPRESSION / MDM / ASSESSMENT AND PLAN / ED COURSE  I reviewed the triage vital signs and the nursing notes.                             The patient is on the cardiac monitor to evaluate for evidence of arrhythmia and/or significant heart rate changes. Patient presents for low back pain. Given History and Exam the patient appears to be at low risk for Spinal Cord Compression Syndrome, Vertebral Malignancy/Mets, acute  Spinal Fracture, Vertebral Osteomyelitis, Epidural Abscess, Infected or Obstructing Kidney Stone.  Their presentation appears most likely to be secondary to non-emergent musculoskeletal etiology vs non-emergent disc herniation.  ED Workup: CT lumbar, CBC, CMP, EKG, troponin CT of the lumbar spine does not show any evidence of acute abnormalities CBC shows stable chronic anemia with hemoglobin of 10 Troponin mildly elevated at 19 which is baseline for her given ESRD on dialysis CMP showing elevated creatinine and BUN just to be expected in the setting of of ESRD on dialysis.  Potassium 4.4  Pain better controlled with Toradol and Flexeril  Disposition: Discharge. Strict return precautions discussed with patient with full understanding. Advised patient to follow up promptly with primary care provider      FINAL CLINICAL IMPRESSION(S) / ED DIAGNOSES   Final diagnoses:  Strain of lumbar region, initial encounter     Rx / DC Orders   ED Discharge Orders  Ordered    ondansetron (ZOFRAN-ODT) 8 MG disintegrating tablet  Every 8 hours PRN        06/28/21 1114    cyclobenzaprine (FLEXERIL) 10 MG tablet  3 times daily PRN        06/28/21 1212             Note:  This document was prepared using Dragon voice recognition software and may include unintentional dictation errors.   Naaman Plummer, MD 06/28/21 1215

## 2021-06-28 NOTE — ED Notes (Signed)
D/C and reasons to return to ED discussed with pt, pt verbalized understanding. NAD Noted.

## 2021-07-04 ENCOUNTER — Encounter: Payer: Self-pay | Admitting: Emergency Medicine

## 2021-07-04 ENCOUNTER — Emergency Department
Admission: EM | Admit: 2021-07-04 | Discharge: 2021-07-04 | Disposition: A | Discharge status: Home / Self Care | Payer: Medicaid Other | Attending: Emergency Medicine | Admitting: Emergency Medicine

## 2021-07-04 ENCOUNTER — Other Ambulatory Visit: Payer: Self-pay

## 2021-07-04 DIAGNOSIS — I120 Hypertensive chronic kidney disease with stage 5 chronic kidney disease or end stage renal disease: Secondary | ICD-10-CM | POA: Insufficient documentation

## 2021-07-04 DIAGNOSIS — R0989 Other specified symptoms and signs involving the circulatory and respiratory systems: Secondary | ICD-10-CM

## 2021-07-04 DIAGNOSIS — I959 Hypotension, unspecified: Secondary | ICD-10-CM | POA: Diagnosis not present

## 2021-07-04 DIAGNOSIS — N186 End stage renal disease: Secondary | ICD-10-CM | POA: Insufficient documentation

## 2021-07-04 DIAGNOSIS — E1122 Type 2 diabetes mellitus with diabetic chronic kidney disease: Secondary | ICD-10-CM | POA: Insufficient documentation

## 2021-07-04 DIAGNOSIS — Z992 Dependence on renal dialysis: Secondary | ICD-10-CM | POA: Insufficient documentation

## 2021-07-04 LAB — CBC
HCT: 33.6 % — ABNORMAL LOW (ref 36.0–46.0)
Hemoglobin: 10.5 g/dL — ABNORMAL LOW (ref 12.0–15.0)
MCH: 29.6 pg (ref 26.0–34.0)
MCHC: 31.3 g/dL (ref 30.0–36.0)
MCV: 94.6 fL (ref 80.0–100.0)
Platelets: 233 10*3/uL (ref 150–400)
RBC: 3.55 MIL/uL — ABNORMAL LOW (ref 3.87–5.11)
RDW: 13.9 % (ref 11.5–15.5)
WBC: 6.1 10*3/uL (ref 4.0–10.5)
nRBC: 0 % (ref 0.0–0.2)

## 2021-07-04 LAB — BASIC METABOLIC PANEL
Anion gap: 13 (ref 5–15)
BUN: 19 mg/dL (ref 8–23)
CO2: 31 mmol/L (ref 22–32)
Calcium: 8.3 mg/dL — ABNORMAL LOW (ref 8.9–10.3)
Chloride: 96 mmol/L — ABNORMAL LOW (ref 98–111)
Creatinine, Ser: 2.77 mg/dL — ABNORMAL HIGH (ref 0.44–1.00)
GFR, Estimated: 19 mL/min — ABNORMAL LOW (ref >60)
Glucose, Bld: 125 mg/dL — ABNORMAL HIGH (ref 70–99)
Potassium: 3.2 mmol/L — ABNORMAL LOW (ref 3.5–5.1)
Sodium: 140 mmol/L (ref 135–145)

## 2021-07-04 MED ORDER — OXYCODONE HCL 5 MG PO TABS
5.0000 mg | ORAL_TABLET | Freq: Once | ORAL | Status: AC
Start: 2021-07-04 — End: 2021-07-04
  Administered 2021-07-04: 5 mg via ORAL
  Filled 2021-07-04: qty 1

## 2021-07-04 NOTE — ED Triage Notes (Signed)
Pt comes with c/o hypotension.. pt states she was at dialysis and finished treatment. Pt denies CP or SOB.  Current BP-118/56

## 2021-07-04 NOTE — ED Provider Triage Note (Signed)
Emergency Medicine Provider Triage Evaluation Note  Katie [name] , a 66 y.o. female  was evaluated in triage.  Pt complains of blood pressure.  Patient finished her dialysis treatment and they noticed that her blood pressure was low.  Sent her to the ED.  No pain except for regular pain from dialysis..  Review of Systems  Positive: Low blood pressure Negative: Fever  Physical Exam  BP (!) 118/56    Pulse (!) 111    Temp 98.7 F (37.1 C)    Resp 19    SpO2 93%  Gen:   Awake, no distress   Resp:  Normal effort  MSK:   Moves extremities without difficulty  Other:    Medical Decision Making  Medically screening exam initiated at 12:07 PM.  Appropriate orders placed.  Lorain Fettes was informed that the remainder of the evaluation will be completed by another provider, this initial triage assessment does not replace that evaluation, and the importance of remaining in the ED until their evaluation is complete.     [name], PA-C 07/04/21 1208

## 2021-07-04 NOTE — ED Provider Notes (Signed)
[facility] Provider Note    Event Date/Time   First MD Initiated Contact with Patient 07/04/21 1300     (approximate)   History   Hypotension   HPI  Katie [name] is a 66 y.o. female who presents to the ED for evaluation of Hypotension   I reviewed outpatient vascular surgery visit from 12/21.  History of ESRD on hemodialysis, hypertension and diabetes.  Prescribed Coreg and midodrine.  Patient presents to the ED from her dialysis facility for evaluation of hypotension.  After dialysis where they drew off 1.3 L, she had a blood pressure of 70/40 and so they sent her to the ED.  Here in the ED, she has had multiple normotensive and hypertensive blood pressure readings without any hypotension documented.  She is observed for about 2 and half hours and is only complaining of chronic pain.  She has no acute symptoms and reports frustration that this keeps happening, she reports that she has been here before for similar presentation and had a benign work-up.  Denies changes to her medication regimen.   Physical Exam   Triage Vital Signs: ED Triage Vitals [07/04/21 1204]  Enc Vitals Group     BP (!) 118/56     Pulse Rate (!) 111     Resp 19     Temp 98.7 F (37.1 C)     Temp src      SpO2 93 %     Weight      Height      Head Circumference      Peak Flow      Pain Score 5     Pain Loc      Pain Edu?      Excl. in [place]?     Most recent vital signs: Vitals:   07/04/21 1204 07/04/21 1343  BP: (!) 118/56 (!) 147/100  Pulse: (!) 111 87  Resp: 19 20  Temp: 98.7 F (37.1 C)   SpO2: 93% 100%    General: Awake, no distress.  CV:  Good peripheral perfusion.  RRR Resp:  Normal effort.  CTA B Abd:  No distention.  Soft and nontender MSK:  No deformity noted.  Tunneled catheter to the right chest with clean insertion site. Neuro:  No focal deficits appreciated. Cranial nerves II through XII intact 5/5 strength and sensation in all 4  extremities Other:     ED Results / Procedures / Treatments   Labs (all labs ordered are listed, but only abnormal results are displayed) Labs Reviewed  CBC - Abnormal; Notable for the following components:      Result Value   RBC 3.55 (*)    Hemoglobin 10.5 (*)    HCT 33.6 (*)    All other components within normal limits  BASIC METABOLIC PANEL - Abnormal; Notable for the following components:   Potassium 3.2 (*)    Chloride 96 (*)    Glucose, Bld 125 (*)    Creatinine, Ser 2.77 (*)    Calcium 8.3 (*)    GFR, Estimated 19 (*)    All other components within normal limits    EKG   RADIOLOGY   Official radiology report(s): No results found.  PROCEDURES and INTERVENTIONS:  Procedures  Medications  oxyCODONE (Oxy IR/ROXICODONE) immediate release tablet 5 mg (has no administration in time range)     IMPRESSION / MDM / ASSESSMENT AND PLAN / ED COURSE  I reviewed the triage vital signs and the nursing  notes.  66 year old woman with history of labile blood pressure and dialysis dependent presents to the ED with postdialysis hypotension, without evidence of such here in the ED and ultimately suitable for outpatient management.  She remains hypertensive and normotensive throughout her ED stay.  She looks clinically well to me and has no evidence of endorgan damage of hypotension, chest pain, headache, trauma, neurologic or vascular deficits.  Blood work is benign with chronic normocytic anemia and stigmata of ESRD.  We will provide medications for her chronic pain as we arrange transport.  We discussed return precautions for the ED      FINAL CLINICAL IMPRESSION(S) / ED DIAGNOSES   Final diagnoses:  Labile blood pressure  ESRD (end stage renal disease) on dialysis [facility])     Rx / DC Orders   ED Discharge Orders     None        Note:  This document was prepared using Dragon voice recognition software and may include unintentional dictation errors.   Vladimir Crofts, MD 07/04/21 1356

## 2021-07-04 NOTE — ED Notes (Signed)
See traige note. Pt had dialysis today with 1.3 L removed and was hypotensive after, 70s/30s. EMS BP was 120/70. BP just taken and is now 147/100. Dialysis center sent pt here via EMS for hypotension. Pt is complaining of spasmodic pain in abdomen and back and would like pain medication.

## 2021-07-04 NOTE — ED Notes (Signed)
First Nurse Note:  Pt to ED via [facility] from [facility], they took off 1.3 liters of fluid.  Pt became hypotensive after treatment, which is normal for her, but staff said that it stayed low for too long this time so they called ems. EMS reports that when they took pts BP it was 120/70. Pt is in NAD.

## 2021-07-08 ENCOUNTER — Emergency Department
Admission: EM | Admit: 2021-07-08 | Discharge: 2021-07-08 | Disposition: A | Discharge status: Home / Self Care | Payer: Medicaid Other | Attending: Emergency Medicine | Admitting: Emergency Medicine

## 2021-07-08 ENCOUNTER — Other Ambulatory Visit: Payer: Self-pay

## 2021-07-08 DIAGNOSIS — E876 Hypokalemia: Secondary | ICD-10-CM | POA: Insufficient documentation

## 2021-07-08 DIAGNOSIS — E860 Dehydration: Secondary | ICD-10-CM

## 2021-07-08 DIAGNOSIS — Z992 Dependence on renal dialysis: Secondary | ICD-10-CM | POA: Diagnosis not present

## 2021-07-08 DIAGNOSIS — I9589 Other hypotension: Secondary | ICD-10-CM

## 2021-07-08 DIAGNOSIS — I951 Orthostatic hypotension: Secondary | ICD-10-CM | POA: Insufficient documentation

## 2021-07-08 DIAGNOSIS — R42 Dizziness and giddiness: Secondary | ICD-10-CM | POA: Diagnosis present

## 2021-07-08 DIAGNOSIS — E861 Hypovolemia: Secondary | ICD-10-CM | POA: Insufficient documentation

## 2021-07-08 DIAGNOSIS — N186 End stage renal disease: Secondary | ICD-10-CM | POA: Diagnosis not present

## 2021-07-08 DIAGNOSIS — R739 Hyperglycemia, unspecified: Secondary | ICD-10-CM | POA: Insufficient documentation

## 2021-07-08 DIAGNOSIS — J449 Chronic obstructive pulmonary disease, unspecified: Secondary | ICD-10-CM | POA: Insufficient documentation

## 2021-07-08 LAB — CBC
HCT: 34 % — ABNORMAL LOW (ref 36.0–46.0)
Hemoglobin: 10.6 g/dL — ABNORMAL LOW (ref 12.0–15.0)
MCH: 29.6 pg (ref 26.0–34.0)
MCHC: 31.2 g/dL (ref 30.0–36.0)
MCV: 95 fL (ref 80.0–100.0)
Platelets: 253 10*3/uL (ref 150–400)
RBC: 3.58 MIL/uL — ABNORMAL LOW (ref 3.87–5.11)
RDW: 13.5 % (ref 11.5–15.5)
WBC: 6.5 10*3/uL (ref 4.0–10.5)
nRBC: 0 % (ref 0.0–0.2)

## 2021-07-08 LAB — BASIC METABOLIC PANEL
Anion gap: 12 (ref 5–15)
BUN: 18 mg/dL (ref 8–23)
CO2: 28 mmol/L (ref 22–32)
Calcium: 8.5 mg/dL — ABNORMAL LOW (ref 8.9–10.3)
Chloride: 97 mmol/L — ABNORMAL LOW (ref 98–111)
Creatinine, Ser: 2.53 mg/dL — ABNORMAL HIGH (ref 0.44–1.00)
GFR, Estimated: 21 mL/min — ABNORMAL LOW (ref >60)
Glucose, Bld: 126 mg/dL — ABNORMAL HIGH (ref 70–99)
Potassium: 3.2 mmol/L — ABNORMAL LOW (ref 3.5–5.1)
Sodium: 137 mmol/L (ref 135–145)

## 2021-07-08 MED ORDER — SODIUM CHLORIDE 0.9 % IV BOLUS
500.0000 mL | Freq: Once | INTRAVENOUS | Status: AC
  Administered 2021-07-08: 500 mL via INTRAVENOUS

## 2021-07-08 MED ORDER — BUTALBITAL-APAP-CAFFEINE 50-325-40 MG PO TABS
1.0000 | ORAL_TABLET | Freq: Once | ORAL | Status: AC
  Administered 2021-07-08: 1 via ORAL
  Filled 2021-07-08: qty 1

## 2021-07-08 MED ORDER — SODIUM CHLORIDE 0.9 % IV BOLUS
250.0000 mL | Freq: Once | INTRAVENOUS | Status: AC
  Administered 2021-07-08: 250 mL via INTRAVENOUS

## 2021-07-08 NOTE — ED Notes (Signed)
D/C and reasons to return to ED discussed with pt, pt verbalized understanding. This RN contacted cab company for pt and pt made aware of p/up time. Pt will be paying for her own cab, pt verbalized understanding. NAD noted. VSS.

## 2021-07-08 NOTE — ED Triage Notes (Signed)
Pt comes into the ED via EMS from [facility] with hypotension, completed treatment, they got 99/49. Only complaint is dizziness with standing.  #[id number] 97%RA HR80 98.4 temp

## 2021-07-08 NOTE — ED Notes (Signed)
Pt presents to ED with c/o of being sent to ER after her dialysis [place]. Pt states they tend to always send her after with c/o of hypotension. Pt only c/o of dizziness when standing. Pt is A&Ox4. Pt states she got a full treatment with no issues. Pt able to stand at bedside with steady gait, pt normally uses a cane to get around.   Orthostatics obtained from sitting to standing.

## 2021-07-08 NOTE — ED Provider Notes (Signed)
[facility] Provider Note   Event Date/Time   First MD Initiated Contact with Patient 07/08/21 1500     (approximate) History  Hypotension and Dizziness  HPI Katie [name] is a 66 y.o. female with a stated past medical history of chronic back pain, migraine, COPD, and end-stage renal disease on dialysis Tuesday/Thursday/Saturday who presents after dialysis complaining of of orthostatic lightheadedness.  Patient denies any loss of consciousness.  Patient states that towards the end of her session when she attempted to stand up she felt like she was going to pass out which is a sensation that she has had many times before the end of her dialysis sessions.  Patient states that her systolic blood pressure got down into the 70s prior to arrival.  Patient received 250 cc of of normal saline prior to my evaluation and states that she feels much better but not quite back to normal.  Patient denies any associated chest pain, worsening of chronic shortness of breath, weakness/numbness/paresthesias in any extremity, abdominal pain, or nausea/vomiting. Physical Exam  Triage Vital Signs: ED Triage Vitals  Enc Vitals Group     BP 07/08/21 1149 (!) 87/51     Pulse Rate 07/08/21 1149 81     Resp 07/08/21 1149 18     Temp 07/08/21 1149 98.5 F (36.9 C)     Temp Source 07/08/21 1149 Oral     SpO2 07/08/21 1149 98 %     Weight --      Height --      Head Circumference --      Peak Flow --      Pain Score 07/08/21 1148 5     Pain Loc --      Pain Edu? --      Excl. in [place]? --    Most recent vital signs: Vitals:   07/08/21 1502 07/08/21 1503  BP: 124/74 101/74  Pulse: 76 80  Resp: 20 20  Temp:    SpO2: 98% 98%   General: Awake, oriented x4. CV:  Good peripheral perfusion.  Resp:  Normal effort.  Mild end expiratory wheezes over bilateral lung fields Abd:  No distention.  Other:  Overweight middle-aged Caucasian female laying in bed in no distress ED Results / Procedures  / Treatments  Labs (all labs ordered are listed, but only abnormal results are displayed) Labs Reviewed  BASIC METABOLIC PANEL - Abnormal; Notable for the following components:      Result Value   Potassium 3.2 (*)    Chloride 97 (*)    Glucose, Bld 126 (*)    Creatinine, Ser 2.53 (*)    Calcium 8.5 (*)    GFR, Estimated 21 (*)    All other components within normal limits  CBC - Abnormal; Notable for the following components:   RBC 3.58 (*)    Hemoglobin 10.6 (*)    HCT 34.0 (*)    All other components within normal limits  URINALYSIS, ROUTINE W REFLEX MICROSCOPIC  CBG MONITORING, ED   EKG ED ECG REPORT I, Naaman Plummer, the attending physician, personally viewed and interpreted this ECG. Date: 07/08/2021 EKG Time: 1202 Rate: 81 Rhythm: normal sinus rhythm QRS Axis: normal Intervals: normal ST/T Wave abnormalities: normal Narrative Interpretation: no evidence of acute ischemia PROCEDURES: Critical Care performed: No Procedures MEDICATIONS ORDERED IN ED: Medications  sodium chloride 0.9 % bolus 250 mL (250 mLs Intravenous New Bag/Given 07/08/21 1203)  sodium chloride 0.9 % bolus 500 mL (500  mLs Intravenous Bolus 07/08/21 1532)  butalbital-acetaminophen-caffeine (FIORICET) 50-325-40 MG per tablet 1 tablet (1 tablet Oral Given 07/08/21 1539)   IMPRESSION / MDM / ASSESSMENT AND PLAN / ED COURSE  I reviewed the triage vital signs and the nursing notes.                             Differential diagnosis includes, but is not limited to, ACS, sepsis, anemia, aortic dissection, PE, dehydration The patient is on the cardiac monitor to evaluate for evidence of arrhythmia and/or significant heart rate changes.  Patient is a 66 year old female with a significant past medical history of COPD and end-stage renal disease on dialysis Tuesday/Thursday/Saturday who presents after dialysis complaining of orthostatic lightheadedness.  Patient's laboratory evaluation reviewed by me and  significant for chronic anemia with hemoglobin of 10.6, chronic renal failure with creatinine of 2.53 which is at her baseline.  Mild hypokalemia 3.2 which is to be expected after dialysis.  Mild hyperglycemia of 126 without any anion gap elevation.  Given patient's response to initial 250 cc bolus of fluid, patient likely suffering from dehydration secondary to over-dialysis.  Patient states that she has had symptoms similar to this in the past that have been caused by over dialysis in the past.  Given these results and EKG showing no evidence of ischemia, I have lower concern for the above listed life-threatening differential diagnoses.  Patient responded appropriately to another 500 cc bolus of IV fluids with improvement of her orthostatic vital signs.  I discussed with patient the possible need for more fluids and she stated that she understands that she is dehydrated and will attempt slow rehydration at home as needed based on her symptoms.  I encouraged her to follow-up with her nephrologist, Dr. Holley Raring as soon as possible after discharge.  Dispo: Discharge home with PCP and nephrology follow-up    FINAL CLINICAL IMPRESSION(S) / ED DIAGNOSES   Final diagnoses:  Dehydration  Hypotension due to hypovolemia   Rx / DC Orders   ED Discharge Orders     None      Note:  This document was prepared using Dragon voice recognition software and may include unintentional dictation errors.   Naaman Plummer, MD 07/08/21 [phone]

## 2021-07-08 NOTE — ED Notes (Signed)
Pt given apple sauce, saltine crackers and ice water, pt states "I haven't ate since last night".

## 2021-07-17 ENCOUNTER — Telehealth (INDEPENDENT_AMBULATORY_CARE_PROVIDER_SITE_OTHER): Payer: Self-pay

## 2021-07-17 NOTE — Telephone Encounter (Signed)
Patient has been scheduled for a permcath removal on 07/24/21 with Dr. Lucky Cowboy for 07/24/21 with a 11:00 am arrival time to the [facility]. Pre-procedure instructions were faxed to [place]. ?

## 2021-07-24 ENCOUNTER — Other Ambulatory Visit (INDEPENDENT_AMBULATORY_CARE_PROVIDER_SITE_OTHER): Payer: Self-pay | Admitting: Nurse Practitioner

## 2021-07-24 ENCOUNTER — Ambulatory Visit: Admission: RE | Admit: 2021-07-24 | Payer: Medicaid Other | Source: Ambulatory Visit | Admitting: Vascular Surgery

## 2021-07-24 DIAGNOSIS — N186 End stage renal disease: Secondary | ICD-10-CM

## 2021-07-24 SURGERY — DIALYSIS/PERMA CATHETER REMOVAL
Anesthesia: LOCAL

## 2021-09-23 ENCOUNTER — Encounter: Payer: Self-pay | Admitting: Intensive Care

## 2021-09-23 ENCOUNTER — Emergency Department: Payer: Medicaid Other

## 2021-09-23 ENCOUNTER — Inpatient Hospital Stay: Payer: Medicaid Other

## 2021-09-23 ENCOUNTER — Inpatient Hospital Stay
Admission: EM | Admit: 2021-09-23 | Discharge: 2021-09-27 | DRG: 602 | Disposition: A | Discharge status: Home / Self Care | Payer: Medicaid Other | Attending: Internal Medicine | Admitting: Internal Medicine

## 2021-09-23 ENCOUNTER — Other Ambulatory Visit: Payer: Self-pay

## 2021-09-23 DIAGNOSIS — L089 Local infection of the skin and subcutaneous tissue, unspecified: Secondary | ICD-10-CM | POA: Diagnosis not present

## 2021-09-23 DIAGNOSIS — Z79899 Other long term (current) drug therapy: Secondary | ICD-10-CM | POA: Diagnosis not present

## 2021-09-23 DIAGNOSIS — I5032 Chronic diastolic (congestive) heart failure: Secondary | ICD-10-CM | POA: Diagnosis present

## 2021-09-23 DIAGNOSIS — S61259A Open bite of unspecified finger without damage to nail, initial encounter: Secondary | ICD-10-CM | POA: Diagnosis not present

## 2021-09-23 DIAGNOSIS — J440 Chronic obstructive pulmonary disease with acute lower respiratory infection: Secondary | ICD-10-CM | POA: Diagnosis present

## 2021-09-23 DIAGNOSIS — Z880 Allergy status to penicillin: Secondary | ICD-10-CM | POA: Diagnosis not present

## 2021-09-23 DIAGNOSIS — I10 Essential (primary) hypertension: Secondary | ICD-10-CM | POA: Diagnosis present

## 2021-09-23 DIAGNOSIS — Z992 Dependence on renal dialysis: Secondary | ICD-10-CM | POA: Diagnosis not present

## 2021-09-23 DIAGNOSIS — Z23 Encounter for immunization: Secondary | ICD-10-CM | POA: Diagnosis not present

## 2021-09-23 DIAGNOSIS — N2581 Secondary hyperparathyroidism of renal origin: Secondary | ICD-10-CM | POA: Diagnosis present

## 2021-09-23 DIAGNOSIS — E119 Type 2 diabetes mellitus without complications: Secondary | ICD-10-CM

## 2021-09-23 DIAGNOSIS — W540XXA Bitten by dog, initial encounter: Secondary | ICD-10-CM

## 2021-09-23 DIAGNOSIS — F32A Depression, unspecified: Secondary | ICD-10-CM | POA: Diagnosis present

## 2021-09-23 DIAGNOSIS — R109 Unspecified abdominal pain: Secondary | ICD-10-CM

## 2021-09-23 DIAGNOSIS — Z7982 Long term (current) use of aspirin: Secondary | ICD-10-CM

## 2021-09-23 DIAGNOSIS — D631 Anemia in chronic kidney disease: Secondary | ICD-10-CM | POA: Diagnosis present

## 2021-09-23 DIAGNOSIS — J209 Acute bronchitis, unspecified: Secondary | ICD-10-CM | POA: Diagnosis present

## 2021-09-23 DIAGNOSIS — N390 Urinary tract infection, site not specified: Secondary | ICD-10-CM | POA: Diagnosis present

## 2021-09-23 DIAGNOSIS — K828 Other specified diseases of gallbladder: Secondary | ICD-10-CM | POA: Diagnosis present

## 2021-09-23 DIAGNOSIS — Z87891 Personal history of nicotine dependence: Secondary | ICD-10-CM | POA: Diagnosis not present

## 2021-09-23 DIAGNOSIS — R051 Acute cough: Secondary | ICD-10-CM

## 2021-09-23 DIAGNOSIS — E1122 Type 2 diabetes mellitus with diabetic chronic kidney disease: Secondary | ICD-10-CM | POA: Diagnosis present

## 2021-09-23 DIAGNOSIS — Z8249 Family history of ischemic heart disease and other diseases of the circulatory system: Secondary | ICD-10-CM | POA: Diagnosis not present

## 2021-09-23 DIAGNOSIS — L03114 Cellulitis of left upper limb: Secondary | ICD-10-CM | POA: Diagnosis present

## 2021-09-23 DIAGNOSIS — N186 End stage renal disease: Secondary | ICD-10-CM

## 2021-09-23 DIAGNOSIS — E875 Hyperkalemia: Secondary | ICD-10-CM | POA: Diagnosis present

## 2021-09-23 DIAGNOSIS — I132 Hypertensive heart and chronic kidney disease with heart failure and with stage 5 chronic kidney disease, or end stage renal disease: Secondary | ICD-10-CM | POA: Diagnosis present

## 2021-09-23 DIAGNOSIS — J90 Pleural effusion, not elsewhere classified: Secondary | ICD-10-CM | POA: Diagnosis present

## 2021-09-23 DIAGNOSIS — S61452A Open bite of left hand, initial encounter: Secondary | ICD-10-CM | POA: Diagnosis present

## 2021-09-23 DIAGNOSIS — L03113 Cellulitis of right upper limb: Secondary | ICD-10-CM | POA: Diagnosis present

## 2021-09-23 DIAGNOSIS — R10A1 Flank pain, right side: Secondary | ICD-10-CM

## 2021-09-23 LAB — URINALYSIS, ROUTINE W REFLEX MICROSCOPIC
Bilirubin Urine: NEGATIVE
Glucose, UA: 150 mg/dL — AB
Hgb urine dipstick: NEGATIVE
Ketones, ur: 5 mg/dL — AB
Nitrite: NEGATIVE
Protein, ur: 100 mg/dL — AB
Specific Gravity, Urine: 1.012 (ref 1.005–1.030)
WBC, UA: >50 WBC/hpf — ABNORMAL HIGH (ref 0–5)
pH: 9 — ABNORMAL HIGH (ref 5.0–8.0)

## 2021-09-23 LAB — COMPREHENSIVE METABOLIC PANEL
ALT: 25 U/L (ref 0–44)
AST: 22 U/L (ref 15–41)
Albumin: 3.5 g/dL (ref 3.5–5.0)
Alkaline Phosphatase: 111 U/L (ref 38–126)
Anion gap: 16 — ABNORMAL HIGH (ref 5–15)
BUN: 64 mg/dL — ABNORMAL HIGH (ref 8–23)
CO2: 23 mmol/L (ref 22–32)
Calcium: 9 mg/dL (ref 8.9–10.3)
Chloride: 100 mmol/L (ref 98–111)
Creatinine, Ser: 5.94 mg/dL — ABNORMAL HIGH (ref 0.44–1.00)
GFR, Estimated: 7 mL/min — ABNORMAL LOW (ref >60)
Glucose, Bld: 90 mg/dL (ref 70–99)
Potassium: 5.3 mmol/L — ABNORMAL HIGH (ref 3.5–5.1)
Sodium: 139 mmol/L (ref 135–145)
Total Bilirubin: 0.6 mg/dL (ref 0.3–1.2)
Total Protein: 7 g/dL (ref 6.5–8.1)

## 2021-09-23 LAB — CBC
HCT: 40.8 % (ref 36.0–46.0)
Hemoglobin: 12.7 g/dL (ref 12.0–15.0)
MCH: 30.2 pg (ref 26.0–34.0)
MCHC: 31.1 g/dL (ref 30.0–36.0)
MCV: 96.9 fL (ref 80.0–100.0)
Platelets: 279 10*3/uL (ref 150–400)
RBC: 4.21 MIL/uL (ref 3.87–5.11)
RDW: 16 % — ABNORMAL HIGH (ref 11.5–15.5)
WBC: 7.4 10*3/uL (ref 4.0–10.5)
nRBC: 0 % (ref 0.0–0.2)

## 2021-09-23 LAB — LIPASE, BLOOD: Lipase: 39 U/L (ref 11–51)

## 2021-09-23 LAB — PROCALCITONIN: Procalcitonin: 0.15 ng/mL

## 2021-09-23 MED ORDER — ACETAMINOPHEN 650 MG RE SUPP: 650.0000 mg | Freq: Four times a day (QID) | RECTAL | Status: DC | PRN

## 2021-09-23 MED ORDER — HYDROMORPHONE HCL 1 MG/ML IJ SOLN
0.5000 mg | Freq: Once | INTRAMUSCULAR | Status: AC
  Administered 2021-09-23: 0.5 mg via INTRAVENOUS
  Filled 2021-09-23: qty 0.5

## 2021-09-23 MED ORDER — VANCOMYCIN HCL IN DEXTROSE 1-5 GM/200ML-% IV SOLN: 1000.0000 mg | INTRAVENOUS | Status: DC

## 2021-09-23 MED ORDER — SODIUM CHLORIDE 0.9 % IV SOLN
1.0000 g | Freq: Once | INTRAVENOUS | Status: AC
  Administered 2021-09-23: 1 g via INTRAVENOUS
  Filled 2021-09-23: qty 10

## 2021-09-23 MED ORDER — METRONIDAZOLE 500 MG PO TABS
500.0000 mg | ORAL_TABLET | Freq: Three times a day (TID) | ORAL | Status: AC
  Administered 2021-09-24 – 2021-09-26 (×8): 500 mg via ORAL
  Filled 2021-09-23 (×10): qty 1

## 2021-09-23 MED ORDER — HEPARIN SODIUM (PORCINE) 5000 UNIT/ML IJ SOLN
5000.0000 [IU] | Freq: Three times a day (TID) | INTRAMUSCULAR | Status: DC
  Administered 2021-09-24 – 2021-09-27 (×10): 5000 [IU] via SUBCUTANEOUS
  Filled 2021-09-23 (×10): qty 1

## 2021-09-23 MED ORDER — INSULIN ASPART 100 UNIT/ML IJ SOLN: 0.0000 [IU] | Freq: Every day | INTRAMUSCULAR | Status: DC

## 2021-09-23 MED ORDER — ONDANSETRON HCL 4 MG PO TABS
4.0000 mg | ORAL_TABLET | Freq: Four times a day (QID) | ORAL | Status: DC | PRN
Start: 2021-09-23 — End: 2021-09-27

## 2021-09-23 MED ORDER — SODIUM CHLORIDE 0.9 % IV SOLN
1.0000 g | Freq: Every day | INTRAVENOUS | Status: DC
  Administered 2021-09-24 – 2021-09-25 (×2): 1 g via INTRAVENOUS
  Filled 2021-09-23 (×2): qty 10

## 2021-09-23 MED ORDER — CYCLOBENZAPRINE HCL 10 MG PO TABS
5.0000 mg | ORAL_TABLET | Freq: Once | ORAL | Status: AC
  Administered 2021-09-23: 5 mg via ORAL
  Filled 2021-09-23: qty 1

## 2021-09-23 MED ORDER — TOPIRAMATE 25 MG PO TABS
50.0000 mg | ORAL_TABLET | Freq: Two times a day (BID) | ORAL | Status: DC | PRN
  Administered 2021-09-24: 50 mg via ORAL
  Filled 2021-09-23 (×2): qty 2

## 2021-09-23 MED ORDER — CALCIUM ACETATE (PHOS BINDER) 667 MG PO CAPS
667.0000 mg | ORAL_CAPSULE | Freq: Three times a day (TID) | ORAL | Status: DC
  Administered 2021-09-24 – 2021-09-27 (×10): 667 mg via ORAL
  Filled 2021-09-23 (×11): qty 1

## 2021-09-23 MED ORDER — INSULIN ASPART 100 UNIT/ML IJ SOLN
0.0000 [IU] | Freq: Three times a day (TID) | INTRAMUSCULAR | Status: DC
  Filled 2021-09-23: qty 1

## 2021-09-23 MED ORDER — VANCOMYCIN HCL 1750 MG/350ML IV SOLN
1750.0000 mg | Freq: Once | INTRAVENOUS | Status: AC
  Administered 2021-09-24: 1750 mg via INTRAVENOUS
  Filled 2021-09-23: qty 350

## 2021-09-23 MED ORDER — TIZANIDINE HCL 4 MG PO TABS
4.0000 mg | ORAL_TABLET | Freq: Two times a day (BID) | ORAL | Status: DC | PRN
  Filled 2021-09-23: qty 1

## 2021-09-23 MED ORDER — ONDANSETRON 4 MG PO TBDP
4.0000 mg | ORAL_TABLET | Freq: Once | ORAL | Status: AC
  Administered 2021-09-23: 4 mg via ORAL
  Filled 2021-09-23: qty 1

## 2021-09-23 MED ORDER — VENLAFAXINE HCL 37.5 MG PO TABS
37.5000 mg | ORAL_TABLET | Freq: Two times a day (BID) | ORAL | Status: DC
  Administered 2021-09-24 – 2021-09-27 (×7): 37.5 mg via ORAL
  Filled 2021-09-23 (×9): qty 1

## 2021-09-23 MED ORDER — TRAZODONE HCL 50 MG PO TABS
50.0000 mg | ORAL_TABLET | Freq: Every evening | ORAL | Status: DC
  Administered 2021-09-24 – 2021-09-26 (×3): 50 mg via ORAL
  Filled 2021-09-23 (×3): qty 1

## 2021-09-23 MED ORDER — ONDANSETRON HCL 4 MG/2ML IJ SOLN
4.0000 mg | Freq: Four times a day (QID) | INTRAMUSCULAR | Status: DC | PRN
  Administered 2021-09-26: 4 mg via INTRAVENOUS

## 2021-09-23 MED ORDER — CARVEDILOL 12.5 MG PO TABS
12.5000 mg | ORAL_TABLET | Freq: Every evening | ORAL | Status: DC
  Administered 2021-09-24 – 2021-09-26 (×4): 12.5 mg via ORAL
  Filled 2021-09-23: qty 2
  Filled 2021-09-23 (×3): qty 1

## 2021-09-23 MED ORDER — AMOXICILLIN-POT CLAVULANATE 875-125 MG PO TABS
1.0000 | ORAL_TABLET | Freq: Once | ORAL | Status: AC
  Administered 2021-09-23: 1 via ORAL
  Filled 2021-09-23: qty 1

## 2021-09-23 MED ORDER — ACETAMINOPHEN 325 MG PO TABS
650.0000 mg | ORAL_TABLET | Freq: Four times a day (QID) | ORAL | Status: DC | PRN
  Administered 2021-09-25: 650 mg via ORAL
  Filled 2021-09-23: qty 2

## 2021-09-23 MED ORDER — ONDANSETRON HCL 4 MG/2ML IJ SOLN
4.0000 mg | Freq: Once | INTRAMUSCULAR | Status: AC
Start: 2021-09-23 — End: 2021-09-23
  Administered 2021-09-23: 4 mg via INTRAVENOUS
  Filled 2021-09-23: qty 2

## 2021-09-23 MED ORDER — PATIROMER SORBITEX CALCIUM 8.4 G PO PACK
16.8000 g | PACK | Freq: Once | ORAL | Status: AC
  Administered 2021-09-24: 16.8 g via ORAL
  Filled 2021-09-23: qty 2

## 2021-09-23 NOTE — Progress Notes (Signed)
PHARMACY -  BRIEF ANTIBIOTIC NOTE  ? ?Pharmacy has received consult(s) for vancomycin and cefepime from an ED provider.  The patient's profile has been reviewed for ht/wt/allergies/indication/available labs.   ? ?One time order(s) placed for: ?Cefepime 1 g IV  ?Vancomycin 1750 mg IV ? ?Further antibiotics/pharmacy consults should be ordered by admitting physician if indicated.       ?                ?Thank you, ?Shameria Trimarco O Verdella Laidlaw ?09/23/2021  9:08 PM ? ?

## 2021-09-23 NOTE — Consult Note (Signed)
Pharmacy Antibiotic Note ? ?Katie [name] is a 66 y.o. female admitted on 09/23/2021 with cellulitis.  Pharmacy has been consulted for cefepime and vancomycin dosing. ESRD on HD TTS.  ? ?Plan: ?Will start cefepime 1 g q24H.  ? ?Pt ordered vancomycin 1750 mg x 1 in the ED. Will order vancomycin 1000 mg on HD days. Goal trough 15-25. Plan to order level after 3rd dose.  ? ?Height: 5\' 6"  (167.6 cm) ?Weight: 81.6 kg (180 lb) ?IBW/kg (Calculated) : 59.3 ? ?Temp (24hrs), Avg:98.3 ?F (36.8 ?C), Min:98.3 ?F (36.8 ?C), Max:98.3 ?F (36.8 ?C) ? ?Recent Labs  ?Lab 09/23/21 ?7944  ?WBC 7.4  ?CREATININE 5.94*  ?  ?Estimated Creatinine Clearance: 10.4 mL/min (A) (by C-G formula based on SCr of 5.94 mg/dL (H)).   ? ?Allergies  ?Allergen Reactions  ? Penicillins Nausea And Vomiting  ?  Pt states "only pills" make her vomit  ? ? ?Antimicrobials this admission: ?5/13 cefepime >>  ?5/13 vancomycin >>  ? ?Dose adjustments this admission: ?None ? ?Microbiology results: ?5/13 BCx: pending ? ? ?Thank you for allowing pharmacy to be a part of this patient?s care. ? ?Oswald Hillock, PharmD, BCPS ?09/23/2021 10:44 PM ? ?

## 2021-09-23 NOTE — ED Notes (Addendum)
Pt c/o arm swelling that started after being bit by a dog earlier this week. Pt has bite marks on her left index and middle finger. Left hand is swollen. Pt also c/o flank pain.  ?

## 2021-09-23 NOTE — Assessment & Plan Note (Signed)
Sliding scale insulin coverage 

## 2021-09-23 NOTE — Assessment & Plan Note (Signed)
Continue home venlafaxine and trazodone pending med rec ?

## 2021-09-23 NOTE — ED Provider Notes (Signed)
[facility] Emergency Department Provider Note     Event Date/Time   First MD Initiated Contact with Patient 09/23/21 1828     (approximate)   History   Arm Swelling (left)   HPI  Kortnie Stovall is a 66 y.o. female with a history of CKD on TThS dialysis, DM, hypertension, hyperkalemia who presents to the ED with right flank pain for the last week.  Patient also has an unrelated complaint when she was bitten by a dog in her home on Wednesday as she was trying to separate her 2 dogs who were fighting.  She notes that the dog or up-to-date on a routine rabies vaccines.  She did not make it to dialysis today, citing difficulty with her transportation.  She also reports an intermittent cough but denies any frank chest pain or shortness of breath.  Patient still makes urine regularly, but denies any dysuria, hematuria, or history of kidney stones.  Physical Exam   Triage Vital Signs: ED Triage Vitals  Enc Vitals Group     BP 09/23/21 1747 (!) 148/78     Pulse Rate 09/23/21 1747 82     Resp 09/23/21 1747 18     Temp 09/23/21 1747 98.3 F (36.8 C)     Temp Source 09/23/21 1747 Oral     SpO2 09/23/21 1747 95 %     Weight 09/23/21 1741 180 lb (81.6 kg)     Height 09/23/21 1741 5\' 6"  (1.676 m)     Head Circumference --      Peak Flow --      Pain Score 09/23/21 1740 6     Pain Loc --      Pain Edu? --      Excl. in [place]? --     Most recent vital signs: Vitals:   09/23/21 1747  BP: (!) 148/78  Pulse: 82  Resp: 18  Temp: 98.3 F (36.8 C)  SpO2: 95%    General Awake, no distress.  CV:  Good peripheral perfusion.  RESP:  Normal effort.  ABD:  No distention. Soft, nontender MSK:  Left hand with a potential dog bite over the middle phalanx.  No surrounding erythema appreciated.  Multiple other small abrasions consistent with dog bites to the left hand.   ED Results / Procedures / Treatments   Labs (all labs ordered are listed, but only abnormal  results are displayed) Labs Reviewed  COMPREHENSIVE METABOLIC PANEL - Abnormal; Notable for the following components:      Result Value   Potassium 5.3 (*)    BUN 64 (*)    Creatinine, Ser 5.94 (*)    GFR, Estimated 7 (*)    Anion gap 16 (*)    All other components within normal limits  CBC - Abnormal; Notable for the following components:   RDW 16.0 (*)    All other components within normal limits  LIPASE, BLOOD  URINALYSIS, ROUTINE W REFLEX MICROSCOPIC     EKG   RADIOLOGY   No results found.   PROCEDURES:  Critical Care performed: No  Procedures   MEDICATIONS ORDERED IN ED: Medications  amoxicillin-clavulanate (AUGMENTIN) 875-125 MG per tablet 1 tablet (has no administration in time range)  ondansetron (ZOFRAN-ODT) disintegrating tablet 4 mg (has no administration in time range)     IMPRESSION / MDM / ASSESSMENT AND PLAN / ED COURSE  I reviewed the triage vital signs and the nursing notes.  Differential diagnosis includes, but is not limited to, diverticulitis, urinary tract infection/pyelonephritis, endometriosis, bowel obstruction, colitis, renal colic, gastroenteritis, hernia, fibroids, UTI, cellulitis, CAP, etc.   The patient is on the cardiac monitor to evaluate for evidence of arrhythmia and/or significant heart rate changes.  Patient to the ED for evaluation of right flank pain and persistent cough as well as recent dog bite to the hand.  Patient will be evaluated for complaints in the ED with pending labs and x-ray.  Care at this time is transferred to my attending, Duffy Bruce, for final disposition.   FINAL CLINICAL IMPRESSION(S) / ED DIAGNOSES   Final diagnoses:  Dog bite, initial encounter  Acute right flank pain     Rx / DC Orders   ED Discharge Orders     None        Note:  This document was prepared using Dragon voice recognition software and may include unintentional dictation errors.     Melvenia Needles, PA-C 09/27/21 1124    Duffy Bruce, MD 10/02/21 [phone]

## 2021-09-23 NOTE — H&P (Signed)
?History and Physical  ? ? ?Patient: Katie [name] EPP:[id number] DOB: 1956-03-11 ?DOA: 09/23/2021 ?DOS: the patient was seen and examined on 09/23/2021 ?PCP: Pcp, No  ?Patient coming from: Home ? ?Chief Complaint:  ?Chief Complaint  ?Patient presents with  ? Arm Swelling  ?  left  ? ? ?HPI: Katie [name] is a 66 y.o. female with medical history significant for ESRD on TTS, DM, HTN who presents to the ED with a complaint of swelling and redness of the left arm following a dog bite 2 days prior while she was trying to separate her 2 dogs who were fighting.  She received bite wounds to the left third finger, right forearm and scratches on her lower extremities.  All are healing well except for the one on the left third finger.  She has been cleaning the area with alcohol but has not seen an improvement.  She states that the dogs are up-to-date on the rabies vaccines.  In addition to the pain from the dog bite, she also complains of right flank pain for the past week and missing her dialysis session on 5/13.  She denies shortness of breath or chest pain.  Denies nausea or vomiting.  Denies fever or chills. ?ED course and data review: Vitals within normal limits.  CBC and CMP mostly unremarkable except for mildly elevated potassium of 5.3 and renal function in keeping with dialysis status.  X-ray hand shows no acute bony abnormality.  Other imaging studies: Chest x-ray shows small bilateral pleural effusions.  CT renal stone study shows trace bilateral pleural effusions with left lower lobe atelectasis or infiltrate and layering density in the gallbladder which on follow-up right upper quadrant ultrasound shows no acute process and possible gallbladder sludge.  Patient started on cefepime and vancomycin.  Hospitalist consulted for admission.  ? ?Review of Systems: As mentioned in the history of present illness. All other systems reviewed and are negative. ?Past Medical History:  ?Diagnosis Date  ? 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 2  diabetes mellitus ([place])   ? Hypertension   ? Renal disorder   ? Type 2 diabetes mellitus ([place])   ? ?Past Surgical History:  ?Procedure Laterality Date  ? APPENDECTOMY    ? AV FISTULA PLACEMENT Left 03/16/2021  ? Procedure: INSERTION OF ARTERIOVENOUS (AV) GORE-TEX GRAFT ARM;  Surgeon: Algernon Huxley, MD;  Location: [facility] ORS;  Service: Vascular;  Laterality: Left;  ? DIALYSIS/PERMA CATHETER INSERTION N/A 05/16/2020  ? Procedure: DIALYSIS/PERMA CATHETER INSERTION;  Surgeon: Algernon Huxley, MD;  Location: [place] [facility];  Service: Cardiovascular;  Laterality: N/A;  ? MANDIBLE SURGERY    ? RENAL BIOPSY Right   ? RIGHT HEART CATH N/A 05/10/2020  ? Procedure: RIGHT HEART CATH;  Surgeon: Nelva Bush, MD;  Location: [place] [facility];  Service: Cardiovascular;  Laterality: N/A;  ? TEMPORARY DIALYSIS CATHETER N/A 05/11/2020  ? Procedure: TEMPORARY DIALYSIS CATHETER;  Surgeon: Algernon Huxley, MD;  Location: [place] [facility];  Service: Cardiovascular;  Laterality: N/A;  ? ?Social History:  reports that she has quit smoking. Her smoking use included cigarettes. She has never used smokeless tobacco. She reports that she does not currently use alcohol. She reports that she does not use drugs. ? ?Allergies  ?Allergen Reactions  ? Penicillins Nausea And Vomiting  ?  Pt states "only pills" make her vomit  ? ? ?Family History  ?Problem Relation Age of Onset  ? Heart attack Mother   ? Asthma Mother   ?  Uterine cancer Mother   ? Skin cancer Father   ? Prostate cancer Father   ? CAD Father   ? Parkinson's disease Father   ? Lung cancer Brother   ? ? ?Prior to Admission medications   ?Medication Sig Start Date End Date Taking? Authorizing Provider  ?acetaminophen (TYLENOL) 500 MG tablet Take 500 mg by mouth every 6 (six) hours as needed for moderate pain.    [provider]  ?aspirin EC 81 MG EC tablet Take 1 tablet (81 mg total) by mouth daily. Swallow whole. 03/21/21   Sidney Ace, MD  ?calcium acetate (PHOSLO)  667 MG capsule Take 1 capsule (667 mg total) by mouth 3 (three) times daily with meals. 03/21/21   Sidney Ace, MD  ?carvedilol (COREG) 12.5 MG tablet Take 1 tablet (12.5 mg total) by mouth every evening. 03/21/21   Sidney Ace, MD  ?lactulose (CHRONULAC) 10 GM/15ML solution Take 22.5 mLs (15 g total) by mouth daily. 03/21/21   Sidney Ace, MD  ?lidocaine (LIDODERM) 5 % Place 1 patch onto the skin daily. Remove & Discard patch within 12 hours or as directed by MD 03/21/21   Sidney Ace, MD  ?lidocaine-prilocaine (EMLA) cream Apply topically. 06/17/21   [provider]  ?midodrine (PROAMATINE) 5 MG tablet Take 1 tablet (5 mg total) by mouth 3 (three) times daily with meals. 03/21/21   Sidney Ace, MD  ?ondansetron (ZOFRAN-ODT) 8 MG disintegrating tablet Take 1 tablet (8 mg total) by mouth every 8 (eight) hours as needed for nausea or vomiting. 06/28/21   Naaman Plummer, MD  ?senna-docusate (SENOKOT-S) 8.6-50 MG tablet Take 1 tablet by mouth 2 (two) times daily. 03/21/21   Sidney Ace, MD  ?tiZANidine (ZANAFLEX) 4 MG tablet Take 4 mg by mouth 2 (two) times daily as needed. 06/19/21   [provider]  ?topiramate (TOPAMAX) 50 MG tablet Take 1 tablet (50 mg total) by mouth 2 (two) times daily as needed (headache). 03/21/21   Sidney Ace, MD  ?traZODone (DESYREL) 50 MG tablet Take 1 tablet (50 mg total) by mouth every evening. 07/02/20 08/01/20  Nolberto Hanlon, MD  ?venlafaxine (EFFEXOR) 37.5 MG tablet Take 1 tablet (37.5 mg total) by mouth 2 (two) times daily with a meal. 07/02/20 08/01/20  Nolberto Hanlon, MD  ? ? ?Physical Exam: ?Vitals:  ? 09/23/21 1741 09/23/21 1747 09/23/21 2000  ?BP:  (!) 148/78 (!) 162/89  ?Pulse:  82 89  ?Resp:  18 18  ?Temp:  98.3 ?F (36.8 ?C)   ?TempSrc:  Oral   ?SpO2:  95% 100%  ?Weight: 81.6 kg    ?Height: 5\' 6"  (1.676 m)    ? ?Physical Exam ?Vitals and nursing note reviewed.  ?Constitutional:   ?   General: She is not in acute  distress. ?HENT:  ?   Head: Normocephalic and atraumatic.  ?Cardiovascular:  ?   Rate and Rhythm: Normal rate and regular rhythm.  ?   Pulses: Normal pulses.  ?   Heart sounds: Normal heart sounds.  ?Pulmonary:  ?   Effort: Pulmonary effort is normal.  ?   Breath sounds: Normal breath sounds.  ?Abdominal:  ?   Palpations: Abdomen is soft.  ?   Tenderness: There is no abdominal tenderness.  ?Musculoskeletal:  ?   Comments: See pic below  ?Skin: ?   Comments: Healing wound proximal and ventral right forearm.  Bruises on forearms, laceration dorsal aspect left third distal interphalangeal  joint  ?Neurological:  ?   Mental Status: Mental status is at baseline.  ? ? ? ? ?Data Reviewed: ?Relevant notes from primary care and specialist visits, past discharge summaries as available in EHR, including Care Everywhere. ?Prior diagnostic testing as pertinent to current admission diagnoses ?Updated medications and problem lists for reconciliation ?ED course, including vitals, labs, imaging, treatment and response to treatment ?Triage notes, nursing and pharmacy notes and ED provider's notes ?Notable results as noted in HPI ? ? ?Assessment and Plan: ?* Infected dog bite of hand including fingers, left, initial encounter ?Patient allergic to penicillin so will continue cefepime and Vanco from the ED and will add Flagyl for additional anaerobic coverage ?Pain management ?Dog has been vaccinated against rabies, per patient ?Update tetanus  ?Wound caare ? ?Right flank pain ?CT abdomen and right upper quadrant ultrasound with no acute finding except for gallbladder sludge ?Follow urinalysis to evaluate for UTI ? ?Hyperkalemia ?Mild elevation of potassium to 5.3.  Will give a one-time dose of Veltassa.  Should improve with dialysis ? ?Depression, unspecified ?Continue home venlafaxine and trazodone pending med rec ? ?ESRD on dialysis [facility]) ?Nephrology consulted for continuation of dialysis ? ?Type 2 diabetes mellitus ([place]) ?Sliding  scale insulin coverage ? ?Hypertension ?Stable.  Continue carvedilol ? ? ? ? ? ? ?Advance Care Planning:   Code Status: Prior  ? ?Consults: Nephrologist, Dr.Bhutani ? ?Family Communication: none ? ?Severity of Ill

## 2021-09-23 NOTE — ED Notes (Signed)
Patient transported to X-ray 

## 2021-09-23 NOTE — Assessment & Plan Note (Signed)
Nephrology consulted for continuation of dialysis ?

## 2021-09-23 NOTE — ED Triage Notes (Signed)
Patient c/o right flank pain X1 week. Also bit by dog on Wednesday and now experiencing left hand/arm swelling. Patient reports stage IV kidney failure.  ?

## 2021-09-23 NOTE — ED Triage Notes (Signed)
Pt in via EMS from home with c/o right kidney pain. Pt with stage IV kidney failure and anxiety. Pt had a dog bite on Friday on left hand and appears infected. 189/69, 88HR, 97% RA ?

## 2021-09-23 NOTE — Assessment & Plan Note (Signed)
Stable.  Continue carvedilol. 

## 2021-09-23 NOTE — Assessment & Plan Note (Addendum)
Patient allergic to penicillin so will continue cefepime and Vanco from the ED and will add Flagyl for additional anaerobic coverage ?Pain management ?Dog has been vaccinated against rabies, per patient ?Update tetanus  ?Wound caare ?

## 2021-09-23 NOTE — Assessment & Plan Note (Signed)
Mild elevation of potassium to 5.3.  Will give a one-time dose of Veltassa.  Should improve with dialysis ?

## 2021-09-23 NOTE — Assessment & Plan Note (Signed)
CT abdomen and right upper quadrant ultrasound with no acute finding except for gallbladder sludge ?Follow urinalysis to evaluate for UTI ?

## 2021-09-24 DIAGNOSIS — S61452A Open bite of left hand, initial encounter: Secondary | ICD-10-CM | POA: Diagnosis not present

## 2021-09-24 DIAGNOSIS — W540XXA Bitten by dog, initial encounter: Secondary | ICD-10-CM | POA: Diagnosis not present

## 2021-09-24 DIAGNOSIS — S61259A Open bite of unspecified finger without damage to nail, initial encounter: Secondary | ICD-10-CM | POA: Diagnosis not present

## 2021-09-24 DIAGNOSIS — L089 Local infection of the skin and subcutaneous tissue, unspecified: Secondary | ICD-10-CM | POA: Diagnosis not present

## 2021-09-24 LAB — MRSA NEXT GEN BY PCR, NASAL: MRSA by PCR Next Gen: NOT DETECTED

## 2021-09-24 LAB — PHOSPHORUS: Phosphorus: 7.4 mg/dL — ABNORMAL HIGH (ref 2.5–4.6)

## 2021-09-24 LAB — BASIC METABOLIC PANEL
Anion gap: 12 (ref 5–15)
BUN: 69 mg/dL — ABNORMAL HIGH (ref 8–23)
CO2: 26 mmol/L (ref 22–32)
Calcium: 8.7 mg/dL — ABNORMAL LOW (ref 8.9–10.3)
Chloride: 98 mmol/L (ref 98–111)
Creatinine, Ser: 6.38 mg/dL — ABNORMAL HIGH (ref 0.44–1.00)
GFR, Estimated: 7 mL/min — ABNORMAL LOW (ref >60)
Glucose, Bld: 122 mg/dL — ABNORMAL HIGH (ref 70–99)
Potassium: 4.6 mmol/L (ref 3.5–5.1)
Sodium: 136 mmol/L (ref 135–145)

## 2021-09-24 LAB — HEMOGLOBIN A1C
Hgb A1c MFr Bld: 4.7 % — ABNORMAL LOW (ref 4.8–5.6)
Mean Plasma Glucose: 88.19 mg/dL

## 2021-09-24 LAB — GLUCOSE, CAPILLARY
Glucose-Capillary: 133 mg/dL — ABNORMAL HIGH (ref 70–99)
Glucose-Capillary: 91 mg/dL (ref 70–99)

## 2021-09-24 LAB — CBG MONITORING, ED
Glucose-Capillary: 81 mg/dL (ref 70–99)
Glucose-Capillary: 88 mg/dL (ref 70–99)

## 2021-09-24 MED ORDER — NICOTINE 21 MG/24HR TD PT24
21.0000 mg | MEDICATED_PATCH | Freq: Every day | TRANSDERMAL | Status: DC
  Filled 2021-09-24 (×3): qty 1

## 2021-09-24 MED ORDER — TETANUS-DIPHTH-ACELL PERTUSSIS 5-2.5-18.5 LF-MCG/0.5 IM SUSY
0.5000 mL | PREFILLED_SYRINGE | Freq: Once | INTRAMUSCULAR | Status: AC
  Administered 2021-09-24: 0.5 mL via INTRAMUSCULAR
  Filled 2021-09-24: qty 0.5

## 2021-09-24 MED ORDER — POLYVINYL ALCOHOL 1.4 % OP SOLN
1.0000 [drp] | OPHTHALMIC | Status: DC | PRN
Start: 2021-09-24 — End: 2021-09-27

## 2021-09-24 MED ORDER — SENNOSIDES-DOCUSATE SODIUM 8.6-50 MG PO TABS
1.0000 | ORAL_TABLET | Freq: Every evening | ORAL | Status: DC | PRN
Start: 2021-09-24 — End: 2021-09-27

## 2021-09-24 MED ORDER — CHLORHEXIDINE GLUCONATE CLOTH 2 % EX PADS
6.0000 | MEDICATED_PAD | Freq: Every day | CUTANEOUS | Status: DC
  Administered 2021-09-25 – 2021-09-27 (×3): 6 via TOPICAL

## 2021-09-24 MED ORDER — IPRATROPIUM-ALBUTEROL 0.5-2.5 (3) MG/3ML IN SOLN
3.0000 mL | RESPIRATORY_TRACT | Status: DC | PRN
Start: 2021-09-24 — End: 2021-09-27

## 2021-09-24 MED ORDER — LORATADINE 10 MG PO TABS: 10.0000 mg | ORAL_TABLET | Freq: Every day | ORAL | Status: DC | PRN

## 2021-09-24 MED ORDER — METOPROLOL TARTRATE 5 MG/5ML IV SOLN: 5.0000 mg | INTRAVENOUS | Status: DC | PRN

## 2021-09-24 MED ORDER — KETOROLAC TROMETHAMINE 15 MG/ML IJ SOLN
15.0000 mg | Freq: Once | INTRAMUSCULAR | Status: AC
  Administered 2021-09-24: 15 mg via INTRAVENOUS
  Filled 2021-09-24: qty 1

## 2021-09-24 MED ORDER — CHLORHEXIDINE GLUCONATE CLOTH 2 % EX PADS: 6.0000 | MEDICATED_PAD | Freq: Every day | CUTANEOUS | Status: DC

## 2021-09-24 MED ORDER — OXYCODONE-ACETAMINOPHEN 5-325 MG PO TABS
1.0000 | ORAL_TABLET | ORAL | Status: DC | PRN
  Administered 2021-09-25 – 2021-09-27 (×8): 2 via ORAL
  Filled 2021-09-24 (×8): qty 2

## 2021-09-24 MED ORDER — HYDROCORTISONE 1 % EX CREA: 1.0000 "application " | TOPICAL_CREAM | Freq: Three times a day (TID) | CUTANEOUS | Status: DC | PRN

## 2021-09-24 MED ORDER — HYDROCORTISONE (PERIANAL) 2.5 % EX CREA
1.0000 | TOPICAL_CREAM | Freq: Four times a day (QID) | CUTANEOUS | Status: DC | PRN
Start: 2021-09-24 — End: 2021-09-27

## 2021-09-24 MED ORDER — HYDRALAZINE HCL 20 MG/ML IJ SOLN: 10.0000 mg | INTRAMUSCULAR | Status: DC | PRN

## 2021-09-24 MED ORDER — BLISTEX MEDICATED EX OINT
1.0000 | TOPICAL_OINTMENT | CUTANEOUS | Status: DC | PRN
Start: 2021-09-24 — End: 2021-09-27

## 2021-09-24 MED ORDER — GUAIFENESIN 100 MG/5ML PO LIQD
5.0000 mL | ORAL | Status: DC | PRN
Start: 2021-09-24 — End: 2021-09-27

## 2021-09-24 MED ORDER — SALINE SPRAY 0.65 % NA SOLN: 1.0000 | NASAL | Status: DC | PRN

## 2021-09-24 MED ORDER — HYDROCODONE-ACETAMINOPHEN 5-325 MG PO TABS
1.0000 | ORAL_TABLET | ORAL | Status: AC | PRN
  Administered 2021-09-24 (×2): 1 via ORAL
  Filled 2021-09-24 (×2): qty 1

## 2021-09-24 MED ORDER — MUSCLE RUB 10-15 % EX CREA
1.0000 | TOPICAL_CREAM | CUTANEOUS | Status: DC | PRN
Start: 2021-09-24 — End: 2021-09-27

## 2021-09-24 MED ORDER — REVEFENACIN 175 MCG/3ML IN SOLN
175.0000 ug | Freq: Every day | RESPIRATORY_TRACT | Status: DC
  Administered 2021-09-24 – 2021-09-27 (×4): 175 ug via RESPIRATORY_TRACT
  Filled 2021-09-24 (×4): qty 3

## 2021-09-24 MED ORDER — PHENOL 1.4 % MT LIQD
1.0000 | OROMUCOSAL | Status: DC | PRN
Start: 2021-09-24 — End: 2021-09-27

## 2021-09-24 MED ORDER — ARFORMOTEROL TARTRATE 15 MCG/2ML IN NEBU
15.0000 ug | INHALATION_SOLUTION | Freq: Two times a day (BID) | RESPIRATORY_TRACT | Status: DC
Start: 2021-09-24 — End: 2021-09-27
  Administered 2021-09-24 – 2021-09-27 (×6): 15 ug via RESPIRATORY_TRACT
  Filled 2021-09-24 (×8): qty 2

## 2021-09-24 MED ORDER — ALUM & MAG HYDROXIDE-SIMETH 200-200-20 MG/5ML PO SUSP
30.0000 mL | ORAL | Status: DC | PRN
Start: 2021-09-24 — End: 2021-09-27

## 2021-09-24 NOTE — Progress Notes (Signed)
Katie [name] ? ?MRN: [id number] ? ?DOB/AGE: Sep 17, 1955 66 y.o. ? ?Primary Care Physician:Pcp, No ? ?Admit date: 09/23/2021 ? ?Chief Complaint:  ?Chief Complaint  ?Patient presents with  ? Arm Swelling  ?  left  ? ? ?S-Pt presented on  09/23/2021 with  ?Chief Complaint  ?Patient presents with  ? Arm Swelling  ?  left  ? ? ?Patient is well-known to our practice from previous admissions ?And from outpatient follow-up as patient is getting dialysis at [facility] dialysis under Dr. Elwyn Lade care ? ?Patient is a 66 year old female with a past medical history of ESRD, patient is a hemodialysis, patient is on Tuesday Thursday Saturday schedule, diabetes mellitus, hypertension who came to the ER with chief complaint of swelling and redness of the left arm . ? ?History of present illness dates back to around 2 days ago when she was trying to separate her 2 dogs who were fighting.  She received bite wounds to the left third finger, right forearm and scratches on her lower extremities.   ?Most of these wounds healed well except for the one on the left third finger.   ?Patient gave a history that the dogs are up-to-date on the rabies vaccines. ? ?Patient at the time of presentation also complained of right flank pain for the past week . ? ?Upon evaluation in the ER Patient had work-up done and Chest x-ray shows small bilateral pleural effusions.   ?CT renal stone study shows trace bilateral pleural effusions with left lower lobe atelectasis or infiltrate and layering density in the gallbladder which on follow-up right upper quadrant ultrasound shows no acute process and possible gallbladder sludge.   ?Patient was admitted for further care  ?Nephrology was consulted for comanagement of dialysis patient ?Patient did not go to her scheduled dialysis session on Saturday on 5/13.   ?Patient was seen today on second floor ?Patient offers no complaint of chest pain ?No cough no shortness of breath ?No complaint of nausea/vomiting ?No  complaint of fever cough or chills ? ? ? ? ?Medications ? arformoterol  15 mcg Nebulization BID  ? calcium acetate  667 mg Oral TID WC  ? carvedilol  12.5 mg Oral QPM  ? [START ON 09/25/2021] Chlorhexidine Gluconate Cloth  6 each Topical Daily  ? heparin  5,000 Units Subcutaneous Q8H  ? insulin aspart  0-5 Units Subcutaneous QHS  ? insulin aspart  0-6 Units Subcutaneous TID WC  ? metroNIDAZOLE  500 mg Oral Q8H  ? nicotine  21 mg Transdermal Daily  ? revefenacin  175 mcg Nebulization Daily  ? traZODone  50 mg Oral QPM  ? venlafaxine  37.5 mg Oral BID WC  ? ? ? ? ? ? ? ?IRJ:JOACZ from the symptoms mentioned above,there are no other symptoms referable to all systems reviewed. ? ?Physical Exam: ?Vital signs in last 24 hours: ?Temp:  [98.1 ?F (36.7 ?C)-98.9 ?F (37.2 ?C)] 98.1 ?F (36.7 ?C) (05/14 1550) ?Pulse Rate:  [73-91] 81 (05/14 1550) ?Resp:  [16-18] 18 (05/14 1550) ?BP: (119-162)/(57-89) 132/70 (05/14 1550) ?SpO2:  [93 %-100 %] 99 % (05/14 1550) ?Weight:  [81.6 kg] 81.6 kg (05/13 1741) ?Weight change:  ?Last BM Date : 09/23/21 ? ?Intake/Output from previous day: ?05/13 0701 - 05/14 0700 ?In: 350 [IV Piggyback:350] ?Out: -  ?Total I/O ?In: 240 [P.O.:240] ?Out: -  ? ? ?Physical Exam: ? ?General- pt is awake,alert, oriented to time place and person ? ?HEENT head is atraumatic , normocephalic ? ?Resp- No acute REsp distress,  CTA B/L NO Rhonchi ? ?CVS- S1S2 regular in rate and rhythm ? ?GIT- BS+, soft, Non tender , Non distended ? ?EXT- No LE Edema,  No Cyanosis ?         Left third finger of the hand has edema, there is scratches bilaterally on the arms ? ?Access- R   tunneled cath , Left AV graft ? ?Lab Results: ? ?CBC ? ?Recent Labs  ?  09/23/21 ?5885  ?WBC 7.4  ?HGB 12.7  ?HCT 40.8  ?PLT 279  ? ? ?BMET ? ?Recent Labs  ?  09/23/21 ?1743 09/24/21 ?1137  ?NA 139 136  ?K 5.3* 4.6  ?CL 100 98  ?CO2 23 26  ?GLUCOSE 90 122*  ?BUN 64* 69*  ?CREATININE 5.94* 6.38*  ?CALCIUM 9.0 8.7*  ? ? ? ? ?Most recent Creatinine trend  ?Lab  Results  ?Component Value Date  ? CREATININE 6.38 (H) 09/24/2021  ? CREATININE 5.94 (H) 09/23/2021  ? CREATININE 2.53 (H) 07/08/2021  ?  ? ? ?MICRO ? ? ?Recent Results (from the past 240 hour(s))  ?Blood culture (routine x 2)     Status: None (Preliminary result)  ? Collection Time: 09/23/21  9:43 PM  ? Specimen: BLOOD  ?Result Value Ref Range Status  ? Specimen Description BLOOD BLOOD RIGHT FOREARM  Final  ? Special Requests   Final  ?  BOTTLES DRAWN AEROBIC AND ANAEROBIC Blood Culture results may not be optimal due to an excessive volume of blood received in culture bottles  ? Culture   Final  ?  NO GROWTH < 12 HOURS ?Performed at [facility], [address] [place], [place] [address] ?  ? Report Status PENDING  Incomplete  ?Blood culture (routine x 2)     Status: None (Preliminary result)  ? Collection Time: 09/23/21  9:44 PM  ? Specimen: BLOOD  ?Result Value Ref Range Status  ? Specimen Description BLOOD BLOOD RIGHT HAND  Final  ? Special Requests   Final  ?  BOTTLES DRAWN AEROBIC AND ANAEROBIC Blood Culture results may not be optimal due to an excessive volume of blood received in culture bottles  ? Culture   Final  ?  NO GROWTH < 12 HOURS ?Performed at [facility], [address], [place], [place] [address] ?  ? Report Status PENDING  Incomplete  ?  ? ? ? ? ? ?Impression: ? ? ?1)Renal   ? ?[facility]/TTS/Rt Permcath/84 kg ?  ?End-stage renal disease on hemodialysis.   ? ?Patient is intubated Thursday Saturday schedule ?Patient did miss her dialysis on Saturday ?Patient is currently clinically stable ?We will dialyze patient in the morning ? ? ? ?2)HTN ? ? ? ?Blood pressure is stable  ? ? ?3)Anemia of chronic disease ? ? ?  Latest Ref Rng & Units 09/23/2021  ?  5:43 PM 07/08/2021  ? 11:41 AM 07/04/2021  ? 12:05 PM  ?CBC  ?WBC 4.0 - 10.5 K/uL 7.4   6.5   6.1    ?Hemoglobin 12.0 - 15.0 g/dL 12.7   10.6   10.5    ?Hematocrit 36.0 - 46.0 % 40.8   34.0   33.6    ?Platelets 150 - 400  K/uL 279   253   233    ?  ? ? ?Patient hemoglobin is more than 12 ?No need for Epogen ? ? ? ?4) Secondary hyperparathyroidism -CKD Mineral-Bone Disorder ? ? ? ?Lab Results  ?Component Value Date  ? PTH  70 (H) 05/12/2020  ? CALCIUM 8.7 (L) 09/24/2021  ? PHOS 7.4 (H) 09/24/2021  ? ? ?Secondary Hyperparathyroidism  present ? ?Phosphorus is not at goal. ?Patient phosphorus is on the higher side most likely due to her missing dialysis ?We will continue patient on binders ? ?5)Chronic diastolic heart failure ?Patient is well compensated ? ?6) Electrolytes  ? ? ?  Latest Ref Rng & Units 09/24/2021  ? 11:37 AM 09/23/2021  ?  5:43 PM 07/08/2021  ? 11:41 AM  ?BMP  ?Glucose 70 - 99 mg/dL 122   90   126    ?BUN 8 - 23 mg/dL 69   64   18    ?Creatinine 0.44 - 1.00 mg/dL 6.38   5.94   2.53    ?Sodium 135 - 145 mmol/L 136   139   137    ?Potassium 3.5 - 5.1 mmol/L 4.6   5.3   3.2    ?Chloride 98 - 111 mmol/L 98   100   97    ?CO2 22 - 32 mmol/L 26   23   28     ?Calcium 8.9 - 10.3 mg/dL 8.7   9.0   8.5    ?  ? ?Sodium ?Normonatremic ? ? ?Potassium ?Hyperkalemic ?Now better ? ? ? ? ?7)Acid base ? ? ? ?Co2 at goal ? ? ?8)Infected dog bite/Cellulitis ?Primary team is following ?Patient is on empiric antibiotics ? ?Plan: ? ?Patient missed her hemodialysis session on Saturday ?No need for hemodialysis today ?We will dialyze patient tomorrow ? ? ? ? ? ?Siddhant Hashemi s Theador Hawthorne ?09/24/2021, 4:49 PM ? ?

## 2021-09-24 NOTE — Plan of Care (Signed)
  Problem: Activity: Goal: Risk for activity intolerance will decrease Outcome: Progressing   Problem: Nutrition: Goal: Adequate nutrition will be maintained Outcome: Progressing   Problem: Coping: Goal: Level of anxiety will decrease Outcome: Progressing   Problem: Pain Managment: Goal: General experience of comfort will improve Outcome: Progressing   

## 2021-09-24 NOTE — ED Notes (Signed)
RN to bedside. Pt had just ambulated to bed from toilet.  ?

## 2021-09-24 NOTE — Progress Notes (Signed)
Mobility Specialist - Progress Note ? ? 09/24/21 1300  ?Mobility  ?Activity Ambulated with assistance in room;Transferred from chair to bed  ?Level of Assistance Contact guard assist, steadying assist  ?Assistive Device None  ?Distance Ambulated (ft) 5 ft  ?Activity Response Tolerated well  ?$Mobility charge 1 Mobility  ? ? ? ?Pt in recliner upon arrival using RA. Completes STS MinA and C-B w/ CGA. All bed mobility completed ModI. Pt is left in bed with needs in reach. ? ?Merrily Brittle ?Mobility Specialist ?09/24/21, 1:07 PM ? ? ? ? ?

## 2021-09-24 NOTE — ED Notes (Signed)
Pt stated she is not a diabetic. She takes no medications for diabetes and would like to NOT check her sugar multiple times a day. Pt let me check it for am order.  ?

## 2021-09-24 NOTE — Progress Notes (Signed)
?PROGRESS NOTE ? ? ? ?Katie [name]  KNL:[id number] DOB: 1955-11-28 DOA: 09/23/2021 ?PCP: Pcp, No  ? ?Brief Narrative:  ?66 year old with history of ESRD on HD TTS, DM 2, HTN came to the hospital with with complaints of left arm swelling and redness after a dog bite 2 days prior to admission.  Per patient dogs are up-to-date with vaccination.  Hand x-ray did not show any bony involvement, chest x-ray showed small bilateral pleural effusion.  She also had some flank pain therefore CT renal study was done which showed trace bilateral pleural effusion, layering density in gallbladder.  She was started on IV vancomycin and cefepime. ? ? ?Assessment & Plan: ? Principal Problem: ?  Infected dog bite of hand including fingers, left, initial encounter ?Active Problems: ?  Hyperkalemia ?  Right flank pain ?  Hypertension ?  Type 2 diabetes mellitus ([place]) ?  ESRD on dialysis [facility]) ?  Depression, unspecified ?  ? ? ?Assessment and Plan: ?* Infected dog bite of hand including fingers, left, initial encounter ?Patient is allergic to penicillin.  Follow culture data. ?Empiric antibiotics-cefepime, Flagyl and vancomycin ?- Pain control.  She is also up-to-date with her tetanus vaccination.  Dogs also up-to-date with vaccination per the patient. ?Routine wound care.  Clinically monitor, if necessary will obtain MRI ? ?Acute Bronchitis, COPD ?Tobacco use ?Bronchodilators ordered, IS Flutter. Supportive care.  ? ?Right flank pain with urinary tract infection ?CT does not show any acute findings such as pyelonephritis.  Already on IV cefepime ?Follow culture data ?Right upper quadrant shows slight gallbladder sludge.  LFTs unremarkable. ? ?Hyperkalemia ?Admission potassium 5.3.  Should improve with hemodialysis. ? ?Depression, unspecified ?On home venlafaxine and trazodone ? ?ESRD on dialysis [facility]) ?Nephrology consulted.  Continue PhosLo ? ?Type 2 diabetes mellitus ([place]) ?Accu-Cheks and sliding scale ? ?Hypertension ?Continue Coreg.  IV  hydralazine and Lopressor as needed ? ? ? ? ?DVT prophylaxis: Subcu heparin ?Code Status: Full code ?Family Communication:   ? ? ? ?Subjective: ?Coughing, nonproductive ?Left Upper extremity pain from dog bites.  ? ?Examination: ? ?General exam: Appears calm and comfortable  ?Respiratory system: b/l rhonchi ?Cardiovascular system: S1 & S2 heard, RRR. No JVD, murmurs, rubs, gallops or clicks. No pedal edema. ?Gastrointestinal system: Abdomen is nondistended, soft and nontender. No organomegaly or masses felt. Normal bowel sounds heard. ?Central nervous system: Alert and oriented. No focal neurological deficits. ?Extremities: Symmetric 5 x 5 power. ?Skin: LUE cellulitis, erythema, tender to touch ?Psychiatry: Judgement and insight appear normal. Mood & affect appropriate.  ? ? ? ? ? ? ?Objective: ?Vitals:  ? 09/24/21 0300 09/24/21 0400 09/24/21 0600 09/24/21 0649  ?BP: (!) 144/60 137/64 (!) 119/57 (!) 119/57  ?Pulse: 84 78 73 73  ?Resp:    16  ?Temp:      ?TempSrc:      ?SpO2: 95% 93% 93% 95%  ?Weight:      ?Height:      ? ? ?Intake/Output Summary (Last 24 hours) at 09/24/2021 0805 ?Last data filed at 09/24/2021 7902 ?Gross per 24 hour  ?Intake 350 ml  ?Output --  ?Net 350 ml  ? ?Filed Weights  ? 09/23/21 1741  ?Weight: 81.6 kg  ? ? ? ?Data Reviewed:  ? ?CBC: ?Recent Labs  ?Lab 09/23/21 ?4097  ?WBC 7.4  ?HGB 12.7  ?HCT 40.8  ?MCV 96.9  ?PLT 279  ? ?Basic Metabolic Panel: ?Recent Labs  ?Lab 09/23/21 ?3532  ?NA 139  ?K 5.3*  ?CL 100  ?CO2  23  ?GLUCOSE 90  ?BUN 64*  ?CREATININE 5.94*  ?CALCIUM 9.0  ? ?GFR: ?Estimated Creatinine Clearance: 10.4 mL/min (A) (by C-G formula based on SCr of 5.94 mg/dL (H)). ?Liver Function Tests: ?Recent Labs  ?Lab 09/23/21 ?2119  ?AST 22  ?ALT 25  ?ALKPHOS 111  ?BILITOT 0.6  ?PROT 7.0  ?ALBUMIN 3.5  ? ?Recent Labs  ?Lab 09/23/21 ?4174  ?LIPASE 39  ? ?No results for input(s): AMMONIA in the last 168 hours. ?Coagulation Profile: ?No results for input(s): INR, PROTIME in the last 168  hours. ?Cardiac Enzymes: ?No results for input(s): CKTOTAL, CKMB, CKMBINDEX, TROPONINI in the last 168 hours. ?BNP (last 3 results) ?No results for input(s): PROBNP in the last 8760 hours. ?HbA1C: ?Recent Labs  ?  09/23/21 ?0814  ?HGBA1C 4.7*  ? ?CBG: ?Recent Labs  ?Lab 09/24/21 ?0031 09/24/21 ?0759  ?GLUCAP 88 81  ? ?Lipid Profile: ?No results for input(s): CHOL, HDL, LDLCALC, TRIG, CHOLHDL, LDLDIRECT in the last 72 hours. ?Thyroid Function Tests: ?No results for input(s): TSH, T4TOTAL, FREET4, T3FREE, THYROIDAB in the last 72 hours. ?Anemia Panel: ?No results for input(s): VITAMINB12, FOLATE, FERRITIN, TIBC, IRON, RETICCTPCT in the last 72 hours. ?Sepsis Labs: ?Recent Labs  ?Lab 09/23/21 ?2143  ?PROCALCITON 0.15  ? ? ?No results found for this or any previous visit (from the past 240 hour(s)).  ? ? ? ? ? ?Radiology Studies: ?DG Chest 2 View ? ?Result Date: 09/23/2021 ?CLINICAL DATA:  Coughing congestion EXAM: CHEST - 2 VIEW COMPARISON:  06/01/2021 FINDINGS: Frontal and lateral views of the chest demonstrate a stable cardiac silhouette. Small bilateral pleural effusions are again noted. Patchy consolidation at the left lung base consistent with atelectasis. No new airspace disease or pneumothorax. Stable right internal jugular dialysis catheter. No acute bony abnormalities. IMPRESSION: 1. Stable small bilateral pleural effusions. 2. Patchy left basilar atelectasis. 3. No acute airspace disease. Electronically Signed   By: Randa Ngo M.D.   On: 09/23/2021 20:45  ? ?DG Hand Complete Left ? ?Result Date: 09/23/2021 ?CLINICAL DATA:  Left hand swelling, pain EXAM: LEFT HAND - COMPLETE 3+ VIEW COMPARISON:  None Available. FINDINGS: Degenerative changes in the wrist. No acute bony abnormality. Specifically, no fracture, subluxation, or dislocation. IMPRESSION: No acute bony abnormality. Electronically Signed   By: Rolm Baptise M.D.   On: 09/23/2021 22:35  ? ?CT Renal Stone Study ? ?Result Date: 09/23/2021 ?CLINICAL DATA:   Flank pain, kidney stone suspected. Right flank pain EXAM: CT ABDOMEN AND PELVIS WITHOUT CONTRAST TECHNIQUE: Multidetector CT imaging of the abdomen and pelvis was performed following the standard protocol without IV contrast. RADIATION DOSE REDUCTION: This exam was performed according to the departmental dose-optimization program which includes automated exposure control, adjustment of the mA and/or kV according to patient size and/or use of iterative reconstruction technique. COMPARISON:  12/12/2020 FINDINGS: Lower chest: Airspace opacity in the left lower lobe could reflect atelectasis or infiltrate. This may reflect atelectatic/collapsed left lower lobe. Trace bilateral effusions, decreased since prior study. Hepatobiliary: Layering high-density material within the gallbladder could reflect small stones or sludge. No focal hepatic abnormality. Pancreas: No focal abnormality or ductal dilatation. Spleen: No focal abnormality.  Normal size. Adrenals/Urinary Tract: Renovascular calcifications. No renal or ureteral stones. No hydronephrosis. No renal or adrenal mass. Urinary bladder unremarkable Stomach/Bowel: Extensive sigmoid diverticulosis. No active diverticulitis. Stomach and small bowel decompressed, unremarkable. Vascular/Lymphatic: Heavily calcified aorta and iliac vessels. No evidence of aneurysm or adenopathy. Reproductive: Uterus and adnexa unremarkable.  No mass. Other: No  free fluid or free air. Musculoskeletal: No acute bony abnormality. IMPRESSION: Trace bilateral pleural effusions. Left lower lobe atelectasis or infiltrate. Layering high-density material in the gallbladder could reflect small stones or sludge. Extensive sigmoid diverticulosis.  No active diverticulitis. Aortoiliac atherosclerosis. No acute findings in the abdomen or pelvis. Electronically Signed   By: Rolm Baptise M.D.   On: 09/23/2021 20:33  ? ?US Abdomen Limited RUQ (LIVER/GB) ? ?Result Date: 09/23/2021 ?CLINICAL DATA:  Right upper  quadrant pain EXAM: ULTRASOUND ABDOMEN LIMITED RIGHT UPPER QUADRANT COMPARISON:  None Available. FINDINGS: Gallbladder: No definite gallstones or wall thickening visualized. Possible small amount of sludge. No

## 2021-09-24 NOTE — Evaluation (Signed)
Physical Therapy Evaluation ?Patient Details ?Name: Katie [name] ?MRN: [id number] ?DOB: Oct 24, 1955 ?Today's Date: 09/24/2021 ? ?History of Present Illness ? Katie [name] is a 7yoF who [facility] c increased redness, swellin gon left arm wound sustained 2 days prior while seperating 2 fighting dogs. PMH: ESRD on HD TTS, DM, HTN, frequet falls, recurrent syncope, orthostatic hypotension. Pt missed her dialysis on 09/23/21.  ?Clinical Impression ? Pt admitted c above Dx. Pt shows functional limitations due to the deficits listed below (see "PT Problem List"). Patient agreeable to PT evaluation. PLOF and home setup obtained. Pt able to perform transfers and AMB with typical effort and performance, however these are complicated by painful wound on left hand which preclude ability to use Left SPC effectively (pt uses 2 Manor at baseline). Patient's assessment reveals acute need for additional person for safety and/or physical assistance to complete their typical ADL. At baseline, the patient is able to perform ADL with modified independence. Patient will benefit from skilled PT intervention to maximize independence and safety in mobility required for basic ADL performance at discharge.   ?   ?   ? ?Recommendations for follow up therapy are one component of a multi-disciplinary discharge planning process, led by the attending physician.  Recommendations may be updated based on patient status, additional functional criteria and insurance authorization. ? ?Follow Up Recommendations No PT follow up ? ?  ?Assistance Recommended at Discharge PRN  ?Patient can return home with the following ?   ? ?  ?Equipment Recommendations None recommended by PT  ?Recommendations for Other Services ?    ?  ?Functional Status Assessment Patient has not had a recent decline in their functional status  ? ?  ?Precautions / Restrictions Precautions ?Precautions: Fall ?Restrictions ?Weight Bearing Restrictions: No  ? ?  ? ?Mobility ? Bed  Mobility ?  ?  ?  ?  ?  ?  ?  ?General bed mobility comments: in recliner ?  ? ?Transfers ?Overall transfer level: Needs assistance ?Equipment used: Straight cane ?Transfers: Sit to/from Stand ?Sit to Stand: Min guard ?  ?  ?  ?  ?  ?  ?  ? ?Ambulation/Gait ?Ambulation/Gait assistance: Min assist, Min guard ?Gait Distance (Feet): 100 Feet ?Assistive device: Straight cane ?  ?  ?  ?  ?General Gait Details: easy LOB when turning; author provides minA for 1 LOB, minGUard for remaining distance ? ?Stairs ?  ?  ?  ?  ?  ? ?Wheelchair Mobility ?  ? ?Modified Rankin (Stroke Patients Only) ?  ? ?  ? ?Balance Overall balance assessment: History of Falls, Needs assistance ?  ?  ?  ?  ?Standing balance support: Bilateral upper extremity supported, Reliant on assistive device for balance ?  ?  ?  ?  ?  ?  ?  ?  ?  ?  ?  ?  ?  ?   ? ? ? ?Pertinent Vitals/Pain Pain Assessment ?Pain Assessment: 0-10 ?Pain Score: 8  ?Pain Location: left hand wound ?Pain Descriptors / Indicators: Aching ?Pain Intervention(s): Limited activity within patient's tolerance, Premedicated before session, Repositioned  ? ? ?Home Living Family/patient expects to be discharged to:: Private residence ?Living Arrangements: Non-relatives/Friends ?Available Help at Discharge: Friend(s) ?  ?Home Access: Stairs to enter ?Entrance Stairs-Rails: Right ?Entrance Stairs-Number of Steps: 4 ?  ?Home Layout: One level ?Home Equipment: Rollator (4 wheels);Cane - single point ?Additional Comments: household AMB with 2 SPC, has a rollator for longer  distances; uses tranportation for HD  ?  ?Prior Function Prior Level of Function : Independent/Modified Independent ?  ?  ?  ?  ?  ?  ?Mobility Comments: does not drive ?  ?  ? ? ?Hand Dominance  ?   ? ?  ?Extremity/Trunk Assessment  ? Upper Extremity Assessment ?Upper Extremity Assessment: Overall WFL for tasks assessed;Generalized weakness ?  ? ?Lower Extremity Assessment ?Lower Extremity Assessment: Generalized  weakness;Overall [facility] for tasks assessed ?  ? ?   ?Communication  ?    ?Cognition Arousal/Alertness: Awake/alert ?Behavior During Therapy: [facility] for tasks assessed/performed ?Overall Cognitive Status: Within Functional Limits for tasks assessed ?  ?  ?  ?  ?  ?  ?  ?  ?  ?  ?  ?  ?  ?  ?  ?  ?  ?  ?  ? ?  ?General Comments   ? ?  ?Exercises    ? ?Assessment/Plan  ?  ?PT Assessment Patient needs continued PT services  ?PT Problem List Decreased strength;Decreased range of motion;Decreased activity tolerance;Decreased balance;Decreased mobility;Decreased coordination;Decreased cognition ? ?   ?  ?PT Treatment Interventions DME instruction;Balance training;Gait training;Stair training;Functional mobility training;Therapeutic activities;Therapeutic exercise;Patient/family education   ? ?PT Goals (Current goals can be found in the Care Plan section)  ?Acute Rehab PT Goals ?Patient Stated Goal: retun to home, resolve pain, maintain independence ?PT Goal Formulation: With patient ?Time For Goal Achievement: 10/08/21 ?Potential to Achieve Goals: Good ? ?  ?Frequency Min 2X/week ?  ? ? ?Co-evaluation   ?  ?  ?  ?  ? ? ?  ?AM-PAC PT "6 Clicks" Mobility  ?Outcome Measure Help needed turning from your back to your side while in a flat bed without using bedrails?: A Little ?Help needed moving from lying on your back to sitting on the side of a flat bed without using bedrails?: A Little ?Help needed moving to and from a bed to a chair (including a wheelchair)?: A Little ?Help needed standing up from a chair using your arms (e.g., wheelchair or bedside chair)?: A Little ?Help needed to walk in hospital room?: A Lot ?Help needed climbing 3-5 steps with a railing? : A Lot ?6 Click Score: 16 ? ?  ?End of Session Equipment Utilized During Treatment: Gait belt ?Activity Tolerance: Patient tolerated treatment well;Patient limited by pain ?Patient left: in chair;with nursing/sitter in room;with call bell/phone within reach ?Nurse  Communication: Mobility status ?PT Visit Diagnosis: Difficulty in walking, not elsewhere classified (R26.2);Other abnormalities of gait and mobility (R26.89);Repeated falls (R29.6);History of falling (Z91.81) ?  ? ?Time: 0932-6712 ?PT Time Calculation (min) (ACUTE ONLY): 13 min ? ? ?Charges:   PT Evaluation ?$PT Eval Moderate Complexity: 1 Mod ?  ?  ?   ? ?10:55 AM, 09/24/21 ?Etta Grandchild, PT, DPT ?Physical Therapist - [place] ?[facility]  ?[phone] (ASCOM)  ? ? ?Burney Calzadilla C ?09/24/2021, 10:54 AM ? ?

## 2021-09-25 DIAGNOSIS — W540XXA Bitten by dog, initial encounter: Secondary | ICD-10-CM | POA: Diagnosis not present

## 2021-09-25 DIAGNOSIS — L089 Local infection of the skin and subcutaneous tissue, unspecified: Secondary | ICD-10-CM | POA: Diagnosis not present

## 2021-09-25 DIAGNOSIS — S61259A Open bite of unspecified finger without damage to nail, initial encounter: Secondary | ICD-10-CM | POA: Diagnosis not present

## 2021-09-25 DIAGNOSIS — S61452A Open bite of left hand, initial encounter: Secondary | ICD-10-CM | POA: Diagnosis not present

## 2021-09-25 LAB — CBC
HCT: 36.1 % (ref 36.0–46.0)
Hemoglobin: 11.2 g/dL — ABNORMAL LOW (ref 12.0–15.0)
MCH: 30.2 pg (ref 26.0–34.0)
MCHC: 31 g/dL (ref 30.0–36.0)
MCV: 97.3 fL (ref 80.0–100.0)
Platelets: 265 10*3/uL (ref 150–400)
RBC: 3.71 MIL/uL — ABNORMAL LOW (ref 3.87–5.11)
RDW: 15.6 % — ABNORMAL HIGH (ref 11.5–15.5)
WBC: 5.9 10*3/uL (ref 4.0–10.5)
nRBC: 0 % (ref 0.0–0.2)

## 2021-09-25 LAB — BASIC METABOLIC PANEL
Anion gap: 17 — ABNORMAL HIGH (ref 5–15)
BUN: 77 mg/dL — ABNORMAL HIGH (ref 8–23)
CO2: 20 mmol/L — ABNORMAL LOW (ref 22–32)
Calcium: 8.8 mg/dL — ABNORMAL LOW (ref 8.9–10.3)
Chloride: 101 mmol/L (ref 98–111)
Creatinine, Ser: 7.27 mg/dL — ABNORMAL HIGH (ref 0.44–1.00)
GFR, Estimated: 6 mL/min — ABNORMAL LOW (ref >60)
Glucose, Bld: 101 mg/dL — ABNORMAL HIGH (ref 70–99)
Potassium: 4.7 mmol/L (ref 3.5–5.1)
Sodium: 138 mmol/L (ref 135–145)

## 2021-09-25 LAB — GLUCOSE, CAPILLARY
Glucose-Capillary: 104 mg/dL — ABNORMAL HIGH (ref 70–99)
Glucose-Capillary: 87 mg/dL (ref 70–99)
Glucose-Capillary: 138 mg/dL — ABNORMAL HIGH (ref 70–99)
Glucose-Capillary: 98 mg/dL (ref 70–99)

## 2021-09-25 LAB — URINE CULTURE

## 2021-09-25 LAB — MAGNESIUM: Magnesium: 2.2 mg/dL (ref 1.7–2.4)

## 2021-09-25 LAB — HIV ANTIBODY (ROUTINE TESTING W REFLEX): HIV Screen 4th Generation wRfx: NONREACTIVE

## 2021-09-25 LAB — HEPATITIS B CORE ANTIBODY, TOTAL: Hep B Core Total Ab: NONREACTIVE

## 2021-09-25 LAB — HEPATITIS B SURFACE ANTIGEN: Hepatitis B Surface Ag: NONREACTIVE

## 2021-09-25 MED ORDER — PNEUMOCOCCAL 20-VAL CONJ VACC 0.5 ML IM SUSY
0.5000 mL | PREFILLED_SYRINGE | INTRAMUSCULAR | Status: DC | PRN
  Filled 2021-09-25: qty 0.5

## 2021-09-25 MED ORDER — COVID-19MRNA BIVAL VACC PFIZER 30 MCG/0.3ML IM SUSP: 0.3000 mL | INTRAMUSCULAR | Status: DC | PRN

## 2021-09-25 MED ORDER — PENTAFLUOROPROP-TETRAFLUOROETH EX AERO: 1.0000 "application " | INHALATION_SPRAY | CUTANEOUS | Status: DC | PRN

## 2021-09-25 MED ORDER — SODIUM CHLORIDE 0.9 % IV SOLN: 100.0000 mL | INTRAVENOUS | Status: DC | PRN

## 2021-09-25 MED ORDER — LIDOCAINE-PRILOCAINE 2.5-2.5 % EX CREA: 1.0000 "application " | TOPICAL_CREAM | CUTANEOUS | Status: DC | PRN

## 2021-09-25 MED ORDER — HEPARIN SODIUM (PORCINE) 1000 UNIT/ML DIALYSIS: 1000.0000 [IU] | INTRAMUSCULAR | Status: DC | PRN

## 2021-09-25 MED ORDER — LIDOCAINE HCL (PF) 1 % IJ SOLN
5.0000 mL | INTRAMUSCULAR | Status: DC | PRN
  Filled 2021-09-25: qty 5

## 2021-09-25 MED ORDER — AMLODIPINE BESYLATE 10 MG PO TABS
10.0000 mg | ORAL_TABLET | Freq: Every day | ORAL | Status: DC
  Administered 2021-09-26: 10 mg via ORAL
  Filled 2021-09-25: qty 1

## 2021-09-25 MED ORDER — HEPARIN SODIUM (PORCINE) 1000 UNIT/ML IJ SOLN
INTRAMUSCULAR | Status: AC
  Filled 2021-09-25: qty 10

## 2021-09-25 MED ORDER — ALTEPLASE 2 MG IJ SOLR: 2.0000 mg | Freq: Once | INTRAMUSCULAR | Status: DC | PRN

## 2021-09-25 MED ORDER — HEPARIN SODIUM (PORCINE) 1000 UNIT/ML DIALYSIS: 20.0000 [IU]/kg | INTRAMUSCULAR | Status: DC | PRN

## 2021-09-25 NOTE — Progress Notes (Signed)
OT Cancellation Note ? ?Patient Details ?Name: Katie [name] ?MRN: [id number] ?DOB: [birth year] ? ? ?Cancelled Treatment:    Reason Eval/Treat Not Completed: Patient at procedure or test/ unavailable ? ?OT consult received.  Attempted to engage pt in evaluation, but pt current in dialysis.  Will continue to follow up. ? ?Jeneen Montgomery, OTR/L ?09/25/21, 9:42 AM ? ?

## 2021-09-25 NOTE — Progress Notes (Signed)
Hemodialysis Post Treatment Note ? ?25 Sep 2021 ? ?Access: ?RIJ Catheter  ? ?Treatment Time: ?3-Hours ? ?UF Removed: ?1374 ml ? ?Next Scheduled Treatment: ?09/27/2021 ? ?Note:  ? Patient presents for treatment without concern, wounds from dog bite shows evidence of healing. CVC intact, no signs of infection, dressing changed. Labs drawn per order. Patient slightly below targeted UF due to CVC malfunctioning, patient treatment ended with less than 10 minutes to due concerns for clotting. Patient will be treated prior initiation of treatment at next session. No meds given. Patient transported to assigned room, report given to primary nurse.   ?

## 2021-09-25 NOTE — Progress Notes (Signed)
?[facility]  ?ROUNDING NOTE  ? ?Subjective:  ? ?Patient is a 66 year old female with past medical history of ESRD with hemodialysis TTS, diabetes mellitus, hypertension to the hospital with the left arm swelling and redness after dog bite 2 days prior to the admission.  Patient also noted to have bilateral pleural effusion and layering density in gallbladder. ? ?Patient was seen and examined in the dialysis unit and she is currently receiving hemodialysis. Awake alert and oriented x3 and following commands.  She denies any complaints at present.  ? ? ?Objective:  ?Vital signs in last 24 hours:  ?Temp:  [97.8 ?F (36.6 ?C)-98.7 ?F (37.1 ?C)] 98.3 ?F (36.8 ?C) (05/15 1206) ?Pulse Rate:  [68-86] 68 (05/15 1206) ?Resp:  [9-26] 12 (05/15 1130) ?BP: (100-179)/(53-78) 108/60 (05/15 1206) ?SpO2:  [94 %-100 %] 99 % (05/15 1206) ?Weight:  [81.3 kg-83.2 kg] 81.3 kg (05/15 1206) ? ?Weight change:  ?Filed Weights  ? 09/23/21 1741 09/25/21 0910 09/25/21 1206  ?Weight: 81.6 kg 83.2 kg 81.3 kg  ? ? ?Intake/Output: ?I/O last 3 completed shifts: ?In: 29 [P.O.:480; IV Piggyback:350] ?Out: -  ?  ?Intake/Output this shift: ? Total I/O ?In: -  ?Out: 1374 [Other:1374] ? ?Physical Exam: ?General: No acute distress  ?Head: Normocephalic, atraumatic. Moist oral mucosal membranes  ?Eyes: Anicteric  ?Neck: Supple  ?Lungs:  Clear to auscultation, normal effort  ?Heart: S1S2 no rubs  ?Abdomen:  Soft, nontender, bowel sounds present  ?Extremities: No lower extremity edema or cyanosis.  Left third finger edema and erythema  ?Neurologic: Awake, alert, following commands  ?Skin: No acute rash  ?Access: R Permacath.  L AVG  ? ? ?Basic Metabolic Panel: ?Recent Labs  ?Lab 09/23/21 ?1743 09/24/21 ?1137 09/25/21 ?0503  ?NA 139 136 138  ?K 5.3* 4.6 4.7  ?CL 100 98 101  ?CO2 23 26 20*  ?GLUCOSE 90 122* 101*  ?BUN 64* 69* 77*  ?CREATININE 5.94* 6.38* 7.27*  ?CALCIUM 9.0 8.7* 8.8*  ?MG  --   --  2.2  ?PHOS  --  7.4*  --   ? ? ?Liver Function  Tests: ?Recent Labs  ?Lab 09/23/21 ?2992  ?AST 22  ?ALT 25  ?ALKPHOS 111  ?BILITOT 0.6  ?PROT 7.0  ?ALBUMIN 3.5  ? ?Recent Labs  ?Lab 09/23/21 ?4268  ?LIPASE 39  ? ?No results for input(s): AMMONIA in the last 168 hours. ? ?CBC: ?Recent Labs  ?Lab 09/23/21 ?1743 09/25/21 ?0503  ?WBC 7.4 5.9  ?HGB 12.7 11.2*  ?HCT 40.8 36.1  ?MCV 96.9 97.3  ?PLT 279 265  ? ? ?Cardiac Enzymes: ?No results for input(s): CKTOTAL, CKMB, CKMBINDEX, TROPONINI in the last 168 hours. ? ?BNP: ?Invalid input(s): POCBNP ? ?CBG: ?Recent Labs  ?Lab 09/24/21 ?0031 09/24/21 ?0759 09/24/21 ?1155 09/24/21 ?2210 09/25/21 ?0758  ?GLUCAP 88 81 133* 91 104*  ? ? ?Microbiology: ?Results for orders placed or performed during the hospital encounter of 09/23/21  ?Blood culture (routine x 2)     Status: None (Preliminary result)  ? Collection Time: 09/23/21  9:43 PM  ? Specimen: BLOOD  ?Result Value Ref Range Status  ? Specimen Description BLOOD BLOOD RIGHT FOREARM  Final  ? Special Requests   Final  ?  BOTTLES DRAWN AEROBIC AND ANAEROBIC Blood Culture results may not be optimal due to an excessive volume of blood received in culture bottles  ? Culture   Final  ?  NO GROWTH 2 DAYS ?Performed at [facility], [place],  [place], [place] [address] ?  ? Report Status PENDING  Incomplete  ?Urine Culture     Status: Abnormal  ? Collection Time: 09/23/21  9:43 PM  ? Specimen: Urine, Random  ?Result Value Ref Range Status  ? Specimen Description   Final  ?  URINE, RANDOM ?Performed at [facility], [address], [place], [place] [address] ?  ? Special Requests   Final  ?  NONE ?Performed at [facility], [address], [place], [place] [address] ?  ? Culture MULTIPLE SPECIES PRESENT, SUGGEST RECOLLECTION (A)  Final  ? Report Status 09/25/2021 FINAL  Final  ?Blood culture (routine x 2)     Status: None (Preliminary result)  ? Collection Time: 09/23/21  9:44 PM  ? Specimen: BLOOD  ?Result Value Ref Range Status  ? Specimen  Description BLOOD BLOOD RIGHT HAND  Final  ? Special Requests   Final  ?  BOTTLES DRAWN AEROBIC AND ANAEROBIC Blood Culture results may not be optimal due to an excessive volume of blood received in culture bottles  ? Culture   Final  ?  NO GROWTH 2 DAYS ?Performed at [facility], [address], [place],  [address] ?  ? Report Status PENDING  Incomplete  ?MRSA Next Gen by PCR, Nasal     Status: None  ? Collection Time: 09/24/21  6:57 PM  ? Specimen: Nasal Mucosa; Nasal Swab  ?Result Value Ref Range Status  ? MRSA by PCR Next Gen NOT DETECTED NOT DETECTED Final  ?  Comment: (NOTE) ?The GeneXpert MRSA Assay (FDA approved for NASAL specimens only), ?is one component of a comprehensive MRSA colonization surveillance ?program. It is not intended to diagnose MRSA infection nor to guide ?or monitor treatment for MRSA infections. ?Test performance is not FDA approved in patients less than 2 years ?old. ?Performed at [facility], [place], ?[name] [address] ?  ? ? ?Coagulation Studies: ?No results for input(s): LABPROT, INR in the last 72 hours. ? ?Urinalysis: ?Recent Labs  ?  09/23/21 ?2143  ?COLORURINE YELLOW*  ?LABSPEC 1.012  ?PHURINE 9.0*  ?GLUCOSEU 150*  ?HGBUR NEGATIVE  ?BILIRUBINUR NEGATIVE  ?KETONESUR 5*  ?PROTEINUR 100*  ?NITRITE NEGATIVE  ?LEUKOCYTESUR LARGE*  ?  ? ? ?Imaging: ?DG Chest 2 View ? ?Result Date: 09/23/2021 ?CLINICAL DATA:  Coughing congestion EXAM: CHEST - 2 VIEW COMPARISON:  06/01/2021 FINDINGS: Frontal and lateral views of the chest demonstrate a stable cardiac silhouette. Small bilateral pleural effusions are again noted. Patchy consolidation at the left lung base consistent with atelectasis. No new airspace disease or pneumothorax. Stable right internal jugular dialysis catheter. No acute bony abnormalities. IMPRESSION: 1. Stable small bilateral pleural effusions. 2. Patchy left basilar atelectasis. 3. No acute airspace disease. Electronically Signed    By: Randa Ngo M.D.   On: 09/23/2021 20:45  ? ?DG Hand Complete Left ? ?Result Date: 09/23/2021 ?CLINICAL DATA:  Left hand swelling, pain EXAM: LEFT HAND - COMPLETE 3+ VIEW COMPARISON:  None Available. FINDINGS: Degenerative changes in the wrist. No acute bony abnormality. Specifically, no fracture, subluxation, or dislocation. IMPRESSION: No acute bony abnormality. Electronically Signed   By: Rolm Baptise M.D.   On: 09/23/2021 22:35  ? ?CT Renal Stone Study ? ?Result Date: 09/23/2021 ?CLINICAL DATA:  Flank pain, kidney stone suspected. Right flank pain EXAM: CT ABDOMEN AND PELVIS WITHOUT CONTRAST TECHNIQUE: Multidetector CT imaging of the abdomen and pelvis was performed following the standard protocol without IV contrast. RADIATION  DOSE REDUCTION: This exam was performed according to the departmental dose-optimization program which includes automated exposure control, adjustment of the mA and/or kV according to patient size and/or use of iterative reconstruction technique. COMPARISON:  12/12/2020 FINDINGS: Lower chest: Airspace opacity in the left lower lobe could reflect atelectasis or infiltrate. This may reflect atelectatic/collapsed left lower lobe. Trace bilateral effusions, decreased since prior study. Hepatobiliary: Layering high-density material within the gallbladder could reflect small stones or sludge. No focal hepatic abnormality. Pancreas: No focal abnormality or ductal dilatation. Spleen: No focal abnormality.  Normal size. Adrenals/Urinary Tract: Renovascular calcifications. No renal or ureteral stones. No hydronephrosis. No renal or adrenal mass. Urinary bladder unremarkable Stomach/Bowel: Extensive sigmoid diverticulosis. No active diverticulitis. Stomach and small bowel decompressed, unremarkable. Vascular/Lymphatic: Heavily calcified aorta and iliac vessels. No evidence of aneurysm or adenopathy. Reproductive: Uterus and adnexa unremarkable.  No mass. Other: No free fluid or free air.  Musculoskeletal: No acute bony abnormality. IMPRESSION: Trace bilateral pleural effusions. Left lower lobe atelectasis or infiltrate. Layering high-density material in the gallbladder could reflect small stones or slu

## 2021-09-25 NOTE — Progress Notes (Signed)
?PROGRESS NOTE ? ? ? ?Katie [name]  UEA:[id number] DOB: [birth year] DOA: 09/23/2021 ?PCP: Pcp, No  ? ?Brief Narrative:  ?66 year old with history of ESRD on HD TTS, DM 2, HTN came to the hospital with with complaints of left arm swelling and redness after a dog bite 2 days prior to admission.  Per patient dogs are up-to-date with vaccination.  Hand x-ray did not show any bony involvement, chest x-ray showed small bilateral pleural effusion.  She also had some flank pain therefore CT renal study was done which showed trace bilateral pleural effusion, layering density in gallbladder.  She was started on IV vancomycin and cefepime. ? ? ?Assessment & Plan: ? Principal Problem: ?  Infected dog bite of hand including fingers, left, initial encounter ?Active Problems: ?  Hyperkalemia ?  Right flank pain ?  Hypertension ?  Type 2 diabetes mellitus ([place]) ?  ESRD on dialysis [facility]) ?  Depression, unspecified ?  ? ? ?Assessment and Plan: ?* Infected dog bite of hand including fingers, left, initial encounter ?Patient is allergic to penicillin.  Follow culture data. ?Empiric antibiotics-cefepime, Flagyl and vancomycin.  Will de-escalate to cefepime only ?- Pain control.  She is also up-to-date with her tetanus vaccination.  Dogs also up-to-date with vaccination per the patient. ?Routine wound care.  No need for MRI at this time ? ?Acute Bronchitis, COPD ?Tobacco use ?Bronchodilators ordered, IS Flutter. Supportive care.  ? ?Right flank pain with urinary tract infection ?CT does not show any acute findings such as pyelonephritis.  Already on IV cefepime ?Follow culture data ?Right upper quadrant shows slight gallbladder sludge.  LFTs unremarkable. ? ?Hyperkalemia ?Admission potassium 5.3.  Should improve with hemodialysis. ? ?Depression, unspecified ?On home venlafaxine and trazodone ? ?ESRD on dialysis [facility]) ?Nephrology consulted.  Continue PhosLo ? ?Type 2 diabetes mellitus ([place]) ?Accu-Cheks and sliding  scale ? ?Hypertension ?Continue Coreg.  IV hydralazine and Lopressor as needed ? ? ? ? ?DVT prophylaxis: Subcu heparin ?Code Status: Full code ?Family Communication:   ? ?Continue 24 hours of IV antibiotics thereafter consider switching over to p.o. ? ?Subjective: ?Patient seen at hemodialysis, feels somewhat better.  Left upper extremity swelling is improved but still has pain even with minimal palpation. ?Examination: ? ?General exam: Appears calm and comfortable  ?Respiratory system: b/l rhonchi ?Cardiovascular system: S1 & S2 heard, RRR. No JVD, murmurs, rubs, gallops or clicks. No pedal edema. ?Gastrointestinal system: Abdomen is nondistended, soft and nontender. No organomegaly or masses felt. Normal bowel sounds heard. ?Central nervous system: Alert and oriented. No focal neurological deficits. ?Extremities: Symmetric 5 x 5 power. ?Skin: Left upper extremity cellulitis and erythema improved but still quite tender to limited palpation ?Psychiatry: Judgement and insight appear normal. Mood & affect appropriate.  ? ? ? ? ? ? ?Objective: ?Vitals:  ? 09/25/21 1115 09/25/21 1130 09/25/21 1145 09/25/21 1200  ?BP: (!) 108/56 (!) 102/56 (!) 105/56 107/61  ?Pulse: 72 71 70 69  ?Resp: 10 12    ?Temp:      ?TempSrc:      ?SpO2: 98% 98% 99% 98%  ?Weight:      ?Height:      ? ? ?Intake/Output Summary (Last 24 hours) at 09/25/2021 1207 ?Last data filed at 09/24/2021 1700 ?Gross per 24 hour  ?Intake 480 ml  ?Output --  ?Net 480 ml  ? ?Filed Weights  ? 09/23/21 1741 09/25/21 0910  ?Weight: 81.6 kg 83.2 kg  ? ? ? ?Data Reviewed:  ? ?CBC: ?Recent Labs  ?  Lab 09/23/21 ?1743 09/25/21 ?0503  ?WBC 7.4 5.9  ?HGB 12.7 11.2*  ?HCT 40.8 36.1  ?MCV 96.9 97.3  ?PLT 279 265  ? ?Basic Metabolic Panel: ?Recent Labs  ?Lab 09/23/21 ?1743 09/24/21 ?1137 09/25/21 ?0503  ?NA 139 136 138  ?K 5.3* 4.6 4.7  ?CL 100 98 101  ?CO2 23 26 20*  ?GLUCOSE 90 122* 101*  ?BUN 64* 69* 77*  ?CREATININE 5.94* 6.38* 7.27*  ?CALCIUM 9.0 8.7* 8.8*  ?MG  --   --  2.2   ?PHOS  --  7.4*  --   ? ?GFR: ?Estimated Creatinine Clearance: 8.6 mL/min (A) (by C-G formula based on SCr of 7.27 mg/dL (H)). ?Liver Function Tests: ?Recent Labs  ?Lab 09/23/21 ?9509  ?AST 22  ?ALT 25  ?ALKPHOS 111  ?BILITOT 0.6  ?PROT 7.0  ?ALBUMIN 3.5  ? ?Recent Labs  ?Lab 09/23/21 ?3267  ?LIPASE 39  ? ?No results for input(s): AMMONIA in the last 168 hours. ?Coagulation Profile: ?No results for input(s): INR, PROTIME in the last 168 hours. ?Cardiac Enzymes: ?No results for input(s): CKTOTAL, CKMB, CKMBINDEX, TROPONINI in the last 168 hours. ?BNP (last 3 results) ?No results for input(s): PROBNP in the last 8760 hours. ?HbA1C: ?Recent Labs  ?  09/23/21 ?1245  ?HGBA1C 4.7*  ? ?CBG: ?Recent Labs  ?Lab 09/24/21 ?0031 09/24/21 ?0759 09/24/21 ?1155 09/24/21 ?2210 09/25/21 ?0758  ?GLUCAP 88 81 133* 91 104*  ? ?Lipid Profile: ?No results for input(s): CHOL, HDL, LDLCALC, TRIG, CHOLHDL, LDLDIRECT in the last 72 hours. ?Thyroid Function Tests: ?No results for input(s): TSH, T4TOTAL, FREET4, T3FREE, THYROIDAB in the last 72 hours. ?Anemia Panel: ?No results for input(s): VITAMINB12, FOLATE, FERRITIN, TIBC, IRON, RETICCTPCT in the last 72 hours. ?Sepsis Labs: ?Recent Labs  ?Lab 09/23/21 ?2143  ?PROCALCITON 0.15  ? ? ?Recent Results (from the past 240 hour(s))  ?Blood culture (routine x 2)     Status: None (Preliminary result)  ? Collection Time: 09/23/21  9:43 PM  ? Specimen: BLOOD  ?Result Value Ref Range Status  ? Specimen Description BLOOD BLOOD RIGHT FOREARM  Final  ? Special Requests   Final  ?  BOTTLES DRAWN AEROBIC AND ANAEROBIC Blood Culture results may not be optimal due to an excessive volume of blood received in culture bottles  ? Culture   Final  ?  NO GROWTH 2 DAYS ?Performed at [facility], [address] [place], [place] [address] ?  ? Report Status PENDING  Incomplete  ?Urine Culture     Status: Abnormal  ? Collection Time: 09/23/21  9:43 PM  ? Specimen: Urine, Random  ?Result Value Ref Range  Status  ? Specimen Description   Final  ?  URINE, RANDOM ?Performed at [facility], [address] [place], [place] [address] ?  ? Special Requests   Final  ?  NONE ?Performed at [facility], [address] [place],  [address] ?  ? Culture MULTIPLE SPECIES PRESENT, SUGGEST RECOLLECTION (A)  Final  ? Report Status 09/25/2021 FINAL  Final  ?Blood culture (routine x 2)     Status: None (Preliminary result)  ? Collection Time: 09/23/21  9:44 PM  ? Specimen: BLOOD  ?Result Value Ref Range Status  ? Specimen Description BLOOD BLOOD RIGHT HAND  Final  ? Special Requests   Final  ?  BOTTLES DRAWN AEROBIC AND ANAEROBIC Blood Culture results may not be optimal due to an excessive volume of blood received in culture bottles  ?  Culture   Final  ?  NO GROWTH 2 DAYS ?Performed at [facility], [address] [place], [place] [address] ?  ? Report Status PENDING  Incomplete  ?MRSA Next Gen by PCR, Nasal     Status: None  ? Collection Time: 09/24/21  6:57 PM  ? Specimen: Nasal Mucosa; Nasal Swab  ?Result Value Ref Range Status  ? MRSA by PCR Next Gen NOT DETECTED NOT DETECTED Final  ?  Comment: (NOTE) ?The GeneXpert MRSA Assay (FDA approved for NASAL specimens only), ?is one component of a comprehensive MRSA colonization surveillance ?program. It is not intended to diagnose MRSA infection nor to guide ?or monitor treatment for MRSA infections. ?Test performance is not FDA approved in patients less than 2 years ?old. ?Performed at [facility], [place], ?[name] [address] ?  ?  ? ? ? ? ? ?Radiology Studies: ?DG Chest 2 View ? ?Result Date: 09/23/2021 ?CLINICAL DATA:  Coughing congestion EXAM: CHEST - 2 VIEW COMPARISON:  06/01/2021 FINDINGS: Frontal and lateral views of the chest demonstrate a stable cardiac silhouette. Small bilateral pleural effusions are again noted. Patchy consolidation at the left lung base consistent with atelectasis. No new airspace disease or  pneumothorax. Stable right internal jugular dialysis catheter. No acute bony abnormalities. IMPRESSION: 1. Stable small bilateral pleural effusions. 2. Patchy left basilar atelectasis. 3. No acute airspace disease. Electronicall

## 2021-09-25 NOTE — Evaluation (Signed)
Occupational Therapy Evaluation ?Patient Details ?Name: Katie [name] ?MRN: [id number] ?DOB: [birth year] ?Today's Date: 09/25/2021 ? ? ?History of Present Illness Katie [name] is a 36yoF who comes to [facility] on 09/23/21 c increased redness, swellin gon left arm wound sustained 2 days prior while seperating 2 fighting dogs. PMH: ESRD on HD TTS, DM, HTN, frequet falls, recurrent syncope, orthostatic hypotension. Pt missed her dialysis on 09/23/21.  ? ?Clinical Impression ?  ?Katie [name] presents with generalized weakness, impaired balance, and limited use of L hand that impacts her ability to safely and independently complete functional tasks.  Prior to admission, pt was able to complete all ADLs and household IADLs with modified independence.  She uses 2 SPC or rollator for mobility.  Currently, pt requires intermittent min A for ADLs involving B hands due to limited functional use of L hand.  She presents with pain, edema, limited strength, and limited ROM in L hand.  Pt declined mobility during today's session, but she is able to complete functional mobility with min guard assist and AD per PT report.  Katie [name] will likely continue to benefit from skilled OT services in acute setting to support functional use of L hand in bimanual tasks, as well as balance, safety, and independence in ADLs.  Recommend HHOT upon discharge to further support these goals.  ?   ? ?Recommendations for follow up therapy are one component of a multi-disciplinary discharge planning process, led by the attending physician.  Recommendations may be updated based on patient status, additional functional criteria and insurance authorization.  ? ?Follow Up Recommendations ? Home health OT  ?  ?Assistance Recommended at Discharge Intermittent Supervision/Assistance  ?Patient can return home with the following A little help with walking and/or transfers;A little help with bathing/dressing/bathroom;Assistance with cooking/housework;Assistance with  feeding;Assist for transportation ? ?  ?Functional Status Assessment ? Patient has had a recent decline in their functional status and demonstrates the ability to make significant improvements in function in a reasonable and predictable amount of time.  ?Equipment Recommendations ? None recommended by OT  ?  ?Recommendations for Other Services   ? ? ?  ?Precautions / Restrictions Precautions ?Precautions: Fall ?Restrictions ?Weight Bearing Restrictions: No  ? ?  ? ?Mobility Bed Mobility ?  ?  ?  ?  ?  ?  ?  ?  ?Patient Response: Cooperative ? ?Transfers ?Overall transfer level: Needs assistance ?Equipment used: Straight cane ?Transfers: Sit to/from Stand ?Sit to Stand: Min guard ?  ?  ?  ?  ?  ?General transfer comment: per PT report ?  ? ?  ?Balance Overall balance assessment: History of Falls, Needs assistance ?  ?  ?  ?  ?Standing balance support: Bilateral upper extremity supported, Reliant on assistive device for balance ?  ?  ?  ?  ?  ?  ?  ?  ?  ?  ?  ?  ?  ?   ? ?ADL either performed or assessed with clinical judgement  ? ?ADL Overall ADL's : Needs assistance/impaired ?  ?  ?  ?  ?  ?  ?  ?  ?  ?  ?  ?  ?  ?  ?  ?  ?  ?  ?  ?General ADL Comments: Pt grossly requires standby/min assist for bimanual tasks, as she has limited strength/ROM in L hand.  ? ? ? ?Vision Baseline Vision/History: 1 Wears glasses ?Patient Visual Report: No change from baseline ?   ?   ?  Perception   ?  ?Praxis   ?  ? ?Pertinent Vitals/Pain Pain Assessment ?Pain Assessment: Faces ?Faces Pain Scale: Hurts little more ?Pain Location: left hand wound ?Pain Descriptors / Indicators: Aching, Guarding, Grimacing ?Pain Intervention(s): Limited activity within patient's tolerance, Monitored during session, RN gave pain meds during session  ? ? ? ?Hand Dominance Right (reports writes with R hand, but typically performs some tasks with L hand) ?  ?Extremity/Trunk Assessment Upper Extremity Assessment ?Upper Extremity Assessment: LUE  deficits/detail ?LUE Deficits / Details: PIP flexion 45 degrees, able to perform finger/thumb opposition.  Unable to functionally utilize in bimanual tasks. ?LUE: Unable to fully assess due to pain ?LUE Sensation: WNL ?LUE Coordination: WNL ?  ?Lower Extremity Assessment ?Lower Extremity Assessment: Generalized weakness;Overall [facility] for tasks assessed ?  ?  ?  ?Communication Communication ?Communication: No difficulties ?  ?Cognition Arousal/Alertness: Awake/alert ?Behavior During Therapy: [facility] for tasks assessed/performed ?Overall Cognitive Status: Within Functional Limits for tasks assessed ?  ?  ?  ?  ?  ?  ?  ?  ?  ?  ?  ?  ?  ?  ?  ?  ?  ?  ?  ?General Comments    ? ?  ?Exercises Other Exercises ?Other Exercises: provided education re: OT role and plan of care, compensatory strategies for bimanual tasks ?  ?Shoulder Instructions    ? ? ?Home Living Family/patient expects to be discharged to:: Private residence ?Living Arrangements: Non-relatives/Friends ?Available Help at Discharge: Friend(s);Available PRN/intermittently ?Type of Home: House ?Home Access: Stairs to enter ?Entrance Stairs-Number of Steps: 4 ?Entrance Stairs-Rails: Right ?Home Layout: One level ?  ?  ?Bathroom Shower/Tub: Tub/shower unit;Walk-in shower ?  ?Bathroom Toilet: Standard ?  ?  ?Home Equipment: Rollator (4 wheels);Cane - single point;Grab bars - tub/shower ?  ?  ?  ? ?  ?Prior Functioning/Environment Prior Level of Function : Independent/Modified Independent ?  ?  ?  ?  ?  ?  ?Mobility Comments: uses 2 SPCs for mobility and rollator for longer distances, does not drive ?ADLs Comments: Mod I in basic ADLs, household maintentance, med management.  Reports 1 fall roughly 6 months ago, though frequently loses balance ?  ? ?  ?  ?OT Problem List: Decreased strength;Decreased range of motion;Impaired balance (sitting and/or standing);Decreased knowledge of use of DME or AE;Impaired UE functional use;Pain;Increased edema ?  ?   ?OT  Treatment/Interventions: Self-care/ADL training;Therapeutic exercise;DME and/or AE instruction;Therapeutic activities;Patient/family education;Balance training  ?  ?OT Goals(Current goals can be found in the care plan section) Acute Rehab OT Goals ?Patient Stated Goal: to return home to see pets ?OT Goal Formulation: With patient ?Time For Goal Achievement: 10/09/21 ?Potential to Achieve Goals: Good  ?OT Frequency: Min 2X/week ?  ? ?Co-evaluation   ?  ?  ?  ?  ? ?  ?AM-PAC OT "6 Clicks" Daily Activity     ?Outcome Measure Help from another person eating meals?: A Little ?Help from another person taking care of personal grooming?: A Little ?Help from another person toileting, which includes using toliet, bedpan, or urinal?: None ?Help from another person bathing (including washing, rinsing, drying)?: A Little ?Help from another person to put on and taking off regular upper body clothing?: None ?Help from another person to put on and taking off regular lower body clothing?: A Little ?6 Click Score: 20 ?  ?End of Session   ? ?Activity Tolerance: Patient tolerated treatment well ?Patient left: in bed;with call bell/phone within reach;with  bed alarm set ? ?OT Visit Diagnosis: Muscle weakness (generalized) (M62.81);Pain ?Pain - Right/Left: Left ?Pain - part of body: Hand  ?              ?Time: 3462-1947 ?OT Time Calculation (min): 22 min ?Charges:  OT General Charges ?$OT Visit: 1 Visit ? ?Jeneen Montgomery, OTR/L ?09/25/21, 4:27 PM ? ?

## 2021-09-26 DIAGNOSIS — S61259A Open bite of unspecified finger without damage to nail, initial encounter: Secondary | ICD-10-CM | POA: Diagnosis not present

## 2021-09-26 DIAGNOSIS — W540XXA Bitten by dog, initial encounter: Secondary | ICD-10-CM | POA: Diagnosis not present

## 2021-09-26 DIAGNOSIS — L089 Local infection of the skin and subcutaneous tissue, unspecified: Secondary | ICD-10-CM | POA: Diagnosis not present

## 2021-09-26 DIAGNOSIS — S61452A Open bite of left hand, initial encounter: Secondary | ICD-10-CM | POA: Diagnosis not present

## 2021-09-26 LAB — BASIC METABOLIC PANEL
Anion gap: 12 (ref 5–15)
BUN: 46 mg/dL — ABNORMAL HIGH (ref 8–23)
CO2: 26 mmol/L (ref 22–32)
Calcium: 8.8 mg/dL — ABNORMAL LOW (ref 8.9–10.3)
Chloride: 98 mmol/L (ref 98–111)
Creatinine, Ser: 5.38 mg/dL — ABNORMAL HIGH (ref 0.44–1.00)
GFR, Estimated: 8 mL/min — ABNORMAL LOW (ref >60)
Glucose, Bld: 84 mg/dL (ref 70–99)
Potassium: 4.4 mmol/L (ref 3.5–5.1)
Sodium: 136 mmol/L (ref 135–145)

## 2021-09-26 LAB — CBC
HCT: 37.2 % (ref 36.0–46.0)
Hemoglobin: 11.7 g/dL — ABNORMAL LOW (ref 12.0–15.0)
MCH: 29.8 pg (ref 26.0–34.0)
MCHC: 31.5 g/dL (ref 30.0–36.0)
MCV: 94.9 fL (ref 80.0–100.0)
Platelets: 229 10*3/uL (ref 150–400)
RBC: 3.92 MIL/uL (ref 3.87–5.11)
RDW: 15.9 % — ABNORMAL HIGH (ref 11.5–15.5)
WBC: 6.7 10*3/uL (ref 4.0–10.5)
nRBC: 0 % (ref 0.0–0.2)

## 2021-09-26 LAB — GLUCOSE, CAPILLARY
Glucose-Capillary: 90 mg/dL (ref 70–99)
Glucose-Capillary: 101 mg/dL — ABNORMAL HIGH (ref 70–99)
Glucose-Capillary: 94 mg/dL (ref 70–99)
Glucose-Capillary: 74 mg/dL (ref 70–99)

## 2021-09-26 LAB — HEPATITIS B E ANTIBODY: Hep B E Ab: NEGATIVE

## 2021-09-26 LAB — MAGNESIUM: Magnesium: 2 mg/dL (ref 1.7–2.4)

## 2021-09-26 MED ORDER — ONDANSETRON HCL 4 MG/2ML IJ SOLN
INTRAMUSCULAR | Status: AC
  Filled 2021-09-26: qty 2

## 2021-09-26 MED ORDER — SODIUM CHLORIDE 0.9 % IV SOLN
3.0000 g | Freq: Two times a day (BID) | INTRAVENOUS | Status: DC
  Administered 2021-09-26 – 2021-09-27 (×2): 3 g via INTRAVENOUS
  Filled 2021-09-26 (×2): qty 3
  Filled 2021-09-26: qty 8

## 2021-09-26 MED ORDER — HEPARIN SODIUM (PORCINE) 1000 UNIT/ML IJ SOLN
INTRAMUSCULAR | Status: AC
  Administered 2021-09-26: 4200 [IU]
  Filled 2021-09-26: qty 10

## 2021-09-26 NOTE — Progress Notes (Signed)
?Katie [name]  ?ROUNDING NOTE  ? ?Subjective:  ? ?Patient is a 66 year old female with past medical history of ESRD with hemodialysis TTS, diabetes mellitus, hypertension to the hospital with the left arm swelling and redness due to dog bite. She was also found to have bilateral pleural effusion and layering density in gallbladder upon admission.  ? ?Patient was seen and examined at bedside.  She is awake alert and following commands appropriately.  She stated that there is improvement in pain and swelling on her left hand.  ?Objective:  ?Vital signs in last 24 hours:  ?Temp:  [98 ?F (36.7 ?C)-98.3 ?F (36.8 ?C)] 98.1 ?F (36.7 ?C) (05/16 0932) ?Pulse Rate:  [67-80] 69 (05/16 0742) ?Resp:  [9-20] 16 (05/16 0530) ?BP: (92-153)/(44-77) 142/59 (05/16 3557) ?SpO2:  [97 %-100 %] 98 % (05/16 0742) ?Weight:  [81.3 kg] 81.3 kg (05/15 1206) ? ?Weight change:  ?Filed Weights  ? 09/23/21 1741 09/25/21 0910 09/25/21 1206  ?Weight: 81.6 kg 83.2 kg 81.3 kg  ? ? ?Intake/Output: ?I/O last 3 completed shifts: ?In: 100 [IV Piggyback:100] ?Out: 1374 [[id number]] ?  ?Intake/Output this shift: ? Total I/O ?In: 450 [P.O.:450] ?Out: -  ? ?Physical Exam: ?General: No acute distress  ?Head: Normocephalic, atraumatic. Moist oral mucosal membranes  ?Eyes: Anicteric  ?Neck: Supple  ?Lungs:  Clear to auscultation, normal effort  ?Heart: S1S2 no rubs  ?Abdomen:  Soft, nontender, bowel sounds present  ?Extremities: No lower extremity edema or cyanosis.  Left third finger edema and erythema  ?Neurologic: Awake, alert, following commands  ?Skin: No acute rash  ?Access: R Permacath.  L AVG  ? ? ?Basic Metabolic Panel: ?Recent Labs  ?Lab 09/23/21 ?1743 09/24/21 ?1137 09/25/21 ?0503 09/26/21 ?0446  ?NA 139 136 138 136  ?K 5.3* 4.6 4.7 4.4  ?CL 100 98 101 98  ?CO2 23 26 20* 26  ?GLUCOSE 90 122* 101* 84  ?BUN 64* 69* 77* 46*  ?CREATININE 5.94* 6.38* 7.27* 5.38*  ?CALCIUM 9.0 8.7* 8.8* 8.8*  ?MG  --   --  2.2 2.0  ?PHOS  --  7.4*  --   --    ? ? ? ?Liver Function Tests: ?Recent Labs  ?Lab 09/23/21 ?0623  ?AST 22  ?ALT 25  ?ALKPHOS 111  ?BILITOT 0.6  ?PROT 7.0  ?ALBUMIN 3.5  ? ? ?Recent Labs  ?Lab 09/23/21 ?7628  ?LIPASE 39  ? ? ?No results for input(s): AMMONIA in the last 168 hours. ? ?CBC: ?Recent Labs  ?Lab 09/23/21 ?1743 09/25/21 ?0503 09/26/21 ?0446  ?WBC 7.4 5.9 6.7  ?HGB 12.7 11.2* 11.7*  ?HCT 40.8 36.1 37.2  ?MCV 96.9 97.3 94.9  ?PLT 279 265 229  ? ? ? ?Cardiac Enzymes: ?No results for input(s): CKTOTAL, CKMB, CKMBINDEX, TROPONINI in the last 168 hours. ? ?BNP: ?Invalid input(s): POCBNP ? ?CBG: ?Recent Labs  ?Lab 09/25/21 ?0758 09/25/21 ?1508 09/25/21 ?1704 09/25/21 ?2124 09/26/21 ?0741  ?GLUCAP 104* 87 138* 98 90  ? ? ? ?Microbiology: ?Results for orders placed or performed during the hospital encounter of 09/23/21  ?Blood culture (routine x 2)     Status: None (Preliminary result)  ? Collection Time: 09/23/21  9:43 PM  ? Specimen: BLOOD  ?Result Value Ref Range Status  ? Specimen Description BLOOD BLOOD RIGHT FOREARM  Final  ? Special Requests   Final  ?  BOTTLES DRAWN AEROBIC AND ANAEROBIC Blood Culture results may not be optimal due to an excessive volume of blood received in culture bottles  ?  Culture   Final  ?  NO GROWTH 2 DAYS ?Performed at [facility], [address], [place], [place] [address] ?  ? Report Status PENDING  Incomplete  ?Urine Culture     Status: Abnormal  ? Collection Time: 09/23/21  9:43 PM  ? Specimen: Urine, Random  ?Result Value Ref Range Status  ? Specimen Description   Final  ?  URINE, RANDOM ?Performed at [facility], [address], [place], [place] [address] ?  ? Special Requests   Final  ?  NONE ?Performed at [facility], [address], [place], [place] [address] ?  ? Culture MULTIPLE SPECIES PRESENT, SUGGEST RECOLLECTION (A)  Final  ? Report Status 09/25/2021 FINAL  Final  ?Blood culture (routine x 2)     Status: None (Preliminary result)  ? Collection Time: 09/23/21  9:44 PM   ? Specimen: BLOOD  ?Result Value Ref Range Status  ? Specimen Description BLOOD BLOOD RIGHT HAND  Final  ? Special Requests   Final  ?  BOTTLES DRAWN AEROBIC AND ANAEROBIC Blood Culture results may not be optimal due to an excessive volume of blood received in culture bottles  ? Culture   Final  ?  NO GROWTH 2 DAYS ?Performed at [facility], [address] [place], [place] [address] ?  ? Report Status PENDING  Incomplete  ?MRSA Next Gen by PCR, Nasal     Status: None  ? Collection Time: 09/24/21  6:57 PM  ? Specimen: Nasal Mucosa; Nasal Swab  ?Result Value Ref Range Status  ? MRSA by PCR Next Gen NOT DETECTED NOT DETECTED Final  ?  Comment: (NOTE) ?The GeneXpert MRSA Assay (FDA approved for NASAL specimens only), ?is one component of a comprehensive MRSA colonization surveillance ?program. It is not intended to diagnose MRSA infection nor to guide ?or monitor treatment for MRSA infections. ?Test performance is not FDA approved in patients less than 2 years ?old. ?Performed at [facility] [address], [place], ?[name] [address] ?  ? ? ?Coagulation Studies: ?No results for input(s): LABPROT, INR in the last 72 hours. ? ?Urinalysis: ?Recent Labs  ?  09/23/21 ?2143  ?COLORURINE YELLOW*  ?LABSPEC 1.012  ?PHURINE 9.0*  ?GLUCOSEU 150*  ?HGBUR NEGATIVE  ?BILIRUBINUR NEGATIVE  ?KETONESUR 5*  ?PROTEINUR 100*  ?NITRITE NEGATIVE  ?LEUKOCYTESUR LARGE*  ? ?  ? ? ?Imaging: ?No results found. ? ? ?Medications:  ? ? ceFEPime (MAXIPIME) IV 1 g (09/25/21 2119)  ? ? amLODipine  10 mg Oral QHS  ? arformoterol  15 mcg Nebulization BID  ? calcium acetate  667 mg Oral TID WC  ? carvedilol  12.5 mg Oral QPM  ? Chlorhexidine Gluconate Cloth  6 each Topical Q0600  ? heparin  5,000 Units Subcutaneous Q8H  ? insulin aspart  0-5 Units Subcutaneous QHS  ? insulin aspart  0-6 Units Subcutaneous TID WC  ? metroNIDAZOLE  500 mg Oral Q8H  ? nicotine  21 mg Transdermal Daily  ? revefenacin  175 mcg Nebulization Daily  ?  traZODone  50 mg Oral QPM  ? venlafaxine  37.5 mg Oral BID WC  ? ?acetaminophen **OR** acetaminophen, alum & mag hydroxide-simeth, COVID-19 mRNA bivalent vaccine [employer]), guaiFENesin, hydrALAZINE, hydrocortisone, hydrocortisone cream, ipratropium-albuterol, lip balm, loratadine, metoprolol tartrate, Muscle Rub, ondansetron **OR** ondansetron (ZOFRAN) IV, oxyCODONE-acetaminophen, phenol, pneumococcal 20-valent conjugate vaccine, polyvinyl alcohol, senna-docusate, sodium chloride, tiZANidine, topiramate ? ?Assessment/ Plan:  ?66 y.o. female with past medical history significant for  ESRD on HD TTS, diabetes type 2, hypertension admitted to the hospital due to left arm swelling and redness secondary to dog bite.   Patient was started on antibiotics. She is scheduled to have her dialysis treatment today.  ? ?ESRD on hemodialysis on TTS.  Patient is scheduled to have a hemodialysis treatment again today with UF goal of 1.5 L.  Follow-up renal panel.   ?Hypertension. Continue amlodipine and carvedilol.  Blood pressure noted to be stable. ?Anemia of chronic disease continue to monitor H&H latest hemoglobin is 11.7. ?Secondary hyperparathyroidism.  Last phosphorus level 7.4 continue with binders. Follow-up on phosphorus level. ?Infected dog bite of hand including left finger.  Continue antibiotics treatment per primary care team ? ? LOS: 3 ?[place] ?5/16/202310:07 AM ?  ?

## 2021-09-26 NOTE — Progress Notes (Signed)
Hemodialysis Post Treatment Note: ? ?Tx date:09/26/2021 ?Tx time: 2 hours and 54 minutes ?Access: right CVC ?UF Removed:721ml ? ?Note: ?17:30 HD started ?17:45 BP 112/53 map 70 ?19:15 BP 94/52 map 66. UF goal decreased to 1L from 1.5L and machine temp decrased to 36.5 to help increase bp. Pt asymptomatic. DR lateef notified ?20:27 HD terminated d/t clot on chambers and dialyzer. Blood returned. DR Holley Raring was notified ?20:49 pt complained of nausea. Zofran  4 mg IV  ?given (SEE MAR) ?20:59 PT also complained of headache and Left arm pain at PS of 6. Pt refused medication. Primary nurse wade aware. ? ? ? ? ? ? ? ? ?  ?

## 2021-09-26 NOTE — Progress Notes (Signed)
?PROGRESS NOTE ? ? ? ?Katie [name]  LGX:[id number] DOB: 08-10-1955 DOA: 09/23/2021 ?PCP: Pcp, No  ? ?Brief Narrative:  ?66 year old with history of ESRD on HD TTS, DM 2, HTN came to the hospital with with complaints of left arm swelling and redness after a dog bite 2 days prior to admission.  Per patient dogs are up-to-date with vaccination.  Hand x-ray did not show any bony involvement, chest x-ray showed small bilateral pleural effusion.  She also had some flank pain therefore CT renal study was done which showed trace bilateral pleural effusion, layering density in gallbladder.  She was started on IV vancomycin and cefepime and eventually de-escalated to cefepime only.  She has been tolerating hemodialysis session in the hospital, [facility] consulted for safe disposition planning at home. ? ? ?Assessment & Plan: ? Principal Problem: ?  Infected dog bite of hand including fingers, left, initial encounter ?Active Problems: ?  Hyperkalemia ?  Right flank pain ?  Hypertension ?  Type 2 diabetes mellitus ([place]) ?  ESRD on dialysis [facility]) ?  Depression, unspecified ?  ? ? ?Assessment and Plan: ?* Infected dog bite of hand including fingers, left, initial encounter ?Patient is allergic to penicillin.  Follow culture data. ?Now only on cefepime.  Infection seems to be improving. ?- Pain control.  She is also up-to-date with her tetanus vaccination.  Dogs also up-to-date with vaccination per the patient. ?Routine wound care.  No need for MRI at this time ? ?Acute Bronchitis, COPD ?Tobacco use ?Bronchodilators ordered, IS Flutter. Supportive care.  ? ?Right flank pain with urinary tract infection ?CT does not show any acute findings such as pyelonephritis.  Already on IV cefepime ?Follow culture data ?Right upper quadrant shows slight gallbladder sludge.  LFTs unremarkable. ? ?Hyperkalemia ?Admission potassium 5.3.  Should improve with hemodialysis. ? ?Depression, unspecified ?On home venlafaxine and trazodone ? ?ESRD on dialysis  [facility]) ?Nephrology consulted.  Continue PhosLo.  Plans for HD again today per nephrology ? ?Type 2 diabetes mellitus ([place]) ?Accu-Cheks and sliding scale ? ?Hypertension ?Continue Coreg.  IV hydralazine and Lopressor as needed ? ? ? ? ?DVT prophylaxis: Subcu heparin ?Code Status: Full code ?Family Communication:   ? ?Continue IV antibiotics while she is in the hospital when cleared by nephrology.  In the meantime TOC consulted to help with safe disposition planning at home.  Patient states her home environment is unsafe at this time due to aggressive pets of her roommates.  Anticipate she should be discharged home tomorrow ? ?Subjective: ?Seen and examined at bedside, left upper extremity swelling is improved.  She still has some pain on palpation ?Examination: ?Constitutional: Not in acute distress ?Respiratory: Clear to auscultation bilaterally ?Cardiovascular: Normal sinus rhythm, no rubs ?Abdomen: Nontender nondistended good bowel sounds ?Musculoskeletal: No edema noted ?Skin: Left upper extremity wound noted.  No signs of deeper infection.  Has erythema and tenderness but warmth is significantly improved. ?Neurologic: CN 2-12 grossly intact.  And nonfocal ?Psychiatric: Normal judgment and insight. Alert and oriented x 3. Normal mood. ? ? ? ? ? ? ? ?Objective: ?Vitals:  ? 09/25/21 2008 09/26/21 0530 09/26/21 0622 09/26/21 0742  ?BP: (!) 153/77 (!) 92/44 (!) 120/57 (!) 142/59  ?Pulse: 80 74 67 69  ?Resp: 20 16    ?Temp: 98.1 ?F (36.7 ?C) 98 ?F (36.7 ?C)  98.1 ?F (36.7 ?C)  ?TempSrc:      ?SpO2: 98% 97%  98%  ?Weight:      ?Height:      ? ? ?  Intake/Output Summary (Last 24 hours) at 09/26/2021 0811 ?Last data filed at 09/25/2021 1600 ?Gross per 24 hour  ?Intake 100 ml  ?Output 1374 ml  ?Net -1274 ml  ? ?Filed Weights  ? 09/23/21 1741 09/25/21 0910 09/25/21 1206  ?Weight: 81.6 kg 83.2 kg 81.3 kg  ? ? ? ?Data Reviewed:  ? ?CBC: ?Recent Labs  ?Lab 09/23/21 ?1743 09/25/21 ?0503 09/26/21 ?0446  ?WBC 7.4 5.9 6.7  ?HGB 12.7  11.2* 11.7*  ?HCT 40.8 36.1 37.2  ?MCV 96.9 97.3 94.9  ?PLT 279 265 229  ? ?Basic Metabolic Panel: ?Recent Labs  ?Lab 09/23/21 ?1743 09/24/21 ?1137 09/25/21 ?0503 09/26/21 ?0446  ?NA 139 136 138 136  ?K 5.3* 4.6 4.7 4.4  ?CL 100 98 101 98  ?CO2 23 26 20* 26  ?GLUCOSE 90 122* 101* 84  ?BUN 64* 69* 77* 46*  ?CREATININE 5.94* 6.38* 7.27* 5.38*  ?CALCIUM 9.0 8.7* 8.8* 8.8*  ?MG  --   --  2.2 2.0  ?PHOS  --  7.4*  --   --   ? ?GFR: ?Estimated Creatinine Clearance: 11.5 mL/min (A) (by C-G formula based on SCr of 5.38 mg/dL (H)). ?Liver Function Tests: ?Recent Labs  ?Lab 09/23/21 ?0263  ?AST 22  ?ALT 25  ?ALKPHOS 111  ?BILITOT 0.6  ?PROT 7.0  ?ALBUMIN 3.5  ? ?Recent Labs  ?Lab 09/23/21 ?7858  ?LIPASE 39  ? ?No results for input(s): AMMONIA in the last 168 hours. ?Coagulation Profile: ?No results for input(s): INR, PROTIME in the last 168 hours. ?Cardiac Enzymes: ?No results for input(s): CKTOTAL, CKMB, CKMBINDEX, TROPONINI in the last 168 hours. ?BNP (last 3 results) ?No results for input(s): PROBNP in the last 8760 hours. ?HbA1C: ?Recent Labs  ?  09/23/21 ?8502  ?HGBA1C 4.7*  ? ?CBG: ?Recent Labs  ?Lab 09/25/21 ?0758 09/25/21 ?1508 09/25/21 ?1704 09/25/21 ?2124 09/26/21 ?0741  ?GLUCAP 104* 87 138* 98 90  ? ?Lipid Profile: ?No results for input(s): CHOL, HDL, LDLCALC, TRIG, CHOLHDL, LDLDIRECT in the last 72 hours. ?Thyroid Function Tests: ?No results for input(s): TSH, T4TOTAL, FREET4, T3FREE, THYROIDAB in the last 72 hours. ?Anemia Panel: ?No results for input(s): VITAMINB12, FOLATE, FERRITIN, TIBC, IRON, RETICCTPCT in the last 72 hours. ?Sepsis Labs: ?Recent Labs  ?Lab 09/23/21 ?2143  ?PROCALCITON 0.15  ? ? ?Recent Results (from the past 240 hour(s))  ?Blood culture (routine x 2)     Status: None (Preliminary result)  ? Collection Time: 09/23/21  9:43 PM  ? Specimen: BLOOD  ?Result Value Ref Range Status  ? Specimen Description BLOOD BLOOD RIGHT FOREARM  Final  ? Special Requests   Final  ?  BOTTLES DRAWN AEROBIC AND  ANAEROBIC Blood Culture results may not be optimal due to an excessive volume of blood received in culture bottles  ? Culture   Final  ?  NO GROWTH 2 DAYS ?Performed at [facility], [address], [place], [place] [address] ?  ? Report Status PENDING  Incomplete  ?Urine Culture     Status: Abnormal  ? Collection Time: 09/23/21  9:43 PM  ? Specimen: Urine, Random  ?Result Value Ref Range Status  ? Specimen Description   Final  ?  URINE, RANDOM ?Performed at [facility], [address] [place], [place] [address] ?  ? Special Requests   Final  ?  NONE ?Performed at [facility], [address] [place],  [address] ?  ? Culture MULTIPLE SPECIES PRESENT, SUGGEST RECOLLECTION (A)  Final  ?  Report Status 09/25/2021 FINAL  Final  ?Blood culture (routine x 2)     Status: None (Preliminary result)  ? Collection Time: 09/23/21  9:44 PM  ? Specimen: BLOOD  ?Result Value Ref Range Status  ? Specimen Description BLOOD BLOOD RIGHT HAND  Final  ? Special Requests   Final  ?  BOTTLES DRAWN AEROBIC AND ANAEROBIC Blood Culture results may not be optimal due to an excessive volume of blood received in culture bottles  ? Culture   Final  ?  NO GROWTH 2 DAYS ?Performed at [facility], [address] [place], [place] [address] ?  ? Report Status PENDING  Incomplete  ?MRSA Next Gen by PCR, Nasal     Status: None  ? Collection Time: 09/24/21  6:57 PM  ? Specimen: Nasal Mucosa; Nasal Swab  ?Result Value Ref Range Status  ? MRSA by PCR Next Gen NOT DETECTED NOT DETECTED Final  ?  Comment: (NOTE) ?The GeneXpert MRSA Assay (FDA approved for NASAL specimens only), ?is one component of a comprehensive MRSA colonization surveillance ?program. It is not intended to diagnose MRSA infection nor to guide ?or monitor treatment for MRSA infections. ?Test performance is not FDA approved in patients less than 2 years ?old. ?Performed at [facility], [place], ?[name]  [address] ?  ?  ? ? ? ? ? ?Radiology Studies: ?No results found. ? ? ? ? ? ?Scheduled Meds: ? amLODipine  10 mg Oral QHS  ? arformoterol  15 mcg Nebulization BID  ? calcium acetate  667 mg Oral TID WC  ? carvedilol

## 2021-09-26 NOTE — Progress Notes (Signed)
Physical Therapy Treatment ?Patient Details ?Name: Katie [name] ?MRN: [id number] ?DOB: 05-20-1955 ?Today's Date: 09/26/2021 ? ? ?History of Present Illness Katie [name] is a 25yoF who comes to [facility] on 09/23/21 c increased redness, swellin gon left arm wound sustained 2 days prior while seperating 2 fighting dogs. PMH: ESRD on HD TTS, DM, HTN, frequet falls, recurrent syncope, orthostatic hypotension. Pt missed her dialysis on 09/23/21. ? ?  ?PT Comments  ? ? Pt received in Semi-Fowler's position and agreeable to therapy.  Pt performed bed mobility with needing HHA from therapist to come upright due to not being able to utilize the L UE.  Pt able to come upright and stan, but immediately needed to sit due to dizziness.  Pt performed STS x3, with the final attempt being the only one where she did not note any dizziness.  Pt notes turning her head is difficult due to the wooziness she experiences.  Pt ambulated to the bathroom and assisted with self-care.  Pt then ambulated back to bed noting fatigue and not feeling up for ambulating outside the room.  Current discharge plans to home with no PT remain appropriate at this time, with deficits likely coming from medical issues.  Pt will continue to benefit from skilled therapy in order to address deficits listed below. ?   ?Recommendations for follow up therapy are one component of a multi-disciplinary discharge planning process, led by the attending physician.  Recommendations may be updated based on patient status, additional functional criteria and insurance authorization. ? ?Follow Up Recommendations ? No PT follow up ?  ?  ?Assistance Recommended at Discharge PRN  ?Patient can return home with the following   ?  ?Equipment Recommendations ? None recommended by PT  ?  ?Recommendations for Other Services   ? ? ?  ?Precautions / Restrictions Precautions ?Precautions: Fall ?Restrictions ?Weight Bearing Restrictions: No  ?  ? ?Mobility ? Bed Mobility ?Overal bed mobility:  Needs Assistance ?Bed Mobility: Supine to Sit ?  ?  ?Supine to sit: Min assist ?  ?  ?General bed mobility comments: use of therapists hands for support. ?Patient Response: Cooperative ? ?Transfers ?Overall transfer level: Needs assistance ?Equipment used: Straight cane ?Transfers: Sit to/from Stand ?Sit to Stand: Min assist ?  ?  ?  ?  ?  ?General transfer comment: pt performed STS x3 with dizziness noted on the first two attempts. ?  ? ?Ambulation/Gait ?Ambulation/Gait assistance: Min assist, Min guard ?Gait Distance (Feet): 40 Feet ?Assistive device: Straight cane ?  ?Gait velocity: decreased ?  ?  ?General Gait Details: pt notes increased LOB when turning specifically, noting that she has orthostatic hypotension. ? ? ?Stairs ?  ?  ?  ?  ?  ? ? ?Wheelchair Mobility ?  ? ?Modified Rankin (Stroke Patients Only) ?  ? ? ?  ?Balance Overall balance assessment: History of Falls, Needs assistance ?  ?  ?  ?  ?Standing balance support: Bilateral upper extremity supported, Reliant on assistive device for balance ?  ?  ?  ?  ?  ?  ?  ?  ?  ?  ?  ?  ?  ?  ? ?  ?Cognition Arousal/Alertness: Awake/alert ?Behavior During Therapy: [facility] for tasks assessed/performed ?Overall Cognitive Status: Within Functional Limits for tasks assessed ?  ?  ?  ?  ?  ?  ?  ?  ?  ?  ?  ?  ?  ?  ?  ?  ?  ?  ?  ? ?  ?  Exercises Total Joint Exercises ?Ankle Circles/Pumps: AROM, Strengthening, Both, 10 reps, Seated ?Long Arc Quad: AROM, Strengthening, Both, 10 reps, Seated ? ?  ?General Comments   ?  ?  ? ?Pertinent Vitals/Pain Pain Assessment ?Pain Assessment: Faces ?Pain Score: 8  ?Faces Pain Scale: Hurts little more ?Pain Location: left hand wound ?Pain Descriptors / Indicators: Aching, Guarding, Grimacing ?Pain Intervention(s): Limited activity within patient's tolerance, Monitored during session  ? ? ?Home Living   ?  ?  ?  ?  ?  ?  ?  ?  ?  ?   ?  ?Prior Function    ?  ?  ?   ? ?PT Goals (current goals can now be found in the care plan section)  Acute Rehab PT Goals ?Patient Stated Goal: retun to home, resolve pain, maintain independence ?PT Goal Formulation: With patient ?Time For Goal Achievement: 10/08/21 ?Potential to Achieve Goals: Good ?Progress towards PT goals: Progressing toward goals ? ?  ?Frequency ? ? ? Min 2X/week ? ? ? ?  ?PT Plan Current plan remains appropriate  ? ? ?Co-evaluation   ?  ?  ?  ?  ? ?  ?AM-PAC PT "6 Clicks" Mobility   ?Outcome Measure ? Help needed turning from your back to your side while in a flat bed without using bedrails?: A Little ?Help needed moving from lying on your back to sitting on the side of a flat bed without using bedrails?: A Little ?Help needed moving to and from a bed to a chair (including a wheelchair)?: A Little ?Help needed standing up from a chair using your arms (e.g., wheelchair or bedside chair)?: A Little ?Help needed to walk in hospital room?: A Lot ?Help needed climbing 3-5 steps with a railing? : A Lot ?6 Click Score: 16 ? ?  ?End of Session Equipment Utilized During Treatment: Gait belt ?Activity Tolerance: Patient tolerated treatment well;Patient limited by pain ?Patient left: with nursing/sitter in room;with call bell/phone within reach;in bed ?Nurse Communication: Mobility status ?PT Visit Diagnosis: Difficulty in walking, not elsewhere classified (R26.2);Other abnormalities of gait and mobility (R26.89);Repeated falls (R29.6);History of falling (Z91.81) ?  ? ? ?Time: 3244-0102 ?PT Time Calculation (min) (ACUTE ONLY): 14 min ? ?Charges:  $Therapeutic Activity: 8-22 mins          ?          ? ?Gwenlyn Saran, PT, DPT ?09/26/21, 2:52 PM ? ? ? ?Christie Nottingham ?09/26/2021, 2:50 PM ? ?

## 2021-09-26 NOTE — TOC Initial Note (Signed)
Transition of Care (TOC) - Initial/Assessment Note  ? ? ?Patient Details  ?Name: Katie [name] ?MRN: [id number] ?Date of Birth: [birth year] ? ?Transition of Care (TOC) CM/SW Contact:    ?Beverly Sessions, RN ?Phone Number: ?09/26/2021, 12:47 PM ? ?Clinical Narrative:                 ? ?Admitted for: infected dog bite ?Admitted from: Home with friend  ?PCP: ?Pharmacy:[facility] ?Current home health/prior home health/DME:Rollator (4 wheels);Cane - single point ?Additional Comments: household AMB with 2 SPC, has a rollator for longer distances; uses tranportation for HD  ? ?Utilizes Medicaid transport to HD.  [name] dialysis liaison notified of admission.  Patient is not sure who will pick her up at discharge  ? ?Expected Discharge Plan: Home/Self Care ?Barriers to Discharge: Continued Medical Work up ? ? ?Patient Goals and CMS Choice ?  ?  ?  ? ?Expected Discharge Plan and Services ?Expected Discharge Plan: Home/Self Care ?  ?  ?  ?  ?                ?  ?  ?  ?  ?  ?  ?  ?  ?  ?  ? ?Prior Living Arrangements/Services ?  ?Lives with:: Roommate ?Patient language and need for interpreter reviewed:: Yes ?Do you feel safe going back to the place where you live?: Yes      ?Need for Family Participation in Patient Care: Yes (Comment) ?Care giver support system in place?: Yes (comment) ?Current home services: DME ?Criminal Activity/Legal Involvement Pertinent to Current Situation/Hospitalization: No - Comment as needed ? ?Activities of Daily Living ?Home Assistive Devices/Equipment: Cane (specify quad or straight), Walker (specify type), Other (Comment) (two straight canes, front wheeled walker, rollator) ?ADL Screening (condition at time of admission) ?Patient's cognitive ability adequate to safely complete daily activities?: Yes ?Is the patient deaf or have difficulty hearing?: No ?Does the patient have difficulty seeing, even when wearing glasses/contacts?: No ?Does the patient have difficulty concentrating, remembering,  or making decisions?: No ?Patient able to express need for assistance with ADLs?: Yes ?Does the patient have difficulty dressing or bathing?: No ?Independently performs ADLs?: Yes (appropriate for developmental age) ?Does the patient have difficulty walking or climbing stairs?: Yes ?Weakness of Legs: Both ?Weakness of Arms/Hands: None ? ?Permission Sought/Granted ?  ?  ?   ?   ?   ?   ? ?Emotional Assessment ?  ?  ?  ?Orientation: : Oriented to Self, Oriented to Place, Oriented to  Time, Oriented to Situation ?Alcohol / Substance Use: Not Applicable ?Psych Involvement: No (comment) ? ?Admission diagnosis:  Cellulitis of right forearm [L03.113] ?Left arm cellulitis [I43.329] ?Acute right flank pain [R10.9] ?Dog bite, initial encounter [id number]] ?Acute cough [R05.1] ?Patient Active Problem List  ? Diagnosis Date Noted  ? Infected dog bite of hand including fingers, left, initial encounter 09/23/2021  ? Right flank pain 09/23/2021  ? Iron deficiency anemia, unspecified 03/27/2021  ? Allergy, unspecified, initial encounter 03/22/2021  ? Anaphylactic shock, unspecified, initial encounter 03/22/2021  ? Unspecified jaundice 03/15/2021  ? Allergy status to penicillin 03/14/2021  ? Anemia in chronic kidney disease 03/14/2021  ? Chronic diastolic (congestive) heart failure ([place]) 03/14/2021  ? Dependence on renal dialysis ([place]) 03/14/2021  ? Depression, unspecified 03/14/2021  ? Hypertensive heart and chronic kidney disease with heart failure and with stage 5 chronic kidney disease, or end stage renal disease ([place]) 03/14/2021  ? Personal history of nicotine dependence  03/14/2021  ? Secondary hyperparathyroidism of renal origin ([place]) 03/14/2021  ? Frequent falls 03/07/2021  ? Homelessness 03/07/2021  ? Financial difficulty 03/07/2021  ? ESRD on dialysis [facility]) 12/23/2020  ? Persistent headaches 06/27/2020  ? ATN (acute tubular necrosis) (HCC)   ? Pulmonary hypertension, unspecified ([place])   ? Chronic kidney disease (CKD), stage  IV (severe) (HCC)   ? Pleural effusion   ? Acute heart failure with preserved ejection fraction (HFpEF) ([place])   ? Pressure injury of skin 05/01/2020  ? Hyperkalemia   ? Volume overload 04/30/2020  ? Type 2 diabetes mellitus with stage 5 chronic kidney disease ([place]) 04/30/2020  ? Hypertension   ? 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[place])   ? Type 2 diabetes mellitus ([place])   ? ?PCP:  Pcp, No ?Pharmacy:   ?[id number] [place], [place] [address] ?Phone: [phone] Fax: [phone] ? ?[place] ?[address] [address] ?Phone: [phone] Fax: [phone] ? ?[place] ?[place] ?[place] [address] ?Phone: [phone] Fax: [phone] ? ?[place] ?[place] ?[place] [address] ?Phone: [phone] Fax: [phone] ? ? ? ? ?Social Determinants of Health (SDOH) Interventions ?  ? ?Readmission Risk Interventions ? ?  09/26/2021  ? 12:46 PM 03/08/2021  ?  2:49 PM  ?Readmission Risk Prevention Plan  ?Transportation Screening Complete Complete  ?Medication Review Press photographer) Complete Complete  ?Interior or Home Care Consult  Complete  ?SW Recovery Care/Counseling Consult Complete   ?Palliative Care Screening Not Applicable   ?Skilled Nursing Facility Not Applicable Complete  ? ? ? ?

## 2021-09-27 LAB — GLUCOSE, CAPILLARY
Glucose-Capillary: 96 mg/dL (ref 70–99)
Glucose-Capillary: 80 mg/dL (ref 70–99)

## 2021-09-27 LAB — BASIC METABOLIC PANEL
Anion gap: 10 (ref 5–15)
BUN: 23 mg/dL (ref 8–23)
CO2: 29 mmol/L (ref 22–32)
Calcium: 8.7 mg/dL — ABNORMAL LOW (ref 8.9–10.3)
Chloride: 99 mmol/L (ref 98–111)
Creatinine, Ser: 3.63 mg/dL — ABNORMAL HIGH (ref 0.44–1.00)
GFR, Estimated: 13 mL/min — ABNORMAL LOW (ref >60)
Glucose, Bld: 96 mg/dL (ref 70–99)
Potassium: 4.4 mmol/L (ref 3.5–5.1)
Sodium: 138 mmol/L (ref 135–145)

## 2021-09-27 LAB — CBC
HCT: 37.7 % (ref 36.0–46.0)
Hemoglobin: 11.7 g/dL — ABNORMAL LOW (ref 12.0–15.0)
MCH: 30 pg (ref 26.0–34.0)
MCHC: 31 g/dL (ref 30.0–36.0)
MCV: 96.7 fL (ref 80.0–100.0)
Platelets: 209 10*3/uL (ref 150–400)
RBC: 3.9 MIL/uL (ref 3.87–5.11)
RDW: 16.2 % — ABNORMAL HIGH (ref 11.5–15.5)
WBC: 6.8 10*3/uL (ref 4.0–10.5)
nRBC: 0 % (ref 0.0–0.2)

## 2021-09-27 LAB — MAGNESIUM: Magnesium: 2.1 mg/dL (ref 1.7–2.4)

## 2021-09-27 MED ORDER — AMOXICILLIN-POT CLAVULANATE 250-125 MG PO TABS: 1.0000 | ORAL_TABLET | Freq: Two times a day (BID) | ORAL | 0 refills | Status: AC

## 2021-09-27 MED ORDER — OXYCODONE-ACETAMINOPHEN 5-325 MG PO TABS: 1.0000 | ORAL_TABLET | ORAL | 0 refills | Status: DC | PRN

## 2021-09-27 NOTE — Progress Notes (Signed)
Friend here to pick up the patient. Patient did not want me to review the discharge instructions because she wanted to get down to her friends waiting car. (The friend was upset that she was having to wait). IV removed. Patient sent out with belongings ?

## 2021-09-27 NOTE — Discharge Summary (Signed)
?Physician Discharge Summary ?  ?Patient: Katie [name] MRN: [id number] DOB: 12-29-1955  ?Admit date:     09/23/2021  ?Discharge date: 09/27/21  ?Discharge Physician: Lorella Nimrod  ? ?PCP: Pcp, No  ? ?Recommendations at discharge:  ?Follow-up with primary care provider within a week ?Make sure that she is compliant with her dialysis schedule ? ?Discharge Diagnoses: ?Principal Problem: ?  Infected dog bite of hand including fingers, left, initial encounter ?Active Problems: ?  Hyperkalemia ?  Right flank pain ?  Hypertension ?  Type 2 diabetes mellitus ([place]) ?  ESRD on dialysis [facility]) ?  Depression, unspecified ? ?Hospital Course: ?Brief Narrative:  ?66 year old with history of ESRD on HD TTS, DM 2, HTN came to the hospital with with complaints of left arm swelling and redness after a dog bite 2 days prior to admission.  Per patient dogs are up-to-date with vaccination.  Hand x-ray did not show any bony involvement, chest x-ray showed small bilateral pleural effusion.  She also had some flank pain therefore CT renal study was done which showed trace bilateral pleural effusion, layering density in gallbladder.  She was started on IV vancomycin and cefepime and eventually de-escalated to cefepime only.  ?She was later transitioned to Unasyn which she tolerated well.  She is being discharged home on Augmentin and advised to take it with meals to avoid GI upset.  Only nausea was listed as allergies are penicillin.  History of taking Augmentin prior to this admission. ? ?Patient continued to get her routine dialysis and will continue on discharge. ? ?She had mild hyperkalemia on admission which has resolved with dialysis. ? ?Patient will continue current medications and will follow-up with her providers. ? ? ?Assessment and Plan: ?* Infected dog bite of hand including fingers, left, initial encounter ?Patient allergic to penicillin so will continue cefepime and Vanco from the ED and will add Flagyl for additional anaerobic  coverage ?Pain management ?Dog has been vaccinated against rabies, per patient ?Update tetanus  ?Wound caare ? ?Right flank pain ?CT abdomen and right upper quadrant ultrasound with no acute finding except for gallbladder sludge ?Follow urinalysis to evaluate for UTI ? ?Hyperkalemia ?Mild elevation of potassium to 5.3.  Will give a one-time dose of Veltassa.  Should improve with dialysis ? ?Depression, unspecified ?Continue home venlafaxine and trazodone pending med rec ? ?ESRD on dialysis [facility]) ?Nephrology consulted for continuation of dialysis ? ?Type 2 diabetes mellitus ([place]) ?Sliding scale insulin coverage ? ?Hypertension ?Stable.  Continue carvedilol ? ? ? ?Pain control - [employer] Controlled Substance Reporting System database was reviewed. and patient was instructed, not to drive, operate heavy machinery, perform activities at heights, swimming or participation in water activities or provide baby-sitting services while on Pain, Sleep and Anxiety Medications; until their outpatient Physician has advised to do so again. Also recommended to not to take more than prescribed Pain, Sleep and Anxiety Medications.  ?Consultants: Nephrology ?Procedures performed: HD ?Disposition: Home ?Diet recommendation:  ?Discharge Diet Orders (From admission, onward)  ? ?  Start     Ordered  ? 09/27/21 0000  Diet - low sodium heart healthy       ? 09/27/21 1315  ? ?  ?  ? ?  ? ?Cardiac and Carb modified diet ?DISCHARGE MEDICATION: ?Allergies as of 09/27/2021   ? ?   Reactions  ? Penicillins Nausea And Vomiting  ? Pt states "only pills" make her vomit ?Other reaction(s): Nausea/Vomit  ? ?  ? ?  ?Medication List  ?  ? ?  STOP taking these medications   ? ?aspirin 81 MG EC tablet ?  ?midodrine 5 MG tablet ?Commonly known as: PROAMATINE ?  ?ondansetron 8 MG disintegrating tablet ?Commonly known as: ZOFRAN-ODT ?  ? ?  ? ?TAKE these medications   ? ?acetaminophen 500 MG tablet ?Commonly known as: TYLENOL ?Take 500 mg by mouth every 6  (six) hours as needed for moderate pain. ?  ?amLODipine 10 MG tablet ?Commonly known as: NORVASC ?Take 10 mg by mouth every evening. ?  ?amoxicillin-clavulanate 250-125 MG tablet ?Commonly known as: AUGMENTIN ?Take 1 tablet by mouth 2 (two) times daily for 7 days. ?  ?calcium acetate 667 MG capsule ?Commonly known as: PHOSLO ?Take 1 capsule (667 mg total) by mouth 3 (three) times daily with meals. ?  ?carvedilol 12.5 MG tablet ?Commonly known as: COREG ?Take 1 tablet (12.5 mg total) by mouth every evening. ?  ?lactulose 10 GM/15ML solution ?Commonly known as: [place] ?Take 22.5 mLs (15 g total) by mouth daily. ?  ?lidocaine 5 % ?Commonly known as: LIDODERM ?Place 1 patch onto the skin daily. Remove & Discard patch within 12 hours or as directed by MD ?  ?lidocaine-prilocaine cream ?Commonly known as: EMLA ?Apply topically. ?  ?oxyCODONE-acetaminophen 5-325 MG tablet ?Commonly known as: PERCOCET/ROXICET ?Take 1-2 tablets by mouth every 4 (four) hours as needed for moderate pain or severe pain. ?  ?senna-docusate 8.6-50 MG tablet ?Commonly known as: Senokot-S ?Take 1 tablet by mouth 2 (two) times daily. ?  ?tiZANidine 4 MG tablet ?Commonly known as: ZANAFLEX ?Take 4 mg by mouth 2 (two) times daily as needed. ?  ?topiramate 50 MG tablet ?Commonly known as: TOPAMAX ?Take 1 tablet (50 mg total) by mouth 2 (two) times daily as needed (headache). ?  ?traZODone 50 MG tablet ?Commonly known as: DESYREL ?Take 1 tablet (50 mg total) by mouth every evening. ?  ?venlafaxine 37.5 MG tablet ?Commonly known as: EFFEXOR ?Take 1 tablet (37.5 mg total) by mouth 2 (two) times daily with a meal. ?  ? ?  ? ?  ?  ? ? ?  ?Discharge Care Instructions  ?(From admission, onward)  ?  ? ? ?  ? ?  Start     Ordered  ? 09/27/21 0000  No dressing needed       ? 09/27/21 1315  ? ?  ?  ? ?  ? ? ?Discharge Exam: ?Filed Weights  ? 09/25/21 1206 09/26/21 1715 09/26/21 2059  ?Weight: 81.3 kg 78.3 kg 78.5 kg  ? ?General.     In no acute  distress. ?Pulmonary.  Lungs clear bilaterally, normal respiratory effort. ?CV.  Regular rate and rhythm, no JVD, rub or murmur. ?Abdomen.  Soft, nontender, nondistended, BS positive. ?CNS.  Alert and oriented .  No focal neurologic deficit. ?Extremities.  No lower extremity edema, no cyanosis, pulses intact and symmetrical. ?Left hand with a healing dog bite with some surrounding edema ?Psychiatry.  Judgment and insight appears normal.  ? ?Condition at discharge: good ? ?The results of significant diagnostics from this hospitalization (including imaging, microbiology, ancillary and laboratory) are listed below for reference.  ? ?Imaging Studies: ?DG Chest 2 View ? ?Result Date: 09/23/2021 ?CLINICAL DATA:  Coughing congestion EXAM: CHEST - 2 VIEW COMPARISON:  06/01/2021 FINDINGS: Frontal and lateral views of the chest demonstrate a stable cardiac silhouette. Small bilateral pleural effusions are again noted. Patchy consolidation at the left lung base consistent with atelectasis. No new airspace disease or pneumothorax. Stable right internal  jugular dialysis catheter. No acute bony abnormalities. IMPRESSION: 1. Stable small bilateral pleural effusions. 2. Patchy left basilar atelectasis. 3. No acute airspace disease. Electronically Signed   By: Randa Ngo M.D.   On: 09/23/2021 20:45  ? ?DG Hand Complete Left ? ?Result Date: 09/23/2021 ?CLINICAL DATA:  Left hand swelling, pain EXAM: LEFT HAND - COMPLETE 3+ VIEW COMPARISON:  None Available. FINDINGS: Degenerative changes in the wrist. No acute bony abnormality. Specifically, no fracture, subluxation, or dislocation. IMPRESSION: No acute bony abnormality. Electronically Signed   By: Rolm Baptise M.D.   On: 09/23/2021 22:35  ? ?CT Renal Stone Study ? ?Result Date: 09/23/2021 ?CLINICAL DATA:  Flank pain, kidney stone suspected. Right flank pain EXAM: CT ABDOMEN AND PELVIS WITHOUT CONTRAST TECHNIQUE: Multidetector CT imaging of the abdomen and pelvis was performed  following the standard protocol without IV contrast. RADIATION DOSE REDUCTION: This exam was performed according to the departmental dose-optimization program which includes automated exposure control, adjustment of the mA

## 2021-09-27 NOTE — TOC Progression Note (Addendum)
Transition of Care (TOC) - Progression Note  ? ? ?Patient Details  ?Name: Lakeesha Fontanilla ?MRN: [id number] ?Date of Birth: 04/12/1956 ? ?Transition of Care (TOC) CM/SW Contact  ?Beverly Sessions, RN ?Phone Number: ?09/27/2021, 12:28 PM ? ?Clinical Narrative:    ? ? ?Patient confirms she will be returning to her friend Lee's home, and [facility] will be providing transport at discharge ? ?Patient states that she is on the waiting list with medicaid for a new PCP ? ?[name] dialysis liaison notified of discharge ? ?Expected Discharge Plan: Home/Self Care ?Barriers to Discharge: Continued Medical Work up ? ?Expected Discharge Plan and Services ?Expected Discharge Plan: Home/Self Care ?  ?  ?  ?  ?                ?  ?  ?  ?  ?  ?  ?  ?  ?  ?  ? ? ?Social Determinants of Health (SDOH) Interventions ?  ? ?Readmission Risk Interventions ? ?  09/26/2021  ? 12:46 PM 03/08/2021  ?  2:49 PM  ?Readmission Risk Prevention Plan  ?Transportation Screening Complete Complete  ?Medication Review Press photographer) Complete Complete  ?[place] or Home Care Consult  Complete  ?SW Recovery Care/Counseling Consult Complete   ?Palliative Care Screening Not Applicable   ?Skilled Nursing Facility Not Applicable Complete  ? ? ?

## 2021-09-27 NOTE — Progress Notes (Signed)
?Sartell Kidney  ?ROUNDING NOTE  ? ?Subjective:  ? ?Patient is a 66 year old female with past medical history of ESRD with hemodialysis TTS, diabetes mellitus, hypertension to the hospital with the left arm swelling and redness due to dog bite. She was also found to have bilateral pleural effusion and layering density in gallbladder upon admission.  ? ?Update: ?Patient seen resting in bed ?Complains of left hand pain, reports hitting it on side rail. ?Improved pain management and swelling ? ?Objective:  ?Vital signs in last 24 hours:  ?Temp:  [98.1 ?F (36.7 ?C)-98.6 ?F (37 ?C)] 98.1 ?F (36.7 ?C) (05/17 0740) ?Pulse Rate:  [65-82] 76 (05/17 0740) ?Resp:  [11-26] 18 (05/17 0740) ?BP: (94-151)/(50-69) 108/61 (05/17 0740) ?SpO2:  [90 %-98 %] 92 % (05/17 0740) ?Weight:  [78.3 kg-78.5 kg] 78.5 kg (05/16 2059) ? ?Weight change: -4.9 kg ?Filed Weights  ? 09/25/21 1206 09/26/21 1715 09/26/21 2059  ?Weight: 81.3 kg 78.3 kg 78.5 kg  ? ? ?Intake/Output: ?I/O last 3 completed shifts: ?In: 790.1 [P.O.:690; IV Piggyback:100.1] ?Out: 762 [Other:762] ?  ?Intake/Output this shift: ? Total I/O ?In: 360 [P.O.:360] ?Out: -  ? ?Physical Exam: ?General: No acute distress  ?Head: Normocephalic, atraumatic. Moist oral mucosal membranes  ?Eyes: Anicteric  ?Neck: Supple  ?Lungs:  Clear to auscultation, normal effort  ?Heart: S1S2 no rubs  ?Abdomen:  Soft, nontender, bowel sounds present  ?Extremities: No lower extremity edema or cyanosis.  Left third finger edema and erythema  ?Neurologic: Awake, alert, following commands  ?Skin: No acute rash  ?Access: R Permacath.  L AVG  ? ? ?Basic Metabolic Panel: ?Recent Labs  ?Lab 09/23/21 ?1743 09/24/21 ?1137 09/25/21 ?0503 09/26/21 ?0093 09/27/21 ?0405  ?NA 139 136 138 136 138  ?K 5.3* 4.6 4.7 4.4 4.4  ?CL 100 98 101 98 99  ?CO2 23 26 20* 26 29  ?GLUCOSE 90 122* 101* 84 96  ?BUN 64* 69* 77* 46* 23  ?CREATININE 5.94* 6.38* 7.27* 5.38* 3.63*  ?CALCIUM 9.0 8.7* 8.8* 8.8* 8.7*  ?MG  --   --  2.2  2.0 2.1  ?PHOS  --  7.4*  --   --   --   ? ? ? ?Liver Function Tests: ?Recent Labs  ?Lab 09/23/21 ?8182  ?AST 22  ?ALT 25  ?ALKPHOS 111  ?BILITOT 0.6  ?PROT 7.0  ?ALBUMIN 3.5  ? ? ?Recent Labs  ?Lab 09/23/21 ?9937  ?LIPASE 39  ? ? ?No results for input(s): AMMONIA in the last 168 hours. ? ?CBC: ?Recent Labs  ?Lab 09/23/21 ?1743 09/25/21 ?0503 09/26/21 ?1696 09/27/21 ?0405  ?WBC 7.4 5.9 6.7 6.8  ?HGB 12.7 11.2* 11.7* 11.7*  ?HCT 40.8 36.1 37.2 37.7  ?MCV 96.9 97.3 94.9 96.7  ?PLT 279 265 229 209  ? ? ? ?Cardiac Enzymes: ?No results for input(s): CKTOTAL, CKMB, CKMBINDEX, TROPONINI in the last 168 hours. ? ?BNP: ?Invalid input(s): POCBNP ? ?CBG: ?Recent Labs  ?Lab 09/26/21 ?1129 09/26/21 ?1633 09/26/21 ?2140 09/27/21 ?7893 09/27/21 ?1142  ?GLUCAP 101* 94 74 96 80  ? ? ? ?Microbiology: ?Results for orders placed or performed during the hospital encounter of 09/23/21  ?Blood culture (routine x 2)     Status: None (Preliminary result)  ? Collection Time: 09/23/21  9:43 PM  ? Specimen: BLOOD  ?Result Value Ref Range Status  ? Specimen Description BLOOD BLOOD RIGHT FOREARM  Final  ? Special Requests   Final  ?  BOTTLES DRAWN AEROBIC AND ANAEROBIC Blood Culture results may  not be optimal due to an excessive volume of blood received in culture bottles  ? Culture   Final  ?  NO GROWTH 4 DAYS ?Performed at [facility], [address], [place], [place] [address] ?  ? Report Status PENDING  Incomplete  ?Urine Culture     Status: Abnormal  ? Collection Time: 09/23/21  9:43 PM  ? Specimen: Urine, Random  ?Result Value Ref Range Status  ? Specimen Description   Final  ?  URINE, RANDOM ?Performed at [facility], [address], [place], [place] [address] ?  ? Special Requests   Final  ?  NONE ?Performed at [facility], [address], [place], [place] [address] ?  ? Culture MULTIPLE SPECIES PRESENT, SUGGEST RECOLLECTION (A)  Final  ? Report Status 09/25/2021 FINAL  Final  ?Blood culture (routine x 2)      Status: None (Preliminary result)  ? Collection Time: 09/23/21  9:44 PM  ? Specimen: BLOOD  ?Result Value Ref Range Status  ? Specimen Description BLOOD BLOOD RIGHT HAND  Final  ? Special Requests   Final  ?  BOTTLES DRAWN AEROBIC AND ANAEROBIC Blood Culture results may not be optimal due to an excessive volume of blood received in culture bottles  ? Culture   Final  ?  NO GROWTH 4 DAYS ?Performed at [facility], [address], [place], [place] [address] ?  ? Report Status PENDING  Incomplete  ?MRSA Next Gen by PCR, Nasal     Status: None  ? Collection Time: 09/24/21  6:57 PM  ? Specimen: Nasal Mucosa; Nasal Swab  ?Result Value Ref Range Status  ? MRSA by PCR Next Gen NOT DETECTED NOT DETECTED Final  ?  Comment: (NOTE) ?The GeneXpert MRSA Assay (FDA approved for NASAL specimens only), ?is one component of a comprehensive MRSA colonization surveillance ?program. It is not intended to diagnose MRSA infection nor to guide ?or monitor treatment for MRSA infections. ?Test performance is not FDA approved in patients less than 2 years ?old. ?Performed at [facility], [place], ?[name] [address] ?  ? ? ?Coagulation Studies: ?No results for input(s): LABPROT, INR in the last 72 hours. ? ?Urinalysis: ?No results for input(s): COLORURINE, LABSPEC, [place], GLUCOSEU, HGBUR, BILIRUBINUR, KETONESUR, PROTEINUR, UROBILINOGEN, NITRITE, LEUKOCYTESUR in the last 72 hours. ? ?Invalid input(s): APPERANCEUR ?  ? ? ?Imaging: ?No results found. ? ? ?Medications:  ? ? ampicillin-sulbactam (UNASYN) IV 3 g (09/27/21 0901)  ? ? amLODipine  10 mg Oral QHS  ? arformoterol  15 mcg Nebulization BID  ? calcium acetate  667 mg Oral TID WC  ? carvedilol  12.5 mg Oral QPM  ? Chlorhexidine Gluconate Cloth  6 each Topical Q0600  ? heparin  5,000 Units Subcutaneous Q8H  ? insulin aspart  0-5 Units Subcutaneous QHS  ? insulin aspart  0-6 Units Subcutaneous TID WC  ? revefenacin  175 mcg Nebulization Daily  ?  traZODone  50 mg Oral QPM  ? venlafaxine  37.5 mg Oral BID WC  ? ?acetaminophen **OR** acetaminophen, alum & mag hydroxide-simeth, COVID-19 mRNA bivalent vaccine [employer]), guaiFENesin, hydrALAZINE, hydrocortisone, hydrocortisone cream, ipratropium-albuterol, lip balm, loratadine, metoprolol tartrate, Muscle Rub, ondansetron **OR** ondansetron (ZOFRAN) IV, oxyCODONE-acetaminophen, phenol, pneumococcal 20-valent conjugate vaccine, polyvinyl alcohol, senna-docusate, sodium chloride, tiZANidine, topiramate ? ?Assessment/ Plan:  ?66 y.o. female with past medical history significant for ESRD on HD TTS, diabetes type 2, hypertension admitted to the hospital due to left  arm swelling and redness secondary to dog bite.   Patient was started on antibiotics. She is scheduled to have her dialysis treatment today.  ? ?ESRD on hemodialysis on TTS.  ? Received dialysis yesterday, tolerated well. Next treatment scheduled for Thursday. ? ?Hypertension. Continue amlodipine and carvedilol.  Blood pressure 108/61 ? ?Anemia of chronic disease continue to monitor H&H latest hemoglobin remains 11.7. ? ?Secondary hyperparathyroidism.  Will continue to monitor bone minerals during this admission. Continue with binders.  ? ?Infected dog bite of hand including left finger. Will transition to oral antibiotics for discharge.  ? ? LOS: 4 ?[place] ?5/17/202312:33 PM ?  ?

## 2021-09-27 NOTE — Progress Notes (Signed)
OT Cancellation Note ? ?Patient Details ?Name: Katie [name] ?MRN: [id number] ?DOB: 26-Jul-1955 ? ? ?Cancelled Treatment:    Reason Eval/Treat Not Completed: Other (comment) (Pt says she's discharging and declines any therapy) ? ?Leta Speller, MS, OTR/L ? ?Darleene Cleaver ?09/27/2021, 1:16 PM ?

## 2021-09-28 LAB — CULTURE, BLOOD (ROUTINE X 2)
Culture: NO GROWTH
Culture: NO GROWTH

## 2021-09-30 ENCOUNTER — Emergency Department: Payer: Medicaid Other

## 2021-09-30 ENCOUNTER — Other Ambulatory Visit: Payer: Self-pay

## 2021-09-30 ENCOUNTER — Inpatient Hospital Stay
Admission: EM | Admit: 2021-09-30 | Discharge: 2021-11-27 | DRG: 871 | Disposition: A | Discharge status: Skilled Nursing Facility | Payer: Medicaid Other | Attending: General Practice | Admitting: General Practice

## 2021-09-30 ENCOUNTER — Encounter: Payer: Self-pay | Admitting: Emergency Medicine

## 2021-09-30 DIAGNOSIS — N186 End stage renal disease: Secondary | ICD-10-CM | POA: Diagnosis not present

## 2021-09-30 DIAGNOSIS — E8770 Fluid overload, unspecified: Secondary | ICD-10-CM

## 2021-09-30 DIAGNOSIS — Z801 Family history of malignant neoplasm of trachea, bronchus and lung: Secondary | ICD-10-CM

## 2021-09-30 DIAGNOSIS — D631 Anemia in chronic kidney disease: Secondary | ICD-10-CM

## 2021-09-30 DIAGNOSIS — Z8049 Family history of malignant neoplasm of other genital organs: Secondary | ICD-10-CM

## 2021-09-30 DIAGNOSIS — M549 Dorsalgia, unspecified: Secondary | ICD-10-CM | POA: Diagnosis not present

## 2021-09-30 DIAGNOSIS — F4323 Adjustment disorder with mixed anxiety and depressed mood: Secondary | ICD-10-CM

## 2021-09-30 DIAGNOSIS — M65812 Other synovitis and tenosynovitis, left shoulder: Secondary | ICD-10-CM | POA: Diagnosis present

## 2021-09-30 DIAGNOSIS — N189 Chronic kidney disease, unspecified: Secondary | ICD-10-CM | POA: Diagnosis present

## 2021-09-30 DIAGNOSIS — N2581 Secondary hyperparathyroidism of renal origin: Secondary | ICD-10-CM

## 2021-09-30 DIAGNOSIS — Z8249 Family history of ischemic heart disease and other diseases of the circulatory system: Secondary | ICD-10-CM

## 2021-09-30 DIAGNOSIS — A419 Sepsis, unspecified organism: Principal | ICD-10-CM | POA: Diagnosis present

## 2021-09-30 DIAGNOSIS — Z825 Family history of asthma and other chronic lower respiratory diseases: Secondary | ICD-10-CM

## 2021-09-30 DIAGNOSIS — J45909 Unspecified asthma, uncomplicated: Secondary | ICD-10-CM | POA: Diagnosis present

## 2021-09-30 DIAGNOSIS — R112 Nausea with vomiting, unspecified: Secondary | ICD-10-CM | POA: Diagnosis not present

## 2021-09-30 DIAGNOSIS — Z6830 Body mass index (BMI) 30.0-30.9, adult: Secondary | ICD-10-CM

## 2021-09-30 DIAGNOSIS — L0390 Cellulitis, unspecified: Secondary | ICD-10-CM | POA: Diagnosis present

## 2021-09-30 DIAGNOSIS — E876 Hypokalemia: Secondary | ICD-10-CM | POA: Diagnosis not present

## 2021-09-30 DIAGNOSIS — Z8042 Family history of malignant neoplasm of prostate: Secondary | ICD-10-CM

## 2021-09-30 DIAGNOSIS — Z992 Dependence on renal dialysis: Secondary | ICD-10-CM | POA: Diagnosis present

## 2021-09-30 DIAGNOSIS — Z87891 Personal history of nicotine dependence: Secondary | ICD-10-CM

## 2021-09-30 DIAGNOSIS — Z599 Problem related to housing and economic circumstances, unspecified: Secondary | ICD-10-CM

## 2021-09-30 DIAGNOSIS — E119 Type 2 diabetes mellitus without complications: Secondary | ICD-10-CM

## 2021-09-30 DIAGNOSIS — I132 Hypertensive heart and chronic kidney disease with heart failure and with stage 5 chronic kidney disease, or end stage renal disease: Secondary | ICD-10-CM

## 2021-09-30 DIAGNOSIS — N170 Acute kidney failure with tubular necrosis: Secondary | ICD-10-CM

## 2021-09-30 DIAGNOSIS — D509 Iron deficiency anemia, unspecified: Secondary | ICD-10-CM

## 2021-09-30 DIAGNOSIS — R531 Weakness: Secondary | ICD-10-CM | POA: Diagnosis present

## 2021-09-30 DIAGNOSIS — S52282A Bent bone of left ulna, initial encounter for closed fracture: Secondary | ICD-10-CM | POA: Diagnosis present

## 2021-09-30 DIAGNOSIS — K5641 Fecal impaction: Secondary | ICD-10-CM

## 2021-09-30 DIAGNOSIS — S61452A Open bite of left hand, initial encounter: Secondary | ICD-10-CM | POA: Diagnosis present

## 2021-09-30 DIAGNOSIS — I272 Pulmonary hypertension, unspecified: Secondary | ICD-10-CM

## 2021-09-30 DIAGNOSIS — I350 Nonrheumatic aortic (valve) stenosis: Secondary | ICD-10-CM

## 2021-09-30 DIAGNOSIS — L8990 Pressure ulcer of unspecified site, unspecified stage: Secondary | ICD-10-CM

## 2021-09-30 DIAGNOSIS — I5031 Acute diastolic (congestive) heart failure: Secondary | ICD-10-CM

## 2021-09-30 DIAGNOSIS — R17 Unspecified jaundice: Secondary | ICD-10-CM

## 2021-09-30 DIAGNOSIS — E1122 Type 2 diabetes mellitus with diabetic chronic kidney disease: Secondary | ICD-10-CM | POA: Diagnosis present

## 2021-09-30 DIAGNOSIS — Z5900 Homelessness unspecified: Secondary | ICD-10-CM | POA: Diagnosis not present

## 2021-09-30 DIAGNOSIS — R109 Unspecified abdominal pain: Secondary | ICD-10-CM

## 2021-09-30 DIAGNOSIS — N184 Chronic kidney disease, stage 4 (severe): Secondary | ICD-10-CM

## 2021-09-30 DIAGNOSIS — I953 Hypotension of hemodialysis: Secondary | ICD-10-CM | POA: Diagnosis not present

## 2021-09-30 DIAGNOSIS — E1151 Type 2 diabetes mellitus with diabetic peripheral angiopathy without gangrene: Secondary | ICD-10-CM | POA: Diagnosis present

## 2021-09-30 DIAGNOSIS — L03114 Cellulitis of left upper limb: Secondary | ICD-10-CM

## 2021-09-30 DIAGNOSIS — Z5986 Financial insecurity: Secondary | ICD-10-CM

## 2021-09-30 DIAGNOSIS — T7840XA Allergy, unspecified, initial encounter: Secondary | ICD-10-CM

## 2021-09-30 DIAGNOSIS — E669 Obesity, unspecified: Secondary | ICD-10-CM | POA: Diagnosis present

## 2021-09-30 DIAGNOSIS — Z820 Family history of epilepsy and other diseases of the nervous system: Secondary | ICD-10-CM

## 2021-09-30 DIAGNOSIS — W540XXA Bitten by dog, initial encounter: Secondary | ICD-10-CM

## 2021-09-30 DIAGNOSIS — J189 Pneumonia, unspecified organism: Secondary | ICD-10-CM | POA: Diagnosis present

## 2021-09-30 DIAGNOSIS — R6520 Severe sepsis without septic shock: Secondary | ICD-10-CM

## 2021-09-30 DIAGNOSIS — F32A Depression, unspecified: Secondary | ICD-10-CM

## 2021-09-30 DIAGNOSIS — R296 Repeated falls: Secondary | ICD-10-CM

## 2021-09-30 DIAGNOSIS — I120 Hypertensive chronic kidney disease with stage 5 chronic kidney disease or end stage renal disease: Secondary | ICD-10-CM | POA: Diagnosis present

## 2021-09-30 DIAGNOSIS — Z91138 Patient's unintentional underdosing of medication regimen for other reason: Secondary | ICD-10-CM

## 2021-09-30 DIAGNOSIS — L089 Local infection of the skin and subcutaneous tissue, unspecified: Secondary | ICD-10-CM | POA: Diagnosis present

## 2021-09-30 DIAGNOSIS — I10 Essential (primary) hypertension: Secondary | ICD-10-CM | POA: Diagnosis present

## 2021-09-30 DIAGNOSIS — Z95828 Presence of other vascular implants and grafts: Secondary | ICD-10-CM

## 2021-09-30 DIAGNOSIS — T782XXA Anaphylactic shock, unspecified, initial encounter: Secondary | ICD-10-CM

## 2021-09-30 DIAGNOSIS — Z79899 Other long term (current) drug therapy: Secondary | ICD-10-CM

## 2021-09-30 DIAGNOSIS — T3696XA Underdosing of unspecified systemic antibiotic, initial encounter: Secondary | ICD-10-CM | POA: Diagnosis present

## 2021-09-30 DIAGNOSIS — Z880 Allergy status to penicillin: Secondary | ICD-10-CM

## 2021-09-30 DIAGNOSIS — I5032 Chronic diastolic (congestive) heart failure: Secondary | ICD-10-CM

## 2021-09-30 DIAGNOSIS — R519 Headache, unspecified: Secondary | ICD-10-CM

## 2021-09-30 DIAGNOSIS — J90 Pleural effusion, not elsewhere classified: Secondary | ICD-10-CM

## 2021-09-30 DIAGNOSIS — J690 Pneumonitis due to inhalation of food and vomit: Secondary | ICD-10-CM | POA: Diagnosis present

## 2021-09-30 DIAGNOSIS — S52283A Bent bone of unspecified ulna, initial encounter for closed fracture: Secondary | ICD-10-CM | POA: Diagnosis present

## 2021-09-30 DIAGNOSIS — Z808 Family history of malignant neoplasm of other organs or systems: Secondary | ICD-10-CM

## 2021-09-30 DIAGNOSIS — Z1152 Encounter for screening for COVID-19: Secondary | ICD-10-CM

## 2021-09-30 DIAGNOSIS — E875 Hyperkalemia: Secondary | ICD-10-CM

## 2021-09-30 DIAGNOSIS — Y95 Nosocomial condition: Secondary | ICD-10-CM | POA: Diagnosis present

## 2021-09-30 DIAGNOSIS — Z751 Person awaiting admission to adequate facility elsewhere: Secondary | ICD-10-CM

## 2021-09-30 LAB — CBC
HCT: 37.4 % (ref 36.0–46.0)
Hemoglobin: 11.5 g/dL — ABNORMAL LOW (ref 12.0–15.0)
MCH: 30 pg (ref 26.0–34.0)
MCHC: 30.7 g/dL (ref 30.0–36.0)
MCV: 97.7 fL (ref 80.0–100.0)
Platelets: 206 10*3/uL (ref 150–400)
RBC: 3.83 MIL/uL — ABNORMAL LOW (ref 3.87–5.11)
RDW: 16.3 % — ABNORMAL HIGH (ref 11.5–15.5)
WBC: 7.2 10*3/uL (ref 4.0–10.5)
nRBC: 0 % (ref 0.0–0.2)

## 2021-09-30 LAB — BASIC METABOLIC PANEL
Anion gap: 13 (ref 5–15)
BUN: 16 mg/dL (ref 8–23)
CO2: 30 mmol/L (ref 22–32)
Calcium: 8.4 mg/dL — ABNORMAL LOW (ref 8.9–10.3)
Chloride: 95 mmol/L — ABNORMAL LOW (ref 98–111)
Creatinine, Ser: 2.86 mg/dL — ABNORMAL HIGH (ref 0.44–1.00)
GFR, Estimated: 18 mL/min — ABNORMAL LOW (ref >60)
Glucose, Bld: 98 mg/dL (ref 70–99)
Potassium: 3.4 mmol/L — ABNORMAL LOW (ref 3.5–5.1)
Sodium: 138 mmol/L (ref 135–145)

## 2021-09-30 MED ORDER — ACETAMINOPHEN 500 MG PO TABS
500.0000 mg | ORAL_TABLET | Freq: Four times a day (QID) | ORAL | Status: DC | PRN
  Administered 2021-10-04 – 2021-10-23 (×4): 500 mg via ORAL
  Filled 2021-09-30 (×4): qty 1

## 2021-09-30 MED ORDER — OXYCODONE HCL 5 MG PO TABS
5.0000 mg | ORAL_TABLET | Freq: Once | ORAL | Status: AC
  Administered 2021-09-30: 5 mg via ORAL
  Filled 2021-09-30: qty 1

## 2021-09-30 MED ORDER — AMOXICILLIN-POT CLAVULANATE 875-125 MG PO TABS
1.0000 | ORAL_TABLET | Freq: Two times a day (BID) | ORAL | Status: DC
  Administered 2021-09-30 – 2021-10-02 (×4): 1 via ORAL
  Filled 2021-09-30 (×4): qty 1

## 2021-09-30 MED ORDER — DIPHENHYDRAMINE HCL 25 MG PO CAPS
25.0000 mg | ORAL_CAPSULE | Freq: Once | ORAL | Status: AC
Start: 2021-09-30 — End: 2021-09-30
  Administered 2021-09-30: 25 mg via ORAL
  Filled 2021-09-30: qty 1

## 2021-09-30 MED ORDER — CARVEDILOL 6.25 MG PO TABS
12.5000 mg | ORAL_TABLET | Freq: Every evening | ORAL | Status: DC
  Administered 2021-09-30 – 2021-11-25 (×54): 12.5 mg via ORAL
  Filled 2021-09-30 (×56): qty 2

## 2021-09-30 MED ORDER — CALCIUM ACETATE (PHOS BINDER) 667 MG PO CAPS
667.0000 mg | ORAL_CAPSULE | Freq: Three times a day (TID) | ORAL | Status: DC
  Administered 2021-10-01 – 2021-11-03 (×84): 667 mg via ORAL
  Filled 2021-09-30 (×95): qty 1

## 2021-09-30 MED ORDER — TIZANIDINE HCL 4 MG PO TABS
4.0000 mg | ORAL_TABLET | Freq: Two times a day (BID) | ORAL | Status: DC | PRN
  Administered 2021-10-15 – 2021-11-17 (×11): 4 mg via ORAL
  Filled 2021-09-30 (×4): qty 1
  Filled 2021-09-30 (×2): qty 2
  Filled 2021-09-30 (×7): qty 1

## 2021-09-30 MED ORDER — SODIUM CHLORIDE 0.9 % IV SOLN
3.0000 g | Freq: Once | INTRAVENOUS | Status: AC
  Administered 2021-09-30: 3 g via INTRAVENOUS
  Filled 2021-09-30: qty 8

## 2021-09-30 MED ORDER — AMLODIPINE BESYLATE 10 MG PO TABS
10.0000 mg | ORAL_TABLET | Freq: Every evening | ORAL | Status: DC
  Administered 2021-09-30 – 2021-10-27 (×25): 10 mg via ORAL
  Filled 2021-09-30 (×2): qty 2
  Filled 2021-09-30: qty 1
  Filled 2021-09-30 (×4): qty 2
  Filled 2021-09-30: qty 1
  Filled 2021-09-30 (×3): qty 2
  Filled 2021-09-30: qty 1
  Filled 2021-09-30 (×13): qty 2
  Filled 2021-09-30: qty 1
  Filled 2021-09-30: qty 2

## 2021-09-30 MED ORDER — OXYCODONE HCL 5 MG PO TABS
5.0000 mg | ORAL_TABLET | Freq: Four times a day (QID) | ORAL | Status: AC | PRN
  Administered 2021-10-01 – 2021-10-03 (×8): 5 mg via ORAL
  Filled 2021-09-30 (×8): qty 1

## 2021-09-30 NOTE — Consult Note (Signed)
Initial Consultation Note   Patient: Katie [name] BMW:[id number] DOB: Apr 15, 1956 PCP: Pcp, No DOA: 09/30/2021 DOS: the patient was seen and examined on 09/30/2021 Primary service: Vanessa [place], MD  Referring physician: Dr Nickolas [place] Reason for consult: Patient with left hand cellulitis and nowhere to go.  Assessment/Plan: Assessment and Plan: Left hand cellulitis.  Secondary to dog bite last week.  She was treated initially with IV antibiotics and discharged on Augmentin which she never took. Patient with no systemic sign of infection and clinically improving left hand cellulitis. No need for admission. -She should continue Augmentin -TOC consult  ESRD.  Patient had her dialysis today and apparently her friend refused to take her back home. -TOC to help with placement and transportation for dialysis  TRH will sign off at present, please call us again when needed.  HPI: Katie [name] is a 66 y.o. female with past medical history of ESRD on HD TTS, DM 2, HTN came to the hospital with with complaints of left arm swelling and redness after a dog bite.  She was recently admitted for the same reason from 5/13-5/17/2023. Initially received IV vancomycin and cefepime followed by cefepime only and then switched to Unasyn.  Patient was discharged on few more days of Augmentin which she never picked up from pharmacy stating that she does not have any transportation.  Patient was living with a friend who apparently does not want her back.  The friend dropped her off to dialysis today and refused to do any more.  On presentation patient was afebrile.  Labs were unremarkable.  Had her full session of dialysis without any problems today.  She has no place to go and came to ED for help. Continue to have some left arm pain.  Still having some edema and erythema but it seems improving than before.  Patient has no fever or chills.  No other symptoms except left arm pain.  Review of Systems: As mentioned in the  history of present illness. All other systems reviewed and are negative. Past Medical History:  Diagnosis Date   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[place])    Hypertension    Renal disorder    Type 2 diabetes mellitus ([place])    Past Surgical History:  Procedure Laterality Date   APPENDECTOMY     AV FISTULA PLACEMENT Left 03/16/2021   Procedure: INSERTION OF ARTERIOVENOUS (AV) GORE-TEX GRAFT ARM;  Surgeon: Algernon Huxley, MD;  Location: [facility] ORS;  Service: Vascular;  Laterality: Left;   DIALYSIS/PERMA CATHETER INSERTION N/A 05/16/2020   Procedure: DIALYSIS/PERMA CATHETER INSERTION;  Surgeon: Algernon Huxley, MD;  Location: [place] [facility];  Service: Cardiovascular;  Laterality: N/A;   MANDIBLE SURGERY     RENAL BIOPSY Right    RIGHT HEART CATH N/A 05/10/2020   Procedure: RIGHT HEART CATH;  Surgeon: Nelva Bush, MD;  Location: [place] [facility];  Service: Cardiovascular;  Laterality: N/A;   TEMPORARY DIALYSIS CATHETER N/A 05/11/2020   Procedure: TEMPORARY DIALYSIS CATHETER;  Surgeon: Algernon Huxley, MD;  Location: [place] [facility];  Service: Cardiovascular;  Laterality: N/A;   Social History:  reports that she has quit smoking. Her smoking use included cigarettes. She has never used smokeless tobacco. She reports that she does not currently use alcohol. She reports that she does not use drugs.  Allergies  Allergen Reactions   Penicillins Nausea And Vomiting    Pt states "only pills" make her vomit Other reaction(s): Nausea/Vomit  Family History  Problem Relation Age of Onset   Heart attack Mother    Asthma Mother    Uterine cancer Mother    Skin cancer Father    Prostate cancer Father    CAD Father    Parkinson's disease Father    Lung cancer Brother     Prior to Admission medications   Medication Sig Start Date End Date Taking? Authorizing Provider  acetaminophen (TYLENOL) 500 MG tablet Take 500 mg by mouth every 6 (six) hours as needed for moderate pain.     [provider]  amLODipine (NORVASC) 10 MG tablet Take 10 mg by mouth every evening. 07/25/21   [provider]  amoxicillin-clavulanate (AUGMENTIN) 250-125 MG tablet Take 1 tablet by mouth 2 (two) times daily for 7 days. 09/27/21 10/04/21  Lorella Nimrod, MD  calcium acetate (PHOSLO) 667 MG capsule Take 1 capsule (667 mg total) by mouth 3 (three) times daily with meals. Patient not taking: Reported on 09/24/2021 03/21/21   Sidney Ace, MD  carvedilol (COREG) 12.5 MG tablet Take 1 tablet (12.5 mg total) by mouth every evening. Patient not taking: Reported on 09/24/2021 03/21/21   Sidney Ace, MD  lactulose (CHRONULAC) 10 GM/15ML solution Take 22.5 mLs (15 g total) by mouth daily. Patient not taking: Reported on 09/24/2021 03/21/21   Ralene Muskrat B, MD  lidocaine (LIDODERM) 5 % Place 1 patch onto the skin daily. Remove & Discard patch within 12 hours or as directed by MD 03/21/21   Sidney Ace, MD  lidocaine-prilocaine (EMLA) cream Apply topically. 06/17/21   [provider]  oxyCODONE-acetaminophen (PERCOCET/ROXICET) 5-325 MG tablet Take 1-2 tablets by mouth every 4 (four) hours as needed for moderate pain or severe pain. 09/27/21   Lorella Nimrod, MD  senna-docusate (SENOKOT-S) 8.6-50 MG tablet Take 1 tablet by mouth 2 (two) times daily. Patient not taking: Reported on 09/24/2021 03/21/21   Sidney Ace, MD  tiZANidine (ZANAFLEX) 4 MG tablet Take 4 mg by mouth 2 (two) times daily as needed. 06/19/21   [provider]  topiramate (TOPAMAX) 50 MG tablet Take 1 tablet (50 mg total) by mouth 2 (two) times daily as needed (headache). 03/21/21   Sidney Ace, MD  traZODone (DESYREL) 50 MG tablet Take 1 tablet (50 mg total) by mouth every evening. 07/02/20 08/01/20  Nolberto Hanlon, MD  venlafaxine (EFFEXOR) 37.5 MG tablet Take 1 tablet (37.5 mg total) by mouth 2 (two) times daily with a meal. 07/02/20 08/01/20  Nolberto Hanlon, MD    Physical  Exam: Vitals:   09/30/21 1159 09/30/21 1515 09/30/21 1600 09/30/21 1630  BP: (!) 148/70 (!) 179/80 (!) 182/79 (!) 185/78  Pulse: 83 80 91 81  Resp: 20 18 16 14   Temp: 98.7 F (37.1 C)     TempSrc: Oral     SpO2: 93% 100% 98% 96%  Weight:      Height:       General.     In no acute distress. Pulmonary.  Lungs clear bilaterally, normal respiratory effort. CV.  Regular rate and rhythm, no JVD, rub or murmur. Abdomen.  Soft, nontender, nondistended, BS positive. CNS.  Alert and oriented .  No focal neurologic deficit. Extremities.  Mild edema of left upper extremity. Psychiatry.  Judgment and insight appears normal.   Data Reviewed:  Prior notes, labs and images reviewed   Family Communication: Discussed with patient Primary team communication: Discussed with ED provider that she is not appropriate for admission.  Thank you very much for involving [place] in the care of your patient.  Author: Lorella Nimrod, MD 09/30/2021 5:10 PM  For on call review [email or web address].

## 2021-09-30 NOTE — ED Notes (Signed)
Dr. Jari Pigg at bedside assessing pt.

## 2021-09-30 NOTE — ED Provider Notes (Signed)
[facility] Provider Note    Event Date/Time   First MD Initiated Contact with Patient 09/30/21 1501     (approximate)   History   Animal Bite   HPI  Katie [name] is a 66 y.o. female with ESRD on dialysis Tuesdays Thursdays Saturdays and received a full dialysis today, diabetes, hypertension who comes in with concerns for worsening hand pain.  On review of records patient was admitted on 5/13 until 5/17 and treated with broad-spectrum antibiotics and later transition to IV Unasyn which she tolerated.  Patient reports that upon discharge she has not been taking her antibiotics due to no way of filling it.  She reports that the people that she was previously living with have not kicked her out of the house and so she is homeless.  She reports increasing swelling and pain of the hand and needing help due to not having any place to live.   Physical Exam   Triage Vital Signs: ED Triage Vitals  Enc Vitals Group     BP 09/30/21 1159 (!) 148/70     Pulse Rate 09/30/21 1159 83     Resp 09/30/21 1159 20     Temp 09/30/21 1159 98.7 F (37.1 C)     Temp Source 09/30/21 1159 Oral     SpO2 09/30/21 1159 93 %     Weight 09/30/21 1157 183 lb (83 kg)     Height 09/30/21 1157 5\' 6"  (1.676 m)     Head Circumference --      Peak Flow --      Pain Score 09/30/21 1157 8     Pain Loc --      Pain Edu? --      Excl. in [place]? --     Most recent vital signs: Vitals:   09/30/21 1159 09/30/21 1515  BP: (!) 148/70 (!) 179/80  Pulse: 83 80  Resp: 20 18  Temp: 98.7 F (37.1 C)   SpO2: 93% 100%     General: Awake, no distress.  CV:  Good peripheral perfusion.  Resp:  Normal effort.  Abd:  No distention.  Other:  Patient has swelling noted to the hand with some healing wound marks with a little bit of erythema noted to the back of the hand and some decreased range of motion secondary to the swelling.  Good distal pulse.  Fistula has a positive thrill.   ED Results /  Procedures / Treatments   Labs (all labs ordered are listed, but only abnormal results are displayed) Labs Reviewed  CBC - Abnormal; Notable for the following components:      Result Value   RBC 3.83 (*)    Hemoglobin 11.5 (*)    RDW 16.3 (*)    All other components within normal limits  BASIC METABOLIC PANEL - Abnormal; Notable for the following components:   Potassium 3.4 (*)    Chloride 95 (*)    Creatinine, Ser 2.86 (*)    Calcium 8.4 (*)    GFR, Estimated 18 (*)    All other components within normal limits      RADIOLOGY I have reviewed the xray personally and interpreted it and there is no evidence of acute findings   PROCEDURES:  Critical Care performed: No  Procedures   MEDICATIONS ORDERED IN ED: Medications  Ampicillin-Sulbactam (UNASYN) 3 g in sodium chloride 0.9 % 100 mL IVPB (0 g Intravenous Stopped 09/30/21 1629)  oxyCODONE (Oxy IR/ROXICODONE) immediate release tablet 5  mg (5 mg Oral Given 09/30/21 1557)     IMPRESSION / MDM / ASSESSMENT AND PLAN / ED COURSE  I reviewed the triage vital signs and the nursing notes.  Patient comes in slightly hypertensive with concern for worsening left hand swelling the setting of not taking antibiotics for known dog bite.  This is concerning for acute life-threatening pathology such as worsening cellulitis.  Does not seem consistent with any abscess and exam not consistent with flexor tenosynovitis.  Will get x-ray just to ensure there is nothing deeper involving now.  Labs ordered evaluate for electrolyte abnormalities  BMP reviewed which showed elevated creatinine but patient is no dialysis.  CBC shows stable white count similar to prior.  I have consulted the hospitalist for admission due to my concern for hand swelling in the setting of dog bite requiring IV antibiotics, as well as patient being homeless and having unlikely chance of being able to get antibiotics. patient was seen by Dr. Reesa Chew who saw patient previously  who reports that her exam is improving and that this is a social issue with her not picking up her medications and that there is no indication for admission at this time.  We will place social work consult       FINAL CLINICAL IMPRESSION(S) / ED DIAGNOSES   Final diagnoses:  Dog bite, initial encounter  Homelessness  ESRD (end stage renal disease) ([place])     Rx / DC Orders   ED Discharge Orders     None        Note:  This document was prepared using Dragon voice recognition software and may include unintentional dictation errors.   Vanessa [place], MD 09/30/21 [phone]

## 2021-09-30 NOTE — ED Triage Notes (Signed)
Pt via EMS from [facility], pt did get full treatment. Pt was seen and admitted on 5/13 for a dog bite on L hand, pt was discharged 3 days ago. Pt states that the swelling is now travelling up her L arm. Pt has not been able to started the medication cause she has not been to the pharmacy. Pt is always homeless and states she needs help with finding somewhere to go. Pt is A&OX4 and NAD

## 2021-10-01 LAB — RESP PANEL BY RT-PCR (FLU A&B, COVID) ARPGX2
Influenza A by PCR: NEGATIVE
Influenza B by PCR: NEGATIVE
SARS Coronavirus 2 by RT PCR: NEGATIVE

## 2021-10-01 NOTE — Consult Note (Signed)
Referring Provider: No ref. provider found Primary Care Physician:  Pcp, No Primary Nephrologist:  Dr.   Luiz Iron for Consultation: ESRD.  HPI: 66 year old female with history of diabetes, peripheral vascular disease, hypertension, end-stage renal disease on dialysis on a TTS schedule now admitted with history of left arm swelling and redness after dog bite.  She was recently admitted for the same reason and was treated with IV antibiotics.  She had stable dialysis yesterday.  She had no place to go and hence she came to the emergency room yesterday.  Past Medical History:  Diagnosis Date   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[place])    Hypertension    Renal disorder    Type 2 diabetes mellitus ([place])     Past Surgical History:  Procedure Laterality Date   APPENDECTOMY     AV FISTULA PLACEMENT Left 03/16/2021   Procedure: INSERTION OF ARTERIOVENOUS (AV) GORE-TEX GRAFT ARM;  Surgeon: Algernon Huxley, MD;  Location: [facility];  Service: Vascular;  Laterality: Left;   DIALYSIS/PERMA CATHETER INSERTION N/A 05/16/2020   Procedure: DIALYSIS/PERMA CATHETER INSERTION;  Surgeon: Algernon Huxley, MD;  Location: [facility];  Service: Cardiovascular;  Laterality: N/A;   MANDIBLE SURGERY     RENAL BIOPSY Right    RIGHT HEART CATH N/A 05/10/2020   Procedure: RIGHT HEART CATH;  Surgeon: Nelva Bush, MD;  Location: [facility];  Service: Cardiovascular;  Laterality: N/A;   TEMPORARY DIALYSIS CATHETER N/A 05/11/2020   Procedure: TEMPORARY DIALYSIS CATHETER;  Surgeon: Algernon Huxley, MD;  Location: [facility];  Service: Cardiovascular;  Laterality: N/A;    Prior to Admission medications   Medication Sig Start Date End Date Taking? Authorizing Provider  acetaminophen (TYLENOL) 500 MG tablet Take 500 mg by mouth every 6 (six) hours as needed for moderate pain.    [provider]  amLODipine (NORVASC) 10 MG tablet Take 10 mg by mouth every evening. 07/25/21   [provider]  amoxicillin-clavulanate (AUGMENTIN) 250-125 MG tablet Take 1 tablet by mouth 2 (two) times daily for 7 days. Patient not taking: Reported on 09/30/2021 09/27/21 10/04/21  Lorella Nimrod, MD  calcium acetate (PHOSLO) 667 MG capsule Take 1 capsule (667 mg total) by mouth 3 (three) times daily with meals. 03/21/21   Sidney Ace, MD  carvedilol (COREG) 12.5 MG tablet Take 1 tablet (12.5 mg total) by mouth every evening. 03/21/21   Sidney Ace, MD  lactulose (CHRONULAC) 10 GM/15ML solution Take 22.5 mLs (15 g total) by mouth daily. Patient not taking: Reported on 09/24/2021 03/21/21   Ralene Muskrat B, MD  lidocaine (LIDODERM) 5 % Place 1 patch onto the skin daily. Remove & Discard patch within 12 hours or as directed by MD Patient not taking: Reported on 09/30/2021 03/21/21   Sidney Ace, MD  lidocaine-prilocaine (EMLA) cream Apply topically. 06/17/21   [provider]  oxyCODONE-acetaminophen (PERCOCET/ROXICET) 5-325 MG tablet Take 1-2 tablets by mouth every 4 (four) hours as needed for moderate pain or severe pain. 09/27/21   Lorella Nimrod, MD  senna-docusate (SENOKOT-S) 8.6-50 MG tablet Take 1 tablet by mouth 2 (two) times daily. 03/21/21   Sidney Ace, MD  tiZANidine (ZANAFLEX) 4 MG tablet Take 4 mg by mouth 2 (two) times daily as needed. 06/19/21   [provider]  topiramate (TOPAMAX) 50 MG tablet Take 1 tablet (50 mg total) by mouth 2 (two) times daily as needed (headache). 03/21/21  Sidney Ace, MD  traZODone (DESYREL) 50 MG tablet Take 1 tablet (50 mg total) by mouth every evening. 07/02/20 08/01/20  Nolberto Hanlon, MD  venlafaxine (EFFEXOR) 37.5 MG tablet Take 1 tablet (37.5 mg total) by mouth 2 (two) times daily with a meal. 07/02/20 08/01/20  Nolberto Hanlon, MD    Current Facility-Administered Medications  Medication Dose Route Frequency Provider Last Rate Last Admin   acetaminophen (TYLENOL) tablet 500 mg  500 mg Oral Q6H PRN Vanessa [place], MD       amLODipine (NORVASC) tablet 10 mg  10 mg Oral QPM Vanessa [place], MD   10 mg at 09/30/21 1958   amoxicillin-clavulanate (AUGMENTIN) 875-125 MG per tablet 1 tablet  1 tablet Oral Q12H Vanessa Henderson, MD   1 tablet at 10/01/21 0093   calcium acetate (PHOSLO) capsule 667 mg  667 mg Oral TID WC Vanessa Alba, MD   667 mg at 10/01/21 1149   carvedilol (COREG) tablet 12.5 mg  12.5 mg Oral QPM Vanessa Chamberlain, MD   12.5 mg at 09/30/21 1959   oxyCODONE (Oxy IR/ROXICODONE) immediate release tablet 5 mg  5 mg Oral Q6H PRN Vanessa [place] Aguirre, MD   5 mg at 10/01/21 0932   tiZANidine (ZANAFLEX) tablet 4 mg  4 mg Oral BID PRN Vanessa , MD       Current Outpatient Medications  Medication Sig Dispense Refill   acetaminophen (TYLENOL) 500 MG tablet Take 500 mg by mouth every 6 (six) hours as needed for moderate pain.     amLODipine (NORVASC) 10 MG tablet Take 10 mg by mouth every evening.     amoxicillin-clavulanate (AUGMENTIN) 250-125 MG tablet Take 1 tablet by mouth 2 (two) times daily for 7 days. (Patient not taking: Reported on 09/30/2021) 14 tablet 0   calcium acetate (PHOSLO) 667 MG capsule Take 1 capsule (667 mg total) by mouth 3 (three) times daily with meals.     carvedilol (COREG) 12.5 MG tablet Take 1 tablet (12.5 mg total) by mouth every evening.     lactulose (CHRONULAC) 10 GM/15ML solution Take 22.5 mLs (15 g total) by mouth daily. (Patient not taking: Reported on 09/24/2021) 236 mL 0   lidocaine (LIDODERM) 5 % Place 1 patch onto the skin daily. Remove & Discard patch within 12 hours or as directed by MD (Patient not taking: Reported on 09/30/2021) 30 patch 0   lidocaine-prilocaine (EMLA) cream Apply topically.     oxyCODONE-acetaminophen (PERCOCET/ROXICET) 5-325 MG tablet Take 1-2 tablets by mouth every 4 (four) hours as needed for moderate pain or severe pain. 30 tablet 0   senna-docusate (SENOKOT-S) 8.6-50 MG tablet Take 1 tablet by mouth 2 (two) times daily.     tiZANidine (ZANAFLEX) 4  MG tablet Take 4 mg by mouth 2 (two) times daily as needed.     topiramate (TOPAMAX) 50 MG tablet Take 1 tablet (50 mg total) by mouth 2 (two) times daily as needed (headache).     traZODone (DESYREL) 50 MG tablet Take 1 tablet (50 mg total) by mouth every evening. 30 tablet 0   venlafaxine (EFFEXOR) 37.5 MG tablet Take 1 tablet (37.5 mg total) by mouth 2 (two) times daily with a meal. 60 tablet 0    Allergies as of 09/30/2021 - Review Complete 09/30/2021  Allergen Reaction Noted   Penicillins Nausea And Vomiting 05/10/2020    Family History  Problem Relation Age of Onset   Heart attack Mother  Asthma Mother    Uterine cancer Mother    Skin cancer Father    Prostate cancer Father    CAD Father    Parkinson's disease Father    Lung cancer Brother     Social History   Socioeconomic History   Marital status: Single    Spouse name: Not on file   Number of children: Not on file   Years of education: Not on file   Highest education level: Not on file  Occupational History   Not on file  Tobacco Use   Smoking status: Former    Types: Cigarettes   Smokeless tobacco: Never  Vaping Use   Vaping Use: Never used  Substance and Sexual Activity   Alcohol use: Not Currently   Drug use: Never   Sexual activity: Not on file  Other Topics Concern   Not on file  Social History Narrative   Not on file   Social Determinants of Health   Financial Resource Strain: Not on file  Food Insecurity: Not on file  Transportation Needs: Not on file  Physical Activity: Not on file  Stress: Not on file  Social Connections: Not on file  Intimate Partner Violence: Not on file    Physical Exam: Vital signs in last 24 hours: Temp:  [98.1 F (36.7 C)] 98.1 F (36.7 C) (05/21 0748) Pulse Rate:  [72-91] 89 (05/21 0748) Resp:  [14-18] 18 (05/21 0748) BP: (124-185)/(53-82) 171/79 (05/21 0748) SpO2:  [89 %-100 %] 98 % (05/21 0748)   General:   Alert,  Well-developed, well-nourished,  pleasant and cooperative in NAD Head:  Normocephalic and atraumatic. Eyes:  Sclera clear, no icterus.   Conjunctiva pink. Ears:  Normal auditory acuity. Nose:  No deformity, discharge,  or lesions. Lungs:  Clear throughout to auscultation.   No wheezes, crackles, or rhonchi. No acute distress. Heart:  Regular rate and rhythm; no murmurs, clicks, rubs,  or gallops. Abdomen:  Soft, nontender and nondistended. No masses, hepatosplenomegaly or hernias noted. Normal bowel sounds, without guarding, and without rebound.   Extremities: Left arm swollen and red.  Intake/Output from previous day: 05/20 0701 - 05/21 0700 In: 100 [IV Piggyback:100] Out: -  Intake/Output this shift: No intake/output data recorded.  Lab Results: Recent Labs    09/30/21 1208  WBC 7.2  HGB 11.5*  HCT 37.4  PLT 206   BMET Recent Labs    09/30/21 1208  NA 138  K 3.4*  CL 95*  CO2 30  GLUCOSE 98  BUN 16  CREATININE 2.86*  CALCIUM 8.4*   LFT No results for input(s): PROT, ALBUMIN, AST, ALT, ALKPHOS, BILITOT, BILIDIR, IBILI in the last 72 hours. PT/INR No results for input(s): LABPROT, INR in the last 72 hours. Hepatitis Panel No results for input(s): HEPBSAG, HCVAB, HEPAIGM, HEPBIGM in the last 72 hours.  Studies/Results: DG Wrist Complete Left  Result Date: 09/30/2021 CLINICAL DATA:  Left wrist pain and swelling. Dog bite several days ago. EXAM: LEFT WRIST - COMPLETE 3+ VIEW COMPARISON:  None Available. FINDINGS: There is no evidence of fracture or dislocation. There is no evidence of arthropathy or other focal bone abnormality. Extensive peripheral vascular calcification noted. No radiopaque foreign body identified. IMPRESSION: No acute findings. Electronically Signed   By: Marlaine Hind M.D.   On: 09/30/2021 16:23    Assessment/Plan: ESRD: End-stage renal disease on dialysis via left AV fistula.  She had some redness and swelling of the left arm.  ANEMIA: We will continue anemia  protocol.  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  Continue PhosLo.  HTN/VOL: Blood pressure is better controlled with amlodipine and carvedilol.  ACCESS: Left arm AV fistula seems intact. Infection of left arm after dog bite: We will continue Augmentin as ordered.  We will arrange for next dialysis on Tuesday. Social work is following for placement.   LOS: 0 Lyla Son, MD [facility] @TODAY @12 :13 PM

## 2021-10-01 NOTE — ED Provider Notes (Addendum)
I have placed consult the order and message in [facility] to Dr. Joylene John regarding the patient's potential need for hemodialysis sessions as she is continuing boarding in the ER.   Delman Kitten, MD 10/01/21 [phone]  Dr. Joylene John acknowledged consult, and agrees to provide renal consult.    Delman Kitten, MD 10/01/21 [phone]

## 2021-10-01 NOTE — ED Notes (Signed)
Pt was placed on 2L oxygen because was satting in 80s while sleeping. Pt now awake, gave AM Phoslo. Pt alert and oriented. Pending social work consult.

## 2021-10-01 NOTE — ED Notes (Signed)
Lunch tray given. 

## 2021-10-01 NOTE — ED Notes (Signed)
Pt given lunch tray and new water.

## 2021-10-01 NOTE — ED Notes (Signed)
Nephrologist was just at bedside with pt. Pt will have dialysis on Tuesday (T Th Sat schedule).

## 2021-10-01 NOTE — ED Notes (Signed)
CSW was qat bedside with pt discussing placement options, including SNF.

## 2021-10-01 NOTE — TOC Progression Note (Addendum)
Transition of Care [facility]) - Progression Note    Patient Details  Name: Harleen Fineberg MRN: [id number] Date of Birth: Oct 29, 1955  Transition of Care [facility]) CM/SW [place], [place] Phone Number: 10/01/2021, 12:25 PM  Clinical Narrative:     CSW spoke to patient at bedside. Patient reported she cannot return to previous place of residency(with friend named Truman Hayward). Patient reported not being able to physically take care of herself and hopes to get a placement. Patient reported her vision is impaired and she cannot drive. Patient reported if TOC cannot find placement for her, to refer her to hospice.  Dialysis center has been informed of patient's transportation needs. They are following to see placement. Point of Contact- [place]. [phone])  Provider reported plan for PT to evaluate Monday. TOC plan of care ongoing.     Expected Discharge Plan and Services                                                 Social Determinants of Health (SDOH) Interventions    Readmission Risk Interventions    09/27/2021    1:44 PM 09/26/2021   12:46 PM 03/08/2021    2:49 PM  Readmission Risk Prevention Plan  Transportation Screening  Complete Complete  Palliative Care Screening Not Applicable    Medication Review (RN Care Manager)  Complete Complete  HRI or Home Care Consult   Complete  SW Recovery Care/Counseling Consult  Complete   Palliative Care Screening  Not Applicable   [place]  Not Applicable Complete

## 2021-10-01 NOTE — ED Provider Notes (Signed)
-----------------------------------------   6:11 AM on 10/01/2021 -----------------------------------------   Blood pressure 132/69, pulse 78, temperature 98.7 F (37.1 C), temperature source Oral, resp. rate 16, height 5\' 6"  (1.676 m), weight 83 kg, SpO2 92 %.  The patient is calm and cooperative at this time.  There have been no acute events since the last update.  Awaiting disposition plan from Social Work team.   Paulette Blanch, MD 10/01/21 [phone]

## 2021-10-01 NOTE — TOC Initial Note (Addendum)
Transition of Care [facility]) - Initial/Assessment Note    Patient Details  Name: Katie [name] MRN: [id number] Date of Birth: 30-Jun-1955  Transition [facility]) CM/SW Contact:    Janyth Contes, [place] Phone Number: 10/01/2021, 10:42 AM  Clinical Narrative:                    [facility] consult for homelessness and medications. CSW currently gathering helpful resources for the patient.   CSW awaiting called back from [facility] (in-house dialysis staff) to address patient's transportation needs to appointments.     Patient Goals and CMS Choice        Expected Discharge Plan and Services                                                Prior Living Arrangements/Services                       Activities of Daily Living      Permission Sought/Granted                  Emotional Assessment              Admission diagnosis:  L swollen hand dog bite ---ems Patient Active Problem List   Diagnosis Date Noted   Infected dog bite of hand including fingers, left, initial encounter 09/23/2021   Right flank pain 09/23/2021   Iron deficiency anemia, unspecified 03/27/2021   Allergy, unspecified, initial encounter 03/22/2021   Anaphylactic shock, unspecified, initial encounter 03/22/2021   Unspecified jaundice 03/15/2021   Allergy status to penicillin 03/14/2021   Anemia in chronic kidney disease 03/14/2021   Chronic diastolic (congestive) heart failure ([place]) 03/14/2021   Dependence on renal dialysis ([place]) 03/14/2021   Depression, unspecified 03/14/2021   Hypertensive heart and chronic kidney disease with heart failure and with stage 5 chronic kidney disease, or end stage renal disease ([place]) 03/14/2021   Personal history of nicotine dependence 03/14/2021   Secondary hyperparathyroidism of renal origin ([place]) 03/14/2021   Frequent falls 03/07/2021   Homelessness 03/07/2021   Financial difficulty 03/07/2021   ESRD (end stage renal disease) ([place])  12/23/2020   Persistent headaches 06/27/2020   ATN (acute tubular necrosis) ([place])    Pulmonary hypertension, unspecified (HCC)    Chronic kidney disease (CKD), stage IV (severe) (HCC)    Pleural effusion    Acute heart failure with preserved ejection fraction (HFpEF) ([place])    Pressure injury of skin 05/01/2020   Hyperkalemia    Volume overload 04/30/2020   Type 2 diabetes mellitus with stage 5 chronic kidney disease ([place]) 04/30/2020   Hypertension    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[place])    Type 2 diabetes mellitus ([place])    PCP:  Pcp, No Pharmacy:   [id number] [place], [name] [address] Phone: [phone] Fax: [place] [address]), [place] - [place] ([place]) [place] [address] Phone: [phone] Fax: [place], [name] [address] Phone: [phone] Fax: [phone]     Social Determinants of Health (SDOH) Interventions    Readmission Risk Interventions  09/27/2021    1:44 PM 09/26/2021   12:46 PM 03/08/2021    2:49 PM  Readmission Risk Prevention Plan  Transportation Screening  Complete Complete  Palliative Care Screening Not Applicable    Medication Review (RN Care Manager)  Complete Complete  HRI or Home Care Consult   Complete  SW Recovery Care/Counseling Consult  Complete   Palliative Care Screening  Not Applicable   Skilled Nursing Facility  Not Applicable Complete

## 2021-10-02 MED ORDER — AMOXICILLIN-POT CLAVULANATE 500-125 MG PO TABS
1.0000 | ORAL_TABLET | Freq: Two times a day (BID) | ORAL | Status: DC
  Administered 2021-10-02 – 2021-10-13 (×22): 500 mg via ORAL
  Filled 2021-10-02 (×23): qty 1

## 2021-10-02 MED ORDER — CHLORHEXIDINE GLUCONATE CLOTH 2 % EX PADS
6.0000 | MEDICATED_PAD | Freq: Every day | CUTANEOUS | Status: DC
  Administered 2021-10-05 – 2021-11-24 (×30): 6 via TOPICAL
  Filled 2021-10-02 (×22): qty 6

## 2021-10-02 NOTE — ED Provider Notes (Signed)
Today's Vitals   10/02/21 0030 10/02/21 0100 10/02/21 0130 10/02/21 0230  BP: (!) 109/52 (!) 112/51 (!) 109/47 125/62  Pulse: 68 68 67 70  Resp:      Temp:      TempSrc:      SpO2: 96% 94% 94% 95%  Weight:      Height:      PainSc:       Body mass index is 29.54 kg/m.  No acute events overnight.  Awaiting social work disposition.   Shaunn Tackitt, Delice Bison, DO 10/02/21 [phone]

## 2021-10-02 NOTE — ED Notes (Addendum)
Breakfast tray given at this time. No needs expressed at this time.   Pt asking when social worker will be here. Explained that this RN was not sure about their schedule but they would see her at some point. Pt crying stating that she has been living with her bestfriend of 20 years and since their dog bit her the relationship has been going down hill since. States that they will not cook for her or help her with anything and states that she does not think she would be able to care for her self and would not be able to live on the streets.   Pt is a dialysis patient with access on the L upper arm. Dialysis is T Th S for this patient.

## 2021-10-02 NOTE — ED Notes (Signed)
Nephrology team at bedside.

## 2021-10-02 NOTE — Progress Notes (Signed)
[facility]  ROUNDING NOTE   Subjective:   Patient is a 66 year old female with past medical history of ESRD with hemodialysis TTS, diabetes mellitus, hypertension to the hospital seeking long term placement. She was recently admitted and received antibiotics for a dog bite.  She was discharged with a prescription for home antibiotics.  She states her caregivers were unable to retrieve antibiotics.  She states that there was an argument and that she is here seeking long-term placement.  Patient receives outpatient dialysis treatments at [facility] on a TTS schedule.  Supervised by Dr. Holley Raring.  Last treatment received on Saturday.  Patient denies nausea, vomiting, diarrhea, shortness of breath, fever or chills.  Denies pain or discomfort.  States her hand feels better, less swelling, and she is able to flex fingers.  She states she does not feel safe at home, not physically in danger but unable to explain. States she is nervous being in the same house as the dog that bite her.   We have been consulted to manage dialysis needs while placement is determined.   Objective:  Vital signs in last 24 hours:  Pulse Rate:  [67-87] 75 (05/22 1300) Resp:  [18-20] 18 (05/22 1300) BP: (97-167)/(47-73) 167/72 (05/22 1300) SpO2:  [89 %-98 %] 98 % (05/22 1300)  Weight change:  Filed Weights   09/30/21 1157  Weight: 83 kg    Intake/Output: No intake/output data recorded.   Intake/Output this shift:  No intake/output data recorded.  Physical Exam: General: No acute distress  Head: Normocephalic, atraumatic. Moist oral mucosal membranes  Eyes: Anicteric  Lungs:  Clear to auscultation, normal effort  Heart: S1S2 no rubs  Abdomen:  Soft, nontender, bowel sounds present  Extremities: No lower extremity edema or cyanosis.  Left third finger edema and erythema  Neurologic: Awake, alert, following commands  Skin: No acute rash  Access: R Permacath.  L AVG    Basic Metabolic  Panel: Recent Labs  Lab 09/26/21 0446 09/27/21 0405 09/30/21 1208  NA 136 138 138  K 4.4 4.4 3.4*  CL 98 99 95*  CO2 26 29 30   GLUCOSE 84 96 98  BUN 46* 23 16  CREATININE 5.38* 3.63* 2.86*  CALCIUM 8.8* 8.7* 8.4*  MG 2.0 2.1  --      Liver Function Tests: No results for input(s): AST, ALT, ALKPHOS, BILITOT, PROT, ALBUMIN in the last 168 hours.  No results for input(s): LIPASE, AMYLASE in the last 168 hours.  No results for input(s): AMMONIA in the last 168 hours.  CBC: Recent Labs  Lab 09/26/21 0446 09/27/21 0405 09/30/21 1208  WBC 6.7 6.8 7.2  HGB 11.7* 11.7* 11.5*  HCT 37.2 37.7 37.4  MCV 94.9 96.7 97.7  PLT 229 209 206     Cardiac Enzymes: No results for input(s): CKTOTAL, CKMB, CKMBINDEX, TROPONINI in the last 168 hours.  BNP: Invalid input(s): POCBNP  CBG: Recent Labs  Lab 09/26/21 1129 09/26/21 1633 09/26/21 2140 09/27/21 0742 09/27/21 1142  GLUCAP 101* 94 74 96 80     Microbiology: Results for orders placed or performed during the hospital encounter of 09/30/21  Resp Panel by RT-PCR (Flu A&B, Covid) Nasopharyngeal Swab     Status: None   Collection Time: 09/30/21  9:34 AM   Specimen: Nasopharyngeal Swab; Nasopharyngeal(NP) swabs in vial transport medium  Result Value Ref Range Status   SARS Coronavirus 2 by RT PCR NEGATIVE NEGATIVE Final    Comment: (NOTE) SARS-CoV-2 target nucleic acids are NOT  DETECTED.  The SARS-CoV-2 RNA is generally detectable in upper respiratory specimens during the acute phase of infection. The lowest concentration of SARS-CoV-2 viral copies this assay can detect is 138 copies/mL. A negative result does not preclude SARS-Cov-2 infection and should not be used as the sole basis for treatment or other patient management decisions. A negative result may occur with  improper specimen collection/handling, submission of specimen other than nasopharyngeal swab, presence of viral mutation(s) within the areas targeted  by this assay, and inadequate number of viral copies(<138 copies/mL). A negative result must be combined with clinical observations, patient history, and epidemiological information. The expected result is Negative.  Fact Sheet for Patients:  EntrepreneurPulse.com.au  Fact Sheet for Healthcare Providers:  IncredibleEmployment.be  This test is no t yet approved or cleared by the [place] FDA and  has been authorized for detection and/or diagnosis of SARS-CoV-2 by FDA under an Emergency Use Authorization (EUA). This EUA will remain  in effect (meaning this test can be used) for the duration of the COVID-19 declaration under Section 564(b)(1) of the Act, 21 U.S.C.section 360bbb-3(b)(1), unless the authorization is terminated  or revoked sooner.       Influenza A by PCR NEGATIVE NEGATIVE Final   Influenza B by PCR NEGATIVE NEGATIVE Final    Comment: (NOTE) The Xpert Xpress SARS-CoV-2/FLU/RSV plus assay is intended as an aid in the diagnosis of influenza from Nasopharyngeal swab specimens and should not be used as a sole basis for treatment. Nasal washings and aspirates are unacceptable for Xpert Xpress SARS-CoV-2/FLU/RSV testing.  Fact Sheet for Patients: EntrepreneurPulse.com.au  Fact Sheet for Healthcare Providers: IncredibleEmployment.be  This test is not yet approved or cleared by the [place] FDA and has been authorized for detection and/or diagnosis of SARS-CoV-2 by FDA under an Emergency Use Authorization (EUA). This EUA will remain in effect (meaning this test can be used) for the duration of the COVID-19 declaration under Section 564(b)(1) of the Act, 21 U.S.C. section 360bbb-3(b)(1), unless the authorization is terminated or revoked.  Performed at [facility], [address] [place], [place] [address]     Coagulation Studies: No results for input(s): LABPROT, INR in the  last 72 hours.  Urinalysis: No results for input(s): COLORURINE, LABSPEC, PHURINE, GLUCOSEU, HGBUR, BILIRUBINUR, KETONESUR, PROTEINUR, UROBILINOGEN, NITRITE, LEUKOCYTESUR in the last 72 hours.  Invalid input(s): APPERANCEUR     Imaging: DG Wrist Complete Left  Result Date: 09/30/2021 CLINICAL DATA:  Left wrist pain and swelling. Dog bite several days ago. EXAM: LEFT WRIST - COMPLETE 3+ VIEW COMPARISON:  None Available. FINDINGS: There is no evidence of fracture or dislocation. There is no evidence of arthropathy or other focal bone abnormality. Extensive peripheral vascular calcification noted. No radiopaque foreign body identified. IMPRESSION: No acute findings. Electronically Signed   By: Marlaine Hind M.D.   On: 09/30/2021 16:23     Medications:      amLODipine  10 mg Oral QPM   amoxicillin-clavulanate  1 tablet Oral Q12H   calcium acetate  667 mg Oral TID WC   carvedilol  12.5 mg Oral QPM   acetaminophen, oxyCODONE, tiZANidine  Assessment/ Plan:  66 y.o. female with past medical history significant for ESRD on HD TTS, diabetes type 2, hypertension presented to the hospital seeking long term placement.   ESRD on hemodialysis on TTS.   Next treatment scheduled for Tuesday.  Hypertension. Continue amlodipine and carvedilol.  Blood pressure 167/72  Anemia of chronic disease continue to monitor H&H latest  hemoglobin remains 11.5.  Secondary hyperparathyroidism.  Will continue to monitor bone minerals during this admission. Continue with binders.   Infected dog bite of hand including left finger. Receiving oral antibiotics   LOS: 0 Retta Pitcher 5/22/20233:53 PM

## 2021-10-02 NOTE — NC FL2 (Addendum)
[place] LEVEL OF CARE SCREENING TOOL     IDENTIFICATION  Patient Name: Katie [name] Birthdate: 06/21/1955 Sex: female Admission Date (Current Location): 09/30/2021  [place] and [place] Number:  Engineering geologist and Address:  [facility], [address] [place], [place] [address]      Provider Number: [id number]  Attending Physician Name and Address:  No att. providers found  Relative Name and Phone Number:       Current Level of Care: Other (Comment) (ED boarder) Recommended Level of Care: [place] Prior Approval Number:    Date Approved/Denied:   PASRR Number: [id number]  Discharge Plan: SNF    Current Diagnoses: Patient Active Problem List   Diagnosis Date Noted   Infected dog bite of hand including fingers, left, initial encounter 09/23/2021   Right flank pain 09/23/2021   Iron deficiency anemia, unspecified 03/27/2021   Allergy, unspecified, initial encounter 03/22/2021   Anaphylactic shock, unspecified, initial encounter 03/22/2021   Unspecified jaundice 03/15/2021   Allergy status to penicillin 03/14/2021   Anemia in chronic kidney disease 03/14/2021   Chronic diastolic (congestive) heart failure ([place]) 03/14/2021   Dependence on renal dialysis ([place]) 03/14/2021   Depression, unspecified 03/14/2021   Hypertensive heart and chronic kidney disease with heart failure and with stage 5 chronic kidney disease, or end stage renal disease ([place]) 03/14/2021   Personal history of nicotine dependence 03/14/2021   Secondary hyperparathyroidism of renal origin ([place]) 03/14/2021   Frequent falls 03/07/2021   Homelessness 03/07/2021   Financial difficulty 03/07/2021   ESRD (end stage renal disease) ([place]) 12/23/2020   Persistent headaches 06/27/2020   ATN (acute tubular necrosis) ([place])    Pulmonary hypertension, unspecified (HCC)    Chronic kidney disease (CKD), stage IV (severe) (HCC)    Pleural effusion     Acute heart failure with preserved ejection fraction (HFpEF) ([place])    Pressure injury of skin 05/01/2020   Hyperkalemia    Volume overload 04/30/2020   Type 2 diabetes mellitus with stage 5 chronic kidney disease ([place]) 04/30/2020   Hypertension    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HCC)    Type 2 diabetes mellitus (HCC)     Orientation RESPIRATION BLADDER Height & Weight     Self, Time, Situation, Place  Normal Continent Weight: 83 kg Height:  5\' 6"  (167.6 cm)  BEHAVIORAL SYMPTOMS/MOOD NEUROLOGICAL BOWEL NUTRITION STATUS      Continent Diet (Regular diet)  AMBULATORY STATUS COMMUNICATION OF NEEDS Skin   Limited Assist Verbally Normal                       Personal Care Assistance Level of Assistance  Bathing, Feeding, Dressing Bathing Assistance: Limited assistance Feeding assistance: Independent Dressing Assistance: Limited assistance     Functional Limitations Info  Sight, Hearing, Speech Sight Info: Adequate Hearing Info: Adequate Speech Info: Adequate    SPECIAL CARE FACTORS FREQUENCY  PT (By licensed PT), OT (By licensed OT)     PT Frequency: min 3 times per week OT Frequency: min 3 times per week            Contractures Contractures Info: Not present    Additional Factors Info  Code Status, Allergies Code Status Info: Full Allergies Info: Penicillins           Current Medications (10/02/2021):  This is the current hospital active medication list Current Facility-Administered Medications  Medication Dose Route Frequency Provider  Last Rate Last Admin   acetaminophen (TYLENOL) tablet 500 mg  500 mg Oral Q6H PRN Vanessa [place], MD       amLODipine (NORVASC) tablet 10 mg  10 mg Oral QPM Vanessa [place], MD   10 mg at 10/01/21 2120   amoxicillin-clavulanate (AUGMENTIN) 875-125 MG per tablet 1 tablet  1 tablet Oral Q12H Vanessa [place], MD   1 tablet at 10/02/21 3149   calcium acetate (PHOSLO) capsule 667 mg  667 mg Oral TID WC Vanessa [place], MD    667 mg at 10/02/21 7026   carvedilol (COREG) tablet 12.5 mg  12.5 mg Oral QPM Vanessa [place], MD   12.5 mg at 10/01/21 2120   oxyCODONE (Oxy IR/ROXICODONE) immediate release tablet 5 mg  5 mg Oral Q6H PRN Vanessa [place], MD   5 mg at 10/02/21 3785   tiZANidine (ZANAFLEX) tablet 4 mg  4 mg Oral BID PRN Vanessa [place], MD       Current Outpatient Medications  Medication Sig Dispense Refill   acetaminophen (TYLENOL) 500 MG tablet Take 500 mg by mouth every 6 (six) hours as needed for moderate pain.     amLODipine (NORVASC) 10 MG tablet Take 10 mg by mouth every evening.     amoxicillin-clavulanate (AUGMENTIN) 250-125 MG tablet Take 1 tablet by mouth 2 (two) times daily for 7 days. (Patient not taking: Reported on 09/30/2021) 14 tablet 0   calcium acetate (PHOSLO) 667 MG capsule Take 1 capsule (667 mg total) by mouth 3 (three) times daily with meals.     carvedilol (COREG) 12.5 MG tablet Take 1 tablet (12.5 mg total) by mouth every evening.     lactulose (CHRONULAC) 10 GM/15ML solution Take 22.5 mLs (15 g total) by mouth daily. (Patient not taking: Reported on 09/24/2021) 236 mL 0   lidocaine (LIDODERM) 5 % Place 1 patch onto the skin daily. Remove & Discard patch within 12 hours or as directed by MD (Patient not taking: Reported on 09/30/2021) 30 patch 0   lidocaine-prilocaine (EMLA) cream Apply topically.     oxyCODONE-acetaminophen (PERCOCET/ROXICET) 5-325 MG tablet Take 1-2 tablets by mouth every 4 (four) hours as needed for moderate pain or severe pain. 30 tablet 0   senna-docusate (SENOKOT-S) 8.6-50 MG tablet Take 1 tablet by mouth 2 (two) times daily.     tiZANidine (ZANAFLEX) 4 MG tablet Take 4 mg by mouth 2 (two) times daily as needed.     topiramate (TOPAMAX) 50 MG tablet Take 1 tablet (50 mg total) by mouth 2 (two) times daily as needed (headache).     traZODone (DESYREL) 50 MG tablet Take 1 tablet (50 mg total) by mouth every evening. 30 tablet 0   venlafaxine (EFFEXOR) 37.5 MG tablet Take 1  tablet (37.5 mg total) by mouth 2 (two) times daily with a meal. 60 tablet 0     Discharge Medications: Please see discharge summary for a list of discharge medications.  Relevant Imaging Results:  Relevant Lab Results:   Additional Information ss [id number]  Dialysis T TH [address]  Shelbie Hutching, RN

## 2021-10-02 NOTE — Progress Notes (Signed)
PHARMACY NOTE:  ANTIMICROBIAL RENAL DOSAGE ADJUSTMENT  Current antimicrobial regimen includes a mismatch between antimicrobial dosage and estimated renal function.  As per policy approved by the Pharmacy & Therapeutics and Medical Executive Committees, the antimicrobial dosage will be adjusted accordingly.  Current antimicrobial dosage:  Augmentin 875mg  q12h  Indication: animal bite  Renal Function:  Estimated Creatinine Clearance: 21.9 mL/min (A) (by C-G formula based on SCr of 2.86 mg/dL (H)). []      On intermittent HD, scheduled: []      On CRRT    Antimicrobial dosage has been changed to:  Augmentin 500mg  q12h  Additional comments:   Thank you for allowing pharmacy to be a part of this patient's care.  Paulina Fusi, PharmD, BCPS 10/02/2021 2:56 PM

## 2021-10-02 NOTE — TOC Progression Note (Signed)
Transition of Care [facility]) - Progression Note    Patient Details  Name: Katie [name] MRN: [id number] Date of Birth: 1955/11/04  Transition of Care [facility]) CM/SW Contact  Shelbie Hutching, RN Phone Number: 10/02/2021, 11:07 AM  Clinical Narrative:    RNCM starting bed search for long term care.     Expected Discharge Plan: Skilled Nursing Facility Barriers to Discharge: SNF Pending bed offer  Expected Discharge Plan and Services Expected Discharge Plan: [place]   Discharge Planning Services: CM Consult Post Acute Care Choice: [place] Living arrangements for the past 2 months: Single Family Home                 DME Arranged: N/A DME Agency: NA       HH Arranged: NA HH Agency: NA         Social Determinants of Health (SDOH) Interventions    Readmission Risk Interventions    09/27/2021    1:44 PM 09/26/2021   12:46 PM 03/08/2021    2:49 PM  Readmission Risk Prevention Plan  Transportation Screening  Complete Complete  Palliative Care Screening Not Applicable    Medication Review (RN Care Manager)  Complete Complete  HRI or Home Care Consult   Complete  SW Recovery Care/Counseling Consult  Complete   Palliative Care Screening  Not Applicable   [place]  Not Applicable Complete

## 2021-10-03 LAB — CBC
HCT: 35.4 % — ABNORMAL LOW (ref 36.0–46.0)
Hemoglobin: 10.9 g/dL — ABNORMAL LOW (ref 12.0–15.0)
MCH: 29.2 pg (ref 26.0–34.0)
MCHC: 30.8 g/dL (ref 30.0–36.0)
MCV: 94.9 fL (ref 80.0–100.0)
Platelets: 169 10*3/uL (ref 150–400)
RBC: 3.73 MIL/uL — ABNORMAL LOW (ref 3.87–5.11)
RDW: 15.8 % — ABNORMAL HIGH (ref 11.5–15.5)
WBC: 6.7 10*3/uL (ref 4.0–10.5)
nRBC: 0 % (ref 0.0–0.2)

## 2021-10-03 LAB — RENAL FUNCTION PANEL
Albumin: 2.9 g/dL — ABNORMAL LOW (ref 3.5–5.0)
Anion gap: 10 (ref 5–15)
BUN: 14 mg/dL (ref 8–23)
CO2: 29 mmol/L (ref 22–32)
Calcium: 8.5 mg/dL — ABNORMAL LOW (ref 8.9–10.3)
Chloride: 98 mmol/L (ref 98–111)
Creatinine, Ser: 3.05 mg/dL — ABNORMAL HIGH (ref 0.44–1.00)
GFR, Estimated: 17 mL/min — ABNORMAL LOW (ref >60)
Glucose, Bld: 101 mg/dL — ABNORMAL HIGH (ref 70–99)
Phosphorus: 2.5 mg/dL (ref 2.5–4.6)
Potassium: 3.2 mmol/L — ABNORMAL LOW (ref 3.5–5.1)
Sodium: 137 mmol/L (ref 135–145)

## 2021-10-03 LAB — HEPATITIS B SURFACE ANTIGEN: Hepatitis B Surface Ag: NONREACTIVE

## 2021-10-03 LAB — HEPATITIS B SURFACE ANTIBODY,QUALITATIVE: Hep B S Ab: NONREACTIVE

## 2021-10-03 MED ORDER — ALTEPLASE 2 MG IJ SOLR
2.0000 mg | Freq: Once | INTRAMUSCULAR | Status: AC | PRN
  Administered 2021-10-03: 2 mg

## 2021-10-03 MED ORDER — SODIUM CHLORIDE 0.9 % IV SOLN: 100.0000 mL | INTRAVENOUS | Status: DC | PRN

## 2021-10-03 MED ORDER — PENTAFLUOROPROP-TETRAFLUOROETH EX AERO: 1.0000 "application " | INHALATION_SPRAY | CUTANEOUS | Status: DC | PRN

## 2021-10-03 MED ORDER — HEPARIN SODIUM (PORCINE) 1000 UNIT/ML DIALYSIS
1000.0000 [IU] | INTRAMUSCULAR | Status: DC | PRN
  Administered 2021-10-03: 1000 [IU] via INTRAVENOUS_CENTRAL
  Filled 2021-10-03: qty 1

## 2021-10-03 MED ORDER — ALTEPLASE 2 MG IJ SOLR
INTRAMUSCULAR | Status: AC
  Filled 2021-10-03: qty 2

## 2021-10-03 MED ORDER — LIDOCAINE HCL (PF) 1 % IJ SOLN: 5.0000 mL | INTRAMUSCULAR | Status: DC | PRN

## 2021-10-03 MED ORDER — HEPARIN SODIUM (PORCINE) 1000 UNIT/ML IJ SOLN
INTRAMUSCULAR | Status: AC
  Filled 2021-10-03: qty 10

## 2021-10-03 MED ORDER — STERILE WATER FOR INJECTION IJ SOLN
INTRAMUSCULAR | Status: AC
  Filled 2021-10-03: qty 10

## 2021-10-03 MED ORDER — LIDOCAINE-PRILOCAINE 2.5-2.5 % EX CREA: 1.0000 "application " | TOPICAL_CREAM | CUTANEOUS | Status: DC | PRN

## 2021-10-03 NOTE — TOC Progression Note (Signed)
Transition of Care [facility]) - Progression Note    Patient Details  Name: Katie [name] MRN: [id number] Date of Birth: 06-28-1955  [facility] CM/SW Contact  Shelbie Hutching, RN Phone Number: 10/03/2021, 1:22 PM  Clinical Narrative:    RNCM met with patient at the bedside this morning, she reports that she is waiting to go to dialysis.  Patient reports that she needs long term care that she cannot take care of herself.  She cant drive or fix meals, she says she can't even put her shoes on by herself.  RNCM has started bed search and three facilities are considering.  Patient would prefer to stay in [facility] because she really likes her dialysis center here. Patient also reports that she has [employer] as well as Medicaid.  RNCM reached out to financial counselor to investigate Medicare.     Expected Discharge Plan: Skilled Nursing Facility Barriers to Discharge: SNF Pending bed offer  Expected Discharge Plan and Services Expected Discharge Plan: [place]   Discharge Planning Services: CM Consult Post Acute Care Choice: [place] Living arrangements for the past 2 months: Single Family Home                 DME Arranged: N/A DME Agency: NA       HH Arranged: NA HH Agency: NA         Social Determinants of Health (SDOH) Interventions    Readmission Risk Interventions    09/27/2021    1:44 PM 09/26/2021   12:46 PM 03/08/2021    2:49 PM  Readmission Risk Prevention Plan  Transportation Screening  Complete Complete  Palliative Care Screening Not Applicable    Medication Review (RN Care Manager)  Complete Complete  HRI or Home Care Consult   Complete  SW Recovery Care/Counseling Consult  Complete   Palliative Care Screening  Not Applicable   [place]  Not Applicable Complete

## 2021-10-03 NOTE — ED Notes (Signed)
Patient taken to dialysis by transport at this time.

## 2021-10-03 NOTE — ED Notes (Signed)
Pt appears to be sleeping, even RR and unlabored, NAD noted, call bell in reach, side rails up x2 for safety, care on going, will continue to monitor. 

## 2021-10-03 NOTE — ED Notes (Signed)
This Probation officer has called dietary multiple times to try and get a warm diner tray for this patient. Patient aware

## 2021-10-03 NOTE — ED Notes (Signed)
Pt resting and watching TV, she aware she is currently waiting on dialysis at this time, even RR and unlabored, NAD noted, call bell in reach, side rails up x2 for safety, care on going, will continue to monitor.

## 2021-10-03 NOTE — ED Provider Notes (Signed)
Today's Vitals   10/03/21 0015 10/03/21 0100 10/03/21 0200 10/03/21 0300  BP: 124/67  (!) 124/58   Pulse: 74  72   Resp: 19  17   Temp:      TempSrc:      SpO2: 98%  98%   Weight:      Height:      PainSc:  Asleep Asleep Asleep   Body mass index is 29.54 kg/m.   No acute events overnight.  Awaiting social work disposition.   Darlinda Bellows, Delice Bison, DO 10/03/21 [phone]

## 2021-10-03 NOTE — ED Notes (Signed)
Pt awoke when entering the room, introduce self to pt, address any needs and concerns, able to obtain vitals, no needs at this time, call bell in reach, safety measures in place, POC updated to pt, pt verbalized understanding. Will continue to monitor.

## 2021-10-03 NOTE — ED Notes (Signed)
Pt given breakfast tray

## 2021-10-03 NOTE — Progress Notes (Addendum)
[facility]  ROUNDING NOTE   Subjective:   Patient is a 66 year old female with past medical history of ESRD with hemodialysis TTS, diabetes mellitus, hypertension to the hospital seeking long term placement. She was recently admitted and received antibiotics for a dog bite.  She was discharged with a prescription for home antibiotics.  She states her caregivers were unable to retrieve antibiotics.  She states that there was an argument and that she is here seeking long-term placement.  Patient receives outpatient dialysis treatments at [facility] on a TTS schedule.  Supervised by Dr. Holley Raring.    Update Patient seen resting on bed Denies pain and discomfort Denies shortness of breath, remains on room air   Objective:  Vital signs in last 24 hours:  Pulse Rate:  [66-75] 68 (05/23 1104) Resp:  [16-19] 18 (05/23 1104) BP: (124-167)/(58-72) 129/64 (05/23 1104) SpO2:  [98 %-100 %] 100 % (05/23 1104)  Weight change:  Filed Weights   09/30/21 1157  Weight: 83 kg    Intake/Output: No intake/output data recorded.   Intake/Output this shift:  No intake/output data recorded.  Physical Exam: General: No acute distress  Head: Normocephalic, atraumatic. Moist oral mucosal membranes  Eyes: Anicteric  Lungs:  Clear to auscultation, normal effort  Heart: S1S2 no rubs  Abdomen:  Soft, nontender, bowel sounds present  Extremities: No lower extremity edema or cyanosis.  Left third finger edema and erythema  Neurologic: Awake, alert, following commands  Skin: No acute rash  Access: R Permacath.  L AVG    Basic Metabolic Panel: Recent Labs  Lab 09/27/21 0405 09/30/21 1208  NA 138 138  K 4.4 3.4*  CL 99 95*  CO2 29 30  GLUCOSE 96 98  BUN 23 16  CREATININE 3.63* 2.86*  CALCIUM 8.7* 8.4*  MG 2.1  --      Liver Function Tests: No results for input(s): AST, ALT, ALKPHOS, BILITOT, PROT, ALBUMIN in the last 168 hours.  No results for input(s): LIPASE, AMYLASE  in the last 168 hours.  No results for input(s): AMMONIA in the last 168 hours.  CBC: Recent Labs  Lab 09/27/21 0405 09/30/21 1208  WBC 6.8 7.2  HGB 11.7* 11.5*  HCT 37.7 37.4  MCV 96.7 97.7  PLT 209 206     Cardiac Enzymes: No results for input(s): CKTOTAL, CKMB, CKMBINDEX, TROPONINI in the last 168 hours.  BNP: Invalid input(s): POCBNP  CBG: Recent Labs  Lab 09/26/21 1633 09/26/21 2140 09/27/21 0742 09/27/21 1142  GLUCAP 94 74 96 80     Microbiology: Results for orders placed or performed during the hospital encounter of 09/30/21  Resp Panel by RT-PCR (Flu A&B, Covid) Nasopharyngeal Swab     Status: None   Collection Time: 09/30/21  9:34 AM   Specimen: Nasopharyngeal Swab; Nasopharyngeal(NP) swabs in vial transport medium  Result Value Ref Range Status   SARS Coronavirus 2 by RT PCR NEGATIVE NEGATIVE Final    Comment: (NOTE) SARS-CoV-2 target nucleic acids are NOT DETECTED.  The SARS-CoV-2 RNA is generally detectable in upper respiratory specimens during the acute phase of infection. The lowest concentration of SARS-CoV-2 viral copies this assay can detect is 138 copies/mL. A negative result does not preclude SARS-Cov-2 infection and should not be used as the sole basis for treatment or other patient management decisions. A negative result may occur with  improper specimen collection/handling, submission of specimen other than nasopharyngeal swab, presence of viral mutation(s) within the areas targeted by this assay, and  inadequate number of viral copies(<138 copies/mL). A negative result must be combined with clinical observations, patient history, and epidemiological information. The expected result is Negative.  Fact Sheet for Patients:  EntrepreneurPulse.com.au  Fact Sheet for Healthcare Providers:  IncredibleEmployment.be  This test is no t yet approved or cleared by the [place] FDA and  has been  authorized for detection and/or diagnosis of SARS-CoV-2 by FDA under an Emergency Use Authorization (EUA). This EUA will remain  in effect (meaning this test can be used) for the duration of the COVID-19 declaration under Section 564(b)(1) of the Act, 21 U.S.C.section 360bbb-3(b)(1), unless the authorization is terminated  or revoked sooner.       Influenza A by PCR NEGATIVE NEGATIVE Final   Influenza B by PCR NEGATIVE NEGATIVE Final    Comment: (NOTE) The Xpert Xpress SARS-CoV-2/FLU/RSV plus assay is intended as an aid in the diagnosis of influenza from Nasopharyngeal swab specimens and should not be used as a sole basis for treatment. Nasal washings and aspirates are unacceptable for Xpert Xpress SARS-CoV-2/FLU/RSV testing.  Fact Sheet for Patients: EntrepreneurPulse.com.au  Fact Sheet for Healthcare Providers: IncredibleEmployment.be  This test is not yet approved or cleared by the [place] FDA and has been authorized for detection and/or diagnosis of SARS-CoV-2 by FDA under an Emergency Use Authorization (EUA). This EUA will remain in effect (meaning this test can be used) for the duration of the COVID-19 declaration under Section 564(b)(1) of the Act, 21 U.S.C. section 360bbb-3(b)(1), unless the authorization is terminated or revoked.  Performed at [facility], [address] [place], [place] [address]     Coagulation Studies: No results for input(s): LABPROT, INR in the last 72 hours.  Urinalysis: No results for input(s): COLORURINE, LABSPEC, PHURINE, GLUCOSEU, HGBUR, BILIRUBINUR, KETONESUR, PROTEINUR, UROBILINOGEN, NITRITE, LEUKOCYTESUR in the last 72 hours.  Invalid input(s): APPERANCEUR     Imaging: No results found.   Medications:    sodium chloride     sodium chloride       amLODipine  10 mg Oral QPM   amoxicillin-clavulanate  1 tablet Oral Q12H   calcium acetate  667 mg Oral TID WC   carvedilol   12.5 mg Oral QPM   Chlorhexidine Gluconate Cloth  6 each Topical Q0600   sodium chloride, sodium chloride, acetaminophen, alteplase, heparin, lidocaine (PF), lidocaine-prilocaine, oxyCODONE, pentafluoroprop-tetrafluoroeth, tiZANidine  Assessment/ Plan:  66 y.o. female with past medical history significant for ESRD on HD TTS, diabetes type 2, hypertension presented to the hospital seeking long term placement.   ESRD on hemodialysis on TTS.   Will receive dialysis later today. Monitoring discharge plan to determine outpatient needs  Hypertension. Continue amlodipine and carvedilol.  Blood pressure stable, 129/64  Anemia of chronic disease continue to monitor H&H latest Will recheck labs today  Secondary hyperparathyroidism.  Calcium within acceptable range, will recheck labs today  Infected dog bite of hand including left finger. Remains on oral antibiotics.    LOS: 0 Katie [name] 5/23/202311:29 AM

## 2021-10-03 NOTE — ED Notes (Signed)
Patient signed dialysis consent at this time.

## 2021-10-03 NOTE — ED Notes (Signed)
Tech assisted pt with bedside toilet

## 2021-10-03 NOTE — Progress Notes (Signed)
Hemodialysis Post Treatment Note   Date: Oct 02, 2021   Access: Right CVC    Treatment Time: 3 Hours   UF Removed: 2000 ml   Next Scheduled Treatment: 10/04/21   NOTE: Patient presents for 3-hour hemodialysis treatment. Experience difficulty with CVC flushing and pulling, When treatment started dialysis machine alarming high pressure, line flushed with NS, lines reverse with no improvement. Colon Flattery, NP made aware and ordered to give cathflo and let dwell for at least an hour. Treatment was terminated and cathflo dwell. Patient was able to complete full 3 hours of dialysis after treatment was resumed. Patient stable, transferred back to ED, report given to Tor Netters, RN.

## 2021-10-04 MED ORDER — OXYCODONE-ACETAMINOPHEN 5-325 MG PO TABS
1.0000 | ORAL_TABLET | Freq: Four times a day (QID) | ORAL | Status: DC | PRN
  Administered 2021-10-04 – 2021-11-27 (×109): 1 via ORAL
  Filled 2021-10-04 (×113): qty 1

## 2021-10-04 NOTE — TOC Progression Note (Signed)
Transition of Care [facility]) - Progression Note    Patient Details  Name: Katie [name] MRN: [id number] Date of Birth: 06-May-1956  Transition of Care [facility]) CM/SW Contact  Shelbie Hutching, RN Phone Number: 10/04/2021, 1:33 PM  Clinical Narrative:    Patient has 2 bed offers and another 2 pending offers all in [place].  Reached out to [employer] with [facility] who will be looking for a chair time in [place] as patient is currently at [facility] in [place].     Expected Discharge Plan: Skilled Nursing Facility Barriers to Discharge: SNF Pending bed offer  Expected Discharge Plan and Services Expected Discharge Plan: [place]   Discharge Planning Services: CM Consult Post Acute Care Choice: [place] Living arrangements for the past 2 months: Single Family Home                 DME Arranged: N/A DME Agency: NA       HH Arranged: NA HH Agency: NA         Social Determinants of Health (SDOH) Interventions    Readmission Risk Interventions    09/27/2021    1:44 PM 09/26/2021   12:46 PM 03/08/2021    2:49 PM  Readmission Risk Prevention Plan  Transportation Screening  Complete Complete  Palliative Care Screening Not Applicable    Medication Review (RN Care Manager)  Complete Complete  HRI or Home Care Consult   Complete  SW Recovery Care/Counseling Consult  Complete   Palliative Care Screening  Not Applicable   [place]  Not Applicable Complete

## 2021-10-04 NOTE — ED Provider Notes (Signed)
Today's Vitals   10/04/21 0104 10/04/21 0200 10/04/21 0205 10/04/21 0555  BP:  111/63  104/85  Pulse:  72  74  Resp:  16  18  Temp:    98.1 F (36.7 C)  TempSrc:    Oral  SpO2:  99%  100%  Weight:      Height:      PainSc: 2   Asleep    Body mass index is 28.82 kg/m.   No acute events overnight.  Awaiting social work disposition.   Shenay Torti, Delice Bison, DO 10/04/21 [phone]

## 2021-10-04 NOTE — ED Notes (Signed)
Pt transferred onto hospital for comfort.

## 2021-10-04 NOTE — ED Notes (Signed)
Pt up to restroom with 1 person assist. Socks changed into yellow hospital socks.

## 2021-10-04 NOTE — ED Notes (Signed)
Breakfast tray given. Pt repositioned in bed for breakfast. Pt c/o pain and requesting pain medication. Pt is A&Ox4 and NAD.

## 2021-10-05 LAB — HEPATITIS B SURFACE ANTIBODY, QUANTITATIVE: Hep B S AB Quant (Post): <3.1 m[IU]/mL — ABNORMAL LOW (ref >9.9)

## 2021-10-05 MED ORDER — HEPARIN SODIUM (PORCINE) 1000 UNIT/ML IJ SOLN
INTRAMUSCULAR | Status: AC
  Administered 2021-10-05: 4200 [IU]
  Filled 2021-10-05: qty 10

## 2021-10-05 NOTE — TOC Progression Note (Signed)
Transition of Care [facility]) - Progression Note    Patient Details  Name: Katie [name] MRN: [id number] Date of Birth: 14-May-1956  [facility]) CM/SW Contact  Shelbie Hutching, RN Phone Number: 10/05/2021, 7:00 PM  Clinical Narrative:    Patient presented with bed offers this afternoon, she [facility].  [name] with [facility] is looking for dialysis placement in [place]- as soon as placement is found patient will be ready to discharge.     Expected Discharge Plan: Skilled Nursing Facility Barriers to Discharge: SNF Pending bed offer  Expected Discharge Plan and Services Expected Discharge Plan: [place]   Discharge Planning Services: CM Consult Post Acute Care Choice: [place] Living arrangements for the past 2 months: Single Family Home                 DME Arranged: N/A DME Agency: NA       HH Arranged: NA HH Agency: NA         Social Determinants of Health (SDOH) Interventions    Readmission Risk Interventions    09/27/2021    1:44 PM 09/26/2021   12:46 PM 03/08/2021    2:49 PM  Readmission Risk Prevention Plan  Transportation Screening  Complete Complete  Palliative Care Screening Not Applicable    Medication Review (RN Care Manager)  Complete Complete  HRI or Home Care Consult   Complete  SW Recovery Care/Counseling Consult  Complete   Palliative Care Screening  Not Applicable   [place]  Not Applicable Complete

## 2021-10-05 NOTE — ED Notes (Signed)
Pt given meal tray.

## 2021-10-05 NOTE — Progress Notes (Signed)
   10/03/21 1536 10/03/21 1545 10/03/21 1600  Vitals  BP  --  (!) 153/86 118/69  MAP (mmHg)  --  105 84  Pulse Rate 73 74 69  ECG Heart Rate 73 73 69  Resp 12 14 12   During Hemodialysis Assessment  Blood Flow Rate (mL/min) 350 mL/min 350 mL/min 350 mL/min  Arterial Pressure (mmHg) -140 mmHg -210 mmHg -190 mmHg  Venous Pressure (mmHg) 250 mmHg 250 mmHg 260 mmHg  Transmembrane Pressure (mmHg) 80 mmHg 80 mmHg 80 mmHg  Ultrafiltration Rate (mL/min) 830 mL/min 830 mL/min 830 mL/min  Dialysate Flow Rate (mL/min) 500 ml/min 500 ml/min 500 ml/min  Conductivity: Machine  13.6 13.6 13.8  HD Safety Checks Performed Yes Yes Yes  Intra-Hemodialysis Comments Tx initiated Progressing as prescribed;Tolerated well Progressing as prescribed;Tolerated well    10/03/21 1615 10/03/21 1630 10/03/21 1645  Vitals  BP 136/76 (!) 142/78 (!) 147/86  MAP (mmHg) 93 96 103  Pulse Rate 70 69 69  ECG Heart Rate 70 70 69  Resp 10 11 11   During Hemodialysis Assessment  Blood Flow Rate (mL/min) 350 mL/min 350 mL/min 350 mL/min  Arterial Pressure (mmHg) -180 mmHg -160 mmHg -160 mmHg  Venous Pressure (mmHg) 260 mmHg 270 mmHg 260 mmHg  Transmembrane Pressure (mmHg) 80 mmHg 80 mmHg 80 mmHg  Ultrafiltration Rate (mL/min) 830 mL/min 830 mL/min 830 mL/min  Dialysate Flow Rate (mL/min) 500 ml/min 500 ml/min 500 ml/min  Conductivity: Machine  13.8 13.8 13.8  HD Safety Checks Performed Yes Yes Yes  Intra-Hemodialysis Comments Progressing as prescribed;Tolerated well Progressing as prescribed;Tolerated well Progressing as prescribed;Tolerated well    10/03/21 1700 10/03/21 1715 10/03/21 1730  Vitals  BP (!) 145/78 130/75 130/81  MAP (mmHg) 99 93 95  Pulse Rate 67 68 75  ECG Heart Rate 68 69 76  Resp 10 12 15   During Hemodialysis Assessment  Blood Flow Rate (mL/min) 350 mL/min 350 mL/min 350 mL/min  Arterial Pressure (mmHg) -160 mmHg -170 mmHg -170 mmHg  Venous Pressure (mmHg) 260 mmHg 250 mmHg 250 mmHg   Transmembrane Pressure (mmHg) 80 mmHg 80 mmHg 80 mmHg  Ultrafiltration Rate (mL/min) 830 mL/min 830 mL/min 830 mL/min  Dialysate Flow Rate (mL/min) 500 ml/min 500 ml/min 500 ml/min  Conductivity: Machine  13.8 13.8 13.8  HD Safety Checks Performed Yes Yes Yes  Intra-Hemodialysis Comments Progressing as prescribed;Tolerated well Progressing as prescribed;Tolerated well Progressing as prescribed;Tolerated well    10/03/21 1745  Vitals  BP 119/79  MAP (mmHg) 92  Pulse Rate 68  ECG Heart Rate 68  Resp 12  During Hemodialysis Assessment  Blood Flow Rate (mL/min) 350 mL/min  Arterial Pressure (mmHg) -170 mmHg  Venous Pressure (mmHg) 250 mmHg  Transmembrane Pressure (mmHg) 80 mmHg  Ultrafiltration Rate (mL/min) 830 mL/min  Dialysate Flow Rate (mL/min) 500 ml/min  Conductivity: Machine  13.8  HD Safety Checks Performed Yes  Intra-Hemodialysis Comments Progressing as prescribed;Tolerated well

## 2021-10-05 NOTE — Progress Notes (Signed)
   10/03/21 1800 10/03/21 1815 10/03/21 1830  Vitals  BP 129/79 136/74 (!) 133/114  MAP (mmHg) 94 91 122  Pulse Rate 70 69 77  ECG Heart Rate 72 70 77  Resp 12 13 20   During Hemodialysis Assessment  Blood Flow Rate (mL/min) 350 mL/min 350 mL/min 350 mL/min  Arterial Pressure (mmHg) -180 mmHg -170 mmHg -170 mmHg  Venous Pressure (mmHg) 250 mmHg 250 mmHg 250 mmHg  Transmembrane Pressure (mmHg) 60 mmHg 50 mmHg 40 mmHg  Ultrafiltration Rate (mL/min) 830 mL/min 830 mL/min 830 mL/min  Dialysate Flow Rate (mL/min) 500 ml/min 500 ml/min 500 ml/min  Conductivity: Machine  13.8 13.8 13.8  HD Safety Checks Performed Yes Yes Yes  Intra-Hemodialysis Comments Progressing as prescribed;Tolerated well Progressing as prescribed;Tolerated well Progressing as prescribed;Tolerated well    10/03/21 1831 10/03/21 1839  Vitals  BP 105/74  --   MAP (mmHg) 81  --   Pulse Rate 76  --   ECG Heart Rate 77  --   Resp 19  --   During Hemodialysis Assessment  Blood Flow Rate (mL/min)  --   --   Arterial Pressure (mmHg)  --   --   Venous Pressure (mmHg)  --   --   Transmembrane Pressure (mmHg)  --   --   Ultrafiltration Rate (mL/min)  --   --   Dialysate Flow Rate (mL/min)  --   --   Conductivity: Machine   --   --   HD Safety Checks Performed  --   --   Intra-Hemodialysis Comments  --  Tx completed

## 2021-10-05 NOTE — Progress Notes (Signed)
Hemodialysis Post Treatment Note:  Tx date:10/05/2021 Tx time: 3 hours Access: right CVC UF Removed: 566ml  Note: HD  treatment completed. Tolerated well. No complications. Post HD BP  129/59 map 75

## 2021-10-05 NOTE — ED Provider Notes (Signed)
-----------------------------------------   2:53 AM on 10/05/2021 -----------------------------------------   Blood pressure 126/67, pulse 77, temperature 97.6 F (36.4 C), resp. rate 19, height 1.676 m (5\' 6" ), weight 81 kg, SpO2 94 %.  The patient is calm and cooperative at this time, currently sleeping.  There have been no acute events since the last update.  Awaiting disposition plan from [facility] team.   Hinda Kehr, MD 10/05/21 [phone]

## 2021-10-05 NOTE — ED Notes (Signed)
Assisted pt to commode with 2 crutches and assistance.  Pt provided fresh mesh panties and sanitary pad.  Pt asking for pain meds and dressing change on her upper arm.  RN made aware

## 2021-10-05 NOTE — Progress Notes (Signed)
[facility]  ROUNDING NOTE   Subjective:   Patient is a 66 year old female with past medical history of ESRD with hemodialysis TTS, diabetes mellitus, hypertension to the hospital seeking long term placement. She was recently admitted and received antibiotics for a dog bite.  She was discharged with a prescription for home antibiotics.  She states her caregivers were unable to retrieve antibiotics.  She states that there was an argument and that she is here seeking long-term placement.  Patient receives outpatient dialysis treatments at [facility] on a TTS schedule.  Supervised by Dr. Holley Raring.    Update Patient was seen and evaluated during dialysis   HEMODIALYSIS FLOWSHEET:  Blood Flow Rate (mL/min): 300 mL/min Arterial Pressure (mmHg): -120 mmHg Venous Pressure (mmHg): 260 mmHg Transmembrane Pressure (mmHg): 70 mmHg Ultrafiltration Rate (mL/min): 330 mL/min Dialysate Flow Rate (mL/min): 500 ml/min Conductivity: Machine : 13.6 Conductivity: Machine : 13.6 Dialysis Fluid Bolus: Normal Saline Bolus Amount (mL): 250 mL  Voices concerns about discharge planning Denies pain and discomfort Tolerating meals   Objective:  Vital signs in last 24 hours:  Temp:  [97.6 F (36.4 C)-99.2 F (37.3 C)] 98.5 F (36.9 C) (05/25 0930) Pulse Rate:  [63-77] 64 (05/25 1230) Resp:  [10-19] 12 (05/25 1230) BP: (92-126)/(45-67) 110/58 (05/25 1230) SpO2:  [94 %-99 %] 97 % (05/25 1230) Weight:  [80.1 kg] 80.1 kg (05/25 0930)  Weight change:  Filed Weights   10/03/21 1338 10/03/21 1852 10/05/21 0930  Weight: 83.4 kg 81 kg 80.1 kg    Intake/Output: No intake/output data recorded.   Intake/Output this shift:  No intake/output data recorded.  Physical Exam: General: No acute distress  Head: Normocephalic, atraumatic. Moist oral mucosal membranes  Eyes: Anicteric  Lungs:  Clear to auscultation, normal effort  Heart: S1S2 no rubs  Abdomen:  Soft, nontender, bowel  sounds present  Extremities: No lower extremity edema or cyanosis.  Left third finger edema and erythema  Neurologic: Awake, alert, following commands  Skin: No acute rash  Access: R Permacath.  L AVG    Basic Metabolic Panel: Recent Labs  Lab 09/30/21 1208 10/03/21 1728  NA 138 137  K 3.4* 3.2*  CL 95* 98  CO2 30 29  GLUCOSE 98 101*  BUN 16 14  CREATININE 2.86* 3.05*  CALCIUM 8.4* 8.5*  PHOS  --  2.5     Liver Function Tests: Recent Labs  Lab 10/03/21 1728  ALBUMIN 2.9*    No results for input(s): LIPASE, AMYLASE in the last 168 hours.  No results for input(s): AMMONIA in the last 168 hours.  CBC: Recent Labs  Lab 09/30/21 1208 10/03/21 1728  WBC 7.2 6.7  HGB 11.5* 10.9*  HCT 37.4 35.4*  MCV 97.7 94.9  PLT 206 169     Cardiac Enzymes: No results for input(s): CKTOTAL, CKMB, CKMBINDEX, TROPONINI in the last 168 hours.  BNP: Invalid input(s): POCBNP  CBG: No results for input(s): GLUCAP in the last 168 hours.   Microbiology: Results for orders placed or performed during the hospital encounter of 09/30/21  Resp Panel by RT-PCR (Flu A&B, Covid) Nasopharyngeal Swab     Status: None   Collection Time: 09/30/21  9:34 AM   Specimen: Nasopharyngeal Swab; Nasopharyngeal(NP) swabs in vial transport medium  Result Value Ref Range Status   SARS Coronavirus 2 by RT PCR NEGATIVE NEGATIVE Final    Comment: (NOTE) SARS-CoV-2 target nucleic acids are NOT DETECTED.  The SARS-CoV-2 RNA is generally detectable in upper respiratory  specimens during the acute phase of infection. The lowest concentration of SARS-CoV-2 viral copies this assay can detect is 138 copies/mL. A negative result does not preclude SARS-Cov-2 infection and should not be used as the sole basis for treatment or other patient management decisions. A negative result may occur with  improper specimen collection/handling, submission of specimen other than nasopharyngeal swab, presence of viral  mutation(s) within the areas targeted by this assay, and inadequate number of viral copies(<138 copies/mL). A negative result must be combined with clinical observations, patient history, and epidemiological information. The expected result is Negative.  Fact Sheet for Patients:  EntrepreneurPulse.com.au  Fact Sheet for Healthcare Providers:  IncredibleEmployment.be  This test is no t yet approved or cleared by the [place] FDA and  has been authorized for detection and/or diagnosis of SARS-CoV-2 by FDA under an Emergency Use Authorization (EUA). This EUA will remain  in effect (meaning this test can be used) for the duration of the COVID-19 declaration under Section 564(b)(1) of the Act, 21 U.S.C.section 360bbb-3(b)(1), unless the authorization is terminated  or revoked sooner.       Influenza A by PCR NEGATIVE NEGATIVE Final   Influenza B by PCR NEGATIVE NEGATIVE Final    Comment: (NOTE) The Xpert Xpress SARS-CoV-2/FLU/RSV plus assay is intended as an aid in the diagnosis of influenza from Nasopharyngeal swab specimens and should not be used as a sole basis for treatment. Nasal washings and aspirates are unacceptable for Xpert Xpress SARS-CoV-2/FLU/RSV testing.  Fact Sheet for Patients: EntrepreneurPulse.com.au  Fact Sheet for Healthcare Providers: IncredibleEmployment.be  This test is not yet approved or cleared by the [place] FDA and has been authorized for detection and/or diagnosis of SARS-CoV-2 by FDA under an Emergency Use Authorization (EUA). This EUA will remain in effect (meaning this test can be used) for the duration of the COVID-19 declaration under Section 564(b)(1) of the Act, 21 U.S.C. section 360bbb-3(b)(1), unless the authorization is terminated or revoked.  Performed at [facility], [address] [place], [place] [address]     Coagulation Studies: No  results for input(s): LABPROT, INR in the last 72 hours.  Urinalysis: No results for input(s): COLORURINE, LABSPEC, PHURINE, GLUCOSEU, HGBUR, BILIRUBINUR, KETONESUR, PROTEINUR, UROBILINOGEN, NITRITE, LEUKOCYTESUR in the last 72 hours.  Invalid input(s): APPERANCEUR     Imaging: No results found.   Medications:       amLODipine  10 mg Oral QPM   amoxicillin-clavulanate  1 tablet Oral Q12H   calcium acetate  667 mg Oral TID WC   carvedilol  12.5 mg Oral QPM   Chlorhexidine Gluconate Cloth  6 each Topical Q0600   heparin sodium (porcine)       acetaminophen, oxyCODONE-acetaminophen, tiZANidine  Assessment/ Plan:  66 y.o. female with past medical history significant for ESRD on HD TTS, diabetes type 2, hypertension presented to the hospital seeking long term placement.   ESRD on hemodialysis on TTS.   Receiving dialysis today, UF goal 0.5L as tolerated. Next treatment scheduled for Saturday. Discharge planning to include long term care. Monitoring placement to determine outpatient needs.   Hypertension. Continue amlodipine and carvedilol.  Blood pressure 112/67 during dialysis  Anemia of chronic disease continue to monitor H&H latest   Secondary hyperparathyroidism.  Will monitor bone minerals during this admission  Infected dog bite of hand including left finger. Remains on oral antibiotics.    LOS: 0 Ledford Goodson 5/25/202312:43 PM

## 2021-10-05 NOTE — ED Notes (Signed)
Pt helped to restroom. Pt ambulated on own using pts 2 canes. Pt ambulated back to bed and hooked back up to monitor. Writer turned channel to [employer] per request from pt. Lights turned off per request from pt as well. No other needs at this time.

## 2021-10-05 NOTE — Progress Notes (Signed)
Working on clinic transfer to [facility] for SNF placement.

## 2021-10-06 MED ORDER — ONDANSETRON HCL 4 MG/2ML IJ SOLN
4.0000 mg | Freq: Once | INTRAMUSCULAR | Status: AC
  Administered 2021-10-24: 4 mg via INTRAVENOUS
  Filled 2021-10-06 (×2): qty 2

## 2021-10-06 MED ORDER — ONDANSETRON 4 MG PO TBDP
ORAL_TABLET | ORAL | Status: AC
  Administered 2021-10-06: 4 mg
  Filled 2021-10-06: qty 1

## 2021-10-06 NOTE — ED Notes (Signed)
Assisted pt to bedside bathroom. Pt was able to ambulate with stand by assist using her two canes. Pt was also provided with clean sheets. Pt refused washing up stating she had already done that yesterday.   Pt c/o ongoing left arm pain rating 7/10. Was given PRN oxy for pain

## 2021-10-06 NOTE — Progress Notes (Signed)
Awaiting clinical acceptance and financial clearance.

## 2021-10-06 NOTE — ED Notes (Signed)
Report received by previous RN. Pt currently resting comfortably in bed. Supplemental O2 2L via nasal cannula in place. Call light within reach.

## 2021-10-06 NOTE — ED Notes (Signed)
Pt given meal tray.

## 2021-10-06 NOTE — TOC Progression Note (Signed)
Transition of Care [facility]) - Progression Note    Patient Details  Name: Katie [name] MRN: [id number] Date of Birth: June 04, 1955  [facility]) CM/SW Contact  Shelbie Hutching, RN Phone Number: 10/06/2021, 3:10 PM  Clinical Narrative:    [name] with [facility] is still looking for Dialysis center to accept patient in [place].  [place] updated that no placement has been found yet.  Patient's insurance is a barrier.     Expected Discharge Plan: Skilled Nursing Facility Barriers to Discharge: SNF Pending bed offer  Expected Discharge Plan and Services Expected Discharge Plan: [place]   Discharge Planning Services: CM Consult Post Acute Care Choice: [place] Living arrangements for the past 2 months: Single Family Home                 DME Arranged: N/A DME Agency: NA       HH Arranged: NA HH Agency: NA         Social Determinants of Health (SDOH) Interventions    Readmission Risk Interventions    09/27/2021    1:44 PM 09/26/2021   12:46 PM 03/08/2021    2:49 PM  Readmission Risk Prevention Plan  Transportation Screening  Complete Complete  Palliative Care Screening Not Applicable    Medication Review (RN Care Manager)  Complete Complete  HRI or Home Care Consult   Complete  SW Recovery Care/Counseling Consult  Complete   Palliative Care Screening  Not Applicable   [place]  Not Applicable Complete

## 2021-10-07 LAB — CBC
HCT: 30.9 % — ABNORMAL LOW (ref 36.0–46.0)
Hemoglobin: 9.4 g/dL — ABNORMAL LOW (ref 12.0–15.0)
MCH: 30 pg (ref 26.0–34.0)
MCHC: 30.4 g/dL (ref 30.0–36.0)
MCV: 98.7 fL (ref 80.0–100.0)
Platelets: 206 10*3/uL (ref 150–400)
RBC: 3.13 MIL/uL — ABNORMAL LOW (ref 3.87–5.11)
RDW: 15.3 % (ref 11.5–15.5)
WBC: 7 10*3/uL (ref 4.0–10.5)
nRBC: 0 % (ref 0.0–0.2)

## 2021-10-07 NOTE — ED Notes (Signed)
Pt taken to dialysis by dialysis staff

## 2021-10-07 NOTE — Progress Notes (Addendum)
[facility]  ROUNDING NOTE   Subjective:   Patient is a 66 year old female with past medical history of ESRD with hemodialysis TTS, diabetes mellitus, hypertension to the hospital seeking long term placement. She was recently admitted and received antibiotics for a dog bite.  She was discharged with a prescription for home antibiotics.  She states her caregivers were unable to retrieve antibiotics.  She states that there was an argument and that she is here seeking long-term placement.  Patient receives outpatient dialysis treatments at [facility] on a TTS schedule.  Supervised by Dr. Holley Raring.    Update Patient was seen and evaluated during dialysis   HEMODIALYSIS FLOWSHEET:  Blood Flow Rate (mL/min): 400 mL/min Arterial Pressure (mmHg): -180 mmHg Venous Pressure (mmHg): 240 mmHg Transmembrane Pressure (mmHg): 50 mmHg Ultrafiltration Rate (mL/min): 330 mL/min Dialysate Flow Rate (mL/min): 500 ml/min Conductivity: Machine : 13.9 Conductivity: Machine : 13.9 Dialysis Fluid Bolus: Normal Saline Bolus Amount (mL): 250 mL  No complaints at this time.   Objective:  Vital signs in last 24 hours:  Temp:  [98.7 F (37.1 C)] 98.7 F (37.1 C) (05/27 0858) Pulse Rate:  [64-77] 64 (05/27 1015) Resp:  [10-18] 10 (05/27 1015) BP: (106-128)/(50-76) 109/54 (05/27 1015) SpO2:  [95 %-99 %] 98 % (05/27 1015) Weight:  [81.1 kg] 81.1 kg (05/27 0858)  Weight change:  Filed Weights   10/05/21 0930 10/05/21 1259 10/07/21 0858  Weight: 80.1 kg 79.4 kg 81.1 kg    Intake/Output: No intake/output data recorded.   Intake/Output this shift:  No intake/output data recorded.  Physical Exam: General: No acute distress  Head: Normocephalic, atraumatic. Moist oral mucosal membranes  Eyes: Anicteric  Lungs:  Clear to auscultation, normal effort  Heart: S1S2 no rubs  Abdomen:  Soft, nontender, bowel sounds present  Extremities: No lower extremity edema or cyanosis.    Neurologic: Awake, alert, following commands  Skin: No acute rash,  Left third finger sutures  Access: R Permacath.  L AVG    Basic Metabolic Panel: Recent Labs  Lab 09/30/21 1208 10/03/21 1728  NA 138 137  K 3.4* 3.2*  CL 95* 98  CO2 30 29  GLUCOSE 98 101*  BUN 16 14  CREATININE 2.86* 3.05*  CALCIUM 8.4* 8.5*  PHOS  --  2.5     Liver Function Tests: Recent Labs  Lab 10/03/21 1728  ALBUMIN 2.9*    No results for input(s): LIPASE, AMYLASE in the last 168 hours.  No results for input(s): AMMONIA in the last 168 hours.  CBC: Recent Labs  Lab 09/30/21 1208 10/03/21 1728  WBC 7.2 6.7  HGB 11.5* 10.9*  HCT 37.4 35.4*  MCV 97.7 94.9  PLT 206 169     Cardiac Enzymes: No results for input(s): CKTOTAL, CKMB, CKMBINDEX, TROPONINI in the last 168 hours.  BNP: Invalid input(s): POCBNP  CBG: No results for input(s): GLUCAP in the last 168 hours.   Microbiology: Results for orders placed or performed during the hospital encounter of 09/30/21  Resp Panel by RT-PCR (Flu A&B, Covid) Nasopharyngeal Swab     Status: None   Collection Time: 09/30/21  9:34 AM   Specimen: Nasopharyngeal Swab; Nasopharyngeal(NP) swabs in vial transport medium  Result Value Ref Range Status   SARS Coronavirus 2 by RT PCR NEGATIVE NEGATIVE Final    Comment: (NOTE) SARS-CoV-2 target nucleic acids are NOT DETECTED.  The SARS-CoV-2 RNA is generally detectable in upper respiratory specimens during the acute phase of infection. The lowest concentration  of SARS-CoV-2 viral copies this assay can detect is 138 copies/mL. A negative result does not preclude SARS-Cov-2 infection and should not be used as the sole basis for treatment or other patient management decisions. A negative result may occur with  improper specimen collection/handling, submission of specimen other than nasopharyngeal swab, presence of viral mutation(s) within the areas targeted by this assay, and inadequate number of  viral copies(<138 copies/mL). A negative result must be combined with clinical observations, patient history, and epidemiological information. The expected result is Negative.  Fact Sheet for Patients:  EntrepreneurPulse.com.au  Fact Sheet for Healthcare Providers:  IncredibleEmployment.be  This test is no t yet approved or cleared by the [place] FDA and  has been authorized for detection and/or diagnosis of SARS-CoV-2 by FDA under an Emergency Use Authorization (EUA). This EUA will remain  in effect (meaning this test can be used) for the duration of the COVID-19 declaration under Section 564(b)(1) of the Act, 21 U.S.C.section 360bbb-3(b)(1), unless the authorization is terminated  or revoked sooner.       Influenza A by PCR NEGATIVE NEGATIVE Final   Influenza B by PCR NEGATIVE NEGATIVE Final    Comment: (NOTE) The Xpert Xpress SARS-CoV-2/FLU/RSV plus assay is intended as an aid in the diagnosis of influenza from Nasopharyngeal swab specimens and should not be used as a sole basis for treatment. Nasal washings and aspirates are unacceptable for Xpert Xpress SARS-CoV-2/FLU/RSV testing.  Fact Sheet for Patients: EntrepreneurPulse.com.au  Fact Sheet for Healthcare Providers: IncredibleEmployment.be  This test is not yet approved or cleared by the [place] FDA and has been authorized for detection and/or diagnosis of SARS-CoV-2 by FDA under an Emergency Use Authorization (EUA). This EUA will remain in effect (meaning this test can be used) for the duration of the COVID-19 declaration under Section 564(b)(1) of the Act, 21 U.S.C. section 360bbb-3(b)(1), unless the authorization is terminated or revoked.  Performed at [facility], [address] [place], [place] [address]     Coagulation Studies:   Urinalysis: No results for input(s): COLORURINE, LABSPEC, PHURINE, GLUCOSEU,  HGBUR, BILIRUBINUR, KETONESUR, PROTEINUR, UROBILINOGEN, NITRITE, LEUKOCYTESUR in the last 72 hours.  Invalid input(s): APPERANCEUR     Imaging: No results found.   Medications:       amLODipine  10 mg Oral QPM   amoxicillin-clavulanate  1 tablet Oral Q12H   calcium acetate  667 mg Oral TID WC   carvedilol  12.5 mg Oral QPM   Chlorhexidine Gluconate Cloth  6 each Topical Q0600   ondansetron (ZOFRAN) IV  4 mg Intravenous Once   acetaminophen, oxyCODONE-acetaminophen, tiZANidine  Assessment/ Plan:  66 y.o. female with past medical history significant for ESRD on HD TTS, diabetes type 2, hypertension presented to the hospital seeking long term placement.   Infected dog bite of hand including left finger. Remains on oral antibiotics. Homelessness (Z 59.0) Has been accepted to [facility]. Patient is currently awaiting outpatient placement.  Dialysis coordinator aware and clinic search in progress.   ESRD on hemodialysis on TTS. Uremia R39.2  No outpatient dialysis unit available. HD via ER to prevent uremia. Currently receiving hemodialysis.  UF goal 1-1.5 L as tolerated.  Next treatment scheduled for Tuesday.    Hypertension. Continue amlodipine and carvedilol.  Blood pressure 107/53  Anemia of chronic disease.  Hgb 9.4. at goal.   Secondary hyperparathyroidism.  We will check labs in a.m.   LOS: 0 Katie [name] 5/27/202310:29 AM   Patient was examined  and evaluated with [facility] Flattery, NP.  Plan of care was formulated and discussed with patient as well as NP.  I agree with the note as documented with edits.

## 2021-10-07 NOTE — ED Notes (Addendum)
Spoke to Dialysis. Will be preparing transport at hangup

## 2021-10-07 NOTE — ED Provider Notes (Signed)
-----------------------------------------   5:24 AM on 10/07/2021 -----------------------------------------   Blood pressure 117/64, pulse 74, temperature 98.1 F (36.7 C), temperature source Oral, resp. rate 19, height 5\' 6"  (1.676 m), weight 79.4 kg, SpO2 99 %.  The patient is calm and cooperative at this time.  There have been no acute events since the last update.  Awaiting disposition plan from Social Work team.   Paulette Blanch, MD 10/07/21 [phone]

## 2021-10-07 NOTE — Progress Notes (Signed)
Hemodialysis Post Treatment Note  Oct 07, 2021  Access: RIJ CVC  Treatment Time Scheduled: 3 hrs.   UF Removed: 0.5-liter   BFR: 400  Next Scheduled Treatment: 10/10/2021  Note:   Patient presents for treatment without concern, RIJ CVC intact, no signs of infection. Patient tolerates treatment well, no interdialytic effects noted. The prescribed BFR maintained throughout treatment, targeted UF achieved, with 0.5-liter fluid removal. Patient requested oxygen during Tx for comfort, although, SpO2 >95% on RA. Patient remains inpatient awaiting long term placement.   No meds given this session, nor blood drawn. Patient transported to ED, receiving nurse given report.

## 2021-10-07 NOTE — ED Notes (Signed)
Pt eating breakfast 

## 2021-10-08 LAB — RENAL FUNCTION PANEL
Albumin: 2.9 g/dL — ABNORMAL LOW (ref 3.5–5.0)
Anion gap: 10 (ref 5–15)
BUN: 24 mg/dL — ABNORMAL HIGH (ref 8–23)
CO2: 31 mmol/L (ref 22–32)
Calcium: 9.2 mg/dL (ref 8.9–10.3)
Chloride: 96 mmol/L — ABNORMAL LOW (ref 98–111)
Creatinine, Ser: 4.82 mg/dL — ABNORMAL HIGH (ref 0.44–1.00)
GFR, Estimated: 10 mL/min — ABNORMAL LOW (ref >60)
Glucose, Bld: 121 mg/dL — ABNORMAL HIGH (ref 70–99)
Phosphorus: 4.3 mg/dL (ref 2.5–4.6)
Potassium: 5.1 mmol/L (ref 3.5–5.1)
Sodium: 137 mmol/L (ref 135–145)

## 2021-10-08 NOTE — ED Notes (Signed)
Received report from Reina RN. 

## 2021-10-08 NOTE — ED Notes (Signed)
Pt is sleeping

## 2021-10-08 NOTE — ED Notes (Signed)
Pt is alert and oriented, speaking in full sentences, breathing easy and unlabored. Call bell at the bedside, advise pt to call for help.

## 2021-10-08 NOTE — ED Provider Notes (Signed)
Vitals:   10/08/21 0807 10/08/21 0831  BP:  126/60  Pulse:  82  Resp:  16  Temp: 97.8 F (36.6 C)   SpO2:  95%    Patient underwent dialysis yesterday without any issues.  She is pending social work placement.   Rada Hay, MD 10/08/21 1444

## 2021-10-08 NOTE — ED Notes (Signed)
Helped pt to toilet and given sandwich tray

## 2021-10-08 NOTE — ED Notes (Signed)
Pt continues to sleep.

## 2021-10-08 NOTE — ED Notes (Signed)
Helped pt up to toilet and provided phone and dialed number for pt to call friend.

## 2021-10-08 NOTE — ED Notes (Signed)
TOC Placement / ESRD to be Est.

## 2021-10-08 NOTE — ED Notes (Signed)
Sent med message for augmentin.

## 2021-10-08 NOTE — ED Notes (Signed)
Pt eating breakfast. Pt states had in hospital dialysis yesterday. Pt requests PRN oxycodone once RN can bring.

## 2021-10-08 NOTE — ED Notes (Signed)
Pt continues to sleep. Lunch ray at bedside. Will administer Phoslo once pt awake and eating lunch.

## 2021-10-09 MED ORDER — DOCUSATE SODIUM 100 MG PO CAPS
100.0000 mg | ORAL_CAPSULE | Freq: Every day | ORAL | Status: DC
  Administered 2021-10-09 – 2021-10-18 (×9): 100 mg via ORAL
  Filled 2021-10-09 (×10): qty 1

## 2021-10-09 MED ORDER — DOCUSATE SODIUM 100 MG PO CAPS
100.0000 mg | ORAL_CAPSULE | Freq: Once | ORAL | Status: DC
  Filled 2021-10-09: qty 1

## 2021-10-09 NOTE — ED Notes (Signed)
TOC  PLACEMENT 

## 2021-10-09 NOTE — ED Notes (Signed)
Pt ambulates to the bathroom with the assistance of a cane and return to bed.

## 2021-10-09 NOTE — TOC Progression Note (Signed)
Transition of Care [facility]) - Progression Note    Patient Details  Name: Katie [name] MRN: [id number] Date of Birth: January 04, 1956  Transition of Care [facility]) CM/SW [place], LCSW Phone Number: 10/09/2021, 9:29 AM  Clinical Narrative:     [name] with [facility] is still looking for dialysis center to accept patient in [place]. No placement has been found yet.  [place] updated. Patient's insurance is a barrier.  Expected Discharge Plan: Skilled Nursing Facility Barriers to Discharge: SNF Pending bed offer  Expected Discharge Plan and Services Expected Discharge Plan: [place]   Discharge Planning Services: CM Consult Post Acute Care Choice: [place] Living arrangements for the past 2 months: Single Family Home                 DME Arranged: N/A DME Agency: NA       HH Arranged: NA HH Agency: NA         Social Determinants of Health (SDOH) Interventions    Readmission Risk Interventions    09/27/2021    1:44 PM 09/26/2021   12:46 PM 03/08/2021    2:49 PM  Readmission Risk Prevention Plan  Transportation Screening  Complete Complete  Palliative Care Screening Not Applicable    Medication Review (RN Care Manager)  Complete Complete  HRI or Home Care Consult   Complete  SW Recovery Care/Counseling Consult  Complete   Palliative Care Screening  Not Applicable   [place]  Not Applicable Complete

## 2021-10-09 NOTE — ED Notes (Signed)
Patient ambulated to bathroom with cane and assistance from this tech. Patient back in bed.

## 2021-10-09 NOTE — ED Provider Notes (Signed)
-----------------------------------------   7:39 AM on 10/09/2021 -----------------------------------------   Blood pressure (!) 154/75, pulse 78, temperature 97.8 F (36.6 C), temperature source Oral, resp. rate 16, height 1.676 m (5\' 6" ), weight 80.7 kg, SpO2 96 %.  The patient is calm and cooperative at this time.  There have been no acute events since the last update.  Awaiting disposition plan from [facility] team.   Hinda Kehr, MD 10/09/21 [phone]

## 2021-10-10 LAB — CBC
HCT: 37 % (ref 36.0–46.0)
Hemoglobin: 11.2 g/dL — ABNORMAL LOW (ref 12.0–15.0)
MCH: 29.8 pg (ref 26.0–34.0)
MCHC: 30.3 g/dL (ref 30.0–36.0)
MCV: 98.4 fL (ref 80.0–100.0)
Platelets: 239 10*3/uL (ref 150–400)
RBC: 3.76 MIL/uL — ABNORMAL LOW (ref 3.87–5.11)
RDW: 15.1 % (ref 11.5–15.5)
WBC: 8 10*3/uL (ref 4.0–10.5)
nRBC: 0 % (ref 0.0–0.2)

## 2021-10-10 LAB — RENAL FUNCTION PANEL
Albumin: 2.9 g/dL — ABNORMAL LOW (ref 3.5–5.0)
Anion gap: 11 (ref 5–15)
BUN: 44 mg/dL — ABNORMAL HIGH (ref 8–23)
CO2: 25 mmol/L (ref 22–32)
Calcium: 8.8 mg/dL — ABNORMAL LOW (ref 8.9–10.3)
Chloride: 97 mmol/L — ABNORMAL LOW (ref 98–111)
Creatinine, Ser: 7.06 mg/dL — ABNORMAL HIGH (ref 0.44–1.00)
GFR, Estimated: 6 mL/min — ABNORMAL LOW (ref >60)
Glucose, Bld: 96 mg/dL (ref 70–99)
Phosphorus: 5.2 mg/dL — ABNORMAL HIGH (ref 2.5–4.6)
Potassium: 5.4 mmol/L — ABNORMAL HIGH (ref 3.5–5.1)
Sodium: 133 mmol/L — ABNORMAL LOW (ref 135–145)

## 2021-10-10 MED ORDER — ONDANSETRON 4 MG PO TBDP
4.0000 mg | ORAL_TABLET | Freq: Three times a day (TID) | ORAL | Status: DC | PRN
  Administered 2021-10-10 – 2021-10-23 (×22): 4 mg via ORAL
  Filled 2021-10-10 (×22): qty 1

## 2021-10-10 MED ORDER — HEPARIN SODIUM (PORCINE) 1000 UNIT/ML IJ SOLN
INTRAMUSCULAR | Status: AC
  Filled 2021-10-10: qty 10

## 2021-10-10 MED ORDER — ONDANSETRON 4 MG PO TBDP
ORAL_TABLET | ORAL | Status: AC
  Filled 2021-10-10: qty 1

## 2021-10-10 NOTE — ED Notes (Signed)
Pt requested to be taken off monitor stating it hurts her and annoys her while she tries to eat.

## 2021-10-10 NOTE — ED Notes (Signed)
Pt given breakfast tray

## 2021-10-10 NOTE — ED Notes (Signed)
Pt was taken to the restroom at this time

## 2021-10-10 NOTE — ED Notes (Signed)
Dietary dropped off pt's lunch tray.

## 2021-10-10 NOTE — ED Notes (Addendum)
Pt requesting pain and nausea meds; pt denies nausea but states pain meds sometimes make her nauseous. States R arm chronic pain from doing any basic activity like using it to get to the bedside toilet. Called dietary who states they will send pt a tray soon.

## 2021-10-10 NOTE — Progress Notes (Signed)
Unfortunately, this patient is not able to be placed at a [facility] in [place]. Admissions stated that patient's managed Medicaid is not within network.

## 2021-10-10 NOTE — Progress Notes (Signed)
[facility]  ROUNDING NOTE   Subjective:   Patient is a 66 year old female with past medical history of ESRD with hemodialysis TTS, diabetes mellitus, hypertension to the hospital seeking long term placement. She was recently admitted and received antibiotics for a dog bite.  She was discharged with a prescription for home antibiotics.  She states her caregivers were unable to retrieve antibiotics.  She states that there was an argument and that she is here seeking long-term placement.  Patient receives outpatient dialysis treatments at [facility] on a TTS schedule.  Supervised by Dr. Holley Raring.    Update Patient was seen and evaluated during dialysis   HEMODIALYSIS FLOWSHEET:  Blood Flow Rate (mL/min): 400 mL/min Arterial Pressure (mmHg): -180 mmHg Venous Pressure (mmHg): 130 mmHg Transmembrane Pressure (mmHg): 50 mmHg Ultrafiltration Rate (mL/min): 330 mL/min Dialysate Flow Rate (mL/min): 500 ml/min Conductivity: 13.4 Conductivity: 13.4 Dialysis Fluid Bolus: Normal Saline Bolus Amount (mL): 250 mL  No complaints at this time.   Objective:  Vital signs in last 24 hours:  Temp:  [97.7 F (36.5 C)-98.7 F (37.1 C)] 97.7 F (36.5 C) (05/30 1227) Pulse Rate:  [64-84] 78 (05/30 1342) Resp:  [10-17] 14 (05/30 1245) BP: (117-167)/(52-74) 117/52 (05/30 1332) SpO2:  [94 %-100 %] 94 % (05/30 1340) Weight:  [85.5 kg-86 kg] 85.5 kg (05/30 1227)  Weight change:  Filed Weights   10/07/21 1200 10/10/21 0922 10/10/21 1227  Weight: 80.7 kg 86 kg 85.5 kg    Intake/Output: No intake/output data recorded.   Intake/Output this shift:  Total I/O In: -  Out: 1000 [Other:1000]  Physical Exam: General: No acute distress  Head: Normocephalic, atraumatic. Moist oral mucosal membranes  Eyes: Anicteric  Lungs:  Clear to auscultation, normal effort  Heart: S1S2 no rubs  Abdomen:  Soft, nontender, bowel sounds present  Extremities: No lower extremity edema or  cyanosis.   Neurologic: Awake, alert, following commands  Skin: No acute rash,  Left third finger sutures  Access: R Permacath.  L AVG    Basic Metabolic Panel: Recent Labs  Lab 10/03/21 1728 10/08/21 0806 10/10/21 0457  NA 137 137 133*  K 3.2* 5.1 5.4*  CL 98 96* 97*  CO2 29 31 25   GLUCOSE 101* 121* 96  BUN 14 24* 44*  CREATININE 3.05* 4.82* 7.06*  CALCIUM 8.5* 9.2 8.8*  PHOS 2.5 4.3 5.2*     Liver Function Tests: Recent Labs  Lab 10/03/21 1728 10/08/21 0806 10/10/21 0457  ALBUMIN 2.9* 2.9* 2.9*    No results for input(s): LIPASE, AMYLASE in the last 168 hours.  No results for input(s): AMMONIA in the last 168 hours.  CBC: Recent Labs  Lab 10/03/21 1728 10/07/21 1045 10/10/21 0457  WBC 6.7 7.0 8.0  HGB 10.9* 9.4* 11.2*  HCT 35.4* 30.9* 37.0  MCV 94.9 98.7 98.4  PLT 169 206 239     Cardiac Enzymes: No results for input(s): CKTOTAL, CKMB, CKMBINDEX, TROPONINI in the last 168 hours.  BNP: Invalid input(s): POCBNP  CBG: No results for input(s): GLUCAP in the last 168 hours.   Microbiology: Results for orders placed or performed during the hospital encounter of 09/30/21  Resp Panel by RT-PCR (Flu A&B, Covid) Nasopharyngeal Swab     Status: None   Collection Time: 09/30/21  9:34 AM   Specimen: Nasopharyngeal Swab; Nasopharyngeal(NP) swabs in vial transport medium  Result Value Ref Range Status   SARS Coronavirus 2 by RT PCR NEGATIVE NEGATIVE Final    Comment: (NOTE) SARS-CoV-2  target nucleic acids are NOT DETECTED.  The SARS-CoV-2 RNA is generally detectable in upper respiratory specimens during the acute phase of infection. The lowest concentration of SARS-CoV-2 viral copies this assay can detect is 138 copies/mL. A negative result does not preclude SARS-Cov-2 infection and should not be used as the sole basis for treatment or other patient management decisions. A negative result may occur with  improper specimen collection/handling, submission  of specimen other than nasopharyngeal swab, presence of viral mutation(s) within the areas targeted by this assay, and inadequate number of viral copies(<138 copies/mL). A negative result must be combined with clinical observations, patient history, and epidemiological information. The expected result is Negative.  Fact Sheet for Patients:  EntrepreneurPulse.com.au  Fact Sheet for Healthcare Providers:  IncredibleEmployment.be  This test is no t yet approved or cleared by the [place] FDA and  has been authorized for detection and/or diagnosis of SARS-CoV-2 by FDA under an Emergency Use Authorization (EUA). This EUA will remain  in effect (meaning this test can be used) for the duration of the COVID-19 declaration under Section 564(b)(1) of the Act, 21 U.S.C.section 360bbb-3(b)(1), unless the authorization is terminated  or revoked sooner.       Influenza A by PCR NEGATIVE NEGATIVE Final   Influenza B by PCR NEGATIVE NEGATIVE Final    Comment: (NOTE) The Xpert Xpress SARS-CoV-2/FLU/RSV plus assay is intended as an aid in the diagnosis of influenza from Nasopharyngeal swab specimens and should not be used as a sole basis for treatment. Nasal washings and aspirates are unacceptable for Xpert Xpress SARS-CoV-2/FLU/RSV testing.  Fact Sheet for Patients: EntrepreneurPulse.com.au  Fact Sheet for Healthcare Providers: IncredibleEmployment.be  This test is not yet approved or cleared by the [place] FDA and has been authorized for detection and/or diagnosis of SARS-CoV-2 by FDA under an Emergency Use Authorization (EUA). This EUA will remain in effect (meaning this test can be used) for the duration of the COVID-19 declaration under Section 564(b)(1) of the Act, 21 U.S.C. section 360bbb-3(b)(1), unless the authorization is terminated or revoked.  Performed at [facility], [address] [place], [place] [address]     Coagulation Studies:   Urinalysis: No results for input(s): COLORURINE, LABSPEC, PHURINE, GLUCOSEU, HGBUR, BILIRUBINUR, KETONESUR, PROTEINUR, UROBILINOGEN, NITRITE, LEUKOCYTESUR in the last 72 hours.  Invalid input(s): APPERANCEUR     Imaging: No results found.   Medications:       amLODipine  10 mg Oral QPM   amoxicillin-clavulanate  1 tablet Oral Q12H   calcium acetate  667 mg Oral TID WC   carvedilol  12.5 mg Oral QPM   Chlorhexidine Gluconate Cloth  6 each Topical Q0600   docusate sodium  100 mg Oral Once   docusate sodium  100 mg Oral Daily   heparin sodium (porcine)       ondansetron (ZOFRAN) IV  4 mg Intravenous Once   ondansetron       acetaminophen, ondansetron, oxyCODONE-acetaminophen, tiZANidine  Assessment/ Plan:  66 y.o. female with ESRD on HD TTS, diabetes type 2, hypertension presented to the hospital seeking long term placement.   Hyperkalemia- expected to correct with HD Homelessness (Z 59.0) Has been accepted to [facility]. Patient is currently awaiting outpatient placement.  Dialysis coordinator aware and clinic search in progress.  ESRD on hemodialysis on TTS. Uremia R39.2  No outpatient dialysis unit available. HD via ER to prevent uremia. Currently receiving hemodialysis.    Hypertension. Continue amlodipine and carvedilol.  Anemia of chronic disease.  Hgb 11.2. at goal.   Secondary hyperparathyroidism.   Lab Results  Component Value Date   PTH 70 (H) 05/12/2020   CALCIUM 8.8 (L) 10/10/2021   PHOS 5.2 (H) 10/10/2021    7. Infected dog bite of hand including left finger. Remains on oral antibiotics.     LOS: 0 Katie [name] 5/30/20234:15 PM

## 2021-10-10 NOTE — ED Notes (Signed)
Report given to Dialysis RN.

## 2021-10-10 NOTE — ED Notes (Signed)
Pt to room from dialysis; pt alert and calmly laying on stretcher.

## 2021-10-10 NOTE — ED Notes (Signed)
Clean chux and clean pillow placed on bed. Pt's hair brushed and pt repositioned back in bed. Pt resting comfortably.

## 2021-10-10 NOTE — ED Notes (Signed)
Pt up to toilet 

## 2021-10-10 NOTE — ED Notes (Signed)
TOC placement 

## 2021-10-10 NOTE — ED Notes (Signed)
Pt up to bedside toilet using both of her canes; states "it is easier" than using a walker. Pt requesting food.

## 2021-10-10 NOTE — ED Notes (Signed)
Attending notified of pt's request for oral nausea med. Awaiting reply.

## 2021-10-10 NOTE — ED Notes (Signed)
Pt Assisted up to toilet

## 2021-10-10 NOTE — Progress Notes (Signed)
Hemodialysis Post Treatment Notes  10 Oct 2021   Access: RIJ Catheter   UF Removed: .5 liters  BFR: 400  Treatment Time: 3 hrs.   Next Scheduled Treatment: 10/12/21  Note: Patient presents for treatment without concerns, RIJ CVC intact, no signs of infection, dressing changed. Patient declines to stand for weight although ambulatory, with assistive devices. Pt. undergoes treatment without incident, maintains prescribed BFR, achieves targeted UF removal of 0.5 liter. Patient counseled on eating foods high in potassium as she brought banana with her to treatment. No meds given nor labs drawn this session. Patient transported to ED, awaiting placement to rehab, report given to assigned nurse.

## 2021-10-11 NOTE — ED Notes (Signed)
Patient vomited after receiving Zofran. Whole intact pill noted in emesis.  New pill obtained and administered to patient.  Will continue to monitor

## 2021-10-11 NOTE — ED Notes (Signed)
Patient told by NT to keep pulse ox on at this time

## 2021-10-11 NOTE — ED Notes (Signed)
Pt helped to the restroom at this time 

## 2021-10-11 NOTE — TOC Progression Note (Signed)
Transition of Care [facility]) - Progression Note    Patient Details  Name: Katie [name] MRN: [id number] Date of Birth: 1955/08/28  [facility] of [facility]) CM/SW Contact  Shelbie Hutching, RN Phone Number: 10/11/2021, 3:19 PM  Clinical Narrative:    Felipa Evener back from [place] at [facility]- she would like to come and see patient tomorrow to evaluate.     Expected Discharge Plan: Skilled Nursing Facility Barriers to Discharge: SNF Pending bed offer  Expected Discharge Plan and Services Expected Discharge Plan: [place]   Discharge Planning Services: CM Consult Post Acute Care Choice: [place] Living arrangements for the past 2 months: Single Family Home                 DME Arranged: N/A DME Agency: NA       HH Arranged: NA HH Agency: NA         Social Determinants of Health (SDOH) Interventions    Readmission Risk Interventions    09/27/2021    1:44 PM 09/26/2021   12:46 PM 03/08/2021    2:49 PM  Readmission Risk Prevention Plan  Transportation Screening  Complete Complete  Palliative Care Screening Not Applicable    Medication Review (RN Care Manager)  Complete Complete  HRI or Home Care Consult   Complete  SW Recovery Care/Counseling Consult  Complete   Palliative Care Screening  Not Applicable   [place]  Not Applicable Complete

## 2021-10-11 NOTE — ED Notes (Signed)
Patient c/o nausea.  Provided with PRN medication

## 2021-10-11 NOTE — TOC Progression Note (Signed)
Transition of Care [facility]) - Progression Note    Patient Details  Name: Katie [name] MRN: [id number] Date of Birth: 04-23-1956  Transition of Care [facility]) CM/SW Contact  Shelbie Hutching, RN Phone Number: 10/11/2021, 9:18 AM  Clinical Narrative:    Dialysis Coordinator Thomes Dinning is unable to find dialysis center to accept patient in [place], the patient's insurance is not in network with [facility].  RNCM has reached out to [facility] in [place], there is a [facility] in [place] so if [facility] can accept hopefully we could get patient on their schedule.   Debbie with [facility] will look at the referral to see if they can accept.     Expected Discharge Plan: Skilled Nursing Facility Barriers to Discharge: SNF Pending bed offer  Expected Discharge Plan and Services Expected Discharge Plan: [place]   Discharge Planning Services: CM Consult Post Acute Care Choice: [place] Living arrangements for the past 2 months: Single Family Home                 DME Arranged: N/A DME Agency: NA       HH Arranged: NA HH Agency: NA         Social Determinants of Health (SDOH) Interventions    Readmission Risk Interventions    09/27/2021    1:44 PM 09/26/2021   12:46 PM 03/08/2021    2:49 PM  Readmission Risk Prevention Plan  Transportation Screening  Complete Complete  Palliative Care Screening Not Applicable    Medication Review (RN Care Manager)  Complete Complete  HRI or Home Care Consult   Complete  SW Recovery Care/Counseling Consult  Complete   Palliative Care Screening  Not Applicable   [place]  Not Applicable Complete

## 2021-10-11 NOTE — ED Notes (Signed)
Pt reports that her pain is increasing, will review prn med orders.

## 2021-10-11 NOTE — ED Notes (Signed)
Pt taken to the restroom at this time

## 2021-10-11 NOTE — ED Notes (Signed)
Assisted pt to the toilet

## 2021-10-12 ENCOUNTER — Non-Acute Institutional Stay: Payer: Medicaid Other

## 2021-10-12 LAB — CBC WITH DIFFERENTIAL/PLATELET
Abs Immature Granulocytes: 0.09 10*3/uL — ABNORMAL HIGH (ref 0.00–0.07)
Basophils Absolute: 0.1 10*3/uL (ref 0.0–0.1)
Basophils Relative: 1 %
Eosinophils Absolute: 0 10*3/uL (ref 0.0–0.5)
Eosinophils Relative: 0 %
HCT: 31.2 % — ABNORMAL LOW (ref 36.0–46.0)
Hemoglobin: 9.5 g/dL — ABNORMAL LOW (ref 12.0–15.0)
Immature Granulocytes: 1 %
Lymphocytes Relative: 9 %
Lymphs Abs: 1.3 10*3/uL (ref 0.7–4.0)
MCH: 29.8 pg (ref 26.0–34.0)
MCHC: 30.4 g/dL (ref 30.0–36.0)
MCV: 97.8 fL (ref 80.0–100.0)
Monocytes Absolute: 1.2 10*3/uL — ABNORMAL HIGH (ref 0.1–1.0)
Monocytes Relative: 9 %
Neutro Abs: 11.5 10*3/uL — ABNORMAL HIGH (ref 1.7–7.7)
Neutrophils Relative %: 80 %
Platelets: 248 10*3/uL (ref 150–400)
RBC: 3.19 MIL/uL — ABNORMAL LOW (ref 3.87–5.11)
RDW: 14.8 % (ref 11.5–15.5)
WBC: 14.2 10*3/uL — ABNORMAL HIGH (ref 4.0–10.5)
nRBC: 0 % (ref 0.0–0.2)

## 2021-10-12 LAB — RENAL FUNCTION PANEL
Albumin: 2.6 g/dL — ABNORMAL LOW (ref 3.5–5.0)
Anion gap: 9 (ref 5–15)
BUN: 33 mg/dL — ABNORMAL HIGH (ref 8–23)
CO2: 28 mmol/L (ref 22–32)
Calcium: 8.3 mg/dL — ABNORMAL LOW (ref 8.9–10.3)
Chloride: 97 mmol/L — ABNORMAL LOW (ref 98–111)
Creatinine, Ser: 6.3 mg/dL — ABNORMAL HIGH (ref 0.44–1.00)
GFR, Estimated: 7 mL/min — ABNORMAL LOW (ref >60)
Glucose, Bld: 172 mg/dL — ABNORMAL HIGH (ref 70–99)
Phosphorus: 4.4 mg/dL (ref 2.5–4.6)
Potassium: 5.3 mmol/L — ABNORMAL HIGH (ref 3.5–5.1)
Sodium: 134 mmol/L — ABNORMAL LOW (ref 135–145)

## 2021-10-12 MED ORDER — HEPARIN SODIUM (PORCINE) 1000 UNIT/ML IJ SOLN
INTRAMUSCULAR | Status: AC
  Administered 2021-10-12: 4200 [IU]
  Filled 2021-10-12: qty 10

## 2021-10-12 NOTE — ED Notes (Signed)
Patient c/o hand and shoulder pain.  Medicated per PRN order

## 2021-10-12 NOTE — TOC Progression Note (Signed)
Transition of Care [facility]) - Progression Note    Patient Details  Name: Katie [name] MRN: [id number] Date of Birth: 05-Jul-1955  Transition of Care [facility]) CM/SW Contact  Shelbie Hutching, RN Phone Number: 10/12/2021, 5:15 PM  Clinical Narrative:    Tanya with [facility] did come to the hospital to see patient and was ready and willing to accept patient.  She did not realize that the facility was no longer accepting managed Medicaid.  The administration at the facility said no to accepting the patient.  RNCM will follow up with [facility] again tomorrow.    Expected Discharge Plan: Skilled Nursing Facility Barriers to Discharge: SNF Pending bed offer  Expected Discharge Plan and Services Expected Discharge Plan: [place]   Discharge Planning Services: CM Consult Post Acute Care Choice: [place] Living arrangements for the past 2 months: Single Family Home                 DME Arranged: N/A DME Agency: NA       HH Arranged: NA HH Agency: NA         Social Determinants of Health (SDOH) Interventions    Readmission Risk Interventions    09/27/2021    1:44 PM 09/26/2021   12:46 PM 03/08/2021    2:49 PM  Readmission Risk Prevention Plan  Transportation Screening  Complete Complete  Palliative Care Screening Not Applicable    Medication Review (RN Care Manager)  Complete Complete  HRI or Home Care Consult   Complete  SW Recovery Care/Counseling Consult  Complete   Palliative Care Screening  Not Applicable   [place]  Not Applicable Complete

## 2021-10-12 NOTE — ED Notes (Signed)
Patient transported to dialysis at this time.

## 2021-10-12 NOTE — ED Notes (Signed)
Patient assisted to stand and ambulate to bedside commode.  Patient able to ambulate with stand by assist and two canes.  Patient's sheet changed and new blankets provided.  Given ginger ale per request

## 2021-10-12 NOTE — ED Notes (Signed)
Patient ate 75% breakfast tray 

## 2021-10-12 NOTE — Progress Notes (Signed)
Hemodialysis Post Treatment Note:  Tx date:10/12/2021 Tx time: 3.5 hours Access: right CVC UF Removed: 343 ml  Note:   09:08 HD started 12:04 Pt complained of head ache and pain on her shoulder. Pain med given Oxycodone-acetamenophen 5-325 mg given PO (see MAR) 12:15 pt complained of nausea. NP shantelle notified with order to turn UF off and give ondansetron tablet. med ordered to the pharmacy.  12:33 Ondansetron tablet 4 mg given PO  12:39 HD completed. No Hd complications. Post HD BP 117/52

## 2021-10-12 NOTE — Progress Notes (Signed)
[facility]  ROUNDING NOTE   Subjective:   Patient is a 66 year old female with past medical history of ESRD with hemodialysis TTS, diabetes mellitus, hypertension to the hospital seeking long term placement. She was recently admitted and received antibiotics for a dog bite.  She was discharged with a prescription for home antibiotics.  She states her caregivers were unable to retrieve antibiotics.  She states that there was an argument and that she is here seeking long-term placement.  Patient receives outpatient dialysis treatments at [facility] on a TTS schedule.  Supervised by Dr. Holley Raring.    Update Patient was seen and evaluated during dialysis   HEMODIALYSIS FLOWSHEET:  Blood Flow Rate (mL/min): 400 mL/min Arterial Pressure (mmHg): -180 mmHg Venous Pressure (mmHg): 120 mmHg Transmembrane Pressure (mmHg): 60 mmHg Ultrafiltration Rate (mL/min): 290 mL/min Dialysate Flow Rate (mL/min): 500 ml/min Conductivity: 13.6 Conductivity: 13.6 Dialysis Fluid Bolus: Normal Saline Bolus Amount (mL): 250 mL  No complaints at this time   Objective:  Vital signs in last 24 hours:  Temp:  [98.5 F (36.9 C)-98.6 F (37 C)] 98.6 F (37 C) (06/01 0858) Pulse Rate:  [73-83] 81 (06/01 1145) Resp:  [11-19] 11 (06/01 1145) BP: (100-131)/(52-72) 131/67 (06/01 1145) SpO2:  [97 %-100 %] 100 % (06/01 1145) Weight:  [81.2 kg] 81.2 kg (06/01 0858)  Weight change:  Filed Weights   10/10/21 0922 10/10/21 1227 10/12/21 0858  Weight: 86 kg 85.5 kg 81.2 kg    Intake/Output: I/O last 3 completed shifts: In: -  Out: 400 [Urine:400]   Intake/Output this shift:  No intake/output data recorded.  Physical Exam: General: No acute distress  Head: Normocephalic, atraumatic. Moist oral mucosal membranes  Eyes: Anicteric  Lungs:  Clear to auscultation, normal effort  Heart: S1S2 no rubs  Abdomen:  Soft, nontender, bowel sounds present  Extremities: No lower extremity edema or  cyanosis.   Neurologic: Awake, alert, following commands  Skin: No acute rash,  Left third finger sutures  Access: R Permacath.  L AVG    Basic Metabolic Panel: Recent Labs  Lab 10/08/21 0806 10/10/21 0457 10/12/21 0908  NA 137 133* 134*  K 5.1 5.4* 5.3*  CL 96* 97* 97*  CO2 31 25 28   GLUCOSE 121* 96 172*  BUN 24* 44* 33*  CREATININE 4.82* 7.06* 6.30*  CALCIUM 9.2 8.8* 8.3*  PHOS 4.3 5.2* 4.4     Liver Function Tests: Recent Labs  Lab 10/08/21 0806 10/10/21 0457 10/12/21 0908  ALBUMIN 2.9* 2.9* 2.6*    No results for input(s): LIPASE, AMYLASE in the last 168 hours.  No results for input(s): AMMONIA in the last 168 hours.  CBC: Recent Labs  Lab 10/07/21 1045 10/10/21 0457 10/12/21 0908  WBC 7.0 8.0 14.2*  NEUTROABS  --   --  11.5*  HGB 9.4* 11.2* 9.5*  HCT 30.9* 37.0 31.2*  MCV 98.7 98.4 97.8  PLT 206 239 248     Cardiac Enzymes: No results for input(s): CKTOTAL, CKMB, CKMBINDEX, TROPONINI in the last 168 hours.  BNP: Invalid input(s): POCBNP  CBG: No results for input(s): GLUCAP in the last 168 hours.   Microbiology: Results for orders placed or performed during the hospital encounter of 09/30/21  Resp Panel by RT-PCR (Flu A&B, Covid) Nasopharyngeal Swab     Status: None   Collection Time: 09/30/21  9:34 AM   Specimen: Nasopharyngeal Swab; Nasopharyngeal(NP) swabs in vial transport medium  Result Value Ref Range Status   SARS Coronavirus 2 by  RT PCR NEGATIVE NEGATIVE Final    Comment: (NOTE) SARS-CoV-2 target nucleic acids are NOT DETECTED.  The SARS-CoV-2 RNA is generally detectable in upper respiratory specimens during the acute phase of infection. The lowest concentration of SARS-CoV-2 viral copies this assay can detect is 138 copies/mL. A negative result does not preclude SARS-Cov-2 infection and should not be used as the sole basis for treatment or other patient management decisions. A negative result may occur with  improper specimen  collection/handling, submission of specimen other than nasopharyngeal swab, presence of viral mutation(s) within the areas targeted by this assay, and inadequate number of viral copies(<138 copies/mL). A negative result must be combined with clinical observations, patient history, and epidemiological information. The expected result is Negative.  Fact Sheet for Patients:  EntrepreneurPulse.com.au  Fact Sheet for Healthcare Providers:  IncredibleEmployment.be  This test is no t yet approved or cleared by the [place] FDA and  has been authorized for detection and/or diagnosis of SARS-CoV-2 by FDA under an Emergency Use Authorization (EUA). This EUA will remain  in effect (meaning this test can be used) for the duration of the COVID-19 declaration under Section 564(b)(1) of the Act, 21 U.S.C.section 360bbb-3(b)(1), unless the authorization is terminated  or revoked sooner.       Influenza A by PCR NEGATIVE NEGATIVE Final   Influenza B by PCR NEGATIVE NEGATIVE Final    Comment: (NOTE) The Xpert Xpress SARS-CoV-2/FLU/RSV plus assay is intended as an aid in the diagnosis of influenza from Nasopharyngeal swab specimens and should not be used as a sole basis for treatment. Nasal washings and aspirates are unacceptable for Xpert Xpress SARS-CoV-2/FLU/RSV testing.  Fact Sheet for Patients: EntrepreneurPulse.com.au  Fact Sheet for Healthcare Providers: IncredibleEmployment.be  This test is not yet approved or cleared by the [place] FDA and has been authorized for detection and/or diagnosis of SARS-CoV-2 by FDA under an Emergency Use Authorization (EUA). This EUA will remain in effect (meaning this test can be used) for the duration of the COVID-19 declaration under Section 564(b)(1) of the Act, 21 U.S.C. section 360bbb-3(b)(1), unless the authorization is terminated or revoked.  Performed at [facility], [address] [place], [place] [address]     Coagulation Studies:   Urinalysis: No results for input(s): COLORURINE, LABSPEC, PHURINE, GLUCOSEU, HGBUR, BILIRUBINUR, KETONESUR, PROTEINUR, UROBILINOGEN, NITRITE, LEUKOCYTESUR in the last 72 hours.  Invalid input(s): APPERANCEUR     Imaging: No results found.   Medications:       amLODipine  10 mg Oral QPM   amoxicillin-clavulanate  1 tablet Oral Q12H   calcium acetate  667 mg Oral TID WC   carvedilol  12.5 mg Oral QPM   Chlorhexidine Gluconate Cloth  6 each Topical Q0600   docusate sodium  100 mg Oral Once   docusate sodium  100 mg Oral Daily   heparin sodium (porcine)       ondansetron (ZOFRAN) IV  4 mg Intravenous Once   acetaminophen, ondansetron, oxyCODONE-acetaminophen, tiZANidine  Assessment/ Plan:  66 y.o. female with ESRD on HD TTS, diabetes type 2, hypertension presented to the hospital seeking long term placement.   Hyperkalemia- Potassium 5.3, will be corrected with dialysis.   Homelessness (Z 59.0) Due to insurance, patient must reside in [place] to receive dialysis from a [place] clinic. Patient being evaluated by [facility] care for long term placement.   ESRD on hemodialysis on TTS. Uremia R39.2  Receiving treatment today, UF goal 0.5L as tolerated.   Hypertension. Continue  amlodipine and carvedilol. BP 147/68 during dialysis  Anemia of chronic disease.  Hgb 9.5, will continue to monitor  Secondary hyperparathyroidism.   Lab Results  Component Value Date   PTH 70 (H) 05/12/2020   CALCIUM 8.3 (L) 10/12/2021   PHOS 4.4 10/12/2021    Will continue to monitor bone minerals during this admission.   7. Infected dog bite of hand including left finger. Remains on oral antibiotics.     LOS: 0 Randal Yepiz 6/1/202312:01 PM

## 2021-10-12 NOTE — ED Notes (Signed)
Verbal order given by Dr. Alfred Levins to use in and out catheter to empty bladder.  In and out preformed and 400 mL urine obtained

## 2021-10-12 NOTE — ED Notes (Signed)
Patient reports she feels like she needs to urinate and is unable to.  Has urinary catheter in place.  Bladder scan preformed.  Results: 347 mL of urine

## 2021-10-12 NOTE — ED Notes (Signed)
Patient given ginger ale. 

## 2021-10-12 NOTE — ED Notes (Signed)
Patient assisted with meal tray setup. RN spoke with ED provider about consulting PT/OT for patient.

## 2021-10-13 MED ORDER — GUAIFENESIN 100 MG/5ML PO LIQD
10.0000 mL | ORAL | Status: DC | PRN
  Administered 2021-10-13 – 2021-11-14 (×10): 10 mL via ORAL
  Filled 2021-10-13 (×13): qty 10

## 2021-10-13 NOTE — ED Notes (Addendum)
Pt remains on 3 liter [place]. Pt is AOX4, in NAD

## 2021-10-13 NOTE — Evaluation (Signed)
Physical Therapy Evaluation Patient Details Name: Katie [name] MRN: [id number] DOB: [birth year] Today's Date: 10/13/2021  History of Present Illness  Pt is a 66 y.o. female presenting to hospital 5/20 with concerns for worsening L hand pain d/t animal bite (pt recently admitted 5/13-5/17 for same reason)--pt not taking antibiotics for known dog bite.  Pt's friend dropped her off at dialysis and refused to do any more (pt now homeless).  PMH includes ESRD on HD TTS, DM, htn, orthostatic hypotension, and h/o mandible surgery.  Clinical Impression  Prior to ED visit, pt was modified independent ambulating with B SPC's; currently does not have a place to live.  Pt on 3 L O2 via nasal cannula at rest beginning of session but pt reports only using O2 during dialysis or when laying down in hospital bed but has never used oxygen at home (nurse cleared PT to trial pt on room air for mobility).  During session pt is modified independent with bed mobility and transfers; and CGA to min assist to ambulate 20 feet with B SPC's.  Pt with mild SOB and c/o dizziness when ambulating limiting distance able to walk.  Pt's O2 sats 80% on room air post ambulation but returned to 92% on 3 L O2 via nasal cannula at rest end of session.  Nursing notified regarding O2 concerns.  Pt demonstrating impaired activity tolerance during sessions activities.  Pt would benefit from skilled PT to address noted impairments and functional limitations (see below for any additional details).  Upon hospital discharge, pt would benefit from SNF.    Recommendations for follow up therapy are one component of a multi-disciplinary discharge planning process, led by the attending physician.  Recommendations may be updated based on patient status, additional functional criteria and insurance authorization.  Follow Up Recommendations Skilled nursing-short term rehab (<3 hours/day)    Assistance Recommended at Discharge Intermittent  Supervision/Assistance  Patient can return home with the following  A little help with walking and/or transfers;A little help with bathing/dressing/bathroom;Assistance with cooking/housework;Assist for transportation;Help with stairs or ramp for entrance    Equipment Recommendations Other (comment) (pt has B SPC's she prefers to use when walking)  Recommendations for Other Services  OT consult    Functional Status Assessment Patient has had a recent decline in their functional status and demonstrates the ability to make significant improvements in function in a reasonable and predictable amount of time.     Precautions / Restrictions Precautions Precautions: Fall Restrictions Weight Bearing Restrictions: No      Mobility  Bed Mobility Overal bed mobility: Needs Assistance Bed Mobility: Supine to Sit, Sit to Supine     Supine to sit: Modified independent (Device/Increase time) Sit to supine: Modified independent (Device/Increase time)   General bed mobility comments: Increased effort to perform on own    Transfers Overall transfer level: Needs assistance Equipment used:  (B straight canes) Transfers: Sit to/from Stand Sit to Stand: Modified independent (Device/Increase time)           General transfer comment: increased effort to stand from bed but steady    Ambulation/Gait Ambulation/Gait assistance: Min assist, Min guard Gait Distance (Feet): 20 Feet Assistive device:  (B straight canes)   Gait velocity: decreased     General Gait Details: partial step through gait pattern; mildly unsteady at times (mild SOB noted and pt reporting dizziness with mobility)  Stairs            Wheelchair Mobility    Modified Rankin (Stroke Patients  Only)       Balance Overall balance assessment: History of Falls, Needs assistance Sitting-balance support: No upper extremity supported, Feet supported Sitting balance-Leahy Scale: Normal Sitting balance - Comments:  steady sitting reaching outside BOS   Standing balance support: Bilateral upper extremity supported, Reliant on assistive device for balance Standing balance-Leahy Scale: Fair Standing balance comment: occasional mild unsteadiness ambulating with B SPC's                             Pertinent Vitals/Pain Pain Assessment Pain Assessment: Faces Faces Pain Scale: Hurts a little bit Pain Location: L hand with movement Pain Descriptors / Indicators: Grimacing, Guarding Pain Intervention(s): Limited activity within patient's tolerance, Monitored during session, Premedicated before session, Repositioned HR WFL during sessions activities.    Home Living Family/patient expects to be discharged to:: Skilled nursing facility (pt reports currently being homeless)                   Additional Comments: Pt reports unable to go back to living with her friend.    Prior Function Prior Level of Function : Independent/Modified Independent             Mobility Comments: Modified independent ambulating with B SPC's; h/o falls ADLs Comments: Per OT eval today "MOD I in ADL completion, performs bathing at the sink. Does not drive, history of falls (1 approx 6 months ago per chart review, pt reports multiple approx 1 year ago and frequent LOB)"     Hand Dominance   Dominant Hand: Right    Extremity/Trunk Assessment   Upper Extremity Assessment Upper Extremity Assessment: LUE deficits/detail LUE Deficits / Details: decreased L hand grip strength and finger flexion ROM d/t swelling LUE: Unable to fully assess due to pain    Lower Extremity Assessment Lower Extremity Assessment:  (4+/5 B LE hip flexion, knee flexion/extension, and DF/PF; h/o neuropathy (decreased sensation dorsal surface R foot and plantar surface B feet))    Cervical / Trunk Assessment Cervical / Trunk Assessment: Normal  Communication   Communication: No difficulties  Cognition Arousal/Alertness:  Awake/alert Behavior During Therapy: WFL for tasks assessed/performed Overall Cognitive Status: Within Functional Limits for tasks assessed                                 General Comments: A&O x4        General Comments Nursing cleared pt for participation in physical therapy.  Pt agreeable to PT session.    Exercises     Assessment/Plan    PT Assessment Patient needs continued PT services  PT Problem List Decreased strength;Decreased activity tolerance;Decreased balance;Decreased mobility;Decreased knowledge of precautions;Cardiopulmonary status limiting activity;Pain       PT Treatment Interventions DME instruction;Gait training;Functional mobility training;Therapeutic activities;Therapeutic exercise;Balance training;Patient/family education    PT Goals (Current goals can be found in the Care Plan section)  Acute Rehab PT Goals Patient Stated Goal: improve mobility PT Goal Formulation: With patient Time For Goal Achievement: 10/27/21 Potential to Achieve Goals: Good    Frequency Min 2X/week     Co-evaluation               AM-PAC PT "6 Clicks" Mobility  Outcome Measure Help needed turning from your back to your side while in a flat bed without using bedrails?: None Help needed moving from lying on your back to sitting on the  side of a flat bed without using bedrails?: None Help needed moving to and from a bed to a chair (including a wheelchair)?: A Little Help needed standing up from a chair using your arms (e.g., wheelchair or bedside chair)?: A Little Help needed to walk in hospital room?: A Little Help needed climbing 3-5 steps with a railing? : A Lot 6 Click Score: 19    End of Session Equipment Utilized During Treatment: Gait belt Activity Tolerance: Other (comment) (Limited d/t SOB and c/o dizziness with mobility) Patient left: in bed;with call bell/phone within reach;with bed alarm set Nurse Communication: Mobility  status;Precautions;Other (comment) (Pt's O2 sats during session) PT Visit Diagnosis: Other abnormalities of gait and mobility (R26.89);Unsteadiness on feet (R26.81);Muscle weakness (generalized) (M62.81);History of falling (Z91.81);Pain Pain - Right/Left: Left Pain - part of body: Hand    Time: 6834-1962 PT Time Calculation (min) (ACUTE ONLY): 17 min   Charges:   PT Evaluation $PT Eval Low Complexity: 1 Low         Ladina Shutters, PT 10/13/21, 1:48 PM

## 2021-10-13 NOTE — Evaluation (Signed)
Occupational Therapy Evaluation Patient Details Name: Katie [name] MRN: [id number] DOB: 01-29-1956 Today's Date: 10/13/2021   History of Present Illness Pt is a 66 year old female who was recently admitted and received antibiotics for a dog bite.  She was discharged with a prescription for home antibiotics.  She states her caregivers were unable to retrieve antibiotics.  She states that there was an argument and that she is here seeking long-term placement. PMH significant for of ESRD with hemodialysis TTS, diabetes mellitus, hypertension   Clinical Impression   Chart reviewed, pt greeted in room agreeable to OT evaluation. PTA pt reports generally MOD I for ADL however requires assist for LB dressing and assist required for IADLs such as driving and grocery shopping as pt does not drive. Pt reports since last admission increased assistance needed in all ADL due to L hand pain. Pt also reports she is fearful of falling. Pt presents with deficits in balance, activity tolerance, L Hand function all affecting safe and optimal ADL completion. At this time recommend STR to address functional deficits. OT will continue to follow acutely.      Recommendations for follow up therapy are one component of a multi-disciplinary discharge planning process, led by the attending physician.  Recommendations may be updated based on patient status, additional functional criteria and insurance authorization.   Follow Up Recommendations  Skilled nursing-short term rehab (<3 hours/day)    Assistance Recommended at Discharge Intermittent Supervision/Assistance  Patient can return home with the following A little help with walking and/or transfers;A little help with bathing/dressing/bathroom;Assistance with cooking/housework;Assistance with feeding;Assist for transportation    Functional Status Assessment  Patient has had a recent decline in their functional status and demonstrates the ability to make significant  improvements in function in a reasonable and predictable amount of time.  Equipment Recommendations  BSC/3in1    Recommendations for Other Services       Precautions / Restrictions Precautions Precautions: Fall Restrictions Weight Bearing Restrictions: No      Mobility Bed Mobility Overal bed mobility: Needs Assistance Bed Mobility: Supine to Sit, Sit to Supine     Supine to sit: Modified independent (Device/Increase time) Sit to supine: Modified independent (Device/Increase time)        Transfers Overall transfer level: Needs assistance Equipment used:  (2 straight canes) Transfers: Sit to/from Stand Sit to Stand: Modified independent (Device/Increase time)                  Balance Overall balance assessment: History of Falls, Needs assistance Sitting-balance support: Feet supported Sitting balance-Leahy Scale: Normal     Standing balance support: Bilateral upper extremity supported, Reliant on assistive device for balance Standing balance-Leahy Scale: Fair                             ADL either performed or assessed with clinical judgement   ADL Overall ADL's : Needs assistance/impaired     Grooming: Standing;Min guard Grooming Details (indicate cue type and reason): simulated         Upper Body Dressing : Set up;Sitting   Lower Body Dressing: Maximal assistance Lower Body Dressing Details (indicate cue type and reason): pt reports she has had difficulty with LB dressing "for awhile" and someone must assist with donning/doffing shoes esp on LLE Toilet Transfer: Min guard;Ambulation Toilet Transfer Details (indicate cue type and reason): with 2 SPC         Functional mobility during ADLs:  Min guard (with 2 SPC)       Vision Baseline Vision/History: 1 Wears glasses Patient Visual Report: No change from baseline Additional Comments: pt with baseline blurring of vision, reports she requires glasses that she does not have or wear      Perception     Praxis      Pertinent Vitals/Pain Pain Assessment Pain Assessment: Faces Faces Pain Scale: Hurts a little bit Pain Location: L hand with movement Pain Descriptors / Indicators: Grimacing, Guarding Pain Intervention(s): Limited activity within patient's tolerance, Monitored during session     Hand Dominance Right   Extremity/Trunk Assessment Upper Extremity Assessment Upper Extremity Assessment: LUE deficits/detail LUE Deficits / Details: able to perform weak gross grasp LUE: Unable to fully assess due to pain   Lower Extremity Assessment Lower Extremity Assessment: Defer to PT evaluation;Overall [facility] for tasks assessed   Cervical / Trunk Assessment Cervical / Trunk Assessment: Normal   Communication Communication Communication: No difficulties   Cognition Arousal/Alertness: Awake/alert Behavior During Therapy: WFL for tasks assessed/performed Overall Cognitive Status: Within Functional Limits for tasks assessed                                 General Comments: pt is alert and oriented x4, appears self limiting during functional mobility reports this is due to fear of falling     General Comments  pt spo2 down to 85% on RA after amb to toilet, up to 93% on 3L via     Exercises Other Exercises Other Exercises: edu re: role of OT, role of rehab, discharge recommendations, falls prevention   Shoulder Instructions      Home Living Family/patient expects to be discharged to:: Skilled nursing facility              Note                      Additional Comments: pt was living with a friend PTA, reports this is no longer a feasible living situation; pt is requesting STR      Prior Functioning/Environment Prior Level of Function : Independent/Modified Independent             Mobility Comments: 2 SPC for mobility ADLs Comments: MOD I in ADL completion, performs bathing at the sink. Does not drive, history of falls  (1 approx 6 months ago per chart review, pt reports multiple approx 1 year ago and frequent LOB)        OT Problem List:        OT Treatment/Interventions: Self-care/ADL training;Therapeutic exercise;DME and/or AE instruction;Therapeutic activities;Patient/family education;Balance training    OT Goals(Current goals can be found in the care plan section) Acute Rehab OT Goals Patient Stated Goal: get stronger OT Goal Formulation: With patient Time For Goal Achievement: 10/27/21 Potential to Achieve Goals: Good ADL Goals Pt Will Perform Grooming: with supervision;sitting Pt Will Transfer to Toilet: ambulating;regular height toilet;with supervision Pt Will Perform Toileting - Clothing Manipulation and hygiene: with supervision;sit to/from stand Pt Will Perform Tub/Shower Transfer: with supervision Pt/caregiver will Perform Home Exercise Program: Left upper extremity;With written HEP provided  OT Frequency: Min 2X/week    Co-evaluation              AM-PAC OT "6 Clicks" Daily Activity     Outcome Measure Help from another person eating meals?: None Help from another person taking care of personal grooming?: None Help from  another person toileting, which includes using toliet, bedpan, or urinal?: None Help from another person bathing (including washing, rinsing, drying)?: A Little Help from another person to put on and taking off regular upper body clothing?: A Little Help from another person to put on and taking off regular lower body clothing?: A Lot 6 Click Score: 20   End of Session Equipment Utilized During Treatment: Oxygen (2 SPC) Nurse Communication: Mobility status;Other (comment) (sats)  Activity Tolerance: Patient tolerated treatment well Patient left: in bed;with call bell/phone within reach;with bed alarm set  OT Visit Diagnosis: Unsteadiness on feet (R26.81)                Time: 7493-5521 OT Time Calculation (min): 17 min Charges:  OT General Charges $OT  Visit: 1 Visit OT Evaluation $OT Eval Low Complexity: 1 Low  Shanon Payor, OTD OTR/L  10/13/21, 10:58 AM

## 2021-10-13 NOTE — ED Notes (Signed)
Pt not wearing O2 when this RN came into room, pt's sats 88 % on room air, pt encouraged to leave O2 in place. O2 3 L [place] in place, pt sats 100% on 3 L [place]

## 2021-10-13 NOTE — ED Notes (Signed)
Pt helped to toilet with stand by assist, pt using two canes for ambulation. Pt assisted with combing hair.

## 2021-10-13 NOTE — ED Notes (Signed)
Pt assisted to toilet with two walkers, cleaned with body wipes, new mesh underwear and pad applied, fresh chux applied. Pt reports she just ate dinner.

## 2021-10-13 NOTE — ED Notes (Addendum)
Per PT, pt c/o SOB while walking, pt placed back in bed, pt put back on 3 Lnc, pt denies SOB at this time. Pt placed on O2 monitor for monitoring. Pt 98% on 3 L [place] at this time.

## 2021-10-13 NOTE — ED Provider Notes (Signed)
Emergency Medicine Observation Re-evaluation Note  Katie [name] is a 66 y.o. female, remains in the emergency department currently awaiting social work for placement to an appropriate living facility  Physical Exam  BP 124/62   Pulse 71   Temp 99.4 F (37.4 C) (Oral)   Resp 15   Ht 5\' 6"  (1.676 m)   Wt 80.6 kg   SpO2 100%   BMI 28.68 kg/m   No acute events overnight or during my shift today  ED Course / MDM   Patient had lab work performed yesterday showing slight leukocytosis white count of 14,000.  Continues to have an elevated creatinine although largely unchanged from historical values  Plan  Current plan is for placement in appropriate living facility once available.  Katie [name] is not under involuntary commitment.     Harvest Dark, MD 10/13/21 2136

## 2021-10-13 NOTE — ED Notes (Signed)
Pt assisted to restroom, food provided and hygenic needs met. Meds will be brought per request at Q6.

## 2021-10-13 NOTE — ED Provider Notes (Signed)
-----------------------------------------   4:39 AM on 10/13/2021 -----------------------------------------   Blood pressure 118/67, pulse 83, temperature 98.5 F (36.9 C), temperature source Oral, resp. rate 17, height 1.676 m (5\' 6" ), weight 80.6 kg, SpO2 98 %.  The patient is calm and cooperative at this time.  There have been no acute events since the last update.  Awaiting disposition plan from [facility] team.   Hinda Kehr, MD 10/13/21 [phone]

## 2021-10-14 MED ORDER — HEPARIN SODIUM (PORCINE) 1000 UNIT/ML IJ SOLN
2000.0000 [IU] | Freq: Once | INTRAMUSCULAR | Status: AC
  Administered 2021-10-14: 2000 [IU] via INTRAVENOUS
  Filled 2021-10-14: qty 2

## 2021-10-14 MED ORDER — HEPARIN SODIUM (PORCINE) 1000 UNIT/ML IJ SOLN
INTRAMUSCULAR | Status: AC
  Filled 2021-10-14: qty 10

## 2021-10-14 NOTE — Progress Notes (Signed)
Hemodialysis Post Treatment Note:   Tx date: 10/14/2021   Tx time: 3 hours and 30 minutes   Access: Right CVC   UF Removed:  532 ml   Note:   Patient started treatment as ordered. Tolerated well. No complications. Patient hemodynamically.

## 2021-10-14 NOTE — ED Notes (Signed)
Sandwich tray given to pt.

## 2021-10-14 NOTE — ED Notes (Signed)
Lunch tray delivered to patient's room. Pt remains gone to dialysis.

## 2021-10-14 NOTE — ED Notes (Signed)
Report to Dialysis

## 2021-10-14 NOTE — Progress Notes (Signed)
[facility]  ROUNDING NOTE   Subjective:   Patient is a 66 year old female with past medical history of ESRD with hemodialysis TTS, diabetes mellitus, hypertension to the hospital seeking long term placement. She was recently admitted and received antibiotics for a dog bite.  She was discharged with a prescription for home antibiotics.  She states her caregivers were unable to retrieve antibiotics.  She states that there was an argument and that she is here seeking long-term placement.  Patient receives outpatient dialysis treatments at [facility] on a TTS schedule.  Supervised by Dr. Holley Raring.    Update: Patient completed HD treatment today, tolerated well.   Disposition still being searched for.     Objective:  Vital signs in last 24 hours:  Temp:  [98.4 F (36.9 C)-98.9 F (37.2 C)] 98.9 F (37.2 C) (06/03 1218) Pulse Rate:  [65-80] 80 (06/03 1145) Resp:  [12-25] 25 (06/03 1145) BP: (108-142)/(52-86) 114/68 (06/03 1145) SpO2:  [92 %-100 %] 97 % (06/03 1145) Weight:  [87.1 kg] 87.1 kg (06/03 0817)  Weight change:  Filed Weights   10/12/21 0858 10/12/21 1252 10/14/21 0817  Weight: 81.2 kg 80.6 kg 87.1 kg    Intake/Output: No intake/output data recorded.   Intake/Output this shift:  Total I/O In: -  Out: 532 [Other:532]  Physical Exam: General: No acute distress  Head: Normocephalic, atraumatic. Moist oral mucosal membranes  Eyes: Anicteric  Lungs:  Clear to auscultation, normal effort  Heart: S1S2 no rubs  Abdomen:  Soft, nontender, bowel sounds present  Extremities: No lower extremity edema or cyanosis.   Neurologic: Awake, alert, following commands  Skin: No acute rash,  Left third finger sutures  Access: R Permacath.  L AVG    Basic Metabolic Panel: Recent Labs  Lab 10/08/21 0806 10/10/21 0457 10/12/21 0908  NA 137 133* 134*  K 5.1 5.4* 5.3*  CL 96* 97* 97*  CO2 31 25 28   GLUCOSE 121* 96 172*  BUN 24* 44* 33*  CREATININE 4.82*  7.06* 6.30*  CALCIUM 9.2 8.8* 8.3*  PHOS 4.3 5.2* 4.4     Liver Function Tests: Recent Labs  Lab 10/08/21 0806 10/10/21 0457 10/12/21 0908  ALBUMIN 2.9* 2.9* 2.6*    No results for input(s): LIPASE, AMYLASE in the last 168 hours.  No results for input(s): AMMONIA in the last 168 hours.  CBC: Recent Labs  Lab 10/10/21 0457 10/12/21 0908  WBC 8.0 14.2*  NEUTROABS  --  11.5*  HGB 11.2* 9.5*  HCT 37.0 31.2*  MCV 98.4 97.8  PLT 239 248     Cardiac Enzymes: No results for input(s): CKTOTAL, CKMB, CKMBINDEX, TROPONINI in the last 168 hours.  BNP: Invalid input(s): POCBNP  CBG: No results for input(s): GLUCAP in the last 168 hours.   Microbiology: Results for orders placed or performed during the hospital encounter of 09/30/21  Resp Panel by RT-PCR (Flu A&B, Covid) Nasopharyngeal Swab     Status: None   Collection Time: 09/30/21  9:34 AM   Specimen: Nasopharyngeal Swab; Nasopharyngeal(NP) swabs in vial transport medium  Result Value Ref Range Status   SARS Coronavirus 2 by RT PCR NEGATIVE NEGATIVE Final    Comment: (NOTE) SARS-CoV-2 target nucleic acids are NOT DETECTED.  The SARS-CoV-2 RNA is generally detectable in upper respiratory specimens during the acute phase of infection. The lowest concentration of SARS-CoV-2 viral copies this assay can detect is 138 copies/mL. A negative result does not preclude SARS-Cov-2 infection and should not be used  as the sole basis for treatment or other patient management decisions. A negative result may occur with  improper specimen collection/handling, submission of specimen other than nasopharyngeal swab, presence of viral mutation(s) within the areas targeted by this assay, and inadequate number of viral copies(<138 copies/mL). A negative result must be combined with clinical observations, patient history, and epidemiological information. The expected result is Negative.  Fact Sheet for Patients:   EntrepreneurPulse.com.au  Fact Sheet for Healthcare Providers:  IncredibleEmployment.be  This test is no t yet approved or cleared by the [place] FDA and  has been authorized for detection and/or diagnosis of SARS-CoV-2 by FDA under an Emergency Use Authorization (EUA). This EUA will remain  in effect (meaning this test can be used) for the duration of the COVID-19 declaration under Section 564(b)(1) of the Act, 21 U.S.C.section 360bbb-3(b)(1), unless the authorization is terminated  or revoked sooner.       Influenza A by PCR NEGATIVE NEGATIVE Final   Influenza B by PCR NEGATIVE NEGATIVE Final    Comment: (NOTE) The Xpert Xpress SARS-CoV-2/FLU/RSV plus assay is intended as an aid in the diagnosis of influenza from Nasopharyngeal swab specimens and should not be used as a sole basis for treatment. Nasal washings and aspirates are unacceptable for Xpert Xpress SARS-CoV-2/FLU/RSV testing.  Fact Sheet for Patients: EntrepreneurPulse.com.au  Fact Sheet for Healthcare Providers: IncredibleEmployment.be  This test is not yet approved or cleared by the [place] FDA and has been authorized for detection and/or diagnosis of SARS-CoV-2 by FDA under an Emergency Use Authorization (EUA). This EUA will remain in effect (meaning this test can be used) for the duration of the COVID-19 declaration under Section 564(b)(1) of the Act, 21 U.S.C. section 360bbb-3(b)(1), unless the authorization is terminated or revoked.  Performed at [facility], [address] [place], [place] [address]     Coagulation Studies:   Urinalysis: No results for input(s): COLORURINE, LABSPEC, PHURINE, GLUCOSEU, HGBUR, BILIRUBINUR, KETONESUR, PROTEINUR, UROBILINOGEN, NITRITE, LEUKOCYTESUR in the last 72 hours.  Invalid input(s): APPERANCEUR     Imaging: [place] Dialysis Access  Result Date: 10/13/2021 CLINICAL DATA:   66 year old female with history of end-stage renal disease and left upper extremity arteriovenous graft for hemodialysis access. EXAM: Left upper extremity hemodialysis access duplex TECHNIQUE: Gray-scale sonography with graded compression, as well as color Doppler and duplex ultrasound were performed to evaluate the left upper extremity hemodialysis access circuit. Spectral Doppler was utilized to evaluate flow. COMPARISON:  None Available. FINDINGS: LEFT UPPER EXTREMITY Left upper extremity brachial artery to axillary vein graft is present and patent throughout. The graft measures approximately 7 mm in diameter diameter. Flow velocities: Proximal: 223 centimeters/second Mid: 232 centimeters/second Distal: 386 centimeters/second. IMPRESSION: Patent left brachial to axillary arteriovenous graft. Mild flow velocity elevation about the distal aspect of the graft raising question for distal anastomotic stenosis. Ruthann Cancer, MD [facility] Electronically Signed   By: Ruthann Cancer M.D.   On: 10/13/2021 10:30     Medications:       amLODipine  10 mg Oral QPM   calcium acetate  667 mg Oral TID WC   carvedilol  12.5 mg Oral QPM   Chlorhexidine Gluconate Cloth  6 each Topical Q0600   docusate sodium  100 mg Oral Once   docusate sodium  100 mg Oral Daily   ondansetron (ZOFRAN) IV  4 mg Intravenous Once   acetaminophen, guaiFENesin, ondansetron, oxyCODONE-acetaminophen, tiZANidine  Assessment/ Plan:  66 y.o. female with  ESRD on HD TTS, diabetes type 2, hypertension presented to the hospital seeking long term placement.   Hyperkalemia- K was 5.3 at last check, 2k bath was used today.   Homelessness (Z 59.0) Due to insurance, patient must reside in [place] to receive dialysis from a [facility]. Patient being evaluated by [facility] care for long term placement.   ESRD on hemodialysis on TTS. Uremia R39.2  Receiving treatment  today, tolerated well.   Hypertension. Maintain pt on amlodipine and carvedilol.   Anemia of chronic disease.  Hgb 9.5 at last check, will monitor.   Secondary hyperparathyroidism.   Lab Results  Component Value Date   PTH 70 (H) 05/12/2020   CALCIUM 8.3 (L) 10/12/2021   PHOS 4.4 10/12/2021    Phos at target at last check.  Continue calcium acetate.   7. Infected dog bite of hand including left finger. Remains on oral antibiotics.     LOS: 0 Alaysia Lightle 6/3/202312:54 PM

## 2021-10-14 NOTE — ED Notes (Signed)
Pt assisted to bathroom, with TV, and prune juice provided.

## 2021-10-14 NOTE — ED Notes (Signed)
Pt eating breakfast at this time. Dialysis consent obtained and sent with pt and transport.

## 2021-10-15 NOTE — ED Notes (Signed)
Assisted pt to toilet then back to bed.

## 2021-10-15 NOTE — ED Notes (Signed)
RN to bedside to introduce self to pt. Pt is sleeping.

## 2021-10-15 NOTE — ED Notes (Signed)
TOC Placement continued

## 2021-10-15 NOTE — ED Notes (Signed)
Pt assisted to bed side toilet then back to bed.

## 2021-10-16 NOTE — ED Provider Notes (Signed)
-----------------------------------------   6:24 AM on 10/16/2021 -----------------------------------------   Blood pressure (!) 140/59, pulse 82, temperature 98.9 F (37.2 C), temperature source Oral, resp. rate (!) 82, height 5\' 6"  (1.676 m), weight 87.1 kg, SpO2 92 %.  The patient is calm and cooperative at this time.  There have been no acute events since the last update.  Awaiting disposition plan from Social Work team.   Paulette Blanch, MD 10/16/21 [phone]

## 2021-10-16 NOTE — ED Notes (Signed)
TOC placement 

## 2021-10-16 NOTE — ED Notes (Signed)
Breakfast meal tray given at this time.  

## 2021-10-16 NOTE — TOC Progression Note (Signed)
Transition of Care [facility]) - Progression Note    Patient Details  Name: Katie [name] MRN: [id number] Date of Birth: [birth year]  Transition of Care [facility]) CM/SW Contact  Shelbie Hutching, RN Phone Number: 10/16/2021, 12:39 PM  Clinical Narrative:     Reached out to [place] in [place], left a message and have not heard back yet.  RNCM also reached out to [place] with Milus Glazier and [employer] and rehab.  Ebony Hail will review referral.   Milus Glazier with [place] DSS APS here at bedside to see patient.   Expected Discharge Plan: Skilled Nursing Facility Barriers to Discharge: SNF Pending bed offer  Expected Discharge Plan and Services Expected Discharge Plan: [place]   Discharge Planning Services: CM Consult Post Acute Care Choice: [place] Living arrangements for the past 2 months: Single Family Home                 DME Arranged: N/A DME Agency: NA       HH Arranged: NA HH Agency: NA         Social Determinants of Health (SDOH) Interventions    Readmission Risk Interventions    09/27/2021    1:44 PM 09/26/2021   12:46 PM 03/08/2021    2:49 PM  Readmission Risk Prevention Plan  Transportation Screening  Complete Complete  Palliative Care Screening Not Applicable    Medication Review (RN Care Manager)  Complete Complete  HRI or Home Care Consult   Complete  SW Recovery Care/Counseling Consult  Complete   Palliative Care Screening  Not Applicable   [place]  Not Applicable Complete

## 2021-10-16 NOTE — ED Notes (Signed)
Pt requesting nausea medication at this time.

## 2021-10-17 LAB — CBC
HCT: 30 % — ABNORMAL LOW (ref 36.0–46.0)
Hemoglobin: 9.1 g/dL — ABNORMAL LOW (ref 12.0–15.0)
MCH: 29.5 pg (ref 26.0–34.0)
MCHC: 30.3 g/dL (ref 30.0–36.0)
MCV: 97.4 fL (ref 80.0–100.0)
Platelets: 255 10*3/uL (ref 150–400)
RBC: 3.08 MIL/uL — ABNORMAL LOW (ref 3.87–5.11)
RDW: 14.6 % (ref 11.5–15.5)
WBC: 6.4 10*3/uL (ref 4.0–10.5)
nRBC: 0 % (ref 0.0–0.2)

## 2021-10-17 LAB — RENAL FUNCTION PANEL
Albumin: 2.6 g/dL — ABNORMAL LOW (ref 3.5–5.0)
Anion gap: 8 (ref 5–15)
BUN: 35 mg/dL — ABNORMAL HIGH (ref 8–23)
CO2: 28 mmol/L (ref 22–32)
Calcium: 8.6 mg/dL — ABNORMAL LOW (ref 8.9–10.3)
Chloride: 101 mmol/L (ref 98–111)
Creatinine, Ser: 6.41 mg/dL — ABNORMAL HIGH (ref 0.44–1.00)
GFR, Estimated: 7 mL/min — ABNORMAL LOW (ref >60)
Glucose, Bld: 84 mg/dL (ref 70–99)
Phosphorus: 4 mg/dL (ref 2.5–4.6)
Potassium: 4.8 mmol/L (ref 3.5–5.1)
Sodium: 137 mmol/L (ref 135–145)

## 2021-10-17 MED ORDER — HEPARIN SODIUM (PORCINE) 1000 UNIT/ML IJ SOLN
INTRAMUSCULAR | Status: AC
Start: 2021-10-17 — End: 2021-10-17
  Filled 2021-10-17: qty 10

## 2021-10-17 NOTE — ED Notes (Signed)
Pt used bedside commode with assistance.

## 2021-10-17 NOTE — ED Notes (Signed)
Patient given lunch tray.

## 2021-10-17 NOTE — ED Notes (Signed)
Patient transported to dialysis

## 2021-10-17 NOTE — ED Notes (Signed)
Dietary called to send lunch tray now that patient is back from dialysis

## 2021-10-17 NOTE — ED Notes (Signed)
OT at bedside. 

## 2021-10-17 NOTE — Progress Notes (Signed)
Occupational Therapy Treatment Patient Details Name: Jobina Maita MRN: [id number] DOB: November 06, 1955 Today's Date: 10/17/2021   History of present illness Pt is a 66 y.o. female presenting to hospital 5/20 with concerns for worsening L hand pain d/t animal bite (pt recently admitted 5/13-5/17 for same reason)--pt not taking antibiotics for known dog bite.  Pt's friend dropped her off at dialysis and refused to do any more (pt now homeless).  PMH includes ESRD on HD TTS, DM, htn, orthostatic hypotension, and h/o mandible surgery.   OT comments  Ms Fitzgibbons was seen for OT treatment on this date. Upon arrival to room pt reclined in bed, agreeable to tx. Pt requires MOD A for LB access bed level - reports dizziness in sitting. CGA + RW sit<>stand, tolerates ~10 sec standing, limited by dizziness first trial. Improved to 3 min standing marching second trial. Pt making good progress toward goals. Will continue to follow POC. Discharge recommendation remains appropriate.     Recommendations for follow up therapy are one component of a multi-disciplinary discharge planning process, led by the attending physician.  Recommendations may be updated based on patient status, additional functional criteria and insurance authorization.    Follow Up Recommendations  Skilled nursing-short term rehab (<3 hours/day)    Assistance Recommended at Discharge Intermittent Supervision/Assistance  Patient can return home with the following  A little help with walking and/or transfers;A little help with bathing/dressing/bathroom;Assistance with cooking/housework;Assistance with feeding;Assist for transportation   Equipment Recommendations  BSC/3in1    Recommendations for Other Services      Precautions / Restrictions Precautions Precautions: Fall Restrictions Weight Bearing Restrictions: No       Mobility Bed Mobility Overal bed mobility: Modified Independent                  Transfers Overall transfer  level: Needs assistance Equipment used: Rolling walker (2 wheels) Transfers: Sit to/from Stand Sit to Stand: Supervision                 Balance Overall balance assessment: History of Falls, Needs assistance Sitting-balance support: No upper extremity supported, Feet supported Sitting balance-Leahy Scale: Good     Standing balance support: Bilateral upper extremity supported, Reliant on assistive device for balance Standing balance-Leahy Scale: Fair                             ADL either performed or assessed with clinical judgement   ADL Overall ADL's : Needs assistance/impaired                                       General ADL Comments: MOD A for LB access bed level - reports dizziness in sitting. CGA + RW for ADL t/f, tolerates ~10 sec standing, limited by dizziness.      Cognition Arousal/Alertness: Awake/alert Behavior During Therapy: WFL for tasks assessed/performed Overall Cognitive Status: Within Functional Limits for tasks assessed                                                General Comments SEATED BP: 115/61, MAP 75    Pertinent Vitals/ Pain       Pain Assessment Pain Assessment: 0-10 Pain Score: 7  Pain Location: neck  Pain Descriptors / Indicators: Grimacing, Guarding Pain Intervention(s): Limited activity within patient's tolerance, Repositioned, Premedicated before session   Frequency  Min 2X/week        Progress Toward Goals  OT Goals(current goals can now be found in the care plan section)  Progress towards OT goals: Progressing toward goals  Acute Rehab OT Goals Patient Stated Goal: to return to PLOF OT Goal Formulation: With patient Time For Goal Achievement: 10/27/21 Potential to Achieve Goals: Good ADL Goals Pt Will Perform Grooming: with supervision;sitting Pt Will Transfer to Toilet: ambulating;regular height toilet;with supervision Pt Will Perform Toileting - Clothing  Manipulation and hygiene: with supervision;sit to/from stand Pt Will Perform Tub/Shower Transfer: with supervision Pt/caregiver will Perform Home Exercise Program: Left upper extremity;With written HEP provided  Plan Discharge plan remains appropriate;Frequency remains appropriate    Co-evaluation                 AM-PAC OT "6 Clicks" Daily Activity     Outcome Measure   Help from another person eating meals?: None Help from another person taking care of personal grooming?: A Little Help from another person toileting, which includes using toliet, bedpan, or urinal?: A Little Help from another person bathing (including washing, rinsing, drying)?: A Little Help from another person to put on and taking off regular upper body clothing?: A Little Help from another person to put on and taking off regular lower body clothing?: A Lot 6 Click Score: 18    End of Session Equipment Utilized During Treatment: Oxygen;Rolling walker (2 wheels)  OT Visit Diagnosis: Unsteadiness on feet (R26.81) Pain - Right/Left: Left Pain - part of body: Hand   Activity Tolerance Patient tolerated treatment well   Patient Left in bed;with call bell/phone within reach   Nurse Communication Mobility status        Time: 4709-6283 OT Time Calculation (min): 24 min  Charges: OT General Charges $OT Visit: 1 Visit OT Treatments $Self Care/Home Management : 23-37 mins  Dessie Coma, M.S. OTR/L  10/17/21, 3:54 PM  ascom [phone]

## 2021-10-17 NOTE — ED Notes (Signed)
Patient given new sheet, pillow case, and pad. Bedside commode emptied and cleaned.

## 2021-10-17 NOTE — ED Notes (Signed)
Pt given sandwich tray 

## 2021-10-17 NOTE — ED Notes (Signed)
Report given to dialysis nurse. Meal tray arrived. Will send down to dialysis with patient

## 2021-10-17 NOTE — NC FL2 (Signed)
[place] LEVEL OF CARE SCREENING TOOL     IDENTIFICATION  Patient Name: Katie [name] Birthdate: 05/03/1956 Sex: female Admission Date (Current Location): 09/30/2021  [place] and [place] Number:  Engineering geologist and Address:  [facility], [address], [place], [place] [address]      Provider Number: [id number]  Attending Physician Name and Address:  Murlean Iba, MD  Relative Name and Phone Number:       Current Level of Care: Other (Comment) ([place], ALF) Recommended Level of Care: [place], [facility] Prior Approval Number:    Date Approved/Denied:   PASRR Number: [id number]  Discharge Plan: Other (Comment) (Family Care home/ ALF)    Current Diagnoses: Patient Active Problem List   Diagnosis Date Noted   Infected dog bite of hand including fingers, left, initial encounter 09/23/2021   Right flank pain 09/23/2021   Iron deficiency anemia, unspecified 03/27/2021   Allergy, unspecified, initial encounter 03/22/2021   Anaphylactic shock, unspecified, initial encounter 03/22/2021   Unspecified jaundice 03/15/2021   Allergy status to penicillin 03/14/2021   Anemia in chronic kidney disease 03/14/2021   Chronic diastolic (congestive) heart failure ([place]) 03/14/2021   Dependence on renal dialysis ([place]) 03/14/2021   Depression, unspecified 03/14/2021   Hypertensive heart and chronic kidney disease with heart failure and with stage 5 chronic kidney disease, or end stage renal disease ([place]) 03/14/2021   Personal history of nicotine dependence 03/14/2021   Secondary hyperparathyroidism of renal origin ([place]) 03/14/2021   Frequent falls 03/07/2021   Homelessness 03/07/2021   Financial difficulty 03/07/2021   ESRD (end stage renal disease) ([place]) 12/23/2020   Persistent headaches 06/27/2020   ATN (acute tubular necrosis) ([place])    Pulmonary hypertension, unspecified (HCC)    Chronic kidney disease  (CKD), stage IV (severe) (HCC)    Pleural effusion    Acute heart failure with preserved ejection fraction (HFpEF) ([place])    Pressure injury of skin 05/01/2020   Hyperkalemia    Volume overload 04/30/2020   Type 2 diabetes mellitus with stage 5 chronic kidney disease ([place]) 04/30/2020   Hypertension    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HCC)    Type 2 diabetes mellitus (HCC)     Orientation RESPIRATION BLADDER Height & Weight     Self, Time, Situation, Place  Normal Continent Weight: 83.4 kg Height:  5\' 6"  (167.6 cm)  BEHAVIORAL SYMPTOMS/MOOD NEUROLOGICAL BOWEL NUTRITION STATUS      Continent Diet (Regular diet)  AMBULATORY STATUS COMMUNICATION OF NEEDS Skin   Limited Assist Verbally Normal                       Personal Care Assistance Level of Assistance  Bathing, Feeding, Dressing Bathing Assistance: Limited assistance Feeding assistance: Independent Dressing Assistance: Limited assistance     Functional Limitations Info  Sight, Hearing, Speech Sight Info: Impaired Hearing Info: Adequate Speech Info: Adequate    SPECIAL CARE FACTORS FREQUENCY  PT (By licensed PT), OT (By licensed OT)     PT Frequency: home health OT Frequency: home health            Contractures Contractures Info: Not present    Additional Factors Info  Code Status, Allergies Code Status Info: Full Allergies Info: Penicillins           Current Medications (10/17/2021):  This is the current hospital active medication list Current Facility-Administered Medications  Medication Dose  Route Frequency Provider Last Rate Last Admin   acetaminophen (TYLENOL) tablet 500 mg  500 mg Oral Q6H PRN Vanessa [place], MD   500 mg at 10/13/21 2236   amLODipine (NORVASC) tablet 10 mg  10 mg Oral QPM Vanessa [place], MD   10 mg at 10/16/21 1832   calcium acetate (PHOSLO) capsule 667 mg  667 mg Oral TID WC Vanessa [place], MD   667 mg at 10/17/21 1420   carvedilol (COREG) tablet 12.5 mg  12.5 mg Oral  QPM Vanessa [place], MD   12.5 mg at 10/16/21 1832   Chlorhexidine Gluconate Cloth 2 % PADS 6 each  6 each Topical Q0600 Colon Flattery, NP   6 each at 10/05/21 1412   docusate sodium (COLACE) capsule 100 mg  100 mg Oral Once Lavonia Drafts, MD       docusate sodium (COLACE) capsule 100 mg  100 mg Oral Daily Lavonia Drafts, MD   100 mg at 10/17/21 1420   guaiFENesin (ROBITUSSIN) 100 MG/5ML liquid 10 mL  10 mL Oral Q4H PRN Murlean Iba, MD   10 mL at 10/17/21 1421   heparin sodium (porcine) 1000 UNIT/ML injection            ondansetron (ZOFRAN) injection 4 mg  4 mg Intravenous Once Nena Polio, MD       ondansetron (ZOFRAN-ODT) disintegrating tablet 4 mg  4 mg Oral Q8H PRN Murlean Iba, MD   4 mg at 10/17/21 1416   oxyCODONE-acetaminophen (PERCOCET/ROXICET) 5-325 MG per tablet 1 tablet  1 tablet Oral Q6H PRN Blake Divine, MD   1 tablet at 10/17/21 1416   tiZANidine (ZANAFLEX) tablet 4 mg  4 mg Oral BID PRN Vanessa , MD   4 mg at 10/15/21 1752   Current Outpatient Medications  Medication Sig Dispense Refill   acetaminophen (TYLENOL) 500 MG tablet Take 500 mg by mouth every 6 (six) hours as needed for moderate pain.     amLODipine (NORVASC) 10 MG tablet Take 10 mg by mouth every evening.     calcium acetate (PHOSLO) 667 MG capsule Take 1 capsule (667 mg total) by mouth 3 (three) times daily with meals.     carvedilol (COREG) 12.5 MG tablet Take 1 tablet (12.5 mg total) by mouth every evening.     lactulose (CHRONULAC) 10 GM/15ML solution Take 22.5 mLs (15 g total) by mouth daily. (Patient not taking: Reported on 09/24/2021) 236 mL 0   lidocaine (LIDODERM) 5 % Place 1 patch onto the skin daily. Remove & Discard patch within 12 hours or as directed by MD (Patient not taking: Reported on 09/30/2021) 30 patch 0   lidocaine-prilocaine (EMLA) cream Apply topically.     oxyCODONE-acetaminophen (PERCOCET/ROXICET) 5-325 MG tablet Take 1-2 tablets by mouth every 4 (four) hours as needed for  moderate pain or severe pain. 30 tablet 0   senna-docusate (SENOKOT-S) 8.6-50 MG tablet Take 1 tablet by mouth 2 (two) times daily.     tiZANidine (ZANAFLEX) 4 MG tablet Take 4 mg by mouth 2 (two) times daily as needed.     topiramate (TOPAMAX) 50 MG tablet Take 1 tablet (50 mg total) by mouth 2 (two) times daily as needed (headache).     traZODone (DESYREL) 50 MG tablet Take 1 tablet (50 mg total) by mouth every evening. 30 tablet 0   venlafaxine (EFFEXOR) 37.5 MG tablet Take 1 tablet (37.5 mg total) by mouth 2 (two) times daily with a meal. 60  tablet 0     Discharge Medications: Please see discharge summary for a list of discharge medications.  Relevant Imaging Results:  Relevant Lab Results:   Additional Information ss [id number]  Dialysis T [address]  Shelbie Hutching, RN

## 2021-10-17 NOTE — Progress Notes (Signed)
[facility]  ROUNDING NOTE   Subjective:   Patient is a 66 year old female with past medical history of ESRD with hemodialysis TTS, diabetes mellitus, hypertension to the hospital seeking long term placement. She was recently admitted and received antibiotics for a dog bite.  She was discharged with a prescription for home antibiotics.  She states her caregivers were unable to retrieve antibiotics.  She states that there was an argument and that she is here seeking long-term placement.  Patient receives outpatient dialysis treatments at [facility] on a TTS schedule.  Supervised by Dr. Holley Raring.    Update: Patient seen and evaluated during dialysis   HEMODIALYSIS FLOWSHEET:  Blood Flow Rate (mL/min): 400 mL/min Arterial Pressure (mmHg): -170 mmHg Venous Pressure (mmHg): 120 mmHg Transmembrane Pressure (mmHg): 60 mmHg Ultrafiltration Rate (mL/min): 570 mL/min Dialysate Flow Rate (mL/min): 500 ml/min Conductivity: 13.8 Conductivity: 13.8 Dialysis Fluid Bolus: Normal Saline Bolus Amount (mL): 250 mL  No complaints at this time, awaiting breakfast Pain managed by prescribed medications  Objective:  Vital signs in last 24 hours:  Temp:  [98.3 F (36.8 C)-98.5 F (36.9 C)] 98.5 F (36.9 C) (06/06 0836) Pulse Rate:  [62-72] 63 (06/06 1115) Resp:  [12-20] 16 (06/06 1115) BP: (105-123)/(37-65) 117/55 (06/06 1115) SpO2:  [98 %-100 %] 100 % (06/06 1115) Weight:  [86.9 kg] 86.9 kg (06/06 0836)  Weight change:  Filed Weights   10/12/21 1252 10/14/21 0817 10/17/21 0836  Weight: 80.6 kg 87.1 kg 86.9 kg    Intake/Output: No intake/output data recorded.   Intake/Output this shift:  No intake/output data recorded.  Physical Exam: General: No acute distress  Head: Normocephalic, atraumatic. Moist oral mucosal membranes  Eyes: Anicteric  Lungs:  Clear to auscultation, normal effort  Heart: S1S2 no rubs  Abdomen:  Soft, nontender, bowel sounds present   Extremities: No lower extremity edema or cyanosis.   Neurologic: Awake, alert, following commands  Skin: No acute rash,  Left third finger sutures  Access: R Permacath.  L AVG    Basic Metabolic Panel: Recent Labs  Lab 10/12/21 0908 10/17/21 0800  NA 134* 137  K 5.3* 4.8  CL 97* 101  CO2 28 28  GLUCOSE 172* 84  BUN 33* 35*  CREATININE 6.30* 6.41*  CALCIUM 8.3* 8.6*  PHOS 4.4 4.0     Liver Function Tests: Recent Labs  Lab 10/12/21 0908 10/17/21 0800  ALBUMIN 2.6* 2.6*    No results for input(s): LIPASE, AMYLASE in the last 168 hours.  No results for input(s): AMMONIA in the last 168 hours.  CBC: Recent Labs  Lab 10/12/21 0908 10/17/21 0800  WBC 14.2* 6.4  NEUTROABS 11.5*  --   HGB 9.5* 9.1*  HCT 31.2* 30.0*  MCV 97.8 97.4  PLT 248 255     Cardiac Enzymes: No results for input(s): CKTOTAL, CKMB, CKMBINDEX, TROPONINI in the last 168 hours.  BNP: Invalid input(s): POCBNP  CBG: No results for input(s): GLUCAP in the last 168 hours.   Microbiology: Results for orders placed or performed during the hospital encounter of 09/30/21  Resp Panel by RT-PCR (Flu A&B, Covid) Nasopharyngeal Swab     Status: None   Collection Time: 09/30/21  9:34 AM   Specimen: Nasopharyngeal Swab; Nasopharyngeal(NP) swabs in vial transport medium  Result Value Ref Range Status   SARS Coronavirus 2 by RT PCR NEGATIVE NEGATIVE Final    Comment: (NOTE) SARS-CoV-2 target nucleic acids are NOT DETECTED.  The SARS-CoV-2 RNA is generally detectable in upper  respiratory specimens during the acute phase of infection. The lowest concentration of SARS-CoV-2 viral copies this assay can detect is 138 copies/mL. A negative result does not preclude SARS-Cov-2 infection and should not be used as the sole basis for treatment or other patient management decisions. A negative result may occur with  improper specimen collection/handling, submission of specimen other than nasopharyngeal swab,  presence of viral mutation(s) within the areas targeted by this assay, and inadequate number of viral copies(<138 copies/mL). A negative result must be combined with clinical observations, patient history, and epidemiological information. The expected result is Negative.  Fact Sheet for Patients:  EntrepreneurPulse.com.au  Fact Sheet for Healthcare Providers:  IncredibleEmployment.be  This test is no t yet approved or cleared by the [place] FDA and  has been authorized for detection and/or diagnosis of SARS-CoV-2 by FDA under an Emergency Use Authorization (EUA). This EUA will remain  in effect (meaning this test can be used) for the duration of the COVID-19 declaration under Section 564(b)(1) of the Act, 21 U.S.C.section 360bbb-3(b)(1), unless the authorization is terminated  or revoked sooner.       Influenza A by PCR NEGATIVE NEGATIVE Final   Influenza B by PCR NEGATIVE NEGATIVE Final    Comment: (NOTE) The Xpert Xpress SARS-CoV-2/FLU/RSV plus assay is intended as an aid in the diagnosis of influenza from Nasopharyngeal swab specimens and should not be used as a sole basis for treatment. Nasal washings and aspirates are unacceptable for Xpert Xpress SARS-CoV-2/FLU/RSV testing.  Fact Sheet for Patients: EntrepreneurPulse.com.au  Fact Sheet for Healthcare Providers: IncredibleEmployment.be  This test is not yet approved or cleared by the [place] FDA and has been authorized for detection and/or diagnosis of SARS-CoV-2 by FDA under an Emergency Use Authorization (EUA). This EUA will remain in effect (meaning this test can be used) for the duration of the COVID-19 declaration under Section 564(b)(1) of the Act, 21 U.S.C. section 360bbb-3(b)(1), unless the authorization is terminated or revoked.  Performed at [facility], [address] [place], [place] [address]      Coagulation Studies:   Urinalysis: No results for input(s): COLORURINE, LABSPEC, PHURINE, GLUCOSEU, HGBUR, BILIRUBINUR, KETONESUR, PROTEINUR, UROBILINOGEN, NITRITE, LEUKOCYTESUR in the last 72 hours.  Invalid input(s): APPERANCEUR     Imaging: No results found.   Medications:       amLODipine  10 mg Oral QPM   calcium acetate  667 mg Oral TID WC   carvedilol  12.5 mg Oral QPM   Chlorhexidine Gluconate Cloth  6 each Topical Q0600   docusate sodium  100 mg Oral Once   docusate sodium  100 mg Oral Daily   heparin sodium (porcine)       ondansetron (ZOFRAN) IV  4 mg Intravenous Once   acetaminophen, guaiFENesin, ondansetron, oxyCODONE-acetaminophen, tiZANidine  Assessment/ Plan:  67 y.o. female with ESRD on HD TTS, diabetes type 2, hypertension presented to the hospital seeking long term placement.   Hyperkalemia- resolved with dialysis.   Homelessness (Z 59.0) Due to insurance, patient must reside in [place] to receive dialysis from a [facility] clinic. APS involved to determine discharge needs.   ESRD on hemodialysis on TTS. Uremia R39.2  Receiving treatment today, UF goal 1L as tolerated  Hypertension. Maintain pt on amlodipine and carvedilol. BP 113/52 during dialysis.  Anemia of chronic disease.  Hgb 9.1 today. Will monitor   Secondary hyperparathyroidism.   Lab Results  Component Value Date   PTH 70 (H) 05/12/2020   CALCIUM 8.6 (L)  10/17/2021   PHOS 4.0 10/17/2021    Calcium and phosphorus at goal.  7. Infected dog bite of hand including left finger. Completed antibiotics     LOS: 0 Kenny Stern 6/6/202311:38 AM

## 2021-10-17 NOTE — Progress Notes (Signed)
Hemodialysis Post Treatment Note  October 17, 2021  Patient presents for 3.5 hour treatment, with no acute concerns. CVC intact, flushes well. Initial start of treatment turbulent due to machine issues, which were swiftly resolved, only a minor delay in treatment.   Patient's CVC intact, able to maintain the prescribed BFR, targeted UF met. Patient is without concerns, having slept much of treatment. Patient to return to the ED, anticipating discharge to a facility to assist with care.

## 2021-10-17 NOTE — Progress Notes (Signed)
Physical Therapy Treatment Patient Details Name: Katie [name] MRN: [id number] DOB: 1955/10/20 Today's Date: 10/17/2021   History of Present Illness Pt is a 66 y.o. female presenting to hospital 5/20 with concerns for worsening L hand pain d/t animal bite (pt recently admitted 5/13-5/17 for same reason)--pt not taking antibiotics for known dog bite.  Pt's friend dropped her off at dialysis and refused to do any more (pt now homeless).  PMH includes ESRD on HD TTS, DM, htn, orthostatic hypotension, and h/o mandible surgery.    PT Comments    Pt agreeable to PT today, although reported being fatigued from dialysis this morning and earlier OT session upon arrival to room. Pt SpO2 at 95% at start of session on 2 lpm O2 via nasal cannula and dropped to 88% on room air during standing activities. Monitored SpO2 closely throughout session and had pt sit down when feeling out of breath. Pt completed supine and seated exercises followed by standing marching and sidestepping at bedside with RW and CGA. Ambulation limited by fatigue, only able to tolerate sidestepping at EOB with increased reliance on RW and SOB symptoms. Recommend SNF at discharge due to pt decreased activity tolerance and endurance, and strength deficits.    Recommendations for follow up therapy are one component of a multi-disciplinary discharge planning process, led by the attending physician.  Recommendations may be updated based on patient status, additional functional criteria and insurance authorization.  Follow Up Recommendations  Skilled nursing-short term rehab (<3 hours/day)     Assistance Recommended at Discharge Intermittent Supervision/Assistance  Patient can return home with the following A little help with walking and/or transfers;Assist for transportation;Help with stairs or ramp for entrance;A little help with bathing/dressing/bathroom;Assistance with cooking/housework   Equipment Recommendations       Recommendations  for Other Services       Precautions / Restrictions Precautions Precautions: Fall Restrictions Weight Bearing Restrictions: No     Mobility  Bed Mobility Overal bed mobility: Modified Independent Bed Mobility: Supine to Sit, Sit to Supine     Supine to sit: Modified independent (Device/Increase time) Sit to supine: Modified independent (Device/Increase time)   General bed mobility comments: Increased time to complete bed mobility.    Transfers Overall transfer level: Needs assistance Equipment used: Rolling walker (2 wheels) Transfers: Sit to/from Stand Sit to Stand: Min guard           General transfer comment: CGA with pt exerting increased effort to stand    Ambulation/Gait Ambulation/Gait assistance: Min guard Gait Distance (Feet): 20 Feet Assistive device: Rolling walker (2 wheels) Gait Pattern/deviations: Step-to pattern       General Gait Details: Pt completed side-stepping at bedside using RW. Pt felt more confident using RW than bilat SPCs. Pt reported feeling unsteady without good balance when stepping to the side. Pt SpO2 monitored during all standing activities with drop to 88% on room air.   Stairs             Wheelchair Mobility    Modified Rankin (Stroke Patients Only)       Balance Overall balance assessment: History of Falls, Needs assistance Sitting-balance support: No upper extremity supported, Feet supported Sitting balance-Leahy Scale: Good Sitting balance - Comments: supervision when sitting EOB, able to reach for RW without LOB   Standing balance support: Bilateral upper extremity supported Standing balance-Leahy Scale: Fair Standing balance comment: reported unsteadiness while sidestepping using RW, but no LOB  Cognition Arousal/Alertness: Awake/alert Behavior During Therapy: WFL for tasks assessed/performed Overall Cognitive Status: Within Functional Limits for tasks assessed                                           Exercises Other Exercises Other Exercises: Supine of bilat LEs ther-ex including AP, alt SLR; 10 reps ea Other Exercises: Sit to stand from bed using RW; Marching in place x10 ea; Sidestepping along EOB x2 bouts    General Comments General comments (skin integrity, edema, etc.): SEATED BP: 115/61, MAP 75      Pertinent Vitals/Pain Pain Assessment Faces Pain Scale: Hurts a little bit Pain Location: L shoulder (from the bite) Pain Descriptors / Indicators: Other (Comment) (having R shoulder and arm issues from the swelling from the dog bite) Pain Intervention(s): Limited activity within patient's tolerance, Monitored during session, Repositioned    Home Living                          Prior Function            PT Goals (current goals can now be found in the care plan section) Acute Rehab PT Goals Patient Stated Goal: improve mobility PT Goal Formulation: With patient Time For Goal Achievement: 10/27/21 Potential to Achieve Goals: Good Progress towards PT goals: Progressing toward goals    Frequency    Min 2X/week      PT Plan Current plan remains appropriate    Co-evaluation              AM-PAC PT "6 Clicks" Mobility   Outcome Measure  Help needed turning from your back to your side while in a flat bed without using bedrails?: None Help needed moving from lying on your back to sitting on the side of a flat bed without using bedrails?: None Help needed moving to and from a bed to a chair (including a wheelchair)?: A Little Help needed standing up from a chair using your arms (e.g., wheelchair or bedside chair)?: A Little Help needed to walk in hospital room?: A Little Help needed climbing 3-5 steps with a railing? : A Lot 6 Click Score: 19    End of Session Equipment Utilized During Treatment: Gait belt Activity Tolerance: Patient limited by fatigue (Pt reported mild dizziness in sitting,  fatigue due to dialysis earlier today.) Patient left: in bed;with call bell/phone within reach Nurse Communication: Mobility status PT Visit Diagnosis: Other abnormalities of gait and mobility (R26.89);Unsteadiness on feet (R26.81);Muscle weakness (generalized) (M62.81);History of falling (Z91.81);Pain Pain - Right/Left: Left Pain - part of body: Shoulder;Hand;Arm     Time: 1550-1606 PT Time Calculation (min) (ACUTE ONLY): 16 min  Charges:  $Therapeutic Exercise: 8-22 mins                     Raden Byington, SPT    Evangelina Delancey 10/17/2021, 4:50 PM

## 2021-10-17 NOTE — Progress Notes (Signed)
OT Cancellation Note  Patient Details Name: Katie [name] MRN: [id number] DOB: [birth year]   Cancelled Treatment:    Reason Eval/Treat Not Completed: Other (comment);Patient at procedure or test/ unavailable (pt out of room at HD. OT will re attempt as able.Shanon Payor, OTD OTR/L  10/17/21, 10:26 AM

## 2021-10-18 MED ORDER — DOCUSATE SODIUM 100 MG PO CAPS
100.0000 mg | ORAL_CAPSULE | Freq: Two times a day (BID) | ORAL | Status: DC
  Administered 2021-10-19 – 2021-10-26 (×14): 100 mg via ORAL
  Filled 2021-10-18 (×13): qty 1

## 2021-10-18 MED ORDER — POLYETHYLENE GLYCOL 3350 17 G PO PACK
17.0000 g | PACK | Freq: Every day | ORAL | Status: DC
  Administered 2021-10-18 – 2021-10-26 (×9): 17 g via ORAL
  Filled 2021-10-18 (×9): qty 1

## 2021-10-18 NOTE — ED Notes (Signed)
TOC placement 

## 2021-10-18 NOTE — ED Notes (Signed)
TOC Placement

## 2021-10-18 NOTE — ED Notes (Signed)
Pt assisted to the BSC.

## 2021-10-18 NOTE — Progress Notes (Signed)
PT Cancellation Note  Patient Details Name: Katie [name] MRN: [id number] DOB: August 30, 1955   Cancelled Treatment:    Reason Eval/Treat Not Completed: Other (comment). Treatment attempted, however pt reports increased abdominal discomfort and is fatigued from frequent bathroom trips this date. Requests to defer session. Will re-attempt next date if available.   Johnney Scarlata 10/18/2021, 3:26 PM Greggory Stallion, PT, DPT, GCS [phone]

## 2021-10-18 NOTE — ED Notes (Signed)
Pt assisted to Bed Side Commode

## 2021-10-19 LAB — CBC
HCT: 31 % — ABNORMAL LOW (ref 36.0–46.0)
Hemoglobin: 9.4 g/dL — ABNORMAL LOW (ref 12.0–15.0)
MCH: 29.2 pg (ref 26.0–34.0)
MCHC: 30.3 g/dL (ref 30.0–36.0)
MCV: 96.3 fL (ref 80.0–100.0)
Platelets: 251 10*3/uL (ref 150–400)
RBC: 3.22 MIL/uL — ABNORMAL LOW (ref 3.87–5.11)
RDW: 14.3 % (ref 11.5–15.5)
WBC: 7.8 10*3/uL (ref 4.0–10.5)
nRBC: 0 % (ref 0.0–0.2)

## 2021-10-19 LAB — RENAL FUNCTION PANEL
Albumin: 2.6 g/dL — ABNORMAL LOW (ref 3.5–5.0)
Anion gap: 8 (ref 5–15)
BUN: 32 mg/dL — ABNORMAL HIGH (ref 8–23)
CO2: 29 mmol/L (ref 22–32)
Calcium: 8.7 mg/dL — ABNORMAL LOW (ref 8.9–10.3)
Chloride: 100 mmol/L (ref 98–111)
Creatinine, Ser: 5.51 mg/dL — ABNORMAL HIGH (ref 0.44–1.00)
GFR, Estimated: 8 mL/min — ABNORMAL LOW (ref >60)
Glucose, Bld: 153 mg/dL — ABNORMAL HIGH (ref 70–99)
Phosphorus: 3.8 mg/dL (ref 2.5–4.6)
Potassium: 4.5 mmol/L (ref 3.5–5.1)
Sodium: 137 mmol/L (ref 135–145)

## 2021-10-19 MED ORDER — ALTEPLASE 2 MG IJ SOLR: 2.0000 mg | Freq: Once | INTRAMUSCULAR | Status: DC | PRN

## 2021-10-19 MED ORDER — HEPARIN SODIUM (PORCINE) 1000 UNIT/ML IJ SOLN
INTRAMUSCULAR | Status: AC
  Filled 2021-10-19: qty 10

## 2021-10-19 MED ORDER — HEPARIN SODIUM (PORCINE) 1000 UNIT/ML IJ SOLN
2000.0000 [IU] | Freq: Once | INTRAMUSCULAR | Status: AC
  Administered 2021-10-19: 2000 [IU] via INTRAVENOUS

## 2021-10-19 MED ORDER — LIDOCAINE HCL (PF) 1 % IJ SOLN
5.0000 mL | INTRAMUSCULAR | Status: DC | PRN
Start: 2021-10-19 — End: 2021-10-19

## 2021-10-19 MED ORDER — LIDOCAINE-PRILOCAINE 2.5-2.5 % EX CREA: 1.0000 "application " | TOPICAL_CREAM | CUTANEOUS | Status: DC | PRN

## 2021-10-19 MED ORDER — PENTAFLUOROPROP-TETRAFLUOROETH EX AERO: 1.0000 "application " | INHALATION_SPRAY | CUTANEOUS | Status: DC | PRN

## 2021-10-19 MED ORDER — HEPARIN SODIUM (PORCINE) 1000 UNIT/ML DIALYSIS: 1000.0000 [IU] | INTRAMUSCULAR | Status: DC | PRN

## 2021-10-19 NOTE — ED Notes (Signed)
PT/OT in room with pt.

## 2021-10-19 NOTE — ED Notes (Signed)
Report given to dialysis at this time. 

## 2021-10-19 NOTE — ED Notes (Signed)
Patient resting comfortably in bed at this time. No needs expressed to RN at this time.

## 2021-10-19 NOTE — ED Notes (Signed)
Pt back from dialysis and given lunch and remote. Pt denies any further needs.

## 2021-10-19 NOTE — Progress Notes (Signed)
[facility]  ROUNDING NOTE   Subjective:   Patient is a 66 year old female with past medical history of ESRD with hemodialysis TTS, diabetes mellitus, hypertension to the hospital seeking long term placement. She was recently admitted and received antibiotics for a dog bite.  She was discharged with a prescription for home antibiotics.  She states her caregivers were unable to retrieve antibiotics.  She states that there was an argument and that she is here seeking long-term placement.  Patient receives outpatient dialysis treatments at [facility] on a TTS schedule.  Supervised by Dr. Holley Raring.    Update: Patient seen and evaluated during dialysis   HEMODIALYSIS FLOWSHEET:  Blood Flow Rate (mL/min): 400 mL/min Arterial Pressure (mmHg): -170 mmHg Venous Pressure (mmHg): 130 mmHg Transmembrane Pressure (mmHg): 60 mmHg Ultrafiltration Rate (mL/min): 290 mL/min Dialysate Flow Rate (mL/min): 500 ml/min Conductivity: 13.8 Conductivity: 13.8 Dialysis Fluid Bolus: Normal Saline Bolus Amount (mL): 161 mL  No complications at this time Patient appears upset when speaking of discharge planning to possibly include homeless shelter.  Objective:  Vital signs in last 24 hours:  Temp:  [98.2 F (36.8 C)] 98.2 F (36.8 C) (06/08 0948) Pulse Rate:  [62-74] 62 (06/08 1130) Resp:  [10-21] 10 (06/08 1130) BP: (120-150)/(50-71) 124/62 (06/08 1130) SpO2:  [99 %-100 %] 100 % (06/08 1130) Weight:  [86.3 kg] 86.3 kg (06/08 0949)  Weight change:  Filed Weights   10/17/21 0836 10/17/21 1453 10/19/21 0949  Weight: 86.9 kg 83.4 kg 86.3 kg    Intake/Output: No intake/output data recorded.   Intake/Output this shift:  No intake/output data recorded.  Physical Exam: General: No acute distress  Head: Normocephalic, atraumatic. Moist oral mucosal membranes  Eyes: Anicteric  Lungs:  Clear to auscultation, normal effort  Heart: S1S2 no rubs  Abdomen:  Soft, nontender, bowel  sounds present  Extremities: trace lower extremity edema or cyanosis.   Neurologic: Awake, alert, following commands  Skin: No acute rash,  Left third finger sutures  Access: R Permacath.  L AVG    Basic Metabolic Panel: Recent Labs  Lab 10/17/21 0800 10/19/21 1004  NA 137 137  K 4.8 4.5  CL 101 100  CO2 28 29  GLUCOSE 84 153*  BUN 35* 32*  CREATININE 6.41* 5.51*  CALCIUM 8.6* 8.7*  PHOS 4.0 3.8     Liver Function Tests: Recent Labs  Lab 10/17/21 0800 10/19/21 1004  ALBUMIN 2.6* 2.6*    No results for input(s): "LIPASE", "AMYLASE" in the last 168 hours.  No results for input(s): "AMMONIA" in the last 168 hours.  CBC: Recent Labs  Lab 10/17/21 0800 10/19/21 1004  WBC 6.4 7.8  HGB 9.1* 9.4*  HCT 30.0* 31.0*  MCV 97.4 96.3  PLT 255 251     Cardiac Enzymes: No results for input(s): "CKTOTAL", "CKMB", "CKMBINDEX", "TROPONINI" in the last 168 hours.  BNP: Invalid input(s): "POCBNP"  CBG: No results for input(s): "GLUCAP" in the last 168 hours.   Microbiology: Results for orders placed or performed during the hospital encounter of 09/30/21  Resp Panel by RT-PCR (Flu A&B, Covid) Nasopharyngeal Swab     Status: None   Collection Time: 09/30/21  9:34 AM   Specimen: Nasopharyngeal Swab; Nasopharyngeal(NP) swabs in vial transport medium  Result Value Ref Range Status   SARS Coronavirus 2 by RT PCR NEGATIVE NEGATIVE Final    Comment: (NOTE) SARS-CoV-2 target nucleic acids are NOT DETECTED.  The SARS-CoV-2 RNA is generally detectable in upper respiratory specimens during  the acute phase of infection. The lowest concentration of SARS-CoV-2 viral copies this assay can detect is 138 copies/mL. A negative result does not preclude SARS-Cov-2 infection and should not be used as the sole basis for treatment or other patient management decisions. A negative result may occur with  improper specimen collection/handling, submission of specimen other than  nasopharyngeal swab, presence of viral mutation(s) within the areas targeted by this assay, and inadequate number of viral copies(<138 copies/mL). A negative result must be combined with clinical observations, patient history, and epidemiological information. The expected result is Negative.  Fact Sheet for Patients:  EntrepreneurPulse.com.au  Fact Sheet for Healthcare Providers:  IncredibleEmployment.be  This test is no t yet approved or cleared by the [place] FDA and  has been authorized for detection and/or diagnosis of SARS-CoV-2 by FDA under an Emergency Use Authorization (EUA). This EUA will remain  in effect (meaning this test can be used) for the duration of the COVID-19 declaration under Section 564(b)(1) of the Act, 21 U.S.C.section 360bbb-3(b)(1), unless the authorization is terminated  or revoked sooner.       Influenza A by PCR NEGATIVE NEGATIVE Final   Influenza B by PCR NEGATIVE NEGATIVE Final    Comment: (NOTE) The Xpert Xpress SARS-CoV-2/FLU/RSV plus assay is intended as an aid in the diagnosis of influenza from Nasopharyngeal swab specimens and should not be used as a sole basis for treatment. Nasal washings and aspirates are unacceptable for Xpert Xpress SARS-CoV-2/FLU/RSV testing.  Fact Sheet for Patients: EntrepreneurPulse.com.au  Fact Sheet for Healthcare Providers: IncredibleEmployment.be  This test is not yet approved or cleared by the [place] FDA and has been authorized for detection and/or diagnosis of SARS-CoV-2 by FDA under an Emergency Use Authorization (EUA). This EUA will remain in effect (meaning this test can be used) for the duration of the COVID-19 declaration under Section 564(b)(1) of the Act, 21 U.S.C. section 360bbb-3(b)(1), unless the authorization is terminated or revoked.  Performed at [facility], [address] [place], [place]  [address]     Coagulation Studies:   Urinalysis: No results for input(s): "COLORURINE", "LABSPEC", "PHURINE", "GLUCOSEU", "HGBUR", "BILIRUBINUR", "KETONESUR", "PROTEINUR", "UROBILINOGEN", "NITRITE", "LEUKOCYTESUR" in the last 72 hours.  Invalid input(s): "APPERANCEUR"     Imaging: No results found.   Medications:       amLODipine  10 mg Oral QPM   calcium acetate  667 mg Oral TID WC   carvedilol  12.5 mg Oral QPM   Chlorhexidine Gluconate Cloth  6 each Topical Q0600   docusate sodium  100 mg Oral BID   heparin sodium (porcine)       heparin sodium (porcine)  2,000 Units Intravenous Once   ondansetron (ZOFRAN) IV  4 mg Intravenous Once   polyethylene glycol  17 g Oral Daily   acetaminophen, alteplase, guaiFENesin, heparin, heparin sodium (porcine), lidocaine (PF), lidocaine-prilocaine, ondansetron, oxyCODONE-acetaminophen, pentafluoroprop-tetrafluoroeth, tiZANidine  Assessment/ Plan:  66 y.o. female with ESRD on HD TTS, diabetes type 2, hypertension presented to the hospital seeking long term placement.   Hyperkalemia- managed with dialysis  Homelessness (Z 59.0) Due to insurance, patient must reside in [place] to receive dialysis from a [place] [facility]. APS involved to assist in discharge planning. Currently seeking senior residence homes.   ESRD on hemodialysis on TTS. Uremia R39.2  Dialysis today, tolerating well. UF goal 1L as tolerated. Next treatment on Saturday.  Hypertension. Maintain pt on amlodipine and carvedilol. BP 113/52 during dialysis.  Anemia of chronic disease.  Hgb stable  for this patient  Secondary hyperparathyroidism.   Lab Results  Component Value Date   PTH 70 (H) 05/12/2020   CALCIUM 8.7 (L) 10/19/2021   PHOS 3.8 10/19/2021    Will continue to monitor bone minerals during this admission.  7. Infected dog bite of hand including left finger. Completed antibiotics     LOS: 0 Loni Delbridge 6/8/202311:45 AM

## 2021-10-19 NOTE — ED Notes (Signed)
Breakfast tray at bedside 

## 2021-10-19 NOTE — Progress Notes (Addendum)
Physical Therapy Treatment Patient Details Name: Katie [name] MRN: [id number] DOB: 30-Mar-1956 Today's Date: 10/19/2021   History of Present Illness Pt is a 66 y.o. female presenting to hospital 5/20 with concerns for worsening L hand pain d/t animal bite (pt recently admitted 5/13-5/17 for same reason)--pt not taking antibiotics for known dog bite.  Pt's friend dropped her off at dialysis and refused to do any more (pt now homeless).  PMH includes ESRD on HD TTS, DM, htn, orthostatic hypotension, and h/o mandible surgery.    PT Comments    Pt is making gradual progress towards goals with excess fatigue noted post dialysis. Urgent need to void. BSC obtained and pt able to transfer to [facility]. Little if any urine output. Pt politely declines further mobility efforts and request to return back to bed at end of session. Pt on 3L at rest with sats at 94%, decreases to 88% on RA with exertion and improves to 97% once O2 donned. Pt appears lonely and enjoys having this therapist for company during session. Will continue to attempt. Pt may benefit from treatment on non-dialysis day due to fatigue level.  Recommendations for follow up therapy are one component of a multi-disciplinary discharge planning process, led by the attending physician.  Recommendations may be updated based on patient status, additional functional criteria and insurance authorization.  Follow Up Recommendations  Skilled nursing-short term rehab (<3 hours/day)     Assistance Recommended at Discharge Intermittent Supervision/Assistance  Patient can return home with the following A little help with walking and/or transfers;Assist for transportation;Help with stairs or ramp for entrance;A little help with bathing/dressing/bathroom;Assistance with cooking/housework   Equipment Recommendations       Recommendations for Other Services       Precautions / Restrictions Precautions Precautions: Fall Restrictions Weight Bearing  Restrictions: No     Mobility  Bed Mobility Overal bed mobility: Modified Independent Bed Mobility: Supine to Sit, Sit to Supine     Supine to sit: Modified independent (Device/Increase time) Sit to supine: Modified independent (Device/Increase time)   General bed mobility comments: Increased time to complete bed mobility.    Transfers Overall transfer level: Needs assistance Equipment used: Rolling walker (2 wheels) Transfers: Bed to chair/wheelchair/BSC Sit to Stand: Min guard   Step pivot transfers: Min guard       General transfer comment: cues for hand placement. Able to step pivot to [facility]    Ambulation/Gait Ambulation/Gait assistance: Min guard Gait Distance (Feet): 5 Feet Assistive device: Rolling walker (2 wheels) Gait Pattern/deviations: Step-to pattern       General Gait Details: ambulated to [facility] and back to bed. RW used   Marine scientist Rankin (Stroke Patients Only)       Balance Overall balance assessment: History of Falls, Needs assistance Sitting-balance support: No upper extremity supported, Feet supported Sitting balance-Leahy Scale: Good     Standing balance support: Bilateral upper extremity supported Standing balance-Leahy Scale: Fair                              Cognition Arousal/Alertness: Awake/alert Behavior During Therapy: WFL for tasks assessed/performed Overall Cognitive Status: Within Functional Limits for tasks assessed  General Comments: slightly emotional this date due to medical status        Exercises Other Exercises Other Exercises: supine ther-ex performed on B LE including SLR and AP x 10 reps with supervision. No cues required for sequencing Other Exercises: Pt transferred to [facility] with supervision for hygiene.    General Comments        Pertinent Vitals/Pain Pain Assessment Pain Assessment: 0-10 Pain Score:  7  Pain Location: neck pain and L arm Pain Descriptors / Indicators: Discomfort, Dull, Guarding Pain Intervention(s): Limited activity within patient's tolerance    Home Living                          Prior Function            PT Goals (current goals can now be found in the care plan section) Acute Rehab PT Goals Patient Stated Goal: improve mobility PT Goal Formulation: With patient Time For Goal Achievement: 10/27/21 Potential to Achieve Goals: Good Progress towards PT goals: Progressing toward goals    Frequency    Min 2X/week      PT Plan Current plan remains appropriate    Co-evaluation              AM-PAC PT "6 Clicks" Mobility   Outcome Measure  Help needed turning from your back to your side while in a flat bed without using bedrails?: None Help needed moving from lying on your back to sitting on the side of a flat bed without using bedrails?: None Help needed moving to and from a bed to a chair (including a wheelchair)?: A Little Help needed standing up from a chair using your arms (e.g., wheelchair or bedside chair)?: A Little Help needed to walk in hospital room?: A Little Help needed climbing 3-5 steps with a railing? : A Lot 6 Click Score: 19    End of Session Equipment Utilized During Treatment: Gait belt Activity Tolerance: Patient limited by fatigue Patient left: in bed;with call bell/phone within reach Nurse Communication: Mobility status PT Visit Diagnosis: Other abnormalities of gait and mobility (R26.89);Unsteadiness on feet (R26.81);Muscle weakness (generalized) (M62.81);History of falling (Z91.81);Pain Pain - Right/Left: Left Pain - part of body: Shoulder;Hand;Arm     Time: 2330-0762 PT Time Calculation (min) (ACUTE ONLY): 27 min  Charges:  $Therapeutic Exercise: 8-22 mins $Therapeutic Activity: 8-22 mins                     Greggory Stallion, PT, DPT, GCS [phone]    Georgenia Salim 10/19/2021, 4:30 PM

## 2021-10-19 NOTE — Progress Notes (Signed)
Hemodialysis Post Treatment Note:   Tx date: 10/19/2021   Tx time: 3 hours     Access: Right CVC   UF Removed: 62 ML   Note:   Patient started treatment as ordered. Patient initial order was for 3hours and 30 minutes of treatment. Towards the end of treatment, with about 30 minutes left of treatment venous pressure increase, lines flushed with NS but venous pressure continue to increase. Patient had moderate size clot in the venous chamber affecting venous pressure. Colon Flattery, NP notified and ordered to terminate treatment. Patient tolerated treatment with no adverse effects. Patient hemodynamically stable, report given to ED  nurse and patient transported back to room.

## 2021-10-20 NOTE — Progress Notes (Signed)
Physical Therapy Treatment Patient Details Name: Katie [name] MRN: [id number] DOB: 02-03-1956 Today's Date: 10/20/2021   History of Present Illness Pt is a 66 y.o. female presenting to hospital 5/20 with concerns for worsening L hand pain d/t animal bite (pt recently admitted 5/13-5/17 for same reason)--pt not taking antibiotics for known dog bite.  Pt's friend dropped her off at dialysis and refused to do any more (pt now homeless).  PMH includes ESRD on HD TTS, DM, htn, orthostatic hypotension, and h/o mandible surgery.    PT Comments    Pt resting in bed upon PT arrival; agreeable to PT session.  During session pt modified independent with bed mobility; SBA with transfers using RW; and CGA to SBA ambulating 150 feet with RW use.  Pt's O2 sats 92% or greater on 2 L via nasal cannula with ambulation.  Will continue to focus on strengthening, balance, and progressive functional mobility during hospitalization.  D/t pt's progress with therapy, PT recommendations updated to HHPT (TOC notified).    Recommendations for follow up therapy are one component of a multi-disciplinary discharge planning process, led by the attending physician.  Recommendations may be updated based on patient status, additional functional criteria and insurance authorization.  Follow Up Recommendations  Home health PT     Assistance Recommended at Discharge PRN  Patient can return home with the following A little help with walking and/or transfers;A little help with bathing/dressing/bathroom;Assistance with cooking/housework;Assist for transportation;Help with stairs or ramp for entrance   Equipment Recommendations  Rolling walker (2 wheels)    Recommendations for Other Services       Precautions / Restrictions Precautions Precautions: Fall Precaution Comments: R IJ permcath; L UE AV fistula Restrictions Weight Bearing Restrictions: No     Mobility  Bed Mobility Overal bed mobility: Modified Independent Bed  Mobility: Supine to Sit, Sit to Supine     Supine to sit: Modified independent (Device/Increase time) Sit to supine: Modified independent (Device/Increase time)   General bed mobility comments: Increased time/effort to perform on own    Transfers Overall transfer level: Needs assistance Equipment used: Rolling walker (2 wheels) Transfers: Sit to/from Stand Sit to Stand: Supervision           General transfer comment: initial vc's for UE placement; pt's B feet initially too far forward when initially standing but pt self corrected on subsequent transfers    Ambulation/Gait Ambulation/Gait assistance: Min guard, Supervision Gait Distance (Feet): 150 Feet Assistive device: Rolling walker (2 wheels)   Gait velocity: decreased     General Gait Details: partial step through gait pattern; steady with RW use   Stairs             Wheelchair Mobility    Modified Rankin (Stroke Patients Only)       Balance Overall balance assessment: History of Falls, Needs assistance Sitting-balance support: No upper extremity supported, Feet supported Sitting balance-Leahy Scale: Good Sitting balance - Comments: steady sitting reaching within BOS   Standing balance support: Bilateral upper extremity supported, During functional activity, Reliant on assistive device for balance Standing balance-Leahy Scale: Good Standing balance comment: steady ambulating with RW use                            Cognition Arousal/Alertness: Awake/alert Behavior During Therapy: WFL for tasks assessed/performed Overall Cognitive Status:  (Oriented to person, place, month/year, and general situation)  Exercises Other Exercises Other Exercises: x5 sit to stands from bed (24 seconds) with RW use    General Comments       Pertinent Vitals/Pain Pain Assessment Pain Assessment: Faces Pain Score: 4  Pain Location: neck pain and  L arm Pain Descriptors / Indicators: Discomfort, Dull, Guarding Pain Intervention(s): Limited activity within patient's tolerance, Monitored during session, Repositioned    Home Living                          Prior Function            PT Goals (current goals can now be found in the care plan section) Acute Rehab PT Goals Patient Stated Goal: improve mobility PT Goal Formulation: With patient Time For Goal Achievement: 10/27/21 Potential to Achieve Goals: Good Progress towards PT goals: Progressing toward goals    Frequency    Min 2X/week      PT Plan Current plan remains appropriate    Co-evaluation              AM-PAC PT "6 Clicks" Mobility   Outcome Measure  Help needed turning from your back to your side while in a flat bed without using bedrails?: None Help needed moving from lying on your back to sitting on the side of a flat bed without using bedrails?: None Help needed moving to and from a bed to a chair (including a wheelchair)?: A Little Help needed standing up from a chair using your arms (e.g., wheelchair or bedside chair)?: A Little Help needed to walk in hospital room?: A Little Help needed climbing 3-5 steps with a railing? : A Little 6 Click Score: 20    End of Session Equipment Utilized During Treatment: Gait belt Activity Tolerance: Patient tolerated treatment well Patient left: in bed;with call bell/phone within reach;with bed alarm set Nurse Communication: Mobility status;Precautions PT Visit Diagnosis: Other abnormalities of gait and mobility (R26.89);Unsteadiness on feet (R26.81);Muscle weakness (generalized) (M62.81);History of falling (Z91.81);Pain Pain - Right/Left: Left Pain - part of body: Hand     Time: 5427-0623 PT Time Calculation (min) (ACUTE ONLY): 21 min  Charges:  $Therapeutic Exercise: 8-22 mins                     Emagene Merfeld, PT 10/20/21, 1:16 PM

## 2021-10-20 NOTE — ED Notes (Signed)
Pt reports increased pain, requesting pain medication and cough medicine.

## 2021-10-20 NOTE — TOC Progression Note (Signed)
Transition of Care [facility]) - Progression Note    Patient Details  Name: Katie [name] MRN: [id number] Date of Birth: 1955-09-01  [facility] CM/SW Contact  Shelbie Hutching, RN Phone Number: 10/20/2021, 2:15 PM  Clinical Narrative:    PT worked with patient today and has updated recommendation to home health PT.  TOC and Magda Paganini with DSS will be working on group home placement instead of SNF.     Expected Discharge Plan: Skilled Nursing Facility Barriers to Discharge: SNF Pending bed offer  Expected Discharge Plan and Services Expected Discharge Plan: [place]   Discharge Planning Services: CM Consult Post Acute Care Choice: [place] Living arrangements for the past 2 months: Single Family Home                 DME Arranged: N/A DME Agency: NA       HH Arranged: NA HH Agency: NA         Social Determinants of Health (SDOH) Interventions    Readmission Risk Interventions    09/27/2021    1:44 PM 09/26/2021   12:46 PM 03/08/2021    2:49 PM  Readmission Risk Prevention Plan  Transportation Screening  Complete Complete  Palliative Care Screening Not Applicable    Medication Review (RN Care Manager)  Complete Complete  HRI or Home Care Consult   Complete  SW Recovery Care/Counseling Consult  Complete   Palliative Care Screening  Not Applicable   [place]  Not Applicable Complete

## 2021-10-20 NOTE — ED Notes (Signed)
Pt in bed, helped pt to bedside commode, resps even and unlabored.

## 2021-10-20 NOTE — ED Provider Notes (Signed)
Today's Vitals   10/19/21 1718 10/19/21 1802 10/19/21 2345 10/20/21 0218  BP:  112/60    Pulse:  69    Resp:      Temp:      TempSrc:      SpO2:      Weight:      Height:      PainSc: 4   2  0-No pain   Body mass index is 30.71 kg/m.   No acute events overnight.  Still awaiting social work disposition.   Jayline Kilburg, Delice Bison, DO 10/20/21 [phone]

## 2021-10-20 NOTE — ED Notes (Signed)
Patient stated that she had to use the bathroom. Assisted patient to bedside commode.Patient voided and was given tissue and hand sanitizer. Patient back in bed. Placed blanket over patient.

## 2021-10-20 NOTE — ED Notes (Signed)
Pt in bed, pt reports decreased shoulder pain, states that the pain in her hand is about the same, pt continues to have swelling in L arm, palpable radial pulse, reports positive sensation to touch. Color/temp  wdl.

## 2021-10-20 NOTE — ED Notes (Signed)
Still Pending TOC Placement

## 2021-10-21 MED ORDER — HEPARIN SODIUM (PORCINE) 1000 UNIT/ML IJ SOLN
INTRAMUSCULAR | Status: AC
  Administered 2021-10-21: 4200 [IU]
  Filled 2021-10-21: qty 10

## 2021-10-21 MED ORDER — OXYCODONE-ACETAMINOPHEN 5-325 MG PO TABS
ORAL_TABLET | ORAL | Status: AC
  Filled 2021-10-21: qty 1

## 2021-10-21 NOTE — Progress Notes (Signed)
Hemodialysis Post Treatment Note:  Tx date:10/21/2021 Tx time:3 hours Access:right CVC UF Removed: 581ml  Note: HD treatment  completed. Tolerated well. No HD complications.

## 2021-10-21 NOTE — Progress Notes (Signed)
OT Cancellation Note  Patient Details Name: Abia Monaco MRN: [id number] DOB: 1956/04/02   Cancelled Treatment:    Reason Eval/Treat Not Completed: Patient at procedure or test/ unavailable. Pt off the floor for dialysis. OT to re-attempt as time allows.   Darleen Crocker, [place], OTR/L , CBIS ascom [phone]  10/21/21, 12:23 PM

## 2021-10-21 NOTE — Progress Notes (Signed)
[facility]  Dialysis Note   Subjective:   Seen and examined on hemodialysis treatment.  Feels much better. Tolerating dialysis well.   HEMODIALYSIS FLOWSHEET:  Blood Flow Rate (mL/min): 400 mL/min Arterial Pressure (mmHg): -160 mmHg Venous Pressure (mmHg): 120 mmHg Transmembrane Pressure (mmHg): 60 mmHg Ultrafiltration Rate (mL/min): 370 mL/min Dialysate Flow Rate (mL/min): 500 ml/min Conductivity: 14 Conductivity: 14 Dialysis Fluid Bolus: Normal Saline Bolus Amount (mL): 250 mL    Objective:  Vital signs in last 24 hours:  Temp:  [98.7 F (37.1 C)-98.8 F (37.1 C)] 98.8 F (37.1 C) (06/10 0859) Pulse Rate:  [71-79] 71 (06/10 0906) Resp:  [12-20] 18 (06/10 1130) BP: (123-136)/(51-90) 134/61 (06/10 1130) SpO2:  [94 %-98 %] 98 % (06/10 0906) Weight:  [80.5 kg] 80.5 kg (06/10 0859)  Weight change:  Filed Weights   10/17/21 1453 10/19/21 0949 10/21/21 0859  Weight: 83.4 kg 86.3 kg 80.5 kg    Intake/Output: No intake/output data recorded.   Intake/Output this shift:  No intake/output data recorded.  Physical Exam: General: NAD,   Head: Normocephalic, atraumatic. Moist oral mucosal membranes  Eyes: Anicteric, PERRL  Neck: Supple, trachea midline  Lungs:  Clear to auscultation  Heart: Regular rate and rhythm  Abdomen:  Soft, nontender,   Extremities:  peripheral edema.  Neurologic: Nonfocal, moving all four extremities  Skin: No lesions  Access:     Basic Metabolic Panel: Recent Labs  Lab 10/17/21 0800 10/19/21 1004  NA 137 137  K 4.8 4.5  CL 101 100  CO2 28 29  GLUCOSE 84 153*  BUN 35* 32*  CREATININE 6.41* 5.51*  CALCIUM 8.6* 8.7*  PHOS 4.0 3.8    Liver Function Tests: Recent Labs  Lab 10/17/21 0800 10/19/21 1004  ALBUMIN 2.6* 2.6*   No results for input(s): "LIPASE", "AMYLASE" in the last 168 hours. No results for input(s): "AMMONIA" in the last 168 hours.  CBC: Recent Labs  Lab 10/17/21 0800 10/19/21 1004  WBC 6.4  7.8  HGB 9.1* 9.4*  HCT 30.0* 31.0*  MCV 97.4 96.3  PLT 255 251    Cardiac Enzymes: No results for input(s): "CKTOTAL", "CKMB", "CKMBINDEX", "TROPONINI" in the last 168 hours.  BNP: Invalid input(s): "POCBNP"  CBG: No results for input(s): "GLUCAP" in the last 168 hours.  Microbiology: Results for orders placed or performed during the hospital encounter of 09/30/21  Resp Panel by RT-PCR (Flu A&B, Covid) Nasopharyngeal Swab     Status: None   Collection Time: 09/30/21  9:34 AM   Specimen: Nasopharyngeal Swab; Nasopharyngeal(NP) swabs in vial transport medium  Result Value Ref Range Status   SARS Coronavirus 2 by RT PCR NEGATIVE NEGATIVE Final    Comment: (NOTE) SARS-CoV-2 target nucleic acids are NOT DETECTED.  The SARS-CoV-2 RNA is generally detectable in upper respiratory specimens during the acute phase of infection. The lowest concentration of SARS-CoV-2 viral copies this assay can detect is 138 copies/mL. A negative result does not preclude SARS-Cov-2 infection and should not be used as the sole basis for treatment or other patient management decisions. A negative result may occur with  improper specimen collection/handling, submission of specimen other than nasopharyngeal swab, presence of viral mutation(s) within the areas targeted by this assay, and inadequate number of viral copies(<138 copies/mL). A negative result must be combined with clinical observations, patient history, and epidemiological information. The expected result is Negative.  Fact Sheet for Patients:  EntrepreneurPulse.com.au  Fact Sheet for Healthcare Providers:  IncredibleEmployment.be  This test is  no t yet approved or cleared by the [place] and  has been authorized for detection and/or diagnosis of SARS-CoV-2 by FDA under an Emergency Use Authorization (EUA). This EUA will remain  in effect (meaning this test can be used) for the duration of  the COVID-19 declaration under Section 564(b)(1) of the Act, 21 U.S.C.section 360bbb-3(b)(1), unless the authorization is terminated  or revoked sooner.       Influenza A by PCR NEGATIVE NEGATIVE Final   Influenza B by PCR NEGATIVE NEGATIVE Final    Comment: (NOTE) The Xpert Xpress SARS-CoV-2/FLU/RSV plus assay is intended as an aid in the diagnosis of influenza from Nasopharyngeal swab specimens and should not be used as a sole basis for treatment. Nasal washings and aspirates are unacceptable for Xpert Xpress SARS-CoV-2/FLU/RSV testing.  Fact Sheet for Patients: EntrepreneurPulse.com.au  Fact Sheet for Healthcare Providers: IncredibleEmployment.be  This test is not yet approved or cleared by the [place] FDA and has been authorized for detection and/or diagnosis of SARS-CoV-2 by FDA under an Emergency Use Authorization (EUA). This EUA will remain in effect (meaning this test can be used) for the duration of the COVID-19 declaration under Section 564(b)(1) of the Act, 21 U.S.C. section 360bbb-3(b)(1), unless the authorization is terminated or revoked.  Performed at [facility], [address] [place], [place] [address]     Coagulation Studies: No results for input(s): "LABPROT", "INR" in the last 72 hours.  Urinalysis: No results for input(s): "COLORURINE", "LABSPEC", "PHURINE", "GLUCOSEU", "HGBUR", "BILIRUBINUR", "KETONESUR", "PROTEINUR", "UROBILINOGEN", "NITRITE", "LEUKOCYTESUR" in the last 72 hours.  Invalid input(s): "APPERANCEUR"    Imaging: No results found.   Medications:     amLODipine  10 mg Oral QPM   calcium acetate  667 mg Oral TID WC   carvedilol  12.5 mg Oral QPM   Chlorhexidine Gluconate Cloth  6 each Topical Q0600   docusate sodium  100 mg Oral BID   heparin sodium (porcine)       ondansetron (ZOFRAN) IV  4 mg Intravenous Once   polyethylene glycol  17 g Oral Daily   acetaminophen,  guaiFENesin, heparin sodium (porcine), ondansetron, oxyCODONE-acetaminophen, tiZANidine  Assessment/ Plan:  [name] is a 66 y.o.  female with past medical history of ESRD with hemodialysis TTS, diabetes mellitus, hypertension to the hospital seeking long term placement. She was recently admitted and received antibiotics for a dog bite.  She was discharged with a prescription for home antibiotics.  She states her caregivers were unable to retrieve antibiotics.  She states that there was an argument and that she is here seeking long-term placement.  Active Problems:   ESRD (end stage renal disease) ([place])     End Stage Renal Disease on hemodialysis:   No results found for: "POTASSIUM" No intake or output data in the 24 hours ending 10/21/21 1145  2. Hypertension with chronic kidney disease:    BP 134/61   Pulse 71   Temp 98.8 F (37.1 C) (Oral)   Resp 18   Ht 5\' 6"  (1.676 m)   Wt 80.5 kg   SpO2 98%   BMI 28.64 kg/m   3. Anemia of chronic kidney disease/ kidney injury/chronic disease/acute blood loss:   Lab Results  Component Value Date   HGB 9.4 (L) 10/19/2021    4. Secondary Hyperparathyroidism:     Lab Results  Component Value Date   PTH 70 (H) 05/12/2020   CALCIUM 8.7 (L) 10/19/2021   PHOS 3.8 10/19/2021  Dialysis treatments. We will continue to monitor for electrolytes. Continue the Epogen protocol. Placement in progress.    LOS: 0 Lyla Son, MD [facility] 6/10/202311:45 AM

## 2021-10-21 NOTE — ED Notes (Signed)
Pt assisted to the BSC.

## 2021-10-21 NOTE — ED Provider Notes (Signed)
Emergency Medicine Observation Re-evaluation Note  Katie [name] is a 66 y.o. female currently in the emergency department.  No acute events overnight.  Physical Exam  BP 139/61 (BP Location: Right Arm)   Pulse 72   Temp 99.1 F (37.3 C) (Oral)   Resp 17   Ht 5\' 6"  (1.676 m)   Wt 82.1 kg   SpO2 97%   BMI 29.21 kg/m   ED Course / MDM   Lab work from 10/19/2021 reviewed showing reassuring CBC.  Unchanged renal function.  Plan  Current plan is for cement to an appropriate facility once available.  Zoi Devine is not under involuntary commitment.     Harvest Dark, MD 10/21/21 2318

## 2021-10-21 NOTE — ED Notes (Signed)
Dialysis called for report and preparing to come get her

## 2021-10-21 NOTE — ED Notes (Signed)
Pt taken to dialysis 

## 2021-10-21 NOTE — ED Notes (Signed)
Hospital meal provided. Pt ate 100% of meal.

## 2021-10-22 NOTE — ED Notes (Signed)
Pt c/o nausea and vomiting at this time.

## 2021-10-22 NOTE — ED Notes (Signed)
Pt assisted to bedside commode but was unable to urinate- will give pt medications once nausea medication has kicked in

## 2021-10-23 ENCOUNTER — Non-Acute Institutional Stay: Payer: Medicaid Other

## 2021-10-23 ENCOUNTER — Observation Stay: Payer: Medicaid Other

## 2021-10-23 DIAGNOSIS — E1122 Type 2 diabetes mellitus with diabetic chronic kidney disease: Secondary | ICD-10-CM | POA: Diagnosis present

## 2021-10-23 DIAGNOSIS — K5641 Fecal impaction: Secondary | ICD-10-CM | POA: Diagnosis not present

## 2021-10-23 DIAGNOSIS — J189 Pneumonia, unspecified organism: Secondary | ICD-10-CM | POA: Diagnosis present

## 2021-10-23 DIAGNOSIS — Z1152 Encounter for screening for COVID-19: Secondary | ICD-10-CM | POA: Diagnosis not present

## 2021-10-23 DIAGNOSIS — R4182 Altered mental status, unspecified: Secondary | ICD-10-CM | POA: Diagnosis not present

## 2021-10-23 DIAGNOSIS — I953 Hypotension of hemodialysis: Secondary | ICD-10-CM | POA: Diagnosis not present

## 2021-10-23 DIAGNOSIS — S61452A Open bite of left hand, initial encounter: Secondary | ICD-10-CM | POA: Diagnosis present

## 2021-10-23 DIAGNOSIS — L0390 Cellulitis, unspecified: Secondary | ICD-10-CM | POA: Diagnosis present

## 2021-10-23 DIAGNOSIS — Z751 Person awaiting admission to adequate facility elsewhere: Secondary | ICD-10-CM | POA: Diagnosis not present

## 2021-10-23 DIAGNOSIS — R509 Fever, unspecified: Secondary | ICD-10-CM | POA: Diagnosis not present

## 2021-10-23 DIAGNOSIS — N2581 Secondary hyperparathyroidism of renal origin: Secondary | ICD-10-CM | POA: Diagnosis present

## 2021-10-23 DIAGNOSIS — A419 Sepsis, unspecified organism: Secondary | ICD-10-CM | POA: Diagnosis present

## 2021-10-23 DIAGNOSIS — Z599 Problem related to housing and economic circumstances, unspecified: Secondary | ICD-10-CM | POA: Diagnosis not present

## 2021-10-23 DIAGNOSIS — Z6830 Body mass index (BMI) 30.0-30.9, adult: Secondary | ICD-10-CM | POA: Diagnosis not present

## 2021-10-23 DIAGNOSIS — F4323 Adjustment disorder with mixed anxiety and depressed mood: Secondary | ICD-10-CM | POA: Diagnosis not present

## 2021-10-23 DIAGNOSIS — D631 Anemia in chronic kidney disease: Secondary | ICD-10-CM | POA: Diagnosis present

## 2021-10-23 DIAGNOSIS — I120 Hypertensive chronic kidney disease with stage 5 chronic kidney disease or end stage renal disease: Secondary | ICD-10-CM | POA: Diagnosis present

## 2021-10-23 DIAGNOSIS — N186 End stage renal disease: Secondary | ICD-10-CM | POA: Diagnosis present

## 2021-10-23 DIAGNOSIS — E1151 Type 2 diabetes mellitus with diabetic peripheral angiopathy without gangrene: Secondary | ICD-10-CM | POA: Diagnosis present

## 2021-10-23 DIAGNOSIS — S52282A Bent bone of left ulna, initial encounter for closed fracture: Secondary | ICD-10-CM | POA: Diagnosis present

## 2021-10-23 DIAGNOSIS — J690 Pneumonitis due to inhalation of food and vomit: Secondary | ICD-10-CM | POA: Diagnosis present

## 2021-10-23 DIAGNOSIS — W540XXA Bitten by dog, initial encounter: Secondary | ICD-10-CM | POA: Diagnosis not present

## 2021-10-23 DIAGNOSIS — J45909 Unspecified asthma, uncomplicated: Secondary | ICD-10-CM | POA: Diagnosis present

## 2021-10-23 DIAGNOSIS — E669 Obesity, unspecified: Secondary | ICD-10-CM | POA: Diagnosis present

## 2021-10-23 DIAGNOSIS — L03114 Cellulitis of left upper limb: Secondary | ICD-10-CM | POA: Diagnosis present

## 2021-10-23 DIAGNOSIS — I10 Essential (primary) hypertension: Secondary | ICD-10-CM | POA: Diagnosis not present

## 2021-10-23 DIAGNOSIS — Z5900 Homelessness unspecified: Secondary | ICD-10-CM | POA: Diagnosis not present

## 2021-10-23 DIAGNOSIS — Z95828 Presence of other vascular implants and grafts: Secondary | ICD-10-CM | POA: Diagnosis not present

## 2021-10-23 DIAGNOSIS — Z992 Dependence on renal dialysis: Secondary | ICD-10-CM | POA: Diagnosis not present

## 2021-10-23 DIAGNOSIS — E875 Hyperkalemia: Secondary | ICD-10-CM | POA: Diagnosis not present

## 2021-10-23 DIAGNOSIS — Y95 Nosocomial condition: Secondary | ICD-10-CM | POA: Diagnosis present

## 2021-10-23 DIAGNOSIS — I350 Nonrheumatic aortic (valve) stenosis: Secondary | ICD-10-CM | POA: Diagnosis present

## 2021-10-23 LAB — CBC WITH DIFFERENTIAL/PLATELET
Abs Immature Granulocytes: 0.17 10*3/uL — ABNORMAL HIGH (ref 0.00–0.07)
Basophils Absolute: 0.1 10*3/uL (ref 0.0–0.1)
Basophils Relative: 0 %
Eosinophils Absolute: 0.1 10*3/uL (ref 0.0–0.5)
Eosinophils Relative: 0 %
HCT: 31.3 % — ABNORMAL LOW (ref 36.0–46.0)
Hemoglobin: 9.6 g/dL — ABNORMAL LOW (ref 12.0–15.0)
Immature Granulocytes: 1 %
Lymphocytes Relative: 6 %
Lymphs Abs: 1.4 10*3/uL (ref 0.7–4.0)
MCH: 29.4 pg (ref 26.0–34.0)
MCHC: 30.7 g/dL (ref 30.0–36.0)
MCV: 96 fL (ref 80.0–100.0)
Monocytes Absolute: 1.7 10*3/uL — ABNORMAL HIGH (ref 0.1–1.0)
Monocytes Relative: 7 %
Neutro Abs: 19.6 10*3/uL — ABNORMAL HIGH (ref 1.7–7.7)
Neutrophils Relative %: 86 %
Platelets: 250 10*3/uL (ref 150–400)
RBC: 3.26 MIL/uL — ABNORMAL LOW (ref 3.87–5.11)
RDW: 14.4 % (ref 11.5–15.5)
WBC: 22.9 10*3/uL — ABNORMAL HIGH (ref 4.0–10.5)
nRBC: 0 % (ref 0.0–0.2)

## 2021-10-23 LAB — COMPREHENSIVE METABOLIC PANEL
ALT: 14 U/L (ref 0–44)
AST: 15 U/L (ref 15–41)
Albumin: 2.7 g/dL — ABNORMAL LOW (ref 3.5–5.0)
Alkaline Phosphatase: 101 U/L (ref 38–126)
Anion gap: 5 (ref 5–15)
BUN: 31 mg/dL — ABNORMAL HIGH (ref 8–23)
CO2: 30 mmol/L (ref 22–32)
Calcium: 8.6 mg/dL — ABNORMAL LOW (ref 8.9–10.3)
Chloride: 98 mmol/L (ref 98–111)
Creatinine, Ser: 5.17 mg/dL — ABNORMAL HIGH (ref 0.44–1.00)
GFR, Estimated: 9 mL/min — ABNORMAL LOW (ref >60)
Glucose, Bld: 103 mg/dL — ABNORMAL HIGH (ref 70–99)
Potassium: 4.2 mmol/L (ref 3.5–5.1)
Sodium: 133 mmol/L — ABNORMAL LOW (ref 135–145)
Total Bilirubin: 0.7 mg/dL (ref 0.3–1.2)
Total Protein: 6.7 g/dL (ref 6.5–8.1)

## 2021-10-23 LAB — LACTIC ACID, PLASMA: Lactic Acid, Venous: 0.8 mmol/L (ref 0.5–1.9)

## 2021-10-23 LAB — SARS CORONAVIRUS 2 BY RT PCR: SARS Coronavirus 2 by RT PCR: NEGATIVE

## 2021-10-23 MED ORDER — ACETAMINOPHEN 650 MG RE SUPP
650.0000 mg | Freq: Four times a day (QID) | RECTAL | Status: DC | PRN
Start: 2021-10-23 — End: 2021-11-27

## 2021-10-23 MED ORDER — VANCOMYCIN HCL IN DEXTROSE 1-5 GM/200ML-% IV SOLN: 1000.0000 mg | Freq: Once | INTRAVENOUS | Status: DC

## 2021-10-23 MED ORDER — METOCLOPRAMIDE HCL 5 MG/ML IJ SOLN
10.0000 mg | Freq: Once | INTRAMUSCULAR | Status: AC
  Administered 2021-10-23: 10 mg via INTRAVENOUS
  Filled 2021-10-23: qty 2

## 2021-10-23 MED ORDER — VANCOMYCIN HCL 1750 MG/350ML IV SOLN
1750.0000 mg | Freq: Once | INTRAVENOUS | Status: AC
  Administered 2021-10-23: 1750 mg via INTRAVENOUS
  Filled 2021-10-23: qty 350

## 2021-10-23 MED ORDER — HEPARIN SODIUM (PORCINE) 5000 UNIT/ML IJ SOLN
5000.0000 [IU] | Freq: Two times a day (BID) | INTRAMUSCULAR | Status: DC
  Administered 2021-10-24 – 2021-11-27 (×65): 5000 [IU] via SUBCUTANEOUS
  Filled 2021-10-23 (×66): qty 1

## 2021-10-23 MED ORDER — SODIUM CHLORIDE 0.9 % IV SOLN
2.0000 g | INTRAVENOUS | Status: AC
  Administered 2021-10-24 – 2021-10-28 (×2): 2 g via INTRAVENOUS
  Filled 2021-10-23: qty 2
  Filled 2021-10-23 (×3): qty 12.5
  Filled 2021-10-23: qty 2

## 2021-10-23 MED ORDER — ACETAMINOPHEN 325 MG PO TABS
650.0000 mg | ORAL_TABLET | Freq: Four times a day (QID) | ORAL | Status: DC | PRN
  Administered 2021-10-24: 650 mg via ORAL
  Filled 2021-10-23: qty 2

## 2021-10-23 MED ORDER — MORPHINE SULFATE (PF) 2 MG/ML IV SOLN: 2.0000 mg | INTRAVENOUS | Status: DC | PRN

## 2021-10-23 MED ORDER — HYDROCODONE-ACETAMINOPHEN 5-325 MG PO TABS
1.0000 | ORAL_TABLET | ORAL | Status: DC | PRN
  Administered 2021-10-29: 1 via ORAL
  Filled 2021-10-23: qty 1

## 2021-10-23 MED ORDER — VANCOMYCIN HCL IN DEXTROSE 1-5 GM/200ML-% IV SOLN
1000.0000 mg | INTRAVENOUS | Status: DC
  Administered 2021-10-24 – 2021-10-26 (×2): 1000 mg via INTRAVENOUS
  Filled 2021-10-23 (×4): qty 200

## 2021-10-23 MED ORDER — METOCLOPRAMIDE HCL 5 MG/ML IJ SOLN: 10.0000 mg | Freq: Once | INTRAMUSCULAR | Status: DC

## 2021-10-23 MED ORDER — SODIUM CHLORIDE 0.9% FLUSH
3.0000 mL | Freq: Two times a day (BID) | INTRAVENOUS | Status: DC
  Administered 2021-10-24 – 2021-11-18 (×48): 3 mL via INTRAVENOUS

## 2021-10-23 MED ORDER — LACTATED RINGERS IV SOLN: INTRAVENOUS | Status: AC

## 2021-10-23 MED ORDER — SODIUM CHLORIDE 0.9 % IV SOLN
2.0000 g | Freq: Once | INTRAVENOUS | Status: AC
  Administered 2021-10-23: 2 g via INTRAVENOUS
  Filled 2021-10-23: qty 12.5

## 2021-10-23 MED ORDER — PANTOPRAZOLE SODIUM 40 MG IV SOLR
40.0000 mg | Freq: Two times a day (BID) | INTRAVENOUS | Status: DC
  Administered 2021-10-24 (×2): 40 mg via INTRAVENOUS
  Filled 2021-10-23 (×2): qty 10

## 2021-10-23 MED ORDER — TOPIRAMATE 25 MG PO TABS
50.0000 mg | ORAL_TABLET | Freq: Two times a day (BID) | ORAL | Status: DC | PRN
  Administered 2021-11-06: 50 mg via ORAL
  Filled 2021-10-23 (×2): qty 2

## 2021-10-23 NOTE — Progress Notes (Signed)
OT Cancellation Note  Patient Details Name: Katie [name] MRN: [id number] DOB: [birth year]   Cancelled Treatment:    Reason Eval/Treat Not Completed: Patient declined, no reason specified. Pt declines OT this morning due to nausea. Pt requesting OT return after lunch and before 2pm TV show for therapy.   Ardeth Perfect., MPH, MS, OTR/L ascom [phone] 10/23/21, 11:35 AM

## 2021-10-23 NOTE — Progress Notes (Signed)
OT Cancellation Note  Patient Details Name: Maya Arcand MRN: [id number] DOB: [birth year]   Cancelled Treatment:    Reason Eval/Treat Not Completed: Other (comment). Upon 2nd attempt, RN with pt noting pt has a fever. Pt continues to endorse not feeling well. Will re-attempt OT tx next date.   Ardeth Perfect., MPH, MS, OTR/L ascom [phone] 10/23/21, 1:57 PM

## 2021-10-23 NOTE — Progress Notes (Signed)
PT Cancellation Note  Patient Details Name: Katie [name] MRN: [id number] DOB: 1955/09/16   Cancelled Treatment:    Reason Eval/Treat Not Completed: Other (comment) Per OT, RN with pt noting pt has a fever and pt not feeling well. Encouraged PT to hold today's session. Will re-attempt PT tx next date.   Eugina Row, SPT  Worley Radermacher 10/23/2021, 2:03 PM

## 2021-10-23 NOTE — ED Provider Notes (Signed)
5:55 PM Assumed care for off going team.   Blood pressure 128/61, pulse 79, temperature (!) 100.4 F (38 C), temperature source Oral, resp. rate 19, height 5\' 6"  (1.676 m), weight 82.1 kg, SpO2 98 %.  See their HPI for full report but in brief patient was noted to be febrile.  CBC shows significantly elevated white count of 22,000.  CMP shows normal potassium but known ESRD.  COVID-negative.  Chest x-ray was concerning for pneumonia.  I reevaluated the hand and she is got no obvious redness noted on the hand but she does still have some decreased movement of it but she does report some increasing cough so I suspect that the fever is more likely related to the pneumonia.  Patient now meets sepsis criteria given elevated white count and fever therefore will discuss with hospital team for admission.  .Critical Care  Performed by: Vanessa La Center, MD Authorized by: Vanessa Mulat, MD   Critical care provider statement:    Critical care time (minutes):  30   Critical care was necessary to treat or prevent imminent or life-threatening deterioration of the following conditions:  Sepsis   Critical care was time spent personally by me on the following activities:  Development of treatment plan with patient or surrogate, discussions with consultants, evaluation of patient's response to treatment, examination of patient, ordering and review of laboratory studies, ordering and review of radiographic studies, ordering and performing treatments and interventions, pulse oximetry, re-evaluation of patient's condition and review of old charts   Patient will be admitted for sepsis and hospital-acquired pneumonia    Vanessa Martin, MD 10/23/21 1800

## 2021-10-23 NOTE — ED Provider Notes (Signed)
I was notified by nursing that patient has a temp of 100.4.  Repeat vitals showing respiratory rate of 22 blood pressure is okay she is not tachycardic maintained on nasal cannula which is chronic for her.  I assessed the patient tells me she is generally just not feeling well has not felt well for several days she says.  Complains of nausea no significant abdominal pain denies urinary symptoms has felt hot and cold.  Cough is chronic for her she has no increased shortness of breath no chest pain.  Pain and new set of labs including CBC CMP lactate blood cultures.  Patient is on dialysis so is at high risk for bacteremia especially since has been in the hospital for so long.  We will also obtain a chest x-ray and urine sample.  She will likely need admission.  Patient's chest x-ray could represent multifocal pneumonia.  She is pending labs.  Will cover for HCAP with cefepime and vancomycin.  Patient signed out to oncoming provider pending labs will likely need admission.   Rada Hay, MD 10/23/21 [phone]

## 2021-10-23 NOTE — ED Notes (Signed)
Patient is resting in bed. Introduced myself to patient. No needs at this time.

## 2021-10-23 NOTE — Consult Note (Signed)
PHARMACY -  BRIEF ANTIBIOTIC NOTE   Pharmacy has received consult(s) for vancomycin and cefepime from an ED provider.  The patient's profile has been reviewed for ht/wt/allergies/indication/available labs.    One time order(s) placed for vancomycin 1750 mg and cefepime 2 g  Further antibiotics/pharmacy consults should be ordered by admitting physician if indicated.                       Thank you, Darnelle Bos 10/23/2021  4:10 PM

## 2021-10-23 NOTE — H&P (Signed)
History and Physical    Patient: Katie [name] WUJ:[id number] DOB: 02-26-1956 DOA: 09/30/2021 DOS: the patient was seen and examined on 10/24/2021 PCP: Pcp, No  Patient coming from:  ED stay since 09/30/2021.  Chief Complaint:  Chief Complaint  Patient presents with   Animal Bite   HPI: Katie [name] is a 66 y.o. female with medical history significant of ESRD on CKD, DM II, here from 09/30/2021 for homelessness requested admission for sepsis.Her left hand is swollen and painful with mild limitation in ROM.pt has not home and because she does not have a way to get dialysis she has been in ed. Chest xray shows pneumonia and ct imaging showed ulnar bone.  Review of Systems: unable to review all systems due to the inability of the patient to answer questions. Past Medical History:  Diagnosis Date   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[place])    Hypertension    Renal disorder    Type 2 diabetes mellitus ([place])    Past Surgical History:  Procedure Laterality Date   APPENDECTOMY     AV FISTULA PLACEMENT Left 03/16/2021   Procedure: INSERTION OF ARTERIOVENOUS (AV) GORE-TEX GRAFT ARM;  Surgeon: Algernon Huxley, MD;  Location: [facility];  Service: Vascular;  Laterality: Left;   DIALYSIS/PERMA CATHETER INSERTION N/A 05/16/2020   Procedure: DIALYSIS/PERMA CATHETER INSERTION;  Surgeon: Algernon Huxley, MD;  Location: [facility];  Service: Cardiovascular;  Laterality: N/A;   MANDIBLE SURGERY     RENAL BIOPSY Right    RIGHT HEART CATH N/A 05/10/2020   Procedure: RIGHT HEART CATH;  Surgeon: Nelva Bush, MD;  Location: [facility];  Service: Cardiovascular;  Laterality: N/A;   TEMPORARY DIALYSIS CATHETER N/A 05/11/2020   Procedure: TEMPORARY DIALYSIS CATHETER;  Surgeon: Algernon Huxley, MD;  Location: [facility];  Service: Cardiovascular;  Laterality: N/A;   Social History:  reports that she has quit smoking. Her smoking use included cigarettes. She has never used smokeless  tobacco. She reports that she does not currently use alcohol. She reports that she does not use drugs.  Allergies  Allergen Reactions   Penicillins Nausea And Vomiting    Pt states "only pills" make her vomit Other reaction(s): Nausea/Vomit    Family History  Problem Relation Age of Onset   Heart attack Mother    Asthma Mother    Uterine cancer Mother    Skin cancer Father    Prostate cancer Father    CAD Father    Parkinson's disease Father    Lung cancer Brother     Prior to Admission medications   Medication Sig Start Date End Date Taking? Authorizing Provider  acetaminophen (TYLENOL) 500 MG tablet Take 500 mg by mouth every 6 (six) hours as needed for moderate pain.    [provider]  amLODipine (NORVASC) 10 MG tablet Take 10 mg by mouth every evening. 07/25/21   [provider]  calcium acetate (PHOSLO) 667 MG capsule Take 1 capsule (667 mg total) by mouth 3 (three) times daily with meals. 03/21/21   Sidney Ace, MD  carvedilol (COREG) 12.5 MG tablet Take 1 tablet (12.5 mg total) by mouth every evening. 03/21/21   Sidney Ace, MD  lactulose (CHRONULAC) 10 GM/15ML solution Take 22.5 mLs (15 g total) by mouth daily. Patient not taking: Reported on 09/24/2021 03/21/21   Ralene Muskrat B, MD  lidocaine (LIDODERM) 5 % Place 1 patch onto the skin daily. Remove &  Discard patch within 12 hours or as directed by MD Patient not taking: Reported on 09/30/2021 03/21/21   Sidney Ace, MD  lidocaine-prilocaine (EMLA) cream Apply topically. 06/17/21   [provider]  oxyCODONE-acetaminophen (PERCOCET/ROXICET) 5-325 MG tablet Take 1-2 tablets by mouth every 4 (four) hours as needed for moderate pain or severe pain. 09/27/21   Lorella Nimrod, MD  senna-docusate (SENOKOT-S) 8.6-50 MG tablet Take 1 tablet by mouth 2 (two) times daily. 03/21/21   Sidney Ace, MD  tiZANidine (ZANAFLEX) 4 MG tablet Take 4 mg by mouth 2 (two) times daily as needed.  06/19/21   [provider]  topiramate (TOPAMAX) 50 MG tablet Take 1 tablet (50 mg total) by mouth 2 (two) times daily as needed (headache). 03/21/21   Sidney Ace, MD  traZODone (DESYREL) 50 MG tablet Take 1 tablet (50 mg total) by mouth every evening. 07/02/20 08/01/20  Nolberto Hanlon, MD  venlafaxine (EFFEXOR) 37.5 MG tablet Take 1 tablet (37.5 mg total) by mouth 2 (two) times daily with a meal. 07/02/20 08/01/20  Nolberto Hanlon, MD    Physical Exam: Vitals:   10/23/21 1935 10/23/21 1945 10/23/21 2323 10/24/21 0015  BP: 127/61  (!) 111/52   Pulse: 78 79 68   Resp: 18 (!) 23 18   Temp: 98.4 F (36.9 C)  98.4 F (36.9 C)   TempSrc: Oral  Oral   SpO2: (!) 3% 93% 96%   Weight:      Height:    5\' 6"  (1.676 m)  Physical Exam Vitals and nursing note reviewed.  Constitutional:      General: She is not in acute distress.    Appearance: Normal appearance. She is not ill-appearing, toxic-appearing or diaphoretic.  HENT:     Head: Normocephalic and atraumatic.     Right Ear: Hearing and external ear normal.     Left Ear: Hearing and external ear normal.     Nose: Nose normal. No nasal deformity.     Mouth/Throat:     Lips: Pink.     Mouth: Mucous membranes are moist.     Tongue: No lesions.     Pharynx: Oropharynx is clear.  Eyes:     Extraocular Movements: Extraocular movements intact.     Pupils: Pupils are equal, round, and reactive to light.  Neck:     Vascular: No carotid bruit.  Cardiovascular:     Rate and Rhythm: Normal rate and regular rhythm.     Pulses: Normal pulses.     Heart sounds: Normal heart sounds.  Pulmonary:     Effort: Pulmonary effort is normal.     Breath sounds: Normal breath sounds.  Abdominal:     General: Bowel sounds are normal. There is no distension.     Palpations: Abdomen is soft. There is no mass.     Tenderness: There is no abdominal tenderness. There is no guarding.     Hernia: No hernia is present.  Musculoskeletal:        General:  Swelling present.     Right lower leg: No edema.     Left lower leg: No edema.  Skin:    General: Skin is warm.  Neurological:     General: No focal deficit present.     Mental Status: She is alert and oriented to person, place, and time.     Cranial Nerves: Cranial nerves 2-12 are intact.     Motor: Motor function is intact.  Psychiatric:  Attention and Perception: Attention normal.        Mood and Affect: Mood normal.        Speech: Speech normal.        Behavior: Behavior normal. Behavior is cooperative.        Cognition and Memory: Cognition normal.     Data Reviewed: Results for orders placed or performed during the hospital encounter of 09/30/21 (from the past 24 hour(s))  Comprehensive metabolic panel     Status: Abnormal   Collection Time: 10/23/21  4:05 PM  Result Value Ref Range   Sodium 133 (L) 135 - 145 mmol/L   Potassium 4.2 3.5 - 5.1 mmol/L   Chloride 98 98 - 111 mmol/L   CO2 30 22 - 32 mmol/L   Glucose, Bld 103 (H) 70 - 99 mg/dL   BUN 31 (H) 8 - 23 mg/dL   Creatinine, Ser 5.17 (H) 0.44 - 1.00 mg/dL   Calcium 8.6 (L) 8.9 - 10.3 mg/dL   Total Protein 6.7 6.5 - 8.1 g/dL   Albumin 2.7 (L) 3.5 - 5.0 g/dL   AST 15 15 - 41 U/L   ALT 14 0 - 44 U/L   Alkaline Phosphatase 101 38 - 126 U/L   Total Bilirubin 0.7 0.3 - 1.2 mg/dL   GFR, Estimated 9 (L) >60 mL/min   Anion gap 5 5 - 15  CBC with Differential     Status: Abnormal   Collection Time: 10/23/21  4:05 PM  Result Value Ref Range   WBC 22.9 (H) 4.0 - 10.5 K/uL   RBC 3.26 (L) 3.87 - 5.11 MIL/uL   Hemoglobin 9.6 (L) 12.0 - 15.0 g/dL   HCT 31.3 (L) 36.0 - 46.0 %   MCV 96.0 80.0 - 100.0 fL   MCH 29.4 26.0 - 34.0 pg   MCHC 30.7 30.0 - 36.0 g/dL   RDW 14.4 11.5 - 15.5 %   Platelets 250 150 - 400 K/uL   nRBC 0.0 0.0 - 0.2 %   Neutrophils Relative % 86 %   Neutro Abs 19.6 (H) 1.7 - 7.7 K/uL   Lymphocytes Relative 6 %   Lymphs Abs 1.4 0.7 - 4.0 K/uL   Monocytes Relative 7 %   Monocytes Absolute 1.7 (H)  0.1 - 1.0 K/uL   Eosinophils Relative 0 %   Eosinophils Absolute 0.1 0.0 - 0.5 K/uL   Basophils Relative 0 %   Basophils Absolute 0.1 0.0 - 0.1 K/uL   Immature Granulocytes 1 %   Abs Immature Granulocytes 0.17 (H) 0.00 - 0.07 K/uL  Lactic acid, plasma     Status: None   Collection Time: 10/23/21  4:05 PM  Result Value Ref Range   Lactic Acid, Venous 0.8 0.5 - 1.9 mmol/L  SARS Coronavirus 2 by RT PCR (hospital order, performed in [place] hospital lab) *cepheid single result test* Anterior Nasal Swab     Status: None   Collection Time: 10/23/21  4:06 PM   Specimen: Anterior Nasal Swab  Result Value Ref Range   SARS Coronavirus 2 by RT PCR NEGATIVE NEGATIVE  Lactic acid, plasma     Status: None   Collection Time: 10/23/21 11:38 PM  Result Value Ref Range   Lactic Acid, Venous 1.3 0.5 - 1.9 mmol/L  Creatinine, serum     Status: Abnormal   Collection Time: 10/23/21 11:38 PM  Result Value Ref Range   Creatinine, Ser 5.41 (H) 0.44 - 1.00 mg/dL   GFR, Estimated 8 (L) >60  mL/min  Type and screen     Status: None (Preliminary result)   Collection Time: 10/23/21 11:38 PM  Result Value Ref Range   ABO/RH(D) O POS    Antibody Screen POS    Sample Expiration      10/26/2021,2359 Performed at [facility], [address] [place], [place] [address]    Antibody Identification PENDING      Assessment and Plan: * Cellulitis Cellulitis of left hand and septic arthropathy and ulnar fracture and pneumonia.  orthopedic consult in am.   Infected dog bite of hand including fingers, left, initial encounter Continue cefepime and vancomycin.   Hypertension Blood pressure (!) 111/52, pulse 68, temperature 98.4 F (36.9 C), temperature source Oral, resp. rate 18, height 5\' 6"  (1.676 m), weight 82.1 kg, SpO2 96 %.  Vitals:   10/21/21 1145 10/21/21 1200 10/21/21 1206 10/21/21 1215  BP: (!) 115/50 126/68 (!) 117/56 140/63   10/21/21 1802 10/22/21 2106 10/23/21 1344 10/23/21 1500   BP: 139/61 139/63 126/61 128/60   10/23/21 1530 10/23/21 1600 10/23/21 1935 10/23/21 2323  BP: 126/60 128/61 127/61 (!) 111/52   Continue coreg and stop amlodipine.   Bent bone fracture of ulna Ulnar bone fracture or septic arthropathy. orthopedic consult per am team  Cont current abx.   CAP (community acquired pneumonia) We will continue pt on cefepime and vancomycin. Supplemental oxygen as needed.   Sepsis ([place]) Pt meets sepsis criteria and is stable otherwise.  Cont iv abx regimen with vancomycina fnd fefepiem   Anemia in chronic kidney disease    Latest Ref Rng & Units 10/23/2021    4:05 PM 10/19/2021   10:04 AM 10/17/2021    8:00 AM  CBC  WBC 4.0 - 10.5 K/uL 22.9  7.8  6.4   Hemoglobin 12.0 - 15.0 g/dL 9.6  9.4  9.1   Hematocrit 36.0 - 46.0 % 31.3  31.0  30.0   Platelets 150 - 400 K/uL 250  251  255    Stable and due to ackd.   Financial difficulty Pt needs d/c once sepsis/ pneumonia and joint infection possibly resolved.    ESRD (end stage renal disease) [facility]) Nephrology following.  Dr Joylene John consulted by [facility] when pt was staying in ed.   Type 2 diabetes mellitus ([facility]) Glycemic protocol.       Advance Care Planning:    Code Status: Full Code   Consults:  Nephrology: Dr.Korrapti.   Family Communication:  [phone] (Home Phone)  [phone] (Mobile)  Severity of Illness: The appropriate patient status for this patient is INPATIENT. Inpatient status is judged to be reasonable and necessary in order to provide the required intensity of service to ensure the patient's safety. The patient's presenting symptoms, physical exam findings, and initial radiographic and laboratory data in the context of their chronic comorbidities is felt to place them at high risk for further clinical deterioration. Furthermore, it is not anticipated that the patient will be medically stable for discharge from the hospital within 2 midnights of admission.   * I certify that  at the point of admission it is my clinical judgment that the patient will require inpatient hospital care spanning beyond 2 midnights from the point of admission due to high intensity of service, high risk for further deterioration and high frequency of surveillance required.*  Author: Para Skeans, MD 10/24/2021 2:25 AM  For on call review [email or web address].

## 2021-10-23 NOTE — Consult Note (Signed)
Pharmacy Antibiotic Note  Katie [name] is a 66 y.o. female admitted on 09/30/2021 with a dog bite. Pt now with a fever of 100.4, WBC elevated to 22.9 and concerns for pneumonia. Pharmacy has been consulted for cefepime and vancomycin dosing.  Plan: Vancomycin 1750 mg IV loading dose, followed by 1000 mg IV daily on Tues/Thurs/Sat with hemodialysis  Cefepime 2 g IV daily on Tues/Thurs/Sat with hemodialysis  Monitor clinical picture and vancomycin levels at steady state F/U C&S, abx deescalation / LOT   Height: 5\' 6"  (167.6 cm) Weight: 82.1 kg (181 lb) IBW/kg (Calculated) : 59.3  Temp (24hrs), Avg:99.4 F (37.4 C), Min:98.4 F (36.9 C), Max:100.4 F (38 C)  Recent Labs  Lab 10/17/21 0800 10/19/21 1004 10/23/21 1605  WBC 6.4 7.8 22.9*  CREATININE 6.41* 5.51* 5.17*  LATICACIDVEN  --   --  0.8    Estimated Creatinine Clearance: 12 mL/min (A) (by C-G formula based on SCr of 5.17 mg/dL (H)).    Allergies  Allergen Reactions   Penicillins Nausea And Vomiting    Pt states "only pills" make her vomit Other reaction(s): Nausea/Vomit    Antimicrobials this admission: Cefepime 6/12 >>  Vancomycin 6/12 >>   Dose adjustments this admission: N/A  Microbiology results: 6/12 BCx: pending 6/12 MRSA PCR: ordered  Thank you for allowing pharmacy to be a part of this patient's care.  Darnelle Bos, PharmD 10/23/2021 7:33 PM

## 2021-10-24 DIAGNOSIS — Y95 Nosocomial condition: Secondary | ICD-10-CM

## 2021-10-24 DIAGNOSIS — S52283A Bent bone of unspecified ulna, initial encounter for closed fracture: Secondary | ICD-10-CM | POA: Diagnosis present

## 2021-10-24 DIAGNOSIS — R509 Fever, unspecified: Secondary | ICD-10-CM | POA: Diagnosis not present

## 2021-10-24 DIAGNOSIS — N185 Chronic kidney disease, stage 5: Secondary | ICD-10-CM

## 2021-10-24 DIAGNOSIS — D631 Anemia in chronic kidney disease: Secondary | ICD-10-CM

## 2021-10-24 DIAGNOSIS — Z992 Dependence on renal dialysis: Secondary | ICD-10-CM

## 2021-10-24 DIAGNOSIS — L03114 Cellulitis of left upper limb: Secondary | ICD-10-CM | POA: Diagnosis not present

## 2021-10-24 DIAGNOSIS — I10 Essential (primary) hypertension: Secondary | ICD-10-CM

## 2021-10-24 DIAGNOSIS — E1122 Type 2 diabetes mellitus with diabetic chronic kidney disease: Secondary | ICD-10-CM

## 2021-10-24 DIAGNOSIS — N186 End stage renal disease: Secondary | ICD-10-CM

## 2021-10-24 DIAGNOSIS — A419 Sepsis, unspecified organism: Secondary | ICD-10-CM

## 2021-10-24 DIAGNOSIS — Z599 Problem related to housing and economic circumstances, unspecified: Secondary | ICD-10-CM | POA: Diagnosis not present

## 2021-10-24 DIAGNOSIS — J189 Pneumonia, unspecified organism: Secondary | ICD-10-CM | POA: Diagnosis not present

## 2021-10-24 LAB — COMPREHENSIVE METABOLIC PANEL
ALT: 13 U/L (ref 0–44)
AST: 15 U/L (ref 15–41)
Albumin: 2.5 g/dL — ABNORMAL LOW (ref 3.5–5.0)
Alkaline Phosphatase: 96 U/L (ref 38–126)
Anion gap: 12 (ref 5–15)
BUN: 36 mg/dL — ABNORMAL HIGH (ref 8–23)
CO2: 25 mmol/L (ref 22–32)
Calcium: 8.4 mg/dL — ABNORMAL LOW (ref 8.9–10.3)
Chloride: 97 mmol/L — ABNORMAL LOW (ref 98–111)
Creatinine, Ser: 5.56 mg/dL — ABNORMAL HIGH (ref 0.44–1.00)
GFR, Estimated: 8 mL/min — ABNORMAL LOW (ref >60)
Glucose, Bld: 129 mg/dL — ABNORMAL HIGH (ref 70–99)
Potassium: 4 mmol/L (ref 3.5–5.1)
Sodium: 134 mmol/L — ABNORMAL LOW (ref 135–145)
Total Bilirubin: 0.8 mg/dL (ref 0.3–1.2)
Total Protein: 6.3 g/dL — ABNORMAL LOW (ref 6.5–8.1)

## 2021-10-24 LAB — CBC
HCT: 28.4 % — ABNORMAL LOW (ref 36.0–46.0)
Hemoglobin: 8.9 g/dL — ABNORMAL LOW (ref 12.0–15.0)
MCH: 29.8 pg (ref 26.0–34.0)
MCHC: 31.3 g/dL (ref 30.0–36.0)
MCV: 95 fL (ref 80.0–100.0)
Platelets: 238 10*3/uL (ref 150–400)
RBC: 2.99 MIL/uL — ABNORMAL LOW (ref 3.87–5.11)
RDW: 14.5 % (ref 11.5–15.5)
WBC: 18.2 10*3/uL — ABNORMAL HIGH (ref 4.0–10.5)
nRBC: 0 % (ref 0.0–0.2)

## 2021-10-24 LAB — GLUCOSE, CAPILLARY
Glucose-Capillary: 93 mg/dL (ref 70–99)
Glucose-Capillary: 139 mg/dL — ABNORMAL HIGH (ref 70–99)
Glucose-Capillary: 146 mg/dL — ABNORMAL HIGH (ref 70–99)

## 2021-10-24 LAB — LACTIC ACID, PLASMA: Lactic Acid, Venous: 1.3 mmol/L (ref 0.5–1.9)

## 2021-10-24 LAB — PHOSPHORUS: Phosphorus: 3 mg/dL (ref 2.5–4.6)

## 2021-10-24 LAB — MRSA NEXT GEN BY PCR, NASAL: MRSA by PCR Next Gen: NOT DETECTED

## 2021-10-24 LAB — CREATININE, SERUM
Creatinine, Ser: 5.41 mg/dL — ABNORMAL HIGH (ref 0.44–1.00)
GFR, Estimated: 8 mL/min — ABNORMAL LOW (ref >60)

## 2021-10-24 MED ORDER — SODIUM CHLORIDE 0.9 % IV SOLN
8.0000 mg | Freq: Three times a day (TID) | INTRAVENOUS | Status: DC | PRN
  Administered 2021-10-24 – 2021-10-26 (×3): 8 mg via INTRAVENOUS
  Filled 2021-10-24 (×3): qty 4

## 2021-10-24 MED ORDER — SCOPOLAMINE 1 MG/3DAYS TD PT72
1.0000 | MEDICATED_PATCH | TRANSDERMAL | Status: DC
Start: 2021-10-24 — End: 2021-11-05
  Administered 2021-10-24 – 2021-11-02 (×5): 1.5 mg via TRANSDERMAL
  Filled 2021-10-24 (×5): qty 1

## 2021-10-24 MED ORDER — INSULIN ASPART 100 UNIT/ML IJ SOLN
0.0000 [IU] | Freq: Three times a day (TID) | INTRAMUSCULAR | Status: DC
  Administered 2021-10-25 – 2021-10-29 (×4): 2 [IU] via SUBCUTANEOUS
  Administered 2021-10-29: 3 [IU] via SUBCUTANEOUS
  Administered 2021-10-30 – 2021-11-05 (×7): 2 [IU] via SUBCUTANEOUS
  Filled 2021-10-24 (×13): qty 1

## 2021-10-24 MED ORDER — PANTOPRAZOLE SODIUM 40 MG PO TBEC
40.0000 mg | DELAYED_RELEASE_TABLET | Freq: Two times a day (BID) | ORAL | Status: DC
  Administered 2021-10-24 – 2021-11-27 (×64): 40 mg via ORAL
  Filled 2021-10-24 (×66): qty 1

## 2021-10-24 MED ORDER — HEPARIN SODIUM (PORCINE) 1000 UNIT/ML IJ SOLN
INTRAMUSCULAR | Status: AC
  Administered 2021-10-24: 4200 [IU]
  Filled 2021-10-24: qty 10

## 2021-10-24 MED ORDER — NEPRO/CARBSTEADY PO LIQD
237.0000 mL | Freq: Three times a day (TID) | ORAL | Status: DC
  Administered 2021-10-26 – 2021-11-07 (×23): 237 mL via ORAL

## 2021-10-24 MED ORDER — SODIUM CHLORIDE 0.9 % IV SOLN
6.2500 mg | Freq: Once | INTRAVENOUS | Status: DC
  Filled 2021-10-24: qty 0.25

## 2021-10-24 MED ORDER — RENA-VITE PO TABS
1.0000 | ORAL_TABLET | Freq: Every day | ORAL | Status: DC
  Administered 2021-10-24 – 2021-11-26 (×33): 1 via ORAL
  Filled 2021-10-24 (×34): qty 1

## 2021-10-24 MED ORDER — ONDANSETRON HCL 4 MG/2ML IJ SOLN
INTRAMUSCULAR | Status: AC
  Filled 2021-10-24: qty 2

## 2021-10-24 NOTE — Consult Note (Signed)
ORTHOPAEDIC CONSULTATION  REQUESTING PHYSICIAN: Emeterio Reeve, DO  Chief Complaint:   Left hand/forearm pain status post dog bite.  History of Present Illness: Katie [name] is a 66 y.o. female with multiple medical problems including hypertension, type 2 diabetes, and end-stage renal disease secondary to her diabetes who apparently tried to break up a fight between her friend's 2 dogs a month ago and was bitten in the left hand.  She presented to the emergency room 2 days later.  The patient was admitted for IV antibiotics in addition to medical management of her other conditions.  Her physical examination was not suspicious for any septic joints or suppurative flexor tenosynovitis, so no orthopedic consultation was requested at that time.    The patient was discharged 4 days later on oral antibiotics.  However, she was unable to afford the antibiotics and so did not get the prescription filled.  The patient returned to the emergency room on 09/30/2021 after apparently being kicked out of the place that she was staying by her friends, complaining of increased left hand pain and swelling, as well as needing dialysis.  The patient was kept in the emergency room for social service reasons, receiving IV antibiotics and dialysis.  The patient finally was brought in to the hospital officially yesterday.  Because of continued complaints regarding her hand, a CT scan of the hand was obtained which suggested possible bony erosions around the fifth MCP joint, concerning for septic arthritis.  Therefore, formal orthopedic consultation was requested.    Overall, the patient feels that her left hand symptoms have been improving as she is now able to move her fingers more freely, although she still is unable to make a tight fist and still has pain in her hand and fingers.  She denies any numbness or paresthesias to her fingers.  Past Medical  History:  Diagnosis Date   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[place])    Hypertension    Renal disorder    Type 2 diabetes mellitus ([place])    Past Surgical History:  Procedure Laterality Date   APPENDECTOMY     AV FISTULA PLACEMENT Left 03/16/2021   Procedure: INSERTION OF ARTERIOVENOUS (AV) GORE-TEX GRAFT ARM;  Surgeon: Algernon Huxley, MD;  Location: [facility];  Service: Vascular;  Laterality: Left;   DIALYSIS/PERMA CATHETER INSERTION N/A 05/16/2020   Procedure: DIALYSIS/PERMA CATHETER INSERTION;  Surgeon: Algernon Huxley, MD;  Location: [place] [facility];  Service: Cardiovascular;  Laterality: N/A;   MANDIBLE SURGERY     RENAL BIOPSY Right    RIGHT HEART CATH N/A 05/10/2020   Procedure: RIGHT HEART CATH;  Surgeon: Nelva Bush, MD;  Location: [facility];  Service: Cardiovascular;  Laterality: N/A;   TEMPORARY DIALYSIS CATHETER N/A 05/11/2020   Procedure: TEMPORARY DIALYSIS CATHETER;  Surgeon: Algernon Huxley, MD;  Location: [place] [facility];  Service: Cardiovascular;  Laterality: N/A;   Social History   Socioeconomic History   Marital status: Single    Spouse name: Not on file   Number of children: Not on file   Years of education: Not on file   Highest education level: Not on file  Occupational History   Not on file  Tobacco Use   Smoking status: Former    Types: Cigarettes   Smokeless tobacco: Never  Vaping Use   Vaping Use: Never used  Substance and Sexual Activity   Alcohol use: Not Currently   Drug use: Never  Sexual activity: Not on file  Other Topics Concern   Not on file  Social History Narrative   Not on file   Social Determinants of Health   Financial Resource Strain: Not on file  Food Insecurity: Not on file  Transportation Needs: Not on file  Physical Activity: Not on file  Stress: Not on file  Social Connections: Not on file   Family History  Problem Relation Age of Onset   Heart attack Mother    Asthma Mother    Uterine cancer  Mother    Skin cancer Father    Prostate cancer Father    CAD Father    Parkinson's disease Father    Lung cancer Brother    Allergies  Allergen Reactions   Penicillins Nausea And Vomiting    Pt states "only pills" make her vomit Other reaction(s): Nausea/Vomit   Prior to Admission medications   Medication Sig Start Date End Date Taking? Authorizing Provider  acetaminophen (TYLENOL) 500 MG tablet Take 500 mg by mouth every 6 (six) hours as needed for moderate pain.    [provider]  amLODipine (NORVASC) 10 MG tablet Take 10 mg by mouth every evening. 07/25/21   [provider]  calcium acetate (PHOSLO) 667 MG capsule Take 1 capsule (667 mg total) by mouth 3 (three) times daily with meals. 03/21/21   Sidney Ace, MD  carvedilol (COREG) 12.5 MG tablet Take 1 tablet (12.5 mg total) by mouth every evening. 03/21/21   Sidney Ace, MD  lactulose (CHRONULAC) 10 GM/15ML solution Take 22.5 mLs (15 g total) by mouth daily. Patient not taking: Reported on 09/24/2021 03/21/21   Ralene Muskrat B, MD  lidocaine (LIDODERM) 5 % Place 1 patch onto the skin daily. Remove & Discard patch within 12 hours or as directed by MD Patient not taking: Reported on 09/30/2021 03/21/21   Sidney Ace, MD  lidocaine-prilocaine (EMLA) cream Apply topically. 06/17/21   [provider]  oxyCODONE-acetaminophen (PERCOCET/ROXICET) 5-325 MG tablet Take 1-2 tablets by mouth every 4 (four) hours as needed for moderate pain or severe pain. 09/27/21   Lorella Nimrod, MD  senna-docusate (SENOKOT-S) 8.6-50 MG tablet Take 1 tablet by mouth 2 (two) times daily. 03/21/21   Sidney Ace, MD  tiZANidine (ZANAFLEX) 4 MG tablet Take 4 mg by mouth 2 (two) times daily as needed. 06/19/21   [provider]  topiramate (TOPAMAX) 50 MG tablet Take 1 tablet (50 mg total) by mouth 2 (two) times daily as needed (headache). 03/21/21   Sidney Ace, MD  traZODone (DESYREL) 50 MG tablet  Take 1 tablet (50 mg total) by mouth every evening. 07/02/20 08/01/20  Nolberto Hanlon, MD  venlafaxine (EFFEXOR) 37.5 MG tablet Take 1 tablet (37.5 mg total) by mouth 2 (two) times daily with a meal. 07/02/20 08/01/20  Nolberto Hanlon, MD   CT HAND LEFT WO CONTRAST  Result Date: 10/24/2021 CLINICAL DATA:  The patient was bitten by a dog on or about 3 weeks ago persistent swelling and pain. Evaluate for septic arthritis. EXAM: CT OF THE LEFT HAND WITHOUT CONTRAST TECHNIQUE: Multidetector CT imaging of the left hand was performed according to the standard protocol. Multiplanar CT image reconstructions were also generated. RADIATION DOSE REDUCTION: This exam was performed according to the departmental dose-optimization program which includes automated exposure control, adjustment of the mA and/or kV according to patient size and/or use of iterative reconstruction technique. COMPARISON:  Only relevant comparison is left hand series 09/23/2021, left  wrist series 09/30/2021. No prior cross-sectional imaging. FINDINGS: Factors affecting image quality: The patient's left hand was overlying the upper abdomen for the scan. This creates abundant streak artifact and beam hardening. The images are grainy and of low quality. IV contrast was also not given. Bones/Joint/Cartilage No displaced fracture is seen or joint dislocations. There are mild features of degenerative arthrosis of the interphalangeal joints. Best seen on the coronal reconstruction images on series 5 axial images 29 and 30 there are small cortical defects suspected at the ulnar aspects of the fifth metatarsal head and the small finger proximal phalanx medial base centered dorsally and concerning for septic arthropathy. This could not be seen on the prior plain films. No other bone defects are observed. Other joint spaces appear maintained. Ligaments Suboptimally assessed by CT. Muscles and Tendons Very poorly seen. No obvious missing ligaments. Grossly normal muscle  bulk in the hand. Soft tissues There is diffuse edema.  No underlying fluid collection is seen. IMPRESSION: Poor image quality and detail due to technique. There is evidence of ulnar-sided bone defects about both sides of the fifth MCP joint concerning for septic arthropathy. No other bone defects are observed but assessment is quite limited. Diffuse edema without underlying fluid collections. Electronically Signed   By: Telford Nab M.D.   On: 10/24/2021 00:03   DG Chest Portable 1 View  Result Date: 10/23/2021 CLINICAL DATA:  Pneumonia EXAM: PORTABLE CHEST 1 VIEW COMPARISON:  Radiograph 09/23/2021 FINDINGS: Right neck catheter tip overlies the distal superior vena cava and right atrium. Unchanged, enlarged cardiac silhouette. Increased bibasilar airspace disease, left greater than right. Persistent small pleural effusions. No pneumothorax. No acute osseous abnormality. Bilateral shoulder degenerative changes. Thoracic spondylosis. IMPRESSION: Increased bibasilar airspace disease, left greater than right, with persistent small pleural effusions. Findings could reflect multifocal pneumonia or atelectasis. Electronically Signed   By: Maurine Simmering M.D.   On: 10/23/2021 15:02    Positive ROS: All other systems have been reviewed and were otherwise negative with the exception of those mentioned in the HPI and as above.  Physical Exam: General:  Alert, no acute distress Psychiatric:  Patient is competent for consent with normal mood and affect   Cardiovascular:  No pedal edema Respiratory:  No wheezing, non-labored breathing GI:  Abdomen is soft and non-tender Skin:  No lesions in the area of chief complaint Neurologic:  Sensation intact distally Lymphatic:  No axillary or cervical lymphadenopathy  Orthopedic Exam:  Orthopedic examination is limited to the left upper extremity and hand.  Skin inspection is notable for moderate swelling around the elbow and proximal forearm more consistent with edema  versus abscess or effusion.  She has mild tenderness to palpation around the proximal forearm and elbow region, but there is no overlying erythema or warmth.  She is able to actively flex and extend her elbow from 20 to 100 degrees without crepitance or significant pain.  Skin inspection of the left wrist is unremarkable.  No swelling, erythema, ecchymosis, abrasions, or other skin abnormalities are identified.  There is no tenderness to palpation around the wrist, nor is there any pain with active or passive range of motion of the wrist.  There also is no evidence for wrist effusion.  Skin inspection of the left hand is notable for several small areas of what appears to be old dried blistered areas consistent with the patient's history of the location of her prior dog bites around the long finger and radial aspect of the index MCP  joint.  Specifically, there is no swelling, erythema, ecchymosis, abrasions, or other skin abnormalities located around the ulnar aspect of the hand tendon or along the fifth digit.  She is able to actively flex and extend all digits, although motion is somewhat limited due to pain, especially as she approaches achieving her fingertips to within 2 cm of her proximal palmar crease.  She also can actively flex and extend her thumb without pain.  She has mild tenderness to palpation over the palm of her hand, more so over the radial versus ulnar aspects volarly.  She is neurovascularly intact to all digits.  X-rays:  Recent x-rays of her left hand and wrist, as well as a recent CT scan of her left hand are available for review and have been reviewed by myself.  According to the CT scan report, there is evidence of "ulnar-sided bone defects about both sides of the fifth MCP joint concerning for septic arthropathy.  No other bone defects are observed..  Diffuse edema without underlying fluid collections."  Both the films and report were reviewed by myself and discussed with the  patient.  Assessment: Residual left hand pain and stiffness status post left hand/forearm cellulitis resulting from dog bites to the left hand.  Plan: The treatment options have been discussed with the patient.  At this point, I do not see any clinical findings that would be of sufficient concern to require surgical intervention.  More specifically, there is no evidence on physical examination to suggest a septic MCP joint of the right little finger.  Therefore, I must interpret the findings described on the recent CT scan of her hand to be primarily due to simple degenerative changes versus poor image quality rather than an actual actively septic process.  However, for the sake of completeness, I do feel it is not unreasonable to proceed with the MRI scan as already ordered for completeness.  Thank you for asking me to participate in the care of this most pleasant yet unfortunate woman.  I will be happy to follow her with you.   Pascal Lux, MD  Beeper #:  [phone]  10/24/2021 4:00 PM

## 2021-10-24 NOTE — Assessment & Plan Note (Addendum)
ESRD  Serial CBC

## 2021-10-24 NOTE — Progress Notes (Signed)
Initial Nutrition Assessment  DOCUMENTATION CODES:   Not applicable  INTERVENTION:   Nepro Shake po TID, each supplement provides 425 kcal and 19 grams protein  Rena-vit po daily   NUTRITION DIAGNOSIS:   Increased nutrient needs related to catabolic illness (ESRD on HD) as evidenced by estimated needs.  GOAL:   Patient will meet greater than or equal to 90% of their needs  MONITOR:   PO intake, Supplement acceptance, Labs, Weight trends, Skin, I & O's  REASON FOR ASSESSMENT:   Malnutrition Screening Tool    ASSESSMENT:   66 y/o female with h/o ESRD on HD, DM, HTN, homelessness and recent dog bite who is admitted with PNA, sepsis, cellulitis of left hand with septic arthropathy and ulnar fracture.  Unable to see pt today as pt in HD at time of RD visit. Pt is known to this RD from previous admissions. Pt with good appetite and oral intake at baseline. Pt is generally agreeable to supplements in hospital. RD will add supplements and vitamins to help pt meet her estimated needs. Per chart, pt appears weight stable pta and since admission. RD will follow up to obtain nutrition related history and exam.   Medications reviewed and include: phoslo, colace, heparin, reglan, insulin, protonix, miralax, cefepime, vancomycin   Labs reviewed: Na 134(L), K 4.0 wnl, BUN 36(H), creat 5.56(H), P 3.0 wnl Wbc- 18.2(H), Hgb 8.9(L), Hct 28.4(L)  NUTRITION - FOCUSED PHYSICAL EXAM: Unable to perform at this time   Diet Order:   Diet Order             Diet Carb Modified Fluid consistency: Thin; Room service appropriate? Yes  Diet effective now                  EDUCATION NEEDS:   Not appropriate for education at this time  Skin:  Skin Assessment: Reviewed RN Assessment (cellulitis of hand, dog bite)  Last BM:  6/10  Height:   Ht Readings from Last 1 Encounters:  10/24/21 5\' 6"  (1.676 m)    Weight:   Wt Readings from Last 1 Encounters:  10/24/21 84.1 kg    Ideal Body  Weight:  59 kg  BMI:  Body mass index is 29.93 kg/m.  Estimated Nutritional Needs:   Kcal:  1900-2200kcal/day  Protein:  95-110g/day  Fluid:  1.8-2.1L/day  [name] MS, RD, LDN Please refer to [facility] for RD and/or RD on-call/weekend/after hours pager

## 2021-10-24 NOTE — Assessment & Plan Note (Signed)
Glycemic protocol.   

## 2021-10-24 NOTE — Progress Notes (Signed)
PHARMACIST - PHYSICIAN COMMUNICATION  DR: Sheppard Coil   CONCERNING: IV to Oral Route Change Policy  RECOMMENDATION: This patient is receiving pantoprazole by the intravenous route.  Based on criteria approved by the Pharmacy and Therapeutics Committee, the intravenous medication(s) is/are being converted to the equivalent oral dose form(s).   DESCRIPTION: These criteria include: The patient is eating (either orally or via tube) and/or has been taking other orally administered medications for a least 24 hours The patient has no evidence of active gastrointestinal bleeding or impaired GI absorption (gastrectomy, short bowel, patient on TNA or NPO).  If you have questions about this conversion, please contact the Pharmacy Department  []   [phone] )  [facility] [x]   [phone] )  [facility] []   [phone] )  [facility] []   [phone] )  [facility] []   [phone] )  [place], [facility] 10/24/2021 9:44 AM

## 2021-10-24 NOTE — Consult Note (Signed)
NAME: Katie [name]  DOB: [birth year]  MRN: [id number]  Date/Time: 10/24/2021 3:21 PM  REQUESTING PROVIDER: Dr.Alexander Subjective:  REASON FOR CONSULT: fever ? Katie [name] is a 66 y.o. 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 She was homeless as the people she lived with asked her to leave.She was seen by hospitalist in the ED who thought the arm was improving and it was more a social issue and asked to continue PO augmentin. She stayed in the ED waiting for placement and HD as OP She completed augmentin on 10/13/21 On 6/12 she had a temp of 100.4, which increased to 101. She also had some cough - she was admitted to the hsopitalist team. Blood culture sent- SARS cov2 neg She was started on vanco and cefepime and I am asked to see the patient for fever Pt has baseline cough which is a little worse No sore throat No sputum No dysuria No diarrhea She is constipated No headache Has pain left hand and fore arm Says she is getting HD thru a catheter which was placed a year ago she says She has a AV graft on the left forearm but not functioning  Past Medical History:  Diagnosis Date   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[place])    Hypertension    Renal disorder    Type 2 diabetes mellitus ([place])     Past Surgical History:  Procedure Laterality Date   APPENDECTOMY     AV FISTULA PLACEMENT Left 03/16/2021   Procedure: INSERTION OF ARTERIOVENOUS (AV) GORE-TEX GRAFT ARM;  Surgeon: Algernon Huxley, MD;  Location: [facility];  Service: Vascular;  Laterality: Left;   DIALYSIS/PERMA CATHETER INSERTION N/A 05/16/2020   Procedure: DIALYSIS/PERMA CATHETER INSERTION;  Surgeon: Algernon Huxley, MD;  Location: [place] [facility];  Service: Cardiovascular;  Laterality: N/A;   MANDIBLE SURGERY     RENAL BIOPSY  Right    RIGHT HEART CATH N/A 05/10/2020   Procedure: RIGHT HEART CATH;  Surgeon: Nelva Bush, MD;  Location: [place] [facility];  Service: Cardiovascular;  Laterality: N/A;   TEMPORARY DIALYSIS CATHETER N/A 05/11/2020   Procedure: TEMPORARY DIALYSIS CATHETER;  Surgeon: Algernon Huxley, MD;  Location: [place] [facility];  Service: Cardiovascular;  Laterality: N/A;    Social History   Socioeconomic History   Marital status: Single    Spouse name: Not on file   Number of children: Not on file   Years of education: Not on file   Highest education level: Not on file  Occupational History   Not on file  Tobacco Use   Smoking status: Former    Types: Cigarettes   Smokeless tobacco: Never  Vaping Use   Vaping Use: Never used  Substance and Sexual Activity   Alcohol use: Not Currently   Drug use: Never   Sexual activity: Not on file  Other Topics Concern   Not on file  Social History Narrative   Not on file   Social Determinants of Health   Financial Resource Strain: Not on file  Food Insecurity: Not on file  Transportation Needs: Not on file  Physical Activity: Not on file  Stress: Not on file  Social Connections: Not on file  Intimate Partner Violence: Not on file  Family History  Problem Relation Age of Onset   Heart attack Mother    Asthma Mother    Uterine cancer Mother    Skin cancer Father    Prostate cancer Father    CAD Father    Parkinson's disease Father    Lung cancer Brother    Allergies  Allergen Reactions   Penicillins Nausea And Vomiting    Pt states "only pills" make her vomit Other reaction(s): Nausea/Vomit   I? Current Facility-Administered Medications  Medication Dose Route Frequency Provider Last Rate Last Admin   acetaminophen (TYLENOL) tablet 650 mg  650 mg Oral Q6H PRN Para Skeans, MD       Or   acetaminophen (TYLENOL) suppository 650 mg  650 mg Rectal Q6H PRN Para Skeans, MD       amLODipine (NORVASC) tablet 10 mg  10 mg  Oral QPM Para Skeans, MD   10 mg at 10/23/21 1936   calcium acetate (PHOSLO) capsule 667 mg  667 mg Oral TID WC Para Skeans, MD   667 mg at 10/24/21 0819   carvedilol (COREG) tablet 12.5 mg  12.5 mg Oral QPM Para Skeans, MD   12.5 mg at 10/23/21 1937   ceFEPIme (MAXIPIME) 2 g in sodium chloride 0.9 % 100 mL IVPB  2 g Intravenous Q T,Th,Sa-HD Darnelle Bos, RPH 200 mL/hr at 10/24/21 1413 2 g at 10/24/21 1413   Chlorhexidine Gluconate Cloth 2 % PADS 6 each  6 each Topical Q0600 Para Skeans, MD   6 each at 10/24/21 0828   docusate sodium (COLACE) capsule 100 mg  100 mg Oral BID Para Skeans, MD   100 mg at 10/24/21 0819   feeding supplement (NEPRO CARB STEADY) liquid 237 mL  237 mL Oral TID BM Emeterio Reeve, DO       guaiFENesin (ROBITUSSIN) 100 MG/5ML liquid 10 mL  10 mL Oral Q4H PRN Para Skeans, MD   10 mL at 10/21/21 2356   heparin injection 5,000 Units  5,000 Units Subcutaneous Q12H Para Skeans, MD   5,000 Units at 10/24/21 0539   HYDROcodone-acetaminophen (NORCO/VICODIN) 5-325 MG per tablet 1 tablet  1 tablet Oral Q4H PRN Para Skeans, MD       insulin aspart (novoLOG) injection 0-15 Units  0-15 Units Subcutaneous TID WC Para Skeans, MD       metoCLOPramide (REGLAN) injection 10 mg  10 mg Intravenous Once Para Skeans, MD       morphine (PF) 2 MG/ML injection 2 mg  2 mg Intravenous Q4H PRN Para Skeans, MD       multivitamin (RENA-VIT) tablet 1 tablet  1 tablet Oral QHS Emeterio Reeve, DO       ondansetron [facility]) 4 MG/2ML injection            ondansetron (ZOFRAN) 8 mg in sodium chloride 0.9 % 50 mL IVPB  8 mg Intravenous Q8H PRN Emeterio Reeve, DO       oxyCODONE-acetaminophen (PERCOCET/ROXICET) 5-325 MG per tablet 1 tablet  1 tablet Oral Q6H PRN Para Skeans, MD   1 tablet at 10/24/21 0011   pantoprazole (PROTONIX) EC tablet 40 mg  40 mg Oral BID Darnelle Bos, RPH       polyethylene glycol (MIRALAX / GLYCOLAX) packet 17 g  17 g Oral Daily Florina Ou V, MD    17 g at 10/24/21 0819   sodium chloride flush (NS) 0.9 %  injection 3 mL  3 mL Intravenous Q12H Florina Ou V, MD   3 mL at 10/24/21 0030   tiZANidine (ZANAFLEX) tablet 4 mg  4 mg Oral BID PRN Para Skeans, MD   4 mg at 10/19/21 1646   topiramate (TOPAMAX) tablet 50 mg  50 mg Oral BID PRN Para Skeans, MD       vancomycin (VANCOCIN) IVPB 1000 mg/200 mL premix  1,000 mg Intravenous Q T,Th,Sa-HD Darnelle Bos, RPH 200 mL/hr at 10/24/21 1158 1,000 mg at 10/24/21 1158     Abtx:  Anti-infectives (From admission, onward)    Start     Dose/Rate Route Frequency Ordered Stop   10/24/21 1200  vancomycin (VANCOCIN) IVPB 1000 mg/200 mL premix        1,000 mg 200 mL/hr over 60 Minutes Intravenous Every T-Th-Sa (Hemodialysis) 10/23/21 2017     10/24/21 1200  ceFEPIme (MAXIPIME) 2 g in sodium chloride 0.9 % 100 mL IVPB        2 g 200 mL/hr over 30 Minutes Intravenous Every T-Th-Sa (Hemodialysis) 10/23/21 2017     10/23/21 1615  vancomycin (VANCOREADY) IVPB 1750 mg/350 mL        1,750 mg 175 mL/hr over 120 Minutes Intravenous  Once 10/23/21 1614 10/23/21 1906   10/23/21 1530  vancomycin (VANCOCIN) IVPB 1000 mg/200 mL premix  Status:  Discontinued        1,000 mg 200 mL/hr over 60 Minutes Intravenous  Once 10/23/21 1526 10/23/21 1614   10/23/21 1530  ceFEPIme (MAXIPIME) 2 g in sodium chloride 0.9 % 100 mL IVPB        2 g 200 mL/hr over 30 Minutes Intravenous  Once 10/23/21 1526 10/23/21 1703   10/02/21 2200  amoxicillin-clavulanate (AUGMENTIN) 500-125 MG per tablet 500 mg  Status:  Discontinued        1 tablet Oral Every 12 hours 10/02/21 1454 10/13/21 1151   09/30/21 2200  amoxicillin-clavulanate (AUGMENTIN) 875-125 MG per tablet 1 tablet  Status:  Discontinued        1 tablet Oral Every 12 hours 09/30/21 1803 10/02/21 1454   09/30/21 1545  Ampicillin-Sulbactam (UNASYN) 3 g in sodium chloride 0.9 % 100 mL IVPB        3 g 200 mL/hr over 30 Minutes Intravenous  Once 09/30/21 1530 09/30/21 1629        REVIEW OF SYSTEMS:  Const:  fever, negative chills, negative weight loss Eyes: negative diplopia or visual changes, negative eye pain ENT: negative coryza, negative sore throat Resp: + cough, no hemoptysis, dyspnea Cards: negative for chest pain, palpitations, lower extremity edema GU: negative for frequency, dysuria and hematuria GI: Negative for abdominal pain, diarrhea, bleeding,  has constipation Skin: negative for rash and pruritus Heme: negative for easy bruising and gum/nose bleeding MS: negative for myalgias, arthralgias, back pain and muscle weakness Neurolo:left foot drop  Psych:f anxiety, depression  Endocrine: , diabetes Allergy/Immunology- PCN- pill nausea  ?  Objective:  VITALS:  BP (!) 128/50 (BP Location: Right Arm)   Pulse 96   Temp 99.8 F (37.7 C)   Resp 20   Ht 5\' 6"  (1.676 m)   Wt 82.6 kg   SpO2 91%   BMI 29.39 kg/m  LDA Rt IJ HD cath PHYSICAL EXAM:  General: Alert, cooperative, no distress, appears stated age.  Head: Normocephalic, without obvious abnormality, atraumatic. Eyes: Conjunctivae clear, anicteric sclerae. Pupils are equal ENT Nares normal. No drainage or sinus tenderness. Lips, mucosa, and  tongue normal. No Thrush Neck: Supple, symmetrical, no adenopathy, thyroid: non tender no carotid bruit and no JVD. Back: No CVA tenderness. Lungs: Clear to auscultation bilaterally. Decreased air entry bases Heart: Regular rate and rhythm, no murmur, rub or gallop. Abdomen: Soft, non-tender,not distended. Bowel sounds normal. No masses Extremities: left hand - the site of dog bite- middle finger has healed No erythema-has some swelling and tenderness dorsum- also swelling forearm- had Iv at the site  09/23/21     Skin: No rashes or lesions. Or bruising Lymph: Cervical, supraclavicular normal. Neurologic: left foot drop Pertinent Labs Lab Results CBC    Component Value Date/Time   WBC 18.2 (H) 10/24/2021 0956   RBC 2.99 (L) 10/24/2021  0956   HGB 8.9 (L) 10/24/2021 0956   HCT 28.4 (L) 10/24/2021 0956   PLT 238 10/24/2021 0956   MCV 95.0 10/24/2021 0956   MCH 29.8 10/24/2021 0956   MCHC 31.3 10/24/2021 0956   RDW 14.5 10/24/2021 0956   LYMPHSABS 1.4 10/23/2021 1605   MONOABS 1.7 (H) 10/23/2021 1605   EOSABS 0.1 10/23/2021 1605   BASOSABS 0.1 10/23/2021 1605       Latest Ref Rng & Units 10/24/2021    9:56 AM 10/23/2021   11:38 PM 10/23/2021    4:05 PM  CMP  Glucose 70 - 99 mg/dL 129   103   BUN 8 - 23 mg/dL 36   31   Creatinine 0.44 - 1.00 mg/dL 5.56  5.41  5.17   Sodium 135 - 145 mmol/L 134   133   Potassium 3.5 - 5.1 mmol/L 4.0   4.2   Chloride 98 - 111 mmol/L 97   98   CO2 22 - 32 mmol/L 25   30   Calcium 8.9 - 10.3 mg/dL 8.4   8.6   Total Protein 6.5 - 8.1 g/dL 6.3   6.7   Total Bilirubin 0.3 - 1.2 mg/dL 0.8   0.7   Alkaline Phos 38 - 126 U/L 96   101   AST 15 - 41 U/L 15   15   ALT 0 - 44 U/L 13   14       Microbiology: Recent Results (from the past 240 hour(s))  SARS Coronavirus 2 by RT PCR (hospital order, performed in [place] hospital lab) *cepheid single result test* Anterior Nasal Swab     Status: None   Collection Time: 10/23/21  4:06 PM   Specimen: Anterior Nasal Swab  Result Value Ref Range Status   SARS Coronavirus 2 by RT PCR NEGATIVE NEGATIVE Final    Comment: (NOTE) SARS-CoV-2 target nucleic acids are NOT DETECTED.  The SARS-CoV-2 RNA is generally detectable in upper and lower respiratory specimens during the acute phase of infection. The lowest concentration of SARS-CoV-2 viral copies this assay can detect is 250 copies / mL. A negative result does not preclude SARS-CoV-2 infection and should not be used as the sole basis for treatment or other patient management decisions.  A negative result may occur with improper specimen collection / handling, submission of specimen other than nasopharyngeal swab, presence of viral mutation(s) within the areas targeted by this assay, and  inadequate number of viral copies (<250 copies / mL). A negative result must be combined with clinical observations, patient history, and epidemiological information.  Fact Sheet for Patients:   https://www.patel.info/  Fact Sheet for Healthcare Providers: https://hall.com/  This test is not yet approved or  cleared by the [place] and  has been authorized for detection and/or diagnosis of SARS-CoV-2 by FDA under an Emergency Use Authorization (EUA).  This EUA will remain in effect (meaning this test can be used) for the duration of the COVID-19 declaration under Section 564(b)(1) of the Act, 21 U.S.C. section 360bbb-3(b)(1), unless the authorization is terminated or revoked sooner.  Performed at [facility], [address] [place], [place] [address]   Blood culture (routine x 2)     Status: None (Preliminary result)   Collection Time: 10/23/21  4:06 PM   Specimen: BLOOD  Result Value Ref Range Status   Specimen Description BLOOD BLOOD RIGHT HAND  Final   Special Requests   Final    BOTTLES DRAWN AEROBIC AND ANAEROBIC Blood Culture adequate volume   Culture   Final    NO GROWTH < 12 HOURS Performed at [facility], [address] [place], [place] [address]    Report Status PENDING  Incomplete  Culture, blood (Routine X 2) w Reflex to ID Panel     Status: None (Preliminary result)   Collection Time: 10/24/21 12:06 AM   Specimen: BLOOD  Result Value Ref Range Status   Specimen Description BLOOD LEFT HAND  Final   Special Requests   Final    BOTTLES DRAWN AEROBIC AND ANAEROBIC Blood Culture adequate volume   Culture   Final    NO GROWTH < 12 HOURS Performed at [facility], [address], [place], [place] [address]    Report Status PENDING  Incomplete    IMAGING RESULTS: Bibasilar air space disease- likely atelectasis VS pneumonia  I have personally reviewed the  films ? Impression/Recommendation ? ?New onset fever in a patient who has been in the hospital for the past 3 weeks as she is homeless and on dialysis D.D of fever HD cath related VS pneumonia  Do not see any infection at the site of the previous dog bite left hand Some edema but that is likely due to getting IV  The Avfistula/graft does not seem to be infected- could do ultrasound left forearm to assess the graft and any collection Pt is currently on vanco and cefepime If no cause is found and she continues to have fever will have to remove HD cath   Dog bite left finger/hand- has been treated appropriately with Augmentin - completed treatment . Clinically no evidence of infection CT hand done today was a poor study Pt seen by ortho-   ?HTn on a amlodipine  DM on insulin ___________________________________________________ Discussed with patient, requesting provider Note:  This document was prepared using Dragon voice recognition software and may include unintentional dictation errors.

## 2021-10-24 NOTE — Progress Notes (Signed)
Hemodialysis Post Treatment Note:  Tx date:10/24/2021 Tx time:3 hours Access: right CVC UF Removed:  Note: Temp: 56 F NP [place] notified , no further orders made Prior to start of HD. Pt complained of pain on her left arm, shoulder and neck PS of 6 but patient refused pain medication  09:56 HD started  10:18 pt complained of nausea, NP shantelle notified with order to give ondansetron SEE MAR  12:58 HD completed, Tolerated well. No HD complications.  Post HD Patient is still complaining of pain on her left shoulder, arm and neck but still refused pain medication. Also Temp is 100.7 and patient refused tylenol.

## 2021-10-24 NOTE — Assessment & Plan Note (Addendum)
   Held amlodipine  Continue coreg  monitor VS closely in setting of sepsis

## 2021-10-24 NOTE — Progress Notes (Signed)
Patient refused her insulin. She was due to get 2 units. MD made aware of same.

## 2021-10-24 NOTE — Progress Notes (Signed)
PT Cancellation Note  Patient Details Name: Katie [name] MRN: [id number] DOB: [birth year]   Cancelled Treatment:  Reason Eval/Treat Not Completed: Other (comment) Per nursing, pt currently receiving respiratory panel, pt not feeling well at all (nauseous/vomiting). Pt stated once she is feeling better, wants to get up and do some exercises at the side of the bed. Will re-attempt next date for AM session.  [name], SPT  [name] 10/24/2021, 2:31 PM

## 2021-10-24 NOTE — Assessment & Plan Note (Signed)
Continue cefepime and vancomycin.

## 2021-10-24 NOTE — Progress Notes (Signed)
Occupational Therapy Treatment Patient Details Name: Katie [name] MRN: [id number] DOB: June 15, 1955 Today's Date: 10/24/2021   History of present illness Pt is a 66 y.o. female presenting to hospital 5/20 with concerns for worsening L hand pain d/t animal bite (pt recently admitted 5/13-5/17 for same reason)--pt not taking antibiotics for known dog bite.  Pt's friend dropped her off at dialysis and refused to do any more (pt now homeless).  PMH includes ESRD on HD TTS, DM, htn, orthostatic hypotension, and h/o mandible surgery.   OT comments  Pt seen for OT tx. Pt endorsing 6/10 head, neck, and L shoulder pain throughout session but agreeable to session before HD today. Pt set up with supplies for seated sponge bath, requiring MIN A for UB/LB bathing. MAX A to comb hair 2/2 neck/shoulder pain, tangles, R hand decr sensation per pt report and L hand decr ROM due to cellulitis. Pt mod indep with bed mobility. Pt progressing towards goals despite set up back medically with fever yesterday. Discharge updated. Continues to benefit from skilled OT services.    Recommendations for follow up therapy are one component of a multi-disciplinary discharge planning process, led by the attending physician.  Recommendations may be updated based on patient status, additional functional criteria and insurance authorization.    Follow Up Recommendations  Home health OT    Assistance Recommended at Discharge Intermittent Supervision/Assistance  Patient can return home with the following  A little help with walking and/or transfers;A little help with bathing/dressing/bathroom;Assistance with cooking/housework;Assistance with feeding;Assist for transportation   Equipment Recommendations  BSC/3in1    Recommendations for Other Services      Precautions / Restrictions Precautions Precautions: Fall Precaution Comments: R IJ permcath; L UE AV fistula Restrictions Weight Bearing Restrictions: No       Mobility  Bed Mobility Overal bed mobility: Modified Independent Bed Mobility: Supine to Sit, Sit to Supine     Supine to sit: Modified independent (Device/Increase time) Sit to supine: Modified independent (Device/Increase time)   General bed mobility comments: Increased time/effort but no direct assist    Transfers                   General transfer comment: declined 2/2 pain after seated bathing     Balance Overall balance assessment: History of Falls, Needs assistance Sitting-balance support: No upper extremity supported, Feet supported Sitting balance-Leahy Scale: Good Sitting balance - Comments: steady sitting reaching within BOS                                   ADL either performed or assessed with clinical judgement   ADL Overall ADL's : Needs assistance/impaired     Grooming: Sitting;Brushing hair;Maximal assistance Grooming Details (indicate cue type and reason): MAX A to comb hair 2/2 tangles, R hand decr sensation per pt report and L hand decr ROM due to cellulitis Upper Body Bathing: Sitting;Minimal assistance Upper Body Bathing Details (indicate cue type and reason): assist for washing back Lower Body Bathing: Sitting/lateral leans;Minimal assistance Lower Body Bathing Details (indicate cue type and reason): MIN A for BLE                            Extremity/Trunk Assessment              Vision       Perception     Praxis  Cognition Arousal/Alertness: Awake/alert Behavior During Therapy: WFL for tasks assessed/performed Overall Cognitive Status: Within Functional Limits for tasks assessed                                          Exercises      Shoulder Instructions       General Comments      Pertinent Vitals/ Pain       Pain Assessment Pain Assessment: 0-10 Pain Score: 6  Pain Location: head, neck, L shoulder Pain Descriptors / Indicators: Aching Pain Intervention(s): Limited activity  within patient's tolerance, Monitored during session, Premedicated before session, Repositioned  Home Living                                          Prior Functioning/Environment              Frequency  Min 2X/week        Progress Toward Goals  OT Goals(current goals can now be found in the care plan section)  Progress towards OT goals: Progressing toward goals  Acute Rehab OT Goals Patient Stated Goal: to return to PLOF OT Goal Formulation: With patient Time For Goal Achievement: 10/27/21 Potential to Achieve Goals: Good  Plan Frequency remains appropriate;Discharge plan needs to be updated    Co-evaluation                 AM-PAC OT "6 Clicks" Daily Activity     Outcome Measure   Help from another person eating meals?: None Help from another person taking care of personal grooming?: A Little Help from another person toileting, which includes using toliet, bedpan, or urinal?: A Little Help from another person bathing (including washing, rinsing, drying)?: A Little Help from another person to put on and taking off regular upper body clothing?: A Little Help from another person to put on and taking off regular lower body clothing?: A Lot 6 Click Score: 18    End of Session Equipment Utilized During Treatment: Oxygen  OT Visit Diagnosis: Unsteadiness on feet (R26.81) Pain - Right/Left: Left Pain - part of body: Shoulder   Activity Tolerance Patient tolerated treatment well   Patient Left in bed;with call bell/phone within reach;with bed alarm set   Nurse Communication Other (comment) (O2 sats)        Time: 0459-9774 OT Time Calculation (min): 24 min  Charges: OT General Charges $OT Visit: 1 Visit OT Treatments $Self Care/Home Management : 23-37 mins  Ardeth Perfect., MPH, MS, OTR/L ascom [phone] 10/24/21, 9:07 AM

## 2021-10-24 NOTE — Assessment & Plan Note (Signed)
Hospital acquired based on patient has been holding in ED since 09/30/2021 and developed pneumonia in that setting, has been on VI antibiotics in the past 90 days  Sputum culture  Resp PCR   Cefepime + vancomycin (today 10/24/21 is day 2 abx) which should also cover cellulitis/MRSA see below for A/P on MSK/Skin potential infection  No urine - unable to asses urine Ag legionella/ S pneumo but these may be less likely

## 2021-10-24 NOTE — Assessment & Plan Note (Addendum)
Due to HCAP, cellulitis  Cefepime + vancomycin (today 10/24/21 is day 2 abx) which should also cover cellulitis/MRSA see below for A/P on MSK/Skin potential infection  Will consult ID as well

## 2021-10-24 NOTE — Assessment & Plan Note (Addendum)
We will continue pt on cefepime and vancomycin. Supplemental oxygen as needed.

## 2021-10-24 NOTE — Assessment & Plan Note (Addendum)
Nephrology following. 

## 2021-10-24 NOTE — Progress Notes (Addendum)
PROGRESS NOTE    Katie [name]  QIW:[id number] DOB: October 31, 1955  DOA: 09/30/2021 Date of Service: 10/24/21 PCP: Merryl Hacker, No     Brief Narrative / Hospital Course:  Katie [name] is a 66 y.o. female with medical history significant of ESRD, DM II, homelessness. Had been admitted 05/13-05/17 for sepsis d/t cellulits from animal bite to arm, discharged on po abx and was unable to fill the Rx, was kicked out of where she was living, no way to get HD.  Came back to ED 09/30/2021 for worsening hand pain, concern for and has been there ever since.  Hospitalist was consulted that day, pt was not suitable for admission to medicine service d/t improving cellulitis and no need for IV antibiotics. TOC consult placed. Nephrology consult placed for dialysis. Pt had been in the ED holding ever since, TOC and DSS following for SNF and later for group home placement, until...  10/23/2021 developing cough, SOB, fever 100.4, tachypnea, CXR (+) for pneumonia. Admission to hospitalist service at that time for sepsis d/t pneumonia. CT L hand also obtained, (+) "ulnar-sided bone defects about both sides of the fifth MCP joint concerning for septic arthropathy. No other bone defects are observed but assessment is quite limited. Diffuse edema without underlying fluid collections." --> cefepime and vancomycin 06/13: asked ortho to review CT, may consider other imaging but no apparent urgent surgical issue. Await ortho suggestions and may consult formally. Consulted ID, Dr Delaine Lame and also asked pharmacy to confirm abx plan is appropriate    Consultants:  Hospitalist consulted by ED for admission 05/20 and declined admission at that time  Nephrology for dialysis    Procedures: none    Subjective: Patient reports nausea which she attributes to coughing and genreal illness/weakness. SHe requests no pain medication if possible since this tends to make nausea worse, but she does not L hand/forearm pain which she states  "hard to tell" if worse from previous pain which never really resolved      ASSESSMENT & PLAN:   Principal Problem:   Sepsis ([place]) Active Problems:   Cellulitis   Hypertension   Type 2 diabetes mellitus ([place])   ESRD (end stage renal disease) ([place])   Financial difficulty   Anemia in chronic kidney disease   Bent bone fracture of ulna   Hospital acquired PNA   Sepsis ([place]) Due to HCAP, cellulitis Cefepime + vancomycin (today 10/24/21 is day 2 abx) which should also cover cellulitis/MRSA see below for A/P on MSK/Skin potential infection Will consult ID as well 06/13  Hospital acquired [facility] Hospital acquired based on patient has been holding in ED since 09/30/2021 and developed pneumonia in that setting, has been on VI antibiotics in the past 90 days Sputum culture ordered 06/13 Resp PCR ordered 06/13 Cefepime + vancomycin (today 10/24/21 is day 2 abx) which should also cover cellulitis/MRSA see below for A/P on MSK/Skin potential infection No urine - unable to asses urine Ag legionella/ S pneumo but these may be less likely  Cellulitis Concern for septic arthropathy L hand Note history dog bite  Secure chat to orthopedics (Dr. Roland Rack 10/24/21) non-urgent. Appreciate recs: re further imaging needed or if CT is sufficient to determine whther or not a procedure will be needed. Happy to place formal consult order.  Cefepime + vancomycin started for HCAP thought to be primary source sepsis (today 10/24/21 is day 2 abx) and should also cover cellulitis/MRSA, however w/ dog bite history and concerning CT findings will ask ID to weigh  in, ortho recs pending re: procedure  Stable/Chronic Problems:   Anemia in chronic kidney disease ESRD Serial CBC  Financial difficulty Pt needs d/c once sepsis/ pneumonia and joint infection possibly resolved.  TOC consult is in place   ESRD (end stage renal disease) [facility]) Nephrology following.   Type 2 diabetes mellitus (H[facility]) Glycemic protocol.    Hypertension Held amlodipine Continue coreg monitor VS closely in setting of sepsis     DVT prophylaxis: heparin Code Status: FULL Family Communication: none at this time Disposition Plan / TOC needs: TOC following, patient is homeless Barriers to discharge / significant pending items: treating sepsis, awaiting specialist recommendations              Objective: Vitals:   10/23/21 2323 10/24/21 0015 10/24/21 0500 10/24/21 0519  BP: (!) 111/52   (!) 115/54  Pulse: 68   69  Resp: 18   20  Temp: 98.4 F (36.9 C)   98.1 F (36.7 C)  TempSrc: Oral   Oral  SpO2: 96%   95%  Weight:   85.7 kg   Height:  5\' 6"  (1.676 m)     No intake or output data in the 24 hours ending 10/24/21 0721 Filed Weights   10/21/21 0859 10/21/21 1206 10/24/21 0500  Weight: 80.5 kg 82.1 kg 85.7 kg    Examination:  Constitutional:  VS as above Eyes: Normal lids and conjunctive, non-icteric sclera Ears, Nose, Mouth, Throat: Normal appearance Neck: No masses, trachea midline Respiratory: Normal respiratory effort Rales/atelectasis at bases Cardiovascular: S1/S2 normal, no murmur/rub/gallop auscultated No lower extremity edema Gastrointestinal: Nontender, no masses Musculoskeletal/Skin:  R hand and dorsal forearm mild erythema, no significant tenderness  No clubbing/cyanosis of digits Neurological: No cranial nerve deficit on limited exam Motor intact and symmetric Psychiatric: Normal judgment/insight Normal mood and affect       Scheduled Medications:   amLODipine  10 mg Oral QPM   calcium acetate  667 mg Oral TID WC   carvedilol  12.5 mg Oral QPM   Chlorhexidine Gluconate Cloth  6 each Topical Q0600   docusate sodium  100 mg Oral BID   heparin  5,000 Units Subcutaneous Q12H   insulin aspart  0-15 Units Subcutaneous TID WC   metoCLOPramide (REGLAN) injection  10 mg Intravenous Once   ondansetron (ZOFRAN) IV  4 mg Intravenous Once   pantoprazole (PROTONIX) IV  40  mg Intravenous Q12H   polyethylene glycol  17 g Oral Daily   sodium chloride flush  3 mL Intravenous Q12H    Continuous Infusions:  ceFEPime (MAXIPIME) IV     lactated ringers 150 mL/hr at 10/24/21 0030   vancomycin      PRN Medications:  acetaminophen **OR** acetaminophen, guaiFENesin, HYDROcodone-acetaminophen, morphine injection, oxyCODONE-acetaminophen, tiZANidine, topiramate  Antimicrobials:  Anti-infectives (From admission, onward)    Start     Dose/Rate Route Frequency Ordered Stop   10/24/21 1200  vancomycin (VANCOCIN) IVPB 1000 mg/200 mL premix        1,000 mg 200 mL/hr over 60 Minutes Intravenous Every T-Th-Sa (Hemodialysis) 10/23/21 2017     10/24/21 1200  ceFEPIme (MAXIPIME) 2 g in sodium chloride 0.9 % 100 mL IVPB        2 g 200 mL/hr over 30 Minutes Intravenous Every T-Th-Sa (Hemodialysis) 10/23/21 2017     10/23/21 1615  vancomycin (VANCOREADY) IVPB 1750 mg/350 mL        1,750 mg 175 mL/hr over 120 Minutes Intravenous  Once 10/23/21 1614 10/23/21 1906  10/23/21 1530  vancomycin (VANCOCIN) IVPB 1000 mg/200 mL premix  Status:  Discontinued        1,000 mg 200 mL/hr over 60 Minutes Intravenous  Once 10/23/21 1526 10/23/21 1614   10/23/21 1530  ceFEPIme (MAXIPIME) 2 g in sodium chloride 0.9 % 100 mL IVPB        2 g 200 mL/hr over 30 Minutes Intravenous  Once 10/23/21 1526 10/23/21 1703   10/02/21 2200  amoxicillin-clavulanate (AUGMENTIN) 500-125 MG per tablet 500 mg  Status:  Discontinued        1 tablet Oral Every 12 hours 10/02/21 1454 10/13/21 1151   09/30/21 2200  amoxicillin-clavulanate (AUGMENTIN) 875-125 MG per tablet 1 tablet  Status:  Discontinued        1 tablet Oral Every 12 hours 09/30/21 1803 10/02/21 1454   09/30/21 1545  Ampicillin-Sulbactam (UNASYN) 3 g in sodium chloride 0.9 % 100 mL IVPB        3 g 200 mL/hr over 30 Minutes Intravenous  Once 09/30/21 1530 09/30/21 1629       Data Reviewed: I have personally reviewed following labs and imaging  studies  CBC: Recent Labs  Lab 10/17/21 0800 10/19/21 1004 10/23/21 1605  WBC 6.4 7.8 22.9*  NEUTROABS  --   --  19.6*  HGB 9.1* 9.4* 9.6*  HCT 30.0* 31.0* 31.3*  MCV 97.4 96.3 96.0  PLT 255 251 518   Basic Metabolic Panel: Recent Labs  Lab 10/17/21 0800 10/19/21 1004 10/23/21 1605 10/23/21 2338  NA 137 137 133*  --   K 4.8 4.5 4.2  --   CL 101 100 98  --   CO2 28 29 30   --   GLUCOSE 84 153* 103*  --   BUN 35* 32* 31*  --   CREATININE 6.41* 5.51* 5.17* 5.41*  CALCIUM 8.6* 8.7* 8.6*  --   PHOS 4.0 3.8  --   --    GFR: Estimated Creatinine Clearance: 11.7 mL/min (A) (by C-G formula based on SCr of 5.41 mg/dL (H)). Liver Function Tests: Recent Labs  Lab 10/17/21 0800 10/19/21 1004 10/23/21 1605  AST  --   --  15  ALT  --   --  14  ALKPHOS  --   --  101  BILITOT  --   --  0.7  PROT  --   --  6.7  ALBUMIN 2.6* 2.6* 2.7*   No results for input(s): "LIPASE", "AMYLASE" in the last 168 hours. No results for input(s): "AMMONIA" in the last 168 hours. Coagulation Profile: No results for input(s): "INR", "PROTIME" in the last 168 hours. Cardiac Enzymes: No results for input(s): "CKTOTAL", "CKMB", "CKMBINDEX", "TROPONINI" in the last 168 hours. BNP (last 3 results) No results for input(s): "PROBNP" in the last 8760 hours. HbA1C: No results for input(s): "HGBA1C" in the last 72 hours. CBG: No results for input(s): "GLUCAP" in the last 168 hours. Lipid Profile: No results for input(s): "CHOL", "HDL", "LDLCALC", "TRIG", "CHOLHDL", "LDLDIRECT" in the last 72 hours. Thyroid Function Tests: No results for input(s): "TSH", "T4TOTAL", "FREET4", "T3FREE", "THYROIDAB" in the last 72 hours. Anemia Panel: No results for input(s): "VITAMINB12", "FOLATE", "FERRITIN", "TIBC", "IRON", "RETICCTPCT" in the last 72 hours. Urine analysis:    Component Value Date/Time   COLORURINE YELLOW (A) 09/23/2021 2143   APPEARANCEUR HAZY (A) 09/23/2021 2143   LABSPEC 1.012 09/23/2021 2143    PHURINE 9.0 (H) 09/23/2021 2143   GLUCOSEU 150 (A) 09/23/2021 2143   HGBUR NEGATIVE 09/23/2021  2143   [place] 09/23/2021 2143   KETONESUR 5 (A) 09/23/2021 2143   PROTEINUR 100 (A) 09/23/2021 2143   NITRITE NEGATIVE 09/23/2021 2143   LEUKOCYTESUR LARGE (A) 09/23/2021 2143   Sepsis Labs: @LABRCNTIP (procalcitonin:4,lacticidven:4)  Recent Results (from the past 240 hour(s))  SARS Coronavirus 2 by RT PCR (hospital order, performed in [facility] hospital lab) *cepheid single result test* Anterior Nasal Swab     Status: None   Collection Time: 10/23/21  4:06 PM   Specimen: Anterior Nasal Swab  Result Value Ref Range Status   SARS Coronavirus 2 by RT PCR NEGATIVE NEGATIVE Final    Comment: (NOTE) SARS-CoV-2 target nucleic acids are NOT DETECTED.  The SARS-CoV-2 RNA is generally detectable in upper and lower respiratory specimens during the acute phase of infection. The lowest concentration of SARS-CoV-2 viral copies this assay can detect is 250 copies / mL. A negative result does not preclude SARS-CoV-2 infection and should not be used as the sole basis for treatment or other patient management decisions.  A negative result may occur with improper specimen collection / handling, submission of specimen other than nasopharyngeal swab, presence of viral mutation(s) within the areas targeted by this assay, and inadequate number of viral copies (<250 copies / mL). A negative result must be combined with clinical observations, patient history, and epidemiological information.  Fact Sheet for Patients:   https://www.patel.info/  Fact Sheet for Healthcare Providers: https://hall.com/  This test is not yet approved or  cleared by the [place] FDA and has been authorized for detection and/or diagnosis of SARS-CoV-2 by FDA under an Emergency Use Authorization (EUA).  This EUA will remain in effect (meaning this test can be used) for  the duration of the COVID-19 declaration under Section 564(b)(1) of the Act, 21 U.S.C. section 360bbb-3(b)(1), unless the authorization is terminated or revoked sooner.  Performed at [facility], [address] [place], [place] [address]   Blood culture (routine x 2)     Status: None (Preliminary result)   Collection Time: 10/23/21  4:06 PM   Specimen: BLOOD  Result Value Ref Range Status   Specimen Description BLOOD BLOOD RIGHT HAND  Final   Special Requests   Final    BOTTLES DRAWN AEROBIC AND ANAEROBIC Blood Culture adequate volume   Culture   Final    NO GROWTH < 12 HOURS Performed at [facility], [address], [place], [place] [address]    Report Status PENDING  Incomplete  Culture, blood (Routine X 2) w Reflex to ID Panel     Status: None (Preliminary result)   Collection Time: 10/24/21 12:06 AM   Specimen: BLOOD  Result Value Ref Range Status   Specimen Description BLOOD LEFT HAND  Final   Special Requests   Final    BOTTLES DRAWN AEROBIC AND ANAEROBIC Blood Culture adequate volume   Culture   Final    NO GROWTH < 12 HOURS Performed at [facility], [address] [place],  [address]    Report Status PENDING  Incomplete         Radiology Studies last 96 hours: CT HAND LEFT WO CONTRAST  Result Date: 10/24/2021 CLINICAL DATA:  The patient was bitten by a dog on or about 3 weeks ago persistent swelling and pain. Evaluate for septic arthritis. EXAM: CT OF THE LEFT HAND WITHOUT CONTRAST TECHNIQUE: Multidetector CT imaging of the left hand was performed according to the standard protocol. Multiplanar CT image reconstructions were also  generated. RADIATION DOSE REDUCTION: This exam was performed according to the departmental dose-optimization program which includes automated exposure control, adjustment of the mA and/or kV according to patient size and/or use of iterative reconstruction technique. COMPARISON:  Only relevant  comparison is left hand series 09/23/2021, left wrist series 09/30/2021. No prior cross-sectional imaging. FINDINGS: Factors affecting image quality: The patient's left hand was overlying the upper abdomen for the scan. This creates abundant streak artifact and beam hardening. The images are grainy and of low quality. IV contrast was also not given. Bones/Joint/Cartilage No displaced fracture is seen or joint dislocations. There are mild features of degenerative arthrosis of the interphalangeal joints. Best seen on the coronal reconstruction images on series 5 axial images 29 and 30 there are small cortical defects suspected at the ulnar aspects of the fifth metatarsal head and the small finger proximal phalanx medial base centered dorsally and concerning for septic arthropathy. This could not be seen on the prior plain films. No other bone defects are observed. Other joint spaces appear maintained. Ligaments Suboptimally assessed by CT. Muscles and Tendons Very poorly seen. No obvious missing ligaments. Grossly normal muscle bulk in the hand. Soft tissues There is diffuse edema.  No underlying fluid collection is seen. IMPRESSION: Poor image quality and detail due to technique. There is evidence of ulnar-sided bone defects about both sides of the fifth MCP joint concerning for septic arthropathy. No other bone defects are observed but assessment is quite limited. Diffuse edema without underlying fluid collections. Electronically Signed   By: Telford Nab M.D.   On: 10/24/2021 00:03   DG Chest Portable 1 View  Result Date: 10/23/2021 CLINICAL DATA:  Pneumonia EXAM: PORTABLE CHEST 1 VIEW COMPARISON:  Radiograph 09/23/2021 FINDINGS: Right neck catheter tip overlies the distal superior vena cava and right atrium. Unchanged, enlarged cardiac silhouette. Increased bibasilar airspace disease, left greater than right. Persistent small pleural effusions. No pneumothorax. No acute osseous abnormality. Bilateral  shoulder degenerative changes. Thoracic spondylosis. IMPRESSION: Increased bibasilar airspace disease, left greater than right, with persistent small pleural effusions. Findings could reflect multifocal pneumonia or atelectasis. Electronically Signed   By: Maurine Simmering M.D.   On: 10/23/2021 15:02            LOS: 1 day    Time spent: 50 min    Emeterio Reeve, DO [facility] 10/24/2021, 7:21 AM   Staff may message me via secure chat in [place]  but this may not receive immediate response,  please page for urgent matters!  If 7PM-7AM, please contact night-coverage [email or web address]  Dictation software was used to generate the above note. Typos may occur and escape review, as with typed/written notes. Please contact Dr Sheppard Coil directly for clarity if needed.

## 2021-10-24 NOTE — Assessment & Plan Note (Deleted)
Ulnar bone fracture or septic arthropathy. orthopedic consult per am team  Cont current abx.

## 2021-10-24 NOTE — Assessment & Plan Note (Addendum)
Concern for septic arthropathy L hand Note history dog bite   Secure chat to orthopedics (Dr. Roland Rack 10/24/21) non-urgent. Appreciate recs: re further imaging needed or if CT is sufficient to determine whther or not a procedure will be needed  Cefepime + vancomycin started for HCAP thought to be primary source sepsis (today 10/24/21 is day 2 abx) and should also cover cellulitis/MRSA, however w/ dog bite history and concerning CT findings will ask ID to weigh in, ortho recs pending re: procedure

## 2021-10-24 NOTE — Assessment & Plan Note (Addendum)
Pt needs d/c once sepsis/ pneumonia and joint infection possibly resolved.   TOC consult is in place

## 2021-10-24 NOTE — Progress Notes (Signed)
[facility]  Dialysis Note   Subjective:   Patient seen and evaluated during dialysis   HEMODIALYSIS FLOWSHEET:  Blood Flow Rate (mL/min): 400 mL/min Arterial Pressure (mmHg): -200 mmHg Venous Pressure (mmHg): 120 mmHg Transmembrane Pressure (mmHg): 60 mmHg Ultrafiltration Rate (mL/min): 500 mL/min Dialysate Flow Rate (mL/min): 500 ml/min Conductivity: 13.7 Conductivity: 13.7 Dialysis Fluid Bolus: Normal Saline Bolus Amount (mL): 250 mL  Temp 100.0 F Complains of soreness on left hand ring finger   Objective:  Vital signs in last 24 hours:  Temp:  [98.1 F (36.7 C)-100.4 F (38 C)] 100 F (37.8 C) (06/13 0949) Pulse Rate:  [68-85] 83 (06/13 1000) Resp:  [15-23] 17 (06/13 1000) BP: (111-128)/(50-61) 115/54 (06/13 1000) SpO2:  [3 %-98 %] 97 % (06/13 1000) Weight:  [84.1 kg-85.7 kg] 84.1 kg (06/13 0943)  Weight change:  Filed Weights   10/21/21 1206 10/24/21 0500 10/24/21 0943  Weight: 82.1 kg 85.7 kg 84.1 kg    Intake/Output: No intake/output data recorded.   Intake/Output this shift:  No intake/output data recorded.  Physical Exam: General: NAD,   Head: Normocephalic, atraumatic. Moist oral mucosal membranes  Eyes: Anicteric  Lungs:  Clear to auscultation, normal effort, congested cough  Heart: Regular rate and rhythm  Abdomen:  Soft, nontender  Extremities:  No peripheral edema.  Neurologic: Nonfocal, moving all four extremities  Skin: No lesions, left hand ring finger edema  Access: Right PermCath    Basic Metabolic Panel: Recent Labs  Lab 10/19/21 1004 10/23/21 1605 10/23/21 2338  NA 137 133*  --   K 4.5 4.2  --   CL 100 98  --   CO2 29 30  --   GLUCOSE 153* 103*  --   BUN 32* 31*  --   CREATININE 5.51* 5.17* 5.41*  CALCIUM 8.7* 8.6*  --   PHOS 3.8  --   --      Liver Function Tests: Recent Labs  Lab 10/19/21 1004 10/23/21 1605  AST  --  15  ALT  --  14  ALKPHOS  --  101  BILITOT  --  0.7  PROT  --  6.7  ALBUMIN  2.6* 2.7*    No results for input(s): "LIPASE", "AMYLASE" in the last 168 hours. No results for input(s): "AMMONIA" in the last 168 hours.  CBC: Recent Labs  Lab 10/19/21 1004 10/23/21 1605  WBC 7.8 22.9*  NEUTROABS  --  19.6*  HGB 9.4* 9.6*  HCT 31.0* 31.3*  MCV 96.3 96.0  PLT 251 250     Cardiac Enzymes: No results for input(s): "CKTOTAL", "CKMB", "CKMBINDEX", "TROPONINI" in the last 168 hours.  BNP: Invalid input(s): "POCBNP"  CBG: Recent Labs  Lab 10/24/21 0808  YKDXIP 38    Microbiology: Results for orders placed or performed during the hospital encounter of 09/30/21  Resp Panel by RT-PCR (Flu A&B, Covid) Nasopharyngeal Swab     Status: None   Collection Time: 09/30/21  9:34 AM   Specimen: Nasopharyngeal Swab; Nasopharyngeal(NP) swabs in vial transport medium  Result Value Ref Range Status   SARS Coronavirus 2 by RT PCR NEGATIVE NEGATIVE Final    Comment: (NOTE) SARS-CoV-2 target nucleic acids are NOT DETECTED.  The SARS-CoV-2 RNA is generally detectable in upper respiratory specimens during the acute phase of infection. The lowest concentration of SARS-CoV-2 viral copies this assay can detect is 138 copies/mL. A negative result does not preclude SARS-Cov-2 infection and should not be used as the sole basis for treatment  or other patient management decisions. A negative result may occur with  improper specimen collection/handling, submission of specimen other than nasopharyngeal swab, presence of viral mutation(s) within the areas targeted by this assay, and inadequate number of viral copies(<138 copies/mL). A negative result must be combined with clinical observations, patient history, and epidemiological information. The expected result is Negative.  Fact Sheet for Patients:  EntrepreneurPulse.com.au  Fact Sheet for Healthcare Providers:  IncredibleEmployment.be  This test is no t yet approved or cleared by the  [place] FDA and  has been authorized for detection and/or diagnosis of SARS-CoV-2 by FDA under an Emergency Use Authorization (EUA). This EUA will remain  in effect (meaning this test can be used) for the duration of the COVID-19 declaration under Section 564(b)(1) of the Act, 21 U.S.C.section 360bbb-3(b)(1), unless the authorization is terminated  or revoked sooner.       Influenza A by PCR NEGATIVE NEGATIVE Final   Influenza B by PCR NEGATIVE NEGATIVE Final    Comment: (NOTE) The Xpert Xpress SARS-CoV-2/FLU/RSV plus assay is intended as an aid in the diagnosis of influenza from Nasopharyngeal swab specimens and should not be used as a sole basis for treatment. Nasal washings and aspirates are unacceptable for Xpert Xpress SARS-CoV-2/FLU/RSV testing.  Fact Sheet for Patients: EntrepreneurPulse.com.au  Fact Sheet for Healthcare Providers: IncredibleEmployment.be  This test is not yet approved or cleared by the [place] FDA and has been authorized for detection and/or diagnosis of SARS-CoV-2 by FDA under an Emergency Use Authorization (EUA). This EUA will remain in effect (meaning this test can be used) for the duration of the COVID-19 declaration under Section 564(b)(1) of the Act, 21 U.S.C. section 360bbb-3(b)(1), unless the authorization is terminated or revoked.  Performed at [facility], [address] [place], [place] [address]   SARS Coronavirus 2 by RT PCR (hospital order, performed in [facility] hospital lab) *cepheid single result test* Anterior Nasal Swab     Status: None   Collection Time: 10/23/21  4:06 PM   Specimen: Anterior Nasal Swab  Result Value Ref Range Status   SARS Coronavirus 2 by RT PCR NEGATIVE NEGATIVE Final    Comment: (NOTE) SARS-CoV-2 target nucleic acids are NOT DETECTED.  The SARS-CoV-2 RNA is generally detectable in upper and lower respiratory specimens during the acute phase of  infection. The lowest concentration of SARS-CoV-2 viral copies this assay can detect is 250 copies / mL. A negative result does not preclude SARS-CoV-2 infection and should not be used as the sole basis for treatment or other patient management decisions.  A negative result may occur with improper specimen collection / handling, submission of specimen other than nasopharyngeal swab, presence of viral mutation(s) within the areas targeted by this assay, and inadequate number of viral copies (<250 copies / mL). A negative result must be combined with clinical observations, patient history, and epidemiological information.  Fact Sheet for Patients:   https://www.patel.info/  Fact Sheet for Healthcare Providers: https://hall.com/  This test is not yet approved or  cleared by the [place] FDA and has been authorized for detection and/or diagnosis of SARS-CoV-2 by FDA under an Emergency Use Authorization (EUA).  This EUA will remain in effect (meaning this test can be used) for the duration of the COVID-19 declaration under Section 564(b)(1) of the Act, 21 U.S.C. section 360bbb-3(b)(1), unless the authorization is terminated or revoked sooner.  Performed at [facility], [address] [place], [place] [address]   Blood culture (routine  x 2)     Status: None (Preliminary result)   Collection Time: 10/23/21  4:06 PM   Specimen: BLOOD  Result Value Ref Range Status   Specimen Description BLOOD BLOOD RIGHT HAND  Final   Special Requests   Final    BOTTLES DRAWN AEROBIC AND ANAEROBIC Blood Culture adequate volume   Culture   Final    NO GROWTH < 12 HOURS Performed at [facility], [address] [place], [place] [address]    Report Status PENDING  Incomplete  Culture, blood (Routine X 2) w Reflex to ID Panel     Status: None (Preliminary result)   Collection Time: 10/24/21 12:06 AM   Specimen: BLOOD  Result Value  Ref Range Status   Specimen Description BLOOD LEFT HAND  Final   Special Requests   Final    BOTTLES DRAWN AEROBIC AND ANAEROBIC Blood Culture adequate volume   Culture   Final    NO GROWTH < 12 HOURS Performed at [facility], [address], [place], [place] [address]    Report Status PENDING  Incomplete    Coagulation Studies: No results for input(s): "LABPROT", "INR" in the last 72 hours.  Urinalysis: No results for input(s): "COLORURINE", "LABSPEC", "PHURINE", "GLUCOSEU", "HGBUR", "BILIRUBINUR", "KETONESUR", "PROTEINUR", "UROBILINOGEN", "NITRITE", "LEUKOCYTESUR" in the last 72 hours.  Invalid input(s): "APPERANCEUR"    Imaging: CT HAND LEFT WO CONTRAST  Result Date: 10/24/2021 CLINICAL DATA:  The patient was bitten by a dog on or about 3 weeks ago persistent swelling and pain. Evaluate for septic arthritis. EXAM: CT OF THE LEFT HAND WITHOUT CONTRAST TECHNIQUE: Multidetector CT imaging of the left hand was performed according to the standard protocol. Multiplanar CT image reconstructions were also generated. RADIATION DOSE REDUCTION: This exam was performed according to the departmental dose-optimization program which includes automated exposure control, adjustment of the mA and/or kV according to patient size and/or use of iterative reconstruction technique. COMPARISON:  Only relevant comparison is left hand series 09/23/2021, left wrist series 09/30/2021. No prior cross-sectional imaging. FINDINGS: Factors affecting image quality: The patient's left hand was overlying the upper abdomen for the scan. This creates abundant streak artifact and beam hardening. The images are grainy and of low quality. IV contrast was also not given. Bones/Joint/Cartilage No displaced fracture is seen or joint dislocations. There are mild features of degenerative arthrosis of the interphalangeal joints. Best seen on the coronal reconstruction images on series 5 axial images 29 and 30 there are small  cortical defects suspected at the ulnar aspects of the fifth metatarsal head and the small finger proximal phalanx medial base centered dorsally and concerning for septic arthropathy. This could not be seen on the prior plain films. No other bone defects are observed. Other joint spaces appear maintained. Ligaments Suboptimally assessed by CT. Muscles and Tendons Very poorly seen. No obvious missing ligaments. Grossly normal muscle bulk in the hand. Soft tissues There is diffuse edema.  No underlying fluid collection is seen. IMPRESSION: Poor image quality and detail due to technique. There is evidence of ulnar-sided bone defects about both sides of the fifth MCP joint concerning for septic arthropathy. No other bone defects are observed but assessment is quite limited. Diffuse edema without underlying fluid collections. Electronically Signed   By: Telford Nab M.D.   On: 10/24/2021 00:03   DG Chest Portable 1 View  Result Date: 10/23/2021 CLINICAL DATA:  Pneumonia EXAM: PORTABLE CHEST 1 VIEW COMPARISON:  Radiograph 09/23/2021 FINDINGS: Right neck catheter tip  overlies the distal superior vena cava and right atrium. Unchanged, enlarged cardiac silhouette. Increased bibasilar airspace disease, left greater than right. Persistent small pleural effusions. No pneumothorax. No acute osseous abnormality. Bilateral shoulder degenerative changes. Thoracic spondylosis. IMPRESSION: Increased bibasilar airspace disease, left greater than right, with persistent small pleural effusions. Findings could reflect multifocal pneumonia or atelectasis. Electronically Signed   By: Maurine Simmering M.D.   On: 10/23/2021 15:02     Medications:    ceFEPime (MAXIPIME) IV     lactated ringers 150 mL/hr at 10/24/21 0805   vancomycin      amLODipine  10 mg Oral QPM   calcium acetate  667 mg Oral TID WC   carvedilol  12.5 mg Oral QPM   Chlorhexidine Gluconate Cloth  6 each Topical Q0600   docusate sodium  100 mg Oral BID    heparin  5,000 Units Subcutaneous Q12H   heparin sodium (porcine)       insulin aspart  0-15 Units Subcutaneous TID WC   metoCLOPramide (REGLAN) injection  10 mg Intravenous Once   ondansetron       pantoprazole  40 mg Oral BID   polyethylene glycol  17 g Oral Daily   sodium chloride flush  3 mL Intravenous Q12H   acetaminophen **OR** acetaminophen, guaiFENesin, heparin sodium (porcine), HYDROcodone-acetaminophen, morphine injection, ondansetron, oxyCODONE-acetaminophen, tiZANidine, topiramate  Assessment/ Plan:  [name] is a 66 y.o.  female with past medical history of ESRD with hemodialysis TTS, diabetes mellitus, hypertension to the hospital seeking long term placement. She was recently admitted and received antibiotics for a dog bite.  She was discharged with a prescription for home antibiotics.  She states her caregivers were unable to retrieve antibiotics.  She states that there was an argument and that she is here seeking long-term placement.  Principal Problem:   Cellulitis Active Problems:   Hypertension   Type 2 diabetes mellitus (HCC)   ESRD (end stage renal disease) ([place])   Financial difficulty   Anemia in chronic kidney disease   Infected dog bite of hand including fingers, left, initial encounter   Sepsis ([place])   CAP (community acquired pneumonia)   Bent bone fracture of ulna   Pneumonia     End Stage Renal Disease on hemodialysis:  Receiving scheduled dialysis treatment today, UF goal 1 L as tolerated.  Next treatment scheduled for Thursday. We are continuing to monitor discharge planning to assess outpatient needs.  No results found for: "POTASSIUM" No intake or output data in the 24 hours ending 10/24/21 1047  2. Hypertension with chronic kidney disease:   Currently receiving amlodipine and carvedilol.  Blood pressure currently 121/55 during dialysis   BP (!) 115/54   Pulse 83   Temp 100 F (37.8 C) (Oral)   Resp 17   Ht 5\' 6"  (1.676 m)   Wt  84.1 kg   SpO2 97%   BMI 29.93 kg/m   3. Anemia of chronic kidney disease/ kidney injury/chronic disease/acute blood loss:   Lab Results  Component Value Date   HGB 9.6 (L) 10/23/2021    4. Secondary Hyperparathyroidism:     Lab Results  Component Value Date   PTH 70 (H) 05/12/2020   CALCIUM 8.6 (L) 10/23/2021   PHOS 3.8 10/19/2021   Calcium remains within acceptable range.  Continue calcium acetate with meals    LOS: [place], MD [facility] 6/13/202310:47 AM

## 2021-10-25 ENCOUNTER — Inpatient Hospital Stay: Payer: Medicaid Other

## 2021-10-25 DIAGNOSIS — N186 End stage renal disease: Secondary | ICD-10-CM | POA: Diagnosis not present

## 2021-10-25 DIAGNOSIS — R509 Fever, unspecified: Secondary | ICD-10-CM | POA: Diagnosis not present

## 2021-10-25 DIAGNOSIS — J189 Pneumonia, unspecified organism: Secondary | ICD-10-CM | POA: Diagnosis not present

## 2021-10-25 DIAGNOSIS — A419 Sepsis, unspecified organism: Secondary | ICD-10-CM | POA: Diagnosis not present

## 2021-10-25 DIAGNOSIS — W540XXA Bitten by dog, initial encounter: Secondary | ICD-10-CM | POA: Diagnosis not present

## 2021-10-25 LAB — RESPIRATORY PANEL BY PCR

## 2021-10-25 LAB — BASIC METABOLIC PANEL
Anion gap: 6 (ref 5–15)
BUN: 20 mg/dL (ref 8–23)
CO2: 29 mmol/L (ref 22–32)
Calcium: 8.3 mg/dL — ABNORMAL LOW (ref 8.9–10.3)
Chloride: 100 mmol/L (ref 98–111)
Creatinine, Ser: 3.46 mg/dL — ABNORMAL HIGH (ref 0.44–1.00)
GFR, Estimated: 14 mL/min — ABNORMAL LOW (ref >60)
Glucose, Bld: 118 mg/dL — ABNORMAL HIGH (ref 70–99)
Potassium: 3.3 mmol/L — ABNORMAL LOW (ref 3.5–5.1)
Sodium: 135 mmol/L (ref 135–145)

## 2021-10-25 LAB — GLUCOSE, CAPILLARY
Glucose-Capillary: 85 mg/dL (ref 70–99)
Glucose-Capillary: 118 mg/dL — ABNORMAL HIGH (ref 70–99)
Glucose-Capillary: 142 mg/dL — ABNORMAL HIGH (ref 70–99)
Glucose-Capillary: 87 mg/dL (ref 70–99)

## 2021-10-25 LAB — CBC
HCT: 27.5 % — ABNORMAL LOW (ref 36.0–46.0)
Hemoglobin: 8.8 g/dL — ABNORMAL LOW (ref 12.0–15.0)
MCH: 30.1 pg (ref 26.0–34.0)
MCHC: 32 g/dL (ref 30.0–36.0)
MCV: 94.2 fL (ref 80.0–100.0)
Platelets: 211 10*3/uL (ref 150–400)
RBC: 2.92 MIL/uL — ABNORMAL LOW (ref 3.87–5.11)
RDW: 14.1 % (ref 11.5–15.5)
WBC: 11.7 10*3/uL — ABNORMAL HIGH (ref 4.0–10.5)
nRBC: 0 % (ref 0.0–0.2)

## 2021-10-25 MED ORDER — LORAZEPAM 1 MG PO TABS
1.0000 mg | ORAL_TABLET | Freq: Once | ORAL | Status: AC
  Administered 2021-10-25: 1 mg via ORAL
  Filled 2021-10-25: qty 1

## 2021-10-25 MED ORDER — GADOBUTROL 1 MMOL/ML IV SOLN
10.0000 mL | Freq: Once | INTRAVENOUS | Status: AC | PRN
  Administered 2021-10-25: 10 mL via INTRAVENOUS

## 2021-10-25 MED ORDER — EPOETIN ALFA 4000 UNIT/ML IJ SOLN
4000.0000 [IU] | INTRAMUSCULAR | Status: DC
  Administered 2021-10-28 – 2021-11-04 (×4): 4000 [IU] via INTRAVENOUS
  Filled 2021-10-25 (×4): qty 1

## 2021-10-25 MED ORDER — POTASSIUM CHLORIDE 20 MEQ PO PACK
20.0000 meq | PACK | Freq: Once | ORAL | Status: AC
  Administered 2021-10-25: 20 meq via ORAL
  Filled 2021-10-25: qty 1

## 2021-10-25 NOTE — Progress Notes (Signed)
  Progress Note   Patient: Katie [name] NAT:[id number] DOB: Sep 15, 1955 DOA: 09/30/2021     2 DOS: the patient was seen and examined on 10/25/2021   Brief hospital course: Katie [name] is a 66 y.o. female with medical history significant of ESRD, DM II, homelessness. Had been admitted 05/13-05/17 for sepsis d/t cellulits from animal bite to arm, discharged on po abx and was unable to fill the Rx, was kicked out of where she was living, no way to get HD.  Patient has been in the ED since arriving the hospital.  She was admitted to the hospital again on 10/23/2021 as patient developed cough short of breath and fever.  Chest x-ray showed pneumonia.  Patient was diagnosed with sepsis and pneumonia, was started on cefepime and vancomycin. Patient is currently homeless, has a severe weakness.  No discharge option at this time.  Assessment and Plan: Sepsis. Healthcare associate pneumonia. Appreciate ID consult.  Patient still has low-grade fever yesterday, blood culture so far has no growth. Sepsis most likely is due to pneumonia.  However, if patient continued to have a fever, ID has recommended remove dialysis catheter. At this point, we will continue to follow.  Recent dog bites with cellulitis. Left hand cellulitis seem to be resolved.  Currently, patient has some mild pain in the second and middle finger, but no open wound. ID has lower suspicion for septic arthropathy of the left hand, orthopedics recommended MRI to totally rule it out.  I will order MRI today.  End stage renal disease on dialysis. Anemia of chronic kidney disease. Hypokalemia. Patient is a followed by nephrology for dialysis.  Type 2 diabetes. Continue current regimen.  Essential hypertension. Continue to follow     Subjective:  Patient currently doing well, did not feel a fever or chills. No nausea vomiting.  Physical Exam: Vitals:   10/24/21 2105 10/25/21 0500 10/25/21 0510 10/25/21 0835  BP: 121/67  132/64 (!)  114/58  Pulse: 77  73 78  Resp: 16  18 16   Temp: 98.1 F (36.7 C)  98.9 F (37.2 C) 99.2 F (37.3 C)  TempSrc: Oral     SpO2: 95%  94% 92%  Weight:  84.2 kg    Height:       General exam: Appears calm and comfortable  Respiratory system: Clear to auscultation. Respiratory effort normal. Cardiovascular system: S1 & S2 heard, RRR. No JVD, murmurs, rubs, gallops or clicks. No pedal edema. Gastrointestinal system: Abdomen is nondistended, soft and nontender. No organomegaly or masses felt. Normal bowel sounds heard. Central nervous system: Alert and oriented. No focal neurological deficits. Extremities: Left second and middle finger has some tenderness, no open wound. Skin: No rashes, lesions or ulcers Psychiatry: Judgement and insight appear normal. Mood & affect appropriate.   Data Reviewed:  CT study and lab results reviewed  Family Communication: Does not have any family.  Disposition: Status is: Inpatient Remains inpatient appropriate because: Of disease IV treatment.  Planned Discharge Destination:  To be determined    Time spent: 35 minutes  Author: Sharen Hones, MD 10/25/2021 1:43 PM  For on call review [email or web address].

## 2021-10-25 NOTE — Progress Notes (Signed)
Physical Therapy Treatment Patient Details Name: Katie [name] MRN: [id number] DOB: 10-18-1955 Today's Date: 10/25/2021   History of Present Illness Pt is a 67 y.o. female presenting to hospital 5/20 with concerns for worsening L hand pain d/t animal bite (pt recently admitted 5/13-5/17 for same reason)--pt not taking antibiotics for known dog bite.  Pt's friend dropped her off at dialysis and refused to do any more (pt now homeless).  PMH includes ESRD on HD TTS, DM, htn, orthostatic hypotension, and h/o mandible surgery.    PT Comments    Pt reported feeling better than yesterday. However, during session pt reported nausea and lightheadedness upon standing. Pt symptoms improved with sitting EOB. SpO2 remained >90% throughout session on 3 lpm via nasal cannula. Pt did not feel well enough to ambulate today due to pain increasing to 7/10 in L shoulder and nausea. Pt completed there-ex in supine and seated positions and transferred to [facility] to attempt to void (pt unable). Attempted timed 5x sit-to-stand assessment, but pt required rest breaks after each repetition. Discharge recommendation updated to SNF based on current mobility status and decreased activity tolerance. Potential to upgrade discharge recommendation to [facility] PT with pain control and improvement of medical conditions.   Recommendations for follow up therapy are one component of a multi-disciplinary discharge planning process, led by the attending physician.  Recommendations may be updated based on patient status, additional functional criteria and insurance authorization.  Follow Up Recommendations  Skilled nursing-short term rehab (<3 hours/day)     Assistance Recommended at Discharge Frequent or constant Supervision/Assistance  Patient can return home with the following A lot of help with walking and/or transfers;A lot of help with bathing/dressing/bathroom;Assistance with cooking/housework;Assist for transportation;Help with stairs or  ramp for entrance   Equipment Recommendations       Recommendations for Other Services       Precautions / Restrictions Precautions Precautions: Fall Precaution Comments: R IJ permcath; L UE AV fistula Restrictions Weight Bearing Restrictions: No     Mobility  Bed Mobility Overal bed mobility: Modified Independent Bed Mobility: Supine to Sit, Sit to Supine     Supine to sit: Modified independent (Device/Increase time), HOB elevated (pt utilized HOB elevated and UE on bedrail) Sit to supine: Modified independent (Device/Increase time), HOB elevated   General bed mobility comments: Increased time/effort to complete bed mobility    Transfers Overall transfer level: Needs assistance Equipment used: Rolling walker (2 wheels) Transfers: Sit to/from Stand, Bed to chair/wheelchair/BSC Sit to Stand: Min guard   Step pivot transfers: Min guard       General transfer comment: Increased time/effort required for transfers with CGA with slight LOB in standing with pt able to self-correct    Ambulation/Gait               General Gait Details: Pt unable to ambulate today due to report of nausea/lightheadedness in standing.   Stairs             Wheelchair Mobility    Modified Rankin (Stroke Patients Only)       Balance Overall balance assessment: History of Falls, Needs assistance Sitting-balance support: No upper extremity supported, Feet supported Sitting balance-Leahy Scale: Good Sitting balance - Comments: Able to sit and perform LAQs with 1 UE support on bed without LOB   Standing balance support: Bilateral upper extremity supported, During functional activity, Reliant on assistive device for balance Standing balance-Leahy Scale: Fair Standing balance comment: LOB in standing with pt able to self-correct using  bilat UEs on RW                            Cognition Arousal/Alertness: Awake/alert Behavior During Therapy: WFL for tasks  assessed/performed Overall Cognitive Status: Within Functional Limits for tasks assessed                                          Exercises Total Joint Exercises Ankle Circles/Pumps: AROM, Strengthening, Both, 10 reps, Seated Heel Slides: AROM, Strengthening, Both, 10 reps, Supine Straight Leg Raises: AROM, Strengthening, Both, 10 reps, Supine Long Arc Quad: AROM, Strengthening, Both, 10 reps, Seated Bridges: AAROM, Strengthening, Both, 5 reps, Supine, Other (comment) (attempted bridging with pt unable to complete full ROM) Other Exercises Other Exercises: Pt transferred from bed to [facility] to void. However, once on [facility] pt reported unable to void and will need to try again at another time.    General Comments        Pertinent Vitals/Pain Pain Assessment Pain Assessment: 0-10 Pain Score: 7  Pain Location: L shoulder Pain Descriptors / Indicators: Grimacing, Aching Pain Intervention(s): Limited activity within patient's tolerance, Monitored during session, Repositioned    Home Living                          Prior Function            PT Goals (current goals can now be found in the care plan section) Acute Rehab PT Goals Patient Stated Goal: improve mobility PT Goal Formulation: With patient Time For Goal Achievement: 10/27/21 Potential to Achieve Goals: Good Progress towards PT goals: Progressing toward goals    Frequency    Min 2X/week      PT Plan Discharge plan needs to be updated    Co-evaluation              AM-PAC PT "6 Clicks" Mobility   Outcome Measure  Help needed turning from your back to your side while in a flat bed without using bedrails?: None Help needed moving from lying on your back to sitting on the side of a flat bed without using bedrails?: A Little (pt modified independent in bed mobility but uses bedrails and HOB elevated) Help needed moving to and from a bed to a chair (including a wheelchair)?: A  Little Help needed standing up from a chair using your arms (e.g., wheelchair or bedside chair)?: A Little Help needed to walk in hospital room?: A Little Help needed climbing 3-5 steps with a railing? : A Lot 6 Click Score: 18    End of Session Equipment Utilized During Treatment: Gait belt;Oxygen;Other (comment) (portable oxygen during transfer 3lpm via nasal canula) Activity Tolerance: Patient limited by pain;Other (comment) (pt limited by nausea/lightheadedness and pain that increased to 7/10 with mobility tasks) Patient left: in bed;with call bell/phone within reach;with bed alarm set Nurse Communication: Mobility status PT Visit Diagnosis: Other abnormalities of gait and mobility (R26.89);Unsteadiness on feet (R26.81);Muscle weakness (generalized) (M62.81);History of falling (Z91.81);Pain Pain - Right/Left: Left Pain - part of body: Shoulder;Hand;Arm     Time: 9741-6384 PT Time Calculation (min) (ACUTE ONLY): 23 min  Charges:                        Rella Larve, SPT  Samariyah Cowles 10/25/2021, 11:12  AM

## 2021-10-25 NOTE — Evaluation (Addendum)
Clinical/Bedside Swallow Evaluation Patient Details  Name: Katie [name] MRN: [id number] Date of Birth: 01-20-1956  Today's Date: 10/25/2021 Time: SLP Start Time (ACUTE ONLY): 34 SLP Stop Time (ACUTE ONLY): 1615 SLP Time Calculation (min) (ACUTE ONLY): 45 min  Past Medical History:  Past Medical History:  Diagnosis Date   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[place])    Hypertension    Renal disorder    Type 2 diabetes mellitus ([place])    Past Surgical History:  Past Surgical History:  Procedure Laterality Date   APPENDECTOMY     AV FISTULA PLACEMENT Left 03/16/2021   Procedure: INSERTION OF ARTERIOVENOUS (AV) GORE-TEX GRAFT ARM;  Surgeon: Algernon Huxley, MD;  Location: [facility];  Service: Vascular;  Laterality: Left;   DIALYSIS/PERMA CATHETER INSERTION N/A 05/16/2020   Procedure: DIALYSIS/PERMA CATHETER INSERTION;  Surgeon: Algernon Huxley, MD;  Location: [place] [facility];  Service: Cardiovascular;  Laterality: N/A;   MANDIBLE SURGERY     RENAL BIOPSY Right    RIGHT HEART CATH N/A 05/10/2020   Procedure: RIGHT HEART CATH;  Surgeon: Nelva Bush, MD;  Location: [place] [facility];  Service: Cardiovascular;  Laterality: N/A;   TEMPORARY DIALYSIS CATHETER N/A 05/11/2020   Procedure: TEMPORARY DIALYSIS CATHETER;  Surgeon: Algernon Huxley, MD;  Location: [place] [facility];  Service: Cardiovascular;  Laterality: N/A;   HPI:    Pt  is a 66 y.o. female with medical history significant of ESRD, HD, vision deficits reported by pt, DM II, Homelessness. Had been admitted 05/13-05/17 for sepsis d/t cellulits from animal bite to arm, discharged on po abx and was unable to fill the Rx, was kicked out of where she was living, no way to get HD.  Patient has been in the ED since arriving the hospital.  She was admitted to the hospital again on 10/23/2021 as patient developed cough short of breath and fever.  Chest x-ray showed pneumonia.  Patient was diagnosed with sepsis and pneumonia, was  started on cefepime and vancomycin.  Had MRI performed on 6/14 did not show any osteomyelitis.  Patient condition appears to be improving.  Patient has been seen by ID, currently on vancomycin and cefepime.  Patient no longer has any fever, blood culture has been negative.  Still monitoring cellulitis in LUE.  Patient is currently homeless, has a severe weakness.  No discharge option at this time.     CXR on 10/23/21: bibasilar airspace disease, left greater than right, with persistent small pleural effusions.    Assessment / Plan / Recommendation  Clinical Impression  Pt seen for BSE today. Pt denied any issues w/ swallowing other than to have to give min extra attention to gumming/mashing foods well d/t Edentulous status. Pt also endorsed Vision deficits and stated she could not read the menu for ordering foods -- assisted pt in ordering her next 2 meals. NSG made aware.  Pt verbal and engaged easily w/ this Clinician; she followed all instructions appropriately.   Pt appears to present w/ adequate oropharyngeal phase swallow function in setting of Edentulous status w/ No oropharyngeal phase dysphagia noted, No neuromuscular deficits noted. Pt consumed po trials w/ No overt, clinical s/s of aspiration during po trials. Pt appears at reduced risk for aspiration following general aspiration precautions.   During po trials, pt consumed all consistencies w/ no overt coughing, decline in vocal quality, or change in respiratory presentation during/post trials. Oral phase appeared [facility] w/ timely bolus management, mashing/gumming  attention to solids, and control of bolus propulsion for A-P transfer for swallowing. Oral clearing achieved w/ all trial consistencies. Pt used lingual sweeping(independently) to clear any remaining residue. OM Exam appeared [facility] w/ no unilateral weakness noted. Speech Clear. Pt fed self given min setup support, positioning encouragement for upright sitting. Pt stated she is used to  eating w/out her Dentures; for "some time now".    Recommend continue a Regular consistency diet w/ well-Cut meats, moistened foods; Thin liquids. Recommend general aspiration precautions, Pills in a Puree if desired for easier swallowing as pt described Larger pills causing difficulty to swallow at times. Education given on Pills in Puree; food consistencies and easy to eat options; general aspiration precautions. NSG to reconsult if any new needs arise. NSG agreed      Aspiration Risk   Reduced following general aspiration precautoins   Diet Recommendation  Continue current Regular consistency diet w/ well-Cut meats/foods, moistened foods; Thin liquids. Recommend general aspiration precautions   Other  Recommendations  N/a   Recommendations for follow up therapy are one component of a multi-disciplinary discharge planning process, led by the attending physician.  Recommendations may be updated based on patient status, additional functional criteria and insurance authorization.  Follow up Recommendations   No ST needs     Assistance Recommended at Discharge  PRN, if needed for setup  Functional Status Assessment  N/a   Frequency and Duration  N/a         Prognosis  Good - No ST needs     Swallow Study   General  Edentulous status; oral care previously done   Oral/Motor/Sensory Function  WFL - No unilateral weakness  Ice Chips Ice chips: Within functional limits Presentation: Spoon (fed; 2 trials)   Thin Liquid Thin Liquid: Within functional limits Presentation: Self Fed;Straw (~4 ozs) Other Comments: water, juice    Nectar Thick Nectar Thick Liquid: Not tested   Honey Thick Honey Thick Liquid: Not tested   Puree Puree: Within functional limits Presentation: Self Fed;Spoon (8+ trials)   Solid     Solid: Within functional limits (Edentulous -- moistened the food) Presentation: Self Fed (9 trials)           Orinda Kenner, MS, CCC-SLP Speech Language  Pathologist Rehab Services; [place] [phone] (ascom) Danamarie Minami 10/25/2021,4:15 PM

## 2021-10-25 NOTE — Hospital Course (Addendum)
Iretha Kirley is a 66 y.o. female with medical history significant of ESRD, DM II, homelessness. Had been admitted 05/13-05/17 for sepsis d/t cellulits from animal bite to arm, discharged on po abx and was unable to fill the Rx, was kicked out of where she was living, no way to get HD.  Patient has been in the ED since arriving the hospital.  She was admitted to the hospital again on 10/23/2021 as patient developed cough short of breath and fever.  Chest x-ray showed pneumonia.  Patient was diagnosed with sepsis and pneumonia, was started on cefepime and vancomycin. Had MRI performed on 6/14 did not show any osteomyelitis. Patient is currently homeless, has a severe weakness.  No discharge option at this time. 6/15.  Patient has no nausea, KUB showed fecal impaction with severe dilated colon, patient was started on senna as well as scheduled lactulose.  CT chest also showed dense right lower lobe consolidation consistent with aspiration pneumonia.  CT of the left shoulder did not show any osteomyelitis or septic arthritis. Fecal impaction resolved after Nulytely on 6/17. Patient condition has stabilized, pending nursing home placement. 6/29.  Patient had a significant left upper extremity swelling, duplex ultrasound did not show DVT.  She completed a 10-day course of doxycycline and Keflex.  Ultrasound of the left upper extremity soft tissue did not show any evidence of abscess.

## 2021-10-25 NOTE — Progress Notes (Signed)
Date of Admission:  09/30/2021    ID: Katie [name] is a 66 y.o. female Principal Problem:   Sepsis ([place]) Active Problems:   Hypertension   Type 2 diabetes mellitus ([place])   ESRD (end stage renal disease) ([place])   Financial difficulty   Anemia in chronic kidney disease   Cellulitis   Bent bone fracture of ulna   Hospital acquired PNA    Subjective: Has left shoulder pain Restricted shoulder mobility Says this started after the dog bite No fever Medications:   amLODipine  10 mg Oral QPM   calcium acetate  667 mg Oral TID WC   carvedilol  12.5 mg Oral QPM   Chlorhexidine Gluconate Cloth  6 each Topical Q0600   docusate sodium  100 mg Oral BID   [START ON 10/26/2021] epoetin (EPOGEN/PROCRIT) injection  4,000 Units Intravenous Q T,Th,Sa-HD   feeding supplement (NEPRO CARB STEADY)  237 mL Oral TID BM   heparin  5,000 Units Subcutaneous Q12H   insulin aspart  0-15 Units Subcutaneous TID WC   LORazepam  1 mg Oral Once   metoCLOPramide (REGLAN) injection  10 mg Intravenous Once   multivitamin  1 tablet Oral QHS   pantoprazole  40 mg Oral BID   polyethylene glycol  17 g Oral Daily   scopolamine  1 patch Transdermal Q72H   sodium chloride flush  3 mL Intravenous Q12H    Objective: Vital signs in last 24 hours: Temp:  [98.1 F (36.7 C)-100.2 F (37.9 C)] 99.2 F (37.3 C) (06/14 0835) Pulse Rate:  [73-98] 78 (06/14 0835) Resp:  [16-18] 16 (06/14 0835) BP: (114-132)/(55-67) 114/58 (06/14 0835) SpO2:  [90 %-95 %] 92 % (06/14 0835) Weight:  [84.2 kg] 84.2 kg (06/14 0500)  LDA Foley Central lines Other catheters  PHYSICAL EXAM:  General: Alert, cooperative, no distress, appears stated age.  Head: Normocephalic, without obvious abnormality, atraumatic. Eyes: Conjunctivae clear, anicteric sclerae. Pupils are equal ENT Nares normal. No drainage or sinus tenderness. Lips, mucosa, and tongue normal. No Thrush Neck: Supple, symmetrical, no adenopathy, thyroid: non tender no  carotid bruit and no JVD. Back: No CVA tenderness. Lungs: Clear to auscultation bilaterally. No Wheezing or Rhonchi. No rales. Heart: Regular rate and rhythm, no murmur, rub or gallop. Abdomen: Soft, non-tender,not distended. Bowel sounds normal. No masses Extremities:left arm swollen Abduction of shoulder joint painful and restricted Skin: No rashes or lesions. Or bruising Lymph: Cervical, supraclavicular normal. Neurologic: Grossly non-focal  Lab Results Recent Labs    10/24/21 0956 10/25/21 0437  WBC 18.2* 11.7*  HGB 8.9* 8.8*  HCT 28.4* 27.5*  NA 134* 135  K 4.0 3.3*  CL 97* 100  CO2 25 29  BUN 36* 20  CREATININE 5.56* 3.46*   Liver Panel Recent Labs    10/23/21 1605 10/24/21 0956  PROT 6.7 6.3*  ALBUMIN 2.7* 2.5*  AST 15 15  ALT 14 13  ALKPHOS 101 96  BILITOT 0.7 0.8   Sedimentation Rate No results for input(s): "ESRSEDRATE" in the last 72 hours. C-Reactive Protein No results for input(s): "CRP" in the last 72 hours.  Microbiology: [facility] NG Studies/Results: CT HAND LEFT WO CONTRAST  Result Date: 10/24/2021 CLINICAL DATA:  The patient was bitten by a dog on or about 3 weeks ago persistent swelling and pain. Evaluate for septic arthritis. EXAM: CT OF THE LEFT HAND WITHOUT CONTRAST TECHNIQUE: Multidetector CT imaging of the left hand was performed according to the standard protocol. Multiplanar CT image reconstructions were  also generated. RADIATION DOSE REDUCTION: This exam was performed according to the departmental dose-optimization program which includes automated exposure control, adjustment of the mA and/or kV according to patient size and/or use of iterative reconstruction technique. COMPARISON:  Only relevant comparison is left hand series 09/23/2021, left wrist series 09/30/2021. No prior cross-sectional imaging. FINDINGS: Factors affecting image quality: The patient's left hand was overlying the upper abdomen for the scan. This creates abundant streak artifact  and beam hardening. The images are grainy and of low quality. IV contrast was also not given. Bones/Joint/Cartilage No displaced fracture is seen or joint dislocations. There are mild features of degenerative arthrosis of the interphalangeal joints. Best seen on the coronal reconstruction images on series 5 axial images 29 and 30 there are small cortical defects suspected at the ulnar aspects of the fifth metatarsal head and the small finger proximal phalanx medial base centered dorsally and concerning for septic arthropathy. This could not be seen on the prior plain films. No other bone defects are observed. Other joint spaces appear maintained. Ligaments Suboptimally assessed by CT. Muscles and Tendons Very poorly seen. No obvious missing ligaments. Grossly normal muscle bulk in the hand. Soft tissues There is diffuse edema.  No underlying fluid collection is seen. IMPRESSION: Poor image quality and detail due to technique. There is evidence of ulnar-sided bone defects about both sides of the fifth MCP joint concerning for septic arthropathy. No other bone defects are observed but assessment is quite limited. Diffuse edema without underlying fluid collections. Electronically Signed   By: Telford Nab M.D.   On: 10/24/2021 00:03     Assessment/Plan: New onset fever in a patient who has been in the hospital for the past 3 weeks as she is homeless and on dialysis D.D of fever HD cath related VS pneumonia  VS ??Left shoulder septic arthritis Do not see any infection at the site of the previous dog bite left hand Some edema but that is likely due to getting IV  The Avfistula/graft does not seem to be infected- could do ultrasound left forearm to assess the graft and any collection Recommend imaging ? CT of the left shoulder joint Pt is currently on vanco and cefepime If no cause is found and she continues to have fever will have to remove HD cath     Dog bite left finger/hand- has been treated  appropriately with Augmentin - completed treatment . Clinically no evidence of infection CT hand done today was a poor study Pt seen by ortho-    ?HTn on a amlodipine   DM on insulin  Discussed the management with the patient

## 2021-10-25 NOTE — Progress Notes (Signed)
[facility]  Dialysis Note   Subjective:   Patient seen sitting up in bed Currently eating breakfast Denies nausea, vomiting and shortness of breath  T max 100.53F  Objective:  Vital signs in last 24 hours:  Temp:  [98.1 F (36.7 C)-100.7 F (38.2 C)] 99.2 F (37.3 C) (06/14 0835) Pulse Rate:  [73-98] 78 (06/14 0835) Resp:  [15-32] 16 (06/14 0835) BP: (114-138)/(50-103) 114/58 (06/14 0835) SpO2:  [90 %-97 %] 92 % (06/14 0835) Weight:  [82.6 kg-84.2 kg] 84.2 kg (06/14 0500)  Weight change: -1.6 kg Filed Weights   10/24/21 0943 10/24/21 1307 10/25/21 0500  Weight: 84.1 kg 82.6 kg 84.2 kg    Intake/Output: I/O last 3 completed shifts: In: 1224.5 [IV Piggyback:1224.5] Out: 1000 [Other:1000]   Intake/Output this shift:  No intake/output data recorded.  Physical Exam: General: NAD,   Head: Normocephalic, atraumatic. Moist oral mucosal membranes  Eyes: Anicteric  Lungs:  Clear to auscultation, normal effort, congested cough  Heart: Regular rate and rhythm  Abdomen:  Soft, nontender  Extremities:  No peripheral edema.  Neurologic: Nonfocal, moving all four extremities  Skin: No lesions, left hand ring finger edema  Access: Right PermCath    Basic Metabolic Panel: Recent Labs  Lab 10/19/21 1004 10/23/21 1605 10/23/21 2338 10/24/21 0956 10/25/21 0437  NA 137 133*  --  134* 135  K 4.5 4.2  --  4.0 3.3*  CL 100 98  --  97* 100  CO2 29 30  --  25 29  GLUCOSE 153* 103*  --  129* 118*  BUN 32* 31*  --  36* 20  CREATININE 5.51* 5.17* 5.41* 5.56* 3.46*  CALCIUM 8.7* 8.6*  --  8.4* 8.3*  PHOS 3.8  --   --  3.0  --      Liver Function Tests: Recent Labs  Lab 10/19/21 1004 10/23/21 1605 10/24/21 0956  AST  --  15 15  ALT  --  14 13  ALKPHOS  --  101 96  BILITOT  --  0.7 0.8  PROT  --  6.7 6.3*  ALBUMIN 2.6* 2.7* 2.5*    No results for input(s): "LIPASE", "AMYLASE" in the last 168 hours. No results for input(s): "AMMONIA" in the last 168  hours.  CBC: Recent Labs  Lab 10/19/21 1004 10/23/21 1605 10/24/21 0956 10/25/21 0437  WBC 7.8 22.9* 18.2* 11.7*  NEUTROABS  --  19.6*  --   --   HGB 9.4* 9.6* 8.9* 8.8*  HCT 31.0* 31.3* 28.4* 27.5*  MCV 96.3 96.0 95.0 94.2  PLT 251 250 238 211     Cardiac Enzymes: No results for input(s): "CKTOTAL", "CKMB", "CKMBINDEX", "TROPONINI" in the last 168 hours.  BNP: Invalid input(s): "POCBNP"  CBG: Recent Labs  Lab 10/24/21 0808 10/24/21 1716 10/24/21 2032 10/25/21 0834  GLUCAP 93 139* 146* 85     Microbiology: Results for orders placed or performed during the hospital encounter of 09/30/21  Resp Panel by RT-PCR (Flu A&B, Covid) Nasopharyngeal Swab     Status: None   Collection Time: 09/30/21  9:34 AM   Specimen: Nasopharyngeal Swab; Nasopharyngeal(NP) swabs in vial transport medium  Result Value Ref Range Status   SARS Coronavirus 2 by RT PCR NEGATIVE NEGATIVE Final    Comment: (NOTE) SARS-CoV-2 target nucleic acids are NOT DETECTED.  The SARS-CoV-2 RNA is generally detectable in upper respiratory specimens during the acute phase of infection. The lowest concentration of SARS-CoV-2 viral copies this assay can detect is  138 copies/mL. A negative result does not preclude SARS-Cov-2 infection and should not be used as the sole basis for treatment or other patient management decisions. A negative result may occur with  improper specimen collection/handling, submission of specimen other than nasopharyngeal swab, presence of viral mutation(s) within the areas targeted by this assay, and inadequate number of viral copies(<138 copies/mL). A negative result must be combined with clinical observations, patient history, and epidemiological information. The expected result is Negative.  Fact Sheet for Patients:  EntrepreneurPulse.com.au  Fact Sheet for Healthcare Providers:  IncredibleEmployment.be  This test is no t yet approved or  cleared by the [place] FDA and  has been authorized for detection and/or diagnosis of SARS-CoV-2 by FDA under an Emergency Use Authorization (EUA). This EUA will remain  in effect (meaning this test can be used) for the duration of the COVID-19 declaration under Section 564(b)(1) of the Act, 21 U.S.C.section 360bbb-3(b)(1), unless the authorization is terminated  or revoked sooner.       Influenza A by PCR NEGATIVE NEGATIVE Final   Influenza B by PCR NEGATIVE NEGATIVE Final    Comment: (NOTE) The Xpert Xpress SARS-CoV-2/FLU/RSV plus assay is intended as an aid in the diagnosis of influenza from Nasopharyngeal swab specimens and should not be used as a sole basis for treatment. Nasal washings and aspirates are unacceptable for Xpert Xpress SARS-CoV-2/FLU/RSV testing.  Fact Sheet for Patients: EntrepreneurPulse.com.au  Fact Sheet for Healthcare Providers: IncredibleEmployment.be  This test is not yet approved or cleared by the [place] FDA and has been authorized for detection and/or diagnosis of SARS-CoV-2 by FDA under an Emergency Use Authorization (EUA). This EUA will remain in effect (meaning this test can be used) for the duration of the COVID-19 declaration under Section 564(b)(1) of the Act, 21 U.S.C. section 360bbb-3(b)(1), unless the authorization is terminated or revoked.  Performed at [facility], [address] [place], [place] [address]   SARS Coronavirus 2 by RT PCR (hospital order, performed in [facility] hospital lab) *cepheid single result test* Anterior Nasal Swab     Status: None   Collection Time: 10/23/21  4:06 PM   Specimen: Anterior Nasal Swab  Result Value Ref Range Status   SARS Coronavirus 2 by RT PCR NEGATIVE NEGATIVE Final    Comment: (NOTE) SARS-CoV-2 target nucleic acids are NOT DETECTED.  The SARS-CoV-2 RNA is generally detectable in upper and lower respiratory specimens during the  acute phase of infection. The lowest concentration of SARS-CoV-2 viral copies this assay can detect is 250 copies / mL. A negative result does not preclude SARS-CoV-2 infection and should not be used as the sole basis for treatment or other patient management decisions.  A negative result may occur with improper specimen collection / handling, submission of specimen other than nasopharyngeal swab, presence of viral mutation(s) within the areas targeted by this assay, and inadequate number of viral copies (<250 copies / mL). A negative result must be combined with clinical observations, patient history, and epidemiological information.  Fact Sheet for Patients:   https://www.patel.info/  Fact Sheet for Healthcare Providers: https://hall.com/  This test is not yet approved or  cleared by the [place] FDA and has been authorized for detection and/or diagnosis of SARS-CoV-2 by FDA under an Emergency Use Authorization (EUA).  This EUA will remain in effect (meaning this test can be used) for the duration of the COVID-19 declaration under Section 564(b)(1) of the Act, 21 U.S.C. section 360bbb-3(b)(1), unless the authorization is terminated  or revoked sooner.  Performed at [facility], [address] [place], [place] [address]   Blood culture (routine x 2)     Status: None (Preliminary result)   Collection Time: 10/23/21  4:06 PM   Specimen: BLOOD  Result Value Ref Range Status   Specimen Description BLOOD BLOOD RIGHT HAND  Final   Special Requests   Final    BOTTLES DRAWN AEROBIC AND ANAEROBIC Blood Culture adequate volume   Culture   Final    NO GROWTH 2 DAYS Performed at [facility], [address] [place], [place] [address]    Report Status PENDING  Incomplete  Culture, blood (Routine X 2) w Reflex to ID Panel     Status: None (Preliminary result)   Collection Time: 10/24/21 12:06 AM   Specimen: BLOOD   Result Value Ref Range Status   Specimen Description BLOOD LEFT HAND  Final   Special Requests   Final    BOTTLES DRAWN AEROBIC AND ANAEROBIC Blood Culture adequate volume   Culture   Final    NO GROWTH 1 DAY Performed at [facility], [address] [place], [place] [address]    Report Status PENDING  Incomplete  MRSA Next Gen by PCR, Nasal     Status: None   Collection Time: 10/24/21  2:24 PM   Specimen: Nasal Mucosa; Nasal Swab  Result Value Ref Range Status   MRSA by PCR Next Gen NOT DETECTED NOT DETECTED Final    Comment: (NOTE) The GeneXpert MRSA Assay (FDA approved for NASAL specimens only), is one component of a comprehensive MRSA colonization surveillance program. It is not intended to diagnose MRSA infection nor to guide or monitor treatment for MRSA infections. Test performance is not FDA approved in patients less than 1 years old. Performed at [facility], [place], [place] [address]   Respiratory (~20 pathogens) panel by PCR     Status: None   Collection Time: 10/24/21  2:24 PM   Specimen: Nasopharyngeal Swab; Respiratory  Result Value Ref Range Status   Adenovirus NOT DETECTED NOT DETECTED Final   Coronavirus 229E NOT DETECTED NOT DETECTED Final    Comment: (NOTE) The Coronavirus on the Respiratory Panel, DOES NOT test for the novel  Coronavirus (2019 nCoV)    Coronavirus HKU1 NOT DETECTED NOT DETECTED Final   Coronavirus NL63 NOT DETECTED NOT DETECTED Final   Coronavirus OC43 NOT DETECTED NOT DETECTED Final   Metapneumovirus NOT DETECTED NOT DETECTED Final   Rhinovirus / Enterovirus NOT DETECTED NOT DETECTED Final   Influenza A NOT DETECTED NOT DETECTED Final   Influenza B NOT DETECTED NOT DETECTED Final   Parainfluenza Virus 1 NOT DETECTED NOT DETECTED Final   Parainfluenza Virus 2 NOT DETECTED NOT DETECTED Final   Parainfluenza Virus 3 NOT DETECTED NOT DETECTED Final   Parainfluenza Virus 4 NOT DETECTED NOT DETECTED  Final   Respiratory Syncytial Virus NOT DETECTED NOT DETECTED Final   Bordetella pertussis NOT DETECTED NOT DETECTED Final   Bordetella Parapertussis NOT DETECTED NOT DETECTED Final   Chlamydophila pneumoniae NOT DETECTED NOT DETECTED Final   Mycoplasma pneumoniae NOT DETECTED NOT DETECTED Final    Comment: Performed at [facility], [place]. [address], [place], [place] [address]    Coagulation Studies: No results for input(s): "LABPROT", "INR" in the last 72 hours.  Urinalysis: No results for input(s): "COLORURINE", "LABSPEC", "PHURINE", "GLUCOSEU", "HGBUR", "BILIRUBINUR", "KETONESUR", "PROTEINUR", "UROBILINOGEN", "NITRITE", "LEUKOCYTESUR" in the last 72 hours.  Invalid input(s): "APPERANCEUR"    Imaging: CT HAND LEFT WO CONTRAST  Result Date: 10/24/2021 CLINICAL DATA:  The patient was bitten by a dog on or about 3 weeks ago persistent swelling and pain. Evaluate for septic arthritis. EXAM: CT OF THE LEFT HAND WITHOUT CONTRAST TECHNIQUE: Multidetector CT imaging of the left hand was performed according to the standard protocol. Multiplanar CT image reconstructions were also generated. RADIATION DOSE REDUCTION: This exam was performed according to the departmental dose-optimization program which includes automated exposure control, adjustment of the mA and/or kV according to patient size and/or use of iterative reconstruction technique. COMPARISON:  Only relevant comparison is left hand series 09/23/2021, left wrist series 09/30/2021. No prior cross-sectional imaging. FINDINGS: Factors affecting image quality: The patient's left hand was overlying the upper abdomen for the scan. This creates abundant streak artifact and beam hardening. The images are grainy and of low quality. IV contrast was also not given. Bones/Joint/Cartilage No displaced fracture is seen or joint dislocations. There are mild features of degenerative arthrosis of the interphalangeal joints. Best seen on the coronal  reconstruction images on series 5 axial images 29 and 30 there are small cortical defects suspected at the ulnar aspects of the fifth metatarsal head and the small finger proximal phalanx medial base centered dorsally and concerning for septic arthropathy. This could not be seen on the prior plain films. No other bone defects are observed. Other joint spaces appear maintained. Ligaments Suboptimally assessed by CT. Muscles and Tendons Very poorly seen. No obvious missing ligaments. Grossly normal muscle bulk in the hand. Soft tissues There is diffuse edema.  No underlying fluid collection is seen. IMPRESSION: Poor image quality and detail due to technique. There is evidence of ulnar-sided bone defects about both sides of the fifth MCP joint concerning for septic arthropathy. No other bone defects are observed but assessment is quite limited. Diffuse edema without underlying fluid collections. Electronically Signed   By: Telford Nab M.D.   On: 10/24/2021 00:03   DG Chest Portable 1 View  Result Date: 10/23/2021 CLINICAL DATA:  Pneumonia EXAM: PORTABLE CHEST 1 VIEW COMPARISON:  Radiograph 09/23/2021 FINDINGS: Right neck catheter tip overlies the distal superior vena cava and right atrium. Unchanged, enlarged cardiac silhouette. Increased bibasilar airspace disease, left greater than right. Persistent small pleural effusions. No pneumothorax. No acute osseous abnormality. Bilateral shoulder degenerative changes. Thoracic spondylosis. IMPRESSION: Increased bibasilar airspace disease, left greater than right, with persistent small pleural effusions. Findings could reflect multifocal pneumonia or atelectasis. Electronically Signed   By: Maurine Simmering M.D.   On: 10/23/2021 15:02     Medications:    ceFEPime (MAXIPIME) IV Stopped (10/24/21 1440)   ondansetron (ZOFRAN) IV Stopped (10/24/21 1623)   promethazine (PHENERGAN) injection (IM or IVPB)     vancomycin 200 mL/hr at 10/24/21 1712    amLODipine  10 mg  Oral QPM   calcium acetate  667 mg Oral TID WC   carvedilol  12.5 mg Oral QPM   Chlorhexidine Gluconate Cloth  6 each Topical Q0600   docusate sodium  100 mg Oral BID   feeding supplement (NEPRO CARB STEADY)  237 mL Oral TID BM   heparin  5,000 Units Subcutaneous Q12H   insulin aspart  0-15 Units Subcutaneous TID WC   metoCLOPramide (REGLAN) injection  10 mg Intravenous Once   multivitamin  1 tablet Oral QHS   pantoprazole  40 mg Oral BID   polyethylene glycol  17 g Oral Daily   scopolamine  1 patch Transdermal Q72H   sodium chloride flush  3 mL Intravenous Q12H   acetaminophen **OR** acetaminophen, guaiFENesin, HYDROcodone-acetaminophen, morphine injection, ondansetron (ZOFRAN) IV, oxyCODONE-acetaminophen, tiZANidine, topiramate  Assessment/ Plan:  Katie [name] is a 66 y.o.  female with past medical history of ESRD with hemodialysis TTS, diabetes mellitus, hypertension to the hospital seeking long term placement. She was recently admitted and received antibiotics for a dog bite.  She was discharged with a prescription for home antibiotics.  She states her caregivers were unable to retrieve antibiotics.  She states that there was an argument and that she is here seeking long-term placement.  Principal Problem:   Sepsis ([place]) Active Problems:   Hypertension   Type 2 diabetes mellitus ([place])   ESRD (end stage renal disease) ([place])   Financial difficulty   Anemia in chronic kidney disease   Cellulitis   Bent bone fracture of ulna   Hospital acquired PNA   [facility]/TTS/right PermCath  End Stage Renal Disease on hemodialysis:  Dialysis received yesterday, UF 1L achieved.  Next treatment scheduled for Thursday. We will continue to monitor discharge plan to assess outpatient needs.  No results found for: "POTASSIUM"  Intake/Output Summary (Last 24 hours) at 10/25/2021 0958 Last data filed at 10/24/2021 1900 Gross per 24 hour  Intake 1224.46 ml  Output 1000 ml  Net  224.46 ml    2. Hypertension with chronic kidney disease:   Currently receiving amlodipine and carvedilol.     BP (!) 114/58 (BP Location: Right Arm)   Pulse 78   Temp 99.2 F (37.3 C)   Resp 16   Ht 5\' 6"  (1.676 m)   Wt 84.2 kg   SpO2 92%   BMI 29.96 kg/m   3. Anemia of chronic kidney disease/ kidney injury/chronic disease/acute blood loss:   Lab Results  Component Value Date   HGB 8.8 (L) 10/25/2021   Globin remains below desired target.  Will prescribe low-dose EPO with dialysis treatments.  4. Secondary Hyperparathyroidism:     Lab Results  Component Value Date   PTH 70 (H) 05/12/2020   CALCIUM 8.3 (L) 10/25/2021   PHOS 3.0 10/24/2021   We will continue to monitor bone minerals during this admission.  Calcium and phosphorus within acceptable range at this time continue calcium acetate with meals    LOS: 2 [facility] 6/14/20239:58 AM

## 2021-10-26 ENCOUNTER — Inpatient Hospital Stay: Payer: Medicaid Other

## 2021-10-26 DIAGNOSIS — A419 Sepsis, unspecified organism: Secondary | ICD-10-CM | POA: Diagnosis not present

## 2021-10-26 DIAGNOSIS — N186 End stage renal disease: Secondary | ICD-10-CM | POA: Diagnosis not present

## 2021-10-26 DIAGNOSIS — J189 Pneumonia, unspecified organism: Secondary | ICD-10-CM | POA: Diagnosis not present

## 2021-10-26 DIAGNOSIS — W540XXA Bitten by dog, initial encounter: Secondary | ICD-10-CM | POA: Diagnosis not present

## 2021-10-26 DIAGNOSIS — Z87891 Personal history of nicotine dependence: Secondary | ICD-10-CM

## 2021-10-26 DIAGNOSIS — R509 Fever, unspecified: Secondary | ICD-10-CM | POA: Diagnosis not present

## 2021-10-26 LAB — GLUCOSE, CAPILLARY
Glucose-Capillary: 93 mg/dL (ref 70–99)
Glucose-Capillary: 112 mg/dL — ABNORMAL HIGH (ref 70–99)
Glucose-Capillary: 139 mg/dL — ABNORMAL HIGH (ref 70–99)
Glucose-Capillary: 127 mg/dL — ABNORMAL HIGH (ref 70–99)

## 2021-10-26 MED ORDER — LACTULOSE 10 GM/15ML PO SOLN
20.0000 g | Freq: Three times a day (TID) | ORAL | Status: DC
  Administered 2021-10-26 – 2021-10-27 (×3): 20 g via ORAL
  Filled 2021-10-26 (×3): qty 30

## 2021-10-26 MED ORDER — HEPARIN SODIUM (PORCINE) 1000 UNIT/ML IJ SOLN
INTRAMUSCULAR | Status: AC
  Filled 2021-10-26: qty 10

## 2021-10-26 MED ORDER — SENNOSIDES-DOCUSATE SODIUM 8.6-50 MG PO TABS
2.0000 | ORAL_TABLET | Freq: Two times a day (BID) | ORAL | Status: DC
  Administered 2021-10-26 – 2021-10-27 (×2): 2 via ORAL
  Filled 2021-10-26 (×2): qty 2

## 2021-10-26 MED ORDER — LACTULOSE 10 GM/15ML PO SOLN
20.0000 g | Freq: Once | ORAL | Status: AC
  Administered 2021-10-26: 20 g via ORAL
  Filled 2021-10-26: qty 30

## 2021-10-26 MED ORDER — ONDANSETRON HCL 4 MG/2ML IJ SOLN
4.0000 mg | Freq: Once | INTRAMUSCULAR | Status: AC
  Administered 2021-10-26: 4 mg via INTRAVENOUS

## 2021-10-26 MED ORDER — BISACODYL 10 MG RE SUPP
10.0000 mg | Freq: Every day | RECTAL | Status: DC
  Administered 2021-10-26 – 2021-10-27 (×2): 10 mg via RECTAL
  Filled 2021-10-26 (×4): qty 1

## 2021-10-26 MED ORDER — ONDANSETRON HCL 4 MG/2ML IJ SOLN
INTRAMUSCULAR | Status: AC
  Filled 2021-10-26: qty 2

## 2021-10-26 NOTE — Progress Notes (Signed)
OT Cancellation Note  Patient Details Name: Katie [name] MRN: [id number] DOB: June 30, 1955   Cancelled Treatment:    Reason Eval/Treat Not Completed: Patient at procedure or test/ unavailable. Pt at dialysis. Will re-attempt OT tx at later date/time as pt is available and appropriate.   Ardeth Perfect., MPH, MS, OTR/L ascom [phone] 10/26/21, 9:46 AM

## 2021-10-26 NOTE — Progress Notes (Signed)
ID Patient complains of nausea and vomiting Poor appetite Pain left hand much improved  painful on pressing Left shoulder pain persist with decreased mobility   On examination BP 117/64 (BP Location: Right Arm)   Pulse 81   Temp 99.1 F (37.3 C) (Oral)   Resp 18   Ht 5\' 6"  (1.676 m)   Wt 82.6 kg   SpO2 90%   BMI 29.39 kg/m   Patient looks ill Not in distress Chest bilateral air entry Decreased bases rt > left Heart sound S1-S2 Abdomen soft distention Left hand minimal swelling Swelling over the forearm which is secondary to IV site use previously Left arm AV graft Left shoulder limited abduction.  No erythema CNS nonfocal HD catheter right IJ  Labs     Latest Ref Rng & Units 10/25/2021    4:37 AM 10/24/2021    9:56 AM 10/23/2021    4:05 PM  CBC  WBC 4.0 - 10.5 K/uL 11.7  18.2  22.9   Hemoglobin 12.0 - 15.0 g/dL 8.8  8.9  9.6   Hematocrit 36.0 - 46.0 % 27.5  28.4  31.3   Platelets 150 - 400 K/uL 211  238  250        Latest Ref Rng & Units 10/25/2021    4:37 AM 10/24/2021    9:56 AM 10/23/2021   11:38 PM  CMP  Glucose 70 - 99 mg/dL 118  129    BUN 8 - 23 mg/dL 20  36    Creatinine 0.44 - 1.00 mg/dL 3.46  5.56  5.41   Sodium 135 - 145 mmol/L 135  134    Potassium 3.5 - 5.1 mmol/L 3.3  4.0    Chloride 98 - 111 mmol/L 100  97    CO2 22 - 32 mmol/L 29  25    Calcium 8.9 - 10.3 mg/dL 8.3  8.4    Total Protein 6.5 - 8.1 g/dL  6.3    Total Bilirubin 0.3 - 1.2 mg/dL  0.8    Alkaline Phos 38 - 126 U/L  96    AST 15 - 41 U/L  15    ALT 0 - 44 U/L  13      Micro 10/23/2021 blood culture no growth Respiratory PCR nonreactive MRSA nares negative  Radiology Personally reviewed by me X-ray abdomen Large volume of stool throughout the colon and rectum. Moderate gastric distention without small bowel dilatation.  CT chest rt lower lobe consolidation  CT left shoulder no evidence of septic arthritis or osteomyelitis Calcific tendinopathy involving the  supraspinatus attachment site.   Impression/recommendation New onset fever in a patient was been in the hospital for the past 3 weeks. Patient is on dialysis Differential diagnosis of fever includes healthcare associated pneumonia versus HD catheter  VS left shoulder septic arthritis CT scan of the left shoulder has ruled out septic arthritis. Blood cultures negative so far Patient has bilateral lower lobe consolidation right more than left Patient is currently on vancomycin and cefepime day 4 Leukocytosis has nearly resolved Fever is also improved Continue for another 3 days and can be discontinued on 09/29/2021  If fever recurs after stopping the antibiotic then will have to look at the HD catheter removal  Left hand dog bite infection has resolved.  She was appropriately treated with Unasyn and Augmentin before  Nausea and vomiting today secondary to gastric dilatation and severe constipation  Hypertension on amlodipine   Diabetes mellitus on insulin  Discussed the management  with the patient and the hospitalist.  [facility] covering by phone on Friday and the weekend.  Call if needed

## 2021-10-26 NOTE — Progress Notes (Signed)
  Progress Note   Patient: Katie [name] DOB: 16-Sep-1955 DOA: 09/30/2021     3 DOS: the patient was seen and examined on 10/26/2021   Brief hospital course: Katie [name] is a 66 y.o. female with medical history significant of ESRD, DM II, homelessness. Had been admitted 05/13-05/17 for sepsis d/t cellulits from animal bite to arm, discharged on po abx and was unable to fill the Rx, was kicked out of where she was living, no way to get HD.  Patient has been in the ED since arriving the hospital.  She was admitted to the hospital again on 10/23/2021 as patient developed cough short of breath and fever.  Chest x-ray showed pneumonia.  Patient was diagnosed with sepsis and pneumonia, was started on cefepime and vancomycin. Had MRI performed on 6/14 did not show any osteomyelitis. Patient is currently homeless, has a severe weakness.  No discharge option at this time.  Assessment and Plan: Sepsis. Healthcare associate pneumonia. Patient condition appears to be improving.  Patient has been seen by ID, currently on vancomycin and cefepime. Patient no longer has any fever, blood culture has been negative, does not have high suspicion for catheter associated infection. Hand MRI has ruled out septic joint or osteomyelitis.  Recent dog bites with cellulitis. Condition has improved.   End stage renal disease on dialysis. Anemia of chronic kidney disease. Hypokalemia. Patient received lower dose potassium, has increased crackles in the base, will be dialyzed again to remove fluids.  Type 2 diabetes. Continue current regimen.  Essential hypertension. Continue to follow      Subjective:  Patient feels well today, no new complaints.  Physical Exam: Vitals:   10/26/21 1115 10/26/21 1130 10/26/21 1145 10/26/21 1200  BP: (!) 109/45 (!) 110/47 (!) 108/50 (!) 103/46  Pulse: 82 83 83 81  Resp: (!) 23 (!) 24 (!) 23 20  Temp:      TempSrc:      SpO2:      Weight:      Height:        General exam: Appears calm and comfortable  Respiratory system: Coarse in the bases respiratory effort normal. Cardiovascular system: S1 & S2 heard, RRR.2/6 SM at RUSB,  No JVD, rubs, gallops or clicks. No pedal edema. Gastrointestinal system: Abdomen is nondistended, soft and nontender. No organomegaly or masses felt. Normal bowel sounds heard. Central nervous system: Alert and oriented. No focal neurological deficits. Extremities: Symmetric 5 x 5 power. Skin: No rashes, lesions or ulcers Psychiatry: Judgement and insight appear normal. Mood & affect appropriate.   Data Reviewed:  MRI results reviewed, lab results reviewed.  Family Communication:   Disposition: Status is: Inpatient Remains inpatient appropriate because: Severity of disease, IV treatment, unsafe discharge.  Planned Discharge Destination:  Pending    Time spent: 35 minutes  Author: Sharen Hones, MD 10/26/2021 12:21 PM  For on call review [email or web address].

## 2021-10-26 NOTE — Progress Notes (Signed)
Hemodialysis Post Treatment Note  26 October 2021  Access: RIJ CVC  UF Removed:  1701 ml BFR: 400  Next Scheduled Treatment: 10/28/21  Note: Patient presents to treatment with complaint of nausea medicated, CVC intact, no signs of infection. CVC functions well, BFR maintained at prescribed rate throughout the course of treatment. Targeted UF achieved. Patient received prescribed Vancomycin without adverse reaction. Patient without acute concerns, report given to primary nurse, patient transported to assigned room.

## 2021-10-26 NOTE — Progress Notes (Signed)
[facility]  Dialysis Note   Subjective:   Patient seen and evaluated during dialysis   HEMODIALYSIS FLOWSHEET:  Blood Flow Rate (mL/min): 400 mL/min Arterial Pressure (mmHg): -170 mmHg Venous Pressure (mmHg): 130 mmHg Transmembrane Pressure (mmHg): 60 mmHg Ultrafiltration Rate (mL/min): 830 mL/min (Vancomycin started) Dialysate Flow Rate (mL/min): 500 ml/min Conductivity: 13.9 Conductivity: 13.9 Dialysis Fluid Bolus: Normal Saline Bolus Amount (mL): 250 mL  No complaints at this time  Afebrile  Objective:  Vital signs in last 24 hours:  Temp:  [98 F (36.7 C)-99.1 F (37.3 C)] 99 F (37.2 C) (06/15 0903) Pulse Rate:  [71-86] 82 (06/15 1115) Resp:  [15-27] 23 (06/15 1115) BP: (104-131)/(45-69) 109/45 (06/15 1115) SpO2:  [92 %-97 %] 94 % (06/15 0756) Weight:  [84.2 kg-84.9 kg] 84.9 kg (06/15 0904)  Weight change: 0.1 kg Filed Weights   10/25/21 0500 10/26/21 0500 10/26/21 0904  Weight: 84.2 kg 84.2 kg 84.9 kg    Intake/Output: No intake/output data recorded.   Intake/Output this shift:  No intake/output data recorded.  Physical Exam: General: NAD  Head: Normocephalic, atraumatic. Moist oral mucosal membranes  Eyes: Anicteric  Lungs:  Clear to auscultation, normal effort, congested cough  Heart: Regular rate and rhythm  Abdomen:  Soft, nontender  Extremities:  1+ peripheral edema.  Neurologic: Nonfocal, moving all four extremities  Skin: No lesions, left hand ring finger edema  Access: Right PermCath    Basic Metabolic Panel: Recent Labs  Lab 10/23/21 1605 10/23/21 2338 10/24/21 0956 10/25/21 0437  NA 133*  --  134* 135  K 4.2  --  4.0 3.3*  CL 98  --  97* 100  CO2 30  --  25 29  GLUCOSE 103*  --  129* 118*  BUN 31*  --  36* 20  CREATININE 5.17* 5.41* 5.56* 3.46*  CALCIUM 8.6*  --  8.4* 8.3*  PHOS  --   --  3.0  --      Liver Function Tests: Recent Labs  Lab 10/23/21 1605 10/24/21 0956  AST 15 15  ALT 14 13  ALKPHOS  101 96  BILITOT 0.7 0.8  PROT 6.7 6.3*  ALBUMIN 2.7* 2.5*    No results for input(s): "LIPASE", "AMYLASE" in the last 168 hours. No results for input(s): "AMMONIA" in the last 168 hours.  CBC: Recent Labs  Lab 10/23/21 1605 10/24/21 0956 10/25/21 0437  WBC 22.9* 18.2* 11.7*  NEUTROABS 19.6*  --   --   HGB 9.6* 8.9* 8.8*  HCT 31.3* 28.4* 27.5*  MCV 96.0 95.0 94.2  PLT 250 238 211     Cardiac Enzymes: No results for input(s): "CKTOTAL", "CKMB", "CKMBINDEX", "TROPONINI" in the last 168 hours.  BNP: Invalid input(s): "POCBNP"  CBG: Recent Labs  Lab 10/25/21 0834 10/25/21 1207 10/25/21 1649 10/25/21 2003 10/26/21 0723  GLUCAP 85 118* 142* 87 93     Microbiology: Results for orders placed or performed during the hospital encounter of 09/30/21  Resp Panel by RT-PCR (Flu A&B, Covid) Nasopharyngeal Swab     Status: None   Collection Time: 09/30/21  9:34 AM   Specimen: Nasopharyngeal Swab; Nasopharyngeal(NP) swabs in vial transport medium  Result Value Ref Range Status   SARS Coronavirus 2 by RT PCR NEGATIVE NEGATIVE Final    Comment: (NOTE) SARS-CoV-2 target nucleic acids are NOT DETECTED.  The SARS-CoV-2 RNA is generally detectable in upper respiratory specimens during the acute phase of infection. The lowest concentration of SARS-CoV-2 viral copies this assay can  detect is 138 copies/mL. A negative result does not preclude SARS-Cov-2 infection and should not be used as the sole basis for treatment or other patient management decisions. A negative result may occur with  improper specimen collection/handling, submission of specimen other than nasopharyngeal swab, presence of viral mutation(s) within the areas targeted by this assay, and inadequate number of viral copies(<138 copies/mL). A negative result must be combined with clinical observations, patient history, and epidemiological information. The expected result is Negative.  Fact Sheet for Patients:   EntrepreneurPulse.com.au  Fact Sheet for Healthcare Providers:  IncredibleEmployment.be  This test is no t yet approved or cleared by the [place] FDA and  has been authorized for detection and/or diagnosis of SARS-CoV-2 by FDA under an Emergency Use Authorization (EUA). This EUA will remain  in effect (meaning this test can be used) for the duration of the COVID-19 declaration under Section 564(b)(1) of the Act, 21 U.S.C.section 360bbb-3(b)(1), unless the authorization is terminated  or revoked sooner.       Influenza A by PCR NEGATIVE NEGATIVE Final   Influenza B by PCR NEGATIVE NEGATIVE Final    Comment: (NOTE) The Xpert Xpress SARS-CoV-2/FLU/RSV plus assay is intended as an aid in the diagnosis of influenza from Nasopharyngeal swab specimens and should not be used as a sole basis for treatment. Nasal washings and aspirates are unacceptable for Xpert Xpress SARS-CoV-2/FLU/RSV testing.  Fact Sheet for Patients: EntrepreneurPulse.com.au  Fact Sheet for Healthcare Providers: IncredibleEmployment.be  This test is not yet approved or cleared by the [place] FDA and has been authorized for detection and/or diagnosis of SARS-CoV-2 by FDA under an Emergency Use Authorization (EUA). This EUA will remain in effect (meaning this test can be used) for the duration of the COVID-19 declaration under Section 564(b)(1) of the Act, 21 U.S.C. section 360bbb-3(b)(1), unless the authorization is terminated or revoked.  Performed at [facility], [address] [place], [place] [address]   SARS Coronavirus 2 by RT PCR (hospital order, performed in [facility] hospital lab) *cepheid single result test* Anterior Nasal Swab     Status: None   Collection Time: 10/23/21  4:06 PM   Specimen: Anterior Nasal Swab  Result Value Ref Range Status   SARS Coronavirus 2 by RT PCR NEGATIVE NEGATIVE Final     Comment: (NOTE) SARS-CoV-2 target nucleic acids are NOT DETECTED.  The SARS-CoV-2 RNA is generally detectable in upper and lower respiratory specimens during the acute phase of infection. The lowest concentration of SARS-CoV-2 viral copies this assay can detect is 250 copies / mL. A negative result does not preclude SARS-CoV-2 infection and should not be used as the sole basis for treatment or other patient management decisions.  A negative result may occur with improper specimen collection / handling, submission of specimen other than nasopharyngeal swab, presence of viral mutation(s) within the areas targeted by this assay, and inadequate number of viral copies (<250 copies / mL). A negative result must be combined with clinical observations, patient history, and epidemiological information.  Fact Sheet for Patients:   https://www.patel.info/  Fact Sheet for Healthcare Providers: https://hall.com/  This test is not yet approved or  cleared by the [place] FDA and has been authorized for detection and/or diagnosis of SARS-CoV-2 by FDA under an Emergency Use Authorization (EUA).  This EUA will remain in effect (meaning this test can be used) for the duration of the COVID-19 declaration under Section 564(b)(1) of the Act, 21 U.S.C. section 360bbb-3(b)(1), unless the authorization  is terminated or revoked sooner.  Performed at [facility], [address] [place], [place] [address]   Blood culture (routine x 2)     Status: None (Preliminary result)   Collection Time: 10/23/21  4:06 PM   Specimen: BLOOD  Result Value Ref Range Status   Specimen Description BLOOD BLOOD RIGHT HAND  Final   Special Requests   Final    BOTTLES DRAWN AEROBIC AND ANAEROBIC Blood Culture adequate volume   Culture   Final    NO GROWTH 3 DAYS Performed at [facility], [address], [place], [place] [address]    Report Status PENDING   Incomplete  Culture, blood (Routine X 2) w Reflex to ID Panel     Status: None (Preliminary result)   Collection Time: 10/24/21 12:06 AM   Specimen: BLOOD  Result Value Ref Range Status   Specimen Description BLOOD LEFT HAND  Final   Special Requests   Final    BOTTLES DRAWN AEROBIC AND ANAEROBIC Blood Culture adequate volume   Culture   Final    NO GROWTH 2 DAYS Performed at [facility], [address] [place], [place] [address]    Report Status PENDING  Incomplete  MRSA Next Gen by PCR, Nasal     Status: None   Collection Time: 10/24/21  2:24 PM   Specimen: Nasal Mucosa; Nasal Swab  Result Value Ref Range Status   MRSA by PCR Next Gen NOT DETECTED NOT DETECTED Final    Comment: (NOTE) The GeneXpert MRSA Assay (FDA approved for NASAL specimens only), is one component of a comprehensive MRSA colonization surveillance program. It is not intended to diagnose MRSA infection nor to guide or monitor treatment for MRSA infections. Test performance is not FDA approved in patients less than 62 years old. Performed at [facility], [place], [place] [address]   Respiratory (~20 pathogens) panel by PCR     Status: None   Collection Time: 10/24/21  2:24 PM   Specimen: Nasopharyngeal Swab; Respiratory  Result Value Ref Range Status   Adenovirus NOT DETECTED NOT DETECTED Final   Coronavirus 229E NOT DETECTED NOT DETECTED Final    Comment: (NOTE) The Coronavirus on the Respiratory Panel, DOES NOT test for the novel  Coronavirus (2019 nCoV)    Coronavirus HKU1 NOT DETECTED NOT DETECTED Final   Coronavirus NL63 NOT DETECTED NOT DETECTED Final   Coronavirus OC43 NOT DETECTED NOT DETECTED Final   Metapneumovirus NOT DETECTED NOT DETECTED Final   Rhinovirus / Enterovirus NOT DETECTED NOT DETECTED Final   Influenza A NOT DETECTED NOT DETECTED Final   Influenza B NOT DETECTED NOT DETECTED Final   Parainfluenza Virus 1 NOT DETECTED NOT DETECTED Final    Parainfluenza Virus 2 NOT DETECTED NOT DETECTED Final   Parainfluenza Virus 3 NOT DETECTED NOT DETECTED Final   Parainfluenza Virus 4 NOT DETECTED NOT DETECTED Final   Respiratory Syncytial Virus NOT DETECTED NOT DETECTED Final   Bordetella pertussis NOT DETECTED NOT DETECTED Final   Bordetella Parapertussis NOT DETECTED NOT DETECTED Final   Chlamydophila pneumoniae NOT DETECTED NOT DETECTED Final   Mycoplasma pneumoniae NOT DETECTED NOT DETECTED Final    Comment: Performed at [facility], [place]. [address], [place], [place] [address]    Coagulation Studies: No results for input(s): "LABPROT", "INR" in the last 72 hours.  Urinalysis: No results for input(s): "COLORURINE", "LABSPEC", "PHURINE", "GLUCOSEU", "HGBUR", "BILIRUBINUR", "KETONESUR", "PROTEINUR", "UROBILINOGEN", "NITRITE", "LEUKOCYTESUR" in the last  72 hours.  Invalid input(s): "APPERANCEUR"    Imaging: MR HAND LEFT W WO CONTRAST  Result Date: 10/26/2021 CLINICAL DATA:  Dog bite.  Concern for osteomyelitis EXAM: MRI OF THE LEFT HAND WITHOUT AND WITH CONTRAST TECHNIQUE: Multiplanar, multisequence MR imaging of the left hand was performed before and after the administration of intravenous contrast. CONTRAST:  41mL GADAVIST GADOBUTROL 1 MMOL/ML IV SOLN COMPARISON:  CT 10/23/2021, x-ray 09/30/2021 FINDINGS: Technical Note: Despite efforts by the technologist and patient, motion artifact is present on today's exam and could not be eliminated. This reduces exam sensitivity and specificity. Bones/Joint/Cartilage No acute fracture. No dislocation. No focal site of bone destruction or erosion. No bone marrow edema or marrow replacement. Mild degenerative changes within the hand and wrist. No joint effusion or evidence of synovitis. Ligaments Collateral ligaments of the hand are intact. Muscles and Tendons Grossly intact flexor and extensor tendons without evidence of tendon tear or tenosynovitis. No intramuscular fluid collections. Soft  tissues Dorsal subcutaneous edema along the ulnar aspect of the hand and wrist. No organized or rim enhancing fluid collections. IMPRESSION: 1. Limited exam. 2. Dorsal subcutaneous edema along the ulnar aspect of the hand and wrist suggesting cellulitis. No organized or rim enhancing fluid collections. 3. No acute osseous findings. Specifically, no evidence of osteomyelitis within the limitations of this exam. Electronically Signed   By: Davina Poke D.O.   On: 10/26/2021 08:24     Medications:    ceFEPime (MAXIPIME) IV Stopped (10/24/21 1440)   ondansetron (ZOFRAN) IV Stopped (10/25/21 1422)   promethazine (PHENERGAN) injection (IM or IVPB)     vancomycin 1,000 mg (10/26/21 1104)    amLODipine  10 mg Oral QPM   calcium acetate  667 mg Oral TID WC   carvedilol  12.5 mg Oral QPM   Chlorhexidine Gluconate Cloth  6 each Topical Q0600   docusate sodium  100 mg Oral BID   epoetin (EPOGEN/PROCRIT) injection  4,000 Units Intravenous Q T,Th,Sa-HD   feeding supplement (NEPRO CARB STEADY)  237 mL Oral TID BM   heparin  5,000 Units Subcutaneous Q12H   heparin sodium (porcine)       insulin aspart  0-15 Units Subcutaneous TID WC   lactulose  20 g Oral Once   metoCLOPramide (REGLAN) injection  10 mg Intravenous Once   multivitamin  1 tablet Oral QHS   pantoprazole  40 mg Oral BID   polyethylene glycol  17 g Oral Daily   scopolamine  1 patch Transdermal Q72H   sodium chloride flush  3 mL Intravenous Q12H   acetaminophen **OR** acetaminophen, guaiFENesin, heparin sodium (porcine), HYDROcodone-acetaminophen, morphine injection, ondansetron (ZOFRAN) IV, oxyCODONE-acetaminophen, tiZANidine, topiramate  Assessment/ Plan:  Katie [name] is a 66 y.o.  female with past medical history of ESRD with hemodialysis TTS, diabetes mellitus, hypertension to the hospital seeking long term placement. She was recently admitted and received antibiotics for a dog bite.  She was discharged with a prescription for  home antibiotics.  She states her caregivers were unable to retrieve antibiotics.  She states that there was an argument and that she is here seeking long-term placement.  Principal Problem:   Sepsis ([place]) Active Problems:   Hypertension   Type 2 diabetes mellitus (HCC)   ESRD (end stage renal disease) ([place])   Financial difficulty   Anemia in chronic kidney disease   Cellulitis   Bent bone fracture of ulna   Hospital acquired PNA   [facility]/TTS/right PermCath  End Stage  Renal Disease on hemodialysis:  Receiving dialysis today, UF goal 2L as tolerated. Next treatment scheduled as Saturday. We will continue to monitor discharge plan to assess outpatient needs.  No results found for: "POTASSIUM" No intake or output data in the 24 hours ending 10/26/21 1123   2. Hypertension with chronic kidney disease:   Currently receiving amlodipine and carvedilol.     BP (!) 109/45   Pulse 82   Temp 99 F (37.2 C) (Oral)   Resp (!) 23   Ht 5\' 6"  (1.676 m)   Wt 84.9 kg   SpO2 94%   BMI 30.21 kg/m   3. Anemia of chronic kidney disease/ kidney injury/chronic disease/acute blood loss:   Lab Results  Component Value Date   HGB 8.8 (L) 10/25/2021   Hgb below desired target.  Continue low-dose EPO with dialysis treatments.  4. Secondary Hyperparathyroidism:     Lab Results  Component Value Date   PTH 70 (H) 05/12/2020   CALCIUM 8.3 (L) 10/25/2021   PHOS 3.0 10/24/2021   We will continue to monitor bone minerals during this admission.  Continue calcium acetate with meals    LOS: [facility] 6/15/202311:23 AM

## 2021-10-26 NOTE — Progress Notes (Signed)
Nutrition Follow-up  DOCUMENTATION CODES:   Not applicable  INTERVENTION:   -Continue renal MVI -Continue Nepro Shake po TID, each supplement provides 425 kcal and 19 grams protein   NUTRITION DIAGNOSIS:   Increased nutrient needs related to catabolic illness (ESRD on HD) as evidenced by estimated needs.  Ongoing  GOAL:   Patient will meet greater than or equal to 90% of their needs  Progressing   MONITOR:   PO intake, Supplement acceptance, Labs, Weight trends, Skin, I & O's  REASON FOR ASSESSMENT:   Malnutrition Screening Tool    ASSESSMENT:   66 y/o female with h/o ESRD on HD, DM, HTN, homelessness and recent dog bite who is admitted with PNA, sepsis, cellulitis of left hand with septic arthropathy and ulnar fracture.  Reviewed I/O's: -1.5 L x 24 hours and -3.7 L since 10/12/21   Spoke with pt at bedside, who reports decreased oral intake secondary to nausea. Pt reports she has been given some zofran with minimal effect. Per pt, she just vomited prior to RD visit. Pt reports all that she has been able to keep down today is jello and grapes. Pt also consumed about 50% of a Nepro shake during visit.   Pt reports her lab values and weight are "always up and down". She estimates that she has had issues with vomiting over the past week. Prior to acute illness, pt reports fair appetite, consuming 2 meals per day.   Discussed importance of good meal and supplement intake to promote healing. Pt amenable to continue Nepro supplements.   Medications reviewed and include colace and reglan.   Labs reviewed: K: 3.3, CBGS: 87-142 (inpatient orders for glycemic control are 0-15 units insulin aspart TID with meals).    NUTRITION - FOCUSED PHYSICAL EXAM:  Flowsheet Row Most Recent Value  Orbital Region No depletion  Upper Arm Region No depletion  Thoracic and Lumbar Region No depletion  Buccal Region No depletion  Temple Region No depletion  Clavicle Bone Region No depletion   Clavicle and Acromion Bone Region No depletion  Scapular Bone Region No depletion  Dorsal Hand No depletion  Patellar Region Mild depletion  Anterior Thigh Region Mild depletion  Posterior Calf Region Mild depletion  Edema (RD Assessment) Mild  Hair Reviewed  Eyes Reviewed  Mouth Reviewed  Skin Reviewed  Nails Reviewed       Diet Order:   Diet Order             Diet Carb Modified Fluid consistency: Thin; Room service appropriate? Yes  Diet effective now                   EDUCATION NEEDS:   Not appropriate for education at this time  Skin:  Skin Assessment: Skin Integrity Issues: Skin Integrity Issues:: Other (Comment) Other: laceration on lt finger 2/2 dog bite  Last BM:  10/26/21  Height:   Ht Readings from Last 1 Encounters:  10/24/21 5\' 6"  (1.676 m)    Weight:   Wt Readings from Last 1 Encounters:  10/26/21 82.6 kg    Ideal Body Weight:  59 kg  BMI:  Body mass index is 29.39 kg/m.  Estimated Nutritional Needs:   Kcal:  2050-2250  Protein:  105-120 grams  Fluid:  1000 ml + UOP    Loistine Chance, RD, LDN, [place] Registered Dietitian II Certified Diabetes Care and Education Specialist Please refer to [facility] for RD and/or RD on-call/weekend/after hours pager

## 2021-10-26 NOTE — Progress Notes (Signed)
PT Cancellation Note  Patient Details Name: Katie [name] MRN: [id number] DOB: 1956-02-15   Cancelled Treatment:     Pt is off floor in HD. Author will return at more appropriate time when pt is available.    Willette Pa 10/26/2021, 11:24 AM

## 2021-10-27 DIAGNOSIS — J189 Pneumonia, unspecified organism: Secondary | ICD-10-CM | POA: Diagnosis not present

## 2021-10-27 DIAGNOSIS — A419 Sepsis, unspecified organism: Secondary | ICD-10-CM | POA: Diagnosis not present

## 2021-10-27 DIAGNOSIS — K5641 Fecal impaction: Secondary | ICD-10-CM

## 2021-10-27 DIAGNOSIS — N186 End stage renal disease: Secondary | ICD-10-CM | POA: Diagnosis not present

## 2021-10-27 LAB — BPAM RBC
Blood Product Expiration Date: 202307132359
Blood Product Expiration Date: 202307152359
ISSUE DATE / TIME: 202306081310
ISSUE DATE / TIME: 202306100811
Unit Type and Rh: 5100
Unit Type and Rh: 5100

## 2021-10-27 LAB — TYPE AND SCREEN
ABO/RH(D): O POS
Antibody Screen: POSITIVE
Unit division: 0
Unit division: 0

## 2021-10-27 LAB — GLUCOSE, CAPILLARY
Glucose-Capillary: 96 mg/dL (ref 70–99)
Glucose-Capillary: 102 mg/dL — ABNORMAL HIGH (ref 70–99)
Glucose-Capillary: 129 mg/dL — ABNORMAL HIGH (ref 70–99)
Glucose-Capillary: 93 mg/dL (ref 70–99)

## 2021-10-27 MED ORDER — POLYETHYLENE GLYCOL 3350 17 G PO PACK: 17.0000 g | PACK | Freq: Two times a day (BID) | ORAL | Status: DC

## 2021-10-27 MED ORDER — POLYETHYLENE GLYCOL 3350 17 G PO PACK
17.0000 g | PACK | Freq: Two times a day (BID) | ORAL | Status: DC
  Administered 2021-10-27: 17 g via ORAL
  Filled 2021-10-27: qty 1

## 2021-10-27 MED ORDER — LACTULOSE 10 GM/15ML PO SOLN
20.0000 g | Freq: Three times a day (TID) | ORAL | Status: DC
  Administered 2021-10-27 (×2): 20 g via ORAL
  Filled 2021-10-27 (×3): qty 30

## 2021-10-27 MED ORDER — PEG 3350-KCL-NA BICARB-NACL 420 G PO SOLR
2000.0000 mL | Freq: Once | ORAL | Status: AC
  Administered 2021-10-27: 2000 mL via ORAL
  Filled 2021-10-27: qty 4000

## 2021-10-27 MED ORDER — LUBIPROSTONE 24 MCG PO CAPS
24.0000 ug | ORAL_CAPSULE | Freq: Two times a day (BID) | ORAL | Status: DC
  Administered 2021-10-27 – 2021-11-15 (×33): 24 ug via ORAL
  Filled 2021-10-27 (×39): qty 1

## 2021-10-27 MED ORDER — PEG 3350-KCL-NA BICARB-NACL 420 G PO SOLR: 4000.0000 mL | Freq: Once | ORAL | Status: DC

## 2021-10-27 NOTE — Progress Notes (Signed)
Patient ID: Katie [name], female   DOB: 03-07-1956, 66 y.o.   MRN: [id number]  Subjective: The patient still notes some soreness in her left hand and forearm, but feels that her range of motion continues to improve.  She denies any new injuries to the hand.  She also denies any numbness or paresthesias to her fingers.   Objective: Vital signs in last 24 hours: Temp:  [98.5 F (36.9 C)-100 F (37.8 C)] 98.5 F (36.9 C) (06/16 0519) Pulse Rate:  [78-94] 80 (06/16 0519) Resp:  [16-27] 16 (06/16 0519) BP: (101-147)/(43-92) 111/57 (06/16 0519) SpO2:  [90 %-92 %] 90 % (06/16 0519) Weight:  [82.6 kg-84.9 kg] 84.2 kg (06/16 0500)  Intake/Output from previous day: 06/15 0701 - 06/16 0700 In: -  Out: 1501  Intake/Output this shift: No intake/output data recorded.  Recent Labs    10/24/21 0956 10/25/21 0437  HGB 8.9* 8.8*   Recent Labs    10/24/21 0956 10/25/21 0437  WBC 18.2* 11.7*  RBC 2.99* 2.92*  HCT 28.4* 27.5*  PLT 238 211   Recent Labs    10/24/21 0956 10/25/21 0437  NA 134* 135  K 4.0 3.3*  CL 97* 100  CO2 25 29  BUN 36* 20  CREATININE 5.56* 3.46*  GLUCOSE 129* 118*  CALCIUM 8.4* 8.3*   No results for input(s): "LABPT", "INR" in the last 72 hours.  Physical Exam: Orthopedic examination is limited to the left hand and forearm.  Skin inspection is unremarkable.  No swelling, erythema, ecchymosis, abrasions, or other skin abnormalities are identified.  The normal skin wrinkling has returned to her wrist and hand.  The areas of previously dried blisters have healed further.  She is able to actively flex and extend all digits without any catching, but still has difficulty fully flexing her fingers as she lacks 1.5 to 2 cm from achieving her fingertips to the proximal palmar crease.  She exhibits full active and passive range of motion of the wrist.  She is neurovascular intact to all digits.  X-rays: An MRI scan of the right hand was obtained on 10/25/2021.  By report,  the study demonstrates no evidence for osteomyelitis or septic arthritis.  In addition, no fluid collections are identified.  There is mild soft tissue swelling over the dorsal aspect of the wrist, possibly consistent with cellulitis.  Both the films and report were reviewed by myself and discussed with the patient.  Assessment: Resolving left hand and forearm cellulitis status post dog bites.  Plan: The treatment options have been reviewed with the patient.  The patient is reassured that no surgical intervention is necessary at this time.  She may continue to progress in her activities as symptoms permit, but is to avoid offending activities.  Given her residual stiffness, she may benefit from occupational therapy to help her with exercises to optimize her range of motion and strength.  Thank you for allowing me to participate in the care of this most delightful woman.  I will sign off at this time.  Please reconsult me if you have further need of orthopedic input during this hospitalization.  She may follow-up in my office on an as necessary basis.   Katie [name] 10/27/2021, 8:01 AM

## 2021-10-27 NOTE — Progress Notes (Addendum)
Tap water enema complete. Tolerated well. Patient to commode after enema. Patient states she feels her belly is less bloated. Yet, No BM at this time. Will monitor.

## 2021-10-27 NOTE — Progress Notes (Addendum)
Progress Note   Patient: Katie [name] GDJ:[id number] DOB: 1955-12-28 DOA: 09/30/2021     4 DOS: the patient was seen and examined on 10/27/2021   Brief hospital course: Katie [name] is a 66 y.o. female with medical history significant of ESRD, DM II, homelessness. Had been admitted 05/13-05/17 for sepsis d/t cellulits from animal bite to arm, discharged on po abx and was unable to fill the Rx, was kicked out of where she was living, no way to get HD.  Patient has been in the ED since arriving the hospital.  She was admitted to the hospital again on 10/23/2021 as patient developed cough short of breath and fever.  Chest x-ray showed pneumonia.  Patient was diagnosed with sepsis and pneumonia, was started on cefepime and vancomycin. Had MRI performed on 6/14 did not show any osteomyelitis. Patient is currently homeless, has a severe weakness.  No discharge option at this time. 6/15.  Patient has no nausea, KUB showed fecal impaction with severe dilated colon, patient was started on senna as well as scheduled lactulose.  CT chest also showed dense right lower lobe consolidation consistent with aspiration pneumonia.  Shoulder x-ray does not show any osteomyelitis versus septic joint.  Assessment and Plan:  Sepsis. Healthcare associate pneumonia. Recent dog bites with cellulitis. At this point, patient does not have hand osteomyelitis or septic joint, cellulitis has improved. Patient also has some left shoulder pain, CT scan did not show any evidence of infection. Blood cultures so far has not been negative. Chest CT scan performed on 6/15 showed significant dense right lower lobe consolidation, most likely aspiration pneumonia. Patient has been dilated by speech therapist, does not seem to have any pharyngeal phase of dysphagia. Discussed with infect disease to adjust antibiotics.  Fecal impaction. Patient had some nausea yesterday, mild abdominal distention.  A KUB was performed, showed  significant fecal impaction, with chronic dilation up to 10 cm. However, patient does not seem to have any abdominal pain.  She appears to have a chronic constipation accumulating to a severe fecal impaction. Patient was started on scheduled lactulose and senna yesterday.  She only had a small bowel movement last night, will add scheduled MiraLAX.  Also added Amitiza as patient may have irritable bowel syndrome.  Additionally, patient is giving Dulcolax suppository daily.  End stage renal disease on dialysis. Anemia of chronic kidney disease. Hypokalemia. Followed by nephrology.  Type 2 diabetes. Continue current regimen.  Essential hypertension. Continue to follow   Addendum 1650.  Still no bowel movement. Consulted Dr. Alice Reichert, discussed with Dr. Candiss Norse. OK to give water enema and nulytely. Dr. Alice Reichert will see pt tomorrow.     Subjective:  Patient had some nausea yesterday, but does not have significant abdominal distention.  She appears to have severe constipation. No short of breath or cough  Physical Exam: Vitals:   10/26/21 2050 10/27/21 0500 10/27/21 0519 10/27/21 0812  BP: 117/64  (!) 111/57 117/64  Pulse: 81  80 73  Resp: 18  16 18   Temp: 99.1 F (37.3 C)  98.5 F (36.9 C) 98.7 F (37.1 C)  TempSrc: Oral  Oral Oral  SpO2: 90%  90% 90%  Weight:  84.2 kg    Height:       General exam: Appears calm and comfortable  Respiratory system: Clear to auscultation. Respiratory effort normal. Cardiovascular system: S1 & S2 heard, RRR. No JVD, murmurs, rubs, gallops or clicks. No pedal edema. Gastrointestinal system: Abdomen is distended, soft and nontender. No  organomegaly or masses felt. Normal bowel sounds heard. Central nervous system: Alert and oriented. No focal neurological deficits. Extremities: Symmetric 5 x 5 power. Skin: No rashes, lesions or ulcers Psychiatry: Judgement and insight appear normal. Mood & affect appropriate.   Data Reviewed:  Reviewed CT  chest, CT shoulder, abdominal film.  All lab results.  Family Communication:   Disposition: Status is: Inpatient Remains inpatient appropriate because: Severity of disease,  Planned Discharge Destination:  pending    Time spent: 55 minutes  Author: Sharen Hones, MD 10/27/2021 12:54 PM  For on call review [email or web address].

## 2021-10-27 NOTE — Progress Notes (Signed)
[facility]  Dialysis Note   Subjective:   Patient seen resting in bed Alert and oriented Complains of constipation States she is tired of hurting  Objective:  Vital signs in last 24 hours:  Temp:  [98.5 F (36.9 C)-100 F (37.8 C)] 98.7 F (37.1 C) (06/16 0812) Pulse Rate:  [73-94] 73 (06/16 0812) Resp:  [16-24] 18 (06/16 0812) BP: (101-147)/(43-92) 117/64 (06/16 0812) SpO2:  [90 %-92 %] 90 % (06/16 0812) Weight:  [82.6 kg-84.2 kg] 84.2 kg (06/16 0500)  Weight change: 0.7 kg Filed Weights   10/26/21 0904 10/26/21 1238 10/27/21 0500  Weight: 84.9 kg 82.6 kg 84.2 kg    Intake/Output: I/O last 3 completed shifts: In: -  Out: 1501 [Other:1501]   Intake/Output this shift:  No intake/output data recorded.  Physical Exam: General: NAD  Head: Normocephalic, atraumatic. Moist oral mucosal membranes  Eyes: Anicteric  Lungs:  Clear to auscultation, normal effort, congested cough  Heart: Regular rate and rhythm  Abdomen:  Soft, nontender  Extremities:  1+ peripheral edema.  Neurologic: Nonfocal, moving all four extremities  Skin: No lesions, left hand ring finger edema  Access: Right PermCath    Basic Metabolic Panel: Recent Labs  Lab 10/23/21 1605 10/23/21 2338 10/24/21 0956 10/25/21 0437  NA 133*  --  134* 135  K 4.2  --  4.0 3.3*  CL 98  --  97* 100  CO2 30  --  25 29  GLUCOSE 103*  --  129* 118*  BUN 31*  --  36* 20  CREATININE 5.17* 5.41* 5.56* 3.46*  CALCIUM 8.6*  --  8.4* 8.3*  PHOS  --   --  3.0  --      Liver Function Tests: Recent Labs  Lab 10/23/21 1605 10/24/21 0956  AST 15 15  ALT 14 13  ALKPHOS 101 96  BILITOT 0.7 0.8  PROT 6.7 6.3*  ALBUMIN 2.7* 2.5*    No results for input(s): "LIPASE", "AMYLASE" in the last 168 hours. No results for input(s): "AMMONIA" in the last 168 hours.  CBC: Recent Labs  Lab 10/23/21 1605 10/24/21 0956 10/25/21 0437  WBC 22.9* 18.2* 11.7*  NEUTROABS 19.6*  --   --   HGB 9.6* 8.9*  8.8*  HCT 31.3* 28.4* 27.5*  MCV 96.0 95.0 94.2  PLT 250 238 211     Cardiac Enzymes: No results for input(s): "CKTOTAL", "CKMB", "CKMBINDEX", "TROPONINI" in the last 168 hours.  BNP: Invalid input(s): "POCBNP"  CBG: Recent Labs  Lab 10/26/21 0723 10/26/21 1329 10/26/21 1623 10/26/21 2045 10/27/21 0810  GLUCAP 93 112* 139* 127* 96     Microbiology: Results for orders placed or performed during the hospital encounter of 09/30/21  Resp Panel by RT-PCR (Flu A&B, Covid) Nasopharyngeal Swab     Status: None   Collection Time: 09/30/21  9:34 AM   Specimen: Nasopharyngeal Swab; Nasopharyngeal(NP) swabs in vial transport medium  Result Value Ref Range Status   SARS Coronavirus 2 by RT PCR NEGATIVE NEGATIVE Final    Comment: (NOTE) SARS-CoV-2 target nucleic acids are NOT DETECTED.  The SARS-CoV-2 RNA is generally detectable in upper respiratory specimens during the acute phase of infection. The lowest concentration of SARS-CoV-2 viral copies this assay can detect is 138 copies/mL. A negative result does not preclude SARS-Cov-2 infection and should not be used as the sole basis for treatment or other patient management decisions. A negative result may occur with  improper specimen collection/handling, submission of specimen other than  nasopharyngeal swab, presence of viral mutation(s) within the areas targeted by this assay, and inadequate number of viral copies(<138 copies/mL). A negative result must be combined with clinical observations, patient history, and epidemiological information. The expected result is Negative.  Fact Sheet for Patients:  EntrepreneurPulse.com.au  Fact Sheet for Healthcare Providers:  IncredibleEmployment.be  This test is no t yet approved or cleared by the [place] FDA and  has been authorized for detection and/or diagnosis of SARS-CoV-2 by FDA under an Emergency Use Authorization (EUA). This EUA will  remain  in effect (meaning this test can be used) for the duration of the COVID-19 declaration under Section 564(b)(1) of the Act, 21 U.S.C.section 360bbb-3(b)(1), unless the authorization is terminated  or revoked sooner.       Influenza A by PCR NEGATIVE NEGATIVE Final   Influenza B by PCR NEGATIVE NEGATIVE Final    Comment: (NOTE) The Xpert Xpress SARS-CoV-2/FLU/RSV plus assay is intended as an aid in the diagnosis of influenza from Nasopharyngeal swab specimens and should not be used as a sole basis for treatment. Nasal washings and aspirates are unacceptable for Xpert Xpress SARS-CoV-2/FLU/RSV testing.  Fact Sheet for Patients: EntrepreneurPulse.com.au  Fact Sheet for Healthcare Providers: IncredibleEmployment.be  This test is not yet approved or cleared by the [place] FDA and has been authorized for detection and/or diagnosis of SARS-CoV-2 by FDA under an Emergency Use Authorization (EUA). This EUA will remain in effect (meaning this test can be used) for the duration of the COVID-19 declaration under Section 564(b)(1) of the Act, 21 U.S.C. section 360bbb-3(b)(1), unless the authorization is terminated or revoked.  Performed at [facility], [address] [place], [place] [address]   SARS Coronavirus 2 by RT PCR (hospital order, performed in [facility] hospital lab) *cepheid single result test* Anterior Nasal Swab     Status: None   Collection Time: 10/23/21  4:06 PM   Specimen: Anterior Nasal Swab  Result Value Ref Range Status   SARS Coronavirus 2 by RT PCR NEGATIVE NEGATIVE Final    Comment: (NOTE) SARS-CoV-2 target nucleic acids are NOT DETECTED.  The SARS-CoV-2 RNA is generally detectable in upper and lower respiratory specimens during the acute phase of infection. The lowest concentration of SARS-CoV-2 viral copies this assay can detect is 250 copies / mL. A negative result does not preclude SARS-CoV-2  infection and should not be used as the sole basis for treatment or other patient management decisions.  A negative result may occur with improper specimen collection / handling, submission of specimen other than nasopharyngeal swab, presence of viral mutation(s) within the areas targeted by this assay, and inadequate number of viral copies (<250 copies / mL). A negative result must be combined with clinical observations, patient history, and epidemiological information.  Fact Sheet for Patients:   https://www.patel.info/  Fact Sheet for Healthcare Providers: https://hall.com/  This test is not yet approved or  cleared by the [place] FDA and has been authorized for detection and/or diagnosis of SARS-CoV-2 by FDA under an Emergency Use Authorization (EUA).  This EUA will remain in effect (meaning this test can be used) for the duration of the COVID-19 declaration under Section 564(b)(1) of the Act, 21 U.S.C. section 360bbb-3(b)(1), unless the authorization is terminated or revoked sooner.  Performed at [facility], [address] [place], [place] [address]   Blood culture (routine x 2)     Status: None (Preliminary result)   Collection Time: 10/23/21  4:06 PM  Specimen: BLOOD  Result Value Ref Range Status   Specimen Description BLOOD BLOOD RIGHT HAND  Final   Special Requests   Final    BOTTLES DRAWN AEROBIC AND ANAEROBIC Blood Culture adequate volume   Culture   Final    NO GROWTH 4 DAYS Performed at [facility], [address] [place], [place] [address]    Report Status PENDING  Incomplete  Culture, blood (Routine X 2) w Reflex to ID Panel     Status: None (Preliminary result)   Collection Time: 10/24/21 12:06 AM   Specimen: BLOOD  Result Value Ref Range Status   Specimen Description BLOOD LEFT HAND  Final   Special Requests   Final    BOTTLES DRAWN AEROBIC AND ANAEROBIC Blood Culture adequate  volume   Culture   Final    NO GROWTH 3 DAYS Performed at [facility], [address], [place], [place] [address]    Report Status PENDING  Incomplete  MRSA Next Gen by PCR, Nasal     Status: None   Collection Time: 10/24/21  2:24 PM   Specimen: Nasal Mucosa; Nasal Swab  Result Value Ref Range Status   MRSA by PCR Next Gen NOT DETECTED NOT DETECTED Final    Comment: (NOTE) The GeneXpert MRSA Assay (FDA approved for NASAL specimens only), is one component of a comprehensive MRSA colonization surveillance program. It is not intended to diagnose MRSA infection nor to guide or monitor treatment for MRSA infections. Test performance is not FDA approved in patients less than 58 years old. Performed at [facility], [place], [place] [address]   Respiratory (~20 pathogens) panel by PCR     Status: None   Collection Time: 10/24/21  2:24 PM   Specimen: Nasopharyngeal Swab; Respiratory  Result Value Ref Range Status   Adenovirus NOT DETECTED NOT DETECTED Final   Coronavirus 229E NOT DETECTED NOT DETECTED Final    Comment: (NOTE) The Coronavirus on the Respiratory Panel, DOES NOT test for the novel  Coronavirus (2019 nCoV)    Coronavirus HKU1 NOT DETECTED NOT DETECTED Final   Coronavirus NL63 NOT DETECTED NOT DETECTED Final   Coronavirus OC43 NOT DETECTED NOT DETECTED Final   Metapneumovirus NOT DETECTED NOT DETECTED Final   Rhinovirus / Enterovirus NOT DETECTED NOT DETECTED Final   Influenza A NOT DETECTED NOT DETECTED Final   Influenza B NOT DETECTED NOT DETECTED Final   Parainfluenza Virus 1 NOT DETECTED NOT DETECTED Final   Parainfluenza Virus 2 NOT DETECTED NOT DETECTED Final   Parainfluenza Virus 3 NOT DETECTED NOT DETECTED Final   Parainfluenza Virus 4 NOT DETECTED NOT DETECTED Final   Respiratory Syncytial Virus NOT DETECTED NOT DETECTED Final   Bordetella pertussis NOT DETECTED NOT DETECTED Final   Bordetella Parapertussis NOT DETECTED NOT  DETECTED Final   Chlamydophila pneumoniae NOT DETECTED NOT DETECTED Final   Mycoplasma pneumoniae NOT DETECTED NOT DETECTED Final    Comment: Performed at [facility], [place]. [address], [place], [place] [address]    Coagulation Studies: No results for input(s): "LABPROT", "INR" in the last 72 hours.  Urinalysis: No results for input(s): "COLORURINE", "LABSPEC", "PHURINE", "GLUCOSEU", "HGBUR", "BILIRUBINUR", "KETONESUR", "PROTEINUR", "UROBILINOGEN", "NITRITE", "LEUKOCYTESUR" in the last 72 hours.  Invalid input(s): "APPERANCEUR"    Imaging: CT SHOULDER LEFT WO CONTRAST  Result Date: 10/26/2021 CLINICAL DATA:  Clinical suspicion for septic arthritis involving the left shoulder joint. EXAM: CT OF THE UPPER LEFT EXTREMITY WITHOUT CONTRAST TECHNIQUE: Multidetector  CT imaging of the upper left extremity was performed according to the standard protocol. RADIATION DOSE REDUCTION: This exam was performed according to the departmental dose-optimization program which includes automated exposure control, adjustment of the mA and/or kV according to patient size and/or use of iterative reconstruction technique. COMPARISON:  None Available. FINDINGS: The glenohumeral joint is maintained. I do not see any destructive bony changes to suggest septic arthritis. No obvious joint effusion or inflammatory changes involving the shoulder musculature. There is some subcutaneous inflammatory change along the posterior deltoid muscle without discrete fluid collection or hematoma. The [facility] joint is intact. Mild degenerative changes. The acromion is flat or type 1 in shape. There is undersurface spurring change. Grossly by CT the rotator cuff tendons are intact. There is calcific tendinopathy the involving the supraspinatus attachment site. The visualized left ribs are intact and the visualized left lung is grossly clear. IMPRESSION: 1. No CT findings to suggest septic arthritis or osteomyelitis. 2. Mild subcutaneous  inflammatory change along the posterior deltoid muscle without discrete fluid collection or hematoma. 3. Grossly by CT the rotator cuff tendons are intact. There is calcific tendinopathy involving the supraspinatus attachment site. Electronically Signed   By: Marijo Sanes M.D.   On: 10/26/2021 20:25   CT CHEST WO CONTRAST  Result Date: 10/26/2021 CLINICAL DATA:  Abnormal xray - lung nodule, >= 1 cm EXAM: CT CHEST WITHOUT CONTRAST TECHNIQUE: Multidetector CT imaging of the chest was performed following the standard protocol without IV contrast. RADIATION DOSE REDUCTION: This exam was performed according to the departmental dose-optimization program which includes automated exposure control, adjustment of the mA and/or kV according to patient size and/or use of iterative reconstruction technique. COMPARISON:  12/12/2020 FINDINGS: Cardiovascular: Extensive multi-vessel coronary artery calcification. Calcification of the aortic valve leaflets noted. Moderate calcification of the mitral valve annulus. Global cardiac size within normal limits. No pericardial effusion. Central pulmonary arteries are of normal caliber. Mild atherosclerotic calcification within the thoracic aorta. No aortic aneurysm. Right internal jugular hemodialysis catheter tip noted within the superior right atrium. Mediastinum/Nodes: Multiple subcentimeter nodules and calcifications are seen within the thyroid gland, unlikely of clinical significance. No further follow-up is recommended for these lesions. There is shotty mediastinal adenopathy, possibly reactive in nature. No frankly pathologic thoracic adenopathy. The esophagus is unremarkable. Lungs/Pleura: There is dense right lower lobe consolidation, possibly related to acute infection or aspiration. Small right parapneumonic effusion. There is left lower lobe volume loss and traction bronchiectasis, likely the sequela of remote infection or inflammation. Small left pleural effusion is  present. No pneumothorax. Central airways are widely patent. Upper Abdomen: Cholelithiasis without pericholecystic inflammatory change noted. No acute abnormality. Musculoskeletal: No acute bone abnormality. No lytic or blastic bone lesion. IMPRESSION: 1. Dense right lower lobe consolidation, possibly related to acute infection or aspiration. Small associated right parapneumonic effusion. 2. Small left pleural effusion. 3. Extensive multi-vessel coronary artery calcification. 4. Cholelithiasis. Electronically Signed   By: Fidela Salisbury M.D.   On: 10/26/2021 19:43   DG Abd 1 View  Result Date: 10/26/2021 CLINICAL DATA:  Abdominal pain with history of CKD. EXAM: ABDOMEN - 1 VIEW COMPARISON:  March 18, 2021. FINDINGS: Moderate gastric distension. Large volume stool throughout the colon. Large volume stool in the rectum. 10 cm distension of the cecum. On limited assessment no acute skeletal findings. Edema in the fat of the LEFT and RIGHT flank. Findings of dense consolidative changes in the LEFT lung base. IMPRESSION: 1. Large volume stool throughout the colon and  rectum. Correlate with signs of constipation with rectal fecal impaction. Note that the cecum is dilated up to 10 cm, beyond this level of distension there is risk for perforation. 2. Moderate gastric distension without small bowel dilation visible on today's study. 3. Findings of dense consolidative changes in the LEFT lung base, correlate with any signs of pneumonia though there was basilar collapse on the study of Sep 23, 2021 which persists and is been present as far back as July of 2022. Consider follow-up dedicated chest CT to exclude endobronchial lesion as a cause for chronic lobar collapse. These results will be called to the ordering clinician or representative by the Radiologist Assistant, and communication documented in the PACS or [employer]. Electronically Signed   By: Zetta Bills M.D.   On: 10/26/2021 16:11   MR HAND LEFT W WO  CONTRAST  Result Date: 10/26/2021 CLINICAL DATA:  Dog bite.  Concern for osteomyelitis EXAM: MRI OF THE LEFT HAND WITHOUT AND WITH CONTRAST TECHNIQUE: Multiplanar, multisequence MR imaging of the left hand was performed before and after the administration of intravenous contrast. CONTRAST:  24mL GADAVIST GADOBUTROL 1 MMOL/ML IV SOLN COMPARISON:  CT 10/23/2021, x-ray 09/30/2021 FINDINGS: Technical Note: Despite efforts by the technologist and patient, motion artifact is present on today's exam and could not be eliminated. This reduces exam sensitivity and specificity. Bones/Joint/Cartilage No acute fracture. No dislocation. No focal site of bone destruction or erosion. No bone marrow edema or marrow replacement. Mild degenerative changes within the hand and wrist. No joint effusion or evidence of synovitis. Ligaments Collateral ligaments of the hand are intact. Muscles and Tendons Grossly intact flexor and extensor tendons without evidence of tendon tear or tenosynovitis. No intramuscular fluid collections. Soft tissues Dorsal subcutaneous edema along the ulnar aspect of the hand and wrist. No organized or rim enhancing fluid collections. IMPRESSION: 1. Limited exam. 2. Dorsal subcutaneous edema along the ulnar aspect of the hand and wrist suggesting cellulitis. No organized or rim enhancing fluid collections. 3. No acute osseous findings. Specifically, no evidence of osteomyelitis within the limitations of this exam. Electronically Signed   By: Davina Poke D.O.   On: 10/26/2021 08:24     Medications:    ceFEPime (MAXIPIME) IV Stopped (10/24/21 1440)   ondansetron (ZOFRAN) IV 8 mg (10/26/21 1615)   promethazine (PHENERGAN) injection (IM or IVPB)     vancomycin Stopped (10/26/21 1336)    amLODipine  10 mg Oral QPM   bisacodyl  10 mg Rectal Daily   calcium acetate  667 mg Oral TID WC   carvedilol  12.5 mg Oral QPM   Chlorhexidine Gluconate Cloth  6 each Topical Q0600   epoetin (EPOGEN/PROCRIT)  injection  4,000 Units Intravenous Q T,Th,Sa-HD   feeding supplement (NEPRO CARB STEADY)  237 mL Oral TID BM   heparin  5,000 Units Subcutaneous Q12H   insulin aspart  0-15 Units Subcutaneous TID WC   lactulose  20 g Oral TID   lubiprostone  24 mcg Oral BID WC   metoCLOPramide (REGLAN) injection  10 mg Intravenous Once   multivitamin  1 tablet Oral QHS   pantoprazole  40 mg Oral BID   polyethylene glycol  17 g Oral BID   scopolamine  1 patch Transdermal Q72H   senna-docusate  2 tablet Oral BID   sodium chloride flush  3 mL Intravenous Q12H   acetaminophen **OR** acetaminophen, guaiFENesin, HYDROcodone-acetaminophen, morphine injection, ondansetron (ZOFRAN) IV, oxyCODONE-acetaminophen, tiZANidine, topiramate  Assessment/ Plan:  Ms. Lindalou Soltis  is a 66 y.o.  female with past medical history of ESRD with hemodialysis TTS, diabetes mellitus, hypertension to the hospital seeking long term placement. She was recently admitted and received antibiotics for a dog bite.  She was discharged with a prescription for home antibiotics.  She states her caregivers were unable to retrieve antibiotics.  She states that there was an argument and that she is here seeking long-term placement.  Principal Problem:   Sepsis ([place]) Active Problems:   Hypertension   Type 2 diabetes mellitus ([place])   ESRD (end stage renal disease) ([place])   Financial difficulty   Anemia in chronic kidney disease   Cellulitis   Bent bone fracture of ulna   Hospital acquired PNA   [facility]/TTS/right PermCath  End Stage Renal Disease on hemodialysis:   Received treatment yesterday, UF 1.5L as tolerated. Next treatment scheduled for Saturday. Informed patient that we will provide dialysis treatments with her seated in chair for the remainder of her treatments. She was informed she would have support for the transfer and we want to prevent any further deconditioning. Notified HD staff and floor staff. Requested floor  staff sit patient in chair in room to facilitate this.   No results found for: "POTASSIUM"  Intake/Output Summary (Last 24 hours) at 10/27/2021 1042 Last data filed at 10/26/2021 1220 Gross per 24 hour  Intake --  Output 1501 ml  Net -1501 ml     2. Hypertension with chronic kidney disease:   Currently receiving amlodipine and carvedilol.   BP 117/64   BP 117/64 (BP Location: Right Arm)   Pulse 73   Temp 98.7 F (37.1 C) (Oral)   Resp 18   Ht 5\' 6"  (1.676 m)   Wt 84.2 kg   SpO2 90%   BMI 29.96 kg/m   3. Anemia of chronic kidney disease/ kidney injury/chronic disease/acute blood loss:   Lab Results  Component Value Date   HGB 8.8 (L) 10/25/2021   Continue low-dose EPO with dialysis treatments.  4. Secondary Hyperparathyroidism:     Lab Results  Component Value Date   PTH 70 (H) 05/12/2020   CALCIUM 8.3 (L) 10/25/2021   PHOS 3.0 10/24/2021   We will continue to monitor bone minerals during this admission.  Continue calcium acetate with meals    LOS: [facility] 6/16/202310:42 AM

## 2021-10-27 NOTE — Consult Note (Addendum)
Pharmacy Antibiotic Note  Katie [name] is a 66 y.o. female admitted on 09/30/2021 with a dog bite. Pt now with a fever of 100.4, WBC elevated to 22.9 and concerns for pneumonia. Pharmacy has been consulted for cefepime and vancomycin dosing. -ID following  Plan: -Vancomycin 1000 mg IV daily on Tues/Thurs/Sat with hemodialysis  -Cefepime 2 g IV daily on Tues/Thurs/Sat with hemodialysis    PER ID note 6/15 plan discontinue abx on 6/19. -therefore will not order Vanc level at this time -Last doses will actually be 6/17 w/ HD   Height: 5\' 6"  (167.6 cm) Weight: 84.2 kg (185 lb 10 oz) IBW/kg (Calculated) : 59.3  Temp (24hrs), Avg:99 F (37.2 C), Min:98.5 F (36.9 C), Max:100 F (37.8 C)  Recent Labs  Lab 10/23/21 1605 10/23/21 2338 10/24/21 0956 10/25/21 0437  WBC 22.9*  --  18.2* 11.7*  CREATININE 5.17* 5.41* 5.56* 3.46*  LATICACIDVEN 0.8 1.3  --   --      Estimated Creatinine Clearance: 18.2 mL/min (A) (by C-G formula based on SCr of 3.46 mg/dL (H)).    Allergies  Allergen Reactions   Penicillins Nausea And Vomiting    Pt states "only pills" make her vomit Other reaction(s): Nausea/Vomit    Antimicrobials this admission: Cefepime 6/12 >>  Vancomycin 6/12 >>   Dose adjustments this admission: N/A  Microbiology results: 6/12 BCx: [id number] 6/12 MRSA PCR: neg  Thank you for allowing pharmacy to be a part of this patient's care.  Radford Pease A, PharmD 10/27/2021 11:21 AM

## 2021-10-27 NOTE — Progress Notes (Signed)
Continuing to follow for outpatient dialysis needs and placement.

## 2021-10-27 NOTE — Progress Notes (Signed)
OT Cancellation Note  Patient Details Name: Katie [name] MRN: [id number] DOB: 10/18/1955   Cancelled Treatment:    Reason Eval/Treat Not Completed: Other (comment). Upon attempt, pt with nurse tech for care. Declines OT. Will re-attempt at later date/time as pt is available and medically appropriate.   Ardeth Perfect., MPH, MS, OTR/L ascom [phone] 10/27/21, 1:39 PM

## 2021-10-27 NOTE — Progress Notes (Signed)
Physical Therapy Treatment Patient Details Name: Katie [name] MRN: [id number] DOB: 03-12-1956 Today's Date: 10/27/2021   History of Present Illness Pt is a 66 y.o. female presenting to hospital 5/20 with concerns for worsening L hand pain d/t animal bite (pt recently admitted 5/13-5/17 for same reason)--pt not taking antibiotics for known dog bite.  Pt's friend dropped her off at dialysis and refused to do any more (pt now homeless).  PMH includes ESRD on HD TTS, DM, htn, orthostatic hypotension, and h/o mandible surgery.    PT Comments    Pt was supine in bed with HOB elevated ~ 20 degrees. She was on 2 L o2 with sao2 92%. Removed O2 and pt quickly dropped sao2 into low 70s. Reapplied 2 L O2 however pt required 4 L O2 to maintain > 88% during standing/gait activity. Pt is severely limited by fatigue this session. Only able to ambulate short distance prior to requesting seated rest. Pt has extensive Hx of orthostatic hypotension. BP was stable through this session with lowest reading after gait training 108/66. Pt is high fall risk. Will greatly benefit from SNF at DC to address deficits while maximizing independence with ADLs.    Recommendations for follow up therapy are one component of a multi-disciplinary discharge planning process, led by the attending physician.  Recommendations may be updated based on patient status, additional functional criteria and insurance authorization.  Follow Up Recommendations  Skilled nursing-short term rehab (<3 hours/day)     Assistance Recommended at Discharge Frequent or constant Supervision/Assistance  Patient can return home with the following A little help with walking and/or transfers;A little help with bathing/dressing/bathroom;Assistance with cooking/housework;Assistance with feeding;Direct supervision/assist for medications management;Direct supervision/assist for financial management;Assist for transportation;Help with stairs or ramp for entrance    Equipment Recommendations  Other (comment) (ongoing assessment)       Precautions / Restrictions Precautions Precautions: Fall Restrictions Weight Bearing Restrictions: No     Mobility  Bed Mobility Overal bed mobility: Modified Independent Bed Mobility: Supine to Sit, Sit to Supine     Supine to sit: Modified independent (Device/Increase time), HOB elevated Sit to supine: Modified independent (Device/Increase time), HOB elevated        Transfers Overall transfer level: Needs assistance Equipment used: Rolling walker (2 wheels) Transfers: Sit to/from Stand Sit to Stand: Min guard, Min assist (CGA for elevated surface heights and min assist from lower. once fatigued she required mod assist to stand from w/c surface)           General transfer comment: pt required more assistance today due to increased weakness. " I just feel so weak today."    Ambulation/Gait Ambulation/Gait assistance: Min guard Gait Distance (Feet): 45 Feet Assistive device: Rolling walker (2 wheels) Gait Pattern/deviations: Step-through pattern Gait velocity: decreased     General Gait Details: pt was able to ambulatre 2 x 45 ft however is severely limited by fatigue. HR was stable but sao2 drops to low 80s with 2 L o2 donned. Increased o2 to 4 L with gait training.maintained > 88% on 4 L. w/c follow for safety due to pt's extensive history of orthostatic hypotension   Balance Overall balance assessment: History of Falls, Needs assistance Sitting-balance support: No upper extremity supported, Feet supported Sitting balance-Leahy Scale: Good     Standing balance support: Bilateral upper extremity supported, During functional activity, Reliant on assistive device for balance Standing balance-Leahy Scale: Fair Standing balance comment: slightly unsteady at times even with use of RW  Cognition Arousal/Alertness: Awake/alert Behavior During Therapy: WFL for tasks  assessed/performed Overall Cognitive Status: Within Functional Limits for tasks assessed          General Comments General comments (skin integrity, edema, etc.): Reviewed importance of  increased activity throughout the date to promote return in strength      Pertinent Vitals/Pain Pain Assessment Pain Assessment: 0-10 Pain Score: 3  Faces Pain Scale: Hurts a little bit Pain Location: back Pain Descriptors / Indicators: Grimacing, Aching Pain Intervention(s): Limited activity within patient's tolerance, Monitored during session, Premedicated before session, Repositioned     PT Goals (current goals can now be found in the care plan section) Acute Rehab PT Goals Patient Stated Goal: find a new place to live Progress towards PT goals: Progressing toward goals    Frequency    Min 2X/week      PT Plan Current plan remains appropriate       AM-PAC PT "6 Clicks" Mobility   Outcome Measure  Help needed turning from your back to your side while in a flat bed without using bedrails?: None Help needed moving from lying on your back to sitting on the side of a flat bed without using bedrails?: A Little Help needed moving to and from a bed to a chair (including a wheelchair)?: A Little Help needed standing up from a chair using your arms (e.g., wheelchair or bedside chair)?: A Little Help needed to walk in hospital room?: A Little Help needed climbing 3-5 steps with a railing? : A Lot 6 Click Score: 18    End of Session Equipment Utilized During Treatment: Oxygen (desaturates to 74% without O2 on. Dropped to low 80s with 2 L during activity but recovers with seated rest.) Activity Tolerance: Patient limited by fatigue Patient left: in bed;with call bell/phone within reach;with bed alarm set Nurse Communication: Mobility status PT Visit Diagnosis: Other abnormalities of gait and mobility (R26.89);Unsteadiness on feet (R26.81);Muscle weakness (generalized) (M62.81);History of  falling (Z91.81);Pain Pain - Right/Left: Left Pain - part of body: Shoulder;Hand;Arm     Time: 9774-1423 PT Time Calculation (min) (ACUTE ONLY): 24 min  Charges:  $Gait Training: 8-22 mins $Therapeutic Activity: 8-22 mins                     Julaine Fusi PTA 10/27/21, 10:14 AM

## 2021-10-28 DIAGNOSIS — A419 Sepsis, unspecified organism: Secondary | ICD-10-CM | POA: Diagnosis not present

## 2021-10-28 DIAGNOSIS — K5641 Fecal impaction: Secondary | ICD-10-CM | POA: Diagnosis not present

## 2021-10-28 DIAGNOSIS — N186 End stage renal disease: Secondary | ICD-10-CM | POA: Diagnosis not present

## 2021-10-28 LAB — COMPREHENSIVE METABOLIC PANEL
ALT: 22 U/L (ref 0–44)
AST: 23 U/L (ref 15–41)
Albumin: 2.3 g/dL — ABNORMAL LOW (ref 3.5–5.0)
Alkaline Phosphatase: 102 U/L (ref 38–126)
Anion gap: 9 (ref 5–15)
BUN: 23 mg/dL (ref 8–23)
CO2: 28 mmol/L (ref 22–32)
Calcium: 8.6 mg/dL — ABNORMAL LOW (ref 8.9–10.3)
Chloride: 99 mmol/L (ref 98–111)
Creatinine, Ser: 4.34 mg/dL — ABNORMAL HIGH (ref 0.44–1.00)
GFR, Estimated: 11 mL/min — ABNORMAL LOW (ref >60)
Glucose, Bld: 90 mg/dL (ref 70–99)
Potassium: 3.5 mmol/L (ref 3.5–5.1)
Sodium: 136 mmol/L (ref 135–145)
Total Bilirubin: 0.7 mg/dL (ref 0.3–1.2)
Total Protein: 6.1 g/dL — ABNORMAL LOW (ref 6.5–8.1)

## 2021-10-28 LAB — CULTURE, BLOOD (ROUTINE X 2)
Culture: NO GROWTH
Special Requests: ADEQUATE

## 2021-10-28 LAB — GLUCOSE, CAPILLARY
Glucose-Capillary: 93 mg/dL (ref 70–99)
Glucose-Capillary: 113 mg/dL — ABNORMAL HIGH (ref 70–99)
Glucose-Capillary: 93 mg/dL (ref 70–99)
Glucose-Capillary: 102 mg/dL — ABNORMAL HIGH (ref 70–99)

## 2021-10-28 MED ORDER — SORBITOL 70 % SOLN
960.0000 mL | TOPICAL_OIL | Freq: Once | ORAL | Status: DC
  Filled 2021-10-28: qty 473

## 2021-10-28 MED ORDER — HEPARIN SODIUM (PORCINE) 1000 UNIT/ML IJ SOLN
INTRAMUSCULAR | Status: AC
  Filled 2021-10-28: qty 10

## 2021-10-28 MED ORDER — ONDANSETRON HCL 4 MG/2ML IJ SOLN
INTRAMUSCULAR | Status: AC
  Filled 2021-10-28: qty 2

## 2021-10-28 MED ORDER — LACTULOSE 10 GM/15ML PO SOLN
20.0000 g | Freq: Two times a day (BID) | ORAL | Status: DC
  Administered 2021-10-30 – 2021-11-14 (×17): 20 g via ORAL
  Filled 2021-10-28 (×22): qty 30

## 2021-10-28 MED ORDER — EPOETIN ALFA 4000 UNIT/ML IJ SOLN
INTRAMUSCULAR | Status: AC
  Filled 2021-10-28: qty 1

## 2021-10-28 MED ORDER — ONDANSETRON HCL 4 MG/2ML IJ SOLN
4.0000 mg | Freq: Four times a day (QID) | INTRAMUSCULAR | Status: DC | PRN
Start: 2021-10-28 — End: 2021-11-18
  Administered 2021-10-28 – 2021-11-15 (×20): 4 mg via INTRAVENOUS
  Filled 2021-10-28 (×18): qty 2

## 2021-10-28 MED ORDER — HEPARIN SODIUM (PORCINE) 1000 UNIT/ML IJ SOLN
2000.0000 [IU] | Freq: Once | INTRAMUSCULAR | Status: AC
  Administered 2021-10-28: 2000 [IU] via INTRAVENOUS

## 2021-10-28 NOTE — Progress Notes (Signed)
  Progress Note   Patient: Katie [name] FEO:[id number] DOB: 1955-11-21 DOA: 09/30/2021     5 DOS: the patient was seen and examined on 10/28/2021   Brief hospital course: Atisha Hamidi is a 66 y.o. female with medical history significant of ESRD, DM II, homelessness. Had been admitted 05/13-05/17 for sepsis d/t cellulits from animal bite to arm, discharged on po abx and was unable to fill the Rx, was kicked out of where she was living, no way to get HD.  Patient has been in the ED since arriving the hospital.  She was admitted to the hospital again on 10/23/2021 as patient developed cough short of breath and fever.  Chest x-ray showed pneumonia.  Patient was diagnosed with sepsis and pneumonia, was started on cefepime and vancomycin. Had MRI performed on 6/14 did not show any osteomyelitis. Patient is currently homeless, has a severe weakness.  No discharge option at this time. 6/15.  Patient has no nausea, KUB showed fecal impaction with severe dilated colon, patient was started on senna as well as scheduled lactulose.  CT chest also showed dense right lower lobe consolidation consistent with aspiration pneumonia.  Shoulder x-ray does not show any osteomyelitis versus septic joint.  Assessment and Plan: Sepsis. Healthcare associate pneumonia. Recent dog bites with cellulitis. Sepsis appears to be secondary to aspiration pneumonia, discussed with ID, will continue antibiotics for 5 days, discontinue vancomycin.   Fecal impaction. Patient was given Nulytely and water enema yesterday per recommendation from GI, still has not a bowel movement.  GI will formally see patient today.  Also discussed with the nurse, patient need set up for at least 6 hours a day.  End stage renal disease on dialysis. Anemia of chronic kidney disease. Hypokalemia. Followed by nephrology.  Type 2 diabetes. Continue current regimen.  Essential hypertension. Continue to follow      Subjective:  Patient still had  no bowel movement after giving Nulytely, currently on liquid diet.  But patient does not have any nausea vomiting or abdominal pain.  No short of breath.  Physical Exam: Vitals:   10/28/21 1030 10/28/21 1045 10/28/21 1100 10/28/21 1115  BP: 126/60 (!) 132/58 136/63 134/72  Pulse: 71 72 72 72  Resp: 15 17 17 20   Temp:      TempSrc:      SpO2: 96% 97% 96% 96%  Weight:      Height:       General exam: Appears calm and comfortable  Respiratory system: Clear to auscultation. Respiratory effort normal. Cardiovascular system: S1 & S2 heard, RRR. No JVD, murmurs, rubs, gallops or clicks. No pedal edema. Gastrointestinal system: Abdomen is mildly distended without any tenderness.  Normal bowel sounds heard. Central nervous system: Alert and oriented. No focal neurological deficits. Extremities: Symmetric 5 x 5 power. Skin: No rashes, lesions or ulcers Psychiatry: Judgement and insight appear normal. Mood & affect appropriate.   Data Reviewed:  Lab results reviewed   Family Communication: None  Disposition: Status is: Inpatient Remains inpatient appropriate because: Severity of disease.  Planned Discharge Destination: Skilled nursing facility    Time spent: 55 minutes  Author: Sharen Hones, MD 10/28/2021 11:53 AM  For on call review [email or web address].

## 2021-10-28 NOTE — Consult Note (Signed)
[facility] GI Inpatient Consult Note   Kathline Magic, M.D.  Reason for Consult: Fecal impaction, obstipation   Attending Requesting Consult: Sharen Hones, M.D.  History of Present Illness: Katie [name] is a 66 y.o. female with a history of end-stage renal disease, type 2 diabetes mellitus, cellulitis of the left hand, admitted for pneumonia.  GI service was called due to complaints of fecal impaction obstipation.  Patient had only 2 very small bowel movements over the last 10 days and lactulose was not helping the patient.  Initially, the attending physician thought we could not give Nulytely secondary to patient end-stage renal disease, however, this mechanical gut lavage is not contraindicated.  The lavage, half of which was given yesterday and the patient had a massive bowel movement today.  Prior to that, I had ordered a smog enema which had yet to be given. Patient at this time says that she has much improved abdominal distention and denies any abdominal discomfort.  I spoke with the patient's nurse who confirmed the patient has no longer any fecal impaction.  Past Medical History:  Past Medical History:  Diagnosis Date   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[place])    Hypertension    Renal disorder    Type 2 diabetes mellitus ([place])     Problem List: Patient Active Problem List   Diagnosis Date Noted   Fecal impaction ([place]) 10/27/2021   Bent bone fracture of ulna 10/24/2021   Hospital acquired PNA 10/24/2021   Sepsis ([place]) 10/23/2021   Cellulitis 10/23/2021   Right flank pain 09/23/2021   Iron deficiency anemia, unspecified 03/27/2021   Allergy, unspecified, initial encounter 03/22/2021   Anaphylactic shock, unspecified, initial encounter 03/22/2021   Unspecified jaundice 03/15/2021   Allergy status to penicillin 03/14/2021   Anemia in chronic kidney disease 03/14/2021   Chronic diastolic (congestive) heart failure ([place]) 03/14/2021   Dependence on renal  dialysis ([place]) 03/14/2021   Depression, unspecified 03/14/2021   Hypertensive heart and chronic kidney disease with heart failure and with stage 5 chronic kidney disease, or end stage renal disease ([place]) 03/14/2021   Personal history of nicotine dependence 03/14/2021   Secondary hyperparathyroidism of renal origin ([place]) 03/14/2021   Frequent falls 03/07/2021   Homelessness 03/07/2021   Financial difficulty 03/07/2021   ESRD (end stage renal disease) ([place]) 12/23/2020   Persistent headaches 06/27/2020   ATN (acute tubular necrosis) ([place])    Pulmonary hypertension, unspecified (HCC)    Chronic kidney disease (CKD), stage IV (severe) (HCC)    Pleural effusion    Acute heart failure with preserved ejection fraction (HFpEF) ([place])    Pressure injury of skin 05/01/2020   Hyperkalemia    Volume overload 04/30/2020   Hypertension    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[place])    Type 2 diabetes mellitus ([place])     Past Surgical History: Past Surgical History:  Procedure Laterality Date   APPENDECTOMY     AV FISTULA PLACEMENT Left 03/16/2021   Procedure: INSERTION OF ARTERIOVENOUS (AV) GORE-TEX GRAFT ARM;  Surgeon: Algernon Huxley, MD;  Location: [facility] ORS;  Service: Vascular;  Laterality: Left;   DIALYSIS/PERMA CATHETER INSERTION N/A 05/16/2020   Procedure: DIALYSIS/PERMA CATHETER INSERTION;  Surgeon: Algernon Huxley, MD;  Location: [place] [facility];  Service: Cardiovascular;  Laterality: N/A;   MANDIBLE SURGERY     RENAL BIOPSY Right    RIGHT HEART CATH N/A 05/10/2020   Procedure: RIGHT  HEART CATH;  Surgeon: Nelva Bush, MD;  Location: [place] [facility];  Service: Cardiovascular;  Laterality: N/A;   TEMPORARY DIALYSIS CATHETER N/A 05/11/2020   Procedure: TEMPORARY DIALYSIS CATHETER;  Surgeon: Algernon Huxley, MD;  Location: [place] [facility];  Service: Cardiovascular;  Laterality: N/A;    Allergies: Allergies  Allergen Reactions   Penicillins Nausea And Vomiting    Pt states  "only pills" make her vomit Other reaction(s): Nausea/Vomit    Home Medications: Medications Prior to Admission  Medication Sig Dispense Refill Last Dose   acetaminophen (TYLENOL) 500 MG tablet Take 500 mg by mouth every 6 (six) hours as needed for moderate pain.   prn   amLODipine (NORVASC) 10 MG tablet Take 10 mg by mouth every evening.   09/27/2021   calcium acetate (PHOSLO) 667 MG capsule Take 1 capsule (667 mg total) by mouth 3 (three) times daily with meals.   09/27/2021   carvedilol (COREG) 12.5 MG tablet Take 1 tablet (12.5 mg total) by mouth every evening.   09/27/2021   lactulose (CHRONULAC) 10 GM/15ML solution Take 22.5 mLs (15 g total) by mouth daily. (Patient not taking: Reported on 09/24/2021) 236 mL 0    lidocaine (LIDODERM) 5 % Place 1 patch onto the skin daily. Remove & Discard patch within 12 hours or as directed by MD (Patient not taking: Reported on 09/30/2021) 30 patch 0 Not Taking   lidocaine-prilocaine (EMLA) cream Apply topically.   prn   oxyCODONE-acetaminophen (PERCOCET/ROXICET) 5-325 MG tablet Take 1-2 tablets by mouth every 4 (four) hours as needed for moderate pain or severe pain. 30 tablet 0 09/27/2021   senna-docusate (SENOKOT-S) 8.6-50 MG tablet Take 1 tablet by mouth 2 (two) times daily.   09/27/2021   tiZANidine (ZANAFLEX) 4 MG tablet Take 4 mg by mouth 2 (two) times daily as needed.   09/27/2021   topiramate (TOPAMAX) 50 MG tablet Take 1 tablet (50 mg total) by mouth 2 (two) times daily as needed (headache).   09/27/2021   traZODone (DESYREL) 50 MG tablet Take 1 tablet (50 mg total) by mouth every evening. 30 tablet 0 09/27/2021   venlafaxine (EFFEXOR) 37.5 MG tablet Take 1 tablet (37.5 mg total) by mouth 2 (two) times daily with a meal. 60 tablet 0 09/27/2021   Home medication reconciliation was completed with the patient.   Scheduled Inpatient Medications:    bisacodyl  10 mg Rectal Daily   calcium acetate  667 mg Oral TID WC   carvedilol  12.5 mg Oral QPM    Chlorhexidine Gluconate Cloth  6 each Topical Q0600   epoetin alfa       epoetin (EPOGEN/PROCRIT) injection  4,000 Units Intravenous Q T,Th,Sa-HD   feeding supplement (NEPRO CARB STEADY)  237 mL Oral TID BM   heparin  5,000 Units Subcutaneous Q12H   heparin sodium (porcine)       insulin aspart  0-15 Units Subcutaneous TID WC   lactulose  20 g Oral TID   lubiprostone  24 mcg Oral BID WC   metoCLOPramide (REGLAN) injection  10 mg Intravenous Once   multivitamin  1 tablet Oral QHS   ondansetron       pantoprazole  40 mg Oral BID   scopolamine  1 patch Transdermal Q72H   sodium chloride flush  3 mL Intravenous Q12H    Continuous Inpatient Infusions:    ceFEPime (MAXIPIME) IV Stopped (10/24/21 1440)   promethazine (PHENERGAN) injection (IM or IVPB)  PRN Inpatient Medications:  acetaminophen **OR** acetaminophen, epoetin alfa, guaiFENesin, heparin sodium (porcine), HYDROcodone-acetaminophen, ondansetron, ondansetron (ZOFRAN) IV, oxyCODONE-acetaminophen, tiZANidine, topiramate  Family History: family history includes Asthma in her mother; CAD in her father; Heart attack in her mother; Lung cancer in her brother; Parkinson's disease in her father; Prostate cancer in her father; Skin cancer in her father; Uterine cancer in her mother.   GI Family History: Negative  Social History:   reports that she has quit smoking. Her smoking use included cigarettes. She has never used smokeless tobacco. She reports that she does not currently use alcohol. She reports that she does not use drugs. The patient denies ETOH, tobacco, or drug use.    Review of Systems: Review of Systems - Negative except HPI  Physical Examination: BP 135/68   Pulse 78   Temp 98.4 F (36.9 C) (Oral)   Resp 19   Ht 5\' 6"  (1.676 m)   Wt 84.2 kg   SpO2 97%   BMI 29.96 kg/m  Physical Exam Constitutional:      Appearance: She is obese. She is not ill-appearing or diaphoretic.     Comments: Disheveled, ashen  appearance  HENT:     Head: Normocephalic and atraumatic.     Nose: Nose normal.  Eyes:     General: No scleral icterus.    Conjunctiva/sclera: Conjunctivae normal.     Pupils: Pupils are equal, round, and reactive to light.  Cardiovascular:     Rate and Rhythm: Normal rate.     Pulses: Normal pulses.     Heart sounds: No murmur heard.    No friction rub. No gallop.  Pulmonary:     Effort: Pulmonary effort is normal.     Breath sounds: No rales.  Abdominal:     General: There is no distension.     Palpations: Abdomen is soft. There is no mass.     Tenderness: There is no abdominal tenderness. There is no guarding or rebound.  Musculoskeletal:        General: Normal range of motion.     Cervical back: Normal range of motion.  Skin:    General: Skin is warm and dry.  Neurological:     General: No focal deficit present.     Mental Status: She is alert.  Psychiatric:        Mood and Affect: Mood normal.        Thought Content: Thought content normal.        Judgment: Judgment normal.     Data: Lab Results  Component Value Date   WBC 11.7 (H) 10/25/2021   HGB 8.8 (L) 10/25/2021   HCT 27.5 (L) 10/25/2021   MCV 94.2 10/25/2021   PLT 211 10/25/2021   Recent Labs  Lab 10/23/21 1605 10/24/21 0956 10/25/21 0437  HGB 9.6* 8.9* 8.8*   Lab Results  Component Value Date   NA 136 10/28/2021   K 3.5 10/28/2021   CL 99 10/28/2021   CO2 28 10/28/2021   BUN 23 10/28/2021   CREATININE 4.34 (H) 10/28/2021   Lab Results  Component Value Date   ALT 22 10/28/2021   AST 23 10/28/2021   ALKPHOS 102 10/28/2021   BILITOT 0.7 10/28/2021   No results for input(s): "APTT", "INR", "PTT" in the last 168 hours.    Latest Ref Rng & Units 10/25/2021    4:37 AM 10/24/2021    9:56 AM 10/23/2021    4:05 PM  CBC  WBC 4.0 -  10.5 K/uL 11.7  18.2  22.9   Hemoglobin 12.0 - 15.0 g/dL 8.8  8.9  9.6   Hematocrit 36.0 - 46.0 % 27.5  28.4  31.3   Platelets 150 - 400 K/uL 211  238  250      STUDIES: CT SHOULDER LEFT WO CONTRAST  Result Date: 10/26/2021 CLINICAL DATA:  Clinical suspicion for septic arthritis involving the left shoulder joint. EXAM: CT OF THE UPPER LEFT EXTREMITY WITHOUT CONTRAST TECHNIQUE: Multidetector CT imaging of the upper left extremity was performed according to the standard protocol. RADIATION DOSE REDUCTION: This exam was performed according to the departmental dose-optimization program which includes automated exposure control, adjustment of the mA and/or kV according to patient size and/or use of iterative reconstruction technique. COMPARISON:  None Available. FINDINGS: The glenohumeral joint is maintained. I do not see any destructive bony changes to suggest septic arthritis. No obvious joint effusion or inflammatory changes involving the shoulder musculature. There is some subcutaneous inflammatory change along the posterior deltoid muscle without discrete fluid collection or hematoma. The [facility] joint is intact. Mild degenerative changes. The acromion is flat or type 1 in shape. There is undersurface spurring change. Grossly by CT the rotator cuff tendons are intact. There is calcific tendinopathy the involving the supraspinatus attachment site. The visualized left ribs are intact and the visualized left lung is grossly clear. IMPRESSION: 1. No CT findings to suggest septic arthritis or osteomyelitis. 2. Mild subcutaneous inflammatory change along the posterior deltoid muscle without discrete fluid collection or hematoma. 3. Grossly by CT the rotator cuff tendons are intact. There is calcific tendinopathy involving the supraspinatus attachment site. Electronically Signed   By: Marijo Sanes M.D.   On: 10/26/2021 20:25   CT CHEST WO CONTRAST  Result Date: 10/26/2021 CLINICAL DATA:  Abnormal xray - lung nodule, >= 1 cm EXAM: CT CHEST WITHOUT CONTRAST TECHNIQUE: Multidetector CT imaging of the chest was performed following the standard protocol without IV contrast.  RADIATION DOSE REDUCTION: This exam was performed according to the departmental dose-optimization program which includes automated exposure control, adjustment of the mA and/or kV according to patient size and/or use of iterative reconstruction technique. COMPARISON:  12/12/2020 FINDINGS: Cardiovascular: Extensive multi-vessel coronary artery calcification. Calcification of the aortic valve leaflets noted. Moderate calcification of the mitral valve annulus. Global cardiac size within normal limits. No pericardial effusion. Central pulmonary arteries are of normal caliber. Mild atherosclerotic calcification within the thoracic aorta. No aortic aneurysm. Right internal jugular hemodialysis catheter tip noted within the superior right atrium. Mediastinum/Nodes: Multiple subcentimeter nodules and calcifications are seen within the thyroid gland, unlikely of clinical significance. No further follow-up is recommended for these lesions. There is shotty mediastinal adenopathy, possibly reactive in nature. No frankly pathologic thoracic adenopathy. The esophagus is unremarkable. Lungs/Pleura: There is dense right lower lobe consolidation, possibly related to acute infection or aspiration. Small right parapneumonic effusion. There is left lower lobe volume loss and traction bronchiectasis, likely the sequela of remote infection or inflammation. Small left pleural effusion is present. No pneumothorax. Central airways are widely patent. Upper Abdomen: Cholelithiasis without pericholecystic inflammatory change noted. No acute abnormality. Musculoskeletal: No acute bone abnormality. No lytic or blastic bone lesion. IMPRESSION: 1. Dense right lower lobe consolidation, possibly related to acute infection or aspiration. Small associated right parapneumonic effusion. 2. Small left pleural effusion. 3. Extensive multi-vessel coronary artery calcification. 4. Cholelithiasis. Electronically Signed   By: Fidela Salisbury M.D.   On:  10/26/2021 19:43   DG Abd 1  View  Result Date: 10/26/2021 CLINICAL DATA:  Abdominal pain with history of CKD. EXAM: ABDOMEN - 1 VIEW COMPARISON:  March 18, 2021. FINDINGS: Moderate gastric distension. Large volume stool throughout the colon. Large volume stool in the rectum. 10 cm distension of the cecum. On limited assessment no acute skeletal findings. Edema in the fat of the LEFT and RIGHT flank. Findings of dense consolidative changes in the LEFT lung base. IMPRESSION: 1. Large volume stool throughout the colon and rectum. Correlate with signs of constipation with rectal fecal impaction. Note that the cecum is dilated up to 10 cm, beyond this level of distension there is risk for perforation. 2. Moderate gastric distension without small bowel dilation visible on today's study. 3. Findings of dense consolidative changes in the LEFT lung base, correlate with any signs of pneumonia though there was basilar collapse on the study of Sep 23, 2021 which persists and is been present as far back as July of 2022. Consider follow-up dedicated chest CT to exclude endobronchial lesion as a cause for chronic lobar collapse. These results will be called to the ordering clinician or representative by the Radiologist Assistant, and communication documented in the PACS or [employer]. Electronically Signed   By: Zetta Bills M.D.   On: 10/26/2021 16:11   @IMAGES @  Assessment: Fecal impaction by x-ray - Unresponsive to tap water enemas, but responsive to orthograde gut lavage. Chronic narcotic use - Has negative effect on bowel function. Hypertension - On amlodipine, which causes constipation in some individuals. Pneumonia - Clinically improving. Homeless situation. ESRD - s/p hemodailysis this AM.  COVID-19 status: Tested negative.  Recommendations: Continue lactulose. Will order to decrease frequency to BID. Use narcotics sparingly, if at all. Avoid calcium channel blockers, if possible (patient on  amlodipine). GI sign off. Call back if needed.  Thank you for the consult. Please call with questions or concerns.  Olean Ree, "Lanny Hurst MD [facility] [place], [place] [address] [phone]  10/28/2021 1:15 PM

## 2021-10-28 NOTE — Plan of Care (Signed)

## 2021-10-28 NOTE — Progress Notes (Signed)
[facility]  Dialysis Note   Subjective:   Patient seen during dialysis Tolerating well    HEMODIALYSIS FLOWSHEET:  Blood Flow Rate (mL/min): 400 mL/min Arterial Pressure (mmHg): -170 mmHg Venous Pressure (mmHg): 110 mmHg Transmembrane Pressure (mmHg): 60 mmHg Ultrafiltration Rate (mL/min): 500 mL/min Dialysate Flow Rate (mL/min): 500 ml/min Conductivity: 13.8 Conductivity: 13.8 Dialysis Fluid Bolus: Normal Saline Bolus Amount (mL): 250 mL   Blood pressure today was low normal earlier today.  Antihypertensives were held. Patient is dealing with severe constipation.  She is getting stronger bowel regimen.  Feels like has to go to bathroom but will wait until dialysis is finished.  Objective:  Vital signs in last 24 hours:  Temp:  [97.9 F (36.6 C)-99.6 F (37.6 C)] 99.6 F (37.6 C) (06/17 0937) Pulse Rate:  [69-76] 71 (06/17 1030) Resp:  [14-21] 15 (06/17 1030) BP: (100-129)/(48-64) 126/60 (06/17 1030) SpO2:  [93 %-98 %] 96 % (06/17 1030) Weight:  [84.8 kg-86.4 kg] 84.8 kg (06/17 0957)  Weight change: 1.5 kg Filed Weights   10/27/21 0500 10/28/21 0446 10/28/21 0957  Weight: 84.2 kg 86.4 kg 84.8 kg    Intake/Output: No intake/output data recorded.   Intake/Output this shift:  No intake/output data recorded.  Physical Exam: General: NAD  Head: Normocephalic, atraumatic. Moist oral mucosal membranes  Eyes: Anicteric  Lungs:  Clear to auscultation, normal effort, congested cough  Heart: Regular rate and rhythm  Abdomen:  Soft, nontender  Extremities:  1+ peripheral edema.  Neurologic: Nonfocal, moving all four extremities  Skin: No lesions, left hand ring finger edema  Access: Right PermCath    Basic Metabolic Panel: Recent Labs  Lab 10/23/21 1605 10/23/21 2338 10/24/21 0956 10/25/21 0437 10/28/21 1005  NA 133*  --  134* 135 136  K 4.2  --  4.0 3.3* 3.5  CL 98  --  97* 100 99  CO2 30  --  25 29 28   GLUCOSE 103*  --  129* 118* 90  BUN  31*  --  36* 20 23  CREATININE 5.17* 5.41* 5.56* 3.46* 4.34*  CALCIUM 8.6*  --  8.4* 8.3* 8.6*  PHOS  --   --  3.0  --   --     Liver Function Tests: Recent Labs  Lab 10/23/21 1605 10/24/21 0956 10/28/21 1005  AST 15 15 23   ALT 14 13 22   ALKPHOS 101 96 102  BILITOT 0.7 0.8 0.7  PROT 6.7 6.3* 6.1*  ALBUMIN 2.7* 2.5* 2.3*   No results for input(s): "LIPASE", "AMYLASE" in the last 168 hours. No results for input(s): "AMMONIA" in the last 168 hours.  CBC: Recent Labs  Lab 10/23/21 1605 10/24/21 0956 10/25/21 0437  WBC 22.9* 18.2* 11.7*  NEUTROABS 19.6*  --   --   HGB 9.6* 8.9* 8.8*  HCT 31.3* 28.4* 27.5*  MCV 96.0 95.0 94.2  PLT 250 238 211    Cardiac Enzymes: No results for input(s): "CKTOTAL", "CKMB", "CKMBINDEX", "TROPONINI" in the last 168 hours.  BNP: Invalid input(s): "POCBNP"  CBG: Recent Labs  Lab 10/27/21 0810 10/27/21 1134 10/27/21 1623 10/27/21 2156 10/28/21 0750  GLUCAP 96 102* 129* 93 93    Microbiology: Results for orders placed or performed during the hospital encounter of 09/30/21  Resp Panel by RT-PCR (Flu A&B, Covid) Nasopharyngeal Swab     Status: None   Collection Time: 09/30/21  9:34 AM   Specimen: Nasopharyngeal Swab; Nasopharyngeal(NP) swabs in vial transport medium  Result Value Ref Range Status  SARS Coronavirus 2 by RT PCR NEGATIVE NEGATIVE Final    Comment: (NOTE) SARS-CoV-2 target nucleic acids are NOT DETECTED.  The SARS-CoV-2 RNA is generally detectable in upper respiratory specimens during the acute phase of infection. The lowest concentration of SARS-CoV-2 viral copies this assay can detect is 138 copies/mL. A negative result does not preclude SARS-Cov-2 infection and should not be used as the sole basis for treatment or other patient management decisions. A negative result may occur with  improper specimen collection/handling, submission of specimen other than nasopharyngeal swab, presence of viral mutation(s) within  the areas targeted by this assay, and inadequate number of viral copies(<138 copies/mL). A negative result must be combined with clinical observations, patient history, and epidemiological information. The expected result is Negative.  Fact Sheet for Patients:  EntrepreneurPulse.com.au  Fact Sheet for Healthcare Providers:  IncredibleEmployment.be  This test is no t yet approved or cleared by the [place] FDA and  has been authorized for detection and/or diagnosis of SARS-CoV-2 by FDA under an Emergency Use Authorization (EUA). This EUA will remain  in effect (meaning this test can be used) for the duration of the COVID-19 declaration under Section 564(b)(1) of the Act, 21 U.S.C.section 360bbb-3(b)(1), unless the authorization is terminated  or revoked sooner.       Influenza A by PCR NEGATIVE NEGATIVE Final   Influenza B by PCR NEGATIVE NEGATIVE Final    Comment: (NOTE) The Xpert Xpress SARS-CoV-2/FLU/RSV plus assay is intended as an aid in the diagnosis of influenza from Nasopharyngeal swab specimens and should not be used as a sole basis for treatment. Nasal washings and aspirates are unacceptable for Xpert Xpress SARS-CoV-2/FLU/RSV testing.  Fact Sheet for Patients: EntrepreneurPulse.com.au  Fact Sheet for Healthcare Providers: IncredibleEmployment.be  This test is not yet approved or cleared by the [place] FDA and has been authorized for detection and/or diagnosis of SARS-CoV-2 by FDA under an Emergency Use Authorization (EUA). This EUA will remain in effect (meaning this test can be used) for the duration of the COVID-19 declaration under Section 564(b)(1) of the Act, 21 U.S.C. section 360bbb-3(b)(1), unless the authorization is terminated or revoked.  Performed at [facility], [address] [place],  [address]   SARS Coronavirus 2 by RT PCR (hospital order,  performed in [facility] hospital lab) *cepheid single result test* Anterior Nasal Swab     Status: None   Collection Time: 10/23/21  4:06 PM   Specimen: Anterior Nasal Swab  Result Value Ref Range Status   SARS Coronavirus 2 by RT PCR NEGATIVE NEGATIVE Final    Comment: (NOTE) SARS-CoV-2 target nucleic acids are NOT DETECTED.  The SARS-CoV-2 RNA is generally detectable in upper and lower respiratory specimens during the acute phase of infection. The lowest concentration of SARS-CoV-2 viral copies this assay can detect is 250 copies / mL. A negative result does not preclude SARS-CoV-2 infection and should not be used as the sole basis for treatment or other patient management decisions.  A negative result may occur with improper specimen collection / handling, submission of specimen other than nasopharyngeal swab, presence of viral mutation(s) within the areas targeted by this assay, and inadequate number of viral copies (<250 copies / mL). A negative result must be combined with clinical observations, patient history, and epidemiological information.  Fact Sheet for Patients:   https://www.patel.info/  Fact Sheet for Healthcare Providers: https://hall.com/  This test is not yet approved or  cleared by the [place] FDA and has been  authorized for detection and/or diagnosis of SARS-CoV-2 by FDA under an Emergency Use Authorization (EUA).  This EUA will remain in effect (meaning this test can be used) for the duration of the COVID-19 declaration under Section 564(b)(1) of the Act, 21 U.S.C. section 360bbb-3(b)(1), unless the authorization is terminated or revoked sooner.  Performed at [facility], [address] [place], [place] [address]   Blood culture (routine x 2)     Status: None   Collection Time: 10/23/21  4:06 PM   Specimen: BLOOD  Result Value Ref Range Status   Specimen Description BLOOD BLOOD RIGHT HAND   Final   Special Requests   Final    BOTTLES DRAWN AEROBIC AND ANAEROBIC Blood Culture adequate volume   Culture   Final    NO GROWTH 5 DAYS Performed at [facility], [address], [place], [place] [address]    Report Status 10/28/2021 FINAL  Final  Culture, blood (Routine X 2) w Reflex to ID Panel     Status: None (Preliminary result)   Collection Time: 10/24/21 12:06 AM   Specimen: BLOOD  Result Value Ref Range Status   Specimen Description BLOOD LEFT HAND  Final   Special Requests   Final    BOTTLES DRAWN AEROBIC AND ANAEROBIC Blood Culture adequate volume   Culture   Final    NO GROWTH 4 DAYS Performed at [facility], [address] [place], [place] [address]    Report Status PENDING  Incomplete  MRSA Next Gen by PCR, Nasal     Status: None   Collection Time: 10/24/21  2:24 PM   Specimen: Nasal Mucosa; Nasal Swab  Result Value Ref Range Status   MRSA by PCR Next Gen NOT DETECTED NOT DETECTED Final    Comment: (NOTE) The GeneXpert MRSA Assay (FDA approved for NASAL specimens only), is one component of a comprehensive MRSA colonization surveillance program. It is not intended to diagnose MRSA infection nor to guide or monitor treatment for MRSA infections. Test performance is not FDA approved in patients less than 57 years old. Performed at [facility], [place],  [address]   Respiratory (~20 pathogens) panel by PCR     Status: None   Collection Time: 10/24/21  2:24 PM   Specimen: Nasopharyngeal Swab; Respiratory  Result Value Ref Range Status   Adenovirus NOT DETECTED NOT DETECTED Final   Coronavirus 229E NOT DETECTED NOT DETECTED Final    Comment: (NOTE) The Coronavirus on the Respiratory Panel, DOES NOT test for the novel  Coronavirus (2019 nCoV)    Coronavirus HKU1 NOT DETECTED NOT DETECTED Final   Coronavirus NL63 NOT DETECTED NOT DETECTED Final   Coronavirus OC43 NOT DETECTED NOT DETECTED Final    Metapneumovirus NOT DETECTED NOT DETECTED Final   Rhinovirus / Enterovirus NOT DETECTED NOT DETECTED Final   Influenza A NOT DETECTED NOT DETECTED Final   Influenza B NOT DETECTED NOT DETECTED Final   Parainfluenza Virus 1 NOT DETECTED NOT DETECTED Final   Parainfluenza Virus 2 NOT DETECTED NOT DETECTED Final   Parainfluenza Virus 3 NOT DETECTED NOT DETECTED Final   Parainfluenza Virus 4 NOT DETECTED NOT DETECTED Final   Respiratory Syncytial Virus NOT DETECTED NOT DETECTED Final   Bordetella pertussis NOT DETECTED NOT DETECTED Final   Bordetella Parapertussis NOT DETECTED NOT DETECTED Final   Chlamydophila pneumoniae NOT DETECTED NOT DETECTED Final   Mycoplasma pneumoniae NOT DETECTED NOT DETECTED Final    Comment: Performed at  [facility], [place] [address] [place], [place] [address]    Coagulation Studies: No results for input(s): "LABPROT", "INR" in the last 72 hours.  Urinalysis: No results for input(s): "COLORURINE", "LABSPEC", "PHURINE", "GLUCOSEU", "HGBUR", "BILIRUBINUR", "KETONESUR", "PROTEINUR", "UROBILINOGEN", "NITRITE", "LEUKOCYTESUR" in the last 72 hours.  Invalid input(s): "APPERANCEUR"    Imaging: CT SHOULDER LEFT WO CONTRAST  Result Date: 10/26/2021 CLINICAL DATA:  Clinical suspicion for septic arthritis involving the left shoulder joint. EXAM: CT OF THE UPPER LEFT EXTREMITY WITHOUT CONTRAST TECHNIQUE: Multidetector CT imaging of the upper left extremity was performed according to the standard protocol. RADIATION DOSE REDUCTION: This exam was performed according to the departmental dose-optimization program which includes automated exposure control, adjustment of the mA and/or kV according to patient size and/or use of iterative reconstruction technique. COMPARISON:  None Available. FINDINGS: The glenohumeral joint is maintained. I do not see any destructive bony changes to suggest septic arthritis. No obvious joint effusion or inflammatory changes involving the  shoulder musculature. There is some subcutaneous inflammatory change along the posterior deltoid muscle without discrete fluid collection or hematoma. The [facility] joint is intact. Mild degenerative changes. The acromion is flat or type 1 in shape. There is undersurface spurring change. Grossly by CT the rotator cuff tendons are intact. There is calcific tendinopathy the involving the supraspinatus attachment site. The visualized left ribs are intact and the visualized left lung is grossly clear. IMPRESSION: 1. No CT findings to suggest septic arthritis or osteomyelitis. 2. Mild subcutaneous inflammatory change along the posterior deltoid muscle without discrete fluid collection or hematoma. 3. Grossly by CT the rotator cuff tendons are intact. There is calcific tendinopathy involving the supraspinatus attachment site. Electronically Signed   By: Marijo Sanes M.D.   On: 10/26/2021 20:25   CT CHEST WO CONTRAST  Result Date: 10/26/2021 CLINICAL DATA:  Abnormal xray - lung nodule, >= 1 cm EXAM: CT CHEST WITHOUT CONTRAST TECHNIQUE: Multidetector CT imaging of the chest was performed following the standard protocol without IV contrast. RADIATION DOSE REDUCTION: This exam was performed according to the departmental dose-optimization program which includes automated exposure control, adjustment of the mA and/or kV according to patient size and/or use of iterative reconstruction technique. COMPARISON:  12/12/2020 FINDINGS: Cardiovascular: Extensive multi-vessel coronary artery calcification. Calcification of the aortic valve leaflets noted. Moderate calcification of the mitral valve annulus. Global cardiac size within normal limits. No pericardial effusion. Central pulmonary arteries are of normal caliber. Mild atherosclerotic calcification within the thoracic aorta. No aortic aneurysm. Right internal jugular hemodialysis catheter tip noted within the superior right atrium. Mediastinum/Nodes: Multiple subcentimeter nodules  and calcifications are seen within the thyroid gland, unlikely of clinical significance. No further follow-up is recommended for these lesions. There is shotty mediastinal adenopathy, possibly reactive in nature. No frankly pathologic thoracic adenopathy. The esophagus is unremarkable. Lungs/Pleura: There is dense right lower lobe consolidation, possibly related to acute infection or aspiration. Small right parapneumonic effusion. There is left lower lobe volume loss and traction bronchiectasis, likely the sequela of remote infection or inflammation. Small left pleural effusion is present. No pneumothorax. Central airways are widely patent. Upper Abdomen: Cholelithiasis without pericholecystic inflammatory change noted. No acute abnormality. Musculoskeletal: No acute bone abnormality. No lytic or blastic bone lesion. IMPRESSION: 1. Dense right lower lobe consolidation, possibly related to acute infection or aspiration. Small associated right parapneumonic effusion. 2. Small left pleural effusion. 3. Extensive multi-vessel coronary artery calcification. 4. Cholelithiasis. Electronically Signed   By: Linwood Dibbles.D.  On: 10/26/2021 19:43   DG Abd 1 View  Result Date: 10/26/2021 CLINICAL DATA:  Abdominal pain with history of CKD. EXAM: ABDOMEN - 1 VIEW COMPARISON:  March 18, 2021. FINDINGS: Moderate gastric distension. Large volume stool throughout the colon. Large volume stool in the rectum. 10 cm distension of the cecum. On limited assessment no acute skeletal findings. Edema in the fat of the LEFT and RIGHT flank. Findings of dense consolidative changes in the LEFT lung base. IMPRESSION: 1. Large volume stool throughout the colon and rectum. Correlate with signs of constipation with rectal fecal impaction. Note that the cecum is dilated up to 10 cm, beyond this level of distension there is risk for perforation. 2. Moderate gastric distension without small bowel dilation visible on today's study. 3.  Findings of dense consolidative changes in the LEFT lung base, correlate with any signs of pneumonia though there was basilar collapse on the study of Sep 23, 2021 which persists and is been present as far back as July of 2022. Consider follow-up dedicated chest CT to exclude endobronchial lesion as a cause for chronic lobar collapse. These results will be called to the ordering clinician or representative by the Radiologist Assistant, and communication documented in the PACS or [employer]. Electronically Signed   By: Zetta Bills M.D.   On: 10/26/2021 16:11     Medications:    ceFEPime (MAXIPIME) IV Stopped (10/24/21 1440)   promethazine (PHENERGAN) injection (IM or IVPB)      bisacodyl  10 mg Rectal Daily   calcium acetate  667 mg Oral TID WC   carvedilol  12.5 mg Oral QPM   Chlorhexidine Gluconate Cloth  6 each Topical Q0600   epoetin (EPOGEN/PROCRIT) injection  4,000 Units Intravenous Q T,Th,Sa-HD   feeding supplement (NEPRO CARB STEADY)  237 mL Oral TID BM   heparin  5,000 Units Subcutaneous Q12H   heparin sodium (porcine)       insulin aspart  0-15 Units Subcutaneous TID WC   lactulose  20 g Oral TID   lubiprostone  24 mcg Oral BID WC   metoCLOPramide (REGLAN) injection  10 mg Intravenous Once   multivitamin  1 tablet Oral QHS   ondansetron       pantoprazole  40 mg Oral BID   scopolamine  1 patch Transdermal Q72H   sodium chloride flush  3 mL Intravenous Q12H   acetaminophen **OR** acetaminophen, guaiFENesin, heparin sodium (porcine), HYDROcodone-acetaminophen, ondansetron, ondansetron (ZOFRAN) IV, oxyCODONE-acetaminophen, tiZANidine, topiramate  Assessment/ Plan:  [name] is a 66 y.o.  female with past medical history of ESRD with hemodialysis TTS, diabetes mellitus, hypertension to the hospital seeking long term placement. She was recently admitted and received antibiotics for a dog bite.  She was discharged with a prescription for home antibiotics.  She states  her caregivers were unable to retrieve antibiotics.  She states that there was an argument and that she is here seeking long-term placement.  Principal Problem:   Sepsis ([place]) Active Problems:   Hypertension   Type 2 diabetes mellitus ([place])   ESRD (end stage renal disease) ([place])   Financial difficulty   Anemia in chronic kidney disease   Cellulitis   Bent bone fracture of ulna   Hospital acquired PNA   Fecal impaction ([place])   [facility]/TTS/right PermCath  End Stage Renal Disease on hemodialysis:   Currently on TTS schedule. Did not get dialyzed in chair today.  We will try again next week.  No results  found for: "POTASSIUM" No intake or output data in the 24 hours ending 10/28/21 1127   2. Hypertension with chronic kidney disease:   Blood pressure low normal.  Amlodipine discontinued.   BP 126/60   Pulse 71   Temp 99.6 F (37.6 C) (Oral)   Resp 15   Ht 5\' 6"  (1.676 m)   Wt 84.8 kg   SpO2 96%   BMI 30.17 kg/m   3. Anemia of chronic kidney disease/ kidney injury/chronic disease/acute blood loss:   Lab Results  Component Value Date   HGB 8.8 (L) 10/25/2021   Continue low-dose EPO with dialysis treatments.  4. Secondary Hyperparathyroidism:     Lab Results  Component Value Date   PTH 70 (H) 05/12/2020   CALCIUM 8.6 (L) 10/28/2021   PHOS 3.0 10/24/2021   We will continue to monitor bone minerals during this admission.  Continue calcium acetate with meals  5.  Severe constipation Bowel regimen of Dulcolax, lactulose, Nulytely    LOS: [facility] 6/17/202311:27 AM

## 2021-10-28 NOTE — NC FL2 (Incomplete)
[place] LEVEL OF CARE SCREENING TOOL     IDENTIFICATION  Patient Name: Katie [name] Birthdate: 1955-10-13 Sex: female Admission Date (Current Location): 09/30/2021  [place] and [place] Number:  Engineering geologist and Address:  [facility], [address], [place], [place] [address]      Provider Number: [id number]  Attending Physician Name and Address:  Sharen Hones, MD  Relative Name and Phone Number:       Current Level of Care: Other (Comment) ([place], ALF) Recommended Level of Care: [place], [facility] Prior Approval Number:    Date Approved/Denied:   PASRR Number: [id number]  Discharge Plan: Other (Comment) (Family Care home/ ALF)    Current Diagnoses: Patient Active Problem List   Diagnosis Date Noted   Fecal impaction ([place]) 10/27/2021   Bent bone fracture of ulna 10/24/2021   Hospital acquired PNA 10/24/2021   Sepsis ([place]) 10/23/2021   Cellulitis 10/23/2021   Right flank pain 09/23/2021   Iron deficiency anemia, unspecified 03/27/2021   Allergy, unspecified, initial encounter 03/22/2021   Anaphylactic shock, unspecified, initial encounter 03/22/2021   Unspecified jaundice 03/15/2021   Allergy status to penicillin 03/14/2021   Anemia in chronic kidney disease 03/14/2021   Chronic diastolic (congestive) heart failure ([place]) 03/14/2021   Dependence on renal dialysis ([place]) 03/14/2021   Depression, unspecified 03/14/2021   Hypertensive heart and chronic kidney disease with heart failure and with stage 5 chronic kidney disease, or end stage renal disease ([place]) 03/14/2021   Personal history of nicotine dependence 03/14/2021   Secondary hyperparathyroidism of renal origin ([place]) 03/14/2021   Frequent falls 03/07/2021   Homelessness 03/07/2021   Financial difficulty 03/07/2021   ESRD (end stage renal disease) ([place]) 12/23/2020   Persistent headaches 06/27/2020   ATN (acute tubular necrosis)  (HCC)    Pulmonary hypertension, unspecified (HCC)    Chronic kidney disease (CKD), stage IV (severe) (HCC)    Pleural effusion    Acute heart failure with preserved ejection fraction (HFpEF) (HCC)    Pressure injury of skin 05/01/2020   Hyperkalemia    Volume overload 04/30/2020   Hypertension    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HCC)    Type 2 diabetes mellitus (HCC)     Orientation RESPIRATION BLADDER Height & Weight     Self, Time, Situation, Place  Normal Continent Weight: 84.2 kg Height:  5\' 6"  (167.6 cm)  BEHAVIORAL SYMPTOMS/MOOD NEUROLOGICAL BOWEL NUTRITION STATUS      Continent Diet (Regular diet)  AMBULATORY STATUS COMMUNICATION OF NEEDS Skin   Limited Assist Verbally Normal                       Personal Care Assistance Level of Assistance  Bathing, Feeding, Dressing Bathing Assistance: Limited assistance Feeding assistance: Independent Dressing Assistance: Limited assistance     Functional Limitations Info  Sight, Hearing, Speech Sight Info: Impaired Hearing Info: Adequate Speech Info: Adequate    SPECIAL CARE FACTORS FREQUENCY  PT (By licensed PT), OT (By licensed OT)     PT Frequency: home health OT Frequency: home health            Contractures Contractures Info: Not present    Additional Factors Info  Code Status, Allergies Code Status Info: Full Allergies Info: Penicillins           Current Medications (10/28/2021):  This is the current hospital active medication list Current Facility-Administered Medications  Medication Dose Route Frequency Provider Last Rate Last Admin   acetaminophen (TYLENOL) tablet 650 mg  650 mg Oral Q6H PRN Para Skeans, MD   650 mg at 10/24/21 1728   Or   acetaminophen (TYLENOL) suppository 650 mg  650 mg Rectal Q6H PRN Para Skeans, MD       bisacodyl (DULCOLAX) suppository 10 mg  10 mg Rectal Daily Sharen Hones, MD   10 mg at 10/27/21 1005   calcium acetate (PHOSLO) capsule 667 mg  667 mg  Oral TID WC Para Skeans, MD   667 mg at 10/28/21 1500   carvedilol (COREG) tablet 12.5 mg  12.5 mg Oral QPM Florina Ou V, MD   12.5 mg at 10/27/21 1703   ceFEPIme (MAXIPIME) 2 g in sodium chloride 0.9 % 100 mL IVPB  2 g Intravenous Q T,Th,Sa-HD Tsosie Billing, MD   Stopped at 10/24/21 1440   Chlorhexidine Gluconate Cloth 2 % PADS 6 each  6 each Topical Q0600 Para Skeans, MD   6 each at 10/27/21 1006   epoetin alfa (EPOGEN) 4000 UNIT/ML injection            epoetin alfa (EPOGEN) injection 4,000 Units  4,000 Units Intravenous Q T,Th,Sa-HD Breeze, Benancio Deeds, NP   4,000 Units at 10/28/21 1216   feeding supplement (NEPRO CARB STEADY) liquid 237 mL  237 mL Oral TID BM Emeterio Reeve, DO   237 mL at 10/27/21 2249   guaiFENesin (ROBITUSSIN) 100 MG/5ML liquid 10 mL  10 mL Oral Q4H PRN Para Skeans, MD   10 mL at 10/21/21 2356   heparin injection 5,000 Units  5,000 Units Subcutaneous Q12H Florina Ou V, MD   5,000 Units at 10/27/21 2251   heparin sodium (porcine) 1000 UNIT/ML injection            HYDROcodone-acetaminophen (NORCO/VICODIN) 5-325 MG per tablet 1 tablet  1 tablet Oral Q4H PRN Para Skeans, MD       insulin aspart (novoLOG) injection 0-15 Units  0-15 Units Subcutaneous TID WC Para Skeans, MD   2 Units at 10/27/21 1703   lactulose (CHRONULAC) 10 GM/15ML solution 20 g  20 g Oral BID [place], Lorie Apley K, MD       lubiprostone (AMITIZA) capsule 24 mcg  24 mcg Oral BID WC Sharen Hones, MD   24 mcg at 10/27/21 1726   metoCLOPramide (REGLAN) injection 10 mg  10 mg Intravenous Once Para Skeans, MD       multivitamin (RENA-VIT) tablet 1 tablet  1 tablet Oral QHS Emeterio Reeve, DO   1 tablet at 10/27/21 2251   ondansetron (ZOFRAN) 4 MG/2ML injection            ondansetron (ZOFRAN) injection 4 mg  4 mg Intravenous Q6H PRN Murlean Iba, MD   4 mg at 10/28/21 1017   oxyCODONE-acetaminophen (PERCOCET/ROXICET) 5-325 MG per tablet 1 tablet  1 tablet Oral Q6H PRN Para Skeans, MD   1  tablet at 10/27/21 2251   pantoprazole (PROTONIX) EC tablet 40 mg  40 mg Oral BID Darnelle Bos, RPH   40 mg at 10/27/21 2251   promethazine (PHENERGAN) 6.25 mg in sodium chloride 0.9 % 50 mL IVPB  6.25 mg Intravenous Once Emeterio Reeve, DO       scopolamine (TRANSDERM-SCOP) 1 MG/3DAYS 1.5 mg  1 patch Transdermal Q72H Emeterio Reeve, DO   1.5 mg at 10/27/21 1726   sodium chloride flush (NS) 0.9 % injection  3 mL  3 mL Intravenous Q12H Florina Ou V, MD   3 mL at 10/27/21 2252   tiZANidine (ZANAFLEX) tablet 4 mg  4 mg Oral BID PRN Para Skeans, MD   4 mg at 10/27/21 1355   topiramate (TOPAMAX) tablet 50 mg  50 mg Oral BID PRN Para Skeans, MD         Discharge Medications: Please see discharge summary for a list of discharge medications.  Relevant Imaging Results:  Relevant Lab Results:   Additional Information ss [id number]  Dialysis T [address]  Izola Price, RN

## 2021-10-29 ENCOUNTER — Inpatient Hospital Stay: Payer: Medicaid Other

## 2021-10-29 DIAGNOSIS — K5641 Fecal impaction: Secondary | ICD-10-CM | POA: Diagnosis not present

## 2021-10-29 DIAGNOSIS — A419 Sepsis, unspecified organism: Secondary | ICD-10-CM | POA: Diagnosis not present

## 2021-10-29 DIAGNOSIS — J189 Pneumonia, unspecified organism: Secondary | ICD-10-CM | POA: Diagnosis not present

## 2021-10-29 LAB — GLUCOSE, CAPILLARY
Glucose-Capillary: 82 mg/dL (ref 70–99)
Glucose-Capillary: 190 mg/dL — ABNORMAL HIGH (ref 70–99)
Glucose-Capillary: 137 mg/dL — ABNORMAL HIGH (ref 70–99)
Glucose-Capillary: 109 mg/dL — ABNORMAL HIGH (ref 70–99)

## 2021-10-29 LAB — CBC
HCT: 28.8 % — ABNORMAL LOW (ref 36.0–46.0)
Hemoglobin: 8.9 g/dL — ABNORMAL LOW (ref 12.0–15.0)
MCH: 29.2 pg (ref 26.0–34.0)
MCHC: 30.9 g/dL (ref 30.0–36.0)
MCV: 94.4 fL (ref 80.0–100.0)
Platelets: 284 10*3/uL (ref 150–400)
RBC: 3.05 MIL/uL — ABNORMAL LOW (ref 3.87–5.11)
RDW: 14.4 % (ref 11.5–15.5)
WBC: 9.9 10*3/uL (ref 4.0–10.5)
nRBC: 0 % (ref 0.0–0.2)

## 2021-10-29 LAB — BASIC METABOLIC PANEL
Anion gap: 8 (ref 5–15)
BUN: 16 mg/dL (ref 8–23)
CO2: 27 mmol/L (ref 22–32)
Calcium: 8.8 mg/dL — ABNORMAL LOW (ref 8.9–10.3)
Chloride: 103 mmol/L (ref 98–111)
Creatinine, Ser: 3.4 mg/dL — ABNORMAL HIGH (ref 0.44–1.00)
GFR, Estimated: 15 mL/min — ABNORMAL LOW (ref >60)
Glucose, Bld: 83 mg/dL (ref 70–99)
Potassium: 3.8 mmol/L (ref 3.5–5.1)
Sodium: 138 mmol/L (ref 135–145)

## 2021-10-29 LAB — MAGNESIUM: Magnesium: 2.1 mg/dL (ref 1.7–2.4)

## 2021-10-29 LAB — CULTURE, BLOOD (ROUTINE X 2)
Culture: NO GROWTH
Special Requests: ADEQUATE

## 2021-10-29 NOTE — Progress Notes (Signed)
Mobility Specialist - Progress Note    10/29/21 1000  Mobility  Activity Transferred from bed to chair;Stood at bedside;Ambulated with assistance in room  Level of Assistance Standby assist, set-up cues, supervision of patient - no hands on  Assistive Device Front wheel walker  Distance Ambulated (ft) 3 ft  Activity Response Tolerated well  $Mobility charge 1 Mobility    Pt supine upon arrival using with Vega off using RA with O2 at 89%. Completes bed mobility ModI, STS CGA-supervision and completes B-C with supervision. Pt is left in recliner with needs in reach and chair alarm set.   Merrily Brittle Mobility Specialist 10/29/21, 10:20 AM

## 2021-10-29 NOTE — Progress Notes (Signed)
  Progress Note   Patient: Katie [name] KPV:[id number] DOB: [birth year] DOA: 09/30/2021     6 DOS: the patient was seen and examined on 10/29/2021   Brief hospital course: Katie [name] is a 66 y.o. female with medical history significant of ESRD, DM II, homelessness. Had been admitted 05/13-05/17 for sepsis d/t cellulits from animal bite to arm, discharged on po abx and was unable to fill the Rx, was kicked out of where she was living, no way to get HD.  Patient has been in the ED since arriving the hospital.  She was admitted to the hospital again on 10/23/2021 as patient developed cough short of breath and fever.  Chest x-ray showed pneumonia.  Patient was diagnosed with sepsis and pneumonia, was started on cefepime and vancomycin. Had MRI performed on 6/14 did not show any osteomyelitis. Patient is currently homeless, has a severe weakness.  No discharge option at this time. 6/15.  Patient has no nausea, KUB showed fecal impaction with severe dilated colon, patient was started on senna as well as scheduled lactulose.  CT chest also showed dense right lower lobe consolidation consistent with aspiration pneumonia.  Shoulder x-ray does not show any osteomyelitis versus septic joint. Patient fecal impaction resolved after [place] on 6/17.  Assessment and Plan: Sepsis. Healthcare associate pneumonia. Recent dog bites with cellulitis. Condition has resolved, antibiotics completed.   Fecal impaction. Patient had a massive bowel movements last night, repeated KUB this morning much improved.  Continue scheduled lactulose and Amitiza for now.   End stage renal disease on dialysis. Anemia of chronic kidney disease. Hypokalemia. Followed by nephrology.  Type 2 diabetes. Continue current regimen.  Essential hypertension. Continue to follow      Subjective:  Patient doing much better today, she had a massive bowel movement last night.  No longer had any nausea.  No abdominal pain.  Physical  Exam: Vitals:   10/28/21 2148 10/29/21 0421 10/29/21 0500 10/29/21 0751  BP: (!) 125/51 (!) 119/55  (!) 115/58  Pulse: 84 80  82  Resp: 20 20  19   Temp: 99 F (37.2 C) 98.2 F (36.8 C)  98.6 F (37 C)  TempSrc:  Oral  Oral  SpO2: 96% 95%  96%  Weight:   79.7 kg   Height:       General exam: Appears calm and comfortable  Respiratory system: Clear to auscultation. Respiratory effort normal. Cardiovascular system: S1 & S2 heard, RRR. No JVD, murmurs, rubs, gallops or clicks. No pedal edema. Gastrointestinal system: Abdomen is nondistended, soft and nontender. No organomegaly or masses felt. Normal bowel sounds heard. Central nervous system: Alert and oriented. No focal neurological deficits. Extremities: Symmetric 5 x 5 power. Skin: No rashes, lesions or ulcers Psychiatry: Judgement and insight appear normal. Mood & affect appropriate.   Data Reviewed:  X-ray and lab results reviewed.  Family Communication:   Disposition: Status is: Inpatient Remains inpatient appropriate because: Unsafe discharge  Planned Discharge Destination: Skilled nursing facility    Time spent: 28 minutes  Author: Sharen Hones, MD 10/29/2021 12:55 PM  For on call review [email or web address].

## 2021-10-29 NOTE — TOC Progression Note (Signed)
Transition of Care [facility]) - Progression Note    Patient Details  Name: Katie [name] MRN: [id number] Date of Birth: 05/03/1956  [facility] CM/SW Contact  Izola Price, RN Phone Number: 10/29/2021, 9:49 AM  Clinical Narrative: 6/17: Resubmitted information for SNF placement as recommendation changed back to SNF. Managed Medicaid. Simmie Davies RN CM     Expected Discharge Plan: Skilled Nursing Facility Barriers to Discharge: SNF Pending bed offer  Expected Discharge Plan and Services Expected Discharge Plan: [place]   Discharge Planning Services: CM Consult Post Acute Care Choice: [place] Living arrangements for the past 2 months: Single Family Home                 DME Arranged: N/A DME Agency: NA       HH Arranged: NA HH Agency: NA         Social Determinants of Health (SDOH) Interventions    Readmission Risk Interventions    09/27/2021    1:44 PM 09/26/2021   12:46 PM 03/08/2021    2:49 PM  Readmission Risk Prevention Plan  Transportation Screening  Complete Complete  Palliative Care Screening Not Applicable    Medication Review (RN Care Manager)  Complete Complete  HRI or Home Care Consult   Complete  SW Recovery Care/Counseling Consult  Complete   Palliative Care Screening  Not [place]  Not Applicable Complete

## 2021-10-29 NOTE — Plan of Care (Signed)

## 2021-10-29 NOTE — Progress Notes (Signed)
[facility]  Dialysis Note   Subjective:   Patient reports she is not in a good mood this morning.  Sitting in the chair eating her breakfast.  She had a good bowel movement yesterday and feels relieved from that.  Objective:  Vital signs in last 24 hours:  Temp:  [98 F (36.7 C)-99 F (37.2 C)] 98.6 F (37 C) (06/18 0751) Pulse Rate:  [80-89] 82 (06/18 0751) Resp:  [19-20] 19 (06/18 0751) BP: (115-134)/(51-67) 115/58 (06/18 0751) SpO2:  [95 %-96 %] 96 % (06/18 0751) Weight:  [79.7 kg] 79.7 kg (06/18 0500)  Weight change: -1.6 kg Filed Weights   10/28/21 1227 10/28/21 1237 10/29/21 0500  Weight: 84.2 kg 84.2 kg 79.7 kg    Intake/Output: I/O last 3 completed shifts: In: -  Out: 555 [Other:554; Stool:1]   Intake/Output this shift:  Total I/O In: 240 [P.O.:240] Out: -   Physical Exam: General: NAD  Head: Normocephalic, atraumatic. Moist oral mucosal membranes  Eyes: Anicteric  Lungs:  Clear to auscultation, normal effort, congested cough  Heart: Regular rate and rhythm  Abdomen:  Soft, nontender  Extremities:  1+ peripheral edema.  Neurologic: Nonfocal, moving all four extremities  Skin: No lesions, left hand ring finger edema  Access: Right PermCath    Basic Metabolic Panel: Recent Labs  Lab 10/23/21 1605 10/23/21 2338 10/24/21 0956 10/25/21 0437 10/28/21 1005 10/29/21 0452  NA 133*  --  134* 135 136 138  K 4.2  --  4.0 3.3* 3.5 3.8  CL 98  --  97* 100 99 103  CO2 30  --  25 29 28 27   GLUCOSE 103*  --  129* 118* 90 83  BUN 31*  --  36* 20 23 16   CREATININE 5.17* 5.41* 5.56* 3.46* 4.34* 3.40*  CALCIUM 8.6*  --  8.4* 8.3* 8.6* 8.8*  MG  --   --   --   --   --  2.1  PHOS  --   --  3.0  --   --   --     Liver Function Tests: Recent Labs  Lab 10/23/21 1605 10/24/21 0956 10/28/21 1005  AST 15 15 23   ALT 14 13 22   ALKPHOS 101 96 102  BILITOT 0.7 0.8 0.7  PROT 6.7 6.3* 6.1*  ALBUMIN 2.7* 2.5* 2.3*   No results for input(s):  "LIPASE", "AMYLASE" in the last 168 hours. No results for input(s): "AMMONIA" in the last 168 hours.  CBC: Recent Labs  Lab 10/23/21 1605 10/24/21 0956 10/25/21 0437 10/29/21 0452  WBC 22.9* 18.2* 11.7* 9.9  NEUTROABS 19.6*  --   --   --   HGB 9.6* 8.9* 8.8* 8.9*  HCT 31.3* 28.4* 27.5* 28.8*  MCV 96.0 95.0 94.2 94.4  PLT 250 238 211 284    Cardiac Enzymes: No results for input(s): "CKTOTAL", "CKMB", "CKMBINDEX", "TROPONINI" in the last 168 hours.  BNP: Invalid input(s): "POCBNP"  CBG: Recent Labs  Lab 10/28/21 1703 10/28/21 2034 10/28/21 2151 10/29/21 0741 10/29/21 1219  GLUCAP 113* 93 102* 82 190*    Microbiology: Results for orders placed or performed during the hospital encounter of 09/30/21  Resp Panel by RT-PCR (Flu A&B, Covid) Nasopharyngeal Swab     Status: None   Collection Time: 09/30/21  9:34 AM   Specimen: Nasopharyngeal Swab; Nasopharyngeal(NP) swabs in vial transport medium  Result Value Ref Range Status   SARS Coronavirus 2 by RT PCR NEGATIVE NEGATIVE Final    Comment: (NOTE)  SARS-CoV-2 target nucleic acids are NOT DETECTED.  The SARS-CoV-2 RNA is generally detectable in upper respiratory specimens during the acute phase of infection. The lowest concentration of SARS-CoV-2 viral copies this assay can detect is 138 copies/mL. A negative result does not preclude SARS-Cov-2 infection and should not be used as the sole basis for treatment or other patient management decisions. A negative result may occur with  improper specimen collection/handling, submission of specimen other than nasopharyngeal swab, presence of viral mutation(s) within the areas targeted by this assay, and inadequate number of viral copies(<138 copies/mL). A negative result must be combined with clinical observations, patient history, and epidemiological information. The expected result is Negative.  Fact Sheet for Patients:  EntrepreneurPulse.com.au  Fact  Sheet for Healthcare Providers:  IncredibleEmployment.be  This test is no t yet approved or cleared by the [place] FDA and  has been authorized for detection and/or diagnosis of SARS-CoV-2 by FDA under an Emergency Use Authorization (EUA). This EUA will remain  in effect (meaning this test can be used) for the duration of the COVID-19 declaration under Section 564(b)(1) of the Act, 21 U.S.C.section 360bbb-3(b)(1), unless the authorization is terminated  or revoked sooner.       Influenza A by PCR NEGATIVE NEGATIVE Final   Influenza B by PCR NEGATIVE NEGATIVE Final    Comment: (NOTE) The Xpert Xpress SARS-CoV-2/FLU/RSV plus assay is intended as an aid in the diagnosis of influenza from Nasopharyngeal swab specimens and should not be used as a sole basis for treatment. Nasal washings and aspirates are unacceptable for Xpert Xpress SARS-CoV-2/FLU/RSV testing.  Fact Sheet for Patients: EntrepreneurPulse.com.au  Fact Sheet for Healthcare Providers: IncredibleEmployment.be  This test is not yet approved or cleared by the [place] FDA and has been authorized for detection and/or diagnosis of SARS-CoV-2 by FDA under an Emergency Use Authorization (EUA). This EUA will remain in effect (meaning this test can be used) for the duration of the COVID-19 declaration under Section 564(b)(1) of the Act, 21 U.S.C. section 360bbb-3(b)(1), unless the authorization is terminated or revoked.  Performed at [facility], [address] [place], [place] [address]   SARS Coronavirus 2 by RT PCR (hospital order, performed in [facility] hospital lab) *cepheid single result test* Anterior Nasal Swab     Status: None   Collection Time: 10/23/21  4:06 PM   Specimen: Anterior Nasal Swab  Result Value Ref Range Status   SARS Coronavirus 2 by RT PCR NEGATIVE NEGATIVE Final    Comment: (NOTE) SARS-CoV-2 target nucleic acids are NOT  DETECTED.  The SARS-CoV-2 RNA is generally detectable in upper and lower respiratory specimens during the acute phase of infection. The lowest concentration of SARS-CoV-2 viral copies this assay can detect is 250 copies / mL. A negative result does not preclude SARS-CoV-2 infection and should not be used as the sole basis for treatment or other patient management decisions.  A negative result may occur with improper specimen collection / handling, submission of specimen other than nasopharyngeal swab, presence of viral mutation(s) within the areas targeted by this assay, and inadequate number of viral copies (<250 copies / mL). A negative result must be combined with clinical observations, patient history, and epidemiological information.  Fact Sheet for Patients:   https://www.patel.info/  Fact Sheet for Healthcare Providers: https://hall.com/  This test is not yet approved or  cleared by the [place] FDA and has been authorized for detection and/or diagnosis of SARS-CoV-2 by FDA under an Emergency Use Authorization (  EUA).  This EUA will remain in effect (meaning this test can be used) for the duration of the COVID-19 declaration under Section 564(b)(1) of the Act, 21 U.S.C. section 360bbb-3(b)(1), unless the authorization is terminated or revoked sooner.  Performed at [facility], [address] [place], [place] [address]   Blood culture (routine x 2)     Status: None   Collection Time: 10/23/21  4:06 PM   Specimen: BLOOD  Result Value Ref Range Status   Specimen Description BLOOD BLOOD RIGHT HAND  Final   Special Requests   Final    BOTTLES DRAWN AEROBIC AND ANAEROBIC Blood Culture adequate volume   Culture   Final    NO GROWTH 5 DAYS Performed at [facility], [address], [place], [place] [address]    Report Status 10/28/2021 FINAL  Final  Culture, blood (Routine X 2) w Reflex to ID Panel      Status: None   Collection Time: 10/24/21 12:06 AM   Specimen: BLOOD  Result Value Ref Range Status   Specimen Description BLOOD LEFT HAND  Final   Special Requests   Final    BOTTLES DRAWN AEROBIC AND ANAEROBIC Blood Culture adequate volume   Culture   Final    NO GROWTH 5 DAYS Performed at [facility], [address], [place], [place] [address]    Report Status 10/29/2021 FINAL  Final  MRSA Next Gen by PCR, Nasal     Status: None   Collection Time: 10/24/21  2:24 PM   Specimen: Nasal Mucosa; Nasal Swab  Result Value Ref Range Status   MRSA by PCR Next Gen NOT DETECTED NOT DETECTED Final    Comment: (NOTE) The GeneXpert MRSA Assay (FDA approved for NASAL specimens only), is one component of a comprehensive MRSA colonization surveillance program. It is not intended to diagnose MRSA infection nor to guide or monitor treatment for MRSA infections. Test performance is not FDA approved in patients less than 33 years old. Performed at [facility], [place], [place] [address]   Respiratory (~20 pathogens) panel by PCR     Status: None   Collection Time: 10/24/21  2:24 PM   Specimen: Nasopharyngeal Swab; Respiratory  Result Value Ref Range Status   Adenovirus NOT DETECTED NOT DETECTED Final   Coronavirus 229E NOT DETECTED NOT DETECTED Final    Comment: (NOTE) The Coronavirus on the Respiratory Panel, DOES NOT test for the novel  Coronavirus (2019 nCoV)    Coronavirus HKU1 NOT DETECTED NOT DETECTED Final   Coronavirus NL63 NOT DETECTED NOT DETECTED Final   Coronavirus OC43 NOT DETECTED NOT DETECTED Final   Metapneumovirus NOT DETECTED NOT DETECTED Final   Rhinovirus / Enterovirus NOT DETECTED NOT DETECTED Final   Influenza A NOT DETECTED NOT DETECTED Final   Influenza B NOT DETECTED NOT DETECTED Final   Parainfluenza Virus 1 NOT DETECTED NOT DETECTED Final   Parainfluenza Virus 2 NOT DETECTED NOT DETECTED Final   Parainfluenza Virus 3 NOT  DETECTED NOT DETECTED Final   Parainfluenza Virus 4 NOT DETECTED NOT DETECTED Final   Respiratory Syncytial Virus NOT DETECTED NOT DETECTED Final   Bordetella pertussis NOT DETECTED NOT DETECTED Final   Bordetella Parapertussis NOT DETECTED NOT DETECTED Final   Chlamydophila pneumoniae NOT DETECTED NOT DETECTED Final   Mycoplasma pneumoniae NOT DETECTED NOT DETECTED Final    Comment: Performed at [facility], [employer]. [address] [place], [place] [address]    Coagulation  Studies: No results for input(s): "LABPROT", "INR" in the last 72 hours.  Urinalysis: No results for input(s): "COLORURINE", "LABSPEC", "PHURINE", "GLUCOSEU", "HGBUR", "BILIRUBINUR", "KETONESUR", "PROTEINUR", "UROBILINOGEN", "NITRITE", "LEUKOCYTESUR" in the last 72 hours.  Invalid input(s): "APPERANCEUR"    Imaging: DG Abd 1 View  Result Date: 10/29/2021 CLINICAL DATA:  Fecal impaction with abdominal pain and distension. EXAM: ABDOMEN - 1 VIEW COMPARISON:  10/26/2021 FINDINGS: Examination demonstrates air throughout the colon with near complete clearance of previously seen moderate fecal retention. No dilated bowel loops. No air-fluid levels or free peritoneal air. Slight improvement left base opacification partially visualized central venous catheter over the SVC. Remainder of the exam is unchanged. IMPRESSION: Nonobstructive bowel gas pattern with near complete clearance of previously seen moderate fecal retention. Electronically Signed   By: Marin Olp M.D.   On: 10/29/2021 12:09     Medications:    ceFEPime (MAXIPIME) IV 2 g (10/28/21 1721)   promethazine (PHENERGAN) injection (IM or IVPB)      calcium acetate  667 mg Oral TID WC   carvedilol  12.5 mg Oral QPM   Chlorhexidine Gluconate Cloth  6 each Topical Q0600   epoetin (EPOGEN/PROCRIT) injection  4,000 Units Intravenous Q T,Th,Sa-HD   feeding supplement (NEPRO CARB STEADY)  237 mL Oral TID BM   heparin  5,000 Units Subcutaneous Q12H   insulin aspart   0-15 Units Subcutaneous TID WC   lactulose  20 g Oral BID   lubiprostone  24 mcg Oral BID WC   metoCLOPramide (REGLAN) injection  10 mg Intravenous Once   multivitamin  1 tablet Oral QHS   pantoprazole  40 mg Oral BID   scopolamine  1 patch Transdermal Q72H   sodium chloride flush  3 mL Intravenous Q12H   acetaminophen **OR** acetaminophen, guaiFENesin, ondansetron (ZOFRAN) IV, oxyCODONE-acetaminophen, tiZANidine, topiramate  Assessment/ Plan:  [name] is a 66 y.o.  female with past medical history of ESRD with hemodialysis TTS, diabetes mellitus, hypertension to the hospital seeking long term placement. She was recently admitted and received antibiotics for a dog bite.  She was discharged with a prescription for home antibiotics.  She states her caregivers were unable to retrieve antibiotics.  She states that there was an argument and that she is here seeking long-term placement.  Principal Problem:   Sepsis ([place]) Active Problems:   Hypertension   Type 2 diabetes mellitus ([place])   ESRD (end stage renal disease) ([place])   Financial difficulty   Anemia in chronic kidney disease   Cellulitis   Bent bone fracture of ulna   Hospital acquired PNA   Fecal impaction ([place])   [facility]/TTS/right PermCath  End Stage Renal Disease on hemodialysis:   Currently on TTS schedule. Did not get dialyzed in chair on Saturday due to not feeling well.  We will try again next week.Next Tx scheduled for Tuesday  No results found for: "POTASSIUM"  Intake/Output Summary (Last 24 hours) at 10/29/2021 1301 Last data filed at 10/29/2021 0900 Gross per 24 hour  Intake 240 ml  Output 1 ml  Net 239 ml     2. Hypertension with chronic kidney disease:   Blood pressure low normal.  Amlodipine discontinued. Now on carvedilol every evening   BP (!) 115/58 (BP Location: Right Arm)   Pulse 82   Temp 98.6 F (37 C) (Oral)   Resp 19   Ht 5\' 6"  (1.676 m)   Wt 79.7 kg   SpO2 96%    BMI  28.36 kg/m   3. Anemia of chronic kidney disease/ kidney injury/chronic disease/acute blood loss:   Lab Results  Component Value Date   HGB 8.9 (L) 10/29/2021   Continue low-dose EPO with dialysis treatments.  4. Secondary Hyperparathyroidism:     Lab Results  Component Value Date   PTH 70 (H) 05/12/2020   CALCIUM 8.8 (L) 10/29/2021   PHOS 3.0 10/24/2021   We will continue to monitor bone minerals during this admission.  Continue calcium acetate with meals  5.  Severe constipation Bowel regimen of Dulcolax, lactulose, Nulytely Large BM on 6/18    LOS: [facility] 6/18/20231:01 PM

## 2021-10-30 DIAGNOSIS — K5641 Fecal impaction: Secondary | ICD-10-CM | POA: Diagnosis not present

## 2021-10-30 DIAGNOSIS — A419 Sepsis, unspecified organism: Secondary | ICD-10-CM | POA: Diagnosis not present

## 2021-10-30 DIAGNOSIS — J189 Pneumonia, unspecified organism: Secondary | ICD-10-CM | POA: Diagnosis not present

## 2021-10-30 DIAGNOSIS — N186 End stage renal disease: Secondary | ICD-10-CM | POA: Diagnosis not present

## 2021-10-30 LAB — GLUCOSE, CAPILLARY
Glucose-Capillary: 80 mg/dL (ref 70–99)
Glucose-Capillary: 125 mg/dL — ABNORMAL HIGH (ref 70–99)
Glucose-Capillary: 122 mg/dL — ABNORMAL HIGH (ref 70–99)
Glucose-Capillary: 128 mg/dL — ABNORMAL HIGH (ref 70–99)

## 2021-10-30 NOTE — Progress Notes (Signed)
[facility]  Dialysis Note   Subjective:   Patient seen resting quietly Alert and oriented Tolerating meals Reports good bowel movement this weekend   Objective:  Vital signs in last 24 hours:  Temp:  [98.1 F (36.7 C)-98.5 F (36.9 C)] 98.2 F (36.8 C) (06/19 0740) Pulse Rate:  [64-69] 68 (06/19 0740) Resp:  [16-18] 18 (06/19 0740) BP: (113-132)/(50-62) 131/62 (06/19 0740) SpO2:  [95 %-96 %] 95 % (06/19 0740) Weight:  [79.7 kg] 79.7 kg (06/19 0500)  Weight change: -5.1 kg Filed Weights   10/28/21 1237 10/29/21 0500 10/30/21 0500  Weight: 84.2 kg 79.7 kg 79.7 kg    Intake/Output: I/O last 3 completed shifts: In: 720 [P.O.:720] Out: -    Intake/Output this shift:  No intake/output data recorded.  Physical Exam: General: NAD  Head: Normocephalic, atraumatic. Moist oral mucosal membranes  Eyes: Anicteric  Lungs:  Clear to auscultation, normal effort, congested cough  Heart: Regular rate and rhythm  Abdomen:  Soft, nontender  Extremities:  1+ peripheral edema.  Neurologic: Nonfocal, moving all four extremities  Skin: No lesions, left hand ring finger edema  Access: Right PermCath    Basic Metabolic Panel: Recent Labs  Lab 10/23/21 1605 10/23/21 2338 10/24/21 0956 10/25/21 0437 10/28/21 1005 10/29/21 0452  NA 133*  --  134* 135 136 138  K 4.2  --  4.0 3.3* 3.5 3.8  CL 98  --  97* 100 99 103  CO2 30  --  25 29 28 27   GLUCOSE 103*  --  129* 118* 90 83  BUN 31*  --  36* 20 23 16   CREATININE 5.17* 5.41* 5.56* 3.46* 4.34* 3.40*  CALCIUM 8.6*  --  8.4* 8.3* 8.6* 8.8*  MG  --   --   --   --   --  2.1  PHOS  --   --  3.0  --   --   --      Liver Function Tests: Recent Labs  Lab 10/23/21 1605 10/24/21 0956 10/28/21 1005  AST 15 15 23   ALT 14 13 22   ALKPHOS 101 96 102  BILITOT 0.7 0.8 0.7  PROT 6.7 6.3* 6.1*  ALBUMIN 2.7* 2.5* 2.3*    No results for input(s): "LIPASE", "AMYLASE" in the last 168 hours. No results for input(s):  "AMMONIA" in the last 168 hours.  CBC: Recent Labs  Lab 10/23/21 1605 10/24/21 0956 10/25/21 0437 10/29/21 0452  WBC 22.9* 18.2* 11.7* 9.9  NEUTROABS 19.6*  --   --   --   HGB 9.6* 8.9* 8.8* 8.9*  HCT 31.3* 28.4* 27.5* 28.8*  MCV 96.0 95.0 94.2 94.4  PLT 250 238 211 284     Cardiac Enzymes: No results for input(s): "CKTOTAL", "CKMB", "CKMBINDEX", "TROPONINI" in the last 168 hours.  BNP: Invalid input(s): "POCBNP"  CBG: Recent Labs  Lab 10/29/21 0741 10/29/21 1219 10/29/21 1720 10/29/21 2054 10/30/21 0804  GLUCAP 82 190* 137* 109* 80     Microbiology: Results for orders placed or performed during the hospital encounter of 09/30/21  Resp Panel by RT-PCR (Flu A&B, Covid) Nasopharyngeal Swab     Status: None   Collection Time: 09/30/21  9:34 AM   Specimen: Nasopharyngeal Swab; Nasopharyngeal(NP) swabs in vial transport medium  Result Value Ref Range Status   SARS Coronavirus 2 by RT PCR NEGATIVE NEGATIVE Final    Comment: (NOTE) SARS-CoV-2 target nucleic acids are NOT DETECTED.  The SARS-CoV-2 RNA is generally detectable in upper respiratory  specimens during the acute phase of infection. The lowest concentration of SARS-CoV-2 viral copies this assay can detect is 138 copies/mL. A negative result does not preclude SARS-Cov-2 infection and should not be used as the sole basis for treatment or other patient management decisions. A negative result may occur with  improper specimen collection/handling, submission of specimen other than nasopharyngeal swab, presence of viral mutation(s) within the areas targeted by this assay, and inadequate number of viral copies(<138 copies/mL). A negative result must be combined with clinical observations, patient history, and epidemiological information. The expected result is Negative.  Fact Sheet for Patients:  EntrepreneurPulse.com.au  Fact Sheet for Healthcare Providers:   IncredibleEmployment.be  This test is no t yet approved or cleared by the [place] FDA and  has been authorized for detection and/or diagnosis of SARS-CoV-2 by FDA under an Emergency Use Authorization (EUA). This EUA will remain  in effect (meaning this test can be used) for the duration of the COVID-19 declaration under Section 564(b)(1) of the Act, 21 U.S.C.section 360bbb-3(b)(1), unless the authorization is terminated  or revoked sooner.       Influenza A by PCR NEGATIVE NEGATIVE Final   Influenza B by PCR NEGATIVE NEGATIVE Final    Comment: (NOTE) The Xpert Xpress SARS-CoV-2/FLU/RSV plus assay is intended as an aid in the diagnosis of influenza from Nasopharyngeal swab specimens and should not be used as a sole basis for treatment. Nasal washings and aspirates are unacceptable for Xpert Xpress SARS-CoV-2/FLU/RSV testing.  Fact Sheet for Patients: EntrepreneurPulse.com.au  Fact Sheet for Healthcare Providers: IncredibleEmployment.be  This test is not yet approved or cleared by the [place] FDA and has been authorized for detection and/or diagnosis of SARS-CoV-2 by FDA under an Emergency Use Authorization (EUA). This EUA will remain in effect (meaning this test can be used) for the duration of the COVID-19 declaration under Section 564(b)(1) of the Act, 21 U.S.C. section 360bbb-3(b)(1), unless the authorization is terminated or revoked.  Performed at [facility], [address] [place], [place] [address]   SARS Coronavirus 2 by RT PCR (hospital order, performed in [facility] hospital lab) *cepheid single result test* Anterior Nasal Swab     Status: None   Collection Time: 10/23/21  4:06 PM   Specimen: Anterior Nasal Swab  Result Value Ref Range Status   SARS Coronavirus 2 by RT PCR NEGATIVE NEGATIVE Final    Comment: (NOTE) SARS-CoV-2 target nucleic acids are NOT DETECTED.  The SARS-CoV-2 RNA  is generally detectable in upper and lower respiratory specimens during the acute phase of infection. The lowest concentration of SARS-CoV-2 viral copies this assay can detect is 250 copies / mL. A negative result does not preclude SARS-CoV-2 infection and should not be used as the sole basis for treatment or other patient management decisions.  A negative result may occur with improper specimen collection / handling, submission of specimen other than nasopharyngeal swab, presence of viral mutation(s) within the areas targeted by this assay, and inadequate number of viral copies (<250 copies / mL). A negative result must be combined with clinical observations, patient history, and epidemiological information.  Fact Sheet for Patients:   https://www.patel.info/  Fact Sheet for Healthcare Providers: https://hall.com/  This test is not yet approved or  cleared by the [place] FDA and has been authorized for detection and/or diagnosis of SARS-CoV-2 by FDA under an Emergency Use Authorization (EUA).  This EUA will remain in effect (meaning this test can be used) for the duration  of the COVID-19 declaration under Section 564(b)(1) of the Act, 21 U.S.C. section 360bbb-3(b)(1), unless the authorization is terminated or revoked sooner.  Performed at [facility], [address] [place], [place] [address]   Blood culture (routine x 2)     Status: None   Collection Time: 10/23/21  4:06 PM   Specimen: BLOOD  Result Value Ref Range Status   Specimen Description BLOOD BLOOD RIGHT HAND  Final   Special Requests   Final    BOTTLES DRAWN AEROBIC AND ANAEROBIC Blood Culture adequate volume   Culture   Final    NO GROWTH 5 DAYS Performed at [facility], [address], [place], [place] [address]    Report Status 10/28/2021 FINAL  Final  Culture, blood (Routine X 2) w Reflex to ID Panel     Status: None   Collection Time:  10/24/21 12:06 AM   Specimen: BLOOD  Result Value Ref Range Status   Specimen Description BLOOD LEFT HAND  Final   Special Requests   Final    BOTTLES DRAWN AEROBIC AND ANAEROBIC Blood Culture adequate volume   Culture   Final    NO GROWTH 5 DAYS Performed at [facility], [address], [place], [place] [address]    Report Status 10/29/2021 FINAL  Final  MRSA Next Gen by PCR, Nasal     Status: None   Collection Time: 10/24/21  2:24 PM   Specimen: Nasal Mucosa; Nasal Swab  Result Value Ref Range Status   MRSA by PCR Next Gen NOT DETECTED NOT DETECTED Final    Comment: (NOTE) The GeneXpert MRSA Assay (FDA approved for NASAL specimens only), is one component of a comprehensive MRSA colonization surveillance program. It is not intended to diagnose MRSA infection nor to guide or monitor treatment for MRSA infections. Test performance is not FDA approved in patients less than 26 years old. Performed at [facility], [place], [place] [address]   Respiratory (~20 pathogens) panel by PCR     Status: None   Collection Time: 10/24/21  2:24 PM   Specimen: Nasopharyngeal Swab; Respiratory  Result Value Ref Range Status   Adenovirus NOT DETECTED NOT DETECTED Final   Coronavirus 229E NOT DETECTED NOT DETECTED Final    Comment: (NOTE) The Coronavirus on the Respiratory Panel, DOES NOT test for the novel  Coronavirus (2019 nCoV)    Coronavirus HKU1 NOT DETECTED NOT DETECTED Final   Coronavirus NL63 NOT DETECTED NOT DETECTED Final   Coronavirus OC43 NOT DETECTED NOT DETECTED Final   Metapneumovirus NOT DETECTED NOT DETECTED Final   Rhinovirus / Enterovirus NOT DETECTED NOT DETECTED Final   Influenza A NOT DETECTED NOT DETECTED Final   Influenza B NOT DETECTED NOT DETECTED Final   Parainfluenza Virus 1 NOT DETECTED NOT DETECTED Final   Parainfluenza Virus 2 NOT DETECTED NOT DETECTED Final   Parainfluenza Virus 3 NOT DETECTED NOT DETECTED Final    Parainfluenza Virus 4 NOT DETECTED NOT DETECTED Final   Respiratory Syncytial Virus NOT DETECTED NOT DETECTED Final   Bordetella pertussis NOT DETECTED NOT DETECTED Final   Bordetella Parapertussis NOT DETECTED NOT DETECTED Final   Chlamydophila pneumoniae NOT DETECTED NOT DETECTED Final   Mycoplasma pneumoniae NOT DETECTED NOT DETECTED Final    Comment: Performed at [facility], [place]. [address], [place], [place] [address]    Coagulation Studies: No results for input(s): "LABPROT", "INR" in the last 72 hours.  Urinalysis: No results for  input(s): "COLORURINE", "LABSPEC", "PHURINE", "GLUCOSEU", "HGBUR", "BILIRUBINUR", "KETONESUR", "PROTEINUR", "UROBILINOGEN", "NITRITE", "LEUKOCYTESUR" in the last 72 hours.  Invalid input(s): "APPERANCEUR"    Imaging: DG Abd 1 View  Result Date: 10/29/2021 CLINICAL DATA:  Fecal impaction with abdominal pain and distension. EXAM: ABDOMEN - 1 VIEW COMPARISON:  10/26/2021 FINDINGS: Examination demonstrates air throughout the colon with near complete clearance of previously seen moderate fecal retention. No dilated bowel loops. No air-fluid levels or free peritoneal air. Slight improvement left base opacification partially visualized central venous catheter over the SVC. Remainder of the exam is unchanged. IMPRESSION: Nonobstructive bowel gas pattern with near complete clearance of previously seen moderate fecal retention. Electronically Signed   By: Marin Olp M.D.   On: 10/29/2021 12:09     Medications:    promethazine (PHENERGAN) injection (IM or IVPB)      calcium acetate  667 mg Oral TID WC   carvedilol  12.5 mg Oral QPM   Chlorhexidine Gluconate Cloth  6 each Topical Q0600   epoetin (EPOGEN/PROCRIT) injection  4,000 Units Intravenous Q T,Th,Sa-HD   feeding supplement (NEPRO CARB STEADY)  237 mL Oral TID BM   heparin  5,000 Units Subcutaneous Q12H   insulin aspart  0-15 Units Subcutaneous TID WC   lactulose  20 g Oral BID   lubiprostone   24 mcg Oral BID WC   metoCLOPramide (REGLAN) injection  10 mg Intravenous Once   multivitamin  1 tablet Oral QHS   pantoprazole  40 mg Oral BID   scopolamine  1 patch Transdermal Q72H   sodium chloride flush  3 mL Intravenous Q12H   acetaminophen **OR** acetaminophen, guaiFENesin, ondansetron (ZOFRAN) IV, oxyCODONE-acetaminophen, tiZANidine, topiramate  Assessment/ Plan:  [name] is a 66 y.o.  female with past medical history of ESRD with hemodialysis TTS, diabetes mellitus, hypertension to the hospital seeking long term placement. She was recently admitted and received antibiotics for a dog bite.  She was discharged with a prescription for home antibiotics.  She states her caregivers were unable to retrieve antibiotics.  She states that there was an argument and that she is here seeking long-term placement.  Principal Problem:   Sepsis ([place]) Active Problems:   Hypertension   Type 2 diabetes mellitus ([place])   ESRD (end stage renal disease) ([place])   Financial difficulty   Anemia in chronic kidney disease   Cellulitis   Bent bone fracture of ulna   Hospital acquired PNA   Fecal impaction ([place])   [facility]/TTS/right PermCath  End Stage Renal Disease on hemodialysis:  Currently on TTS schedule. Next Tx scheduled for Tuesday seated in chair  No results found for: "POTASSIUM"  Intake/Output Summary (Last 24 hours) at 10/30/2021 1005 Last data filed at 10/29/2021 1900 Gross per 24 hour  Intake 480 ml  Output --  Net 480 ml      2. Hypertension with chronic kidney disease:   Amlodipine stopped, remains on carvedilol every evening Blood pressure 131/62   BP 131/62 (BP Location: Right Arm)   Pulse 68   Temp 98.2 F (36.8 C) (Oral)   Resp 18   Ht 5\' 6"  (1.676 m)   Wt 79.7 kg   SpO2 95%   BMI 28.36 kg/m   3. Anemia of chronic kidney disease/ kidney injury/chronic disease/acute blood loss:   Lab Results  Component Value Date   HGB 8.9 (L) 10/29/2021    Continue low-dose EPO with dialysis treatments.  4. Secondary Hyperparathyroidism:     Lab  Results  Component Value Date   PTH 70 (H) 05/12/2020   CALCIUM 8.8 (L) 10/29/2021   PHOS 3.0 10/24/2021   Calcium and phosphorus at goal. Continue calcium acetate with meals  5.  Severe constipation Bowel regimen of Dulcolax, lactulose, Nulytely Large BM on 6/18    LOS: [facility] 6/19/202310:05 AM

## 2021-10-30 NOTE — Progress Notes (Signed)
PT Cancellation Note  Patient Details Name: Katie [name] MRN: [id number] DOB: 11/04/1955   Cancelled Treatment:    Reason Eval/Treat Not Completed: Pain limiting ability to participate;Other (comment). Pt sitting in recliner. She states that she does not feel well, is tired and in pain (headache and left arm). PT listened and encouraged gentle exercise. Pt declined stating she already worked with OT and asked PT to return next date. Will re-attempt at later date as pt is available and willing.    Patrina Levering PT, DPT

## 2021-10-30 NOTE — TOC Progression Note (Signed)
Transition of Care [facility]) - Progression Note    Patient Details  Name: Katie [name] MRN: [id number] Date of Birth: 06/23/1955  Transition of Care [facility] CM/SW Contact  Anselm Pancoast, RN Phone Number: 10/30/2021, 12:21 PM  Clinical Narrative:    Continue seeking SNF placement with eventual LTC needs. Patient also needs to remain in [facility] facility.   LVMM for [facility] for potential placement.   LVMM for [facility] for potential placement.   Reviewed case with Lavone Orn for possible placement at [facility] facility. [place] agreed to review. Sent requested information and updated Fl2 to secure email.    Expected Discharge Plan: Skilled Nursing Facility Barriers to Discharge: SNF Pending bed offer  Expected Discharge Plan and Services Expected Discharge Plan: [place]   Discharge Planning Services: CM Consult Post Acute Care Choice: [place] Living arrangements for the past 2 months: Single Family Home                 DME Arranged: N/A DME Agency: NA       HH Arranged: NA HH Agency: NA         Social Determinants of Health (SDOH) Interventions    Readmission Risk Interventions    09/27/2021    1:44 PM 09/26/2021   12:46 PM 03/08/2021    2:49 PM  Readmission Risk Prevention Plan  Transportation Screening  Complete Complete  Palliative Care Screening Not Applicable    Medication Review (RN Care Manager)  Complete Complete  HRI or Home Care Consult   Complete  SW Recovery Care/Counseling Consult  Complete   Palliative Care Screening  Not Applicable   [place]  Not Applicable Complete

## 2021-10-30 NOTE — Evaluation (Signed)
Occupational Therapy Re-Evaluation Patient Details Name: Katie [name] MRN: [id number] DOB: [birth year] Today's Date: 10/30/2021   History of Present Illness Pt is a 66 y.o. female presenting to hospital 5/20 with concerns for worsening L hand pain d/t animal bite (pt recently admitted 5/13-5/17 for same reason). Pt's friend dropped her off at dialysis and refused to do any more (pt now homeless).  PMH includes ESRD on HD TTS, DM, htn, orthostatic hypotension, and h/o mandible surgery.   Clinical Impression   Pt seen for OT re-evaluation on this date. Upon arrival to room pt awake and alert seated upright in bed. Pt reported generalized pain in LUE and head. Pt requested to go to the bathroom. Pt required MOD I for supine<>sit - vitals checked at EOB (BP: 111/55, SpO2: 86% on room air) and MIN A for STS, progressing to CGA/HHA for bed>chair and BSC t/f. MIN A with min vcs for safe hand placement for BSC t/f. MAX A + RW for pericare in standing. MAX A + RW for lower body dressing - needs assistance threading underpants while seated, but able pull underpants into place in standing. Pt required prolonged seated rest breaks throughout due to fatigue. Pt making good progress toward goals. Pt continues to benefit from skilled OT services to maximize return to PLOF and minimize risk of future falls, injury. Will continue to follow POC. Discharge recommendation updated to SNF due to increased assistance to perform meaningful occupations.        Recommendations for follow up therapy are one component of a multi-disciplinary discharge planning process, led by the attending physician.  Recommendations may be updated based on patient status, additional functional criteria and insurance authorization.   Follow Up Recommendations  Skilled nursing-short term rehab (<3 hours/day)    Assistance Recommended at Discharge Intermittent Supervision/Assistance  Patient can return home with the following A little help with  walking and/or transfers;A lot of help with bathing/dressing/bathroom;Assistance with cooking/housework;Assist for transportation    Functional Status Assessment     Equipment Recommendations  BSC/3in1    Recommendations for Other Services       Precautions / Restrictions Precautions Precautions: Fall Restrictions Weight Bearing Restrictions: No      Mobility Bed Mobility Overal bed mobility: Modified Independent Bed Mobility: Supine to Sit, Sit to Supine     Supine to sit: HOB elevated, Modified independent (Device/Increase time) Sit to supine: Modified independent (Device/Increase time), HOB elevated   General bed mobility comments: Pt able to perform bed mobility with extra time/effort and assistance from bedrailings.    Transfers Overall transfer level: Needs assistance Equipment used: Rolling walker (2 wheels), 1 person hand held assist Transfers: Sit to/from Stand Sit to Stand: Min assist     Step pivot transfers: Min guard            Balance Overall balance assessment: Needs assistance Sitting-balance support: No upper extremity supported, Feet supported Sitting balance-Leahy Scale: Good     Standing balance support: Bilateral upper extremity supported, During functional activity, Reliant on assistive device for balance Standing balance-Leahy Scale: Fair                             ADL either performed or assessed with clinical judgement   ADL Overall ADL's : Needs assistance/impaired  General ADL Comments: MIN A with min vcs for safe hand placement for BSC t/f. MAX A + RW for pericare in standing. MAX A + RW for lower body dressing - needs assistance threading underpants while seated, but able pull underpants into place in standing.      Pertinent Vitals/Pain Pain Assessment Pain Assessment: Faces Faces Pain Scale: Hurts a little bit Pain Location: Pt reported pain in LUE and  head Pain Descriptors / Indicators: Grimacing, Discomfort, Aching Pain Intervention(s): Limited activity within patient's tolerance, Monitored during session, Repositioned     Hand Dominance     Extremity/Trunk Assessment Upper Extremity Assessment Upper Extremity Assessment: Overall WFL for tasks assessed;Generalized weakness           Communication     Cognition Arousal/Alertness: Awake/alert Behavior During Therapy: WFL for tasks assessed/performed Overall Cognitive Status: Within Functional Limits for tasks assessed                                            OT Treatment/Interventions:      OT Goals(Current goals can be found in the care plan section) Acute Rehab OT Goals Patient Stated Goal: to improve pain OT Goal Formulation: With patient Time For Goal Achievement: 11/13/21 Potential to Achieve Goals: Good ADL Goals Pt Will Perform Eating: with supervision;sitting;with adaptive utensils Pt Will Perform Grooming: with supervision;sitting Pt Will Transfer to Toilet: ambulating;regular height toilet;with supervision Pt Will Perform Toileting - Clothing Manipulation and hygiene: with supervision;sit to/from stand Pt Will Perform Tub/Shower Transfer: with supervision Pt/caregiver will Perform Home Exercise Program: Left upper extremity;With written HEP provided  OT Frequency: Min 2X/week       AM-PAC OT "6 Clicks" Daily Activity     Outcome Measure Help from another person eating meals?: A Little Help from another person taking care of personal grooming?: A Little Help from another person toileting, which includes using toliet, bedpan, or urinal?: A Lot Help from another person bathing (including washing, rinsing, drying)?: A Lot Help from another person to put on and taking off regular upper body clothing?: None Help from another person to put on and taking off regular lower body clothing?: A Lot 6 Click Score: 16   End of Session Equipment  Utilized During Treatment: Oxygen;Rolling walker (2 wheels)  Activity Tolerance: Patient tolerated treatment well;Patient limited by fatigue;Patient limited by pain Patient left: in chair;with call bell/phone within reach;with chair alarm set  OT Visit Diagnosis: Unsteadiness on feet (R26.81)                Time: 5188-4166 OT Time Calculation (min): 27 min Charges:  OT General Charges $OT Visit: 1 Visit OT Evaluation $OT Re-eval: 1 Re-eval OT Treatments $Self Care/Home Management : 8-22 mins $Therapeutic Activity: 8-22 mins  [employer] OTDS  [employer] 10/30/2021, 4:21 PM

## 2021-10-30 NOTE — NC FL2 (Signed)
[place] LEVEL OF CARE SCREENING TOOL     IDENTIFICATION  Patient Name: Katie [name] Birthdate: 09-03-1955 Sex: female Admission Date (Current Location): 09/30/2021  [place] and [place] Number:  Engineering geologist and Address:  [facility], [address] [place], [place] [address]      Provider Number: [id number]  Attending Physician Name and Address:  Sharen Hones, MD  Relative Name and Phone Number:       Current Level of Care: Hospital Recommended Level of Care: [place] Prior Approval Number:    Date Approved/Denied:   PASRR Number: [id number]  Discharge Plan: SNF    Current Diagnoses: Patient Active Problem List   Diagnosis Date Noted   Fecal impaction ([place]) 10/27/2021   Bent bone fracture of ulna 10/24/2021   Hospital acquired PNA 10/24/2021   Sepsis ([place]) 10/23/2021   Cellulitis 10/23/2021   Right flank pain 09/23/2021   Iron deficiency anemia, unspecified 03/27/2021   Allergy, unspecified, initial encounter 03/22/2021   Anaphylactic shock, unspecified, initial encounter 03/22/2021   Unspecified jaundice 03/15/2021   Allergy status to penicillin 03/14/2021   Anemia in chronic kidney disease 03/14/2021   Chronic diastolic (congestive) heart failure ([place]) 03/14/2021   Dependence on renal dialysis ([place]) 03/14/2021   Depression, unspecified 03/14/2021   Hypertensive heart and chronic kidney disease with heart failure and with stage 5 chronic kidney disease, or end stage renal disease ([place]) 03/14/2021   Personal history of nicotine dependence 03/14/2021   Secondary hyperparathyroidism of renal origin ([place]) 03/14/2021   Frequent falls 03/07/2021   Homelessness 03/07/2021   Financial difficulty 03/07/2021   ESRD (end stage renal disease) ([place]) 12/23/2020   Persistent headaches 06/27/2020   ATN (acute tubular necrosis) ([place])    Pulmonary hypertension, unspecified (HCC)    Chronic kidney disease  (CKD), stage IV (severe) (HCC)    Pleural effusion    Acute heart failure with preserved ejection fraction (HFpEF) ([place])    Pressure injury of skin 05/01/2020   Hyperkalemia    Volume overload 04/30/2020   Hypertension    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HCC)    Type 2 diabetes mellitus ([place])     Orientation RESPIRATION BLADDER Height & Weight     Self, Time, Situation, Place  Normal Continent Weight: 79.7 kg Height:  5\' 6"  (167.6 cm)  BEHAVIORAL SYMPTOMS/MOOD NEUROLOGICAL BOWEL NUTRITION STATUS      Continent Diet (Regular)  AMBULATORY STATUS COMMUNICATION OF NEEDS Skin   Limited Assist Verbally Normal                       Personal Care Assistance Level of Assistance  Bathing, Feeding, Dressing Bathing Assistance: Limited assistance Feeding assistance: Limited assistance Dressing Assistance: Limited assistance     Functional Limitations Info  Sight Sight Info: Impaired Hearing Info: Adequate Speech Info: Adequate    SPECIAL CARE FACTORS FREQUENCY  PT (By licensed PT), OT (By licensed OT)     PT Frequency: Min 5xweek OT Frequency: min 5xweek            Contractures Contractures Info: Not present    Additional Factors Info  Code Status (Full) Code Status Info: Full Allergies Info: Penicillins           Current Medications (10/30/2021):  This is the current hospital active medication list Current Facility-Administered Medications  Medication Dose Route Frequency Provider Last Rate Last Admin   acetaminophen (TYLENOL) tablet 650  mg  650 mg Oral Q6H PRN Para Skeans, MD   650 mg at 10/24/21 1728   Or   acetaminophen (TYLENOL) suppository 650 mg  650 mg Rectal Q6H PRN Para Skeans, MD       calcium acetate (PHOSLO) capsule 667 mg  667 mg Oral TID WC Para Skeans, MD   667 mg at 10/30/21 0811   carvedilol (COREG) tablet 12.5 mg  12.5 mg Oral QPM Florina Ou V, MD   12.5 mg at 10/29/21 1741   Chlorhexidine Gluconate Cloth 2 % PADS 6 each   6 each Topical Q0600 Para Skeans, MD   6 each at 10/30/21 0823   epoetin alfa (EPOGEN) injection 4,000 Units  4,000 Units Intravenous Q T,Th,Sa-HD Breeze, Benancio Deeds, NP   4,000 Units at 10/28/21 1216   feeding supplement (NEPRO CARB STEADY) liquid 237 mL  237 mL Oral TID BM Emeterio Reeve, DO   237 mL at 10/30/21 0940   guaiFENesin (ROBITUSSIN) 100 MG/5ML liquid 10 mL  10 mL Oral Q4H PRN Para Skeans, MD   10 mL at 10/21/21 2356   heparin injection 5,000 Units  5,000 Units Subcutaneous Q12H Florina Ou V, MD   5,000 Units at 10/30/21 0940   insulin aspart (novoLOG) injection 0-15 Units  0-15 Units Subcutaneous TID WC Para Skeans, MD   2 Units at 10/29/21 1802   lactulose ([place]) 10 GM/15ML solution 20 g  20 g Oral BID Efrain Sella, MD   20 g at 10/30/21 0941   lubiprostone (AMITIZA) capsule 24 mcg  24 mcg Oral BID WC Sharen Hones, MD   24 mcg at 10/30/21 0811   metoCLOPramide (REGLAN) injection 10 mg  10 mg Intravenous Once Para Skeans, MD       multivitamin (RENA-VIT) tablet 1 tablet  1 tablet Oral QHS Emeterio Reeve, DO   1 tablet at 10/30/21 0006   ondansetron (ZOFRAN) injection 4 mg  4 mg Intravenous Q6H PRN Murlean Iba, MD   4 mg at 10/29/21 1033   oxyCODONE-acetaminophen (PERCOCET/ROXICET) 5-325 MG per tablet 1 tablet  1 tablet Oral Q6H PRN Para Skeans, MD   1 tablet at 10/30/21 0811   pantoprazole (PROTONIX) EC tablet 40 mg  40 mg Oral BID Darnelle Bos, RPH   40 mg at 10/30/21 0940   promethazine (PHENERGAN) 6.25 mg in sodium chloride 0.9 % 50 mL IVPB  6.25 mg Intravenous Once Emeterio Reeve, DO       scopolamine (TRANSDERM-SCOP) 1 MG/3DAYS 1.5 mg  1 patch Transdermal Q72H Emeterio Reeve, DO   1.5 mg at 10/27/21 1726   sodium chloride flush (NS) 0.9 % injection 3 mL  3 mL Intravenous Q12H Florina Ou V, MD   3 mL at 10/30/21 0942   tiZANidine (ZANAFLEX) tablet 4 mg  4 mg Oral BID PRN Para Skeans, MD   4 mg at 10/27/21 1355   topiramate (TOPAMAX)  tablet 50 mg  50 mg Oral BID PRN Para Skeans, MD         Discharge Medications: Please see discharge summary for a list of discharge medications.  Relevant Imaging Results:  Relevant Lab Results:   Additional Information SS# [id number] [facility]  Anselm Pancoast, RN

## 2021-10-30 NOTE — Progress Notes (Signed)
  Progress Note   Patient: Katie [name] UDJ:[id number] DOB: 07/01/1955 DOA: 09/30/2021     7 DOS: the patient was seen and examined on 10/30/2021   Brief hospital course: Katie [name] is a 66 y.o. female with medical history significant of ESRD, DM II, homelessness. Had been admitted 05/13-05/17 for sepsis d/t cellulits from animal bite to arm, discharged on po abx and was unable to fill the Rx, was kicked out of where she was living, no way to get HD.  Patient has been in the ED since arriving the hospital.  She was admitted to the hospital again on 10/23/2021 as patient developed cough short of breath and fever.  Chest x-ray showed pneumonia.  Patient was diagnosed with sepsis and pneumonia, was started on cefepime and vancomycin. Had MRI performed on 6/14 did not show any osteomyelitis. Patient is currently homeless, has a severe weakness.  No discharge option at this time. 6/15.  Patient has no nausea, KUB showed fecal impaction with severe dilated colon, patient was started on senna as well as scheduled lactulose.  CT chest also showed dense right lower lobe consolidation consistent with aspiration pneumonia.  Shoulder x-ray does not show any osteomyelitis versus septic joint. Patient fecal impaction resolved after Nulytely on 6/17. Patient condition has stabilized, pending nursing home placement.  Assessment and Plan: Sepsis. Healthcare associate pneumonia. Recent dog bites with cellulitis. Condition improved, no additional treatment needed   Fecal impaction. Condition has resolved, patient now has daily bowel movement, continue lactulose.   End stage renal disease on dialysis. Anemia of chronic kidney disease. Hypokalemia. Followed by nephrology.  Type 2 diabetes. No change in treatment regimen  Essential hypertension. Continue current treatment      Subjective:  Patient doing well, she has complains of short of breath before HD, short of breath  getting better after HD.  Has  daily bowel movements without nausea vomiting.  Physical Exam: Vitals:   10/29/21 2054 10/30/21 0440 10/30/21 0500 10/30/21 0740  BP: (!) 113/50 132/61  131/62  Pulse: 64 69  68  Resp: 16 16  18   Temp: 98.5 F (36.9 C) 98.1 F (36.7 C)  98.2 F (36.8 C)  TempSrc:    Oral  SpO2: 96% 95%  95%  Weight:   79.7 kg   Height:       General exam: Appears calm and comfortable  Respiratory system: Crackles in the base. Respiratory effort normal. Cardiovascular system: S1 & S2 heard, RRR. No JVD, murmurs, rubs, gallops or clicks. No pedal edema. Gastrointestinal system: Abdomen is nondistended, soft and nontender. No organomegaly or masses felt. Normal bowel sounds heard. Central nervous system: Alert and oriented. No focal neurological deficits. Extremities: Symmetric 5 x 5 power. Skin: No rashes, lesions or ulcers Psychiatry: Judgement and insight appear normal. Mood & affect appropriate.   Data Reviewed:  No new results.  Family Communication: No family  Disposition: Status is: Inpatient Remains inpatient appropriate because: Unsafe discharge, pending nursing home placement  Planned Discharge Destination: Skilled nursing facility    Time spent: 35 minutes  Author: Sharen Hones, MD 10/30/2021 9:47 AM  For on call review [email or web address].

## 2021-10-31 DIAGNOSIS — Z794 Long term (current) use of insulin: Secondary | ICD-10-CM

## 2021-10-31 DIAGNOSIS — W540XXA Bitten by dog, initial encounter: Secondary | ICD-10-CM

## 2021-10-31 DIAGNOSIS — E1122 Type 2 diabetes mellitus with diabetic chronic kidney disease: Secondary | ICD-10-CM | POA: Diagnosis not present

## 2021-10-31 DIAGNOSIS — A419 Sepsis, unspecified organism: Secondary | ICD-10-CM | POA: Diagnosis not present

## 2021-10-31 DIAGNOSIS — N186 End stage renal disease: Secondary | ICD-10-CM | POA: Diagnosis not present

## 2021-10-31 DIAGNOSIS — J189 Pneumonia, unspecified organism: Secondary | ICD-10-CM | POA: Diagnosis not present

## 2021-10-31 DIAGNOSIS — R609 Edema, unspecified: Secondary | ICD-10-CM

## 2021-10-31 LAB — GLUCOSE, CAPILLARY
Glucose-Capillary: 95 mg/dL (ref 70–99)
Glucose-Capillary: 122 mg/dL — ABNORMAL HIGH (ref 70–99)
Glucose-Capillary: 134 mg/dL — ABNORMAL HIGH (ref 70–99)
Glucose-Capillary: 110 mg/dL — ABNORMAL HIGH (ref 70–99)

## 2021-10-31 MED ORDER — EPOETIN ALFA 4000 UNIT/ML IJ SOLN
INTRAMUSCULAR | Status: AC
  Filled 2021-10-31: qty 1

## 2021-10-31 MED ORDER — OXYCODONE-ACETAMINOPHEN 5-325 MG PO TABS
ORAL_TABLET | ORAL | Status: AC
  Filled 2021-10-31: qty 1

## 2021-10-31 MED ORDER — HEPARIN SODIUM (PORCINE) 1000 UNIT/ML IJ SOLN
INTRAMUSCULAR | Status: AC
  Filled 2021-10-31: qty 10

## 2021-10-31 NOTE — Progress Notes (Signed)
[facility]  Dialysis Note   Subjective:   Patient seen resting in bed prior to dialysis Patient agreed to dialyze in chair  Patient seen and evaluated during dialysis   HEMODIALYSIS FLOWSHEET:  Blood Flow Rate (mL/min): 400 mL/min Arterial Pressure (mmHg): -170 mmHg Venous Pressure (mmHg): 110 mmHg Transmembrane Pressure (mmHg): 60 mmHg Ultrafiltration Rate (mL/min): 660 mL/min Dialysate Flow Rate (mL/min): 500 ml/min Conductivity: 13.9 Conductivity: 13.9 Dialysis Fluid Bolus: Normal Saline Bolus Amount (mL): 250 mL  Patient seated in recliner, tolerating treatment well No complaints of pain or discomfort  Objective:  Vital signs in last 24 hours:  Temp:  [98.2 F (36.8 C)-98.6 F (37 C)] 98.5 F (36.9 C) (06/20 1322) Pulse Rate:  [66-83] 83 (06/20 1322) Resp:  [11-21] 17 (06/20 1322) BP: (94-148)/(48-90) 94/78 (06/20 1322) SpO2:  [94 %-100 %] 96 % (06/20 1322)  Weight change:  Filed Weights   10/28/21 1237 10/29/21 0500 10/30/21 0500  Weight: 84.2 kg 79.7 kg 79.7 kg    Intake/Output: I/O last 3 completed shifts: In: 480 [P.O.:480] Out: -    Intake/Output this shift:  Total I/O In: -  Out: 1500 [Other:1500]  Physical Exam: General: NAD  Head: Normocephalic, atraumatic. Moist oral mucosal membranes  Eyes: Anicteric  Lungs:  Clear to auscultation, normal effort, congested cough  Heart: Regular rate and rhythm  Abdomen:  Soft, nontender  Extremities:  1+ peripheral edema.  Neurologic: Nonfocal, moving all four extremities  Skin: No lesions, left hand ring finger edema  Access: Right PermCath    Basic Metabolic Panel: Recent Labs  Lab 10/25/21 0437 10/28/21 1005 10/29/21 0452  NA 135 136 138  K 3.3* 3.5 3.8  CL 100 99 103  CO2 29 28 27   GLUCOSE 118* 90 83  BUN 20 23 16   CREATININE 3.46* 4.34* 3.40*  CALCIUM 8.3* 8.6* 8.8*  MG  --   --  2.1     Liver Function Tests: Recent Labs  Lab 10/28/21 1005  AST 23  ALT 22   ALKPHOS 102  BILITOT 0.7  PROT 6.1*  ALBUMIN 2.3*    No results for input(s): "LIPASE", "AMYLASE" in the last 168 hours. No results for input(s): "AMMONIA" in the last 168 hours.  CBC: Recent Labs  Lab 10/25/21 0437 10/29/21 0452  WBC 11.7* 9.9  HGB 8.8* 8.9*  HCT 27.5* 28.8*  MCV 94.2 94.4  PLT 211 284     Cardiac Enzymes: No results for input(s): "CKTOTAL", "CKMB", "CKMBINDEX", "TROPONINI" in the last 168 hours.  BNP: Invalid input(s): "POCBNP"  CBG: Recent Labs  Lab 10/30/21 1158 10/30/21 1644 10/30/21 2040 10/31/21 0754 10/31/21 1319  GLUCAP 125* 122* 128* 95 122*     Microbiology: Results for orders placed or performed during the hospital encounter of 09/30/21  Resp Panel by RT-PCR (Flu A&B, Covid) Nasopharyngeal Swab     Status: None   Collection Time: 09/30/21  9:34 AM   Specimen: Nasopharyngeal Swab; Nasopharyngeal(NP) swabs in vial transport medium  Result Value Ref Range Status   SARS Coronavirus 2 by RT PCR NEGATIVE NEGATIVE Final    Comment: (NOTE) SARS-CoV-2 target nucleic acids are NOT DETECTED.  The SARS-CoV-2 RNA is generally detectable in upper respiratory specimens during the acute phase of infection. The lowest concentration of SARS-CoV-2 viral copies this assay can detect is 138 copies/mL. A negative result does not preclude SARS-Cov-2 infection and should not be used as the sole basis for treatment or other patient management decisions. A negative result  may occur with  improper specimen collection/handling, submission of specimen other than nasopharyngeal swab, presence of viral mutation(s) within the areas targeted by this assay, and inadequate number of viral copies(<138 copies/mL). A negative result must be combined with clinical observations, patient history, and epidemiological information. The expected result is Negative.  Fact Sheet for Patients:  EntrepreneurPulse.com.au  Fact Sheet for Healthcare  Providers:  IncredibleEmployment.be  This test is no t yet approved or cleared by the [place] FDA and  has been authorized for detection and/or diagnosis of SARS-CoV-2 by FDA under an Emergency Use Authorization (EUA). This EUA will remain  in effect (meaning this test can be used) for the duration of the COVID-19 declaration under Section 564(b)(1) of the Act, 21 U.S.C.section 360bbb-3(b)(1), unless the authorization is terminated  or revoked sooner.       Influenza A by PCR NEGATIVE NEGATIVE Final   Influenza B by PCR NEGATIVE NEGATIVE Final    Comment: (NOTE) The Xpert Xpress SARS-CoV-2/FLU/RSV plus assay is intended as an aid in the diagnosis of influenza from Nasopharyngeal swab specimens and should not be used as a sole basis for treatment. Nasal washings and aspirates are unacceptable for Xpert Xpress SARS-CoV-2/FLU/RSV testing.  Fact Sheet for Patients: EntrepreneurPulse.com.au  Fact Sheet for Healthcare Providers: IncredibleEmployment.be  This test is not yet approved or cleared by the [place] FDA and has been authorized for detection and/or diagnosis of SARS-CoV-2 by FDA under an Emergency Use Authorization (EUA). This EUA will remain in effect (meaning this test can be used) for the duration of the COVID-19 declaration under Section 564(b)(1) of the Act, 21 U.S.C. section 360bbb-3(b)(1), unless the authorization is terminated or revoked.  Performed at [facility], [address] [place], [place] [address]   SARS Coronavirus 2 by RT PCR (hospital order, performed in [facility] hospital lab) *cepheid single result test* Anterior Nasal Swab     Status: None   Collection Time: 10/23/21  4:06 PM   Specimen: Anterior Nasal Swab  Result Value Ref Range Status   SARS Coronavirus 2 by RT PCR NEGATIVE NEGATIVE Final    Comment: (NOTE) SARS-CoV-2 target nucleic acids are NOT DETECTED.  The  SARS-CoV-2 RNA is generally detectable in upper and lower respiratory specimens during the acute phase of infection. The lowest concentration of SARS-CoV-2 viral copies this assay can detect is 250 copies / mL. A negative result does not preclude SARS-CoV-2 infection and should not be used as the sole basis for treatment or other patient management decisions.  A negative result may occur with improper specimen collection / handling, submission of specimen other than nasopharyngeal swab, presence of viral mutation(s) within the areas targeted by this assay, and inadequate number of viral copies (<250 copies / mL). A negative result must be combined with clinical observations, patient history, and epidemiological information.  Fact Sheet for Patients:   https://www.patel.info/  Fact Sheet for Healthcare Providers: https://hall.com/  This test is not yet approved or  cleared by the [place] FDA and has been authorized for detection and/or diagnosis of SARS-CoV-2 by FDA under an Emergency Use Authorization (EUA).  This EUA will remain in effect (meaning this test can be used) for the duration of the COVID-19 declaration under Section 564(b)(1) of the Act, 21 U.S.C. section 360bbb-3(b)(1), unless the authorization is terminated or revoked sooner.  Performed at [facility], [address] [place], [place] [address]   Blood culture (routine x 2)     Status: None  Collection Time: 10/23/21  4:06 PM   Specimen: BLOOD  Result Value Ref Range Status   Specimen Description BLOOD BLOOD RIGHT HAND  Final   Special Requests   Final    BOTTLES DRAWN AEROBIC AND ANAEROBIC Blood Culture adequate volume   Culture   Final    NO GROWTH 5 DAYS Performed at [facility], [address], [place], [place] [address]    Report Status 10/28/2021 FINAL  Final  Culture, blood (Routine X 2) w Reflex to ID Panel     Status: None    Collection Time: 10/24/21 12:06 AM   Specimen: BLOOD  Result Value Ref Range Status   Specimen Description BLOOD LEFT HAND  Final   Special Requests   Final    BOTTLES DRAWN AEROBIC AND ANAEROBIC Blood Culture adequate volume   Culture   Final    NO GROWTH 5 DAYS Performed at [facility], [address] [place], [place] [address]    Report Status 10/29/2021 FINAL  Final  MRSA Next Gen by PCR, Nasal     Status: None   Collection Time: 10/24/21  2:24 PM   Specimen: Nasal Mucosa; Nasal Swab  Result Value Ref Range Status   MRSA by PCR Next Gen NOT DETECTED NOT DETECTED Final    Comment: (NOTE) The GeneXpert MRSA Assay (FDA approved for NASAL specimens only), is one component of a comprehensive MRSA colonization surveillance program. It is not intended to diagnose MRSA infection nor to guide or monitor treatment for MRSA infections. Test performance is not FDA approved in patients less than 9 years old. Performed at [facility], [place], [place] [address]   Respiratory (~20 pathogens) panel by PCR     Status: None   Collection Time: 10/24/21  2:24 PM   Specimen: Nasopharyngeal Swab; Respiratory  Result Value Ref Range Status   Adenovirus NOT DETECTED NOT DETECTED Final   Coronavirus 229E NOT DETECTED NOT DETECTED Final    Comment: (NOTE) The Coronavirus on the Respiratory Panel, DOES NOT test for the novel  Coronavirus (2019 nCoV)    Coronavirus HKU1 NOT DETECTED NOT DETECTED Final   Coronavirus NL63 NOT DETECTED NOT DETECTED Final   Coronavirus OC43 NOT DETECTED NOT DETECTED Final   Metapneumovirus NOT DETECTED NOT DETECTED Final   Rhinovirus / Enterovirus NOT DETECTED NOT DETECTED Final   Influenza A NOT DETECTED NOT DETECTED Final   Influenza B NOT DETECTED NOT DETECTED Final   Parainfluenza Virus 1 NOT DETECTED NOT DETECTED Final   Parainfluenza Virus 2 NOT DETECTED NOT DETECTED Final   Parainfluenza Virus 3 NOT DETECTED NOT DETECTED  Final   Parainfluenza Virus 4 NOT DETECTED NOT DETECTED Final   Respiratory Syncytial Virus NOT DETECTED NOT DETECTED Final   Bordetella pertussis NOT DETECTED NOT DETECTED Final   Bordetella Parapertussis NOT DETECTED NOT DETECTED Final   Chlamydophila pneumoniae NOT DETECTED NOT DETECTED Final   Mycoplasma pneumoniae NOT DETECTED NOT DETECTED Final    Comment: Performed at [facility], [place]. [address] [place], [place] [address]    Coagulation Studies: No results for input(s): "LABPROT", "INR" in the last 72 hours.  Urinalysis: No results for input(s): "COLORURINE", "LABSPEC", "PHURINE", "GLUCOSEU", "HGBUR", "BILIRUBINUR", "KETONESUR", "PROTEINUR", "UROBILINOGEN", "NITRITE", "LEUKOCYTESUR" in the last 72 hours.  Invalid input(s): "APPERANCEUR"    Imaging: No results found.   Medications:    promethazine (PHENERGAN) injection (IM or IVPB)      oxyCODONE-acetaminophen  calcium acetate  667 mg Oral TID WC   carvedilol  12.5 mg Oral QPM   Chlorhexidine Gluconate Cloth  6 each Topical Q0600   epoetin alfa       epoetin (EPOGEN/PROCRIT) injection  4,000 Units Intravenous Q T,Th,Sa-HD   feeding supplement (NEPRO CARB STEADY)  237 mL Oral TID BM   heparin  5,000 Units Subcutaneous Q12H   heparin sodium (porcine)       insulin aspart  0-15 Units Subcutaneous TID WC   lactulose  20 g Oral BID   lubiprostone  24 mcg Oral BID WC   metoCLOPramide (REGLAN) injection  10 mg Intravenous Once   multivitamin  1 tablet Oral QHS   pantoprazole  40 mg Oral BID   scopolamine  1 patch Transdermal Q72H   sodium chloride flush  3 mL Intravenous Q12H   oxyCODONE-acetaminophen, acetaminophen **OR** acetaminophen, epoetin alfa, guaiFENesin, heparin sodium (porcine), ondansetron (ZOFRAN) IV, oxyCODONE-acetaminophen, tiZANidine, topiramate  Assessment/ Plan:  [name] is a 66 y.o.  female with past medical history of ESRD with hemodialysis TTS, diabetes mellitus, hypertension  to the hospital seeking long term placement. She was recently admitted and received antibiotics for a dog bite.  She was discharged with a prescription for home antibiotics.  She states her caregivers were unable to retrieve antibiotics.  She states that there was an argument and that she is here seeking long-term placement.  Principal Problem:   Sepsis ([place]) Active Problems:   Hypertension   Type 2 diabetes mellitus ([place])   ESRD (end stage renal disease) ([place])   Financial difficulty   Anemia in chronic kidney disease   Cellulitis   Bent bone fracture of ulna   Hospital acquired PNA   Fecal impaction ([place])   [facility]/TTS/right PermCath  End Stage Renal Disease on hemodialysis:  Currently on TTS schedule. Received treatment today in chair, tolerated well. Will continue to perform dialysis with patient in chair to maintain tolerance. Encouraged patient and staff to sit in chair daily to promote mobility and decrease deconditioning.  Next treatment scheduled for Thursday  No results found for: "POTASSIUM"  Intake/Output Summary (Last 24 hours) at 10/31/2021 1341 Last data filed at 10/31/2021 1245 Gross per 24 hour  Intake 480 ml  Output 1500 ml  Net -1020 ml     2. Hypertension with chronic kidney disease:   Amlodipine stopped, remains on carvedilol every evening Blood pressure 132/53 during dialysis    BP 94/78 (BP Location: Right Arm)   Pulse 83   Temp 98.5 F (36.9 C)   Resp 17   Ht 5\' 6"  (1.676 m)   Wt 79.7 kg   SpO2 96%   BMI 28.36 kg/m   3. Anemia of chronic kidney disease/ kidney injury/chronic disease/acute blood loss:   Lab Results  Component Value Date   HGB 8.9 (L) 10/29/2021  Hgb below target Continue low-dose EPO with dialysis treatments.  4. Secondary Hyperparathyroidism:     Lab Results  Component Value Date   PTH 70 (H) 05/12/2020   CALCIUM 8.8 (L) 10/29/2021   PHOS 3.0 10/24/2021   Calcium and phosphorus at goal. Continue  calcium acetate with meals  5.  Severe constipation Bowel regimen of Dulcolax, lactulose, Nulytely Large BM on 6/18 Patient encouraged to increase mobility and manage pain with non-narcotics to decrease reoccurrence.     LOS: [facility] 6/20/20231:41 PM

## 2021-10-31 NOTE — Progress Notes (Addendum)
  Progress Note   Patient: Katie [name] LEX:[id number] DOB: 09/19/1955 DOA: 09/30/2021     8 DOS: the patient was seen and examined on 10/31/2021   Brief hospital course: Katie [name] is a 66 y.o. female with medical history significant of ESRD, DM II, homelessness. Had been admitted 05/13-05/17 for sepsis d/t cellulits from animal bite to arm, discharged on po abx and was unable to fill the Rx, was kicked out of where she was living, no way to get HD.  Patient has been in the ED since arriving the hospital.  She was admitted to the hospital again on 10/23/2021 as patient developed cough short of breath and fever.  Chest x-ray showed pneumonia.  Patient was diagnosed with sepsis and pneumonia, was started on cefepime and vancomycin. Had MRI performed on 6/14 did not show any osteomyelitis. Patient is currently homeless, has a severe weakness.  No discharge option at this time. 6/15.  Patient has no nausea, KUB showed fecal impaction with severe dilated colon, patient was started on senna as well as scheduled lactulose.  CT chest also showed dense right lower lobe consolidation consistent with aspiration pneumonia.  Shoulder x-ray does not show any osteomyelitis versus septic joint. Patient fecal impaction resolved after [place] on 6/17. Patient condition has stabilized, pending nursing home placement.  Assessment and Plan: Sepsis. Healthcare associate pneumonia. Recent dog bites with cellulitis. Condition improved, no new issues   Fecal impaction. Condition has resolved.  Continue lactulose.  Patient has been having daily bowel movements.   End stage renal disease on dialysis. Anemia of chronic kidney disease. Hypokalemia. Continue scheduled dialysis  Type 2 diabetes. Stable.  Essential hypertension. Continue current treatment        Subjective:  Patient doing better, able to sit in the recliner.  Still significant weakness.  Daily bowel movements now.  Physical Exam: Vitals:    10/31/21 0537 10/31/21 0804 10/31/21 0818 10/31/21 0933  BP: 133/62 (!) 145/67 110/70 116/90  Pulse: 67 67 69 71  Resp: 16 16 16 15   Temp: 98.4 F (36.9 C) 98.6 F (37 C) 98.3 F (36.8 C) 98.4 F (36.9 C)  TempSrc:   Oral Oral  SpO2: 99% 96% 96% 99%  Weight:      Height:       General exam: Appears calm and comfortable  Respiratory system: Crackles in base. Respiratory effort normal. Cardiovascular system: S1 & S2 heard, RRR. No JVD, murmurs, rubs, gallops or clicks. No pedal edema. Gastrointestinal system: Abdomen is nondistended, soft and nontender. No organomegaly or masses felt. Normal bowel sounds heard. Central nervous system: Alert and oriented. No focal neurological deficits. Extremities: Symmetric 5 x 5 power. Skin: No rashes, lesions or ulcers Psychiatry: Judgement and insight appear normal. Mood & affect appropriate.   Data Reviewed:  No new labs today.  Family Communication:   Disposition: Status is: Inpatient Remains inpatient appropriate because: Unsafe for discharge.  Planned Discharge Destination: Skilled nursing facility    Time spent: 27 minutes  Author: Sharen Hones, MD 10/31/2021 9:49 AM  For on call review [email or web address].

## 2021-10-31 NOTE — Progress Notes (Signed)
Hemodialysis Post Treatment Note:  Tx date:10/31/2021 Tx time: 3 hours Access: right CVC UF Removed: 1.5L  Note: HD treatment completed, tolerated well. No HD complications noted.Marland Kitchen

## 2021-10-31 NOTE — Progress Notes (Signed)
PT Cancellation Note  Patient Details Name: Katie [name] MRN: [id number] DOB: 08/27/1955   Cancelled Treatment:    Reason Eval/Treat Not Completed: Other (comment). Pt at HD this morning; attempted treatment this afternoon. Pt reporting a cough and difficulty catching her breath; does not appear to be in distress or have SOB while PT was in room. PT encouraged participation; pt declined. Will continue to attempt treatment at later time/date.    Patrina Levering PT, DPT

## 2021-10-31 NOTE — Progress Notes (Signed)
ID Doing better Pain left hand better Left shoulder pain better Movt better  O/e awake and alert BP (!) 116/59 (BP Location: Right Arm)   Pulse 72   Temp 99.3 F (37.4 C)   Resp 18   Ht 5\' 6"  (1.676 m)   Wt 79.7 kg   SpO2 93%   BMI 28.36 kg/m   Chest b/l ar entry Hss1s2 Abd soft Left hand , arm swelling improved No erythema or tenderness   Labs    Latest Ref Rng & Units 10/29/2021    4:52 AM 10/25/2021    4:37 AM 10/24/2021    9:56 AM  CBC  WBC 4.0 - 10.5 K/uL 9.9  11.7  18.2   Hemoglobin 12.0 - 15.0 g/dL 8.9  8.8  8.9   Hematocrit 36.0 - 46.0 % 28.8  27.5  28.4   Platelets 150 - 400 K/uL 284  211  238        Latest Ref Rng & Units 10/29/2021    4:52 AM 10/28/2021   10:05 AM 10/25/2021    4:37 AM  CMP  Glucose 70 - 99 mg/dL 83  90  118   BUN 8 - 23 mg/dL 16  23  20    Creatinine 0.44 - 1.00 mg/dL 3.40  4.34  3.46   Sodium 135 - 145 mmol/L 138  136  135   Potassium 3.5 - 5.1 mmol/L 3.8  3.5  3.3   Chloride 98 - 111 mmol/L 103  99  100   CO2 22 - 32 mmol/L 27  28  29    Calcium 8.9 - 10.3 mg/dL 8.8  8.6  8.3   Total Protein 6.5 - 8.1 g/dL  6.1    Total Bilirubin 0.3 - 1.2 mg/dL  0.7    Alkaline Phos 38 - 126 U/L  102    AST 15 - 41 U/L  23    ALT 0 - 44 U/L  22       Impression/recommendation  Fever - resolved D.D HCAP  VS  HD catheter  Was on vanco and cefepime  X7 days and completed RX Off antibiotics 48 hrs  If fever recurs then would hve to remove HD catheter Left shoulder pain- calcific tendinopathy  Dog bite left hand with infection- received appropriate treatment for 2 weeks-infection has resolved  Fecal impaction Resolved  ESRD on dialysis  Left arm Edema due to AVG left arm  Anemia  DM- on insulin  ID will sign off- call if needed

## 2021-10-31 NOTE — Progress Notes (Signed)
Nutrition Follow-up  DOCUMENTATION CODES:   Not applicable  INTERVENTION:   -Continue renal MVI daily -Continue Nepro Shake po TID, each supplement provides 425 kcal and 19 grams protein   NUTRITION DIAGNOSIS:   Increased nutrient needs related to catabolic illness (ESRD on HD) as evidenced by estimated needs.  Ongoing  GOAL:   Patient will meet greater than or equal to 90% of their needs  Progressing   MONITOR:   PO intake, Supplement acceptance, Labs, Weight trends, Skin, I & O's  REASON FOR ASSESSMENT:   Malnutrition Screening Tool    ASSESSMENT:   66 y/o female with h/o ESRD on HD, DM, HTN, homelessness and recent dog bite who is admitted with PNA, sepsis, cellulitis of left hand with septic arthropathy and ulnar fracture.  6/16- tap water enema provided secondary to constipation and fecal impaction  Reviewed I/O's: +480 ml x 24 hours and -2.2 L since 10/17/21   Pt out of room at time of visit.   Pt currently on a dysphagia 3 diet. Noted meal completions 25-75%. Pt is drinking Nepro supplements.   Per TOC notes, pt is medically stable for discharge; awaiting SNF placement.   Medications reviewed and include phoslo, epogen, lactulose, and reglan.    Labs reviewed: CBGS: 80-128 (inpatient orders for glycemic control are 0-15 units insulin aspart TID).    Diet Order:   Diet Order             DIET DYS 3 Room service appropriate? Yes; Fluid consistency: Thin  Diet effective now                   EDUCATION NEEDS:   Not appropriate for education at this time  Skin:  Skin Assessment: Skin Integrity Issues: Skin Integrity Issues:: Other (Comment) Other: laceration on lt finger 2/2 dog bite  Last BM:  10/30/21  Height:   Ht Readings from Last 1 Encounters:  10/24/21 5\' 6"  (1.676 m)    Weight:   Wt Readings from Last 1 Encounters:  10/30/21 79.7 kg    Ideal Body Weight:  59 kg  BMI:  Body mass index is 28.36 kg/m.  Estimated Nutritional  Needs:   Kcal:  2050-2250  Protein:  105-120 grams  Fluid:  1000 ml + UOP    Loistine Chance, RD, LDN, [place] Registered Dietitian II Certified Diabetes Care and Education Specialist Please refer to [facility] for RD and/or RD on-call/weekend/after hours pager

## 2021-11-01 ENCOUNTER — Encounter (INDEPENDENT_AMBULATORY_CARE_PROVIDER_SITE_OTHER): Payer: Medicaid Other

## 2021-11-01 ENCOUNTER — Inpatient Hospital Stay: Payer: Medicaid Other

## 2021-11-01 ENCOUNTER — Ambulatory Visit (INDEPENDENT_AMBULATORY_CARE_PROVIDER_SITE_OTHER): Payer: Medicaid Other | Admitting: Nurse Practitioner

## 2021-11-01 DIAGNOSIS — N186 End stage renal disease: Secondary | ICD-10-CM | POA: Diagnosis not present

## 2021-11-01 DIAGNOSIS — A419 Sepsis, unspecified organism: Secondary | ICD-10-CM | POA: Diagnosis not present

## 2021-11-01 DIAGNOSIS — I350 Nonrheumatic aortic (valve) stenosis: Secondary | ICD-10-CM

## 2021-11-01 DIAGNOSIS — J189 Pneumonia, unspecified organism: Secondary | ICD-10-CM | POA: Diagnosis not present

## 2021-11-01 LAB — CBC
HCT: 28.1 % — ABNORMAL LOW (ref 36.0–46.0)
Hemoglobin: 8.7 g/dL — ABNORMAL LOW (ref 12.0–15.0)
MCH: 29.6 pg (ref 26.0–34.0)
MCHC: 31 g/dL (ref 30.0–36.0)
MCV: 95.6 fL (ref 80.0–100.0)
Platelets: 304 10*3/uL (ref 150–400)
RBC: 2.94 MIL/uL — ABNORMAL LOW (ref 3.87–5.11)
RDW: 14.7 % (ref 11.5–15.5)
WBC: 9.6 10*3/uL (ref 4.0–10.5)
nRBC: 0 % (ref 0.0–0.2)

## 2021-11-01 LAB — GLUCOSE, CAPILLARY
Glucose-Capillary: 104 mg/dL — ABNORMAL HIGH (ref 70–99)
Glucose-Capillary: 118 mg/dL — ABNORMAL HIGH (ref 70–99)
Glucose-Capillary: 130 mg/dL — ABNORMAL HIGH (ref 70–99)
Glucose-Capillary: 115 mg/dL — ABNORMAL HIGH (ref 70–99)

## 2021-11-01 NOTE — Progress Notes (Signed)
Physical Therapy Treatment Patient Details Name: Katie [name] MRN: [id number] DOB: 13-Jul-1955 Today's Date: 11/01/2021   History of Present Illness Pt is a 66 y.o. female presenting to hospital 5/20 with concerns for worsening L hand pain d/t animal bite (pt recently admitted 5/13-5/17 for same reason). Pt's friend dropped her off at dialysis and refused to do any more (pt now homeless).  PMH includes ESRD on HD TTS, DM, htn, orthostatic hypotension, and h/o mandible surgery.    PT Comments    Pt was long sitting in bed upon arriving. She agrees to session and is cooperative throughout. Does endorse slight discomfort on LUE (dog bite site) however did not limit session progression. Pt moves well however was limited today by symptoms of hypotension. Did not have severe drop in BP with positional changes. She was only able to tolerate stand pivot to recliner due to dizziness+ severe nausea with standing. BP in sitting 124/61, in standing 114/54. Attempted to wean from 2L [place] but on rm air, desaturates to 86%. Reapplied 2L O2 [place] throughout remainder of session.  Acute PT will continue to follow and progress as able per current POC.    Recommendations for follow up therapy are one component of a multi-disciplinary discharge planning process, led by the attending physician.  Recommendations may be updated based on patient status, additional functional criteria and insurance authorization.  Follow Up Recommendations  Skilled nursing-short term rehab (<3 hours/day)     Assistance Recommended at Discharge Frequent or constant Supervision/Assistance  Patient can return home with the following A little help with walking and/or transfers;A little help with bathing/dressing/bathroom;Assistance with cooking/housework;Assistance with feeding;Direct supervision/assist for medications management;Direct supervision/assist for financial management;Assist for transportation;Help with stairs or ramp for entrance    Equipment Recommendations  Other (comment) (defer to next level of care)       Precautions / Restrictions Precautions Precautions: Fall Precaution Comments: R IJ permcath; L UE AV fistula Restrictions Weight Bearing Restrictions: No     Mobility  Bed Mobility Overal bed mobility: Modified Independent Bed Mobility: Supine to Sit, Sit to Supine     Supine to sit: HOB elevated, Modified independent (Device/Increase time) Sit to supine: Modified independent (Device/Increase time), HOB elevated   General bed mobility comments: Increased time to perform. did use bed functions and bedrail however no physical assistance or Vc. BP 124/61(79) standing  114/54. pt has extensive hx of orthostatic hypotension however no drop in BP however pt endorses symptoms. quickly had pt sit in recliner. elected not to progress further due to hx.    Transfers Overall transfer level: Needs assistance Equipment used: Rolling walker (2 wheels) Transfers: Sit to/from Stand Sit to Stand: Min guard    General transfer comment: CGA for safety. no physical assistance required however upon standing, pt gets unsteady and endorses severe dizziness/nausea. She did take a few steps to turn and sit in recliner but unable to advance to ambulation due to symptoms    Ambulation/Gait    General Gait Details: unable due to dizziness     Balance Overall balance assessment: Needs assistance Sitting-balance support: No upper extremity supported, Feet supported Sitting balance-Leahy Scale: Good     Standing balance support: Bilateral upper extremity supported, During functional activity, Reliant on assistive device for balance Standing balance-Leahy Scale: Poor       Cognition Arousal/Alertness: Awake/alert Behavior During Therapy: WFL for tasks assessed/performed Overall Cognitive Status: Within Functional Limits for tasks assessed      General Comments: Pt was A  and O x 3. slightly clueless on overall  situation but is cooperative and agreeable.           General Comments General comments (skin integrity, edema, etc.): Educated on the importance of performing ther ex throughout the day to promote strengthening and circulation. She states understanding but will need staff to encourage increased upright posture and activity.      Pertinent Vitals/Pain Pain Assessment Pain Assessment: 0-10 Pain Score: 6  Pain Location: L hand pain Pain Descriptors / Indicators: Grimacing, Discomfort, Aching Pain Intervention(s): Limited activity within patient's tolerance, Monitored during session, Premedicated before session, Repositioned     PT Goals (current goals can now be found in the care plan section) Acute Rehab PT Goals Patient Stated Goal: get stronger and have less L hand/arm pain Progress towards PT goals: Progressing toward goals    Frequency    Min 2X/week      PT Plan Current plan remains appropriate       AM-PAC PT "6 Clicks" Mobility   Outcome Measure  Help needed turning from your back to your side while in a flat bed without using bedrails?: None Help needed moving from lying on your back to sitting on the side of a flat bed without using bedrails?: A Little Help needed moving to and from a bed to a chair (including a wheelchair)?: A Little Help needed standing up from a chair using your arms (e.g., wheelchair or bedside chair)?: A Little Help needed to walk in hospital room?: A Little Help needed climbing 3-5 steps with a railing? : A Little 6 Click Score: 19    End of Session Equipment Utilized During Treatment: Oxygen (Attempted to wean from O2 however desaturates to 86% on rm air. On 2L was able to maintain > 92%) Activity Tolerance: Patient tolerated treatment well;Other (comment) (limited by dizziness and fatigue) Patient left: in chair;with call bell/phone within reach;with chair alarm set Nurse Communication: Mobility status PT Visit Diagnosis: Other  abnormalities of gait and mobility (R26.89);Unsteadiness on feet (R26.81);Muscle weakness (generalized) (M62.81);History of falling (Z91.81);Pain Pain - Right/Left: Left Pain - part of body: Shoulder;Hand;Arm     Time: 5038-8828 PT Time Calculation (min) (ACUTE ONLY): 35 min  Charges:  $Therapeutic Activity: 23-37 mins                    Julaine Fusi PTA 11/01/21, 12:25 PM

## 2021-11-01 NOTE — Progress Notes (Signed)
[facility]  Dialysis Note   Subjective:   Patient seen sitting at side of bed Preparing to work with physical therapy States she feels well Tolerated dialysis well seated in chair yesterday  Objective:  Vital signs in last 24 hours:  Temp:  [97.7 F (36.5 C)-99.3 F (37.4 C)] 97.7 F (36.5 C) (06/21 0831) Pulse Rate:  [66-83] 74 (06/21 0831) Resp:  [11-21] 18 (06/21 0831) BP: (94-149)/(53-78) 149/75 (06/21 0831) SpO2:  [91 %-100 %] 91 % (06/21 0831)  Weight change:  Filed Weights   10/28/21 1237 10/29/21 0500 10/30/21 0500  Weight: 84.2 kg 79.7 kg 79.7 kg    Intake/Output: I/O last 3 completed shifts: In: 240 [P.O.:240] Out: 1500 [Other:1500]   Intake/Output this shift:  No intake/output data recorded.  Physical Exam: General: NAD  Head: Normocephalic, atraumatic. Moist oral mucosal membranes  Eyes: Anicteric  Lungs:  Clear to auscultation, normal effort, congested cough  Heart: Regular rate and rhythm  Abdomen:  Soft, nontender  Extremities: No peripheral edema.  Neurologic: Nonfocal, moving all four extremities  Skin: No lesions  Access: Right PermCath    Basic Metabolic Panel: Recent Labs  Lab 10/28/21 1005 10/29/21 0452  NA 136 138  K 3.5 3.8  CL 99 103  CO2 28 27  GLUCOSE 90 83  BUN 23 16  CREATININE 4.34* 3.40*  CALCIUM 8.6* 8.8*  MG  --  2.1     Liver Function Tests: Recent Labs  Lab 10/28/21 1005  AST 23  ALT 22  ALKPHOS 102  BILITOT 0.7  PROT 6.1*  ALBUMIN 2.3*    No results for input(s): "LIPASE", "AMYLASE" in the last 168 hours. No results for input(s): "AMMONIA" in the last 168 hours.  CBC: Recent Labs  Lab 10/29/21 0452 11/01/21 0508  WBC 9.9 9.6  HGB 8.9* 8.7*  HCT 28.8* 28.1*  MCV 94.4 95.6  PLT 284 304     Cardiac Enzymes: No results for input(s): "CKTOTAL", "CKMB", "CKMBINDEX", "TROPONINI" in the last 168 hours.  BNP: Invalid input(s): "POCBNP"  CBG: Recent Labs  Lab 10/31/21 0754  10/31/21 1319 10/31/21 1646 10/31/21 1956 11/01/21 0827  GLUCAP 95 122* 134* 110* 104*     Microbiology: Results for orders placed or performed during the hospital encounter of 09/30/21  Resp Panel by RT-PCR (Flu A&B, Covid) Nasopharyngeal Swab     Status: None   Collection Time: 09/30/21  9:34 AM   Specimen: Nasopharyngeal Swab; Nasopharyngeal(NP) swabs in vial transport medium  Result Value Ref Range Status   SARS Coronavirus 2 by RT PCR NEGATIVE NEGATIVE Final    Comment: (NOTE) SARS-CoV-2 target nucleic acids are NOT DETECTED.  The SARS-CoV-2 RNA is generally detectable in upper respiratory specimens during the acute phase of infection. The lowest concentration of SARS-CoV-2 viral copies this assay can detect is 138 copies/mL. A negative result does not preclude SARS-Cov-2 infection and should not be used as the sole basis for treatment or other patient management decisions. A negative result may occur with  improper specimen collection/handling, submission of specimen other than nasopharyngeal swab, presence of viral mutation(s) within the areas targeted by this assay, and inadequate number of viral copies(<138 copies/mL). A negative result must be combined with clinical observations, patient history, and epidemiological information. The expected result is Negative.  Fact Sheet for Patients:  EntrepreneurPulse.com.au  Fact Sheet for Healthcare Providers:  IncredibleEmployment.be  This test is no t yet approved or cleared by the [place] FDA and  has been  authorized for detection and/or diagnosis of SARS-CoV-2 by FDA under an Emergency Use Authorization (EUA). This EUA will remain  in effect (meaning this test can be used) for the duration of the COVID-19 declaration under Section 564(b)(1) of the Act, 21 U.S.C.section 360bbb-3(b)(1), unless the authorization is terminated  or revoked sooner.       Influenza A by PCR NEGATIVE  NEGATIVE Final   Influenza B by PCR NEGATIVE NEGATIVE Final    Comment: (NOTE) The Xpert Xpress SARS-CoV-2/FLU/RSV plus assay is intended as an aid in the diagnosis of influenza from Nasopharyngeal swab specimens and should not be used as a sole basis for treatment. Nasal washings and aspirates are unacceptable for Xpert Xpress SARS-CoV-2/FLU/RSV testing.  Fact Sheet for Patients: EntrepreneurPulse.com.au  Fact Sheet for Healthcare Providers: IncredibleEmployment.be  This test is not yet approved or cleared by the [place] FDA and has been authorized for detection and/or diagnosis of SARS-CoV-2 by FDA under an Emergency Use Authorization (EUA). This EUA will remain in effect (meaning this test can be used) for the duration of the COVID-19 declaration under Section 564(b)(1) of the Act, 21 U.S.C. section 360bbb-3(b)(1), unless the authorization is terminated or revoked.  Performed at [facility], [address] [place], [place] [address]   SARS Coronavirus 2 by RT PCR (hospital order, performed in [facility] hospital lab) *cepheid single result test* Anterior Nasal Swab     Status: None   Collection Time: 10/23/21  4:06 PM   Specimen: Anterior Nasal Swab  Result Value Ref Range Status   SARS Coronavirus 2 by RT PCR NEGATIVE NEGATIVE Final    Comment: (NOTE) SARS-CoV-2 target nucleic acids are NOT DETECTED.  The SARS-CoV-2 RNA is generally detectable in upper and lower respiratory specimens during the acute phase of infection. The lowest concentration of SARS-CoV-2 viral copies this assay can detect is 250 copies / mL. A negative result does not preclude SARS-CoV-2 infection and should not be used as the sole basis for treatment or other patient management decisions.  A negative result may occur with improper specimen collection / handling, submission of specimen other than nasopharyngeal swab, presence of viral mutation(s)  within the areas targeted by this assay, and inadequate number of viral copies (<250 copies / mL). A negative result must be combined with clinical observations, patient history, and epidemiological information.  Fact Sheet for Patients:   https://www.patel.info/  Fact Sheet for Healthcare Providers: https://hall.com/  This test is not yet approved or  cleared by the [place] FDA and has been authorized for detection and/or diagnosis of SARS-CoV-2 by FDA under an Emergency Use Authorization (EUA).  This EUA will remain in effect (meaning this test can be used) for the duration of the COVID-19 declaration under Section 564(b)(1) of the Act, 21 U.S.C. section 360bbb-3(b)(1), unless the authorization is terminated or revoked sooner.  Performed at [facility], [address] [place], [place] [address]   Blood culture (routine x 2)     Status: None   Collection Time: 10/23/21  4:06 PM   Specimen: BLOOD  Result Value Ref Range Status   Specimen Description BLOOD BLOOD RIGHT HAND  Final   Special Requests   Final    BOTTLES DRAWN AEROBIC AND ANAEROBIC Blood Culture adequate volume   Culture   Final    NO GROWTH 5 DAYS Performed at [facility], [address], [place], [place] [address]    Report Status 10/28/2021 FINAL  Final  Culture, blood (Routine  X 2) w Reflex to ID Panel     Status: None   Collection Time: 10/24/21 12:06 AM   Specimen: BLOOD  Result Value Ref Range Status   Specimen Description BLOOD LEFT HAND  Final   Special Requests   Final    BOTTLES DRAWN AEROBIC AND ANAEROBIC Blood Culture adequate volume   Culture   Final    NO GROWTH 5 DAYS Performed at [facility], [address] [place], [place] [address]    Report Status 10/29/2021 FINAL  Final  MRSA Next Gen by PCR, Nasal     Status: None   Collection Time: 10/24/21  2:24 PM   Specimen: Nasal Mucosa; Nasal Swab  Result Value  Ref Range Status   MRSA by PCR Next Gen NOT DETECTED NOT DETECTED Final    Comment: (NOTE) The GeneXpert MRSA Assay (FDA approved for NASAL specimens only), is one component of a comprehensive MRSA colonization surveillance program. It is not intended to diagnose MRSA infection nor to guide or monitor treatment for MRSA infections. Test performance is not FDA approved in patients less than 67 years old. Performed at [facility], [place], [place] [address]   Respiratory (~20 pathogens) panel by PCR     Status: None   Collection Time: 10/24/21  2:24 PM   Specimen: Nasopharyngeal Swab; Respiratory  Result Value Ref Range Status   Adenovirus NOT DETECTED NOT DETECTED Final   Coronavirus 229E NOT DETECTED NOT DETECTED Final    Comment: (NOTE) The Coronavirus on the Respiratory Panel, DOES NOT test for the novel  Coronavirus (2019 nCoV)    Coronavirus HKU1 NOT DETECTED NOT DETECTED Final   Coronavirus NL63 NOT DETECTED NOT DETECTED Final   Coronavirus OC43 NOT DETECTED NOT DETECTED Final   Metapneumovirus NOT DETECTED NOT DETECTED Final   Rhinovirus / Enterovirus NOT DETECTED NOT DETECTED Final   Influenza A NOT DETECTED NOT DETECTED Final   Influenza B NOT DETECTED NOT DETECTED Final   Parainfluenza Virus 1 NOT DETECTED NOT DETECTED Final   Parainfluenza Virus 2 NOT DETECTED NOT DETECTED Final   Parainfluenza Virus 3 NOT DETECTED NOT DETECTED Final   Parainfluenza Virus 4 NOT DETECTED NOT DETECTED Final   Respiratory Syncytial Virus NOT DETECTED NOT DETECTED Final   Bordetella pertussis NOT DETECTED NOT DETECTED Final   Bordetella Parapertussis NOT DETECTED NOT DETECTED Final   Chlamydophila pneumoniae NOT DETECTED NOT DETECTED Final   Mycoplasma pneumoniae NOT DETECTED NOT DETECTED Final    Comment: Performed at [facility], [place]. [address], [place], [place] [address]    Coagulation Studies: No results for input(s): "LABPROT", "INR" in the last 72  hours.  Urinalysis: No results for input(s): "COLORURINE", "LABSPEC", "PHURINE", "GLUCOSEU", "HGBUR", "BILIRUBINUR", "KETONESUR", "PROTEINUR", "UROBILINOGEN", "NITRITE", "LEUKOCYTESUR" in the last 72 hours.  Invalid input(s): "APPERANCEUR"    Imaging: No results found.   Medications:    promethazine (PHENERGAN) injection (IM or IVPB)      calcium acetate  667 mg Oral TID WC   carvedilol  12.5 mg Oral QPM   Chlorhexidine Gluconate Cloth  6 each Topical Q0600   epoetin (EPOGEN/PROCRIT) injection  4,000 Units Intravenous Q T,Th,Sa-HD   feeding supplement (NEPRO CARB STEADY)  237 mL Oral TID BM   heparin  5,000 Units Subcutaneous Q12H   insulin aspart  0-15 Units Subcutaneous TID WC   lactulose  20 g Oral BID   lubiprostone  24 mcg Oral BID WC   metoCLOPramide (  REGLAN) injection  10 mg Intravenous Once   multivitamin  1 tablet Oral QHS   pantoprazole  40 mg Oral BID   scopolamine  1 patch Transdermal Q72H   sodium chloride flush  3 mL Intravenous Q12H   acetaminophen **OR** acetaminophen, guaiFENesin, ondansetron (ZOFRAN) IV, oxyCODONE-acetaminophen, tiZANidine, topiramate  Assessment/ Plan:  [name] is a 65 y.o.  female with past medical history of ESRD with hemodialysis TTS, diabetes mellitus, hypertension to the hospital seeking long term placement. She was recently admitted and received antibiotics for a dog bite.  She was discharged with a prescription for home antibiotics.  She states her caregivers were unable to retrieve antibiotics.  She states that there was an argument and that she is here seeking long-term placement.  Principal Problem:   Sepsis ([place]) Active Problems:   Hypertension   Type 2 diabetes mellitus ([place])   ESRD (end stage renal disease) ([place])   Financial difficulty   Anemia in chronic kidney disease   Cellulitis   Bent bone fracture of ulna   Hospital acquired PNA   Fecal impaction ([place])   [facility] /TTS/right PermCath  End  Stage Renal Disease on hemodialysis:  Currently on TTS schedule. Patient received dialysis yesterday, UF 1.5 L achieved.   Will continue to perform dialysis with patient in chair to maintain tolerance.  Next treatment scheduled for Thursday  No results found for: "POTASSIUM"  Intake/Output Summary (Last 24 hours) at 11/01/2021 1010 Last data filed at 10/31/2021 1700 Gross per 24 hour  Intake 240 ml  Output 1500 ml  Net -1260 ml     2. Hypertension with chronic kidney disease:   Remains on carvedilol every evening Blood pressure 149/75   BP (!) 149/75 (BP Location: Left Arm)   Pulse 74   Temp 97.7 F (36.5 C) (Oral)   Resp 18   Ht 5\' 6"  (1.676 m)   Wt 79.7 kg   SpO2 91%   BMI 28.36 kg/m   3. Anemia of chronic kidney disease/ kidney injury/chronic disease/acute blood loss:   Lab Results  Component Value Date   HGB 8.7 (L) 11/01/2021  Hgb below acceptable target Receiving low-dose EPO with dialysis treatments.  4. Secondary Hyperparathyroidism:     Lab Results  Component Value Date   PTH 70 (H) 05/12/2020   CALCIUM 8.8 (L) 10/29/2021   PHOS 3.0 10/24/2021   We will continue to monitor bone minerals during this admission. Continue calcium acetate with meals  5.  Severe constipation Bowel regimen of Dulcolax, lactulose, Nulytely Large BM on 6/18 Patient encouraged to increase mobility and manage pain with non-narcotics to decrease reoccurrence.     LOS: [facility] 6/21/202310:10 AM

## 2021-11-01 NOTE — TOC Progression Note (Signed)
Transition of Care [facility]) - Progression Note    Patient Details  Name: Katie [name] MRN: [id number] Date of Birth: [birth year]  Transition of [facility]) CM/SW Contact  Anselm Pancoast, RN Phone Number: 11/01/2021, 8:08 AM  Clinical Narrative:    No updates from Lavone Orn from [facility] secure email asking for outcome of review and potential placement.    Expected Discharge Plan: Skilled Nursing Facility Barriers to Discharge: SNF Pending bed offer  Expected Discharge Plan and Services Expected Discharge Plan: [place]   Discharge Planning Services: CM Consult Post Acute Care Choice: [place] Living arrangements for the past 2 months: Single Family Home                 DME Arranged: N/A DME Agency: NA       HH Arranged: NA HH Agency: NA         Social Determinants of Health (SDOH) Interventions    Readmission Risk Interventions    09/27/2021    1:44 PM 09/26/2021   12:46 PM 03/08/2021    2:49 PM  Readmission Risk Prevention Plan  Transportation Screening  Complete Complete  Palliative Care Screening Not Applicable    Medication Review (RN Care Manager)  Complete Complete  HRI or Home Care Consult   Complete  SW Recovery Care/Counseling Consult  Complete   Palliative Care Screening  Not Applicable   [place]  Not Applicable Complete

## 2021-11-01 NOTE — Progress Notes (Signed)
  Progress Note   Patient: Katie [name] TGG:[id number] DOB: [birth year] DOA: 09/30/2021     9 DOS: the patient was seen and examined on 11/01/2021   Brief hospital course: Nishika Parkhurst is a 67 y.o. female with medical history significant of ESRD, DM II, homelessness. Had been admitted 05/13-05/17 for sepsis d/t cellulits from animal bite to arm, discharged on po abx and was unable to fill the Rx, was kicked out of where she was living, no way to get HD.  Patient has been in the ED since arriving the hospital.  She was admitted to the hospital again on 10/23/2021 as patient developed cough short of breath and fever.  Chest x-ray showed pneumonia.  Patient was diagnosed with sepsis and pneumonia, was started on cefepime and vancomycin. Had MRI performed on 6/14 did not show any osteomyelitis. Patient is currently homeless, has a severe weakness.  No discharge option at this time. 6/15.  Patient has no nausea, KUB showed fecal impaction with severe dilated colon, patient was started on senna as well as scheduled lactulose.  CT chest also showed dense right lower lobe consolidation consistent with aspiration pneumonia.  Shoulder x-ray does not show any osteomyelitis versus septic joint. Patient fecal impaction resolved after Nulytely on 6/17. Patient condition has stabilized, pending nursing home placement.  Assessment and Plan:  Sepsis. Healthcare associate pneumonia. Recent dog bites with cellulitis. Patient did not have osteomyelitis of the finger or shoulder.  He had a dense right lower lobe consolidation consistent with aspiration pneumonia. Completed 5 days antibiotics. She continues to have crackles in the bases, modified barium swallow did not show any evidence of aspiration.  Probably due to volume from end-stage renal disease. Condition is essentially improved.   Fecal impaction. Patient had a massive fecal impaction with colon size up to 10 cm.  Condition resolved after giving Nulytely.    End stage renal disease on dialysis. Anemia of chronic kidney disease. Hypokalemia. Continue scheduled dialysis  Type 2 diabetes. Stable.  Essential hypertension. Continue current treatment  Mild aortic stenosis. Patient prior echocardiogram showed mild aortic stenosis, need outpatient follow-up.     Subjective:  Patient feels well, still have significant fatigue. Did not feel short of breath.  Physical Exam: Vitals:   10/31/21 1648 10/31/21 1956 11/01/21 0527 11/01/21 0831  BP: (!) 116/59 131/62 (!) 142/66 (!) 149/75  Pulse: 72 72 78 74  Resp: 18 18  18   Temp: 99.3 F (37.4 C) 99 F (37.2 C) 98.2 F (36.8 C) 97.7 F (36.5 C)  TempSrc:  Oral Oral Oral  SpO2: 93% 95% 94% 91%  Weight:      Height:       General exam: Appears calm and comfortable  Respiratory system: Crackles in the base bilaterally. Respiratory effort normal. Cardiovascular system: S1 & S2 heard, RRR.Marland Kitchen 3/6 SM at RUSB. No pedal edema. Gastrointestinal system: Abdomen is nondistended, soft and nontender. No organomegaly or masses felt. Normal bowel sounds heard. Central nervous system: Alert and oriented. No focal neurological deficits. Extremities: Symmetric 5 x 5 power. Skin: No rashes, lesions or ulcers Psychiatry: Judgement and insight appear normal. Mood & affect appropriate.   Data Reviewed:  Lab results reviewed  Family Communication: No family  Disposition: Status is: Inpatient Remains inpatient appropriate because: Unsafe discharge  Planned Discharge Destination: Skilled nursing facility    Time spent: 35 minutes  Author: Sharen Hones, MD 11/01/2021 12:34 PM  For on call review [email or web address].

## 2021-11-01 NOTE — Evaluation (Addendum)
Objective Swallowing Evaluation: Type of Study: MBS-Modified Barium Swallow Study   Patient Details  Name: Katie [name] MRN: [id number] Date of Birth: 1955-09-12  Today's Date: 11/01/2021 Time: SLP Start Time (ACUTE ONLY): 1115 -SLP Stop Time (ACUTE ONLY): 1215  SLP Time Calculation (min) (ACUTE ONLY): 60 min   Past Medical History:  Past Medical History:  Diagnosis Date   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[place])    Hypertension    Renal disorder    Type 2 diabetes mellitus ([place])    Past Surgical History:  Past Surgical History:  Procedure Laterality Date   APPENDECTOMY     AV FISTULA PLACEMENT Left 03/16/2021   Procedure: INSERTION OF ARTERIOVENOUS (AV) GORE-TEX GRAFT ARM;  Surgeon: Algernon Huxley, MD;  Location: [facility] ORS;  Service: Vascular;  Laterality: Left;   DIALYSIS/PERMA CATHETER INSERTION N/A 05/16/2020   Procedure: DIALYSIS/PERMA CATHETER INSERTION;  Surgeon: Algernon Huxley, MD;  Location: [facility];  Service: Cardiovascular;  Laterality: N/A;   MANDIBLE SURGERY     RENAL BIOPSY Right    RIGHT HEART CATH N/A 05/10/2020   Procedure: RIGHT HEART CATH;  Surgeon: Nelva Bush, MD;  Location: [facility];  Service: Cardiovascular;  Laterality: N/A;   TEMPORARY DIALYSIS CATHETER N/A 05/11/2020   Procedure: TEMPORARY DIALYSIS CATHETER;  Surgeon: Algernon Huxley, MD;  Location: [facility];  Service: Cardiovascular;  Laterality: N/A;   HPI: Pt  is a 66 y.o. female with medical history significant of ESRD, HD, vision deficits reported by pt, DM II, Homelessness. Had been admitted 05/13-05/17 for sepsis d/t cellulits from animal bite to arm, discharged on po abx and was unable to fill the Rx, was kicked out of where she was living, no way to get HD.  Patient has been in the ED since arriving the hospital.  She was admitted to the hospital again on 10/23/2021 as patient developed cough short of breath and fever.  Chest x-ray showed pneumonia.  Patient  was diagnosed with sepsis and pneumonia, was started on cefepime and vancomycin.  Had MRI performed on 6/14 did not show any osteomyelitis.  Patient condition appears to be improving.  Patient has been seen by ID, currently on vancomycin and cefepime.  Patient no longer has any fever, blood culture has been negative.   Patient is currently homeless, has a severe weakness.  No discharge option at this time.   CXR on 10/23/21: bibasilar airspace disease, left greater than right, with  persistent small pleural effusions.   Subjective: pt was verbal and talkative. Pleasant and followed instructions. Vision deficits reported.    Recommendations for follow up therapy are one component of a multi-disciplinary discharge planning process, led by the attending physician.  Recommendations may be updated based on patient status, additional functional criteria and insurance authorization.  Assessment / Plan / Recommendation     11/01/2021    2:00 PM  Clinical Impressions  SLP Visit Diagnosis Pt seen today for MBSS per MD request to r/o any oropharyngeal phase swallowing deficits. Pt has a Baseline, hacking cough; also COPD w/ cough per pt report("for years"). Pt has been tolerating a mech soft diet w/ thin liquids this admit; afebrile and WBC WNL. Pt is on 2L [place] O2 at baseline.   Pt appears to present w/ No oropharyngeal phase dysphagia; No neuromuscular deficits of swallowing. No aspiration nor penetration w/ po trials occurred during this study.  OF NOTE: during the Esophageal Phase, a  slightly prominent thickening of the cricopharyngeus muscle w/ min protrusion of tissue from posterior pharyngeal wall was apparent during swallowing intermittently -- this did not appear to impede bolus motility through the UES. Further below, there appeared to be bulge into the lower cervical esophageal space from an osteophyte - this did not appear to impede bolus motility/clearing either. Any dysmotility in the cervical  esophagus during swallowing/oral intake could impact the pharyngeal phase of swallowing.   During the oral phase, adequate bolus management and control of bolus propulsion for A-P transfer occurred. Oral clearing achieved w/ all trial consistencies. Oral phase of swallowing appeared grossly WFL in setting of Edentulous status -- min extra time needed for mashing/gumming increased textured trial. Encouraged moistening foods well; small, cut pieces of foods. During the pharyngeal phase, pharyngeal swallow initiation appeared to occur at approximately the Valleculae w/ thin liquids spilling to the Pyriform Sinuses intermittently, moreso larger sips. No aspiration nor penetration occurred; airway closure appeared tight. No significant pharyngeal residue remained post swallow indicating adequate laryngeal excursion and pharyngeal pressure during the swallow. Any slight amount of oropharyngeal phase bolus residue was cleared w/ pt's independent, f/u swallow.   Discussed results of MBSS and impact of any Esophageal phase issues such as Reflux activity, dysmotility on swallowing. Also discussed the impact of Respiratory status/COPD on swallowing. Encouraged Full mashing/gumming of solid foods b/f swallowing as pt does not eat w/ Dentures and she requests a more Regular diet for food choices. Video viewed and questions answered. Handout given on general aspiration precautions. Recommended f/u w/ GI if any need of assessment of Reflux; education.   No further skilled ST services indicated at this time. MD/NSG updated, agreed. Pt agreed.  Dysphagia, unspecified (R13.10)  Impact on safety and function --         11/01/2021    2:00 PM  Treatment Recommendations  Treatment Recommendations No treatment recommended at this time        11/01/2021    2:00 PM  Prognosis  Prognosis for Safe Diet Advancement Good  Barriers to Reach Goals Time post onset;Severity of deficits       11/01/2021    2:00 PM  Diet  Recommendations  SLP Diet Recommendations Regular solids;Thin liquid  Liquid Administration via Cup;Straw  Medication Administration Whole meds with liquid  Compensations Minimize environmental distractions;Slow rate;Small sips/bites;Lingual sweep for clearance of pocketing;Follow solids with liquid  Postural Changes Remain semi-upright after after feeds/meals (Comment);Seated upright at 90 degrees         11/01/2021    2:00 PM  Other Recommendations  Recommended Consults Consider GI evaluation;Consider esophageal assessment  Oral Care Recommendations Oral care BID;Oral care before and after PO;Patient independent with oral care  Other Recommendations --  Follow Up Recommendations No SLP follow up  Assistance recommended at discharge PRN  Functional Status Assessment Patient has not had a recent decline in their functional status       11/01/2021    2:00 PM  Frequency and Duration   Speech Therapy Frequency (ACUTE ONLY) --  Treatment Duration --         11/01/2021    2:00 PM  Oral Phase  Oral Phase WFL - grossly WFL in setting of Edentulous status -- min extra time for mashing/gumming increased textured trial       11/01/2021    2:00 PM  Pharyngeal Phase  Pharyngeal Phase WFL - grossly        11/01/2021    2:00 PM  Cervical  Esophageal Phase   Cervical Esophageal Phase WFL - grossly -- noted prominent cricopharyngeus muscle during the swallow intermittently but did not appear to impede bolus clearing through the UES. Apparent bulge of an osteophyte in lower cervical esophagus which did not appear to impede bolus clearing.           Orinda Kenner, MS, CCC-SLP Speech Language Pathologist Rehab Services; [place] [phone] (ascom) Katie [name] 11/01/2021, 2:12 PM

## 2021-11-02 DIAGNOSIS — A419 Sepsis, unspecified organism: Secondary | ICD-10-CM | POA: Diagnosis not present

## 2021-11-02 LAB — GLUCOSE, CAPILLARY
Glucose-Capillary: 97 mg/dL (ref 70–99)
Glucose-Capillary: 102 mg/dL — ABNORMAL HIGH (ref 70–99)
Glucose-Capillary: 105 mg/dL — ABNORMAL HIGH (ref 70–99)
Glucose-Capillary: 130 mg/dL — ABNORMAL HIGH (ref 70–99)

## 2021-11-02 MED ORDER — EPOETIN ALFA 4000 UNIT/ML IJ SOLN
INTRAMUSCULAR | Status: AC
  Filled 2021-11-02: qty 1

## 2021-11-02 MED ORDER — PROCHLORPERAZINE EDISYLATE 10 MG/2ML IJ SOLN
5.0000 mg | INTRAMUSCULAR | Status: AC
Start: 2021-11-02 — End: 2021-11-02
  Administered 2021-11-02: 5 mg via INTRAVENOUS
  Filled 2021-11-02: qty 1

## 2021-11-02 MED ORDER — HEPARIN SODIUM (PORCINE) 1000 UNIT/ML IJ SOLN
INTRAMUSCULAR | Status: AC
  Filled 2021-11-02: qty 10

## 2021-11-02 MED ORDER — ONDANSETRON HCL 4 MG/2ML IJ SOLN
INTRAMUSCULAR | Status: AC
  Filled 2021-11-02: qty 2

## 2021-11-02 NOTE — TOC Progression Note (Signed)
Transition of Care [facility]) - Progression Note    Patient Details  Name: Katie [name] MRN: [id number] Date of Birth: Feb 02, 1956  Transition of Care [facility]) CM/SW Contact  Anselm Pancoast, RN Phone Number: 11/02/2021, 11:35 AM  Clinical Narrative:    Sent secure email request to [facility], [place] [facility] [place], [place], and [facility] requesting review for possible placement. Facility list obtained from [employer].    Expected Discharge Plan: Skilled Nursing Facility Barriers to Discharge: SNF Pending bed offer  Expected Discharge Plan and Services Expected Discharge Plan: [place]   Discharge Planning Services: CM Consult Post Acute Care Choice: [place] Living arrangements for the past 2 months: Single Family Home                 DME Arranged: N/A DME Agency: NA       HH Arranged: NA HH Agency: NA         Social Determinants of Health (SDOH) Interventions    Readmission Risk Interventions    09/27/2021    1:44 PM 09/26/2021   12:46 PM 03/08/2021    2:49 PM  Readmission Risk Prevention Plan  Transportation Screening  Complete Complete  Palliative Care Screening Not Applicable    Medication Review (RN Care Manager)  Complete Complete  HRI or Home Care Consult   Complete  SW Recovery Care/Counseling Consult  Complete   Palliative Care Screening  Not Applicable   [place]  Not Applicable Complete

## 2021-11-02 NOTE — Progress Notes (Addendum)
PT Cancellation Note  Patient Details Name: Charrise Lardner MRN: [id number] DOB: 05/02/1956   Cancelled Treatment:    Reason Eval/Treat Not Completed: Patient at procedure or test/unavailable. PT will continue with attempts.   Addendum: Attempted after patient returned from dialysis. She refused due to pain/fatigue/nausea. She is agreeable for PT to return tomorrow.   Minna Merritts, PT, MPT  Percell Locus 11/02/2021, 1:15 PM

## 2021-11-02 NOTE — Progress Notes (Signed)
PROGRESS NOTE  Katie [name] BOF:[id number] DOB: 01/18/1956 DOA: 09/30/2021 PCP: Pcp, No  HPI/Recap of past 24 hours:  Katie [name] is a 66 y.o. female with medical history significant of ESRD, DM II, homelessness. Had been admitted 05/13-05/17 for sepsis d/t cellulits from animal bite to arm, discharged on po abx and was unable to fill the Rx, was kicked out of where she was living, no way to get HD.  Patient has been in the ED since arriving the hospital.  She was admitted to the hospital again on 10/23/2021 as patient developed cough short of breath and fever.  Chest x-ray showed pneumonia.  Patient was diagnosed with sepsis and pneumonia, was started on cefepime and vancomycin. Had MRI performed on 6/14 did not show any osteomyelitis. Patient is currently homeless, has a severe weakness.  No discharge option at this time. 6/15.  Patient has no nausea, KUB showed fecal impaction with severe dilated colon, patient was started on senna as well as scheduled lactulose.  CT chest also showed dense right lower lobe consolidation consistent with aspiration pneumonia.  Shoulder x-ray does not show any osteomyelitis versus septic joint. Patient fecal impaction resolved after Nulytely on 6/17. Patient condition has stabilized, pending nursing home placement.  11/02/21: HD today.  Nausea and vomiting reported by bedside.   Assessment/Plan: Principal Problem:   Sepsis ([place]) Active Problems:   Cellulitis   Hypertension   Type 2 diabetes mellitus ([place])   ESRD (end stage renal disease) ([place])   Financial difficulty   Anemia in chronic kidney disease   Bent bone fracture of ulna   Hospital acquired PNA   Fecal impaction ([facility])   Aortic stenosis  Sepsis. Healthcare associate pneumonia. Recent dog bites with cellulitis. Patient did not have osteomyelitis of the finger or shoulder.  He had a dense right lower lobe consolidation consistent with aspiration pneumonia. Completed 5 days antibiotics. She  continues to have crackles in the bases, modified barium swallow did not show any evidence of aspiration.  Probably due to volume from end-stage renal disease. Condition is essentially improved.  Intractable nausea and vomiting IV antiemetics as needed Unclear etiology   Fecal impaction. Patient had a massive fecal impaction with colon size up to 10 cm.  Condition resolved after giving Nulytely.   End stage renal disease on dialysis. Anemia of chronic kidney disease. Hypokalemia. Continue scheduled dialysis  Type 2 diabetes. Stable.  Essential hypertension. Continue current treatment   Mild aortic stenosis. Patient prior echocardiogram showed mild aortic stenosis, need outpatient follow-up.        Code Status: Full code  Family Communication: None at bedside  Disposition Plan: Awaiting placement.  Homeless.   Consultants: Nephrology  Procedures: Urinalysis  Antimicrobials: None.  DVT prophylaxis: Subcu heparin 3 times daily  Status is: Inpatient The patient requires at least 2 midnights for further evaluation and treatment of present condition.    Objective: Vitals:   11/02/21 1245 11/02/21 1300 11/02/21 1310 11/02/21 1354  BP: (!) 143/56 (!) 129/56  115/60  Pulse: 69 72 77 80  Resp: 18 (!) 22 (!) 26 20  Temp:   98.8 F (37.1 C) 98.7 F (37.1 C)  TempSrc:   Oral Oral  SpO2: 97% 97% 97% 96%  Weight:    83 kg  Height:        Intake/Output Summary (Last 24 hours) at 11/02/2021 1510 Last data filed at 11/02/2021 1300 Gross per 24 hour  Intake --  Output 1002 ml  Net -1002  ml   Filed Weights   10/30/21 0500 11/02/21 1354  Weight: 79.7 kg 83 kg    Exam:  General: 66 y.o. year-old female well developed well nourished in no acute distress.  Alert and oriented x3. Cardiovascular: Regular rate and rhythm with no rubs or gallops.  No thyromegaly or JVD noted.   Respiratory: Clear to auscultation with no wheezes or rales. Good inspiratory  effort. Abdomen: Soft nontender nondistended with normal bowel sounds x4 quadrants. Musculoskeletal: No lower extremity edema. 2/4 pulses in all 4 extremities. Skin: No ulcerative lesions noted or rashes, Psychiatry: Mood is appropriate for condition and setting   Data Reviewed: CBC: Recent Labs  Lab 10/29/21 0452 11/01/21 0508  WBC 9.9 9.6  HGB 8.9* 8.7*  HCT 28.8* 28.1*  MCV 94.4 95.6  PLT 284 939   Basic Metabolic Panel: Recent Labs  Lab 10/28/21 1005 10/29/21 0452  NA 136 138  K 3.5 3.8  CL 99 103  CO2 28 27  GLUCOSE 90 83  BUN 23 16  CREATININE 4.34* 3.40*  CALCIUM 8.6* 8.8*  MG  --  2.1   GFR: Estimated Creatinine Clearance: 18.4 mL/min (A) (by C-G formula based on SCr of 3.4 mg/dL (H)). Liver Function Tests: Recent Labs  Lab 10/28/21 1005  AST 23  ALT 22  ALKPHOS 102  BILITOT 0.7  PROT 6.1*  ALBUMIN 2.3*   No results for input(s): "LIPASE", "AMYLASE" in the last 168 hours. No results for input(s): "AMMONIA" in the last 168 hours. Coagulation Profile: No results for input(s): "INR", "PROTIME" in the last 168 hours. Cardiac Enzymes: No results for input(s): "CKTOTAL", "CKMB", "CKMBINDEX", "TROPONINI" in the last 168 hours. BNP (last 3 results) No results for input(s): "PROBNP" in the last 8760 hours. HbA1C: No results for input(s): "HGBA1C" in the last 72 hours. CBG: Recent Labs  Lab 11/01/21 1222 11/01/21 1532 11/01/21 2053 11/02/21 0822 11/02/21 1407  GLUCAP 118* 130* 115* 97 102*   Lipid Profile: No results for input(s): "CHOL", "HDL", "LDLCALC", "TRIG", "CHOLHDL", "LDLDIRECT" in the last 72 hours. Thyroid Function Tests: No results for input(s): "TSH", "T4TOTAL", "FREET4", "T3FREE", "THYROIDAB" in the last 72 hours. Anemia Panel: No results for input(s): "VITAMINB12", "FOLATE", "FERRITIN", "TIBC", "IRON", "RETICCTPCT" in the last 72 hours. Urine analysis:    Component Value Date/Time   COLORURINE YELLOW (A) 09/23/2021 2143    APPEARANCEUR HAZY (A) 09/23/2021 2143   LABSPEC 1.012 09/23/2021 2143   PHURINE 9.0 (H) 09/23/2021 2143   GLUCOSEU 150 (A) 09/23/2021 2143   HGBUR NEGATIVE 09/23/2021 2143   BILIRUBINUR NEGATIVE 09/23/2021 2143   KETONESUR 5 (A) 09/23/2021 2143   PROTEINUR 100 (A) 09/23/2021 2143   NITRITE NEGATIVE 09/23/2021 2143   LEUKOCYTESUR LARGE (A) 09/23/2021 2143   Sepsis Labs: @LABRCNTIP (procalcitonin:4,lacticidven:4)  ) Recent Results (from the past 240 hour(s))  SARS Coronavirus 2 by RT PCR (hospital order, performed in [place] hospital lab) *cepheid single result test* Anterior Nasal Swab     Status: None   Collection Time: 10/23/21  4:06 PM   Specimen: Anterior Nasal Swab  Result Value Ref Range Status   SARS Coronavirus 2 by RT PCR NEGATIVE NEGATIVE Final    Comment: (NOTE) SARS-CoV-2 target nucleic acids are NOT DETECTED.  The SARS-CoV-2 RNA is generally detectable in upper and lower respiratory specimens during the acute phase of infection. The lowest concentration of SARS-CoV-2 viral copies this assay can detect is 250 copies / mL. A negative result does not preclude SARS-CoV-2 infection and should  not be used as the sole basis for treatment or other patient management decisions.  A negative result may occur with improper specimen collection / handling, submission of specimen other than nasopharyngeal swab, presence of viral mutation(s) within the areas targeted by this assay, and inadequate number of viral copies (<250 copies / mL). A negative result must be combined with clinical observations, patient history, and epidemiological information.  Fact Sheet for Patients:   https://www.patel.info/  Fact Sheet for Healthcare Providers: https://Juel Ripley.com/  This test is not yet approved or  cleared by the [place] FDA and has been authorized for detection and/or diagnosis of SARS-CoV-2 by FDA under an Emergency Use Authorization  (EUA).  This EUA will remain in effect (meaning this test can be used) for the duration of the COVID-19 declaration under Section 564(b)(1) of the Act, 21 U.S.C. section 360bbb-3(b)(1), unless the authorization is terminated or revoked sooner.  Performed at [facility], [address] [place], [place] [address]   Blood culture (routine x 2)     Status: None   Collection Time: 10/23/21  4:06 PM   Specimen: BLOOD  Result Value Ref Range Status   Specimen Description BLOOD BLOOD RIGHT HAND  Final   Special Requests   Final    BOTTLES DRAWN AEROBIC AND ANAEROBIC Blood Culture adequate volume   Culture   Final    NO GROWTH 5 DAYS Performed at [facility], [address] [place], [place] [address]    Report Status 10/28/2021 FINAL  Final  Culture, blood (Routine X 2) w Reflex to ID Panel     Status: None   Collection Time: 10/24/21 12:06 AM   Specimen: BLOOD  Result Value Ref Range Status   Specimen Description BLOOD LEFT HAND  Final   Special Requests   Final    BOTTLES DRAWN AEROBIC AND ANAEROBIC Blood Culture adequate volume   Culture   Final    NO GROWTH 5 DAYS Performed at [facility], [address] [place], [place] [address]    Report Status 10/29/2021 FINAL  Final  MRSA Next Gen by PCR, Nasal     Status: None   Collection Time: 10/24/21  2:24 PM   Specimen: Nasal Mucosa; Nasal Swab  Result Value Ref Range Status   MRSA by PCR Next Gen NOT DETECTED NOT DETECTED Final    Comment: (NOTE) The GeneXpert MRSA Assay (FDA approved for NASAL specimens only), is one component of a comprehensive MRSA colonization surveillance program. It is not intended to diagnose MRSA infection nor to guide or monitor treatment for MRSA infections. Test performance is not FDA approved in patients less than 21 years old. Performed at [facility], [place], [place] [address]   Respiratory (~20 pathogens) panel by PCR     Status: None    Collection Time: 10/24/21  2:24 PM   Specimen: Nasopharyngeal Swab; Respiratory  Result Value Ref Range Status   Adenovirus NOT DETECTED NOT DETECTED Final   Coronavirus 229E NOT DETECTED NOT DETECTED Final    Comment: (NOTE) The Coronavirus on the Respiratory Panel, DOES NOT test for the novel  Coronavirus (2019 nCoV)    Coronavirus HKU1 NOT DETECTED NOT DETECTED Final   Coronavirus NL63 NOT DETECTED NOT DETECTED Final   Coronavirus OC43 NOT DETECTED NOT DETECTED Final   Metapneumovirus NOT DETECTED NOT DETECTED Final   Rhinovirus / Enterovirus NOT DETECTED NOT DETECTED Final   Influenza A NOT DETECTED NOT DETECTED Final  Influenza B NOT DETECTED NOT DETECTED Final   Parainfluenza Virus 1 NOT DETECTED NOT DETECTED Final   Parainfluenza Virus 2 NOT DETECTED NOT DETECTED Final   Parainfluenza Virus 3 NOT DETECTED NOT DETECTED Final   Parainfluenza Virus 4 NOT DETECTED NOT DETECTED Final   Respiratory Syncytial Virus NOT DETECTED NOT DETECTED Final   Bordetella pertussis NOT DETECTED NOT DETECTED Final   Bordetella Parapertussis NOT DETECTED NOT DETECTED Final   Chlamydophila pneumoniae NOT DETECTED NOT DETECTED Final   Mycoplasma pneumoniae NOT DETECTED NOT DETECTED Final    Comment: Performed at [facility], [place] [address] [place], [place] [address]      Studies: No results found.  Scheduled Meds:  calcium acetate  667 mg Oral TID WC   carvedilol  12.5 mg Oral QPM   Chlorhexidine Gluconate Cloth  6 each Topical Q0600   epoetin alfa       epoetin (EPOGEN/PROCRIT) injection  4,000 Units Intravenous Q T,Th,Sa-HD   feeding supplement (NEPRO CARB STEADY)  237 mL Oral TID BM   heparin  5,000 Units Subcutaneous Q12H   heparin sodium (porcine)       insulin aspart  0-15 Units Subcutaneous TID WC   lactulose  20 g Oral BID   lubiprostone  24 mcg Oral BID WC   metoCLOPramide (REGLAN) injection  10 mg Intravenous Once   multivitamin  1 tablet Oral QHS   ondansetron        pantoprazole  40 mg Oral BID   prochlorperazine  5 mg Intravenous STAT   scopolamine  1 patch Transdermal Q72H   sodium chloride flush  3 mL Intravenous Q12H    Continuous Infusions:  promethazine (PHENERGAN) injection (IM or IVPB)       LOS: 10 days     Kayleen Memos, MD [facility] Pager [phone]  If 7PM-7AM, please contact night-coverage [email or web address] Password [email or web address] 11/02/2021, 3:10 PM

## 2021-11-02 NOTE — TOC Progression Note (Signed)
Transition of Care [facility]) - Progression Note    Patient Details  Name: Katie [name] MRN: [id number] Date of Birth: 09/21/1955  Transition of Care [facility]) CM/SW [place], [place] Phone Number: 11/02/2021, 11:54 AM  Clinical Narrative:     CSW has emailed patient's clinicals to [place] at [email or web address] with DSS for her to follow up with [facility] and other facilities for placement.   Expected Discharge Plan: Skilled Nursing Facility Barriers to Discharge: SNF Pending bed offer  Expected Discharge Plan and Services Expected Discharge Plan: [place]   Discharge Planning Services: CM Consult Post Acute Care Choice: [place] Living arrangements for the past 2 months: Single Family Home                 DME Arranged: N/A DME Agency: NA       HH Arranged: NA HH Agency: NA         Social Determinants of Health (SDOH) Interventions    Readmission Risk Interventions    09/27/2021    1:44 PM 09/26/2021   12:46 PM 03/08/2021    2:49 PM  Readmission Risk Prevention Plan  Transportation Screening  Complete Complete  Palliative Care Screening Not Applicable    Medication Review (RN Care Manager)  Complete Complete  HRI or Home Care Consult   Complete  SW Recovery Care/Counseling Consult  Complete   Palliative Care Screening  Not Applicable   [place]  Not Applicable Complete

## 2021-11-02 NOTE — Progress Notes (Signed)
OT Cancellation Note  Patient Details Name: Katie [name] MRN: [id number] DOB: 1955-11-16   Cancelled Treatment:    Reason Eval/Treat Not Completed: Other (comment) (pt at HD, OT will re-attempt as pt is available) Shanon Payor, OTD OTR/L  11/02/21, 12:50 PM

## 2021-11-02 NOTE — Plan of Care (Signed)

## 2021-11-02 NOTE — Progress Notes (Signed)
[facility]  Dialysis Note   Subjective:   Patient seen and evaluated during dialysis   HEMODIALYSIS FLOWSHEET:  Blood Flow Rate (mL/min): 400 mL/min Arterial Pressure (mmHg): -180 mmHg Venous Pressure (mmHg): 130 mmHg Transmembrane Pressure (mmHg): 70 mmHg Ultrafiltration Rate (mL/min): 500 mL/min Dialysate Flow Rate (mL/min): 500 ml/min Conductivity: 13.8 Conductivity: 13.8 Dialysis Fluid Bolus: Normal Saline Bolus Amount (mL): 250 mL  Patient seated in recliner for treatment Tolerating well  Objective:  Vital signs in last 24 hours:  Temp:  [98 F (36.7 C)-99.2 F (37.3 C)] 98.8 F (37.1 C) (06/22 1310) Pulse Rate:  [69-81] 77 (06/22 1310) Resp:  [14-26] 26 (06/22 1310) BP: (113-149)/(51-64) 143/56 (06/22 1245) SpO2:  [93 %-98 %] 97 % (06/22 1310)  Weight change:  Filed Weights   10/28/21 1237 10/29/21 0500 10/30/21 0500  Weight: 84.2 kg 79.7 kg 79.7 kg    Intake/Output: I/O last 3 completed shifts: In: 240 [P.O.:240] Out: 2 [Urine:2]   Intake/Output this shift:  Total I/O In: -  Out: 1000 [Other:1000]  Physical Exam: General: NAD  Head: Normocephalic, atraumatic. Moist oral mucosal membranes  Eyes: Anicteric  Lungs:  Clear to auscultation, normal effort  Heart: Regular rate and rhythm  Abdomen:  Soft, nontender  Extremities: No peripheral edema.  Neurologic: Nonfocal, moving all four extremities  Skin: No lesions  Access: Right PermCath    Basic Metabolic Panel: Recent Labs  Lab 10/28/21 1005 10/29/21 0452  NA 136 138  K 3.5 3.8  CL 99 103  CO2 28 27  GLUCOSE 90 83  BUN 23 16  CREATININE 4.34* 3.40*  CALCIUM 8.6* 8.8*  MG  --  2.1     Liver Function Tests: Recent Labs  Lab 10/28/21 1005  AST 23  ALT 22  ALKPHOS 102  BILITOT 0.7  PROT 6.1*  ALBUMIN 2.3*    No results for input(s): "LIPASE", "AMYLASE" in the last 168 hours. No results for input(s): "AMMONIA" in the last 168 hours.  CBC: Recent Labs  Lab  10/29/21 0452 11/01/21 0508  WBC 9.9 9.6  HGB 8.9* 8.7*  HCT 28.8* 28.1*  MCV 94.4 95.6  PLT 284 304     Cardiac Enzymes: No results for input(s): "CKTOTAL", "CKMB", "CKMBINDEX", "TROPONINI" in the last 168 hours.  BNP: Invalid input(s): "POCBNP"  CBG: Recent Labs  Lab 11/01/21 0827 11/01/21 1222 11/01/21 1532 11/01/21 2053 11/02/21 0822  GLUCAP 104* 118* 130* 115* 97     Microbiology: Results for orders placed or performed during the hospital encounter of 09/30/21  Resp Panel by RT-PCR (Flu A&B, Covid) Nasopharyngeal Swab     Status: None   Collection Time: 09/30/21  9:34 AM   Specimen: Nasopharyngeal Swab; Nasopharyngeal(NP) swabs in vial transport medium  Result Value Ref Range Status   SARS Coronavirus 2 by RT PCR NEGATIVE NEGATIVE Final    Comment: (NOTE) SARS-CoV-2 target nucleic acids are NOT DETECTED.  The SARS-CoV-2 RNA is generally detectable in upper respiratory specimens during the acute phase of infection. The lowest concentration of SARS-CoV-2 viral copies this assay can detect is 138 copies/mL. A negative result does not preclude SARS-Cov-2 infection and should not be used as the sole basis for treatment or other patient management decisions. A negative result may occur with  improper specimen collection/handling, submission of specimen other than nasopharyngeal swab, presence of viral mutation(s) within the areas targeted by this assay, and inadequate number of viral copies(<138 copies/mL). A negative result must be combined with clinical observations,  patient history, and epidemiological information. The expected result is Negative.  Fact Sheet for Patients:  EntrepreneurPulse.com.au  Fact Sheet for Healthcare Providers:  IncredibleEmployment.be  This test is no t yet approved or cleared by the [place] FDA and  has been authorized for detection and/or diagnosis of SARS-CoV-2 by FDA under an Emergency  Use Authorization (EUA). This EUA will remain  in effect (meaning this test can be used) for the duration of the COVID-19 declaration under Section 564(b)(1) of the Act, 21 U.S.C.section 360bbb-3(b)(1), unless the authorization is terminated  or revoked sooner.       Influenza A by PCR NEGATIVE NEGATIVE Final   Influenza B by PCR NEGATIVE NEGATIVE Final    Comment: (NOTE) The Xpert Xpress SARS-CoV-2/FLU/RSV plus assay is intended as an aid in the diagnosis of influenza from Nasopharyngeal swab specimens and should not be used as a sole basis for treatment. Nasal washings and aspirates are unacceptable for Xpert Xpress SARS-CoV-2/FLU/RSV testing.  Fact Sheet for Patients: EntrepreneurPulse.com.au  Fact Sheet for Healthcare Providers: IncredibleEmployment.be  This test is not yet approved or cleared by the [place] FDA and has been authorized for detection and/or diagnosis of SARS-CoV-2 by FDA under an Emergency Use Authorization (EUA). This EUA will remain in effect (meaning this test can be used) for the duration of the COVID-19 declaration under Section 564(b)(1) of the Act, 21 U.S.C. section 360bbb-3(b)(1), unless the authorization is terminated or revoked.  Performed at [facility], [address] [place], [place] [address]   SARS Coronavirus 2 by RT PCR (hospital order, performed in [facility] hospital lab) *cepheid single result test* Anterior Nasal Swab     Status: None   Collection Time: 10/23/21  4:06 PM   Specimen: Anterior Nasal Swab  Result Value Ref Range Status   SARS Coronavirus 2 by RT PCR NEGATIVE NEGATIVE Final    Comment: (NOTE) SARS-CoV-2 target nucleic acids are NOT DETECTED.  The SARS-CoV-2 RNA is generally detectable in upper and lower respiratory specimens during the acute phase of infection. The lowest concentration of SARS-CoV-2 viral copies this assay can detect is 250 copies / mL. A negative  result does not preclude SARS-CoV-2 infection and should not be used as the sole basis for treatment or other patient management decisions.  A negative result may occur with improper specimen collection / handling, submission of specimen other than nasopharyngeal swab, presence of viral mutation(s) within the areas targeted by this assay, and inadequate number of viral copies (<250 copies / mL). A negative result must be combined with clinical observations, patient history, and epidemiological information.  Fact Sheet for Patients:   https://www.patel.info/  Fact Sheet for Healthcare Providers: https://hall.com/  This test is not yet approved or  cleared by the [place] FDA and has been authorized for detection and/or diagnosis of SARS-CoV-2 by FDA under an Emergency Use Authorization (EUA).  This EUA will remain in effect (meaning this test can be used) for the duration of the COVID-19 declaration under Section 564(b)(1) of the Act, 21 U.S.C. section 360bbb-3(b)(1), unless the authorization is terminated or revoked sooner.  Performed at [facility], [address] [place], [place] [address]   Blood culture (routine x 2)     Status: None   Collection Time: 10/23/21  4:06 PM   Specimen: BLOOD  Result Value Ref Range Status   Specimen Description BLOOD BLOOD RIGHT HAND  Final   Special Requests   Final    BOTTLES DRAWN AEROBIC  AND ANAEROBIC Blood Culture adequate volume   Culture   Final    NO GROWTH 5 DAYS Performed at [facility], [address] [place], [place] [address]    Report Status 10/28/2021 FINAL  Final  Culture, blood (Routine X 2) w Reflex to ID Panel     Status: None   Collection Time: 10/24/21 12:06 AM   Specimen: BLOOD  Result Value Ref Range Status   Specimen Description BLOOD LEFT HAND  Final   Special Requests   Final    BOTTLES DRAWN AEROBIC AND ANAEROBIC Blood Culture adequate volume    Culture   Final    NO GROWTH 5 DAYS Performed at [facility], [address], [place], [place] [address]    Report Status 10/29/2021 FINAL  Final  MRSA Next Gen by PCR, Nasal     Status: None   Collection Time: 10/24/21  2:24 PM   Specimen: Nasal Mucosa; Nasal Swab  Result Value Ref Range Status   MRSA by PCR Next Gen NOT DETECTED NOT DETECTED Final    Comment: (NOTE) The GeneXpert MRSA Assay (FDA approved for NASAL specimens only), is one component of a comprehensive MRSA colonization surveillance program. It is not intended to diagnose MRSA infection nor to guide or monitor treatment for MRSA infections. Test performance is not FDA approved in patients less than 86 years old. Performed at [facility], [place], [place] [address]   Respiratory (~20 pathogens) panel by PCR     Status: None   Collection Time: 10/24/21  2:24 PM   Specimen: Nasopharyngeal Swab; Respiratory  Result Value Ref Range Status   Adenovirus NOT DETECTED NOT DETECTED Final   Coronavirus 229E NOT DETECTED NOT DETECTED Final    Comment: (NOTE) The Coronavirus on the Respiratory Panel, DOES NOT test for the novel  Coronavirus (2019 nCoV)    Coronavirus HKU1 NOT DETECTED NOT DETECTED Final   Coronavirus NL63 NOT DETECTED NOT DETECTED Final   Coronavirus OC43 NOT DETECTED NOT DETECTED Final   Metapneumovirus NOT DETECTED NOT DETECTED Final   Rhinovirus / Enterovirus NOT DETECTED NOT DETECTED Final   Influenza A NOT DETECTED NOT DETECTED Final   Influenza B NOT DETECTED NOT DETECTED Final   Parainfluenza Virus 1 NOT DETECTED NOT DETECTED Final   Parainfluenza Virus 2 NOT DETECTED NOT DETECTED Final   Parainfluenza Virus 3 NOT DETECTED NOT DETECTED Final   Parainfluenza Virus 4 NOT DETECTED NOT DETECTED Final   Respiratory Syncytial Virus NOT DETECTED NOT DETECTED Final   Bordetella pertussis NOT DETECTED NOT DETECTED Final   Bordetella Parapertussis NOT DETECTED NOT  DETECTED Final   Chlamydophila pneumoniae NOT DETECTED NOT DETECTED Final   Mycoplasma pneumoniae NOT DETECTED NOT DETECTED Final    Comment: Performed at [facility], [place]. [address] [place], [place] [address]    Coagulation Studies: No results for input(s): "LABPROT", "INR" in the last 72 hours.  Urinalysis: No results for input(s): "COLORURINE", "LABSPEC", "PHURINE", "GLUCOSEU", "HGBUR", "BILIRUBINUR", "KETONESUR", "PROTEINUR", "UROBILINOGEN", "NITRITE", "LEUKOCYTESUR" in the last 72 hours.  Invalid input(s): "APPERANCEUR"    Imaging: No results found.   Medications:    promethazine (PHENERGAN) injection (IM or IVPB)      calcium acetate  667 mg Oral TID WC   carvedilol  12.5 mg Oral QPM   Chlorhexidine Gluconate Cloth  6 each Topical Q0600   epoetin alfa       epoetin (EPOGEN/PROCRIT) injection  4,000 Units Intravenous Q  T,Th,Sa-HD   feeding supplement (NEPRO CARB STEADY)  237 mL Oral TID BM   heparin  5,000 Units Subcutaneous Q12H   heparin sodium (porcine)       insulin aspart  0-15 Units Subcutaneous TID WC   lactulose  20 g Oral BID   lubiprostone  24 mcg Oral BID WC   metoCLOPramide (REGLAN) injection  10 mg Intravenous Once   multivitamin  1 tablet Oral QHS   ondansetron       pantoprazole  40 mg Oral BID   scopolamine  1 patch Transdermal Q72H   sodium chloride flush  3 mL Intravenous Q12H   acetaminophen **OR** acetaminophen, epoetin alfa, guaiFENesin, heparin sodium (porcine), ondansetron, ondansetron (ZOFRAN) IV, oxyCODONE-acetaminophen, tiZANidine, topiramate  Assessment/ Plan:  [name] is a 66 y.o.  female with past medical history of ESRD with hemodialysis TTS, diabetes mellitus, hypertension to the hospital seeking long term placement. She was recently admitted and received antibiotics for a dog bite.  She was discharged with a prescription for home antibiotics.  She states her caregivers were unable to retrieve antibiotics.  She states  that there was an argument and that she is here seeking long-term placement.  Principal Problem:   Sepsis ([place]) Active Problems:   Hypertension   Type 2 diabetes mellitus ([place])   ESRD (end stage renal disease) ([place])   Financial difficulty   Anemia in chronic kidney disease   Cellulitis   Bent bone fracture of ulna   Hospital acquired PNA   Fecal impaction ([facility])   Aortic stenosis   [facility]/TTS/right PermCath  End Stage Renal Disease on hemodialysis:  Currently on TTS schedule. Receiving treatment, UF goal 1L as tolerated. Requested nursing obtain standing weight after treatment.  Will continue to perform dialysis with patient in chair to maintain tolerance.  Next treatment scheduled for Thursday  No results found for: "POTASSIUM"  Intake/Output Summary (Last 24 hours) at 11/02/2021 1313 Last data filed at 11/02/2021 1300 Gross per 24 hour  Intake --  Output 1002 ml  Net -1002 ml     2. Hypertension with chronic kidney disease:   Remains on carvedilol every evening Blood pressure 153/59 during treatment   BP (!) 143/56   Pulse 77   Temp 98.8 F (37.1 C) (Oral)   Resp (!) 26   Ht 5\' 6"  (1.676 m)   Wt 79.7 kg   SpO2 97%   BMI 28.36 kg/m   3. Anemia of chronic kidney disease/ kidney injury/chronic disease/acute blood loss:   Lab Results  Component Value Date   HGB 8.7 (L) 11/01/2021  Will obtain labs in am Receiving low-dose EPO with dialysis treatments.  4. Secondary Hyperparathyroidism:     Lab Results  Component Value Date   PTH 70 (H) 05/12/2020   CALCIUM 8.8 (L) 10/29/2021   PHOS 3.0 10/24/2021   We will continue to monitor bone minerals during this admission. Continue calcium acetate with meals  5.  Severe constipation Bowel regimen of Dulcolax, lactulose, Nulytely Large BM on 6/18 Patient encouraged to increase mobility and manage pain with non-narcotics to decrease reoccurrence.     LOS: [facility] 6/22/20231:13 PM

## 2021-11-03 DIAGNOSIS — A419 Sepsis, unspecified organism: Secondary | ICD-10-CM | POA: Diagnosis not present

## 2021-11-03 LAB — RENAL FUNCTION PANEL
Albumin: 2.4 g/dL — ABNORMAL LOW (ref 3.5–5.0)
Anion gap: 6 (ref 5–15)
BUN: 20 mg/dL (ref 8–23)
CO2: 31 mmol/L (ref 22–32)
Calcium: 8.8 mg/dL — ABNORMAL LOW (ref 8.9–10.3)
Chloride: 99 mmol/L (ref 98–111)
Creatinine, Ser: 3.37 mg/dL — ABNORMAL HIGH (ref 0.44–1.00)
GFR, Estimated: 15 mL/min — ABNORMAL LOW (ref >60)
Glucose, Bld: 124 mg/dL — ABNORMAL HIGH (ref 70–99)
Phosphorus: 1.8 mg/dL — ABNORMAL LOW (ref 2.5–4.6)
Potassium: 4.1 mmol/L (ref 3.5–5.1)
Sodium: 136 mmol/L (ref 135–145)

## 2021-11-03 LAB — GLUCOSE, CAPILLARY
Glucose-Capillary: 94 mg/dL (ref 70–99)
Glucose-Capillary: 133 mg/dL — ABNORMAL HIGH (ref 70–99)
Glucose-Capillary: 127 mg/dL — ABNORMAL HIGH (ref 70–99)
Glucose-Capillary: 86 mg/dL (ref 70–99)

## 2021-11-03 LAB — CBC
HCT: 29.4 % — ABNORMAL LOW (ref 36.0–46.0)
Hemoglobin: 8.9 g/dL — ABNORMAL LOW (ref 12.0–15.0)
MCH: 29.1 pg (ref 26.0–34.0)
MCHC: 30.3 g/dL (ref 30.0–36.0)
MCV: 96.1 fL (ref 80.0–100.0)
Platelets: 292 10*3/uL (ref 150–400)
RBC: 3.06 MIL/uL — ABNORMAL LOW (ref 3.87–5.11)
RDW: 15.2 % (ref 11.5–15.5)
WBC: 9.8 10*3/uL (ref 4.0–10.5)
nRBC: 0 % (ref 0.0–0.2)

## 2021-11-03 MED ORDER — FUROSEMIDE 40 MG PO TABS
40.0000 mg | ORAL_TABLET | Freq: Every day | ORAL | Status: DC
  Administered 2021-11-03 – 2021-11-27 (×23): 40 mg via ORAL
  Filled 2021-11-03 (×23): qty 1

## 2021-11-04 DIAGNOSIS — A419 Sepsis, unspecified organism: Secondary | ICD-10-CM | POA: Diagnosis not present

## 2021-11-04 LAB — HEPATITIS C ANTIBODY: HCV Ab: NONREACTIVE

## 2021-11-04 LAB — GLUCOSE, CAPILLARY
Glucose-Capillary: 117 mg/dL — ABNORMAL HIGH (ref 70–99)
Glucose-Capillary: 115 mg/dL — ABNORMAL HIGH (ref 70–99)
Glucose-Capillary: 117 mg/dL — ABNORMAL HIGH (ref 70–99)
Glucose-Capillary: 108 mg/dL — ABNORMAL HIGH (ref 70–99)

## 2021-11-04 LAB — HEPATITIS B SURFACE ANTIBODY,QUALITATIVE: Hep B S Ab: NONREACTIVE

## 2021-11-04 LAB — HEPATITIS B CORE ANTIBODY, TOTAL: Hep B Core Total Ab: NONREACTIVE

## 2021-11-04 LAB — HEPATITIS B SURFACE ANTIGEN: Hepatitis B Surface Ag: NONREACTIVE

## 2021-11-04 MED ORDER — EPOETIN ALFA 4000 UNIT/ML IJ SOLN
INTRAMUSCULAR | Status: AC
  Filled 2021-11-04: qty 1

## 2021-11-04 MED ORDER — ONDANSETRON HCL 4 MG/2ML IJ SOLN
INTRAMUSCULAR | Status: AC
  Administered 2021-11-04: 4 mg via INTRAVENOUS
  Filled 2021-11-04: qty 2

## 2021-11-04 MED ORDER — HEPARIN SODIUM (PORCINE) 1000 UNIT/ML IJ SOLN
INTRAMUSCULAR | Status: AC
  Administered 2021-11-04: 4200 [IU]
  Filled 2021-11-04: qty 10

## 2021-11-04 MED ORDER — SENNOSIDES-DOCUSATE SODIUM 8.6-50 MG PO TABS
2.0000 | ORAL_TABLET | Freq: Two times a day (BID) | ORAL | Status: DC
  Administered 2021-11-04 – 2021-11-14 (×12): 2 via ORAL
  Filled 2021-11-04 (×13): qty 2

## 2021-11-04 MED ORDER — BISACODYL 10 MG RE SUPP
10.0000 mg | Freq: Once | RECTAL | Status: DC
  Filled 2021-11-04: qty 1

## 2021-11-04 MED ORDER — OXYCODONE-ACETAMINOPHEN 5-325 MG PO TABS
ORAL_TABLET | ORAL | Status: AC
  Administered 2021-11-04: 1 via ORAL
  Filled 2021-11-04: qty 1

## 2021-11-04 NOTE — Progress Notes (Signed)
[facility]  Dialysis Note   Subjective:   Patient was seen today on dialysis unit We will plan to dialyze patient today thereafter transition patient to Monday Wednesday Friday schedule Patient offers no new specific physical complaint Patient is tolerating treatment well  Objective:  Vital signs in last 24 hours:  Temp:  [98.4 F (36.9 C)-98.5 F (36.9 C)] 98.5 F (36.9 C) (06/24 0734) Pulse Rate:  [75-77] 75 (06/24 0734) Resp:  [15-18] 15 (06/24 0734) BP: (114-147)/(55-74) 147/66 (06/24 0734) SpO2:  [88 %-98 %] 96 % (06/24 0734) Weight:  [86 kg] 86 kg (06/24 0500)  Weight change: 3 kg Filed Weights   11/02/21 1354 11/03/21 0500 11/04/21 0500  Weight: 83 kg 86.2 kg 86 kg    Intake/Output: I/O last 3 completed shifts: In: 606 [P.O.:600; I.V.:6] Out: -    Intake/Output this shift:  No intake/output data recorded.  Physical Exam: General: NAD  Head: Normocephalic, atraumatic. Moist oral mucosal membranes  Eyes: Anicteric  Lungs:  Clear to auscultation, normal effort  Heart: Regular rate and rhythm  Abdomen:  Soft, nontender  Extremities: No peripheral edema.  Neurologic: Nonfocal, moving all four extremities  Skin: No lesions  Access: Right PermCath    Basic Metabolic Panel: Recent Labs  Lab 10/28/21 1005 10/29/21 0452 11/03/21 1153  NA 136 138 136  K 3.5 3.8 4.1  CL 99 103 99  CO2 28 27 31   GLUCOSE 90 83 124*  BUN 23 16 20   CREATININE 4.34* 3.40* 3.37*  CALCIUM 8.6* 8.8* 8.8*  MG  --  2.1  --   PHOS  --   --  1.8*    Liver Function Tests: Recent Labs  Lab 10/28/21 1005 11/03/21 1153  AST 23  --   ALT 22  --   ALKPHOS 102  --   BILITOT 0.7  --   PROT 6.1*  --   ALBUMIN 2.3* 2.4*   No results for input(s): "LIPASE", "AMYLASE" in the last 168 hours. No results for input(s): "AMMONIA" in the last 168 hours.  CBC: Recent Labs  Lab 10/29/21 0452 11/01/21 0508 11/03/21 1153  WBC 9.9 9.6 9.8  HGB 8.9* 8.7* 8.9*  HCT 28.8*  28.1* 29.4*  MCV 94.4 95.6 96.1  PLT 284 304 292    Cardiac Enzymes: No results for input(s): "CKTOTAL", "CKMB", "CKMBINDEX", "TROPONINI" in the last 168 hours.  BNP: Invalid input(s): "POCBNP"  CBG: Recent Labs  Lab 11/03/21 0719 11/03/21 1226 11/03/21 1638 11/03/21 2102 11/04/21 0736  GLUCAP 94 133* 127* 86 117*    Microbiology: Results for orders placed or performed during the hospital encounter of 09/30/21  Resp Panel by RT-PCR (Flu A&B, Covid) Nasopharyngeal Swab     Status: None   Collection Time: 09/30/21  9:34 AM   Specimen: Nasopharyngeal Swab; Nasopharyngeal(NP) swabs in vial transport medium  Result Value Ref Range Status   SARS Coronavirus 2 by RT PCR NEGATIVE NEGATIVE Final    Comment: (NOTE) SARS-CoV-2 target nucleic acids are NOT DETECTED.  The SARS-CoV-2 RNA is generally detectable in upper respiratory specimens during the acute phase of infection. The lowest concentration of SARS-CoV-2 viral copies this assay can detect is 138 copies/mL. A negative result does not preclude SARS-Cov-2 infection and should not be used as the sole basis for treatment or other patient management decisions. A negative result may occur with  improper specimen collection/handling, submission of specimen other than nasopharyngeal swab, presence of viral mutation(s) within the areas targeted by this  assay, and inadequate number of viral copies(<138 copies/mL). A negative result must be combined with clinical observations, patient history, and epidemiological information. The expected result is Negative.  Fact Sheet for Patients:  BloggerCourse.com  Fact Sheet for Healthcare Providers:  SeriousBroker.it  This test is no t yet approved or cleared by the [place] FDA and  has been authorized for detection and/or diagnosis of SARS-CoV-2 by FDA under an Emergency Use Authorization (EUA). This EUA will remain  in effect  (meaning this test can be used) for the duration of the COVID-19 declaration under Section 564(b)(1) of the Act, 21 U.S.C.section 360bbb-3(b)(1), unless the authorization is terminated  or revoked sooner.       Influenza A by PCR NEGATIVE NEGATIVE Final   Influenza B by PCR NEGATIVE NEGATIVE Final    Comment: (NOTE) The Xpert Xpress SARS-CoV-2/FLU/RSV plus assay is intended as an aid in the diagnosis of influenza from Nasopharyngeal swab specimens and should not be used as a sole basis for treatment. Nasal washings and aspirates are unacceptable for Xpert Xpress SARS-CoV-2/FLU/RSV testing.  Fact Sheet for Patients: BloggerCourse.com  Fact Sheet for Healthcare Providers: SeriousBroker.it  This test is not yet approved or cleared by the [place] FDA and has been authorized for detection and/or diagnosis of SARS-CoV-2 by FDA under an Emergency Use Authorization (EUA). This EUA will remain in effect (meaning this test can be used) for the duration of the COVID-19 declaration under Section 564(b)(1) of the Act, 21 U.S.C. section 360bbb-3(b)(1), unless the authorization is terminated or revoked.  Performed at [facility], [address] [place], [place] [address]   SARS Coronavirus 2 by RT PCR (hospital order, performed in [facility] hospital lab) *cepheid single result test* Anterior Nasal Swab     Status: None   Collection Time: 10/23/21  4:06 PM   Specimen: Anterior Nasal Swab  Result Value Ref Range Status   SARS Coronavirus 2 by RT PCR NEGATIVE NEGATIVE Final    Comment: (NOTE) SARS-CoV-2 target nucleic acids are NOT DETECTED.  The SARS-CoV-2 RNA is generally detectable in upper and lower respiratory specimens during the acute phase of infection. The lowest concentration of SARS-CoV-2 viral copies this assay can detect is 250 copies / mL. A negative result does not preclude SARS-CoV-2 infection and should  not be used as the sole basis for treatment or other patient management decisions.  A negative result may occur with improper specimen collection / handling, submission of specimen other than nasopharyngeal swab, presence of viral mutation(s) within the areas targeted by this assay, and inadequate number of viral copies (<250 copies / mL). A negative result must be combined with clinical observations, patient history, and epidemiological information.  Fact Sheet for Patients:   RoadLapTop.co.za  Fact Sheet for Healthcare Providers: http://kim-miller.com/  This test is not yet approved or  cleared by the [place] FDA and has been authorized for detection and/or diagnosis of SARS-CoV-2 by FDA under an Emergency Use Authorization (EUA).  This EUA will remain in effect (meaning this test can be used) for the duration of the COVID-19 declaration under Section 564(b)(1) of the Act, 21 U.S.C. section 360bbb-3(b)(1), unless the authorization is terminated or revoked sooner.  Performed at [facility], [address] [place], [place] [address]   Blood culture (routine x 2)     Status: None   Collection Time: 10/23/21  4:06 PM   Specimen: BLOOD  Result Value Ref Range Status   Specimen Description BLOOD BLOOD  RIGHT HAND  Final   Special Requests   Final    BOTTLES DRAWN AEROBIC AND ANAEROBIC Blood Culture adequate volume   Culture   Final    NO GROWTH 5 DAYS Performed at [facility], [address] [place], [place] [address]    Report Status 10/28/2021 FINAL  Final  Culture, blood (Routine X 2) w Reflex to ID Panel     Status: None   Collection Time: 10/24/21 12:06 AM   Specimen: BLOOD  Result Value Ref Range Status   Specimen Description BLOOD LEFT HAND  Final   Special Requests   Final    BOTTLES DRAWN AEROBIC AND ANAEROBIC Blood Culture adequate volume   Culture   Final    NO GROWTH 5 DAYS Performed at  [facility], [address], [place], [place] [address]    Report Status 10/29/2021 FINAL  Final  MRSA Next Gen by PCR, Nasal     Status: None   Collection Time: 10/24/21  2:24 PM   Specimen: Nasal Mucosa; Nasal Swab  Result Value Ref Range Status   MRSA by PCR Next Gen NOT DETECTED NOT DETECTED Final    Comment: (NOTE) The GeneXpert MRSA Assay (FDA approved for NASAL specimens only), is one component of a comprehensive MRSA colonization surveillance program. It is not intended to diagnose MRSA infection nor to guide or monitor treatment for MRSA infections. Test performance is not FDA approved in patients less than 19 years old. Performed at [facility], [address] [place], [place] [address]   Respiratory (~20 pathogens) panel by PCR     Status: None   Collection Time: 10/24/21  2:24 PM   Specimen: Nasopharyngeal Swab; Respiratory  Result Value Ref Range Status   Adenovirus NOT DETECTED NOT DETECTED Final   Coronavirus 229E NOT DETECTED NOT DETECTED Final    Comment: (NOTE) The Coronavirus on the Respiratory Panel, DOES NOT test for the novel  Coronavirus (2019 nCoV)    Coronavirus HKU1 NOT DETECTED NOT DETECTED Final   Coronavirus NL63 NOT DETECTED NOT DETECTED Final   Coronavirus OC43 NOT DETECTED NOT DETECTED Final   Metapneumovirus NOT DETECTED NOT DETECTED Final   Rhinovirus / Enterovirus NOT DETECTED NOT DETECTED Final   Influenza A NOT DETECTED NOT DETECTED Final   Influenza B NOT DETECTED NOT DETECTED Final   Parainfluenza Virus 1 NOT DETECTED NOT DETECTED Final   Parainfluenza Virus 2 NOT DETECTED NOT DETECTED Final   Parainfluenza Virus 3 NOT DETECTED NOT DETECTED Final   Parainfluenza Virus 4 NOT DETECTED NOT DETECTED Final   Respiratory Syncytial Virus NOT DETECTED NOT DETECTED Final   Bordetella pertussis NOT DETECTED NOT DETECTED Final   Bordetella Parapertussis NOT DETECTED NOT DETECTED Final   Chlamydophila pneumoniae NOT DETECTED  NOT DETECTED Final   Mycoplasma pneumoniae NOT DETECTED NOT DETECTED Final    Comment: Performed at [facility], [address] [place], [place] [address]    Coagulation Studies: No results for input(s): "LABPROT", "INR" in the last 72 hours.  Urinalysis: No results for input(s): "COLORURINE", "LABSPEC", "PHURINE", "GLUCOSEU", "HGBUR", "BILIRUBINUR", "KETONESUR", "PROTEINUR", "UROBILINOGEN", "NITRITE", "LEUKOCYTESUR" in the last 72 hours.  Invalid input(s): "APPERANCEUR"    Imaging: No results found.   Medications:    promethazine (PHENERGAN) injection (IM or IVPB)      carvedilol  12.5 mg Oral QPM   Chlorhexidine Gluconate Cloth  6 each Topical Q0600   epoetin (EPOGEN/PROCRIT) injection  4,000 Units Intravenous Q T,Th,Sa-HD  feeding supplement (NEPRO CARB STEADY)  237 mL Oral TID BM   furosemide  40 mg Oral Daily   heparin  5,000 Units Subcutaneous Q12H   insulin aspart  0-15 Units Subcutaneous TID WC   lactulose  20 g Oral BID   lubiprostone  24 mcg Oral BID WC   metoCLOPramide (REGLAN) injection  10 mg Intravenous Once   multivitamin  1 tablet Oral QHS   pantoprazole  40 mg Oral BID   scopolamine  1 patch Transdermal Q72H   senna-docusate  2 tablet Oral BID   sodium chloride flush  3 mL Intravenous Q12H   acetaminophen **OR** acetaminophen, guaiFENesin, ondansetron (ZOFRAN) IV, oxyCODONE-acetaminophen, tiZANidine, topiramate  Assessment/ Plan:  [name] is a 66 y.o.  female with past medical history of ESRD with hemodialysis TTS, diabetes mellitus, hypertension to the hospital seeking long term placement. She was recently admitted and received antibiotics for a dog bite.  She was discharged with a prescription for home antibiotics.  She states her caregivers were unable to retrieve antibiotics.  She states that there was an argument and that she is here seeking long-term placement.  Principal Problem:   Sepsis (HCC) Active Problems:   Hypertension    Type 2 diabetes mellitus (HCC)   ESRD (end stage renal disease) (HCC)   Financial difficulty   Anemia in chronic kidney disease   Cellulitis   Bent bone fracture of ulna   Hospital acquired PNA   Fecal impaction (HCC)   Aortic stenosis   [facility]/MWF/right PermCath  End Stage Renal Disease on hemodialysis:  We will dialyze patient today. We will most likely plan for next treatment for Monday Due to discharge plan including rehab, we will begin to dialyze patient on a MWF schedule      No results found for: "POTASSIUM"  Intake/Output Summary (Last 24 hours) at 11/04/2021 0828 Last data filed at 11/03/2021 2132 Gross per 24 hour  Intake 603 ml  Output --  Net 603 ml    2. Hypertension with chronic kidney disease:   Carvedilol every evening   BP (!) 147/66 (BP Location: Right Arm)   Pulse 75   Temp 98.5 F (36.9 C)   Resp 15   Ht 5\' 6"  (1.676 m)   Wt 86 kg   SpO2 96%   BMI 30.60 kg/m   3. Anemia of chronic kidney disease/ kidney injury/chronic disease/acute blood loss:   Lab Results  Component Value Date   HGB 8.9 (L) 11/03/2021  Hgb below target Continue low-dose EPO with dialysis treatments.  4. Secondary Hyperparathyroidism:     Lab Results  Component Value Date   PTH 70 (H) 05/12/2020   CALCIUM 8.8 (L) 11/03/2021   PHOS 1.8 (L) 11/03/2021   We will continue to monitor bone minerals during this admission. Will hold calcium acetate due to decreased phosphorus  5.  Severe constipation Bowel regimen of Dulcolax, lactulose, Nulytely Large BM on 6/18 Patient encouraged to increase mobility and manage pain with non-narcotics to decrease reoccurrence.     LOS: 12 Olubunmi Rothenberger s [facility] 6/24/20238:28 AM

## 2021-11-05 DIAGNOSIS — A419 Sepsis, unspecified organism: Secondary | ICD-10-CM | POA: Diagnosis not present

## 2021-11-05 LAB — GLUCOSE, CAPILLARY
Glucose-Capillary: 85 mg/dL (ref 70–99)
Glucose-Capillary: 122 mg/dL — ABNORMAL HIGH (ref 70–99)
Glucose-Capillary: 90 mg/dL (ref 70–99)
Glucose-Capillary: 125 mg/dL — ABNORMAL HIGH (ref 70–99)

## 2021-11-05 LAB — HEPATITIS B SURFACE ANTIBODY, QUANTITATIVE: Hep B S AB Quant (Post): 3.5 m[IU]/mL — ABNORMAL LOW (ref >9.9)

## 2021-11-05 NOTE — Progress Notes (Signed)
[facility]  Dialysis Note   Subjective:   Patient was seen today on first floor Patient offers no new specific physical complaint Patient resting comfortably in the bed  Objective:  Vital signs in last 24 hours:  Temp:  [98.2 F (36.8 C)-98.8 F (37.1 C)] 98.4 F (36.9 C) (06/25 0931) Pulse Rate:  [71-76] 76 (06/25 0931) Resp:  [14-18] 18 (06/25 0931) BP: (88-153)/(22-67) 115/56 (06/25 0931) SpO2:  [85 %-99 %] 85 % (06/25 0931)  Weight change:  Filed Weights   11/03/21 0500 11/04/21 0500  Weight: 86.2 kg 86 kg    Intake/Output: I/O last 3 completed shifts: In: 376 [P.O.:370; I.V.:6] Out: 1000 [Other:1000]   Intake/Output this shift:  No intake/output data recorded.  Physical Exam: General: NAD  Head: Normocephalic, atraumatic. Moist oral mucosal membranes  Eyes: Anicteric  Lungs:  Clear to auscultation, normal effort  Heart: Regular rate and rhythm  Abdomen:  Soft, nontender  Extremities: No peripheral edema.  Neurologic: Nonfocal, moving all four extremities  Skin: No lesions  Access: Right PermCath    Basic Metabolic Panel: Recent Labs  Lab 11/03/21 1153  NA 136  K 4.1  CL 99  CO2 31  GLUCOSE 124*  BUN 20  CREATININE 3.37*  CALCIUM 8.8*  PHOS 1.8*    Liver Function Tests: Recent Labs  Lab 11/03/21 1153  ALBUMIN 2.4*   No results for input(s): "LIPASE", "AMYLASE" in the last 168 hours. No results for input(s): "AMMONIA" in the last 168 hours.  CBC: Recent Labs  Lab 11/01/21 0508 11/03/21 1153  WBC 9.6 9.8  HGB 8.7* 8.9*  HCT 28.1* 29.4*  MCV 95.6 96.1  PLT 304 292    Cardiac Enzymes: No results for input(s): "CKTOTAL", "CKMB", "CKMBINDEX", "TROPONINI" in the last 168 hours.  BNP: Invalid input(s): "POCBNP"  CBG: Recent Labs  Lab 11/04/21 1429 11/04/21 1711 11/04/21 1953 11/05/21 0805 11/05/21 1130  GLUCAP 115* 117* 108* 85 122*    Microbiology: Results for orders placed or performed during the  hospital encounter of 09/30/21  Resp Panel by RT-PCR (Flu A&B, Covid) Nasopharyngeal Swab     Status: None   Collection Time: 09/30/21  9:34 AM   Specimen: Nasopharyngeal Swab; Nasopharyngeal(NP) swabs in vial transport medium  Result Value Ref Range Status   SARS Coronavirus 2 by RT PCR NEGATIVE NEGATIVE Final    Comment: (NOTE) SARS-CoV-2 target nucleic acids are NOT DETECTED.  The SARS-CoV-2 RNA is generally detectable in upper respiratory specimens during the acute phase of infection. The lowest concentration of SARS-CoV-2 viral copies this assay can detect is 138 copies/mL. A negative result does not preclude SARS-Cov-2 infection and should not be used as the sole basis for treatment or other patient management decisions. A negative result may occur with  improper specimen collection/handling, submission of specimen other than nasopharyngeal swab, presence of viral mutation(s) within the areas targeted by this assay, and inadequate number of viral copies(<138 copies/mL). A negative result must be combined with clinical observations, patient history, and epidemiological information. The expected result is Negative.  Fact Sheet for Patients:  BloggerCourse.com  Fact Sheet for Healthcare Providers:  SeriousBroker.it  This test is no t yet approved or cleared by the [place] FDA and  has been authorized for detection and/or diagnosis of SARS-CoV-2 by FDA under an Emergency Use Authorization (EUA). This EUA will remain  in effect (meaning this test can be used) for the duration of the COVID-19 declaration under Section 564(b)(1) of the Act,  21 U.S.C.section 360bbb-3(b)(1), unless the authorization is terminated  or revoked sooner.       Influenza A by PCR NEGATIVE NEGATIVE Final   Influenza B by PCR NEGATIVE NEGATIVE Final    Comment: (NOTE) The Xpert Xpress SARS-CoV-2/FLU/RSV plus assay is intended as an aid in the  diagnosis of influenza from Nasopharyngeal swab specimens and should not be used as a sole basis for treatment. Nasal washings and aspirates are unacceptable for Xpert Xpress SARS-CoV-2/FLU/RSV testing.  Fact Sheet for Patients: BloggerCourse.com  Fact Sheet for Healthcare Providers: SeriousBroker.it  This test is not yet approved or cleared by the [place] FDA and has been authorized for detection and/or diagnosis of SARS-CoV-2 by FDA under an Emergency Use Authorization (EUA). This EUA will remain in effect (meaning this test can be used) for the duration of the COVID-19 declaration under Section 564(b)(1) of the Act, 21 U.S.C. section 360bbb-3(b)(1), unless the authorization is terminated or revoked.  Performed at [facility], [address] [place], [place] [address]   SARS Coronavirus 2 by RT PCR (hospital order, performed in [facility] hospital lab) *cepheid single result test* Anterior Nasal Swab     Status: None   Collection Time: 10/23/21  4:06 PM   Specimen: Anterior Nasal Swab  Result Value Ref Range Status   SARS Coronavirus 2 by RT PCR NEGATIVE NEGATIVE Final    Comment: (NOTE) SARS-CoV-2 target nucleic acids are NOT DETECTED.  The SARS-CoV-2 RNA is generally detectable in upper and lower respiratory specimens during the acute phase of infection. The lowest concentration of SARS-CoV-2 viral copies this assay can detect is 250 copies / mL. A negative result does not preclude SARS-CoV-2 infection and should not be used as the sole basis for treatment or other patient management decisions.  A negative result may occur with improper specimen collection / handling, submission of specimen other than nasopharyngeal swab, presence of viral mutation(s) within the areas targeted by this assay, and inadequate number of viral copies (<250 copies / mL). A negative result must be combined with  clinical observations, patient history, and epidemiological information.  Fact Sheet for Patients:   RoadLapTop.co.za  Fact Sheet for Healthcare Providers: http://kim-miller.com/  This test is not yet approved or  cleared by the [place] FDA and has been authorized for detection and/or diagnosis of SARS-CoV-2 by FDA under an Emergency Use Authorization (EUA).  This EUA will remain in effect (meaning this test can be used) for the duration of the COVID-19 declaration under Section 564(b)(1) of the Act, 21 U.S.C. section 360bbb-3(b)(1), unless the authorization is terminated or revoked sooner.  Performed at [facility], [address] [place], [place] [address]   Blood culture (routine x 2)     Status: None   Collection Time: 10/23/21  4:06 PM   Specimen: BLOOD  Result Value Ref Range Status   Specimen Description BLOOD BLOOD RIGHT HAND  Final   Special Requests   Final    BOTTLES DRAWN AEROBIC AND ANAEROBIC Blood Culture adequate volume   Culture   Final    NO GROWTH 5 DAYS Performed at [facility], [address] [place], [place] [address]    Report Status 10/28/2021 FINAL  Final  Culture, blood (Routine X 2) w Reflex to ID Panel     Status: None   Collection Time: 10/24/21 12:06 AM   Specimen: BLOOD  Result Value Ref Range Status   Specimen Description BLOOD LEFT HAND  Final  Special Requests   Final    BOTTLES DRAWN AEROBIC AND ANAEROBIC Blood Culture adequate volume   Culture   Final    NO GROWTH 5 DAYS Performed at [facility], [address] [place], [place] [address]    Report Status 10/29/2021 FINAL  Final  MRSA Next Gen by PCR, Nasal     Status: None   Collection Time: 10/24/21  2:24 PM   Specimen: Nasal Mucosa; Nasal Swab  Result Value Ref Range Status   MRSA by PCR Next Gen NOT DETECTED NOT DETECTED Final    Comment: (NOTE) The GeneXpert MRSA Assay (FDA approved for NASAL  specimens only), is one component of a comprehensive MRSA colonization surveillance program. It is not intended to diagnose MRSA infection nor to guide or monitor treatment for MRSA infections. Test performance is not FDA approved in patients less than 34 years old. Performed at [facility], [address] [place], [place] [address]   Respiratory (~20 pathogens) panel by PCR     Status: None   Collection Time: 10/24/21  2:24 PM   Specimen: Nasopharyngeal Swab; Respiratory  Result Value Ref Range Status   Adenovirus NOT DETECTED NOT DETECTED Final   Coronavirus 229E NOT DETECTED NOT DETECTED Final    Comment: (NOTE) The Coronavirus on the Respiratory Panel, DOES NOT test for the novel  Coronavirus (2019 nCoV)    Coronavirus HKU1 NOT DETECTED NOT DETECTED Final   Coronavirus NL63 NOT DETECTED NOT DETECTED Final   Coronavirus OC43 NOT DETECTED NOT DETECTED Final   Metapneumovirus NOT DETECTED NOT DETECTED Final   Rhinovirus / Enterovirus NOT DETECTED NOT DETECTED Final   Influenza A NOT DETECTED NOT DETECTED Final   Influenza B NOT DETECTED NOT DETECTED Final   Parainfluenza Virus 1 NOT DETECTED NOT DETECTED Final   Parainfluenza Virus 2 NOT DETECTED NOT DETECTED Final   Parainfluenza Virus 3 NOT DETECTED NOT DETECTED Final   Parainfluenza Virus 4 NOT DETECTED NOT DETECTED Final   Respiratory Syncytial Virus NOT DETECTED NOT DETECTED Final   Bordetella pertussis NOT DETECTED NOT DETECTED Final   Bordetella Parapertussis NOT DETECTED NOT DETECTED Final   Chlamydophila pneumoniae NOT DETECTED NOT DETECTED Final   Mycoplasma pneumoniae NOT DETECTED NOT DETECTED Final    Comment: Performed at [facility], [address], [place], [place] [address]    Coagulation Studies: No results for input(s): "LABPROT", "INR" in the last 72 hours.  Urinalysis: No results for input(s): "COLORURINE", "LABSPEC", "PHURINE", "GLUCOSEU", "HGBUR", "BILIRUBINUR", "KETONESUR",  "PROTEINUR", "UROBILINOGEN", "NITRITE", "LEUKOCYTESUR" in the last 72 hours.  Invalid input(s): "APPERANCEUR"    Imaging: No results found.   Medications:      carvedilol  12.5 mg Oral QPM   Chlorhexidine Gluconate Cloth  6 each Topical Q0600   epoetin (EPOGEN/PROCRIT) injection  4,000 Units Intravenous Q T,Th,Sa-HD   feeding supplement (NEPRO CARB STEADY)  237 mL Oral TID BM   furosemide  40 mg Oral Daily   heparin  5,000 Units Subcutaneous Q12H   insulin aspart  0-15 Units Subcutaneous TID WC   lactulose  20 g Oral BID   lubiprostone  24 mcg Oral BID WC   multivitamin  1 tablet Oral QHS   pantoprazole  40 mg Oral BID   senna-docusate  2 tablet Oral BID   sodium chloride flush  3 mL Intravenous Q12H   acetaminophen **OR** acetaminophen, guaiFENesin, ondansetron (ZOFRAN) IV, oxyCODONE-acetaminophen, tiZANidine, topiramate  Assessment/ Plan:  [name] is a  66 y.o.  female with past medical history of ESRD with hemodialysis TTS, diabetes mellitus, hypertension to the hospital seeking long term placement. She was recently admitted and received antibiotics for a dog bite.  She was discharged with a prescription for home antibiotics.  She states her caregivers were unable to retrieve antibiotics.  She states that there was an argument and that she is here seeking long-term placement.  Principal Problem:   Sepsis (HCC) Active Problems:   Hypertension   Type 2 diabetes mellitus (HCC)   ESRD (end stage renal disease) (HCC)   Financial difficulty   Anemia in chronic kidney disease   Cellulitis   Bent bone fracture of ulna   Hospital acquired PNA   Fecal impaction (HCC)   Aortic stenosis   [facility]/MWF/right PermCath  End Stage Renal Disease on hemodialysis:  No need for renal placement therapy today, patient was last dialyzed yesterday We will most likely plan for next treatment for Monday Due to discharge plan including rehab, we will begin to dialyze  patient on a MWF schedule      No results found for: "POTASSIUM"  Intake/Output Summary (Last 24 hours) at 11/05/2021 1322 Last data filed at 11/04/2021 2214 Gross per 24 hour  Intake 253 ml  Output --  Net 253 ml    2. Hypertension with chronic kidney disease:   Blood pressure is at goal   BP (!) 115/56   Pulse 76   Temp 98.4 F (36.9 C) (Oral)   Resp 18   Ht 5\' 6"  (1.676 m)   Wt 86 kg   SpO2 (!) 85% Comment: refuses O2 at this time, eating breakfast, no c/o SOB  BMI 30.60 kg/m   3. Anemia of chronic kidney disease/ kidney injury/chronic disease/acute blood loss:   Lab Results  Component Value Date   HGB 8.9 (L) 11/03/2021  Hgb is just below target Continue low-dose EPO with dialysis treatments.  4. Secondary Hyperparathyroidism:     Lab Results  Component Value Date   PTH 70 (H) 05/12/2020   CALCIUM 8.8 (L) 11/03/2021   PHOS 1.8 (L) 11/03/2021   We will continue to monitor bone minerals during this admission. We will recheck phosphorus  5.  Severe constipation Bowel regimen of Dulcolax, lactulose, Nulytely Now better    LOS: [address] [facility] 6/25/20231:22 PM

## 2021-11-06 DIAGNOSIS — A419 Sepsis, unspecified organism: Secondary | ICD-10-CM | POA: Diagnosis not present

## 2021-11-06 LAB — GLUCOSE, CAPILLARY
Glucose-Capillary: 90 mg/dL (ref 70–99)
Glucose-Capillary: 105 mg/dL — ABNORMAL HIGH (ref 70–99)
Glucose-Capillary: 111 mg/dL — ABNORMAL HIGH (ref 70–99)

## 2021-11-06 MED ORDER — EPOETIN ALFA 4000 UNIT/ML IJ SOLN
INTRAMUSCULAR | Status: AC
  Administered 2021-11-06: 4000 [IU]
  Filled 2021-11-06: qty 1

## 2021-11-06 MED ORDER — HEPARIN SODIUM (PORCINE) 1000 UNIT/ML IJ SOLN
INTRAMUSCULAR | Status: AC
  Filled 2021-11-06: qty 10

## 2021-11-06 MED ORDER — EPOETIN ALFA 10000 UNIT/ML IJ SOLN
4000.0000 [IU] | INTRAMUSCULAR | Status: DC
  Administered 2021-11-08 – 2021-11-17 (×5): 4000 [IU] via INTRAVENOUS
  Filled 2021-11-06 (×6): qty 1

## 2021-11-06 NOTE — Progress Notes (Signed)
Upon entry to patient's room this AM, patient noted to have covers pulled over her head. When asked if she was "hiding this morning," patient replied "I wish I could sometimes." Patient stated she was feeling "better" but not "good." Patient visibly tearful when asked about the situation surrounding her dog bite. Patient stated, "the two dogs got in a fight.. it was my husband's dog and my roommate's dog. Both my roommate and I got bit. She got bit worse and saw someone about it, but I didn't." Patient continues to state she just "feels awful" today. When asked to clarify if it was physical, mental or otherwise, patient stated "I mean both, but I'm not suicidal or anything like that. I don't even think I'm depressed." After talking about bit more, patient then states "I just want to cry sometimes, but I don't cry."  When probed about her living situation, patient acknowledged she has no where to live at this point. She states she "can't go back there." Patient laments she is "just waiting for (the hospital) to find her a place to go."

## 2021-11-06 NOTE — Progress Notes (Signed)
[facility]  Dialysis Note   Subjective:   Patient seen laying in bed, blanket over head States she is not felling well today Tolerating meals    Objective:  Vital signs in last 24 hours:  Temp:  [98.1 F (36.7 C)-98.8 F (37.1 C)] 98.2 F (36.8 C) (06/26 1046) Pulse Rate:  [72-79] 79 (06/26 1046) Resp:  [16-18] 16 (06/26 1046) BP: (137-157)/(56-69) 137/56 (06/26 1046) SpO2:  [97 %-99 %] 98 % (06/26 1046) Weight:  [84.4 kg] 84.4 kg (06/26 0500)  Weight change:  Filed Weights   11/04/21 0500 11/06/21 0500  Weight: 86 kg 84.4 kg    Intake/Output: I/O last 3 completed shifts: In: 490 [P.O.:487; I.V.:3] Out: -    Intake/Output this shift:  No intake/output data recorded.  Physical Exam: General: NAD  Head: Normocephalic, atraumatic. Moist oral mucosal membranes  Eyes: Anicteric  Lungs:  Clear to auscultation, normal effort  Heart: Regular rate and rhythm  Abdomen:  Soft, nontender  Extremities: No peripheral edema.  Neurologic: Nonfocal, moving all four extremities  Skin: No lesions  Access: Right PermCath    Basic Metabolic Panel: Recent Labs  Lab 11/03/21 1153  NA 136  K 4.1  CL 99  CO2 31  GLUCOSE 124*  BUN 20  CREATININE 3.37*  CALCIUM 8.8*  PHOS 1.8*     Liver Function Tests: Recent Labs  Lab 11/03/21 1153  ALBUMIN 2.4*    No results for input(s): "LIPASE", "AMYLASE" in the last 168 hours. No results for input(s): "AMMONIA" in the last 168 hours.  CBC: Recent Labs  Lab 11/01/21 0508 11/03/21 1153  WBC 9.6 9.8  HGB 8.7* 8.9*  HCT 28.1* 29.4*  MCV 95.6 96.1  PLT 304 292     Cardiac Enzymes: No results for input(s): "CKTOTAL", "CKMB", "CKMBINDEX", "TROPONINI" in the last 168 hours.  BNP: Invalid input(s): "POCBNP"  CBG: Recent Labs  Lab 11/05/21 0805 11/05/21 1130 11/05/21 1614 11/05/21 2051 11/06/21 0743  GLUCAP 85 122* 90 125* 90     Microbiology: Results for orders placed or performed during the  hospital encounter of 09/30/21  Resp Panel by RT-PCR (Flu A&B, Covid) Nasopharyngeal Swab     Status: None   Collection Time: 09/30/21  9:34 AM   Specimen: Nasopharyngeal Swab; Nasopharyngeal(NP) swabs in vial transport medium  Result Value Ref Range Status   SARS Coronavirus 2 by RT PCR NEGATIVE NEGATIVE Final    Comment: (NOTE) SARS-CoV-2 target nucleic acids are NOT DETECTED.  The SARS-CoV-2 RNA is generally detectable in upper respiratory specimens during the acute phase of infection. The lowest concentration of SARS-CoV-2 viral copies this assay can detect is 138 copies/mL. A negative result does not preclude SARS-Cov-2 infection and should not be used as the sole basis for treatment or other patient management decisions. A negative result may occur with  improper specimen collection/handling, submission of specimen other than nasopharyngeal swab, presence of viral mutation(s) within the areas targeted by this assay, and inadequate number of viral copies(<138 copies/mL). A negative result must be combined with clinical observations, patient history, and epidemiological information. The expected result is Negative.  Fact Sheet for Patients:  BloggerCourse.com  Fact Sheet for Healthcare Providers:  SeriousBroker.it  This test is no t yet approved or cleared by the [place] FDA and  has been authorized for detection and/or diagnosis of SARS-CoV-2 by FDA under an Emergency Use Authorization (EUA). This EUA will remain  in effect (meaning this test can be used) for the  duration of the COVID-19 declaration under Section 564(b)(1) of the Act, 21 U.S.C.section 360bbb-3(b)(1), unless the authorization is terminated  or revoked sooner.       Influenza A by PCR NEGATIVE NEGATIVE Final   Influenza B by PCR NEGATIVE NEGATIVE Final    Comment: (NOTE) The Xpert Xpress SARS-CoV-2/FLU/RSV plus assay is intended as an aid in the  diagnosis of influenza from Nasopharyngeal swab specimens and should not be used as a sole basis for treatment. Nasal washings and aspirates are unacceptable for Xpert Xpress SARS-CoV-2/FLU/RSV testing.  Fact Sheet for Patients: BloggerCourse.com  Fact Sheet for Healthcare Providers: SeriousBroker.it  This test is not yet approved or cleared by the [place] FDA and has been authorized for detection and/or diagnosis of SARS-CoV-2 by FDA under an Emergency Use Authorization (EUA). This EUA will remain in effect (meaning this test can be used) for the duration of the COVID-19 declaration under Section 564(b)(1) of the Act, 21 U.S.C. section 360bbb-3(b)(1), unless the authorization is terminated or revoked.  Performed at [facility], [address] [place], [place] [address]   SARS Coronavirus 2 by RT PCR (hospital order, performed in [facility] hospital lab) *cepheid single result test* Anterior Nasal Swab     Status: None   Collection Time: 10/23/21  4:06 PM   Specimen: Anterior Nasal Swab  Result Value Ref Range Status   SARS Coronavirus 2 by RT PCR NEGATIVE NEGATIVE Final    Comment: (NOTE) SARS-CoV-2 target nucleic acids are NOT DETECTED.  The SARS-CoV-2 RNA is generally detectable in upper and lower respiratory specimens during the acute phase of infection. The lowest concentration of SARS-CoV-2 viral copies this assay can detect is 250 copies / mL. A negative result does not preclude SARS-CoV-2 infection and should not be used as the sole basis for treatment or other patient management decisions.  A negative result may occur with improper specimen collection / handling, submission of specimen other than nasopharyngeal swab, presence of viral mutation(s) within the areas targeted by this assay, and inadequate number of viral copies (<250 copies / mL). A negative result must be combined with  clinical observations, patient history, and epidemiological information.  Fact Sheet for Patients:   RoadLapTop.co.za  Fact Sheet for Healthcare Providers: http://kim-miller.com/  This test is not yet approved or  cleared by the [place] FDA and has been authorized for detection and/or diagnosis of SARS-CoV-2 by FDA under an Emergency Use Authorization (EUA).  This EUA will remain in effect (meaning this test can be used) for the duration of the COVID-19 declaration under Section 564(b)(1) of the Act, 21 U.S.C. section 360bbb-3(b)(1), unless the authorization is terminated or revoked sooner.  Performed at [facility], [address] [place], [place] [address]   Blood culture (routine x 2)     Status: None   Collection Time: 10/23/21  4:06 PM   Specimen: BLOOD  Result Value Ref Range Status   Specimen Description BLOOD BLOOD RIGHT HAND  Final   Special Requests   Final    BOTTLES DRAWN AEROBIC AND ANAEROBIC Blood Culture adequate volume   Culture   Final    NO GROWTH 5 DAYS Performed at [facility], [address] [place], [place] [address]    Report Status 10/28/2021 FINAL  Final  Culture, blood (Routine X 2) w Reflex to ID Panel     Status: None   Collection Time: 10/24/21 12:06 AM   Specimen: BLOOD  Result Value Ref Range Status  Specimen Description BLOOD LEFT HAND  Final   Special Requests   Final    BOTTLES DRAWN AEROBIC AND ANAEROBIC Blood Culture adequate volume   Culture   Final    NO GROWTH 5 DAYS Performed at [facility], [address] [place], [place] [address]    Report Status 10/29/2021 FINAL  Final  MRSA Next Gen by PCR, Nasal     Status: None   Collection Time: 10/24/21  2:24 PM   Specimen: Nasal Mucosa; Nasal Swab  Result Value Ref Range Status   MRSA by PCR Next Gen NOT DETECTED NOT DETECTED Final    Comment: (NOTE) The GeneXpert MRSA Assay (FDA approved for NASAL  specimens only), is one component of a comprehensive MRSA colonization surveillance program. It is not intended to diagnose MRSA infection nor to guide or monitor treatment for MRSA infections. Test performance is not FDA approved in patients less than 78 years old. Performed at [facility], [address] [place], [place] [address]   Respiratory (~20 pathogens) panel by PCR     Status: None   Collection Time: 10/24/21  2:24 PM   Specimen: Nasopharyngeal Swab; Respiratory  Result Value Ref Range Status   Adenovirus NOT DETECTED NOT DETECTED Final   Coronavirus 229E NOT DETECTED NOT DETECTED Final    Comment: (NOTE) The Coronavirus on the Respiratory Panel, DOES NOT test for the novel  Coronavirus (2019 nCoV)    Coronavirus HKU1 NOT DETECTED NOT DETECTED Final   Coronavirus NL63 NOT DETECTED NOT DETECTED Final   Coronavirus OC43 NOT DETECTED NOT DETECTED Final   Metapneumovirus NOT DETECTED NOT DETECTED Final   Rhinovirus / Enterovirus NOT DETECTED NOT DETECTED Final   Influenza A NOT DETECTED NOT DETECTED Final   Influenza B NOT DETECTED NOT DETECTED Final   Parainfluenza Virus 1 NOT DETECTED NOT DETECTED Final   Parainfluenza Virus 2 NOT DETECTED NOT DETECTED Final   Parainfluenza Virus 3 NOT DETECTED NOT DETECTED Final   Parainfluenza Virus 4 NOT DETECTED NOT DETECTED Final   Respiratory Syncytial Virus NOT DETECTED NOT DETECTED Final   Bordetella pertussis NOT DETECTED NOT DETECTED Final   Bordetella Parapertussis NOT DETECTED NOT DETECTED Final   Chlamydophila pneumoniae NOT DETECTED NOT DETECTED Final   Mycoplasma pneumoniae NOT DETECTED NOT DETECTED Final    Comment: Performed at [facility], [address], [place], [place] [address]    Coagulation Studies: No results for input(s): "LABPROT", "INR" in the last 72 hours.  Urinalysis: No results for input(s): "COLORURINE", "LABSPEC", "PHURINE", "GLUCOSEU", "HGBUR", "BILIRUBINUR", "KETONESUR",  "PROTEINUR", "UROBILINOGEN", "NITRITE", "LEUKOCYTESUR" in the last 72 hours.  Invalid input(s): "APPERANCEUR"    Imaging: No results found.   Medications:      carvedilol  12.5 mg Oral QPM   Chlorhexidine Gluconate Cloth  6 each Topical Q0600   epoetin (EPOGEN/PROCRIT) injection  4,000 Units Intravenous Q T,Th,Sa-HD   feeding supplement (NEPRO CARB STEADY)  237 mL Oral TID BM   furosemide  40 mg Oral Daily   heparin  5,000 Units Subcutaneous Q12H   insulin aspart  0-15 Units Subcutaneous TID WC   lactulose  20 g Oral BID   lubiprostone  24 mcg Oral BID WC   multivitamin  1 tablet Oral QHS   pantoprazole  40 mg Oral BID   senna-docusate  2 tablet Oral BID   sodium chloride flush  3 mL Intravenous Q12H   acetaminophen **OR** acetaminophen, guaiFENesin, ondansetron (ZOFRAN) IV, oxyCODONE-acetaminophen, tiZANidine, topiramate  Assessment/ Plan:  [name] is a 66 y.o.  female with past medical history of ESRD with hemodialysis TTS, diabetes mellitus, hypertension to the hospital seeking long term placement. She was recently admitted and received antibiotics for a dog bite.  She was discharged with a prescription for home antibiotics.  She states her caregivers were unable to retrieve antibiotics.  She states that there was an argument and that she is here seeking long-term placement.  Principal Problem:   Sepsis (HCC) Active Problems:   Hypertension   Type 2 diabetes mellitus (HCC)   ESRD (end stage renal disease) (HCC)   Financial difficulty   Anemia in chronic kidney disease   Cellulitis   Bent bone fracture of ulna   Hospital acquired PNA   Fecal impaction (HCC)   Aortic stenosis   [facility]/MWF/right PermCath  End Stage Renal Disease on hemodialysis:  Scheduled for dialysis today, UF goal 1L as tolerated. Next treatment scheduled for Wednesday Patient initially refusing to receive treatment in recliner due to not feeling well. This NP was in  route to speak with patient and encourage treatment in chair. Patient will be dialyzed in chair for all treatments moving forward to maintain endurance for outpatient treatments.   No results found for: "POTASSIUM"  Intake/Output Summary (Last 24 hours) at 11/06/2021 1050 Last data filed at 11/05/2021 1515 Gross per 24 hour  Intake 237 ml  Output --  Net 237 ml     2. Hypertension with chronic kidney disease:   Remains on Carvedilol.    BP (!) 137/56 (BP Location: Right Arm) Comment: nurse notified  Pulse 79   Temp 98.2 F (36.8 C) (Oral)   Resp 16   Ht 5\' 6"  (1.676 m)   Wt 84.4 kg   SpO2 98%   BMI 30.03 kg/m   3. Anemia of chronic kidney disease/ kidney injury/chronic disease/acute blood loss:   Lab Results  Component Value Date   HGB 8.9 (L) 11/03/2021  Hgb below target Continue low-dose EPO with dialysis treatments.  4. Secondary Hyperparathyroidism:     Lab Results  Component Value Date   PTH 70 (H) 05/12/2020   CALCIUM 8.8 (L) 11/03/2021   PHOS 1.8 (L) 11/03/2021   We will continue to monitor bone minerals during this admission. Will hold calcium acetate due to decreased phosphorus  5.  Severe constipation Managed with stool softeners. Encouraged patient to utilize non-narcotic medications to minimize reoccurrence.    LOS: 14 [facility] 6/26/202310:50 AM

## 2021-11-07 DIAGNOSIS — R4182 Altered mental status, unspecified: Secondary | ICD-10-CM

## 2021-11-07 DIAGNOSIS — N186 End stage renal disease: Secondary | ICD-10-CM | POA: Diagnosis not present

## 2021-11-07 DIAGNOSIS — A419 Sepsis, unspecified organism: Secondary | ICD-10-CM | POA: Diagnosis not present

## 2021-11-07 DIAGNOSIS — J189 Pneumonia, unspecified organism: Secondary | ICD-10-CM | POA: Diagnosis not present

## 2021-11-07 DIAGNOSIS — F4323 Adjustment disorder with mixed anxiety and depressed mood: Secondary | ICD-10-CM

## 2021-11-07 LAB — CBC
HCT: 30.1 % — ABNORMAL LOW (ref 36.0–46.0)
Hemoglobin: 9.2 g/dL — ABNORMAL LOW (ref 12.0–15.0)
MCH: 29.5 pg (ref 26.0–34.0)
MCHC: 30.6 g/dL (ref 30.0–36.0)
MCV: 96.5 fL (ref 80.0–100.0)
Platelets: 296 10*3/uL (ref 150–400)
RBC: 3.12 MIL/uL — ABNORMAL LOW (ref 3.87–5.11)
RDW: 15.4 % (ref 11.5–15.5)
WBC: 6.9 10*3/uL (ref 4.0–10.5)
nRBC: 0 % (ref 0.0–0.2)

## 2021-11-07 LAB — RENAL FUNCTION PANEL
Albumin: 2.5 g/dL — ABNORMAL LOW (ref 3.5–5.0)
Anion gap: 7 (ref 5–15)
BUN: 18 mg/dL (ref 8–23)
CO2: 30 mmol/L (ref 22–32)
Calcium: 8.7 mg/dL — ABNORMAL LOW (ref 8.9–10.3)
Chloride: 101 mmol/L (ref 98–111)
Creatinine, Ser: 2.84 mg/dL — ABNORMAL HIGH (ref 0.44–1.00)
GFR, Estimated: 18 mL/min — ABNORMAL LOW (ref >60)
Glucose, Bld: 88 mg/dL (ref 70–99)
Phosphorus: 2.2 mg/dL — ABNORMAL LOW (ref 2.5–4.6)
Potassium: 4.1 mmol/L (ref 3.5–5.1)
Sodium: 138 mmol/L (ref 135–145)

## 2021-11-07 LAB — GLUCOSE, CAPILLARY
Glucose-Capillary: 86 mg/dL (ref 70–99)
Glucose-Capillary: 114 mg/dL — ABNORMAL HIGH (ref 70–99)
Glucose-Capillary: 99 mg/dL (ref 70–99)

## 2021-11-07 MED ORDER — ALBUTEROL SULFATE (2.5 MG/3ML) 0.083% IN NEBU: 3.0000 mL | INHALATION_SOLUTION | Freq: Four times a day (QID) | RESPIRATORY_TRACT | Status: DC | PRN

## 2021-11-07 NOTE — TOC Progression Note (Signed)
Transition of Care [facility]) - Progression Note    Patient Details  Name: Katie [name] MRN: [id number] Date of Birth: [birth year]  [facility] CM/SW Contact  Caryn Section, RN Phone Number: 11/07/2021, 4:24 PM  Clinical Narrative:   Patient has medicaid prepaid, which can serve as a potential barrier for coordination with placement and dialysis services.  Group home was being sought after but recommendaitons then changed to SNF.  SNF bed search was sent, but dialysis was not able to accommodate SNF bed offers in [place].  Bed search continues    Expected Discharge Plan: Skilled Nursing Facility Barriers to Discharge: SNF Pending bed offer  Expected Discharge Plan and Services Expected Discharge Plan: Skilled Nursing Facility   Discharge Planning Services: CM Consult Post Acute Care Choice: Skilled Nursing Facility Living arrangements for the past 2 months: Single Family Home                 DME Arranged: N/A DME Agency: NA       HH Arranged: NA HH Agency: NA         Social Determinants of Health (SDOH) Interventions    Readmission Risk Interventions    09/27/2021    1:44 PM 09/26/2021   12:46 PM 03/08/2021    2:49 PM  Readmission Risk Prevention Plan  Transportation Screening  Complete Complete  Palliative Care Screening Not Applicable    Medication Review (RN Care Manager)  Complete Complete  HRI or Home Care Consult   Complete  SW Recovery Care/Counseling Consult  Complete   Palliative Care Screening  Not Applicable   Skilled Nursing Facility  Not Applicable Complete

## 2021-11-07 NOTE — Progress Notes (Signed)
  Progress Note   Patient: Katie [name] ZOX:[id number] DOB: 1955/12/11 DOA: 09/30/2021     15 DOS: the patient was seen and examined on 11/07/2021   Brief hospital course: Katie [name] is a 66 y.o. female with medical history significant of ESRD, DM II, homelessness. Had been admitted 05/13-05/17 for sepsis d/t cellulits from animal bite to arm, discharged on po abx and was unable to fill the Rx, was kicked out of where she was living, no way to get HD.  Patient has been in the ED since arriving the hospital.  She was admitted to the hospital again on 10/23/2021 as patient developed cough short of breath and fever.  Chest x-ray showed pneumonia.  Patient was diagnosed with sepsis and pneumonia, was started on cefepime and vancomycin. Had MRI performed on 6/14 did not show any osteomyelitis. Patient is currently homeless, has a severe weakness.  No discharge option at this time. 6/15.  Patient has no nausea, KUB showed fecal impaction with severe dilated colon, patient was started on senna as well as scheduled lactulose.  CT chest also showed dense right lower lobe consolidation consistent with aspiration pneumonia.  Shoulder x-ray does not show any osteomyelitis versus septic joint. Patient fecal impaction resolved after Nulytely on 6/17. Patient condition has stabilized, pending nursing home placement.  Assessment and Plan: Resolved Sepsis. Treated Healthcare associate pneumonia. Treated Recent dog bites with cellulitis. Condition resolved,   Improved intractable nausea and vomiting Resolved fecal impaction. Chronic constipation Condition improved, continue stool softeners.   End stage renal disease on dialysis, now MWF. Anemia of chronic kidney disease. Resolved hypokalemia. Hemodialysis as scheduled  Type 2 diabetes. Continue current regimen  Essential hypertension. Continue current treatment   Mild aortic stenosis. Follow-up with PCP as outpatient.   Possible  depression Psychiatry consult ordered, pending.        Subjective:  Patient is doing well.  Denies any short of breath or cough.  Physical Exam: Vitals:   11/06/21 2058 11/07/21 0500 11/07/21 0534 11/07/21 0818  BP: 139/64  (!) 157/72 (!) 156/64  Pulse: 80  84 80  Resp: 16  18 15   Temp: 98 F (36.7 C)  99.3 F (37.4 C) 98.2 F (36.8 C)  TempSrc:      SpO2: 94%  93% 91%  Weight:  81.6 kg    Height:       General exam: Appears calm and comfortable  Respiratory system: Crackles in the base. Respiratory effort normal. Cardiovascular system: S1 & S2 heard, RRR. No JVD,  2/6 SM at RUSB Gastrointestinal system: Abdomen is nondistended, soft and nontender. No organomegaly or masses felt. Normal bowel sounds heard. Central nervous system: Alert and oriented. No focal neurological deficits. Extremities: Symmetric 5 x 5 power. Skin: No rashes, lesions or ulcers Psychiatry: Judgement and insight appear normal. Mood & affect appropriate.   Data Reviewed:  Lab results reviewed.  Family Communication:   Disposition: Status is: Inpatient Remains inpatient appropriate because: Unsafe discharge.  Planned Discharge Destination: Skilled nursing facility    Time spent: 28 minutes  Author: Marrion Coy, MD 11/07/2021 11:18 AM  For on call review [email or web address].

## 2021-11-07 NOTE — Progress Notes (Signed)
Physical Therapy Treatment Patient Details Name: Katie [name] MRN: [id number] DOB: [birth year] Today's Date: 11/07/2021   History of Present Illness Pt is a 66 y.o. female presenting to hospital 5/20 with concerns for worsening L hand pain d/t animal bite (pt recently admitted 5/13-5/17 for same reason). Pt's friend dropped her off at dialysis and refused to do any more (pt now homeless).  PMH includes ESRD on HD TTS, DM, htn, orthostatic hypotension, and h/o mandible surgery.    PT Comments    Pt received in Semi-Fowler's position and agreeable to therapy.  Pt stated she was feeling nauseous, but willing to work with therapy since she was unable/refused to do so yesterday.  Pt performed bed-level exercises with good technique.  Pt demonstrates good strength, but limited currently with medical condition.  Pt able to transfer to standing with good technique and ambulate to the nursing station and returned to the room.  Pt had to sit at EOB before transferring to the recliner due to dizziness and becoming nauseous.  Pt left in recliner with call bell and all needs met.  Pt actively vomiting in emesis bag given to ehr by therapist.  Nursing notified.  Current discharge plans to SNF remain appropriate at this time.  Pt will continue to benefit from skilled therapy in order to address deficits listed below.  Goals adjusted and submitted.     Recommendations for follow up therapy are one component of a multi-disciplinary discharge planning process, led by the attending physician.  Recommendations may be updated based on patient status, additional functional criteria and insurance authorization.  Follow Up Recommendations  Skilled nursing-short term rehab (<3 hours/day) Can patient physically be transported by private vehicle: Yes   Assistance Recommended at Discharge Frequent or constant Supervision/Assistance  Patient can return home with the following A little help with walking and/or transfers;A  little help with bathing/dressing/bathroom;Assistance with cooking/housework;Assistance with feeding;Direct supervision/assist for medications management;Direct supervision/assist for financial management;Assist for transportation;Help with stairs or ramp for entrance   Equipment Recommendations  Other (comment) (defer to next level of care)    Recommendations for Other Services       Precautions / Restrictions Precautions Precautions: Fall Restrictions Weight Bearing Restrictions: No     Mobility  Bed Mobility Overal bed mobility: Modified Independent Bed Mobility: Supine to Sit     Supine to sit: HOB elevated, Modified independent (Device/Increase time)          Transfers Overall transfer level: Needs assistance Equipment used: Rolling walker (2 wheels) Transfers: Sit to/from Stand Sit to Stand: Supervision                Ambulation/Gait Ambulation/Gait assistance: Min guard Gait Distance (Feet): 60 Feet Assistive device: Rolling walker (2 wheels) Gait Pattern/deviations: Step-through pattern Gait velocity: decreased     General Gait Details: pt reported feeling lightheaded at end of session/gait training and nauseous.   Stairs             Wheelchair Mobility    Modified Rankin (Stroke Patients Only)       Balance Overall balance assessment: Needs assistance Sitting-balance support: No upper extremity supported, Feet supported Sitting balance-Leahy Scale: Good     Standing balance support: Bilateral upper extremity supported, During functional activity, Reliant on assistive device for balance Standing balance-Leahy Scale: Fair Standing balance comment: B UE support when in standing.  Cognition Arousal/Alertness: Awake/alert Behavior During Therapy: WFL for tasks assessed/performed Overall Cognitive Status: Within Functional Limits for tasks assessed                                           Exercises Total Joint Exercises Ankle Circles/Pumps: AROM, Strengthening, Both, 10 reps, Supine Quad Sets: AROM, Strengthening, Both, 10 reps, Supine Gluteal Sets: AROM, Strengthening, Both, 10 reps, Supine Heel Slides: AROM, Strengthening, Both, 10 reps, Supine Hip ABduction/ADduction: AROM, Strengthening, Both, 10 reps, Supine Straight Leg Raises: AROM, Strengthening, Both, 10 reps, Supine Bridges:  (attempted bridging with pt unable to complete full ROM)    General Comments        Pertinent Vitals/Pain Pain Assessment Pain Assessment: Faces Faces Pain Scale: Hurts a little bit Pain Location: generalized pain Pain Descriptors / Indicators: Grimacing, Discomfort Pain Intervention(s): Limited activity within patient's tolerance, Monitored during session, Repositioned    Home Living                          Prior Function            PT Goals (current goals can now be found in the care plan section) Acute Rehab PT Goals Patient Stated Goal: get stronger and find a place to live;  goals updated today. PT Goal Formulation: With patient Time For Goal Achievement: 11/21/21 Potential to Achieve Goals: Good Progress towards PT goals: Progressing toward goals    Frequency    Min 2X/week      PT Plan Current plan remains appropriate    Co-evaluation              AM-PAC PT "6 Clicks" Mobility   Outcome Measure  Help needed turning from your back to your side while in a flat bed without using bedrails?: None Help needed moving from lying on your back to sitting on the side of a flat bed without using bedrails?: A Little Help needed moving to and from a bed to a chair (including a wheelchair)?: A Little Help needed standing up from a chair using your arms (e.g., wheelchair or bedside chair)?: A Little Help needed to walk in hospital room?: A Little Help needed climbing 3-5 steps with a railing? : A Little 6 Click Score: 19    End of Session  Equipment Utilized During Treatment: Oxygen Activity Tolerance: Patient tolerated treatment well Patient left: in chair;with call bell/phone within reach;with chair alarm set Nurse Communication: Mobility status;Other (comment) (Pt actively vomiting in emesis bag.  Nursing notified.) PT Visit Diagnosis: Other abnormalities of gait and mobility (R26.89);Unsteadiness on feet (R26.81);Muscle weakness (generalized) (M62.81);History of falling (Z91.81);Pain     Time: 7564-3329 PT Time Calculation (min) (ACUTE ONLY): 50 min  Charges:  $Therapeutic Exercise: 8-22 mins $Therapeutic Activity: 23-37 mins                     Nolon Bussing, PT, DPT 11/07/21, 1:07 PM    Phineas Real 11/07/2021, 1:05 PM

## 2021-11-07 NOTE — Consult Note (Signed)
[facility] Consult   Reason for Consult:  "flat affect" Referring Physician:  Roosevelt Locks Patient Identification: Katie [name] MRN:  [id number] Principal Diagnosis: Sepsis [facility]) Diagnosis:  Principal Problem:   Sepsis ([place]) Active Problems:   Hypertension   Type 2 diabetes mellitus ([place])   ESRD (end stage renal disease) ([place])   Financial difficulty   Anemia in chronic kidney disease   Cellulitis   Bent bone fracture of ulna   Hospital acquired PNA   Fecal impaction ([place])   Aortic stenosis   Total Time spent with patient: 30 minutes  Subjective:  "I have never even remotely thought of killing myself."  Katie [name] is a 66 y.o. female patient admitted with See above medical problems.  HPI: Patient is on medical floor and then was seen for psychiatric consult due to patient having "flat affect.  On evaluation, patient is pleasant and cooperative.  She does seem to have somewhat of a flat affect.  However, she states that she does not feel physically well, is nauseous due to the dialysis.  She denies any real depression, stating that sometimes she feels sad just like everybody else but she has never been so sad that she would call it depressed.  Denies ever having suicidal thoughts.  Denies homicidal ideation, paranoia, auditory or visual hallucinations.  She speaks in clear, linear sentences. Patient reports that she was living with a friend for this past year.  She has known this friend for over 20 years.  Patient was injured when the friend's dogs were fighting.  Patient was bit on the hand and it got infected.  Since then the relationship has been a little precarious.  Prior to patient living with this friend she had been homeless.  Patient will not be able to return to the friend's house.  Patient states that she has a niece and his sister out of town and they are not a very close family.  Patient states that she is a little uneasy about what is going to happen to her.  She  knows that she needs to go to a facility, and she is anxious to find out where that will be.  She states that that in itself does not bother her she just would like to know what is going to happen.  Encouragement provided to patient. Patient denies any past history of mental health issues.  She does not feel that she needs to be on an antidepressant at this time, but says she would be open to it in the future.  She states that she would like to get settled where she is going to be and see how she feels after that.   Past Psychiatric History: Denies  Risk to Self:   Risk to Others:   Prior Inpatient Therapy:   Prior Outpatient Therapy:    Past Medical History:  Past Medical History:  Diagnosis Date   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[place])    Hypertension    Renal disorder    Type 2 diabetes mellitus ([place])     Past Surgical History:  Procedure Laterality Date   APPENDECTOMY     AV FISTULA PLACEMENT Left 03/16/2021   Procedure: INSERTION OF ARTERIOVENOUS (AV) GORE-TEX GRAFT ARM;  Surgeon: Algernon Huxley, MD;  Location: [facility] ORS;  Service: Vascular;  Laterality: Left;   DIALYSIS/PERMA CATHETER INSERTION N/A 05/16/2020   Procedure: DIALYSIS/PERMA CATHETER INSERTION;  Surgeon: Algernon Huxley, MD;  Location: [place] CV LAB;  Service: Cardiovascular;  Laterality: N/A;   MANDIBLE SURGERY     RENAL BIOPSY Right    RIGHT HEART CATH N/A 05/10/2020   Procedure: RIGHT HEART CATH;  Surgeon: Nelva Bush, MD;  Location: [place] CV LAB;  Service: Cardiovascular;  Laterality: N/A;   TEMPORARY DIALYSIS CATHETER N/A 05/11/2020   Procedure: TEMPORARY DIALYSIS CATHETER;  Surgeon: Algernon Huxley, MD;  Location: [place] CV LAB;  Service: Cardiovascular;  Laterality: N/A;   Family History:  Family History  Problem Relation Age of Onset   Heart attack Mother    Asthma Mother    Uterine cancer Mother    Skin cancer Father    Prostate cancer Father    CAD Father    Parkinson's  disease Father    Lung cancer Brother    Family Psychiatric  History: Unknown Social History:  Social History   Substance and Sexual Activity  Alcohol Use Not Currently     Social History   Substance and Sexual Activity  Drug Use Never    Social History   Socioeconomic History   Marital status: Single    Spouse name: Not on file   Number of children: Not on file   Years of education: Not on file   Highest education level: Not on file  Occupational History   Not on file  Tobacco Use   Smoking status: Former    Types: Cigarettes   Smokeless tobacco: Never  Vaping Use   Vaping Use: Never used  Substance and Sexual Activity   Alcohol use: Not Currently   Drug use: Never   Sexual activity: Not on file  Other Topics Concern   Not on file  Social History Narrative   Not on file   Social Determinants of Health   Financial Resource Strain: Not on file  Food Insecurity: Not on file  Transportation Needs: Not on file  Physical Activity: Not on file  Stress: Not on file  Social Connections: Not on file   Additional Social History:    Allergies:   Allergies  Allergen Reactions   Penicillins Nausea And Vomiting    Pt states "only pills" make her vomit Other reaction(s): Nausea/Vomit    Labs:  Results for orders placed or performed during the hospital encounter of 09/30/21 (from the past 48 hour(s))  Glucose, capillary     Status: None   Collection Time: 11/05/21  4:14 PM  Result Value Ref Range   Glucose-Capillary 90 70 - 99 mg/dL    Comment: Glucose reference range applies only to samples taken after fasting for at least 8 hours.  Glucose, capillary     Status: Abnormal   Collection Time: 11/05/21  8:51 PM  Result Value Ref Range   Glucose-Capillary 125 (H) 70 - 99 mg/dL    Comment: Glucose reference range applies only to samples taken after fasting for at least 8 hours.  Glucose, capillary     Status: None   Collection Time: 11/06/21  7:43 AM  Result  Value Ref Range   Glucose-Capillary 90 70 - 99 mg/dL    Comment: Glucose reference range applies only to samples taken after fasting for at least 8 hours.  Glucose, capillary     Status: Abnormal   Collection Time: 11/06/21  5:17 PM  Result Value Ref Range   Glucose-Capillary 105 (H) 70 - 99 mg/dL    Comment: Glucose reference range applies only to samples taken after fasting for at least 8 hours.  Glucose, capillary  Status: Abnormal   Collection Time: 11/06/21  9:00 PM  Result Value Ref Range   Glucose-Capillary 111 (H) 70 - 99 mg/dL    Comment: Glucose reference range applies only to samples taken after fasting for at least 8 hours.  Renal function panel     Status: Abnormal   Collection Time: 11/07/21  5:04 AM  Result Value Ref Range   Sodium 138 135 - 145 mmol/L   Potassium 4.1 3.5 - 5.1 mmol/L   Chloride 101 98 - 111 mmol/L   CO2 30 22 - 32 mmol/L   Glucose, Bld 88 70 - 99 mg/dL    Comment: Glucose reference range applies only to samples taken after fasting for at least 8 hours.   BUN 18 8 - 23 mg/dL   Creatinine, Ser 2.84 (H) 0.44 - 1.00 mg/dL   Calcium 8.7 (L) 8.9 - 10.3 mg/dL   Phosphorus 2.2 (L) 2.5 - 4.6 mg/dL   Albumin 2.5 (L) 3.5 - 5.0 g/dL   GFR, Estimated 18 (L) >60 mL/min    Comment: (NOTE) Calculated using the CKD-EPI Creatinine Equation (2021)    Anion gap 7 5 - 15    Comment: Performed at [facility] [address] [place], [place] [address]  CBC     Status: Abnormal   Collection Time: 11/07/21  5:04 AM  Result Value Ref Range   WBC 6.9 4.0 - 10.5 K/uL   RBC 3.12 (L) 3.87 - 5.11 MIL/uL   Hemoglobin 9.2 (L) 12.0 - 15.0 g/dL   HCT 30.1 (L) 36.0 - 46.0 %   MCV 96.5 80.0 - 100.0 fL   MCH 29.5 26.0 - 34.0 pg   MCHC 30.6 30.0 - 36.0 g/dL   RDW 15.4 11.5 - 15.5 %   Platelets 296 150 - 400 K/uL   nRBC 0.0 0.0 - 0.2 %    Comment: Performed at [facility], [address] [place], [place] [address]  Glucose, capillary     Status: None    Collection Time: 11/07/21  8:19 AM  Result Value Ref Range   Glucose-Capillary 86 70 - 99 mg/dL    Comment: Glucose reference range applies only to samples taken after fasting for at least 8 hours.  Glucose, capillary     Status: Abnormal   Collection Time: 11/07/21 12:17 PM  Result Value Ref Range   Glucose-Capillary 114 (H) 70 - 99 mg/dL    Comment: Glucose reference range applies only to samples taken after fasting for at least 8 hours.    Current Facility-Administered Medications  Medication Dose Route Frequency Provider Last Rate Last Admin   acetaminophen (TYLENOL) tablet 650 mg  650 mg Oral Q6H PRN Para Skeans, MD   650 mg at 10/24/21 1728   Or   acetaminophen (TYLENOL) suppository 650 mg  650 mg Rectal Q6H PRN Para Skeans, MD       albuterol (PROVENTIL) (2.5 MG/3ML) 0.083% nebulizer solution 3 mL  3 mL Inhalation Q6H PRN Sharen Hones, MD       carvedilol (COREG) tablet 12.5 mg  12.5 mg Oral QPM Florina Ou V, MD   12.5 mg at 11/06/21 2259   Chlorhexidine Gluconate Cloth 2 % PADS 6 each  6 each Topical Q0600 Para Skeans, MD   6 each at 11/07/21 [phone]   epoetin alfa (EPOGEN) injection 4,000 Units  4,000 Units Intravenous Q M,W,F-HD Colon Flattery, NP       feeding supplement (NEPRO CARB STEADY)  liquid 237 mL  237 mL Oral TID BM Emeterio Reeve, DO   237 mL at 11/07/21 0939   furosemide (LASIX) tablet 40 mg  40 mg Oral Daily Colon Flattery, NP   40 mg at 11/07/21 0939   guaiFENesin (ROBITUSSIN) 100 MG/5ML liquid 10 mL  10 mL Oral Q4H PRN Para Skeans, MD   10 mL at 11/02/21 0105   heparin injection 5,000 Units  5,000 Units Subcutaneous Q12H Para Skeans, MD   5,000 Units at 11/07/21 6144   insulin aspart (novoLOG) injection 0-15 Units  0-15 Units Subcutaneous TID WC Para Skeans, MD   2 Units at 11/05/21 1228   lactulose (CHRONULAC) 10 GM/15ML solution 20 g  20 g Oral BID Efrain Sella, MD   20 g at 11/05/21 3154   lubiprostone (AMITIZA) capsule 24 mcg  24 mcg  Oral BID WC Sharen Hones, MD   24 mcg at 11/07/21 0086   multivitamin (RENA-VIT) tablet 1 tablet  1 tablet Oral QHS Emeterio Reeve, DO   1 tablet at 11/06/21 2259   ondansetron (ZOFRAN) injection 4 mg  4 mg Intravenous Q6H PRN Murlean Iba, MD   4 mg at 11/07/21 1109   oxyCODONE-acetaminophen (PERCOCET/ROXICET) 5-325 MG per tablet 1 tablet  1 tablet Oral Q6H PRN Para Skeans, MD   1 tablet at 11/07/21 0826   pantoprazole (PROTONIX) EC tablet 40 mg  40 mg Oral BID Darnelle Bos, RPH   40 mg at 11/07/21 7619   senna-docusate (Senokot-S) tablet 2 tablet  2 tablet Oral BID Irene Pap N, DO   2 tablet at 11/07/21 5093   sodium chloride flush (NS) 0.9 % injection 3 mL  3 mL Intravenous Q12H Para Skeans, MD   3 mL at 11/07/21 0939   tiZANidine (ZANAFLEX) tablet 4 mg  4 mg Oral BID PRN Para Skeans, MD   4 mg at 10/31/21 2139   topiramate (TOPAMAX) tablet 50 mg  50 mg Oral BID PRN Para Skeans, MD   50 mg at 11/06/21 1609    Musculoskeletal: Strength & Muscle Tone: decreased Gait & Station:  Did not observe Patient leans: N/A   Psychiatric Specialty Exam:  Presentation  General Appearance: Appropriate for Environment; Casual Eye Contact:Good Speech:Clear and Coherent Speech Volume:Normal Handedness:No data recorded  Mood and Affect  Mood:Euthymic Affect:Congruent  Thought Process  Thought Processes:Coherent Descriptions of Associations:Intact  Orientation:Full (Time, Place and Person)  Thought Content:Logical  History of Schizophrenia/Schizoaffective disorder:No data recorded Duration of Psychotic Symptoms:No data recorded Hallucinations:Hallucinations: None  Ideas of Reference:None  Suicidal Thoughts:Suicidal Thoughts: No  Homicidal Thoughts:Homicidal Thoughts: No   Sensorium  Memory:Immediate Good; Recent Good Judgment:Good Insight:Good  Executive Functions  Concentration:Fair Attention Span:Fair [place]  Psychomotor Activity  Psychomotor Activity:Psychomotor Activity: Normal  Assets  Assets:Communication Skills; Desire for Improvement; Financial Resources/Insurance; Resilience  Sleep  Sleep:Sleep: Fair  Physical Exam: Physical Exam Vitals and nursing note reviewed.  HENT:     Head: Normocephalic.     Nose: No congestion or rhinorrhea.  Eyes:     General:        Right eye: No discharge.        Left eye: No discharge.  Cardiovascular:     Rate and Rhythm: Normal rate.  Pulmonary:     Effort: Pulmonary effort is normal.  Musculoskeletal:        General: Normal range of motion.     Cervical back: Normal  range of motion.  Skin:    General: Skin is dry.  Neurological:     Mental Status: She is alert and oriented to person, place, and time.  Psychiatric:        Behavior: Behavior normal.    Review of Systems  HENT: Negative.    Eyes: Negative.   Respiratory: Negative.    Psychiatric/Behavioral:  Negative for depression, hallucinations, memory loss, substance abuse and suicidal ideas. The patient is nervous/anxious. The patient does not have insomnia.    Blood pressure (!) 156/64, pulse 80, temperature 98.2 F (36.8 C), resp. rate 15, height 5\' 6"  (1.676 m), weight 81.6 kg, SpO2 90 %. Body mass index is 29.04 kg/m.  Treatment Plan Summary: Patient states she is waiting  to be placed in nursing facility. She denies past or current suicidal thoughts. States she is not depressed, maybe a little anxiety about where she is going to live.  She expresses that she knows she would need to live in a facility.  No recommendations at this time. Advised Dr. Roosevelt Locks   Disposition: No evidence of imminent risk to self or others at present.   Patient does not meet criteria for psychiatric inpatient admission. Supportive therapy provided about ongoing stressors.  Sherlon Handing, NP 11/07/2021 3:25 PM

## 2021-11-08 ENCOUNTER — Inpatient Hospital Stay: Payer: Medicaid Other

## 2021-11-08 DIAGNOSIS — N186 End stage renal disease: Secondary | ICD-10-CM | POA: Diagnosis not present

## 2021-11-08 DIAGNOSIS — I10 Essential (primary) hypertension: Secondary | ICD-10-CM | POA: Diagnosis not present

## 2021-11-08 DIAGNOSIS — F4323 Adjustment disorder with mixed anxiety and depressed mood: Secondary | ICD-10-CM

## 2021-11-08 LAB — GLUCOSE, CAPILLARY
Glucose-Capillary: 86 mg/dL (ref 70–99)
Glucose-Capillary: 123 mg/dL — ABNORMAL HIGH (ref 70–99)
Glucose-Capillary: 125 mg/dL — ABNORMAL HIGH (ref 70–99)
Glucose-Capillary: 121 mg/dL — ABNORMAL HIGH (ref 70–99)

## 2021-11-08 MED ORDER — EPOETIN ALFA 4000 UNIT/ML IJ SOLN
INTRAMUSCULAR | Status: AC
  Filled 2021-11-08: qty 1

## 2021-11-08 MED ORDER — HEPARIN SODIUM (PORCINE) 1000 UNIT/ML IJ SOLN
INTRAMUSCULAR | Status: AC
  Administered 2021-11-08: 4200 [IU]
  Filled 2021-11-08: qty 10

## 2021-11-08 MED ORDER — PROSOURCE PLUS PO LIQD
30.0000 mL | Freq: Two times a day (BID) | ORAL | Status: DC
  Administered 2021-11-09 – 2021-11-26 (×14): 30 mL via ORAL
  Filled 2021-11-08 (×40): qty 30

## 2021-11-08 NOTE — Progress Notes (Signed)
Nutrition Follow-up  DOCUMENTATION CODES:   Not applicable  INTERVENTION:   -Continue renal MVI daily -D/c Nepro shake -30 ml Prosource Plus BID, each supplement provides 100 kcals and 15 grams protein  NUTRITION DIAGNOSIS:   Increased nutrient needs related to catabolic illness (ESRD on HD) as evidenced by estimated needs.  Ongoing  GOAL:   Patient will meet greater than or equal to 90% of their needs  Progressing   MONITOR:   PO intake, Supplement acceptance, Labs, Weight trends, Skin, I & O's  REASON FOR ASSESSMENT:   Malnutrition Screening Tool    ASSESSMENT:   66 y/o female with h/o ESRD on HD, DM, HTN, homelessness and recent dog bite who is admitted with PNA, sepsis, cellulitis of left hand with septic arthropathy and ulnar fracture.   6/16- tap water enema provided secondary to constipation and fecal impaction 6/21- s/p MBSS- advanced to regular consistency diet   Reviewed I/O's: -3.7 L since 10/25/21  Pt unavailable at time of visit. Attempted to speak with pt via call to hospital room phone, however, unable to reach.   Pt's intake has improved. Noted meal completions 50-100%. Pt is refusing Nepro supplements.   Wt has been stable since admission.   Medications reviewed and include lasix, senokot, and lactulose.   Per TOC notes, pt is medically stable for discharge and awaiting SNF bed and HD placement.   Labs reviewed: Phos: 2.2, CBGS: 86-114 (inpatient orders for glycemic control are 0-15 units insulin aspart TID with meals).    Diet Order:   Diet Order             Diet regular Room service appropriate? Yes with Assist; Fluid consistency: Thin  Diet effective now                   EDUCATION NEEDS:   Not appropriate for education at this time  Skin:  Skin Assessment: Skin Integrity Issues: Skin Integrity Issues:: Other (Comment) Other: laceration on lt finger 2/2 dog bite  Last BM:  11/06/21  Height:   Ht Readings from Last 1  Encounters:  10/24/21 5\' 6"  (1.676 m)    Weight:   Wt Readings from Last 1 Encounters:  11/07/21 81.6 kg    Ideal Body Weight:  59 kg  BMI:  Body mass index is 29.04 kg/m.  Estimated Nutritional Needs:   Kcal:  2050-2250  Protein:  105-120 grams  Fluid:  1000 ml + UOP    Loistine Chance, RD, LDN, [place] Registered Dietitian II Certified Diabetes Care and Education Specialist Please refer to [facility] for RD and/or RD on-call/weekend/after hours pager

## 2021-11-08 NOTE — Progress Notes (Signed)
OT Cancellation Note  Patient Details Name: Honi Name MRN: [id number] DOB: [birth year]   Cancelled Treatment:    Reason Eval/Treat Not Completed: Other (comment). Per chart review, new orders for imaging to assess for possible L UE DVT. OT to hold and await results before engaging pt in therapeutic intervention.   Darleen Crocker, MS, OTR/L , CBIS ascom [phone]  11/08/21, 9:25 AM

## 2021-11-08 NOTE — Progress Notes (Signed)
This morning when doing my assessment patient was c/o not feeling well. Her left arm was more swollen than normal and was firm to the touch. Made MD aware, new order for [place].

## 2021-11-08 NOTE — Progress Notes (Signed)
  Progress Note   Patient: Katie [name] CBJ:[id number] DOB: Feb 11, 1956 DOA: 09/30/2021     16 DOS: the patient was seen and examined on 11/08/2021   Brief hospital course: Anye Brose is a 66 y.o. female with medical history significant of ESRD, DM II, homelessness. Had been admitted 05/13-05/17 for sepsis d/t cellulits from animal bite to arm, discharged on po abx and was unable to fill the Rx, was kicked out of where she was living, no way to get HD.  Patient has been in the ED since arriving the hospital.  She was admitted to the hospital again on 10/23/2021 as patient developed cough short of breath and fever.  Chest x-ray showed pneumonia.  Patient was diagnosed with sepsis and pneumonia, was started on cefepime and vancomycin. Had MRI performed on 6/14 did not show any osteomyelitis. Patient is currently homeless, has a severe weakness.  No discharge option at this time. 6/15.  Patient has no nausea, KUB showed fecal impaction with severe dilated colon, patient was started on senna as well as scheduled lactulose.  CT chest also showed dense right lower lobe consolidation consistent with aspiration pneumonia.  Shoulder x-ray does not show any osteomyelitis versus septic joint. Patient fecal impaction resolved after [place] on 6/17. Patient condition has stabilized, pending nursing home placement.  Assessment and Plan:  Left arm edema. Patient has a left leg edema for the last couple days, initially thought secondary to IV fluids.  But much worse today.  Will obtain ultrasound to rule out DVT.  Resolved Sepsis. Treated Healthcare associate pneumonia. Treated Recent dog bites with cellulitis. No new issues   Improved intractable nausea and vomiting Resolved fecal impaction. Chronic constipation Doing well, daily bowel movements.   End stage renal disease on dialysis, now MWF. Anemia of chronic kidney disease. Resolved hypokalemia. Stable on dialysis.  Type 2 diabetes. Continue  current regimen  Essential hypertension. Continue current treatment   Mild aortic stenosis. Follow-up with PCP as outpatient.   Possible depression Psychiatry consult ordered, pending.        Subjective:  Patient complaining of left arm swelling without redness.  Also complaining pain all over the body. Denies any shortness of breath.  Physical Exam: Vitals:   11/08/21 1230 11/08/21 1245 11/08/21 1300 11/08/21 1410  BP: 110/60 117/62 138/67 (!) 158/73  Pulse: 66 67 70 74  Resp: 12 11 13 16   Temp:   98.7 F (37.1 C) 98.2 F (36.8 C)  TempSrc:   Oral   SpO2: 98% 98% 99% 99%  Weight:      Height:       General exam: Appears calm and comfortable  Respiratory system: Crackles in the bases. Respiratory effort normal. Cardiovascular system: S1 & S2 heard, RRR. No JVD, murmurs, rubs, gallops or clicks. No pedal edema. Gastrointestinal system: Abdomen is nondistended, soft and nontender. No organomegaly or masses felt. Normal bowel sounds heard. Central nervous system: Alert and oriented. No focal neurological deficits. Extremities: Left arm swelling. Skin: No rashes, lesions or ulcers Psychiatry: Judgement and insight appear normal. Mood & affect appropriate.   Data Reviewed:  There are no new results to review at this time.  Family Communication:   Disposition: Status is: Inpatient Remains inpatient appropriate because: Unsafe for discharge  Planned Discharge Destination:  pending SNF    Time spent: 27 minutes  Author: Sharen Hones, MD 11/08/2021 2:59 PM  For on call review [email or web address].

## 2021-11-08 NOTE — Progress Notes (Signed)
[facility]  Dialysis Note   Subjective:   Patient seen and evaluated during dialysis   HEMODIALYSIS FLOWSHEET:  Blood Flow Rate (mL/min): 400 mL/min Arterial Pressure (mmHg): -190 mmHg Venous Pressure (mmHg): 120 mmHg Transmembrane Pressure (mmHg): 60 mmHg Ultrafiltration Rate (mL/min): 500 mL/min Dialysate Flow Rate (mL/min): 400 ml/min Conductivity: 13.9 Conductivity: 13.9 Dialysis Fluid Bolus: Normal Saline Bolus Amount (mL): 250 mL  Complains of generalized malaise  HD RN states patient refused to stand for weight, became tearful  Objective:  Vital signs in last 24 hours:  Temp:  [98 F (36.7 C)-99.4 F (37.4 C)] 98.6 F (37 C) (06/28 0939) Pulse Rate:  [64-84] 70 (06/28 1300) Resp:  [11-22] 13 (06/28 1300) BP: (102-170)/(48-69) 138/67 (06/28 1300) SpO2:  [89 %-99 %] 99 % (06/28 1300)  Weight change:  Filed Weights   11/06/21 0500 11/06/21 1524 11/07/21 0500  Weight: 84.4 kg 79.9 kg 81.6 kg    Intake/Output: No intake/output data recorded.   Intake/Output this shift:  Total I/O In: -  Out: 1000 [Other:1000]  Physical Exam: General: NAD  Head: Normocephalic, atraumatic. Moist oral mucosal membranes  Eyes: Anicteric  Lungs:  Clear to auscultation, normal effort  Heart: Regular rate and rhythm  Abdomen:  Soft, nontender  Extremities: No peripheral edema.  Left upper extremity edema  Neurologic: Nonfocal, moving all four extremities  Skin: No lesions  Access: Right PermCath    Basic Metabolic Panel: Recent Labs  Lab 11/03/21 1153 11/07/21 0504  NA 136 138  K 4.1 4.1  CL 99 101  CO2 31 30  GLUCOSE 124* 88  BUN 20 18  CREATININE 3.37* 2.84*  CALCIUM 8.8* 8.7*  PHOS 1.8* 2.2*     Liver Function Tests: Recent Labs  Lab 11/03/21 1153 11/07/21 0504  ALBUMIN 2.4* 2.5*    No results for input(s): "LIPASE", "AMYLASE" in the last 168 hours. No results for input(s): "AMMONIA" in the last 168 hours.  CBC: Recent Labs  Lab  11/03/21 1153 11/07/21 0504  WBC 9.8 6.9  HGB 8.9* 9.2*  HCT 29.4* 30.1*  MCV 96.1 96.5  PLT 292 296     Cardiac Enzymes: No results for input(s): "CKTOTAL", "CKMB", "CKMBINDEX", "TROPONINI" in the last 168 hours.  BNP: Invalid input(s): "POCBNP"  CBG: Recent Labs  Lab 11/06/21 2100 11/07/21 0819 11/07/21 1217 11/07/21 1605 11/08/21 0805  GLUCAP 111* 86 114* 99 86     Microbiology: Results for orders placed or performed during the hospital encounter of 09/30/21  Resp Panel by RT-PCR (Flu A&B, Covid) Nasopharyngeal Swab     Status: None   Collection Time: 09/30/21  9:34 AM   Specimen: Nasopharyngeal Swab; Nasopharyngeal(NP) swabs in vial transport medium  Result Value Ref Range Status   SARS Coronavirus 2 by RT PCR NEGATIVE NEGATIVE Final    Comment: (NOTE) SARS-CoV-2 target nucleic acids are NOT DETECTED.  The SARS-CoV-2 RNA is generally detectable in upper respiratory specimens during the acute phase of infection. The lowest concentration of SARS-CoV-2 viral copies this assay can detect is 138 copies/mL. A negative result does not preclude SARS-Cov-2 infection and should not be used as the sole basis for treatment or other patient management decisions. A negative result may occur with  improper specimen collection/handling, submission of specimen other than nasopharyngeal swab, presence of viral mutation(s) within the areas targeted by this assay, and inadequate number of viral copies(<138 copies/mL). A negative result must be combined with clinical observations, patient history, and epidemiological information. The expected result  is Negative.  Fact Sheet for Patients:  EntrepreneurPulse.com.au  Fact Sheet for Healthcare Providers:  IncredibleEmployment.be  This test is no t yet approved or cleared by the [place] FDA and  has been authorized for detection and/or diagnosis of SARS-CoV-2 by FDA under an Emergency Use  Authorization (EUA). This EUA will remain  in effect (meaning this test can be used) for the duration of the COVID-19 declaration under Section 564(b)(1) of the Act, 21 U.S.C.section 360bbb-3(b)(1), unless the authorization is terminated  or revoked sooner.       Influenza A by PCR NEGATIVE NEGATIVE Final   Influenza B by PCR NEGATIVE NEGATIVE Final    Comment: (NOTE) The Xpert Xpress SARS-CoV-2/FLU/RSV plus assay is intended as an aid in the diagnosis of influenza from Nasopharyngeal swab specimens and should not be used as a sole basis for treatment. Nasal washings and aspirates are unacceptable for Xpert Xpress SARS-CoV-2/FLU/RSV testing.  Fact Sheet for Patients: EntrepreneurPulse.com.au  Fact Sheet for Healthcare Providers: IncredibleEmployment.be  This test is not yet approved or cleared by the [place] FDA and has been authorized for detection and/or diagnosis of SARS-CoV-2 by FDA under an Emergency Use Authorization (EUA). This EUA will remain in effect (meaning this test can be used) for the duration of the COVID-19 declaration under Section 564(b)(1) of the Act, 21 U.S.C. section 360bbb-3(b)(1), unless the authorization is terminated or revoked.  Performed at [facility], [address] [place], [place] [address]   SARS Coronavirus 2 by RT PCR (hospital order, performed in [facility] hospital lab) *cepheid single result test* Anterior Nasal Swab     Status: None   Collection Time: 10/23/21  4:06 PM   Specimen: Anterior Nasal Swab  Result Value Ref Range Status   SARS Coronavirus 2 by RT PCR NEGATIVE NEGATIVE Final    Comment: (NOTE) SARS-CoV-2 target nucleic acids are NOT DETECTED.  The SARS-CoV-2 RNA is generally detectable in upper and lower respiratory specimens during the acute phase of infection. The lowest concentration of SARS-CoV-2 viral copies this assay can detect is 250 copies / mL. A negative  result does not preclude SARS-CoV-2 infection and should not be used as the sole basis for treatment or other patient management decisions.  A negative result may occur with improper specimen collection / handling, submission of specimen other than nasopharyngeal swab, presence of viral mutation(s) within the areas targeted by this assay, and inadequate number of viral copies (<250 copies / mL). A negative result must be combined with clinical observations, patient history, and epidemiological information.  Fact Sheet for Patients:   https://www.patel.info/  Fact Sheet for Healthcare Providers: https://hall.com/  This test is not yet approved or  cleared by the [place] FDA and has been authorized for detection and/or diagnosis of SARS-CoV-2 by FDA under an Emergency Use Authorization (EUA).  This EUA will remain in effect (meaning this test can be used) for the duration of the COVID-19 declaration under Section 564(b)(1) of the Act, 21 U.S.C. section 360bbb-3(b)(1), unless the authorization is terminated or revoked sooner.  Performed at [facility], [address] [place], [place] [address]   Blood culture (routine x 2)     Status: None   Collection Time: 10/23/21  4:06 PM   Specimen: BLOOD  Result Value Ref Range Status   Specimen Description BLOOD BLOOD RIGHT HAND  Final   Special Requests   Final    BOTTLES DRAWN AEROBIC AND ANAEROBIC Blood Culture adequate volume  Culture   Final    NO GROWTH 5 DAYS Performed at [facility], [address] [place], [place] [address]    Report Status 10/28/2021 FINAL  Final  Culture, blood (Routine X 2) w Reflex to ID Panel     Status: None   Collection Time: 10/24/21 12:06 AM   Specimen: BLOOD  Result Value Ref Range Status   Specimen Description BLOOD LEFT HAND  Final   Special Requests   Final    BOTTLES DRAWN AEROBIC AND ANAEROBIC Blood Culture adequate volume    Culture   Final    NO GROWTH 5 DAYS Performed at [facility], [address] [place], [place] [address]    Report Status 10/29/2021 FINAL  Final  MRSA Next Gen by PCR, Nasal     Status: None   Collection Time: 10/24/21  2:24 PM   Specimen: Nasal Mucosa; Nasal Swab  Result Value Ref Range Status   MRSA by PCR Next Gen NOT DETECTED NOT DETECTED Final    Comment: (NOTE) The GeneXpert MRSA Assay (FDA approved for NASAL specimens only), is one component of a comprehensive MRSA colonization surveillance program. It is not intended to diagnose MRSA infection nor to guide or monitor treatment for MRSA infections. Test performance is not FDA approved in patients less than 57 years old. Performed at [facility], [place], [place] [address]   Respiratory (~20 pathogens) panel by PCR     Status: None   Collection Time: 10/24/21  2:24 PM   Specimen: Nasopharyngeal Swab; Respiratory  Result Value Ref Range Status   Adenovirus NOT DETECTED NOT DETECTED Final   Coronavirus 229E NOT DETECTED NOT DETECTED Final    Comment: (NOTE) The Coronavirus on the Respiratory Panel, DOES NOT test for the novel  Coronavirus (2019 nCoV)    Coronavirus HKU1 NOT DETECTED NOT DETECTED Final   Coronavirus NL63 NOT DETECTED NOT DETECTED Final   Coronavirus OC43 NOT DETECTED NOT DETECTED Final   Metapneumovirus NOT DETECTED NOT DETECTED Final   Rhinovirus / Enterovirus NOT DETECTED NOT DETECTED Final   Influenza A NOT DETECTED NOT DETECTED Final   Influenza B NOT DETECTED NOT DETECTED Final   Parainfluenza Virus 1 NOT DETECTED NOT DETECTED Final   Parainfluenza Virus 2 NOT DETECTED NOT DETECTED Final   Parainfluenza Virus 3 NOT DETECTED NOT DETECTED Final   Parainfluenza Virus 4 NOT DETECTED NOT DETECTED Final   Respiratory Syncytial Virus NOT DETECTED NOT DETECTED Final   Bordetella pertussis NOT DETECTED NOT DETECTED Final   Bordetella Parapertussis NOT DETECTED NOT  DETECTED Final   Chlamydophila pneumoniae NOT DETECTED NOT DETECTED Final   Mycoplasma pneumoniae NOT DETECTED NOT DETECTED Final    Comment: Performed at [facility], [place]. [address], [place], [place] [address]    Coagulation Studies: No results for input(s): "LABPROT", "INR" in the last 72 hours.  Urinalysis: No results for input(s): "COLORURINE", "LABSPEC", "PHURINE", "GLUCOSEU", "HGBUR", "BILIRUBINUR", "KETONESUR", "PROTEINUR", "UROBILINOGEN", "NITRITE", "LEUKOCYTESUR" in the last 72 hours.  Invalid input(s): "APPERANCEUR"    Imaging: No results found.   Medications:      (feeding supplement) PROSource Plus  30 mL Oral BID BM   carvedilol  12.5 mg Oral QPM   Chlorhexidine Gluconate Cloth  6 each Topical Q0600   epoetin alfa       epoetin (EPOGEN/PROCRIT) injection  4,000 Units Intravenous Q M,W,F-HD   furosemide  40 mg Oral Daily   heparin  5,000 Units  Subcutaneous Q12H   insulin aspart  0-15 Units Subcutaneous TID WC   lactulose  20 g Oral BID   lubiprostone  24 mcg Oral BID WC   multivitamin  1 tablet Oral QHS   pantoprazole  40 mg Oral BID   senna-docusate  2 tablet Oral BID   sodium chloride flush  3 mL Intravenous Q12H   acetaminophen **OR** acetaminophen, albuterol, epoetin alfa, guaiFENesin, ondansetron (ZOFRAN) IV, oxyCODONE-acetaminophen, tiZANidine, topiramate  Assessment/ Plan:  [name] is a 66 y.o.  female with past medical history of ESRD with hemodialysis TTS, diabetes mellitus, hypertension to the hospital seeking long term placement. She was recently admitted and received antibiotics for a dog bite.  She was discharged with a prescription for home antibiotics.  She states her caregivers were unable to retrieve antibiotics.  She states that there was an argument and that she is here seeking long-term placement.  Principal Problem:   Adjustment disorder with mixed anxiety and depressed mood Active Problems:   Hypertension   Type 2  diabetes mellitus (HCC)   ESRD (end stage renal disease) ([place])   Financial difficulty   Anemia in chronic kidney disease   Sepsis ([place])   Cellulitis   Bent bone fracture of ulna   Hospital acquired PNA   Fecal impaction (HCC)   Aortic stenosis   [facility]/MWF/right PermCath  End Stage Renal Disease on hemodialysis:  Patient receiving scheduled dialysis today, UF goal 1 L as tolerated.  Patient received treatment seated in chair, tolerated well. We will continue to monitor discharge plan, waiting to hear from facility for evaluating patient. Next treatment scheduled for Friday, treatments will continue provided seated in recliner to prevent deconditioning.  No results found for: "POTASSIUM"  Intake/Output Summary (Last 24 hours) at 11/08/2021 1309 Last data filed at 11/08/2021 1252 Gross per 24 hour  Intake --  Output 1000 ml  Net -1000 ml     2. Hypertension with chronic kidney disease:   Remains on Carvedilol.  Blood pressure 138/67 during dialysis   BP 138/67 (BP Location: Right Arm)   Pulse 70   Temp 98.6 F (37 C) (Oral)   Resp 13   Ht 5\' 6"  (1.676 m)   Wt 81.6 kg   SpO2 99%   BMI 29.04 kg/m   3. Anemia of chronic kidney disease/ kidney injury/chronic disease/acute blood loss:   Lab Results  Component Value Date   HGB 9.2 (L) 11/07/2021  Hgb within desired target. Continue low-dose EPO with dialysis treatments.  4. Secondary Hyperparathyroidism:     Lab Results  Component Value Date   PTH 70 (H) 05/12/2020   CALCIUM 8.7 (L) 11/07/2021   PHOS 2.2 (L) 11/07/2021   Calcium remains within acceptable range.  Phosphorus remains decreased Continue to hold calcium acetate   5.  Severe constipation Managed with stool softeners. Encouraged patient to utilize non-narcotic medications to minimize reoccurrence.   No stools recorded.   LOS: [facility] 6/28/20231:09 PM

## 2021-11-08 NOTE — Progress Notes (Addendum)
Patient refused her noon insulin. Made MD aware of same.

## 2021-11-08 NOTE — Progress Notes (Signed)
Hemodialysis Post Treatment Note:  Tx date:11/08/2021 Tx time:3 hours Access: right CVC UF Removed: 1L  Note:  HD treatment completed. Tolerated well. No HD complications noted. Post HD BP 138/67

## 2021-11-08 NOTE — Progress Notes (Signed)
Physical Therapy Treatment Patient Details Name: Katie [name] MRN: [id number] DOB: [birth year] Today's Date: 11/08/2021   History of Present Illness Pt is a 66 y.o. female presenting to hospital 5/20 with concerns for worsening L hand pain d/t animal bite (pt recently admitted 5/13-5/17 for same reason). Pt's friend dropped her off at dialysis and refused to do any more (pt now homeless).  PMH includes ESRD on HD TTS, DM, htn, orthostatic hypotension, and h/o mandible surgery.    PT Comments    Pt was long sitting in bed upon arriving. She endorses feeling awful but was agreeable to get OOB to dialysis chair. Pt has swollen LUE, more so than previous date. MD ordered US. Pt was able to easily get OOB to recliner but does endorse feeling poorly overall. Pt has been somewhat self limiting throughout admission. Discussed need to increase activity throughout the day to improve independence.Highly recommend DC to SNF but if unable, will need 24/7 assistance. Acute PT will continue to follow and progress as able per current POC.     Recommendations for follow up therapy are one component of a multi-disciplinary discharge planning process, led by the attending physician.  Recommendations may be updated based on patient status, additional functional criteria and insurance authorization.  Follow Up Recommendations  Skilled nursing-short term rehab (<3 hours/day) Can patient physically be transported by private vehicle: Yes   Assistance Recommended at Discharge Frequent or constant Supervision/Assistance  Patient can return home with the following A little help with walking and/or transfers;A little help with bathing/dressing/bathroom;Assistance with cooking/housework;Assistance with feeding;Direct supervision/assist for medications management;Direct supervision/assist for financial management;Assist for transportation;Help with stairs or ramp for entrance   Equipment Recommendations  Other (comment)        Precautions / Restrictions Precautions Precautions: Fall Precaution Comments: R IJ permcath; Restrictions Weight Bearing Restrictions: No     Mobility  Bed Mobility Overal bed mobility: Modified Independent   Transfers Overall transfer level: Needs assistance Equipment used: Rolling walker (2 wheels) Transfers: Sit to/from Stand Sit to Stand: Supervision    General transfer comment: pt was not feeling well and elected not to ambulate due to LUE pain and "feeling weak." MD aware of LUE increased swelling. [place] ordered.    Ambulation/Gait Ambulation/Gait assistance: Min guard Gait Distance (Feet): 3 Feet Assistive device: Rolling walker (2 wheels) Gait Pattern/deviations: Step-through pattern Gait velocity: decreased     General Gait Details: pt easily and safely was able to take a few steps to recliner. Elevated not to advance gait distances due to LUE concerns and pt endorsing feeling so poorly.    Balance Overall balance assessment: Needs assistance Sitting-balance support: No upper extremity supported, Feet supported Sitting balance-Leahy Scale: Good     Standing balance support: Bilateral upper extremity supported, During functional activity, Reliant on assistive device for balance Standing balance-Leahy Scale: Fair       Cognition Arousal/Alertness: Awake/alert Behavior During Therapy: WFL for tasks assessed/performed Overall Cognitive Status: Within Functional Limits for tasks assessed    General Comments: pt is A and O however her mood was down throughout session. " I just dont feel that well."           General Comments General comments (skin integrity, edema, etc.): pt was on O2 throughout session. unable to wean due to desaturation with OOB activity.      Pertinent Vitals/Pain Pain Assessment Pain Assessment: 0-10 Pain Score: 7  Faces Pain Scale: Hurts even more Pain Location: LUE Pain Descriptors / Indicators: Grimacing,  Discomfort Pain  Intervention(s): Limited activity within patient's tolerance, Monitored during session, Premedicated before session, Repositioned     PT Goals (current goals can now be found in the care plan section) Acute Rehab PT Goals Patient Stated Goal: none stated Progress towards PT goals: Progressing toward goals    Frequency    Min 2X/week      PT Plan Current plan remains appropriate       AM-PAC PT "6 Clicks" Mobility   Outcome Measure  Help needed turning from your back to your side while in a flat bed without using bedrails?: None Help needed moving from lying on your back to sitting on the side of a flat bed without using bedrails?: A Little Help needed moving to and from a bed to a chair (including a wheelchair)?: A Little Help needed standing up from a chair using your arms (e.g., wheelchair or bedside chair)?: A Little Help needed to walk in hospital room?: A Little Help needed climbing 3-5 steps with a railing? : A Little 6 Click Score: 19    End of Session Equipment Utilized During Treatment: Oxygen Activity Tolerance: Patient tolerated treatment well Patient left: in chair;with call bell/phone within reach;with chair alarm set Nurse Communication: Mobility status PT Visit Diagnosis: Other abnormalities of gait and mobility (R26.89);Unsteadiness on feet (R26.81);Muscle weakness (generalized) (M62.81);History of falling (Z91.81);Pain Pain - Right/Left: Left Pain - part of body: Shoulder;Hand;Arm     Time: 0881-1031 PT Time Calculation (min) (ACUTE ONLY): 18 min  Charges:  $Therapeutic Activity: 8-22 mins                     Julaine Fusi PTA 11/08/21, 9:40 AM

## 2021-11-09 DIAGNOSIS — L03114 Cellulitis of left upper limb: Secondary | ICD-10-CM | POA: Diagnosis not present

## 2021-11-09 DIAGNOSIS — F4323 Adjustment disorder with mixed anxiety and depressed mood: Secondary | ICD-10-CM | POA: Diagnosis not present

## 2021-11-09 DIAGNOSIS — N186 End stage renal disease: Secondary | ICD-10-CM | POA: Diagnosis not present

## 2021-11-09 LAB — GLUCOSE, CAPILLARY
Glucose-Capillary: 88 mg/dL (ref 70–99)
Glucose-Capillary: 117 mg/dL — ABNORMAL HIGH (ref 70–99)
Glucose-Capillary: 98 mg/dL (ref 70–99)
Glucose-Capillary: 119 mg/dL — ABNORMAL HIGH (ref 70–99)

## 2021-11-09 MED ORDER — DOXYCYCLINE HYCLATE 100 MG PO TABS
100.0000 mg | ORAL_TABLET | Freq: Two times a day (BID) | ORAL | Status: AC
  Administered 2021-11-09 – 2021-11-18 (×19): 100 mg via ORAL
  Filled 2021-11-09 (×20): qty 1

## 2021-11-09 MED ORDER — CEPHALEXIN 500 MG PO CAPS
500.0000 mg | ORAL_CAPSULE | Freq: Two times a day (BID) | ORAL | Status: AC
  Administered 2021-11-09 – 2021-11-18 (×19): 500 mg via ORAL
  Filled 2021-11-09 (×20): qty 1

## 2021-11-09 NOTE — Progress Notes (Signed)
PT Cancellation Note  Patient Details Name: Katie [name] MRN: [id number] DOB: July 28, 1955   Cancelled Treatment:     PT attempt.2nd attempt this date. Refused earlier this date due to nausea.  Pt was working with OT. PT will continue to follow and progress as able per current POC.    Willette Pa 11/09/2021, 2:14 PM

## 2021-11-09 NOTE — Progress Notes (Signed)
Occupational Therapy Treatment Patient Details Name: Katie [name] MRN: [id number] DOB: 1956-02-03 Today's Date: 11/09/2021   History of present illness Pt is a 66 y.o. female presenting to hospital 5/20 with concerns for worsening L hand pain d/t animal bite (pt recently admitted 5/13-5/17 for same reason). Pt's friend dropped her off at dialysis and refused to do any more (pt now homeless).  PMH includes ESRD on HD TTS, DM, htn, orthostatic hypotension, and h/o mandible surgery.   OT comments  Pt seen for OT tx. Agreeable to bed level bathing due to LUE pain and swelling as well as dizziness. Pt required MOD A for UB bathing and MAX A for LB bathing from bed. Able to log roll and maintain side lying on R side for UB/LB bathing. LUE positioned on pillows to support comfort. Pt continues to benefit from skilled OT services.    Recommendations for follow up therapy are one component of a multi-disciplinary discharge planning process, led by the attending physician.  Recommendations may be updated based on patient status, additional functional criteria and insurance authorization.    Follow Up Recommendations  Skilled nursing-short term rehab (<3 hours/day)    Assistance Recommended at Discharge Intermittent Supervision/Assistance  Patient can return home with the following  A little help with walking and/or transfers;A lot of help with bathing/dressing/bathroom;Assistance with cooking/housework;Assist for transportation   Equipment Recommendations  BSC/3in1    Recommendations for Other Services      Precautions / Restrictions Precautions Precautions: Fall Precaution Comments: R IJ permcath; Restrictions Weight Bearing Restrictions: No       Mobility Bed Mobility Overal bed mobility: Modified Independent Bed Mobility: Rolling Rolling: Modified independent (Device/Increase time)         General bed mobility comments: rolling for bed level bathing (declined EOB)     Transfers                         Balance                                           ADL either performed or assessed with clinical judgement   ADL Overall ADL's : Needs assistance/impaired         Upper Body Bathing: Bed level;Moderate assistance   Lower Body Bathing: Bed level;Maximal assistance                              Extremity/Trunk Assessment              Vision       Perception     Praxis      Cognition Arousal/Alertness: Awake/alert Behavior During Therapy: WFL for tasks assessed/performed Overall Cognitive Status: Within Functional Limits for tasks assessed                                          Exercises      Shoulder Instructions       General Comments      Pertinent Vitals/ Pain       Pain Assessment Pain Assessment: Faces Faces Pain Scale: Hurts even more Pain Location: LUE Pain Descriptors / Indicators: Grimacing, Discomfort Pain Intervention(s): Limited activity within patient's tolerance, Monitored during session, Repositioned  Home Living                                          Prior Functioning/Environment              Frequency  Min 2X/week        Progress Toward Goals  OT Goals(current goals can now be found in the care plan section)  Progress towards OT goals: OT to reassess next treatment  Acute Rehab OT Goals Patient Stated Goal: to feel better OT Goal Formulation: With patient Time For Goal Achievement: 11/13/21 Potential to Achieve Goals: Good  Plan Discharge plan remains appropriate;Frequency remains appropriate    Co-evaluation                 AM-PAC OT "6 Clicks" Daily Activity     Outcome Measure   Help from another person eating meals?: A Little Help from another person taking care of personal grooming?: A Little Help from another person toileting, which includes using toliet, bedpan, or urinal?: A  Lot Help from another person bathing (including washing, rinsing, drying)?: A Lot Help from another person to put on and taking off regular upper body clothing?: A Little Help from another person to put on and taking off regular lower body clothing?: A Lot 6 Click Score: 15    End of Session    OT Visit Diagnosis: Unsteadiness on feet (R26.81) Pain - Right/Left: Left Pain - part of body: Arm   Activity Tolerance Patient tolerated treatment well;Patient limited by pain   Patient Left in bed;with call bell/phone within reach   Nurse Communication          Time: 1350-1401 OT Time Calculation (min): 11 min  Charges: OT General Charges $OT Visit: 1 Visit OT Treatments $Self Care/Home Management : 8-22 mins  Ardeth Perfect., MPH, MS, OTR/L ascom [phone] 11/09/21, 2:06 PM

## 2021-11-09 NOTE — TOC Progression Note (Addendum)
Transition of Care [facility]) - Progression Note    Patient Details  Name: Daja Shuping MRN: [id number] Date of Birth: 20-Nov-1955  Transition of Care [facility]) CM/SW [place], RN Phone Number: 11/09/2021, 3:30 PM  Clinical Narrative:    As per Maudie Mercury at [facility], they are able to take patient as long as dialysis is MWF after 10 am and has submitted authorization for managed medicaid.  [name] at [facility] dialysis aware, and will contact facility.  RNCM also faxed to [facility] and [facility] in [place] as back up.     Expected Discharge Plan: Skilled Nursing Facility Barriers to Discharge: SNF Pending bed offer  Expected Discharge Plan and Services Expected Discharge Plan: [place]   Discharge Planning Services: CM Consult Post Acute Care Choice: [place] Living arrangements for the past 2 months: Single Family Home                 DME Arranged: N/A DME Agency: NA       HH Arranged: NA HH Agency: NA         Social Determinants of Health (SDOH) Interventions    Readmission Risk Interventions    09/27/2021    1:44 PM 09/26/2021   12:46 PM 03/08/2021    2:49 PM  Readmission Risk Prevention Plan  Transportation Screening  Complete Complete  Palliative Care Screening Not Applicable    Medication Review (RN Care Manager)  Complete Complete  HRI or Home Care Consult   Complete  SW Recovery Care/Counseling Consult  Complete   Palliative Care Screening  Not Applicable   [place]  Not Applicable Complete

## 2021-11-09 NOTE — Progress Notes (Signed)
Continuing to follow for dialysis placement.

## 2021-11-09 NOTE — Progress Notes (Signed)
Progress Note   Patient: Katie [name] OEU:[id number] DOB: Jan 15, 1956 DOA: 09/30/2021     17 DOS: the patient was seen and examined on 11/09/2021   Brief hospital course: Katie [name] is a 66 y.o. female with medical history significant of ESRD, DM II, homelessness. Had been admitted 05/13-05/17 for sepsis d/t cellulits from animal bite to arm, discharged on po abx and was unable to fill the Rx, was kicked out of where she was living, no way to get HD.  Patient has been in the ED since arriving the hospital.  She was admitted to the hospital again on 10/23/2021 as patient developed cough short of breath and fever.  Chest x-ray showed pneumonia.  Patient was diagnosed with sepsis and pneumonia, was started on cefepime and vancomycin. Had MRI performed on 6/14 did not show any osteomyelitis. Patient is currently homeless, has a severe weakness.  No discharge option at this time. 6/15.  Patient has no nausea, KUB showed fecal impaction with severe dilated colon, patient was started on senna as well as scheduled lactulose.  CT chest also showed dense right lower lobe consolidation consistent with aspiration pneumonia.  Shoulder x-ray does not show any osteomyelitis versus septic joint. Patient fecal impaction resolved after Nulytely on 6/17. Patient condition has stabilized, pending nursing home placement. 2/29.  Patient had a significant left arm swelling, duplex ultrasound did not show DVT.  Become red today, started antibiotics with doxycycline and Keflex for 5 days  Assessment and Plan: Left arm cellulitis Patient has a left leg edema for the last couple days, initially thought secondary to IV fluids.  Daughter worsening edema, duplex ultrasound was performed, no DVT.  It became red today, it appears to be cellulitis.  We will treat for 5 days with Keflex and doxycycline.    Resolved Sepsis. Treated Healthcare associate pneumonia. Treated Recent dog bites with cellulitis. No new issues    Improved intractable nausea and vomiting Resolved fecal impaction. Chronic constipation Doing well, daily bowel movements.   End stage renal disease on dialysis, now MWF. Anemia of chronic kidney disease. Resolved hypokalemia. Stable on dialysis.  Type 2 diabetes. Continue current regimen  Essential hypertension. Continue current treatment   Mild aortic stenosis. Follow-up with PCP as outpatient.   Possible depression Psychiatry consult ordered, pending.      Subjective:  Patient still has significant left arm swelling, but become red today.  No fever or chills  Physical Exam: Vitals:   11/08/21 2007 11/09/21 0500 11/09/21 0522 11/09/21 0927  BP: 128/61  137/62 (!) 152/73  Pulse: 70  82 89  Resp: 16  18 18   Temp: 98.1 F (36.7 C)  98.7 F (37.1 C) 99.2 F (37.3 C)  TempSrc:      SpO2: 93%  91% 92%  Weight:  80.2 kg    Height:       General exam: Appears calm and comfortable  Respiratory system: Clear to auscultation. Respiratory effort normal. Cardiovascular system: S1 & S2 heard, RRR. No JVD, murmurs, rubs, gallops or clicks. No pedal edema. Gastrointestinal system: Abdomen is nondistended, soft and nontender. No organomegaly or masses felt. Normal bowel sounds heard. Central nervous system: Alert and oriented. No focal neurological deficits. Extremities: Left arm swelling and red Skin: No rashes, lesions or ulcers Psychiatry: Judgement and insight appear normal. Mood & affect appropriate.   Data Reviewed:  No new lab results, reviewed ultrasound results.  Family Communication:   Disposition: Status is: Inpatient Remains inpatient appropriate because: No discharge options.  Planned Discharge Destination:  pending    Time spent: 35 minutes  Author: Sharen Hones, MD 11/09/2021 11:24 AM  For on call review [email or web address].

## 2021-11-10 DIAGNOSIS — E1122 Type 2 diabetes mellitus with diabetic chronic kidney disease: Secondary | ICD-10-CM | POA: Diagnosis not present

## 2021-11-10 DIAGNOSIS — N186 End stage renal disease: Secondary | ICD-10-CM | POA: Diagnosis not present

## 2021-11-10 DIAGNOSIS — F4323 Adjustment disorder with mixed anxiety and depressed mood: Secondary | ICD-10-CM | POA: Diagnosis not present

## 2021-11-10 DIAGNOSIS — L03114 Cellulitis of left upper limb: Secondary | ICD-10-CM | POA: Diagnosis not present

## 2021-11-10 LAB — GLUCOSE, CAPILLARY
Glucose-Capillary: 97 mg/dL (ref 70–99)
Glucose-Capillary: 112 mg/dL — ABNORMAL HIGH (ref 70–99)
Glucose-Capillary: 122 mg/dL — ABNORMAL HIGH (ref 70–99)

## 2021-11-10 LAB — CBC
HCT: 33 % — ABNORMAL LOW (ref 36.0–46.0)
Hemoglobin: 10 g/dL — ABNORMAL LOW (ref 12.0–15.0)
MCH: 29.2 pg (ref 26.0–34.0)
MCHC: 30.3 g/dL (ref 30.0–36.0)
MCV: 96.2 fL (ref 80.0–100.0)
Platelets: 338 10*3/uL (ref 150–400)
RBC: 3.43 MIL/uL — ABNORMAL LOW (ref 3.87–5.11)
RDW: 15.6 % — ABNORMAL HIGH (ref 11.5–15.5)
WBC: 8.3 10*3/uL (ref 4.0–10.5)
nRBC: 0 % (ref 0.0–0.2)

## 2021-11-10 LAB — RENAL FUNCTION PANEL
Albumin: 2.9 g/dL — ABNORMAL LOW (ref 3.5–5.0)
Anion gap: 9 (ref 5–15)
BUN: 25 mg/dL — ABNORMAL HIGH (ref 8–23)
CO2: 27 mmol/L (ref 22–32)
Calcium: 8.9 mg/dL (ref 8.9–10.3)
Chloride: 101 mmol/L (ref 98–111)
Creatinine, Ser: 4.78 mg/dL — ABNORMAL HIGH (ref 0.44–1.00)
GFR, Estimated: 10 mL/min — ABNORMAL LOW (ref >60)
Glucose, Bld: 124 mg/dL — ABNORMAL HIGH (ref 70–99)
Phosphorus: 3 mg/dL (ref 2.5–4.6)
Potassium: 4.1 mmol/L (ref 3.5–5.1)
Sodium: 137 mmol/L (ref 135–145)

## 2021-11-10 MED ORDER — CITALOPRAM HYDROBROMIDE 20 MG PO TABS
10.0000 mg | ORAL_TABLET | Freq: Every day | ORAL | Status: DC
  Administered 2021-11-10 – 2021-11-27 (×17): 10 mg via ORAL
  Filled 2021-11-10 (×17): qty 1

## 2021-11-10 MED ORDER — EPOETIN ALFA 4000 UNIT/ML IJ SOLN
INTRAMUSCULAR | Status: AC
  Filled 2021-11-10: qty 1

## 2021-11-10 NOTE — Progress Notes (Signed)
Started referral to [facility] for SNF placement. Requested a MWF second shift chair, however admissions seemed doubtful that this will be possible. MWF chairs are hard to come by, admissions is also not certain that patient insurance is within network. Will follow up later today.

## 2021-11-10 NOTE — TOC Progression Note (Signed)
Transition of Care [facility]) - Progression Note    Patient Details  Name: Anushka Hartinger MRN: [id number] Date of Birth: [birth year]  Transition of Care [facility]) CM/SW [place], RN Phone Number: 11/10/2021, 2:27 PM  Clinical Narrative:   Maudie Mercury notified RNCM that we have authorization for transfer.  As per Ferrel Logan to find out if dialysis is in network before transfer. Estill Bamberg will call Kim at [facility] on Monday to coordinate potential transfer.  Kim at [employer]    Expected Discharge Plan: [place] Barriers to Discharge: SNF Pending bed offer  Expected Discharge Plan and Services Expected Discharge Plan: [place]   Discharge Planning Services: CM Consult Post Acute Care Choice: [place] Living arrangements for the past 2 months: Single Family Home                 DME Arranged: N/A DME Agency: NA       HH Arranged: NA HH Agency: NA         Social Determinants of Health (SDOH) Interventions    Readmission Risk Interventions    09/27/2021    1:44 PM 09/26/2021   12:46 PM 03/08/2021    2:49 PM  Readmission Risk Prevention Plan  Transportation Screening  Complete Complete  Palliative Care Screening Not Applicable    Medication Review (RN Care Manager)  Complete Complete  HRI or Home Care Consult   Complete  SW Recovery Care/Counseling Consult  Complete   Palliative Care Screening  Not Applicable   [place]  Not Applicable Complete

## 2021-11-10 NOTE — Progress Notes (Signed)
  Progress Note   Patient: Katie [name] RCV:[id number] DOB: Oct 06, 1955 DOA: 09/30/2021     18 DOS: the patient was seen and examined on 11/10/2021   Brief hospital course: Katie [name] is a 66 y.o. female with medical history significant of ESRD, DM II, homelessness. Had been admitted 05/13-05/17 for sepsis d/t cellulits from animal bite to arm, discharged on po abx and was unable to fill the Rx, was kicked out of where she was living, no way to get HD.  Patient has been in the ED since arriving the hospital.  She was admitted to the hospital again on 10/23/2021 as patient developed cough short of breath and fever.  Chest x-ray showed pneumonia.  Patient was diagnosed with sepsis and pneumonia, was started on cefepime and vancomycin. Had MRI performed on 6/14 did not show any osteomyelitis. Patient is currently homeless, has a severe weakness.  No discharge option at this time. 6/15.  Patient has no nausea, KUB showed fecal impaction with severe dilated colon, patient was started on senna as well as scheduled lactulose.  CT chest also showed dense right lower lobe consolidation consistent with aspiration pneumonia.  Shoulder x-ray does not show any osteomyelitis versus septic joint. Patient fecal impaction resolved after Nulytely on 6/17. Patient condition has stabilized, pending nursing home placement. 2/29.  Patient had a significant left arm swelling, duplex ultrasound did not show DVT.  Become red today, started antibiotics with doxycycline and Keflex for 5 days  Assessment and Plan: Left arm cellulitis Condition improving, continue Augmentin and doxycycline.     Resolved Sepsis. Treated Healthcare associate pneumonia. Treated Recent dog bites with cellulitis. Improved   Improved intractable nausea and vomiting Resolved fecal impaction. Chronic constipation Condition has resolved.   End stage renal disease on dialysis, now MWF. Anemia of chronic kidney disease. Resolved  hypokalemia. Stable on dialysis.  Type 2 diabetes. Continue current regimen  Essential hypertension. Continue current treatment   Mild aortic stenosis. Follow-up with PCP as outpatient.   Possible depression Start lower dose Celexa.        Subjective:  Patient condition stable, currently no complaints.  Left arm swelling is getting better.  Physical Exam: Vitals:   11/09/21 1619 11/09/21 1954 11/10/21 0511 11/10/21 0839  BP: (!) 167/68 128/61 (!) 151/72 (!) 171/72  Pulse: 80 72 78 77  Resp: 16 18 16 18   Temp: 98.2 F (36.8 C) 99.5 F (37.5 C) 98 F (36.7 C) 98.8 F (37.1 C)  TempSrc:      SpO2: 93% 91% 94% 98%  Weight:   80.4 kg   Height:       General exam: Appears calm and comfortable  Respiratory system: Clear to auscultation. Respiratory effort normal. Cardiovascular system: S1 & S2 heard, RRR. No JVD, murmurs, rubs, gallops or clicks. No pedal edema. Gastrointestinal system: Abdomen is nondistended, soft and nontender. No organomegaly or masses felt. Normal bowel sounds heard. Central nervous system: Alert and oriented. No focal neurological deficits. Extremities: Left arm swelling with some redness. Skin: No rashes, lesions or ulcers Psychiatry: Judgement and insight appear normal. Mood & affect appropriate.   Data Reviewed:  There are no new results to review at this time.  Family Communication: none  Disposition: Status is: Inpatient Remains inpatient appropriate because: No discharge options.  Planned Discharge Destination: Skilled nursing facility    Time spent: 35 minutes  Author: Sharen Hones, MD 11/10/2021 12:41 PM  For on call review [email or web address].

## 2021-11-10 NOTE — Progress Notes (Signed)
[facility]  Dialysis Note   Subjective:   Patient seen resting in bed, covers over head Alert and oriented Tolerating meals Continues to complain of generalized malaise.  Dialysis scheduled for later today, with patient seated in chair.  Objective:  Vital signs in last 24 hours:  Temp:  [98 F (36.7 C)-99.5 F (37.5 C)] 98.8 F (37.1 C) (06/30 0839) Pulse Rate:  [72-80] 77 (06/30 0839) Resp:  [16-18] 18 (06/30 0839) BP: (128-171)/(61-72) 171/72 (06/30 0839) SpO2:  [91 %-98 %] 98 % (06/30 0839) Weight:  [80.4 kg] 80.4 kg (06/30 0511)  Weight change: 0.2 kg Filed Weights   11/09/21 0500 11/10/21 0511  Weight: 80.2 kg 80.4 kg    Intake/Output: I/O last 3 completed shifts: In: 6 [I.V.:6] Out: 1 [Stool:1]   Intake/Output this shift:  No intake/output data recorded.  Physical Exam: General: NAD  Head: Normocephalic, atraumatic. Moist oral mucosal membranes  Eyes: Anicteric  Lungs:  Clear to auscultation, normal effort  Heart: Regular rate and rhythm  Abdomen:  Soft, nontender  Extremities: No peripheral edema.  Left upper extremity edema  Neurologic: Nonfocal, moving all four extremities  Skin: No lesions  Access: Right PermCath    Basic Metabolic Panel: Recent Labs  Lab 11/07/21 0504  NA 138  K 4.1  CL 101  CO2 30  GLUCOSE 88  BUN 18  CREATININE 2.84*  CALCIUM 8.7*  PHOS 2.2*     Liver Function Tests: Recent Labs  Lab 11/07/21 0504  ALBUMIN 2.5*    No results for input(s): "LIPASE", "AMYLASE" in the last 168 hours. No results for input(s): "AMMONIA" in the last 168 hours.  CBC: Recent Labs  Lab 11/07/21 0504  WBC 6.9  HGB 9.2*  HCT 30.1*  MCV 96.5  PLT 296     Cardiac Enzymes: No results for input(s): "CKTOTAL", "CKMB", "CKMBINDEX", "TROPONINI" in the last 168 hours.  BNP: Invalid input(s): "POCBNP"  CBG: Recent Labs  Lab 11/09/21 1132 11/09/21 1617 11/09/21 1959 11/10/21 0842 11/10/21 1144  GLUCAP 117*  98 119* 97 112*     Microbiology: Results for orders placed or performed during the hospital encounter of 09/30/21  Resp Panel by RT-PCR (Flu A&B, Covid) Nasopharyngeal Swab     Status: None   Collection Time: 09/30/21  9:34 AM   Specimen: Nasopharyngeal Swab; Nasopharyngeal(NP) swabs in vial transport medium  Result Value Ref Range Status   SARS Coronavirus 2 by RT PCR NEGATIVE NEGATIVE Final    Comment: (NOTE) SARS-CoV-2 target nucleic acids are NOT DETECTED.  The SARS-CoV-2 RNA is generally detectable in upper respiratory specimens during the acute phase of infection. The lowest concentration of SARS-CoV-2 viral copies this assay can detect is 138 copies/mL. A negative result does not preclude SARS-Cov-2 infection and should not be used as the sole basis for treatment or other patient management decisions. A negative result may occur with  improper specimen collection/handling, submission of specimen other than nasopharyngeal swab, presence of viral mutation(s) within the areas targeted by this assay, and inadequate number of viral copies(<138 copies/mL). A negative result must be combined with clinical observations, patient history, and epidemiological information. The expected result is Negative.  Fact Sheet for Patients:  EntrepreneurPulse.com.au  Fact Sheet for Healthcare Providers:  IncredibleEmployment.be  This test is no t yet approved or cleared by the [place] FDA and  has been authorized for detection and/or diagnosis of SARS-CoV-2 by FDA under an Emergency Use Authorization (EUA). This EUA will remain  in  effect (meaning this test can be used) for the duration of the COVID-19 declaration under Section 564(b)(1) of the Act, 21 U.S.C.section 360bbb-3(b)(1), unless the authorization is terminated  or revoked sooner.       Influenza A by PCR NEGATIVE NEGATIVE Final   Influenza B by PCR NEGATIVE NEGATIVE Final    Comment:  (NOTE) The Xpert Xpress SARS-CoV-2/FLU/RSV plus assay is intended as an aid in the diagnosis of influenza from Nasopharyngeal swab specimens and should not be used as a sole basis for treatment. Nasal washings and aspirates are unacceptable for Xpert Xpress SARS-CoV-2/FLU/RSV testing.  Fact Sheet for Patients: EntrepreneurPulse.com.au  Fact Sheet for Healthcare Providers: IncredibleEmployment.be  This test is not yet approved or cleared by the [place] FDA and has been authorized for detection and/or diagnosis of SARS-CoV-2 by FDA under an Emergency Use Authorization (EUA). This EUA will remain in effect (meaning this test can be used) for the duration of the COVID-19 declaration under Section 564(b)(1) of the Act, 21 U.S.C. section 360bbb-3(b)(1), unless the authorization is terminated or revoked.  Performed at [facility], [address] [place], [place] [address]   SARS Coronavirus 2 by RT PCR (hospital order, performed in [facility] hospital lab) *cepheid single result test* Anterior Nasal Swab     Status: None   Collection Time: 10/23/21  4:06 PM   Specimen: Anterior Nasal Swab  Result Value Ref Range Status   SARS Coronavirus 2 by RT PCR NEGATIVE NEGATIVE Final    Comment: (NOTE) SARS-CoV-2 target nucleic acids are NOT DETECTED.  The SARS-CoV-2 RNA is generally detectable in upper and lower respiratory specimens during the acute phase of infection. The lowest concentration of SARS-CoV-2 viral copies this assay can detect is 250 copies / mL. A negative result does not preclude SARS-CoV-2 infection and should not be used as the sole basis for treatment or other patient management decisions.  A negative result may occur with improper specimen collection / handling, submission of specimen other than nasopharyngeal swab, presence of viral mutation(s) within the areas targeted by this assay, and inadequate number of viral  copies (<250 copies / mL). A negative result must be combined with clinical observations, patient history, and epidemiological information.  Fact Sheet for Patients:   https://www.patel.info/  Fact Sheet for Healthcare Providers: https://hall.com/  This test is not yet approved or  cleared by the [place] FDA and has been authorized for detection and/or diagnosis of SARS-CoV-2 by FDA under an Emergency Use Authorization (EUA).  This EUA will remain in effect (meaning this test can be used) for the duration of the COVID-19 declaration under Section 564(b)(1) of the Act, 21 U.S.C. section 360bbb-3(b)(1), unless the authorization is terminated or revoked sooner.  Performed at [facility], [address] [place], [place] [address]   Blood culture (routine x 2)     Status: None   Collection Time: 10/23/21  4:06 PM   Specimen: BLOOD  Result Value Ref Range Status   Specimen Description BLOOD BLOOD RIGHT HAND  Final   Special Requests   Final    BOTTLES DRAWN AEROBIC AND ANAEROBIC Blood Culture adequate volume   Culture   Final    NO GROWTH 5 DAYS Performed at [facility], [address] [place], [place] [address]    Report Status 10/28/2021 FINAL  Final  Culture, blood (Routine X 2) w Reflex to ID Panel     Status: None   Collection Time: 10/24/21 12:06 AM  Specimen: BLOOD  Result Value Ref Range Status   Specimen Description BLOOD LEFT HAND  Final   Special Requests   Final    BOTTLES DRAWN AEROBIC AND ANAEROBIC Blood Culture adequate volume   Culture   Final    NO GROWTH 5 DAYS Performed at [facility], [address] [place], [place] [address]    Report Status 10/29/2021 FINAL  Final  MRSA Next Gen by PCR, Nasal     Status: None   Collection Time: 10/24/21  2:24 PM   Specimen: Nasal Mucosa; Nasal Swab  Result Value Ref Range Status   MRSA by PCR Next Gen NOT DETECTED NOT DETECTED Final     Comment: (NOTE) The GeneXpert MRSA Assay (FDA approved for NASAL specimens only), is one component of a comprehensive MRSA colonization surveillance program. It is not intended to diagnose MRSA infection nor to guide or monitor treatment for MRSA infections. Test performance is not FDA approved in patients less than 48 years old. Performed at [facility], [place], [place] [address]   Respiratory (~20 pathogens) panel by PCR     Status: None   Collection Time: 10/24/21  2:24 PM   Specimen: Nasopharyngeal Swab; Respiratory  Result Value Ref Range Status   Adenovirus NOT DETECTED NOT DETECTED Final   Coronavirus 229E NOT DETECTED NOT DETECTED Final    Comment: (NOTE) The Coronavirus on the Respiratory Panel, DOES NOT test for the novel  Coronavirus (2019 nCoV)    Coronavirus HKU1 NOT DETECTED NOT DETECTED Final   Coronavirus NL63 NOT DETECTED NOT DETECTED Final   Coronavirus OC43 NOT DETECTED NOT DETECTED Final   Metapneumovirus NOT DETECTED NOT DETECTED Final   Rhinovirus / Enterovirus NOT DETECTED NOT DETECTED Final   Influenza A NOT DETECTED NOT DETECTED Final   Influenza B NOT DETECTED NOT DETECTED Final   Parainfluenza Virus 1 NOT DETECTED NOT DETECTED Final   Parainfluenza Virus 2 NOT DETECTED NOT DETECTED Final   Parainfluenza Virus 3 NOT DETECTED NOT DETECTED Final   Parainfluenza Virus 4 NOT DETECTED NOT DETECTED Final   Respiratory Syncytial Virus NOT DETECTED NOT DETECTED Final   Bordetella pertussis NOT DETECTED NOT DETECTED Final   Bordetella Parapertussis NOT DETECTED NOT DETECTED Final   Chlamydophila pneumoniae NOT DETECTED NOT DETECTED Final   Mycoplasma pneumoniae NOT DETECTED NOT DETECTED Final    Comment: Performed at [facility], [place]. [address], [place], [place] [address]    Coagulation Studies: No results for input(s): "LABPROT", "INR" in the last 72 hours.  Urinalysis: No results for input(s): "COLORURINE", "LABSPEC",  "PHURINE", "GLUCOSEU", "HGBUR", "BILIRUBINUR", "KETONESUR", "PROTEINUR", "UROBILINOGEN", "NITRITE", "LEUKOCYTESUR" in the last 72 hours.  Invalid input(s): "APPERANCEUR"    Imaging: US Venous Img Upper Uni Left (DVT)  Result Date: 11/08/2021 CLINICAL DATA:  Left upper extremity pain and edema. Patient is anticoagulation. Evaluate for DVT. End-stage renal disease with right jugular approach dialysis catheter. EXAM: LEFT UPPER EXTREMITY VENOUS DOPPLER ULTRASOUND TECHNIQUE: Gray-scale sonography with graded compression, as well as color Doppler and duplex ultrasound were performed to evaluate the upper extremity deep venous system from the level of the subclavian vein and including the jugular, axillary, basilic, radial, ulnar and upper cephalic vein. Spectral Doppler was utilized to evaluate flow at rest and with distal augmentation maneuvers. COMPARISON:  None Available. FINDINGS: Contralateral Subclavian Vein: Respiratory phasicity is normal and symmetric with the symptomatic side. No evidence of thrombus. Normal compressibility. Internal Jugular Vein: No evidence of thrombus.  Normal compressibility, respiratory phasicity and response to augmentation. Subclavian Vein: No evidence of thrombus. Normal compressibility, respiratory phasicity and response to augmentation. Axillary Vein: No evidence of thrombus. Normal compressibility, respiratory phasicity and response to augmentation. Cephalic Vein: No evidence of thrombus. Normal compressibility, respiratory phasicity and response to augmentation. Basilic Vein: No evidence of thrombus. Normal compressibility, respiratory phasicity and response to augmentation. Brachial Veins: No evidence of thrombus. Normal compressibility, respiratory phasicity and response to augmentation. Radial Veins: No evidence of thrombus. Normal compressibility, respiratory phasicity and response to augmentation. Ulnar Veins: No evidence of thrombus. Normal compressibility, respiratory  phasicity and response to augmentation. Other Findings: There is a minimal amount of subcutaneous edema at the level forearm. IMPRESSION: No evidence of DVT within the left upper extremity. Electronically Signed   By: Sandi Mariscal M.D.   On: 11/08/2021 16:32     Medications:      (feeding supplement) PROSource Plus  30 mL Oral BID BM   carvedilol  12.5 mg Oral QPM   cephALEXin  500 mg Oral Q12H   Chlorhexidine Gluconate Cloth  6 each Topical Q0600   doxycycline  100 mg Oral Q12H   epoetin (EPOGEN/PROCRIT) injection  4,000 Units Intravenous Q M,W,F-HD   furosemide  40 mg Oral Daily   heparin  5,000 Units Subcutaneous Q12H   lactulose  20 g Oral BID   lubiprostone  24 mcg Oral BID WC   multivitamin  1 tablet Oral QHS   pantoprazole  40 mg Oral BID   senna-docusate  2 tablet Oral BID   sodium chloride flush  3 mL Intravenous Q12H   acetaminophen **OR** acetaminophen, albuterol, guaiFENesin, ondansetron (ZOFRAN) IV, oxyCODONE-acetaminophen, tiZANidine, topiramate  Assessment/ Plan:  Katie [name] is a 66 y.o.  female with past medical history of ESRD with hemodialysis TTS, diabetes mellitus, hypertension to the hospital seeking long term placement. She was recently admitted and received antibiotics for a dog bite.  She was discharged with a prescription for home antibiotics.  She states her caregivers were unable to retrieve antibiotics.  She states that there was an argument and that she is here seeking long-term placement.  Principal Problem:   Adjustment disorder with mixed anxiety and depressed mood Active Problems:   Hypertension   Type 2 diabetes mellitus (HCC)   ESRD (end stage renal disease) ([place])   Financial difficulty   Anemia in chronic kidney disease   Sepsis ([place])   Cellulitis   Bent bone fracture of ulna   Hospital acquired PNA   Fecal impaction (HCC)   Aortic stenosis   Left arm cellulitis   [facility]/MWF/right PermCath  End Stage Renal Disease  on hemodialysis:  Scheduled to receive dialysis later today, seated in chair.  UF goal 1 L as tolerated.  Patiently currently below stated estimated dry weight.  We will determine new dry weight prior to discharge.  Next treatment scheduled for Monday. Continuing to monitor discharge planning and placement to determine outpatient needs.  Multiple barriers exist however continuing to seek options.  No results found for: "POTASSIUM"  Intake/Output Summary (Last 24 hours) at 11/10/2021 1237 Last data filed at 11/09/2021 2245 Gross per 24 hour  Intake 3 ml  Output --  Net 3 ml     2. Hypertension with chronic kidney disease:   Blood pressure slightly elevated today, 171/72.  Patient remains on carvedilol and furosemide only.   BP (!) 171/72 (BP Location: Right Arm)   Pulse 77   Temp 98.8  F (37.1 C)   Resp 18   Ht 5\' 6"  (1.676 m)   Wt 80.4 kg   SpO2 98%   BMI 28.61 kg/m   3. Anemia of chronic kidney disease/ kidney injury/chronic disease/acute blood loss:   Lab Results  Component Value Date   HGB 9.2 (L) 11/07/2021  Hemoglobin remains within acceptable range. We will continue low-dose EPO with dialysis treatments.  4. Secondary Hyperparathyroidism:     Lab Results  Component Value Date   PTH 70 (H) 05/12/2020   CALCIUM 8.7 (L) 11/07/2021   PHOS 2.2 (L) 11/07/2021   Calcium remains within desired goal.  We will continue to hold calcium acetate due to decreased phosphorus levels.  5.  Severe constipation Managed with stool softeners. Encouraged patient to utilize non-narcotic medications to minimize reoccurrence.   Patient prescribed daily stool softeners and laxatives as needed.   LOS: [facility] 6/30/202312:37 PM

## 2021-11-10 NOTE — Progress Notes (Signed)
PT Cancellation Note  Patient Details Name: Katie [name] MRN: [id number] DOB: 07/30/1955   Cancelled Treatment:    Reason Eval/Treat Not Completed: Patient at procedure or test/unavailable.  Chart reviewed and attempted to see pt.  Pt currently at HD at this time.  Will re-attempt at later date/time as medically appropriate.   Gwenlyn Saran, PT, DPT 11/10/21, 2:14 PM

## 2021-11-11 DIAGNOSIS — L03114 Cellulitis of left upper limb: Secondary | ICD-10-CM | POA: Diagnosis not present

## 2021-11-11 DIAGNOSIS — F4323 Adjustment disorder with mixed anxiety and depressed mood: Secondary | ICD-10-CM | POA: Diagnosis not present

## 2021-11-11 DIAGNOSIS — N186 End stage renal disease: Secondary | ICD-10-CM | POA: Diagnosis not present

## 2021-11-11 LAB — GLUCOSE, CAPILLARY
Glucose-Capillary: 73 mg/dL (ref 70–99)
Glucose-Capillary: 134 mg/dL — ABNORMAL HIGH (ref 70–99)

## 2021-11-11 NOTE — Progress Notes (Signed)
  Progress Note   Patient: Katie [name] ZGY:[id number] DOB: 1956-03-21 DOA: 09/30/2021     19 DOS: the patient was seen and examined on 11/11/2021   Brief hospital course: Natashia Roseman is a 66 y.o. female with medical history significant of ESRD, DM II, homelessness. Had been admitted 05/13-05/17 for sepsis d/t cellulits from animal bite to arm, discharged on po abx and was unable to fill the Rx, was kicked out of where she was living, no way to get HD.  Patient has been in the ED since arriving the hospital.  She was admitted to the hospital again on 10/23/2021 as patient developed cough short of breath and fever.  Chest x-ray showed pneumonia.  Patient was diagnosed with sepsis and pneumonia, was started on cefepime and vancomycin. Had MRI performed on 6/14 did not show any osteomyelitis. Patient is currently homeless, has a severe weakness.  No discharge option at this time. 6/15.  Patient has no nausea, KUB showed fecal impaction with severe dilated colon, patient was started on senna as well as scheduled lactulose.  CT chest also showed dense right lower lobe consolidation consistent with aspiration pneumonia.  Shoulder x-ray does not show any osteomyelitis versus septic joint. Patient fecal impaction resolved after [place] on 6/17. Patient condition has stabilized, pending nursing home placement. 6/29.  Patient had a significant left arm swelling, duplex ultrasound did not show DVT.  Become red today, started antibiotics with doxycycline and Keflex for 5 days  Assessment and Plan: Left arm cellulitis Left arm swelling much improved.  Continue antibiotics until 7/4.     Resolved Sepsis. Treated Healthcare associate pneumonia. Treated Recent dog bites with cellulitis. Improved   Improved intractable nausea and vomiting Resolved fecal impaction. Chronic constipation Condition has resolved.   End stage renal disease on dialysis, now MWF. Anemia of chronic kidney disease. Resolved  hypokalemia. Stable on dialysis.  Type 2 diabetes. Continue current regimen  Essential hypertension. Continue current treatment   Mild aortic stenosis. Follow-up with PCP as outpatient.   Possible depression Start lower dose Celexa.        Subjective:  Patient doing well, left arm swelling much better.  Physical Exam: Vitals:   11/10/21 1705 11/10/21 2043 11/11/21 0521 11/11/21 0809  BP: (!) 163/73 139/61 (!) 159/69 140/61  Pulse: 78 73 79 79  Resp: 18 19 16 16   Temp: 98.1 F (36.7 C) 99 F (37.2 C) 98.3 F (36.8 C) 98.6 F (37 C)  TempSrc:      SpO2: 99% 97% 94% 94%  Weight:      Height:       General exam: Appears calm and comfortable  Respiratory system: Clear to auscultation. Respiratory effort normal. Cardiovascular system: S1 & S2 heard, RRR. 2/6 SM. No pedal edema. Gastrointestinal system: Abdomen is nondistended, soft and nontender. No organomegaly or masses felt. Normal bowel sounds heard. Central nervous system: Alert and oriented. No focal neurological deficits. Extremities: Left arm swelling Skin: No rashes, lesions or ulcers Psychiatry: Judgement and insight appear normal. Mood & affect appropriate.   Data Reviewed:  There are no new results to review at this time.  Family Communication: None  Disposition: Status is: Inpatient Remains inpatient appropriate because: Unsafe discharge  Planned Discharge Destination: Skilled nursing facility    Time spent: 26 minutes  Author: Sharen Hones, MD 11/11/2021 10:34 AM  For on call review [email or web address].

## 2021-11-12 DIAGNOSIS — L03114 Cellulitis of left upper limb: Secondary | ICD-10-CM | POA: Diagnosis not present

## 2021-11-12 DIAGNOSIS — N186 End stage renal disease: Secondary | ICD-10-CM | POA: Diagnosis not present

## 2021-11-12 LAB — GLUCOSE, CAPILLARY: Glucose-Capillary: 91 mg/dL (ref 70–99)

## 2021-11-12 MED ORDER — PROCHLORPERAZINE EDISYLATE 10 MG/2ML IJ SOLN
5.0000 mg | Freq: Once | INTRAMUSCULAR | Status: AC
  Administered 2021-11-12: 5 mg via INTRAVENOUS
  Filled 2021-11-12: qty 1

## 2021-11-12 NOTE — Progress Notes (Signed)
Physical Therapy Treatment Patient Details Name: Katie [name] MRN: [id number] DOB: 13-Aug-1955 Today's Date: 11/12/2021   History of Present Illness Pt is a 65 y.o. female presenting to hospital 5/20 with concerns for worsening L hand pain d/t animal bite (pt recently admitted 5/13-5/17 for same reason). Pt's friend dropped her off at dialysis and refused to do any more (pt now homeless).  PMH includes ESRD on HD TTS, DM, htn, orthostatic hypotension, and h/o mandible surgery.    PT Comments    Pt seen for PT tx with pt initially declining participation 2/2 N&V but agreeable to bed level exercises. Pt performed exercises noted below with min cuing for technique; pt encouraged to perform exercises while in bed. Will continue to follow pt acutely to progress mobility as able.    Recommendations for follow up therapy are one component of a multi-disciplinary discharge planning process, led by the attending physician.  Recommendations may be updated based on patient status, additional functional criteria and insurance authorization.  Follow Up Recommendations  Skilled nursing-short term rehab (<3 hours/day) Can patient physically be transported by private vehicle: Yes   Assistance Recommended at Discharge Frequent or constant Supervision/Assistance  Patient can return home with the following A little help with walking and/or transfers;A little help with bathing/dressing/bathroom;Assistance with cooking/housework;Assistance with feeding;Direct supervision/assist for medications management;Direct supervision/assist for financial management;Assist for transportation;Help with stairs or ramp for entrance   Equipment Recommendations       Recommendations for Other Services       Precautions / Restrictions Precautions Precautions: Fall Precaution Comments: R IJ permcath; Restrictions Weight Bearing Restrictions: No     Mobility  Bed Mobility                    Transfers                         Ambulation/Gait                   Stairs             Wheelchair Mobility    Modified Rankin (Stroke Patients Only)       Balance                                            Cognition Arousal/Alertness: Awake/alert Behavior During Therapy: WFL for tasks assessed/performed Overall Cognitive Status: Within Functional Limits for tasks assessed                                          Exercises General Exercises - Lower Extremity Heel Slides: AROM, Strengthening, Both, 10 reps, Supine Hip ABduction/ADduction: AROM, Strengthening, Both, 10 reps, Supine (hip abduction slides x 10, hip adduction pillow squeezes x 10) Straight Leg Raises: AROM, Strengthening, Both, 10 reps, Supine    General Comments        Pertinent Vitals/Pain Pain Assessment Pain Assessment: No/denies pain    Home Living                          Prior Function            PT Goals (current goals can now be found in the care plan section) Acute Rehab  PT Goals Patient Stated Goal: none stated PT Goal Formulation: With patient Time For Goal Achievement: 11/21/21 Potential to Achieve Goals: Good Progress towards PT goals: Progressing toward goals    Frequency    Min 2X/week      PT Plan Current plan remains appropriate    Co-evaluation              AM-PAC PT "6 Clicks" Mobility   Outcome Measure  Help needed turning from your back to your side while in a flat bed without using bedrails?: None Help needed moving from lying on your back to sitting on the side of a flat bed without using bedrails?: A Little Help needed moving to and from a bed to a chair (including a wheelchair)?: A Little Help needed standing up from a chair using your arms (e.g., wheelchair or bedside chair)?: A Little Help needed to walk in hospital room?: A Little Help needed climbing 3-5 steps with a railing? : A Little 6 Click  Score: 19    End of Session   Activity Tolerance: Patient tolerated treatment well Patient left: in bed;with call bell/phone within reach   PT Visit Diagnosis: Other abnormalities of gait and mobility (R26.89);Unsteadiness on feet (R26.81);Muscle weakness (generalized) (M62.81);History of falling (Z91.81)     Time: 6811-5726 PT Time Calculation (min) (ACUTE ONLY): 9 min  Charges:  $Therapeutic Exercise: 8-22 mins                     Lavone Nian, PT, DPT 11/12/21, 2:10 PM   Waunita Schooner 11/12/2021, 2:09 PM

## 2021-11-12 NOTE — Progress Notes (Incomplete)
       CROSS COVER NOTE  NAME: Katie [name] MRN: [id number] DOB : [birth year]    Date of Service   11/12/21  HPI/Events of Note   "Pt states she is nauseous after getting zofran at 1650. Can she get additional one time or prn nausea med?"  Interventions   Plan: Compazine x1 X X      This document was prepared using Dragon voice recognition software and may include unintentional dictation errors.  Neomia Glass DNP, MHA, FNP-BC Nurse Practitioner [facility] Pager [phone]

## 2021-11-12 NOTE — Progress Notes (Signed)
  Progress Note   Patient: Katie [name] GGY:[id number] DOB: 1956-05-07 DOA: 09/30/2021     20 DOS: the patient was seen and examined on 11/12/2021   Brief hospital course: Sidni Fusco is a 66 y.o. female with medical history significant of ESRD, DM II, homelessness. Had been admitted 05/13-05/17 for sepsis d/t cellulits from animal bite to arm, discharged on po abx and was unable to fill the Rx, was kicked out of where she was living, no way to get HD.  Patient has been in the ED since arriving the hospital.  She was admitted to the hospital again on 10/23/2021 as patient developed cough short of breath and fever.  Chest x-ray showed pneumonia.  Patient was diagnosed with sepsis and pneumonia, was started on cefepime and vancomycin. Had MRI performed on 6/14 did not show any osteomyelitis. Patient is currently homeless, has a severe weakness.  No discharge option at this time. 6/15.  Patient has no nausea, KUB showed fecal impaction with severe dilated colon, patient was started on senna as well as scheduled lactulose.  CT chest also showed dense right lower lobe consolidation consistent with aspiration pneumonia.  Shoulder x-ray does not show any osteomyelitis versus septic joint. Patient fecal impaction resolved after [place] on 6/17. Patient condition has stabilized, pending nursing home placement. 6/29.  Patient had a significant left arm swelling, duplex ultrasound did not show DVT.  Become red today, started antibiotics with doxycycline and Keflex for 5 days  Assessment and Plan: Left arm cellulitis  Continue antibiotics until 7/4.  Left arm swelling much better today.     Resolved Sepsis. Treated [facility] associate pneumonia. Treated Recent dog bites with cellulitis. Resolved.   Improved intractable nausea and vomiting Resolved fecal impaction. Chronic constipation Improved.   End stage renal disease on dialysis, now MWF. Anemia of chronic kidney disease. Resolved  hypokalemia. Dialysis per nephrology.  Type 2 diabetes with occasional hypoglycemia Insulin discontinued.  Check glucose twice a day.  Essential hypertension. Continue current treatment   Mild aortic stenosis. Follow-up with PCP as outpatient.   Possible depression Start lower dose Celexa.      Subjective:  Patient doing much better, no short of breath.  Left arm swelling much better.  Physical Exam: Vitals:   11/11/21 2037 11/12/21 0500 11/12/21 0507 11/12/21 0809  BP: (!) 132/58  130/71 (!) 156/72  Pulse: 74  72 81  Resp: 17  16 18   Temp: 98 F (36.7 C)  98.3 F (36.8 C) 98.1 F (36.7 C)  TempSrc:      SpO2: 90%  92% 90%  Weight:  78.6 kg    Height:       General exam: Appears calm and comfortable  Respiratory system: Clear to auscultation. Respiratory effort normal. Cardiovascular system: S1 & S2 heard, RRR. No JVD, murmurs, rubs, gallops or clicks. No pedal edema. Gastrointestinal system: Abdomen is nondistended, soft and nontender. No organomegaly or masses felt. Normal bowel sounds heard. Central nervous system: Alert and oriented. No focal neurological deficits. Extremities: Arm swelling improving Skin: No rashes, lesions or ulcers Psychiatry: Judgement and insight appear normal. Mood & affect appropriate.   Data Reviewed:  There are no new results to review at this time.  Family Communication: None  Disposition: Status is: Inpatient Remains inpatient appropriate because: Unsafe discharge.  Planned Discharge Destination: Skilled nursing facility    Time spent: 25 minutes  Author: Sharen Hones, MD 11/12/2021 1:21 PM  For on call review [email or web address].

## 2021-11-13 DIAGNOSIS — F4323 Adjustment disorder with mixed anxiety and depressed mood: Secondary | ICD-10-CM | POA: Diagnosis not present

## 2021-11-13 DIAGNOSIS — N186 End stage renal disease: Secondary | ICD-10-CM | POA: Diagnosis not present

## 2021-11-13 DIAGNOSIS — L03114 Cellulitis of left upper limb: Secondary | ICD-10-CM | POA: Diagnosis not present

## 2021-11-13 LAB — RENAL FUNCTION PANEL
Albumin: 2.5 g/dL — ABNORMAL LOW (ref 3.5–5.0)
Anion gap: 7 (ref 5–15)
BUN: 34 mg/dL — ABNORMAL HIGH (ref 8–23)
CO2: 28 mmol/L (ref 22–32)
Calcium: 8.2 mg/dL — ABNORMAL LOW (ref 8.9–10.3)
Chloride: 106 mmol/L (ref 98–111)
Creatinine, Ser: 5.67 mg/dL — ABNORMAL HIGH (ref 0.44–1.00)
GFR, Estimated: 8 mL/min — ABNORMAL LOW (ref >60)
Glucose, Bld: 113 mg/dL — ABNORMAL HIGH (ref 70–99)
Phosphorus: 3.6 mg/dL (ref 2.5–4.6)
Potassium: 3.7 mmol/L (ref 3.5–5.1)
Sodium: 141 mmol/L (ref 135–145)

## 2021-11-13 LAB — CBC
HCT: 30 % — ABNORMAL LOW (ref 36.0–46.0)
Hemoglobin: 9 g/dL — ABNORMAL LOW (ref 12.0–15.0)
MCH: 29.4 pg (ref 26.0–34.0)
MCHC: 30 g/dL (ref 30.0–36.0)
MCV: 98 fL (ref 80.0–100.0)
Platelets: 266 10*3/uL (ref 150–400)
RBC: 3.06 MIL/uL — ABNORMAL LOW (ref 3.87–5.11)
RDW: 15.9 % — ABNORMAL HIGH (ref 11.5–15.5)
WBC: 5.9 10*3/uL (ref 4.0–10.5)
nRBC: 0 % (ref 0.0–0.2)

## 2021-11-13 MED ORDER — PROCHLORPERAZINE EDISYLATE 10 MG/2ML IJ SOLN
5.0000 mg | Freq: Once | INTRAMUSCULAR | Status: AC
Start: 2021-11-13 — End: 2021-11-13
  Administered 2021-11-13: 5 mg via INTRAVENOUS
  Filled 2021-11-13: qty 1

## 2021-11-13 MED ORDER — HEPARIN SODIUM (PORCINE) 1000 UNIT/ML IJ SOLN
INTRAMUSCULAR | Status: AC
  Administered 2021-11-13: 1000 [IU]
  Filled 2021-11-13: qty 10

## 2021-11-13 MED ORDER — EPOETIN ALFA 10000 UNIT/ML IJ SOLN
INTRAMUSCULAR | Status: AC
  Filled 2021-11-13: qty 1

## 2021-11-13 NOTE — Evaluation (Signed)
Occupational Therapy Re-Evaluation Patient Details Name: Katie [name] MRN: [id number] DOB: Sep 20, 1955 Today's Date: 11/13/2021   History of Present Illness Pt is a 66 y.o. female presenting to hospital 5/20 with concerns for worsening L hand pain d/t animal bite (pt recently admitted 5/13-5/17 for same reason). Pt's friend dropped her off at dialysis and refused to do any more (pt now homeless).  PMH includes ESRD on HD TTS, DM, htn, orthostatic hypotension, and h/o mandible surgery.   Clinical Impression   Pt seen for OT re-evaluation and tx this date. Pt endorses 6/10 abdominal, LUE, and neck pain but declines for OT to share with RN because she does not want to take pain medication for it. Pt completed bed mobility with supervision, increased time needed to sit EOB so pt could limit risk of dizziness per her report. Pt then completed step pivot transfer to [facility] declining RW to assist and requiring CGA. Slight LOB noted requiring CGA while attempting to stand without UE support to doff briefs in preparation for sitting on BSC. Pt had large loose BM. She endorsed that at baseline she was able to complete LB ADL including pericare by herself but not well. Pt required MAX A for pericare this date in standing with BUE support on RW and notable improvement in balance. Limited standing tolerance due to pain and slight dizziness. SpO2 >92% throughout on room air. RN notified. Pt continues to demonstrate impairments in strength, balance, LUE functional use due to swelling/pain, and decr activity tolerance resulting in increased need for assist for bathing, dressing, and toileting as well as ADL mobility. Continue to recommend SNF for short term rehab.      Recommendations for follow up therapy are one component of a multi-disciplinary discharge planning process, led by the attending physician.  Recommendations may be updated based on patient status, additional functional criteria and insurance authorization.    Follow Up Recommendations  Skilled nursing-short term rehab (<3 hours/day)    Assistance Recommended at Discharge Intermittent Supervision/Assistance  Patient can return home with the following A little help with walking and/or transfers;A lot of help with bathing/dressing/bathroom;Assistance with cooking/housework;Assist for transportation    Functional Status Assessment  Patient has had a recent decline in their functional status and demonstrates the ability to make significant improvements in function in a reasonable and predictable amount of time.  Equipment Recommendations  BSC/3in1    Recommendations for Other Services       Precautions / Restrictions Precautions Precautions: Fall Precaution Comments: R IJ permcath; Restrictions Weight Bearing Restrictions: No      Mobility Bed Mobility Overal bed mobility: Needs Assistance Bed Mobility: Supine to Sit, Sit to Supine     Supine to sit: Supervision Sit to supine: Supervision   General bed mobility comments: increased time/effort, moving slow bc pt concerned about dizziness    Transfers Overall transfer level: Needs assistance Equipment used: None Transfers: Bed to chair/wheelchair/BSC       Step pivot transfers: Min guard     General transfer comment: step pivot to [facility]      Balance Overall balance assessment: Needs assistance Sitting-balance support: No upper extremity supported, Feet supported Sitting balance-Leahy Scale: Good     Standing balance support: Single extremity supported, No upper extremity supported, During functional activity Standing balance-Leahy Scale: Fair Standing balance comment: slight LOB with initial stand attempt from EOB  ADL either performed or assessed with clinical judgement   ADL Overall ADL's : Needs assistance/impaired                         Toilet Transfer: Min Academic librarian Details  (indicate cue type and reason): step pivot with handheld assist using bed rail to [place] and Hygiene: Maximal assistance;Sit to/from stand Toileting - Clothing Manipulation Details (indicate cue type and reason): pt tolerated standing briefly with RW but required MAX A for pericare             Vision         Perception     Praxis      Pertinent Vitals/Pain Pain Assessment Pain Assessment: 0-10 Pain Score: 6  Pain Location: stomach, LUE, neck/head Pain Descriptors / Indicators: Grimacing, Discomfort Pain Intervention(s): Limited activity within patient's tolerance, Monitored during session, Repositioned (pt declined OT to tell RN)     Hand Dominance Right   Extremity/Trunk Assessment Upper Extremity Assessment Upper Extremity Assessment: Generalized weakness;Overall WFL for tasks assessed LUE Deficits / Details: LUE pain limited but pt able to use LUE To help her stand and doff briefs   Lower Extremity Assessment Lower Extremity Assessment: Generalized weakness       Communication Communication Communication: No difficulties   Cognition Arousal/Alertness: Awake/alert Behavior During Therapy: WFL for tasks assessed/performed Overall Cognitive Status: Within Functional Limits for tasks assessed                                       General Comments       Exercises     Shoulder Instructions      Home Living Family/patient expects to be discharged to:: Skilled nursing facility                                 Additional Comments: pt was living with a friend PTA, reports this is no longer a feasible living situation; pt is requesting STR      Prior Functioning/Environment Prior Level of Function : Independent/Modified Independent             Mobility Comments: 2 SPC for mobility ADLs Comments: MOD I in ADL completion, performs bathing at the sink. Does not drive, history of falls (1 approx 6  months ago per chart review, pt reports multiple approx 1 year ago and frequent LOB)        OT Problem List: Decreased strength;Decreased range of motion;Impaired balance (sitting and/or standing);Decreased knowledge of use of DME or AE;Impaired UE functional use;Pain;Increased edema      OT Treatment/Interventions: Self-care/ADL training;Therapeutic exercise;DME and/or AE instruction;Therapeutic activities;Patient/family education;Balance training    OT Goals(Current goals can be found in the care plan section) Acute Rehab OT Goals Patient Stated Goal: feel better and be more independent OT Goal Formulation: With patient Time For Goal Achievement: 11/27/21 Potential to Achieve Goals: Good  OT Frequency: Min 2X/week    Co-evaluation              AM-PAC OT "6 Clicks" Daily Activity     Outcome Measure Help from another person eating meals?: A Little Help from another person taking care of personal grooming?: A Little Help from another person toileting, which includes using toliet, bedpan, or urinal?: A Lot Help from another  person bathing (including washing, rinsing, drying)?: A Lot Help from another person to put on and taking off regular upper body clothing?: A Little Help from another person to put on and taking off regular lower body clothing?: A Lot 6 Click Score: 15   End of Session Equipment Utilized During Treatment: Rolling walker (2 wheels)  Activity Tolerance: Treatment limited secondary to medical complications (Comment);Patient tolerated treatment well;Patient limited by pain (dizziness/pain) Patient left: in bed;with call bell/phone within reach;with bed alarm set  OT Visit Diagnosis: Unsteadiness on feet (R26.81) Pain - Right/Left: Left Pain - part of body: Arm                Time: 8614-8307 OT Time Calculation (min): 14 min Charges:  OT General Charges $OT Visit: 1 Visit OT Evaluation $OT Re-eval: 1 Re-eval OT Treatments $Self Care/Home Management :  8-22 mins  Ardeth Perfect., MPH, MS, OTR/L ascom [phone] 11/13/21, 3:26 PM

## 2021-11-13 NOTE — Progress Notes (Signed)
OT Cancellation Note  Patient Details Name: Katie [name] MRN: [id number] DOB: 02-23-1956   Cancelled Treatment:    Reason Eval/Treat Not Completed: Other (comment). Pt exiting room with staff for HD. Pt agreeable to OT re-attempt in the afternoon.   Ardeth Perfect., MPH, MS, OTR/L ascom [phone] 11/13/21, 9:29 AM

## 2021-11-13 NOTE — Progress Notes (Addendum)
Physical Therapy Treatment Patient Details Name: Katie [name] MRN: [id number] DOB: 04-Nov-1955 Today's Date: 11/13/2021   History of Present Illness Pt is a 66 y.o. female presenting to hospital 5/20 with concerns for worsening L hand pain d/t animal bite (pt recently admitted 5/13-5/17 for same reason). Pt's friend dropped her off at dialysis and refused to do any more (pt now homeless).  PMH includes ESRD on HD TTS, DM, htn, orthostatic hypotension, and h/o mandible surgery.    PT Comments    Pt in bed, stating she needs to void.  Attempted to set up bathroom but she refused wanting BSC.  Education on importance of mobility but she continued to request BSC "It's just easier".  OOB with min a x 1 to BSC to void.  She is generally disheveled in appearance.  While on commode she agreed to bathing.  "I can't help much".  She does wash upper body.  When asked if disposable underwear and pad needed to be changed she said no.  Pad was clearly dirty and a new one was given.  Hair very tangled and assisted with combing out.  Stood and encouraged pt to wash peri area but she did a generally poor job and assistance was given.  Refused further gait at this time.  Returned to bed per her request to eat breakfast as tray arrived before getting in dialysis chair.  Pt was pleasant and appreciative of assist.  She does request therapy to come on non-dialysis days.  Scheduled MWF.  Will accommodate as staffing and schedule allows.   Recommendations for follow up therapy are one component of a multi-disciplinary discharge planning process, led by the attending physician.  Recommendations may be updated based on patient status, additional functional criteria and insurance authorization.  Follow Up Recommendations  Skilled nursing-short term rehab (<3 hours/day) Can patient physically be transported by private vehicle: Yes   Assistance Recommended at Discharge Frequent or constant Supervision/Assistance  Patient  can return home with the following A little help with walking and/or transfers;A little help with bathing/dressing/bathroom;Assistance with cooking/housework;Assistance with feeding;Direct supervision/assist for medications management;Direct supervision/assist for financial management;Assist for transportation;Help with stairs or ramp for entrance   Equipment Recommendations  Other (comment)    Recommendations for Other Services       Precautions / Restrictions Precautions Precautions: Fall Precaution Comments: R IJ permcath; Restrictions Weight Bearing Restrictions: No     Mobility  Bed Mobility Overal bed mobility: Needs Assistance Bed Mobility: Supine to Sit     Supine to sit: Min assist Sit to supine: Min guard        Transfers Overall transfer level: Needs assistance Equipment used: None Transfers: Sit to/from Stand Sit to Stand: Min assist   Step pivot transfers: Min guard            Ambulation/Gait                   Stairs             Wheelchair Mobility    Modified Rankin (Stroke Patients Only)       Balance Overall balance assessment: Needs assistance Sitting-balance support: No upper extremity supported, Feet supported Sitting balance-Leahy Scale: Good     Standing balance support: Bilateral upper extremity supported, During functional activity, Reliant on assistive device for balance Standing balance-Leahy Scale: Fair  Cognition Arousal/Alertness: Awake/alert   Overall Cognitive Status: Within Functional Limits for tasks assessed                                          Exercises Other Exercises Other Exercises: bathing on commode at bedside    General Comments        Pertinent Vitals/Pain Pain Assessment Pain Assessment: Faces Faces Pain Scale: Hurts little more Pain Location: LUE Pain Descriptors / Indicators: Grimacing, Discomfort Pain  Intervention(s): Limited activity within patient's tolerance, Monitored during session    Home Living                          Prior Function            PT Goals (current goals can now be found in the care plan section) Progress towards PT goals: Progressing toward goals    Frequency    Min 2X/week      PT Plan Current plan remains appropriate    Co-evaluation              AM-PAC PT "6 Clicks" Mobility   Outcome Measure  Help needed turning from your back to your side while in a flat bed without using bedrails?: None Help needed moving from lying on your back to sitting on the side of a flat bed without using bedrails?: A Little Help needed moving to and from a bed to a chair (including a wheelchair)?: A Little Help needed standing up from a chair using your arms (e.g., wheelchair or bedside chair)?: A Little Help needed to walk in hospital room?: A Little Help needed climbing 3-5 steps with a railing? : A Lot 6 Click Score: 18    End of Session Equipment Utilized During Treatment: Oxygen Activity Tolerance: Patient tolerated treatment well Patient left: in bed;with call bell/phone within reach Nurse Communication: Mobility status PT Visit Diagnosis: Other abnormalities of gait and mobility (R26.89);Unsteadiness on feet (R26.81);Muscle weakness (generalized) (M62.81);History of falling (Z91.81) Pain - part of body: Shoulder;Hand;Arm     Time: 8366-2947 PT Time Calculation (min) (ACUTE ONLY): 32 min  Charges:  $Therapeutic Activity: 23-37 mins                   Chesley Noon, PTA 11/13/21, 9:52 AM

## 2021-11-13 NOTE — Progress Notes (Incomplete)
       CROSS COVER NOTE  NAME: Katie [name] MRN: [id number] DOB : [birth year]    Date of Service   11/13/21  HPI/Events of Note   Nausea refractory to zofran ? Not requiring meds during day? Diet*** Emptying Eval*** .Marland KitchenMarland KitchenReglan/Gastroparesis? 6/18: Impacted ***  Interventions   Plan: EKG for qTC evaluation Compazine Recent Type 7 stools support ongoing impaction. Consider Enema/SMOG + Daily Bowel Regimen       This document was prepared using Dragon voice recognition software and may include unintentional dictation errors.  Neomia Glass DNP, MHA, FNP-BC Nurse Practitioner [facility] Pager [phone]

## 2021-11-13 NOTE — Progress Notes (Signed)
[facility]  Dialysis Note   Subjective:   Patient seen and evaluated during dialysis   HEMODIALYSIS FLOWSHEET:  Blood Flow Rate (mL/min): 400 mL/min Arterial Pressure (mmHg): -200 mmHg Venous Pressure (mmHg): 180 mmHg Transmembrane Pressure (mmHg): 70 mmHg Ultrafiltration Rate (mL/min): 500 mL/min Dialysate Flow Rate (mL/min): 500 ml/min Conductivity: 13.6 Conductivity: 13.6 Dialysis Fluid Bolus: Normal Saline Bolus Amount (mL): 250 mL  Currently tolerating treatment well seated in chair. No complaints at this time  Objective:  Vital signs in last 24 hours:  Temp:  [97.9 F (36.6 C)-99 F (37.2 C)] 98 F (36.7 C) (07/03 0926) Pulse Rate:  [64-79] 65 (07/03 1115) Resp:  [10-22] 18 (07/03 1045) BP: (138-164)/(55-82) 142/74 (07/03 1115) SpO2:  [90 %-100 %] 98 % (07/03 0820) Weight:  [76.3 kg-79.2 kg] 76.3 kg (07/03 0928)  Weight change: 0.6 kg Filed Weights   11/12/21 0500 11/13/21 0500 11/13/21 0928  Weight: 78.6 kg 79.2 kg 76.3 kg    Intake/Output: I/O last 3 completed shifts: In: 3 [I.V.:3] Out: -    Intake/Output this shift:  No intake/output data recorded.  Physical Exam: General: NAD  Head: Normocephalic, atraumatic. Moist oral mucosal membranes  Eyes: Anicteric  Lungs:  Clear to auscultation, normal effort  Heart: Regular rate and rhythm  Abdomen:  Soft, nontender  Extremities: No peripheral edema.  Left upper extremity edema  Neurologic: Nonfocal, moving all four extremities  Skin: No lesions  Access: Right PermCath    Basic Metabolic Panel: Recent Labs  Lab 11/07/21 0504 11/10/21 1243 11/13/21 0945  NA 138 137 141  K 4.1 4.1 3.7  CL 101 101 106  CO2 30 27 28   GLUCOSE 88 124* 113*  BUN 18 25* 34*  CREATININE 2.84* 4.78* 5.67*  CALCIUM 8.7* 8.9 8.2*  PHOS 2.2* 3.0 3.6     Liver Function Tests: Recent Labs  Lab 11/07/21 0504 11/10/21 1243 11/13/21 0945  ALBUMIN 2.5* 2.9* 2.5*    No results for input(s):  "LIPASE", "AMYLASE" in the last 168 hours. No results for input(s): "AMMONIA" in the last 168 hours.  CBC: Recent Labs  Lab 11/07/21 0504 11/10/21 1252 11/13/21 0945  WBC 6.9 8.3 5.9  HGB 9.2* 10.0* 9.0*  HCT 30.1* 33.0* 30.0*  MCV 96.5 96.2 98.0  PLT 296 338 266     Cardiac Enzymes: No results for input(s): "CKTOTAL", "CKMB", "CKMBINDEX", "TROPONINI" in the last 168 hours.  BNP: Invalid input(s): "POCBNP"  CBG: Recent Labs  Lab 11/10/21 1144 11/10/21 1815 11/11/21 0810 11/11/21 1619 11/12/21 0932  GLUCAP 112* 122* 73 134* 91     Microbiology: Results for orders placed or performed during the hospital encounter of 09/30/21  Resp Panel by RT-PCR (Flu A&B, Covid) Nasopharyngeal Swab     Status: None   Collection Time: 09/30/21  9:34 AM   Specimen: Nasopharyngeal Swab; Nasopharyngeal(NP) swabs in vial transport medium  Result Value Ref Range Status   SARS Coronavirus 2 by RT PCR NEGATIVE NEGATIVE Final    Comment: (NOTE) SARS-CoV-2 target nucleic acids are NOT DETECTED.  The SARS-CoV-2 RNA is generally detectable in upper respiratory specimens during the acute phase of infection. The lowest concentration of SARS-CoV-2 viral copies this assay can detect is 138 copies/mL. A negative result does not preclude SARS-Cov-2 infection and should not be used as the sole basis for treatment or other patient management decisions. A negative result may occur with  improper specimen collection/handling, submission of specimen other than nasopharyngeal swab, presence of viral mutation(s) within  the areas targeted by this assay, and inadequate number of viral copies(<138 copies/mL). A negative result must be combined with clinical observations, patient history, and epidemiological information. The expected result is Negative.  Fact Sheet for Patients:  EntrepreneurPulse.com.au  Fact Sheet for Healthcare Providers:   IncredibleEmployment.be  This test is no t yet approved or cleared by the [place] FDA and  has been authorized for detection and/or diagnosis of SARS-CoV-2 by FDA under an Emergency Use Authorization (EUA). This EUA will remain  in effect (meaning this test can be used) for the duration of the COVID-19 declaration under Section 564(b)(1) of the Act, 21 U.S.C.section 360bbb-3(b)(1), unless the authorization is terminated  or revoked sooner.       Influenza A by PCR NEGATIVE NEGATIVE Final   Influenza B by PCR NEGATIVE NEGATIVE Final    Comment: (NOTE) The Xpert Xpress SARS-CoV-2/FLU/RSV plus assay is intended as an aid in the diagnosis of influenza from Nasopharyngeal swab specimens and should not be used as a sole basis for treatment. Nasal washings and aspirates are unacceptable for Xpert Xpress SARS-CoV-2/FLU/RSV testing.  Fact Sheet for Patients: EntrepreneurPulse.com.au  Fact Sheet for Healthcare Providers: IncredibleEmployment.be  This test is not yet approved or cleared by the [place] FDA and has been authorized for detection and/or diagnosis of SARS-CoV-2 by FDA under an Emergency Use Authorization (EUA). This EUA will remain in effect (meaning this test can be used) for the duration of the COVID-19 declaration under Section 564(b)(1) of the Act, 21 U.S.C. section 360bbb-3(b)(1), unless the authorization is terminated or revoked.  Performed at [facility], [address] [place], [place] [address]   SARS Coronavirus 2 by RT PCR (hospital order, performed in [facility] hospital lab) *cepheid single result test* Anterior Nasal Swab     Status: None   Collection Time: 10/23/21  4:06 PM   Specimen: Anterior Nasal Swab  Result Value Ref Range Status   SARS Coronavirus 2 by RT PCR NEGATIVE NEGATIVE Final    Comment: (NOTE) SARS-CoV-2 target nucleic acids are NOT DETECTED.  The SARS-CoV-2 RNA  is generally detectable in upper and lower respiratory specimens during the acute phase of infection. The lowest concentration of SARS-CoV-2 viral copies this assay can detect is 250 copies / mL. A negative result does not preclude SARS-CoV-2 infection and should not be used as the sole basis for treatment or other patient management decisions.  A negative result may occur with improper specimen collection / handling, submission of specimen other than nasopharyngeal swab, presence of viral mutation(s) within the areas targeted by this assay, and inadequate number of viral copies (<250 copies / mL). A negative result must be combined with clinical observations, patient history, and epidemiological information.  Fact Sheet for Patients:   https://www.patel.info/  Fact Sheet for Healthcare Providers: https://hall.com/  This test is not yet approved or  cleared by the [place] FDA and has been authorized for detection and/or diagnosis of SARS-CoV-2 by FDA under an Emergency Use Authorization (EUA).  This EUA will remain in effect (meaning this test can be used) for the duration of the COVID-19 declaration under Section 564(b)(1) of the Act, 21 U.S.C. section 360bbb-3(b)(1), unless the authorization is terminated or revoked sooner.  Performed at [facility], [address] [place], [place] [address]   Blood culture (routine x 2)     Status: None   Collection Time: 10/23/21  4:06 PM   Specimen: BLOOD  Result Value Ref Range Status  Specimen Description BLOOD BLOOD RIGHT HAND  Final   Special Requests   Final    BOTTLES DRAWN AEROBIC AND ANAEROBIC Blood Culture adequate volume   Culture   Final    NO GROWTH 5 DAYS Performed at [facility], [address] [place], [place] [address]    Report Status 10/28/2021 FINAL  Final  Culture, blood (Routine X 2) w Reflex to ID Panel     Status: None   Collection Time:  10/24/21 12:06 AM   Specimen: BLOOD  Result Value Ref Range Status   Specimen Description BLOOD LEFT HAND  Final   Special Requests   Final    BOTTLES DRAWN AEROBIC AND ANAEROBIC Blood Culture adequate volume   Culture   Final    NO GROWTH 5 DAYS Performed at [facility], [address], [place], [place] [address]    Report Status 10/29/2021 FINAL  Final  MRSA Next Gen by PCR, Nasal     Status: None   Collection Time: 10/24/21  2:24 PM   Specimen: Nasal Mucosa; Nasal Swab  Result Value Ref Range Status   MRSA by PCR Next Gen NOT DETECTED NOT DETECTED Final    Comment: (NOTE) The GeneXpert MRSA Assay (FDA approved for NASAL specimens only), is one component of a comprehensive MRSA colonization surveillance program. It is not intended to diagnose MRSA infection nor to guide or monitor treatment for MRSA infections. Test performance is not FDA approved in patients less than 36 years old. Performed at [facility], [place], [place] [address]   Respiratory (~20 pathogens) panel by PCR     Status: None   Collection Time: 10/24/21  2:24 PM   Specimen: Nasopharyngeal Swab; Respiratory  Result Value Ref Range Status   Adenovirus NOT DETECTED NOT DETECTED Final   Coronavirus 229E NOT DETECTED NOT DETECTED Final    Comment: (NOTE) The Coronavirus on the Respiratory Panel, DOES NOT test for the novel  Coronavirus (2019 nCoV)    Coronavirus HKU1 NOT DETECTED NOT DETECTED Final   Coronavirus NL63 NOT DETECTED NOT DETECTED Final   Coronavirus OC43 NOT DETECTED NOT DETECTED Final   Metapneumovirus NOT DETECTED NOT DETECTED Final   Rhinovirus / Enterovirus NOT DETECTED NOT DETECTED Final   Influenza A NOT DETECTED NOT DETECTED Final   Influenza B NOT DETECTED NOT DETECTED Final   Parainfluenza Virus 1 NOT DETECTED NOT DETECTED Final   Parainfluenza Virus 2 NOT DETECTED NOT DETECTED Final   Parainfluenza Virus 3 NOT DETECTED NOT DETECTED Final    Parainfluenza Virus 4 NOT DETECTED NOT DETECTED Final   Respiratory Syncytial Virus NOT DETECTED NOT DETECTED Final   Bordetella pertussis NOT DETECTED NOT DETECTED Final   Bordetella Parapertussis NOT DETECTED NOT DETECTED Final   Chlamydophila pneumoniae NOT DETECTED NOT DETECTED Final   Mycoplasma pneumoniae NOT DETECTED NOT DETECTED Final    Comment: Performed at [facility], [place]. [address], [place], [place] [address]    Coagulation Studies: No results for input(s): "LABPROT", "INR" in the last 72 hours.  Urinalysis: No results for input(s): "COLORURINE", "LABSPEC", "PHURINE", "GLUCOSEU", "HGBUR", "BILIRUBINUR", "KETONESUR", "PROTEINUR", "UROBILINOGEN", "NITRITE", "LEUKOCYTESUR" in the last 72 hours.  Invalid input(s): "APPERANCEUR"    Imaging: No results found.   Medications:      (feeding supplement) PROSource Plus  30 mL Oral BID BM   carvedilol  12.5 mg Oral QPM   cephALEXin  500 mg Oral Q12H   Chlorhexidine Gluconate Cloth  6 each Topical Q0600   citalopram  10 mg Oral Daily   doxycycline  100 mg Oral Q12H   epoetin alfa       epoetin (EPOGEN/PROCRIT) injection  4,000 Units Intravenous Q M,W,F-HD   furosemide  40 mg Oral Daily   heparin  5,000 Units Subcutaneous Q12H   lactulose  20 g Oral BID   lubiprostone  24 mcg Oral BID WC   multivitamin  1 tablet Oral QHS   pantoprazole  40 mg Oral BID   senna-docusate  2 tablet Oral BID   sodium chloride flush  3 mL Intravenous Q12H   acetaminophen **OR** acetaminophen, albuterol, epoetin alfa, guaiFENesin, ondansetron (ZOFRAN) IV, oxyCODONE-acetaminophen, tiZANidine, topiramate  Assessment/ Plan:  [name] is a 66 y.o.  female with past medical history of ESRD with hemodialysis TTS, diabetes mellitus, hypertension to the hospital seeking long term placement. She was recently admitted and received antibiotics for a dog bite.  She was discharged with a prescription for home antibiotics.  She states her  caregivers were unable to retrieve antibiotics.  She states that there was an argument and that she is here seeking long-term placement.  Principal Problem:   Adjustment disorder with mixed anxiety and depressed mood Active Problems:   Hypertension   Type 2 diabetes mellitus (HCC)   ESRD (end stage renal disease) ([place])   Financial difficulty   Anemia in chronic kidney disease   Sepsis ([place])   Cellulitis   Bent bone fracture of ulna   Hospital acquired PNA   Fecal impaction (HCC)   Aortic stenosis   Left arm cellulitis   [facility]/MWF/right PermCath/83.5 kg  End Stage Renal Disease on hemodialysis:  Patient receiving scheduled dialysis treatment today, seated in chair.  UF goal 1 L as tolerated.  Patient is currently well below stated dry weight.  We will establish new dry weight prior to discharge. Renal navigator and care team monitoring discharge plan, to include rehab.  Patient currently excepting at [facility] in [facility], awaiting acceptance to dialysis clinic in the area.  2. Hypertension with chronic kidney disease:   Patient remains on carvedilol and furosemide only.  Blood pressure currently 150/63 during dialysis.   BP (!) 142/74   Pulse 65   Temp 98 F (36.7 C) (Oral)   Resp 18   Ht 5\' 6"  (1.676 m)   Wt 76.3 kg   SpO2 98%   BMI 27.15 kg/m   3. Anemia of chronic kidney disease/ kidney injury/chronic disease/acute blood loss:   Lab Results  Component Value Date   HGB 9.0 (L) 11/13/2021  Hemoglobin at goal. Continue low-dose EPO with dialysis treatments.  4. Secondary Hyperparathyroidism:     Lab Results  Component Value Date   PTH 70 (H) 05/12/2020   CALCIUM 8.2 (L) 11/13/2021   PHOS 3.6 11/13/2021   Calcium and phosphorus remain within acceptable range.  Phosphorus has improved.  We will determine acceptable time to restart calcium acetate with meals.  5.  Severe constipation Managed with stool softeners. Encouraged  patient to utilize non-narcotic medications to minimize reoccurrence.   Patient prescribed daily stool softeners and laxatives as needed.  Patient has decreased frequency of narcotic medications also.   LOS: [facility] 7/3/202311:44 AM

## 2021-11-13 NOTE — Progress Notes (Signed)
  Progress Note   Patient: Katie [name] QIO:[id number] DOB: 08/25/1955 DOA: 09/30/2021     21 DOS: the patient was seen and examined on 11/13/2021   Brief hospital course: Katie [name] is a 66 y.o. female with medical history significant of ESRD, DM II, homelessness. Had been admitted 05/13-05/17 for sepsis d/t cellulits from animal bite to arm, discharged on po abx and was unable to fill the Rx, was kicked out of where she was living, no way to get HD.  Patient has been in the ED since arriving the hospital.  She was admitted to the hospital again on 10/23/2021 as patient developed cough short of breath and fever.  Chest x-ray showed pneumonia.  Patient was diagnosed with sepsis and pneumonia, was started on cefepime and vancomycin. Had MRI performed on 6/14 did not show any osteomyelitis. Patient is currently homeless, has a severe weakness.  No discharge option at this time. 6/15.  Patient has no nausea, KUB showed fecal impaction with severe dilated colon, patient was started on senna as well as scheduled lactulose.  CT chest also showed dense right lower lobe consolidation consistent with aspiration pneumonia.  Shoulder x-ray does not show any osteomyelitis versus septic joint. Patient fecal impaction resolved after Nulytely on 6/17. Patient condition has stabilized, pending nursing home placement. 6/29.  Patient had a significant left arm swelling, duplex ultrasound did not show DVT.  Become red today, started antibiotics with doxycycline and Keflex for 5 days  Assessment and Plan: Left arm cellulitis Left arm is better, but still red.  We will extend antibiotic treatment to 7 to 10 days.     Resolved Sepsis. Treated Healthcare associate pneumonia. Treated Recent dog bites with cellulitis. Resolved.   Improved intractable nausea and vomiting Resolved fecal impaction. Chronic constipation Improved.   End stage renal disease on dialysis, now MWF. Anemia of chronic kidney  disease. Resolved hypokalemia. Dialysis per nephrology.  Type 2 diabetes with occasional hypoglycemia Asthma stable  Essential hypertension. Continue current treatment   Mild aortic stenosis. Follow-up with PCP as outpatient.   Possible depression Start lower dose Celexa.      Subjective:  Patient doing well today, left arm swelling is better.  No shortness of breath.  Physical Exam: Vitals:   11/13/21 1015 11/13/21 1030 11/13/21 1040 11/13/21 1045  BP: (!) 163/58 (!) 164/61  (!) 144/63  Pulse: 64 64 65 64  Resp: 18 (!) 22 18 18   Temp:      TempSrc:      SpO2:      Weight:      Height:       General exam: Appears calm and comfortable  Respiratory system: Clear to auscultation. Respiratory effort normal. Cardiovascular system: S1 & S2 heard, RRR. No JVD, murmurs, rubs, gallops or clicks. No pedal edema. Gastrointestinal system: Abdomen is nondistended, soft and nontender. No organomegaly or masses felt. Normal bowel sounds heard. Central nervous system: Alert and oriented. No focal neurological deficits. Extremities: left arm swelling better, still red Skin: No rashes, lesions or ulcers Psychiatry: Judgement and insight appear normal. Mood & affect appropriate.   Data Reviewed:  No new results  Family Communication:   Disposition: Status is: Inpatient Remains inpatient appropriate because: Unsafe discharge  Planned Discharge Destination: Home with Home Health    Time spent: 26 minutes  Author: Sharen Hones, MD 11/13/2021 11:02 AM  For on call review [email or web address].

## 2021-11-14 DIAGNOSIS — F4323 Adjustment disorder with mixed anxiety and depressed mood: Secondary | ICD-10-CM | POA: Diagnosis not present

## 2021-11-14 DIAGNOSIS — L03114 Cellulitis of left upper limb: Secondary | ICD-10-CM | POA: Diagnosis not present

## 2021-11-14 DIAGNOSIS — N186 End stage renal disease: Secondary | ICD-10-CM | POA: Diagnosis not present

## 2021-11-14 LAB — GLUCOSE, CAPILLARY: Glucose-Capillary: 96 mg/dL (ref 70–99)

## 2021-11-14 MED ORDER — LOPERAMIDE HCL 2 MG PO CAPS
2.0000 mg | ORAL_CAPSULE | ORAL | Status: DC | PRN
Start: 2021-11-14 — End: 2021-11-27
  Administered 2021-11-15: 2 mg via ORAL
  Filled 2021-11-14: qty 1

## 2021-11-14 MED ORDER — SENNOSIDES-DOCUSATE SODIUM 8.6-50 MG PO TABS
2.0000 | ORAL_TABLET | Freq: Two times a day (BID) | ORAL | Status: DC | PRN
  Administered 2021-11-22 (×2): 2 via ORAL
  Filled 2021-11-14 (×2): qty 2

## 2021-11-14 NOTE — Progress Notes (Signed)
Pt refused her BS to be checked. MD notified

## 2021-11-14 NOTE — Progress Notes (Signed)
11/14/2021 at 2320:  Charge RN alerted this RN that pt was requesting that nasal cannula be placed HS. This RN reported to pt's bedside to place nasal cannula. Nasal cannula aligned and in place. Pt requested 5 packs of saltine crackers. This RN replied, "You have a bag full of saltine crackers to your right on your bedside table." The pt stated, "Oh ok." This RN exited the pt's room. The pt calls to the front desk about 5 minutes later stating that she "spilled liquid all over herself and her table." This RN and assigned NT report to pt bedside. Pt states, "that nurse has not given me an ounce of help tonight." This RN states to pt, "Ma'am I have given you the broth that you requested, adjusted your pillows/blankets to your liking, and placed your oxygen as requested. Is there anything else that you need?" Pt states, "All I needed you to do was hand me my crackers and you made me spill liquid all over myself." NT assesses spill and states that a small spill was on the pt's bedside table and a small wet area on the pt's blanket. NT cleans pt's table and this RN provides new blanket. The pt states, "I am going to report you in the morning." This RN states, "Ok. Is there anything else that I can do for you?" Pt is then apologetic and stating that her "father would not be happy with with her" and that she "is not going to report anything." Pt also states, "I am grumpy." This RN and NT provide emotional support and exit the room. This RN will continue to educate pt and promote independence within pt's limits. Pt is resting and call bell within reach.

## 2021-11-14 NOTE — Progress Notes (Signed)
Progress Note   Patient: Katie [name] GTX:[id number] DOB: 1955/11/15 DOA: 09/30/2021     22 DOS: the patient was seen and examined on 11/14/2021   Brief hospital course: Katie [name] is a 66 y.o. female with medical history significant of ESRD, DM II, homelessness. Had been admitted 05/13-05/17 for sepsis d/t cellulits from animal bite to arm, discharged on po abx and was unable to fill the Rx, was kicked out of where she was living, no way to get HD.  Patient has been in the ED since arriving the hospital.  She was admitted to the hospital again on 10/23/2021 as patient developed cough short of breath and fever.  Chest x-ray showed pneumonia.  Patient was diagnosed with sepsis and pneumonia, was started on cefepime and vancomycin. Had MRI performed on 6/14 did not show any osteomyelitis. Patient is currently homeless, has a severe weakness.  No discharge option at this time. 6/15.  Patient has no nausea, KUB showed fecal impaction with severe dilated colon, patient was started on senna as well as scheduled lactulose.  CT chest also showed dense right lower lobe consolidation consistent with aspiration pneumonia.  Shoulder x-ray does not show any osteomyelitis versus septic joint. Patient fecal impaction resolved after Nulytely on 6/17. Patient condition has stabilized, pending nursing home placement. 6/29.  Patient had a significant left arm swelling, duplex ultrasound did not show DVT.  Become red today, started antibiotics with doxycycline and Keflex for 7-10 days  Assessment and Plan: Left arm cellulitis This happened while in the hospital.  This is day 5 of antibiotics with doxycycline and Keflex.  Left arm is better, still has some redness.  I will extend antibiotics to 7 to 10 days.     Resolved Sepsis. Treated Healthcare associate pneumonia. Treated Recent dog bites with cellulitis. Resolved.   Improved intractable nausea and vomiting Resolved fecal impaction. Chronic  constipation Improved.   End stage renal disease on dialysis, now MWF. Anemia of chronic kidney disease. Resolved hypokalemia. Dialysis per nephrology.  Type 2 diabetes with occasional hypoglycemia Asthma stable  Essential hypertension. Continue current treatment   Mild aortic stenosis. Follow-up with PCP as outpatient.   Possible depression Start lower dose Celexa.        Subjective:  Patient doing better, left arm swelling much improved.  No nausea vomiting.  Physical Exam: Vitals:   11/13/21 1722 11/13/21 2139 11/14/21 0608 11/14/21 0746  BP: 119/63 (!) 115/59 (!) 153/65 (!) 122/58  Pulse: 74 70 73 74  Resp: 18 18 19 15   Temp: 98 F (36.7 C) 98.6 F (37 C) 97.7 F (36.5 C) 98.6 F (37 C)  TempSrc: Oral     SpO2: 94% 94% 95% 92%  Weight:    73.8 kg  Height:       General exam: Appears calm and comfortable  Respiratory system: Clear to auscultation. Respiratory effort normal. Cardiovascular system: S1 & S2 heard, RRR. No JVD, murmurs, rubs, gallops or clicks. No pedal edema. Gastrointestinal system: Abdomen is nondistended, soft and nontender. No organomegaly or masses felt. Normal bowel sounds heard. Central nervous system: Alert and oriented. No focal neurological deficits. Extremities: Left arm swelling much better, still has some redness. Skin: No rashes, lesions or ulcers Psychiatry: Judgement and insight appear normal. Mood & affect appropriate.   Data Reviewed:  There are no new results to review at this time.  Family Communication: No family  Disposition: Status is: Inpatient Remains inpatient appropriate because: Unsafe discharge.  Planned Discharge Destination: Skilled  nursing facility    Time spent: 25 minutes  Author: Sharen Hones, MD 11/14/2021 2:25 PM  For on call review [email or web address].

## 2021-11-15 ENCOUNTER — Encounter: Payer: Self-pay | Admitting: Osteopathic Medicine

## 2021-11-15 DIAGNOSIS — L03114 Cellulitis of left upper limb: Secondary | ICD-10-CM | POA: Diagnosis not present

## 2021-11-15 DIAGNOSIS — F4323 Adjustment disorder with mixed anxiety and depressed mood: Secondary | ICD-10-CM | POA: Diagnosis not present

## 2021-11-15 DIAGNOSIS — D631 Anemia in chronic kidney disease: Secondary | ICD-10-CM | POA: Diagnosis not present

## 2021-11-15 DIAGNOSIS — N186 End stage renal disease: Secondary | ICD-10-CM | POA: Diagnosis not present

## 2021-11-15 LAB — CBC
HCT: 31.1 % — ABNORMAL LOW (ref 36.0–46.0)
Hemoglobin: 9.3 g/dL — ABNORMAL LOW (ref 12.0–15.0)
MCH: 29.2 pg (ref 26.0–34.0)
MCHC: 29.9 g/dL — ABNORMAL LOW (ref 30.0–36.0)
MCV: 97.8 fL (ref 80.0–100.0)
Platelets: 282 10*3/uL (ref 150–400)
RBC: 3.18 MIL/uL — ABNORMAL LOW (ref 3.87–5.11)
RDW: 15.9 % — ABNORMAL HIGH (ref 11.5–15.5)
WBC: 6.3 10*3/uL (ref 4.0–10.5)
nRBC: 0 % (ref 0.0–0.2)

## 2021-11-15 LAB — RENAL FUNCTION PANEL
Albumin: 2.7 g/dL — ABNORMAL LOW (ref 3.5–5.0)
Anion gap: 10 (ref 5–15)
BUN: 28 mg/dL — ABNORMAL HIGH (ref 8–23)
CO2: 27 mmol/L (ref 22–32)
Calcium: 8.6 mg/dL — ABNORMAL LOW (ref 8.9–10.3)
Chloride: 103 mmol/L (ref 98–111)
Creatinine, Ser: 5.86 mg/dL — ABNORMAL HIGH (ref 0.44–1.00)
GFR, Estimated: 8 mL/min — ABNORMAL LOW (ref >60)
Glucose, Bld: 128 mg/dL — ABNORMAL HIGH (ref 70–99)
Phosphorus: 3.8 mg/dL (ref 2.5–4.6)
Potassium: 3.7 mmol/L (ref 3.5–5.1)
Sodium: 140 mmol/L (ref 135–145)

## 2021-11-15 LAB — GLUCOSE, CAPILLARY: Glucose-Capillary: 88 mg/dL (ref 70–99)

## 2021-11-15 MED ORDER — EPOETIN ALFA 4000 UNIT/ML IJ SOLN
INTRAMUSCULAR | Status: AC
  Filled 2021-11-15: qty 1

## 2021-11-15 MED ORDER — LIDOCAINE HCL (PF) 1 % IJ SOLN
5.0000 mL | INTRAMUSCULAR | Status: DC | PRN
Start: 2021-11-15 — End: 2021-11-15

## 2021-11-15 MED ORDER — RISAQUAD PO CAPS
2.0000 | ORAL_CAPSULE | Freq: Three times a day (TID) | ORAL | Status: AC
  Administered 2021-11-15 – 2021-11-19 (×12): 2 via ORAL
  Filled 2021-11-15 (×12): qty 2

## 2021-11-15 MED ORDER — HEPARIN SODIUM (PORCINE) 1000 UNIT/ML DIALYSIS: 1000.0000 [IU] | INTRAMUSCULAR | Status: DC | PRN

## 2021-11-15 MED ORDER — LIDOCAINE-PRILOCAINE 2.5-2.5 % EX CREA: 1.0000 | TOPICAL_CREAM | CUTANEOUS | Status: DC | PRN

## 2021-11-15 MED ORDER — ALTEPLASE 2 MG IJ SOLR: 2.0000 mg | Freq: Once | INTRAMUSCULAR | Status: DC | PRN

## 2021-11-15 MED ORDER — PENTAFLUOROPROP-TETRAFLUOROETH EX AERO: 1.0000 | INHALATION_SPRAY | CUTANEOUS | Status: DC | PRN

## 2021-11-15 NOTE — Progress Notes (Signed)
Nutrition Follow-up  DOCUMENTATION CODES:   Not applicable  INTERVENTION:   -Continue renal MVI daily -Continue 30 ml Prosource Plus BID, each supplement provides 100 kcals and 15 grams protein -RD will sign off secondary to medical stability; if further nutrition issues arise, please re-consult RD  NUTRITION DIAGNOSIS:   Increased nutrient needs related to catabolic illness (ESRD on HD) as evidenced by estimated needs.  Ongoing  GOAL:   Patient will meet greater than or equal to 90% of their needs  Progressing   MONITOR:   PO intake, Supplement acceptance, Labs, Weight trends, Skin, I & O's  REASON FOR ASSESSMENT:   Malnutrition Screening Tool    ASSESSMENT:   66 y/o female with h/o ESRD on HD, DM, HTN, homelessness and recent dog bite who is admitted with PNA, sepsis, cellulitis of left hand with septic arthropathy and ulnar fracture.  6/16- tap water enema provided secondary to constipation and fecal impaction 6/21- s/p MBSS- advanced to regular consistency diet  Reviewed I/O's: +240 ml x 24 hours and -3.3 L since 11/01/21  Pt our of room, receiving HD at time of visit.   Pt continues with good oral intake. Noted meal completions 50-100%.   Wt has been stable since admission.   Per TOC notes, pt is medically stable for discharge and awaiting SNF bed and HD placement. RD will sign-off secondary to medical stability.   Medications reviewed and include lasix and lactulose.   Labs reviewed: CBGS: 88-96 (inpatient orders for glycemic control are none).    Diet Order:   Diet Order             Diet regular Room service appropriate? Yes with Assist; Fluid consistency: Thin  Diet effective now                   EDUCATION NEEDS:   Not appropriate for education at this time  Skin:  Skin Assessment: Skin Integrity Issues: Skin Integrity Issues:: Other (Comment) Other: laceration on lt finger 2/2 dog bite  Last BM:  11/15/21  Height:   Ht Readings  from Last 1 Encounters:  10/24/21 5\' 6"  (1.676 m)    Weight:   Wt Readings from Last 1 Encounters:  11/15/21 75.1 kg    Ideal Body Weight:  59 kg  BMI:  Body mass index is 26.72 kg/m.  Estimated Nutritional Needs:   Kcal:  2050-2250  Protein:  105-120 grams  Fluid:  1000 ml + UOP    Loistine Chance, RD, LDN, [place] Registered Dietitian II Certified Diabetes Care and Education Specialist Please refer to [facility] for RD and/or RD on-call/weekend/after hours pager

## 2021-11-15 NOTE — Progress Notes (Signed)
PT Cancellation Note  Patient Details Name: Katie [name] MRN: [id number] DOB: 09-19-1955   Cancelled Treatment:    Reason Eval/Treat Not Completed: Other (comment) On arrival to pt room she had sheet over head, but did answer when PT introduced himself.  She reports she is very much wanting to work with PT but she is feeling terrible since having vomiting and diarrhea during dialysis earlier today.  She is expecting to feel much better tomorrow and asks PT to please come back tomorrow because activity/walking "makes me feel better when I can do it."  Kreg Shropshire, DPT 11/15/2021, 4:22 PM

## 2021-11-15 NOTE — Progress Notes (Signed)
Hemodialysis Post Treatment Note:  Tx date: 11/15/21 Tx time: 3hours Access: Right CVC UF Removed: 1.5liters  Note:  Tolerated tx, no adverse effects noted.Epogen given during hemodialysis. Patient had 1 episode of diarrhea. CVC wrapped with gauze.

## 2021-11-15 NOTE — TOC Progression Note (Addendum)
Transition of Care [facility]) - Progression Note    Patient Details  Name: Katie [name] MRN: [id number] Date of Birth: 02/11/1956  Transition of Care [facility] CM/SW Contact  Eileen Stanford, LCSW Phone Number: 11/15/2021, 3:12 PM  Clinical Narrative:   Estill Bamberg, HD coordinator is waiting to hear back from [facility] [place] at HD center to see if pt can do MWF after 10:00 so pt can go to [facility]. Estill Bamberg has not heard back yet. The barrier is insurance, placement, and HD seat. CSW notified Maudie Mercury with [facility] that we are still working on that.   Estill Bamberg also will email CSW a list of SNF in [place] that she could possibly get a HD seat for in the [facility] center.     Expected Discharge Plan: Skilled Nursing Facility Barriers to Discharge: SNF Pending bed offer  Expected Discharge Plan and Services Expected Discharge Plan: [place]   Discharge Planning Services: CM Consult Post Acute Care Choice: [place] Living arrangements for the past 2 months: Single Family Home                 DME Arranged: N/A DME Agency: NA       HH Arranged: NA HH Agency: NA         Social Determinants of Health (SDOH) Interventions    Readmission Risk Interventions    09/27/2021    1:44 PM 09/26/2021   12:46 PM 03/08/2021    2:49 PM  Readmission Risk Prevention Plan  Transportation Screening  Complete Complete  Palliative Care Screening Not Applicable    Medication Review (RN Care Manager)  Complete Complete  HRI or Home Care Consult   Complete  SW Recovery Care/Counseling Consult  Complete   Palliative Care Screening  Not Applicable   [place]  Not Applicable Complete

## 2021-11-15 NOTE — Progress Notes (Addendum)
Progress Note    Katie [name]  KNL:[id number] DOB: 02/14/1956  DOA: 09/30/2021 PCP: Pcp, No      Brief Narrative:    Medical records reviewed and are as summarized below:  Katie [name] is a 66 y.o. female with medical history significant of ESRD, DM II, homelessness. Had been admitted 05/13-05/17 for sepsis d/t cellulits from animal bite to arm, discharged on po abx and was unable to fill the Rx, was kicked out of where she was living, no way to get HD.  Patient has been in the ED since arriving the hospital.  She was admitted to the hospital again on 10/23/2021 as patient developed cough short of breath and fever.  Chest x-ray showed pneumonia.  Patient was diagnosed with sepsis and pneumonia, was started on cefepime and vancomycin. Had MRI performed on 6/14 did not show any osteomyelitis. Patient is currently homeless, has a severe weakness.  No discharge option at this time. 6/15.  Patient has no nausea, KUB showed fecal impaction with severe dilated colon, patient was started on senna as well as scheduled lactulose.  CT chest also showed dense right lower lobe consolidation consistent with aspiration pneumonia.  CT of the left shoulder did not show any osteomyelitis or septic arthritis. Fecal impaction resolved after [place] on 6/17. Patient condition has stabilized, pending nursing home placement. 6/29.  Patient had a significant left upper extremity swelling, duplex ultrasound did not show DVT.  She was started on doxycycline and Keflex for cellulitis of the left upper extremity.      Assessment/Plan:   Principal Problem:   Adjustment disorder with mixed anxiety and depressed mood Active Problems:   Cellulitis   Hypertension   Type 2 diabetes mellitus (HCC)   ESRD (end stage renal disease) (HCC)   Financial difficulty   Anemia in chronic kidney disease   Sepsis ([place])   Bent bone fracture of ulna   Hospital acquired PNA   Fecal impaction (HCC)   Aortic stenosis    Left arm cellulitis   Nutrition Problem: Increased nutrient needs Etiology: catabolic illness (ESRD on HD)  Signs/Symptoms: estimated needs   Body mass index is 26.72 kg/m.  Left upper extremity cellulitis: Continue doxycycline and Keflex through 11/18/2021.  Add probiotics.  Vomiting and diarrhea: Discontinue Amitiza and lactulose.  Use Imodium as needed for diarrhea and antiemetics as needed for vomiting.  Check BMP/electrolytes tomorrow  ESRD on HD: She is on MWF schedule.  Follow-up with nephrologist for hemodialysis.  Recent sepsis from pneumonia: Completed antibiotics for that  Constipation, fecal impaction, hypokalemia: Resolved  Other comorbidities include mild aortic stenosis, probable depression, type II DM with occasional hypoglycemia, anemia of chronic kidney disease    Diet Order             Diet regular Room service appropriate? Yes with Assist; Fluid consistency: Thin  Diet effective now                            Consultants: Psychiatrist, nephrologist, gastroenterologist, orthopedic surgeon, infectious disease  Procedures: None    Medications:    (feeding supplement) PROSource Plus  30 mL Oral BID BM   acidophilus  2 capsule Oral TID   carvedilol  12.5 mg Oral QPM   cephALEXin  500 mg Oral Q12H   Chlorhexidine Gluconate Cloth  6 each Topical Q0600   citalopram  10 mg Oral Daily   doxycycline  100 mg Oral Q12H  epoetin alfa       epoetin (EPOGEN/PROCRIT) injection  4,000 Units Intravenous Q M,W,F-HD   furosemide  40 mg Oral Daily   heparin  5,000 Units Subcutaneous Q12H   multivitamin  1 tablet Oral QHS   pantoprazole  40 mg Oral BID   sodium chloride flush  3 mL Intravenous Q12H   Continuous Infusions:   Anti-infectives (From admission, onward)    Start     Dose/Rate Route Frequency Ordered Stop   11/09/21 1215  doxycycline (VIBRA-TABS) tablet 100 mg        100 mg Oral Every 12 hours 11/09/21 1122     11/09/21 1215   cephALEXin (KEFLEX) capsule 500 mg        500 mg Oral Every 12 hours 11/09/21 1123     10/24/21 1200  vancomycin (VANCOCIN) IVPB 1000 mg/200 mL premix  Status:  Discontinued        1,000 mg 200 mL/hr over 60 Minutes Intravenous Every T-Th-Sa (Hemodialysis) 10/23/21 2017 10/27/21 1303   10/24/21 1200  ceFEPIme (MAXIPIME) 2 g in sodium chloride 0.9 % 100 mL IVPB        2 g 200 mL/hr over 30 Minutes Intravenous Every T-Th-Sa (Hemodialysis) 10/23/21 2017 10/29/21 2359   10/23/21 1615  vancomycin (VANCOREADY) IVPB 1750 mg/350 mL        1,750 mg 175 mL/hr over 120 Minutes Intravenous  Once 10/23/21 1614 10/23/21 1906   10/23/21 1530  vancomycin (VANCOCIN) IVPB 1000 mg/200 mL premix  Status:  Discontinued        1,000 mg 200 mL/hr over 60 Minutes Intravenous  Once 10/23/21 1526 10/23/21 1614   10/23/21 1530  ceFEPIme (MAXIPIME) 2 g in sodium chloride 0.9 % 100 mL IVPB        2 g 200 mL/hr over 30 Minutes Intravenous  Once 10/23/21 1526 10/23/21 1703   10/02/21 2200  amoxicillin-clavulanate (AUGMENTIN) 500-125 MG per tablet 500 mg  Status:  Discontinued        1 tablet Oral Every 12 hours 10/02/21 1454 10/13/21 1151   09/30/21 2200  amoxicillin-clavulanate (AUGMENTIN) 875-125 MG per tablet 1 tablet  Status:  Discontinued        1 tablet Oral Every 12 hours 09/30/21 1803 10/02/21 1454   09/30/21 1545  Ampicillin-Sulbactam (UNASYN) 3 g in sodium chloride 0.9 % 100 mL IVPB        3 g 200 mL/hr over 30 Minutes Intravenous  Once 09/30/21 1530 09/30/21 1629              Family Communication/Anticipated D/C date and plan/Code Status   DVT prophylaxis: heparin injection 5,000 Units Start: 10/23/21 2330     Code Status: Full Code  Family Communication: None Disposition Plan: Awaiting placement   Status is: Inpatient Remains inpatient appropriate because: Awaiting placement   Subjective:   Interval events noted.  She complains of vomiting and diarrhea.  She said she had large watery  stools during dialysis.  She was on Amitiza and lactulose.  Lactulose was not given this morning.  She still has pain and swelling in the left forearm  Objective:    Vitals:   11/15/21 1215 11/15/21 1220 11/15/21 1229 11/15/21 1230  BP: 109/60 (!) 94/57 (!) 152/57 (!) 152/57  Pulse: 72 70 76 77  Resp: (!) 22 (!) 21 (!) 21 (!) 21  Temp:   97.8 F (36.6 C)   TempSrc:   Oral   SpO2: 100% 100% 99% 100%  Weight:  Height:       No data found.   Intake/Output Summary (Last 24 hours) at 11/15/2021 1438 Last data filed at 11/15/2021 1220 Gross per 24 hour  Intake --  Output 1500 ml  Net -1500 ml   Filed Weights   11/14/21 0746 11/15/21 0500 11/15/21 0907  Weight: 73.8 kg 78.9 kg 75.1 kg    Exam:  GEN: NAD SKIN: No rash EYES: EOMI ENT: MMM CV: RRR PULM: CTA B ABD: soft, obese, NT, +BS CNS: AAO x 3, non focal EXT: Swelling, tenderness and erythema of the left forearm    Pressure Injury Buttocks (Active)     Location: Buttocks  Location Orientation:   Staging:   Wound Description (Comments):   Present on Admission:      Data Reviewed:   I have personally reviewed following labs and imaging studies:  Labs: Labs show the following:   Basic Metabolic Panel: Recent Labs  Lab 11/10/21 1243 11/13/21 0945 11/15/21 0940  NA 137 141 140  K 4.1 3.7 3.7  CL 101 106 103  CO2 27 28 27   GLUCOSE 124* 113* 128*  BUN 25* 34* 28*  CREATININE 4.78* 5.67* 5.86*  CALCIUM 8.9 8.2* 8.6*  PHOS 3.0 3.6 3.8   GFR Estimated Creatinine Clearance: 10.2 mL/min (A) (by C-G formula based on SCr of 5.86 mg/dL (H)). Liver Function Tests: Recent Labs  Lab 11/10/21 1243 11/13/21 0945 11/15/21 0940  ALBUMIN 2.9* 2.5* 2.7*   No results for input(s): "LIPASE", "AMYLASE" in the last 168 hours. No results for input(s): "AMMONIA" in the last 168 hours. Coagulation profile No results for input(s): "INR", "PROTIME" in the last 168 hours.  CBC: Recent Labs  Lab 11/10/21 1252  11/13/21 0945 11/15/21 0940  WBC 8.3 5.9 6.3  HGB 10.0* 9.0* 9.3*  HCT 33.0* 30.0* 31.1*  MCV 96.2 98.0 97.8  PLT 338 266 282   Cardiac Enzymes: No results for input(s): "CKTOTAL", "CKMB", "CKMBINDEX", "TROPONINI" in the last 168 hours. BNP (last 3 results) No results for input(s): "PROBNP" in the last 8760 hours. CBG: Recent Labs  Lab 11/11/21 0810 11/11/21 1619 11/12/21 0932 11/14/21 0749 11/15/21 0740  GLUCAP 73 134* 91 96 88   D-Dimer: No results for input(s): "DDIMER" in the last 72 hours. Hgb A1c: No results for input(s): "HGBA1C" in the last 72 hours. Lipid Profile: No results for input(s): "CHOL", "HDL", "LDLCALC", "TRIG", "CHOLHDL", "LDLDIRECT" in the last 72 hours. Thyroid function studies: No results for input(s): "TSH", "T4TOTAL", "T3FREE", "THYROIDAB" in the last 72 hours.  Invalid input(s): "FREET3" Anemia work up: No results for input(s): "VITAMINB12", "FOLATE", "FERRITIN", "TIBC", "IRON", "RETICCTPCT" in the last 72 hours. Sepsis Labs: Recent Labs  Lab 11/10/21 1252 11/13/21 0945 11/15/21 0940  WBC 8.3 5.9 6.3    Microbiology No results found for this or any previous visit (from the past 240 hour(s)).  Procedures and diagnostic studies:  No results found.             LOS: 23 days   [place] Copywriter, advertising on [email or web address]. If 7PM-7AM, please contact night-coverage at [email or web address]     11/15/2021, 2:38 PM

## 2021-11-15 NOTE — Progress Notes (Signed)
[facility]  Dialysis Note   Subjective:   Patient seen and evaluated during dialysis   HEMODIALYSIS FLOWSHEET:  Blood Flow Rate (mL/min): 400 mL/min Arterial Pressure (mmHg): -200 mmHg Venous Pressure (mmHg): 120 mmHg Transmembrane Pressure (mmHg): 50 mmHg Ultrafiltration Rate (mL/min): 670 mL/min Dialysate Flow Rate (mL/min): 500 ml/min Conductivity: 13.9 Conductivity: 13.9 Dialysis Fluid Bolus: Normal Saline Bolus Amount (mL): 250 mL  Seated in chair, tolerating treatment well.   Objective:  Vital signs in last 24 hours:  Temp:  [98.1 F (36.7 C)-98.9 F (37.2 C)] 98.1 F (36.7 C) (07/05 0907) Pulse Rate:  [66-77] 74 (07/05 1200) Resp:  [10-20] 17 (07/05 1200) BP: (111-154)/(54-80) 126/62 (07/05 1200) SpO2:  [94 %-100 %] 100 % (07/05 1200) Weight:  [75.1 kg-78.9 kg] 75.1 kg (07/05 0907)  Weight change: -2.5 kg Filed Weights   11/14/21 0746 11/15/21 0500 11/15/21 0907  Weight: 73.8 kg 78.9 kg 75.1 kg    Intake/Output: I/O last 3 completed shifts: In: 240 [P.O.:240] Out: -    Intake/Output this shift:  No intake/output data recorded.  Physical Exam: General: NAD  Head: Normocephalic, atraumatic. Moist oral mucosal membranes  Eyes: Anicteric  Lungs:  Clear to auscultation, normal effort  Heart: Regular rate and rhythm  Abdomen:  Soft, nontender  Extremities: No peripheral edema.  Left upper extremity edema  Neurologic: Nonfocal, moving all four extremities  Skin: No lesions  Access: Right PermCath    Basic Metabolic Panel: Recent Labs  Lab 11/10/21 1243 11/13/21 0945 11/15/21 0940  NA 137 141 140  K 4.1 3.7 3.7  CL 101 106 103  CO2 27 28 27   GLUCOSE 124* 113* 128*  BUN 25* 34* 28*  CREATININE 4.78* 5.67* 5.86*  CALCIUM 8.9 8.2* 8.6*  PHOS 3.0 3.6 3.8     Liver Function Tests: Recent Labs  Lab 11/10/21 1243 11/13/21 0945 11/15/21 0940  ALBUMIN 2.9* 2.5* 2.7*    No results for input(s): "LIPASE", "AMYLASE" in the last  168 hours. No results for input(s): "AMMONIA" in the last 168 hours.  CBC: Recent Labs  Lab 11/10/21 1252 11/13/21 0945 11/15/21 0940  WBC 8.3 5.9 6.3  HGB 10.0* 9.0* 9.3*  HCT 33.0* 30.0* 31.1*  MCV 96.2 98.0 97.8  PLT 338 266 282     Cardiac Enzymes: No results for input(s): "CKTOTAL", "CKMB", "CKMBINDEX", "TROPONINI" in the last 168 hours.  BNP: Invalid input(s): "POCBNP"  CBG: Recent Labs  Lab 11/11/21 0810 11/11/21 1619 11/12/21 0932 11/14/21 0749 11/15/21 0740  GLUCAP 73 134* 91 96 88     Microbiology: Results for orders placed or performed during the hospital encounter of 09/30/21  Resp Panel by RT-PCR (Flu A&B, Covid) Nasopharyngeal Swab     Status: None   Collection Time: 09/30/21  9:34 AM   Specimen: Nasopharyngeal Swab; Nasopharyngeal(NP) swabs in vial transport medium  Result Value Ref Range Status   SARS Coronavirus 2 by RT PCR NEGATIVE NEGATIVE Final    Comment: (NOTE) SARS-CoV-2 target nucleic acids are NOT DETECTED.  The SARS-CoV-2 RNA is generally detectable in upper respiratory specimens during the acute phase of infection. The lowest concentration of SARS-CoV-2 viral copies this assay can detect is 138 copies/mL. A negative result does not preclude SARS-Cov-2 infection and should not be used as the sole basis for treatment or other patient management decisions. A negative result may occur with  improper specimen collection/handling, submission of specimen other than nasopharyngeal swab, presence of viral mutation(s) within the areas targeted by this  assay, and inadequate number of viral copies(<138 copies/mL). A negative result must be combined with clinical observations, patient history, and epidemiological information. The expected result is Negative.  Fact Sheet for Patients:  EntrepreneurPulse.com.au  Fact Sheet for Healthcare Providers:  IncredibleEmployment.be  This test is no t yet approved or  cleared by the [place] FDA and  has been authorized for detection and/or diagnosis of SARS-CoV-2 by FDA under an Emergency Use Authorization (EUA). This EUA will remain  in effect (meaning this test can be used) for the duration of the COVID-19 declaration under Section 564(b)(1) of the Act, 21 U.S.C.section 360bbb-3(b)(1), unless the authorization is terminated  or revoked sooner.       Influenza A by PCR NEGATIVE NEGATIVE Final   Influenza B by PCR NEGATIVE NEGATIVE Final    Comment: (NOTE) The Xpert Xpress SARS-CoV-2/FLU/RSV plus assay is intended as an aid in the diagnosis of influenza from Nasopharyngeal swab specimens and should not be used as a sole basis for treatment. Nasal washings and aspirates are unacceptable for Xpert Xpress SARS-CoV-2/FLU/RSV testing.  Fact Sheet for Patients: EntrepreneurPulse.com.au  Fact Sheet for Healthcare Providers: IncredibleEmployment.be  This test is not yet approved or cleared by the [place] FDA and has been authorized for detection and/or diagnosis of SARS-CoV-2 by FDA under an Emergency Use Authorization (EUA). This EUA will remain in effect (meaning this test can be used) for the duration of the COVID-19 declaration under Section 564(b)(1) of the Act, 21 U.S.C. section 360bbb-3(b)(1), unless the authorization is terminated or revoked.  Performed at [facility], [address] [place], [place] [address]   SARS Coronavirus 2 by RT PCR (hospital order, performed in [facility] hospital lab) *cepheid single result test* Anterior Nasal Swab     Status: None   Collection Time: 10/23/21  4:06 PM   Specimen: Anterior Nasal Swab  Result Value Ref Range Status   SARS Coronavirus 2 by RT PCR NEGATIVE NEGATIVE Final    Comment: (NOTE) SARS-CoV-2 target nucleic acids are NOT DETECTED.  The SARS-CoV-2 RNA is generally detectable in upper and lower respiratory specimens during the  acute phase of infection. The lowest concentration of SARS-CoV-2 viral copies this assay can detect is 250 copies / mL. A negative result does not preclude SARS-CoV-2 infection and should not be used as the sole basis for treatment or other patient management decisions.  A negative result may occur with improper specimen collection / handling, submission of specimen other than nasopharyngeal swab, presence of viral mutation(s) within the areas targeted by this assay, and inadequate number of viral copies (<250 copies / mL). A negative result must be combined with clinical observations, patient history, and epidemiological information.  Fact Sheet for Patients:   https://www.patel.info/  Fact Sheet for Healthcare Providers: https://hall.com/  This test is not yet approved or  cleared by the [place] FDA and has been authorized for detection and/or diagnosis of SARS-CoV-2 by FDA under an Emergency Use Authorization (EUA).  This EUA will remain in effect (meaning this test can be used) for the duration of the COVID-19 declaration under Section 564(b)(1) of the Act, 21 U.S.C. section 360bbb-3(b)(1), unless the authorization is terminated or revoked sooner.  Performed at [facility], [address] [place], [place] [address]   Blood culture (routine x 2)     Status: None   Collection Time: 10/23/21  4:06 PM   Specimen: BLOOD  Result Value Ref Range Status   Specimen Description BLOOD BLOOD  RIGHT HAND  Final   Special Requests   Final    BOTTLES DRAWN AEROBIC AND ANAEROBIC Blood Culture adequate volume   Culture   Final    NO GROWTH 5 DAYS Performed at [facility], [address] [place], [place] [address]    Report Status 10/28/2021 FINAL  Final  Culture, blood (Routine X 2) w Reflex to ID Panel     Status: None   Collection Time: 10/24/21 12:06 AM   Specimen: BLOOD  Result Value Ref Range Status   Specimen  Description BLOOD LEFT HAND  Final   Special Requests   Final    BOTTLES DRAWN AEROBIC AND ANAEROBIC Blood Culture adequate volume   Culture   Final    NO GROWTH 5 DAYS Performed at [facility], [address] [place], [place] [address]    Report Status 10/29/2021 FINAL  Final  MRSA Next Gen by PCR, Nasal     Status: None   Collection Time: 10/24/21  2:24 PM   Specimen: Nasal Mucosa; Nasal Swab  Result Value Ref Range Status   MRSA by PCR Next Gen NOT DETECTED NOT DETECTED Final    Comment: (NOTE) The GeneXpert MRSA Assay (FDA approved for NASAL specimens only), is one component of a comprehensive MRSA colonization surveillance program. It is not intended to diagnose MRSA infection nor to guide or monitor treatment for MRSA infections. Test performance is not FDA approved in patients less than 37 years old. Performed at [facility], [place], [place] [address]   Respiratory (~20 pathogens) panel by PCR     Status: None   Collection Time: 10/24/21  2:24 PM   Specimen: Nasopharyngeal Swab; Respiratory  Result Value Ref Range Status   Adenovirus NOT DETECTED NOT DETECTED Final   Coronavirus 229E NOT DETECTED NOT DETECTED Final    Comment: (NOTE) The Coronavirus on the Respiratory Panel, DOES NOT test for the novel  Coronavirus (2019 nCoV)    Coronavirus HKU1 NOT DETECTED NOT DETECTED Final   Coronavirus NL63 NOT DETECTED NOT DETECTED Final   Coronavirus OC43 NOT DETECTED NOT DETECTED Final   Metapneumovirus NOT DETECTED NOT DETECTED Final   Rhinovirus / Enterovirus NOT DETECTED NOT DETECTED Final   Influenza A NOT DETECTED NOT DETECTED Final   Influenza B NOT DETECTED NOT DETECTED Final   Parainfluenza Virus 1 NOT DETECTED NOT DETECTED Final   Parainfluenza Virus 2 NOT DETECTED NOT DETECTED Final   Parainfluenza Virus 3 NOT DETECTED NOT DETECTED Final   Parainfluenza Virus 4 NOT DETECTED NOT DETECTED Final   Respiratory Syncytial Virus NOT  DETECTED NOT DETECTED Final   Bordetella pertussis NOT DETECTED NOT DETECTED Final   Bordetella Parapertussis NOT DETECTED NOT DETECTED Final   Chlamydophila pneumoniae NOT DETECTED NOT DETECTED Final   Mycoplasma pneumoniae NOT DETECTED NOT DETECTED Final    Comment: Performed at [facility], [place], [place], [place] [address]    Coagulation Studies: No results for input(s): "LABPROT", "INR" in the last 72 hours.  Urinalysis: No results for input(s): "COLORURINE", "LABSPEC", "PHURINE", "GLUCOSEU", "HGBUR", "BILIRUBINUR", "KETONESUR", "PROTEINUR", "UROBILINOGEN", "NITRITE", "LEUKOCYTESUR" in the last 72 hours.  Invalid input(s): "APPERANCEUR"    Imaging: No results found.   Medications:      (feeding supplement) PROSource Plus  30 mL Oral BID BM   carvedilol  12.5 mg Oral QPM   cephALEXin  500 mg Oral Q12H   Chlorhexidine Gluconate Cloth  6 each Topical Q0600  citalopram  10 mg Oral Daily   doxycycline  100 mg Oral Q12H   epoetin alfa       epoetin (EPOGEN/PROCRIT) injection  4,000 Units Intravenous Q M,W,F-HD   furosemide  40 mg Oral Daily   heparin  5,000 Units Subcutaneous Q12H   lactulose  20 g Oral BID   lubiprostone  24 mcg Oral BID WC   multivitamin  1 tablet Oral QHS   pantoprazole  40 mg Oral BID   sodium chloride flush  3 mL Intravenous Q12H   acetaminophen **OR** acetaminophen, albuterol, alteplase, epoetin alfa, guaiFENesin, heparin, lidocaine (PF), lidocaine-prilocaine, loperamide, ondansetron (ZOFRAN) IV, oxyCODONE-acetaminophen, pentafluoroprop-tetrafluoroeth, senna-docusate, tiZANidine, topiramate  Assessment/ Plan:  Katie [name] is a 66 y.o.  female with past medical history of ESRD with hemodialysis TTS, diabetes mellitus, hypertension to the hospital seeking long term placement. She was recently admitted and received antibiotics for a dog bite.  She was discharged with a prescription for home antibiotics.  She states her caregivers  were unable to retrieve antibiotics.  She states that there was an argument and that she is here seeking long-term placement.  Principal Problem:   Adjustment disorder with mixed anxiety and depressed mood Active Problems:   Hypertension   Type 2 diabetes mellitus (HCC)   ESRD (end stage renal disease) ([place])   Financial difficulty   Anemia in chronic kidney disease   Sepsis ([place])   Cellulitis   Bent bone fracture of ulna   Hospital acquired PNA   Fecal impaction (HCC)   Aortic stenosis   Left arm cellulitis   [facility]/MWF/right PermCath/83.5 kg  End Stage Renal Disease on hemodialysis:  Receiving dialysis today, UF 1.5L as tolerated. Next treatment scheduled for Friday. Renal navigator currently seeking outpatient clinic acceptance in [facility] with SNF was accepted.   2. Hypertension with chronic kidney disease:   Patient remains on carvedilol and furosemide only.  Blood pressure  126/62 during dialysis.   BP 126/62   Pulse 74   Temp 98.1 F (36.7 C) (Oral)   Resp 17   Ht 5\' 6"  (1.676 m)   Wt 75.1 kg   SpO2 100%   BMI 26.72 kg/m   3. Anemia of chronic kidney disease/ kidney injury/chronic disease/acute blood loss:   Lab Results  Component Value Date   HGB 9.3 (L) 11/15/2021  Hemoglobin within desired target. Continue EPO with dialysis treatments.  4. Secondary Hyperparathyroidism:     Lab Results  Component Value Date   PTH 70 (H) 05/12/2020   CALCIUM 8.6 (L) 11/15/2021   PHOS 3.8 11/15/2021   We will continue to monitor bone minerals during this admission  5.  Severe constipation Managed with stool softeners.    LOS: [facility] 7/5/202312:08 PM

## 2021-11-16 DIAGNOSIS — L03114 Cellulitis of left upper limb: Secondary | ICD-10-CM | POA: Diagnosis not present

## 2021-11-16 DIAGNOSIS — N186 End stage renal disease: Secondary | ICD-10-CM | POA: Diagnosis not present

## 2021-11-16 LAB — MAGNESIUM: Magnesium: 1.9 mg/dL (ref 1.7–2.4)

## 2021-11-16 LAB — BASIC METABOLIC PANEL
Anion gap: 7 (ref 5–15)
BUN: 16 mg/dL (ref 8–23)
CO2: 28 mmol/L (ref 22–32)
Calcium: 9 mg/dL (ref 8.9–10.3)
Chloride: 102 mmol/L (ref 98–111)
Creatinine, Ser: 3.99 mg/dL — ABNORMAL HIGH (ref 0.44–1.00)
GFR, Estimated: 12 mL/min — ABNORMAL LOW (ref >60)
Glucose, Bld: 111 mg/dL — ABNORMAL HIGH (ref 70–99)
Potassium: 4 mmol/L (ref 3.5–5.1)
Sodium: 137 mmol/L (ref 135–145)

## 2021-11-16 LAB — GLUCOSE, CAPILLARY
Glucose-Capillary: 98 mg/dL (ref 70–99)
Glucose-Capillary: 101 mg/dL — ABNORMAL HIGH (ref 70–99)

## 2021-11-16 LAB — PHOSPHORUS: Phosphorus: 3.3 mg/dL (ref 2.5–4.6)

## 2021-11-16 NOTE — TOC Progression Note (Signed)
Transition of Care [facility]) - Progression Note    Patient Details  Name: Katie [name] MRN: [id number] Date of Birth: 03-30-1956  [facility] CM/SW Contact  Eileen [place], LCSW Phone Number: 11/16/2021, 3:02 PM  Clinical Narrative:   CSW spoke with [place] at [facility]. They came to eval pt today. However, pt did not get up and move. Per [place] they have to see her get up and move before they can accept her. [facility] also requesting FL2 and some clinicals. All paperwork was faxed to [place] at [phone]. Per [place] they will come back and eval pt again Monday 7/10 at 9:30-- pt will need to get up and move during this assessment. CSW will notified RN on Monday. If pt is able to go to [facility] she will remain at her home [place] [facility].     Expected Discharge Plan: Skilled Nursing Facility Barriers to Discharge: SNF Pending bed offer  Expected Discharge Plan and Services Expected Discharge Plan: [place]   Discharge Planning Services: CM Consult Post Acute Care Choice: [place] Living arrangements for the past 2 months: Single Family Home                 DME Arranged: N/A DME Agency: NA       HH Arranged: NA HH Agency: NA         Social Determinants of Health (SDOH) Interventions    Readmission Risk Interventions    09/27/2021    1:44 PM 09/26/2021   12:46 PM 03/08/2021    2:49 PM  Readmission Risk Prevention Plan  Transportation Screening  Complete Complete  Palliative Care Screening Not Applicable    Medication Review (RN Care Manager)  Complete Complete  HRI or Home Care Consult   Complete  SW Recovery Care/Counseling Consult  Complete   Palliative Care Screening  Not Applicable   [place]  Not Applicable Complete

## 2021-11-16 NOTE — Progress Notes (Signed)
Progress Note    Katie [name]  HLK:[id number] DOB: 05-13-1956  DOA: 09/30/2021 PCP: Pcp, No      Brief Narrative:    Medical records reviewed and are as summarized below:  Katie [name] is a 66 y.o. female with medical history significant of ESRD, DM II, homelessness. Had been admitted 05/13-05/17 for sepsis d/t cellulits from animal bite to arm, discharged on po abx and was unable to fill the Rx, was kicked out of where she was living, no way to get HD.  Patient has been in the ED since arriving the hospital.  She was admitted to the hospital again on 10/23/2021 as patient developed cough short of breath and fever.  Chest x-ray showed pneumonia.  Patient was diagnosed with sepsis and pneumonia, was started on cefepime and vancomycin. Had MRI performed on 6/14 did not show any osteomyelitis. Patient is currently homeless, has a severe weakness.  No discharge option at this time. 6/15.  Patient has no nausea, KUB showed fecal impaction with severe dilated colon, patient was started on senna as well as scheduled lactulose.  CT chest also showed dense right lower lobe consolidation consistent with aspiration pneumonia.  CT of the left shoulder did not show any osteomyelitis or septic arthritis. Fecal impaction resolved after [place] on 6/17. Patient condition has stabilized, pending nursing home placement. 6/29.  Patient had a significant left upper extremity swelling, duplex ultrasound did not show DVT.  She was started on doxycycline and Keflex for cellulitis of the left upper extremity.      Assessment/Plan:   Principal Problem:   Adjustment disorder with mixed anxiety and depressed mood Active Problems:   Cellulitis   Hypertension   Type 2 diabetes mellitus (HCC)   ESRD (end stage renal disease) (HCC)   Financial difficulty   Anemia in chronic kidney disease   Sepsis ([place])   Bent bone fracture of ulna   Hospital acquired PNA   Fecal impaction (HCC)   Aortic stenosis    Left arm cellulitis   Nutrition Problem: Increased nutrient needs Etiology: catabolic illness (ESRD on HD)  Signs/Symptoms: estimated needs   Body mass index is 26.37 kg/m.  Left upper extremity cellulitis: Continue doxycycline and Keflex to complete 10 days of antibiotics on 11/18/2021.  Continue probiotics because of recent diarrhea.    Vomiting and diarrhea: Improved.  Amitiza and lactulose were discontinued on 11/15/2021.  Continue Imodium as needed for diarrhea.    ESRD on HD: She is on MWF schedule.  Follow-up with nephrologist for hemodialysis.  Recent sepsis from pneumonia: Completed antibiotics for that  Constipation, fecal impaction, hypokalemia: Resolved  Other comorbidities include mild aortic stenosis, probable depression, type II DM with occasional hypoglycemia, anemia of chronic kidney disease    Diet Order             Diet renal with fluid restriction Fluid restriction: 1200 mL Fluid; Room service appropriate? Yes; Fluid consistency: Thin  Diet effective now                            Consultants: Psychiatrist, nephrologist, gastroenterologist, orthopedic surgeon, infectious disease  Procedures: None    Medications:    (feeding supplement) PROSource Plus  30 mL Oral BID BM   acidophilus  2 capsule Oral TID   carvedilol  12.5 mg Oral QPM   cephALEXin  500 mg Oral Q12H   Chlorhexidine Gluconate Cloth  6 each Topical Q0600  citalopram  10 mg Oral Daily   doxycycline  100 mg Oral Q12H   epoetin (EPOGEN/PROCRIT) injection  4,000 Units Intravenous Q M,W,F-HD   furosemide  40 mg Oral Daily   heparin  5,000 Units Subcutaneous Q12H   multivitamin  1 tablet Oral QHS   pantoprazole  40 mg Oral BID   sodium chloride flush  3 mL Intravenous Q12H   Continuous Infusions:   Anti-infectives (From admission, onward)    Start     Dose/Rate Route Frequency Ordered Stop   11/09/21 1215  doxycycline (VIBRA-TABS) tablet 100 mg        100 mg Oral  Every 12 hours 11/09/21 1122 11/19/21 0959   11/09/21 1215  cephALEXin (KEFLEX) capsule 500 mg        500 mg Oral Every 12 hours 11/09/21 1123 11/19/21 0959   10/24/21 1200  vancomycin (VANCOCIN) IVPB 1000 mg/200 mL premix  Status:  Discontinued        1,000 mg 200 mL/hr over 60 Minutes Intravenous Every T-Th-Sa (Hemodialysis) 10/23/21 2017 10/27/21 1303   10/24/21 1200  ceFEPIme (MAXIPIME) 2 g in sodium chloride 0.9 % 100 mL IVPB        2 g 200 mL/hr over 30 Minutes Intravenous Every T-Th-Sa (Hemodialysis) 10/23/21 2017 10/29/21 2359   10/23/21 1615  vancomycin (VANCOREADY) IVPB 1750 mg/350 mL        1,750 mg 175 mL/hr over 120 Minutes Intravenous  Once 10/23/21 1614 10/23/21 1906   10/23/21 1530  vancomycin (VANCOCIN) IVPB 1000 mg/200 mL premix  Status:  Discontinued        1,000 mg 200 mL/hr over 60 Minutes Intravenous  Once 10/23/21 1526 10/23/21 1614   10/23/21 1530  ceFEPIme (MAXIPIME) 2 g in sodium chloride 0.9 % 100 mL IVPB        2 g 200 mL/hr over 30 Minutes Intravenous  Once 10/23/21 1526 10/23/21 1703   10/02/21 2200  amoxicillin-clavulanate (AUGMENTIN) 500-125 MG per tablet 500 mg  Status:  Discontinued        1 tablet Oral Every 12 hours 10/02/21 1454 10/13/21 1151   09/30/21 2200  amoxicillin-clavulanate (AUGMENTIN) 875-125 MG per tablet 1 tablet  Status:  Discontinued        1 tablet Oral Every 12 hours 09/30/21 1803 10/02/21 1454   09/30/21 1545  Ampicillin-Sulbactam (UNASYN) 3 g in sodium chloride 0.9 % 100 mL IVPB        3 g 200 mL/hr over 30 Minutes Intravenous  Once 09/30/21 1530 09/30/21 1629              Family Communication/Anticipated D/C date and plan/Code Status   DVT prophylaxis: heparin injection 5,000 Units Start: 10/23/21 2330     Code Status: Full Code  Family Communication: None Disposition Plan: Awaiting placement   Status is: Inpatient Remains inpatient appropriate because: Awaiting placement   Subjective:   Interval events  noted.  No vomiting or diarrhea today.  She still has some pain in the left upper extremity although it is slowly improving.  Objective:    Vitals:   11/15/21 2055 11/16/21 0441 11/16/21 0500 11/16/21 0725  BP: (!) 149/65 (!) 111/51  (!) 155/61  Pulse: 74 65  73  Resp: 18 16  19   Temp: 98.9 F (37.2 C) 98.5 F (36.9 C)  98.6 F (37 C)  TempSrc:      SpO2: 97% 95%  96%  Weight:   74.1 kg   Height:  No data found.   Intake/Output Summary (Last 24 hours) at 11/16/2021 1301 Last data filed at 11/16/2021 1000 Gross per 24 hour  Intake 723 ml  Output --  Net 723 ml   Filed Weights   11/15/21 0500 11/15/21 0907 11/16/21 0500  Weight: 78.9 kg 75.1 kg 74.1 kg    Exam:  GEN: NAD SKIN: Warm and dry EYES: No pallor or icterus ENT: MMM CV: RRR PULM: CTA B ABD: soft, ND, NT, +BS CNS: AAO x 3, non focal EXT: Swelling, tenderness and erythema of the left forearm.  Mild tenderness of the left arm         Data Reviewed:   I have personally reviewed following labs and imaging studies:  Labs: Labs show the following:   Basic Metabolic Panel: Recent Labs  Lab 11/10/21 1243 11/13/21 0945 11/15/21 0940 11/16/21 0844  NA 137 141 140 137  K 4.1 3.7 3.7 4.0  CL 101 106 103 102  CO2 27 28 27 28   GLUCOSE 124* 113* 128* 111*  BUN 25* 34* 28* 16  CREATININE 4.78* 5.67* 5.86* 3.99*  CALCIUM 8.9 8.2* 8.6* 9.0  MG  --   --   --  1.9  PHOS 3.0 3.6 3.8 3.3   GFR Estimated Creatinine Clearance: 14.9 mL/min (A) (by C-G formula based on SCr of 3.99 mg/dL (H)). Liver Function Tests: Recent Labs  Lab 11/10/21 1243 11/13/21 0945 11/15/21 0940  ALBUMIN 2.9* 2.5* 2.7*   No results for input(s): "LIPASE", "AMYLASE" in the last 168 hours. No results for input(s): "AMMONIA" in the last 168 hours. Coagulation profile No results for input(s): "INR", "PROTIME" in the last 168 hours.  CBC: Recent Labs  Lab 11/10/21 1252 11/13/21 0945 11/15/21 0940  WBC 8.3 5.9 6.3   HGB 10.0* 9.0* 9.3*  HCT 33.0* 30.0* 31.1*  MCV 96.2 98.0 97.8  PLT 338 266 282   Cardiac Enzymes: No results for input(s): "CKTOTAL", "CKMB", "CKMBINDEX", "TROPONINI" in the last 168 hours. BNP (last 3 results) No results for input(s): "PROBNP" in the last 8760 hours. CBG: Recent Labs  Lab 11/11/21 1619 11/12/21 0932 11/14/21 0749 11/15/21 0740 11/16/21 0727  GLUCAP 134* 91 96 88 98   D-Dimer: No results for input(s): "DDIMER" in the last 72 hours. Hgb A1c: No results for input(s): "HGBA1C" in the last 72 hours. Lipid Profile: No results for input(s): "CHOL", "HDL", "LDLCALC", "TRIG", "CHOLHDL", "LDLDIRECT" in the last 72 hours. Thyroid function studies: No results for input(s): "TSH", "T4TOTAL", "T3FREE", "THYROIDAB" in the last 72 hours.  Invalid input(s): "FREET3" Anemia work up: No results for input(s): "VITAMINB12", "FOLATE", "FERRITIN", "TIBC", "IRON", "RETICCTPCT" in the last 72 hours. Sepsis Labs: Recent Labs  Lab 11/10/21 1252 11/13/21 0945 11/15/21 0940  WBC 8.3 5.9 6.3    Microbiology No results found for this or any previous visit (from the past 240 hour(s)).  Procedures and diagnostic studies:  No results found.             LOS: 24 days   [facility]   Pager on [email or web address]. If 7PM-7AM, please contact night-coverage at [email or web address]     11/16/2021, 1:01 PM

## 2021-11-16 NOTE — NC FL2 (Signed)
[place] LEVEL OF CARE SCREENING TOOL     IDENTIFICATION  Patient Name: Katie [name] Birthdate: 04/28/1956 Sex: female Admission Date (Current Location): 09/30/2021  [place] and [place] Number:  Engineering geologist and Address:  [facility], [address], [place], [place] [address]      Provider Number: [id number]  Attending Physician Name and Address:  Jennye Boroughs, MD  Relative Name and Phone Number:       Current Level of Care: Hospital Recommended Level of Care: [place] Prior Approval Number:    Date Approved/Denied:   PASRR Number: [id number]  Discharge Plan: Other (Comment) (ALF)    Current Diagnoses: Patient Active Problem List   Diagnosis Date Noted   Left arm cellulitis 11/09/2021   Adjustment disorder with mixed anxiety and depressed mood 11/07/2021   Aortic stenosis 11/01/2021   Fecal impaction ([place]) 10/27/2021   Bent bone fracture of ulna 10/24/2021   Hospital acquired PNA 10/24/2021   Sepsis ([place]) 10/23/2021   Cellulitis 10/23/2021   Right flank pain 09/23/2021   Iron deficiency anemia, unspecified 03/27/2021   Allergy, unspecified, initial encounter 03/22/2021   Anaphylactic shock, unspecified, initial encounter 03/22/2021   Unspecified jaundice 03/15/2021   Allergy status to penicillin 03/14/2021   Anemia in chronic kidney disease 03/14/2021   Chronic diastolic (congestive) heart failure ([place]) 03/14/2021   Dependence on renal dialysis ([place]) 03/14/2021   Depression, unspecified 03/14/2021   Hypertensive heart and chronic kidney disease with heart failure and with stage 5 chronic kidney disease, or end stage renal disease ([place]) 03/14/2021   Personal history of nicotine dependence 03/14/2021   Secondary hyperparathyroidism of renal origin ([place]) 03/14/2021   Frequent falls 03/07/2021   Homelessness 03/07/2021   Financial difficulty 03/07/2021   ESRD (end stage renal disease) ([place])  12/23/2020   Persistent headaches 06/27/2020   ATN (acute tubular necrosis) ([place])    Pulmonary hypertension, unspecified (HCC)    Chronic kidney disease (CKD), stage IV (severe) (HCC)    Pleural effusion    Acute heart failure with preserved ejection fraction (HFpEF) ([place])    Pressure injury of skin 05/01/2020   Hyperkalemia    Volume overload 04/30/2020   Hypertension    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HCC)    Type 2 diabetes mellitus ([place])     Orientation RESPIRATION BLADDER Height & Weight     Self, Time, Place, Situation  Normal Continent Weight: 163 lb 5.8 oz (74.1 kg) Height:  5\' 6"  (167.6 cm)  BEHAVIORAL SYMPTOMS/MOOD NEUROLOGICAL BOWEL NUTRITION STATUS      Continent Diet (renal with fluid restriction Fluid restriction: 1200 mL Fluid)  AMBULATORY STATUS COMMUNICATION OF NEEDS Skin   Limited Assist Verbally Other (Comment) (laceration on left finger from dog bite)                       Personal Care Assistance Level of Assistance  Dressing, Feeding, Bathing Bathing Assistance: Limited assistance Feeding assistance: Independent Dressing Assistance: Limited assistance     Functional Limitations Info  Sight, Hearing, Speech Sight Info: Adequate Hearing Info: Adequate Speech Info: Adequate    SPECIAL CARE FACTORS FREQUENCY  PT (By licensed PT), OT (By licensed OT)     PT Frequency: 2x OT Frequency: 2x            Contractures Contractures Info: Not present    Additional Factors Info  Code Status, Allergies Code Status Info: full code  Allergies Info: penicillins           Current Medications (11/16/2021):  This is the current hospital active medication list Current Facility-Administered Medications  Medication Dose Route Frequency Provider Last Rate Last Admin   (feeding supplement) PROSource Plus liquid 30 mL  30 mL Oral BID BM Sharen Hones, MD   30 mL at 11/16/21 0842   acetaminophen (TYLENOL) tablet 650 mg  650 mg Oral Q6H PRN  Para Skeans, MD   650 mg at 10/24/21 1728   Or   acetaminophen (TYLENOL) suppository 650 mg  650 mg Rectal Q6H PRN Para Skeans, MD       acidophilus (RISAQUAD) capsule 2 capsule  2 capsule Oral TID Jennye Boroughs, MD   2 capsule at 11/16/21 0840   albuterol (PROVENTIL) (2.5 MG/3ML) 0.083% nebulizer solution 3 mL  3 mL Inhalation Q6H PRN Sharen Hones, MD       carvedilol (COREG) tablet 12.5 mg  12.5 mg Oral QPM Florina Ou V, MD   12.5 mg at 11/15/21 1836   cephALEXin (KEFLEX) capsule 500 mg  500 mg Oral Q12H Jennye Boroughs, MD   500 mg at 11/16/21 0841   Chlorhexidine Gluconate Cloth 2 % PADS 6 each  6 each Topical Q0600 Para Skeans, MD   6 each at 11/16/21 0840   citalopram (CELEXA) tablet 10 mg  10 mg Oral Daily Sharen Hones, MD   10 mg at 11/16/21 0841   doxycycline (VIBRA-TABS) tablet 100 mg  100 mg Oral Q12H Jennye Boroughs, MD   100 mg at 11/16/21 0841   epoetin alfa (EPOGEN) injection 4,000 Units  4,000 Units Intravenous Q M,W,F-HD Colon Flattery, NP   4,000 Units at 11/15/21 1157   furosemide (LASIX) tablet 40 mg  40 mg Oral Daily Breeze, Benancio Deeds, NP   40 mg at 11/16/21 0841   guaiFENesin (ROBITUSSIN) 100 MG/5ML liquid 10 mL  10 mL Oral Q4H PRN Para Skeans, MD   10 mL at 11/14/21 0018   heparin injection 5,000 Units  5,000 Units Subcutaneous Q12H Para Skeans, MD   5,000 Units at 11/16/21 0841   loperamide (IMODIUM) capsule 2 mg  2 mg Oral PRN Sharen Hones, MD   2 mg at 11/15/21 1401   multivitamin (RENA-VIT) tablet 1 tablet  1 tablet Oral QHS Emeterio Reeve, DO   1 tablet at 11/15/21 2239   ondansetron (ZOFRAN) injection 4 mg  4 mg Intravenous Q6H PRN Murlean Iba, MD   4 mg at 11/15/21 2237   oxyCODONE-acetaminophen (PERCOCET/ROXICET) 5-325 MG per tablet 1 tablet  1 tablet Oral Q6H PRN Para Skeans, MD   1 tablet at 11/16/21 1212   pantoprazole (PROTONIX) EC tablet 40 mg  40 mg Oral BID Darnelle Bos, RPH   40 mg at 11/16/21 0840   senna-docusate (Senokot-S) tablet  2 tablet  2 tablet Oral BID PRN Sharen Hones, MD       sodium chloride flush (NS) 0.9 % injection 3 mL  3 mL Intravenous Q12H Florina Ou V, MD   3 mL at 11/16/21 0841   tiZANidine (ZANAFLEX) tablet 4 mg  4 mg Oral BID PRN Para Skeans, MD   4 mg at 11/15/21 2238   topiramate (TOPAMAX) tablet 50 mg  50 mg Oral BID PRN Para Skeans, MD   50 mg at 11/06/21 1609     Discharge Medications: Please see discharge summary for a list of discharge medications.  Relevant Imaging  Results:  Relevant Lab Results:   Additional Information [id number]  Eileen Stanford, LCSW

## 2021-11-16 NOTE — Progress Notes (Signed)
Physical Therapy Treatment Patient Details Name: Katie [name] MRN: [id number] DOB: [birth year] Today's Date: 11/16/2021   History of Present Illness Pt is a 66 y.o. female presenting to hospital 5/20 with concerns for worsening L hand pain d/t animal bite (pt recently admitted 5/13-5/17 for same reason). Pt's friend dropped her off at dialysis and refused to do any more (pt now homeless).  PMH includes ESRD on HD TTS, DM, htn, orthostatic hypotension, and h/o mandible surgery.    PT Comments    Pt was pleasant and motivated to participate during the session and put forth good effort throughout. Pt able to complete bed mobility with supervision. Pt is able to complete sit to stand w/ CGA and extra time and effort to complete. Upon standing pt reported lightheadedness and dizzines and needing to lay down. BP assessed in supine102/45, sitting 96/61, standing 73/51 and reporting symptoms even after compeleting seated therex to facilitate blood flow. Pt again moved back into supine and able to improve symptoms after a short period of time. Pt agreeable to complete seated therex EOB. SpO2 and HR WNL with other adverse symptoms reported/observed. Pt placed back on 2L per her request. Pt will benefit from PT services in a SNF setting upon discharge to safely address deficits listed in patient problem list for decreased caregiver assistance and eventual return to PLOF.    Recommendations for follow up therapy are one component of a multi-disciplinary discharge planning process, led by the attending physician.  Recommendations may be updated based on patient status, additional functional criteria and insurance authorization.  Follow Up Recommendations  Skilled nursing-short term rehab (<3 hours/day) Can patient physically be transported by private vehicle: Yes   Assistance Recommended at Discharge Frequent or constant Supervision/Assistance  Patient can return home with the following A little help with  walking and/or transfers;A little help with bathing/dressing/bathroom;Assistance with cooking/housework;Assist for transportation;Help with stairs or ramp for entrance;Direct supervision/assist for medications management   Equipment Recommendations  Other (comment) (TBD)    Recommendations for Other Services       Precautions / Restrictions Precautions Precautions: Fall Precaution Comments: R IJ permcath; Restrictions Weight Bearing Restrictions: No     Mobility  Bed Mobility Overal bed mobility: Needs Assistance Bed Mobility: Supine to Sit, Sit to Supine Rolling: Supervision              Transfers Overall transfer level: Needs assistance Equipment used: Rolling walker (2 wheels) Transfers: Sit to/from Stand Sit to Stand: Min guard           General transfer comment: extra time and effort to complete    Ambulation/Gait               General Gait Details: unable at this time due to symptomatic orthostatic BP   Stairs             Wheelchair Mobility    Modified Rankin (Stroke Patients Only)       Balance Overall balance assessment: Needs assistance Sitting-balance support: Feet supported, Bilateral upper extremity supported Sitting balance-Leahy Scale: Good     Standing balance support: Bilateral upper extremity supported, During functional activity, Single extremity supported Standing balance-Leahy Scale: Fair                              Cognition Arousal/Alertness: Awake/alert Behavior During Therapy: WFL for tasks assessed/performed Overall Cognitive Status: Within Functional Limits for tasks assessed  Exercises General Exercises - Lower Extremity Ankle Circles/Pumps: Strengthening, Both, 10 reps Quad Sets: Strengthening, Both, 10 reps Gluteal Sets: Strengthening, Both, 10 reps Long Arc Quad: Strengthening, Both, 10 reps Hip Flexion/Marching: Seated,  Strengthening, Both, 10 reps    General Comments        Pertinent Vitals/Pain Pain Assessment Pain Assessment: 0-10 Pain Score: 6  Pain Location: neck and back Pain Descriptors / Indicators: Grimacing, Discomfort Pain Intervention(s): Monitored during session, Repositioned, Premedicated before session    Home Living                          Prior Function            PT Goals (current goals can now be found in the care plan section) Progress towards PT goals: Progressing toward goals    Frequency    Min 2X/week      PT Plan Current plan remains appropriate    Co-evaluation              AM-PAC PT "6 Clicks" Mobility   Outcome Measure  Help needed turning from your back to your side while in a flat bed without using bedrails?: None Help needed moving from lying on your back to sitting on the side of a flat bed without using bedrails?: None Help needed moving to and from a bed to a chair (including a wheelchair)?: A Little Help needed standing up from a chair using your arms (e.g., wheelchair or bedside chair)?: A Little Help needed to walk in hospital room?: A Little Help needed climbing 3-5 steps with a railing? : A Little 6 Click Score: 20    End of Session Equipment Utilized During Treatment: Gait belt Activity Tolerance: Other (comment) (limited by symptomatic orthostatic BP) Patient left: in bed;with call bell/phone within reach;with bed alarm set Nurse Communication: Mobility status PT Visit Diagnosis: Other abnormalities of gait and mobility (R26.89);Unsteadiness on feet (R26.81);Muscle weakness (generalized) (M62.81);History of falling (Z91.81)     Time: 1400-1430 PT Time Calculation (min) (ACUTE ONLY): 30 min  Charges:                        Turner Daniels, SPT  11/16/2021, 3:50 PM

## 2021-11-16 NOTE — Plan of Care (Signed)

## 2021-11-16 NOTE — Progress Notes (Signed)
   11/15/21 1030 11/15/21 1045 11/15/21 1100  Vitals  BP 128/62 (!) 117/54 (!) 111/54  MAP (mmHg) 82 74 72  Pulse Rate 70 69 72  ECG Heart Rate 70 69 71  Resp 16 20 18   During Treatment Monitoring  Blood Flow Rate (mL/min) 400 mL/min 400 mL/min 400 mL/min  Arterial Pressure (mmHg) -210 mmHg -210 mmHg -210 mmHg  Venous Pressure (mmHg) 120 mmHg 120 mmHg 120 mmHg  Transmembrane Pressure (mmHg) 50 mmHg 50 mmHg 50 mmHg  Ultrafiltration Rate (mL/min) 670 mL/min 670 mL/min 670 mL/min  Dialysate Flow Rate (mL/min) 500 ml/min 500 ml/min 500 ml/min  Conductivity 13.9 13.9 13.9  HD Safety Checks Performed Yes Yes Yes  Dialysis Fluid Bolus  --   --   --   Bolus Amount (mL)  --   --   --   Intra-Hemodialysis Comments Progressing as prescribed Progressing as prescribed Progressing as prescribed    11/15/21 1115 11/15/21 1130 11/15/21 1145  Vitals  BP 124/61 124/60 133/64  MAP (mmHg) 79 79 84  Pulse Rate 69 66 68  ECG Heart Rate 69 67 69  Resp 16 13 10   During Treatment Monitoring  Blood Flow Rate (mL/min) 400 mL/min 400 mL/min 400 mL/min  Arterial Pressure (mmHg) -210 mmHg -200 mmHg -200 mmHg  Venous Pressure (mmHg) 120 mmHg 120 mmHg 120 mmHg  Transmembrane Pressure (mmHg) 50 mmHg 50 mmHg 50 mmHg  Ultrafiltration Rate (mL/min) 670 mL/min 670 mL/min 670 mL/min  Dialysate Flow Rate (mL/min) 500 ml/min 500 ml/min 500 ml/min  Conductivity 13.9 13.9 13.9  HD Safety Checks Performed Yes Yes Yes  Dialysis Fluid Bolus  --   --   --   Bolus Amount (mL)  --   --   --   Intra-Hemodialysis Comments Progressing as prescribed Progressing as prescribed Progressing as prescribed    11/15/21 1200 11/15/21 1215 11/15/21 1220  Vitals  BP 126/62 109/60 (!) 94/57  MAP (mmHg) 81 76 69  Pulse Rate 74 72 70  ECG Heart Rate 76 73 70  Resp 17 (!) 22 (!) 21  During Treatment Monitoring  Blood Flow Rate (mL/min) 400 mL/min 400 mL/min  --   Arterial Pressure (mmHg) -200 mmHg -200 mmHg  --   Venous Pressure  (mmHg) 120 mmHg 120 mmHg  --   Transmembrane Pressure (mmHg) 50 mmHg 50 mmHg  --   Ultrafiltration Rate (mL/min) 670 mL/min 670 mL/min  --   Dialysate Flow Rate (mL/min) 500 ml/min 500 ml/min  --   Conductivity 13.9 13.9  --   HD Safety Checks Performed Yes Yes  --   Dialysis Fluid Bolus  --   --  Normal Saline  Bolus Amount (mL)  --   --  250 mL  Intra-Hemodialysis Comments Progressing as prescribed Progressing as prescribed Tx completed

## 2021-11-16 NOTE — Progress Notes (Signed)
   11/15/21 0924 11/15/21 0930 11/15/21 0945  Vitals  BP 118/62 140/66 129/68  MAP (mmHg) 78 87 86  Pulse Rate 74 77 71  ECG Heart Rate 75 76 72  Resp 19 13 20   During Treatment Monitoring  Blood Flow Rate (mL/min) 400 mL/min 400 mL/min 400 mL/min  Arterial Pressure (mmHg) -180 mmHg -190 mmHg -210 mmHg  Venous Pressure (mmHg) 110 mmHg 110 mmHg 110 mmHg  Transmembrane Pressure (mmHg) 50 mmHg 50 mmHg 50 mmHg  Ultrafiltration Rate (mL/min) 670 mL/min 670 mL/min 670 mL/min  Dialysate Flow Rate (mL/min) 500 ml/min 500 ml/min 500 ml/min  Conductivity 13.9 13.9 13.9  HD Safety Checks Performed Yes Yes Yes  Dialysis Fluid Bolus Normal Saline  --   --   Bolus Amount (mL) 250 mL  --   --   Intra-Hemodialysis Comments Tx initiated Progressing as prescribed Progressing as prescribed    11/15/21 1000 11/15/21 1015  Vitals  BP 132/60 (!) 120/59  MAP (mmHg) 81 76  Pulse Rate 73 73  ECG Heart Rate 72 74  Resp 19 17  During Treatment Monitoring  Blood Flow Rate (mL/min) 400 mL/min 400 mL/min  Arterial Pressure (mmHg) -210 mmHg -210 mmHg  Venous Pressure (mmHg) 110 mmHg 120 mmHg  Transmembrane Pressure (mmHg) 50 mmHg 50 mmHg  Ultrafiltration Rate (mL/min) 670 mL/min 670 mL/min  Dialysate Flow Rate (mL/min) 500 ml/min 500 ml/min  Conductivity 13.9 13.9  HD Safety Checks Performed Yes Yes  Dialysis Fluid Bolus  --   --   Bolus Amount (mL)  --   --   Intra-Hemodialysis Comments Progressing as prescribed Progressing as prescribed

## 2021-11-16 NOTE — Progress Notes (Signed)
11/13/21 0928 11/13/21 0946 11/13/21 1000  Vitals  BP  --  (!) 138/55  --   MAP (mmHg)  --  79  --   Pulse Rate  --  69 65  ECG Heart Rate  --  68 65  Resp  --   --  17  During Treatment Monitoring  Blood Flow Rate (mL/min)  --  400 mL/min 400 mL/min  Arterial Pressure (mmHg)  --  -130 mmHg -200 mmHg  Venous Pressure (mmHg)  --  80 mmHg 120 mmHg  Transmembrane Pressure (mmHg)  --  80 mmHg 60 mmHg  Ultrafiltration Rate (mL/min)  --  500 mL/min 500 mL/min  Dialysate Flow Rate (mL/min)  --  500 ml/min 500 ml/min  Conductivity  --  13.6 13.6  HD Safety Checks Performed  --  Yes Yes  Intra-Hemodialysis Comments  --  Progressing as prescribed;Tolerated well Progressing as prescribed;Tolerated well  Vitals  Weight 76.3 kg  --   --     11/13/21 1015 11/13/21 1030 11/13/21 1040  Vitals  BP (!) 163/58 (!) 164/61  --   MAP (mmHg) 88 90  --   Pulse Rate 64 64 65  ECG Heart Rate 65 64 65  Resp 18 (!) 22 18  During Treatment Monitoring  Blood Flow Rate (mL/min) 400 mL/min 400 mL/min  --   Arterial Pressure (mmHg) -250 mmHg -250 mmHg  --   Venous Pressure (mmHg) 120 mmHg 120 mmHg  --   Transmembrane Pressure (mmHg) 60 mmHg 60 mmHg  --   Ultrafiltration Rate (mL/min) 500 mL/min 500 mL/min  --   Dialysate Flow Rate (mL/min) 500 ml/min 500 ml/min  --   Conductivity 13.6 13.6  --   HD Safety Checks Performed Yes Yes  --   Intra-Hemodialysis Comments Progressing as prescribed;Tolerated well Progressing as prescribed;Tolerated well  --   Vitals  Weight  --   --   --     11/13/21 1045 11/13/21 1100 11/13/21 1115  Vitals  BP (!) 144/63 (!) 158/59 (!) 142/74  MAP (mmHg) 85 87 95  Pulse Rate 64 66 65  ECG Heart Rate 64 66 65  Resp 18  --   --   During Treatment Monitoring  Blood Flow Rate (mL/min) 400 mL/min 400 mL/min 400 mL/min  Arterial Pressure (mmHg) -190 mmHg -200 mmHg -200 mmHg  Venous Pressure (mmHg) 180 mmHg 180 mmHg 180 mmHg  Transmembrane Pressure (mmHg) 70 mmHg 70 mmHg 70  mmHg  Ultrafiltration Rate (mL/min) 500 mL/min 500 mL/min 500 mL/min  Dialysate Flow Rate (mL/min) 500 ml/min 500 ml/min 500 ml/min  Conductivity 13.6 13.6 13.6  HD Safety Checks Performed Yes Yes Yes  Intra-Hemodialysis Comments Progressing as prescribed;Tolerated well Progressing as prescribed;Tolerated well Progressing as prescribed;Tolerated well  Vitals  Weight  --   --   --     11/13/21 1130 11/13/21 1145 11/13/21 1159  Vitals  BP (!) 150/65 (!) 150/63 130/63  MAP (mmHg) 92 88 81  Pulse Rate 64 62 66  ECG Heart Rate 65 63 67  Resp  --   --   --   During Treatment Monitoring  Blood Flow Rate (mL/min) 400 mL/min 400 mL/min  --   Arterial Pressure (mmHg) -170 mmHg -170 mmHg  --   Venous Pressure (mmHg) 120 mmHg 120 mmHg  --   Transmembrane Pressure (mmHg) 70 mmHg 70 mmHg  --   Ultrafiltration Rate (mL/min) 500 mL/min 500 mL/min  --   Dialysate Flow Rate (  mL/min) 500 ml/min 500 ml/min  --   Conductivity 13.6 13.6  --   HD Safety Checks Performed Yes Yes  --   Intra-Hemodialysis Comments Progressing as prescribed;Tolerated well Progressing as prescribed;Tolerated well Tx completed  Vitals  Weight  --   --   --

## 2021-11-16 NOTE — TOC Progression Note (Addendum)
Transition of Care [facility]) - Progression Note    Patient Details  Name: Katie [name] MRN: [id number] Date of Birth: 04-16-1956  [facility] CM/SW Contact  Eileen Stanford, LCSW Phone Number: 11/16/2021, 11:33 AM  Clinical Narrative:   CSW seeking placement in [place] reached out to [facility] [place]- awaiting response.  Sharyn Lull states they have beds- referral emailed to her.   Expected Discharge Plan: Skilled Nursing Facility Barriers to Discharge: SNF Pending bed offer  Expected Discharge Plan and Services Expected Discharge Plan: [place]   Discharge Planning Services: CM Consult Post Acute Care Choice: [place] Living arrangements for the past 2 months: Single Family Home                 DME Arranged: N/A DME Agency: NA       HH Arranged: NA HH Agency: NA         Social Determinants of Health (SDOH) Interventions    Readmission Risk Interventions    09/27/2021    1:44 PM 09/26/2021   12:46 PM 03/08/2021    2:49 PM  Readmission Risk Prevention Plan  Transportation Screening  Complete Complete  Palliative Care Screening Not Applicable    Medication Review (RN Care Manager)  Complete Complete  HRI or Home Care Consult   Complete  SW Recovery Care/Counseling Consult  Complete   Palliative Care Screening  Not Applicable   [place]  Not Applicable Complete

## 2021-11-16 NOTE — Progress Notes (Signed)
In process of working with placement at [facility]. Insurance is within [employer], however, they may not be able to accommodate the MWF second shift chair that is required for placement.

## 2021-11-16 NOTE — Progress Notes (Signed)
Occupational Therapy Treatment Patient Details Name: Katie [name] MRN: [id number] DOB: [birth year] Today's Date: 11/16/2021   History of present illness Pt is a 66 y.o. female presenting to hospital 5/20 with concerns for worsening L hand pain d/t animal bite (pt recently admitted 5/13-5/17 for same reason). Pt's friend dropped her off at dialysis and refused to do any more (pt now homeless).  PMH includes ESRD on HD TTS, DM, htn, orthostatic hypotension, and h/o mandible surgery.   OT comments  Pt seen for OT tx. Pt endorsed stomach ache and just having worked recently with PT. Pt declined OOB but agreeable to EOB activity. Pt completed bed mobility with supervision. Pt instructed in trunk rotation and dynamic balance exercise incorporating propping on UE while reaching across midline with other UE, alternating L/R, x10 each. Pt endorsed moderate 5/10 SOB afterwards. Pt educated in use of PLB and pulse ox to support breath recovery with activity. Pt verbalized understanding. Pt continues to benefit from skilled OT Services.    Recommendations for follow up therapy are one component of a multi-disciplinary discharge planning process, led by the attending physician.  Recommendations may be updated based on patient status, additional functional criteria and insurance authorization.    Follow Up Recommendations  Skilled nursing-short term rehab (<3 hours/day)    Assistance Recommended at Discharge Intermittent Supervision/Assistance  Patient can return home with the following  A little help with walking and/or transfers;A lot of help with bathing/dressing/bathroom;Assistance with cooking/housework;Assist for transportation   Equipment Recommendations  BSC/3in1    Recommendations for Other Services      Precautions / Restrictions Precautions Precautions: Fall Precaution Comments: R IJ permcath; Restrictions Weight Bearing Restrictions: No       Mobility Bed Mobility Overal bed mobility:  Needs Assistance Bed Mobility: Supine to Sit, Sit to Supine     Supine to sit: Supervision Sit to supine: Supervision        Transfers                         Balance Overall balance assessment: Needs assistance Sitting-balance support: No upper extremity supported, Feet supported Sitting balance-Leahy Scale: Good                                     ADL either performed or assessed with clinical judgement   ADL                                              Extremity/Trunk Assessment              Vision       Perception     Praxis      Cognition Arousal/Alertness: Awake/alert Behavior During Therapy: WFL for tasks assessed/performed Overall Cognitive Status: Within Functional Limits for tasks assessed                                          Exercises Other Exercises Other Exercises: Pt instructed in ECS for ADL and PLB to support breath recovery after activity. Also educated in use of pulse oximeter to self monitor O2/HR. Other Exercises: Pt instructed in trunk rotation and dynamic balance exercise incorporating  propping on UE while reaching across midline with other UE, alternating L/R, x10 each. Pt endorsed moderate 5/10 SOB afterwards.    Shoulder Instructions       General Comments      Pertinent Vitals/ Pain       Pain Assessment Pain Assessment: No/denies pain  Home Living                                          Prior Functioning/Environment              Frequency  Min 2X/week        Progress Toward Goals  OT Goals(current goals can now be found in the care plan section)  Progress towards OT goals: OT to reassess next treatment;Progressing toward goals  Acute Rehab OT Goals Patient Stated Goal: feel better and be more independent OT Goal Formulation: With patient Time For Goal Achievement: 11/27/21 Potential to Achieve Goals: Good  Plan Discharge  plan remains appropriate;Frequency remains appropriate    Co-evaluation                 AM-PAC OT "6 Clicks" Daily Activity     Outcome Measure   Help from another person eating meals?: A Little Help from another person taking care of personal grooming?: A Little Help from another person toileting, which includes using toliet, bedpan, or urinal?: A Lot Help from another person bathing (including washing, rinsing, drying)?: A Lot Help from another person to put on and taking off regular upper body clothing?: A Little Help from another person to put on and taking off regular lower body clothing?: A Lot 6 Click Score: 15    End of Session    OT Visit Diagnosis: Unsteadiness on feet (R26.81) Pain - Right/Left: Left Pain - part of body: Arm   Activity Tolerance Patient tolerated treatment well   Patient Left in bed;with call bell/phone within reach;with bed alarm set   Nurse Communication          Time: 9794-8016 OT Time Calculation (min): 29 min  Charges: OT General Charges $OT Visit: 1 Visit OT Treatments $Therapeutic Activity: 23-37 mins  Ardeth Perfect., MPH, MS, OTR/L ascom [phone] 11/16/21, 3:50 PM

## 2021-11-17 DIAGNOSIS — L03114 Cellulitis of left upper limb: Secondary | ICD-10-CM | POA: Diagnosis not present

## 2021-11-17 DIAGNOSIS — N186 End stage renal disease: Secondary | ICD-10-CM | POA: Diagnosis not present

## 2021-11-17 LAB — GLUCOSE, CAPILLARY: Glucose-Capillary: 101 mg/dL — ABNORMAL HIGH (ref 70–99)

## 2021-11-17 MED ORDER — DIPHENHYDRAMINE HCL 50 MG/ML IJ SOLN
50.0000 mg | Freq: Once | INTRAMUSCULAR | Status: AC
Start: 2021-11-17 — End: 2021-11-17

## 2021-11-17 MED ORDER — ONDANSETRON HCL 4 MG/2ML IJ SOLN
INTRAMUSCULAR | Status: AC
  Filled 2021-11-17: qty 2

## 2021-11-17 MED ORDER — PSYLLIUM 95 % PO PACK
1.0000 | PACK | Freq: Every morning | ORAL | Status: DC
  Administered 2021-11-18 – 2021-11-27 (×10): 1 via ORAL
  Filled 2021-11-17 (×10): qty 1

## 2021-11-17 MED ORDER — EPOETIN ALFA 4000 UNIT/ML IJ SOLN
INTRAMUSCULAR | Status: AC
  Filled 2021-11-17: qty 1

## 2021-11-17 MED ORDER — DIPHENHYDRAMINE HCL 50 MG/ML IJ SOLN
INTRAMUSCULAR | Status: AC
  Administered 2021-11-17: 50 mg via INTRAVENOUS
  Filled 2021-11-17: qty 1

## 2021-11-17 MED ORDER — HEPARIN SODIUM (PORCINE) 1000 UNIT/ML IJ SOLN
INTRAMUSCULAR | Status: AC
  Filled 2021-11-17: qty 10

## 2021-11-17 MED ORDER — ONDANSETRON HCL 4 MG/2ML IJ SOLN
4.0000 mg | Freq: Once | INTRAMUSCULAR | Status: AC
  Administered 2021-11-17: 4 mg via INTRAVENOUS

## 2021-11-17 MED ORDER — ONDANSETRON HCL 4 MG/2ML IJ SOLN
INTRAMUSCULAR | Status: AC
  Administered 2021-11-17: 4 mg via INTRAVENOUS
  Filled 2021-11-17: qty 2

## 2021-11-17 NOTE — Progress Notes (Signed)
Still awaiting clinical acceptance at [facility].

## 2021-11-17 NOTE — Progress Notes (Signed)
Hemodialysis Post treatment  Patient tolerates dialysis without incident, cathter functions well, no evidence of infection. Patient reaches targeted UF, prescribed med's given. No acute changes noted.

## 2021-11-17 NOTE — Progress Notes (Addendum)
Progress Note    Katie [name]  NLG:[id number] DOB: 24-Dec-1955  DOA: 09/30/2021 PCP: Pcp, No      Brief Narrative:    Medical records reviewed and are as summarized below:  Katie [name] is a 66 y.o. female with medical history significant of ESRD, DM II, homelessness. Had been admitted 05/13-05/17 for sepsis d/t cellulits from animal bite to arm, discharged on po abx and was unable to fill the Rx, was kicked out of where she was living, no way to get HD.  Patient has been in the ED since arriving the hospital.  She was admitted to the hospital again on 10/23/2021 as patient developed cough short of breath and fever.  Chest x-ray showed pneumonia.  Patient was diagnosed with sepsis and pneumonia, was started on cefepime and vancomycin. Had MRI performed on 6/14 did not show any osteomyelitis. Patient is currently homeless, has a severe weakness.  No discharge option at this time. 6/15.  Patient has no nausea, KUB showed fecal impaction with severe dilated colon, patient was started on senna as well as scheduled lactulose.  CT chest also showed dense right lower lobe consolidation consistent with aspiration pneumonia.  CT of the left shoulder did not show any osteomyelitis or septic arthritis. Fecal impaction resolved after Nulytely on 6/17. Patient condition has stabilized, pending nursing home placement. 6/29.  Patient had a significant left upper extremity swelling, duplex ultrasound did not show DVT.  She was started on doxycycline and Keflex for cellulitis of the left upper extremity.      Assessment/Plan:   Principal Problem:   Adjustment disorder with mixed anxiety and depressed mood Active Problems:   Cellulitis   Hypertension   Type 2 diabetes mellitus (HCC)   ESRD (end stage renal disease) (HCC)   Financial difficulty   Anemia in chronic kidney disease   Sepsis ([facility])   Bent bone fracture of ulna   Hospital acquired PNA   Fecal impaction (HCC)   Aortic stenosis    Left arm cellulitis   Nutrition Problem: Increased nutrient needs Etiology: catabolic illness (ESRD on HD)  Signs/Symptoms: estimated needs   Body mass index is 26.37 kg/m.  Left upper extremity cellulitis: Continue doxycycline and Keflex.  Plan to complete 10 days of treatment on 11/18/2021. Continue probiotics because of recent diarrhea.    Vomiting and diarrhea: Improved.  Amitiza and lactulose were discontinued on 11/15/2021.   ESRD on HD: She is on MWF schedule.  Follow-up with nephrologist for hemodialysis.  Recent sepsis from pneumonia: Completed antibiotics for that  Constipation, fecal impaction, hypokalemia: Improved.  Patient requested Metamucil every morning for regular bowel movement.  Other comorbidities include mild aortic stenosis, probable depression, type II DM with occasional hypoglycemia, anemia of chronic kidney disease    Diet Order             Diet renal with fluid restriction Fluid restriction: 1200 mL Fluid; Room service appropriate? Yes; Fluid consistency: Thin  Diet effective now                            Consultants: Psychiatrist, nephrologist, gastroenterologist, orthopedic surgeon, infectious disease  Procedures: None    Medications:    (feeding supplement) PROSource Plus  30 mL Oral BID BM   acidophilus  2 capsule Oral TID   carvedilol  12.5 mg Oral QPM   cephALEXin  500 mg Oral Q12H   Chlorhexidine Gluconate Cloth  6 each  Topical Q0600   citalopram  10 mg Oral Daily   doxycycline  100 mg Oral Q12H   epoetin alfa       epoetin (EPOGEN/PROCRIT) injection  4,000 Units Intravenous Q M,W,F-HD   furosemide  40 mg Oral Daily   heparin  5,000 Units Subcutaneous Q12H   heparin sodium (porcine)       heparin sodium (porcine)       multivitamin  1 tablet Oral QHS   ondansetron       ondansetron       pantoprazole  40 mg Oral BID   sodium chloride flush  3 mL Intravenous Q12H   Continuous Infusions:   Anti-infectives  (From admission, onward)    Start     Dose/Rate Route Frequency Ordered Stop   11/09/21 1215  doxycycline (VIBRA-TABS) tablet 100 mg        100 mg Oral Every 12 hours 11/09/21 1122 11/19/21 0959   11/09/21 1215  cephALEXin (KEFLEX) capsule 500 mg        500 mg Oral Every 12 hours 11/09/21 1123 11/19/21 0959   10/24/21 1200  vancomycin (VANCOCIN) IVPB 1000 mg/200 mL premix  Status:  Discontinued        1,000 mg 200 mL/hr over 60 Minutes Intravenous Every T-Th-Sa (Hemodialysis) 10/23/21 2017 10/27/21 1303   10/24/21 1200  ceFEPIme (MAXIPIME) 2 g in sodium chloride 0.9 % 100 mL IVPB        2 g 200 mL/hr over 30 Minutes Intravenous Every T-Th-Sa (Hemodialysis) 10/23/21 2017 10/29/21 2359   10/23/21 1615  vancomycin (VANCOREADY) IVPB 1750 mg/350 mL        1,750 mg 175 mL/hr over 120 Minutes Intravenous  Once 10/23/21 1614 10/23/21 1906   10/23/21 1530  vancomycin (VANCOCIN) IVPB 1000 mg/200 mL premix  Status:  Discontinued        1,000 mg 200 mL/hr over 60 Minutes Intravenous  Once 10/23/21 1526 10/23/21 1614   10/23/21 1530  ceFEPIme (MAXIPIME) 2 g in sodium chloride 0.9 % 100 mL IVPB        2 g 200 mL/hr over 30 Minutes Intravenous  Once 10/23/21 1526 10/23/21 1703   10/02/21 2200  amoxicillin-clavulanate (AUGMENTIN) 500-125 MG per tablet 500 mg  Status:  Discontinued        1 tablet Oral Every 12 hours 10/02/21 1454 10/13/21 1151   09/30/21 2200  amoxicillin-clavulanate (AUGMENTIN) 875-125 MG per tablet 1 tablet  Status:  Discontinued        1 tablet Oral Every 12 hours 09/30/21 1803 10/02/21 1454   09/30/21 1545  Ampicillin-Sulbactam (UNASYN) 3 g in sodium chloride 0.9 % 100 mL IVPB        3 g 200 mL/hr over 30 Minutes Intravenous  Once 09/30/21 1530 09/30/21 1629              Family Communication/Anticipated D/C date and plan/Code Status   DVT prophylaxis: heparin injection 5,000 Units Start: 10/23/21 2330     Code Status: Full Code  Family Communication:  None Disposition Plan: Awaiting placement   Status is: Inpatient Remains inpatient appropriate because: Awaiting placement   Subjective:   No vomiting or diarrhea.  She still has pain and swelling in the left upper extremity but she has noticed some improvement.  She was on the phone with her sister.  She requested Metamucil every morning so she can have a regular bowel movement.  Objective:    Vitals:   11/17/21 1503 11/17/21 1504  11/17/21 1505 11/17/21 1551  BP:    134/67  Pulse: 66 67 66 73  Resp: 16 17 15 18   Temp:      TempSrc:      SpO2: 100% 100% 100% 99%  Weight:      Height:       No data found.   Intake/Output Summary (Last 24 hours) at 11/17/2021 1602 Last data filed at 11/17/2021 1441 Gross per 24 hour  Intake 243 ml  Output 1000 ml  Net -757 ml   Filed Weights   11/15/21 0500 11/15/21 0907 11/16/21 0500  Weight: 78.9 kg 75.1 kg 74.1 kg    Exam:  GEN: NAD SKIN: Warm and dry EYES: No pallor or icterus ENT: MMM CV: RRR PULM: CTA B ABD: soft, ND, NT, +BS CNS: AAO x 3, non focal EXT: Swelling, erythema and tenderness of left forearm.  Some tenderness of the left arm and hand.         Data Reviewed:   I have personally reviewed following labs and imaging studies:  Labs: Labs show the following:   Basic Metabolic Panel: Recent Labs  Lab 11/13/21 0945 11/15/21 0940 11/16/21 0844  NA 141 140 137  K 3.7 3.7 4.0  CL 106 103 102  CO2 28 27 28   GLUCOSE 113* 128* 111*  BUN 34* 28* 16  CREATININE 5.67* 5.86* 3.99*  CALCIUM 8.2* 8.6* 9.0  MG  --   --  1.9  PHOS 3.6 3.8 3.3   GFR Estimated Creatinine Clearance: 14.9 mL/min (A) (by C-G formula based on SCr of 3.99 mg/dL (H)). Liver Function Tests: Recent Labs  Lab 11/13/21 0945 11/15/21 0940  ALBUMIN 2.5* 2.7*   No results for input(s): "LIPASE", "AMYLASE" in the last 168 hours. No results for input(s): "AMMONIA" in the last 168 hours. Coagulation profile No results for input(s):  "INR", "PROTIME" in the last 168 hours.  CBC: Recent Labs  Lab 11/13/21 0945 11/15/21 0940  WBC 5.9 6.3  HGB 9.0* 9.3*  HCT 30.0* 31.1*  MCV 98.0 97.8  PLT 266 282   Cardiac Enzymes: No results for input(s): "CKTOTAL", "CKMB", "CKMBINDEX", "TROPONINI" in the last 168 hours. BNP (last 3 results) No results for input(s): "PROBNP" in the last 8760 hours. CBG: Recent Labs  Lab 11/14/21 0749 11/15/21 0740 11/16/21 0727 11/16/21 1705 11/17/21 0852  GLUCAP 96 88 98 101* 101*   D-Dimer: No results for input(s): "DDIMER" in the last 72 hours. Hgb A1c: No results for input(s): "HGBA1C" in the last 72 hours. Lipid Profile: No results for input(s): "CHOL", "HDL", "LDLCALC", "TRIG", "CHOLHDL", "LDLDIRECT" in the last 72 hours. Thyroid function studies: No results for input(s): "TSH", "T4TOTAL", "T3FREE", "THYROIDAB" in the last 72 hours.  Invalid input(s): "FREET3" Anemia work up: No results for input(s): "VITAMINB12", "FOLATE", "FERRITIN", "TIBC", "IRON", "RETICCTPCT" in the last 72 hours. Sepsis Labs: Recent Labs  Lab 11/13/21 0945 11/15/21 0940  WBC 5.9 6.3    Microbiology No results found for this or any previous visit (from the past 240 hour(s)).  Procedures and diagnostic studies:  No results found.             LOS: 25 days   [employer], advertising on [email or web address]. If 7PM-7AM, please contact night-coverage at [email or web address]     11/17/2021, 4:02 PM

## 2021-11-17 NOTE — Plan of Care (Signed)

## 2021-11-17 NOTE — Progress Notes (Signed)
[facility]  Dialysis Note   Subjective:   Patient seen and evaluated during dialysis   HEMODIALYSIS FLOWSHEET:  Blood Flow Rate (mL/min): 400 mL/min Arterial Pressure (mmHg): -190 mmHg Venous Pressure (mmHg): 120 mmHg Transmembrane Pressure (mmHg): 40 mmHg Ultrafiltration Rate (mL/min): 500 mL/min Dialysate Flow Rate (mL/min): 600 ml/min Conductivity: 13.9 Conductivity: 13.9 Dialysis Fluid Bolus: Normal Saline Bolus Amount (mL): 250 mL  Patient tearful when visited at the bedside She is frustrated with her extended LOS and delayed dialysis treatments. Reports nausea and diarrhea after each treatment. She voices frustrated with pain and inability to perforn ADLs independently. States that she currently does not have a good quality of life.   Objective:  Vital signs in last 24 hours:  Temp:  [97.4 F (36.3 C)-98.7 F (37.1 C)] 97.4 F (36.3 C) (07/07 1135) Pulse Rate:  [69-77] 70 (07/07 1145) Resp:  [18-23] 21 (07/07 1145) BP: (136-189)/(48-89) 189/69 (07/07 1145) SpO2:  [91 %-100 %] 100 % (07/07 1145)  Weight change:  Filed Weights   11/15/21 0500 11/15/21 0907 11/16/21 0500  Weight: 78.9 kg 75.1 kg 74.1 kg    Intake/Output: I/O last 3 completed shifts: In: 666 [P.O.:660; I.V.:6] Out: -    Intake/Output this shift:  No intake/output data recorded.  Physical Exam: General: NAD  Head: Normocephalic, atraumatic. Moist oral mucosal membranes  Eyes: Anicteric  Lungs:  Clear to auscultation, normal effort  Heart: Regular rate and rhythm  Abdomen:  Soft, nontender  Extremities: No peripheral edema.  Left upper extremity edema  Neurologic: Nonfocal, moving all four extremities  Skin: No lesions  Access: Right PermCath    Basic Metabolic Panel: Recent Labs  Lab 11/10/21 1243 11/13/21 0945 11/15/21 0940 11/16/21 0844  NA 137 141 140 137  K 4.1 3.7 3.7 4.0  CL 101 106 103 102  CO2 27 28 27 28   GLUCOSE 124* 113* 128* 111*  BUN 25* 34* 28* 16   CREATININE 4.78* 5.67* 5.86* 3.99*  CALCIUM 8.9 8.2* 8.6* 9.0  MG  --   --   --  1.9  PHOS 3.0 3.6 3.8 3.3     Liver Function Tests: Recent Labs  Lab 11/10/21 1243 11/13/21 0945 11/15/21 0940  ALBUMIN 2.9* 2.5* 2.7*    No results for input(s): "LIPASE", "AMYLASE" in the last 168 hours. No results for input(s): "AMMONIA" in the last 168 hours.  CBC: Recent Labs  Lab 11/10/21 1252 11/13/21 0945 11/15/21 0940  WBC 8.3 5.9 6.3  HGB 10.0* 9.0* 9.3*  HCT 33.0* 30.0* 31.1*  MCV 96.2 98.0 97.8  PLT 338 266 282     Cardiac Enzymes: No results for input(s): "CKTOTAL", "CKMB", "CKMBINDEX", "TROPONINI" in the last 168 hours.  BNP: Invalid input(s): "POCBNP"  CBG: Recent Labs  Lab 11/14/21 0749 11/15/21 0740 11/16/21 0727 11/16/21 1705 11/17/21 0852  GLUCAP 96 88 98 101* 101*     Microbiology: Results for orders placed or performed during the hospital encounter of 09/30/21  Resp Panel by RT-PCR (Flu A&B, Covid) Nasopharyngeal Swab     Status: None   Collection Time: 09/30/21  9:34 AM   Specimen: Nasopharyngeal Swab; Nasopharyngeal(NP) swabs in vial transport medium  Result Value Ref Range Status   SARS Coronavirus 2 by RT PCR NEGATIVE NEGATIVE Final    Comment: (NOTE) SARS-CoV-2 target nucleic acids are NOT DETECTED.  The SARS-CoV-2 RNA is generally detectable in upper respiratory specimens during the acute phase of infection. The lowest concentration of SARS-CoV-2 viral copies this assay  can detect is 138 copies/mL. A negative result does not preclude SARS-Cov-2 infection and should not be used as the sole basis for treatment or other patient management decisions. A negative result may occur with  improper specimen collection/handling, submission of specimen other than nasopharyngeal swab, presence of viral mutation(s) within the areas targeted by this assay, and inadequate number of viral copies(<138 copies/mL). A negative result must be combined  with clinical observations, patient history, and epidemiological information. The expected result is Negative.  Fact Sheet for Patients:  EntrepreneurPulse.com.au  Fact Sheet for Healthcare Providers:  IncredibleEmployment.be  This test is no t yet approved or cleared by the [place] FDA and  has been authorized for detection and/or diagnosis of SARS-CoV-2 by FDA under an Emergency Use Authorization (EUA). This EUA will remain  in effect (meaning this test can be used) for the duration of the COVID-19 declaration under Section 564(b)(1) of the Act, 21 U.S.C.section 360bbb-3(b)(1), unless the authorization is terminated  or revoked sooner.       Influenza A by PCR NEGATIVE NEGATIVE Final   Influenza B by PCR NEGATIVE NEGATIVE Final    Comment: (NOTE) The Xpert Xpress SARS-CoV-2/FLU/RSV plus assay is intended as an aid in the diagnosis of influenza from Nasopharyngeal swab specimens and should not be used as a sole basis for treatment. Nasal washings and aspirates are unacceptable for Xpert Xpress SARS-CoV-2/FLU/RSV testing.  Fact Sheet for Patients: EntrepreneurPulse.com.au  Fact Sheet for Healthcare Providers: IncredibleEmployment.be  This test is not yet approved or cleared by the [place] FDA and has been authorized for detection and/or diagnosis of SARS-CoV-2 by FDA under an Emergency Use Authorization (EUA). This EUA will remain in effect (meaning this test can be used) for the duration of the COVID-19 declaration under Section 564(b)(1) of the Act, 21 U.S.C. section 360bbb-3(b)(1), unless the authorization is terminated or revoked.  Performed at [facility], [address] [place], [place] [address]   SARS Coronavirus 2 by RT PCR (hospital order, performed in [facility] hospital lab) *cepheid single result test* Anterior Nasal Swab     Status: None   Collection Time:  10/23/21  4:06 PM   Specimen: Anterior Nasal Swab  Result Value Ref Range Status   SARS Coronavirus 2 by RT PCR NEGATIVE NEGATIVE Final    Comment: (NOTE) SARS-CoV-2 target nucleic acids are NOT DETECTED.  The SARS-CoV-2 RNA is generally detectable in upper and lower respiratory specimens during the acute phase of infection. The lowest concentration of SARS-CoV-2 viral copies this assay can detect is 250 copies / mL. A negative result does not preclude SARS-CoV-2 infection and should not be used as the sole basis for treatment or other patient management decisions.  A negative result may occur with improper specimen collection / handling, submission of specimen other than nasopharyngeal swab, presence of viral mutation(s) within the areas targeted by this assay, and inadequate number of viral copies (<250 copies / mL). A negative result must be combined with clinical observations, patient history, and epidemiological information.  Fact Sheet for Patients:   https://www.patel.info/  Fact Sheet for Healthcare Providers: https://hall.com/  This test is not yet approved or  cleared by the [place] FDA and has been authorized for detection and/or diagnosis of SARS-CoV-2 by FDA under an Emergency Use Authorization (EUA).  This EUA will remain in effect (meaning this test can be used) for the duration of the COVID-19 declaration under Section 564(b)(1) of the Act, 21 U.S.C. section 360bbb-3(b)(1), unless the  authorization is terminated or revoked sooner.  Performed at [facility], [address] [place], [place] [address]   Blood culture (routine x 2)     Status: None   Collection Time: 10/23/21  4:06 PM   Specimen: BLOOD  Result Value Ref Range Status   Specimen Description BLOOD BLOOD RIGHT HAND  Final   Special Requests   Final    BOTTLES DRAWN AEROBIC AND ANAEROBIC Blood Culture adequate volume   Culture   Final    NO  GROWTH 5 DAYS Performed at [facility], [address] [place], [place] [address]    Report Status 10/28/2021 FINAL  Final  Culture, blood (Routine X 2) w Reflex to ID Panel     Status: None   Collection Time: 10/24/21 12:06 AM   Specimen: BLOOD  Result Value Ref Range Status   Specimen Description BLOOD LEFT HAND  Final   Special Requests   Final    BOTTLES DRAWN AEROBIC AND ANAEROBIC Blood Culture adequate volume   Culture   Final    NO GROWTH 5 DAYS Performed at [facility], [address] [place], [place] [address]    Report Status 10/29/2021 FINAL  Final  MRSA Next Gen by PCR, Nasal     Status: None   Collection Time: 10/24/21  2:24 PM   Specimen: Nasal Mucosa; Nasal Swab  Result Value Ref Range Status   MRSA by PCR Next Gen NOT DETECTED NOT DETECTED Final    Comment: (NOTE) The GeneXpert MRSA Assay (FDA approved for NASAL specimens only), is one component of a comprehensive MRSA colonization surveillance program. It is not intended to diagnose MRSA infection nor to guide or monitor treatment for MRSA infections. Test performance is not FDA approved in patients less than 73 years old. Performed at [facility], [place], [place] [address]   Respiratory (~20 pathogens) panel by PCR     Status: None   Collection Time: 10/24/21  2:24 PM   Specimen: Nasopharyngeal Swab; Respiratory  Result Value Ref Range Status   Adenovirus NOT DETECTED NOT DETECTED Final   Coronavirus 229E NOT DETECTED NOT DETECTED Final    Comment: (NOTE) The Coronavirus on the Respiratory Panel, DOES NOT test for the novel  Coronavirus (2019 nCoV)    Coronavirus HKU1 NOT DETECTED NOT DETECTED Final   Coronavirus NL63 NOT DETECTED NOT DETECTED Final   Coronavirus OC43 NOT DETECTED NOT DETECTED Final   Metapneumovirus NOT DETECTED NOT DETECTED Final   Rhinovirus / Enterovirus NOT DETECTED NOT DETECTED Final   Influenza A NOT DETECTED NOT DETECTED Final    Influenza B NOT DETECTED NOT DETECTED Final   Parainfluenza Virus 1 NOT DETECTED NOT DETECTED Final   Parainfluenza Virus 2 NOT DETECTED NOT DETECTED Final   Parainfluenza Virus 3 NOT DETECTED NOT DETECTED Final   Parainfluenza Virus 4 NOT DETECTED NOT DETECTED Final   Respiratory Syncytial Virus NOT DETECTED NOT DETECTED Final   Bordetella pertussis NOT DETECTED NOT DETECTED Final   Bordetella Parapertussis NOT DETECTED NOT DETECTED Final   Chlamydophila pneumoniae NOT DETECTED NOT DETECTED Final   Mycoplasma pneumoniae NOT DETECTED NOT DETECTED Final    Comment: Performed at [facility], [place]. [address] [place], [place] [address]    Coagulation Studies: No results for input(s): "LABPROT", "INR" in the last 72 hours.  Urinalysis: No results for input(s): "COLORURINE", "LABSPEC", "PHURINE", "GLUCOSEU", "HGBUR", "BILIRUBINUR", "KETONESUR", "PROTEINUR", "UROBILINOGEN", "NITRITE", "LEUKOCYTESUR" in the last 32  hours.  Invalid input(s): "APPERANCEUR"    Imaging: No results found.   Medications:      ondansetron       ondansetron       (feeding supplement) PROSource Plus  30 mL Oral BID BM   acidophilus  2 capsule Oral TID   carvedilol  12.5 mg Oral QPM   cephALEXin  500 mg Oral Q12H   Chlorhexidine Gluconate Cloth  6 each Topical Q0600   citalopram  10 mg Oral Daily   doxycycline  100 mg Oral Q12H   epoetin (EPOGEN/PROCRIT) injection  4,000 Units Intravenous Q M,W,F-HD   furosemide  40 mg Oral Daily   heparin  5,000 Units Subcutaneous Q12H   heparin sodium (porcine)       multivitamin  1 tablet Oral QHS   pantoprazole  40 mg Oral BID   sodium chloride flush  3 mL Intravenous Q12H   ondansetron, ondansetron, acetaminophen **OR** acetaminophen, albuterol, guaiFENesin, heparin sodium (porcine), loperamide, ondansetron (ZOFRAN) IV, oxyCODONE-acetaminophen, senna-docusate, tiZANidine, topiramate  Assessment/ Plan:  Katie [name] is a 66 y.o.  female with past  medical history of ESRD with hemodialysis TTS, diabetes mellitus, hypertension to the hospital seeking long term placement. She was recently admitted and received antibiotics for a dog bite.  She was discharged with a prescription for home antibiotics.  She states her caregivers were unable to retrieve antibiotics.  She states that there was an argument and that she is here seeking long-term placement.  Principal Problem:   Adjustment disorder with mixed anxiety and depressed mood Active Problems:   Hypertension   Type 2 diabetes mellitus (HCC)   ESRD (end stage renal disease) ([place])   Financial difficulty   Anemia in chronic kidney disease   Sepsis ([place])   Cellulitis   Bent bone fracture of ulna   Hospital acquired PNA   Fecal impaction (HCC)   Aortic stenosis   Left arm cellulitis   [facility]/MWF/right PermCath/83.5 kg  End Stage Renal Disease on hemodialysis:  Receiving dialysis today, UF goal 1L as tolerated. Will adjust dry weight closer to discharge.  This provider listened to patient concerns about late treatments and illness she feels after treatment. Discussed with patient that dialysis schedule in hospital is not set and adjust constantly to the needs of critical patients. Refusing treatment is her right, but an early chair time can not be guaranteed in an acute setting. Will order IV Benadryl 50mg  and Zofran 4mg  pre-treatment to manage nausea and vomiting after treatment. This may be a reaction to the fluids used during treatment and some patient suffer this effect.  Next treatment scheduled for Monday.  Renal navigator continues to seek outpatient  clinic that will accommodate rehab chair request.   2. Hypertension with chronic kidney disease:   Patient remains on carvedilol and furosemide only.  Blood pressure  135/58 in dialysis   BP (!) 189/69   Pulse 70   Temp (!) 97.4 F (36.3 C) (Oral)   Resp (!) 21   Ht 5\' 6"  (1.676 m)   Wt 74.1 kg   SpO2 100%    BMI 26.37 kg/m   3. Anemia of chronic kidney disease/ kidney injury/chronic disease/acute blood loss:   Lab Results  Component Value Date   HGB 9.3 (L) 11/15/2021  Hemoglobin at goal Continue EPO with dialysis treatments.  4. Secondary Hyperparathyroidism:     Lab Results  Component Value Date   PTH 70 (H) 05/12/2020   CALCIUM 9.0  11/16/2021   PHOS 3.3 11/16/2021   Calcium and phosphorus are within goal  5.  Severe constipation Managed with stool softeners.    LOS: [facility] 7/7/202312:23 PM

## 2021-11-18 DIAGNOSIS — L03114 Cellulitis of left upper limb: Secondary | ICD-10-CM | POA: Diagnosis not present

## 2021-11-18 DIAGNOSIS — N186 End stage renal disease: Secondary | ICD-10-CM | POA: Diagnosis not present

## 2021-11-18 LAB — GLUCOSE, CAPILLARY: Glucose-Capillary: 112 mg/dL — ABNORMAL HIGH (ref 70–99)

## 2021-11-18 MED ORDER — ONDANSETRON 4 MG PO TBDP
4.0000 mg | ORAL_TABLET | Freq: Three times a day (TID) | ORAL | Status: DC | PRN
Start: 2021-11-18 — End: 2021-11-27
  Administered 2021-11-18 – 2021-11-25 (×12): 4 mg via ORAL
  Filled 2021-11-18 (×12): qty 1

## 2021-11-18 MED ORDER — TRAZODONE HCL 50 MG PO TABS
50.0000 mg | ORAL_TABLET | Freq: Every day | ORAL | Status: DC
  Administered 2021-11-18 – 2021-11-26 (×9): 50 mg via ORAL
  Filled 2021-11-18 (×9): qty 1

## 2021-11-18 NOTE — Progress Notes (Signed)
[facility]  Dialysis Note   Subjective:   Patient was seen today on first floor. Patient resting comfortably on the bed  Objective:  Vital signs in last 24 hours:  Temp:  [97.7 F (36.5 C)-98.8 F (37.1 C)] 98.6 F (37 C) (07/08 1651) Pulse Rate:  [62-73] 73 (07/08 1651) Resp:  [16-18] 17 (07/08 1651) BP: (106-141)/(51-94) 130/51 (07/08 1651) SpO2:  [95 %-100 %] 99 % (07/08 1651)  Weight change:  Filed Weights   11/15/21 0907 11/16/21 0500 11/17/21 1450  Weight: 75.1 kg 74.1 kg 73.6 kg    Intake/Output: I/O last 3 completed shifts: In: 6 [I.V.:6] Out: 1000 [Other:1000]   Intake/Output this shift:  Total I/O In: 243 [P.O.:240; I.V.:3] Out: -   Physical Exam: General: NAD  Head: Normocephalic, atraumatic. Moist oral mucosal membranes  Eyes: Anicteric  Lungs:  Clear to auscultation, normal effort  Heart: Regular rate and rhythm  Abdomen:  Soft, nontender  Extremities: No peripheral edema.  Left upper extremity edema  Neurologic: Nonfocal, moving all four extremities  Skin: No lesions  Access: Right PermCath    Basic Metabolic Panel: Recent Labs  Lab 11/13/21 0945 11/15/21 0940 11/16/21 0844  NA 141 140 137  K 3.7 3.7 4.0  CL 106 103 102  CO2 28 27 28   GLUCOSE 113* 128* 111*  BUN 34* 28* 16  CREATININE 5.67* 5.86* 3.99*  CALCIUM 8.2* 8.6* 9.0  MG  --   --  1.9  PHOS 3.6 3.8 3.3    Liver Function Tests: Recent Labs  Lab 11/13/21 0945 11/15/21 0940  ALBUMIN 2.5* 2.7*   No results for input(s): "LIPASE", "AMYLASE" in the last 168 hours. No results for input(s): "AMMONIA" in the last 168 hours.  CBC: Recent Labs  Lab 11/13/21 0945 11/15/21 0940  WBC 5.9 6.3  HGB 9.0* 9.3*  HCT 30.0* 31.1*  MCV 98.0 97.8  PLT 266 282    Cardiac Enzymes: No results for input(s): "CKTOTAL", "CKMB", "CKMBINDEX", "TROPONINI" in the last 168 hours.  BNP: Invalid input(s): "POCBNP"  CBG: Recent Labs  Lab 11/15/21 0740 11/16/21 0727  11/16/21 1705 11/17/21 0852 11/18/21 1654  GLUCAP 88 98 101* 101* 112*    Microbiology: Results for orders placed or performed during the hospital encounter of 09/30/21  Resp Panel by RT-PCR (Flu A&B, Covid) Nasopharyngeal Swab     Status: None   Collection Time: 09/30/21  9:34 AM   Specimen: Nasopharyngeal Swab; Nasopharyngeal(NP) swabs in vial transport medium  Result Value Ref Range Status   SARS Coronavirus 2 by RT PCR NEGATIVE NEGATIVE Final    Comment: (NOTE) SARS-CoV-2 target nucleic acids are NOT DETECTED.  The SARS-CoV-2 RNA is generally detectable in upper respiratory specimens during the acute phase of infection. The lowest concentration of SARS-CoV-2 viral copies this assay can detect is 138 copies/mL. A negative result does not preclude SARS-Cov-2 infection and should not be used as the sole basis for treatment or other patient management decisions. A negative result may occur with  improper specimen collection/handling, submission of specimen other than nasopharyngeal swab, presence of viral mutation(s) within the areas targeted by this assay, and inadequate number of viral copies(<138 copies/mL). A negative result must be combined with clinical observations, patient history, and epidemiological information. The expected result is Negative.  Fact Sheet for Patients:  EntrepreneurPulse.com.au  Fact Sheet for Healthcare Providers:  IncredibleEmployment.be  This test is no t yet approved or cleared by the [place] and  has been authorized  for detection and/or diagnosis of SARS-CoV-2 by FDA under an Emergency Use Authorization (EUA). This EUA will remain  in effect (meaning this test can be used) for the duration of the COVID-19 declaration under Section 564(b)(1) of the Act, 21 U.S.C.section 360bbb-3(b)(1), unless the authorization is terminated  or revoked sooner.       Influenza A by PCR NEGATIVE NEGATIVE Final    Influenza B by PCR NEGATIVE NEGATIVE Final    Comment: (NOTE) The Xpert Xpress SARS-CoV-2/FLU/RSV plus assay is intended as an aid in the diagnosis of influenza from Nasopharyngeal swab specimens and should not be used as a sole basis for treatment. Nasal washings and aspirates are unacceptable for Xpert Xpress SARS-CoV-2/FLU/RSV testing.  Fact Sheet for Patients: EntrepreneurPulse.com.au  Fact Sheet for Healthcare Providers: IncredibleEmployment.be  This test is not yet approved or cleared by the [place] FDA and has been authorized for detection and/or diagnosis of SARS-CoV-2 by FDA under an Emergency Use Authorization (EUA). This EUA will remain in effect (meaning this test can be used) for the duration of the COVID-19 declaration under Section 564(b)(1) of the Act, 21 U.S.C. section 360bbb-3(b)(1), unless the authorization is terminated or revoked.  Performed at [facility], [address] [place], [place] [address]   SARS Coronavirus 2 by RT PCR (hospital order, performed in [place] hospital lab) *cepheid single result test* Anterior Nasal Swab     Status: None   Collection Time: 10/23/21  4:06 PM   Specimen: Anterior Nasal Swab  Result Value Ref Range Status   SARS Coronavirus 2 by RT PCR NEGATIVE NEGATIVE Final    Comment: (NOTE) SARS-CoV-2 target nucleic acids are NOT DETECTED.  The SARS-CoV-2 RNA is generally detectable in upper and lower respiratory specimens during the acute phase of infection. The lowest concentration of SARS-CoV-2 viral copies this assay can detect is 250 copies / mL. A negative result does not preclude SARS-CoV-2 infection and should not be used as the sole basis for treatment or other patient management decisions.  A negative result may occur with improper specimen collection / handling, submission of specimen other than nasopharyngeal swab, presence of viral mutation(s) within the areas  targeted by this assay, and inadequate number of viral copies (<250 copies / mL). A negative result must be combined with clinical observations, patient history, and epidemiological information.  Fact Sheet for Patients:   https://www.patel.info/  Fact Sheet for Healthcare Providers: https://hall.com/  This test is not yet approved or  cleared by the [place] FDA and has been authorized for detection and/or diagnosis of SARS-CoV-2 by FDA under an Emergency Use Authorization (EUA).  This EUA will remain in effect (meaning this test can be used) for the duration of the COVID-19 declaration under Section 564(b)(1) of the Act, 21 U.S.C. section 360bbb-3(b)(1), unless the authorization is terminated or revoked sooner.  Performed at [facility], [address] [place], [place] [address]   Blood culture (routine x 2)     Status: None   Collection Time: 10/23/21  4:06 PM   Specimen: BLOOD  Result Value Ref Range Status   Specimen Description BLOOD BLOOD RIGHT HAND  Final   Special Requests   Final    BOTTLES DRAWN AEROBIC AND ANAEROBIC Blood Culture adequate volume   Culture   Final    NO GROWTH 5 DAYS Performed at [facility], [address], [place], [place] [address]    Report Status 10/28/2021 FINAL  Final  Culture, blood (Routine X  2) w Reflex to ID Panel     Status: None   Collection Time: 10/24/21 12:06 AM   Specimen: BLOOD  Result Value Ref Range Status   Specimen Description BLOOD LEFT HAND  Final   Special Requests   Final    BOTTLES DRAWN AEROBIC AND ANAEROBIC Blood Culture adequate volume   Culture   Final    NO GROWTH 5 DAYS Performed at [facility], [address] [place], [place] [address]    Report Status 10/29/2021 FINAL  Final  MRSA Next Gen by PCR, Nasal     Status: None   Collection Time: 10/24/21  2:24 PM   Specimen: Nasal Mucosa; Nasal Swab  Result Value Ref Range Status    MRSA by PCR Next Gen NOT DETECTED NOT DETECTED Final    Comment: (NOTE) The GeneXpert MRSA Assay (FDA approved for NASAL specimens only), is one component of a comprehensive MRSA colonization surveillance program. It is not intended to diagnose MRSA infection nor to guide or monitor treatment for MRSA infections. Test performance is not FDA approved in patients less than 22 years old. Performed at [facility], [place], [place] [address]   Respiratory (~20 pathogens) panel by PCR     Status: None   Collection Time: 10/24/21  2:24 PM   Specimen: Nasopharyngeal Swab; Respiratory  Result Value Ref Range Status   Adenovirus NOT DETECTED NOT DETECTED Final   Coronavirus 229E NOT DETECTED NOT DETECTED Final    Comment: (NOTE) The Coronavirus on the Respiratory Panel, DOES NOT test for the novel  Coronavirus (2019 nCoV)    Coronavirus HKU1 NOT DETECTED NOT DETECTED Final   Coronavirus NL63 NOT DETECTED NOT DETECTED Final   Coronavirus OC43 NOT DETECTED NOT DETECTED Final   Metapneumovirus NOT DETECTED NOT DETECTED Final   Rhinovirus / Enterovirus NOT DETECTED NOT DETECTED Final   Influenza A NOT DETECTED NOT DETECTED Final   Influenza B NOT DETECTED NOT DETECTED Final   Parainfluenza Virus 1 NOT DETECTED NOT DETECTED Final   Parainfluenza Virus 2 NOT DETECTED NOT DETECTED Final   Parainfluenza Virus 3 NOT DETECTED NOT DETECTED Final   Parainfluenza Virus 4 NOT DETECTED NOT DETECTED Final   Respiratory Syncytial Virus NOT DETECTED NOT DETECTED Final   Bordetella pertussis NOT DETECTED NOT DETECTED Final   Bordetella Parapertussis NOT DETECTED NOT DETECTED Final   Chlamydophila pneumoniae NOT DETECTED NOT DETECTED Final   Mycoplasma pneumoniae NOT DETECTED NOT DETECTED Final    Comment: Performed at [facility], [place], [place], [place] [address]    Coagulation Studies: No results for input(s): "LABPROT", "INR" in the last 72  hours.  Urinalysis: No results for input(s): "COLORURINE", "LABSPEC", "PHURINE", "GLUCOSEU", "HGBUR", "BILIRUBINUR", "KETONESUR", "PROTEINUR", "UROBILINOGEN", "NITRITE", "LEUKOCYTESUR" in the last 72 hours.  Invalid input(s): "APPERANCEUR"    Imaging: No results found.   Medications:      (feeding supplement) PROSource Plus  30 mL Oral BID BM   acidophilus  2 capsule Oral TID   carvedilol  12.5 mg Oral QPM   cephALEXin  500 mg Oral Q12H   Chlorhexidine Gluconate Cloth  6 each Topical Q0600   citalopram  10 mg Oral Daily   doxycycline  100 mg Oral Q12H   epoetin (EPOGEN/PROCRIT) injection  4,000 Units Intravenous Q M,W,F-HD   furosemide  40 mg Oral Daily   heparin  5,000 Units Subcutaneous Q12H   multivitamin  1 tablet Oral QHS   pantoprazole  40 mg Oral BID   psyllium  1 packet Oral q AM   sodium chloride flush  3 mL Intravenous Q12H   traZODone  50 mg Oral QHS   acetaminophen **OR** acetaminophen, albuterol, guaiFENesin, loperamide, ondansetron, oxyCODONE-acetaminophen, senna-docusate, tiZANidine, topiramate  Assessment/ Plan:  [name] is a 66 y.o.  female with past medical history of ESRD with hemodialysis TTS, diabetes mellitus, hypertension to the hospital seeking long term placement. She was recently admitted and received antibiotics for a dog bite.  She was discharged with a prescription for home antibiotics.  She states her caregivers were unable to retrieve antibiotics.  She states that there was an argument and that she is here seeking long-term placement.  Principal Problem:   Adjustment disorder with mixed anxiety and depressed mood Active Problems:   Hypertension   Type 2 diabetes mellitus (HCC)   ESRD (end stage renal disease) ([place])   Financial difficulty   Anemia in chronic kidney disease   Sepsis ([place])   Cellulitis   Bent bone fracture of ulna   Hospital acquired PNA   Fecal impaction (HCC)   Aortic stenosis   Left arm cellulitis   [facility]  [facility]/MWF/right PermCath/83.5 kg  End Stage Renal Disease on hemodialysis:  Patient was last dialyzed yesterday No need for renal placement therapy today  Next treatment scheduled for Monday.  Renal navigator continues to seek outpatient  clinic that will accommodate rehab chair request.   2. Hypertension with chronic kidney disease:   Patient remains on carvedilol and furosemide only.     BP (!) 130/51 (BP Location: Right Arm)   Pulse 73   Temp 98.6 F (37 C)   Resp 17   Ht 5\' 6"  (1.676 m)   Wt 73.6 kg   SpO2 99%   BMI 26.19 kg/m   3. Anemia of chronic kidney disease/ kidney injury/chronic disease/acute blood loss:   Lab Results  Component Value Date   HGB 9.3 (L) 11/15/2021  Hemoglobin at goal Continue EPO with dialysis treatments.  4. Secondary Hyperparathyroidism:     Lab Results  Component Value Date   PTH 70 (H) 05/12/2020   CALCIUM 9.0 11/16/2021   PHOS 3.3 11/16/2021   Calcium and phosphorus are within goal  5.  Severe constipation Managed with stool softeners.    LOS: [facility] 7/8/20235:04 PM

## 2021-11-18 NOTE — Progress Notes (Signed)
Physical Therapy Treatment Patient Details Name: Katie [name] MRN: [id number] DOB: 1956/03/21 Today's Date: 11/18/2021   History of Present Illness Pt is a 66 y.o. female presenting to hospital 5/20 with concerns for worsening L hand pain d/t animal bite (pt recently admitted 5/13-5/17 for same reason). Pt's friend dropped her off at dialysis and refused to do any more (pt now homeless).  PMH includes ESRD on HD TTS, DM, htn, orthostatic hypotension, and h/o mandible surgery.    PT Comments    Pt seen for PT tx with pt agreeable & reporting need to void. Pt reports she cannot use toilet in bathroom as it is broken. Pt completes supine<>sit with mod I & STS and stand pivot bed<>BSC without AD & supervision. Pt completes peri hygiene after continent void with supervision for standing balance. Pt does endorse slight dizziness with positional changes but demonstrates/verbalizes good awareness of need to sit before standing & keep her head up vs looking down. After toileting pt returns to sitting EOB & PT educated pt on importance of OOB mobility & sitting in recliner as much as possible with pt reporting she tries to sit in recliner 4 hours/day but it's uncomfortable on her back/buttocks. Pt then reports discomfort sitting EOB & elects to return semi fowler in bed & request PT assist with setting up meal tray. Will continue to follow pt acutely to address balance, endurance, and gait as able.     Recommendations for follow up therapy are one component of a multi-disciplinary discharge planning process, led by the attending physician.  Recommendations may be updated based on patient status, additional functional criteria and insurance authorization.  Follow Up Recommendations  Skilled nursing-short term rehab (<3 hours/day) Can patient physically be transported by private vehicle: Yes   Assistance Recommended at Discharge Frequent or constant Supervision/Assistance  Patient can return home with the  following A little help with walking and/or transfers;A little help with bathing/dressing/bathroom;Assistance with cooking/housework;Assist for transportation;Help with stairs or ramp for entrance;Direct supervision/assist for medications management   Equipment Recommendations   (TBD in next venue)    Recommendations for Other Services       Precautions / Restrictions Precautions Precautions: Fall Precaution Comments: R IJ permcath; Restrictions Weight Bearing Restrictions: No     Mobility  Bed Mobility Overal bed mobility: Needs Assistance Bed Mobility: Supine to Sit, Sit to Supine     Supine to sit: Modified independent (Device/Increase time), HOB elevated Sit to supine: Modified independent (Device/Increase time), HOB elevated        Transfers Overall transfer level: Needs assistance Equipment used: None Transfers: Sit to/from Stand Sit to Stand: Supervision   Step pivot transfers: Supervision            Ambulation/Gait                   Stairs             Wheelchair Mobility    Modified Rankin (Stroke Patients Only)       Balance Overall balance assessment: Needs assistance Sitting-balance support: Feet supported, Bilateral upper extremity supported Sitting balance-Leahy Scale: Good     Standing balance support: During functional activity, No upper extremity supported Standing balance-Leahy Scale: Good Standing balance comment: performs peri hygiene in standing with supervision without BUE support without LOB                            Cognition Arousal/Alertness: Awake/alert Behavior During  Therapy: WFL for tasks assessed/performed, Flat affect Overall Cognitive Status: Within Functional Limits for tasks assessed                                          Exercises      General Comments General comments (skin integrity, edema, etc.): SpO2 >/= 90% on room air throughout session despite occasional  c/o of slight SOB, PT educated pt on pursed lip breathing. Pt with continent void on BSC (did not use toilet in bathroom as pt reports toilet is broken.)      Pertinent Vitals/Pain Pain Assessment Pain Assessment: Faces Faces Pain Scale: Hurts a little bit Pain Location: BLE feet Pain Descriptors / Indicators: Discomfort Pain Intervention(s): Repositioned    Home Living                          Prior Function            PT Goals (current goals can now be found in the care plan section) Acute Rehab PT Goals Patient Stated Goal: none stated PT Goal Formulation: With patient Time For Goal Achievement: 11/21/21 Potential to Achieve Goals: Fair Progress towards PT goals: Progressing toward goals    Frequency    Min 2X/week      PT Plan Current plan remains appropriate    Co-evaluation              AM-PAC PT "6 Clicks" Mobility   Outcome Measure  Help needed turning from your back to your side while in a flat bed without using bedrails?: None Help needed moving from lying on your back to sitting on the side of a flat bed without using bedrails?: None Help needed moving to and from a bed to a chair (including a wheelchair)?: A Little Help needed standing up from a chair using your arms (e.g., wheelchair or bedside chair)?: A Little Help needed to walk in hospital room?: A Little Help needed climbing 3-5 steps with a railing? : A Little 6 Click Score: 20    End of Session   Activity Tolerance: Patient tolerated treatment well Patient left: in bed;with call bell/phone within reach (set up with meal tray 2/2 impaired vision)   PT Visit Diagnosis: Other abnormalities of gait and mobility (R26.89);Unsteadiness on feet (R26.81);Muscle weakness (generalized) (M62.81);History of falling (Z91.81)     Time: 0947-0962 PT Time Calculation (min) (ACUTE ONLY): 11 min  Charges:  $Therapeutic Activity: 8-22 mins                     Lavone Nian, PT,  DPT 11/18/21, 3:00 PM   Waunita Schooner 11/18/2021, 2:58 PM

## 2021-11-18 NOTE — Progress Notes (Addendum)
Progress Note    Katie [name]  VFI:[id number] DOB: [birth year]  DOA: 09/30/2021 PCP: Pcp, No      Brief Narrative:    Medical records reviewed and are as summarized below:  Katie [name] is a 66 y.o. female with medical history significant of ESRD, DM II, homelessness. Had been admitted 05/13-05/17 for sepsis d/t cellulits from animal bite to arm, discharged on po abx and was unable to fill the Rx, was kicked out of where she was living, no way to get HD.  Patient has been in the ED since arriving the hospital.  She was admitted to the hospital again on 10/23/2021 as patient developed cough short of breath and fever.  Chest x-ray showed pneumonia.  Patient was diagnosed with sepsis and pneumonia, was started on cefepime and vancomycin. Had MRI performed on 6/14 did not show any osteomyelitis. Patient is currently homeless, has a severe weakness.  No discharge option at this time. 6/15.  Patient has no nausea, KUB showed fecal impaction with severe dilated colon, patient was started on senna as well as scheduled lactulose.  CT chest also showed dense right lower lobe consolidation consistent with aspiration pneumonia.  CT of the left shoulder did not show any osteomyelitis or septic arthritis. Fecal impaction resolved after Nulytely on 6/17. Patient condition has stabilized, pending nursing home placement. 6/29.  Patient had a significant left upper extremity swelling, duplex ultrasound did not show DVT.  She was started on doxycycline and Keflex for cellulitis of the left upper extremity.      Assessment/Plan:   Principal Problem:   Adjustment disorder with mixed anxiety and depressed mood Active Problems:   Cellulitis   Hypertension   Type 2 diabetes mellitus (HCC)   ESRD (end stage renal disease) (HCC)   Financial difficulty   Anemia in chronic kidney disease   Sepsis ([place])   Bent bone fracture of ulna   Hospital acquired PNA   Fecal impaction (HCC)   Aortic stenosis    Left arm cellulitis   Nutrition Problem: Increased nutrient needs Etiology: catabolic illness (ESRD on HD)  Signs/Symptoms: estimated needs   Body mass index is 26.19 kg/m.  Left upper extremity cellulitis: Plan to complete 10-day course of doxycycline and Keflex today.  Continue probiotics because of recent diarrhea.    Vomiting and diarrhea: Improved.  Amitiza and lactulose were discontinued on 11/15/2021.   ESRD on HD: She is on MWF schedule.  Follow-up with nephrologist for hemodialysis.  Recent sepsis from pneumonia: Completed antibiotics for that  Constipation, fecal impaction, hypokalemia: Improved.  Patient requested Metamucil every morning for regular bowel movement.  Other comorbidities include mild aortic stenosis, probable depression, type II DM with occasional hypoglycemia, anemia of chronic kidney disease  Trazodone as needed for sleep  Diet Order             Diet renal with fluid restriction Fluid restriction: 1200 mL Fluid; Room service appropriate? Yes; Fluid consistency: Thin  Diet effective now                            Consultants: Psychiatrist, nephrologist, gastroenterologist, orthopedic surgeon, infectious disease  Procedures: None    Medications:    (feeding supplement) PROSource Plus  30 mL Oral BID BM   acidophilus  2 capsule Oral TID   carvedilol  12.5 mg Oral QPM   cephALEXin  500 mg Oral Q12H   Chlorhexidine Gluconate Cloth  6  each Topical Q0600   citalopram  10 mg Oral Daily   doxycycline  100 mg Oral Q12H   epoetin (EPOGEN/PROCRIT) injection  4,000 Units Intravenous Q M,W,F-HD   furosemide  40 mg Oral Daily   heparin  5,000 Units Subcutaneous Q12H   multivitamin  1 tablet Oral QHS   pantoprazole  40 mg Oral BID   psyllium  1 packet Oral q AM   sodium chloride flush  3 mL Intravenous Q12H   traZODone  50 mg Oral QHS   Continuous Infusions:   Anti-infectives (From admission, onward)    Start     Dose/Rate Route  Frequency Ordered Stop   11/09/21 1215  doxycycline (VIBRA-TABS) tablet 100 mg        100 mg Oral Every 12 hours 11/09/21 1122 11/19/21 0959   11/09/21 1215  cephALEXin (KEFLEX) capsule 500 mg        500 mg Oral Every 12 hours 11/09/21 1123 11/19/21 0959   10/24/21 1200  vancomycin (VANCOCIN) IVPB 1000 mg/200 mL premix  Status:  Discontinued        1,000 mg 200 mL/hr over 60 Minutes Intravenous Every T-Th-Sa (Hemodialysis) 10/23/21 2017 10/27/21 1303   10/24/21 1200  ceFEPIme (MAXIPIME) 2 g in sodium chloride 0.9 % 100 mL IVPB        2 g 200 mL/hr over 30 Minutes Intravenous Every T-Th-Sa (Hemodialysis) 10/23/21 2017 10/29/21 2359   10/23/21 1615  vancomycin (VANCOREADY) IVPB 1750 mg/350 mL        1,750 mg 175 mL/hr over 120 Minutes Intravenous  Once 10/23/21 1614 10/23/21 1906   10/23/21 1530  vancomycin (VANCOCIN) IVPB 1000 mg/200 mL premix  Status:  Discontinued        1,000 mg 200 mL/hr over 60 Minutes Intravenous  Once 10/23/21 1526 10/23/21 1614   10/23/21 1530  ceFEPIme (MAXIPIME) 2 g in sodium chloride 0.9 % 100 mL IVPB        2 g 200 mL/hr over 30 Minutes Intravenous  Once 10/23/21 1526 10/23/21 1703   10/02/21 2200  amoxicillin-clavulanate (AUGMENTIN) 500-125 MG per tablet 500 mg  Status:  Discontinued        1 tablet Oral Every 12 hours 10/02/21 1454 10/13/21 1151   09/30/21 2200  amoxicillin-clavulanate (AUGMENTIN) 875-125 MG per tablet 1 tablet  Status:  Discontinued        1 tablet Oral Every 12 hours 09/30/21 1803 10/02/21 1454   09/30/21 1545  Ampicillin-Sulbactam (UNASYN) 3 g in sodium chloride 0.9 % 100 mL IVPB        3 g 200 mL/hr over 30 Minutes Intravenous  Once 09/30/21 1530 09/30/21 1629              Family Communication/Anticipated D/C date and plan/Code Status   DVT prophylaxis: heparin injection 5,000 Units Start: 10/23/21 2330     Code Status: Full Code  Family Communication: None Disposition Plan: Awaiting placement   Status is:  Inpatient Remains inpatient appropriate because: Awaiting placement   Subjective:   Interval events noted.  No vomiting, diarrhea or abdominal pain.  She complains of difficulty sleeping at night and requested a sleeping tablet  Objective:    Vitals:   11/17/21 1551 11/17/21 2054 11/18/21 0601 11/18/21 0753  BP: 134/67 (!) 141/94 (!) 106/56 115/65  Pulse: 73 70 62 63  Resp: 18 18 16 16   Temp:  98.8 F (37.1 C) 97.9 F (36.6 C) 97.7 F (36.5 C)  TempSrc:  Oral Oral  SpO2: 99% 97% 100% 95%  Weight:      Height:       No data found.   Intake/Output Summary (Last 24 hours) at 11/18/2021 1140 Last data filed at 11/18/2021 1002 Gross per 24 hour  Intake 6 ml  Output 1000 ml  Net -994 ml   Filed Weights   11/15/21 0907 11/16/21 0500 11/17/21 1450  Weight: 75.1 kg 74.1 kg 73.6 kg    Exam:  GEN: NAD SKIN: Warm and dry EYES: No pallor or icterus ENT: MMM CV: RRR, systolic murmur in the aortic area PULM: CTA B ABD: soft, ND, NT, +BS CNS: AAO x 3, non focal EXT: Swelling, tenderness and erythema of left forearm.  This is slowly improving           Data Reviewed:   I have personally reviewed following labs and imaging studies:  Labs: Labs show the following:   Basic Metabolic Panel: Recent Labs  Lab 11/13/21 0945 11/15/21 0940 11/16/21 0844  NA 141 140 137  K 3.7 3.7 4.0  CL 106 103 102  CO2 28 27 28   GLUCOSE 113* 128* 111*  BUN 34* 28* 16  CREATININE 5.67* 5.86* 3.99*  CALCIUM 8.2* 8.6* 9.0  MG  --   --  1.9  PHOS 3.6 3.8 3.3   GFR Estimated Creatinine Clearance: 14.8 mL/min (A) (by C-G formula based on SCr of 3.99 mg/dL (H)). Liver Function Tests: Recent Labs  Lab 11/13/21 0945 11/15/21 0940  ALBUMIN 2.5* 2.7*   No results for input(s): "LIPASE", "AMYLASE" in the last 168 hours. No results for input(s): "AMMONIA" in the last 168 hours. Coagulation profile No results for input(s): "INR", "PROTIME" in the last 168 hours.  CBC: Recent  Labs  Lab 11/13/21 0945 11/15/21 0940  WBC 5.9 6.3  HGB 9.0* 9.3*  HCT 30.0* 31.1*  MCV 98.0 97.8  PLT 266 282   Cardiac Enzymes: No results for input(s): "CKTOTAL", "CKMB", "CKMBINDEX", "TROPONINI" in the last 168 hours. BNP (last 3 results) No results for input(s): "PROBNP" in the last 8760 hours. CBG: Recent Labs  Lab 11/14/21 0749 11/15/21 0740 11/16/21 0727 11/16/21 1705 11/17/21 0852  GLUCAP 96 88 98 101* 101*   D-Dimer: No results for input(s): "DDIMER" in the last 72 hours. Hgb A1c: No results for input(s): "HGBA1C" in the last 72 hours. Lipid Profile: No results for input(s): "CHOL", "HDL", "LDLCALC", "TRIG", "CHOLHDL", "LDLDIRECT" in the last 72 hours. Thyroid function studies: No results for input(s): "TSH", "T4TOTAL", "T3FREE", "THYROIDAB" in the last 72 hours.  Invalid input(s): "FREET3" Anemia work up: No results for input(s): "VITAMINB12", "FOLATE", "FERRITIN", "TIBC", "IRON", "RETICCTPCT" in the last 72 hours. Sepsis Labs: Recent Labs  Lab 11/13/21 0945 11/15/21 0940  WBC 5.9 6.3    Microbiology No results found for this or any previous visit (from the past 240 hour(s)).  Procedures and diagnostic studies:  No results found.             LOS: 26 days   [place] Copywriter, advertising on [email or web address]. If 7PM-7AM, please contact night-coverage at [email or web address]     11/18/2021, 11:40 AM

## 2021-11-19 ENCOUNTER — Inpatient Hospital Stay: Payer: Medicaid Other

## 2021-11-19 DIAGNOSIS — L03114 Cellulitis of left upper limb: Secondary | ICD-10-CM | POA: Diagnosis not present

## 2021-11-19 DIAGNOSIS — N186 End stage renal disease: Secondary | ICD-10-CM | POA: Diagnosis not present

## 2021-11-19 LAB — GLUCOSE, CAPILLARY
Glucose-Capillary: 90 mg/dL (ref 70–99)
Glucose-Capillary: 94 mg/dL (ref 70–99)

## 2021-11-19 NOTE — Progress Notes (Signed)
Progress Note    Katie [name]  FAO:[id number] DOB: 1955/11/19  DOA: 09/30/2021 PCP: Pcp, No      Brief Narrative:    Medical records reviewed and are as summarized below:  Katie [name] is a 66 y.o. female with medical history significant of ESRD, DM II, homelessness. Had been admitted 05/13-05/17 for sepsis d/t cellulits from animal bite to arm, discharged on po abx and was unable to fill the Rx, was kicked out of where she was living, no way to get HD.  Patient has been in the ED since arriving the hospital.  She was admitted to the hospital again on 10/23/2021 as patient developed cough short of breath and fever.  Chest x-ray showed pneumonia.  Patient was diagnosed with sepsis and pneumonia, was started on cefepime and vancomycin. Had MRI performed on 6/14 did not show any osteomyelitis. Patient is currently homeless, has a severe weakness.  No discharge option at this time. 6/15.  Patient has no nausea, KUB showed fecal impaction with severe dilated colon, patient was started on senna as well as scheduled lactulose.  CT chest also showed dense right lower lobe consolidation consistent with aspiration pneumonia.  CT of the left shoulder did not show any osteomyelitis or septic arthritis. Fecal impaction resolved after Nulytely on 6/17. Patient condition has stabilized, pending nursing home placement. 6/29.  Patient had a significant left upper extremity swelling, duplex ultrasound did not show DVT.  She was started on doxycycline and Keflex for cellulitis of the left upper extremity.      Assessment/Plan:   Principal Problem:   Adjustment disorder with mixed anxiety and depressed mood Active Problems:   Cellulitis   Hypertension   Type 2 diabetes mellitus (HCC)   ESRD (end stage renal disease) (HCC)   Financial difficulty   Anemia in chronic kidney disease   Sepsis ([place])   Bent bone fracture of ulna   Hospital acquired PNA   Fecal impaction (HCC)   Aortic stenosis    Left arm cellulitis   Nutrition Problem: Increased nutrient needs Etiology: catabolic illness (ESRD on HD)  Signs/Symptoms: estimated needs   Body mass index is 26.65 kg/m.  Left upper extremity cellulitis: Completed 10-day course of doxycycline and Keflex on 11/18/2021.  Obtain ultrasound of the left lower extremity to rule out any abscess.   Vomiting and diarrhea: Improved.  Amitiza and lactulose were discontinued on 11/15/2021.   ESRD on HD: She is on MWF schedule.  Follow-up with nephrologist for hemodialysis.  Recent sepsis from pneumonia: Completed antibiotics for that  Constipation, fecal impaction, hypokalemia: Improved.  Patient requested Metamucil every morning for regular bowel movement.  Other comorbidities include mild aortic stenosis, probable depression, type II DM with occasional hypoglycemia, anemia of chronic kidney disease  Trazodone as needed for sleep  Diet Order             Diet renal with fluid restriction Fluid restriction: 1200 mL Fluid; Room service appropriate? Yes; Fluid consistency: Thin  Diet effective now                            Consultants: Psychiatrist, nephrologist, gastroenterologist, orthopedic surgeon, infectious disease  Procedures: None    Medications:    (feeding supplement) PROSource Plus  30 mL Oral BID BM   carvedilol  12.5 mg Oral QPM   Chlorhexidine Gluconate Cloth  6 each Topical Q0600   citalopram  10 mg Oral Daily  epoetin (EPOGEN/PROCRIT) injection  4,000 Units Intravenous Q M,W,F-HD   furosemide  40 mg Oral Daily   heparin  5,000 Units Subcutaneous Q12H   multivitamin  1 tablet Oral QHS   pantoprazole  40 mg Oral BID   psyllium  1 packet Oral q AM   sodium chloride flush  3 mL Intravenous Q12H   traZODone  50 mg Oral QHS   Continuous Infusions:   Anti-infectives (From admission, onward)    Start     Dose/Rate Route Frequency Ordered Stop   11/09/21 1215  doxycycline (VIBRA-TABS) tablet 100 mg         100 mg Oral Every 12 hours 11/09/21 1122 11/19/21 0959   11/09/21 1215  cephALEXin (KEFLEX) capsule 500 mg        500 mg Oral Every 12 hours 11/09/21 1123 11/19/21 0959   10/24/21 1200  vancomycin (VANCOCIN) IVPB 1000 mg/200 mL premix  Status:  Discontinued        1,000 mg 200 mL/hr over 60 Minutes Intravenous Every T-Th-Sa (Hemodialysis) 10/23/21 2017 10/27/21 1303   10/24/21 1200  ceFEPIme (MAXIPIME) 2 g in sodium chloride 0.9 % 100 mL IVPB        2 g 200 mL/hr over 30 Minutes Intravenous Every T-Th-Sa (Hemodialysis) 10/23/21 2017 10/29/21 2359   10/23/21 1615  vancomycin (VANCOREADY) IVPB 1750 mg/350 mL        1,750 mg 175 mL/hr over 120 Minutes Intravenous  Once 10/23/21 1614 10/23/21 1906   10/23/21 1530  vancomycin (VANCOCIN) IVPB 1000 mg/200 mL premix  Status:  Discontinued        1,000 mg 200 mL/hr over 60 Minutes Intravenous  Once 10/23/21 1526 10/23/21 1614   10/23/21 1530  ceFEPIme (MAXIPIME) 2 g in sodium chloride 0.9 % 100 mL IVPB        2 g 200 mL/hr over 30 Minutes Intravenous  Once 10/23/21 1526 10/23/21 1703   10/02/21 2200  amoxicillin-clavulanate (AUGMENTIN) 500-125 MG per tablet 500 mg  Status:  Discontinued        1 tablet Oral Every 12 hours 10/02/21 1454 10/13/21 1151   09/30/21 2200  amoxicillin-clavulanate (AUGMENTIN) 875-125 MG per tablet 1 tablet  Status:  Discontinued        1 tablet Oral Every 12 hours 09/30/21 1803 10/02/21 1454   09/30/21 1545  Ampicillin-Sulbactam (UNASYN) 3 g in sodium chloride 0.9 % 100 mL IVPB        3 g 200 mL/hr over 30 Minutes Intravenous  Once 09/30/21 1530 09/30/21 1629              Family Communication/Anticipated D/C date and plan/Code Status   DVT prophylaxis: heparin injection 5,000 Units Start: 10/23/21 2330     Code Status: Full Code  Family Communication: None Disposition Plan: Awaiting placement   Status is: Inpatient Remains inpatient appropriate because: Awaiting placement   Subjective:    She still complains of pain in the left upper extremity.  Objective:    Vitals:   11/18/21 2144 11/19/21 0500 11/19/21 0531 11/19/21 0755  BP: (!) 150/73  (!) 158/71 (!) 165/66  Pulse: 74  69 70  Resp: 18  16   Temp: 98.8 F (37.1 C)  98.7 F (37.1 C) 98.2 F (36.8 C)  TempSrc:      SpO2: 98%  96% 97%  Weight:  74.9 kg    Height:       No data found.   Intake/Output Summary (Last 24 hours) at  11/19/2021 1209 Last data filed at 11/19/2021 1021 Gross per 24 hour  Intake 1195 ml  Output --  Net 1195 ml   Filed Weights   11/16/21 0500 11/17/21 1450 11/19/21 0500  Weight: 74.1 kg 73.6 kg 74.9 kg    Exam:  GEN: NAD SKIN: Warm and dry EYES: EOMI ENT: MMM CV: RRR PULM: CTA B ABD: soft, ND, NT, +BS CNS: AAO x 3, non focal EXT: Swelling, tenderness and erythema of the left forearm    GEN: NAD SKIN: Warm and dry EYES: No pallor or icterus ENT: MMM CV: RRR, systolic murmur in the aortic area PULM: CTA B ABD: soft, ND, NT, +BS CNS: AAO x 3, non focal EXT: Swelling, tenderness and erythema of left forearm.  This is slowly improving           Data Reviewed:   I have personally reviewed following labs and imaging studies:  Labs: Labs show the following:   Basic Metabolic Panel: Recent Labs  Lab 11/13/21 0945 11/15/21 0940 11/16/21 0844  NA 141 140 137  K 3.7 3.7 4.0  CL 106 103 102  CO2 28 27 28   GLUCOSE 113* 128* 111*  BUN 34* 28* 16  CREATININE 5.67* 5.86* 3.99*  CALCIUM 8.2* 8.6* 9.0  MG  --   --  1.9  PHOS 3.6 3.8 3.3   GFR Estimated Creatinine Clearance: 14.9 mL/min (A) (by C-G formula based on SCr of 3.99 mg/dL (H)). Liver Function Tests: Recent Labs  Lab 11/13/21 0945 11/15/21 0940  ALBUMIN 2.5* 2.7*   No results for input(s): "LIPASE", "AMYLASE" in the last 168 hours. No results for input(s): "AMMONIA" in the last 168 hours. Coagulation profile No results for input(s): "INR", "PROTIME" in the last 168  hours.  CBC: Recent Labs  Lab 11/13/21 0945 11/15/21 0940  WBC 5.9 6.3  HGB 9.0* 9.3*  HCT 30.0* 31.1*  MCV 98.0 97.8  PLT 266 282   Cardiac Enzymes: No results for input(s): "CKTOTAL", "CKMB", "CKMBINDEX", "TROPONINI" in the last 168 hours. BNP (last 3 results) No results for input(s): "PROBNP" in the last 8760 hours. CBG: Recent Labs  Lab 11/16/21 0727 11/16/21 1705 11/17/21 0852 11/18/21 1654 11/19/21 0528  GLUCAP 98 101* 101* 112* 90   D-Dimer: No results for input(s): "DDIMER" in the last 72 hours. Hgb A1c: No results for input(s): "HGBA1C" in the last 72 hours. Lipid Profile: No results for input(s): "CHOL", "HDL", "LDLCALC", "TRIG", "CHOLHDL", "LDLDIRECT" in the last 72 hours. Thyroid function studies: No results for input(s): "TSH", "T4TOTAL", "T3FREE", "THYROIDAB" in the last 72 hours.  Invalid input(s): "FREET3" Anemia work up: No results for input(s): "VITAMINB12", "FOLATE", "FERRITIN", "TIBC", "IRON", "RETICCTPCT" in the last 72 hours. Sepsis Labs: Recent Labs  Lab 11/13/21 0945 11/15/21 0940  WBC 5.9 6.3    Microbiology No results found for this or any previous visit (from the past 240 hour(s)).  Procedures and diagnostic studies:  No results found.             LOS: 27 days   [place] Copywriter, advertising on [email or web address]. If 7PM-7AM, please contact night-coverage at [email or web address]     11/19/2021, 12:09 PM

## 2021-11-19 NOTE — Progress Notes (Signed)
[facility]  Dialysis Note   Subjective:   Patient was seen today on first floor. Patient resting comfortably on the bed  Objective:  Vital signs in last 24 hours:  Temp:  [97.7 F (36.5 C)-98.8 F (37.1 C)] 98.7 F (37.1 C) (07/09 0531) Pulse Rate:  [63-74] 69 (07/09 0531) Resp:  [16-18] 16 (07/09 0531) BP: (115-158)/(51-73) 158/71 (07/09 0531) SpO2:  [95 %-99 %] 96 % (07/09 0531)  Weight change:  Filed Weights   11/15/21 0907 11/16/21 0500 11/17/21 1450  Weight: 75.1 kg 74.1 kg 73.6 kg    Intake/Output: I/O last 3 completed shifts: In: 961 [P.O.:955; I.V.:6] Out: 1000 [Other:1000]   Intake/Output this shift:  No intake/output data recorded.  Physical Exam: General: NAD  Head: Normocephalic, atraumatic. Moist oral mucosal membranes  Eyes: Anicteric  Lungs:  Clear to auscultation, normal effort  Heart: Regular rate and rhythm  Abdomen:  Soft, nontender  Extremities: No peripheral edema.  Left upper extremity edema  Neurologic: Nonfocal, moving all four extremities  Skin: No lesions  Access: Right PermCath    Basic Metabolic Panel: Recent Labs  Lab 11/13/21 0945 11/15/21 0940 11/16/21 0844  NA 141 140 137  K 3.7 3.7 4.0  CL 106 103 102  CO2 28 27 28   GLUCOSE 113* 128* 111*  BUN 34* 28* 16  CREATININE 5.67* 5.86* 3.99*  CALCIUM 8.2* 8.6* 9.0  MG  --   --  1.9  PHOS 3.6 3.8 3.3    Liver Function Tests: Recent Labs  Lab 11/13/21 0945 11/15/21 0940  ALBUMIN 2.5* 2.7*   No results for input(s): "LIPASE", "AMYLASE" in the last 168 hours. No results for input(s): "AMMONIA" in the last 168 hours.  CBC: Recent Labs  Lab 11/13/21 0945 11/15/21 0940  WBC 5.9 6.3  HGB 9.0* 9.3*  HCT 30.0* 31.1*  MCV 98.0 97.8  PLT 266 282    Cardiac Enzymes: No results for input(s): "CKTOTAL", "CKMB", "CKMBINDEX", "TROPONINI" in the last 168 hours.  BNP: Invalid input(s): "POCBNP"  CBG: Recent Labs  Lab 11/16/21 0727 11/16/21 1705  11/17/21 0852 11/18/21 1654 11/19/21 0528  GLUCAP 98 101* 101* 112* 90    Microbiology: Results for orders placed or performed during the hospital encounter of 09/30/21  Resp Panel by RT-PCR (Flu A&B, Covid) Nasopharyngeal Swab     Status: None   Collection Time: 09/30/21  9:34 AM   Specimen: Nasopharyngeal Swab; Nasopharyngeal(NP) swabs in vial transport medium  Result Value Ref Range Status   SARS Coronavirus 2 by RT PCR NEGATIVE NEGATIVE Final    Comment: (NOTE) SARS-CoV-2 target nucleic acids are NOT DETECTED.  The SARS-CoV-2 RNA is generally detectable in upper respiratory specimens during the acute phase of infection. The lowest concentration of SARS-CoV-2 viral copies this assay can detect is 138 copies/mL. A negative result does not preclude SARS-Cov-2 infection and should not be used as the sole basis for treatment or other patient management decisions. A negative result may occur with  improper specimen collection/handling, submission of specimen other than nasopharyngeal swab, presence of viral mutation(s) within the areas targeted by this assay, and inadequate number of viral copies(<138 copies/mL). A negative result must be combined with clinical observations, patient history, and epidemiological information. The expected result is Negative.  Fact Sheet for Patients:  EntrepreneurPulse.com.au  Fact Sheet for Healthcare Providers:  IncredibleEmployment.be  This test is no t yet approved or cleared by the [place] FDA and  has been authorized for detection and/or diagnosis  of SARS-CoV-2 by FDA under an Emergency Use Authorization (EUA). This EUA will remain  in effect (meaning this test can be used) for the duration of the COVID-19 declaration under Section 564(b)(1) of the Act, 21 U.S.C.section 360bbb-3(b)(1), unless the authorization is terminated  or revoked sooner.       Influenza A by PCR NEGATIVE NEGATIVE Final    Influenza B by PCR NEGATIVE NEGATIVE Final    Comment: (NOTE) The Xpert Xpress SARS-CoV-2/FLU/RSV plus assay is intended as an aid in the diagnosis of influenza from Nasopharyngeal swab specimens and should not be used as a sole basis for treatment. Nasal washings and aspirates are unacceptable for Xpert Xpress SARS-CoV-2/FLU/RSV testing.  Fact Sheet for Patients: EntrepreneurPulse.com.au  Fact Sheet for Healthcare Providers: IncredibleEmployment.be  This test is not yet approved or cleared by the [place] FDA and has been authorized for detection and/or diagnosis of SARS-CoV-2 by FDA under an Emergency Use Authorization (EUA). This EUA will remain in effect (meaning this test can be used) for the duration of the COVID-19 declaration under Section 564(b)(1) of the Act, 21 U.S.C. section 360bbb-3(b)(1), unless the authorization is terminated or revoked.  Performed at [facility], [address] [place], [place] [address]   SARS Coronavirus 2 by RT PCR (hospital order, performed in [facility] hospital lab) *cepheid single result test* Anterior Nasal Swab     Status: None   Collection Time: 10/23/21  4:06 PM   Specimen: Anterior Nasal Swab  Result Value Ref Range Status   SARS Coronavirus 2 by RT PCR NEGATIVE NEGATIVE Final    Comment: (NOTE) SARS-CoV-2 target nucleic acids are NOT DETECTED.  The SARS-CoV-2 RNA is generally detectable in upper and lower respiratory specimens during the acute phase of infection. The lowest concentration of SARS-CoV-2 viral copies this assay can detect is 250 copies / mL. A negative result does not preclude SARS-CoV-2 infection and should not be used as the sole basis for treatment or other patient management decisions.  A negative result may occur with improper specimen collection / handling, submission of specimen other than nasopharyngeal swab, presence of viral mutation(s) within the areas  targeted by this assay, and inadequate number of viral copies (<250 copies / mL). A negative result must be combined with clinical observations, patient history, and epidemiological information.  Fact Sheet for Patients:   https://www.patel.info/  Fact Sheet for Healthcare Providers: https://hall.com/  This test is not yet approved or  cleared by the [place] FDA and has been authorized for detection and/or diagnosis of SARS-CoV-2 by FDA under an Emergency Use Authorization (EUA).  This EUA will remain in effect (meaning this test can be used) for the duration of the COVID-19 declaration under Section 564(b)(1) of the Act, 21 U.S.C. section 360bbb-3(b)(1), unless the authorization is terminated or revoked sooner.  Performed at [facility], [address] [place], [place] [address]   Blood culture (routine x 2)     Status: None   Collection Time: 10/23/21  4:06 PM   Specimen: BLOOD  Result Value Ref Range Status   Specimen Description BLOOD BLOOD RIGHT HAND  Final   Special Requests   Final    BOTTLES DRAWN AEROBIC AND ANAEROBIC Blood Culture adequate volume   Culture   Final    NO GROWTH 5 DAYS Performed at [facility], [address] [place], [place] [address]    Report Status 10/28/2021 FINAL  Final  Culture, blood (Routine X 2) w Reflex to  ID Panel     Status: None   Collection Time: 10/24/21 12:06 AM   Specimen: BLOOD  Result Value Ref Range Status   Specimen Description BLOOD LEFT HAND  Final   Special Requests   Final    BOTTLES DRAWN AEROBIC AND ANAEROBIC Blood Culture adequate volume   Culture   Final    NO GROWTH 5 DAYS Performed at [facility], [address], [place], [place] [address]    Report Status 10/29/2021 FINAL  Final  MRSA Next Gen by PCR, Nasal     Status: None   Collection Time: 10/24/21  2:24 PM   Specimen: Nasal Mucosa; Nasal Swab  Result Value Ref Range Status    MRSA by PCR Next Gen NOT DETECTED NOT DETECTED Final    Comment: (NOTE) The GeneXpert MRSA Assay (FDA approved for NASAL specimens only), is one component of a comprehensive MRSA colonization surveillance program. It is not intended to diagnose MRSA infection nor to guide or monitor treatment for MRSA infections. Test performance is not FDA approved in patients less than 106 years old. Performed at [facility], [place], [place] [address]   Respiratory (~20 pathogens) panel by PCR     Status: None   Collection Time: 10/24/21  2:24 PM   Specimen: Nasopharyngeal Swab; Respiratory  Result Value Ref Range Status   Adenovirus NOT DETECTED NOT DETECTED Final   Coronavirus 229E NOT DETECTED NOT DETECTED Final    Comment: (NOTE) The Coronavirus on the Respiratory Panel, DOES NOT test for the novel  Coronavirus (2019 nCoV)    Coronavirus HKU1 NOT DETECTED NOT DETECTED Final   Coronavirus NL63 NOT DETECTED NOT DETECTED Final   Coronavirus OC43 NOT DETECTED NOT DETECTED Final   Metapneumovirus NOT DETECTED NOT DETECTED Final   Rhinovirus / Enterovirus NOT DETECTED NOT DETECTED Final   Influenza A NOT DETECTED NOT DETECTED Final   Influenza B NOT DETECTED NOT DETECTED Final   Parainfluenza Virus 1 NOT DETECTED NOT DETECTED Final   Parainfluenza Virus 2 NOT DETECTED NOT DETECTED Final   Parainfluenza Virus 3 NOT DETECTED NOT DETECTED Final   Parainfluenza Virus 4 NOT DETECTED NOT DETECTED Final   Respiratory Syncytial Virus NOT DETECTED NOT DETECTED Final   Bordetella pertussis NOT DETECTED NOT DETECTED Final   Bordetella Parapertussis NOT DETECTED NOT DETECTED Final   Chlamydophila pneumoniae NOT DETECTED NOT DETECTED Final   Mycoplasma pneumoniae NOT DETECTED NOT DETECTED Final    Comment: Performed at [facility], [place]. [address] [place], [place] [address]    Coagulation Studies: No results for input(s): "LABPROT", "INR" in the last 72  hours.  Urinalysis: No results for input(s): "COLORURINE", "LABSPEC", "PHURINE", "GLUCOSEU", "HGBUR", "BILIRUBINUR", "KETONESUR", "PROTEINUR", "UROBILINOGEN", "NITRITE", "LEUKOCYTESUR" in the last 72 hours.  Invalid input(s): "APPERANCEUR"    Imaging: No results found.   Medications:      (feeding supplement) PROSource Plus  30 mL Oral BID BM   acidophilus  2 capsule Oral TID   carvedilol  12.5 mg Oral QPM   cephALEXin  500 mg Oral Q12H   Chlorhexidine Gluconate Cloth  6 each Topical Q0600   citalopram  10 mg Oral Daily   doxycycline  100 mg Oral Q12H   epoetin (EPOGEN/PROCRIT) injection  4,000 Units Intravenous Q M,W,F-HD   furosemide  40 mg Oral Daily   heparin  5,000 Units Subcutaneous Q12H   multivitamin  1 tablet Oral QHS   pantoprazole  40 mg Oral  BID   psyllium  1 packet Oral q AM   sodium chloride flush  3 mL Intravenous Q12H   traZODone  50 mg Oral QHS   acetaminophen **OR** acetaminophen, albuterol, guaiFENesin, loperamide, ondansetron, oxyCODONE-acetaminophen, senna-docusate, tiZANidine, topiramate  Assessment/ Plan:  Ms. Nealy Hickmon is a 66 y.o.  female with past medical history of ESRD with hemodialysis TTS, diabetes mellitus, hypertension to the hospital seeking long term placement. She was recently admitted and received antibiotics for a dog bite.  She was discharged with a prescription for home antibiotics.  She states her caregivers were unable to retrieve antibiotics.  She states that there was an argument and that she is here seeking long-term placement.  Principal Problem:   Adjustment disorder with mixed anxiety and depressed mood Active Problems:   Hypertension   Type 2 diabetes mellitus (HCC)   ESRD (end stage renal disease) ([place])   Financial difficulty   Anemia in chronic kidney disease   Sepsis ([place])   Cellulitis   Bent bone fracture of ulna   Hospital acquired PNA   Fecal impaction (HCC)   Aortic stenosis   Left arm cellulitis   [facility]  [facility]/MWF/right PermCath/83.5 kg  End Stage Renal Disease on hemodialysis:  Patient was last dialyzed on Friday  No need for renal placement therapy today  Next treatment scheduled for Monday.  Renal navigator continues to seek outpatient  clinic that will accommodate rehab chair request.   2. Hypertension with chronic kidney disease:   Patient remains on carvedilol and furosemide only.     BP (!) 158/71 (BP Location: Right Arm)   Pulse 69   Temp 98.7 F (37.1 C)   Resp 16   Ht 5\' 6"  (1.676 m)   Wt 73.6 kg   SpO2 96%   BMI 26.19 kg/m   3. Anemia of chronic kidney disease/ kidney injury/chronic disease/acute blood loss:   Lab Results  Component Value Date   HGB 9.3 (L) 11/15/2021  Hemoglobin at goal Continue EPO with dialysis treatments.  4. Secondary Hyperparathyroidism:     Lab Results  Component Value Date   PTH 70 (H) 05/12/2020   CALCIUM 9.0 11/16/2021   PHOS 3.3 11/16/2021   Calcium and phosphorus are within goal  5.  Severe constipation Managed with stool softeners.    LOS: [facility] 7/9/20236:02 AM

## 2021-11-20 DIAGNOSIS — L03114 Cellulitis of left upper limb: Secondary | ICD-10-CM | POA: Diagnosis not present

## 2021-11-20 DIAGNOSIS — N186 End stage renal disease: Secondary | ICD-10-CM | POA: Diagnosis not present

## 2021-11-20 LAB — GLUCOSE, CAPILLARY: Glucose-Capillary: 85 mg/dL (ref 70–99)

## 2021-11-20 MED ORDER — HEPARIN SODIUM (PORCINE) 1000 UNIT/ML IJ SOLN
INTRAMUSCULAR | Status: AC
  Filled 2021-11-20: qty 10

## 2021-11-20 MED ORDER — ONDANSETRON HCL 4 MG/2ML IJ SOLN: 4.0000 mg | Freq: Once | INTRAMUSCULAR | Status: DC

## 2021-11-20 MED ORDER — DIPHENHYDRAMINE HCL 50 MG/ML IJ SOLN
50.0000 mg | Freq: Once | INTRAMUSCULAR | Status: AC
  Administered 2021-11-20: 50 mg via INTRAVENOUS

## 2021-11-20 MED ORDER — EPOETIN ALFA 4000 UNIT/ML IJ SOLN
INTRAMUSCULAR | Status: AC
  Filled 2021-11-20: qty 1

## 2021-11-20 MED ORDER — EPOETIN ALFA 4000 UNIT/ML IJ SOLN
4000.0000 [IU] | INTRAMUSCULAR | Status: DC
  Administered 2021-11-20: 4000 [IU] via INTRAVENOUS
  Filled 2021-11-20: qty 1

## 2021-11-20 MED ORDER — DIPHENHYDRAMINE HCL 50 MG/ML IJ SOLN
INTRAMUSCULAR | Status: AC
  Filled 2021-11-20: qty 1

## 2021-11-20 NOTE — Progress Notes (Signed)
Progress Note    Katie [name]  HER:[id number] DOB: 1955-06-21  DOA: 09/30/2021 PCP: Pcp, No      Brief Narrative:    Medical records reviewed and are as summarized below:  Evin Chirco is a 66 y.o. female with medical history significant of ESRD, DM II, homelessness. Had been admitted 05/13-05/17 for sepsis d/t cellulits from animal bite to arm, discharged on po abx and was unable to fill the Rx, was kicked out of where she was living, no way to get HD.  Patient has been in the ED since arriving the hospital.  She was admitted to the hospital again on 10/23/2021 as patient developed cough short of breath and fever.  Chest x-ray showed pneumonia.  Patient was diagnosed with sepsis and pneumonia, was started on cefepime and vancomycin. Had MRI performed on 6/14 did not show any osteomyelitis. Patient is currently homeless, has a severe weakness.  No discharge option at this time. 6/15.  Patient has no nausea, KUB showed fecal impaction with severe dilated colon, patient was started on senna as well as scheduled lactulose.  CT chest also showed dense right lower lobe consolidation consistent with aspiration pneumonia.  CT of the left shoulder did not show any osteomyelitis or septic arthritis. Fecal impaction resolved after N[place] on 6/17. Patient condition has stabilized, pending nursing home placement. 6/29.  Patient had a significant left upper extremity swelling, duplex ultrasound did not show DVT.  She was started on doxycycline and Keflex for cellulitis of the left upper extremity.      Assessment/Plan:   Principal Problem:   Adjustment disorder with mixed anxiety and depressed mood Active Problems:   Cellulitis   Hypertension   Type 2 diabetes mellitus (HCC)   ESRD (end stage renal disease) (HCC)   Financial difficulty   Anemia in chronic kidney disease   Sepsis ([place])   Bent bone fracture of ulna   Hospital acquired PNA   Fecal impaction (HCC)   Aortic stenosis    Left arm cellulitis   Nutrition Problem: Increased nutrient needs Etiology: catabolic illness (ESRD on HD)  Signs/Symptoms: estimated needs   Body mass index is 26.58 kg/m.  Left upper extremity cellulitis: Completed 10-day course of doxycycline and Keflex on 11/18/2021.  Ultrasound of the left upper extremity done on 11/19/2021 did not show any abscess.  No evidence of DVT in the left upper extremity from venous duplex on 11/08/2021.  Use heating pad as needed.  Vomiting and diarrhea: Improved.  Amitiza and lactulose were discontinued on 11/15/2021.   ESRD on HD: She is on MWF schedule.  Follow-up with nephrologist for hemodialysis.  Recent sepsis from pneumonia: Completed antibiotics for that  Constipation, fecal impaction, hypokalemia: Improved.  Patient requested Metamucil every morning for regular bowel movement.  Other comorbidities include mild aortic stenosis, probable depression, type II DM with occasional hypoglycemia, anemia of chronic kidney disease  Trazodone as needed for sleep  Diet Order             Diet renal with fluid restriction Fluid restriction: 1200 mL Fluid; Room service appropriate? Yes; Fluid consistency: Thin  Diet effective now                            Consultants: Psychiatrist, nephrologist, gastroenterologist, orthopedic surgeon, infectious disease  Procedures: None    Medications:    (feeding supplement) PROSource Plus  30 mL Oral BID BM   carvedilol  12.5  mg Oral QPM   Chlorhexidine Gluconate Cloth  6 each Topical Q0600   citalopram  10 mg Oral Daily   diphenhydrAMINE       epoetin alfa       epoetin (EPOGEN/PROCRIT) injection  4,000 Units Intravenous Q M,W,F-HD   furosemide  40 mg Oral Daily   heparin  5,000 Units Subcutaneous Q12H   heparin sodium (porcine)       multivitamin  1 tablet Oral QHS   ondansetron (ZOFRAN) IV  4 mg Intravenous Once   pantoprazole  40 mg Oral BID   psyllium  1 packet Oral q AM   sodium  chloride flush  3 mL Intravenous Q12H   traZODone  50 mg Oral QHS   Continuous Infusions:   Anti-infectives (From admission, onward)    Start     Dose/Rate Route Frequency Ordered Stop   11/09/21 1215  doxycycline (VIBRA-TABS) tablet 100 mg        100 mg Oral Every 12 hours 11/09/21 1122 11/19/21 0959   11/09/21 1215  cephALEXin (KEFLEX) capsule 500 mg        500 mg Oral Every 12 hours 11/09/21 1123 11/19/21 0959   10/24/21 1200  vancomycin (VANCOCIN) IVPB 1000 mg/200 mL premix  Status:  Discontinued        1,000 mg 200 mL/hr over 60 Minutes Intravenous Every T-Th-Sa (Hemodialysis) 10/23/21 2017 10/27/21 1303   10/24/21 1200  ceFEPIme (MAXIPIME) 2 g in sodium chloride 0.9 % 100 mL IVPB        2 g 200 mL/hr over 30 Minutes Intravenous Every T-Th-Sa (Hemodialysis) 10/23/21 2017 10/29/21 2359   10/23/21 1615  vancomycin (VANCOREADY) IVPB 1750 mg/350 mL        1,750 mg 175 mL/hr over 120 Minutes Intravenous  Once 10/23/21 1614 10/23/21 1906   10/23/21 1530  vancomycin (VANCOCIN) IVPB 1000 mg/200 mL premix  Status:  Discontinued        1,000 mg 200 mL/hr over 60 Minutes Intravenous  Once 10/23/21 1526 10/23/21 1614   10/23/21 1530  ceFEPIme (MAXIPIME) 2 g in sodium chloride 0.9 % 100 mL IVPB        2 g 200 mL/hr over 30 Minutes Intravenous  Once 10/23/21 1526 10/23/21 1703   10/02/21 2200  amoxicillin-clavulanate (AUGMENTIN) 500-125 MG per tablet 500 mg  Status:  Discontinued        1 tablet Oral Every 12 hours 10/02/21 1454 10/13/21 1151   09/30/21 2200  amoxicillin-clavulanate (AUGMENTIN) 875-125 MG per tablet 1 tablet  Status:  Discontinued        1 tablet Oral Every 12 hours 09/30/21 1803 10/02/21 1454   09/30/21 1545  Ampicillin-Sulbactam (UNASYN) 3 g in sodium chloride 0.9 % 100 mL IVPB        3 g 200 mL/hr over 30 Minutes Intravenous  Once 09/30/21 1530 09/30/21 1629              Family Communication/Anticipated D/C date and plan/Code Status   DVT prophylaxis:  heparin injection 5,000 Units Start: 10/23/21 2330     Code Status: Full Code  Family Communication: None Disposition Plan: Awaiting placement   Status is: Inpatient Remains inpatient appropriate because: Awaiting placement   Subjective:   Interval events noted.  No new complaints.  She still has some pain and swelling in the left upper extremity although it is better.   Objective:    Vitals:   11/20/21 1130 11/20/21 1145 11/20/21 1200 11/20/21 1215  BP: Marland Kitchen)  96/52 (!) 92/53 (!) 102/52   Pulse: 60 (!) 59 (!) 58 (!) 59  Resp: 10 10 16 17   Temp:      TempSrc:      SpO2: 99% 100% 100% 100%  Weight:      Height:       No data found.  No intake or output data in the 24 hours ending 11/20/21 1216  Filed Weights   11/17/21 1450 11/19/21 0500 11/20/21 1033  Weight: 73.6 kg 74.9 kg 74.7 kg    Exam:  GEN: NAD SKIN: Warm and dry EYES: No pallor or icterus ENT: MMM CV: RRR, systolic murmur in the aortic area PULM: CTA B ABD: soft, obese, NT, +BS CNS: AAO x 3, non focal EXT: Swelling, tenderness and erythema of the left forearm               Data Reviewed:   I have personally reviewed following labs and imaging studies:  Labs: Labs show the following:   Basic Metabolic Panel: Recent Labs  Lab 11/15/21 0940 11/16/21 0844  NA 140 137  K 3.7 4.0  CL 103 102  CO2 27 28  GLUCOSE 128* 111*  BUN 28* 16  CREATININE 5.86* 3.99*  CALCIUM 8.6* 9.0  MG  --  1.9  PHOS 3.8 3.3   GFR Estimated Creatinine Clearance: 14.9 mL/min (A) (by C-G formula based on SCr of 3.99 mg/dL (H)). Liver Function Tests: Recent Labs  Lab 11/15/21 0940  ALBUMIN 2.7*   No results for input(s): "LIPASE", "AMYLASE" in the last 168 hours. No results for input(s): "AMMONIA" in the last 168 hours. Coagulation profile No results for input(s): "INR", "PROTIME" in the last 168 hours.  CBC: Recent Labs  Lab 11/15/21 0940  WBC 6.3  HGB 9.3*  HCT 31.1*  MCV 97.8  PLT  282   Cardiac Enzymes: No results for input(s): "CKTOTAL", "CKMB", "CKMBINDEX", "TROPONINI" in the last 168 hours. BNP (last 3 results) No results for input(s): "PROBNP" in the last 8760 hours. CBG: Recent Labs  Lab 11/17/21 0852 11/18/21 1654 11/19/21 0528 11/19/21 1746 11/20/21 0742  GLUCAP 101* 112* 90 94 85   D-Dimer: No results for input(s): "DDIMER" in the last 72 hours. Hgb A1c: No results for input(s): "HGBA1C" in the last 72 hours. Lipid Profile: No results for input(s): "CHOL", "HDL", "LDLCALC", "TRIG", "CHOLHDL", "LDLDIRECT" in the last 72 hours. Thyroid function studies: No results for input(s): "TSH", "T4TOTAL", "T3FREE", "THYROIDAB" in the last 72 hours.  Invalid input(s): "FREET3" Anemia work up: No results for input(s): "VITAMINB12", "FOLATE", "FERRITIN", "TIBC", "IRON", "RETICCTPCT" in the last 72 hours. Sepsis Labs: Recent Labs  Lab 11/15/21 0940  WBC 6.3    Microbiology No results found for this or any previous visit (from the past 240 hour(s)).  Procedures and diagnostic studies:  [place] LT UPPER EXTREM LTD SOFT TISSUE NON VASCULAR  Result Date: 11/19/2021 CLINICAL DATA:  Left arm pain, redness, and swelling. EXAM: ULTRASOUND LEFT UPPER EXTREMITY LIMITED TECHNIQUE: Ultrasound examination of the upper extremity soft tissues was performed in the area of clinical concern. COMPARISON:  None Available. FINDINGS: Focal ultrasound of the areas of concern in the left upper arm and forearm demonstrate no discrete soft tissue mass or fluid collection. IMPRESSION: 1. Negative. No sonographic abnormality at the sites of the palpable abnormalities confirmed by the patient. Electronically Signed   By: Titus Dubin M.D.   On: 11/19/2021 13:35  LOS: 28 days   [place] Copywriter, advertising on [email or web address]. If 7PM-7AM, please contact night-coverage at [email or web address]     11/20/2021, 12:16 PM

## 2021-11-20 NOTE — Progress Notes (Signed)
Hemodialysis Post Treatment Note:  Tx date:11/20/2021 Tx time: 3 hours Access: right CVC UF Removed:  1002 ml  Note:   PRE HD BP 111/66 10:38 HD started    11:21 BP low 73/43 pt asymptomatic, repeat BP still low at 73/41 UF net goal decreased to 1 L from 2 L as per NP [place]. Then 100 CC NSS flushed to help increase BP. [place] BP 97/52  11:30 BP 96/52  11:45  BP 92/53   12:00  BP 102/52  13:43 HD treatment completed. Post HD BP  108/64

## 2021-11-20 NOTE — Progress Notes (Signed)
PT Cancellation Note  Patient Details Name: Katie [name] MRN: [id number] DOB: March 17, 1956   Cancelled Treatment:    Reason Eval/Treat Not Completed: Other (comment)  Pt offered session, stated she was just up with OT and awaiting dialysis. Stated she walked to/from sink without AD.  Will return tomorrow to spread out therapy sessions.   Chesley Noon 11/20/2021, 10:09 AM

## 2021-11-20 NOTE — Progress Notes (Signed)
[facility]  Dialysis Note   Subjective:   Patient seen and evaluated during the [place]   HEMODIALYSIS FLOWSHEET:  Blood Flow Rate (mL/min): 350 mL/min Arterial Pressure (mmHg): -190 mmHg Venous Pressure (mmHg): 110 mmHg Transmembrane Pressure (mmHg): 50 mmHg Ultrafiltration Rate (mL/min): 400 mL/min Dialysate Flow Rate (mL/min): 600 ml/min Conductivity: 13.8 Conductivity: 13.8 Dialysis Fluid Bolus: Normal Saline Bolus Amount (mL): 250 mL  Patient states after being given Benadryl and Zofran for postdialysis sickness last week, symptoms were improved.  We will continue this treatment prior to treatment.  Objective:  Vital signs in last 24 hours:  Temp:  [97.6 F (36.4 C)-98.3 F (36.8 C)] 98.3 F (36.8 C) (07/10 1030) Pulse Rate:  [58-71] 60 (07/10 1230) Resp:  [10-21] 16 (07/10 1230) BP: (72-141)/(41-83) 107/53 (07/10 1215) SpO2:  [93 %-100 %] 100 % (07/10 1230) Weight:  [74.7 kg] 74.7 kg (07/10 1033)  Weight change:  Filed Weights   11/17/21 1450 11/19/21 0500 11/20/21 1033  Weight: 73.6 kg 74.9 kg 74.7 kg    Intake/Output: I/O last 3 completed shifts: In: 240 [P.O.:240] Out: -    Intake/Output this shift:  No intake/output data recorded.  Physical Exam: General: NAD  Head: Normocephalic, atraumatic. Moist oral mucosal membranes  Eyes: Anicteric  Lungs:  Clear to auscultation, normal effort  Heart: Regular rate and rhythm  Abdomen:  Soft, nontender  Extremities: No peripheral edema.   Neurologic: Nonfocal, moving all four extremities  Skin: No lesions  Access: Right PermCath    Basic Metabolic Panel: Recent Labs  Lab 11/15/21 0940 11/16/21 0844  NA 140 137  K 3.7 4.0  CL 103 102  CO2 27 28  GLUCOSE 128* 111*  BUN 28* 16  CREATININE 5.86* 3.99*  CALCIUM 8.6* 9.0  MG  --  1.9  PHOS 3.8 3.3     Liver Function Tests: Recent Labs  Lab 11/15/21 0940  ALBUMIN 2.7*    No results for input(s): "LIPASE", "AMYLASE" in the last  168 hours. No results for input(s): "AMMONIA" in the last 168 hours.  CBC: Recent Labs  Lab 11/15/21 0940  WBC 6.3  HGB 9.3*  HCT 31.1*  MCV 97.8  PLT 282     Cardiac Enzymes: No results for input(s): "CKTOTAL", "CKMB", "CKMBINDEX", "TROPONINI" in the last 168 hours.  BNP: Invalid input(s): "POCBNP"  CBG: Recent Labs  Lab 11/17/21 0852 11/18/21 1654 11/19/21 0528 11/19/21 1746 11/20/21 0742  GLUCAP 101* 112* 90 94 85     Microbiology: Results for orders placed or performed during the hospital encounter of 09/30/21  Resp Panel by RT-PCR (Flu A&B, Covid) Nasopharyngeal Swab     Status: None   Collection Time: 09/30/21  9:34 AM   Specimen: Nasopharyngeal Swab; Nasopharyngeal(NP) swabs in vial transport medium  Result Value Ref Range Status   SARS Coronavirus 2 by RT PCR NEGATIVE NEGATIVE Final    Comment: (NOTE) SARS-CoV-2 target nucleic acids are NOT DETECTED.  The SARS-CoV-2 RNA is generally detectable in upper respiratory specimens during the acute phase of infection. The lowest concentration of SARS-CoV-2 viral copies this assay can detect is 138 copies/mL. A negative result does not preclude SARS-Cov-2 infection and should not be used as the sole basis for treatment or other patient management decisions. A negative result may occur with  improper specimen collection/handling, submission of specimen other than nasopharyngeal swab, presence of viral mutation(s) within the areas targeted by this assay, and inadequate number of viral copies(<138 copies/mL). A negative result must  be combined with clinical observations, patient history, and epidemiological information. The expected result is Negative.  Fact Sheet for Patients:  EntrepreneurPulse.com.au  Fact Sheet for Healthcare Providers:  IncredibleEmployment.be  This test is no t yet approved or cleared by the [place] FDA and  has been authorized for detection  and/or diagnosis of SARS-CoV-2 by FDA under an Emergency Use Authorization (EUA). This EUA will remain  in effect (meaning this test can be used) for the duration of the COVID-19 declaration under Section 564(b)(1) of the Act, 21 U.S.C.section 360bbb-3(b)(1), unless the authorization is terminated  or revoked sooner.       Influenza A by PCR NEGATIVE NEGATIVE Final   Influenza B by PCR NEGATIVE NEGATIVE Final    Comment: (NOTE) The Xpert Xpress SARS-CoV-2/FLU/RSV plus assay is intended as an aid in the diagnosis of influenza from Nasopharyngeal swab specimens and should not be used as a sole basis for treatment. Nasal washings and aspirates are unacceptable for Xpert Xpress SARS-CoV-2/FLU/RSV testing.  Fact Sheet for Patients: EntrepreneurPulse.com.au  Fact Sheet for Healthcare Providers: IncredibleEmployment.be  This test is not yet approved or cleared by the [place] FDA and has been authorized for detection and/or diagnosis of SARS-CoV-2 by FDA under an Emergency Use Authorization (EUA). This EUA will remain in effect (meaning this test can be used) for the duration of the COVID-19 declaration under Section 564(b)(1) of the Act, 21 U.S.C. section 360bbb-3(b)(1), unless the authorization is terminated or revoked.  Performed at [facility], [address] [place], [place] [address]   SARS Coronavirus 2 by RT PCR (hospital order, performed in [facility] hospital lab) *cepheid single result test* Anterior Nasal Swab     Status: None   Collection Time: 10/23/21  4:06 PM   Specimen: Anterior Nasal Swab  Result Value Ref Range Status   SARS Coronavirus 2 by RT PCR NEGATIVE NEGATIVE Final    Comment: (NOTE) SARS-CoV-2 target nucleic acids are NOT DETECTED.  The SARS-CoV-2 RNA is generally detectable in upper and lower respiratory specimens during the acute phase of infection. The lowest concentration of SARS-CoV-2 viral  copies this assay can detect is 250 copies / mL. A negative result does not preclude SARS-CoV-2 infection and should not be used as the sole basis for treatment or other patient management decisions.  A negative result may occur with improper specimen collection / handling, submission of specimen other than nasopharyngeal swab, presence of viral mutation(s) within the areas targeted by this assay, and inadequate number of viral copies (<250 copies / mL). A negative result must be combined with clinical observations, patient history, and epidemiological information.  Fact Sheet for Patients:   https://www.patel.info/  Fact Sheet for Healthcare Providers: https://hall.com/  This test is not yet approved or  cleared by the [place] FDA and has been authorized for detection and/or diagnosis of SARS-CoV-2 by FDA under an Emergency Use Authorization (EUA).  This EUA will remain in effect (meaning this test can be used) for the duration of the COVID-19 declaration under Section 564(b)(1) of the Act, 21 U.S.C. section 360bbb-3(b)(1), unless the authorization is terminated or revoked sooner.  Performed at [facility], [address] [place], [place] [address]   Blood culture (routine x 2)     Status: None   Collection Time: 10/23/21  4:06 PM   Specimen: BLOOD  Result Value Ref Range Status   Specimen Description BLOOD BLOOD RIGHT HAND  Final   Special Requests   Final  BOTTLES DRAWN AEROBIC AND ANAEROBIC Blood Culture adequate volume   Culture   Final    NO GROWTH 5 DAYS Performed at [facility], [address] [place], [place] [address]    Report Status 10/28/2021 FINAL  Final  Culture, blood (Routine X 2) w Reflex to ID Panel     Status: None   Collection Time: 10/24/21 12:06 AM   Specimen: BLOOD  Result Value Ref Range Status   Specimen Description BLOOD LEFT HAND  Final   Special Requests   Final     BOTTLES DRAWN AEROBIC AND ANAEROBIC Blood Culture adequate volume   Culture   Final    NO GROWTH 5 DAYS Performed at [facility], [address] [place], [place] [address]    Report Status 10/29/2021 FINAL  Final  MRSA Next Gen by PCR, Nasal     Status: None   Collection Time: 10/24/21  2:24 PM   Specimen: Nasal Mucosa; Nasal Swab  Result Value Ref Range Status   MRSA by PCR Next Gen NOT DETECTED NOT DETECTED Final    Comment: (NOTE) The GeneXpert MRSA Assay (FDA approved for NASAL specimens only), is one component of a comprehensive MRSA colonization surveillance program. It is not intended to diagnose MRSA infection nor to guide or monitor treatment for MRSA infections. Test performance is not FDA approved in patients less than 39 years old. Performed at [facility], [address], [place] [address]   Respiratory (~20 pathogens) panel by PCR     Status: None   Collection Time: 10/24/21  2:24 PM   Specimen: Nasopharyngeal Swab; Respiratory  Result Value Ref Range Status   Adenovirus NOT DETECTED NOT DETECTED Final   Coronavirus 229E NOT DETECTED NOT DETECTED Final    Comment: (NOTE) The Coronavirus on the Respiratory Panel, DOES NOT test for the novel  Coronavirus (2019 nCoV)    Coronavirus HKU1 NOT DETECTED NOT DETECTED Final   Coronavirus NL63 NOT DETECTED NOT DETECTED Final   Coronavirus OC43 NOT DETECTED NOT DETECTED Final   Metapneumovirus NOT DETECTED NOT DETECTED Final   Rhinovirus / Enterovirus NOT DETECTED NOT DETECTED Final   Influenza A NOT DETECTED NOT DETECTED Final   Influenza B NOT DETECTED NOT DETECTED Final   Parainfluenza Virus 1 NOT DETECTED NOT DETECTED Final   Parainfluenza Virus 2 NOT DETECTED NOT DETECTED Final   Parainfluenza Virus 3 NOT DETECTED NOT DETECTED Final   Parainfluenza Virus 4 NOT DETECTED NOT DETECTED Final   Respiratory Syncytial Virus NOT DETECTED NOT DETECTED Final   Bordetella pertussis NOT DETECTED  NOT DETECTED Final   Bordetella Parapertussis NOT DETECTED NOT DETECTED Final   Chlamydophila pneumoniae NOT DETECTED NOT DETECTED Final   Mycoplasma pneumoniae NOT DETECTED NOT DETECTED Final    Comment: Performed at [facility], [place]. [address], [place], [place] [address]    Coagulation Studies: No results for input(s): "LABPROT", "INR" in the last 72 hours.  Urinalysis: No results for input(s): "COLORURINE", "LABSPEC", "PHURINE", "GLUCOSEU", "HGBUR", "BILIRUBINUR", "KETONESUR", "PROTEINUR", "UROBILINOGEN", "NITRITE", "LEUKOCYTESUR" in the last 72 hours.  Invalid input(s): "APPERANCEUR"    Imaging: [place] LT UPPER EXTREM LTD SOFT TISSUE NON VASCULAR  Result Date: 11/19/2021 CLINICAL DATA:  Left arm pain, redness, and swelling. EXAM: ULTRASOUND LEFT UPPER EXTREMITY LIMITED TECHNIQUE: Ultrasound examination of the upper extremity soft tissues was performed in the area of clinical concern. COMPARISON:  None Available. FINDINGS: Focal ultrasound of the areas of concern in the left upper  arm and forearm demonstrate no discrete soft tissue mass or fluid collection. IMPRESSION: 1. Negative. No sonographic abnormality at the sites of the palpable abnormalities confirmed by the patient. Electronically Signed   By: Titus Dubin M.D.   On: 11/19/2021 13:35     Medications:      (feeding supplement) PROSource Plus  30 mL Oral BID BM   carvedilol  12.5 mg Oral QPM   Chlorhexidine Gluconate Cloth  6 each Topical Q0600   citalopram  10 mg Oral Daily   diphenhydrAMINE       epoetin alfa       epoetin (EPOGEN/PROCRIT) injection  4,000 Units Intravenous Q M,W,F-HD   furosemide  40 mg Oral Daily   heparin  5,000 Units Subcutaneous Q12H   heparin sodium (porcine)       multivitamin  1 tablet Oral QHS   ondansetron (ZOFRAN) IV  4 mg Intravenous Once   pantoprazole  40 mg Oral BID   psyllium  1 packet Oral q AM   sodium chloride flush  3 mL Intravenous Q12H   traZODone  50 mg Oral QHS    acetaminophen **OR** acetaminophen, albuterol, diphenhydrAMINE, epoetin alfa, guaiFENesin, heparin sodium (porcine), loperamide, ondansetron, oxyCODONE-acetaminophen, senna-docusate, tiZANidine, topiramate  Assessment/ Plan:  [name] is a 66 y.o.  female with past medical history of ESRD with hemodialysis TTS, diabetes mellitus, hypertension to the hospital seeking long term placement. She was recently admitted and received antibiotics for a dog bite.  She was discharged with a prescription for home antibiotics.  She states her caregivers were unable to retrieve antibiotics.  She states that there was an argument and that she is here seeking long-term placement.  Principal Problem:   Adjustment disorder with mixed anxiety and depressed mood Active Problems:   Hypertension   Type 2 diabetes mellitus (HCC)   ESRD (end stage renal disease) ([place])   Financial difficulty   Anemia in chronic kidney disease   Sepsis ([place])   Cellulitis   Bent bone fracture of ulna   Hospital acquired PNA   Fecal impaction (HCC)   Aortic stenosis   Left arm cellulitis   [facility]/MWF/right PermCath/83.5 kg  End Stage Renal Disease on hemodialysis:  Patient will receive scheduled dialysis treatment today, UF goal 1 to 1.5 L as tolerated.  Patient receiving treatment seated in recliner.  Next treatment scheduled for Wednesday.  Renal navigator continues to seek outpatient placement.  2. Hypertension with chronic kidney disease:   Patient remains on carvedilol and furosemide only.     BP (!) 107/53   Pulse 60   Temp 98.3 F (36.8 C) (Oral)   Resp 16   Ht 5\' 6"  (1.676 m)   Wt 74.7 kg   SpO2 100%   BMI 26.58 kg/m   3. Anemia of chronic kidney disease/ kidney injury/chronic disease/acute blood loss:   Lab Results  Component Value Date   HGB 9.3 (L) 11/15/2021  Hemoglobin remains at target Continue EPO with dialysis treatments.  4. Secondary Hyperparathyroidism:     Lab  Results  Component Value Date   PTH 70 (H) 05/12/2020   CALCIUM 9.0 11/16/2021   PHOS 3.3 11/16/2021   We will continue to monitor bone minerals during this admission.  5.  Severe constipation Managed with stool softeners.    LOS: [facility] 7/10/202312:32 PM

## 2021-11-20 NOTE — Progress Notes (Signed)
Occupational Therapy Treatment Patient Details Name: Katie [name] MRN: [id number] DOB: 1956/02/28 Today's Date: 11/20/2021   History of present illness Pt is a 66 y.o. female presenting to hospital 5/20 with concerns for worsening L hand pain d/t animal bite (pt recently admitted 5/13-5/17 for same reason). Pt's friend dropped her off at dialysis and refused to do any more (pt now homeless).  PMH includes ESRD on HD TTS, DM, htn, orthostatic hypotension, and h/o mandible surgery.   OT comments  Pt seen for OT tx this date. Pt reporting feeling a little better. With limited encouragement, pt agreeable to walking to the sink in room for grooming tasks instead of doing tasks EOB. Pt completed bed mobility with mod indep, stood with SBA-CGA, and ambulated to sink with CGA and no AD with intermittent UE support on countertop to sink. At sink she was able to manipulate toothpaste and toothbrush to brush her teeth in standing with SBA for safety/balance. Pt endorsing some fatigue and mild SOB after grooming, returned to bed with CGA. MD in room at end of session. Pt progressing towards goals, continues to be limited by poor activity tolerance, strength, and balance.    Recommendations for follow up therapy are one component of a multi-disciplinary discharge planning process, led by the attending physician.  Recommendations may be updated based on patient status, additional functional criteria and insurance authorization.    Follow Up Recommendations  Skilled nursing-short term rehab (<3 hours/day)    Assistance Recommended at Discharge Intermittent Supervision/Assistance  Patient can return home with the following  A little help with walking and/or transfers;A lot of help with bathing/dressing/bathroom;Assistance with cooking/housework;Assist for transportation   Equipment Recommendations  BSC/3in1    Recommendations for Other Services      Precautions / Restrictions Precautions Precautions:  Fall Precaution Comments: R IJ permcath Restrictions Weight Bearing Restrictions: No       Mobility Bed Mobility Overal bed mobility: Needs Assistance Bed Mobility: Supine to Sit, Sit to Supine     Supine to sit: Modified independent (Device/Increase time), HOB elevated Sit to supine: Modified independent (Device/Increase time), HOB elevated        Transfers Overall transfer level: Needs assistance Equipment used: None Transfers: Sit to/from Stand Sit to Stand: Min guard, Supervision           General transfer comment: SBA, pt endorsing feeling need to stand there for a moment and uses UE support on cabinet in front of her to steady herself endorsing mild lightheadedness upon initial stand, resolving within 11min     Balance Overall balance assessment: Needs assistance Sitting-balance support: Feet supported, No upper extremity supported Sitting balance-Leahy Scale: Good     Standing balance support: During functional activity, No upper extremity supported, Single extremity supported Standing balance-Leahy Scale: Good                             ADL either performed or assessed with clinical judgement   ADL Overall ADL's : Needs assistance/impaired     Grooming: Standing;Wash/dry hands;Oral care Grooming Details (indicate cue type and reason): Pt stood at the sink with SBA-CGA and intermittent UE support on the counter to brush her teeth/gums with no LOB noted but pt endorsing fatigue/slight SOB                                    Extremity/Trunk  Assessment              Vision       Perception     Praxis      Cognition Arousal/Alertness: Awake/alert Behavior During Therapy: WFL for tasks assessed/performed Overall Cognitive Status: Within Functional Limits for tasks assessed                                          Exercises      Shoulder Instructions       General Comments      Pertinent  Vitals/ Pain       Pain Assessment Pain Assessment: Faces Faces Pain Scale: Hurts a little bit Pain Location: LUE Pain Descriptors / Indicators: Discomfort Pain Intervention(s): Limited activity within patient's tolerance, Monitored during session  Home Living                                          Prior Functioning/Environment              Frequency  Min 2X/week        Progress Toward Goals  OT Goals(current goals can now be found in the care plan section)  Progress towards OT goals: Progressing toward goals  Acute Rehab OT Goals Patient Stated Goal: feel better and be more independent OT Goal Formulation: With patient Time For Goal Achievement: 11/27/21 Potential to Achieve Goals: Good  Plan Discharge plan remains appropriate;Frequency remains appropriate    Co-evaluation                 AM-PAC OT "6 Clicks" Daily Activity     Outcome Measure   Help from another person eating meals?: A Little Help from another person taking care of personal grooming?: A Little Help from another person toileting, which includes using toliet, bedpan, or urinal?: A Lot Help from another person bathing (including washing, rinsing, drying)?: A Lot Help from another person to put on and taking off regular upper body clothing?: A Little Help from another person to put on and taking off regular lower body clothing?: A Little 6 Click Score: 16    End of Session Equipment Utilized During Treatment: Gait belt  OT Visit Diagnosis: Unsteadiness on feet (R26.81) Pain - Right/Left: Left Pain - part of body: Arm   Activity Tolerance Patient tolerated treatment well   Patient Left in bed;with call bell/phone within reach;with bed alarm set;Other (comment) (MD in room)   Nurse Communication          Time: 3893-7342 OT Time Calculation (min): 10 min  Charges: OT General Charges $OT Visit: 1 Visit OT Treatments $Self Care/Home Management : 8-22  mins  Ardeth Perfect., MPH, MS, OTR/L ascom [phone] 11/20/21, 9:26 AM

## 2021-11-21 DIAGNOSIS — N186 End stage renal disease: Secondary | ICD-10-CM | POA: Diagnosis not present

## 2021-11-21 DIAGNOSIS — L03114 Cellulitis of left upper limb: Secondary | ICD-10-CM | POA: Diagnosis not present

## 2021-11-21 LAB — GLUCOSE, CAPILLARY
Glucose-Capillary: 88 mg/dL (ref 70–99)
Glucose-Capillary: 132 mg/dL — ABNORMAL HIGH (ref 70–99)

## 2021-11-21 NOTE — Progress Notes (Signed)
OT Cancellation Note  Patient Details Name: Katie [name] MRN: [id number] DOB: 1956/03/12   Cancelled Treatment:    Reason Eval/Treat Not Completed: Patient declined, no reason specified. Upon attempt, pt endorsing headache, unable to take pain meds yet to address it, and expresses frustration that her tv show just started and she's missed it the past 3 days. Will re-attempt at later date/time as pt is agreeable.   Ardeth Perfect., MPH, MS, OTR/L ascom [phone] 11/21/21, 2:22 PM

## 2021-11-21 NOTE — Progress Notes (Addendum)
Progress Note    Katie [name]  XBM:[id number] DOB: 1955/05/19  DOA: 09/30/2021 PCP: Pcp, No      Brief Narrative:    Medical records reviewed and are as summarized below:  Katie [name] is a 66 y.o. female with medical history significant of ESRD, DM II, homelessness. Had been admitted 05/13-05/17 for sepsis d/t cellulits from animal bite to arm, discharged on po abx and was unable to fill the Rx, was kicked out of where she was living, no way to get HD.  Patient has been in the ED since arriving the hospital.  She was admitted to the hospital again on 10/23/2021 as patient developed cough short of breath and fever.  Chest x-ray showed pneumonia.  Patient was diagnosed with sepsis and pneumonia, was started on cefepime and vancomycin. Had MRI performed on 6/14 did not show any osteomyelitis. Patient is currently homeless, has a severe weakness.  No discharge option at this time. 6/15.  Patient has no nausea, KUB showed fecal impaction with severe dilated colon, patient was started on senna as well as scheduled lactulose.  CT chest also showed dense right lower lobe consolidation consistent with aspiration pneumonia.  CT of the left shoulder did not show any osteomyelitis or septic arthritis. Fecal impaction resolved after [place] on 6/17. Patient condition has stabilized, pending nursing home placement. 6/29.  Patient had a significant left upper extremity swelling, duplex ultrasound did not show DVT.  She completed a 10-day course of doxycycline and Keflex.  Ultrasound of the left upper extremity soft tissue did not show any evidence of abscess.        Assessment/Plan:   Principal Problem:   Adjustment disorder with mixed anxiety and depressed mood Active Problems:   Cellulitis   Hypertension   Type 2 diabetes mellitus (HCC)   ESRD (end stage renal disease) (HCC)   Financial difficulty   Anemia in chronic kidney disease   Sepsis ([place])   Bent bone fracture of ulna    Hospital acquired PNA   Fecal impaction (HCC)   Aortic stenosis   Left arm cellulitis   Nutrition Problem: Increased nutrient needs Etiology: catabolic illness (ESRD on HD)  Signs/Symptoms: estimated needs   Body mass index is 26.97 kg/m.  Left upper extremity cellulitis: Completed 10-day course of doxycycline and Keflex on 11/18/2021.  No evidence of DVT in the left upper extremity.  Ultrasound of the left upper extremity did not show any abscess.  Use warm compresses on left forearm as needed for discomfort/swelling  Vomiting and diarrhea: Improved.  Amitiza and lactulose were discontinued on 11/15/2021.   ESRD on HD: She is on MWF schedule.  Follow-up with nephrologist for hemodialysis.  Recent sepsis from pneumonia: Completed antibiotics for that  Constipation, fecal impaction, hypokalemia: Improved.  Patient requested Metamucil every morning for regular bowel movement.  Other comorbidities include mild aortic stenosis, probable depression, type II DM with occasional hypoglycemia, anemia of chronic kidney disease  Trazodone as needed for sleep  Diet Order             Diet renal with fluid restriction Fluid restriction: 1200 mL Fluid; Room service appropriate? Yes; Fluid consistency: Thin  Diet effective now                            Consultants: Psychiatrist, nephrologist, gastroenterologist, orthopedic surgeon, infectious disease  Procedures: None    Medications:    (feeding supplement) PROSource Plus  30 mL Oral BID BM   carvedilol  12.5 mg Oral QPM   Chlorhexidine Gluconate Cloth  6 each Topical Q0600   citalopram  10 mg Oral Daily   epoetin (EPOGEN/PROCRIT) injection  4,000 Units Intravenous Q M,W,F-HD   furosemide  40 mg Oral Daily   heparin  5,000 Units Subcutaneous Q12H   multivitamin  1 tablet Oral QHS   ondansetron (ZOFRAN) IV  4 mg Intravenous Once   pantoprazole  40 mg Oral BID   psyllium  1 packet Oral q AM   sodium chloride flush  3  mL Intravenous Q12H   traZODone  50 mg Oral QHS   Continuous Infusions:   Anti-infectives (From admission, onward)    Start     Dose/Rate Route Frequency Ordered Stop   11/09/21 1215  doxycycline (VIBRA-TABS) tablet 100 mg        100 mg Oral Every 12 hours 11/09/21 1122 11/19/21 0959   11/09/21 1215  cephALEXin (KEFLEX) capsule 500 mg        500 mg Oral Every 12 hours 11/09/21 1123 11/19/21 0959   10/24/21 1200  vancomycin (VANCOCIN) IVPB 1000 mg/200 mL premix  Status:  Discontinued        1,000 mg 200 mL/hr over 60 Minutes Intravenous Every T-Th-Sa (Hemodialysis) 10/23/21 2017 10/27/21 1303   10/24/21 1200  ceFEPIme (MAXIPIME) 2 g in sodium chloride 0.9 % 100 mL IVPB        2 g 200 mL/hr over 30 Minutes Intravenous Every T-Th-Sa (Hemodialysis) 10/23/21 2017 10/29/21 2359   10/23/21 1615  vancomycin (VANCOREADY) IVPB 1750 mg/350 mL        1,750 mg 175 mL/hr over 120 Minutes Intravenous  Once 10/23/21 1614 10/23/21 1906   10/23/21 1530  vancomycin (VANCOCIN) IVPB 1000 mg/200 mL premix  Status:  Discontinued        1,000 mg 200 mL/hr over 60 Minutes Intravenous  Once 10/23/21 1526 10/23/21 1614   10/23/21 1530  ceFEPIme (MAXIPIME) 2 g in sodium chloride 0.9 % 100 mL IVPB        2 g 200 mL/hr over 30 Minutes Intravenous  Once 10/23/21 1526 10/23/21 1703   10/02/21 2200  amoxicillin-clavulanate (AUGMENTIN) 500-125 MG per tablet 500 mg  Status:  Discontinued        1 tablet Oral Every 12 hours 10/02/21 1454 10/13/21 1151   09/30/21 2200  amoxicillin-clavulanate (AUGMENTIN) 875-125 MG per tablet 1 tablet  Status:  Discontinued        1 tablet Oral Every 12 hours 09/30/21 1803 10/02/21 1454   09/30/21 1545  Ampicillin-Sulbactam (UNASYN) 3 g in sodium chloride 0.9 % 100 mL IVPB        3 g 200 mL/hr over 30 Minutes Intravenous  Once 09/30/21 1530 09/30/21 1629              Family Communication/Anticipated D/C date and plan/Code Status   DVT prophylaxis: heparin injection 5,000  Units Start: 10/23/21 2330     Code Status: Full Code  Family Communication: None Disposition Plan: Awaiting placement   Status is: Inpatient Remains inpatient appropriate because: Awaiting placement   Subjective:   No new complaints.  Pain in the right upper extremity is slowly improving.  Objective:    Vitals:   11/20/21 1952 11/21/21 0500 11/21/21 0504 11/21/21 0751  BP: (!) 121/53  139/68 (!) 102/52  Pulse: 69  70 69  Resp: 16  18 18   Temp: 98.6 F (37 C)  98.4  F (36.9 C) 98.8 F (37.1 C)  TempSrc:      SpO2: 96%  96% 96%  Weight:  75.8 kg    Height:       No data found.   Intake/Output Summary (Last 24 hours) at 11/21/2021 1334 Last data filed at 11/21/2021 0900 Gross per 24 hour  Intake 120 ml  Output 1002 ml  Net -882 ml   Filed Weights   11/20/21 1033 11/20/21 1358 11/21/21 0500  Weight: 74.7 kg 73.7 kg 75.8 kg    Exam:  GEN: NAD SKIN: No rash EYES: EOMI ENT: MMM CV: RRR, systolic murmur loudest in the aortic area PULM: CTA B ABD: soft, obese, NT, +BS CNS: AAO x 3, non focal EXT: Erythema, tenderness and swelling of left forearm is slowly improving.           Data Reviewed:   I have personally reviewed following labs and imaging studies:  Labs: Labs show the following:   Basic Metabolic Panel: Recent Labs  Lab 11/15/21 0940 11/16/21 0844  NA 140 137  K 3.7 4.0  CL 103 102  CO2 27 28  GLUCOSE 128* 111*  BUN 28* 16  CREATININE 5.86* 3.99*  CALCIUM 8.6* 9.0  MG  --  1.9  PHOS 3.8 3.3   GFR Estimated Creatinine Clearance: 15 mL/min (A) (by C-G formula based on SCr of 3.99 mg/dL (H)). Liver Function Tests: Recent Labs  Lab 11/15/21 0940  ALBUMIN 2.7*   No results for input(s): "LIPASE", "AMYLASE" in the last 168 hours. No results for input(s): "AMMONIA" in the last 168 hours. Coagulation profile No results for input(s): "INR", "PROTIME" in the last 168 hours.  CBC: Recent Labs  Lab 11/15/21 0940  WBC 6.3   HGB 9.3*  HCT 31.1*  MCV 97.8  PLT 282   Cardiac Enzymes: No results for input(s): "CKTOTAL", "CKMB", "CKMBINDEX", "TROPONINI" in the last 168 hours. BNP (last 3 results) No results for input(s): "PROBNP" in the last 8760 hours. CBG: Recent Labs  Lab 11/18/21 1654 11/19/21 0528 11/19/21 1746 11/20/21 0742 11/21/21 0820  GLUCAP 112* 90 94 85 88   D-Dimer: No results for input(s): "DDIMER" in the last 72 hours. Hgb A1c: No results for input(s): "HGBA1C" in the last 72 hours. Lipid Profile: No results for input(s): "CHOL", "HDL", "LDLCALC", "TRIG", "CHOLHDL", "LDLDIRECT" in the last 72 hours. Thyroid function studies: No results for input(s): "TSH", "T4TOTAL", "T3FREE", "THYROIDAB" in the last 72 hours.  Invalid input(s): "FREET3" Anemia work up: No results for input(s): "VITAMINB12", "FOLATE", "FERRITIN", "TIBC", "IRON", "RETICCTPCT" in the last 72 hours. Sepsis Labs: Recent Labs  Lab 11/15/21 0940  WBC 6.3    Microbiology No results found for this or any previous visit (from the past 240 hour(s)).  Procedures and diagnostic studies:  No results found.             LOS: 29 days   Boyd Copywriter, advertising on [email or web address]. If 7PM-7AM, please contact night-coverage at [email or web address]     11/21/2021, 1:34 PM

## 2021-11-21 NOTE — TOC Progression Note (Addendum)
Transition of Care [facility]) - Progression Note    Patient Details  Name: Katie [name] MRN: [id number] Date of Birth: 1955-11-20  Transition of Care [facility] CM/SW Contact  Eileen Stanford, LCSW Phone Number: 11/21/2021, 2:43 PM  Clinical Narrative:   CSW spoke with Anderson Malta at [facility] and they will accept pt. They are lining up everything for admit. They need fl2 and MD documentation. CSW will send. HD Coordinator Estill Bamberg updated.   They plan to admit pt on Thursday. MD updated.   Expected Discharge Plan: Skilled Nursing Facility Barriers to Discharge: SNF Pending bed offer  Expected Discharge Plan and Services Expected Discharge Plan: [place]   Discharge Planning Services: CM Consult Post Acute Care Choice: [place] Living arrangements for the past 2 months: Single Family Home                 DME Arranged: N/A DME Agency: NA       HH Arranged: NA HH Agency: NA         Social Determinants of Health (SDOH) Interventions    Readmission Risk Interventions    09/27/2021    1:44 PM 09/26/2021   12:46 PM 03/08/2021    2:49 PM  Readmission Risk Prevention Plan  Transportation Screening  Complete Complete  Palliative Care Screening Not Applicable    Medication Review (RN Care Manager)  Complete Complete  HRI or Home Care Consult   Complete  SW Recovery Care/Counseling Consult  Complete   Palliative Care Screening  Not Applicable   [place]  Not Applicable Complete

## 2021-11-21 NOTE — Progress Notes (Signed)
Physical Therapy Treatment Patient Details Name: Katie [name] MRN: [id number] DOB: 06-Dec-1955 Today's Date: 11/21/2021   History of Present Illness Pt is a 66 y.o. female presenting to hospital 5/20 with concerns for worsening L hand pain d/t animal bite (pt recently admitted 5/13-5/17 for same reason). Pt's friend dropped her off at dialysis and refused to do any more (pt now homeless).  PMH includes ESRD on HD TTS, DM, htn, orthostatic hypotension, and h/o mandible surgery.    PT Comments    Pt in bed, resistant but agrees to gait with encouragement.  She is able to exit right side of bed but does take mod a today - grabs my hands and pulls up despite cues.  Steady in sitting.  She is able to stand with min a x 1 and progress gait 15' then 45' with RW and wheelchair follow for safety as dizziness and fatigue remain barriers.  She returns back to bed despite encouragement to remain up in chair for breakfast.     Recommendations for follow up therapy are one component of a multi-disciplinary discharge planning process, led by the attending physician.  Recommendations may be updated based on patient status, additional functional criteria and insurance authorization.  Follow Up Recommendations  Skilled nursing-short term rehab (<3 hours/day)     Assistance Recommended at Discharge Frequent or constant Supervision/Assistance  Patient can return home with the following A little help with walking and/or transfers;A little help with bathing/dressing/bathroom;Assistance with cooking/housework;Assist for transportation;Help with stairs or ramp for entrance;Direct supervision/assist for medications management   Equipment Recommendations       Recommendations for Other Services       Precautions / Restrictions Precautions Precautions: Fall Precaution Comments: R IJ permcath Restrictions Weight Bearing Restrictions: No     Mobility  Bed Mobility Overal bed mobility: Needs Assistance Bed  Mobility: Supine to Sit     Supine to sit: Mod assist     General bed mobility comments: up on R side of bed.  needs increased assist today    Transfers Overall transfer level: Needs assistance Equipment used: Rolling walker (2 wheels) Transfers: Sit to/from Stand Sit to Stand: Min assist                Ambulation/Gait Ambulation/Gait assistance: Min assist Gait Distance (Feet): 50 Feet Assistive device: Rolling walker (2 wheels)   Gait velocity: decreased     General Gait Details: 70' then 66' with wheelchair follow.  generally unstead and limited by dizziness and fatigue.   Stairs             Wheelchair Mobility    Modified Rankin (Stroke Patients Only)       Balance Overall balance assessment: Needs assistance Sitting-balance support: Feet supported, No upper extremity supported Sitting balance-Leahy Scale: Good     Standing balance support: During functional activity, No upper extremity supported, Single extremity supported Standing balance-Leahy Scale: Fair                              Cognition Arousal/Alertness: Awake/alert Behavior During Therapy: WFL for tasks assessed/performed Overall Cognitive Status: Within Functional Limits for tasks assessed                                          Exercises      General Comments  Pertinent Vitals/Pain Pain Assessment Pain Assessment: Faces Faces Pain Scale: Hurts little more Pain Location: LUE Pain Descriptors / Indicators: Discomfort Pain Intervention(s): Limited activity within patient's tolerance, Monitored during session    Home Living                          Prior Function            PT Goals (current goals can now be found in the care plan section) Progress towards PT goals: Progressing toward goals    Frequency    Min 2X/week      PT Plan Current plan remains appropriate    Co-evaluation              AM-PAC  PT "6 Clicks" Mobility   Outcome Measure  Help needed turning from your back to your side while in a flat bed without using bedrails?: None Help needed moving from lying on your back to sitting on the side of a flat bed without using bedrails?: A Little Help needed moving to and from a bed to a chair (including a wheelchair)?: A Little Help needed standing up from a chair using your arms (e.g., wheelchair or bedside chair)?: A Little Help needed to walk in hospital room?: A Little Help needed climbing 3-5 steps with a railing? : A Lot 6 Click Score: 18    End of Session Equipment Utilized During Treatment: Gait belt Activity Tolerance: Patient tolerated treatment well Patient left: in bed;with call bell/phone within reach Nurse Communication: Mobility status PT Visit Diagnosis: Other abnormalities of gait and mobility (R26.89);Unsteadiness on feet (R26.81);Muscle weakness (generalized) (M62.81);History of falling (Z91.81) Pain - Right/Left: Left     Time: 6153-7943 PT Time Calculation (min) (ACUTE ONLY): 15 min  Charges:  $Gait Training: 8-22 mins                   Chesley Noon, PTA 11/21/21, 11:21 AM

## 2021-11-21 NOTE — Progress Notes (Signed)
Occupational Therapy Treatment Patient Details Name: Katie [name] MRN: [id number] DOB: [birth year] Today's Date: 11/21/2021   History of present illness Pt is a 66 y.o. female presenting to hospital 5/20 with concerns for worsening L hand pain d/t animal bite (pt recently admitted 5/13-5/17 for same reason). Pt's friend dropped her off at dialysis and refused to do any more (pt now homeless).  PMH includes ESRD on HD TTS, DM, htn, orthostatic hypotension, and h/o mandible surgery.   OT comments  Pt seen for OT tx this date. On first attempt, pt declined. 2nd attempt with encouragement, pt agreeable, but declines any ADL mobility 2/2 having walked with PT earlier. Pt had a large [employer] box on the counter. Pt completed bed mobility with supervision to sit EOB and open packages of clothing she had received from her sister. She tolerated sitting EOB for >72min, using BUE to open bags (some L hand pain noted requiring brief rest breaks) and some assist required to set up duffle bag so she could place clothing items in the bag. RN notified of 6/10 back/LUE/headache pain. Pt SpO2 94-95% on room air throughout session. Pt requested to keep the nasal cannula close by in case she needed it. Pt progressing towards goals. Continues to benefit from skilled OT services.     Recommendations for follow up therapy are one component of a multi-disciplinary discharge planning process, led by the attending physician.  Recommendations may be updated based on patient status, additional functional criteria and insurance authorization.    Follow Up Recommendations  Skilled nursing-short term rehab (<3 hours/day)    Assistance Recommended at Discharge Intermittent Supervision/Assistance  Patient can return home with the following  A little help with walking and/or transfers;A lot of help with bathing/dressing/bathroom;Assistance with cooking/housework;Assist for transportation   Equipment Recommendations  BSC/3in1     Recommendations for Other Services      Precautions / Restrictions Precautions Precautions: Fall Precaution Comments: R IJ permcath Restrictions Weight Bearing Restrictions: No       Mobility Bed Mobility Overal bed mobility: Needs Assistance Bed Mobility: Supine to Sit, Sit to Supine     Supine to sit: Supervision Sit to supine: Supervision        Transfers                   General transfer comment: pt declined 2/2 walking with PT earlier     Balance Overall balance assessment: Needs assistance Sitting-balance support: Feet supported, No upper extremity supported Sitting balance-Leahy Scale: Good                                     ADL either performed or assessed with clinical judgement   ADL                                              Extremity/Trunk Assessment              Vision       Perception     Praxis      Cognition Arousal/Alertness: Awake/alert   Overall Cognitive Status: Within Functional Limits for tasks assessed  Exercises Other Exercises Other Exercises: Pt sat EOB to open packages from her sister, using BUE to open bags (some L hand pain noted requiring brief rest breaks) and some assist required to set up duffle bag so she could place clothing items in the bag. Tolerated sitting up despite pain.    Shoulder Instructions       General Comments      Pertinent Vitals/ Pain       Pain Assessment Pain Assessment: 0-10 Pain Score: 6  Pain Location: back, headache, LUE Pain Descriptors / Indicators: Aching Pain Intervention(s): Limited activity within patient's tolerance, Monitored during session, Patient requesting pain meds-RN notified  Home Living                                          Prior Functioning/Environment              Frequency  Min 2X/week        Progress Toward Goals  OT  Goals(current goals can now be found in the care plan section)  Progress towards OT goals: Progressing toward goals  Acute Rehab OT Goals Patient Stated Goal: feel better and be more independent OT Goal Formulation: With patient Time For Goal Achievement: 11/27/21 Potential to Achieve Goals: Good  Plan Discharge plan remains appropriate;Frequency remains appropriate    Co-evaluation                 AM-PAC OT "6 Clicks" Daily Activity     Outcome Measure   Help from another person eating meals?: A Little Help from another person taking care of personal grooming?: A Little Help from another person toileting, which includes using toliet, bedpan, or urinal?: A Lot Help from another person bathing (including washing, rinsing, drying)?: A Lot Help from another person to put on and taking off regular upper body clothing?: A Little Help from another person to put on and taking off regular lower body clothing?: A Little 6 Click Score: 16    End of Session    OT Visit Diagnosis: Unsteadiness on feet (R26.81) Pain - Right/Left: Left Pain - part of body: Arm   Activity Tolerance Patient tolerated treatment well   Patient Left in bed;with call bell/phone within reach   Nurse Communication Patient requests pain meds        Time: 7408-1448 OT Time Calculation (min): 23 min  Charges: OT General Charges $OT Visit: 1 Visit OT Treatments $Self Care/Home Management : 23-37 mins  Ardeth Perfect., MPH, MS, OTR/L ascom [phone] 11/21/21, 3:36 PM

## 2021-11-22 DIAGNOSIS — F4323 Adjustment disorder with mixed anxiety and depressed mood: Secondary | ICD-10-CM | POA: Diagnosis not present

## 2021-11-22 LAB — RENAL FUNCTION PANEL
Albumin: 2.8 g/dL — ABNORMAL LOW (ref 3.5–5.0)
Anion gap: 8 (ref 5–15)
BUN: 65 mg/dL — ABNORMAL HIGH (ref 8–23)
CO2: 27 mmol/L (ref 22–32)
Calcium: 8.5 mg/dL — ABNORMAL LOW (ref 8.9–10.3)
Chloride: 102 mmol/L (ref 98–111)
Creatinine, Ser: 6.46 mg/dL — ABNORMAL HIGH (ref 0.44–1.00)
GFR, Estimated: 7 mL/min — ABNORMAL LOW (ref >60)
Glucose, Bld: 103 mg/dL — ABNORMAL HIGH (ref 70–99)
Phosphorus: 5.4 mg/dL — ABNORMAL HIGH (ref 2.5–4.6)
Potassium: 4.1 mmol/L (ref 3.5–5.1)
Sodium: 137 mmol/L (ref 135–145)

## 2021-11-22 LAB — CBC
HCT: 33.9 % — ABNORMAL LOW (ref 36.0–46.0)
Hemoglobin: 10.3 g/dL — ABNORMAL LOW (ref 12.0–15.0)
MCH: 29.5 pg (ref 26.0–34.0)
MCHC: 30.4 g/dL (ref 30.0–36.0)
MCV: 97.1 fL (ref 80.0–100.0)
Platelets: 281 10*3/uL (ref 150–400)
RBC: 3.49 MIL/uL — ABNORMAL LOW (ref 3.87–5.11)
RDW: 17.2 % — ABNORMAL HIGH (ref 11.5–15.5)
WBC: 7.3 10*3/uL (ref 4.0–10.5)
nRBC: 0 % (ref 0.0–0.2)

## 2021-11-22 MED ORDER — HEPARIN SODIUM (PORCINE) 1000 UNIT/ML DIALYSIS: 1000.0000 [IU] | INTRAMUSCULAR | Status: DC | PRN

## 2021-11-22 MED ORDER — PENTAFLUOROPROP-TETRAFLUOROETH EX AERO: 1.0000 | INHALATION_SPRAY | CUTANEOUS | Status: DC | PRN

## 2021-11-22 MED ORDER — ALTEPLASE 2 MG IJ SOLR: 2.0000 mg | Freq: Once | INTRAMUSCULAR | Status: DC | PRN

## 2021-11-22 MED ORDER — LIDOCAINE HCL (PF) 1 % IJ SOLN: 5.0000 mL | INTRAMUSCULAR | Status: DC | PRN

## 2021-11-22 MED ORDER — PANTOPRAZOLE SODIUM 40 MG PO TBEC: 40.0000 mg | DELAYED_RELEASE_TABLET | Freq: Two times a day (BID) | ORAL | 0 refills | Status: DC

## 2021-11-22 MED ORDER — LIDOCAINE-PRILOCAINE 2.5-2.5 % EX CREA: 1.0000 | TOPICAL_CREAM | CUTANEOUS | Status: DC | PRN

## 2021-11-22 MED ORDER — CITALOPRAM HYDROBROMIDE 10 MG PO TABS: 10.0000 mg | ORAL_TABLET | Freq: Every day | ORAL | 0 refills | Status: DC

## 2021-11-22 MED ORDER — FUROSEMIDE 40 MG PO TABS: 40.0000 mg | ORAL_TABLET | Freq: Every day | ORAL | 0 refills | Status: DC

## 2021-11-22 MED ORDER — HEPARIN SODIUM (PORCINE) 1000 UNIT/ML IJ SOLN
INTRAMUSCULAR | Status: AC
  Filled 2021-11-22: qty 5

## 2021-11-22 MED ORDER — ANTICOAGULANT SODIUM CITRATE 4% (200MG/5ML) IV SOLN: 5.0000 mL | Status: DC | PRN

## 2021-11-22 MED ORDER — ONDANSETRON 4 MG PO TBDP: 4.0000 mg | ORAL_TABLET | Freq: Three times a day (TID) | ORAL | 0 refills | Status: DC | PRN

## 2021-11-22 MED ORDER — OXYCODONE-ACETAMINOPHEN 5-325 MG PO TABS: 1.0000 | ORAL_TABLET | Freq: Three times a day (TID) | ORAL | 0 refills | Status: DC | PRN

## 2021-11-22 MED ORDER — CHLORHEXIDINE GLUCONATE CLOTH 2 % EX PADS
6.0000 | MEDICATED_PAD | Freq: Every day | CUTANEOUS | Status: DC
  Administered 2021-11-22 – 2021-11-25 (×3): 6 via TOPICAL

## 2021-11-22 NOTE — Progress Notes (Signed)
Physical Therapy Treatment Patient Details Name: Katie [name] MRN: [id number] DOB: 02/17/1956 Today's Date: 11/22/2021   History of Present Illness Pt is a 66 y.o. female presenting to hospital 5/20 with concerns for worsening L hand pain d/t animal bite (pt recently admitted 5/13-5/17 for same reason). Pt's friend dropped her off at dialysis and refused to do any more (pt now homeless).  PMH includes ESRD on HD MWF, DM, htn, orthostatic hypotension, and h/o mandible surgery.    PT Comments    Pt was pleasant but needing some encouragement to participate during the session and put forth good effort throughout. Session limited due to symptomatic orthostatic vitals; BP in supine 91/49, lowest sitting 68/37, end of session supine 103/52 and no other adverse symptoms reported/observed. Pt able to compete bed mobility with extra time and effort. Was able to tolerate sitting EOB ~2-40min to complete seated therex before needing to lay down. Pt able to perform lateral scoots to [facility] with extra effort and min clearance. Pt will benefit from PT services in a SNF setting upon discharge to safely address deficits listed in patient problem list for decreased caregiver assistance and eventual return to PLOF.    Recommendations for follow up therapy are one component of a multi-disciplinary discharge planning process, led by the attending physician.  Recommendations may be updated based on patient status, additional functional criteria and insurance authorization.  Follow Up Recommendations  Skilled nursing-short term rehab (<3 hours/day)     Assistance Recommended at Discharge Frequent or constant Supervision/Assistance  Patient can return home with the following A little help with walking and/or transfers;A little help with bathing/dressing/bathroom;Assistance with cooking/housework;Assist for transportation;Help with stairs or ramp for entrance;Direct supervision/assist for medications management    Equipment Recommendations  Other (comment) (TBD)    Recommendations for Other Services       Precautions / Restrictions Precautions Precautions: Fall Precaution Comments: R IJ permcath Restrictions Weight Bearing Restrictions: No     Mobility  Bed Mobility Overal bed mobility: Needs Assistance Bed Mobility: Supine to Sit, Sit to Supine     Supine to sit: Supervision Sit to supine: Supervision   General bed mobility comments: extra time and effort to complete    Transfers Overall transfer level: Needs assistance                Lateral/Scoot Transfers: Min guard General transfer comment: did not stand this session due to symptomatic orthostatic vitals; able to perform lateral scoots to [facility]    Ambulation/Gait               General Gait Details: not attempted this session due to orthostatics   Stairs             Wheelchair Mobility    Modified Rankin (Stroke Patients Only)       Balance Overall balance assessment: Needs assistance Sitting-balance support: Feet supported, Bilateral upper extremity supported Sitting balance-Leahy Scale: Good Sitting balance - Comments: tends to keep head forward flexed for relief       Standing balance comment: stand not attempted this session                            Cognition Arousal/Alertness: Awake/alert Behavior During Therapy: WFL for tasks assessed/performed Overall Cognitive Status: Within Functional Limits for tasks assessed  Exercises General Exercises - Lower Extremity Ankle Circles/Pumps: Strengthening, Both, 10 reps Quad Sets: Strengthening, Both, 10 reps Gluteal Sets: Strengthening, Both, 10 reps Long Arc Quad: Strengthening, Both, 10 reps Hip Flexion/Marching: Seated, Strengthening, Both, 10 reps    General Comments        Pertinent Vitals/Pain Pain Assessment Pain Assessment: 0-10 Pain Score: 4  Pain  Location: LUE Pain Descriptors / Indicators: Aching, Discomfort Pain Intervention(s): Monitored during session, Premedicated before session, Repositioned    Home Living                          Prior Function            PT Goals (current goals can now be found in the care plan section) Progress towards PT goals: Progressing toward goals    Frequency    Min 2X/week      PT Plan Current plan remains appropriate    Co-evaluation              AM-PAC PT "6 Clicks" Mobility   Outcome Measure  Help needed turning from your back to your side while in a flat bed without using bedrails?: None Help needed moving from lying on your back to sitting on the side of a flat bed without using bedrails?: None Help needed moving to and from a bed to a chair (including a wheelchair)?: A Little Help needed standing up from a chair using your arms (e.g., wheelchair or bedside chair)?: A Little Help needed to walk in hospital room?: A Little Help needed climbing 3-5 steps with a railing? : A Lot 6 Click Score: 19    End of Session Equipment Utilized During Treatment: Gait belt Activity Tolerance: Other (comment) (limited due to orthostatic vitals) Patient left: in bed;with call bell/phone within reach Nurse Communication: Mobility status (RN and MD notified of orthostatic vitals) PT Visit Diagnosis: Other abnormalities of gait and mobility (R26.89);Unsteadiness on feet (R26.81);Muscle weakness (generalized) (M62.81);History of falling (Z91.81) Pain - Right/Left: Left Pain - part of body: Shoulder;Hand;Arm     Time: 0240-9735 PT Time Calculation (min) (ACUTE ONLY): 23 min  Charges:                        Turner Daniels, SPT  11/22/2021, 4:35 PM

## 2021-11-22 NOTE — Progress Notes (Signed)
Post HD Note   Time tx initiated: 0858 Time tx completed: 1204  HD treatment completed. Patient tolerated well. Fistula/Graft/HD catheter without signs and symptoms of complications. Patient transported back to the room, alert and orient and in no acute distress. Report given to bedside RN.   Total UF removed: 300 Medication given: none Post HD VS: T. 98.3, B/P 110/54, P 72, O2 100%, R 15  Post HD weight: 73.5kg   Christianne Dolin Dialysis Nurse

## 2021-11-22 NOTE — Progress Notes (Signed)
[facility]  Dialysis Note   Subjective:   Patient seen and evaluated during dialysis   HEMODIALYSIS FLOWSHEET:  Blood Flow Rate (mL/min): 400 mL/min Arterial Pressure (mmHg): -130 mmHg Venous Pressure (mmHg): 150 mmHg TMP (mmHg): 1 mmHg Ultrafiltration Rate (mL/min): 597 mL/min Dialysate Flow Rate (mL/min): 300 ml/min DO NOT ZOX:WRUEAVWUJWJX: 14 DO NOT BJY:NWGNFAOZHYQM: 14 Dialysis Fluid Bolus: Normal Saline Bolus Amount (mL): 250 mL  No complaints at this time Appears excited at the possibility of discharge to assisted living tomorrow  Objective:  Vital signs in last 24 hours:  Temp:  [97.9 F (36.6 C)-98.7 F (37.1 C)] 98.5 F (36.9 C) (07/12 0845) Pulse Rate:  [63-70] 70 (07/12 1000) Resp:  [12-23] 23 (07/12 1000) BP: (100-158)/(45-66) 101/54 (07/12 1000) SpO2:  [96 %-100 %] 100 % (07/12 1000) Weight:  [73.8 kg-75.6 kg] 73.8 kg (07/12 0846)  Weight change: 0.9 kg Filed Weights   11/21/21 0500 11/22/21 0333 11/22/21 0846  Weight: 75.8 kg 75.6 kg 73.8 kg    Intake/Output: I/O last 3 completed shifts: In: 360 [P.O.:360] Out: -    Intake/Output this shift:  No intake/output data recorded.  Physical Exam: General: NAD, seated in chair  Head: Normocephalic, atraumatic. Moist oral mucosal membranes  Eyes: Anicteric  Lungs:  Clear to auscultation, normal effort  Heart: Regular rate and rhythm  Abdomen:  Soft, nontender  Extremities: No peripheral edema.   Neurologic: Nonfocal, moving all four extremities  Skin: No lesions  Access: Right PermCath    Basic Metabolic Panel: Recent Labs  Lab 11/16/21 0844 11/22/21 0850  NA 137 137  K 4.0 4.1  CL 102 102  CO2 28 27  GLUCOSE 111* 103*  BUN 16 65*  CREATININE 3.99* 6.46*  CALCIUM 9.0 8.5*  MG 1.9  --   PHOS 3.3 5.4*     Liver Function Tests: Recent Labs  Lab 11/22/21 0850  ALBUMIN 2.8*    No results for input(s): "LIPASE", "AMYLASE" in the last 168 hours. No results for  input(s): "AMMONIA" in the last 168 hours.  CBC: Recent Labs  Lab 11/22/21 0850  WBC 7.3  HGB 10.3*  HCT 33.9*  MCV 97.1  PLT 281     Cardiac Enzymes: No results for input(s): "CKTOTAL", "CKMB", "CKMBINDEX", "TROPONINI" in the last 168 hours.  BNP: Invalid input(s): "POCBNP"  CBG: Recent Labs  Lab 11/19/21 0528 11/19/21 1746 11/20/21 0742 11/21/21 0820 11/21/21 1622  GLUCAP 90 94 85 88 132*     Microbiology: Results for orders placed or performed during the hospital encounter of 09/30/21  Resp Panel by RT-PCR (Flu A&B, Covid) Nasopharyngeal Swab     Status: None   Collection Time: 09/30/21  9:34 AM   Specimen: Nasopharyngeal Swab; Nasopharyngeal(NP) swabs in vial transport medium  Result Value Ref Range Status   SARS Coronavirus 2 by RT PCR NEGATIVE NEGATIVE Final    Comment: (NOTE) SARS-CoV-2 target nucleic acids are NOT DETECTED.  The SARS-CoV-2 RNA is generally detectable in upper respiratory specimens during the acute phase of infection. The lowest concentration of SARS-CoV-2 viral copies this assay can detect is 138 copies/mL. A negative result does not preclude SARS-Cov-2 infection and should not be used as the sole basis for treatment or other patient management decisions. A negative result may occur with  improper specimen collection/handling, submission of specimen other than nasopharyngeal swab, presence of viral mutation(s) within the areas targeted by this assay, and inadequate number of viral copies(<138 copies/mL). A negative result must be combined  with clinical observations, patient history, and epidemiological information. The expected result is Negative.  Fact Sheet for Patients:  EntrepreneurPulse.com.au  Fact Sheet for Healthcare Providers:  IncredibleEmployment.be  This test is no t yet approved or cleared by the [place] FDA and  has been authorized for detection and/or diagnosis of SARS-CoV-2  by FDA under an Emergency Use Authorization (EUA). This EUA will remain  in effect (meaning this test can be used) for the duration of the COVID-19 declaration under Section 564(b)(1) of the Act, 21 U.S.C.section 360bbb-3(b)(1), unless the authorization is terminated  or revoked sooner.       Influenza A by PCR NEGATIVE NEGATIVE Final   Influenza B by PCR NEGATIVE NEGATIVE Final    Comment: (NOTE) The Xpert Xpress SARS-CoV-2/FLU/RSV plus assay is intended as an aid in the diagnosis of influenza from Nasopharyngeal swab specimens and should not be used as a sole basis for treatment. Nasal washings and aspirates are unacceptable for Xpert Xpress SARS-CoV-2/FLU/RSV testing.  Fact Sheet for Patients: EntrepreneurPulse.com.au  Fact Sheet for Healthcare Providers: IncredibleEmployment.be  This test is not yet approved or cleared by the [place] FDA and has been authorized for detection and/or diagnosis of SARS-CoV-2 by FDA under an Emergency Use Authorization (EUA). This EUA will remain in effect (meaning this test can be used) for the duration of the COVID-19 declaration under Section 564(b)(1) of the Act, 21 U.S.C. section 360bbb-3(b)(1), unless the authorization is terminated or revoked.  Performed at [facility], [address] [place], [place] [address]   SARS Coronavirus 2 by RT PCR (hospital order, performed in [facility] hospital lab) *cepheid single result test* Anterior Nasal Swab     Status: None   Collection Time: 10/23/21  4:06 PM   Specimen: Anterior Nasal Swab  Result Value Ref Range Status   SARS Coronavirus 2 by RT PCR NEGATIVE NEGATIVE Final    Comment: (NOTE) SARS-CoV-2 target nucleic acids are NOT DETECTED.  The SARS-CoV-2 RNA is generally detectable in upper and lower respiratory specimens during the acute phase of infection. The lowest concentration of SARS-CoV-2 viral copies this assay can detect is  250 copies / mL. A negative result does not preclude SARS-CoV-2 infection and should not be used as the sole basis for treatment or other patient management decisions.  A negative result may occur with improper specimen collection / handling, submission of specimen other than nasopharyngeal swab, presence of viral mutation(s) within the areas targeted by this assay, and inadequate number of viral copies (<250 copies / mL). A negative result must be combined with clinical observations, patient history, and epidemiological information.  Fact Sheet for Patients:   https://www.patel.info/  Fact Sheet for Healthcare Providers: https://hall.com/  This test is not yet approved or  cleared by the [place] FDA and has been authorized for detection and/or diagnosis of SARS-CoV-2 by FDA under an Emergency Use Authorization (EUA).  This EUA will remain in effect (meaning this test can be used) for the duration of the COVID-19 declaration under Section 564(b)(1) of the Act, 21 U.S.C. section 360bbb-3(b)(1), unless the authorization is terminated or revoked sooner.  Performed at [facility], [address] [place], [place] [address]   Blood culture (routine x 2)     Status: None   Collection Time: 10/23/21  4:06 PM   Specimen: BLOOD  Result Value Ref Range Status   Specimen Description BLOOD BLOOD RIGHT HAND  Final   Special Requests   Final  BOTTLES DRAWN AEROBIC AND ANAEROBIC Blood Culture adequate volume   Culture   Final    NO GROWTH 5 DAYS Performed at [facility], [address] [place], [place] [address]    Report Status 10/28/2021 FINAL  Final  Culture, blood (Routine X 2) w Reflex to ID Panel     Status: None   Collection Time: 10/24/21 12:06 AM   Specimen: BLOOD  Result Value Ref Range Status   Specimen Description BLOOD LEFT HAND  Final   Special Requests   Final    BOTTLES DRAWN AEROBIC AND ANAEROBIC  Blood Culture adequate volume   Culture   Final    NO GROWTH 5 DAYS Performed at [facility], [address], [place], [place] [address]    Report Status 10/29/2021 FINAL  Final  MRSA Next Gen by PCR, Nasal     Status: None   Collection Time: 10/24/21  2:24 PM   Specimen: Nasal Mucosa; Nasal Swab  Result Value Ref Range Status   MRSA by PCR Next Gen NOT DETECTED NOT DETECTED Final    Comment: (NOTE) The GeneXpert MRSA Assay (FDA approved for NASAL specimens only), is one component of a comprehensive MRSA colonization surveillance program. It is not intended to diagnose MRSA infection nor to guide or monitor treatment for MRSA infections. Test performance is not FDA approved in patients less than 70 years old. Performed at [facility], [address], [place] [address]   Respiratory (~20 pathogens) panel by PCR     Status: None   Collection Time: 10/24/21  2:24 PM   Specimen: Nasopharyngeal Swab; Respiratory  Result Value Ref Range Status   Adenovirus NOT DETECTED NOT DETECTED Final   Coronavirus 229E NOT DETECTED NOT DETECTED Final    Comment: (NOTE) The Coronavirus on the Respiratory Panel, DOES NOT test for the novel  Coronavirus (2019 nCoV)    Coronavirus HKU1 NOT DETECTED NOT DETECTED Final   Coronavirus NL63 NOT DETECTED NOT DETECTED Final   Coronavirus OC43 NOT DETECTED NOT DETECTED Final   Metapneumovirus NOT DETECTED NOT DETECTED Final   Rhinovirus / Enterovirus NOT DETECTED NOT DETECTED Final   Influenza A NOT DETECTED NOT DETECTED Final   Influenza B NOT DETECTED NOT DETECTED Final   Parainfluenza Virus 1 NOT DETECTED NOT DETECTED Final   Parainfluenza Virus 2 NOT DETECTED NOT DETECTED Final   Parainfluenza Virus 3 NOT DETECTED NOT DETECTED Final   Parainfluenza Virus 4 NOT DETECTED NOT DETECTED Final   Respiratory Syncytial Virus NOT DETECTED NOT DETECTED Final   Bordetella pertussis NOT DETECTED NOT DETECTED Final   Bordetella  Parapertussis NOT DETECTED NOT DETECTED Final   Chlamydophila pneumoniae NOT DETECTED NOT DETECTED Final   Mycoplasma pneumoniae NOT DETECTED NOT DETECTED Final    Comment: Performed at [facility], [place]. [address], [place],  [address]    Coagulation Studies: No results for input(s): "LABPROT", "INR" in the last 72 hours.  Urinalysis: No results for input(s): "COLORURINE", "LABSPEC", "PHURINE", "GLUCOSEU", "HGBUR", "BILIRUBINUR", "KETONESUR", "PROTEINUR", "UROBILINOGEN", "NITRITE", "LEUKOCYTESUR" in the last 72 hours.  Invalid input(s): "APPERANCEUR"    Imaging: No results found.   Medications:    anticoagulant sodium citrate       (feeding supplement) PROSource Plus  30 mL Oral BID BM   carvedilol  12.5 mg Oral QPM   Chlorhexidine Gluconate Cloth  6 each Topical Q0600   Chlorhexidine Gluconate Cloth  6 each Topical Q0600   citalopram  10  mg Oral Daily   epoetin (EPOGEN/PROCRIT) injection  4,000 Units Intravenous Q M,W,F-HD   furosemide  40 mg Oral Daily   heparin  5,000 Units Subcutaneous Q12H   multivitamin  1 tablet Oral QHS   ondansetron (ZOFRAN) IV  4 mg Intravenous Once   pantoprazole  40 mg Oral BID   psyllium  1 packet Oral q AM   traZODone  50 mg Oral QHS   acetaminophen **OR** acetaminophen, albuterol, alteplase, anticoagulant sodium citrate, guaiFENesin, heparin, lidocaine (PF), lidocaine-prilocaine, loperamide, ondansetron, oxyCODONE-acetaminophen, pentafluoroprop-tetrafluoroeth, senna-docusate, tiZANidine, topiramate  Assessment/ Plan:  [name] is a 66 y.o.  female with past medical history of ESRD with hemodialysis TTS, diabetes mellitus, hypertension to the hospital seeking long term placement. She was recently admitted and received antibiotics for a dog bite.  She was discharged with a prescription for home antibiotics.  She states her caregivers were unable to retrieve antibiotics.  She states that there was an argument and that she is  here seeking long-term placement.  Principal Problem:   Adjustment disorder with mixed anxiety and depressed mood Active Problems:   Hypertension   Type 2 diabetes mellitus (HCC)   ESRD (end stage renal disease) ([place])   Financial difficulty   Anemia in chronic kidney disease   Sepsis ([place])   Cellulitis   Bent bone fracture of ulna   Hospital acquired PNA   Fecal impaction (HCC)   Aortic stenosis   Left arm cellulitis   [facility]/MWF/right PermCath/83.5 kg  End Stage Renal Disease on hemodialysis:  Patient currently receiving treatment, seated in chair.  Tolerating treatment well at this time.  UF goal 1 L as tolerated.  Will obtain suggested dry weight after this treatment.  Discharge plan to include assisted living, [facility].  Projected discharge date of tomorrow.  Verifying that rehab will accommodate outpatient dialysis schedule is TTS.  2. Hypertension with chronic kidney disease:   Patient remains on carvedilol and furosemide only.   Blood pressure 106/61 during dialysis   BP (!) 101/54   Pulse 70   Temp 98.5 F (36.9 C)   Resp (!) 23   Ht 5\' 6"  (1.676 m)   Wt 73.8 kg   SpO2 100%   BMI 26.26 kg/m   3. Anemia of chronic kidney disease/ kidney injury/chronic disease/acute blood loss:   Lab Results  Component Value Date   HGB 10.3 (L) 11/22/2021  Hemoglobin remains at target We will hold EPO at this time.  4. Secondary Hyperparathyroidism:     Lab Results  Component Value Date   PTH 70 (H) 05/12/2020   CALCIUM 8.5 (L) 11/22/2021   PHOS 5.4 (H) 11/22/2021   Calcium and phosphorus remain within acceptable target.  We will continue to monitor.  5.  Severe constipation Managed with stool softeners.    LOS: [facility] 7/12/202310:16 AM

## 2021-11-22 NOTE — Discharge Summary (Signed)
Physician Discharge Summary  Katie [name] VEL:[id number] DOB: [birth year] DOA: 09/30/2021  PCP: Pcp, No  Admit date: 09/30/2021 Discharge date: 11/23/2021  Admitted From: Home Disposition: Assisted living facility  Recommendations for Outpatient Follow-up:  Continue outpatient hemodialysis as a scheduled  Home Health: N/A Equipment/Devices: N/A  Discharge Condition: Fair CODE STATUS: Full code Diet recommendation: Low-salt diet  Discharge summary: 66 year old with history of ESRD on hemodialysis, type 2 diabetes, homelessness admitted on 10/23/2021 with cough and shortness of breath and fever and chest x-ray consistent with pneumonia.  She was admitted and diagnosed with sepsis and pneumonia, treated with antibiotics.  Patient is currently homeless, she had severe weakness.  Remained in the hospital due to not having safe disposition.  Receiving dialysis on her schedule. After 54 days in the hospital, she is going to be discharged to an assisted living facility and continue medical care and dialysis.  ESRD on hemodialysis: Receiving dialysis on schedule.  Outpatient dialysis is schedule as per orders.  Upper extremity cellulitis/sepsis from pneumonia improved  Fecal impaction constipation: Improved.  Keep on a scheduled stool softeners and laxatives.  Also on Metamucil.  Essential hypertension: Tolerating carvedilol and Lasix.  She does get intradialytic hypotension, orthostatic precautions on the day of dialysis.  Type 2 diabetes: Diet controlled.  Does not need any medications.  Anxiety and depression: Using trazodone at night for sleep, using Celexa for anxiety.  Symptoms well controlled.  Back pain and arm pain: Using low-dose Percocet and Zanaflex that will be prescribed.  Patient is stable to discharge to supervised level of care.  Addendum, 11/23/2021.  Patient seen and examined.  Chart reviewed and this discharge summary was updated to reflect plan of care.  No changes  necessary.   Discharge Diagnoses:  Principal Problem:   Adjustment disorder with mixed anxiety and depressed mood Active Problems:   Cellulitis   Hypertension   Type 2 diabetes mellitus (HCC)   ESRD (end stage renal disease) (HCC)   Financial difficulty   Anemia in chronic kidney disease   Sepsis ([place])   Bent bone fracture of ulna   Hospital acquired PNA   Fecal impaction (HCC)   Aortic stenosis   Left arm cellulitis    Discharge Instructions  Discharge Instructions     Diet - low sodium heart healthy   Complete by: As directed    Increase activity slowly   Complete by: As directed    No wound care   Complete by: As directed    Place in ED Boarder Status   Complete by: As directed       Allergies as of 11/23/2021       Reactions   Penicillins Nausea And Vomiting   Pt states "only pills" make her vomit Other reaction(s): Nausea/Vomit        Medication List     STOP taking these medications    amLODipine 10 MG tablet Commonly known as: NORVASC   amoxicillin-clavulanate 250-125 MG tablet Commonly known as: AUGMENTIN   calcium acetate 667 MG capsule Commonly known as: PHOSLO   lactulose 10 GM/15ML solution Commonly known as: CHRONULAC   lidocaine 5 % Commonly known as: LIDODERM   lidocaine-prilocaine cream Commonly known as: EMLA   venlafaxine 37.5 MG tablet Commonly known as: EFFEXOR       TAKE these medications    acetaminophen 500 MG tablet Commonly known as: TYLENOL Take 500 mg by mouth every 6 (six) hours as needed for moderate pain.   carvedilol 12.5 MG  tablet Commonly known as: COREG Take 1 tablet (12.5 mg total) by mouth every evening.   citalopram 10 MG tablet Commonly known as: CELEXA Take 1 tablet (10 mg total) by mouth daily.   furosemide 40 MG tablet Commonly known as: LASIX Take 1 tablet (40 mg total) by mouth daily.   ondansetron 4 MG disintegrating tablet Commonly known as: ZOFRAN-ODT Take 1 tablet (4 mg total)  by mouth every 8 (eight) hours as needed for nausea or vomiting.   oxyCODONE-acetaminophen 5-325 MG tablet Commonly known as: PERCOCET/ROXICET Take 1 tablet by mouth every 8 (eight) hours as needed for moderate pain or severe pain. What changed:  how much to take when to take this   pantoprazole 40 MG tablet Commonly known as: PROTONIX Take 1 tablet (40 mg total) by mouth 2 (two) times daily.   senna-docusate 8.6-50 MG tablet Commonly known as: Senokot-S Take 1 tablet by mouth 2 (two) times daily.   tiZANidine 4 MG tablet Commonly known as: ZANAFLEX Take 4 mg by mouth 2 (two) times daily as needed.   topiramate 50 MG tablet Commonly known as: TOPAMAX Take 1 tablet (50 mg total) by mouth 2 (two) times daily as needed (headache).   traZODone 50 MG tablet Commonly known as: DESYREL Take 1 tablet (50 mg total) by mouth every evening.        Allergies  Allergen Reactions   Penicillins Nausea And Vomiting    Pt states "only pills" make her vomit Other reaction(s): Nausea/Vomit    Consultations: Multiple   Procedures/Studies: DG Chest Port 1 View  Addendum Date: 11/23/2021   ADDENDUM REPORT: 11/23/2021 12:17 ADDENDUM: Requested No radiographic evidence of TB identified. Electronically Signed   By: Lavonia Dana M.D.   On: 11/23/2021 12:17   Result Date: 11/23/2021 CLINICAL DATA:  Screening, chronic kidney disease, type II diabetes mellitus, hypertension EXAM: PORTABLE CHEST 1 VIEW COMPARISON:  Portable exam 1107 hours compared to 10/23/2021 FINDINGS: RIGHT jugular Port-A-Cath with tip projecting over cavoatrial junction. Enlargement of cardiac silhouette. Mediastinal contours and pulmonary vascularity normal. Atherosclerotic calcification aorta. Minimal bibasilar atelectasis. Lungs otherwise clear. No acute infiltrate, pleural effusion, or pneumothorax. No acute osseous findings. IMPRESSION: Minimal bibasilar atelectasis. Aortic Atherosclerosis (ICD10-I70.0). Electronically  Signed: By: Lavonia Dana M.D. On: 11/23/2021 11:18   [place] LT UPPER EXTREM LTD SOFT TISSUE NON VASCULAR  Result Date: 11/19/2021 CLINICAL DATA:  Left arm pain, redness, and swelling. EXAM: ULTRASOUND LEFT UPPER EXTREMITY LIMITED TECHNIQUE: Ultrasound examination of the upper extremity soft tissues was performed in the area of clinical concern. COMPARISON:  None Available. FINDINGS: Focal ultrasound of the areas of concern in the left upper arm and forearm demonstrate no discrete soft tissue mass or fluid collection. IMPRESSION: 1. Negative. No sonographic abnormality at the sites of the palpable abnormalities confirmed by the patient. Electronically Signed   By: Titus Dubin M.D.   On: 11/19/2021 13:35   US Venous Img Upper Uni Left (DVT)  Result Date: 11/08/2021 CLINICAL DATA:  Left upper extremity pain and edema. Patient is anticoagulation. Evaluate for DVT. End-stage renal disease with right jugular approach dialysis catheter. EXAM: LEFT UPPER EXTREMITY VENOUS DOPPLER ULTRASOUND TECHNIQUE: Gray-scale sonography with graded compression, as well as color Doppler and duplex ultrasound were performed to evaluate the upper extremity deep venous system from the level of the subclavian vein and including the jugular, axillary, basilic, radial, ulnar and upper cephalic vein. Spectral Doppler was utilized to evaluate flow at rest and with distal augmentation maneuvers. COMPARISON:  None Available. FINDINGS: Contralateral Subclavian Vein: Respiratory phasicity is normal and symmetric with the symptomatic side. No evidence of thrombus. Normal compressibility. Internal Jugular Vein: No evidence of thrombus. Normal compressibility, respiratory phasicity and response to augmentation. Subclavian Vein: No evidence of thrombus. Normal compressibility, respiratory phasicity and response to augmentation. Axillary Vein: No evidence of thrombus. Normal compressibility, respiratory phasicity and response to augmentation.  Cephalic Vein: No evidence of thrombus. Normal compressibility, respiratory phasicity and response to augmentation. Basilic Vein: No evidence of thrombus. Normal compressibility, respiratory phasicity and response to augmentation. Brachial Veins: No evidence of thrombus. Normal compressibility, respiratory phasicity and response to augmentation. Radial Veins: No evidence of thrombus. Normal compressibility, respiratory phasicity and response to augmentation. Ulnar Veins: No evidence of thrombus. Normal compressibility, respiratory phasicity and response to augmentation. Other Findings: There is a minimal amount of subcutaneous edema at the level forearm. IMPRESSION: No evidence of DVT within the left upper extremity. Electronically Signed   By: Sandi Mariscal M.D.   On: 11/08/2021 16:32   DG Swallowing Func-Speech Pathology  Result Date: 11/03/2021 Table formatting from the original result was not included. Objective Swallowing Evaluation: Type of Study: MBS-Modified Barium Swallow Study  Patient Details Name: Clydell Alberts MRN: [id number] Date of Birth: December 25, 1955 Today's Date: 11/03/2021 Time: SLP Start Time (ACUTE ONLY): 1115 -SLP Stop Time (ACUTE ONLY): 1215 SLP Time Calculation (min) (ACUTE ONLY): 60 min Past Medical History: Past Medical History: Diagnosis Date  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[place])   Hypertension   Renal disorder   Type 2 diabetes mellitus ([place])  Past Surgical History: Past Surgical History: Procedure Laterality Date  APPENDECTOMY    AV FISTULA PLACEMENT Left 03/16/2021  Procedure: INSERTION OF ARTERIOVENOUS (AV) GORE-TEX GRAFT ARM;  Surgeon: Algernon Huxley, MD;  Location: [facility] ORS;  Service: Vascular;  Laterality: Left;  DIALYSIS/PERMA CATHETER INSERTION N/A 05/16/2020  Procedure: DIALYSIS/PERMA CATHETER INSERTION;  Surgeon: Algernon Huxley, MD;  Location: [place] [facility];  Service: Cardiovascular;  Laterality: N/A;  MANDIBLE SURGERY    RENAL BIOPSY Right   RIGHT HEART CATH N/A  05/10/2020  Procedure: RIGHT HEART CATH;  Surgeon: Nelva Bush, MD;  Location: [place] [facility];  Service: Cardiovascular;  Laterality: N/A;  TEMPORARY DIALYSIS CATHETER N/A 05/11/2020  Procedure: TEMPORARY DIALYSIS CATHETER;  Surgeon: Algernon Huxley, MD;  Location: [place] [facility];  Service: Cardiovascular;  Laterality: N/A; HPI: Pt  is a 66 y.o. female with medical history significant of ESRD, HD, vision deficits reported by pt, DM II, Homelessness. Had been admitted 05/13-05/17 for sepsis d/t cellulits from animal bite to arm, discharged on po abx and was unable to fill the Rx, was kicked out of where she was living, no way to get HD.  Patient has been in the ED since arriving the hospital.  She was admitted to the hospital again on 10/23/2021 as patient developed cough short of breath and fever.  Chest x-ray showed pneumonia.  Patient was diagnosed with sepsis and pneumonia, was started on cefepime and vancomycin.  Had MRI performed on 6/14 did not show any osteomyelitis.  Patient condition appears to be improving.  Patient has been seen by ID, currently on vancomycin and cefepime.  Patient no longer has any fever, blood culture has been negative.   Patient is currently homeless, has a severe weakness.  No discharge option at this time.   CXR on 10/23/21: bibasilar airspace disease, left greater than right, with  persistent small pleural effusions.  Subjective: pt was verbal and talkative. Pleasant and followed instructions. Vision deficits reported.  Recommendations for follow up therapy are one component of a multi-disciplinary discharge planning process, led by the attending physician.  Recommendations may be updated based on patient status, additional functional criteria and insurance authorization. Assessment / Plan / Recommendation   11/01/2021   2:00 PM Clinical Impressions SLP Visit Diagnosis Dysphagia, unspecified (R13.10) Impact on safety and function --     11/01/2021   2:00 PM Treatment  Recommendations Treatment Recommendations No treatment recommended at this time     11/01/2021   2:00 PM Prognosis Prognosis for Safe Diet Advancement Good Barriers to Reach Goals Time post onset;Severity of deficits   11/01/2021   2:00 PM Diet Recommendations SLP Diet Recommendations Regular solids;Thin liquid Liquid Administration via Cup;Straw Medication Administration Whole meds with liquid Compensations Minimize environmental distractions;Slow rate;Small sips/bites;Lingual sweep for clearance of pocketing;Follow solids with liquid Postural Changes Remain semi-upright after after feeds/meals (Comment);Seated upright at 90 degrees     11/01/2021   2:00 PM Other Recommendations Recommended Consults Consider GI evaluation;Consider esophageal assessment Oral Care Recommendations Oral care BID;Oral care before and after PO;Patient independent with oral care Other Recommendations -- Follow Up Recommendations No SLP follow up Assistance recommended at discharge PRN Functional Status Assessment Patient has not had a recent decline in their functional status   11/01/2021   2:00 PM Frequency and Duration  Speech Therapy Frequency (ACUTE ONLY) -- Treatment Duration --     11/01/2021   2:00 PM Oral Phase Oral Phase [facility]    11/01/2021   2:00 PM Pharyngeal Phase Pharyngeal Phase [facility]    11/01/2021   2:00 PM Cervical Esophageal Phase  Cervical Esophageal Phase [facility] Happi Overton 11/03/2021, 2:29 PM                     DG Abd 1 View  Result Date: 10/29/2021 CLINICAL DATA:  Fecal impaction with abdominal pain and distension. EXAM: ABDOMEN - 1 VIEW COMPARISON:  10/26/2021 FINDINGS: Examination demonstrates air throughout the colon with near complete clearance of previously seen moderate fecal retention. No dilated bowel loops. No air-fluid levels or free peritoneal air. Slight improvement left base opacification partially visualized central venous catheter over the SVC. Remainder of the exam is unchanged. IMPRESSION: Nonobstructive  bowel gas pattern with near complete clearance of previously seen moderate fecal retention. Electronically Signed   By: Marin Olp M.D.   On: 10/29/2021 12:09   CT SHOULDER LEFT WO CONTRAST  Result Date: 10/26/2021 CLINICAL DATA:  Clinical suspicion for septic arthritis involving the left shoulder joint. EXAM: CT OF THE UPPER LEFT EXTREMITY WITHOUT CONTRAST TECHNIQUE: Multidetector CT imaging of the upper left extremity was performed according to the standard protocol. RADIATION DOSE REDUCTION: This exam was performed according to the departmental dose-optimization program which includes automated exposure control, adjustment of the mA and/or kV according to patient size and/or use of iterative reconstruction technique. COMPARISON:  None Available. FINDINGS: The glenohumeral joint is maintained. I do not see any destructive bony changes to suggest septic arthritis. No obvious joint effusion or inflammatory changes involving the shoulder musculature. There is some subcutaneous inflammatory change along the posterior deltoid muscle without discrete fluid collection or hematoma. The [facility] joint is intact. Mild degenerative changes. The acromion is flat or type 1 in shape. There is undersurface spurring change. Grossly by CT the rotator cuff tendons are intact. There is calcific tendinopathy the involving the supraspinatus attachment site. The visualized left  ribs are intact and the visualized left lung is grossly clear. IMPRESSION: 1. No CT findings to suggest septic arthritis or osteomyelitis. 2. Mild subcutaneous inflammatory change along the posterior deltoid muscle without discrete fluid collection or hematoma. 3. Grossly by CT the rotator cuff tendons are intact. There is calcific tendinopathy involving the supraspinatus attachment site. Electronically Signed   By: Marijo Sanes M.D.   On: 10/26/2021 20:25   CT CHEST WO CONTRAST  Result Date: 10/26/2021 CLINICAL DATA:  Abnormal xray - lung nodule, >= 1 cm  EXAM: CT CHEST WITHOUT CONTRAST TECHNIQUE: Multidetector CT imaging of the chest was performed following the standard protocol without IV contrast. RADIATION DOSE REDUCTION: This exam was performed according to the departmental dose-optimization program which includes automated exposure control, adjustment of the mA and/or kV according to patient size and/or use of iterative reconstruction technique. COMPARISON:  12/12/2020 FINDINGS: Cardiovascular: Extensive multi-vessel coronary artery calcification. Calcification of the aortic valve leaflets noted. Moderate calcification of the mitral valve annulus. Global cardiac size within normal limits. No pericardial effusion. Central pulmonary arteries are of normal caliber. Mild atherosclerotic calcification within the thoracic aorta. No aortic aneurysm. Right internal jugular hemodialysis catheter tip noted within the superior right atrium. Mediastinum/Nodes: Multiple subcentimeter nodules and calcifications are seen within the thyroid gland, unlikely of clinical significance. No further follow-up is recommended for these lesions. There is shotty mediastinal adenopathy, possibly reactive in nature. No frankly pathologic thoracic adenopathy. The esophagus is unremarkable. Lungs/Pleura: There is dense right lower lobe consolidation, possibly related to acute infection or aspiration. Small right parapneumonic effusion. There is left lower lobe volume loss and traction bronchiectasis, likely the sequela of remote infection or inflammation. Small left pleural effusion is present. No pneumothorax. Central airways are widely patent. Upper Abdomen: Cholelithiasis without pericholecystic inflammatory change noted. No acute abnormality. Musculoskeletal: No acute bone abnormality. No lytic or blastic bone lesion. IMPRESSION: 1. Dense right lower lobe consolidation, possibly related to acute infection or aspiration. Small associated right parapneumonic effusion. 2. Small left pleural  effusion. 3. Extensive multi-vessel coronary artery calcification. 4. Cholelithiasis. Electronically Signed   By: Fidela Salisbury M.D.   On: 10/26/2021 19:43   DG Abd 1 View  Result Date: 10/26/2021 CLINICAL DATA:  Abdominal pain with history of CKD. EXAM: ABDOMEN - 1 VIEW COMPARISON:  March 18, 2021. FINDINGS: Moderate gastric distension. Large volume stool throughout the colon. Large volume stool in the rectum. 10 cm distension of the cecum. On limited assessment no acute skeletal findings. Edema in the fat of the LEFT and RIGHT flank. Findings of dense consolidative changes in the LEFT lung base. IMPRESSION: 1. Large volume stool throughout the colon and rectum. Correlate with signs of constipation with rectal fecal impaction. Note that the cecum is dilated up to 10 cm, beyond this level of distension there is risk for perforation. 2. Moderate gastric distension without small bowel dilation visible on today's study. 3. Findings of dense consolidative changes in the LEFT lung base, correlate with any signs of pneumonia though there was basilar collapse on the study of Sep 23, 2021 which persists and is been present as far back as July of 2022. Consider follow-up dedicated chest CT to exclude endobronchial lesion as a cause for chronic lobar collapse. These results will be called to the ordering clinician or representative by the Radiologist Assistant, and communication documented in the PACS or [employer]. Electronically Signed   By: Zetta Bills M.D.   On: 10/26/2021 16:11   MR HAND  LEFT W WO CONTRAST  Result Date: 10/26/2021 CLINICAL DATA:  Dog bite.  Concern for osteomyelitis EXAM: MRI OF THE LEFT HAND WITHOUT AND WITH CONTRAST TECHNIQUE: Multiplanar, multisequence MR imaging of the left hand was performed before and after the administration of intravenous contrast. CONTRAST:  19mL GADAVIST GADOBUTROL 1 MMOL/ML IV SOLN COMPARISON:  CT 10/23/2021, x-ray 09/30/2021 FINDINGS: Technical Note:  Despite efforts by the technologist and patient, motion artifact is present on today's exam and could not be eliminated. This reduces exam sensitivity and specificity. Bones/Joint/Cartilage No acute fracture. No dislocation. No focal site of bone destruction or erosion. No bone marrow edema or marrow replacement. Mild degenerative changes within the hand and wrist. No joint effusion or evidence of synovitis. Ligaments Collateral ligaments of the hand are intact. Muscles and Tendons Grossly intact flexor and extensor tendons without evidence of tendon tear or tenosynovitis. No intramuscular fluid collections. Soft tissues Dorsal subcutaneous edema along the ulnar aspect of the hand and wrist. No organized or rim enhancing fluid collections. IMPRESSION: 1. Limited exam. 2. Dorsal subcutaneous edema along the ulnar aspect of the hand and wrist suggesting cellulitis. No organized or rim enhancing fluid collections. 3. No acute osseous findings. Specifically, no evidence of osteomyelitis within the limitations of this exam. Electronically Signed   By: Davina Poke D.O.   On: 10/26/2021 08:24   (Echo, Carotid, EGD, Colonoscopy, ERCP)    Subjective: Seen and examined.  Excited to get out of the hospital.  Very appreciative of care.  Discharge Exam: Vitals:   11/23/21 0356 11/23/21 0741  BP: 111/67 (!) 119/52  Pulse: 63 66  Resp: 16 14  Temp: 98.5 F (36.9 C) 98.2 F (36.8 C)  SpO2: 96% 96%   Vitals:   11/22/21 1936 11/23/21 0356 11/23/21 0500 11/23/21 0741  BP: (!) 118/57 111/67  (!) 119/52  Pulse: 68 63  66  Resp: 16 16  14   Temp: 98.8 F (37.1 C) 98.5 F (36.9 C)  98.2 F (36.8 C)  TempSrc: Oral   Oral  SpO2: 96% 96%  96%  Weight:   77.6 kg   Height:        General: Pt is alert, awake, not in acute distress On room air.  Looks fairly comfortable. Permacath present right IJ. Cardiovascular: RRR, S1/S2 +, no rubs, no gallops Respiratory: CTA bilaterally, no wheezing, no  rhonchi Abdominal: Soft, NT, ND, bowel sounds + Extremities: no edema, no cyanosis    The results of significant diagnostics from this hospitalization (including imaging, microbiology, ancillary and laboratory) are listed below for reference.     Microbiology: No results found for this or any previous visit (from the past 240 hour(s)).   Labs: BNP (last 3 results) Recent Labs    03/07/21 1114  BNP 73.5   Basic Metabolic Panel: Recent Labs  Lab 11/22/21 0850  NA 137  K 4.1  CL 102  CO2 27  GLUCOSE 103*  BUN 65*  CREATININE 6.46*  CALCIUM 8.5*  PHOS 5.4*   Liver Function Tests: Recent Labs  Lab 11/22/21 0850  ALBUMIN 2.8*   No results for input(s): "LIPASE", "AMYLASE" in the last 168 hours. No results for input(s): "AMMONIA" in the last 168 hours. CBC: Recent Labs  Lab 11/22/21 0850  WBC 7.3  HGB 10.3*  HCT 33.9*  MCV 97.1  PLT 281   Cardiac Enzymes: No results for input(s): "CKTOTAL", "CKMB", "CKMBINDEX", "TROPONINI" in the last 168 hours. BNP: Invalid input(s): "POCBNP" CBG: Recent Labs  Lab  11/19/21 1746 11/20/21 0742 11/21/21 0820 11/21/21 1622 11/23/21 0741  GLUCAP 94 85 88 132* 94   D-Dimer No results for input(s): "DDIMER" in the last 72 hours. Hgb A1c No results for input(s): "HGBA1C" in the last 72 hours. Lipid Profile No results for input(s): "CHOL", "HDL", "LDLCALC", "TRIG", "CHOLHDL", "LDLDIRECT" in the last 72 hours. Thyroid function studies No results for input(s): "TSH", "T4TOTAL", "T3FREE", "THYROIDAB" in the last 72 hours.  Invalid input(s): "FREET3" Anemia work up No results for input(s): "VITAMINB12", "FOLATE", "FERRITIN", "TIBC", "IRON", "RETICCTPCT" in the last 72 hours. Urinalysis    Component Value Date/Time   COLORURINE YELLOW (A) 09/23/2021 2143   APPEARANCEUR HAZY (A) 09/23/2021 2143   LABSPEC 1.012 09/23/2021 2143   PHURINE 9.0 (H) 09/23/2021 2143   GLUCOSEU 150 (A) 09/23/2021 2143   HGBUR NEGATIVE  09/23/2021 2143   BILIRUBINUR NEGATIVE 09/23/2021 2143   KETONESUR 5 (A) 09/23/2021 2143   PROTEINUR 100 (A) 09/23/2021 2143   NITRITE NEGATIVE 09/23/2021 2143   LEUKOCYTESUR LARGE (A) 09/23/2021 2143   Sepsis Labs Recent Labs  Lab 11/22/21 0850  WBC 7.3   Microbiology No results found for this or any previous visit (from the past 240 hour(s)).   Time coordinating discharge: 35 minutes  SIGNED:   Barb Merino, MD  [facility] 11/23/2021, 2:10 PM

## 2021-11-23 ENCOUNTER — Inpatient Hospital Stay: Payer: Medicaid Other

## 2021-11-23 DIAGNOSIS — F4323 Adjustment disorder with mixed anxiety and depressed mood: Secondary | ICD-10-CM | POA: Diagnosis not present

## 2021-11-23 LAB — GLUCOSE, CAPILLARY: Glucose-Capillary: 94 mg/dL (ref 70–99)

## 2021-11-23 NOTE — Progress Notes (Signed)
PROGRESS NOTE    Katie [name]  ZYS:[id number] DOB: 04-24-1956 DOA: 09/30/2021 PCP: Pcp, No    Brief Narrative:  Prolonged hospitalization due to unsafe disposition.   Assessment & Plan:    All acute medical issues resolved.  Discharge summary addended and reviewed.  Stable to discharge to assisted living facility.   DVT prophylaxis: heparin injection 5,000 Units Start: 10/23/21 2330   Code Status: Full code Family Communication: None Disposition Plan: Status is: Inpatient Possible discharge today if bed is available.     Consultants:  Multiple  Procedures:  Multiple  Antimicrobials:  Completed   Subjective: Seen and examined.  No overnight events.  She had some drop in blood pressure immediately after dialysis but that improved.  Currently no evidence of orthostatic drop.  Eager to get out of the hospital.  Objective: Vitals:   11/22/21 1936 11/23/21 0356 11/23/21 0500 11/23/21 0741  BP: (!) 118/57 111/67  (!) 119/52  Pulse: 68 63  66  Resp: 16 16  14   Temp: 98.8 F (37.1 C) 98.5 F (36.9 C)  98.2 F (36.8 C)  TempSrc: Oral   Oral  SpO2: 96% 96%  96%  Weight:   77.6 kg   Height:        Intake/Output Summary (Last 24 hours) at 11/23/2021 1411 Last data filed at 11/23/2021 1029 Gross per 24 hour  Intake 360 ml  Output --  Net 360 ml   Filed Weights   11/22/21 0846 11/22/21 1204 11/23/21 0500  Weight: 73.8 kg 73.5 kg 77.6 kg    Examination:  Looks comfortable.  Eating breakfast.    Data Reviewed: I have personally reviewed following labs and imaging studies  CBC: Recent Labs  Lab 11/22/21 0850  WBC 7.3  HGB 10.3*  HCT 33.9*  MCV 97.1  PLT 932   Basic Metabolic Panel: Recent Labs  Lab 11/22/21 0850  NA 137  K 4.1  CL 102  CO2 27  GLUCOSE 103*  BUN 65*  CREATININE 6.46*  CALCIUM 8.5*  PHOS 5.4*   GFR: Estimated Creatinine Clearance: 9.4 mL/min (A) (by C-G formula based on SCr of 6.46 mg/dL (H)). Liver Function  Tests: Recent Labs  Lab 11/22/21 0850  ALBUMIN 2.8*   No results for input(s): "LIPASE", "AMYLASE" in the last 168 hours. No results for input(s): "AMMONIA" in the last 168 hours. Coagulation Profile: No results for input(s): "INR", "PROTIME" in the last 168 hours. Cardiac Enzymes: No results for input(s): "CKTOTAL", "CKMB", "CKMBINDEX", "TROPONINI" in the last 168 hours. BNP (last 3 results) No results for input(s): "PROBNP" in the last 8760 hours. HbA1C: No results for input(s): "HGBA1C" in the last 72 hours. CBG: Recent Labs  Lab 11/19/21 1746 11/20/21 0742 11/21/21 0820 11/21/21 1622 11/23/21 0741  GLUCAP 94 85 88 132* 94   Lipid Profile: No results for input(s): "CHOL", "HDL", "LDLCALC", "TRIG", "CHOLHDL", "LDLDIRECT" in the last 72 hours. Thyroid Function Tests: No results for input(s): "TSH", "T4TOTAL", "FREET4", "T3FREE", "THYROIDAB" in the last 72 hours. Anemia Panel: No results for input(s): "VITAMINB12", "FOLATE", "FERRITIN", "TIBC", "IRON", "RETICCTPCT" in the last 72 hours. Sepsis Labs: No results for input(s): "PROCALCITON", "LATICACIDVEN" in the last 168 hours.  No results found for this or any previous visit (from the past 240 hour(s)).       Radiology Studies: DG Chest Port 1 View  Addendum Date: 11/23/2021   ADDENDUM REPORT: 11/23/2021 12:17 ADDENDUM: Requested No radiographic evidence of TB identified. Electronically Signed   By: Elta [place]  Thornton Papas M.D.   On: 11/23/2021 12:17   Result Date: 11/23/2021 CLINICAL DATA:  Screening, chronic kidney disease, type II diabetes mellitus, hypertension EXAM: PORTABLE CHEST 1 VIEW COMPARISON:  Portable exam 1107 hours compared to 10/23/2021 FINDINGS: RIGHT jugular Port-A-Cath with tip projecting over cavoatrial junction. Enlargement of cardiac silhouette. Mediastinal contours and pulmonary vascularity normal. Atherosclerotic calcification aorta. Minimal bibasilar atelectasis. Lungs otherwise clear. No acute infiltrate,  pleural effusion, or pneumothorax. No acute osseous findings. IMPRESSION: Minimal bibasilar atelectasis. Aortic Atherosclerosis (ICD10-I70.0). Electronically Signed: By: Lavonia Dana M.D. On: 11/23/2021 11:18        Scheduled Meds:  (feeding supplement) PROSource Plus  30 mL Oral BID BM   carvedilol  12.5 mg Oral QPM   Chlorhexidine Gluconate Cloth  6 each Topical Q0600   Chlorhexidine Gluconate Cloth  6 each Topical Q0600   citalopram  10 mg Oral Daily   furosemide  40 mg Oral Daily   heparin  5,000 Units Subcutaneous Q12H   multivitamin  1 tablet Oral QHS   ondansetron (ZOFRAN) IV  4 mg Intravenous Once   pantoprazole  40 mg Oral BID   psyllium  1 packet Oral q AM   traZODone  50 mg Oral QHS   Continuous Infusions:   LOS: 31 days    Time spent: 25 minutes    Barb Merino, MD [facility] Pager [phone]

## 2021-11-23 NOTE — TOC Progression Note (Signed)
Transition of Care [facility]) - Progression Note    Patient Details  Name: Katie [name] MRN: [id number] Date of Birth: 18-Apr-1956  Transition of Care [facility]) CM/SW [place], RN Phone Number: 11/23/2021, 3:50 PM  Clinical Narrative:   RNCM spoke with [facility], who explained that they needed Chest Xray and special edition FL2.  Completed and sent at 1300.  At [place] called to follow up, [place] did not receive fax.  RNCM refaxed information to facility    Expected Discharge Plan: Skilled Nursing Facility Barriers to Discharge: SNF Pending bed offer  Expected Discharge Plan and Services Expected Discharge Plan: [place]   Discharge Planning Services: CM Consult Post Acute Care Choice: [place] Living arrangements for the past 2 months: Single Family Home Expected Discharge Date: 11/23/21               DME Arranged: N/A DME Agency: NA       HH Arranged: NA HH Agency: NA         Social Determinants of Health (SDOH) Interventions    Readmission Risk Interventions    09/27/2021    1:44 PM 09/26/2021   12:46 PM 03/08/2021    2:49 PM  Readmission Risk Prevention Plan  Transportation Screening  Complete Complete  Palliative Care Screening Not Applicable    Medication Review (RN Care Manager)  Complete Complete  HRI or Home Care Consult   Complete  SW Recovery Care/Counseling Consult  Complete   Palliative Care Screening  Not [place]  Not Applicable Complete

## 2021-11-23 NOTE — NC FL2 (Signed)
[place] LEVEL OF CARE SCREENING TOOL     IDENTIFICATION  Patient Name: Katie [name] Birthdate: Nov 10, 1955 Sex: female Admission Date (Current Location): 09/30/2021  [place] and [place] Number:  Engineering geologist and Address:  [facility], [address], [place], [place] [address]      Provider Number: [id number]  Attending Physician Name and Address:  Barb Merino, MD  Relative Name and Phone Number:       Current Level of Care: Hospital Recommended Level of Care: [place] Prior Approval Number:    Date Approved/Denied:   PASRR Number: [id number]  Discharge Plan: Other (Comment) (ALF)    Current Diagnoses: Patient Active Problem List   Diagnosis Date Noted   Left arm cellulitis 11/09/2021   Adjustment disorder with mixed anxiety and depressed mood 11/07/2021   Aortic stenosis 11/01/2021   Fecal impaction ([place]) 10/27/2021   Bent bone fracture of ulna 10/24/2021   Hospital acquired PNA 10/24/2021   Sepsis ([place]) 10/23/2021   Cellulitis 10/23/2021   Right flank pain 09/23/2021   Iron deficiency anemia, unspecified 03/27/2021   Allergy, unspecified, initial encounter 03/22/2021   Anaphylactic shock, unspecified, initial encounter 03/22/2021   Unspecified jaundice 03/15/2021   Allergy status to penicillin 03/14/2021   Anemia in chronic kidney disease 03/14/2021   Chronic diastolic (congestive) heart failure ([place]) 03/14/2021   Dependence on renal dialysis ([place]) 03/14/2021   Depression, unspecified 03/14/2021   Hypertensive heart and chronic kidney disease with heart failure and with stage 5 chronic kidney disease, or end stage renal disease ([place]) 03/14/2021   Personal history of nicotine dependence 03/14/2021   Secondary hyperparathyroidism of renal origin ([place]) 03/14/2021   Frequent falls 03/07/2021   Homelessness 03/07/2021   Financial difficulty 03/07/2021   ESRD (end stage renal disease) ([place])  12/23/2020   Persistent headaches 06/27/2020   ATN (acute tubular necrosis) ([place])    Pulmonary hypertension, unspecified (HCC)    Chronic kidney disease (CKD), stage IV (severe) (HCC)    Pleural effusion    Acute heart failure with preserved ejection fraction (HFpEF) ([place])    Pressure injury of skin 05/01/2020   Hyperkalemia    Volume overload 04/30/2020   Hypertension    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HCC)    Type 2 diabetes mellitus ([place])     Orientation RESPIRATION BLADDER Height & Weight     Self, Time, Place, Situation  Normal Continent Weight: 77.6 kg Height:  5\' 6"  (167.6 cm)  BEHAVIORAL SYMPTOMS/MOOD NEUROLOGICAL BOWEL NUTRITION STATUS      Continent Diet (renal with fluid restriction Fluid restriction: 1200 mL Fluid)  AMBULATORY STATUS COMMUNICATION OF NEEDS Skin   Limited Assist Verbally Other (Comment) (laceration on left finger from dog bite)                       Personal Care Assistance Level of Assistance  Dressing, Feeding, Bathing Bathing Assistance: Limited assistance Feeding assistance: Independent Dressing Assistance: Limited assistance     Functional Limitations Info  Sight, Hearing, Speech Sight Info: Adequate Hearing Info: Adequate Speech Info: Adequate    SPECIAL CARE FACTORS FREQUENCY  PT (By licensed PT), OT (By licensed OT)     PT Frequency: 2x OT Frequency: 2x            Contractures Contractures Info: Not present    Additional Factors Info  Code Status, Allergies Code Status Info: full code Allergies Info: penicillins  TAKE these medications     acetaminophen 500 MG tablet Commonly known as: TYLENOL Take 500 mg by mouth every 6 (six) hours as needed for moderate pain.    carvedilol 12.5 MG tablet Commonly known as: COREG Take 1 tablet (12.5 mg total) by mouth every evening.    citalopram 10 MG tablet Commonly known as: CELEXA Take 1 tablet (10 mg total) by mouth daily. Start taking on:  November 23, 2021    furosemide 40 MG tablet Commonly known as: LASIX Take 1 tablet (40 mg total) by mouth daily. Start taking on: November 23, 2021    ondansetron 4 MG disintegrating tablet Commonly known as: ZOFRAN-ODT Take 1 tablet (4 mg total) by mouth every 8 (eight) hours as needed for nausea or vomiting.    oxyCODONE-acetaminophen 5-325 MG tablet Commonly known as: PERCOCET/ROXICET Take 1 tablet by mouth every 8 (eight) hours as needed for moderate pain or severe pain. What changed:  how much to take when to take this    pantoprazole 40 MG tablet Commonly known as: PROTONIX Take 1 tablet (40 mg total) by mouth 2 (two) times daily.    senna-docusate 8.6-50 MG tablet Commonly known as: Senokot-S Take 1 tablet by mouth 2 (two) times daily.    tiZANidine 4 MG tablet Commonly known as: ZANAFLEX Take 4 mg by mouth 2 (two) times daily as needed.    topiramate 50 MG tablet Commonly known as: TOPAMAX Take 1 tablet (50 mg total) by mouth 2 (two) times daily as needed (headache).    traZODone 50 MG tablet Commonly known as: DESYREL Take 1 tablet (50 mg total) by mouth every evening.      Discharge Medications: Please see discharge summary for a list of discharge medications.  Relevant Imaging Results:  Relevant Lab Results:   Additional Information [id number]  Pete Pelt, RN

## 2021-11-24 DIAGNOSIS — F4323 Adjustment disorder with mixed anxiety and depressed mood: Secondary | ICD-10-CM | POA: Diagnosis not present

## 2021-11-24 LAB — GLUCOSE, CAPILLARY
Glucose-Capillary: 95 mg/dL (ref 70–99)
Glucose-Capillary: 79 mg/dL (ref 70–99)

## 2021-11-24 NOTE — TOC Progression Note (Addendum)
Transition of Care [facility]) - Progression Note    Patient Details  Name: Katie [name] MRN: [id number] Date of Birth: Feb 03, 1956  Transition of Care [facility]) CM/SW Contact  Laurena Slimmer, RN Phone Number: 11/24/2021, 12:41 PM  Clinical Narrative:    11:24am Spoke with Development worker, international aid of [employer], Warner Mccreedy. All information including chest ray result and the special edition FL2 was confirmed it was received by facility yesterday for anticipated discharge today. [facility] [place] stated patient was expected to be at their facility at 9 am. She added this  information was communicated to the assigned case manager from yesterday via text to her cell phone. Advised the previous case manager is not present today, and that information would not have been evident to this CM. Facility not agreeable for discharging today despite having needed information and adequate time for transportation to be arranged.Executive decision was made by facility patient would be accepted 7/17. Facility has requested patient be in the building by 10 am as medications have to be picked up by 1 pm.   11:41am  Nurse, MD, [facility] manager and unit supervisor notified for cancelled discharge and planned discharge for 7/17 by 10 am.   12:38pm MD notified per unit director's request to remove discharge order for today.  1:07pm Patient notified of discharge plan for Monday. Stated she was grateful for her care here.     Expected Discharge Plan: Skilled Nursing Facility Barriers to Discharge: SNF Pending bed offer  Expected Discharge Plan and Services Expected Discharge Plan: [place]   Discharge Planning Services: CM Consult Post Acute Care Choice: [place] Living arrangements for the past 2 months: Single Family Home Expected Discharge Date: 11/23/21               DME Arranged: N/A DME Agency: NA       HH Arranged: NA HH Agency: NA         Social Determinants of Health (SDOH)  Interventions    Readmission Risk Interventions    09/27/2021    1:44 PM 09/26/2021   12:46 PM 03/08/2021    2:49 PM  Readmission Risk Prevention Plan  Transportation Screening  Complete Complete  Palliative Care Screening Not Applicable    Medication Review (RN Care Manager)  Complete Complete  HRI or Home Care Consult   Complete  SW Recovery Care/Counseling Consult  Complete   Palliative Care Screening  Not [place]  Not Applicable Complete

## 2021-11-24 NOTE — Progress Notes (Addendum)
Patient has been accepted to [facility] and therefore no longer needs to transfer clinics. [facility] is aware of patient coming back to them. New chair time is: [facility] TTS 6:40am. Clinic is aware that patient may be returning to them tomorrow 7/15.

## 2021-11-24 NOTE — Progress Notes (Signed)
PROGRESS NOTE    Katie [name]  JFH:[id number] DOB: March 24, 1956 DOA: 09/30/2021 PCP: Pcp, No    Brief Narrative:  Prolonged hospitalization due to unsafe disposition. Discharged to living facility, admission acceptance pending.   Assessment & Plan:   ESRD hemodialysis, next dialysis Saturday Essential hypertension Type 2 diabetes Fecal impaction  All clinical issues are resolved.  Stable for discharge.    DVT prophylaxis: heparin injection 5,000 Units Start: 10/23/21 2330   Code Status: Full code Family Communication: None Disposition Plan: Status is: Inpatient Possible discharge today if bed is available.     Consultants:  Multiple  Procedures:  Multiple  Antimicrobials:  Completed   Subjective:  No new events.  Denies any complaints.  Objective: Vitals:   11/23/21 2108 11/24/21 0304 11/24/21 0412 11/24/21 0741  BP: 136/63  118/70 (!) 130/52  Pulse: 74  71 71  Resp: 16  18   Temp: 98.2 F (36.8 C)  98.6 F (37 C) 98.7 F (37.1 C)  TempSrc: Oral     SpO2: 97%  95% 95%  Weight:  76.9 kg    Height:        Intake/Output Summary (Last 24 hours) at 11/24/2021 1135 Last data filed at 11/23/2021 2100 Gross per 24 hour  Intake 360 ml  Output --  Net 360 ml   Filed Weights   11/22/21 1204 11/23/21 0500 11/24/21 0304  Weight: 73.5 kg 77.6 kg 76.9 kg    Examination:  Looks very comfortable.  Excited to be discharged.    Data Reviewed: I have personally reviewed following labs and imaging studies  CBC: Recent Labs  Lab 11/22/21 0850  WBC 7.3  HGB 10.3*  HCT 33.9*  MCV 97.1  PLT 937   Basic Metabolic Panel: Recent Labs  Lab 11/22/21 0850  NA 137  K 4.1  CL 102  CO2 27  GLUCOSE 103*  BUN 65*  CREATININE 6.46*  CALCIUM 8.5*  PHOS 5.4*   GFR: Estimated Creatinine Clearance: 9.3 mL/min (A) (by C-G formula based on SCr of 6.46 mg/dL (H)). Liver Function Tests: Recent Labs  Lab 11/22/21 0850  ALBUMIN 2.8*   No results for  input(s): "LIPASE", "AMYLASE" in the last 168 hours. No results for input(s): "AMMONIA" in the last 168 hours. Coagulation Profile: No results for input(s): "INR", "PROTIME" in the last 168 hours. Cardiac Enzymes: No results for input(s): "CKTOTAL", "CKMB", "CKMBINDEX", "TROPONINI" in the last 168 hours. BNP (last 3 results) No results for input(s): "PROBNP" in the last 8760 hours. HbA1C: No results for input(s): "HGBA1C" in the last 72 hours. CBG: Recent Labs  Lab 11/20/21 0742 11/21/21 0820 11/21/21 1622 11/23/21 0741 11/24/21 0742  GLUCAP 85 88 132* 94 95   Lipid Profile: No results for input(s): "CHOL", "HDL", "LDLCALC", "TRIG", "CHOLHDL", "LDLDIRECT" in the last 72 hours. Thyroid Function Tests: No results for input(s): "TSH", "T4TOTAL", "FREET4", "T3FREE", "THYROIDAB" in the last 72 hours. Anemia Panel: No results for input(s): "VITAMINB12", "FOLATE", "FERRITIN", "TIBC", "IRON", "RETICCTPCT" in the last 72 hours. Sepsis Labs: No results for input(s): "PROCALCITON", "LATICACIDVEN" in the last 168 hours.  No results found for this or any previous visit (from the past 240 hour(s)).       Radiology Studies: DG Chest Port 1 View  Addendum Date: 11/23/2021   ADDENDUM REPORT: 11/23/2021 12:17 ADDENDUM: Requested No radiographic evidence of TB identified. Electronically Signed   By: Lavonia Dana M.D.   On: 11/23/2021 12:17   Result Date: 11/23/2021 CLINICAL DATA:  Screening,  chronic kidney disease, type II diabetes mellitus, hypertension EXAM: PORTABLE CHEST 1 VIEW COMPARISON:  Portable exam 1107 hours compared to 10/23/2021 FINDINGS: RIGHT jugular Port-A-Cath with tip projecting over cavoatrial junction. Enlargement of cardiac silhouette. Mediastinal contours and pulmonary vascularity normal. Atherosclerotic calcification aorta. Minimal bibasilar atelectasis. Lungs otherwise clear. No acute infiltrate, pleural effusion, or pneumothorax. No acute osseous findings. IMPRESSION:  Minimal bibasilar atelectasis. Aortic Atherosclerosis (ICD10-I70.0). Electronically Signed: By: Lavonia Dana M.D. On: 11/23/2021 11:18        Scheduled Meds:  (feeding supplement) PROSource Plus  30 mL Oral BID BM   carvedilol  12.5 mg Oral QPM   Chlorhexidine Gluconate Cloth  6 each Topical Q0600   Chlorhexidine Gluconate Cloth  6 each Topical Q0600   citalopram  10 mg Oral Daily   furosemide  40 mg Oral Daily   heparin  5,000 Units Subcutaneous Q12H   multivitamin  1 tablet Oral QHS   ondansetron (ZOFRAN) IV  4 mg Intravenous Once   pantoprazole  40 mg Oral BID   psyllium  1 packet Oral q AM   traZODone  50 mg Oral QHS   Continuous Infusions:   LOS: 32 days    Time spent: 25 minutes    Barb Merino, MD [facility] Pager [phone]

## 2021-11-25 DIAGNOSIS — L0390 Cellulitis, unspecified: Secondary | ICD-10-CM

## 2021-11-25 LAB — CBC WITH DIFFERENTIAL/PLATELET
Abs Immature Granulocytes: 0.03 10*3/uL (ref 0.00–0.07)
Basophils Absolute: 0.1 10*3/uL (ref 0.0–0.1)
Basophils Relative: 1 %
Eosinophils Absolute: 0.1 10*3/uL (ref 0.0–0.5)
Eosinophils Relative: 2 %
HCT: 32.8 % — ABNORMAL LOW (ref 36.0–46.0)
Hemoglobin: 10.2 g/dL — ABNORMAL LOW (ref 12.0–15.0)
Immature Granulocytes: 0 %
Lymphocytes Relative: 17 %
Lymphs Abs: 1.3 10*3/uL (ref 0.7–4.0)
MCH: 30.3 pg (ref 26.0–34.0)
MCHC: 31.1 g/dL (ref 30.0–36.0)
MCV: 97.3 fL (ref 80.0–100.0)
Monocytes Absolute: 0.9 10*3/uL (ref 0.1–1.0)
Monocytes Relative: 11 %
Neutro Abs: 5.5 10*3/uL (ref 1.7–7.7)
Neutrophils Relative %: 69 %
Platelets: 230 10*3/uL (ref 150–400)
RBC: 3.37 MIL/uL — ABNORMAL LOW (ref 3.87–5.11)
RDW: 17.1 % — ABNORMAL HIGH (ref 11.5–15.5)
WBC: 7.9 10*3/uL (ref 4.0–10.5)
nRBC: 0 % (ref 0.0–0.2)

## 2021-11-25 LAB — HEPATITIS B CORE ANTIBODY, IGM: Hep B C IgM: NONREACTIVE

## 2021-11-25 LAB — HEPATITIS B SURFACE ANTIGEN: Hepatitis B Surface Ag: NONREACTIVE

## 2021-11-25 LAB — COMPREHENSIVE METABOLIC PANEL
ALT: 15 U/L (ref 0–44)
AST: 19 U/L (ref 15–41)
Albumin: 2.8 g/dL — ABNORMAL LOW (ref 3.5–5.0)
Alkaline Phosphatase: 84 U/L (ref 38–126)
Anion gap: 8 (ref 5–15)
BUN: 73 mg/dL — ABNORMAL HIGH (ref 8–23)
CO2: 25 mmol/L (ref 22–32)
Calcium: 8.6 mg/dL — ABNORMAL LOW (ref 8.9–10.3)
Chloride: 102 mmol/L (ref 98–111)
Creatinine, Ser: 7.21 mg/dL — ABNORMAL HIGH (ref 0.44–1.00)
GFR, Estimated: 6 mL/min — ABNORMAL LOW (ref >60)
Glucose, Bld: 105 mg/dL — ABNORMAL HIGH (ref 70–99)
Potassium: 4.4 mmol/L (ref 3.5–5.1)
Sodium: 135 mmol/L (ref 135–145)
Total Bilirubin: 0.6 mg/dL (ref 0.3–1.2)
Total Protein: 6 g/dL — ABNORMAL LOW (ref 6.5–8.1)

## 2021-11-25 LAB — GLUCOSE, CAPILLARY: Glucose-Capillary: 95 mg/dL (ref 70–99)

## 2021-11-25 LAB — HEPATITIS B CORE ANTIBODY, TOTAL: Hep B Core Total Ab: NONREACTIVE

## 2021-11-25 MED ORDER — HEPARIN SODIUM (PORCINE) 1000 UNIT/ML IJ SOLN
INTRAMUSCULAR | Status: AC
  Filled 2021-11-25: qty 4

## 2021-11-25 NOTE — Discharge Summary (Signed)
Physician Discharge Summary  Katie [name] XBD:[id number] DOB: 09/01/1955 DOA: 09/30/2021  PCP: Pcp, No  Admit date: 09/30/2021 Discharge date: 11/25/2021  Admitted From: Home Disposition: Assisted living facility  Recommendations for Outpatient Follow-up:  Continue outpatient hemodialysis as a scheduled  Home Health: N/A Equipment/Devices: N/A  Discharge Condition: Fair CODE STATUS: Full code Diet recommendation: Low-salt diet  Discharge summary: 66 year old with history of ESRD on hemodialysis, type 2 diabetes, homelessness admitted on 10/23/2021 with cough and shortness of breath and fever and chest x-ray consistent with pneumonia.  She was admitted and diagnosed with sepsis and pneumonia, treated with antibiotics.  Patient is currently homeless, she had severe weakness.  Remained in the hospital due to not having safe disposition.  Receiving dialysis on her schedule. After 54 days in the hospital, she is going to be discharged to an assisted living facility and continue medical care and dialysis.  ESRD on hemodialysis: Receiving dialysis on schedule.  Outpatient dialysis is schedule as per orders.  Upper extremity cellulitis/sepsis from pneumonia improved  Fecal impaction constipation: Improved.  Keep on a scheduled stool softeners and laxatives.  Also on Metamucil.  Essential hypertension: Tolerating carvedilol and Lasix.  She does get intradialytic hypotension, orthostatic precautions on the day of dialysis.  Type 2 diabetes: Diet controlled.  Does not need any medications.  Anxiety and depression: Using trazodone at night for sleep, using Celexa for anxiety.  Symptoms well controlled.  Back pain and arm pain: Using low-dose Percocet and Zanaflex that will be prescribed.  Patient is stable to discharge to supervised level of care.  Discharge Diagnoses:  Principal Problem:   Adjustment disorder with mixed anxiety and depressed mood Active Problems:   Cellulitis    Hypertension   Type 2 diabetes mellitus (HCC)   ESRD (end stage renal disease) (HCC)   Financial difficulty   Anemia in chronic kidney disease   Sepsis ([place])   Bent bone fracture of ulna   Hospital acquired PNA   Fecal impaction (HCC)   Aortic stenosis   Left arm cellulitis    Discharge Instructions  Discharge Instructions     Diet - low sodium heart healthy   Complete by: As directed    Increase activity slowly   Complete by: As directed    No wound care   Complete by: As directed    Place in ED Boarder Status   Complete by: As directed       Allergies as of 11/25/2021       Reactions   Penicillins Nausea And Vomiting   Pt states "only pills" make her vomit Other reaction(s): Nausea/Vomit        Medication List     STOP taking these medications    amLODipine 10 MG tablet Commonly known as: NORVASC   amoxicillin-clavulanate 250-125 MG tablet Commonly known as: AUGMENTIN   calcium acetate 667 MG capsule Commonly known as: PHOSLO   lactulose 10 GM/15ML solution Commonly known as: CHRONULAC   lidocaine 5 % Commonly known as: LIDODERM   lidocaine-prilocaine cream Commonly known as: EMLA   venlafaxine 37.5 MG tablet Commonly known as: EFFEXOR       TAKE these medications    acetaminophen 500 MG tablet Commonly known as: TYLENOL Take 500 mg by mouth every 6 (six) hours as needed for moderate pain.   carvedilol 12.5 MG tablet Commonly known as: COREG Take 1 tablet (12.5 mg total) by mouth every evening.   citalopram 10 MG tablet Commonly known as: CELEXA Take 1  tablet (10 mg total) by mouth daily.   furosemide 40 MG tablet Commonly known as: LASIX Take 1 tablet (40 mg total) by mouth daily.   ondansetron 4 MG disintegrating tablet Commonly known as: ZOFRAN-ODT Take 1 tablet (4 mg total) by mouth every 8 (eight) hours as needed for nausea or vomiting.   oxyCODONE-acetaminophen 5-325 MG tablet Commonly known as: PERCOCET/ROXICET Take 1  tablet by mouth every 8 (eight) hours as needed for moderate pain or severe pain. What changed:  how much to take when to take this   pantoprazole 40 MG tablet Commonly known as: PROTONIX Take 1 tablet (40 mg total) by mouth 2 (two) times daily.   senna-docusate 8.6-50 MG tablet Commonly known as: Senokot-S Take 1 tablet by mouth 2 (two) times daily.   tiZANidine 4 MG tablet Commonly known as: ZANAFLEX Take 4 mg by mouth 2 (two) times daily as needed.   topiramate 50 MG tablet Commonly known as: TOPAMAX Take 1 tablet (50 mg total) by mouth 2 (two) times daily as needed (headache).   traZODone 50 MG tablet Commonly known as: DESYREL Take 1 tablet (50 mg total) by mouth every evening.        Allergies  Allergen Reactions   Penicillins Nausea And Vomiting    Pt states "only pills" make her vomit Other reaction(s): Nausea/Vomit    Consultations: Multiple   Procedures/Studies: DG Chest Port 1 View  Addendum Date: 11/23/2021   ADDENDUM REPORT: 11/23/2021 12:17 ADDENDUM: Requested No radiographic evidence of TB identified. Electronically Signed   By: Lavonia Dana M.D.   On: 11/23/2021 12:17   Result Date: 11/23/2021 CLINICAL DATA:  Screening, chronic kidney disease, type II diabetes mellitus, hypertension EXAM: PORTABLE CHEST 1 VIEW COMPARISON:  Portable exam 1107 hours compared to 10/23/2021 FINDINGS: RIGHT jugular Port-A-Cath with tip projecting over cavoatrial junction. Enlargement of cardiac silhouette. Mediastinal contours and pulmonary vascularity normal. Atherosclerotic calcification aorta. Minimal bibasilar atelectasis. Lungs otherwise clear. No acute infiltrate, pleural effusion, or pneumothorax. No acute osseous findings. IMPRESSION: Minimal bibasilar atelectasis. Aortic Atherosclerosis (ICD10-I70.0). Electronically Signed: By: Lavonia Dana M.D. On: 11/23/2021 11:18   [place] LT UPPER EXTREM LTD SOFT TISSUE NON VASCULAR  Result Date: 11/19/2021 CLINICAL DATA:  Left arm  pain, redness, and swelling. EXAM: ULTRASOUND LEFT UPPER EXTREMITY LIMITED TECHNIQUE: Ultrasound examination of the upper extremity soft tissues was performed in the area of clinical concern. COMPARISON:  None Available. FINDINGS: Focal ultrasound of the areas of concern in the left upper arm and forearm demonstrate no discrete soft tissue mass or fluid collection. IMPRESSION: 1. Negative. No sonographic abnormality at the sites of the palpable abnormalities confirmed by the patient. Electronically Signed   By: Titus Dubin M.D.   On: 11/19/2021 13:35   US Venous Img Upper Uni Left (DVT)  Result Date: 11/08/2021 CLINICAL DATA:  Left upper extremity pain and edema. Patient is anticoagulation. Evaluate for DVT. End-stage renal disease with right jugular approach dialysis catheter. EXAM: LEFT UPPER EXTREMITY VENOUS DOPPLER ULTRASOUND TECHNIQUE: Gray-scale sonography with graded compression, as well as color Doppler and duplex ultrasound were performed to evaluate the upper extremity deep venous system from the level of the subclavian vein and including the jugular, axillary, basilic, radial, ulnar and upper cephalic vein. Spectral Doppler was utilized to evaluate flow at rest and with distal augmentation maneuvers. COMPARISON:  None Available. FINDINGS: Contralateral Subclavian Vein: Respiratory phasicity is normal and symmetric with the symptomatic side. No evidence of thrombus. Normal compressibility. Internal Jugular Vein: No  evidence of thrombus. Normal compressibility, respiratory phasicity and response to augmentation. Subclavian Vein: No evidence of thrombus. Normal compressibility, respiratory phasicity and response to augmentation. Axillary Vein: No evidence of thrombus. Normal compressibility, respiratory phasicity and response to augmentation. Cephalic Vein: No evidence of thrombus. Normal compressibility, respiratory phasicity and response to augmentation. Basilic Vein: No evidence of thrombus.  Normal compressibility, respiratory phasicity and response to augmentation. Brachial Veins: No evidence of thrombus. Normal compressibility, respiratory phasicity and response to augmentation. Radial Veins: No evidence of thrombus. Normal compressibility, respiratory phasicity and response to augmentation. Ulnar Veins: No evidence of thrombus. Normal compressibility, respiratory phasicity and response to augmentation. Other Findings: There is a minimal amount of subcutaneous edema at the level forearm. IMPRESSION: No evidence of DVT within the left upper extremity. Electronically Signed   By: Sandi Mariscal M.D.   On: 11/08/2021 16:32   DG Swallowing Func-Speech Pathology  Result Date: 11/03/2021 Table formatting from the original result was not included. Objective Swallowing Evaluation: Type of Study: MBS-Modified Barium Swallow Study  Patient Details Name: Mirza Kidney MRN: [id number] Date of Birth: 09/06/1955 Today's Date: 11/03/2021 Time: SLP Start Time (ACUTE ONLY): 1115 -SLP Stop Time (ACUTE ONLY): 1215 SLP Time Calculation (min) (ACUTE ONLY): 60 min Past Medical History: Past Medical History: Diagnosis Date  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[place])   Hypertension   Renal disorder   Type 2 diabetes mellitus ([place])  Past Surgical History: Past Surgical History: Procedure Laterality Date  APPENDECTOMY    AV FISTULA PLACEMENT Left 03/16/2021  Procedure: INSERTION OF ARTERIOVENOUS (AV) GORE-TEX GRAFT ARM;  Surgeon: Algernon Huxley, MD;  Location: [facility]S;  Service: Vascular;  Laterality: Left;  DIALYSIS/PERMA CATHETER INSERTION N/A 05/16/2020  Procedure: DIALYSIS/PERMA CATHETER INSERTION;  Surgeon: Algernon Huxley, MD;  Location: [place] [facility];  Service: Cardiovascular;  Laterality: N/A;  MANDIBLE SURGERY    RENAL BIOPSY Right   RIGHT HEART CATH N/A 05/10/2020  Procedure: RIGHT HEART CATH;  Surgeon: Nelva Bush, MD;  Location: [place] [facility];  Service: Cardiovascular;  Laterality: N/A;  TEMPORARY  DIALYSIS CATHETER N/A 05/11/2020  Procedure: TEMPORARY DIALYSIS CATHETER;  Surgeon: Algernon Huxley, MD;  Location: [place] [facility];  Service: Cardiovascular;  Laterality: N/A; HPI: Pt  is a 66 y.o. female with medical history significant of ESRD, HD, vision deficits reported by pt, DM II, Homelessness. Had been admitted 05/13-05/17 for sepsis d/t cellulits from animal bite to arm, discharged on po abx and was unable to fill the Rx, was kicked out of where she was living, no way to get HD.  Patient has been in the ED since arriving the hospital.  She was admitted to the hospital again on 10/23/2021 as patient developed cough short of breath and fever.  Chest x-ray showed pneumonia.  Patient was diagnosed with sepsis and pneumonia, was started on cefepime and vancomycin.  Had MRI performed on 6/14 did not show any osteomyelitis.  Patient condition appears to be improving.  Patient has been seen by ID, currently on vancomycin and cefepime.  Patient no longer has any fever, blood culture has been negative.   Patient is currently homeless, has a severe weakness.  No discharge option at this time.   CXR on 10/23/21: bibasilar airspace disease, left greater than right, with  persistent small pleural effusions.  Subjective: pt was verbal and talkative. Pleasant and followed instructions. Vision deficits reported.  Recommendations for follow up therapy are one component of a multi-disciplinary  discharge planning process, led by the attending physician.  Recommendations may be updated based on patient status, additional functional criteria and insurance authorization. Assessment / Plan / Recommendation   11/01/2021   2:00 PM Clinical Impressions SLP Visit Diagnosis Dysphagia, unspecified (R13.10) Impact on safety and function --     11/01/2021   2:00 PM Treatment Recommendations Treatment Recommendations No treatment recommended at this time     11/01/2021   2:00 PM Prognosis Prognosis for Safe Diet Advancement Good Barriers to  Reach Goals Time post onset;Severity of deficits   11/01/2021   2:00 PM Diet Recommendations SLP Diet Recommendations Regular solids;Thin liquid Liquid Administration via Cup;Straw Medication Administration Whole meds with liquid Compensations Minimize environmental distractions;Slow rate;Small sips/bites;Lingual sweep for clearance of pocketing;Follow solids with liquid Postural Changes Remain semi-upright after after feeds/meals (Comment);Seated upright at 90 degrees     11/01/2021   2:00 PM Other Recommendations Recommended Consults Consider GI evaluation;Consider esophageal assessment Oral Care Recommendations Oral care BID;Oral care before and after PO;Patient independent with oral care Other Recommendations -- Follow Up Recommendations No SLP follow up Assistance recommended at discharge PRN Functional Status Assessment Patient has not had a recent decline in their functional status   11/01/2021   2:00 PM Frequency and Duration  Speech Therapy Frequency (ACUTE ONLY) -- Treatment Duration --     11/01/2021   2:00 PM Oral Phase Oral Phase [facility]    11/01/2021   2:00 PM Pharyngeal Phase Pharyngeal Phase [facility]    11/01/2021   2:00 PM Cervical Esophageal Phase  Cervical Esophageal Phase [facility] Happi Overton 11/03/2021, 2:29 PM                     DG Abd 1 View  Result Date: 10/29/2021 CLINICAL DATA:  Fecal impaction with abdominal pain and distension. EXAM: ABDOMEN - 1 VIEW COMPARISON:  10/26/2021 FINDINGS: Examination demonstrates air throughout the colon with near complete clearance of previously seen moderate fecal retention. No dilated bowel loops. No air-fluid levels or free peritoneal air. Slight improvement left base opacification partially visualized central venous catheter over the SVC. Remainder of the exam is unchanged. IMPRESSION: Nonobstructive bowel gas pattern with near complete clearance of previously seen moderate fecal retention. Electronically Signed   By: Marin Olp M.D.   On: 10/29/2021 12:09   CT  SHOULDER LEFT WO CONTRAST  Result Date: 10/26/2021 CLINICAL DATA:  Clinical suspicion for septic arthritis involving the left shoulder joint. EXAM: CT OF THE UPPER LEFT EXTREMITY WITHOUT CONTRAST TECHNIQUE: Multidetector CT imaging of the upper left extremity was performed according to the standard protocol. RADIATION DOSE REDUCTION: This exam was performed according to the departmental dose-optimization program which includes automated exposure control, adjustment of the mA and/or kV according to patient size and/or use of iterative reconstruction technique. COMPARISON:  None Available. FINDINGS: The glenohumeral joint is maintained. I do not see any destructive bony changes to suggest septic arthritis. No obvious joint effusion or inflammatory changes involving the shoulder musculature. There is some subcutaneous inflammatory change along the posterior deltoid muscle without discrete fluid collection or hematoma. The [facility] joint is intact. Mild degenerative changes. The acromion is flat or type 1 in shape. There is undersurface spurring change. Grossly by CT the rotator cuff tendons are intact. There is calcific tendinopathy the involving the supraspinatus attachment site. The visualized left ribs are intact and the visualized left lung is grossly clear. IMPRESSION: 1. No CT findings to suggest septic arthritis or osteomyelitis. 2. Mild subcutaneous  inflammatory change along the posterior deltoid muscle without discrete fluid collection or hematoma. 3. Grossly by CT the rotator cuff tendons are intact. There is calcific tendinopathy involving the supraspinatus attachment site. Electronically Signed   By: Marijo Sanes M.D.   On: 10/26/2021 20:25   CT CHEST WO CONTRAST  Result Date: 10/26/2021 CLINICAL DATA:  Abnormal xray - lung nodule, >= 1 cm EXAM: CT CHEST WITHOUT CONTRAST TECHNIQUE: Multidetector CT imaging of the chest was performed following the standard protocol without IV contrast. RADIATION DOSE  REDUCTION: This exam was performed according to the departmental dose-optimization program which includes automated exposure control, adjustment of the mA and/or kV according to patient size and/or use of iterative reconstruction technique. COMPARISON:  12/12/2020 FINDINGS: Cardiovascular: Extensive multi-vessel coronary artery calcification. Calcification of the aortic valve leaflets noted. Moderate calcification of the mitral valve annulus. Global cardiac size within normal limits. No pericardial effusion. Central pulmonary arteries are of normal caliber. Mild atherosclerotic calcification within the thoracic aorta. No aortic aneurysm. Right internal jugular hemodialysis catheter tip noted within the superior right atrium. Mediastinum/Nodes: Multiple subcentimeter nodules and calcifications are seen within the thyroid gland, unlikely of clinical significance. No further follow-up is recommended for these lesions. There is shotty mediastinal adenopathy, possibly reactive in nature. No frankly pathologic thoracic adenopathy. The esophagus is unremarkable. Lungs/Pleura: There is dense right lower lobe consolidation, possibly related to acute infection or aspiration. Small right parapneumonic effusion. There is left lower lobe volume loss and traction bronchiectasis, likely the sequela of remote infection or inflammation. Small left pleural effusion is present. No pneumothorax. Central airways are widely patent. Upper Abdomen: Cholelithiasis without pericholecystic inflammatory change noted. No acute abnormality. Musculoskeletal: No acute bone abnormality. No lytic or blastic bone lesion. IMPRESSION: 1. Dense right lower lobe consolidation, possibly related to acute infection or aspiration. Small associated right parapneumonic effusion. 2. Small left pleural effusion. 3. Extensive multi-vessel coronary artery calcification. 4. Cholelithiasis. Electronically Signed   By: Fidela Salisbury M.D.   On: 10/26/2021 19:43    DG Abd 1 View  Result Date: 10/26/2021 CLINICAL DATA:  Abdominal pain with history of CKD. EXAM: ABDOMEN - 1 VIEW COMPARISON:  March 18, 2021. FINDINGS: Moderate gastric distension. Large volume stool throughout the colon. Large volume stool in the rectum. 10 cm distension of the cecum. On limited assessment no acute skeletal findings. Edema in the fat of the LEFT and RIGHT flank. Findings of dense consolidative changes in the LEFT lung base. IMPRESSION: 1. Large volume stool throughout the colon and rectum. Correlate with signs of constipation with rectal fecal impaction. Note that the cecum is dilated up to 10 cm, beyond this level of distension there is risk for perforation. 2. Moderate gastric distension without small bowel dilation visible on today's study. 3. Findings of dense consolidative changes in the LEFT lung base, correlate with any signs of pneumonia though there was basilar collapse on the study of Sep 23, 2021 which persists and is been present as far back as July of 2022. Consider follow-up dedicated chest CT to exclude endobronchial lesion as a cause for chronic lobar collapse. These results will be called to the ordering clinician or representative by the Radiologist Assistant, and communication documented in the PACS or [employer]. Electronically Signed   By: Zetta Bills M.D.   On: 10/26/2021 16:11   (Echo, Carotid, EGD, Colonoscopy, ERCP)    Subjective: Seen and examined.  Excited to get out of the hospital.  Very appreciative of care.  Discharge  Exam: Vitals:   11/25/21 1015 11/25/21 1030  BP: (!) 79/49 (!) 102/55  Pulse: 68 65  Resp: 16 12  Temp:    SpO2: 100% 100%   Vitals:   11/25/21 0930 11/25/21 1000 11/25/21 1015 11/25/21 1030  BP: (!) 105/52 (!) 94/50 (!) 79/49 (!) 102/55  Pulse: 61 62 68 65  Resp: 17 18 16 12   Temp:      TempSrc:      SpO2: 99% 100% 100% 100%  Weight:      Height:        General: Pt is alert, awake, not in acute distress On  room air.  Looks fairly comfortable. Permacath present right IJ. Cardiovascular: RRR, S1/S2 +, no rubs, no gallops Respiratory: CTA bilaterally, no wheezing, no rhonchi Abdominal: Soft, NT, ND, bowel sounds + Extremities: no edema, no cyanosis    The results of significant diagnostics from this hospitalization (including imaging, microbiology, ancillary and laboratory) are listed below for reference.     Microbiology: No results found for this or any previous visit (from the past 240 hour(s)).   Labs: BNP (last 3 results) Recent Labs    03/07/21 1114  BNP 70.1   Basic Metabolic Panel: Recent Labs  Lab 11/22/21 0850 11/25/21 0800  NA 137 135  K 4.1 4.4  CL 102 102  CO2 27 25  GLUCOSE 103* 105*  BUN 65* 73*  CREATININE 6.46* 7.21*  CALCIUM 8.5* 8.6*  PHOS 5.4*  --    Liver Function Tests: Recent Labs  Lab 11/22/21 0850 11/25/21 0800  AST  --  19  ALT  --  15  ALKPHOS  --  84  BILITOT  --  0.6  PROT  --  6.0*  ALBUMIN 2.8* 2.8*   No results for input(s): "LIPASE", "AMYLASE" in the last 168 hours. No results for input(s): "AMMONIA" in the last 168 hours. CBC: Recent Labs  Lab 11/22/21 0850 11/25/21 0800  WBC 7.3 7.9  NEUTROABS  --  5.5  HGB 10.3* 10.2*  HCT 33.9* 32.8*  MCV 97.1 97.3  PLT 281 230   Cardiac Enzymes: No results for input(s): "CKTOTAL", "CKMB", "CKMBINDEX", "TROPONINI" in the last 168 hours. BNP: Invalid input(s): "POCBNP" CBG: Recent Labs  Lab 11/21/21 0820 11/21/21 1622 11/23/21 0741 11/24/21 0742 11/24/21 1544  GLUCAP 88 132* 94 95 79   D-Dimer No results for input(s): "DDIMER" in the last 72 hours. Hgb A1c No results for input(s): "HGBA1C" in the last 72 hours. Lipid Profile No results for input(s): "CHOL", "HDL", "LDLCALC", "TRIG", "CHOLHDL", "LDLDIRECT" in the last 72 hours. Thyroid function studies No results for input(s): "TSH", "T4TOTAL", "T3FREE", "THYROIDAB" in the last 72 hours.  Invalid input(s):  "FREET3" Anemia work up No results for input(s): "VITAMINB12", "FOLATE", "FERRITIN", "TIBC", "IRON", "RETICCTPCT" in the last 72 hours. Urinalysis    Component Value Date/Time   COLORURINE YELLOW (A) 09/23/2021 2143   APPEARANCEUR HAZY (A) 09/23/2021 2143   LABSPEC 1.012 09/23/2021 2143   PHURINE 9.0 (H) 09/23/2021 2143   GLUCOSEU 150 (A) 09/23/2021 2143   HGBUR NEGATIVE 09/23/2021 2143   BILIRUBINUR NEGATIVE 09/23/2021 2143   KETONESUR 5 (A) 09/23/2021 2143   PROTEINUR 100 (A) 09/23/2021 2143   NITRITE NEGATIVE 09/23/2021 2143   LEUKOCYTESUR LARGE (A) 09/23/2021 2143   Sepsis Labs Recent Labs  Lab 11/22/21 0850 11/25/21 0800  WBC 7.3 7.9   Microbiology No results found for this or any previous visit (from the past 240 hour(s)).   Time  coordinating discharge: 35 minutes  SIGNED:   Vanna Scotland, MD  [facility] 11/25/2021, 11:02 AM

## 2021-11-25 NOTE — Progress Notes (Signed)
3 hours HD treatment completed without complications Total uf removed: 52ml Post hd v/s: 98.2 129/49(67) 67 18 100% Post hd weight: 73.3kg

## 2021-11-25 NOTE — Progress Notes (Signed)
[facility]  Dialysis Note   Subjective:   Patient seen and evaluated during dialysis   HEMODIALYSIS FLOWSHEET:  Blood Flow Rate (mL/min): 350 mL/min Arterial Pressure (mmHg): -150 mmHg Venous Pressure (mmHg): 180 mmHg TMP (mmHg): -1 mmHg Ultrafiltration Rate (mL/min): 225 mL/min Dialysate Flow Rate (mL/min): 300 ml/min DO NOT RCV:ELFYBOFBPZWC: 14 DO NOT HEN:IDPOEUMPNTIR: 14 Dialysis Fluid Bolus: Normal Saline Bolus Amount (mL): 300 mL  No complaints at this time  Has a mild cough-chest x-ray from July 13 shows mild atelectasis. BP low normal today therefore UF goal decreased to 0 Dialysis done while sitting in recliner chair  Objective:  Vital signs in last 24 hours:  Temp:  [97.9 F (36.6 C)-98.2 F (36.8 C)] 97.9 F (36.6 C) (07/15 0800) Pulse Rate:  [63-70] 63 (07/15 0830) Resp:  [16-18] 16 (07/15 0830) BP: (90-139)/(50-76) 90/60 (07/15 0830) SpO2:  [95 %-100 %] 100 % (07/15 0830) Weight:  [74.1 kg] 74.1 kg (07/15 0800)  Weight change:  Filed Weights   11/23/21 0500 11/24/21 0304 11/25/21 0800  Weight: 77.6 kg 76.9 kg 74.1 kg    Intake/Output: I/O last 3 completed shifts: In: 120 [P.O.:120] Out: -    Intake/Output this shift:  No intake/output data recorded.  Physical Exam: General: NAD, seated in chair  Head: Normocephalic, atraumatic. Moist oral mucosal membranes  Eyes: Anicteric  Lungs:  Clear to auscultation, normal effort  Heart: Regular rate and rhythm  Abdomen:  Soft, nontender  Extremities: No peripheral edema.  Left upper arm edema  Neurologic: Nonfocal, moving all four extremities  Skin: No lesions  Access: Right PermCath    Basic Metabolic Panel: Recent Labs  Lab 11/22/21 0850 11/25/21 0800  NA 137 135  K 4.1 4.4  CL 102 102  CO2 27 25  GLUCOSE 103* 105*  BUN 65* 73*  CREATININE 6.46* 7.21*  CALCIUM 8.5* 8.6*  PHOS 5.4*  --      Liver Function Tests: Recent Labs  Lab 11/22/21 0850 11/25/21 0800  AST  --   19  ALT  --  15  ALKPHOS  --  84  BILITOT  --  0.6  PROT  --  6.0*  ALBUMIN 2.8* 2.8*    No results for input(s): "LIPASE", "AMYLASE" in the last 168 hours. No results for input(s): "AMMONIA" in the last 168 hours.  CBC: Recent Labs  Lab 11/22/21 0850 11/25/21 0800  WBC 7.3 7.9  NEUTROABS  --  5.5  HGB 10.3* 10.2*  HCT 33.9* 32.8*  MCV 97.1 97.3  PLT 281 230     Cardiac Enzymes: No results for input(s): "CKTOTAL", "CKMB", "CKMBINDEX", "TROPONINI" in the last 168 hours.  BNP: Invalid input(s): "POCBNP"  CBG: Recent Labs  Lab 11/21/21 0820 11/21/21 1622 11/23/21 0741 11/24/21 0742 11/24/21 1544  GLUCAP 88 132* 94 95 79     Microbiology: Results for orders placed or performed during the hospital encounter of 09/30/21  Resp Panel by RT-PCR (Flu A&B, Covid) Nasopharyngeal Swab     Status: None   Collection Time: 09/30/21  9:34 AM   Specimen: Nasopharyngeal Swab; Nasopharyngeal(NP) swabs in vial transport medium  Result Value Ref Range Status   SARS Coronavirus 2 by RT PCR NEGATIVE NEGATIVE Final    Comment: (NOTE) SARS-CoV-2 target nucleic acids are NOT DETECTED.  The SARS-CoV-2 RNA is generally detectable in upper respiratory specimens during the acute phase of infection. The lowest concentration of SARS-CoV-2 viral copies this assay can detect is 138 copies/mL. A negative result  does not preclude SARS-Cov-2 infection and should not be used as the sole basis for treatment or other patient management decisions. A negative result may occur with  improper specimen collection/handling, submission of specimen other than nasopharyngeal swab, presence of viral mutation(s) within the areas targeted by this assay, and inadequate number of viral copies(<138 copies/mL). A negative result must be combined with clinical observations, patient history, and epidemiological information. The expected result is Negative.  Fact Sheet for Patients:   EntrepreneurPulse.com.au  Fact Sheet for Healthcare Providers:  IncredibleEmployment.be  This test is no t yet approved or cleared by the [place] FDA and  has been authorized for detection and/or diagnosis of SARS-CoV-2 by FDA under an Emergency Use Authorization (EUA). This EUA will remain  in effect (meaning this test can be used) for the duration of the COVID-19 declaration under Section 564(b)(1) of the Act, 21 U.S.C.section 360bbb-3(b)(1), unless the authorization is terminated  or revoked sooner.       Influenza A by PCR NEGATIVE NEGATIVE Final   Influenza B by PCR NEGATIVE NEGATIVE Final    Comment: (NOTE) The Xpert Xpress SARS-CoV-2/FLU/RSV plus assay is intended as an aid in the diagnosis of influenza from Nasopharyngeal swab specimens and should not be used as a sole basis for treatment. Nasal washings and aspirates are unacceptable for Xpert Xpress SARS-CoV-2/FLU/RSV testing.  Fact Sheet for Patients: EntrepreneurPulse.com.au  Fact Sheet for Healthcare Providers: IncredibleEmployment.be  This test is not yet approved or cleared by the [place] FDA and has been authorized for detection and/or diagnosis of SARS-CoV-2 by FDA under an Emergency Use Authorization (EUA). This EUA will remain in effect (meaning this test can be used) for the duration of the COVID-19 declaration under Section 564(b)(1) of the Act, 21 U.S.C. section 360bbb-3(b)(1), unless the authorization is terminated or revoked.  Performed at [facility], [address] [place], [place] [address]   SARS Coronavirus 2 by RT PCR (hospital order, performed in [place] hospital lab) *cepheid single result test* Anterior Nasal Swab     Status: None   Collection Time: 10/23/21  4:06 PM   Specimen: Anterior Nasal Swab  Result Value Ref Range Status   SARS Coronavirus 2 by RT PCR NEGATIVE NEGATIVE Final     Comment: (NOTE) SARS-CoV-2 target nucleic acids are NOT DETECTED.  The SARS-CoV-2 RNA is generally detectable in upper and lower respiratory specimens during the acute phase of infection. The lowest concentration of SARS-CoV-2 viral copies this assay can detect is 250 copies / mL. A negative result does not preclude SARS-CoV-2 infection and should not be used as the sole basis for treatment or other patient management decisions.  A negative result may occur with improper specimen collection / handling, submission of specimen other than nasopharyngeal swab, presence of viral mutation(s) within the areas targeted by this assay, and inadequate number of viral copies (<250 copies / mL). A negative result must be combined with clinical observations, patient history, and epidemiological information.  Fact Sheet for Patients:   https://www.patel.info/  Fact Sheet for Healthcare Providers: https://hall.com/  This test is not yet approved or  cleared by the [place] FDA and has been authorized for detection and/or diagnosis of SARS-CoV-2 by FDA under an Emergency Use Authorization (EUA).  This EUA will remain in effect (meaning this test can be used) for the duration of the COVID-19 declaration under Section 564(b)(1) of the Act, 21 U.S.C. section 360bbb-3(b)(1), unless the authorization is terminated or revoked sooner.  Performed  at [facility], [address] [place], [place] [address]   Blood culture (routine x 2)     Status: None   Collection Time: 10/23/21  4:06 PM   Specimen: BLOOD  Result Value Ref Range Status   Specimen Description BLOOD BLOOD RIGHT HAND  Final   Special Requests   Final    BOTTLES DRAWN AEROBIC AND ANAEROBIC Blood Culture adequate volume   Culture   Final    NO GROWTH 5 DAYS Performed at [facility], [address] [place], [place] [address]    Report Status 10/28/2021 FINAL  Final   Culture, blood (Routine X 2) w Reflex to ID Panel     Status: None   Collection Time: 10/24/21 12:06 AM   Specimen: BLOOD  Result Value Ref Range Status   Specimen Description BLOOD LEFT HAND  Final   Special Requests   Final    BOTTLES DRAWN AEROBIC AND ANAEROBIC Blood Culture adequate volume   Culture   Final    NO GROWTH 5 DAYS Performed at [facility], [address], [place], [place] [address]    Report Status 10/29/2021 FINAL  Final  MRSA Next Gen by PCR, Nasal     Status: None   Collection Time: 10/24/21  2:24 PM   Specimen: Nasal Mucosa; Nasal Swab  Result Value Ref Range Status   MRSA by PCR Next Gen NOT DETECTED NOT DETECTED Final    Comment: (NOTE) The GeneXpert MRSA Assay (FDA approved for NASAL specimens only), is one component of a comprehensive MRSA colonization surveillance program. It is not intended to diagnose MRSA infection nor to guide or monitor treatment for MRSA infections. Test performance is not FDA approved in patients less than 65 years old. Performed at [facility], [place], [place] [address]   Respiratory (~20 pathogens) panel by PCR     Status: None   Collection Time: 10/24/21  2:24 PM   Specimen: Nasopharyngeal Swab; Respiratory  Result Value Ref Range Status   Adenovirus NOT DETECTED NOT DETECTED Final   Coronavirus 229E NOT DETECTED NOT DETECTED Final    Comment: (NOTE) The Coronavirus on the Respiratory Panel, DOES NOT test for the novel  Coronavirus (2019 nCoV)    Coronavirus HKU1 NOT DETECTED NOT DETECTED Final   Coronavirus NL63 NOT DETECTED NOT DETECTED Final   Coronavirus OC43 NOT DETECTED NOT DETECTED Final   Metapneumovirus NOT DETECTED NOT DETECTED Final   Rhinovirus / Enterovirus NOT DETECTED NOT DETECTED Final   Influenza A NOT DETECTED NOT DETECTED Final   Influenza B NOT DETECTED NOT DETECTED Final   Parainfluenza Virus 1 NOT DETECTED NOT DETECTED Final   Parainfluenza Virus 2 NOT  DETECTED NOT DETECTED Final   Parainfluenza Virus 3 NOT DETECTED NOT DETECTED Final   Parainfluenza Virus 4 NOT DETECTED NOT DETECTED Final   Respiratory Syncytial Virus NOT DETECTED NOT DETECTED Final   Bordetella pertussis NOT DETECTED NOT DETECTED Final   Bordetella Parapertussis NOT DETECTED NOT DETECTED Final   Chlamydophila pneumoniae NOT DETECTED NOT DETECTED Final   Mycoplasma pneumoniae NOT DETECTED NOT DETECTED Final    Comment: Performed at [facility], [place], [place],  [address]    Coagulation Studies: No results for input(s): "LABPROT", "INR" in the last 72 hours.  Urinalysis: No results for input(s): "COLORURINE", "LABSPEC", "PHURINE", "GLUCOSEU", "HGBUR", "BILIRUBINUR", "KETONESUR", "PROTEINUR", "UROBILINOGEN", "NITRITE", "LEUKOCYTESUR" in the last 72 hours.  Invalid input(s): "APPERANCEUR"  Imaging: DG Chest Port 1 View  Addendum Date: 11/23/2021   ADDENDUM REPORT: 11/23/2021 12:17 ADDENDUM: Requested No radiographic evidence of TB identified. Electronically Signed   By: Lavonia Dana M.D.   On: 11/23/2021 12:17   Result Date: 11/23/2021 CLINICAL DATA:  Screening, chronic kidney disease, type II diabetes mellitus, hypertension EXAM: PORTABLE CHEST 1 VIEW COMPARISON:  Portable exam 1107 hours compared to 10/23/2021 FINDINGS: RIGHT jugular Port-A-Cath with tip projecting over cavoatrial junction. Enlargement of cardiac silhouette. Mediastinal contours and pulmonary vascularity normal. Atherosclerotic calcification aorta. Minimal bibasilar atelectasis. Lungs otherwise clear. No acute infiltrate, pleural effusion, or pneumothorax. No acute osseous findings. IMPRESSION: Minimal bibasilar atelectasis. Aortic Atherosclerosis (ICD10-I70.0). Electronically Signed: By: Lavonia Dana M.D. On: 11/23/2021 11:18     Medications:       (feeding supplement) PROSource Plus  30 mL Oral BID BM   carvedilol  12.5 mg Oral QPM   Chlorhexidine Gluconate Cloth  6 each  Topical Q0600   Chlorhexidine Gluconate Cloth  6 each Topical Q0600   citalopram  10 mg Oral Daily   furosemide  40 mg Oral Daily   heparin  5,000 Units Subcutaneous Q12H   multivitamin  1 tablet Oral QHS   ondansetron (ZOFRAN) IV  4 mg Intravenous Once   pantoprazole  40 mg Oral BID   psyllium  1 packet Oral q AM   traZODone  50 mg Oral QHS   acetaminophen **OR** acetaminophen, albuterol, guaiFENesin, loperamide, ondansetron, oxyCODONE-acetaminophen, senna-docusate, tiZANidine, topiramate  Assessment/ Plan:  [name] is a 66 y.o.  female with past medical history of ESRD with hemodialysis TTS, diabetes mellitus, hypertension to the hospital seeking long term placement. She was recently admitted and received antibiotics for a dog bite.  She was discharged with a prescription for home antibiotics.  She states her caregivers were unable to retrieve antibiotics.  She states that there was an argument and that she is here seeking long-term placement.  Principal Problem:   Adjustment disorder with mixed anxiety and depressed mood Active Problems:   Hypertension   Type 2 diabetes mellitus (HCC)   ESRD (end stage renal disease) ([place])   Financial difficulty   Anemia in chronic kidney disease   Sepsis ([place])   Cellulitis   Bent bone fracture of ulna   Hospital acquired PNA   Fecal impaction (HCC)   Aortic stenosis   Left arm cellulitis   [facility]/MWF/right PermCath/83.5 kg  End Stage Renal Disease on hemodialysis:  Patient currently receiving treatment, seated in chair.  Tolerating treatment well at this time.  UF goal decreased to 0 because of low blood pressure.  Will obtain suggested dry weight after this treatment.  Discharge plan to include assisted living, [facility].  outpatient dialysis schedule is TTS.  2. Hypertension with chronic kidney disease:   Patient remains on carvedilol and furosemide only.   Blood pressure low normal as noted below BP 90/60  (BP Location: Right Arm)   Pulse 63   Temp 97.9 F (36.6 C) (Oral)   Resp 16   Ht 5\' 6"  (1.676 m)   Wt 74.1 kg   SpO2 100%   BMI 26.37 kg/m   3. Anemia of chronic kidney disease/ kidney injury/chronic disease/acute blood loss:   Lab Results  Component Value Date   HGB 10.2 (L) 11/25/2021  Hemoglobin remains at target We will hold EPO at this time.  4. Secondary Hyperparathyroidism:     Lab Results  Component Value Date  PTH 70 (H) 05/12/2020   CALCIUM 8.6 (L) 11/25/2021   PHOS 5.4 (H) 11/22/2021   Calcium and phosphorus remain within acceptable target.  We will continue to monitor.  5.  Severe constipation Managed with stool softeners.   6.  Left upper extremity edema Left upper extremity ultrasound done on July 9 unremarkable.  No evidence of DVT on November 08, 2021.    LOS: [facility] 7/15/20239:00 AM

## 2021-11-25 NOTE — TOC Progression Note (Addendum)
Transition of Care [facility]) - Progression Note    Patient Details  Name: Katie [name] MRN: [id number] Date of Birth: [birth year]  [facility] CM/SW Contact  Izola Price, RN Phone Number: 11/25/2021, 10:24 AM  Clinical Narrative: 7/17: Per prior CM notes, patient must be in facility by 9 am. No EMS arranged prior to this am. No discharge summary ready at 830 am CM signed on at 0830 and unaware of this early discharge need.  Will reach out to see if that will apply to Sunday 7/18 as it is too late today per CM notes. Will request discharge summary to fax today and arrange for EMS at 8 am tomorrow. Updated Unit RN and provider.  Simmie Davies RN CM       Expected Discharge Plan: Skilled Nursing Facility Barriers to Discharge: SNF Pending bed offer  Expected Discharge Plan and Services Expected Discharge Plan: [place]   Discharge Planning Services: CM Consult Post Acute Care Choice: [place] Living arrangements for the past 2 months: Single Family Home Expected Discharge Date: 11/23/21               DME Arranged: N/A DME Agency: NA       HH Arranged: NA HH Agency: NA         Social Determinants of Health (SDOH) Interventions    Readmission Risk Interventions    09/27/2021    1:44 PM 09/26/2021   12:46 PM 03/08/2021    2:49 PM  Readmission Risk Prevention Plan  Transportation Screening  Complete Complete  Palliative Care Screening Not Applicable    Medication Review (RN Care Manager)  Complete Complete  HRI or Home Care Consult   Complete  SW Recovery Care/Counseling Consult  Complete   Palliative Care Screening  Not [place]  Not Applicable Complete

## 2021-11-25 NOTE — Progress Notes (Signed)
PROGRESS NOTE    Sofhia Ulibarri  HWT:[id number] DOB: 1955-07-18 DOA: 09/30/2021 PCP: Pcp, No   Brief Narrative:  Prolonged hospitalization due to unsafe disposition. Discharged to living facility, admission acceptance pending. 7/14- No reported events, hospitalization unremarkable  Assessment & Plan:   ESRD hemodialysis, next dialysis Saturday Essential hypertension Type 2 diabetes Fecal impaction  All clinical issues are resolved.  Stable for discharge.   DVT prophylaxis: heparin injection 5,000 Units Start: 10/23/21 2330   Code Status: Full code Family Communication: None Disposition Plan: Status is: Inpatient Possible discharge today if bed is available.   Consultants:  Multiple  Procedures:  Multiple  Antimicrobials:  Completed   Subjective:  No new events.  Denies any complaints.  Objective: Vitals:   11/24/21 0741 11/24/21 1543 11/24/21 2032 11/25/21 0517  BP: (!) 130/52 139/76 (!) 91/54 134/75  Pulse: 71 68 70 68  Resp:   18 16  Temp: 98.7 F (37.1 C) 98.1 F (36.7 C) 98.2 F (36.8 C) 98 F (36.7 C)  TempSrc:   Oral Oral  SpO2: 95% 97% 95% 95%  Weight:      Height:       No intake or output data in the 24 hours ending 11/25/21 0808  Filed Weights   11/22/21 1204 11/23/21 0500 11/24/21 0304  Weight: 73.5 kg 77.6 kg 76.9 kg    Examination:  Looks very comfortable.  Excited to be discharged.    Data Reviewed: I have personally reviewed following labs and imaging studies  CBC: Recent Labs  Lab 11/22/21 0850  WBC 7.3  HGB 10.3*  HCT 33.9*  MCV 97.1  PLT 917    Basic Metabolic Panel: Recent Labs  Lab 11/22/21 0850  NA 137  K 4.1  CL 102  CO2 27  GLUCOSE 103*  BUN 65*  CREATININE 6.46*  CALCIUM 8.5*  PHOS 5.4*    GFR: Estimated Creatinine Clearance: 9.3 mL/min (A) (by C-G formula based on SCr of 6.46 mg/dL (H)). Liver Function Tests: Recent Labs  Lab 11/22/21 0850  ALBUMIN 2.8*    No results for input(s):  "LIPASE", "AMYLASE" in the last 168 hours. No results for input(s): "AMMONIA" in the last 168 hours. Coagulation Profile: No results for input(s): "INR", "PROTIME" in the last 168 hours. Cardiac Enzymes: No results for input(s): "CKTOTAL", "CKMB", "CKMBINDEX", "TROPONINI" in the last 168 hours. BNP (last 3 results) No results for input(s): "PROBNP" in the last 8760 hours. HbA1C: No results for input(s): "HGBA1C" in the last 72 hours. CBG: Recent Labs  Lab 11/21/21 0820 11/21/21 1622 11/23/21 0741 11/24/21 0742 11/24/21 1544  GLUCAP 88 132* 94 95 79    Lipid Profile: No results for input(s): "CHOL", "HDL", "LDLCALC", "TRIG", "CHOLHDL", "LDLDIRECT" in the last 72 hours. Thyroid Function Tests: No results for input(s): "TSH", "T4TOTAL", "FREET4", "T3FREE", "THYROIDAB" in the last 72 hours. Anemia Panel: No results for input(s): "VITAMINB12", "FOLATE", "FERRITIN", "TIBC", "IRON", "RETICCTPCT" in the last 72 hours. Sepsis Labs: No results for input(s): "PROCALCITON", "LATICACIDVEN" in the last 168 hours.  No results found for this or any previous visit (from the past 240 hour(s)).       Radiology Studies: DG Chest Port 1 View  Addendum Date: 11/23/2021   ADDENDUM REPORT: 11/23/2021 12:17 ADDENDUM: Requested No radiographic evidence of TB identified. Electronically Signed   By: Lavonia Dana M.D.   On: 11/23/2021 12:17   Result Date: 11/23/2021 CLINICAL DATA:  Screening, chronic kidney disease, type II diabetes mellitus, hypertension EXAM: PORTABLE CHEST  1 VIEW COMPARISON:  Portable exam 1107 hours compared to 10/23/2021 FINDINGS: RIGHT jugular Port-A-Cath with tip projecting over cavoatrial junction. Enlargement of cardiac silhouette. Mediastinal contours and pulmonary vascularity normal. Atherosclerotic calcification aorta. Minimal bibasilar atelectasis. Lungs otherwise clear. No acute infiltrate, pleural effusion, or pneumothorax. No acute osseous findings. IMPRESSION: Minimal  bibasilar atelectasis. Aortic Atherosclerosis (ICD10-I70.0). Electronically Signed: By: Lavonia Dana M.D. On: 11/23/2021 11:18        Scheduled Meds:  (feeding supplement) PROSource Plus  30 mL Oral BID BM   carvedilol  12.5 mg Oral QPM   Chlorhexidine Gluconate Cloth  6 each Topical Q0600   Chlorhexidine Gluconate Cloth  6 each Topical Q0600   citalopram  10 mg Oral Daily   furosemide  40 mg Oral Daily   heparin  5,000 Units Subcutaneous Q12H   multivitamin  1 tablet Oral QHS   ondansetron (ZOFRAN) IV  4 mg Intravenous Once   pantoprazole  40 mg Oral BID   psyllium  1 packet Oral q AM   traZODone  50 mg Oral QHS   Continuous Infusions:   LOS: 33 days    Time spent: 25 minutes    Vanna Scotland, MD [facility] Pager [phone]

## 2021-11-26 LAB — GLUCOSE, CAPILLARY
Glucose-Capillary: 105 mg/dL — ABNORMAL HIGH (ref 70–99)
Glucose-Capillary: 149 mg/dL — ABNORMAL HIGH (ref 70–99)

## 2021-11-26 MED ORDER — CARVEDILOL 6.25 MG PO TABS: 6.2500 mg | ORAL_TABLET | Freq: Every evening | ORAL | 0 refills | Status: DC

## 2021-11-26 MED ORDER — CARVEDILOL 6.25 MG PO TABS
6.2500 mg | ORAL_TABLET | Freq: Every evening | ORAL | Status: DC
Start: 2021-11-26 — End: 2021-11-27

## 2021-11-26 NOTE — Discharge Summary (Signed)
Physician Discharge Summary  Katie [name] OHY:[id number] DOB: [birth year] DOA: 09/30/2021  PCP: Pcp, No  Admit date: 09/30/2021 Discharge date: 11/26/2021  Admitted From: Home Disposition: Assisted living facility  Recommendations for Outpatient Follow-up:  Continue outpatient hemodialysis as a scheduled  Home Health: N/A Equipment/Devices: N/A  Discharge Condition: Fair CODE STATUS: Full code Diet recommendation: Low-salt diet  Discharge summary: 66 year old with history of ESRD on hemodialysis, type 2 diabetes, homelessness admitted on 10/23/2021 with cough and shortness of breath and fever and chest x-ray consistent with pneumonia.  She was admitted and diagnosed with sepsis and pneumonia, treated with antibiotics.  Patient is currently homeless, she had severe weakness.  Remained in the hospital due to not having safe disposition.  Receiving dialysis on her schedule. After 54 days in the hospital, she is going to be discharged to an assisted living facility and continue medical care and dialysis.  ESRD on hemodialysis: Receiving dialysis on schedule.  Outpatient dialysis is schedule as per orders.  Upper extremity cellulitis/sepsis from pneumonia improved  Fecal impaction constipation: Improved.  Keep on a scheduled stool softeners and laxatives.  Also on Metamucil.  Essential hypertension: Tolerating carvedilol and Lasix.  She does get intradialytic hypotension, orthostatic precautions on the day of dialysis.  Type 2 diabetes: Diet controlled.  Does not need any medications.  Anxiety and depression: Using trazodone at night for sleep, using Celexa for anxiety.  Symptoms well controlled.  Back pain and arm pain: Using low-dose Percocet and Zanaflex that will be prescribed.  Patient is stable to discharge to supervised level of care.  Discharge Diagnoses:  Principal Problem:   Adjustment disorder with mixed anxiety and depressed mood Active Problems:   Cellulitis    Hypertension   Type 2 diabetes mellitus (HCC)   ESRD (end stage renal disease) (HCC)   Financial difficulty   Anemia in chronic kidney disease   Sepsis ([place])   Bent bone fracture of ulna   Hospital acquired PNA   Fecal impaction (HCC)   Aortic stenosis   Left arm cellulitis    Discharge Instructions  Discharge Instructions     Diet - low sodium heart healthy   Complete by: As directed    Increase activity slowly   Complete by: As directed    No wound care   Complete by: As directed    Place in ED Boarder Status   Complete by: As directed       Allergies as of 11/26/2021       Reactions   Penicillins Nausea And Vomiting   Pt states "only pills" make her vomit Other reaction(s): Nausea/Vomit        Medication List     STOP taking these medications    amLODipine 10 MG tablet Commonly known as: NORVASC   amoxicillin-clavulanate 250-125 MG tablet Commonly known as: AUGMENTIN   calcium acetate 667 MG capsule Commonly known as: PHOSLO   lactulose 10 GM/15ML solution Commonly known as: CHRONULAC   lidocaine 5 % Commonly known as: LIDODERM   lidocaine-prilocaine cream Commonly known as: EMLA   venlafaxine 37.5 MG tablet Commonly known as: EFFEXOR       TAKE these medications    acetaminophen 500 MG tablet Commonly known as: TYLENOL Take 500 mg by mouth every 6 (six) hours as needed for moderate pain.   carvedilol 12.5 MG tablet Commonly known as: COREG Take 1 tablet (12.5 mg total) by mouth every evening.   citalopram 10 MG tablet Commonly known as: CELEXA Take 1  tablet (10 mg total) by mouth daily.   furosemide 40 MG tablet Commonly known as: LASIX Take 1 tablet (40 mg total) by mouth daily.   ondansetron 4 MG disintegrating tablet Commonly known as: ZOFRAN-ODT Take 1 tablet (4 mg total) by mouth every 8 (eight) hours as needed for nausea or vomiting.   oxyCODONE-acetaminophen 5-325 MG tablet Commonly known as: PERCOCET/ROXICET Take 1  tablet by mouth every 8 (eight) hours as needed for moderate pain or severe pain. What changed:  how much to take when to take this   pantoprazole 40 MG tablet Commonly known as: PROTONIX Take 1 tablet (40 mg total) by mouth 2 (two) times daily.   senna-docusate 8.6-50 MG tablet Commonly known as: Senokot-S Take 1 tablet by mouth 2 (two) times daily.   tiZANidine 4 MG tablet Commonly known as: ZANAFLEX Take 4 mg by mouth 2 (two) times daily as needed.   topiramate 50 MG tablet Commonly known as: TOPAMAX Take 1 tablet (50 mg total) by mouth 2 (two) times daily as needed (headache).   traZODone 50 MG tablet Commonly known as: DESYREL Take 1 tablet (50 mg total) by mouth every evening.        Allergies  Allergen Reactions   Penicillins Nausea And Vomiting    Pt states "only pills" make her vomit Other reaction(s): Nausea/Vomit    Consultations: Multiple   Procedures/Studies: DG Chest Port 1 View  Addendum Date: 11/23/2021   ADDENDUM REPORT: 11/23/2021 12:17 ADDENDUM: Requested No radiographic evidence of TB identified. Electronically Signed   By: Lavonia Dana M.D.   On: 11/23/2021 12:17   Result Date: 11/23/2021 CLINICAL DATA:  Screening, chronic kidney disease, type II diabetes mellitus, hypertension EXAM: PORTABLE CHEST 1 VIEW COMPARISON:  Portable exam 1107 hours compared to 10/23/2021 FINDINGS: RIGHT jugular Port-A-Cath with tip projecting over cavoatrial junction. Enlargement of cardiac silhouette. Mediastinal contours and pulmonary vascularity normal. Atherosclerotic calcification aorta. Minimal bibasilar atelectasis. Lungs otherwise clear. No acute infiltrate, pleural effusion, or pneumothorax. No acute osseous findings. IMPRESSION: Minimal bibasilar atelectasis. Aortic Atherosclerosis (ICD10-I70.0). Electronically Signed: By: Lavonia Dana M.D. On: 11/23/2021 11:18   [place] LT UPPER EXTREM LTD SOFT TISSUE NON VASCULAR  Result Date: 11/19/2021 CLINICAL DATA:  Left arm  pain, redness, and swelling. EXAM: ULTRASOUND LEFT UPPER EXTREMITY LIMITED TECHNIQUE: Ultrasound examination of the upper extremity soft tissues was performed in the area of clinical concern. COMPARISON:  None Available. FINDINGS: Focal ultrasound of the areas of concern in the left upper arm and forearm demonstrate no discrete soft tissue mass or fluid collection. IMPRESSION: 1. Negative. No sonographic abnormality at the sites of the palpable abnormalities confirmed by the patient. Electronically Signed   By: Titus Dubin M.D.   On: 11/19/2021 13:35   US Venous Img Upper Uni Left (DVT)  Result Date: 11/08/2021 CLINICAL DATA:  Left upper extremity pain and edema. Patient is anticoagulation. Evaluate for DVT. End-stage renal disease with right jugular approach dialysis catheter. EXAM: LEFT UPPER EXTREMITY VENOUS DOPPLER ULTRASOUND TECHNIQUE: Gray-scale sonography with graded compression, as well as color Doppler and duplex ultrasound were performed to evaluate the upper extremity deep venous system from the level of the subclavian vein and including the jugular, axillary, basilic, radial, ulnar and upper cephalic vein. Spectral Doppler was utilized to evaluate flow at rest and with distal augmentation maneuvers. COMPARISON:  None Available. FINDINGS: Contralateral Subclavian Vein: Respiratory phasicity is normal and symmetric with the symptomatic side. No evidence of thrombus. Normal compressibility. Internal Jugular Vein: No  evidence of thrombus. Normal compressibility, respiratory phasicity and response to augmentation. Subclavian Vein: No evidence of thrombus. Normal compressibility, respiratory phasicity and response to augmentation. Axillary Vein: No evidence of thrombus. Normal compressibility, respiratory phasicity and response to augmentation. Cephalic Vein: No evidence of thrombus. Normal compressibility, respiratory phasicity and response to augmentation. Basilic Vein: No evidence of thrombus.  Normal compressibility, respiratory phasicity and response to augmentation. Brachial Veins: No evidence of thrombus. Normal compressibility, respiratory phasicity and response to augmentation. Radial Veins: No evidence of thrombus. Normal compressibility, respiratory phasicity and response to augmentation. Ulnar Veins: No evidence of thrombus. Normal compressibility, respiratory phasicity and response to augmentation. Other Findings: There is a minimal amount of subcutaneous edema at the level forearm. IMPRESSION: No evidence of DVT within the left upper extremity. Electronically Signed   By: Sandi Mariscal M.D.   On: 11/08/2021 16:32   DG Swallowing Func-Speech Pathology  Result Date: 11/03/2021 Table formatting from the original result was not included. Objective Swallowing Evaluation: Type of Study: MBS-Modified Barium Swallow Study  Patient Details Name: Avaree Gilberti MRN: [id number] Date of Birth: 07-Nov-1955 Today's Date: 11/03/2021 Time: SLP Start Time (ACUTE ONLY): 1115 -SLP Stop Time (ACUTE ONLY): 1215 SLP Time Calculation (min) (ACUTE ONLY): 60 min Past Medical History: Past Medical History: Diagnosis Date  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[place])   Hypertension   Renal disorder   Type 2 diabetes mellitus ([place])  Past Surgical History: Past Surgical History: Procedure Laterality Date  APPENDECTOMY    AV FISTULA PLACEMENT Left 03/16/2021  Procedure: INSERTION OF ARTERIOVENOUS (AV) GORE-TEX GRAFT ARM;  Surgeon: Algernon Huxley, MD;  Location: [facility] ORS;  Service: Vascular;  Laterality: Left;  DIALYSIS/PERMA CATHETER INSERTION N/A 05/16/2020  Procedure: DIALYSIS/PERMA CATHETER INSERTION;  Surgeon: Algernon Huxley, MD;  Location: [place] [facility];  Service: Cardiovascular;  Laterality: N/A;  MANDIBLE SURGERY    RENAL BIOPSY Right   RIGHT HEART CATH N/A 05/10/2020  Procedure: RIGHT HEART CATH;  Surgeon: Nelva Bush, MD;  Location: [facility];  Service: Cardiovascular;  Laterality: N/A;  TEMPORARY  DIALYSIS CATHETER N/A 05/11/2020  Procedure: TEMPORARY DIALYSIS CATHETER;  Surgeon: Algernon Huxley, MD;  Location: [facility];  Service: Cardiovascular;  Laterality: N/A; HPI: Pt  is a 66 y.o. female with medical history significant of ESRD, HD, vision deficits reported by pt, DM II, Homelessness. Had been admitted 05/13-05/17 for sepsis d/t cellulits from animal bite to arm, discharged on po abx and was unable to fill the Rx, was kicked out of where she was living, no way to get HD.  Patient has been in the ED since arriving the hospital.  She was admitted to the hospital again on 10/23/2021 as patient developed cough short of breath and fever.  Chest x-ray showed pneumonia.  Patient was diagnosed with sepsis and pneumonia, was started on cefepime and vancomycin.  Had MRI performed on 6/14 did not show any osteomyelitis.  Patient condition appears to be improving.  Patient has been seen by ID, currently on vancomycin and cefepime.  Patient no longer has any fever, blood culture has been negative.   Patient is currently homeless, has a severe weakness.  No discharge option at this time.   CXR on 10/23/21: bibasilar airspace disease, left greater than right, with  persistent small pleural effusions.  Subjective: pt was verbal and talkative. Pleasant and followed instructions. Vision deficits reported.  Recommendations for follow up therapy are one component of a multi-disciplinary  discharge planning process, led by the attending physician.  Recommendations may be updated based on patient status, additional functional criteria and insurance authorization. Assessment / Plan / Recommendation   11/01/2021   2:00 PM Clinical Impressions SLP Visit Diagnosis Dysphagia, unspecified (R13.10) Impact on safety and function --     11/01/2021   2:00 PM Treatment Recommendations Treatment Recommendations No treatment recommended at this time     11/01/2021   2:00 PM Prognosis Prognosis for Safe Diet Advancement Good Barriers to  Reach Goals Time post onset;Severity of deficits   11/01/2021   2:00 PM Diet Recommendations SLP Diet Recommendations Regular solids;Thin liquid Liquid Administration via Cup;Straw Medication Administration Whole meds with liquid Compensations Minimize environmental distractions;Slow rate;Small sips/bites;Lingual sweep for clearance of pocketing;Follow solids with liquid Postural Changes Remain semi-upright after after feeds/meals (Comment);Seated upright at 90 degrees     11/01/2021   2:00 PM Other Recommendations Recommended Consults Consider GI evaluation;Consider esophageal assessment Oral Care Recommendations Oral care BID;Oral care before and after PO;Patient independent with oral care Other Recommendations -- Follow Up Recommendations No SLP follow up Assistance recommended at discharge PRN Functional Status Assessment Patient has not had a recent decline in their functional status   11/01/2021   2:00 PM Frequency and Duration  Speech Therapy Frequency (ACUTE ONLY) -- Treatment Duration --     11/01/2021   2:00 PM Oral Phase Oral Phase [facility]    11/01/2021   2:00 PM Pharyngeal Phase Pharyngeal Phase [facility]    11/01/2021   2:00 PM Cervical Esophageal Phase  Cervical Esophageal Phase [facility] Happi Overton 11/03/2021, 2:29 PM                     DG Abd 1 View  Result Date: 10/29/2021 CLINICAL DATA:  Fecal impaction with abdominal pain and distension. EXAM: ABDOMEN - 1 VIEW COMPARISON:  10/26/2021 FINDINGS: Examination demonstrates air throughout the colon with near complete clearance of previously seen moderate fecal retention. No dilated bowel loops. No air-fluid levels or free peritoneal air. Slight improvement left base opacification partially visualized central venous catheter over the SVC. Remainder of the exam is unchanged. IMPRESSION: Nonobstructive bowel gas pattern with near complete clearance of previously seen moderate fecal retention. Electronically Signed   By: Marin Olp M.D.   On: 10/29/2021 12:09   (Echo,  Carotid, EGD, Colonoscopy, ERCP)    Subjective: Seen and examined.  Excited to get out of the hospital.  Very appreciative of care.  Discharge Exam: Vitals:   11/25/21 2032 11/26/21 0653  BP: (!) 100/55 (!) 90/47  Pulse: 72 73  Resp: 19 17  Temp: 98.4 F (36.9 C) 98.1 F (36.7 C)  SpO2: 96% 98%   Vitals:   11/25/21 1647 11/25/21 2032 11/26/21 0500 11/26/21 0653  BP: 124/61 (!) 100/55  (!) 90/47  Pulse: 72 72  73  Resp: 16 19  17   Temp: 98.2 F (36.8 C) 98.4 F (36.9 C)  98.1 F (36.7 C)  TempSrc:      SpO2: 97% 96%  98%  Weight:   76 kg   Height:        General: Pt is alert, awake, not in acute distress On room air.  Looks fairly comfortable. Permacath present right IJ. Cardiovascular: RRR, S1/S2 +, no rubs, no gallops Respiratory: CTA bilaterally, no wheezing, no rhonchi Abdominal: Soft, NT, ND, bowel sounds + Extremities: no edema, no cyanosis    The results of significant diagnostics from this hospitalization (including imaging, microbiology,  ancillary and laboratory) are listed below for reference.     Microbiology: No results found for this or any previous visit (from the past 240 hour(s)).   Labs: BNP (last 3 results) Recent Labs    03/07/21 1114  BNP 50.2    Basic Metabolic Panel: Recent Labs  Lab 11/22/21 0850 11/25/21 0800  NA 137 135  K 4.1 4.4  CL 102 102  CO2 27 25  GLUCOSE 103* 105*  BUN 65* 73*  CREATININE 6.46* 7.21*  CALCIUM 8.5* 8.6*  PHOS 5.4*  --     Liver Function Tests: Recent Labs  Lab 11/22/21 0850 11/25/21 0800  AST  --  19  ALT  --  15  ALKPHOS  --  84  BILITOT  --  0.6  PROT  --  6.0*  ALBUMIN 2.8* 2.8*    No results for input(s): "LIPASE", "AMYLASE" in the last 168 hours. No results for input(s): "AMMONIA" in the last 168 hours. CBC: Recent Labs  Lab 11/22/21 0850 11/25/21 0800  WBC 7.3 7.9  NEUTROABS  --  5.5  HGB 10.3* 10.2*  HCT 33.9* 32.8*  MCV 97.1 97.3  PLT 281 230    Cardiac  Enzymes: No results for input(s): "CKTOTAL", "CKMB", "CKMBINDEX", "TROPONINI" in the last 168 hours. BNP: Invalid input(s): "POCBNP" CBG: Recent Labs  Lab 11/23/21 0741 11/24/21 0742 11/24/21 1544 11/25/21 1648 11/26/21 0754  GLUCAP 94 95 79 95 105*    D-Dimer No results for input(s): "DDIMER" in the last 72 hours. Hgb A1c No results for input(s): "HGBA1C" in the last 72 hours. Lipid Profile No results for input(s): "CHOL", "HDL", "LDLCALC", "TRIG", "CHOLHDL", "LDLDIRECT" in the last 72 hours. Thyroid function studies No results for input(s): "TSH", "T4TOTAL", "T3FREE", "THYROIDAB" in the last 72 hours.  Invalid input(s): "FREET3" Anemia work up No results for input(s): "VITAMINB12", "FOLATE", "FERRITIN", "TIBC", "IRON", "RETICCTPCT" in the last 72 hours. Urinalysis    Component Value Date/Time   COLORURINE YELLOW (A) 09/23/2021 2143   APPEARANCEUR HAZY (A) 09/23/2021 2143   LABSPEC 1.012 09/23/2021 2143   PHURINE 9.0 (H) 09/23/2021 2143   GLUCOSEU 150 (A) 09/23/2021 2143   HGBUR NEGATIVE 09/23/2021 2143   BILIRUBINUR NEGATIVE 09/23/2021 2143   KETONESUR 5 (A) 09/23/2021 2143   PROTEINUR 100 (A) 09/23/2021 2143   NITRITE NEGATIVE 09/23/2021 2143   LEUKOCYTESUR LARGE (A) 09/23/2021 2143   Sepsis Labs Recent Labs  Lab 11/22/21 0850 11/25/21 0800  WBC 7.3 7.9    Microbiology No results found for this or any previous visit (from the past 240 hour(s)).   Time coordinating discharge: 35 minutes  SIGNED:   Vanna Scotland, MD  [facility] 11/26/2021, 8:07 AM

## 2021-11-26 NOTE — TOC Progression Note (Signed)
Transition of Care [facility]) - Progression Note    Patient Details  Name: Katie [name] MRN: [id number] Date of Birth: [birth year]  [facility]) CM/SW Contact  Izola Price, RN Phone Number: 11/26/2021, 3:17 PM  Clinical Narrative: 7/16: Discharge 7/17 planned to [facility]. DC Summary inboxed via HUB and faxed to number in destination hub. EMS forms printed to unit and EMS tentatively arranged to pick up at 0930 (earliest). Unit RN aware and will prepare on their end. Development worker, international aid will need to be called at 8 am to confirm room number and transfer per facility staff on Saturday. Simmie Davies RN CM 320 pm.       Expected Discharge Plan: Skilled Nursing Facility Barriers to Discharge: SNF Pending bed offer  Expected Discharge Plan and Services Expected Discharge Plan: [place]   Discharge Planning Services: CM Consult Post Acute Care Choice: [place] Living arrangements for the past 2 months: Single Family Home Expected Discharge Date: 11/23/21               DME Arranged: N/A DME Agency: NA       HH Arranged: NA HH Agency: NA         Social Determinants of Health (SDOH) Interventions    Readmission Risk Interventions    09/27/2021    1:44 PM 09/26/2021   12:46 PM 03/08/2021    2:49 PM  Readmission Risk Prevention Plan  Transportation Screening  Complete Complete  Palliative Care Screening Not Applicable    Medication Review (RN Care Manager)  Complete Complete  HRI or Home Care Consult   Complete  SW Recovery Care/Counseling Consult  Complete   Palliative Care Screening  Not [place]  Not Applicable Complete

## 2021-11-27 LAB — SARS CORONAVIRUS 2 (TAT 6-24 HRS): SARS Coronavirus 2: NEGATIVE

## 2021-11-27 LAB — HEPATITIS B SURFACE ANTIBODY, QUANTITATIVE: Hep B S AB Quant (Post): 4 m[IU]/mL — ABNORMAL LOW (ref >9.9)

## 2021-11-27 LAB — GLUCOSE, CAPILLARY: Glucose-Capillary: 99 mg/dL (ref 70–99)

## 2021-11-27 NOTE — Discharge Summary (Signed)
Physician Discharge Summary  Katie [name] UVO:[id number] DOB: 1955-10-23 DOA: 09/30/2021  PCP: Pcp, No  Admit date: 09/30/2021 Discharge date: 11/27/2021  Admitted From: Home Disposition: Assisted living facility  Recommendations for Outpatient Follow-up:  Continue outpatient hemodialysis as a scheduled  Home Health: N/A Equipment/Devices: N/A  Discharge Condition: Fair CODE STATUS: Full code Diet recommendation: Low-salt diet  Discharge summary: 66 year old with history of ESRD on hemodialysis, type 2 diabetes, homelessness admitted on 10/23/2021 with cough and shortness of breath and fever and chest x-ray consistent with pneumonia.  She was admitted and diagnosed with sepsis and pneumonia, treated with antibiotics.  Patient is currently homeless, she had severe weakness.  Remained in the hospital due to not having safe disposition.  Receiving dialysis on her schedule. After 64 days in the hospital, she is going to be discharged to an assisted living facility and continue medical care and dialysis.  ESRD on hemodialysis: Receiving dialysis on schedule.  Outpatient dialysis is schedule as per orders.  Upper extremity cellulitis/sepsis from pneumonia improved  Fecal impaction constipation: Improved.  Keep on a scheduled stool softeners and laxatives.  Also on Metamucil.  Essential hypertension: Tolerating carvedilol and Lasix.  She does get intradialytic hypotension, orthostatic precautions on the day of dialysis.  Type 2 diabetes: Diet controlled.  Does not need any medications.  Anxiety and depression: Using trazodone at night for sleep, using Celexa for anxiety.  Symptoms well controlled.  Back pain and arm pain: Using low-dose Percocet and Zanaflex that will be prescribed.  Patient is stable to discharge to supervised level of care. Initially intended to DC on 7/14 but unprecedented delay with ALF, now patient remains stable for DC and plan for 7/17 Am at 0900.  Discharge  Diagnoses:  Principal Problem:   Adjustment disorder with mixed anxiety and depressed mood Active Problems:   Cellulitis   Hypertension   Type 2 diabetes mellitus (HCC)   ESRD (end stage renal disease) (HCC)   Financial difficulty   Anemia in chronic kidney disease   Sepsis ([place])   Bent bone fracture of ulna   Hospital acquired PNA   Fecal impaction (HCC)   Aortic stenosis   Left arm cellulitis    Discharge Instructions  Discharge Instructions     Diet - low sodium heart healthy   Complete by: As directed    Increase activity slowly   Complete by: As directed    No wound care   Complete by: As directed    Place in ED Boarder Status   Complete by: As directed       Allergies as of 11/27/2021       Reactions   Penicillins Nausea And Vomiting   Pt states "only pills" make her vomit Other reaction(s): Nausea/Vomit        Medication List     STOP taking these medications    amLODipine 10 MG tablet Commonly known as: NORVASC   amoxicillin-clavulanate 250-125 MG tablet Commonly known as: AUGMENTIN   calcium acetate 667 MG capsule Commonly known as: PHOSLO   lactulose 10 GM/15ML solution Commonly known as: CHRONULAC   lidocaine 5 % Commonly known as: LIDODERM   lidocaine-prilocaine cream Commonly known as: EMLA   venlafaxine 37.5 MG tablet Commonly known as: EFFEXOR       TAKE these medications    acetaminophen 500 MG tablet Commonly known as: TYLENOL Take 500 mg by mouth every 6 (six) hours as needed for moderate pain.   carvedilol 6.25 MG tablet Commonly known  as: COREG Take 1 tablet (6.25 mg total) by mouth every evening. What changed:  medication strength how much to take   citalopram 10 MG tablet Commonly known as: CELEXA Take 1 tablet (10 mg total) by mouth daily.   furosemide 40 MG tablet Commonly known as: LASIX Take 1 tablet (40 mg total) by mouth daily.   ondansetron 4 MG disintegrating tablet Commonly known as:  ZOFRAN-ODT Take 1 tablet (4 mg total) by mouth every 8 (eight) hours as needed for nausea or vomiting.   oxyCODONE-acetaminophen 5-325 MG tablet Commonly known as: PERCOCET/ROXICET Take 1 tablet by mouth every 8 (eight) hours as needed for moderate pain or severe pain. What changed:  how much to take when to take this   pantoprazole 40 MG tablet Commonly known as: PROTONIX Take 1 tablet (40 mg total) by mouth 2 (two) times daily.   senna-docusate 8.6-50 MG tablet Commonly known as: Senokot-S Take 1 tablet by mouth 2 (two) times daily.   tiZANidine 4 MG tablet Commonly known as: ZANAFLEX Take 4 mg by mouth 2 (two) times daily as needed.   topiramate 50 MG tablet Commonly known as: TOPAMAX Take 1 tablet (50 mg total) by mouth 2 (two) times daily as needed (headache).   traZODone 50 MG tablet Commonly known as: DESYREL Take 1 tablet (50 mg total) by mouth every evening.        Allergies  Allergen Reactions   Penicillins Nausea And Vomiting    Pt states "only pills" make her vomit Other reaction(s): Nausea/Vomit    Consultations: Multiple   Procedures/Studies: DG Chest Port 1 View  Addendum Date: 11/23/2021   ADDENDUM REPORT: 11/23/2021 12:17 ADDENDUM: Requested No radiographic evidence of TB identified. Electronically Signed   By: Lavonia Dana M.D.   On: 11/23/2021 12:17   Result Date: 11/23/2021 CLINICAL DATA:  Screening, chronic kidney disease, type II diabetes mellitus, hypertension EXAM: PORTABLE CHEST 1 VIEW COMPARISON:  Portable exam 1107 hours compared to 10/23/2021 FINDINGS: RIGHT jugular Port-A-Cath with tip projecting over cavoatrial junction. Enlargement of cardiac silhouette. Mediastinal contours and pulmonary vascularity normal. Atherosclerotic calcification aorta. Minimal bibasilar atelectasis. Lungs otherwise clear. No acute infiltrate, pleural effusion, or pneumothorax. No acute osseous findings. IMPRESSION: Minimal bibasilar atelectasis. Aortic  Atherosclerosis (ICD10-I70.0). Electronically Signed: By: Lavonia Dana M.D. On: 11/23/2021 11:18   [place] LT UPPER EXTREM LTD SOFT TISSUE NON VASCULAR  Result Date: 11/19/2021 CLINICAL DATA:  Left arm pain, redness, and swelling. EXAM: ULTRASOUND LEFT UPPER EXTREMITY LIMITED TECHNIQUE: Ultrasound examination of the upper extremity soft tissues was performed in the area of clinical concern. COMPARISON:  None Available. FINDINGS: Focal ultrasound of the areas of concern in the left upper arm and forearm demonstrate no discrete soft tissue mass or fluid collection. IMPRESSION: 1. Negative. No sonographic abnormality at the sites of the palpable abnormalities confirmed by the patient. Electronically Signed   By: Titus Dubin M.D.   On: 11/19/2021 13:35   US Venous Img Upper Uni Left (DVT)  Result Date: 11/08/2021 CLINICAL DATA:  Left upper extremity pain and edema. Patient is anticoagulation. Evaluate for DVT. End-stage renal disease with right jugular approach dialysis catheter. EXAM: LEFT UPPER EXTREMITY VENOUS DOPPLER ULTRASOUND TECHNIQUE: Gray-scale sonography with graded compression, as well as color Doppler and duplex ultrasound were performed to evaluate the upper extremity deep venous system from the level of the subclavian vein and including the jugular, axillary, basilic, radial, ulnar and upper cephalic vein. Spectral Doppler was utilized to evaluate flow at rest  and with distal augmentation maneuvers. COMPARISON:  None Available. FINDINGS: Contralateral Subclavian Vein: Respiratory phasicity is normal and symmetric with the symptomatic side. No evidence of thrombus. Normal compressibility. Internal Jugular Vein: No evidence of thrombus. Normal compressibility, respiratory phasicity and response to augmentation. Subclavian Vein: No evidence of thrombus. Normal compressibility, respiratory phasicity and response to augmentation. Axillary Vein: No evidence of thrombus. Normal compressibility, respiratory  phasicity and response to augmentation. Cephalic Vein: No evidence of thrombus. Normal compressibility, respiratory phasicity and response to augmentation. Basilic Vein: No evidence of thrombus. Normal compressibility, respiratory phasicity and response to augmentation. Brachial Veins: No evidence of thrombus. Normal compressibility, respiratory phasicity and response to augmentation. Radial Veins: No evidence of thrombus. Normal compressibility, respiratory phasicity and response to augmentation. Ulnar Veins: No evidence of thrombus. Normal compressibility, respiratory phasicity and response to augmentation. Other Findings: There is a minimal amount of subcutaneous edema at the level forearm. IMPRESSION: No evidence of DVT within the left upper extremity. Electronically Signed   By: Sandi Mariscal M.D.   On: 11/08/2021 16:32   DG Swallowing Func-Speech Pathology  Result Date: 11/03/2021 Table formatting from the original result was not included. Objective Swallowing Evaluation: Type of Study: MBS-Modified Barium Swallow Study  Patient Details Name: Kenetha Cozza MRN: [id number] Date of Birth: 12-11-1955 Today's Date: 11/03/2021 Time: SLP Start Time (ACUTE ONLY): 1115 -SLP Stop Time (ACUTE ONLY): 1215 SLP Time Calculation (min) (ACUTE ONLY): 60 min Past Medical History: Past Medical History: Diagnosis Date  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[place])   Hypertension   Renal disorder   Type 2 diabetes mellitus ([place])  Past Surgical History: Past Surgical History: Procedure Laterality Date  APPENDECTOMY    AV FISTULA PLACEMENT Left 03/16/2021  Procedure: INSERTION OF ARTERIOVENOUS (AV) GORE-TEX GRAFT ARM;  Surgeon: Algernon Huxley, MD;  Location: [facility];  Service: Vascular;  Laterality: Left;  DIALYSIS/PERMA CATHETER INSERTION N/A 05/16/2020  Procedure: DIALYSIS/PERMA CATHETER INSERTION;  Surgeon: Algernon Huxley, MD;  Location: [place] [facility];  Service: Cardiovascular;  Laterality: N/A;  MANDIBLE SURGERY    RENAL  BIOPSY Right   RIGHT HEART CATH N/A 05/10/2020  Procedure: RIGHT HEART CATH;  Surgeon: Nelva Bush, MD;  Location: [place] [facility];  Service: Cardiovascular;  Laterality: N/A;  TEMPORARY DIALYSIS CATHETER N/A 05/11/2020  Procedure: TEMPORARY DIALYSIS CATHETER;  Surgeon: Algernon Huxley, MD;  Location: [facility];  Service: Cardiovascular;  Laterality: N/A; HPI: Pt  is a 66 y.o. female with medical history significant of ESRD, HD, vision deficits reported by pt, DM II, Homelessness. Had been admitted 05/13-05/17 for sepsis d/t cellulits from animal bite to arm, discharged on po abx and was unable to fill the Rx, was kicked out of where she was living, no way to get HD.  Patient has been in the ED since arriving the hospital.  She was admitted to the hospital again on 10/23/2021 as patient developed cough short of breath and fever.  Chest x-ray showed pneumonia.  Patient was diagnosed with sepsis and pneumonia, was started on cefepime and vancomycin.  Had MRI performed on 6/14 did not show any osteomyelitis.  Patient condition appears to be improving.  Patient has been seen by ID, currently on vancomycin and cefepime.  Patient no longer has any fever, blood culture has been negative.   Patient is currently homeless, has a severe weakness.  No discharge option at this time.   CXR on 10/23/21: bibasilar airspace disease, left greater than  right, with  persistent small pleural effusions.  Subjective: pt was verbal and talkative. Pleasant and followed instructions. Vision deficits reported.  Recommendations for follow up therapy are one component of a multi-disciplinary discharge planning process, led by the attending physician.  Recommendations may be updated based on patient status, additional functional criteria and insurance authorization. Assessment / Plan / Recommendation   11/01/2021   2:00 PM Clinical Impressions SLP Visit Diagnosis Dysphagia, unspecified (R13.10) Impact on safety and function --      11/01/2021   2:00 PM Treatment Recommendations Treatment Recommendations No treatment recommended at this time     11/01/2021   2:00 PM Prognosis Prognosis for Safe Diet Advancement Good Barriers to Reach Goals Time post onset;Severity of deficits   11/01/2021   2:00 PM Diet Recommendations SLP Diet Recommendations Regular solids;Thin liquid Liquid Administration via Cup;Straw Medication Administration Whole meds with liquid Compensations Minimize environmental distractions;Slow rate;Small sips/bites;Lingual sweep for clearance of pocketing;Follow solids with liquid Postural Changes Remain semi-upright after after feeds/meals (Comment);Seated upright at 90 degrees     11/01/2021   2:00 PM Other Recommendations Recommended Consults Consider GI evaluation;Consider esophageal assessment Oral Care Recommendations Oral care BID;Oral care before and after PO;Patient independent with oral care Other Recommendations -- Follow Up Recommendations No SLP follow up Assistance recommended at discharge PRN Functional Status Assessment Patient has not had a recent decline in their functional status   11/01/2021   2:00 PM Frequency and Duration  Speech Therapy Frequency (ACUTE ONLY) -- Treatment Duration --     11/01/2021   2:00 PM Oral Phase Oral Phase [facility]    11/01/2021   2:00 PM Pharyngeal Phase Pharyngeal Phase [facility]    11/01/2021   2:00 PM Cervical Esophageal Phase  Cervical Esophageal Phase [facility] Happ[facility] Overton 11/03/2021, 2:29 PM                     DG Abd 1 View  Result Date: 10/29/2021 CLINICAL DATA:  Fecal impaction with abdominal pain and distension. EXAM: ABDOMEN - 1 VIEW COMPARISON:  10/26/2021 FINDINGS: Examination demonstrates air throughout the colon with near complete clearance of previously seen moderate fecal retention. No dilated bowel loops. No air-fluid levels or free peritoneal air. Slight improvement left base opacification partially visualized central venous catheter over the SVC. Remainder of the exam is unchanged.  IMPRESSION: Nonobstructive bowel gas pattern with near complete clearance of previously seen moderate fecal retention. Electronically Signed   By: Marin Olp M.D.   On: 10/29/2021 12:09   (Echo, Carotid, EGD, Colonoscopy, ERCP)    Subjective: Seen and examined.  Excited to get out of the hospital.  Very appreciative of care.  Discharge Exam: Vitals:   11/26/21 2149 11/27/21 0521  BP: (!) 155/74 (!) 168/66  Pulse: 70 73  Resp: 16 16  Temp: 98.5 F (36.9 C) 98.3 F (36.8 C)  SpO2: 97% 93%   Vitals:   11/26/21 1659 11/26/21 2149 11/27/21 0500 11/27/21 0521  BP: (!) 149/67 (!) 155/74  (!) 168/66  Pulse: 72 70  73  Resp: 16 16  16   Temp: 98.3 F (36.8 C) 98.5 F (36.9 C)  98.3 F (36.8 C)  TempSrc:      SpO2: 96% 97%  93%  Weight:   78 kg   Height:        General: Pt is alert, awake, not in acute distress On room air.  Looks fairly comfortable. Permacath present right IJ. Cardiovascular: RRR, S1/S2 +, no rubs, no  gallops Respiratory: CTA bilaterally, no wheezing, no rhonchi Abdominal: Soft, NT, ND, bowel sounds + Extremities: no edema, no cyanosis    The results of significant diagnostics from this hospitalization (including imaging, microbiology, ancillary and laboratory) are listed below for reference.     Microbiology: No results found for this or any previous visit (from the past 240 hour(s)).   Labs: BNP (last 3 results) Recent Labs    03/07/21 1114  BNP 09.3    Basic Metabolic Panel: Recent Labs  Lab 11/22/21 0850 11/25/21 0800  NA 137 135  K 4.1 4.4  CL 102 102  CO2 27 25  GLUCOSE 103* 105*  BUN 65* 73*  CREATININE 6.46* 7.21*  CALCIUM 8.5* 8.6*  PHOS 5.4*  --     Liver Function Tests: Recent Labs  Lab 11/22/21 0850 11/25/21 0800  AST  --  19  ALT  --  15  ALKPHOS  --  84  BILITOT  --  0.6  PROT  --  6.0*  ALBUMIN 2.8* 2.8*    No results for input(s): "LIPASE", "AMYLASE" in the last 168 hours. No results for input(s):  "AMMONIA" in the last 168 hours. CBC: Recent Labs  Lab 11/22/21 0850 11/25/21 0800  WBC 7.3 7.9  NEUTROABS  --  5.5  HGB 10.3* 10.2*  HCT 33.9* 32.8*  MCV 97.1 97.3  PLT 281 230    Cardiac Enzymes: No results for input(s): "CKTOTAL", "CKMB", "CKMBINDEX", "TROPONINI" in the last 168 hours. BNP: Invalid input(s): "POCBNP" CBG: Recent Labs  Lab 11/24/21 0742 11/24/21 1544 11/25/21 1648 11/26/21 0754 11/26/21 1828  GLUCAP 95 79 95 105* 149*    D-Dimer No results for input(s): "DDIMER" in the last 72 hours. Hgb A1c No results for input(s): "HGBA1C" in the last 72 hours. Lipid Profile No results for input(s): "CHOL", "HDL", "LDLCALC", "TRIG", "CHOLHDL", "LDLDIRECT" in the last 72 hours. Thyroid function studies No results for input(s): "TSH", "T4TOTAL", "T3FREE", "THYROIDAB" in the last 72 hours.  Invalid input(s): "FREET3" Anemia work up No results for input(s): "VITAMINB12", "FOLATE", "FERRITIN", "TIBC", "IRON", "RETICCTPCT" in the last 72 hours. Urinalysis    Component Value Date/Time   COLORURINE YELLOW (A) 09/23/2021 2143   APPEARANCEUR HAZY (A) 09/23/2021 2143   LABSPEC 1.012 09/23/2021 2143   PHURINE 9.0 (H) 09/23/2021 2143   GLUCOSEU 150 (A) 09/23/2021 2143   HGBUR NEGATIVE 09/23/2021 2143   BILIRUBINUR NEGATIVE 09/23/2021 2143   KETONESUR 5 (A) 09/23/2021 2143   PROTEINUR 100 (A) 09/23/2021 2143   NITRITE NEGATIVE 09/23/2021 2143   LEUKOCYTESUR LARGE (A) 09/23/2021 2143   Sepsis Labs Recent Labs  Lab 11/22/21 0850 11/25/21 0800  WBC 7.3 7.9    Microbiology No results found for this or any previous visit (from the past 240 hour(s)).   Time coordinating discharge: 35 minutes  SIGNED:   Vanna Scotland, MD  [facility] 11/27/2021, 8:22 AM

## 2021-11-27 NOTE — Progress Notes (Signed)
Report called to [place] at [facility] and all questions answered.  Transport was set up yesterday for 0930.  Patient's belongings gathered, patient dressed and ready for transport.  AVS ready for EMS transport.

## 2021-12-06 ENCOUNTER — Encounter: Payer: Self-pay | Admitting: Student

## 2021-12-06 NOTE — Progress Notes (Signed)
Palliative NP attempted initial visit today. Patient declines visit as she states she has been up vomiting during the night. Staff had just given nausea medication. She is agreeable to visit next week. PCP notified.

## 2021-12-11 ENCOUNTER — Emergency Department: Payer: Medicaid Other

## 2021-12-11 ENCOUNTER — Other Ambulatory Visit: Payer: Self-pay

## 2021-12-11 ENCOUNTER — Inpatient Hospital Stay
Admission: EM | Admit: 2021-12-11 | Discharge: 2021-12-25 | DRG: 252 | Disposition: A | Discharge status: Skilled Nursing Facility | Payer: Medicaid Other | Source: Skilled Nursing Facility | Attending: Internal Medicine | Admitting: Internal Medicine

## 2021-12-11 DIAGNOSIS — E872 Acidosis, unspecified: Secondary | ICD-10-CM | POA: Diagnosis not present

## 2021-12-11 DIAGNOSIS — I871 Compression of vein: Secondary | ICD-10-CM | POA: Diagnosis present

## 2021-12-11 DIAGNOSIS — L03114 Cellulitis of left upper limb: Secondary | ICD-10-CM | POA: Diagnosis present

## 2021-12-11 DIAGNOSIS — K5900 Constipation, unspecified: Secondary | ICD-10-CM | POA: Diagnosis present

## 2021-12-11 DIAGNOSIS — Z87891 Personal history of nicotine dependence: Secondary | ICD-10-CM

## 2021-12-11 DIAGNOSIS — Z5900 Homelessness unspecified: Secondary | ICD-10-CM | POA: Diagnosis not present

## 2021-12-11 DIAGNOSIS — T82510A Breakdown (mechanical) of surgically created arteriovenous fistula, initial encounter: Secondary | ICD-10-CM | POA: Diagnosis present

## 2021-12-11 DIAGNOSIS — I350 Nonrheumatic aortic (valve) stenosis: Secondary | ICD-10-CM | POA: Diagnosis present

## 2021-12-11 DIAGNOSIS — D631 Anemia in chronic kidney disease: Secondary | ICD-10-CM | POA: Diagnosis present

## 2021-12-11 DIAGNOSIS — Z8701 Personal history of pneumonia (recurrent): Secondary | ICD-10-CM

## 2021-12-11 DIAGNOSIS — F419 Anxiety disorder, unspecified: Secondary | ICD-10-CM | POA: Diagnosis present

## 2021-12-11 DIAGNOSIS — E1122 Type 2 diabetes mellitus with diabetic chronic kidney disease: Secondary | ICD-10-CM | POA: Diagnosis present

## 2021-12-11 DIAGNOSIS — I132 Hypertensive heart and chronic kidney disease with heart failure and with stage 5 chronic kidney disease, or end stage renal disease: Secondary | ICD-10-CM | POA: Diagnosis present

## 2021-12-11 DIAGNOSIS — J9811 Atelectasis: Secondary | ICD-10-CM | POA: Diagnosis present

## 2021-12-11 DIAGNOSIS — B964 Proteus (mirabilis) (morganii) as the cause of diseases classified elsewhere: Secondary | ICD-10-CM | POA: Diagnosis present

## 2021-12-11 DIAGNOSIS — Z801 Family history of malignant neoplasm of trachea, bronchus and lung: Secondary | ICD-10-CM | POA: Diagnosis not present

## 2021-12-11 DIAGNOSIS — R8271 Bacteriuria: Secondary | ICD-10-CM | POA: Diagnosis present

## 2021-12-11 DIAGNOSIS — I890 Lymphedema, not elsewhere classified: Secondary | ICD-10-CM | POA: Diagnosis present

## 2021-12-11 DIAGNOSIS — R7989 Other specified abnormal findings of blood chemistry: Secondary | ICD-10-CM | POA: Diagnosis present

## 2021-12-11 DIAGNOSIS — I10 Essential (primary) hypertension: Secondary | ICD-10-CM | POA: Diagnosis present

## 2021-12-11 DIAGNOSIS — T82858A Stenosis of vascular prosthetic devices, implants and grafts, initial encounter: Secondary | ICD-10-CM | POA: Diagnosis not present

## 2021-12-11 DIAGNOSIS — I5032 Chronic diastolic (congestive) heart failure: Secondary | ICD-10-CM | POA: Diagnosis present

## 2021-12-11 DIAGNOSIS — Z992 Dependence on renal dialysis: Secondary | ICD-10-CM

## 2021-12-11 DIAGNOSIS — Z820 Family history of epilepsy and other diseases of the nervous system: Secondary | ICD-10-CM

## 2021-12-11 DIAGNOSIS — N2581 Secondary hyperparathyroidism of renal origin: Secondary | ICD-10-CM | POA: Diagnosis present

## 2021-12-11 DIAGNOSIS — Z8049 Family history of malignant neoplasm of other genital organs: Secondary | ICD-10-CM

## 2021-12-11 DIAGNOSIS — N186 End stage renal disease: Secondary | ICD-10-CM

## 2021-12-11 DIAGNOSIS — Z79899 Other long term (current) drug therapy: Secondary | ICD-10-CM | POA: Diagnosis not present

## 2021-12-11 DIAGNOSIS — F32A Depression, unspecified: Secondary | ICD-10-CM | POA: Diagnosis present

## 2021-12-11 DIAGNOSIS — Z825 Family history of asthma and other chronic lower respiratory diseases: Secondary | ICD-10-CM

## 2021-12-11 DIAGNOSIS — M7989 Other specified soft tissue disorders: Secondary | ICD-10-CM | POA: Diagnosis present

## 2021-12-11 DIAGNOSIS — Y712 Prosthetic and other implants, materials and accessory cardiovascular devices associated with adverse incidents: Secondary | ICD-10-CM | POA: Diagnosis present

## 2021-12-11 DIAGNOSIS — E1129 Type 2 diabetes mellitus with other diabetic kidney complication: Secondary | ICD-10-CM | POA: Diagnosis present

## 2021-12-11 DIAGNOSIS — I120 Hypertensive chronic kidney disease with stage 5 chronic kidney disease or end stage renal disease: Secondary | ICD-10-CM | POA: Diagnosis not present

## 2021-12-11 DIAGNOSIS — Z808 Family history of malignant neoplasm of other organs or systems: Secondary | ICD-10-CM

## 2021-12-11 DIAGNOSIS — Z8249 Family history of ischemic heart disease and other diseases of the circulatory system: Secondary | ICD-10-CM

## 2021-12-11 DIAGNOSIS — Z8042 Family history of malignant neoplasm of prostate: Secondary | ICD-10-CM | POA: Diagnosis not present

## 2021-12-11 LAB — COMPREHENSIVE METABOLIC PANEL WITH GFR
ALT: 17 U/L (ref 0–44)
AST: 20 U/L (ref 15–41)
Albumin: 2.8 g/dL — ABNORMAL LOW (ref 3.5–5.0)
Alkaline Phosphatase: 83 U/L (ref 38–126)
Anion gap: 10 (ref 5–15)
BUN: 38 mg/dL — ABNORMAL HIGH (ref 8–23)
CO2: 26 mmol/L (ref 22–32)
Calcium: 8.5 mg/dL — ABNORMAL LOW (ref 8.9–10.3)
Chloride: 103 mmol/L (ref 98–111)
Creatinine, Ser: 6.81 mg/dL — ABNORMAL HIGH (ref 0.44–1.00)
GFR, Estimated: 6 mL/min — ABNORMAL LOW
Glucose, Bld: 157 mg/dL — ABNORMAL HIGH (ref 70–99)
Potassium: 4.5 mmol/L (ref 3.5–5.1)
Sodium: 139 mmol/L (ref 135–145)
Total Bilirubin: 0.7 mg/dL (ref 0.3–1.2)
Total Protein: 5.9 g/dL — ABNORMAL LOW (ref 6.5–8.1)

## 2021-12-11 LAB — URINALYSIS, COMPLETE (UACMP) WITH MICROSCOPIC
Bilirubin Urine: NEGATIVE
Glucose, UA: NEGATIVE mg/dL
Hgb urine dipstick: NEGATIVE
Ketones, ur: NEGATIVE mg/dL
Nitrite: NEGATIVE
Protein, ur: 100 mg/dL — AB
Specific Gravity, Urine: 1.013 (ref 1.005–1.030)
WBC, UA: >50 WBC/hpf — ABNORMAL HIGH (ref 0–5)
pH: 8 (ref 5.0–8.0)

## 2021-12-11 LAB — CBC WITH DIFFERENTIAL/PLATELET
Abs Immature Granulocytes: 0.03 K/uL (ref 0.00–0.07)
Basophils Absolute: 0 K/uL (ref 0.0–0.1)
Basophils Relative: 1 %
Eosinophils Absolute: 0.1 K/uL (ref 0.0–0.5)
Eosinophils Relative: 2 %
HCT: 32.8 % — ABNORMAL LOW (ref 36.0–46.0)
Hemoglobin: 10 g/dL — ABNORMAL LOW (ref 12.0–15.0)
Immature Granulocytes: 1 %
Lymphocytes Relative: 26 %
Lymphs Abs: 1.7 K/uL (ref 0.7–4.0)
MCH: 29.9 pg (ref 26.0–34.0)
MCHC: 30.5 g/dL (ref 30.0–36.0)
MCV: 97.9 fL (ref 80.0–100.0)
Monocytes Absolute: 0.7 K/uL (ref 0.1–1.0)
Monocytes Relative: 11 %
Neutro Abs: 3.8 K/uL (ref 1.7–7.7)
Neutrophils Relative %: 59 %
Platelets: 219 K/uL (ref 150–400)
RBC: 3.35 MIL/uL — ABNORMAL LOW (ref 3.87–5.11)
RDW: 16.2 % — ABNORMAL HIGH (ref 11.5–15.5)
WBC: 6.4 K/uL (ref 4.0–10.5)
nRBC: 0 % (ref 0.0–0.2)

## 2021-12-11 LAB — APTT: aPTT: 48 s — ABNORMAL HIGH (ref 24–36)

## 2021-12-11 LAB — LACTIC ACID, PLASMA
Lactic Acid, Venous: 2.2 mmol/L (ref 0.5–1.9)
Lactic Acid, Venous: 0.9 mmol/L (ref 0.5–1.9)

## 2021-12-11 LAB — PROCALCITONIN: Procalcitonin: 0.14 ng/mL

## 2021-12-11 LAB — PROTIME-INR
INR: 1.1 (ref 0.8–1.2)
Prothrombin Time: 14.2 s (ref 11.4–15.2)

## 2021-12-11 LAB — SEDIMENTATION RATE: Sed Rate: 46 mm/hr — ABNORMAL HIGH (ref 0–30)

## 2021-12-11 MED ORDER — SENNOSIDES-DOCUSATE SODIUM 8.6-50 MG PO TABS
1.0000 | ORAL_TABLET | Freq: Two times a day (BID) | ORAL | Status: DC | PRN
  Administered 2021-12-13 – 2021-12-25 (×5): 1 via ORAL
  Filled 2021-12-11 (×5): qty 1

## 2021-12-11 MED ORDER — METRONIDAZOLE 500 MG/100ML IV SOLN
500.0000 mg | Freq: Once | INTRAVENOUS | Status: AC
  Administered 2021-12-11: 500 mg via INTRAVENOUS
  Filled 2021-12-11: qty 100

## 2021-12-11 MED ORDER — OXYCODONE-ACETAMINOPHEN 5-325 MG PO TABS
1.0000 | ORAL_TABLET | Freq: Three times a day (TID) | ORAL | Status: DC | PRN
  Administered 2021-12-11 – 2021-12-25 (×31): 1 via ORAL
  Filled 2021-12-11 (×33): qty 1

## 2021-12-11 MED ORDER — FUROSEMIDE 40 MG PO TABS
40.0000 mg | ORAL_TABLET | Freq: Every day | ORAL | Status: DC
  Administered 2021-12-13 – 2021-12-25 (×13): 40 mg via ORAL
  Filled 2021-12-11 (×13): qty 1

## 2021-12-11 MED ORDER — TIZANIDINE HCL 4 MG PO TABS
4.0000 mg | ORAL_TABLET | Freq: Two times a day (BID) | ORAL | Status: DC | PRN
  Administered 2021-12-11 – 2021-12-24 (×8): 4 mg via ORAL
  Filled 2021-12-11 (×9): qty 1

## 2021-12-11 MED ORDER — HYDROMORPHONE HCL 1 MG/ML IJ SOLN
0.5000 mg | INTRAMUSCULAR | Status: AC
  Administered 2021-12-11: 0.5 mg via INTRAVENOUS
  Filled 2021-12-11: qty 0.5

## 2021-12-11 MED ORDER — HYDROMORPHONE HCL 1 MG/ML IJ SOLN
0.5000 mg | INTRAMUSCULAR | Status: DC | PRN
  Administered 2021-12-12 (×2): 0.5 mg via INTRAVENOUS
  Filled 2021-12-11 (×2): qty 1

## 2021-12-11 MED ORDER — CITALOPRAM HYDROBROMIDE 10 MG PO TABS
10.0000 mg | ORAL_TABLET | Freq: Every day | ORAL | Status: DC
  Administered 2021-12-13 – 2021-12-25 (×13): 10 mg via ORAL
  Filled 2021-12-11 (×13): qty 1

## 2021-12-11 MED ORDER — TOPIRAMATE 25 MG PO TABS
50.0000 mg | ORAL_TABLET | Freq: Two times a day (BID) | ORAL | Status: DC | PRN
  Administered 2021-12-16: 50 mg via ORAL
  Filled 2021-12-11 (×2): qty 2

## 2021-12-11 MED ORDER — ACETAMINOPHEN 325 MG PO TABS
650.0000 mg | ORAL_TABLET | Freq: Four times a day (QID) | ORAL | Status: DC | PRN
  Administered 2021-12-11 – 2021-12-24 (×7): 650 mg via ORAL
  Filled 2021-12-11 (×7): qty 2

## 2021-12-11 MED ORDER — TRAZODONE HCL 50 MG PO TABS
50.0000 mg | ORAL_TABLET | Freq: Every day | ORAL | Status: DC
Start: 2021-12-11 — End: 2021-12-25
  Administered 2021-12-11 – 2021-12-24 (×14): 50 mg via ORAL
  Filled 2021-12-11 (×14): qty 1

## 2021-12-11 MED ORDER — ONDANSETRON HCL 4 MG/2ML IJ SOLN
4.0000 mg | INTRAMUSCULAR | Status: AC
  Administered 2021-12-11: 4 mg via INTRAVENOUS
  Filled 2021-12-11: qty 2

## 2021-12-11 MED ORDER — CHLORHEXIDINE GLUCONATE CLOTH 2 % EX PADS
6.0000 | MEDICATED_PAD | Freq: Every day | CUTANEOUS | Status: DC
  Administered 2021-12-12 – 2021-12-25 (×13): 6 via TOPICAL
  Filled 2021-12-11: qty 6

## 2021-12-11 MED ORDER — VANCOMYCIN HCL 1500 MG/300ML IV SOLN
1500.0000 mg | Freq: Once | INTRAVENOUS | Status: AC
  Administered 2021-12-11: 1500 mg via INTRAVENOUS
  Filled 2021-12-11: qty 300

## 2021-12-11 MED ORDER — VANCOMYCIN HCL 750 MG/150ML IV SOLN
750.0000 mg | INTRAVENOUS | Status: DC
  Administered 2021-12-12: 750 mg via INTRAVENOUS
  Filled 2021-12-11: qty 150

## 2021-12-11 MED ORDER — CEFEPIME HCL 2 G IV SOLR
2.0000 g | Freq: Once | INTRAVENOUS | Status: AC
  Administered 2021-12-11: 2 g via INTRAVENOUS
  Filled 2021-12-11: qty 12.5

## 2021-12-11 MED ORDER — CARVEDILOL 3.125 MG PO TABS
6.2500 mg | ORAL_TABLET | Freq: Every evening | ORAL | Status: DC
  Administered 2021-12-11 – 2021-12-24 (×13): 6.25 mg via ORAL
  Filled 2021-12-11 (×14): qty 2

## 2021-12-11 MED ORDER — PANTOPRAZOLE SODIUM 40 MG PO TBEC
40.0000 mg | DELAYED_RELEASE_TABLET | Freq: Two times a day (BID) | ORAL | Status: DC
  Administered 2021-12-11 – 2021-12-25 (×26): 40 mg via ORAL
  Filled 2021-12-11 (×26): qty 1

## 2021-12-11 MED ORDER — HEPARIN SODIUM (PORCINE) 5000 UNIT/ML IJ SOLN
5000.0000 [IU] | Freq: Three times a day (TID) | INTRAMUSCULAR | Status: DC
Start: 2021-12-11 — End: 2021-12-25
  Administered 2021-12-11 – 2021-12-25 (×38): 5000 [IU] via SUBCUTANEOUS
  Filled 2021-12-11 (×39): qty 1

## 2021-12-11 MED ORDER — METRONIDAZOLE 500 MG/100ML IV SOLN
500.0000 mg | Freq: Two times a day (BID) | INTRAVENOUS | Status: DC
  Administered 2021-12-11 – 2021-12-13 (×5): 500 mg via INTRAVENOUS
  Filled 2021-12-11 (×6): qty 100

## 2021-12-11 MED ORDER — HYDRALAZINE HCL 20 MG/ML IJ SOLN
5.0000 mg | INTRAMUSCULAR | Status: DC | PRN
Start: 2021-12-11 — End: 2021-12-25
  Administered 2021-12-15: 5 mg via INTRAVENOUS
  Filled 2021-12-11: qty 1

## 2021-12-11 MED ORDER — SODIUM CHLORIDE 0.9 % IV SOLN: 1.0000 g | INTRAVENOUS | Status: DC

## 2021-12-11 MED ORDER — ONDANSETRON HCL 4 MG/2ML IJ SOLN
4.0000 mg | Freq: Three times a day (TID) | INTRAMUSCULAR | Status: DC | PRN
  Administered 2021-12-12 – 2021-12-22 (×6): 4 mg via INTRAVENOUS
  Filled 2021-12-11 (×3): qty 2

## 2021-12-11 MED ORDER — SODIUM CHLORIDE 0.9 % IV SOLN
1.0000 g | INTRAVENOUS | Status: DC
  Filled 2021-12-11 (×2): qty 10

## 2021-12-11 NOTE — Progress Notes (Signed)
[facility]  ROUNDING NOTE   Subjective:   Katie [name] is a 66 year old female with past medical history of ESRD with hemodialysis TTS, diabetes mellitus, and hypertension. She presents to ED with worsening left lower arm swelling and redness. Patient has been admitted for Cellulitis of left upper extremity [L03.114]  Patient is known to our practice and receives outpatient dialysis treatments at [facility] on a TTS schedule, supervised by Dr. Holley Raring.  Patient missed treatment on Saturday due to transportation issues.  Patient is seen resting on stretcher, eating lunch.  Patient states she has been doing well at her new assisted living facility.  She states her left arm was mildly swollen on discharge.  States her arm has progressively gotten worse over the past week.  Reported redness 2 to 3 days ago.  Patient does have fistula in the left upper arm, not in use at this time.  Patient did sustain dog bite earlier this year on her left ring finger.  She has been admitted since that time to receive IV antibiotics.  Denies nausea, vomiting, diarrhea.    Labs currently unremarkable for renal patient however lactic acid 2.2 and hemoglobin 10.0.  Chest x-ray shows no acute findings.  Left upper extremity ultrasound negative for DVT.  We have been consulted to manage dialysis needs during this admission   Objective:  Vital signs in last 24 hours:  Temp:  [97.8 F (36.6 C)] 97.8 F (36.6 C) (07/31 1012) Pulse Rate:  [64] 64 (07/31 1012) Resp:  [20] 20 (07/31 1012) BP: (146)/(80) 146/80 (07/31 1012) SpO2:  [99 %-100 %] 100 % (07/31 1012) Weight:  [78.1 kg] 78.1 kg (07/31 1014)  Weight change:  Filed Weights   12/11/21 1014  Weight: 78.1 kg    Intake/Output: No intake/output data recorded.   Intake/Output this shift:  No intake/output data recorded.  Physical Exam: General: NAD  Head: Normocephalic, atraumatic. Moist oral mucosal membranes  Eyes: Anicteric  Lungs:   Clear to auscultation, normal effort, room air  Heart: Regular rate and rhythm  Abdomen:  Soft, nontender  Extremities: 1+ peripheral edema.  Neurologic: Nonfocal, moving all four extremities  Skin: No lesions  Access: Right chest PermCath    Basic Metabolic Panel: Recent Labs  Lab 12/11/21 1021  NA 139  K 4.5  CL 103  CO2 26  GLUCOSE 157*  BUN 38*  CREATININE 6.81*  CALCIUM 8.5*    Liver Function Tests: Recent Labs  Lab 12/11/21 1021  AST 20  ALT 17  ALKPHOS 83  BILITOT 0.7  PROT 5.9*  ALBUMIN 2.8*   No results for input(s): "LIPASE", "AMYLASE" in the last 168 hours. No results for input(s): "AMMONIA" in the last 168 hours.  CBC: Recent Labs  Lab 12/11/21 1021  WBC 6.4  NEUTROABS 3.8  HGB 10.0*  HCT 32.8*  MCV 97.9  PLT 219    Cardiac Enzymes: No results for input(s): "CKTOTAL", "CKMB", "CKMBINDEX", "TROPONINI" in the last 168 hours.  BNP: Invalid input(s): "POCBNP"  CBG: No results for input(s): "GLUCAP" in the last 168 hours.  Microbiology: Results for orders placed or performed during the hospital encounter of 12/11/21  Blood Culture (routine x 2)     Status: None (Preliminary result)   Collection Time: 12/11/21 10:21 AM   Specimen: BLOOD  Result Value Ref Range Status   Specimen Description BLOOD LEFT [facility]  Final   Special Requests   Final    BOTTLES DRAWN AEROBIC AND ANAEROBIC Blood Culture  adequate volume   Culture   Final    NO GROWTH < 12 HOURS Performed at [facility], [address] [place], [place] [address]    Report Status PENDING  Incomplete  Blood Culture (routine x 2)     Status: None (Preliminary result)   Collection Time: 12/11/21 10:21 AM   Specimen: BLOOD  Result Value Ref Range Status   Specimen Description BLOOD RIGHT ARM  Final   Special Requests   Final    BOTTLES DRAWN AEROBIC AND ANAEROBIC Blood Culture results may not be optimal due to an excessive volume of blood received in culture bottles    Culture   Final    NO GROWTH < 12 HOURS Performed at [facility], [address], [place], [place] [address]    Report Status PENDING  Incomplete    Coagulation Studies: Recent Labs    12/11/21 1021  LABPROT 14.2  INR 1.1    Urinalysis: No results for input(s): "COLORURINE", "LABSPEC", "PHURINE", "GLUCOSEU", "HGBUR", "BILIRUBINUR", "KETONESUR", "PROTEINUR", "UROBILINOGEN", "NITRITE", "LEUKOCYTESUR" in the last 72 hours.  Invalid input(s): "APPERANCEUR"    Imaging: US Venous Img Upper Uni Left  Result Date: 12/11/2021 CLINICAL DATA:  Pain and swelling in LEFT arm, dog by 2 weeks ago, has fistula in same arm EXAM: LEFT UPPER EXTREMITY VENOUS DOPPLER ULTRASOUND TECHNIQUE: Gray-scale sonography with graded compression, as well as color Doppler and duplex ultrasound were performed to evaluate the upper extremity deep venous system from the level of the subclavian vein and including the jugular, axillary, basilic, radial, ulnar and upper cephalic vein. Spectral Doppler was utilized to evaluate flow at rest and with distal augmentation maneuvers. COMPARISON:  11/19/2021 FINDINGS: Contralateral Subclavian Vein: Respiratory phasicity is normal and symmetric with the symptomatic side. No evidence of thrombus. Normal compressibility. Internal Jugular Vein: No evidence of thrombus. Normal compressibility, respiratory phasicity and response to augmentation. Subclavian Vein: No evidence of thrombus. Normal compressibility, respiratory phasicity and response to augmentation. Axillary Vein: No evidence of thrombus. Normal compressibility, respiratory phasicity and response to augmentation. Cephalic Vein: No evidence of thrombus. Normal compressibility, respiratory phasicity and response to augmentation. Basilic Vein: No evidence of thrombus. Normal compressibility, respiratory phasicity and response to augmentation. Brachial Veins: No evidence of thrombus. Normal compressibility, respiratory  phasicity and response to augmentation. Radial Veins: No evidence of thrombus. Normal compressibility, respiratory phasicity and response to augmentation. Ulnar Veins: No evidence of thrombus. Normal compressibility, respiratory phasicity and response to augmentation. Venous Reflux:  None visualized. Other Findings: Dialysis AV fistula present, patent. Scattered soft tissue swelling LEFT forearm. IMPRESSION: No evidence of DVT within the LEFT upper extremity. Patent dialysis fistula. Electronically Signed   By: Lavonia Dana M.D.   On: 12/11/2021 11:29   DG Chest Port 1 View  Result Date: 12/11/2021 CLINICAL DATA:  Questionable sepsis. EXAM: PORTABLE CHEST 1 VIEW COMPARISON:  November 23, 2021 FINDINGS: Patient is rotated. Right chest dual lumen catheter with tips projecting over the superior cavoatrial junction/right atrium. Enlarged cardiac silhouette. Aortic atherosclerosis. Linear band of scarring versus atelectasis in the left lung base. Bibasilar atelectasis. No new focal airspace consolidation. No visible pleural effusion or pneumothorax. No acute osseous abnormality. IMPRESSION: Similar bibasilar atelectasis. No new focal airspace consolidation. Electronically Signed   By: Dahlia Bailiff M.D.   On: 12/11/2021 10:58     Medications:    ceFEPime (MAXIPIME) IV     vancomycin      Chlorhexidine Gluconate Cloth  6 each Topical Q0600  heparin  5,000 Units Subcutaneous Q8H   acetaminophen, hydrALAZINE, ondansetron (ZOFRAN) IV  Assessment/ Plan:  Ms. Gabryel Talamo is a 66 y.o.  female   with past medical history of ESRD with hemodialysis TTS, diabetes mellitus, and hypertension. She presents to ED with worsening left lower arm swelling and redness. Patient has been admitted for Cellulitis of left upper extremity [V78.469]   [facility]/TTS/right PermCath/83.5 kg  End-stage renal disease on hemodialysis.  Will maintain outpatient schedule if possible.  Patient states missing treatment on  Saturday however labs stable.  We will plan to dialyze patient tomorrow to maintain outpatient schedule.  2. Anemia of chronic kidney disease Lab Results  Component Value Date   HGB 10.0 (L) 12/11/2021    Hemoglobin within acceptable range.  We will continue to monitor.  Patient receives Gary outpatient.  3. Secondary Hyperparathyroidism:  Lab Results  Component Value Date   PTH 70 (H) 05/12/2020   CALCIUM 8.5 (L) 12/11/2021   PHOS 5.4 (H) 11/22/2021    Calcium remains within desired target.  4.  Hypertension with chronic kidney disease.  Home regimen includes furosemide 40 mg daily.  Currently held at this time.  5.  Suspected left arm cellulitis evidenced by edema, erythema, warm to touch.  Likely secondary to wound infection.  Evaluation ongoing.  Currently prescribed cefepime and metronidazole.  Received vancomycin 1.5 g.   LOS: 0 Ambera Fedele 7/31/20231:55 PM

## 2021-12-11 NOTE — Assessment & Plan Note (Addendum)
Stable.  Echo on 03/07/2021 showed EF of 60 to 65%.   --Volume management by dialysis

## 2021-12-11 NOTE — Plan of Care (Signed)

## 2021-12-11 NOTE — Progress Notes (Signed)
PHARMACY -  BRIEF ANTIBIOTIC NOTE   Pharmacy has received consult(s) for Cefepime and Vancomycin from an ED provider.  The patient's profile has been reviewed for ht/wt/allergies/indication/available labs. Patient is ESRD on dialysis.  One time order(s) placed for Cefepime 2g and Vancomycin 1500mg   Further antibiotics/pharmacy consults should be ordered by admitting physician if indicated.                       Thank you, Vira Blanco 12/11/2021  11:19 AM

## 2021-12-11 NOTE — Assessment & Plan Note (Addendum)
Continue Celexa

## 2021-12-11 NOTE — ED Provider Notes (Signed)
Patient fully alert and oriented.  Patient notes ongoing pain in the left forearm, would like some additional pain medication.  Hydromorphone very helpful earlier.  I have ordered additional hydromorphone at this time.  Nursing to contact hospitalist team to discuss future potential for as needed pain medication beyond Tylenol   Delman Kitten, MD 12/11/21 [phone]

## 2021-12-11 NOTE — Assessment & Plan Note (Addendum)
BP's overall stable. --Continue Coreg and Lasix

## 2021-12-11 NOTE — Assessment & Plan Note (Addendum)
Cellulitis ruled out after no response to IV antibiotics.  She had severe lymphedema and inflammation due to stenosis near her fistula.  Symptoms resolved after angioplasty. Treated with few days IV antibiotics post-procedure --Continue pain control PRN --AV fistula used for dialysis Sat 8/12 --Monitor the left arm

## 2021-12-11 NOTE — Assessment & Plan Note (Addendum)
-   nephrology consulted for dialysis

## 2021-12-11 NOTE — ED Notes (Signed)
Dr Jacqualine Code made aware at this time of elevated lactic acid level 2.2

## 2021-12-11 NOTE — Assessment & Plan Note (Addendum)
Hemoglobin stable 9.2 --Repeat CBC at follow up

## 2021-12-11 NOTE — Progress Notes (Signed)
Pharmacy Antibiotic Note  Katie [name] is a 66 y.o. female admitted on 12/11/2021 with  wound infection .  Pharmacy has been consulted for Vancomycin dosing. Patient is ESRD on dialysis every Tue/Thur/Sat schedule.  Plan: Vancomycin 1500mg  IV loading dose followed by maintenance dose of 750mg  IV at the end of each dialysis session (TTS)  Will check trough level prior to 3rd dose.  Height: 5\' 6"  (167.6 cm) Weight: 78.1 kg (172 lb 3.2 oz) IBW/kg (Calculated) : 59.3  Temp (24hrs), Avg:97.8 F (36.6 C), Min:97.8 F (36.6 C), Max:97.8 F (36.6 C)  Recent Labs  Lab 12/11/21 1021  WBC 6.4  CREATININE 6.81*  LATICACIDVEN 2.2*    Estimated Creatinine Clearance: 8.9 mL/min (A) (by C-G formula based on SCr of 6.81 mg/dL (H)).    Allergies  Allergen Reactions   Penicillins Nausea And Vomiting    Pt states "only pills" make her vomit Other reaction(s): Nausea/Vomit    Antimicrobials this admission: Vancomycin 7/30 >>  Ceftriaxone 7/30 >>  Metronidazole 7/30 >>  Dose adjustments this admission:  Microbiology results:  Thank you for allowing pharmacy to be a part of this patient's care.  Paulina Fusi, PharmD, BCPS 12/11/2021 2:33 PM

## 2021-12-11 NOTE — Assessment & Plan Note (Addendum)
Lactic acid 2.2 on admission, resolved.

## 2021-12-11 NOTE — ED Triage Notes (Signed)
Pt was treated for dog bite infection on left forearm. Completed 2 week doxycycline treatment.Swelling had improved but returns today with worsening swelling, pain, and redness on forearm. Injury occurred to dialysis arm.  Patient also hasnt had dialysis since 7/20 when a partial dialysis was completed.

## 2021-12-11 NOTE — H&P (Addendum)
History and Physical    Katie [name] YDX:[id number] DOB: [birth year] DOA: 12/11/2021  Referring MD/NP/PA:   PCP: Pcp, No   Patient coming from:  The patient is coming from ALF  Chief Complaint: left forearm pain and swelling  HPI: Katie [name] is a 66 y.o. female with medical history significant of ESRD-HD (TTS), hypertension, diet-controlled diabetes mellitus, depression with anxiety, dCHF, anemia, who presents with left forearm pain and swelling.  Pt states that she had dog bite about one month ago. Pt was treated for dog bite infection on left forearm. She completed 2 week doxycycline treatment. She states that her left forearm of pain has been progressively worsening the past several days.  She also has swelling, erythema in left forearm.  Denies fever or chills.  States that the pain is constant, sharp, severe, 10 out of 10 in severity, nonradiating.  Patient does not have chest pain, cough, shortness breath. She has nausea, no vomiting, diarrhea or abdominal pain.  No symptoms of UTI.  Patient also hasn't had dialysis since 7/20 when a partial dialysis was completed. She has an old left arm fistula, but currently receives dialysis through a right subclavian.  Data reviewed independently and ED Course: pt was found to have WBC 6.4, lactic acid 2.2, INR 1.1, PTT 48, potassium 4.5, bicarbonate 26, creatinine 6.81, BUN 38, temperature normal, blood pressure 146/80, heart rate 64, RR 20, oxygen saturation 100% on room air.  Chest x-ray showed bilateral basilar atelectasis.  Left forearm venous Dopplers negative for DVT.  Patient is admitted to telemetry bed as inpatient.  Dr. Candiss Norse of nephrology is consulted for dialysis.  Left UE venous doppler: No evidence of DVT within the LEFT upper extremity. Patent dialysis fistula.   EKG: Not done in ED, will get one.     Review of Systems:   General: no fevers, chills, no body weight gain, has fatigue HEENT: no blurry vision, hearing changes or  sore throat Respiratory: no dyspnea, coughing, wheezing CV: no chest pain, no palpitations GI: no nausea, vomiting, abdominal pain, diarrhea, constipation GU: no dysuria, burning on urination, increased urinary frequency, hematuria  Ext: Has left forearm with pain and swelling Neuro: no unilateral weakness, numbness, or tingling, no vision change or hearing loss Skin: no rash, no skin tear. MSK: No muscle spasm, no deformity, no limitation of range of movement in spin Heme: No easy bruising.  Travel history: No recent long distant travel.   Allergy:  Allergies  Allergen Reactions   Penicillins Nausea And Vomiting    Pt states "only pills" make her vomit Other reaction(s): Nausea/Vomit    Past Medical History:  Diagnosis Date   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[place])    Hypertension    Renal disorder    Type 2 diabetes mellitus ([place])     Past Surgical History:  Procedure Laterality Date   APPENDECTOMY     AV FISTULA PLACEMENT Left 03/16/2021   Procedure: INSERTION OF ARTERIOVENOUS (AV) GORE-TEX GRAFT ARM;  Surgeon: Algernon Huxley, MD;  Location: [facility];  Service: Vascular;  Laterality: Left;   DIALYSIS/PERMA CATHETER INSERTION N/A 05/16/2020   Procedure: DIALYSIS/PERMA CATHETER INSERTION;  Surgeon: Algernon Huxley, MD;  Location: [facility];  Service: Cardiovascular;  Laterality: N/A;   MANDIBLE SURGERY     RENAL BIOPSY Right    RIGHT HEART CATH N/A 05/10/2020   Procedure: RIGHT HEART CATH;  Surgeon: Nelva Bush, MD;  Location: [facility];  Service: Cardiovascular;  Laterality: N/A;   TEMPORARY DIALYSIS CATHETER N/A 05/11/2020   Procedure: TEMPORARY DIALYSIS CATHETER;  Surgeon: Algernon Huxley, MD;  Location: [facility];  Service: Cardiovascular;  Laterality: N/A;    Social History:  reports that she has quit smoking. Her smoking use included cigarettes. She has never used smokeless tobacco. She reports that she does not currently use  alcohol. She reports that she does not use drugs.  Family History:  Family History  Problem Relation Age of Onset   Heart attack Mother    Asthma Mother    Uterine cancer Mother    Skin cancer Father    Prostate cancer Father    CAD Father    Parkinson's disease Father    Lung cancer Brother      Prior to Admission medications   Medication Sig Start Date End Date Taking? Authorizing Provider  acetaminophen (TYLENOL) 500 MG tablet Take 500 mg by mouth every 6 (six) hours as needed for moderate pain.    [provider]  carvedilol (COREG) 6.25 MG tablet Take 1 tablet (6.25 mg total) by mouth every evening. 11/26/21 12/26/21  Vanna Scotland, MD  citalopram (CELEXA) 10 MG tablet Take 1 tablet (10 mg total) by mouth daily. 11/23/21 12/23/21  Barb Merino, MD  furosemide (LASIX) 40 MG tablet Take 1 tablet (40 mg total) by mouth daily. 11/23/21 12/23/21  Barb Merino, MD  ondansetron (ZOFRAN-ODT) 4 MG disintegrating tablet Take 1 tablet (4 mg total) by mouth every 8 (eight) hours as needed for nausea or vomiting. 11/22/21   Barb Merino, MD  oxyCODONE-acetaminophen (PERCOCET/ROXICET) 5-325 MG tablet Take 1 tablet by mouth every 8 (eight) hours as needed for moderate pain or severe pain. 11/22/21   Barb Merino, MD  pantoprazole (PROTONIX) 40 MG tablet Take 1 tablet (40 mg total) by mouth 2 (two) times daily. 11/22/21 12/22/21  Barb Merino, MD  senna-docusate (SENOKOT-S) 8.6-50 MG tablet Take 1 tablet by mouth 2 (two) times daily. 03/21/21   Sidney Ace, MD  tiZANidine (ZANAFLEX) 4 MG tablet Take 4 mg by mouth 2 (two) times daily as needed. 06/19/21   [provider]  topiramate (TOPAMAX) 50 MG tablet Take 1 tablet (50 mg total) by mouth 2 (two) times daily as needed (headache). 03/21/21   Sidney Ace, MD  traZODone (DESYREL) 50 MG tablet Take 1 tablet (50 mg total) by mouth every evening. 07/02/20 08/01/20  Nolberto Hanlon, MD    Physical Exam: Vitals:   12/11/21 1011  12/11/21 1012 12/11/21 1014  BP:  (!) 146/80   Pulse:  64   Resp:  20   Temp:  97.8 F (36.6 C)   TempSrc:  Oral   SpO2: 99% 100%   Weight:   78.1 kg  Height:   5\' 6"  (1.676 m)   General: Not in acute distress HEENT:       Eyes: PERRL, EOMI, no scleral icterus.       ENT: No discharge from the ears and nose, no pharynx injection, no tonsillar enlargement.        Neck: No JVD, no bruit, no mass felt. Heme: No neck lymph node enlargement. Cardiac: S1/S2, RRR, No murmurs, No gallops or rubs. Respiratory: No rales, wheezing, rhonchi or rubs. GI: Soft, nondistended, nontender, no rebound pain, no organomegaly, BS present. GU: No hematuria Ext: Has swelling, erythema, tenderness, warmth in left forearm     Musculoskeletal: No joint deformities, No joint redness or warmth, no  limitation of ROM in spin. Skin: No rashes.  Neuro: Alert, oriented X3, cranial nerves II-XII grossly intact, moves all extremities normally.  Psych: Patient is not psychotic, no suicidal or hemocidal ideation.  Labs on Admission: I have personally reviewed following labs and imaging studies  CBC: Recent Labs  Lab 12/11/21 1021  WBC 6.4  NEUTROABS 3.8  HGB 10.0*  HCT 32.8*  MCV 97.9  PLT 245   Basic Metabolic Panel: Recent Labs  Lab 12/11/21 1021  NA 139  K 4.5  CL 103  CO2 26  GLUCOSE 157*  BUN 38*  CREATININE 6.81*  CALCIUM 8.5*   GFR: Estimated Creatinine Clearance: 8.9 mL/min (A) (by C-G formula based on SCr of 6.81 mg/dL (H)). Liver Function Tests: Recent Labs  Lab 12/11/21 1021  AST 20  ALT 17  ALKPHOS 83  BILITOT 0.7  PROT 5.9*  ALBUMIN 2.8*   No results for input(s): "LIPASE", "AMYLASE" in the last 168 hours. No results for input(s): "AMMONIA" in the last 168 hours. Coagulation Profile: Recent Labs  Lab 12/11/21 1021  INR 1.1   Cardiac Enzymes: No results for input(s): "CKTOTAL", "CKMB", "CKMBINDEX", "TROPONINI" in the last 168 hours. BNP (last 3 results) No  results for input(s): "PROBNP" in the last 8760 hours. HbA1C: No results for input(s): "HGBA1C" in the last 72 hours. CBG: No results for input(s): "GLUCAP" in the last 168 hours. Lipid Profile: No results for input(s): "CHOL", "HDL", "LDLCALC", "TRIG", "CHOLHDL", "LDLDIRECT" in the last 72 hours. Thyroid Function Tests: No results for input(s): "TSH", "T4TOTAL", "FREET4", "T3FREE", "THYROIDAB" in the last 72 hours. Anemia Panel: No results for input(s): "VITAMINB12", "FOLATE", "FERRITIN", "TIBC", "IRON", "RETICCTPCT" in the last 72 hours. Urine analysis:    Component Value Date/Time   COLORURINE YELLOW (A) 09/23/2021 2143   APPEARANCEUR HAZY (A) 09/23/2021 2143   LABSPEC 1.012 09/23/2021 2143   PHURINE 9.0 (H) 09/23/2021 2143   GLUCOSEU 150 (A) 09/23/2021 2143   HGBUR NEGATIVE 09/23/2021 2143   BILIRUBINUR NEGATIVE 09/23/2021 2143   KETONESUR 5 (A) 09/23/2021 2143   PROTEINUR 100 (A) 09/23/2021 2143   NITRITE NEGATIVE 09/23/2021 2143   LEUKOCYTESUR LARGE (A) 09/23/2021 2143   Sepsis Labs: @LABRCNTIP (procalcitonin:4,lacticidven:4) ) Recent Results (from the past 240 hour(s))  Blood Culture (routine x 2)     Status: None (Preliminary result)   Collection Time: 12/11/21 10:21 AM   Specimen: BLOOD  Result Value Ref Range Status   Specimen Description BLOOD LEFT AC  Final   Special Requests   Final    BOTTLES DRAWN AEROBIC AND ANAEROBIC Blood Culture adequate volume   Culture   Final    NO GROWTH < 12 HOURS Performed at [facility], [address], [place], [place] [address]    Report Status PENDING  Incomplete  Blood Culture (routine x 2)     Status: None (Preliminary result)   Collection Time: 12/11/21 10:21 AM   Specimen: BLOOD  Result Value Ref Range Status   Specimen Description BLOOD RIGHT ARM  Final   Special Requests   Final    BOTTLES DRAWN AEROBIC AND ANAEROBIC Blood Culture results may not be optimal due to an excessive volume of blood received in  culture bottles   Culture   Final    NO GROWTH < 12 HOURS Performed at [facility], [address] [place], [place] [address]    Report Status PENDING  Incomplete     Radiological Exams on Admission: US Venous Img Upper Uni  Left  Result Date: 12/11/2021 CLINICAL DATA:  Pain and swelling in LEFT arm, dog by 2 weeks ago, has fistula in same arm EXAM: LEFT UPPER EXTREMITY VENOUS DOPPLER ULTRASOUND TECHNIQUE: Gray-scale sonography with graded compression, as well as color Doppler and duplex ultrasound were performed to evaluate the upper extremity deep venous system from the level of the subclavian vein and including the jugular, axillary, basilic, radial, ulnar and upper cephalic vein. Spectral Doppler was utilized to evaluate flow at rest and with distal augmentation maneuvers. COMPARISON:  11/19/2021 FINDINGS: Contralateral Subclavian Vein: Respiratory phasicity is normal and symmetric with the symptomatic side. No evidence of thrombus. Normal compressibility. Internal Jugular Vein: No evidence of thrombus. Normal compressibility, respiratory phasicity and response to augmentation. Subclavian Vein: No evidence of thrombus. Normal compressibility, respiratory phasicity and response to augmentation. Axillary Vein: No evidence of thrombus. Normal compressibility, respiratory phasicity and response to augmentation. Cephalic Vein: No evidence of thrombus. Normal compressibility, respiratory phasicity and response to augmentation. Basilic Vein: No evidence of thrombus. Normal compressibility, respiratory phasicity and response to augmentation. Brachial Veins: No evidence of thrombus. Normal compressibility, respiratory phasicity and response to augmentation. Radial Veins: No evidence of thrombus. Normal compressibility, respiratory phasicity and response to augmentation. Ulnar Veins: No evidence of thrombus. Normal compressibility, respiratory phasicity and response to augmentation. Venous Reflux:   None visualized. Other Findings: Dialysis AV fistula present, patent. Scattered soft tissue swelling LEFT forearm. IMPRESSION: No evidence of DVT within the LEFT upper extremity. Patent dialysis fistula. Electronically Signed   By: Lavonia Dana M.D.   On: 12/11/2021 11:29   DG Chest Port 1 View  Result Date: 12/11/2021 CLINICAL DATA:  Questionable sepsis. EXAM: PORTABLE CHEST 1 VIEW COMPARISON:  November 23, 2021 FINDINGS: Patient is rotated. Right chest dual lumen catheter with tips projecting over the superior cavoatrial junction/right atrium. Enlarged cardiac silhouette. Aortic atherosclerosis. Linear band of scarring versus atelectasis in the left lung base. Bibasilar atelectasis. No new focal airspace consolidation. No visible pleural effusion or pneumothorax. No acute osseous abnormality. IMPRESSION: Similar bibasilar atelectasis. No new focal airspace consolidation. Electronically Signed   By: Dahlia Bailiff M.D.   On: 12/11/2021 10:58      Assessment/Plan Principal Problem:   Cellulitis of left upper extremity Active Problems:   ESRD (end stage renal disease) ([place])   Hypertension   Anemia in ESRD (end-stage renal disease) ([facility])   Elevated lactic acid level   Chronic diastolic CHF (congestive heart failure) ([facility])   Depression, unspecified   Type II diabetes mellitus with renal manifestations ([facility])   Assessment and Plan: * Cellulitis of left upper extremity Patient does not meets criteria for sepsis.  No fever or leukocytosis.  -Admit to telemetry bed as inpatient -Vancomycin, Rocephin, Flagyl (patient received 1 dose of Rocephin in the ED) -Follow-up of blood culture -As needed Tylenol,  -Continue home as needed Percocet and Dilaudid for severe pain  ESRD (end stage renal disease) ([place]) - Consulted renal for dialysis  Hypertension - IV hydralazine as needed -Coreg -Patient is also on Lasix  Anemia in ESRD (end-stage renal disease) ([facility]) Hemoglobin stable 10.0. -Follow-up  with CBC  Elevated lactic acid level Lactic acid 2.2.  No sepsis on admission -Will not give IV fluid due to ESRD -Trend lactic acid level  Chronic diastolic CHF (congestive heart failure) ([place]) 2D echo on 03/07/2021 showed EF of 60 to 65%.  Patient does not have shortness of breath.  No signs of CHF exacerbation -Volume management per renal by dialysis  Depression, unspecified - Continue home medications: Celexa          DVT ppx: SQ Heparin   Code Status: Full code  Family Communication: not done, no family member is at bed side.   Disposition Plan:  Anticipate discharge back to previous environment, ALF  Consults called: Dr. Candiss Norse of renal  Admission status and Level of care: Telemetry Medical:   as inpt          Severity of Illness:  The appropriate patient status for this patient is INPATIENT. Inpatient status is judged to be reasonable and necessary in order to provide the required intensity of service to ensure the patient's safety. The patient's presenting symptoms, physical exam findings, and initial radiographic and laboratory data in the context of their chronic comorbidities is felt to place them at high risk for further clinical deterioration. Furthermore, it is not anticipated that the patient will be medically stable for discharge from the hospital within 2 midnights of admission.   * I certify that at the point of admission it is my clinical judgment that the patient will require inpatient hospital care spanning beyond 2 midnights from the point of admission due to high intensity of service, high risk for further deterioration and high frequency of surveillance required.*       Date of Service 12/11/2021    Ivor Costa [facility]   If 7PM-7AM, please contact night-coverage [email or web address] 12/11/2021, 4:13 PM

## 2021-12-11 NOTE — ED Provider Notes (Signed)
[facility] Provider Note    Event Date/Time   First MD Initiated Contact with Patient 12/11/21 1027     (approximate)   History   Extremity Pain   HPI  Anu Stagner is a 66 y.o. female  ESRD on hemodialysis, type 2 diabetes, homelessness admitted on 10/23/2021 with cough and shortness of breath and fever and chest x-ray consistent with pneumonia.  Patient was recently treated for a left forearm infection, she reports that she was on antibiotic and has continued on antibiotic, not sure of the name.  However her left forearm has once again become red swollen warm and painful.  The swelling had gone down but the last 2 days the swelling has returned.  There is been no injury.  This initially started after a dog bite over a month ago.  Additionally she has been having difficulty getting to dialysis on the weekends and missed her Saturday appointment but was there Thursday  No fevers or chills.  Reports a fairly severe pain in her left forearm only.  Some swelling to the hand as well.  No numbness, no weakness in the hand  Has an old left arm fistula, but currently receives dialysis through a right subclavian cath      Physical Exam   Triage Vital Signs: ED Triage Vitals  Enc Vitals Group     BP 12/11/21 1012 (!) 146/80     Pulse Rate 12/11/21 1012 64     Resp 12/11/21 1012 20     Temp 12/11/21 1012 97.8 F (36.6 C)     Temp Source 12/11/21 1012 Oral     SpO2 12/11/21 1011 99 %     Weight 12/11/21 1014 172 lb 3.2 oz (78.1 kg)     Height 12/11/21 1014 5\' 6"  (1.676 m)     Head Circumference --      Peak Flow --      Pain Score 12/11/21 1013 6     Pain Loc --      Pain Edu? --      Excl. in [place]? --     Most recent vital signs: Vitals:   12/11/21 1011 12/11/21 1012  BP:  (!) 146/80  Pulse:  64  Resp:  20  Temp:  97.8 F (36.6 C)  SpO2: 99% 100%     General: Awake, no distress.  She does appear in pain reporting notable pain in her left  upper extremity CV:  Good peripheral perfusion.  Normal heart tone Resp:  Normal effort.  Clear bilaterally, noted to have an occasional cough though.  She denies dyspnea Abd:  No distention.  Soft nontender Other:  No significant edema noted in lower extremities bilateral  Left arm from the elbow to the hand has moderate edema and over the forearm circumferentially there is erythema and warmth to touch.  There is no purulent drainage there is no open lesions.  Patient reports area to be quite tender and painful as well.  No obvious area of central induration or abscess type formation.   ED Results / Procedures / Treatments   Labs (all labs ordered are listed, but only abnormal results are displayed) Labs Reviewed  LACTIC ACID, PLASMA - Abnormal; Notable for the following components:      Result Value   Lactic Acid, Venous 2.2 (*)    All other components within normal limits  COMPREHENSIVE METABOLIC PANEL - Abnormal; Notable for the following components:   Glucose, Bld 157 (*)  BUN 38 (*)    Creatinine, Ser 6.81 (*)    Calcium 8.5 (*)    Total Protein 5.9 (*)    Albumin 2.8 (*)    GFR, Estimated 6 (*)    All other components within normal limits  CBC WITH DIFFERENTIAL/PLATELET - Abnormal; Notable for the following components:   RBC 3.35 (*)    Hemoglobin 10.0 (*)    HCT 32.8 (*)    RDW 16.2 (*)    All other components within normal limits  APTT - Abnormal; Notable for the following components:   aPTT 48 (*)    All other components within normal limits  CULTURE, BLOOD (ROUTINE X 2)  CULTURE, BLOOD (ROUTINE X 2)  URINE CULTURE  PROTIME-INR  LACTIC ACID, PLASMA  URINALYSIS, COMPLETE (UACMP) WITH MICROSCOPIC  HEPATITIS B SURFACE ANTIGEN  HEPATITIS B SURFACE ANTIBODY, QUANTITATIVE  HEPATITIS B E ANTIBODY  PROCALCITONIN  SEDIMENTATION RATE  C-REACTIVE PROTEIN     EKG  Interpreted by me at 1055 heart rate 65 QRS normal, QTc normal, normal sinus rhythm.  No evidence of  acute ischemia or ectopy.   RADIOLOGY  Chest x-ray interpreted by me as negative for acute finding or infiltrate  US Venous Img Upper Uni Left  Result Date: 12/11/2021 CLINICAL DATA:  Pain and swelling in LEFT arm, dog by 2 weeks ago, has fistula in same arm EXAM: LEFT UPPER EXTREMITY VENOUS DOPPLER ULTRASOUND TECHNIQUE: Gray-scale sonography with graded compression, as well as color Doppler and duplex ultrasound were performed to evaluate the upper extremity deep venous system from the level of the subclavian vein and including the jugular, axillary, basilic, radial, ulnar and upper cephalic vein. Spectral Doppler was utilized to evaluate flow at rest and with distal augmentation maneuvers. COMPARISON:  11/19/2021 FINDINGS: Contralateral Subclavian Vein: Respiratory phasicity is normal and symmetric with the symptomatic side. No evidence of thrombus. Normal compressibility. Internal Jugular Vein: No evidence of thrombus. Normal compressibility, respiratory phasicity and response to augmentation. Subclavian Vein: No evidence of thrombus. Normal compressibility, respiratory phasicity and response to augmentation. Axillary Vein: No evidence of thrombus. Normal compressibility, respiratory phasicity and response to augmentation. Cephalic Vein: No evidence of thrombus. Normal compressibility, respiratory phasicity and response to augmentation. Basilic Vein: No evidence of thrombus. Normal compressibility, respiratory phasicity and response to augmentation. Brachial Veins: No evidence of thrombus. Normal compressibility, respiratory phasicity and response to augmentation. Radial Veins: No evidence of thrombus. Normal compressibility, respiratory phasicity and response to augmentation. Ulnar Veins: No evidence of thrombus. Normal compressibility, respiratory phasicity and response to augmentation. Venous Reflux:  None visualized. Other Findings: Dialysis AV fistula present, patent. Scattered soft tissue swelling  LEFT forearm. IMPRESSION: No evidence of DVT within the LEFT upper extremity. Patent dialysis fistula. Electronically Signed   By: Lavonia Dana M.D.   On: 12/11/2021 11:29       PROCEDURES:  Critical Care performed: No  Procedures   MEDICATIONS ORDERED IN ED: Medications  ceFEPIme (MAXIPIME) 2 g in sodium chloride 0.9 % 100 mL IVPB (has no administration in time range)  vancomycin (VANCOREADY) IVPB 1500 mg/300 mL (has no administration in time range)  Chlorhexidine Gluconate Cloth 2 % PADS 6 each (has no administration in time range)  ondansetron (ZOFRAN) injection 4 mg (has no administration in time range)  acetaminophen (TYLENOL) tablet 650 mg (has no administration in time range)  hydrALAZINE (APRESOLINE) injection 5 mg (has no administration in time range)  heparin injection 5,000 Units (has no administration in time range)  metroNIDAZOLE (FLAGYL) IVPB 500 mg (500 mg Intravenous New Bag/Given 12/11/21 1100)  ondansetron (ZOFRAN) injection 4 mg (4 mg Intravenous Given 12/11/21 1054)  HYDROmorphone (DILAUDID) injection 0.5 mg (0.5 mg Intravenous Given 12/11/21 1056)     IMPRESSION / MDM / ASSESSMENT AND PLAN / ED COURSE  I reviewed the triage vital signs and the nursing notes.                              Differential diagnosis includes, but is not limited to, recurrent cellulitis of the left forearm, no clear evidence to support abscess or fluid collection by clinical exam.  Exclude DVT thrombophlebitis etc., ultrasound left upper extremity negative for DVT.  Given the recurrent nature of this and representation as well as the patient's history of end-stage renal disease at risk for resistant infection will admit to hospital for further care and treatment as well as broad-spectrum antibiotic.  Appreciate nephrology consultation who will manage end-stage renal disease needs.  Lactic acid very minimally elevated, suspect likely not secondary to hypoperfusion but likely he has patient is  a hemodialysis patient.  Labs show creatinine 6.8.  Normal potassium and sodium.  Hemoglobin 10  Patient's presentation is most consistent with acute illness / injury with system symptoms.  ----------------------------------------- 10:50 AM on 12/11/2021 ----------------------------------------- Consulted with Dr. Murlean Iba.  Nephrology service will provide consultation today.  Advised given she is a dialysis patient she should have antibiotic coverage including coverage for possible MRSA.  Discussed with pharmacy, will start on broad-spectrum cefepime as well as add Flagyl for anaerobic coverage.  Antibiotics to be further tailored moving forward as anticipate need for hospitalization for a recurrent or resistant infection involving left forearm which appears to be cellulitis  The patient is on the cardiac monitor to evaluate for evidence of arrhythmia and/or significant heart rate changes.  Patient does not presently meet sepsis criteria  Clinical Course as of 12/11/21 1209  Mon Dec 11, 2021  1206 Consulted with Dr. Blaine Hamper of hospitalist service who is excepted patient and admission [MQ]    Clinical Course User Index [MQ] Delman Kitten, MD     FINAL CLINICAL IMPRESSION(S) / ED DIAGNOSES   Final diagnoses:  Left arm cellulitis  ESRD (end stage renal disease) on dialysis [facility])     Rx / DC Orders   ED Discharge Orders     None        Note:  This document was prepared using Dragon voice recognition software and may include unintentional dictation errors.   Delman Kitten, MD 12/11/21 1209

## 2021-12-12 ENCOUNTER — Inpatient Hospital Stay: Payer: Medicaid Other

## 2021-12-12 DIAGNOSIS — L03114 Cellulitis of left upper limb: Secondary | ICD-10-CM | POA: Diagnosis not present

## 2021-12-12 LAB — BASIC METABOLIC PANEL
Anion gap: 10 (ref 5–15)
BUN: 44 mg/dL — ABNORMAL HIGH (ref 8–23)
CO2: 24 mmol/L (ref 22–32)
Calcium: 8.3 mg/dL — ABNORMAL LOW (ref 8.9–10.3)
Chloride: 103 mmol/L (ref 98–111)
Creatinine, Ser: 6.79 mg/dL — ABNORMAL HIGH (ref 0.44–1.00)
GFR, Estimated: 6 mL/min — ABNORMAL LOW (ref >60)
Glucose, Bld: 93 mg/dL (ref 70–99)
Potassium: 4.5 mmol/L (ref 3.5–5.1)
Sodium: 137 mmol/L (ref 135–145)

## 2021-12-12 LAB — PHOSPHORUS: Phosphorus: 5.6 mg/dL — ABNORMAL HIGH (ref 2.5–4.6)

## 2021-12-12 LAB — C-REACTIVE PROTEIN: CRP: 0.7 mg/dL (ref <1.0)

## 2021-12-12 LAB — HEPATITIS B SURFACE ANTIGEN: Hepatitis B Surface Ag: NONREACTIVE

## 2021-12-12 MED ORDER — ONDANSETRON HCL 4 MG/2ML IJ SOLN
INTRAMUSCULAR | Status: AC
  Filled 2021-12-12: qty 2

## 2021-12-12 MED ORDER — PENTAFLUOROPROP-TETRAFLUOROETH EX AERO: 1.0000 | INHALATION_SPRAY | CUTANEOUS | Status: DC | PRN

## 2021-12-12 MED ORDER — LORAZEPAM 2 MG/ML IJ SOLN
0.5000 mg | Freq: Once | INTRAMUSCULAR | Status: AC | PRN
  Administered 2021-12-12: 0.5 mg via INTRAVENOUS
  Filled 2021-12-12: qty 1

## 2021-12-12 MED ORDER — ANTICOAGULANT SODIUM CITRATE 4% (200MG/5ML) IV SOLN: 5.0000 mL | Status: DC | PRN

## 2021-12-12 MED ORDER — HEPARIN SODIUM (PORCINE) 1000 UNIT/ML DIALYSIS
1000.0000 [IU] | INTRAMUSCULAR | Status: DC | PRN
Start: 2021-12-12 — End: 2021-12-12

## 2021-12-12 MED ORDER — DIPHENHYDRAMINE HCL 50 MG/ML IJ SOLN
INTRAMUSCULAR | Status: AC
  Filled 2021-12-12: qty 1

## 2021-12-12 MED ORDER — ONDANSETRON HCL 4 MG/2ML IJ SOLN
4.0000 mg | INTRAMUSCULAR | Status: DC | PRN
Start: 2021-12-12 — End: 2021-12-25
  Administered 2021-12-16 – 2021-12-21 (×3): 4 mg via INTRAVENOUS
  Filled 2021-12-12: qty 2

## 2021-12-12 MED ORDER — DIPHENHYDRAMINE HCL 50 MG/ML IJ SOLN
25.0000 mg | INTRAMUSCULAR | Status: DC | PRN
Start: 2021-12-12 — End: 2021-12-25
  Administered 2021-12-12 – 2021-12-24 (×10): 25 mg via INTRAVENOUS
  Filled 2021-12-12 (×9): qty 1

## 2021-12-12 MED ORDER — ALTEPLASE 2 MG IJ SOLR: 2.0000 mg | Freq: Once | INTRAMUSCULAR | Status: DC | PRN

## 2021-12-12 MED ORDER — LIDOCAINE HCL (PF) 1 % IJ SOLN: 5.0000 mL | INTRAMUSCULAR | Status: DC | PRN

## 2021-12-12 MED ORDER — HEPARIN SODIUM (PORCINE) 1000 UNIT/ML IJ SOLN
INTRAMUSCULAR | Status: AC
  Filled 2021-12-12: qty 5

## 2021-12-12 MED ORDER — GADOBUTROL 1 MMOL/ML IV SOLN
7.0000 mL | Freq: Once | INTRAVENOUS | Status: AC | PRN
  Administered 2021-12-12: 7 mL via INTRAVENOUS

## 2021-12-12 MED ORDER — SODIUM CHLORIDE 0.9 % IV SOLN
2.0000 g | INTRAVENOUS | Status: DC
  Administered 2021-12-13: 2 g via INTRAVENOUS
  Filled 2021-12-12: qty 2

## 2021-12-12 MED ORDER — LIDOCAINE-PRILOCAINE 2.5-2.5 % EX CREA: 1.0000 | TOPICAL_CREAM | CUTANEOUS | Status: DC | PRN

## 2021-12-12 MED ORDER — LORAZEPAM 2 MG/ML IJ SOLN
1.0000 mg | Freq: Once | INTRAMUSCULAR | Status: AC | PRN
Start: 2021-12-12 — End: 2021-12-12
  Administered 2021-12-12: 1 mg via INTRAVENOUS
  Filled 2021-12-12: qty 1

## 2021-12-12 NOTE — Progress Notes (Signed)
Received patient in bed to unit.  Alert and oriented.  Informed consent signed and in  chart.   Treatment initiated: 0930 Treatment completed: 1307  Patient tolerated well.  Transported back to the room  alert, without acute distress.  Hand-off given to patient's nurse.   Access used: Catheter Access issues: none  Total UF removed: 1500cc Medication(s) given: Benadryl, Zofran, Vanc Post HD VS: BP-137/72(93), HR-71, RR-16, Temp-98.5, SP02-100 Post HD weight: 74.3kg   [facility]

## 2021-12-12 NOTE — Progress Notes (Addendum)
[facility]  ROUNDING NOTE   Subjective:   Katie [name] is a 66 year old female with past medical history of ESRD with hemodialysis TTS, diabetes mellitus, and hypertension. She presents to ED with worsening left lower arm swelling and redness. Patient has been admitted for ESRD (end stage renal disease) on dialysis ([place]) [N18.6, Z99.2] Cellulitis of left upper extremity [L03.114] Left arm cellulitis [F81.017]  Patient is known to our practice and receives outpatient dialysis treatments at [facility] on a TTS schedule, supervised by Dr. Holley Raring.  Patient missed treatment on Saturday due to transportation issues.    Patient seen and evaluated during dialysis   HEMODIALYSIS FLOWSHEET:  Blood Flow Rate (mL/min): 400 mL/min Arterial Pressure (mmHg): -280 mmHg Venous Pressure (mmHg): 110 mmHg TMP (mmHg): 14 mmHg Ultrafiltration Rate (mL/min): 975 mL/min Dialysate Flow Rate (mL/min): 300 ml/min Dialysis Fluid Bolus: Normal Saline Bolus Amount (mL): 300 mL  No complaints at this time Left arm remains edematous, warm to touch   Objective:  Vital signs in last 24 hours:  Temp:  [97.7 F (36.5 C)-98.5 F (36.9 C)] 98 F (36.7 C) (08/01 1343) Pulse Rate:  [63-79] 75 (08/01 1343) Resp:  [9-20] 16 (08/01 1343) BP: (88-185)/(58-100) 159/70 (08/01 1343) SpO2:  [95 %-100 %] 98 % (08/01 1343) Weight:  [74.3 kg-76.1 kg] 74.3 kg (08/01 1337)  Weight change:  Filed Weights   12/12/21 0500 12/12/21 0900 12/12/21 1337  Weight: 76.1 kg 75.8 kg 74.3 kg    Intake/Output: I/O last 3 completed shifts: In: 660.9 [P.O.:480; IV Piggyback:180.9] Out: -    Intake/Output this shift:  Total I/O In: -  Out: 1500 [Other:1500]  Physical Exam: General: NAD  Head: Normocephalic, atraumatic. Moist oral mucosal membranes  Eyes: Anicteric  Lungs:  Clear to auscultation, normal effort, room air  Heart: Regular rate and rhythm  Abdomen:  Soft, nontender  Extremities: 1+  peripheral edema.  Left upper extremity 2+ edema  Neurologic: Nonfocal, moving all four extremities  Skin: No lesions left upper extremity erythema  Access: Right chest PermCath    Basic Metabolic Panel: Recent Labs  Lab 12/11/21 1021 12/12/21 0439  NA 139 137  K 4.5 4.5  CL 103 103  CO2 26 24  GLUCOSE 157* 93  BUN 38* 44*  CREATININE 6.81* 6.79*  CALCIUM 8.5* 8.3*  PHOS  --  5.6*    Liver Function Tests: Recent Labs  Lab 12/11/21 1021  AST 20  ALT 17  ALKPHOS 83  BILITOT 0.7  PROT 5.9*  ALBUMIN 2.8*   No results for input(s): "LIPASE", "AMYLASE" in the last 168 hours. No results for input(s): "AMMONIA" in the last 168 hours.  CBC: Recent Labs  Lab 12/11/21 1021  WBC 6.4  NEUTROABS 3.8  HGB 10.0*  HCT 32.8*  MCV 97.9  PLT 219    Cardiac Enzymes: No results for input(s): "CKTOTAL", "CKMB", "CKMBINDEX", "TROPONINI" in the last 168 hours.  BNP: Invalid input(s): "POCBNP"  CBG: No results for input(s): "GLUCAP" in the last 168 hours.  Microbiology: Results for orders placed or performed during the hospital encounter of 12/11/21  Blood Culture (routine x 2)     Status: None (Preliminary result)   Collection Time: 12/11/21 10:21 AM   Specimen: BLOOD  Result Value Ref Range Status   Specimen Description BLOOD LEFT [facility]  Final   Special Requests   Final    BOTTLES DRAWN AEROBIC AND ANAEROBIC Blood Culture adequate volume   Culture   Final  NO GROWTH < 24 HOURS Performed at [facility], [address] [place], [place] [address]    Report Status PENDING  Incomplete  Blood Culture (routine x 2)     Status: None (Preliminary result)   Collection Time: 12/11/21 10:21 AM   Specimen: BLOOD  Result Value Ref Range Status   Specimen Description BLOOD RIGHT ARM  Final   Special Requests   Final    BOTTLES DRAWN AEROBIC AND ANAEROBIC Blood Culture results may not be optimal due to an excessive volume of blood received in culture bottles   Culture    Final    NO GROWTH < 24 HOURS Performed at [facility], [address] [place], [place] [address]    Report Status PENDING  Incomplete    Coagulation Studies: Recent Labs    12/11/21 1021  LABPROT 14.2  INR 1.1    Urinalysis: Recent Labs    12/11/21 1736  COLORURINE YELLOW*  LABSPEC 1.013  PHURINE 8.0  GLUCOSEU NEGATIVE  HGBUR NEGATIVE  BILIRUBINUR NEGATIVE  KETONESUR NEGATIVE  PROTEINUR 100*  NITRITE NEGATIVE  LEUKOCYTESUR LARGE*      Imaging: US Venous Img Upper Uni Left  Result Date: 12/11/2021 CLINICAL DATA:  Pain and swelling in LEFT arm, dog by 2 weeks ago, has fistula in same arm EXAM: LEFT UPPER EXTREMITY VENOUS DOPPLER ULTRASOUND TECHNIQUE: Gray-scale sonography with graded compression, as well as color Doppler and duplex ultrasound were performed to evaluate the upper extremity deep venous system from the level of the subclavian vein and including the jugular, axillary, basilic, radial, ulnar and upper cephalic vein. Spectral Doppler was utilized to evaluate flow at rest and with distal augmentation maneuvers. COMPARISON:  11/19/2021 FINDINGS: Contralateral Subclavian Vein: Respiratory phasicity is normal and symmetric with the symptomatic side. No evidence of thrombus. Normal compressibility. Internal Jugular Vein: No evidence of thrombus. Normal compressibility, respiratory phasicity and response to augmentation. Subclavian Vein: No evidence of thrombus. Normal compressibility, respiratory phasicity and response to augmentation. Axillary Vein: No evidence of thrombus. Normal compressibility, respiratory phasicity and response to augmentation. Cephalic Vein: No evidence of thrombus. Normal compressibility, respiratory phasicity and response to augmentation. Basilic Vein: No evidence of thrombus. Normal compressibility, respiratory phasicity and response to augmentation. Brachial Veins: No evidence of thrombus. Normal compressibility, respiratory phasicity  and response to augmentation. Radial Veins: No evidence of thrombus. Normal compressibility, respiratory phasicity and response to augmentation. Ulnar Veins: No evidence of thrombus. Normal compressibility, respiratory phasicity and response to augmentation. Venous Reflux:  None visualized. Other Findings: Dialysis AV fistula present, patent. Scattered soft tissue swelling LEFT forearm. IMPRESSION: No evidence of DVT within the LEFT upper extremity. Patent dialysis fistula. Electronically Signed   By: Lavonia Dana M.D.   On: 12/11/2021 11:29   DG Chest Port 1 View  Result Date: 12/11/2021 CLINICAL DATA:  Questionable sepsis. EXAM: PORTABLE CHEST 1 VIEW COMPARISON:  November 23, 2021 FINDINGS: Patient is rotated. Right chest dual lumen catheter with tips projecting over the superior cavoatrial junction/right atrium. Enlarged cardiac silhouette. Aortic atherosclerosis. Linear band of scarring versus atelectasis in the left lung base. Bibasilar atelectasis. No new focal airspace consolidation. No visible pleural effusion or pneumothorax. No acute osseous abnormality. IMPRESSION: Similar bibasilar atelectasis. No new focal airspace consolidation. Electronically Signed   By: Dahlia Bailiff M.D.   On: 12/11/2021 10:58     Medications:    cefTRIAXone (ROCEPHIN)  IV     metronidazole 500 mg (12/11/21 2225)   vancomycin 750 mg (12/12/21  1204)    carvedilol  6.25 mg Oral QPM   Chlorhexidine Gluconate Cloth  6 each Topical Q0600   citalopram  10 mg Oral Daily   furosemide  40 mg Oral Daily   heparin  5,000 Units Subcutaneous Q8H   heparin sodium (porcine)       pantoprazole  40 mg Oral BID   traZODone  50 mg Oral QHS   acetaminophen, diphenhydrAMINE, heparin sodium (porcine), hydrALAZINE, HYDROmorphone (DILAUDID) injection, ondansetron (ZOFRAN) IV, ondansetron (ZOFRAN) IV, oxyCODONE-acetaminophen, senna-docusate, tiZANidine, topiramate  Assessment/ Plan:  [name] is a 66 y.o.  female   with past  medical history of ESRD with hemodialysis TTS, diabetes mellitus, and hypertension. She presents to ED with worsening left lower arm swelling and redness. Patient has been admitted for ESRD (end stage renal disease) on dialysis ([place]) [N18.6, Z99.2] Cellulitis of left upper extremity [L03.114] Left arm cellulitis [F42.395]   [facility]/TTS/right PermCath/83.5 kg  End-stage renal disease on hemodialysis.  Will maintain outpatient schedule if possible.  Receiving scheduled dialysis today, UF goal 1 L as tolerated.  Next treatment scheduled for Thursday.  Patient currently experiencing low blood pressure during treatment, UF turned off.  We will continue to monitor.  2. Anemia of chronic kidney disease Lab Results  Component Value Date   HGB 10.0 (L) 12/11/2021    We will continue to monitor.  Patient receives [place] outpatient.  3. Secondary Hyperparathyroidism:  Lab Results  Component Value Date   PTH 70 (H) 05/12/2020   CALCIUM 8.3 (L) 12/12/2021   PHOS 5.6 (H) 12/12/2021    Calcium remains acceptable. Phosphorus remains elevated. Monitoring   4.  Hypertension with chronic kidney disease.  Home regimen includes carvedilol and furosemide 40 mg daily.  Currently receiving these inpatient.  Monitoring blood pressure during dialysis.  5.  Suspected left arm cellulitis evidenced by edema, erythema, warm to touch.  Likely secondary to wound infection.  Evaluation ongoing.  Currently prescribed cefepime and metronidazole.  Receiving vancomycin 750 mg with dialysis treatments.   LOS: 1 [place] 8/1/20231:56 PM

## 2021-12-12 NOTE — Progress Notes (Signed)
       CROSS COVER NOTE  NAME: Katie [name] MRN: [id number] DOB : July 09, 1955    Date of Service   12/12/2021  HPI/Events of Note   Medication request received from nursing for MRI anxiety. M(r)s Katie [name] received 1 mg IV Ativan at 2007 and is becoming anxious while still in MRI with ~20-30 min left in study.  Interventions   Plan: 0.5 mg IV ativan      This document was prepared using Dragon voice recognition software and may include unintentional dictation errors.  Neomia Glass DNP, MHA, FNP-BC Nurse Practitioner [facility] - [facility] Pager [phone]

## 2021-12-12 NOTE — Progress Notes (Signed)
PROGRESS NOTE    Katie [name]  JKK:[id number] DOB: 1955-08-13 DOA: 12/11/2021 PCP: Pcp, No    Brief Narrative:  66 y.o. female with medical history significant of ESRD-HD (TTS), hypertension, diet-controlled diabetes mellitus, depression with anxiety, dCHF, anemia, who presents with left forearm pain and swelling.   Pt states that she had dog bite about one month ago. Pt was treated for dog bite infection on left forearm. She completed 2 week doxycycline treatment. She states that her left forearm of pain has been progressively worsening the past several days.  She also has swelling, erythema in left forearm.  Denies fever or chills.  States that the pain is constant, sharp, severe, 10 out of 10 in severity, nonradiating.  Patient does not have chest pain, cough, shortness breath. She has nausea, no vomiting, diarrhea or abdominal pain.  No symptoms of UTI.  Patient also hasn't had dialysis since 7/20 when a partial dialysis was completed. She has an old left arm fistula, but currently receives dialysis through a right subclavian.  Left upper extremity significantly erythematous, painful, tender to touch.  Palpable radial pulse on affected extremity.  Good grip strength   Assessment & Plan:   Principal Problem:   Cellulitis of left upper extremity Active Problems:   ESRD (end stage renal disease) (HCC)   Hypertension   Anemia in ESRD (end-stage renal disease) (HCC)   Elevated lactic acid level   Chronic diastolic CHF (congestive heart failure) (HCC)   Depression, unspecified   Type II diabetes mellitus with renal manifestations (HCC)  Cellulitis of left upper extremity Significant swelling and erythema of left upper extremity.  Painful to palpation.  Good grip strength with adequate radial pulse on affected extremity.  Does not meet criteria for sepsis.  Negative for fever or leukocytosis. Plan: Continue broad-spectrum IV antibiotics for now Check MRI left arm (forearm and humerus)  with and without contrast Follow-up blood culture, no growth to date APAP as needed fever Multimodal pain control  ESRD on HD Dialysis access is permacath in right subclavian Nephrology consulted from admission HD planned today 8/1  Essential hypertension PTA Coreg and Lasix As needed IV hydralazine  Anemia chronic kidney disease Hemoglobin stable, no indication for transfusion  Elevated lactic acid Lactic acid 2.2.  Still does not meet criteria for sepsis Lactic acid cleared No IV fluids  Chronic diastolic congestive heart failure No evidence of exacerbation.  Last EF 60 to 65% Volume removal with dialysis PTA Lasix  Depression PTA Celexa  DVT prophylaxis: SQ heparin Code Status: Full code Family Communication: None today.  Offered to call but patient declined Disposition Plan: Status is: Inpatient Remains inpatient appropriate because: Left upper extremity cellulitis on IV antibiotics   Level of care: Telemetry Medical  Consultants:  Nephrology  Procedures:  None  Antimicrobials: Ceftriaxone Vancomycin  Metronidazole   Subjective: Seen and examined.  Resting comfortably in bed.  No visible distress.  Only complaint is pain in left arm.  Objective: Vitals:   12/12/21 0930 12/12/21 1000 12/12/21 1030 12/12/21 1100  BP: (!) 180/73 (!) 185/100 (!) 167/75 111/68  Pulse: 66 79 71 71  Resp: 19 17 14  (!) 9  Temp:      TempSrc:      SpO2: 99% 97% 98% 97%  Weight:      Height:        Intake/Output Summary (Last 24 hours) at 12/12/2021 1130 Last data filed at 12/11/2021 1904 Gross per 24 hour  Intake 660.89 ml  Output --  Net 660.89 ml   Filed Weights   12/11/21 1014 12/12/21 0500 12/12/21 0900  Weight: 78.1 kg 76.1 kg 75.8 kg    Examination:  General exam: NAD.  Appears chronically ill Respiratory system: Bibasilar crackles.  Normal work of breathing.  Room air Cardiovascular system: S1-S2, RRR, no murmurs, no pedal edema Gastrointestinal  system: Soft, NT/ND, normal bowel sounds Central nervous system: Alert and oriented. No focal neurological deficits. Extremities: Decreased power left upper extremity.  Gait not assessed Skin: Left arm with significant erythema and swelling, decreased grip strength, pain on palpation Psychiatry: Judgement and insight appear normal. Mood & affect appropriate.     Data Reviewed: I have personally reviewed following labs and imaging studies  CBC: Recent Labs  Lab 12/11/21 1021  WBC 6.4  NEUTROABS 3.8  HGB 10.0*  HCT 32.8*  MCV 97.9  PLT 423   Basic Metabolic Panel: Recent Labs  Lab 12/11/21 1021 12/12/21 0439  NA 139 137  K 4.5 4.5  CL 103 103  CO2 26 24  GLUCOSE 157* 93  BUN 38* 44*  CREATININE 6.81* 6.79*  CALCIUM 8.5* 8.3*   GFR: Estimated Creatinine Clearance: 8.8 mL/min (A) (by C-G formula based on SCr of 6.79 mg/dL (H)). Liver Function Tests: Recent Labs  Lab 12/11/21 1021  AST 20  ALT 17  ALKPHOS 83  BILITOT 0.7  PROT 5.9*  ALBUMIN 2.8*   No results for input(s): "LIPASE", "AMYLASE" in the last 168 hours. No results for input(s): "AMMONIA" in the last 168 hours. Coagulation Profile: Recent Labs  Lab 12/11/21 1021  INR 1.1   Cardiac Enzymes: No results for input(s): "CKTOTAL", "CKMB", "CKMBINDEX", "TROPONINI" in the last 168 hours. BNP (last 3 results) No results for input(s): "PROBNP" in the last 8760 hours. HbA1C: No results for input(s): "HGBA1C" in the last 72 hours. CBG: No results for input(s): "GLUCAP" in the last 168 hours. Lipid Profile: No results for input(s): "CHOL", "HDL", "LDLCALC", "TRIG", "CHOLHDL", "LDLDIRECT" in the last 72 hours. Thyroid Function Tests: No results for input(s): "TSH", "T4TOTAL", "FREET4", "T3FREE", "THYROIDAB" in the last 72 hours. Anemia Panel: No results for input(s): "VITAMINB12", "FOLATE", "FERRITIN", "TIBC", "IRON", "RETICCTPCT" in the last 72 hours. Sepsis Labs: Recent Labs  Lab 12/11/21 1021  12/11/21 1943  PROCALCITON 0.14  --   LATICACIDVEN 2.2* 0.9    Recent Results (from the past 240 hour(s))  Blood Culture (routine x 2)     Status: None (Preliminary result)   Collection Time: 12/11/21 10:21 AM   Specimen: BLOOD  Result Value Ref Range Status   Specimen Description BLOOD LEFT AC  Final   Special Requests   Final    BOTTLES DRAWN AEROBIC AND ANAEROBIC Blood Culture adequate volume   Culture   Final    NO GROWTH < 24 HOURS Performed at [facility], [address], [place], [place] [address]    Report Status PENDING  Incomplete  Blood Culture (routine x 2)     Status: None (Preliminary result)   Collection Time: 12/11/21 10:21 AM   Specimen: BLOOD  Result Value Ref Range Status   Specimen Description BLOOD RIGHT ARM  Final   Special Requests   Final    BOTTLES DRAWN AEROBIC AND ANAEROBIC Blood Culture results may not be optimal due to an excessive volume of blood received in culture bottles   Culture   Final    NO GROWTH < 24 HOURS Performed at [facility], [address] [place], [name]  [address]    Report Status PENDING  Incomplete         Radiology Studies: US Venous Img Upper Uni Left  Result Date: 12/11/2021 CLINICAL DATA:  Pain and swelling in LEFT arm, dog by 2 weeks ago, has fistula in same arm EXAM: LEFT UPPER EXTREMITY VENOUS DOPPLER ULTRASOUND TECHNIQUE: Gray-scale sonography with graded compression, as well as color Doppler and duplex ultrasound were performed to evaluate the upper extremity deep venous system from the level of the subclavian vein and including the jugular, axillary, basilic, radial, ulnar and upper cephalic vein. Spectral Doppler was utilized to evaluate flow at rest and with distal augmentation maneuvers. COMPARISON:  11/19/2021 FINDINGS: Contralateral Subclavian Vein: Respiratory phasicity is normal and symmetric with the symptomatic side. No evidence of thrombus. Normal compressibility. Internal Jugular  Vein: No evidence of thrombus. Normal compressibility, respiratory phasicity and response to augmentation. Subclavian Vein: No evidence of thrombus. Normal compressibility, respiratory phasicity and response to augmentation. Axillary Vein: No evidence of thrombus. Normal compressibility, respiratory phasicity and response to augmentation. Cephalic Vein: No evidence of thrombus. Normal compressibility, respiratory phasicity and response to augmentation. Basilic Vein: No evidence of thrombus. Normal compressibility, respiratory phasicity and response to augmentation. Brachial Veins: No evidence of thrombus. Normal compressibility, respiratory phasicity and response to augmentation. Radial Veins: No evidence of thrombus. Normal compressibility, respiratory phasicity and response to augmentation. Ulnar Veins: No evidence of thrombus. Normal compressibility, respiratory phasicity and response to augmentation. Venous Reflux:  None visualized. Other Findings: Dialysis AV fistula present, patent. Scattered soft tissue swelling LEFT forearm. IMPRESSION: No evidence of DVT within the LEFT upper extremity. Patent dialysis fistula. Electronically Signed   By: Lavonia Dana M.D.   On: 12/11/2021 11:29   DG Chest Port 1 View  Result Date: 12/11/2021 CLINICAL DATA:  Questionable sepsis. EXAM: PORTABLE CHEST 1 VIEW COMPARISON:  November 23, 2021 FINDINGS: Patient is rotated. Right chest dual lumen catheter with tips projecting over the superior cavoatrial junction/right atrium. Enlarged cardiac silhouette. Aortic atherosclerosis. Linear band of scarring versus atelectasis in the left lung base. Bibasilar atelectasis. No new focal airspace consolidation. No visible pleural effusion or pneumothorax. No acute osseous abnormality. IMPRESSION: Similar bibasilar atelectasis. No new focal airspace consolidation. Electronically Signed   By: Dahlia Bailiff M.D.   On: 12/11/2021 10:58        Scheduled Meds:  carvedilol  6.25 mg Oral QPM    Chlorhexidine Gluconate Cloth  6 each Topical Q0600   citalopram  10 mg Oral Daily   furosemide  40 mg Oral Daily   heparin  5,000 Units Subcutaneous Q8H   pantoprazole  40 mg Oral BID   traZODone  50 mg Oral QHS   Continuous Infusions:  anticoagulant sodium citrate     cefTRIAXone (ROCEPHIN)  IV     metronidazole 500 mg (12/11/21 2225)   vancomycin       LOS: 1 day     Sidney Ace, MD [facility]   If 7PM-7AM, please contact night-coverage  12/12/2021, 11:30 AM

## 2021-12-12 NOTE — Plan of Care (Signed)

## 2021-12-12 NOTE — Plan of Care (Signed)
  Problem: Clinical Measurements: Goal: Will remain free from infection Outcome: Not Met (add Reason) Note: Cellulitis Goal: Cardiovascular complication will be avoided Outcome: Progressing   Problem: Activity: Goal: Risk for activity intolerance will decrease Outcome: Progressing   Problem: Nutrition: Goal: Adequate nutrition will be maintained Outcome: Progressing   Problem: Coping: Goal: Level of anxiety will decrease Outcome: Progressing   Problem: Elimination: Goal: Will not experience complications related to urinary retention Outcome: Not Applicable Note: Hemodialysis patient   Problem: Pain Managment: Goal: General experience of comfort will improve Outcome: Progressing   Problem: Safety: Goal: Ability to remain free from injury will improve Outcome: Progressing   Problem: Skin Integrity: Goal: Risk for impaired skin integrity will decrease Outcome: Progressing

## 2021-12-12 NOTE — Plan of Care (Signed)

## 2021-12-12 NOTE — Progress Notes (Signed)
Pharmacy Antibiotic Note  Katie [name] is a 66 y.o. female admitted on 12/11/2021 with  wound infection . Patient recently completed 2 weeks of doxycycline after being bitten on left forearm about one month ago. Patient endorses worsening pain the past several days with swelling, erythema on left forearm. Pharmacy has been consulted for Vancomycin dosing. Patient is ESRD on dialysis every Tue/Thur/Sat schedule.  Assessment: 7/31: MRI shows no evidence of DVT 8/1: Afebrile. WBC and Vital signs WNL.  Plan: Vancomycin 1500mg  IV loading dose followed by maintenance dose of 750mg  IV at the end of each dialysis session (TTS)  Will check trough level prior to 3rd dose.  Height: 5\' 6"  (167.6 cm) Weight: 75.8 kg (167 lb 1.7 oz) IBW/kg (Calculated) : 59.3  Temp (24hrs), Avg:98 F (36.7 C), Min:97.7 F (36.5 C), Max:98.4 F (36.9 C)  Recent Labs  Lab 12/11/21 1021 12/11/21 1943 12/12/21 0439  WBC 6.4  --   --   CREATININE 6.81*  --  6.79*  LATICACIDVEN 2.2* 0.9  --      Estimated Creatinine Clearance: 8.8 mL/min (A) (by C-G formula based on SCr of 6.79 mg/dL (H)).    Allergies  Allergen Reactions   Penicillins Nausea And Vomiting    Pt states "only pills" make her vomit Other reaction(s): Nausea/Vomit    Antimicrobials this admission: Cefepime 7/31 x1 Vancomycin 7/31 >>  Metronidazole 7/31 >> Ceftriaxone 8/1 >>  Microbiology results: Bcx NGTD <24 hrs  Thank you for allowing pharmacy to be a part of this patient's care.  Dara Hoyer, PharmD PGY-1 Pharmacy Resident 12/12/2021 9:34 AM

## 2021-12-13 ENCOUNTER — Encounter: Payer: Self-pay | Admitting: Internal Medicine

## 2021-12-13 DIAGNOSIS — Z992 Dependence on renal dialysis: Secondary | ICD-10-CM

## 2021-12-13 DIAGNOSIS — I120 Hypertensive chronic kidney disease with stage 5 chronic kidney disease or end stage renal disease: Secondary | ICD-10-CM

## 2021-12-13 DIAGNOSIS — D631 Anemia in chronic kidney disease: Secondary | ICD-10-CM | POA: Diagnosis not present

## 2021-12-13 DIAGNOSIS — N186 End stage renal disease: Secondary | ICD-10-CM | POA: Diagnosis not present

## 2021-12-13 DIAGNOSIS — L03114 Cellulitis of left upper limb: Secondary | ICD-10-CM | POA: Diagnosis not present

## 2021-12-13 DIAGNOSIS — Z87891 Personal history of nicotine dependence: Secondary | ICD-10-CM

## 2021-12-13 LAB — HEPATITIS B E ANTIBODY: Hep B E Ab: NEGATIVE

## 2021-12-13 LAB — HEPATITIS B SURFACE ANTIBODY, QUANTITATIVE: Hep B S AB Quant (Post): 4.6 m[IU]/mL — ABNORMAL LOW (ref >9.9)

## 2021-12-13 LAB — GLUCOSE, CAPILLARY: Glucose-Capillary: 104 mg/dL — ABNORMAL HIGH (ref 70–99)

## 2021-12-13 MED ORDER — DIPHENHYDRAMINE HCL 50 MG/ML IJ SOLN
INTRAMUSCULAR | Status: AC
  Administered 2021-12-13: 25 mg via INTRAVENOUS
  Filled 2021-12-13: qty 1

## 2021-12-13 MED ORDER — VANCOMYCIN HCL 750 MG/150ML IV SOLN
750.0000 mg | Freq: Once | INTRAVENOUS | Status: AC
Start: 2021-12-13 — End: 2021-12-14
  Administered 2021-12-13: 750 mg via INTRAVENOUS
  Filled 2021-12-13: qty 150

## 2021-12-13 MED ORDER — VANCOMYCIN HCL 750 MG/150ML IV SOLN
750.0000 mg | INTRAVENOUS | Status: DC
  Filled 2021-12-13: qty 150

## 2021-12-13 MED ORDER — HEPARIN SODIUM (PORCINE) 1000 UNIT/ML IJ SOLN
INTRAMUSCULAR | Status: AC
  Administered 2021-12-13: 1000 [IU]
  Filled 2021-12-13: qty 5

## 2021-12-13 MED ORDER — CEFAZOLIN SODIUM-DEXTROSE 1-4 GM/50ML-% IV SOLN
1.0000 g | Freq: Every day | INTRAVENOUS | Status: DC
Start: 2021-12-14 — End: 2021-12-19
  Administered 2021-12-14 – 2021-12-18 (×5): 1 g via INTRAVENOUS
  Filled 2021-12-13 (×7): qty 50

## 2021-12-13 MED ORDER — ONDANSETRON HCL 4 MG/2ML IJ SOLN
INTRAMUSCULAR | Status: AC
  Administered 2021-12-13: 4 mg via INTRAVENOUS
  Filled 2021-12-13: qty 2

## 2021-12-13 NOTE — Consult Note (Signed)
NAME: Katie [name]  DOB: 1955/09/01  MRN: [id number]  Date/Time: 12/13/2021 12:58 PM  REQUESTING PROVIDER: Izetta Dakin Subjective:  REASON FOR CONSULT: cellulitis left forearm- recurrent ? Katie [name] is a 66 y.o. with a history of HTN, ESRD Multiple hospitalization for cellulitis left forearm since May 2013, multiple ED visits for hypotension following dialysis Presents with swelling left forearm with redness and pain of few days duration  Pt had Left upper arm brachial artery to axillary vein arteriovenous graft placed on 03/16/21 Pt had a dog bite to her left middle finger first week in may and presented to the ED on 09/23/21 with cellulitis and infection and after initial IV was transitioned to PO augment and was discharged -09/27/21 for 7 days She was brought back to the ED on 09/30/21 as her friend with whom she was staying did not want her. Pt was homeless. She was in the ED waiting for placement until  10/23/21 when she had a temp of 100.4 and was admitted by the hospitalist for worsening infeciton of left forearm /hand I had seen her on 10/24/21 and found the left middle finger site of the bite had healed completely- There was minimal edema of the left forearm but no evidence of infection. Differential diagnosis of fever was healthcare associated pneumonia versus HD catheter  VS left shoulder septic arthritis.CT scan of the left shoulder has ruled out septic arthritis. Blood cultures negative- we gave vanco /cefepime for 7 days  She was seen by ortho Dr.Poggi for left hand pain and stiffness and he got an MRI on 6/14 and there was no evidence of septic arthritis or osteo. There were no fluid collections. So no surgery was needed The left shoulder pain was due to calcific tendinopathy As no fever after 48 hrs of stopping antibiotic I signed off on 10/31/21. I noted that the left arm edema was due to AVG which had not been accessed yet. Pt stayed in the hospital for 54 days due to disposition reason  and was eventually discharged on 11/27/21 to assisted living  She presented to the ED on 12/11/21 with worsening pain/swelling and erythema of the left forearm She had not received dialysis since 11/30/21 as well. She had no fever or chills Vitals in the ED  12/11/21  BP 179/81 !  Temp 98 F (36.7 C)  Pulse Rate 74  Resp 17  SpO2 99 %    Labs in the ED  Latest Reference Range & Units 12/11/21  WBC 4.0 - 10.5 K/uL 6.4  Hemoglobin 12.0 - 15.0 g/dL 10.0 (L)  HCT 36.0 - 46.0 % 32.8 (L)  Platelets 150 - 400 K/uL 219  Creatinine 0.44 - 1.00 mg/dL 6.81 (H)   CXR - bibasilar atelectasis  Past Medical History:  Diagnosis Date   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[place])    Hypertension    Renal disorder    Type 2 diabetes mellitus ([place])     Past Surgical History:  Procedure Laterality Date   APPENDECTOMY     AV FISTULA PLACEMENT Left 03/16/2021   Procedure: INSERTION OF ARTERIOVENOUS (AV) GORE-TEX GRAFT ARM;  Surgeon: Algernon Huxley, MD;  Location: [facility];  Service: Vascular;  Laterality: Left;   DIALYSIS/PERMA CATHETER INSERTION N/A 05/16/2020   Procedure: DIALYSIS/PERMA CATHETER INSERTION;  Surgeon: Algernon Huxley, MD;  Location: [place] [facility];  Service: Cardiovascular;  Laterality: N/A;   MANDIBLE SURGERY     RENAL BIOPSY Right    RIGHT  HEART CATH N/A 05/10/2020   Procedure: RIGHT HEART CATH;  Surgeon: Nelva Bush, MD;  Location: [place] [facility];  Service: Cardiovascular;  Laterality: N/A;   TEMPORARY DIALYSIS CATHETER N/A 05/11/2020   Procedure: TEMPORARY DIALYSIS CATHETER;  Surgeon: Algernon Huxley, MD;  Location: [place] [facility];  Service: Cardiovascular;  Laterality: N/A;    Social History   Socioeconomic History   Marital status: Single    Spouse name: Not on file   Number of children: Not on file   Years of education: Not on file   Highest education level: Not on file  Occupational History   Not on file  Tobacco Use   Smoking status: Former     Types: Cigarettes   Smokeless tobacco: Never  Vaping Use   Vaping Use: Never used  Substance and Sexual Activity   Alcohol use: Not Currently   Drug use: Never   Sexual activity: Not on file  Other Topics Concern   Not on file  Social History Narrative   Not on file   Social Determinants of Health   Financial Resource Strain: Not on file  Food Insecurity: Not on file  Transportation Needs: Not on file  Physical Activity: Not on file  Stress: Not on file  Social Connections: Not on file  Intimate Partner Violence: Not on file    Family History  Problem Relation Age of Onset   Heart attack Mother    Asthma Mother    Uterine cancer Mother    Skin cancer Father    Prostate cancer Father    CAD Father    Parkinson's disease Father    Lung cancer Brother    Allergies  Allergen Reactions   Penicillins Nausea And Vomiting    Pt states "only pills" make her vomit Other reaction(s): Nausea/Vomit   I? Current Facility-Administered Medications  Medication Dose Route Frequency Provider Last Rate Last Admin   acetaminophen (TYLENOL) tablet 650 mg  650 mg Oral Q6H PRN Ivor Costa, MD   650 mg at 12/11/21 1458   carvedilol (COREG) tablet 6.25 mg  6.25 mg Oral QPM Ivor Costa, MD   6.25 mg at 12/12/21 1800   cefTRIAXone (ROCEPHIN) 2 g in sodium chloride 0.9 % 100 mL IVPB  2 g Intravenous Q24H Ralene Muskrat B, MD 200 mL/hr at 12/13/21 0927 2 g at 12/13/21 9622   Chlorhexidine Gluconate Cloth 2 % PADS 6 each  6 each Topical Q0600 Colon Flattery, NP   6 each at 12/13/21 0516   citalopram (CELEXA) tablet 10 mg  10 mg Oral Daily Ivor Costa, MD   10 mg at 12/13/21 0914   diphenhydrAMINE (BENADRYL) injection 25 mg  25 mg Intravenous Q dialysis Colon Flattery, NP   25 mg at 12/13/21 1142   furosemide (LASIX) tablet 40 mg  40 mg Oral Daily Ivor Costa, MD   40 mg at 12/13/21 0914   heparin injection 5,000 Units  5,000 Units Subcutaneous Cleophas Dunker, MD   5,000 Units at 12/13/21  0516   hydrALAZINE (APRESOLINE) injection 5 mg  5 mg Intravenous Q2H PRN Ivor Costa, MD       HYDROmorphone (DILAUDID) injection 0.5 mg  0.5 mg Intravenous Q4H PRN Ivor Costa, MD   0.5 mg at 12/12/21 1818   metroNIDAZOLE (FLAGYL) IVPB 500 mg  500 mg Intravenous Q12H Ivor Costa, MD 100 mL/hr at 12/13/21 0920 500 mg at 12/13/21 0920   ondansetron (ZOFRAN) injection 4 mg  4 mg Intravenous  Q8H PRN Ivor Costa, MD   4 mg at 12/12/21 0919   ondansetron (ZOFRAN) injection 4 mg  4 mg Intravenous Q dialysis Colon Flattery, NP   4 mg at 12/13/21 1140   oxyCODONE-acetaminophen (PERCOCET/ROXICET) 5-325 MG per tablet 1 tablet  1 tablet Oral Q8H PRN Ivor Costa, MD   1 tablet at 12/13/21 0914   pantoprazole (PROTONIX) EC tablet 40 mg  40 mg Oral BID Ivor Costa, MD   40 mg at 12/13/21 0914   senna-docusate (Senokot-S) tablet 1 tablet  1 tablet Oral BID PRN Ivor Costa, MD       tiZANidine (ZANAFLEX) tablet 4 mg  4 mg Oral BID PRN Ivor Costa, MD   4 mg at 12/11/21 2214   topiramate (TOPAMAX) tablet 50 mg  50 mg Oral BID PRN Ivor Costa, MD       traZODone (DESYREL) tablet 50 mg  50 mg Oral QHS Ivor Costa, MD   50 mg at 12/12/21 2245   vancomycin (VANCOREADY) IVPB 750 mg/150 mL  750 mg Intravenous Once Dorothe Pea, [place]       [START ON 12/16/2021] vancomycin (VANCOREADY) IVPB 750 mg/150 mL  750 mg Intravenous Q T,Th,Sa-HD Virl Cagey E, RPH         Abtx:  Anti-infectives (From admission, onward)    Start     Dose/Rate Route Frequency Ordered Stop   12/16/21 1200  vancomycin (VANCOREADY) IVPB 750 mg/150 mL        750 mg 150 mL/hr over 60 Minutes Intravenous Every T-Th-Sa (Hemodialysis) 12/13/21 1235     12/13/21 1300  vancomycin (VANCOREADY) IVPB 750 mg/150 mL        750 mg 150 mL/hr over 60 Minutes Intravenous  Once 12/13/21 1235     12/13/21 1000  cefTRIAXone (ROCEPHIN) 2 g in sodium chloride 0.9 % 100 mL IVPB        2 g 200 mL/hr over 30 Minutes Intravenous Every 24 hours 12/12/21 1502     12/12/21  1200  cefTRIAXone (ROCEPHIN) 1 g in sodium chloride 0.9 % 100 mL IVPB  Status:  Discontinued        1 g 200 mL/hr over 30 Minutes Intravenous Every 24 hours 12/11/21 1424 12/12/21 1502   12/12/21 1200  vancomycin (VANCOREADY) IVPB 750 mg/150 mL  Status:  Discontinued        750 mg 150 mL/hr over 60 Minutes Intravenous Every T-Th-Sa (Hemodialysis) 12/11/21 1428 12/13/21 1235   12/11/21 2200  metroNIDAZOLE (FLAGYL) IVPB 500 mg        500 mg 100 mL/hr over 60 Minutes Intravenous Every 12 hours 12/11/21 1424     12/11/21 1430  cefTRIAXone (ROCEPHIN) 1 g in sodium chloride 0.9 % 100 mL IVPB  Status:  Discontinued        1 g 200 mL/hr over 30 Minutes Intravenous Every 24 hours 12/11/21 1423 12/11/21 1424   12/11/21 1130  ceFEPIme (MAXIPIME) 2 g in sodium chloride 0.9 % 100 mL IVPB        2 g 200 mL/hr over 30 Minutes Intravenous  Once 12/11/21 1118 12/11/21 1529   12/11/21 1130  vancomycin (VANCOREADY) IVPB 1500 mg/300 mL        1,500 mg 150 mL/hr over 120 Minutes Intravenous  Once 12/11/21 1118 12/11/21 1845   12/11/21 1045  metroNIDAZOLE (FLAGYL) IVPB 500 mg        500 mg 100 mL/hr over 60 Minutes Intravenous  Once 12/11/21 1032 12/11/21 1200  REVIEW OF SYSTEMS:  Const: negative fever, negative chills, negative weight loss Eyes: negative diplopia or visual changes, negative eye pain ENT: negative coryza, negative sore throat Resp: negative cough, hemoptysis, dyspnea Cards: negative for chest pain, palpitations, lower extremity edema GU: negative for frequency, dysuria and hematuria GI: Negative for abdominal pain, diarrhea, bleeding, constipation Skin: negative for rash and pruritus Heme: negative for easy bruising and gum/nose bleeding MS: weakness Neurolo:negative for headaches, dizziness, vertigo, memory problems  Psych:  anxiety, depression  Endocrine:  diabetes Allergy/Immunology- PCN- side effect than allergy: Objective:  VITALS:  BP (!) 145/65 (BP Location: Right  Arm)   Pulse 70   Temp 99 F (37.2 C) (Oral)   Resp 12   Ht 5\' 6"  (1.676 m)   Wt 76.2 kg   SpO2 100%   BMI 27.11 kg/m  LDA Rt IJ HD cath PHYSICAL EXAM:  General: Alert, cooperative, no distress, appears stated age.  Head: Normocephalic, without obvious abnormality, atraumatic. Eyes: Conjunctivae clear, anicteric sclerae. Pupils are equal ENT Nares normal. No drainage or sinus tenderness. Lips, mucosa, and tongue normal. No Thrush Neck: Supple, symmetrical, no adenopathy, thyroid: non tender no carotid bruit and no JVD. Back: No CVA tenderness. Lungs: Clear to auscultation bilaterally. No Wheezing or Rhonchi. No rales. Heart: Regular rate and rhythm, no murmur, rub or gallop. Abdomen: Soft, non-tender,not distended. Bowel sounds normal. No masses Extremities:left forearm swollen and erythematous     Skin: No rashes or lesions. Or bruising Lymph: Cervical, supraclavicular normal. Neurologic: Grossly non-focal Pertinent Labs Lab Results CBC    Component Value Date/Time   WBC 6.4 12/11/2021 1021   RBC 3.35 (L) 12/11/2021 1021   HGB 10.0 (L) 12/11/2021 1021   HCT 32.8 (L) 12/11/2021 1021   PLT 219 12/11/2021 1021   MCV 97.9 12/11/2021 1021   MCH 29.9 12/11/2021 1021   MCHC 30.5 12/11/2021 1021   RDW 16.2 (H) 12/11/2021 1021   LYMPHSABS 1.7 12/11/2021 1021   MONOABS 0.7 12/11/2021 1021   EOSABS 0.1 12/11/2021 1021   BASOSABS 0.0 12/11/2021 1021       Latest Ref Rng & Units 12/12/2021    4:39 AM 12/11/2021   10:21 AM 11/25/2021    8:00 AM  CMP  Glucose 70 - 99 mg/dL 93  157  105   BUN 8 - 23 mg/dL 44  38  73   Creatinine 0.44 - 1.00 mg/dL 6.79  6.81  7.21   Sodium 135 - 145 mmol/L 137  139  135   Potassium 3.5 - 5.1 mmol/L 4.5  4.5  4.4   Chloride 98 - 111 mmol/L 103  103  102   CO2 22 - 32 mmol/L 24  26  25    Calcium 8.9 - 10.3 mg/dL 8.3  8.5  8.6   Total Protein 6.5 - 8.1 g/dL  5.9  6.0   Total Bilirubin 0.3 - 1.2 mg/dL  0.7  0.6   Alkaline Phos 38 - 126 U/L   83  84   AST 15 - 41 U/L  20  19   ALT 0 - 44 U/L  17  15       Microbiology: Recent Results (from the past 240 hour(s))  Blood Culture (routine x 2)     Status: None (Preliminary result)   Collection Time: 12/11/21 10:21 AM   Specimen: BLOOD  Result Value Ref Range Status   Specimen Description BLOOD LEFT [facility]  Final   Special Requests   Final  BOTTLES DRAWN AEROBIC AND ANAEROBIC Blood Culture adequate volume   Culture   Final    NO GROWTH 2 DAYS Performed at [facility], [address] [place], [place] [address]    Report Status PENDING  Incomplete  Blood Culture (routine x 2)     Status: None (Preliminary result)   Collection Time: 12/11/21 10:21 AM   Specimen: BLOOD  Result Value Ref Range Status   Specimen Description BLOOD RIGHT ARM  Final   Special Requests   Final    BOTTLES DRAWN AEROBIC AND ANAEROBIC Blood Culture results may not be optimal due to an excessive volume of blood received in culture bottles   Culture   Final    NO GROWTH 2 DAYS Performed at [facility], [address], [place], [place] [address]    Report Status PENDING  Incomplete  Urine Culture     Status: Abnormal (Preliminary result)   Collection Time: 12/11/21  5:36 PM   Specimen: Urine, Random  Result Value Ref Range Status   Specimen Description   Final    URINE, RANDOM Performed at [facility], [address] [place], [place] [address]    Special Requests   Final    NONE Performed at [facility], [address], [place], [place] [address]    Culture (A)  Final    >=100,000 COLONIES/mL Lonell Grandchild NEGATIVE RODS SUSCEPTIBILITIES TO FOLLOW Performed at [facility], [place] [address] [place], [place] [address]    Report Status PENDING  Incomplete    IMAGING RESULTS: MRI - of the left arm- no abscess I have personally reviewed the films ? Impression/Recommendation ? Recurrent cellulitis of left forearm She had a dog bite in may 2013 to  the left finger and that was treated and completely resolved But now she has cellulitis recurrent X 3 rd episode in 3 months Unclear why the cellulitis is recurrent- she likely has underlying edema of the arm She also has a left AVG and that has not been used at all I think that is the cause of tthe edema and  cellulitis- Doppler did not show any thrombus Need to interrogate the AVG No open wounds She is currently on Vanco, ceftriaxone and flagyl Change to cefazolin Keep the arm elevated  ESRD on dialysis  HTN on coreg  Anemia due to ESRD   ___________________________________________________ Discussed with patient, and care team  Note:  This document was prepared using Dragon voice recognition software and may include unintentional dictation errors.

## 2021-12-13 NOTE — Plan of Care (Signed)
  Problem: Education: Goal: Knowledge of General Education information will improve Description: Including pain rating scale, medication(s)/side effects and non-pharmacologic comfort measures 12/13/2021 1454 by Berlinda Farve Bet, LPN Outcome: Progressing 12/13/2021 1454 by Yoselyn Mcglade Bet, LPN Outcome: Progressing   Problem: Clinical Measurements: Goal: Ability to maintain clinical measurements within normal limits will improve 12/13/2021 1454 by Rowynn Mcweeney Bet, LPN Outcome: Progressing 12/13/2021 1454 by Tobechukwu Emmick Bet, LPN Outcome: Progressing   Problem: Activity: Goal: Risk for activity intolerance will decrease 12/13/2021 1454 by Cherry Wittwer Bet, LPN Outcome: Progressing 12/13/2021 1454 by Elizabet Schweppe Bet, LPN Outcome: Progressing   Problem: Nutrition: Goal: Adequate nutrition will be maintained 12/13/2021 1454 by Kody Vigil Bet, LPN Outcome: Progressing 12/13/2021 1454 by Naje Rice Bet, LPN Outcome: Progressing   Problem: Pain Managment: Goal: General experience of comfort will improve 12/13/2021 1454 by Sahiba Granholm Bet, LPN Outcome: Progressing 12/13/2021 1454 by Audreana Hancox Bet, LPN Outcome: Progressing   Problem: Elimination: Goal: Will not experience complications related to bowel motility 12/13/2021 1454 by Antanette Richwine Bet, LPN Outcome: Progressing 12/13/2021 1454 by Arriona Prest Bet, LPN Outcome: Progressing   Problem: Safety: Goal: Ability to remain free from injury will improve 12/13/2021 1454 by Jhane Lorio Bet, LPN Outcome: Progressing 12/13/2021 1454 by Morgane Joerger Bet, LPN Outcome: Progressing   Problem: Skin Integrity: Goal: Risk for impaired skin integrity will decrease 12/13/2021 1454 by Bell Carbo Bet, LPN Outcome: Progressing 12/13/2021 1454 by Marilu Rylander Bet, LPN Outcome: Progressing

## 2021-12-13 NOTE — Progress Notes (Signed)
Pt refused to let nursing staff obtain blood glucose check. Notified dialysis NP and attending MD.

## 2021-12-13 NOTE — Progress Notes (Signed)
Received patient in bed to unit.  Alert and oriented.  Informed consent signed and in  chart.   Treatment initiated: 1137 Treatment completed: 1453  Patient tolerated well.  Transported back to the room  alert, without acute distress.  Hand-off given to patient's nurse.   Access used: catheter Access issues: none  Total UF removed: 500cc Medication(s) given: zofran, benadryl, Vanc Post HD VS: 169/76 (104), HR-72, RR-14, SP02-100, Temp-98.7 Post HD weight: 76.2kg   [facility]

## 2021-12-13 NOTE — Progress Notes (Signed)
Patient refused treatment priorly this AM, reattempt to treat patient.

## 2021-12-13 NOTE — Progress Notes (Signed)
[facility]  ROUNDING NOTE   Subjective:   Katie [name] is a 66 year old female with past medical history of ESRD with hemodialysis TTS, diabetes mellitus, and hypertension. She presents to ED with worsening left lower arm swelling and redness. Patient has been admitted for ESRD (end stage renal disease) on dialysis ([place]) [N18.6, Z99.2] Cellulitis of left upper extremity [L03.114] Left arm cellulitis [I69.629]  Patient is known to our practice and receives outpatient dialysis treatments at [facility] on a TTS schedule, supervised by Dr. Holley Raring.  Patient missed treatment on Saturday due to transportation issues.    Patient seen sitting up in bed, currently eating breakfast Alert and oriented Agitated due to glucose checks and scheduled dialysis today Currently states she will not go to dialysis today due to just receiving her treatment yesterday. States she is not diabetic and does not want to have blood sugar checked frequently   Objective:  Vital signs in last 24 hours:  Temp:  [98 F (36.7 C)-99.6 F (37.6 C)] 99 F (37.2 C) (08/02 1124) Pulse Rate:  [66-81] 71 (08/02 1200) Resp:  [10-19] 14 (08/02 1200) BP: (100-184)/(53-110) 184/83 (08/02 1200) SpO2:  [93 %-100 %] 100 % (08/02 1200) Weight:  [73.8 kg-76.2 kg] 76.2 kg (08/02 1137)  Weight change: -2.309 kg Filed Weights   12/12/21 1337 12/13/21 0438 12/13/21 1137  Weight: 74.3 kg 73.8 kg 76.2 kg    Intake/Output: I/O last 3 completed shifts: In: 480 [P.O.:480] Out: 1900 [Urine:400; Other:1500]   Intake/Output this shift:  No intake/output data recorded.  Physical Exam: General: NAD  Head: Normocephalic, atraumatic. Moist oral mucosal membranes  Eyes: Anicteric  Lungs:  Clear to auscultation, normal effort, room air  Heart: Regular rate and rhythm  Abdomen:  Soft, nontender  Extremities: 1+ peripheral edema.  Left upper extremity 2+ edema  Neurologic: Nonfocal, moving all four extremities   Skin: No lesions left upper extremity erythema  Access: Right chest PermCath    Basic Metabolic Panel: Recent Labs  Lab 12/11/21 1021 12/12/21 0439  NA 139 137  K 4.5 4.5  CL 103 103  CO2 26 24  GLUCOSE 157* 93  BUN 38* 44*  CREATININE 6.81* 6.79*  CALCIUM 8.5* 8.3*  PHOS  --  5.6*     Liver Function Tests: Recent Labs  Lab 12/11/21 1021  AST 20  ALT 17  ALKPHOS 83  BILITOT 0.7  PROT 5.9*  ALBUMIN 2.8*    No results for input(s): "LIPASE", "AMYLASE" in the last 168 hours. No results for input(s): "AMMONIA" in the last 168 hours.  CBC: Recent Labs  Lab 12/11/21 1021  WBC 6.4  NEUTROABS 3.8  HGB 10.0*  HCT 32.8*  MCV 97.9  PLT 219     Cardiac Enzymes: No results for input(s): "CKTOTAL", "CKMB", "CKMBINDEX", "TROPONINI" in the last 168 hours.  BNP: Invalid input(s): "POCBNP"  CBG: Recent Labs  Lab 12/13/21 0905  GLUCAP 104*    Microbiology: Results for orders placed or performed during the hospital encounter of 12/11/21  Blood Culture (routine x 2)     Status: None (Preliminary result)   Collection Time: 12/11/21 10:21 AM   Specimen: BLOOD  Result Value Ref Range Status   Specimen Description BLOOD LEFT [facility]  Final   Special Requests   Final    BOTTLES DRAWN AEROBIC AND ANAEROBIC Blood Culture adequate volume   Culture   Final    NO GROWTH 2 DAYS Performed at [facility], [place],  [place], [place] [address]    Report Status PENDING  Incomplete  Blood Culture (routine x 2)     Status: None (Preliminary result)   Collection Time: 12/11/21 10:21 AM   Specimen: BLOOD  Result Value Ref Range Status   Specimen Description BLOOD RIGHT ARM  Final   Special Requests   Final    BOTTLES DRAWN AEROBIC AND ANAEROBIC Blood Culture results may not be optimal due to an excessive volume of blood received in culture bottles   Culture   Final    NO GROWTH 2 DAYS Performed at [facility], [address], [place], [place]  [address]    Report Status PENDING  Incomplete  Urine Culture     Status: Abnormal (Preliminary result)   Collection Time: 12/11/21  5:36 PM   Specimen: Urine, Random  Result Value Ref Range Status   Specimen Description   Final    URINE, RANDOM Performed at [facility], [address] [place], [place] [address]    Special Requests   Final    NONE Performed at [facility], [address] [place], [place] [address]    Culture (A)  Final    >=100,000 COLONIES/mL Lonell Grandchild NEGATIVE RODS SUSCEPTIBILITIES TO FOLLOW Performed at [facility], [place] [address], [place], [place] [address]    Report Status PENDING  Incomplete    Coagulation Studies: Recent Labs    12/11/21 1021  LABPROT 14.2  INR 1.1     Urinalysis: Recent Labs    12/11/21 1736  COLORURINE YELLOW*  LABSPEC 1.013  PHURINE 8.0  GLUCOSEU NEGATIVE  HGBUR NEGATIVE  BILIRUBINUR NEGATIVE  KETONESUR NEGATIVE  PROTEINUR 100*  NITRITE NEGATIVE  LEUKOCYTESUR LARGE*       Imaging: MR FOREARM LEFT W WO CONTRAST  Result Date: 12/13/2021 CLINICAL DATA:  Soft tissue mass, forearm, deep; Soft tissue mass, upper arm, deep EXAM: MRI OF THE LEFT FOREARM WITHOUT AND WITH CONTRAST; MRI OF THE LEFT HUMERUS WITHOUT AND WITH CONTRAST TECHNIQUE: Multiplanar, multisequence MR imaging of the left forearm and left humerus was performed before and after the administration of intravenous contrast. CONTRAST:  70mL GADAVIST GADOBUTROL 1 MMOL/ML IV SOLN COMPARISON:  None Available. FINDINGS: Bones/Joint/Cartilage There is no significant marrow signal alteration. The cortex is intact. No aggressive bone lesion. There is a trace elbow joint effusion. Muscles and Tendons There is generalized loss of muscle bulk. There is intramuscular edema within the brachialis, brachioradialis, and involving the flexor compartment musculature of the forearm with mild interfascial edema. There is no intramuscular collection. Soft tissues  There is extensive skin thickening and subcutaneous soft tissue swelling of the left upper extremity, extending from the upper arm through the forearm, and worst along the forearm. There is no well-defined/drainable fluid collection. IMPRESSION: Findings are most consistent with left upper extremity cellulitis with myofasciitis in the distal upper arm and flexor compartment of the forearm. No evidence of osteomyelitis or soft tissue abscess. Trace elbow joint effusion without other convincing findings to suggest septic arthritis. Electronically Signed   By: Maurine Simmering M.D.   On: 12/13/2021 08:28   MR HUMERUS LEFT W WO CONTRAST  Result Date: 12/13/2021 CLINICAL DATA:  Soft tissue mass, forearm, deep; Soft tissue mass, upper arm, deep EXAM: MRI OF THE LEFT FOREARM WITHOUT AND WITH CONTRAST; MRI OF THE LEFT HUMERUS WITHOUT AND WITH CONTRAST TECHNIQUE: Multiplanar, multisequence MR imaging of the left forearm and left humerus was performed before and after the administration of intravenous  contrast. CONTRAST:  58mL GADAVIST GADOBUTROL 1 MMOL/ML IV SOLN COMPARISON:  None Available. FINDINGS: Bones/Joint/Cartilage There is no significant marrow signal alteration. The cortex is intact. No aggressive bone lesion. There is a trace elbow joint effusion. Muscles and Tendons There is generalized loss of muscle bulk. There is intramuscular edema within the brachialis, brachioradialis, and involving the flexor compartment musculature of the forearm with mild interfascial edema. There is no intramuscular collection. Soft tissues There is extensive skin thickening and subcutaneous soft tissue swelling of the left upper extremity, extending from the upper arm through the forearm, and worst along the forearm. There is no well-defined/drainable fluid collection. IMPRESSION: Findings are most consistent with left upper extremity cellulitis with myofasciitis in the distal upper arm and flexor compartment of the forearm. No evidence  of osteomyelitis or soft tissue abscess. Trace elbow joint effusion without other convincing findings to suggest septic arthritis. Electronically Signed   By: Maurine Simmering M.D.   On: 12/13/2021 08:28     Medications:    cefTRIAXone (ROCEPHIN)  IV 2 g (12/13/21 4332)   metronidazole 500 mg (12/13/21 0920)   vancomycin Stopped (12/12/21 1417)    carvedilol  6.25 mg Oral QPM   Chlorhexidine Gluconate Cloth  6 each Topical Q0600   citalopram  10 mg Oral Daily   furosemide  40 mg Oral Daily   heparin  5,000 Units Subcutaneous Q8H   pantoprazole  40 mg Oral BID   traZODone  50 mg Oral QHS   acetaminophen, diphenhydrAMINE, hydrALAZINE, HYDROmorphone (DILAUDID) injection, ondansetron (ZOFRAN) IV, ondansetron (ZOFRAN) IV, oxyCODONE-acetaminophen, senna-docusate, tiZANidine, topiramate  Assessment/ Plan:  Ms. Randie Bloodgood is a 66 y.o.  female   with past medical history of ESRD with hemodialysis TTS, diabetes mellitus, and hypertension. She presents to ED with worsening left lower arm swelling and redness. Patient has been admitted for ESRD (end stage renal disease) on dialysis ([place]) [N18.6, Z99.2] Cellulitis of left upper extremity [L03.114] Left arm cellulitis [R51.884]   [facility]/TTS/right PermCath/83.5 kg  End-stage renal disease on hemodialysis.  Will maintain outpatient schedule if possible.  Patient received dialysis yesterday, UF 1.5 L achieved.  Patient will receive additional dialysis treatment today due to MRI with contrast received yesterday.  Patient initially refused however agreed to proceed with treatment today.  We will forego treatment tomorrow, therefore next dialysis treatment will be Saturday.  2. Anemia of chronic kidney disease Lab Results  Component Value Date   HGB 10.0 (L) 12/11/2021    Patient receives [facility] outpatient.  Hemoglobin currently within desired target.  3. Secondary Hyperparathyroidism:  Lab Results  Component Value Date   PTH 70 (H)  05/12/2020   CALCIUM 8.3 (L) 12/12/2021   PHOS 5.6 (H) 12/12/2021    We will continue to monitor bone minerals during this admission.  4.  Hypertension with chronic kidney disease.  Home regimen includes carvedilol and furosemide 40 mg daily.  Currently receiving these inpatient.  Blood pressure 145/65.  5.  Suspected left arm cellulitis evidenced by edema, erythema, warm to touch.  Likely secondary to wound infection.  Evaluation ongoing.  Currently prescribed cefepime and metronidazole.  Will continue Vancomycin with dialysis.   LOS: 2 Reino Lybbert 8/2/202312:26 PM

## 2021-12-13 NOTE — Progress Notes (Signed)
Pt refused dialysis today. Mentioned that she is aware of contrast and will come on her regular day. T,TH,S

## 2021-12-13 NOTE — Progress Notes (Signed)
Patient treatment completed with no complications, [id number] removed and patients cath is heparin locked and capped.

## 2021-12-13 NOTE — Progress Notes (Signed)
Progress Note    Katie [name]  RSW:[id number] DOB: 12/15/1955  DOA: 12/11/2021 PCP: Merryl Hacker, No      Brief Narrative:    Medical records reviewed and are as summarized below:  Katie [name] is a 66 y.o. female with medical history significant for ESRD on hemodialysis, type II DM, hypertension, homelessness, sepsis from pneumonia in June 2022, left upper extremity cellulitis in June 2023, dog bite infection left upper extremity June 2023, chronic disease, aortic stenosis, fecal impaction, recent discharge from the hospital on 11/26/2021.  She has an old left arm fistula packed she has been getting hemodialysis through a right upper chest wall permacath.   She was found to have left upper extremity cellulitis.     Assessment/Plan:   Principal Problem:   Cellulitis of left upper extremity Active Problems:   ESRD (end stage renal disease) ([place])   Hypertension   Anemia in ESRD (end-stage renal disease) (H[facility])   Elevated lactic acid level   Chronic diastolic CHF (congestive heart failure) (H[facility])   Depression, unspecified   Type II diabetes mellitus with renal manifestations (H[facility])    Body mass index is 27.11 kg/m.  Recurrent left upper extremity cellulitis: She was recently treated with 10-day course of doxycycline and Keflex (completed on 11/18/2021) for left upper extremity cellulitis. Continue empiric IV antibiotics.  ID has been consulted because of recurrent cellulitis.  ESRD: Follow-up with nephrologist for hemodialysis.  Chronic diastolic CHF: Compensated  Lactic acidosis: Resolved  Other comorbidities include depression, hypertension,  Diet Order             Diet renal with fluid restriction Fluid restriction: 1200 mL Fluid; Room service appropriate? Yes; Fluid consistency: Thin  Diet effective now                            Consultants: Nephrologist, ID specialist  Procedures: None    Medications:    carvedilol  6.25 mg Oral QPM    Chlorhexidine Gluconate Cloth  6 each Topical Q0600   citalopram  10 mg Oral Daily   furosemide  40 mg Oral Daily   heparin  5,000 Units Subcutaneous Q8H   pantoprazole  40 mg Oral BID   traZODone  50 mg Oral QHS   Continuous Infusions:  cefTRIAXone (ROCEPHIN)  IV 2 g (12/13/21 0927)   metronidazole 500 mg (12/13/21 0920)   vancomycin 750 mg (12/13/21 1341)   [START ON 12/16/2021] vancomycin       Anti-infectives (From admission, onward)    Start     Dose/Rate Route Frequency Ordered Stop   12/16/21 1200  vancomycin (VANCOREADY) IVPB 750 mg/150 mL        750 mg 150 mL/hr over 60 Minutes Intravenous Every T-Th-Sa (Hemodialysis) 12/13/21 1235     12/13/21 1300  vancomycin (VANCOREADY) IVPB 750 mg/150 mL        750 mg 150 mL/hr over 60 Minutes Intravenous  Once 12/13/21 1235     12/13/21 1000  cefTRIAXone (ROCEPHIN) 2 g in sodium chloride 0.9 % 100 mL IVPB        2 g 200 mL/hr over 30 Minutes Intravenous Every 24 hours 12/12/21 1502     12/12/21 1200  cefTRIAXone (ROCEPHIN) 1 g in sodium chloride 0.9 % 100 mL IVPB  Status:  Discontinued        1 g 200 mL/hr over 30 Minutes Intravenous Every 24 hours 12/11/21 1424 12/12/21 1502  12/12/21 1200  vancomycin (VANCOREADY) IVPB 750 mg/150 mL  Status:  Discontinued        750 mg 150 mL/hr over 60 Minutes Intravenous Every T-Th-Sa (Hemodialysis) 12/11/21 1428 12/13/21 1235   12/11/21 2200  metroNIDAZOLE (FLAGYL) IVPB 500 mg        500 mg 100 mL/hr over 60 Minutes Intravenous Every 12 hours 12/11/21 1424     12/11/21 1430  cefTRIAXone (ROCEPHIN) 1 g in sodium chloride 0.9 % 100 mL IVPB  Status:  Discontinued        1 g 200 mL/hr over 30 Minutes Intravenous Every 24 hours 12/11/21 1423 12/11/21 1424   12/11/21 1130  ceFEPIme (MAXIPIME) 2 g in sodium chloride 0.9 % 100 mL IVPB        2 g 200 mL/hr over 30 Minutes Intravenous  Once 12/11/21 1118 12/11/21 1529   12/11/21 1130  vancomycin (VANCOREADY) IVPB 1500 mg/300 mL        1,500 mg 150  mL/hr over 120 Minutes Intravenous  Once 12/11/21 1118 12/11/21 1845   12/11/21 1045  metroNIDAZOLE (FLAGYL) IVPB 500 mg        500 mg 100 mL/hr over 60 Minutes Intravenous  Once 12/11/21 1032 12/11/21 1200              Family Communication/Anticipated D/C date and plan/Code Status   DVT prophylaxis: heparin injection 5,000 Units Start: 12/11/21 1400     Code Status: Full Code  Family Communication: None Disposition Plan: Plan to discharge home in 2 to 3 days   Status is: Inpatient Remains inpatient appropriate because: IV antibiotics for left upper extremity cellulitis      Subjective:   Interval events noted.  She complains of pain in the left forearm  Objective:    Vitals:   12/13/21 1230 12/13/21 1300 12/13/21 1330 12/13/21 1400  BP: (!) 145/65 137/63 (!) 141/69 (!) 167/139  Pulse: 70 69 72 72  Resp: 12 10 15 15   Temp:      TempSrc:      SpO2: 100% 98% 100% 100%  Weight:      Height:       No data found.   Intake/Output Summary (Last 24 hours) at 12/13/2021 1419 Last data filed at 12/13/2021 0600 Gross per 24 hour  Intake --  Output 400 ml  Net -400 ml   Filed Weights   12/12/21 1337 12/13/21 0438 12/13/21 1137  Weight: 74.3 kg 73.8 kg 76.2 kg    Exam:  GEN: NAD SKIN: Warm and dry EYES: No pallor or icterus ENT: MMM CV: RRR PULM: CTA B ABD: soft, obese, NT, +BS CNS: AAO x 3, non focal EXT: Swelling, erythema and tenderness of the left forearm, mild tenderness of the left arm.       Data Reviewed:   I have personally reviewed following labs and imaging studies:  Labs: Labs show the following:   Basic Metabolic Panel: Recent Labs  Lab 12/11/21 1021 12/12/21 0439  NA 139 137  K 4.5 4.5  CL 103 103  CO2 26 24  GLUCOSE 157* 93  BUN 38* 44*  CREATININE 6.81* 6.79*  CALCIUM 8.5* 8.3*  PHOS  --  5.6*   GFR Estimated Creatinine Clearance: 8.8 mL/min (A) (by C-G formula based on SCr of 6.79 mg/dL (H)). Liver Function  Tests: Recent Labs  Lab 12/11/21 1021  AST 20  ALT 17  ALKPHOS 83  BILITOT 0.7  PROT 5.9*  ALBUMIN 2.8*   No  results for input(s): "LIPASE", "AMYLASE" in the last 168 hours. No results for input(s): "AMMONIA" in the last 168 hours. Coagulation profile Recent Labs  Lab 12/11/21 1021  INR 1.1    CBC: Recent Labs  Lab 12/11/21 1021  WBC 6.4  NEUTROABS 3.8  HGB 10.0*  HCT 32.8*  MCV 97.9  PLT 219   Cardiac Enzymes: No results for input(s): "CKTOTAL", "CKMB", "CKMBINDEX", "TROPONINI" in the last 168 hours. BNP (last 3 results) No results for input(s): "PROBNP" in the last 8760 hours. CBG: Recent Labs  Lab 12/13/21 0905  GLUCAP 104*   D-Dimer: No results for input(s): "DDIMER" in the last 72 hours. Hgb A1c: No results for input(s): "HGBA1C" in the last 72 hours. Lipid Profile: No results for input(s): "CHOL", "HDL", "LDLCALC", "TRIG", "CHOLHDL", "LDLDIRECT" in the last 72 hours. Thyroid function studies: No results for input(s): "TSH", "T4TOTAL", "T3FREE", "THYROIDAB" in the last 72 hours.  Invalid input(s): "FREET3" Anemia work up: No results for input(s): "VITAMINB12", "FOLATE", "FERRITIN", "TIBC", "IRON", "RETICCTPCT" in the last 72 hours. Sepsis Labs: Recent Labs  Lab 12/11/21 1021 12/11/21 1943  PROCALCITON 0.14  --   WBC 6.4  --   LATICACIDVEN 2.2* 0.9    Microbiology Recent Results (from the past 240 hour(s))  Blood Culture (routine x 2)     Status: None (Preliminary result)   Collection Time: 12/11/21 10:21 AM   Specimen: BLOOD  Result Value Ref Range Status   Specimen Description BLOOD LEFT [facility]  Final   Special Requests   Final    BOTTLES DRAWN AEROBIC AND ANAEROBIC Blood Culture adequate volume   Culture   Final    NO GROWTH 2 DAYS Performed at [facility], [address], [place], [place] [address]    Report Status PENDING  Incomplete  Blood Culture (routine x 2)     Status: None (Preliminary result)   Collection Time:  12/11/21 10:21 AM   Specimen: BLOOD  Result Value Ref Range Status   Specimen Description BLOOD RIGHT ARM  Final   Special Requests   Final    BOTTLES DRAWN AEROBIC AND ANAEROBIC Blood Culture results may not be optimal due to an excessive volume of blood received in culture bottles   Culture   Final    NO GROWTH 2 DAYS Performed at [facility], [address] [place], [place] [address]    Report Status PENDING  Incomplete  Urine Culture     Status: Abnormal (Preliminary result)   Collection Time: 12/11/21  5:36 PM   Specimen: Urine, Random  Result Value Ref Range Status   Specimen Description   Final    URINE, RANDOM Performed at [facility], [address] [place], [place] [address]    Special Requests   Final    NONE Performed at [facility] [place], [address] [place], [place] [address]    Culture (A)  Final    >=100,000 COLONIES/mL PROTEUS MIRABILIS SUSCEPTIBILITIES TO FOLLOW Performed at [facility], [place] [address], [place],  [address]    Report Status PENDING  Incomplete    Procedures and diagnostic studies:  MR FOREARM LEFT W WO CONTRAST  Result Date: 12/13/2021 CLINICAL DATA:  Soft tissue mass, forearm, deep; Soft tissue mass, upper arm, deep EXAM: MRI OF THE LEFT FOREARM WITHOUT AND WITH CONTRAST; MRI OF THE LEFT HUMERUS WITHOUT AND WITH CONTRAST TECHNIQUE: Multiplanar, multisequence MR imaging of the left forearm and left humerus was performed before and after  the administration of intravenous contrast. CONTRAST:  83mL GADAVIST GADOBUTROL 1 MMOL/ML IV SOLN COMPARISON:  None Available. FINDINGS: Bones/Joint/Cartilage There is no significant marrow signal alteration. The cortex is intact. No aggressive bone lesion. There is a trace elbow joint effusion. Muscles and Tendons There is generalized loss of muscle bulk. There is intramuscular edema within the brachialis, brachioradialis, and involving the flexor compartment musculature  of the forearm with mild interfascial edema. There is no intramuscular collection. Soft tissues There is extensive skin thickening and subcutaneous soft tissue swelling of the left upper extremity, extending from the upper arm through the forearm, and worst along the forearm. There is no well-defined/drainable fluid collection. IMPRESSION: Findings are most consistent with left upper extremity cellulitis with myofasciitis in the distal upper arm and flexor compartment of the forearm. No evidence of osteomyelitis or soft tissue abscess. Trace elbow joint effusion without other convincing findings to suggest septic arthritis. Electronically Signed   By: Maurine Simmering M.D.   On: 12/13/2021 08:28   MR HUMERUS LEFT W WO CONTRAST  Result Date: 12/13/2021 CLINICAL DATA:  Soft tissue mass, forearm, deep; Soft tissue mass, upper arm, deep EXAM: MRI OF THE LEFT FOREARM WITHOUT AND WITH CONTRAST; MRI OF THE LEFT HUMERUS WITHOUT AND WITH CONTRAST TECHNIQUE: Multiplanar, multisequence MR imaging of the left forearm and left humerus was performed before and after the administration of intravenous contrast. CONTRAST:  43mL GADAVIST GADOBUTROL 1 MMOL/ML IV SOLN COMPARISON:  None Available. FINDINGS: Bones/Joint/Cartilage There is no significant marrow signal alteration. The cortex is intact. No aggressive bone lesion. There is a trace elbow joint effusion. Muscles and Tendons There is generalized loss of muscle bulk. There is intramuscular edema within the brachialis, brachioradialis, and involving the flexor compartment musculature of the forearm with mild interfascial edema. There is no intramuscular collection. Soft tissues There is extensive skin thickening and subcutaneous soft tissue swelling of the left upper extremity, extending from the upper arm through the forearm, and worst along the forearm. There is no well-defined/drainable fluid collection. IMPRESSION: Findings are most consistent with left upper extremity  cellulitis with myofasciitis in the distal upper arm and flexor compartment of the forearm. No evidence of osteomyelitis or soft tissue abscess. Trace elbow joint effusion without other convincing findings to suggest septic arthritis. Electronically Signed   By: Maurine Simmering M.D.   On: 12/13/2021 08:28               LOS: 2 days   Katie [name]  [facility]   Pager on [email or web address]. If 7PM-7AM, please contact night-coverage at [email or web address]     12/13/2021, 2:19 PM

## 2021-12-13 NOTE — Progress Notes (Addendum)
Pharmacy Antibiotic Note  Katie [name] is a 66 y.o. female admitted on 12/11/2021 with  wound infection . Patient recently completed 2 weeks of doxycycline after being bitten on left forearm about one month ago. Patient endorses worsening pain the past several days with swelling, erythema on left forearm. Pharmacy has been consulted for Vancomycin dosing. Patient is ESRD on dialysis every Tue/Thur/Sat schedule.  Assessment: 7/31: MRI shows no evidence of DVT 8/1: Afebrile. WBC and vital signs WNL. 8/2: Day #3 of abx. Afebrile. WBC and vital signs WNL. Patient went for additional dialysis session today. ID is consulted.  Plan: Plan to give another vancomycin 750 mg IV dose following HD session today, 8/3, as well as ceftriaxone 2 g q24H and metronidazole 500 mg IV q12H. Pt should not need another dose of vancomycin until next HD session. Plan to check vanco random level before next HD session, if patient is still taking.  Height: 5\' 6"  (167.6 cm) Weight: 76.2 kg (167 lb 15.9 oz) IBW/kg (Calculated) : 59.3  Temp (24hrs), Avg:98.9 F (37.2 C), Min:98.4 F (36.9 C), Max:99.6 F (37.6 C)  Recent Labs  Lab 12/11/21 1021 12/11/21 1943 12/12/21 0439  WBC 6.4  --   --   CREATININE 6.81*  --  6.79*  LATICACIDVEN 2.2* 0.9  --     Estimated Creatinine Clearance: 8.8 mL/min (A) (by C-G formula based on SCr of 6.79 mg/dL (H)).    Allergies  Allergen Reactions   Penicillins Nausea And Vomiting    Pt states "only pills" make her vomit Other reaction(s): Nausea/Vomit    Antimicrobials this admission: Cefepime 7/31 x1 Vancomycin 750 mg post-HD on TTS 7/31 >>  Metronidazole 500 mg IV q12H 7/31 >> Ceftriaxone 2 g q24H 8/2 >>  Microbiology results: 7/31: Ucx >100,000 Proteus mirabilis 7/31: Bcx NGTD @ 2 days   Thank you for allowing pharmacy to be a part of this patient's care.  Dara Hoyer, PharmD PGY-1 Pharmacy Resident 12/13/2021 3:21 PM

## 2021-12-14 DIAGNOSIS — Z992 Dependence on renal dialysis: Secondary | ICD-10-CM | POA: Diagnosis not present

## 2021-12-14 DIAGNOSIS — L03114 Cellulitis of left upper limb: Secondary | ICD-10-CM | POA: Diagnosis not present

## 2021-12-14 DIAGNOSIS — N186 End stage renal disease: Secondary | ICD-10-CM | POA: Diagnosis not present

## 2021-12-14 LAB — URINE CULTURE: Culture: >=100000 — AB

## 2021-12-14 MED ORDER — GUAIFENESIN-DM 100-10 MG/5ML PO SYRP
5.0000 mL | ORAL_SOLUTION | ORAL | Status: DC | PRN
  Administered 2021-12-14: 5 mL via ORAL
  Filled 2021-12-14: qty 5

## 2021-12-14 NOTE — Progress Notes (Signed)
Pharmacy Antibiotic Note  Katie [name] is a 66 y.o. female admitted on 12/11/2021 with  wound infection . Patient recently completed 2 weeks of doxycycline after being bitten on left forearm about one month ago. Patient endorses worsening pain the past several days with swelling, erythema on left forearm. Pharmacy has been consulted for Vancomycin dosing. Patient is ESRD on dialysis every Tue/Thur/Sat schedule.  Assessment: 7/31: MRI shows no evidence of DVT 8/1: Afebrile. WBC and vital signs WNL. 8/2: Day #3 of abx. Afebrile. WBC and vital signs WNL. Patient went for additional dialysis session today. ID is consulted. 8/3: Day #4 of abx. Pt continues to be afebrile, with VSS and WBCs WNL. Vanco, ceftriaxone, and Flagyl now discontinued and pt just on cefazolin. Ceftriaxone has non-renal clearance so cefazolin is needed.  Plan: Continue cefazolin 1 g q24H. Pt's next HD session planned for Saturday.  Height: 5\' 6"  (167.6 cm) Weight: 75.3 kg (166 lb) IBW/kg (Calculated) : 59.3  Temp (24hrs), Avg:98.6 F (37 C), Min:98.1 F (36.7 C), Max:99.1 F (37.3 C)  Recent Labs  Lab 12/11/21 1021 12/11/21 1943 12/12/21 0439  WBC 6.4  --   --   CREATININE 6.81*  --  6.79*  LATICACIDVEN 2.2* 0.9  --      Estimated Creatinine Clearance: 8.8 mL/min (A) (by C-G formula based on SCr of 6.79 mg/dL (H)).    Allergies  Allergen Reactions   Penicillins Nausea And Vomiting    Pt states "only pills" make her vomit Other reaction(s): Nausea/Vomit    Antimicrobials this admission: Cefepime 7/31 x1 Vancomycin 750 mg post-HD on TTS 7/31 >> 8/2 Metronidazole 500 mg IV q12H 7/31 >> 8/2 Ceftriaxone 2 g q24H 8/2 x1 Cefazolin 1 g q24H 8/3 >>  Microbiology results: 7/31: Ucx >100,000 Proteus mirabilis 7/31: Bcx NGTD @ 3 days   Thank you for allowing pharmacy to be a part of this patient's care.  Dara Hoyer, PharmD PGY-1 Pharmacy Resident 12/14/2021 7:19 AM

## 2021-12-14 NOTE — Progress Notes (Signed)
[facility]  ROUNDING NOTE   Subjective:   Katie [name] is a 66 year old female with past medical history of ESRD with hemodialysis TTS, diabetes mellitus, and hypertension. She presents to ED with worsening left lower arm swelling and redness. Patient has been admitted for ESRD (end stage renal disease) on dialysis ([place]) [N18.6, Z99.2] Cellulitis of left upper extremity [L03.114] Left arm cellulitis [I50.277]  Patient is known to our practice and receives outpatient dialysis treatments at [facility] on a TTS schedule, supervised by Dr. Holley Raring.  Patient missed treatment on Saturday due to transportation issues.    Patient seen resting comfortably, alert and oriented Tolerating meals without nausea and vomiting Remains on room air Continues to complain of discomfort from left arm.   Objective:  Vital signs in last 24 hours:  Temp:  [98 F (36.7 C)-98.7 F (37.1 C)] 98 F (36.7 C) (08/03 1228) Pulse Rate:  [62-75] 67 (08/03 1228) Resp:  [13-18] 16 (08/03 1228) BP: (135-177)/(64-139) 153/75 (08/03 1228) SpO2:  [95 %-100 %] 100 % (08/03 1228) Weight:  [75.3 kg-76.2 kg] 75.3 kg (08/03 0255)  Weight change: 0.4 kg Filed Weights   12/13/21 1137 12/13/21 1501 12/14/21 0255  Weight: 76.2 kg 76.2 kg 75.3 kg    Intake/Output: I/O last 3 completed shifts: In: 759.9 [IV Piggyback:759.9] Out: 1200 [Urine:700; Other:500]   Intake/Output this shift:  No intake/output data recorded.  Physical Exam: General: NAD  Head: Normocephalic, atraumatic. Moist oral mucosal membranes  Eyes: Anicteric  Lungs:  Clear to auscultation, normal effort, room air  Heart: Regular rate and rhythm  Abdomen:  Soft, nontender  Extremities: 1+ peripheral edema.  Left upper extremity 3+ edema  Neurologic: Nonfocal, moving all four extremities  Skin: No lesions left upper extremity erythema  Access: Right chest PermCath    Basic Metabolic Panel: Recent Labs  Lab 12/11/21 1021  12/12/21 0439  NA 139 137  K 4.5 4.5  CL 103 103  CO2 26 24  GLUCOSE 157* 93  BUN 38* 44*  CREATININE 6.81* 6.79*  CALCIUM 8.5* 8.3*  PHOS  --  5.6*     Liver Function Tests: Recent Labs  Lab 12/11/21 1021  AST 20  ALT 17  ALKPHOS 83  BILITOT 0.7  PROT 5.9*  ALBUMIN 2.8*    No results for input(s): "LIPASE", "AMYLASE" in the last 168 hours. No results for input(s): "AMMONIA" in the last 168 hours.  CBC: Recent Labs  Lab 12/11/21 1021  WBC 6.4  NEUTROABS 3.8  HGB 10.0*  HCT 32.8*  MCV 97.9  PLT 219     Cardiac Enzymes: No results for input(s): "CKTOTAL", "CKMB", "CKMBINDEX", "TROPONINI" in the last 168 hours.  BNP: Invalid input(s): "POCBNP"  CBG: Recent Labs  Lab 12/13/21 0905  GLUCAP 104*     Microbiology: Results for orders placed or performed during the hospital encounter of 12/11/21  Blood Culture (routine x 2)     Status: None (Preliminary result)   Collection Time: 12/11/21 10:21 AM   Specimen: BLOOD  Result Value Ref Range Status   Specimen Description BLOOD LEFT [facility]  Final   Special Requests   Final    BOTTLES DRAWN AEROBIC AND ANAEROBIC Blood Culture adequate volume   Culture   Final    NO GROWTH 3 DAYS Performed at [facility], [address] [place], [place] [address]    Report Status PENDING  Incomplete  Blood Culture (routine x 2)     Status: None (Preliminary result)  Collection Time: 12/11/21 10:21 AM   Specimen: BLOOD  Result Value Ref Range Status   Specimen Description BLOOD RIGHT ARM  Final   Special Requests   Final    BOTTLES DRAWN AEROBIC AND ANAEROBIC Blood Culture results may not be optimal due to an excessive volume of blood received in culture bottles   Culture   Final    NO GROWTH 3 DAYS Performed at [facility], [address] [place], [place] [address]    Report Status PENDING  Incomplete  Urine Culture     Status: Abnormal   Collection Time: 12/11/21  5:36 PM   Specimen: Urine,  Random  Result Value Ref Range Status   Specimen Description   Final    URINE, RANDOM Performed at [facility], [address] [place], [place] [address]    Special Requests   Final    NONE Performed at [facility], [address], [place], [place] [address]    Culture >=100,000 COLONIES/mL PROTEUS MIRABILIS (A)  Final   Report Status 12/14/2021 FINAL  Final   Organism ID, Bacteria PROTEUS MIRABILIS (A)  Final      Susceptibility   Proteus mirabilis - MIC*    AMPICILLIN <=2 SENSITIVE Sensitive     CEFAZOLIN 8 SENSITIVE Sensitive     CEFEPIME <=0.12 SENSITIVE Sensitive     CEFTRIAXONE <=0.25 SENSITIVE Sensitive     CIPROFLOXACIN 1 RESISTANT Resistant     GENTAMICIN <=1 SENSITIVE Sensitive     IMIPENEM 1 SENSITIVE Sensitive     NITROFURANTOIN 128 RESISTANT Resistant     TRIMETH/SULFA <=20 SENSITIVE Sensitive     AMPICILLIN/SULBACTAM <=2 SENSITIVE Sensitive     PIP/TAZO <=4 SENSITIVE Sensitive     * >=100,000 COLONIES/mL PROTEUS MIRABILIS    Coagulation Studies: No results for input(s): "LABPROT", "INR" in the last 72 hours.   Urinalysis: Recent Labs    12/11/21 1736  COLORURINE YELLOW*  LABSPEC 1.013  PHURINE 8.0  GLUCOSEU NEGATIVE  HGBUR NEGATIVE  BILIRUBINUR NEGATIVE  KETONESUR NEGATIVE  PROTEINUR 100*  NITRITE NEGATIVE  LEUKOCYTESUR LARGE*       Imaging: MR FOREARM LEFT W WO CONTRAST  Result Date: 12/13/2021 CLINICAL DATA:  Soft tissue mass, forearm, deep; Soft tissue mass, upper arm, deep EXAM: MRI OF THE LEFT FOREARM WITHOUT AND WITH CONTRAST; MRI OF THE LEFT HUMERUS WITHOUT AND WITH CONTRAST TECHNIQUE: Multiplanar, multisequence MR imaging of the left forearm and left humerus was performed before and after the administration of intravenous contrast. CONTRAST:  20mL GADAVIST GADOBUTROL 1 MMOL/ML IV SOLN COMPARISON:  None Available. FINDINGS: Bones/Joint/Cartilage There is no significant marrow signal alteration. The cortex is intact. No  aggressive bone lesion. There is a trace elbow joint effusion. Muscles and Tendons There is generalized loss of muscle bulk. There is intramuscular edema within the brachialis, brachioradialis, and involving the flexor compartment musculature of the forearm with mild interfascial edema. There is no intramuscular collection. Soft tissues There is extensive skin thickening and subcutaneous soft tissue swelling of the left upper extremity, extending from the upper arm through the forearm, and worst along the forearm. There is no well-defined/drainable fluid collection. IMPRESSION: Findings are most consistent with left upper extremity cellulitis with myofasciitis in the distal upper arm and flexor compartment of the forearm. No evidence of osteomyelitis or soft tissue abscess. Trace elbow joint effusion without other convincing findings to suggest septic arthritis. Electronically Signed   By: Maurine Simmering M.D.   On: 12/13/2021 08:28  MR HUMERUS LEFT W WO CONTRAST  Result Date: 12/13/2021 CLINICAL DATA:  Soft tissue mass, forearm, deep; Soft tissue mass, upper arm, deep EXAM: MRI OF THE LEFT FOREARM WITHOUT AND WITH CONTRAST; MRI OF THE LEFT HUMERUS WITHOUT AND WITH CONTRAST TECHNIQUE: Multiplanar, multisequence MR imaging of the left forearm and left humerus was performed before and after the administration of intravenous contrast. CONTRAST:  38mL GADAVIST GADOBUTROL 1 MMOL/ML IV SOLN COMPARISON:  None Available. FINDINGS: Bones/Joint/Cartilage There is no significant marrow signal alteration. The cortex is intact. No aggressive bone lesion. There is a trace elbow joint effusion. Muscles and Tendons There is generalized loss of muscle bulk. There is intramuscular edema within the brachialis, brachioradialis, and involving the flexor compartment musculature of the forearm with mild interfascial edema. There is no intramuscular collection. Soft tissues There is extensive skin thickening and subcutaneous soft tissue  swelling of the left upper extremity, extending from the upper arm through the forearm, and worst along the forearm. There is no well-defined/drainable fluid collection. IMPRESSION: Findings are most consistent with left upper extremity cellulitis with myofasciitis in the distal upper arm and flexor compartment of the forearm. No evidence of osteomyelitis or soft tissue abscess. Trace elbow joint effusion without other convincing findings to suggest septic arthritis. Electronically Signed   By: Maurine Simmering M.D.   On: 12/13/2021 08:28     Medications:     ceFAZolin (ANCEF) IV 1 g (12/14/21 1049)    carvedilol  6.25 mg Oral QPM   Chlorhexidine Gluconate Cloth  6 each Topical Q0600   citalopram  10 mg Oral Daily   furosemide  40 mg Oral Daily   heparin  5,000 Units Subcutaneous Q8H   pantoprazole  40 mg Oral BID   traZODone  50 mg Oral QHS   acetaminophen, diphenhydrAMINE, hydrALAZINE, HYDROmorphone (DILAUDID) injection, ondansetron (ZOFRAN) IV, ondansetron (ZOFRAN) IV, oxyCODONE-acetaminophen, senna-docusate, tiZANidine, topiramate  Assessment/ Plan:  Katie [name] is a 66 y.o.  female   with past medical history of ESRD with hemodialysis TTS, diabetes mellitus, and hypertension. She presents to ED with worsening left lower arm swelling and redness. Patient has been admitted for ESRD (end stage renal disease) on dialysis ([place]) [N18.6, Z99.2] Cellulitis of left upper extremity [L03.114] Left arm cellulitis [X32.355]   [facility]/TTS/right PermCath/83.5 kg  End-stage renal disease on hemodialysis.  Will maintain outpatient schedule if possible.  Patient received additional dialysis treatment yesterday due to MRI with contrast.  Next treatment scheduled for Saturday.  2. Anemia of chronic kidney disease Lab Results  Component Value Date   HGB 10.0 (L) 12/11/2021    Patient receives [facility] outpatient.  We will continue to monitor.  3. Secondary Hyperparathyroidism:  Lab  Results  Component Value Date   PTH 70 (H) 05/12/2020   CALCIUM 8.3 (L) 12/12/2021   PHOS 5.6 (H) 12/12/2021    We will order updated labs in a.m.  4.  Hypertension with chronic kidney disease.  Home regimen includes carvedilol and furosemide 40 mg daily.  Currently receiving these inpatient.  Blood pressure 153/75, stable  5.  Suspected left arm cellulitis evidenced by edema, erythema, warm to touch.  Likely secondary to wound infection.  MRI with contrast completed on 12/12/2021 confirming cellulitis, no osteomyelitis noted.  Patient will continue vancomycin with dialysis treatments.   LOS: 3 Karnisha Lefebre 8/3/20231:02 PM

## 2021-12-14 NOTE — TOC Progression Note (Signed)
Transition of Care [facility]) - Progression Note    Patient Details  Name: Katie [name] MRN: [id number] Date of Birth: Mar 30, 1956  Transition of Care [facility]) CM/SW [place], RN Phone Number: 12/14/2021, 8:36 AM  Clinical Narrative:     The patient was discharged 4 weeks ago to [facility], FL2 completed, Goes to dialysis TTS at [facility],        Expected Discharge Plan and Services                                                 Social Determinants of Health (SDOH) Interventions    Readmission Risk Interventions    09/27/2021    1:44 PM 09/26/2021   12:46 PM 03/08/2021    2:49 PM  Readmission Risk Prevention Plan  Transportation Screening  Complete Complete  Palliative Care Screening Not Applicable    Medication Review (RN Care Manager)  Complete Complete  HRI or Home Care Consult   Complete  SW Recovery Care/Counseling Consult  Complete   Palliative Care Screening  Not Applicable   [place]  Not Applicable Complete

## 2021-12-14 NOTE — Plan of Care (Signed)

## 2021-12-14 NOTE — NC FL2 (Signed)
[place] LEVEL OF CARE SCREENING TOOL     IDENTIFICATION  Patient Name: Katie [name] Birthdate: [birth year] Sex: female Admission Date (Current Location): 12/11/2021  [facility] and [place] Number:  Engineering geologist and Address:  [facility], [address] [place], [place] [address]      Provider Number: [id number]  Attending Physician Name and Address:  Jennye Boroughs, MD  Relative Name and Phone Number:       Current Level of Care: Hospital Recommended Level of Care: [place] Prior Approval Number:    Date Approved/Denied:   PASRR Number: [id number]  Discharge Plan: Other (Comment) (ALF)    Current Diagnoses: Patient Active Problem List   Diagnosis Date Noted   Cellulitis of left upper extremity 12/11/2021   Type II diabetes mellitus with renal manifestations ([place]) 12/11/2021   Anemia in ESRD (end-stage renal disease) ([place]) 12/11/2021   Elevated lactic acid level 12/11/2021   Chronic diastolic CHF (congestive heart failure) ([place]) 12/11/2021   Left arm cellulitis 11/09/2021   Adjustment disorder with mixed anxiety and depressed mood 11/07/2021   Aortic stenosis 11/01/2021   Fecal impaction ([place]) 10/27/2021   Bent bone fracture of ulna 10/24/2021   Hospital acquired PNA 10/24/2021   Sepsis ([place]) 10/23/2021   Cellulitis 10/23/2021   Right flank pain 09/23/2021   Iron deficiency anemia, unspecified 03/27/2021   Allergy, unspecified, initial encounter 03/22/2021   Anaphylactic shock, unspecified, initial encounter 03/22/2021   Unspecified jaundice 03/15/2021   Allergy status to penicillin 03/14/2021   Anemia in chronic kidney disease 03/14/2021   Chronic diastolic (congestive) heart failure ([place]) 03/14/2021   Dependence on renal dialysis ([place]) 03/14/2021   Depression, unspecified 03/14/2021   Hypertensive heart and chronic kidney disease with heart failure and with stage 5 chronic kidney disease, or end  stage renal disease ([place]) 03/14/2021   Personal history of nicotine dependence 03/14/2021   Secondary hyperparathyroidism of renal origin ([place]) 03/14/2021   Frequent falls 03/07/2021   Financial difficulty 03/07/2021   ESRD (end stage renal disease) ([place]) 12/23/2020   Persistent headaches 06/27/2020   ATN (acute tubular necrosis) (HCC)    Pulmonary hypertension, unspecified (HCC)    Chronic kidney disease (CKD), stage IV (severe) (HCC)    Pleural effusion    Acute heart failure with preserved ejection fraction (HFpEF) (HCC)    Pressure injury of skin 05/01/2020   Hyperkalemia    Volume overload 04/30/2020   Hypertension    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HCC)    Type 2 diabetes mellitus (HCC)     Orientation RESPIRATION BLADDER Height & Weight     Self, Time, Situation, Place  Normal Continent Weight: 75.3 kg Height:  5\' 6"  (167.6 cm)  BEHAVIORAL SYMPTOMS/MOOD NEUROLOGICAL BOWEL NUTRITION STATUS      Continent Diet (fluid restriction 1200 ML, Renal diet)  AMBULATORY STATUS COMMUNICATION OF NEEDS Skin   Limited Assist Verbally Other (Comment) (redness of left arm)                       Personal Care Assistance Level of Assistance  Bathing, Feeding, Dressing Bathing Assistance: Limited assistance Feeding assistance: Independent Dressing Assistance: Limited assistance     Functional Limitations Info  Sight, Hearing, Speech Sight Info: Adequate Hearing Info: Adequate Speech Info: Adequate    SPECIAL CARE FACTORS FREQUENCY  Contractures Contractures Info: Not present    Additional Factors Info  Code Status, Allergies Code Status Info: Full code Allergies Info: Penicillin           Current Medications (12/14/2021):  This is the current hospital active medication list Current Facility-Administered Medications  Medication Dose Route Frequency Provider Last Rate Last Admin   acetaminophen (TYLENOL) tablet 650 mg  650 mg  Oral Q6H PRN Ivor Costa, MD   650 mg at 12/13/21 1552   carvedilol (COREG) tablet 6.25 mg  6.25 mg Oral QPM Ivor Costa, MD   6.25 mg at 12/13/21 1718   ceFAZolin (ANCEF) IVPB 1 g/50 mL premix  1 g Intravenous Daily Ravishankar, Joellyn Quails, MD       Chlorhexidine Gluconate Cloth 2 % PADS 6 each  6 each Topical Q0600 Colon Flattery, NP   6 each at 12/14/21 0504   citalopram (CELEXA) tablet 10 mg  10 mg Oral Daily Ivor Costa, MD   10 mg at 12/13/21 0914   diphenhydrAMINE (BENADRYL) injection 25 mg  25 mg Intravenous Q dialysis Colon Flattery, NP   25 mg at 12/13/21 1142   furosemide (LASIX) tablet 40 mg  40 mg Oral Daily Ivor Costa, MD   40 mg at 12/13/21 0914   heparin injection 5,000 Units  5,000 Units Subcutaneous Cleophas Dunker, MD   5,000 Units at 12/14/21 0504   hydrALAZINE (APRESOLINE) injection 5 mg  5 mg Intravenous Q2H PRN Ivor Costa, MD       HYDROmorphone (DILAUDID) injection 0.5 mg  0.5 mg Intravenous Q4H PRN Ivor Costa, MD   0.5 mg at 12/12/21 1818   ondansetron (ZOFRAN) injection 4 mg  4 mg Intravenous Q8H PRN Ivor Costa, MD   4 mg at 12/12/21 0919   ondansetron (ZOFRAN) injection 4 mg  4 mg Intravenous Q dialysis Breeze, Benancio Deeds, NP   4 mg at 12/13/21 1140   oxyCODONE-acetaminophen (PERCOCET/ROXICET) 5-325 MG per tablet 1 tablet  1 tablet Oral Q8H PRN Ivor Costa, MD   1 tablet at 12/14/21 0414   pantoprazole (PROTONIX) EC tablet 40 mg  40 mg Oral BID Ivor Costa, MD   40 mg at 12/13/21 2117   senna-docusate (Senokot-S) tablet 1 tablet  1 tablet Oral BID PRN Ivor Costa, MD   1 tablet at 12/13/21 2117   tiZANidine (ZANAFLEX) tablet 4 mg  4 mg Oral BID PRN Ivor Costa, MD   4 mg at 12/13/21 1552   topiramate (TOPAMAX) tablet 50 mg  50 mg Oral BID PRN Ivor Costa, MD       traZODone (DESYREL) tablet 50 mg  50 mg Oral QHS Ivor Costa, MD   50 mg at 12/13/21 2117     Discharge Medications: Please see discharge summary for a list of discharge medications.  Relevant Imaging  Results:  Relevant Lab Results:   Additional Information [id number]  Conception Oms, RN

## 2021-12-14 NOTE — Plan of Care (Signed)
  Problem: Education: Goal: Knowledge of General Education information will improve Description: Including pain rating scale, medication(s)/side effects and non-pharmacologic comfort measures Outcome: Progressing   Problem: Health Behavior/Discharge Planning: Goal: Ability to manage health-related needs will improve Outcome: Progressing   Problem: Nutrition: Goal: Adequate nutrition will be maintained Outcome: Progressing   Problem: Coping: Goal: Level of anxiety will decrease Outcome: Progressing   Problem: Pain Managment: Goal: General experience of comfort will improve Outcome: Progressing   Problem: Safety: Goal: Ability to remain free from injury will improve Outcome: Progressing   

## 2021-12-14 NOTE — Consult Note (Signed)
[facility] Vascular Consult Note  MRN : [id number]  Katie [name] is a 66 y.o. (1955-11-15) female who presents with chief complaint of  Chief Complaint  Patient presents with   Extremity Pain  .   Consulting Physician: Jennye Boroughs, MD Reason for consult: Upper extremity cellulitis History of Present Illness: Katie [name] is a 66 year old female that has a previous medical history of end-stage renal disease, hypertension, diabetes mellitus, and HFpEF that presented to [facility] with pain and swelling of her left upper extremity.  The patient was found to have left upper extremity cellulitis.  The patient had a dog bite approximately a month ago when she was treated for infection in the left forearm.  Prior to presentation to the emergency room she has had worsening pain and swelling in her left forearm.  The patient also has a known AV graft in her left upper extremity.  This graft was placed on 04/02/2021.  There is a brachial axillary AV graft.  She notes that it was used once and based on description, it seems that she had infiltration.  Since that time she has not really utilized her fistula.  She currently has a PermCath in place which she uses for dialysis.  Upper extremity ultrasound revealed a patent AV graft with no evidence of fluid collection around the graft.  Current Facility-Administered Medications  Medication Dose Route Frequency Provider Last Rate Last Admin   acetaminophen (TYLENOL) tablet 650 mg  650 mg Oral Q6H PRN Ivor Costa, MD   650 mg at 12/14/21 1819   carvedilol (COREG) tablet 6.25 mg  6.25 mg Oral QPM Ivor Costa, MD   6.25 mg at 12/14/21 1742   ceFAZolin (ANCEF) IVPB 1 g/50 mL premix  1 g Intravenous Daily Tsosie Billing, MD 100 mL/hr at 12/14/21 1049 1 g at 12/14/21 1049   Chlorhexidine Gluconate Cloth 2 % PADS 6 each  6 each Topical Q0600 Colon Flattery, NP   6 each at 12/14/21 0504   citalopram (CELEXA)  tablet 10 mg  10 mg Oral Daily Ivor Costa, MD   10 mg at 12/14/21 1033   diphenhydrAMINE (BENADRYL) injection 25 mg  25 mg Intravenous Q dialysis Colon Flattery, NP   25 mg at 12/13/21 1142   furosemide (LASIX) tablet 40 mg  40 mg Oral Daily Ivor Costa, MD   40 mg at 12/14/21 1033   guaiFENesin-dextromethorphan (ROBITUSSIN DM) 100-10 MG/5ML syrup 5 mL  5 mL Oral Q4H PRN Jennye Boroughs, MD   5 mL at 12/14/21 2208   heparin injection 5,000 Units  5,000 Units Subcutaneous Cleophas Dunker, MD   5,000 Units at 12/14/21 2210   hydrALAZINE (APRESOLINE) injection 5 mg  5 mg Intravenous Q2H PRN Ivor Costa, MD       HYDROmorphone (DILAUDID) injection 0.5 mg  0.5 mg Intravenous Q4H PRN Ivor Costa, MD   0.5 mg at 12/12/21 1818   ondansetron (ZOFRAN) injection 4 mg  4 mg Intravenous Q8H PRN Ivor Costa, MD   4 mg at 12/12/21 0919   ondansetron (ZOFRAN) injection 4 mg  4 mg Intravenous Q dialysis Colon Flattery, NP   4 mg at 12/13/21 1140   oxyCODONE-acetaminophen (PERCOCET/ROXICET) 5-325 MG per tablet 1 tablet  1 tablet Oral Q8H PRN Ivor Costa, MD   1 tablet at 12/14/21 2206   pantoprazole (PROTONIX) EC tablet 40 mg  40 mg Oral BID Ivor Costa, MD   40 mg at 12/14/21 2206  senna-docusate (Senokot-S) tablet 1 tablet  1 tablet Oral BID PRN Ivor Costa, MD   1 tablet at 12/14/21 2224   tiZANidine (ZANAFLEX) tablet 4 mg  4 mg Oral BID PRN Ivor Costa, MD   4 mg at 12/14/21 1033   topiramate (TOPAMAX) tablet 50 mg  50 mg Oral BID PRN Ivor Costa, MD       traZODone (DESYREL) tablet 50 mg  50 mg Oral QHS Ivor Costa, MD   50 mg at 12/14/21 2206    Past Medical History:  Diagnosis Date   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[place])    Hypertension    Renal disorder    Type 2 diabetes mellitus ([place])     Past Surgical History:  Procedure Laterality Date   APPENDECTOMY     AV FISTULA PLACEMENT Left 03/16/2021   Procedure: INSERTION OF ARTERIOVENOUS (AV) GORE-TEX GRAFT ARM;  Surgeon: Algernon Huxley, MD;   Location: [facility];  Service: Vascular;  Laterality: Left;   DIALYSIS/PERMA CATHETER INSERTION N/A 05/16/2020   Procedure: DIALYSIS/PERMA CATHETER INSERTION;  Surgeon: Algernon Huxley, MD;  Location: [place] [facility];  Service: Cardiovascular;  Laterality: N/A;   MANDIBLE SURGERY     RENAL BIOPSY Right    RIGHT HEART CATH N/A 05/10/2020   Procedure: RIGHT HEART CATH;  Surgeon: Nelva Bush, MD;  Location: [place] [facility];  Service: Cardiovascular;  Laterality: N/A;   TEMPORARY DIALYSIS CATHETER N/A 05/11/2020   Procedure: TEMPORARY DIALYSIS CATHETER;  Surgeon: Algernon Huxley, MD;  Location: [place] [facility];  Service: Cardiovascular;  Laterality: N/A;    Social History Social History   Tobacco Use   Smoking status: Former    Types: Cigarettes   Smokeless tobacco: Never  Vaping Use   Vaping Use: Never used  Substance Use Topics   Alcohol use: Not Currently   Drug use: Never    Family History Family History  Problem Relation Age of Onset   Heart attack Mother    Asthma Mother    Uterine cancer Mother    Skin cancer Father    Prostate cancer Father    CAD Father    Parkinson's disease Father    Lung cancer Brother     Allergies  Allergen Reactions   Penicillins Nausea And Vomiting    Pt states "only pills" make her vomit Other reaction(s): Nausea/Vomit     REVIEW OF SYSTEMS (Negative unless checked)  Constitutional: [] Weight loss  [] Fever  [] Chills Cardiac: [] Chest pain   [] Chest pressure   [] Palpitations   [] Shortness of breath when laying flat   [] Shortness of breath at rest   [] Shortness of breath with exertion. Vascular:  [] Pain in legs with walking   [] Pain in legs at rest   [] Pain in legs when laying flat   [] Claudication   [] Pain in feet when walking  [] Pain in feet at rest  [] Pain in feet when laying flat   [] History of DVT   [] Phlebitis   [] Swelling in legs   [] Varicose veins   [] Non-healing ulcers Pulmonary:   [] Uses home oxygen   [] Productive cough    [] Hemoptysis   [] Wheeze  [] COPD   [] Asthma Neurologic:  [] Dizziness  [] Blackouts   [] Seizures   [] History of stroke   [] History of TIA  [] Aphasia   [] Temporary blindness   [] Dysphagia   [] Weakness or numbness in arms   [] Weakness or numbness in legs Musculoskeletal:  [] Arthritis   [] Joint swelling   []   Joint pain   [] Low back pain Hematologic:  [] Easy bruising  [] Easy bleeding   [] Hypercoagulable state   [] Anemic  [] Hepatitis Gastrointestinal:  [] Blood in stool   [] Vomiting blood  [] Gastroesophageal reflux/heartburn   [] Difficulty swallowing. Genitourinary:  [] Chronic kidney disease   [] Difficult urination  [] Frequent urination  [] Burning with urination   [] Blood in urine Skin:  [] Rashes   [] Ulcers   [] Wounds Psychological:  [] History of anxiety   []  History of major depression.  Physical Examination  Vitals:   12/14/21 0808 12/14/21 1228 12/14/21 1521 12/14/21 1938  BP: 135/75 (!) 153/75 (!) 152/73 (!) 104/45  Pulse: 74 67 65 65  Resp: 16 16 16 17   Temp: 98.5 F (36.9 C) 98 F (36.7 C) (!) 97.5 F (36.4 C) 98 F (36.7 C)  TempSrc: Oral Oral Oral   SpO2: 99% 100% 100% 96%  Weight:      Height:       Body mass index is 26.79 kg/m. Gen:  WD/WN, NAD Head: [place]/AT, No temporalis wasting. Prominent temp pulse not noted. Ear/Nose/Throat: Hearing grossly intact, nares w/o erythema or drainage, oropharynx w/o Erythema/Exudate Eyes: Sclera non-icteric, conjunctiva clear Neck: Trachea midline.  No JVD.  Pulmonary:  Good air movement, respirations not labored, equal bilaterally.  Cardiac: RRR, normal S1, S2. Vascular: Thrill and bruit noted in the left upper extremity Vessel Right Left  Radial Palpable Palpable   Gastrointestinal: soft, non-tender/non-distended. No guarding/reflex.  Musculoskeletal: M/S 5/5 throughout.  Extremities without ischemic changes.  No deformity or atrophy.  Swollen left arm with erythema Neurologic: Sensation grossly intact in extremities.  Symmetrical.  Speech  is fluent. Motor exam as listed above. Psychiatric: Judgment intact, Mood & affect appropriate for pt's clinical situation. Dermatologic: No rashes or ulcers noted.  Left upper extremity cellulitis Lymph : No Cervical, Axillary, or Inguinal lymphadenopathy.    CBC Lab Results  Component Value Date   WBC 6.4 12/11/2021   HGB 10.0 (L) 12/11/2021   HCT 32.8 (L) 12/11/2021   MCV 97.9 12/11/2021   PLT 219 12/11/2021    BMET    Component Value Date/Time   NA 137 12/12/2021 0439   K 4.5 12/12/2021 0439   CL 103 12/12/2021 0439   CO2 24 12/12/2021 0439   GLUCOSE 93 12/12/2021 0439   BUN 44 (H) 12/12/2021 0439   CREATININE 6.79 (H) 12/12/2021 0439   CALCIUM 8.3 (L) 12/12/2021 0439   GFRNONAA 6 (L) 12/12/2021 0439   GFRAA  01/19/2021 0833    SPECIMEN HEMOLYZED. HEMOLYSIS MAY AFFECT INTEGRITY OF RESULTS.   Estimated Creatinine Clearance: 8.8 mL/min (A) (by C-G formula based on SCr of 6.79 mg/dL (H)).  COAG Lab Results  Component Value Date   INR 1.1 12/11/2021   INR 1.0 03/16/2021    Radiology MR FOREARM LEFT W WO CONTRAST  Result Date: 12/13/2021 CLINICAL DATA:  Soft tissue mass, forearm, deep; Soft tissue mass, upper arm, deep EXAM: MRI OF THE LEFT FOREARM WITHOUT AND WITH CONTRAST; MRI OF THE LEFT HUMERUS WITHOUT AND WITH CONTRAST TECHNIQUE: Multiplanar, multisequence MR imaging of the left forearm and left humerus was performed before and after the administration of intravenous contrast. CONTRAST:  14mL GADAVIST GADOBUTROL 1 MMOL/ML IV SOLN COMPARISON:  None Available. FINDINGS: Bones/Joint/Cartilage There is no significant marrow signal alteration. The cortex is intact. No aggressive bone lesion. There is a trace elbow joint effusion. Muscles and Tendons There is generalized loss of muscle bulk. There is intramuscular edema within the brachialis, brachioradialis, and involving the  flexor compartment musculature of the forearm with mild interfascial edema. There is no intramuscular  collection. Soft tissues There is extensive skin thickening and subcutaneous soft tissue swelling of the left upper extremity, extending from the upper arm through the forearm, and worst along the forearm. There is no well-defined/drainable fluid collection. IMPRESSION: Findings are most consistent with left upper extremity cellulitis with myofasciitis in the distal upper arm and flexor compartment of the forearm. No evidence of osteomyelitis or soft tissue abscess. Trace elbow joint effusion without other convincing findings to suggest septic arthritis. Electronically Signed   By: Maurine Simmering M.D.   On: 12/13/2021 08:28   MR HUMERUS LEFT W WO CONTRAST  Result Date: 12/13/2021 CLINICAL DATA:  Soft tissue mass, forearm, deep; Soft tissue mass, upper arm, deep EXAM: MRI OF THE LEFT FOREARM WITHOUT AND WITH CONTRAST; MRI OF THE LEFT HUMERUS WITHOUT AND WITH CONTRAST TECHNIQUE: Multiplanar, multisequence MR imaging of the left forearm and left humerus was performed before and after the administration of intravenous contrast. CONTRAST:  22mL GADAVIST GADOBUTROL 1 MMOL/ML IV SOLN COMPARISON:  None Available. FINDINGS: Bones/Joint/Cartilage There is no significant marrow signal alteration. The cortex is intact. No aggressive bone lesion. There is a trace elbow joint effusion. Muscles and Tendons There is generalized loss of muscle bulk. There is intramuscular edema within the brachialis, brachioradialis, and involving the flexor compartment musculature of the forearm with mild interfascial edema. There is no intramuscular collection. Soft tissues There is extensive skin thickening and subcutaneous soft tissue swelling of the left upper extremity, extending from the upper arm through the forearm, and worst along the forearm. There is no well-defined/drainable fluid collection. IMPRESSION: Findings are most consistent with left upper extremity cellulitis with myofasciitis in the distal upper arm and flexor compartment of  the forearm. No evidence of osteomyelitis or soft tissue abscess. Trace elbow joint effusion without other convincing findings to suggest septic arthritis. Electronically Signed   By: Maurine Simmering M.D.   On: 12/13/2021 08:28   US Venous Img Upper Uni Left  Result Date: 12/11/2021 CLINICAL DATA:  Pain and swelling in LEFT arm, dog by 2 weeks ago, has fistula in same arm EXAM: LEFT UPPER EXTREMITY VENOUS DOPPLER ULTRASOUND TECHNIQUE: Gray-scale sonography with graded compression, as well as color Doppler and duplex ultrasound were performed to evaluate the upper extremity deep venous system from the level of the subclavian vein and including the jugular, axillary, basilic, radial, ulnar and upper cephalic vein. Spectral Doppler was utilized to evaluate flow at rest and with distal augmentation maneuvers. COMPARISON:  11/19/2021 FINDINGS: Contralateral Subclavian Vein: Respiratory phasicity is normal and symmetric with the symptomatic side. No evidence of thrombus. Normal compressibility. Internal Jugular Vein: No evidence of thrombus. Normal compressibility, respiratory phasicity and response to augmentation. Subclavian Vein: No evidence of thrombus. Normal compressibility, respiratory phasicity and response to augmentation. Axillary Vein: No evidence of thrombus. Normal compressibility, respiratory phasicity and response to augmentation. Cephalic Vein: No evidence of thrombus. Normal compressibility, respiratory phasicity and response to augmentation. Basilic Vein: No evidence of thrombus. Normal compressibility, respiratory phasicity and response to augmentation. Brachial Veins: No evidence of thrombus. Normal compressibility, respiratory phasicity and response to augmentation. Radial Veins: No evidence of thrombus. Normal compressibility, respiratory phasicity and response to augmentation. Ulnar Veins: No evidence of thrombus. Normal compressibility, respiratory phasicity and response to augmentation. Venous  Reflux:  None visualized. Other Findings: Dialysis AV fistula present, patent. Scattered soft tissue swelling LEFT forearm. IMPRESSION: No evidence of DVT within the  LEFT upper extremity. Patent dialysis fistula. Electronically Signed   By: Lavonia Dana M.D.   On: 12/11/2021 11:29   DG Chest Port 1 View  Result Date: 12/11/2021 CLINICAL DATA:  Questionable sepsis. EXAM: PORTABLE CHEST 1 VIEW COMPARISON:  November 23, 2021 FINDINGS: Patient is rotated. Right chest dual lumen catheter with tips projecting over the superior cavoatrial junction/right atrium. Enlarged cardiac silhouette. Aortic atherosclerosis. Linear band of scarring versus atelectasis in the left lung base. Bibasilar atelectasis. No new focal airspace consolidation. No visible pleural effusion or pneumothorax. No acute osseous abnormality. IMPRESSION: Similar bibasilar atelectasis. No new focal airspace consolidation. Electronically Signed   By: Dahlia Bailiff M.D.   On: 12/11/2021 10:58   DG Chest Port 1 View  Addendum Date: 11/23/2021   ADDENDUM REPORT: 11/23/2021 12:17 ADDENDUM: Requested No radiographic evidence of TB identified. Electronically Signed   By: Lavonia Dana M.D.   On: 11/23/2021 12:17   Result Date: 11/23/2021 CLINICAL DATA:  Screening, chronic kidney disease, type II diabetes mellitus, hypertension EXAM: PORTABLE CHEST 1 VIEW COMPARISON:  Portable exam 1107 hours compared to 10/23/2021 FINDINGS: RIGHT jugular Port-A-Cath with tip projecting over cavoatrial junction. Enlargement of cardiac silhouette. Mediastinal contours and pulmonary vascularity normal. Atherosclerotic calcification aorta. Minimal bibasilar atelectasis. Lungs otherwise clear. No acute infiltrate, pleural effusion, or pneumothorax. No acute osseous findings. IMPRESSION: Minimal bibasilar atelectasis. Aortic Atherosclerosis (ICD10-I70.0). Electronically Signed: By: Lavonia Dana M.D. On: 11/23/2021 11:18   [place] LT UPPER EXTREM LTD SOFT TISSUE NON VASCULAR  Result  Date: 11/19/2021 CLINICAL DATA:  Left arm pain, redness, and swelling. EXAM: ULTRASOUND LEFT UPPER EXTREMITY LIMITED TECHNIQUE: Ultrasound examination of the upper extremity soft tissues was performed in the area of clinical concern. COMPARISON:  None Available. FINDINGS: Focal ultrasound of the areas of concern in the left upper arm and forearm demonstrate no discrete soft tissue mass or fluid collection. IMPRESSION: 1. Negative. No sonographic abnormality at the sites of the palpable abnormalities confirmed by the patient. Electronically Signed   By: Titus Dubin M.D.   On: 11/19/2021 13:35      Assessment/Plan 1.  Cellulitis  There is concern that the patient's graft may be infected as an underlying cause of her recurrent cellulitis.  Ultrasound did not reveal any evidence of fluid around the graft.  There were no pockets of fluid noted on her MRI.  To further evaluate the graft as a possible source we would recommend a tagged WBC study, as removal of her graft would require a much larger intervention with prolonged wound healing.   2.  End-stage renal disease  While the patient currently is not utilizing this fistula, she has a thrill and bruit present which indicates that it is likely viable for use.  Given the patient's description of issues she, likely needs intervention to improve functionality for dialysis usage.  We would only plan for this intervention once her cellulitis is resolved and has fully been ruled out that it is not the underlying cause of infection.  This can be discussed and evaluated on an outpatient basis.    Thank you for allowing [place] to participate in the care of this patient.   Kris Hartmann, NP [facility] [phone] (Office Phone) [phone] (Office Fax) [phone] (Pager)  12/14/2021 11:41 PM  Staff may message me via secure chat in [place]  but this may not receive immediate response,  please page for urgent  matters!  Dictation software was used to generate the above note.  Typos may occur and escape review, as with typed/written notes. Any error is purely unintentional.  Please contact me directly for clarity if needed.

## 2021-12-14 NOTE — Consult Note (Incomplete)
[facility] Vascular Consult Note  MRN : [id number]  Katie [name] is a 66 y.o. ([birth year]) female who presents with chief complaint of  Chief Complaint  Patient presents with  . Extremity Pain  .   Consulting Physician: Reason for consult: History of Present Illness: ***  Current Facility-Administered Medications  Medication Dose Route Frequency Provider Last Rate Last Admin  . acetaminophen (TYLENOL) tablet 650 mg  650 mg Oral Q6H PRN Ivor Costa, MD   650 mg at 12/14/21 1819  . carvedilol (COREG) tablet 6.25 mg  6.25 mg Oral QPM Ivor Costa, MD   6.25 mg at 12/14/21 1742  . ceFAZolin (ANCEF) IVPB 1 g/50 mL premix  1 g Intravenous Daily Ravishankar, Joellyn Quails, MD 100 mL/hr at 12/14/21 1049 1 g at 12/14/21 1049  . Chlorhexidine Gluconate Cloth 2 % PADS 6 each  6 each Topical Q0600 Colon Flattery, NP   6 each at 12/14/21 0504  . citalopram (CELEXA) tablet 10 mg  10 mg Oral Daily Ivor Costa, MD   10 mg at 12/14/21 1033  . diphenhydrAMINE (BENADRYL) injection 25 mg  25 mg Intravenous Q dialysis Breeze, Benancio Deeds, NP   25 mg at 12/13/21 1142  . furosemide (LASIX) tablet 40 mg  40 mg Oral Daily Ivor Costa, MD   40 mg at 12/14/21 1033  . guaiFENesin-dextromethorphan (ROBITUSSIN DM) 100-10 MG/5ML syrup 5 mL  5 mL Oral Q4H PRN Jennye Boroughs, MD   5 mL at 12/14/21 2208  . heparin injection 5,000 Units  5,000 Units Subcutaneous Q8H Ivor Costa, MD   5,000 Units at 12/14/21 2210  . hydrALAZINE (APRESOLINE) injection 5 mg  5 mg Intravenous Q2H PRN Ivor Costa, MD      . HYDROmorphone (DILAUDID) injection 0.5 mg  0.5 mg Intravenous Q4H PRN Ivor Costa, MD   0.5 mg at 12/12/21 1818  . ondansetron (ZOFRAN) injection 4 mg  4 mg Intravenous Q8H PRN Ivor Costa, MD   4 mg at 12/12/21 0919  . ondansetron (ZOFRAN) injection 4 mg  4 mg Intravenous Q dialysis Breeze, Benancio Deeds, NP   4 mg at 12/13/21 1140  . oxyCODONE-acetaminophen (PERCOCET/ROXICET) 5-325 MG per tablet 1 tablet  1  tablet Oral Q8H PRN Ivor Costa, MD   1 tablet at 12/14/21 2206  . pantoprazole (PROTONIX) EC tablet 40 mg  40 mg Oral BID Ivor Costa, MD   40 mg at 12/14/21 2206  . senna-docusate (Senokot-S) tablet 1 tablet  1 tablet Oral BID PRN Ivor Costa, MD   1 tablet at 12/14/21 2224  . tiZANidine (ZANAFLEX) tablet 4 mg  4 mg Oral BID PRN Ivor Costa, MD   4 mg at 12/14/21 1033  . topiramate (TOPAMAX) tablet 50 mg  50 mg Oral BID PRN Ivor Costa, MD      . traZODone (DESYREL) tablet 50 mg  50 mg Oral QHS Ivor Costa, MD   50 mg at 12/14/21 2206    Past Medical History:  Diagnosis Date  . 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[place])   . Hypertension   . Renal disorder   . Type 2 diabetes mellitus ([place])     Past Surgical History:  Procedure Laterality Date  . APPENDECTOMY    . AV FISTULA PLACEMENT Left 03/16/2021   Procedure: INSERTION OF ARTERIOVENOUS (AV) GORE-TEX GRAFT ARM;  Surgeon: Algernon Huxley, MD;  Location: [facility];  Service: Vascular;  Laterality: Left;  . DIALYSIS/PERMA CATHETER INSERTION N/A 05/16/2020  Procedure: DIALYSIS/PERMA CATHETER INSERTION;  Surgeon: Algernon Huxley, MD;  Location: [facility];  Service: Cardiovascular;  Laterality: N/A;  . MANDIBLE SURGERY    . RENAL BIOPSY Right   . RIGHT HEART CATH N/A 05/10/2020   Procedure: RIGHT HEART CATH;  Surgeon: Nelva Bush, MD;  Location: [facility];  Service: Cardiovascular;  Laterality: N/A;  . TEMPORARY DIALYSIS CATHETER N/A 05/11/2020   Procedure: TEMPORARY DIALYSIS CATHETER;  Surgeon: Algernon Huxley, MD;  Location: [facility];  Service: Cardiovascular;  Laterality: N/A;    Social History Social History   Tobacco Use  . Smoking status: Former    Types: Cigarettes  . Smokeless tobacco: Never  Vaping Use  . Vaping Use: Never used  Substance Use Topics  . Alcohol use: Not Currently  . Drug use: Never    Family History Family History  Problem Relation Age of Onset  . Heart attack Mother    . Asthma Mother   . Uterine cancer Mother   . Skin cancer Father   . Prostate cancer Father   . CAD Father   . Parkinson's disease Father   . Lung cancer Brother     Allergies  Allergen Reactions  . Penicillins Nausea And Vomiting    Pt states "only pills" make her vomit Other reaction(s): Nausea/Vomit     REVIEW OF SYSTEMS (Negative unless checked)  Constitutional: [] Weight loss  [] Fever  [] Chills Cardiac: [] Chest pain   [] Chest pressure   [] Palpitations   [] Shortness of breath when laying flat   [] Shortness of breath at rest   [] Shortness of breath with exertion. Vascular:  [] Pain in legs with walking   [] Pain in legs at rest   [] Pain in legs when laying flat   [] Claudication   [] Pain in feet when walking  [] Pain in feet at rest  [] Pain in feet when laying flat   [] History of DVT   [] Phlebitis   [] Swelling in legs   [] Varicose veins   [] Non-healing ulcers Pulmonary:   [] Uses home oxygen   [] Productive cough   [] Hemoptysis   [] Wheeze  [] COPD   [] Asthma Neurologic:  [] Dizziness  [] Blackouts   [] Seizures   [] History of stroke   [] History of TIA  [] Aphasia   [] Temporary blindness   [] Dysphagia   [] Weakness or numbness in arms   [] Weakness or numbness in legs Musculoskeletal:  [] Arthritis   [] Joint swelling   [] Joint pain   [] Low back pain Hematologic:  [] Easy bruising  [] Easy bleeding   [] Hypercoagulable state   [] Anemic  [] Hepatitis Gastrointestinal:  [] Blood in stool   [] Vomiting blood  [] Gastroesophageal reflux/heartburn   [] Difficulty swallowing. Genitourinary:  [] Chronic kidney disease   [] Difficult urination  [] Frequent urination  [] Burning with urination   [] Blood in urine Skin:  [] Rashes   [] Ulcers   [] Wounds Psychological:  [] History of anxiety   []  History of major depression.  Physical Examination  Vitals:   12/14/21 0808 12/14/21 1228 12/14/21 1521 12/14/21 1938  BP: 135/75 (!) 153/75 (!) 152/73 (!) 104/45  Pulse: 74 67 65 65  Resp: 16 16 16 17   Temp: 98.5 F (36.9  C) 98 F (36.7 C) (!) 97.5 F (36.4 C) 98 F (36.7 C)  TempSrc: Oral Oral Oral   SpO2: 99% 100% 100% 96%  Weight:      Height:       Body mass index is 26.79 kg/m. Gen:  WD/WN, NAD Head: [place]/AT, No temporalis wasting. Prominent temp pulse not  noted. Ear/Nose/Throat: Hearing grossly intact, nares w/o erythema or drainage, oropharynx w/o Erythema/Exudate Eyes: Sclera non-icteric, conjunctiva clear Neck: Trachea midline.  No JVD.  Pulmonary:  Good air movement, respirations not labored, equal bilaterally.  Cardiac: RRR, normal S1, S2. Vascular: *** Vessel Right Left  Radial Palpable Palpable  Ulnar Palpable Palpable  Brachial Palpable Palpable  Carotid Palpable, without bruit Palpable, without bruit  Aorta Not palpable N/A  Femoral Palpable Palpable  Popliteal Palpable Palpable  PT Palpable Palpable  DP Palpable Palpable   Gastrointestinal: soft, non-tender/non-distended. No guarding/reflex.  Musculoskeletal: M/S 5/5 throughout.  Extremities without ischemic changes.  No deformity or atrophy. No edema. Neurologic: Sensation grossly intact in extremities.  Symmetrical.  Speech is fluent. Motor exam as listed above. Psychiatric: Judgment intact, Mood & affect appropriate for pt's clinical situation. Dermatologic: No rashes or ulcers noted.  No cellulitis or open wounds. Lymph : No Cervical, Axillary, or Inguinal lymphadenopathy.    CBC Lab Results  Component Value Date   WBC 6.4 12/11/2021   HGB 10.0 (L) 12/11/2021   HCT 32.8 (L) 12/11/2021   MCV 97.9 12/11/2021   PLT 219 12/11/2021    BMET    Component Value Date/Time   NA 137 12/12/2021 0439   K 4.5 12/12/2021 0439   CL 103 12/12/2021 0439   CO2 24 12/12/2021 0439   GLUCOSE 93 12/12/2021 0439   BUN 44 (H) 12/12/2021 0439   CREATININE 6.79 (H) 12/12/2021 0439   CALCIUM 8.3 (L) 12/12/2021 0439   GFRNONAA 6 (L) 12/12/2021 0439   GFRAA  01/19/2021 0833    SPECIMEN HEMOLYZED. HEMOLYSIS MAY AFFECT INTEGRITY OF  RESULTS.   Estimated Creatinine Clearance: 8.8 mL/min (A) (by C-G formula based on SCr of 6.79 mg/dL (H)).  COAG Lab Results  Component Value Date   INR 1.1 12/11/2021   INR 1.0 03/16/2021    Radiology MR FOREARM LEFT W WO CONTRAST  Result Date: 12/13/2021 CLINICAL DATA:  Soft tissue mass, forearm, deep; Soft tissue mass, upper arm, deep EXAM: MRI OF THE LEFT FOREARM WITHOUT AND WITH CONTRAST; MRI OF THE LEFT HUMERUS WITHOUT AND WITH CONTRAST TECHNIQUE: Multiplanar, multisequence MR imaging of the left forearm and left humerus was performed before and after the administration of intravenous contrast. CONTRAST:  47mL GADAVIST GADOBUTROL 1 MMOL/ML IV SOLN COMPARISON:  None Available. FINDINGS: Bones/Joint/Cartilage There is no significant marrow signal alteration. The cortex is intact. No aggressive bone lesion. There is a trace elbow joint effusion. Muscles and Tendons There is generalized loss of muscle bulk. There is intramuscular edema within the brachialis, brachioradialis, and involving the flexor compartment musculature of the forearm with mild interfascial edema. There is no intramuscular collection. Soft tissues There is extensive skin thickening and subcutaneous soft tissue swelling of the left upper extremity, extending from the upper arm through the forearm, and worst along the forearm. There is no well-defined/drainable fluid collection. IMPRESSION: Findings are most consistent with left upper extremity cellulitis with myofasciitis in the distal upper arm and flexor compartment of the forearm. No evidence of osteomyelitis or soft tissue abscess. Trace elbow joint effusion without other convincing findings to suggest septic arthritis. Electronically Signed   By: Maurine Simmering M.D.   On: 12/13/2021 08:28   MR HUMERUS LEFT W WO CONTRAST  Result Date: 12/13/2021 CLINICAL DATA:  Soft tissue mass, forearm, deep; Soft tissue mass, upper arm, deep EXAM: MRI OF THE LEFT FOREARM WITHOUT AND WITH  CONTRAST; MRI OF THE LEFT HUMERUS WITHOUT AND WITH CONTRAST TECHNIQUE:  Multiplanar, multisequence MR imaging of the left forearm and left humerus was performed before and after the administration of intravenous contrast. CONTRAST:  70mL GADAVIST GADOBUTROL 1 MMOL/ML IV SOLN COMPARISON:  None Available. FINDINGS: Bones/Joint/Cartilage There is no significant marrow signal alteration. The cortex is intact. No aggressive bone lesion. There is a trace elbow joint effusion. Muscles and Tendons There is generalized loss of muscle bulk. There is intramuscular edema within the brachialis, brachioradialis, and involving the flexor compartment musculature of the forearm with mild interfascial edema. There is no intramuscular collection. Soft tissues There is extensive skin thickening and subcutaneous soft tissue swelling of the left upper extremity, extending from the upper arm through the forearm, and worst along the forearm. There is no well-defined/drainable fluid collection. IMPRESSION: Findings are most consistent with left upper extremity cellulitis with myofasciitis in the distal upper arm and flexor compartment of the forearm. No evidence of osteomyelitis or soft tissue abscess. Trace elbow joint effusion without other convincing findings to suggest septic arthritis. Electronically Signed   By: Maurine Simmering M.D.   On: 12/13/2021 08:28   US Venous Img Upper Uni Left  Result Date: 12/11/2021 CLINICAL DATA:  Pain and swelling in LEFT arm, dog by 2 weeks ago, has fistula in same arm EXAM: LEFT UPPER EXTREMITY VENOUS DOPPLER ULTRASOUND TECHNIQUE: Gray-scale sonography with graded compression, as well as color Doppler and duplex ultrasound were performed to evaluate the upper extremity deep venous system from the level of the subclavian vein and including the jugular, axillary, basilic, radial, ulnar and upper cephalic vein. Spectral Doppler was utilized to evaluate flow at rest and with distal augmentation maneuvers.  COMPARISON:  11/19/2021 FINDINGS: Contralateral Subclavian Vein: Respiratory phasicity is normal and symmetric with the symptomatic side. No evidence of thrombus. Normal compressibility. Internal Jugular Vein: No evidence of thrombus. Normal compressibility, respiratory phasicity and response to augmentation. Subclavian Vein: No evidence of thrombus. Normal compressibility, respiratory phasicity and response to augmentation. Axillary Vein: No evidence of thrombus. Normal compressibility, respiratory phasicity and response to augmentation. Cephalic Vein: No evidence of thrombus. Normal compressibility, respiratory phasicity and response to augmentation. Basilic Vein: No evidence of thrombus. Normal compressibility, respiratory phasicity and response to augmentation. Brachial Veins: No evidence of thrombus. Normal compressibility, respiratory phasicity and response to augmentation. Radial Veins: No evidence of thrombus. Normal compressibility, respiratory phasicity and response to augmentation. Ulnar Veins: No evidence of thrombus. Normal compressibility, respiratory phasicity and response to augmentation. Venous Reflux:  None visualized. Other Findings: Dialysis AV fistula present, patent. Scattered soft tissue swelling LEFT forearm. IMPRESSION: No evidence of DVT within the LEFT upper extremity. Patent dialysis fistula. Electronically Signed   By: Lavonia Dana M.D.   On: 12/11/2021 11:29   DG Chest Port 1 View  Result Date: 12/11/2021 CLINICAL DATA:  Questionable sepsis. EXAM: PORTABLE CHEST 1 VIEW COMPARISON:  November 23, 2021 FINDINGS: Patient is rotated. Right chest dual lumen catheter with tips projecting over the superior cavoatrial junction/right atrium. Enlarged cardiac silhouette. Aortic atherosclerosis. Linear band of scarring versus atelectasis in the left lung base. Bibasilar atelectasis. No new focal airspace consolidation. No visible pleural effusion or pneumothorax. No acute osseous abnormality.  IMPRESSION: Similar bibasilar atelectasis. No new focal airspace consolidation. Electronically Signed   By: Dahlia Bailiff M.D.   On: 12/11/2021 10:58   DG Chest Port 1 View  Addendum Date: 11/23/2021   ADDENDUM REPORT: 11/23/2021 12:17 ADDENDUM: Requested No radiographic evidence of TB identified. Electronically Signed   By: Crist Infante.D.  On: 11/23/2021 12:17   Result Date: 11/23/2021 CLINICAL DATA:  Screening, chronic kidney disease, type II diabetes mellitus, hypertension EXAM: PORTABLE CHEST 1 VIEW COMPARISON:  Portable exam 1107 hours compared to 10/23/2021 FINDINGS: RIGHT jugular Port-A-Cath with tip projecting over cavoatrial junction. Enlargement of cardiac silhouette. Mediastinal contours and pulmonary vascularity normal. Atherosclerotic calcification aorta. Minimal bibasilar atelectasis. Lungs otherwise clear. No acute infiltrate, pleural effusion, or pneumothorax. No acute osseous findings. IMPRESSION: Minimal bibasilar atelectasis. Aortic Atherosclerosis (ICD10-I70.0). Electronically Signed: By: Lavonia Dana M.D. On: 11/23/2021 11:18   [place] LT UPPER EXTREM LTD SOFT TISSUE NON VASCULAR  Result Date: 11/19/2021 CLINICAL DATA:  Left arm pain, redness, and swelling. EXAM: ULTRASOUND LEFT UPPER EXTREMITY LIMITED TECHNIQUE: Ultrasound examination of the upper extremity soft tissues was performed in the area of clinical concern. COMPARISON:  None Available. FINDINGS: Focal ultrasound of the areas of concern in the left upper arm and forearm demonstrate no discrete soft tissue mass or fluid collection. IMPRESSION: 1. Negative. No sonographic abnormality at the sites of the palpable abnormalities confirmed by the patient. Electronically Signed   By: Titus Dubin M.D.   On: 11/19/2021 13:35      Assessment/Plan 1.  Cellulitis   2. *** 3. ***    Thank you for allowing [place] to participate in the care of this patient.   Kris Hartmann, NP [facility] [phone]  (Office Phone) [phone] (Office Fax) [phone] (Pager)  12/14/2021 11:41 PM  Staff may message me via secure chat in [place]  but this may not receive immediate response,  please page for urgent matters!  Dictation software was used to generate the above note. Typos may occur and escape review, as with typed/written notes. Any error is purely unintentional.  Please contact me directly for clarity if needed.

## 2021-12-14 NOTE — Progress Notes (Signed)
Progress Note    Katie [name]  ZRA:[id number] DOB: 1956-01-14  DOA: 12/11/2021 PCP: Merryl Hacker, No      Brief Narrative:    Medical records reviewed and are as summarized below:  Katie [name] is a 66 y.o. female with medical history significant for ESRD on hemodialysis, type II DM, hypertension, homelessness, sepsis from pneumonia in June 2022, left upper extremity cellulitis in June 2023, dog bite infection left upper extremity June 2023, chronic disease, aortic stenosis, fecal impaction, recent discharge from the hospital on 11/26/2021.  She has an old left arm fistula packed she has been getting hemodialysis through a right upper chest wall permacath.   She was found to have left upper extremity cellulitis.     Assessment/Plan:   Principal Problem:   Cellulitis of left upper extremity Active Problems:   ESRD (end stage renal disease) ([place])   Hypertension   Anemia in ESRD (end-stage renal disease) (HCC)   Elevated lactic acid level   Chronic diastolic CHF (congestive heart failure) (HCC)   Depression, unspecified   Type II diabetes mellitus with renal manifestations (HCC)    Body mass index is 26.79 kg/m.  Recurrent left upper extremity cellulitis: IV antibiotics have been de-escalated from vancomycin, ceftriaxone and Flagyl to IV cefazolin.  Follow-up with ID for further recommendations.  She was recently treated with 10-day course of doxycycline and Keflex (completed on 11/18/2021) for left upper extremity cellulitis.  No evidence of DVT or abscess in left upper extremity.  Vascular surgeon to interrogate left AVG.  ESRD, anemia of chronic kidney disease, secondary hyperparathyroidism: Follow-up with nephrologist for hemodialysis.  Chronic diastolic CHF: Compensated  Lactic acidosis: Resolved  Other comorbidities include depression, hypertension,  Diet Order             Diet renal with fluid restriction Fluid restriction: 1200 mL Fluid; Room service appropriate?  Yes; Fluid consistency: Thin  Diet effective now                            Consultants: Nephrologist, ID specialist  Procedures: None    Medications:    carvedilol  6.25 mg Oral QPM   Chlorhexidine Gluconate Cloth  6 each Topical Q0600   citalopram  10 mg Oral Daily   furosemide  40 mg Oral Daily   heparin  5,000 Units Subcutaneous Q8H   pantoprazole  40 mg Oral BID   traZODone  50 mg Oral QHS   Continuous Infusions:   ceFAZolin (ANCEF) IV 1 g (12/14/21 1049)     Anti-infectives (From admission, onward)    Start     Dose/Rate Route Frequency Ordered Stop   12/16/21 1200  vancomycin (VANCOREADY) IVPB 750 mg/150 mL  Status:  Discontinued        750 mg 150 mL/hr over 60 Minutes Intravenous Every T-Th-Sa (Hemodialysis) 12/13/21 1235 12/13/21 2135   12/14/21 1000  ceFAZolin (ANCEF) IVPB 1 g/50 mL premix        1 g 100 mL/hr over 30 Minutes Intravenous Daily 12/13/21 2131     12/13/21 1300  vancomycin (VANCOREADY) IVPB 750 mg/150 mL        750 mg 150 mL/hr over 60 Minutes Intravenous  Once 12/13/21 1235 12/14/21 0736   12/13/21 1000  cefTRIAXone (ROCEPHIN) 2 g in sodium chloride 0.9 % 100 mL IVPB  Status:  Discontinued        2 g 200 mL/hr over 30 Minutes Intravenous Every  24 hours 12/12/21 1502 12/13/21 2130   12/12/21 1200  cefTRIAXone (ROCEPHIN) 1 g in sodium chloride 0.9 % 100 mL IVPB  Status:  Discontinued        1 g 200 mL/hr over 30 Minutes Intravenous Every 24 hours 12/11/21 1424 12/12/21 1502   12/12/21 1200  vancomycin (VANCOREADY) IVPB 750 mg/150 mL  Status:  Discontinued        750 mg 150 mL/hr over 60 Minutes Intravenous Every T-Th-Sa (Hemodialysis) 12/11/21 1428 12/13/21 1235   12/11/21 2200  metroNIDAZOLE (FLAGYL) IVPB 500 mg  Status:  Discontinued        500 mg 100 mL/hr over 60 Minutes Intravenous Every 12 hours 12/11/21 1424 12/13/21 2130   12/11/21 1430  cefTRIAXone (ROCEPHIN) 1 g in sodium chloride 0.9 % 100 mL IVPB  Status:   Discontinued        1 g 200 mL/hr over 30 Minutes Intravenous Every 24 hours 12/11/21 1423 12/11/21 1424   12/11/21 1130  ceFEPIme (MAXIPIME) 2 g in sodium chloride 0.9 % 100 mL IVPB        2 g 200 mL/hr over 30 Minutes Intravenous  Once 12/11/21 1118 12/11/21 1529   12/11/21 1130  vancomycin (VANCOREADY) IVPB 1500 mg/300 mL        1,500 mg 150 mL/hr over 120 Minutes Intravenous  Once 12/11/21 1118 12/11/21 1845   12/11/21 1045  metroNIDAZOLE (FLAGYL) IVPB 500 mg        500 mg 100 mL/hr over 60 Minutes Intravenous  Once 12/11/21 1032 12/11/21 1200              Family Communication/Anticipated D/C date and plan/Code Status   DVT prophylaxis: heparin injection 5,000 Units Start: 12/11/21 1400     Code Status: Full Code  Family Communication: None Disposition Plan: Plan to discharge home in 2 to 3 days   Status is: Inpatient Remains inpatient appropriate because: IV antibiotics for left upper extremity cellulitis      Subjective:   She complains of pain and swelling in the left forearm  Objective:    Vitals:   12/13/21 2013 12/14/21 0255 12/14/21 0409 12/14/21 0808  BP: (!) 156/70  (!) 165/72 135/75  Pulse: 64  75 74  Resp: 18  16 16   Temp: 98.1 F (36.7 C)  98.4 F (36.9 C) 98.5 F (36.9 C)  TempSrc:    Oral  SpO2: 97%  97% 99%  Weight:  75.3 kg    Height:       No data found.   Intake/Output Summary (Last 24 hours) at 12/14/2021 1216 Last data filed at 12/14/2021 0255 Gross per 24 hour  Intake 759.93 ml  Output 800 ml  Net -40.07 ml   Filed Weights   12/13/21 1137 12/13/21 1501 12/14/21 0255  Weight: 76.2 kg 76.2 kg 75.3 kg    Exam:  GEN: NAD SKIN: Warm and dry EYES: No pallor or icterus ENT: MMM CV: RRR PULM: CTA B ABD: soft, obese, NT, +BS CNS: AAO x 3, non focal EXT: Swelling, erythema and tenderness of the left forearm, some tenderness over the anterior left arm     Data Reviewed:   I have personally reviewed following labs  and imaging studies:  Labs: Labs show the following:   Basic Metabolic Panel: Recent Labs  Lab 12/11/21 1021 12/12/21 0439  NA 139 137  K 4.5 4.5  CL 103 103  CO2 26 24  GLUCOSE 157* 93  BUN 38* 44*  CREATININE 6.81* 6.79*  CALCIUM 8.5* 8.3*  PHOS  --  5.6*   GFR Estimated Creatinine Clearance: 8.8 mL/min (A) (by C-G formula based on SCr of 6.79 mg/dL (H)). Liver Function Tests: Recent Labs  Lab 12/11/21 1021  AST 20  ALT 17  ALKPHOS 83  BILITOT 0.7  PROT 5.9*  ALBUMIN 2.8*   No results for input(s): "LIPASE", "AMYLASE" in the last 168 hours. No results for input(s): "AMMONIA" in the last 168 hours. Coagulation profile Recent Labs  Lab 12/11/21 1021  INR 1.1    CBC: Recent Labs  Lab 12/11/21 1021  WBC 6.4  NEUTROABS 3.8  HGB 10.0*  HCT 32.8*  MCV 97.9  PLT 219   Cardiac Enzymes: No results for input(s): "CKTOTAL", "CKMB", "CKMBINDEX", "TROPONINI" in the last 168 hours. BNP (last 3 results) No results for input(s): "PROBNP" in the last 8760 hours. CBG: Recent Labs  Lab 12/13/21 0905  GLUCAP 104*   D-Dimer: No results for input(s): "DDIMER" in the last 72 hours. Hgb A1c: No results for input(s): "HGBA1C" in the last 72 hours. Lipid Profile: No results for input(s): "CHOL", "HDL", "LDLCALC", "TRIG", "CHOLHDL", "LDLDIRECT" in the last 72 hours. Thyroid function studies: No results for input(s): "TSH", "T4TOTAL", "T3FREE", "THYROIDAB" in the last 72 hours.  Invalid input(s): "FREET3" Anemia work up: No results for input(s): "VITAMINB12", "FOLATE", "FERRITIN", "TIBC", "IRON", "RETICCTPCT" in the last 72 hours. Sepsis Labs: Recent Labs  Lab 12/11/21 1021 12/11/21 1943  PROCALCITON 0.14  --   WBC 6.4  --   LATICACIDVEN 2.2* 0.9    Microbiology Recent Results (from the past 240 hour(s))  Blood Culture (routine x 2)     Status: None (Preliminary result)   Collection Time: 12/11/21 10:21 AM   Specimen: BLOOD  Result Value Ref Range Status    Specimen Description BLOOD LEFT [facility]  Final   Special Requests   Final    BOTTLES DRAWN AEROBIC AND ANAEROBIC Blood Culture adequate volume   Culture   Final    NO GROWTH 3 DAYS Performed at [facility], [address], [place], [place] [address]    Report Status PENDING  Incomplete  Blood Culture (routine x 2)     Status: None (Preliminary result)   Collection Time: 12/11/21 10:21 AM   Specimen: BLOOD  Result Value Ref Range Status   Specimen Description BLOOD RIGHT ARM  Final   Special Requests   Final    BOTTLES DRAWN AEROBIC AND ANAEROBIC Blood Culture results may not be optimal due to an excessive volume of blood received in culture bottles   Culture   Final    NO GROWTH 3 DAYS Performed at [facility], [address], [place], [place] [address]    Report Status PENDING  Incomplete  Urine Culture     Status: Abnormal   Collection Time: 12/11/21  5:36 PM   Specimen: Urine, Random  Result Value Ref Range Status   Specimen Description   Final    URINE, RANDOM Performed at [facility], [address] [place], [place] [address]    Special Requests   Final    NONE Performed at [facility], [address] [place], [place] [address]    Culture >=100,000 COLONIES/mL PROTEUS MIRABILIS (A)  Final   Report Status 12/14/2021 FINAL  Final   Organism ID, Bacteria PROTEUS MIRABILIS (A)  Final      Susceptibility   Proteus mirabilis - MIC*    AMPICILLIN <=2  SENSITIVE Sensitive     CEFAZOLIN 8 SENSITIVE Sensitive     CEFEPIME <=0.12 SENSITIVE Sensitive     CEFTRIAXONE <=0.25 SENSITIVE Sensitive     CIPROFLOXACIN 1 RESISTANT Resistant     GENTAMICIN <=1 SENSITIVE Sensitive     IMIPENEM 1 SENSITIVE Sensitive     NITROFURANTOIN 128 RESISTANT Resistant     TRIMETH/SULFA <=20 SENSITIVE Sensitive     AMPICILLIN/SULBACTAM <=2 SENSITIVE Sensitive     PIP/TAZO <=4 SENSITIVE Sensitive     * >=100,000 COLONIES/mL PROTEUS MIRABILIS     Procedures and diagnostic studies:  MR FOREARM LEFT W WO CONTRAST  Result Date: 12/13/2021 CLINICAL DATA:  Soft tissue mass, forearm, deep; Soft tissue mass, upper arm, deep EXAM: MRI OF THE LEFT FOREARM WITHOUT AND WITH CONTRAST; MRI OF THE LEFT HUMERUS WITHOUT AND WITH CONTRAST TECHNIQUE: Multiplanar, multisequence MR imaging of the left forearm and left humerus was performed before and after the administration of intravenous contrast. CONTRAST:  22mL GADAVIST GADOBUTROL 1 MMOL/ML IV SOLN COMPARISON:  None Available. FINDINGS: Bones/Joint/Cartilage There is no significant marrow signal alteration. The cortex is intact. No aggressive bone lesion. There is a trace elbow joint effusion. Muscles and Tendons There is generalized loss of muscle bulk. There is intramuscular edema within the brachialis, brachioradialis, and involving the flexor compartment musculature of the forearm with mild interfascial edema. There is no intramuscular collection. Soft tissues There is extensive skin thickening and subcutaneous soft tissue swelling of the left upper extremity, extending from the upper arm through the forearm, and worst along the forearm. There is no well-defined/drainable fluid collection. IMPRESSION: Findings are most consistent with left upper extremity cellulitis with myofasciitis in the distal upper arm and flexor compartment of the forearm. No evidence of osteomyelitis or soft tissue abscess. Trace elbow joint effusion without other convincing findings to suggest septic arthritis. Electronically Signed   By: Maurine Simmering M.D.   On: 12/13/2021 08:28   MR HUMERUS LEFT W WO CONTRAST  Result Date: 12/13/2021 CLINICAL DATA:  Soft tissue mass, forearm, deep; Soft tissue mass, upper arm, deep EXAM: MRI OF THE LEFT FOREARM WITHOUT AND WITH CONTRAST; MRI OF THE LEFT HUMERUS WITHOUT AND WITH CONTRAST TECHNIQUE: Multiplanar, multisequence MR imaging of the left forearm and left humerus was performed before and after  the administration of intravenous contrast. CONTRAST:  68mL GADAVIST GADOBUTROL 1 MMOL/ML IV SOLN COMPARISON:  None Available. FINDINGS: Bones/Joint/Cartilage There is no significant marrow signal alteration. The cortex is intact. No aggressive bone lesion. There is a trace elbow joint effusion. Muscles and Tendons There is generalized loss of muscle bulk. There is intramuscular edema within the brachialis, brachioradialis, and involving the flexor compartment musculature of the forearm with mild interfascial edema. There is no intramuscular collection. Soft tissues There is extensive skin thickening and subcutaneous soft tissue swelling of the left upper extremity, extending from the upper arm through the forearm, and worst along the forearm. There is no well-defined/drainable fluid collection. IMPRESSION: Findings are most consistent with left upper extremity cellulitis with myofasciitis in the distal upper arm and flexor compartment of the forearm. No evidence of osteomyelitis or soft tissue abscess. Trace elbow joint effusion without other convincing findings to suggest septic arthritis. Electronically Signed   By: Maurine Simmering M.D.   On: 12/13/2021 08:28               LOS: 3 days   Katie [name]  [facility]   Pager on [email or web address]. If 7PM-7AM, please contact night-coverage at  [email or web address]     12/14/2021, 12:16 PM

## 2021-12-14 NOTE — Progress Notes (Signed)
ID Pt is stable Still has swelling/pain left fore arm No fever  O/e awake and alert BP (!) 104/45 (BP Location: Right Arm)   Pulse 65   Temp 98 F (36.7 C)   Resp 17   Ht 5\' 6"  (1.676 m)   Wt 75.3 kg   SpO2 96%   BMI 26.79 kg/m   Chest b/l ai entry [id number] And soft Rt IJ HD Left axiall no lymphadenitis Left breast no mass Left AVG  Swelling of lower third of upper arm and forearm on the left side Erythema and tenderness     Labs    Latest Ref Rng & Units 12/11/2021   10:21 AM 11/25/2021    8:00 AM 11/22/2021    8:50 AM  CBC  WBC 4.0 - 10.5 K/uL 6.4  7.9  7.3   Hemoglobin 12.0 - 15.0 g/dL 10.0  10.2  10.3   Hematocrit 36.0 - 46.0 % 32.8  32.8  33.9   Platelets 150 - 400 K/uL 219  230  281        Latest Ref Rng & Units 12/12/2021    4:39 AM 12/11/2021   10:21 AM 11/25/2021    8:00 AM  CMP  Glucose 70 - 99 mg/dL 93  157  105   BUN 8 - 23 mg/dL 44  38  73   Creatinine 0.44 - 1.00 mg/dL 6.79  6.81  7.21   Sodium 135 - 145 mmol/L 137  139  135   Potassium 3.5 - 5.1 mmol/L 4.5  4.5  4.4   Chloride 98 - 111 mmol/L 103  103  102   CO2 22 - 32 mmol/L 24  26  25    Calcium 8.9 - 10.3 mg/dL 8.3  8.5  8.6   Total Protein 6.5 - 8.1 g/dL  5.9  6.0   Total Bilirubin 0.3 - 1.2 mg/dL  0.7  0.6   Alkaline Phos 38 - 126 U/L  83  84   AST 15 - 41 U/L  20  19   ALT 0 - 44 U/L  17  15     Micro 12/11/21 BC- Neg  UC- proteus  IMAGING RESULTS: MRI - of the left arm- no abscess I have personally reviewed the films  Impression/recommendation  Swelling left forearm and lower third of upper arm with pain and erythema- looks like lymphedema/venous edema No response to antibiotics- currently on cefazolin The AVG could have stenosis or poor flow Need to interrogate the fistula Also need to r/o AVG infeciton with a tagged wbc scan as suggested by vascular Doppler did not show any thrombus MRI no abscess  She had a dog bite in may 2023 to the left finger and that was treated and  completely resolved But now she has cellulitis recurrent X 3 rd episode in 3 months  Keep the arm elevated   ESRD on dialysis Proteus in the urine-not sure whether this is just colonization- cefazolin treats this anyway   HTN on coreg   Anemia due to ESRD     Discussed the management with the patient, nephrologist and hospitalist [facility] is covering Friday/sat/Sunday Dr.Snider available by phone for urgent issues

## 2021-12-15 DIAGNOSIS — L03114 Cellulitis of left upper limb: Secondary | ICD-10-CM | POA: Diagnosis not present

## 2021-12-15 DIAGNOSIS — N186 End stage renal disease: Secondary | ICD-10-CM | POA: Diagnosis not present

## 2021-12-15 LAB — RENAL FUNCTION PANEL
Albumin: 2.7 g/dL — ABNORMAL LOW (ref 3.5–5.0)
Anion gap: 9 (ref 5–15)
BUN: 36 mg/dL — ABNORMAL HIGH (ref 8–23)
CO2: 27 mmol/L (ref 22–32)
Calcium: 8.3 mg/dL — ABNORMAL LOW (ref 8.9–10.3)
Chloride: 102 mmol/L (ref 98–111)
Creatinine, Ser: 4.61 mg/dL — ABNORMAL HIGH (ref 0.44–1.00)
GFR, Estimated: 10 mL/min — ABNORMAL LOW (ref >60)
Glucose, Bld: 104 mg/dL — ABNORMAL HIGH (ref 70–99)
Phosphorus: 4.7 mg/dL — ABNORMAL HIGH (ref 2.5–4.6)
Potassium: 3.9 mmol/L (ref 3.5–5.1)
Sodium: 138 mmol/L (ref 135–145)

## 2021-12-15 MED ORDER — BISACODYL 5 MG PO TBEC
5.0000 mg | DELAYED_RELEASE_TABLET | Freq: Every day | ORAL | Status: DC | PRN
Start: 2021-12-15 — End: 2021-12-25
  Administered 2021-12-15 – 2021-12-24 (×8): 5 mg via ORAL
  Filled 2021-12-15 (×9): qty 1

## 2021-12-15 NOTE — Progress Notes (Signed)
[facility]  ROUNDING NOTE   Subjective:   Katie [name] is a 66 year old female with past medical history of ESRD with hemodialysis TTS, diabetes mellitus, and hypertension. She presents to ED with worsening left lower arm swelling and redness. Patient has been admitted for ESRD (end stage renal disease) on dialysis ([place]) [N18.6, Z99.2] Cellulitis of left upper extremity [L03.114] Left arm cellulitis [I09.735]  Patient is known to our practice and receives outpatient dialysis treatments at [facility] on a TTS schedule, supervised by Dr. Holley Raring.  Patient missed treatment on Saturday due to transportation issues.    Patient seen resting in bed Alert and oriented Denies pain or discomfort Tolerating meals without nausea and vomiting Remains on room air Left arm remains edematous and sore.    Objective:  Vital signs in last 24 hours:  Temp:  [97.5 F (36.4 C)-99.1 F (37.3 C)] 98.8 F (37.1 C) (08/04 1131) Pulse Rate:  [65-78] 70 (08/04 1131) Resp:  [16-17] 16 (08/04 1131) BP: (104-182)/(45-79) 182/79 (08/04 1131) SpO2:  [96 %-100 %] 98 % (08/04 1131)  Weight change:  Filed Weights   12/13/21 1137 12/13/21 1501 12/14/21 0255  Weight: 76.2 kg 76.2 kg 75.3 kg    Intake/Output: I/O last 3 completed shifts: In: 57 [IV Piggyback:50] Out: 500 [Urine:500]   Intake/Output this shift:  Total I/O In: 100 [P.O.:100] Out: 200 [Urine:200]  Physical Exam: General: NAD  Head: Normocephalic, atraumatic. Moist oral mucosal membranes  Eyes: Anicteric  Lungs:  Clear to auscultation, normal effort, room air  Heart: Regular rate and rhythm  Abdomen:  Soft, nontender  Extremities: 1+ peripheral edema.  Left upper extremity 3+ edema  Neurologic: Nonfocal, moving all four extremities  Skin: No lesions left upper extremity erythema  Access: Right chest PermCath    Basic Metabolic Panel: Recent Labs  Lab 12/11/21 1021 12/12/21 0439 12/15/21 0931  NA 139 137  138  K 4.5 4.5 3.9  CL 103 103 102  CO2 26 24 27   GLUCOSE 157* 93 104*  BUN 38* 44* 36*  CREATININE 6.81* 6.79* 4.61*  CALCIUM 8.5* 8.3* 8.3*  PHOS  --  5.6* 4.7*     Liver Function Tests: Recent Labs  Lab 12/11/21 1021 12/15/21 0931  AST 20  --   ALT 17  --   ALKPHOS 83  --   BILITOT 0.7  --   PROT 5.9*  --   ALBUMIN 2.8* 2.7*    No results for input(s): "LIPASE", "AMYLASE" in the last 168 hours. No results for input(s): "AMMONIA" in the last 168 hours.  CBC: Recent Labs  Lab 12/11/21 1021  WBC 6.4  NEUTROABS 3.8  HGB 10.0*  HCT 32.8*  MCV 97.9  PLT 219     Cardiac Enzymes: No results for input(s): "CKTOTAL", "CKMB", "CKMBINDEX", "TROPONINI" in the last 168 hours.  BNP: Invalid input(s): "POCBNP"  CBG: Recent Labs  Lab 12/13/21 0905  GLUCAP 104*     Microbiology: Results for orders placed or performed during the hospital encounter of 12/11/21  Blood Culture (routine x 2)     Status: None (Preliminary result)   Collection Time: 12/11/21 10:21 AM   Specimen: BLOOD  Result Value Ref Range Status   Specimen Description BLOOD LEFT [facility]  Final   Special Requests   Final    BOTTLES DRAWN AEROBIC AND ANAEROBIC Blood Culture adequate volume   Culture   Final    NO GROWTH 4 DAYS Performed at [facility], [address] [place], [place] [address]    Report Status PENDING  Incomplete  Blood Culture (routine x 2)     Status: None (Preliminary result)   Collection Time: 12/11/21 10:21 AM   Specimen: BLOOD  Result Value Ref Range Status   Specimen Description BLOOD RIGHT ARM  Final   Special Requests   Final    BOTTLES DRAWN AEROBIC AND ANAEROBIC Blood Culture results may not be optimal due to an excessive volume of blood received in culture bottles   Culture   Final    NO GROWTH 4 DAYS Performed at [facility], [address] [place], [place] [address]    Report Status PENDING  Incomplete  Urine Culture     Status: Abnormal    Collection Time: 12/11/21  5:36 PM   Specimen: Urine, Random  Result Value Ref Range Status   Specimen Description   Final    URINE, RANDOM Performed at [facility], [address], [place], [place] [address]    Special Requests   Final    NONE Performed at [facility], [address], [place], [place] [address]    Culture >=100,000 COLONIES/mL PROTEUS MIRABILIS (A)  Final   Report Status 12/14/2021 FINAL  Final   Organism ID, Bacteria PROTEUS MIRABILIS (A)  Final      Susceptibility   Proteus mirabilis - MIC*    AMPICILLIN <=2 SENSITIVE Sensitive     CEFAZOLIN 8 SENSITIVE Sensitive     CEFEPIME <=0.12 SENSITIVE Sensitive     CEFTRIAXONE <=0.25 SENSITIVE Sensitive     CIPROFLOXACIN 1 RESISTANT Resistant     GENTAMICIN <=1 SENSITIVE Sensitive     IMIPENEM 1 SENSITIVE Sensitive     NITROFURANTOIN 128 RESISTANT Resistant     TRIMETH/SULFA <=20 SENSITIVE Sensitive     AMPICILLIN/SULBACTAM <=2 SENSITIVE Sensitive     PIP/TAZO <=4 SENSITIVE Sensitive     * >=100,000 COLONIES/mL PROTEUS MIRABILIS    Coagulation Studies: No results for input(s): "LABPROT", "INR" in the last 72 hours.   Urinalysis: No results for input(s): "COLORURINE", "LABSPEC", "PHURINE", "GLUCOSEU", "HGBUR", "BILIRUBINUR", "KETONESUR", "PROTEINUR", "UROBILINOGEN", "NITRITE", "LEUKOCYTESUR" in the last 72 hours.  Invalid input(s): "APPERANCEUR"     Imaging: No results found.   Medications:     ceFAZolin (ANCEF) IV 1 g (12/15/21 1118)    carvedilol  6.25 mg Oral QPM   Chlorhexidine Gluconate Cloth  6 each Topical Q0600   citalopram  10 mg Oral Daily   furosemide  40 mg Oral Daily   heparin  5,000 Units Subcutaneous Q8H   pantoprazole  40 mg Oral BID   traZODone  50 mg Oral QHS   acetaminophen, bisacodyl, diphenhydrAMINE, guaiFENesin-dextromethorphan, hydrALAZINE, HYDROmorphone (DILAUDID) injection, ondansetron (ZOFRAN) IV, ondansetron (ZOFRAN) IV, oxyCODONE-acetaminophen,  senna-docusate, tiZANidine, topiramate  Assessment/ Plan:  [name] is a 66 y.o.  female   with past medical history of ESRD with hemodialysis TTS, diabetes mellitus, and hypertension. She presents to ED with worsening left lower arm swelling and redness. Patient has been admitted for ESRD (end stage renal disease) on dialysis ([place]) [N18.6, Z99.2] Cellulitis of left upper extremity [L03.114] Left arm cellulitis [T46.568]   [facility]/TTS/right PermCath/83.5 kg  End-stage renal disease on hemodialysis.  Will maintain outpatient schedule if possible.  Next treatment scheduled for Saturday.     2. Anemia of chronic kidney disease Lab Results  Component Value Date   HGB 10.0 (L) 12/11/2021    Patient receives [place] outpatient.  Hemoglobin at  goal  3. Secondary Hyperparathyroidism:  Lab Results  Component Value Date   PTH 70 (H) 05/12/2020   CALCIUM 8.3 (L) 12/15/2021   PHOS 4.7 (H) 12/15/2021  Calcium and phosphorus within acceptable range.  We will continue to monitor  4.  Hypertension with chronic kidney disease.  Home regimen includes carvedilol and furosemide 40 mg daily.  Currently receiving these inpatient.  Blood pressure 182/79  5.  Left arm cellulitis evidenced by edema, erythema, warm to touch.  Likely secondary to wound infection.  MRI with contrast completed on 12/12/2021 confirming cellulitis, no osteomyelitis noted.  Vancomycin with dialysis treatments. Obtaining WBC scan for further evaluation.    LOS: 4 Katie [name] 8/4/20231:58 PM

## 2021-12-15 NOTE — Progress Notes (Signed)
Progress Note    Katie [name]  ZOX:[id number] DOB: 1956/04/18  DOA: 12/11/2021 PCP: Merryl Hacker, No      Brief Narrative:    Medical records reviewed and are as summarized below:  [name] is a 66 y.o. female with medical history significant for ESRD on hemodialysis, type II DM, hypertension, homelessness, sepsis from pneumonia in June 2022, left upper extremity cellulitis in June 2023, dog bite infection left upper extremity June 2023, chronic disease, aortic stenosis, fecal impaction, recent discharge from the hospital on 11/26/2021.  She has an old left arm fistula packed she has been getting hemodialysis through a right upper chest wall permacath.   She was found to have left upper extremity cellulitis.     Assessment/Plan:   Principal Problem:   Cellulitis of left upper extremity Active Problems:   ESRD (end stage renal disease) ([facility])   Hypertension   Anemia in ESRD (end-stage renal disease) (HCC)   Elevated lactic acid level   Chronic diastolic CHF (congestive heart failure) (HCC)   Depression, unspecified   Type II diabetes mellitus with renal manifestations (HCC)    Body mass index is 26.79 kg/m.  Recurrent left upper extremity cellulitis: Vascular surgery recommended tagged WBC scan to evaluate left arm AV graft for infection.  Continue IV cefazolin.   She was recently treated with 10-day course of doxycycline and Keflex (completed on 11/18/2021) for left upper extremity cellulitis.  No evidence of DVT or abscess in left upper extremity.   ESRD, anemia of chronic kidney disease, secondary hyperparathyroidism: Follow-up with nephrologist for hemodialysis.  Chronic diastolic CHF: Compensated  Lactic acidosis: Resolved  Other comorbidities include depression, hypertension,  Diet Order             Diet renal with fluid restriction Fluid restriction: 1200 mL Fluid; Room service appropriate? Yes; Fluid consistency: Thin  Diet effective now                             Consultants: Nephrologist, ID specialist, vascular surgeon  Procedures: None    Medications:    carvedilol  6.25 mg Oral QPM   Chlorhexidine Gluconate Cloth  6 each Topical Q0600   citalopram  10 mg Oral Daily   furosemide  40 mg Oral Daily   heparin  5,000 Units Subcutaneous Q8H   pantoprazole  40 mg Oral BID   traZODone  50 mg Oral QHS   Continuous Infusions:   ceFAZolin (ANCEF) IV 1 g (12/15/21 1118)     Anti-infectives (From admission, onward)    Start     Dose/Rate Route Frequency Ordered Stop   12/16/21 1200  vancomycin (VANCOREADY) IVPB 750 mg/150 mL  Status:  Discontinued        750 mg 150 mL/hr over 60 Minutes Intravenous Every T-Th-Sa (Hemodialysis) 12/13/21 1235 12/13/21 2135   12/14/21 1000  ceFAZolin (ANCEF) IVPB 1 g/50 mL premix        1 g 100 mL/hr over 30 Minutes Intravenous Daily 12/13/21 2131     12/13/21 1300  vancomycin (VANCOREADY) IVPB 750 mg/150 mL        750 mg 150 mL/hr over 60 Minutes Intravenous  Once 12/13/21 1235 12/14/21 0736   12/13/21 1000  cefTRIAXone (ROCEPHIN) 2 g in sodium chloride 0.9 % 100 mL IVPB  Status:  Discontinued        2 g 200 mL/hr over 30 Minutes Intravenous Every 24 hours 12/12/21 1502 12/13/21  2130   12/12/21 1200  cefTRIAXone (ROCEPHIN) 1 g in sodium chloride 0.9 % 100 mL IVPB  Status:  Discontinued        1 g 200 mL/hr over 30 Minutes Intravenous Every 24 hours 12/11/21 1424 12/12/21 1502   12/12/21 1200  vancomycin (VANCOREADY) IVPB 750 mg/150 mL  Status:  Discontinued        750 mg 150 mL/hr over 60 Minutes Intravenous Every T-Th-Sa (Hemodialysis) 12/11/21 1428 12/13/21 1235   12/11/21 2200  metroNIDAZOLE (FLAGYL) IVPB 500 mg  Status:  Discontinued        500 mg 100 mL/hr over 60 Minutes Intravenous Every 12 hours 12/11/21 1424 12/13/21 2130   12/11/21 1430  cefTRIAXone (ROCEPHIN) 1 g in sodium chloride 0.9 % 100 mL IVPB  Status:  Discontinued        1 g 200 mL/hr over 30 Minutes Intravenous  Every 24 hours 12/11/21 1423 12/11/21 1424   12/11/21 1130  ceFEPIme (MAXIPIME) 2 g in sodium chloride 0.9 % 100 mL IVPB        2 g 200 mL/hr over 30 Minutes Intravenous  Once 12/11/21 1118 12/11/21 1529   12/11/21 1130  vancomycin (VANCOREADY) IVPB 1500 mg/300 mL        1,500 mg 150 mL/hr over 120 Minutes Intravenous  Once 12/11/21 1118 12/11/21 1845   12/11/21 1045  metroNIDAZOLE (FLAGYL) IVPB 500 mg        500 mg 100 mL/hr over 60 Minutes Intravenous  Once 12/11/21 1032 12/11/21 1200              Family Communication/Anticipated D/C date and plan/Code Status   DVT prophylaxis: heparin injection 5,000 Units Start: 12/11/21 1400     Code Status: Full Code  Family Communication: None Disposition Plan: Plan to discharge home in 2 to 3 days   Status is: Inpatient Remains inpatient appropriate because: IV antibiotics for left upper extremity cellulitis      Subjective:   Interval events noted.  She still has significant pain and swelling in the left forearm.  She also complains of constipation and requested  a laxative.  Objective:    Vitals:   12/14/21 1938 12/14/21 2341 12/15/21 0514 12/15/21 0734  BP: (!) 104/45 (!) 126/52 (!) 173/70 (!) 181/72  Pulse: 65 69 78 72  Resp: 17 17 17 16   Temp: 98 F (36.7 C) 98.1 F (36.7 C) 98.5 F (36.9 C) 99.1 F (37.3 C)  TempSrc:      SpO2: 96% 97% 99% 97%  Weight:      Height:       No data found.   Intake/Output Summary (Last 24 hours) at 12/15/2021 1129 Last data filed at 12/15/2021 0900 Gross per 24 hour  Intake 150 ml  Output 400 ml  Net -250 ml   Filed Weights   12/13/21 1137 12/13/21 1501 12/14/21 0255  Weight: 76.2 kg 76.2 kg 75.3 kg    Exam:  GEN: NAD SKIN: Warm and dry EYES: No pallor or icterus ENT: MMM CV: RRR PULM: CTA B ABD: soft, obese, NT, +BS CNS: AAO x 3, non focal EXT: Left anterior arm tenderness.  Swelling, erythema and tenderness of the left forearm.  Left forearm is warm to  touch      Data Reviewed:   I have personally reviewed following labs and imaging studies:  Labs: Labs show the following:   Basic Metabolic Panel: Recent Labs  Lab 12/11/21 1021 12/12/21 0439 12/15/21 0931  NA  139 137 138  K 4.5 4.5 3.9  CL 103 103 102  CO2 26 24 27   GLUCOSE 157* 93 104*  BUN 38* 44* 36*  CREATININE 6.81* 6.79* 4.61*  CALCIUM 8.5* 8.3* 8.3*  PHOS  --  5.6* 4.7*   GFR Estimated Creatinine Clearance: 13 mL/min (A) (by C-G formula based on SCr of 4.61 mg/dL (H)). Liver Function Tests: Recent Labs  Lab 12/11/21 1021 12/15/21 0931  AST 20  --   ALT 17  --   ALKPHOS 83  --   BILITOT 0.7  --   PROT 5.9*  --   ALBUMIN 2.8* 2.7*   No results for input(s): "LIPASE", "AMYLASE" in the last 168 hours. No results for input(s): "AMMONIA" in the last 168 hours. Coagulation profile Recent Labs  Lab 12/11/21 1021  INR 1.1    CBC: Recent Labs  Lab 12/11/21 1021  WBC 6.4  NEUTROABS 3.8  HGB 10.0*  HCT 32.8*  MCV 97.9  PLT 219   Cardiac Enzymes: No results for input(s): "CKTOTAL", "CKMB", "CKMBINDEX", "TROPONINI" in the last 168 hours. BNP (last 3 results) No results for input(s): "PROBNP" in the last 8760 hours. CBG: Recent Labs  Lab 12/13/21 0905  GLUCAP 104*   D-Dimer: No results for input(s): "DDIMER" in the last 72 hours. Hgb A1c: No results for input(s): "HGBA1C" in the last 72 hours. Lipid Profile: No results for input(s): "CHOL", "HDL", "LDLCALC", "TRIG", "CHOLHDL", "LDLDIRECT" in the last 72 hours. Thyroid function studies: No results for input(s): "TSH", "T4TOTAL", "T3FREE", "THYROIDAB" in the last 72 hours.  Invalid input(s): "FREET3" Anemia work up: No results for input(s): "VITAMINB12", "FOLATE", "FERRITIN", "TIBC", "IRON", "RETICCTPCT" in the last 72 hours. Sepsis Labs: Recent Labs  Lab 12/11/21 1021 12/11/21 1943  PROCALCITON 0.14  --   WBC 6.4  --   LATICACIDVEN 2.2* 0.9    Microbiology Recent Results (from  the past 240 hour(s))  Blood Culture (routine x 2)     Status: None (Preliminary result)   Collection Time: 12/11/21 10:21 AM   Specimen: BLOOD  Result Value Ref Range Status   Specimen Description BLOOD LEFT [facility]  Final   Special Requests   Final    BOTTLES DRAWN AEROBIC AND ANAEROBIC Blood Culture adequate volume   Culture   Final    NO GROWTH 4 DAYS Performed at [facility], [address] [place], [place] [address]    Report Status PENDING  Incomplete  Blood Culture (routine x 2)     Status: None (Preliminary result)   Collection Time: 12/11/21 10:21 AM   Specimen: BLOOD  Result Value Ref Range Status   Specimen Description BLOOD RIGHT ARM  Final   Special Requests   Final    BOTTLES DRAWN AEROBIC AND ANAEROBIC Blood Culture results may not be optimal due to an excessive volume of blood received in culture bottles   Culture   Final    NO GROWTH 4 DAYS Performed at [facility], [address] [place], [place] [address]    Report Status PENDING  Incomplete  Urine Culture     Status: Abnormal   Collection Time: 12/11/21  5:36 PM   Specimen: Urine, Random  Result Value Ref Range Status   Specimen Description   Final    URINE, RANDOM Performed at [facility], [address], [place], [place] [address]    Special Requests   Final    NONE Performed at [facility] [place], [name] [address]    Culture >=100,000 COLONIES/mL PROTEUS MIRABILIS (A)  Final   Report Status 12/14/2021 FINAL  Final   Organism ID, Bacteria PROTEUS MIRABILIS (A)  Final      Susceptibility   Proteus mirabilis - MIC*    AMPICILLIN <=2 SENSITIVE Sensitive     CEFAZOLIN 8 SENSITIVE Sensitive     CEFEPIME <=0.12 SENSITIVE Sensitive     CEFTRIAXONE <=0.25 SENSITIVE Sensitive     CIPROFLOXACIN 1 RESISTANT Resistant     GENTAMICIN <=1 SENSITIVE Sensitive     IMIPENEM 1 SENSITIVE Sensitive     NITROFURANTOIN 128 RESISTANT Resistant      TRIMETH/SULFA <=20 SENSITIVE Sensitive     AMPICILLIN/SULBACTAM <=2 SENSITIVE Sensitive     PIP/TAZO <=4 SENSITIVE Sensitive     * >=100,000 COLONIES/mL PROTEUS MIRABILIS    Procedures and diagnostic studies:  No results found.             LOS: 4 days   Hibah Odonnell  [facility]   Pager on [email or web address]. If 7PM-7AM, please contact night-coverage at [email or web address]     12/15/2021, 11:29 AM

## 2021-12-15 NOTE — Progress Notes (Signed)
[facility]) Hospital Liaison note:  This is a pending outpatient-based Palliative Care patient. Will continue to follow for disposition.  Please call with any outpatient palliative questions or concerns.  Thank you, Lorelee Market, LPN [facility] Liaison [phone]

## 2021-12-15 NOTE — Progress Notes (Signed)
[facility]  Daily Progress Note   Subjective  -   Patient agreeable to tagged white blood cell scan  Objective Vitals:   12/15/21 0514 12/15/21 0734 12/15/21 1131 12/15/21 2043  BP: (!) 173/70 (!) 181/72 (!) 182/79 93/62  Pulse: 78 72 70 68  Resp: 17 16 16 20   Temp: 98.5 F (36.9 C) 99.1 F (37.3 C) 98.8 F (37.1 C) 98.2 F (36.8 C)  TempSrc:      SpO2: 99% 97% 98% 97%  Weight:      Height:        Intake/Output Summary (Last 24 hours) at 12/15/2021 2250 Last data filed at 12/15/2021 0900 Gross per 24 hour  Intake 100 ml  Output 400 ml  Net -300 ml    PULM  CTAB CV  RRR VASC  2+ radial pulse palpable thrill in left upper extremity  Laboratory CBC    Component Value Date/Time   WBC 6.4 12/11/2021 1021   HGB 10.0 (L) 12/11/2021 1021   HCT 32.8 (L) 12/11/2021 1021   PLT 219 12/11/2021 1021    BMET    Component Value Date/Time   NA 138 12/15/2021 0931   K 3.9 12/15/2021 0931   CL 102 12/15/2021 0931   CO2 27 12/15/2021 0931   GLUCOSE 104 (H) 12/15/2021 0931   BUN 36 (H) 12/15/2021 0931   CREATININE 4.61 (H) 12/15/2021 0931   CALCIUM 8.3 (L) 12/15/2021 0931   GFRNONAA 10 (L) 12/15/2021 0931   GFRAA  01/19/2021 0833    SPECIMEN HEMOLYZED. HEMOLYSIS MAY AFFECT INTEGRITY OF RESULTS.    Assessment/Planning: Discussed study with nuclear medicine and we were able to order a tagged WBC scan.  This scan cannot be done on a day that the patient has dialysis.  As such, because she is a Tuesday Thursday Saturday dialysis patient we will plan for the study to be done on Monday.  Further decision about possible removal of her dialysis graft remain following the studies.   Kris Hartmann  12/15/2021, 10:50 PM

## 2021-12-15 NOTE — TOC Progression Note (Addendum)
Transition of Care [facility]) - Progression Note    Patient Details  Name: Katie [name] MRN: [id number] Date of Birth: 18-Mar-1956  Transition [facility]) CM/SW [place], LCSW Phone Number: 12/15/2021, 1:16 PM  Clinical Narrative:    Attempted calls to [facility] to inquire if they will need to come assess patient prior to DC back there, if they can take patient over the weekend, etc. Was able to get through to [facility] in [place] who states patient is a resident in their ALF. She stated all of their administrative staff is in a meeting and to call back shortly.   12:20, 12:43- Attempted calls to [facility] main line, no answer. Attempted call to [facility] with [facility], VM not set up.   1:15- Call to [facility] main line, spoke with Receptionist who stated staff is still in a meeting. Left message with Receptionist for [facility] requesting return call.  Per MD, patient will not be medically ready over the weekend, earliest would be Monday.   1:48- Call from [facility] with [facility] who stated she will come assess patient today to make sure she can come back Monday (or later next week depending on when medically ready).       Expected Discharge Plan and Services                                                 Social Determinants of Health (SDOH) Interventions    Readmission Risk Interventions    09/27/2021    1:44 PM 09/26/2021   12:46 PM 03/08/2021    2:49 PM  Readmission Risk Prevention Plan  Transportation Screening  Complete Complete  Palliative Care Screening Not Applicable    Medication Review (RN Care Manager)  Complete Complete  HRI or Home Care Consult   Complete  SW Recovery Care/Counseling Consult  Complete   Palliative Care Screening  Not Applicable   [place]  Not Applicable Complete

## 2021-12-16 DIAGNOSIS — L03114 Cellulitis of left upper limb: Secondary | ICD-10-CM | POA: Diagnosis not present

## 2021-12-16 DIAGNOSIS — I5032 Chronic diastolic (congestive) heart failure: Secondary | ICD-10-CM | POA: Diagnosis not present

## 2021-12-16 DIAGNOSIS — N186 End stage renal disease: Secondary | ICD-10-CM | POA: Diagnosis not present

## 2021-12-16 LAB — CULTURE, BLOOD (ROUTINE X 2)
Culture: NO GROWTH
Culture: NO GROWTH
Special Requests: ADEQUATE

## 2021-12-16 MED ORDER — ONDANSETRON HCL 4 MG/2ML IJ SOLN
INTRAMUSCULAR | Status: AC
  Filled 2021-12-16: qty 2

## 2021-12-16 MED ORDER — DIPHENHYDRAMINE HCL 50 MG/ML IJ SOLN
INTRAMUSCULAR | Status: AC
  Filled 2021-12-16: qty 1

## 2021-12-16 MED ORDER — HEPARIN SODIUM (PORCINE) 1000 UNIT/ML IJ SOLN
INTRAMUSCULAR | Status: AC
  Filled 2021-12-16: qty 10

## 2021-12-16 NOTE — Plan of Care (Signed)

## 2021-12-16 NOTE — Progress Notes (Signed)
[facility]  ROUNDING NOTE   Subjective:   Katie [name] is a 66 year old female with past medical history of ESRD with hemodialysis TTS, diabetes mellitus, and hypertension. She presents to ED with worsening left lower arm swelling and redness. Patient has been admitted for ESRD (end stage renal disease) on dialysis ([employer]) [N18.6, Z99.2] Cellulitis of left upper extremity [L03.114] Left arm cellulitis [I62.703]  Patient is known to our practice and receives outpatient dialysis treatments at [facility] on a TTS schedule, supervised by Dr. Holley Raring.  Patient missed treatment on Saturday 12/09/2021 due to transportation issues.    Patient seen resting in bed during HD this morning  Alert and oriented Denies pain or discomfort Tolerating meals without nausea and vomiting Remains on room air Left arm remains edematous and sore.     Objective:  Vital signs in last 24 hours:  Temp:  [98.2 F (36.8 C)-98.8 F (37.1 C)] 98.4 F (36.9 C) (08/05 0747) Pulse Rate:  [68-79] 73 (08/05 0830) Resp:  [13-20] 13 (08/05 0830) BP: (93-199)/(58-115) 199/78 (08/05 0830) SpO2:  [96 %-100 %] 100 % (08/05 0830) Weight:  [73 kg-77.1 kg] 73 kg (08/05 0757)  Weight change:  Filed Weights   12/14/21 0255 12/16/21 0500 12/16/21 0757  Weight: 75.3 kg 77.1 kg 73 kg    Intake/Output: I/O last 3 completed shifts: In: 100 [P.O.:100] Out: 400 [Urine:400]   Intake/Output this shift:  No intake/output data recorded.  Physical Exam: General: NAD  Head: Normocephalic, atraumatic. Moist oral mucosal membranes  Eyes: Anicteric  Lungs:  Clear to auscultation, normal effort, room air  Heart: Regular rate and rhythm  Abdomen:  Soft, nontender  Extremities: No lower extremity peripheral edema.  Left upper extremity 3+ edema  Neurologic: Nonfocal, moving all four extremities  Skin: No lesions left upper extremity erythema  Access: Right chest PermCath    Basic Metabolic Panel: Recent  Labs  Lab 12/11/21 1021 12/12/21 0439 12/15/21 0931  NA 139 137 138  K 4.5 4.5 3.9  CL 103 103 102  CO2 26 24 27   GLUCOSE 157* 93 104*  BUN 38* 44* 36*  CREATININE 6.81* 6.79* 4.61*  CALCIUM 8.5* 8.3* 8.3*  PHOS  --  5.6* 4.7*     Liver Function Tests: Recent Labs  Lab 12/11/21 1021 12/15/21 0931  AST 20  --   ALT 17  --   ALKPHOS 83  --   BILITOT 0.7  --   PROT 5.9*  --   ALBUMIN 2.8* 2.7*    No results for input(s): "LIPASE", "AMYLASE" in the last 168 hours. No results for input(s): "AMMONIA" in the last 168 hours.  CBC: Recent Labs  Lab 12/11/21 1021  WBC 6.4  NEUTROABS 3.8  HGB 10.0*  HCT 32.8*  MCV 97.9  PLT 219     Cardiac Enzymes: No results for input(s): "CKTOTAL", "CKMB", "CKMBINDEX", "TROPONINI" in the last 168 hours.  BNP: Invalid input(s): "POCBNP"  CBG: Recent Labs  Lab 12/13/21 0905  GLUCAP 104*     Microbiology: Results for orders placed or performed during the hospital encounter of 12/11/21  Blood Culture (routine x 2)     Status: None   Collection Time: 12/11/21 10:21 AM   Specimen: BLOOD  Result Value Ref Range Status   Specimen Description BLOOD LEFT [facility]  Final   Special Requests   Final    BOTTLES DRAWN AEROBIC AND ANAEROBIC Blood Culture adequate volume   Culture   Final    NO  GROWTH 5 DAYS Performed at [facility], [address] [place], [place] [address]    Report Status 12/16/2021 FINAL  Final  Blood Culture (routine x 2)     Status: None   Collection Time: 12/11/21 10:21 AM   Specimen: BLOOD  Result Value Ref Range Status   Specimen Description BLOOD RIGHT ARM  Final   Special Requests   Final    BOTTLES DRAWN AEROBIC AND ANAEROBIC Blood Culture results may not be optimal due to an excessive volume of blood received in culture bottles   Culture   Final    NO GROWTH 5 DAYS Performed at [facility], [address] [place], [place] [address]    Report Status 12/16/2021 FINAL  Final   Urine Culture     Status: Abnormal   Collection Time: 12/11/21  5:36 PM   Specimen: Urine, Random  Result Value Ref Range Status   Specimen Description   Final    URINE, RANDOM Performed at [facility], [address] [place], [place] [address]    Special Requests   Final    NONE Performed at [facility], [address] [place], [place] [address]    Culture >=100,000 COLONIES/mL PROTEUS MIRABILIS (A)  Final   Report Status 12/14/2021 FINAL  Final   Organism ID, Bacteria PROTEUS MIRABILIS (A)  Final      Susceptibility   Proteus mirabilis - MIC*    AMPICILLIN <=2 SENSITIVE Sensitive     CEFAZOLIN 8 SENSITIVE Sensitive     CEFEPIME <=0.12 SENSITIVE Sensitive     CEFTRIAXONE <=0.25 SENSITIVE Sensitive     CIPROFLOXACIN 1 RESISTANT Resistant     GENTAMICIN <=1 SENSITIVE Sensitive     IMIPENEM 1 SENSITIVE Sensitive     NITROFURANTOIN 128 RESISTANT Resistant     TRIMETH/SULFA <=20 SENSITIVE Sensitive     AMPICILLIN/SULBACTAM <=2 SENSITIVE Sensitive     PIP/TAZO <=4 SENSITIVE Sensitive     * >=100,000 COLONIES/mL PROTEUS MIRABILIS    Coagulation Studies: No results for input(s): "LABPROT", "INR" in the last 72 hours.   Urinalysis: No results for input(s): "COLORURINE", "LABSPEC", "PHURINE", "GLUCOSEU", "HGBUR", "BILIRUBINUR", "KETONESUR", "PROTEINUR", "UROBILINOGEN", "NITRITE", "LEUKOCYTESUR" in the last 72 hours.  Invalid input(s): "APPERANCEUR"     Imaging: No results found.   Medications:     ceFAZolin (ANCEF) IV 1 g (12/15/21 1118)    carvedilol  6.25 mg Oral QPM   Chlorhexidine Gluconate Cloth  6 each Topical Q0600   citalopram  10 mg Oral Daily   diphenhydrAMINE       furosemide  40 mg Oral Daily   heparin  5,000 Units Subcutaneous Q8H   ondansetron       pantoprazole  40 mg Oral BID   traZODone  50 mg Oral QHS   acetaminophen, bisacodyl, diphenhydrAMINE, diphenhydrAMINE, guaiFENesin-dextromethorphan, hydrALAZINE, HYDROmorphone  (DILAUDID) injection, ondansetron, ondansetron (ZOFRAN) IV, ondansetron (ZOFRAN) IV, oxyCODONE-acetaminophen, senna-docusate, tiZANidine, topiramate  Assessment/ Plan:  [name] is a 65 y.o.  female   with past medical history of ESRD with hemodialysis TTS, diabetes mellitus, and hypertension. She presents to ED with worsening left lower arm swelling and redness. Patient has been admitted for ESRD (end stage renal disease) on dialysis ([place]) [N18.6, Z99.2] Cellulitis of left upper extremity [L03.114] Left arm cellulitis [X21.194]   [facility]/TTS/right PermCath/83.5 kg  End-stage renal disease on hemodialysis.  Will maintain outpatient schedule if possible.  Dialysis today. Continue patient on outpatient schedule.   2.  Anemia of chronic kidney disease Lab Results  Component Value Date   HGB 10.0 (L) 12/11/2021    Patient receives [facility] outpatient.  Hemoglobin at goal  3. Secondary Hyperparathyroidism:  Lab Results  Component Value Date   PTH 70 (H) 05/12/2020   CALCIUM 8.3 (L) 12/15/2021   PHOS 4.7 (H) 12/15/2021  Calcium and phosphorus within acceptable range.  We will continue to monitor. PTH 497 (12/07/21-outpatient lab)   4.  Hypertension with chronic kidney disease.  Home regimen includes carvedilol and furosemide 40 mg daily.  Currently receiving these inpatient.  Blood pressure 144/67  5.  Left arm cellulitis evidenced by edema, erythema, warm to touch.  Likely secondary to wound infection.  MRI with contrast completed on 12/12/2021 confirming cellulitis, no osteomyelitis noted.  Obtaining WBC scan for further evaluation. Planning for scan on Monday. Infectious disease changed patient to cefazolin.    LOS: [place] 8/5/20238:58 AM

## 2021-12-16 NOTE — Progress Notes (Signed)
Progress Note    Katie [name]  VEH:[id number] DOB: 06-25-1955  DOA: 12/11/2021 PCP: Merryl Hacker, No      Brief Narrative:    Medical records reviewed and are as summarized below:  Katie [name] is a 66 y.o. female with medical history significant for ESRD on hemodialysis, type II DM, hypertension, homelessness, sepsis from pneumonia in June 2022, left upper extremity cellulitis in June 2023, dog bite infection left upper extremity June 2023, chronic disease, aortic stenosis, fecal impaction, recent discharge from the hospital on 11/26/2021.  She has an old left arm fistula packed she has been getting hemodialysis through a right upper chest wall permacath.   She was found to have left upper extremity cellulitis.     Assessment/Plan:   Principal Problem:   Cellulitis of left upper extremity Active Problems:   ESRD (end stage renal disease) ([place])   Hypertension   Anemia in ESRD (end-stage renal disease) ([facility])   Elevated lactic acid level   Chronic diastolic CHF (congestive heart failure) (H[facility])   Depression, unspecified   Type II diabetes mellitus with renal manifestations ([facility])    Body mass index is 25.69 kg/m.  Recurrent left upper extremity cellulitis: Painful swelling of left forearm with erythema is not improving as expected.  Vascular surgery recommended tagged WBC scan to evaluate left arm AV graft for infection.  This will probably not be done until Monday because of the weekend holiday.  Continue IV cefazolin. She was recently treated with 10-day course of doxycycline and Keflex (completed on 11/18/2021) for left upper extremity cellulitis.  No evidence of DVT or abscess in left upper extremity.   ESRD, anemia of chronic kidney disease, secondary hyperparathyroidism: Follow-up with nephrologist for hemodialysis.  Chronic diastolic CHF: Compensated  Lactic acidosis: Resolved  Other comorbidities include depression, hypertension,  Diet Order             Diet renal  with fluid restriction Fluid restriction: 1200 mL Fluid; Room service appropriate? Yes; Fluid consistency: Thin  Diet effective now                            Consultants: Nephrologist, ID specialist, vascular surgeon  Procedures: None    Medications:    carvedilol  6.25 mg Oral QPM   Chlorhexidine Gluconate Cloth  6 each Topical Q0600   citalopram  10 mg Oral Daily   furosemide  40 mg Oral Daily   heparin  5,000 Units Subcutaneous Q8H   heparin sodium (porcine)       pantoprazole  40 mg Oral BID   traZODone  50 mg Oral QHS   Continuous Infusions:   ceFAZolin (ANCEF) IV 1 g (12/15/21 1118)     Anti-infectives (From admission, onward)    Start     Dose/Rate Route Frequency Ordered Stop   12/16/21 1200  vancomycin (VANCOREADY) IVPB 750 mg/150 mL  Status:  Discontinued        750 mg 150 mL/hr over 60 Minutes Intravenous Every T-Th-Sa (Hemodialysis) 12/13/21 1235 12/13/21 2135   12/14/21 1000  ceFAZolin (ANCEF) IVPB 1 g/50 mL premix        1 g 100 mL/hr over 30 Minutes Intravenous Daily 12/13/21 2131     12/13/21 1300  vancomycin (VANCOREADY) IVPB 750 mg/150 mL        750 mg 150 mL/hr over 60 Minutes Intravenous  Once 12/13/21 1235 12/14/21 0736   12/13/21 1000  cefTRIAXone (ROCEPHIN) 2  g in sodium chloride 0.9 % 100 mL IVPB  Status:  Discontinued        2 g 200 mL/hr over 30 Minutes Intravenous Every 24 hours 12/12/21 1502 12/13/21 2130   12/12/21 1200  cefTRIAXone (ROCEPHIN) 1 g in sodium chloride 0.9 % 100 mL IVPB  Status:  Discontinued        1 g 200 mL/hr over 30 Minutes Intravenous Every 24 hours 12/11/21 1424 12/12/21 1502   12/12/21 1200  vancomycin (VANCOREADY) IVPB 750 mg/150 mL  Status:  Discontinued        750 mg 150 mL/hr over 60 Minutes Intravenous Every T-Th-Sa (Hemodialysis) 12/11/21 1428 12/13/21 1235   12/11/21 2200  metroNIDAZOLE (FLAGYL) IVPB 500 mg  Status:  Discontinued        500 mg 100 mL/hr over 60 Minutes Intravenous Every 12  hours 12/11/21 1424 12/13/21 2130   12/11/21 1430  cefTRIAXone (ROCEPHIN) 1 g in sodium chloride 0.9 % 100 mL IVPB  Status:  Discontinued        1 g 200 mL/hr over 30 Minutes Intravenous Every 24 hours 12/11/21 1423 12/11/21 1424   12/11/21 1130  ceFEPIme (MAXIPIME) 2 g in sodium chloride 0.9 % 100 mL IVPB        2 g 200 mL/hr over 30 Minutes Intravenous  Once 12/11/21 1118 12/11/21 1529   12/11/21 1130  vancomycin (VANCOREADY) IVPB 1500 mg/300 mL        1,500 mg 150 mL/hr over 120 Minutes Intravenous  Once 12/11/21 1118 12/11/21 1845   12/11/21 1045  metroNIDAZOLE (FLAGYL) IVPB 500 mg        500 mg 100 mL/hr over 60 Minutes Intravenous  Once 12/11/21 1032 12/11/21 1200              Family Communication/Anticipated D/C date and plan/Code Status   DVT prophylaxis: heparin injection 5,000 Units Start: 12/11/21 1400     Code Status: Full Code  Family Communication: None Disposition Plan: Plan to discharge home in 2 to 3 days   Status is: Inpatient Remains inpatient appropriate because: IV antibiotics for left upper extremity cellulitis      Subjective:   She complains of persistent pain and swelling in the left forearm  Objective:    Vitals:   12/16/21 1100 12/16/21 1129 12/16/21 1131 12/16/21 1210  BP: (!) 129/103 (!) 147/77 131/81 (!) 165/90  Pulse: 80 77 75 86  Resp: 20 (!) 21 18 16   Temp:  98.6 F (37 C)  99.1 F (37.3 C)  TempSrc:  Oral    SpO2: 100% 96%  96%  Weight:   72.2 kg   Height:       No data found.   Intake/Output Summary (Last 24 hours) at 12/16/2021 1441 Last data filed at 12/16/2021 1129 Gross per 24 hour  Intake --  Output 1000 ml  Net -1000 ml   Filed Weights   12/16/21 0500 12/16/21 0757 12/16/21 1131  Weight: 77.1 kg 73 kg 72.2 kg    Exam:  GEN: NAD SKIN: Warm and dry EYES: No pallor or icterus ENT: MMM CV: RRR PULM: CTA B ABD: soft, ND, NT, +BS CNS: AAO x 3, non focal EXT: Left forearm swelling, tenderness and  erythema is not improving.  Left anterior arm tenderness       Data Reviewed:   I have personally reviewed following labs and imaging studies:  Labs: Labs show the following:   Basic Metabolic Panel: Recent Labs  Lab  12/11/21 1021 12/12/21 0439 12/15/21 0931  NA 139 137 138  K 4.5 4.5 3.9  CL 103 103 102  CO2 26 24 27   GLUCOSE 157* 93 104*  BUN 38* 44* 36*  CREATININE 6.81* 6.79* 4.61*  CALCIUM 8.5* 8.3* 8.3*  PHOS  --  5.6* 4.7*   GFR Estimated Creatinine Clearance: 12.7 mL/min (A) (by C-G formula based on SCr of 4.61 mg/dL (H)). Liver Function Tests: Recent Labs  Lab 12/11/21 1021 12/15/21 0931  AST 20  --   ALT 17  --   ALKPHOS 83  --   BILITOT 0.7  --   PROT 5.9*  --   ALBUMIN 2.8* 2.7*   No results for input(s): "LIPASE", "AMYLASE" in the last 168 hours. No results for input(s): "AMMONIA" in the last 168 hours. Coagulation profile Recent Labs  Lab 12/11/21 1021  INR 1.1    CBC: Recent Labs  Lab 12/11/21 1021  WBC 6.4  NEUTROABS 3.8  HGB 10.0*  HCT 32.8*  MCV 97.9  PLT 219   Cardiac Enzymes: No results for input(s): "CKTOTAL", "CKMB", "CKMBINDEX", "TROPONINI" in the last 168 hours. BNP (last 3 results) No results for input(s): "PROBNP" in the last 8760 hours. CBG: Recent Labs  Lab 12/13/21 0905  GLUCAP 104*   D-Dimer: No results for input(s): "DDIMER" in the last 72 hours. Hgb A1c: No results for input(s): "HGBA1C" in the last 72 hours. Lipid Profile: No results for input(s): "CHOL", "HDL", "LDLCALC", "TRIG", "CHOLHDL", "LDLDIRECT" in the last 72 hours. Thyroid function studies: No results for input(s): "TSH", "T4TOTAL", "T3FREE", "THYROIDAB" in the last 72 hours.  Invalid input(s): "FREET3" Anemia work up: No results for input(s): "VITAMINB12", "FOLATE", "FERRITIN", "TIBC", "IRON", "RETICCTPCT" in the last 72 hours. Sepsis Labs: Recent Labs  Lab 12/11/21 1021 12/11/21 1943  PROCALCITON 0.14  --   WBC 6.4  --    LATICACIDVEN 2.2* 0.9    Microbiology Recent Results (from the past 240 hour(s))  Blood Culture (routine x 2)     Status: None   Collection Time: 12/11/21 10:21 AM   Specimen: BLOOD  Result Value Ref Range Status   Specimen Description BLOOD LEFT [facility]  Final   Special Requests   Final    BOTTLES DRAWN AEROBIC AND ANAEROBIC Blood Culture adequate volume   Culture   Final    NO GROWTH 5 DAYS Performed at [facility], [address] [place], [place] [address]    Report Status 12/16/2021 FINAL  Final  Blood Culture (routine x 2)     Status: None   Collection Time: 12/11/21 10:21 AM   Specimen: BLOOD  Result Value Ref Range Status   Specimen Description BLOOD RIGHT ARM  Final   Special Requests   Final    BOTTLES DRAWN AEROBIC AND ANAEROBIC Blood Culture results may not be optimal due to an excessive volume of blood received in culture bottles   Culture   Final    NO GROWTH 5 DAYS Performed at [facility], [address] [place], [place] [address]    Report Status 12/16/2021 FINAL  Final  Urine Culture     Status: Abnormal   Collection Time: 12/11/21  5:36 PM   Specimen: Urine, Random  Result Value Ref Range Status   Specimen Description   Final    URINE, RANDOM Performed at [facility], [address], [place], [place] [address]    Special Requests   Final    NONE Performed at  [place], [place] [address]    Culture >=100,000 COLONIES/mL PROTEUS MIRABILIS (A)  Final   Report Status 12/14/2021 FINAL  Final   Organism ID, Bacteria PROTEUS MIRABILIS (A)  Final      Susceptibility   Proteus mirabilis - MIC*    AMPICILLIN <=2 SENSITIVE Sensitive     CEFAZOLIN 8 SENSITIVE Sensitive     CEFEPIME <=0.12 SENSITIVE Sensitive     CEFTRIAXONE <=0.25 SENSITIVE Sensitive     CIPROFLOXACIN 1 RESISTANT Resistant     GENTAMICIN <=1 SENSITIVE Sensitive     IMIPENEM 1 SENSITIVE Sensitive     NITROFURANTOIN 128  RESISTANT Resistant     TRIMETH/SULFA <=20 SENSITIVE Sensitive     AMPICILLIN/SULBACTAM <=2 SENSITIVE Sensitive     PIP/TAZO <=4 SENSITIVE Sensitive     * >=100,000 COLONIES/mL PROTEUS MIRABILIS    Procedures and diagnostic studies:  No results found.             LOS: 5 days   Armie Moren  [facility]   Pager on [email or web address]. If 7PM-7AM, please contact night-coverage at [email or web address]     12/16/2021, 2:41 PM

## 2021-12-16 NOTE — Progress Notes (Signed)
Received patient in bed, alert and oriented. Consent verified. Treatment tolerated without incident. Patient requested and received benadryl and Zofran at the initiation of treatment with relief from nausea and itching. Patient has left arm swelling secondary by her account to infiltration of AVF, unchanged from the start of tx.  Vital signs are stable, afebrile, O2@2LPM  via [place] for comfort. Report given to primary nurse. Patient to transfer to assigned room.

## 2021-12-17 DIAGNOSIS — Z992 Dependence on renal dialysis: Secondary | ICD-10-CM | POA: Diagnosis not present

## 2021-12-17 DIAGNOSIS — L03114 Cellulitis of left upper limb: Secondary | ICD-10-CM | POA: Diagnosis not present

## 2021-12-17 DIAGNOSIS — N186 End stage renal disease: Secondary | ICD-10-CM | POA: Diagnosis not present

## 2021-12-17 NOTE — Progress Notes (Signed)
[facility]  ROUNDING NOTE   Subjective:   Katie [name] is a 66 year old female with past medical history of ESRD with hemodialysis TTS, diabetes mellitus, and hypertension. She presents to ED with worsening left lower arm swelling and redness. Patient has been admitted for ESRD (end stage renal disease) on dialysis ([place]) [N18.6, Z99.2] Cellulitis of left upper extremity [L03.114] Left arm cellulitis [B84.665]  Patient is known to our practice and receives outpatient dialysis treatments at [facility] on a TTS schedule, supervised by Dr. Holley Raring.  Patient missed treatment on Saturday 12/09/2021 due to transportation issues.    Patient very excited to be eating breakfast this morning.  Alert and oriented Tolerating meals without nausea and vomiting Remains on room air Left arm remains edematous and sore. Reports it continues to be painful but that she is satisfied with her pain medication. Denies any difficulties with HD yesterday     Objective:  Vital signs in last 24 hours:  Temp:  [98.4 F (36.9 C)-99.1 F (37.3 C)] 98.6 F (37 C) (08/06 0747) Pulse Rate:  [66-86] 75 (08/06 0747) Resp:  [10-21] 16 (08/06 0747) BP: (125-165)/(60-103) 165/77 (08/06 0747) SpO2:  [96 %-100 %] 97 % (08/06 0747) Weight:  [72.2 kg-76.3 kg] 76.3 kg (08/06 0500)  Weight change: -4.1 kg Filed Weights   12/16/21 0757 12/16/21 1131 12/17/21 0500  Weight: 73 kg 72.2 kg 76.3 kg    Intake/Output: I/O last 3 completed shifts: In: -  Out: 1000 [Other:1000]   Intake/Output this shift:  No intake/output data recorded.  Physical Exam: General: NAD  Head: Normocephalic, atraumatic. Moist oral mucosal membranes  Eyes: Anicteric  Lungs:  Clear to auscultation, normal effort, room air  Heart: Regular rate and rhythm  Abdomen:  Soft, nontender  Extremities: No lower extremity peripheral edema.  Left upper extremity: 3+ edema, warm and erythematous.   Neurologic: Nonfocal, moving all  four extremities  Skin: No lesions, left upper extremity erythema  Access: Right chest PermCath    Basic Metabolic Panel: Recent Labs  Lab 12/11/21 1021 12/12/21 0439 12/15/21 0931  NA 139 137 138  K 4.5 4.5 3.9  CL 103 103 102  CO2 26 24 27   GLUCOSE 157* 93 104*  BUN 38* 44* 36*  CREATININE 6.81* 6.79* 4.61*  CALCIUM 8.5* 8.3* 8.3*  PHOS  --  5.6* 4.7*     Liver Function Tests: Recent Labs  Lab 12/11/21 1021 12/15/21 0931  AST 20  --   ALT 17  --   ALKPHOS 83  --   BILITOT 0.7  --   PROT 5.9*  --   ALBUMIN 2.8* 2.7*    No results for input(s): "LIPASE", "AMYLASE" in the last 168 hours. No results for input(s): "AMMONIA" in the last 168 hours.  CBC: Recent Labs  Lab 12/11/21 1021  WBC 6.4  NEUTROABS 3.8  HGB 10.0*  HCT 32.8*  MCV 97.9  PLT 219     Cardiac Enzymes: No results for input(s): "CKTOTAL", "CKMB", "CKMBINDEX", "TROPONINI" in the last 168 hours.  BNP: Invalid input(s): "POCBNP"  CBG: Recent Labs  Lab 12/13/21 0905  GLUCAP 104*     Microbiology: Results for orders placed or performed during the hospital encounter of 12/11/21  Blood Culture (routine x 2)     Status: None   Collection Time: 12/11/21 10:21 AM   Specimen: BLOOD  Result Value Ref Range Status   Specimen Description BLOOD LEFT [facility]  Final   Special Requests   Final  BOTTLES DRAWN AEROBIC AND ANAEROBIC Blood Culture adequate volume   Culture   Final    NO GROWTH 5 DAYS Performed at [facility], [address] [place], [place] [address]    Report Status 12/16/2021 FINAL  Final  Blood Culture (routine x 2)     Status: None   Collection Time: 12/11/21 10:21 AM   Specimen: BLOOD  Result Value Ref Range Status   Specimen Description BLOOD RIGHT ARM  Final   Special Requests   Final    BOTTLES DRAWN AEROBIC AND ANAEROBIC Blood Culture results may not be optimal due to an excessive volume of blood received in culture bottles   Culture   Final    NO GROWTH 5  DAYS Performed at [facility], [address] [place], [place] [address]    Report Status 12/16/2021 FINAL  Final  Urine Culture     Status: Abnormal   Collection Time: 12/11/21  5:36 PM   Specimen: Urine, Random  Result Value Ref Range Status   Specimen Description   Final    URINE, RANDOM Performed at [facility], [address], [place], [place] [address]    Special Requests   Final    NONE Performed at [facility], [address], [place], [place] [address]    Culture >=100,000 COLONIES/mL PROTEUS MIRABILIS (A)  Final   Report Status 12/14/2021 FINAL  Final   Organism ID, Bacteria PROTEUS MIRABILIS (A)  Final      Susceptibility   Proteus mirabilis - MIC*    AMPICILLIN <=2 SENSITIVE Sensitive     CEFAZOLIN 8 SENSITIVE Sensitive     CEFEPIME <=0.12 SENSITIVE Sensitive     CEFTRIAXONE <=0.25 SENSITIVE Sensitive     CIPROFLOXACIN 1 RESISTANT Resistant     GENTAMICIN <=1 SENSITIVE Sensitive     IMIPENEM 1 SENSITIVE Sensitive     NITROFURANTOIN 128 RESISTANT Resistant     TRIMETH/SULFA <=20 SENSITIVE Sensitive     AMPICILLIN/SULBACTAM <=2 SENSITIVE Sensitive     PIP/TAZO <=4 SENSITIVE Sensitive     * >=100,000 COLONIES/mL PROTEUS MIRABILIS    Coagulation Studies: No results for input(s): "LABPROT", "INR" in the last 72 hours.   Urinalysis: No results for input(s): "COLORURINE", "LABSPEC", "PHURINE", "GLUCOSEU", "HGBUR", "BILIRUBINUR", "KETONESUR", "PROTEINUR", "UROBILINOGEN", "NITRITE", "LEUKOCYTESUR" in the last 72 hours.  Invalid input(s): "APPERANCEUR"     Imaging: No results found.   Medications:     ceFAZolin (ANCEF) IV 1 g (12/16/21 1847)    carvedilol  6.25 mg Oral QPM   Chlorhexidine Gluconate Cloth  6 each Topical Q0600   citalopram  10 mg Oral Daily   furosemide  40 mg Oral Daily   heparin  5,000 Units Subcutaneous Q8H   pantoprazole  40 mg Oral BID   traZODone  50 mg Oral QHS   acetaminophen, bisacodyl,  diphenhydrAMINE, guaiFENesin-dextromethorphan, hydrALAZINE, HYDROmorphone (DILAUDID) injection, ondansetron (ZOFRAN) IV, ondansetron (ZOFRAN) IV, oxyCODONE-acetaminophen, senna-docusate, tiZANidine, topiramate  Assessment/ Plan:  Ms. Harla Mensch is a 66 y.o.  female   with past medical history of ESRD with hemodialysis TTS, diabetes mellitus, and hypertension. She presents to ED with worsening left lower arm swelling and redness. Patient has been admitted for ESRD (end stage renal disease) on dialysis ([place]) [N18.6, Z99.2] Cellulitis of left upper extremity [L03.114] Left arm cellulitis [M38.466]   [facility]/TTS/right PermCath/83.5 kg  End-stage renal disease on hemodialysis.  Will maintain outpatient schedule if possible. No indication for HD today.   2.  Anemia of chronic kidney disease Lab Results  Component Value Date   HGB 10.0 (L) 12/11/2021    Patient receives [place] outpatient.  Hemoglobin at goal  3. Secondary Hyperparathyroidism:  Lab Results  Component Value Date   PTH 70 (H) 05/12/2020   CALCIUM 8.3 (L) 12/15/2021   PHOS 4.7 (H) 12/15/2021  Calcium and phosphorus within acceptable range.  We will continue to monitor. PTH 497 (12/07/21-outpatient lab)   4.  Hypertension with chronic kidney disease.  Home regimen includes carvedilol and furosemide 40 mg daily.  Currently receiving these inpatient.  Blood pressure 165/77  5.  Left arm cellulitis evidenced by edema, erythema, warm to touch.  Likely secondary to wound infection.  MRI with contrast completed on 12/12/2021 confirming cellulitis, no osteomyelitis noted.  Obtaining WBC scan for further evaluation. Planning for scan on Monday. Infectious disease changed patient to cefazolin. Was previously receiving vancomycin, ceftriazone and flagyl.    LOS: [place] 8/6/202310:24 AM

## 2021-12-17 NOTE — H&P (View-Only) (Signed)
Subjective: Interval History: has complaints of continued left arm swelling and pain with motion...   Objective: Vital signs in last 24 hours: Temp:  [98.4 F (36.9 C)-99.1 F (37.3 C)] 98.6 F (37 C) (08/06 0747) Pulse Rate:  [66-86] 75 (08/06 0747) Resp:  [10-21] 16 (08/06 0747) BP: (114-165)/(60-103) 165/77 (08/06 0747) SpO2:  [96 %-100 %] 97 % (08/06 0747) Weight:  [72.2 kg-76.3 kg] 76.3 kg (08/06 0500)  Intake/Output from previous day: 08/05 0701 - 08/06 0700 In: -  Out: 1000  Intake/Output this shift: No intake/output data recorded.  Extremities: Left upper extremity with significant edema beginning just above the antecubital crease extending distally involving the forearm circumferentially.  There is peau d'orange and moderate erythema and warmth.  Pulse examination is normal.  Dialysis access is a palpable thrill.  There is no obvious erythema or induration directly overlying the access graft.  Lab Results: No results for input(s): "WBC", "HGB", "HCT", "PLT" in the last 72 hours. BMET Recent Labs    12/15/21 0931  NA 138  K 3.9  CL 102  CO2 27  GLUCOSE 104*  BUN 36*  CREATININE 4.61*  CALCIUM 8.3*    Studies/Results: MR FOREARM LEFT W WO CONTRAST  Result Date: 12/13/2021 CLINICAL DATA:  Soft tissue mass, forearm, deep; Soft tissue mass, upper arm, deep EXAM: MRI OF THE LEFT FOREARM WITHOUT AND WITH CONTRAST; MRI OF THE LEFT HUMERUS WITHOUT AND WITH CONTRAST TECHNIQUE: Multiplanar, multisequence MR imaging of the left forearm and left humerus was performed before and after the administration of intravenous contrast. CONTRAST:  68mL GADAVIST GADOBUTROL 1 MMOL/ML IV SOLN COMPARISON:  None Available. FINDINGS: Bones/Joint/Cartilage There is no significant marrow signal alteration. The cortex is intact. No aggressive bone lesion. There is a trace elbow joint effusion. Muscles and Tendons There is generalized loss of muscle bulk. There is intramuscular edema within the  brachialis, brachioradialis, and involving the flexor compartment musculature of the forearm with mild interfascial edema. There is no intramuscular collection. Soft tissues There is extensive skin thickening and subcutaneous soft tissue swelling of the left upper extremity, extending from the upper arm through the forearm, and worst along the forearm. There is no well-defined/drainable fluid collection. IMPRESSION: Findings are most consistent with left upper extremity cellulitis with myofasciitis in the distal upper arm and flexor compartment of the forearm. No evidence of osteomyelitis or soft tissue abscess. Trace elbow joint effusion without other convincing findings to suggest septic arthritis. Electronically Signed   By: Maurine Simmering M.D.   On: 12/13/2021 08:28   MR HUMERUS LEFT W WO CONTRAST  Result Date: 12/13/2021 CLINICAL DATA:  Soft tissue mass, forearm, deep; Soft tissue mass, upper arm, deep EXAM: MRI OF THE LEFT FOREARM WITHOUT AND WITH CONTRAST; MRI OF THE LEFT HUMERUS WITHOUT AND WITH CONTRAST TECHNIQUE: Multiplanar, multisequence MR imaging of the left forearm and left humerus was performed before and after the administration of intravenous contrast. CONTRAST:  21mL GADAVIST GADOBUTROL 1 MMOL/ML IV SOLN COMPARISON:  None Available. FINDINGS: Bones/Joint/Cartilage There is no significant marrow signal alteration. The cortex is intact. No aggressive bone lesion. There is a trace elbow joint effusion. Muscles and Tendons There is generalized loss of muscle bulk. There is intramuscular edema within the brachialis, brachioradialis, and involving the flexor compartment musculature of the forearm with mild interfascial edema. There is no intramuscular collection. Soft tissues There is extensive skin thickening and subcutaneous soft tissue swelling of the left upper extremity, extending from the upper arm through the  forearm, and worst along the forearm. There is no well-defined/drainable fluid  collection. IMPRESSION: Findings are most consistent with left upper extremity cellulitis with myofasciitis in the distal upper arm and flexor compartment of the forearm. No evidence of osteomyelitis or soft tissue abscess. Trace elbow joint effusion without other convincing findings to suggest septic arthritis. Electronically Signed   By: Maurine Simmering M.D.   On: 12/13/2021 08:28   US Venous Img Upper Uni Left  Result Date: 12/11/2021 CLINICAL DATA:  Pain and swelling in LEFT arm, dog by 2 weeks ago, has fistula in same arm EXAM: LEFT UPPER EXTREMITY VENOUS DOPPLER ULTRASOUND TECHNIQUE: Gray-scale sonography with graded compression, as well as color Doppler and duplex ultrasound were performed to evaluate the upper extremity deep venous system from the level of the subclavian vein and including the jugular, axillary, basilic, radial, ulnar and upper cephalic vein. Spectral Doppler was utilized to evaluate flow at rest and with distal augmentation maneuvers. COMPARISON:  11/19/2021 FINDINGS: Contralateral Subclavian Vein: Respiratory phasicity is normal and symmetric with the symptomatic side. No evidence of thrombus. Normal compressibility. Internal Jugular Vein: No evidence of thrombus. Normal compressibility, respiratory phasicity and response to augmentation. Subclavian Vein: No evidence of thrombus. Normal compressibility, respiratory phasicity and response to augmentation. Axillary Vein: No evidence of thrombus. Normal compressibility, respiratory phasicity and response to augmentation. Cephalic Vein: No evidence of thrombus. Normal compressibility, respiratory phasicity and response to augmentation. Basilic Vein: No evidence of thrombus. Normal compressibility, respiratory phasicity and response to augmentation. Brachial Veins: No evidence of thrombus. Normal compressibility, respiratory phasicity and response to augmentation. Radial Veins: No evidence of thrombus. Normal compressibility, respiratory  phasicity and response to augmentation. Ulnar Veins: No evidence of thrombus. Normal compressibility, respiratory phasicity and response to augmentation. Venous Reflux:  None visualized. Other Findings: Dialysis AV fistula present, patent. Scattered soft tissue swelling LEFT forearm. IMPRESSION: No evidence of DVT within the LEFT upper extremity. Patent dialysis fistula. Electronically Signed   By: Lavonia Dana M.D.   On: 12/11/2021 11:29   DG Chest Port 1 View  Result Date: 12/11/2021 CLINICAL DATA:  Questionable sepsis. EXAM: PORTABLE CHEST 1 VIEW COMPARISON:  November 23, 2021 FINDINGS: Patient is rotated. Right chest dual lumen catheter with tips projecting over the superior cavoatrial junction/right atrium. Enlarged cardiac silhouette. Aortic atherosclerosis. Linear band of scarring versus atelectasis in the left lung base. Bibasilar atelectasis. No new focal airspace consolidation. No visible pleural effusion or pneumothorax. No acute osseous abnormality. IMPRESSION: Similar bibasilar atelectasis. No new focal airspace consolidation. Electronically Signed   By: Dahlia Bailiff M.D.   On: 12/11/2021 10:58   DG Chest Port 1 View  Addendum Date: 11/23/2021   ADDENDUM REPORT: 11/23/2021 12:17 ADDENDUM: Requested No radiographic evidence of TB identified. Electronically Signed   By: Lavonia Dana M.D.   On: 11/23/2021 12:17   Result Date: 11/23/2021 CLINICAL DATA:  Screening, chronic kidney disease, type II diabetes mellitus, hypertension EXAM: PORTABLE CHEST 1 VIEW COMPARISON:  Portable exam 1107 hours compared to 10/23/2021 FINDINGS: RIGHT jugular Port-A-Cath with tip projecting over cavoatrial junction. Enlargement of cardiac silhouette. Mediastinal contours and pulmonary vascularity normal. Atherosclerotic calcification aorta. Minimal bibasilar atelectasis. Lungs otherwise clear. No acute infiltrate, pleural effusion, or pneumothorax. No acute osseous findings. IMPRESSION: Minimal bibasilar atelectasis.  Aortic Atherosclerosis (ICD10-I70.0). Electronically Signed: By: Lavonia Dana M.D. On: 11/23/2021 11:18   [place] LT UPPER EXTREM LTD SOFT TISSUE NON VASCULAR  Result Date: 11/19/2021 CLINICAL DATA:  Left arm pain, redness, and swelling. EXAM:  ULTRASOUND LEFT UPPER EXTREMITY LIMITED TECHNIQUE: Ultrasound examination of the upper extremity soft tissues was performed in the area of clinical concern. COMPARISON:  None Available. FINDINGS: Focal ultrasound of the areas of concern in the left upper arm and forearm demonstrate no discrete soft tissue mass or fluid collection. IMPRESSION: 1. Negative. No sonographic abnormality at the sites of the palpable abnormalities confirmed by the patient. Electronically Signed   By: Titus Dubin M.D.   On: 11/19/2021 13:35   Anti-infectives: Anti-infectives (From admission, onward)    Start     Dose/Rate Route Frequency Ordered Stop   12/16/21 1200  vancomycin (VANCOREADY) IVPB 750 mg/150 mL  Status:  Discontinued        750 mg 150 mL/hr over 60 Minutes Intravenous Every T-Th-Sa (Hemodialysis) 12/13/21 1235 12/13/21 2135   12/14/21 1000  ceFAZolin (ANCEF) IVPB 1 g/50 mL premix        1 g 100 mL/hr over 30 Minutes Intravenous Daily 12/13/21 2131     12/13/21 1300  vancomycin (VANCOREADY) IVPB 750 mg/150 mL        750 mg 150 mL/hr over 60 Minutes Intravenous  Once 12/13/21 1235 12/14/21 0736   12/13/21 1000  cefTRIAXone (ROCEPHIN) 2 g in sodium chloride 0.9 % 100 mL IVPB  Status:  Discontinued        2 g 200 mL/hr over 30 Minutes Intravenous Every 24 hours 12/12/21 1502 12/13/21 2130   12/12/21 1200  cefTRIAXone (ROCEPHIN) 1 g in sodium chloride 0.9 % 100 mL IVPB  Status:  Discontinued        1 g 200 mL/hr over 30 Minutes Intravenous Every 24 hours 12/11/21 1424 12/12/21 1502   12/12/21 1200  vancomycin (VANCOREADY) IVPB 750 mg/150 mL  Status:  Discontinued        750 mg 150 mL/hr over 60 Minutes Intravenous Every T-Th-Sa (Hemodialysis) 12/11/21 1428 12/13/21  1235   12/11/21 2200  metroNIDAZOLE (FLAGYL) IVPB 500 mg  Status:  Discontinued        500 mg 100 mL/hr over 60 Minutes Intravenous Every 12 hours 12/11/21 1424 12/13/21 2130   12/11/21 1430  cefTRIAXone (ROCEPHIN) 1 g in sodium chloride 0.9 % 100 mL IVPB  Status:  Discontinued        1 g 200 mL/hr over 30 Minutes Intravenous Every 24 hours 12/11/21 1423 12/11/21 1424   12/11/21 1130  ceFEPIme (MAXIPIME) 2 g in sodium chloride 0.9 % 100 mL IVPB        2 g 200 mL/hr over 30 Minutes Intravenous  Once 12/11/21 1118 12/11/21 1529   12/11/21 1130  vancomycin (VANCOREADY) IVPB 1500 mg/300 mL        1,500 mg 150 mL/hr over 120 Minutes Intravenous  Once 12/11/21 1118 12/11/21 1845   12/11/21 1045  metroNIDAZOLE (FLAGYL) IVPB 500 mg        500 mg 100 mL/hr over 60 Minutes Intravenous  Once 12/11/21 1032 12/11/21 1200       Assessment/Plan: Severe cellulitis of the left upper extremity, primarily forearm.  Question of infection of her prosthetic dialysis access remains to be answered but her physical findings are distal to the dialysis access rather than involving the access itself. Continue aggressive empiric antibiotic therapy.  Consider formal fistulography to assess for any venous outflow obstruction that may be worsening her edema and delaying her clinical response.  At present, it does not appear that the dialysis access is infected but this certainly remains to be ruled out.  LOS: 6 days   Bertram Savin 12/17/2021, 9:02 AM

## 2021-12-17 NOTE — Plan of Care (Signed)
  Problem: Health Behavior/Discharge Planning: Goal: Ability to manage health-related needs will improve Outcome: Progressing   Problem: Activity: Goal: Risk for activity intolerance will decrease Outcome: Progressing   Problem: Nutrition: Goal: Adequate nutrition will be maintained Outcome: Progressing   Problem: Coping: Goal: Level of anxiety will decrease Outcome: Progressing   Problem: Pain Managment: Goal: General experience of comfort will improve Outcome: Progressing   Problem: Safety: Goal: Ability to remain free from injury will improve Outcome: Progressing   

## 2021-12-17 NOTE — Progress Notes (Signed)
Progress Note    Katie [name]  JXB:[id number] DOB: [birth year]  DOA: 12/11/2021 PCP: Merryl Hacker, No      Brief Narrative:    Medical records reviewed and are as summarized below:  Katie [name] is a 66 y.o. female with medical history significant for ESRD on hemodialysis, type II DM, hypertension, homelessness, sepsis from pneumonia in June 2022, left upper extremity cellulitis in June 2023, dog bite infection left upper extremity June 2023, chronic disease, aortic stenosis, fecal impaction, recent discharge from the hospital on 11/26/2021.  She has an old left arm fistula packed she has been getting hemodialysis through a right upper chest wall permacath.   She was found to have left upper extremity cellulitis.     Assessment/Plan:   Principal Problem:   Cellulitis of left upper extremity Active Problems:   ESRD (end stage renal disease) on dialysis ([place])   Hypertension   Anemia in ESRD (end-stage renal disease) (H[facility])   Elevated lactic acid level   Chronic diastolic CHF (congestive heart failure) (HCC)   Depression, unspecified   Type II diabetes mellitus with renal manifestations ([facility])    Body mass index is 27.15 kg/m.  Recurrent left upper extremity cellulitis: Painful swelling of left forearm with erythema is not improving as expected.  Vascular surgery recommended tagged WBC scan to evaluate left arm AV graft for infection.  Hopefully this will be done tomorrow.  Continue IV cefazolin for now.  She was recently treated with 10-day course of doxycycline and Keflex (completed on 11/18/2021) for left upper extremity cellulitis.  No evidence of DVT or abscess in left upper extremity.   ESRD, anemia of chronic kidney disease, secondary hyperparathyroidism: Follow-up with nephrologist for hemodialysis  Chronic diastolic CHF: Compensated  Lactic acidosis: Resolved  Other comorbidities include depression, hypertension,  Diet Order             Diet renal with fluid  restriction Fluid restriction: 1200 mL Fluid; Room service appropriate? Yes; Fluid consistency: Thin  Diet effective now                            Consultants: Nephrologist, ID specialist, vascular surgeon  Procedures: None    Medications:    carvedilol  6.25 mg Oral QPM   Chlorhexidine Gluconate Cloth  6 each Topical Q0600   citalopram  10 mg Oral Daily   furosemide  40 mg Oral Daily   heparin  5,000 Units Subcutaneous Q8H   pantoprazole  40 mg Oral BID   traZODone  50 mg Oral QHS   Continuous Infusions:   ceFAZolin (ANCEF) IV 1 g (12/16/21 1847)     Anti-infectives (From admission, onward)    Start     Dose/Rate Route Frequency Ordered Stop   12/16/21 1200  vancomycin (VANCOREADY) IVPB 750 mg/150 mL  Status:  Discontinued        750 mg 150 mL/hr over 60 Minutes Intravenous Every T-Th-Sa (Hemodialysis) 12/13/21 1235 12/13/21 2135   12/14/21 1000  ceFAZolin (ANCEF) IVPB 1 g/50 mL premix        1 g 100 mL/hr over 30 Minutes Intravenous Daily 12/13/21 2131     12/13/21 1300  vancomycin (VANCOREADY) IVPB 750 mg/150 mL        750 mg 150 mL/hr over 60 Minutes Intravenous  Once 12/13/21 1235 12/14/21 0736   12/13/21 1000  cefTRIAXone (ROCEPHIN) 2 g in sodium chloride 0.9 % 100 mL IVPB  Status:  Discontinued        2 g 200 mL/hr over 30 Minutes Intravenous Every 24 hours 12/12/21 1502 12/13/21 2130   12/12/21 1200  cefTRIAXone (ROCEPHIN) 1 g in sodium chloride 0.9 % 100 mL IVPB  Status:  Discontinued        1 g 200 mL/hr over 30 Minutes Intravenous Every 24 hours 12/11/21 1424 12/12/21 1502   12/12/21 1200  vancomycin (VANCOREADY) IVPB 750 mg/150 mL  Status:  Discontinued        750 mg 150 mL/hr over 60 Minutes Intravenous Every T-Th-Sa (Hemodialysis) 12/11/21 1428 12/13/21 1235   12/11/21 2200  metroNIDAZOLE (FLAGYL) IVPB 500 mg  Status:  Discontinued        500 mg 100 mL/hr over 60 Minutes Intravenous Every 12 hours 12/11/21 1424 12/13/21 2130   12/11/21  1430  cefTRIAXone (ROCEPHIN) 1 g in sodium chloride 0.9 % 100 mL IVPB  Status:  Discontinued        1 g 200 mL/hr over 30 Minutes Intravenous Every 24 hours 12/11/21 1423 12/11/21 1424   12/11/21 1130  ceFEPIme (MAXIPIME) 2 g in sodium chloride 0.9 % 100 mL IVPB        2 g 200 mL/hr over 30 Minutes Intravenous  Once 12/11/21 1118 12/11/21 1529   12/11/21 1130  vancomycin (VANCOREADY) IVPB 1500 mg/300 mL        1,500 mg 150 mL/hr over 120 Minutes Intravenous  Once 12/11/21 1118 12/11/21 1845   12/11/21 1045  metroNIDAZOLE (FLAGYL) IVPB 500 mg        500 mg 100 mL/hr over 60 Minutes Intravenous  Once 12/11/21 1032 12/11/21 1200              Family Communication/Anticipated D/C date and plan/Code Status   DVT prophylaxis: heparin injection 5,000 Units Start: 12/11/21 1400     Code Status: Full Code  Family Communication: None Disposition Plan: Plan to discharge home in 2 to 3 days   Status is: Inpatient Remains inpatient appropriate because: IV antibiotics for left upper extremity cellulitis      Subjective:   She still complains of pain and swelling in the left forearm  Objective:    Vitals:   12/16/21 2049 12/17/21 0500 12/17/21 0520 12/17/21 0747  BP: 125/67  (!) 148/61 (!) 165/77  Pulse: 66  75 75  Resp: 20  18 16   Temp: 98.5 F (36.9 C)  98.4 F (36.9 C) 98.6 F (37 C)  TempSrc:      SpO2: 97%  96% 97%  Weight:  76.3 kg    Height:       No data found.  No intake or output data in the 24 hours ending 12/17/21 1155  Filed Weights   12/16/21 0757 12/16/21 1131 12/17/21 0500  Weight: 73 kg 72.2 kg 76.3 kg    Exam:  GEN: NAD SKIN: No rash EYES: EOMI ENT: MMM CV: RRR PULM: CTA B ABD: soft, ND, NT, +BS CNS: AAO x 3, non focal EXT: Persistent left forearm swelling, tenderness and erythema.  Tenderness involving the anterior aspect of the left arm.     Data Reviewed:   I have personally reviewed following labs and imaging  studies:  Labs: Labs show the following:   Basic Metabolic Panel: Recent Labs  Lab 12/11/21 1021 12/12/21 0439 12/15/21 0931  NA 139 137 138  K 4.5 4.5 3.9  CL 103 103 102  CO2 26 24 27   GLUCOSE 157* 93 104*  BUN  38* 44* 36*  CREATININE 6.81* 6.79* 4.61*  CALCIUM 8.5* 8.3* 8.3*  PHOS  --  5.6* 4.7*   GFR Estimated Creatinine Clearance: 13 mL/min (A) (by C-G formula based on SCr of 4.61 mg/dL (H)). Liver Function Tests: Recent Labs  Lab 12/11/21 1021 12/15/21 0931  AST 20  --   ALT 17  --   ALKPHOS 83  --   BILITOT 0.7  --   PROT 5.9*  --   ALBUMIN 2.8* 2.7*   No results for input(s): "LIPASE", "AMYLASE" in the last 168 hours. No results for input(s): "AMMONIA" in the last 168 hours. Coagulation profile Recent Labs  Lab 12/11/21 1021  INR 1.1    CBC: Recent Labs  Lab 12/11/21 1021  WBC 6.4  NEUTROABS 3.8  HGB 10.0*  HCT 32.8*  MCV 97.9  PLT 219   Cardiac Enzymes: No results for input(s): "CKTOTAL", "CKMB", "CKMBINDEX", "TROPONINI" in the last 168 hours. BNP (last 3 results) No results for input(s): "PROBNP" in the last 8760 hours. CBG: Recent Labs  Lab 12/13/21 0905  GLUCAP 104*   D-Dimer: No results for input(s): "DDIMER" in the last 72 hours. Hgb A1c: No results for input(s): "HGBA1C" in the last 72 hours. Lipid Profile: No results for input(s): "CHOL", "HDL", "LDLCALC", "TRIG", "CHOLHDL", "LDLDIRECT" in the last 72 hours. Thyroid function studies: No results for input(s): "TSH", "T4TOTAL", "T3FREE", "THYROIDAB" in the last 72 hours.  Invalid input(s): "FREET3" Anemia work up: No results for input(s): "VITAMINB12", "FOLATE", "FERRITIN", "TIBC", "IRON", "RETICCTPCT" in the last 72 hours. Sepsis Labs: Recent Labs  Lab 12/11/21 1021 12/11/21 1943  PROCALCITON 0.14  --   WBC 6.4  --   LATICACIDVEN 2.2* 0.9    Microbiology Recent Results (from the past 240 hour(s))  Blood Culture (routine x 2)     Status: None   Collection Time:  12/11/21 10:21 AM   Specimen: BLOOD  Result Value Ref Range Status   Specimen Description BLOOD LEFT [facility]  Final   Special Requests   Final    BOTTLES DRAWN AEROBIC AND ANAEROBIC Blood Culture adequate volume   Culture   Final    NO GROWTH 5 DAYS Performed at [facility], [address] [place], [place] [address]    Report Status 12/16/2021 FINAL  Final  Blood Culture (routine x 2)     Status: None   Collection Time: 12/11/21 10:21 AM   Specimen: BLOOD  Result Value Ref Range Status   Specimen Description BLOOD RIGHT ARM  Final   Special Requests   Final    BOTTLES DRAWN AEROBIC AND ANAEROBIC Blood Culture results may not be optimal due to an excessive volume of blood received in culture bottles   Culture   Final    NO GROWTH 5 DAYS Performed at [facility], [address] [place], [place] [address]    Report Status 12/16/2021 FINAL  Final  Urine Culture     Status: Abnormal   Collection Time: 12/11/21  5:36 PM   Specimen: Urine, Random  Result Value Ref Range Status   Specimen Description   Final    URINE, RANDOM Performed at [facility], [address], [place], [place] [address]    Special Requests   Final    NONE Performed at [facility], [address], [place], [place] [address]    Culture >=100,000 COLONIES/mL PROTEUS MIRABILIS (A)  Final   Report Status 12/14/2021 FINAL  Final   Organism ID,  Bacteria PROTEUS MIRABILIS (A)  Final      Susceptibility   Proteus mirabilis - MIC*    AMPICILLIN <=2 SENSITIVE Sensitive     CEFAZOLIN 8 SENSITIVE Sensitive     CEFEPIME <=0.12 SENSITIVE Sensitive     CEFTRIAXONE <=0.25 SENSITIVE Sensitive     CIPROFLOXACIN 1 RESISTANT Resistant     GENTAMICIN <=1 SENSITIVE Sensitive     IMIPENEM 1 SENSITIVE Sensitive     NITROFURANTOIN 128 RESISTANT Resistant     TRIMETH/SULFA <=20 SENSITIVE Sensitive     AMPICILLIN/SULBACTAM <=2 SENSITIVE Sensitive     PIP/TAZO <=4 SENSITIVE Sensitive     *  >=100,000 COLONIES/mL PROTEUS MIRABILIS    Procedures and diagnostic studies:  No results found.             LOS: 6 days   Miamarie Moll  [facility]   Pager on [email or web address]. If 7PM-7AM, please contact night-coverage at [email or web address]     12/17/2021, 11:55 AM

## 2021-12-17 NOTE — Plan of Care (Signed)
  Problem: Education: Goal: Knowledge of General Education information will improve Description: Including pain rating scale, medication(s)/side effects and non-pharmacologic comfort measures Outcome: Progressing   Problem: Health Behavior/Discharge Planning: Goal: Ability to manage health-related needs will improve Outcome: Progressing   Problem: Clinical Measurements: Goal: Ability to maintain clinical measurements within normal limits will improve Outcome: Progressing Goal: Will remain free from infection Outcome: Progressing Goal: Diagnostic test results will improve Outcome: Progressing Goal: Respiratory complications will improve Outcome: Progressing Goal: Cardiovascular complication will be avoided Outcome: Progressing   Problem: Activity: Goal: Risk for activity intolerance will decrease Outcome: Progressing   Problem: Nutrition: Goal: Adequate nutrition will be maintained Outcome: Progressing   Problem: Elimination: Goal: Will not experience complications related to bowel motility Outcome: Progressing   Problem: Pain Managment: Goal: General experience of comfort will improve Outcome: Progressing   Problem: Safety: Goal: Ability to remain free from injury will improve Outcome: Progressing   Problem: Skin Integrity: Goal: Risk for impaired skin integrity will decrease Outcome: Progressing   Problem: Coping: Goal: Level of anxiety will decrease Outcome: Completed/Met

## 2021-12-17 NOTE — Progress Notes (Signed)
Subjective: Interval History: has complaints of continued left arm swelling and pain with motion...   Objective: Vital signs in last 24 hours: Temp:  [98.4 F (36.9 C)-99.1 F (37.3 C)] 98.6 F (37 C) (08/06 0747) Pulse Rate:  [66-86] 75 (08/06 0747) Resp:  [10-21] 16 (08/06 0747) BP: (114-165)/(60-103) 165/77 (08/06 0747) SpO2:  [96 %-100 %] 97 % (08/06 0747) Weight:  [72.2 kg-76.3 kg] 76.3 kg (08/06 0500)  Intake/Output from previous day: 08/05 0701 - 08/06 0700 In: -  Out: 1000  Intake/Output this shift: No intake/output data recorded.  Extremities: Left upper extremity with significant edema beginning just above the antecubital crease extending distally involving the forearm circumferentially.  There is peau d'orange and moderate erythema and warmth.  Pulse examination is normal.  Dialysis access is a palpable thrill.  There is no obvious erythema or induration directly overlying the access graft.  Lab Results: No results for input(s): "WBC", "HGB", "HCT", "PLT" in the last 72 hours. BMET Recent Labs    12/15/21 0931  NA 138  K 3.9  CL 102  CO2 27  GLUCOSE 104*  BUN 36*  CREATININE 4.61*  CALCIUM 8.3*    Studies/Results: MR FOREARM LEFT W WO CONTRAST  Result Date: 12/13/2021 CLINICAL DATA:  Soft tissue mass, forearm, deep; Soft tissue mass, upper arm, deep EXAM: MRI OF THE LEFT FOREARM WITHOUT AND WITH CONTRAST; MRI OF THE LEFT HUMERUS WITHOUT AND WITH CONTRAST TECHNIQUE: Multiplanar, multisequence MR imaging of the left forearm and left humerus was performed before and after the administration of intravenous contrast. CONTRAST:  51mL GADAVIST GADOBUTROL 1 MMOL/ML IV SOLN COMPARISON:  None Available. FINDINGS: Bones/Joint/Cartilage There is no significant marrow signal alteration. The cortex is intact. No aggressive bone lesion. There is a trace elbow joint effusion. Muscles and Tendons There is generalized loss of muscle bulk. There is intramuscular edema within the  brachialis, brachioradialis, and involving the flexor compartment musculature of the forearm with mild interfascial edema. There is no intramuscular collection. Soft tissues There is extensive skin thickening and subcutaneous soft tissue swelling of the left upper extremity, extending from the upper arm through the forearm, and worst along the forearm. There is no well-defined/drainable fluid collection. IMPRESSION: Findings are most consistent with left upper extremity cellulitis with myofasciitis in the distal upper arm and flexor compartment of the forearm. No evidence of osteomyelitis or soft tissue abscess. Trace elbow joint effusion without other convincing findings to suggest septic arthritis. Electronically Signed   By: Maurine Simmering M.D.   On: 12/13/2021 08:28   MR HUMERUS LEFT W WO CONTRAST  Result Date: 12/13/2021 CLINICAL DATA:  Soft tissue mass, forearm, deep; Soft tissue mass, upper arm, deep EXAM: MRI OF THE LEFT FOREARM WITHOUT AND WITH CONTRAST; MRI OF THE LEFT HUMERUS WITHOUT AND WITH CONTRAST TECHNIQUE: Multiplanar, multisequence MR imaging of the left forearm and left humerus was performed before and after the administration of intravenous contrast. CONTRAST:  71mL GADAVIST GADOBUTROL 1 MMOL/ML IV SOLN COMPARISON:  None Available. FINDINGS: Bones/Joint/Cartilage There is no significant marrow signal alteration. The cortex is intact. No aggressive bone lesion. There is a trace elbow joint effusion. Muscles and Tendons There is generalized loss of muscle bulk. There is intramuscular edema within the brachialis, brachioradialis, and involving the flexor compartment musculature of the forearm with mild interfascial edema. There is no intramuscular collection. Soft tissues There is extensive skin thickening and subcutaneous soft tissue swelling of the left upper extremity, extending from the upper arm through the  forearm, and worst along the forearm. There is no well-defined/drainable fluid  collection. IMPRESSION: Findings are most consistent with left upper extremity cellulitis with myofasciitis in the distal upper arm and flexor compartment of the forearm. No evidence of osteomyelitis or soft tissue abscess. Trace elbow joint effusion without other convincing findings to suggest septic arthritis. Electronically Signed   By: Maurine Simmering M.D.   On: 12/13/2021 08:28   US Venous Img Upper Uni Left  Result Date: 12/11/2021 CLINICAL DATA:  Pain and swelling in LEFT arm, dog by 2 weeks ago, has fistula in same arm EXAM: LEFT UPPER EXTREMITY VENOUS DOPPLER ULTRASOUND TECHNIQUE: Gray-scale sonography with graded compression, as well as color Doppler and duplex ultrasound were performed to evaluate the upper extremity deep venous system from the level of the subclavian vein and including the jugular, axillary, basilic, radial, ulnar and upper cephalic vein. Spectral Doppler was utilized to evaluate flow at rest and with distal augmentation maneuvers. COMPARISON:  11/19/2021 FINDINGS: Contralateral Subclavian Vein: Respiratory phasicity is normal and symmetric with the symptomatic side. No evidence of thrombus. Normal compressibility. Internal Jugular Vein: No evidence of thrombus. Normal compressibility, respiratory phasicity and response to augmentation. Subclavian Vein: No evidence of thrombus. Normal compressibility, respiratory phasicity and response to augmentation. Axillary Vein: No evidence of thrombus. Normal compressibility, respiratory phasicity and response to augmentation. Cephalic Vein: No evidence of thrombus. Normal compressibility, respiratory phasicity and response to augmentation. Basilic Vein: No evidence of thrombus. Normal compressibility, respiratory phasicity and response to augmentation. Brachial Veins: No evidence of thrombus. Normal compressibility, respiratory phasicity and response to augmentation. Radial Veins: No evidence of thrombus. Normal compressibility, respiratory  phasicity and response to augmentation. Ulnar Veins: No evidence of thrombus. Normal compressibility, respiratory phasicity and response to augmentation. Venous Reflux:  None visualized. Other Findings: Dialysis AV fistula present, patent. Scattered soft tissue swelling LEFT forearm. IMPRESSION: No evidence of DVT within the LEFT upper extremity. Patent dialysis fistula. Electronically Signed   By: Lavonia Dana M.D.   On: 12/11/2021 11:29   DG Chest Port 1 View  Result Date: 12/11/2021 CLINICAL DATA:  Questionable sepsis. EXAM: PORTABLE CHEST 1 VIEW COMPARISON:  November 23, 2021 FINDINGS: Patient is rotated. Right chest dual lumen catheter with tips projecting over the superior cavoatrial junction/right atrium. Enlarged cardiac silhouette. Aortic atherosclerosis. Linear band of scarring versus atelectasis in the left lung base. Bibasilar atelectasis. No new focal airspace consolidation. No visible pleural effusion or pneumothorax. No acute osseous abnormality. IMPRESSION: Similar bibasilar atelectasis. No new focal airspace consolidation. Electronically Signed   By: Dahlia Bailiff M.D.   On: 12/11/2021 10:58   DG Chest Port 1 View  Addendum Date: 11/23/2021   ADDENDUM REPORT: 11/23/2021 12:17 ADDENDUM: Requested No radiographic evidence of TB identified. Electronically Signed   By: Lavonia Dana M.D.   On: 11/23/2021 12:17   Result Date: 11/23/2021 CLINICAL DATA:  Screening, chronic kidney disease, type II diabetes mellitus, hypertension EXAM: PORTABLE CHEST 1 VIEW COMPARISON:  Portable exam 1107 hours compared to 10/23/2021 FINDINGS: RIGHT jugular Port-A-Cath with tip projecting over cavoatrial junction. Enlargement of cardiac silhouette. Mediastinal contours and pulmonary vascularity normal. Atherosclerotic calcification aorta. Minimal bibasilar atelectasis. Lungs otherwise clear. No acute infiltrate, pleural effusion, or pneumothorax. No acute osseous findings. IMPRESSION: Minimal bibasilar atelectasis.  Aortic Atherosclerosis (ICD10-I70.0). Electronically Signed: By: Lavonia Dana M.D. On: 11/23/2021 11:18   [place] LT UPPER EXTREM LTD SOFT TISSUE NON VASCULAR  Result Date: 11/19/2021 CLINICAL DATA:  Left arm pain, redness, and swelling. EXAM:  ULTRASOUND LEFT UPPER EXTREMITY LIMITED TECHNIQUE: Ultrasound examination of the upper extremity soft tissues was performed in the area of clinical concern. COMPARISON:  None Available. FINDINGS: Focal ultrasound of the areas of concern in the left upper arm and forearm demonstrate no discrete soft tissue mass or fluid collection. IMPRESSION: 1. Negative. No sonographic abnormality at the sites of the palpable abnormalities confirmed by the patient. Electronically Signed   By: Titus Dubin M.D.   On: 11/19/2021 13:35   Anti-infectives: Anti-infectives (From admission, onward)    Start     Dose/Rate Route Frequency Ordered Stop   12/16/21 1200  vancomycin (VANCOREADY) IVPB 750 mg/150 mL  Status:  Discontinued        750 mg 150 mL/hr over 60 Minutes Intravenous Every T-Th-Sa (Hemodialysis) 12/13/21 1235 12/13/21 2135   12/14/21 1000  ceFAZolin (ANCEF) IVPB 1 g/50 mL premix        1 g 100 mL/hr over 30 Minutes Intravenous Daily 12/13/21 2131     12/13/21 1300  vancomycin (VANCOREADY) IVPB 750 mg/150 mL        750 mg 150 mL/hr over 60 Minutes Intravenous  Once 12/13/21 1235 12/14/21 0736   12/13/21 1000  cefTRIAXone (ROCEPHIN) 2 g in sodium chloride 0.9 % 100 mL IVPB  Status:  Discontinued        2 g 200 mL/hr over 30 Minutes Intravenous Every 24 hours 12/12/21 1502 12/13/21 2130   12/12/21 1200  cefTRIAXone (ROCEPHIN) 1 g in sodium chloride 0.9 % 100 mL IVPB  Status:  Discontinued        1 g 200 mL/hr over 30 Minutes Intravenous Every 24 hours 12/11/21 1424 12/12/21 1502   12/12/21 1200  vancomycin (VANCOREADY) IVPB 750 mg/150 mL  Status:  Discontinued        750 mg 150 mL/hr over 60 Minutes Intravenous Every T-Th-Sa (Hemodialysis) 12/11/21 1428 12/13/21  1235   12/11/21 2200  metroNIDAZOLE (FLAGYL) IVPB 500 mg  Status:  Discontinued        500 mg 100 mL/hr over 60 Minutes Intravenous Every 12 hours 12/11/21 1424 12/13/21 2130   12/11/21 1430  cefTRIAXone (ROCEPHIN) 1 g in sodium chloride 0.9 % 100 mL IVPB  Status:  Discontinued        1 g 200 mL/hr over 30 Minutes Intravenous Every 24 hours 12/11/21 1423 12/11/21 1424   12/11/21 1130  ceFEPIme (MAXIPIME) 2 g in sodium chloride 0.9 % 100 mL IVPB        2 g 200 mL/hr over 30 Minutes Intravenous  Once 12/11/21 1118 12/11/21 1529   12/11/21 1130  vancomycin (VANCOREADY) IVPB 1500 mg/300 mL        1,500 mg 150 mL/hr over 120 Minutes Intravenous  Once 12/11/21 1118 12/11/21 1845   12/11/21 1045  metroNIDAZOLE (FLAGYL) IVPB 500 mg        500 mg 100 mL/hr over 60 Minutes Intravenous  Once 12/11/21 1032 12/11/21 1200       Assessment/Plan: Severe cellulitis of the left upper extremity, primarily forearm.  Question of infection of her prosthetic dialysis access remains to be answered but her physical findings are distal to the dialysis access rather than involving the access itself. Continue aggressive empiric antibiotic therapy.  Consider formal fistulography to assess for any venous outflow obstruction that may be worsening her edema and delaying her clinical response.  At present, it does not appear that the dialysis access is infected but this certainly remains to be ruled out.  LOS: 6 days   Bertram Savin 12/17/2021, 9:02 AM

## 2021-12-18 ENCOUNTER — Inpatient Hospital Stay: Payer: Medicaid Other

## 2021-12-18 DIAGNOSIS — N186 End stage renal disease: Secondary | ICD-10-CM | POA: Diagnosis not present

## 2021-12-18 DIAGNOSIS — L03114 Cellulitis of left upper limb: Secondary | ICD-10-CM | POA: Diagnosis not present

## 2021-12-18 DIAGNOSIS — Z992 Dependence on renal dialysis: Secondary | ICD-10-CM | POA: Diagnosis not present

## 2021-12-18 DIAGNOSIS — I5032 Chronic diastolic (congestive) heart failure: Secondary | ICD-10-CM | POA: Diagnosis not present

## 2021-12-18 MED ORDER — TECHNETIUM TC 99M EXAMETAZIME IV KIT
10.0000 | PACK | Freq: Once | INTRAVENOUS | Status: AC | PRN
  Administered 2021-12-18: 9.53 via INTRAVENOUS

## 2021-12-18 NOTE — Plan of Care (Signed)
  Problem: Clinical Measurements: Goal: Ability to maintain clinical measurements within normal limits will improve Outcome: Progressing Goal: Will remain free from infection Outcome: Progressing Goal: Diagnostic test results will improve Outcome: Progressing Goal: Respiratory complications will improve Outcome: Progressing Goal: Cardiovascular complication will be avoided Outcome: Progressing   Problem: Nutrition: Goal: Adequate nutrition will be maintained Outcome: Progressing   Problem: Elimination: Goal: Will not experience complications related to bowel motility Outcome: Progressing   Problem: Pain Managment: Goal: General experience of comfort will improve Outcome: Progressing   Problem: Safety: Goal: Ability to remain free from injury will improve Outcome: Progressing   Problem: Skin Integrity: Goal: Risk for impaired skin integrity will decrease Outcome: Progressing

## 2021-12-18 NOTE — Progress Notes (Signed)
ID Pt is stable Has gone for part 1 of tagged wbc scan  O/e awake and alert BP 120/79 (BP Location: Right Arm)   Pulse 72   Temp 98.1 F (36.7 C)   Resp 17   Ht 5\' 6"  (1.676 m)   Wt 76.3 kg   SpO2 98%   BMI 27.15 kg/m   Left forearm remains swollen , and painful and tender  Swelling of lower third of upper arm and forearm on the left side Erythema and tenderness     Labs    Latest Ref Rng & Units 12/11/2021   10:21 AM 11/25/2021    8:00 AM 11/22/2021    8:50 AM  CBC  WBC 4.0 - 10.5 K/uL 6.4  7.9  7.3   Hemoglobin 12.0 - 15.0 g/dL 10.0  10.2  10.3   Hematocrit 36.0 - 46.0 % 32.8  32.8  33.9   Platelets 150 - 400 K/uL 219  230  281        Latest Ref Rng & Units 12/15/2021    9:31 AM 12/12/2021    4:39 AM 12/11/2021   10:21 AM  CMP  Glucose 70 - 99 mg/dL 104  93  157   BUN 8 - 23 mg/dL 36  44  38   Creatinine 0.44 - 1.00 mg/dL 4.61  6.79  6.81   Sodium 135 - 145 mmol/L 138  137  139   Potassium 3.5 - 5.1 mmol/L 3.9  4.5  4.5   Chloride 98 - 111 mmol/L 102  103  103   CO2 22 - 32 mmol/L 27  24  26    Calcium 8.9 - 10.3 mg/dL 8.3  8.3  8.5   Total Protein 6.5 - 8.1 g/dL   5.9   Total Bilirubin 0.3 - 1.2 mg/dL   0.7   Alkaline Phos 38 - 126 U/L   83   AST 15 - 41 U/L   20   ALT 0 - 44 U/L   17     Micro 12/11/21 BC- Neg  UC- proteus  IMAGING RESULTS: MRI - of the left arm- no abscess I have personally reviewed the films  Impression/recommendation  Swelling left forearm and lower third of upper arm with pain and erythema- looks like lymphedema/venous edema No response to antibiotics- currently on cefazolin, will DC Doppler did not show any thrombus MRI no abscess Undergoing tagged wbc scan   She had a dog bite in may 2023 to the left finger and that was treated and completely resolved But now she has cellulitis recurrent X 3 rd episode in 3 months  Keep the arm elevated   ESRD on dialysis Proteus in the urine-not sure whether this is just colonization-  cefazolin treats this anyway   HTN on coreg   Anemia due to ESRD     Discussed the management with the patient,

## 2021-12-18 NOTE — Progress Notes (Signed)
Progress Note    Katie [name]  LZJ:[id number] DOB: 1956/01/15  DOA: 12/11/2021 PCP: Merryl Hacker, No      Brief Narrative:    Medical records reviewed and are as summarized below:  Katie [name] is a 66 y.o. female with medical history significant for ESRD on hemodialysis, type II DM, hypertension, homelessness, sepsis from pneumonia in June 2022, left upper extremity cellulitis in June 2023, dog bite infection left upper extremity June 2023, chronic disease, aortic stenosis, fecal impaction, recent discharge from the hospital on 11/26/2021.  She has an old left arm fistula packed she has been getting hemodialysis through a right upper chest wall permacath.   She was found to have left upper extremity cellulitis.     Assessment/Plan:   Principal Problem:   Cellulitis of left upper extremity Active Problems:   ESRD (end stage renal disease) on dialysis ([place])   Hypertension   Anemia in ESRD (end-stage renal disease) ([facility])   Elevated lactic acid level   Chronic diastolic CHF (congestive heart failure) (HCC)   Depression, unspecified   Type II diabetes mellitus with renal manifestations ([facility])    Body mass index is 27.15 kg/m.  Recurrent left upper extremity cellulitis: Painful swelling of left forearm with erythema is not improving as expected.  Tagged WBC scan of left arm AV graft is pending.  Continue IV cefazolin for now.  Follow-up with ID and vascular surgeon for further recommendations. She was recently treated with 10-day course of doxycycline and Keflex (completed on 11/18/2021) for left upper extremity cellulitis.  No evidence of DVT or abscess in left upper extremity.   ESRD, anemia of chronic kidney disease, secondary hyperparathyroidism: Plan for hemodialysis tomorrow.  Follow-up with nephrologist.  Constipation: Continue laxatives  Chronic diastolic CHF: Compensated  Lactic acidosis: Resolved  Other comorbidities include depression, hypertension,  Diet Order              Diet renal with fluid restriction Fluid restriction: 1200 mL Fluid; Room service appropriate? Yes; Fluid consistency: Thin  Diet effective now                            Consultants: Nephrologist, ID specialist, vascular surgeon  Procedures: None    Medications:    carvedilol  6.25 mg Oral QPM   Chlorhexidine Gluconate Cloth  6 each Topical Q0600   citalopram  10 mg Oral Daily   furosemide  40 mg Oral Daily   heparin  5,000 Units Subcutaneous Q8H   pantoprazole  40 mg Oral BID   traZODone  50 mg Oral QHS   Continuous Infusions:   ceFAZolin (ANCEF) IV Stopped (12/17/21 1739)     Anti-infectives (From admission, onward)    Start     Dose/Rate Route Frequency Ordered Stop   12/16/21 1200  vancomycin (VANCOREADY) IVPB 750 mg/150 mL  Status:  Discontinued        750 mg 150 mL/hr over 60 Minutes Intravenous Every T-Th-Sa (Hemodialysis) 12/13/21 1235 12/13/21 2135   12/14/21 1000  ceFAZolin (ANCEF) IVPB 1 g/50 mL premix        1 g 100 mL/hr over 30 Minutes Intravenous Daily 12/13/21 2131     12/13/21 1300  vancomycin (VANCOREADY) IVPB 750 mg/150 mL        750 mg 150 mL/hr over 60 Minutes Intravenous  Once 12/13/21 1235 12/14/21 0736   12/13/21 1000  cefTRIAXone (ROCEPHIN) 2 g in sodium chloride 0.9 % 100  mL IVPB  Status:  Discontinued        2 g 200 mL/hr over 30 Minutes Intravenous Every 24 hours 12/12/21 1502 12/13/21 2130   12/12/21 1200  cefTRIAXone (ROCEPHIN) 1 g in sodium chloride 0.9 % 100 mL IVPB  Status:  Discontinued        1 g 200 mL/hr over 30 Minutes Intravenous Every 24 hours 12/11/21 1424 12/12/21 1502   12/12/21 1200  vancomycin (VANCOREADY) IVPB 750 mg/150 mL  Status:  Discontinued        750 mg 150 mL/hr over 60 Minutes Intravenous Every T-Th-Sa (Hemodialysis) 12/11/21 1428 12/13/21 1235   12/11/21 2200  metroNIDAZOLE (FLAGYL) IVPB 500 mg  Status:  Discontinued        500 mg 100 mL/hr over 60 Minutes Intravenous Every 12 hours  12/11/21 1424 12/13/21 2130   12/11/21 1430  cefTRIAXone (ROCEPHIN) 1 g in sodium chloride 0.9 % 100 mL IVPB  Status:  Discontinued        1 g 200 mL/hr over 30 Minutes Intravenous Every 24 hours 12/11/21 1423 12/11/21 1424   12/11/21 1130  ceFEPIme (MAXIPIME) 2 g in sodium chloride 0.9 % 100 mL IVPB        2 g 200 mL/hr over 30 Minutes Intravenous  Once 12/11/21 1118 12/11/21 1529   12/11/21 1130  vancomycin (VANCOREADY) IVPB 1500 mg/300 mL        1,500 mg 150 mL/hr over 120 Minutes Intravenous  Once 12/11/21 1118 12/11/21 1845   12/11/21 1045  metroNIDAZOLE (FLAGYL) IVPB 500 mg        500 mg 100 mL/hr over 60 Minutes Intravenous  Once 12/11/21 1032 12/11/21 1200              Family Communication/Anticipated D/C date and plan/Code Status   DVT prophylaxis: heparin injection 5,000 Units Start: 12/11/21 1400     Code Status: Full Code  Family Communication: None Disposition Plan: Plan to discharge home in 2 to 3 days   Status is: Inpatient Remains inpatient appropriate because: IV antibiotics for left upper extremity cellulitis      Subjective:   Interval events noted.  She complains of constipation and left upper extremity pain.  She thinks this has been only a slight improvement in pain in the left upper extremity.  Objective:    Vitals:   12/17/21 1603 12/17/21 2052 12/18/21 0404 12/18/21 0834  BP: 137/73 (!) 177/71 (!) 162/68 (!) 159/87  Pulse: 76 80 73 70  Resp: 16 17 17 16   Temp: 98.7 F (37.1 C) 98.2 F (36.8 C) 98.1 F (36.7 C) 98 F (36.7 C)  TempSrc:    Oral  SpO2: 97% 97% 98% 99%  Weight:      Height:       No data found.   Intake/Output Summary (Last 24 hours) at 12/18/2021 1133 Last data filed at 12/18/2021 0900 Gross per 24 hour  Intake 870 ml  Output --  Net 870 ml    Filed Weights   12/16/21 0757 12/16/21 1131 12/17/21 0500  Weight: 73 kg 72.2 kg 76.3 kg    Exam:  GEN: NAD SKIN: Warm and dry EYES: No pallor or  icterus ENT: MMM CV: RRR PULM: CTA B ABD: soft, ND, NT, +BS CNS: AAO x 3, non focal EXT: Persistent left forearm swelling, tenderness and erythema.  Left anterior tenderness with some swelling.     Data Reviewed:   I have personally reviewed following labs and imaging studies:  Labs: Labs show the following:   Basic Metabolic Panel: Recent Labs  Lab 12/12/21 0439 12/15/21 0931  NA 137 138  K 4.5 3.9  CL 103 102  CO2 24 27  GLUCOSE 93 104*  BUN 44* 36*  CREATININE 6.79* 4.61*  CALCIUM 8.3* 8.3*  PHOS 5.6* 4.7*   GFR Estimated Creatinine Clearance: 13 mL/min (A) (by C-G formula based on SCr of 4.61 mg/dL (H)). Liver Function Tests: Recent Labs  Lab 12/15/21 0931  ALBUMIN 2.7*   No results for input(s): "LIPASE", "AMYLASE" in the last 168 hours. No results for input(s): "AMMONIA" in the last 168 hours. Coagulation profile No results for input(s): "INR", "PROTIME" in the last 168 hours.   CBC: No results for input(s): "WBC", "NEUTROABS", "HGB", "HCT", "MCV", "PLT" in the last 168 hours.  Cardiac Enzymes: No results for input(s): "CKTOTAL", "CKMB", "CKMBINDEX", "TROPONINI" in the last 168 hours. BNP (last 3 results) No results for input(s): "PROBNP" in the last 8760 hours. CBG: Recent Labs  Lab 12/13/21 0905  GLUCAP 104*   D-Dimer: No results for input(s): "DDIMER" in the last 72 hours. Hgb A1c: No results for input(s): "HGBA1C" in the last 72 hours. Lipid Profile: No results for input(s): "CHOL", "HDL", "LDLCALC", "TRIG", "CHOLHDL", "LDLDIRECT" in the last 72 hours. Thyroid function studies: No results for input(s): "TSH", "T4TOTAL", "T3FREE", "THYROIDAB" in the last 72 hours.  Invalid input(s): "FREET3" Anemia work up: No results for input(s): "VITAMINB12", "FOLATE", "FERRITIN", "TIBC", "IRON", "RETICCTPCT" in the last 72 hours. Sepsis Labs: Recent Labs  Lab 12/11/21 1943  LATICACIDVEN 0.9    Microbiology Recent Results (from the past 240  hour(s))  Blood Culture (routine x 2)     Status: None   Collection Time: 12/11/21 10:21 AM   Specimen: BLOOD  Result Value Ref Range Status   Specimen Description BLOOD LEFT [facility]  Final   Special Requests   Final    BOTTLES DRAWN AEROBIC AND ANAEROBIC Blood Culture adequate volume   Culture   Final    NO GROWTH 5 DAYS Performed at [facility], [address] [place], [place] [address]    Report Status 12/16/2021 FINAL  Final  Blood Culture (routine x 2)     Status: None   Collection Time: 12/11/21 10:21 AM   Specimen: BLOOD  Result Value Ref Range Status   Specimen Description BLOOD RIGHT ARM  Final   Special Requests   Final    BOTTLES DRAWN AEROBIC AND ANAEROBIC Blood Culture results may not be optimal due to an excessive volume of blood received in culture bottles   Culture   Final    NO GROWTH 5 DAYS Performed at [facility], [address], [place], [place] [address]    Report Status 12/16/2021 FINAL  Final  Urine Culture     Status: Abnormal   Collection Time: 12/11/21  5:36 PM   Specimen: Urine, Random  Result Value Ref Range Status   Specimen Description   Final    URINE, RANDOM Performed at [facility], [address], [place], [place] [address]    Special Requests   Final    NONE Performed at [facility], [address] [place], [place] [address]    Culture >=100,000 COLONIES/mL PROTEUS MIRABILIS (A)  Final   Report Status 12/14/2021 FINAL  Final   Organism ID, Bacteria PROTEUS MIRABILIS (A)  Final      Susceptibility   Proteus mirabilis - MIC*    AMPICILLIN <=2  SENSITIVE Sensitive     CEFAZOLIN 8 SENSITIVE Sensitive     CEFEPIME <=0.12 SENSITIVE Sensitive     CEFTRIAXONE <=0.25 SENSITIVE Sensitive     CIPROFLOXACIN 1 RESISTANT Resistant     GENTAMICIN <=1 SENSITIVE Sensitive     IMIPENEM 1 SENSITIVE Sensitive     NITROFURANTOIN 128 RESISTANT Resistant     TRIMETH/SULFA <=20 SENSITIVE Sensitive      AMPICILLIN/SULBACTAM <=2 SENSITIVE Sensitive     PIP/TAZO <=4 SENSITIVE Sensitive     * >=100,000 COLONIES/mL PROTEUS MIRABILIS    Procedures and diagnostic studies:  No results found.             LOS: 7 days   Mandrell Vangilder  [facility]   Pager on [email or web address]. If 7PM-7AM, please contact night-coverage at [email or web address]     12/18/2021, 11:33 AM

## 2021-12-18 NOTE — Progress Notes (Signed)
[facility]  ROUNDING NOTE   Subjective:   Katie [name] is a 66 year old female with past medical history of ESRD with hemodialysis TTS, diabetes mellitus, and hypertension. She presents to ED with worsening left lower arm swelling and redness. Patient has been admitted for ESRD (end stage renal disease) on dialysis ([place]) [N18.6, Z99.2] Cellulitis of left upper extremity [L03.114] Left arm cellulitis [X38.182]  Patient is known to our practice and receives outpatient dialysis treatments at [facility] on a TTS schedule, supervised by Dr. Holley Raring.  Patient missed treatment on Saturday 12/09/2021 due to transportation issues.    Update: Patient resting quietly. States she did not rest well overnight Complains of left arm soreness.  Appetite remains intact Scheduled for dialysis tomorrow   Objective:  Vital signs in last 24 hours:  Temp:  [98 F (36.7 C)-98.7 F (37.1 C)] 98 F (36.7 C) (08/07 0834) Pulse Rate:  [70-80] 70 (08/07 0834) Resp:  [16-17] 16 (08/07 0834) BP: (137-177)/(68-87) 159/87 (08/07 0834) SpO2:  [97 %-99 %] 99 % (08/07 0834)  Weight change:  Filed Weights   12/16/21 0757 12/16/21 1131 12/17/21 0500  Weight: 73 kg 72.2 kg 76.3 kg    Intake/Output: I/O last 3 completed shifts: In: 53 [P.O.:480; IV Piggyback:150] Out: -    Intake/Output this shift:  Total I/O In: 240 [P.O.:240] Out: -   Physical Exam: General: NAD  Head: Normocephalic, atraumatic. Moist oral mucosal membranes  Eyes: Anicteric  Lungs:  Clear to auscultation, normal effort, room air  Heart: Regular rate and rhythm  Abdomen:  Soft, nontender  Extremities: No lower extremity peripheral edema.  Left upper extremity: 3+ edema, warm and erythematous.   Neurologic: Nonfocal, moving all four extremities  Skin: No lesions, left upper extremity erythema  Access: Right chest PermCath    Basic Metabolic Panel: Recent Labs  Lab 12/12/21 0439 12/15/21 0931  NA 137 138   K 4.5 3.9  CL 103 102  CO2 24 27  GLUCOSE 93 104*  BUN 44* 36*  CREATININE 6.79* 4.61*  CALCIUM 8.3* 8.3*  PHOS 5.6* 4.7*     Liver Function Tests: Recent Labs  Lab 12/15/21 0931  ALBUMIN 2.7*    No results for input(s): "LIPASE", "AMYLASE" in the last 168 hours. No results for input(s): "AMMONIA" in the last 168 hours.  CBC: No results for input(s): "WBC", "NEUTROABS", "HGB", "HCT", "MCV", "PLT" in the last 168 hours.   Cardiac Enzymes: No results for input(s): "CKTOTAL", "CKMB", "CKMBINDEX", "TROPONINI" in the last 168 hours.  BNP: Invalid input(s): "POCBNP"  CBG: Recent Labs  Lab 12/13/21 0905  GLUCAP 104*     Microbiology: Results for orders placed or performed during the hospital encounter of 12/11/21  Blood Culture (routine x 2)     Status: None   Collection Time: 12/11/21 10:21 AM   Specimen: BLOOD  Result Value Ref Range Status   Specimen Description BLOOD LEFT [facility]  Final   Special Requests   Final    BOTTLES DRAWN AEROBIC AND ANAEROBIC Blood Culture adequate volume   Culture   Final    NO GROWTH 5 DAYS Performed at [facility], [address] [place], [place] [address]    Report Status 12/16/2021 FINAL  Final  Blood Culture (routine x 2)     Status: None   Collection Time: 12/11/21 10:21 AM   Specimen: BLOOD  Result Value Ref Range Status   Specimen Description BLOOD RIGHT ARM  Final   Special Requests  Final    BOTTLES DRAWN AEROBIC AND ANAEROBIC Blood Culture results may not be optimal due to an excessive volume of blood received in culture bottles   Culture   Final    NO GROWTH 5 DAYS Performed at [facility], [address] [place], [place] [address]    Report Status 12/16/2021 FINAL  Final  Urine Culture     Status: Abnormal   Collection Time: 12/11/21  5:36 PM   Specimen: Urine, Random  Result Value Ref Range Status   Specimen Description   Final    URINE, RANDOM Performed at [facility], [address], [place], [place] [address]    Special Requests   Final    NONE Performed at [facility], [address] [place],  [address]    Culture >=100,000 COLONIES/mL PROTEUS MIRABILIS (A)  Final   Report Status 12/14/2021 FINAL  Final   Organism ID, Bacteria PROTEUS MIRABILIS (A)  Final      Susceptibility   Proteus mirabilis - MIC*    AMPICILLIN <=2 SENSITIVE Sensitive     CEFAZOLIN 8 SENSITIVE Sensitive     CEFEPIME <=0.12 SENSITIVE Sensitive     CEFTRIAXONE <=0.25 SENSITIVE Sensitive     CIPROFLOXACIN 1 RESISTANT Resistant     GENTAMICIN <=1 SENSITIVE Sensitive     IMIPENEM 1 SENSITIVE Sensitive     NITROFURANTOIN 128 RESISTANT Resistant     TRIMETH/SULFA <=20 SENSITIVE Sensitive     AMPICILLIN/SULBACTAM <=2 SENSITIVE Sensitive     PIP/TAZO <=4 SENSITIVE Sensitive     * >=100,000 COLONIES/mL PROTEUS MIRABILIS    Coagulation Studies: No results for input(s): "LABPROT", "INR" in the last 72 hours.   Urinalysis: No results for input(s): "COLORURINE", "LABSPEC", "PHURINE", "GLUCOSEU", "HGBUR", "BILIRUBINUR", "KETONESUR", "PROTEINUR", "UROBILINOGEN", "NITRITE", "LEUKOCYTESUR" in the last 72 hours.  Invalid input(s): "APPERANCEUR"     Imaging: No results found.   Medications:     ceFAZolin (ANCEF) IV Stopped (12/17/21 1739)    carvedilol  6.25 mg Oral QPM   Chlorhexidine Gluconate Cloth  6 each Topical Q0600   citalopram  10 mg Oral Daily   furosemide  40 mg Oral Daily   heparin  5,000 Units Subcutaneous Q8H   pantoprazole  40 mg Oral BID   traZODone  50 mg Oral QHS   acetaminophen, bisacodyl, diphenhydrAMINE, guaiFENesin-dextromethorphan, hydrALAZINE, HYDROmorphone (DILAUDID) injection, ondansetron (ZOFRAN) IV, ondansetron (ZOFRAN) IV, oxyCODONE-acetaminophen, senna-docusate, tiZANidine, topiramate  Assessment/ Plan:  Katie [name] is a 66 y.o.  female   with past medical history of ESRD with hemodialysis TTS, diabetes mellitus, and  hypertension. She presents to ED with worsening left lower arm swelling and redness. Patient has been admitted for ESRD (end stage renal disease) on dialysis ([place]) [N18.6, Z99.2] Cellulitis of left upper extremity [L03.114] Left arm cellulitis [R51.884]   [facility]/TTS/right PermCath/83.5 kg  End-stage renal disease on hemodialysis.  Will maintain outpatient schedule if possible. Next treatment scheduled for Tuesday.   2. Anemia of chronic kidney disease Lab Results  Component Value Date   HGB 10.0 (L) 12/11/2021    Patient receives [place] outpatient.  Hemoglobin stable  3. Secondary Hyperparathyroidism: PTH 497 on 12/07/21 Lab Results  Component Value Date   PTH 70 (H) 05/12/2020   CALCIUM 8.3 (L) 12/15/2021   PHOS 4.7 (H) 12/15/2021    We will continue to monitor bone minerals during this admission.   4.  Hypertension with chronic kidney disease.  Home regimen includes carvedilol and furosemide  40 mg daily.  Currently receiving these inpatient.  Blood pressure 159/87  5.  Left arm cellulitis evidenced by edema, erythema, warm to touch.  Likely secondary to wound infection.  MRI with contrast completed on 12/12/2021 confirming cellulitis, no osteomyelitis noted.  Obtaining WBC scan for further evaluation. Planning for scan on Monday. Infectious disease changed patient to cefazolin.    LOS: 7 Brenn Deziel 8/7/20232:13 PM

## 2021-12-19 DIAGNOSIS — Z992 Dependence on renal dialysis: Secondary | ICD-10-CM | POA: Diagnosis not present

## 2021-12-19 DIAGNOSIS — N186 End stage renal disease: Secondary | ICD-10-CM | POA: Diagnosis not present

## 2021-12-19 DIAGNOSIS — L03114 Cellulitis of left upper limb: Secondary | ICD-10-CM | POA: Diagnosis not present

## 2021-12-19 LAB — RENAL FUNCTION PANEL
Albumin: 2.6 g/dL — ABNORMAL LOW (ref 3.5–5.0)
Anion gap: 9 (ref 5–15)
BUN: 42 mg/dL — ABNORMAL HIGH (ref 8–23)
CO2: 26 mmol/L (ref 22–32)
Calcium: 9 mg/dL (ref 8.9–10.3)
Chloride: 103 mmol/L (ref 98–111)
Creatinine, Ser: 5.78 mg/dL — ABNORMAL HIGH (ref 0.44–1.00)
GFR, Estimated: 8 mL/min — ABNORMAL LOW (ref >60)
Glucose, Bld: 107 mg/dL — ABNORMAL HIGH (ref 70–99)
Phosphorus: 4.3 mg/dL (ref 2.5–4.6)
Potassium: 4.2 mmol/L (ref 3.5–5.1)
Sodium: 138 mmol/L (ref 135–145)

## 2021-12-19 LAB — CBC
HCT: 31.4 % — ABNORMAL LOW (ref 36.0–46.0)
Hemoglobin: 9.5 g/dL — ABNORMAL LOW (ref 12.0–15.0)
MCH: 29.7 pg (ref 26.0–34.0)
MCHC: 30.3 g/dL (ref 30.0–36.0)
MCV: 98.1 fL (ref 80.0–100.0)
Platelets: 181 10*3/uL (ref 150–400)
RBC: 3.2 MIL/uL — ABNORMAL LOW (ref 3.87–5.11)
RDW: 15.3 % (ref 11.5–15.5)
WBC: 5.9 10*3/uL (ref 4.0–10.5)
nRBC: 0 % (ref 0.0–0.2)

## 2021-12-19 LAB — HEPATITIS B CORE ANTIBODY, TOTAL: Hep B Core Total Ab: NONREACTIVE

## 2021-12-19 LAB — HEPATITIS B CORE ANTIBODY, IGM: Hep B C IgM: NONREACTIVE

## 2021-12-19 MED ORDER — HEPARIN SODIUM (PORCINE) 1000 UNIT/ML IJ SOLN
INTRAMUSCULAR | Status: AC
  Filled 2021-12-19: qty 10

## 2021-12-19 MED ORDER — ONDANSETRON HCL 4 MG/2ML IJ SOLN
INTRAMUSCULAR | Status: AC
  Filled 2021-12-19: qty 2

## 2021-12-19 NOTE — Progress Notes (Signed)
Pt UF off d/t drop in bp. Sbp 90. Gwyneth Revels, NP present and aware.

## 2021-12-19 NOTE — Progress Notes (Signed)
Post HD vitals 

## 2021-12-19 NOTE — Progress Notes (Signed)
[facility]  ROUNDING NOTE   Subjective:   Solymar Grace is a 66 year old female with past medical history of ESRD with hemodialysis TTS, diabetes mellitus, and hypertension. She presents to ED with worsening left lower arm swelling and redness. Patient has been admitted for ESRD (end stage renal disease) on dialysis ([place]) [N18.6, Z99.2] Cellulitis of left upper extremity [L03.114] Left arm cellulitis [Q68.341]  Patient is known to our practice and receives outpatient dialysis treatments at [facility] on a TTS schedule, supervised by Dr. Holley Raring.  Patient missed treatment on Saturday 12/09/2021 due to transportation issues.    Update: Patient seen and evaluated during dialysis   HEMODIALYSIS FLOWSHEET:  Blood Flow Rate (mL/min): 400 mL/min Arterial Pressure (mmHg): -160 mmHg Venous Pressure (mmHg): 170 mmHg TMP (mmHg): 1 mmHg Ultrafiltration Rate (mL/min): 480 mL/min Dialysate Flow Rate (mL/min): 300 ml/min Dialysis Fluid Bolus: Normal Saline Bolus Amount (mL): 300 mL  No new complaints at this time Patient is anxious for discharge planning   Objective:  Vital signs in last 24 hours:  Temp:  [98 F (36.7 C)-98.7 F (37.1 C)] 98.1 F (36.7 C) (08/08 0745) Pulse Rate:  [64-78] 69 (08/08 1100) Resp:  [11-21] 11 (08/08 1100) BP: (92-186)/(47-84) 127/81 (08/08 1100) SpO2:  [97 %-100 %] 100 % (08/08 1100) Weight:  [73.5 kg] 73.5 kg (08/08 0745)  Weight change:  Filed Weights   12/16/21 1131 12/17/21 0500 12/19/21 0745  Weight: 72.2 kg 76.3 kg 73.5 kg    Intake/Output: I/O last 3 completed shifts: In: 9622 [P.O.:720; I.V.:30; Other:400; IV Piggyback:200] Out: -    Intake/Output this shift:  No intake/output data recorded.  Physical Exam: General: NAD  Head: Normocephalic, atraumatic. Moist oral mucosal membranes  Eyes: Anicteric  Lungs:  Clear to auscultation, normal effort, room air  Heart: Regular rate and rhythm  Abdomen:  Soft, nontender   Extremities: No lower extremity peripheral edema.  Left upper extremity: 3+ edema, warm and erythematous.   Neurologic: Nonfocal, moving all four extremities  Skin: No lesions, left upper extremity erythema  Access: Right chest PermCath    Basic Metabolic Panel: Recent Labs  Lab 12/15/21 0931 12/19/21 0800  NA 138 138  K 3.9 4.2  CL 102 103  CO2 27 26  GLUCOSE 104* 107*  BUN 36* 42*  CREATININE 4.61* 5.78*  CALCIUM 8.3* 9.0  PHOS 4.7* 4.3     Liver Function Tests: Recent Labs  Lab 12/15/21 0931 12/19/21 0800  ALBUMIN 2.7* 2.6*    No results for input(s): "LIPASE", "AMYLASE" in the last 168 hours. No results for input(s): "AMMONIA" in the last 168 hours.  CBC: Recent Labs  Lab 12/19/21 0800  WBC 5.9  HGB 9.5*  HCT 31.4*  MCV 98.1  PLT 181     Cardiac Enzymes: No results for input(s): "CKTOTAL", "CKMB", "CKMBINDEX", "TROPONINI" in the last 168 hours.  BNP: Invalid input(s): "POCBNP"  CBG: Recent Labs  Lab 12/13/21 0905  GLUCAP 104*     Microbiology: Results for orders placed or performed during the hospital encounter of 12/11/21  Blood Culture (routine x 2)     Status: None   Collection Time: 12/11/21 10:21 AM   Specimen: BLOOD  Result Value Ref Range Status   Specimen Description BLOOD LEFT [facility]  Final   Special Requests   Final    BOTTLES DRAWN AEROBIC AND ANAEROBIC Blood Culture adequate volume   Culture   Final    NO GROWTH 5 DAYS Performed at [facility],  [place], [place] [address]    Report Status 12/16/2021 FINAL  Final  Blood Culture (routine x 2)     Status: None   Collection Time: 12/11/21 10:21 AM   Specimen: BLOOD  Result Value Ref Range Status   Specimen Description BLOOD RIGHT ARM  Final   Special Requests   Final    BOTTLES DRAWN AEROBIC AND ANAEROBIC Blood Culture results may not be optimal due to an excessive volume of blood received in culture bottles   Culture   Final    NO GROWTH 5  DAYS Performed at [facility], [address] [place], [place] [address]    Report Status 12/16/2021 FINAL  Final  Urine Culture     Status: Abnormal   Collection Time: 12/11/21  5:36 PM   Specimen: Urine, Random  Result Value Ref Range Status   Specimen Description   Final    URINE, RANDOM Performed at [facility], [address], [place], [place] [address]    Special Requests   Final    NONE Performed at [facility], [address], [place], [place] [address]    Culture >=100,000 COLONIES/mL PROTEUS MIRABILIS (A)  Final   Report Status 12/14/2021 FINAL  Final   Organism ID, Bacteria PROTEUS MIRABILIS (A)  Final      Susceptibility   Proteus mirabilis - MIC*    AMPICILLIN <=2 SENSITIVE Sensitive     CEFAZOLIN 8 SENSITIVE Sensitive     CEFEPIME <=0.12 SENSITIVE Sensitive     CEFTRIAXONE <=0.25 SENSITIVE Sensitive     CIPROFLOXACIN 1 RESISTANT Resistant     GENTAMICIN <=1 SENSITIVE Sensitive     IMIPENEM 1 SENSITIVE Sensitive     NITROFURANTOIN 128 RESISTANT Resistant     TRIMETH/SULFA <=20 SENSITIVE Sensitive     AMPICILLIN/SULBACTAM <=2 SENSITIVE Sensitive     PIP/TAZO <=4 SENSITIVE Sensitive     * >=100,000 COLONIES/mL PROTEUS MIRABILIS    Coagulation Studies: No results for input(s): "LABPROT", "INR" in the last 72 hours.   Urinalysis: No results for input(s): "COLORURINE", "LABSPEC", "PHURINE", "GLUCOSEU", "HGBUR", "BILIRUBINUR", "KETONESUR", "PROTEINUR", "UROBILINOGEN", "NITRITE", "LEUKOCYTESUR" in the last 72 hours.  Invalid input(s): "APPERANCEUR"     Imaging: No results found.   Medications:     ceFAZolin (ANCEF) IV Stopped (12/18/21 1920)    carvedilol  6.25 mg Oral QPM   Chlorhexidine Gluconate Cloth  6 each Topical Q0600   citalopram  10 mg Oral Daily   furosemide  40 mg Oral Daily   heparin  5,000 Units Subcutaneous Q8H   heparin sodium (porcine)       ondansetron       pantoprazole  40 mg Oral BID   traZODone   50 mg Oral QHS   acetaminophen, bisacodyl, diphenhydrAMINE, guaiFENesin-dextromethorphan, heparin sodium (porcine), hydrALAZINE, HYDROmorphone (DILAUDID) injection, ondansetron, ondansetron (ZOFRAN) IV, ondansetron (ZOFRAN) IV, oxyCODONE-acetaminophen, senna-docusate, tiZANidine, topiramate  Assessment/ Plan:  Ms. Quamesha Mullet is a 66 y.o.  female   with past medical history of ESRD with hemodialysis TTS, diabetes mellitus, and hypertension. She presents to ED with worsening left lower arm swelling and redness. Patient has been admitted for ESRD (end stage renal disease) on dialysis ([place]) [N18.6, Z99.2] Cellulitis of left upper extremity [L03.114] Left arm cellulitis [S56.812]   [facility]/TTS/right PermCath/83.5 kg  End-stage renal disease on hemodialysis.  Will maintain outpatient schedule if possible.  Currently receiving scheduled dialysis treatment, UF goal 1.5 L as tolerated.  UF reduced to  1 L due to hypotension and complaints of nausea.  Next treatment scheduled for Thursday.  2. Anemia of chronic kidney disease Lab Results  Component Value Date   HGB 9.5 (L) 12/19/2021    Patient receives [place] outpatient.  Hemoglobin remains within acceptable range.  3. Secondary Hyperparathyroidism: PTH 497 on 12/07/21 Lab Results  Component Value Date   PTH 70 (H) 05/12/2020   CALCIUM 9.0 12/19/2021   PHOS 4.3 12/19/2021  Calcium and phosphorus within desired goal.  4.  Hypertension with chronic kidney disease.  Home regimen includes carvedilol and furosemide 40 mg daily.  Currently receiving these inpatient.  Blood pressure 114/99 during dialysis.   5.  Left arm cellulitis evidenced by edema, erythema, warm to touch.  Likely secondary to wound infection.  MRI with contrast completed on 12/12/2021 confirming cellulitis, no osteomyelitis noted.  WBC scan completed on Monday.  Infectious disease recommending cefazolin and elevation of extremity.    LOS: 8 Vermell Madrid 8/8/202311:24 AM

## 2021-12-19 NOTE — Progress Notes (Addendum)
Progress Note    Katie [name]  VOJ:[id number] DOB: [birth year]  DOA: 12/11/2021 PCP: Merryl Hacker, No      Brief Narrative:    Medical records reviewed and are as summarized below:  Katie [name] is a 66 y.o. female with medical history significant for ESRD on hemodialysis, type II DM, hypertension, homelessness, sepsis from pneumonia in June 2022, left upper extremity cellulitis in June 2023, dog bite infection left upper extremity June 2023, chronic disease, aortic stenosis, fecal impaction, recent discharge from the hospital on 11/26/2021.  She has an old left arm fistula packed she has been getting hemodialysis through a right upper chest wall permacath.   She was found to have left upper extremity cellulitis.     Assessment/Plan:   Principal Problem:   Cellulitis of left upper extremity Active Problems:   ESRD (end stage renal disease) on dialysis ([place])   Hypertension   Anemia in ESRD (end-stage renal disease) (HCC)   Elevated lactic acid level   Chronic diastolic CHF (congestive heart failure) (HCC)   Depression, unspecified   Type II diabetes mellitus with renal manifestations (HCC)    Body mass index is 25.58 kg/m.  Recurrent left upper extremity cellulitis: Painful swelling of left forearm with erythema is not improving as expected.  Tagged WBC scan of left arm was negative.  Discontinue IV cefazolin per Dr. Delaine Lame.  Lymphedema/venous edema, malfunctioning left AV fistula suspected because of poor response to antibiotics.  Dr. Lucky Cowboy, vascular surgeon, will set up fistulogram this week. She was recently treated with 10-day course of doxycycline and Keflex (completed on 11/18/2021) for left upper extremity cellulitis.  No evidence of DVT or abscess in left upper extremity.   ESRD, anemia of chronic kidney disease, secondary hyperparathyroidism: She had hemodialysis today.  Follow-up with nephrologist for hemodialysis.  Constipation: Continue laxatives as  needed  Chronic diastolic CHF: Compensated  Lactic acidosis: Resolved  Other comorbidities include depression, hypertension,  Diet Order             Diet renal with fluid restriction Fluid restriction: 1200 mL Fluid; Room service appropriate? Yes; Fluid consistency: Thin  Diet effective now                            Consultants: Nephrologist, ID specialist, vascular surgeon  Procedures: None    Medications:    carvedilol  6.25 mg Oral QPM   Chlorhexidine Gluconate Cloth  6 each Topical Q0600   citalopram  10 mg Oral Daily   furosemide  40 mg Oral Daily   heparin  5,000 Units Subcutaneous Q8H   heparin sodium (porcine)       ondansetron       pantoprazole  40 mg Oral BID   traZODone  50 mg Oral QHS   Continuous Infusions:   ceFAZolin (ANCEF) IV Stopped (12/18/21 1920)     Anti-infectives (From admission, onward)    Start     Dose/Rate Route Frequency Ordered Stop   12/16/21 1200  vancomycin (VANCOREADY) IVPB 750 mg/150 mL  Status:  Discontinued        750 mg 150 mL/hr over 60 Minutes Intravenous Every T-Th-Sa (Hemodialysis) 12/13/21 1235 12/13/21 2135   12/14/21 1000  ceFAZolin (ANCEF) IVPB 1 g/50 mL premix        1 g 100 mL/hr over 30 Minutes Intravenous Daily 12/13/21 2131     12/13/21 1300  vancomycin (VANCOREADY) IVPB 750 mg/150 mL  750 mg 150 mL/hr over 60 Minutes Intravenous  Once 12/13/21 1235 12/14/21 0736   12/13/21 1000  cefTRIAXone (ROCEPHIN) 2 g in sodium chloride 0.9 % 100 mL IVPB  Status:  Discontinued        2 g 200 mL/hr over 30 Minutes Intravenous Every 24 hours 12/12/21 1502 12/13/21 2130   12/12/21 1200  cefTRIAXone (ROCEPHIN) 1 g in sodium chloride 0.9 % 100 mL IVPB  Status:  Discontinued        1 g 200 mL/hr over 30 Minutes Intravenous Every 24 hours 12/11/21 1424 12/12/21 1502   12/12/21 1200  vancomycin (VANCOREADY) IVPB 750 mg/150 mL  Status:  Discontinued        750 mg 150 mL/hr over 60 Minutes Intravenous Every  T-Th-Sa (Hemodialysis) 12/11/21 1428 12/13/21 1235   12/11/21 2200  metroNIDAZOLE (FLAGYL) IVPB 500 mg  Status:  Discontinued        500 mg 100 mL/hr over 60 Minutes Intravenous Every 12 hours 12/11/21 1424 12/13/21 2130   12/11/21 1430  cefTRIAXone (ROCEPHIN) 1 g in sodium chloride 0.9 % 100 mL IVPB  Status:  Discontinued        1 g 200 mL/hr over 30 Minutes Intravenous Every 24 hours 12/11/21 1423 12/11/21 1424   12/11/21 1130  ceFEPIme (MAXIPIME) 2 g in sodium chloride 0.9 % 100 mL IVPB        2 g 200 mL/hr over 30 Minutes Intravenous  Once 12/11/21 1118 12/11/21 1529   12/11/21 1130  vancomycin (VANCOREADY) IVPB 1500 mg/300 mL        1,500 mg 150 mL/hr over 120 Minutes Intravenous  Once 12/11/21 1118 12/11/21 1845   12/11/21 1045  metroNIDAZOLE (FLAGYL) IVPB 500 mg        500 mg 100 mL/hr over 60 Minutes Intravenous  Once 12/11/21 1032 12/11/21 1200              Family Communication/Anticipated D/C date and plan/Code Status   DVT prophylaxis: heparin injection 5,000 Units Start: 12/11/21 1400     Code Status: Full Code  Family Communication: None Disposition Plan: Plan to discharge home in 2 to 3 days   Status is: Inpatient Remains inpatient appropriate because: IV antibiotics for left upper extremity cellulitis      Subjective:   She complains of pain in the left forearm.  Objective:    Vitals:   12/19/21 1133 12/19/21 1134 12/19/21 1142 12/19/21 1217  BP:  120/68 128/73 (!) 150/71  Pulse:  72 78 82  Resp:  14 18 16   Temp:   97.9 F (36.6 C) 98.3 F (36.8 C)  TempSrc: Oral  Oral   SpO2: 100% 100% 96% 98%  Weight:   71.9 kg   Height:       No data found.   Intake/Output Summary (Last 24 hours) at 12/19/2021 1311 Last data filed at 12/19/2021 1133 Gross per 24 hour  Intake 480 ml  Output 1000 ml  Net -520 ml    Filed Weights   12/17/21 0500 12/19/21 0745 12/19/21 1142  Weight: 76.3 kg 73.5 kg 71.9 kg    Exam:  GEN: NAD SKIN: Warm and  dry EYES: EOMI ENT: MMM CV: RRR PULM: CTA B ABD: soft, ND, NT, +BS CNS: AAO x 3, non focal EXT: Left forearm is still swollen, tender and erythematous although no erythema and swelling appears to be slowly improving.        Data Reviewed:   I have personally reviewed  following labs and imaging studies:  Labs: Labs show the following:   Basic Metabolic Panel: Recent Labs  Lab 12/15/21 0931 12/19/21 0800  NA 138 138  K 3.9 4.2  CL 102 103  CO2 27 26  GLUCOSE 104* 107*  BUN 36* 42*  CREATININE 4.61* 5.78*  CALCIUM 8.3* 9.0  PHOS 4.7* 4.3   GFR Estimated Creatinine Clearance: 10.1 mL/min (A) (by C-G formula based on SCr of 5.78 mg/dL (H)). Liver Function Tests: Recent Labs  Lab 12/15/21 0931 12/19/21 0800  ALBUMIN 2.7* 2.6*   No results for input(s): "LIPASE", "AMYLASE" in the last 168 hours. No results for input(s): "AMMONIA" in the last 168 hours. Coagulation profile No results for input(s): "INR", "PROTIME" in the last 168 hours.   CBC: Recent Labs  Lab 12/19/21 0800  WBC 5.9  HGB 9.5*  HCT 31.4*  MCV 98.1  PLT 181    Cardiac Enzymes: No results for input(s): "CKTOTAL", "CKMB", "CKMBINDEX", "TROPONINI" in the last 168 hours. BNP (last 3 results) No results for input(s): "PROBNP" in the last 8760 hours. CBG: Recent Labs  Lab 12/13/21 0905  GLUCAP 104*   D-Dimer: No results for input(s): "DDIMER" in the last 72 hours. Hgb A1c: No results for input(s): "HGBA1C" in the last 72 hours. Lipid Profile: No results for input(s): "CHOL", "HDL", "LDLCALC", "TRIG", "CHOLHDL", "LDLDIRECT" in the last 72 hours. Thyroid function studies: No results for input(s): "TSH", "T4TOTAL", "T3FREE", "THYROIDAB" in the last 72 hours.  Invalid input(s): "FREET3" Anemia work up: No results for input(s): "VITAMINB12", "FOLATE", "FERRITIN", "TIBC", "IRON", "RETICCTPCT" in the last 72 hours. Sepsis Labs: Recent Labs  Lab 12/19/21 0800  WBC 5.9     Microbiology Recent Results (from the past 240 hour(s))  Blood Culture (routine x 2)     Status: None   Collection Time: 12/11/21 10:21 AM   Specimen: BLOOD  Result Value Ref Range Status   Specimen Description BLOOD LEFT [facility]  Final   Special Requests   Final    BOTTLES DRAWN AEROBIC AND ANAEROBIC Blood Culture adequate volume   Culture   Final    NO GROWTH 5 DAYS Performed at [facility], [address] [place], [place] [address]    Report Status 12/16/2021 FINAL  Final  Blood Culture (routine x 2)     Status: None   Collection Time: 12/11/21 10:21 AM   Specimen: BLOOD  Result Value Ref Range Status   Specimen Description BLOOD RIGHT ARM  Final   Special Requests   Final    BOTTLES DRAWN AEROBIC AND ANAEROBIC Blood Culture results may not be optimal due to an excessive volume of blood received in culture bottles   Culture   Final    NO GROWTH 5 DAYS Performed at [facility], [address] [place], [place] [address]    Report Status 12/16/2021 FINAL  Final  Urine Culture     Status: Abnormal   Collection Time: 12/11/21  5:36 PM   Specimen: Urine, Random  Result Value Ref Range Status   Specimen Description   Final    URINE, RANDOM Performed at [facility], [address] [place], [place] [address]    Special Requests   Final    NONE Performed at [facility], [address] [place], [place] [address]    Culture >=100,000 COLONIES/mL PROTEUS MIRABILIS (A)  Final   Report Status 12/14/2021 FINAL  Final   Organism ID, Bacteria PROTEUS MIRABILIS (A)  Final  Susceptibility   Proteus mirabilis - MIC*    AMPICILLIN <=2 SENSITIVE Sensitive     CEFAZOLIN 8 SENSITIVE Sensitive     CEFEPIME <=0.12 SENSITIVE Sensitive     CEFTRIAXONE <=0.25 SENSITIVE Sensitive     CIPROFLOXACIN 1 RESISTANT Resistant     GENTAMICIN <=1 SENSITIVE Sensitive     IMIPENEM 1 SENSITIVE Sensitive     NITROFURANTOIN 128 RESISTANT Resistant      TRIMETH/SULFA <=20 SENSITIVE Sensitive     AMPICILLIN/SULBACTAM <=2 SENSITIVE Sensitive     PIP/TAZO <=4 SENSITIVE Sensitive     * >=100,000 COLONIES/mL PROTEUS MIRABILIS    Procedures and diagnostic studies:  NM WBC SCAN TUMOR LOC LIMITED  Result Date: 12/19/2021 CLINICAL DATA:  LEFT upper extremity cellulitis, sepsis, has known AV graft LUE, question infected graft EXAM: NUCLEAR MEDICINE LEUKOCYTE SCAN TECHNIQUE: Following intravenous administration of radiolabeled white blood cells, images of the head, neck, trunk, and extremities were obtained on subsequent days. SPECT CT imaging was also performed. Fused SPECT CT data sets are reviewed in 3 planes. RADIOPHARMACEUTICALS:  9.53 Tc-42m Ceretec labeled autologous leukocytes IV COMPARISON:  None Available. FINDINGS: Blood flow and blood pool images are normal. Delayed planar and SPECT CT images show no abnormal osseous or soft tissue localization of labeled leukocytes within the LEFT upper extremity or visualized thorax to suggest focal infection. Specifically, no abnormal tracer uptake is seen along the AV graft. Additional CT findings: Soft tissue edema and skin thickening LEFT upper extremity. Significant atelectasis in LEFT lower lobe especially at medial basilar portion. Subsegmental atelectasis RIGHT lower lobe, minimally RIGHT middle lobe. Small RIGHT pleural effusion. Scattered atherosclerotic calcifications aorta and upper abdominal visceral vessels. Coronary arterial calcifications. No thoracic adenopathy. IMPRESSION: Negative labeled leukocytes study. Specifically, no scintigraphic evidence of infected LEFT upper extremity dialysis graft. Bibasilar atelectasis, greater in LEFT lower lobe. Aortic Atherosclerosis (ICD10-I70.0). Electronically Signed   By: Lavonia Dana M.D.   On: 12/19/2021 12:33               LOS: 8 days   Katie [name]  [facility]   Pager on [email or web address]. If 7PM-7AM, please contact night-coverage at  [email or web address]     12/19/2021, 1:11 PM

## 2021-12-19 NOTE — Progress Notes (Signed)
Pre HD info 

## 2021-12-19 NOTE — Progress Notes (Signed)
Patient treatment initiated, will attempt a uf goal of 1577ml. Patient lines reversed due to high arterial pressures.

## 2021-12-19 NOTE — Progress Notes (Signed)
Pt catheter lines reversed d/t high AP. Gwyneth Revels, NP aware. No further alarms.

## 2021-12-19 NOTE — Progress Notes (Signed)
Pt UF back on and goal decreased to 1L per Gwyneth Revels, NP. Pt alert, no c/o, vss.

## 2021-12-19 NOTE — Progress Notes (Signed)
Patient states a slight ease in nausea, uf remains off at this time waiting for further improvement in blood pressure.

## 2021-12-19 NOTE — Progress Notes (Signed)
Pre HD RN assessment 

## 2021-12-19 NOTE — Progress Notes (Signed)
Post HD RN assessment 

## 2021-12-19 NOTE — Progress Notes (Signed)
Noted decrease in patient blood pressure, patient was states she feels fine will recheck.

## 2021-12-19 NOTE — Plan of Care (Signed)
  Problem: Education: Goal: Knowledge of General Education information will improve Description: Including pain rating scale, medication(s)/side effects and non-pharmacologic comfort measures Outcome: Progressing   Problem: Health Behavior/Discharge Planning: Goal: Ability to manage health-related needs will improve Outcome: Progressing   Problem: Clinical Measurements: Goal: Ability to maintain clinical measurements within normal limits will improve Outcome: Progressing Goal: Will remain free from infection Outcome: Progressing Goal: Diagnostic test results will improve Outcome: Progressing Goal: Respiratory complications will improve Outcome: Progressing Goal: Cardiovascular complication will be avoided Outcome: Progressing   Problem: Activity: Goal: Risk for activity intolerance will decrease Outcome: Progressing   Problem: Nutrition: Goal: Adequate nutrition will be maintained Outcome: Progressing   Problem: Elimination: Goal: Will not experience complications related to bowel motility Outcome: Progressing   Problem: Pain Managment: Goal: General experience of comfort will improve Outcome: Progressing   Problem: Safety: Goal: Ability to remain free from injury will improve Outcome: Progressing   Problem: Skin Integrity: Goal: Risk for impaired skin integrity will decrease Outcome: Progressing

## 2021-12-19 NOTE — Progress Notes (Signed)
Pt in HD unit for tx. Anxiety r/t HD tx. Pt requests "good stuff for tx". RN asked for clarification. Pt responded "Benadryl and Zofran." Pt requests 1L [place] for HD tx as well. Applied 02. Pre standing weight 73.5 kg. Will administer medications as needed.

## 2021-12-19 NOTE — Progress Notes (Signed)
Pt blood pressure did not improve, patient complaints of onset nausea uf turned off at this time. No extra saline given, Alerted patients rn Terence Lux and NP was made aware. Will monitor the patients for further changes in symptoms.

## 2021-12-19 NOTE — Progress Notes (Signed)
Pt 3.5 hr HD tx complete. 1L fluid removed, 84L BVP, post weight 71.9kg standing. Pt alert, no c/o, stable, report to primary RN. Safety maintained.

## 2021-12-20 DIAGNOSIS — L03114 Cellulitis of left upper limb: Secondary | ICD-10-CM | POA: Diagnosis not present

## 2021-12-20 NOTE — Progress Notes (Signed)
[facility]  ROUNDING NOTE   Subjective:   Katie [name] is a 66 year old female with past medical history of ESRD with hemodialysis TTS, diabetes mellitus, and hypertension. She presents to ED with worsening left lower arm swelling and redness. Patient has been admitted for ESRD (end stage renal disease) on dialysis ([place]) [N18.6, Z99.2] Cellulitis of left upper extremity [L03.114] Left arm cellulitis [Y70.623]  Patient is known to our practice and receives outpatient dialysis treatments at [facility] on a TTS schedule, supervised by Dr. Holley Raring.  Patient missed treatment on Saturday 12/09/2021 due to transportation issues.    Update: Patient resting quietly Continues to have pain and discomfort from left arm Tolerating meals Denies shortness of breath   Objective:  Vital signs in last 24 hours:  Temp:  [98.2 F (36.8 C)-98.8 F (37.1 C)] 98.3 F (36.8 C) (08/09 1145) Pulse Rate:  [71-77] 71 (08/09 1145) Resp:  [16-20] 17 (08/09 1145) BP: (120-161)/(56-108) 121/108 (08/09 1145) SpO2:  [95 %-98 %] 98 % (08/09 1145) Weight:  [76.7 kg] 76.7 kg (08/09 0500)  Weight change:  Filed Weights   12/19/21 0745 12/19/21 1142 12/20/21 0500  Weight: 73.5 kg 71.9 kg 76.7 kg    Intake/Output: I/O last 3 completed shifts: In: 170 [P.O.:120; IV Piggyback:50] Out: 1000 [Other:1000]   Intake/Output this shift:  Total I/O In: 360 [P.O.:360] Out: -   Physical Exam: General: NAD  Head: Normocephalic, atraumatic. Moist oral mucosal membranes  Eyes: Anicteric  Lungs:  Clear to auscultation, normal effort, room air  Heart: Regular rate and rhythm  Abdomen:  Soft, nontender  Extremities: No lower extremity peripheral edema.  Left upper extremity: 3+ edema, warm and erythematous.   Neurologic: Nonfocal, moving all four extremities  Skin: No lesions, left upper extremity erythema  Access: Right chest PermCath    Basic Metabolic Panel: Recent Labs  Lab 12/15/21 0931  12/19/21 0800  NA 138 138  K 3.9 4.2  CL 102 103  CO2 27 26  GLUCOSE 104* 107*  BUN 36* 42*  CREATININE 4.61* 5.78*  CALCIUM 8.3* 9.0  PHOS 4.7* 4.3     Liver Function Tests: Recent Labs  Lab 12/15/21 0931 12/19/21 0800  ALBUMIN 2.7* 2.6*    No results for input(s): "LIPASE", "AMYLASE" in the last 168 hours. No results for input(s): "AMMONIA" in the last 168 hours.  CBC: Recent Labs  Lab 12/19/21 0800  WBC 5.9  HGB 9.5*  HCT 31.4*  MCV 98.1  PLT 181     Cardiac Enzymes: No results for input(s): "CKTOTAL", "CKMB", "CKMBINDEX", "TROPONINI" in the last 168 hours.  BNP: Invalid input(s): "POCBNP"  CBG: No results for input(s): "GLUCAP" in the last 168 hours.   Microbiology: Results for orders placed or performed during the hospital encounter of 12/11/21  Blood Culture (routine x 2)     Status: None   Collection Time: 12/11/21 10:21 AM   Specimen: BLOOD  Result Value Ref Range Status   Specimen Description BLOOD LEFT [facility]  Final   Special Requests   Final    BOTTLES DRAWN AEROBIC AND ANAEROBIC Blood Culture adequate volume   Culture   Final    NO GROWTH 5 DAYS Performed at [facility], [address] [place], [place] [address]    Report Status 12/16/2021 FINAL  Final  Blood Culture (routine x 2)     Status: None   Collection Time: 12/11/21 10:21 AM   Specimen: BLOOD  Result Value Ref Range Status  Specimen Description BLOOD RIGHT ARM  Final   Special Requests   Final    BOTTLES DRAWN AEROBIC AND ANAEROBIC Blood Culture results may not be optimal due to an excessive volume of blood received in culture bottles   Culture   Final    NO GROWTH 5 DAYS Performed at [facility], [address] [place], [place] [address]    Report Status 12/16/2021 FINAL  Final  Urine Culture     Status: Abnormal   Collection Time: 12/11/21  5:36 PM   Specimen: Urine, Random  Result Value Ref Range Status   Specimen Description   Final    URINE,  RANDOM Performed at [facility], [address] [place], [place] [address]    Special Requests   Final    NONE Performed at [facility], [address] [place],  [address]    Culture >=100,000 COLONIES/mL PROTEUS MIRABILIS (A)  Final   Report Status 12/14/2021 FINAL  Final   Organism ID, Bacteria PROTEUS MIRABILIS (A)  Final      Susceptibility   Proteus mirabilis - MIC*    AMPICILLIN <=2 SENSITIVE Sensitive     CEFAZOLIN 8 SENSITIVE Sensitive     CEFEPIME <=0.12 SENSITIVE Sensitive     CEFTRIAXONE <=0.25 SENSITIVE Sensitive     CIPROFLOXACIN 1 RESISTANT Resistant     GENTAMICIN <=1 SENSITIVE Sensitive     IMIPENEM 1 SENSITIVE Sensitive     NITROFURANTOIN 128 RESISTANT Resistant     TRIMETH/SULFA <=20 SENSITIVE Sensitive     AMPICILLIN/SULBACTAM <=2 SENSITIVE Sensitive     PIP/TAZO <=4 SENSITIVE Sensitive     * >=100,000 COLONIES/mL PROTEUS MIRABILIS    Coagulation Studies: No results for input(s): "LABPROT", "INR" in the last 72 hours.   Urinalysis: No results for input(s): "COLORURINE", "LABSPEC", "PHURINE", "GLUCOSEU", "HGBUR", "BILIRUBINUR", "KETONESUR", "PROTEINUR", "UROBILINOGEN", "NITRITE", "LEUKOCYTESUR" in the last 72 hours.  Invalid input(s): "APPERANCEUR"     Imaging: NM WBC SCAN TUMOR LOC LIMITED  Result Date: 12/19/2021 CLINICAL DATA:  LEFT upper extremity cellulitis, sepsis, has known AV graft LUE, question infected graft EXAM: NUCLEAR MEDICINE LEUKOCYTE SCAN TECHNIQUE: Following intravenous administration of radiolabeled white blood cells, images of the head, neck, trunk, and extremities were obtained on subsequent days. SPECT CT imaging was also performed. Fused SPECT CT data sets are reviewed in 3 planes. RADIOPHARMACEUTICALS:  9.53 Tc-39m Ceretec labeled autologous leukocytes IV COMPARISON:  None Available. FINDINGS: Blood flow and blood pool images are normal. Delayed planar and SPECT CT images show no abnormal osseous or soft  tissue localization of labeled leukocytes within the LEFT upper extremity or visualized thorax to suggest focal infection. Specifically, no abnormal tracer uptake is seen along the AV graft. Additional CT findings: Soft tissue edema and skin thickening LEFT upper extremity. Significant atelectasis in LEFT lower lobe especially at medial basilar portion. Subsegmental atelectasis RIGHT lower lobe, minimally RIGHT middle lobe. Small RIGHT pleural effusion. Scattered atherosclerotic calcifications aorta and upper abdominal visceral vessels. Coronary arterial calcifications. No thoracic adenopathy. IMPRESSION: Negative labeled leukocytes study. Specifically, no scintigraphic evidence of infected LEFT upper extremity dialysis graft. Bibasilar atelectasis, greater in LEFT lower lobe. Aortic Atherosclerosis (ICD10-I70.0). Electronically Signed   By: Lavonia Dana M.D.   On: 12/19/2021 12:33     Medications:      carvedilol  6.25 mg Oral QPM   Chlorhexidine Gluconate Cloth  6 each Topical Q0600   citalopram  10 mg Oral Daily   furosemide  40 mg Oral  Daily   heparin  5,000 Units Subcutaneous Q8H   pantoprazole  40 mg Oral BID   traZODone  50 mg Oral QHS   acetaminophen, bisacodyl, diphenhydrAMINE, guaiFENesin-dextromethorphan, hydrALAZINE, ondansetron (ZOFRAN) IV, ondansetron (ZOFRAN) IV, oxyCODONE-acetaminophen, senna-docusate, tiZANidine, topiramate  Assessment/ Plan:  Katie [name] is a 66 y.o.  female   with past medical history of ESRD with hemodialysis TTS, diabetes mellitus, and hypertension. She presents to ED with worsening left lower arm swelling and redness. Patient has been admitted for ESRD (end stage renal disease) on dialysis ([place]) [N18.6, Z99.2] Cellulitis of left upper extremity [L03.114] Left arm cellulitis [W38.882]   [facility]/TTS/right PermCath/83.5 kg  End-stage renal disease on hemodialysis.  Will maintain outpatient schedule if possible.  Received dialysis  yesterday, UF 1L achieved. Next treatment scheduled for Thursday.  2. Anemia of chronic kidney disease Lab Results  Component Value Date   HGB 9.5 (L) 12/19/2021    Patient receives [place] outpatient.  May consider ESA next week with traetment  3. Secondary Hyperparathyroidism: PTH 497 on 12/07/21 Lab Results  Component Value Date   PTH 70 (H) 05/12/2020   CALCIUM 9.0 12/19/2021   PHOS 4.3 12/19/2021  Calcium and phosphorus within desired goal.  4.  Hypertension with chronic kidney disease.  Home regimen includes carvedilol and furosemide 40 mg daily.  Currently receiving these inpatient.  Blood pressure elevated 125/105   5.  Left arm cellulitis evidenced by edema, erythema, warm to touch.  Likely secondary to wound infection.  MRI with contrast completed on 12/12/2021 confirming cellulitis, no osteomyelitis noted.  WBC scan negative.  Infectious disease has stopped antibiotics due to negative infection work up. Vascular surgery will plan fistulagram to evaluate access this week.    LOS: 9 Jacarra Bobak 8/9/202312:59 PM

## 2021-12-20 NOTE — Progress Notes (Signed)
ID Pt is stable Still has left forearm swelling and heaviness  O/e awake and alert BP (!) 114/53 (BP Location: Right Arm)   Pulse 73   Temp 98.4 F (36.9 C)   Resp 17   Ht 5\' 6"  (1.676 m)   Wt 76.7 kg   SpO2 98%   BMI 27.29 kg/m   Left forearm remains swollen , and painful and tender  Swelling of lower third of upper arm and forearm on the left side persisit Erythema and tenderness     Labs    Latest Ref Rng & Units 12/19/2021    8:00 AM 12/11/2021   10:21 AM 11/25/2021    8:00 AM  CBC  WBC 4.0 - 10.5 K/uL 5.9  6.4  7.9   Hemoglobin 12.0 - 15.0 g/dL 9.5  10.0  10.2   Hematocrit 36.0 - 46.0 % 31.4  32.8  32.8   Platelets 150 - 400 K/uL 181  219  230        Latest Ref Rng & Units 12/19/2021    8:00 AM 12/15/2021    9:31 AM 12/12/2021    4:39 AM  CMP  Glucose 70 - 99 mg/dL 107  104  93   BUN 8 - 23 mg/dL 42  36  44   Creatinine 0.44 - 1.00 mg/dL 5.78  4.61  6.79   Sodium 135 - 145 mmol/L 138  138  137   Potassium 3.5 - 5.1 mmol/L 4.2  3.9  4.5   Chloride 98 - 111 mmol/L 103  102  103   CO2 22 - 32 mmol/L 26  27  24    Calcium 8.9 - 10.3 mg/dL 9.0  8.3  8.3     Micro 12/11/21 BC- Neg  UC- proteus  IMAGING RESULTS: MRI - of the left arm- no abscess I have personally reviewed the films  Impression/recommendation  Swelling left forearm and lower third of upper arm with pain and erythema-  Doppler did not show any thrombus MRI no abscess Tagged WBC scan- no uptake in the AVG looks like lymphedema/venous edema Awaiting fistulogram No response to antibiotics-  cefazolin,  DC on 12/19/21 She had a dog bite in May 2023 to the left finger and that was treated and completely resolved But now she has swelling  recurrent X 3 rd episode in 3 months, had been treated like cellulitis before  Keep the arm elevated   ESRD on dialysis Proteus in the urine-not sure whether this is just colonization- cefazolin treats this anyway   HTN on coreg   Anemia due to ESRD      Discussed the management with the patient,and care team

## 2021-12-20 NOTE — TOC Progression Note (Signed)
Transition of Care [facility]) - Progression Note    Patient Details  Name: Katie [name] MRN: [id number] Date of Birth: Feb 28, 1956  Transition of Care [facility]) CM/SW Contact  Laurena Slimmer, RN Phone Number: 12/20/2021, 12:14 PM  Clinical Narrative:    Attempt to reach [place] at [facility] to update of likely discharge date of 8/10 to begin preparation for onsite assessment to determine if patient can return. Unable to reach [place] at extension where call was transferred to. Unable to leave a message.         Expected Discharge Plan and Services                                                 Social Determinants of Health (SDOH) Interventions    Readmission Risk Interventions    09/27/2021    1:44 PM 09/26/2021   12:46 PM 03/08/2021    2:49 PM  Readmission Risk Prevention Plan  Transportation Screening  Complete Complete  Palliative Care Screening Not Applicable    Medication Review (RN Care Manager)  Complete Complete  HRI or Home Care Consult   Complete  SW Recovery Care/Counseling Consult  Complete   Palliative Care Screening  Not Applicable   [place]  Not Applicable Complete

## 2021-12-20 NOTE — Progress Notes (Signed)
Progress Note    Katie [name]  CWC:[id number] DOB: 12-04-1955  DOA: 12/11/2021 PCP: Merryl Hacker, No      Brief Narrative:    Medical records reviewed and are as summarized below:  Katie [name] is a 65 y.o. female with medical history significant for ESRD on hemodialysis, type II DM, hypertension, homelessness, sepsis from pneumonia in June 2022, left upper extremity cellulitis in June 2023, dog bite infection left upper extremity June 2023, chronic disease, aortic stenosis, fecal impaction, recent discharge from the hospital on 11/26/2021.  She has an old left arm fistula packed she has been getting hemodialysis through a right upper chest wall permacath.   She was found to have left upper extremity cellulitis. Treated with IV antibiotics for 7 days, but no significant improvement.  Ongoing evaluation including plan for fistulogram with vascular surgery this week.     Assessment/Plan:   Principal Problem:   Cellulitis of left upper extremity Active Problems:   ESRD (end stage renal disease) on dialysis ([place])   Hypertension   Anemia in ESRD (end-stage renal disease) (HCC)   Elevated lactic acid level   Chronic diastolic CHF (congestive heart failure) (HCC)   Depression, unspecified   Type II diabetes mellitus with renal manifestations (HCC)    Body mass index is 27.29 kg/m.  Recurrent left upper extremity cellulitis: Painful swelling of left forearm with erythema is not improving as expected after full course of IV antibiotics.   Tagged WBC scan of left arm was negative.   Stopped IV cefazolin per ID.   Lymphedema/venous edema, malfunctioning left AV fistula suspected because of poor response to antibiotics.   Vascular surgery consulted, plan for fistulogram (hopefully tomorrow 8/10). Of note, recently treated with 10-day course of doxycycline and Keflex (completed on 11/18/2021) for left upper extremity cellulitis.  No evidence of DVT or abscess in left upper extremity.    ESRD, anemia of chronic kidney disease, secondary hyperparathyroidism:  -- Nephrology following -- Continue dialysis TTS -- Monitor CBC  Constipation: Continue laxatives as needed  Chronic diastolic CHF: Compensated  Lactic acidosis: Resolved  Other comorbidities include depression, hypertension,   Consultants: Nephrologist, ID specialist, vascular surgeon  Procedures: Dialysis    Family Communication/Anticipated D/C date and plan/Code Status   DVT prophylaxis: heparin injection 5,000 Units Start: 12/11/21 1400     Code Status: Full Code  Family Communication: None Disposition Plan: Plan to discharge home in 2 to 3 days   Status is: Inpatient Remains inpatient appropriate because: IV antibiotics for left upper extremity cellulitis.  Vascular surgery planning fistulogram      Subjective:   Patient awake sitting up in bed when seen on rounds.  She reports fairly adequate pain control.  Reports she slept soundly for 5 hours after getting IV Benadryl and Zofran yesterday.  Otherwise denies acute complaints at this time  Objective:    Vitals:   12/20/21 0500 12/20/21 0746 12/20/21 1145 12/20/21 1541  BP:  (!) 125/105 (!) 121/108 (!) 114/53  Pulse:  73 71 73  Resp:  17 17 17   Temp:  98.6 F (37 C) 98.3 F (36.8 C) 98.4 F (36.9 C)  TempSrc:      SpO2:  98% 98% 98%  Weight: 76.7 kg     Height:         Filed Weights   12/19/21 0745 12/19/21 1142 12/20/21 0500  Weight: 73.5 kg 71.9 kg 76.7 kg    Exam: General exam: awake, alert, no acute distress,  obese HEENT:, Pinpoint pupils, moist mucus membranes, hearing grossly normal  Respiratory system: On room air, normal respiratory effort. Cardiovascular system: Regular rate and rhythm, intact pedal pulses.   Gastrointestinal system: Soft nontender nondistended Central nervous system: A&O x3. no gross focal neurologic deficits, normal speech Extremities: Left upper extremity fistula with palpable thrill,  left upper extremity is edematous to the forearm, indurated with erythema and warmth on palpation Skin: dry, intact, normal temperature, erythematous left upper extremity as above Psychiatry: normal mood, congruent affect, excessively talkative and tangential         Data Reviewed:   Notable labs (from 8/8, no new labs today) -  glucose 107, BUN 42, creatinine 5.78, albumin 2.6, GFR 8, hemoglobin 9.5, nonreactive hep B core IgM Ab, nonreactive hep B core total Ab.  Nuclear tagged WBC scan 8/7 negative      LOS: 9 days   Ezekiel Slocumb, DO  [facility]     12/20/2021, 6:26 PM

## 2021-12-20 NOTE — Plan of Care (Signed)
  Problem: Education: Goal: Knowledge of General Education information will improve Description: Including pain rating scale, medication(s)/side effects and non-pharmacologic comfort measures Outcome: Progressing   Problem: Health Behavior/Discharge Planning: Goal: Ability to manage health-related needs will improve Outcome: Progressing   Problem: Clinical Measurements: Goal: Ability to maintain clinical measurements within normal limits will improve Outcome: Progressing   Problem: Clinical Measurements: Goal: Will remain free from infection Outcome: Progressing   Problem: Clinical Measurements: Goal: Respiratory complications will improve Outcome: Progressing   Problem: Clinical Measurements: Goal: Cardiovascular complication will be avoided Outcome: Progressing   Problem: Activity: Goal: Risk for activity intolerance will decrease Outcome: Progressing   Problem: Nutrition: Goal: Adequate nutrition will be maintained Outcome: Progressing   Problem: Elimination: Goal: Will not experience complications related to bowel motility Outcome: Progressing   Problem: Pain Managment: Goal: General experience of comfort will improve Outcome: Progressing   Problem: Safety: Goal: Ability to remain free from injury will improve Outcome: Progressing   Problem: Skin Integrity: Goal: Risk for impaired skin integrity will decrease Outcome: Progressing

## 2021-12-20 NOTE — TOC Progression Note (Signed)
Transition of Care [facility]) - Progression Note    Patient Details  Name: Katie [name] MRN: [id number] Date of Birth: 1955/07/27  Transition of Care [facility]) CM/SW Contact  Laurena Slimmer, RN Phone Number: 12/20/2021, 2:43 PM  Clinical Narrative:    Spoke with Catalina Lunger, Memory Care Manager at [facility]. Advised of EDD for patient to return to facility.         Expected Discharge Plan and Services                                                 Social Determinants of Health (SDOH) Interventions    Readmission Risk Interventions    09/27/2021    1:44 PM 09/26/2021   12:46 PM 03/08/2021    2:49 PM  Readmission Risk Prevention Plan  Transportation Screening  Complete Complete  Palliative Care Screening Not Applicable    Medication Review (RN Care Manager)  Complete Complete  HRI or Home Care Consult   Complete  SW Recovery Care/Counseling Consult  Complete   Palliative Care Screening  Not Applicable   [place]  Not Applicable Complete

## 2021-12-21 ENCOUNTER — Encounter
Admission: EM | Disposition: A | Discharge status: Skilled Nursing Facility | Payer: Self-pay | Source: Skilled Nursing Facility | Attending: Internal Medicine

## 2021-12-21 ENCOUNTER — Other Ambulatory Visit: Payer: Self-pay | Admitting: Nephrology

## 2021-12-21 DIAGNOSIS — T82858A Stenosis of vascular prosthetic devices, implants and grafts, initial encounter: Secondary | ICD-10-CM | POA: Diagnosis not present

## 2021-12-21 DIAGNOSIS — N186 End stage renal disease: Secondary | ICD-10-CM | POA: Diagnosis not present

## 2021-12-21 DIAGNOSIS — L03114 Cellulitis of left upper limb: Secondary | ICD-10-CM | POA: Diagnosis not present

## 2021-12-21 DIAGNOSIS — D631 Anemia in chronic kidney disease: Secondary | ICD-10-CM | POA: Diagnosis not present

## 2021-12-21 HISTORY — PX: A/V SHUNT INTERVENTION: CATH118220

## 2021-12-21 SURGERY — A/V SHUNT INTERVENTION
Anesthesia: Moderate Sedation | Laterality: Left

## 2021-12-21 MED ORDER — VANCOMYCIN HCL IN DEXTROSE 1-5 GM/200ML-% IV SOLN
1000.0000 mg | INTRAVENOUS | Status: DC
  Filled 2021-12-21: qty 200

## 2021-12-21 MED ORDER — HEPARIN SODIUM (PORCINE) 1000 UNIT/ML IJ SOLN
INTRAMUSCULAR | Status: AC
  Filled 2021-12-21: qty 10

## 2021-12-21 MED ORDER — ONDANSETRON HCL 4 MG/2ML IJ SOLN
4.0000 mg | Freq: Four times a day (QID) | INTRAMUSCULAR | Status: DC | PRN
  Administered 2021-12-22 – 2021-12-24 (×3): 4 mg via INTRAVENOUS
  Filled 2021-12-21 (×4): qty 2

## 2021-12-21 MED ORDER — MIDAZOLAM HCL 2 MG/2ML IJ SOLN
INTRAMUSCULAR | Status: DC | PRN
  Administered 2021-12-21 (×2): 1 mg via INTRAVENOUS
  Administered 2021-12-21: .5 mg via INTRAVENOUS

## 2021-12-21 MED ORDER — ONDANSETRON HCL 4 MG/2ML IJ SOLN
INTRAMUSCULAR | Status: AC
  Filled 2021-12-21: qty 2

## 2021-12-21 MED ORDER — CEFAZOLIN SODIUM-DEXTROSE 1-4 GM/50ML-% IV SOLN
1.0000 g | Freq: Once | INTRAVENOUS | Status: DC
  Administered 2021-12-21: 1 g via INTRAVENOUS

## 2021-12-21 MED ORDER — MIDAZOLAM HCL 2 MG/ML PO SYRP: 8.0000 mg | ORAL_SOLUTION | Freq: Once | ORAL | Status: DC | PRN

## 2021-12-21 MED ORDER — HYDROMORPHONE HCL 1 MG/ML IJ SOLN
1.0000 mg | Freq: Once | INTRAMUSCULAR | Status: AC | PRN
  Administered 2021-12-21: 1 mg via INTRAVENOUS
  Filled 2021-12-21: qty 1

## 2021-12-21 MED ORDER — IODIXANOL 320 MG/ML IV SOLN
INTRAVENOUS | Status: DC | PRN
  Administered 2021-12-21: 15 mL via INTRAVENOUS

## 2021-12-21 MED ORDER — CEFAZOLIN SODIUM-DEXTROSE 1-4 GM/50ML-% IV SOLN
1.0000 g | Freq: Every day | INTRAVENOUS | Status: AC
  Administered 2021-12-22 – 2021-12-23 (×2): 1 g via INTRAVENOUS
  Filled 2021-12-21 (×2): qty 50

## 2021-12-21 MED ORDER — FENTANYL CITRATE (PF) 100 MCG/2ML IJ SOLN
INTRAMUSCULAR | Status: DC | PRN
  Administered 2021-12-21: 50 ug via INTRAVENOUS
  Administered 2021-12-21: 25 ug via INTRAVENOUS

## 2021-12-21 MED ORDER — CEFAZOLIN SODIUM-DEXTROSE 1-4 GM/50ML-% IV SOLN
INTRAVENOUS | Status: AC
  Filled 2021-12-21: qty 50

## 2021-12-21 MED ORDER — DIPHENHYDRAMINE HCL 50 MG/ML IJ SOLN: 50.0000 mg | Freq: Once | INTRAMUSCULAR | Status: DC | PRN

## 2021-12-21 MED ORDER — FAMOTIDINE 20 MG PO TABS: 40.0000 mg | ORAL_TABLET | Freq: Once | ORAL | Status: DC | PRN

## 2021-12-21 MED ORDER — MIDAZOLAM HCL 2 MG/2ML IJ SOLN
INTRAMUSCULAR | Status: AC
  Filled 2021-12-21: qty 4

## 2021-12-21 MED ORDER — FENTANYL CITRATE PF 50 MCG/ML IJ SOSY
PREFILLED_SYRINGE | INTRAMUSCULAR | Status: AC
  Filled 2021-12-21: qty 2

## 2021-12-21 MED ORDER — SODIUM CHLORIDE 0.9 % IV SOLN: INTRAVENOUS | Status: DC

## 2021-12-21 MED ORDER — HEPARIN SODIUM (PORCINE) 1000 UNIT/ML IJ SOLN
INTRAMUSCULAR | Status: DC | PRN
  Administered 2021-12-21: 3000 [IU] via INTRAVENOUS

## 2021-12-21 MED ORDER — METHYLPREDNISOLONE SODIUM SUCC 125 MG IJ SOLR: 125.0000 mg | Freq: Once | INTRAMUSCULAR | Status: DC | PRN

## 2021-12-21 MED ORDER — CEFAZOLIN SODIUM-DEXTROSE 1-4 GM/50ML-% IV SOLN: 1.0000 g | Freq: Every day | INTRAVENOUS | Status: DC

## 2021-12-21 SURGICAL SUPPLY — 11 items
BALLN LUTONIX AV 7X60X75 (BALLOONS) ×2
BALLOON LUTONIX AV 7X60X75 (BALLOONS) IMPLANT
CANNULA 5F STIFF (CANNULA) ×1 IMPLANT
COVER PROBE U/S 5X48 (MISCELLANEOUS) ×1 IMPLANT
DRAPE BRACHIAL (DRAPES) ×1 IMPLANT
GLIDEWIRE ADV .035X180CM (WIRE) ×1 IMPLANT
KIT ENCORE 26 ADVANTAGE (KITS) ×1 IMPLANT
PACK ANGIOGRAPHY (CUSTOM PROCEDURE TRAY) ×1 IMPLANT
SHEATH BRITE TIP 6FRX5.5 (SHEATH) ×1 IMPLANT
SUT MNCRL AB 4-0 PS2 18 (SUTURE) ×1 IMPLANT
TOWEL OR 17X26 4PK STRL BLUE (TOWEL DISPOSABLE) ×1 IMPLANT

## 2021-12-21 NOTE — Progress Notes (Signed)
UF on. VSS, pt alert, no c/o.

## 2021-12-21 NOTE — Progress Notes (Signed)
Progress Note    Katie [name]  JIR:[id number] DOB: 12-10-1955  DOA: 12/11/2021 PCP: Merryl Hacker, No      Brief Narrative:    Medical records reviewed and are as summarized below:  Katie [name] is a 66 y.o. female with medical history significant for ESRD on hemodialysis, type II DM, hypertension, homelessness, sepsis from pneumonia in June 2022, left upper extremity cellulitis in June 2023, dog bite infection left upper extremity June 2023, chronic disease, aortic stenosis, fecal impaction, recent discharge from the hospital on 11/26/2021.  She has an old left arm fistula packed she has been getting hemodialysis through a right upper chest wall permacath.   She was found to have left upper extremity cellulitis. Treated with IV antibiotics for 7 days, but no significant improvement.  Ongoing evaluation including plan for fistulogram with vascular surgery.     Assessment/Plan:   Principal Problem:   Cellulitis of left upper extremity Active Problems:   ESRD (end stage renal disease) on dialysis ([place])   Hypertension   Anemia in ESRD (end-stage renal disease) (HCC)   Elevated lactic acid level   Chronic diastolic CHF (congestive heart failure) (HCC)   Depression, unspecified   Type II diabetes mellitus with renal manifestations (HCC)    Body mass index is 25.34 kg/m.  Recurrent left upper extremity cellulitis:  Painful swelling of left forearm with erythema did not improving as expected after full course of IV antibiotics.   Tagged WBC scan of left arm was negative.   Stopped IV cefazolin per ID.   Lymphedema/venous edema, malfunctioning left AV fistula suspected because of poor response to antibiotics.   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  The graft was otherwise patent and the remainder of the central venous circulation was widely patent.  Of  note, recently treated with 10-day course of doxycycline and Keflex (completed on 11/18/2021) for left upper extremity cellulitis.  No evidence of DVT or abscess in left upper extremity.   ESRD, anemia of chronic kidney disease, secondary hyperparathyroidism:  -- Nephrology following -- Continue dialysis TTS -- Monitor CBC  Constipation: Continue laxatives as needed  Chronic diastolic CHF: Compensated  Lactic acidosis: Resolved  Other comorbidities include  Depression Hypertension   Consultants: Nephrologist, ID specialist, vascular surgeon  Procedures: Dialysis Left arm shuntogram    Family Communication/Anticipated D/C date and plan/Code Status   DVT prophylaxis: heparin injection 5,000 Units Start: 12/11/21 1400     Code Status: Full Code  Family Communication: None Disposition Plan: return to ?ALF   Status is: Inpatient Remains inpatient appropriate because: IV antibiotics for left upper extremity cellulitis.  Vascular surgery planning fistulogram      Subjective:   Patient seen in dialysis today.  No acute complaints or acute events reported.  She is hungry, currently NPO for vascular procedure earlier.  Objective:    Vitals:   12/21/21 1238 12/21/21 1245 12/21/21 1303 12/21/21 1317  BP:  (!) 105/47 120/65 (!) 142/62  Pulse: (!) 0 69 68 77  Resp:  11 11 16   Temp:      TempSrc:      SpO2:  91% 92% 93%  Weight:      Height:         Filed Weights   12/20/21 0500 12/21/21 0751 12/21/21 1120  Weight: 76.7 kg 75.1 kg 71.2 kg    Exam: General  exam: awake, getting dialysis, no acute distress, obese HEENT: moist mucus membranes, hearing grossly normal  Respiratory system: On room air, normal respiratory effort. Cardiovascular system: Regular rate and rhythm, intact pedal pulses.  R IJ  dialysis catheter in place. Gastrointestinal system: Soft nontender nondistended Central nervous system: A&O x3. no gross focal neurologic deficits, normal  speech Extremities: Left upper extremity fistula with palpable thrill, left upper extremity is edematous to the forearm, indurated with erythema and warmth on palpation Skin: dry, intact, warm & erythematous LUE as above Psychiatry: normal mood, congruent affect, excessively talkative and tangential         Data Reviewed:   No new labs today.      LOS: 10 days   Ezekiel Slocumb, DO  [facility]     12/21/2021, 5:02 PM

## 2021-12-21 NOTE — Progress Notes (Signed)
Post HD vitals 

## 2021-12-21 NOTE — Progress Notes (Signed)
UF off d/t sbp<80, pt resting, no c/o, 155ml NS flush given, [place], NP notified.

## 2021-12-21 NOTE — Progress Notes (Signed)
Pre HD RN assessment 

## 2021-12-21 NOTE — Progress Notes (Signed)
Post HD RN assessment 

## 2021-12-21 NOTE — Op Note (Signed)
[facility]    OPERATIVE NOTE   PROCEDURE: 1.  Left brachial artery to axillary vein arteriovenous graft cannulation under ultrasound guidance 2.  Left arm shuntogram 3.  Percutaneous transluminal angioplasty of left axillary vein and venous anastomotic stenosis with 7 mm diameter Lutonix drug-coated angioplasty balloon  PRE-OPERATIVE DIAGNOSIS: 1. ESRD 2. Malfunctioning left brachial artery to axillary vein arteriovenous graft with significant arm swelling and recurrent cellulitis  POST-OPERATIVE DIAGNOSIS: same as above   SURGEON: Leotis Pain, MD  ANESTHESIA: local with MCS  ESTIMATED BLOOD LOSS: 5 cc  FINDING(S): 80 to 90% stenosis of the venous anastomosis of the graft to the axillary vein and the axillary vein.  The graft was otherwise patent and the remainder of the central venous circulation was widely patent.  SPECIMEN(S):  None  CONTRAST: 15 cc  FLUORO TIME: 0.6 minutes  MODERATE CONSCIOUS SEDATION TIME:  Approximately 19 minutes using 2.5 mg of Versed and 75 mcg of Fentanyl  INDICATIONS: Katie [name] is a 66 y.o. female who presents with malfunctioning left brachial artery to axillary vein arteriovenous graft.  She has had a swollen left arm with requiring lower arm cellulitis far away from the graft with a negative infectious workup of the graft.  The patient is scheduled for left arm shuntogram.  The patient is aware the risks include but are not limited to: bleeding, infection, thrombosis of the cannulated access, and possible anaphylactic reaction to the contrast.  The patient is aware of the risks of the procedure and elects to proceed forward.  DESCRIPTION: After full informed written consent was obtained, the patient was brought back to the angiography suite and placed supine upon the angiography table.  The patient was connected to monitoring equipment. Moderate conscious sedation was administered during a face to face encounter throughout  the procedure with my supervision of the RN administering medicines and monitoring the patient's vital signs, pulse oximetry, telemetry and mental status throughout from the start of the procedure until the patient was taken to the recovery room The left arm was prepped and draped in the standard fashion for a percutaneous access intervention.  Under ultrasound guidance, the left brachial artery to axillary vein arteriovenous graft was cannulated with a micropuncture needle under direct ultrasound guidance were it was patent and a permanent image was performed.  The microwire was advanced into the graft and the needle was exchanged for the a microsheath.  I then upsized to a 6 Fr Sheath and imaging was performed.  Hand injections were completed to image the access including the central venous system. This demonstrated 80 to 90% stenosis of the venous anastomosis of the graft to the axillary vein and the axillary vein.  The graft was otherwise patent and the remainder of the central venous circulation was widely patent.  Based on the images, this patient will need intervention to this venous anastomotic stenosis. I then gave the patient 3000 units of intravenous heparin.  I then crossed the stenosis with an advantage wire.  Based on the imaging, a 7 mm x 6 cm Lutonix drug-coated angioplasty balloon was selected.  The balloon was centered around the stenosis and inflated to 10 ATM for 1 minute(s).  On completion imaging, a 15-20% residual stenosis was present.     Based on the completion imaging, no further intervention is necessary.  The wire and balloon were removed from the sheath.  A 4-0 Monocryl purse-string suture was sewn around the sheath.  The sheath was removed  while tying down the suture.  A sterile bandage was applied to the puncture site.  COMPLICATIONS: None  CONDITION: Stable   Leotis Pain  12/21/2021 12:45 PM    This note was created with Dragon Medical transcription system. Any errors  in dictation are purely unintentional.

## 2021-12-21 NOTE — Interval H&P Note (Signed)
History and Physical Interval Note:  12/21/2021 12:17 PM  Katie [name]  has presented today for surgery, with the diagnosis of ESRD.  The various methods of treatment have been discussed with the patient and family. After consideration of risks, benefits and other options for treatment, the patient has consented to  Procedure(s): A/V SHUNT INTERVENTION (Left) as a surgical intervention.  The patient's history has been reviewed, patient examined, no change in status, stable for surgery.  I have reviewed the patient's chart and labs.  Questions were answered to the patient's satisfaction.     Leotis Pain

## 2021-12-21 NOTE — Progress Notes (Signed)
[facility]  Daily Progress Note   Subjective  -   Patient slightly irritated about being n.p.o.  Continues to have left upper extremity swelling.  Tagged WBC scan showed no evidence of infection in the left upper extremity graft.  It is also noted that they do not believe the cause of swelling and discomfort.  Objective Vitals:   12/21/21 1020 12/21/21 1030 12/21/21 1045 12/21/21 1100  BP: 116/65 123/65  118/71  Pulse:  66    Resp:  12    Temp:      TempSrc:      SpO2: 100% 100% 100% 100%  Weight:      Height:        Intake/Output Summary (Last 24 hours) at 12/21/2021 1103 Last data filed at 12/20/2021 1846 Gross per 24 hour  Intake 480 ml  Output --  Net 480 ml    PULM  CTAB CV  RRR VASC  swollen left upper extremity.  Palpable thrill  Laboratory CBC    Component Value Date/Time   WBC 5.9 12/19/2021 0800   HGB 9.5 (L) 12/19/2021 0800   HCT 31.4 (L) 12/19/2021 0800   PLT 181 12/19/2021 0800    BMET    Component Value Date/Time   NA 138 12/19/2021 0800   K 4.2 12/19/2021 0800   CL 103 12/19/2021 0800   CO2 26 12/19/2021 0800   GLUCOSE 107 (H) 12/19/2021 0800   BUN 42 (H) 12/19/2021 0800   CREATININE 5.78 (H) 12/19/2021 0800   CALCIUM 9.0 12/19/2021 0800   GFRNONAA 8 (L) 12/19/2021 0800   GFRAA  01/19/2021 0833    SPECIMEN HEMOLYZED. HEMOLYSIS MAY AFFECT INTEGRITY OF RESULTS.    Assessment/Planning: The tagged WBC scan was negative for source of location in her graft.  There is also the suspicion that her swelling may come from a central venous stenosis.  We will have patient undergo a fistulogram for evaluation as a possible cause of continued left upper extremity swelling.  I have discussed the procedure with the patient.  We also discussed the risk, benefits and alternatives.  Patient is agreeable to proceed.  Kris Hartmann  12/21/2021, 11:03 AM

## 2021-12-21 NOTE — Progress Notes (Signed)
Pt requests 2L [place] w/ HD tx. Provided. Gwyneth Revels, NP notified.

## 2021-12-21 NOTE — Progress Notes (Signed)
[facility]  ROUNDING NOTE   Subjective:   Katie [name] is a 66 year old female with past medical history of ESRD with hemodialysis TTS, diabetes mellitus, and hypertension. She presents to ED with worsening left lower arm swelling and redness. Patient has been admitted for ESRD (end stage renal disease) on dialysis ([place]) [N18.6, Z99.2] Cellulitis of left upper extremity [L03.114] Left arm cellulitis [T90.300]  Patient is known to our practice and receives outpatient dialysis treatments at [facility] on a TTS schedule, supervised by Dr. Holley Raring.  Patient missed treatment on Saturday 12/09/2021 due to transportation issues.    Update: Patient seen and evaluated during dialysis   HEMODIALYSIS FLOWSHEET:  Blood Flow Rate (mL/min): 400 mL/min Arterial Pressure (mmHg): -170 mmHg Venous Pressure (mmHg): 160 mmHg TMP (mmHg): 2 mmHg Ultrafiltration Rate (mL/min): 921 mL/min Dialysate Flow Rate (mL/min): 300 ml/min Dialysis Fluid Bolus: Normal Saline Bolus Amount (mL): 100 mL  No complaints at this time   Objective:  Vital signs in last 24 hours:  Temp:  [97.9 F (36.6 C)-98.8 F (37.1 C)] 97.9 F (36.6 C) (08/10 1156) Pulse Rate:  [0-82] 0 (08/10 1238) Resp:  [9-22] 9 (08/10 1233) BP: (79-168)/(46-72) 135/58 (08/10 1233) SpO2:  [93 %-100 %] 100 % (08/10 1233) Weight:  [71.2 kg-75.1 kg] 71.2 kg (08/10 1120)  Weight change:  Filed Weights   12/20/21 0500 12/21/21 0751 12/21/21 1120  Weight: 76.7 kg 75.1 kg 71.2 kg    Intake/Output: I/O last 3 completed shifts: In: 840 [P.O.:840] Out: -    Intake/Output this shift:  Total I/O In: -  Out: 1000 [Other:1000]  Physical Exam: General: NAD  Head: Normocephalic, atraumatic. Moist oral mucosal membranes  Eyes: Anicteric  Lungs:  Clear to auscultation, normal effort, room air  Heart: Regular rate and rhythm  Abdomen:  Soft, nontender  Extremities: No lower extremity peripheral edema.  Left upper  extremity: 3+ edema, warm and erythematous.   Neurologic: Nonfocal, moving all four extremities  Skin: No lesions, left upper extremity erythema  Access: Right chest PermCath    Basic Metabolic Panel: Recent Labs  Lab 12/15/21 0931 12/19/21 0800  NA 138 138  K 3.9 4.2  CL 102 103  CO2 27 26  GLUCOSE 104* 107*  BUN 36* 42*  CREATININE 4.61* 5.78*  CALCIUM 8.3* 9.0  PHOS 4.7* 4.3     Liver Function Tests: Recent Labs  Lab 12/15/21 0931 12/19/21 0800  ALBUMIN 2.7* 2.6*    No results for input(s): "LIPASE", "AMYLASE" in the last 168 hours. No results for input(s): "AMMONIA" in the last 168 hours.  CBC: Recent Labs  Lab 12/19/21 0800  WBC 5.9  HGB 9.5*  HCT 31.4*  MCV 98.1  PLT 181     Cardiac Enzymes: No results for input(s): "CKTOTAL", "CKMB", "CKMBINDEX", "TROPONINI" in the last 168 hours.  BNP: Invalid input(s): "POCBNP"  CBG: No results for input(s): "GLUCAP" in the last 168 hours.   Microbiology: Results for orders placed or performed during the hospital encounter of 12/11/21  Blood Culture (routine x 2)     Status: None   Collection Time: 12/11/21 10:21 AM   Specimen: BLOOD  Result Value Ref Range Status   Specimen Description BLOOD LEFT [facility]  Final   Special Requests   Final    BOTTLES DRAWN AEROBIC AND ANAEROBIC Blood Culture adequate volume   Culture   Final    NO GROWTH 5 DAYS Performed at [facility], [address] [place], [place] [address]  Report Status 12/16/2021 FINAL  Final  Blood Culture (routine x 2)     Status: None   Collection Time: 12/11/21 10:21 AM   Specimen: BLOOD  Result Value Ref Range Status   Specimen Description BLOOD RIGHT ARM  Final   Special Requests   Final    BOTTLES DRAWN AEROBIC AND ANAEROBIC Blood Culture results may not be optimal due to an excessive volume of blood received in culture bottles   Culture   Final    NO GROWTH 5 DAYS Performed at [facility], [address]  [place], [place] [address]    Report Status 12/16/2021 FINAL  Final  Urine Culture     Status: Abnormal   Collection Time: 12/11/21  5:36 PM   Specimen: Urine, Random  Result Value Ref Range Status   Specimen Description   Final    URINE, RANDOM Performed at [facility], [address], [place], [place] [address]    Special Requests   Final    NONE Performed at [facility], [address] [place], [place] [address]    Culture >=100,000 COLONIES/mL PROTEUS MIRABILIS (A)  Final   Report Status 12/14/2021 FINAL  Final   Organism ID, Bacteria PROTEUS MIRABILIS (A)  Final      Susceptibility   Proteus mirabilis - MIC*    AMPICILLIN <=2 SENSITIVE Sensitive     CEFAZOLIN 8 SENSITIVE Sensitive     CEFEPIME <=0.12 SENSITIVE Sensitive     CEFTRIAXONE <=0.25 SENSITIVE Sensitive     CIPROFLOXACIN 1 RESISTANT Resistant     GENTAMICIN <=1 SENSITIVE Sensitive     IMIPENEM 1 SENSITIVE Sensitive     NITROFURANTOIN 128 RESISTANT Resistant     TRIMETH/SULFA <=20 SENSITIVE Sensitive     AMPICILLIN/SULBACTAM <=2 SENSITIVE Sensitive     PIP/TAZO <=4 SENSITIVE Sensitive     * >=100,000 COLONIES/mL PROTEUS MIRABILIS    Coagulation Studies: No results for input(s): "LABPROT", "INR" in the last 72 hours.   Urinalysis: No results for input(s): "COLORURINE", "LABSPEC", "PHURINE", "GLUCOSEU", "HGBUR", "BILIRUBINUR", "KETONESUR", "PROTEINUR", "UROBILINOGEN", "NITRITE", "LEUKOCYTESUR" in the last 72 hours.  Invalid input(s): "APPERANCEUR"     Imaging: PERIPHERAL VASCULAR CATHETERIZATION  Result Date: 12/21/2021 See surgical note for result.    Medications:      [MAR Hold] carvedilol  6.25 mg Oral QPM   [MAR Hold] Chlorhexidine Gluconate Cloth  6 each Topical Q0600   [MAR Hold] citalopram  10 mg Oral Daily   fentaNYL       [MAR Hold] furosemide  40 mg Oral Daily   [MAR Hold] heparin  5,000 Units Subcutaneous Q8H   heparin sodium (porcine)       heparin sodium (porcine)        midazolam       ondansetron       [MAR Hold] pantoprazole  40 mg Oral BID   [MAR Hold] traZODone  50 mg Oral QHS   [MAR Hold] acetaminophen, [MAR Hold] bisacodyl, [MAR Hold] diphenhydrAMINE, fentaNYL, [MAR Hold] guaiFENesin-dextromethorphan, heparin sodium (porcine), heparin sodium (porcine), [MAR Hold] hydrALAZINE, HYDROmorphone (DILAUDID) injection, midazolam, ondansetron, [MAR Hold] ondansetron (ZOFRAN) IV, [MAR Hold] ondansetron (ZOFRAN) IV, ondansetron (ZOFRAN) IV, [MAR Hold] oxyCODONE-acetaminophen, [MAR Hold] senna-docusate, [MAR Hold] tiZANidine, [MAR Hold] topiramate  Assessment/ Plan:  [name] is a 66 y.o.  female   with past medical history of ESRD with hemodialysis TTS, diabetes mellitus, and hypertension. She presents to ED with worsening left lower arm swelling and redness. Patient has  been admitted for ESRD (end stage renal disease) on dialysis ([place]) [N18.6, Z99.2] Cellulitis of left upper extremity [L03.114] Left arm cellulitis [J64.383]   [facility]/TTS/right PermCath/83.5 kg  End-stage renal disease on hemodialysis.  Will maintain outpatient schedule if possible.  Receiving dialysis with UF goal 1L as tolerated. Next treatment scheduled for Thursday.   2. Anemia of chronic kidney disease Lab Results  Component Value Date   HGB 9.5 (L) 12/19/2021    Patient receives [place] outpatient.  Hgb remains within acceptable range  3. Secondary Hyperparathyroidism: PTH 497 on 12/07/21 Lab Results  Component Value Date   PTH 70 (H) 05/12/2020   CALCIUM 9.0 12/19/2021   PHOS 4.3 12/19/2021  Calcium and phosphorus within acceptable range  4.  Hypertension with chronic kidney disease.  Home regimen includes carvedilol and furosemide 40 mg daily.  Currently receiving these inpatient.  Blood pressure 135/58 during dialysis   5.  Left arm cellulitis evidenced by edema, erythema, warm to touch.  Likely secondary to wound infection.  MRI with contrast  completed on 12/12/2021 confirming cellulitis, no osteomyelitis noted.  WBC scan negative.  Infectious disease has stopped antibiotics due to negative infection work up.   Vascular surgery to perform fistulagram today.    LOS: [place] 8/10/202312:46 PM

## 2021-12-21 NOTE — Progress Notes (Signed)
Pt HD start at 0750a. Pt agitated and irritable about NPO status. Pt given Zofran 4mg  IV w/ HD as ordered d/t nausea. Catheter lines reversed d/t high pressures. Safety maintained.

## 2021-12-21 NOTE — Progress Notes (Signed)
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- which was opened Pt says the left arm is already feeling less heavy and painful  O/e awake and alert BP (!) 142/62   Pulse 77   Temp 97.9 F (36.6 C) (Oral)   Resp 16   Ht 5\' 6"  (1.676 m)   Wt 71.2 kg   SpO2 93%   BMI 25.34 kg/m   Left forearm less swollen Bruising over upper arm       CNS non focal  Labs    Latest Ref Rng & Units 12/19/2021    8:00 AM 12/11/2021   10:21 AM 11/25/2021    8:00 AM  CBC  WBC 4.0 - 10.5 K/uL 5.9  6.4  7.9   Hemoglobin 12.0 - 15.0 g/dL 9.5  10.0  10.2   Hematocrit 36.0 - 46.0 % 31.4  32.8  32.8   Platelets 150 - 400 K/uL 181  219  230        Latest Ref Rng & Units 12/19/2021    8:00 AM 12/15/2021    9:31 AM 12/12/2021    4:39 AM  CMP  Glucose 70 - 99 mg/dL 107  104  93   BUN 8 - 23 mg/dL 42  36  44   Creatinine 0.44 - 1.00 mg/dL 5.78  4.61  6.79   Sodium 135 - 145 mmol/L 138  138  137   Potassium 3.5 - 5.1 mmol/L 4.2  3.9  4.5   Chloride 98 - 111 mmol/L 103  102  103   CO2 22 - 32 mmol/L 26  27  24    Calcium 8.9 - 10.3 mg/dL 9.0  8.3  8.3     Micro 12/11/21 BC- Neg  UC- proteus  IMAGING RESULTS: MRI - of the left arm- no abscess I have personally reviewed the films  Impression/recommendation  Swelling left forearm and lower third of upper arm with pain and erythema-  due to 80-90% stenosis of the venous anastomosis of the graft to the axillary vein and the axiallary vein S/p angioplasty  Doppler did not show any thrombus MRI no abscess Tagged WBC scan- no uptake in the AVG    No response to antibiotics-  cefazolin,  DC on 12/19/21 Got 1 dose today perioperatively- will do 2 more days because of the procedure and swelling of the upper arm   She had a dog bite in May 2023 to the left finger and that was treated and completely resolved But now she has swelling  recurrent X 3 rd episode in 3 months, had been treated like cellulitis before  Keep the  arm elevated   ESRD on dialysis Proteus in the urine-not sure whether this is just colonization- cefazolin treats this anyway   HTN on coreg   Anemia due to ESRD     Discussed the management with the patient,and care team  [facility] is covering Friday/Saturday and Sunday Available by phone for urgent issue

## 2021-12-21 NOTE — Progress Notes (Signed)
Pt HD complete. Start 0750 End 1120 3.5 hours UFR 1L 84L BVP 71.2kg post weight in bed  Pt alert, remains NPO, report to Special Procedures RN, pt to procedure post HD. Safety maintained.

## 2021-12-22 ENCOUNTER — Encounter: Payer: Self-pay | Admitting: Vascular Surgery

## 2021-12-22 DIAGNOSIS — L03114 Cellulitis of left upper limb: Secondary | ICD-10-CM | POA: Diagnosis not present

## 2021-12-22 LAB — CBC
HCT: 29.7 % — ABNORMAL LOW (ref 36.0–46.0)
Hemoglobin: 9.2 g/dL — ABNORMAL LOW (ref 12.0–15.0)
MCH: 30.2 pg (ref 26.0–34.0)
MCHC: 31 g/dL (ref 30.0–36.0)
MCV: 97.4 fL (ref 80.0–100.0)
Platelets: 188 10*3/uL (ref 150–400)
RBC: 3.05 MIL/uL — ABNORMAL LOW (ref 3.87–5.11)
RDW: 14.9 % (ref 11.5–15.5)
WBC: 6.4 10*3/uL (ref 4.0–10.5)
nRBC: 0 % (ref 0.0–0.2)

## 2021-12-22 LAB — RENAL FUNCTION PANEL
Albumin: 2.6 g/dL — ABNORMAL LOW (ref 3.5–5.0)
Anion gap: 5 (ref 5–15)
BUN: 26 mg/dL — ABNORMAL HIGH (ref 8–23)
CO2: 30 mmol/L (ref 22–32)
Calcium: 8.8 mg/dL — ABNORMAL LOW (ref 8.9–10.3)
Chloride: 101 mmol/L (ref 98–111)
Creatinine, Ser: 3.39 mg/dL — ABNORMAL HIGH (ref 0.44–1.00)
GFR, Estimated: 15 mL/min — ABNORMAL LOW (ref >60)
Glucose, Bld: 87 mg/dL (ref 70–99)
Phosphorus: 2.9 mg/dL (ref 2.5–4.6)
Potassium: 4.1 mmol/L (ref 3.5–5.1)
Sodium: 136 mmol/L (ref 135–145)

## 2021-12-22 NOTE — Plan of Care (Signed)
  Problem: Education: Goal: Knowledge of General Education information will improve Description: Including pain rating scale, medication(s)/side effects and non-pharmacologic comfort measures Outcome: Progressing   Problem: Health Behavior/Discharge Planning: Goal: Ability to manage health-related needs will improve Outcome: Progressing   Problem: Clinical Measurements: Goal: Ability to maintain clinical measurements within normal limits will improve Outcome: Progressing Goal: Will remain free from infection Outcome: Progressing Goal: Diagnostic test results will improve Outcome: Progressing Goal: Respiratory complications will improve Outcome: Progressing Goal: Cardiovascular complication will be avoided Outcome: Progressing   Problem: Activity: Goal: Risk for activity intolerance will decrease Outcome: Progressing   Problem: Nutrition: Goal: Adequate nutrition will be maintained Outcome: Progressing   Problem: Elimination: Goal: Will not experience complications related to bowel motility Outcome: Progressing   Problem: Pain Managment: Goal: General experience of comfort will improve Outcome: Progressing   Problem: Safety: Goal: Ability to remain free from injury will improve Outcome: Progressing   Problem: Skin Integrity: Goal: Risk for impaired skin integrity will decrease Outcome: Progressing

## 2021-12-22 NOTE — TOC Progression Note (Addendum)
Transition of Care [facility]) - Progression Note    Patient Details  Name: Katie [name] MRN: [id number] Date of Birth: 1955-05-24  [facility] of [facility] CM/SW Contact  Laurena Slimmer, RN Phone Number: 12/22/2021, 11:55 AM  Clinical Narrative:    Damaris Schooner with Warner Mccreedy at [facility] regarding return to facility. Advised of likely discharge 8/13. Anderson Malta stated if medications have not changed patient would be able to return 8/13. If medications have changed patient would be able to discharge 8/14. MD notified.         Expected Discharge Plan and Services                                                 Social Determinants of Health (SDOH) Interventions    Readmission Risk Interventions    09/27/2021    1:44 PM 09/26/2021   12:46 PM 03/08/2021    2:49 PM  Readmission Risk Prevention Plan  Transportation Screening  Complete Complete  Palliative Care Screening Not Applicable    Medication Review (RN Care Manager)  Complete Complete  HRI or Home Care Consult   Complete  SW Recovery Care/Counseling Consult  Complete   Palliative Care Screening  Not Applicable   [place]  Not Applicable Complete

## 2021-12-22 NOTE — Progress Notes (Signed)
Progress Note    Katie [name]  [id number]:[id number] DOB: Aug 20, 1955  DOA: 12/11/2021 PCP: Merryl Hacker, No      Brief Narrative:    Medical records reviewed and are as summarized below:  Katie [name] is a 66 y.o. female with medical history significant for ESRD on hemodialysis, type II DM, hypertension, homelessness, sepsis from pneumonia in June 2022, left upper extremity cellulitis in June 2023, dog bite infection left upper extremity June 2023, chronic disease, aortic stenosis, fecal impaction, recent discharge from the hospital on 11/26/2021.  She has an old left arm fistula packed she has been getting hemodialysis through a right upper chest wall permacath.   She was found to have left upper extremity cellulitis. Treated with IV antibiotics for 7 days, but no significant improvement.   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     Assessment/Plan:   Principal Problem:   Cellulitis of left upper extremity Active Problems:   ESRD (end stage renal disease) on dialysis ([place])   Hypertension   Anemia in ESRD (end-stage renal disease) (HCC)   Elevated lactic acid level   Chronic diastolic CHF (congestive heart failure) (HCC)   Depression, unspecified   Type II diabetes mellitus with renal manifestations (HCC)    Body mass index is 25.34 kg/m.  Recurrent left upper extremity cellulitis:  Painful swelling of left forearm with erythema did not improving as expected after full course of IV antibiotics.   Tagged WBC scan of left arm was negative.   Stopped IV cefazolin per ID.   Lymphedema/venous edema, malfunctioning left AV fistula suspected because of poor response to antibiotics.   --Vascular surgery consulted --Fistulogram 8/10 - findings 80 to 90% stenosis of the venous anastomosis of the graft to the axillary vein and the axillary vein.  The graft was otherwise patent and the remainder of the central venous  circulation was widely patent.  After angioplasty, 15-20% residual stenosis was seen. --8/11: Left arm swelling significantly improved, as has erythema and warmth, patient reports ongoing pain -- ID is following and recommended 2 more days IV antibiotic after procedure  Of note, recently treated with 10-day course of doxycycline and Keflex (completed on 11/18/2021) for left upper extremity cellulitis.  No evidence of DVT or abscess in left upper extremity.   ESRD, anemia of chronic kidney disease, secondary hyperparathyroidism:  -- Nephrology following -- Continue dialysis TTS -- Monitor CBC  Constipation: Continue laxatives as needed  Chronic diastolic CHF: Compensated  Lactic acidosis: Resolved  Other comorbidities include  Depression Hypertension   Consultants: Nephrologist, ID specialist, vascular surgeon  Procedures: Dialysis Left arm shuntogram    Family Communication/Anticipated D/C date and plan/Code Status   DVT prophylaxis: heparin injection 5,000 Units Start: 12/11/21 1400     Code Status: Full Code  Family Communication: None Disposition Plan: return to ?ALF   Status is: Inpatient Remains inpatient appropriate because: IV antibiotics recommended for 2 days post fistulogram yesterday.  Anticipate medically stable for discharge Sunday 8/13.      Subjective:   Patient awake sitting up in bed when seen on rounds today.  She reports that her arm continues to hurt but agrees that the swelling is significantly improved.  She reports severe constipation but did have BM after getting laxative.  Says she still feels backed up however.  Request pain medication.  Objective:    Vitals:   12/21/21 2337 12/22/21 0430 12/22/21 0827 12/22/21 1240  BP: 128/68 Marland Kitchen)  167/81 (!) 170/78 (!) 146/72  Pulse: 83 81 81 73  Resp: 16 17 16 16   Temp: 98.1 F (36.7 C) 98.2 F (36.8 C) 98.3 F (36.8 C) 98.1 F (36.7 C)  TempSrc:      SpO2: 94% 98% 98% 98%  Weight:       Height:         Filed Weights   12/20/21 0500 12/21/21 0751 12/21/21 1120  Weight: 76.7 kg 75.1 kg 71.2 kg    Exam: General exam: Awake alert no acute distress, obese HEENT: Pinpoint pupils, moist mucus membranes, hearing grossly normal  Respiratory system: CT DIB, on room air with normal respiratory effort. Cardiovascular system: RRR, no lower extremity edema.  R IJ  dialysis catheter in place. Central nervous system: A&O x3. no gross focal neurologic deficits, normal speech Extremities: Left upper extremity fistula with palpable thrill, left upper extremity with significantly improved swelling and erythema, no longer warm on palpation Psychiatry: normal mood, congruent affect, excessively talkative and tangential         Data Reviewed:   Labs notable for BUN 26, creatinine 3.39, calcium 8.8, albumin 2.6, GFR 15, hemoglobin 9.2 from 9.5 stable      LOS: 11 days   Ezekiel Slocumb, DO  [facility]     12/22/2021, 4:21 PM

## 2021-12-22 NOTE — Progress Notes (Signed)
Established hemodialysis patient known at [facility] TTS 6:45am. Patient stated no dialysis concerns.

## 2021-12-23 MED ORDER — HEPARIN SODIUM (PORCINE) 1000 UNIT/ML IJ SOLN
INTRAMUSCULAR | Status: AC
  Filled 2021-12-23: qty 1

## 2021-12-23 NOTE — Progress Notes (Signed)
Progress Note    Katie [name]  RSW:[id number] DOB: [birth year]  DOA: 12/11/2021 PCP: Merryl Hacker, No      Brief Narrative:    Medical records reviewed and are as summarized below:  Katie [name] is a 66 y.o. female with medical history significant for ESRD on hemodialysis, type II DM, hypertension, homelessness, sepsis from pneumonia in June 2022, left upper extremity cellulitis in June 2023, dog bite infection left upper extremity June 2023, chronic disease, aortic stenosis, fecal impaction, recent discharge from the hospital on 11/26/2021.  She has an old left arm fistula packed she has been getting hemodialysis through a right upper chest wall permacath.   She was found to have left upper extremity cellulitis. Treated with IV antibiotics for 7 days, but no significant improvement.   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     Assessment/Plan:   Principal Problem:   Cellulitis of left upper extremity Active Problems:   ESRD (end stage renal disease) on dialysis ([place])   Hypertension   Anemia in ESRD (end-stage renal disease) (HCC)   Elevated lactic acid level   Chronic diastolic CHF (congestive heart failure) (HCC)   Depression, unspecified   Type II diabetes mellitus with renal manifestations (HCC)    Body mass index is 26.08 kg/m.  Recurrent left upper extremity cellulitis:  Painful swelling of left forearm with erythema did not improving as expected after full course of IV antibiotics.   Tagged WBC scan of left arm was negative.   Stopped IV cefazolin per ID.   Lymphedema/venous edema, malfunctioning left AV fistula suspected because of poor response to antibiotics.   --Vascular surgery consulted --Fistulogram 8/10 - findings 80 to 90% stenosis of the venous anastomosis of the graft to the axillary vein and the axillary vein.  The graft was otherwise patent and the remainder of the central venous  circulation was widely patent.  After angioplasty, 15-20% residual stenosis was seen. --8/11: Left arm swelling significantly improved, as has erythema and warmth, patient reports ongoing pain -- ID is following and recommended 2 more days IV antibiotic after procedure  Of note, recently treated with 10-day course of doxycycline and Keflex (completed on 11/18/2021) for left upper extremity cellulitis.  No evidence of DVT or abscess in left upper extremity.   ESRD, anemia of chronic kidney disease, secondary hyperparathyroidism:  -- Nephrology following -- Continue dialysis TTS -- Monitor CBC  Constipation: Continue laxatives as needed  Chronic diastolic CHF: Compensated  Lactic acidosis: Resolved  Other comorbidities include  Depression Hypertension   Consultants: Nephrologist, ID specialist, vascular surgeon  Procedures: Dialysis Left arm shuntogram    Family Communication/Anticipated D/C date and plan/Code Status   DVT prophylaxis: heparin injection 5,000 Units Start: 12/11/21 1400     Code Status: Full Code  Family Communication: None Disposition Plan: return to ?ALF   Status is: Inpatient Remains inpatient appropriate because: IV antibiotics recommended for 2 days post fistulogram yesterday.  Anticipate medically stable for discharge Sunday 8/13.      Subjective:   Patient appears to be sleeping comfortably, in bed with blanket over her head.  I can see her move somewhat when I spoke but she did not remove the blanket or interact with me today.  No acute events reported.  Objective:    Vitals:   12/23/21 1354 12/23/21 1400 12/23/21 1430 12/23/21 1500  BP: (!) 178/76 (!) 182/78 (!) 175/75 (!) 169/77  Pulse: 77 77 78  79  Resp: 14 15 11 10   Temp:      TempSrc:      SpO2: 96% 95% 97% 96%  Weight:      Height:         Filed Weights   12/21/21 0751 12/21/21 1120 12/23/21 1210  Weight: 75.1 kg 71.2 kg 73.3 kg    Exam: General exam: Lying bed with  blanket overhead, appears sleeping comfortably, no acute distress, obese Respiratory system: Normal respiratory effort, on room air. Cardiovascular system: RRR, no lower extremity edema.  R IJ  dialysis catheter in place. Central nervous system: Limited but grossly nonfocal exam Extremities: Left upper extremity fistula in place, further improved swelling and resolved erythema Psychiatry: normal mood, congruent affect, excessively talkative and tangential         Data Reviewed:   No new labs today     LOS: 12 days   Ezekiel Slocumb, DO  [facility]     12/23/2021, 3:24 PM

## 2021-12-23 NOTE — Plan of Care (Signed)
  Problem: Education: Goal: Knowledge of General Education information will improve Description: Including pain rating scale, medication(s)/side effects and non-pharmacologic comfort measures Outcome: Progressing   Problem: Health Behavior/Discharge Planning: Goal: Ability to manage health-related needs will improve Outcome: Progressing   Problem: Clinical Measurements: Goal: Ability to maintain clinical measurements within normal limits will improve Outcome: Progressing Goal: Will remain free from infection Outcome: Progressing Goal: Diagnostic test results will improve Outcome: Progressing Goal: Respiratory complications will improve Outcome: Progressing Goal: Cardiovascular complication will be avoided Outcome: Progressing   Problem: Activity: Goal: Risk for activity intolerance will decrease Outcome: Progressing   Problem: Nutrition: Goal: Adequate nutrition will be maintained Outcome: Progressing   Problem: Elimination: Goal: Will not experience complications related to bowel motility Outcome: Progressing   Problem: Pain Managment: Goal: General experience of comfort will improve Outcome: Progressing   Problem: Safety: Goal: Ability to remain free from injury will improve Outcome: Progressing   Problem: Skin Integrity: Goal: Risk for impaired skin integrity will decrease Outcome: Progressing

## 2021-12-23 NOTE — Progress Notes (Addendum)
Received patient in bed to unit.  Alert and oriented.  Informed consent signed and in  chart.   Treatment initiated: 1254 Treatment completed: 1635  Patient tolerated well.  Transported back to the room  alert, without acute distress.  Hand-off given to patient's nurse.   Access used: Catheter and Graft Access issues: Arterial Port cracked, pulling air during aspiration; Dr. Lorenso Courier made aware.  Vascular gave permission to access the Graft.   Total UF removed: 1000cc Medication(s) given: none Post HD VS: 97.9, 199/78 (114), HR-84, RR21, SP02-98 Post HD weight: 73kg   [facility]

## 2021-12-23 NOTE — Progress Notes (Signed)
[facility]  Dialysis Note   Subjective:   Seen and examined on hemodialysis treatment. Catheter malfunction.  Was able to cannulate AV graft.   HEMODIALYSIS FLOWSHEET:  Blood Flow Rate (mL/min): 270 mL/min Arterial Pressure (mmHg): 210 mmHg Venous Pressure (mmHg): 100 mmHg TMP (mmHg): 17 mmHg Ultrafiltration Rate (mL/min): 543 mL/min Dialysate Flow Rate (mL/min): 300 ml/min Dialysis Fluid Bolus: Normal Saline Bolus Amount (mL): 300 mL    Objective:  Vital signs in last 24 hours:  Temp:  [98.1 F (36.7 C)-98.3 F (36.8 C)] 98.1 F (36.7 C) (08/12 1254) Pulse Rate:  [70-78] 78 (08/12 1430) Resp:  [11-20] 11 (08/12 1430) BP: (93-182)/(49-106) 175/75 (08/12 1430) SpO2:  [95 %-98 %] 97 % (08/12 1430) Weight:  [73.3 kg] 73.3 kg (08/12 1210)  Weight change:  Filed Weights   12/21/21 0751 12/21/21 1120 12/23/21 1210  Weight: 75.1 kg 71.2 kg 73.3 kg    Intake/Output: I/O last 3 completed shifts: In: 240 [P.O.:240] Out: 0    Intake/Output this shift:  Total I/O In: 120 [P.O.:120] Out: -   Physical Exam: General: NAD,   Head: Normocephalic, atraumatic. Moist oral mucosal membranes  Eyes: Anicteric, PERRL  Neck: Supple, trachea midline  Lungs:  Clear to auscultation  Heart: Regular rate and rhythm  Abdomen:  Soft, nontender,   Extremities:  peripheral edema.  Neurologic: Nonfocal, moving all four extremities  Skin: No lesions  Access:     Basic Metabolic Panel: Recent Labs  Lab 12/19/21 0800 12/22/21 0442  NA 138 136  K 4.2 4.1  CL 103 101  CO2 26 30  GLUCOSE 107* 87  BUN 42* 26*  CREATININE 5.78* 3.39*  CALCIUM 9.0 8.8*  PHOS 4.3 2.9    Liver Function Tests: Recent Labs  Lab 12/19/21 0800 12/22/21 0442  ALBUMIN 2.6* 2.6*   No results for input(s): "LIPASE", "AMYLASE" in the last 168 hours. No results for input(s): "AMMONIA" in the last 168 hours.  CBC: Recent Labs  Lab 12/19/21 0800 12/22/21 0442  WBC 5.9 6.4  HGB 9.5*  9.2*  HCT 31.4* 29.7*  MCV 98.1 97.4  PLT 181 188    Cardiac Enzymes: No results for input(s): "CKTOTAL", "CKMB", "CKMBINDEX", "TROPONINI" in the last 168 hours.  BNP: Invalid input(s): "POCBNP"  CBG: No results for input(s): "GLUCAP" in the last 168 hours.  Microbiology: Results for orders placed or performed during the hospital encounter of 12/11/21  Blood Culture (routine x 2)     Status: None   Collection Time: 12/11/21 10:21 AM   Specimen: BLOOD  Result Value Ref Range Status   Specimen Description BLOOD LEFT [facility]  Final   Special Requests   Final    BOTTLES DRAWN AEROBIC AND ANAEROBIC Blood Culture adequate volume   Culture   Final    NO GROWTH 5 DAYS Performed at [facility], [address] [place],  [address]    Report Status 12/16/2021 FINAL  Final  Blood Culture (routine x 2)     Status: None   Collection Time: 12/11/21 10:21 AM   Specimen: BLOOD  Result Value Ref Range Status   Specimen Description BLOOD RIGHT ARM  Final   Special Requests   Final    BOTTLES DRAWN AEROBIC AND ANAEROBIC Blood Culture results may not be optimal due to an excessive volume of blood received in culture bottles   Culture   Final    NO GROWTH 5 DAYS Performed at [facility], [address],  [place], [place] [address]    Report Status 12/16/2021 FINAL  Final  Urine Culture     Status: Abnormal   Collection Time: 12/11/21  5:36 PM   Specimen: Urine, Random  Result Value Ref Range Status   Specimen Description   Final    URINE, RANDOM Performed at [facility], [address], [place], [place] [address]    Special Requests   Final    NONE Performed at [facility], [place], [place] [address]    Culture >=100,000 COLONIES/mL PROTEUS MIRABILIS (A)  Final   Report Status 12/14/2021 FINAL  Final   Organism ID, Bacteria PROTEUS MIRABILIS (A)  Final      Susceptibility   Proteus mirabilis - MIC*    AMPICILLIN <=2  SENSITIVE Sensitive     CEFAZOLIN 8 SENSITIVE Sensitive     CEFEPIME <=0.12 SENSITIVE Sensitive     CEFTRIAXONE <=0.25 SENSITIVE Sensitive     CIPROFLOXACIN 1 RESISTANT Resistant     GENTAMICIN <=1 SENSITIVE Sensitive     IMIPENEM 1 SENSITIVE Sensitive     NITROFURANTOIN 128 RESISTANT Resistant     TRIMETH/SULFA <=20 SENSITIVE Sensitive     AMPICILLIN/SULBACTAM <=2 SENSITIVE Sensitive     PIP/TAZO <=4 SENSITIVE Sensitive     * >=100,000 COLONIES/mL PROTEUS MIRABILIS    Coagulation Studies: No results for input(s): "LABPROT", "INR" in the last 72 hours.  Urinalysis: No results for input(s): "COLORURINE", "LABSPEC", "PHURINE", "GLUCOSEU", "HGBUR", "BILIRUBINUR", "KETONESUR", "PROTEINUR", "UROBILINOGEN", "NITRITE", "LEUKOCYTESUR" in the last 72 hours.  Invalid input(s): "APPERANCEUR"    Imaging: No results found.   Medications:     ceFAZolin (ANCEF) IV 1 g (12/22/21 2131)    carvedilol  6.25 mg Oral QPM   Chlorhexidine Gluconate Cloth  6 each Topical Q0600   citalopram  10 mg Oral Daily   furosemide  40 mg Oral Daily   heparin  5,000 Units Subcutaneous Q8H   pantoprazole  40 mg Oral BID   traZODone  50 mg Oral QHS   acetaminophen, bisacodyl, diphenhydrAMINE, guaiFENesin-dextromethorphan, hydrALAZINE, ondansetron (ZOFRAN) IV, ondansetron (ZOFRAN) IV, ondansetron (ZOFRAN) IV, oxyCODONE-acetaminophen, senna-docusate, tiZANidine, topiramate  Assessment/ Plan:  [name] is a 66 y.o.  female   Principal Problem:   Cellulitis of left upper extremity Active Problems:   Hypertension   ESRD (end stage renal disease) on dialysis ([place])   Depression, unspecified   Type II diabetes mellitus with renal manifestations ([place])   Anemia in ESRD (end-stage renal disease) (HCC)   Elevated lactic acid level   Chronic diastolic CHF (congestive heart failure) ([place])     End Stage Renal Disease on hemodialysis:   Patient seen on dialysis.  Continue stable dialysis and attempt  fluid removal as tolerated.  Intake/Output Summary (Last 24 hours) at 12/23/2021 1442 Last data filed at 12/23/2021 1041 Gross per 24 hour  Intake 360 ml  Output 0 ml  Net 360 ml    2. Hypertension with chronic kidney disease: Continue carvedilol.   BP (!) 175/75   Pulse 78   Temp 98.1 F (36.7 C) (Oral)   Resp 11   Ht 5\' 6"  (1.676 m)   Wt 73.3 kg   SpO2 97%   BMI 26.08 kg/m   3. Anemia of chronic kidney disease/ kidney injury/chronic disease/acute blood loss:   Lab Results  Component Value Date   HGB 9.2 (L) 12/22/2021   Continue anemia protocol.  4. Secondary Hyperparathyroidism:     Lab Results  Component  Value Date   PTH 70 (H) 05/12/2020   CALCIUM 8.8 (L) 12/22/2021   PHOS 2.9 12/22/2021   We will continue to monitor PTH, calcium and phosphorus levels.  We will continue to monitor closely.   LOS: [place], [facility] 8/12/20232:42 PM

## 2021-12-24 NOTE — Progress Notes (Signed)
  [facility]  PROGRESS NOTE   Subjective:   Patient seen at bedside.  Events from dialysis yesterday noted. Catheter malfunction yesterday.  She was dialyzed via AV graft.  Objective:  Vital signs: Blood pressure (!) 151/61, pulse 75, temperature 98.2 F (36.8 C), resp. rate 16, height 5\' 6"  (1.676 m), weight 73 kg, SpO2 97 %.  Intake/Output Summary (Last 24 hours) at 12/24/2021 2033 Last data filed at 12/24/2021 1427 Gross per 24 hour  Intake 480 ml  Output 650 ml  Net -170 ml   Filed Weights   12/21/21 1120 12/23/21 1210 12/23/21 1645  Weight: 71.2 kg 73.3 kg 73 kg     Physical Exam: General:  No acute distress  Head:  Normocephalic, atraumatic. Moist oral mucosal membranes  Eyes:  Anicteric  Neck:  Supple  Lungs:   Clear to auscultation, normal effort  Heart:  S1S2 no rubs  Abdomen:   Soft, nontender, bowel sounds present  Extremities: 1+ peripheral edema.  Neurologic:  Awake, alert, following commands  Skin:  No lesions  Access:     Basic Metabolic Panel: Recent Labs  Lab 12/19/21 0800 12/22/21 0442  NA 138 136  K 4.2 4.1  CL 103 101  CO2 26 30  GLUCOSE 107* 87  BUN 42* 26*  CREATININE 5.78* 3.39*  CALCIUM 9.0 8.8*  PHOS 4.3 2.9    CBC: Recent Labs  Lab 12/19/21 0800 12/22/21 0442  WBC 5.9 6.4  HGB 9.5* 9.2*  HCT 31.4* 29.7*  MCV 98.1 97.4  PLT 181 188     Urinalysis: No results for input(s): "COLORURINE", "LABSPEC", "PHURINE", "GLUCOSEU", "HGBUR", "BILIRUBINUR", "KETONESUR", "PROTEINUR", "UROBILINOGEN", "NITRITE", "LEUKOCYTESUR" in the last 72 hours.  Invalid input(s): "APPERANCEUR"    Imaging: No results found.   Medications:     carvedilol  6.25 mg Oral QPM   Chlorhexidine Gluconate Cloth  6 each Topical Q0600   citalopram  10 mg Oral Daily   furosemide  40 mg Oral Daily   heparin  5,000 Units Subcutaneous Q8H   pantoprazole  40 mg Oral BID   traZODone  50 mg Oral QHS    Assessment/ Plan:     Principal  Problem:   Cellulitis of left upper extremity Active Problems:   Hypertension   ESRD (end stage renal disease) on dialysis (HCC)   Depression, unspecified   Type II diabetes mellitus with renal manifestations (HCC)   Anemia in ESRD (end-stage renal disease) (HCC)   Elevated lactic acid level   Chronic diastolic CHF (congestive heart failure) ([place])  66 year old female with past medical history of ESRD with hemodialysis TTS, diabetes mellitus, and hypertension. She presents to ED with worsening left lower arm swelling and redness. Patient has been admitted for ESRD (end stage renal disease) on dialysis ([place]) [N18.6, Z99.2] Cellulitis of left upper extremity [L03.114] Left arm cellulitis [F79.038]  #1: ESRD: Patient had stable dialysis via left AV graft yesterday.  Next dialysis is scheduled for Tuesday.  #2: Left arm cellulitis: Completed the course of antibiotics.  #3: Left AV graft: She had fistulogram and had an angioplasty done.  #4: Anemia: Continue with anemia protocol.    LOS: [place], [facility] 8/13/20238:33 PM

## 2021-12-24 NOTE — Progress Notes (Signed)
Progress Note    Katie [name]  WGY:[id number] DOB: August 20, 1955  DOA: 12/11/2021 PCP: Merryl Hacker, No      Brief Narrative:    Medical records reviewed and are as summarized below:  Katie [name] is a 66 y.o. female with medical history significant for ESRD on hemodialysis, type II DM, hypertension, homelessness, sepsis from pneumonia in June 2022, left upper extremity cellulitis in June 2023, dog bite infection left upper extremity June 2023, chronic disease, aortic stenosis, fecal impaction, recent discharge from the hospital on 11/26/2021.  She has an old left arm fistula packed she has been getting hemodialysis through a right upper chest wall permacath.   She was found to have left upper extremity cellulitis. Treated with IV antibiotics for 7 days, but no significant improvement.   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     Assessment/Plan:   Principal Problem:   Cellulitis of left upper extremity Active Problems:   ESRD (end stage renal disease) on dialysis ([place])   Hypertension   Anemia in ESRD (end-stage renal disease) (HCC)   Elevated lactic acid level   Chronic diastolic CHF (congestive heart failure) (HCC)   Depression, unspecified   Type II diabetes mellitus with renal manifestations (HCC)    Body mass index is 25.98 kg/m.  Recurrent left upper extremity cellulitis:  Painful swelling of left forearm with erythema did not improving as expected after full course of IV antibiotics.   Tagged WBC scan of left arm was negative.   Stopped IV cefazolin per ID.   Lymphedema/venous edema, malfunctioning left AV fistula suspected because of poor response to antibiotics.   --Vascular surgery consulted --Fistulogram 8/10 - findings 80 to 90% stenosis of the venous anastomosis of the graft to the axillary vein and the axillary vein.  The graft was otherwise patent and the remainder of the central venous  circulation was widely patent.  After angioplasty, 15-20% residual stenosis was seen. -- LUE swelling nearly resolved, no longer erythematous -- ID recommended 2 more days IV antibiotic after procedure, completed 8/12  Of note, recently treated with 10-day course of doxycycline and Keflex (completed on 11/18/2021) for left upper extremity cellulitis.  No evidence of DVT or abscess in left upper extremity.   ESRD, anemia of chronic kidney disease, secondary hyperparathyroidism:  -- Nephrology following -- Continue dialysis TTS -- Monitor CBC  Constipation: Continue laxatives as needed  Chronic diastolic CHF: Compensated  Lactic acidosis: Resolved  Other comorbidities include  Depression Hypertension   Consultants: Nephrologist, ID specialist, vascular surgeon  Procedures: Dialysis Left arm shuntogram    Family Communication/Anticipated D/C date and plan/Code Status   DVT prophylaxis: heparin injection 5,000 Units Start: 12/11/21 1400     Code Status: Full Code  Family Communication: None Disposition Plan: return to ALF 8/14  Medically stable for discharge.  Facility unable to accept during weekend.    Status is: Inpatient Remains inpatient appropriate because: Pt's facility unable to accept today due to new medication.      Subjective:  Pt watching TV sitting up in bed.  Reports she feels well and endorses swelling and redness nearly resolved.  Still reports pain at the wrist and forearm and wants to be sure pain meds available at time of d/c.  No other complaints or acute events reported.  Objective:    Vitals:   12/24/21 0137 12/24/21 0526 12/24/21 0747 12/24/21 1530  BP: (!) 145/64 139/60 (!) 184/78 (!) 128/55  Pulse: 72 76 76 76  Resp: 18 17  18   Temp: 98.5 F (36.9 C) 98.2 F (36.8 C) 98.3 F (36.8 C) 98 F (36.7 C)  TempSrc:    Oral  SpO2: 95% 97% 96% 99%  Weight:      Height:         Filed Weights   12/21/21 1120 12/23/21 1210 12/23/21  1645  Weight: 71.2 kg 73.3 kg 73 kg    Exam: General exam: watching TV in bed, awake, appears sleeping comfortably, no acute distress, obese Respiratory system: Normal respiratory effort, on room air. Cardiovascular system: RRR, no lower extremity edema.  R IJ  dialysis catheter in place. Central nervous system: Limited but grossly nonfocal exam Extremities: Left upper extremity fistula in place, significantly improved and nearly resolved swelling and resolved erythema of the LUE distal to the fistula Psychiatry: normal mood, congruent affect, pressured speech         Data Reviewed:   No new labs today     LOS: 13 days   Ezekiel Slocumb, DO  [facility]     12/24/2021, 5:30 PM

## 2021-12-25 MED ORDER — SENNOSIDES-DOCUSATE SODIUM 8.6-50 MG PO TABS: 1.0000 | ORAL_TABLET | Freq: Two times a day (BID) | ORAL | Status: DC | PRN

## 2021-12-25 MED ORDER — OXYCODONE-ACETAMINOPHEN 5-325 MG PO TABS: 1.0000 | ORAL_TABLET | Freq: Three times a day (TID) | ORAL | 0 refills | Status: DC | PRN

## 2021-12-25 NOTE — Progress Notes (Signed)
Blood pressure 133/69, pulse 70, temperature 98.1 F (36.7 C), resp. rate 16, height 5\' 6"  (1.676 m), weight 73 kg, SpO2 100 %. Iv removed site c/d/I, perm cath dressing to be changed 8/15, reported called to [facility], pt transported via EMS to facility and was placed in a transport gown all belongings sent with pt to facility.

## 2021-12-25 NOTE — Progress Notes (Signed)
  [facility]  PROGRESS NOTE   Subjective:   Patient seen laying in bed Alert and oriented Reports less pain from left arm  Objective:  Vital signs: Blood pressure 133/69, pulse 70, temperature 98.1 F (36.7 C), resp. rate 16, height 5\' 6"  (1.676 m), weight 73 kg, SpO2 100 %.  Intake/Output Summary (Last 24 hours) at 12/25/2021 1413 Last data filed at 12/24/2021 1427 Gross per 24 hour  Intake 240 ml  Output --  Net 240 ml    Filed Weights   12/21/21 1120 12/23/21 1210 12/23/21 1645  Weight: 71.2 kg 73.3 kg 73 kg     Physical Exam: General:  No acute distress  Head:  Normocephalic, atraumatic. Moist oral mucosal membranes  Eyes:  Anicteric  Lungs:   Clear to auscultation, normal effort  Heart:  S1S2 no rubs  Abdomen:   Soft, nontender, bowel sounds present  Extremities: 1+ peripheral edema.  Neurologic:  Awake, alert, following commands  Skin:  No lesions  Access: Lt AVF    Basic Metabolic Panel: Recent Labs  Lab 12/19/21 0800 12/22/21 0442  NA 138 136  K 4.2 4.1  CL 103 101  CO2 26 30  GLUCOSE 107* 87  BUN 42* 26*  CREATININE 5.78* 3.39*  CALCIUM 9.0 8.8*  PHOS 4.3 2.9     CBC: Recent Labs  Lab 12/19/21 0800 12/22/21 0442  WBC 5.9 6.4  HGB 9.5* 9.2*  HCT 31.4* 29.7*  MCV 98.1 97.4  PLT 181 188      Urinalysis: No results for input(s): "COLORURINE", "LABSPEC", "PHURINE", "GLUCOSEU", "HGBUR", "BILIRUBINUR", "KETONESUR", "PROTEINUR", "UROBILINOGEN", "NITRITE", "LEUKOCYTESUR" in the last 72 hours.  Invalid input(s): "APPERANCEUR"    Imaging: No results found.   Medications:     carvedilol  6.25 mg Oral QPM   Chlorhexidine Gluconate Cloth  6 each Topical Q0600   citalopram  10 mg Oral Daily   furosemide  40 mg Oral Daily   heparin  5,000 Units Subcutaneous Q8H   pantoprazole  40 mg Oral BID   traZODone  50 mg Oral QHS    Assessment/ Plan:     Principal Problem:   Cellulitis of left upper extremity Active  Problems:   Hypertension   ESRD (end stage renal disease) on dialysis (HCC)   Depression, unspecified   Type II diabetes mellitus with renal manifestations (HCC)   Anemia in ESRD (end-stage renal disease) (HCC)   Elevated lactic acid level   Chronic diastolic CHF (congestive heart failure) ([place])  66 year old female with past medical history of ESRD with hemodialysis TTS, diabetes mellitus, and hypertension. She presents to ED with worsening left lower arm swelling and redness. Patient has been admitted for ESRD (end stage renal disease) on dialysis ([place]) [N18.6, Z99.2] Cellulitis of left upper extremity [L03.114] Left arm cellulitis [M46.803]  #1: ESRD: Received dialysis using AVF Saturday, tolerated well. Next treatment scheduled for Tuesday.   #2: Left arm cellulitis: Antibiotics completed  #3: Left AV graft: She had fistulogram and had an angioplasty done.  #4: Anemia:Monitoring    LOS: [facility] 8/14/20232:13 PM

## 2021-12-25 NOTE — Plan of Care (Signed)
  Problem: Education: Goal: Knowledge of General Education information will improve Description: Including pain rating scale, medication(s)/side effects and non-pharmacologic comfort measures Outcome: Progressing   Problem: Health Behavior/Discharge Planning: Goal: Ability to manage health-related needs will improve Outcome: Progressing   Problem: Clinical Measurements: Goal: Ability to maintain clinical measurements within normal limits will improve Outcome: Progressing Goal: Will remain free from infection Outcome: Progressing Goal: Diagnostic test results will improve Outcome: Progressing Goal: Respiratory complications will improve Outcome: Progressing Goal: Cardiovascular complication will be avoided Outcome: Progressing   Problem: Activity: Goal: Risk for activity intolerance will decrease Outcome: Progressing   Problem: Nutrition: Goal: Adequate nutrition will be maintained Outcome: Progressing   Problem: Elimination: Goal: Will not experience complications related to bowel motility Outcome: Progressing   Problem: Pain Managment: Goal: General experience of comfort will improve Outcome: Progressing   Problem: Safety: Goal: Ability to remain free from injury will improve Outcome: Progressing   Problem: Skin Integrity: Goal: Risk for impaired skin integrity will decrease Outcome: Progressing

## 2021-12-25 NOTE — H&P (View-Only) (Signed)
  [facility]  PROGRESS NOTE   Subjective:   Patient seen laying in bed Alert and oriented Reports less pain from left arm  Objective:  Vital signs: Blood pressure 133/69, pulse 70, temperature 98.1 F (36.7 C), resp. rate 16, height 5\' 6"  (1.676 m), weight 73 kg, SpO2 100 %.  Intake/Output Summary (Last 24 hours) at 12/25/2021 1413 Last data filed at 12/24/2021 1427 Gross per 24 hour  Intake 240 ml  Output --  Net 240 ml    Filed Weights   12/21/21 1120 12/23/21 1210 12/23/21 1645  Weight: 71.2 kg 73.3 kg 73 kg     Physical Exam: General:  No acute distress  Head:  Normocephalic, atraumatic. Moist oral mucosal membranes  Eyes:  Anicteric  Lungs:   Clear to auscultation, normal effort  Heart:  S1S2 no rubs  Abdomen:   Soft, nontender, bowel sounds present  Extremities: 1+ peripheral edema.  Neurologic:  Awake, alert, following commands  Skin:  No lesions  Access: Lt AVF    Basic Metabolic Panel: Recent Labs  Lab 12/19/21 0800 12/22/21 0442  NA 138 136  K 4.2 4.1  CL 103 101  CO2 26 30  GLUCOSE 107* 87  BUN 42* 26*  CREATININE 5.78* 3.39*  CALCIUM 9.0 8.8*  PHOS 4.3 2.9     CBC: Recent Labs  Lab 12/19/21 0800 12/22/21 0442  WBC 5.9 6.4  HGB 9.5* 9.2*  HCT 31.4* 29.7*  MCV 98.1 97.4  PLT 181 188      Urinalysis: No results for input(s): "COLORURINE", "LABSPEC", "PHURINE", "GLUCOSEU", "HGBUR", "BILIRUBINUR", "KETONESUR", "PROTEINUR", "UROBILINOGEN", "NITRITE", "LEUKOCYTESUR" in the last 72 hours.  Invalid input(s): "APPERANCEUR"    Imaging: No results found.   Medications:     carvedilol  6.25 mg Oral QPM   Chlorhexidine Gluconate Cloth  6 each Topical Q0600   citalopram  10 mg Oral Daily   furosemide  40 mg Oral Daily   heparin  5,000 Units Subcutaneous Q8H   pantoprazole  40 mg Oral BID   traZODone  50 mg Oral QHS    Assessment/ Plan:     Principal Problem:   Cellulitis of left upper extremity Active  Problems:   Hypertension   ESRD (end stage renal disease) on dialysis (HCC)   Depression, unspecified   Type II diabetes mellitus with renal manifestations (HCC)   Anemia in ESRD (end-stage renal disease) (HCC)   Elevated lactic acid level   Chronic diastolic CHF (congestive heart failure) ([place])  66 year old female with past medical history of ESRD with hemodialysis TTS, diabetes mellitus, and hypertension. She presents to ED with worsening left lower arm swelling and redness. Patient has been admitted for ESRD (end stage renal disease) on dialysis ([place]) [N18.6, Z99.2] Cellulitis of left upper extremity [L03.114] Left arm cellulitis [R74.081]  #1: ESRD: Received dialysis using AVF Saturday, tolerated well. Next treatment scheduled for Tuesday.   #2: Left arm cellulitis: Antibiotics completed  #3: Left AV graft: She had fistulogram and had an angioplasty done.  #4: Anemia:Monitoring    LOS: [facility] 8/14/20232:13 PM

## 2021-12-25 NOTE — Plan of Care (Signed)
  Problem: Education: Goal: Knowledge of General Education information will improve Description: Including pain rating scale, medication(s)/side effects and non-pharmacologic comfort measures Outcome: Progressing   Problem: Clinical Measurements: Goal: Ability to maintain clinical measurements within normal limits will improve Outcome: Progressing Goal: Will remain free from infection Outcome: Progressing Goal: Diagnostic test results will improve Outcome: Progressing Goal: Respiratory complications will improve Outcome: Progressing Goal: Cardiovascular complication will be avoided Outcome: Progressing   Problem: Nutrition: Goal: Adequate nutrition will be maintained Outcome: Progressing   Problem: Elimination: Goal: Will not experience complications related to bowel motility Outcome: Progressing

## 2021-12-25 NOTE — NC FL2 (Signed)
[place] LEVEL OF CARE SCREENING TOOL     IDENTIFICATION  Patient Name: Katie [name] Birthdate: [birth year] Sex: female Admission Date (Current Location): 12/11/2021  [facility] and [place] Number:  Engineering geologist and Address:  [facility], [address] [place], [place] [address]      Provider Number: [id number]  Attending Physician Name and Address:  Ezekiel Slocumb, DO  Relative Name and Phone Number:  Lea Richardson,[phone]    Current Level of Care: Hospital Recommended Level of Care: [place] Prior Approval Number:    Date Approved/Denied:   PASRR Number: [id number]  Discharge Plan: Other (Comment) (ALF)    Current Diagnoses: Patient Active Problem List   Diagnosis Date Noted   Cellulitis of left upper extremity 12/11/2021   Type II diabetes mellitus with renal manifestations ([place]) 12/11/2021   Anemia in ESRD (end-stage renal disease) ([place]) 12/11/2021   Elevated lactic acid level 12/11/2021   Chronic diastolic CHF (congestive heart failure) ([place]) 12/11/2021   Left arm cellulitis 11/09/2021   Adjustment disorder with mixed anxiety and depressed mood 11/07/2021   Aortic stenosis 11/01/2021   Fecal impaction ([place]) 10/27/2021   Bent bone fracture of ulna 10/24/2021   Hospital acquired PNA 10/24/2021   Sepsis ([place]) 10/23/2021   Cellulitis 10/23/2021   Right flank pain 09/23/2021   Iron deficiency anemia, unspecified 03/27/2021   Allergy, unspecified, initial encounter 03/22/2021   Anaphylactic shock, unspecified, initial encounter 03/22/2021   Unspecified jaundice 03/15/2021   Allergy status to penicillin 03/14/2021   Anemia in chronic kidney disease 03/14/2021   Chronic diastolic (congestive) heart failure ([place]) 03/14/2021   Dependence on renal dialysis ([place]) 03/14/2021   Depression, unspecified 03/14/2021   Hypertensive heart and chronic kidney disease with heart failure and with stage 5  chronic kidney disease, or end stage renal disease ([place]) 03/14/2021   Personal history of nicotine dependence 03/14/2021   Secondary hyperparathyroidism of renal origin ([place]) 03/14/2021   Frequent falls 03/07/2021   Financial difficulty 03/07/2021   ESRD (end stage renal disease) on dialysis ([place]) 12/23/2020   Persistent headaches 06/27/2020   ATN (acute tubular necrosis) (HCC)    Pulmonary hypertension, unspecified (HCC)    Chronic kidney disease (CKD), stage IV (severe) (HCC)    Pleural effusion    Acute heart failure with preserved ejection fraction (HFpEF) (HCC)    Pressure injury of skin 05/01/2020   Hyperkalemia    Volume overload 04/30/2020   Hypertension    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HCC)    Type 2 diabetes mellitus (HCC)     Orientation RESPIRATION BLADDER Height & Weight     Self, Time, Situation, Place  Normal Continent Weight: 73 kg Height:  5\' 6"  (167.6 cm)  BEHAVIORAL SYMPTOMS/MOOD NEUROLOGICAL BOWEL NUTRITION STATUS     (n/a) Continent Diet (Renal)  AMBULATORY STATUS COMMUNICATION OF NEEDS Skin   Limited Assist Verbally Ecchymosis bilateral arms                       Personal Care Assistance Level of Assistance  Bathing, Feeding, Dressing Bathing Assistance: Limited assistance Feeding assistance: Limited assistance Dressing Assistance: Limited assistance     Functional Limitations Info  Sight Sight Info: Impaired Hearing Info: Adequate Speech Info: Adequate    SPECIAL CARE FACTORS FREQUENCY                       Contractures Contractures  Info: Not present    Additional Factors Info  Code Status, Allergies Code Status Info: FULL Allergies Info: Penicillins           Current Medications (12/25/2021):  This is the current hospital active medication list TAKE these medications     acetaminophen 500 MG tablet Commonly known as: TYLENOL Take 500 mg by mouth every 6 (six) hours as needed for moderate pain.     carvedilol 6.25 MG tablet Commonly known as: COREG Take 1 tablet (6.25 mg total) by mouth every evening.    citalopram 10 MG tablet Commonly known as: CELEXA Take 1 tablet (10 mg total) by mouth daily.    furosemide 40 MG tablet Commonly known as: LASIX Take 1 tablet (40 mg total) by mouth daily.    ondansetron 4 MG disintegrating tablet Commonly known as: ZOFRAN-ODT Take 1 tablet (4 mg total) by mouth every 8 (eight) hours as needed for nausea or vomiting.    oxyCODONE-acetaminophen 5-325 MG tablet Commonly known as: PERCOCET/ROXICET Take 1 tablet by mouth every 8 (eight) hours as needed for moderate pain or severe pain.    pantoprazole 40 MG tablet Commonly known as: PROTONIX Take 1 tablet (40 mg total) by mouth 2 (two) times daily.    senna-docusate 8.6-50 MG tablet Commonly known as: Senokot-S Take 1 tablet by mouth 2 (two) times daily as needed for mild constipation. What changed:  when to take this reasons to take this    tiZANidine 4 MG tablet Commonly known as: ZANAFLEX Take 4 mg by mouth 2 (two) times daily as needed.    topiramate 50 MG tablet Commonly known as: TOPAMAX Take 1 tablet (50 mg total) by mouth 2 (two) times daily as needed (headache).    traZODone 50 MG tablet Commonly known as: DESYREL Take 1 tablet (50 mg total) by mouth every evening.     Discharge Medications: Please see discharge summary for a list of discharge medications.  Relevant Imaging Results:  Relevant Lab Results:   Additional Information SS# [id number]  Laurena Slimmer, RN

## 2021-12-25 NOTE — Discharge Summary (Signed)
Physician Discharge Summary   Patient: Katie [name] MRN: [id number] DOB: 29-Jun-1955  Admit date:     12/11/2021  Discharge date: 12/25/21  Discharge Physician: Ezekiel Slocumb   PCP: Pcp, No   Recommendations at discharge:    Resume usual dialysis tomorrow, Tuesday 12/26/21 Follow up with Primary Care in 1-2 weeks Repeat CBC, BMP in 1-2 weeks Follow up with nephrology as scheduled  Discharge Diagnoses: Principal Problem:   Cellulitis of left upper extremity Active Problems:   ESRD (end stage renal disease) on dialysis ([place])   Hypertension   Anemia in ESRD (end-stage renal disease) (HCC)   Elevated lactic acid level   Chronic diastolic CHF (congestive heart failure) (HCC)   Depression, unspecified   Type II diabetes mellitus with renal manifestations ([place])  Resolved Problems:   * No resolved hospital problems. *  Hospital Course: Katie [name] is a 66 y.o. female with medical history significant for ESRD on hemodialysis, type II DM, hypertension, homelessness, sepsis from pneumonia in June 2022, left upper extremity cellulitis in June 2023, dog bite infection left upper extremity June 2023, chronic disease, aortic stenosis, fecal impaction, recent discharge from the hospital on 11/26/2021.  She has an old left arm fistula but  she had been getting hemodialysis through a right upper chest wall permacath.   She was admitted for presumed left upper extremity cellulitis.   Treated with IV antibiotics for 7 days, but no significant improvement, making lymphedema with secondary severe inflammation more likely.  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  She was treated with a few more days of IV antibiotics post-procedure.  Left arm swelling and erythema has essentially resolved.  Pt still having some pain and discomfort from the inflammation but drastically improved.  She has been dialyzed using the fistula  with no complications.  Medically stable today to return to her ALF. Resume usual dialysis schedule outpatient.   Assessment and Plan: * Cellulitis of left upper extremity Cellulitis ruled out after no response to IV antibiotics.  She had severe lymphedema and inflammation due to stenosis near her fistula.  Symptoms resolved after angioplasty. Treated with few days IV antibiotics post-procedure --Continue pain control PRN --AV fistula used for dialysis Sat 8/12 --Monitor the left arm  ESRD (end stage renal disease) on dialysis [facility]) - nephrology consulted for dialysis  Hypertension BP's overall stable. --Continue Coreg and Lasix  Anemia in ESRD (end-stage renal disease) (HCC) Hemoglobin stable 9.2 --Repeat CBC at follow up  Elevated lactic acid level Lactic acid 2.2 on admission, resolved.  Chronic diastolic CHF (congestive heart failure) (HCC) Stable.  Echo on 03/07/2021 showed EF of 60 to 65%.   --Volume management by dialysis  Depression, unspecified - Continue Celexa  Type II diabetes mellitus with renal manifestations ([place]) Not on medications.   Last A1C 4.7% in May.         Consultants: Nephrology, vascular surgery  Procedures performed:  1.  Left brachial artery to axillary vein arteriovenous graft cannulation under ultrasound guidance 2.  Left arm shuntogram 3.  Percutaneous transluminal angioplasty of left axillary vein and venous anastomotic stenosis with 7 mm diameter Lutonix drug-coated angioplasty balloon  Disposition: Assisted living  Diet recommendation:   Discharge Diet Orders (From admission, onward)     Start     Ordered   12/25/21 0000  Diet - low sodium heart healthy        12/25/21 0824  Renal diet DISCHARGE MEDICATION: Allergies as of 12/25/2021       Reactions   Penicillins Nausea And Vomiting   Pt states "only pills" make her vomit Other reaction(s): Nausea/Vomit        Medication List     TAKE these  medications    acetaminophen 500 MG tablet Commonly known as: TYLENOL Take 500 mg by mouth every 6 (six) hours as needed for moderate pain.   carvedilol 6.25 MG tablet Commonly known as: COREG Take 1 tablet (6.25 mg total) by mouth every evening.   citalopram 10 MG tablet Commonly known as: CELEXA Take 1 tablet (10 mg total) by mouth daily.   furosemide 40 MG tablet Commonly known as: LASIX Take 1 tablet (40 mg total) by mouth daily.   ondansetron 4 MG disintegrating tablet Commonly known as: ZOFRAN-ODT Take 1 tablet (4 mg total) by mouth every 8 (eight) hours as needed for nausea or vomiting.   oxyCODONE-acetaminophen 5-325 MG tablet Commonly known as: PERCOCET/ROXICET Take 1 tablet by mouth every 8 (eight) hours as needed for moderate pain or severe pain.   pantoprazole 40 MG tablet Commonly known as: PROTONIX Take 1 tablet (40 mg total) by mouth 2 (two) times daily.   senna-docusate 8.6-50 MG tablet Commonly known as: Senokot-S Take 1 tablet by mouth 2 (two) times daily as needed for mild constipation. What changed:  when to take this reasons to take this   tiZANidine 4 MG tablet Commonly known as: ZANAFLEX Take 4 mg by mouth 2 (two) times daily as needed.   topiramate 50 MG tablet Commonly known as: TOPAMAX Take 1 tablet (50 mg total) by mouth 2 (two) times daily as needed (headache).   traZODone 50 MG tablet Commonly known as: DESYREL Take 1 tablet (50 mg total) by mouth every evening.        Contact information for after-discharge care     Destination     [facility] .   Service: Assisted Living Contact information: [place] [phone]                    Discharge Exam: Danley Danker Weights   12/21/21 1120 12/23/21 1210 12/23/21 1645  Weight: 71.2 kg 73.3 kg 73 kg   General exam: awake, alert, no acute distress HEENT: Pinpoint pupils, moist mucus membranes, hearing grossly normal   Respiratory system: CTAB, no wheezes, rales or rhonchi, normal respiratory effort. Cardiovascular system: normal S1/S2, RRR, no pedal edema.   Central nervous system: A&O x3. no gross focal neurologic deficits, normal speech Extremities: Left upper extremity AV fistula present, nearly resolved left upper extremity swelling, resolved edema Skin: dry, intact, normal temperature Psychiatry: normal mood, congruent affect, judgement and insight appear normal   Condition at discharge: stable  The results of significant diagnostics from this hospitalization (including imaging, microbiology, ancillary and laboratory) are listed below for reference.   Imaging Studies: PERIPHERAL VASCULAR CATHETERIZATION  Result Date: 12/21/2021 See surgical note for result.  NM WBC SCAN TUMOR LOC LIMITED  Result Date: 12/19/2021 CLINICAL DATA:  LEFT upper extremity cellulitis, sepsis, has known AV graft LUE, question infected graft EXAM: NUCLEAR MEDICINE LEUKOCYTE SCAN TECHNIQUE: Following intravenous administration of radiolabeled white blood cells, images of the head, neck, trunk, and extremities were obtained on subsequent days. SPECT CT imaging was also performed. Fused SPECT CT data sets are reviewed in 3 planes. RADIOPHARMACEUTICALS:  9.53 Tc-51m Ceretec labeled autologous leukocytes IV COMPARISON:  None Available.  FINDINGS: Blood flow and blood pool images are normal. Delayed planar and SPECT CT images show no abnormal osseous or soft tissue localization of labeled leukocytes within the LEFT upper extremity or visualized thorax to suggest focal infection. Specifically, no abnormal tracer uptake is seen along the AV graft. Additional CT findings: Soft tissue edema and skin thickening LEFT upper extremity. Significant atelectasis in LEFT lower lobe especially at medial basilar portion. Subsegmental atelectasis RIGHT lower lobe, minimally RIGHT middle lobe. Small RIGHT pleural effusion. Scattered atherosclerotic  calcifications aorta and upper abdominal visceral vessels. Coronary arterial calcifications. No thoracic adenopathy. IMPRESSION: Negative labeled leukocytes study. Specifically, no scintigraphic evidence of infected LEFT upper extremity dialysis graft. Bibasilar atelectasis, greater in LEFT lower lobe. Aortic Atherosclerosis (ICD10-I70.0). Electronically Signed   By: Lavonia Dana M.D.   On: 12/19/2021 12:33   MR FOREARM LEFT W WO CONTRAST  Result Date: 12/13/2021 CLINICAL DATA:  Soft tissue mass, forearm, deep; Soft tissue mass, upper arm, deep EXAM: MRI OF THE LEFT FOREARM WITHOUT AND WITH CONTRAST; MRI OF THE LEFT HUMERUS WITHOUT AND WITH CONTRAST TECHNIQUE: Multiplanar, multisequence MR imaging of the left forearm and left humerus was performed before and after the administration of intravenous contrast. CONTRAST:  66mL GADAVIST GADOBUTROL 1 MMOL/ML IV SOLN COMPARISON:  None Available. FINDINGS: Bones/Joint/Cartilage There is no significant marrow signal alteration. The cortex is intact. No aggressive bone lesion. There is a trace elbow joint effusion. Muscles and Tendons There is generalized loss of muscle bulk. There is intramuscular edema within the brachialis, brachioradialis, and involving the flexor compartment musculature of the forearm with mild interfascial edema. There is no intramuscular collection. Soft tissues There is extensive skin thickening and subcutaneous soft tissue swelling of the left upper extremity, extending from the upper arm through the forearm, and worst along the forearm. There is no well-defined/drainable fluid collection. IMPRESSION: Findings are most consistent with left upper extremity cellulitis with myofasciitis in the distal upper arm and flexor compartment of the forearm. No evidence of osteomyelitis or soft tissue abscess. Trace elbow joint effusion without other convincing findings to suggest septic arthritis. Electronically Signed   By: Maurine Simmering M.D.   On: 12/13/2021  08:28   MR HUMERUS LEFT W WO CONTRAST  Result Date: 12/13/2021 CLINICAL DATA:  Soft tissue mass, forearm, deep; Soft tissue mass, upper arm, deep EXAM: MRI OF THE LEFT FOREARM WITHOUT AND WITH CONTRAST; MRI OF THE LEFT HUMERUS WITHOUT AND WITH CONTRAST TECHNIQUE: Multiplanar, multisequence MR imaging of the left forearm and left humerus was performed before and after the administration of intravenous contrast. CONTRAST:  62mL GADAVIST GADOBUTROL 1 MMOL/ML IV SOLN COMPARISON:  None Available. FINDINGS: Bones/Joint/Cartilage There is no significant marrow signal alteration. The cortex is intact. No aggressive bone lesion. There is a trace elbow joint effusion. Muscles and Tendons There is generalized loss of muscle bulk. There is intramuscular edema within the brachialis, brachioradialis, and involving the flexor compartment musculature of the forearm with mild interfascial edema. There is no intramuscular collection. Soft tissues There is extensive skin thickening and subcutaneous soft tissue swelling of the left upper extremity, extending from the upper arm through the forearm, and worst along the forearm. There is no well-defined/drainable fluid collection. IMPRESSION: Findings are most consistent with left upper extremity cellulitis with myofasciitis in the distal upper arm and flexor compartment of the forearm. No evidence of osteomyelitis or soft tissue abscess. Trace elbow joint effusion without other convincing findings to suggest septic arthritis. Electronically Signed   By: Edison Nasuti  Chancy Milroy M.D.   On: 12/13/2021 08:28   US Venous Img Upper Uni Left  Result Date: 12/11/2021 CLINICAL DATA:  Pain and swelling in LEFT arm, dog by 2 weeks ago, has fistula in same arm EXAM: LEFT UPPER EXTREMITY VENOUS DOPPLER ULTRASOUND TECHNIQUE: Gray-scale sonography with graded compression, as well as color Doppler and duplex ultrasound were performed to evaluate the upper extremity deep venous system from the level of the  subclavian vein and including the jugular, axillary, basilic, radial, ulnar and upper cephalic vein. Spectral Doppler was utilized to evaluate flow at rest and with distal augmentation maneuvers. COMPARISON:  11/19/2021 FINDINGS: Contralateral Subclavian Vein: Respiratory phasicity is normal and symmetric with the symptomatic side. No evidence of thrombus. Normal compressibility. Internal Jugular Vein: No evidence of thrombus. Normal compressibility, respiratory phasicity and response to augmentation. Subclavian Vein: No evidence of thrombus. Normal compressibility, respiratory phasicity and response to augmentation. Axillary Vein: No evidence of thrombus. Normal compressibility, respiratory phasicity and response to augmentation. Cephalic Vein: No evidence of thrombus. Normal compressibility, respiratory phasicity and response to augmentation. Basilic Vein: No evidence of thrombus. Normal compressibility, respiratory phasicity and response to augmentation. Brachial Veins: No evidence of thrombus. Normal compressibility, respiratory phasicity and response to augmentation. Radial Veins: No evidence of thrombus. Normal compressibility, respiratory phasicity and response to augmentation. Ulnar Veins: No evidence of thrombus. Normal compressibility, respiratory phasicity and response to augmentation. Venous Reflux:  None visualized. Other Findings: Dialysis AV fistula present, patent. Scattered soft tissue swelling LEFT forearm. IMPRESSION: No evidence of DVT within the LEFT upper extremity. Patent dialysis fistula. Electronically Signed   By: Lavonia Dana M.D.   On: 12/11/2021 11:29   DG Chest Port 1 View  Result Date: 12/11/2021 CLINICAL DATA:  Questionable sepsis. EXAM: PORTABLE CHEST 1 VIEW COMPARISON:  November 23, 2021 FINDINGS: Patient is rotated. Right chest dual lumen catheter with tips projecting over the superior cavoatrial junction/right atrium. Enlarged cardiac silhouette. Aortic atherosclerosis. Linear band  of scarring versus atelectasis in the left lung base. Bibasilar atelectasis. No new focal airspace consolidation. No visible pleural effusion or pneumothorax. No acute osseous abnormality. IMPRESSION: Similar bibasilar atelectasis. No new focal airspace consolidation. Electronically Signed   By: Dahlia Bailiff M.D.   On: 12/11/2021 10:58    Microbiology: Results for orders placed or performed during the hospital encounter of 12/11/21  Blood Culture (routine x 2)     Status: None   Collection Time: 12/11/21 10:21 AM   Specimen: BLOOD  Result Value Ref Range Status   Specimen Description BLOOD LEFT [facility]  Final   Special Requests   Final    BOTTLES DRAWN AEROBIC AND ANAEROBIC Blood Culture adequate volume   Culture   Final    NO GROWTH 5 DAYS Performed at [facility], [address] [place], [place] [address]    Report Status 12/16/2021 FINAL  Final  Blood Culture (routine x 2)     Status: None   Collection Time: 12/11/21 10:21 AM   Specimen: BLOOD  Result Value Ref Range Status   Specimen Description BLOOD RIGHT ARM  Final   Special Requests   Final    BOTTLES DRAWN AEROBIC AND ANAEROBIC Blood Culture results may not be optimal due to an excessive volume of blood received in culture bottles   Culture   Final    NO GROWTH 5 DAYS Performed at [facility], [address], [place], [place] [address]    Report Status 12/16/2021 FINAL  Final  Urine Culture  Status: Abnormal   Collection Time: 12/11/21  5:36 PM   Specimen: Urine, Random  Result Value Ref Range Status   Specimen Description   Final    URINE, RANDOM Performed at [facility], [address] [place], [place] [address]    Special Requests   Final    NONE Performed at [facility], [address] [place], [place] [address]    Culture >=100,000 COLONIES/mL PROTEUS MIRABILIS (A)  Final   Report Status 12/14/2021 FINAL  Final   Organism ID, Bacteria PROTEUS MIRABILIS (A)  Final       Susceptibility   Proteus mirabilis - MIC*    AMPICILLIN <=2 SENSITIVE Sensitive     CEFAZOLIN 8 SENSITIVE Sensitive     CEFEPIME <=0.12 SENSITIVE Sensitive     CEFTRIAXONE <=0.25 SENSITIVE Sensitive     CIPROFLOXACIN 1 RESISTANT Resistant     GENTAMICIN <=1 SENSITIVE Sensitive     IMIPENEM 1 SENSITIVE Sensitive     NITROFURANTOIN 128 RESISTANT Resistant     TRIMETH/SULFA <=20 SENSITIVE Sensitive     AMPICILLIN/SULBACTAM <=2 SENSITIVE Sensitive     PIP/TAZO <=4 SENSITIVE Sensitive     * >=100,000 COLONIES/mL PROTEUS MIRABILIS    Labs: CBC: Recent Labs  Lab 12/19/21 0800 12/22/21 0442  WBC 5.9 6.4  HGB 9.5* 9.2*  HCT 31.4* 29.7*  MCV 98.1 97.4  PLT 181 650   Basic Metabolic Panel: Recent Labs  Lab 12/19/21 0800 12/22/21 0442  NA 138 136  K 4.2 4.1  CL 103 101  CO2 26 30  GLUCOSE 107* 87  BUN 42* 26*  CREATININE 5.78* 3.39*  CALCIUM 9.0 8.8*  PHOS 4.3 2.9   Liver Function Tests: Recent Labs  Lab 12/19/21 0800 12/22/21 0442  ALBUMIN 2.6* 2.6*   CBG: No results for input(s): "GLUCAP" in the last 168 hours.  Discharge time spent: greater than 30 minutes.  Signed: Ezekiel Slocumb, DO [facility] 12/25/2021

## 2021-12-25 NOTE — Assessment & Plan Note (Signed)
Not on medications.   Last A1C 4.7% in May.

## 2021-12-25 NOTE — TOC Transition Note (Signed)
Transition of Care [facility]) - CM/SW Discharge Note   Patient Details  Name: Katie [name] MRN: [id number] Date of Birth: 04-May-1956  [facility]) CM/SW Contact:  Laurena Slimmer, RN Phone Number: 12/25/2021, 10:59 AM   Clinical Narrative:    Spoke with patient regarding discharge. Advised of discharge back [place] today. Patient stated shew will needs assistance with tranfers. EMS arranged.   FL2 completed and faxed to [phone] and emailed to [email or web address].   TOC signing off.          Patient Goals and CMS Choice        Discharge Placement                       Discharge Plan and Services                                     Social Determinants of Health (SDOH) Interventions     Readmission Risk Interventions    09/27/2021    1:44 PM 09/26/2021   12:46 PM 03/08/2021    2:49 PM  Readmission Risk Prevention Plan  Transportation Screening  Complete Complete  Palliative Care Screening Not Applicable    Medication Review (RN Care Manager)  Complete Complete  HRI or Home Care Consult   Complete  SW Recovery Care/Counseling Consult  Complete   Palliative Care Screening  Not Applicable   [place]  Not Applicable Complete

## 2021-12-25 NOTE — TOC Progression Note (Signed)
Transition of Care [facility]) - Progression Note    Patient Details  Name: Katie [name] MRN: [id number] Date of Birth: April 13, 1956  Transition of Care [facility]) CM/SW Contact  Laurena Slimmer, RN Phone Number: 12/25/2021, 10:10 AM  Clinical Narrative:    Attempt to reach a Freight forwarder at [employer]. Transferred to Catalina Lunger, Manager of Memory care. No answer unable to leave a message. Called back to facility. Was advised to speak with Catina. No answer at Catina's VM. Left a message with phone represenatative, Renee for a member of management to return call to this CM.         Expected Discharge Plan and Services           Expected Discharge Date: 12/25/21                                     Social Determinants of Health (SDOH) Interventions    Readmission Risk Interventions    09/27/2021    1:44 PM 09/26/2021   12:46 PM 03/08/2021    2:49 PM  Readmission Risk Prevention Plan  Transportation Screening  Complete Complete  Palliative Care Screening Not Applicable    Medication Review (RN Care Manager)  Complete Complete  HRI or Home Care Consult   Complete  SW Recovery Care/Counseling Consult  Complete   Palliative Care Screening  Not Applicable   [place]  Not Applicable Complete

## 2022-01-02 ENCOUNTER — Other Ambulatory Visit (INDEPENDENT_AMBULATORY_CARE_PROVIDER_SITE_OTHER): Payer: Self-pay | Admitting: Vascular Surgery

## 2022-01-02 DIAGNOSIS — N184 Chronic kidney disease, stage 4 (severe): Secondary | ICD-10-CM

## 2022-01-02 DIAGNOSIS — Z9862 Peripheral vascular angioplasty status: Secondary | ICD-10-CM

## 2022-01-03 ENCOUNTER — Encounter (INDEPENDENT_AMBULATORY_CARE_PROVIDER_SITE_OTHER): Payer: Medicaid Other

## 2022-01-03 ENCOUNTER — Ambulatory Visit (INDEPENDENT_AMBULATORY_CARE_PROVIDER_SITE_OTHER): Payer: Medicaid Other | Admitting: Nurse Practitioner

## 2022-01-05 ENCOUNTER — Non-Acute Institutional Stay: Payer: Medicaid Other | Admitting: Student

## 2022-01-05 DIAGNOSIS — R195 Other fecal abnormalities: Secondary | ICD-10-CM

## 2022-01-05 DIAGNOSIS — N186 End stage renal disease: Secondary | ICD-10-CM

## 2022-01-05 DIAGNOSIS — R110 Nausea: Secondary | ICD-10-CM

## 2022-01-05 DIAGNOSIS — Z515 Encounter for palliative care: Secondary | ICD-10-CM

## 2022-01-05 DIAGNOSIS — Z992 Dependence on renal dialysis: Secondary | ICD-10-CM

## 2022-01-05 DIAGNOSIS — R531 Weakness: Secondary | ICD-10-CM

## 2022-01-05 DIAGNOSIS — R52 Pain, unspecified: Secondary | ICD-10-CM

## 2022-01-05 NOTE — Progress Notes (Unsigned)
Therapist, nutritional Palliative Care Consult Note Telephone: [phone]  Fax: [phone]   Date of encounter: 01/05/22 1:27 PM PATIENT NAME: Katie [name] [address] [place] [address]   [phone] (home)  DOB: December 09, 1955 MRN: [id number] PRIMARY CARE PROVIDER:    Christoper Allegra, NP  REFERRING PROVIDER:   Christoper Allegra, NP  RESPONSIBLE PARTY:    Contact Information     Name Relation Home Work Mobile   [place] [phone]  [phone]        I met face to face with patient in the facility. Palliative Care was asked to follow this patient by consultation request of  Katie Allegra, NP  to address advance care planning and complex medical decision making. This is the initial visit.                                     ASSESSMENT AND PLAN / RECOMMENDATIONS:   Advance Care Planning/Goals of Care: Goals include to maximize quality of life and symptom management. Patient/health care surrogate gave his/her permission to discuss.Our advance care planning conversation included a discussion about:    The value and importance of advance care planning  Experiences with loved ones who have been seriously ill or have died  Exploration of personal, cultural or spiritual beliefs that might influence medical decisions  Exploration of goals of care in the event of a sudden injury or illness  Decision not to resuscitate or to de-escalate disease focused treatments due to poor prognosis. CODE STATUS: DNR  Education provided on Palloiative medicine. Discussed palliative medicine vs. Hospice services. Patient wishes to continue dialysis. She wants to have a good Christmas; she states that   Symptom Management/Plan:    Follow up Palliative Care Visit: Palliative care will continue to follow for complex medical decision making, advance care planning, and clarification of goals. Return *** weeks or prn.  I spent *** minutes  providing this consultation. More than 50% of the time in this consultation was spent in counseling and care coordination.  This visit was coded based on medical decision making (MDM).***  PPS: ***0%  HOSPICE ELIGIBILITY/DIAGNOSIS: TBD  Chief Complaint: ***  HISTORY OF PRESENT ILLNESS:  Katie [name] is a 66 y.o. year old female  with *** .   History obtained from review of EMR, discussion with primary team, and interview with family, facility staff/caregiver and/or Katie [name].  I reviewed available labs, medications, imaging, studies and related documents from the EMR.  Records reviewed and summarized above.   ROS  *** General: NAD EYES: denies vision changes ENMT: denies dysphagia Cardiovascular: denies chest pain, denies DOE Pulmonary: denies cough, denies increased SOB Abdomen: endorses good appetite, denies constipation, endorses continence of bowel GU: denies dysuria, endorses continence of urine MSK:  denies increased weakness,  no falls reported Skin: denies rashes or wounds Neurological: denies pain, denies insomnia Psych: Endorses positive mood Heme/lymph/immuno: denies bruises, abnormal bleeding  Physical Exam: Current and past weights: Constitutional: NAD General: frail appearing, thin/WNWD/obese  EYES: anicteric sclera, lids intact, no discharge  ENMT: intact hearing, oral mucous membranes moist, dentition intact CV: S1S2, RRR, no LE edema Pulmonary: LCTA, no increased work of breathing, no cough, room air Abdomen: intake 100%, normo-active BS + 4 quadrants, soft and non tender, no ascites GU: deferred MSK: no sarcopenia, moves all extremities, ambulatory Skin: warm and dry, no rashes or wounds on visible skin  Neuro:  no generalized weakness,  no cognitive impairment Psych: non-anxious affect, A and O x 3 Hem/lymph/immuno: no widespread bruising CURRENT PROBLEM LIST:  Patient Active Problem List   Diagnosis Date Noted   Cellulitis of left upper extremity  12/11/2021   Type II diabetes mellitus with renal manifestations ([place]) 12/11/2021   Anemia in ESRD (end-stage renal disease) ([place]) 12/11/2021   Elevated lactic acid level 12/11/2021   Chronic diastolic CHF (congestive heart failure) ([place]) 12/11/2021   Left arm cellulitis 11/09/2021   Adjustment disorder with mixed anxiety and depressed mood 11/07/2021   Aortic stenosis 11/01/2021   Fecal impaction ([place]) 10/27/2021   Bent bone fracture of ulna 10/24/2021   Hospital acquired PNA 10/24/2021   Sepsis ([place]) 10/23/2021   Cellulitis 10/23/2021   Right flank pain 09/23/2021   Iron deficiency anemia, unspecified 03/27/2021   Allergy, unspecified, initial encounter 03/22/2021   Anaphylactic shock, unspecified, initial encounter 03/22/2021   Unspecified jaundice 03/15/2021   Allergy status to penicillin 03/14/2021   Anemia in chronic kidney disease 03/14/2021   Chronic diastolic (congestive) heart failure ([place]) 03/14/2021   Dependence on renal dialysis ([place]) 03/14/2021   Depression, unspecified 03/14/2021   Hypertensive heart and chronic kidney disease with heart failure and with stage 5 chronic kidney disease, or end stage renal disease ([place]) 03/14/2021   Personal history of nicotine dependence 03/14/2021   Secondary hyperparathyroidism of renal origin ([place]) 03/14/2021   Frequent falls 03/07/2021   Financial difficulty 03/07/2021   ESRD (end stage renal disease) on dialysis ([place]) 12/23/2020   Persistent headaches 06/27/2020   ATN (acute tubular necrosis) ([place])    Pulmonary hypertension, unspecified (HCC)    Chronic kidney disease (CKD), stage IV (severe) (HCC)    Pleural effusion    Acute heart failure with preserved ejection fraction (HFpEF) ([place])    Pressure injury of skin 05/01/2020   Hyperkalemia    Volume overload 04/30/2020   Hypertension    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[place])    Type 2 diabetes mellitus ([place])    PAST MEDICAL HISTORY:  Active Ambulatory Problems     Diagnosis Date Noted   Volume overload 04/30/2020   Hypertension    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[place])    Type 2 diabetes mellitus ([place])    Hyperkalemia    Pressure injury of skin 05/01/2020   Pleural effusion    Acute heart failure with preserved ejection fraction (HFpEF) (HCC)    Chronic kidney disease (CKD), stage IV (severe) ([place])    Pulmonary hypertension, unspecified (HCC)    ATN (acute tubular necrosis) ([place])    Persistent headaches 06/27/2020   ESRD (end stage renal disease) on dialysis ([place]) 12/23/2020   Frequent falls 03/07/2021   Financial difficulty 03/07/2021   Allergy status to penicillin 03/14/2021   Allergy, unspecified, initial encounter 03/22/2021   Anaphylactic shock, unspecified, initial encounter 03/22/2021   Anemia in chronic kidney disease 03/14/2021   Chronic diastolic (congestive) heart failure ([place]) 03/14/2021   Dependence on renal dialysis ([place]) 03/14/2021   Depression, unspecified 03/14/2021   Hypertensive heart and chronic kidney disease with heart failure and with stage 5 chronic kidney disease, or end stage renal disease ([place]) 03/14/2021   Iron deficiency anemia, unspecified 03/27/2021   Personal history of nicotine dependence 03/14/2021   Secondary hyperparathyroidism of renal origin ([place]) 03/14/2021   Unspecified jaundice 03/15/2021   Right flank pain 09/23/2021   Sepsis ([place]) 10/23/2021   Cellulitis  10/23/2021   Bent bone fracture of ulna 10/24/2021   Hospital acquired PNA 10/24/2021   Fecal impaction ([place]) 10/27/2021   Aortic stenosis 11/01/2021   Adjustment disorder with mixed anxiety and depressed mood 11/07/2021   Left arm cellulitis 11/09/2021   Cellulitis of left upper extremity 12/11/2021   Type II diabetes mellitus with renal manifestations ([place]) 12/11/2021   Anemia in ESRD (end-stage renal disease) ([place]) 12/11/2021   Elevated lactic acid level 12/11/2021   Chronic diastolic CHF (congestive heart failure) ([place])  12/11/2021   Resolved Ambulatory Problems    Diagnosis Date Noted   Type 2 diabetes mellitus with stage 5 chronic kidney disease ([place]) 04/30/2020   Infected dog bite of hand including fingers, left, initial encounter 09/23/2021   CAP (community acquired pneumonia) 10/24/2021   Past Medical History:  Diagnosis Date   Renal disorder    SOCIAL HX:  Social History   Tobacco Use   Smoking status: Former    Types: Cigarettes   Smokeless tobacco: Never  Substance Use Topics   Alcohol use: Not Currently   FAMILY HX:  Family History  Problem Relation Age of Onset   Heart attack Mother    Asthma Mother    Uterine cancer Mother    Skin cancer Father    Prostate cancer Father    CAD Father    Parkinson's disease Father    Lung cancer Brother       ALLERGIES:  Allergies  Allergen Reactions   Penicillins Nausea And Vomiting    Pt states "only pills" make her vomit Other reaction(s): Nausea/Vomit     PERTINENT MEDICATIONS:  Outpatient Encounter Medications as of 01/05/2022  Medication Sig   acetaminophen (TYLENOL) 500 MG tablet Take 500 mg by mouth every 6 (six) hours as needed for moderate pain.   carvedilol (COREG) 6.25 MG tablet Take 1 tablet (6.25 mg total) by mouth every evening. (Patient not taking: Reported on 12/11/2021)   citalopram (CELEXA) 10 MG tablet Take 1 tablet (10 mg total) by mouth daily. (Patient not taking: Reported on 12/11/2021)   furosemide (LASIX) 40 MG tablet Take 1 tablet (40 mg total) by mouth daily. (Patient not taking: Reported on 12/11/2021)   ondansetron (ZOFRAN-ODT) 4 MG disintegrating tablet Take 1 tablet (4 mg total) by mouth every 8 (eight) hours as needed for nausea or vomiting.   oxyCODONE-acetaminophen (PERCOCET/ROXICET) 5-325 MG tablet Take 1 tablet by mouth every 8 (eight) hours as needed for moderate pain or severe pain.   pantoprazole (PROTONIX) 40 MG tablet Take 1 tablet (40 mg total) by mouth 2 (two) times daily. (Patient not taking:  Reported on 12/11/2021)   senna-docusate (SENOKOT-S) 8.6-50 MG tablet Take 1 tablet by mouth 2 (two) times daily as needed for mild constipation.   tiZANidine (ZANAFLEX) 4 MG tablet Take 4 mg by mouth 2 (two) times daily as needed.   topiramate (TOPAMAX) 50 MG tablet Take 1 tablet (50 mg total) by mouth 2 (two) times daily as needed (headache).   traZODone (DESYREL) 50 MG tablet Take 1 tablet (50 mg total) by mouth every evening.   No facility-administered encounter medications on file as of 01/05/2022.   Thank you for the opportunity to participate in the care of Ms. Alberty.  The palliative care team will continue to follow. Please call our office at [phone] if we can be of additional assistance.   Ezekiel Slocumb, NP   COVID-19 PATIENT SCREENING TOOL Asked and negative response unless otherwise noted:  Have  you had symptoms of covid, tested positive or been in contact with someone with symptoms/positive test in the past 5-10 days? No

## 2022-01-06 ENCOUNTER — Encounter: Payer: Self-pay | Admitting: Intensive Care

## 2022-01-06 ENCOUNTER — Other Ambulatory Visit: Payer: Self-pay

## 2022-01-06 ENCOUNTER — Emergency Department
Admission: EM | Admit: 2022-01-06 | Discharge: 2022-01-06 | Disposition: A | Discharge status: Skilled Nursing Facility | Payer: Medicaid Other | Attending: Emergency Medicine | Admitting: Emergency Medicine

## 2022-01-06 DIAGNOSIS — Z992 Dependence on renal dialysis: Secondary | ICD-10-CM | POA: Diagnosis not present

## 2022-01-06 DIAGNOSIS — I509 Heart failure, unspecified: Secondary | ICD-10-CM | POA: Diagnosis not present

## 2022-01-06 DIAGNOSIS — I132 Hypertensive heart and chronic kidney disease with heart failure and with stage 5 chronic kidney disease, or end stage renal disease: Secondary | ICD-10-CM | POA: Diagnosis not present

## 2022-01-06 DIAGNOSIS — N186 End stage renal disease: Secondary | ICD-10-CM | POA: Diagnosis not present

## 2022-01-06 DIAGNOSIS — E878 Other disorders of electrolyte and fluid balance, not elsewhere classified: Secondary | ICD-10-CM

## 2022-01-06 MED ORDER — ACETAMINOPHEN 500 MG PO TABS
1000.0000 mg | ORAL_TABLET | Freq: Once | ORAL | Status: DC
  Filled 2022-01-06: qty 2

## 2022-01-06 NOTE — ED Notes (Signed)
Patient refusing lab draw in triage

## 2022-01-06 NOTE — ED Notes (Signed)
EDP at bedside with pt.  

## 2022-01-06 NOTE — ED Notes (Signed)
Pt calling RN asking for pain med, stating MD promised something for pain. Pt is up for discharge, waiting for EMS transport to [facility]. EDP informed of pt request.

## 2022-01-06 NOTE — ED Provider Notes (Signed)
[facility] Provider Note    Event Date/Time   First MD Initiated Contact with Patient 01/06/22 1732     (approximate)   History   Chief Complaint: Hypotension   HPI  Katie [name] is a 66 y.o. female with a history of hypertension, end-stage renal disease on hemodialysis for the past 2 years, CHF, chronic ambulatory dysfunction who comes the ED due to low blood pressure at the conclusion of her dialysis session today.  She reports this is a chronic recurring issue and she will often have low blood pressure and then after a few hours her blood pressure is high.  She denies any new symptoms, no chest pain or lightheadedness or motor weakness.  No shortness of breath or other acute complaints.  She feels back to normal and wishes to be discharged back to her assisted living facility.     Physical Exam   Triage Vital Signs: ED Triage Vitals  Enc Vitals Group     BP 01/06/22 1225 102/62     Pulse Rate 01/06/22 1225 74     Resp 01/06/22 1225 16     Temp 01/06/22 1225 (!) 97.5 F (36.4 C)     Temp Source 01/06/22 1225 Oral     SpO2 01/06/22 1225 94 %     Weight 01/06/22 1228 156 lb (70.8 kg)     Height 01/06/22 1228 5\' 6"  (1.676 m)     Head Circumference --      Peak Flow --      Pain Score 01/06/22 1227 7     Pain Loc --      Pain Edu? --      Excl. in [place]? --     Most recent vital signs: Vitals:   01/06/22 1225 01/06/22 1755  BP: 102/62 (!) 192/73  Pulse: 74 83  Resp: 16 20  Temp: (!) 97.5 F (36.4 C) 97.6 F (36.4 C)  SpO2: 94% 98%    General: Awake, no distress.  CV:  Good peripheral perfusion.  Regular rate and rhythm. Resp:  Normal effort.  Clear to auscultation bilaterally Abd:  No distention.  Soft nontender Other:  Left upper extremity AV graft has wound dressing, no inflammatory changes, nontender, no bleeding   ED Results / Procedures / Treatments   Labs (all labs ordered are listed, but only abnormal results are  displayed) Labs Reviewed - No data to display   EKG Interpreted by me Normal sinus rhythm rate of 83.  Normal axis, normal intervals.  Poor R wave progression.  Normal ST segments and T waves.  No ischemic changes.   RADIOLOGY    PROCEDURES:  Procedures   MEDICATIONS ORDERED IN ED: Medications - No data to display   IMPRESSION / MDM / [place] / ED COURSE  I reviewed the triage vital signs and the nursing notes.                              Patient presents with episode of low blood pressure immediately after dialysis.  Possible vagal syndrome versus dialysis disequilibrium syndrome versus fluid shifting.  Doubt ACS PE dissection stroke or infection.  No signs of shock.  In the ED her vital signs are normal, and on recheck of her blood pressure is actually elevated, congruent with her end-stage renal disease.  She is at her baseline state of health.  She declines to have additional work-up.  I did review outside records and see that about 2 weeks ago patient had CBC and chemistry panel which were at chronic baseline.  She has medical decision-making capacity and I do not think that additional work-up is necessary at this time, she is comfortable returning home, continue her outpatient care, and returning to the ED if she has new or worsening symptoms or other concerns.       FINAL CLINICAL IMPRESSION(S) / ED DIAGNOSES   Final diagnoses:  Dialysis disequilibrium syndrome     Rx / DC Orders   ED Discharge Orders     None        Note:  This document was prepared using Dragon voice recognition software and may include unintentional dictation errors.   Carrie Mew, MD 01/06/22 [phone]

## 2022-01-06 NOTE — ED Triage Notes (Signed)
Pt in via EMS from [facility] dialysis with c/o dizsziness. Pt got full treatment and got dizzy when she stood up. BP dropped from 70's to 40's.

## 2022-01-06 NOTE — ED Triage Notes (Signed)
Patient brought from dialysis. Reports finishing treatment. Dialysis sent patient to ER for hypotension and dizziness.   Patient reporting she does not want to be here and wants to go back to [facility]

## 2022-01-06 NOTE — ED Notes (Signed)
Pt was told to come to ED from dialysis for hypotension. Pt is not hypotensive and states that she frequently experiences labile BP. VS rechecked, pt declined bloodwork and spoke with EDP, and EKG will be done. Food and drink provided per request and skin abrasion to R forearm cleaned and dressed.

## 2022-01-06 NOTE — ED Provider Triage Note (Signed)
Emergency Medicine Provider Triage Evaluation Note  Katie [name], a 66 y.o. female  was evaluated in triage.  Pt complains of hypotension following dialysis.  Patient presents to the ED after the dialysis nurse called EMS for the patient's report of dizziness and low blood pressure after receiving dialysis.  Patient reports she often gets low blood pressure and dizziness following her treatments which she does not tolerate very well.  She does not want to be here for evaluation.  According to EMS report, patient BP dropped from 19E to 17E diastolic.  Review of Systems  Positive: Hypotension, dizziness Negative: CP, SOB  Physical Exam  BP 102/62 (BP Location: Right Arm)   Pulse 74   Temp (!) 97.5 F (36.4 C) (Oral)   Resp 16   Ht 5\' 6"  (1.676 m)   Wt 70.8 kg   SpO2 94%   BMI 25.18 kg/m  Gen:   Awake, no distress  NAD Resp:  Normal effort CTA MSK:   Moves extremities without difficulty  Other:  RRR  Medical Decision Making  Medically screening exam initiated at 12:32 PM.  Appropriate orders placed.  Katie [name] was informed that the remainder of the evaluation will be completed by another provider, this initial triage assessment does not replace that evaluation, and the importance of remaining in the ED until their evaluation is complete.  Patient to the ED for evaluation of hypotension and dizziness following dialysis treatment.  Patient reports similar episodes after she dialyzes.  She does not want to be evaluated or admitted to the hospital.  She is requesting discharge home at this time.   Melvenia Needles, PA-C 01/06/22 1244

## 2022-01-06 NOTE — ED Triage Notes (Signed)
Pt refusing to have VS rechecked.

## 2022-01-17 ENCOUNTER — Telehealth (INDEPENDENT_AMBULATORY_CARE_PROVIDER_SITE_OTHER): Payer: Self-pay

## 2022-01-17 NOTE — Telephone Encounter (Signed)
A fax was received from [facility] at [place] for a permcath removal. Patient was scheduled with Dr. Lucky Cowboy on 01/22/22 with a 12:00 pm arrival time to the MM. Pre-procedure instructions were faxed to [facility] at [place].

## 2022-01-22 ENCOUNTER — Ambulatory Visit
Admission: RE | Admit: 2022-01-22 | Discharge: 2022-01-22 | Disposition: A | Discharge status: Home / Self Care | Payer: Medicaid Other | Source: Ambulatory Visit | Attending: Vascular Surgery | Admitting: Vascular Surgery

## 2022-01-22 ENCOUNTER — Other Ambulatory Visit: Payer: Self-pay

## 2022-01-22 ENCOUNTER — Encounter
Admission: RE | Disposition: A | Discharge status: Home / Self Care | Payer: Self-pay | Source: Ambulatory Visit | Attending: Vascular Surgery

## 2022-01-22 ENCOUNTER — Encounter: Payer: Self-pay | Admitting: Vascular Surgery

## 2022-01-22 DIAGNOSIS — I132 Hypertensive heart and chronic kidney disease with heart failure and with stage 5 chronic kidney disease, or end stage renal disease: Secondary | ICD-10-CM | POA: Insufficient documentation

## 2022-01-22 DIAGNOSIS — L03114 Cellulitis of left upper limb: Secondary | ICD-10-CM | POA: Diagnosis not present

## 2022-01-22 DIAGNOSIS — E1122 Type 2 diabetes mellitus with diabetic chronic kidney disease: Secondary | ICD-10-CM | POA: Insufficient documentation

## 2022-01-22 DIAGNOSIS — R7989 Other specified abnormal findings of blood chemistry: Secondary | ICD-10-CM | POA: Insufficient documentation

## 2022-01-22 DIAGNOSIS — I5032 Chronic diastolic (congestive) heart failure: Secondary | ICD-10-CM | POA: Diagnosis not present

## 2022-01-22 DIAGNOSIS — T8249XA Other complication of vascular dialysis catheter, initial encounter: Secondary | ICD-10-CM | POA: Insufficient documentation

## 2022-01-22 DIAGNOSIS — F32A Depression, unspecified: Secondary | ICD-10-CM | POA: Diagnosis not present

## 2022-01-22 DIAGNOSIS — Y841 Kidney dialysis as the cause of abnormal reaction of the patient, or of later complication, without mention of misadventure at the time of the procedure: Secondary | ICD-10-CM | POA: Insufficient documentation

## 2022-01-22 DIAGNOSIS — D631 Anemia in chronic kidney disease: Secondary | ICD-10-CM | POA: Diagnosis not present

## 2022-01-22 DIAGNOSIS — T8241XA Breakdown (mechanical) of vascular dialysis catheter, initial encounter: Secondary | ICD-10-CM | POA: Diagnosis not present

## 2022-01-22 DIAGNOSIS — Z992 Dependence on renal dialysis: Secondary | ICD-10-CM | POA: Diagnosis not present

## 2022-01-22 DIAGNOSIS — N186 End stage renal disease: Secondary | ICD-10-CM | POA: Insufficient documentation

## 2022-01-22 HISTORY — PX: DIALYSIS/PERMA CATHETER REPAIR: CATH118293

## 2022-01-22 SURGERY — DIALYSIS/PERMA CATHETER REPAIR
Anesthesia: LOCAL

## 2022-01-22 MED ORDER — SODIUM CHLORIDE 0.9 % IV SOLN: INTRAVENOUS | Status: DC

## 2022-01-22 MED ORDER — MIDAZOLAM HCL 2 MG/2ML IJ SOLN
INTRAMUSCULAR | Status: DC | PRN
  Administered 2022-01-22: 1 mg via INTRAVENOUS

## 2022-01-22 MED ORDER — FENTANYL CITRATE PF 50 MCG/ML IJ SOSY
PREFILLED_SYRINGE | INTRAMUSCULAR | Status: AC
  Filled 2022-01-22: qty 1

## 2022-01-22 MED ORDER — VANCOMYCIN HCL IN DEXTROSE 1-5 GM/200ML-% IV SOLN
1000.0000 mg | Freq: Once | INTRAVENOUS | Status: AC
  Administered 2022-01-22: 1000 mg via INTRAVENOUS
  Filled 2022-01-22: qty 200

## 2022-01-22 MED ORDER — FENTANYL CITRATE (PF) 100 MCG/2ML IJ SOLN
INTRAMUSCULAR | Status: DC | PRN
  Administered 2022-01-22: 50 ug via INTRAVENOUS

## 2022-01-22 MED ORDER — MIDAZOLAM HCL 2 MG/2ML IJ SOLN
INTRAMUSCULAR | Status: DC
Start: 2022-01-22 — End: 2022-01-22
  Filled 2022-01-22: qty 2

## 2022-01-22 SURGICAL SUPPLY — 6 items
BIOPATCH RED 1 DISK 7.0 (GAUZE/BANDAGES/DRESSINGS) IMPLANT
CATH PALIN MAXID VT KIT 19CM (CATHETERS) IMPLANT
GUIDEWIRE SUPER STIFF .035X180 (WIRE) IMPLANT
PACK ANGIOGRAPHY (CUSTOM PROCEDURE TRAY) IMPLANT
SUT MNCRL AB 4-0 PS2 18 (SUTURE) IMPLANT
SUT PROLENE 0 CT 1 30 (SUTURE) IMPLANT

## 2022-01-22 NOTE — Op Note (Signed)
OPERATIVE NOTE    PRE-OPERATIVE DIAGNOSIS: 1. ESRD 2. Non-functional permcath  POST-OPERATIVE DIAGNOSIS: same as above  PROCEDURE: Fluoroscopic guidance for placement of catheter Placement of a 19 cm tip to cuff tunneled hemodialysis catheter via the right internal jugular vein and removal of previous catheter  SURGEON: Leotis Pain, MD  ANESTHESIA:  Local with moderate conscious sedation for 15 minutes using 1 mg of Versed and 50 mcg of Fentanyl  ESTIMATED BLOOD LOSS: 5 cc  FINDING(S): none  SPECIMEN(S):  None  INDICATIONS:   Patient is a 66 y.o.female who presents with non-functional dialysis catheter and ESRD.  The patient needs long term dialysis access for their ESRD, and a Permcath is necessary.  Risks and benefits are discussed and informed consent is obtained.    DESCRIPTION: After obtaining full informed written consent, the patient was brought back to the vascular suite. The patient received moderate conscious sedation during a face-to-face encounter with me present throughout the entire procedure and supervising the RN monitoring the vital signs, pulse oximetry, telemetry, and mental status throughout the entire procedure. The patient's existing catheter, right neck and chest were sterilely prepped and draped in a sterile surgical field was created.  The existing catheter was dissected free from the fibrous sheath securing the cuff with hemostats and blunt dissection.  A wire was placed. The existing catheter was then removed and the wire used to keep venous access. I selected a 19 cm tip to cuff tunneled dialysis catheter.  Using fluoroscopic guidance the catheter tips were parked in the right atrium. The appropriate distal connectors were placed. It withdrew blood well and flushed easily with heparinized saline and a concentrated heparin solution was then placed. It was secured to the chest wall with 2 Prolene sutures. A 4-0 Monocryl pursestring suture was placed around the exit  site. Sterile dressings were placed. The patient tolerated the procedure well and was taken to the recovery room in stable condition.  COMPLICATIONS: None  CONDITION: Stable  Leotis Pain 01/22/2022 2:13 PM   This note was created with Dragon Medical transcription system. Any errors in dictation are purely unintentional.

## 2022-01-22 NOTE — Interval H&P Note (Signed)
History and Physical Interval Note:  01/22/2022 1:49 PM  Katie [name]  has presented today for surgery, with the diagnosis of Perma Cath Removal   End Stage Renal.  The various methods of treatment have been discussed with the patient and family. After consideration of risks, benefits and other options for treatment, the patient has consented to  Procedure(s): DIALYSIS/PERMA CATHETER INSERT (N/A) as a surgical intervention.  The patient's history has been reviewed, patient examined, no change in status, stable for surgery.  The patient reports that they are still intermitting using her PermCath as her arm access is not entirely reliable and this currently is malfunctioning and needs to be replaced.  I have reviewed the patient's chart and labs.  Questions were answered to the patient's satisfaction.     Leotis Pain

## 2022-01-23 ENCOUNTER — Encounter: Payer: Self-pay | Admitting: Vascular Surgery

## 2022-01-30 ENCOUNTER — Telehealth (INDEPENDENT_AMBULATORY_CARE_PROVIDER_SITE_OTHER): Payer: Self-pay

## 2022-01-30 NOTE — Telephone Encounter (Signed)
I received a fax for the patient to have a permcath removal and to send the appt to [place]. Patient is scheduled for 02/05/22 with a 1:00 pm arrival time to the MM with Dr. Lucky Cowboy. Pre-procedure instructions will be faxed to [facility] [place].

## 2022-02-05 DIAGNOSIS — N186 End stage renal disease: Secondary | ICD-10-CM

## 2022-02-08 ENCOUNTER — Ambulatory Visit: Payer: Medicaid Other | Admitting: Podiatry

## 2022-02-19 ENCOUNTER — Encounter: Admission: RE | Payer: Self-pay | Source: Ambulatory Visit

## 2022-02-19 ENCOUNTER — Ambulatory Visit: Admission: RE | Admit: 2022-02-19 | Payer: Medicaid Other | Source: Ambulatory Visit | Admitting: Vascular Surgery

## 2022-02-19 DIAGNOSIS — N186 End stage renal disease: Secondary | ICD-10-CM

## 2022-02-19 SURGERY — DIALYSIS/PERMA CATHETER REMOVAL
Anesthesia: LOCAL

## 2022-02-23 ENCOUNTER — Ambulatory Visit (INDEPENDENT_AMBULATORY_CARE_PROVIDER_SITE_OTHER): Payer: Medicaid Other | Admitting: Nurse Practitioner

## 2022-02-23 ENCOUNTER — Encounter (INDEPENDENT_AMBULATORY_CARE_PROVIDER_SITE_OTHER): Payer: Medicaid Other

## 2022-03-16 ENCOUNTER — Telehealth (INDEPENDENT_AMBULATORY_CARE_PROVIDER_SITE_OTHER): Payer: Self-pay

## 2022-03-16 NOTE — Telephone Encounter (Signed)
A fax was received from [facility] for the patient to have a permcath removal. Patient is scheduled with Dr. Lucky Cowboy on 03/26/22 with a 1:15 pm arrival time to the MM . Pre-procedure instructions will be faxed to [facility] at [place].

## 2022-03-21 ENCOUNTER — Non-Acute Institutional Stay: Payer: Medicaid Other | Admitting: Student

## 2022-03-21 DIAGNOSIS — R110 Nausea: Secondary | ICD-10-CM

## 2022-03-21 DIAGNOSIS — N186 End stage renal disease: Secondary | ICD-10-CM

## 2022-03-21 DIAGNOSIS — Z515 Encounter for palliative care: Secondary | ICD-10-CM

## 2022-03-21 DIAGNOSIS — R531 Weakness: Secondary | ICD-10-CM

## 2022-03-26 ENCOUNTER — Ambulatory Visit
Admission: RE | Admit: 2022-03-26 | Discharge: 2022-03-26 | Disposition: A | Discharge status: Home / Self Care | Payer: Medicaid Other | Source: Ambulatory Visit | Attending: Vascular Surgery | Admitting: Vascular Surgery

## 2022-03-26 ENCOUNTER — Encounter
Admission: RE | Disposition: A | Discharge status: Home / Self Care | Payer: Self-pay | Source: Ambulatory Visit | Attending: Vascular Surgery

## 2022-03-26 ENCOUNTER — Encounter: Payer: Self-pay | Admitting: Vascular Surgery

## 2022-03-26 ENCOUNTER — Other Ambulatory Visit: Payer: Self-pay

## 2022-03-26 DIAGNOSIS — Z4901 Encounter for fitting and adjustment of extracorporeal dialysis catheter: Secondary | ICD-10-CM | POA: Insufficient documentation

## 2022-03-26 DIAGNOSIS — Z992 Dependence on renal dialysis: Secondary | ICD-10-CM

## 2022-03-26 DIAGNOSIS — I120 Hypertensive chronic kidney disease with stage 5 chronic kidney disease or end stage renal disease: Secondary | ICD-10-CM | POA: Diagnosis not present

## 2022-03-26 DIAGNOSIS — N186 End stage renal disease: Secondary | ICD-10-CM

## 2022-03-26 DIAGNOSIS — Z87891 Personal history of nicotine dependence: Secondary | ICD-10-CM

## 2022-03-26 DIAGNOSIS — Z95828 Presence of other vascular implants and grafts: Secondary | ICD-10-CM

## 2022-03-26 HISTORY — PX: DIALYSIS/PERMA CATHETER REMOVAL: CATH118289

## 2022-03-26 SURGERY — DIALYSIS/PERMA CATHETER REMOVAL
Anesthesia: LOCAL

## 2022-03-26 SURGICAL SUPPLY — 2 items
FORCEPS HALSTEAD CVD 5IN STRL (INSTRUMENTS) IMPLANT
TRAY LACERAT/PLASTIC (MISCELLANEOUS) IMPLANT

## 2022-03-26 NOTE — Discharge Instructions (Signed)
Tunneled Catheter Removal, Care After Refer to this sheet in the next few weeks. These instructions provide you with information about caring for yourself after your procedure. Your health care provider may also give you more specific instructions. Your treatment has been planned according to current medical practices, but problems sometimes occur. Call your health care provider if you have any problems or questions after your procedure. What can I expect after the procedure? After the procedure, it is common to have: Some mild redness, swelling, and pain around your catheter site.   Follow these instructions at home: Incision care  Check your removal site  every day for signs of infection. Check for: More redness, swelling, or pain. More fluid or blood. Warmth. Pus or a bad smell. Remove your dressing in 48hrs leave open to air  Activity  Return to your normal activities as told by your health care provider. Ask your health care provider what activities are safe for you. Do not lift anything that is heavier than 10 lb (4.5 kg) for 3 days  You may shower tomorrow  Contact a health care provider if: You have more fluid or blood coming from your removal site You have more redness, swelling, or pain at your incisions or around the area where your catheter was removed Your removal site feel warm to the touch. You feel unusually weak. You feel nauseous.. Get help right away if You have swelling in your arm, shoulder, neck, or face. You develop chest pain. You have difficulty breathing. You feel dizzy or light-headed. You have pus or a bad smell coming from your removal site You have a fever. You develop bleeding from your removal site, and your bleeding does not stop. This information is not intended to replace advice given to you by your health care provider. Make sure you discuss any questions you have with your health care provider. Document Released: 04/16/2012 Document Revised:  01/01/2016 Document Reviewed: 01/24/2015 Elsevier Interactive Patient Education  2017 Elsevier Inc. 

## 2022-03-26 NOTE — Progress Notes (Signed)
[facility] Consult Note Telephone: [phone]  Fax: [phone]    Date of encounter: 03/21/22 11:32 AM PATIENT NAME: Katie [name] [address] [name] [address]   [phone] (home)  DOB: 21-Aug-1955 MRN: [id number] PRIMARY CARE PROVIDER:    Lajuana Ripple, NP   REFERRING PROVIDER:   Lajuana Ripple, NP   RESPONSIBLE PARTY:    Contact Information     Name Relation Home Work Mobile   [place] [phone]  [phone]   [place], [place]   [phone]        I met face to face with patient in the facility. Palliative Care was asked to follow this patient by consultation request of  Lajuana Ripple, NP  to address advance care planning and complex medical decision making. This is a follow up visit.                                   ASSESSMENT AND PLAN / RECOMMENDATIONS:   Advance Care Planning/Goals of Care: Goals include to maximize quality of life and symptom management. Patient/health care surrogate gave his/her permission to discuss. Our advance care planning conversation included a discussion about:    The value and importance of advance care planning  Experiences with loved ones who have been seriously ill or have died  Exploration of personal, cultural or spiritual beliefs that might influence medical decisions  Exploration of goals of care in the event of a sudden injury or illness  CODE STATUS: DNR  Education provided on Palliative Medicine. Will continue to provide supportive care, symptom management as needed.  Symptom Management/Plan:  ESRD-continues on hemodialysis Tuesday-Thursday-Saturday. She has been tolerating sessions. She is to have permacath to right upper chest wall removed. Continue percocet for occasional pain.   Weakness-improving; she is ambulating more. No falls. Staff to assist with adl's as needed.   Nausea-occasional nausea; continue PRN  ondansetron.   Follow up Palliative Care Visit: Palliative care will continue to follow for complex medical decision making, advance care planning, and clarification of goals. Return in 8 weeks or prn.   This visit was coded based on medical decision making (MDM).  PPS: 50%  HOSPICE ELIGIBILITY/DIAGNOSIS: TBD  Chief Complaint: Palliative Medicine follow up visit.   HISTORY OF PRESENT ILLNESS:  Katie [name] is a 66 y.o. year old female  with ESRD on hemodialysis Tuesday/Thursday/Saturday, hypertension, T2DM, anemia, hypertensive heart and kidney disease, chronic diastolic HF, depression.    Patient resides at [facility]. She reports having more good days. She is tolerating HD. She is scheduled to have permacath removed. She does report occasional left arm pain. No shortness of breath. She states GI symptoms have improved; occasional nausea, loose stools. Good appetite; she is tolerating foods. She endorses weakness, but improving. She is ambulating more. No recent falls. She does endorse some strain with her family.   History obtained from review of EMR, discussion with primary team, and interview with family, facility staff/caregiver and/or Ms. Duchene.  I reviewed available labs, medications, imaging, studies and related documents from the EMR.  Records reviewed and summarized above.   ROS  A 10-Point ROS is negative, except for the pertinent positives/negatives detailed per the HPI.   Physical Exam: Constitutional: NAD General: frail appearing EYES: anicteric sclera, lids intact, no discharge  ENMT: intact hearing, oral mucous membranes moist CV: S1S2, RRR, no LE edema, left upper arm +  bruit, thrill Pulmonary: LCTA, no increased work of breathing, no cough, room air Abdomen: normo-active BS + 4 quadrants, soft and non tender GU: deferred MSK: moves all extremities, ambulatory Skin: warm and dry, no rashes or wounds on visible skin Neuro: + generalized weakness,  no  cognitive impairment Psych: non-anxious affect, A and O x 3 Hem/lymph/immuno: no widespread bruising   Thank you for the opportunity to participate in the care of Ms. Barfield. Please call our office at [phone] if we can be of additional assistance.   Ezekiel Slocumb, NP   COVID-19 PATIENT SCREENING TOOL Asked and negative response unless otherwise noted:   Have you had symptoms of covid, tested positive or been in contact with someone with symptoms/positive test in the past 5-10 days? No

## 2022-03-26 NOTE — H&P (Signed)
[facility] Admission History & Physical  MRN : [id number]  Katie [name] is a 66 y.o. (May 01, 1956) female who presents with chief complaint of No chief complaint on file. Marland Kitchen  History of Present Illness: I am asked to evaluate the patient by the dialysis center. The patient was sent here because they have a nonfunctioning tunneled catheter and a functioning left arm AVG.  The patient reports they're not been any problems with any of their dialysis runs. They are reporting good flows with good parameters at dialysis.   Patient denies pain or tenderness overlying the access.  There is no pain with dialysis.  The patient denies hand pain or finger pain consistent with steal syndrome.  No fevers or chills while on dialysis.    No current facility-administered medications for this encounter.    Past Medical History:  Diagnosis Date   Hypertension    Renal disorder     Past Surgical History:  Procedure Laterality Date   A/V SHUNT INTERVENTION Left 12/21/2021   Procedure: A/V SHUNT INTERVENTION;  Surgeon: Algernon Huxley, MD;  Location: [facility];  Service: Cardiovascular;  Laterality: Left;   APPENDECTOMY     AV FISTULA PLACEMENT Left 03/16/2021   Procedure: INSERTION OF ARTERIOVENOUS (AV) GORE-TEX GRAFT ARM;  Surgeon: Algernon Huxley, MD;  Location: [facility];  Service: Vascular;  Laterality: Left;   DIALYSIS/PERMA CATHETER INSERTION N/A 05/16/2020   Procedure: DIALYSIS/PERMA CATHETER INSERTION;  Surgeon: Algernon Huxley, MD;  Location: [facility];  Service: Cardiovascular;  Laterality: N/A;   DIALYSIS/PERMA CATHETER REPAIR N/A 01/22/2022   Procedure: DIALYSIS/PERMA CATHETER INSERT;  Surgeon: Algernon Huxley, MD;  Location: [facility];  Service: Cardiovascular;  Laterality: N/A;   MANDIBLE SURGERY     RENAL BIOPSY Right    RIGHT HEART CATH N/A 05/10/2020   Procedure: RIGHT HEART CATH;  Surgeon: Nelva Bush, MD;  Location: [facility];   Service: Cardiovascular;  Laterality: N/A;   TEMPORARY DIALYSIS CATHETER N/A 05/11/2020   Procedure: TEMPORARY DIALYSIS CATHETER;  Surgeon: Algernon Huxley, MD;  Location: [facility];  Service: Cardiovascular;  Laterality: N/A;    Social History   Tobacco Use   Smoking status: Former    Types: Cigarettes   Smokeless tobacco: Never  Vaping Use   Vaping Use: Never used  Substance Use Topics   Alcohol use: Not Currently   Drug use: Never    Family History  Problem Relation Age of Onset   Heart attack Mother    Asthma Mother    Uterine cancer Mother    Skin cancer Father    Prostate cancer Father    CAD Father    Parkinson's disease Father    Lung cancer Brother     No family history of bleeding or clotting disorders, autoimmune disease or porphyria  Allergies  Allergen Reactions   Penicillins Nausea And Vomiting    Pt states "only pills" make her vomit Other reaction(s): Nausea/Vomit     REVIEW OF SYSTEMS (Negative unless checked)  Constitutional: [] Weight loss  [] Fever  [] Chills Cardiac: [] Chest pain   [] Chest pressure   [] Palpitations   [] Shortness of breath when laying flat   [] Shortness of breath at rest   [x] Shortness of breath with exertion. Vascular:  [] Pain in legs with walking   [] Pain in legs at rest   [] Pain in legs when laying flat   [] Claudication   [] Pain in feet when walking  []   Pain in feet at rest  [] Pain in feet when laying flat   [] History of DVT   [] Phlebitis   [] Swelling in legs   [] Varicose veins   [] Non-healing ulcers Pulmonary:   [] Uses home oxygen   [] Productive cough   [] Hemoptysis   [] Wheeze  [] COPD   [] Asthma Neurologic:  [] Dizziness  [] Blackouts   [] Seizures   [] History of stroke   [] History of TIA  [] Aphasia   [] Temporary blindness   [] Dysphagia   [] Weakness or numbness in arms   [] Weakness or numbness in legs Musculoskeletal:  [] Arthritis   [] Joint swelling   [] Joint pain   [] Low back pain Hematologic:  [] Easy bruising  [] Easy bleeding    [] Hypercoagulable state   [x] Anemic  [] Hepatitis Gastrointestinal:  [] Blood in stool   [] Vomiting blood  [] Gastroesophageal reflux/heartburn   [] Difficulty swallowing. Genitourinary:  [x] Chronic kidney disease   [] Difficult urination  [] Frequent urination  [] Burning with urination   [] Blood in urine Skin:  [] Rashes   [] Ulcers   [] Wounds Psychological:  [] History of anxiety   []  History of major depression.  Physical Examination  There were no vitals filed for this visit. There is no height or weight on file to calculate BMI. Gen: WD/WN, NAD Head: [place]/AT, No temporalis wasting.  Ear/Nose/Throat: Hearing grossly intact, nares w/o erythema or drainage, oropharynx w/o Erythema/Exudate,  Eyes: Conjunctiva clear, sclera non-icteric Neck: Trachea midline.  No JVD.  Pulmonary:  Good air movement, respirations not labored, no use of accessory muscles.  Cardiac: RRR, normal S1, S2. Vascular: thrill over left arm AVG Vessel Right Left  Radial Palpable Palpable   Musculoskeletal: M/S 5/5 throughout.  Extremities without ischemic changes.  No deformity or atrophy.  Neurologic: Sensation grossly intact in extremities.  Symmetrical.  Speech is fluent. Motor exam as listed above. Psychiatric: Judgment intact, Mood & affect appropriate for pt's clinical situation. Dermatologic: No rashes or ulcers noted.  No cellulitis or open wounds.    CBC Lab Results  Component Value Date   WBC 6.4 12/22/2021   HGB 9.2 (L) 12/22/2021   HCT 29.7 (L) 12/22/2021   MCV 97.4 12/22/2021   PLT 188 12/22/2021    BMET    Component Value Date/Time   NA 136 12/22/2021 0442   K 4.1 12/22/2021 0442   CL 101 12/22/2021 0442   CO2 30 12/22/2021 0442   GLUCOSE 87 12/22/2021 0442   BUN 26 (H) 12/22/2021 0442   CREATININE 3.39 (H) 12/22/2021 0442   CALCIUM 8.8 (L) 12/22/2021 0442   GFRNONAA 15 (L) 12/22/2021 0442   GFRAA  01/19/2021 0833    SPECIMEN HEMOLYZED. HEMOLYSIS MAY AFFECT INTEGRITY OF RESULTS.   CrCl  cannot be calculated (Patient's most recent lab result is older than the maximum 21 days allowed.).  COAG Lab Results  Component Value Date   INR 1.1 12/11/2021   INR 1.0 03/16/2021    Radiology No results found.  Assessment/Plan 1.  Complication dialysis device:  Patient's Tunneled catheter is not being used. The patient has an extremity access that is functioning well. Therefore, the patient will undergo removal of the tunneled catheter under local anesthesia.  The risks and benefits were described to the patient.  All questions were answered.  The patient agrees to proceed with angiography and intervention. Potassium will be drawn to ensure that it is an appropriate level prior to performing intervention. 2.  End-stage renal disease requiring hemodialysis:  Patient will continue dialysis therapy without further interruption 3.  Hypertension:  Patient will  continue medical management; nephrology is following no changes in oral medications.     Leotis Pain, MD  03/26/2022 1:30 PM

## 2022-03-26 NOTE — Op Note (Signed)
Operative Note     Preoperative diagnosis:   1. ESRD with functional permanent access  Postoperative diagnosis:  1. ESRD with functional permanent access  Procedure:  Removal of right jugular Permcath  Surgeon:  Leotis Pain, MD  Assistant: Annalee Genta, NP  Anesthesia:  Local  EBL:  Minimal  Indication for the Procedure:  The patient has a functional permanent dialysis access and no longer needs their permcath.  This can be removed.  Risks and benefits are discussed and informed consent is obtained.  Description of the Procedure:  The patient's right neck, chest and existing catheter were sterilely prepped and draped. The area around the catheter was anesthetized copiously with 1% lidocaine. The catheter was dissected out with curved hemostats until the cuff was freed from the surrounding fibrous sheath. The fiber sheath was transected, and the catheter was then removed in its entirety using gentle traction. Pressure was held and sterile dressings were placed. The patient tolerated the procedure well and was taken to the recovery room in stable condition.     Leotis Pain  03/26/2022, 2:36 PM This note was created with Dragon Medical transcription system. Any errors in dictation are purely unintentional.

## 2022-03-27 ENCOUNTER — Encounter: Payer: Self-pay | Admitting: Vascular Surgery

## 2022-05-17 ENCOUNTER — Non-Acute Institutional Stay: Payer: Medicaid Other | Admitting: Hospice

## 2022-05-17 DIAGNOSIS — N186 End stage renal disease: Secondary | ICD-10-CM

## 2022-05-17 DIAGNOSIS — Z515 Encounter for palliative care: Secondary | ICD-10-CM

## 2022-05-17 DIAGNOSIS — R531 Weakness: Secondary | ICD-10-CM

## 2022-05-17 DIAGNOSIS — R110 Nausea: Secondary | ICD-10-CM

## 2022-05-17 NOTE — Progress Notes (Signed)
    [facility] Consult Note Telephone: [phone]  Fax: [phone]   PATIENT NAME: Katie [name] [address] [place] [address]   [phone] (home)  DOB: 06/05/1955 MRN: [id number] PRIMARY CARE PROVIDER:    Lajuana Ripple, NP   REFERRING PROVIDER:   Lajuana Ripple, NP   RESPONSIBLE PARTY:    Contact Information   None on File      I met face to face with patient in the facility. Palliative Care was asked to follow this patient by consultation to address advance care planning and complex medical decision making. This is a follow up visit.                                   ASSESSMENT AND PLAN / RECOMMENDATIONS:   Goals of Care: Goals include to maximize quality of life and symptom management.  CODE STATUS: DNR  Symptom Management/Plan:  ESRD-continues on hemodialysis Tuesday-Thursday-Saturday. She has been tolerating sessions.  Education on need to adhere to dialysis schedules. Weakness-, endorses weakness after dialysis which resolves as she rests.  Overall weakness improving per nursing staff; she is ambulating more. No falls.  Fall precautions.   Nausea-occasional nausea; continue PRN ondansetron.   Follow up Palliative Care Visit: Palliative care will continue to follow for complex medical decision making, advance care planning, and clarification of goals. Return in 8 weeks or prn.  PPS: 50%  HOSPICE ELIGIBILITY/DIAGNOSIS: TBD  Chief Complaint: Palliative Medicine follow up visit.   HISTORY OF PRESENT ILLNESS:  Katie [name] is a 67 y.o. year old female  with ESRD on hemodialysis, hypertension, T2DM, anemia, hypertensive heart and kidney disease, chronic diastolic HF, depression.  Patient endorsed weakness which she stated is related to when she goes for dialysis but she recovers by the next day.  History obtained from review of EMR, discussion with primary team, and interview with family, facility  staff/caregiver and/or Katie [name]. A 10-Point ROS is negative, except for the pertinent positives/negatives detailed in the HPI.  I reviewed available labs, medications, imaging, studies and related documents from the EMR.  Records reviewed and summarized above.   I spent 45 minutes providing this consultation; this includes time spent with patient/family, chart review and documentation. More than 50% of the time in this consultation was spent on counseling and coordinating communication   Thank you for the opportunity to participate in the care of Katie [name]. Please call our office at [phone] if we can be of additional assistance.   Note: Portions of this note were generated with Lobbyist. Dictation errors may occur despite best attempts at proofreading.   Teodoro Spray, NP

## 2022-06-09 ENCOUNTER — Emergency Department: Payer: Medicaid Other

## 2022-06-09 ENCOUNTER — Other Ambulatory Visit: Payer: Self-pay

## 2022-06-09 ENCOUNTER — Emergency Department
Admission: EM | Admit: 2022-06-09 | Discharge: 2022-06-09 | Disposition: A | Discharge status: Skilled Nursing Facility | Payer: Medicaid Other | Attending: Emergency Medicine | Admitting: Emergency Medicine

## 2022-06-09 DIAGNOSIS — N186 End stage renal disease: Secondary | ICD-10-CM | POA: Diagnosis not present

## 2022-06-09 DIAGNOSIS — R112 Nausea with vomiting, unspecified: Secondary | ICD-10-CM | POA: Diagnosis present

## 2022-06-09 DIAGNOSIS — Z992 Dependence on renal dialysis: Secondary | ICD-10-CM | POA: Insufficient documentation

## 2022-06-09 DIAGNOSIS — F17200 Nicotine dependence, unspecified, uncomplicated: Secondary | ICD-10-CM | POA: Diagnosis not present

## 2022-06-09 DIAGNOSIS — R197 Diarrhea, unspecified: Secondary | ICD-10-CM | POA: Diagnosis not present

## 2022-06-09 DIAGNOSIS — I5032 Chronic diastolic (congestive) heart failure: Secondary | ICD-10-CM | POA: Diagnosis not present

## 2022-06-09 LAB — CBC WITH DIFFERENTIAL/PLATELET
Abs Immature Granulocytes: 0.02 10*3/uL (ref 0.00–0.07)
Basophils Absolute: 0 10*3/uL (ref 0.0–0.1)
Basophils Relative: 1 %
Eosinophils Absolute: 0 10*3/uL (ref 0.0–0.5)
Eosinophils Relative: 1 %
HCT: 38.7 % (ref 36.0–46.0)
Hemoglobin: 11.9 g/dL — ABNORMAL LOW (ref 12.0–15.0)
Immature Granulocytes: 0 %
Lymphocytes Relative: 17 %
Lymphs Abs: 0.9 10*3/uL (ref 0.7–4.0)
MCH: 30.8 pg (ref 26.0–34.0)
MCHC: 30.7 g/dL (ref 30.0–36.0)
MCV: 100.3 fL — ABNORMAL HIGH (ref 80.0–100.0)
Monocytes Absolute: 0.5 10*3/uL (ref 0.1–1.0)
Monocytes Relative: 9 %
Neutro Abs: 4.2 10*3/uL (ref 1.7–7.7)
Neutrophils Relative %: 72 %
Platelets: 231 10*3/uL (ref 150–400)
RBC: 3.86 MIL/uL — ABNORMAL LOW (ref 3.87–5.11)
RDW: 15.1 % (ref 11.5–15.5)
WBC: 5.6 10*3/uL (ref 4.0–10.5)
nRBC: 0 % (ref 0.0–0.2)

## 2022-06-09 LAB — COMPREHENSIVE METABOLIC PANEL
ALT: 10 U/L (ref 0–44)
AST: 14 U/L — ABNORMAL LOW (ref 15–41)
Albumin: 3.4 g/dL — ABNORMAL LOW (ref 3.5–5.0)
Alkaline Phosphatase: 98 U/L (ref 38–126)
Anion gap: 14 (ref 5–15)
BUN: 44 mg/dL — ABNORMAL HIGH (ref 8–23)
CO2: 20 mmol/L — ABNORMAL LOW (ref 22–32)
Calcium: 8.5 mg/dL — ABNORMAL LOW (ref 8.9–10.3)
Chloride: 100 mmol/L (ref 98–111)
Creatinine, Ser: 6.75 mg/dL — ABNORMAL HIGH (ref 0.44–1.00)
GFR, Estimated: 6 mL/min — ABNORMAL LOW (ref >60)
Glucose, Bld: 88 mg/dL (ref 70–99)
Potassium: 3.4 mmol/L — ABNORMAL LOW (ref 3.5–5.1)
Sodium: 134 mmol/L — ABNORMAL LOW (ref 135–145)
Total Bilirubin: 0.7 mg/dL (ref 0.3–1.2)
Total Protein: 7.1 g/dL (ref 6.5–8.1)

## 2022-06-09 LAB — LACTIC ACID, PLASMA: Lactic Acid, Venous: 1 mmol/L (ref 0.5–1.9)

## 2022-06-09 LAB — LIPASE, BLOOD: Lipase: 33 U/L (ref 11–51)

## 2022-06-09 MED ORDER — SODIUM CHLORIDE 0.9 % IV BOLUS
500.0000 mL | Freq: Once | INTRAVENOUS | Status: AC
  Administered 2022-06-09: 500 mL via INTRAVENOUS

## 2022-06-09 MED ORDER — OXYCODONE HCL 5 MG PO TABS
5.0000 mg | ORAL_TABLET | Freq: Once | ORAL | Status: AC
  Administered 2022-06-09: 5 mg via ORAL
  Filled 2022-06-09: qty 1

## 2022-06-09 NOTE — ED Triage Notes (Signed)
Patient BIB EMS from dialysis for evaluation of hypotension.  Patient reports nausea, vomiting, and diarrhea x 1 week.  Had a fall without injury on Thursday.  Last dialysis was Tuesday.   No reports of fevers.  Normally Tues/Thurs/Saturday dialysis.

## 2022-06-09 NOTE — ED Notes (Signed)
Report called to [place] at [facility].

## 2022-06-09 NOTE — Discharge Instructions (Signed)
Find Pedialyte or similar electrolyte rehydration formulas at your local pharmacy to drink while you are having vomiting and diarrhea.  If your diarrhea lasts greater than 10 days, you have severe abdominal pain, fever, or blood in the stools then call your doctor right away for an appointment or come back to the emergency department.  Go to your dialysis session as scheduled.  If you develop shortness of breath, come back to the emergency department as you may need emergency dialysis.  Thank you for choosing [facility] for your health care today!  Please see your primary doctor this week for a follow up appointment.   Sometimes, in the early stages of certain disease courses it is difficult to detect in the emergency department evaluation -- so, it is important that you continue to monitor your symptoms and call your doctor right away or return to the emergency department if you develop any new or worsening symptoms.  Please go to the following website to schedule new (and existing) patient appointments:   [email or web address]  If you do not have a primary doctor try calling the following clinics to establish care:  If you have insurance:  [facility] [id number] [name] [address]   [facility]  [phone] [place] [address]   If you do not have insurance:  [facility]  [phone] [address], [name] [address]   The following is another list of primary care offices in the area who are accepting new patients at this time.  Please reach out to one of them directly and let them know you would like to schedule an appointment to follow up on an Emergency Department visit, and/or to establish a new primary care provider (PCP).  There are likely other primary care clinics in the are who are accepting new patients, but this is an excellent place to start:  [facility] physician: Dr Lavon Paganini [address] [place], [place] [address] [phone]  [facility] Lead Physician: Dr Steele Sizer [address], [place], [place] [address] [phone]  [facility] Physician: Dr Park Liter [address] [place], [place] [address] [phone]  [facility] Lead Physician: Dr Dewaine Oats [place], [place], [place] [address] [phone]  [facility] Physician: Dr Halina Maidens [address] [place], [place] [address] [phone]   It was my pleasure to care for you today.   [place] Jacelyn Grip, MD

## 2022-06-09 NOTE — ED Notes (Signed)
Pt incontinent of bowel. Pt cleaned and placed in new brief.

## 2022-06-09 NOTE — ED Notes (Signed)
Pt had loose bowel movement. Pt cleaned and placed in new brief.

## 2022-06-09 NOTE — ED Provider Notes (Signed)
[facility] Provider Note    Event Date/Time   First MD Initiated Contact with Patient 06/09/22 [phone]     (approximate)   History   Hypotension   HPI  Katie [name] is a 67 y.o. 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  Some cramping abdominal pain associated.  No bleeding.  No urinary symptoms.  No fever or chills.  She missed her last 2 dialysis appointments, is due for 1 today was at HD and sent to ED for low BP; not reported how low.  Her last dialysis was Tuesday.  She has a baseline smoker's cough.  No other acute respiratory complaints.  No chest pain.  Stools have been watery and profuse.  No recent travels hospitalizations or antibiotics.  Vomited a couple times this week.  Nauseated.   External Medical Documents Reviewed: Emergency department visit dated August 2023 for hypotension after her dialysis session which spontaneously resolved out intervention      Physical Exam   Triage Vital Signs: ED Triage Vitals  Enc Vitals Group     BP 06/09/22 0715 (!) 108/56     Pulse Rate 06/09/22 0715 80     Resp 06/09/22 0715 16     Temp 06/09/22 0715 97.9 F (36.6 C)     Temp src --      SpO2 06/09/22 0715 100 %     Weight --      Height --      Head Circumference --      Peak Flow --      Pain Score 06/09/22 0716 6     Pain Loc --      Pain Edu? --      Excl. in [place]? --     Most recent vital signs: Vitals:   06/09/22 1104 06/09/22 1109  BP: (!) 121/96   Pulse: 69   Resp: 13   Temp:  97.7 F (36.5 C)  SpO2: 100%     General: Awake, no distress.  CV:  Good peripheral perfusion.  Resp:  Normal effort.  Abd:  No distention.  Other:  Awake alert oriented comfortable nontoxic-appearing slightly low blood pressure 108/56 afebrile  abdomen mildly tender to deep palpation all quadrants with no rigidity or guarding.  Mucous membranes appear slightly dry.  Lungs are clear.   ED Results / Procedures / Treatments   Labs (all labs ordered are listed, but only abnormal results are displayed) Labs Reviewed  CBC WITH DIFFERENTIAL/PLATELET - Abnormal; Notable for the following components:      Result Value   RBC 3.86 (*)    Hemoglobin 11.9 (*)    MCV 100.3 (*)    All other components within normal limits  COMPREHENSIVE METABOLIC PANEL - Abnormal; Notable for the following components:   Sodium 134 (*)    Potassium 3.4 (*)    CO2 20 (*)    BUN 44 (*)    Creatinine, Ser 6.75 (*)    Calcium 8.5 (*)    Albumin 3.4 (*)    AST 14 (*)    GFR, Estimated 6 (*)    All other components within normal limits  CULTURE, BLOOD (ROUTINE X 2)  CULTURE, BLOOD (ROUTINE X 2)  LACTIC ACID, PLASMA  LIPASE, BLOOD  URINALYSIS, ROUTINE W REFLEX MICROSCOPIC  LACTIC ACID,  PLASMA     I ordered and reviewed the above labs they are notable for potassium is 3.4, creatinine 6.7  EKG  ED ECG REPORT I, Lucillie Garfinkel, the attending physician, personally viewed and interpreted this ECG.   Date: 06/09/2022  EKG Time: 0700  Rate: 65  Rhythm: nsr  Axis: nl  Intervals:1st degree av block  ST&T Change: no acute ischemic changes    RADIOLOGY I independently reviewed and interpreted chest x-ray and see no obvious consolidation or pneumothorax   PROCEDURES:  Critical Care performed: No  Procedures   MEDICATIONS ORDERED IN ED: Medications  sodium chloride 0.9 % bolus 500 mL (0 mLs Intravenous Stopped 06/09/22 1017)  oxyCODONE (Oxy IR/ROXICODONE) immediate release tablet 5 mg (5 mg Oral Given 06/09/22 0865)    IMPRESSION / MDM / ASSESSMENT AND PLAN / ED COURSE  I reviewed the triage vital signs and the nursing notes.                                Patient's presentation is most consistent with acute presentation with potential  threat to life or bodily function.  Differential diagnosis includes, but is not limited to, dehydration electrolyte disturbance in the setting of GI losses and missed dialysis, infection, intra-abdominal infection   The patient is on the cardiac monitor to evaluate for evidence of arrhythmia and/or significant heart rate changes.  MDM: This is a patient with profuse diarrhea over the course of 1 week and missed dialysis need to assess basic labs for electrolyte derangements, dehydration, infectious etiologies.  Will do a dry scan of the abdomen and pelvis given tenderness to rule out surgical pathologies.  She appears slightly dehydrated given her profuse diarrhea will start with 500 cc normal saline and closely monitor fluid status given her end-stage renal disease and history of heart failure.  I considered hospitalization for admission or observation when stability in the emergency department and now normotensive on multiple rechecks after receiving 500 cc of IV crystalloid, patient with labs not necessitating emergent dialysis and a CT scan of the abdomen pelvis without surgical emergent pathologies, I think outpatient monitoring and follow-up is most appropriate at this time.        FINAL CLINICAL IMPRESSION(S) / ED DIAGNOSES   Final diagnoses:  Nausea vomiting and diarrhea     Rx / DC Orders   ED Discharge Orders     None        Note:  This document was prepared using Dragon voice recognition software and may include unintentional dictation errors.    Lucillie Garfinkel, MD 06/09/22 [phone]

## 2022-06-10 ENCOUNTER — Telehealth: Payer: Self-pay | Admitting: Emergency Medicine

## 2022-06-10 LAB — BLOOD CULTURE ID PANEL (REFLEXED) - BCID2

## 2022-06-10 NOTE — Telephone Encounter (Signed)
This Rn received phone call for positive blood culture bottle 1 bottle out of 4. Per Lab blood culture bottle + for staph epi, mec a resistant. This RN conferred with EDP Archie Balboa, per Dr. Archie Balboa call [facility] and instruct staff to monitor for fevers and have patient return to ED for fevers however is possible that positive blood culture is a contaminant. This RN spoke with Linus Orn at [facility] regarding EDP recommendations, Linus Orn states understanding to this RN and will notify next shift to continue to monitor for fevers.

## 2022-06-12 ENCOUNTER — Inpatient Hospital Stay
Admission: EM | Admit: 2022-06-12 | Discharge: 2022-06-18 | DRG: 312 | Disposition: A | Discharge status: Skilled Nursing Facility | Payer: Medicaid Other | Source: Skilled Nursing Facility | Attending: Student | Admitting: Student

## 2022-06-12 ENCOUNTER — Emergency Department: Payer: Medicaid Other

## 2022-06-12 ENCOUNTER — Other Ambulatory Visit: Payer: Self-pay

## 2022-06-12 DIAGNOSIS — Z820 Family history of epilepsy and other diseases of the nervous system: Secondary | ICD-10-CM

## 2022-06-12 DIAGNOSIS — N186 End stage renal disease: Secondary | ICD-10-CM | POA: Diagnosis not present

## 2022-06-12 DIAGNOSIS — Z825 Family history of asthma and other chronic lower respiratory diseases: Secondary | ICD-10-CM

## 2022-06-12 DIAGNOSIS — Z79899 Other long term (current) drug therapy: Secondary | ICD-10-CM

## 2022-06-12 DIAGNOSIS — Z1152 Encounter for screening for COVID-19: Secondary | ICD-10-CM

## 2022-06-12 DIAGNOSIS — Z880 Allergy status to penicillin: Secondary | ICD-10-CM

## 2022-06-12 DIAGNOSIS — Z8249 Family history of ischemic heart disease and other diseases of the circulatory system: Secondary | ICD-10-CM

## 2022-06-12 DIAGNOSIS — R197 Diarrhea, unspecified: Secondary | ICD-10-CM

## 2022-06-12 DIAGNOSIS — R1110 Vomiting, unspecified: Secondary | ICD-10-CM

## 2022-06-12 DIAGNOSIS — R55 Syncope and collapse: Secondary | ICD-10-CM

## 2022-06-12 DIAGNOSIS — Z66 Do not resuscitate: Secondary | ICD-10-CM | POA: Diagnosis present

## 2022-06-12 DIAGNOSIS — I10 Essential (primary) hypertension: Secondary | ICD-10-CM | POA: Diagnosis present

## 2022-06-12 DIAGNOSIS — A0839 Other viral enteritis: Secondary | ICD-10-CM | POA: Diagnosis present

## 2022-06-12 DIAGNOSIS — R0902 Hypoxemia: Secondary | ICD-10-CM | POA: Diagnosis not present

## 2022-06-12 DIAGNOSIS — I951 Orthostatic hypotension: Secondary | ICD-10-CM | POA: Diagnosis present

## 2022-06-12 DIAGNOSIS — I350 Nonrheumatic aortic (valve) stenosis: Secondary | ICD-10-CM | POA: Diagnosis present

## 2022-06-12 DIAGNOSIS — E1122 Type 2 diabetes mellitus with diabetic chronic kidney disease: Secondary | ICD-10-CM | POA: Diagnosis present

## 2022-06-12 DIAGNOSIS — Z992 Dependence on renal dialysis: Secondary | ICD-10-CM

## 2022-06-12 DIAGNOSIS — E861 Hypovolemia: Secondary | ICD-10-CM | POA: Diagnosis present

## 2022-06-12 DIAGNOSIS — D631 Anemia in chronic kidney disease: Secondary | ICD-10-CM | POA: Diagnosis present

## 2022-06-12 DIAGNOSIS — E860 Dehydration: Secondary | ICD-10-CM | POA: Diagnosis present

## 2022-06-12 DIAGNOSIS — Z87891 Personal history of nicotine dependence: Secondary | ICD-10-CM

## 2022-06-12 DIAGNOSIS — I132 Hypertensive heart and chronic kidney disease with heart failure and with stage 5 chronic kidney disease, or end stage renal disease: Secondary | ICD-10-CM | POA: Diagnosis present

## 2022-06-12 DIAGNOSIS — E119 Type 2 diabetes mellitus without complications: Secondary | ICD-10-CM

## 2022-06-12 DIAGNOSIS — I5032 Chronic diastolic (congestive) heart failure: Secondary | ICD-10-CM | POA: Diagnosis present

## 2022-06-12 LAB — CBC WITH DIFFERENTIAL/PLATELET
Abs Immature Granulocytes: 0.04 10*3/uL (ref 0.00–0.07)
Basophils Absolute: 0 10*3/uL (ref 0.0–0.1)
Basophils Relative: 1 %
Eosinophils Absolute: 0.1 10*3/uL (ref 0.0–0.5)
Eosinophils Relative: 1 %
HCT: 39.9 % (ref 36.0–46.0)
Hemoglobin: 12.3 g/dL (ref 12.0–15.0)
Immature Granulocytes: 1 %
Lymphocytes Relative: 9 %
Lymphs Abs: 0.8 10*3/uL (ref 0.7–4.0)
MCH: 30.8 pg (ref 26.0–34.0)
MCHC: 30.8 g/dL (ref 30.0–36.0)
MCV: 99.8 fL (ref 80.0–100.0)
Monocytes Absolute: 0.5 10*3/uL (ref 0.1–1.0)
Monocytes Relative: 6 %
Neutro Abs: 7.1 10*3/uL (ref 1.7–7.7)
Neutrophils Relative %: 82 %
Platelets: 233 10*3/uL (ref 150–400)
RBC: 4 MIL/uL (ref 3.87–5.11)
RDW: 15.3 % (ref 11.5–15.5)
WBC: 8.5 10*3/uL (ref 4.0–10.5)
nRBC: 0 % (ref 0.0–0.2)

## 2022-06-12 LAB — TROPONIN I (HIGH SENSITIVITY)
Troponin I (High Sensitivity): 24 ng/L — ABNORMAL HIGH (ref <18)
Troponin I (High Sensitivity): 21 ng/L — ABNORMAL HIGH (ref <18)

## 2022-06-12 LAB — CULTURE, BLOOD (ROUTINE X 2): Special Requests: ADEQUATE

## 2022-06-12 LAB — RESP PANEL BY RT-PCR (RSV, FLU A&B, COVID)  RVPGX2
Influenza A by PCR: NEGATIVE
Influenza B by PCR: NEGATIVE
Resp Syncytial Virus by PCR: NEGATIVE
SARS Coronavirus 2 by RT PCR: NEGATIVE

## 2022-06-12 LAB — COMPREHENSIVE METABOLIC PANEL
ALT: 9 U/L (ref 0–44)
AST: 11 U/L — ABNORMAL LOW (ref 15–41)
Albumin: 3.5 g/dL (ref 3.5–5.0)
Alkaline Phosphatase: 104 U/L (ref 38–126)
Anion gap: 14 (ref 5–15)
BUN: 44 mg/dL — ABNORMAL HIGH (ref 8–23)
CO2: 20 mmol/L — ABNORMAL LOW (ref 22–32)
Calcium: 8.1 mg/dL — ABNORMAL LOW (ref 8.9–10.3)
Chloride: 100 mmol/L (ref 98–111)
Creatinine, Ser: 6.56 mg/dL — ABNORMAL HIGH (ref 0.44–1.00)
GFR, Estimated: 7 mL/min — ABNORMAL LOW (ref >60)
Glucose, Bld: 73 mg/dL (ref 70–99)
Potassium: 3.2 mmol/L — ABNORMAL LOW (ref 3.5–5.1)
Sodium: 134 mmol/L — ABNORMAL LOW (ref 135–145)
Total Bilirubin: 0.9 mg/dL (ref 0.3–1.2)
Total Protein: 7 g/dL (ref 6.5–8.1)

## 2022-06-12 LAB — LACTIC ACID, PLASMA
Lactic Acid, Venous: 1 mmol/L (ref 0.5–1.9)
Lactic Acid, Venous: 0.8 mmol/L (ref 0.5–1.9)

## 2022-06-12 LAB — LIPASE, BLOOD: Lipase: 37 U/L (ref 11–51)

## 2022-06-12 LAB — HEPATITIS B SURFACE ANTIGEN: Hepatitis B Surface Ag: NONREACTIVE

## 2022-06-12 MED ORDER — OXYCODONE-ACETAMINOPHEN 5-325 MG PO TABS: 1.0000 | ORAL_TABLET | Freq: Two times a day (BID) | ORAL | Status: DC | PRN

## 2022-06-12 MED ORDER — DIVALPROEX SODIUM 125 MG PO CSDR
125.0000 mg | DELAYED_RELEASE_CAPSULE | Freq: Every day | ORAL | Status: DC
  Administered 2022-06-12 – 2022-06-17 (×6): 125 mg via ORAL
  Filled 2022-06-12 (×7): qty 1

## 2022-06-12 MED ORDER — OXYCODONE HCL 5 MG PO TABS
5.0000 mg | ORAL_TABLET | Freq: Once | ORAL | Status: DC
  Filled 2022-06-12: qty 1

## 2022-06-12 MED ORDER — SODIUM CHLORIDE 0.9 % IV BOLUS
500.0000 mL | Freq: Once | INTRAVENOUS | Status: AC
  Administered 2022-06-12: 500 mL via INTRAVENOUS

## 2022-06-12 MED ORDER — CHLORHEXIDINE GLUCONATE CLOTH 2 % EX PADS
6.0000 | MEDICATED_PAD | Freq: Every day | CUTANEOUS | Status: DC
  Administered 2022-06-13 – 2022-06-17 (×4): 6 via TOPICAL

## 2022-06-12 MED ORDER — HEPARIN SODIUM (PORCINE) 5000 UNIT/ML IJ SOLN
5000.0000 [IU] | Freq: Three times a day (TID) | INTRAMUSCULAR | Status: DC
  Administered 2022-06-12 – 2022-06-18 (×16): 5000 [IU] via SUBCUTANEOUS
  Filled 2022-06-12 (×17): qty 1

## 2022-06-12 MED ORDER — ACETAMINOPHEN 650 MG RE SUPP: 650.0000 mg | Freq: Four times a day (QID) | RECTAL | Status: DC | PRN

## 2022-06-12 MED ORDER — ONDANSETRON HCL 4 MG PO TABS: 4.0000 mg | ORAL_TABLET | Freq: Four times a day (QID) | ORAL | Status: DC | PRN

## 2022-06-12 MED ORDER — ALBUTEROL SULFATE HFA 108 (90 BASE) MCG/ACT IN AERS: 1.0000 | INHALATION_SPRAY | Freq: Four times a day (QID) | RESPIRATORY_TRACT | Status: DC | PRN

## 2022-06-12 MED ORDER — MIDODRINE HCL 5 MG PO TABS
5.0000 mg | ORAL_TABLET | Freq: Once | ORAL | Status: AC
  Administered 2022-06-12: 5 mg via ORAL
  Filled 2022-06-12: qty 1

## 2022-06-12 MED ORDER — SERTRALINE HCL 50 MG PO TABS
25.0000 mg | ORAL_TABLET | Freq: Every day | ORAL | Status: DC
  Administered 2022-06-13 – 2022-06-18 (×6): 25 mg via ORAL
  Filled 2022-06-12 (×6): qty 1

## 2022-06-12 MED ORDER — ALBUTEROL SULFATE (2.5 MG/3ML) 0.083% IN NEBU: 2.5000 mg | INHALATION_SOLUTION | Freq: Four times a day (QID) | RESPIRATORY_TRACT | Status: DC | PRN

## 2022-06-12 MED ORDER — TRAZODONE HCL 50 MG PO TABS
50.0000 mg | ORAL_TABLET | Freq: Every day | ORAL | Status: DC
  Administered 2022-06-12 – 2022-06-17 (×6): 50 mg via ORAL
  Filled 2022-06-12 (×7): qty 1

## 2022-06-12 MED ORDER — ONDANSETRON HCL 4 MG/2ML IJ SOLN
4.0000 mg | Freq: Four times a day (QID) | INTRAMUSCULAR | Status: DC | PRN
  Administered 2022-06-16: 4 mg via INTRAVENOUS

## 2022-06-12 MED ORDER — HEPARIN SODIUM (PORCINE) 1000 UNIT/ML DIALYSIS: 20.0000 [IU]/kg | INTRAMUSCULAR | Status: DC | PRN

## 2022-06-12 MED ORDER — ACETAMINOPHEN 325 MG PO TABS
650.0000 mg | ORAL_TABLET | Freq: Four times a day (QID) | ORAL | Status: DC | PRN
  Administered 2022-06-13 – 2022-06-17 (×4): 650 mg via ORAL
  Filled 2022-06-12 (×4): qty 2

## 2022-06-12 NOTE — H&P (Signed)
History and Physical    Patient: Katie [name] OHY:[id number] DOB: 02/10/1956 DOA: 06/12/2022 DOS: the patient was seen and examined on 06/12/2022 PCP: [facility]  Patient coming from: Home  Chief Complaint:  Chief Complaint  Patient presents with   Dizziness   Hypotension   HPI: Katie [name] is a 67 y.o. female with medical history significant of ESRD on HD (T, Th, Sa), hypertension, who presents to the ED due to dizziness.  Ms. Fennimore states that for the last 1 week, she has been experiencing chills, abdominal cramps, nausea and vomiting, and diarrhea.  She states she has numerous diarrheal episodes per day but it has been nonbloody and nonmelanotic.  She states she has vomited daily but her last episode was yesterday.  Due to the symptoms, she endorses poor p.o. intake.  In addition, she has missed dialysis for the last 3 sessions due to difficulty with transportation and her current symptoms.  Today, when she went for dialysis, she noticed rapid onset dizziness and near syncope.  She denies loss of consciousness.  Due to this, EMS was called.  She denies any chest pain, shortness of breath, cough, lower extremity edema.   Per chart review, patient is currently on carvedilol and amlodipine for blood pressure control.  ED course: On arrival to the ED, patient was initially hypotensive at 99/57 with heart rate of 63.  She was saturating at 100% on room air.  Orthostatic vital signs were obtained that demonstrated a marked 44 drop in systolic blood pressure with BP of 62/39 when standing.  Patient received 1 L of normal saline.  Despite this, patient continued to be symptomatic.  TRH contacted for admission.  Nephrology consulted.  Review of Systems: As mentioned in the history of present illness. All other systems reviewed and are negative.  Past Medical History:  Diagnosis Date   Hypertension    Renal disorder    Past Surgical History:  Procedure Laterality Date   A/V  SHUNT INTERVENTION Left 12/21/2021   Procedure: A/V SHUNT INTERVENTION;  Surgeon: Algernon Huxley, MD;  Location: [place] [facility];  Service: Cardiovascular;  Laterality: Left;   APPENDECTOMY     AV FISTULA PLACEMENT Left 03/16/2021   Procedure: INSERTION OF ARTERIOVENOUS (AV) GORE-TEX GRAFT ARM;  Surgeon: Algernon Huxley, MD;  Location: [facility];  Service: Vascular;  Laterality: Left;   DIALYSIS/PERMA CATHETER INSERTION N/A 05/16/2020   Procedure: DIALYSIS/PERMA CATHETER INSERTION;  Surgeon: Algernon Huxley, MD;  Location: [place] [facility];  Service: Cardiovascular;  Laterality: N/A;   DIALYSIS/PERMA CATHETER REMOVAL N/A 03/26/2022   Procedure: DIALYSIS/PERMA CATHETER REMOVAL;  Surgeon: Algernon Huxley, MD;  Location: [place] [facility];  Service: Cardiovascular;  Laterality: N/A;   DIALYSIS/PERMA CATHETER REPAIR N/A 01/22/2022   Procedure: DIALYSIS/PERMA CATHETER INSERT;  Surgeon: Algernon Huxley, MD;  Location: [place] [facility];  Service: Cardiovascular;  Laterality: N/A;   MANDIBLE SURGERY     RENAL BIOPSY Right    RIGHT HEART CATH N/A 05/10/2020   Procedure: RIGHT HEART CATH;  Surgeon: Nelva Bush, MD;  Location: [place] [facility];  Service: Cardiovascular;  Laterality: N/A;   TEMPORARY DIALYSIS CATHETER N/A 05/11/2020   Procedure: TEMPORARY DIALYSIS CATHETER;  Surgeon: Algernon Huxley, MD;  Location: [place] [facility];  Service: Cardiovascular;  Laterality: N/A;   Social History:  reports that she has quit smoking. Her smoking use included cigarettes. She has never used smokeless tobacco. She reports that she does not  currently use alcohol. She reports that she does not use drugs.  Allergies  Allergen Reactions   Penicillins Nausea And Vomiting    Pt states "only pills" make her vomit Other reaction(s): Nausea/Vomit    Family History  Problem Relation Age of Onset   Heart attack Mother    Asthma Mother    Uterine cancer Mother    Skin cancer Father    Prostate cancer Father     CAD Father    Parkinson's disease Father    Lung cancer Brother     Prior to Admission medications   Medication Sig Start Date End Date Taking? Authorizing Provider  acetaminophen (TYLENOL) 500 MG tablet Take 500 mg by mouth every 6 (six) hours as needed for moderate pain.    [provider]  amLODipine (NORVASC) 2.5 MG tablet Take 2.5 mg by mouth in the morning and at bedtime. Pt not sure about dose or frequency    [provider]  carvedilol (COREG) 6.25 MG tablet Take 1 tablet (6.25 mg total) by mouth every evening. Patient not taking: Reported on 12/11/2021 11/26/21 12/26/21  Vanna Scotland, MD  citalopram (CELEXA) 10 MG tablet Take 1 tablet (10 mg total) by mouth daily. Patient not taking: Reported on 12/11/2021 11/23/21 12/23/21  Barb Merino, MD  furosemide (LASIX) 40 MG tablet Take 1 tablet (40 mg total) by mouth daily. Patient not taking: Reported on 12/11/2021 11/23/21 12/23/21  Barb Merino, MD  ondansetron (ZOFRAN-ODT) 4 MG disintegrating tablet Take 1 tablet (4 mg total) by mouth every 8 (eight) hours as needed for nausea or vomiting. 11/22/21   Barb Merino, MD  oxyCODONE-acetaminophen (PERCOCET/ROXICET) 5-325 MG tablet Take 1 tablet by mouth every 8 (eight) hours as needed for moderate pain or severe pain. 12/25/21   Ezekiel Slocumb, DO  pantoprazole (PROTONIX) 40 MG tablet Take 1 tablet (40 mg total) by mouth 2 (two) times daily. Patient not taking: Reported on 12/11/2021 11/22/21 12/22/21  Barb Merino, MD  senna-docusate (SENOKOT-S) 8.6-50 MG tablet Take 1 tablet by mouth 2 (two) times daily as needed for mild constipation. Patient not taking: Reported on 01/22/2022 12/25/21   Nicole Kindred A, DO  tiZANidine (ZANAFLEX) 4 MG tablet Take 4 mg by mouth 2 (two) times daily as needed. Patient not taking: Reported on 01/22/2022 06/19/21   [provider]  topiramate (TOPAMAX) 50 MG tablet Take 1 tablet (50 mg total) by mouth 2 (two) times daily as needed  (headache). Patient not taking: Reported on 01/22/2022 03/21/21   Sidney Ace, MD  traZODone (DESYREL) 50 MG tablet Take 1 tablet (50 mg total) by mouth every evening. 07/02/20 08/01/20  Nolberto Hanlon, MD    Physical Exam: Vitals:   06/12/22 1520 06/12/22 1524 06/12/22 1630 06/12/22 1655  BP:   (!) 102/42 121/74  Pulse: 67 67 76 77  Resp:   13 (!) 21  Temp:   98.2 F (36.8 C)   TempSrc:   Oral   SpO2: 100% 100% 99% 100%  Weight:   65.7 kg    Physical Exam Vitals and nursing note reviewed.  Constitutional:      General: She is not in acute distress.    Appearance: She is normal weight. She is not toxic-appearing.  HENT:     Head: Normocephalic and atraumatic.     Mouth/Throat:     Mouth: Mucous membranes are dry.     Pharynx: Oropharynx is clear.  Eyes:     Conjunctiva/sclera: Conjunctivae normal.  Pupils: Pupils are equal, round, and reactive to light.  Cardiovascular:     Rate and Rhythm: Normal rate and regular rhythm.     Heart sounds: No murmur heard.    No gallop.  Pulmonary:     Effort: Pulmonary effort is normal. No respiratory distress.     Breath sounds: Normal breath sounds. No wheezing, rhonchi or rales.  Abdominal:     General: Bowel sounds are normal. There is no distension.     Palpations: Abdomen is soft.     Tenderness: There is abdominal tenderness (Periumbilical and right lower quadrant tenderness to palpation). There is no guarding or rebound.  Musculoskeletal:     Right lower leg: No edema.     Left lower leg: No edema.  Skin:    General: Skin is warm and dry.  Neurological:     General: No focal deficit present.     Mental Status: She is alert and oriented to person, place, and time. Mental status is at baseline.  Psychiatric:        Mood and Affect: Mood normal.        Behavior: Behavior normal.    Data Reviewed: CBC with WBC of 8.5, hemoglobin of 12.3, platelets of 233 CMP with a sodium of 134, potassium 3.2, bicarb 20, BUN 44,  calcium 8.1, AST 11, ALT of 9, GFR of 7. Troponin elevated at 24 with flat trend to 21 Lactic acid within normal limits x 2  EKG personally reviewed.  Sinus rhythm with rate of 66.  Borderline first-degree AV block.  QTc prolonged at 493.  No other ST or T wave changes concerning for acute ischemia.  CT HEAD WO CONTRAST (5MM)  Result Date: 06/12/2022 CLINICAL DATA:  Ataxia, hypotension and dizziness EXAM: CT HEAD WITHOUT CONTRAST CT CERVICAL SPINE WITHOUT CONTRAST TECHNIQUE: Multidetector CT imaging of the head and cervical spine was performed following the standard protocol without intravenous contrast. Multiplanar CT image reconstructions of the cervical spine were also generated. RADIATION DOSE REDUCTION: This exam was performed according to the departmental dose-optimization program which includes automated exposure control, adjustment of the mA and/or kV according to patient size and/or use of iterative reconstruction technique. COMPARISON:  03/07/2021 FINDINGS: CT HEAD FINDINGS Brain: No evidence of acute infarction, hemorrhage, hydrocephalus, extra-axial collection or mass lesion/mass effect. There is mild diffuse low-attenuation within the subcortical and periventricular white matter compatible with chronic microvascular disease. Vascular: No hyperdense vessel or unexpected calcification. Skull: Normal. Negative for fracture or focal lesion. Sinuses/Orbits: Paranasal sinuses and mastoid air cells are clear. Other: None CT CERVICAL SPINE FINDINGS Alignment: Normal slight reversal wall of normal cervical lordosis. No acute posttraumatic malalignment of the cervical spine. Skull base and vertebrae: No acute fracture. No primary bone lesion or focal pathologic process. Soft tissues and spinal canal: No prevertebral fluid or swelling. No visible canal hematoma. Disc levels: There is degenerative disc disease identified the C5-6 level. New Schmorl's node deformity is identified involving the superior  endplates of C6 and C7. Upper chest: Negative. Other: None IMPRESSION: 1. No acute intracranial abnormality. 2. Chronic microvascular disease. 3. No evidence for cervical spine fracture or subluxation. 4. Cervical degenerative disc disease. Electronically Signed   By: Kerby Moors M.D.   On: 06/12/2022 12:15   CT Cervical Spine Wo Contrast  Result Date: 06/12/2022 CLINICAL DATA:  Ataxia, hypotension and dizziness EXAM: CT HEAD WITHOUT CONTRAST CT CERVICAL SPINE WITHOUT CONTRAST TECHNIQUE: Multidetector CT imaging of the head and cervical spine was performed  following the standard protocol without intravenous contrast. Multiplanar CT image reconstructions of the cervical spine were also generated. RADIATION DOSE REDUCTION: This exam was performed according to the departmental dose-optimization program which includes automated exposure control, adjustment of the mA and/or kV according to patient size and/or use of iterative reconstruction technique. COMPARISON:  03/07/2021 FINDINGS: CT HEAD FINDINGS Brain: No evidence of acute infarction, hemorrhage, hydrocephalus, extra-axial collection or mass lesion/mass effect. There is mild diffuse low-attenuation within the subcortical and periventricular white matter compatible with chronic microvascular disease. Vascular: No hyperdense vessel or unexpected calcification. Skull: Normal. Negative for fracture or focal lesion. Sinuses/Orbits: Paranasal sinuses and mastoid air cells are clear. Other: None CT CERVICAL SPINE FINDINGS Alignment: Normal slight reversal wall of normal cervical lordosis. No acute posttraumatic malalignment of the cervical spine. Skull base and vertebrae: No acute fracture. No primary bone lesion or focal pathologic process. Soft tissues and spinal canal: No prevertebral fluid or swelling. No visible canal hematoma. Disc levels: There is degenerative disc disease identified the C5-6 level. New Schmorl's node deformity is identified involving the  superior endplates of C6 and C7. Upper chest: Negative. Other: None IMPRESSION: 1. No acute intracranial abnormality. 2. Chronic microvascular disease. 3. No evidence for cervical spine fracture or subluxation. 4. Cervical degenerative disc disease. Electronically Signed   By: Kerby Moors M.D.   On: 06/12/2022 12:15   CT ABDOMEN PELVIS WO CONTRAST  Result Date: 06/12/2022 CLINICAL DATA:  Abdominal pain, dialysis patient with hypotension and dizziness after 30 minutes of dialysis, diarrhea for the past day, history hypertension and end-stage renal disease EXAM: CT ABDOMEN AND PELVIS WITHOUT CONTRAST TECHNIQUE: Multidetector CT imaging of the abdomen and pelvis was performed following the standard protocol without IV contrast. RADIATION DOSE REDUCTION: This exam was performed according to the departmental dose-optimization program which includes automated exposure control, adjustment of the mA and/or kV according to patient size and/or use of iterative reconstruction technique. COMPARISON:  06/09/2022 FINDINGS: Lower chest: Emphysematous changes with complete atelectasis of LEFT lower lobe and dependent atelectasis of posterior RIGHT lower lobe. Trace pericardial effusion. Hepatobiliary: Dependent calculi within gallbladder. Gallbladder and liver otherwise normal appearance. Pancreas: Atrophic pancreas without mass Spleen: Normal appearance Adrenals/Urinary Tract: Thickening of adrenal glands bilaterally without discrete mass. Renal vascular calcifications. No renal mass or hydronephrosis. Ureters unremarkable. Bladder decompressed. Stomach/Bowel: Appendix not visualized. Sigmoid diverticulosis. Stomach and bowel loops otherwise normal appearance for technique Vascular/Lymphatic: Extensive atherosclerotic calcifications. Aorta normal caliber. No adenopathy. Reproductive: Uterus and adnexa unremarkable Other: No free air or free fluid. No hernia or inflammatory process. Musculoskeletal: Osseous demineralization.  IMPRESSION: Cholelithiasis. Sigmoid diverticulosis without evidence of diverticulitis. Bibasilar atelectasis greater on LEFT with complete atelectasis of LEFT lower lobe. Trace pericardial effusion. No acute intra-abdominal or intrapelvic abnormalities. Electronically Signed   By: Lavonia Dana M.D.   On: 06/12/2022 12:02   DG Chest Portable 1 View  Result Date: 06/12/2022 CLINICAL DATA:  Cough. EXAM: PORTABLE CHEST 1 VIEW COMPARISON:  06/09/2022 FINDINGS: The lungs are clear without focal pneumonia, edema, pneumothorax or pleural effusion. Cardiopericardial silhouette is at upper limits of normal for size. Subsegmental atelectasis noted in the lung bases. Trace blunting of the costophrenic angles bilaterally raises the question of tiny effusions. Telemetry leads overlie the chest. IMPRESSION: Bibasilar atelectasis with possible trace bilateral pleural effusions. Electronically Signed   By: Misty Stanley M.D.   On: 06/12/2022 08:03    There are no new results to review at this time.  Assessment and Plan: * Orthostatic  hypotension Patient is presenting with significantly symptomatic orthostatic hypotension in the setting of a diarrheal illness and ongoing antihypertensive use.  She appears hypovolemic on examination.    - S/p 1 L of NS in the ED - Plan to give 1 L during dialysis today - Midodrine 5 mg once prior to dialysis - Repeat orthostatics tomorrow morning - If remains orthostatic, patient may require daily midodrine - Hold home antihypertensive therapy  Vomiting and diarrhea Patient endorses 1 week of abdominal cramps, vomiting and diarrhea.  CT of the abdomen with no evidence of complications.  Likely a viral illness but will also rule out C. difficile.  - GI panel and C. difficile testing pending - Zofran as needed - IV fluids as noted above - Hold further Imodium until GI panel results  ESRD (end stage renal disease) on dialysis [facility]) - Nephrology consulted; appreciate their  recommendations  Type 2 diabetes mellitus ([place]) Patient is not currently on any antiglycemic agents at home.  -No indication for SSI at this time  Hypertension Patient is on amlodipine and carvedilol at home.  Given that she chronically becomes dizzy during dialysis with an acute exacerbation at this time, I suspect that she may require a higher blood pressure to allow for hemodynamic changes that will occur during dialysis  - Hold home Coreg and amlodipine  Advance Care Planning:   Code Status: DNR.  Patient states that she would not want CPR in the case of cardiac arrest, but would want thing else done otherwise.  Consults: Nephro  Family Communication: No family at bedside  Severity of Illness: The appropriate patient status for this patient is OBSERVATION. Observation status is judged to be reasonable and necessary in order to provide the required intensity of service to ensure the patient's safety. The patient's presenting symptoms, physical exam findings, and initial radiographic and laboratory data in the context of their medical condition is felt to place them at decreased risk for further clinical deterioration. Furthermore, it is anticipated that the patient will be medically stable for discharge from the hospital within 2 midnights of admission.   Author: Jose Persia, MD 06/12/2022 5:19 PM  For on call review [email or web address].

## 2022-06-12 NOTE — ED Notes (Signed)
RN gave pt blanket.

## 2022-06-12 NOTE — Assessment & Plan Note (Addendum)
Patient is presenting with significantly symptomatic orthostatic hypotension in the setting of a diarrheal illness and ongoing antihypertensive use.  She appears hypovolemic on examination.    - S/p 1 L of NS in the ED - Plan to give 1 L during dialysis today - Midodrine 5 mg once prior to dialysis - Repeat orthostatics tomorrow morning - If remains orthostatic, patient may require daily midodrine - Hold home antihypertensive therapy

## 2022-06-12 NOTE — Assessment & Plan Note (Signed)
-  Nephrology consulted; appreciate their recommendations.  Will be dialyzed today 

## 2022-06-12 NOTE — Assessment & Plan Note (Signed)
Patient is on amlodipine and carvedilol at home.  Given that she chronically becomes dizzy during dialysis with an acute exacerbation at this time, I suspect that she may require a higher blood pressure to allow for hemodynamic changes that will occur during dialysis  - Hold home Coreg and amlodipine

## 2022-06-12 NOTE — Assessment & Plan Note (Signed)
Patient is not currently on any antiglycemic agents at home.  -No indication for SSI at this time

## 2022-06-12 NOTE — Progress Notes (Signed)
Pt completed 3 hour HD treatment. Alert, no c/o, vss, report to ED RN. Pt to leave KDU on bedpan. ED RN aware.  Start: 3785 End: 2008 +834ml fluid given w/ HD. No fluid removed. 59.6L BVP Unable to weight patient. Cannot stand. Orthostatic hypotension. No scale on ED stretcher.

## 2022-06-12 NOTE — Progress Notes (Signed)
Pt unable to stand for weight d/t orthostatic hypotension into 60's. No scale ED stretcher. Pt reported pre HD weight from HD outpatient clinic today.

## 2022-06-12 NOTE — Progress Notes (Signed)
[facility], [name] 06/12/22  Subjective:   LOS: 0  Patient known to our practice from previous admissions.  Presents this time for dizziness after 30 minutes of dialysis, hypotension.  Blood pressure 64/37 per EMS.  Also reported diarrhea.  Per report, appetite has been poor at home.  Patient asking for food in the emergency room stating that appetite today seems to have improved. Patient has not been able to get full dialysis treatment for the past 3 attempts.  She got only about 10 minutes on 125, 5 minutes on 127 and 53 minutes on 06/12/2022. At present she does not have any lower extremity edema.  No shortness of breath.  Comfortable on room air.  Objective:  Vital signs in last 24 hours:  Temp:  [97.1 F (36.2 C)-98.2 F (36.8 C)] 98.2 F (36.8 C) (01/30 1630) Pulse Rate:  [62-81] 74 (01/30 1855) Resp:  [10-22] 11 (01/30 1855) BP: (96-137)/(42-87) 126/63 (01/30 1855) SpO2:  [99 %-100 %] 99 % (01/30 1855) Weight:  [65.7 kg] 65.7 kg (01/30 1630)  Weight change:  Filed Weights   06/12/22 1630  Weight: 65.7 kg    Intake/Output:   No intake or output data in the 24 hours ending 06/12/22 1904  Physical Exam: General:  No acute distress, laying in the bed  HEENT  anicteric, moist oral mucous membrane  Pulm/lungs  normal breathing effort, lungs are clear to auscultation  CVS/Heart  regular rhythm, no rub or gallop  Abdomen:   Soft, nontender  Extremities:  No peripheral edema  Neurologic:  Alert, oriented, able to follow commands  Skin:  No acute rashes  Left arm AV fistula.     Basic Metabolic Panel:  Recent Labs  Lab 06/09/22 0849 06/12/22 0733  NA 134* 134*  K 3.4* 3.2*  CL 100 100  CO2 20* 20*  GLUCOSE 88 73  BUN 44* 44*  CREATININE 6.75* 6.56*  CALCIUM 8.5* 8.1*     CBC: Recent Labs  Lab 06/09/22 0849 06/12/22 0733  WBC 5.6 8.5  NEUTROABS 4.2 7.1  HGB 11.9* 12.3  HCT 38.7 39.9  MCV 100.3* 99.8  PLT 231 233       Lab Results  Component Value Date   HEPBSAG NON REACTIVE 12/11/2021   HEPBSAB NON REACTIVE 11/04/2021   HEPBIGM NON REACTIVE 12/19/2021      Microbiology:  Recent Results (from the past 240 hour(s))  Blood Culture (routine x 2)     Status: None (Preliminary result)   Collection Time: 06/09/22  8:48 AM   Specimen: BLOOD  Result Value Ref Range Status   Specimen Description BLOOD BLOOD RIGHT HAND  Final   Special Requests   Final    BOTTLES DRAWN AEROBIC AND ANAEROBIC Blood Culture adequate volume   Culture   Final    NO GROWTH 3 DAYS Performed at [facility], [address] [place], [place] [address]    Report Status PENDING  Incomplete  Blood Culture (routine x 2)     Status: Abnormal   Collection Time: 06/09/22 10:18 AM   Specimen: BLOOD  Result Value Ref Range Status   Specimen Description   Final    BLOOD RIGHT ARM Performed at [facility], [address] [place], [place] [address]    Special Requests   Final    BOTTLES DRAWN AEROBIC AND ANAEROBIC Blood Culture adequate volume Performed at [facility], [address] [place], [place] [address]    Culture  Setup Time   Final    GRAM POSITIVE COCCI IN BOTH AEROBIC AND ANAEROBIC BOTTLES CRITICAL RESULT CALLED TO, READ BACK BY AND VERIFIED WITH: Clent Ridges RN ED @ [phone] 06/10/22 LFD    Culture (A)  Final    STAPHYLOCOCCUS HOMINIS STAPHYLOCOCCUS EPIDERMIDIS THE SIGNIFICANCE OF ISOLATING THIS ORGANISM FROM A SINGLE SET OF BLOOD CULTURES WHEN MULTIPLE SETS ARE DRAWN IS UNCERTAIN. PLEASE NOTIFY THE MICROBIOLOGY DEPARTMENT WITHIN ONE WEEK IF SPECIATION AND SENSITIVITIES ARE REQUIRED. Performed at [facility], [place] [address] [place], [place] [address]    Report Status 06/12/2022 FINAL  Final  Blood Culture ID Panel (Reflexed)     Status: Abnormal   Collection Time: 06/09/22 10:18 AM  Result Value Ref Range Status   Enterococcus faecalis NOT DETECTED NOT DETECTED Final    Enterococcus Faecium NOT DETECTED NOT DETECTED Final   Listeria monocytogenes NOT DETECTED NOT DETECTED Final   Staphylococcus species DETECTED (A) NOT DETECTED Final    Comment: CRITICAL RESULT CALLED TO, READ BACK BY AND VERIFIED WITH: Clent Ridges RN ED @ [phone] 06/10/22 LFD    Staphylococcus aureus (BCID) NOT DETECTED NOT DETECTED Final   Staphylococcus epidermidis DETECTED (A) NOT DETECTED Final    Comment: Methicillin (oxacillin) resistant coagulase negative staphylococcus. Possible blood culture contaminant (unless isolated from more than one blood culture draw or clinical case suggests pathogenicity). No antibiotic treatment is indicated for blood  culture contaminants. CRITICAL RESULT CALLED TO, READ BACK BY AND VERIFIED WITH: Clent Ridges RN ED @ [phone] 06/10/22 LFD    Staphylococcus lugdunensis NOT DETECTED NOT DETECTED Final   Streptococcus species NOT DETECTED NOT DETECTED Final   Streptococcus agalactiae NOT DETECTED NOT DETECTED Final   Streptococcus pneumoniae NOT DETECTED NOT DETECTED Final   Streptococcus pyogenes NOT DETECTED NOT DETECTED Final   A.calcoaceticus-baumannii NOT DETECTED NOT DETECTED Final   Bacteroides fragilis NOT DETECTED NOT DETECTED Final   Enterobacterales NOT DETECTED NOT DETECTED Final   Enterobacter cloacae complex NOT DETECTED NOT DETECTED Final   Escherichia coli NOT DETECTED NOT DETECTED Final   Klebsiella aerogenes NOT DETECTED NOT DETECTED Final   Klebsiella oxytoca NOT DETECTED NOT DETECTED Final   Klebsiella pneumoniae NOT DETECTED NOT DETECTED Final   Proteus species NOT DETECTED NOT DETECTED Final   Salmonella species NOT DETECTED NOT DETECTED Final   Serratia marcescens NOT DETECTED NOT DETECTED Final   Haemophilus influenzae NOT DETECTED NOT DETECTED Final   Neisseria meningitidis NOT DETECTED NOT DETECTED Final   Pseudomonas aeruginosa NOT DETECTED NOT DETECTED Final   Stenotrophomonas maltophilia NOT DETECTED NOT DETECTED Final   Candida  albicans NOT DETECTED NOT DETECTED Final   Candida auris NOT DETECTED NOT DETECTED Final   Candida glabrata NOT DETECTED NOT DETECTED Final   Candida krusei NOT DETECTED NOT DETECTED Final   Candida parapsilosis NOT DETECTED NOT DETECTED Final   Candida tropicalis NOT DETECTED NOT DETECTED Final   Cryptococcus neoformans/gattii NOT DETECTED NOT DETECTED Final   Methicillin resistance mecA/C DETECTED (A) NOT DETECTED Final    Comment: CRITICAL RESULT CALLED TO, READ BACK BY AND VERIFIED WITH: Clent Ridges RN ED @ [phone] 06/10/22 LFD Performed at [facility], [address] [place], [place] [address]   Resp panel by RT-PCR (RSV, Flu A&B, Covid) Anterior Nasal Swab     Status: None   Collection Time: 06/12/22  7:32 AM   Specimen: Anterior Nasal Swab  Result Value Ref Range Status   SARS Coronavirus 2 by RT PCR  NEGATIVE NEGATIVE Final    Comment: (NOTE) SARS-CoV-2 target nucleic acids are NOT DETECTED.  The SARS-CoV-2 RNA is generally detectable in upper respiratory specimens during the acute phase of infection. The lowest concentration of SARS-CoV-2 viral copies this assay can detect is 138 copies/mL. A negative result does not preclude SARS-Cov-2 infection and should not be used as the sole basis for treatment or other patient management decisions. A negative result may occur with  improper specimen collection/handling, submission of specimen other than nasopharyngeal swab, presence of viral mutation(s) within the areas targeted by this assay, and inadequate number of viral copies(<138 copies/mL). A negative result must be combined with clinical observations, patient history, and epidemiological information. The expected result is Negative.  Fact Sheet for Patients:  EntrepreneurPulse.com.au  Fact Sheet for Healthcare Providers:  IncredibleEmployment.be  This test is no t yet approved or cleared by the [place] FDA and  has been  authorized for detection and/or diagnosis of SARS-CoV-2 by FDA under an Emergency Use Authorization (EUA). This EUA will remain  in effect (meaning this test can be used) for the duration of the COVID-19 declaration under Section 564(b)(1) of the Act, 21 U.S.C.section 360bbb-3(b)(1), unless the authorization is terminated  or revoked sooner.       Influenza A by PCR NEGATIVE NEGATIVE Final   Influenza B by PCR NEGATIVE NEGATIVE Final    Comment: (NOTE) The Xpert Xpress SARS-CoV-2/FLU/RSV plus assay is intended as an aid in the diagnosis of influenza from Nasopharyngeal swab specimens and should not be used as a sole basis for treatment. Nasal washings and aspirates are unacceptable for Xpert Xpress SARS-CoV-2/FLU/RSV testing.  Fact Sheet for Patients: EntrepreneurPulse.com.au  Fact Sheet for Healthcare Providers: IncredibleEmployment.be  This test is not yet approved or cleared by the [place] FDA and has been authorized for detection and/or diagnosis of SARS-CoV-2 by FDA under an Emergency Use Authorization (EUA). This EUA will remain in effect (meaning this test can be used) for the duration of the COVID-19 declaration under Section 564(b)(1) of the Act, 21 U.S.C. section 360bbb-3(b)(1), unless the authorization is terminated or revoked.     Resp Syncytial Virus by PCR NEGATIVE NEGATIVE Final    Comment: (NOTE) Fact Sheet for Patients: EntrepreneurPulse.com.au  Fact Sheet for Healthcare Providers: IncredibleEmployment.be  This test is not yet approved or cleared by the [place] FDA and has been authorized for detection and/or diagnosis of SARS-CoV-2 by FDA under an Emergency Use Authorization (EUA). This EUA will remain in effect (meaning this test can be used) for the duration of the COVID-19 declaration under Section 564(b)(1) of the Act, 21 U.S.C. section 360bbb-3(b)(1), unless the  authorization is terminated or revoked.  Performed at [facility], [address] [place], [place] [address]     Coagulation Studies: No results for input(s): "LABPROT", "INR" in the last 72 hours.  Urinalysis: No results for input(s): "COLORURINE", "LABSPEC", "PHURINE", "GLUCOSEU", "HGBUR", "BILIRUBINUR", "KETONESUR", "PROTEINUR", "UROBILINOGEN", "NITRITE", "LEUKOCYTESUR" in the last 72 hours.  Invalid input(s): "APPERANCEUR"    Imaging: CT HEAD WO CONTRAST (5MM)  Result Date: 06/12/2022 CLINICAL DATA:  Ataxia, hypotension and dizziness EXAM: CT HEAD WITHOUT CONTRAST CT CERVICAL SPINE WITHOUT CONTRAST TECHNIQUE: Multidetector CT imaging of the head and cervical spine was performed following the standard protocol without intravenous contrast. Multiplanar CT image reconstructions of the cervical spine were also generated. RADIATION DOSE REDUCTION: This exam was performed according to the departmental dose-optimization program which includes automated exposure control, adjustment of the mA and/or kV  according to patient size and/or use of iterative reconstruction technique. COMPARISON:  03/07/2021 FINDINGS: CT HEAD FINDINGS Brain: No evidence of acute infarction, hemorrhage, hydrocephalus, extra-axial collection or mass lesion/mass effect. There is mild diffuse low-attenuation within the subcortical and periventricular white matter compatible with chronic microvascular disease. Vascular: No hyperdense vessel or unexpected calcification. Skull: Normal. Negative for fracture or focal lesion. Sinuses/Orbits: Paranasal sinuses and mastoid air cells are clear. Other: None CT CERVICAL SPINE FINDINGS Alignment: Normal slight reversal wall of normal cervical lordosis. No acute posttraumatic malalignment of the cervical spine. Skull base and vertebrae: No acute fracture. No primary bone lesion or focal pathologic process. Soft tissues and spinal canal: No prevertebral fluid or swelling. No  visible canal hematoma. Disc levels: There is degenerative disc disease identified the C5-6 level. New Schmorl's node deformity is identified involving the superior endplates of C6 and C7. Upper chest: Negative. Other: None IMPRESSION: 1. No acute intracranial abnormality. 2. Chronic microvascular disease. 3. No evidence for cervical spine fracture or subluxation. 4. Cervical degenerative disc disease. Electronically Signed   By: Kerby Moors M.D.   On: 06/12/2022 12:15   CT Cervical Spine Wo Contrast  Result Date: 06/12/2022 CLINICAL DATA:  Ataxia, hypotension and dizziness EXAM: CT HEAD WITHOUT CONTRAST CT CERVICAL SPINE WITHOUT CONTRAST TECHNIQUE: Multidetector CT imaging of the head and cervical spine was performed following the standard protocol without intravenous contrast. Multiplanar CT image reconstructions of the cervical spine were also generated. RADIATION DOSE REDUCTION: This exam was performed according to the departmental dose-optimization program which includes automated exposure control, adjustment of the mA and/or kV according to patient size and/or use of iterative reconstruction technique. COMPARISON:  03/07/2021 FINDINGS: CT HEAD FINDINGS Brain: No evidence of acute infarction, hemorrhage, hydrocephalus, extra-axial collection or mass lesion/mass effect. There is mild diffuse low-attenuation within the subcortical and periventricular white matter compatible with chronic microvascular disease. Vascular: No hyperdense vessel or unexpected calcification. Skull: Normal. Negative for fracture or focal lesion. Sinuses/Orbits: Paranasal sinuses and mastoid air cells are clear. Other: None CT CERVICAL SPINE FINDINGS Alignment: Normal slight reversal wall of normal cervical lordosis. No acute posttraumatic malalignment of the cervical spine. Skull base and vertebrae: No acute fracture. No primary bone lesion or focal pathologic process. Soft tissues and spinal canal: No prevertebral fluid or  swelling. No visible canal hematoma. Disc levels: There is degenerative disc disease identified the C5-6 level. New Schmorl's node deformity is identified involving the superior endplates of C6 and C7. Upper chest: Negative. Other: None IMPRESSION: 1. No acute intracranial abnormality. 2. Chronic microvascular disease. 3. No evidence for cervical spine fracture or subluxation. 4. Cervical degenerative disc disease. Electronically Signed   By: Kerby Moors M.D.   On: 06/12/2022 12:15   CT ABDOMEN PELVIS WO CONTRAST  Result Date: 06/12/2022 CLINICAL DATA:  Abdominal pain, dialysis patient with hypotension and dizziness after 30 minutes of dialysis, diarrhea for the past day, history hypertension and end-stage renal disease EXAM: CT ABDOMEN AND PELVIS WITHOUT CONTRAST TECHNIQUE: Multidetector CT imaging of the abdomen and pelvis was performed following the standard protocol without IV contrast. RADIATION DOSE REDUCTION: This exam was performed according to the departmental dose-optimization program which includes automated exposure control, adjustment of the mA and/or kV according to patient size and/or use of iterative reconstruction technique. COMPARISON:  06/09/2022 FINDINGS: Lower chest: Emphysematous changes with complete atelectasis of LEFT lower lobe and dependent atelectasis of posterior RIGHT lower lobe. Trace pericardial effusion. Hepatobiliary: Dependent calculi within gallbladder. Gallbladder and liver otherwise  normal appearance. Pancreas: Atrophic pancreas without mass Spleen: Normal appearance Adrenals/Urinary Tract: Thickening of adrenal glands bilaterally without discrete mass. Renal vascular calcifications. No renal mass or hydronephrosis. Ureters unremarkable. Bladder decompressed. Stomach/Bowel: Appendix not visualized. Sigmoid diverticulosis. Stomach and bowel loops otherwise normal appearance for technique Vascular/Lymphatic: Extensive atherosclerotic calcifications. Aorta normal caliber. No  adenopathy. Reproductive: Uterus and adnexa unremarkable Other: No free air or free fluid. No hernia or inflammatory process. Musculoskeletal: Osseous demineralization. IMPRESSION: Cholelithiasis. Sigmoid diverticulosis without evidence of diverticulitis. Bibasilar atelectasis greater on LEFT with complete atelectasis of LEFT lower lobe. Trace pericardial effusion. No acute intra-abdominal or intrapelvic abnormalities. Electronically Signed   By: Lavonia Dana M.D.   On: 06/12/2022 12:02   DG Chest Portable 1 View  Result Date: 06/12/2022 CLINICAL DATA:  Cough. EXAM: PORTABLE CHEST 1 VIEW COMPARISON:  06/09/2022 FINDINGS: The lungs are clear without focal pneumonia, edema, pneumothorax or pleural effusion. Cardiopericardial silhouette is at upper limits of normal for size. Subsegmental atelectasis noted in the lung bases. Trace blunting of the costophrenic angles bilaterally raises the question of tiny effusions. Telemetry leads overlie the chest. IMPRESSION: Bibasilar atelectasis with possible trace bilateral pleural effusions. Electronically Signed   By: Misty Stanley M.D.   On: 06/12/2022 08:03     Medications:     [START ON 06/13/2022] Chlorhexidine Gluconate Cloth  6 each Topical Q0600   divalproex  125 mg Oral QHS   heparin  5,000 Units Subcutaneous Q8H   oxyCODONE  5 mg Oral Once   [START ON 06/13/2022] sertraline  25 mg Oral Daily   traZODone  50 mg Oral QHS   acetaminophen **OR** acetaminophen, albuterol, heparin, ondansetron **OR** ondansetron (ZOFRAN) IV, oxyCODONE-acetaminophen  Assessment/ Plan:  67 y.o. female with medical problems of end-stage renal disease, HD, diabetes, hypertension, history of left arm cellulitis in August 2023 was admitted on 06/12/2022 for  Principal Problem:   Orthostatic hypotension Active Problems:   Hypertension   Type 2 diabetes mellitus (HCC)   ESRD (end stage renal disease) on dialysis (HCC)   Vomiting and diarrhea  Orthostatic hypotension  [I95.1]  #. ESRD with hypotension [facility]en dialysis catheter/TTS/left forearm AV graft Plan for dialysis today. As needed fluid boluses for hypotension during dialysis. Carvedilol, amlodipine, diuretic on hold.  #. Anemia of CKD  Lab Results  Component Value Date   HGB 12.3 06/12/2022   Low dose EPO with HD for hemoglobin less than 10  #. Secondary hyperparathyroidism of renal origin N 25.81      Component Value Date/Time   PTH 70 (H) 05/12/2020 0946   Lab Results  Component Value Date   PHOS 2.9 12/22/2021   Monitor calcium and phos level during this admission   #. Diabetes type 2 with CKD Hgb A1c MFr Bld (%)  Date Value  09/23/2021 4.7 (L)      LOS: 0 Katie [name] 1/30/20247:04 PM  [place], [place]

## 2022-06-12 NOTE — Progress Notes (Signed)
Post hd rn assessment 

## 2022-06-12 NOTE — Progress Notes (Signed)
Pre hd rn assessment 
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
## 2022-06-12 NOTE — Progress Notes (Signed)
Delay in patient arriving to [facility] [place] for HD.

## 2022-06-12 NOTE — ED Triage Notes (Signed)
Pt to ED from dialysis txt via EMS CC of hypotension and dizziness after 30 minutes of dialysis. Pt was here 2 days ago for similar symptoms. BP was 64/37 per EMS. Pt reports diarrhea for the last day but denies fever.

## 2022-06-12 NOTE — ED Notes (Signed)
Pt brought to ed rm 17 at this time. This RN now assuming care. 

## 2022-06-12 NOTE — Progress Notes (Signed)
Reminded patient to keep left (access) arm immobile while receiving HD. Pt confirmed understanding.

## 2022-06-12 NOTE — ED Notes (Signed)
RN spoke to [place] [phone] from [facility] regarding pt updates.

## 2022-06-12 NOTE — Assessment & Plan Note (Signed)
Patient endorses 1 week of abdominal cramps, vomiting and diarrhea.  CT of the abdomen with no evidence of complications.  Likely a viral illness but will also rule out C. difficile.  - GI panel and C. difficile testing pending - Zofran as needed - IV fluids as noted above - Hold further Imodium until GI panel results

## 2022-06-13 DIAGNOSIS — D631 Anemia in chronic kidney disease: Secondary | ICD-10-CM | POA: Diagnosis present

## 2022-06-13 DIAGNOSIS — Z8249 Family history of ischemic heart disease and other diseases of the circulatory system: Secondary | ICD-10-CM | POA: Diagnosis not present

## 2022-06-13 DIAGNOSIS — Z820 Family history of epilepsy and other diseases of the nervous system: Secondary | ICD-10-CM | POA: Diagnosis not present

## 2022-06-13 DIAGNOSIS — N186 End stage renal disease: Secondary | ICD-10-CM | POA: Diagnosis present

## 2022-06-13 DIAGNOSIS — Z992 Dependence on renal dialysis: Secondary | ICD-10-CM | POA: Diagnosis not present

## 2022-06-13 DIAGNOSIS — R0902 Hypoxemia: Secondary | ICD-10-CM | POA: Diagnosis not present

## 2022-06-13 DIAGNOSIS — Z79899 Other long term (current) drug therapy: Secondary | ICD-10-CM | POA: Diagnosis not present

## 2022-06-13 DIAGNOSIS — R011 Cardiac murmur, unspecified: Secondary | ICD-10-CM | POA: Diagnosis not present

## 2022-06-13 DIAGNOSIS — I132 Hypertensive heart and chronic kidney disease with heart failure and with stage 5 chronic kidney disease, or end stage renal disease: Secondary | ICD-10-CM | POA: Diagnosis present

## 2022-06-13 DIAGNOSIS — A0839 Other viral enteritis: Secondary | ICD-10-CM | POA: Diagnosis present

## 2022-06-13 DIAGNOSIS — E861 Hypovolemia: Secondary | ICD-10-CM | POA: Diagnosis present

## 2022-06-13 DIAGNOSIS — Z66 Do not resuscitate: Secondary | ICD-10-CM | POA: Diagnosis present

## 2022-06-13 DIAGNOSIS — Z825 Family history of asthma and other chronic lower respiratory diseases: Secondary | ICD-10-CM | POA: Diagnosis not present

## 2022-06-13 DIAGNOSIS — Z1152 Encounter for screening for COVID-19: Secondary | ICD-10-CM | POA: Diagnosis not present

## 2022-06-13 DIAGNOSIS — I5032 Chronic diastolic (congestive) heart failure: Secondary | ICD-10-CM | POA: Diagnosis present

## 2022-06-13 DIAGNOSIS — E860 Dehydration: Secondary | ICD-10-CM | POA: Diagnosis present

## 2022-06-13 DIAGNOSIS — E1122 Type 2 diabetes mellitus with diabetic chronic kidney disease: Secondary | ICD-10-CM | POA: Diagnosis present

## 2022-06-13 DIAGNOSIS — I350 Nonrheumatic aortic (valve) stenosis: Secondary | ICD-10-CM | POA: Diagnosis present

## 2022-06-13 DIAGNOSIS — I951 Orthostatic hypotension: Secondary | ICD-10-CM | POA: Diagnosis present

## 2022-06-13 DIAGNOSIS — Z87891 Personal history of nicotine dependence: Secondary | ICD-10-CM | POA: Diagnosis not present

## 2022-06-13 DIAGNOSIS — Z880 Allergy status to penicillin: Secondary | ICD-10-CM | POA: Diagnosis not present

## 2022-06-13 LAB — RENAL FUNCTION PANEL
Albumin: 2.8 g/dL — ABNORMAL LOW (ref 3.5–5.0)
Anion gap: 9 (ref 5–15)
BUN: 21 mg/dL (ref 8–23)
CO2: 26 mmol/L (ref 22–32)
Calcium: 7.7 mg/dL — ABNORMAL LOW (ref 8.9–10.3)
Chloride: 100 mmol/L (ref 98–111)
Creatinine, Ser: 3.65 mg/dL — ABNORMAL HIGH (ref 0.44–1.00)
GFR, Estimated: 13 mL/min — ABNORMAL LOW (ref >60)
Glucose, Bld: 87 mg/dL (ref 70–99)
Phosphorus: 2.6 mg/dL (ref 2.5–4.6)
Potassium: 3.1 mmol/L — ABNORMAL LOW (ref 3.5–5.1)
Sodium: 135 mmol/L (ref 135–145)

## 2022-06-13 LAB — MAGNESIUM: Magnesium: 1.9 mg/dL (ref 1.7–2.4)

## 2022-06-13 MED ORDER — POTASSIUM CHLORIDE 10 MEQ/100ML IV SOLN
10.0000 meq | INTRAVENOUS | Status: AC
  Administered 2022-06-13 (×4): 10 meq via INTRAVENOUS
  Filled 2022-06-13 (×4): qty 100

## 2022-06-13 MED ORDER — MIDODRINE HCL 5 MG PO TABS
5.0000 mg | ORAL_TABLET | Freq: Three times a day (TID) | ORAL | Status: DC
  Administered 2022-06-13 – 2022-06-14 (×3): 5 mg via ORAL
  Filled 2022-06-13 (×3): qty 1

## 2022-06-13 MED ORDER — POTASSIUM CHLORIDE 10 MEQ/100ML IV SOLN: 10.0000 meq | INTRAVENOUS | Status: DC

## 2022-06-13 MED ORDER — OXYCODONE HCL 5 MG PO TABS
5.0000 mg | ORAL_TABLET | Freq: Four times a day (QID) | ORAL | Status: DC | PRN
  Administered 2022-06-17 – 2022-06-18 (×3): 5 mg via ORAL
  Filled 2022-06-13 (×3): qty 1

## 2022-06-13 NOTE — Plan of Care (Signed)
  Problem: Education: ?Goal: Knowledge of General Education information will improve ?Description: Including pain rating scale, medication(s)/side effects and non-pharmacologic comfort measures ?Outcome: Progressing ?  ?Problem: Clinical Measurements: ?Goal: Respiratory complications will improve ?Outcome: Progressing ?  ?Problem: Clinical Measurements: ?Goal: Cardiovascular complication will be avoided ?Outcome: Progressing ?  ?Problem: Pain Managment: ?Goal: General experience of comfort will improve ?Outcome: Progressing ?  ?Problem: Safety: ?Goal: Ability to remain free from injury will improve ?Outcome: Progressing ?  ?

## 2022-06-13 NOTE — Progress Notes (Signed)
[facility], [name] 06/13/22  Subjective:   LOS: 0  Patient known to our practice from previous admissions.  Presents this time for dizziness after 30 minutes of dialysis, hypotension.  Blood pressure 64/37 per EMS.  Also reported diarrhea.  Per report, appetite has been poor at home.  Patient asking for food in the emergency room stating that appetite today seems to have improved. Patient has not been able to get full dialysis treatment for the past 3 attempts.  She got only about 10 minutes on 125, 5 minutes on 127 and 53 minutes on 06/12/2022.  -Patient underwent dialysis treatment yesterday.  Antihypertensives were placed on hold.  Objective:  Vital signs in last 24 hours:  Temp:  [98.2 F (36.8 C)-98.8 F (37.1 C)] 98.4 F (36.9 C) (01/31 0738) Pulse Rate:  [67-82] 70 (01/31 0738) Resp:  [11-23] 18 (01/31 0738) BP: (97-154)/(42-91) 126/60 (01/31 0738) SpO2:  [98 %-100 %] 98 % (01/31 0738) Weight:  [65.7 kg-66 kg] 66 kg (01/30 2130)  Weight change:  Filed Weights   06/12/22 1630 06/12/22 2130  Weight: 65.7 kg 66 kg    Intake/Output:    Intake/Output Summary (Last 24 hours) at 06/13/2022 1316 Last data filed at 06/12/2022 2205 Gross per 24 hour  Intake 238 ml  Output 200 ml  Net 38 ml    Physical Exam: General:  No acute distress, laying in the bed  HEENT  anicteric, moist oral mucous membrane  Pulm/lungs  normal breathing effort, lungs are clear to auscultation  CVS/Heart  regular rhythm, no rub or gallop  Abdomen:   Soft, nontender  Extremities:  No peripheral edema  Neurologic:  Alert, oriented, able to follow commands  Skin:  No acute rashes  Left arm AV fistula.     Basic Metabolic Panel:  Recent Labs  Lab 06/09/22 0849 06/12/22 0733 06/13/22 0425 06/13/22 0904  NA 134* 134* 135  --   K 3.4* 3.2* 3.1*  --   CL 100 100 100  --   CO2 20* 20* 26  --   GLUCOSE 88 73 87  --   BUN 44* 44* 21  --   CREATININE 6.75* 6.56*  3.65*  --   CALCIUM 8.5* 8.1* 7.7*  --   MG  --   --   --  1.9  PHOS  --   --  2.6  --       CBC: Recent Labs  Lab 06/09/22 0849 06/12/22 0733  WBC 5.6 8.5  NEUTROABS 4.2 7.1  HGB 11.9* 12.3  HCT 38.7 39.9  MCV 100.3* 99.8  PLT 231 233       Lab Results  Component Value Date   HEPBSAG NON REACTIVE 06/12/2022   HEPBSAB NON REACTIVE 11/04/2021   HEPBIGM NON REACTIVE 12/19/2021      Microbiology:  Recent Results (from the past 240 hour(s))  Blood Culture (routine x 2)     Status: None (Preliminary result)   Collection Time: 06/09/22  8:48 AM   Specimen: BLOOD  Result Value Ref Range Status   Specimen Description BLOOD BLOOD RIGHT HAND  Final   Special Requests   Final    BOTTLES DRAWN AEROBIC AND ANAEROBIC Blood Culture adequate volume   Culture   Final    NO GROWTH 4 DAYS Performed at [facility], [address] [place],  [address]    Report Status PENDING  Incomplete  Blood Culture (routine x 2)  Status: Abnormal   Collection Time: 06/09/22 10:18 AM   Specimen: BLOOD  Result Value Ref Range Status   Specimen Description   Final    BLOOD RIGHT ARM Performed at [facility], [address], [place], [place] [address]    Special Requests   Final    BOTTLES DRAWN AEROBIC AND ANAEROBIC Blood Culture adequate volume Performed at [facility], [address] [place], [place] [address]    Culture  Setup Time   Final    GRAM POSITIVE COCCI IN BOTH AEROBIC AND ANAEROBIC BOTTLES CRITICAL RESULT CALLED TO, READ BACK BY AND VERIFIED WITH: Clent Ridges RN ED @ [phone] 06/10/22 LFD    Culture (A)  Final    STAPHYLOCOCCUS HOMINIS STAPHYLOCOCCUS EPIDERMIDIS THE SIGNIFICANCE OF ISOLATING THIS ORGANISM FROM A SINGLE SET OF BLOOD CULTURES WHEN MULTIPLE SETS ARE DRAWN IS UNCERTAIN. PLEASE NOTIFY THE MICROBIOLOGY DEPARTMENT WITHIN ONE WEEK IF SPECIATION AND SENSITIVITIES ARE REQUIRED. Performed at [facility], [place] [address] [place], [place] [address]    Report Status 06/12/2022 FINAL  Final  Blood Culture ID Panel (Reflexed)     Status: Abnormal   Collection Time: 06/09/22 10:18 AM  Result Value Ref Range Status   Enterococcus faecalis NOT DETECTED NOT DETECTED Final   Enterococcus Faecium NOT DETECTED NOT DETECTED Final   Listeria monocytogenes NOT DETECTED NOT DETECTED Final   Staphylococcus species DETECTED (A) NOT DETECTED Final    Comment: CRITICAL RESULT CALLED TO, READ BACK BY AND VERIFIED WITH: Clent Ridges RN ED @ [phone] 06/10/22 LFD    Staphylococcus aureus (BCID) NOT DETECTED NOT DETECTED Final   Staphylococcus epidermidis DETECTED (A) NOT DETECTED Final    Comment: Methicillin (oxacillin) resistant coagulase negative staphylococcus. Possible blood culture contaminant (unless isolated from more than one blood culture draw or clinical case suggests pathogenicity). No antibiotic treatment is indicated for blood  culture contaminants. CRITICAL RESULT CALLED TO, READ BACK BY AND VERIFIED WITH: Clent Ridges RN ED @ [phone] 06/10/22 LFD    Staphylococcus lugdunensis NOT DETECTED NOT DETECTED Final   Streptococcus species NOT DETECTED NOT DETECTED Final   Streptococcus agalactiae NOT DETECTED NOT DETECTED Final   Streptococcus pneumoniae NOT DETECTED NOT DETECTED Final   Streptococcus pyogenes NOT DETECTED NOT DETECTED Final   A.calcoaceticus-baumannii NOT DETECTED NOT DETECTED Final   Bacteroides fragilis NOT DETECTED NOT DETECTED Final   Enterobacterales NOT DETECTED NOT DETECTED Final   Enterobacter cloacae complex NOT DETECTED NOT DETECTED Final   Escherichia coli NOT DETECTED NOT DETECTED Final   Klebsiella aerogenes NOT DETECTED NOT DETECTED Final   Klebsiella oxytoca NOT DETECTED NOT DETECTED Final   Klebsiella pneumoniae NOT DETECTED NOT DETECTED Final   Proteus species NOT DETECTED NOT DETECTED Final   Salmonella species NOT DETECTED NOT DETECTED Final   Serratia marcescens NOT DETECTED NOT DETECTED  Final   Haemophilus influenzae NOT DETECTED NOT DETECTED Final   Neisseria meningitidis NOT DETECTED NOT DETECTED Final   Pseudomonas aeruginosa NOT DETECTED NOT DETECTED Final   Stenotrophomonas maltophilia NOT DETECTED NOT DETECTED Final   Candida albicans NOT DETECTED NOT DETECTED Final   Candida auris NOT DETECTED NOT DETECTED Final   Candida glabrata NOT DETECTED NOT DETECTED Final   Candida krusei NOT DETECTED NOT DETECTED Final   Candida parapsilosis NOT DETECTED NOT DETECTED Final   Candida tropicalis NOT DETECTED NOT DETECTED Final   Cryptococcus neoformans/gattii NOT DETECTED NOT DETECTED Final   Methicillin resistance mecA/C DETECTED (A) NOT DETECTED Final  Comment: CRITICAL RESULT CALLED TO, READ BACK BY AND VERIFIED WITH: Clent Ridges RN ED @ [phone] 06/10/22 LFD Performed at [facility], [address] [place], [place] [address]   Blood culture (routine x 2)     Status: None (Preliminary result)   Collection Time: 06/12/22  7:30 AM   Specimen: BLOOD RIGHT ARM  Result Value Ref Range Status   Specimen Description BLOOD RIGHT ARM  Final   Special Requests   Final    BOTTLES DRAWN AEROBIC AND ANAEROBIC Blood Culture adequate volume   Culture   Final    NO GROWTH < 24 HOURS Performed at [facility], [address], [place], [place] [address]    Report Status PENDING  Incomplete  Resp panel by RT-PCR (RSV, Flu A&B, Covid) Anterior Nasal Swab     Status: None   Collection Time: 06/12/22  7:32 AM   Specimen: Anterior Nasal Swab  Result Value Ref Range Status   SARS Coronavirus 2 by RT PCR NEGATIVE NEGATIVE Final    Comment: (NOTE) SARS-CoV-2 target nucleic acids are NOT DETECTED.  The SARS-CoV-2 RNA is generally detectable in upper respiratory specimens during the acute phase of infection. The lowest concentration of SARS-CoV-2 viral copies this assay can detect is 138 copies/mL. A negative result does not preclude SARS-Cov-2 infection and should not  be used as the sole basis for treatment or other patient management decisions. A negative result may occur with  improper specimen collection/handling, submission of specimen other than nasopharyngeal swab, presence of viral mutation(s) within the areas targeted by this assay, and inadequate number of viral copies(<138 copies/mL). A negative result must be combined with clinical observations, patient history, and epidemiological information. The expected result is Negative.  Fact Sheet for Patients:  EntrepreneurPulse.com.au  Fact Sheet for Healthcare Providers:  IncredibleEmployment.be  This test is no t yet approved or cleared by the [place] FDA and  has been authorized for detection and/or diagnosis of SARS-CoV-2 by FDA under an Emergency Use Authorization (EUA). This EUA will remain  in effect (meaning this test can be used) for the duration of the COVID-19 declaration under Section 564(b)(1) of the Act, 21 U.S.C.section 360bbb-3(b)(1), unless the authorization is terminated  or revoked sooner.       Influenza A by PCR NEGATIVE NEGATIVE Final   Influenza B by PCR NEGATIVE NEGATIVE Final    Comment: (NOTE) The Xpert Xpress SARS-CoV-2/FLU/RSV plus assay is intended as an aid in the diagnosis of influenza from Nasopharyngeal swab specimens and should not be used as a sole basis for treatment. Nasal washings and aspirates are unacceptable for Xpert Xpress SARS-CoV-2/FLU/RSV testing.  Fact Sheet for Patients: EntrepreneurPulse.com.au  Fact Sheet for Healthcare Providers: IncredibleEmployment.be  This test is not yet approved or cleared by the [place] FDA and has been authorized for detection and/or diagnosis of SARS-CoV-2 by FDA under an Emergency Use Authorization (EUA). This EUA will remain in effect (meaning this test can be used) for the duration of the COVID-19 declaration under Section  564(b)(1) of the Act, 21 U.S.C. section 360bbb-3(b)(1), unless the authorization is terminated or revoked.     Resp Syncytial Virus by PCR NEGATIVE NEGATIVE Final    Comment: (NOTE) Fact Sheet for Patients: EntrepreneurPulse.com.au  Fact Sheet for Healthcare Providers: IncredibleEmployment.be  This test is not yet approved or cleared by the [place] FDA and has been authorized for detection and/or diagnosis of SARS-CoV-2 by FDA under an Emergency Use Authorization (EUA). This EUA  will remain in effect (meaning this test can be used) for the duration of the COVID-19 declaration under Section 564(b)(1) of the Act, 21 U.S.C. section 360bbb-3(b)(1), unless the authorization is terminated or revoked.  Performed at [facility], [address] [place], [place] [address]   Blood culture (routine x 2)     Status: None (Preliminary result)   Collection Time: 06/12/22  9:59 PM   Specimen: BLOOD RIGHT HAND  Result Value Ref Range Status   Specimen Description BLOOD RIGHT HAND  Final   Special Requests   Final    BOTTLES DRAWN AEROBIC AND ANAEROBIC Blood Culture adequate volume   Culture   Final    NO GROWTH < 12 HOURS Performed at [facility], [address], [place], [place] [address]    Report Status PENDING  Incomplete    Coagulation Studies: No results for input(s): "LABPROT", "INR" in the last 72 hours.  Urinalysis: No results for input(s): "COLORURINE", "LABSPEC", "PHURINE", "GLUCOSEU", "HGBUR", "BILIRUBINUR", "KETONESUR", "PROTEINUR", "UROBILINOGEN", "NITRITE", "LEUKOCYTESUR" in the last 72 hours.  Invalid input(s): "APPERANCEUR"    Imaging: CT HEAD WO CONTRAST (5MM)  Result Date: 06/12/2022 CLINICAL DATA:  Ataxia, hypotension and dizziness EXAM: CT HEAD WITHOUT CONTRAST CT CERVICAL SPINE WITHOUT CONTRAST TECHNIQUE: Multidetector CT imaging of the head and cervical spine was performed following the standard  protocol without intravenous contrast. Multiplanar CT image reconstructions of the cervical spine were also generated. RADIATION DOSE REDUCTION: This exam was performed according to the departmental dose-optimization program which includes automated exposure control, adjustment of the mA and/or kV according to patient size and/or use of iterative reconstruction technique. COMPARISON:  03/07/2021 FINDINGS: CT HEAD FINDINGS Brain: No evidence of acute infarction, hemorrhage, hydrocephalus, extra-axial collection or mass lesion/mass effect. There is mild diffuse low-attenuation within the subcortical and periventricular white matter compatible with chronic microvascular disease. Vascular: No hyperdense vessel or unexpected calcification. Skull: Normal. Negative for fracture or focal lesion. Sinuses/Orbits: Paranasal sinuses and mastoid air cells are clear. Other: None CT CERVICAL SPINE FINDINGS Alignment: Normal slight reversal wall of normal cervical lordosis. No acute posttraumatic malalignment of the cervical spine. Skull base and vertebrae: No acute fracture. No primary bone lesion or focal pathologic process. Soft tissues and spinal canal: No prevertebral fluid or swelling. No visible canal hematoma. Disc levels: There is degenerative disc disease identified the C5-6 level. New Schmorl's node deformity is identified involving the superior endplates of C6 and C7. Upper chest: Negative. Other: None IMPRESSION: 1. No acute intracranial abnormality. 2. Chronic microvascular disease. 3. No evidence for cervical spine fracture or subluxation. 4. Cervical degenerative disc disease. Electronically Signed   By: Kerby Moors M.D.   On: 06/12/2022 12:15   CT Cervical Spine Wo Contrast  Result Date: 06/12/2022 CLINICAL DATA:  Ataxia, hypotension and dizziness EXAM: CT HEAD WITHOUT CONTRAST CT CERVICAL SPINE WITHOUT CONTRAST TECHNIQUE: Multidetector CT imaging of the head and cervical spine was performed following the  standard protocol without intravenous contrast. Multiplanar CT image reconstructions of the cervical spine were also generated. RADIATION DOSE REDUCTION: This exam was performed according to the departmental dose-optimization program which includes automated exposure control, adjustment of the mA and/or kV according to patient size and/or use of iterative reconstruction technique. COMPARISON:  03/07/2021 FINDINGS: CT HEAD FINDINGS Brain: No evidence of acute infarction, hemorrhage, hydrocephalus, extra-axial collection or mass lesion/mass effect. There is mild diffuse low-attenuation within the subcortical and periventricular white matter compatible with chronic microvascular disease. Vascular: No hyperdense vessel or unexpected  calcification. Skull: Normal. Negative for fracture or focal lesion. Sinuses/Orbits: Paranasal sinuses and mastoid air cells are clear. Other: None CT CERVICAL SPINE FINDINGS Alignment: Normal slight reversal wall of normal cervical lordosis. No acute posttraumatic malalignment of the cervical spine. Skull base and vertebrae: No acute fracture. No primary bone lesion or focal pathologic process. Soft tissues and spinal canal: No prevertebral fluid or swelling. No visible canal hematoma. Disc levels: There is degenerative disc disease identified the C5-6 level. New Schmorl's node deformity is identified involving the superior endplates of C6 and C7. Upper chest: Negative. Other: None IMPRESSION: 1. No acute intracranial abnormality. 2. Chronic microvascular disease. 3. No evidence for cervical spine fracture or subluxation. 4. Cervical degenerative disc disease. Electronically Signed   By: Kerby Moors M.D.   On: 06/12/2022 12:15   CT ABDOMEN PELVIS WO CONTRAST  Result Date: 06/12/2022 CLINICAL DATA:  Abdominal pain, dialysis patient with hypotension and dizziness after 30 minutes of dialysis, diarrhea for the past day, history hypertension and end-stage renal disease EXAM: CT ABDOMEN  AND PELVIS WITHOUT CONTRAST TECHNIQUE: Multidetector CT imaging of the abdomen and pelvis was performed following the standard protocol without IV contrast. RADIATION DOSE REDUCTION: This exam was performed according to the departmental dose-optimization program which includes automated exposure control, adjustment of the mA and/or kV according to patient size and/or use of iterative reconstruction technique. COMPARISON:  06/09/2022 FINDINGS: Lower chest: Emphysematous changes with complete atelectasis of LEFT lower lobe and dependent atelectasis of posterior RIGHT lower lobe. Trace pericardial effusion. Hepatobiliary: Dependent calculi within gallbladder. Gallbladder and liver otherwise normal appearance. Pancreas: Atrophic pancreas without mass Spleen: Normal appearance Adrenals/Urinary Tract: Thickening of adrenal glands bilaterally without discrete mass. Renal vascular calcifications. No renal mass or hydronephrosis. Ureters unremarkable. Bladder decompressed. Stomach/Bowel: Appendix not visualized. Sigmoid diverticulosis. Stomach and bowel loops otherwise normal appearance for technique Vascular/Lymphatic: Extensive atherosclerotic calcifications. Aorta normal caliber. No adenopathy. Reproductive: Uterus and adnexa unremarkable Other: No free air or free fluid. No hernia or inflammatory process. Musculoskeletal: Osseous demineralization. IMPRESSION: Cholelithiasis. Sigmoid diverticulosis without evidence of diverticulitis. Bibasilar atelectasis greater on LEFT with complete atelectasis of LEFT lower lobe. Trace pericardial effusion. No acute intra-abdominal or intrapelvic abnormalities. Electronically Signed   By: Lavonia Dana M.D.   On: 06/12/2022 12:02   DG Chest Portable 1 View  Result Date: 06/12/2022 CLINICAL DATA:  Cough. EXAM: PORTABLE CHEST 1 VIEW COMPARISON:  06/09/2022 FINDINGS: The lungs are clear without focal pneumonia, edema, pneumothorax or pleural effusion. Cardiopericardial silhouette is at  upper limits of normal for size. Subsegmental atelectasis noted in the lung bases. Trace blunting of the costophrenic angles bilaterally raises the question of tiny effusions. Telemetry leads overlie the chest. IMPRESSION: Bibasilar atelectasis with possible trace bilateral pleural effusions. Electronically Signed   By: Misty Stanley M.D.   On: 06/12/2022 08:03     Medications:    potassium chloride 10 mEq (06/13/22 1259)    Chlorhexidine Gluconate Cloth  6 each Topical Q0600   divalproex  125 mg Oral QHS   heparin  5,000 Units Subcutaneous Q8H   midodrine  5 mg Oral TID WC   oxyCODONE  5 mg Oral Once   sertraline  25 mg Oral Daily   traZODone  50 mg Oral QHS   acetaminophen **OR** acetaminophen, albuterol, heparin, ondansetron **OR** ondansetron (ZOFRAN) IV, oxyCODONE  Assessment/ Plan:  67 y.o. female with medical problems of end-stage renal disease, HD, diabetes, hypertension, history of left arm cellulitis in August 2023 was admitted on  06/12/2022 for  Principal Problem:   Orthostatic hypotension Active Problems:   Hypertension   Type 2 diabetes mellitus (HCC)   ESRD (end stage renal disease) on dialysis (HCC)   Vomiting and diarrhea  Orthostatic hypotension [I95.1] ESRD (end stage renal disease) ([place]) [N18.6] Pre-syncope [R55]  #. ESRD with hypotension [facility]/[place] dialysis catheter/TTS/left forearm AV graft Patient underwent hemodialysis treatment yesterday.  Carvedilol, amlodipine, and diuretics were placed on hold.  Next Allises treatment scheduled for tomorrow.  #. Anemia of CKD  Lab Results  Component Value Date   HGB 12.3 06/12/2022   Low dose EPO with HD for hemoglobin less than 10, hold off on erythropoietin stimulating agents for now.  #. Secondary hyperparathyroidism of renal origin N 25.81      Component Value Date/Time   PTH 70 (H) 05/12/2020 0946   Lab Results  Component Value Date   PHOS 2.6 06/13/2022   Phosphorus acceptable at 2.6.   #.   Orthostatic hypotension. Antihypertensives placed on hold.  Use midodrine as necessary.   LOS: 0 Lawayne Hartig 1/31/20241:16 PM  [place], [place]

## 2022-06-13 NOTE — Progress Notes (Signed)
[facility] Progress Note  Patient: Katie [name]    ZOX:[id number]  DOA: 06/12/2022     Date of Service: the patient was seen and examined on 06/13/2022  Chief Complaint  Patient presents with   Dizziness   Hypotension   Brief hospital course: Adaley Kiene is a 67 y.o. female with medical history significant of ESRD on HD (T, Th, Sa), hypertension, who presents to the ED due to dizziness for 1 wk, associated with chills, abdominal cramps, nausea and vomiting, and diarrhea.  She states she has numerous diarrheal episodes per day but it has been nonbloody and nonmelanotic.  She states she has vomited daily but her last episode was yesterday.  Due to the symptoms, she endorses poor p.o. intake.  In addition, she has missed dialysis for the last 3 sessions due to difficulty with transportation and her current symptoms.  Today, when she went for dialysis, she noticed rapid onset dizziness and near syncope.  She denies loss of consciousness.  Due to this, EMS was called.  She denies any chest pain, shortness of breath, cough, lower extremity edema.   ED course: On arrival to the ED, patient was initially hypotensive at 99/57 with heart rate of 63.  She was saturating at 100% on room air.  Orthostatic vital signs were obtained that demonstrated a marked 44 drop in systolic blood pressure with BP of 62/39 when standing.  Patient received 1 L of normal saline.  Despite this, patient continued to be symptomatic.  TRH contacted for admission.  Nephrology consulted.   Assessment and Plan: # Orthostatic hypotension Significantly symptomatic orthostatic hypotension in the setting of a diarrheal illness and ongoing antihypertensive use.  She appears hypovolemic on examination.   S/p 1 L of NS in the ED and  1 L during dialysis on 1/30  S/p Midodrine 5 mg once prior to dialysis, started midodrine 5 mg p.o. 3 times daily with holding parameters Continue to hold antihypertensive medications Monitor  orthostatic vital signs daily.    Vomiting and diarrhea Patient endorses 1 week of abdominal cramps, vomiting and diarrhea.  CT of the abdomen with no evidence of complications.  Likely a viral illness but will also rule out C. difficile. F/u GI panel and C. difficile testing pending - Zofran as needed S/p IV fluids as noted above - Hold further Imodium until GI panel results    ESRD (end stage renal disease) on dialysis  - Nephrology consulted; appreciate their recommendations   Type 2 diabetes mellitus  Patient is not currently on any antiglycemic agents at home. -No indication for SSI at this time   Hypertension Patient is on amlodipine and carvedilol at home.   Given that she chronically becomes dizzy during dialysis with an acute exacerbation at this time, I suspect that she may require a higher blood pressure to allow for hemodynamic changes that will occur during dialysis - Hold home Coreg and amlodipine   Body mass index is 23.48 kg/m.  Interventions:   Pressure Injury Buttocks (Active)     Location: Buttocks  Location Orientation:   Staging:   Wound Description (Comments):   Present on Admission:      Diet: Regular diet DVT Prophylaxis: Subcutaneous Heparin    Advance goals of care discussion: DNR  Family Communication: family was not present at bedside, at the time of interview.  The pt provided permission to discuss medical plan with the family. Opportunity was given to ask question and all questions were answered satisfactorily.  Disposition:  Pt is from ALF, admitted with dizziness, still has orthostatic hypotension, which precludes a safe discharge. Discharge to ALF, when clinically stable, may need 1-2 more days to improve.  Subjective: No significant events overnight, patient still feels dizzy while standing up, patient has orthostatic hypotension, complaining of mild nausea, no vomiting.  Patient is passing gas, no diarrhea today but patient had  diarrheal episodes around 10 yesterday. Denies any palpitations, no chest pain, no shortness of breath.   Physical Exam: General: NAD, lying comfortably Appear in no distress, affect appropriate Eyes: PERRLA ENT: Oral Mucosa Clear, moist  Neck: no JVD,  Cardiovascular: S1 and S2 Present, no Murmur,  Respiratory: good respiratory effort, Bilateral Air entry equal and Decreased, no Crackles, no wheezes Abdomen: Bowel Sound present, Soft and no tenderness,  Skin: no rashes Extremities: no Pedal edema, no calf tenderness Neurologic: without any new focal findings Gait not checked due to patient safety concerns  Vitals:   06/12/22 2041 06/12/22 2130 06/13/22 0438 06/13/22 0738  BP: (!) 127/91 (!) 154/65 (!) 137/56 126/60  Pulse: 77 78 70 70  Resp: 18 20 20 18   Temp:  98.6 F (37 C) 98.3 F (36.8 C) 98.4 F (36.9 C)  TempSrc:  Oral Oral Oral  SpO2: 100% 100% 98% 98%  Weight:  66 kg    Height:  5\' 6"  (1.676 m)      Intake/Output Summary (Last 24 hours) at 06/13/2022 1513 Last data filed at 06/12/2022 2205 Gross per 24 hour  Intake 238 ml  Output 200 ml  Net 38 ml   Filed Weights   06/12/22 1630 06/12/22 2130  Weight: 65.7 kg 66 kg    Data Reviewed: I have personally reviewed and interpreted daily labs, tele strips, imagings as discussed above. I reviewed all nursing notes, pharmacy notes, vitals, pertinent old records I have discussed plan of care as described above with RN and patient/family.  CBC: Recent Labs  Lab 06/09/22 0849 06/12/22 0733  WBC 5.6 8.5  NEUTROABS 4.2 7.1  HGB 11.9* 12.3  HCT 38.7 39.9  MCV 100.3* 99.8  PLT 231 546   Basic Metabolic Panel: Recent Labs  Lab 06/09/22 0849 06/12/22 0733 06/13/22 0425 06/13/22 0904  NA 134* 134* 135  --   K 3.4* 3.2* 3.1*  --   CL 100 100 100  --   CO2 20* 20* 26  --   GLUCOSE 88 73 87  --   BUN 44* 44* 21  --   CREATININE 6.75* 6.56* 3.65*  --   CALCIUM 8.5* 8.1* 7.7*  --   MG  --   --   --  1.9   PHOS  --   --  2.6  --     Studies: No results found.  Scheduled Meds:  Chlorhexidine Gluconate Cloth  6 each Topical Q0600   divalproex  125 mg Oral QHS   heparin  5,000 Units Subcutaneous Q8H   midodrine  5 mg Oral TID WC   oxyCODONE  5 mg Oral Once   sertraline  25 mg Oral Daily   traZODone  50 mg Oral QHS   Continuous Infusions: PRN Meds: acetaminophen **OR** acetaminophen, albuterol, heparin, ondansetron **OR** ondansetron (ZOFRAN) IV, oxyCODONE  Time spent: 35 minutes  Author: Val Riles. MD [facility] 06/13/2022 3:13 PM  To reach On-call, see care teams to locate the attending and reach out to them via [email or web address]. If 7PM-7AM, please contact night-coverage If you still have difficulty reaching the  attending provider, please page the [facility] (Director on Call) for [facility] on amion for assistance.

## 2022-06-13 NOTE — Evaluation (Signed)
Occupational Therapy Evaluation Patient Details Name: Katie [name] MRN: [id number] DOB: 1955/08/22 Today's Date: 06/13/2022   History of Present Illness Katie [name] is a 67 y.o. female with medical history significant of ESRD on HD (T, Th, Sa), hypertension, who presents to the ED due to dizziness.   Clinical Impression   Patient presenting with decreased independence in self care, balance, functional mobility/transfers, and endurance. PTA pt reports living in ALF where she was Mod I for ADLs and functional mobility using RW. Receives assistance for IADLs from facility. Pt currently functioning at supervision-Min A for bed mobility and CGA-Min A to stand from EOB. Anticipate CGA-Min A for standing ADL tasks. +orthostatics during session (see below). Pt able to stand long enough to get standing BP, however, then requesting to lay back down 2/2 dizziness. Pt will benefit from acute OT to increase overall independence in the areas of ADLs and functional mobility in order to safely discharge back to ALF. Pt could benefit from [facility] following D/C to decrease falls risk, improve balance, and maximize independence in self-care within own home environment.   BP supine: 124/60 (MAP 79), pulse 71  BP sitting: 100/52 (MAP 67), pulse 75 BP standing: 91/43 (MAP 59), pulse 82   Recommendations for follow up therapy are one component of a multi-disciplinary discharge planning process, led by the attending physician.  Recommendations may be updated based on patient status, additional functional criteria and insurance authorization.   Follow Up Recommendations  Home health OT (at ALF)     Assistance Recommended at Discharge Frequent or constant Supervision/Assistance  Patient can return home with the following A little help with walking and/or transfers;A little help with bathing/dressing/bathroom;Assistance with cooking/housework;Assist for transportation;Help with stairs or ramp for entrance    Functional  Status Assessment  Patient has had a recent decline in their functional status and demonstrates the ability to make significant improvements in function in a reasonable and predictable amount of time.  Equipment Recommendations  None recommended by OT    Recommendations for Other Services       Precautions / Restrictions Precautions Precautions: Fall Restrictions Weight Bearing Restrictions: No      Mobility Bed Mobility Overal bed mobility: Needs Assistance Bed Mobility: Supine to Sit, Sit to Supine     Supine to sit: HOB elevated, Min assist (for trunk elevation) Sit to supine: Supervision        Transfers Overall transfer level: Needs assistance Equipment used: Rolling walker (2 wheels) Transfers: Sit to/from Stand Sit to Stand: Min guard, Min assist (from EOB)                  Balance Overall balance assessment: Needs assistance Sitting-balance support: Feet supported Sitting balance-Leahy Scale: Good     Standing balance support: Bilateral upper extremity supported, During functional activity, Reliant on assistive device for balance Standing balance-Leahy Scale: Fair                             ADL either performed or assessed with clinical judgement   ADL Overall ADL's : Needs assistance/impaired     Grooming: Set up;Supervision/safety;Sitting               Lower Body Dressing: Set up;Sitting/lateral leans;Supervision/safety Lower Body Dressing Details (indicate cue type and reason): anticipate Toilet Transfer: Rolling walker (2 wheels);Min guard;Minimal assistance Toilet Transfer Details (indicate cue type and reason): simulated  General ADL Comments: Anticipate CGA-Min A for standing ADL tasks. Therapist setting up room in order for pt to complete [facility] transfer, however, pt deferred.     Vision Patient Visual Report: No change from baseline       Perception     Praxis      Pertinent Vitals/Pain Pain  Assessment Pain Assessment: Faces Faces Pain Scale: Hurts a little bit Pain Location: generalized Pain Descriptors / Indicators: Aching, Sore Pain Intervention(s): Limited activity within patient's tolerance, Monitored during session, Repositioned, RN gave pain meds during session     Hand Dominance Right   Extremity/Trunk Assessment Upper Extremity Assessment Upper Extremity Assessment: Generalized weakness   Lower Extremity Assessment Lower Extremity Assessment: Generalized weakness       Communication Communication Communication: No difficulties   Cognition Arousal/Alertness: Awake/alert Behavior During Therapy: WFL for tasks assessed/performed Overall Cognitive Status: Within Functional Limits for tasks assessed                                 General Comments: cooperative although not thrilled about therapist asking her to get OOB, interesting personality (can be blunt but not in a mean way)     General Comments       Exercises Other Exercises Other Exercises: OT provided education re: role of OT, OT POC, post acute recs, sitting up for all meals, EOB/OOB mobility with assistance, home/fall safety.     Shoulder Instructions      Home Living Family/patient expects to be discharged to:: Assisted living                             Home Equipment: Rolling Walker (2 wheels);Shower seat;Hand held shower head   Additional Comments: Pt reports she has been living at ALF for several months. walk in shower      Prior Functioning/Environment Prior Level of Function : Independent/Modified Independent             Mobility Comments: Mod I using RW. Able to amb in room, to bathroom, and dining hall. No falls ADLs Comments: Mod I for ADLs, shower seat for bathing, facility provides meals, does not drive.        OT Problem List: Decreased strength;Decreased activity tolerance;Impaired balance (sitting and/or standing);Cardiopulmonary status  limiting activity      OT Treatment/Interventions: Self-care/ADL training;Therapeutic exercise;Therapeutic activities;Energy conservation;DME and/or AE instruction;Patient/family education;Balance training    OT Goals(Current goals can be found in the care plan section) Acute Rehab OT Goals Patient Stated Goal: return to ALF OT Goal Formulation: With patient Time For Goal Achievement: 06/27/22 Potential to Achieve Goals: Good   OT Frequency: Min 2X/week    Co-evaluation              AM-PAC OT "6 Clicks" Daily Activity     Outcome Measure Help from another person eating meals?: None Help from another person taking care of personal grooming?: A Little Help from another person toileting, which includes using toliet, bedpan, or urinal?: A Little Help from another person bathing (including washing, rinsing, drying)?: A Little Help from another person to put on and taking off regular upper body clothing?: None Help from another person to put on and taking off regular lower body clothing?: A Little 6 Click Score: 20   End of Session Equipment Utilized During Treatment: Gait belt;Rolling walker (2 wheels) Nurse Communication: Mobility status;Other (comment) (BP)  Activity Tolerance: Patient tolerated treatment well;Other (comment) (dizziness) Patient left: in bed;with call bell/phone within reach;with bed alarm set (RN present at beginning to give pain meds, MD present at end of session)  OT Visit Diagnosis: Other abnormalities of gait and mobility (R26.89);Muscle weakness (generalized) (M62.81);Pain                Time: 0903-0149 OT Time Calculation (min): 25 min Charges:  OT General Charges $OT Visit: 1 Visit OT Evaluation $OT Eval Low Complexity: 1 Low  [facility] MS, OTR/L ascom [phone]  06/13/22, 1:26 PM

## 2022-06-13 NOTE — Progress Notes (Signed)
PT Cancellation Note  Patient Details Name: Katie [name] MRN: [id number] DOB: Dec 20, 1955   Cancelled Treatment:    Reason Eval/Treat Not Completed: Fatigue/lethargy limiting ability to participate. Patient states she has been up several times today and is just tired now. Will re-attempt tomorrow.    Stella Encarnacion 06/13/2022, 3:35 PM
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
## 2022-06-13 NOTE — Progress Notes (Signed)
Bedside report given to RN

## 2022-06-14 DIAGNOSIS — I951 Orthostatic hypotension: Secondary | ICD-10-CM | POA: Diagnosis not present

## 2022-06-14 LAB — C DIFFICILE QUICK SCREEN W PCR REFLEX
C Diff antigen: NEGATIVE
C Diff interpretation: NOT DETECTED
C Diff toxin: NEGATIVE

## 2022-06-14 LAB — GASTROINTESTINAL PANEL BY PCR, STOOL (REPLACES STOOL CULTURE)
Adenovirus F40/41: NOT DETECTED
Astrovirus: NOT DETECTED
Campylobacter species: NOT DETECTED
Cryptosporidium: NOT DETECTED
Cyclospora cayetanensis: NOT DETECTED
Entamoeba histolytica: NOT DETECTED
Enteroaggregative E coli (EAEC): NOT DETECTED
Enteropathogenic E coli (EPEC): NOT DETECTED
Enterotoxigenic E coli (ETEC): NOT DETECTED
Giardia lamblia: NOT DETECTED
Norovirus GI/GII: NOT DETECTED
Plesimonas shigelloides: NOT DETECTED
Rotavirus A: NOT DETECTED
Salmonella species: NOT DETECTED
Sapovirus (I, II, IV, and V): DETECTED — AB
Shiga like toxin producing E coli (STEC): NOT DETECTED
Shigella/Enteroinvasive E coli (EIEC): NOT DETECTED
Vibrio cholerae: NOT DETECTED
Vibrio species: NOT DETECTED
Yersinia enterocolitica: NOT DETECTED

## 2022-06-14 LAB — CULTURE, BLOOD (ROUTINE X 2)
Culture: NO GROWTH
Special Requests: ADEQUATE

## 2022-06-14 LAB — CBC
HCT: 32.4 % — ABNORMAL LOW (ref 36.0–46.0)
Hemoglobin: 10.3 g/dL — ABNORMAL LOW (ref 12.0–15.0)
MCH: 31 pg (ref 26.0–34.0)
MCHC: 31.8 g/dL (ref 30.0–36.0)
MCV: 97.6 fL (ref 80.0–100.0)
Platelets: 177 10*3/uL (ref 150–400)
RBC: 3.32 MIL/uL — ABNORMAL LOW (ref 3.87–5.11)
RDW: 15.7 % — ABNORMAL HIGH (ref 11.5–15.5)
WBC: 6.4 10*3/uL (ref 4.0–10.5)
nRBC: 0 % (ref 0.0–0.2)

## 2022-06-14 LAB — BASIC METABOLIC PANEL
Anion gap: 7 (ref 5–15)
BUN: 31 mg/dL — ABNORMAL HIGH (ref 8–23)
CO2: 24 mmol/L (ref 22–32)
Calcium: 7.9 mg/dL — ABNORMAL LOW (ref 8.9–10.3)
Chloride: 104 mmol/L (ref 98–111)
Creatinine, Ser: 4.71 mg/dL — ABNORMAL HIGH (ref 0.44–1.00)
GFR, Estimated: 10 mL/min — ABNORMAL LOW (ref >60)
Glucose, Bld: 94 mg/dL (ref 70–99)
Potassium: 4.2 mmol/L (ref 3.5–5.1)
Sodium: 135 mmol/L (ref 135–145)

## 2022-06-14 LAB — HEPATITIS B SURFACE ANTIBODY, QUANTITATIVE: Hep B S AB Quant (Post): 5.4 m[IU]/mL — ABNORMAL LOW (ref >9.9)

## 2022-06-14 LAB — PHOSPHORUS: Phosphorus: 3.4 mg/dL (ref 2.5–4.6)

## 2022-06-14 LAB — MAGNESIUM: Magnesium: 1.9 mg/dL (ref 1.7–2.4)

## 2022-06-14 MED ORDER — ONDANSETRON HCL 4 MG/2ML IJ SOLN
INTRAMUSCULAR | Status: AC
  Administered 2022-06-14: 4 mg via INTRAVENOUS
  Filled 2022-06-14: qty 2

## 2022-06-14 MED ORDER — DIPHENHYDRAMINE HCL 50 MG/ML IJ SOLN
25.0000 mg | INTRAMUSCULAR | Status: DC
  Administered 2022-06-16: 25 mg via INTRAVENOUS

## 2022-06-14 MED ORDER — DIPHENHYDRAMINE HCL 50 MG/ML IJ SOLN
INTRAMUSCULAR | Status: AC
  Administered 2022-06-14: 25 mg via INTRAVENOUS
  Filled 2022-06-14: qty 1

## 2022-06-14 NOTE — Progress Notes (Signed)
[facility] Progress Note  Patient: Katie [name]    LGX:[id number]  DOA: 06/12/2022     Date of Service: the patient was seen and examined on 06/14/2022  Chief Complaint  Patient presents with   Dizziness   Hypotension   Brief hospital course: Amorita Vanrossum is a 67 y.o. female with medical history significant of ESRD on HD (T, Th, Sa), hypertension, who presents to the ED due to dizziness for 1 wk, associated with chills, abdominal cramps, nausea and vomiting, and diarrhea.  She states she has numerous diarrheal episodes per day but it has been nonbloody and nonmelanotic.  She states she has vomited daily but her last episode was yesterday.  Due to the symptoms, she endorses poor p.o. intake.  In addition, she has missed dialysis for the last 3 sessions due to difficulty with transportation and her current symptoms.  Today, when she went for dialysis, she noticed rapid onset dizziness and near syncope.  She denies loss of consciousness.  Due to this, EMS was called.  She denies any chest pain, shortness of breath, cough, lower extremity edema.   ED course: On arrival to the ED, patient was initially hypotensive at 99/57 with heart rate of 63.  She was saturating at 100% on room air.  Orthostatic vital signs were obtained that demonstrated a marked 44 drop in systolic blood pressure with BP of 62/39 when standing.  Patient received 1 L of normal saline.  Despite this, patient continued to be symptomatic.  [facility] contacted for admission.  Nephrology consulted.   Assessment and Plan: # Orthostatic hypotension Significantly symptomatic orthostatic hypotension in the setting of a diarrheal illness and ongoing antihypertensive use.  She appears hypovolemic on examination.   S/p 1 L of NS in the ED and  1 L during dialysis on 1/30  S/p Midodrine 5 mg once prior to dialysis, started midodrine 5 mg p.o. 3 times daily with holding parameters Continue to hold antihypertensive medications Monitor  orthostatic vital signs daily.    Acute gastroenteritis secondary to Sapa virus infection  Patient presented with 1 week of abdominal cramps, vomiting and diarrhea.  CT of the abdomen with no evidence of complications.  GI pathogen positive for Sabo virus, C. difficile negative - Zofran as needed S/p IV fluids as noted above 2/1 patient had 1 episode of diarrhea today, improving.   ESRD (end stage renal disease) on dialysis  - Nephrology consulted; continue HD TTS schedule    Type 2 diabetes mellitus  Patient is not currently on any antiglycemic agents at home. -No indication for SSI at this time   Hypertension Patient is on amlodipine and carvedilol at home.   Given that she chronically becomes dizzy during dialysis with an acute exacerbation at this time, I suspect that she may require a higher blood pressure to allow for hemodynamic changes that will occur during dialysis - Hold home Coreg and amlodipine   Body mass index is 23.48 kg/m.  Interventions:   Pressure Injury Buttocks (Active)     Location: Buttocks  Location Orientation:   Staging:   Wound Description (Comments):   Present on Admission:      Diet: Regular diet DVT Prophylaxis: Subcutaneous Heparin    Advance goals of care discussion: DNR  Family Communication: family was not present at bedside, at the time of interview.  The pt provided permission to discuss medical plan with the family. Opportunity was given to ask question and all questions were answered satisfactorily.   Disposition:  Pt is from ALF, admitted with dizziness, still has orthostatic hypotension, which precludes a safe discharge. Discharge to ALF, when clinically stable, may need 1-2 more days to improve.  Subjective: No significant events overnight, patient had diarrhea 5-10 episodes yesterday and one episode today.  No nausea vomiting.  Patient is feeling little bit of dizziness while turning the head and getting up and walk, still  feels that she is prone to feel dizzy while walking and does not feel comfortable to be discharged today. We will continue to monitor today and plan for disposition tomorrow a.m.    Physical Exam: General: NAD, lying comfortably, seen during HD Appear in no distress, affect appropriate Eyes: PERRLA ENT: Oral Mucosa Clear, moist  Neck: no JVD,  Cardiovascular: S1 and S2 Present, no Murmur,  Respiratory: good respiratory effort, Bilateral Air entry equal and Decreased, no Crackles, no wheezes Abdomen: Bowel Sound present, Soft and no tenderness,  Skin: no rashes Extremities: no Pedal edema, no calf tenderness Neurologic: without any new focal findings Gait not checked due to patient safety concerns  Vitals:   06/14/22 1100 06/14/22 1130 06/14/22 1200 06/14/22 1222  BP: (!) 141/74 138/65 119/62   Pulse: 68 66 65 75  Resp: 12 14 15 16   Temp:      TempSrc:      SpO2: 100% 100% 100%   Weight:      Height:        Intake/Output Summary (Last 24 hours) at 06/14/2022 1232 Last data filed at 06/13/2022 2100 Gross per 24 hour  Intake 495.19 ml  Output --  Net 495.19 ml   Filed Weights   06/12/22 1630 06/12/22 2130 06/14/22 1035  Weight: 65.7 kg 66 kg 65.6 kg    Data Reviewed: I have personally reviewed and interpreted daily labs, tele strips, imagings as discussed above. I reviewed all nursing notes, pharmacy notes, vitals, pertinent old records I have discussed plan of care as described above with RN and patient/family.  CBC: Recent Labs  Lab 06/09/22 0849 06/12/22 0733 06/14/22 0524  WBC 5.6 8.5 6.4  NEUTROABS 4.2 7.1  --   HGB 11.9* 12.3 10.3*  HCT 38.7 39.9 32.4*  MCV 100.3* 99.8 97.6  PLT 231 233 903   Basic Metabolic Panel: Recent Labs  Lab 06/09/22 0849 06/12/22 0733 06/13/22 0425 06/13/22 0904 06/14/22 0524  NA 134* 134* 135  --  135  K 3.4* 3.2* 3.1*  --  4.2  CL 100 100 100  --  104  CO2 20* 20* 26  --  24  GLUCOSE 88 73 87  --  94  BUN 44* 44* 21   --  31*  CREATININE 6.75* 6.56* 3.65*  --  4.71*  CALCIUM 8.5* 8.1* 7.7*  --  7.9*  MG  --   --   --  1.9 1.9  PHOS  --   --  2.6  --  3.4    Studies: No results found.  Scheduled Meds:  Chlorhexidine Gluconate Cloth  6 each Topical Q0600   diphenhydrAMINE  25 mg Intravenous Q T,Th,Sa-HD   divalproex  125 mg Oral QHS   heparin  5,000 Units Subcutaneous Q8H   midodrine  5 mg Oral TID WC   oxyCODONE  5 mg Oral Once   sertraline  25 mg Oral Daily   traZODone  50 mg Oral QHS   Continuous Infusions: PRN Meds: acetaminophen **OR** acetaminophen, albuterol, heparin, ondansetron **OR** ondansetron (ZOFRAN) IV, oxyCODONE  Time spent: 35  minutes  Author: Val Riles. MD [facility] 06/14/2022 12:32 PM  To reach On-call, see care teams to locate the attending and reach out to them via [email or web address]. If 7PM-7AM, please contact night-coverage If you still have difficulty reaching the attending provider, please page the [facility] (Director on Call) for [facility] on amion for assistance.

## 2022-06-14 NOTE — Evaluation (Signed)
Physical Therapy Evaluation Patient Details Name: Katie [name] MRN: [id number] DOB: 11-11-1955 Today's Date: 06/14/2022  History of Present Illness  Jenna Routzahn is a 36yoF who comes to [facility] on 1/27 for evaluation of hypotension in the setting of N/V/D x1 week x 1 week, DC from ED back to [facility] same date. Pt returns 1/30 with similar symptoms hypotension/dizziness 30 minutes post HD start, BP 64/37 mmHg. PMH: ESRD on HD TTS, DM, HTN, frequent falls, recurrent syncope, orthostatic hypotension on midodrine 5mg  TID, dog bite cellulitis 2023.  Clinical Impression  Pt admitted c above Dx. Pt shows functional limitations due to the deficits listed below (see "PT Problem List"). Patient agreeable to PT evaluation. PLOF and home setup obtained. Pt able to perform bed mobility, transfers, and AMB with greater than typical effort and pain, but no physical assistance needed. Pt feeling better, but remains off baseline. Her orthostatic dizziness is not a problem this session when AMB. Patient's assessment reveals acute need for additional person for safety and/or physical assistance to complete their typical ADL. At baseline, the patient is able to perform ADL with modified independence. Patient will benefit from skilled PT intervention to maximize independence and safety in mobility required for basic ADL performance at discharge.          Recommendations for follow up therapy are one component of a multi-disciplinary discharge planning process, led by the attending physician.  Recommendations may be updated based on patient status, additional functional criteria and insurance authorization.  Follow Up Recommendations Home health PT      Assistance Recommended at Discharge Set up Supervision/Assistance  Patient can return home with the following  A little help with walking and/or transfers;A little help with bathing/dressing/bathroom;Assist for transportation    Equipment Recommendations None  recommended by PT  Recommendations for Other Services       Functional Status Assessment Patient has had a recent decline in their functional status and demonstrates the ability to make significant improvements in function in a reasonable and predictable amount of time.     Precautions / Restrictions Precautions Precautions: Fall Restrictions Weight Bearing Restrictions: No      Mobility  Bed Mobility Overal bed mobility: Modified Independent                  Transfers Overall transfer level: Modified independent Equipment used: Rolling walker (2 wheels)                    Ambulation/Gait Ambulation/Gait assistance: Min guard Gait Distance (Feet): 40 Feet Assistive device: Rolling walker (2 wheels) Gait Pattern/deviations: Step-to pattern       General Gait Details: slow, cautious, asks for minGuard Assist when turning around at doorway  Stairs            Wheelchair Mobility    Modified Rankin (Stroke Patients Only)       Balance                                             Pertinent Vitals/Pain Pain Assessment Pain Assessment: 0-10 Pain Score: 6  Pain Location: all over including ABD cramping and HA Pain Descriptors / Indicators: Aching, Sore, Cramping Pain Intervention(s): Limited activity within patient's tolerance, Monitored during session, Premedicated before session, Repositioned, Patient requesting pain meds-RN notified    Home Living Family/patient expects to be discharged to:: Assisted living  Home Equipment: Conservation officer, nature (2 wheels);Shower seat;Hand held shower head Additional Comments: Pt reports she has been living at ALF for several months. walk in shower    Prior Function Prior Level of Function : Independent/Modified Independent             Mobility Comments: Mod I using RW. Able to amb in room, to bathroom, and dining hall. No falls. No longer has her rollator which she  prefers. ADLs Comments: Mod I for ADLs, shower seat for bathing, facility provides meals, does not drive.     Hand Dominance   Dominant Hand: Right    Extremity/Trunk Assessment                Communication   Communication: No difficulties  Cognition                                                General Comments      Exercises     Assessment/Plan    PT Assessment Patient needs continued PT services  PT Problem List Decreased strength;Decreased activity tolerance;Decreased balance;Decreased mobility       PT Treatment Interventions DME instruction;Gait training;Stair training;Functional mobility training;Therapeutic activities;Therapeutic exercise;Balance training;Neuromuscular re-education;Patient/family education    PT Goals (Current goals can be found in the Care Plan section)  Acute Rehab PT Goals Patient Stated Goal: Feel better, and leave hospital PT Goal Formulation: With patient Time For Goal Achievement: 06/28/22 Potential to Achieve Goals: Good    Frequency Min 2X/week     Co-evaluation               AM-PAC PT "6 Clicks" Mobility  Outcome Measure Help needed turning from your back to your side while in a flat bed without using bedrails?: None Help needed moving from lying on your back to sitting on the side of a flat bed without using bedrails?: None Help needed moving to and from a bed to a chair (including a wheelchair)?: A Little Help needed standing up from a chair using your arms (e.g., wheelchair or bedside chair)?: A Little Help needed to walk in hospital room?: A Little Help needed climbing 3-5 steps with a railing? : A Little 6 Click Score: 20    End of Session   Activity Tolerance: Patient tolerated treatment well;No increased pain;Patient limited by fatigue Patient left: in bed;with call bell/phone within reach;with nursing/sitter in room Nurse Communication: Mobility status PT Visit Diagnosis:  Unsteadiness on feet (R26.81);Difficulty in walking, not elsewhere classified (R26.2);Other abnormalities of gait and mobility (R26.89)    Time: 4970-2637 PT Time Calculation (min) (ACUTE ONLY): 16 min   Charges:   PT Evaluation $PT Eval Moderate Complexity: 1 Mod         10:16 AM, 06/14/22 Etta Grandchild, PT, DPT Physical Therapist - [facility]  [phone] ([place])    [place] C 06/14/2022, 10:15 AM

## 2022-06-14 NOTE — Progress Notes (Signed)
[facility] [place], [name] 06/14/22  Subjective:   LOS: 1  Patient known to our practice from previous admissions.    Patient seen and evaluated during dialysis   HEMODIALYSIS FLOWSHEET:  Blood Flow Rate (mL/min): 400 mL/min Arterial Pressure (mmHg): -160 mmHg Venous Pressure (mmHg): 350 mmHg TMP (mmHg): 0 mmHg Ultrafiltration Rate (mL/min): 686 mL/min Dialysate Flow Rate (mL/min): 300 ml/min Dialysis Fluid Bolus: Normal Saline Bolus Amount (mL): 150 mL  Requesting dialysis premedications of Zofran and Benadryl to prevent itching Denies pain or discomfort  Objective:  Vital signs in last 24 hours:  Temp:  [98.1 F (36.7 C)-98.3 F (36.8 C)] 98.2 F (36.8 C) (02/01 1015) Pulse Rate:  [61-74] 68 (02/01 1100) Resp:  [12-21] 12 (02/01 1100) BP: (113-147)/(62-76) 141/74 (02/01 1100) SpO2:  [98 %-100 %] 100 % (02/01 1100) Weight:  [65.6 kg] 65.6 kg (02/01 1035)  Weight change:  Filed Weights   06/12/22 1630 06/12/22 2130 06/14/22 1035  Weight: 65.7 kg 66 kg 65.6 kg    Intake/Output:    Intake/Output Summary (Last 24 hours) at 06/14/2022 1118 Last data filed at 06/13/2022 2100 Gross per 24 hour  Intake 495.19 ml  Output --  Net 495.19 ml     Physical Exam: General:  No acute distress, laying in the bed  HEENT  anicteric, moist oral mucous membrane  Pulm/lungs  normal breathing effort, lungs are clear to auscultation  CVS/Heart  regular rhythm, no rub or gallop  Abdomen:   Soft, nontender  Extremities:  No peripheral edema  Neurologic:  Alert, oriented, able to follow commands  Skin:  No acute rashes  Left arm AV fistula.     Basic Metabolic Panel:  Recent Labs  Lab 06/09/22 0849 06/12/22 0733 06/13/22 0425 06/13/22 0904 06/14/22 0524  NA 134* 134* 135  --  135  K 3.4* 3.2* 3.1*  --  4.2  CL 100 100 100  --  104  CO2 20* 20* 26  --  24  GLUCOSE 88 73 87  --  94  BUN 44* 44* 21  --  31*  CREATININE 6.75* 6.56* 3.65*  --   4.71*  CALCIUM 8.5* 8.1* 7.7*  --  7.9*  MG  --   --   --  1.9 1.9  PHOS  --   --  2.6  --  3.4      CBC: Recent Labs  Lab 06/09/22 0849 06/12/22 0733 06/14/22 0524  WBC 5.6 8.5 6.4  NEUTROABS 4.2 7.1  --   HGB 11.9* 12.3 10.3*  HCT 38.7 39.9 32.4*  MCV 100.3* 99.8 97.6  PLT 231 233 177       Lab Results  Component Value Date   HEPBSAG NON REACTIVE 06/12/2022   HEPBSAB NON REACTIVE 11/04/2021   HEPBIGM NON REACTIVE 12/19/2021      Microbiology:  Recent Results (from the past 240 hour(s))  Blood Culture (routine x 2)     Status: None   Collection Time: 06/09/22  8:48 AM   Specimen: BLOOD  Result Value Ref Range Status   Specimen Description BLOOD BLOOD RIGHT HAND  Final   Special Requests   Final    BOTTLES DRAWN AEROBIC AND ANAEROBIC Blood Culture adequate volume   Culture   Final    NO GROWTH 5 DAYS Performed at [facility], [address], [place], [place] [address]    Report Status 06/14/2022 FINAL  Final  Blood Culture (routine x 2)  Status: Abnormal   Collection Time: 06/09/22 10:18 AM   Specimen: BLOOD  Result Value Ref Range Status   Specimen Description   Final    BLOOD RIGHT ARM Performed at [facility], [address], [place], [place] [address]    Special Requests   Final    BOTTLES DRAWN AEROBIC AND ANAEROBIC Blood Culture adequate volume Performed at [facility], [address] [place], [place] [address]    Culture  Setup Time   Final    GRAM POSITIVE COCCI IN BOTH AEROBIC AND ANAEROBIC BOTTLES CRITICAL RESULT CALLED TO, READ BACK BY AND VERIFIED WITH: Clent Ridges RN ED @ [phone] 06/10/22 LFD    Culture (A)  Final    STAPHYLOCOCCUS HOMINIS STAPHYLOCOCCUS EPIDERMIDIS THE SIGNIFICANCE OF ISOLATING THIS ORGANISM FROM A SINGLE SET OF BLOOD CULTURES WHEN MULTIPLE SETS ARE DRAWN IS UNCERTAIN. PLEASE NOTIFY THE MICROBIOLOGY DEPARTMENT WITHIN ONE WEEK IF SPECIATION AND SENSITIVITIES ARE REQUIRED. Performed at  [facility], [place] [address] [place], [place] [address]    Report Status 06/12/2022 FINAL  Final  Blood Culture ID Panel (Reflexed)     Status: Abnormal   Collection Time: 06/09/22 10:18 AM  Result Value Ref Range Status   Enterococcus faecalis NOT DETECTED NOT DETECTED Final   Enterococcus Faecium NOT DETECTED NOT DETECTED Final   Listeria monocytogenes NOT DETECTED NOT DETECTED Final   Staphylococcus species DETECTED (A) NOT DETECTED Final    Comment: CRITICAL RESULT CALLED TO, READ BACK BY AND VERIFIED WITH: Clent Ridges RN ED @ [phone] 06/10/22 LFD    Staphylococcus aureus (BCID) NOT DETECTED NOT DETECTED Final   Staphylococcus epidermidis DETECTED (A) NOT DETECTED Final    Comment: Methicillin (oxacillin) resistant coagulase negative staphylococcus. Possible blood culture contaminant (unless isolated from more than one blood culture draw or clinical case suggests pathogenicity). No antibiotic treatment is indicated for blood  culture contaminants. CRITICAL RESULT CALLED TO, READ BACK BY AND VERIFIED WITH: Clent Ridges RN ED @ [phone] 06/10/22 LFD    Staphylococcus lugdunensis NOT DETECTED NOT DETECTED Final   Streptococcus species NOT DETECTED NOT DETECTED Final   Streptococcus agalactiae NOT DETECTED NOT DETECTED Final   Streptococcus pneumoniae NOT DETECTED NOT DETECTED Final   Streptococcus pyogenes NOT DETECTED NOT DETECTED Final   A.calcoaceticus-baumannii NOT DETECTED NOT DETECTED Final   Bacteroides fragilis NOT DETECTED NOT DETECTED Final   Enterobacterales NOT DETECTED NOT DETECTED Final   Enterobacter cloacae complex NOT DETECTED NOT DETECTED Final   Escherichia coli NOT DETECTED NOT DETECTED Final   Klebsiella aerogenes NOT DETECTED NOT DETECTED Final   Klebsiella oxytoca NOT DETECTED NOT DETECTED Final   Klebsiella pneumoniae NOT DETECTED NOT DETECTED Final   Proteus species NOT DETECTED NOT DETECTED Final   Salmonella species NOT DETECTED NOT DETECTED Final   Serratia  marcescens NOT DETECTED NOT DETECTED Final   Haemophilus influenzae NOT DETECTED NOT DETECTED Final   Neisseria meningitidis NOT DETECTED NOT DETECTED Final   Pseudomonas aeruginosa NOT DETECTED NOT DETECTED Final   Stenotrophomonas maltophilia NOT DETECTED NOT DETECTED Final   Candida albicans NOT DETECTED NOT DETECTED Final   Candida auris NOT DETECTED NOT DETECTED Final   Candida glabrata NOT DETECTED NOT DETECTED Final   Candida krusei NOT DETECTED NOT DETECTED Final   Candida parapsilosis NOT DETECTED NOT DETECTED Final   Candida tropicalis NOT DETECTED NOT DETECTED Final   Cryptococcus neoformans/gattii NOT DETECTED NOT DETECTED Final   Methicillin resistance mecA/C DETECTED (A) NOT DETECTED Final  Comment: CRITICAL RESULT CALLED TO, READ BACK BY AND VERIFIED WITH: Clent Ridges RN ED @ [phone] 06/10/22 LFD Performed at [facility], [address] [place], [place] [address]   Blood culture (routine x 2)     Status: None (Preliminary result)   Collection Time: 06/12/22  7:30 AM   Specimen: BLOOD RIGHT ARM  Result Value Ref Range Status   Specimen Description BLOOD RIGHT ARM  Final   Special Requests   Final    BOTTLES DRAWN AEROBIC AND ANAEROBIC Blood Culture adequate volume   Culture   Final    NO GROWTH 2 DAYS Performed at [facility], [address] [place], [place] [address]    Report Status PENDING  Incomplete  Resp panel by RT-PCR (RSV, Flu A&B, Covid) Anterior Nasal Swab     Status: None   Collection Time: 06/12/22  7:32 AM   Specimen: Anterior Nasal Swab  Result Value Ref Range Status   SARS Coronavirus 2 by RT PCR NEGATIVE NEGATIVE Final    Comment: (NOTE) SARS-CoV-2 target nucleic acids are NOT DETECTED.  The SARS-CoV-2 RNA is generally detectable in upper respiratory specimens during the acute phase of infection. The lowest concentration of SARS-CoV-2 viral copies this assay can detect is 138 copies/mL. A negative result does not preclude  SARS-Cov-2 infection and should not be used as the sole basis for treatment or other patient management decisions. A negative result may occur with  improper specimen collection/handling, submission of specimen other than nasopharyngeal swab, presence of viral mutation(s) within the areas targeted by this assay, and inadequate number of viral copies(<138 copies/mL). A negative result must be combined with clinical observations, patient history, and epidemiological information. The expected result is Negative.  Fact Sheet for Patients:  EntrepreneurPulse.com.au  Fact Sheet for Healthcare Providers:  IncredibleEmployment.be  This test is no t yet approved or cleared by the [place] FDA and  has been authorized for detection and/or diagnosis of SARS-CoV-2 by FDA under an Emergency Use Authorization (EUA). This EUA will remain  in effect (meaning this test can be used) for the duration of the COVID-19 declaration under Section 564(b)(1) of the Act, 21 U.S.C.section 360bbb-3(b)(1), unless the authorization is terminated  or revoked sooner.       Influenza A by PCR NEGATIVE NEGATIVE Final   Influenza B by PCR NEGATIVE NEGATIVE Final    Comment: (NOTE) The Xpert Xpress SARS-CoV-2/FLU/RSV plus assay is intended as an aid in the diagnosis of influenza from Nasopharyngeal swab specimens and should not be used as a sole basis for treatment. Nasal washings and aspirates are unacceptable for Xpert Xpress SARS-CoV-2/FLU/RSV testing.  Fact Sheet for Patients: EntrepreneurPulse.com.au  Fact Sheet for Healthcare Providers: IncredibleEmployment.be  This test is not yet approved or cleared by the [place] FDA and has been authorized for detection and/or diagnosis of SARS-CoV-2 by FDA under an Emergency Use Authorization (EUA). This EUA will remain in effect (meaning this test can be used) for the duration of  the COVID-19 declaration under Section 564(b)(1) of the Act, 21 U.S.C. section 360bbb-3(b)(1), unless the authorization is terminated or revoked.     Resp Syncytial Virus by PCR NEGATIVE NEGATIVE Final    Comment: (NOTE) Fact Sheet for Patients: EntrepreneurPulse.com.au  Fact Sheet for Healthcare Providers: IncredibleEmployment.be  This test is not yet approved or cleared by the [place] FDA and has been authorized for detection and/or diagnosis of SARS-CoV-2 by FDA under an Emergency Use Authorization (EUA). This EUA will  remain in effect (meaning this test can be used) for the duration of the COVID-19 declaration under Section 564(b)(1) of the Act, 21 U.S.C. section 360bbb-3(b)(1), unless the authorization is terminated or revoked.  Performed at [facility], [address] [place], [place] [address]   Blood culture (routine x 2)     Status: None (Preliminary result)   Collection Time: 06/12/22  9:59 PM   Specimen: BLOOD RIGHT HAND  Result Value Ref Range Status   Specimen Description BLOOD RIGHT HAND  Final   Special Requests   Final    BOTTLES DRAWN AEROBIC AND ANAEROBIC Blood Culture adequate volume   Culture   Final    NO GROWTH 2 DAYS Performed at [facility], [address], [place], [place] [address]    Report Status PENDING  Incomplete  Gastrointestinal Panel by PCR , Stool     Status: Abnormal   Collection Time: 06/14/22  2:07 AM   Specimen: STOOL  Result Value Ref Range Status   Campylobacter species NOT DETECTED NOT DETECTED Final   Plesimonas shigelloides NOT DETECTED NOT DETECTED Final   Salmonella species NOT DETECTED NOT DETECTED Final   Yersinia enterocolitica NOT DETECTED NOT DETECTED Final   Vibrio species NOT DETECTED NOT DETECTED Final   Vibrio cholerae NOT DETECTED NOT DETECTED Final   Enteroaggregative E coli (EAEC) NOT DETECTED NOT DETECTED Final   Enteropathogenic E coli (EPEC) NOT  DETECTED NOT DETECTED Final   Enterotoxigenic E coli (ETEC) NOT DETECTED NOT DETECTED Final   Shiga like toxin producing E coli (STEC) NOT DETECTED NOT DETECTED Final   Shigella/Enteroinvasive E coli (EIEC) NOT DETECTED NOT DETECTED Final   Cryptosporidium NOT DETECTED NOT DETECTED Final   Cyclospora cayetanensis NOT DETECTED NOT DETECTED Final   Entamoeba histolytica NOT DETECTED NOT DETECTED Final   Giardia lamblia NOT DETECTED NOT DETECTED Final   Adenovirus F40/41 NOT DETECTED NOT DETECTED Final   Astrovirus NOT DETECTED NOT DETECTED Final   Norovirus GI/GII NOT DETECTED NOT DETECTED Final   Rotavirus A NOT DETECTED NOT DETECTED Final   Sapovirus (I, II, IV, and V) DETECTED (A) NOT DETECTED Final    Comment: Performed at [facility], [address] [place], [place] [address]  C Difficile Quick Screen w PCR reflex     Status: None   Collection Time: 06/14/22  2:07 AM   Specimen: STOOL  Result Value Ref Range Status   C Diff antigen NEGATIVE NEGATIVE Final   C Diff toxin NEGATIVE NEGATIVE Final   C Diff interpretation No C. difficile detected.  Final    Comment: Performed at [facility], [address] [place], [place] [address]    Coagulation Studies: No results for input(s): "LABPROT", "INR" in the last 72 hours.  Urinalysis: No results for input(s): "COLORURINE", "LABSPEC", "PHURINE", "GLUCOSEU", "HGBUR", "BILIRUBINUR", "KETONESUR", "PROTEINUR", "UROBILINOGEN", "NITRITE", "LEUKOCYTESUR" in the last 72 hours.  Invalid input(s): "APPERANCEUR"    Imaging: CT HEAD WO CONTRAST (5MM)  Result Date: 06/12/2022 CLINICAL DATA:  Ataxia, hypotension and dizziness EXAM: CT HEAD WITHOUT CONTRAST CT CERVICAL SPINE WITHOUT CONTRAST TECHNIQUE: Multidetector CT imaging of the head and cervical spine was performed following the standard protocol without intravenous contrast. Multiplanar CT image reconstructions of the cervical spine were also generated. RADIATION DOSE  REDUCTION: This exam was performed according to the departmental dose-optimization program which includes automated exposure control, adjustment of the mA and/or kV according to patient size and/or use of iterative reconstruction technique. COMPARISON:  03/07/2021  FINDINGS: CT HEAD FINDINGS Brain: No evidence of acute infarction, hemorrhage, hydrocephalus, extra-axial collection or mass lesion/mass effect. There is mild diffuse low-attenuation within the subcortical and periventricular white matter compatible with chronic microvascular disease. Vascular: No hyperdense vessel or unexpected calcification. Skull: Normal. Negative for fracture or focal lesion. Sinuses/Orbits: Paranasal sinuses and mastoid air cells are clear. Other: None CT CERVICAL SPINE FINDINGS Alignment: Normal slight reversal wall of normal cervical lordosis. No acute posttraumatic malalignment of the cervical spine. Skull base and vertebrae: No acute fracture. No primary bone lesion or focal pathologic process. Soft tissues and spinal canal: No prevertebral fluid or swelling. No visible canal hematoma. Disc levels: There is degenerative disc disease identified the C5-6 level. New Schmorl's node deformity is identified involving the superior endplates of C6 and C7. Upper chest: Negative. Other: None IMPRESSION: 1. No acute intracranial abnormality. 2. Chronic microvascular disease. 3. No evidence for cervical spine fracture or subluxation. 4. Cervical degenerative disc disease. Electronically Signed   By: Kerby Moors M.D.   On: 06/12/2022 12:15   CT Cervical Spine Wo Contrast  Result Date: 06/12/2022 CLINICAL DATA:  Ataxia, hypotension and dizziness EXAM: CT HEAD WITHOUT CONTRAST CT CERVICAL SPINE WITHOUT CONTRAST TECHNIQUE: Multidetector CT imaging of the head and cervical spine was performed following the standard protocol without intravenous contrast. Multiplanar CT image reconstructions of the cervical spine were also generated.  RADIATION DOSE REDUCTION: This exam was performed according to the departmental dose-optimization program which includes automated exposure control, adjustment of the mA and/or kV according to patient size and/or use of iterative reconstruction technique. COMPARISON:  03/07/2021 FINDINGS: CT HEAD FINDINGS Brain: No evidence of acute infarction, hemorrhage, hydrocephalus, extra-axial collection or mass lesion/mass effect. There is mild diffuse low-attenuation within the subcortical and periventricular white matter compatible with chronic microvascular disease. Vascular: No hyperdense vessel or unexpected calcification. Skull: Normal. Negative for fracture or focal lesion. Sinuses/Orbits: Paranasal sinuses and mastoid air cells are clear. Other: None CT CERVICAL SPINE FINDINGS Alignment: Normal slight reversal wall of normal cervical lordosis. No acute posttraumatic malalignment of the cervical spine. Skull base and vertebrae: No acute fracture. No primary bone lesion or focal pathologic process. Soft tissues and spinal canal: No prevertebral fluid or swelling. No visible canal hematoma. Disc levels: There is degenerative disc disease identified the C5-6 level. New Schmorl's node deformity is identified involving the superior endplates of C6 and C7. Upper chest: Negative. Other: None IMPRESSION: 1. No acute intracranial abnormality. 2. Chronic microvascular disease. 3. No evidence for cervical spine fracture or subluxation. 4. Cervical degenerative disc disease. Electronically Signed   By: Kerby Moors M.D.   On: 06/12/2022 12:15   CT ABDOMEN PELVIS WO CONTRAST  Result Date: 06/12/2022 CLINICAL DATA:  Abdominal pain, dialysis patient with hypotension and dizziness after 30 minutes of dialysis, diarrhea for the past day, history hypertension and end-stage renal disease EXAM: CT ABDOMEN AND PELVIS WITHOUT CONTRAST TECHNIQUE: Multidetector CT imaging of the abdomen and pelvis was performed following the standard  protocol without IV contrast. RADIATION DOSE REDUCTION: This exam was performed according to the departmental dose-optimization program which includes automated exposure control, adjustment of the mA and/or kV according to patient size and/or use of iterative reconstruction technique. COMPARISON:  06/09/2022 FINDINGS: Lower chest: Emphysematous changes with complete atelectasis of LEFT lower lobe and dependent atelectasis of posterior RIGHT lower lobe. Trace pericardial effusion. Hepatobiliary: Dependent calculi within gallbladder. Gallbladder and liver otherwise normal appearance. Pancreas: Atrophic pancreas without mass Spleen: Normal appearance Adrenals/Urinary Tract: Thickening  of adrenal glands bilaterally without discrete mass. Renal vascular calcifications. No renal mass or hydronephrosis. Ureters unremarkable. Bladder decompressed. Stomach/Bowel: Appendix not visualized. Sigmoid diverticulosis. Stomach and bowel loops otherwise normal appearance for technique Vascular/Lymphatic: Extensive atherosclerotic calcifications. Aorta normal caliber. No adenopathy. Reproductive: Uterus and adnexa unremarkable Other: No free air or free fluid. No hernia or inflammatory process. Musculoskeletal: Osseous demineralization. IMPRESSION: Cholelithiasis. Sigmoid diverticulosis without evidence of diverticulitis. Bibasilar atelectasis greater on LEFT with complete atelectasis of LEFT lower lobe. Trace pericardial effusion. No acute intra-abdominal or intrapelvic abnormalities. Electronically Signed   By: Lavonia Dana M.D.   On: 06/12/2022 12:02     Medications:      Chlorhexidine Gluconate Cloth  6 each Topical Q0600   diphenhydrAMINE  25 mg Intravenous Q T,Th,Sa-HD   divalproex  125 mg Oral QHS   heparin  5,000 Units Subcutaneous Q8H   midodrine  5 mg Oral TID WC   oxyCODONE  5 mg Oral Once   sertraline  25 mg Oral Daily   traZODone  50 mg Oral QHS   acetaminophen **OR** acetaminophen, albuterol, heparin,  ondansetron **OR** ondansetron (ZOFRAN) IV, oxyCODONE  Assessment/ Plan:  67 y.o. female with medical problems of end-stage renal disease, HD, diabetes, hypertension, history of left arm cellulitis in August 2023 was admitted on 06/12/2022 for  Principal Problem:   Orthostatic hypotension Active Problems:   Hypertension   Type 2 diabetes mellitus (HCC)   ESRD (end stage renal disease) on dialysis (HCC)   Vomiting and diarrhea  Orthostatic hypotension [I95.1] ESRD (end stage renal disease) ([place]) [N18.6] Pre-syncope [R55]  #. ESRD with hypotension [facility] dialysis catheter/TTS/left forearm AV graft Scheduled to receive dialysis today, UF goal 1.5 L as tolerated.  Next treatment scheduled for Saturday.  Blood pressure 164/66 during dialysis currently.  Patient was started on midodrine 5 mg 3 times daily.  Will order diphenhydramine 25 mg IV with dialysis treatments.  #. Anemia of CKD  Lab Results  Component Value Date   HGB 10.3 (L) 06/14/2022   Hemoglobin currently acceptable, no need for ESA's at this time.  Will continue to monitor.  #. Secondary hyperparathyroidism of renal origin N 25.81      Component Value Date/Time   PTH 70 (H) 05/12/2020 0946   Lab Results  Component Value Date   PHOS 3.4 06/14/2022   Calcium currently 7.9.  Phosphorus is acceptable.  No need for binders at this time.   #.  Orthostatic hypotension. Antihypertensives placed on hold.  Midodrine scheduled 3 times daily.   LOS: [place] 2/1/202411:18 [place], [place]

## 2022-06-14 NOTE — Progress Notes (Signed)
Pt ran for 3.5hrs 1.5L removed    06/14/22 1452  Vitals  Temp 98.2 F (36.8 C)  Temp Source Oral  BP 122/63  MAP (mmHg) 80  BP Location Left Arm  BP Method Automatic  Patient Position (if appropriate) Lying  Pulse Rate 67  Pulse Rate Source Monitor  ECG Heart Rate 68  Resp 14  Oxygen Therapy  SpO2 100 %  O2 Device Room Air  Patient Activity (if Appropriate) In bed  Pulse Oximetry Type Continuous  Post Treatment  Dialyzer Clearance Lightly streaked  Duration of HD Treatment -hour(s) 3.5 hour(s)  Liters Processed 71.9  Fluid Removed (mL) 1500 mL  Tolerated HD Treatment Yes  Post-Hemodialysis Comments HD complete. Tolerated well  AVG/AVF Arterial Site Held (minutes) 10 minutes  AVG/AVF Venous Site Held (minutes) 10 minutes  Note  Observations Tx ended  Fistula / Graft Left Upper arm Arteriovenous fistula  Placement Date/Time: 03/20/21 1700   Placed prior to admission: Yes  Orientation: Left  Access Location: Upper arm  Access Type: Arteriovenous fistula  Site Condition No complications  Fistula / Graft Assessment Present;Thrill;Bruit  Status Deaccessed  Drainage Description None

## 2022-06-14 NOTE — TOC Progression Note (Addendum)
Transition of Care [facility]) - Progression Note    Patient Details  Name: Katie [name] MRN: [id number] Date of Birth: 1956-01-21  Transition of Care [facility]) CM/SW Contact  Beverly Sessions, RN Phone Number: 06/14/2022, 1:32 PM  Clinical Narrative:     Confirmed with Anderson Malta at [facility] that they can accept patient back tomorrow, but it has to be before 1 PM. [facility] will provide transport  Anderson Malta again confirms that Fl2 and assessment are not needed.  Therapy recommending home health.  Patient in agreement and states she does not have a preference of home health agency. Per Anderson Malta at [facility] their preference is [facility].  Referral made and accepted by [place] at [facility].   Progress note, PT note and home health orders secure emailed to [place] at [facility] [facility]   Per [place] [facility] [place].            Expected Discharge Plan and Services                                               Social Determinants of Health (SDOH) Interventions SDOH Screenings   Tobacco Use: Medium Risk (03/27/2022)    Readmission Risk Interventions    06/13/2022    4:02 PM 09/27/2021    1:44 PM 09/26/2021   12:46 PM  Readmission Risk Prevention Plan  Transportation Screening Complete  Complete  Palliative Care Screening  Not Applicable   Medication Review (RN Care Manager) Complete  Complete  HRI or Home Care Consult Complete    SW Recovery Care/Counseling Consult   Complete  Palliative Care Screening Not Applicable  Not [place] Not Applicable  Not Applicable

## 2022-06-14 NOTE — Plan of Care (Signed)

## 2022-06-15 ENCOUNTER — Encounter: Payer: Self-pay | Admitting: Student

## 2022-06-15 DIAGNOSIS — I951 Orthostatic hypotension: Secondary | ICD-10-CM | POA: Diagnosis not present

## 2022-06-15 LAB — MISC LABCORP TEST (SEND OUT): Labcorp test code: 81950

## 2022-06-15 MED ORDER — MIDODRINE HCL 5 MG PO TABS
10.0000 mg | ORAL_TABLET | ORAL | Status: DC
  Administered 2022-06-16: 10 mg via ORAL
  Filled 2022-06-15: qty 2

## 2022-06-15 NOTE — TOC Progression Note (Signed)
Transition of Care [facility]) - Progression Note    Patient Details  Name: Katie [name] MRN: [id number] Date of Birth: 1955/10/28  Transition of Care [facility]) CM/SW [place], LCSW Phone Number: 06/15/2022, 10:39 AM  Clinical Narrative:    Per MD, patient not medically ready today- stable for DC tomorrow.  CSW called Anderson Malta at [facility] who stated patient would need to wait until Monday as she will need to come assess patient at bedside Monday morning.  Updated RN, MD, and [facility] Supervisor.         Expected Discharge Plan and Services                                               Social Determinants of Health (SDOH) Interventions SDOH Screenings   Food Insecurity: No Food Insecurity (06/14/2022)  Transportation Needs: No Transportation Needs (06/14/2022)  Utilities: Not At Risk (06/14/2022)  Tobacco Use: Medium Risk (03/27/2022)    Readmission Risk Interventions    06/13/2022    4:02 PM 09/27/2021    1:44 PM 09/26/2021   12:46 PM  Readmission Risk Prevention Plan  Transportation Screening Complete  Complete  Palliative Care Screening  Not Applicable   Medication Review (RN Care Manager) Complete  Complete  HRI or Home Care Consult Complete    SW Recovery Care/Counseling Consult   Complete  Palliative Care Screening Not Applicable  Not Alexis Not Applicable  Not Applicable

## 2022-06-15 NOTE — Plan of Care (Signed)
Patient AOX4, VSS throughout shift.  All meds given on time as ordered.  Diminished lungs, IS encouraged.  Pt denied pain and SOB.  POC maintained, will continue to monitor.  Problem: Education: Goal: Knowledge of General Education information will improve Description: Including pain rating scale, medication(s)/side effects and non-pharmacologic comfort measures Outcome: Progressing   Problem: Health Behavior/Discharge Planning: Goal: Ability to manage health-related needs will improve Outcome: Progressing   Problem: Clinical Measurements: Goal: Ability to maintain clinical measurements within normal limits will improve Outcome: Progressing Goal: Will remain free from infection Outcome: Progressing Goal: Diagnostic test results will improve Outcome: Progressing Goal: Respiratory complications will improve Outcome: Progressing Goal: Cardiovascular complication will be avoided Outcome: Progressing   Problem: Activity: Goal: Risk for activity intolerance will decrease Outcome: Progressing   Problem: Nutrition: Goal: Adequate nutrition will be maintained Outcome: Progressing   Problem: Coping: Goal: Level of anxiety will decrease Outcome: Progressing   Problem: Elimination: Goal: Will not experience complications related to bowel motility Outcome: Progressing Goal: Will not experience complications related to urinary retention Outcome: Progressing   Problem: Pain Managment: Goal: General experience of comfort will improve Outcome: Progressing   Problem: Safety: Goal: Ability to remain free from injury will improve Outcome: Progressing   Problem: Skin Integrity: Goal: Risk for impaired skin integrity will decrease Outcome: Progressing

## 2022-06-15 NOTE — Progress Notes (Signed)
[facility] Progress Note  Patient: Katie [name]    NOM:[id number]  DOA: 06/12/2022     Date of Service: the patient was seen and examined on 06/15/2022  Chief Complaint  Patient presents with   Dizziness   Hypotension   Brief hospital course: Katie [name] is a 67 y.o. female with medical history significant of ESRD on HD (T, Th, Sa), hypertension, who presents to the ED due to dizziness for 1 wk, associated with chills, abdominal cramps, nausea and vomiting, and diarrhea.  She states she has numerous diarrheal episodes per day but it has been nonbloody and nonmelanotic.  She states she has vomited daily but her last episode was yesterday.  Due to the symptoms, she endorses poor p.o. intake.  In addition, she has missed dialysis for the last 3 sessions due to difficulty with transportation and her current symptoms.  Today, when she went for dialysis, she noticed rapid onset dizziness and near syncope.  She denies loss of consciousness.  Due to this, EMS was called.  She denies any chest pain, shortness of breath, cough, lower extremity edema.   ED course: On arrival to the ED, patient was initially hypotensive at 99/57 with heart rate of 63.  She was saturating at 100% on room air.  Orthostatic vital signs were obtained that demonstrated a marked 44 drop in systolic blood pressure with BP of 62/39 when standing.  Patient received 1 L of normal saline.  Despite this, patient continued to be symptomatic.  TRH contacted for admission.  Nephrology consulted.   Assessment and Plan: # Orthostatic hypotension Significantly symptomatic orthostatic hypotension in the setting of a diarrheal illness and ongoing antihypertensive use.  She appears hypovolemic on examination.   S/p 1 L of NS in the ED and  1 L during dialysis on 1/30  S/p Midodrine 5 mg once prior to dialysis, s/p midodrine 5 mg p.o. TID w/holding parameters Continue to hold antihypertensive medications Monitor orthostatic vital signs  daily. 2/2 resting blood pressure noticed above 150 so discontinued midodrine.  Patient is still orthostatic hypotension, SBP dropped from 160-110 and patient was feeling lightheaded.    Acute gastroenteritis secondary to Sapovirus infection  Patient presented with 1 week of abdominal cramps, vomiting and diarrhea.  CT of the abdomen with no evidence of complications.  GI pathogen positive for Sabo virus, C. difficile negative - Zofran as needed S/p IV fluids as noted above 2/1 patient had 1 episode of diarrhea, improving. 2/2 no diarrhea today  ESRD (end stage renal disease) on dialysis  - Nephrology consulted; continue HD TTS schedule  2/2 started midodrine 10 mg p.o. every TTS hemodialysis days  Type 2 diabetes mellitus  Patient is not currently on any antiglycemic agents at home. -No indication for SSI at this time   Hypertension Patient is on amlodipine and carvedilol at home.   Given that she chronically becomes dizzy during dialysis with an acute exacerbation at this time, I suspect that she may require a higher blood pressure to allow for hemodynamic changes that will occur during dialysis - Hold home Coreg and amlodipine S/p midodrine 5 mg p.o. 3 times daily, DC'd on 2/2 due to higher resting blood pressure Patient still has orthostatic hypotension so we will continue to monitor.   Audible cardiac murmur Prior echo showed mild aortic stenosis Follow repeat 2D echocardiogram    Body mass index is 23.48 kg/m.  Interventions:   Pressure Injury Buttocks (Active)     Location: Buttocks  Location  Orientation:   Staging:   Wound Description (Comments):   Present on Admission:      Diet: Regular diet DVT Prophylaxis: Subcutaneous Heparin    Advance goals of care discussion: DNR  Family Communication: family was not present at bedside, at the time of interview.  The pt provided permission to discuss medical plan with the family. Opportunity was given to ask  question and all questions were answered satisfactorily.   Disposition:  Pt is from ALF, admitted with dizziness, still has orthostatic hypotension, which precludes a safe discharge. Discharge to ALF, when clinically stable, may need 1-2 more days to improve.  Subjective: No significant events overnight, patient had less than 5 episodes of diarrhea yesterday, no diarrhea today.  No nausea vomiting.  Patient still has orthostatic hypotension and feels lightheadedness and slight dizziness.  Patient did walk with RN, had hypoxia on exertion, recovered quickly.  Patient does not wear oxygen at home. Due to fluctuating blood pressure and symptomatic so we will continue to monitor today and patient will get hemodialysis done tomorrow a.m.  ALF will not accept patient over the weekend so we will plan to discharge her on Monday.  Physical Exam: General: NAD, lying comfortably, seen during HD Appear in no distress, affect appropriate Eyes: PERRLA ENT: Oral Mucosa Clear, moist  Neck: no JVD,  Cardiovascular: S1 and S2 Present,  Murmur audible,  Respiratory: good respiratory effort, Bilateral Air entry equal and Decreased, no Crackles, no wheezes Abdomen: Bowel Sound present, Soft and no tenderness,  Skin: no rashes Extremities: no Pedal edema, no calf tenderness Neurologic: without any new focal findings Gait not checked due to patient safety concerns  Vitals:   06/15/22 0550 06/15/22 0832 06/15/22 0843 06/15/22 0846  BP: (!) 154/79     Pulse: 73     Resp: 18     Temp: 98.3 F (36.8 C)     TempSrc: Oral     SpO2: 97% 98% (!) 84% 94%  Weight:      Height:        Intake/Output Summary (Last 24 hours) at 06/15/2022 1407 Last data filed at 06/14/2022 1452 Gross per 24 hour  Intake --  Output 1500 ml  Net -1500 ml   Filed Weights   06/12/22 2130 06/14/22 1035 06/14/22 1511  Weight: 66 kg 65.6 kg 64.5 kg    Data Reviewed: I have personally reviewed and interpreted daily labs, tele  strips, imagings as discussed above. I reviewed all nursing notes, pharmacy notes, vitals, pertinent old records I have discussed plan of care as described above with RN and patient/family.  CBC: Recent Labs  Lab 06/09/22 0849 06/12/22 0733 06/14/22 0524  WBC 5.6 8.5 6.4  NEUTROABS 4.2 7.1  --   HGB 11.9* 12.3 10.3*  HCT 38.7 39.9 32.4*  MCV 100.3* 99.8 97.6  PLT 231 233 188   Basic Metabolic Panel: Recent Labs  Lab 06/09/22 0849 06/12/22 0733 06/13/22 0425 06/13/22 0904 06/14/22 0524  NA 134* 134* 135  --  135  K 3.4* 3.2* 3.1*  --  4.2  CL 100 100 100  --  104  CO2 20* 20* 26  --  24  GLUCOSE 88 73 87  --  94  BUN 44* 44* 21  --  31*  CREATININE 6.75* 6.56* 3.65*  --  4.71*  CALCIUM 8.5* 8.1* 7.7*  --  7.9*  MG  --   --   --  1.9 1.9  PHOS  --   --  2.6  --  3.4    Studies: No results found.  Scheduled Meds:  Chlorhexidine Gluconate Cloth  6 each Topical Q0600   diphenhydrAMINE  25 mg Intravenous Q T,Th,Sa-HD   divalproex  125 mg Oral QHS   heparin  5,000 Units Subcutaneous Q8H   [START ON 06/16/2022] midodrine  10 mg Oral Q T,Th,Sa-HD   oxyCODONE  5 mg Oral Once   sertraline  25 mg Oral Daily   traZODone  50 mg Oral QHS   Continuous Infusions: PRN Meds: acetaminophen **OR** acetaminophen, albuterol, ondansetron **OR** ondansetron (ZOFRAN) IV, oxyCODONE  Time spent: 35 minutes  Author: Val Riles. MD [facility] 06/15/2022 2:07 PM  To reach On-call, see care teams to locate the attending and reach out to them via [email or web address]. If 7PM-7AM, please contact night-coverage If you still have difficulty reaching the attending provider, please page the [facility] (Director on Call) for [facility] on amion for assistance.

## 2022-06-15 NOTE — Progress Notes (Signed)
PT Cancellation Note  Patient Details Name: Katie [name] MRN: [id number] DOB: [birth year]   Cancelled Treatment:     PT treatment not completed 2/2 to pt refused. Pt said I am not in a good mood may be tomorrow. PT will attempt again tomorrow.   Joaquin Music PT DPT 2:41 PM,06/15/22

## 2022-06-15 NOTE — Progress Notes (Signed)
[facility], [name] 06/15/22  Subjective:   LOS: 2  Patient known to our practice from previous admissions.    Patient seen resting in bed Denies pain or discomfort Tolerated treatment well yesterday  Objective:  Vital signs in last 24 hours:  Temp:  [98.1 F (36.7 C)-98.7 F (37.1 C)] 98.3 F (36.8 C) (02/02 0550) Pulse Rate:  [65-76] 73 (02/02 0550) Resp:  [12-18] 18 (02/02 0550) BP: (94-154)/(54-79) 154/79 (02/02 0550) SpO2:  [84 %-100 %] 94 % (02/02 0846) Weight:  [64.5 kg] 64.5 kg (02/01 1511)  Weight change:  Filed Weights   06/12/22 2130 06/14/22 1035 06/14/22 1511  Weight: 66 kg 65.6 kg 64.5 kg    Intake/Output:    Intake/Output Summary (Last 24 hours) at 06/15/2022 1319 Last data filed at 06/14/2022 1452 Gross per 24 hour  Intake --  Output 1500 ml  Net -1500 ml     Physical Exam: General:  No acute distress, laying in the bed  HEENT  anicteric, moist oral mucous membrane  Pulm/lungs  normal breathing effort, lungs are clear to auscultation  CVS/Heart  regular rhythm, no rub or gallop  Abdomen:   Soft, nontender  Extremities:  No peripheral edema  Neurologic:  Alert, oriented, able to follow commands  Skin:  No acute rashes  Left arm AV fistula.     Basic Metabolic Panel:  Recent Labs  Lab 06/09/22 0849 06/12/22 0733 06/13/22 0425 06/13/22 0904 06/14/22 0524  NA 134* 134* 135  --  135  K 3.4* 3.2* 3.1*  --  4.2  CL 100 100 100  --  104  CO2 20* 20* 26  --  24  GLUCOSE 88 73 87  --  94  BUN 44* 44* 21  --  31*  CREATININE 6.75* 6.56* 3.65*  --  4.71*  CALCIUM 8.5* 8.1* 7.7*  --  7.9*  MG  --   --   --  1.9 1.9  PHOS  --   --  2.6  --  3.4      CBC: Recent Labs  Lab 06/09/22 0849 06/12/22 0733 06/14/22 0524  WBC 5.6 8.5 6.4  NEUTROABS 4.2 7.1  --   HGB 11.9* 12.3 10.3*  HCT 38.7 39.9 32.4*  MCV 100.3* 99.8 97.6  PLT 231 233 177       Lab Results  Component Value Date   HEPBSAG NON  REACTIVE 06/12/2022   HEPBSAB NON REACTIVE 11/04/2021   HEPBIGM NON REACTIVE 12/19/2021      Microbiology:  Recent Results (from the past 240 hour(s))  Blood Culture (routine x 2)     Status: None   Collection Time: 06/09/22  8:48 AM   Specimen: BLOOD  Result Value Ref Range Status   Specimen Description BLOOD BLOOD RIGHT HAND  Final   Special Requests   Final    BOTTLES DRAWN AEROBIC AND ANAEROBIC Blood Culture adequate volume   Culture   Final    NO GROWTH 5 DAYS Performed at [facility] [address], [place], [place] [address]    Report Status 06/14/2022 FINAL  Final  Blood Culture (routine x 2)     Status: Abnormal   Collection Time: 06/09/22 10:18 AM   Specimen: BLOOD  Result Value Ref Range Status   Specimen Description   Final    BLOOD RIGHT ARM Performed at [facility], [address] [place], [place] [address]    Special Requests   Final  BOTTLES DRAWN AEROBIC AND ANAEROBIC Blood Culture adequate volume Performed at [facility], [address] [place], [place] [address]    Culture  Setup Time   Final    GRAM POSITIVE COCCI IN BOTH AEROBIC AND ANAEROBIC BOTTLES CRITICAL RESULT CALLED TO, READ BACK BY AND VERIFIED WITH: Clent Ridges RN ED @ [phone] 06/10/22 LFD    Culture (A)  Final    STAPHYLOCOCCUS HOMINIS STAPHYLOCOCCUS EPIDERMIDIS THE SIGNIFICANCE OF ISOLATING THIS ORGANISM FROM A SINGLE SET OF BLOOD CULTURES WHEN MULTIPLE SETS ARE DRAWN IS UNCERTAIN. PLEASE NOTIFY THE MICROBIOLOGY DEPARTMENT WITHIN ONE WEEK IF SPECIATION AND SENSITIVITIES ARE REQUIRED. Performed at [facility], [place] [address], [place], [place] [address]    Report Status 06/12/2022 FINAL  Final  Blood Culture ID Panel (Reflexed)     Status: Abnormal   Collection Time: 06/09/22 10:18 AM  Result Value Ref Range Status   Enterococcus faecalis NOT DETECTED NOT DETECTED Final   Enterococcus Faecium NOT DETECTED NOT DETECTED Final   Listeria  monocytogenes NOT DETECTED NOT DETECTED Final   Staphylococcus species DETECTED (A) NOT DETECTED Final    Comment: CRITICAL RESULT CALLED TO, READ BACK BY AND VERIFIED WITH: Clent Ridges RN ED @ [phone] 06/10/22 LFD    Staphylococcus aureus (BCID) NOT DETECTED NOT DETECTED Final   Staphylococcus epidermidis DETECTED (A) NOT DETECTED Final    Comment: Methicillin (oxacillin) resistant coagulase negative staphylococcus. Possible blood culture contaminant (unless isolated from more than one blood culture draw or clinical case suggests pathogenicity). No antibiotic treatment is indicated for blood  culture contaminants. CRITICAL RESULT CALLED TO, READ BACK BY AND VERIFIED WITH: Clent Ridges RN ED @ [phone] 06/10/22 LFD    Staphylococcus lugdunensis NOT DETECTED NOT DETECTED Final   Streptococcus species NOT DETECTED NOT DETECTED Final   Streptococcus agalactiae NOT DETECTED NOT DETECTED Final   Streptococcus pneumoniae NOT DETECTED NOT DETECTED Final   Streptococcus pyogenes NOT DETECTED NOT DETECTED Final   A.calcoaceticus-baumannii NOT DETECTED NOT DETECTED Final   Bacteroides fragilis NOT DETECTED NOT DETECTED Final   Enterobacterales NOT DETECTED NOT DETECTED Final   Enterobacter cloacae complex NOT DETECTED NOT DETECTED Final   Escherichia coli NOT DETECTED NOT DETECTED Final   Klebsiella aerogenes NOT DETECTED NOT DETECTED Final   Klebsiella oxytoca NOT DETECTED NOT DETECTED Final   Klebsiella pneumoniae NOT DETECTED NOT DETECTED Final   Proteus species NOT DETECTED NOT DETECTED Final   Salmonella species NOT DETECTED NOT DETECTED Final   Serratia marcescens NOT DETECTED NOT DETECTED Final   Haemophilus influenzae NOT DETECTED NOT DETECTED Final   Neisseria meningitidis NOT DETECTED NOT DETECTED Final   Pseudomonas aeruginosa NOT DETECTED NOT DETECTED Final   Stenotrophomonas maltophilia NOT DETECTED NOT DETECTED Final   Candida albicans NOT DETECTED NOT DETECTED Final   Candida auris NOT  DETECTED NOT DETECTED Final   Candida glabrata NOT DETECTED NOT DETECTED Final   Candida krusei NOT DETECTED NOT DETECTED Final   Candida parapsilosis NOT DETECTED NOT DETECTED Final   Candida tropicalis NOT DETECTED NOT DETECTED Final   Cryptococcus neoformans/gattii NOT DETECTED NOT DETECTED Final   Methicillin resistance mecA/C DETECTED (A) NOT DETECTED Final    Comment: CRITICAL RESULT CALLED TO, READ BACK BY AND VERIFIED WITH: Clent Ridges RN ED @ [phone] 06/10/22 LFD Performed at [facility], [address] [place], [place] [address]   Blood culture (routine x 2)     Status: None (Preliminary result)   Collection Time: 06/12/22  7:30 AM   Specimen: BLOOD RIGHT ARM  Result Value Ref Range Status   Specimen Description BLOOD RIGHT ARM  Final   Special Requests   Final    BOTTLES DRAWN AEROBIC AND ANAEROBIC Blood Culture adequate volume   Culture   Final    NO GROWTH 3 DAYS Performed at [facility], [address] [place], [place] [address]    Report Status PENDING  Incomplete  Resp panel by RT-PCR (RSV, Flu A&B, Covid) Anterior Nasal Swab     Status: None   Collection Time: 06/12/22  7:32 AM   Specimen: Anterior Nasal Swab  Result Value Ref Range Status   SARS Coronavirus 2 by RT PCR NEGATIVE NEGATIVE Final    Comment: (NOTE) SARS-CoV-2 target nucleic acids are NOT DETECTED.  The SARS-CoV-2 RNA is generally detectable in upper respiratory specimens during the acute phase of infection. The lowest concentration of SARS-CoV-2 viral copies this assay can detect is 138 copies/mL. A negative result does not preclude SARS-Cov-2 infection and should not be used as the sole basis for treatment or other patient management decisions. A negative result may occur with  improper specimen collection/handling, submission of specimen other than nasopharyngeal swab, presence of viral mutation(s) within the areas targeted by this assay, and inadequate number of  viral copies(<138 copies/mL). A negative result must be combined with clinical observations, patient history, and epidemiological information. The expected result is Negative.  Fact Sheet for Patients:  EntrepreneurPulse.com.au  Fact Sheet for Healthcare Providers:  IncredibleEmployment.be  This test is no t yet approved or cleared by the [place] FDA and  has been authorized for detection and/or diagnosis of SARS-CoV-2 by FDA under an Emergency Use Authorization (EUA). This EUA will remain  in effect (meaning this test can be used) for the duration of the COVID-19 declaration under Section 564(b)(1) of the Act, 21 U.S.C.section 360bbb-3(b)(1), unless the authorization is terminated  or revoked sooner.       Influenza A by PCR NEGATIVE NEGATIVE Final   Influenza B by PCR NEGATIVE NEGATIVE Final    Comment: (NOTE) The Xpert Xpress SARS-CoV-2/FLU/RSV plus assay is intended as an aid in the diagnosis of influenza from Nasopharyngeal swab specimens and should not be used as a sole basis for treatment. Nasal washings and aspirates are unacceptable for Xpert Xpress SARS-CoV-2/FLU/RSV testing.  Fact Sheet for Patients: EntrepreneurPulse.com.au  Fact Sheet for Healthcare Providers: IncredibleEmployment.be  This test is not yet approved or cleared by the [place] FDA and has been authorized for detection and/or diagnosis of SARS-CoV-2 by FDA under an Emergency Use Authorization (EUA). This EUA will remain in effect (meaning this test can be used) for the duration of the COVID-19 declaration under Section 564(b)(1) of the Act, 21 U.S.C. section 360bbb-3(b)(1), unless the authorization is terminated or revoked.     Resp Syncytial Virus by PCR NEGATIVE NEGATIVE Final    Comment: (NOTE) Fact Sheet for Patients: EntrepreneurPulse.com.au  Fact Sheet for Healthcare  Providers: IncredibleEmployment.be  This test is not yet approved or cleared by the [place] FDA and has been authorized for detection and/or diagnosis of SARS-CoV-2 by FDA under an Emergency Use Authorization (EUA). This EUA will remain in effect (meaning this test can be used) for the duration of the COVID-19 declaration under Section 564(b)(1) of the Act, 21 U.S.C. section 360bbb-3(b)(1), unless the authorization is terminated or revoked.  Performed at [facility], [address], [place], [place] [address]   Blood culture (routine x  2)     Status: None (Preliminary result)   Collection Time: 06/12/22  9:59 PM   Specimen: BLOOD RIGHT HAND  Result Value Ref Range Status   Specimen Description BLOOD RIGHT HAND  Final   Special Requests   Final    BOTTLES DRAWN AEROBIC AND ANAEROBIC Blood Culture adequate volume   Culture   Final    NO GROWTH 3 DAYS Performed at [facility], [address] [place], [place] [address]    Report Status PENDING  Incomplete  Gastrointestinal Panel by PCR , Stool     Status: Abnormal   Collection Time: 06/14/22  2:07 AM   Specimen: STOOL  Result Value Ref Range Status   Campylobacter species NOT DETECTED NOT DETECTED Final   Plesimonas shigelloides NOT DETECTED NOT DETECTED Final   Salmonella species NOT DETECTED NOT DETECTED Final   Yersinia enterocolitica NOT DETECTED NOT DETECTED Final   Vibrio species NOT DETECTED NOT DETECTED Final   Vibrio cholerae NOT DETECTED NOT DETECTED Final   Enteroaggregative E coli (EAEC) NOT DETECTED NOT DETECTED Final   Enteropathogenic E coli (EPEC) NOT DETECTED NOT DETECTED Final   Enterotoxigenic E coli (ETEC) NOT DETECTED NOT DETECTED Final   Shiga like toxin producing E coli (STEC) NOT DETECTED NOT DETECTED Final   Shigella/Enteroinvasive E coli (EIEC) NOT DETECTED NOT DETECTED Final   Cryptosporidium NOT DETECTED NOT DETECTED Final   Cyclospora cayetanensis NOT  DETECTED NOT DETECTED Final   Entamoeba histolytica NOT DETECTED NOT DETECTED Final   Giardia lamblia NOT DETECTED NOT DETECTED Final   Adenovirus F40/41 NOT DETECTED NOT DETECTED Final   Astrovirus NOT DETECTED NOT DETECTED Final   Norovirus GI/GII NOT DETECTED NOT DETECTED Final   Rotavirus A NOT DETECTED NOT DETECTED Final   Sapovirus (I, II, IV, and V) DETECTED (A) NOT DETECTED Final    Comment: Performed at [facility] [place], [place] [address]  C Difficile Quick Screen w PCR reflex     Status: None   Collection Time: 06/14/22  2:07 AM   Specimen: STOOL  Result Value Ref Range Status   C Diff antigen NEGATIVE NEGATIVE Final   C Diff toxin NEGATIVE NEGATIVE Final   C Diff interpretation No C. difficile detected.  Final    Comment: Performed at [facility], [address] [place], [place] [address]    Coagulation Studies: No results for input(s): "LABPROT", "INR" in the last 72 hours.  Urinalysis: No results for input(s): "COLORURINE", "LABSPEC", "PHURINE", "GLUCOSEU", "HGBUR", "BILIRUBINUR", "KETONESUR", "PROTEINUR", "UROBILINOGEN", "NITRITE", "LEUKOCYTESUR" in the last 72 hours.  Invalid input(s): "APPERANCEUR"    Imaging: No results found.   Medications:      Chlorhexidine Gluconate Cloth  6 each Topical Q0600   diphenhydrAMINE  25 mg Intravenous Q T,Th,Sa-HD   divalproex  125 mg Oral QHS   heparin  5,000 Units Subcutaneous Q8H   [START ON 06/16/2022] midodrine  10 mg Oral Q T,Th,Sa-HD   oxyCODONE  5 mg Oral Once   sertraline  25 mg Oral Daily   traZODone  50 mg Oral QHS   acetaminophen **OR** acetaminophen, albuterol, ondansetron **OR** ondansetron (ZOFRAN) IV, oxyCODONE  Assessment/ Plan:  67 y.o. female with medical problems of end-stage renal disease, HD, diabetes, hypertension, history of left arm cellulitis in August 2023 was admitted on 06/12/2022 for  Principal Problem:   Orthostatic hypotension Active Problems:    Hypertension   Type 2 diabetes mellitus ([place])   ESRD (end  stage renal disease) on dialysis (HCC)   Vomiting and diarrhea  Orthostatic hypotension [I95.1] ESRD (end stage renal disease) ([place]) [N18.6] Pre-syncope [R55]  #. ESRD with hypotension [facility] dialysis catheter/TTS/left forearm AV graft Dialysis yesterday tolerated well, UF 1.5L achieved. Next treatment scheduled for Saturday. Blood pressure acceptable for this patient. Will continue Midodrine 10mg  with dialysis only.   #. Anemia of CKD  Lab Results  Component Value Date   HGB 10.3 (L) 06/14/2022   No need for ESA's at this time. Will continue to monitor renal function.  #. Secondary hyperparathyroidism of renal origin N 25.81      Component Value Date/Time   PTH 70 (H) 05/12/2020 0946   Lab Results  Component Value Date   PHOS 3.4 06/14/2022   Bone minerals stable at this time.    #.  Orthostatic hypotension. Antihypertensives placed on hold.  Scheduled midodrine held. Will continue Midodrine with dialysis.   LOS: [place] 2/2/20241:19 PM  [address] [place], [place]

## 2022-06-16 ENCOUNTER — Inpatient Hospital Stay: Admit: 2022-06-16 | Payer: Medicaid Other

## 2022-06-16 ENCOUNTER — Inpatient Hospital Stay
Admit: 2022-06-16 | Discharge: 2022-06-16 | Disposition: A | Discharge status: Home / Self Care | Payer: Medicaid Other | Attending: Student | Admitting: Student

## 2022-06-16 DIAGNOSIS — R011 Cardiac murmur, unspecified: Secondary | ICD-10-CM

## 2022-06-16 DIAGNOSIS — I951 Orthostatic hypotension: Secondary | ICD-10-CM

## 2022-06-16 LAB — BASIC METABOLIC PANEL
Anion gap: 7 (ref 5–15)
BUN: 41 mg/dL — ABNORMAL HIGH (ref 8–23)
CO2: 27 mmol/L (ref 22–32)
Calcium: 8 mg/dL — ABNORMAL LOW (ref 8.9–10.3)
Chloride: 101 mmol/L (ref 98–111)
Creatinine, Ser: 4.62 mg/dL — ABNORMAL HIGH (ref 0.44–1.00)
GFR, Estimated: 10 mL/min — ABNORMAL LOW (ref >60)
Glucose, Bld: 120 mg/dL — ABNORMAL HIGH (ref 70–99)
Potassium: 4.6 mmol/L (ref 3.5–5.1)
Sodium: 135 mmol/L (ref 135–145)

## 2022-06-16 LAB — CBC
HCT: 33.6 % — ABNORMAL LOW (ref 36.0–46.0)
Hemoglobin: 10.4 g/dL — ABNORMAL LOW (ref 12.0–15.0)
MCH: 30.9 pg (ref 26.0–34.0)
MCHC: 31 g/dL (ref 30.0–36.0)
MCV: 99.7 fL (ref 80.0–100.0)
Platelets: 163 10*3/uL (ref 150–400)
RBC: 3.37 MIL/uL — ABNORMAL LOW (ref 3.87–5.11)
RDW: 15.9 % — ABNORMAL HIGH (ref 11.5–15.5)
WBC: 5.7 10*3/uL (ref 4.0–10.5)
nRBC: 0 % (ref 0.0–0.2)

## 2022-06-16 LAB — ECHOCARDIOGRAM COMPLETE
AR max vel: 0.87 cm2
AV Area VTI: 0.8 cm2
AV Area mean vel: 0.71 cm2
AV Mean grad: 28.8 mmHg
AV Peak grad: 44.8 mmHg
Ao pk vel: 3.35 m/s
Area-P 1/2: 3.83 cm2
Height: 66 in
S' Lateral: 2 cm
Single Plane A4C EF: 65.5 %
Weight: 2377.44 oz

## 2022-06-16 LAB — MAGNESIUM: Magnesium: 1.8 mg/dL (ref 1.7–2.4)

## 2022-06-16 LAB — PHOSPHORUS: Phosphorus: 4 mg/dL (ref 2.5–4.6)

## 2022-06-16 MED ORDER — DIPHENHYDRAMINE HCL 50 MG/ML IJ SOLN
INTRAMUSCULAR | Status: AC
  Filled 2022-06-16: qty 1

## 2022-06-16 MED ORDER — ONDANSETRON HCL 4 MG/2ML IJ SOLN
INTRAMUSCULAR | Status: AC
  Filled 2022-06-16: qty 2

## 2022-06-16 MED ORDER — HYDRALAZINE HCL 20 MG/ML IJ SOLN: 10.0000 mg | Freq: Four times a day (QID) | INTRAMUSCULAR | Status: DC | PRN

## 2022-06-16 MED ORDER — HYDRALAZINE HCL 50 MG PO TABS: 50.0000 mg | ORAL_TABLET | Freq: Four times a day (QID) | ORAL | Status: DC | PRN

## 2022-06-16 NOTE — Progress Notes (Signed)
[facility], [name] 06/16/22  Subjective:   LOS: 3  Patient known to our practice from previous admissions.    Patient is seen and evaluated during dialysis   HEMODIALYSIS FLOWSHEET:  Blood Flow Rate (mL/min): 400 mL/min Arterial Pressure (mmHg): -220 mmHg Venous Pressure (mmHg): 300 mmHg TMP (mmHg): -9 mmHg Ultrafiltration Rate (mL/min): 686 mL/min Dialysate Flow Rate (mL/min): 300 ml/min Dialysis Fluid Bolus: Normal Saline Bolus Amount (mL): 300 mL  Eating breakfast during treatment Tolerating treatment well  Objective:  Vital signs in last 24 hours:  Temp:  [98.5 F (36.9 C)-99 F (37.2 C)] 98.6 F (37 C) (02/03 0851) Pulse Rate:  [72-99] 86 (02/03 1000) Resp:  [15-21] 21 (02/03 1000) BP: (97-190)/(61-114) 116/63 (02/03 1000) SpO2:  [98 %-100 %] 98 % (02/03 1000) Weight:  [67.4 kg] 67.4 kg (02/03 0853)  Weight change:  Filed Weights   06/14/22 1035 06/14/22 1511 06/16/22 0853  Weight: 65.6 kg 64.5 kg 67.4 kg    Intake/Output:    Intake/Output Summary (Last 24 hours) at 06/16/2022 1027 Last data filed at 06/16/2022 0500 Gross per 24 hour  Intake 400 ml  Output --  Net 400 ml     Physical Exam: General:  No acute distress, laying in the bed  HEENT  anicteric, moist oral mucous membrane  Pulm/lungs  normal breathing effort, lungs are clear to auscultation  CVS/Heart  regular rhythm, no rub or gallop  Abdomen:   Soft, nontender  Extremities:  No peripheral edema  Neurologic:  Alert, oriented, able to follow commands  Skin:  No acute rashes  Left arm AV fistula.     Basic Metabolic Panel:  Recent Labs  Lab 06/12/22 0733 06/13/22 0425 06/13/22 0904 06/14/22 0524 06/16/22 0321  NA 134* 135  --  135 135  K 3.2* 3.1*  --  4.2 4.6  CL 100 100  --  104 101  CO2 20* 26  --  24 27  GLUCOSE 73 87  --  94 120*  BUN 44* 21  --  31* 41*  CREATININE 6.56* 3.65*  --  4.71* 4.62*  CALCIUM 8.1* 7.7*  --  7.9* 8.0*  MG  --    --  1.9 1.9 1.8  PHOS  --  2.6  --  3.4 4.0      CBC: Recent Labs  Lab 06/12/22 0733 06/14/22 0524 06/16/22 0321  WBC 8.5 6.4 5.7  NEUTROABS 7.1  --   --   HGB 12.3 10.3* 10.4*  HCT 39.9 32.4* 33.6*  MCV 99.8 97.6 99.7  PLT 233 177 163       Lab Results  Component Value Date   HEPBSAG NON REACTIVE 06/12/2022   HEPBSAB NON REACTIVE 11/04/2021   HEPBIGM NON REACTIVE 12/19/2021      Microbiology:  Recent Results (from the past 240 hour(s))  Blood Culture (routine x 2)     Status: None   Collection Time: 06/09/22  8:48 AM   Specimen: BLOOD  Result Value Ref Range Status   Specimen Description BLOOD BLOOD RIGHT HAND  Final   Special Requests   Final    BOTTLES DRAWN AEROBIC AND ANAEROBIC Blood Culture adequate volume   Culture   Final    NO GROWTH 5 DAYS Performed at [facility], [address], [place], [place] [address]    Report Status 06/14/2022 FINAL  Final  Blood Culture (routine x 2)     Status: Abnormal   Collection Time: 06/09/22  10:18 AM   Specimen: BLOOD  Result Value Ref Range Status   Specimen Description   Final    BLOOD RIGHT ARM Performed at [facility], [address] [place], [place] [address]    Special Requests   Final    BOTTLES DRAWN AEROBIC AND ANAEROBIC Blood Culture adequate volume Performed at [facility], [address] [place], [place] [address]    Culture  Setup Time   Final    GRAM POSITIVE COCCI IN BOTH AEROBIC AND ANAEROBIC BOTTLES CRITICAL RESULT CALLED TO, READ BACK BY AND VERIFIED WITH: Clent Ridges RN ED @ [phone] 06/10/22 LFD    Culture (A)  Final    STAPHYLOCOCCUS HOMINIS STAPHYLOCOCCUS EPIDERMIDIS THE SIGNIFICANCE OF ISOLATING THIS ORGANISM FROM A SINGLE SET OF BLOOD CULTURES WHEN MULTIPLE SETS ARE DRAWN IS UNCERTAIN. PLEASE NOTIFY THE MICROBIOLOGY DEPARTMENT WITHIN ONE WEEK IF SPECIATION AND SENSITIVITIES ARE REQUIRED. Performed at [facility], [place] [address] [place], [place]  [address]    Report Status 06/12/2022 FINAL  Final  Blood Culture ID Panel (Reflexed)     Status: Abnormal   Collection Time: 06/09/22 10:18 AM  Result Value Ref Range Status   Enterococcus faecalis NOT DETECTED NOT DETECTED Final   Enterococcus Faecium NOT DETECTED NOT DETECTED Final   Listeria monocytogenes NOT DETECTED NOT DETECTED Final   Staphylococcus species DETECTED (A) NOT DETECTED Final    Comment: CRITICAL RESULT CALLED TO, READ BACK BY AND VERIFIED WITH: Clent Ridges RN ED @ [phone] 06/10/22 LFD    Staphylococcus aureus (BCID) NOT DETECTED NOT DETECTED Final   Staphylococcus epidermidis DETECTED (A) NOT DETECTED Final    Comment: Methicillin (oxacillin) resistant coagulase negative staphylococcus. Possible blood culture contaminant (unless isolated from more than one blood culture draw or clinical case suggests pathogenicity). No antibiotic treatment is indicated for blood  culture contaminants. CRITICAL RESULT CALLED TO, READ BACK BY AND VERIFIED WITH: Clent Ridges RN ED @ [phone] 06/10/22 LFD    Staphylococcus lugdunensis NOT DETECTED NOT DETECTED Final   Streptococcus species NOT DETECTED NOT DETECTED Final   Streptococcus agalactiae NOT DETECTED NOT DETECTED Final   Streptococcus pneumoniae NOT DETECTED NOT DETECTED Final   Streptococcus pyogenes NOT DETECTED NOT DETECTED Final   A.calcoaceticus-baumannii NOT DETECTED NOT DETECTED Final   Bacteroides fragilis NOT DETECTED NOT DETECTED Final   Enterobacterales NOT DETECTED NOT DETECTED Final   Enterobacter cloacae complex NOT DETECTED NOT DETECTED Final   Escherichia coli NOT DETECTED NOT DETECTED Final   Klebsiella aerogenes NOT DETECTED NOT DETECTED Final   Klebsiella oxytoca NOT DETECTED NOT DETECTED Final   Klebsiella pneumoniae NOT DETECTED NOT DETECTED Final   Proteus species NOT DETECTED NOT DETECTED Final   Salmonella species NOT DETECTED NOT DETECTED Final   Serratia marcescens NOT DETECTED NOT DETECTED Final   Haemophilus  influenzae NOT DETECTED NOT DETECTED Final   Neisseria meningitidis NOT DETECTED NOT DETECTED Final   Pseudomonas aeruginosa NOT DETECTED NOT DETECTED Final   Stenotrophomonas maltophilia NOT DETECTED NOT DETECTED Final   Candida albicans NOT DETECTED NOT DETECTED Final   Candida auris NOT DETECTED NOT DETECTED Final   Candida glabrata NOT DETECTED NOT DETECTED Final   Candida krusei NOT DETECTED NOT DETECTED Final   Candida parapsilosis NOT DETECTED NOT DETECTED Final   Candida tropicalis NOT DETECTED NOT DETECTED Final   Cryptococcus neoformans/gattii NOT DETECTED NOT DETECTED Final   Methicillin resistance mecA/C DETECTED (A) NOT DETECTED Final    Comment: CRITICAL RESULT CALLED  TO, READ BACK BY AND VERIFIED WITH: Clent Ridges RN ED @ [phone] 06/10/22 LFD Performed at [facility], [address] [place], [place] [address]   Blood culture (routine x 2)     Status: None (Preliminary result)   Collection Time: 06/12/22  7:30 AM   Specimen: BLOOD RIGHT ARM  Result Value Ref Range Status   Specimen Description BLOOD RIGHT ARM  Final   Special Requests   Final    BOTTLES DRAWN AEROBIC AND ANAEROBIC Blood Culture adequate volume   Culture   Final    NO GROWTH 4 DAYS Performed at [facility], [address] [place], [place] [address]    Report Status PENDING  Incomplete  Resp panel by RT-PCR (RSV, Flu A&B, Covid) Anterior Nasal Swab     Status: None   Collection Time: 06/12/22  7:32 AM   Specimen: Anterior Nasal Swab  Result Value Ref Range Status   SARS Coronavirus 2 by RT PCR NEGATIVE NEGATIVE Final    Comment: (NOTE) SARS-CoV-2 target nucleic acids are NOT DETECTED.  The SARS-CoV-2 RNA is generally detectable in upper respiratory specimens during the acute phase of infection. The lowest concentration of SARS-CoV-2 viral copies this assay can detect is 138 copies/mL. A negative result does not preclude SARS-Cov-2 infection and should not be used as the sole basis  for treatment or other patient management decisions. A negative result may occur with  improper specimen collection/handling, submission of specimen other than nasopharyngeal swab, presence of viral mutation(s) within the areas targeted by this assay, and inadequate number of viral copies(<138 copies/mL). A negative result must be combined with clinical observations, patient history, and epidemiological information. The expected result is Negative.  Fact Sheet for Patients:  EntrepreneurPulse.com.au  Fact Sheet for Healthcare Providers:  IncredibleEmployment.be  This test is no t yet approved or cleared by the [place] FDA and  has been authorized for detection and/or diagnosis of SARS-CoV-2 by FDA under an Emergency Use Authorization (EUA). This EUA will remain  in effect (meaning this test can be used) for the duration of the COVID-19 declaration under Section 564(b)(1) of the Act, 21 U.S.C.section 360bbb-3(b)(1), unless the authorization is terminated  or revoked sooner.       Influenza A by PCR NEGATIVE NEGATIVE Final   Influenza B by PCR NEGATIVE NEGATIVE Final    Comment: (NOTE) The Xpert Xpress SARS-CoV-2/FLU/RSV plus assay is intended as an aid in the diagnosis of influenza from Nasopharyngeal swab specimens and should not be used as a sole basis for treatment. Nasal washings and aspirates are unacceptable for Xpert Xpress SARS-CoV-2/FLU/RSV testing.  Fact Sheet for Patients: EntrepreneurPulse.com.au  Fact Sheet for Healthcare Providers: IncredibleEmployment.be  This test is not yet approved or cleared by the [place] FDA and has been authorized for detection and/or diagnosis of SARS-CoV-2 by FDA under an Emergency Use Authorization (EUA). This EUA will remain in effect (meaning this test can be used) for the duration of the COVID-19 declaration under Section 564(b)(1) of the Act, 21  U.S.C. section 360bbb-3(b)(1), unless the authorization is terminated or revoked.     Resp Syncytial Virus by PCR NEGATIVE NEGATIVE Final    Comment: (NOTE) Fact Sheet for Patients: EntrepreneurPulse.com.au  Fact Sheet for Healthcare Providers: IncredibleEmployment.be  This test is not yet approved or cleared by the [place] FDA and has been authorized for detection and/or diagnosis of SARS-CoV-2 by FDA under an Emergency Use Authorization (EUA). This EUA will remain in effect (meaning  this test can be used) for the duration of the COVID-19 declaration under Section 564(b)(1) of the Act, 21 U.S.C. section 360bbb-3(b)(1), unless the authorization is terminated or revoked.  Performed at [facility], [address] [place], [place] [address]   Blood culture (routine x 2)     Status: None (Preliminary result)   Collection Time: 06/12/22  9:59 PM   Specimen: BLOOD RIGHT HAND  Result Value Ref Range Status   Specimen Description BLOOD RIGHT HAND  Final   Special Requests   Final    BOTTLES DRAWN AEROBIC AND ANAEROBIC Blood Culture adequate volume   Culture   Final    NO GROWTH 4 DAYS Performed at [facility], [address] [place], [place] [address]    Report Status PENDING  Incomplete  Gastrointestinal Panel by PCR , Stool     Status: Abnormal   Collection Time: 06/14/22  2:07 AM   Specimen: STOOL  Result Value Ref Range Status   Campylobacter species NOT DETECTED NOT DETECTED Final   Plesimonas shigelloides NOT DETECTED NOT DETECTED Final   Salmonella species NOT DETECTED NOT DETECTED Final   Yersinia enterocolitica NOT DETECTED NOT DETECTED Final   Vibrio species NOT DETECTED NOT DETECTED Final   Vibrio cholerae NOT DETECTED NOT DETECTED Final   Enteroaggregative E coli (EAEC) NOT DETECTED NOT DETECTED Final   Enteropathogenic E coli (EPEC) NOT DETECTED NOT DETECTED Final   Enterotoxigenic E coli (ETEC) NOT  DETECTED NOT DETECTED Final   Shiga like toxin producing E coli (STEC) NOT DETECTED NOT DETECTED Final   Shigella/Enteroinvasive E coli (EIEC) NOT DETECTED NOT DETECTED Final   Cryptosporidium NOT DETECTED NOT DETECTED Final   Cyclospora cayetanensis NOT DETECTED NOT DETECTED Final   Entamoeba histolytica NOT DETECTED NOT DETECTED Final   Giardia lamblia NOT DETECTED NOT DETECTED Final   Adenovirus F40/41 NOT DETECTED NOT DETECTED Final   Astrovirus NOT DETECTED NOT DETECTED Final   Norovirus GI/GII NOT DETECTED NOT DETECTED Final   Rotavirus A NOT DETECTED NOT DETECTED Final   Sapovirus (I, II, IV, and V) DETECTED (A) NOT DETECTED Final    Comment: Performed at [facility], [address] [place], [place] [address]  C Difficile Quick Screen w PCR reflex     Status: None   Collection Time: 06/14/22  2:07 AM   Specimen: STOOL  Result Value Ref Range Status   C Diff antigen NEGATIVE NEGATIVE Final   C Diff toxin NEGATIVE NEGATIVE Final   C Diff interpretation No C. difficile detected.  Final    Comment: Performed at [facility], [address] [place], [place] [address]    Coagulation Studies: No results for input(s): "LABPROT", "INR" in the last 72 hours.  Urinalysis: No results for input(s): "COLORURINE", "LABSPEC", "PHURINE", "GLUCOSEU", "HGBUR", "BILIRUBINUR", "KETONESUR", "PROTEINUR", "UROBILINOGEN", "NITRITE", "LEUKOCYTESUR" in the last 72 hours.  Invalid input(s): "APPERANCEUR"    Imaging: No results found.   Medications:      Chlorhexidine Gluconate Cloth  6 each Topical Q0600   diphenhydrAMINE       diphenhydrAMINE  25 mg Intravenous Q T,Th,Sa-HD   divalproex  125 mg Oral QHS   heparin  5,000 Units Subcutaneous Q8H   midodrine  10 mg Oral Q T,Th,Sa-HD   ondansetron       oxyCODONE  5 mg Oral Once   sertraline  25 mg Oral Daily   traZODone  50 mg Oral QHS   acetaminophen **OR** acetaminophen, albuterol, diphenhydrAMINE,  ondansetron,  ondansetron **OR** ondansetron (ZOFRAN) IV, oxyCODONE  Assessment/ Plan:  67 y.o. female with medical problems of end-stage renal disease, HD, diabetes, hypertension, history of left arm cellulitis in August 2023 was admitted on 06/12/2022 for  Principal Problem:   Orthostatic hypotension Active Problems:   Hypertension   Type 2 diabetes mellitus (HCC)   ESRD (end stage renal disease) on dialysis (HCC)   Vomiting and diarrhea  Orthostatic hypotension [I95.1] ESRD (end stage renal disease) ([place]) [N18.6] Pre-syncope [R55]  #. ESRD with hypotension [facility] dialysis catheter/TTS/left forearm AV graft Receiving dialysis today, UF 1.5L as tolerated. Next treatment scheduled for Tuesday.   #. Anemia of CKD  Lab Results  Component Value Date   HGB 10.4 (L) 06/16/2022   Hgb stable  #. Secondary hyperparathyroidism of renal origin N 25.81      Component Value Date/Time   PTH 70 (H) 05/12/2020 0946   Lab Results  Component Value Date   PHOS 4.0 06/16/2022   Bone minerals within acceptable range. Will continue to monitor.    #.  Orthostatic hypotension. Antihypertensives placed on hold.  Scheduled midodrine held. Will continue Midodrine with dialysis.   LOS: [place] 2/3/202410:27 [place], [place]

## 2022-06-16 NOTE — Progress Notes (Signed)
PT did 3.5 hrs of HD, tolerated well with no signs of distress  UF = 1500  ml    06/16/22 1250  Vitals  Temp 98.3 F (36.8 C)  Temp Source Oral  BP 124/75  MAP (mmHg) 90  BP Location Right Arm  BP Method Automatic  Patient Position (if appropriate) Lying  Pulse Rate 92  Pulse Rate Source Monitor  ECG Heart Rate 90  Resp (!) 29  Oxygen Therapy  SpO2 98 %  O2 Device Nasal Cannula  O2 Flow Rate (L/min) 2 L/min  Patient Activity (if Appropriate) In bed  Pulse Oximetry Type Continuous  During Treatment Monitoring  Intra-Hemodialysis Comments Tolerated well;Tx completed  Post Treatment  Dialyzer Clearance Lightly streaked  Duration of HD Treatment -hour(s) 3.5 hour(s)  Hemodialysis Intake (mL) 0 mL  Liters Processed 84  Fluid Removed (mL) 1500 mL  Tolerated HD Treatment Yes  Post-Hemodialysis Comments No signs of distress  AVG/AVF Arterial Site Held (minutes) 10 minutes  AVG/AVF Venous Site Held (minutes) 10 minutes  Note  Observations NO issues  Fistula / Graft Left Upper arm Arteriovenous fistula  Placement Date/Time: 03/20/21 1700   Placed prior to admission: Yes  Orientation: Left  Access Location: Upper arm  Access Type: Arteriovenous fistula  Site Condition No complications  Fistula / Graft Assessment Present;Thrill;Bruit  Status Deaccessed  Drainage Description None

## 2022-06-16 NOTE — Progress Notes (Signed)
Physical Therapy Treatment Patient Details Name: Katie [name] MRN: [id number] DOB: 18-Sep-1955 Today's Date: 06/16/2022   History of Present Illness Katie [name] is a 82yoF who comes to [facility] on 1/27 for evaluation of hypotension in the setting of N/V/D x1 week x 1 week, DC from ED back to [facility] same date. Pt returns 1/30 with similar symptoms hypotension/dizziness 30 minutes post HD start, BP 64/37 mmHg. PMH: ESRD on HD TTS, DM, HTN, frequent falls, recurrent syncope, orthostatic hypotension on midodrine 5mg  TID, dog bite cellulitis 2023.    PT Comments    Pt was eating some of her lunch upon arriving. She is well known by author from previous admissions. Pt agrees to session but does endorse some fatigue post HD. Resting BP in supine 150/72. She easily was able to achieve EOB sitting. BP dropped to 107/58 (74). She did not endorse any symptoms of dizziness but does state she felt slightly off. Sat x 2 minute with BP increasing to 110/64 (77) agreeable to attempt standing and gait training. Upon standing BP dropped 89/60 (69) with pt endorsing severe dizziness and lightheaded sensations. Was able to remain standing for two minutes for last BP reading of 86/58. She requested to sit down and was repositioned back to supine. RN in room and aware of concerns. PT has been dealing with hypotension over past two years. She demonstrates good strength and balance but was unsafe to advance away from EOB due to syncopal hx and concerns with hypotension. Acute PT feels pt will be able to return to ALF when medically stable to do so.    Recommendations for follow up therapy are one component of a multi-disciplinary discharge planning process, led by the attending physician.  Recommendations may be updated based on patient status, additional functional criteria and insurance authorization.  Follow Up Recommendations  Home health PT (once able to tolerate ctivity without hypotension concerns)      Assistance Recommended at Discharge PRN  Patient can return home with the following A little help with walking and/or transfers;A little help with bathing/dressing/bathroom;Assist for transportation   Equipment Recommendations  None recommended by PT       Precautions / Restrictions Precautions Precautions: Fall Restrictions Weight Bearing Restrictions: No     Mobility  Bed Mobility Overal bed mobility: Modified Independent Bed Mobility: Supine to Sit, Sit to Supine  Supine to sit: Modified independent (Device/Increase time) Sit to supine: Modified independent (Device/Increase time)   General bed mobility comments: no physical assistance required to exit R side of bed. BP in supine 150/72 dropped to 107/58(74) pt was assymptomatic    Transfers Overall transfer level: Modified independent Equipment used: Rolling walker (2 wheels) Transfers: Sit to/from Stand Sit to Stand: Supervision      General transfer comment: pt's BP dropped to 89/60 (69) c/o dizziness but able to remain standing however BP continued to drop and pt request to sit back down. RN enter room. BP dropped to 86/58 after 2 minutes standing    Ambulation/Gait  General Gait Details: unable due to hyptoension response to change of positions     Balance Overall balance assessment: Needs assistance Sitting-balance support: Feet supported Sitting balance-Leahy Scale: Good     Standing balance support: Bilateral upper extremity supported, During functional activity, Reliant on assistive device for balance Standing balance-Leahy Scale: Good Standing balance comment: pt still at high fall risk due to BP concerns not due to balance deficits         Cognition Arousal/Alertness: Awake/alert  Behavior During Therapy: WFL for tasks assessed/performed Overall Cognitive Status: Within Functional Limits for tasks assessed      General Comments: Pt is A and O x 4. well known by author from previous admissions.  seems to be at baseline cognitively           General Comments General comments (skin integrity, edema, etc.): Pt has had orthostatic BP concerns over past 2 years      Pertinent Vitals/Pain Pain Assessment Pain Assessment: 0-10 Pain Score: 2  Pain Location: R arm from insertion sites/ lab draws/etc Pain Descriptors / Indicators: Aching, Sore Pain Intervention(s): Limited activity within patient's tolerance, Monitored during session, Premedicated before session, Repositioned     PT Goals (current goals can now be found in the care plan section) Acute Rehab PT Goals Patient Stated Goal: get better so I can return to [facility] Progress towards PT goals: Not progressing toward goals - comment (Orthostatic hypotensive)    Frequency    Min 2X/week      PT Plan Current plan remains appropriate       AM-PAC PT "6 Clicks" Mobility   Outcome Measure  Help needed turning from your back to your side while in a flat bed without using bedrails?: None Help needed moving from lying on your back to sitting on the side of a flat bed without using bedrails?: None Help needed moving to and from a bed to a chair (including a wheelchair)?: None Help needed standing up from a chair using your arms (e.g., wheelchair or bedside chair)?: A Little Help needed to walk in hospital room?: A Little Help needed climbing 3-5 steps with a railing? : A Little 6 Click Score: 21    End of Session Equipment Utilized During Treatment: Gait belt Activity Tolerance: Treatment limited secondary to medical complications (Comment) (limited by severe drop in BP with positional changes) Patient left: in bed;with call bell/phone within reach;with nursing/sitter in room Nurse Communication: Mobility status PT Visit Diagnosis: Unsteadiness on feet (R26.81);Difficulty in walking, not elsewhere classified (R26.2);Other abnormalities of gait and mobility (R26.89)     Time: 6073-7106 PT Time Calculation  (min) (ACUTE ONLY): 12 min  Charges:  $Therapeutic Activity: 8-22 mins                     Julaine Fusi PTA 06/16/22, 3:23 PM

## 2022-06-16 NOTE — Progress Notes (Signed)
[facility] Progress Note  Patient: Katie [name]    DDU:[id number]  DOA: 06/12/2022     Date of Service: the patient was seen and examined on 06/16/2022  Chief Complaint  Patient presents with   Dizziness   Hypotension   Brief hospital course: Katie [name] is a 67 y.o. female with medical history significant of ESRD on HD (T, Th, Sa), hypertension, who presents to the ED due to dizziness for 1 wk, associated with chills, abdominal cramps, nausea and vomiting, and diarrhea.  She states she has numerous diarrheal episodes per day but it has been nonbloody and nonmelanotic.  She states she has vomited daily but her last episode was yesterday.  Due to the symptoms, she endorses poor p.o. intake.  In addition, she has missed dialysis for the last 3 sessions due to difficulty with transportation and her current symptoms.  Today, when she went for dialysis, she noticed rapid onset dizziness and near syncope.  She denies loss of consciousness.  Due to this, EMS was called.  She denies any chest pain, shortness of breath, cough, lower extremity edema.   ED course: On arrival to the ED, patient was initially hypotensive at 99/57 with heart rate of 63.  She was saturating at 100% on room air.  Orthostatic vital signs were obtained that demonstrated a marked 44 drop in systolic blood pressure with BP of 62/39 when standing.  Patient received 1 L of normal saline.  Despite this, patient continued to be symptomatic.  [facility] contacted for admission.  Nephrology consulted.   Assessment and Plan: # Orthostatic hypotension Significantly symptomatic orthostatic hypotension in the setting of a diarrheal illness and ongoing antihypertensive use.  She appears hypovolemic on examination.   S/p 1 L of NS in the ED and  1 L during dialysis on 1/30  S/p Midodrine 5 mg once prior to dialysis, s/p midodrine 5 mg p.o. TID  Continue to hold antihypertensive medications Monitor orthostatic vital signs daily. 2/2 resting  blood pressure noticed above 150 so discontinued midodrine.  Patient is still orthostatic hypotension, SBP dropped from 160-110 and patient was feeling lightheaded.    Acute gastroenteritis secondary to Sapovirus infection  Patient presented with 1 week of abdominal cramps, vomiting and diarrhea.  CT of the abdomen with no evidence of complications.  GI pathogen positive for Sabo virus, C. difficile negative - Zofran as needed S/p IV fluids as noted above 2/1 patient had 1 episode of diarrhea, improving. 2/3 no diarrhea yesterday and today   ESRD (end stage renal disease) on dialysis  - Nephrology consulted; continue HD TTS schedule  2/2 started midodrine 10 mg p.o. every TTS hemodialysis days  Type 2 diabetes mellitus  Patient is not currently on any antiglycemic agents at home. -No indication for SSI at this time   Hypertension Patient is on amlodipine and carvedilol at home.   Given that she chronically becomes dizzy during dialysis with an acute exacerbation at this time, I suspect that she may require a higher blood pressure to allow for hemodynamic changes that will occur during dialysis - Hold home Coreg and amlodipine S/p midodrine 5 mg p.o. 3 times daily, DC'd on 2/2 due to higher resting blood pressure Patient still has orthostatic hypotension so we will continue to monitor. Monitor BP and use hydralazine p.o. or IV as per parameters   Audible cardiac murmur Prior echo showed mild aortic stenosis Follow repeat 2D echocardiogram    Body mass index is 23.48 kg/m.  Interventions:  Pressure Injury Buttocks (Active)     Location: Buttocks  Location Orientation:   Staging:   Wound Description (Comments):   Present on Admission:      Diet: Regular diet DVT Prophylaxis: Subcutaneous Heparin    Advance goals of care discussion: DNR  Family Communication: family was not present at bedside, at the time of interview.  The pt provided permission to discuss medical  plan with the family. Opportunity was given to ask question and all questions were answered satisfactorily.   Disposition:  Pt is from ALF, admitted with dizziness, still has orthostatic hypotension, which precludes a safe discharge. Discharge to ALF, when clinically stable, may need 1-2 more days to improve.  Subjective: No significant events overnight, patient was seen during hemodialysis, denies any diarrhea today.  Patient still feels little bit dizzy, we will continue to check orthostatic vital signs and she cannot go back to assisted living facility over the weekend, we will keep her till Monday.   Physical Exam: General: NAD, lying comfortably, seen during HD Appear in no distress, affect appropriate Eyes: PERRLA ENT: Oral Mucosa Clear, moist  Neck: no JVD,  Cardiovascular: S1 and S2 Present,  Murmur audible,  Respiratory: good respiratory effort, Bilateral Air entry equal and Decreased, no Crackles, no wheezes Abdomen: Bowel Sound present, Soft and no tenderness,  Skin: no rashes Extremities: no Pedal edema, no calf tenderness Neurologic: without any new focal findings Gait not checked due to patient safety concerns  Vitals:   06/16/22 1130 06/16/22 1200 06/16/22 1230 06/16/22 1250  BP: 127/73 (!) 158/82 120/78 124/75  Pulse: 73 71 86 92  Resp: 14 15 20  (!) 29  Temp:    98.3 F (36.8 C)  TempSrc:    Oral  SpO2: 100% 100% 100% 98%  Weight:      Height:        Intake/Output Summary (Last 24 hours) at 06/16/2022 1401 Last data filed at 06/16/2022 1250 Gross per 24 hour  Intake 400 ml  Output 1500 ml  Net -1100 ml   Filed Weights   06/14/22 1035 06/14/22 1511 06/16/22 0853  Weight: 65.6 kg 64.5 kg 67.4 kg    Data Reviewed: I have personally reviewed and interpreted daily labs, tele strips, imagings as discussed above. I reviewed all nursing notes, pharmacy notes, vitals, pertinent old records I have discussed plan of care as described above with RN and  patient/family.  CBC: Recent Labs  Lab 06/12/22 0733 06/14/22 0524 06/16/22 0321  WBC 8.5 6.4 5.7  NEUTROABS 7.1  --   --   HGB 12.3 10.3* 10.4*  HCT 39.9 32.4* 33.6*  MCV 99.8 97.6 99.7  PLT 233 177 532   Basic Metabolic Panel: Recent Labs  Lab 06/12/22 0733 06/13/22 0425 06/13/22 0904 06/14/22 0524 06/16/22 0321  NA 134* 135  --  135 135  K 3.2* 3.1*  --  4.2 4.6  CL 100 100  --  104 101  CO2 20* 26  --  24 27  GLUCOSE 73 87  --  94 120*  BUN 44* 21  --  31* 41*  CREATININE 6.56* 3.65*  --  4.71* 4.62*  CALCIUM 8.1* 7.7*  --  7.9* 8.0*  MG  --   --  1.9 1.9 1.8  PHOS  --  2.6  --  3.4 4.0    Studies: No results found.  Scheduled Meds:  Chlorhexidine Gluconate Cloth  6 each Topical Q0600   diphenhydrAMINE  diphenhydrAMINE  25 mg Intravenous Q T,Th,Sa-HD   divalproex  125 mg Oral QHS   heparin  5,000 Units Subcutaneous Q8H   midodrine  10 mg Oral Q T,Th,Sa-HD   ondansetron       oxyCODONE  5 mg Oral Once   sertraline  25 mg Oral Daily   traZODone  50 mg Oral QHS   Continuous Infusions: PRN Meds: acetaminophen **OR** acetaminophen, albuterol, diphenhydrAMINE, ondansetron, ondansetron **OR** ondansetron (ZOFRAN) IV, oxyCODONE  Time spent: 35 minutes  Author: Val Riles. MD [facility] 06/16/2022 2:01 PM  To reach On-call, see care teams to locate the attending and reach out to them via [email or web address]. If 7PM-7AM, please contact night-coverage If you still have difficulty reaching the attending provider, please page the [facility] (Director on Call) for [facility] on amion for assistance.

## 2022-06-16 NOTE — Progress Notes (Signed)
  Echocardiogram 2D Echocardiogram has been performed.  Claretta Fraise 06/16/2022, 4:05 PM

## 2022-06-16 NOTE — Progress Notes (Signed)
PT Cancellation Note  Patient Details Name: Katie [name] MRN: [id number] DOB: [birth year]   Cancelled Treatment:     PT is off floor at HD. Author will return later this date and continue to follow per current POC   Willette Pa 06/16/2022, 9:17 AM

## 2022-06-16 NOTE — Plan of Care (Signed)

## 2022-06-17 DIAGNOSIS — I350 Nonrheumatic aortic (valve) stenosis: Secondary | ICD-10-CM

## 2022-06-17 DIAGNOSIS — I951 Orthostatic hypotension: Secondary | ICD-10-CM | POA: Diagnosis not present

## 2022-06-17 LAB — BASIC METABOLIC PANEL
Anion gap: 9 (ref 5–15)
BUN: 33 mg/dL — ABNORMAL HIGH (ref 8–23)
CO2: 29 mmol/L (ref 22–32)
Calcium: 8.6 mg/dL — ABNORMAL LOW (ref 8.9–10.3)
Chloride: 99 mmol/L (ref 98–111)
Creatinine, Ser: 3.6 mg/dL — ABNORMAL HIGH (ref 0.44–1.00)
GFR, Estimated: 14 mL/min — ABNORMAL LOW (ref >60)
Glucose, Bld: 92 mg/dL (ref 70–99)
Potassium: 4.5 mmol/L (ref 3.5–5.1)
Sodium: 137 mmol/L (ref 135–145)

## 2022-06-17 LAB — CBC
HCT: 36.2 % (ref 36.0–46.0)
Hemoglobin: 11.3 g/dL — ABNORMAL LOW (ref 12.0–15.0)
MCH: 31.1 pg (ref 26.0–34.0)
MCHC: 31.2 g/dL (ref 30.0–36.0)
MCV: 99.7 fL (ref 80.0–100.0)
Platelets: 183 10*3/uL (ref 150–400)
RBC: 3.63 MIL/uL — ABNORMAL LOW (ref 3.87–5.11)
RDW: 15.7 % — ABNORMAL HIGH (ref 11.5–15.5)
WBC: 6 10*3/uL (ref 4.0–10.5)
nRBC: 0 % (ref 0.0–0.2)

## 2022-06-17 LAB — CULTURE, BLOOD (ROUTINE X 2)
Culture: NO GROWTH
Culture: NO GROWTH
Special Requests: ADEQUATE
Special Requests: ADEQUATE

## 2022-06-17 NOTE — Consult Note (Signed)
Cardiology Consultation   Patient ID: Katie [name] MRN: [id number]; DOB: [birth year]  Admit date: 06/12/2022 Date of Consult: 06/17/2022  PCP:  Orvis Brill, [place] [facility] Cardiologist:  Dr. Clyde Lundborg here to update MD or APP on Care Team, Refresh:1}     Patient Profile:   Katie [name] is a 67 y.o. female with a hx of ESRD on HD, hypertension, prior CVA who is being seen 06/17/2022 for the evaluation of aortic stenosis at the request of Dr. Dwyane Dee.  History of Present Illness:   Katie [name] presented with dizziness, nausea, vomiting, diarrhea for about a week, having missed 3 HD sessions due to her illness. When she attempted to get dialysis, she was near syncopal and EMS brought her to [facility] for further evaluation.  On initial presentation, she was hypotensive to 99/57 and severely orthostatic, with a blood pressure of 62/39 with standing. She was admitted for further management. She has been treated with IV fluids, was started on midodrine initially but this was stopped due to resting hypertension. As part of her workup, she had a repeat echocardiogram which shows moderate to severe aortic stenosis. Cardiology consulted for further evaluation.  She tells me that she has many things going on, and it is difficult for her to determine what is causing her to feel bad. We discussed aortic stenosis and its symptoms. She notes that she is always lightheaded with standing and has times when this progresses to syncope. She is not sure how often she completely loses consciousness, but she always has a prodrome of lightheadedness and wakes up instantly on hitting the ground. She also notes chronic shortness of breath. She is on HD but does make some urine. She doesn't notice significant swelling. She sometimes has tightness in her chest when she walks, but this feels more like she can't take a deep breath. Her walking is limiting by balance/gait issues and not by  shortness of breath or chest pain.   Past Medical History:  Diagnosis Date   Hypertension    Renal disorder     Past Surgical History:  Procedure Laterality Date   A/V SHUNT INTERVENTION Left 12/21/2021   Procedure: A/V SHUNT INTERVENTION;  Surgeon: Algernon Huxley, MD;  Location: [facility];  Service: Cardiovascular;  Laterality: Left;   APPENDECTOMY     AV FISTULA PLACEMENT Left 03/16/2021   Procedure: INSERTION OF ARTERIOVENOUS (AV) GORE-TEX GRAFT ARM;  Surgeon: Algernon Huxley, MD;  Location: [facility];  Service: Vascular;  Laterality: Left;   DIALYSIS/PERMA CATHETER INSERTION N/A 05/16/2020   Procedure: DIALYSIS/PERMA CATHETER INSERTION;  Surgeon: Algernon Huxley, MD;  Location: [facility];  Service: Cardiovascular;  Laterality: N/A;   DIALYSIS/PERMA CATHETER REMOVAL N/A 03/26/2022   Procedure: DIALYSIS/PERMA CATHETER REMOVAL;  Surgeon: Algernon Huxley, MD;  Location: [place] [facility];  Service: Cardiovascular;  Laterality: N/A;   DIALYSIS/PERMA CATHETER REPAIR N/A 01/22/2022   Procedure: DIALYSIS/PERMA CATHETER INSERT;  Surgeon: Algernon Huxley, MD;  Location: [place] [facility];  Service: Cardiovascular;  Laterality: N/A;   MANDIBLE SURGERY     RENAL BIOPSY Right    RIGHT HEART CATH N/A 05/10/2020   Procedure: RIGHT HEART CATH;  Surgeon: Nelva Bush, MD;  Location: [place] [facility];  Service: Cardiovascular;  Laterality: N/A;   TEMPORARY DIALYSIS CATHETER N/A 05/11/2020   Procedure: TEMPORARY DIALYSIS CATHETER;  Surgeon: Algernon Huxley, MD;  Location: [facility];  Service: Cardiovascular;  Laterality: N/A;     Home Medications:  Prior to Admission medications   Medication Sig Start Date End Date Taking? Authorizing Provider  acetaminophen (TYLENOL) 500 MG tablet Take 500 mg by mouth every 6 (six) hours as needed for moderate pain.   Yes [provider]  albuterol (VENTOLIN HFA) 108 (90 Base) MCG/ACT inhaler Inhale 1 puff into the lungs every 6  (six) hours as needed for wheezing. 01/29/22  Yes [provider]  carvedilol (COREG) 12.5 MG tablet Take 12.5 mg by mouth daily. 04/19/22  Yes [provider]  clonazePAM (KLONOPIN) 0.5 MG disintegrating tablet Take 0.5 mg by mouth daily as needed (agitation/anxiety).   Yes [provider]  divalproex (DEPAKOTE SPRINKLE) 125 MG capsule Take 125 mg by mouth at bedtime.   Yes [provider]  famotidine (PEPCID) 20 MG tablet Take 20 mg by mouth daily as needed for heartburn or indigestion.   Yes [provider]  furosemide (LASIX) 40 MG tablet Take 1 tablet (40 mg total) by mouth daily. 11/23/21 06/12/22 Yes Barb Merino, MD  oxyCODONE (OXY IR/ROXICODONE) 5 MG immediate release tablet Take 5 mg by mouth every 4 (four) hours as needed for severe pain.   Yes [provider]  pantoprazole (PROTONIX) 40 MG tablet Take 1 tablet (40 mg total) by mouth 2 (two) times daily. 11/22/21 06/12/22 Yes Ghimire, Dante Gang, MD  saccharomyces boulardii (FLORASTOR) 250 MG capsule Take 250 mg by mouth 2 (two) times daily.   Yes [provider]  senna-docusate (SENOKOT-S) 8.6-50 MG tablet Take 1 tablet by mouth 2 (two) times daily as needed for mild constipation. 12/25/21  Yes Nicole Kindred A, DO  sertraline (ZOLOFT) 25 MG tablet Take 25 mg by mouth daily.   Yes [provider]  topiramate (TOPAMAX) 50 MG tablet Take 1 tablet (50 mg total) by mouth 2 (two) times daily as needed (headache). 03/21/21  Yes Sreenath, Sudheer B, MD  traZODone (DESYREL) 50 MG tablet Take 1 tablet (50 mg total) by mouth every evening. 07/02/20 06/12/22 Yes Nolberto Hanlon, MD    Inpatient Medications: Scheduled Meds:  Chlorhexidine Gluconate Cloth  6 each Topical Q0600   diphenhydrAMINE  25 mg Intravenous Q T,Th,Sa-HD   divalproex  125 mg Oral QHS   heparin  5,000 Units Subcutaneous Q8H   midodrine  10 mg Oral Q T,Th,Sa-HD   oxyCODONE  5 mg Oral Once   sertraline  25 mg Oral Daily    traZODone  50 mg Oral QHS   Continuous Infusions:  PRN Meds: acetaminophen **OR** acetaminophen, albuterol, hydrALAZINE, ondansetron **OR** ondansetron (ZOFRAN) IV, oxyCODONE  Allergies:    Allergies  Allergen Reactions   Penicillins Nausea And Vomiting    Pt states "only pills" make her vomit Other reaction(s): Nausea/Vomit    Social History:   Social History   Socioeconomic History   Marital status: Single    Spouse name: Not on file   Number of children: Not on file   Years of education: Not on file   Highest education level: Not on file  Occupational History   Not on file  Tobacco Use   Smoking status: Former    Types: Cigarettes   Smokeless tobacco: Never  Vaping Use   Vaping Use: Never used  Substance and Sexual Activity   Alcohol use: Not Currently   Drug use: Never   Sexual activity: Not on file  Other Topics Concern   Not on file  Social History Narrative   Not  on file   Social Determinants of Health   Financial Resource Strain: Not on file  Food Insecurity: No Food Insecurity (06/14/2022)   Hunger Vital Sign    Worried About Running Out of Food in the Last Year: Never true    Ran Out of Food in the Last Year: Never true  Transportation Needs: No Transportation Needs (06/14/2022)   PRAPARE - Hydrologist (Medical): No    Lack of Transportation (Non-Medical): No  Physical Activity: Not on file  Stress: Not on file  Social Connections: Not on file  Intimate Partner Violence: Not At Risk (06/14/2022)   Humiliation, Afraid, Rape, and Kick questionnaire    Fear of Current or Ex-Partner: No    Emotionally Abused: No    Physically Abused: No    Sexually Abused: No    Family History:    Family History  Problem Relation Age of Onset   Heart attack Mother    Asthma Mother    Uterine cancer Mother    Skin cancer Father    Prostate cancer Father    CAD Father    Parkinson's disease Father    Lung cancer Brother       ROS:  Please see the history of present illness.  Constitutional: Negative for chills, fever, night sweats. Has been losing weight. HENT: Negative for ear pain and hearing loss.   Eyes: Negative for loss of vision and eye pain.  Respiratory: Chronic shortness of breath/dry cough Cardiovascular: See HPI. Gastrointestinal: Negative for melena, and hematochezia. Positive for gastroenteritis Genitourinary: Negative for dysuria and hematuria.  Musculoskeletal: Positive for chronic imbalance Skin: Negative for itching and rash.  Neurological: Negative for focal weakness, focal sensory changes Endo/Heme/Allergies: Does not bruise/bleed easily.   All other ROS reviewed and negative.     Physical Exam/Data:   Vitals:   06/16/22 1250 06/16/22 1922 06/17/22 0432 06/17/22 1034  BP: 124/75 (!) 152/73 (!) 148/71 (!) 141/74  Pulse: 92 90 89 82  Resp: (!) 29 18 17 17   Temp: 98.3 F (36.8 C) 98.1 F (36.7 C) 98.1 F (36.7 C) 98.1 F (36.7 C)  TempSrc: Oral  Oral   SpO2: 98% 97% 98% 100%  Weight:      Height:        Intake/Output Summary (Last 24 hours) at 06/17/2022 1220 Last data filed at 06/16/2022 1250 Gross per 24 hour  Intake --  Output 1500 ml  Net -1500 ml      06/16/2022    8:53 AM 06/14/2022    3:11 PM 06/14/2022   10:35 AM  Last 3 Weights  Weight (lbs) 148 lb 9.4 oz 142 lb 3.2 oz 144 lb 10 oz  Weight (kg) 67.4 kg 64.5 kg 65.6 kg     Body mass index is 23.98 kg/m.  General:  Well nourished, well developed, in no acute distress HEENT: normal Neck: no JVD appreciated Vascular: No carotid bruits; Distal pulses 2+ bilaterally Cardiac:  normal S1, S2; RRR; 3/6 harsh systolic murmur, early peaking but quiet S2, radiates to carotids Lungs:  largely clear but diminished at bases, coarse without clear wheezing Abd: soft, nontender, no hepatomegaly  Ext: no edema Musculoskeletal:  No deformities Skin: warm and dry  Neuro:  no focal abnormalities noted Psych:  Normal affect    EKG:  The EKG was personally reviewed and demonstrates:  SR, LVH at 66 bpm Telemetry:  Telemetry was personally reviewed and demonstrates:  patient is not on telemetry  Relevant CV Studies: Echo as below  Laboratory Data:  High Sensitivity Troponin:   Recent Labs  Lab 06/12/22 0733 06/12/22 0950  TROPONINIHS 24* 21*     Chemistry Recent Labs  Lab 06/13/22 0904 06/14/22 0524 06/16/22 0321 06/17/22 0444  NA  --  135 135 137  K  --  4.2 4.6 4.5  CL  --  104 101 99  CO2  --  24 27 29   GLUCOSE  --  94 120* 92  BUN  --  31* 41* 33*  CREATININE  --  4.71* 4.62* 3.60*  CALCIUM  --  7.9* 8.0* 8.6*  MG 1.9 1.9 1.8  --   GFRNONAA  --  10* 10* 14*  ANIONGAP  --  7 7 9     Recent Labs  Lab 06/12/22 0733 06/13/22 0425  PROT 7.0  --   ALBUMIN 3.5 2.8*  AST 11*  --   ALT 9  --   ALKPHOS 104  --   BILITOT 0.9  --    Lipids No results for input(s): "CHOL", "TRIG", "HDL", "LABVLDL", "LDLCALC", "CHOLHDL" in the last 168 hours.  Hematology Recent Labs  Lab 06/14/22 0524 06/16/22 0321 06/17/22 0444  WBC 6.4 5.7 6.0  RBC 3.32* 3.37* 3.63*  HGB 10.3* 10.4* 11.3*  HCT 32.4* 33.6* 36.2  MCV 97.6 99.7 99.7  MCH 31.0 30.9 31.1  MCHC 31.8 31.0 31.2  RDW 15.7* 15.9* 15.7*  PLT 177 163 183   Thyroid No results for input(s): "TSH", "FREET4" in the last 168 hours.  BNPNo results for input(s): "BNP", "PROBNP" in the last 168 hours.  DDimer No results for input(s): "DDIMER" in the last 168 hours.   Radiology/Studies:  ECHOCARDIOGRAM COMPLETE  Result Date: 06/16/2022    ECHOCARDIOGRAM REPORT   Patient Name:   Katie [name] Date of Exam: 06/16/2022 Medical Rec #:  [id number]    Height:       66.0 in Accession #:    [id number]   Weight:       148.6 lb Date of Birth:  [birth year]   BSA:          1.763 m Patient Age:    41 years     BP:           132/61 mmHg Patient Gender: F            HR:           87 bpm. Exam Location:  [facility] Procedure: 2D Echo Indications:     Murmur R01.1  History:          Patient has prior history of Echocardiogram examinations, most                  recent 03/08/2021.  Sonographer:     Kathlen Brunswick RDCS Referring Phys:  [place] Diagnosing Phys: [place]  1. Left ventricular ejection fraction, by estimation, is 60 to 65%. The left ventricle has normal function. The left ventricle has no regional wall motion abnormalities. There is severe concentric left ventricular hypertrophy. Left ventricular diastolic  parameters are consistent with Grade III diastolic dysfunction (restrictive).  2. Right ventricular systolic function is normal. The right ventricular size is normal.  3. Left atrial size was mildly dilated.  4. Right atrial size was mildly dilated.  5. The mitral valve is degenerative. Mild mitral valve regurgitation. No evidence of mitral stenosis. Moderate to severe mitral annular calcification.  6. The aortic valve is calcified. Aortic  valve regurgitation is mild. Moderate to severe aortic valve stenosis.  7. The inferior vena cava is normal in size with greater than 50% respiratory variability, suggesting right atrial pressure of 3 mmHg. Conclusion(s)/Recommendation(s): Valvular findings as outlined below. FINDINGS  Left Ventricle: Left ventricular ejection fraction, by estimation, is 60 to 65%. The left ventricle has normal function. The left ventricle has no regional wall motion abnormalities. The left ventricular internal cavity size was normal in size. There is  severe concentric left ventricular hypertrophy. Left ventricular diastolic parameters are consistent with Grade III diastolic dysfunction (restrictive). Right Ventricle: The right ventricular size is normal. No increase in right ventricular wall thickness. Right ventricular systolic function is normal. Left Atrium: Left atrial size was mildly dilated. Right Atrium: Right atrial size was mildly dilated. Pericardium: There is no evidence of pericardial effusion. Mitral Valve: The  mitral valve is degenerative in appearance. Moderate to severe mitral annular calcification. Mild mitral valve regurgitation. No evidence of mitral valve stenosis. MV peak gradient, 11.3 mmHg. The mean mitral valve gradient is 5.0 mmHg. Tricuspid Valve: The tricuspid valve is normal in structure. Tricuspid valve regurgitation is not demonstrated. No evidence of tricuspid stenosis. Aortic Valve: The aortic valve is calcified. Aortic valve regurgitation is mild. Moderate to severe aortic stenosis is present. Aortic valve mean gradient measures 28.8 mmHg. Aortic valve peak gradient measures 44.8 mmHg. Aortic valve area, by VTI measures 0.80 cm. Pulmonic Valve: The pulmonic valve was normal in structure. Pulmonic valve regurgitation is not visualized. No evidence of pulmonic stenosis. Aorta: The aortic root is normal in size and structure. Venous: The inferior vena cava is normal in size with greater than 50% respiratory variability, suggesting right atrial pressure of 3 mmHg. IAS/Shunts: No atrial level shunt detected by color flow Doppler.  LEFT VENTRICLE PLAX 2D LVIDd:         2.90 cm     Diastology LVIDs:         2.00 cm     LV e' medial:    4.57 cm/s LV PW:         1.80 cm     LV E/e' medial:  29.1 LV IVS:        1.65 cm     LV e' lateral:   5.77 cm/s LVOT diam:     1.80 cm     LV E/e' lateral: 23.1 LV SV:         60 LV SV Index:   34 LVOT Area:     2.54 cm  LV Volumes (MOD) LV vol d, MOD A4C: 85.6 ml LV vol s, MOD A4C: 29.5 ml LV SV MOD A4C:     85.6 ml RIGHT VENTRICLE RV Basal diam:  2.80 cm RV S prime:     13.30 cm/s TAPSE (M-mode): 2.4 cm LEFT ATRIUM           Index        RIGHT ATRIUM           Index LA diam:      2.50 cm 1.42 cm/m   RA Area:     14.50 cm LA Vol (A2C): 24.6 ml 13.96 ml/m  RA Volume:   37.20 ml  21.11 ml/m LA Vol (A4C): 67.2 ml 38.13 ml/m  AORTIC VALVE                     PULMONIC VALVE AV Area (Vmax):    0.87 cm      PV Vmax:  1.86 m/s AV Area (Vmean):   0.71 cm      PV Peak  grad:  13.8 mmHg AV Area (VTI):     0.80 cm AV Vmax:           334.80 cm/s AV Vmean:          250.000 cm/s AV VTI:            0.746 m AV Peak Grad:      44.8 mmHg AV Mean Grad:      28.8 mmHg LVOT Vmax:         114.00 cm/s LVOT Vmean:        69.400 cm/s LVOT VTI:          0.234 m LVOT/AV VTI ratio: 0.31  AORTA Ao Root diam: 3.20 cm Ao Asc diam:  3.60 cm MITRAL VALVE                TRICUSPID VALVE MV Area (PHT): 3.83 cm     TV Peak grad:   36.5 mmHg MV Peak grad:  11.3 mmHg    TV Vmax:        3.02 m/s MV Mean grad:  5.0 mmHg MV Vmax:       1.68 m/s     SHUNTS MV Vmean:      106.0 cm/s   Systemic VTI:  0.23 m MV Decel Time: 198 msec     Systemic Diam: 1.80 cm MV E velocity: 133.00 cm/s MV A velocity: 165.00 cm/s MV E/A ratio:  0.81 Shaukat Khan Electronically signed by Neoma Laming Signature Date/Time: 06/16/2022/5:17:40 PM    Final      Assessment and Plan:   Moderate to severe aortic stenosis -echo from 06/16/22 personally reviewed. Mean gradient 28.8 mmHg, peak velocity 3.35 m/s, DI 0.31, AVA 0.80 -her stroke volume is 60 ml/SVI 34. Cannot exclude low flow low gradient severe AS based on this -very difficult to determine if she is symptomatic. She has orthostasis which sounds to be the trigger for her intermittent syncope. Her volume status is managed by dialysis. She walks only short distances and is limited most by gait instability -overall we discussed that aortic stenosis is an outpatient workup. With her comorbidities and unclear symptoms, it would be most helpful to see what her functional capacity is when she is well to determine how much benefit she might get from a procedure. -I will request outpatient follow up with cardiology to discuss further.  Hypertension at home, now with orthostatic hypotension Diarrheal illness and dehydration on presentation -home amlodipine, carvedilol on hold. Agree with this -started on midodrine, but standing dose was stopped due to resting hypertension. Now  ordered for prior to HD  ESRD on HD -per nephrology  Cardiology will sign off. I will arrange for outpatient follow up with our office.   Risk Assessment/Risk Scores:                For questions or updates, please contact [place] Please consult [email or web address] for contact info under    Signed, Buford Dresser, MD  06/17/2022 12:20 PM

## 2022-06-17 NOTE — Progress Notes (Signed)
[facility], [name] 06/17/22  Subjective:   LOS: 4  Patient known to our practice from previous admissions.    Patient seen resting in bed Denies pain or discomfort Continues to complain of dizziness and experience orthostatic hypotension with activity, per chart review Room air No lower extremity edema Appetite remains appropriate  Objective:  Vital signs in last 24 hours:  Temp:  [98.1 F (36.7 C)-98.3 F (36.8 C)] 98.1 F (36.7 C) (02/04 1034) Pulse Rate:  [82-92] 82 (02/04 1034) Resp:  [17-29] 17 (02/04 1034) BP: (124-152)/(71-75) 141/74 (02/04 1034) SpO2:  [97 %-100 %] 100 % (02/04 1034)  Weight change:  Filed Weights   06/14/22 1035 06/14/22 1511 06/16/22 0853  Weight: 65.6 kg 64.5 kg 67.4 kg    Intake/Output:    Intake/Output Summary (Last 24 hours) at 06/17/2022 1239 Last data filed at 06/16/2022 1250 Gross per 24 hour  Intake --  Output 1500 ml  Net -1500 ml     Physical Exam: General:  No acute distress, laying in the bed  HEENT  anicteric, moist oral mucous membrane  Pulm/lungs  normal breathing effort, lungs are clear to auscultation  CVS/Heart  regular rhythm, no rub or gallop  Abdomen:   Soft, nontender  Extremities:  No peripheral edema  Neurologic:  Alert, oriented, able to follow commands  Skin:  No acute rashes  Left arm AV fistula.     Basic Metabolic Panel:  Recent Labs  Lab 06/12/22 0733 06/13/22 0425 06/13/22 0904 06/14/22 0524 06/16/22 0321 06/17/22 0444  NA 134* 135  --  135 135 137  K 3.2* 3.1*  --  4.2 4.6 4.5  CL 100 100  --  104 101 99  CO2 20* 26  --  24 27 29   GLUCOSE 73 87  --  94 120* 92  BUN 44* 21  --  31* 41* 33*  CREATININE 6.56* 3.65*  --  4.71* 4.62* 3.60*  CALCIUM 8.1* 7.7*  --  7.9* 8.0* 8.6*  MG  --   --  1.9 1.9 1.8  --   PHOS  --  2.6  --  3.4 4.0  --       CBC: Recent Labs  Lab 06/12/22 0733 06/14/22 0524 06/16/22 0321 06/17/22 0444  WBC 8.5 6.4 5.7 6.0   NEUTROABS 7.1  --   --   --   HGB 12.3 10.3* 10.4* 11.3*  HCT 39.9 32.4* 33.6* 36.2  MCV 99.8 97.6 99.7 99.7  PLT 233 177 163 183       Lab Results  Component Value Date   HEPBSAG NON REACTIVE 06/12/2022   HEPBSAB NON REACTIVE 11/04/2021   HEPBIGM NON REACTIVE 12/19/2021      Microbiology:  Recent Results (from the past 240 hour(s))  Blood Culture (routine x 2)     Status: None   Collection Time: 06/09/22  8:48 AM   Specimen: BLOOD  Result Value Ref Range Status   Specimen Description BLOOD BLOOD RIGHT HAND  Final   Special Requests   Final    BOTTLES DRAWN AEROBIC AND ANAEROBIC Blood Culture adequate volume   Culture   Final    NO GROWTH 5 DAYS Performed at [facility], [address] [place], [place] [address]    Report Status 06/14/2022 FINAL  Final  Blood Culture (routine x 2)     Status: Abnormal   Collection Time: 06/09/22 10:18 AM   Specimen: BLOOD  Result Value Ref Range  Status   Specimen Description   Final    BLOOD RIGHT ARM Performed at [facility], [address] [place], [place] [address]    Special Requests   Final    BOTTLES DRAWN AEROBIC AND ANAEROBIC Blood Culture adequate volume Performed at [facility], [address] [place], [place] [address]    Culture  Setup Time   Final    GRAM POSITIVE COCCI IN BOTH AEROBIC AND ANAEROBIC BOTTLES CRITICAL RESULT CALLED TO, READ BACK BY AND VERIFIED WITH: Clent Ridges RN ED @ [phone] 06/10/22 LFD    Culture (A)  Final    STAPHYLOCOCCUS HOMINIS STAPHYLOCOCCUS EPIDERMIDIS THE SIGNIFICANCE OF ISOLATING THIS ORGANISM FROM A SINGLE SET OF BLOOD CULTURES WHEN MULTIPLE SETS ARE DRAWN IS UNCERTAIN. PLEASE NOTIFY THE MICROBIOLOGY DEPARTMENT WITHIN ONE WEEK IF SPECIATION AND SENSITIVITIES ARE REQUIRED. Performed at [facility], [place] [address], [place], [place] [address]    Report Status 06/12/2022 FINAL  Final  Blood Culture ID Panel (Reflexed)     Status: Abnormal    Collection Time: 06/09/22 10:18 AM  Result Value Ref Range Status   Enterococcus faecalis NOT DETECTED NOT DETECTED Final   Enterococcus Faecium NOT DETECTED NOT DETECTED Final   Listeria monocytogenes NOT DETECTED NOT DETECTED Final   Staphylococcus species DETECTED (A) NOT DETECTED Final    Comment: CRITICAL RESULT CALLED TO, READ BACK BY AND VERIFIED WITH: Clent Ridges RN ED @ [phone] 06/10/22 LFD    Staphylococcus aureus (BCID) NOT DETECTED NOT DETECTED Final   Staphylococcus epidermidis DETECTED (A) NOT DETECTED Final    Comment: Methicillin (oxacillin) resistant coagulase negative staphylococcus. Possible blood culture contaminant (unless isolated from more than one blood culture draw or clinical case suggests pathogenicity). No antibiotic treatment is indicated for blood  culture contaminants. CRITICAL RESULT CALLED TO, READ BACK BY AND VERIFIED WITH: Clent Ridges RN ED @ [phone] 06/10/22 LFD    Staphylococcus lugdunensis NOT DETECTED NOT DETECTED Final   Streptococcus species NOT DETECTED NOT DETECTED Final   Streptococcus agalactiae NOT DETECTED NOT DETECTED Final   Streptococcus pneumoniae NOT DETECTED NOT DETECTED Final   Streptococcus pyogenes NOT DETECTED NOT DETECTED Final   A.calcoaceticus-baumannii NOT DETECTED NOT DETECTED Final   Bacteroides fragilis NOT DETECTED NOT DETECTED Final   Enterobacterales NOT DETECTED NOT DETECTED Final   Enterobacter cloacae complex NOT DETECTED NOT DETECTED Final   Escherichia coli NOT DETECTED NOT DETECTED Final   Klebsiella aerogenes NOT DETECTED NOT DETECTED Final   Klebsiella oxytoca NOT DETECTED NOT DETECTED Final   Klebsiella pneumoniae NOT DETECTED NOT DETECTED Final   Proteus species NOT DETECTED NOT DETECTED Final   Salmonella species NOT DETECTED NOT DETECTED Final   Serratia marcescens NOT DETECTED NOT DETECTED Final   Haemophilus influenzae NOT DETECTED NOT DETECTED Final   Neisseria meningitidis NOT DETECTED NOT DETECTED Final    Pseudomonas aeruginosa NOT DETECTED NOT DETECTED Final   Stenotrophomonas maltophilia NOT DETECTED NOT DETECTED Final   Candida albicans NOT DETECTED NOT DETECTED Final   Candida auris NOT DETECTED NOT DETECTED Final   Candida glabrata NOT DETECTED NOT DETECTED Final   Candida krusei NOT DETECTED NOT DETECTED Final   Candida parapsilosis NOT DETECTED NOT DETECTED Final   Candida tropicalis NOT DETECTED NOT DETECTED Final   Cryptococcus neoformans/gattii NOT DETECTED NOT DETECTED Final   Methicillin resistance mecA/C DETECTED (A) NOT DETECTED Final    Comment: CRITICAL RESULT CALLED TO, READ BACK BY AND VERIFIED WITH: Clent Ridges RN ED @  [phone] 06/10/22 LFD Performed at [facility], [address] [place], [place] [address]   Blood culture (routine x 2)     Status: None   Collection Time: 06/12/22  7:30 AM   Specimen: BLOOD RIGHT ARM  Result Value Ref Range Status   Specimen Description BLOOD RIGHT ARM  Final   Special Requests   Final    BOTTLES DRAWN AEROBIC AND ANAEROBIC Blood Culture adequate volume   Culture   Final    NO GROWTH 5 DAYS Performed at [facility], [address] [place], [place] [address]    Report Status 06/17/2022 FINAL  Final  Resp panel by RT-PCR (RSV, Flu A&B, Covid) Anterior Nasal Swab     Status: None   Collection Time: 06/12/22  7:32 AM   Specimen: Anterior Nasal Swab  Result Value Ref Range Status   SARS Coronavirus 2 by RT PCR NEGATIVE NEGATIVE Final    Comment: (NOTE) SARS-CoV-2 target nucleic acids are NOT DETECTED.  The SARS-CoV-2 RNA is generally detectable in upper respiratory specimens during the acute phase of infection. The lowest concentration of SARS-CoV-2 viral copies this assay can detect is 138 copies/mL. A negative result does not preclude SARS-Cov-2 infection and should not be used as the sole basis for treatment or other patient management decisions. A negative result may occur with  improper specimen  collection/handling, submission of specimen other than nasopharyngeal swab, presence of viral mutation(s) within the areas targeted by this assay, and inadequate number of viral copies(<138 copies/mL). A negative result must be combined with clinical observations, patient history, and epidemiological information. The expected result is Negative.  Fact Sheet for Patients:  EntrepreneurPulse.com.au  Fact Sheet for Healthcare Providers:  IncredibleEmployment.be  This test is no t yet approved or cleared by the [place] FDA and  has been authorized for detection and/or diagnosis of SARS-CoV-2 by FDA under an Emergency Use Authorization (EUA). This EUA will remain  in effect (meaning this test can be used) for the duration of the COVID-19 declaration under Section 564(b)(1) of the Act, 21 U.S.C.section 360bbb-3(b)(1), unless the authorization is terminated  or revoked sooner.       Influenza A by PCR NEGATIVE NEGATIVE Final   Influenza B by PCR NEGATIVE NEGATIVE Final    Comment: (NOTE) The Xpert Xpress SARS-CoV-2/FLU/RSV plus assay is intended as an aid in the diagnosis of influenza from Nasopharyngeal swab specimens and should not be used as a sole basis for treatment. Nasal washings and aspirates are unacceptable for Xpert Xpress SARS-CoV-2/FLU/RSV testing.  Fact Sheet for Patients: EntrepreneurPulse.com.au  Fact Sheet for Healthcare Providers: IncredibleEmployment.be  This test is not yet approved or cleared by the [place] FDA and has been authorized for detection and/or diagnosis of SARS-CoV-2 by FDA under an Emergency Use Authorization (EUA). This EUA will remain in effect (meaning this test can be used) for the duration of the COVID-19 declaration under Section 564(b)(1) of the Act, 21 U.S.C. section 360bbb-3(b)(1), unless the authorization is terminated or revoked.     Resp Syncytial  Virus by PCR NEGATIVE NEGATIVE Final    Comment: (NOTE) Fact Sheet for Patients: EntrepreneurPulse.com.au  Fact Sheet for Healthcare Providers: IncredibleEmployment.be  This test is not yet approved or cleared by the [place] FDA and has been authorized for detection and/or diagnosis of SARS-CoV-2 by FDA under an Emergency Use Authorization (EUA). This EUA will remain in effect (meaning this test can be used) for the duration of the COVID-19 declaration under  Section 564(b)(1) of the Act, 21 U.S.C. section 360bbb-3(b)(1), unless the authorization is terminated or revoked.  Performed at [facility], [address] [place], [place] [address]   Blood culture (routine x 2)     Status: None   Collection Time: 06/12/22  9:59 PM   Specimen: BLOOD RIGHT HAND  Result Value Ref Range Status   Specimen Description BLOOD RIGHT HAND  Final   Special Requests   Final    BOTTLES DRAWN AEROBIC AND ANAEROBIC Blood Culture adequate volume   Culture   Final    NO GROWTH 5 DAYS Performed at [facility], [address] [place], [place] [address]    Report Status 06/17/2022 FINAL  Final  Gastrointestinal Panel by PCR , Stool     Status: Abnormal   Collection Time: 06/14/22  2:07 AM   Specimen: STOOL  Result Value Ref Range Status   Campylobacter species NOT DETECTED NOT DETECTED Final   Plesimonas shigelloides NOT DETECTED NOT DETECTED Final   Salmonella species NOT DETECTED NOT DETECTED Final   Yersinia enterocolitica NOT DETECTED NOT DETECTED Final   Vibrio species NOT DETECTED NOT DETECTED Final   Vibrio cholerae NOT DETECTED NOT DETECTED Final   Enteroaggregative E coli (EAEC) NOT DETECTED NOT DETECTED Final   Enteropathogenic E coli (EPEC) NOT DETECTED NOT DETECTED Final   Enterotoxigenic E coli (ETEC) NOT DETECTED NOT DETECTED Final   Shiga like toxin producing E coli (STEC) NOT DETECTED NOT DETECTED Final    Shigella/Enteroinvasive E coli (EIEC) NOT DETECTED NOT DETECTED Final   Cryptosporidium NOT DETECTED NOT DETECTED Final   Cyclospora cayetanensis NOT DETECTED NOT DETECTED Final   Entamoeba histolytica NOT DETECTED NOT DETECTED Final   Giardia lamblia NOT DETECTED NOT DETECTED Final   Adenovirus F40/41 NOT DETECTED NOT DETECTED Final   Astrovirus NOT DETECTED NOT DETECTED Final   Norovirus GI/GII NOT DETECTED NOT DETECTED Final   Rotavirus A NOT DETECTED NOT DETECTED Final   Sapovirus (I, II, IV, and V) DETECTED (A) NOT DETECTED Final    Comment: Performed at [facility], [address] [place], [place] [address]  C Difficile Quick Screen w PCR reflex     Status: None   Collection Time: 06/14/22  2:07 AM   Specimen: STOOL  Result Value Ref Range Status   C Diff antigen NEGATIVE NEGATIVE Final   C Diff toxin NEGATIVE NEGATIVE Final   C Diff interpretation No C. difficile detected.  Final    Comment: Performed at [facility], [address] [place], [place] [address]    Coagulation Studies: No results for input(s): "LABPROT", "INR" in the last 72 hours.  Urinalysis: No results for input(s): "COLORURINE", "LABSPEC", "PHURINE", "GLUCOSEU", "HGBUR", "BILIRUBINUR", "KETONESUR", "PROTEINUR", "UROBILINOGEN", "NITRITE", "LEUKOCYTESUR" in the last 72 hours.  Invalid input(s): "APPERANCEUR"    Imaging: ECHOCARDIOGRAM COMPLETE  Result Date: 06/16/2022    ECHOCARDIOGRAM REPORT   Patient Name:   [facility] Date of Exam: 06/16/2022 Medical Rec #:  [id number]    Height:       66.0 in Accession #:    [id number]   Weight:       148.6 lb Date of Birth:  12-24-1955   BSA:          1.763 m Patient Age:    67 years     BP:           132/61 mmHg Patient Gender: F  HR:           87 bpm. Exam Location:  [facility] Procedure: 2D Echo Indications:     Murmur R01.1  History:         Patient has prior history of Echocardiogram examinations, most                  recent 03/08/2021.   Sonographer:     Kathlen Brunswick RDCS Referring Phys:  [place] Diagnosing Phys: [place]  1. Left ventricular ejection fraction, by estimation, is 60 to 65%. The left ventricle has normal function. The left ventricle has no regional wall motion abnormalities. There is severe concentric left ventricular hypertrophy. Left ventricular diastolic  parameters are consistent with Grade III diastolic dysfunction (restrictive).  2. Right ventricular systolic function is normal. The right ventricular size is normal.  3. Left atrial size was mildly dilated.  4. Right atrial size was mildly dilated.  5. The mitral valve is degenerative. Mild mitral valve regurgitation. No evidence of mitral stenosis. Moderate to severe mitral annular calcification.  6. The aortic valve is calcified. Aortic valve regurgitation is mild. Moderate to severe aortic valve stenosis.  7. The inferior vena cava is normal in size with greater than 50% respiratory variability, suggesting right atrial pressure of 3 mmHg. Conclusion(s)/Recommendation(s): Valvular findings as outlined below. FINDINGS  Left Ventricle: Left ventricular ejection fraction, by estimation, is 60 to 65%. The left ventricle has normal function. The left ventricle has no regional wall motion abnormalities. The left ventricular internal cavity size was normal in size. There is  severe concentric left ventricular hypertrophy. Left ventricular diastolic parameters are consistent with Grade III diastolic dysfunction (restrictive). Right Ventricle: The right ventricular size is normal. No increase in right ventricular wall thickness. Right ventricular systolic function is normal. Left Atrium: Left atrial size was mildly dilated. Right Atrium: Right atrial size was mildly dilated. Pericardium: There is no evidence of pericardial effusion. Mitral Valve: The mitral valve is degenerative in appearance. Moderate to severe mitral annular calcification. Mild mitral  valve regurgitation. No evidence of mitral valve stenosis. MV peak gradient, 11.3 mmHg. The mean mitral valve gradient is 5.0 mmHg. Tricuspid Valve: The tricuspid valve is normal in structure. Tricuspid valve regurgitation is not demonstrated. No evidence of tricuspid stenosis. Aortic Valve: The aortic valve is calcified. Aortic valve regurgitation is mild. Moderate to severe aortic stenosis is present. Aortic valve mean gradient measures 28.8 mmHg. Aortic valve peak gradient measures 44.8 mmHg. Aortic valve area, by VTI measures 0.80 cm. Pulmonic Valve: The pulmonic valve was normal in structure. Pulmonic valve regurgitation is not visualized. No evidence of pulmonic stenosis. Aorta: The aortic root is normal in size and structure. Venous: The inferior vena cava is normal in size with greater than 50% respiratory variability, suggesting right atrial pressure of 3 mmHg. IAS/Shunts: No atrial level shunt detected by color flow Doppler.  LEFT VENTRICLE PLAX 2D LVIDd:         2.90 cm     Diastology LVIDs:         2.00 cm     LV e' medial:    4.57 cm/s LV PW:         1.80 cm     LV E/e' medial:  29.1 LV IVS:        1.65 cm     LV e' lateral:   5.77 cm/s LVOT diam:     1.80 cm     LV E/e' lateral: 23.1  LV SV:         60 LV SV Index:   34 LVOT Area:     2.54 cm  LV Volumes (MOD) LV vol d, MOD A4C: 85.6 ml LV vol s, MOD A4C: 29.5 ml LV SV MOD A4C:     85.6 ml RIGHT VENTRICLE RV Basal diam:  2.80 cm RV S prime:     13.30 cm/s TAPSE (M-mode): 2.4 cm LEFT ATRIUM           Index        RIGHT ATRIUM           Index LA diam:      2.50 cm 1.42 cm/m   RA Area:     14.50 cm LA Vol (A2C): 24.6 ml 13.96 ml/m  RA Volume:   37.20 ml  21.11 ml/m LA Vol (A4C): 67.2 ml 38.13 ml/m  AORTIC VALVE                     PULMONIC VALVE AV Area (Vmax):    0.87 cm      PV Vmax:       1.86 m/s AV Area (Vmean):   0.71 cm      PV Peak grad:  13.8 mmHg AV Area (VTI):     0.80 cm AV Vmax:           334.80 cm/s AV Vmean:          250.000 cm/s  AV VTI:            0.746 m AV Peak Grad:      44.8 mmHg AV Mean Grad:      28.8 mmHg LVOT Vmax:         114.00 cm/s LVOT Vmean:        69.400 cm/s LVOT VTI:          0.234 m LVOT/AV VTI ratio: 0.31  AORTA Ao Root diam: 3.20 cm Ao Asc diam:  3.60 cm MITRAL VALVE                TRICUSPID VALVE MV Area (PHT): 3.83 cm     TV Peak grad:   36.5 mmHg MV Peak grad:  11.3 mmHg    TV Vmax:        3.02 m/s MV Mean grad:  5.0 mmHg MV Vmax:       1.68 m/s     SHUNTS MV Vmean:      106.0 cm/s   Systemic VTI:  0.23 m MV Decel Time: 198 msec     Systemic Diam: 1.80 cm MV E velocity: 133.00 cm/s MV A velocity: 165.00 cm/s MV E/A ratio:  0.81 Shaukat Khan Electronically signed by Neoma Laming Signature Date/Time: 06/16/2022/5:17:40 PM    Final      Medications:      Chlorhexidine Gluconate Cloth  6 each Topical Q0600   diphenhydrAMINE  25 mg Intravenous Q T,Th,Sa-HD   divalproex  125 mg Oral QHS   heparin  5,000 Units Subcutaneous Q8H   midodrine  10 mg Oral Q T,Th,Sa-HD   oxyCODONE  5 mg Oral Once   sertraline  25 mg Oral Daily   traZODone  50 mg Oral QHS   acetaminophen **OR** acetaminophen, albuterol, hydrALAZINE, ondansetron **OR** ondansetron (ZOFRAN) IV, oxyCODONE  Assessment/ Plan:  68 y.o. female with medical problems of end-stage renal disease, HD, diabetes, hypertension, history of left arm cellulitis in August 2023 was admitted on 06/12/2022 for  Principal  Problem:   Orthostatic hypotension Active Problems:   Hypertension   Type 2 diabetes mellitus (HCC)   ESRD (end stage renal disease) on dialysis (HCC)   Vomiting and diarrhea  Orthostatic hypotension [I95.1] ESRD (end stage renal disease) ([place]) [N18.6] Pre-syncope [R55]  #. ESRD with hypotension CCKA/[facility] dialysis catheter/TTS/left forearm AV graft Patient received dialysis yesterday, UF 1.5 L achieved.  Next treatment scheduled for Tuesday.  #. Anemia of CKD  Lab Results  Component Value Date   HGB 11.3 (L) 06/17/2022    Hemoglobin remains within acceptable range.  No need for ESA's at this time.  #. Secondary hyperparathyroidism of renal origin N 25.81      Component Value Date/Time   PTH 70 (H) 05/12/2020 0946   Lab Results  Component Value Date   PHOS 4.0 06/16/2022   Calcium 8.6.  Bone minerals remain acceptable at this time.  #.  Orthostatic hypotension. Antihypertensives placed on hold.  Scheduled midodrine held.  Echo shows moderate to severe mitral calcification.  Cardiology consulted.  Continue midodrine with dialysis.   LOS: 4 Madolin Twaddle 2/4/202412:39 PM  [facility] [place], [place]

## 2022-06-17 NOTE — Progress Notes (Signed)
[facility] Progress Note  Patient: Katie [name]    QHU:[id number]  DOA: 06/12/2022     Date of Service: the patient was seen and examined on 06/17/2022  Chief Complaint  Patient presents with   Dizziness   Hypotension   Brief hospital course: Katie [name] is a 67 y.o. female with medical history significant of ESRD on HD (T, Th, Sa), hypertension, who presents to the ED due to dizziness for 1 wk, associated with chills, abdominal cramps, nausea and vomiting, and diarrhea.  She states she has numerous diarrheal episodes per day but it has been nonbloody and nonmelanotic.  She states she has vomited daily but her last episode was yesterday.  Due to the symptoms, she endorses poor p.o. intake.  In addition, she has missed dialysis for the last 3 sessions due to difficulty with transportation and her current symptoms.  Today, when she went for dialysis, she noticed rapid onset dizziness and near syncope.  She denies loss of consciousness.  Due to this, EMS was called.  She denies any chest pain, shortness of breath, cough, lower extremity edema.   ED course: On arrival to the ED, patient was initially hypotensive at 99/57 with heart rate of 63.  She was saturating at 100% on room air.  Orthostatic vital signs were obtained that demonstrated a marked 44 drop in systolic blood pressure with BP of 62/39 when standing.  Patient received 1 L of normal saline.  Despite this, patient continued to be symptomatic.  [facility] contacted for admission.  Nephrology consulted.   Assessment and Plan: # Orthostatic hypotension Significantly symptomatic orthostatic hypotension in the setting of a diarrheal illness and ongoing antihypertensive use.  She appears hypovolemic on examination.   S/p 1 L of NS in the ED and  1 L during dialysis on 1/30  S/p Midodrine 5 mg once prior to dialysis, s/p midodrine 5 mg p.o. TID  Continue to hold antihypertensive medications Monitor orthostatic vital signs daily. 2/2 resting  blood pressure noticed above 150 so discontinued midodrine.  Patient is still orthostatic hypotension, SBP dropped from 160-110 and patient was feeling lightheaded. 2/4 RN was advised to check orthostatic vitals today.   Moderate to severe aortic valve stenosis Grade 3 diastolic dysfunction TTE shows LVEF 60 to 65%, LV hypertrophy, grade 3 diastolic dysfunction, moderate to severe aortic valve stenosis. Seen by cardiology, recommended no intervention, as patient has limited mobility and is not clear whether she is symptomatic.  So recommended to follow-up as an outpatient.   Acute gastroenteritis secondary to Sapovirus infection  Patient presented with 1 week of abdominal cramps, vomiting and diarrhea.  CT of the abdomen with no evidence of complications.  GI pathogen positive for Sabo virus, C. difficile negative - Zofran as needed S/p IV fluids as noted above 2/1 patient had 1 episode of diarrhea, improving. 2/4 no diarrhea since 2/4   ESRD (end stage renal disease) on dialysis  - Nephrology consulted; continue HD TTS schedule  2/2 started midodrine 10 mg p.o. every TTS hemodialysis days  Type 2 diabetes mellitus  Patient is not currently on any antiglycemic agents at home. -No indication for SSI at this time   Hypertension Patient is on amlodipine and carvedilol at home.   Given that she chronically becomes dizzy during dialysis with an acute exacerbation at this time, I suspect that she may require a higher blood pressure to allow for hemodynamic changes that will occur during dialysis - Hold home Coreg and amlodipine S/p midodrine  5 mg p.o. 3 times daily, DC'd on 2/2 due to higher resting blood pressure Patient still has orthostatic hypotension so we will continue to monitor. Monitor BP and use hydralazine p.o. or IV as per parameters   Audible cardiac murmur Prior echo showed mild aortic stenosis Follow repeat 2D echocardiogram    Body mass index is 23.48 kg/m.   Interventions:   Pressure Injury Buttocks (Active)     Location: Buttocks  Location Orientation:   Staging:   Wound Description (Comments):   Present on Admission:      Diet: Regular diet DVT Prophylaxis: Subcutaneous Heparin    Advance goals of care discussion: DNR  Family Communication: family was not present at bedside, at the time of interview.  The pt provided permission to discuss medical plan with the family. Opportunity was given to ask question and all questions were answered satisfactorily.   Disposition:  Pt is from ALF, admitted with dizziness, still has orthostatic hypotension, which precludes a safe discharge. Discharge to ALF, when clinically stable, may need 1-2 more days to improve.  Subjective: No significant events overnight, patient was lying comfortably in the bed, denies any chest pain or pressure, no any other active issues.  Patient still feels little bit dizzy while getting up and walk. RN was advised to check orthostatic vital signs today. Patient was asking about going home, recommended that if she is cleared by cardiology then we can discharge her tomorrow a.m.    Physical Exam: General: NAD, lying comfortably, Appear in no distress, affect appropriate Eyes: PERRLA ENT: Oral Mucosa Clear, moist  Neck: no JVD,  Cardiovascular: S1 and S2 Present,  Murmur audible,  Respiratory: good respiratory effort, Bilateral Air entry equal and Decreased, no Crackles, no wheezes Abdomen: Bowel Sound present, Soft and no tenderness,  Skin: no rashes Extremities: no Pedal edema, no calf tenderness Neurologic: without any new focal findings Gait not checked due to patient safety concerns  Vitals:   06/16/22 1250 06/16/22 1922 06/17/22 0432 06/17/22 1034  BP: 124/75 (!) 152/73 (!) 148/71 (!) 141/74  Pulse: 92 90 89 82  Resp: (!) 29 18 17 17   Temp: 98.3 F (36.8 C) 98.1 F (36.7 C) 98.1 F (36.7 C) 98.1 F (36.7 C)  TempSrc: Oral  Oral   SpO2: 98% 97%  98% 100%  Weight:      Height:       No intake or output data in the 24 hours ending 06/17/22 1321  Filed Weights   06/14/22 1035 06/14/22 1511 06/16/22 0853  Weight: 65.6 kg 64.5 kg 67.4 kg    Data Reviewed: I have personally reviewed and interpreted daily labs, tele strips, imagings as discussed above. I reviewed all nursing notes, pharmacy notes, vitals, pertinent old records I have discussed plan of care as described above with RN and patient/family.  CBC: Recent Labs  Lab 06/12/22 0733 06/14/22 0524 06/16/22 0321 06/17/22 0444  WBC 8.5 6.4 5.7 6.0  NEUTROABS 7.1  --   --   --   HGB 12.3 10.3* 10.4* 11.3*  HCT 39.9 32.4* 33.6* 36.2  MCV 99.8 97.6 99.7 99.7  PLT 233 177 163 166   Basic Metabolic Panel: Recent Labs  Lab 06/12/22 0733 06/13/22 0425 06/13/22 0904 06/14/22 0524 06/16/22 0321 06/17/22 0444  NA 134* 135  --  135 135 137  K 3.2* 3.1*  --  4.2 4.6 4.5  CL 100 100  --  104 101 99  CO2 20* 26  --  24 27 29   GLUCOSE 73 87  --  94 120* 92  BUN 44* 21  --  31* 41* 33*  CREATININE 6.56* 3.65*  --  4.71* 4.62* 3.60*  CALCIUM 8.1* 7.7*  --  7.9* 8.0* 8.6*  MG  --   --  1.9 1.9 1.8  --   PHOS  --  2.6  --  3.4 4.0  --     Studies: ECHOCARDIOGRAM COMPLETE  Result Date: 06/16/2022    ECHOCARDIOGRAM REPORT   [facility]:   [facility] Date of Exam: 06/16/2022 Medical Rec #:  [id number]    Height:       66.0 in Accession #:    [id number]   Weight:       148.6 lb Date of Birth:  10-02-1955   BSA:          1.763 m Patient Age:    31 years     BP:           132/61 mmHg Patient Gender: F            HR:           87 bpm. Exam Location:  [facility] Procedure: 2D Echo Indications:     Murmur R01.1  History:         Patient has prior history of Echocardiogram examinations, most                  recent 03/08/2021.  Sonographer:     Kathlen Brunswick RDCS Referring Phys:  [place] Diagnosing Phys: [place]  1. Left ventricular ejection fraction, by  estimation, is 60 to 65%. The left ventricle has normal function. The left ventricle has no regional wall motion abnormalities. There is severe concentric left ventricular hypertrophy. Left ventricular diastolic  parameters are consistent with Grade III diastolic dysfunction (restrictive).  2. Right ventricular systolic function is normal. The right ventricular size is normal.  3. Left atrial size was mildly dilated.  4. Right atrial size was mildly dilated.  5. The mitral valve is degenerative. Mild mitral valve regurgitation. No evidence of mitral stenosis. Moderate to severe mitral annular calcification.  6. The aortic valve is calcified. Aortic valve regurgitation is mild. Moderate to severe aortic valve stenosis.  7. The inferior vena cava is normal in size with greater than 50% respiratory variability, suggesting right atrial pressure of 3 mmHg. Conclusion(s)/Recommendation(s): Valvular findings as outlined below. FINDINGS  Left Ventricle: Left ventricular ejection fraction, by estimation, is 60 to 65%. The left ventricle has normal function. The left ventricle has no regional wall motion abnormalities. The left ventricular internal cavity size was normal in size. There is  severe concentric left ventricular hypertrophy. Left ventricular diastolic parameters are consistent with Grade III diastolic dysfunction (restrictive). Right Ventricle: The right ventricular size is normal. No increase in right ventricular wall thickness. Right ventricular systolic function is normal. Left Atrium: Left atrial size was mildly dilated. Right Atrium: Right atrial size was mildly dilated. Pericardium: There is no evidence of pericardial effusion. Mitral Valve: The mitral valve is degenerative in appearance. Moderate to severe mitral annular calcification. Mild mitral valve regurgitation. No evidence of mitral valve stenosis. MV peak gradient, 11.3 mmHg. The mean mitral valve gradient is 5.0 mmHg. Tricuspid Valve: The tricuspid  valve is normal in structure. Tricuspid valve regurgitation is not demonstrated. No evidence of tricuspid stenosis. Aortic Valve: The aortic valve is calcified. Aortic valve regurgitation is mild. Moderate to severe aortic stenosis  is present. Aortic valve mean gradient measures 28.8 mmHg. Aortic valve peak gradient measures 44.8 mmHg. Aortic valve area, by VTI measures 0.80 cm. Pulmonic Valve: The pulmonic valve was normal in structure. Pulmonic valve regurgitation is not visualized. No evidence of pulmonic stenosis. Aorta: The aortic root is normal in size and structure. Venous: The inferior vena cava is normal in size with greater than 50% respiratory variability, suggesting right atrial pressure of 3 mmHg. IAS/Shunts: No atrial level shunt detected by color flow Doppler.  LEFT VENTRICLE PLAX 2D LVIDd:         2.90 cm     Diastology LVIDs:         2.00 cm     LV e' medial:    4.57 cm/s LV PW:         1.80 cm     LV E/e' medial:  29.1 LV IVS:        1.65 cm     LV e' lateral:   5.77 cm/s LVOT diam:     1.80 cm     LV E/e' lateral: 23.1 LV SV:         60 LV SV Index:   34 LVOT Area:     2.54 cm  LV Volumes (MOD) LV vol d, MOD A4C: 85.6 ml LV vol s, MOD A4C: 29.5 ml LV SV MOD A4C:     85.6 ml RIGHT VENTRICLE RV Basal diam:  2.80 cm RV S prime:     13.30 cm/s TAPSE (M-mode): 2.4 cm LEFT ATRIUM           Index        RIGHT ATRIUM           Index LA diam:      2.50 cm 1.42 cm/m   RA Area:     14.50 cm LA Vol (A2C): 24.6 ml 13.96 ml/m  RA Volume:   37.20 ml  21.11 ml/m LA Vol (A4C): 67.2 ml 38.13 ml/m  AORTIC VALVE                     PULMONIC VALVE AV Area (Vmax):    0.87 cm      PV Vmax:       1.86 m/s AV Area (Vmean):   0.71 cm      PV Peak grad:  13.8 mmHg AV Area (VTI):     0.80 cm AV Vmax:           334.80 cm/s AV Vmean:          250.000 cm/s AV VTI:            0.746 m AV Peak Grad:      44.8 mmHg AV Mean Grad:      28.8 mmHg LVOT Vmax:         114.00 cm/s LVOT Vmean:        69.400 cm/s LVOT VTI:           0.234 m LVOT/AV VTI ratio: 0.31  AORTA Ao Root diam: 3.20 cm Ao Asc diam:  3.60 cm MITRAL VALVE                TRICUSPID VALVE MV Area (PHT): 3.83 cm     TV Peak grad:   36.5 mmHg MV Peak grad:  11.3 mmHg    TV Vmax:        3.02 m/s MV Mean grad:  5.0 mmHg MV Vmax:  1.68 m/s     SHUNTS MV Vmean:      106.0 cm/s   Systemic VTI:  0.23 m MV Decel Time: 198 msec     Systemic Diam: 1.80 cm MV E velocity: 133.00 cm/s MV A velocity: 165.00 cm/s MV E/A ratio:  0.81 Shaukat Khan Electronically signed by Neoma Laming Signature Date/Time: 06/16/2022/5:17:40 PM    Final     Scheduled Meds:  Chlorhexidine Gluconate Cloth  6 each Topical Q0600   diphenhydrAMINE  25 mg Intravenous Q T,Th,Sa-HD   divalproex  125 mg Oral QHS   heparin  5,000 Units Subcutaneous Q8H   midodrine  10 mg Oral Q T,Th,Sa-HD   oxyCODONE  5 mg Oral Once   sertraline  25 mg Oral Daily   traZODone  50 mg Oral QHS   Continuous Infusions: PRN Meds: acetaminophen **OR** acetaminophen, albuterol, hydrALAZINE, ondansetron **OR** ondansetron (ZOFRAN) IV, oxyCODONE  Time spent: 35 minutes  Author: Val Riles. MD [facility] 06/17/2022 1:21 PM  To reach On-call, see care teams to locate the attending and reach out to them via [email or web address]. If 7PM-7AM, please contact night-coverage If you still have difficulty reaching the attending provider, please page the [facility] (Director on Call) for [facility] on amion for assistance.

## 2022-06-17 NOTE — Plan of Care (Signed)

## 2022-06-18 DIAGNOSIS — I951 Orthostatic hypotension: Secondary | ICD-10-CM | POA: Diagnosis not present

## 2022-06-18 LAB — CBC
HCT: 31.4 % — ABNORMAL LOW (ref 36.0–46.0)
Hemoglobin: 9.7 g/dL — ABNORMAL LOW (ref 12.0–15.0)
MCH: 30.8 pg (ref 26.0–34.0)
MCHC: 30.9 g/dL (ref 30.0–36.0)
MCV: 99.7 fL (ref 80.0–100.0)
Platelets: 174 10*3/uL (ref 150–400)
RBC: 3.15 MIL/uL — ABNORMAL LOW (ref 3.87–5.11)
RDW: 15.7 % — ABNORMAL HIGH (ref 11.5–15.5)
WBC: 6.4 10*3/uL (ref 4.0–10.5)
nRBC: 0 % (ref 0.0–0.2)

## 2022-06-18 LAB — BASIC METABOLIC PANEL
Anion gap: 11 (ref 5–15)
BUN: 54 mg/dL — ABNORMAL HIGH (ref 8–23)
CO2: 25 mmol/L (ref 22–32)
Calcium: 8.2 mg/dL — ABNORMAL LOW (ref 8.9–10.3)
Chloride: 100 mmol/L (ref 98–111)
Creatinine, Ser: 4.53 mg/dL — ABNORMAL HIGH (ref 0.44–1.00)
GFR, Estimated: 10 mL/min — ABNORMAL LOW (ref >60)
Glucose, Bld: 106 mg/dL — ABNORMAL HIGH (ref 70–99)
Potassium: 4.5 mmol/L (ref 3.5–5.1)
Sodium: 136 mmol/L (ref 135–145)

## 2022-06-18 MED ORDER — MIDODRINE HCL 10 MG PO TABS: 10.0000 mg | ORAL_TABLET | ORAL | 2 refills | Status: AC

## 2022-06-18 MED ORDER — HYDRALAZINE HCL 50 MG PO TABS: 25.0000 mg | ORAL_TABLET | Freq: Three times a day (TID) | ORAL | Status: DC | PRN

## 2022-06-18 MED ORDER — GUAIFENESIN-DM 100-10 MG/5ML PO SYRP
5.0000 mL | ORAL_SOLUTION | ORAL | Status: DC | PRN
  Administered 2022-06-18: 5 mL via ORAL
  Filled 2022-06-18: qty 10

## 2022-06-18 NOTE — NC FL2 (Signed)
[place] LEVEL OF CARE FORM     IDENTIFICATION  Patient Name: Katie [name] Birthdate: [birth year] Sex: female Admission Date (Current Location): 06/12/2022  [facility] and [place] Number:  Engineering geologist and Address:         Provider Number: [id number]  Attending Physician Name and Address:  Val Riles, MD  Relative Name and Phone Number:       Current Level of Care: Hospital Recommended Level of Care: [place] Prior Approval Number:    Date Approved/Denied:   PASRR Number:    Discharge Plan: Other (Comment) (ALF)    Current Diagnoses: Patient Active Problem List   Diagnosis Date Noted   Orthostatic hypotension 06/12/2022   Vomiting and diarrhea 06/12/2022   Cellulitis of left upper extremity 12/11/2021   Type II diabetes mellitus with renal manifestations ([place]) 12/11/2021   Anemia in ESRD (end-stage renal disease) ([place]) 12/11/2021   Elevated lactic acid level 12/11/2021   Chronic diastolic CHF (congestive heart failure) ([place]) 12/11/2021   Left arm cellulitis 11/09/2021   Adjustment disorder with mixed anxiety and depressed mood 11/07/2021   Aortic stenosis 11/01/2021   Fecal impaction ([place]) 10/27/2021   Bent bone fracture of ulna 10/24/2021   Hospital acquired PNA 10/24/2021   Sepsis ([place]) 10/23/2021   Cellulitis 10/23/2021   Right flank pain 09/23/2021   Iron deficiency anemia, unspecified 03/27/2021   Allergy, unspecified, initial encounter 03/22/2021   Anaphylactic shock, unspecified, initial encounter 03/22/2021   Unspecified jaundice 03/15/2021   Allergy status to penicillin 03/14/2021   Anemia in chronic kidney disease 03/14/2021   Chronic diastolic (congestive) heart failure ([place]) 03/14/2021   Dependence on renal dialysis ([place]) 03/14/2021   Depression, unspecified 03/14/2021   Hypertensive heart and chronic kidney disease with heart failure and with stage 5 chronic kidney disease, or end stage renal disease ([place])  03/14/2021   Personal history of nicotine dependence 03/14/2021   Secondary hyperparathyroidism of renal origin ([place]) 03/14/2021   Frequent falls 03/07/2021   Financial difficulty 03/07/2021   ESRD (end stage renal disease) on dialysis ([place]) 12/23/2020   Persistent headaches 06/27/2020   ATN (acute tubular necrosis) ([place])    Pulmonary hypertension, unspecified (HCC)    Chronic kidney disease (CKD), stage IV (severe) (HCC)    Pleural effusion    Acute heart failure with preserved ejection fraction (HFpEF) ([place])    Pressure injury of skin 05/01/2020   Hyperkalemia    Volume overload 04/30/2020   Hypertension    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HCC)    Type 2 diabetes mellitus ([place])     Orientation RESPIRATION BLADDER Height & Weight     Self, Time, Situation, Place  Normal Continent Weight: 67.4 kg Height:  5\' 6"  (167.6 cm)  BEHAVIORAL SYMPTOMS/MOOD NEUROLOGICAL BOWEL NUTRITION STATUS      Continent Diet (Renal)  AMBULATORY STATUS COMMUNICATION OF NEEDS Skin   Limited Assist Verbally Skin abrasions                       Personal Care Assistance Level of Assistance              Functional Limitations Info  Sight Sight Info: Impaired        SPECIAL CARE FACTORS FREQUENCY  PT (By licensed PT), OT (By licensed OT)     PT Frequency: [place] OT Frequency: [place]            Contractures Contractures Info:  Not present    Additional Factors Info  Code Status, Allergies Code Status Info: DNR Allergies Info: Penicillin           STOP taking these medications     carvedilol 12.5 MG tablet Commonly known as: COREG    furosemide 40 MG tablet Commonly known as: LASIX           TAKE these medications     acetaminophen 500 MG tablet Commonly known as: TYLENOL Take 500 mg by mouth every 6 (six) hours as needed for moderate pain.    albuterol 108 (90 Base) MCG/ACT inhaler Commonly known as: VENTOLIN HFA Inhale 1 puff into the  lungs every 6 (six) hours as needed for wheezing.    clonazePAM 0.5 MG disintegrating tablet Commonly known as: KLONOPIN Take 0.5 mg by mouth daily as needed (agitation/anxiety).    divalproex 125 MG capsule Commonly known as: DEPAKOTE SPRINKLE Take 125 mg by mouth at bedtime.    famotidine 20 MG tablet Commonly known as: PEPCID Take 20 mg by mouth daily as needed for heartburn or indigestion.    hydrALAZINE 50 MG tablet Commonly known as: APRESOLINE Take 0.5 tablets (25 mg total) by mouth 3 (three) times daily as needed (if systolic BP greater than 716 mmHg).    midodrine 10 MG tablet Commonly known as: PROAMATINE Take 1 tablet (10 mg total) by mouth Every Tuesday,Thursday,and Saturday with dialysis. Skip the dose if SBP greater than 140 mmHg Start taking on: June 19, 2022    oxyCODONE 5 MG immediate release tablet Commonly known as: Oxy IR/ROXICODONE Take 5 mg by mouth every 4 (four) hours as needed for severe pain.    pantoprazole 40 MG tablet Commonly known as: PROTONIX Take 1 tablet (40 mg total) by mouth 2 (two) times daily.    saccharomyces boulardii 250 MG capsule Commonly known as: FLORASTOR Take 250 mg by mouth 2 (two) times daily.    senna-docusate 8.6-50 MG tablet Commonly known as: Senokot-S Take 1 tablet by mouth 2 (two) times daily as needed for mild constipation.    sertraline 25 MG tablet Commonly known as: ZOLOFT Take 25 mg by mouth daily.    topiramate 50 MG tablet Commonly known as: TOPAMAX Take 1 tablet (50 mg total) by mouth 2 (two) times daily as needed (headache).    traZODone 50 MG tablet Commonly known as: DESYREL Take 1 tablet (50 mg total) by mouth every evening.  Relevant Imaging Results:  Relevant Lab Results:   Additional Information SS# [id number]  Beverly Sessions, RN

## 2022-06-18 NOTE — TOC Transition Note (Deleted)
Transition of Care [facility]) - CM/SW Discharge Note   Patient Details  Name: Katie [name] MRN: [id number] Date of Birth: 09/27/1955  [facility] CM/SW Contact:  Beverly Sessions, RN Phone Number: 06/18/2022, 11:22 AM   Clinical Narrative:      Patient will DC to: [facility] Anticipated DC date: 06/18/22  Family notified:Bob and david Transport WU:[id number]  Per MD patient ready for DC to . RN, patient, patient's family, and facility notified of DC. Discharge Summary sent to facility. RN given number for report. DC packet on chart. Ambulance transport requested for patient.  TOC signing off.  Isaias Cowman [facility] [phone]        Patient Goals and CMS Choice      Discharge Placement                         Discharge Plan and Services Additional resources added to the After Visit Summary for                                       Social Determinants of Health (SDOH) Interventions SDOH Screenings   Food Insecurity: No Food Insecurity (06/14/2022)  Housing: [place]  (06/15/2022)  Transportation Needs: No Transportation Needs (06/14/2022)  Utilities: Not At Risk (06/14/2022)  Tobacco Use: Medium Risk (06/15/2022)     Readmission Risk Interventions    06/13/2022    4:02 PM 09/27/2021    1:44 PM 09/26/2021   12:46 PM  Readmission Risk Prevention Plan  Transportation Screening Complete  Complete  Palliative Care Screening  Not Applicable   Medication Review ([place]) Complete  Complete  HRI or [place] Complete    SW Recovery Care/Counseling Consult   Complete  Palliative Care Screening Not Applicable  Not [place] Not Applicable  Not Applicable

## 2022-06-18 NOTE — Plan of Care (Signed)
IV's removed, report called to [place] at [facility].  Discharge instructions printed and given to patient.  [place] made aware of new medications in verbal report and they are also indicated on discharge paperwork.

## 2022-06-18 NOTE — Progress Notes (Signed)
Physical Therapy Treatment Patient Details Name: Katie [name] MRN: [id number] DOB: 02-Nov-1955 Today's Date: 06/18/2022   History of Present Illness Katie [name] is a 18yoF who comes to [facility] on 1/27 for evaluation of hypotension in the setting of N/V/D x1 week x 1 week, DC from ED back to [facility] same date. Pt returns 1/30 with similar symptoms hypotension/dizziness 30 minutes post HD start, BP 64/37 mmHg. PMH: ESRD on HD TTS, DM, HTN, frequent falls, recurrent syncope, orthostatic hypotension on midodrine 5mg  TID, dog bite cellulitis 2023.    PT Comments    Pt was pleasant and motivated to participate during the session and put forth good effort throughout.  Orthostatic BPs taken as follows with BP and (HR): supine: 176/83 (79), sitting: 173/77 (86), and standing: 144/63 (87) with no adverse symptoms reported during the session.  Pt required no physical assistance during the session and was generally steady throughout with no overt LOB during standing activities.  MD notified of above orthostatic BPs.  Pt will benefit from HHPT upon discharge to safely address deficits listed in patient problem list for decreased caregiver assistance and eventual return to PLOF.     Recommendations for follow up therapy are one component of a multi-disciplinary discharge planning process, led by the attending physician.  Recommendations may be updated based on patient status, additional functional criteria and insurance authorization.  Follow Up Recommendations  Home health PT     Assistance Recommended at Discharge PRN  Patient can return home with the following A little help with walking and/or transfers;A little help with bathing/dressing/bathroom;Assist for transportation   Equipment Recommendations  None recommended by PT    Recommendations for Other Services       Precautions / Restrictions Precautions Precautions: Fall Restrictions Weight Bearing Restrictions: No Other Position/Activity  Restrictions: Prone to orthostatic hypotension     Mobility  Bed Mobility Overal bed mobility: Modified Independent             General bed mobility comments: Min extra time and effort only    Transfers Overall transfer level: Needs assistance Equipment used: Rolling walker (2 wheels) Transfers: Sit to/from Stand Sit to Stand: Supervision           General transfer comment: Pt steady with good eccentric and concentric control and stability    Ambulation/Gait Ambulation/Gait assistance: Min guard Gait Distance (Feet): 40 Feet Assistive device: Rolling walker (2 wheels) Gait Pattern/deviations: Step-through pattern, Decreased step length - right, Decreased step length - left, Trunk flexed Gait velocity: decreased     General Gait Details: Steady without LOB including amb forwards and backwards with a RW   Stairs             Wheelchair Mobility    Modified Rankin (Stroke Patients Only)       Balance Overall balance assessment: Needs assistance Sitting-balance support: Feet supported Sitting balance-Leahy Scale: Normal     Standing balance support: Bilateral upper extremity supported, During functional activity Standing balance-Leahy Scale: Good                              Cognition Arousal/Alertness: Awake/alert Behavior During Therapy: WFL for tasks assessed/performed Overall Cognitive Status: Within Functional Limits for tasks assessed  Exercises Total Joint Exercises Ankle Circles/Pumps: Strengthening, Both, 10 reps Quad Sets: Strengthening, Both, 10 reps Gluteal Sets: Strengthening, Both, 10 reps Heel Slides: Strengthening, Both, 10 reps Long Arc Quad: Strengthening, Both, 10 reps Knee Flexion: Strengthening, Both, 10 reps Marching in Standing: Strengthening, Both, 5 reps, Standing    General Comments        Pertinent Vitals/Pain Pain Assessment Pain  Assessment: No/denies pain    Home Living                          Prior Function            PT Goals (current goals can now be found in the care plan section) Progress towards PT goals: Progressing toward goals    Frequency    Min 2X/week      PT Plan Other (comment) (orthostatic BPs per above)    Co-evaluation              AM-PAC PT "6 Clicks" Mobility   Outcome Measure  Help needed turning from your back to your side while in a flat bed without using bedrails?: None Help needed moving from lying on your back to sitting on the side of a flat bed without using bedrails?: None Help needed moving to and from a bed to a chair (including a wheelchair)?: A Little Help needed standing up from a chair using your arms (e.g., wheelchair or bedside chair)?: A Little Help needed to walk in hospital room?: A Little Help needed climbing 3-5 steps with a railing? : A Little 6 Click Score: 20    End of Session Equipment Utilized During Treatment: Gait belt Activity Tolerance: Patient tolerated treatment well Patient left: in bed;with call bell/phone within reach;with bed alarm set Nurse Communication: Mobility status       Time: 9528-4132 PT Time Calculation (min) (ACUTE ONLY): 33 min  Charges:  $Gait Training: 8-22 mins $Therapeutic Exercise: 8-22 mins                     D. Scott Ladena Jacquez PT, DPT 06/18/22, 11:51 AM

## 2022-06-18 NOTE — TOC Progression Note (Addendum)
Transition of Care [facility]) - Progression Note    Patient Details  Name: Katie [name] MRN: [id number] Date of Birth: 01-07-1956  [facility]) CM/SW Contact  Beverly Sessions, RN Phone Number: 06/18/2022, 10:52 AM  Clinical Narrative:      Message sent to Anderson Malta at [facility] to see if patient has been assessed or if assessment is still pending      1100: per jennifer she is on vacation and request that I call [place] and ask for the Care manager.   I spoke with [place] and she states she will be over to assess the patient at [phone]  [phone] - Fl2 and dc summary secure emailed to [employer] at [email or web address]   Expected Discharge Plan and Services                                               Social Determinants of Health (SDOH) Interventions SDOH Screenings   Food Insecurity: No Food Insecurity (06/14/2022)  Housing: Low Risk  (06/15/2022)  Transportation Needs: No Transportation Needs (06/14/2022)  Utilities: Not At Risk (06/14/2022)  Tobacco Use: Medium Risk (06/15/2022)    Readmission Risk Interventions    06/13/2022    4:02 PM 09/27/2021    1:44 PM 09/26/2021   12:46 PM  Readmission Risk Prevention Plan  Transportation Screening Complete  Complete  Palliative Care Screening  Not Applicable   Medication Review (RN Care Manager) Complete  Complete  HRI or [place] Complete    SW Recovery Care/Counseling Consult   Complete  Palliative Care Screening Not Applicable  Not [place] Not Applicable  Not Applicable

## 2022-06-18 NOTE — TOC Transition Note (Signed)
Transition of Care [facility]) - CM/SW Discharge Note   Patient Details  Name: Katie [name] MRN: [id number] Date of Birth: 11-Sep-1955  [facility]) CM/SW Contact:  Beverly Sessions, RN Phone Number: 06/18/2022, 2:56 PM   Clinical Narrative:     Sherle Poe from [facility] has completed assessment and confirms patient can return. Catina confirms they can accept electronic fl2 [place] with [place] notified of discharge Provided bedside RN number to call report Catina to arrange transport through [employer], they will call bedside RN when they arrive         Patient Goals and CMS Choice      Discharge Placement                         Discharge Plan and Services Additional resources added to the After Visit Summary for                                       Social Determinants of Health (SDOH) Interventions SDOH Screenings   Food Insecurity: No Food Insecurity (06/14/2022)  Housing: Low Risk  (06/15/2022)  Transportation Needs: No Transportation Needs (06/14/2022)  Utilities: Not At Risk (06/14/2022)  Tobacco Use: Medium Risk (06/15/2022)     Readmission Risk Interventions    06/13/2022    4:02 PM 09/27/2021    1:44 PM 09/26/2021   12:46 PM  Readmission Risk Prevention Plan  Transportation Screening Complete  Complete  Palliative Care Screening  Not Applicable   Medication Review ([place]) Complete  Complete  HRI or [place] Complete    SW Recovery Care/Counseling Consult   Complete  Palliative Care Screening Not Applicable  Not [place] Not Applicable  Not Applicable

## 2022-06-18 NOTE — Discharge Summary (Signed)
[facility] Discharge Summary   Patient: Keyia Moretto QPY:[id number]  PCP: [facility]  Date of admission: 06/12/2022   Date of discharge:  06/18/2022     Discharge Diagnoses:  Principal Problem:   Orthostatic hypotension Active Problems:   Vomiting and diarrhea   ESRD (end stage renal disease) on dialysis ([place])   Type 2 diabetes mellitus ([place])   Hypertension   Admitted From: ALF Disposition:  ALF/ILF  Recommendations for Outpatient Follow-up:  Follow with PCP in 1 week, continue monitor BP and titrate medication accordingly.  Discontinued Coreg and Lasix due to orthostatic hypotension.  Started hydralazine 50 mg p.o. 3 times daily as needed if SBP greater than 150 mmHg.  Use midodrine 10 mg p.o. on dialysis days (skip the dose if SBP greater than 140 mmHg) Follow-up with nephrology and continue hemodialysis TTS schedule. Follow-up with cardiology in 1 to 2 weeks as an outpatient for aortic valve stenosis. Follow up LABS/TEST: Monitor orthostatic vital signs 1-2 times in a day and monitor BP 2-3 times a day.   Diet recommendation: Renal diet  Activity: The patient is advised to gradually reintroduce usual activities, as tolerated  Discharge Condition: stable  Code Status: DNR   History of present illness: As per the H and P dictated on admission Hospital Course:  Alize Borrayo is a 67 y.o. female with medical history significant of ESRD on HD (T, Th, Sa), hypertension, who presents to the ED due to dizziness for 1 wk, associated with chills, abdominal cramps, nausea and vomiting, and diarrhea.  She states she has numerous diarrheal episodes per day but it has been nonbloody and nonmelanotic.  She states she has vomited daily but her last episode was yesterday.  Due to the symptoms, she endorses poor p.o. intake.  In addition, she has missed dialysis for the last 3 sessions due to difficulty with transportation and her current symptoms.  Today, when she went for  dialysis, she noticed rapid onset dizziness and near syncope.  She denies loss of consciousness.  Due to this, EMS was called.  She denies any chest pain, shortness of breath, cough, lower extremity edema.  ED course: On arrival to the ED, patient was initially hypotensive at 99/57 with heart rate of 63.  She was saturating at 100% on room air.  Orthostatic vital signs were obtained that demonstrated a marked 44 drop in systolic blood pressure with BP of 62/39 when standing.  Patient received 1 L of normal saline.  Despite this, patient continued to be symptomatic.  TRH contacted for admission.  Nephrology consulted.   Assessment and Plan: # Orthostatic hypotension Significantly symptomatic orthostatic hypotension in the setting of a diarrheal illness and ongoing antihypertensive use.  She appeared hypovolemic on admission. S/p 1 L of NS in the ED and  1 L during dialysis on 1/30. S/p Midodrine 5 mg once prior to dialysis, s/p midodrine 5 mg p.o. TID.  Discontinued Coreg and Lasix home medications. On 2/2 resting blood pressure noticed above 150 so discontinued midodrine.  Patient still had orthostatic hypotension. On 2/5 sup 176/83 with HR 79, sitting 173/77 with HR 86, and standing 144/63 with HR 87, no symptoms reported as per PT eval. started hydralazine 50 mg p.o. 3 times daily as needed if SBP greater than 150 mmHg.  Monitor BP 2-3 times a day. # Moderate to severe aortic valve stenosis, Grade 3 diastolic dysfunction TTE shows LVEF 60 to 65%, LV hypertrophy, grade 3 diastolic dysfunction, moderate to severe aortic  valve stenosis. Seen by cardiology, recommended no intervention, as patient has limited mobility and is not clear whether she is symptomatic.  So recommended to follow-up as an outpatient. # Acute gastroenteritis secondary to Sapovirus infection  Patient presented with 1 week of abdominal cramps, vomiting and diarrhea.  CT of the abdomen with no evidence of complications. GI pathogen  positive for Sabo virus, C. difficile negative.  Symptoms resolved, no diarrhea for past 3 days.  Resumed laxatives to be used as needed. ESRD (end stage renal disease) on dialysis, Nephrology consulted; continue HD TTS schedule. On 2/2 started midodrine 10 mg p.o. every TTS hemodialysis days # Type 2 diabetes mellitus, Patient is not currently on any antiglycemic agents at home. No indication for SSI at this time.  # Hypertension, patient was on Coreg and Lasix at home which has been discontinued due to orthostatic hypotension.  Started hydralazine 50 mg p.o. 3 times daily as needed if SBP greater than 150 mmHg.  Continue to monitor BP and titrate medications accordingly.  Body mass index is 23.98 kg/m.  Nutrition Interventions:   Pressure Injury Buttocks (Active)     Location: Buttocks  Location Orientation:   Staging:   Wound Description (Comments):   Present on Admission:     Patient was seen by physical therapy, who recommended Home health, which was arranged. On the day of the discharge the patient's vitals were stable, and no other acute medical condition were reported by patient. the patient was felt safe to be discharge at ALF/ILF with Home health.  Consultants: Nephrology and cardiology Procedures: Hemodialysis  Discharge Exam: General: Appear in no distress, no Rash; Oral Mucosa Clear, moist. Cardiovascular: S1 and S2 Present,  Murmur audible due to aortic valve stenosis, Respiratory: normal respiratory effort, Bilateral Air entry present and no Crackles, no wheezes Abdomen: Bowel Sound present, Soft and no tenderness, no hernia Extremities: no Pedal edema, no calf tenderness Neurology: alert and oriented to time, place, and person affect appropriate.  Filed Weights   06/14/22 1035 06/14/22 1511 06/16/22 0853  Weight: 65.6 kg 64.5 kg 67.4 kg   Vitals:   06/18/22 0515 06/18/22 0824  BP: 127/68 (!) 154/75  Pulse: 83 72  Resp: 18 16  Temp: 98.4 F (36.9 C) 98.2 F  (36.8 C)  SpO2: 99% 99%    DISCHARGE MEDICATION: Allergies as of 06/18/2022       Reactions   Penicillins Nausea And Vomiting   Pt states "only pills" make her vomit Other reaction(s): Nausea/Vomit        Medication List     STOP taking these medications    carvedilol 12.5 MG tablet Commonly known as: COREG   furosemide 40 MG tablet Commonly known as: LASIX       TAKE these medications    acetaminophen 500 MG tablet Commonly known as: TYLENOL Take 500 mg by mouth every 6 (six) hours as needed for moderate pain.   albuterol 108 (90 Base) MCG/ACT inhaler Commonly known as: VENTOLIN HFA Inhale 1 puff into the lungs every 6 (six) hours as needed for wheezing.   clonazePAM 0.5 MG disintegrating tablet Commonly known as: KLONOPIN Take 0.5 mg by mouth daily as needed (agitation/anxiety).   divalproex 125 MG capsule Commonly known as: DEPAKOTE SPRINKLE Take 125 mg by mouth at bedtime.   famotidine 20 MG tablet Commonly known as: PEPCID Take 20 mg by mouth daily as needed for heartburn or indigestion.   hydrALAZINE 50 MG tablet Commonly known as: APRESOLINE Take  0.5 tablets (25 mg total) by mouth 3 (three) times daily as needed (if systolic BP greater than 099 mmHg).   midodrine 10 MG tablet Commonly known as: PROAMATINE Take 1 tablet (10 mg total) by mouth Every Tuesday,Thursday,and Saturday with dialysis. Skip the dose if SBP greater than 140 mmHg Start taking on: June 19, 2022   oxyCODONE 5 MG immediate release tablet Commonly known as: Oxy IR/ROXICODONE Take 5 mg by mouth every 4 (four) hours as needed for severe pain.   pantoprazole 40 MG tablet Commonly known as: PROTONIX Take 1 tablet (40 mg total) by mouth 2 (two) times daily.   saccharomyces boulardii 250 MG capsule Commonly known as: FLORASTOR Take 250 mg by mouth 2 (two) times daily.   senna-docusate 8.6-50 MG tablet Commonly known as: Senokot-S Take 1 tablet by mouth 2 (two) times daily  as needed for mild constipation.   sertraline 25 MG tablet Commonly known as: ZOLOFT Take 25 mg by mouth daily.   topiramate 50 MG tablet Commonly known as: TOPAMAX Take 1 tablet (50 mg total) by mouth 2 (two) times daily as needed (headache).   traZODone 50 MG tablet Commonly known as: DESYREL Take 1 tablet (50 mg total) by mouth every evening.       Allergies  Allergen Reactions   Penicillins Nausea And Vomiting    Pt states "only pills" make her vomit Other reaction(s): Nausea/Vomit   Discharge Instructions     Call MD for:   Complete by: As directed    Uncontrolled blood pressure   Call MD for:  difficulty breathing, headache or visual disturbances   Complete by: As directed    Call MD for:  extreme fatigue   Complete by: As directed    Call MD for:  persistant dizziness or light-headedness   Complete by: As directed    Call MD for:  persistant nausea and vomiting   Complete by: As directed    Call MD for:  severe uncontrolled pain   Complete by: As directed    Call MD for:  temperature >100.4   Complete by: As directed    Diet - low sodium heart healthy   Complete by: As directed    Discharge instructions   Complete by: As directed    Follow with PCP in 1 week, continue monitor BP and titrate medication accordingly.  Discontinued Coreg and Lasix due to orthostatic hypotension.  Started hydralazine 50 mg p.o. 3 times daily as needed if SBP greater than 150 mmHg.  Use midodrine 10 mg p.o. on dialysis days (skip the dose if SBP greater than 140 mmHg) Follow-up with nephrology and continue hemodialysis TTS schedule. Follow-up with cardiology in 1 to 2 weeks as an outpatient for aortic valve stenosis.   Increase activity slowly   Complete by: As directed        The results of significant diagnostics from this hospitalization (including imaging, microbiology, ancillary and laboratory) are listed below for reference.    Significant Diagnostic  Studies: ECHOCARDIOGRAM COMPLETE  Result Date: 06/16/2022    ECHOCARDIOGRAM REPORT   Patient Name:   [facility] Date of Exam: 06/16/2022 Medical Rec #:  [id number]    Height:       66.0 in Accession #:    [id number]   Weight:       148.6 lb Date of Birth:  [birth year]   BSA:          1.763 m Patient Age:    67 years  BP:           132/61 mmHg Patient Gender: F            HR:           87 bpm. Exam Location:  [facility] Procedure: 2D Echo Indications:     Murmur R01.1  History:         Patient has prior history of Echocardiogram examinations, most                  recent 03/08/2021.  Sonographer:     Kathlen Brunswick RDCS Referring Phys:  [place] Diagnosing Phys: [place]  1. Left ventricular ejection fraction, by estimation, is 60 to 65%. The left ventricle has normal function. The left ventricle has no regional wall motion abnormalities. There is severe concentric left ventricular hypertrophy. Left ventricular diastolic  parameters are consistent with Grade III diastolic dysfunction (restrictive).  2. Right ventricular systolic function is normal. The right ventricular size is normal.  3. Left atrial size was mildly dilated.  4. Right atrial size was mildly dilated.  5. The mitral valve is degenerative. Mild mitral valve regurgitation. No evidence of mitral stenosis. Moderate to severe mitral annular calcification.  6. The aortic valve is calcified. Aortic valve regurgitation is mild. Moderate to severe aortic valve stenosis.  7. The inferior vena cava is normal in size with greater than 50% respiratory variability, suggesting right atrial pressure of 3 mmHg. Conclusion(s)/Recommendation(s): Valvular findings as outlined below. FINDINGS  Left Ventricle: Left ventricular ejection fraction, by estimation, is 60 to 65%. The left ventricle has normal function. The left ventricle has no regional wall motion abnormalities. The left ventricular internal cavity size was normal in size. There is   severe concentric left ventricular hypertrophy. Left ventricular diastolic parameters are consistent with Grade III diastolic dysfunction (restrictive). Right Ventricle: The right ventricular size is normal. No increase in right ventricular wall thickness. Right ventricular systolic function is normal. Left Atrium: Left atrial size was mildly dilated. Right Atrium: Right atrial size was mildly dilated. Pericardium: There is no evidence of pericardial effusion. Mitral Valve: The mitral valve is degenerative in appearance. Moderate to severe mitral annular calcification. Mild mitral valve regurgitation. No evidence of mitral valve stenosis. MV peak gradient, 11.3 mmHg. The mean mitral valve gradient is 5.0 mmHg. Tricuspid Valve: The tricuspid valve is normal in structure. Tricuspid valve regurgitation is not demonstrated. No evidence of tricuspid stenosis. Aortic Valve: The aortic valve is calcified. Aortic valve regurgitation is mild. Moderate to severe aortic stenosis is present. Aortic valve mean gradient measures 28.8 mmHg. Aortic valve peak gradient measures 44.8 mmHg. Aortic valve area, by VTI measures 0.80 cm. Pulmonic Valve: The pulmonic valve was normal in structure. Pulmonic valve regurgitation is not visualized. No evidence of pulmonic stenosis. Aorta: The aortic root is normal in size and structure. Venous: The inferior vena cava is normal in size with greater than 50% respiratory variability, suggesting right atrial pressure of 3 mmHg. IAS/Shunts: No atrial level shunt detected by color flow Doppler.  LEFT VENTRICLE PLAX 2D LVIDd:         2.90 cm     Diastology LVIDs:         2.00 cm     LV e' medial:    4.57 cm/s LV PW:         1.80 cm     LV E/e' medial:  29.1 LV IVS:        1.65 cm  LV e' lateral:   5.77 cm/s LVOT diam:     1.80 cm     LV E/e' lateral: 23.1 LV SV:         60 LV SV Index:   34 LVOT Area:     2.54 cm  LV Volumes (MOD) LV vol d, MOD A4C: 85.6 ml LV vol s, MOD A4C: 29.5 ml LV SV MOD  A4C:     85.6 ml RIGHT VENTRICLE RV Basal diam:  2.80 cm RV S prime:     13.30 cm/s TAPSE (M-mode): 2.4 cm LEFT ATRIUM           Index        RIGHT ATRIUM           Index LA diam:      2.50 cm 1.42 cm/m   RA Area:     14.50 cm LA Vol (A2C): 24.6 ml 13.96 ml/m  RA Volume:   37.20 ml  21.11 ml/m LA Vol (A4C): 67.2 ml 38.13 ml/m  AORTIC VALVE                     PULMONIC VALVE AV Area (Vmax):    0.87 cm      PV Vmax:       1.86 m/s AV Area (Vmean):   0.71 cm      PV Peak grad:  13.8 mmHg AV Area (VTI):     0.80 cm AV Vmax:           334.80 cm/s AV Vmean:          250.000 cm/s AV VTI:            0.746 m AV Peak Grad:      44.8 mmHg AV Mean Grad:      28.8 mmHg LVOT Vmax:         114.00 cm/s LVOT Vmean:        69.400 cm/s LVOT VTI:          0.234 m LVOT/AV VTI ratio: 0.31  AORTA Ao Root diam: 3.20 cm Ao Asc diam:  3.60 cm MITRAL VALVE                TRICUSPID VALVE MV Area (PHT): 3.83 cm     TV Peak grad:   36.5 mmHg MV Peak grad:  11.3 mmHg    TV Vmax:        3.02 m/s MV Mean grad:  5.0 mmHg MV Vmax:       1.68 m/s     SHUNTS MV Vmean:      106.0 cm/s   Systemic VTI:  0.23 m MV Decel Time: 198 msec     Systemic Diam: 1.80 cm MV E velocity: 133.00 cm/s MV A velocity: 165.00 cm/s MV E/A ratio:  0.81 Shaukat Khan Electronically signed by Neoma Laming Signature Date/Time: 06/16/2022/5:17:40 PM    Final    CT HEAD WO CONTRAST (5MM)  Result Date: 06/12/2022 CLINICAL DATA:  Ataxia, hypotension and dizziness EXAM: CT HEAD WITHOUT CONTRAST CT CERVICAL SPINE WITHOUT CONTRAST TECHNIQUE: Multidetector CT imaging of the head and cervical spine was performed following the standard protocol without intravenous contrast. Multiplanar CT image reconstructions of the cervical spine were also generated. RADIATION DOSE REDUCTION: This exam was performed according to the departmental dose-optimization program which includes automated exposure control, adjustment of the mA and/or kV according to patient size and/or use of iterative  reconstruction technique. COMPARISON:  03/07/2021 FINDINGS: CT  HEAD FINDINGS Brain: No evidence of acute infarction, hemorrhage, hydrocephalus, extra-axial collection or mass lesion/mass effect. There is mild diffuse low-attenuation within the subcortical and periventricular white matter compatible with chronic microvascular disease. Vascular: No hyperdense vessel or unexpected calcification. Skull: Normal. Negative for fracture or focal lesion. Sinuses/Orbits: Paranasal sinuses and mastoid air cells are clear. Other: None CT CERVICAL SPINE FINDINGS Alignment: Normal slight reversal wall of normal cervical lordosis. No acute posttraumatic malalignment of the cervical spine. Skull base and vertebrae: No acute fracture. No primary bone lesion or focal pathologic process. Soft tissues and spinal canal: No prevertebral fluid or swelling. No visible canal hematoma. Disc levels: There is degenerative disc disease identified the C5-6 level. New Schmorl's node deformity is identified involving the superior endplates of C6 and C7. Upper chest: Negative. Other: None IMPRESSION: 1. No acute intracranial abnormality. 2. Chronic microvascular disease. 3. No evidence for cervical spine fracture or subluxation. 4. Cervical degenerative disc disease. Electronically Signed   By: Kerby Moors M.D.   On: 06/12/2022 12:15   CT Cervical Spine Wo Contrast  Result Date: 06/12/2022 CLINICAL DATA:  Ataxia, hypotension and dizziness EXAM: CT HEAD WITHOUT CONTRAST CT CERVICAL SPINE WITHOUT CONTRAST TECHNIQUE: Multidetector CT imaging of the head and cervical spine was performed following the standard protocol without intravenous contrast. Multiplanar CT image reconstructions of the cervical spine were also generated. RADIATION DOSE REDUCTION: This exam was performed according to the departmental dose-optimization program which includes automated exposure control, adjustment of the mA and/or kV according to patient size and/or use of  iterative reconstruction technique. COMPARISON:  03/07/2021 FINDINGS: CT HEAD FINDINGS Brain: No evidence of acute infarction, hemorrhage, hydrocephalus, extra-axial collection or mass lesion/mass effect. There is mild diffuse low-attenuation within the subcortical and periventricular white matter compatible with chronic microvascular disease. Vascular: No hyperdense vessel or unexpected calcification. Skull: Normal. Negative for fracture or focal lesion. Sinuses/Orbits: Paranasal sinuses and mastoid air cells are clear. Other: None CT CERVICAL SPINE FINDINGS Alignment: Normal slight reversal wall of normal cervical lordosis. No acute posttraumatic malalignment of the cervical spine. Skull base and vertebrae: No acute fracture. No primary bone lesion or focal pathologic process. Soft tissues and spinal canal: No prevertebral fluid or swelling. No visible canal hematoma. Disc levels: There is degenerative disc disease identified the C5-6 level. New Schmorl's node deformity is identified involving the superior endplates of C6 and C7. Upper chest: Negative. Other: None IMPRESSION: 1. No acute intracranial abnormality. 2. Chronic microvascular disease. 3. No evidence for cervical spine fracture or subluxation. 4. Cervical degenerative disc disease. Electronically Signed   By: Kerby Moors M.D.   On: 06/12/2022 12:15   CT ABDOMEN PELVIS WO CONTRAST  Result Date: 06/12/2022 CLINICAL DATA:  Abdominal pain, dialysis patient with hypotension and dizziness after 30 minutes of dialysis, diarrhea for the past day, history hypertension and end-stage renal disease EXAM: CT ABDOMEN AND PELVIS WITHOUT CONTRAST TECHNIQUE: Multidetector CT imaging of the abdomen and pelvis was performed following the standard protocol without IV contrast. RADIATION DOSE REDUCTION: This exam was performed according to the departmental dose-optimization program which includes automated exposure control, adjustment of the mA and/or kV according to  patient size and/or use of iterative reconstruction technique. COMPARISON:  06/09/2022 FINDINGS: Lower chest: Emphysematous changes with complete atelectasis of LEFT lower lobe and dependent atelectasis of posterior RIGHT lower lobe. Trace pericardial effusion. Hepatobiliary: Dependent calculi within gallbladder. Gallbladder and liver otherwise normal appearance. Pancreas: Atrophic pancreas without mass Spleen: Normal appearance Adrenals/Urinary Tract: Thickening of adrenal  glands bilaterally without discrete mass. Renal vascular calcifications. No renal mass or hydronephrosis. Ureters unremarkable. Bladder decompressed. Stomach/Bowel: Appendix not visualized. Sigmoid diverticulosis. Stomach and bowel loops otherwise normal appearance for technique Vascular/Lymphatic: Extensive atherosclerotic calcifications. Aorta normal caliber. No adenopathy. Reproductive: Uterus and adnexa unremarkable Other: No free air or free fluid. No hernia or inflammatory process. Musculoskeletal: Osseous demineralization. IMPRESSION: Cholelithiasis. Sigmoid diverticulosis without evidence of diverticulitis. Bibasilar atelectasis greater on LEFT with complete atelectasis of LEFT lower lobe. Trace pericardial effusion. No acute intra-abdominal or intrapelvic abnormalities. Electronically Signed   By: Lavonia Dana M.D.   On: 06/12/2022 12:02   DG Chest Portable 1 View  Result Date: 06/12/2022 CLINICAL DATA:  Cough. EXAM: PORTABLE CHEST 1 VIEW COMPARISON:  06/09/2022 FINDINGS: The lungs are clear without focal pneumonia, edema, pneumothorax or pleural effusion. Cardiopericardial silhouette is at upper limits of normal for size. Subsegmental atelectasis noted in the lung bases. Trace blunting of the costophrenic angles bilaterally raises the question of tiny effusions. Telemetry leads overlie the chest. IMPRESSION: Bibasilar atelectasis with possible trace bilateral pleural effusions. Electronically Signed   By: Misty Stanley M.D.   On:  06/12/2022 08:03   CT ABDOMEN PELVIS WO CONTRAST  Result Date: 06/09/2022 CLINICAL DATA:  Acute abdominal pain. EXAM: CT ABDOMEN AND PELVIS WITHOUT CONTRAST TECHNIQUE: Multidetector CT imaging of the abdomen and pelvis was performed following the standard protocol without IV contrast. RADIATION DOSE REDUCTION: This exam was performed according to the departmental dose-optimization program which includes automated exposure control, adjustment of the mA and/or kV according to patient size and/or use of iterative reconstruction technique. COMPARISON:  09/23/2021 FINDINGS: Lower chest: No acute findings. Hepatobiliary: No mass visualized on this unenhanced exam. Tiny gallstones noted. No evidence of cholecystitis or biliary ductal dilatation. Pancreas: No mass or inflammatory process visualized on this unenhanced exam. Spleen:  Within normal limits in size. Adrenals/Urinary tract: Renal vascular calcification noted bilaterally, without definite renal calculi. No evidence ureteral calculi or hydronephrosis. Unremarkable unopacified urinary bladder. Stomach/Bowel: No evidence of obstruction, inflammatory process, or abnormal fluid collections. Diverticulosis is seen mainly involving the sigmoid colon, however there is no evidence of diverticulitis. Vascular/Lymphatic: No pathologically enlarged lymph nodes identified. No evidence of abdominal aortic aneurysm. Aortic atherosclerotic calcification incidentally noted. Reproductive:  No mass or other significant abnormality. Other:  None. Musculoskeletal:  No suspicious bone lesions identified. IMPRESSION: No acute findings. Colonic diverticulosis, without radiographic evidence of diverticulitis. Cholelithiasis. No radiographic evidence of cholecystitis. Electronically Signed   By: Marlaine Hind M.D.   On: 06/09/2022 11:01   DG Chest Port 1 View  Result Date: 06/09/2022 CLINICAL DATA:  Questionable sepsis. Hypotension. Nausea, vomiting, and diarrhea for a week. EXAM:  PORTABLE CHEST 1 VIEW COMPARISON:  Chest x-ray December 03, 2021 FINDINGS: The cardiomediastinal silhouette is stable. No pneumothorax. Possible small effusions. Scarring in the lateral left lung base, stable. No overt edema. No other acute abnormalities are noted. IMPRESSION: Possible small pleural effusions. No other acute abnormalities. Electronically Signed   By: Dorise Bullion III M.D.   On: 06/09/2022 08:04    Microbiology: Recent Results (from the past 240 hour(s))  Blood Culture (routine x 2)     Status: None   Collection Time: 06/09/22  8:48 AM   Specimen: BLOOD  Result Value Ref Range Status   Specimen Description BLOOD BLOOD RIGHT HAND  Final   Special Requests   Final    BOTTLES DRAWN AEROBIC AND ANAEROBIC Blood Culture adequate volume   Culture   Final  NO GROWTH 5 DAYS Performed at [place] [facility], [address] [place], [place] [address]    Report Status 06/14/2022 FINAL  Final  Blood Culture (routine x 2)     Status: Abnormal   Collection Time: 06/09/22 10:18 AM   Specimen: BLOOD  Result Value Ref Range Status   Specimen Description   Final    BLOOD RIGHT ARM Performed at [facility], [address] [place], [place] [address]    Special Requests   Final    BOTTLES DRAWN AEROBIC AND ANAEROBIC Blood Culture adequate volume Performed at [facility], [address], [place], [place] [address]    Culture  Setup Time   Final    GRAM POSITIVE COCCI IN BOTH AEROBIC AND ANAEROBIC BOTTLES CRITICAL RESULT CALLED TO, READ BACK BY AND VERIFIED WITH: Clent Ridges RN ED @ [phone] 06/10/22 LFD    Culture (A)  Final    STAPHYLOCOCCUS HOMINIS STAPHYLOCOCCUS EPIDERMIDIS THE SIGNIFICANCE OF ISOLATING THIS ORGANISM FROM A SINGLE SET OF BLOOD CULTURES WHEN MULTIPLE SETS ARE DRAWN IS UNCERTAIN. PLEASE NOTIFY THE MICROBIOLOGY DEPARTMENT WITHIN ONE WEEK IF SPECIATION AND SENSITIVITIES ARE REQUIRED. Performed at [facility], [place] [address], [place],  [place] [address]    Report Status 06/12/2022 FINAL  Final  Blood Culture ID Panel (Reflexed)     Status: Abnormal   Collection Time: 06/09/22 10:18 AM  Result Value Ref Range Status   Enterococcus faecalis NOT DETECTED NOT DETECTED Final   Enterococcus Faecium NOT DETECTED NOT DETECTED Final   Listeria monocytogenes NOT DETECTED NOT DETECTED Final   Staphylococcus species DETECTED (A) NOT DETECTED Final    Comment: CRITICAL RESULT CALLED TO, READ BACK BY AND VERIFIED WITH: Clent Ridges RN ED @ [phone] 06/10/22 LFD    Staphylococcus aureus (BCID) NOT DETECTED NOT DETECTED Final   Staphylococcus epidermidis DETECTED (A) NOT DETECTED Final    Comment: Methicillin (oxacillin) resistant coagulase negative staphylococcus. Possible blood culture contaminant (unless isolated from more than one blood culture draw or clinical case suggests pathogenicity). No antibiotic treatment is indicated for blood  culture contaminants. CRITICAL RESULT CALLED TO, READ BACK BY AND VERIFIED WITH: Clent Ridges RN ED @ [phone] 06/10/22 LFD    Staphylococcus lugdunensis NOT DETECTED NOT DETECTED Final   Streptococcus species NOT DETECTED NOT DETECTED Final   Streptococcus agalactiae NOT DETECTED NOT DETECTED Final   Streptococcus pneumoniae NOT DETECTED NOT DETECTED Final   Streptococcus pyogenes NOT DETECTED NOT DETECTED Final   A.calcoaceticus-baumannii NOT DETECTED NOT DETECTED Final   Bacteroides fragilis NOT DETECTED NOT DETECTED Final   Enterobacterales NOT DETECTED NOT DETECTED Final   Enterobacter cloacae complex NOT DETECTED NOT DETECTED Final   Escherichia coli NOT DETECTED NOT DETECTED Final   Klebsiella aerogenes NOT DETECTED NOT DETECTED Final   Klebsiella oxytoca NOT DETECTED NOT DETECTED Final   Klebsiella pneumoniae NOT DETECTED NOT DETECTED Final   Proteus species NOT DETECTED NOT DETECTED Final   Salmonella species NOT DETECTED NOT DETECTED Final   Serratia marcescens NOT DETECTED NOT DETECTED Final    Haemophilus influenzae NOT DETECTED NOT DETECTED Final   Neisseria meningitidis NOT DETECTED NOT DETECTED Final   Pseudomonas aeruginosa NOT DETECTED NOT DETECTED Final   Stenotrophomonas maltophilia NOT DETECTED NOT DETECTED Final   Candida albicans NOT DETECTED NOT DETECTED Final   Candida auris NOT DETECTED NOT DETECTED Final   Candida glabrata NOT DETECTED NOT DETECTED Final   Candida krusei NOT DETECTED NOT DETECTED Final  Candida parapsilosis NOT DETECTED NOT DETECTED Final   Candida tropicalis NOT DETECTED NOT DETECTED Final   Cryptococcus neoformans/gattii NOT DETECTED NOT DETECTED Final   Methicillin resistance mecA/C DETECTED (A) NOT DETECTED Final    Comment: CRITICAL RESULT CALLED TO, READ BACK BY AND VERIFIED WITH: Clent Ridges RN ED @ [phone] 06/10/22 LFD Performed at [facility], [address] [place], [place] [address]   Blood culture (routine x 2)     Status: None   Collection Time: 06/12/22  7:30 AM   Specimen: BLOOD RIGHT ARM  Result Value Ref Range Status   Specimen Description BLOOD RIGHT ARM  Final   Special Requests   Final    BOTTLES DRAWN AEROBIC AND ANAEROBIC Blood Culture adequate volume   Culture   Final    NO GROWTH 5 DAYS Performed at [facility], [address] [place], [place] [address]    Report Status 06/17/2022 FINAL  Final  Resp panel by RT-PCR (RSV, Flu A&B, Covid) Anterior Nasal Swab     Status: None   Collection Time: 06/12/22  7:32 AM   Specimen: Anterior Nasal Swab  Result Value Ref Range Status   SARS Coronavirus 2 by RT PCR NEGATIVE NEGATIVE Final    Comment: (NOTE) SARS-CoV-2 target nucleic acids are NOT DETECTED.  The SARS-CoV-2 RNA is generally detectable in upper respiratory specimens during the acute phase of infection. The lowest concentration of SARS-CoV-2 viral copies this assay can detect is 138 copies/mL. A negative result does not preclude SARS-Cov-2 infection and should not be used as the sole basis for  treatment or other patient management decisions. A negative result may occur with  improper specimen collection/handling, submission of specimen other than nasopharyngeal swab, presence of viral mutation(s) within the areas targeted by this assay, and inadequate number of viral copies(<138 copies/mL). A negative result must be combined with clinical observations, patient history, and epidemiological information. The expected result is Negative.  Fact Sheet for Patients:  EntrepreneurPulse.com.au  Fact Sheet for Healthcare Providers:  IncredibleEmployment.be  This test is no t yet approved or cleared by the [place] FDA and  has been authorized for detection and/or diagnosis of SARS-CoV-2 by FDA under an Emergency Use Authorization (EUA). This EUA will remain  in effect (meaning this test can be used) for the duration of the COVID-19 declaration under Section 564(b)(1) of the Act, 21 U.S.C.section 360bbb-3(b)(1), unless the authorization is terminated  or revoked sooner.       Influenza A by PCR NEGATIVE NEGATIVE Final   Influenza B by PCR NEGATIVE NEGATIVE Final    Comment: (NOTE) The Xpert Xpress SARS-CoV-2/FLU/RSV plus assay is intended as an aid in the diagnosis of influenza from Nasopharyngeal swab specimens and should not be used as a sole basis for treatment. Nasal washings and aspirates are unacceptable for Xpert Xpress SARS-CoV-2/FLU/RSV testing.  Fact Sheet for Patients: EntrepreneurPulse.com.au  Fact Sheet for Healthcare Providers: IncredibleEmployment.be  This test is not yet approved or cleared by the [place] FDA and has been authorized for detection and/or diagnosis of SARS-CoV-2 by FDA under an Emergency Use Authorization (EUA). This EUA will remain in effect (meaning this test can be used) for the duration of the COVID-19 declaration under Section 564(b)(1) of the Act, 21  U.S.C. section 360bbb-3(b)(1), unless the authorization is terminated or revoked.     Resp Syncytial Virus by PCR NEGATIVE NEGATIVE Final    Comment: (NOTE) Fact Sheet for Patients: EntrepreneurPulse.com.au  Fact Sheet for Healthcare  Providers: IncredibleEmployment.be  This test is not yet approved or cleared by the [place] and has been authorized for detection and/or diagnosis of SARS-CoV-2 by FDA under an Emergency Use Authorization (EUA). This EUA will remain in effect (meaning this test can be used) for the duration of the COVID-19 declaration under Section 564(b)(1) of the Act, 21 U.S.C. section 360bbb-3(b)(1), unless the authorization is terminated or revoked.  Performed at [facility], [address] [place], [place] [address]   Blood culture (routine x 2)     Status: None   Collection Time: 06/12/22  9:59 PM   Specimen: BLOOD RIGHT HAND  Result Value Ref Range Status   Specimen Description BLOOD RIGHT HAND  Final   Special Requests   Final    BOTTLES DRAWN AEROBIC AND ANAEROBIC Blood Culture adequate volume   Culture   Final    NO GROWTH 5 DAYS Performed at [facility], [address] [place], [place] [address]    Report Status 06/17/2022 FINAL  Final  Gastrointestinal Panel by PCR , Stool     Status: Abnormal   Collection Time: 06/14/22  2:07 AM   Specimen: STOOL  Result Value Ref Range Status   Campylobacter species NOT DETECTED NOT DETECTED Final   Plesimonas shigelloides NOT DETECTED NOT DETECTED Final   Salmonella species NOT DETECTED NOT DETECTED Final   Yersinia enterocolitica NOT DETECTED NOT DETECTED Final   Vibrio species NOT DETECTED NOT DETECTED Final   Vibrio cholerae NOT DETECTED NOT DETECTED Final   Enteroaggregative E coli (EAEC) NOT DETECTED NOT DETECTED Final   Enteropathogenic E coli (EPEC) NOT DETECTED NOT DETECTED Final   Enterotoxigenic E coli (ETEC) NOT DETECTED NOT  DETECTED Final   Shiga like toxin producing E coli (STEC) NOT DETECTED NOT DETECTED Final   Shigella/Enteroinvasive E coli (EIEC) NOT DETECTED NOT DETECTED Final   Cryptosporidium NOT DETECTED NOT DETECTED Final   Cyclospora cayetanensis NOT DETECTED NOT DETECTED Final   Entamoeba histolytica NOT DETECTED NOT DETECTED Final   Giardia lamblia NOT DETECTED NOT DETECTED Final   Adenovirus F40/41 NOT DETECTED NOT DETECTED Final   Astrovirus NOT DETECTED NOT DETECTED Final   Norovirus GI/GII NOT DETECTED NOT DETECTED Final   Rotavirus A NOT DETECTED NOT DETECTED Final   Sapovirus (I, II, IV, and V) DETECTED (A) NOT DETECTED Final    Comment: Performed at [facility], [address] [place], [place] [address]  C Difficile Quick Screen w PCR reflex     Status: None   Collection Time: 06/14/22  2:07 AM   Specimen: STOOL  Result Value Ref Range Status   C Diff antigen NEGATIVE NEGATIVE Final   C Diff toxin NEGATIVE NEGATIVE Final   C Diff interpretation No C. difficile detected.  Final    Comment: Performed at [facility], [address] [place], [place] [address]     Labs: CBC: Recent Labs  Lab 06/12/22 [phone] 06/14/22 0524 06/16/22 0321 06/17/22 0444 06/18/22 0325  WBC 8.5 6.4 5.7 6.0 6.4  NEUTROABS 7.1  --   --   --   --   HGB 12.3 10.3* 10.4* 11.3* 9.7*  HCT 39.9 32.4* 33.6* 36.2 31.4*  MCV 99.8 97.6 99.7 99.7 99.7  PLT 233 177 163 183 213   Basic Metabolic Panel: Recent Labs  Lab 06/13/22 0425 06/13/22 0904 06/14/22 0524 06/16/22 0321 06/17/22 0444 06/18/22 0325  NA 135  --  135 135 137 136  K 3.1*  --  4.2 4.6 4.5 4.5  CL 100  --  104 101 99 100  CO2 26  --  24 27 29 25   GLUCOSE 87  --  94 120* 92 106*  BUN 21  --  31* 41* 33* 54*  CREATININE 3.65*  --  4.71* 4.62* 3.60* 4.53*  CALCIUM 7.7*  --  7.9* 8.0* 8.6* 8.2*  MG  --  1.9 1.9 1.8  --   --   PHOS 2.6  --  3.4 4.0  --   --    Liver Function Tests: Recent Labs  Lab 06/12/22 0733  06/13/22 0425  AST 11*  --   ALT 9  --   ALKPHOS 104  --   BILITOT 0.9  --   PROT 7.0  --   ALBUMIN 3.5 2.8*   Recent Labs  Lab 06/12/22 0733  LIPASE 37   No results for input(s): "AMMONIA" in the last 168 hours. Cardiac Enzymes: No results for input(s): "CKTOTAL", "CKMB", "CKMBINDEX", "TROPONINI" in the last 168 hours. BNP (last 3 results) No results for input(s): "BNP" in the last 8760 hours. CBG: No results for input(s): "GLUCAP" in the last 168 hours.  Time spent: 35 minutes  Signed:  Val Riles  [facility]  06/18/2022 11:27 AM

## 2022-06-18 NOTE — Progress Notes (Signed)
[facility], [name] 06/18/22  Subjective:   LOS: 5  Patient known to our practice from previous admissions.    Patient seen sitting at side of bed, eating breakfast Alert and oriented Room air Reports mild lower back pain No lower extremity edema  Objective:  Vital signs in last 24 hours:  Temp:  [98.1 F (36.7 C)-98.5 F (36.9 C)] 98.2 F (36.8 C) (02/05 0824) Pulse Rate:  [72-89] 72 (02/05 0824) Resp:  [16-18] 16 (02/05 0824) BP: (113-177)/(59-81) 154/75 (02/05 0824) SpO2:  [98 %-100 %] 99 % (02/05 0824)  Weight change:  Filed Weights   06/14/22 1035 06/14/22 1511 06/16/22 0853  Weight: 65.6 kg 64.5 kg 67.4 kg    Intake/Output:    Intake/Output Summary (Last 24 hours) at 06/18/2022 1447 Last data filed at 06/17/2022 2100 Gross per 24 hour  Intake --  Output 200 ml  Net -200 ml     Physical Exam: General:  No acute distress, laying in the bed  HEENT  anicteric, moist oral mucous membrane  Pulm/lungs  normal breathing effort, lungs are clear to auscultation  CVS/Heart  regular rhythm, no rub or gallop  Abdomen:   Soft, nontender  Extremities:  No peripheral edema  Neurologic:  Alert, oriented, able to follow commands  Skin:  No acute rashes  Left arm AV fistula.     Basic Metabolic Panel:  Recent Labs  Lab 06/13/22 0425 06/13/22 0904 06/14/22 0524 06/16/22 0321 06/17/22 0444 06/18/22 0325  NA 135  --  135 135 137 136  K 3.1*  --  4.2 4.6 4.5 4.5  CL 100  --  104 101 99 100  CO2 26  --  24 27 29 25   GLUCOSE 87  --  94 120* 92 106*  BUN 21  --  31* 41* 33* 54*  CREATININE 3.65*  --  4.71* 4.62* 3.60* 4.53*  CALCIUM 7.7*  --  7.9* 8.0* 8.6* 8.2*  MG  --  1.9 1.9 1.8  --   --   PHOS 2.6  --  3.4 4.0  --   --       CBC: Recent Labs  Lab 06/12/22 0733 06/14/22 0524 06/16/22 0321 06/17/22 0444 06/18/22 0325  WBC 8.5 6.4 5.7 6.0 6.4  NEUTROABS 7.1  --   --   --   --   HGB 12.3 10.3* 10.4* 11.3* 9.7*  HCT  39.9 32.4* 33.6* 36.2 31.4*  MCV 99.8 97.6 99.7 99.7 99.7  PLT 233 177 163 183 174       Lab Results  Component Value Date   HEPBSAG NON REACTIVE 06/12/2022   HEPBSAB NON REACTIVE 11/04/2021   HEPBIGM NON REACTIVE 12/19/2021      Microbiology:  Recent Results (from the past 240 hour(s))  Blood Culture (routine x 2)     Status: None   Collection Time: 06/09/22  8:48 AM   Specimen: BLOOD  Result Value Ref Range Status   Specimen Description BLOOD BLOOD RIGHT HAND  Final   Special Requests   Final    BOTTLES DRAWN AEROBIC AND ANAEROBIC Blood Culture adequate volume   Culture   Final    NO GROWTH 5 DAYS Performed at [facility], [address] [place], [place] [address]    Report Status 06/14/2022 FINAL  Final  Blood Culture (routine x 2)     Status: Abnormal   Collection Time: 06/09/22 10:18 AM   Specimen: BLOOD  Result Value Ref  Range Status   Specimen Description   Final    BLOOD RIGHT ARM Performed at [facility], [address] [place], [place] [address]    Special Requests   Final    BOTTLES DRAWN AEROBIC AND ANAEROBIC Blood Culture adequate volume Performed at [facility], [address] [place], [place] [address]    Culture  Setup Time   Final    GRAM POSITIVE COCCI IN BOTH AEROBIC AND ANAEROBIC BOTTLES CRITICAL RESULT CALLED TO, READ BACK BY AND VERIFIED WITH: Clent Ridges RN ED @ [phone] 06/10/22 LFD    Culture (A)  Final    STAPHYLOCOCCUS HOMINIS STAPHYLOCOCCUS EPIDERMIDIS THE SIGNIFICANCE OF ISOLATING THIS ORGANISM FROM A SINGLE SET OF BLOOD CULTURES WHEN MULTIPLE SETS ARE DRAWN IS UNCERTAIN. PLEASE NOTIFY THE MICROBIOLOGY DEPARTMENT WITHIN ONE WEEK IF SPECIATION AND SENSITIVITIES ARE REQUIRED. Performed at [facility], [place] [address] [place], [place] [address]    Report Status 06/12/2022 FINAL  Final  Blood Culture ID Panel (Reflexed)     Status: Abnormal   Collection Time: 06/09/22 10:18 AM  Result Value Ref  Range Status   Enterococcus faecalis NOT DETECTED NOT DETECTED Final   Enterococcus Faecium NOT DETECTED NOT DETECTED Final   Listeria monocytogenes NOT DETECTED NOT DETECTED Final   Staphylococcus species DETECTED (A) NOT DETECTED Final    Comment: CRITICAL RESULT CALLED TO, READ BACK BY AND VERIFIED WITH: Clent Ridges RN ED @ [phone] 06/10/22 LFD    Staphylococcus aureus (BCID) NOT DETECTED NOT DETECTED Final   Staphylococcus epidermidis DETECTED (A) NOT DETECTED Final    Comment: Methicillin (oxacillin) resistant coagulase negative staphylococcus. Possible blood culture contaminant (unless isolated from more than one blood culture draw or clinical case suggests pathogenicity). No antibiotic treatment is indicated for blood  culture contaminants. CRITICAL RESULT CALLED TO, READ BACK BY AND VERIFIED WITH: Clent Ridges RN ED @ [phone] 06/10/22 LFD    Staphylococcus lugdunensis NOT DETECTED NOT DETECTED Final   Streptococcus species NOT DETECTED NOT DETECTED Final   Streptococcus agalactiae NOT DETECTED NOT DETECTED Final   Streptococcus pneumoniae NOT DETECTED NOT DETECTED Final   Streptococcus pyogenes NOT DETECTED NOT DETECTED Final   A.calcoaceticus-baumannii NOT DETECTED NOT DETECTED Final   Bacteroides fragilis NOT DETECTED NOT DETECTED Final   Enterobacterales NOT DETECTED NOT DETECTED Final   Enterobacter cloacae complex NOT DETECTED NOT DETECTED Final   Escherichia coli NOT DETECTED NOT DETECTED Final   Klebsiella aerogenes NOT DETECTED NOT DETECTED Final   Klebsiella oxytoca NOT DETECTED NOT DETECTED Final   Klebsiella pneumoniae NOT DETECTED NOT DETECTED Final   Proteus species NOT DETECTED NOT DETECTED Final   Salmonella species NOT DETECTED NOT DETECTED Final   Serratia marcescens NOT DETECTED NOT DETECTED Final   Haemophilus influenzae NOT DETECTED NOT DETECTED Final   Neisseria meningitidis NOT DETECTED NOT DETECTED Final   Pseudomonas aeruginosa NOT DETECTED NOT DETECTED Final    Stenotrophomonas maltophilia NOT DETECTED NOT DETECTED Final   Candida albicans NOT DETECTED NOT DETECTED Final   Candida auris NOT DETECTED NOT DETECTED Final   Candida glabrata NOT DETECTED NOT DETECTED Final   Candida krusei NOT DETECTED NOT DETECTED Final   Candida parapsilosis NOT DETECTED NOT DETECTED Final   Candida tropicalis NOT DETECTED NOT DETECTED Final   Cryptococcus neoformans/gattii NOT DETECTED NOT DETECTED Final   Methicillin resistance mecA/C DETECTED (A) NOT DETECTED Final    Comment: CRITICAL RESULT CALLED TO, READ BACK BY AND VERIFIED WITH: Clent Ridges RN  ED @ [phone] 06/10/22 LFD Performed at [facility], [address] [place], [place] [address]   Blood culture (routine x 2)     Status: None   Collection Time: 06/12/22  7:30 AM   Specimen: BLOOD RIGHT ARM  Result Value Ref Range Status   Specimen Description BLOOD RIGHT ARM  Final   Special Requests   Final    BOTTLES DRAWN AEROBIC AND ANAEROBIC Blood Culture adequate volume   Culture   Final    NO GROWTH 5 DAYS Performed at [facility], [address] [place], [place] [address]    Report Status 06/17/2022 FINAL  Final  Resp panel by RT-PCR (RSV, Flu A&B, Covid) Anterior Nasal Swab     Status: None   Collection Time: 06/12/22  7:32 AM   Specimen: Anterior Nasal Swab  Result Value Ref Range Status   SARS Coronavirus 2 by RT PCR NEGATIVE NEGATIVE Final    Comment: (NOTE) SARS-CoV-2 target nucleic acids are NOT DETECTED.  The SARS-CoV-2 RNA is generally detectable in upper respiratory specimens during the acute phase of infection. The lowest concentration of SARS-CoV-2 viral copies this assay can detect is 138 copies/mL. A negative result does not preclude SARS-Cov-2 infection and should not be used as the sole basis for treatment or other patient management decisions. A negative result may occur with  improper specimen collection/handling, submission of specimen other than nasopharyngeal  swab, presence of viral mutation(s) within the areas targeted by this assay, and inadequate number of viral copies(<138 copies/mL). A negative result must be combined with clinical observations, patient history, and epidemiological information. The expected result is Negative.  Fact Sheet for Patients:  EntrepreneurPulse.com.au  Fact Sheet for Healthcare Providers:  IncredibleEmployment.be  This test is no t yet approved or cleared by the [place] FDA and  has been authorized for detection and/or diagnosis of SARS-CoV-2 by FDA under an Emergency Use Authorization (EUA). This EUA will remain  in effect (meaning this test can be used) for the duration of the COVID-19 declaration under Section 564(b)(1) of the Act, 21 U.S.C.section 360bbb-3(b)(1), unless the authorization is terminated  or revoked sooner.       Influenza A by PCR NEGATIVE NEGATIVE Final   Influenza B by PCR NEGATIVE NEGATIVE Final    Comment: (NOTE) The Xpert Xpress SARS-CoV-2/FLU/RSV plus assay is intended as an aid in the diagnosis of influenza from Nasopharyngeal swab specimens and should not be used as a sole basis for treatment. Nasal washings and aspirates are unacceptable for Xpert Xpress SARS-CoV-2/FLU/RSV testing.  Fact Sheet for Patients: EntrepreneurPulse.com.au  Fact Sheet for Healthcare Providers: IncredibleEmployment.be  This test is not yet approved or cleared by the [place] FDA and has been authorized for detection and/or diagnosis of SARS-CoV-2 by FDA under an Emergency Use Authorization (EUA). This EUA will remain in effect (meaning this test can be used) for the duration of the COVID-19 declaration under Section 564(b)(1) of the Act, 21 U.S.C. section 360bbb-3(b)(1), unless the authorization is terminated or revoked.     Resp Syncytial Virus by PCR NEGATIVE NEGATIVE Final    Comment: (NOTE) Fact Sheet for  Patients: EntrepreneurPulse.com.au  Fact Sheet for Healthcare Providers: IncredibleEmployment.be  This test is not yet approved or cleared by the [place] FDA and has been authorized for detection and/or diagnosis of SARS-CoV-2 by FDA under an Emergency Use Authorization (EUA). This EUA will remain in effect (meaning this test can be used) for the duration of the COVID-19  declaration under Section 564(b)(1) of the Act, 21 U.S.C. section 360bbb-3(b)(1), unless the authorization is terminated or revoked.  Performed at [facility], [address] [place], [place] [address]   Blood culture (routine x 2)     Status: None   Collection Time: 06/12/22  9:59 PM   Specimen: BLOOD RIGHT HAND  Result Value Ref Range Status   Specimen Description BLOOD RIGHT HAND  Final   Special Requests   Final    BOTTLES DRAWN AEROBIC AND ANAEROBIC Blood Culture adequate volume   Culture   Final    NO GROWTH 5 DAYS Performed at [facility], [address] [place], [place] [address]    Report Status 06/17/2022 FINAL  Final  Gastrointestinal Panel by PCR , Stool     Status: Abnormal   Collection Time: 06/14/22  2:07 AM   Specimen: STOOL  Result Value Ref Range Status   Campylobacter species NOT DETECTED NOT DETECTED Final   Plesimonas shigelloides NOT DETECTED NOT DETECTED Final   Salmonella species NOT DETECTED NOT DETECTED Final   Yersinia enterocolitica NOT DETECTED NOT DETECTED Final   Vibrio species NOT DETECTED NOT DETECTED Final   Vibrio cholerae NOT DETECTED NOT DETECTED Final   Enteroaggregative E coli (EAEC) NOT DETECTED NOT DETECTED Final   Enteropathogenic E coli (EPEC) NOT DETECTED NOT DETECTED Final   Enterotoxigenic E coli (ETEC) NOT DETECTED NOT DETECTED Final   Shiga like toxin producing E coli (STEC) NOT DETECTED NOT DETECTED Final   Shigella/Enteroinvasive E coli (EIEC) NOT DETECTED NOT DETECTED Final   Cryptosporidium  NOT DETECTED NOT DETECTED Final   Cyclospora cayetanensis NOT DETECTED NOT DETECTED Final   Entamoeba histolytica NOT DETECTED NOT DETECTED Final   Giardia lamblia NOT DETECTED NOT DETECTED Final   Adenovirus F40/41 NOT DETECTED NOT DETECTED Final   Astrovirus NOT DETECTED NOT DETECTED Final   Norovirus GI/GII NOT DETECTED NOT DETECTED Final   Rotavirus A NOT DETECTED NOT DETECTED Final   Sapovirus (I, II, IV, and V) DETECTED (A) NOT DETECTED Final    Comment: Performed at [facility], [address] [place], [place] [address]  C Difficile Quick Screen w PCR reflex     Status: None   Collection Time: 06/14/22  2:07 AM   Specimen: STOOL  Result Value Ref Range Status   C Diff antigen NEGATIVE NEGATIVE Final   C Diff toxin NEGATIVE NEGATIVE Final   C Diff interpretation No C. difficile detected.  Final    Comment: Performed at [facility], [address] [place], [place] [address]    Coagulation Studies: No results for input(s): "LABPROT", "INR" in the last 72 hours.  Urinalysis: No results for input(s): "COLORURINE", "LABSPEC", "PHURINE", "GLUCOSEU", "HGBUR", "BILIRUBINUR", "KETONESUR", "PROTEINUR", "UROBILINOGEN", "NITRITE", "LEUKOCYTESUR" in the last 72 hours.  Invalid input(s): "APPERANCEUR"    Imaging: ECHOCARDIOGRAM COMPLETE  Result Date: 06/16/2022    ECHOCARDIOGRAM REPORT   [facility] Date of Exam: 06/16/2022 Medical Rec #:  [id number]    Height:       66.0 in Accession #:    [id number]   Weight:       148.6 lb Date of Birth:  21-Jun-1955   BSA:          1.763 m Patient Age:    67 years     BP:           132/61 mmHg Patient Gender: F  HR:           87 bpm. Exam Location:  [facility] Procedure: 2D Echo Indications:     Murmur R01.1  History:         Patient has prior history of Echocardiogram examinations, most                  recent 03/08/2021.  Sonographer:     Kathlen Brunswick RDCS Referring Phys:  [place] Diagnosing  Phys: [place]  1. Left ventricular ejection fraction, by estimation, is 60 to 65%. The left ventricle has normal function. The left ventricle has no regional wall motion abnormalities. There is severe concentric left ventricular hypertrophy. Left ventricular diastolic  parameters are consistent with Grade III diastolic dysfunction (restrictive).  2. Right ventricular systolic function is normal. The right ventricular size is normal.  3. Left atrial size was mildly dilated.  4. Right atrial size was mildly dilated.  5. The mitral valve is degenerative. Mild mitral valve regurgitation. No evidence of mitral stenosis. Moderate to severe mitral annular calcification.  6. The aortic valve is calcified. Aortic valve regurgitation is mild. Moderate to severe aortic valve stenosis.  7. The inferior vena cava is normal in size with greater than 50% respiratory variability, suggesting right atrial pressure of 3 mmHg. Conclusion(s)/Recommendation(s): Valvular findings as outlined below. FINDINGS  Left Ventricle: Left ventricular ejection fraction, by estimation, is 60 to 65%. The left ventricle has normal function. The left ventricle has no regional wall motion abnormalities. The left ventricular internal cavity size was normal in size. There is  severe concentric left ventricular hypertrophy. Left ventricular diastolic parameters are consistent with Grade III diastolic dysfunction (restrictive). Right Ventricle: The right ventricular size is normal. No increase in right ventricular wall thickness. Right ventricular systolic function is normal. Left Atrium: Left atrial size was mildly dilated. Right Atrium: Right atrial size was mildly dilated. Pericardium: There is no evidence of pericardial effusion. Mitral Valve: The mitral valve is degenerative in appearance. Moderate to severe mitral annular calcification. Mild mitral valve regurgitation. No evidence of mitral valve stenosis. MV peak gradient, 11.3 mmHg.  The mean mitral valve gradient is 5.0 mmHg. Tricuspid Valve: The tricuspid valve is normal in structure. Tricuspid valve regurgitation is not demonstrated. No evidence of tricuspid stenosis. Aortic Valve: The aortic valve is calcified. Aortic valve regurgitation is mild. Moderate to severe aortic stenosis is present. Aortic valve mean gradient measures 28.8 mmHg. Aortic valve peak gradient measures 44.8 mmHg. Aortic valve area, by VTI measures 0.80 cm. Pulmonic Valve: The pulmonic valve was normal in structure. Pulmonic valve regurgitation is not visualized. No evidence of pulmonic stenosis. Aorta: The aortic root is normal in size and structure. Venous: The inferior vena cava is normal in size with greater than 50% respiratory variability, suggesting right atrial pressure of 3 mmHg. IAS/Shunts: No atrial level shunt detected by color flow Doppler.  LEFT VENTRICLE PLAX 2D LVIDd:         2.90 cm     Diastology LVIDs:         2.00 cm     LV e' medial:    4.57 cm/s LV PW:         1.80 cm     LV E/e' medial:  29.1 LV IVS:        1.65 cm     LV e' lateral:   5.77 cm/s LVOT diam:     1.80 cm     LV E/e' lateral: 23.1  LV SV:         60 LV SV Index:   34 LVOT Area:     2.54 cm  LV Volumes (MOD) LV vol d, MOD A4C: 85.6 ml LV vol s, MOD A4C: 29.5 ml LV SV MOD A4C:     85.6 ml RIGHT VENTRICLE RV Basal diam:  2.80 cm RV S prime:     13.30 cm/s TAPSE (M-mode): 2.4 cm LEFT ATRIUM           Index        RIGHT ATRIUM           Index LA diam:      2.50 cm 1.42 cm/m   RA Area:     14.50 cm LA Vol (A2C): 24.6 ml 13.96 ml/m  RA Volume:   37.20 ml  21.11 ml/m LA Vol (A4C): 67.2 ml 38.13 ml/m  AORTIC VALVE                     PULMONIC VALVE AV Area (Vmax):    0.87 cm      PV Vmax:       1.86 m/s AV Area (Vmean):   0.71 cm      PV Peak grad:  13.8 mmHg AV Area (VTI):     0.80 cm AV Vmax:           334.80 cm/s AV Vmean:          250.000 cm/s AV VTI:            0.746 m AV Peak Grad:      44.8 mmHg AV Mean Grad:      28.8 mmHg LVOT  Vmax:         114.00 cm/s LVOT Vmean:        69.400 cm/s LVOT VTI:          0.234 m LVOT/AV VTI ratio: 0.31  AORTA Ao Root diam: 3.20 cm Ao Asc diam:  3.60 cm MITRAL VALVE                TRICUSPID VALVE MV Area (PHT): 3.83 cm     TV Peak grad:   36.5 mmHg MV Peak grad:  11.3 mmHg    TV Vmax:        3.02 m/s MV Mean grad:  5.0 mmHg MV Vmax:       1.68 m/s     SHUNTS MV Vmean:      106.0 cm/s   Systemic VTI:  0.23 m MV Decel Time: 198 msec     Systemic Diam: 1.80 cm MV E velocity: 133.00 cm/s MV A velocity: 165.00 cm/s MV E/A ratio:  0.81 Shaukat Khan Electronically signed by Neoma Laming Signature Date/Time: 06/16/2022/5:17:40 PM    Final      Medications:      Chlorhexidine Gluconate Cloth  6 each Topical Q0600   diphenhydrAMINE  25 mg Intravenous Q T,Th,Sa-HD   divalproex  125 mg Oral QHS   heparin  5,000 Units Subcutaneous Q8H   midodrine  10 mg Oral Q T,Th,Sa-HD   oxyCODONE  5 mg Oral Once   sertraline  25 mg Oral Daily   traZODone  50 mg Oral QHS   acetaminophen **OR** acetaminophen, albuterol, guaiFENesin-dextromethorphan, hydrALAZINE, ondansetron **OR** ondansetron (ZOFRAN) IV, oxyCODONE  Assessment/ Plan:  67 y.o. female with medical problems of end-stage renal disease, HD, diabetes, hypertension, history of left arm cellulitis in August 2023 was admitted on 06/12/2022 for  Principal Problem:   Orthostatic hypotension Active Problems:   Hypertension   Type 2 diabetes mellitus (HCC)   ESRD (end stage renal disease) on dialysis (HCC)   Vomiting and diarrhea  Orthostatic hypotension [I95.1] ESRD (end stage renal disease) ([place]) [N18.6] Pre-syncope [R55]  #. ESRD with hypotension [facility]/[place] dialysis catheter/TTS/left forearm AV graft Next treatment scheduled for Tuesday.   #. Anemia of CKD  Lab Results  Component Value Date   HGB 9.7 (L) 06/18/2022   Hemoglobin within optimal target, will hold ESA at this time.   #. Secondary hyperparathyroidism of renal origin N  25.81      Component Value Date/Time   PTH 70 (H) 05/12/2020 0946   Lab Results  Component Value Date   PHOS 4.0 06/16/2022   Will continue to monitor bone minerals during this admission.   #.  Orthostatic hypotension. Antihypertensives placed on hold.  Scheduled midodrine held.  Echo on 06/16/22 shows LVEF 60 to 65%, severe concentric LVH, grade 3 diastolic dysfunction, moderate to severe mitral valve calcification but no stenosis, Moderate to severe aortic stenosis which may also be playing a role..  Cardiology consulted.  Continue midodrine with dialysis.   LOS: [place] 2/5/20242:47 PM  [facility] [place], [place]

## 2022-06-25 ENCOUNTER — Non-Acute Institutional Stay: Payer: Medicaid Other | Admitting: Hospice

## 2022-06-25 DIAGNOSIS — R52 Pain, unspecified: Secondary | ICD-10-CM

## 2022-06-25 DIAGNOSIS — N186 End stage renal disease: Secondary | ICD-10-CM

## 2022-06-25 DIAGNOSIS — R531 Weakness: Secondary | ICD-10-CM

## 2022-06-25 DIAGNOSIS — Z515 Encounter for palliative care: Secondary | ICD-10-CM

## 2022-06-25 DIAGNOSIS — R110 Nausea: Secondary | ICD-10-CM

## 2022-06-25 NOTE — Progress Notes (Signed)
    [facility] Pall[facility] Consult Note Telephone: [phone]  Fax: [phone]   PATIENT NAME: Katie [name] [address] [place] [address]   [phone] (home)  DOB: 14-Jul-1955 MRN: [id number] PRIMARY CARE PROVIDER:    Lajuana Ripple, NP   REFERRING PROVIDER:   Lajuana Ripple, NP   RESPONSIBLE PARTY:    Contact Information     Name Relation Home Work Mobile   [place] Sister   [phone]        I met face to face with patient in the facility. Palliative Care was asked to follow this patient by consultation to address advance care planning and complex medical decision making. This is a follow up visit.                                   ASSESSMENT AND PLAN / RECOMMENDATIONS:   Goals of Care: Goals include to maximize quality of life and symptom management.  CODE STATUS: DNR  Symptom Management/Plan:  ESRD- hemodialysis Tuesday-Thursday-Saturday. She has been tolerating sessions.  Education on need to adhere to dialysis schedules. Weakness-PT/OT for strengthening.  Discussion on balance of rest and performance activity.  Fall precautions.   Nausea-occasional nausea; continue PRN ondansetron.  Pain: Generalized,  managed with Oxycodone.  Follow up Palliative Care Visit: Palliative care will continue to follow for complex medical decision making, advance care planning, and clarification of goals. Return in 8 weeks or prn.  PPS: 50%  HOSPICE ELIGIBILITY/DIAGNOSIS: TBD  Chief Complaint: Palliative Medicine follow up visit.   HISTORY OF PRESENT ILLNESS:  Katie [name] is a 67 y.o. year old female  with ESRD on hemodialysis, hypertension, T2DM, anemia, hypertensive heart and kidney disease, chronic diastolic HF, depression.  Patient endorsed weakness which she stated is related to when she goes for dialysis but she recovers by the next day.  She reports pain is well-managed with current pain regimen; no nausea, no  diarrhea, in no acute distress.  History obtained from review of EMR, discussion with primary team, and interview with family, facility staff/caregiver and/or Katie [name]. A 10-Point ROS is negative, except for the pertinent positives/negatives detailed in the HPI.  I reviewed available labs, medications, imaging, studies and related documents from the EMR.  Records reviewed and summarized above.   I spent 40 minutes providing this consultation; this includes time spent with patient/family, chart review and documentation. More than 50% of the time in this consultation was spent on counseling and coordinating communication   Thank you for the opportunity to participate in the care of Katie [name]. Please call our office at [phone] if we can be of additional assistance.   Note: Portions of this note were generated with [employer]. Dictation errors may occur despite best attempts at proofreading.   Teodoro Spray, NP

## 2022-07-17 IMAGING — US US RENAL
1 series · 14 of 25 positions shown · non-contrast
Comparison: None.

CLINICAL DATA: Active tubular necrosis.

EXAM:
RENAL / URINARY TRACT ULTRASOUND COMPLETE

[Series 1: us renal · 14 of 27 slices shown]
[im 1/27]
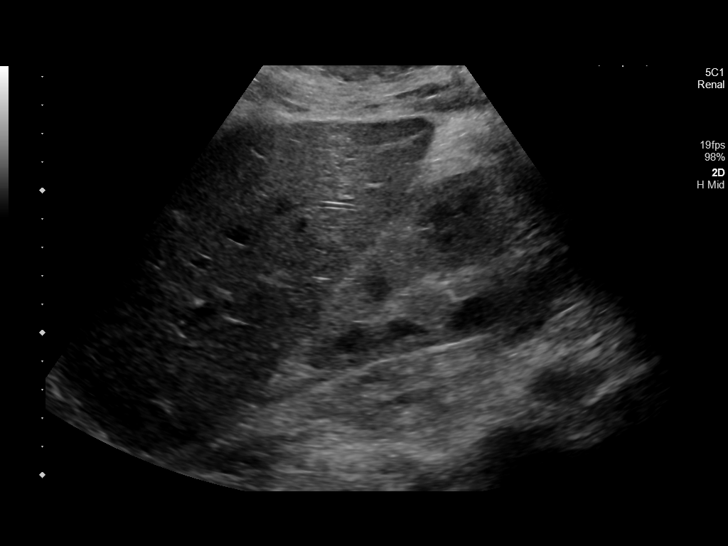
[im 3/27]
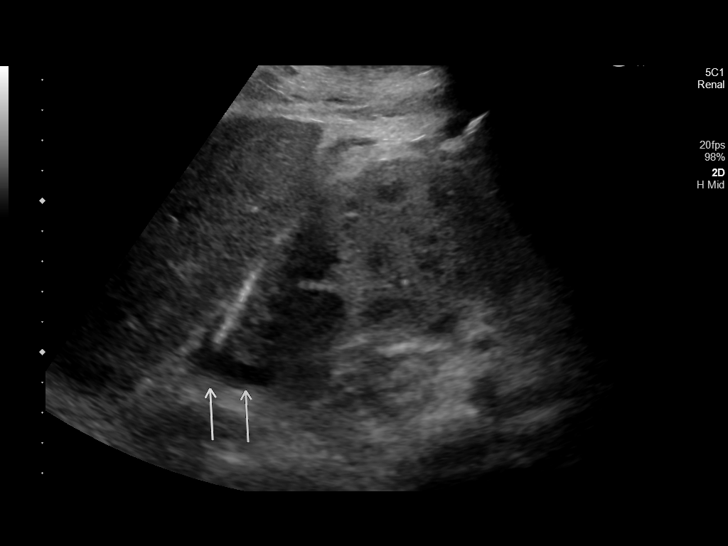
[im 5/27]
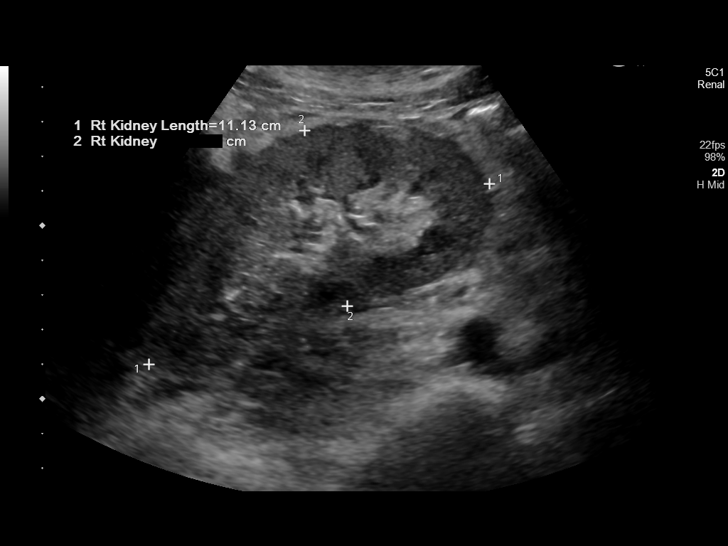
[im 7/27]
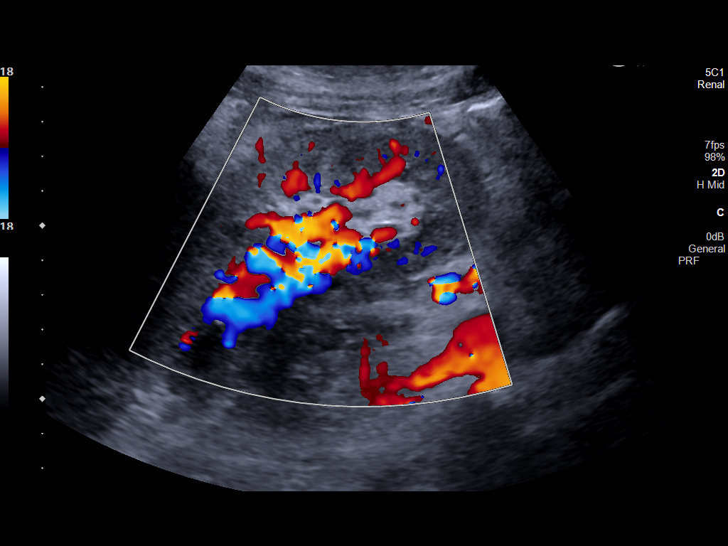
[im 9/27]
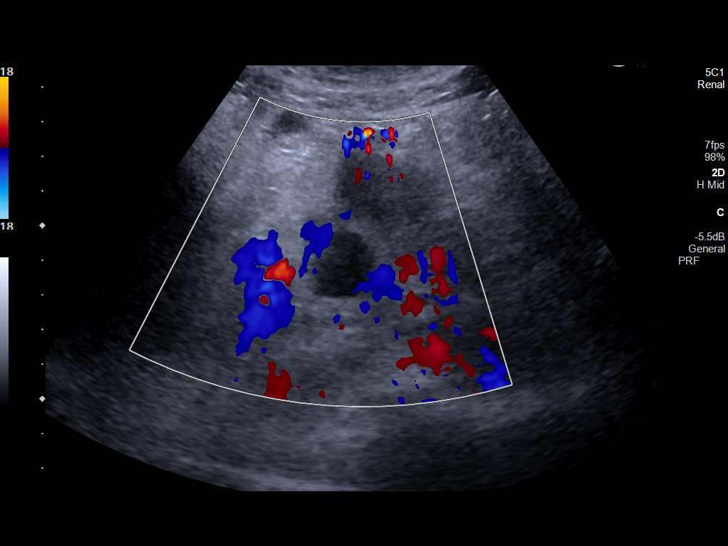
[im 10/27]
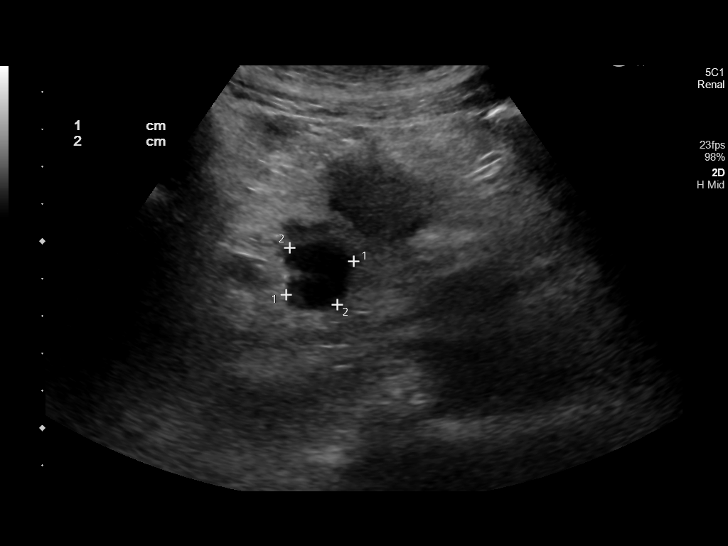
[im 12/27]
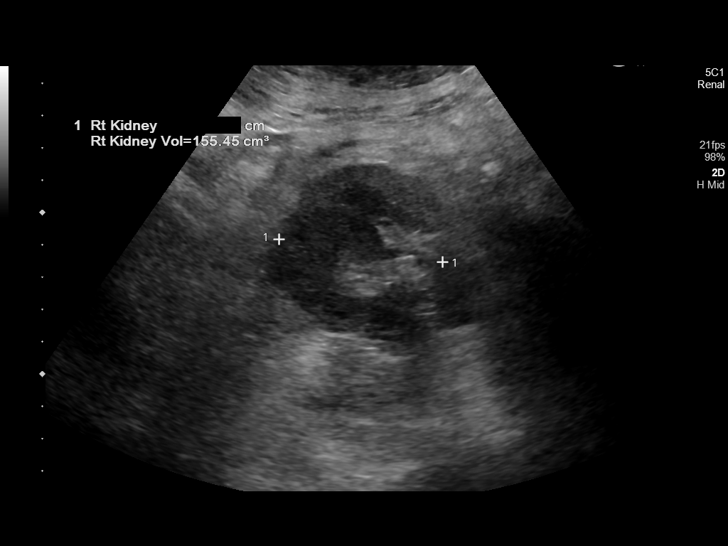
[im 15/27]
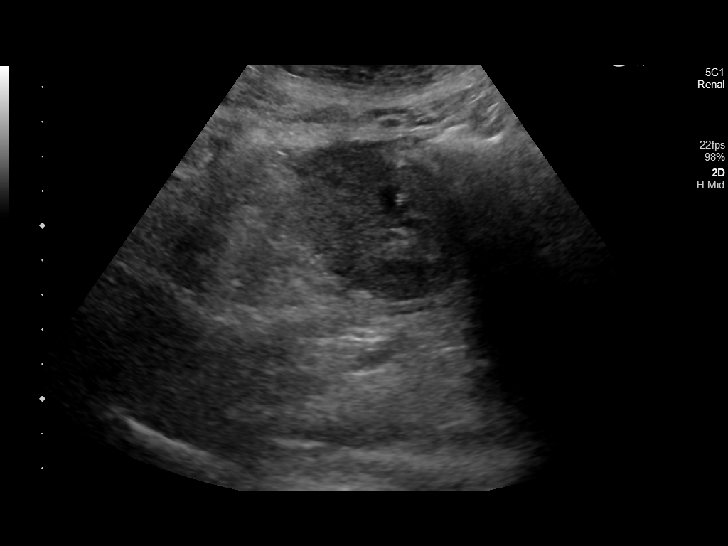
[im 17/27]
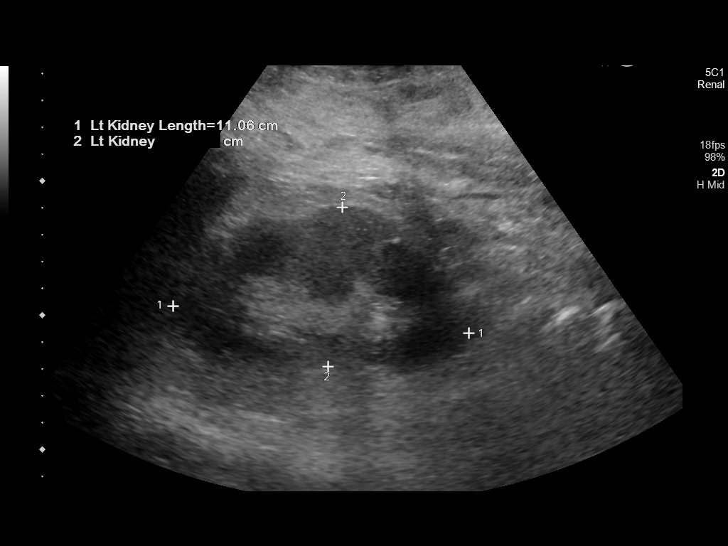
[im 18/27]
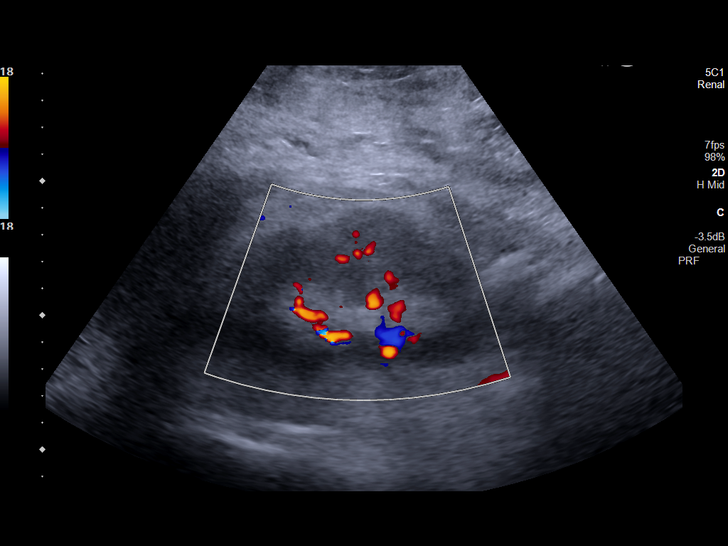
[im 20/27]
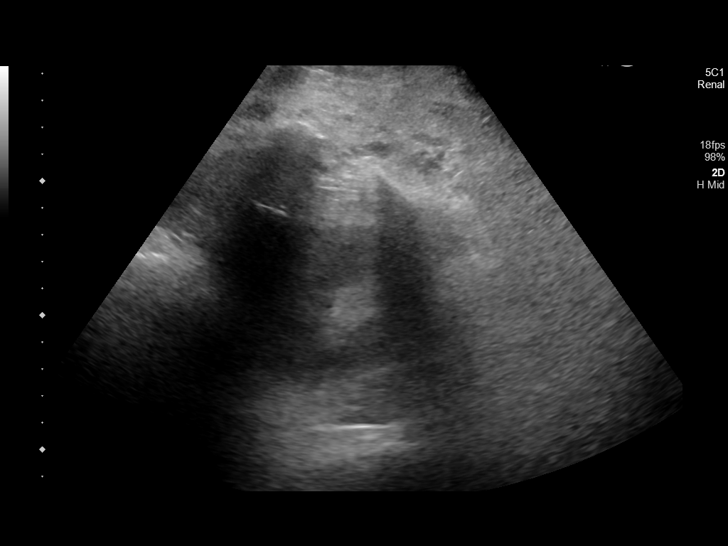
[im 22/27]
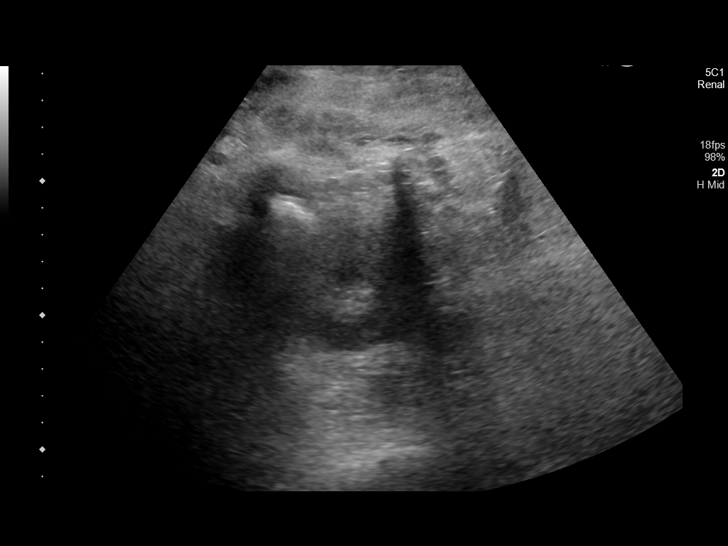
[im 24/27]
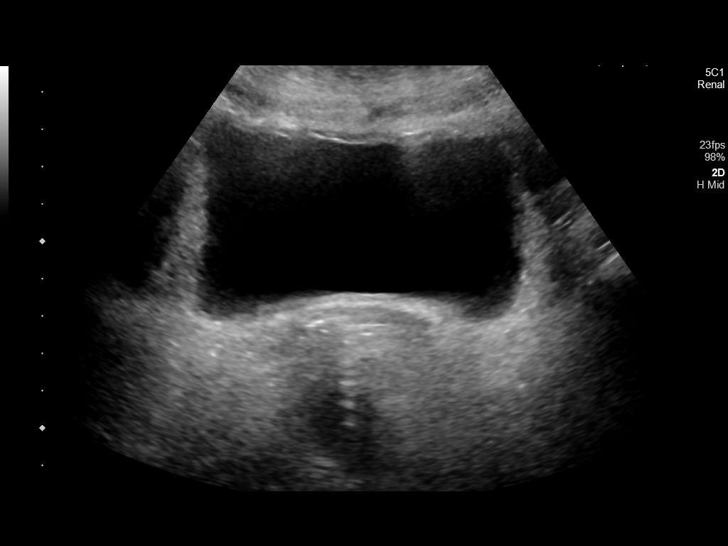
[im 27/27]
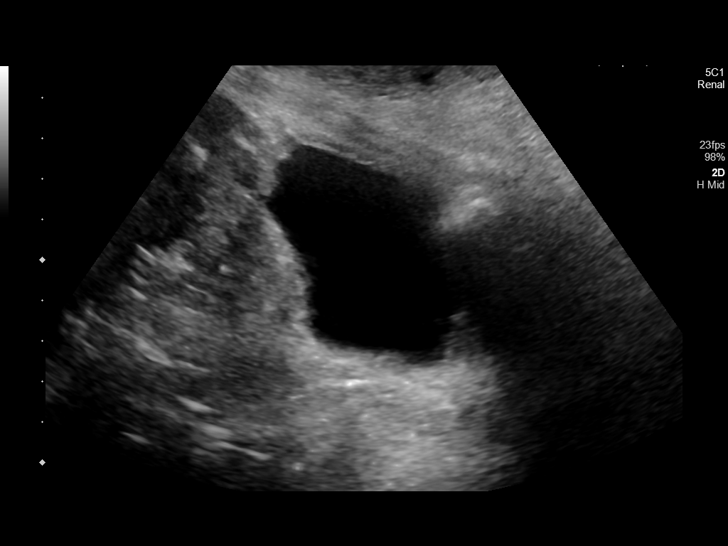

[14 of 25 positions shown; findings below may reference images not displayed]

FINDINGS: Right Kidney:

Renal measurements: 11.1 x 5.2 x 5.1 cm = volume: 155 mL. There is
no hydronephrosis. There is increased cortical echogenicity. There
is a cyst in the interpolar region of the kidney.

Left Kidney:

Renal measurements: 11.1 x 6 x 5.4 cm = volume: 187 mL. There is no
hydronephrosis. There is increased cortical echogenicity.

Bladder:

There is debris within the urinary bladder. Both ureteral jets were
visualized.

Other:

Incidentally noted was a right-sided pleural effusion.
IMPRESSION: 1. No hydronephrosis.
2. Echogenic kidneys which can be seen in patients with medical
renal disease.
3. Incidentally noted right-sided pleural effusion.
4. Debris within the urinary bladder. This is nonspecific and should
be correlated with urinalysis.

## 2022-07-18 ENCOUNTER — Non-Acute Institutional Stay: Payer: Medicaid Other | Admitting: Hospice

## 2022-07-18 DIAGNOSIS — R52 Pain, unspecified: Secondary | ICD-10-CM

## 2022-07-18 DIAGNOSIS — R531 Weakness: Secondary | ICD-10-CM

## 2022-07-18 DIAGNOSIS — Z515 Encounter for palliative care: Secondary | ICD-10-CM

## 2022-07-18 DIAGNOSIS — N186 End stage renal disease: Secondary | ICD-10-CM

## 2022-07-18 DIAGNOSIS — R110 Nausea: Secondary | ICD-10-CM

## 2022-07-18 NOTE — Progress Notes (Signed)
    [facility]er, [facility] Palliative Care Consult Note Telephone: [phone]  Fax: [phone]   PATIENT NAME: Katie [name] [address] [place] [address]   [phone] (home)  DOB: 18-Oct-1955 MRN: [id number] PRIMARY CARE PROVIDER:    Lajuana Ripple, NP   REFERRING PROVIDER:   Lajuana Ripple, NP   RESPONSIBLE PARTY:    Contact Information     Name Relation Home Work Mobile   [place] Sister   [phone]        I met face to face with patient in the facility. Palliative Care was asked to follow this patient by consultation to address advance care planning and complex medical decision making. This is a follow up visit.                                   ASSESSMENT AND PLAN / RECOMMENDATIONS:   Goals of Care: Goals include to maximize quality of life and symptom management.  CODE STATUS: DNR  Symptom Management/Plan:  ESRD-Continue with hemodialysis Tuesday-Thursday-Saturday as planned.   Weakness-recently completed PT/OT for strengthening.  Continue activities to optimize wellbeing.  Balance of rest and performance activity fall precautions.   Nausea-occasional nausea; continue PRN ondansetron.  Pain: Generalized,  managed with Oxycodone.  Follow up Palliative Care Visit: Palliative care will continue to follow for complex medical decision making, advance care planning, and clarification of goals. Return in 8 weeks or prn.  PPS: 50%  HOSPICE ELIGIBILITY/DIAGNOSIS: TBD  Chief Complaint: Palliative Medicine follow up visit.   HISTORY OF PRESENT ILLNESS:  Katie [name] is a 67 y.o. year old female  with ESRD on hemodialysis, hypertension, T2DM, anemia, hypertensive heart and kidney disease, chronic diastolic HF, depression.  She reports doing well overall, pain is well-managed with current pain regimen; no nausea, no diarrhea, in no acute distress.  History obtained from review of EMR, discussion with primary team, and  interview with family, facility staff/caregiver and/or Katie [name]. A 10-Point ROS is negative, except for the pertinent positives/negatives detailed in the HPI.  I reviewed available labs, medications, imaging, studies and related documents from the EMR.  Records reviewed and summarized above.   I spent 40 minutes providing this consultation; this includes time spent with patient/family, chart review and documentation. More than 50% of the time in this consultation was spent on counseling and coordinating communication   Thank you for the opportunity to participate in the care of Katie [name]. Please call our office at [phone] if we can be of additional assistance.   Note: Portions of this note were generated with Lobbyist. Dictation errors may occur despite best attempts at proofreading.   Teodoro Spray, NP

## 2022-07-24 IMAGING — CR DG CHEST 2V
1 series · 2 of 2 positions shown · non-contrast
Comparison: 05/02/2020

CLINICAL DATA: Status post thoracentesis.

EXAM:
CHEST - 2 VIEW

[Series 1: dg chest 2 view · 0.14mm/px · 2 of 2 slices shown]
[im 1/2]
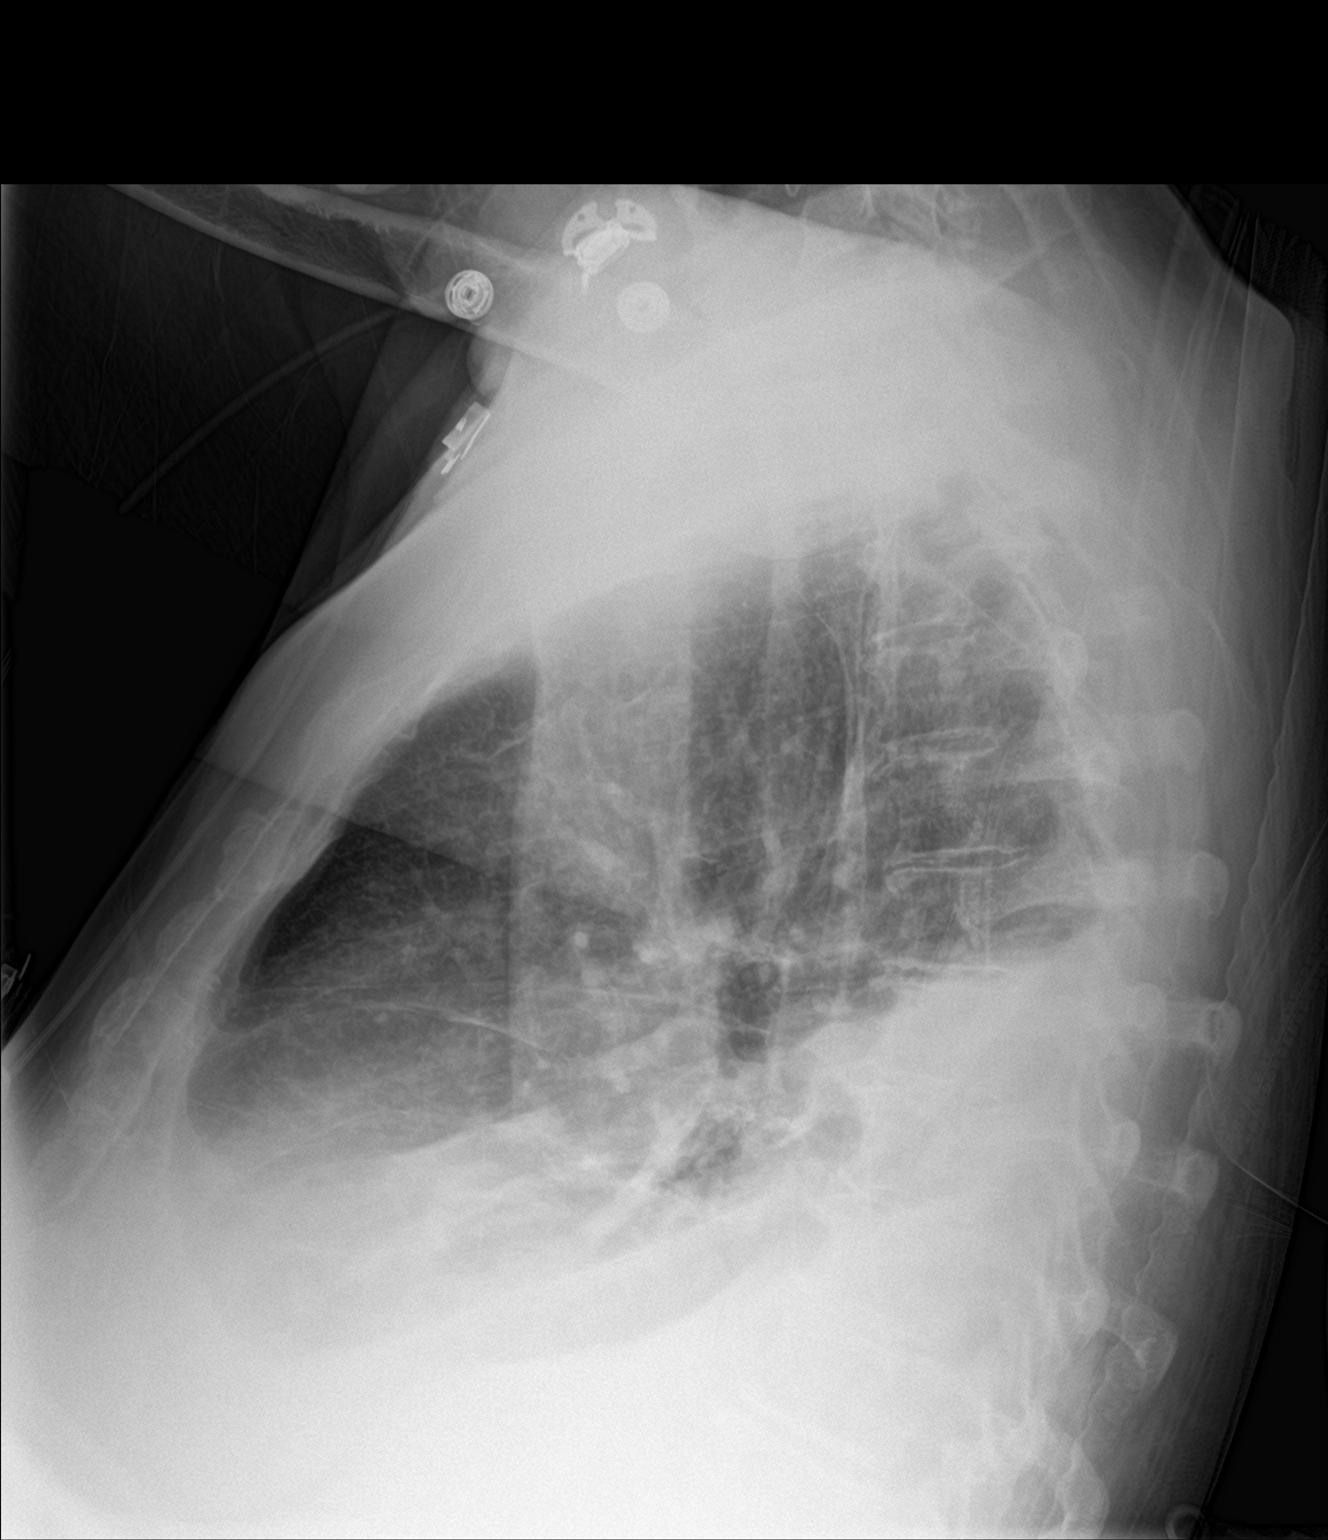
[im 2/2]
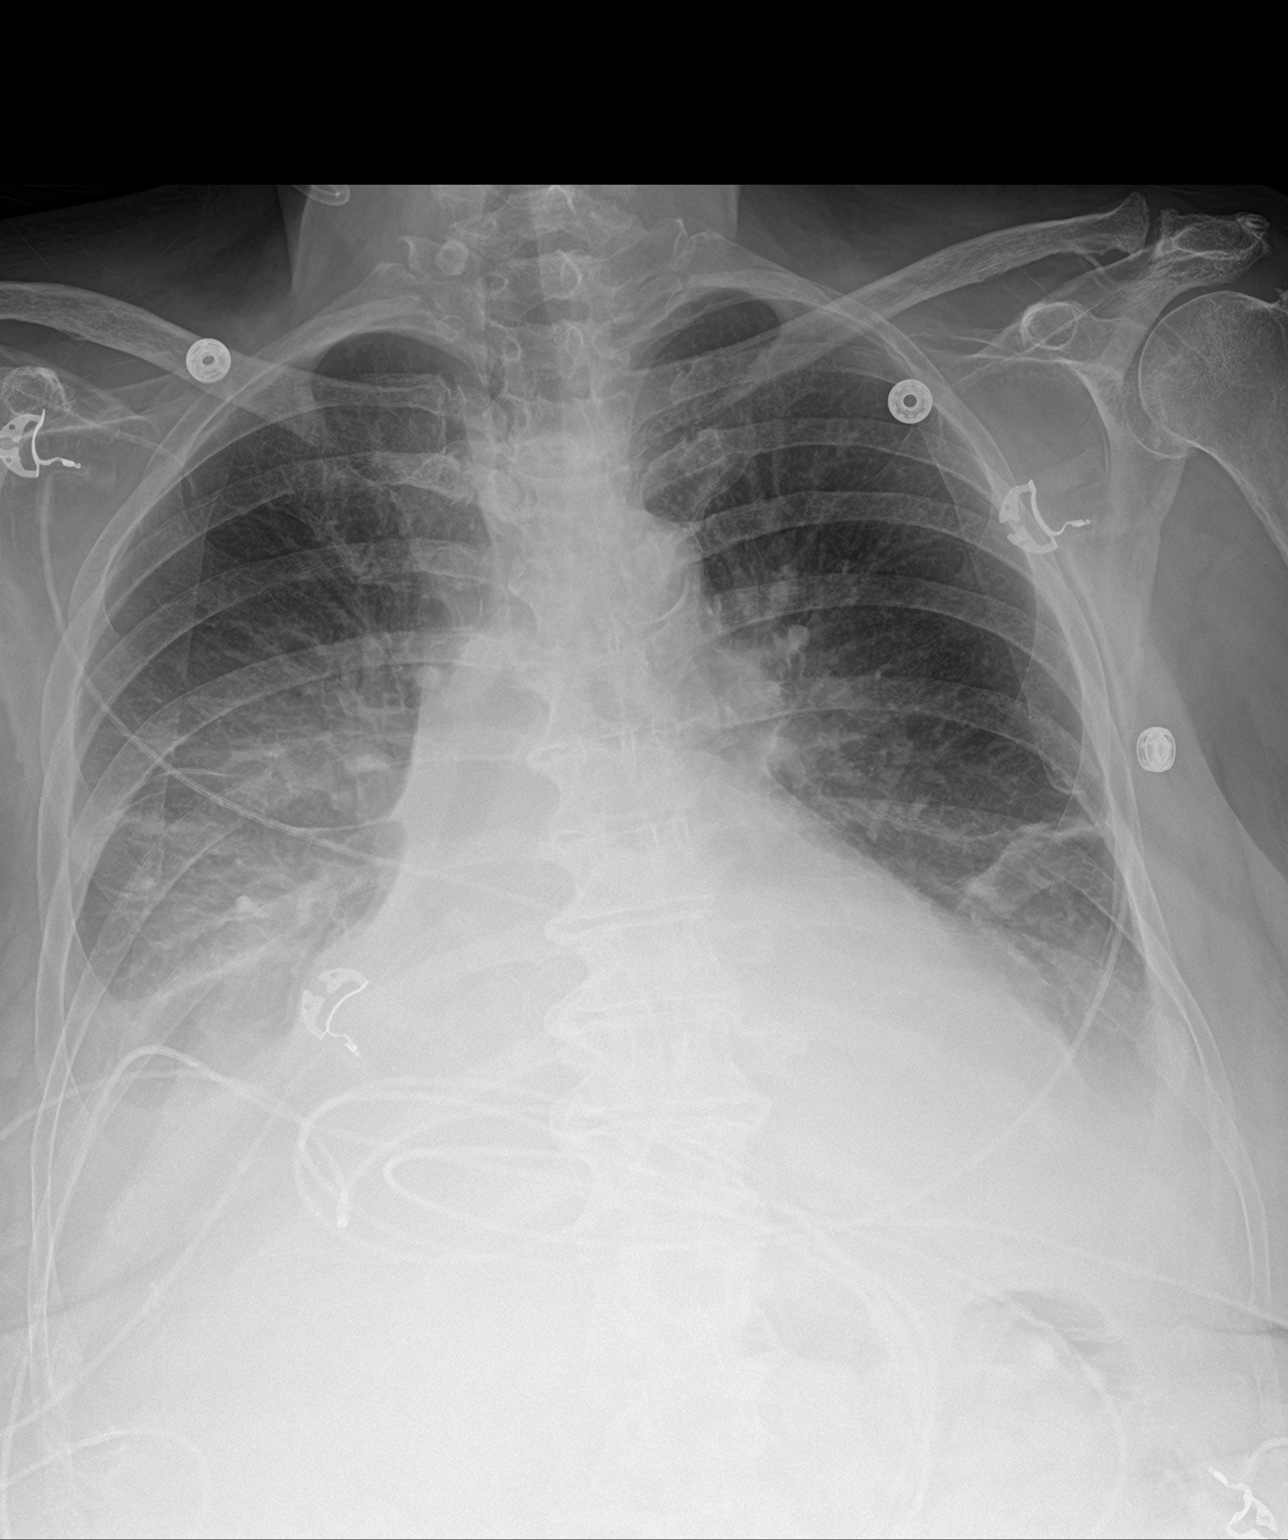

[2 of 2 positions shown; findings below may reference images not displayed]

FINDINGS: The cardio pericardial silhouette is enlarged. Bibasilar
atelectasis/infiltrate associated small to moderate bilateral
pleural effusions. No pneumothorax. Bones are diffusely
demineralized.
IMPRESSION: Cardiomegaly with bibasilar collapse/consolidative opacity and small
to moderate bilateral pleural effusions. Interval increase in right
pleural fluid volume.

## 2022-09-05 ENCOUNTER — Non-Acute Institutional Stay: Payer: Medicaid Other | Admitting: Hospice

## 2022-09-05 DIAGNOSIS — N186 End stage renal disease: Secondary | ICD-10-CM

## 2022-09-05 DIAGNOSIS — R52 Pain, unspecified: Secondary | ICD-10-CM

## 2022-09-05 DIAGNOSIS — Z515 Encounter for palliative care: Secondary | ICD-10-CM

## 2022-09-05 DIAGNOSIS — R110 Nausea: Secondary | ICD-10-CM

## 2022-09-05 DIAGNOSIS — R531 Weakness: Secondary | ICD-10-CM

## 2022-09-05 NOTE — Progress Notes (Signed)
    Therapist, nutritional Palliative Care Consult Note Telephone: [phone]  Fax: [phone]   PATIENT NAME: Katie [name] [address] [place] [address]   [phone] (home)  DOB: [birth year] MRN: [id number] PRIMARY CARE PROVIDER:    Christoper Allegra, NP   REFERRING PROVIDER:   Christoper Allegra, NP   RESPONSIBLE PARTY:    Contact Information     Name Relation Home Work Mobile   [place] Sister   [phone]        I met face to face with patient in the facility. Palliative Care was asked to follow this patient by consultation to address advance care planning and complex medical decision making. This is a follow up visit.  Visit consisted of counseling and education dealing with the complex and emotionally intense issues of symptom management and palliative care in the setting of serious and potentially life-threatening illness. Palliative care team will continue to support patient, patient's family, and medical team.                                   ASSESSMENT AND PLAN / RECOMMENDATIONS:   Goals of Care: Goals include to maximize quality of life and symptom management.  CODE STATUS: DNR  Symptom Management/Plan: Patient is fairly stable no changes to plan of care ESRD-Continue with hemodialysis Tuesday-Thursday-Saturday as planned.  Weakness-: Improved.  Continue activities to optimize wellbeing.  Balance of rest and performance activity fall precautions.    Nausea-occasional nausea; continue PRN ondansetron.   Pain: Generalized,  managed with Oxycodone.   Follow up Palliative Care Visit: Palliative care will continue to follow for chronic disease progression, complex medical decision making, advance care planning, and clarification of goals. Return in 8 weeks or prn.  PPS: 50%  HOSPICE ELIGIBILITY/DIAGNOSIS: TBD  Chief Complaint: Palliative Medicine follow up visit.   HISTORY OF PRESENT ILLNESS:  Katie [name] is a 67  y.o. year old female  with ESRD on hemodialysis, hypertension, T2DM, anemia, hypertensive heart and kidney disease, chronic diastolic HF, depression.  She is upbeat, reports doing well overall, pain is well-managed with current pain regimen; no nausea, no diarrhea, in no acute distress.  She denies pain/discomfort  History obtained from review of EMR, discussion with primary team, and interview with family, facility staff/caregiver and/or Katie [name]. A 10-Point ROS is negative, except for the pertinent positives/negatives detailed in the HPI.  I reviewed available labs, medications, imaging, studies and related documents from the EMR.   I spent 60 minutes providing this consultation; this includes time spent with patient/family, chart review and documentation. More than 50% of the time in this consultation was spent on counseling and coordinating communication   Thank you for the opportunity to participate in the care of Katie [name]. Please call our office at [phone] if we can be of additional assistance.   Note: Portions of this note were generated with Scientist, clinical (histocompatibility and immunogenetics). Dictation errors may occur despite best attempts at proofreading.   Rosaura Carpenter, NP

## 2022-11-07 ENCOUNTER — Ambulatory Visit (INDEPENDENT_AMBULATORY_CARE_PROVIDER_SITE_OTHER): Payer: Medicaid Other | Admitting: Nurse Practitioner

## 2022-11-07 ENCOUNTER — Encounter (INDEPENDENT_AMBULATORY_CARE_PROVIDER_SITE_OTHER): Payer: Medicaid Other

## 2022-11-20 ENCOUNTER — Ambulatory Visit: Payer: Medicaid Other | Admitting: Podiatry

## 2022-11-21 ENCOUNTER — Encounter (INDEPENDENT_AMBULATORY_CARE_PROVIDER_SITE_OTHER): Payer: Medicaid Other

## 2022-11-23 ENCOUNTER — Encounter: Payer: Self-pay | Admitting: Medical

## 2022-11-23 ENCOUNTER — Ambulatory Visit: Payer: Medicaid Other | Attending: Medical | Admitting: Medical

## 2022-11-23 NOTE — Progress Notes (Deleted)
Cardiology Office Note:    Date:  11/23/2022   ID:  Katie [name], DOB 20-Dec-1955, MRN [id number]  PCP:  [facility]  [facility] Cardiologist:  None  [facility] Electrophysiologist:  None   Referring MD: [employer], [facility]*   Chief Complaint: ***  History of Present Illness:    Katie [name] is a 67 y.o. female with a hx of ESRD on HD, hypertension, prior CVA who is being seen for follow-up.  The patient was seen in 2021 during admission for acute on chronic heart failure, pericardial effusion and hypertension.  Echo showed EF of 55 to 60%, severe LVH, grade 2 diastolic dysfunction, moderately reduced RV SF, moderate circumferential pericardial effusion, mild to moderate MR, mild TR, mild AI, mild to moderate AAS.  No signs of tamponade noted.  Patient was started on IV Lasix's and underwent right pleural thoracentesis.Creatinine continued to worsen.RHC showed mildly elevated left heart, right heart, and pulmonary artery pressures. Recommended continued diuresis. She was placed on temporary dialysis. Patient was eventually transitioned to hemodialysis.    The patient was admitted in February 2024 in the setting of missing 3 hemodialysis sessions and orthostasis.  Cardiology was asked to see moderate to severe aortic stenosis.  Echo showed a mean gradient of 28.8 mmHg, peak velocity 3.35 m/s, DI 0.31, AVA 0.8.  Cannot exclude low-flow low gradient severe AS.  Patient was started on midodrine.  Plan was for further outpatient workup.  Past Medical History:  Diagnosis Date   Hypertension    Renal disorder     Past Surgical History:  Procedure Laterality Date   A/V SHUNT INTERVENTION Left 12/21/2021   Procedure: A/V SHUNT INTERVENTION;  Surgeon: Annice Needy, MD;  Location: [facility];  Service: Cardiovascular;  Laterality: Left;   APPENDECTOMY     AV FISTULA PLACEMENT Left 03/16/2021   Procedure: INSERTION OF ARTERIOVENOUS (AV) GORE-TEX GRAFT ARM;   Surgeon: Annice Needy, MD;  Location: [facility];  Service: Vascular;  Laterality: Left;   DIALYSIS/PERMA CATHETER INSERTION N/A 05/16/2020   Procedure: DIALYSIS/PERMA CATHETER INSERTION;  Surgeon: Annice Needy, MD;  Location: [facility];  Service: Cardiovascular;  Laterality: N/A;   DIALYSIS/PERMA CATHETER REMOVAL N/A 03/26/2022   Procedure: DIALYSIS/PERMA CATHETER REMOVAL;  Surgeon: Annice Needy, MD;  Location: [facility];  Service: Cardiovascular;  Laterality: N/A;   DIALYSIS/PERMA CATHETER REPAIR N/A 01/22/2022   Procedure: DIALYSIS/PERMA CATHETER INSERT;  Surgeon: Annice Needy, MD;  Location: [facility];  Service: Cardiovascular;  Laterality: N/A;   MANDIBLE SURGERY     RENAL BIOPSY Right    RIGHT HEART CATH N/A 05/10/2020   Procedure: RIGHT HEART CATH;  Surgeon: Yvonne Kendall, MD;  Location: [facility];  Service: Cardiovascular;  Laterality: N/A;   TEMPORARY DIALYSIS CATHETER N/A 05/11/2020   Procedure: TEMPORARY DIALYSIS CATHETER;  Surgeon: Annice Needy, MD;  Location: [facility];  Service: Cardiovascular;  Laterality: N/A;    Current Medications: No outpatient medications have been marked as taking for the 11/23/22 encounter (Appointment) with Fransico Michael,  H, PA-C.     Allergies:   Penicillins   Social History   Socioeconomic History   Marital status: Single    Spouse name: Not on file   Number of children: Not on file   Years of education: Not on file   Highest education level: Not on file  Occupational History   Not on file  Tobacco Use  Smoking status: Former    Types: Cigarettes   Smokeless tobacco: Never  Vaping Use   Vaping status: Never Used  Substance and Sexual Activity   Alcohol use: Not Currently   Drug use: Never   Sexual activity: Not on file  Other Topics Concern   Not on file  Social History Narrative   Not on file   Social Determinants of Health   Financial Resource Strain: Not on file  Food  Insecurity: No Food Insecurity (06/14/2022)   Hunger Vital Sign    Worried About Running Out of Food in the Last Year: Never true    Ran Out of Food in the Last Year: Never true  Transportation Needs: No Transportation Needs (06/14/2022)   PRAPARE - Administrator, Civil Service (Medical): No    Lack of Transportation (Non-Medical): No  Physical Activity: Not on file  Stress: Not on file  Social Connections: Not on file     Family History: The patient's ***family history includes Asthma in her mother; CAD in her father; Heart attack in her mother; Lung cancer in her brother; Parkinson's disease in her father; Prostate cancer in her father; Skin cancer in her father; Uterine cancer in her mother.  ROS:   Please see the history of present illness.    *** All other systems reviewed and are negative.  EKGs/Labs/Other Studies Reviewed:    The following studies were reviewed today: ***  EKG:  EKG is *** ordered today.  The ekg ordered today demonstrates ***  Recent Labs: 06/12/2022: ALT 9 06/16/2022: Magnesium 1.8 06/18/2022: BUN 54; Creatinine, Ser 4.53; Hemoglobin 9.7; Platelets 174; Potassium 4.5; Sodium 136  Recent Lipid Panel    Component Value Date/Time   CHOL 137 05/03/2020 0408   TRIG 55 05/03/2020 0408   HDL 51 05/03/2020 0408   CHOLHDL 2.7 05/03/2020 0408   VLDL 11 05/03/2020 0408   LDLCALC 75 05/03/2020 0408     Risk Assessment/Calculations:   {Does this patient have ATRIAL FIBRILLATION?:[phone]}   Physical Exam:    VS:  There were no vitals taken for this visit.    Wt Readings from Last 3 Encounters:  06/16/22 148 lb 9.4 oz (67.4 kg)  03/26/22 147 lb (66.7 kg)  01/22/22 155 lb (70.3 kg)     GEN: *** Well nourished, well developed in no acute distress HEENT: Normal NECK: No JVD; No carotid bruits LYMPHATICS: No lymphadenopathy CARDIAC: ***RRR, no murmurs, rubs, gallops RESPIRATORY:  Clear to auscultation without rales, wheezing or rhonchi   ABDOMEN: Soft, non-tender, non-distended MUSCULOSKELETAL:  No edema; No deformity  SKIN: Warm and dry NEUROLOGIC:  Alert and oriented x 3 PSYCHIATRIC:  Normal affect   ASSESSMENT:    No diagnosis found. PLAN:    In order of problems listed above:  ***  Disposition: Follow up {follow up:15908} with ***   Shared Decision Making/Informed Consent   {Are you ordering a CV Procedure (e.g. stress test, cath, DCCV, TEE, etc)?   Press F2        :[id number]}    Signed,  Ardelle Lesches  11/23/2022 7:33 AM    [facility]

## 2022-11-30 ENCOUNTER — Ambulatory Visit (INDEPENDENT_AMBULATORY_CARE_PROVIDER_SITE_OTHER): Payer: Medicaid Other | Admitting: Nurse Practitioner

## 2023-02-07 ENCOUNTER — Ambulatory Visit: Payer: Medicaid Other | Admitting: Cardiology

## 2023-02-22 IMAGING — CR DG CHEST 2V
1 series · 2 of 2 positions shown · non-contrast
Comparison: 11/25/2020

CLINICAL DATA: Chest pain, renal failure

EXAM:
CHEST - 2 VIEW

[Series 1: dg chest 2 view · 0.14mm/px · 2 of 2 slices shown]
[im 1/2]
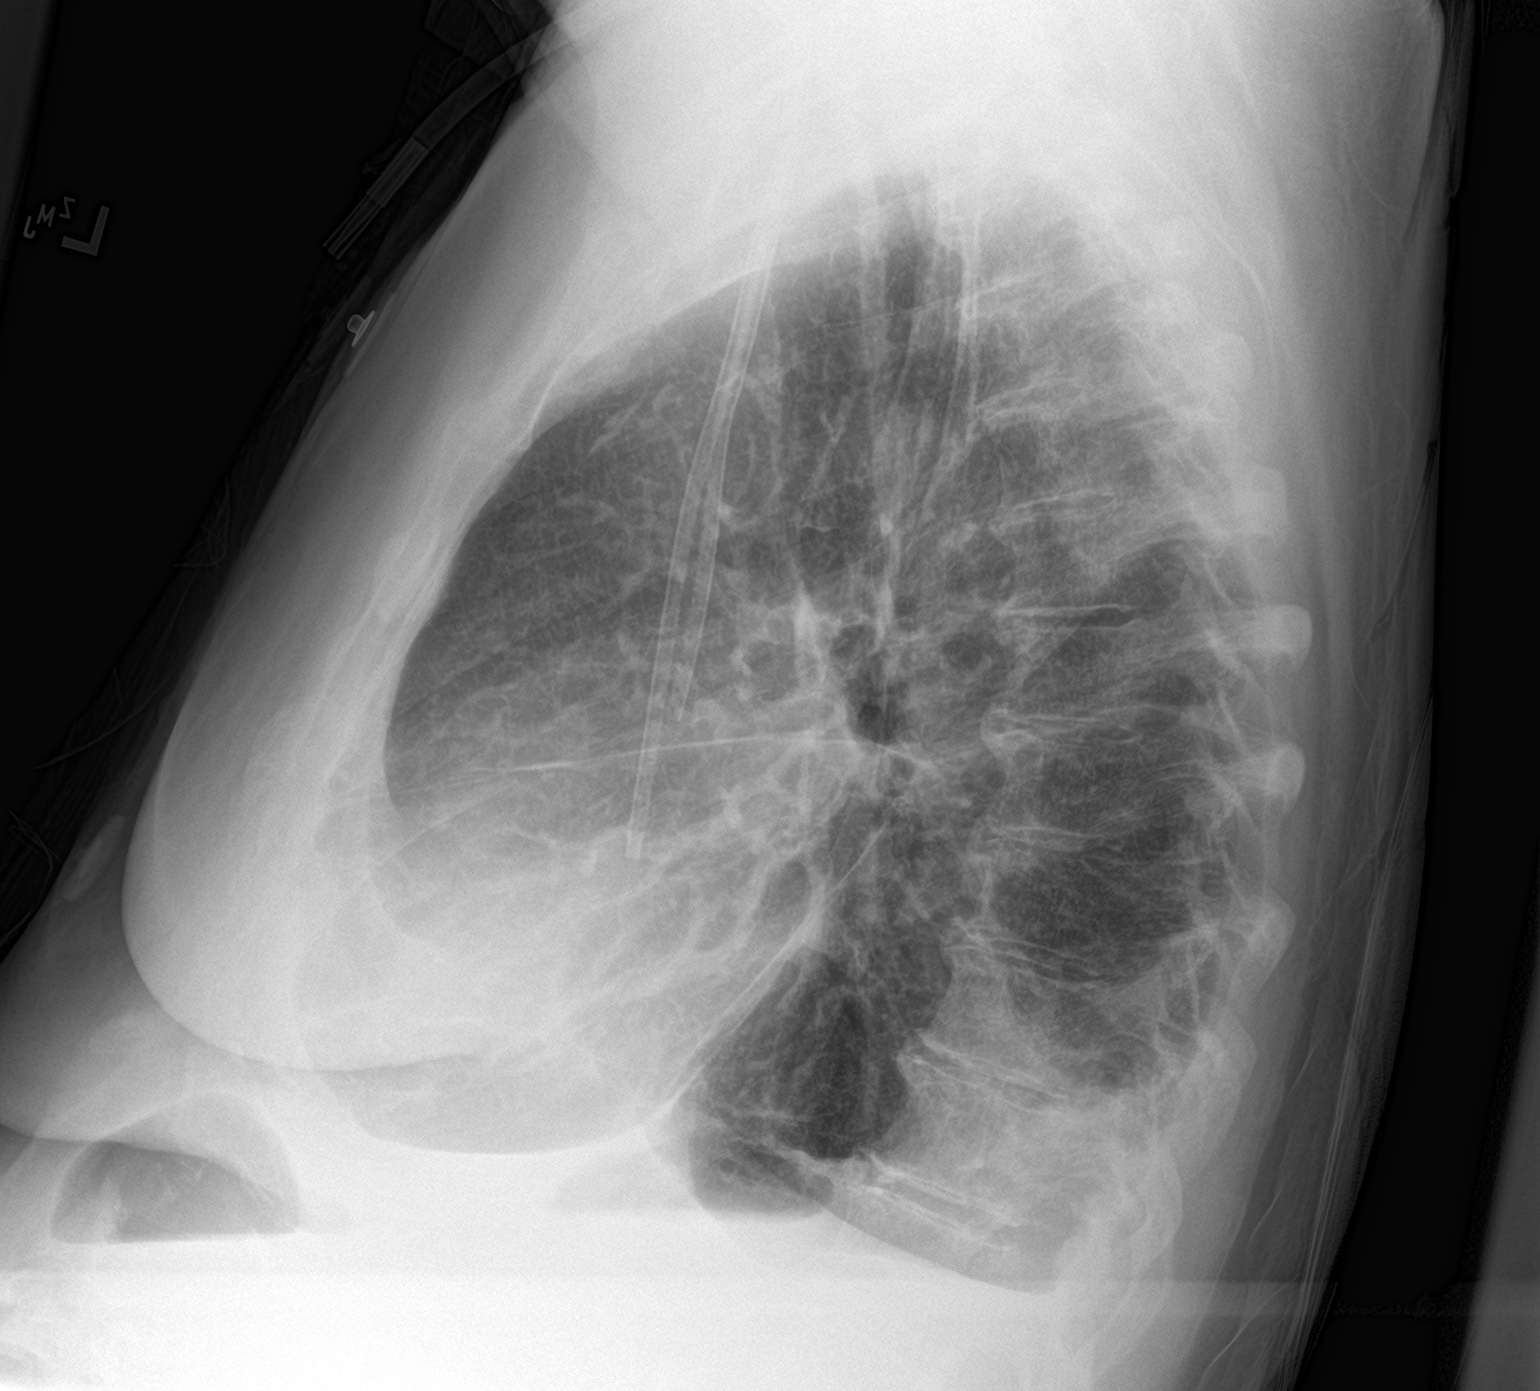
[im 2/2]
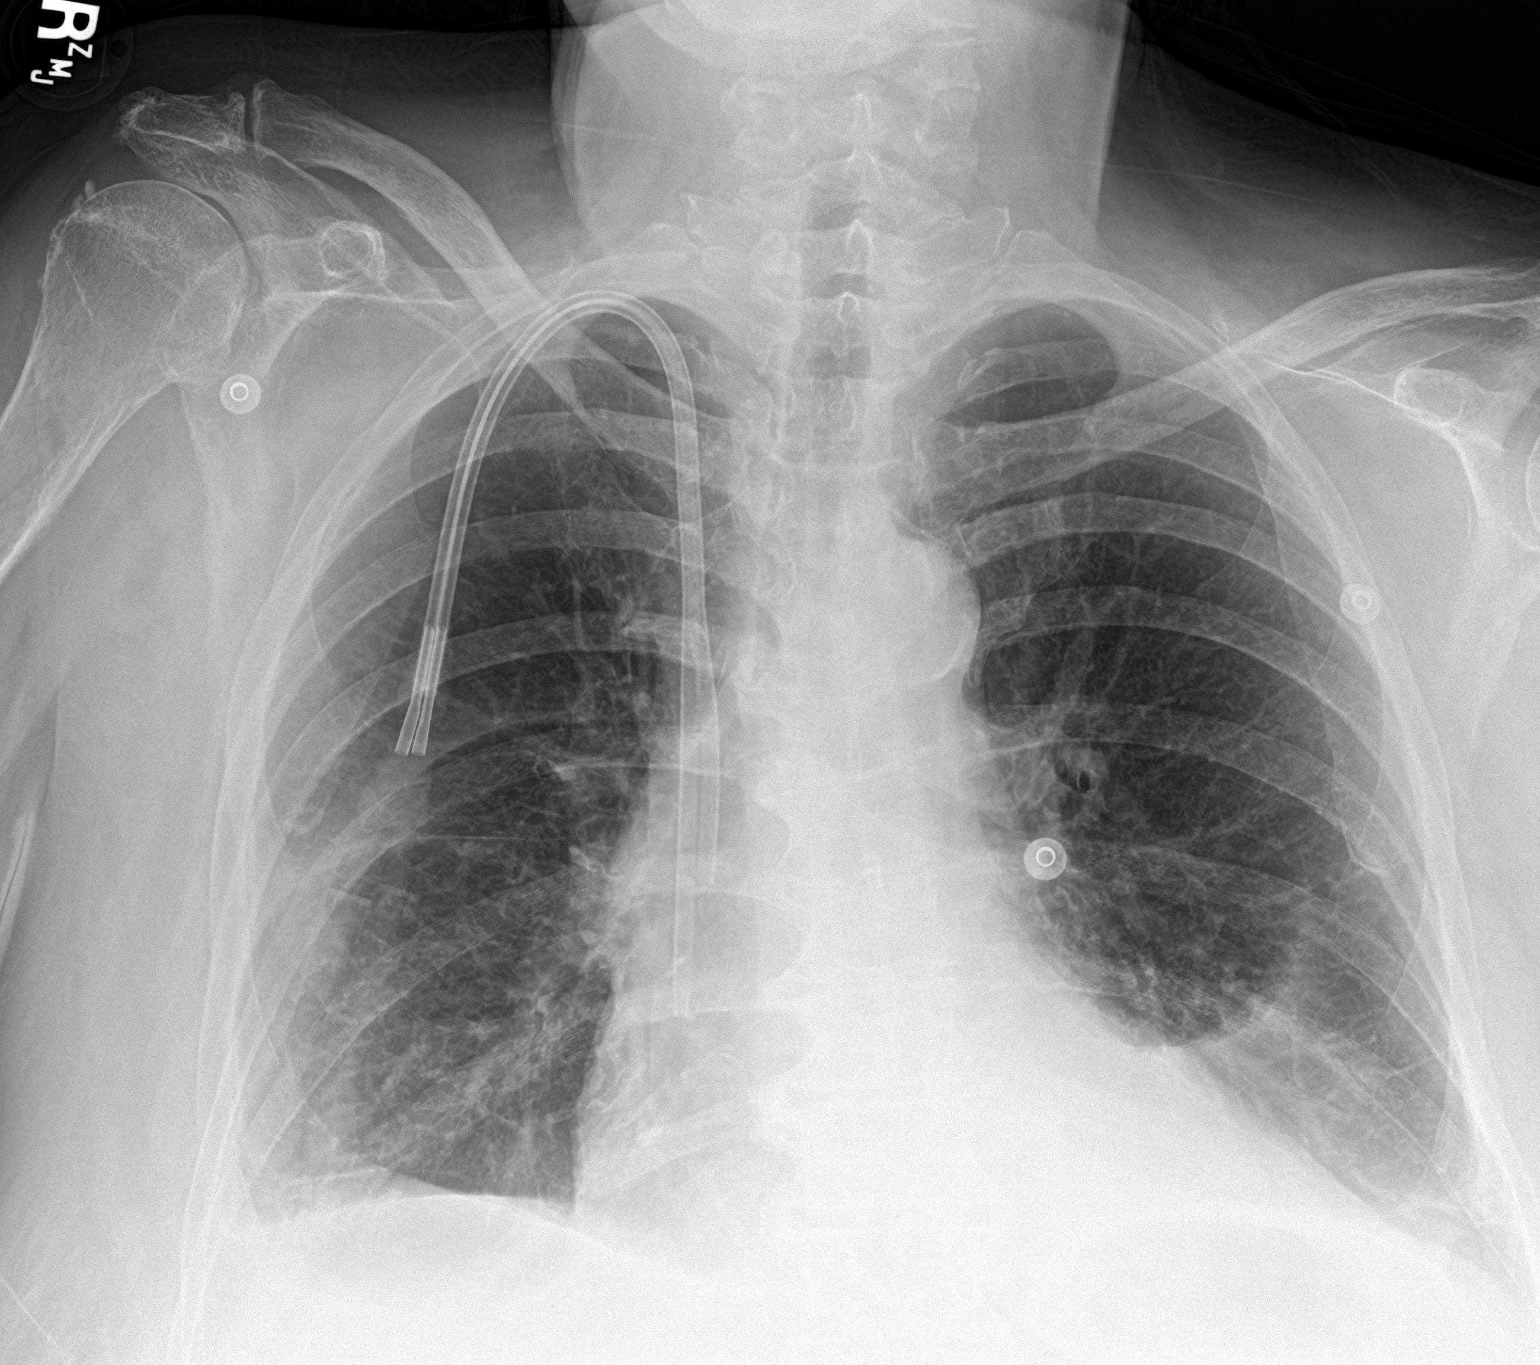

[2 of 2 positions shown; findings below may reference images not displayed]

FINDINGS: Cardiomegaly. Large-bore right neck multi lumen vascular catheter.
Small bilateral pleural effusions. Disc degenerative disease
thoracic spine.
IMPRESSION: Cardiomegaly. Small bilateral pleural effusions, unchanged compared
to prior.

## 2023-03-20 ENCOUNTER — Ambulatory Visit: Payer: Medicaid Other | Admitting: Cardiology

## 2023-06-03 ENCOUNTER — Encounter: Payer: Self-pay | Admitting: Cardiology

## 2023-06-03 ENCOUNTER — Ambulatory Visit: Payer: Medicare Other | Attending: Cardiology | Admitting: Cardiology

## 2023-06-03 VITALS — BP 108/56 | HR 72 | Ht 66.0 in | Wt 133.8 lb

## 2023-06-03 DIAGNOSIS — I350 Nonrheumatic aortic (valve) stenosis: Secondary | ICD-10-CM | POA: Insufficient documentation

## 2023-06-03 DIAGNOSIS — I5032 Chronic diastolic (congestive) heart failure: Secondary | ICD-10-CM | POA: Insufficient documentation

## 2023-06-03 DIAGNOSIS — I959 Hypotension, unspecified: Secondary | ICD-10-CM | POA: Insufficient documentation

## 2023-06-03 DIAGNOSIS — I2510 Atherosclerotic heart disease of native coronary artery without angina pectoris: Secondary | ICD-10-CM | POA: Diagnosis not present

## 2023-06-03 MED ORDER — ASPIRIN 81 MG PO TBEC: 81.0000 mg | DELAYED_RELEASE_TABLET | Freq: Every day | ORAL | Status: DC

## 2023-06-03 NOTE — Progress Notes (Signed)
Cardiology Office Note:    Date:  06/03/2023   ID:  Katie [name], DOB 07-Apr-1956, MRN [id number]  PCP:  Almetta Lovely, [facility]   [facility] Providers Cardiologist:  Debbe Odea, MD     Referring MD: Almetta Lovely, [facility]*   Chief Complaint  Patient presents with   New Patient (Initial Visit)    Patient is a resident at [facility] here  to establish cardiac care.  Does have cardiac history but patient is unsure of any information.      History of Present Illness:    Katie [name] is a 68 y.o. female with a hx of CAD (LAD, RCA, LCx calcifications on chest CT ), HFpEF, ESRD on HD TTS, former smoker x 30+ years, CVA presenting to establish care.  Last seen by our group in the hospital approximately 1 year ago for near syncope, nausea and vomiting.she had missed a couple of dialysis sessions.  Dialysis was attempted, patient almost passed out with hypotension systolics in the 60s.  Echocardiogram obtained 06/2022 showed normal systolic function EF 60 to 65%, grade 2 diastolic dysfunction, moderate to severe aortic valve stenosis, aortic valve mean gradient 31, V-max 3.7 m, AVA 0.8 cm.  Outpatient follow-up with cardiology was recommended.  Chest CT 10/2021 three-vessel coronary calcification, aortic valve calcification.  Denies chest pain but notes occasional dizziness with standing.  Takes midodrine on dialysis days.  Not sure of BP before or after dialysis sessions.  Past Medical History:  Diagnosis Date   Acute diastolic heart failure (HCC)    Chronic diastolic heart failure (HCC)    COPD (chronic obstructive pulmonary disease) (HCC)    Hypertension    Renal disorder     Past Surgical History:  Procedure Laterality Date   A/V SHUNT INTERVENTION Left 12/21/2021   Procedure: A/V SHUNT INTERVENTION;  Surgeon: Annice Needy, MD;  Location: [facility] INVASIVE CV LAB;  Service: Cardiovascular;  Laterality: Left;   APPENDECTOMY     AV FISTULA PLACEMENT Left  03/16/2021   Procedure: INSERTION OF ARTERIOVENOUS (AV) GORE-TEX GRAFT ARM;  Surgeon: Annice Needy, MD;  Location: [facility] ORS;  Service: Vascular;  Laterality: Left;   DIALYSIS/PERMA CATHETER INSERTION N/A 05/16/2020   Procedure: DIALYSIS/PERMA CATHETER INSERTION;  Surgeon: Annice Needy, MD;  Location: [facility] INVASIVE CV LAB;  Service: Cardiovascular;  Laterality: N/A;   DIALYSIS/PERMA CATHETER REMOVAL N/A 03/26/2022   Procedure: DIALYSIS/PERMA CATHETER REMOVAL;  Surgeon: Annice Needy, MD;  Location: [facility] INVASIVE CV LAB;  Service: Cardiovascular;  Laterality: N/A;   DIALYSIS/PERMA CATHETER REPAIR N/A 01/22/2022   Procedure: DIALYSIS/PERMA CATHETER INSERT;  Surgeon: Annice Needy, MD;  Location: [facility] INVASIVE CV LAB;  Service: Cardiovascular;  Laterality: N/A;   MANDIBLE SURGERY     RENAL BIOPSY Right    RIGHT HEART CATH N/A 05/10/2020   Procedure: RIGHT HEART CATH;  Surgeon: Yvonne Kendall, MD;  Location: [facility] INVASIVE CV LAB;  Service: Cardiovascular;  Laterality: N/A;   TEMPORARY DIALYSIS CATHETER N/A 05/11/2020   Procedure: TEMPORARY DIALYSIS CATHETER;  Surgeon: Annice Needy, MD;  Location: [facility] INVASIVE CV LAB;  Service: Cardiovascular;  Laterality: N/A;    Current Medications: Current Meds  Medication Sig   acetaminophen (TYLENOL) 500 MG tablet Take 500 mg by mouth every 6 (six) hours as needed for moderate pain.   ADVAIR DISKUS 250-50 MCG/ACT AEPB Inhale 1 puff into the lungs 2 (two) times daily.   albuterol (VENTOLIN HFA) 108 (90 Base) MCG/ACT inhaler Inhale 1 puff into  the lungs every 6 (six) hours as needed for wheezing.   aspirin EC 81 MG tablet Take 1 tablet (81 mg total) by mouth daily. Swallow whole.   DULoxetine (CYMBALTA) 20 MG capsule Take 20 mg by mouth daily.   famotidine (PEPCID) 20 MG tablet Take 20 mg by mouth daily as needed for heartburn or indigestion.   midodrine (PROAMATINE) 5 MG tablet Take 5 mg by mouth. Monday, Wednesday, Friday prior to dialysis   ondansetron  (ZOFRAN) 4 MG tablet Take 4 mg by mouth every 8 (eight) hours as needed.   oxyCODONE (OXY IR/ROXICODONE) 5 MG immediate release tablet Take 5 mg by mouth every 4 (four) hours as needed for severe pain.   pantoprazole (PROTONIX) 40 MG tablet Take 1 tablet (40 mg total) by mouth 2 (two) times daily.   saccharomyces boulardii (FLORASTOR) 250 MG capsule Take 250 mg by mouth 2 (two) times daily.   senna-docusate (SENOKOT-S) 8.6-50 MG tablet Take 1 tablet by mouth 2 (two) times daily as needed for mild constipation.   topiramate (TOPAMAX) 50 MG tablet Take 1 tablet (50 mg total) by mouth 2 (two) times daily as needed (headache).   traZODone (DESYREL) 50 MG tablet Take 1 tablet (50 mg total) by mouth every evening.     Allergies:   Penicillins   Social History   Socioeconomic History   Marital status: Single    Spouse name: Not on file   Number of children: Not on file   Years of education: Not on file   Highest education level: Not on file  Occupational History   Not on file  Tobacco Use   Smoking status: Former    Types: Cigarettes   Smokeless tobacco: Never   Tobacco comments:    Quit over 15 years ago  Vaping Use   Vaping status: Never Used  Substance and Sexual Activity   Alcohol use: Not Currently   Drug use: Never   Sexual activity: Not on file  Other Topics Concern   Not on file  Social History Narrative   Not on file   Social Drivers of Health   Financial Resource Strain: Not on file  Food Insecurity: No Food Insecurity (06/14/2022)   Hunger Vital Sign    Worried About Running Out of Food in the Last Year: Never true    Ran Out of Food in the Last Year: Never true  Transportation Needs: No Transportation Needs (06/14/2022)   PRAPARE - Administrator, Civil Service (Medical): No    Lack of Transportation (Non-Medical): No  Physical Activity: Not on file  Stress: Not on file  Social Connections: Not on file     Family History: The patient's family history  includes Asthma in her mother; CAD in her father; Heart attack in her mother; Lung cancer in her brother; Parkinson's disease in her father; Prostate cancer in her father; Skin cancer in her father; Uterine cancer in her mother.  ROS:   Please see the history of present illness.     All other systems reviewed and are negative.  EKGs/Labs/Other Studies Reviewed:    The following studies were reviewed today: EKG Interpretation Date/Time:  Monday June 03 2023 09:17:13 EST Ventricular Rate:  72 PR Interval:  194 QRS Duration:  78 QT Interval:  400 QTC Calculation: 438 R Axis:   15  Text Interpretation: Normal sinus rhythm Possible Left atrial enlargement Confirmed by Debbe Odea ([id number]) on 06/03/2023 9:25:42 AM    Recent  Labs: 06/12/2022: ALT 9 06/16/2022: Magnesium 1.8 06/18/2022: BUN 54; Creatinine, Ser 4.53; Hemoglobin 9.7; Platelets 174; Potassium 4.5; Sodium 136  Recent Lipid Panel    Component Value Date/Time   CHOL 137 05/03/2020 0408   TRIG 55 05/03/2020 0408   HDL 51 05/03/2020 0408   CHOLHDL 2.7 05/03/2020 0408   VLDL 11 05/03/2020 0408   LDLCALC 75 05/03/2020 0408     Risk Assessment/Calculations:             Physical Exam:    VS:  BP (!) 108/56 (BP Location: Left Arm, Patient Position: Sitting, Cuff Size: Normal)   Pulse 72   Ht 5\' 6"  (1.676 m)   Wt 133 lb 12.8 oz (60.7 kg)   SpO2 98%   BMI 21.60 kg/m     Wt Readings from Last 3 Encounters:  06/03/23 133 lb 12.8 oz (60.7 kg)  06/16/22 148 lb 9.4 oz (67.4 kg)  03/26/22 147 lb (66.7 kg)     GEN:  Well nourished, well developed in no acute distress HEENT: Normal NECK: No JVD; No carotid bruits CARDIAC: RRR, 3/6 systolic murmur RESPIRATORY: Diminished breath sounds bilaterally ABDOMEN: Soft, non-tender, non-distended MUSCULOSKELETAL:  No edema; No deformity  SKIN: Warm and dry NEUROLOGIC:  Alert and oriented x 3 PSYCHIATRIC:  Normal affect   ASSESSMENT:    1. Nonrheumatic aortic valve  stenosis   2. Chronic heart failure with preserved ejection fraction (HCC)   3. Coronary artery disease involving native coronary artery of native heart, unspecified whether angina present   4. Hypotension, unspecified hypotension type    PLAN:    In order of problems listed above:  Moderate to severe aortic valve stenosis on echo 06/2022.  Repeat echocardiogram. HFpEF, appears euvolemic volume control with hemodialysis. CAD, three-vessel coronary calcifications on chest CT.  Repeat echo as above.  Start aspirin 81 mg daily.  Cholesterol controlled. Hypotension, BP reasonable.  Continue midodrine 5 mg on days with dialysis.  Follow-up after echocardiogram      Medication Adjustments/Labs and Tests Ordered: Current medicines are reviewed at length with the patient today.  Concerns regarding medicines are outlined above.  Orders Placed This Encounter  Procedures   EKG 12-Lead   ECHOCARDIOGRAM COMPLETE   Meds ordered this encounter  Medications   aspirin EC 81 MG tablet    Sig: Take 1 tablet (81 mg total) by mouth daily. Swallow whole.    Patient Instructions  Medication Instructions:   START Aspirin - Take one tablet ( 81mg ) by mouth daily Midodrine - Take one tablet (5 mg) by mouth Monday, Wednesday, Friday 30 mins before dialysis   *If you need a refill on your cardiac medications before your next appointment, please call your pharmacy*   Lab Work:  None Ordered  If you have labs (blood work) drawn today and your tests are completely normal, you will receive your results only by: MyChart Message (if you have MyChart) OR A paper copy in the mail If you have any lab test that is abnormal or we need to change your treatment, we will call you to review the results.   Testing/Procedures:  Your physician has requested that you have an echocardiogram. Echocardiography is a painless test that uses sound waves to create images of your heart. It provides your doctor with  information about the size and shape of your heart and how well your heart's chambers and valves are working. This procedure takes approximately one hour. There are no restrictions for this  procedure. Please do NOT wear cologne, perfume, aftershave, or lotions (deodorant is allowed). Please arrive 15 minutes prior to your appointment time.  Please note: We ask at that you not bring children with you during ultrasound (echo/ vascular) testing. Due to room size and safety concerns, children are not allowed in the ultrasound rooms during exams. Our front office staff cannot provide observation of children in our lobby area while testing is being conducted. An adult accompanying a patient to their appointment will only be allowed in the ultrasound room at the discretion of the ultrasound technician under special circumstances. We apologize for any inconvenience.    Follow-Up: At [facility], you and your health needs are our priority.  As part of our continuing mission to provide you with exceptional heart care, we have created designated Provider Care Teams.  These Care Teams include your primary Cardiologist (physician) and Advanced Practice Providers (APPs -  Physician Assistants and Nurse Practitioners) who all work together to provide you with the care you need, when you need it.  We recommend signing up for the patient portal called "MyChart".  Sign up information is provided on this After Visit Summary.  MyChart is used to connect with patients for Virtual Visits (Telemedicine).  Patients are able to view lab/test results, encounter notes, upcoming appointments, etc.  Non-urgent messages can be sent to your provider as well.   To learn more about what you can do with MyChart, go to [email or web address].    Your next appointment:    After Echocardiogram  Provider:   You may see Debbe Odea, MD or one of the following Advanced Practice Providers on your designated Care Team:    Nicolasa Ducking, NP Eula Listen, PA-C Cadence Fransico Michael, PA-C Charlsie Quest, NP Carlos Levering, NP     Signed, Debbe Odea, MD  06/03/2023 10:24 AM    [facility]

## 2023-06-03 NOTE — Patient Instructions (Signed)
Medication Instructions:   START Aspirin - Take one tablet ( 81mg ) by mouth daily Midodrine - Take one tablet (5 mg) by mouth Monday, Wednesday, Friday 30 mins before dialysis   *If you need a refill on your cardiac medications before your next appointment, please call your pharmacy*   Lab Work:  None Ordered  If you have labs (blood work) drawn today and your tests are completely normal, you will receive your results only by: MyChart Message (if you have MyChart) OR A paper copy in the mail If you have any lab test that is abnormal or we need to change your treatment, we will call you to review the results.   Testing/Procedures:  Your physician has requested that you have an echocardiogram. Echocardiography is a painless test that uses sound waves to create images of your heart. It provides your doctor with information about the size and shape of your heart and how well your heart's chambers and valves are working. This procedure takes approximately one hour. There are no restrictions for this procedure. Please do NOT wear cologne, perfume, aftershave, or lotions (deodorant is allowed). Please arrive 15 minutes prior to your appointment time.  Please note: We ask at that you not bring children with you during ultrasound (echo/ vascular) testing. Due to room size and safety concerns, children are not allowed in the ultrasound rooms during exams. Our front office staff cannot provide observation of children in our lobby area while testing is being conducted. An adult accompanying a patient to their appointment will only be allowed in the ultrasound room at the discretion of the ultrasound technician under special circumstances. We apologize for any inconvenience.    Follow-Up: At [facility], you and your health needs are our priority.  As part of our continuing mission to provide you with exceptional heart care, we have created designated Provider Care Teams.  These Care Teams  include your primary Cardiologist (physician) and Advanced Practice Providers (APPs -  Physician Assistants and Nurse Practitioners) who all work together to provide you with the care you need, when you need it.  We recommend signing up for the patient portal called "MyChart".  Sign up information is provided on this After Visit Summary.  MyChart is used to connect with patients for Virtual Visits (Telemedicine).  Patients are able to view lab/test results, encounter notes, upcoming appointments, etc.  Non-urgent messages can be sent to your provider as well.   To learn more about what you can do with MyChart, go to [email or web address].    Your next appointment:    After Echocardiogram  Provider:   You may see Debbe Odea, MD or one of the following Advanced Practice Providers on your designated Care Team:   Nicolasa Ducking, NP Eula Listen, PA-C Cadence Fransico Michael, PA-C Charlsie Quest, NP Carlos Levering, NP

## 2023-06-20 ENCOUNTER — Ambulatory Visit: Payer: Medicare Other | Attending: Cardiology

## 2023-06-20 ENCOUNTER — Other Ambulatory Visit: Payer: Medicare Other

## 2023-06-20 DIAGNOSIS — I350 Nonrheumatic aortic (valve) stenosis: Secondary | ICD-10-CM | POA: Diagnosis present

## 2023-06-20 LAB — ECHOCARDIOGRAM COMPLETE
AR max vel: 0.83 cm2
AV Area VTI: 0.87 cm2
AV Area mean vel: 0.92 cm2
AV Mean grad: 36.5 mm[Hg]
AV Peak grad: 90.3 mm[Hg]
Ao pk vel: 4.75 m/s
Area-P 1/2: 3.17 cm2
MV VTI: 1.59 cm2
S' Lateral: 2.55 cm
Single Plane A4C EF: 73.2 %

## 2023-06-25 ENCOUNTER — Ambulatory Visit: Payer: Medicare Other | Admitting: Cardiology

## 2023-07-03 NOTE — Progress Notes (Deleted)
 Cardiology Office Note:  .   Date:  07/03/2023  ID:  Katie [name], DOB 07-10-1955, MRN [id number] PCP: Almetta Lovely, [facility]  [facility] Providers Cardiologist:  Debbe Odea, MD { Click to update primary MD,subspecialty MD or APP then REFRESH:1}   History of Present Illness: .   Katie [name] is a 68 y.o. female with a past medical history of end-stage renal disease on hemodialysis, hypertension, prior CVA, coronary artery disease (LAD, RCA, left circumflex calcifications on chest CT), HFpEF, former smoker (30+ years), aortic stenosis, who is here today for follow-up on her aortic stenosis.   She was previously evaluated in the hospital in February 2020 for which she presented to [facility] emergency department with dizziness, nausea, vomiting, diarrhea for about a week having missed 3 hemodialysis sessions due to her illness.  She was admitted to get dialysis and had a near syncopal episode when EMS brought her in for further evaluation.  On initial presentation she was found to be hypotensive with blood pressure of 99/57 and severely orthostatic with a blood pressure of 62/39 when standing.  She was admitted for further management.  She was treated with IV fluids and started on midodrine initially but that was stopped due to resting hypertension.  As part of her workup she ran which severe aortic stenosis and cardiology was consulted for further evaluation.  She had noted that she was always lightheaded with standing and at times this progressed to syncope.  She is not sure how often she completely would lose consciousness but she always has a prodrome of lightheadedness and wakes up instantly on hitting the ground.  She also notes that she had chronic shortness of breath.  Not on hemodialysis but continued to make some urine but did not have swelling.  She often complained of tightness in her chest when she walks but it feels more like she is unable to take a deep breath with the chest  pain.  Her walking is limited by balance and gait instability not her shortness of breath or chest pain.  Echocardiogram completed at hospitalization revealed an LVEF of 60 to 65% with severe concentric left ventricular hypertrophy, G3 DD, mildly dilated left and right atrium, mild mitral regurgitation, moderate to severe mitral annular calcification, mild aortic regurgitation, moderate to severe aortic valve stenosis.   She was last seen in clinic 06/03/2023 by Dr. Azucena Cecil.  At that time she denied any chest pain but noted occasional dizziness with standing.  She continued to be on midodrine on dialysis days.  She was scheduled for an updated echocardiogram.  It was also recommended that she start aspirin 81 mg daily.  She returns to clinic today  ROS: 10 point review of systems has been reviewed and considered negative with exception of what is been listed in the HPI  Studies Reviewed: .      2D echo 07-04-2023  1. Left ventricular ejection fraction, by estimation, is 60 to 65%. The  left ventricle has normal function. The left ventricle has no regional  wall motion abnormalities. There is moderate left ventricular hypertrophy.  Left ventricular diastolic  parameters are consistent with Grade I diastolic dysfunction (impaired  relaxation).   2. Right ventricular systolic function is normal. The right ventricular  size is normal. There is mildly elevated pulmonary artery systolic  pressure. The estimated right ventricular systolic pressure is 40.5 mmHg.   3. Left atrial size was mildly dilated.   4. The mitral valve is normal in structure. No  evidence of mitral valve  regurgitation. No evidence of mitral stenosis. The mean mitral valve  gradient is 4.0 mmHg. Moderate mitral annular calcification.   5. The aortic valve is calcified. There is severe calcifcation of the  aortic valve. Aortic valve regurgitation is mild. Severe aortic valve  stenosis. Aortic valve area, by VTI measures 0.87  cm. Aortic valve mean  gradient measures 36.5 mmHg. Aortic  valve Vmax measures 4.75 m/s.   6. There is borderline dilatation of the ascending aorta, measuring 39  mm.   7. The inferior vena cava is normal in size with greater than 50%  respiratory variability, suggesting right atrial pressure of 3 mmHg.   2D echo 06/16/2022 1. Left ventricular ejection fraction, by estimation, is 60 to 65%. The  left ventricle has normal function. The left ventricle has no regional  wall motion abnormalities. There is severe concentric left ventricular  hypertrophy. Left ventricular diastolic   parameters are consistent with Grade III diastolic dysfunction  (restrictive).   2. Right ventricular systolic function is normal. The right ventricular  size is normal.   3. Left atrial size was mildly dilated.   4. Right atrial size was mildly dilated.   5. The mitral valve is degenerative. Mild mitral valve regurgitation. No  evidence of mitral stenosis. Moderate to severe mitral annular  calcification.   6. The aortic valve is calcified. Aortic valve regurgitation is mild.  Moderate to severe aortic valve stenosis.   7. The inferior vena cava is normal in size with greater than 50%  respiratory variability, suggesting right atrial pressure of 3 mmHg.  Risk Assessment/Calculations:     No BP recorded.  {Refresh Note OR Click here to enter BP  :1}***       Physical Exam:   VS:  There were no vitals taken for this visit.   Wt Readings from Last 3 Encounters:  06/03/23 133 lb 12.8 oz (60.7 kg)  06/16/22 148 lb 9.4 oz (67.4 kg)  03/26/22 147 lb (66.7 kg)    GEN: Well nourished, well developed in no acute distress NECK: No JVD; No carotid bruits CARDIAC: ***RRR,III/VI harsh systolic murmurs, rubs, gallops RESPIRATORY:  Clear to auscultation without rales, wheezing or rhonchi  ABDOMEN: Soft, non-tender, non-distended EXTREMITIES:  No edema; No deformity   ASSESSMENT AND PLAN: .   Moderate to severe  aortic valve stenosis HFpEF Coronary artery disease Hypertension Hyperlipidemia    {Are you ordering a CV Procedure (e.g. stress test, cath, DCCV, TEE, etc)?   Press F2        :[id number]}  Dispo: ***  Signed, Jeda Pardue, NP

## 2023-07-04 ENCOUNTER — Ambulatory Visit: Payer: Medicare Other | Attending: Cardiology | Admitting: Cardiology

## 2023-08-06 ENCOUNTER — Ambulatory Visit: Attending: Medical | Admitting: Medical

## 2023-08-06 VITALS — BP 106/59 | HR 92 | Ht 66.0 in | Wt 128.0 lb

## 2023-08-06 DIAGNOSIS — I2510 Atherosclerotic heart disease of native coronary artery without angina pectoris: Secondary | ICD-10-CM | POA: Diagnosis not present

## 2023-08-06 DIAGNOSIS — N186 End stage renal disease: Secondary | ICD-10-CM | POA: Insufficient documentation

## 2023-08-06 DIAGNOSIS — I5032 Chronic diastolic (congestive) heart failure: Secondary | ICD-10-CM | POA: Insufficient documentation

## 2023-08-06 DIAGNOSIS — I350 Nonrheumatic aortic (valve) stenosis: Secondary | ICD-10-CM | POA: Diagnosis present

## 2023-08-06 NOTE — Patient Instructions (Signed)
 Medication Instructions:  Your physician recommends that you continue on your current medications as directed. Please refer to the Current Medication list given to you today.   *If you need a refill on your cardiac medications before your next appointment, please call your pharmacy*   Lab Work: Your provider would like for you to have following labs drawn today (CBC, BMP).     Testing/Procedures: Your physician has requested that you have a cardiac catheterization. Cardiac catheterization is used to diagnose and/or treat various heart conditions. Doctors may recommend this procedure for a number of different reasons. The most common reason is to evaluate chest pain. Chest pain can be a symptom of coronary artery disease (CAD), and cardiac catheterization can show whether plaque is narrowing or blocking your heart's arteries. This procedure is also used to evaluate the valves, as well as measure the blood flow and oxygen levels in different parts of your heart. For further information please visit [email or web address] Please follow instruction sheet, as given. Please see instructions below   Follow-Up: At [facility], you and your health needs are our priority.  As part of our continuing mission to provide you with exceptional heart care, we have created designated Provider Care Teams.  These Care Teams include your primary Cardiologist (physician) and Advanced Practice Providers (APPs -  Physician Assistants and Nurse Practitioners) who all work together to provide you with the care you need, when you need it.  We recommend signing up for the patient portal called "MyChart".  Sign up information is provided on this After Visit Summary.  MyChart is used to connect with patients for Virtual Visits (Telemedicine).  Patients are able to view lab/test results, encounter notes, upcoming appointments, etc.  Non-urgent messages can be sent to your provider as well.   To learn more about what you  can do with MyChart, go to [email or web address].    Your next appointment:   3 -4 week(s)  Provider:   Cadence Fransico Michael, PA-C     [facility] A DEPT OF . [facility] AT [facility] [address], [address] [place] [address] Dept: [phone] Loc: [phone]  Danyle Boening  08/06/2023  You are scheduled for a Cardiac Catheterization on Thursday, April 3 with Dr. Bryan Lemma.  1. Please arrive at the [facility] Entrance of [facility], [address], [place] [address] at 10:30 AM (This is 1 hour(s) prior to your procedure time).  Proceed to the Check-In Desk directly inside the entrance.  Procedure Parking: Use the entrance off of the [address] side of the hospital. Turn right upon entering and follow the driveway to parking that is directly in front of the Heart & Vascular Center. There is no valet parking available at this entrance, however there is an awning directly in front of the Heart & Vascular Center for drop off/ pick up for patients.  Special note: Every effort is made to have your procedure done on time. Please understand that emergencies sometimes delay scheduled procedures.  2. Diet: Do not eat solid foods after midnight.  The patient may have clear liquids until 5am upon the day of the procedure.  3. Labs: You will need to have blood drawn today (CBC, BMP)  4. Medication instructions in preparation for your procedure:   Contrast Allergy: No   On the morning of your procedure, take your Aspirin 81 mg and any morning medicines NOT listed above.  You may use  sips of water.  5. Plan to go home the same day, you will only stay overnight if medically necessary. 6. Bring a current list of your medications and current insurance cards. 7. You MUST have a responsible person to drive you home. 8. Someone MUST be with you the first 24 hours after you arrive home or your discharge will be  delayed. 9. Please wear clothes that are easy to get on and off and wear slip-on shoes.  Thank you for allowing [place] to care for you!   -- [facility]

## 2023-08-06 NOTE — Progress Notes (Unsigned)
 Cardiology Office Note:  .   Date:  08/08/2023  ID:  Aida Raider, DOB [birth year], MRN [id number] PCP: Almetta Lovely, [facility] Providers Cardiologist:  Debbe Odea, MD     History of Present Illness: .   Katie [name] is a 68 y.o. female with a hx of CAD (LAD, RCA, LCx calcifications on chest CT ), HFpEF, ESRD on HD MWF, former smoker x 30+ years, CVA presenting for echo follow-up  Echocardiogram obtained 06/2022 showed normal systolic function EF 60 to 65%, grade 2 diastolic dysfunction, moderate to severe aortic valve stenosis, aortic valve mean gradient 31, V-max 3.7 m, AVA 0.8 cm.  Outpatient follow-up with cardiology was recommended.   Chest CT 10/2021 three-vessel coronary calcification, aortic valve calcification.  The patient was last seen in January 2025 was overall stable from a cardiac perspective.  Repeat echo was ordered to evaluate aortic stenosis.  Echo showed EF 60 to 65%, no wall motion abnormality, moderate LVH, grade 1 diastolic dysfunction, severe AS.  Today, echo was reviewed. She quit smoking 15 years ago. She reports SOB from COPD. She has intermittent chest pain. No lower leg edema. She does dialysis but makes urine. Midodrine is on her med list, but she does not normally take midodrine on HD days. She is agreeable R/L heart cath to further evaluate aortic valve.   Studies Reviewed: Marland Kitchen   EKG Interpretation Date/Time:  Tuesday August 06 2023 15:12:43 EDT Ventricular Rate:  92 PR Interval:  180 QRS Duration:  84 QT Interval:  376 QTC Calculation: 464 R Axis:   13  Text Interpretation: Normal sinus rhythm Minimal voltage criteria for LVH, may be normal variant ( Sokolow-Lyon ) Nonspecific ST and T wave abnormality When compared with ECG of 03-Jun-2023 09:17, No significant change was found Confirmed by Fransico Michael, Artemis Koller ([id number]) on 08/06/2023 3:18:21 PM    Echo 06/2023 1. Left ventricular ejection fraction, by estimation, is 60 to 65%. The   left ventricle has normal function. The left ventricle has no regional  wall motion abnormalities. There is moderate left ventricular hypertrophy.  Left ventricular diastolic  parameters are consistent with Grade I diastolic dysfunction (impaired  relaxation).   2. Right ventricular systolic function is normal. The right ventricular  size is normal. There is mildly elevated pulmonary artery systolic  pressure. The estimated right ventricular systolic pressure is 40.5 mmHg.   3. Left atrial size was mildly dilated.   4. The mitral valve is normal in structure. No evidence of mitral valve  regurgitation. No evidence of mitral stenosis. The mean mitral valve  gradient is 4.0 mmHg. Moderate mitral annular calcification.   5. The aortic valve is calcified. There is severe calcifcation of the  aortic valve. Aortic valve regurgitation is mild. Severe aortic valve  stenosis. Aortic valve area, by VTI measures 0.87 cm. Aortic valve mean  gradient measures 36.5 mmHg. Aortic  valve Vmax measures 4.75 m/s.   6. There is borderline dilatation of the ascending aorta, measuring 39  mm.   7. The inferior vena cava is normal in size with greater than 50%  respiratory variability, suggesting right atrial pressure of 3 mmHg.   Comparison(s): Previous AV meas, peak PG, 29 mmHg mean PG.   Echo 06/2022  1. Left ventricular ejection fraction, by estimation, is 60 to 65%. The  left ventricle has normal function. The left ventricle has no regional  wall motion abnormalities. There is severe concentric left ventricular  hypertrophy. Left ventricular diastolic  parameters are consistent with Grade III diastolic dysfunction  (restrictive).   2. Right ventricular systolic function is normal. The right ventricular  size is normal.   3. Left atrial size was mildly dilated.   4. Right atrial size was mildly dilated.   5. The mitral valve is degenerative. Mild mitral valve regurgitation. No  evidence of  mitral stenosis. Moderate to severe mitral annular  calcification.   6. The aortic valve is calcified. Aortic valve regurgitation is mild.  Moderate to severe aortic valve stenosis.   7. The inferior vena cava is normal in size with greater than 50%  respiratory variability, suggesting right atrial pressure of 3 mmHg.   Conclusion(s)/Recommendation(s): Valvular findings as outlined below   RHC 04/2020 Conclusions: Mildly elevated left heart, right heart, and pulmonary artery pressures. Normal to supranormal Fick cardiac output/index.   Recommendations: Continue gentle diuresis. Consider further workup of potential causes of high-output heart failure.   Yvonne Kendall, MD [facility]     Physical Exam:   VS:  BP (!) 106/59   Pulse 92   Ht 5\' 6"  (1.676 m)   Wt 128 lb (58.1 kg)   SpO2 98%   BMI 20.66 kg/m    Wt Readings from Last 3 Encounters:  08/06/23 128 lb (58.1 kg)  06/03/23 133 lb 12.8 oz (60.7 kg)  06/16/22 148 lb 9.4 oz (67.4 kg)    GEN: Well nourished, well developed in no acute distress NECK: No JVD; No carotid bruits CARDIAC: RRR, + murmur, no rubs, gallops RESPIRATORY:  Clear to auscultation without rales, wheezing or rhonchi  ABDOMEN: Soft, non-tender, non-distended EXTREMITIES:  No edema; No deformity   ASSESSMENT AND PLAN: .    Severe Aortic stenosis Recent echo showed LVEF 60-65%, severe aortic stenosis, VTI 0.87cm2, aortic valve mean gradient 36.5, aortic valve Vmax 4.75 m/s. Patient denies significant symptoms. Plan for R/L heart cath next week. She does HF M,W,F. She does make urine. I will schedule her for next Thursday and contact nephrology to see if this is [facility], or she needs to be admitted post-cath for dialysis. Further recommendations pending heart cath.  Addendum: per nephrology, would recommend admission after cardiac cath for dialysis and other comorbidities.  HFpEF Patient is euvolemic. Volume management per HD.   CAD Prior chest CT  showed 3V coronary calcifications. She reports intermittent chest pain. Plan for R/L heart cath as above.   ESRD She does HD M,W,F. Will contact nephrologist to discuss cardiac cath day. She has midodrine to take HD days, but says she does not take it.      Informed Consent   Shared Decision Making/Informed Consent The risks [stroke (1 in 1000), death (1 in 1000), kidney failure [usually temporary] (1 in 500), bleeding (1 in 200), allergic reaction [possibly serious] (1 in 200)], benefits (diagnostic support and management of coronary artery disease) and alternatives of a cardiac catheterization were discussed in detail with Ms. Lint and she is willing to proceed.     Dispo: Follow-up in 3 weeks  Signed, Namiyah Grantham David Stall, PA-C

## 2023-08-06 NOTE — H&P (View-Only) (Signed)
 Cardiology Office Note:  .   Date:  08/08/2023  ID:  Aida Raider, DOB 04-30-1956, MRN [id number] PCP: Almetta Lovely, [facility]  [facility] Providers Cardiologist:  Debbe Odea, MD     History of Present Illness: .   Katie [name] is a 68 y.o. female with a hx of CAD (LAD, RCA, LCx calcifications on chest CT ), HFpEF, ESRD on HD MWF, former smoker x 30+ years, CVA presenting for echo follow-up  Echocardiogram obtained 06/2022 showed normal systolic function EF 60 to 65%, grade 2 diastolic dysfunction, moderate to severe aortic valve stenosis, aortic valve mean gradient 31, V-max 3.7 m, AVA 0.8 cm.  Outpatient follow-up with cardiology was recommended.   Chest CT 10/2021 three-vessel coronary calcification, aortic valve calcification.  The patient was last seen in January 2025 was overall stable from a cardiac perspective.  Repeat echo was ordered to evaluate aortic stenosis.  Echo showed EF 60 to 65%, no wall motion abnormality, moderate LVH, grade 1 diastolic dysfunction, severe AS.  Today, echo was reviewed. She quit smoking 15 years ago. She reports SOB from COPD. She has intermittent chest pain. No lower leg edema. She does dialysis but makes urine. Midodrine is on her med list, but she does not normally take midodrine on HD days. She is agreeable R/L heart cath to further evaluate aortic valve.   Studies Reviewed: Marland Kitchen   EKG Interpretation Date/Time:  Tuesday August 06 2023 15:12:43 EDT Ventricular Rate:  92 PR Interval:  180 QRS Duration:  84 QT Interval:  376 QTC Calculation: 464 R Axis:   13  Text Interpretation: Normal sinus rhythm Minimal voltage criteria for LVH, may be normal variant ( Sokolow-Lyon ) Nonspecific ST and T wave abnormality When compared with ECG of 03-Jun-2023 09:17, No significant change was found Confirmed by Fransico Michael, Daune Divirgilio ([id number]) on 08/06/2023 3:18:21 PM    Echo 06/2023 1. Left ventricular ejection fraction, by estimation, is 60 to 65%. The   left ventricle has normal function. The left ventricle has no regional  wall motion abnormalities. There is moderate left ventricular hypertrophy.  Left ventricular diastolic  parameters are consistent with Grade I diastolic dysfunction (impaired  relaxation).   2. Right ventricular systolic function is normal. The right ventricular  size is normal. There is mildly elevated pulmonary artery systolic  pressure. The estimated right ventricular systolic pressure is 40.5 mmHg.   3. Left atrial size was mildly dilated.   4. The mitral valve is normal in structure. No evidence of mitral valve  regurgitation. No evidence of mitral stenosis. The mean mitral valve  gradient is 4.0 mmHg. Moderate mitral annular calcification.   5. The aortic valve is calcified. There is severe calcifcation of the  aortic valve. Aortic valve regurgitation is mild. Severe aortic valve  stenosis. Aortic valve area, by VTI measures 0.87 cm. Aortic valve mean  gradient measures 36.5 mmHg. Aortic  valve Vmax measures 4.75 m/s.   6. There is borderline dilatation of the ascending aorta, measuring 39  mm.   7. The inferior vena cava is normal in size with greater than 50%  respiratory variability, suggesting right atrial pressure of 3 mmHg.   Comparison(s): Previous AV meas, peak PG, 29 mmHg mean PG.   Echo 06/2022  1. Left ventricular ejection fraction, by estimation, is 60 to 65%. The  left ventricle has normal function. The left ventricle has no regional  wall motion abnormalities. There is severe concentric left ventricular  hypertrophy. Left ventricular diastolic  parameters are consistent with Grade III diastolic dysfunction  (restrictive).   2. Right ventricular systolic function is normal. The right ventricular  size is normal.   3. Left atrial size was mildly dilated.   4. Right atrial size was mildly dilated.   5. The mitral valve is degenerative. Mild mitral valve regurgitation. No  evidence of  mitral stenosis. Moderate to severe mitral annular  calcification.   6. The aortic valve is calcified. Aortic valve regurgitation is mild.  Moderate to severe aortic valve stenosis.   7. The inferior vena cava is normal in size with greater than 50%  respiratory variability, suggesting right atrial pressure of 3 mmHg.   Conclusion(s)/Recommendation(s): Valvular findings as outlined below   RHC 04/2020 Conclusions: Mildly elevated left heart, right heart, and pulmonary artery pressures. Normal to supranormal Fick cardiac output/index.   Recommendations: Continue gentle diuresis. Consider further workup of potential causes of high-output heart failure.   Yvonne Kendall, MD [facility]     Physical Exam:   VS:  BP (!) 106/59   Pulse 92   Ht 5\' 6"  (1.676 m)   Wt 128 lb (58.1 kg)   SpO2 98%   BMI 20.66 kg/m    Wt Readings from Last 3 Encounters:  08/06/23 128 lb (58.1 kg)  06/03/23 133 lb 12.8 oz (60.7 kg)  06/16/22 148 lb 9.4 oz (67.4 kg)    GEN: Well nourished, well developed in no acute distress NECK: No JVD; No carotid bruits CARDIAC: RRR, + murmur, no rubs, gallops RESPIRATORY:  Clear to auscultation without rales, wheezing or rhonchi  ABDOMEN: Soft, non-tender, non-distended EXTREMITIES:  No edema; No deformity   ASSESSMENT AND PLAN: .    Severe Aortic stenosis Recent echo showed LVEF 60-65%, severe aortic stenosis, VTI 0.87cm2, aortic valve mean gradient 36.5, aortic valve Vmax 4.75 m/s. Patient denies significant symptoms. Plan for R/L heart cath next week. She does HF M,W,F. She does make urine. I will schedule her for next Thursday and contact nephrology to see if this is [facility], or she needs to be admitted post-cath for dialysis. Further recommendations pending heart cath.  Addendum: per nephrology, would recommend admission after cardiac cath for dialysis and other comorbidities.  HFpEF Patient is euvolemic. Volume management per HD.   CAD Prior chest CT  showed 3V coronary calcifications. She reports intermittent chest pain. Plan for R/L heart cath as above.   ESRD She does HD M,W,F. Will contact nephrologist to discuss cardiac cath day. She has midodrine to take HD days, but says she does not take it.      Informed Consent   Shared Decision Making/Informed Consent The risks [stroke (1 in 1000), death (1 in 1000), kidney failure [usually temporary] (1 in 500), bleeding (1 in 200), allergic reaction [possibly serious] (1 in 200)], benefits (diagnostic support and management of coronary artery disease) and alternatives of a cardiac catheterization were discussed in detail with Ms. Gorelik and she is willing to proceed.     Dispo: Follow-up in 3 weeks  Signed, Harly Pipkins David Stall, PA-C

## 2023-08-07 LAB — BASIC METABOLIC PANEL
BUN/Creatinine Ratio: 18 (ref 12–28)
BUN: 60 mg/dL — ABNORMAL HIGH (ref 8–27)
CO2: 28 mmol/L (ref 20–29)
Calcium: 9 mg/dL (ref 8.7–10.3)
Chloride: 96 mmol/L (ref 96–106)
Creatinine, Ser: 3.34 mg/dL — ABNORMAL HIGH (ref 0.57–1.00)
Glucose: 112 mg/dL — ABNORMAL HIGH (ref 70–99)
Potassium: 4.4 mmol/L (ref 3.5–5.2)
Sodium: 139 mmol/L (ref 134–144)
eGFR: 15 mL/min/{1.73_m2} — ABNORMAL LOW (ref >59)

## 2023-08-07 LAB — CBC
Hematocrit: 35.1 % (ref 34.0–46.6)
Hemoglobin: 11.7 g/dL (ref 11.1–15.9)
MCH: 32.1 pg (ref 26.6–33.0)
MCHC: 33.3 g/dL (ref 31.5–35.7)
MCV: 96 fL (ref 79–97)
Platelets: 173 10*3/uL (ref 150–450)
RBC: 3.64 x10E6/uL — ABNORMAL LOW (ref 3.77–5.28)
RDW: 11.8 % (ref 11.7–15.4)
WBC: 5.6 10*3/uL (ref 3.4–10.8)

## 2023-08-15 ENCOUNTER — Observation Stay

## 2023-08-15 ENCOUNTER — Observation Stay
Admission: RE | Admit: 2023-08-15 | Discharge: 2023-08-16 | Disposition: A | Discharge status: Home / Self Care | Attending: Internal Medicine | Admitting: Internal Medicine

## 2023-08-15 ENCOUNTER — Encounter
Admission: RE | Disposition: A | Discharge status: Home / Self Care | Payer: Self-pay | Source: Home / Self Care | Attending: Internal Medicine

## 2023-08-15 ENCOUNTER — Encounter: Payer: Self-pay | Admitting: Cardiology

## 2023-08-15 ENCOUNTER — Other Ambulatory Visit: Payer: Self-pay

## 2023-08-15 DIAGNOSIS — Z992 Dependence on renal dialysis: Secondary | ICD-10-CM | POA: Insufficient documentation

## 2023-08-15 DIAGNOSIS — N186 End stage renal disease: Secondary | ICD-10-CM

## 2023-08-15 DIAGNOSIS — Z87891 Personal history of nicotine dependence: Secondary | ICD-10-CM | POA: Insufficient documentation

## 2023-08-15 DIAGNOSIS — I5031 Acute diastolic (congestive) heart failure: Secondary | ICD-10-CM | POA: Diagnosis not present

## 2023-08-15 DIAGNOSIS — I132 Hypertensive heart and chronic kidney disease with heart failure and with stage 5 chronic kidney disease, or end stage renal disease: Secondary | ICD-10-CM | POA: Diagnosis not present

## 2023-08-15 DIAGNOSIS — R55 Syncope and collapse: Secondary | ICD-10-CM | POA: Insufficient documentation

## 2023-08-15 DIAGNOSIS — I509 Heart failure, unspecified: Principal | ICD-10-CM

## 2023-08-15 DIAGNOSIS — I5032 Chronic diastolic (congestive) heart failure: Secondary | ICD-10-CM | POA: Diagnosis present

## 2023-08-15 DIAGNOSIS — I350 Nonrheumatic aortic (valve) stenosis: Secondary | ICD-10-CM

## 2023-08-15 DIAGNOSIS — Z8673 Personal history of transient ischemic attack (TIA), and cerebral infarction without residual deficits: Secondary | ICD-10-CM | POA: Diagnosis not present

## 2023-08-15 DIAGNOSIS — I2510 Atherosclerotic heart disease of native coronary artery without angina pectoris: Secondary | ICD-10-CM | POA: Diagnosis not present

## 2023-08-15 DIAGNOSIS — J449 Chronic obstructive pulmonary disease, unspecified: Secondary | ICD-10-CM | POA: Diagnosis not present

## 2023-08-15 HISTORY — PX: RIGHT/LEFT HEART CATH AND CORONARY ANGIOGRAPHY: CATH118266

## 2023-08-15 LAB — POCT I-STAT EG7
Acid-Base Excess: 5 mmol/L — ABNORMAL HIGH (ref 0.0–2.0)
Acid-Base Excess: 5 mmol/L — ABNORMAL HIGH (ref 0.0–2.0)
Bicarbonate: 30.1 mmol/L — ABNORMAL HIGH (ref 20.0–28.0)
Bicarbonate: 30.6 mmol/L — ABNORMAL HIGH (ref 20.0–28.0)
Calcium, Ion: 1.12 mmol/L — ABNORMAL LOW (ref 1.15–1.40)
Calcium, Ion: 1.13 mmol/L — ABNORMAL LOW (ref 1.15–1.40)
HCT: 32 % — ABNORMAL LOW (ref 36.0–46.0)
HCT: 33 % — ABNORMAL LOW (ref 36.0–46.0)
Hemoglobin: 10.9 g/dL — ABNORMAL LOW (ref 12.0–15.0)
Hemoglobin: 11.2 g/dL — ABNORMAL LOW (ref 12.0–15.0)
O2 Saturation: 69 %
O2 Saturation: 72 %
Potassium: 4.1 mmol/L (ref 3.5–5.1)
Potassium: 4.1 mmol/L (ref 3.5–5.1)
Sodium: 135 mmol/L (ref 135–145)
Sodium: 136 mmol/L (ref 135–145)
TCO2: 32 mmol/L (ref 22–32)
TCO2: 32 mmol/L (ref 22–32)
pCO2, Ven: 48.2 mmHg (ref 44–60)
pCO2, Ven: 49.7 mmHg (ref 44–60)
pH, Ven: 7.397 (ref 7.25–7.43)
pH, Ven: 7.403 (ref 7.25–7.43)
pO2, Ven: 37 mmHg (ref 32–45)
pO2, Ven: 38 mmHg (ref 32–45)

## 2023-08-15 LAB — CBC
HCT: 32.3 % — ABNORMAL LOW (ref 36.0–46.0)
Hemoglobin: 10.5 g/dL — ABNORMAL LOW (ref 12.0–15.0)
MCH: 33 pg (ref 26.0–34.0)
MCHC: 32.5 g/dL (ref 30.0–36.0)
MCV: 101.6 fL — ABNORMAL HIGH (ref 80.0–100.0)
Platelets: 143 10*3/uL — ABNORMAL LOW (ref 150–400)
RBC: 3.18 MIL/uL — ABNORMAL LOW (ref 3.87–5.11)
RDW: 13.2 % (ref 11.5–15.5)
WBC: 5.8 10*3/uL (ref 4.0–10.5)
nRBC: 0 % (ref 0.0–0.2)

## 2023-08-15 LAB — POCT I-STAT 7, (LYTES, BLD GAS, ICA,H+H)
Acid-Base Excess: 5 mmol/L — ABNORMAL HIGH (ref 0.0–2.0)
Bicarbonate: 29.7 mmol/L — ABNORMAL HIGH (ref 20.0–28.0)
Calcium, Ion: 1.12 mmol/L — ABNORMAL LOW (ref 1.15–1.40)
HCT: 33 % — ABNORMAL LOW (ref 36.0–46.0)
Hemoglobin: 11.2 g/dL — ABNORMAL LOW (ref 12.0–15.0)
O2 Saturation: 93 %
Potassium: 4.1 mmol/L (ref 3.5–5.1)
Sodium: 138 mmol/L (ref 135–145)
TCO2: 31 mmol/L (ref 22–32)
pCO2 arterial: 45.1 mmHg (ref 32–48)
pH, Arterial: 7.427 (ref 7.35–7.45)
pO2, Arterial: 65 mmHg — ABNORMAL LOW (ref 83–108)

## 2023-08-15 LAB — CREATININE, SERUM
Creatinine, Ser: 3.17 mg/dL — ABNORMAL HIGH (ref 0.44–1.00)
GFR, Estimated: 16 mL/min — ABNORMAL LOW (ref >60)

## 2023-08-15 LAB — HIV ANTIBODY (ROUTINE TESTING W REFLEX): HIV Screen 4th Generation wRfx: NONREACTIVE

## 2023-08-15 SURGERY — RIGHT/LEFT HEART CATH AND CORONARY ANGIOGRAPHY
Anesthesia: Moderate Sedation | Laterality: Bilateral

## 2023-08-15 MED ORDER — ALBUTEROL SULFATE (2.5 MG/3ML) 0.083% IN NEBU: 3.0000 mL | INHALATION_SOLUTION | Freq: Four times a day (QID) | RESPIRATORY_TRACT | Status: DC | PRN

## 2023-08-15 MED ORDER — VERAPAMIL HCL 2.5 MG/ML IV SOLN
INTRAVENOUS | Status: DC | PRN
  Administered 2023-08-15: 2.5 mg via INTRAVENOUS

## 2023-08-15 MED ORDER — OXYCODONE HCL 5 MG PO TABS
5.0000 mg | ORAL_TABLET | Freq: Four times a day (QID) | ORAL | Status: DC
  Administered 2023-08-15 – 2023-08-16 (×4): 5 mg via ORAL
  Filled 2023-08-15 (×4): qty 1

## 2023-08-15 MED ORDER — HEPARIN (PORCINE) IN NACL 1000-0.9 UT/500ML-% IV SOLN
INTRAVENOUS | Status: AC
  Filled 2023-08-15: qty 1000

## 2023-08-15 MED ORDER — ASPIRIN 81 MG PO TBEC
81.0000 mg | DELAYED_RELEASE_TABLET | Freq: Every day | ORAL | Status: DC
  Administered 2023-08-16: 81 mg via ORAL
  Filled 2023-08-15: qty 1

## 2023-08-15 MED ORDER — DULOXETINE HCL 20 MG PO CPEP
20.0000 mg | ORAL_CAPSULE | Freq: Every day | ORAL | Status: DC
  Administered 2023-08-16: 20 mg via ORAL
  Filled 2023-08-15: qty 1

## 2023-08-15 MED ORDER — MELATONIN 5 MG PO TABS
5.0000 mg | ORAL_TABLET | Freq: Every day | ORAL | Status: DC
  Administered 2023-08-15: 5 mg via ORAL
  Filled 2023-08-15: qty 1

## 2023-08-15 MED ORDER — FAMOTIDINE 20 MG PO TABS: 20.0000 mg | ORAL_TABLET | Freq: Every day | ORAL | Status: DC

## 2023-08-15 MED ORDER — CHLORHEXIDINE GLUCONATE CLOTH 2 % EX PADS
6.0000 | MEDICATED_PAD | Freq: Every day | CUTANEOUS | Status: DC
  Administered 2023-08-16: 6 via TOPICAL

## 2023-08-15 MED ORDER — HEPARIN SODIUM (PORCINE) 5000 UNIT/ML IJ SOLN
5000.0000 [IU] | Freq: Three times a day (TID) | INTRAMUSCULAR | Status: DC
  Administered 2023-08-15 – 2023-08-16 (×2): 5000 [IU] via SUBCUTANEOUS
  Filled 2023-08-15 (×3): qty 1

## 2023-08-15 MED ORDER — SENNOSIDES-DOCUSATE SODIUM 8.6-50 MG PO TABS
1.0000 | ORAL_TABLET | Freq: Two times a day (BID) | ORAL | Status: DC | PRN
  Administered 2023-08-16: 1 via ORAL
  Filled 2023-08-15: qty 1

## 2023-08-15 MED ORDER — ASPIRIN 81 MG PO CHEW: 81.0000 mg | CHEWABLE_TABLET | ORAL | Status: DC

## 2023-08-15 MED ORDER — MIDAZOLAM HCL 2 MG/2ML IJ SOLN
INTRAMUSCULAR | Status: AC
  Filled 2023-08-15: qty 2

## 2023-08-15 MED ORDER — FENTANYL CITRATE (PF) 100 MCG/2ML IJ SOLN
INTRAMUSCULAR | Status: DC | PRN
  Administered 2023-08-15: 25 ug via INTRAVENOUS

## 2023-08-15 MED ORDER — SODIUM CHLORIDE 0.9% FLUSH
3.0000 mL | Freq: Two times a day (BID) | INTRAVENOUS | Status: DC
  Administered 2023-08-15: 3 mL via INTRAVENOUS

## 2023-08-15 MED ORDER — GUAIFENESIN 100 MG/5ML PO LIQD: 200.0000 mg | ORAL | Status: DC | PRN

## 2023-08-15 MED ORDER — IOHEXOL 300 MG/ML  SOLN
INTRAMUSCULAR | Status: DC | PRN
  Administered 2023-08-15: 82 mL

## 2023-08-15 MED ORDER — ACETAMINOPHEN 500 MG PO TABS
500.0000 mg | ORAL_TABLET | Freq: Three times a day (TID) | ORAL | Status: DC
  Administered 2023-08-15 – 2023-08-16 (×3): 500 mg via ORAL
  Filled 2023-08-15 (×3): qty 1

## 2023-08-15 MED ORDER — MOMETASONE FURO-FORMOTEROL FUM 200-5 MCG/ACT IN AERO
2.0000 | INHALATION_SPRAY | Freq: Two times a day (BID) | RESPIRATORY_TRACT | Status: DC
  Administered 2023-08-15 – 2023-08-16 (×2): 2 via RESPIRATORY_TRACT
  Filled 2023-08-15: qty 8.8

## 2023-08-15 MED ORDER — HEPARIN SODIUM (PORCINE) 1000 UNIT/ML IJ SOLN
INTRAMUSCULAR | Status: DC | PRN
  Administered 2023-08-15: 3000 [IU] via INTRAVENOUS

## 2023-08-15 MED ORDER — HYDRALAZINE HCL 20 MG/ML IJ SOLN: 10.0000 mg | INTRAMUSCULAR | Status: AC | PRN

## 2023-08-15 MED ORDER — PANTOPRAZOLE SODIUM 40 MG PO TBEC
40.0000 mg | DELAYED_RELEASE_TABLET | Freq: Every day | ORAL | Status: DC
  Administered 2023-08-16: 40 mg via ORAL
  Filled 2023-08-15: qty 1

## 2023-08-15 MED ORDER — SODIUM CHLORIDE 0.9 % IV SOLN: INTRAVENOUS | Status: DC

## 2023-08-15 MED ORDER — SENNA 8.6 MG PO TABS
1.0000 | ORAL_TABLET | Freq: Every day | ORAL | Status: DC
  Administered 2023-08-15: 8.6 mg via ORAL
  Filled 2023-08-15: qty 1

## 2023-08-15 MED ORDER — LIDOCAINE HCL (PF) 1 % IJ SOLN
INTRAMUSCULAR | Status: DC | PRN
  Administered 2023-08-15: 5 mL

## 2023-08-15 MED ORDER — MIDAZOLAM HCL 2 MG/2ML IJ SOLN
INTRAMUSCULAR | Status: DC | PRN
  Administered 2023-08-15: .5 mg via INTRAVENOUS

## 2023-08-15 MED ORDER — SACCHAROMYCES BOULARDII 250 MG PO CAPS
250.0000 mg | ORAL_CAPSULE | Freq: Every day | ORAL | Status: DC
  Administered 2023-08-16: 250 mg via ORAL
  Filled 2023-08-15: qty 1

## 2023-08-15 MED ORDER — VERAPAMIL HCL 2.5 MG/ML IV SOLN
INTRAVENOUS | Status: AC
Start: 2023-08-15 — End: ?
  Filled 2023-08-15: qty 2

## 2023-08-15 MED ORDER — ONDANSETRON HCL 4 MG/2ML IJ SOLN: 4.0000 mg | Freq: Four times a day (QID) | INTRAMUSCULAR | Status: DC | PRN

## 2023-08-15 MED ORDER — TOPIRAMATE 25 MG PO TABS: 50.0000 mg | ORAL_TABLET | Freq: Two times a day (BID) | ORAL | Status: DC | PRN

## 2023-08-15 MED ORDER — HEPARIN (PORCINE) IN NACL 2000-0.9 UNIT/L-% IV SOLN
INTRAVENOUS | Status: DC | PRN
  Administered 2023-08-15: 1000 mL

## 2023-08-15 MED ORDER — ONDANSETRON HCL 4 MG PO TABS
4.0000 mg | ORAL_TABLET | ORAL | Status: DC
Start: 2023-08-16 — End: 2023-08-16
  Administered 2023-08-16: 4 mg via ORAL
  Filled 2023-08-15: qty 1

## 2023-08-15 MED ORDER — SODIUM CHLORIDE 0.9 % IV SOLN: 250.0000 mL | INTRAVENOUS | Status: AC | PRN

## 2023-08-15 MED ORDER — FENTANYL CITRATE (PF) 100 MCG/2ML IJ SOLN
INTRAMUSCULAR | Status: AC
  Filled 2023-08-15: qty 2

## 2023-08-15 MED ORDER — TRAZODONE HCL 50 MG PO TABS
50.0000 mg | ORAL_TABLET | Freq: Every day | ORAL | Status: DC
  Administered 2023-08-15: 50 mg via ORAL
  Filled 2023-08-15: qty 1

## 2023-08-15 MED ORDER — HEPARIN SODIUM (PORCINE) 1000 UNIT/ML IJ SOLN
INTRAMUSCULAR | Status: AC
  Filled 2023-08-15: qty 10

## 2023-08-15 MED ORDER — SODIUM CHLORIDE 0.9% FLUSH: 3.0000 mL | INTRAVENOUS | Status: DC | PRN

## 2023-08-15 MED ORDER — LABETALOL HCL 5 MG/ML IV SOLN: 10.0000 mg | INTRAVENOUS | Status: AC | PRN

## 2023-08-15 SURGICAL SUPPLY — 13 items
CATH BALLN WEDGE 5F 110CM (CATHETERS) IMPLANT
CATH INFINITI AMBI 5FR TG (CATHETERS) IMPLANT
CATH INFINITI JR4 5F (CATHETERS) IMPLANT
DEVICE RAD TR BAND REGULAR (VASCULAR PRODUCTS) IMPLANT
DRAPE BRACHIAL (DRAPES) IMPLANT
GLIDESHEATH SLEND SS 6F .021 (SHEATH) IMPLANT
GUIDEWIRE INQWIRE 1.5J.035X260 (WIRE) IMPLANT
INQWIRE 1.5J .035X260CM (WIRE) ×1 IMPLANT
PACK CARDIAC CATH (CUSTOM PROCEDURE TRAY) ×1 IMPLANT
PROTECTION STATION PRESSURIZED (MISCELLANEOUS) ×1 IMPLANT
SET ATX-X65L (MISCELLANEOUS) IMPLANT
SHEATH GLIDE SLENDER 4/5FR (SHEATH) IMPLANT
STATION PROTECTION PRESSURIZED (MISCELLANEOUS) IMPLANT

## 2023-08-15 NOTE — Brief Op Note (Signed)
 BRIEF RIGHT & LEFT CARDIAC CATHETERIZATION NOTE    08/15/2023 1:14 PM ID:  Katie [name], DOB 02-13-1956, MRN [id number]   PCP: Almetta Lovely, [facility]  [facility] Providers Cardiologist:  Debbe Odea, MD   SURGEON:  Surgeons and Role:    * Marykay Lex, MD - Primary  PROCEDURE:  Procedure(s): RIGHT/LEFT HEART CATH AND CORONARY ANGIOGRAPHY (Bilateral)   PATIENT:  Katie [name]  67 y.o. female former (30 years) with a history of three-vessel coronary calcification noted on CT scan, HFpEF, ESRD on HD M-W-F with prior CVA with recently diagnosed severe aortic stenosis who is referred for right and left heart catheterization as part of preoperative evaluation for likely TAVR.  She has noted combination of exertional dyspnea with intermittent chest pains.  Has had issues with hypotension during dialysis.  She was seen by Terrilee Croak, PA PA on 08/08/2023 to follow-up after echocardiogram and scheduled procedure.  PRE-OPERATIVE DIAGNOSIS:  R and L Cath   Aortic stenosis PT TO BE ADMITTED PER NEPHROLOGIST, DR LATEEF  POST-OPERATIVE DIAGNOSIS:   Severe heavily calcified aortic stenosis with mean gradient by cardiac catheterization of 40 to 50 mmHg (peak to peak gradient 59 mmHg) Single-vessel disease with a 60% LAD stenosis just after tandem D1 and D2 (ostium of D1-D2 a roughly 90 and 80%); large codominant LCx with relatively no significant disease; moderate caliber codominant RCA with minimal disease in the PDA. RHC hemodynamics: RAP 1 mmHg, RV P-EDP 44/1-2 mmHg; PAP-mean 44/12-24 mmHg, PCWP mean 5 mmHg.;  Fick Cardiac Output-Index 6.41-3.87 with ao sat 93% and PA sat 70%. LV P-EDP 191/1-23 mmHg; AO P-MAP 140/61-95 mmHg Aortic AoV gradient P-P59 0.5 mmHg; mean AoV gradient estimated 45 to 50 mmHg   PROCEDURE PERFORMED: Time Out: Verified patient identification, verified procedure, site/side was marked, verified correct patient position, special equipment/implants  available, medications/allergies/relevent history reviewed, required imaging and test results available. Performed.  Access:  RIGHT radial Artery: 6 Fr sheath -- Seldinger technique using glide sheath micropuncture kit  Direct ultrasound guidance used.  Permanent image obtained and placed on chart. 10 mL radial cocktail IA; 3000 units IV Heparin * Right Brachial Vein: 5 French sheath-modified Seldinger technique-glide sheath micropuncture kit  Direct ultrasound guidance used.  Permanent image obtained and placed on chart.  Right Heart Catheterization: 5 Fr Theone Murdoch catheter advanced under fluoroscopy with balloon inflated to the RA, RV, then PCWP-PA for hemodynamic measurement. *Simultaneous FA & PA blood gases checked for SaO2% to calculate FICK CO/CI. Catheter removed completely out of the body with balloon deflated.  Left Heart Catheterization: 5Fr catheters advanced or exchanged over a J-wire under direct fluoroscopic guidance into the ascending aorta; TIG 4.0 catheter advanced first.  * LV Hemodynamics (no LV Gram) & Left Coronary Artery Cineangiography: TIG 4.0 catheter; the valve was crossed using the standard J-wire with minimal difficulty, however gradient was significant. * Right Coronary Artery Cineangiography: JR4 catheter   Upon completion of Angiogaphy, the catheter was removed completely out of the body over a wire, without complication.  Brachial Sheath(s) removed in the Cath Lab with manual pressure for hemostasis.    Radial sheath removed in the Cardiac Catheterization lab with TR Band placed for hemostasis.  TR Band: 1255 Hours; 12 mL air reverse Barbeau B  MEDICATIONS * SQ Lidocaine 3mL * Radial Cocktail: 3 mg Verapmil in 10 mL NS * Isovue Contrast: 50 mL * Heparin: 3000 units  ANESTHESIA:   local and IV sedation; 3 mL lidocaine for both  brachial and arterial access; 0.5 mg IV Versed, 25 mcg IV fentanyl Sublimaze for sedation  EBL: <20 mL  PATIENT DISPOSITION:   PACU - hemodynamically stable.  COMPLICATIONS: None  DICTATION: .Note written in EPIC  PLAN OF CARE:  Plan per discussion with primary cardiology team and nephrologist would be to have the patient be admitted under the hospitalist service.  She will need to have dialysis done while she is here and have titration of medications.    Delay start of Pharmacological VTE agent (>24hrs) due to surgical blood loss or risk of bleeding: not applicable   Bryan Lemma, MD

## 2023-08-15 NOTE — Progress Notes (Signed)
 Portable chest XRAY done at bedside at 15:32

## 2023-08-15 NOTE — Interval H&P Note (Signed)
 History and Physical Interval Note:  08/15/2023 12:05 PM  Katie [name]  has presented today for surgery, with the diagnosis of R and L Cath   Aortic stenosis PT TO BE ADMITTED PER NEPHROLOGIST, DR LATEEF.  The various methods of treatment have been discussed with the patient and family. After consideration of risks, benefits and other options for treatment, the patient has consented to  Procedure(s): RIGHT/LEFT HEART CATH AND CORONARY ANGIOGRAPHY (Bilateral)  PERCUTANEOUS CORONARY INTERVENTION   as a surgical intervention.  The patient's history has been reviewed, patient examined, no change in status, stable for surgery.  I have reviewed the patient's chart and labs.  Questions were answered to the patient's satisfaction.    Cath Lab Visit (complete for each Cath Lab visit)  Clinical Evaluation Leading to the Procedure:   ACS: No.  Non-ACS:    Anginal Classification: CCS II  Anti-ischemic medical therapy: Minimal Therapy (1 class of medications)  Non-Invasive Test Results: No non-invasive testing performed  Prior CABG: No previous CABG   Bryan Lemma

## 2023-08-15 NOTE — Progress Notes (Signed)
 [facility] assisted living, spoke to [employer] and [place]. Informed them that patient will not need transport today, as she is going to be admitted to [facility].

## 2023-08-15 NOTE — H&P (Addendum)
 History and Physical    Katie [name] BJY:[id number] DOB: 05-24-1955 DOA: 08/15/2023  PCP: [facility] (Confirm with patient/family/NH records and if not entered, this has to be entered at [facility] point of entry) Patient coming from: Home  I have personally briefly reviewed patient's old medical records in [facility]  Chief Complaint: Feeling fine  HPI: Katie [name] is a 68 y.o. female with medical history significant of ESRD on HD MWF, HD associated hypotension, HTN, COPD, chronic HFpEF, moderate AS presented today for an elective LHC to evaluate AS status, she has no complaints.  Patient was hospitalized 1 month ago for near syncope episode, and she reported that she continued to have episodes of feeling lightheadedness, denied any fall or loss of consciousness.  She denied any chest pain, SOB. Her last HD was yesterday of 3 1/2 hours without any incident.  ED Course: SBP 100/59, non hypoxic. VBG 7.42/45/65, BMP pending. EKG is pending.  Review of Systems: As per HPI otherwise 14 point review of systems negative.    Past Medical History:  Diagnosis Date   Acute diastolic heart failure (HCC)    Chronic diastolic heart failure (HCC)    COPD (chronic obstructive pulmonary disease) (HCC)    Hypertension    Renal disorder     Past Surgical History:  Procedure Laterality Date   A/V SHUNT INTERVENTION Left 12/21/2021   Procedure: A/V SHUNT INTERVENTION;  Surgeon: Annice Needy, MD;  Location: [facility] INVASIVE CV LAB;  Service: Cardiovascular;  Laterality: Left;   APPENDECTOMY     AV FISTULA PLACEMENT Left 03/16/2021   Procedure: INSERTION OF ARTERIOVENOUS (AV) GORE-TEX GRAFT ARM;  Surgeon: Annice Needy, MD;  Location: [facility] ORS;  Service: Vascular;  Laterality: Left;   DIALYSIS/PERMA CATHETER INSERTION N/A 05/16/2020   Procedure: DIALYSIS/PERMA CATHETER INSERTION;  Surgeon: Annice Needy, MD;  Location: [facility] INVASIVE CV LAB;  Service: Cardiovascular;  Laterality: N/A;    DIALYSIS/PERMA CATHETER REMOVAL N/A 03/26/2022   Procedure: DIALYSIS/PERMA CATHETER REMOVAL;  Surgeon: Annice Needy, MD;  Location: [facility] INVASIVE CV LAB;  Service: Cardiovascular;  Laterality: N/A;   DIALYSIS/PERMA CATHETER REPAIR N/A 01/22/2022   Procedure: DIALYSIS/PERMA CATHETER INSERT;  Surgeon: Annice Needy, MD;  Location: [facility] INVASIVE CV LAB;  Service: Cardiovascular;  Laterality: N/A;   MANDIBLE SURGERY     RENAL BIOPSY Right    RIGHT HEART CATH N/A 05/10/2020   Procedure: RIGHT HEART CATH;  Surgeon: Yvonne Kendall, MD;  Location: [facility] INVASIVE CV LAB;  Service: Cardiovascular;  Laterality: N/A;   TEMPORARY DIALYSIS CATHETER N/A 05/11/2020   Procedure: TEMPORARY DIALYSIS CATHETER;  Surgeon: Annice Needy, MD;  Location: [facility] INVASIVE CV LAB;  Service: Cardiovascular;  Laterality: N/A;     reports that she has quit smoking. Her smoking use included cigarettes. She has never used smokeless tobacco. She reports that she does not currently use alcohol. She reports that she does not use drugs.  Allergies  Allergen Reactions   Penicillins Nausea And Vomiting    Pt states "only pills" make her vomit Other reaction(s): Nausea/Vomit    Family History  Problem Relation Age of Onset   Heart attack Mother    Asthma Mother    Uterine cancer Mother    Skin cancer Father    Prostate cancer Father    CAD Father    Parkinson's disease Father    Lung cancer Brother      Prior to Admission medications   Medication Sig Start Date End  Date Taking? Authorizing Provider  acetaminophen (TYLENOL) 500 MG tablet Take 500 mg by mouth 3 (three) times daily. Take additional 500 mg every 6 hours as needed for pain   Yes [provider]  ADVAIR DISKUS 250-50 MCG/ACT AEPB Inhale 1 puff into the lungs 2 (two) times daily. 05/21/23  Yes [provider]  albuterol (VENTOLIN HFA) 108 (90 Base) MCG/ACT inhaler Inhale 1 puff into the lungs every 6 (six) hours as needed for wheezing. 01/29/22   Yes [provider]  aspirin EC 81 MG tablet Take 1 tablet (81 mg total) by mouth daily. Swallow whole. 06/03/23  Yes Agbor-Etang, Arlys John, MD  calcium carbonate (TUMS - DOSED IN MG ELEMENTAL CALCIUM) 500 MG chewable tablet Chew 1 tablet by mouth 3 (three) times daily as needed (GRED).   Yes [provider]  DULoxetine (CYMBALTA) 20 MG capsule Take 20 mg by mouth daily. 05/06/23  Yes [provider]  famotidine (PEPCID) 20 MG tablet Take 20 mg by mouth at bedtime.   Yes [provider]  guaifenesin (ROBITUSSIN) 100 MG/5ML syrup Take 200 mg by mouth every 4 (four) hours as needed for cough.   Yes [provider]  melatonin 3 MG TABS tablet Take 3 mg by mouth at bedtime.   Yes [provider]  midodrine (PROAMATINE) 5 MG tablet Take 5 mg by mouth. Monday, Wednesday, Friday prior to dialysis 05/06/23  Yes [provider]  ondansetron (ZOFRAN) 4 MG tablet Take 4 mg by mouth every Monday, Wednesday, and Friday. 04/27/23  Yes [provider]  oxyCODONE (OXY IR/ROXICODONE) 5 MG immediate release tablet Take 5 mg by mouth every 6 (six) hours.   Yes [provider]  pantoprazole (PROTONIX) 40 MG tablet Take 1 tablet (40 mg total) by mouth 2 (two) times daily. Patient taking differently: Take 40 mg by mouth daily. 11/22/21 08/15/23 Yes Dorcas Carrow, MD  Polyethylene Glycol 3350 POWD Take 1 Capful by mouth daily. 17 g   Yes [provider]  pramoxine (SARNA SENSITIVE) 1 % LOTN Apply 1 Application topically 2 (two) times daily.   Yes [provider]  Psyllium (REGULOID PO) Take 0.4 g/1.13m2 by mouth 2 (two) times daily.   Yes [provider]  saccharomyces boulardii (FLORASTOR) 250 MG capsule Take 250 mg by mouth daily.   Yes [provider]  senna (SENOKOT) 8.6 MG TABS tablet Take 1 tablet by mouth at bedtime.   Yes [provider]  senna-docusate (SENOKOT-S) 8.6-50 MG tablet Take 1  tablet by mouth 2 (two) times daily as needed for mild constipation. Patient taking differently: Take 1 tablet by mouth 2 (two) times daily. 12/25/21  Yes Esaw Grandchild A, DO  topiramate (TOPAMAX) 50 MG tablet Take 1 tablet (50 mg total) by mouth 2 (two) times daily as needed (headache). Patient taking differently: Take 50 mg by mouth 2 (two) times daily. 03/21/21  Yes Sreenath, Sudheer B, MD  traZODone (DESYREL) 50 MG tablet Take 1 tablet (50 mg total) by mouth every evening. Patient taking differently: Take 50 mg by mouth at bedtime. 07/02/20 08/15/23 Yes Lynn Ito, MD    Physical Exam: Vitals:   08/15/23 1255 08/15/23 1300 08/15/23 1305 08/15/23 1315  BP: (!) 113/49 (!) 104/48 (!) 92/36 (!) 100/59  Pulse: 87 84 86 84  Resp: (!) 33 (!) 23 19 (!) 23  Temp:      TempSrc:      SpO2: 97% 98%  96%  Weight:  Height:        Constitutional: NAD, calm, comfortable Vitals:   08/15/23 1255 08/15/23 1300 08/15/23 1305 08/15/23 1315  BP: (!) 113/49 (!) 104/48 (!) 92/36 (!) 100/59  Pulse: 87 84 86 84  Resp: (!) 33 (!) 23 19 (!) 23  Temp:      TempSrc:      SpO2: 97% 98%  96%  Weight:      Height:       Eyes: PERRL, lids and conjunctivae normal ENMT: Mucous membranes are moist. Posterior pharynx clear of any exudate or lesions.Normal dentition.  Neck: normal, supple, no masses, no thyromegaly Respiratory: clear to auscultation bilaterally, no wheezing, no crackles. Normal respiratory effort. No accessory muscle use.  Cardiovascular: Regular rate and rhythm, systolic murmur on heart base. No extremity edema. 2+ pedal pulses. No carotid bruits.  Abdomen: no tenderness, no masses palpated. No hepatosplenomegaly. Bowel sounds positive.  Musculoskeletal: no clubbing / cyanosis. No joint deformity upper and lower extremities. Good ROM, no contractures. Normal muscle tone.  Skin: no rashes, lesions, ulcers. No induration Neurologic: CN 2-12 grossly intact. Sensation intact, DTR normal.  Strength 5/5 in all 4.  Psychiatric: Normal judgment and insight. Alert and oriented x 3. Normal mood.     Labs on Admission: I have personally reviewed following labs and imaging studies  CBC: Recent Labs  Lab 08/15/23 1228 08/15/23 1231 08/15/23 1232  HGB 11.2* 11.2* 10.9*  HCT 33.0* 33.0* 32.0*   Basic Metabolic Panel: Recent Labs  Lab 08/15/23 1228 08/15/23 1231 08/15/23 1232  NA 138 136 135  K 4.1 4.1 4.1   GFR: Estimated Creatinine Clearance: 15.5 mL/min (A) (by C-G formula based on SCr of 3.34 mg/dL (H)). Liver Function Tests: No results for input(s): "AST", "ALT", "ALKPHOS", "BILITOT", "PROT", "ALBUMIN" in the last 168 hours. No results for input(s): "LIPASE", "AMYLASE" in the last 168 hours. No results for input(s): "AMMONIA" in the last 168 hours. Coagulation Profile: No results for input(s): "INR", "PROTIME" in the last 168 hours. Cardiac Enzymes: No results for input(s): "CKTOTAL", "CKMB", "CKMBINDEX", "TROPONINI" in the last 168 hours. BNP (last 3 results) No results for input(s): "PROBNP" in the last 8760 hours. HbA1C: No results for input(s): "HGBA1C" in the last 72 hours. CBG: No results for input(s): "GLUCAP" in the last 168 hours. Lipid Profile: No results for input(s): "CHOL", "HDL", "LDLCALC", "TRIG", "CHOLHDL", "LDLDIRECT" in the last 72 hours. Thyroid Function Tests: No results for input(s): "TSH", "T4TOTAL", "FREET4", "T3FREE", "THYROIDAB" in the last 72 hours. Anemia Panel: No results for input(s): "VITAMINB12", "FOLATE", "FERRITIN", "TIBC", "IRON", "RETICCTPCT" in the last 72 hours. Urine analysis:    Component Value Date/Time   COLORURINE YELLOW (A) 12/11/2021 1736   APPEARANCEUR HAZY (A) 12/11/2021 1736   LABSPEC 1.013 12/11/2021 1736   PHURINE 8.0 12/11/2021 1736   GLUCOSEU NEGATIVE 12/11/2021 1736   HGBUR NEGATIVE 12/11/2021 1736   BILIRUBINUR NEGATIVE 12/11/2021 1736   KETONESUR NEGATIVE 12/11/2021 1736   PROTEINUR 100 (A)  12/11/2021 1736   NITRITE NEGATIVE 12/11/2021 1736   LEUKOCYTESUR LARGE (A) 12/11/2021 1736    Radiological Exams on Admission: No results found.  EKG: Ordered.  Assessment/Plan Principal Problem:   CHF (congestive heart failure) (HCC) Active Problems:   ESRD (end stage renal disease) on dialysis (HCC)   Acute heart failure with preserved ejection fraction (HFpEF) (HCC)   Aortic stenosis  (please populate well all problems here in Problem List. (For example, if patient is on BP meds at home and you  resume or decide to hold them, it is a problem that needs to be her. Same for CAD, COPD, HLD and so on)  Severe aortic stenosis Near syncope Orthostatic hypotension -S/p elective LHC.  Given patient history of significant orthostatic hypotension along with her baseline condition of ESRD as well as severe AS, nephrology Dr. Cherylann Ratel recommended that we keep patient on telemonitoring overnight, if no signs of deterioration, likely can go home tomorrow. -Ordered chest x-ray, BMP and EKG  ESRD on HD -Euvolemic, last HD was yesterday -Likely can discharge home tomorrow and go to routine outpatient HD  HTN -Reviewed her most recent hospitalization record, will her BP medications were discontinued due to worsening of orthostatic hypotension probably associated with worsening of severe aortic stenosis.    DVT prophylaxis: Lovenox Code Status: Full code Family Communication: None at bedside Disposition Plan: Expect less than 2 midnight hospital stay Consults called: Nephrology Admission status: PCU observation  Emeline General MD [facility] Pager [phone]  08/15/2023, 1:18 PM

## 2023-08-16 ENCOUNTER — Other Ambulatory Visit: Payer: Self-pay | Admitting: *Deleted

## 2023-08-16 ENCOUNTER — Encounter: Payer: Self-pay | Admitting: Cardiology

## 2023-08-16 DIAGNOSIS — I350 Nonrheumatic aortic (valve) stenosis: Secondary | ICD-10-CM

## 2023-08-16 DIAGNOSIS — I5032 Chronic diastolic (congestive) heart failure: Secondary | ICD-10-CM | POA: Diagnosis not present

## 2023-08-16 LAB — BASIC METABOLIC PANEL WITH GFR
Anion gap: 10 (ref 5–15)
BUN: 68 mg/dL — ABNORMAL HIGH (ref 8–23)
CO2: 29 mmol/L (ref 22–32)
Calcium: 8.8 mg/dL — ABNORMAL LOW (ref 8.9–10.3)
Chloride: 95 mmol/L — ABNORMAL LOW (ref 98–111)
Creatinine, Ser: 4.08 mg/dL — ABNORMAL HIGH (ref 0.44–1.00)
GFR, Estimated: 12 mL/min — ABNORMAL LOW (ref >60)
Glucose, Bld: 89 mg/dL (ref 70–99)
Potassium: 4.2 mmol/L (ref 3.5–5.1)
Sodium: 134 mmol/L — ABNORMAL LOW (ref 135–145)

## 2023-08-16 LAB — HEPATITIS B SURFACE ANTIGEN: Hepatitis B Surface Ag: NONREACTIVE

## 2023-08-16 MED ORDER — DIPHENHYDRAMINE HCL 50 MG/ML IJ SOLN
25.0000 mg | Freq: Once | INTRAMUSCULAR | Status: AC
  Administered 2023-08-16: 25 mg via INTRAVENOUS

## 2023-08-16 MED ORDER — DIPHENHYDRAMINE HCL 50 MG/ML IJ SOLN
INTRAMUSCULAR | Status: AC
  Filled 2023-08-16: qty 1

## 2023-08-16 MED ORDER — PENTAFLUOROPROP-TETRAFLUOROETH EX AERO: 1.0000 | INHALATION_SPRAY | CUTANEOUS | Status: DC | PRN

## 2023-08-16 MED ORDER — HEPARIN SODIUM (PORCINE) 1000 UNIT/ML DIALYSIS: 1000.0000 [IU] | INTRAMUSCULAR | Status: DC | PRN

## 2023-08-16 MED ORDER — LIDOCAINE-PRILOCAINE 2.5-2.5 % EX CREA: 1.0000 | TOPICAL_CREAM | CUTANEOUS | Status: DC | PRN

## 2023-08-16 NOTE — Discharge Summary (Signed)
 Physician Discharge Summary  Katie [name] WNU:[id number] DOB: 03/31/1956 DOA: 08/15/2023  PCP: [facility]  Admit date: 08/15/2023 Discharge date: 08/16/2023  Admitted From: [facility] Disposition:  [facility]   Recommendations for Outpatient Follow-up:  Follow up with PCP in 1-2 weeks F/u w/ cardio, Dr. Azucena Cecil, in 1-2 weeks   Home Health: no  Equipment/Devices:  Discharge Condition: stable  CODE STATUS: DNR Diet recommendation: Heart Healthy  Brief/Interim Summary: HPI was taken from Dr. Chipper Herb:  Katie [name] is a 68 y.o. female with medical history significant of ESRD on HD MWF, HD associated hypotension, HTN, COPD, chronic HFpEF, moderate AS presented today for an elective LHC to evaluate AS status, she has no complaints.   Patient was hospitalized 1 month ago for near syncope episode, and she reported that she continued to have episodes of feeling lightheadedness, denied any fall or loss of consciousness.  She denied any chest pain, SOB. Her last HD was yesterday of 3 1/2 hours without any incident.   ED Course: SBP 100/59, non hypoxic. VBG 7.42/45/65, BMP pending. EKG is pending.  Discharge Diagnoses:  Principal Problem:   CHF (congestive heart failure) (HCC) Active Problems:   ESRD (end stage renal disease) on dialysis (HCC)   Acute heart failure with preserved ejection fraction (HFpEF) (HCC)   Aortic stenosis  Severe aortic stenosis: s/p elective LHC. Management as per cardio  Near syncope: likely secondary to severe aortic stenosis & orthostatic hypotension    ESRD: on HD MWF. Nephro following and recs apprec    HTN: currently WNL. Will continue to monitor   Discharge Instructions  Discharge Instructions     Diet - low sodium heart healthy   Complete by: As directed    Discharge instructions   Complete by: As directed    F/u w/ PCP in 1-2 weeks. F/u w/ cardio, Dr. Azucena Cecil, in 1-2 weeks   Increase activity slowly   Complete by: As  directed       Allergies as of 08/16/2023       Reactions   Penicillins Nausea And Vomiting   Pt states "only pills" make her vomit Other reaction(s): Nausea/Vomit        Medication List     TAKE these medications    acetaminophen 500 MG tablet Commonly known as: TYLENOL Take 500 mg by mouth 3 (three) times daily. Take additional 500 mg every 6 hours as needed for pain   Advair Diskus 250-50 MCG/ACT Aepb Generic drug: fluticasone-salmeterol Inhale 1 puff into the lungs 2 (two) times daily.   albuterol 108 (90 Base) MCG/ACT inhaler Commonly known as: VENTOLIN HFA Inhale 1 puff into the lungs every 6 (six) hours as needed for wheezing.   aspirin EC 81 MG tablet Take 1 tablet (81 mg total) by mouth daily. Swallow whole.   calcium carbonate 500 MG chewable tablet Commonly known as: TUMS - dosed in mg elemental calcium Chew 1 tablet by mouth 3 (three) times daily as needed (GRED).   DULoxetine 20 MG capsule Commonly known as: CYMBALTA Take 20 mg by mouth daily.   famotidine 20 MG tablet Commonly known as: PEPCID Take 20 mg by mouth at bedtime.   guaifenesin 100 MG/5ML syrup Commonly known as: ROBITUSSIN Take 200 mg by mouth every 4 (four) hours as needed for cough.   melatonin 3 MG Tabs tablet Take 3 mg by mouth at bedtime.   midodrine 5 MG tablet Commonly known as: PROAMATINE Take 5 mg by mouth. Monday, Wednesday, Friday  prior to dialysis   ondansetron 4 MG tablet Commonly known as: ZOFRAN Take 4 mg by mouth every Monday, Wednesday, and Friday.   oxyCODONE 5 MG immediate release tablet Commonly known as: Oxy IR/ROXICODONE Take 5 mg by mouth every 6 (six) hours.   pantoprazole 40 MG tablet Commonly known as: PROTONIX Take 1 tablet (40 mg total) by mouth 2 (two) times daily. What changed: when to take this   Polyethylene Glycol 3350 Powd Take 1 Capful by mouth daily. 17 g   REGULOID PO Take 0.4 g/1.40m2 by mouth 2 (two) times daily.    saccharomyces boulardii 250 MG capsule Commonly known as: FLORASTOR Take 250 mg by mouth daily.   Sarna Sensitive 1 % Lotn Generic drug: pramoxine Apply 1 Application topically 2 (two) times daily.   senna 8.6 MG Tabs tablet Commonly known as: SENOKOT Take 1 tablet by mouth at bedtime.   senna-docusate 8.6-50 MG tablet Commonly known as: Senokot-S Take 1 tablet by mouth 2 (two) times daily as needed for mild constipation. What changed: when to take this   topiramate 50 MG tablet Commonly known as: TOPAMAX Take 1 tablet (50 mg total) by mouth 2 (two) times daily as needed (headache). What changed: when to take this   traZODone 50 MG tablet Commonly known as: DESYREL Take 1 tablet (50 mg total) by mouth every evening. What changed: when to take this        Allergies  Allergen Reactions   Penicillins Nausea And Vomiting    Pt states "only pills" make her vomit Other reaction(s): Nausea/Vomit    Consultations: Cardio Nephro    Procedures/Studies: DG Chest 1 View Result Date: 08/16/2023 CLINICAL DATA:  Aortic stenosis EXAM: CHEST  1 VIEW COMPARISON:  Chest x-ray June 12, 2022 FINDINGS: The heart size and mediastinal contours are within normal limits. Both lungs are clear. The visualized skeletal structures are unremarkable. Chronic left pleural reaction. Mild prominence of the interstitial markings bilaterally correlate with chronic interstitial lung disease. IMPRESSION: *No acute cardiopulmonary process. *Chronic interstitial lung disease. Electronically Signed   By: Shaaron Adler M.D.   On: 08/16/2023 11:10   CARDIAC CATHETERIZATION Result Date: 08/15/2023 Images from the original result were not included.   Very Small Caliber Diagonal Branches: 1st Diag lesion is 90% stenosed. 2nd Diag lesion is 80% stenosed.   Mid LAD lesion is 55% stenosed.   LV end diastolic pressure is normal.   There is severe aortic valve stenosis.   Anticipated discharge date to be determined.    Plan per discussion with primary cardiology team and nephrologist would be to have the patient be admitted under the hospitalist service.  She will need to have dialysis done while she is here and have titration of medications.   Recommend Aspirin 81mg  daily for moderate CAD. Dominance: Codominant POST-OPERATIVE DIAGNOSIS:  Severe heavily calcified aortic stenosis with mean gradient by cardiac catheterization of 40 to 50 mmHg (peak to peak gradient 59 mmHg) Single-vessel disease with a 60% LAD stenosis just after tandem very small caliber D1 and D2 (ostium of D1-D2 a roughly 90 and 80%); large codominant LCx with relatively no significant disease; moderate caliber codominant RCA with minimal disease in the PDA. RHC hemodynamics: RAP 1 mmHg, RV P-EDP 44/1-2 mmHg; PAP-mean 44/12-24 mmHg, PCWP mean 5 mmHg.;  Fick Cardiac Output-Index 6.41-3.87 with ao sat 93% and PA sat 70%. LV P-EDP 191/1-23 mmHg; AO P-MAP 140/61-95 mmHg Aortic AoV gradient P-P59 0.5 mmHg; mean AoV gradient estimated 45  to 50 mmHg  PT TO BE ADMITTED PER NEPHROLOGIST, DR LATEEF   Plan per discussion with primary cardiology team and nephrologist would be to have the patient be admitted under the hospitalist service.  She will need to have dialysis done while she is here and have titration of medications.   Recommend Aspirin 81mg  daily for moderate CAD.   Will need outpatient structural heart team Bryan Lemma, MD   (Echo, Carotid, EGD, Colonoscopy, ERCP)    Subjective: pt c/o fatigue    Discharge Exam: Vitals:   08/16/23 1146 08/16/23 1240  BP: 112/66 (!) 142/63  Pulse: 80 77  Resp: 18   Temp: 98.1 F (36.7 C) 98.2 F (36.8 C)  SpO2: 100% 99%   Vitals:   08/16/23 1030 08/16/23 1100 08/16/23 1146 08/16/23 1240  BP: 134/63 128/66 112/66 (!) 142/63  Pulse: 73 79 80 77  Resp: 12 16 18    Temp:   98.1 F (36.7 C) 98.2 F (36.8 C)  TempSrc: Oral     SpO2: 98% 98% 100% 99%  Weight:   55.1 kg   Height:        General: Pt is  alert, awake, not in acute distress Cardiovascular:  S1/S2 +, no rubs, no gallops Respiratory: CTA bilaterally, no wheezing, no rhonchi Abdominal: Soft, NT, ND, bowel sounds + Extremities: no edema, no cyanosis    The results of significant diagnostics from this hospitalization (including imaging, microbiology, ancillary and laboratory) are listed below for reference.     Microbiology: No results found for this or any previous visit (from the past 240 hours).   Labs: BNP (last 3 results) No results for input(s): "BNP" in the last 8760 hours. Basic Metabolic Panel: Recent Labs  Lab 08/15/23 1228 08/15/23 1231 08/15/23 1232 08/15/23 1705 08/16/23 0549  NA 138 136 135  --  134*  K 4.1 4.1 4.1  --  4.2  CL  --   --   --   --  95*  CO2  --   --   --   --  29  GLUCOSE  --   --   --   --  89  BUN  --   --   --   --  68*  CREATININE  --   --   --  3.17* 4.08*  CALCIUM  --   --   --   --  8.8*   Liver Function Tests: No results for input(s): "AST", "ALT", "ALKPHOS", "BILITOT", "PROT", "ALBUMIN" in the last 168 hours. No results for input(s): "LIPASE", "AMYLASE" in the last 168 hours. No results for input(s): "AMMONIA" in the last 168 hours. CBC: Recent Labs  Lab 08/15/23 1228 08/15/23 1231 08/15/23 1232 08/15/23 1705  WBC  --   --   --  5.8  HGB 11.2* 11.2* 10.9* 10.5*  HCT 33.0* 33.0* 32.0* 32.3*  MCV  --   --   --  101.6*  PLT  --   --   --  143*   Cardiac Enzymes: No results for input(s): "CKTOTAL", "CKMB", "CKMBINDEX", "TROPONINI" in the last 168 hours. BNP: Invalid input(s): "POCBNP" CBG: No results for input(s): "GLUCAP" in the last 168 hours. D-Dimer No results for input(s): "DDIMER" in the last 72 hours. Hgb A1c No results for input(s): "HGBA1C" in the last 72 hours. Lipid Profile No results for input(s): "CHOL", "HDL", "LDLCALC", "TRIG", "CHOLHDL", "LDLDIRECT" in the last 72 hours. Thyroid function studies No results for input(s): "TSH", "T4TOTAL",  "T3FREE", "  THYROIDAB" in the last 72 hours.  Invalid input(s): "FREET3" Anemia work up No results for input(s): "VITAMINB12", "FOLATE", "FERRITIN", "TIBC", "IRON", "RETICCTPCT" in the last 72 hours. Urinalysis    Component Value Date/Time   COLORURINE YELLOW (A) 12/11/2021 1736   APPEARANCEUR HAZY (A) 12/11/2021 1736   LABSPEC 1.013 12/11/2021 1736   PHURINE 8.0 12/11/2021 1736   GLUCOSEU NEGATIVE 12/11/2021 1736   HGBUR NEGATIVE 12/11/2021 1736   BILIRUBINUR NEGATIVE 12/11/2021 1736   KETONESUR NEGATIVE 12/11/2021 1736   PROTEINUR 100 (A) 12/11/2021 1736   NITRITE NEGATIVE 12/11/2021 1736   LEUKOCYTESUR LARGE (A) 12/11/2021 1736   Sepsis Labs Recent Labs  Lab 08/15/23 1705  WBC 5.8   Microbiology No results found for this or any previous visit (from the past 240 hours).   Time coordinating discharge: Over 30 minutes  SIGNED:   Charise Killian, MD  [facility] 08/16/2023, 1:41 PM Pager   If 7PM-7AM, please contact night-coverage [email or web address]

## 2023-08-16 NOTE — Progress Notes (Signed)
 Received patient in bed to unit.  Alert and orientedx4  Informed consent signed and in chart.   TX duration:3.5 hours  Patient tolerated well.  Transported back to the room via bed Alert, without acute distress.  Hand-off given to patient's nurse. Milta Deiters RN  Access used: Left graft  Access issues: None  Total UF removed: 1500 Medication(s) given: Benedryl 25 mg x1 Post HD weight: 55.8 kg Post HD VS: 112/66, 85 pulse, 92 %  RA , 18 Resp.   Cheyenne Adas RN [facility]

## 2023-08-16 NOTE — Plan of Care (Signed)

## 2023-08-16 NOTE — [place] FL2 (Signed)
 [place] MEDICAID FL2 LEVEL OF CARE FORM     IDENTIFICATION  Patient Name: Katie [name] Birthdate: 29-Dec-1955 Sex: female Admission Date (Current Location): 08/15/2023  [facility] and [place] Number:  Chiropodist and Address:  [facility], [address] [place], [place] [address]      Provider Number: [id number]  Attending Physician Name and Address:  Charise Killian, MD  Relative Name and Phone Number:  Reda, Citron (Sister)  [phone] [facility])    Current Level of Care: Hospital Recommended Level of Care: Assisted Living Facility Prior Approval Number:    Date Approved/Denied:   PASRR Number:    Discharge Plan:  (Assisted Living)    Current Diagnoses: Patient Active Problem List   Diagnosis Date Noted   CHF (congestive heart failure) (HCC) 08/15/2023   Orthostatic hypotension 06/12/2022   Vomiting and diarrhea 06/12/2022   Cellulitis of left upper extremity 12/11/2021   Type II diabetes mellitus with renal manifestations (HCC) 12/11/2021   Anemia in ESRD (end-stage renal disease) (HCC) 12/11/2021   Elevated lactic acid level 12/11/2021   Chronic diastolic CHF (congestive heart failure) (HCC) 12/11/2021   Left arm cellulitis 11/09/2021   Adjustment disorder with mixed anxiety and depressed mood 11/07/2021   Aortic stenosis 11/01/2021   Fecal impaction (HCC) 10/27/2021   Bent bone fracture of ulna 10/24/2021   Hospital acquired PNA 10/24/2021   Sepsis (HCC) 10/23/2021   Cellulitis 10/23/2021   Right flank pain 09/23/2021   Iron deficiency anemia, unspecified 03/27/2021   Allergy, unspecified, initial encounter 03/22/2021   Anaphylactic shock, unspecified, initial encounter 03/22/2021   Unspecified jaundice 03/15/2021   Allergy status to penicillin 03/14/2021   Anemia in chronic kidney disease 03/14/2021   Chronic diastolic (congestive) heart failure (HCC) 03/14/2021   Dependence on renal dialysis (HCC) 03/14/2021    Depression, unspecified 03/14/2021   Hypertensive heart and chronic kidney disease with heart failure and with stage 5 chronic kidney disease, or end stage renal disease (HCC) 03/14/2021   Personal history of nicotine dependence 03/14/2021   Secondary hyperparathyroidism of renal origin (HCC) 03/14/2021   Frequent falls 03/07/2021   Financial difficulty 03/07/2021   ESRD (end stage renal disease) on dialysis (HCC) 12/23/2020   Persistent headaches 06/27/2020   ATN (acute tubular necrosis) (HCC)    Pulmonary hypertension, unspecified (HCC)    Chronic kidney disease (CKD), stage IV (severe) (HCC)    Pleural effusion    Acute heart failure with preserved ejection fraction (HFpEF) (HCC)    Pressure injury of skin 05/01/2020   Hyperkalemia    Volume overload 04/30/2020   Hypertension    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HCC)    Type 2 diabetes mellitus (HCC)     Orientation RESPIRATION BLADDER Height & Weight     Self, Time, Situation, Place     (Oliguria) Weight: 55.1 kg Height:  5\' 6"  (167.6 cm)  BEHAVIORAL SYMPTOMS/MOOD NEUROLOGICAL BOWEL NUTRITION STATUS  Other (Comment) (n/a)  (n/a) Continent Diet  AMBULATORY STATUS COMMUNICATION OF NEEDS Skin   Limited Assist Verbally (hoarse) Other (Comment) (Dry, eechymosis  right arm)                       Personal Care Assistance Level of Assistance  Bathing, Dressing Bathing Assistance: Limited assistance   Dressing Assistance: Limited assistance     Functional Limitations Info  Sight Sight Info: 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 Take additional 500 mg every 6 hours as needed for pain    Advair Diskus 250-50 MCG/ACT Aepb Generic drug: fluticasone-salmeterol Inhale 1 puff into the lungs 2 (two) times daily.    albuterol 108 (90 Base) MCG/ACT inhaler Commonly known as: VENTOLIN HFA Inhale 1 puff into the lungs every 6 (six) hours as needed for wheezing.    aspirin EC 81 MG tablet Take 1 tablet (81 mg total) by mouth daily. Swallow whole.    calcium carbonate 500 MG chewable tablet Commonly known as: TUMS - dosed in mg elemental calcium Chew 1 tablet by mouth 3 (three) times daily as needed (GRED).    DULoxetine 20 MG capsule Commonly known as: CYMBALTA Take 20 mg by mouth daily.    famotidine 20 MG tablet Commonly known as: PEPCID Take 20 mg by mouth at bedtime.    guaifenesin 100 MG/5ML syrup Commonly known as: ROBITUSSIN Take 200 mg by mouth every 4 (four) hours as needed for cough.    melatonin 3 MG Tabs tablet Take 3 mg by mouth at bedtime.    midodrine 5 MG tablet Commonly known as: PROAMATINE Take 5 mg by mouth. Monday, Wednesday, Friday prior to dialysis    ondansetron 4 MG tablet Commonly known as: ZOFRAN Take 4 mg by mouth every Monday, Wednesday, and Friday.    oxyCODONE 5 MG immediate release tablet Commonly known as: Oxy IR/ROXICODONE Take 5 mg by mouth every 6 (six) hours.    pantoprazole 40 MG tablet Commonly known as: PROTONIX Take 1 tablet (40 mg total) by mouth 2 (two) times daily. What changed: when to take this    Polyethylene Glycol 3350 Powd Take 1 Capful by mouth daily. 17 g    REGULOID PO Take 0.4 g/1.56m2 by mouth 2 (two) times daily.    saccharomyces boulardii 250 MG capsule Commonly known as: FLORASTOR Take 250 mg by mouth daily.    Sarna Sensitive 1 % Lotn Generic drug: pramoxine Apply 1 Application topically 2 (two) times daily.    senna 8.6 MG Tabs tablet Commonly known as: SENOKOT Take 1  tablet by mouth at bedtime.    senna-docusate 8.6-50 MG tablet Commonly known as: Senokot-S Take 1 tablet by mouth 2 (two) times daily as needed for mild constipation. What changed: when to take this    topiramate 50 MG tablet Commonly known as: TOPAMAX Take 1 tablet (50 mg total) by mouth 2 (two) times daily as needed (headache). What changed: when to take this    traZODone 50 MG tablet Commonly known as: DESYREL Take 1 tablet (50 mg total) by mouth every evening. What changed: when to take this     Discharge Medications: Please see discharge summary for a list of discharge medications.  Relevant Imaging Results:  Relevant Lab Results:   Additional Information SS# [id number]  Truddie Hidden, RN

## 2023-08-16 NOTE — Progress Notes (Addendum)
 [facility]  ROUNDING NOTE   Subjective:   Katie [name] is a 68 year old female with past medical history of ESRD with hemodialysis TTS, diabetes mellitus, and hypertension.  Patient reported to hospital for scheduled right and left heart cath.  Outpatient nephrologist Dr. Cherylann Ratel requested patient remain overnight for observation and receive dialysis inpatient today.  Patient is known to our practice and receives outpatient dialysis treatments at [facility] on a MWF schedule. Patient seen and evaluated during dialysis.   HEMODIALYSIS FLOWSHEET:  Blood Flow Rate (mL/min): 400 mL/min Arterial Pressure (mmHg): -161.81 mmHg Venous Pressure (mmHg): 252.11 mmHg TMP (mmHg): -1.82 mmHg Ultrafiltration Rate (mL/min): 686 mL/min Dialysate Flow Rate (mL/min): 300 ml/min  Patient appears in good spirits.  No implants to offer.  Denies pain or discomfort.  Remains on room air with no lower extremity edema.  Patient reports no recent missed treatments.  Labs collected with dialysis today unremarkable for renal patient.  Cardiac cath on 08/15/2023 shows severe heavily calcified aortic stenosis, and single-vessel disease with 60% LAD stenosis.  We have been consulted to provide dialysis during this admission.   Objective:  Vital signs in last 24 hours:  Temp:  [98 F (36.7 C)-98.5 F (36.9 C)] 98.1 F (36.7 C) (04/04 1146) Pulse Rate:  [73-101] 80 (04/04 1146) Resp:  [12-33] 18 (04/04 1146) BP: (72-151)/(27-91) 112/66 (04/04 1146) SpO2:  [94 %-100 %] 100 % (04/04 1146) Weight:  [55.1 kg-58.7 kg] 55.1 kg (04/04 1146)  Weight change:  Filed Weights   08/16/23 0755 08/16/23 0757 08/16/23 1146  Weight: 58 kg 58 kg 55.1 kg    Intake/Output: I/O last 3 completed shifts: In: 360 [P.O.:360] Out: -    Intake/Output this shift:  Total I/O In: -  Out: 1500 [Other:1500]  Physical Exam: General: NAD, resting in bed  Head: Normocephalic, atraumatic. Moist oral mucosal  membranes  Eyes: Anicteric  Lungs:  Clear to auscultation  Heart: Regular rate and rhythm  Abdomen:  Soft, nontender  Extremities:  No peripheral edema.  Neurologic: Alert and oriented, moving all four extremities  Skin: No lesions  Access: Lt AVG    Basic Metabolic Panel: Recent Labs  Lab 08/15/23 1228 08/15/23 1231 08/15/23 1232 08/15/23 1705 08/16/23 0549  NA 138 136 135  --  134*  K 4.1 4.1 4.1  --  4.2  CL  --   --   --   --  95*  CO2  --   --   --   --  29  GLUCOSE  --   --   --   --  89  BUN  --   --   --   --  68*  CREATININE  --   --   --  3.17* 4.08*  CALCIUM  --   --   --   --  8.8*    Liver Function Tests: No results for input(s): "AST", "ALT", "ALKPHOS", "BILITOT", "PROT", "ALBUMIN" in the last 168 hours. No results for input(s): "LIPASE", "AMYLASE" in the last 168 hours. No results for input(s): "AMMONIA" in the last 168 hours.  CBC: Recent Labs  Lab 08/15/23 1228 08/15/23 1231 08/15/23 1232 08/15/23 1705  WBC  --   --   --  5.8  HGB 11.2* 11.2* 10.9* 10.5*  HCT 33.0* 33.0* 32.0* 32.3*  MCV  --   --   --  101.6*  PLT  --   --   --  143*    Cardiac Enzymes: No  results for input(s): "CKTOTAL", "CKMB", "CKMBINDEX", "TROPONINI" in the last 168 hours.  BNP: Invalid input(s): "POCBNP"  CBG: No results for input(s): "GLUCAP" in the last 168 hours.  Microbiology: Results for orders placed or performed during the hospital encounter of 06/12/22  Blood culture (routine x 2)     Status: None   Collection Time: 06/12/22  7:30 AM   Specimen: BLOOD RIGHT ARM  Result Value Ref Range Status   Specimen Description BLOOD RIGHT ARM  Final   Special Requests   Final    BOTTLES DRAWN AEROBIC AND ANAEROBIC Blood Culture adequate volume   Culture   Final    NO GROWTH 5 DAYS Performed at [facility], [address] [place], [place] [address]    Report Status 06/17/2022 FINAL  Final  Resp panel by RT-PCR (RSV, Flu A&B, Covid) Anterior Nasal Swab      Status: None   Collection Time: 06/12/22  7:32 AM   Specimen: Anterior Nasal Swab  Result Value Ref Range Status   SARS Coronavirus 2 by RT PCR NEGATIVE NEGATIVE Final    Comment: (NOTE) SARS-CoV-2 target nucleic acids are NOT DETECTED.  The SARS-CoV-2 RNA is generally detectable in upper respiratory specimens during the acute phase of infection. The lowest concentration of SARS-CoV-2 viral copies this assay can detect is 138 copies/mL. A negative result does not preclude SARS-Cov-2 infection and should not be used as the sole basis for treatment or other patient management decisions. A negative result may occur with  improper specimen collection/handling, submission of specimen other than nasopharyngeal swab, presence of viral mutation(s) within the areas targeted by this assay, and inadequate number of viral copies(<138 copies/mL). A negative result must be combined with clinical observations, patient history, and epidemiological information. The expected result is Negative.  Fact Sheet for Patients:  BloggerCourse.com  Fact Sheet for Healthcare Providers:  SeriousBroker.it  This test is no t yet approved or cleared by the [place] FDA and  has been authorized for detection and/or diagnosis of SARS-CoV-2 by FDA under an Emergency Use Authorization (EUA). This EUA will remain  in effect (meaning this test can be used) for the duration of the COVID-19 declaration under Section 564(b)(1) of the Act, 21 U.S.C.section 360bbb-3(b)(1), unless the authorization is terminated  or revoked sooner.       Influenza A by PCR NEGATIVE NEGATIVE Final   Influenza B by PCR NEGATIVE NEGATIVE Final    Comment: (NOTE) The Xpert Xpress SARS-CoV-2/FLU/RSV plus assay is intended as an aid in the diagnosis of influenza from Nasopharyngeal swab specimens and should not be used as a sole basis for treatment. Nasal washings and aspirates are  unacceptable for Xpert Xpress SARS-CoV-2/FLU/RSV testing.  Fact Sheet for Patients: BloggerCourse.com  Fact Sheet for Healthcare Providers: SeriousBroker.it  This test is not yet approved or cleared by the [place] FDA and has been authorized for detection and/or diagnosis of SARS-CoV-2 by FDA under an Emergency Use Authorization (EUA). This EUA will remain in effect (meaning this test can be used) for the duration of the COVID-19 declaration under Section 564(b)(1) of the Act, 21 U.S.C. section 360bbb-3(b)(1), unless the authorization is terminated or revoked.     Resp Syncytial Virus by PCR NEGATIVE NEGATIVE Final    Comment: (NOTE) Fact Sheet for Patients: BloggerCourse.com  Fact Sheet for Healthcare Providers: SeriousBroker.it  This test is not yet approved or cleared by the [place] FDA and has been authorized for detection and/or diagnosis of SARS-CoV-2 by  FDA under an Emergency Use Authorization (EUA). This EUA will remain in effect (meaning this test can be used) for the duration of the COVID-19 declaration under Section 564(b)(1) of the Act, 21 U.S.C. section 360bbb-3(b)(1), unless the authorization is terminated or revoked.  Performed at [facility], [address] [place], [place] [address]   Blood culture (routine x 2)     Status: None   Collection Time: 06/12/22  9:59 PM   Specimen: BLOOD RIGHT HAND  Result Value Ref Range Status   Specimen Description BLOOD RIGHT HAND  Final   Special Requests   Final    BOTTLES DRAWN AEROBIC AND ANAEROBIC Blood Culture adequate volume   Culture   Final    NO GROWTH 5 DAYS Performed at [facility], [address] [place], [place] [address]    Report Status 06/17/2022 FINAL  Final  Gastrointestinal Panel by PCR , Stool     Status: Abnormal   Collection Time: 06/14/22  2:07 AM   Specimen:  STOOL  Result Value Ref Range Status   Campylobacter species NOT DETECTED NOT DETECTED Final   Plesimonas shigelloides NOT DETECTED NOT DETECTED Final   Salmonella species NOT DETECTED NOT DETECTED Final   Yersinia enterocolitica NOT DETECTED NOT DETECTED Final   Vibrio species NOT DETECTED NOT DETECTED Final   Vibrio cholerae NOT DETECTED NOT DETECTED Final   Enteroaggregative E coli (EAEC) NOT DETECTED NOT DETECTED Final   Enteropathogenic E coli (EPEC) NOT DETECTED NOT DETECTED Final   Enterotoxigenic E coli (ETEC) NOT DETECTED NOT DETECTED Final   Shiga like toxin producing E coli (STEC) NOT DETECTED NOT DETECTED Final   Shigella/Enteroinvasive E coli (EIEC) NOT DETECTED NOT DETECTED Final   Cryptosporidium NOT DETECTED NOT DETECTED Final   Cyclospora cayetanensis NOT DETECTED NOT DETECTED Final   Entamoeba histolytica NOT DETECTED NOT DETECTED Final   Giardia lamblia NOT DETECTED NOT DETECTED Final   Adenovirus F40/41 NOT DETECTED NOT DETECTED Final   Astrovirus NOT DETECTED NOT DETECTED Final   Norovirus GI/GII NOT DETECTED NOT DETECTED Final   Rotavirus A NOT DETECTED NOT DETECTED Final   Sapovirus (I, II, IV, and V) DETECTED (A) NOT DETECTED Final    Comment: Performed at [facility], [address] [place], [place] [address]  C Difficile Quick Screen w PCR reflex     Status: None   Collection Time: 06/14/22  2:07 AM   Specimen: STOOL  Result Value Ref Range Status   C Diff antigen NEGATIVE NEGATIVE Final   C Diff toxin NEGATIVE NEGATIVE Final   C Diff interpretation No C. difficile detected.  Final    Comment: Performed at [facility], [address] [place], [place] [address]    Coagulation Studies: No results for input(s): "LABPROT", "INR" in the last 72 hours.  Urinalysis: No results for input(s): "COLORURINE", "LABSPEC", "PHURINE", "GLUCOSEU", "HGBUR", "BILIRUBINUR", "KETONESUR", "PROTEINUR", "UROBILINOGEN", "NITRITE", "LEUKOCYTESUR" in the  last 72 hours.  Invalid input(s): "APPERANCEUR"    Imaging: DG Chest 1 View Result Date: 08/16/2023 CLINICAL DATA:  Aortic stenosis EXAM: CHEST  1 VIEW COMPARISON:  Chest x-ray June 12, 2022 FINDINGS: The heart size and mediastinal contours are within normal limits. Both lungs are clear. The visualized skeletal structures are unremarkable. Chronic left pleural reaction. Mild prominence of the interstitial markings bilaterally correlate with chronic interstitial lung disease. IMPRESSION: *No acute cardiopulmonary process. *Chronic interstitial lung disease. Electronically Signed   By: Shaaron Adler M.D.   On: 08/16/2023  11:10   CARDIAC CATHETERIZATION Result Date: 08/15/2023 Images from the original result were not included.   Very Small Caliber Diagonal Branches: 1st Diag lesion is 90% stenosed. 2nd Diag lesion is 80% stenosed.   Mid LAD lesion is 55% stenosed.   LV end diastolic pressure is normal.   There is severe aortic valve stenosis.   Anticipated discharge date to be determined.   Plan per discussion with primary cardiology team and nephrologist would be to have the patient be admitted under the hospitalist service.  She will need to have dialysis done while she is here and have titration of medications.   Recommend Aspirin 81mg  daily for moderate CAD. Dominance: Codominant POST-OPERATIVE DIAGNOSIS:  Severe heavily calcified aortic stenosis with mean gradient by cardiac catheterization of 40 to 50 mmHg (peak to peak gradient 59 mmHg) Single-vessel disease with a 60% LAD stenosis just after tandem very small caliber D1 and D2 (ostium of D1-D2 a roughly 90 and 80%); large codominant LCx with relatively no significant disease; moderate caliber codominant RCA with minimal disease in the PDA. RHC hemodynamics: RAP 1 mmHg, RV P-EDP 44/1-2 mmHg; PAP-mean 44/12-24 mmHg, PCWP mean 5 mmHg.;  Fick Cardiac Output-Index 6.41-3.87 with ao sat 93% and PA sat 70%. LV P-EDP 191/1-23 mmHg; AO P-MAP 140/61-95 mmHg  Aortic AoV gradient P-P59 0.5 mmHg; mean AoV gradient estimated 45 to 50 mmHg  PT TO BE ADMITTED PER NEPHROLOGIST, DR LATEEF   Plan per discussion with primary cardiology team and nephrologist would be to have the patient be admitted under the hospitalist service.  She will need to have dialysis done while she is here and have titration of medications.   Recommend Aspirin 81mg  daily for moderate CAD.   Will need outpatient structural heart team Bryan Lemma, MD     Medications:    sodium chloride      acetaminophen  500 mg Oral TID   aspirin EC  81 mg Oral Daily   Chlorhexidine Gluconate Cloth  6 each Topical Q0600   DULoxetine  20 mg Oral Daily   famotidine  20 mg Oral QHS   heparin  5,000 Units Subcutaneous Q8H   melatonin  5 mg Oral QHS   mometasone-formoterol  2 puff Inhalation BID   ondansetron  4 mg Oral Q M,W,F   oxyCODONE  5 mg Oral Q6H   pantoprazole  40 mg Oral Daily   saccharomyces boulardii  250 mg Oral Daily   senna  1 tablet Oral QHS   sodium chloride flush  3 mL Intravenous Q12H   traZODone  50 mg Oral QHS   sodium chloride, albuterol, guaiFENesin, ondansetron (ZOFRAN) IV, senna-docusate, sodium chloride flush, topiramate  Assessment/ Plan:  [name] is a 68 y.o.  female with past medical history of ESRD with hemodialysis TTS, diabetes mellitus, and hypertension.  Patient reported to hospital for scheduled right and left heart cath.  Outpatient nephrologist Dr. Cherylann Ratel requested patient remain overnight for observation and receive dialysis inpatient today.  [facility] MWF/left AVG  Orthostatic hypotension, status post right and left heart cath.  Left heart cath showed severe heavily calcified aortic stenosis, and single-vessel disease with 60% LAD stenosis.  End-stage renal disease on hemodialysis.  Patient receiving dialysis today, UF 1.5 L achieved.  Next treatment scheduled for Monday.  Patient cleared to discharge from renal  standpoint.  Anemia of chronic kidney disease Lab Results  Component Value Date   HGB 10.5 (L) 08/15/2023   Hemoglobin remains appropriate  for renal patient.  No need for ESA's at this time  4. Secondary Hyperparathyroidism: with outpatient labs: PTH 379, phosphorus 5.2, calcium 8.3 on 07/22/23.   Lab Results  Component Value Date   PTH 70 (H) 05/12/2020   CALCIUM 8.8 (L) 08/16/2023   CAION 1.13 (L) 08/15/2023   PHOS 4.0 06/16/2022    Will continue to monitor bone minerals during this admission.  Patient prescribed calcium acetate and calcitriol outpatient.   LOS: 1 Katie [name] 4/4/202512:26 PM   Patient was seen and examined with Wendee Beavers, NP.  Plan of care was formulated for the problems addressed and discussed with NP.  I agree with the note as documented except as noted below.

## 2023-08-16 NOTE — TOC Progression Note (Addendum)
 Transition of Care [facility]) - Progression Note    Patient Details  Name: Bessye Stith MRN: [id number] Date of Birth: December 20, 1955  [facility] CM/SW Contact  Truddie Hidden, RN Phone Number: 08/16/2023, 12:53 PM  Clinical Narrative:    Attempt to contact an Production designer, theatre/television/film at [facility] regarding patient's return to facility. No answer. Unable to leave a VM.   Spoke with Laurelyn Sickle at facility. Patient can return to facility. Facility will provide transportation and will transport patient at approximately 2:30pm. MD and nurse notified.              Expected Discharge Plan and Services                                               Social Determinants of Health (SDOH) Interventions SDOH Screenings   Food Insecurity: Patient Declined (08/15/2023)  Housing: Unknown (08/15/2023)  Transportation Needs: Unknown (08/15/2023)  Utilities: Patient Declined (08/15/2023)  Social Connections: Patient Declined (08/15/2023)  Tobacco Use: Medium Risk (08/15/2023)    Readmission Risk Interventions    06/13/2022    4:02 PM 09/27/2021    1:44 PM 09/26/2021   12:46 PM  Readmission Risk Prevention Plan  Transportation Screening Complete  Complete  HRI or Home Care Consult  --   Palliative Care Screening  Not Applicable   Medication Review (RN Care Manager) Complete  Complete  HRI or Home Care Consult Complete  --  SW Recovery Care/Counseling Consult   Complete  Palliative Care Screening Not Applicable  Not Applicable  Skilled Nursing Facility Not Applicable  Not Applicable

## 2023-08-17 LAB — HEPATITIS B SURFACE ANTIBODY, QUANTITATIVE: Hep B S AB Quant (Post): 13 m[IU]/mL

## 2023-08-19 ENCOUNTER — Other Ambulatory Visit: Payer: Self-pay

## 2023-08-19 ENCOUNTER — Emergency Department
Admission: EM | Admit: 2023-08-19 | Discharge: 2023-08-19 | Disposition: A | Discharge status: Home / Self Care | Attending: Emergency Medicine | Admitting: Emergency Medicine

## 2023-08-19 DIAGNOSIS — I959 Hypotension, unspecified: Secondary | ICD-10-CM | POA: Diagnosis present

## 2023-08-19 DIAGNOSIS — Z992 Dependence on renal dialysis: Secondary | ICD-10-CM | POA: Diagnosis not present

## 2023-08-19 DIAGNOSIS — N186 End stage renal disease: Secondary | ICD-10-CM | POA: Diagnosis not present

## 2023-08-19 DIAGNOSIS — I132 Hypertensive heart and chronic kidney disease with heart failure and with stage 5 chronic kidney disease, or end stage renal disease: Secondary | ICD-10-CM | POA: Diagnosis not present

## 2023-08-19 DIAGNOSIS — I509 Heart failure, unspecified: Secondary | ICD-10-CM | POA: Insufficient documentation

## 2023-08-19 LAB — CBC
HCT: 35 % — ABNORMAL LOW (ref 36.0–46.0)
Hemoglobin: 11.2 g/dL — ABNORMAL LOW (ref 12.0–15.0)
MCH: 33.1 pg (ref 26.0–34.0)
MCHC: 32 g/dL (ref 30.0–36.0)
MCV: 103.6 fL — ABNORMAL HIGH (ref 80.0–100.0)
Platelets: 130 10*3/uL — ABNORMAL LOW (ref 150–400)
RBC: 3.38 MIL/uL — ABNORMAL LOW (ref 3.87–5.11)
RDW: 12.9 % (ref 11.5–15.5)
WBC: 7.5 10*3/uL (ref 4.0–10.5)
nRBC: 0 % (ref 0.0–0.2)

## 2023-08-19 LAB — BASIC METABOLIC PANEL WITH GFR
Anion gap: 12 (ref 5–15)
BUN: 44 mg/dL — ABNORMAL HIGH (ref 8–23)
CO2: 28 mmol/L (ref 22–32)
Calcium: 8.3 mg/dL — ABNORMAL LOW (ref 8.9–10.3)
Chloride: 96 mmol/L — ABNORMAL LOW (ref 98–111)
Creatinine, Ser: 2.55 mg/dL — ABNORMAL HIGH (ref 0.44–1.00)
GFR, Estimated: 20 mL/min — ABNORMAL LOW (ref >60)
Glucose, Bld: 100 mg/dL — ABNORMAL HIGH (ref 70–99)
Potassium: 4.4 mmol/L (ref 3.5–5.1)
Sodium: 136 mmol/L (ref 135–145)

## 2023-08-19 LAB — LIPOPROTEIN A (LPA): Lipoprotein (a): 69 nmol/L — ABNORMAL HIGH (ref <75.0)

## 2023-08-19 MED ORDER — HYDROCODONE-ACETAMINOPHEN 5-325 MG PO TABS
1.0000 | ORAL_TABLET | Freq: Once | ORAL | Status: AC
  Administered 2023-08-19: 1 via ORAL
  Filled 2023-08-19: qty 1

## 2023-08-19 MED ORDER — SODIUM CHLORIDE 0.9 % IV BOLUS
500.0000 mL | Freq: Once | INTRAVENOUS | Status: AC
  Administered 2023-08-19: 500 mL via INTRAVENOUS

## 2023-08-19 MED ORDER — FENTANYL CITRATE PF 50 MCG/ML IJ SOSY
50.0000 ug | PREFILLED_SYRINGE | Freq: Once | INTRAMUSCULAR | Status: AC
  Administered 2023-08-19: 50 ug via INTRAVENOUS
  Filled 2023-08-19: qty 1

## 2023-08-19 NOTE — ED Notes (Signed)
 [facility] here to transport pt back to [facility]

## 2023-08-19 NOTE — ED Notes (Signed)
Report called to [facility]     

## 2023-08-19 NOTE — ED Notes (Signed)
 Called to [facility] Per RN Shanda Bumps @824  pm/Transport to [facility].

## 2023-08-19 NOTE — ED Provider Notes (Signed)
 [facility] Provider Note    Event Date/Time   First MD Initiated Contact with Patient 08/19/23 1800     (approximate)  History   Chief Complaint: Hypotension  HPI  Katie [name] is a 68 y.o. female with a past medical history of ESRD on HD Monday/Wednesday/Friday, hypertension, COPD, CHF who presents to the emergency department for hypotension/lightheadedness as well as diffuse cramping/pain.  According to the patient she had just finished her complete session of dialysis and then developed weakness became lightheaded and vomited.  Patient was found have a blood pressure in the 70s over 30s.  Patient states this has happened multiple times following dialysis when her blood pressure will drop.  Patient was experiencing some pain and cramping throughout her body which she states is typical when her blood pressure drops as well.  Patient ultimately decided to come to the emergency department for evaluation.  Patient denies any recent illnesses denies any chest pain.   Physical Exam   Triage Vital Signs: ED Triage Vitals  Encounter Vitals Group     BP 08/19/23 1548 (!) 99/49     Systolic BP Percentile --      Diastolic BP Percentile --      Pulse Rate 08/19/23 1548 88     Resp 08/19/23 1548 18     Temp 08/19/23 1548 98.4 F (36.9 C)     Temp Source 08/19/23 1548 Oral     SpO2 08/19/23 1548 93 %     Weight 08/19/23 1547 129 lb (58.5 kg)     Height 08/19/23 1547 5\' 6"  (1.676 m)     Head Circumference --      Peak Flow --      Pain Score 08/19/23 1548 8     Pain Loc --      Pain Education --      Exclude from Growth Chart --     Most recent vital signs: Vitals:   08/19/23 1548  BP: (!) 99/49  Pulse: 88  Resp: 18  Temp: 98.4 F (36.9 C)  SpO2: 93%    General: Awake, no distress.  CV:  Good peripheral perfusion.  Regular rate and rhythm  Resp:  Normal effort.  Equal breath sounds bilaterally.  Abd:  No distention.  Soft, nontender.  No rebound or  guarding.  ED Results / Procedures / Treatments   EKG  EKG viewed and interpreted by myself shows sinus rhythm at 91 bpm with a narrow QRS, normal axis, normal intervals, nonspecific ST changes.  No ST elevation.  MEDICATIONS ORDERED IN ED: Medications  sodium chloride 0.9 % bolus 500 mL (has no administration in time range)     IMPRESSION / MDM / ASSESSMENT AND PLAN / ED COURSE  I reviewed the triage vital signs and the nursing notes.  Patient's presentation is most consistent with acute presentation with potential threat to life or bodily function.  Patient presents to the emergency department with complaints of lightheadedness nausea vomiting following dialysis when the patient's blood pressure had dropped to 70s/30s.  Patient states she has a history of the same previously following episodes of dialysis.  We will check labs including a CBC chemistry we will IV hydrate with 500 cc of normal saline and we will dose fentanyl for the patient's pain relief.  Reassuring patient's blood pressure has come up to 99/49 prior to any intervention and she states she is already feeling much better.  No chest pain at any point.  Patient's labs are reassuring, CBC shows no significant finding and chemistry reassuring with a potassium of 4.4.  Patient's blood pressure has come up nicely after 500 cc normal saline currently 147/71.  Given the patient's reassuring workup with return to normal/hypertensive blood pressure we will discharge the patient home with outpatient follow-up.  Patient agreeable to plan of care.  FINAL CLINICAL IMPRESSION(S) / ED DIAGNOSES   Hypotension Dizziness   Note:  This document was prepared using Dragon voice recognition software and may include unintentional dictation errors.   Minna Antis, MD 08/19/23 [phone]

## 2023-08-19 NOTE — ED Notes (Signed)
 After triage, patient states she is going to leave.

## 2023-08-19 NOTE — ED Triage Notes (Signed)
 Patient states she was at dialysis, had full treatment and became hypotensive at 70's/30's.

## 2023-08-19 NOTE — Discharge Instructions (Signed)
 Please follow-up with your doctor regarding today's low blood pressure episode following dialysis.  Your workup in the emergency department is reassuring.  Your blood pressure has returned to normal following fluids.  Return to the emergency department for any symptom concerning to yourself.

## 2023-08-22 ENCOUNTER — Institutional Professional Consult (permissible substitution): Admitting: Cardiovascular Disease

## 2023-08-26 ENCOUNTER — Ambulatory Visit (HOSPITAL_BASED_OUTPATIENT_CLINIC_OR_DEPARTMENT_OTHER): Admitting: Pain Medicine

## 2023-08-26 DIAGNOSIS — Z79899 Other long term (current) drug therapy: Secondary | ICD-10-CM | POA: Insufficient documentation

## 2023-08-26 DIAGNOSIS — Z91199 Patient's noncompliance with other medical treatment and regimen due to unspecified reason: Secondary | ICD-10-CM

## 2023-08-26 DIAGNOSIS — G8929 Other chronic pain: Secondary | ICD-10-CM

## 2023-08-26 DIAGNOSIS — M899 Disorder of bone, unspecified: Secondary | ICD-10-CM | POA: Insufficient documentation

## 2023-08-26 DIAGNOSIS — G894 Chronic pain syndrome: Secondary | ICD-10-CM | POA: Insufficient documentation

## 2023-08-26 DIAGNOSIS — Z789 Other specified health status: Secondary | ICD-10-CM | POA: Insufficient documentation

## 2023-08-26 NOTE — Progress Notes (Signed)
 (08/26/2023) NO-SHOW to initial evaluation secondary to being in dialysis.  Historic Controlled Substance Pharmacotherapy Review  PMP and historical list of controlled substances: Oxycodone IR 5 mg capsule, 1 cap p.o. 4 times daily (120/month) (last filled on 06/29/2023); oxycodone/APAP 5/325, 1 tab p.o. 3 times daily (90/month) (last filled on 12/05/2021) Most recently prescribed opioid analgesics: Oxycodone IR 5 mg capsule, 1 cap p.o. 4 times daily (120/month) (last filled on 06/29/2023) MME/day: 30 mg/day  Historical Background Evaluation: [place] PMP: PDMP reviewed during this encounter. Review of the past 61-months conducted.             PMP NARX Score Report:  Narcotic: 320 Sedative: 150 Stimulant: 000  Risk Assessment Profile: PMP NARX Overdose Risk Score: 220  Meds   Current Outpatient Medications:    acetaminophen (TYLENOL) 500 MG tablet, Take 500 mg by mouth 3 (three) times daily. Take additional 500 mg every 6 hours as needed for pain, Disp: , Rfl:    ADVAIR DISKUS 250-50 MCG/ACT AEPB, Inhale 1 puff into the lungs 2 (two) times daily., Disp: , Rfl:    albuterol (VENTOLIN HFA) 108 (90 Base) MCG/ACT inhaler, Inhale 1 puff into the lungs every 6 (six) hours as needed for wheezing., Disp: , Rfl:    aspirin EC 81 MG tablet, Take 1 tablet (81 mg total) by mouth daily. Swallow whole., Disp: , Rfl:    calcium carbonate (TUMS - DOSED IN MG ELEMENTAL CALCIUM) 500 MG chewable tablet, Chew 1 tablet by mouth 3 (three) times daily as needed (GRED)., Disp: , Rfl:    DULoxetine (CYMBALTA) 20 MG capsule, Take 20 mg by mouth daily., Disp: , Rfl:    famotidine (PEPCID) 20 MG tablet, Take 20 mg by mouth at bedtime., Disp: , Rfl:    guaifenesin (ROBITUSSIN) 100 MG/5ML syrup, Take 200 mg by mouth every 4 (four) hours as needed for cough., Disp: , Rfl:    melatonin 3 MG TABS tablet, Take 3 mg by mouth at bedtime., Disp: , Rfl:    midodrine (PROAMATINE) 5 MG tablet, Take 5 mg by mouth. Monday, Wednesday,  Friday prior to dialysis, Disp: , Rfl:    ondansetron (ZOFRAN) 4 MG tablet, Take 4 mg by mouth every Monday, Wednesday, and Friday., Disp: , Rfl:    oxyCODONE (OXY IR/ROXICODONE) 5 MG immediate release tablet, Take 5 mg by mouth every 6 (six) hours., Disp: , Rfl:    pantoprazole (PROTONIX) 40 MG tablet, Take 1 tablet (40 mg total) by mouth 2 (two) times daily. (Patient taking differently: Take 40 mg by mouth daily.), Disp: 60 tablet, Rfl: 0   Polyethylene Glycol 3350 POWD, Take 1 Capful by mouth daily. 17 g, Disp: , Rfl:    pramoxine (SARNA SENSITIVE) 1 % LOTN, Apply 1 Application topically 2 (two) times daily., Disp: , Rfl:    Psyllium (REGULOID PO), Take 0.4 g/1.7m2 by mouth 2 (two) times daily., Disp: , Rfl:    saccharomyces boulardii (FLORASTOR) 250 MG capsule, Take 250 mg by mouth daily., Disp: , Rfl:    senna (SENOKOT) 8.6 MG TABS tablet, Take 1 tablet by mouth at bedtime., Disp: , Rfl:    senna-docusate (SENOKOT-S) 8.6-50 MG tablet, Take 1 tablet by mouth 2 (two) times daily as needed for mild constipation. (Patient taking differently: Take 1 tablet by mouth 2 (two) times daily.), Disp: , Rfl:    topiramate (TOPAMAX) 50 MG tablet, Take 1 tablet (50 mg total) by mouth 2 (two) times daily as needed (headache). (Patient taking  differently: Take 50 mg by mouth 2 (two) times daily.), Disp: , Rfl:    traZODone (DESYREL) 50 MG tablet, Take 1 tablet (50 mg total) by mouth every evening. (Patient taking differently: Take 50 mg by mouth at bedtime.), Disp: 30 tablet, Rfl: 0  Imaging Review  Cervical Imaging: Cervical CT wo contrast: Results for orders placed during the hospital encounter of 06/12/22 CT Cervical Spine Wo Contrast  Narrative CLINICAL DATA:  Ataxia, hypotension and dizziness  EXAM: CT HEAD WITHOUT CONTRAST  CT CERVICAL SPINE WITHOUT CONTRAST  TECHNIQUE: Multidetector CT imaging of the head and cervical spine was performed following the standard protocol without  intravenous contrast. Multiplanar CT image reconstructions of the cervical spine were also generated.  RADIATION DOSE REDUCTION: This exam was performed according to the departmental dose-optimization program which includes automated exposure control, adjustment of the mA and/or kV according to patient size and/or use of iterative reconstruction technique.  COMPARISON:  03/07/2021  FINDINGS: CT HEAD FINDINGS  Brain: No evidence of acute infarction, hemorrhage, hydrocephalus, extra-axial collection or mass lesion/mass effect. There is mild diffuse low-attenuation within the subcortical and periventricular white matter compatible with chronic microvascular disease.  Vascular: No hyperdense vessel or unexpected calcification.  Skull: Normal. Negative for fracture or focal lesion.  Sinuses/Orbits: Paranasal sinuses and mastoid air cells are clear.  Other: None  CT CERVICAL SPINE FINDINGS  Alignment: Normal slight reversal wall of normal cervical lordosis. No acute posttraumatic malalignment of the cervical spine.  Skull base and vertebrae: No acute fracture. No primary bone lesion or focal pathologic process.  Soft tissues and spinal canal: No prevertebral fluid or swelling. No visible canal hematoma.  Disc levels: There is degenerative disc disease identified the C5-6 level. New Schmorl's node deformity is identified involving the superior endplates of C6 and C7.  Upper chest: Negative.  Other: None  IMPRESSION: 1. No acute intracranial abnormality. 2. Chronic microvascular disease. 3. No evidence for cervical spine fracture or subluxation. 4. Cervical degenerative disc disease.   Electronically Signed By: Kimberley Penman M.D. On: 06/12/2022 12:15  Shoulder Imaging: Shoulder-L CT wo contrast: Results for orders placed during the hospital encounter of 09/30/21 CT SHOULDER LEFT WO CONTRAST  Narrative CLINICAL DATA:  Clinical suspicion for septic arthritis  involving the left shoulder joint.  EXAM: CT OF THE UPPER LEFT EXTREMITY WITHOUT CONTRAST  TECHNIQUE: Multidetector CT imaging of the upper left extremity was performed according to the standard protocol.  RADIATION DOSE REDUCTION: This exam was performed according to the departmental dose-optimization program which includes automated exposure control, adjustment of the mA and/or kV according to patient size and/or use of iterative reconstruction technique.  COMPARISON:  None Available.  FINDINGS: The glenohumeral joint is maintained. I do not see any destructive bony changes to suggest septic arthritis. No obvious joint effusion or inflammatory changes involving the shoulder musculature. There is some subcutaneous inflammatory change along the posterior deltoid muscle without discrete fluid collection or hematoma.  The [facility] joint is intact. Mild degenerative changes. The acromion is flat or type 1 in shape. There is undersurface spurring change.  Grossly by CT the rotator cuff tendons are intact. There is calcific tendinopathy the involving the supraspinatus attachment site.  The visualized left ribs are intact and the visualized left lung is grossly clear.  IMPRESSION: 1. No CT findings to suggest septic arthritis or osteomyelitis. 2. Mild subcutaneous inflammatory change along the posterior deltoid muscle without discrete fluid collection or hematoma. 3. Grossly by CT the rotator cuff tendons  are intact. There is calcific tendinopathy involving the supraspinatus attachment site.   Electronically Signed By: Rudie Meyer M.D. On: 10/26/2021 20:25  Lumbosacral Imaging: Lumbar CT wo contrast: Results for orders placed during the hospital encounter of 06/28/21 CT Lumbar Spine Wo Contrast  Narrative CLINICAL DATA:  68 year old female dialysis patient with low back pain, back spasm. Symptoms increasing for 4-5 days. No known injury.  EXAM: CT LUMBAR SPINE WITHOUT  CONTRAST  TECHNIQUE: Multidetector CT imaging of the lumbar spine was performed without intravenous contrast administration. Multiplanar CT image reconstructions were also generated.  RADIATION DOSE REDUCTION: This exam was performed according to the departmental dose-optimization program which includes automated exposure control, adjustment of the mA and/or kV according to patient size and/or use of iterative reconstruction technique.  COMPARISON:  CT Abdomen and Pelvis 12/12/2020.  FINDINGS: Segmentation: Normal.  Alignment: Stable lumbar lordosis. No significant spondylolisthesis.  Vertebrae: Chronic osteopenia, although may have progressed since last year. Visible lower thoracic levels are intact. Chronic benign L2 vertebral body hemangioma is stable. Lumbar levels appear stable and intact. Intact visible sacrum and SI joints. No acute osseous abnormality identified.  Paraspinal and other soft tissues: Widespread severe calcified atherosclerosis. Vascular patency is not evaluated in the absence of IV contrast. Normal caliber abdominal aorta. No lymphadenopathy in the visible abdomen. Chronic right pleural effusion partially visible at the right costophrenic angle. Diverticulosis of the large bowel. Negative visible other noncontrast abdominal viscera.  Lumbar paraspinal soft tissues are within normal limits.  Disc levels:  Fairly capacious spinal canal, and generally mild for age lumbar spine degeneration which appears stable from the CT last year. No CT evidence of lumbar spinal stenosis. And mild if any associated neural foraminal stenosis.  IMPRESSION: 1. Osteopenia.  No acute osseous abnormality in the lumbar spine.  2. 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  3. Aortic Atherosclerosis (ICD10-I70.0). Chronic right pleural effusion. Large bowel diverticulosis.   Electronically Signed By:  Odessa Fleming M.D. On: 06/28/2021 10:52  Lumbar DG 2-3 views: Results for orders placed during the hospital encounter of 03/06/21 DG Lumbar Spine 2-3 Views  Narrative CLINICAL DATA:  Back pain  EXAM: LUMBAR SPINE - 2-3 VIEW  COMPARISON:  CT abdomen pelvis, 12/12/2020  FINDINGS: No fracture or dislocation of the lumbar spine. Mild multilevel disc space height loss and osteophytosis. Mild multilevel facet degenerative change. Nonobstructive pattern of overlying bowel gas.  IMPRESSION: No fracture or dislocation of the lumbar spine. Mild multilevel disc space height loss and osteophytosis. Mild multilevel facet degenerative change.   Electronically Signed By: Jearld Lesch M.D. On: 03/06/2021 12:53  Wrist Imaging: Wrist-L DG Complete: Results for orders placed during the hospital encounter of 09/30/21 DG Wrist Complete Left  Narrative CLINICAL DATA:  Left wrist pain and swelling. Dog bite several days ago.  EXAM: LEFT WRIST - COMPLETE 3+ VIEW  COMPARISON:  None Available.  FINDINGS: There is no evidence of fracture or dislocation. There is no evidence of arthropathy or other focal bone abnormality. Extensive peripheral vascular calcification noted. No radiopaque foreign body identified.  IMPRESSION: No acute findings.   Electronically Signed By: Danae Orleans M.D. On: 09/30/2021 16:23  Hand Imaging: Hand-L DG Complete: Results for orders placed during the hospital encounter of 09/23/21 DG Hand Complete Left  Narrative CLINICAL DATA:  Left hand swelling, pain  EXAM: LEFT HAND - COMPLETE 3+ VIEW  COMPARISON:  None Available.  FINDINGS: Degenerative changes  in the wrist. No acute bony abnormality. Specifically, no fracture, subluxation, or dislocation.  IMPRESSION: No acute bony abnormality.   Electronically Signed By: Charlett Nose M.D. On: 09/23/2021 22:35  Complexity Note: Imaging results reviewed.                         Allergies  Ms. Trotti  is allergic to penicillins.  Laboratory Chemistry Profile   Renal Lab Results  Component Value Date   BUN 44 (H) 08/19/2023   CREATININE 2.55 (H) 08/19/2023   LABCREA 92 05/09/2020   BCR 18 08/06/2023   GFRAA  01/19/2021    SPECIMEN HEMOLYZED. HEMOLYSIS MAY AFFECT INTEGRITY OF RESULTS.   GFRNONAA 20 (L) 08/19/2023   PROTEINUR 100 (A) 12/11/2021     Electrolytes Lab Results  Component Value Date   NA 136 08/19/2023   K 4.4 08/19/2023   CL 96 (L) 08/19/2023   CALCIUM 8.3 (L) 08/19/2023   MG 1.8 06/16/2022   PHOS 4.0 06/16/2022     Hepatic Lab Results  Component Value Date   AST 11 (L) 06/12/2022   ALT 9 06/12/2022   ALBUMIN 2.8 (L) 06/13/2022   ALKPHOS 104 06/12/2022   LIPASE 37 06/12/2022     ID Lab Results  Component Value Date   HIV Non Reactive 08/15/2023   SARSCOV2NAA NEGATIVE 06/12/2022   MRSAPCR NEGATIVE 05/16/2020   HCVAB NON REACTIVE 11/04/2021     Bone Lab Results  Component Value Date   VD25OH 11.18 (L) 03/07/2021     Endocrine Lab Results  Component Value Date   GLUCOSE 100 (H) 08/19/2023   GLUCOSEU NEGATIVE 12/11/2021   HGBA1C 4.7 (L) 09/23/2021   TSH 0.549 03/07/2021     Neuropathy Lab Results  Component Value Date   VITAMINB12 655 03/07/2021   FOLATE 21.2 05/18/2020   HGBA1C 4.7 (L) 09/23/2021   HIV Non Reactive 08/15/2023     CNS No results found for: "COLORCSF", "APPEARCSF", "RBCCOUNTCSF", "WBCCSF", "POLYSCSF", "LYMPHSCSF", "EOSCSF", "PROTEINCSF", "GLUCCSF", "JCVIRUS", "CSFOLI", "IGGCSF", "LABACHR", "ACETBL"   Inflammation (CRP: Acute  ESR: Chronic) Lab Results  Component Value Date   CRP 0.7 12/11/2021   ESRSEDRATE 46 (H) 12/11/2021   LATICACIDVEN 0.8 06/12/2022     Rheumatology Lab Results  Component Value Date   ANA Negative 05/04/2020     Coagulation Lab Results  Component Value Date   INR 1.1 12/11/2021   LABPROT 14.2 12/11/2021   APTT 48 (H) 12/11/2021   PLT 130 (L) 08/19/2023   DDIMER 5.57 (H) 12/12/2020      Cardiovascular Lab Results  Component Value Date   BNP 83.8 03/07/2021   CKTOTAL 160 12/12/2020   HGB 11.2 (L) 08/19/2023   HCT 35.0 (L) 08/19/2023     Screening Lab Results  Component Value Date   SARSCOV2NAA NEGATIVE 06/12/2022   MRSAPCR NEGATIVE 05/16/2020   HCVAB NON REACTIVE 11/04/2021   HIV Non Reactive 08/15/2023     Cancer No results found for: "CEA", "CA125", "LABCA2"   Allergens No results found for: "ALMOND", "APPLE", "ASPARAGUS", "AVOCADO", "BANANA", "BARLEY", "BASIL", "BAYLEAF", "GREENBEAN", "LIMABEAN", "WHITEBEAN", "BEEFIGE", "REDBEET", "BLUEBERRY", "BROCCOLI", "CABBAGE", "MELON", "CARROT", "CASEIN", "CASHEWNUT", "CAULIFLOWER", "CELERY"     Note: Lab results reviewed.     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 I apologize for any typographical errors that were not detected and corrected.) Date: 08/26/2023; Time: 9:43 AM

## 2023-09-03 NOTE — Progress Notes (Deleted)
 Patient ID: Katie [name] MRN: [id number] DOB/AGE: Jan 02, 1956 68 y.o.  Primary Care Physician:[facility] Primary Cardiologist: [facility]  CC:  Aortic valvular disease management     FOCUSED PROBLEM LIST:   Aortic stenosis AVA 0.87, MG 36.5, V-max 4 7, EF 60 to 65% TTE February 2025 Invasive MG 40 to 50 mmHg cath April 2025 EKG sinus rhythm with LVH and no bundle-branch blocks CAD Moderate; cath April 2025 End-stage renal disease  April 2025:  Patient consents to use of AI scribe.*** Patient is a 68 year old female with above listed medical problems referred for recommendations regarding her severe aortic stenosis.  The patient was seen by Dr. Junnie [name] initially in January.  At that point in time he referred her for an echocardiogram.  This demonstrated severe aortic stenosis.  The patient was referred for coronary angiography which demonstrated moderate disease of the LAD and diagonal system.  Her invasive mean gradient across the aortic valve was above 40 mmHg.  She is referred for further recommendations regarding her severe aortic stenosis.         Past Medical History:  Diagnosis Date   Acute diastolic heart failure (HCC)    Chronic diastolic heart failure (HCC)    COPD (chronic obstructive pulmonary disease) (HCC)    Hypertension    Renal disorder     Past Surgical History:  Procedure Laterality Date   A/V SHUNT INTERVENTION Left 12/21/2021   Procedure: A/V SHUNT INTERVENTION;  Surgeon: Celso College, MD;  Location: [facility];  Service: Cardiovascular;  Laterality: Left;   APPENDECTOMY     AV FISTULA PLACEMENT Left 03/16/2021   Procedure: INSERTION OF ARTERIOVENOUS (AV) GORE-TEX GRAFT ARM;  Surgeon: Celso College, MD;  Location: [facility];  Service: Vascular;  Laterality: Left;   DIALYSIS/PERMA CATHETER INSERTION N/A 05/16/2020   Procedure: DIALYSIS/PERMA CATHETER INSERTION;  Surgeon: Celso College, MD;  Location: [facility];   Service: Cardiovascular;  Laterality: N/A;   DIALYSIS/PERMA CATHETER REMOVAL N/A 03/26/2022   Procedure: DIALYSIS/PERMA CATHETER REMOVAL;  Surgeon: Celso College, MD;  Location: [facility];  Service: Cardiovascular;  Laterality: N/A;   DIALYSIS/PERMA CATHETER REPAIR N/A 01/22/2022   Procedure: DIALYSIS/PERMA CATHETER INSERT;  Surgeon: Celso College, MD;  Location: [facility];  Service: Cardiovascular;  Laterality: N/A;   MANDIBLE SURGERY     RENAL BIOPSY Right    RIGHT HEART CATH N/A 05/10/2020   Procedure: RIGHT HEART CATH;  Surgeon: Sammy Crisp, MD;  Location: [facility];  Service: Cardiovascular;  Laterality: N/A;   RIGHT/LEFT HEART CATH AND CORONARY ANGIOGRAPHY Bilateral 08/15/2023   Procedure: RIGHT/LEFT HEART CATH AND CORONARY ANGIOGRAPHY;  Surgeon: Arleen Lacer, MD;  Location: [facility];  Service: Cardiovascular;  Laterality: Bilateral;   TEMPORARY DIALYSIS CATHETER N/A 05/11/2020   Procedure: TEMPORARY DIALYSIS CATHETER;  Surgeon: Celso College, MD;  Location: [facility];  Service: Cardiovascular;  Laterality: N/A;    Family History  Problem Relation Age of Onset   Heart attack Mother    Asthma Mother    Uterine cancer Mother    Skin cancer Father    Prostate cancer Father    CAD Father    Parkinson's disease Father    Lung cancer Brother     Social History   Socioeconomic History   Marital status: Single    Spouse name: Not on file   Number of children: Not on file   Years  of education: Not on file   Highest education level: Not on file  Occupational History   Not on file  Tobacco Use   Smoking status: Former    Types: Cigarettes   Smokeless tobacco: Never   Tobacco comments:    Quit over 15 years ago  Vaping Use   Vaping status: Never Used  Substance and Sexual Activity   Alcohol  use: Not Currently   Drug use: Never   Sexual activity: Not on file  Other Topics Concern   Not on file  Social History  Narrative   Not on file   Social Drivers of Health   Financial Resource Strain: Not on file  Food Insecurity: Patient Declined (08/15/2023)   Hunger Vital Sign    Worried About Running Out of Food in the Last Year: Patient declined    Ran Out of Food in the Last Year: Patient declined  Transportation Needs: Unknown (08/15/2023)   PRAPARE - Administrator, Civil Service (Medical): Patient declined    Lack of Transportation (Non-Medical): Not on file  Physical Activity: Not on file  Stress: Not on file  Social Connections: Patient Declined (08/15/2023)   Social Connection and Isolation Panel [NHANES]    Frequency of Communication with Friends and Family: Patient declined    Frequency of Social Gatherings with Friends and Family: Patient declined    Attends Religious Services: Patient declined    Database administrator or Organizations: Patient declined    Attends Banker Meetings: Patient declined    Marital Status: Patient declined  Intimate Partner Violence: Unknown (08/15/2023)   Humiliation, Afraid, Rape, and Kick questionnaire    Fear of Current or Ex-Partner: Patient declined    Emotionally Abused: Not on file    Physically Abused: Not on file    Sexually Abused: Not on file     Prior to Admission medications   Medication Sig Start Date End Date Taking? Authorizing Provider  acetaminophen  (TYLENOL ) 500 MG tablet Take 500 mg by mouth 3 (three) times daily. Take additional 500 mg every 6 hours as needed for pain    [provider]  ADVAIR DISKUS 250-50 MCG/ACT AEPB Inhale 1 puff into the lungs 2 (two) times daily. 05/21/23   [provider]  albuterol  (VENTOLIN  HFA) 108 (90 Base) MCG/ACT inhaler Inhale 1 puff into the lungs every 6 (six) hours as needed for wheezing. 01/29/22   [provider]  aspirin  EC 81 MG tablet Take 1 tablet (81 mg total) by mouth daily. Swallow whole. 06/03/23   Constancia Delton, MD  calcium  carbonate (TUMS -  DOSED IN MG ELEMENTAL CALCIUM ) 500 MG chewable tablet Chew 1 tablet by mouth 3 (three) times daily as needed (GRED).    [provider]  DULoxetine  (CYMBALTA ) 20 MG capsule Take 20 mg by mouth daily. 05/06/23   [provider]  famotidine  (PEPCID ) 20 MG tablet Take 20 mg by mouth at bedtime.    [provider]  guaifenesin  (ROBITUSSIN) 100 MG/5ML syrup Take 200 mg by mouth every 4 (four) hours as needed for cough.    [provider]  melatonin 3 MG TABS tablet Take 3 mg by mouth at bedtime.    [provider]  midodrine  (PROAMATINE ) 5 MG tablet Take 5 mg by mouth. Monday, Wednesday, Friday prior to dialysis 05/06/23   [provider]  ondansetron  (ZOFRAN ) 4 MG tablet Take 4 mg by mouth every Monday, Wednesday, and Friday. 04/27/23  [provider]  oxyCODONE  (OXY IR/ROXICODONE ) 5 MG immediate release tablet Take 5 mg by mouth every 6 (six) hours.    [provider]  pantoprazole  (PROTONIX ) 40 MG tablet Take 1 tablet (40 mg total) by mouth 2 (two) times daily. Patient taking differently: Take 40 mg by mouth daily. 11/22/21 08/15/23  Vada Garibaldi, MD  Polyethylene Glycol 3350  POWD Take 1 Capful by mouth daily. 17 g    [provider]  pramoxine (SARNA SENSITIVE) 1 % LOTN Apply 1 Application topically 2 (two) times daily.    [provider]  Psyllium (REGULOID PO) Take 0.4 g/1.7m2 by mouth 2 (two) times daily.    [provider]  saccharomyces boulardii (FLORASTOR) 250 MG capsule Take 250 mg by mouth daily.    [provider]  senna (SENOKOT) 8.6 MG TABS tablet Take 1 tablet by mouth at bedtime.    [provider]  senna-docusate (SENOKOT-S) 8.6-50 MG tablet Take 1 tablet by mouth 2 (two) times daily as needed for mild constipation. Patient taking differently: Take 1 tablet by mouth 2 (two) times daily. 12/25/21   Montey Apa, DO  topiramate  (TOPAMAX ) 50 MG tablet Take 1  tablet (50 mg total) by mouth 2 (two) times daily as needed (headache). Patient taking differently: Take 50 mg by mouth 2 (two) times daily. 03/21/21   Tiajuana Fluke, MD  traZODone  (DESYREL ) 50 MG tablet Take 1 tablet (50 mg total) by mouth every evening. Patient taking differently: Take 50 mg by mouth at bedtime. 07/02/20 08/15/23  Garnet Just, MD    Allergies  Allergen Reactions   Penicillins Nausea And Vomiting    Pt states "only pills" make her vomit Other reaction(s): Nausea/Vomit    REVIEW OF SYSTEMS:  General: no fevers/chills/night sweats Eyes: no blurry vision, diplopia, or amaurosis ENT: no sore throat or hearing loss Resp: no cough, wheezing, or hemoptysis CV: no edema or palpitations GI: no abdominal pain, nausea, vomiting, diarrhea, or constipation GU: no dysuria, frequency, or hematuria Skin: no rash Neuro: no headache, numbness, tingling, or weakness of extremities Musculoskeletal: no joint pain or swelling Heme: no bleeding, DVT, or easy bruising Endo: no polydipsia or polyuria  There were no vitals taken for this visit.  PHYSICAL EXAM: GEN:  AO x 3 in no acute distress HEENT: normal Dentition: Normal*** Neck: JVP normal. +2***carotid upstrokes without bruits. No thyromegaly. Lungs: equal expansion, clear bilaterally CV: Apex is discrete and nondisplaced, RRR without murmur or gallop*** Abd: soft, non-tender, non-distended; no bruit; positive bowel sounds Ext: no edema, ecchymoses, or cyanosis Vascular: 2+ femoral pulses, 2+ radial pulses       Skin: warm and dry without rash Neuro: CN II-XII grossly intact; motor and sensory grossly intact    DATA AND STUDIES:  EKG: April 2025 EKG with LVH without bundle-branch blocks  EKG Interpretation Date/Time:    Ventricular Rate:    PR Interval:    QRS Duration:    QT Interval:    QTC Calculation:   R Axis:      Text Interpretation:          Cardiac Studies & Procedures    ______________________________________________________________________________________________ CARDIAC CATHETERIZATION  CARDIAC CATHETERIZATION 08/15/2023  Conclusion Images from the original result were not included.    Very Small Caliber Diagonal Branches: 1st Diag lesion is 90% stenosed. 2nd Diag lesion is 80% stenosed.   Mid LAD lesion is 55% stenosed.   LV end diastolic pressure is normal.   There is  severe aortic valve stenosis.   Anticipated discharge date to be determined.   Plan per discussion with primary cardiology team and nephrologist would be to have the patient be admitted under the hospitalist service.  She will need to have dialysis done while she is here and have titration of medications.   Recommend Aspirin  81mg  daily for moderate CAD.  Dominance: Codominant  POST-OPERATIVE DIAGNOSIS: Severe heavily calcified aortic stenosis with mean gradient by cardiac catheterization of 40 to 50 mmHg (peak to peak gradient 59 mmHg) Single-vessel disease with a 60% LAD stenosis just after tandem very small caliber D1 and D2 (ostium of D1-D2 a roughly 90 and 80%); large codominant LCx with relatively no significant disease; moderate caliber codominant RCA with minimal disease in the PDA. RHC hemodynamics: RAP 1 mmHg, RV P-EDP 44/1-2 mmHg; PAP-mean 44/12-24 mmHg, PCWP mean 5 mmHg.;  Fick Cardiac Output-Index 6.41-3.87 with ao sat 93% and PA sat 70%. LV P-EDP 191/1-23 mmHg; AO P-MAP 140/61-95 mmHg Aortic AoV gradient P-P59 0.5 mmHg; mean AoV gradient estimated 45 to 50 mmHg   PT TO BE ADMITTED PER NEPHROLOGIST, DR LATEEF Plan per discussion with primary cardiology team and nephrologist would be to have the patient be admitted under the hospitalist service.  She will need to have dialysis done while she is here and have titration of medications.   Recommend Aspirin  81mg  daily for moderate CAD.   Will need outpatient structural heart team    Randene Bustard, MD  Findings Coronary  Findings Diagnostic  Dominance: Right  Left Anterior Descending Vessel is large. The vessel exhibits minimal luminal irregularities. The vessel is moderately calcified. Proximal one third to half of vessel Mid LAD lesion is 55% stenosed. The lesion is distal to major branch, focal and concentric.  First Diagonal Branch Vessel is small in size. There is moderate disease in the vessel. 1st Diag lesion is 90% stenosed.  Second Diagonal Branch Vessel is small in size. There is mild disease in the vessel. 2nd Diag lesion is 80% stenosed.  Left Circumflex Vessel was injected. Vessel is large and very large. Vessel is angiographically normal. The vessel originates from a separate ostium. Essentially cloacal left main The main circumflex terminates as a large lateral bifurcating OM 2  First Obtuse Marginal Branch Vessel is small in size.  Third Obtuse Marginal Branch Vessel is small in size.  First Left Posterolateral Branch Vessel is small in size.  Left Posterior Atrioventricular Artery Vessel is small in size.  Right Coronary Artery Vessel was injected. Vessel is normal in caliber. Vessel is angiographically normal.  Right Posterior Descending Artery Vessel is small in size. There is mild disease in the vessel.  First Right Posterolateral Branch Vessel is small in size.  Intervention  No interventions have been documented.   CARDIAC CATHETERIZATION  CARDIAC CATHETERIZATION 05/10/2020  Conclusion Conclusions: 1. Mildly elevated left heart, right heart, and pulmonary artery pressures. 2. Normal to supranormal Fick cardiac output/index.  Recommendations: 1. Continue gentle diuresis. 2. Consider further workup of potential causes of high-output heart failure.  Sammy Crisp, MD [facility]     ECHOCARDIOGRAM  ECHOCARDIOGRAM COMPLETE 06/20/2023  Narrative ECHOCARDIOGRAM REPORT    Patient Name:   [facility] Date of Exam: 06/20/2023 Medical Rec #:   [id number]    Height:       66.0 in Accession #:    [id number]   Weight:       133.8 lb Date of Birth:  Apr 14, 1956   BSA:  1.686 m Patient Age:    67 years     BP:           108/56 mmHg Patient Gender: F            HR:           78 bpm. Exam Location:  [facility]  Procedure: 2D Echo, Cardiac Doppler and Color Doppler  Indications:    I35.0 Nonrheumatic aortic (valve) stenosis  History:        Patient has prior history of Echocardiogram examinations, most recent 06/16/2022. CHF, Aortic Valve Disease, Signs/Symptoms:Murmur and Hypertensive Heart Disease; Risk Factors:Hypertension, Diabetes and Former Smoker.  Sonographer:    Malena Scull RDMS, RVT, RDCS Referring Phys: [id number] [facility]   Sonographer Comments: Technically challenging study due to limited acoustic windows. IMPRESSIONS   1. Left ventricular ejection fraction, by estimation, is 60 to 65%. The left ventricle has normal function. The left ventricle has no regional wall motion abnormalities. There is moderate left ventricular hypertrophy. Left ventricular diastolic parameters are consistent with Grade I diastolic dysfunction (impaired relaxation). 2. Right ventricular systolic function is normal. The right ventricular size is normal. There is mildly elevated pulmonary artery systolic pressure. The estimated right ventricular systolic pressure is 40.5 mmHg. 3. Left atrial size was mildly dilated. 4. The mitral valve is normal in structure. No evidence of mitral valve regurgitation. No evidence of mitral stenosis. The mean mitral valve gradient is 4.0 mmHg. Moderate mitral annular calcification. 5. The aortic valve is calcified. There is severe calcifcation of the aortic valve. Aortic valve regurgitation is mild. Severe aortic valve stenosis. Aortic valve area, by VTI measures 0.87 cm. Aortic valve mean gradient measures 36.5 mmHg. Aortic valve Vmax measures 4.75 m/s. 6. There is borderline dilatation of the  ascending aorta, measuring 39 mm. 7. The inferior vena cava is normal in size with greater than 50% respiratory variability, suggesting right atrial pressure of 3 mmHg.  Comparison(s): Previous AV meas, peak PG, 29 mmHg mean PG.  FINDINGS Left Ventricle: Left ventricular ejection fraction, by estimation, is 60 to 65%. The left ventricle has normal function. The left ventricle has no regional wall motion abnormalities. The left ventricular internal cavity size was normal in size. There is moderate left ventricular hypertrophy. Left ventricular diastolic parameters are consistent with Grade I diastolic dysfunction (impaired relaxation).  Right Ventricle: The right ventricular size is normal. No increase in right ventricular wall thickness. Right ventricular systolic function is normal. There is mildly elevated pulmonary artery systolic pressure. The tricuspid regurgitant velocity is 2.98 m/s, and with an assumed right atrial pressure of 5 mmHg, the estimated right ventricular systolic pressure is 40.5 mmHg.  Left Atrium: Left atrial size was mildly dilated.  Right Atrium: Right atrial size was normal in size.  Pericardium: There is no evidence of pericardial effusion.  Mitral Valve: The mitral valve is normal in structure. There is mild calcification of the mitral valve leaflet(s). Moderate mitral annular calcification. No evidence of mitral valve regurgitation. No evidence of mitral valve stenosis. MV peak gradient, 10.9 mmHg. The mean mitral valve gradient is 4.0 mmHg.  Tricuspid Valve: The tricuspid valve is normal in structure. Tricuspid valve regurgitation is mild . No evidence of tricuspid stenosis.  Aortic Valve: The aortic valve is calcified. There is severe calcifcation of the aortic valve. Aortic valve regurgitation is mild. Severe aortic stenosis is present. Aortic valve mean gradient measures 36.5 mmHg. Aortic valve peak gradient measures 90.2 mmHg. Aortic valve area, by VTI  measures 0.87 cm.  Pulmonic Valve: The pulmonic valve was normal in structure. Pulmonic valve regurgitation is not visualized. No evidence of pulmonic stenosis.  Aorta: The aortic root is normal in size and structure. There is borderline dilatation of the ascending aorta, measuring 39 mm.  Venous: The inferior vena cava is normal in size with greater than 50% respiratory variability, suggesting right atrial pressure of 3 mmHg.  IAS/Shunts: No atrial level shunt detected by color flow Doppler.   LEFT VENTRICLE PLAX 2D LVIDd:         4.20 cm      Diastology LVIDs:         2.55 cm      LV e' medial:    5.00 cm/s LV PW:         0.80 cm      LV E/e' medial:  24.0 LV IVS:        1.70 cm      LV e' lateral:   5.66 cm/s LVOT diam:     1.90 cm      LV E/e' lateral: 21.2 LV SV:         83 LV SV Index:   49 LVOT Area:     2.84 cm  LV Volumes (MOD) LV vol d, MOD A4C: 114.0 ml LV vol s, MOD A4C: 30.6 ml LV SV MOD A4C:     114.0 ml  RIGHT VENTRICLE             IVC RV Basal diam:  3.30 cm     IVC diam: 1.00 cm RV S prime:     12.50 cm/s TAPSE (M-mode): 2.5 cm  LEFT ATRIUM             Index        RIGHT ATRIUM           Index LA diam:        3.30 cm 1.96 cm/m   RA Area:     20.90 cm LA Vol (A2C):   62.4 ml 37.02 ml/m  RA Volume:   63.80 ml  37.85 ml/m LA Vol (A4C):   78.0 ml 46.27 ml/m LA Biplane Vol: 70.7 ml 41.94 ml/m AORTIC VALVE                     PULMONIC VALVE AV Area (Vmax):    0.83 cm      PV Vmax:       0.88 m/s AV Area (Vmean):   0.92 cm      PV Peak grad:  3.1 mmHg AV Area (VTI):     0.87 cm AV Vmax:           475.00 cm/s AV Vmean:          298.000 cm/s AV VTI:            0.951 m AV Peak Grad:      90.2 mmHg AV Mean Grad:      36.5 mmHg LVOT Vmax:         139.50 cm/s LVOT Vmean:        96.325 cm/s LVOT VTI:          0.292 m LVOT/AV VTI ratio: 0.31  AORTA Ao Root diam: 3.20 cm Ao Asc diam:  3.90 cm Ao Arch diam: 2.5 cm  MITRAL VALVE                 TRICUSPID VALVE MV Area (PHT): 3.17 cm  TR Peak grad:   35.5 mmHg MV Area VTI:   1.59 cm     TR Vmax:        298.00 cm/s MV Peak grad:  10.9 mmHg MV Mean grad:  4.0 mmHg     SHUNTS MV Vmax:       1.65 m/s     Systemic VTI:  0.29 m MV Vmean:      96.3 cm/s    Systemic Diam: 1.90 cm MV Decel Time: 239 msec MV E velocity: 120.00 cm/s MV A velocity: 174.00 cm/s MV E/A ratio:  0.69  Belva Boyden MD Electronically signed by Belva Boyden MD Signature Date/Time: 06/20/2023/3:13:38 PM    Final          ______________________________________________________________________________________________      08/19/2023: BUN 44; Creatinine, Ser 2.55; Hemoglobin 11.2; Platelets 130; Potassium 4.4; Sodium 136   STS RISK CALCULATOR: Pending  NHYA CLASS: ***    ASSESSMENT AND PLAN:   1. Nonrheumatic aortic valve stenosis   2. Coronary artery disease involving native coronary artery of native heart without angina pectoris   3. End stage renal disease (HCC)     Aortic stenosis:*** Coronary artery disease: Moderate; continue medical therapy with aspirin  81 mg; will defer statin given end-stage renal disease. End-stage renal disease: Followed by nephrology.   I have personally reviewed the patients imaging data as summarized above.  I have reviewed the natural history of aortic stenosis with the patient and family members who are present today. We have discussed the limitations of medical therapy and the poor prognosis associated with symptomatic aortic stenosis. We have also reviewed potential treatment options, including palliative medical therapy, conventional surgical aortic valve replacement, and transcatheter aortic valve replacement. We discussed treatment options in the context of this patient's specific comorbid medical conditions.   All of the patient's questions were answered today. Will make further recommendations based on the results of studies outlined above.   I spent  *** minutes reviewing all clinical data during and prior to this visit including all relevant imaging studies, laboratories, clinical information from other health systems and prior notes from both Cardiology and other specialties, interviewing the patient, conducting a complete physical examination, and coordinating care in order to formulate a comprehensive and personalized evaluation and treatment plan.   Sopheap Basic K Nealie Mchatton, MD  09/03/2023 5:00 PM    [facility] [address] [place], [place], [place]  [address] Phone: [phone]; Fax: [phone]

## 2023-09-04 ENCOUNTER — Ambulatory Visit: Admitting: Medical

## 2023-09-05 ENCOUNTER — Ambulatory Visit: Attending: Internal Medicine | Admitting: Internal Medicine

## 2023-09-05 DIAGNOSIS — N186 End stage renal disease: Secondary | ICD-10-CM

## 2023-09-05 DIAGNOSIS — I2510 Atherosclerotic heart disease of native coronary artery without angina pectoris: Secondary | ICD-10-CM

## 2023-09-05 DIAGNOSIS — I350 Nonrheumatic aortic (valve) stenosis: Secondary | ICD-10-CM

## 2023-09-11 ENCOUNTER — Ambulatory Visit: Admitting: Pain Medicine

## 2023-09-17 ENCOUNTER — Ambulatory Visit: Attending: Medical | Admitting: Medical

## 2023-09-17 ENCOUNTER — Encounter: Payer: Self-pay | Admitting: Medical

## 2023-09-17 VITALS — BP 104/56 | HR 80 | Ht 66.0 in | Wt 129.0 lb

## 2023-09-17 DIAGNOSIS — I2510 Atherosclerotic heart disease of native coronary artery without angina pectoris: Secondary | ICD-10-CM | POA: Insufficient documentation

## 2023-09-17 DIAGNOSIS — I350 Nonrheumatic aortic (valve) stenosis: Secondary | ICD-10-CM | POA: Insufficient documentation

## 2023-09-17 DIAGNOSIS — I5032 Chronic diastolic (congestive) heart failure: Secondary | ICD-10-CM | POA: Diagnosis present

## 2023-09-17 DIAGNOSIS — N186 End stage renal disease: Secondary | ICD-10-CM | POA: Diagnosis present

## 2023-09-17 NOTE — Progress Notes (Signed)
 Cardiology Office Note:  .   Date:  09/17/2023  ID:  Servando Danger, DOB 11-Feb-1956, MRN [id number] PCP: Cole Daubs, [facility] Providers Cardiologist:  Constancia Delton, MD   History of Present Illness: .   Katie [name] is a 68 y.o. female with a hx of CAD (LAD, RCA, LCx calcifications on chest CT ), HFpEF, ESRD on HD MWF, former smoker x 30+ years, CVA presenting for cath follow-up   Echocardiogram obtained 06/2022 showed normal systolic function EF 60 to 65%, grade 2 diastolic dysfunction, moderate to severe aortic valve stenosis, aortic valve mean gradient 31, V-max 3.7 m, AVA 0.8 cm.  Outpatient follow-up with cardiology was recommended.   Chest CT 10/2021 three-vessel coronary calcification, aortic valve calcification.   The patient was last seen in January 2025 was overall stable from a cardiac perspective.  Repeat echo was ordered to evaluate aortic stenosis.  Echo showed EF 60 to 65%, no wall motion abnormality, moderate LVH, grade 1 diastolic dysfunction, severe AS.   The patient was seen 08/06/23 and was set up for cardiac cath. Cardiac cath showed severe healvily calcified aortic stenosis with mean gradient by cath 40-88mmHg, single vessel CAD with 60% LAD stenosis, minimal PDA disease. ASA was recommended. She was admitted post-cath for HD.   Today, the patient has been OK. She has SOB from COPD. She has rare chest pain that is very brief. She does not take midodrine  on dialysis days. Cath site is stable.   Studies Reviewed: Aaron Aas        R/L heart cath 08/15/23    Very Small Caliber Diagonal Branches: 1st Diag lesion is 90% stenosed. 2nd Diag lesion is 80% stenosed.   Mid LAD lesion is 55% stenosed.   LV end diastolic pressure is normal.   There is severe aortic valve stenosis.   Anticipated discharge date to be determined.   Plan per discussion with primary cardiology team and nephrologist would be to have the patient be admitted under the hospitalist  service.  She will need to have dialysis done while she is here and have titration of medications.   Recommend Aspirin  81mg  daily for moderate CAD.   Dominance: Codominant  POST-OPERATIVE DIAGNOSIS:   Severe heavily calcified aortic stenosis with mean gradient by cardiac catheterization of 40 to 50 mmHg (peak to peak gradient 59 mmHg) Single-vessel disease with a 60% LAD stenosis just after tandem very small caliber D1 and D2 (ostium of D1-D2 a roughly 90 and 80%); large codominant LCx with relatively no significant disease; moderate caliber codominant RCA with minimal disease in the PDA. RHC hemodynamics: RAP 1 mmHg, RV P-EDP 44/1-2 mmHg; PAP-mean 44/12-24 mmHg, PCWP mean 5 mmHg.;  Fick Cardiac Output-Index 6.41-3.87 with ao sat 93% and PA sat 70%. LV P-EDP 191/1-23 mmHg; AO P-MAP 140/61-95 mmHg Aortic AoV gradient P-P59 0.5 mmHg; mean AoV gradient estimated 45 to 50 mmHg     PT TO BE ADMITTED PER NEPHROLOGIST, DR LATEEF    Plan per discussion with primary cardiology team and nephrologist would be to have the patient be admitted under the hospitalist service.  She will need to have dialysis done while she is here and have titration of medications.   Recommend Aspirin  81mg  daily for moderate CAD.   Will need outpatient structural heart team  Echo 06/2023 1. Left ventricular ejection fraction, by estimation, is 60 to 65%. The  left ventricle has normal function. The left ventricle has no regional  wall motion abnormalities. There is  moderate left ventricular hypertrophy.  Left ventricular diastolic  parameters are consistent with Grade I diastolic dysfunction (impaired  relaxation).   2. Right ventricular systolic function is normal. The right ventricular  size is normal. There is mildly elevated pulmonary artery systolic  pressure. The estimated right ventricular systolic pressure is 40.5 mmHg.   3. Left atrial size was mildly dilated.   4. The mitral valve is normal in structure. No  evidence of mitral valve  regurgitation. No evidence of mitral stenosis. The mean mitral valve  gradient is 4.0 mmHg. Moderate mitral annular calcification.   5. The aortic valve is calcified. There is severe calcifcation of the  aortic valve. Aortic valve regurgitation is mild. Severe aortic valve  stenosis. Aortic valve area, by VTI measures 0.87 cm. Aortic valve mean  gradient measures 36.5 mmHg. Aortic  valve Vmax measures 4.75 m/s.   6. There is borderline dilatation of the ascending aorta, measuring 39  mm.   7. The inferior vena cava is normal in size with greater than 50%  respiratory variability, suggesting right atrial pressure of 3 mmHg.   Comparison(s): Previous AV meas, peak PG, 29 mmHg mean PG.    Echo 06/2022  1. Left ventricular ejection fraction, by estimation, is 60 to 65%. The  left ventricle has normal function. The left ventricle has no regional  wall motion abnormalities. There is severe concentric left ventricular  hypertrophy. Left ventricular diastolic   parameters are consistent with Grade III diastolic dysfunction  (restrictive).   2. Right ventricular systolic function is normal. The right ventricular  size is normal.   3. Left atrial size was mildly dilated.   4. Right atrial size was mildly dilated.   5. The mitral valve is degenerative. Mild mitral valve regurgitation. No  evidence of mitral stenosis. Moderate to severe mitral annular  calcification.   6. The aortic valve is calcified. Aortic valve regurgitation is mild.  Moderate to severe aortic valve stenosis.   7. The inferior vena cava is normal in size with greater than 50%  respiratory variability, suggesting right atrial pressure of 3 mmHg.   Conclusion(s)/Recommendation(s): Valvular findings as outlined below    RHC 04/2020 Conclusions: Mildly elevated left heart, right heart, and pulmonary artery pressures. Normal to supranormal Fick cardiac  output/index. Recommendations: Continue gentle diuresis. Consider further workup of potential causes of high-output heart failure.   Sammy Crisp, MD [facility]      Physical Exam:   VS:  BP (!) 104/56   Pulse 80   Ht 5\' 6"  (1.676 m)   Wt 129 lb (58.5 kg)   SpO2 96%   BMI 20.82 kg/m    Wt Readings from Last 3 Encounters:  09/17/23 129 lb (58.5 kg)  08/19/23 129 lb (58.5 kg)  08/16/23 121 lb 7.6 oz (55.1 kg)    GEN: Well nourished, well developed in no acute distress NECK: No JVD; No carotid bruits CARDIAC: RRR, + murmur, no rubs, gallops RESPIRATORY:  Clear to auscultation without rales, wheezing or rhonchi  ABDOMEN: Soft, non-tender, non-distended EXTREMITIES:  No edema; No deformity   ASSESSMENT AND PLAN: .    Severe AS Cath showed severe AS with mean gradient 40-67mmHg. Patient had appointment with structural heart team, but she did not show. We will reach back out to res-schedule, she may have had transportation issues.   HFpEF The patient appears euvolemic. Volume management per HD.   CAD Cath showed single-vessel disease with a 60% LAD stenosis after tandem  very small caliber D1 and D2, large codominant Lcx with relatively no significant disease, moderate caliber codominant RCA with minimal disease in the the PDA. She reports atypical chest pain. No further work-up at this time. Continue ASA 81mg  daily. She needs a repeat lipid panel.   ESRD She does HD on M,W,F.        Dispo: Follow-up on  Signed, Zamir Staples Rebekah Canada, PA-C

## 2023-09-17 NOTE — Patient Instructions (Signed)
 Medication Instructions:  Your physician recommends that you continue on your current medications as directed. Please refer to the Current Medication list given to you today.  *If you need a refill on your cardiac medications before your next appointment, please call your pharmacy*  Lab Work: None ordered today. If you have labs (blood work) drawn today and your tests are completely normal, you will receive your results only by: MyChart Message (if you have MyChart) OR A paper copy in the mail If you have any lab test that is abnormal or we need to change your treatment, we will call you to review the results.  Testing/Procedures: None ordered today.  Follow-Up: At [facility], you and your health needs are our priority.  As part of our continuing mission to provide you with exceptional heart care, our providers are all part of one team.  This team includes your primary Cardiologist (physician) and Advanced Practice Providers or APPs (Physician Assistants and Nurse Practitioners) who all work together to provide you with the care you need, when you need it.  Your next appointment:   4 month(s)  The format for your next appointment:   In Person  Provider:   Cadence Gennaro Khat, PA-C   We recommend signing up for the patient portal called "MyChart".  Sign up information is provided on this After Visit Summary.  MyChart is used to connect with patients for Virtual Visits (Telemedicine).  Patients are able to view lab/test results, encounter notes, upcoming appointments, etc.  Non-urgent messages can be sent to your provider as well.   To learn more about what you can do with MyChart, go to [email or web address].   Other Instructions You have been referred to our structural heart team for aortic stenosis. Someone will be reaching out to you in the near future to schedule this initial appointment.

## 2023-09-18 ENCOUNTER — Ambulatory Visit: Admitting: Anesthesiology

## 2023-09-24 ENCOUNTER — Ambulatory Visit: Admitting: Anesthesiology

## 2023-09-25 ENCOUNTER — Ambulatory Visit: Admitting: Anesthesiology

## 2023-10-14 ENCOUNTER — Other Ambulatory Visit: Payer: Self-pay | Admitting: Family Medicine

## 2023-10-14 DIAGNOSIS — Z1231 Encounter for screening mammogram for malignant neoplasm of breast: Secondary | ICD-10-CM

## 2023-10-22 ENCOUNTER — Ambulatory Visit: Admitting: Anesthesiology

## 2023-10-24 ENCOUNTER — Other Ambulatory Visit: Payer: Self-pay

## 2023-10-24 ENCOUNTER — Ambulatory Visit: Attending: Cardiovascular Disease | Admitting: Cardiovascular Disease

## 2023-10-24 ENCOUNTER — Encounter: Payer: Self-pay | Admitting: Cardiovascular Disease

## 2023-10-24 VITALS — BP 118/54 | HR 74 | Ht 66.0 in | Wt 127.8 lb

## 2023-10-24 DIAGNOSIS — I350 Nonrheumatic aortic (valve) stenosis: Secondary | ICD-10-CM

## 2023-10-24 NOTE — Progress Notes (Addendum)
 Pre Surgical Assessment: 5 M Walk Test  17M=16.56ft  5 Meter Walk Test- trial 1: 7.17 seconds 5 Meter Walk Test- trial 2: 6.52 seconds 5 Meter Walk Test- trial 3: 6.74 seconds 5 Meter Walk Test Average: 6.81 seconds  _________________________   Procedure Type: Isolated AVR Perioperative Outcome Estimate % Operative Mortality 5.76% Morbidity & Mortality 13.1% Stroke 1.78% Renal Failure NA Reoperation 5.89% Prolonged Ventilation 7.14% Deep Sternal Wound Infection 0.058% Long Hospital Stay (>14 days) 6.45% Short Hospital Stay (<6 days)* 30.3%

## 2023-10-24 NOTE — Patient Instructions (Signed)
 Medication Instructions:  No changes *If you need a refill on your cardiac medications before your next appointment, please call your pharmacy*  Lab Work: none If you have labs (blood work) drawn today and your tests are completely normal, you will receive your results only by: MyChart Message (if you have MyChart) OR A paper copy in the mail If you have any lab test that is abnormal or we need to change your treatment, we will call you to review the results.  Testing/Procedures: CT scans of chest, abdomen, pelvis and heart - instructions/information to come  Follow-Up: Per Structural Heart Team

## 2023-10-24 NOTE — Progress Notes (Signed)
 [facility] Consult Note  Chief Complaint  Patient presents with   Follow-up    Aortic stenosis   History of Present Illness: 68 yo female with history of CAD, chronic diastolic CHF, ESRD on HD (MWF), CVA, former tobacco abuse, COPD and severe aortic stenosis who is here today as a new consult, referred by Dr. Junnie Olives, for further discussion regarding her aortic stenosis and possible TAVR. Echo February 2025 with LVEF=60-65%. Severe aortic stenosis with mean gradient 36.5 mmHg, AVA 0.83 cm2, DI 0.31. Cardiac cath 08/15/23 with moderate mid LAD stenosis. Mean aortic valve gradient over 40 mmHg.   She tells me today that she has dyspnea with exertion, dizziness and chest pressure. She lives in [facility] in [place], [place]. She is disabled.  She has full dentures.   Primary Care Physician: Carlen Chasten, FNP Primary Cardiologist: Junnie Olives Referring Cardiologist: Junnie Olives  Past Medical History:  Diagnosis Date   Acute diastolic heart failure [facility])    Aortic stenosis    Chronic diastolic heart failure (HCC)    COPD (chronic obstructive pulmonary disease) (HCC)    Hypertension    Renal disorder     Past Surgical History:  Procedure Laterality Date   A/V SHUNT INTERVENTION Left 12/21/2021   Procedure: A/V SHUNT INTERVENTION;  Surgeon: Celso College, MD;  Location: [facility] INVASIVE CV LAB;  Service: Cardiovascular;  Laterality: Left;   APPENDECTOMY     AV FISTULA PLACEMENT Left 03/16/2021   Procedure: INSERTION OF ARTERIOVENOUS (AV) GORE-TEX GRAFT ARM;  Surgeon: Celso College, MD;  Location: [facility] ORS;  Service: Vascular;  Laterality: Left;   DIALYSIS/PERMA CATHETER INSERTION N/A 05/16/2020   Procedure: DIALYSIS/PERMA CATHETER INSERTION;  Surgeon: Celso College, MD;  Location: [facility] INV[facility] CV LAB;  Service: Cardiovascular;  Laterality: N/A;   DIALYSIS/PERMA CATHETER REMOVAL N/A 03/26/2022   Procedure: DIALYSIS/PERMA CATHETER REMOVAL;  Surgeon: Celso College, MD;  Location: [facility] INV[facility] CV [facility];  Service: Cardiovascular;  Laterality: N/A;   DIALYSIS/PERMA CATHETER REPAIR N/A 01/22/2022   Procedure: DIALYSIS/PERMA CATHETER INSERT;  Surgeon: Celso College, MD;  Location: [facility] CV [facility];  Service: Cardiovascular;  Laterality: N/A;   MANDIBLE SURGERY     RENAL BIOPSY Right    RIGHT HEART CATH N/A 05/10/2020   Procedure: RIGHT HEART CATH;  Surgeon: Sammy Crisp, MD;  Location: [facility] INV[facility] CV [facility];  Service: Cardiovascular;  Laterality: N/A;   RIGHT/LEFT HEART CATH AND CORONARY ANGIOGRAPHY Bilateral 08/15/2023   Procedure: RIGHT/LEFT HEART CATH AND CORONARY ANGIOGRAPHY;  Surgeon: Arleen Lacer, MD;  Location: [facility] INV[facility] CV LAB;  Service: Cardiovascular;  Laterality: Bilateral;   TEMPORARY DIALYSIS CATHETER N/A 05/11/2020   Procedure: TEMPORARY DIALYSIS CATHETER;  Surgeon: Celso College, MD;  Location: [facility];  Service: Cardiovascular;  Laterality: N/A;    Current Outpatient Medications  Medication Sig Dispense Refill   acetaminophen  (TYLENOL ) 500 MG tablet Take 500 mg by mouth 3 (three) times daily. Take additional 500 mg every 6 hours as needed for pain     aspirin  EC 81 MG tablet Take 1 tablet (81 mg total) by mouth daily. Swallow whole.     calcium  carbonate (TUMS - DOSED IN MG ELEMENTAL CALCIUM ) 500 MG chewable tablet Chew 1 tablet by mouth 3 (three) times daily as needed (GRED).     DULoxetine  (CYMBALTA ) 30 MG capsule Take 30 mg by mouth daily.     famotidine  (PEPCID ) 20 MG tablet Take 10 mg by  mouth at bedtime.     guaifenesin  (ROBITUSSIN) 100 MG/5ML syrup Take 200 mg by mouth every 4 (four) hours as needed for cough.     melatonin 3 MG TABS tablet Take 3 mg by mouth at bedtime.     ondansetron  (ZOFRAN ) 4 MG tablet Take 4 mg by mouth every Monday, Wednesday, and Friday.     oxyCODONE  (OXY IR/ROXICODONE ) 5 MG immediate release tablet Take 5 mg by mouth every 6 (six) hours.     pantoprazole  (PROTONIX ) 40 MG tablet Take 1  tablet (40 mg total) by mouth 2 (two) times daily. 60 tablet 0   Polyethylene Glycol 3350  POWD Take 1 Capful by mouth daily. 17 g     pramoxine (SARNA SENSITIVE) 1 % LOTN Apply 1 Application topically 2 (two) times daily.     Psyllium (REGULOID PO) Take 0.4 g/1.7m2 by mouth 2 (two) times daily.     senna (SENOKOT) 8.6 MG TABS tablet Take 1 tablet by mouth at bedtime.     senna-docusate (SENOKOT-S) 8.6-50 MG tablet Take 1 tablet by mouth 2 (two) times daily as needed for mild constipation.     topiramate  (TOPAMAX ) 50 MG tablet Take 1 tablet (50 mg total) by mouth 2 (two) times daily as needed (headache).     traZODone  (DESYREL ) 50 MG tablet Take 1 tablet (50 mg total) by mouth every evening. 30 tablet 0   ADVAIR DISKUS 250-50 MCG/ACT AEPB Inhale 1 puff into the lungs 2 (two) times daily.     albuterol  (VENTOLIN  HFA) 108 (90 Base) MCG/ACT inhaler Inhale 1 puff into the lungs every 6 (six) hours as needed for wheezing.     midodrine  (PROAMATINE ) 5 MG tablet Take 5 mg by mouth. Monday, Wednesday, Friday prior to dialysis     saccharomyces boulardii (FLORASTOR) 250 MG capsule Take 250 mg by mouth daily.     No current facility-administered medications for this visit.    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  Tobacco Use   Smoking status: Former    Types: Cigarettes   Smokeless tobacco: Never   Tobacco comments:    Quit over 15 years ago  Vaping Use   Vaping status: Never Used  Substance and Sexual Activity   Alcohol  use: Not Currently   Drug use: Never   Sexual activity: Not on file  Other Topics  Concern   Not on file  Social History Narrative   Not on file   Social Drivers of Health   Financial Resource Strain: Not on file  Food Insecurity: Patient Declined (08/15/2023)   Hunger Vital Sign    Worried About Running Out of Food in the Last Year: Patient declined    Ran Out of Food in the Last Year: Patient declined  Transportation Needs: Unknown (08/15/2023)   PRAPARE - Administrator, Civil Service (Medical): Patient declined    Lack of Transportation (Non-Medical): Not on file  Physical Activity: Not on file  Stress: Not on file  Social Connections: Patient Declined (08/15/2023)   Social Connection and  Isolation Panel    Frequency of Communication with Friends and Family: Patient declined    Frequency of Social Gatherings with Friends and Family: Patient declined    Attends Religious Services: Patient declined    Database administrator or Organizations: Patient declined    Attends Banker Meetings: Patient declined    Marital Status: Patient declined  Intimate Partner Violence: Unknown (08/15/2023)   Humiliation, Afraid, Rape, and Kick questionnaire    Fear of Current or Ex-Partner: Patient declined    Emotionally Abused: Not on file    Physically Abused: Not on file    Sexually Abused: Not on file    Family History  Problem Relation Age of Onset   Heart attack Mother    Asthma Mother    Uterine cancer Mother    Skin cancer Father    Prostate cancer Father    CAD Father    Parkinson's disease Father    Lung cancer Brother     Review of Systems:  As stated in the HPI and otherwise negative.   BP (!) 118/54 (BP Location: Right Arm, Patient Position: Sitting, Cuff Size: Normal)   Pulse 74   Ht 5' 6 (1.676 m)   Wt 127 lb 12.8 oz (58 kg)   SpO2 97%   BMI 20.63 kg/m   Physical Examination: General: Well developed, well nourished, NAD  HEENT: OP clear, mucus membranes moist  SKIN: warm, dry. No rashes. Neuro: No focal deficits   Musculoskeletal: Muscle strength 5/5 all ext  Psychiatric: Mood and affect normal  Neck: No JVD, no carotid bruits, no thyromegaly, no lymphadenopathy.  Lungs:Clear bilaterally, no wheezes, rhonci, crackles Cardiovascular: Regular rate and rhythm.  Loud, harsh, late peaking systolic murmur.  Abdomen:Soft. Bowel sounds present. Non-tender.  Extremities:  No lower extremity edema. Pulses are 2 + in the bilateral DP/PT.  EKG:  EKG is ordered today. The ekg ordered today demonstrates  EKG Interpretation Date/Time:  Thursday October 24 2023 11:32:57 EDT Ventricular Rate:  73 PR Interval:  176 QRS Duration:  84 QT Interval:  406 QTC Calculation: 447 R Axis:   -3  Text Interpretation: Normal sinus rhythm Possible Left atrial enlargement Left ventricular hypertrophy with repolarization abnormality ( R in aVL , Cornell product ) Confirmed by Antoinette Batman [phone]) on 10/24/2023 11:51:48 AM   Echo February 2025:  1. Left ventricular ejection fraction, by estimation, is 60 to 65%. The  left ventricle has normal function. The left ventricle has no regional  wall motion abnormalities. There is moderate left ventricular hypertrophy.  Left ventricular diastolic  parameters are consistent with Grade I diastolic dysfunction (impaired  relaxation).   2. Right ventricular systolic function is normal. The right ventricular  size is normal. There is mildly elevated pulmonary artery systolic  pressure. The estimated right ventricular systolic pressure is 40.5 mmHg.   3. Left atrial size was mildly dilated.   4. The mitral valve is normal in structure. No evidence of mitral valve  regurgitation. No evidence of mitral stenosis. The mean mitral valve  gradient is 4.0 mmHg. Moderate mitral annular calcification.   5. The aortic valve is calcified. There is severe calcifcation of the  aortic valve. Aortic valve regurgitation is mild. Severe aortic valve  stenosis. Aortic valve area, by VTI measures  0.87 cm. Aortic valve mean  gradient measures 36.5 mmHg. Aortic  valve Vmax measures 4.75 m/s.   6. There is borderline dilatation of the ascending aorta, measuring 39  mm.  7. The inferior vena cava is normal in size with greater than 50%  respiratory variability, suggesting right atrial pressure of 3 mmHg.   Comparison(s): Previous AV meas, peak PG, 29 mmHg mean PG.   FINDINGS   Left Ventricle: Left ventricular ejection fraction, by estimation, is 60  to 65%. The left ventricle has normal function. The left ventricle has no  regional wall motion abnormalities. The left ventricular internal cavity  size was normal in size. There is   moderate left ventricular hypertrophy. Left ventricular diastolic  parameters are consistent with Grade I diastolic dysfunction (impaired  relaxation).   Right Ventricle: The right ventricular size is normal. No increase in  right ventricular wall thickness. Right ventricular systolic function is  normal. There is mildly elevated pulmonary artery systolic pressure. The  tricuspid regurgitant velocity is 2.98   m/s, and with an assumed right atrial pressure of 5 mmHg, the estimated  right ventricular systolic pressure is 40.5 mmHg.   Left Atrium: Left atrial size was mildly dilated.   Right Atrium: Right atrial size was normal in size.   Pericardium: There is no evidence of pericardial effusion.   Mitral Valve: The mitral valve is normal in structure. There is mild  calcification of the mitral valve leaflet(s). Moderate mitral annular  calcification. No evidence of mitral valve regurgitation. No evidence of  mitral valve stenosis. MV peak gradient,  10.9 mmHg. The mean mitral valve gradient is 4.0 mmHg.   Tricuspid Valve: The tricuspid valve is normal in structure. Tricuspid  valve regurgitation is mild . No evidence of tricuspid stenosis.   Aortic Valve: The aortic valve is calcified. There is severe calcifcation  of the aortic valve.  Aortic valve regurgitation is mild. Severe aortic  stenosis is present. Aortic valve mean gradient measures 36.5 mmHg. Aortic  valve peak gradient measures 90.2  mmHg. Aortic valve area, by VTI measures 0.87 cm.   Pulmonic Valve: The pulmonic valve was normal in structure. Pulmonic valve  regurgitation is not visualized. No evidence of pulmonic stenosis.   Aorta: The aortic root is normal in size and structure. There is  borderline dilatation of the ascending aorta, measuring 39 mm.   Venous: The inferior vena cava is normal in size with greater than 50%  respiratory variability, suggesting right atrial pressure of 3 mmHg.   IAS/Shunts: No atrial level shunt detected by color flow Doppler.     LEFT VENTRICLE  PLAX 2D  LVIDd:         4.20 cm      Diastology  LVIDs:         2.55 cm      LV e' medial:    5.00 cm/s  LV PW:         0.80 cm      LV E/e' medial:  24.0  LV IVS:        1.70 cm      LV e' lateral:   5.66 cm/s  LVOT diam:     1.90 cm      LV E/e' lateral: 21.2  LV SV:         83  LV SV Index:   49  LVOT Area:     2.84 cm    LV Volumes (MOD)  LV vol d, MOD A4C: 114.0 ml  LV vol s, MOD A4C: 30.6 ml  LV SV MOD A4C:     114.0 ml   RIGHT VENTRICLE  IVC  RV Basal diam:  3.30 cm     IVC diam: 1.00 cm  RV S prime:     12.50 cm/s  TAPSE (M-mode): 2.5 cm   LEFT ATRIUM             Index        RIGHT ATRIUM           Index  LA diam:        3.30 cm 1.96 cm/m   RA Area:     20.90 cm  LA Vol (A2C):   62.4 ml 37.02 ml/m  RA Volume:   63.80 ml  37.85 ml/m  LA Vol (A4C):   78.0 ml 46.27 ml/m  LA Biplane Vol: 70.7 ml 41.94 ml/m   AORTIC VALVE                     PULMONIC VALVE  AV Area (Vmax):    0.83 cm      PV Vmax:       0.88 m/s  AV Area (Vmean):   0.92 cm      PV Peak grad:  3.1 mmHg  AV Area (VTI):     0.87 cm  AV Vmax:           475.00 cm/s  AV Vmean:          298.000 cm/s  AV VTI:            0.951 m  AV Peak Grad:      90.2 mmHg  AV Mean Grad:       36.5 mmHg  LVOT Vmax:         139.50 cm/s  LVOT Vmean:        96.325 cm/s  LVOT VTI:          0.292 m  LVOT/AV VTI ratio: 0.31    AORTA  Ao Root diam: 3.20 cm  Ao Asc diam:  3.90 cm  Ao Arch diam: 2.5 cm   MITRAL VALVE                TRICUSPID VALVE  MV Area (PHT): 3.17 cm     TR Peak grad:   35.5 mmHg  MV Area VTI:   1.59 cm     TR Vmax:        298.00 cm/s  MV Peak grad:  10.9 mmHg  MV Mean grad:  4.0 mmHg     SHUNTS  MV Vmax:       1.65 m/s     Systemic VTI:  0.29 m  MV Vmean:      96.3 cm/s    Systemic Diam: 1.90 cm  MV Decel Time: 239 msec  MV E velocity: 120.00 cm/s  MV A velocity: 174.00 cm/s  MV E/A ratio:  0.69   Cardiac cath 08/15/23:    Very Small Caliber Diagonal Branches: 1st Diag lesion is 90% stenosed. 2nd Diag lesion is 80% stenosed.   Mid LAD lesion is 55% stenosed.   LV end diastolic pressure is normal.   There is severe aortic valve stenosis.   Anticipated discharge date to be determined.   Plan per discussion with primary cardiology team and nephrologist would be to have the patient be admitted under the hospitalist service.  She will need to have dialysis done while she is here and have titration of medications.   Recommend Aspirin  81mg  daily for moderate CAD.   Dominance: Codominant  POST-OPERATIVE DIAGNOSIS:  Severe heavily calcified aortic stenosis with mean gradient by cardiac catheterization of 40 to 50 mmHg (peak to peak gradient 59 mmHg) Single-vessel disease with a 60% LAD stenosis just after tandem very small caliber D1 and D2 (ostium of D1-D2 a roughly 90 and 80%); large codominant LCx with relatively no significant disease; moderate caliber codominant RCA with minimal disease in the PDA. RHC hemodynamics: RAP 1 mmHg, RV P-EDP 44/1-2 mmHg; PAP-mean 44/12-24 mmHg, PCWP mean 5 mmHg.;  Fick Cardiac Output-Index 6.41-3.87 with ao sat 93% and PA sat 70%. LV P-EDP 191/1-23 mmHg; AO P-MAP 140/61-95 mmHg Aortic AoV gradient P-P59 0.5 mmHg; mean AoV  gradient estimated 45 to 50 mmHg     PT TO BE ADMITTED PER NEPHROLOGIST, DR LATEEF    Plan per discussion with primary cardiology team and nephrologist would be to have the patient be admitted under the hospitalist service.  She will need to have dialysis done while she is here and have titration of medications.   Recommend Aspirin  81mg  daily for moderate CAD.   Will need outpatient structural heart team       Randene Bustard, MD       Recommendations  Antiplatelet/Anticoag Recommend Aspirin  81mg  daily for moderate CAD.  Discharge Date Anticipated discharge date to be determined. Plan per discussion with primary cardiology team and nephrologist would be to have the patient be admitted under the hospitalist service.  She will need to have dialysis done while she is here and have titration of medications.   Surgeon Notes    08/15/2023  1:30 PM Brief Op Note signed by Arleen Lacer, MD   Indications  Coronary artery calcification seen on computed tomography [I25.10 (ICD-10-CM)]  Aortic valve stenosis, etiology of cardiac valve disease unspecified [I35.0 (ICD-10-CM)]   Clinical Presentation  CHF/Shock Congestive heart failure not present. No shock present.   Procedural Details  Technical Details PCP: [facility] Cardiologist:  Constancia Delton, MD; Cadence Gennaro Khat, [place]    PATIENT:  Katie [name]  68 y.o. female former (30 years) with a history of three-vessel coronary calcification noted on CT scan, HFpEF, ESRD on HD M-W-F with prior CVA with recently diagnosed severe aortic stenosis who is referred for right and left heart catheterization as part of preoperative evaluation for likely TAVR.  She has noted combination of exertional dyspnea with intermittent chest pains.  Has had issues with hypotension during dialysis.  She was seen by Toribio Frees, PA PA on 08/08/2023 to follow-up after echocardiogram and scheduled procedure.   PRE-OPERATIVE  DIAGNOSIS: Severe Aortic Stenosis; Coronary Artery Calcification on CT  PROCEDURE PERFORMED: Time Out: Verified patient identification, verified procedure, site/side was marked, verified correct patient position, special equipment/implants available, medications/allergies/relevent history reviewed, required imaging and test results available. Performed.   Access:  RIGHT Radial Artery: 6 Fr sheath -- Seldinger technique using glide sheath micropuncture kit   Direct ultrasound guidance used.  Permanent image obtained and placed on chart.  10 mL radial cocktail IA; 3000 units IV Heparin  RIGHT Brachial Vein: 5 French sheath-modified Seldinger technique-glide sheath micropuncture kit             Direct ultrasound guidance used.  Permanent image obtained and placed on chart.   Right Heart Catheterization: 5 Fr Harlo Ligas catheter advanced under fluoroscopy with balloon inflated to the RA, RV, then PCWP-PA for hemodynamic measurement. *Simultaneous FA & PA blood gases checked for SaO2% to calculate FICK CO/CI. Catheter removed completely out of the body with  balloon deflated.    Left Heart Catheterization: 5Fr catheters advanced or exchanged over a J-wire under direct fluoroscopic guidance into the ascending aorta; TIG 4.0 catheter advanced first.   * LV Hemodynamics (no LV Gram) & Left Coronary Artery Cineangiography: TIG 4.0 catheter; the valve was crossed using the standard J-wire with minimal difficulty, however gradient was significant.  * Right Coronary Artery Cineangiography: JR4 catheter    Upon completion of Angiogaphy, the catheter was removed completely out of the body over a wire, without complication.   Brachial Sheath(s) removed in the Cath Lab with manual pressure for hemostasis.    Radial sheath removed in the Cardiac Catheterization lab with TR Band placed for hemostasis.   TR Band: 1255 Hours; 12 mL air reverse Barbeau B   MEDICATIONS * SQ Lidocaine  3mL * Radial Cocktail:  3 mg Verapmil in 10 mL NS * Isovue Contrast: 50 mL * Heparin : 3000 units   ANESTHESIA:   local and IV sedation; 3 mL lidocaine  for both brachial and arterial access; 0.5 mg IV Versed , 25 mcg IV fentanyl  Sublimaze  for sedation   EBL: <20 mL   PATIENT DISPOSITION:  PACU - hemodynamically stable.           Estimated blood loss <50 mL.   During this procedure medications were administered to achieve and maintain moderate conscious sedation while the patient's heart rate, blood pressure, and oxygen saturation were continuously monitored and I was present face-to-face 100% of this time. Maryann McDaniel Cardiovascular Specialist and Katlyn Kiger Cardiovascular Specialist are independent, trained observers who assisted in the monitoring of the patient's level of consciousness.   Medications (Filter: Administrations occurring from 1201 to 1306 on 08/15/23) Heparin  (Porcine) in NaCl 2000-0.9 UNIT/L-% SOLN (mL)  Total volume: 1,000 mL Date/Time Rate/Dose/Volume Action   08/15/23 1202 1,000 mL Given   fentaNYL  (SUBLIMAZE ) injection (mcg)  Total dose: 25 mcg Date/Time Rate/Dose/Volume Action   08/15/23 1207 25 mcg Given   midazolam  (VERSED ) injection (mg)  Total dose: 0.5 mg Date/Time Rate/Dose/Volume Action   08/15/23 1208 0.5 mg Given   lidocaine  (PF) (XYLOCAINE ) 1 % injection (mL)  Total volume: 5 mL Date/Time Rate/Dose/Volume Action   08/15/23 1213 5 mL Given   verapamil  (ISOPTIN ) injection (mg)  Total dose: 2.5 mg Date/Time Rate/Dose/Volume Action   08/15/23 1222 2.5 mg Given   heparin  sodium (porcine) injection (Units)  Total dose: 3,000 Units Date/Time Rate/Dose/Volume Action   08/15/23 1231 3,000 Units Given   iohexol  (OMNIPAQUE ) 300 MG/ML solution (mL)  Total volume: 82 mL Date/Time Rate/Dose/Volume Action   08/15/23 1248 82 mL Given    Sedation Time  Sedation Time Physician-1: 42 minutes 43 seconds Contrast     Administrations occurring from 1201 to 1306 on 08/15/23:   Medication Name Total Dose  iohexol  (OMNIPAQUE ) 300 MG/ML solution 82 mL   Radiation/Fluoro  Fluoro time: 7.1 (min) DAP: 15.7 (Gycm2) Cumulative Air Kerma: 228 (mGy) Complications  Complications documented before study signed (08/15/2023  2:31 PM)   No complications were associated with this study.  Documented by Kiger, Katlyn, RT - 08/15/2023 12:49 PM     Coronary Findings  Diagnostic Dominance: Right Left Anterior Descending  Vessel is large. The vessel exhibits minimal luminal irregularities. The vessel is moderately calcified. Proximal one third to half of vessel  Mid LAD lesion is 55% stenosed. The lesion is distal to major branch, focal and concentric.    First Diagonal Branch  Vessel is small in size. There is moderate disease in  the vessel.  1st Diag lesion is 90% stenosed.    Second Diagonal Branch  Vessel is small in size. There is mild disease in the vessel.  2nd Diag lesion is 80% stenosed.    Left Circumflex  Vessel was injected. Vessel is large and very large. Vessel is angiographically normal. The vessel originates from a separate ostium. Essentially cloacal left main The main circumflex terminates as a large lateral bifurcating OM 2    First Obtuse Marginal Branch  Vessel is small in size.    Third Obtuse Marginal Branch  Vessel is small in size.    First Left Posterolateral Branch  Vessel is small in size.    Left Posterior Atrioventricular Artery  Vessel is small in size.    Right Coronary Artery  Vessel was injected. Vessel is normal in caliber. Vessel is angiographically normal.    Right Posterior Descending Artery  Vessel is small in size. There is mild disease in the vessel.    First Right Posterolateral Branch  Vessel is small in size.    Intervention   No interventions have been documented.   Right Heart  Right Heart Pressures PAP-mean 44/12-24 mmHg, PCWP mean 5 mmHg. LV EDP is normal. LV P-EDP 191/1-23 mmHg; AO P-MAP 140/61-95 mmHg  Ao sat 93% and PA sat 70%; Fick Cardiac Output-Index 6.41-3.87 :.  Right Atrium Right atrial pressure is decreased. RAP 1 mmHg  Right Ventricle RV P-EDP 44/1-2 mmHg   Wall Motion  No LV gram         Left Heart  Aortic Valve There is severe aortic valve stenosis. Aortic AoV gradient P-P59 0.5 mmHg; mean AoV gradient estimated 45 to 50 mmHg The aortic valve is calcified.   Coronary Diagrams  Diagnostic Dominance: Right  Intervention   Implants   No implant documentation for this case.   Syngo Images   Show images for CARDIAC CATHETERIZATION Images on Long Term Storage   Show images for Skarlett, Sedlacek to Procedure Log  Procedure Log    Hemodynamics  Pressures Phases Resting  Right     RA Mean  mmHg 1    RA A-Wave  mmHg 1    RV  mmHg 44/0  Pulmonary     PA  mmHg 53/48 (52)    PCW Mean  mmHg 4.0    PCW A-Wave  mmHg 8.0    PCW V-Wave  mmHg 5.0    PAPi   5.0    Hemo Data  Flowsheet Row Most Recent Value  Fick Cardiac Output 6.41 L/min  Fick Cardiac Output Index 3.87 (L/min)/BSA  Aortic Mean Gradient 49.96 mmHg  Aortic Peak Gradient 59.5 mmHg  Aortic Valve Area 0.79  Aortic Value Area Index 0.48 cm2/BSA  RA A Wave 1 mmHg  RA V Wave -1 mmHg  RA Mean -3 mmHg  RV Systolic Pressure 42 mmHg  RV Diastolic Pressure -10 mmHg  RV EDP -1 mmHg  PA Systolic Pressure 53 mmHg  PA Diastolic Pressure 48 mmHg  PA Mean 52 mmHg  PW A Wave 8 mmHg  PW V Wave 5 mmHg  PW Mean 4 mmHg  AO Systolic Pressure 128 mmHg  AO Diastolic Pressure 44 mmHg  AO Mean 79 mmHg  LV Systolic Pressure 197 mmHg  LV Diastolic Pressure -13 mmHg  LV EDP 6 mmHg  Arterial Occlusion Pressure Extended Systolic Pressure 145 mmHg  Arterial Occlusion Pressure Extended Diastolic Pressure 61 mmHg  Arterial Occlusion Pressure Extended Mean Pressure 95 mmHg  Left Ventricular Apex Extended Systolic Pressure 190 mmHg  LVp Diastolic Pressure 1 mmHg  Left Ventricular Apex Extended EDP Pressure 23  mmHg  QP/QS 1  TPVR Index 6.21 HRUI  TSVR Index 20.44 HRUI  TPVR/TSVR Ratio 0.3    Recent Labs: 08/19/2023: BUN 44; Creatinine, Ser 2.55; Hemoglobin 11.2; Platelets 130; Potassium 4.4; Sodium 136    Wt Readings from Last 3 Encounters:  10/24/23 127 lb 12.8 oz (58 kg)  09/17/23 129 lb (58.5 kg)  08/19/23 129 lb (58.5 kg)    Assessment and Plan:   1. Severe Aortic Valve Stenosis: She has severe, stage D aortic valve stenosis. She has NYHA class 3 symptoms. I have personally reviewed the echo images. The aortic valve is thickened and calcified with limited leaflet mobility. I think she would benefit from AVR. Given advanced age, COPD and ESRD, she is not a good candidate for conventional AVR by surgical approach. I think she may be a good candidate for TAVR.   I have reviewed the natural history of aortic stenosis with the patient and their family members  who are present today. We have discussed the limitations of medical therapy and the poor prognosis associated with symptomatic aortic stenosis. We have reviewed potential treatment options, including palliative medical therapy, conventional surgical aortic valve replacement, and transcatheter aortic valve replacement. We discussed treatment options in the context of the patient's specific comorbid medical conditions.   She would like to proceed with planning for TAVR. Risks and benefits of the valve procedure are reviewed with the patient. I will arrange a cardiac CT, CTA of the chest/abdomen and pelvis and she will then be referred to see one of the CT surgeons on our TAVR team.     Labs/ tests ordered today include:   Orders Placed This Encounter  Procedures   EKG 12-Lead   Disposition:   F/U will be arranged with the structural team  Signed, Antoinette Batman, MD, [facility] 10/24/2023 12:57 PM    [facility] [address] [place], [place], [place]  [address] Phone: [phone]; Fax: [phone]

## 2023-10-30 ENCOUNTER — Other Ambulatory Visit
Admission: RE | Admit: 2023-10-30 | Discharge: 2023-10-30 | Disposition: A | Discharge status: Home / Self Care | Source: Ambulatory Visit | Attending: Nephrology | Admitting: Nephrology

## 2023-10-30 DIAGNOSIS — N186 End stage renal disease: Secondary | ICD-10-CM | POA: Diagnosis present

## 2023-10-30 LAB — HEMOGLOBIN AND HEMATOCRIT, BLOOD
HCT: 26.3 % — ABNORMAL LOW (ref 36.0–46.0)
Hemoglobin: 8.6 g/dL — ABNORMAL LOW (ref 12.0–15.0)

## 2023-11-05 ENCOUNTER — Ambulatory Visit: Admitting: Anesthesiology

## 2023-11-08 ENCOUNTER — Encounter (HOSPITAL_COMMUNITY): Payer: Self-pay | Admitting: Emergency Medicine

## 2023-11-12 ENCOUNTER — Encounter: Payer: Self-pay | Admitting: Physician Assistant

## 2023-11-12 ENCOUNTER — Ambulatory Visit (HOSPITAL_COMMUNITY)
Admission: RE | Admit: 2023-11-12 | Discharge: 2023-11-12 | Disposition: A | Discharge status: Home / Self Care | Source: Ambulatory Visit | Attending: Cardiology | Admitting: Cardiology

## 2023-11-12 DIAGNOSIS — I350 Nonrheumatic aortic (valve) stenosis: Secondary | ICD-10-CM | POA: Diagnosis present

## 2023-11-12 MED ORDER — IOHEXOL 350 MG/ML SOLN
100.0000 mL | Freq: Once | INTRAVENOUS | Status: AC | PRN
  Administered 2023-11-12: 100 mL via INTRAVENOUS

## 2023-11-13 ENCOUNTER — Ambulatory Visit: Payer: Self-pay | Admitting: Cardiovascular Disease

## 2023-12-11 NOTE — Progress Notes (Unsigned)
 [address]       [place] [address]             [phone]        Katie [name] [facility] Medical Record #[id number] Date of Birth: 09-11-1955  Referring: Cordella Corning, FNP Primary Care: Cordella Corning, FNP Primary Cardiologist:Brian Darliss, MD  Chief Complaint:   No chief complaint on file.   History of Present Illness:     Katie [name] is a 68 y.o. female presents for surgical evaluation of ***      Past Medical History:  Diagnosis Date   Chronic diastolic heart failure (HCC)    COPD (chronic obstructive pulmonary disease) (HCC)    ESRD on dialysis (HCC)    Hypertension    Severe aortic stenosis     Past Surgical History:  Procedure Laterality Date   A/V SHUNT INTERVENTION Left 12/21/2021   Procedure: A/V SHUNT INTERVENTION;  Surgeon: Marea Selinda RAMAN, MD;  Location: [facility] INVASIVE CV LAB;  Service: Cardiovascular;  Laterality: Left;   APPENDECTOMY     AV FISTULA PLACEMENT Left 03/16/2021   Procedure: INSERTION OF ARTERIOVENOUS (AV) GORE-TEX GRAFT ARM;  Surgeon: Marea Selinda RAMAN, MD;  Location: [facility] ORS;  Service: Vascular;  Laterality: Left;   DIALYSIS/PERMA CATHETER INSERTION N/A 05/16/2020   Procedure: DIALYSIS/PERMA CATHETER INSERTION;  Surgeon: Marea Selinda RAMAN, MD;  Location: [facility] INVASIVE CV LAB;  Service: Cardiovascular;  Laterality: N/A;   DIALYSIS/PERMA CATHETER REMOVAL N/A 03/26/2022   Procedure: DIALYSIS/PERMA CATHETER REMOVAL;  Surgeon: Marea Selinda RAMAN, MD;  Location: [facility] INVASIVE CV LAB;  Service: Cardiovascular;  Laterality: N/A;   DIALYSIS/PERMA CATHETER REPAIR N/A 01/22/2022   Procedure: DIALYSIS/PERMA CATHETER INSERT;  Surgeon: Marea Selinda RAMAN, MD;  Location: [facility] INVASIVE CV LAB;  Service: Cardiovascular;  Laterality: N/A;   MANDIBLE SURGERY     RENAL BIOPSY Right    RIGHT HEART CATH N/A 05/10/2020   Procedure: RIGHT HEART CATH;  Surgeon: Mady Bruckner, MD;  Location: [facility] INVASIVE CV LAB;  Service: Cardiovascular;  Laterality: N/A;    RIGHT/LEFT HEART CATH AND CORONARY ANGIOGRAPHY Bilateral 08/15/2023   Procedure: RIGHT/LEFT HEART CATH AND CORONARY ANGIOGRAPHY;  Surgeon: Anner Alm ORN, MD;  Location: [facility] INVASIVE CV LAB;  Service: Cardiovascular;  Laterality: Bilateral;   TEMPORARY DIALYSIS CATHETER N/A 05/11/2020   Procedure: TEMPORARY DIALYSIS CATHETER;  Surgeon: Marea Selinda RAMAN, MD;  Location: [facility] INVASIVE CV LAB;  Service: Cardiovascular;  Laterality: N/A;    Social History:  Social History   Tobacco Use  Smoking Status Former   Types: Cigarettes  Smokeless Tobacco Never  Tobacco Comments   Quit over 15 years ago    Social History   Substance and Sexual Activity  Alcohol  Use Not Currently     Allergies  Allergen Reactions   Penicillins Nausea And Vomiting    Pt states only pills make her vomit Other reaction(s): Nausea/Vomit      Current Outpatient Medications  Medication Sig Dispense Refill   acetaminophen  (TYLENOL ) 500 MG tablet Take 500 mg by mouth 3 (three) times daily. Take additional 500 mg every 6 hours as needed for pain     ADVAIR DISKUS 250-50 MCG/ACT AEPB Inhale 1 puff into the lungs 2 (two) times daily.     albuterol  (VENTOLIN  HFA) 108 (90 Base) MCG/ACT inhaler Inhale 1 puff into the lungs every 6 (six) hours as needed for wheezing.     aspirin  EC 81 MG tablet Take 1 tablet (81 mg total)  by mouth daily. Swallow whole.     calcium  carbonate (TUMS - DOSED IN MG ELEMENTAL CALCIUM ) 500 MG chewable tablet Chew 1 tablet by mouth 3 (three) times daily as needed (GRED).     DULoxetine  (CYMBALTA ) 30 MG capsule Take 30 mg by mouth daily.     famotidine  (PEPCID ) 20 MG tablet Take 10 mg by mouth at bedtime.     guaifenesin  (ROBITUSSIN) 100 MG/5ML syrup Take 200 mg by mouth every 4 (four) hours as needed for cough.     melatonin 3 MG TABS tablet Take 3 mg by mouth at bedtime.     midodrine  (PROAMATINE ) 5 MG tablet Take 5 mg by mouth. Monday, Wednesday, Friday prior to dialysis      ondansetron  (ZOFRAN ) 4 MG tablet Take 4 mg by mouth every Monday, Wednesday, and Friday.     oxyCODONE  (OXY IR/ROXICODONE ) 5 MG immediate release tablet Take 5 mg by mouth every 6 (six) hours.     pantoprazole  (PROTONIX ) 40 MG tablet Take 1 tablet (40 mg total) by mouth 2 (two) times daily. 60 tablet 0   Polyethylene Glycol 3350  POWD Take 1 Capful by mouth daily. 17 g     pramoxine (SARNA SENSITIVE) 1 % LOTN Apply 1 Application topically 2 (two) times daily.     Psyllium (REGULOID PO) Take 0.4 g/1.7m2 by mouth 2 (two) times daily.     saccharomyces boulardii (FLORASTOR) 250 MG capsule Take 250 mg by mouth daily.     senna (SENOKOT) 8.6 MG TABS tablet Take 1 tablet by mouth at bedtime.     senna-docusate (SENOKOT-S) 8.6-50 MG tablet Take 1 tablet by mouth 2 (two) times daily as needed for mild constipation.     topiramate  (TOPAMAX ) 50 MG tablet Take 1 tablet (50 mg total) by mouth 2 (two) times daily as needed (headache).     traZODone  (DESYREL ) 50 MG tablet Take 1 tablet (50 mg total) by mouth every evening. 30 tablet 0   No current facility-administered medications for this visit.    (Not in a hospital admission)   Family History  Problem Relation Age of Onset   Heart attack Mother    Asthma Mother    Uterine cancer Mother    Skin cancer Father    Prostate cancer Father    CAD Father    Parkinson's disease Father    Lung cancer Brother      Review of Systems:   ROS    Physical Exam: There were no vitals taken for this visit. Physical Exam    Cardiac Studies & Procedures   ______________________________________________________________________________________________ CARDIAC CATHETERIZATION  CARDIAC CATHETERIZATION 08/15/2023  Conclusion Images from the original result were not included.    Very Small Caliber Diagonal Branches: 1st Diag lesion is 90% stenosed. 2nd Diag lesion is 80% stenosed.   Mid LAD lesion is 55% stenosed.   LV end diastolic pressure is normal.    There is severe aortic valve stenosis.   Anticipated discharge date to be determined.   Plan per discussion with primary cardiology team and nephrologist would be to have the patient be admitted under the hospitalist service.  She will need to have dialysis done while she is here and have titration of medications.   Recommend Aspirin  81mg  daily for moderate CAD.  Dominance: Codominant  POST-OPERATIVE DIAGNOSIS: Severe heavily calcified aortic stenosis with mean gradient by cardiac catheterization of 40 to 50 mmHg (peak to peak gradient 59 mmHg) Single-vessel disease with a 60% LAD stenosis  just after tandem very small caliber D1 and D2 (ostium of D1-D2 a roughly 90 and 80%); large codominant LCx with relatively no significant disease; moderate caliber codominant RCA with minimal disease in the PDA. RHC hemodynamics: RAP 1 mmHg, RV P-EDP 44/1-2 mmHg; PAP-mean 44/12-24 mmHg, PCWP mean 5 mmHg.;  Fick Cardiac Output-Index 6.41-3.87 with ao sat 93% and PA sat 70%. LV P-EDP 191/1-23 mmHg; AO P-MAP 140/61-95 mmHg Aortic AoV gradient P-P59 0.5 mmHg; mean AoV gradient estimated 45 to 50 mmHg   PT TO BE ADMITTED PER NEPHROLOGIST, DR LATEEF Plan per discussion with primary cardiology team and nephrologist would be to have the patient be admitted under the hospitalist service.  She will need to have dialysis done while she is here and have titration of medications.   Recommend Aspirin  81mg  daily for moderate CAD.   Will need outpatient structural heart team    Alm Clay, MD  Findings Coronary Findings Diagnostic  Dominance: Right  Left Anterior Descending Vessel is large. The vessel exhibits minimal luminal irregularities. The vessel is moderately calcified. Proximal one third to half of vessel Mid LAD lesion is 55% stenosed. The lesion is distal to major branch, focal and concentric.  First Diagonal Branch Vessel is small in size. There is moderate disease in the vessel. 1st Diag lesion is  90% stenosed.  Second Diagonal Branch Vessel is small in size. There is mild disease in the vessel. 2nd Diag lesion is 80% stenosed.  Left Circumflex Vessel was injected. Vessel is large and very large. Vessel is angiographically normal. The vessel originates from a separate ostium. Essentially cloacal left main The main circumflex terminates as a large lateral bifurcating OM 2  First Obtuse Marginal Branch Vessel is small in size.  Third Obtuse Marginal Branch Vessel is small in size.  First Left Posterolateral Branch Vessel is small in size.  Left Posterior Atrioventricular Artery Vessel is small in size.  Right Coronary Artery Vessel was injected. Vessel is normal in caliber. Vessel is angiographically normal.  Right Posterior Descending Artery Vessel is small in size. There is mild disease in the vessel.  First Right Posterolateral Branch Vessel is small in size.  Intervention  No interventions have been documented.   CARDIAC CATHETERIZATION  CARDIAC CATHETERIZATION 05/10/2020  Conclusion Conclusions: 1. Mildly elevated left heart, right heart, and pulmonary artery pressures. 2. Normal to supranormal Fick cardiac output/index.  Recommendations: 1. Continue gentle diuresis. 2. Consider further workup of potential causes of high-output heart failure.  Lonni Hanson, MD [facility]     ECHOCARDIOGRAM  ECHOCARDIOGRAM COMPLETE 06/20/2023  Narrative ECHOCARDIOGRAM REPORT    Patient Name:   [facility] Date of Exam: 06/20/2023 Medical Rec #:  [id number]    Height:       66.0 in Accession #:    [id number]   Weight:       133.8 lb Date of Birth:  1956-01-07   BSA:          1.686 m Patient Age:    67 years     BP:           108/56 mmHg Patient Gender: F            HR:           78 bpm. Exam Location:  [facility]  Procedure: 2D Echo, Cardiac Doppler and Color Doppler  Indications:    I35.0 Nonrheumatic aortic (valve) stenosis  History:         Patient has prior history of  Echocardiogram examinations, most recent 06/16/2022. CHF, Aortic Valve Disease, Signs/Symptoms:Murmur and Hypertensive Heart Disease; Risk Factors:Hypertension, Diabetes and Former Smoker.  Sonographer:    Arley Pac RDMS, RVT, RDCS Referring Phys: [id number] [facility]   Sonographer Comments: Technically challenging study due to limited acoustic windows. IMPRESSIONS   1. Left ventricular ejection fraction, by estimation, is 60 to 65%. The left ventricle has normal function. The left ventricle has no regional wall motion abnormalities. There is moderate left ventricular hypertrophy. Left ventricular diastolic parameters are consistent with Grade I diastolic dysfunction (impaired relaxation). 2. Right ventricular systolic function is normal. The right ventricular size is normal. There is mildly elevated pulmonary artery systolic pressure. The estimated right ventricular systolic pressure is 40.5 mmHg. 3. Left atrial size was mildly dilated. 4. The mitral valve is normal in structure. No evidence of mitral valve regurgitation. No evidence of mitral stenosis. The mean mitral valve gradient is 4.0 mmHg. Moderate mitral annular calcification. 5. The aortic valve is calcified. There is severe calcifcation of the aortic valve. Aortic valve regurgitation is mild. Severe aortic valve stenosis. Aortic valve area, by VTI measures 0.87 cm. Aortic valve mean gradient measures 36.5 mmHg. Aortic valve Vmax measures 4.75 m/s. 6. There is borderline dilatation of the ascending aorta, measuring 39 mm. 7. The inferior vena cava is normal in size with greater than 50% respiratory variability, suggesting right atrial pressure of 3 mmHg.  Comparison(s): Previous AV meas, peak PG, 29 mmHg mean PG.  FINDINGS Left Ventricle: Left ventricular ejection fraction, by estimation, is 60 to 65%. The left ventricle has normal function. The left ventricle has no regional wall motion  abnormalities. The left ventricular internal cavity size was normal in size. There is moderate left ventricular hypertrophy. Left ventricular diastolic parameters are consistent with Grade I diastolic dysfunction (impaired relaxation).  Right Ventricle: The right ventricular size is normal. No increase in right ventricular wall thickness. Right ventricular systolic function is normal. There is mildly elevated pulmonary artery systolic pressure. The tricuspid regurgitant velocity is 2.98 m/s, and with an assumed right atrial pressure of 5 mmHg, the estimated right ventricular systolic pressure is 40.5 mmHg.  Left Atrium: Left atrial size was mildly dilated.  Right Atrium: Right atrial size was normal in size.  Pericardium: There is no evidence of pericardial effusion.  Mitral Valve: The mitral valve is normal in structure. There is mild calcification of the mitral valve leaflet(s). Moderate mitral annular calcification. No evidence of mitral valve regurgitation. No evidence of mitral valve stenosis. MV peak gradient, 10.9 mmHg. The mean mitral valve gradient is 4.0 mmHg.  Tricuspid Valve: The tricuspid valve is normal in structure. Tricuspid valve regurgitation is mild . No evidence of tricuspid stenosis.  Aortic Valve: The aortic valve is calcified. There is severe calcifcation of the aortic valve. Aortic valve regurgitation is mild. Severe aortic stenosis is present. Aortic valve mean gradient measures 36.5 mmHg. Aortic valve peak gradient measures 90.2 mmHg. Aortic valve area, by VTI measures 0.87 cm.  Pulmonic Valve: The pulmonic valve was normal in structure. Pulmonic valve regurgitation is not visualized. No evidence of pulmonic stenosis.  Aorta: The aortic root is normal in size and structure. There is borderline dilatation of the ascending aorta, measuring 39 mm.  Venous: The inferior vena cava is normal in size with greater than 50% respiratory variability, suggesting right atrial  pressure of 3 mmHg.  IAS/Shunts: No atrial level shunt detected by color flow Doppler.   LEFT VENTRICLE PLAX 2D LVIDd:  4.20 cm      Diastology LVIDs:         2.55 cm      LV e' medial:    5.00 cm/s LV PW:         0.80 cm      LV E/e' medial:  24.0 LV IVS:        1.70 cm      LV e' lateral:   5.66 cm/s LVOT diam:     1.90 cm      LV E/e' lateral: 21.2 LV SV:         83 LV SV Index:   49 LVOT Area:     2.84 cm  LV Volumes (MOD) LV vol d, MOD A4C: 114.0 ml LV vol s, MOD A4C: 30.6 ml LV SV MOD A4C:     114.0 ml  RIGHT VENTRICLE             IVC RV Basal diam:  3.30 cm     IVC diam: 1.00 cm RV S prime:     12.50 cm/s TAPSE (M-mode): 2.5 cm  LEFT ATRIUM             Index        RIGHT ATRIUM           Index LA diam:        3.30 cm 1.96 cm/m   RA Area:     20.90 cm LA Vol (A2C):   62.4 ml 37.02 ml/m  RA Volume:   63.80 ml  37.85 ml/m LA Vol (A4C):   78.0 ml 46.27 ml/m LA Biplane Vol: 70.7 ml 41.94 ml/m AORTIC VALVE                     PULMONIC VALVE AV Area (Vmax):    0.83 cm      PV Vmax:       0.88 m/s AV Area (Vmean):   0.92 cm      PV Peak grad:  3.1 mmHg AV Area (VTI):     0.87 cm AV Vmax:           475.00 cm/s AV Vmean:          298.000 cm/s AV VTI:            0.951 m AV Peak Grad:      90.2 mmHg AV Mean Grad:      36.5 mmHg LVOT Vmax:         139.50 cm/s LVOT Vmean:        96.325 cm/s LVOT VTI:          0.292 m LVOT/AV VTI ratio: 0.31  AORTA Ao Root diam: 3.20 cm Ao Asc diam:  3.90 cm Ao Arch diam: 2.5 cm  MITRAL VALVE                TRICUSPID VALVE MV Area (PHT): 3.17 cm     TR Peak grad:   35.5 mmHg MV Area VTI:   1.59 cm     TR Vmax:        298.00 cm/s MV Peak grad:  10.9 mmHg MV Mean grad:  4.0 mmHg     SHUNTS MV Vmax:       1.65 m/s     Systemic VTI:  0.29 m MV Vmean:      96.3 cm/s    Systemic Diam: 1.90 cm MV Decel Time: 239 msec MV E velocity: 120.00 cm/s MV A  velocity: 174.00 cm/s MV E/A ratio:  0.69  Evalene Lunger  MD Electronically signed by Evalene Lunger MD Signature Date/Time: 06/20/2023/3:13:38 PM    Final      CT SCANS  CT CORONARY MORPH W/CTA COR W/SCORE 11/12/2023  Addendum 11/13/2023  6:10 PM ADDENDUM REPORT: 11/13/2023 18:08  EXAM: OVER-READ INTERPRETATION  CT CHEST  The following report is an over-read performed by radiologist Dr. Fonda Mom [facility] on 11/13/2023. This over-read does not include interpretation of cardiac or coronary anatomy or pathology. The coronary CTA interpretation by the cardiologist is attached.  COMPARISON:  None.  FINDINGS: Cardiovascular: See findings discussed in the body of the report. Cardiomegaly and small pericardial effusion. 4 cm ascending thoracic aortic aneurysm.  Mediastinum/Nodes: No suspicious adenopathy identified. Imaged mediastinal structures are unremarkable.  Lungs/Pleura: Dense consolidation or atelectasis at the left base possibly postobstructive in etiology. Dependent subsegmental atelectasis the right base. Small pleural effusions. No pleural effusion or pneumothorax.  Upper Abdomen: No acute abnormality.  Musculoskeletal: No chest wall abnormality. No acute osseous findings.  IMPRESSION: 1. 4 cm ascending thoracic aortic aneurysm. 2. Dense consolidation or atelectasis at the left base possibly postobstructive in etiology. 3. Small pleural effusions.   Electronically Signed By: Fonda Field M.D. On: 11/13/2023 18:08  Narrative CLINICAL DATA:  67F with severe aortic stenosis being evaluated for a TAVR procedure.  EXAM: Cardiac TAVR CT  TECHNIQUE: A non-contrast, gated CT scan was obtained with axial slices of 2.5 mm through the heart for aortic valve scoring. A 120 kV retrospective, gated, contrast cardiac scan was obtained. Gantry rotation speed was 230 msec and collimation was 0.63 mm. Nitroglycerin  was not given. A delayed scan was obtained to exclude left atrial appendage  thrombus. The 3D dataset was reconstructed in systole with motion correction. The 3D data set was reconstructed in 5% intervals of the 0-95% of the R-R cycle. Systolic and diastolic phases were analyzed on a dedicated workstation using MPR, MIP, and VRT modes. The patient received 100 cc of contrast.  FINDINGS: Aortic Root:  Aortic valve: trileaflet  Aortic valve calcium  score: 3688  Aortic annulus:  Diameter: 27mm x 22mm  Perimeter: 74mm  Area: 426mm^2  Calcifications: Moderate calcifications adjacent to left cusp  Coronary height: Min Left - 12mm; Min Right - 9mm  Sinotubular height: Left cusp - 27mm; Right cusp - 18mm; Noncoronary cusp - 23mm  LVOT (as measured 3 mm below the annulus):  Diameter: 26mm x 20mm  Area: 424mm^2  Calcifications: Moderate calcifications inferior to left cusp  Aortic sinus width: Left cusp - 34mm; Right cusp - 33mm; Noncoronary cusp - 33mm  Sinotubular junction width: 35mm x 34mm  Optimum Fluoroscopic Angle for Delivery: RAO 9 CAU 19  Ascending aorta: Dilated ascending aorta measuring 40mm  Cardiac:  Right atrium: Mild enlargement  Right ventricle: Mild dilatation  Pulmonary arteries: Dilated main pulmonary artery measuring 31mm  Pulmonary veins: Normal configuration  Left atrium: Mild enlargement  Left ventricle: Normal size  Coronary arteries: Coronary calcium  score 2499 (99th percentile)  IMPRESSION: 1. Tricuspid aortic valve with severe calcifications (AV calcium  score 3688)  2. Aortic annulus measures 27mm x 22mm in diameter with perimeter 74mm and area 423 mm^2. Moderate annular calcifications adjacent to left cusp, extending into LVOT. Annular measurements are suitable for delivery of 23mm Edwards Sapien 3 valve (though on the border for a 26mm valve).  3. Low coronary height to RCA (9mm). Sufficient coronary height to left main (12mm)  4.  Optimum  Fluoroscopic Angle for Delivery:  RAO 9 CAU 19  5.   Coronary calcium  score 2499 (99th percentile)  6.  Dilated ascending aorta measuring 40mm  7.  Dilated main pulmonary artery measuring 31mm  Electronically Signed: By: Lonni Nanas M.D. On: 11/13/2023 14:44     ______________________________________________________________________________________________      ECG sinus    I have independently reviewed the above radiologic studies and discussed with the patient   Recent Lab Findings: Lab Results  Component Value Date   WBC 7.5 08/19/2023   HGB 8.6 (L) 10/30/2023   HCT 26.3 (L) 10/30/2023   PLT 130 (L) 08/19/2023   GLUCOSE 100 (H) 08/19/2023   CHOL 137 05/03/2020   TRIG 55 05/03/2020   HDL 51 05/03/2020   LDLCALC 75 05/03/2020   ALT 9 06/12/2022   AST 11 (L) 06/12/2022   NA 136 08/19/2023   K 4.4 08/19/2023   CL 96 (L) 08/19/2023   CREATININE 2.55 (H) 08/19/2023   BUN 44 (H) 08/19/2023   CO2 28 08/19/2023   TSH 0.549 03/07/2021   INR 1.1 12/11/2021   HGBA1C 4.7 (L) 09/23/2021      Assessment / Plan:   68 y.o. female with severe aortic stenosis.  STS score: ***.  NYHA Class ***.  The risks and benefits of *** TAVR were discussed in detail.  We also discussed possibility of an emergent sternotomy to address any procedural complications.  Based on our discussion, we collectively decided that an emergent sternotomy would not be indicated.  The patient is *** agreeable to proceed.  Based on my review of her LHC, echo, and CTA, I agree with the multidisciplinary plan to proceed with a *** TAVR.      I  spent {CHL ONC TIME VISIT - DTPQU:[id number]} counseling the patient face to face.   Linnie MALVA Rayas 12/11/2023 1:03 PM

## 2023-12-12 ENCOUNTER — Encounter (INDEPENDENT_AMBULATORY_CARE_PROVIDER_SITE_OTHER)

## 2023-12-12 ENCOUNTER — Encounter: Admitting: Surgery

## 2023-12-12 ENCOUNTER — Encounter (INDEPENDENT_AMBULATORY_CARE_PROVIDER_SITE_OTHER): Admitting: Nurse Practitioner

## 2023-12-12 ENCOUNTER — Ambulatory Visit
Attending: Thoracic Surgery (Cardiothoracic Vascular Surgery) | Admitting: Thoracic Surgery (Cardiothoracic Vascular Surgery)

## 2023-12-12 VITALS — BP 125/64 | HR 77 | Resp 20 | Ht 66.0 in | Wt 125.0 lb

## 2023-12-12 DIAGNOSIS — I350 Nonrheumatic aortic (valve) stenosis: Secondary | ICD-10-CM | POA: Diagnosis present

## 2023-12-16 ENCOUNTER — Emergency Department

## 2023-12-16 ENCOUNTER — Emergency Department
Admission: EM | Admit: 2023-12-16 | Discharge: 2023-12-16 | Disposition: A | Discharge status: Skilled Nursing Facility | Attending: Emergency Medicine | Admitting: Emergency Medicine

## 2023-12-16 ENCOUNTER — Other Ambulatory Visit: Payer: Self-pay

## 2023-12-16 DIAGNOSIS — I509 Heart failure, unspecified: Secondary | ICD-10-CM | POA: Insufficient documentation

## 2023-12-16 DIAGNOSIS — R112 Nausea with vomiting, unspecified: Secondary | ICD-10-CM | POA: Insufficient documentation

## 2023-12-16 DIAGNOSIS — R197 Diarrhea, unspecified: Secondary | ICD-10-CM | POA: Insufficient documentation

## 2023-12-16 DIAGNOSIS — J449 Chronic obstructive pulmonary disease, unspecified: Secondary | ICD-10-CM | POA: Diagnosis not present

## 2023-12-16 DIAGNOSIS — I132 Hypertensive heart and chronic kidney disease with heart failure and with stage 5 chronic kidney disease, or end stage renal disease: Secondary | ICD-10-CM | POA: Diagnosis not present

## 2023-12-16 DIAGNOSIS — Z992 Dependence on renal dialysis: Secondary | ICD-10-CM | POA: Diagnosis not present

## 2023-12-16 DIAGNOSIS — N186 End stage renal disease: Secondary | ICD-10-CM | POA: Diagnosis not present

## 2023-12-16 DIAGNOSIS — D631 Anemia in chronic kidney disease: Secondary | ICD-10-CM | POA: Diagnosis not present

## 2023-12-16 LAB — COMPREHENSIVE METABOLIC PANEL WITH GFR
ALT: 63 U/L — ABNORMAL HIGH (ref 0–44)
AST: 43 U/L — ABNORMAL HIGH (ref 15–41)
Albumin: 2.8 g/dL — ABNORMAL LOW (ref 3.5–5.0)
Alkaline Phosphatase: 107 U/L (ref 38–126)
Anion gap: 12 (ref 5–15)
BUN: 63 mg/dL — ABNORMAL HIGH (ref 8–23)
CO2: 25 mmol/L (ref 22–32)
Calcium: 8.8 mg/dL — ABNORMAL LOW (ref 8.9–10.3)
Chloride: 99 mmol/L (ref 98–111)
Creatinine, Ser: 4.2 mg/dL — ABNORMAL HIGH (ref 0.44–1.00)
GFR, Estimated: 11 mL/min — ABNORMAL LOW (ref >60)
Glucose, Bld: 87 mg/dL (ref 70–99)
Potassium: 4.6 mmol/L (ref 3.5–5.1)
Sodium: 136 mmol/L (ref 135–145)
Total Bilirubin: 0.7 mg/dL (ref 0.0–1.2)
Total Protein: 6 g/dL — ABNORMAL LOW (ref 6.5–8.1)

## 2023-12-16 LAB — CBC WITH DIFFERENTIAL/PLATELET
Abs Immature Granulocytes: 0.01 K/uL (ref 0.00–0.07)
Basophils Absolute: 0.1 K/uL (ref 0.0–0.1)
Basophils Relative: 1 %
Eosinophils Absolute: 0.1 K/uL (ref 0.0–0.5)
Eosinophils Relative: 2 %
HCT: 32.1 % — ABNORMAL LOW (ref 36.0–46.0)
Hemoglobin: 10 g/dL — ABNORMAL LOW (ref 12.0–15.0)
Immature Granulocytes: 0 %
Lymphocytes Relative: 27 %
Lymphs Abs: 1.3 K/uL (ref 0.7–4.0)
MCH: 32.3 pg (ref 26.0–34.0)
MCHC: 31.2 g/dL (ref 30.0–36.0)
MCV: 103.5 fL — ABNORMAL HIGH (ref 80.0–100.0)
Monocytes Absolute: 0.5 K/uL (ref 0.1–1.0)
Monocytes Relative: 11 %
Neutro Abs: 2.8 K/uL (ref 1.7–7.7)
Neutrophils Relative %: 59 %
Platelets: 110 K/uL — ABNORMAL LOW (ref 150–400)
RBC: 3.1 MIL/uL — ABNORMAL LOW (ref 3.87–5.11)
RDW: 12.3 % (ref 11.5–15.5)
WBC: 4.7 K/uL (ref 4.0–10.5)
nRBC: 0 % (ref 0.0–0.2)

## 2023-12-16 LAB — RESP PANEL BY RT-PCR (RSV, FLU A&B, COVID)  RVPGX2
Influenza A by PCR: NEGATIVE
Influenza B by PCR: NEGATIVE
Resp Syncytial Virus by PCR: NEGATIVE
SARS Coronavirus 2 by RT PCR: NEGATIVE

## 2023-12-16 LAB — PHOSPHORUS: Phosphorus: 4.4 mg/dL (ref 2.5–4.6)

## 2023-12-16 LAB — MAGNESIUM: Magnesium: 1.8 mg/dL (ref 1.7–2.4)

## 2023-12-16 LAB — LIPASE, BLOOD: Lipase: 32 U/L (ref 11–51)

## 2023-12-16 MED ORDER — ONDANSETRON 4 MG PO TBDP: 4.0000 mg | ORAL_TABLET | Freq: Three times a day (TID) | ORAL | 0 refills | Status: DC | PRN

## 2023-12-16 MED ORDER — ONDANSETRON HCL 4 MG/2ML IJ SOLN
4.0000 mg | Freq: Once | INTRAMUSCULAR | Status: AC
  Administered 2023-12-16: 4 mg via INTRAVENOUS
  Filled 2023-12-16: qty 2

## 2023-12-16 MED ORDER — MORPHINE SULFATE (PF) 4 MG/ML IV SOLN
4.0000 mg | Freq: Once | INTRAVENOUS | Status: AC
  Administered 2023-12-16: 4 mg via INTRAVENOUS
  Filled 2023-12-16: qty 1

## 2023-12-16 NOTE — ED Provider Notes (Signed)
 [facility] Provider Note    Event Date/Time   First MD Initiated Contact with Patient 12/16/23 [phone]     (approximate)   History   Nausea, Emesis, and Diarrhea   HPI  Katie [name] is a 68 y.o. female past medical history significant for ESRD on HD Monday Wednesday Friday, hypertension, COPD, CHF, who presents to the emergency department from [facility] for nausea, vomiting and diarrhea.  States that she has not been feeling well and having the symptoms for quite some time and they have acutely worsened over the past 2 or 3 days.  Abdominal pain that is cramping pain.  Denies fever or chills.  States that she has been unable to keep anything down for the past 2 days.  Believes that she is kept on most of her medications.  Denies fever or chills.  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  Did not miss any dialysis and went to dialysis on Friday.  Denies any blood in her stool.  No new medications.  On review of her MAR she takes midodrine  on Monday Wednesday Friday on dialysis days     Physical Exam   Triage Vital Signs: ED Triage Vitals  Encounter Vitals Group     BP 12/16/23 0850 (!) 158/58     Girls Systolic BP Percentile --      Girls Diastolic BP Percentile --      Boys Systolic BP Percentile --      Boys Diastolic BP Percentile --      Pulse Rate 12/16/23 0850 77     Resp --      Temp 12/16/23 0854 97.9 F (36.6 C)     Temp src --      SpO2 12/16/23 0850 94 %     Weight 12/16/23 0850 130 lb (59 kg)     Height 12/16/23 0850 5' 6 (1.676 m)     Head Circumference --      Peak Flow --      Pain Score 12/16/23 0849 7     Pain Loc --      Pain Education --      Exclude from Growth Chart --     Most recent vital signs: Vitals:   12/16/23 0854 12/16/23 0955  BP:  (!) 182/65  Pulse:  80  Resp:  14  Temp: 97.9 F (36.6 C)   SpO2:  98%    Physical  Exam Constitutional:      Appearance: She is well-developed.  HENT:     Head: Atraumatic.  Eyes:     Conjunctiva/sclera: Conjunctivae normal.  Cardiovascular:     Rate and Rhythm: Regular rhythm.  Pulmonary:     Effort: No respiratory distress.  Abdominal:     General: There is no distension.     Tenderness: There is abdominal tenderness (Left lower quadrant abdominal tenderness to palpation with no rebound or guarding).  Musculoskeletal:        General: Normal range of motion.     Cervical back: Normal range of motion.     Right lower leg: No edema.     Left lower leg: No edema.  Skin:    General: Skin is warm.     Capillary Refill: Capillary refill takes less than 2 seconds.     Comments: Left AV fistula with palpable thrill  Neurological:  General: No focal deficit present.     Mental Status: She is alert. Mental status is at baseline.     IMPRESSION / MDM / ASSESSMENT AND PLAN / ED COURSE  I reviewed the triage vital signs and the nursing notes.  Differential diagnosis including viral gastroenteritis, colitis, C. difficile, diverticulitis, electrolyte abnormality, dehydration   No tachycardic or bradycardic dysrhythmias while on cardiac telemetry.  RADIOLOGY  CT abdomen and pelvis with no acute findings.  Cholelithiasis but no findings of acute cholecystitis or biliary dilation.  Distal diverticular changes with no findings of acute inflammation.  Stable chronic atelectasis to both lung base.  LABS (all labs ordered are listed, but only abnormal results are displayed) Labs interpreted as -    Labs Reviewed  CBC WITH DIFFERENTIAL/PLATELET - Abnormal; Notable for the following components:      Result Value   RBC 3.10 (*)    Hemoglobin 10.0 (*)    HCT 32.1 (*)    MCV 103.5 (*)    Platelets 110 (*)    All other components within normal limits  COMPREHENSIVE METABOLIC PANEL WITH GFR - Abnormal; Notable for the following components:   BUN 63 (*)     Creatinine, Ser 4.20 (*)    Calcium  8.8 (*)    Total Protein 6.0 (*)    Albumin  2.8 (*)    AST 43 (*)    ALT 63 (*)    GFR, Estimated 11 (*)    All other components within normal limits  RESP PANEL BY RT-PCR (RSV, FLU A&B, COVID)  RVPGX2  GASTROINTESTINAL PANEL BY PCR, STOOL (REPLACES STOOL CULTURE)  C DIFFICILE QUICK SCREEN W PCR REFLEX    LIPASE, BLOOD  MAGNESIUM  PHOSPHORUS  URINALYSIS, W/ REFLEX TO CULTURE (INFECTION SUSPECTED)     MDM  Lab work with no significant leukocytosis.  Anemia but hemoglobin is stable at 10 with a baseline of 10.  Platelets chronically low and today it is 110.  Creatinine is at her baseline.  BUN is 63.  Glucose 87.  No significant electrolyte abnormality.  Mild elevation of AST and ALT, normal T. bili and I do not have a prior LFT to compare.  Lipase within normal limits normal magnesium and phosphorus  Given antiemetics and pain medication.  Plan for CT scan.  Stool studies ordered.  CT scan overall reassuring with no acute findings.  On reevaluation patient states she is feeling much better.  Able to tolerate p.o.  Most likely with viral illness.  COVID and influenza testing are negative.  Will give a prescription for antiemetics.  Discussed return precautions for any ongoing or worsening symptoms.  Discussed close follow-up with primary care provider.  No questions at time of discharge.  Discussed incidental findings of the patient's CT scan with the patient.     PROCEDURES:  Critical Care performed: No  Procedures  Patient's presentation is most consistent with acute presentation with potential threat to life or bodily function.   MEDICATIONS ORDERED IN ED: Medications  ondansetron  (ZOFRAN ) injection 4 mg (4 mg Intravenous Given 12/16/23 0918)  morphine  (PF) 4 MG/ML injection 4 mg (4 mg Intravenous Given 12/16/23 0950)    FINAL CLINICAL IMPRESSION(S) / ED DIAGNOSES   Final diagnoses:  Nausea vomiting and diarrhea     Rx / DC Orders    ED Discharge Orders          Ordered    ondansetron  (ZOFRAN -ODT) 4 MG disintegrating tablet  Every 8 hours PRN  12/16/23 1109             Note:  This document was prepared using Dragon voice recognition software and may include unintentional dictation errors.   Suzanne Kirsch, MD 12/16/23 1120

## 2023-12-16 NOTE — ED Triage Notes (Signed)
 Pt arrives via [facility] from [facility] N/V/D for the past two day   Dialysis pt  131/58 75 95 o2

## 2023-12-16 NOTE — Discharge Instructions (Addendum)
 You are seen in the emergency department for nausea vomiting and diarrhea.  Your lab work was overall at your normal and you did not have any significant electrolyte abnormalities.  You had a CT scan that did not show any findings to explain your symptoms.  You were given IV Zofran  and able to keep down fluids.  You are given a prescription for nausea medication.  You most likely have a viral illness.  Your COVID and influenza testing were negative.  Stay hydrated and drink plenty of fluids.  Follow-up with your primary care physician and return to the emergency department for any worsening symptoms.  zofran  (ondansetron ) - nausea medication, take 1 tablet every 8 hours as needed for nausea/vomiting.

## 2023-12-16 NOTE — ED Notes (Signed)
 [facility] called for transport to [facility]

## 2023-12-17 ENCOUNTER — Encounter (INDEPENDENT_AMBULATORY_CARE_PROVIDER_SITE_OTHER): Admitting: Vascular Surgery

## 2023-12-20 ENCOUNTER — Encounter (INDEPENDENT_AMBULATORY_CARE_PROVIDER_SITE_OTHER): Admitting: Nurse Practitioner

## 2023-12-25 ENCOUNTER — Other Ambulatory Visit: Payer: Self-pay

## 2023-12-25 DIAGNOSIS — I350 Nonrheumatic aortic (valve) stenosis: Secondary | ICD-10-CM

## 2023-12-28 NOTE — Progress Notes (Signed)
 To evaluate for viral illness including COVID/influenza

## 2023-12-31 ENCOUNTER — Other Ambulatory Visit (INDEPENDENT_AMBULATORY_CARE_PROVIDER_SITE_OTHER): Payer: Self-pay | Admitting: Vascular Surgery

## 2023-12-31 DIAGNOSIS — N186 End stage renal disease: Secondary | ICD-10-CM

## 2024-01-02 ENCOUNTER — Ambulatory Visit (INDEPENDENT_AMBULATORY_CARE_PROVIDER_SITE_OTHER): Admitting: Nurse Practitioner

## 2024-01-02 ENCOUNTER — Ambulatory Visit (INDEPENDENT_AMBULATORY_CARE_PROVIDER_SITE_OTHER)

## 2024-01-02 VITALS — BP 93/53 | HR 76 | Ht 66.0 in | Wt 130.0 lb

## 2024-01-02 DIAGNOSIS — N186 End stage renal disease: Secondary | ICD-10-CM

## 2024-01-02 DIAGNOSIS — E1122 Type 2 diabetes mellitus with diabetic chronic kidney disease: Secondary | ICD-10-CM | POA: Diagnosis not present

## 2024-01-02 DIAGNOSIS — Z992 Dependence on renal dialysis: Secondary | ICD-10-CM | POA: Diagnosis not present

## 2024-01-02 DIAGNOSIS — I10 Essential (primary) hypertension: Secondary | ICD-10-CM

## 2024-01-03 ENCOUNTER — Encounter (HOSPITAL_COMMUNITY): Payer: Self-pay | Admitting: Cardiovascular Disease

## 2024-01-03 NOTE — Progress Notes (Signed)
 Called [facility] [phone], no reports of patient with cold, fever or flu -like symptoms.

## 2024-01-06 ENCOUNTER — Encounter (INDEPENDENT_AMBULATORY_CARE_PROVIDER_SITE_OTHER): Payer: Self-pay | Admitting: Nurse Practitioner

## 2024-01-06 ENCOUNTER — Emergency Department: Admission: EM | Admit: 2024-01-06 | Discharge: 2024-01-07 | Disposition: A | Discharge status: Home / Self Care

## 2024-01-06 ENCOUNTER — Telehealth (INDEPENDENT_AMBULATORY_CARE_PROVIDER_SITE_OTHER): Payer: Self-pay

## 2024-01-06 ENCOUNTER — Emergency Department

## 2024-01-06 ENCOUNTER — Other Ambulatory Visit: Payer: Self-pay

## 2024-01-06 DIAGNOSIS — R519 Headache, unspecified: Secondary | ICD-10-CM | POA: Insufficient documentation

## 2024-01-06 DIAGNOSIS — I5032 Chronic diastolic (congestive) heart failure: Secondary | ICD-10-CM | POA: Diagnosis not present

## 2024-01-06 DIAGNOSIS — R110 Nausea: Secondary | ICD-10-CM | POA: Insufficient documentation

## 2024-01-06 DIAGNOSIS — J449 Chronic obstructive pulmonary disease, unspecified: Secondary | ICD-10-CM | POA: Insufficient documentation

## 2024-01-06 DIAGNOSIS — W1831XA Fall on same level due to stepping on an object, initial encounter: Secondary | ICD-10-CM | POA: Diagnosis not present

## 2024-01-06 DIAGNOSIS — I132 Hypertensive heart and chronic kidney disease with heart failure and with stage 5 chronic kidney disease, or end stage renal disease: Secondary | ICD-10-CM | POA: Insufficient documentation

## 2024-01-06 DIAGNOSIS — Z992 Dependence on renal dialysis: Secondary | ICD-10-CM | POA: Diagnosis not present

## 2024-01-06 DIAGNOSIS — N186 End stage renal disease: Secondary | ICD-10-CM | POA: Diagnosis not present

## 2024-01-06 DIAGNOSIS — S0990XA Unspecified injury of head, initial encounter: Secondary | ICD-10-CM | POA: Insufficient documentation

## 2024-01-06 DIAGNOSIS — W19XXXA Unspecified fall, initial encounter: Secondary | ICD-10-CM

## 2024-01-06 MED ORDER — DEXMEDETOMIDINE HCL IN NACL 400 MCG/100ML IV SOLN
0.1000 ug/kg/h | INTRAVENOUS | Status: AC
  Filled 2024-01-06: qty 100

## 2024-01-06 MED ORDER — MAGNESIUM SULFATE 50 % IJ SOLN
40.0000 meq | INTRAMUSCULAR | Status: AC
  Filled 2024-01-06: qty 9.85

## 2024-01-06 MED ORDER — HEPARIN 30,000 UNITS/1000 ML (OHS) CELLSAVER SOLUTION
Status: AC
  Filled 2024-01-06: qty 1000

## 2024-01-06 MED ORDER — CEFAZOLIN SODIUM-DEXTROSE 2-4 GM/100ML-% IV SOLN
2.0000 g | INTRAVENOUS | Status: AC
  Filled 2024-01-06: qty 100

## 2024-01-06 MED ORDER — POTASSIUM CHLORIDE 2 MEQ/ML IV SOLN
80.0000 meq | INTRAVENOUS | Status: AC
  Filled 2024-01-06: qty 40

## 2024-01-06 MED ORDER — NOREPINEPHRINE 4 MG/250ML-% IV SOLN
0.0000 ug/min | INTRAVENOUS | Status: AC
  Filled 2024-01-06: qty 250

## 2024-01-06 MED ORDER — ONDANSETRON 4 MG PO TBDP
4.0000 mg | ORAL_TABLET | Freq: Once | ORAL | Status: AC
  Administered 2024-01-06: 4 mg via ORAL
  Filled 2024-01-06: qty 1

## 2024-01-06 NOTE — Discharge Instructions (Signed)
 You have been seen in the Emergency Department (ED) today for a fall.  Your work up does not show any concerning injuries.  Please take over-the-counter ibuprofen and/or Tylenol  as needed for your pain (unless you have an allergy or your doctor as told you not to take them), or take any prescribed medication as instructed.  Please follow up with your doctor regarding today's Emergency Department (ED) visit and your recent fall.    Return to the ED if you have any worsening or intractable headache, confusion, slurred speech, weakness/numbness of any arm or leg, or any increased pain.

## 2024-01-06 NOTE — H&P (Signed)
 [address], [place] [address]             [phone]     Katie [name] [name] Medical Record #[id number] Date of Birth: April 17, 1956   Referring: Darliss Rogue, MD Primary Care: Cordella Corning, FNP Primary Cardiologist:Brian Darliss, MD   Chief Complaint:        Chief Complaint  Patient presents with   Aortic Stenosis      TAVR Consult      History of Present Illness:     Katie [name] is a 68 y.o. female presents for surgical evaluation of severe aortic stenosis.  She is currently disable, and resides in assisted living.  She relates most of her symptoms to occurring during or after her dialysis.  She admits to some chest pain and shortness of breath.                 Past Medical History:  Diagnosis Date   Chronic diastolic heart failure (HCC)     COPD (chronic obstructive pulmonary disease) (HCC)     ESRD on dialysis (HCC)     Hypertension     Severe aortic stenosis                 Past Surgical History:  Procedure Laterality Date   A/V SHUNT INTERVENTION Left 12/21/2021    Procedure: A/V SHUNT INTERVENTION;  Surgeon: Marea Selinda RAMAN, MD;  Location: [facility];  Service: Cardiovascular;  Laterality: Left;   APPENDECTOMY       AV FISTULA PLACEMENT Left 03/16/2021    Procedure: INSERTION OF ARTERIOVENOUS (AV) GORE-TEX GRAFT ARM;  Surgeon: Marea Selinda RAMAN, MD;  Location: [facility];  Service: Vascular;  Laterality: Left;   DIALYSIS/PERMA CATHETER INSERTION N/A 05/16/2020    Procedure: DIALYSIS/PERMA CATHETER INSERTION;  Surgeon: Marea Selinda RAMAN, MD;  Location: [facility];  Service: Cardiovascular;  Laterality: N/A;   DIALYSIS/PERMA CATHETER REMOVAL N/A 03/26/2022    Procedure: DIALYSIS/PERMA CATHETER REMOVAL;  Surgeon: Marea Selinda RAMAN, MD;  Location: [facility];  Service: Cardiovascular;  Laterality: N/A;   DIALYSIS/PERMA CATHETER REPAIR N/A 01/22/2022    Procedure: DIALYSIS/PERMA CATHETER INSERT;  Surgeon: Marea Selinda RAMAN, MD;  Location: [facility];  Service: Cardiovascular;  Laterality: N/A;   MANDIBLE SURGERY       RENAL BIOPSY Right     RIGHT HEART CATH N/A 05/10/2020    Procedure: RIGHT HEART CATH;  Surgeon: Mady Bruckner, MD;  Location: [facility];  Service: Cardiovascular;  Laterality: N/A;   RIGHT/LEFT HEART CATH AND CORONARY ANGIOGRAPHY Bilateral 08/15/2023    Procedure: RIGHT/LEFT HEART CATH AND CORONARY ANGIOGRAPHY;  Surgeon: Anner Alm ORN, MD;  Location: [facility];  Service: Cardiovascular;  Laterality: Bilateral;   TEMPORARY DIALYSIS CATHETER N/A 05/11/2020    Procedure: TEMPORARY DIALYSIS CATHETER;  Surgeon: Marea Selinda RAMAN, MD;  Location: [facility];  Service: Cardiovascular;  Laterality: N/A;          Social History:   Tobacco Use History  Social History        Tobacco Use  Smoking Status Former   Types: Cigarettes  Smokeless Tobacco Never  Tobacco Comments    Quit over 15 years ago      Social History       Substance and Sexual Activity  Alcohol  Use Not Currently        Allergies  Allergies  Allergen Reactions   Penicillins Nausea And Vomiting      Pt states only pills make her vomit Other reaction(s): Nausea/Vomit                  Current Outpatient Medications  Medication Sig Dispense Refill   acetaminophen  (TYLENOL ) 500 MG tablet Take 500 mg by mouth 3 (three) times daily. Take additional 500 mg every 6 hours as needed for pain       ADVAIR DISKUS 250-50 MCG/ACT AEPB Inhale 1 puff into the lungs 2 (two) times daily.       albuterol  (VENTOLIN  HFA) 108 (90 Base) MCG/ACT inhaler Inhale 1 puff into the lungs every 6 (six) hours as needed for wheezing.       aspirin  EC 81 MG tablet Take 1 tablet (81 mg total) by mouth daily. Swallow whole.       calcium  carbonate (TUMS - DOSED IN MG ELEMENTAL CALCIUM ) 500 MG chewable tablet Chew 1 tablet by mouth 3 (three) times daily as needed (GRED).       DULoxetine   (CYMBALTA ) 30 MG capsule Take 30 mg by mouth daily.       famotidine  (PEPCID ) 20 MG tablet Take 10 mg by mouth at bedtime.       guaifenesin  (ROBITUSSIN) 100 MG/5ML syrup Take 200 mg by mouth every 4 (four) hours as needed for cough.       melatonin 3 MG TABS tablet Take 3 mg by mouth at bedtime.       midodrine  (PROAMATINE ) 5 MG tablet Take 5 mg by mouth. Monday, Wednesday, Friday prior to dialysis       ondansetron  (ZOFRAN ) 4 MG tablet Take 4 mg by mouth every Monday, Wednesday, and Friday.       oxyCODONE  (OXY IR/ROXICODONE ) 5 MG immediate release tablet Take 5 mg by mouth every 6 (six) hours.       pantoprazole  (PROTONIX ) 40 MG tablet Take 1 tablet (40 mg total) by mouth 2 (two) times daily. 60 tablet 0   Polyethylene Glycol 3350  POWD Take 1 Capful by mouth daily. 17 g       pramoxine (SARNA SENSITIVE) 1 % LOTN Apply 1 Application topically 2 (two) times daily.       Psyllium (REGULOID PO) Take 0.4 g/1.7m2 by mouth 2 (two) times daily.       saccharomyces boulardii (FLORASTOR) 250 MG capsule Take 250 mg by mouth daily.       senna (SENOKOT) 8.6 MG TABS tablet Take 1 tablet by mouth at bedtime.       senna-docusate (SENOKOT-S) 8.6-50 MG tablet Take 1 tablet by mouth 2 (two) times daily as needed for mild constipation.       topiramate  (TOPAMAX ) 50 MG tablet Take 1 tablet (50 mg total) by mouth 2 (two) times daily as needed (headache).       traZODone  (DESYREL ) 50 MG tablet Take 1 tablet (50 mg total) by mouth every evening. 30 tablet 0      No current facility-administered medications for this visit.         (Not in a [name] admission)              Family History  Problem Relation Age of Onset   Heart attack Mother     Asthma Mother     Uterine cancer Mother     Skin cancer Father     Prostate cancer Father     CAD Father  Parkinson's disease Father     Lung cancer Brother              Review of Systems:    Review of Systems  Constitutional:  Positive for  malaise/fatigue.  Respiratory:  Positive for shortness of breath.   Cardiovascular:  Positive for chest pain.  Neurological:  Negative for dizziness.                  Physical Exam: BP 125/64   Pulse 77   Resp 20   Ht 5' 6 (1.676 m)   Wt 125 lb (56.7 kg)   SpO2 93% Comment: RA  BMI 20.18 kg/m  Physical Exam Constitutional:      General: She is not in acute distress.    Appearance: She is ill-appearing.     Comments: frail  HENT:     Head: Normocephalic and atraumatic.  Eyes:     Extraocular Movements: Extraocular movements intact.  Cardiovascular:     Rate and Rhythm: Normal rate.  Pulmonary:     Effort: Pulmonary effort is normal. No respiratory distress.  Abdominal:     General: Abdomen is flat. There is no distension.  Musculoskeletal:        General: Normal range of motion.     Cervical back: Normal range of motion.     Comments: Presents with a walker  Skin:    General: Skin is warm and dry.  Neurological:     General: No focal deficit present.     Mental Status: She is alert and oriented to person, place, and time.         Cardiac Studies & Procedures Objective  ______________________________________________________________________________________________ CARDIAC CATHETERIZATION   CARDIAC CATHETERIZATION 08/15/2023   Conclusion Images from the original result were not included.     Very Small Caliber Diagonal Branches: 1st Diag lesion is 90% stenosed. 2nd Diag lesion is 80% stenosed.   Mid LAD lesion is 55% stenosed.   LV end diastolic pressure is normal.   There is severe aortic valve stenosis.   Anticipated discharge date to be determined.   Plan per discussion with primary cardiology team and nephrologist would be to have the patient be admitted under the hospitalist service.  She will need to have dialysis done while she is here and have titration of medications.   Recommend Aspirin  81mg  daily for moderate CAD.   Dominance: Codominant    POST-OPERATIVE DIAGNOSIS: Severe heavily calcified aortic stenosis with mean gradient by cardiac catheterization of 40 to 50 mmHg (peak to peak gradient 59 mmHg) Single-vessel disease with a 60% LAD stenosis just after tandem very small caliber D1 and D2 (ostium of D1-D2 a roughly 90 and 80%); large codominant LCx with relatively no significant disease; moderate caliber codominant RCA with minimal disease in the PDA. RHC hemodynamics: RAP 1 mmHg, RV P-EDP 44/1-2 mmHg; PAP-mean 44/12-24 mmHg, PCWP mean 5 mmHg.;  Fick Cardiac Output-Index 6.41-3.87 with ao sat 93% and PA sat 70%. LV P-EDP 191/1-23 mmHg; AO P-MAP 140/61-95 mmHg Aortic AoV gradient P-P59 0.5 mmHg; mean AoV gradient estimated 45 to 50 mmHg     PT TO BE ADMITTED PER NEPHROLOGIST, DR LATEEF Plan per discussion with primary cardiology team and nephrologist would be to have the patient be admitted under the hospitalist service.  She will need to have dialysis done while she is here and have titration of medications.   Recommend Aspirin  81mg  daily for moderate CAD.   Will need outpatient structural heart team  Alm Clay, MD   Findings Coronary Findings Diagnostic  Dominance: Right   Left Anterior Descending Vessel is large. The vessel exhibits minimal luminal irregularities. The vessel is moderately calcified. Proximal one third to half of vessel Mid LAD lesion is 55% stenosed. The lesion is distal to major branch, focal and concentric.   First Diagonal Branch Vessel is small in size. There is moderate disease in the vessel. 1st Diag lesion is 90% stenosed.   Second Diagonal Branch Vessel is small in size. There is mild disease in the vessel. 2nd Diag lesion is 80% stenosed.   Left Circumflex Vessel was injected. Vessel is large and very large. Vessel is angiographically normal. The vessel originates from a separate ostium. Essentially cloacal left main The main circumflex terminates as a large lateral bifurcating  OM 2   First Obtuse Marginal Branch Vessel is small in size.   Third Obtuse Marginal Branch Vessel is small in size.   First Left Posterolateral Branch Vessel is small in size.   Left Posterior Atrioventricular Artery Vessel is small in size.   Right Coronary Artery Vessel was injected. Vessel is normal in caliber. Vessel is angiographically normal.   Right Posterior Descending Artery Vessel is small in size. There is mild disease in the vessel.   First Right Posterolateral Branch Vessel is small in size.   Intervention   No interventions have been documented.     CARDIAC CATHETERIZATION   CARDIAC CATHETERIZATION 05/10/2020   Conclusion Conclusions: 1. Mildly elevated left heart, right heart, and pulmonary artery pressures. 2. Normal to supranormal Fick cardiac output/index.   Recommendations: 1. Continue gentle diuresis. 2. Consider further workup of potential causes of high-output heart failure.   Lonni Hanson, MD [name]       ECHOCARDIOGRAM   ECHOCARDIOGRAM COMPLETE 06/20/2023   Narrative ECHOCARDIOGRAM REPORT       Patient Name:   Katie [name] Date of Exam: 06/20/2023 Medical Rec #:  [id number]    Height:       66.0 in Accession #:    [id number]   Weight:       133.8 lb Date of Birth:  December 22, 1955   BSA:          1.686 m Patient Age:    67 years     BP:           108/56 mmHg Patient Gender: F            HR:           78 bpm. Exam Location:  [facility]   Procedure: 2D Echo, Cardiac Doppler and Color Doppler   Indications:    I35.0 Nonrheumatic aortic (valve) stenosis   History:        Patient has prior history of Echocardiogram examinations, most recent 06/16/2022. CHF, Aortic Valve Disease, Signs/Symptoms:Murmur and Hypertensive Heart Disease; Risk Factors:Hypertension, Diabetes and Former Smoker.   Sonographer:    Arley Pac RDMS, RVT, RDCS Referring Phys: [id number] [facility]     Sonographer Comments: Technically challenging  study due to limited acoustic windows. IMPRESSIONS     1. Left ventricular ejection fraction, by estimation, is 60 to 65%. The left ventricle has normal function. The left ventricle has no regional wall motion abnormalities. There is moderate left ventricular hypertrophy. Left ventricular diastolic parameters are consistent with Grade I diastolic dysfunction (impaired relaxation). 2. Right ventricular systolic function is normal. The right ventricular size is normal. There is mildly elevated pulmonary artery systolic pressure. The estimated right  ventricular systolic pressure is 40.5 mmHg. 3. Left atrial size was mildly dilated. 4. The mitral valve is normal in structure. No evidence of mitral valve regurgitation. No evidence of mitral stenosis. The mean mitral valve gradient is 4.0 mmHg. Moderate mitral annular calcification. 5. The aortic valve is calcified. There is severe calcifcation of the aortic valve. Aortic valve regurgitation is mild. Severe aortic valve stenosis. Aortic valve area, by VTI measures 0.87 cm. Aortic valve mean gradient measures 36.5 mmHg. Aortic valve Vmax measures 4.75 m/s. 6. There is borderline dilatation of the ascending aorta, measuring 39 mm. 7. The inferior vena cava is normal in size with greater than 50% respiratory variability, suggesting right atrial pressure of 3 mmHg.   Comparison(s): Previous AV meas, peak PG, 29 mmHg mean PG.   FINDINGS Left Ventricle: Left ventricular ejection fraction, by estimation, is 60 to 65%. The left ventricle has normal function. The left ventricle has no regional wall motion abnormalities. The left ventricular internal cavity size was normal in size. There is moderate left ventricular hypertrophy. Left ventricular diastolic parameters are consistent with Grade I diastolic dysfunction (impaired relaxation).   Right Ventricle: The right ventricular size is normal. No increase in right ventricular wall thickness. Right  ventricular systolic function is normal. There is mildly elevated pulmonary artery systolic pressure. The tricuspid regurgitant velocity is 2.98 m/s, and with an assumed right atrial pressure of 5 mmHg, the estimated right ventricular systolic pressure is 40.5 mmHg.   Left Atrium: Left atrial size was mildly dilated.   Right Atrium: Right atrial size was normal in size.   Pericardium: There is no evidence of pericardial effusion.   Mitral Valve: The mitral valve is normal in structure. There is mild calcification of the mitral valve leaflet(s). Moderate mitral annular calcification. No evidence of mitral valve regurgitation. No evidence of mitral valve stenosis. MV peak gradient, 10.9 mmHg. The mean mitral valve gradient is 4.0 mmHg.   Tricuspid Valve: The tricuspid valve is normal in structure. Tricuspid valve regurgitation is mild . No evidence of tricuspid stenosis.   Aortic Valve: The aortic valve is calcified. There is severe calcifcation of the aortic valve. Aortic valve regurgitation is mild. Severe aortic stenosis is present. Aortic valve mean gradient measures 36.5 mmHg. Aortic valve peak gradient measures 90.2 mmHg. Aortic valve area, by VTI measures 0.87 cm.   Pulmonic Valve: The pulmonic valve was normal in structure. Pulmonic valve regurgitation is not visualized. No evidence of pulmonic stenosis.   Aorta: The aortic root is normal in size and structure. There is borderline dilatation of the ascending aorta, measuring 39 mm.   Venous: The inferior vena cava is normal in size with greater than 50% respiratory variability, suggesting right atrial pressure of 3 mmHg.   IAS/Shunts: No atrial level shunt detected by color flow Doppler.     LEFT VENTRICLE PLAX 2D LVIDd:         4.20 cm      Diastology LVIDs:         2.55 cm      LV e' medial:    5.00 cm/s LV PW:         0.80 cm      LV E/e' medial:  24.0 LV IVS:        1.70 cm      LV e' lateral:   5.66 cm/s LVOT diam:      1.90 cm      LV E/e' lateral: 21.2 LV SV:  83 LV SV Index:   49 LVOT Area:     2.84 cm   LV Volumes (MOD) LV vol d, MOD A4C: 114.0 ml LV vol s, MOD A4C: 30.6 ml LV SV MOD A4C:     114.0 ml   RIGHT VENTRICLE             IVC RV Basal diam:  3.30 cm     IVC diam: 1.00 cm RV S prime:     12.50 cm/s TAPSE (M-mode): 2.5 cm   LEFT ATRIUM             Index        RIGHT ATRIUM           Index LA diam:        3.30 cm 1.96 cm/m   RA Area:     20.90 cm LA Vol (A2C):   62.4 ml 37.02 ml/m  RA Volume:   63.80 ml  37.85 ml/m LA Vol (A4C):   78.0 ml 46.27 ml/m LA Biplane Vol: 70.7 ml 41.94 ml/m AORTIC VALVE                     PULMONIC VALVE AV Area (Vmax):    0.83 cm      PV Vmax:       0.88 m/s AV Area (Vmean):   0.92 cm      PV Peak grad:  3.1 mmHg AV Area (VTI):     0.87 cm AV Vmax:           475.00 cm/s AV Vmean:          298.000 cm/s AV VTI:            0.951 m AV Peak Grad:      90.2 mmHg AV Mean Grad:      36.5 mmHg LVOT Vmax:         139.50 cm/s LVOT Vmean:        96.325 cm/s LVOT VTI:          0.292 m LVOT/AV VTI ratio: 0.31   AORTA Ao Root diam: 3.20 cm Ao Asc diam:  3.90 cm Ao Arch diam: 2.5 cm   MITRAL VALVE                TRICUSPID VALVE MV Area (PHT): 3.17 cm     TR Peak grad:   35.5 mmHg MV Area VTI:   1.59 cm     TR Vmax:        298.00 cm/s MV Peak grad:  10.9 mmHg MV Mean grad:  4.0 mmHg     SHUNTS MV Vmax:       1.65 m/s     Systemic VTI:  0.29 m MV Vmean:      96.3 cm/s    Systemic Diam: 1.90 cm MV Decel Time: 239 msec MV E velocity: 120.00 cm/s MV A velocity: 174.00 cm/s MV E/A ratio:  0.69   Evalene Lunger MD Electronically signed by Evalene Lunger MD Signature Date/Time: 06/20/2023/3:13:38 PM       Final       CT SCANS   CT CORONARY MORPH W/CTA COR W/SCORE 11/12/2023   Addendum 11/13/2023  6:10 PM ADDENDUM REPORT: 11/13/2023 18:08   EXAM: OVER-READ INTERPRETATION  CT CHEST   The following report is an over-read performed by  radiologist Dr. Fonda Mom [facility] on 11/13/2023. This over-read does not include interpretation of cardiac or coronary anatomy or pathology.  The coronary CTA interpretation by the cardiologist is attached.   COMPARISON:  None.   FINDINGS: Cardiovascular: See findings discussed in the body of the report. Cardiomegaly and small pericardial effusion. 4 cm ascending thoracic aortic aneurysm.   Mediastinum/Nodes: No suspicious adenopathy identified. Imaged mediastinal structures are unremarkable.   Lungs/Pleura: Dense consolidation or atelectasis at the left base possibly postobstructive in etiology. Dependent subsegmental atelectasis the right base. Small pleural effusions. No pleural effusion or pneumothorax.   Upper Abdomen: No acute abnormality.   Musculoskeletal: No chest wall abnormality. No acute osseous findings.   IMPRESSION: 1. 4 cm ascending thoracic aortic aneurysm. 2. Dense consolidation or atelectasis at the left base possibly postobstructive in etiology. 3. Small pleural effusions.     Electronically Signed By: Fonda Field M.D. On: 11/13/2023 18:08   Narrative CLINICAL DATA:  31F with severe aortic stenosis being evaluated for a TAVR procedure.   EXAM: Cardiac TAVR CT   TECHNIQUE: A non-contrast, gated CT scan was obtained with axial slices of 2.5 mm through the heart for aortic valve scoring. A 120 kV retrospective, gated, contrast cardiac scan was obtained. Gantry rotation speed was 230 msec and collimation was 0.63 mm. Nitroglycerin  was not given. A delayed scan was obtained to exclude left atrial appendage thrombus. The 3D dataset was reconstructed in systole with motion correction. The 3D data set was reconstructed in 5% intervals of the 0-95% of the R-R cycle. Systolic and diastolic phases were analyzed on a dedicated workstation using MPR, MIP, and VRT modes. The patient received 100 cc of contrast.    FINDINGS: Aortic Root:   Aortic valve: trileaflet   Aortic valve calcium  score: 3688   Aortic annulus:   Diameter: 27mm x 22mm   Perimeter: 74mm   Area: 425mm^2   Calcifications: Moderate calcifications adjacent to left cusp   Coronary height: Min Left - 12mm; Min Right - 9mm   Sinotubular height: Left cusp - 27mm; Right cusp - 18mm; Noncoronary cusp - 23mm   LVOT (as measured 3 mm below the annulus):   Diameter: 26mm x 20mm   Area: 485mm^2   Calcifications: Moderate calcifications inferior to left cusp   Aortic sinus width: Left cusp - 34mm; Right cusp - 33mm; Noncoronary cusp - 33mm   Sinotubular junction width: 35mm x 34mm   Optimum Fluoroscopic Angle for Delivery: RAO 9 CAU 19   Ascending aorta: Dilated ascending aorta measuring 40mm   Cardiac:   Right atrium: Mild enlargement   Right ventricle: Mild dilatation   Pulmonary arteries: Dilated main pulmonary artery measuring 31mm   Pulmonary veins: Normal configuration   Left atrium: Mild enlargement   Left ventricle: Normal size   Coronary arteries: Coronary calcium  score 2499 (99th percentile)   IMPRESSION: 1. Tricuspid aortic valve with severe calcifications (AV calcium  score 3688)   2. Aortic annulus measures 27mm x 22mm in diameter with perimeter 74mm and area 423 mm^2. Moderate annular calcifications adjacent to left cusp, extending into LVOT. Annular measurements are suitable for delivery of 23mm Edwards Sapien 3 valve (though on the border for a 26mm valve).   3. Low coronary height to RCA (9mm). Sufficient coronary height to left main (12mm)   4.  Optimum Fluoroscopic Angle for Delivery:  RAO 9 CAU 19   5.  Coronary calcium  score 2499 (99th percentile)   6.  Dilated ascending aorta measuring 40mm   7.  Dilated main pulmonary artery measuring 31mm   Electronically Signed: By: Lonni Nanas M.D. On:  11/13/2023 14:44        ______________________________________________________________________________________________       ECG sinus    I have independently reviewed the above radiologic studies and discussed with the patient    Recent Lab Findings: Recent Labs       Lab Results  Component Value Date    WBC 7.5 08/19/2023    HGB 8.6 (L) 10/30/2023    HCT 26.3 (L) 10/30/2023    PLT 130 (L) 08/19/2023    GLUCOSE 100 (H) 08/19/2023    CHOL 137 05/03/2020    TRIG 55 05/03/2020    HDL 51 05/03/2020    LDLCALC 75 05/03/2020    ALT 9 06/12/2022    AST 11 (L) 06/12/2022    NA 136 08/19/2023    K 4.4 08/19/2023    CL 96 (L) 08/19/2023    CREATININE 2.55 (H) 08/19/2023    BUN 44 (H) 08/19/2023    CO2 28 08/19/2023    TSH 0.549 03/07/2021    INR 1.1 12/11/2021    HGBA1C 4.7 (L) 09/23/2021            Assessment / Plan:   68 y.o. female with severe aortic stenosis.  STS score: 5.76.  NYHA Class II.  The risks and benefits of transfemoral TAVR were discussed in detail.  We also discussed possibility of an emergent sternotomy to address any procedural complications.  Based on our discussion, we collectively decided that an emergent sternotomy would not be indicated.  The patient is agreeable to proceed.  Based on my review of her LHC, echo, and CTA, I agree with the multidisciplinary plan to proceed with a transfemoral 23mm Sapien TAVR.           I  spent 30 minutes counseling the patient face to face.     Linnie MALVA Rayas 12/12/2023 4:12 PM

## 2024-01-06 NOTE — ED Triage Notes (Signed)
 Pt comes via EMS from Dialysis with c/o fall. Pt was being weighted and step off scale and tripped. Pt hit her head and left side. Pt states knee arm pain. Pt denies any loc. Pt is on thinners

## 2024-01-06 NOTE — Progress Notes (Signed)
 Subjective:    Patient ID: Katie [name], female    DOB: Dec 04, 1955, 68 y.o.   MRN: [id number] Chief Complaint  Patient presents with   New Patient (Initial Visit)     consult. bleeding around site. lateef, munsoor.     The patient returns to the office for follow up regarding a problem with their dialysis access.   The patient notes a significant increase in problems with dialysis.  It is reported that adequate dialysis is not being achieved.    The patient denies hand pain or other symptoms consistent with steal phenomena.  No significant arm swelling.  The patient denies redness or swelling at the access site. The patient denies fever or chills at home or while on dialysis.  No recent shortening of the patient's walking distance or new symptoms consistent with claudication.  No history of rest pain symptoms. No new ulcers or wounds of the lower extremities have occurred.  The patient denies amaurosis fugax or recent TIA symptoms. There are no recent neurological changes noted. There is no history of DVT, PE or superficial thrombophlebitis. No recent episodes of angina or shortness of breath documented.   Duplex ultrasound of the AV access shows a patent access.  The previously noted stenosis is significantly increased compared to last study.  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    Review of Systems  Neurological:  Positive for weakness.  All other systems reviewed and are negative.      Objective:   Physical Exam Vitals reviewed.  HENT:     Head: Normocephalic.  Cardiovascular:     Rate and Rhythm: Normal rate.     Pulses:          Radial pulses are 1+ on the left side.  Pulmonary:     Effort: Pulmonary effort is normal.  Skin:    General: Skin is warm and dry.  Neurological:     Mental Status: She is alert and oriented to person,  place, and time.  Psychiatric:        Mood and Affect: Mood normal.        Behavior: Behavior normal.        Thought Content: Thought content normal.     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  Tobacco Use   Smoking status: Former    Types: Cigarettes   Smokeless tobacco: Never   Tobacco comments:    Quit over 15 years ago  Vaping Use   Vaping status: Never Used  Substance and Sexual Activity   Alcohol  use: Not Currently   Drug use: Never  Sexual activity: Not on file  Other Topics Concern   Not on file  Social History Narrative   Not on file   Social Drivers of Health   Financial Resource Strain: Not on file  Food Insecurity: Patient Declined (08/15/2023)   Hunger Vital Sign    Worried About Running Out of Food in the Last Year: Patient declined    Ran Out of Food in the Last Year: Patient declined  Transportation Needs: Unknown (08/15/2023)   PRAPARE - Administrator, Civil Service (Medical): Patient declined    Lack of Transportation (Non-Medical): Not on file  Physical Activity: Not on file  Stress: Not on file  Social Connections: Patient Declined (08/15/2023)   Social Connection and Isolation Panel    Frequency of Communication with Friends and Family: Patient declined    Frequency of Social Gatherings with Friends and Family: Patient declined    Attends Religious Services:  Patient declined    Active Member of Clubs or Organizations: Patient declined    Attends Banker Meetings: Patient declined    Marital Status: Patient declined  Intimate Partner Violence: Unknown (08/15/2023)   Humiliation, Afraid, Rape, and Kick questionnaire    Fear of Current or Ex-Partner: Patient declined    Emotionally Abused: Not on file    Physically Abused: Not on file    Sexually Abused: Not on file    Past Surgical History:  Procedure Laterality Date   A/V SHUNT INTERVENTION Left 12/21/2021   Procedure: A/V SHUNT INTERVENTION;  Surgeon: Marea Selinda RAMAN, MD;  Location: [facility];  Service: Cardiovascular;  Laterality: Left;   APPENDECTOMY     AV FISTULA PLACEMENT Left 03/16/2021   Procedure: INSERTION OF ARTERIOVENOUS (AV) GORE-TEX GRAFT ARM;  Surgeon: Marea Selinda RAMAN, MD;  Location: [facility];  Service: Vascular;  Laterality: Left;   DIALYSIS/PERMA CATHETER INSERTION N/A 05/16/2020   Procedure: DIALYSIS/PERMA CATHETER INSERTION;  Surgeon: Marea Selinda RAMAN, MD;  Location: [facility];  Service: Cardiovascular;  Laterality: N/A;   DIALYSIS/PERMA CATHETER REMOVAL N/A 03/26/2022   Procedure: DIALYSIS/PERMA CATHETER REMOVAL;  Surgeon: Marea Selinda RAMAN, MD;  Location: [facility];  Service: Cardiovascular;  Laterality: N/A;   DIALYSIS/PERMA CATHETER REPAIR N/A 01/22/2022   Procedure: DIALYSIS/PERMA CATHETER INSERT;  Surgeon: Marea Selinda RAMAN, MD;  Location: [facility];  Service: Cardiovascular;  Laterality: N/A;   MANDIBLE SURGERY     RENAL BIOPSY Right    RIGHT HEART CATH N/A 05/10/2020   Procedure: RIGHT HEART CATH;  Surgeon: Mady Bruckner, MD;  Location: [facility];  Service: Cardiovascular;  Laterality: N/A;   RIGHT/LEFT HEART CATH AND CORONARY ANGIOGRAPHY Bilateral 08/15/2023   Procedure: RIGHT/LEFT HEART CATH AND CORONARY ANGIOGRAPHY;  Surgeon: Anner Alm ORN, MD;  Location: [facility];  Service: Cardiovascular;  Laterality:  Bilateral;   TEMPORARY DIALYSIS CATHETER N/A 05/11/2020   Procedure: TEMPORARY DIALYSIS CATHETER;  Surgeon: Marea Selinda RAMAN, MD;  Location: [facility];  Service: Cardiovascular;  Laterality: N/A;    Family History  Problem Relation Age of Onset   Heart attack Mother    Asthma Mother    Uterine cancer Mother    Skin cancer Father    Prostate cancer Father    CAD Father    Parkinson's disease Father    Lung cancer Brother     Allergies  Allergen Reactions   Penicillins Nausea And Vomiting    Pt states only pills make  her vomit Other reaction(s): Nausea/Vomit       Latest Ref Rng & Units 12/16/2023    8:53 AM 10/30/2023    2:59 PM 08/19/2023    6:14 PM  CBC  WBC 4.0 - 10.5 K/uL 4.7   7.5   Hemoglobin 12.0 - 15.0 g/dL 89.9  8.6  88.7   Hematocrit 36.0 - 46.0 % 32.1  26.3  35.0   Platelets 150 - 400 K/uL 110   130       CMP     Component Value Date/Time   NA 136 12/16/2023 0853   NA 139 08/06/2023 1545   K 4.6 12/16/2023 0853   CL 99 12/16/2023 0853   CO2 25 12/16/2023 0853   GLUCOSE 87 12/16/2023 0853   BUN 63 (H) 12/16/2023 0853   BUN 60 (H) 08/06/2023 1545   CREATININE 4.20 (H) 12/16/2023 0853   CALCIUM  8.8 (L) 12/16/2023 0853   PROT 6.0 (L) 12/16/2023 0853   ALBUMIN  2.8 (L) 12/16/2023 0853   AST 43 (H) 12/16/2023 0853   ALT 63 (H) 12/16/2023 0853   ALKPHOS 107 12/16/2023 0853   BILITOT 0.7 12/16/2023 0853   EGFR 15 (L) 08/06/2023 1545   GFRNONAA 11 (L) 12/16/2023 0853     No results found.     Assessment & Plan:   1. ESRD (end stage renal disease) (HCC) (Primary) Recommend:  The patient is experiencing increasing problems with their dialysis access.  Patient should have a fistulagram with the intention for intervention.  The intention for intervention is to restore appropriate flow and prevent thrombosis and possible loss of the access.  As well as improve the quality of dialysis therapy.  The risks, benefits and alternative therapies were  reviewed in detail with the patient.  All questions were answered.  The patient agrees to proceed with angio/intervention.    The patient will follow up with me in the office after the procedure.   2. Primary hypertension Continue antihypertensive medications as already ordered, these medications have been reviewed and there are no changes at this time.  3. Type 2 diabetes mellitus with chronic kidney disease on chronic dialysis, unspecified whether long term insulin  use (HCC) Continue hypoglycemic medications as already ordered, these medications have been reviewed and there are no changes at this time.  Hgb A1C to be monitored as already arranged by primary service   Current Outpatient Medications on File Prior to Visit  Medication Sig Dispense Refill   acetaminophen  (TYLENOL ) 500 MG tablet Take 500 mg by mouth 3 (three) times daily. Take additional 500 mg every 6 hours as needed for pain     ADVAIR DISKUS 250-50 MCG/ACT AEPB Inhale 1 puff into the lungs 2 (two) times daily.     albuterol  (VENTOLIN  HFA) 108 (90 Base) MCG/ACT inhaler Inhale 1 puff into the lungs every 6 (six) hours as needed for wheezing.     aspirin  EC 81 MG tablet Take 1 tablet (81 mg total) by mouth daily. Swallow whole.     calcium  carbonate (TUMS - DOSED IN MG ELEMENTAL CALCIUM ) 500 MG chewable tablet Chew 1 tablet by mouth 3 (three) times daily as needed (GRED).     DULoxetine  (CYMBALTA ) 30 MG capsule Take 30 mg by mouth daily.     famotidine  (PEPCID ) 20 MG tablet Take 10 mg by mouth at bedtime.     guaifenesin  (ROBITUSSIN) 100 MG/5ML syrup Take 200 mg by mouth every 4 (four) hours as needed for cough.  melatonin 3 MG TABS tablet Take 3 mg by mouth at bedtime.     midodrine  (PROAMATINE ) 5 MG tablet Take 5 mg by mouth. Monday, Wednesday, Friday prior to dialysis     ondansetron  (ZOFRAN ) 4 MG tablet Take 4 mg by mouth every Monday, Wednesday, and Friday.     ondansetron  (ZOFRAN -ODT) 4 MG disintegrating  tablet Take 1 tablet (4 mg total) by mouth every 8 (eight) hours as needed for nausea or vomiting. 30 tablet 0   oxyCODONE  (OXY IR/ROXICODONE ) 5 MG immediate release tablet Take 5 mg by mouth every 6 (six) hours.     pantoprazole  (PROTONIX ) 40 MG tablet Take 1 tablet (40 mg total) by mouth 2 (two) times daily. 60 tablet 0   Polyethylene Glycol 3350  POWD Take 1 Capful by mouth daily. 17 g     pramoxine (SARNA SENSITIVE) 1 % LOTN Apply 1 Application topically 2 (two) times daily.     Psyllium (REGULOID PO) Take 0.4 g/1.7m2 by mouth 2 (two) times daily.     saccharomyces boulardii (FLORASTOR) 250 MG capsule Take 250 mg by mouth daily.     senna (SENOKOT) 8.6 MG TABS tablet Take 1 tablet by mouth at bedtime.     senna-docusate (SENOKOT-S) 8.6-50 MG tablet Take 1 tablet by mouth 2 (two) times daily as needed for mild constipation.     topiramate  (TOPAMAX ) 50 MG tablet Take 1 tablet (50 mg total) by mouth 2 (two) times daily as needed (headache).     traZODone  (DESYREL ) 50 MG tablet Take 1 tablet (50 mg total) by mouth every evening. 30 tablet 0   No current facility-administered medications on file prior to visit.    There are no Patient Instructions on file for this visit. No follow-ups on file.   Macrina Lehnert E [name], NP

## 2024-01-06 NOTE — ED Notes (Signed)
 Attempted contact with [facility] x2 regarding transportation for patient with no answer.

## 2024-01-06 NOTE — ED Provider Notes (Signed)
 [facility] Provider Note    Event Date/Time   First MD Initiated Contact with Patient 01/06/24 1643     (approximate)   History   Fall   HPI  Katie [name] is a 68 y.o. female  with a past medical history of hypertension, type 2 diabetes, end-stage renal disease, chronic diastolic heart failure, COPD, severe aortic stenosis presents to the emergency department following a mechanical fall that occurred today.  Patient is a residence of [facility] and states she was at dialysis and stepped off a scale while weighing herself and tripped.  Patient states she hit her left front portion of her head and is having pain all down her left side.  Also endorses headache along where she hit her head and some nausea. Denies abdominal pain, chest pain, SOB, new vision changes, photophobia, any other recent falls. Ambulates with a walker. 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    Physical Exam   Triage Vital Signs: ED Triage Vitals  Encounter Vitals Group     BP 01/06/24 1529 (!) 148/58     Girls Systolic BP Percentile --      Girls Diastolic BP Percentile --      Boys Systolic BP Percentile --      Boys Diastolic BP Percentile --      Pulse Rate 01/06/24 1529 87     Resp 01/06/24 1529 18     Temp 01/06/24 1529 98 F (36.7 C)     Temp src --      SpO2 01/06/24 1529 95 %     Weight --      Height --      Head Circumference --      Peak Flow --      Pain Score 01/06/24 1520 10     Pain Loc --      Pain Education --      Exclude from Growth Chart --     Most recent vital signs: Vitals:   01/06/24 1529  BP: (!) 148/58  Pulse: 87  Resp: 18  Temp: 98 F (36.7 C)  SpO2: 95%    General: Awake, in no acute distress. Making jokes during conversation. Head: Normocephalic. Small, hard bump to left frontal lobe region, no evidence of hematoma. No facial swelling. Eyes: PERRLA. EOMs intact. No scleral icterus or conjunctival  injection. Ears/Nose/Throat: Nares patent, no nasal discharge. Oropharynx moist, no erythema or exudate.  Neck: Supple, no nuchal rigidity. CV: Systolic, harsh, crescendo-decrescendo murmur heard best in right upper sternal border. Good peripheral perfusion. No edema.  Respiratory:Normal respiratory effort.  No respiratory distress. CTAB. GI: Soft, non-distended, non-tender.  MSK: Normal ROM and  5/5 strength in b/l upper and lower extremities.  Skin:Warm, dry, intact. No rashes, lesions, or ecchymosis. No cyanosis or pallor. Neurological: A&Ox4 to person, place, time, and situation. Cranial nerves III-XII grossly intact. Strength symmetric. No focal deficits.  Psychiatric: Mood and affect appropriate. Thought processes coherent.  GCS 15  ED Results / Procedures / Treatments   Labs (all labs ordered are listed, but only abnormal results are displayed) Labs Reviewed - No data to display   EKG    RADIOLOGY  CT HEAD   BRAIN AND VENTRICLES: Mild cerebral atrophy. Periventricular white matter hypodensities, similar to the prior CT and nonspecific but compatible with minimal chronic small vessel ischemic disease. No acute intracranial hemorrhage. No mass effect or midline shift. No abnormal extra-axial fluid collection. Gray-white differentiation  is maintained. No hydrocephalus.   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   SINUSES AND MASTOIDS: No acute abnormality.   SOFT TISSUES AND SKULL: Calcified atherosclerosis of the skull base. No acute skull fracture. No acute soft tissue abnormality.   CT CERVICAL SPINE   BONES AND ALIGNMENT: Chronic straightening of the normal cervical lordosis. Grade 1 retrolisthesis of C6 on C7. No acute fracture or suspicious lesion.   DEGENERATIVE  CHANGES: Moderate disc space narrowing at C5-6 and severe narrowing at C6-7 with partial fusion between the vertebral bodies at the latter. Broad-based posterior disc osteophyte complex at C5-6 results in mild spinal stenosis. Small central disc protrusion at C4-5 resulting in at most mild spinal stenosis.   SOFT TISSUES: No prevertebral soft tissue swelling. Prominent atherosclerotic calcification of the carotid bifurcations. Multiple thyroid nodules measuring up to 1 cm with no follow up imaging required.  CT head and cervical spine IMPRESSION: 1. No acute intracranial abnormality or cervical spine fracture. 2. Cervical disc degeneration as above.   PROCEDURES:  Critical Care performed: No   Procedures   MEDICATIONS ORDERED IN ED: Medications  ondansetron  (ZOFRAN -ODT) disintegrating tablet 4 mg (4 mg Oral Given 01/06/24 1741)     IMPRESSION / MDM / ASSESSMENT AND PLAN / ED COURSE  I reviewed the triage vital signs and the nursing notes.                              Differential diagnosis includes, but is not limited to, mechanical fall, scalp hematoma, skull fracture, epidural or subdural hematoma, concussion  Patient's presentation is most consistent with acute presentation with potential threat to life or bodily function.  Patient is a 68 year old female presenting today after mechanical fall in which he hit the left frontal portion of her head.  Patient is alert and oriented, joking during our conversation in the room.  Concerning findings on physical exam.  Will forego x-rays of any extremities or that abdomen as she does not have any specific tenderness on palpation, and has good strength and sensation on exam in all extremities.  Provided Zofran  for nausea, patient states she is feeling better.  CT of the head and cervical spine with no acute intracranial abnormality or cervical spine fracture.  Patient does have noted chronic cervical disc degeneration.  Would like her to  follow-up with her primary care provider following today's visit.  Patient to be transported back to [facility] via their Present[facility], [facility].  The patient may return to the emergency department for any new, worsening, or concerning symptoms. Patient was given the opportunity to ask questions; all questions were answered. Emergency department return precautions were discussed with the patient.  Patient is in agreement to the treatment plan.  Patient is stable for discharge.   FINAL CLINICAL IMPRESSION(S) / ED DIAGNOSES   Final diagnoses:  Fall, initial encounter  Minor head injury, initial encounter     Rx / DC Orders   ED Discharge Orders     None        Note:  This document was prepared using Dragon voice recognition software and may include unintentional dictation errors.     Sheron Salm, PA-C 01/06/24 1801    Clarine Ozell LABOR, MD 01/06/24 2352

## 2024-01-06 NOTE — ED Notes (Signed)
 Called [facility] for transport back to [facility] spoke with Will /stated it would be after midnight for pick up

## 2024-01-06 NOTE — Telephone Encounter (Signed)
 Spoke with the receptionist at [facility] and the patient is scheduled with Dr. Marea for a left arm fistulagram on 01/23/24 with a 12:00 pm arrival time to the [facility]. Pre-procedure instructions will be faxed to attn. Catina Stokes.

## 2024-01-07 ENCOUNTER — Encounter (HOSPITAL_COMMUNITY): Payer: Self-pay | Admitting: Certified Registered Nurse Anesthetist

## 2024-01-07 ENCOUNTER — Inpatient Hospital Stay (HOSPITAL_COMMUNITY): Admission: RE | Admit: 2024-01-07 | Source: Home / Self Care | Admitting: Cardiovascular Disease

## 2024-01-07 ENCOUNTER — Telehealth: Payer: Self-pay

## 2024-01-07 ENCOUNTER — Inpatient Hospital Stay (HOSPITAL_COMMUNITY)

## 2024-01-07 DIAGNOSIS — I350 Nonrheumatic aortic (valve) stenosis: Secondary | ICD-10-CM

## 2024-01-07 HISTORY — DX: Type 2 diabetes mellitus without complications: E11.9

## 2024-01-07 SURGERY — TRANSCATHETER AORTIC VALVE REPLACEMENT, TRANSFEMORAL (CATHLAB)
Anesthesia: Monitor Anesthesia Care

## 2024-01-07 NOTE — ED Notes (Signed)
 Patient taken via [facility], they did not notify this RN of their arrival or departure.

## 2024-01-07 NOTE — Telephone Encounter (Signed)
 Pt scheduled for TAVR today and was instructed to arrive at [facility] at 10:30 AM. The patient has not arrived and I contacted [facility] where the pt resides, they advised that they do not have anyone on staff today who can provide transportation.  The transportation staff took the day off.  I advised that this is not an appointment today and the pt is scheduled for heart valve replacement. The patient and Damien (Publishing copy) where aware of instructions on August 13th by phone and fax and verbalized understanding at that time of instructions.  I advised the staff that we will have to reschedule the pt's surgery and that she will require another office visit due to it being more than 30 days since she has seen our team.  I will contact the facility when Damien returns to discuss scheduling.

## 2024-01-08 ENCOUNTER — Other Ambulatory Visit: Payer: Self-pay

## 2024-01-08 DIAGNOSIS — I350 Nonrheumatic aortic (valve) stenosis: Secondary | ICD-10-CM

## 2024-01-10 MED ORDER — DEXMEDETOMIDINE HCL IN NACL 400 MCG/100ML IV SOLN
0.1000 ug/kg/h | INTRAVENOUS | Status: DC
  Filled 2024-01-10: qty 100

## 2024-01-10 MED ORDER — MAGNESIUM SULFATE 50 % IJ SOLN
40.0000 meq | INTRAMUSCULAR | Status: DC
  Filled 2024-01-10: qty 9.85

## 2024-01-10 MED ORDER — CEFAZOLIN SODIUM-DEXTROSE 2-4 GM/100ML-% IV SOLN
2.0000 g | INTRAVENOUS | Status: DC
  Filled 2024-01-10: qty 100

## 2024-01-10 MED ORDER — HEPARIN 30,000 UNITS/1000 ML (OHS) CELLSAVER SOLUTION
Status: DC
  Filled 2024-01-10: qty 1000

## 2024-01-10 MED ORDER — POTASSIUM CHLORIDE 2 MEQ/ML IV SOLN
80.0000 meq | INTRAVENOUS | Status: DC
  Filled 2024-01-10: qty 40

## 2024-01-10 MED ORDER — NOREPINEPHRINE 4 MG/250ML-% IV SOLN
0.0000 ug/min | INTRAVENOUS | Status: DC
  Filled 2024-01-10: qty 250

## 2024-01-13 ENCOUNTER — Inpatient Hospital Stay
Admission: EM | Admit: 2024-01-13 | Discharge: 2024-01-16 | DRG: 871 | Disposition: A | Discharge status: Skilled Nursing Facility | Attending: Student | Admitting: Student

## 2024-01-13 ENCOUNTER — Emergency Department

## 2024-01-13 ENCOUNTER — Encounter (HOSPITAL_COMMUNITY): Payer: Self-pay | Admitting: Anesthesiology

## 2024-01-13 ENCOUNTER — Other Ambulatory Visit: Payer: Self-pay

## 2024-01-13 DIAGNOSIS — E1129 Type 2 diabetes mellitus with other diabetic kidney complication: Secondary | ICD-10-CM

## 2024-01-13 DIAGNOSIS — Z79899 Other long term (current) drug therapy: Secondary | ICD-10-CM

## 2024-01-13 DIAGNOSIS — N186 End stage renal disease: Secondary | ICD-10-CM | POA: Diagnosis present

## 2024-01-13 DIAGNOSIS — E1122 Type 2 diabetes mellitus with diabetic chronic kidney disease: Secondary | ICD-10-CM | POA: Diagnosis present

## 2024-01-13 DIAGNOSIS — I350 Nonrheumatic aortic (valve) stenosis: Secondary | ICD-10-CM

## 2024-01-13 DIAGNOSIS — Z808 Family history of malignant neoplasm of other organs or systems: Secondary | ICD-10-CM

## 2024-01-13 DIAGNOSIS — D696 Thrombocytopenia, unspecified: Secondary | ICD-10-CM | POA: Diagnosis present

## 2024-01-13 DIAGNOSIS — Z8042 Family history of malignant neoplasm of prostate: Secondary | ICD-10-CM

## 2024-01-13 DIAGNOSIS — Z66 Do not resuscitate: Secondary | ICD-10-CM | POA: Diagnosis present

## 2024-01-13 DIAGNOSIS — B9620 Unspecified Escherichia coli [E. coli] as the cause of diseases classified elsewhere: Secondary | ICD-10-CM | POA: Diagnosis present

## 2024-01-13 DIAGNOSIS — K219 Gastro-esophageal reflux disease without esophagitis: Secondary | ICD-10-CM | POA: Diagnosis present

## 2024-01-13 DIAGNOSIS — I2510 Atherosclerotic heart disease of native coronary artery without angina pectoris: Secondary | ICD-10-CM | POA: Diagnosis present

## 2024-01-13 DIAGNOSIS — Z5982 Transportation insecurity: Secondary | ICD-10-CM

## 2024-01-13 DIAGNOSIS — J440 Chronic obstructive pulmonary disease with acute lower respiratory infection: Secondary | ICD-10-CM | POA: Diagnosis present

## 2024-01-13 DIAGNOSIS — Z880 Allergy status to penicillin: Secondary | ICD-10-CM

## 2024-01-13 DIAGNOSIS — Z87891 Personal history of nicotine dependence: Secondary | ICD-10-CM

## 2024-01-13 DIAGNOSIS — Z820 Family history of epilepsy and other diseases of the nervous system: Secondary | ICD-10-CM

## 2024-01-13 DIAGNOSIS — N2581 Secondary hyperparathyroidism of renal origin: Secondary | ICD-10-CM | POA: Diagnosis present

## 2024-01-13 DIAGNOSIS — H5462 Unqualified visual loss, left eye, normal vision right eye: Secondary | ICD-10-CM | POA: Diagnosis present

## 2024-01-13 DIAGNOSIS — I714 Abdominal aortic aneurysm, without rupture, unspecified: Secondary | ICD-10-CM | POA: Diagnosis present

## 2024-01-13 DIAGNOSIS — I132 Hypertensive heart and chronic kidney disease with heart failure and with stage 5 chronic kidney disease, or end stage renal disease: Secondary | ICD-10-CM | POA: Diagnosis present

## 2024-01-13 DIAGNOSIS — R54 Age-related physical debility: Secondary | ICD-10-CM | POA: Diagnosis present

## 2024-01-13 DIAGNOSIS — Z9049 Acquired absence of other specified parts of digestive tract: Secondary | ICD-10-CM

## 2024-01-13 DIAGNOSIS — Z7982 Long term (current) use of aspirin: Secondary | ICD-10-CM

## 2024-01-13 DIAGNOSIS — Z825 Family history of asthma and other chronic lower respiratory diseases: Secondary | ICD-10-CM

## 2024-01-13 DIAGNOSIS — R55 Syncope and collapse: Secondary | ICD-10-CM | POA: Diagnosis not present

## 2024-01-13 DIAGNOSIS — E559 Vitamin D deficiency, unspecified: Secondary | ICD-10-CM | POA: Diagnosis present

## 2024-01-13 DIAGNOSIS — J9601 Acute respiratory failure with hypoxia: Secondary | ICD-10-CM | POA: Diagnosis present

## 2024-01-13 DIAGNOSIS — Z801 Family history of malignant neoplasm of trachea, bronchus and lung: Secondary | ICD-10-CM

## 2024-01-13 DIAGNOSIS — Z8049 Family history of malignant neoplasm of other genital organs: Secondary | ICD-10-CM

## 2024-01-13 DIAGNOSIS — Z992 Dependence on renal dialysis: Secondary | ICD-10-CM

## 2024-01-13 DIAGNOSIS — J441 Chronic obstructive pulmonary disease with (acute) exacerbation: Secondary | ICD-10-CM | POA: Diagnosis present

## 2024-01-13 DIAGNOSIS — Z681 Body mass index (BMI) 19 or less, adult: Secondary | ICD-10-CM

## 2024-01-13 DIAGNOSIS — A419 Sepsis, unspecified organism: Principal | ICD-10-CM | POA: Diagnosis present

## 2024-01-13 DIAGNOSIS — E440 Moderate protein-calorie malnutrition: Secondary | ICD-10-CM | POA: Diagnosis present

## 2024-01-13 DIAGNOSIS — K5900 Constipation, unspecified: Secondary | ICD-10-CM | POA: Diagnosis not present

## 2024-01-13 DIAGNOSIS — D631 Anemia in chronic kidney disease: Secondary | ICD-10-CM | POA: Diagnosis present

## 2024-01-13 DIAGNOSIS — N390 Urinary tract infection, site not specified: Secondary | ICD-10-CM | POA: Diagnosis present

## 2024-01-13 DIAGNOSIS — S0990XA Unspecified injury of head, initial encounter: Secondary | ICD-10-CM | POA: Diagnosis present

## 2024-01-13 DIAGNOSIS — J9819 Other pulmonary collapse: Secondary | ICD-10-CM | POA: Diagnosis present

## 2024-01-13 DIAGNOSIS — J189 Pneumonia, unspecified organism: Secondary | ICD-10-CM | POA: Diagnosis present

## 2024-01-13 DIAGNOSIS — I5032 Chronic diastolic (congestive) heart failure: Secondary | ICD-10-CM | POA: Diagnosis present

## 2024-01-13 DIAGNOSIS — R079 Chest pain, unspecified: Secondary | ICD-10-CM

## 2024-01-13 DIAGNOSIS — Z8673 Personal history of transient ischemic attack (TIA), and cerebral infarction without residual deficits: Secondary | ICD-10-CM

## 2024-01-13 DIAGNOSIS — Z8249 Family history of ischemic heart disease and other diseases of the circulatory system: Secondary | ICD-10-CM

## 2024-01-13 DIAGNOSIS — W19XXXA Unspecified fall, initial encounter: Secondary | ICD-10-CM | POA: Diagnosis present

## 2024-01-13 DIAGNOSIS — Z7951 Long term (current) use of inhaled steroids: Secondary | ICD-10-CM

## 2024-01-13 LAB — URINALYSIS, W/ REFLEX TO CULTURE (INFECTION SUSPECTED)
Bilirubin Urine: NEGATIVE
Glucose, UA: NEGATIVE mg/dL
Hgb urine dipstick: NEGATIVE
Ketones, ur: NEGATIVE mg/dL
Nitrite: NEGATIVE
Protein, ur: 100 mg/dL — AB
Specific Gravity, Urine: 1.012 (ref 1.005–1.030)
WBC, UA: >50 WBC/hpf (ref 0–5)
pH: 6 (ref 5.0–8.0)

## 2024-01-13 LAB — COMPREHENSIVE METABOLIC PANEL WITH GFR
ALT: 58 U/L — ABNORMAL HIGH (ref 0–44)
AST: 38 U/L (ref 15–41)
Albumin: 3.2 g/dL — ABNORMAL LOW (ref 3.5–5.0)
Alkaline Phosphatase: 113 U/L (ref 38–126)
Anion gap: 11 (ref 5–15)
BUN: 86 mg/dL — ABNORMAL HIGH (ref 8–23)
CO2: 24 mmol/L (ref 22–32)
Calcium: 9.1 mg/dL (ref 8.9–10.3)
Chloride: 101 mmol/L (ref 98–111)
Creatinine, Ser: 4.77 mg/dL — ABNORMAL HIGH (ref 0.44–1.00)
GFR, Estimated: 10 mL/min — ABNORMAL LOW (ref >60)
Glucose, Bld: 84 mg/dL (ref 70–99)
Potassium: 5.3 mmol/L — ABNORMAL HIGH (ref 3.5–5.1)
Sodium: 136 mmol/L (ref 135–145)
Total Bilirubin: 0.9 mg/dL (ref 0.0–1.2)
Total Protein: 6.8 g/dL (ref 6.5–8.1)

## 2024-01-13 LAB — CBC WITH DIFFERENTIAL/PLATELET
Abs Immature Granulocytes: 0.02 K/uL (ref 0.00–0.07)
Basophils Absolute: 0 K/uL (ref 0.0–0.1)
Basophils Relative: 1 %
Eosinophils Absolute: 0.1 K/uL (ref 0.0–0.5)
Eosinophils Relative: 2 %
HCT: 35.1 % — ABNORMAL LOW (ref 36.0–46.0)
Hemoglobin: 10.9 g/dL — ABNORMAL LOW (ref 12.0–15.0)
Immature Granulocytes: 0 %
Lymphocytes Relative: 20 %
Lymphs Abs: 1.2 K/uL (ref 0.7–4.0)
MCH: 31.7 pg (ref 26.0–34.0)
MCHC: 31.1 g/dL (ref 30.0–36.0)
MCV: 102 fL — ABNORMAL HIGH (ref 80.0–100.0)
Monocytes Absolute: 0.6 K/uL (ref 0.1–1.0)
Monocytes Relative: 10 %
Neutro Abs: 4 K/uL (ref 1.7–7.7)
Neutrophils Relative %: 67 %
Platelets: 139 K/uL — ABNORMAL LOW (ref 150–400)
RBC: 3.44 MIL/uL — ABNORMAL LOW (ref 3.87–5.11)
RDW: 12.4 % (ref 11.5–15.5)
WBC: 5.9 K/uL (ref 4.0–10.5)
nRBC: 0 % (ref 0.0–0.2)

## 2024-01-13 LAB — LACTIC ACID, PLASMA: Lactic Acid, Venous: 0.7 mmol/L (ref 0.5–1.9)

## 2024-01-13 LAB — BRAIN NATRIURETIC PEPTIDE: B Natriuretic Peptide: 1301.5 pg/mL — ABNORMAL HIGH (ref 0.0–100.0)

## 2024-01-13 LAB — CBG MONITORING, ED: Glucose-Capillary: 83 mg/dL (ref 70–99)

## 2024-01-13 LAB — TROPONIN I (HIGH SENSITIVITY): Troponin I (High Sensitivity): 31 ng/L — ABNORMAL HIGH (ref <18)

## 2024-01-13 LAB — TSH: TSH: 1.285 u[IU]/mL (ref 0.350–4.500)

## 2024-01-13 MED ORDER — FENTANYL CITRATE PF 50 MCG/ML IJ SOSY
50.0000 ug | PREFILLED_SYRINGE | Freq: Once | INTRAMUSCULAR | Status: AC
  Administered 2024-01-13: 50 ug via INTRAVENOUS
  Filled 2024-01-13: qty 1

## 2024-01-13 MED ORDER — ONDANSETRON HCL 4 MG/2ML IJ SOLN
4.0000 mg | Freq: Once | INTRAMUSCULAR | Status: AC
  Administered 2024-01-13: 4 mg via INTRAVENOUS
  Filled 2024-01-13: qty 2

## 2024-01-13 NOTE — ED Provider Notes (Signed)
 [facility] Provider Note    Event Date/Time   First MD Initiated Contact with Patient 01/13/24 2301     (approximate)   History   Fall and Loss of Consciousness   HPI  Katie [name] is a 68 y.o. female with history of COPD, CHF, end-stage renal disease on hemodialysis, hypertension, aortic stenosis who presents to the emergency department with chest pain, shortness of breath, syncope.  States that she had chest pain, shortness of breath, felt lightheaded and then passed out.  She thinks she hit her head.  She is having headache but no neck pain.  Also complains of left upper extremity pain from her fall.  No recent vomiting, diarrhea.  No missed dialysis sessions.  Does report prior history of PE but is not on anticoagulation.  Denies calf tenderness or calf swelling.  States that she is supposed to be scheduled for an aortic valve replacement at [facility] soon due to her aortic stenosis.   History provided by patient.    Past Medical History:  Diagnosis Date   Chronic diastolic heart failure (HCC)    COPD (chronic obstructive pulmonary disease) (HCC)    Diabetes mellitus without complication (HCC)    ESRD on dialysis (HCC)    Hypertension    Severe aortic stenosis     Past Surgical History:  Procedure Laterality Date   A/V SHUNT INTERVENTION Left 12/21/2021   Procedure: A/V SHUNT INTERVENTION;  Surgeon: Marea Selinda RAMAN, MD;  Location: [facility] IN[facility]IVE [facility];  Service: Cardiovascular;  Laterality: Left;   APPENDECTOMY     AV FISTULA PLACEMENT Left 03/16/2021   Procedure: INSERTION OF ARTERIOVENOUS (AV) GORE-TEX GRAFT ARM;  Surgeon: Marea Selinda RAMAN, MD;  Location: [facility] ORS;  Service: Vascular;  Laterality: Left;   DIALYSIS/PERMA CATHETER INSERTION N/A 05/16/2020   Procedure: DIALYSIS/PERMA CATHETER INSERTION;  Surgeon: Marea Selinda RAMAN, MD;  Location: [facility];  Service: Cardiovascular;  Laterality: N/A;   DIALYSIS/PERMA CATHETER REMOVAL N/A 03/26/2022    Procedure: DIALYSIS/PERMA CATHETER REMOVAL;  Surgeon: Marea Selinda RAMAN, MD;  Location: [facility];  Service: Cardiovascular;  Laterality: N/A;   DIALYSIS/PERMA CATHETER REPAIR N/A 01/22/2022   Procedure: DIALYSIS/PERMA CATHETER INSERT;  Surgeon: Marea Selinda RAMAN, MD;  Location: [facility];  Service: Cardiovascular;  Laterality: N/A;   MANDIBLE SURGERY     RENAL BIOPSY Right    RIGHT HEART CATH N/A 05/10/2020   Procedure: RIGHT HEART CATH;  Surgeon: Mady Bruckner, MD;  Location: [facility];  Service: Cardiovascular;  Laterality: N/A;   RIGHT/LEFT HEART CATH AND CORONARY ANGIOGRAPHY Bilateral 08/15/2023   Procedure: RIGHT/LEFT HEART CATH AND CORONARY ANGIOGRAPHY;  Surgeon: Anner Alm ORN, MD;  Location: [facility];  Service: Cardiovascular;  Laterality: Bilateral;   TEMPORARY DIALYSIS CATHETER N/A 05/11/2020   Procedure: TEMPORARY DIALYSIS CATHETER;  Surgeon: Marea Selinda RAMAN, MD;  Location: [facility];  Service: Cardiovascular;  Laterality: N/A;    MEDICATIONS:  Prior to Admission medications   Medication Sig Start Date End Date Taking? Authorizing Provider  acetaminophen  (TYLENOL ) 500 MG tablet Take 500 mg by mouth 3 (three) times daily. Take additional 500 mg every 6 hours as needed for pain    [provider]  ADVAIR DISKUS 250-50 MCG/ACT AEPB Inhale 1 puff into the lungs 2 (two) times daily. 05/21/23   [provider]  albuterol  (VENTOLIN  HFA) 108 (90 Base) MCG/ACT inhaler Inhale 1 puff into the lungs every 6 (six)  hours as needed for wheezing. 01/29/22   [provider]  aspirin  EC 81 MG tablet Take 1 tablet (81 mg total) by mouth daily. Swallow whole. 06/03/23   Darliss Rogue, MD  calcium  carbonate (TUMS - DOSED IN MG ELEMENTAL CALCIUM ) 500 MG chewable tablet Chew 1 tablet by mouth 3 (three) times daily as needed (GRED).    [provider]  DULoxetine  (CYMBALTA ) 30 MG capsule Take 30 mg by mouth daily. 08/06/23    [provider]  famotidine  (PEPCID ) 20 MG tablet Take 10 mg by mouth at bedtime.    [provider]  guaifenesin  (ROBITUSSIN) 100 MG/5ML syrup Take 200 mg by mouth every 4 (four) hours as needed for cough.    [provider]  melatonin 3 MG TABS tablet Take 3 mg by mouth at bedtime.    [provider]  midodrine  (PROAMATINE ) 5 MG tablet Take 5 mg by mouth. Monday, Wednesday, Friday prior to dialysis 05/06/23   [provider]  ondansetron  (ZOFRAN ) 4 MG tablet Take 4 mg by mouth every Monday, Wednesday, and Friday. 04/27/23   [provider]  ondansetron  (ZOFRAN -ODT) 4 MG disintegrating tablet Take 1 tablet (4 mg total) by mouth every 8 (eight) hours as needed for nausea or vomiting. 12/16/23   Suzanne Kirsch, MD  oxyCODONE  (OXY IR/ROXICODONE ) 5 MG immediate release tablet Take 5 mg by mouth every 6 (six) hours.    [provider]  pantoprazole  (PROTONIX ) 40 MG tablet Take 1 tablet (40 mg total) by mouth 2 (two) times daily. 11/22/21 01/02/24  Raenelle Coria, MD  Polyethylene Glycol 3350  POWD Take 1 Capful by mouth daily. 17 g    [provider]  pramoxine (SARNA SENSITIVE) 1 % LOTN Apply 1 Application topically 2 (two) times daily.    [provider]  Psyllium (REGULOID PO) Take 0.4 g/1.7m2 by mouth 2 (two) times daily.    [provider]  saccharomyces boulardii (FLORASTOR) 250 MG capsule Take 250 mg by mouth daily.    [provider]  senna (SENOKOT) 8.6 MG TABS tablet Take 1 tablet by mouth at bedtime.    [provider]  senna-docusate (SENOKOT-S) 8.6-50 MG tablet Take 1 tablet by mouth 2 (two) times daily as needed for mild constipation. 12/25/21   Fausto Burnard LABOR, DO  topiramate  (TOPAMAX ) 50 MG tablet Take 1 tablet (50 mg total) by mouth 2 (two) times daily as needed (headache). 03/21/21   Jhonny Calvin NOVAK, MD  traZODone  (DESYREL ) 50 MG tablet Take 1 tablet (50 mg total) by mouth  every evening. 07/02/20 01/02/24  Dickie Begun, MD    Physical Exam   Triage Vital Signs: ED Triage Vitals  Encounter Vitals Group     BP 01/13/24 2251 (!) 175/70     Girls Systolic BP Percentile --      Girls Diastolic BP Percentile --      Boys Systolic BP Percentile --      Boys Diastolic BP Percentile --      Pulse Rate 01/13/24 2251 98     Resp 01/13/24 2251 16     Temp 01/13/24 2251 99.1 F (37.3 C)     Temp Source 01/13/24 2251 Oral     SpO2 01/13/24 2251 99 %     Weight 01/13/24 2253 132 lb (59.9 kg)     Height 01/13/24 2253 5' 6 (1.676 m)     Head Circumference --      Peak Flow --  Pain Score --      Pain Loc --      Pain Education --      Exclude from Growth Chart --     Most recent vital signs: Vitals:   01/14/24 0130 01/14/24 0205  BP: (!) 170/61 137/69  Pulse: (!) 101 99  Resp: 18 19  Temp:    SpO2: 97% 100%     CONSTITUTIONAL: Alert and responds appropriately to questions.  Chronically ill-appearing HEAD: Normocephalic, atraumatic EYES: Conjunctivae clear, pupils appear equal ENT: normal nose; moist mucous membranes NECK: Normal range of motion, no midline spinal tenderness or step-off or deformity CARD: Regular rate and rhythm but intermittently will become very tachycardic in the 140s and a sinus rhythm, positive systolic murmur RESP: Normal chest excursion without splinting or tachypnea; no hypoxia or respiratory distress, speaking full sentences, lungs clear to auscultation ABD/GI: non-distended, nontender to palpation diffusely EXT: Normal ROM in all joints, no major deformities noted, tender over the left elbow, forearm, wrist without deformity, ecchymosis.  Compartments soft.  No calf tenderness or calf swelling.  2+ left radial pulse. SKIN: Normal color for age and race, no rashes on exposed skin NEURO: Moves all extremities equally, normal speech, no facial asymmetry noted PSYCH: The patient's mood and manner are appropriate. Grooming and  personal hygiene are appropriate.  ED Results / Procedures / Treatments   LABS: (all labs ordered are listed, but only abnormal results are displayed) Labs Reviewed  CBC WITH DIFFERENTIAL/PLATELET - Abnormal; Notable for the following components:      Result Value   RBC 3.44 (*)    Hemoglobin 10.9 (*)    HCT 35.1 (*)    MCV 102.0 (*)    Platelets 139 (*)    All other components within normal limits  COMPREHENSIVE METABOLIC PANEL WITH GFR - Abnormal; Notable for the following components:   Potassium 5.3 (*)    BUN 86 (*)    Creatinine, Ser 4.77 (*)    Albumin  3.2 (*)    ALT 58 (*)    GFR, Estimated 10 (*)    All other components within normal limits  URINALYSIS, W/ REFLEX TO CULTURE (INFECTION SUSPECTED) - Abnormal; Notable for the following components:   Color, Urine AMBER (*)    APPearance CLOUDY (*)    Protein, ur 100 (*)    Leukocytes,Ua LARGE (*)    Bacteria, UA MANY (*)    All other components within normal limits  BRAIN NATRIURETIC PEPTIDE - Abnormal; Notable for the following components:   B Natriuretic Peptide 1,301.5 (*)    All other components within normal limits  TROPONIN I (HIGH SENSITIVITY) - Abnormal; Notable for the following components:   Troponin I (High Sensitivity) 31 (*)    All other components within normal limits  TROPONIN I (HIGH SENSITIVITY) - Abnormal; Notable for the following components:   Troponin I (High Sensitivity) 23 (*)    All other components within normal limits  CULTURE, BLOOD (SINGLE)  URINE CULTURE  TSH  LACTIC ACID, PLASMA  PROTIME-INR  CORTISOL-AM, BLOOD  BASIC METABOLIC PANEL WITH GFR  CBC  PROCALCITONIN  CBG MONITORING, ED     EKG:    Date: 01/13/2024  Rate: 98  Rhythm: normal sinus rhythm  QRS Axis: normal  Intervals: normal  ST/T Wave abnormalities: ST depression in lateral leads  Conduction Disutrbances: none  Narrative Interpretation: LVH, ST depression in lateral leads      RADIOLOGY: My personal  review and interpretation of imaging:  CT head and cervical spine show no acute traumatic injury.  CTA of the chest shows collapse of the left lower lobe.  No PE.  I have personally reviewed all radiology reports. CT HEAD WO CONTRAST ( ) Result Date: 01/14/2024 CLINICAL DATA:  Neck trauma (Age >= 65y); Head trauma, minor (Age >= 65y). Dizziness, syncope, fall EXAM: CT HEAD WITHOUT CONTRAST CT CERVICAL SPINE WITHOUT CONTRAST TECHNIQUE: Multidetector CT imaging of the head and cervical spine was performed following the standard protocol without intravenous contrast. Multiplanar CT image reconstructions of the cervical spine were also generated. RADIATION DOSE REDUCTION: This exam was performed according to the departmental dose-optimization program which includes automated exposure control, adjustment of the mA and/or kV according to patient size and/or use of iterative reconstruction technique. COMPARISON:  None Available. FINDINGS: CT HEAD FINDINGS Brain: Normal anatomic configuration. Parenchymal volume loss is commensurate with the patient's age. Mild periventricular white matter changes are present likely reflecting the sequela of small vessel ischemia. No abnormal intra or extra-axial mass lesion or fluid collection. No abnormal mass effect or midline shift. No evidence of acute intracranial hemorrhage or infarct. Ventricular size is normal. Cerebellum unremarkable. Vascular: No asymmetric hyperdense vasculature at the skull base. Skull: Intact Sinuses/Orbits: Paranasal sinuses are clear. Orbits are unremarkable. Other: Mastoid air cells and middle ear cavities are clear. CT CERVICAL SPINE FINDINGS Alignment: 3 mm retrolisthesis C6-7. Otherwise straightening of the cervical spine. Skull base and vertebrae: Craniocervical alignment is normal. The atlantodental interval is not widened. No acute fracture of the cervical spine. Vertebral body height is preserved. Degenerative ankylosis of C6-7 vertebral  bodies. Soft tissues and spinal canal: No prevertebral fluid or swelling. No visible canal hematoma. Broad-based posterior disc osteophyte complex at C5-6 results in moderate central canal stenosis with mild flattening of the thecal sac and AP diameter of the spinal canal of 7 mm. No high-grade canal stenosis. Advanced vascular calcifications are seen within the carotid bifurcations. Disc levels: Disc space narrowing and endplate remodeling is seen at C5-6 in keeping with changes of advanced degenerative disc disease. Degenerative ankylosis and obliteration of the C6-7 disc space. Milder degenerative changes noted at C4-5. Prevertebral soft tissues are not thickened on sagittal reformats. No significant neuroforaminal narrowing. Upper chest: Negative. Other: None IMPRESSION: 1. No acute intracranial abnormality. No calvarial fracture. 2. No acute fracture or listhesis of the cervical spine. 3. 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 4. Advanced vascular calcifications within the carotid bifurcations. Electronically Signed   By: Dorethia Molt M.D.   On: 01/14/2024 00:49   CT Cervical Spine Wo Contrast Result Date: 01/14/2024 CLINICAL DATA:  Neck trauma (Age >= 65y); Head trauma, minor (Age >= 65y). Dizziness, syncope, fall EXAM: CT HEAD WITHOUT CONTRAST CT CERVICAL SPINE WITHOUT CONTRAST TECHNIQUE: Multidetector CT imaging of the head and cervical spine was performed following the standard protocol without intravenous contrast. Multiplanar CT image reconstructions of the cervical spine were also generated. RADIATION DOSE REDUCTION: This exam was performed according to the departmental dose-optimization program which includes automated exposure control, adjustment of the mA and/or kV according to patient size and/or use of iterative reconstruction technique. COMPARISON:  None Available. FINDINGS: CT HEAD FINDINGS Brain: Normal anatomic configuration.  Parenchymal volume loss is commensurate with the patient's age. Mild periventricular white matter changes are present likely reflecting the sequela of small vessel ischemia. No abnormal intra or extra-axial mass lesion or fluid collection. No abnormal mass effect or midline shift. No evidence  of acute intracranial hemorrhage or infarct. Ventricular size is normal. Cerebellum unremarkable. Vascular: No asymmetric hyperdense vasculature at the skull base. Skull: Intact Sinuses/Orbits: Paranasal sinuses are clear. Orbits are unremarkable. Other: Mastoid air cells and middle ear cavities are clear. CT CERVICAL SPINE FINDINGS Alignment: 3 mm retrolisthesis C6-7. Otherwise straightening of the cervical spine. Skull base and vertebrae: Craniocervical alignment is normal. The atlantodental interval is not widened. No acute fracture of the cervical spine. Vertebral body height is preserved. Degenerative ankylosis of C6-7 vertebral bodies. Soft tissues and spinal canal: No prevertebral fluid or swelling. No visible canal hematoma. Broad-based posterior disc osteophyte complex at C5-6 results in moderate central canal stenosis with mild flattening of the thecal sac and AP diameter of the spinal canal of 7 mm. No high-grade canal stenosis. Advanced vascular calcifications are seen within the carotid bifurcations. Disc levels: Disc space narrowing and endplate remodeling is seen at C5-6 in keeping with changes of advanced degenerative disc disease. Degenerative ankylosis and obliteration of the C6-7 disc space. Milder degenerative changes noted at C4-5. Prevertebral soft tissues are not thickened on sagittal reformats. No significant neuroforaminal narrowing. Upper chest: Negative. Other: None IMPRESSION: 1. No acute intracranial abnormality. No calvarial fracture. 2. No acute fracture or listhesis of the cervical spine. 3. 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 4.  Advanced vascular calcifications within the carotid bifurcations. Electronically Signed   By: Dorethia Molt M.D.   On: 01/14/2024 00:49   CT Angio Chest PE W and/or Wo Contrast Result Date: 01/14/2024 CLINICAL DATA:  High probability pulmonary embolism, syncope EXAM: CT ANGIOGRAPHY CHEST WITH CONTRAST TECHNIQUE: Multidetector CT imaging of the chest was performed using the standard protocol during bolus administration of intravenous contrast. Multiplanar CT image reconstructions and MIPs were obtained to evaluate the vascular anatomy. RADIATION DOSE REDUCTION: This exam was performed according to the departmental dose-optimization program which includes automated exposure control, adjustment of the mA and/or kV according to patient size and/or use of iterative reconstruction technique. CONTRAST:  75mL OMNIPAQUE  IOHEXOL  350 MG/ML SOLN COMPARISON:  11/12/2023 FINDINGS: Cardiovascular: Advanced aortic and mitral valvular calcification. Extensive multi-vessel coronary artery calcification. Cardiac size is mildly enlarged. Adequate opacification of the pulmonary arterial tree. No intraluminal filling defect identified to suggest acute pulmonary embolism. Central pulmonary arteries are of normal caliber. No pericardial effusion. Mild atherosclerotic calcification within the thoracic aorta. Stable fusiform dilation of the ascending aorta measuring 4.1 cm in maximal diameter. Mediastinum/Nodes: No enlarged mediastinal, hilar, or axillary lymph nodes. Thyroid gland, trachea, and esophagus demonstrate no significant findings. Lungs/Pleura: Mosaic attenuation of the pulmonary parenchyma is in keeping with multifocal air trapping related to small airways disease. There is extensive airway impaction left lower lobe with complete collapse of the left lower lobe. Mild right basilar dependent atelectasis. No pneumothorax. Trace right pleural effusion Upper Abdomen: Cholelithiasis without pericholecystic inflammatory change. No  acute abnormality within the visualized upper abdomen. Musculoskeletal: No chest wall abnormality. No acute or significant osseous findings. Review of the MIP images confirms the above findings. IMPRESSION: 1. No pulmonary embolism. 2. Extensive airway impaction with complete collapse of the left lower lobe. 3. 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 4. Extensive multi-vessel coronary artery calcification. 5. Advanced aortic and mitral valvular calcification. Echocardiography may be helpful to assess the degree of valvular dysfunction 6. Stable fusiform dilation of the ascending aorta measuring 4.1 cm in maximal diameter. Recommend annual  imaging followup by CTA or MRA. This recommendation follows 2010 ACCF/AHA/AATS/ACR/ASA/SCA/SCAI/SIR/STS/SVM Guidelines for the Diagnosis and Management of Patients with Thoracic Aortic Disease. Circulation. 2010; 121: [id number]. Aortic aneurysm NOS (ICD10-I71.9) 7. Cholelithiasis. 8. Aortic atherosclerosis. Aortic Atherosclerosis (ICD10-I70.0). Electronically Signed   By: Dorethia Molt M.D.   On: 01/14/2024 00:39   DG Elbow Complete Left Result Date: 01/14/2024 CLINICAL DATA:  Fall, left elbow injury EXAM: LEFT ELBOW - COMPLETE 3+ VIEW COMPARISON:  None Available. FINDINGS: There is no evidence of fracture, dislocation, or joint effusion. There is no evidence of arthropathy or other focal bone abnormality. Vascular calcifications are noted. IMPRESSION: 1. Negative. Electronically Signed   By: Dorethia Molt M.D.   On: 01/14/2024 00:20   DG Wrist Complete Left Result Date: 01/14/2024 CLINICAL DATA:  Chest pain, dyspnea EXAM: LEFT WRIST - COMPLETE 3+ VIEW COMPARISON:  None Available. FINDINGS: Normal alignment. No acute fracture or dislocation. Mild polyarticular degenerative changes. Advanced vascular calcifications are noted. IMPRESSION: 1. Mild polyarticular degenerative change. Electronically  Signed   By: Dorethia Molt M.D.   On: 01/14/2024 00:19   DG Hand Complete Left Result Date: 01/14/2024 CLINICAL DATA:  Fall, left hand injury EXAM: LEFT HAND - COMPLETE 3+ VIEW COMPARISON:  None Available. FINDINGS: There is no evidence of fracture or dislocation. There is no evidence of arthropathy or other focal bone abnormality. Vascular calcifications are noted. IMPRESSION: 1. Negative. Electronically Signed   By: Dorethia Molt M.D.   On: 01/14/2024 00:16     PROCEDURES:  Critical Care performed: Yes, see critical care procedure note(s)   CRITICAL CARE Performed by: Josette Cortland Crehan   Total critical care time: 30 minutes  Critical care time was exclusive of separately billable procedures and treating other patients.  Critical care was necessary to treat or prevent imminent or life-threatening deterioration.  Critical care was time spent personally by me on the following activities: development of treatment plan with patient and/or surrogate as well as nursing, discussions with consultants, evaluation of patient's response to treatment, examination of patient, obtaining history from patient or surrogate, ordering and performing treatments and interventions, ordering and review of laboratory studies, ordering and review of radiographic studies, pulse oximetry and re-evaluation of patient's condition.   SABRA1-3 Lead EKG Interpretation  Performed by: Habib Kise, Josette SAILOR, DO Authorized by: Isela Stantz, Josette SAILOR, DO     Interpretation: abnormal     ECG rate:  101   ECG rate assessment: tachycardic     Rhythm: sinus tachycardia     Ectopy: none     Conduction: normal       IMPRESSION / MDM / ASSESSMENT AND PLAN / ED COURSE  I reviewed the triage vital signs and the nursing notes.   Patient here with syncope, head injury, left arm pain from fall, chest pain and shortness of breath.  Feels warm to touch and is intermittently tachycardic into the 140s and a sinus rhythm.  The patient is on the  cardiac monitor to evaluate for evidence of arrhythmia and/or significant heart rate changes.   DIFFERENTIAL DIAGNOSIS (includes but not limited to):   ACS, PE, severe aortic stenosis, CHF exacerbation, pneumonia, UTI, dehydration, electrolyte derangement, thyroid dysfunction, intracranial hemorrhage, anemia, low suspicion for dissection, meningitis, stroke  Patient's presentation is most consistent with acute presentation with potential threat to life or bodily function.  PLAN: Will obtain EKG, CT of the head and cervical spine, labs, urine.  She feels warm to touch with initial temperature of 99.1.  Will recheck.  Also obtain CTA of the chest given she reports prior history of PE and then has tachycardia today with syncope, chest pain and shortness of breath.  Will x-ray her left upper extremity.  Will give pain medication.   MEDICATIONS GIVEN IN ED: Medications  aspirin  EC tablet 81 mg (has no administration in time range)  oxyCODONE  (Oxy IR/ROXICODONE ) immediate release tablet 5 mg (has no administration in time range)  midodrine  (PROAMATINE ) tablet 5 mg (has no administration in time range)  DULoxetine  (CYMBALTA ) DR capsule 30 mg (has no administration in time range)  traZODone  (DESYREL ) tablet 50 mg (has no administration in time range)  calcium  carbonate (TUMS - dosed in mg elemental calcium ) chewable tablet 200 mg of elemental calcium  (has no administration in time range)  famotidine  (PEPCID ) tablet 10 mg (has no administration in time range)  pantoprazole  (PROTONIX ) EC tablet 40 mg (has no administration in time range)  saccharomyces boulardii (FLORASTOR) capsule 250 mg (has no administration in time range)  senna (SENOKOT) tablet 8.6 mg (has no administration in time range)  melatonin tablet 3 mg (has no administration in time range)  Polyethylene Glycol 3350  POWD 1 Capful (has no administration in time range)  topiramate  (TOPAMAX ) tablet 50 mg (has no administration in time range)   lactated ringers  infusion (150 mL/hr Intravenous New Bag/Given 01/14/24 0159)  enoxaparin  (LOVENOX ) injection 40 mg (has no administration in time range)  cefTRIAXone  (ROCEPHIN ) 2 g in sodium chloride  0.9 % 100 mL IVPB (has no administration in time range)  azithromycin  (ZITHROMAX ) 500 mg in sodium chloride  0.9 % 250 mL IVPB (has no administration in time range)  acetaminophen  (TYLENOL ) tablet 650 mg (has no administration in time range)    Or  acetaminophen  (TYLENOL ) suppository 650 mg (has no administration in time range)  traZODone  (DESYREL ) tablet 25 mg (has no administration in time range)  magnesium  hydroxide (MILK OF MAGNESIA) suspension 30 mL (has no administration in time range)  ondansetron  (ZOFRAN ) tablet 4 mg (has no administration in time range)    Or  ondansetron  (ZOFRAN ) injection 4 mg (has no administration in time range)  guaiFENesin  (MUCINEX ) 12 hr tablet 600 mg (600 mg Oral Given 01/14/24 0152)  chlorpheniramine-HYDROcodone  (TUSSIONEX) 10-8 MG/5ML suspension 5 mL (has no administration in time range)  ipratropium-albuterol  (DUONEB) 0.5-2.5 (3) MG/3ML nebulizer solution 3 mL (has no administration in time range)  oxyCODONE  (OXYCONTIN ) 12 hr tablet 10 mg (10 mg Oral Given 01/14/24 0152)  ondansetron  (ZOFRAN ) injection 4 mg (4 mg Intravenous Given 01/13/24 2334)  fentaNYL  (SUBLIMAZE ) injection 50 mcg (50 mcg Intravenous Given 01/13/24 2335)  iohexol  (OMNIPAQUE ) 350 MG/ML injection 75 mL (75 mLs Intravenous Contrast Given 01/14/24 0014)  cefTRIAXone  (ROCEPHIN ) 2 g in sodium chloride  0.9 % 100 mL IVPB (0 g Intravenous Stopped 01/14/24 0123)  azithromycin  (ZITHROMAX ) 500 mg in sodium chloride  0.9 % 250 mL IVPB (0 mg Intravenous Stopped 01/14/24 0233)  ipratropium-albuterol  (DUONEB) 0.5-2.5 (3) MG/3ML nebulizer solution 3 mL (3 mLs Nebulization Given 01/14/24 0125)  oxyCODONE  (Oxy IR/ROXICODONE ) immediate release tablet 5 mg (5 mg Oral Given 01/14/24 0124)     ED COURSE: Labs showed no  leukocytosis.  Stable hemoglobin of 10.9.  Normal blood glucose.  Normal TSH.  Slightly elevated potassium at 5.3.  No QT prolongation, QRS widening or peaked T waves on EKG.  Troponin slightly elevated at 31 which appears to be near her baseline.  Second troponin is pending.  Her BNP is elevated at 1300.    Urine appears  infected.  Culture pending.  Blood cultures also pending.  Will give Rocephin .  CT of the head and cervical spine reviewed and interpreted by myself and the radiologist as well as left upper extremity x-rays and showed no acute traumatic injury.  CTA of the chest shows no PE but does show extensive airway impaction with complete collapse of the left lower lobe.  Will add on azithromycin  to cover for pneumonia.  Will give breathing treatments.  Will discuss with hospitalist for admission.   CONSULTS:  Consulted and discussed patient's case with hjospitalist, Dr. Lawence.  I have recommended admission and consulting physician agrees and will place admission orders.  Patient (and family if present) agree with this plan.   I reviewed all nursing notes, vitals, pertinent previous records.  All labs, EKGs, imaging ordered have been independently reviewed and interpreted by myself.    OUTSIDE RECORDS REVIEWED: Reviewed recent cardiology notes.     FINAL CLINICAL IMPRESSION(S) / ED DIAGNOSES   Final diagnoses:  Syncope, unspecified syncope type  Acute UTI  Injury of head, initial encounter  Chest pain, unspecified type  Aortic stenosis, severe     Rx / DC Orders   ED Discharge Orders     None        Note:  This document was prepared using Dragon voice recognition software and may include unintentional dictation errors.   Montavious Wierzba, Josette SAILOR, DO 01/14/24 [phone]

## 2024-01-13 NOTE — ED Triage Notes (Addendum)
 Pt arrived to ED via [facility] for a syncopal episode at the [facility] where she lives. Pt states she got dizzy, fell, and had + LOC. Blood glucose with EMS was 87. Denies any SOB, CP. Pt states she is hurting on her right elbow where a skin tear happened. Pt currently A&Ox4. EMS showed Sinus tachy on 12 lead. 100% RA. Pt gets dialysis mon, wed, Friday.

## 2024-01-14 ENCOUNTER — Encounter (HOSPITAL_COMMUNITY): Payer: Self-pay | Admitting: Anesthesiology

## 2024-01-14 ENCOUNTER — Other Ambulatory Visit (HOSPITAL_COMMUNITY)

## 2024-01-14 ENCOUNTER — Inpatient Hospital Stay (HOSPITAL_COMMUNITY): Admission: RE | Admit: 2024-01-14 | Source: Home / Self Care | Admitting: Internal Medicine

## 2024-01-14 ENCOUNTER — Inpatient Hospital Stay
Admit: 2024-01-14 | Discharge: 2024-01-14 | Disposition: A | Discharge status: Home / Self Care | Attending: Student | Admitting: Student

## 2024-01-14 ENCOUNTER — Emergency Department

## 2024-01-14 ENCOUNTER — Encounter (HOSPITAL_COMMUNITY): Admission: RE | Payer: Self-pay | Source: Home / Self Care

## 2024-01-14 ENCOUNTER — Other Ambulatory Visit: Payer: Self-pay

## 2024-01-14 DIAGNOSIS — I2510 Atherosclerotic heart disease of native coronary artery without angina pectoris: Secondary | ICD-10-CM | POA: Diagnosis present

## 2024-01-14 DIAGNOSIS — J440 Chronic obstructive pulmonary disease with acute lower respiratory infection: Secondary | ICD-10-CM | POA: Diagnosis present

## 2024-01-14 DIAGNOSIS — E440 Moderate protein-calorie malnutrition: Secondary | ICD-10-CM | POA: Diagnosis present

## 2024-01-14 DIAGNOSIS — Z992 Dependence on renal dialysis: Secondary | ICD-10-CM

## 2024-01-14 DIAGNOSIS — I5032 Chronic diastolic (congestive) heart failure: Secondary | ICD-10-CM | POA: Diagnosis present

## 2024-01-14 DIAGNOSIS — K219 Gastro-esophageal reflux disease without esophagitis: Secondary | ICD-10-CM | POA: Diagnosis present

## 2024-01-14 DIAGNOSIS — D631 Anemia in chronic kidney disease: Secondary | ICD-10-CM | POA: Diagnosis present

## 2024-01-14 DIAGNOSIS — J9601 Acute respiratory failure with hypoxia: Secondary | ICD-10-CM | POA: Diagnosis present

## 2024-01-14 DIAGNOSIS — I10 Essential (primary) hypertension: Secondary | ICD-10-CM | POA: Diagnosis not present

## 2024-01-14 DIAGNOSIS — N2581 Secondary hyperparathyroidism of renal origin: Secondary | ICD-10-CM | POA: Diagnosis present

## 2024-01-14 DIAGNOSIS — J441 Chronic obstructive pulmonary disease with (acute) exacerbation: Secondary | ICD-10-CM | POA: Diagnosis present

## 2024-01-14 DIAGNOSIS — A419 Sepsis, unspecified organism: Secondary | ICD-10-CM | POA: Diagnosis present

## 2024-01-14 DIAGNOSIS — N186 End stage renal disease: Secondary | ICD-10-CM

## 2024-01-14 DIAGNOSIS — I350 Nonrheumatic aortic (valve) stenosis: Secondary | ICD-10-CM

## 2024-01-14 DIAGNOSIS — Z7951 Long term (current) use of inhaled steroids: Secondary | ICD-10-CM | POA: Diagnosis not present

## 2024-01-14 DIAGNOSIS — I132 Hypertensive heart and chronic kidney disease with heart failure and with stage 5 chronic kidney disease, or end stage renal disease: Secondary | ICD-10-CM | POA: Diagnosis present

## 2024-01-14 DIAGNOSIS — W19XXXA Unspecified fall, initial encounter: Secondary | ICD-10-CM | POA: Diagnosis present

## 2024-01-14 DIAGNOSIS — Z681 Body mass index (BMI) 19 or less, adult: Secondary | ICD-10-CM | POA: Diagnosis not present

## 2024-01-14 DIAGNOSIS — Z66 Do not resuscitate: Secondary | ICD-10-CM | POA: Diagnosis present

## 2024-01-14 DIAGNOSIS — J189 Pneumonia, unspecified organism: Secondary | ICD-10-CM

## 2024-01-14 DIAGNOSIS — R55 Syncope and collapse: Secondary | ICD-10-CM | POA: Diagnosis present

## 2024-01-14 DIAGNOSIS — D696 Thrombocytopenia, unspecified: Secondary | ICD-10-CM | POA: Diagnosis present

## 2024-01-14 DIAGNOSIS — I714 Abdominal aortic aneurysm, without rupture, unspecified: Secondary | ICD-10-CM | POA: Diagnosis present

## 2024-01-14 DIAGNOSIS — E1122 Type 2 diabetes mellitus with diabetic chronic kidney disease: Secondary | ICD-10-CM

## 2024-01-14 DIAGNOSIS — J9600 Acute respiratory failure, unspecified whether with hypoxia or hypercapnia: Secondary | ICD-10-CM | POA: Diagnosis not present

## 2024-01-14 DIAGNOSIS — N390 Urinary tract infection, site not specified: Secondary | ICD-10-CM | POA: Diagnosis present

## 2024-01-14 DIAGNOSIS — J9819 Other pulmonary collapse: Secondary | ICD-10-CM | POA: Diagnosis present

## 2024-01-14 DIAGNOSIS — S0990XA Unspecified injury of head, initial encounter: Secondary | ICD-10-CM | POA: Diagnosis present

## 2024-01-14 DIAGNOSIS — E1129 Type 2 diabetes mellitus with other diabetic kidney complication: Secondary | ICD-10-CM

## 2024-01-14 LAB — BASIC METABOLIC PANEL WITH GFR
Anion gap: 9 (ref 5–15)
BUN: 85 mg/dL — ABNORMAL HIGH (ref 8–23)
CO2: 24 mmol/L (ref 22–32)
Calcium: 8.6 mg/dL — ABNORMAL LOW (ref 8.9–10.3)
Chloride: 102 mmol/L (ref 98–111)
Creatinine, Ser: 4.6 mg/dL — ABNORMAL HIGH (ref 0.44–1.00)
GFR, Estimated: 10 mL/min — ABNORMAL LOW (ref >60)
Glucose, Bld: 152 mg/dL — ABNORMAL HIGH (ref 70–99)
Potassium: 4.5 mmol/L (ref 3.5–5.1)
Sodium: 135 mmol/L (ref 135–145)

## 2024-01-14 LAB — CBC
HCT: 30 % — ABNORMAL LOW (ref 36.0–46.0)
Hemoglobin: 9.6 g/dL — ABNORMAL LOW (ref 12.0–15.0)
MCH: 32.2 pg (ref 26.0–34.0)
MCHC: 32 g/dL (ref 30.0–36.0)
MCV: 100.7 fL — ABNORMAL HIGH (ref 80.0–100.0)
Platelets: 120 K/uL — ABNORMAL LOW (ref 150–400)
RBC: 2.98 MIL/uL — ABNORMAL LOW (ref 3.87–5.11)
RDW: 12.4 % (ref 11.5–15.5)
WBC: 5 K/uL (ref 4.0–10.5)
nRBC: 0 % (ref 0.0–0.2)

## 2024-01-14 LAB — VITAMIN B12: Vitamin B-12: 427 pg/mL (ref 180–914)

## 2024-01-14 LAB — PROTIME-INR
INR: 1 (ref 0.8–1.2)
Prothrombin Time: 14.2 s (ref 11.4–15.2)

## 2024-01-14 LAB — GLUCOSE, CAPILLARY
Glucose-Capillary: 121 mg/dL — ABNORMAL HIGH (ref 70–99)
Glucose-Capillary: 110 mg/dL — ABNORMAL HIGH (ref 70–99)

## 2024-01-14 LAB — IRON AND TIBC
Iron: 51 ug/dL (ref 28–170)
Saturation Ratios: 29 % (ref 10.4–31.8)
TIBC: 178 ug/dL — ABNORMAL LOW (ref 250–450)
UIBC: 127 ug/dL

## 2024-01-14 LAB — CORTISOL-AM, BLOOD: Cortisol - AM: 10.1 ug/dL (ref 6.7–22.6)

## 2024-01-14 LAB — VITAMIN D 25 HYDROXY (VIT D DEFICIENCY, FRACTURES): Vit D, 25-Hydroxy: 10.35 ng/mL — ABNORMAL LOW (ref 30–100)

## 2024-01-14 LAB — STREP PNEUMONIAE URINARY ANTIGEN: Strep Pneumo Urinary Antigen: NEGATIVE

## 2024-01-14 LAB — FOLATE: Folate: 10.5 ng/mL (ref >5.9)

## 2024-01-14 LAB — TROPONIN I (HIGH SENSITIVITY): Troponin I (High Sensitivity): 23 ng/L — ABNORMAL HIGH (ref <18)

## 2024-01-14 LAB — PROCALCITONIN: Procalcitonin: 1.86 ng/mL

## 2024-01-14 LAB — HEPATITIS B SURFACE ANTIGEN: Hepatitis B Surface Ag: NONREACTIVE

## 2024-01-14 SURGERY — TRANSCATHETER AORTIC VALVE REPLACEMENT, TRANSFEMORAL (CATHLAB)
Anesthesia: Monitor Anesthesia Care

## 2024-01-14 MED ORDER — MAGNESIUM HYDROXIDE 400 MG/5ML PO SUSP: 30.0000 mL | Freq: Every day | ORAL | Status: DC | PRN

## 2024-01-14 MED ORDER — MELATONIN 5 MG PO TABS
2.5000 mg | ORAL_TABLET | Freq: Every day | ORAL | Status: DC
  Administered 2024-01-14 – 2024-01-15 (×2): 2.5 mg via ORAL
  Filled 2024-01-14 (×2): qty 1

## 2024-01-14 MED ORDER — ONDANSETRON HCL 4 MG PO TABS: 4.0000 mg | ORAL_TABLET | Freq: Four times a day (QID) | ORAL | Status: DC | PRN

## 2024-01-14 MED ORDER — ONDANSETRON HCL 4 MG/2ML IJ SOLN
4.0000 mg | Freq: Four times a day (QID) | INTRAMUSCULAR | Status: DC | PRN
  Administered 2024-01-14 – 2024-01-15 (×2): 4 mg via INTRAVENOUS
  Filled 2024-01-14: qty 2

## 2024-01-14 MED ORDER — SODIUM CHLORIDE 0.9 % IV SOLN
500.0000 mg | Freq: Once | INTRAVENOUS | Status: AC
  Administered 2024-01-14: 500 mg via INTRAVENOUS
  Filled 2024-01-14: qty 5

## 2024-01-14 MED ORDER — ENOXAPARIN SODIUM 40 MG/0.4ML IJ SOSY: 40.0000 mg | PREFILLED_SYRINGE | INTRAMUSCULAR | Status: DC

## 2024-01-14 MED ORDER — LACTATED RINGERS IV SOLN
150.0000 mL/h | INTRAVENOUS | Status: DC
  Administered 2024-01-14: 150 mL/h via INTRAVENOUS

## 2024-01-14 MED ORDER — SENNA 8.6 MG PO TABS: 1.0000 | ORAL_TABLET | Freq: Every day | ORAL | Status: DC

## 2024-01-14 MED ORDER — SODIUM CHLORIDE 0.9 % IV SOLN
2.0000 g | Freq: Once | INTRAVENOUS | Status: AC
  Administered 2024-01-14: 2 g via INTRAVENOUS
  Filled 2024-01-14: qty 20

## 2024-01-14 MED ORDER — TOPIRAMATE 25 MG PO TABS
50.0000 mg | ORAL_TABLET | Freq: Two times a day (BID) | ORAL | Status: DC | PRN
  Administered 2024-01-14: 50 mg via ORAL
  Filled 2024-01-14 (×3): qty 2

## 2024-01-14 MED ORDER — POLYETHYLENE GLYCOL 3350 POWD: 1.0000 | Freq: Every day | Status: DC

## 2024-01-14 MED ORDER — IOHEXOL 350 MG/ML SOLN
75.0000 mL | Freq: Once | INTRAVENOUS | Status: AC | PRN
  Administered 2024-01-14: 75 mL via INTRAVENOUS

## 2024-01-14 MED ORDER — HYDROCOD POLI-CHLORPHE POLI ER 10-8 MG/5ML PO SUER
5.0000 mL | Freq: Two times a day (BID) | ORAL | Status: DC | PRN
  Administered 2024-01-14: 5 mL via ORAL
  Filled 2024-01-14: qty 5

## 2024-01-14 MED ORDER — ASPIRIN 81 MG PO TBEC
81.0000 mg | DELAYED_RELEASE_TABLET | Freq: Every day | ORAL | Status: DC
  Administered 2024-01-14 – 2024-01-16 (×3): 81 mg via ORAL
  Filled 2024-01-14 (×3): qty 1

## 2024-01-14 MED ORDER — TRAZODONE HCL 50 MG PO TABS
75.0000 mg | ORAL_TABLET | Freq: Every day | ORAL | Status: DC
  Administered 2024-01-14 – 2024-01-15 (×2): 75 mg via ORAL
  Filled 2024-01-14 (×2): qty 2

## 2024-01-14 MED ORDER — TRAZODONE HCL 50 MG PO TABS: 25.0000 mg | ORAL_TABLET | Freq: Every evening | ORAL | Status: DC | PRN

## 2024-01-14 MED ORDER — CHLORHEXIDINE GLUCONATE CLOTH 2 % EX PADS
6.0000 | MEDICATED_PAD | Freq: Every day | CUTANEOUS | Status: DC
  Administered 2024-01-14 – 2024-01-16 (×3): 6 via TOPICAL
  Filled 2024-01-14: qty 6

## 2024-01-14 MED ORDER — METHYLPREDNISOLONE SODIUM SUCC 40 MG IJ SOLR
10.0000 mg | Freq: Two times a day (BID) | INTRAMUSCULAR | Status: DC
  Administered 2024-01-14 – 2024-01-16 (×5): 10 mg via INTRAVENOUS
  Filled 2024-01-14 (×6): qty 1

## 2024-01-14 MED ORDER — IPRATROPIUM-ALBUTEROL 0.5-2.5 (3) MG/3ML IN SOLN
3.0000 mL | RESPIRATORY_TRACT | Status: DC
  Administered 2024-01-14: 3 mL via RESPIRATORY_TRACT
  Filled 2024-01-14: qty 3

## 2024-01-14 MED ORDER — OXYCODONE HCL 5 MG PO TABS
5.0000 mg | ORAL_TABLET | Freq: Four times a day (QID) | ORAL | Status: DC | PRN
  Administered 2024-01-15 – 2024-01-16 (×2): 5 mg via ORAL
  Filled 2024-01-14 (×2): qty 1

## 2024-01-14 MED ORDER — ACETAMINOPHEN 325 MG PO TABS
650.0000 mg | ORAL_TABLET | Freq: Four times a day (QID) | ORAL | Status: DC | PRN
Start: 2024-01-14 — End: 2024-01-16

## 2024-01-14 MED ORDER — CALCIUM CARBONATE ANTACID 500 MG PO CHEW: 1.0000 | CHEWABLE_TABLET | Freq: Three times a day (TID) | ORAL | Status: DC | PRN

## 2024-01-14 MED ORDER — GUAIFENESIN ER 600 MG PO TB12
600.0000 mg | ORAL_TABLET | Freq: Two times a day (BID) | ORAL | Status: DC
  Administered 2024-01-14 – 2024-01-16 (×5): 600 mg via ORAL
  Filled 2024-01-14 (×5): qty 1

## 2024-01-14 MED ORDER — DULOXETINE HCL 30 MG PO CPEP
30.0000 mg | ORAL_CAPSULE | Freq: Every day | ORAL | Status: DC
  Administered 2024-01-14 – 2024-01-16 (×3): 30 mg via ORAL
  Filled 2024-01-14 (×3): qty 1

## 2024-01-14 MED ORDER — MIDODRINE HCL 5 MG PO TABS: 5.0000 mg | ORAL_TABLET | ORAL | Status: DC

## 2024-01-14 MED ORDER — INSULIN ASPART 100 UNIT/ML IJ SOLN
0.0000 [IU] | Freq: Three times a day (TID) | INTRAMUSCULAR | Status: DC
  Administered 2024-01-14 – 2024-01-15 (×3): 1 [IU] via SUBCUTANEOUS
  Administered 2024-01-16: 2 [IU] via SUBCUTANEOUS
  Filled 2024-01-14 (×3): qty 1

## 2024-01-14 MED ORDER — BUDESONIDE 0.5 MG/2ML IN SUSP
0.5000 mg | Freq: Two times a day (BID) | RESPIRATORY_TRACT | Status: DC
  Administered 2024-01-14 – 2024-01-16 (×3): 0.5 mg via RESPIRATORY_TRACT
  Filled 2024-01-14 (×5): qty 2

## 2024-01-14 MED ORDER — FLUTICASONE FUROATE-VILANTEROL 200-25 MCG/ACT IN AEPB
1.0000 | INHALATION_SPRAY | Freq: Every day | RESPIRATORY_TRACT | Status: DC
  Administered 2024-01-14 – 2024-01-16 (×3): 1 via RESPIRATORY_TRACT
  Filled 2024-01-14: qty 28

## 2024-01-14 MED ORDER — OXYCODONE HCL ER 10 MG PO T12A
10.0000 mg | EXTENDED_RELEASE_TABLET | Freq: Two times a day (BID) | ORAL | Status: DC
  Administered 2024-01-14 (×2): 10 mg via ORAL
  Filled 2024-01-14 (×2): qty 1

## 2024-01-14 MED ORDER — IPRATROPIUM-ALBUTEROL 0.5-2.5 (3) MG/3ML IN SOLN: 3.0000 mL | RESPIRATORY_TRACT | Status: DC | PRN

## 2024-01-14 MED ORDER — IPRATROPIUM-ALBUTEROL 0.5-2.5 (3) MG/3ML IN SOLN
3.0000 mL | Freq: Three times a day (TID) | RESPIRATORY_TRACT | Status: DC
  Administered 2024-01-15 – 2024-01-16 (×3): 3 mL via RESPIRATORY_TRACT
  Filled 2024-01-14 (×5): qty 3

## 2024-01-14 MED ORDER — ACETAMINOPHEN 650 MG RE SUPP: 650.0000 mg | Freq: Four times a day (QID) | RECTAL | Status: DC | PRN

## 2024-01-14 MED ORDER — FAMOTIDINE 20 MG PO TABS
10.0000 mg | ORAL_TABLET | Freq: Every day | ORAL | Status: DC
  Administered 2024-01-14 – 2024-01-16 (×3): 10 mg via ORAL
  Filled 2024-01-14 (×3): qty 1

## 2024-01-14 MED ORDER — PANTOPRAZOLE SODIUM 40 MG PO TBEC
40.0000 mg | DELAYED_RELEASE_TABLET | Freq: Every day | ORAL | Status: DC
  Administered 2024-01-14 – 2024-01-16 (×3): 40 mg via ORAL
  Filled 2024-01-14 (×3): qty 1

## 2024-01-14 MED ORDER — HEPARIN SODIUM (PORCINE) 5000 UNIT/ML IJ SOLN
5000.0000 [IU] | Freq: Three times a day (TID) | INTRAMUSCULAR | Status: DC
  Administered 2024-01-14 – 2024-01-16 (×8): 5000 [IU] via SUBCUTANEOUS
  Filled 2024-01-14 (×8): qty 1

## 2024-01-14 MED ORDER — SODIUM CHLORIDE 0.9 % IV SOLN
2.0000 g | INTRAVENOUS | Status: DC
  Administered 2024-01-14: 2 g via INTRAVENOUS
  Filled 2024-01-14: qty 20

## 2024-01-14 MED ORDER — CALCITRIOL 0.25 MCG PO CAPS
0.2500 ug | ORAL_CAPSULE | Freq: Every day | ORAL | Status: DC
  Administered 2024-01-14 – 2024-01-16 (×3): 0.25 ug via ORAL
  Filled 2024-01-14 (×3): qty 1

## 2024-01-14 MED ORDER — IPRATROPIUM-ALBUTEROL 0.5-2.5 (3) MG/3ML IN SOLN: 3.0000 mL | Freq: Four times a day (QID) | RESPIRATORY_TRACT | Status: DC

## 2024-01-14 MED ORDER — IPRATROPIUM-ALBUTEROL 0.5-2.5 (3) MG/3ML IN SOLN
3.0000 mL | Freq: Once | RESPIRATORY_TRACT | Status: AC
  Administered 2024-01-14: 3 mL via RESPIRATORY_TRACT
  Filled 2024-01-14: qty 3

## 2024-01-14 MED ORDER — OXYCODONE HCL 5 MG PO TABS
5.0000 mg | ORAL_TABLET | Freq: Once | ORAL | Status: AC
  Administered 2024-01-14: 5 mg via ORAL
  Filled 2024-01-14: qty 1

## 2024-01-14 MED ORDER — SODIUM CHLORIDE 0.9 % IV SOLN
500.0000 mg | INTRAVENOUS | Status: DC
  Administered 2024-01-14 – 2024-01-15 (×2): 500 mg via INTRAVENOUS
  Filled 2024-01-14 (×2): qty 5

## 2024-01-14 MED ORDER — SACCHAROMYCES BOULARDII 250 MG PO CAPS: 250.0000 mg | ORAL_CAPSULE | Freq: Every day | ORAL | Status: DC

## 2024-01-14 NOTE — Progress Notes (Signed)
 [facility] Progress Note  Patient: Katie [name]    FMW:[id number]  DOA: 01/13/2024     Date of Service: the patient was seen and examined on 01/14/2024  Chief Complaint  Patient presents with   Fall   Loss of Consciousness   Brief hospital course: Zada Haser is a 68 y.o. Caucasian female with medical history significant for COPD, type II reasonableness, left eye blindness, end-stage renal disease on hemodialysis, essential hypertension, severe aortic stenosis and chronic diastolic CHF, who presented to the emergency room with acute onset of syncope and subsequent fall.  The patient has been having chest pain and dyspnea with associated provide cough with wheeze over the last few days.  She had nausea yesterday without vomiting or diaphoresis.  No head injuries.  She believes she had tactile fever and chills.  She admits to urinary frequency and urgency without dysuria or hematuria or flank pain.  No bleeding diathesis.  She did not miss any hemodialysis sessions.   ED Course: When the patient came to the ER, BP was 175/70 with otherwise normal vital signs.  Labs revealed potassium of 5.3 and BUN of 86 with creatinine 4.77, albumin  3.2 and ALT 58 with otherwise unremarkable CMP.  BNP was 1301.5 and high-sensitivity troponin I was 31.  Lactic acid was 0.7.  CBC showed anemia with macrocytosis and mild thrombocytopenia of 139.  TSH was 1.28.  UA was positive for UTI.  Urine culture was sent.  Blood cultures were ordered. EKG as reviewed by me : EKG showed normal sinus rhythm with rate of 98 with probable left atrial enlargement and peaked T waves anteroseptally with LVH and Q waves inferiorly. Imaging: Noncontrast head CT and C-spine CT scan revealed the following: 1. No acute intracranial abnormality. No calvarial fracture. 2. No acute fracture or listhesis of the cervical spine. 3. 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 4.  Advanced vascular calcifications within the carotid bifurcations.   Chest CTA revealed the following: 1. No pulmonary embolism. 2. Extensive airway impaction with complete collapse of the left lower lobe. 3. 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 4. Extensive multi-vessel coronary artery calcification. 5. Advanced aortic and mitral valvular calcification. Echocardiography may be helpful to assess the degree of valvular dysfunction 6. Stable fusiform dilation of the ascending aorta measuring 4.1 cm in maximal diameter. Recommend annual imaging followup by CTA or MRA. This recommendation follows 2010 ACCF/AHA/AATS/ACR/ASA/SCA/SCAI/SIR/STS/SVM Guidelines for the Diagnosis and Management of Patients with Thoracic Aortic Disease. Circulation. 2010; 121: Z733-z630. Aortic aneurysm NOS (ICD10-I71.9) 7. Cholelithiasis. 8. Aortic atherosclerosis.   The patient was given IV Rocephin  and Zithromax , 50 mcg of IV fentanyl  and 4 mg of IV Zofran  as well as DuoNeb.  She will be admitted to a progressive unit bed for further evaluation and management.   Assessment and Plan:  # Sepsis due to pneumonia  # Community-acquired pneumonia, LLL with subsequent collapse as well as acute lower UTI. # Acute hypoxic respiratory failure due to pneumonia S/p IVF, d/c'd vitals stable, no need of IVF  Continue supplemental O2 inhalation and gradually wean off - Continue IV Rocephin  and Zithromax . - Mucinex  600 mg p.o. twice daily - follow blood cultures and sputum culture Pulmonologist consulted, recommended no need of bronc, continue antibiotics and steroids. Started Solu-Medrol  10 mg IV twice daily DuoNeb every 6 hourly, Pulmicort  twice daily Breo Ellipta  inhaler   # syncope and  fall # Severe aortic valve stenosis. Follow repeat 2D echocardiogram Cardiology consulted for further recommendation   # Type 2 diabetes  mellitus with renal manifestations Started NovoLog  sliding Monitor CBG Check hemoglobin A1c    # End-stage renal disease on hemodialysis Nephrology consulted, hemodialysis as per schedule  # GERD without esophagitis - continue H2 blocker therapy and PPI therapy.   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  Started Calcitrol 0.25 mcg p.o. daily. Repeat vitamin D  level after 3 to 6 months  # Abdominal aortic aneurysm 4.1 cm repeat Patient will need annual monitoring. Follow with PCP as an outpatient.  # CAD, continue aspirin  as per cardio    Body mass index is 19.5 kg/m.  Interventions:  Diet: Renal diet, fluid striction 1.5 L per DVT Prophylaxis: Subcutaneous Heparin     Advance goals of care discussion: DNR-intervene  Family Communication: family was not present at bedside, at the time of interview.  The pt provided permission to discuss medical plan with the family. Opportunity was given to ask question and all questions were answered satisfactorily.   Disposition:  Pt is from [facility], admitted with syncope, sepsis and PNA, still on IV Abx, which precludes a safe discharge. Discharge to [facility], when stable, most likely in 1 to 2 days.  Subjective: No significant events overnight, patient was admitted last night due to syncope, sepsis fall and pneumonia. Patient was seen during hemodialysis, tolerating well, complaining of only headache.  Breathing is getting better, still has mild productive cough, no any other complaints.   Physical Exam: General: NAD, lying comfortably Appear in no distress, affect appropriate Eyes: PERRLA ENT: Oral Mucosa Clear, moist  Neck: no JVD,  Cardiovascular: S1 and S2 Present, Audible murmur, severe AS  Respiratory: Equal air entry bilaterally, mild bibasilar crackles, no wheezes.   Abdomen: Bowel Sound present, Soft and no tenderness,  Skin: no rashes Extremities: no Pedal edema, no calf tenderness Neurologic: without any new focal  findings Gait not checked due to patient safety concerns  Vitals:   01/14/24 1220 01/14/24 1230 01/14/24 1305 01/14/24 1329  BP:  (!) 177/51 (!) 182/66   Pulse: 78 77 91   Resp: 17 (!) 22 18   Temp:  98 F (36.7 C) 98.2 F (36.8 C)   TempSrc:  Oral    SpO2: 99% 98% 97% 98%  Weight:  54.8 kg    Height:        Intake/Output Summary (Last 24 hours) at 01/14/2024 1502 Last data filed at 01/14/2024 1230 Gross per 24 hour  Intake 350 ml  Output 2000 ml  Net -1650 ml   Filed Weights   01/13/24 2253 01/14/24 0900 01/14/24 1230  Weight: 59.9 kg 56.8 kg 54.8 kg    Data Reviewed: I have personally reviewed and interpreted daily labs, tele strips, imagings as discussed above. I reviewed all nursing notes, pharmacy notes, vitals, pertinent old records I have discussed plan of care as described above with RN and patient/family.  CBC: Recent Labs  Lab 01/13/24 2259 01/14/24 0452  WBC 5.9 5.0  NEUTROABS 4.0  --   HGB 10.9* 9.6*  HCT 35.1* 30.0*  MCV 102.0* 100.7*  PLT 139* 120*   Basic Metabolic Panel: Recent Labs  Lab 01/13/24 2259 01/14/24 0452  NA 136 135  K 5.3* 4.5  CL 101 102  CO2 24 24  GLUCOSE 84 152*  BUN 86* 85*  CREATININE 4.77* 4.60*  CALCIUM  9.1 8.6*    Studies:  CT HEAD WO CONTRAST ( ) Result Date: 01/14/2024 CLINICAL DATA:  Neck trauma (Age >= 65y); Head trauma, minor (Age >= 65y). Dizziness, syncope, fall EXAM: CT HEAD WITHOUT CONTRAST CT CERVICAL SPINE WITHOUT CONTRAST TECHNIQUE: Multidetector CT imaging of the head and cervical spine was performed following the standard protocol without intravenous contrast. Multiplanar CT image reconstructions of the cervical spine were also generated. RADIATION DOSE REDUCTION: This exam was performed according to the departmental dose-optimization program which includes automated exposure control, adjustment of the mA and/or kV according to patient size and/or use of iterative reconstruction technique. COMPARISON:  None  Available. FINDINGS: CT HEAD FINDINGS Brain: Normal anatomic configuration. Parenchymal volume loss is commensurate with the patient's age. Mild periventricular white matter changes are present likely reflecting the sequela of small vessel ischemia. No abnormal intra or extra-axial mass lesion or fluid collection. No abnormal mass effect or midline shift. No evidence of acute intracranial hemorrhage or infarct. Ventricular size is normal. Cerebellum unremarkable. Vascular: No asymmetric hyperdense vasculature at the skull base. Skull: Intact Sinuses/Orbits: Paranasal sinuses are clear. Orbits are unremarkable. Other: Mastoid air cells and middle ear cavities are clear. CT CERVICAL SPINE FINDINGS Alignment: 3 mm retrolisthesis C6-7. Otherwise straightening of the cervical spine. Skull base and vertebrae: Craniocervical alignment is normal. The atlantodental interval is not widened. No acute fracture of the cervical spine. Vertebral body height is preserved. Degenerative ankylosis of C6-7 vertebral bodies. Soft tissues and spinal canal: No prevertebral fluid or swelling. No visible canal hematoma. Broad-based posterior disc osteophyte complex at C5-6 results in moderate central canal stenosis with mild flattening of the thecal sac and AP diameter of the spinal canal of 7 mm. No high-grade canal stenosis. Advanced vascular calcifications are seen within the carotid bifurcations. Disc levels: Disc space narrowing and endplate remodeling is seen at C5-6 in keeping with changes of advanced degenerative disc disease. Degenerative ankylosis and obliteration of the C6-7 disc space. Milder degenerative changes noted at C4-5. Prevertebral soft tissues are not thickened on sagittal reformats. No significant neuroforaminal narrowing. Upper chest: Negative. Other: None IMPRESSION: 1. No acute intracranial abnormality. No calvarial fracture. 2. No acute fracture or listhesis of the cervical spine. 3. Degenerative disc and  degenerative joint disease resulting in moderate central canal stenosis at C5-6. 4. Advanced vascular calcifications within the carotid bifurcations. Electronically Signed   By: Dorethia Molt M.D.   On: 01/14/2024 00:49   CT Cervical Spine Wo Contrast Result Date: 01/14/2024 CLINICAL DATA:  Neck trauma (Age >= 65y); Head trauma, minor (Age >= 65y). Dizziness, syncope, fall EXAM: CT HEAD WITHOUT CONTRAST CT CERVICAL SPINE WITHOUT CONTRAST TECHNIQUE: Multidetector CT imaging of the head and cervical spine was performed following the standard protocol without intravenous contrast. Multiplanar CT image reconstructions of the cervical spine were also generated. RADIATION DOSE REDUCTION: This exam was performed according to the departmental dose-optimization program which includes automated exposure control, adjustment of the mA and/or kV according to patient size and/or use of iterative reconstruction technique. COMPARISON:  None Available. FINDINGS: CT HEAD FINDINGS Brain: Normal anatomic configuration. Parenchymal volume loss is commensurate with the patient's age. Mild periventricular white matter changes are present likely reflecting the sequela of small vessel ischemia. No abnormal intra or extra-axial mass lesion or fluid collection. No abnormal mass effect or midline shift. No evidence of acute intracranial hemorrhage or infarct. Ventricular size is normal. Cerebellum unremarkable. Vascular: No asymmetric hyperdense vasculature at the skull base. Skull: Intact Sinuses/Orbits: Paranasal sinuses are clear. Orbits are unremarkable. Other: Mastoid  air cells and middle ear cavities are clear. CT CERVICAL SPINE FINDINGS Alignment: 3 mm retrolisthesis C6-7. Otherwise straightening of the cervical spine. Skull base and vertebrae: Craniocervical alignment is normal. The atlantodental interval is not widened. No acute fracture of the cervical spine. Vertebral body height is preserved. Degenerative ankylosis of C6-7  vertebral bodies. Soft tissues and spinal canal: No prevertebral fluid or swelling. No visible canal hematoma. Broad-based posterior disc osteophyte complex at C5-6 results in moderate central canal stenosis with mild flattening of the thecal sac and AP diameter of the spinal canal of 7 mm. No high-grade canal stenosis. Advanced vascular calcifications are seen within the carotid bifurcations. Disc levels: Disc space narrowing and endplate remodeling is seen at C5-6 in keeping with changes of advanced degenerative disc disease. Degenerative ankylosis and obliteration of the C6-7 disc space. Milder degenerative changes noted at C4-5. Prevertebral soft tissues are not thickened on sagittal reformats. No significant neuroforaminal narrowing. Upper chest: Negative. Other: None IMPRESSION: 1. No acute intracranial abnormality. No calvarial fracture. 2. No acute fracture or listhesis of the cervical spine. 3. 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 4. Advanced vascular calcifications within the carotid bifurcations. Electronically Signed   By: Dorethia Molt M.D.   On: 01/14/2024 00:49   CT Angio Chest PE W and/or Wo Contrast Result Date: 01/14/2024 CLINICAL DATA:  High probability pulmonary embolism, syncope EXAM: CT ANGIOGRAPHY CHEST WITH CONTRAST TECHNIQUE: Multidetector CT imaging of the chest was performed using the standard protocol during bolus administration of intravenous contrast. Multiplanar CT image reconstructions and MIPs were obtained to evaluate the vascular anatomy. RADIATION DOSE REDUCTION: This exam was performed according to the departmental dose-optimization program which includes automated exposure control, adjustment of the mA and/or kV according to patient size and/or use of iterative reconstruction technique. CONTRAST:  75mL OMNIPAQUE  IOHEXOL  350 MG/ML SOLN COMPARISON:  11/12/2023 FINDINGS: Cardiovascular: Advanced aortic and mitral  valvular calcification. Extensive multi-vessel coronary artery calcification. Cardiac size is mildly enlarged. Adequate opacification of the pulmonary arterial tree. No intraluminal filling defect identified to suggest acute pulmonary embolism. Central pulmonary arteries are of normal caliber. No pericardial effusion. Mild atherosclerotic calcification within the thoracic aorta. Stable fusiform dilation of the ascending aorta measuring 4.1 cm in maximal diameter. Mediastinum/Nodes: No enlarged mediastinal, hilar, or axillary lymph nodes. Thyroid gland, trachea, and esophagus demonstrate no significant findings. Lungs/Pleura: Mosaic attenuation of the pulmonary parenchyma is in keeping with multifocal air trapping related to small airways disease. There is extensive airway impaction left lower lobe with complete collapse of the left lower lobe. Mild right basilar dependent atelectasis. No pneumothorax. Trace right pleural effusion Upper Abdomen: Cholelithiasis without pericholecystic inflammatory change. No acute abnormality within the visualized upper abdomen. Musculoskeletal: No chest wall abnormality. No acute or significant osseous findings. Review of the MIP images confirms the above findings. IMPRESSION: 1. No pulmonary embolism. 2. Extensive airway impaction with complete collapse of the left lower lobe. 3. 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 4. Extensive multi-vessel coronary artery calcification. 5. Advanced aortic and mitral valvular calcification. Echocardiography may be helpful to assess the degree of valvular dysfunction 6. Stable fusiform dilation of the ascending aorta measuring 4.1 cm in maximal diameter. Recommend annual imaging followup by CTA or MRA. This recommendation follows 2010 ACCF/AHA/AATS/ACR/ASA/SCA/SCAI/SIR/STS/SVM Guidelines for the Diagnosis and Management of Patients with Thoracic Aortic Disease.  Circulation. 2010; 121: Z733-z630. Aortic aneurysm NOS (ICD10-I71.9) 7.  Cholelithiasis. 8. Aortic atherosclerosis. Aortic Atherosclerosis (ICD10-I70.0). Electronically Signed   By: Dorethia Molt M.D.   On: 01/14/2024 00:39   DG Elbow Complete Left Result Date: 01/14/2024 CLINICAL DATA:  Fall, left elbow injury EXAM: LEFT ELBOW - COMPLETE 3+ VIEW COMPARISON:  None Available. FINDINGS: There is no evidence of fracture, dislocation, or joint effusion. There is no evidence of arthropathy or other focal bone abnormality. Vascular calcifications are noted. IMPRESSION: 1. Negative. Electronically Signed   By: Dorethia Molt M.D.   On: 01/14/2024 00:20   DG Wrist Complete Left Result Date: 01/14/2024 CLINICAL DATA:  Chest pain, dyspnea EXAM: LEFT WRIST - COMPLETE 3+ VIEW COMPARISON:  None Available. FINDINGS: Normal alignment. No acute fracture or dislocation. Mild polyarticular degenerative changes. Advanced vascular calcifications are noted. IMPRESSION: 1. Mild polyarticular degenerative change. Electronically Signed   By: Dorethia Molt M.D.   On: 01/14/2024 00:19   DG Hand Complete Left Result Date: 01/14/2024 CLINICAL DATA:  Fall, left hand injury EXAM: LEFT HAND - COMPLETE 3+ VIEW COMPARISON:  None Available. FINDINGS: There is no evidence of fracture or dislocation. There is no evidence of arthropathy or other focal bone abnormality. Vascular calcifications are noted. IMPRESSION: 1. Negative. Electronically Signed   By: Dorethia Molt M.D.   On: 01/14/2024 00:16    Scheduled Meds:  aspirin  EC  81 mg Oral Daily   budesonide  (PULMICORT ) nebulizer solution  0.5 mg Nebulization BID   Chlorhexidine  Gluconate Cloth  6 each Topical Q0600   DULoxetine   30 mg Oral Daily   famotidine   10 mg Oral Daily   guaiFENesin   600 mg Oral BID   heparin  injection (subcutaneous)  5,000 Units Subcutaneous Q8H   ipratropium-albuterol   3 mL Nebulization TID   melatonin  2.5 mg Oral QHS   methylPREDNISolone  (SOLU-MEDROL )  injection  10 mg Intravenous Q12H   [START ON 01/15/2024] midodrine   5 mg Oral Q M,W,F-HD   oxyCODONE   10 mg Oral Q12H   pantoprazole   40 mg Oral Daily   traZODone   75 mg Oral QHS   Continuous Infusions:  azithromycin      cefTRIAXone  (ROCEPHIN )  IV     PRN Meds: acetaminophen  **OR** acetaminophen , calcium  carbonate, chlorpheniramine-HYDROcodone , magnesium  hydroxide, ondansetron  **OR** ondansetron  (ZOFRAN ) IV, oxyCODONE , topiramate , traZODone   Time spent: 55 minutes  Author: ELVAN SOR. MD [facility] 01/14/2024 3:02 PM  To reach On-call, see care teams to locate the attending and reach out to them via [email or web address]. If 7PM-7AM, please contact night-coverage If you still have difficulty reaching the attending provider, please page the [facility] (Director on Call) for [facility] on amion for assistance.

## 2024-01-14 NOTE — H&P (Addendum)
 [place]   PATIENT NAME: Katie [name]    MR#:  [id number]  DATE OF BIRTH:  1955/08/10  DATE OF ADMISSION:  01/14/2024  PRIMARY CARE PHYSICIAN: Katie Corning, FNP   Patient is coming from: Home  REQUESTING/REFERRING PHYSICIAN: Ward, Katie SAILOR, DO  CHIEF COMPLAINT:   Chief Complaint  Patient presents with   Fall   Loss of Consciousness    HISTORY OF PRESENT ILLNESS:  Katie [name] is a 68 y.o. Caucasian female with medical history significant for COPD, type II reasonableness, left eye blindness, end-stage renal disease on hemodialysis, essential hypertension, severe aortic stenosis and chronic diastolic CHF, who presented to the emergency room with acute onset of syncope and subsequent fall.  The patient has been having chest pain and dyspnea with associated provide cough with wheeze over the last few days.  She had nausea yesterday without vomiting or diaphoresis.  No head injuries.  She believes she had tactile fever and chills.  She admits to urinary frequency and urgency without dysuria or hematuria or flank pain.  No bleeding diathesis.  She did not miss any hemodialysis sessions.  ED Course: When the patient came to the ER, BP was 175/70 with otherwise normal vital signs.  Labs revealed potassium of 5.3 and BUN of 86 with creatinine 4.77, albumin  3.2 and ALT 58 with otherwise unremarkable CMP.  BNP was 1301.5 and high-sensitivity troponin I was 31.  Lactic acid was 0.7.  CBC showed anemia with macrocytosis and mild thrombocytopenia of 139.  TSH was 1.28.  UA was positive for UTI.  Urine culture was sent.  Blood cultures were ordered. EKG as reviewed by me : EKG showed normal sinus rhythm with rate of 98 with probable left atrial enlargement and peaked T waves anteroseptally with LVH and Q waves inferiorly. Imaging: Noncontrast head CT and C-spine CT scan revealed the following: 1. No acute intracranial abnormality. No calvarial fracture. 2. No acute fracture or listhesis  of the cervical spine. 3. 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 4. Advanced vascular calcifications within the carotid bifurcations.  Chest CTA revealed the following: 1. No pulmonary embolism. 2. Extensive airway impaction with complete collapse of the left lower lobe. 3. 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 4. Extensive multi-vessel coronary artery calcification. 5. Advanced aortic and mitral valvular calcification. Echocardiography may be helpful to assess the degree of valvular dysfunction 6. Stable fusiform dilation of the ascending aorta measuring 4.1 cm in maximal diameter. Recommend annual imaging followup by CTA or MRA. This recommendation follows 2010 ACCF/AHA/AATS/ACR/ASA/SCA/SCAI/SIR/STS/SVM Guidelines for the Diagnosis and Management of Patients with Thoracic Aortic Disease. Circulation. 2010; 121: Z733-z630. Aortic aneurysm NOS (ICD10-I71.9) 7. Cholelithiasis. 8. Aortic atherosclerosis.  The patient was given IV Rocephin  and Zithromax , 50 mcg of IV fentanyl  and 4 mg of IV Zofran  as well as DuoNeb.  She will be admitted to a progressive unit bed for further evaluation and management.  PAST MEDICAL HISTORY:   Past Medical History:  Diagnosis Date   Chronic diastolic heart failure (HCC)    COPD (chronic obstructive pulmonary disease) (HCC)    Diabetes mellitus without complication (HCC)    ESRD on dialysis (HCC)    Hypertension    Severe aortic stenosis     PAST SURGICAL HISTORY:   Past Surgical History:  Procedure Laterality Date   A/V SHUNT INTERVENTION Left 12/21/2021   Procedure:  A/V SHUNT INTERVENTION;  Surgeon: Katie Selinda RAMAN, MD;  Location: [facility];  Service: Cardiovascular;  Laterality: Left;   APPENDECTOMY     AV FISTULA PLACEMENT Left 03/16/2021   Procedure: INSERTION OF ARTERIOVENOUS (AV)  GORE-TEX GRAFT ARM;  Surgeon: Katie Selinda RAMAN, MD;  Location: [facility];  Service: Vascular;  Laterality: Left;   DIALYSIS/PERMA CATHETER INSERTION N/A 05/16/2020   Procedure: DIALYSIS/PERMA CATHETER INSERTION;  Surgeon: Katie Selinda RAMAN, MD;  Location: [facility];  Service: Cardiovascular;  Laterality: N/A;   DIALYSIS/PERMA CATHETER REMOVAL N/A 03/26/2022   Procedure: DIALYSIS/PERMA CATHETER REMOVAL;  Surgeon: Katie Selinda RAMAN, MD;  Location: [facility];  Service: Cardiovascular;  Laterality: N/A;   DIALYSIS/PERMA CATHETER REPAIR N/A 01/22/2022   Procedure: DIALYSIS/PERMA CATHETER INSERT;  Surgeon: Katie Selinda RAMAN, MD;  Location: [facility];  Service: Cardiovascular;  Laterality: N/A;   MANDIBLE SURGERY     RENAL BIOPSY Right    RIGHT HEART CATH N/A 05/10/2020   Procedure: RIGHT HEART CATH;  Surgeon: Katie Bruckner, MD;  Location: [facility];  Service: Cardiovascular;  Laterality: N/A;   RIGHT/LEFT HEART CATH AND CORONARY ANGIOGRAPHY Bilateral 08/15/2023   Procedure: RIGHT/LEFT HEART CATH AND CORONARY ANGIOGRAPHY;  Surgeon: Katie Alm ORN, MD;  Location: [facility];  Service: Cardiovascular;  Laterality: Bilateral;   TEMPORARY DIALYSIS CATHETER N/A 05/11/2020   Procedure: TEMPORARY DIALYSIS CATHETER;  Surgeon: Katie Selinda RAMAN, MD;  Location: [facility];  Service: Cardiovascular;  Laterality: N/A;    SOCIAL HISTORY:   Social History   Tobacco Use   Smoking status: Former    Types: Cigarettes   Smokeless tobacco: Never   Tobacco comments:    Quit over 15 years ago  Substance Use Topics   Alcohol  use: Not Currently    FAMILY HISTORY:   Family History  Problem Relation Age of Onset   Heart attack Mother    Asthma Mother    Uterine cancer Mother    Skin cancer Father    Prostate cancer Father    CAD Father    Parkinson's disease Father    Lung cancer Brother     DRUG ALLERGIES:   Allergies  Allergen Reactions   Penicillins Nausea  And Vomiting    Pt states only pills make her vomit Other reaction(s): Nausea/Vomit    REVIEW OF SYSTEMS:   ROS As per history of present illness. All pertinent systems were reviewed above. Constitutional, HEENT, cardiovascular, respiratory, GI, GU, musculoskeletal, neuro, psychiatric, endocrine, integumentary and hematologic systems were reviewed and are otherwise negative/unremarkable except for positive findings mentioned above in the HPI.   MEDICATIONS AT HOME:   Prior to Admission medications   Medication Sig Start Date End Date Taking? Authorizing Provider  acetaminophen  (TYLENOL ) 500 MG tablet Take 500 mg by mouth 3 (three) times daily. Take additional 500 mg every 6 hours as needed for pain    [provider]  ADVAIR DISKUS 250-50 MCG/ACT AEPB Inhale 1 puff into the lungs 2 (two) times daily. 05/21/23   [provider]  albuterol  (VENTOLIN  HFA) 108 (90 Base) MCG/ACT inhaler Inhale 1 puff into the lungs every 6 (six) hours as needed for wheezing. 01/29/22   [provider]  aspirin  EC 81 MG tablet Take 1 tablet (81 mg total) by mouth daily. Swallow whole. 06/03/23   Darliss Rogue, MD  calcium  carbonate (TUMS - DOSED IN MG ELEMENTAL CALCIUM ) 500 MG chewable tablet Chew 1 tablet  by mouth 3 (three) times daily as needed (GRED).    [provider]  DULoxetine  (CYMBALTA ) 30 MG capsule Take 30 mg by mouth daily. 08/06/23   [provider]  famotidine  (PEPCID ) 20 MG tablet Take 10 mg by mouth at bedtime.    [provider]  guaifenesin  (ROBITUSSIN) 100 MG/5ML syrup Take 200 mg by mouth every 4 (four) hours as needed for cough.    [provider]  melatonin 3 MG TABS tablet Take 3 mg by mouth at bedtime.    [provider]  midodrine  (PROAMATINE ) 5 MG tablet Take 5 mg by mouth. Monday, Wednesday, Friday prior to dialysis 05/06/23   [provider]  ondansetron  (ZOFRAN ) 4 MG tablet Take 4 mg by mouth  every Monday, Wednesday, and Friday. 04/27/23   [provider]  ondansetron  (ZOFRAN -ODT) 4 MG disintegrating tablet Take 1 tablet (4 mg total) by mouth every 8 (eight) hours as needed for nausea or vomiting. 12/16/23   Suzanne Kirsch, MD  oxyCODONE  (OXY IR/ROXICODONE ) 5 MG immediate release tablet Take 5 mg by mouth every 6 (six) hours.    [provider]  pantoprazole  (PROTONIX ) 40 MG tablet Take 1 tablet (40 mg total) by mouth 2 (two) times daily. 11/22/21 01/02/24  Raenelle Coria, MD  Polyethylene Glycol 3350  POWD Take 1 Capful by mouth daily. 17 g    [provider]  pramoxine (SARNA SENSITIVE) 1 % LOTN Apply 1 Application topically 2 (two) times daily.    [provider]  Psyllium (REGULOID PO) Take 0.4 g/1.7m2 by mouth 2 (two) times daily.    [provider]  saccharomyces boulardii (FLORASTOR) 250 MG capsule Take 250 mg by mouth daily.    [provider]  senna (SENOKOT) 8.6 MG TABS tablet Take 1 tablet by mouth at bedtime.    [provider]  senna-docusate (SENOKOT-S) 8.6-50 MG tablet Take 1 tablet by mouth 2 (two) times daily as needed for mild constipation. 12/25/21   Fausto Burnard LABOR, DO  topiramate  (TOPAMAX ) 50 MG tablet Take 1 tablet (50 mg total) by mouth 2 (two) times daily as needed (headache). 03/21/21   Jhonny Calvin NOVAK, MD  traZODone  (DESYREL ) 50 MG tablet Take 1 tablet (50 mg total) by mouth every evening. 07/02/20 01/02/24  Dickie Begun, MD      VITAL SIGNS:  Blood pressure (!) 170/61, pulse (!) 101, temperature 98.4 F (36.9 C), temperature source Oral, resp. rate 18, height 5' 6 (1.676 m), weight 59.9 kg, SpO2 97%.  PHYSICAL EXAMINATION:  Physical Exam  GENERAL:  69 y.o.-year-old Caucasian female patient lying in the bed with no acute distress.  EYES: Pupils equal, round, reactive to light and accommodation with left eye blindness. No scleral icterus. Extraocular muscles intact.  HEENT: Head atraumatic,  normocephalic. Oropharynx and nasopharynx clear.  NECK:  Supple, no jugular venous distention. No thyroid enlargement, no tenderness.  LUNGS: Significantly diminished left basal and midlung zone breath sounds with mild crackles.  No use of accessory muscles of respiration.  CARDIOVASCULAR: Regular rate and rhythm, S1, S2 normal. No murmurs, rubs, or gallops.  ABDOMEN: Soft, nondistended, nontender. Bowel sounds present. No organomegaly or mass.  EXTREMITIES: No pedal edema, cyanosis, or clubbing.  NEUROLOGIC: Cranial nerves II through XII are intact. Muscle strength 5/5 in all extremities. Sensation intact. Gait not checked.  PSYCHIATRIC: The patient is alert and oriented x 3.  Normal affect and good eye contact. SKIN: No obvious rash, lesion, or ulcer.  LABORATORY PANEL:   CBC Recent Labs  Lab 01/13/24 2259  WBC 5.9  HGB 10.9*  HCT 35.1*  PLT 139*   ------------------------------------------------------------------------------------------------------------------  Chemistries  Recent Labs  Lab 01/13/24 2259  NA 136  K 5.3*  CL 101  CO2 24  GLUCOSE 84  BUN 86*  CREATININE 4.77*  CALCIUM  9.1  AST 38  ALT 58*  ALKPHOS 113  BILITOT 0.9   ------------------------------------------------------------------------------------------------------------------  Cardiac Enzymes No results for input(s): TROPONINI in the last 168 hours. ------------------------------------------------------------------------------------------------------------------  RADIOLOGY:  CT HEAD WO CONTRAST ( ) Result Date: 01/14/2024 CLINICAL DATA:  Neck trauma (Age >= 65y); Head trauma, minor (Age >= 65y). Dizziness, syncope, fall EXAM: CT HEAD WITHOUT CONTRAST CT CERVICAL SPINE WITHOUT CONTRAST TECHNIQUE: Multidetector CT imaging of the head and cervical spine was performed following the standard protocol without intravenous contrast. Multiplanar CT image reconstructions of the cervical spine were also  generated. RADIATION DOSE REDUCTION: This exam was performed according to the departmental dose-optimization program which includes automated exposure control, adjustment of the mA and/or kV according to patient size and/or use of iterative reconstruction technique. COMPARISON:  None Available. FINDINGS: CT HEAD FINDINGS Brain: Normal anatomic configuration. Parenchymal volume loss is commensurate with the patient's age. Mild periventricular white matter changes are present likely reflecting the sequela of small vessel ischemia. No abnormal intra or extra-axial mass lesion or fluid collection. No abnormal mass effect or midline shift. No evidence of acute intracranial hemorrhage or infarct. Ventricular size is normal. Cerebellum unremarkable. Vascular: No asymmetric hyperdense vasculature at the skull base. Skull: Intact Sinuses/Orbits: Paranasal sinuses are clear. Orbits are unremarkable. Other: Mastoid air cells and middle ear cavities are clear. CT CERVICAL SPINE FINDINGS Alignment: 3 mm retrolisthesis C6-7. Otherwise straightening of the cervical spine. Skull base and vertebrae: Craniocervical alignment is normal. The atlantodental interval is not widened. No acute fracture of the cervical spine. Vertebral body height is preserved. Degenerative ankylosis of C6-7 vertebral bodies. Soft tissues and spinal canal: No prevertebral fluid or swelling. No visible canal hematoma. Broad-based posterior disc osteophyte complex at C5-6 results in moderate central canal stenosis with mild flattening of the thecal sac and AP diameter of the spinal canal of 7 mm. No high-grade canal stenosis. Advanced vascular calcifications are seen within the carotid bifurcations. Disc levels: Disc space narrowing and endplate remodeling is seen at C5-6 in keeping with changes of advanced degenerative disc disease. Degenerative ankylosis and obliteration of the C6-7 disc space. Milder degenerative changes noted at C4-5. Prevertebral soft  tissues are not thickened on sagittal reformats. No significant neuroforaminal narrowing. Upper chest: Negative. Other: None IMPRESSION: 1. No acute intracranial abnormality. No calvarial fracture. 2. No acute fracture or listhesis of the cervical spine. 3. 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 4. Advanced vascular calcifications within the carotid bifurcations. Electronically Signed   By: Dorethia Molt M.D.   On: 01/14/2024 00:49   CT Cervical Spine Wo Contrast Result Date: 01/14/2024 CLINICAL DATA:  Neck trauma (Age >= 65y); Head trauma, minor (Age >= 65y). Dizziness, syncope, fall EXAM: CT HEAD WITHOUT CONTRAST CT CERVICAL SPINE WITHOUT CONTRAST TECHNIQUE: Multidetector CT imaging of the head and cervical spine was performed following the standard protocol without intravenous contrast. Multiplanar CT image reconstructions of the cervical spine were also generated. RADIATION DOSE REDUCTION: This exam was performed according to the departmental dose-optimization program which includes automated exposure control, adjustment of the mA and/or kV according to patient size and/or use of  iterative reconstruction technique. COMPARISON:  None Available. FINDINGS: CT HEAD FINDINGS Brain: Normal anatomic configuration. Parenchymal volume loss is commensurate with the patient's age. Mild periventricular white matter changes are present likely reflecting the sequela of small vessel ischemia. No abnormal intra or extra-axial mass lesion or fluid collection. No abnormal mass effect or midline shift. No evidence of acute intracranial hemorrhage or infarct. Ventricular size is normal. Cerebellum unremarkable. Vascular: No asymmetric hyperdense vasculature at the skull base. Skull: Intact Sinuses/Orbits: Paranasal sinuses are clear. Orbits are unremarkable. Other: Mastoid air cells and middle ear cavities are clear. CT CERVICAL SPINE FINDINGS Alignment: 3 mm  retrolisthesis C6-7. Otherwise straightening of the cervical spine. Skull base and vertebrae: Craniocervical alignment is normal. The atlantodental interval is not widened. No acute fracture of the cervical spine. Vertebral body height is preserved. Degenerative ankylosis of C6-7 vertebral bodies. Soft tissues and spinal canal: No prevertebral fluid or swelling. No visible canal hematoma. Broad-based posterior disc osteophyte complex at C5-6 results in moderate central canal stenosis with mild flattening of the thecal sac and AP diameter of the spinal canal of 7 mm. No high-grade canal stenosis. Advanced vascular calcifications are seen within the carotid bifurcations. Disc levels: Disc space narrowing and endplate remodeling is seen at C5-6 in keeping with changes of advanced degenerative disc disease. Degenerative ankylosis and obliteration of the C6-7 disc space. Milder degenerative changes noted at C4-5. Prevertebral soft tissues are not thickened on sagittal reformats. No significant neuroforaminal narrowing. Upper chest: Negative. Other: None IMPRESSION: 1. No acute intracranial abnormality. No calvarial fracture. 2. No acute fracture or listhesis of the cervical spine. 3. 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 4. Advanced vascular calcifications within the carotid bifurcations. Electronically Signed   By: Dorethia Molt M.D.   On: 01/14/2024 00:49   CT Angio Chest PE W and/or Wo Contrast Result Date: 01/14/2024 CLINICAL DATA:  High probability pulmonary embolism, syncope EXAM: CT ANGIOGRAPHY CHEST WITH CONTRAST TECHNIQUE: Multidetector CT imaging of the chest was performed using the standard protocol during bolus administration of intravenous contrast. Multiplanar CT image reconstructions and MIPs were obtained to evaluate the vascular anatomy. RADIATION DOSE REDUCTION: This exam was performed according to the departmental dose-optimization  program which includes automated exposure control, adjustment of the mA and/or kV according to patient size and/or use of iterative reconstruction technique. CONTRAST:  75mL OMNIPAQUE  IOHEXOL  350 MG/ML SOLN COMPARISON:  11/12/2023 FINDINGS: Cardiovascular: Advanced aortic and mitral valvular calcification. Extensive multi-vessel coronary artery calcification. Cardiac size is mildly enlarged. Adequate opacification of the pulmonary arterial tree. No intraluminal filling defect identified to suggest acute pulmonary embolism. Central pulmonary arteries are of normal caliber. No pericardial effusion. Mild atherosclerotic calcification within the thoracic aorta. Stable fusiform dilation of the ascending aorta measuring 4.1 cm in maximal diameter. Mediastinum/Nodes: No enlarged mediastinal, hilar, or axillary lymph nodes. Thyroid gland, trachea, and esophagus demonstrate no significant findings. Lungs/Pleura: Mosaic attenuation of the pulmonary parenchyma is in keeping with multifocal air trapping related to small airways disease. There is extensive airway impaction left lower lobe with complete collapse of the left lower lobe. Mild right basilar dependent atelectasis. No pneumothorax. Trace right pleural effusion Upper Abdomen: Cholelithiasis without pericholecystic inflammatory change. No acute abnormality within the visualized upper abdomen. Musculoskeletal: No chest wall abnormality. No acute or significant osseous findings. Review of the MIP images confirms the above findings. IMPRESSION: 1. No pulmonary embolism. 2. Extensive airway impaction with complete collapse of the left lower lobe.  3. 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 4. Extensive multi-vessel coronary artery calcification. 5. Advanced aortic and mitral valvular calcification. Echocardiography may be helpful to assess the degree of valvular dysfunction 6. Stable fusiform  dilation of the ascending aorta measuring 4.1 cm in maximal diameter. Recommend annual imaging followup by CTA or MRA. This recommendation follows 2010 ACCF/AHA/AATS/ACR/ASA/SCA/SCAI/SIR/STS/SVM Guidelines for the Diagnosis and Management of Patients with Thoracic Aortic Disease. Circulation. 2010; 121: Z733-z630. Aortic aneurysm NOS (ICD10-I71.9) 7. Cholelithiasis. 8. Aortic atherosclerosis. Aortic Atherosclerosis (ICD10-I70.0). Electronically Signed   By: Dorethia Molt M.D.   On: 01/14/2024 00:39   DG Elbow Complete Left Result Date: 01/14/2024 CLINICAL DATA:  Fall, left elbow injury EXAM: LEFT ELBOW - COMPLETE 3+ VIEW COMPARISON:  None Available. FINDINGS: There is no evidence of fracture, dislocation, or joint effusion. There is no evidence of arthropathy or other focal bone abnormality. Vascular calcifications are noted. IMPRESSION: 1. Negative. Electronically Signed   By: Dorethia Molt M.D.   On: 01/14/2024 00:20   DG Wrist Complete Left Result Date: 01/14/2024 CLINICAL DATA:  Chest pain, dyspnea EXAM: LEFT WRIST - COMPLETE 3+ VIEW COMPARISON:  None Available. FINDINGS: Normal alignment. No acute fracture or dislocation. Mild polyarticular degenerative changes. Advanced vascular calcifications are noted. IMPRESSION: 1. Mild polyarticular degenerative change. Electronically Signed   By: Dorethia Molt M.D.   On: 01/14/2024 00:19   DG Hand Complete Left Result Date: 01/14/2024 CLINICAL DATA:  Fall, left hand injury EXAM: LEFT HAND - COMPLETE 3+ VIEW COMPARISON:  None Available. FINDINGS: There is no evidence of fracture or dislocation. There is no evidence of arthropathy or other focal bone abnormality. Vascular calcifications are noted. IMPRESSION: 1. Negative. Electronically Signed   By: Dorethia Molt M.D.   On: 01/14/2024 00:16      IMPRESSION AND PLAN:  Assessment and Plan: * Sepsis due to pneumonia [facility]) - The patient will be admitted to a medical telemetry bed. - This is secondary to left  lower lobe community-acquired pneumonia with subsequent collapse as well as acute lower UTI. - Will continue antibiotic therapy with IV Rocephin  and Zithromax . - Mucolytic therapy be provided as well as duo nebs q.i.d. and q.4 hours p.r.n. - We will follow blood cultures. -She will be hydrated with IV lactated ringer . - Sepsis is manifested by tachycardia and tachypnea. - Will hold off long-acting beta agonist.  Type 2 diabetes mellitus with renal manifestations (HCC) - The patient will be placed on supplemental coverage with NovoLog .  End-stage renal disease on hemodialysis [facility]) - Nephrology consult will be obtained follow-up on hemodialysis.  GERD without esophagitis - Will continue H2 blocker therapy and PPI therapy.   DVT prophylaxis: Lovenox .  Advanced Care Planning:  Code Status: The patient is DNR and DNI. Family Communication:  The plan of care was discussed in details with the patient (and family). I answered all questions. The patient agreed to proceed with the above mentioned plan. Further management will depend upon hospital course. Disposition Plan: Back to previous home environment Consults called: Nephrology All the records are reviewed and case discussed with ED provider.  Status is: Inpatient  At the time of the admission, it appears that the appropriate admission status for this patient is inpatient.  This is judged to be reasonable and necessary in order to provide the required intensity of service to ensure the patient's safety given the presenting symptoms, physical exam findings and initial radiographic and laboratory data  in the context of comorbid conditions.  The patient requires inpatient status due to high intensity of service, high risk of further deterioration and high frequency of surveillance required.  I certify that at the time of admission, it is my clinical judgment that the patient will require inpatient hospital care extending more than 2  midnights.                            Dispo: The patient is from: Home              Anticipated d/c is to: Home              Patient currently is not medically stable to d/c.              Difficult to place patient: No  [place] DELENA Peaches M.D on 01/14/2024 at 1:55 AM  [facility]   From 7 PM-7 AM, contact night-coverage [email or web address]  CC: Primary care physician; Katie Corning, FNP

## 2024-01-14 NOTE — Assessment & Plan Note (Addendum)
-   Will continue H2 blocker therapy and PPI therapy.

## 2024-01-14 NOTE — Assessment & Plan Note (Signed)
-   Nephrology consult will be obtained follow-up on hemodialysis.

## 2024-01-14 NOTE — Plan of Care (Signed)
?  Problem: Clinical Measurements: ?Goal: Signs and symptoms of infection will decrease ?Outcome: Progressing ?  ?Problem: Health Behavior/Discharge Planning: ?Goal: Ability to manage health-related needs will improve ?Outcome: Progressing ?  ?

## 2024-01-14 NOTE — Consult Note (Signed)
 Cardiology Consultation   Patient ID: Katie [name] MRN: [id number]; DOB: 12/10/1955  Admit date: 01/13/2024 Date of Consult: 01/14/2024  PCP:  Cordella Corning, FNP   [facility] Providers Cardiologist:  Redell Cave, MD    Patient Profile: Katie [name] is a 68 y.o. female with a hx of CAD (LAD, RCA, left circumflex calcifications on chest CT), chronic diastolic CHF, ESRD on HD (MWF), CVA, former tobacco abuse, COPD, severe AAS who is being seen 01/14/2024 for the evaluation of syncope at the request of Dr. Von.  History of Present Illness: [name] had echocardiogram in February 2024 that showed normal systolic function, EF 60 to 65%, grade 2 diastolic dysfunction, moderate to severe AS.  Chest CT in 2023 showed three-vessel coronary calcification, aortic valve calcification.  Echo in 2025 showed EF of 60 to 65%, no wall motion abnormality, grade 1 diastolic dysfunction, severe AAS.  Cardiac cath showed severe heavily calcified aortic stenosis with a mean gradient by cath 40 to 50 mmHg, single-vessel CAD with 60% LAD stenosis, minimal PDA disease.  Aspirin  was recommended.  Patient was referred to structural valve team.  Patient saw Dr. Verlin 10/24/2023.  Plan was to proceed with TAVR.  She was referred to CT surgeon on TAVR team.  Patient saw Dr. Sherrine 01/06/2024 and felt that TAVR was the best option.  Patient was scheduled for TAVR on 01/07/2024, however did not arrive for her scheduled procedure.  They called [facility] for the patient resides, they reported no one on staff could provide transportation.   Patient presented to the ER 01/13/2024 for fall and loss of consciousness.  The patient does not remember much prior to falling.  She remembers she fell and hit her head on the floor.  She remembers being out for only a few seconds before coming to.  She reports she was on the floor about 30 minutes before someone helped her, however is unsure who helped her.  She may  have felt dizzy and lightheaded prior to falling, but was unsure.  She denied chest pain.  She reports chronic shortness of breath.  In the ER blood pressure 175/70, pulse rate 98, respiratory rate 16, temperature 99.1 F, 99% O2.  Labs showed RBC 3.44, hemoglobin 10.9, potassium 5.3, serum creatinine 4.77, albumin  3.2, ALT 58.  You GI positive.  BNP 05/17/1999.  High-sensitivity troponin 31, 23.  EKG showed normal sinus rhythm, LVH, minimal ST depression lateral leads..  CT of the chest showed no PE, collapse of the left lower lobe, stable fusiform dilation of the ascending aorta measuring 4.1 cm..  CT head nonacute.  CT spine nonacute.  Patient was given IV Rocephin , Zithromax , IV fentanyl , IV Zofran , and DuoNebs.   Past Medical History:  Diagnosis Date   Chronic diastolic heart failure (HCC)    COPD (chronic obstructive pulmonary disease) (HCC)    Diabetes mellitus without complication (HCC)    ESRD on dialysis (HCC)    Hypertension    Severe aortic stenosis     Past Surgical History:  Procedure Laterality Date   A/V SHUNT INTERVENTION Left 12/21/2021   Procedure: A/V SHUNT INTERVENTION;  Surgeon: Marea Selinda RAMAN, MD;  Location: [facility];  Service: Cardiovascular;  Laterality: Left;   APPENDECTOMY     AV FISTULA PLACEMENT Left 03/16/2021   Procedure: INSERTION OF ARTERIOVENOUS (AV) GORE-TEX GRAFT ARM;  Surgeon: Marea Selinda RAMAN, MD;  Location: [facility];  Service: Vascular;  Laterality: Left;   DIALYSIS/PERMA CATHETER INSERTION N/A 05/16/2020  Procedure: DIALYSIS/PERMA CATHETER INSERTION;  Surgeon: Marea Selinda RAMAN, MD;  Location: [facility];  Service: Cardiovascular;  Laterality: N/A;   DIALYSIS/PERMA CATHETER REMOVAL N/A 03/26/2022   Procedure: DIALYSIS/PERMA CATHETER REMOVAL;  Surgeon: Marea Selinda RAMAN, MD;  Location: [facility];  Service: Cardiovascular;  Laterality: N/A;   DIALYSIS/PERMA CATHETER REPAIR N/A 01/22/2022   Procedure: DIALYSIS/PERMA CATHETER INSERT;  Surgeon:  Marea Selinda RAMAN, MD;  Location: [facility];  Service: Cardiovascular;  Laterality: N/A;   MANDIBLE SURGERY     RENAL BIOPSY Right    RIGHT HEART CATH N/A 05/10/2020   Procedure: RIGHT HEART CATH;  Surgeon: Mady Bruckner, MD;  Location: [facility];  Service: Cardiovascular;  Laterality: N/A;   RIGHT/LEFT HEART CATH AND CORONARY ANGIOGRAPHY Bilateral 08/15/2023   Procedure: RIGHT/LEFT HEART CATH AND CORONARY ANGIOGRAPHY;  Surgeon: Anner Alm ORN, MD;  Location: [facility];  Service: Cardiovascular;  Laterality: Bilateral;   TEMPORARY DIALYSIS CATHETER N/A 05/11/2020   Procedure: TEMPORARY DIALYSIS CATHETER;  Surgeon: Marea Selinda RAMAN, MD;  Location: [facility];  Service: Cardiovascular;  Laterality: N/A;     Home Medications:  Prior to Admission medications   Medication Sig Start Date End Date Taking? Authorizing Provider  acetaminophen  (TYLENOL ) 500 MG tablet Take 500 mg by mouth 3 (three) times daily. Take additional 500 mg every 6 hours as needed for pain   Yes [provider]  ADVAIR DISKUS 250-50 MCG/ACT AEPB Inhale 1 puff into the lungs 2 (two) times daily. 05/21/23  Yes [provider]  albuterol  (VENTOLIN  HFA) 108 (90 Base) MCG/ACT inhaler Inhale 1 puff into the lungs every 6 (six) hours as needed for wheezing. 01/29/22  Yes [provider]  aspirin  81 MG chewable tablet Chew 81 mg by mouth daily. 01/14/24  Yes [provider]  DULoxetine  (CYMBALTA ) 30 MG capsule Take 30 mg by mouth daily. 08/06/23  Yes [provider]  famotidine  (PEPCID ) 20 MG tablet Take 10 mg by mouth at bedtime.   Yes [provider]  guaifenesin  (ROBITUSSIN) 100 MG/5ML syrup Take 200 mg by mouth every 4 (four) hours as needed for cough.   Yes [provider]  loperamide  (IMODIUM ) 2 MG capsule Take 2 mg by mouth in the morning and at bedtime. 01/06/24  Yes [provider]  melatonin 3 MG TABS tablet Take 3 mg by mouth  at bedtime.   Yes [provider]  midodrine  (PROAMATINE ) 5 MG tablet Take 5 mg by mouth. Monday, Wednesday, Friday prior to dialysis 05/06/23  Yes [provider]  ondansetron  (ZOFRAN ) 4 MG tablet Take 4 mg by mouth every Monday, Wednesday, and Friday. 04/27/23  Yes [provider]  oxycodone  (OXY-IR) 5 MG capsule Take 5 mg by mouth every 6 (six) hours.   Yes [provider]  pantoprazole  (PROTONIX ) 40 MG tablet Take 40 mg by mouth daily. 01/14/24  Yes [provider]  pramoxine (SARNA SENSITIVE) 1 % LOTN Apply 1 Application topically 2 (two) times daily.   Yes [provider]  topiramate  (TOPAMAX ) 50 MG tablet Take 1 tablet (50 mg total) by mouth 2 (two) times daily as needed (headache). 03/21/21  Yes Sreenath, Sudheer B, MD  traZODone  (DESYREL ) 50 MG tablet Take 75 mg by mouth at bedtime. 01/14/24  Yes [provider]  calcium  carbonate (TUMS - DOSED IN MG ELEMENTAL CALCIUM ) 500 MG chewable tablet Chew 1 tablet by mouth 3 (three) times daily as  needed (GRED).    [provider]  ondansetron  (ZOFRAN -ODT) 4 MG disintegrating tablet Take 1 tablet (4 mg total) by mouth every 8 (eight) hours as needed for nausea or vomiting. Patient not taking: Reported on 01/14/2024 12/16/23   Suzanne Kirsch, MD  Polyethylene Glycol 3350  POWD Take 1 Capful by mouth daily. 17 g Patient not taking: Reported on 01/14/2024    [provider]  Psyllium (REGULOID PO) Take 0.4 g/1.7m2 by mouth 2 (two) times daily. Patient not taking: Reported on 01/14/2024    [provider]  saccharomyces boulardii (FLORASTOR) 250 MG capsule Take 250 mg by mouth daily. Patient not taking: Reported on 01/14/2024    [provider]  senna (SENOKOT) 8.6 MG TABS tablet Take 1 tablet by mouth at bedtime. Patient not taking: Reported on 01/14/2024    [provider]  senna-docusate (SENOKOT-S) 8.6-50 MG tablet Take 1 tablet by mouth 2 (two) times  daily as needed for mild constipation. Patient not taking: Reported on 01/14/2024 12/25/21   Fausto Sor A, DO    Scheduled Meds:  aspirin  EC  81 mg Oral Daily   budesonide  (PULMICORT ) nebulizer solution  0.5 mg Nebulization BID   calcitRIOL   0.25 mcg Oral Daily   Chlorhexidine  Gluconate Cloth  6 each Topical Q0600   DULoxetine   30 mg Oral Daily   famotidine   10 mg Oral Daily   fluticasone  furoate-vilanterol  1 puff Inhalation Daily   guaiFENesin   600 mg Oral BID   heparin  injection (subcutaneous)  5,000 Units Subcutaneous Q8H   insulin  aspart  0-9 Units Subcutaneous TID WC   ipratropium-albuterol   3 mL Nebulization TID   melatonin  2.5 mg Oral QHS   methylPREDNISolone  (SOLU-MEDROL ) injection  10 mg Intravenous Q12H   [START ON 01/15/2024] midodrine   5 mg Oral Q M,W,F-HD   pantoprazole   40 mg Oral Daily   traZODone   75 mg Oral QHS   Continuous Infusions:  azithromycin      cefTRIAXone  (ROCEPHIN )  IV     PRN Meds: acetaminophen  **OR** acetaminophen , calcium  carbonate, chlorpheniramine-HYDROcodone , magnesium  hydroxide, ondansetron  **OR** ondansetron  (ZOFRAN ) IV, oxyCODONE , topiramate , traZODone   Allergies:    Allergies  Allergen Reactions   Penicillins Nausea And Vomiting    Pt states only pills make her vomit Other reaction(s): Nausea/Vomit    Social History:   Social History   Socioeconomic History   Marital status: Single    Spouse name: Not on file   Number of children: 0   Years of education: Not on file   Highest education level: Not on file  Occupational History   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  Tobacco Use   Smoking status: Former    Types: Cigarettes   Smokeless tobacco: Never   Tobacco comments:    Quit over 15 years ago  Vaping Use   Vaping status: Never Used  Substance and Sexual Activity   Alcohol  use: Not Currently   Drug use: Never   Sexual activity: Not on file  Other Topics Concern   Not on file  Social History  Narrative   Not on file   Social Drivers of Health   Financial Resource Strain: Not on file  Food Insecurity: No Food Insecurity (01/14/2024)   Hunger Vital Sign    Worried About Running Out of Food in the Last Year: Never true    Ran Out of Food in the Last Year: Never true  Transportation Needs: No Transportation Needs (01/14/2024)   PRAPARE -  Administrator, Civil Service (Medical): No    Lack of Transportation (Non-Medical): No  Physical Activity: Not on file  Stress: Not on file  Social Connections: Patient Declined (08/15/2023)   Social Connection and Isolation Panel    Frequency of Communication with Friends and Family: Patient declined    Frequency of Social Gatherings with Friends and Family: Patient declined    Attends Religious Services: Patient declined    Database administrator or Organizations: Patient declined    Attends Banker Meetings: Patient declined    Marital Status: Patient declined  Intimate Partner Violence: Not At Risk (01/14/2024)   Humiliation, Afraid, Rape, and Kick questionnaire    Fear of Current or Ex-Partner: No    Emotionally Abused: No    Physically Abused: No    Sexually Abused: No    Family History:    Family History  Problem Relation Age of Onset   Heart attack Mother    Asthma Mother    Uterine cancer Mother    Skin cancer Father    Prostate cancer Father    CAD Father    Parkinson's disease Father    Lung cancer Brother      ROS:  Please see the history of present illness.   All other ROS reviewed and negative.     Physical Exam/Data: Vitals:   01/14/24 1305 01/14/24 1329 01/14/24 1504 01/14/24 1528  BP: (!) 182/66  (!) 171/58 (!) 173/60  Pulse: 91   97  Resp: 18   18  Temp: 98.2 F (36.8 C)  98.2 F (36.8 C) 99.2 F (37.3 C)  TempSrc:   Oral Oral  SpO2: 97% 98% 95% 96%  Weight:      Height:        Intake/Output Summary (Last 24 hours) at 01/14/2024 1646 Last data filed at 01/14/2024 1230 Gross per  24 hour  Intake 350 ml  Output 2000 ml  Net -1650 ml      01/14/2024   12:30 PM 01/14/2024    9:00 AM 01/13/2024   10:53 PM  Last 3 Weights  Weight (lbs) 120 lb 13 oz 125 lb 3.5 oz 132 lb  Weight (kg) 54.8 kg 56.8 kg 59.875 kg     Body mass index is 19.5 kg/m.  General: Frail female HEENT: normal Neck: + JVD Vascular: No carotid bruits; Distal pulses 2+ bilaterally Cardiac:  normal S1, S2; RRR; + murmur  Lungs: Diminished at bases Abd: soft, nontender, no hepatomegaly  Ext: no edema Musculoskeletal:  No deformities, BUE and BLE strength normal and equal Skin: warm and dry  Neuro: A&O x 2 Psych:  Normal affect   EKG:  The EKG was personally reviewed and demonstrates: EKG showed normal sinus rhythm, 90 bpm, LVH, minimal ST depression lateral leads. Telemetry:  Telemetry was personally reviewed and demonstrates: Sinus rhythm 90s, PVCs  Relevant CV Studies:  R/L heart cath 08/15/23    Very Small Caliber Diagonal Branches: 1st Diag lesion is 90% stenosed. 2nd Diag lesion is 80% stenosed.   Mid LAD lesion is 55% stenosed.   LV end diastolic pressure is normal.   There is severe aortic valve stenosis.   Anticipated discharge date to be determined.   Plan per discussion with primary cardiology team and nephrologist would be to have the patient be admitted under the hospitalist service.  She will need to have dialysis done while she is here and have titration of medications.   Recommend Aspirin   81mg  daily for moderate CAD.   Dominance: Codominant  POST-OPERATIVE DIAGNOSIS:   Severe heavily calcified aortic stenosis with mean gradient by cardiac catheterization of 40 to 50 mmHg (peak to peak gradient 59 mmHg) Single-vessel disease with a 60% LAD stenosis just after tandem very small caliber D1 and D2 (ostium of D1-D2 a roughly 90 and 80%); large codominant LCx with relatively no significant disease; moderate caliber codominant RCA with minimal disease in the PDA. RHC hemodynamics: RAP  1 mmHg, RV P-EDP 44/1-2 mmHg; PAP-mean 44/12-24 mmHg, PCWP mean 5 mmHg.;  Fick Cardiac Output-Index 6.41-3.87 with ao sat 93% and PA sat 70%. LV P-EDP 191/1-23 mmHg; AO P-MAP 140/61-95 mmHg Aortic AoV gradient P-P59 0.5 mmHg; mean AoV gradient estimated 45 to 50 mmHg     PT TO BE ADMITTED PER NEPHROLOGIST, DR LATEEF    Plan per discussion with primary cardiology team and nephrologist would be to have the patient be admitted under the hospitalist service.  She will need to have dialysis done while she is here and have titration of medications.   Recommend Aspirin  81mg  daily for moderate CAD.   Will need outpatient structural heart team   Echo 06/2023 1. Left ventricular ejection fraction, by estimation, is 60 to 65%. The  left ventricle has normal function. The left ventricle has no regional  wall motion abnormalities. There is moderate left ventricular hypertrophy.  Left ventricular diastolic  parameters are consistent with Grade I diastolic dysfunction (impaired  relaxation).   2. Right ventricular systolic function is normal. The right ventricular  size is normal. There is mildly elevated pulmonary artery systolic  pressure. The estimated right ventricular systolic pressure is 40.5 mmHg.   3. Left atrial size was mildly dilated.   4. The mitral valve is normal in structure. No evidence of mitral valve  regurgitation. No evidence of mitral stenosis. The mean mitral valve  gradient is 4.0 mmHg. Moderate mitral annular calcification.   5. The aortic valve is calcified. There is severe calcifcation of the  aortic valve. Aortic valve regurgitation is mild. Severe aortic valve  stenosis. Aortic valve area, by VTI measures 0.87 cm. Aortic valve mean  gradient measures 36.5 mmHg. Aortic  valve Vmax measures 4.75 m/s.   6. There is borderline dilatation of the ascending aorta, measuring 39  mm.   7. The inferior vena cava is normal in size with greater than 50%  respiratory variability,  suggesting right atrial pressure of 3 mmHg.   Comparison(s): Previous AV meas, peak PG, 29 mmHg mean PG.    Echo 06/2022  1. Left ventricular ejection fraction, by estimation, is 60 to 65%. The  left ventricle has normal function. The left ventricle has no regional  wall motion abnormalities. There is severe concentric left ventricular  hypertrophy. Left ventricular diastolic   parameters are consistent with Grade III diastolic dysfunction  (restrictive).   2. Right ventricular systolic function is normal. The right ventricular  size is normal.   3. Left atrial size was mildly dilated.   4. Right atrial size was mildly dilated.   5. The mitral valve is degenerative. Mild mitral valve regurgitation. No  evidence of mitral stenosis. Moderate to severe mitral annular  calcification.   6. The aortic valve is calcified. Aortic valve regurgitation is mild.  Moderate to severe aortic valve stenosis.   7. The inferior vena cava is normal in size with greater than 50%  respiratory variability, suggesting right atrial pressure of 3 mmHg.   Conclusion(s)/Recommendation(s):  Valvular findings as outlined below    RHC 04/2020 Conclusions: Mildly elevated left heart, right heart, and pulmonary artery pressures. Normal to supranormal Fick cardiac output/index. Recommendations: Continue gentle diuresis. Consider further workup of potential causes of high-output heart failure.   Lonni Hanson, MD [facility]    Laboratory Data: High Sensitivity Troponin:   Recent Labs  Lab 01/13/24 2259 01/14/24 0200  TROPONINIHS 31* 23*     Chemistry Recent Labs  Lab 01/13/24 2259 01/14/24 0452  NA 136 135  K 5.3* 4.5  CL 101 102  CO2 24 24  GLUCOSE 84 152*  BUN 86* 85*  CREATININE 4.77* 4.60*  CALCIUM  9.1 8.6*  GFRNONAA 10* 10*  ANIONGAP 11 9    Recent Labs  Lab 01/13/24 2259  PROT 6.8  ALBUMIN  3.2*  AST 38  ALT 58*  ALKPHOS 113  BILITOT 0.9   Lipids No results for  input(s): CHOL, TRIG, HDL, LABVLDL, LDLCALC, CHOLHDL in the last 168 hours.  Hematology Recent Labs  Lab 01/13/24 2259 01/14/24 0452  WBC 5.9 5.0  RBC 3.44* 2.98*  HGB 10.9* 9.6*  HCT 35.1* 30.0*  MCV 102.0* 100.7*  MCH 31.7 32.2  MCHC 31.1 32.0  RDW 12.4 12.4  PLT 139* 120*   Thyroid  Recent Labs  Lab 01/13/24 2259  TSH 1.285    BNP Recent Labs  Lab 01/13/24 2259  BNP 1,301.5*    DDimer No results for input(s): DDIMER in the last 168 hours.  Radiology/Studies:  CT HEAD WO CONTRAST ( ) Result Date: 01/14/2024 CLINICAL DATA:  Neck trauma (Age >= 65y); Head trauma, minor (Age >= 65y). Dizziness, syncope, fall EXAM: CT HEAD WITHOUT CONTRAST CT CERVICAL SPINE WITHOUT CONTRAST TECHNIQUE: Multidetector CT imaging of the head and cervical spine was performed following the standard protocol without intravenous contrast. Multiplanar CT image reconstructions of the cervical spine were also generated. RADIATION DOSE REDUCTION: This exam was performed according to the departmental dose-optimization program which includes automated exposure control, adjustment of the mA and/or kV according to patient size and/or use of iterative reconstruction technique. COMPARISON:  None Available. FINDINGS: CT HEAD FINDINGS Brain: Normal anatomic configuration. Parenchymal volume loss is commensurate with the patient's age. Mild periventricular white matter changes are present likely reflecting the sequela of small vessel ischemia. No abnormal intra or extra-axial mass lesion or fluid collection. No abnormal mass effect or midline shift. No evidence of acute intracranial hemorrhage or infarct. Ventricular size is normal. Cerebellum unremarkable. Vascular: No asymmetric hyperdense vasculature at the skull base. Skull: Intact Sinuses/Orbits: Paranasal sinuses are clear. Orbits are unremarkable. Other: Mastoid air cells and middle ear cavities are clear. CT CERVICAL SPINE FINDINGS Alignment: 3 mm  retrolisthesis C6-7. Otherwise straightening of the cervical spine. Skull base and vertebrae: Craniocervical alignment is normal. The atlantodental interval is not widened. No acute fracture of the cervical spine. Vertebral body height is preserved. Degenerative ankylosis of C6-7 vertebral bodies. Soft tissues and spinal canal: No prevertebral fluid or swelling. No visible canal hematoma. Broad-based posterior disc osteophyte complex at C5-6 results in moderate central canal stenosis with mild flattening of the thecal sac and AP diameter of the spinal canal of 7 mm. No high-grade canal stenosis. Advanced vascular calcifications are seen within the carotid bifurcations. Disc levels: Disc space narrowing and endplate remodeling is seen at C5-6 in keeping with changes of advanced degenerative disc disease. Degenerative ankylosis and obliteration of the C6-7 disc space. Milder degenerative changes noted at C4-5. Prevertebral soft tissues are not thickened  on sagittal reformats. No significant neuroforaminal narrowing. Upper chest: Negative. Other: None IMPRESSION: 1. No acute intracranial abnormality. No calvarial fracture. 2. No acute fracture or listhesis of the cervical spine. 3. 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 4. Advanced vascular calcifications within the carotid bifurcations. Electronically Signed   By: Dorethia Molt M.D.   On: 01/14/2024 00:49   CT Cervical Spine Wo Contrast Result Date: 01/14/2024 CLINICAL DATA:  Neck trauma (Age >= 65y); Head trauma, minor (Age >= 65y). Dizziness, syncope, fall EXAM: CT HEAD WITHOUT CONTRAST CT CERVICAL SPINE WITHOUT CONTRAST TECHNIQUE: Multidetector CT imaging of the head and cervical spine was performed following the standard protocol without intravenous contrast. Multiplanar CT image reconstructions of the cervical spine were also generated. RADIATION DOSE REDUCTION: This exam was performed according  to the departmental dose-optimization program which includes automated exposure control, adjustment of the mA and/or kV according to patient size and/or use of iterative reconstruction technique. COMPARISON:  None Available. FINDINGS: CT HEAD FINDINGS Brain: Normal anatomic configuration. Parenchymal volume loss is commensurate with the patient's age. Mild periventricular white matter changes are present likely reflecting the sequela of small vessel ischemia. No abnormal intra or extra-axial mass lesion or fluid collection. No abnormal mass effect or midline shift. No evidence of acute intracranial hemorrhage or infarct. Ventricular size is normal. Cerebellum unremarkable. Vascular: No asymmetric hyperdense vasculature at the skull base. Skull: Intact Sinuses/Orbits: Paranasal sinuses are clear. Orbits are unremarkable. Other: Mastoid air cells and middle ear cavities are clear. CT CERVICAL SPINE FINDINGS Alignment: 3 mm retrolisthesis C6-7. Otherwise straightening of the cervical spine. Skull base and vertebrae: Craniocervical alignment is normal. The atlantodental interval is not widened. No acute fracture of the cervical spine. Vertebral body height is preserved. Degenerative ankylosis of C6-7 vertebral bodies. Soft tissues and spinal canal: No prevertebral fluid or swelling. No visible canal hematoma. Broad-based posterior disc osteophyte complex at C5-6 results in moderate central canal stenosis with mild flattening of the thecal sac and AP diameter of the spinal canal of 7 mm. No high-grade canal stenosis. Advanced vascular calcifications are seen within the carotid bifurcations. Disc levels: Disc space narrowing and endplate remodeling is seen at C5-6 in keeping with changes of advanced degenerative disc disease. Degenerative ankylosis and obliteration of the C6-7 disc space. Milder degenerative changes noted at C4-5. Prevertebral soft tissues are not thickened on sagittal reformats. No significant  neuroforaminal narrowing. Upper chest: Negative. Other: None IMPRESSION: 1. No acute intracranial abnormality. No calvarial fracture. 2. No acute fracture or listhesis of the cervical spine. 3. 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 4. Advanced vascular calcifications within the carotid bifurcations. Electronically Signed   By: Dorethia Molt M.D.   On: 01/14/2024 00:49   CT Angio Chest PE W and/or Wo Contrast Result Date: 01/14/2024 CLINICAL DATA:  High probability pulmonary embolism, syncope EXAM: CT ANGIOGRAPHY CHEST WITH CONTRAST TECHNIQUE: Multidetector CT imaging of the chest was performed using the standard protocol during bolus administration of intravenous contrast. Multiplanar CT image reconstructions and MIPs were obtained to evaluate the vascular anatomy. RADIATION DOSE REDUCTION: This exam was performed according to the departmental dose-optimization program which includes automated exposure control, adjustment of the mA and/or kV according to patient size and/or use of iterative reconstruction technique. CONTRAST:  75mL OMNIPAQUE  IOHEXOL  350 MG/ML SOLN COMPARISON:  11/12/2023 FINDINGS: Cardiovascular: Advanced aortic and mitral valvular calcification. Extensive multi-vessel coronary artery calcification. Cardiac size  is mildly enlarged. Adequate opacification of the pulmonary arterial tree. No intraluminal filling defect identified to suggest acute pulmonary embolism. Central pulmonary arteries are of normal caliber. No pericardial effusion. Mild atherosclerotic calcification within the thoracic aorta. Stable fusiform dilation of the ascending aorta measuring 4.1 cm in maximal diameter. Mediastinum/Nodes: No enlarged mediastinal, hilar, or axillary lymph nodes. Thyroid gland, trachea, and esophagus demonstrate no significant findings. 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 There is extensive airway impaction left lower lobe with complete collapse of the left lower lobe. Mild right basilar dependent atelectasis. No pneumothorax. Trace right pleural effusion Upper Abdomen: Cholelithiasis without pericholecystic inflammatory change. No acute abnormality within the visualized upper abdomen. Musculoskeletal: No chest wall abnormality. No acute or significant osseous findings. Review of the MIP images confirms the above findings. IMPRESSION: 1. No pulmonary embolism. 2. Extensive airway impaction with complete collapse of the left lower lobe. 3. 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 4. Extensive multi-vessel coronary artery calcification. 5. Advanced aortic and mitral valvular calcification. Echocardiography may be helpful to assess the degree of valvular dysfunction 6. Stable fusiform dilation of the ascending aorta measuring 4.1 cm in maximal diameter. Recommend annual imaging followup by CTA or MRA. This recommendation follows 2010 ACCF/AHA/AATS/ACR/ASA/SCA/SCAI/SIR/STS/SVM Guidelines for the Diagnosis and Management of Patients with Thoracic Aortic Disease. Circulation. 2010; 121: Z733-z630. Aortic aneurysm NOS (ICD10-I71.9) 7. Cholelithiasis. 8. Aortic atherosclerosis. Aortic Atherosclerosis (ICD10-I70.0). Electronically Signed   By: Dorethia Molt M.D.   On: 01/14/2024 00:39   DG Elbow Complete Left Result Date: 01/14/2024 CLINICAL DATA:  Fall, left elbow injury EXAM: LEFT ELBOW - COMPLETE 3+ VIEW COMPARISON:  None Available. FINDINGS: There is no evidence of fracture, dislocation, or joint effusion. There is no evidence of arthropathy or other focal bone abnormality. Vascular calcifications are noted. IMPRESSION: 1. Negative. Electronically Signed   By: Dorethia Molt M.D.   On: 01/14/2024 00:20   DG Wrist Complete Left Result Date: 01/14/2024 CLINICAL DATA:  Chest  pain, dyspnea EXAM: LEFT WRIST - COMPLETE 3+ VIEW COMPARISON:  None Available. FINDINGS: Normal alignment. No acute fracture or dislocation. Mild polyarticular degenerative changes. Advanced vascular calcifications are noted. IMPRESSION: 1. Mild polyarticular degenerative change. Electronically Signed   By: Dorethia Molt M.D.   On: 01/14/2024 00:19   DG Hand Complete Left Result Date: 01/14/2024 CLINICAL DATA:  Fall, left hand injury EXAM: LEFT HAND - COMPLETE 3+ VIEW COMPARISON:  None Available. FINDINGS: There is no evidence of fracture or dislocation. There is no evidence of arthropathy or other focal bone abnormality. Vascular calcifications are noted. IMPRESSION: 1. Negative. Electronically Signed   By: Dorethia Molt M.D.   On: 01/14/2024 00:16     Assessment and Plan:  Syncope Severe AS - Patient presented with syncope found to have acute hypoxic respiratory failure, sepsis due to pneumonia, left lower lung collapse, acute lower UTI  - Patient has history of severe aortic stenosis and outpatient plan was for TAVR, but she missed TAVR procedure date. She does not have memory of this at this time. She is A&O x2 - Patient does not remember symptoms prior to her fall.  She was out for only a few seconds before coming to.  She was on the ground for 30 minutes before being found - Check orthostatic vitals. - Bps have actually been high - Repeat limited echo ordered - suspect  syncope with the above contributing factors in the setting of severe AS - would continue with plan for OP TAVR  Sepsis secondary to pneumonia Left lower lung collapse CAP Acute lower UTI Acute hypoxic respiratory failure due to pneumonia - Status post IV fluids - IV antibiotics per IM - Blood cultures ordered - Pulmonology following - IV Solu-Medrol  and DuoNeb  CAD - No chest pain reported - HS trop minimally elevated 31>23, not consistent with ACS - Continue aspirin   ESRD on HD - Nephrology following  For  questions or updates, please contact [facility] Please consult [email or web address] for contact info under    Signed, Symphany Fleissner VEAR Fishman, PA-C  01/14/2024 4:46 PM

## 2024-01-14 NOTE — Assessment & Plan Note (Addendum)
-   The patient will be admitted to a medical telemetry bed. - This is secondary to left lower lobe community-acquired pneumonia with subsequent collapse as well as acute lower UTI. - Will continue antibiotic therapy with IV Rocephin  and Zithromax . - Mucolytic therapy be provided as well as duo nebs q.i.d. and q.4 hours p.r.n. - We will follow blood cultures. -She will be hydrated with IV lactated ringer . - Sepsis is manifested by tachycardia and tachypnea. - Will hold off long-acting beta agonist.

## 2024-01-14 NOTE — Progress Notes (Signed)
 Pt receives outpt HD at [facility] on MWF. Navigator following to assist with any HD needs.  Suzen Satchel Dialysis Navigator [phone]

## 2024-01-14 NOTE — Progress Notes (Signed)
 Post dialysis  01/14/24 1230  Vitals  Temp 98 F (36.7 C)  Temp Source Oral  BP (!) 177/51  MAP (mmHg) 85  BP Location Right Arm  BP Method Automatic  Patient Position (if appropriate) Lying  Pulse Rate 77  Pulse Rate Source Monitor  ECG Heart Rate 78  Resp (!) 22  Weight 54.8 kg  Type of Weight Post-Dialysis  Oxygen Therapy  SpO2 98 %  O2 Device Room Air  During Treatment Monitoring  Blood Flow Rate (mL/min) 0 mL/min  Arterial Pressure (mmHg) -2.63 mmHg  Venous Pressure (mmHg) -1.61 mmHg  TMP (mmHg) -52.72 mmHg  Ultrafiltration Rate (mL/min) 775 mL/min  Dialysate Flow Rate (mL/min) 0 ml/min  Duration of HD Treatment -hour(s) 3 hour(s)  Cumulative Fluid Removed (mL) per Treatment  2000.16  Post Treatment  Dialyzer Clearance Lightly streaked  Hemodialysis Intake (mL) 0 mL  Liters Processed 72  Fluid Removed (mL) 2000 mL  Tolerated HD Treatment Yes  Post-Hemodialysis Comments Tolerated treatment well, no adverse effects noted. Left AVF functioned well, no prolonged bleeding post treatment.  AVG/AVF Arterial Site Held (minutes) 10 minutes  AVG/AVF Venous Site Held (minutes) 10 minutes  Fistula / Graft Left Upper arm  No placement date or time found.   Placed prior to admission: Yes  Orientation: Left  Access Location: Upper arm  Site Condition No complications  Fistula / Graft Assessment Present;Thrill;Bruit  Status Deaccessed  Drainage Description None

## 2024-01-14 NOTE — Consult Note (Signed)
 [facility] [place] Pulmonary Medicine Consultation      Date: 01/14/2024,   MRN# [id number] Katie [name] 06/17/1955    CHIEF COMPLAINT:   Abnormal CT chest   HISTORY OF PRESENT ILLNESS   68 y.o. Caucasian female with medical history significant for COPD, type II reasonableness, left eye blindness, end-stage renal disease on hemodialysis, essential hypertension, severe aortic stenosis and chronic diastolic CHF, who presented to the emergency room with acute onset of syncope and subsequent fall.    The patient has been having chest pain and dyspnea with associated provide cough with wheeze over the last few days.  She had nausea yesterday without vomiting or diaphoresis.  No head injuries.  She believes she had tactile fever and chills.  She admits to urinary frequency and urgency without dysuria or hematuria or flank pain.  No bleeding diathesis.  She did not miss any hemodialysis sessions.    ED Course: When the patient came to the ER, BP was 175/70 with otherwise normal vital signs.  Labs revealed potassium of 5.3 and BUN of 86 with creatinine 4.77, albumin  3.2 and ALT 58 with otherwise unremarkable CMP.  BNP was 1301.5 and high-sensitivity troponin I was 31.  Lactic acid was 0.7.  CBC showed anemia with macrocytosis and mild thrombocytopenia of 139.  TSH was 1.28.  UA was positive for UTI.  Urine culture was sent.  Blood cultures were ordered. EKG as reviewed by me : EKG showed normal sinus rhythm with rate of 98 with probable left atrial enlargement and peaked T waves anteroseptally with LVH and Q waves inferiorly.  CT CHEST C/W LLL pneumonia-started on ABX   PCCM consulted Patient seen and evaluated, started IV steroids and NEBS for COPD exacerbation PAST MEDICAL HISTORY   Past Medical History:  Diagnosis Date   Chronic diastolic heart failure (HCC)    COPD (chronic obstructive pulmonary disease) (HCC)    Diabetes mellitus without complication (HCC)    ESRD on dialysis (HCC)     Hypertension    Severe aortic stenosis      SURGICAL HISTORY   Past Surgical History:  Procedure Laterality Date   A/V SHUNT INTERVENTION Left 12/21/2021   Procedure: A/V SHUNT INTERVENTION;  Surgeon: Marea Selinda RAMAN, MD;  Location: [facility] INVASIVE CV LAB;  Service: Cardiovascular;  Laterality: Left;   APPENDECTOMY     AV FISTULA PLACEMENT Left 03/16/2021   Procedure: INSERTION OF ARTERIOVENOUS (AV) GORE-TEX GRAFT ARM;  Surgeon: Marea Selinda RAMAN, MD;  Location: [facility] ORS;  Service: Vascular;  Laterality: Left;   DIALYSIS/PERMA CATHETER INSERTION N/A 05/16/2020   Procedure: DIALYSIS/PERMA CATHETER INSERTION;  Surgeon: Marea Selinda RAMAN, MD;  Location: [facility];  Service: Cardiovascular;  Laterality: N/A;   DIALYSIS/PERMA CATHETER REMOVAL N/A 03/26/2022   Procedure: DIALYSIS/PERMA CATHETER REMOVAL;  Surgeon: Marea Selinda RAMAN, MD;  Location: [facility];  Service: Cardiovascular;  Laterality: N/A;   DIALYSIS/PERMA CATHETER REPAIR N/A 01/22/2022   Procedure: DIALYSIS/PERMA CATHETER INSERT;  Surgeon: Marea Selinda RAMAN, MD;  Location: [facility];  Service: Cardiovascular;  Laterality: N/A;   MANDIBLE SURGERY     RENAL BIOPSY Right    RIGHT HEART CATH N/A 05/10/2020   Procedure: RIGHT HEART CATH;  Surgeon: Mady Bruckner, MD;  Location: [facility];  Service: Cardiovascular;  Laterality: N/A;   RIGHT/LEFT HEART CATH AND CORONARY ANGIOGRAPHY Bilateral 08/15/2023   Procedure: RIGHT/LEFT HEART CATH AND CORONARY ANGIOGRAPHY;  Surgeon: Anner Alm ORN, MD;  Location: [facility];  Service: Cardiovascular;  Laterality: Bilateral;   TEMPORARY DIALYSIS CATHETER N/A 05/11/2020   Procedure: TEMPORARY DIALYSIS CATHETER;  Surgeon: Marea Selinda RAMAN, MD;  Location: [facility];  Service: Cardiovascular;  Laterality: N/A;     FAMILY HISTORY   Family History  Problem Relation Age of Onset   Heart attack Mother    Asthma Mother    Uterine cancer Mother    Skin cancer Father     Prostate cancer Father    CAD Father    Parkinson's disease Father    Lung cancer Brother      SOCIAL HISTORY   Social History   Tobacco Use   Smoking status: Former    Types: Cigarettes   Smokeless tobacco: Never   Tobacco comments:    Quit over 15 years ago  Vaping Use   Vaping status: Never Used  Substance Use Topics   Alcohol  use: Not Currently   Drug use: Never     MEDICATIONS    Home Medication:    Current Medication:  Current Facility-Administered Medications:    acetaminophen  (TYLENOL ) tablet 650 mg, 650 mg, Oral, Q6H PRN **OR** acetaminophen  (TYLENOL ) suppository 650 mg, 650 mg, Rectal, Q6H PRN, Mansy, Jan A, MD   aspirin  EC tablet 81 mg, 81 mg, Oral, Daily, Mansy, Jan A, MD, 81 mg at 01/14/24 1335   azithromycin  (ZITHROMAX ) 500 mg in sodium chloride  0.9 % 250 mL IVPB, 500 mg, Intravenous, Q24H, Mansy, Jan A, MD   budesonide  (PULMICORT ) nebulizer solution 0.5 mg, 0.5 mg, Nebulization, BID, Ellouise Mcwhirter, MD, 0.5 mg at 01/14/24 1324   calcium  carbonate (TUMS - dosed in mg elemental calcium ) chewable tablet 200 mg of elemental calcium , 1 tablet, Oral, TID PRN, Mansy, Jan A, MD   cefTRIAXone  (ROCEPHIN ) 2 g in sodium chloride  0.9 % 100 mL IVPB, 2 g, Intravenous, Q24H, Mansy, Jan A, MD   Chlorhexidine  Gluconate Cloth 2 % PADS 6 each, 6 each, Topical, Q0600, Druscilla Bald, NP, 6 each at 01/14/24 1335   chlorpheniramine-HYDROcodone  (TUSSIONEX) 10-8 MG/5ML suspension 5 mL, 5 mL, Oral, Q12H PRN, Mansy, Jan A, MD   DULoxetine  (CYMBALTA ) DR capsule 30 mg, 30 mg, Oral, Daily, Mansy, Jan A, MD, 30 mg at 01/14/24 1334   famotidine  (PEPCID ) tablet 10 mg, 10 mg, Oral, Daily, Mansy, Jan A, MD, 10 mg at 01/14/24 1334   guaiFENesin  (MUCINEX ) 12 hr tablet 600 mg, 600 mg, Oral, BID, Mansy, Jan A, MD, 600 mg at 01/14/24 0152   heparin  injection 5,000 Units, 5,000 Units, Subcutaneous, Q8H, Dail Rankin RAMAN, RPH, 5,000 Units at 01/14/24 1346   ipratropium-albuterol  (DUONEB) 0.5-2.5  (3) MG/3ML nebulizer solution 3 mL, 3 mL, Nebulization, Q4H, Breana Litts, MD, 3 mL at 01/14/24 1324   magnesium  hydroxide (MILK OF MAGNESIA) suspension 30 mL, 30 mL, Oral, Daily PRN, Mansy, Jan A, MD   melatonin tablet 2.5 mg, 2.5 mg, Oral, QHS, Mansy, Jan A, MD   methylPREDNISolone  sodium succinate (SOLU-MEDROL ) 40 mg/mL injection 10 mg, 10 mg, Intravenous, Q12H, Chidera Dearcos, MD, 10 mg at 01/14/24 1335   [START ON 01/15/2024] midodrine  (PROAMATINE ) tablet 5 mg, 5 mg, Oral, Q M,W,F-HD, Mansy, Jan A, MD   ondansetron  (ZOFRAN ) tablet 4 mg, 4 mg, Oral, Q6H PRN **OR** ondansetron  (ZOFRAN ) injection 4 mg, 4 mg, Intravenous, Q6H PRN, Mansy, Jan A, MD   oxyCODONE  (Oxy IR/ROXICODONE ) immediate release tablet 5 mg, 5 mg, Oral, Q6H PRN, Mansy, Jan A, MD   oxyCODONE  (OXYCONTIN ) 12 hr tablet 10 mg, 10 mg,  Oral, Q12H, Mansy, Jan A, MD, 10 mg at 01/14/24 1334   pantoprazole  (PROTONIX ) EC tablet 40 mg, 40 mg, Oral, Daily, Mansy, Jan A, MD, 40 mg at 01/14/24 1334   topiramate  (TOPAMAX ) tablet 50 mg, 50 mg, Oral, BID PRN, Mansy, Jan A, MD   traZODone  (DESYREL ) tablet 25 mg, 25 mg, Oral, QHS PRN, Mansy, Jan A, MD   traZODone  (DESYREL ) tablet 75 mg, 75 mg, Oral, QHS, Mansy, Jan A, MD    ALLERGIES   Penicillins   BP (!) 182/66 (BP Location: Left Arm)   Pulse 91   Temp 98.2 F (36.8 C)   Resp 18   Ht 5' 6 (1.676 m)   Wt 54.8 kg   SpO2 98%   BMI 19.50 kg/m    Review of Systems: Gen:  Denies  fever, sweats, chills weight loss  HEENT: Denies blurred vision, double vision, ear pain, eye pain, hearing loss, nose bleeds, sore throat Cardiac:  No dizziness, chest pain or heaviness, chest tightness,edema, No JVD Resp:   No cough, -sputum production, -shortness of breath,-wheezing, -hemoptysis,  Other:  All other systems negative   Physical Examination:   General Appearance: No distress  EYES PERRLA, EOM intact.   NECK Supple, No JVD Pulmonary: normal breath sounds, No wheezing.   CardiovascularNormal S1,S2.  No m/r/g.   Abdomen: Benign, Soft, non-tender. Neurology UE/LE 5/5 strength, no focal deficits Ext pulses intact, cap refill intact Left ARM Fistula ALL OTHER ROS ARE NEGATIVE      IMAGING    CT HEAD WO CONTRAST ( ) Result Date: 01/14/2024 CLINICAL DATA:  Neck trauma (Age >= 65y); Head trauma, minor (Age >= 65y). Dizziness, syncope, fall EXAM: CT HEAD WITHOUT CONTRAST CT CERVICAL SPINE WITHOUT CONTRAST TECHNIQUE: Multidetector CT imaging of the head and cervical spine was performed following the standard protocol without intravenous contrast. Multiplanar CT image reconstructions of the cervical spine were also generated. RADIATION DOSE REDUCTION: This exam was performed according to the departmental dose-optimization program which includes automated exposure control, adjustment of the mA and/or kV according to patient size and/or use of iterative reconstruction technique. COMPARISON:  None Available. FINDINGS: CT HEAD FINDINGS Brain: Normal anatomic configuration. Parenchymal volume loss is commensurate with the patient's age. Mild periventricular white matter changes are present likely reflecting the sequela of small vessel ischemia. No abnormal intra or extra-axial mass lesion or fluid collection. No abnormal mass effect or midline shift. No evidence of acute intracranial hemorrhage or infarct. Ventricular size is normal. Cerebellum unremarkable. Vascular: No asymmetric hyperdense vasculature at the skull base. Skull: Intact Sinuses/Orbits: Paranasal sinuses are clear. Orbits are unremarkable. Other: Mastoid air cells and middle ear cavities are clear. CT CERVICAL SPINE FINDINGS Alignment: 3 mm retrolisthesis C6-7. Otherwise straightening of the cervical spine. Skull base and vertebrae: Craniocervical alignment is normal. The atlantodental interval is not widened. No acute fracture of the cervical spine. Vertebral body height is preserved. Degenerative ankylosis of C6-7  vertebral bodies. Soft tissues and spinal canal: No prevertebral fluid or swelling. No visible canal hematoma. Broad-based posterior disc osteophyte complex at C5-6 results in moderate central canal stenosis with mild flattening of the thecal sac and AP diameter of the spinal canal of 7 mm. No high-grade canal stenosis. Advanced vascular calcifications are seen within the carotid bifurcations. Disc levels: Disc space narrowing and endplate remodeling is seen at C5-6 in keeping with changes of advanced degenerative disc disease. Degenerative ankylosis and obliteration of the C6-7 disc space. Milder degenerative changes noted at C4-5. Prevertebral  soft tissues are not thickened on sagittal reformats. No significant neuroforaminal narrowing. Upper chest: Negative. Other: None IMPRESSION: 1. No acute intracranial abnormality. No calvarial fracture. 2. No acute fracture or listhesis of the cervical spine. 3. 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 4. Advanced vascular calcifications within the carotid bifurcations. Electronically Signed   By: Dorethia Molt M.D.   On: 01/14/2024 00:49   CT Cervical Spine Wo Contrast Result Date: 01/14/2024 CLINICAL DATA:  Neck trauma (Age >= 65y); Head trauma, minor (Age >= 65y). Dizziness, syncope, fall EXAM: CT HEAD WITHOUT CONTRAST CT CERVICAL SPINE WITHOUT CONTRAST TECHNIQUE: Multidetector CT imaging of the head and cervical spine was performed following the standard protocol without intravenous contrast. Multiplanar CT image reconstructions of the cervical spine were also generated. RADIATION DOSE REDUCTION: This exam was performed according to the departmental dose-optimization program which includes automated exposure control, adjustment of the mA and/or kV according to patient size and/or use of iterative reconstruction technique. COMPARISON:  None Available. FINDINGS: CT HEAD FINDINGS Brain: Normal anatomic  configuration. Parenchymal volume loss is commensurate with the patient's age. Mild periventricular white matter changes are present likely reflecting the sequela of small vessel ischemia. No abnormal intra or extra-axial mass lesion or fluid collection. No abnormal mass effect or midline shift. No evidence of acute intracranial hemorrhage or infarct. Ventricular size is normal. Cerebellum unremarkable. Vascular: No asymmetric hyperdense vasculature at the skull base. Skull: Intact Sinuses/Orbits: Paranasal sinuses are clear. Orbits are unremarkable. Other: Mastoid air cells and middle ear cavities are clear. CT CERVICAL SPINE FINDINGS Alignment: 3 mm retrolisthesis C6-7. Otherwise straightening of the cervical spine. Skull base and vertebrae: Craniocervical alignment is normal. The atlantodental interval is not widened. No acute fracture of the cervical spine. Vertebral body height is preserved. Degenerative ankylosis of C6-7 vertebral bodies. Soft tissues and spinal canal: No prevertebral fluid or swelling. No visible canal hematoma. Broad-based posterior disc osteophyte complex at C5-6 results in moderate central canal stenosis with mild flattening of the thecal sac and AP diameter of the spinal canal of 7 mm. No high-grade canal stenosis. Advanced vascular calcifications are seen within the carotid bifurcations. Disc levels: Disc space narrowing and endplate remodeling is seen at C5-6 in keeping with changes of advanced degenerative disc disease. Degenerative ankylosis and obliteration of the C6-7 disc space. Milder degenerative changes noted at C4-5. Prevertebral soft tissues are not thickened on sagittal reformats. No significant neuroforaminal narrowing. Upper chest: Negative. Other: None IMPRESSION: 1. No acute intracranial abnormality. No calvarial fracture. 2. No acute fracture or listhesis of the cervical spine. 3. Degenerative disc and degenerative joint disease resulting in moderate central canal  stenosis at C5-6. 4. Advanced vascular calcifications within the carotid bifurcations. Electronically Signed   By: Dorethia Molt M.D.   On: 01/14/2024 00:49   CT Angio Chest PE W and/or Wo Contrast Result Date: 01/14/2024 CLINICAL DATA:  High probability pulmonary embolism, syncope EXAM: CT ANGIOGRAPHY CHEST WITH CONTRAST TECHNIQUE: Multidetector CT imaging of the chest was performed using the standard protocol during bolus administration of intravenous contrast. Multiplanar CT image reconstructions and MIPs were obtained to evaluate the vascular anatomy. RADIATION DOSE REDUCTION: This exam was performed according to the departmental dose-optimization program which includes automated exposure control, adjustment of the mA and/or kV according to patient size and/or use of iterative reconstruction technique. CONTRAST:  75mL OMNIPAQUE  IOHEXOL  350 MG/ML SOLN COMPARISON:  11/12/2023 FINDINGS: Cardiovascular: Advanced aortic and mitral valvular calcification. Extensive multi-vessel  coronary artery calcification. Cardiac size is mildly enlarged. Adequate opacification of the pulmonary arterial tree. No intraluminal filling defect identified to suggest acute pulmonary embolism. Central pulmonary arteries are of normal caliber. No pericardial effusion. Mild atherosclerotic calcification within the thoracic aorta. Stable fusiform dilation of the ascending aorta measuring 4.1 cm in maximal diameter. Mediastinum/Nodes: No enlarged mediastinal, hilar, or axillary lymph nodes. Thyroid gland, trachea, and esophagus demonstrate no significant findings. Lungs/Pleura: Mosaic attenuation of the pulmonary parenchyma is in keeping with multifocal air trapping related to small airways disease. There is extensive airway impaction left lower lobe with complete collapse of the left lower lobe. Mild right basilar dependent atelectasis. No pneumothorax. Trace right pleural effusion Upper Abdomen: Cholelithiasis without pericholecystic  inflammatory change. No acute abnormality within the visualized upper abdomen. Musculoskeletal: No chest wall abnormality. No acute or significant osseous findings. Review of the MIP images confirms the above findings. IMPRESSION: 1. No pulmonary embolism. 2. Extensive airway impaction with complete collapse of the left lower lobe. 3. 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 4. Extensive multi-vessel coronary artery calcification. 5. Advanced aortic and mitral valvular calcification. Echocardiography may be helpful to assess the degree of valvular dysfunction 6. Stable fusiform dilation of the ascending aorta measuring 4.1 cm in maximal diameter. Recommend annual imaging followup by CTA or MRA. This recommendation follows 2010 ACCF/AHA/AATS/ACR/ASA/SCA/SCAI/SIR/STS/SVM Guidelines for the Diagnosis and Management of Patients with Thoracic Aortic Disease. Circulation. 2010; 121: Z733-z630. Aortic aneurysm NOS (ICD10-I71.9) 7. Cholelithiasis. 8. Aortic atherosclerosis. Aortic Atherosclerosis (ICD10-I70.0). Electronically Signed   By: Dorethia Molt M.D.   On: 01/14/2024 00:39   DG Elbow Complete Left Result Date: 01/14/2024 CLINICAL DATA:  Fall, left elbow injury EXAM: LEFT ELBOW - COMPLETE 3+ VIEW COMPARISON:  None Available. FINDINGS: There is no evidence of fracture, dislocation, or joint effusion. There is no evidence of arthropathy or other focal bone abnormality. Vascular calcifications are noted. IMPRESSION: 1. Negative. Electronically Signed   By: Dorethia Molt M.D.   On: 01/14/2024 00:20   DG Wrist Complete Left Result Date: 01/14/2024 CLINICAL DATA:  Chest pain, dyspnea EXAM: LEFT WRIST - COMPLETE 3+ VIEW COMPARISON:  None Available. FINDINGS: Normal alignment. No acute fracture or dislocation. Mild polyarticular degenerative changes. Advanced vascular calcifications are noted. IMPRESSION: 1. Mild polyarticular degenerative  change. Electronically Signed   By: Dorethia Molt M.D.   On: 01/14/2024 00:19   DG Hand Complete Left Result Date: 01/14/2024 CLINICAL DATA:  Fall, left hand injury EXAM: LEFT HAND - COMPLETE 3+ VIEW COMPARISON:  None Available. FINDINGS: There is no evidence of fracture or dislocation. There is no evidence of arthropathy or other focal bone abnormality. Vascular calcifications are noted. IMPRESSION: 1. Negative. Electronically Signed   By: Dorethia Molt M.D.   On: 01/14/2024 00:16   CT Head Wo Contrast Result Date: 01/06/2024 EXAM: CT HEAD AND CERVICAL SPINE 01/06/2024 05:07:11 PM TECHNIQUE: CT of the head and cervical spine was performed without the administration of intravenous contrast. Multiplanar reformatted images are provided for review. Automated exposure control, iterative reconstruction, and/or weight based adjustment of the mA/kV was utilized to reduce the radiation dose to as low as reasonably achievable. COMPARISON: CT head and cervical spine 06/12/2022. MRI head 06/25/2020. CLINICAL HISTORY: Fall. [employer]. FINDINGS: CT HEAD BRAIN AND VENTRICLES: Mild cerebral atrophy. Periventricular white matter hypodensities, similar to the prior CT and nonspecific but compatible with minimal chronic small vessel ischemic disease. No acute intracranial hemorrhage.  No mass effect or midline shift. No abnormal extra-axial fluid collection. Gray-white differentiation is maintained. No hydrocephalus. 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 SINUSES AND MASTOIDS: No acute abnormality. SOFT TISSUES AND SKULL: Calcified atherosclerosis of the skull base. No acute skull fracture. No acute soft tissue abnormality. CT CERVICAL SPINE BONES AND ALIGNMENT: Chronic straightening of the normal cervical lordosis. Grade 1 retrolisthesis  of C6 on C7. No acute fracture or suspicious lesion. DEGENERATIVE CHANGES: Moderate disc space narrowing at C5-6 and severe narrowing at C6-7 with partial fusion between the vertebral bodies at the latter. Broad-based posterior disc osteophyte complex at C5-6 results in mild spinal stenosis. Small central disc protrusion at C4-5 resulting in at most mild spinal stenosis. SOFT TISSUES: No prevertebral soft tissue swelling. Prominent atherosclerotic calcification of the carotid bifurcations. Multiple thyroid nodules measuring up to 1 cm with no follow up imaging required. IMPRESSION: 1. No acute intracranial abnormality or cervical spine fracture. 2. Cervical disc degeneration as above. Electronically signed by: Dasie Hamburg MD 01/06/2024 05:41 PM EDT RP Workstation: [id number]   CT Cervical Spine Wo Contrast Result Date: 01/06/2024 EXAM: CT HEAD AND CERVICAL SPINE 01/06/2024 05:07:11 PM TECHNIQUE: CT of the head and cervical spine was performed without the administration of intravenous contrast. Multiplanar reformatted images are provided for review. Automated exposure control, iterative reconstruction, and/or weight based adjustment of the mA/kV was utilized to reduce the radiation dose to as low as reasonably achievable. COMPARISON: CT head and cervical spine 06/12/2022. MRI head 06/25/2020. CLINICAL HISTORY: Fall. [employer]. FINDINGS: CT HEAD BRAIN AND VENTRICLES: Mild cerebral atrophy. Periventricular white matter hypodensities, similar to the prior CT and nonspecific but compatible with minimal chronic small vessel ischemic disease. No acute intracranial hemorrhage. No mass effect or midline shift. No abnormal extra-axial fluid collection. Gray-white differentiation is maintained. No hydrocephalus. 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 SINUSES AND MASTOIDS: No acute abnormality. SOFT TISSUES AND SKULL: Calcified atherosclerosis of the skull base. No acute skull fracture. No acute soft tissue abnormality. CT CERVICAL SPINE BONES AND ALIGNMENT: Chronic straightening of the normal cervical lordosis. Grade 1 retrolisthesis of C6 on C7. No acute fracture or suspicious lesion. DEGENERATIVE CHANGES: Moderate disc space narrowing at C5-6 and severe narrowing at C6-7 with partial fusion between the vertebral bodies at the latter. Broad-based posterior disc osteophyte complex at C5-6 results in mild spinal stenosis. Small central disc protrusion at C4-5 resulting in at most mild spinal stenosis. SOFT TISSUES: No prevertebral soft tissue swelling. Prominent atherosclerotic calcification of the carotid bifurcations. Multiple thyroid nodules measuring up to 1 cm with no follow up imaging required. IMPRESSION: 1. No acute intracranial abnormality or cervical spine fracture. 2. Cervical disc degeneration as above. Electronically signed by: Dasie Hamburg MD 01/06/2024 05:41 PM EDT RP Workstation: [id number]   VAS US  DUPLEX DIALYSIS ACCESS (AVF, AVG) Result Date: 01/03/2024 DIALYSIS ACCESS Patient Name:  [facility]  Date of Exam:   01/02/2024 Medical Rec #: [id number]     Accession #:    [id number] Date of Birth: 02-06-1956    Patient Gender: F Patient Age:   50 years Exam [facility] Procedure:  VAS US  DUPLEX DIALYSIS ACCESS (AVF, AVG) Referring Phys: SELINDA GU --------------------------------------------------------------------------------  Reason for Exam: Excess bleeding post dialysis. Access Site: Left Upper Extremity. Access Type: Brach Ax. History: Left Btach Ax AVG created 03/2021. PTA of AVG 12/2021. Comparison Study: 10/2021 outside [facility] evak study for patency only Performing Technologist: Jerel Croak RVT  Examination Guidelines: A complete evaluation includes B-mode imaging, spectral Doppler, color Doppler,  and power Doppler as needed of all accessible portions of each vessel. Unilateral testing is considered an integral part of a complete examination. Limited examinations for reoccurring indications may be performed as noted.  Findings:   +--------------------+----------+-----------------+-------------------------+ AVG                 PSV (cm/s)Flow Vol (mL/min)        Describe          +--------------------+----------+-----------------+-------------------------+ Native artery inflow   179          1872                                 +--------------------+----------+-----------------+-------------------------+ Arterial anastomosis   252                                               +--------------------+----------+-----------------+-------------------------+ Prox graft             336                                               +--------------------+----------+-----------------+-------------------------+ Mid graft              301                     pseudoaneurysm and 1.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 +--------------------+----------+-----------------+-------------------------+ Distal graft           295                                               +--------------------+----------+-----------------+-------------------------+ Venous anastomosis     407                                               +--------------------+----------+-----------------+-------------------------+ Venous outflow         395                                               +--------------------+----------+-----------------+-------------------------+ +--------------+------------+----------+---------+---------+-------------------+                 Diameter  Depth (cm)Branching   PSV       Flow Volume                       (cm)                        (cm/s)       (  ml/min)       +--------------+------------+----------+---------+---------+-------------------+ Lt Rad Art Dis                                  30                         +--------------+------------+----------+---------+---------+-------------------+ antegrade                                                                 +--------------+------------+----------+---------+---------+-------------------+  Summary: Patent arteriovenous graft.  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  *See table(s) above for measurements and observations.  Diagnosing physician: Selinda Gu MD Electronically signed by Selinda Gu MD on 01/03/2024 at 8:17:31 AM.   --------------------------------------------------------------------------------   Final    CT ABDOMEN PELVIS WO CONTRAST Result Date: 12/16/2023 CLINICAL DATA:  Left lower quadrant abdominal pain. EXAM: CT ABDOMEN AND PELVIS WITHOUT CONTRAST TECHNIQUE: Multidetector CT imaging of the abdomen and pelvis was performed following the standard protocol without IV contrast. RADIATION DOSE REDUCTION: This exam was performed according to the departmental dose-optimization program which includes automated exposure control, adjustment of the mA and/or kV according to patient size and/or use of iterative reconstruction technique. COMPARISON:  Noncontrast abdominopelvic CT 06/12/2022. CTA of the chest, abdomen and pelvis 11/12/2023. FINDINGS: Lower chest: Stable chronic atelectasis at both lung bases, cardiomegaly and a chronic small pericardial effusion. Hepatobiliary: No focal hepatic abnormalities are identified on noncontrast imaging. Multiple dependent gallstones are present within the gallbladder lumen. No significant gallbladder wall thickening or biliary dilatation identified. Pancreas: Stable atrophy. No focal abnormality, ductal dilatation or surrounding inflammation identified. Spleen: Normal in size without focal abnormality. Adrenals/Urinary Tract: Stable thickening of the left adrenal gland without focal nodule. The right adrenal gland appears normal. There are renal  vascular calcifications bilaterally with mild renal cortical thinning. No evidence of hydronephrosis or perinephric soft tissue stranding. The bladder appears unremarkable for its degree of distention. Stomach/Bowel: No enteric contrast administered. The stomach appears unremarkable for its degree of distension. No evidence of bowel wall thickening, distention or surrounding inflammatory change. Distal colonic diverticular changes without evidence of acute inflammation. Vascular/Lymphatic: There are no enlarged abdominal or pelvic lymph nodes. Diffuse aortic and branch vessel atherosclerosis without evidence of aneurysm. Reproductive: The uterus and adnexa appear unremarkable. No evidence of adnexal mass. Other: Previously demonstrated ascites has resolved. No pneumoperitoneum or abdominal wall hernia identified. There is generalized subcutaneous edema, similar to previous studies. Musculoskeletal: No acute or significant osseous findings. Multilevel spondylosis with stable L2 hemangioma and intraosseous cyst within the intertrochanteric region of the right femur. IMPRESSION: 1. No acute findings or explanation for the patient's symptoms. 2. Cholelithiasis without evidence of cholecystitis or biliary dilatation. 3. Distal colonic diverticular changes without evidence of acute inflammation. 4. Stable chronic atelectasis at both lung bases, cardiomegaly and small pericardial effusion. 5.  Aortic Atherosclerosis (ICD10-I70.0). Electronically Signed   By: Elsie Perone M.D.   On: 12/16/2023 10:12      ASSESSMENT/PLAN   68 yo white female with acute COPD exacerbation with LLL pneumonia   COPD EXACERBATION -continue IV steroids as prescribed -continue  NEB THERAPY as prescribed -wean fio2 as needed  Continue IV abx  Patient is very stable and not in respiratory distress  NO indication for [facility]  Recommend out of bed to chair and flutter valve  Will sign off at this time and assess as needed  I  spent a total of 65 minutes dedicated to the care of this patient on the date of this encounter to include pre-visit review of records, face-to-face time with the patient discussing conditions above, post visit ordering of testing, clinical documentation with the electronic health record, making appropriate referrals as documented, and communicating necessary information to the patient's healthcare team.    The Patient requires high complexity decision making for assessment and support, frequent evaluation and titration of therapies, application of advanced monitoring technologies and extensive interpretation of multiple databases.  Patient satisfied with Plan of action and management. All questions answered    Nickolas Alm Cellar, M.D.  [facility]  Medical Director [facility] Medical Director [facility] Cardio-Pulmonary Department

## 2024-01-14 NOTE — Assessment & Plan Note (Signed)
-   The patient will be placed on supplemental coverage with Nov[employer]. 

## 2024-01-14 NOTE — Progress Notes (Signed)
 [facility]  ROUNDING NOTE   Subjective:   Katie [name] is a 68 year old female with past medical history of ESRD with hemodialysis, diabetes mellitus, and hypertension. Patient presents to ED with fall and syncopal. Patient will be admitted for Community acquired pneumonia [J18.9] Acute UTI [N39.0] Aortic stenosis, severe [I35.0] Injury of head, initial encounter [S09.90XA] Syncope, unspecified syncope type [R55] Chest pain, unspecified type [R07.9]  Patient is known to our practice and receives outpatient dialysis treatments at [facility] on a MWF schedule. Reviewing outpatient records, she had missed Monday.  States she became dizzy and feel in her room at facility. Does say she hit her head.  Patient seen and evaluated during dialysis.    HEMODIALYSIS FLOWSHEET:  Blood Flow Rate (mL/min): 0 mL/min Arterial Pressure (mmHg): -2.63 mmHg Venous Pressure (mmHg): -1.61 mmHg TMP (mmHg): -52.72 mmHg Ultrafiltration Rate (mL/min): 775 mL/min Dialysate Flow Rate (mL/min): 0 ml/min  Tolerating treatment well. Placed on 2L [place]. Complains of generalized soreness.   Labs on ED arrival shows potassium 5.3, BUN 86, creatinine 4.77 and GFR 10. BNP 1300 with Troponin 31. Hgb 10.9. UA cloudy with leukocytes and mild protein. CT chest shows complete collapse of LLL, multi vessel calcification, aortic and mitral valvular calcification.   We have been consulted to manage dialysis needs.    Objective:  Vital signs in last 24 hours:  Temp:  [98 F (36.7 C)-99.1 F (37.3 C)] 98.2 F (36.8 C) (09/02 1305) Pulse Rate:  [70-101] 91 (09/02 1305) Resp:  [11-22] 18 (09/02 1305) BP: (108-190)/(51-75) 182/66 (09/02 1305) SpO2:  [95 %-100 %] 98 % (09/02 1329) Weight:  [54.8 kg-59.9 kg] 54.8 kg (09/02 1230)  Weight change:  Filed Weights   01/13/24 2253 01/14/24 0900 01/14/24 1230  Weight: 59.9 kg 56.8 kg 54.8 kg    Intake/Output: I/O last 3 completed shifts: In: 350 [IV  Piggyback:350] Out: -    Intake/Output this shift:  Total I/O In: -  Out: 2000 [Other:2000]  Physical Exam: General: NAD  Head: Normocephalic, atraumatic. Moist oral mucosal membranes  Eyes: Anicteric  Neck: Supple  Lungs:  Clear to auscultation, NCO2  Heart: Regular rate and rhythm  Abdomen:  Soft, nontender  Extremities:  No peripheral edema.  Neurologic: Awake, alert, conversant  Skin: Warm,dry, no rash  Access: Lt AVF    Basic Metabolic Panel: Recent Labs  Lab 01/13/24 2259 01/14/24 0452  NA 136 135  K 5.3* 4.5  CL 101 102  CO2 24 24  GLUCOSE 84 152*  BUN 86* 85*  CREATININE 4.77* 4.60*  CALCIUM  9.1 8.6*    Liver Function Tests: Recent Labs  Lab 01/13/24 2259  AST 38  ALT 58*  ALKPHOS 113  BILITOT 0.9  PROT 6.8  ALBUMIN  3.2*   No results for input(s): LIPASE, AMYLASE in the last 168 hours. No results for input(s): AMMONIA in the last 168 hours.  CBC: Recent Labs  Lab 01/13/24 2259 01/14/24 0452  WBC 5.9 5.0  NEUTROABS 4.0  --   HGB 10.9* 9.6*  HCT 35.1* 30.0*  MCV 102.0* 100.7*  PLT 139* 120*    Cardiac Enzymes: No results for input(s): CKTOTAL, CKMB, CKMBINDEX, TROPONINI in the last 168 hours.  BNP: Invalid input(s): POCBNP  CBG: Recent Labs  Lab 01/13/24 2316  GLUCAP 83    Microbiology: Results for orders placed or performed during the hospital encounter of 01/13/24  Blood culture (single)     Status: None (Preliminary result)   Collection Time: 01/13/24 11:32  PM   Specimen: BLOOD  Result Value Ref Range Status   Specimen Description BLOOD BLOOD RIGHT ARM  Final   Special Requests   Final    BOTTLES DRAWN AEROBIC AND ANAEROBIC Blood Culture adequate volume   Culture   Final    NO GROWTH < 12 HOURS Performed at [facility], [address], [place], [place] [address]    Report Status PENDING  Incomplete    Coagulation Studies: Recent Labs    01/14/24 0452  LABPROT 14.2  INR 1.0     Urinalysis: Recent Labs    01/13/24 2307  COLORURINE AMBER*  LABSPEC 1.012  PHURINE 6.0  GLUCOSEU NEGATIVE  HGBUR NEGATIVE  BILIRUBINUR NEGATIVE  KETONESUR NEGATIVE  PROTEINUR 100*  NITRITE NEGATIVE  LEUKOCYTESUR LARGE*      Imaging: CT HEAD WO CONTRAST ( ) Result Date: 01/14/2024 CLINICAL DATA:  Neck trauma (Age >= 65y); Head trauma, minor (Age >= 65y). Dizziness, syncope, fall EXAM: CT HEAD WITHOUT CONTRAST CT CERVICAL SPINE WITHOUT CONTRAST TECHNIQUE: Multidetector CT imaging of the head and cervical spine was performed following the standard protocol without intravenous contrast. Multiplanar CT image reconstructions of the cervical spine were also generated. RADIATION DOSE REDUCTION: This exam was performed according to the departmental dose-optimization program which includes automated exposure control, adjustment of the mA and/or kV according to patient size and/or use of iterative reconstruction technique. COMPARISON:  None Available. FINDINGS: CT HEAD FINDINGS Brain: Normal anatomic configuration. Parenchymal volume loss is commensurate with the patient's age. Mild periventricular white matter changes are present likely reflecting the sequela of small vessel ischemia. No abnormal intra or extra-axial mass lesion or fluid collection. No abnormal mass effect or midline shift. No evidence of acute intracranial hemorrhage or infarct. Ventricular size is normal. Cerebellum unremarkable. Vascular: No asymmetric hyperdense vasculature at the skull base. Skull: Intact Sinuses/Orbits: Paranasal sinuses are clear. Orbits are unremarkable. Other: Mastoid air cells and middle ear cavities are clear. CT CERVICAL SPINE FINDINGS Alignment: 3 mm retrolisthesis C6-7. Otherwise straightening of the cervical spine. Skull base and vertebrae: Craniocervical alignment is normal. The atlantodental interval is not widened. No acute fracture of the cervical spine. Vertebral body height is preserved.  Degenerative ankylosis of C6-7 vertebral bodies. Soft tissues and spinal canal: No prevertebral fluid or swelling. No visible canal hematoma. Broad-based posterior disc osteophyte complex at C5-6 results in moderate central canal stenosis with mild flattening of the thecal sac and AP diameter of the spinal canal of 7 mm. No high-grade canal stenosis. Advanced vascular calcifications are seen within the carotid bifurcations. Disc levels: Disc space narrowing and endplate remodeling is seen at C5-6 in keeping with changes of advanced degenerative disc disease. Degenerative ankylosis and obliteration of the C6-7 disc space. Milder degenerative changes noted at C4-5. Prevertebral soft tissues are not thickened on sagittal reformats. No significant neuroforaminal narrowing. Upper chest: Negative. Other: None IMPRESSION: 1. No acute intracranial abnormality. No calvarial fracture. 2. No acute fracture or listhesis of the cervical spine. 3. 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 4. Advanced vascular calcifications within the carotid bifurcations. Electronically Signed   By: Dorethia Molt M.D.   On: 01/14/2024 00:49   CT Cervical Spine Wo Contrast Result Date: 01/14/2024 CLINICAL DATA:  Neck trauma (Age >= 65y); Head trauma, minor (Age >= 65y). Dizziness, syncope, fall EXAM: CT HEAD WITHOUT CONTRAST CT CERVICAL SPINE WITHOUT CONTRAST TECHNIQUE: Multidetector CT imaging of the head and cervical spine was performed following  the standard protocol without intravenous contrast. Multiplanar CT image reconstructions of the cervical spine were also generated. RADIATION DOSE REDUCTION: This exam was performed according to the departmental dose-optimization program which includes automated exposure control, adjustment of the mA and/or kV according to patient size and/or use of iterative reconstruction technique. COMPARISON:  None Available. FINDINGS: CT HEAD FINDINGS  Brain: Normal anatomic configuration. Parenchymal volume loss is commensurate with the patient's age. Mild periventricular white matter changes are present likely reflecting the sequela of small vessel ischemia. No abnormal intra or extra-axial mass lesion or fluid collection. No abnormal mass effect or midline shift. No evidence of acute intracranial hemorrhage or infarct. Ventricular size is normal. Cerebellum unremarkable. Vascular: No asymmetric hyperdense vasculature at the skull base. Skull: Intact Sinuses/Orbits: Paranasal sinuses are clear. Orbits are unremarkable. Other: Mastoid air cells and middle ear cavities are clear. CT CERVICAL SPINE FINDINGS Alignment: 3 mm retrolisthesis C6-7. Otherwise straightening of the cervical spine. Skull base and vertebrae: Craniocervical alignment is normal. The atlantodental interval is not widened. No acute fracture of the cervical spine. Vertebral body height is preserved. Degenerative ankylosis of C6-7 vertebral bodies. Soft tissues and spinal canal: No prevertebral fluid or swelling. No visible canal hematoma. Broad-based posterior disc osteophyte complex at C5-6 results in moderate central canal stenosis with mild flattening of the thecal sac and AP diameter of the spinal canal of 7 mm. No high-grade canal stenosis. Advanced vascular calcifications are seen within the carotid bifurcations. Disc levels: Disc space narrowing and endplate remodeling is seen at C5-6 in keeping with changes of advanced degenerative disc disease. Degenerative ankylosis and obliteration of the C6-7 disc space. Milder degenerative changes noted at C4-5. Prevertebral soft tissues are not thickened on sagittal reformats. No significant neuroforaminal narrowing. Upper chest: Negative. Other: None IMPRESSION: 1. No acute intracranial abnormality. No calvarial fracture. 2. No acute fracture or listhesis of the cervical spine. 3. Degenerative disc and degenerative joint disease resulting in  moderate central canal stenosis at C5-6. 4. Advanced vascular calcifications within the carotid bifurcations. Electronically Signed   By: Dorethia Molt M.D.   On: 01/14/2024 00:49   CT Angio Chest PE W and/or Wo Contrast Result Date: 01/14/2024 CLINICAL DATA:  High probability pulmonary embolism, syncope EXAM: CT ANGIOGRAPHY CHEST WITH CONTRAST TECHNIQUE: Multidetector CT imaging of the chest was performed using the standard protocol during bolus administration of intravenous contrast. Multiplanar CT image reconstructions and MIPs were obtained to evaluate the vascular anatomy. RADIATION DOSE REDUCTION: This exam was performed according to the departmental dose-optimization program which includes automated exposure control, adjustment of the mA and/or kV according to patient size and/or use of iterative reconstruction technique. CONTRAST:  75mL OMNIPAQUE  IOHEXOL  350 MG/ML SOLN COMPARISON:  11/12/2023 FINDINGS: Cardiovascular: Advanced aortic and mitral valvular calcification. Extensive multi-vessel coronary artery calcification. Cardiac size is mildly enlarged. Adequate opacification of the pulmonary arterial tree. No intraluminal filling defect identified to suggest acute pulmonary embolism. Central pulmonary arteries are of normal caliber. No pericardial effusion. Mild atherosclerotic calcification within the thoracic aorta. Stable fusiform dilation of the ascending aorta measuring 4.1 cm in maximal diameter. Mediastinum/Nodes: No enlarged mediastinal, hilar, or axillary lymph nodes. Thyroid gland, trachea, and esophagus demonstrate no significant findings. Lungs/Pleura: Mosaic attenuation of the pulmonary parenchyma is in keeping with multifocal air trapping related to small airways disease. There is extensive airway impaction left lower lobe with complete collapse of the left lower lobe. Mild right basilar dependent atelectasis. No pneumothorax. Trace right pleural effusion Upper Abdomen: Cholelithiasis  without pericholecystic inflammatory change. No acute abnormality within the visualized upper abdomen. Musculoskeletal: No chest wall abnormality. No acute or significant osseous findings. Review of the MIP images confirms the above findings. IMPRESSION: 1. No pulmonary embolism. 2. Extensive airway impaction with complete collapse of the left lower lobe. 3. 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 4. Extensive multi-vessel coronary artery calcification. 5. Advanced aortic and mitral valvular calcification. Echocardiography may be helpful to assess the degree of valvular dysfunction 6. Stable fusiform dilation of the ascending aorta measuring 4.1 cm in maximal diameter. Recommend annual imaging followup by CTA or MRA. This recommendation follows 2010 ACCF/AHA/AATS/ACR/ASA/SCA/SCAI/SIR/STS/SVM Guidelines for the Diagnosis and Management of Patients with Thoracic Aortic Disease. Circulation. 2010; 121: [id number]. Aortic aneurysm NOS (ICD10-I71.9) 7. Cholelithiasis. 8. Aortic atherosclerosis. Aortic Atherosclerosis (ICD10-I70.0). Electronically Signed   By: Dorethia Molt M.D.   On: 01/14/2024 00:39   DG Elbow Complete Left Result Date: 01/14/2024 CLINICAL DATA:  Fall, left elbow injury EXAM: LEFT ELBOW - COMPLETE 3+ VIEW COMPARISON:  None Available. FINDINGS: There is no evidence of fracture, dislocation, or joint effusion. There is no evidence of arthropathy or other focal bone abnormality. Vascular calcifications are noted. IMPRESSION: 1. Negative. Electronically Signed   By: Dorethia Molt M.D.   On: 01/14/2024 00:20   DG Wrist Complete Left Result Date: 01/14/2024 CLINICAL DATA:  Chest pain, dyspnea EXAM: LEFT WRIST - COMPLETE 3+ VIEW COMPARISON:  None Available. FINDINGS: Normal alignment. No acute fracture or dislocation. Mild polyarticular degenerative changes. Advanced vascular calcifications are noted. IMPRESSION: 1. Mild  polyarticular degenerative change. Electronically Signed   By: Dorethia Molt M.D.   On: 01/14/2024 00:19   DG Hand Complete Left Result Date: 01/14/2024 CLINICAL DATA:  Fall, left hand injury EXAM: LEFT HAND - COMPLETE 3+ VIEW COMPARISON:  None Available. FINDINGS: There is no evidence of fracture or dislocation. There is no evidence of arthropathy or other focal bone abnormality. Vascular calcifications are noted. IMPRESSION: 1. Negative. Electronically Signed   By: Dorethia Molt M.D.   On: 01/14/2024 00:16     Medications:    azithromycin      cefTRIAXone  (ROCEPHIN )  IV      aspirin  EC  81 mg Oral Daily   budesonide  (PULMICORT ) nebulizer solution  0.5 mg Nebulization BID   Chlorhexidine  Gluconate Cloth  6 each Topical Q0600   DULoxetine   30 mg Oral Daily   famotidine   10 mg Oral Daily   guaiFENesin   600 mg Oral BID   heparin  injection (subcutaneous)  5,000 Units Subcutaneous Q8H   ipratropium-albuterol   3 mL Nebulization Q4H   melatonin  2.5 mg Oral QHS   methylPREDNISolone  (SOLU-MEDROL ) injection  10 mg Intravenous Q12H   [START ON 01/15/2024] midodrine   5 mg Oral Q M,W,F-HD   oxyCODONE   10 mg Oral Q12H   pantoprazole   40 mg Oral Daily   traZODone   75 mg Oral QHS   acetaminophen  **OR** acetaminophen , calcium  carbonate, chlorpheniramine-HYDROcodone , magnesium  hydroxide, ondansetron  **OR** ondansetron  (ZOFRAN ) IV, oxyCODONE , topiramate , traZODone   Assessment/ Plan:  [name] is a 68 y.o.  female with past medical history of ESRD with hemodialysis, diabetes mellitus, and hypertension. Patient presents to ED with fall and syncopal. Patient will be admitted for Community acquired pneumonia [J18.9] Acute UTI [N39.0] Aortic stenosis, severe [I35.0] Injury of head, initial encounter [S09.90XA] Syncope, unspecified syncope type [R55] Chest pain, unspecified type [R07.9]  [facility]/MWF/Lt AVF  End stage renal  disease on hemodialysis. Last treatment completed on Friday.  Receiving treatment today, UF goal 1.5-2L as tolerated. Next treatment scheduled for Wednesday, to maintain outpatient schedule.   2. Pneumonia, sepsis workup in progress. CT chest concerning for left lower lobe pneumonia with collapse. Primary team has ordered IV Rocephin  and Azithromycin .   3. Anemia of chronic kidney disease Lab Results  Component Value Date   HGB 9.6 (L) 01/14/2024    Hgb stable, will consider low dose EPO with dialysis.   4. Secondary Hyperparathyroidism: with outpatient labs: PTH 507, phosphorus 3.9, calcium  8.5 on 12/23/23.   Lab Results  Component Value Date   PTH 70 (H) 05/12/2020   CALCIUM  8.6 (L) 01/14/2024   CAION 1.13 (L) 08/15/2023   PHOS 4.4 12/16/2023    Bone minerals within optimal range. Will continue to monitor.   5. 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 Most recent hemoglobin A1c is 4.7 on 09/23/21.     LOS: 0 Arty Lantzy 9/2/20251:47 PM

## 2024-01-15 DIAGNOSIS — N186 End stage renal disease: Secondary | ICD-10-CM | POA: Diagnosis not present

## 2024-01-15 DIAGNOSIS — I350 Nonrheumatic aortic (valve) stenosis: Secondary | ICD-10-CM | POA: Diagnosis not present

## 2024-01-15 DIAGNOSIS — R079 Chest pain, unspecified: Secondary | ICD-10-CM

## 2024-01-15 DIAGNOSIS — J9600 Acute respiratory failure, unspecified whether with hypoxia or hypercapnia: Secondary | ICD-10-CM

## 2024-01-15 DIAGNOSIS — J189 Pneumonia, unspecified organism: Secondary | ICD-10-CM | POA: Diagnosis not present

## 2024-01-15 DIAGNOSIS — A419 Sepsis, unspecified organism: Secondary | ICD-10-CM | POA: Diagnosis not present

## 2024-01-15 DIAGNOSIS — S0990XA Unspecified injury of head, initial encounter: Secondary | ICD-10-CM

## 2024-01-15 DIAGNOSIS — R55 Syncope and collapse: Secondary | ICD-10-CM | POA: Diagnosis not present

## 2024-01-15 DIAGNOSIS — N390 Urinary tract infection, site not specified: Secondary | ICD-10-CM

## 2024-01-15 LAB — GLUCOSE, CAPILLARY
Glucose-Capillary: 99 mg/dL (ref 70–99)
Glucose-Capillary: 141 mg/dL — ABNORMAL HIGH (ref 70–99)
Glucose-Capillary: 138 mg/dL — ABNORMAL HIGH (ref 70–99)
Glucose-Capillary: 167 mg/dL — ABNORMAL HIGH (ref 70–99)

## 2024-01-15 LAB — ECHOCARDIOGRAM LIMITED
AR max vel: 0.76 cm2
AV Area VTI: 0.71 cm2
AV Area mean vel: 0.67 cm2
AV Mean grad: 47 mmHg
AV Peak grad: 66.1 mmHg
Ao pk vel: 4.07 m/s
Area-P 1/2: 4.1 cm2
Height: 66 in
MV VTI: 1.46 cm2
P 1/2 time: 322 ms
S' Lateral: 2.8 cm
Weight: 1932.99 [oz_av]

## 2024-01-15 LAB — CBC
HCT: 29.4 % — ABNORMAL LOW (ref 36.0–46.0)
Hemoglobin: 9.4 g/dL — ABNORMAL LOW (ref 12.0–15.0)
MCH: 31.8 pg (ref 26.0–34.0)
MCHC: 32 g/dL (ref 30.0–36.0)
MCV: 99.3 fL (ref 80.0–100.0)
Platelets: 128 K/uL — ABNORMAL LOW (ref 150–400)
RBC: 2.96 MIL/uL — ABNORMAL LOW (ref 3.87–5.11)
RDW: 12.3 % (ref 11.5–15.5)
WBC: 4 K/uL (ref 4.0–10.5)
nRBC: 0 % (ref 0.0–0.2)

## 2024-01-15 LAB — MAGNESIUM: Magnesium: 2 mg/dL (ref 1.7–2.4)

## 2024-01-15 LAB — BASIC METABOLIC PANEL WITH GFR
Anion gap: 12 (ref 5–15)
BUN: 45 mg/dL — ABNORMAL HIGH (ref 8–23)
CO2: 27 mmol/L (ref 22–32)
Calcium: 8.8 mg/dL — ABNORMAL LOW (ref 8.9–10.3)
Chloride: 96 mmol/L — ABNORMAL LOW (ref 98–111)
Creatinine, Ser: 3.18 mg/dL — ABNORMAL HIGH (ref 0.44–1.00)
GFR, Estimated: 16 mL/min — ABNORMAL LOW
Glucose, Bld: 113 mg/dL — ABNORMAL HIGH (ref 70–99)
Potassium: 4.7 mmol/L (ref 3.5–5.1)
Sodium: 135 mmol/L (ref 135–145)

## 2024-01-15 LAB — HEMOGLOBIN A1C
Hgb A1c MFr Bld: 4.5 % — ABNORMAL LOW (ref 4.8–5.6)
Mean Plasma Glucose: 82 mg/dL

## 2024-01-15 LAB — PHOSPHORUS: Phosphorus: 4.1 mg/dL (ref 2.5–4.6)

## 2024-01-15 MED ORDER — DIPHENHYDRAMINE HCL 50 MG/ML IJ SOLN
25.0000 mg | INTRAMUSCULAR | Status: DC | PRN
  Administered 2024-01-15: 25 mg via INTRAVENOUS

## 2024-01-15 MED ORDER — LIDOCAINE-PRILOCAINE 2.5-2.5 % EX CREA: 1.0000 | TOPICAL_CREAM | CUTANEOUS | Status: DC | PRN

## 2024-01-15 MED ORDER — HEPARIN SODIUM (PORCINE) 1000 UNIT/ML DIALYSIS: 1000.0000 [IU] | INTRAMUSCULAR | Status: DC | PRN

## 2024-01-15 MED ORDER — PENTAFLUOROPROP-TETRAFLUOROETH EX AERO: 1.0000 | INHALATION_SPRAY | CUTANEOUS | Status: DC | PRN

## 2024-01-15 MED ORDER — ONDANSETRON HCL 4 MG/2ML IJ SOLN
INTRAMUSCULAR | Status: AC
  Filled 2024-01-15: qty 2

## 2024-01-15 MED ORDER — EPOETIN ALFA-EPBX 4000 UNIT/ML IJ SOLN
4000.0000 [IU] | INTRAMUSCULAR | Status: DC
  Administered 2024-01-15: 4000 [IU] via INTRAVENOUS

## 2024-01-15 MED ORDER — LIDOCAINE-PRILOCAINE 2.5-2.5 % EX CREA
1.0000 | TOPICAL_CREAM | CUTANEOUS | Status: DC | PRN
Start: 2024-01-15 — End: 2024-01-15

## 2024-01-15 MED ORDER — SODIUM CHLORIDE 0.9 % IV SOLN
2.0000 g | INTRAVENOUS | Status: DC
  Administered 2024-01-15: 2 g via INTRAVENOUS
  Filled 2024-01-15: qty 20

## 2024-01-15 MED ORDER — DIPHENHYDRAMINE HCL 50 MG/ML IJ SOLN
INTRAMUSCULAR | Status: AC
Start: 2024-01-15 — End: 2024-01-15
  Filled 2024-01-15: qty 1

## 2024-01-15 MED ORDER — EPOETIN ALFA 4000 UNIT/ML IJ SOLN
INTRAMUSCULAR | Status: AC
  Filled 2024-01-15: qty 1

## 2024-01-15 NOTE — Progress Notes (Signed)
 Harlene, with [facility] is following this patient through [facility].  She came to visit patient and stated they would resume care once patient was discharged from hospital.

## 2024-01-15 NOTE — Care Management Important Message (Signed)
 Important Message  Patient Details  Name: Katie [name] MRN: [id number] Date of Birth: 1956/05/07   Important Message Given:        [name] 01/15/2024, 11:53 AM

## 2024-01-15 NOTE — Progress Notes (Signed)
 [facility]  ROUNDING NOTE   Subjective:   Katie [name] is a 68 year old female with past medical history of ESRD with hemodialysis, diabetes mellitus, and hypertension. Patient presents to ED with fall and syncopal. Patient will be admitted for Community acquired pneumonia [J18.9] Acute UTI [N39.0] Aortic stenosis, severe [I35.0] Injury of head, initial encounter [S09.90XA] Syncope, unspecified syncope type [R55] Chest pain, unspecified type [R07.9]  Patient is known to our practice and receives outpatient dialysis treatments at [facility] on a MWF schedule.   Update: Patient seen and evaluated during dialysis   HEMODIALYSIS FLOWSHEET:  Blood Flow Rate (mL/min): 400 mL/min Arterial Pressure (mmHg): -140 mmHg Venous Pressure (mmHg): 240 mmHg TMP (mmHg): 1 mmHg Ultrafiltration Rate (mL/min): 828 mL/min Dialysate Flow Rate (mL/min): 300 ml/min  Requesting benadryl  and zofran  for itching during treatment.    Objective:  Vital signs in last 24 hours:  Temp:  [98 F (36.7 C)-99.2 F (37.3 C)] 98.1 F (36.7 C) (09/03 0920) Pulse Rate:  [73-102] 96 (09/03 0930) Resp:  [11-22] 15 (09/03 0930) BP: (121-182)/(48-70) 142/66 (09/03 0930) SpO2:  [95 %-100 %] 98 % (09/03 0930) Weight:  [54.8 kg] 54.8 kg (09/03 0920)  Weight change: -3.075 kg Filed Weights   01/14/24 0900 01/14/24 1230 01/15/24 0920  Weight: 56.8 kg 54.8 kg 54.8 kg    Intake/Output: I/O last 3 completed shifts: In: 1180 [P.O.:480; IV Piggyback:700] Out: 2000 [Other:2000]   Intake/Output this shift:  No intake/output data recorded.  Physical Exam: General: NAD  Head: Normocephalic, atraumatic. Moist oral mucosal membranes  Eyes: Anicteric  Lungs:  Clear to auscultation, room air  Heart: Regular rate and rhythm  Abdomen:  Soft, nontender  Extremities:  No peripheral edema.  Neurologic: Awake, alert, conversant  Skin: Warm,dry, no rash  Access: Lt AVF    Basic Metabolic  Panel: Recent Labs  Lab 01/13/24 2259 01/14/24 0452 01/15/24 0327  NA 136 135 135  K 5.3* 4.5 4.7  CL 101 102 96*  CO2 24 24 27   GLUCOSE 84 152* 113*  BUN 86* 85* 45*  CREATININE 4.77* 4.60* 3.18*  CALCIUM  9.1 8.6* 8.8*  MG  --   --  2.0  PHOS  --   --  4.1    Liver Function Tests: Recent Labs  Lab 01/13/24 2259  AST 38  ALT 58*  ALKPHOS 113  BILITOT 0.9  PROT 6.8  ALBUMIN  3.2*   No results for input(s): LIPASE, AMYLASE in the last 168 hours. No results for input(s): AMMONIA in the last 168 hours.  CBC: Recent Labs  Lab 01/13/24 2259 01/14/24 0452 01/15/24 0327  WBC 5.9 5.0 4.0  NEUTROABS 4.0  --   --   HGB 10.9* 9.6* 9.4*  HCT 35.1* 30.0* 29.4*  MCV 102.0* 100.7* 99.3  PLT 139* 120* 128*    Cardiac Enzymes: No results for input(s): CKTOTAL, CKMB, CKMBINDEX, TROPONINI in the last 168 hours.  BNP: Invalid input(s): POCBNP  CBG: Recent Labs  Lab 01/13/24 2316 01/14/24 1643 01/14/24 2113 01/15/24 0730  GLUCAP 83 121* 110* 99    Microbiology: Results for orders placed or performed during the hospital encounter of 01/13/24  Urine Culture     Status: Abnormal (Preliminary result)   Collection Time: 01/13/24 11:07 PM   Specimen: Urine, Random  Result Value Ref Range Status   Specimen Description   Final    URINE, RANDOM Performed at [facility], [address], [place], [place] [address]    Special Requests  Final    NONE Reflexed from [id number] Performed at [facility], [address] [place], [place] [address]    Culture >=100,000 COLONIES/mL GRAM NEGATIVE RODS (A)  Final   Report Status PENDING  Incomplete  Blood culture (single)     Status: None (Preliminary result)   Collection Time: 01/13/24 11:32 PM   Specimen: BLOOD  Result Value Ref Range Status   Specimen Description BLOOD BLOOD RIGHT ARM  Final   Special Requests   Final    BOTTLES DRAWN AEROBIC AND ANAEROBIC Blood Culture adequate volume    Culture   Final    NO GROWTH < 12 HOURS Performed at [facility], [address], [place], [place] [address]    Report Status PENDING  Incomplete    Coagulation Studies: Recent Labs    01/14/24 0452  LABPROT 14.2  INR 1.0    Urinalysis: Recent Labs    01/13/24 2307  COLORURINE AMBER*  LABSPEC 1.012  PHURINE 6.0  GLUCOSEU NEGATIVE  HGBUR NEGATIVE  BILIRUBINUR NEGATIVE  KETONESUR NEGATIVE  PROTEINUR 100*  NITRITE NEGATIVE  LEUKOCYTESUR LARGE*      Imaging: ECHOCARDIOGRAM LIMITED Result Date: 01/15/2024    ECHOCARDIOGRAM LIMITED REPORT   Patient Name:   [facility] Date of Exam: 01/14/2024 Medical Rec #:  [id number]    Height:       66.0 in Accession #:    [id number]   Weight:       120.8 lb Date of Birth:  25-Sep-1955   BSA:          1.614 m Patient Age:    67 years     BP:           160/62 mmHg Patient Gender: F            HR:           92 bpm. Exam Location:  [facility] Procedure: 2D Echo, Limited Echo, 3D Echo, Limited Color Doppler, Cardiac            Doppler and Strain Analysis (Both Spectral and Color Flow Doppler            were utilized during procedure). Indications:     I35.0 Aortic Stenosis  History:         Patient has prior history of Echocardiogram examinations, most                  recent 06/20/2023. COPD; Risk Factors:Diabetes, Former Smoker and                  Hypertension. End Stage Renal Disease-Hemodialysis.  Sonographer:     Carl Coma RDCS Referring Phys:  [id number] ELVAN SOR Diagnosing Phys: Lonni Hanson MD IMPRESSIONS  1. Left ventricular ejection fraction, by estimation, is 60 to 65%. Left ventricular ejection fraction by 3D volume is 52 %. The left ventricle has normal function. There is moderate left ventricular hypertrophy. Left ventricular diastolic parameters are indeterminate. The average left ventricular global longitudinal strain is -20.0 %. The global longitudinal strain is normal.  2. Right ventricular systolic function is normal.  The right ventricular size is normal. Mildly increased right ventricular wall thickness. There is severely elevated pulmonary artery systolic pressure. The estimated right ventricular systolic pressure is 69.5 mmHg.  3. Trivial mitral valve regurgitation. Moderate mitral stenosis; transvalvular gradient likely accentuated by high-flow state. The mean mitral valve gradient is 10.0 mmHg.  4. Tricuspid valve regurgitation is mild to moderate.  5. The aortic  valve has an indeterminant number of cusps. There is moderate calcification of the aortic valve. There is severe thickening of the aortic valve. Aortic valve regurgitation is moderate. Severe aortic valve stenosis. Aortic valve area, by VTI measures 0.71 cm. Aortic valve mean gradient measures 47.0 mmHg.  6. The inferior vena cava is normal in size with <50% respiratory variability, suggesting right atrial pressure of 8 mmHg. FINDINGS  Left Ventricle: Left ventricular ejection fraction, by estimation, is 60 to 65%. Left ventricular ejection fraction by 3D volume is 52 %. The left ventricle has normal function. The average left ventricular global longitudinal strain is -20.0 %. Strain was performed and the global longitudinal strain is normal. The left ventricular internal cavity size was normal in size. There is moderate left ventricular hypertrophy. Left ventricular diastolic parameters are indeterminate. Right Ventricle: The right ventricular size is normal. Mildly increased right ventricular wall thickness. Right ventricular systolic function is normal. There is severely elevated pulmonary artery systolic pressure. The tricuspid regurgitant velocity is 3.92 m/s, and with an assumed right atrial pressure of 8 mmHg, the estimated right ventricular systolic pressure is 69.5 mmHg. Pericardium: Trivial pericardial effusion is present. There is moderate thickening of the mitral valve leaflet(s). Trivial mitral valve regurgitation. Moderate mitral valve stenosis.  Tricuspid Valve: The tricuspid valve is normal in structure. Tricuspid valve regurgitation is mild to moderate. The aortic valve has an indeterminant number of cusps. There is moderate calcification of the aortic valve. There is severe thickening of the aortic valve. Aortic valve regurgitation is moderate. Severe aortic stenosis is present. Aorta: The aortic root is normal in size and structure. Venous: The inferior vena cava is normal in size with less than 50% respiratory variability, suggesting right atrial pressure of 8 mmHg. Additional Comments: 3D was performed not requiring image post processing on an independent workstation and was normal.  LEFT VENTRICLE PLAX 2D LVIDd:         3.90 cm         Diastology LVIDs:         2.80 cm         LV e' medial:    5.22 cm/s LV PW:         1.50 cm         LV E/e' medial:  26.1 LV IVS:        1.40 cm         LV e' lateral:   5.44 cm/s LVOT diam:     1.80 cm         LV E/e' lateral: 25.1 LV SV:         60 LV SV Index:   37              2D Longitudinal LVOT Area:     2.54 cm        Strain                                2D Strain GLS   -19.1 %                                (A4C):                                2D Strain GLS   -20.7 %                                (  A3C):                                2D Strain GLS   -20.2 %                                (A2C):                                2D Strain GLS   -20.0 %                                Avg:                                 3D Volume EF                                LV 3D EF:    Left                                             ventricul                                             ar                                             ejection                                             fraction                                             by 3D                                             volume is                                             52 %.                                 3D Volume EF:                                 3D  EF:        52 %                                LV EDV:       149 ml                                LV ESV:       72 ml                                LV SV:        78 ml RIGHT VENTRICLE             IVC RV S prime:     14.00 cm/s  IVC diam: 1.10 cm TAPSE (M-mode): 2.8 cm LEFT ATRIUM         Index LA diam:    4.60 cm 2.85 cm/m  AORTIC VALVE AV Area (Vmax):    0.76 cm AV Area (Vmean):   0.67 cm AV Area (VTI):     0.71 cm AV Vmax:           406.60 cm/s AV Vmean:          314.200 cm/s AV VTI:            0.851 m AV Peak Grad:      66.1 mmHg AV Mean Grad:      47.0 mmHg LVOT Vmax:         121.00 cm/s LVOT Vmean:        83.267 cm/s LVOT VTI:          0.237 m LVOT/AV VTI ratio: 0.28 AI PHT:            322 msec  AORTA Ao Root diam: 3.20 cm MITRAL VALVE                TRICUSPID VALVE MV Area (PHT): 4.10 cm     TR Peak grad:   61.5 mmHg MV Area VTI:   1.46 cm     TR Vmax:        392.00 cm/s MV Peak grad:  19.9 mmHg MV Mean grad:  10.0 mmHg    SHUNTS MV Vmax:       2.23 m/s     Systemic VTI:  0.24 m MV Vmean:      148.0 cm/s   Systemic Diam: 1.80 cm MV Decel Time: 185 msec MV E velocity: 136.50 cm/s MV A velocity: 203.00 cm/s MV E/A ratio:  0.67 Lonni End MD Electronically signed by Lonni Hanson MD Signature Date/Time: 01/15/2024/7:12:29 AM    Final    CT HEAD WO CONTRAST ( ) Result Date: 01/14/2024 CLINICAL DATA:  Neck trauma (Age >= 65y); Head trauma, minor (Age >= 65y). Dizziness, syncope, fall EXAM: CT HEAD WITHOUT CONTRAST CT CERVICAL SPINE WITHOUT CONTRAST TECHNIQUE: Multidetector CT imaging of the head and cervical spine was performed following the standard protocol without intravenous contrast. Multiplanar CT image reconstructions of the cervical spine were also generated. RADIATION DOSE REDUCTION: This exam was performed according to the departmental dose-optimization program which includes automated exposure control, adjustment of the mA and/or kV according to patient size and/or use of iterative  reconstruction technique. COMPARISON:  None Available. FINDINGS: CT HEAD FINDINGS Brain: Normal anatomic configuration. Parenchymal volume loss  is commensurate with the patient's age. Mild periventricular white matter changes are present likely reflecting the sequela of small vessel ischemia. No abnormal intra or extra-axial mass lesion or fluid collection. No abnormal mass effect or midline shift. No evidence of acute intracranial hemorrhage or infarct. Ventricular size is normal. Cerebellum unremarkable. Vascular: No asymmetric hyperdense vasculature at the skull base. Skull: Intact Sinuses/Orbits: Paranasal sinuses are clear. Orbits are unremarkable. Other: Mastoid air cells and middle ear cavities are clear. CT CERVICAL SPINE FINDINGS Alignment: 3 mm retrolisthesis C6-7. Otherwise straightening of the cervical spine. Skull base and vertebrae: Craniocervical alignment is normal. The atlantodental interval is not widened. No acute fracture of the cervical spine. Vertebral body height is preserved. Degenerative ankylosis of C6-7 vertebral bodies. Soft tissues and spinal canal: No prevertebral fluid or swelling. No visible canal hematoma. Broad-based posterior disc osteophyte complex at C5-6 results in moderate central canal stenosis with mild flattening of the thecal sac and AP diameter of the spinal canal of 7 mm. No high-grade canal stenosis. Advanced vascular calcifications are seen within the carotid bifurcations. Disc levels: Disc space narrowing and endplate remodeling is seen at C5-6 in keeping with changes of advanced degenerative disc disease. Degenerative ankylosis and obliteration of the C6-7 disc space. Milder degenerative changes noted at C4-5. Prevertebral soft tissues are not thickened on sagittal reformats. No significant neuroforaminal narrowing. Upper chest: Negative. Other: None IMPRESSION: 1. No acute intracranial abnormality. No calvarial fracture. 2. No acute fracture or listhesis of the  cervical spine. 3. 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 4. Advanced vascular calcifications within the carotid bifurcations. Electronically Signed   By: Dorethia Molt M.D.   On: 01/14/2024 00:49   CT Cervical Spine Wo Contrast Result Date: 01/14/2024 CLINICAL DATA:  Neck trauma (Age >= 65y); Head trauma, minor (Age >= 65y). Dizziness, syncope, fall EXAM: CT HEAD WITHOUT CONTRAST CT CERVICAL SPINE WITHOUT CONTRAST TECHNIQUE: Multidetector CT imaging of the head and cervical spine was performed following the standard protocol without intravenous contrast. Multiplanar CT image reconstructions of the cervical spine were also generated. RADIATION DOSE REDUCTION: This exam was performed according to the departmental dose-optimization program which includes automated exposure control, adjustment of the mA and/or kV according to patient size and/or use of iterative reconstruction technique. COMPARISON:  None Available. FINDINGS: CT HEAD FINDINGS Brain: Normal anatomic configuration. Parenchymal volume loss is commensurate with the patient's age. Mild periventricular white matter changes are present likely reflecting the sequela of small vessel ischemia. No abnormal intra or extra-axial mass lesion or fluid collection. No abnormal mass effect or midline shift. No evidence of acute intracranial hemorrhage or infarct. Ventricular size is normal. Cerebellum unremarkable. Vascular: No asymmetric hyperdense vasculature at the skull base. Skull: Intact Sinuses/Orbits: Paranasal sinuses are clear. Orbits are unremarkable. Other: Mastoid air cells and middle ear cavities are clear. CT CERVICAL SPINE FINDINGS Alignment: 3 mm retrolisthesis C6-7. Otherwise straightening of the cervical spine. Skull base and vertebrae: Craniocervical alignment is normal. The atlantodental interval is not widened. No acute fracture of the cervical spine. Vertebral body height is  preserved. Degenerative ankylosis of C6-7 vertebral bodies. Soft tissues and spinal canal: No prevertebral fluid or swelling. No visible canal hematoma. Broad-based posterior disc osteophyte complex at C5-6 results in moderate central canal stenosis with mild flattening of the thecal sac and AP diameter of the spinal canal of 7 mm. No high-grade canal stenosis. Advanced vascular calcifications are seen within the carotid bifurcations. Disc levels:  Disc space narrowing and endplate remodeling is seen at C5-6 in keeping with changes of advanced degenerative disc disease. Degenerative ankylosis and obliteration of the C6-7 disc space. Milder degenerative changes noted at C4-5. Prevertebral soft tissues are not thickened on sagittal reformats. No significant neuroforaminal narrowing. Upper chest: Negative. Other: None IMPRESSION: 1. No acute intracranial abnormality. No calvarial fracture. 2. No acute fracture or listhesis of the cervical spine. 3. 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 4. Advanced vascular calcifications within the carotid bifurcations. Electronically Signed   By: Dorethia Molt M.D.   On: 01/14/2024 00:49   CT Angio Chest PE W and/or Wo Contrast Result Date: 01/14/2024 CLINICAL DATA:  High probability pulmonary embolism, syncope EXAM: CT ANGIOGRAPHY CHEST WITH CONTRAST TECHNIQUE: Multidetector CT imaging of the chest was performed using the standard protocol during bolus administration of intravenous contrast. Multiplanar CT image reconstructions and MIPs were obtained to evaluate the vascular anatomy. RADIATION DOSE REDUCTION: This exam was performed according to the departmental dose-optimization program which includes automated exposure control, adjustment of the mA and/or kV according to patient size and/or use of iterative reconstruction technique. CONTRAST:  75mL OMNIPAQUE  IOHEXOL  350 MG/ML SOLN COMPARISON:  11/12/2023 FINDINGS:  Cardiovascular: Advanced aortic and mitral valvular calcification. Extensive multi-vessel coronary artery calcification. Cardiac size is mildly enlarged. Adequate opacification of the pulmonary arterial tree. No intraluminal filling defect identified to suggest acute pulmonary embolism. Central pulmonary arteries are of normal caliber. No pericardial effusion. Mild atherosclerotic calcification within the thoracic aorta. Stable fusiform dilation of the ascending aorta measuring 4.1 cm in maximal diameter. Mediastinum/Nodes: No enlarged mediastinal, hilar, or axillary lymph nodes. Thyroid gland, trachea, and esophagus demonstrate no significant findings. Lungs/Pleura: Mosaic attenuation of the pulmonary parenchyma is in keeping with multifocal air trapping related to small airways disease. There is extensive airway impaction left lower lobe with complete collapse of the left lower lobe. Mild right basilar dependent atelectasis. No pneumothorax. Trace right pleural effusion Upper Abdomen: Cholelithiasis without pericholecystic inflammatory change. No acute abnormality within the visualized upper abdomen. Musculoskeletal: No chest wall abnormality. No acute or significant osseous findings. Review of the MIP images confirms the above findings. IMPRESSION: 1. No pulmonary embolism. 2. Extensive airway impaction with complete collapse of the left lower lobe. 3. 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 4. Extensive multi-vessel coronary artery calcification. 5. Advanced aortic and mitral valvular calcification. Echocardiography may be helpful to assess the degree of valvular dysfunction 6. Stable fusiform dilation of the ascending aorta measuring 4.1 cm in maximal diameter. Recommend annual imaging followup by CTA or MRA. This recommendation follows 2010 ACCF/AHA/AATS/ACR/ASA/SCA/SCAI/SIR/STS/SVM Guidelines for the Diagnosis and Management of  Patients with Thoracic Aortic Disease. Circulation. 2010; 121: [id number]. Aortic aneurysm NOS (ICD10-I71.9) 7. Cholelithiasis. 8. Aortic atherosclerosis. Aortic Atherosclerosis (ICD10-I70.0). Electronically Signed   By: Dorethia Molt M.D.   On: 01/14/2024 00:39   DG Elbow Complete Left Result Date: 01/14/2024 CLINICAL DATA:  Fall, left elbow injury EXAM: LEFT ELBOW - COMPLETE 3+ VIEW COMPARISON:  None Available. FINDINGS: There is no evidence of fracture, dislocation, or joint effusion. There is no evidence of arthropathy or other focal bone abnormality. Vascular calcifications are noted. IMPRESSION: 1. Negative. Electronically Signed   By: Dorethia Molt M.D.   On: 01/14/2024 00:20   DG Wrist Complete Left Result Date: 01/14/2024 CLINICAL DATA:  Chest pain, dyspnea EXAM: LEFT WRIST - COMPLETE 3+ VIEW COMPARISON:  None Available. FINDINGS: Normal alignment. No acute fracture or dislocation. Mild polyarticular degenerative changes. Advanced vascular calcifications are noted. IMPRESSION: 1. Mild polyarticular degenerative change. Electronically Signed   By: Dorethia Molt M.D.   On: 01/14/2024 00:19   DG Hand Complete Left Result Date: 01/14/2024 CLINICAL DATA:  Fall, left hand injury EXAM: LEFT HAND - COMPLETE 3+ VIEW COMPARISON:  None Available. FINDINGS: There is no evidence of fracture or dislocation. There is no evidence of arthropathy or other focal bone abnormality. Vascular calcifications are noted. IMPRESSION: 1. Negative. Electronically Signed   By: Dorethia Molt M.D.   On: 01/14/2024 00:16     Medications:    azithromycin  500 mg (01/14/24 2352)   cefTRIAXone  (ROCEPHIN )  IV      aspirin  EC  81 mg Oral Daily   budesonide  (PULMICORT ) nebulizer solution  0.5 mg Nebulization BID   calcitRIOL   0.25 mcg Oral Daily   Chlorhexidine  Gluconate Cloth  6 each Topical Q0600   DULoxetine   30 mg Oral Daily   famotidine   10 mg Oral Daily   fluticasone  furoate-vilanterol  1 puff Inhalation Daily    guaiFENesin   600 mg Oral BID   heparin  injection (subcutaneous)  5,000 Units Subcutaneous Q8H   insulin  aspart  0-9 Units Subcutaneous TID WC   ipratropium-albuterol   3 mL Nebulization TID   melatonin  2.5 mg Oral QHS   methylPREDNISolone  (SOLU-MEDROL ) injection  10 mg Intravenous Q12H   midodrine   5 mg Oral Q M,W,F-HD   pantoprazole   40 mg Oral Daily   traZODone   75 mg Oral QHS   acetaminophen  **OR** acetaminophen , calcium  carbonate, chlorpheniramine-HYDROcodone , diphenhydrAMINE , heparin , heparin , lidocaine -prilocaine , lidocaine -prilocaine , magnesium  hydroxide, ondansetron  **OR** ondansetron  (ZOFRAN ) IV, oxyCODONE , pentafluoroprop-tetrafluoroeth, pentafluoroprop-tetrafluoroeth, topiramate , traZODone   Assessment/ Plan:  Ms. Shavonda Wiedman is a 68 y.o.  female with past medical history of ESRD with hemodialysis, diabetes mellitus, and hypertension. Patient presents to ED with fall and syncopal. Patient will be admitted for Community acquired pneumonia [J18.9] Acute UTI [N39.0] Aortic stenosis, severe [I35.0] Injury of head, initial encounter [S09.90XA] Syncope, unspecified syncope type [R55] Chest pain, unspecified type [R07.9]  [facility]/MWF/Lt AVF  End stage renal disease on hemodialysis. Dialysis received yesterday, UF 2L achieved. Receiving dialysis today, UF goal 1.5L as tolerated. Next treatment scheduled for Friday.   2. Pneumonia, sepsis workup in progress. CT chest concerning for left lower lobe pneumonia with collapse. Primary team has ordered IV Rocephin  and Azithromycin .   3. Anemia of chronic kidney disease Lab Results  Component Value Date   HGB 9.4 (L) 01/15/2024    Hgb stable, will order low dose Retacrit  4000 units with dialysis.   4. Secondary Hyperparathyroidism: with outpatient labs: PTH 507, phosphorus 3.9, calcium  8.5 on 12/23/23.   Lab Results  Component Value Date   PTH 70 (H) 05/12/2020   CALCIUM  8.8 (L) 01/15/2024   CAION 1.13 (L) 08/15/2023    PHOS 4.1 01/15/2024    Calcium  and phos within optimal range  5. 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 Most recent hemoglobin A1c is 4.7 on 09/23/21.   Glucose well controlled.    LOS: 1 Ming Kunka 9/3/202510:11 AM

## 2024-01-15 NOTE — Plan of Care (Signed)
   Problem: Elimination: Goal: Will not experience complications related to bowel motility Outcome: Progressing

## 2024-01-15 NOTE — Progress Notes (Signed)
 [facility] Progress Note  Patient: Katie [name]    FMW:[id number]  DOA: 01/13/2024     Date of Service: the patient was seen and examined on 01/15/2024  Chief Complaint  Patient presents with   Fall   Loss of Consciousness   Brief hospital course: Katie [name] is a 68 y.o. Caucasian female with medical history significant for COPD, type II reasonableness, left eye blindness, end-stage renal disease on hemodialysis, essential hypertension, severe aortic stenosis and chronic diastolic CHF, who presented to the emergency room with acute onset of syncope and subsequent fall.  The patient has been having chest pain and dyspnea with associated provide cough with wheeze over the last few days.  She had nausea yesterday without vomiting or diaphoresis.  No head injuries.  She believes she had tactile fever and chills.  She admits to urinary frequency and urgency without dysuria or hematuria or flank pain.  No bleeding diathesis.  She did not miss any hemodialysis sessions.   ED Course: When the patient came to the ER, BP was 175/70 with otherwise normal vital signs.  Labs revealed potassium of 5.3 and BUN of 86 with creatinine 4.77, albumin  3.2 and ALT 58 with otherwise unremarkable CMP.  BNP was 1301.5 and high-sensitivity troponin I was 31.  Lactic acid was 0.7.  CBC showed anemia with macrocytosis and mild thrombocytopenia of 139.  TSH was 1.28.  UA was positive for UTI.  Urine culture was sent.  Blood cultures were ordered. EKG as reviewed by me : EKG showed normal sinus rhythm with rate of 98 with probable left atrial enlargement and peaked T waves anteroseptally with LVH and Q waves inferiorly. Imaging: Noncontrast head CT and C-spine CT scan revealed the following: 1. No acute intracranial abnormality. No calvarial fracture. 2. No acute fracture or listhesis of the cervical spine. 3. 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 4.  Advanced vascular calcifications within the carotid bifurcations.   Chest CTA revealed the following: 1. No pulmonary embolism. 2. Extensive airway impaction with complete collapse of the left lower lobe. 3. 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 4. Extensive multi-vessel coronary artery calcification. 5. Advanced aortic and mitral valvular calcification. Echocardiography may be helpful to assess the degree of valvular dysfunction 6. Stable fusiform dilation of the ascending aorta measuring 4.1 cm in maximal diameter. Recommend annual imaging followup by CTA or MRA. This recommendation follows 2010 ACCF/AHA/AATS/ACR/ASA/SCA/SCAI/SIR/STS/SVM Guidelines for the Diagnosis and Management of Patients with Thoracic Aortic Disease. Circulation. 2010; 121: Z733-z630. Aortic aneurysm NOS (ICD10-I71.9) 7. Cholelithiasis. 8. Aortic atherosclerosis.   The patient was given IV Rocephin  and Zithromax , 50 mcg of IV fentanyl  and 4 mg of IV Zofran  as well as DuoNeb.  She will be admitted to a progressive unit bed for further evaluation and management.   Assessment and Plan:  # Sepsis due to pneumonia  # Community-acquired pneumonia, LLL with subsequent collapse as well as acute lower UTI. # Acute hypoxic respiratory failure due to pneumonia S/p IVF, d/c'd vitals stable, no need of IVF  Continue supplemental O2 inhalation and gradually wean off - Continue IV Rocephin  and Zithromax . - Mucinex  600 mg p.o. twice daily - follow blood cultures NGTD and sputum culture Pulmonologist consulted, recommended no need of bronc, continue antibiotics and steroids. Started Solu-Medrol  10 mg IV twice daily DuoNeb every 6 hourly, Pulmicort  twice daily Breo Ellipta  inhaler  UTI, Ucx growing  E. Coli Continue ceftriaxone  Follow urine culture sensitive report    # syncope and fall # Severe aortic valve stenosis. repeat TTE  showed LVEF 60-65%, mod MS, mild to md TR, severe AS  Cardiology consulted for further recommendation As per cards, outpatient plan was for TAVR, but she missed TAVR procedure date d/t lack of transportation  - structural team has been notified and it's felt hospice care may be the best option for the patient. Will defer to structural valve team.    # Type 2 diabetes mellitus with renal manifestations Started NovoLog  sliding Monitor CBG Check hemoglobin A1c    # End-stage renal disease on hemodialysis Nephrology consulted, hemodialysis as per schedule  # GERD without esophagitis - continue H2 blocker therapy and PPI therapy.   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  Started Calcitrol 0.25 mcg p.o. daily. Repeat vitamin D  level after 3 to 6 months  # Abdominal aortic aneurysm 4.1 cm repeat Patient will need annual monitoring. Follow with PCP as an outpatient.  # CAD, continue aspirin  as per cardio    Body mass index is 19.5 kg/m.  Interventions:  Diet: Renal diet, fluid striction 1.5 L per DVT Prophylaxis: Subcutaneous Heparin     Advance goals of care discussion: DNR-intervene  Family Communication: family was not present at bedside, at the time of interview.  The pt provided permission to discuss medical plan with the family. Opportunity was given to ask question and all questions were answered satisfactorily.   Disposition:  Pt is from [facility], admitted with syncope, sepsis and PNA, still on IV Abx, which precludes a safe discharge. Discharge to [facility], when stable, most likely tomorrow am  Subjective: No significant events overnight, still has cough but nothing is coming out.  Denied any chest pain or palpitations, no acute complaints.  Patient was seen during hemodialysis.   Physical Exam: General: NAD, lying comfortably Appear in no distress, affect appropriate Eyes: PERRLA ENT: Oral Mucosa Clear, moist  Neck: no JVD,  Cardiovascular: S1 and S2 Present,  Audible murmur, severe AS  Respiratory: Equal air entry bilaterally, mild bibasilar crackles, no wheezes.   Abdomen: Bowel Sound present, Soft and no tenderness,  Skin: no rashes Extremities: no Pedal edema, no calf tenderness Neurologic: without any new focal findings Gait not checked due to patient safety concerns  Vitals:   01/15/24 1230 01/15/24 1255 01/15/24 1300 01/15/24 1315  BP: (!) 179/63  (!) 170/75 (!) 166/65  Pulse: 90 90 90 92  Resp: (!) 26 20 15  (!) 25  Temp:    97.7 F (36.5 C)  TempSrc:    Oral  SpO2: 98% 99% 98% 99%  Weight:      Height:        Intake/Output Summary (Last 24 hours) at 01/15/2024 1446 Last data filed at 01/15/2024 1315 Gross per 24 hour  Intake 830 ml  Output 2000 ml  Net -1170 ml   Filed Weights   01/14/24 0900 01/14/24 1230 01/15/24 0920  Weight: 56.8 kg 54.8 kg 54.8 kg    Data Reviewed: I have personally reviewed and interpreted daily labs, tele strips, imagings as discussed above. I reviewed all nursing notes, pharmacy notes, vitals, pertinent old records I have discussed plan of care as described above with RN and patient/family.  CBC: Recent Labs  Lab 01/13/24 2259 01/14/24 0452 01/15/24 0327  WBC 5.9 5.0 4.0  NEUTROABS 4.0  --   --   HGB 10.9* 9.6* 9.4*  HCT  35.1* 30.0* 29.4*  MCV 102.0* 100.7* 99.3  PLT 139* 120* 128*   Basic Metabolic Panel: Recent Labs  Lab 01/13/24 2259 01/14/24 0452 01/15/24 0327  NA 136 135 135  K 5.3* 4.5 4.7  CL 101 102 96*  CO2 24 24 27   GLUCOSE 84 152* 113*  BUN 86* 85* 45*  CREATININE 4.77* 4.60* 3.18*  CALCIUM  9.1 8.6* 8.8*  MG  --   --  2.0  PHOS  --   --  4.1    Studies: ECHOCARDIOGRAM LIMITED Result Date: 01/15/2024    ECHOCARDIOGRAM LIMITED REPORT   Patient Name:   [facility] Date of Exam: 01/14/2024 Medical Rec #:  [id number]    Height:       66.0 in Accession #:    [id number]   Weight:       120.8 lb Date of Birth:  July 07, 1955   BSA:          1.614 m Patient Age:    67 years      BP:           160/62 mmHg Patient Gender: F            HR:           92 bpm. Exam Location:  [facility] Procedure: 2D Echo, Limited Echo, 3D Echo, Limited Color Doppler, Cardiac            Doppler and Strain Analysis (Both Spectral and Color Flow Doppler            were utilized during procedure). Indications:     I35.0 Aortic Stenosis  History:         Patient has prior history of Echocardiogram examinations, most                  recent 06/20/2023. COPD; Risk Factors:Diabetes, Former Smoker and                  Hypertension. End Stage Renal Disease-Hemodialysis.  Sonographer:     Carl Coma RDCS Referring Phys:  [id number] ELVAN SOR Diagnosing Phys: Lonni Hanson MD IMPRESSIONS  1. Left ventricular ejection fraction, by estimation, is 60 to 65%. Left ventricular ejection fraction by 3D volume is 52 %. The left ventricle has normal function. There is moderate left ventricular hypertrophy. Left ventricular diastolic parameters are indeterminate. The average left ventricular global longitudinal strain is -20.0 %. The global longitudinal strain is normal.  2. Right ventricular systolic function is normal. The right ventricular size is normal. Mildly increased right ventricular wall thickness. There is severely elevated pulmonary artery systolic pressure. The estimated right ventricular systolic pressure is 69.5 mmHg.  3. Trivial mitral valve regurgitation. Moderate mitral stenosis; transvalvular gradient likely accentuated by high-flow state. The mean mitral valve gradient is 10.0 mmHg.  4. Tricuspid valve regurgitation is mild to moderate.  5. The aortic valve has an indeterminant number of cusps. There is moderate calcification of the aortic valve. There is severe thickening of the aortic valve. Aortic valve regurgitation is moderate. Severe aortic valve stenosis. Aortic valve area, by VTI measures 0.71 cm. Aortic valve mean gradient measures 47.0 mmHg.  6. The inferior vena cava is normal in size with <50%  respiratory variability, suggesting right atrial pressure of 8 mmHg. FINDINGS  Left Ventricle: Left ventricular ejection fraction, by estimation, is 60 to 65%. Left ventricular ejection fraction by 3D volume is 52 %. The left ventricle has normal function. The average left ventricular global longitudinal strain is -  20.0 %. Strain was performed and the global longitudinal strain is normal. The left ventricular internal cavity size was normal in size. There is moderate left ventricular hypertrophy. Left ventricular diastolic parameters are indeterminate. Right Ventricle: The right ventricular size is normal. Mildly increased right ventricular wall thickness. Right ventricular systolic function is normal. There is severely elevated pulmonary artery systolic pressure. The tricuspid regurgitant velocity is 3.92 m/s, and with an assumed right atrial pressure of 8 mmHg, the estimated right ventricular systolic pressure is 69.5 mmHg. Pericardium: Trivial pericardial effusion is present. There is moderate thickening of the mitral valve leaflet(s). Trivial mitral valve regurgitation. Moderate mitral valve stenosis. Tricuspid Valve: The tricuspid valve is normal in structure. Tricuspid valve regurgitation is mild to moderate. The aortic valve has an indeterminant number of cusps. There is moderate calcification of the aortic valve. There is severe thickening of the aortic valve. Aortic valve regurgitation is moderate. Severe aortic stenosis is present. Aorta: The aortic root is normal in size and structure. Venous: The inferior vena cava is normal in size with less than 50% respiratory variability, suggesting right atrial pressure of 8 mmHg. Additional Comments: 3D was performed not requiring image post processing on an independent workstation and was normal.  LEFT VENTRICLE PLAX 2D LVIDd:         3.90 cm         Diastology LVIDs:         2.80 cm         LV e' medial:    5.22 cm/s LV PW:         1.50 cm         LV E/e' medial:   26.1 LV IVS:        1.40 cm         LV e' lateral:   5.44 cm/s LVOT diam:     1.80 cm         LV E/e' lateral: 25.1 LV SV:         60 LV SV Index:   37              2D Longitudinal LVOT Area:     2.54 cm        Strain                                2D Strain GLS   -19.1 %                                (A4C):                                2D Strain GLS   -20.7 %                                (A3C):                                2D Strain GLS   -20.2 %                                (A2C):  2D Strain GLS   -20.0 %                                Avg:                                 3D Volume EF                                LV 3D EF:    Left                                             ventricul                                             ar                                             ejection                                             fraction                                             by 3D                                             volume is                                             52 %.                                 3D Volume EF:                                3D EF:        52 %                                LV EDV:       149 ml                                LV ESV:       72 ml  LV SV:        78 ml RIGHT VENTRICLE             IVC RV S prime:     14.00 cm/s  IVC diam: 1.10 cm TAPSE (M-mode): 2.8 cm LEFT ATRIUM         Index LA diam:    4.60 cm 2.85 cm/m  AORTIC VALVE AV Area (Vmax):    0.76 cm AV Area (Vmean):   0.67 cm AV Area (VTI):     0.71 cm AV Vmax:           406.60 cm/s AV Vmean:          314.200 cm/s AV VTI:            0.851 m AV Peak Grad:      66.1 mmHg AV Mean Grad:      47.0 mmHg LVOT Vmax:         121.00 cm/s LVOT Vmean:        83.267 cm/s LVOT VTI:          0.237 m LVOT/AV VTI ratio: 0.28 AI PHT:            322 msec  AORTA Ao Root diam: 3.20 cm MITRAL VALVE                TRICUSPID VALVE MV Area (PHT): 4.10 cm     TR Peak  grad:   61.5 mmHg MV Area VTI:   1.46 cm     TR Vmax:        392.00 cm/s MV Peak grad:  19.9 mmHg MV Mean grad:  10.0 mmHg    SHUNTS MV Vmax:       2.23 m/s     Systemic VTI:  0.24 m MV Vmean:      148.0 cm/s   Systemic Diam: 1.80 cm MV Decel Time: 185 msec MV E velocity: 136.50 cm/s MV A velocity: 203.00 cm/s MV E/A ratio:  0.67 Christopher End MD Electronically signed by Lonni Hanson MD Signature Date/Time: 01/15/2024/7:12:29 AM    Final     Scheduled Meds:  aspirin  EC  81 mg Oral Daily   budesonide  (PULMICORT ) nebulizer solution  0.5 mg Nebulization BID   calcitRIOL   0.25 mcg Oral Daily   Chlorhexidine  Gluconate Cloth  6 each Topical Q0600   DULoxetine   30 mg Oral Daily   epoetin  alfa-epbx (RETACRIT ) injection  4,000 Units Intravenous Q M,W,F-1800   famotidine   10 mg Oral Daily   fluticasone  furoate-vilanterol  1 puff Inhalation Daily   guaiFENesin   600 mg Oral BID   heparin  injection (subcutaneous)  5,000 Units Subcutaneous Q8H   insulin  aspart  0-9 Units Subcutaneous TID WC   ipratropium-albuterol   3 mL Nebulization TID   melatonin  2.5 mg Oral QHS   methylPREDNISolone  (SOLU-MEDROL ) injection  10 mg Intravenous Q12H   midodrine   5 mg Oral Q M,W,F-HD   pantoprazole   40 mg Oral Daily   traZODone   75 mg Oral QHS   Continuous Infusions:  azithromycin  500 mg (01/14/24 2352)   cefTRIAXone  (ROCEPHIN )  IV     PRN Meds: acetaminophen  **OR** acetaminophen , calcium  carbonate, chlorpheniramine-HYDROcodone , diphenhydrAMINE , magnesium  hydroxide, ondansetron  **OR** ondansetron  (ZOFRAN ) IV, oxyCODONE , topiramate , traZODone   Time spent: 40 minutes  Author: ELVAN SOR. MD [facility] 01/15/2024 2:46 PM  To reach On-call, see care teams to locate the attending and reach out to them via [email or web address]. If 7PM-7AM, please contact night-coverage If you still have  difficulty reaching the attending provider, please page the [facility] (Director on Call) for [facility] on amion for  assistance.

## 2024-01-15 NOTE — Plan of Care (Signed)
  Problem: Fluid Volume: Goal: Hemodynamic stability will improve Outcome: Progressing   Problem: Clinical Measurements: Goal: Signs and symptoms of infection will decrease Outcome: Progressing   Problem: Respiratory: Goal: Ability to maintain adequate ventilation will improve Outcome: Progressing   Problem: Education: Goal: Knowledge of General Education information will improve Description: Including pain rating scale, medication(s)/side effects and non-pharmacologic comfort measures Outcome: Progressing   Problem: Health Behavior/Discharge Planning: Goal: Ability to manage health-related needs will improve Outcome: Progressing   Problem: Activity: Goal: Risk for activity intolerance will decrease Outcome: Progressing   Problem: Nutrition: Goal: Adequate nutrition will be maintained Outcome: Progressing   Problem: Coping: Goal: Level of anxiety will decrease Outcome: Progressing

## 2024-01-15 NOTE — Progress Notes (Signed)
 Progress Note  Patient Name: Katie [name] Date of Encounter: 01/15/2024 [facility] Cardiologist: Redell Cave, MD   Interval Summary    Seen in HD. She was overall OK. No further syncope. Blood pressures elevated .   Vital Signs Vitals:   01/14/24 1919 01/14/24 2312 01/15/24 0607 01/15/24 0733  BP: (!) 175/65 (!) 164/70 (!) 164/70 (!) 176/70  Pulse: 96 84 88 80  Resp: 18 18 18 18   Temp: 98.5 F (36.9 C) 98.3 F (36.8 C) 98.4 F (36.9 C) 98.7 F (37.1 C)  TempSrc:      SpO2: 99% 100% 98% 99%  Weight:      Height:        Intake/Output Summary (Last 24 hours) at 01/15/2024 0802 Last data filed at 01/15/2024 0400 Gross per 24 hour  Intake 830 ml  Output 2000 ml  Net -1170 ml      01/14/2024   12:30 PM 01/14/2024    9:00 AM 01/13/2024   10:53 PM  Last 3 Weights  Weight (lbs) 120 lb 13 oz 125 lb 3.5 oz 132 lb  Weight (kg) 54.8 kg 56.8 kg 59.875 kg      Telemetry/ECG  NSR HR 80s - Personally Reviewed  Physical Exam  GEN: No acute distress.   Neck: No JVD Cardiac: RRR, + murmur, no rubs, or gallops.  Respiratory: Clear to auscultation bilaterally. GI: Soft, nontender, non-distended  MS: No edema  Relevant CV Studies:  Limited echo 01/15/24 1. Left ventricular ejection fraction, by estimation, is 60 to 65%. Left  ventricular ejection fraction by 3D volume is 52 %. The left ventricle has  normal function. There is moderate left ventricular hypertrophy. Left  ventricular diastolic parameters  are indeterminate. The average left ventricular global longitudinal strain  is -20.0 %. The global longitudinal strain is normal.   2. Right ventricular systolic function is normal. The right ventricular  size is normal. Mildly increased right ventricular wall thickness. There  is severely elevated pulmonary artery systolic pressure. The estimated  right ventricular systolic pressure  is 69.5 mmHg.   3. Trivial mitral valve regurgitation. Moderate mitral stenosis;   transvalvular gradient likely accentuated by high-flow state. The mean  mitral valve gradient is 10.0 mmHg.   4. Tricuspid valve regurgitation is mild to moderate.   5. The aortic valve has an indeterminant number of cusps. There is  moderate calcification of the aortic valve. There is severe thickening of  the aortic valve. Aortic valve regurgitation is moderate. Severe aortic  valve stenosis. Aortic valve area, by  VTI measures 0.71 cm. Aortic valve mean gradient measures 47.0 mmHg.   6. The inferior vena cava is normal in size with <50% respiratory  variability, suggesting right atrial pressure of 8 mmHg.    R/L heart cath 08/15/23    Very Small Caliber Diagonal Branches: 1st Diag lesion is 90% stenosed. 2nd Diag lesion is 80% stenosed.   Mid LAD lesion is 55% stenosed.   LV end diastolic pressure is normal.   There is severe aortic valve stenosis.   Anticipated discharge date to be determined.   Plan per discussion with primary cardiology team and nephrologist would be to have the patient be admitted under the hospitalist service.  She will need to have dialysis done while she is here and have titration of medications.   Recommend Aspirin  81mg  daily for moderate CAD.   Dominance: Codominant  POST-OPERATIVE DIAGNOSIS:   Severe heavily calcified aortic stenosis with mean gradient by cardiac  catheterization of 40 to 50 mmHg (peak to peak gradient 59 mmHg) Single-vessel disease with a 60% LAD stenosis just after tandem very small caliber D1 and D2 (ostium of D1-D2 a roughly 90 and 80%); large codominant LCx with relatively no significant disease; moderate caliber codominant RCA with minimal disease in the PDA. RHC hemodynamics: RAP 1 mmHg, RV P-EDP 44/1-2 mmHg; PAP-mean 44/12-24 mmHg, PCWP mean 5 mmHg.;  Fick Cardiac Output-Index 6.41-3.87 with ao sat 93% and PA sat 70%. LV P-EDP 191/1-23 mmHg; AO P-MAP 140/61-95 mmHg Aortic AoV gradient P-P59 0.5 mmHg; mean AoV gradient estimated 45 to  50 mmHg     PT TO BE ADMITTED PER NEPHROLOGIST, DR LATEEF    Plan per discussion with primary cardiology team and nephrologist would be to have the patient be admitted under the hospitalist service.  She will need to have dialysis done while she is here and have titration of medications.   Recommend Aspirin  81mg  daily for moderate CAD.   Will need outpatient structural heart team   Echo 06/2023 1. Left ventricular ejection fraction, by estimation, is 60 to 65%. The  left ventricle has normal function. The left ventricle has no regional  wall motion abnormalities. There is moderate left ventricular hypertrophy.  Left ventricular diastolic  parameters are consistent with Grade I diastolic dysfunction (impaired  relaxation).   2. Right ventricular systolic function is normal. The right ventricular  size is normal. There is mildly elevated pulmonary artery systolic  pressure. The estimated right ventricular systolic pressure is 40.5 mmHg.   3. Left atrial size was mildly dilated.   4. The mitral valve is normal in structure. No evidence of mitral valve  regurgitation. No evidence of mitral stenosis. The mean mitral valve  gradient is 4.0 mmHg. Moderate mitral annular calcification.   5. The aortic valve is calcified. There is severe calcifcation of the  aortic valve. Aortic valve regurgitation is mild. Severe aortic valve  stenosis. Aortic valve area, by VTI measures 0.87 cm. Aortic valve mean  gradient measures 36.5 mmHg. Aortic  valve Vmax measures 4.75 m/s.   6. There is borderline dilatation of the ascending aorta, measuring 39  mm.   7. The inferior vena cava is normal in size with greater than 50%  respiratory variability, suggesting right atrial pressure of 3 mmHg.   Comparison(s): Previous AV meas, peak PG, 29 mmHg mean PG.    Echo 06/2022  1. Left ventricular ejection fraction, by estimation, is 60 to 65%. The  left ventricle has normal function. The left ventricle has  no regional  wall motion abnormalities. There is severe concentric left ventricular  hypertrophy. Left ventricular diastolic   parameters are consistent with Grade III diastolic dysfunction  (restrictive).   2. Right ventricular systolic function is normal. The right ventricular  size is normal.   3. Left atrial size was mildly dilated.   4. Right atrial size was mildly dilated.   5. The mitral valve is degenerative. Mild mitral valve regurgitation. No  evidence of mitral stenosis. Moderate to severe mitral annular  calcification.   6. The aortic valve is calcified. Aortic valve regurgitation is mild.  Moderate to severe aortic valve stenosis.   7. The inferior vena cava is normal in size with greater than 50%  respiratory variability, suggesting right atrial pressure of 3 mmHg.   Conclusion(s)/Recommendation(s): Valvular findings as outlined below    RHC 04/2020 Conclusions: Mildly elevated left heart, right heart, and pulmonary artery pressures. Normal to supranormal  Fick cardiac output/index. Recommendations: Continue gentle diuresis. Consider further workup of potential causes of high-output heart failure.   Lonni Hanson, MD [facility]  Assessment & Plan   Syncope Severe AS - Patient presented with syncope found to have acute hypoxic respiratory failure, sepsis due to pneumonia, left lower lung collapse, acute lower UTI  - Patient has history of severe aortic stenosis and outpatient plan was for TAVR, but she missed TAVR procedure date d/t lack of transportation - Patient does not remember symptoms prior to her fall.  She was out for only a few seconds before coming to.  She was on the ground for 30 minutes before being found - orthostatics negative - Bps have actually been high - Repeat limited echo showed LVEF 60-65%, mod MS, mild to md TR, severe AS - suspect syncope with the above contributing factors in the setting of severe AS - structural team has been  notified and it's felt hospice care may be the best option for the patient. Will defer to structural valve team.   Sepsis secondary to pneumonia Left lower lung collapse CAP Acute lower UTI Acute hypoxic respiratory failure due to pneumonia - Status post IV fluids - IV antibiotics per IM - Blood cultures ordered - Pulmonology following - IV Solu-Medrol  and DuoNeb   CAD - No chest pain reported - HS trop minimally elevated 31>23, not consistent with ACS - Continue aspirin    ESRD on HD - Nephrology following    For questions or updates, please contact [facility] Please consult [email or web address] for contact info under       Signed, Jameisha Stofko VEAR Fishman, PA-C

## 2024-01-15 NOTE — Progress Notes (Signed)
   01/15/24 1315  Vitals  Temp 97.7 F (36.5 C)  Temp Source Oral  BP (!) 166/65  MAP (mmHg) 95  BP Location Right Arm  BP Method Automatic  Patient Position (if appropriate) Lying  Pulse Rate 92  Pulse Rate Source Monitor  ECG Heart Rate 94  Resp (!) 25  Oxygen Therapy  SpO2 99 %  O2 Device Room Air  During Treatment Monitoring  Blood Flow Rate (mL/min) 0 mL/min  Arterial Pressure (mmHg) 31.31 mmHg  Venous Pressure (mmHg) -27.27 mmHg  TMP (mmHg) 19.59 mmHg  Ultrafiltration Rate (mL/min) 779 mL/min  Dialysate Flow Rate (mL/min) 300 ml/min  Duration of HD Treatment -hour(s) 3.5 hour(s)  Cumulative Fluid Removed (mL) per Treatment  2000.15  Post Treatment  Dialyzer Clearance Lightly streaked  Hemodialysis Intake (mL)  (0)  Liters Processed 82.8  Fluid Removed (mL) 2000 mL  Tolerated HD Treatment Yes  Post-Hemodialysis Comments tolerated well tx completed  Fistula / Graft Left Upper arm  No placement date or time found.   Placed prior to admission: Yes  Orientation: Left  Access Location: Upper arm  Site Condition No complications  Fistula / Graft Assessment Present;Thrill;Bruit  Status Deaccessed  Drainage Description None     01/15/24 1315  Vitals  Temp 97.7 F (36.5 C)  Temp Source Oral  BP (!) 166/65  MAP (mmHg) 95  BP Location Right Arm  BP Method Automatic  Patient Position (if appropriate) Lying  Pulse Rate 92  Pulse Rate Source Monitor  ECG Heart Rate 94  Resp (!) 25  Oxygen Therapy  SpO2 99 %  O2 Device Room Air  During Treatment Monitoring  Blood Flow Rate (mL/min) 0 mL/min  Arterial Pressure (mmHg) 31.31 mmHg  Venous Pressure (mmHg) -27.27 mmHg  TMP (mmHg) 19.59 mmHg  Ultrafiltration Rate (mL/min) 779 mL/min  Dialysate Flow Rate (mL/min) 300 ml/min  Duration of HD Treatment -hour(s) 3.5 hour(s)  Cumulative Fluid Removed (mL) per Treatment  2000.15  Post Treatment  Dialyzer Clearance Lightly streaked  Hemodialysis Intake (mL)  (0)  Liters  Processed 82.8  Fluid Removed (mL) 2000 mL  Tolerated HD Treatment Yes  Post-Hemodialysis Comments tolerated well tx completed  Fistula / Graft Left Upper arm  No placement date or time found.   Placed prior to admission: Yes  Orientation: Left  Access Location: Upper arm  Site Condition No complications  Fistula / Graft Assessment Present;Thrill;Bruit  Status Deaccessed  Drainage Description None   Received patient in bed to unit.  Alert and oriented.  Informed consent signed and in chart.   TX duration:3.5 hr  Patient tolerated well.  Transported back to the room  Alert, without acute distress.  Hand-off given to patient's nurse.   Access used: LUA fistula Access issues: none  Total UF removed: 2000 Medication(s) given: Retacrit  4000 units Post HD VS: see above Post HD weight: n/a   Docia CHRISTELLA [facility]

## 2024-01-16 ENCOUNTER — Telehealth: Payer: Self-pay | Admitting: Medical

## 2024-01-16 DIAGNOSIS — I10 Essential (primary) hypertension: Secondary | ICD-10-CM

## 2024-01-16 DIAGNOSIS — N186 End stage renal disease: Secondary | ICD-10-CM | POA: Diagnosis not present

## 2024-01-16 DIAGNOSIS — I350 Nonrheumatic aortic (valve) stenosis: Secondary | ICD-10-CM | POA: Diagnosis not present

## 2024-01-16 DIAGNOSIS — J189 Pneumonia, unspecified organism: Secondary | ICD-10-CM | POA: Diagnosis not present

## 2024-01-16 DIAGNOSIS — A419 Sepsis, unspecified organism: Secondary | ICD-10-CM | POA: Diagnosis not present

## 2024-01-16 LAB — CBC
HCT: 31.1 % — ABNORMAL LOW (ref 36.0–46.0)
Hemoglobin: 9.8 g/dL — ABNORMAL LOW (ref 12.0–15.0)
MCH: 31.5 pg (ref 26.0–34.0)
MCHC: 31.5 g/dL (ref 30.0–36.0)
MCV: 100 fL (ref 80.0–100.0)
Platelets: 145 K/uL — ABNORMAL LOW (ref 150–400)
RBC: 3.11 MIL/uL — ABNORMAL LOW (ref 3.87–5.11)
RDW: 12.3 % (ref 11.5–15.5)
WBC: 5.1 K/uL (ref 4.0–10.5)
nRBC: 0 % (ref 0.0–0.2)

## 2024-01-16 LAB — GLUCOSE, CAPILLARY
Glucose-Capillary: 98 mg/dL (ref 70–99)
Glucose-Capillary: 157 mg/dL — ABNORMAL HIGH (ref 70–99)

## 2024-01-16 LAB — URINE CULTURE: Culture: >=100000 — AB

## 2024-01-16 LAB — MAGNESIUM: Magnesium: 2.1 mg/dL (ref 1.7–2.4)

## 2024-01-16 LAB — BASIC METABOLIC PANEL WITH GFR
Anion gap: 10 (ref 5–15)
BUN: 35 mg/dL — ABNORMAL HIGH (ref 8–23)
CO2: 30 mmol/L (ref 22–32)
Calcium: 9.1 mg/dL (ref 8.9–10.3)
Chloride: 98 mmol/L (ref 98–111)
Creatinine, Ser: 2.86 mg/dL — ABNORMAL HIGH (ref 0.44–1.00)
GFR, Estimated: 18 mL/min — ABNORMAL LOW (ref >60)
Glucose, Bld: 83 mg/dL (ref 70–99)
Potassium: 4.3 mmol/L (ref 3.5–5.1)
Sodium: 138 mmol/L (ref 135–145)

## 2024-01-16 LAB — LEGIONELLA PNEUMOPHILA SEROGP 1 UR AG: L. pneumophila Serogp 1 Ur Ag: NEGATIVE

## 2024-01-16 LAB — PHOSPHORUS: Phosphorus: 3.5 mg/dL (ref 2.5–4.6)

## 2024-01-16 MED ORDER — GUAIFENESIN ER 600 MG PO TB12: 600.0000 mg | ORAL_TABLET | Freq: Two times a day (BID) | ORAL | Status: AC

## 2024-01-16 MED ORDER — AZITHROMYCIN 500 MG PO TABS: 500.0000 mg | ORAL_TABLET | Freq: Every day | ORAL | Status: AC

## 2024-01-16 MED ORDER — BISACODYL 10 MG RE SUPP
10.0000 mg | Freq: Once | RECTAL | Status: AC
  Administered 2024-01-16: 10 mg via RECTAL
  Filled 2024-01-16: qty 1

## 2024-01-16 MED ORDER — GUAIFENESIN-DM 100-10 MG/5ML PO SYRP: 10.0000 mL | ORAL_SOLUTION | Freq: Four times a day (QID) | ORAL | Status: DC | PRN

## 2024-01-16 MED ORDER — POLYETHYLENE GLYCOL 3350 17 G PO PACK: 17.0000 g | PACK | Freq: Two times a day (BID) | ORAL | Status: DC

## 2024-01-16 MED ORDER — OXYCODONE HCL 5 MG PO CAPS: 5.0000 mg | ORAL_CAPSULE | Freq: Four times a day (QID) | ORAL | 0 refills | Status: DC

## 2024-01-16 MED ORDER — METOPROLOL TARTRATE 25 MG PO TABS
25.0000 mg | ORAL_TABLET | Freq: Two times a day (BID) | ORAL | Status: DC | PRN
  Filled 2024-01-16: qty 1

## 2024-01-16 MED ORDER — PREDNISONE 20 MG PO TABS: 20.0000 mg | ORAL_TABLET | Freq: Every day | ORAL | Status: AC

## 2024-01-16 MED ORDER — IPRATROPIUM-ALBUTEROL 0.5-2.5 (3) MG/3ML IN SOLN: 3.0000 mL | Freq: Two times a day (BID) | RESPIRATORY_TRACT | Status: DC

## 2024-01-16 MED ORDER — METOPROLOL TARTRATE 25 MG PO TABS: 25.0000 mg | ORAL_TABLET | Freq: Two times a day (BID) | ORAL | Status: DC | PRN

## 2024-01-16 MED ORDER — NEPRO/CARBSTEADY PO LIQD
237.0000 mL | Freq: Two times a day (BID) | ORAL | Status: DC
  Administered 2024-01-16 (×2): 237 mL via ORAL

## 2024-01-16 MED ORDER — CEFDINIR 300 MG PO CAPS: 300.0000 mg | ORAL_CAPSULE | ORAL | Status: AC

## 2024-01-16 MED ORDER — POLYETHYLENE GLYCOL 3350 17 G PO PACK
17.0000 g | PACK | Freq: Two times a day (BID) | ORAL | Status: DC
  Administered 2024-01-16: 17 g via ORAL
  Filled 2024-01-16: qty 1

## 2024-01-16 MED ORDER — BISACODYL 5 MG PO TBEC: 10.0000 mg | DELAYED_RELEASE_TABLET | Freq: Every day | ORAL | Status: DC

## 2024-01-16 MED ORDER — CALCITRIOL 0.25 MCG PO CAPS: 0.2500 ug | ORAL_CAPSULE | Freq: Every day | ORAL | Status: DC

## 2024-01-16 NOTE — Telephone Encounter (Signed)
-----   Message from Nurse Kaushal P sent at 01/16/2024 11:39 AM EDT ----- Regarding: FW: re-schedule  ----- Message ----- From: Franchester Mikey DEL, PA-C Sent: 01/16/2024  11:37 AM EDT To: [facility] Triage Subject: re-schedule                                    Patient needs to be re-scheduled to December 2025 with me please. Cancel sept 9th apt

## 2024-01-16 NOTE — Discharge Instructions (Signed)

## 2024-01-16 NOTE — TOC Initial Note (Addendum)
 Transition of Care [facility]) - Initial/Assessment Note    Patient Details  Name: Katie [name] MRN: [id number] Date of Birth: 1956-02-20  Transition of [facility] CM/SW Contact:    Lauraine JAYSON Carpen, LCSW Phone Number: 01/16/2024, 10:28 AM  Clinical Narrative:  CSW met with patient. No family at bedside. CSW introduced role and explained that discharge planning would be discussed. Patient confirmed she is a resident at [facility]. She uses a RW and was not getting therapy prior to admission. She does not have any local family that could transport. Will see if ALF can transport her at discharge or if she'll need ambulance. No further concerns. CSW will continue to follow patient for support and facilitate return to ALF once medically stable.                11:09 am: [facility] is aware patient will discharge today pending BM. They can pick her up but their transport person leaves at 4:00.  Expected Discharge Plan: Assisted Living Barriers to Discharge: Continued Medical Work up   Patient Goals and CMS Choice            Expected Discharge Plan and Services     Post Acute Care Choice: NA Living arrangements for the past 2 months: Assisted Living Facility                                      Prior Living Arrangements/Services Living arrangements for the past 2 months: Assisted Living Facility Lives with:: Facility Resident Patient language and need for interpreter reviewed:: Yes Do you feel safe going back to the place where you live?: Yes      Need for Family Participation in Patient Care: Yes (Comment) Care giver support system in place?: Yes (comment) Current home services: DME Criminal Activity/Legal Involvement Pertinent to Current Situation/Hospitalization: No - Comment as needed  Activities of Daily Living   ADL Screening (condition at time of admission) Independently performs ADLs?: Yes (appropriate for developmental age)  Permission  Sought/Granted Permission sought to share information with : Facility Industrial/product designer granted to share information with : Yes, Verbal Permission Granted     Permission granted to share info w AGENCY: [facility]        Emotional Assessment Appearance:: Appears stated age Attitude/Demeanor/Rapport: Engaged, Gracious Affect (typically observed): Accepting, Appropriate, Calm, Pleasant Orientation: : Oriented to Self, Oriented to Place, Oriented to  Time, Oriented to Situation Alcohol  / Substance Use: Not Applicable Psych Involvement: No (comment)  Admission diagnosis:  Community acquired pneumonia [J18.9] Acute UTI [N39.0] Aortic stenosis, severe [I35.0] Injury of head, initial encounter [S09.90XA] Syncope, unspecified syncope type [R55] Chest pain, unspecified type [R07.9] Patient Active Problem List   Diagnosis Date Noted   Syncope 01/15/2024   Acute UTI 01/15/2024   Chest pain 01/15/2024   Head injury 01/15/2024   Sepsis due to pneumonia (HCC) 01/14/2024   GERD without esophagitis 01/14/2024   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   End-stage renal disease on hemodialysis (HCC) 01/14/2024   Chronic pain syndrome 08/26/2023   Pharmacologic therapy 08/26/2023   Disorder of skeletal system 08/26/2023   Problems influencing health status 08/26/2023   CHF (congestive heart failure) (HCC) 08/15/2023   Orthostatic hypotension 06/12/2022   Vomiting and diarrhea 06/12/2022   Cellulitis of left upper extremity 12/11/2021   Type II diabetes mellitus with renal manifestations (HCC)  12/11/2021   Anemia in ESRD (end-stage renal disease) (HCC) 12/11/2021   Elevated lactic acid level 12/11/2021   Chronic diastolic CHF (congestive heart failure) (HCC) 12/11/2021   Left arm cellulitis 11/09/2021   Adjustment disorder with mixed anxiety and depressed mood 11/07/2021   Aortic stenosis, severe 11/01/2021   Fecal impaction (HCC)  10/27/2021   Bent bone fracture of ulna 10/24/2021   Hospital acquired PNA 10/24/2021   Sepsis (HCC) 10/23/2021   Cellulitis 10/23/2021   Right flank pain 09/23/2021   Iron deficiency anemia, unspecified 03/27/2021   Allergy, unspecified, initial encounter 03/22/2021   Anaphylactic shock, unspecified, initial encounter 03/22/2021   Unspecified jaundice 03/15/2021   Allergy status to penicillin 03/14/2021   Anemia in chronic kidney disease 03/14/2021   Chronic diastolic (congestive) heart failure (HCC) 03/14/2021   Dependence on renal dialysis (HCC) 03/14/2021   Depression, unspecified 03/14/2021   Hypertensive heart and chronic kidney disease with heart failure and with stage 5 chronic kidney disease, or end stage renal disease (HCC) 03/14/2021   Personal history of nicotine  dependence 03/14/2021   Secondary hyperparathyroidism of renal origin (HCC) 03/14/2021   Frequent falls 03/07/2021   Financial difficulty 03/07/2021   ESRD (end stage renal disease) on dialysis (HCC) 12/23/2020   Persistent headaches 06/27/2020   ATN (acute tubular necrosis) (HCC)    Pulmonary hypertension, unspecified (HCC)    Chronic kidney disease (CKD), stage IV (severe) (HCC)    Pleural effusion    Acute heart failure with preserved ejection fraction (HFpEF) (HCC)    Pressure injury of skin 05/01/2020   Hyperkalemia    Volume overload 04/30/2020   Hypertension    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HCC)    Type 2 diabetes mellitus (HCC)    PCP:  Cordella Corning, FNP Pharmacy:   [facility] [address], [place] - [address] [place] [address] Phone: [phone] Fax: [phone]  [facility] [address]), [place] - [address] [place]) [place] [address] Phone: [phone] Fax: [phone]  [facility] - [place], [place] - [phone] [place] [address] Phone:  [phone] Fax: [phone]  [facility] - [place], [place] - [address] [place] [address] Phone: [phone] Fax: [phone]     Social Drivers of Health (SDOH) Social History: SDOH Screenings   Food Insecurity: No Food Insecurity (01/14/2024)  Housing: Low Risk  (01/14/2024)  Transportation Needs: No Transportation Needs (01/14/2024)  Utilities: Not At Risk (01/14/2024)  Social Connections: Socially Isolated (01/14/2024)  Tobacco Use: Medium Risk (01/06/2024)   SDOH Interventions:     Readmission Risk Interventions    01/16/2024   10:27 AM 06/13/2022    4:02 PM 09/27/2021    1:44 PM  Readmission Risk Prevention Plan  Transportation Screening  Complete   HRI or Home Care Consult   --  Palliative Care Screening   Not Applicable  Medication Review (RN Care Manager)  Complete   PCP or Specialist appointment within 3-5 days of discharge Complete    HRI or Home Care Consult  Complete   SW Recovery Care/Counseling Consult Complete    Palliative Care Screening Not Applicable Not Applicable   Skilled Nursing Facility Not Applicable Not Applicable

## 2024-01-16 NOTE — Discharge Summary (Addendum)
 [facility] Discharge Summary   Patient: Katie [name] FMW:[id number]  PCP: Cordella Corning, FNP  Date of admission: 01/13/2024   Date of discharge:  01/16/2024     Discharge Diagnoses:  Principal Problem:   Sepsis due to pneumonia [facility]) Active Problems:   Type 2 diabetes mellitus with renal manifestations (HCC)   End-stage renal disease on hemodialysis (HCC)   Aortic stenosis, severe   GERD without esophagitis   Syncope   Acute UTI   Chest pain   Head injury   Admitted From: ALF Disposition: ALF   Recommendations for Outpatient Follow-up:  F/u with PCP in 1-2 days F/u Nephro for HD as per schedule  F/u Pulm in 1-2 weeks, repeat CXR or CT chest in few weeks F/u with Cardio in 1-2 weeks for severe AS She has appointment in 3 weeks at 9:20 in the morning in [place] to see [facility] about TAVR  Follow up LABS/TEST:  as above   Follow-up Information     Cordella Corning, FNP Follow up in 1 week(s).   Specialty: Family Medicine Contact information: [address] [place] [address] [phone]         Perla Evalene PARAS, MD Follow up in 1 week(s).   Specialty: Cardiology Contact information: [address] [place] [address] [phone]         Isaiah Scrivener, MD Follow up in 2 week(s).   Specialties: Pulmonary Disease, Cardiology Contact information: [address] [place] [address] [phone]                Diet recommendation: Carb modified diet  Activity: The patient is advised to gradually reintroduce usual activities, as tolerated  Discharge Condition: stable  Code Status: DNR-intervene  History of present illness: As per the H and P dictated on admission.  Hospital Course:   Katie [name] is a 68 y.o. Caucasian female with medical history significant for COPD, type II reasonableness, left eye blindness, end-stage renal disease on hemodialysis, essential hypertension, severe aortic  stenosis and chronic diastolic CHF, who presented to the emergency room with acute onset of syncope and subsequent fall.  The patient has been having chest pain and dyspnea with associated provide cough with wheeze over the last few days.  She had nausea yesterday without vomiting or diaphoresis.  No head injuries.  She believes she had tactile fever and chills.  She admits to urinary frequency and urgency without dysuria or hematuria or flank pain.  No bleeding diathesis.  She did not miss any hemodialysis sessions.   ED Course: When the patient came to the ER, BP was 175/70 with otherwise normal vital signs.  Labs revealed potassium of 5.3 and BUN of 86 with creatinine 4.77, albumin  3.2 and ALT 58 with otherwise unremarkable CMP.  BNP was 1301.5 and high-sensitivity troponin I was 31.  Lactic acid was 0.7.  CBC showed anemia with macrocytosis and mild thrombocytopenia of 139.  TSH was 1.28.  UA was positive for UTI.  Urine culture was sent.  Blood cultures were ordered. EKG as reviewed by me : EKG showed normal sinus rhythm with rate of 98 with probable left atrial enlargement and peaked T waves anteroseptally with LVH and Q waves inferiorly. Imaging: Noncontrast head CT and C-spine CT scan revealed the following: 1. No acute intracranial abnormality. No calvarial fracture. 2. No acute fracture or listhesis of the cervical spine. 3. Degenerative disc and degenerative joint disease resulting in moderate central  canal stenosis at C5-6. 4. Advanced vascular calcifications within the carotid bifurcations.   Chest CTA revealed the following: 1. No pulmonary embolism. 2. Extensive airway impaction with complete collapse of the left lower lobe. 3. 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 4. Extensive multi-vessel coronary artery calcification. 5. Advanced aortic and mitral valvular calcification. Echocardiography may be  helpful to assess the degree of valvular dysfunction 6. Stable fusiform dilation of the ascending aorta measuring 4.1 cm in maximal diameter. Recommend annual imaging followup by CTA or MRA. This recommendation follows 2010 ACCF/AHA/AATS/ACR/ASA/SCA/SCAI/SIR/STS/SVM Guidelines for the Diagnosis and Management of Patients with Thoracic Aortic Disease. Circulation. 2010; 121: Z733-z630. Aortic aneurysm NOS (ICD10-I71.9) 7. Cholelithiasis. 8. Aortic atherosclerosis.   The patient was given IV Rocephin  and Zithromax , 50 mcg of IV fentanyl  and 4 mg of IV Zofran  as well as DuoNeb.  She will be admitted to a progressive unit bed for further evaluation and management.     Assessment and Plan:   # Sepsis due to pneumonia  # Community-acquired pneumonia, LLL with subsequent collapse as well as acute lower UTI. # Acute hypoxic respiratory failure due to pneumonia.  Resolved currently saturating well on room air. S/p IVF, d/c'd vitals stable, no need of IVF  S/p IV Rocephin  and Zithromax .  Transition to oral antibiotics azithromycin  500 p.o. daily for 3 days and Omnicef  300 mg every other day for 4 more doses.  Continue Mucinex  600 mg p.o. twice daily and Robitussin DM as needed for cough. Blood cultures NGTD. Pulmonologist consulted, recommended no need of bronc, continue antibiotics and steroids. S/p Solu-Medrol  10 mg IV twice daily.  S/p DuoNeb every 6 hourly, Pulmicort  twice daily.  Continue albuterol  as needed.  Start prednisone  20 mg p.o. daily for 5 to 7 days.  Follow with pulmonary and repeat CXR or CT chest in 4 weeks.   UTI due to E. coli, Ucx growing E. Coli S/p ceftriaxone .  Continue Omnicef  as above.   # syncope and fall # Severe aortic valve stenosis. repeat TTE showed LVEF 60-65%, mod MS, mild to md TR, severe AS  Cardiology consulted for further recommendation As per cards, outpatient plan was for TAVR, but she missed TAVR procedure date d/t lack of transportation 9/4 d/w  cardiology and they discussed her case with structural team in [place]. She has appointment in 3 weeks at 9:20 in the morning in [place] to see [facility] about TAVR     # Type 2 diabetes mellitus with renal manifestations S/p NovoLog  sliding.  HbA1c 4.5, in nondiabetic range. Currently patient is not on any medication.  Continue diabetic diet.  # End-stage renal disease on hemodialysis Nephrology consulted, hemodialysis as per schedule   # GERD without esophagitis - continue H2 blocker therapy and PPI therapy.   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  Started Calcitrol 0.25 mcg p.o. daily. Repeat vitamin D  level after 3 to 6 months   # Abdominal aortic aneurysm 4.1 cm repeat Patient will need annual monitoring. Follow with PCP as an outpatient. # CAD, continue aspirin  as per cardio # Constipation: Continue laxatives.  Patient received Dulcolax suppository before discharge.   Body mass index is 19.25 kg/m.  Nutrition Interventions:  Patient was seen by physical therapy, who recommended Therapy, SNF placement, which was arranged. On the day of the discharge the patient's vitals were stable, and no other acute medical condition were reported by patient. the patient was felt safe to  be discharge at SNF with Therapy.  Consultants: Neurology, nephrology and pulmonary. Procedures: Hemodialysis  Discharge Exam: General: Appear in no distress, Oral Mucosa Clear, moist. Cardiovascular: S1 and S2 Present, no Murmur, Respiratory: normal respiratory effort, Bilateral Air entry present and no Crackles, no wheezes Abdomen: Bowel Sound present, Soft and no tenderness. Extremities: no Pedal edema, no calf tenderness Neurology: alert and oriented to time, place, and person affect appropriate.  Filed Weights   01/14/24 1230 01/15/24 0920 01/16/24 0400  Weight: 54.8 kg 54.8 kg 54.1 kg   Vitals:   01/16/24 1003 01/16/24 1130  BP: (!) 140/65 (!) 144/75  Pulse: 91 88  Resp:    Temp:  98.4  F (36.9 C)  SpO2:  99%    DISCHARGE MEDICATION: Allergies as of 01/16/2024       Reactions   Penicillins Nausea And Vomiting   Pt states only pills make her vomit Other reaction(s): Nausea/Vomit        Medication List     STOP taking these medications    guaifenesin  100 MG/5ML syrup Commonly known as: ROBITUSSIN Replaced by: guaiFENesin  600 MG 12 hr tablet   loperamide  2 MG capsule Commonly known as: IMODIUM    Polyethylene Glycol 3350  Powd   REGULOID PO   saccharomyces boulardii 250 MG capsule Commonly known as: FLORASTOR   senna 8.6 MG Tabs tablet Commonly known as: SENOKOT   senna-docusate 8.6-50 MG tablet Commonly known as: Senokot-S       TAKE these medications    acetaminophen  500 MG tablet Commonly known as: TYLENOL  Take 500 mg by mouth 3 (three) times daily. Take additional 500 mg every 6 hours as needed for pain   Advair Diskus 250-50 MCG/ACT Aepb Generic drug: fluticasone -salmeterol Inhale 1 puff into the lungs 2 (two) times daily.   albuterol  108 (90 Base) MCG/ACT inhaler Commonly known as: VENTOLIN  HFA Inhale 1 puff into the lungs every 6 (six) hours as needed for wheezing.   aspirin  81 MG chewable tablet Chew 81 mg by mouth daily.   azithromycin  500 MG tablet Commonly known as: Zithromax  Take 1 tablet (500 mg total) by mouth daily for 3 days.   bisacodyl  5 MG EC tablet Commonly known as: DULCOLAX Take 2 tablets (10 mg total) by mouth at bedtime.   calcitRIOL  0.25 MCG capsule Commonly known as: ROCALTROL  Take 1 capsule (0.25 mcg total) by mouth daily. Start taking on: January 17, 2024   calcium  carbonate 500 MG chewable tablet Commonly known as: TUMS - dosed in mg elemental calcium  Chew 1 tablet by mouth 3 (three) times daily as needed (GRED).   cefdinir  300 MG capsule Commonly known as: OMNICEF  Take 1 capsule (300 mg total) by mouth every other day for 4 doses.   DULoxetine  30 MG capsule Commonly known as: CYMBALTA  Take  30 mg by mouth daily.   famotidine  20 MG tablet Commonly known as: PEPCID  Take 10 mg by mouth at bedtime.   guaiFENesin  600 MG 12 hr tablet Commonly known as: MUCINEX  Take 1 tablet (600 mg total) by mouth 2 (two) times daily for 7 days. Replaces: guaifenesin  100 MG/5ML syrup   guaiFENesin -dextromethorphan  100-10 MG/5ML syrup Commonly known as: ROBITUSSIN DM Take 10 mLs by mouth every 6 (six) hours as needed for cough.   melatonin 3 MG Tabs tablet Take 3 mg by mouth at bedtime.   metoprolol  tartrate 25 MG tablet Commonly known as: LOPRESSOR  Take 1 tablet (25 mg total) by mouth 2 (two) times daily as needed (SBP >  150).   midodrine  5 MG tablet Commonly known as: PROAMATINE  Take 5 mg by mouth. Monday, Wednesday, Friday prior to dialysis   ondansetron  4 MG disintegrating tablet Commonly known as: ZOFRAN -ODT Take 1 tablet (4 mg total) by mouth every 8 (eight) hours as needed for nausea or vomiting.   ondansetron  4 MG tablet Commonly known as: ZOFRAN  Take 4 mg by mouth every Monday, Wednesday, and Friday.   oxycodone  5 MG capsule Commonly known as: OXY-IR Take 1 capsule (5 mg total) by mouth every 6 (six) hours.   pantoprazole  40 MG tablet Commonly known as: PROTONIX  Take 40 mg by mouth daily.   polyethylene glycol 17 g packet Commonly known as: MIRALAX  / GLYCOLAX  Take 17 g by mouth 2 (two) times daily.   predniSONE  20 MG tablet Commonly known as: DELTASONE  Take 1 tablet (20 mg total) by mouth daily with breakfast for 7 days.   Sarna Sensitive 1 % Lotn Generic drug: pramoxine Apply 1 Application topically 2 (two) times daily.   topiramate  50 MG tablet Commonly known as: TOPAMAX  Take 1 tablet (50 mg total) by mouth 2 (two) times daily as needed (headache).   traZODone  50 MG tablet Commonly known as: DESYREL  Take 75 mg by mouth at bedtime.       Allergies  Allergen Reactions   Penicillins Nausea And Vomiting    Pt states only pills make her  vomit Other reaction(s): Nausea/Vomit   Discharge Instructions     Call MD for:  difficulty breathing, headache or visual disturbances   Complete by: As directed    Call MD for:  extreme fatigue   Complete by: As directed    Call MD for:  persistant dizziness or light-headedness   Complete by: As directed    Call MD for:  persistant nausea and vomiting   Complete by: As directed    Call MD for:  severe uncontrolled pain   Complete by: As directed    Call MD for:  temperature >100.4   Complete by: As directed    Diet - low sodium heart healthy   Complete by: As directed    Discharge instructions   Complete by: As directed    F/u with PCP in 1-2 days F/u Nephro for HD as per schedule  F/u Pulm in 1-2 weeks, repeat CXR or CT chest in few weeks F/u with Cardio in 1-2 weeks for severe AS   Increase activity slowly   Complete by: As directed    No wound care   Complete by: As directed        The results of significant diagnostics from this hospitalization (including imaging, microbiology, ancillary and laboratory) are listed below for reference.    Significant Diagnostic Studies: ECHOCARDIOGRAM LIMITED Result Date: 01/15/2024    ECHOCARDIOGRAM LIMITED REPORT   Patient Name:   [facility] Date of Exam: 01/14/2024 Medical Rec #:  [id number]    Height:       66.0 in Accession #:    [id number]   Weight:       120.8 lb Date of Birth:  1955-11-02   BSA:          1.614 m Patient Age:    67 years     BP:           160/62 mmHg Patient Gender: F            HR:           92 bpm. Exam Location:  [facility] Procedure: 2D Echo, Limited Echo, 3D Echo, Limited Color Doppler, Cardiac            Doppler and Strain Analysis (Both Spectral and Color Flow Doppler            were utilized during procedure). Indications:     I35.0 Aortic Stenosis  History:         Patient has prior history of Echocardiogram examinations, most                  recent 06/20/2023. COPD; Risk Factors:Diabetes, Former Smoker and                   Hypertension. End Stage Renal Disease-Hemodialysis.  Sonographer:     Carl Coma RDCS Referring Phys:  [id number] ELVAN SOR Diagnosing Phys: Lonni Hanson MD IMPRESSIONS  1. Left ventricular ejection fraction, by estimation, is 60 to 65%. Left ventricular ejection fraction by 3D volume is 52 %. The left ventricle has normal function. There is moderate left ventricular hypertrophy. Left ventricular diastolic parameters are indeterminate. The average left ventricular global longitudinal strain is -20.0 %. The global longitudinal strain is normal.  2. Right ventricular systolic function is normal. The right ventricular size is normal. Mildly increased right ventricular wall thickness. There is severely elevated pulmonary artery systolic pressure. The estimated right ventricular systolic pressure is 69.5 mmHg.  3. Trivial mitral valve regurgitation. Moderate mitral stenosis; transvalvular gradient likely accentuated by high-flow state. The mean mitral valve gradient is 10.0 mmHg.  4. Tricuspid valve regurgitation is mild to moderate.  5. The aortic valve has an indeterminant number of cusps. There is moderate calcification of the aortic valve. There is severe thickening of the aortic valve. Aortic valve regurgitation is moderate. Severe aortic valve stenosis. Aortic valve area, by VTI measures 0.71 cm. Aortic valve mean gradient measures 47.0 mmHg.  6. The inferior vena cava is normal in size with <50% respiratory variability, suggesting right atrial pressure of 8 mmHg. FINDINGS  Left Ventricle: Left ventricular ejection fraction, by estimation, is 60 to 65%. Left ventricular ejection fraction by 3D volume is 52 %. The left ventricle has normal function. The average left ventricular global longitudinal strain is -20.0 %. Strain was performed and the global longitudinal strain is normal. The left ventricular internal cavity size was normal in size. There is moderate left ventricular hypertrophy. Left  ventricular diastolic parameters are indeterminate. Right Ventricle: The right ventricular size is normal. Mildly increased right ventricular wall thickness. Right ventricular systolic function is normal. There is severely elevated pulmonary artery systolic pressure. The tricuspid regurgitant velocity is 3.92 m/s, and with an assumed right atrial pressure of 8 mmHg, the estimated right ventricular systolic pressure is 69.5 mmHg. Pericardium: Trivial pericardial effusion is present. There is moderate thickening of the mitral valve leaflet(s). Trivial mitral valve regurgitation. Moderate mitral valve stenosis. Tricuspid Valve: The tricuspid valve is normal in structure. Tricuspid valve regurgitation is mild to moderate. The aortic valve has an indeterminant number of cusps. There is moderate calcification of the aortic valve. There is severe thickening of the aortic valve. Aortic valve regurgitation is moderate. Severe aortic stenosis is present. Aorta: The aortic root is normal in size and structure. Venous: The inferior vena cava is normal in size with less than 50% respiratory variability, suggesting right atrial pressure of 8 mmHg. Additional Comments: 3D was performed not requiring image post processing on an independent workstation and was normal.  LEFT VENTRICLE PLAX 2D LVIDd:  3.90 cm         Diastology LVIDs:         2.80 cm         LV e' medial:    5.22 cm/s LV PW:         1.50 cm         LV E/e' medial:  26.1 LV IVS:        1.40 cm         LV e' lateral:   5.44 cm/s LVOT diam:     1.80 cm         LV E/e' lateral: 25.1 LV SV:         60 LV SV Index:   37              2D Longitudinal LVOT Area:     2.54 cm        Strain                                2D Strain GLS   -19.1 %                                (A4C):                                2D Strain GLS   -20.7 %                                (A3C):                                2D Strain GLS   -20.2 %                                (A2C):                                 2D Strain GLS   -20.0 %                                Avg:                                 3D Volume EF                                LV 3D EF:    Left                                             ventricul  ar                                             ejection                                             fraction                                             by 3D                                             volume is                                             52 %.                                 3D Volume EF:                                3D EF:        52 %                                LV EDV:       149 ml                                LV ESV:       72 ml                                LV SV:        78 ml RIGHT VENTRICLE             IVC RV S prime:     14.00 cm/s  IVC diam: 1.10 cm TAPSE (M-mode): 2.8 cm LEFT ATRIUM         Index LA diam:    4.60 cm 2.85 cm/m  AORTIC VALVE AV Area (Vmax):    0.76 cm AV Area (Vmean):   0.67 cm AV Area (VTI):     0.71 cm AV Vmax:           406.60 cm/s AV Vmean:          314.200 cm/s AV VTI:            0.851 m AV Peak Grad:      66.1 mmHg AV Mean Grad:      47.0 mmHg LVOT Vmax:         121.00 cm/s LVOT Vmean:        83.267 cm/s LVOT VTI:  0.237 m LVOT/AV VTI ratio: 0.28 AI PHT:            322 msec  AORTA Ao Root diam: 3.20 cm MITRAL VALVE                TRICUSPID VALVE MV Area (PHT): 4.10 cm     TR Peak grad:   61.5 mmHg MV Area VTI:   1.46 cm     TR Vmax:        392.00 cm/s MV Peak grad:  19.9 mmHg MV Mean grad:  10.0 mmHg    SHUNTS MV Vmax:       2.23 m/s     Systemic VTI:  0.24 m MV Vmean:      148.0 cm/s   Systemic Diam: 1.80 cm MV Decel Time: 185 msec MV E velocity: 136.50 cm/s MV A velocity: 203.00 cm/s MV E/A ratio:  0.67 Lonni End MD Electronically signed by Lonni Hanson MD Signature Date/Time: 01/15/2024/7:12:29 AM    Final    CT HEAD WO CONTRAST ( ) Result Date:  01/14/2024 CLINICAL DATA:  Neck trauma (Age >= 65y); Head trauma, minor (Age >= 65y). Dizziness, syncope, fall EXAM: CT HEAD WITHOUT CONTRAST CT CERVICAL SPINE WITHOUT CONTRAST TECHNIQUE: Multidetector CT imaging of the head and cervical spine was performed following the standard protocol without intravenous contrast. Multiplanar CT image reconstructions of the cervical spine were also generated. RADIATION DOSE REDUCTION: This exam was performed according to the departmental dose-optimization program which includes automated exposure control, adjustment of the mA and/or kV according to patient size and/or use of iterative reconstruction technique. COMPARISON:  None Available. FINDINGS: CT HEAD FINDINGS Brain: Normal anatomic configuration. Parenchymal volume loss is commensurate with the patient's age. Mild periventricular white matter changes are present likely reflecting the sequela of small vessel ischemia. No abnormal intra or extra-axial mass lesion or fluid collection. No abnormal mass effect or midline shift. No evidence of acute intracranial hemorrhage or infarct. Ventricular size is normal. Cerebellum unremarkable. Vascular: No asymmetric hyperdense vasculature at the skull base. Skull: Intact Sinuses/Orbits: Paranasal sinuses are clear. Orbits are unremarkable. Other: Mastoid air cells and middle ear cavities are clear. CT CERVICAL SPINE FINDINGS Alignment: 3 mm retrolisthesis C6-7. Otherwise straightening of the cervical spine. Skull base and vertebrae: Craniocervical alignment is normal. The atlantodental interval is not widened. No acute fracture of the cervical spine. Vertebral body height is preserved. Degenerative ankylosis of C6-7 vertebral bodies. Soft tissues and spinal canal: No prevertebral fluid or swelling. No visible canal hematoma. Broad-based posterior disc osteophyte complex at C5-6 results in moderate central canal stenosis with mild flattening of the thecal sac and AP diameter of the  spinal canal of 7 mm. No high-grade canal stenosis. Advanced vascular calcifications are seen within the carotid bifurcations. Disc levels: Disc space narrowing and endplate remodeling is seen at C5-6 in keeping with changes of advanced degenerative disc disease. Degenerative ankylosis and obliteration of the C6-7 disc space. Milder degenerative changes noted at C4-5. Prevertebral soft tissues are not thickened on sagittal reformats. No significant neuroforaminal narrowing. Upper chest: Negative. Other: None IMPRESSION: 1. No acute intracranial abnormality. No calvarial fracture. 2. No acute fracture or listhesis of the cervical spine. 3. 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 4. Advanced vascular calcifications within the carotid bifurcations. Electronically Signed   By: Dorethia Molt M.D.   On: 01/14/2024 00:49   CT Cervical Spine Wo Contrast Result Date: 01/14/2024 CLINICAL DATA:  Neck  trauma (Age >= 65y); Head trauma, minor (Age >= 65y). Dizziness, syncope, fall EXAM: CT HEAD WITHOUT CONTRAST CT CERVICAL SPINE WITHOUT CONTRAST TECHNIQUE: Multidetector CT imaging of the head and cervical spine was performed following the standard protocol without intravenous contrast. Multiplanar CT image reconstructions of the cervical spine were also generated. RADIATION DOSE REDUCTION: This exam was performed according to the departmental dose-optimization program which includes automated exposure control, adjustment of the mA and/or kV according to patient size and/or use of iterative reconstruction technique. COMPARISON:  None Available. FINDINGS: CT HEAD FINDINGS Brain: Normal anatomic configuration. Parenchymal volume loss is commensurate with the patient's age. Mild periventricular white matter changes are present likely reflecting the sequela of small vessel ischemia. No abnormal intra or extra-axial mass lesion or fluid collection. No abnormal mass  effect or midline shift. No evidence of acute intracranial hemorrhage or infarct. Ventricular size is normal. Cerebellum unremarkable. Vascular: No asymmetric hyperdense vasculature at the skull base. Skull: Intact Sinuses/Orbits: Paranasal sinuses are clear. Orbits are unremarkable. Other: Mastoid air cells and middle ear cavities are clear. CT CERVICAL SPINE FINDINGS Alignment: 3 mm retrolisthesis C6-7. Otherwise straightening of the cervical spine. Skull base and vertebrae: Craniocervical alignment is normal. The atlantodental interval is not widened. No acute fracture of the cervical spine. Vertebral body height is preserved. Degenerative ankylosis of C6-7 vertebral bodies. Soft tissues and spinal canal: No prevertebral fluid or swelling. No visible canal hematoma. Broad-based posterior disc osteophyte complex at C5-6 results in moderate central canal stenosis with mild flattening of the thecal sac and AP diameter of the spinal canal of 7 mm. No high-grade canal stenosis. Advanced vascular calcifications are seen within the carotid bifurcations. Disc levels: Disc space narrowing and endplate remodeling is seen at C5-6 in keeping with changes of advanced degenerative disc disease. Degenerative ankylosis and obliteration of the C6-7 disc space. Milder degenerative changes noted at C4-5. Prevertebral soft tissues are not thickened on sagittal reformats. No significant neuroforaminal narrowing. Upper chest: Negative. Other: None IMPRESSION: 1. No acute intracranial abnormality. No calvarial fracture. 2. No acute fracture or listhesis of the cervical spine. 3. 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 4. Advanced vascular calcifications within the carotid bifurcations. Electronically Signed   By: Dorethia Molt M.D.   On: 01/14/2024 00:49   CT Angio Chest PE W and/or Wo Contrast Result Date: 01/14/2024 CLINICAL DATA:  High probability pulmonary embolism,  syncope EXAM: CT ANGIOGRAPHY CHEST WITH CONTRAST TECHNIQUE: Multidetector CT imaging of the chest was performed using the standard protocol during bolus administration of intravenous contrast. Multiplanar CT image reconstructions and MIPs were obtained to evaluate the vascular anatomy. RADIATION DOSE REDUCTION: This exam was performed according to the departmental dose-optimization program which includes automated exposure control, adjustment of the mA and/or kV according to patient size and/or use of iterative reconstruction technique. CONTRAST:  75mL OMNIPAQUE  IOHEXOL  350 MG/ML SOLN COMPARISON:  11/12/2023 FINDINGS: Cardiovascular: Advanced aortic and mitral valvular calcification. Extensive multi-vessel coronary artery calcification. Cardiac size is mildly enlarged. Adequate opacification of the pulmonary arterial tree. No intraluminal filling defect identified to suggest acute pulmonary embolism. Central pulmonary arteries are of normal caliber. No pericardial effusion. Mild atherosclerotic calcification within the thoracic aorta. Stable fusiform dilation of the ascending aorta measuring 4.1 cm in maximal diameter. Mediastinum/Nodes: No enlarged mediastinal, hilar, or axillary lymph nodes. Thyroid gland, trachea, and esophagus demonstrate no significant findings. 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 There is extensive airway impaction left lower lobe with complete collapse of the left lower lobe. Mild right basilar dependent atelectasis. No pneumothorax. Trace right pleural effusion Upper Abdomen: Cholelithiasis without pericholecystic inflammatory change. No acute abnormality within the visualized upper abdomen. Musculoskeletal: No chest wall abnormality. No acute or significant osseous findings. Review of the MIP images confirms the above findings. IMPRESSION: 1. No pulmonary embolism. 2. Extensive airway  impaction with complete collapse of the left lower lobe. 3. 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 4. Extensive multi-vessel coronary artery calcification. 5. Advanced aortic and mitral valvular calcification. Echocardiography may be helpful to assess the degree of valvular dysfunction 6. Stable fusiform dilation of the ascending aorta measuring 4.1 cm in maximal diameter. Recommend annual imaging followup by CTA or MRA. This recommendation follows 2010 ACCF/AHA/AATS/ACR/ASA/SCA/SCAI/SIR/STS/SVM Guidelines for the Diagnosis and Management of Patients with Thoracic Aortic Disease. Circulation. 2010; 121: Z733-z630. Aortic aneurysm NOS (ICD10-I71.9) 7. Cholelithiasis. 8. Aortic atherosclerosis. Aortic Atherosclerosis (ICD10-I70.0). Electronically Signed   By: Dorethia Molt M.D.   On: 01/14/2024 00:39   DG Elbow Complete Left Result Date: 01/14/2024 CLINICAL DATA:  Fall, left elbow injury EXAM: LEFT ELBOW - COMPLETE 3+ VIEW COMPARISON:  None Available. FINDINGS: There is no evidence of fracture, dislocation, or joint effusion. There is no evidence of arthropathy or other focal bone abnormality. Vascular calcifications are noted. IMPRESSION: 1. Negative. Electronically Signed   By: Dorethia Molt M.D.   On: 01/14/2024 00:20   DG Wrist Complete Left Result Date: 01/14/2024 CLINICAL DATA:  Chest pain, dyspnea EXAM: LEFT WRIST - COMPLETE 3+ VIEW COMPARISON:  None Available. FINDINGS: Normal alignment. No acute fracture or dislocation. Mild polyarticular degenerative changes. Advanced vascular calcifications are noted. IMPRESSION: 1. Mild polyarticular degenerative change. Electronically Signed   By: Dorethia Molt M.D.   On: 01/14/2024 00:19   DG Hand Complete Left Result Date: 01/14/2024 CLINICAL DATA:  Fall, left hand injury EXAM: LEFT HAND - COMPLETE 3+ VIEW COMPARISON:  None Available. FINDINGS: There is no evidence of fracture or  dislocation. There is no evidence of arthropathy or other focal bone abnormality. Vascular calcifications are noted. IMPRESSION: 1. Negative. Electronically Signed   By: Dorethia Molt M.D.   On: 01/14/2024 00:16   CT Head Wo Contrast Result Date: 01/06/2024 EXAM: CT HEAD AND CERVICAL SPINE 01/06/2024 05:07:11 PM TECHNIQUE: CT of the head and cervical spine was performed without the administration of intravenous contrast. Multiplanar reformatted images are provided for review. Automated exposure control, iterative reconstruction, and/or weight based adjustment of the mA/kV was utilized to reduce the radiation dose to as low as reasonably achievable. COMPARISON: CT head and cervical spine 06/12/2022. MRI head 06/25/2020. CLINICAL HISTORY: Fall. [employer]. FINDINGS: CT HEAD BRAIN AND VENTRICLES: Mild cerebral atrophy. Periventricular white matter hypodensities, similar to the prior CT and nonspecific but compatible with minimal chronic small vessel ischemic disease. No acute intracranial hemorrhage. No mass effect or midline shift. No abnormal extra-axial fluid collection. Gray-white differentiation is maintained. No hydrocephalus. 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 SINUSES AND MASTOIDS: No acute abnormality. SOFT TISSUES AND SKULL: Calcified atherosclerosis of the skull base. No acute skull fracture. No acute soft tissue abnormality. CT CERVICAL SPINE BONES AND ALIGNMENT: Chronic straightening of the  normal cervical lordosis. Grade 1 retrolisthesis of C6 on C7. No acute fracture or suspicious lesion. DEGENERATIVE CHANGES: Moderate disc space narrowing at C5-6 and severe narrowing at C6-7 with partial fusion between the vertebral bodies at the latter. Broad-based posterior disc osteophyte complex at C5-6 results in mild  spinal stenosis. Small central disc protrusion at C4-5 resulting in at most mild spinal stenosis. SOFT TISSUES: No prevertebral soft tissue swelling. Prominent atherosclerotic calcification of the carotid bifurcations. Multiple thyroid nodules measuring up to 1 cm with no follow up imaging required. IMPRESSION: 1. No acute intracranial abnormality or cervical spine fracture. 2. Cervical disc degeneration as above. Electronically signed by: Dasie Hamburg MD 01/06/2024 05:41 PM EDT RP Workstation: [id number]   CT Cervical Spine Wo Contrast Result Date: 01/06/2024 EXAM: CT HEAD AND CERVICAL SPINE 01/06/2024 05:07:11 PM TECHNIQUE: CT of the head and cervical spine was performed without the administration of intravenous contrast. Multiplanar reformatted images are provided for review. Automated exposure control, iterative reconstruction, and/or weight based adjustment of the mA/kV was utilized to reduce the radiation dose to as low as reasonably achievable. COMPARISON: CT head and cervical spine 06/12/2022. MRI head 06/25/2020. CLINICAL HISTORY: Fall. [employer]. FINDINGS: CT HEAD BRAIN AND VENTRICLES: Mild cerebral atrophy. Periventricular white matter hypodensities, similar to the prior CT and nonspecific but compatible with minimal chronic small vessel ischemic disease. No acute intracranial hemorrhage. No mass effect or midline shift. No abnormal extra-axial fluid collection. Gray-white differentiation is maintained. No hydrocephalus. 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 SINUSES AND MASTOIDS: No acute abnormality. SOFT TISSUES AND SKULL: Calcified atherosclerosis of the skull base. No acute skull fracture. No acute soft tissue abnormality. CT CERVICAL SPINE BONES AND ALIGNMENT: Chronic straightening of the normal cervical  lordosis. Grade 1 retrolisthesis of C6 on C7. No acute fracture or suspicious lesion. DEGENERATIVE CHANGES: Moderate disc space narrowing at C5-6 and severe narrowing at C6-7 with partial fusion between the vertebral bodies at the latter. Broad-based posterior disc osteophyte complex at C5-6 results in mild spinal stenosis. Small central disc protrusion at C4-5 resulting in at most mild spinal stenosis. SOFT TISSUES: No prevertebral soft tissue swelling. Prominent atherosclerotic calcification of the carotid bifurcations. Multiple thyroid nodules measuring up to 1 cm with no follow up imaging required. IMPRESSION: 1. No acute intracranial abnormality or cervical spine fracture. 2. Cervical disc degeneration as above. Electronically signed by: Dasie Hamburg MD 01/06/2024 05:41 PM EDT RP Workstation: [id number]   VAS US  DUPLEX DIALYSIS ACCESS (AVF, AVG) Result Date: 01/03/2024 DIALYSIS ACCESS Patient Name:  [facility]  Date of Exam:   01/02/2024 Medical Rec #: [id number]     Accession #:    [id number] Date of Birth: 1955-08-21    Patient Gender: F Patient Age:   89 years Exam Location:  [facility] Procedure:      VAS US  DUPLEX DIALYSIS ACCESS (AVF, AVG) Referring Phys: SELINDA GU --------------------------------------------------------------------------------  Reason for Exam: Excess bleeding post dialysis. Access Site: Left Upper Extremity. Access Type: Brach Ax. History: Left Btach Ax AVG created 03/2021. PTA of AVG 12/2021. Comparison Study: 10/2021 outside LTD evak study for patency only Performing Technologist: Jerel Croak RVT  Examination Guidelines: A complete evaluation includes B-mode imaging, spectral Doppler, color Doppler, and power Doppler as needed of all accessible portions of each vessel. Unilateral testing is considered  an integral part of a complete examination. Limited examinations for reoccurring indications may be performed as noted.  Findings:    +--------------------+----------+-----------------+-------------------------+ AVG                 PSV (cm/s)Flow Vol (mL/min)        Describe          +--------------------+----------+-----------------+-------------------------+ Native artery inflow   179          1872                                 +--------------------+----------+-----------------+-------------------------+ Arterial anastomosis   252                                               +--------------------+----------+-----------------+-------------------------+ Prox graft             336                                               +--------------------+----------+-----------------+-------------------------+ Mid graft              301                     pseudoaneurysm and 1.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 +--------------------+----------+-----------------+-------------------------+ Distal graft           295                                               +--------------------+----------+-----------------+-------------------------+ Venous anastomosis     407                                               +--------------------+----------+-----------------+-------------------------+ Venous outflow         395                                               +--------------------+----------+-----------------+-------------------------+ +--------------+------------+----------+---------+---------+-------------------+                 Diameter  Depth (cm)Branching   PSV       Flow Volume                       (cm)                        (cm/s)       (ml/min)       +--------------+------------+----------+---------+---------+-------------------+ Lt Rad Art Dis                                  30                        +--------------+------------+----------+---------+---------+-------------------+ antegrade                                                                  +--------------+------------+----------+---------+---------+-------------------+  Summary: Patent arteriovenous graft.  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  *See table(s) above for measurements and observations.  Diagnosing physician: Selinda Gu MD Electronically signed by Selinda Gu MD on 01/03/2024 at 8:17:31 AM.   --------------------------------------------------------------------------------   Final     Microbiology: Recent Results (from the past 240 hours)  Urine Culture     Status: Abnormal   Collection Time: 01/13/24 11:07 PM   Specimen: Urine, Random  Result Value Ref Range Status   Specimen Description   Final    URINE, RANDOM Performed at [facility], [address] [place], [place] [address]    Special Requests   Final    NONE Reflexed from [phone] Performed at [facility], [address] [place], [place] [address]    Culture >=100,000 COLONIES/mL ESCHERICHIA COLI (A)  Final   Report Status 01/16/2024 FINAL  Final   Organism ID, Bacteria ESCHERICHIA COLI (A)  Final      Susceptibility   Escherichia coli - MIC*    AMPICILLIN  <=2 SENSITIVE Sensitive     CEFAZOLIN  (URINE) Value in next row Sensitive      <=1 SENSITIVEThis is a modified FDA-approved test that has been validated and its performance characteristics determined by the reporting laboratory.  This laboratory is certified under the Clinical Laboratory Improvement Amendments CLIA as qualified to perform high complexity clinical laboratory testing.    CEFEPIME  Value in next row Sensitive      <=1 SENSITIVEThis is a modified FDA-approved test that has been validated and its performance characteristics determined by the reporting laboratory.  This laboratory is certified under the Clinical Laboratory Improvement Amendments CLIA as qualified to perform high complexity clinical laboratory testing.    ERTAPENEM Value in next row Sensitive      <=1  SENSITIVEThis is a modified FDA-approved test that has been validated and its performance characteristics determined by the reporting laboratory.  This laboratory is certified under the Clinical Laboratory Improvement Amendments CLIA as qualified to perform high complexity clinical laboratory testing.    CEFTRIAXONE  Value in next row Sensitive      <=1 SENSITIVEThis is a modified FDA-approved test that has been validated and its performance characteristics determined by the reporting laboratory.  This laboratory is certified under the Clinical Laboratory Improvement Amendments CLIA as qualified to perform high complexity clinical laboratory testing.    CIPROFLOXACIN Value in next row Intermediate      <=1 SENSITIVEThis is a modified FDA-approved test that has been validated and its performance characteristics determined by the reporting laboratory.  This laboratory is certified under the Clinical Laboratory Improvement Amendments CLIA as qualified to perform high complexity clinical laboratory testing.    GENTAMICIN Value in next row Sensitive      <=1 SENSITIVEThis is a modified FDA-approved test that has been validated and its performance characteristics determined by the reporting laboratory.  This laboratory is certified under the Clinical Laboratory Improvement Amendments CLIA as qualified to perform high complexity clinical laboratory testing.    NITROFURANTOIN Value in next row Sensitive      <=1 SENSITIVEThis is a modified FDA-approved test that has been validated and its performance characteristics determined by the reporting laboratory.  This laboratory is certified under the Clinical Laboratory Improvement Amendments CLIA as qualified to perform high complexity clinical laboratory testing.    TRIMETH/SULFA Value in next row Sensitive      <=1 SENSITIVEThis is a modified  FDA-approved test that has been validated and its performance characteristics determined by the reporting laboratory.  This  laboratory is certified under the Clinical Laboratory Improvement Amendments CLIA as qualified to perform high complexity clinical laboratory testing.    AMPICILLIN /SULBACTAM Value in next row Sensitive      <=1 SENSITIVEThis is a modified FDA-approved test that has been validated and its performance characteristics determined by the reporting laboratory.  This laboratory is certified under the Clinical Laboratory Improvement Amendments CLIA as qualified to perform high complexity clinical laboratory testing.    PIP/TAZO Value in next row Sensitive ug/mL     <=4 SENSITIVEThis is a modified FDA-approved test that has been validated and its performance characteristics determined by the reporting laboratory.  This laboratory is certified under the Clinical Laboratory Improvement Amendments CLIA as qualified to perform high complexity clinical laboratory testing.    MEROPENEM Value in next row Sensitive      <=4 SENSITIVEThis is a modified FDA-approved test that has been validated and its performance characteristics determined by the reporting laboratory.  This laboratory is certified under the Clinical Laboratory Improvement Amendments CLIA as qualified to perform high complexity clinical laboratory testing.    * >=100,000 COLONIES/mL ESCHERICHIA COLI  Blood culture (single)     Status: None (Preliminary result)   Collection Time: 01/13/24 11:32 PM   Specimen: BLOOD  Result Value Ref Range Status   Specimen Description BLOOD BLOOD RIGHT ARM  Final   Special Requests   Final    BOTTLES DRAWN AEROBIC AND ANAEROBIC Blood Culture adequate volume   Culture   Final    NO GROWTH 3 DAYS Performed at [facility], [address] [place], [place] [address]    Report Status PENDING  Incomplete     Labs: CBC: Recent Labs  Lab 01/13/24 2259 01/14/24 0452 01/15/24 0327 01/16/24 0326  WBC 5.9 5.0 4.0 5.1  NEUTROABS 4.0  --   --   --   HGB 10.9* 9.6* 9.4* 9.8*  HCT 35.1* 30.0* 29.4* 31.1*   MCV 102.0* 100.7* 99.3 100.0  PLT 139* 120* 128* 145*   Basic Metabolic Panel: Recent Labs  Lab 01/13/24 2259 01/14/24 0452 01/15/24 0327 01/16/24 0326  NA 136 135 135 138  K 5.3* 4.5 4.7 4.3  CL 101 102 96* 98  CO2 24 24 27 30   GLUCOSE 84 152* 113* 83  BUN 86* 85* 45* 35*  CREATININE 4.77* 4.60* 3.18* 2.86*  CALCIUM  9.1 8.6* 8.8* 9.1  MG  --   --  2.0 2.1  PHOS  --   --  4.1 3.5   Liver Function Tests: Recent Labs  Lab 01/13/24 2259  AST 38  ALT 58*  ALKPHOS 113  BILITOT 0.9  PROT 6.8  ALBUMIN  3.2*   No results for input(s): LIPASE, AMYLASE in the last 168 hours. No results for input(s): AMMONIA in the last 168 hours. Cardiac Enzymes: No results for input(s): CKTOTAL, CKMB, CKMBINDEX, TROPONINI in the last 168 hours. BNP (last 3 results) Recent Labs    01/13/24 2259  BNP 1,301.5*   CBG: Recent Labs  Lab 01/15/24 1421 01/15/24 1723 01/15/24 2106 01/16/24 0727 01/16/24 1114  GLUCAP 141* 138* 167* 98 157*    Time spent: 35 minutes  Signed:  Elvan Sor  [facility] 01/16/2024 1:21 PM

## 2024-01-16 NOTE — [place] FL2 (Signed)
 [place]  MEDICAID FL2 LEVEL OF CARE FORM     IDENTIFICATION  Patient Name: Katie [name] Birthdate: [birth year] Sex: female Admission Date (Current Location): 01/13/2024  [employer] and [place] Number:  Chiropodist and Address:  [facility], [address], [place], [place] [address]      Provider Number: [id number]  Attending Physician Name and Address:  Von Bellis, MD  Relative Name and Phone Number:       Current Level of Care: Hospital Recommended Level of Care: Assisted Living Facility Prior Approval Number:    Date Approved/Denied:   PASRR Number:    Discharge Plan: Other (Comment) (ALF)    Current Diagnoses: Patient Active Problem List   Diagnosis Date Noted   Syncope 01/15/2024   Acute UTI 01/15/2024   Chest pain 01/15/2024   Head injury 01/15/2024   Sepsis due to pneumonia (HCC) 01/14/2024   GERD without esophagitis 01/14/2024   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   End-stage renal disease on hemodialysis (HCC) 01/14/2024   Chronic pain syndrome 08/26/2023   Pharmacologic therapy 08/26/2023   Disorder of skeletal system 08/26/2023   Problems influencing health status 08/26/2023   CHF (congestive heart failure) (HCC) 08/15/2023   Orthostatic hypotension 06/12/2022   Vomiting and diarrhea 06/12/2022   Cellulitis of left upper extremity 12/11/2021   Type II diabetes mellitus with renal manifestations (HCC) 12/11/2021   Anemia in ESRD (end-stage renal disease) (HCC) 12/11/2021   Elevated lactic acid level 12/11/2021   Chronic diastolic CHF (congestive heart failure) (HCC) 12/11/2021   Left arm cellulitis 11/09/2021   Adjustment disorder with mixed anxiety and depressed mood 11/07/2021   Aortic stenosis, severe 11/01/2021   Fecal impaction (HCC) 10/27/2021   Bent bone fracture of ulna 10/24/2021   Hospital acquired PNA 10/24/2021   Sepsis (HCC) 10/23/2021   Cellulitis 10/23/2021   Right  flank pain 09/23/2021   Iron deficiency anemia, unspecified 03/27/2021   Allergy, unspecified, initial encounter 03/22/2021   Anaphylactic shock, unspecified, initial encounter 03/22/2021   Unspecified jaundice 03/15/2021   Allergy status to penicillin 03/14/2021   Anemia in chronic kidney disease 03/14/2021   Chronic diastolic (congestive) heart failure (HCC) 03/14/2021   Dependence on renal dialysis (HCC) 03/14/2021   Depression, unspecified 03/14/2021   Hypertensive heart and chronic kidney disease with heart failure and with stage 5 chronic kidney disease, or end stage renal disease (HCC) 03/14/2021   Personal history of nicotine  dependence 03/14/2021   Secondary hyperparathyroidism of renal origin (HCC) 03/14/2021   Frequent falls 03/07/2021   Financial difficulty 03/07/2021   ESRD (end stage renal disease) on dialysis (HCC) 12/23/2020   Persistent headaches 06/27/2020   ATN (acute tubular necrosis) (HCC)    Pulmonary hypertension, unspecified (HCC)    Chronic kidney disease (CKD), stage IV (severe) (HCC)    Pleural effusion    Acute heart failure with preserved ejection fraction (HFpEF) (HCC)    Pressure injury of skin 05/01/2020   Hyperkalemia    Volume overload 04/30/2020   Hypertension    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HCC)    Type 2 diabetes mellitus (HCC)     Orientation RESPIRATION BLADDER Height & Weight     Self, Time, Situation, Place  Normal Continent Weight: 119 lb 4.3 oz (54.1 kg) Height:  5' 6 (167.6 cm)  BEHAVIORAL SYMPTOMS/MOOD NEUROLOGICAL BOWEL NUTRITION STATUS   (None)  (None) Continent Diet (Carb modified).  AMBULATORY STATUS COMMUNICATION OF NEEDS  Skin     Verbally Skin abrasions, Bruising, Other (Comment), PU Stage and Appropriate Care (Erythema/redness. Wound on right elbow (no dressing).)   PU Stage 2 Dressing:  (Coccyx: Foam.)                   Personal Care Assistance Level of Assistance              Functional  Limitations Info  Sight, Hearing, Speech Sight Info: Impaired Hearing Info: Adequate Speech Info: Adequate    SPECIAL CARE FACTORS FREQUENCY                       Contractures Contractures Info: Not present    Additional Factors Info  Code Status, Allergies Code Status Info: DNR Allergies Info: Penicillins           Current Medications (01/16/2024):  This is the current hospital active medication list Current Facility-Administered Medications  Medication Dose Route Frequency Provider Last Rate Last Admin   acetaminophen  (TYLENOL ) tablet 650 mg  650 mg Oral Q6H PRN Mansy, Jan A, MD       Or   acetaminophen  (TYLENOL ) suppository 650 mg  650 mg Rectal Q6H PRN Mansy, [place] LABOR, MD       aspirin  EC tablet 81 mg  81 mg Oral Daily Mansy, Jan A, MD   81 mg at 01/16/24 9161   azithromycin  (ZITHROMAX ) 500 mg in sodium chloride  0.9 % 250 mL IVPB  500 mg Intravenous Q24H Mansy, Jan A, MD   Stopped at 01/16/24 0007   bisacodyl  (DULCOLAX) EC tablet 10 mg  10 mg Oral QHS Von Bellis, MD       budesonide  (PULMICORT ) nebulizer solution 0.5 mg  0.5 mg Nebulization BID Kasa, Kurian, MD   0.5 mg at 01/16/24 9252   calcitRIOL  (ROCALTROL ) capsule 0.25 mcg  0.25 mcg Oral Daily Von Bellis, MD   0.25 mcg at 01/16/24 0838   calcium  carbonate (TUMS - dosed in mg elemental calcium ) chewable tablet 200 mg of elemental calcium   1 tablet Oral TID PRN Mansy, Jan A, MD       cefTRIAXone  (ROCEPHIN ) 2 g in sodium chloride  0.9 % 100 mL IVPB  2 g Intravenous Q24H Von Bellis, MD   Stopped at 01/15/24 2218   Chlorhexidine  Gluconate Cloth 2 % PADS 6 each  6 each Topical Q0600 Breeze, Shantelle, NP   6 each at 01/16/24 0503   chlorpheniramine-HYDROcodone  (TUSSIONEX) 10-8 MG/5ML suspension 5 mL  5 mL Oral Q12H PRN Mansy, Jan A, MD   5 mL at 01/14/24 1422   diphenhydrAMINE  (BENADRYL ) injection 25 mg  25 mg Intravenous Q dialysis Breeze, Faith, NP   25 mg at 01/15/24 0940   DULoxetine  (CYMBALTA ) DR capsule 30  mg  30 mg Oral Daily Mansy, Jan A, MD   30 mg at 01/16/24 9160   epoetin  alfa-epbx (RETACRIT ) injection 4,000 Units  4,000 Units Intravenous Q M,W,F-1800 Breeze, Faith, NP   4,000 Units at 01/15/24 1031   famotidine  (PEPCID ) tablet 10 mg  10 mg Oral Daily Mansy, Jan A, MD   10 mg at 01/16/24 9161   feeding supplement (NEPRO CARB STEADY) liquid 237 mL  237 mL Oral BID BM Von Bellis, MD   237 mL at 01/16/24 1053   fluticasone  furoate-vilanterol (BREO ELLIPTA ) 200-25 MCG/ACT 1 puff  1 puff Inhalation Daily Von Bellis, MD   1 puff at 01/16/24 0839   guaiFENesin  (MUCINEX ) 12 hr tablet 600 mg  600 mg Oral BID Mansy, Jan A, MD   600 mg at 01/16/24 9161   heparin  injection 5,000 Units  5,000 Units Subcutaneous Q8H Belue, Nathan S, RPH   5,000 Units at 01/16/24 9495   insulin  aspart (novoLOG ) injection 0-9 Units  0-9 Units Subcutaneous TID WC Von Bellis, MD   1 Units at 01/15/24 1734   ipratropium-albuterol  (DUONEB) 0.5-2.5 (3) MG/3ML nebulizer solution 3 mL  3 mL Nebulization BID Von Bellis, MD       magnesium  hydroxide (MILK OF MAGNESIA) suspension 30 mL  30 mL Oral Daily PRN Mansy, Jan A, MD       melatonin tablet 2.5 mg  2.5 mg Oral QHS Mansy, Jan A, MD   2.5 mg at 01/15/24 2143   methylPREDNISolone  sodium succinate (SOLU-MEDROL ) 40 mg/mL injection 10 mg  10 mg Intravenous Q12H Kasa, Kurian, MD   10 mg at 01/16/24 0009   metoprolol  tartrate (LOPRESSOR ) tablet 25 mg  25 mg Oral BID PRN Von Bellis, MD       midodrine  (PROAMATINE ) tablet 5 mg  5 mg Oral Q M,W,F-HD Von Bellis, MD       ondansetron  (ZOFRAN ) tablet 4 mg  4 mg Oral Q6H PRN Mansy, Jan A, MD       Or   ondansetron  (ZOFRAN ) injection 4 mg  4 mg Intravenous Q6H PRN Mansy, Jan A, MD   4 mg at 01/15/24 0940   oxyCODONE  (Oxy IR/ROXICODONE ) immediate release tablet 5 mg  5 mg Oral Q6H PRN Mansy, Jan A, MD   5 mg at 01/16/24 0849   pantoprazole  (PROTONIX ) EC tablet 40 mg  40 mg Oral Daily Mansy, Jan A, MD   40 mg at 01/16/24 9161    polyethylene glycol (MIRALAX  / GLYCOLAX ) packet 17 g  17 g Oral BID Von Bellis, MD   17 g at 01/16/24 1004   topiramate  (TOPAMAX ) tablet 50 mg  50 mg Oral BID PRN Mansy, Jan A, MD   50 mg at 01/14/24 1457   traZODone  (DESYREL ) tablet 25 mg  25 mg Oral QHS PRN Mansy, Jan A, MD       traZODone  (DESYREL ) tablet 75 mg  75 mg Oral QHS Mansy, Jan A, MD   75 mg at 01/15/24 2142     Discharge Medications: STOP taking these medications     guaifenesin  100 MG/5ML syrup Commonly known as: ROBITUSSIN Replaced by: guaiFENesin  600 MG 12 hr tablet    loperamide  2 MG capsule Commonly known as: IMODIUM     Polyethylene Glycol 3350  Powd    REGULOID PO    saccharomyces boulardii 250 MG capsule Commonly known as: FLORASTOR    senna 8.6 MG Tabs tablet Commonly known as: SENOKOT    senna-docusate 8.6-50 MG tablet Commonly known as: Senokot-S           TAKE these medications     acetaminophen  500 MG tablet Commonly known as: TYLENOL  Take 500 mg by mouth 3 (three) times daily. Take additional 500 mg every 6 hours as needed for pain    Advair Diskus 250-50 MCG/ACT Aepb Generic drug: fluticasone -salmeterol Inhale 1 puff into the lungs 2 (two) times daily.    albuterol  108 (90 Base) MCG/ACT inhaler Commonly known as: VENTOLIN  HFA Inhale 1 puff into the lungs every 6 (six) hours as needed for wheezing.    aspirin  81 MG chewable tablet Chew 81 mg by mouth daily.    azithromycin  500 MG tablet Commonly known as: Zithromax  Take 1 tablet (  500 mg total) by mouth daily for 3 days.    bisacodyl  5 MG EC tablet Commonly known as: DULCOLAX Take 2 tablets (10 mg total) by mouth at bedtime.    calcitRIOL  0.25 MCG capsule Commonly known as: ROCALTROL  Take 1 capsule (0.25 mcg total) by mouth daily. Start taking on: January 17, 2024    calcium  carbonate 500 MG chewable tablet Commonly known as: TUMS - dosed in mg elemental calcium  Chew 1 tablet by mouth 3 (three) times daily as needed  (GRED).    cefdinir  300 MG capsule Commonly known as: OMNICEF  Take 1 capsule (300 mg total) by mouth every other day for 4 doses.    DULoxetine  30 MG capsule Commonly known as: CYMBALTA  Take 30 mg by mouth daily.    famotidine  20 MG tablet Commonly known as: PEPCID  Take 10 mg by mouth at bedtime.    guaiFENesin  600 MG 12 hr tablet Commonly known as: MUCINEX  Take 1 tablet (600 mg total) by mouth 2 (two) times daily for 7 days. Replaces: guaifenesin  100 MG/5ML syrup    guaiFENesin -dextromethorphan  100-10 MG/5ML syrup Commonly known as: ROBITUSSIN DM Take 10 mLs by mouth every 6 (six) hours as needed for cough.    melatonin 3 MG Tabs tablet Take 3 mg by mouth at bedtime.    metoprolol  tartrate 25 MG tablet Commonly known as: LOPRESSOR  Take 1 tablet (25 mg total) by mouth 2 (two) times daily as needed (SBP >150).    midodrine  5 MG tablet Commonly known as: PROAMATINE  Take 5 mg by mouth. Monday, Wednesday, Friday prior to dialysis    ondansetron  4 MG disintegrating tablet Commonly known as: ZOFRAN -ODT Take 1 tablet (4 mg total) by mouth every 8 (eight) hours as needed for nausea or vomiting.    ondansetron  4 MG tablet Commonly known as: ZOFRAN  Take 4 mg by mouth every Monday, Wednesday, and Friday.    oxycodone  5 MG capsule Commonly known as: OXY-IR Take 1 capsule (5 mg total) by mouth every 6 (six) hours.    pantoprazole  40 MG tablet Commonly known as: PROTONIX  Take 40 mg by mouth daily.    polyethylene glycol 17 g packet Commonly known as: MIRALAX  / GLYCOLAX  Take 17 g by mouth 2 (two) times daily.    predniSONE  20 MG tablet Commonly known as: DELTASONE  Take 1 tablet (20 mg total) by mouth daily with breakfast for 7 days.    Sarna Sensitive 1 % Lotn Generic drug: pramoxine Apply 1 Application topically 2 (two) times daily.    topiramate  50 MG tablet Commonly known as: TOPAMAX  Take 1 tablet (50 mg total) by mouth 2 (two) times daily as needed  (headache).    traZODone  50 MG tablet Commonly known as: DESYREL  Take 75 mg by mouth at bedtime.    Relevant Imaging Results:  Relevant Lab Results:   Additional Information SS#: [id number]. HD MWF.  Lauraine JAYSON Carpen, LCSW

## 2024-01-16 NOTE — TOC Transition Note (Signed)
 Transition of Care [facility]) - Discharge Note   Patient Details  Name: Katie [name] MRN: [id number] Date of Birth: [birth year]  Transition of Care [facility] CM/SW Contact:  Lauraine JAYSON Carpen, LCSW Phone Number: 01/16/2024, 2:28 PM   Clinical Narrative:   Patient has orders to discharge back to [facility]F today. CSW faxed FL2 and discharge summary to facility. Staff member, Damien, will pick her up. No further concerns. CSW signing off.  Final next level of care: Assisted Living Barriers to Discharge: Barriers Resolved   Patient Goals and CMS Choice            Discharge Placement                Patient to be transferred to facility by: ALF Staff member, Damien   Patient and family notified of of transfer: 01/16/24  Discharge Plan and Services Additional resources added to the After Visit Summary for       Post Acute Care Choice: NA                               Social Drivers of Health (SDOH) Interventions SDOH Screenings   Food Insecurity: No Food Insecurity (01/14/2024)  Housing: Low Risk  (01/14/2024)  Transportation Needs: No Transportation Needs (01/14/2024)  Utilities: Not At Risk (01/14/2024)  Social Connections: Socially Isolated (01/14/2024)  Tobacco Use: Medium Risk (01/06/2024)     Readmission Risk Interventions    01/16/2024   10:27 AM 06/13/2022    4:02 PM 09/27/2021    1:44 PM  Readmission Risk Prevention Plan  Transportation Screening  Complete   HRI or Home Care Consult   --  Palliative Care Screening   Not Applicable  Medication Review (RN Care Manager)  Complete   PCP or Specialist appointment within 3-5 days of discharge Complete    HRI or Home Care Consult  Complete   SW Recovery Care/Counseling Consult Complete    Palliative Care Screening Not Applicable Not Applicable   Skilled Nursing Facility Not Applicable Not Applicable

## 2024-01-16 NOTE — Plan of Care (Signed)
  Problem: Fluid Volume: Goal: Hemodynamic stability will improve Outcome: Progressing   Problem: Clinical Measurements: Goal: Diagnostic test results will improve Outcome: Progressing Goal: Signs and symptoms of infection will decrease Outcome: Progressing   Problem: Respiratory: Goal: Ability to maintain adequate ventilation will improve Outcome: Progressing   Problem: Education: Goal: Knowledge of General Education information will improve Description: Including pain rating scale, medication(s)/side effects and non-pharmacologic comfort measures Outcome: Progressing   Problem: Health Behavior/Discharge Planning: Goal: Ability to manage health-related needs will improve Outcome: Progressing   Problem: Activity: Goal: Risk for activity intolerance will decrease Outcome: Progressing   Problem: Nutrition: Goal: Adequate nutrition will be maintained Outcome: Progressing   Problem: Coping: Goal: Level of anxiety will decrease Outcome: Progressing   Problem: Elimination: Goal: Will not experience complications related to bowel motility Outcome: Progressing

## 2024-01-16 NOTE — Care Management Important Message (Signed)
 Important Message  Patient Details  Name: Katie [name] MRN: [id number] Date of Birth: [birth year]   Important Message Given:  Yes - Medicare IM     Katie [name] 01/16/2024, 1:52 PM

## 2024-01-16 NOTE — Progress Notes (Signed)
 Progress Note  Patient Name: Katie [name] Date of Encounter: 01/16/2024 [facility] Cardiologist: Redell Cave, MD   Interval Summary    Pressures high, but also low around HD. Patient is overall feeling better.   Vital Signs Vitals:   01/15/24 1933 01/16/24 0020 01/16/24 0400 01/16/24 0726  BP: (!) 149/70 (!) 97/58 (!) 163/64 (!) 171/88  Pulse: 93 87 76 83  Resp: 18 18 18 15   Temp: 98.6 F (37 C) 97.9 F (36.6 C) 97.7 F (36.5 C) 98.6 F (37 C)  TempSrc:   Oral   SpO2: 98% 98% 97% 98%  Weight:   54.1 kg   Height:        Intake/Output Summary (Last 24 hours) at 01/16/2024 0947 Last data filed at 01/16/2024 0920 Gross per 24 hour  Intake 928 ml  Output 2200 ml  Net -1272 ml      01/16/2024    4:00 AM 01/15/2024    9:20 AM 01/14/2024   12:30 PM  Last 3 Weights  Weight (lbs) 119 lb 4.3 oz 120 lb 13 oz 120 lb 13 oz  Weight (kg) 54.1 kg 54.8 kg 54.8 kg      Telemetry/ECG  NSR HR 70-80s, rare PVC - Personally Reviewed  Physical Exam  GEN: No acute distress.   Neck: No JVD Cardiac: RRR, + murmur, no rubs, or gallops.  Respiratory: Clear to auscultation bilaterally. GI: Soft, nontender, non-distended  MS: No edema   Relevant CV Studies:   Limited echo 01/15/24 1. Left ventricular ejection fraction, by estimation, is 60 to 65%. Left  ventricular ejection fraction by 3D volume is 52 %. The left ventricle has  normal function. There is moderate left ventricular hypertrophy. Left  ventricular diastolic parameters  are indeterminate. The average left ventricular global longitudinal strain  is -20.0 %. The global longitudinal strain is normal.   2. Right ventricular systolic function is normal. The right ventricular  size is normal. Mildly increased right ventricular wall thickness. There  is severely elevated pulmonary artery systolic pressure. The estimated  right ventricular systolic pressure  is 69.5 mmHg.   3. Trivial mitral valve regurgitation.  Moderate mitral stenosis;  transvalvular gradient likely accentuated by high-flow state. The mean  mitral valve gradient is 10.0 mmHg.   4. Tricuspid valve regurgitation is mild to moderate.   5. The aortic valve has an indeterminant number of cusps. There is  moderate calcification of the aortic valve. There is severe thickening of  the aortic valve. Aortic valve regurgitation is moderate. Severe aortic  valve stenosis. Aortic valve area, by  VTI measures 0.71 cm. Aortic valve mean gradient measures 47.0 mmHg.   6. The inferior vena cava is normal in size with <50% respiratory  variability, suggesting right atrial pressure of 8 mmHg.    R/L heart cath 08/15/23    Very Small Caliber Diagonal Branches: 1st Diag lesion is 90% stenosed. 2nd Diag lesion is 80% stenosed.   Mid LAD lesion is 55% stenosed.   LV end diastolic pressure is normal.   There is severe aortic valve stenosis.   Anticipated discharge date to be determined.   Plan per discussion with primary cardiology team and nephrologist would be to have the patient be admitted under the hospitalist service.  She will need to have dialysis done while she is here and have titration of medications.   Recommend Aspirin  81mg  daily for moderate CAD.   Dominance: Codominant  POST-OPERATIVE DIAGNOSIS:   Severe heavily calcified aortic  stenosis with mean gradient by cardiac catheterization of 40 to 50 mmHg (peak to peak gradient 59 mmHg) Single-vessel disease with a 60% LAD stenosis just after tandem very small caliber D1 and D2 (ostium of D1-D2 a roughly 90 and 80%); large codominant LCx with relatively no significant disease; moderate caliber codominant RCA with minimal disease in the PDA. RHC hemodynamics: RAP 1 mmHg, RV P-EDP 44/1-2 mmHg; PAP-mean 44/12-24 mmHg, PCWP mean 5 mmHg.;  Fick Cardiac Output-Index 6.41-3.87 with ao sat 93% and PA sat 70%. LV P-EDP 191/1-23 mmHg; AO P-MAP 140/61-95 mmHg Aortic AoV gradient P-P59 0.5 mmHg; mean AoV  gradient estimated 45 to 50 mmHg     PT TO BE ADMITTED PER NEPHROLOGIST, DR LATEEF    Plan per discussion with primary cardiology team and nephrologist would be to have the patient be admitted under the hospitalist service.  She will need to have dialysis done while she is here and have titration of medications.   Recommend Aspirin  81mg  daily for moderate CAD.   Will need outpatient structural heart team   Echo 06/2023 1. Left ventricular ejection fraction, by estimation, is 60 to 65%. The  left ventricle has normal function. The left ventricle has no regional  wall motion abnormalities. There is moderate left ventricular hypertrophy.  Left ventricular diastolic  parameters are consistent with Grade I diastolic dysfunction (impaired  relaxation).   2. Right ventricular systolic function is normal. The right ventricular  size is normal. There is mildly elevated pulmonary artery systolic  pressure. The estimated right ventricular systolic pressure is 40.5 mmHg.   3. Left atrial size was mildly dilated.   4. The mitral valve is normal in structure. No evidence of mitral valve  regurgitation. No evidence of mitral stenosis. The mean mitral valve  gradient is 4.0 mmHg. Moderate mitral annular calcification.   5. The aortic valve is calcified. There is severe calcifcation of the  aortic valve. Aortic valve regurgitation is mild. Severe aortic valve  stenosis. Aortic valve area, by VTI measures 0.87 cm. Aortic valve mean  gradient measures 36.5 mmHg. Aortic  valve Vmax measures 4.75 m/s.   6. There is borderline dilatation of the ascending aorta, measuring 39  mm.   7. The inferior vena cava is normal in size with greater than 50%  respiratory variability, suggesting right atrial pressure of 3 mmHg.   Comparison(s): Previous AV meas, peak PG, 29 mmHg mean PG.    Echo 06/2022  1. Left ventricular ejection fraction, by estimation, is 60 to 65%. The  left ventricle has normal  function. The left ventricle has no regional  wall motion abnormalities. There is severe concentric left ventricular  hypertrophy. Left ventricular diastolic   parameters are consistent with Grade III diastolic dysfunction  (restrictive).   2. Right ventricular systolic function is normal. The right ventricular  size is normal.   3. Left atrial size was mildly dilated.   4. Right atrial size was mildly dilated.   5. The mitral valve is degenerative. Mild mitral valve regurgitation. No  evidence of mitral stenosis. Moderate to severe mitral annular  calcification.   6. The aortic valve is calcified. Aortic valve regurgitation is mild.  Moderate to severe aortic valve stenosis.   7. The inferior vena cava is normal in size with greater than 50%  respiratory variability, suggesting right atrial pressure of 3 mmHg.   Conclusion(s)/Recommendation(s): Valvular findings as outlined below    RHC 04/2020 Conclusions: Mildly elevated left heart, right heart, and  pulmonary artery pressures. Normal to supranormal Fick cardiac output/index. Recommendations: Continue gentle diuresis. Consider further workup of potential causes of high-output heart failure.   Lonni Hanson, MD [facility] Assessment & Plan   Syncope Severe AS - Patient presented with syncope found to have acute hypoxic respiratory failure, sepsis due to pneumonia, left lower lung collapse, acute lower UTI  - Patient has history of severe aortic stenosis and outpatient plan was for TAVR, but she missed TAVR procedure date d/t lack of transportation - Patient does not remember symptoms prior to her fall.  She was out for only a few seconds before coming to.  She was on the ground for 30 minutes before being found - orthostatics negative - Bps have been high, but are low on HD days - Repeat limited echo showed LVEF 60-65%, mod MS, mild to md TR, severe AS - suspect syncope with the above contributing factors in the setting  of severe AS - structural team has been notified and it's felt hospice care may be the best option for the patient. Will defer to structural valve team. Patient would like to revisit TAVR as OP   Sepsis secondary to pneumonia Left lower lung collapse CAP Acute lower UTI Acute hypoxic respiratory failure due to pneumonia - Status post IV fluids - IV antibiotics per IM - Blood cultures ordered - Pulmonology following - IV Solu-Medrol  and DuoNeb   CAD - No chest pain reported - HS trop minimally elevated 31>23, not consistent with ACS - Continue aspirin    ESRD on HD - Nephrology following  Labile HTN - BP very high at times, but low around HD - she has midodrine  5mg  on M,W,F (HD days) - consider antihypertensive medication on non-HD days    For questions or updates, please contact [facility] Please consult [email or web address] for contact info under       Signed, Riddick Nuon VEAR Fishman, PA-C

## 2024-01-16 NOTE — Progress Notes (Signed)
 [facility]  ROUNDING NOTE   Subjective:   Katie [name] is a 68 year old female with past medical history of ESRD with hemodialysis, diabetes mellitus, and hypertension. Patient presents to ED with fall and syncopal. Patient will be admitted for Community acquired pneumonia [J18.9] Acute UTI [N39.0] Aortic stenosis, severe [I35.0] Injury of head, initial encounter [S09.90XA] Syncope, unspecified syncope type [R55] Chest pain, unspecified type [R07.9]  Patient is known to our practice and receives outpatient dialysis treatments at [facility] on a MWF schedule.   Update: Patient sitting up in bed Denies discomfort Room air  States she wants to TAVR, she wants to continue to fight, not ready to give up.    Objective:  Vital signs in last 24 hours:  Temp:  [97.7 F (36.5 C)-98.6 F (37 C)] 98.6 F (37 C) (09/04 0726) Pulse Rate:  [76-99] 91 (09/04 1003) Resp:  [11-26] 15 (09/04 0726) BP: (97-179)/(58-88) 140/65 (09/04 1003) SpO2:  [94 %-99 %] 98 % (09/04 0726) Weight:  [54.1 kg] 54.1 kg (09/04 0400)  Weight change: -2 kg Filed Weights   01/14/24 1230 01/15/24 0920 01/16/24 0400  Weight: 54.8 kg 54.8 kg 54.1 kg    Intake/Output: I/O last 3 completed shifts: In: 810 [P.O.:110; IV Piggyback:700] Out: 2000 [Other:2000]   Intake/Output this shift:  Total I/O In: 743 [P.O.:393; IV Piggyback:350] Out: 200 [Urine:200]  Physical Exam: General: NAD  Head: Normocephalic, atraumatic. Moist oral mucosal membranes  Eyes: Anicteric  Lungs:  Clear to auscultation, room air  Heart: Regular rate and rhythm  Abdomen:  Soft, nontender  Extremities:  No peripheral edema.  Neurologic: Awake, alert, conversant  Skin: Warm,dry, no rash  Access: Lt AVF    Basic Metabolic Panel: Recent Labs  Lab 01/13/24 2259 01/14/24 0452 01/15/24 0327 01/16/24 0326  NA 136 135 135 138  K 5.3* 4.5 4.7 4.3  CL 101 102 96* 98  CO2 24 24 27 30   GLUCOSE 84 152* 113* 83   BUN 86* 85* 45* 35*  CREATININE 4.77* 4.60* 3.18* 2.86*  CALCIUM  9.1 8.6* 8.8* 9.1  MG  --   --  2.0 2.1  PHOS  --   --  4.1 3.5    Liver Function Tests: Recent Labs  Lab 01/13/24 2259  AST 38  ALT 58*  ALKPHOS 113  BILITOT 0.9  PROT 6.8  ALBUMIN  3.2*   No results for input(s): LIPASE, AMYLASE in the last 168 hours. No results for input(s): AMMONIA in the last 168 hours.  CBC: Recent Labs  Lab 01/13/24 2259 01/14/24 0452 01/15/24 0327 01/16/24 0326  WBC 5.9 5.0 4.0 5.1  NEUTROABS 4.0  --   --   --   HGB 10.9* 9.6* 9.4* 9.8*  HCT 35.1* 30.0* 29.4* 31.1*  MCV 102.0* 100.7* 99.3 100.0  PLT 139* 120* 128* 145*    Cardiac Enzymes: No results for input(s): CKTOTAL, CKMB, CKMBINDEX, TROPONINI in the last 168 hours.  BNP: Invalid input(s): POCBNP  CBG: Recent Labs  Lab 01/15/24 1421 01/15/24 1723 01/15/24 2106 01/16/24 0727 01/16/24 1114  GLUCAP 141* 138* 167* 98 157*    Microbiology: Results for orders placed or performed during the hospital encounter of 01/13/24  Urine Culture     Status: Abnormal   Collection Time: 01/13/24 11:07 PM   Specimen: Urine, Random  Result Value Ref Range Status   Specimen Description   Final    URINE, RANDOM Performed at [facility], [address], [place], [place] [address]  Special Requests   Final    NONE Reflexed from [id number] Performed at [facility], [address] [place], [place] [address]    Culture >=100,000 COLONIES/mL ESCHERICHIA COLI (A)  Final   Report Status 01/16/2024 FINAL  Final   Organism ID, Bacteria ESCHERICHIA COLI (A)  Final      Susceptibility   Escherichia coli - MIC*    AMPICILLIN  <=2 SENSITIVE Sensitive     CEFAZOLIN  (URINE) Value in next row Sensitive      <=1 SENSITIVEThis is a modified FDA-approved test that has been validated and its performance characteristics determined by the reporting laboratory.  This laboratory is certified under the Clinical  Laboratory Improvement Amendments CLIA as qualified to perform high complexity clinical laboratory testing.    CEFEPIME  Value in next row Sensitive      <=1 SENSITIVEThis is a modified FDA-approved test that has been validated and its performance characteristics determined by the reporting laboratory.  This laboratory is certified under the Clinical Laboratory Improvement Amendments CLIA as qualified to perform high complexity clinical laboratory testing.    ERTAPENEM Value in next row Sensitive      <=1 SENSITIVEThis is a modified FDA-approved test that has been validated and its performance characteristics determined by the reporting laboratory.  This laboratory is certified under the Clinical Laboratory Improvement Amendments CLIA as qualified to perform high complexity clinical laboratory testing.    CEFTRIAXONE  Value in next row Sensitive      <=1 SENSITIVEThis is a modified FDA-approved test that has been validated and its performance characteristics determined by the reporting laboratory.  This laboratory is certified under the Clinical Laboratory Improvement Amendments CLIA as qualified to perform high complexity clinical laboratory testing.    CIPROFLOXACIN Value in next row Intermediate      <=1 SENSITIVEThis is a modified FDA-approved test that has been validated and its performance characteristics determined by the reporting laboratory.  This laboratory is certified under the Clinical Laboratory Improvement Amendments CLIA as qualified to perform high complexity clinical laboratory testing.    GENTAMICIN Value in next row Sensitive      <=1 SENSITIVEThis is a modified FDA-approved test that has been validated and its performance characteristics determined by the reporting laboratory.  This laboratory is certified under the Clinical Laboratory Improvement Amendments CLIA as qualified to perform high complexity clinical laboratory testing.    NITROFURANTOIN Value in next row Sensitive      <=1  SENSITIVEThis is a modified FDA-approved test that has been validated and its performance characteristics determined by the reporting laboratory.  This laboratory is certified under the Clinical Laboratory Improvement Amendments CLIA as qualified to perform high complexity clinical laboratory testing.    TRIMETH/SULFA Value in next row Sensitive      <=1 SENSITIVEThis is a modified FDA-approved test that has been validated and its performance characteristics determined by the reporting laboratory.  This laboratory is certified under the Clinical Laboratory Improvement Amendments CLIA as qualified to perform high complexity clinical laboratory testing.    AMPICILLIN /SULBACTAM Value in next row Sensitive      <=1 SENSITIVEThis is a modified FDA-approved test that has been validated and its performance characteristics determined by the reporting laboratory.  This laboratory is certified under the Clinical Laboratory Improvement Amendments CLIA as qualified to perform high complexity clinical laboratory testing.    PIP/TAZO Value in next row Sensitive ug/mL     <=4 SENSITIVEThis is a modified FDA-approved test that has been validated and its performance characteristics  determined by the reporting laboratory.  This laboratory is certified under the Clinical Laboratory Improvement Amendments CLIA as qualified to perform high complexity clinical laboratory testing.    MEROPENEM Value in next row Sensitive      <=4 SENSITIVEThis is a modified FDA-approved test that has been validated and its performance characteristics determined by the reporting laboratory.  This laboratory is certified under the Clinical Laboratory Improvement Amendments CLIA as qualified to perform high complexity clinical laboratory testing.    * >=100,000 COLONIES/mL ESCHERICHIA COLI  Blood culture (single)     Status: None (Preliminary result)   Collection Time: 01/13/24 11:32 PM   Specimen: BLOOD  Result Value Ref Range Status    Specimen Description BLOOD BLOOD RIGHT ARM  Final   Special Requests   Final    BOTTLES DRAWN AEROBIC AND ANAEROBIC Blood Culture adequate volume   Culture   Final    NO GROWTH 3 DAYS Performed at [facility], [address] [place], [place] [address]    Report Status PENDING  Incomplete    Coagulation Studies: Recent Labs    01/14/24 0452  LABPROT 14.2  INR 1.0    Urinalysis: Recent Labs    01/13/24 2307  COLORURINE AMBER*  LABSPEC 1.012  PHURINE 6.0  GLUCOSEU NEGATIVE  HGBUR NEGATIVE  BILIRUBINUR NEGATIVE  KETONESUR NEGATIVE  PROTEINUR 100*  NITRITE NEGATIVE  LEUKOCYTESUR LARGE*      Imaging: ECHOCARDIOGRAM LIMITED Result Date: 01/15/2024    ECHOCARDIOGRAM LIMITED REPORT   Patient Name:   [facility] Date of Exam: 01/14/2024 Medical Rec #:  [id number]    Height:       66.0 in Accession #:    [id number]   Weight:       120.8 lb Date of Birth:  [birth year]   BSA:          1.614 m Patient Age:    67 years     BP:           160/62 mmHg Patient Gender: F            HR:           92 bpm. Exam Location:  [facility] Procedure: 2D Echo, Limited Echo, 3D Echo, Limited Color Doppler, Cardiac            Doppler and Strain Analysis (Both Spectral and Color Flow Doppler            were utilized during procedure). Indications:     I35.0 Aortic Stenosis  History:         Patient has prior history of Echocardiogram examinations, most                  recent 06/20/2023. COPD; Risk Factors:Diabetes, Former Smoker and                  Hypertension. End Stage Renal Disease-Hemodialysis.  Sonographer:     Carl Coma RDCS Referring Phys:  [id number] ELVAN SOR Diagnosing Phys: Lonni Hanson MD IMPRESSIONS  1. Left ventricular ejection fraction, by estimation, is 60 to 65%. Left ventricular ejection fraction by 3D volume is 52 %. The left ventricle has normal function. There is moderate left ventricular hypertrophy. Left ventricular diastolic parameters are indeterminate. The average  left ventricular global longitudinal strain is -20.0 %. The global longitudinal strain is normal.  2. Right ventricular systolic function is normal. The right ventricular size is normal. Mildly increased right ventricular wall thickness. There is severely elevated pulmonary  artery systolic pressure. The estimated right ventricular systolic pressure is 69.5 mmHg.  3. Trivial mitral valve regurgitation. Moderate mitral stenosis; transvalvular gradient likely accentuated by high-flow state. The mean mitral valve gradient is 10.0 mmHg.  4. Tricuspid valve regurgitation is mild to moderate.  5. The aortic valve has an indeterminant number of cusps. There is moderate calcification of the aortic valve. There is severe thickening of the aortic valve. Aortic valve regurgitation is moderate. Severe aortic valve stenosis. Aortic valve area, by VTI measures 0.71 cm. Aortic valve mean gradient measures 47.0 mmHg.  6. The inferior vena cava is normal in size with <50% respiratory variability, suggesting right atrial pressure of 8 mmHg. FINDINGS  Left Ventricle: Left ventricular ejection fraction, by estimation, is 60 to 65%. Left ventricular ejection fraction by 3D volume is 52 %. The left ventricle has normal function. The average left ventricular global longitudinal strain is -20.0 %. Strain was performed and the global longitudinal strain is normal. The left ventricular internal cavity size was normal in size. There is moderate left ventricular hypertrophy. Left ventricular diastolic parameters are indeterminate. Right Ventricle: The right ventricular size is normal. Mildly increased right ventricular wall thickness. Right ventricular systolic function is normal. There is severely elevated pulmonary artery systolic pressure. The tricuspid regurgitant velocity is 3.92 m/s, and with an assumed right atrial pressure of 8 mmHg, the estimated right ventricular systolic pressure is 69.5 mmHg. Pericardium: Trivial pericardial  effusion is present. There is moderate thickening of the mitral valve leaflet(s). Trivial mitral valve regurgitation. Moderate mitral valve stenosis. Tricuspid Valve: The tricuspid valve is normal in structure. Tricuspid valve regurgitation is mild to moderate. The aortic valve has an indeterminant number of cusps. There is moderate calcification of the aortic valve. There is severe thickening of the aortic valve. Aortic valve regurgitation is moderate. Severe aortic stenosis is present. Aorta: The aortic root is normal in size and structure. Venous: The inferior vena cava is normal in size with less than 50% respiratory variability, suggesting right atrial pressure of 8 mmHg. Additional Comments: 3D was performed not requiring image post processing on an independent workstation and was normal.  LEFT VENTRICLE PLAX 2D LVIDd:         3.90 cm         Diastology LVIDs:         2.80 cm         LV e' medial:    5.22 cm/s LV PW:         1.50 cm         LV E/e' medial:  26.1 LV IVS:        1.40 cm         LV e' lateral:   5.44 cm/s LVOT diam:     1.80 cm         LV E/e' lateral: 25.1 LV SV:         60 LV SV Index:   37              2D Longitudinal LVOT Area:     2.54 cm        Strain                                2D Strain GLS   -19.1 %                                (  A4C):                                2D Strain GLS   -20.7 %                                (A3C):                                2D Strain GLS   -20.2 %                                (A2C):                                2D Strain GLS   -20.0 %                                Avg:                                 3D Volume EF                                LV 3D EF:    Left                                             ventricul                                             ar                                             ejection                                             fraction                                             by 3D                                              volume is  52 %.                                 3D Volume EF:                                3D EF:        52 %                                LV EDV:       149 ml                                LV ESV:       72 ml                                LV SV:        78 ml RIGHT VENTRICLE             IVC RV S prime:     14.00 cm/s  IVC diam: 1.10 cm TAPSE (M-mode): 2.8 cm LEFT ATRIUM         Index LA diam:    4.60 cm 2.85 cm/m  AORTIC VALVE AV Area (Vmax):    0.76 cm AV Area (Vmean):   0.67 cm AV Area (VTI):     0.71 cm AV Vmax:           406.60 cm/s AV Vmean:          314.200 cm/s AV VTI:            0.851 m AV Peak Grad:      66.1 mmHg AV Mean Grad:      47.0 mmHg LVOT Vmax:         121.00 cm/s LVOT Vmean:        83.267 cm/s LVOT VTI:          0.237 m LVOT/AV VTI ratio: 0.28 AI PHT:            322 msec  AORTA Ao Root diam: 3.20 cm MITRAL VALVE                TRICUSPID VALVE MV Area (PHT): 4.10 cm     TR Peak grad:   61.5 mmHg MV Area VTI:   1.46 cm     TR Vmax:        392.00 cm/s MV Peak grad:  19.9 mmHg MV Mean grad:  10.0 mmHg    SHUNTS MV Vmax:       2.23 m/s     Systemic VTI:  0.24 m MV Vmean:      148.0 cm/s   Systemic Diam: 1.80 cm MV Decel Time: 185 msec MV E velocity: 136.50 cm/s MV A velocity: 203.00 cm/s MV E/A ratio:  0.67 Lonni End MD Electronically signed by Lonni Hanson MD Signature Date/Time: 01/15/2024/7:12:29 AM    Final      Medications:    azithromycin  Stopped (01/16/24 0007)   cefTRIAXone  (ROCEPHIN )  IV Stopped (01/15/24 2218)    aspirin  EC  81 mg Oral Daily   bisacodyl   10 mg Oral QHS   budesonide  (PULMICORT ) nebulizer solution  0.5 mg  Nebulization BID   calcitRIOL   0.25 mcg Oral Daily   Chlorhexidine  Gluconate Cloth  6 each Topical Q0600   DULoxetine   30 mg Oral Daily   epoetin  alfa-epbx (RETACRIT ) injection  4,000 Units Intravenous Q M,W,F-1800   famotidine   10 mg Oral Daily   feeding supplement (NEPRO CARB STEADY)   237 mL Oral BID BM   fluticasone  furoate-vilanterol  1 puff Inhalation Daily   guaiFENesin   600 mg Oral BID   heparin  injection (subcutaneous)  5,000 Units Subcutaneous Q8H   insulin  aspart  0-9 Units Subcutaneous TID WC   ipratropium-albuterol   3 mL Nebulization BID   melatonin  2.5 mg Oral QHS   methylPREDNISolone  (SOLU-MEDROL ) injection  10 mg Intravenous Q12H   midodrine   5 mg Oral Q M,W,F-HD   pantoprazole   40 mg Oral Daily   polyethylene glycol  17 g Oral BID   traZODone   75 mg Oral QHS   acetaminophen  **OR** acetaminophen , calcium  carbonate, chlorpheniramine-HYDROcodone , diphenhydrAMINE , magnesium  hydroxide, metoprolol  tartrate, ondansetron  **OR** ondansetron  (ZOFRAN ) IV, oxyCODONE , topiramate , traZODone   Assessment/ Plan:  Ms. Reena Borromeo is a 68 y.o.  female with past medical history of ESRD with hemodialysis, diabetes mellitus, and hypertension. Patient presents to ED with fall and syncopal. Patient will be admitted for Community acquired pneumonia [J18.9] Acute UTI [N39.0] Aortic stenosis, severe [I35.0] Injury of head, initial encounter [S09.90XA] Syncope, unspecified syncope type [R55] Chest pain, unspecified type [R07.9]  [facility] [place]/MWF/Lt AVF  End stage renal disease on hemodialysis. Patietn received dialysis two days in a row, UF total 4L. Next treatment scheduled for Friday.   2. Pneumonia, sepsis workup in progress. CT chest concerning for left lower lobe pneumonia with collapse. Primary team has ordered IV Rocephin  and Azithromycin .   3. Anemia of chronic kidney disease Lab Results  Component Value Date   HGB 9.8 (L) 01/16/2024    Continue low dose Retacrit  4000 units with dialysis.   4. Secondary Hyperparathyroidism: with outpatient labs: PTH 507, phosphorus 3.9, calcium  8.5 on 12/23/23.   Lab Results  Component Value Date   PTH 70 (H) 05/12/2020   CALCIUM  9.1 01/16/2024   CAION 1.13 (L) 08/15/2023   PHOS 3.5 01/16/2024    Will continue to  monitor bone minerals during this admission.   5. 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 Most recent hemoglobin A1c is 4.7 on 09/23/21.   Glucose well controlled. Primary team to manage SSI   LOS: 2 Delano Scardino 9/4/202511:20 AM

## 2024-01-18 LAB — CULTURE, BLOOD (SINGLE)
Culture: NO GROWTH
Special Requests: ADEQUATE

## 2024-01-20 ENCOUNTER — Ambulatory Visit: Admitting: Physician Assistant

## 2024-01-21 ENCOUNTER — Ambulatory Visit: Admitting: Medical

## 2024-01-23 ENCOUNTER — Encounter: Admission: RE | Payer: Self-pay | Source: Home / Self Care

## 2024-01-23 ENCOUNTER — Ambulatory Visit: Admission: RE | Admit: 2024-01-23 | Source: Home / Self Care | Admitting: Vascular Surgery

## 2024-01-23 DIAGNOSIS — N186 End stage renal disease: Secondary | ICD-10-CM

## 2024-01-23 SURGERY — A/V FISTULAGRAM
Anesthesia: Moderate Sedation | Laterality: Left

## 2024-01-23 MED ORDER — SODIUM CHLORIDE 0.9 % IV SOLN: INTRAVENOUS | Status: DC

## 2024-01-23 MED ORDER — CEFAZOLIN SODIUM-DEXTROSE 1-4 GM/50ML-% IV SOLN: 1.0000 g | INTRAVENOUS | Status: DC

## 2024-01-23 MED ORDER — DIPHENHYDRAMINE HCL 50 MG/ML IJ SOLN: 50.0000 mg | Freq: Once | INTRAMUSCULAR | Status: DC | PRN

## 2024-01-23 MED ORDER — ONDANSETRON HCL 4 MG/2ML IJ SOLN: 4.0000 mg | Freq: Four times a day (QID) | INTRAMUSCULAR | Status: DC | PRN

## 2024-01-23 MED ORDER — MIDAZOLAM HCL 2 MG/ML PO SYRP: 8.0000 mg | ORAL_SOLUTION | Freq: Once | ORAL | Status: DC | PRN

## 2024-01-23 MED ORDER — METHYLPREDNISOLONE SODIUM SUCC 125 MG IJ SOLR: 125.0000 mg | Freq: Once | INTRAMUSCULAR | Status: DC | PRN

## 2024-01-23 MED ORDER — FAMOTIDINE 20 MG PO TABS: 40.0000 mg | ORAL_TABLET | Freq: Once | ORAL | Status: DC | PRN

## 2024-01-23 MED ORDER — HYDROMORPHONE HCL 1 MG/ML IJ SOLN: 1.0000 mg | Freq: Once | INTRAMUSCULAR | Status: DC | PRN

## 2024-01-24 ENCOUNTER — Encounter: Payer: Self-pay | Admitting: Intensive Care

## 2024-01-24 ENCOUNTER — Emergency Department
Admission: EM | Admit: 2024-01-24 | Discharge: 2024-01-25 | Disposition: A | Discharge status: Home / Self Care | Attending: Emergency Medicine | Admitting: Emergency Medicine

## 2024-01-24 ENCOUNTER — Other Ambulatory Visit: Payer: Self-pay

## 2024-01-24 DIAGNOSIS — N186 End stage renal disease: Secondary | ICD-10-CM | POA: Diagnosis not present

## 2024-01-24 DIAGNOSIS — R410 Disorientation, unspecified: Secondary | ICD-10-CM | POA: Diagnosis not present

## 2024-01-24 DIAGNOSIS — R531 Weakness: Secondary | ICD-10-CM | POA: Insufficient documentation

## 2024-01-24 DIAGNOSIS — J449 Chronic obstructive pulmonary disease, unspecified: Secondary | ICD-10-CM | POA: Diagnosis not present

## 2024-01-24 DIAGNOSIS — Z992 Dependence on renal dialysis: Secondary | ICD-10-CM | POA: Diagnosis not present

## 2024-01-24 DIAGNOSIS — I5030 Unspecified diastolic (congestive) heart failure: Secondary | ICD-10-CM | POA: Insufficient documentation

## 2024-01-24 DIAGNOSIS — I350 Nonrheumatic aortic (valve) stenosis: Secondary | ICD-10-CM | POA: Diagnosis not present

## 2024-01-24 DIAGNOSIS — R1110 Vomiting, unspecified: Secondary | ICD-10-CM | POA: Diagnosis present

## 2024-01-24 LAB — CBC
HCT: 35.3 % — ABNORMAL LOW (ref 36.0–46.0)
Hemoglobin: 10.9 g/dL — ABNORMAL LOW (ref 12.0–15.0)
MCH: 31.9 pg (ref 26.0–34.0)
MCHC: 30.9 g/dL (ref 30.0–36.0)
MCV: 103.2 fL — ABNORMAL HIGH (ref 80.0–100.0)
Platelets: 165 K/uL (ref 150–400)
RBC: 3.42 MIL/uL — ABNORMAL LOW (ref 3.87–5.11)
RDW: 13.1 % (ref 11.5–15.5)
WBC: 7.9 K/uL (ref 4.0–10.5)
nRBC: 0 % (ref 0.0–0.2)

## 2024-01-24 LAB — TROPONIN I (HIGH SENSITIVITY)
Troponin I (High Sensitivity): 41 ng/L — ABNORMAL HIGH (ref <18)
Troponin I (High Sensitivity): 45 ng/L — ABNORMAL HIGH (ref <18)

## 2024-01-24 LAB — COMPREHENSIVE METABOLIC PANEL WITH GFR
ALT: 85 U/L — ABNORMAL HIGH (ref 0–44)
AST: 65 U/L — ABNORMAL HIGH (ref 15–41)
Albumin: 3 g/dL — ABNORMAL LOW (ref 3.5–5.0)
Alkaline Phosphatase: 119 U/L (ref 38–126)
Anion gap: 11 (ref 5–15)
BUN: 35 mg/dL — ABNORMAL HIGH (ref 8–23)
CO2: 31 mmol/L (ref 22–32)
Calcium: 8.4 mg/dL — ABNORMAL LOW (ref 8.9–10.3)
Chloride: 94 mmol/L — ABNORMAL LOW (ref 98–111)
Creatinine, Ser: 1.72 mg/dL — ABNORMAL HIGH (ref 0.44–1.00)
GFR, Estimated: 33 mL/min — ABNORMAL LOW (ref >60)
Glucose, Bld: 78 mg/dL (ref 70–99)
Potassium: 3.7 mmol/L (ref 3.5–5.1)
Sodium: 136 mmol/L (ref 135–145)
Total Bilirubin: 0.9 mg/dL (ref 0.0–1.2)
Total Protein: 6.5 g/dL (ref 6.5–8.1)

## 2024-01-24 LAB — RESP PANEL BY RT-PCR (RSV, FLU A&B, COVID)  RVPGX2
Influenza A by PCR: NEGATIVE
Influenza B by PCR: NEGATIVE
Resp Syncytial Virus by PCR: NEGATIVE
SARS Coronavirus 2 by RT PCR: NEGATIVE

## 2024-01-24 LAB — CBG MONITORING, ED: Glucose-Capillary: 176 mg/dL — ABNORMAL HIGH (ref 70–99)

## 2024-01-24 NOTE — ED Notes (Signed)
 This tech went in pt room to update vitals. Pt verbally aggressive and threatening to leave right now and walk home due to hunger and frustration with having to wait for transport and nobody coming to check on me, according to pt. This tech de-escalated pt and listened to pt concerns. Pt calm and cooperative after treated empathetically. Pt given graham cracker packs to snack on in the meantime. Pt stated, I did not like that one lady, she needs to learn how to talk to people, when referring to the nurse. This tech redirected pt from this conversation. No further requests from pt at this time.

## 2024-01-24 NOTE — ED Triage Notes (Signed)
 Patient arrived by [facility] from dialysis. Reported removed. Patient reported she feels unwell. C/o hurting all over, emesis and weakness before dialysis treatment.    Patient lives at [facility]

## 2024-01-24 NOTE — ED Provider Notes (Signed)
 [facility] Provider Note    Event Date/Time   First MD Initiated Contact with Patient 01/24/24 1656     (approximate)   History   Weakness and Emesis   HPI  Katie [name] is a 68 y.o. female who presents to the ED for evaluation of Weakness and Emesis   I reviewed a medical DC summary from 8 days ago.  History of COPD and ESRD on hemodialysis, severe aortic stenosis, diastolic CHF admitted for syncope and fall.  Found to have sepsis from pneumonia and UTI.  Upcoming plans for TAVR, scheduled to undergo this in the past couple weeks but missed the procedure due to transportation issues.  Patient presents to the ED from dialysis after feeling weird after I finished, I do not know what I was feeling.  She is difficulty describing her symptoms she reports transient sensation of confusion that has resolved without coexisting additional features.  No falls, dizziness or weakness today particular extremities.  She reports she is hungry and asking for something to eat and feels better on arrival to the ED.   Physical Exam   Triage Vital Signs: ED Triage Vitals  Encounter Vitals Group     BP 01/24/24 1625 (!) 115/46     Girls Systolic BP Percentile --      Girls Diastolic BP Percentile --      Boys Systolic BP Percentile --      Boys Diastolic BP Percentile --      Pulse Rate 01/24/24 1625 92     Resp 01/24/24 1625 16     Temp 01/24/24 1625 97.7 F (36.5 C)     Temp Source 01/24/24 1625 Oral     SpO2 01/24/24 1625 98 %     Weight 01/24/24 1626 119 lb 4.3 oz (54.1 kg)     Height 01/24/24 1626 5' 6 (1.676 m)     Head Circumference --      Peak Flow --      Pain Score 01/24/24 1626 8     Pain Loc --      Pain Education --      Exclude from Growth Chart --     Most recent vital signs: Vitals:   01/24/24 2117 01/24/24 2130  BP:  (!) 113/55  Pulse:  86  Resp: 18   Temp: 99.1 F (37.3 C)   SpO2:  99%    General: Awake, no distress.  CV:  Good  peripheral perfusion.  Resp:  Normal effort.  Abd:  No distention.  MSK:  No deformity noted.  Neuro:  No focal deficits appreciated. Other:     ED Results / Procedures / Treatments   Labs (all labs ordered are listed, but only abnormal results are displayed) Labs Reviewed  COMPREHENSIVE METABOLIC PANEL WITH GFR - Abnormal; Notable for the following components:      Result Value   Chloride 94 (*)    BUN 35 (*)    Creatinine, Ser 1.72 (*)    Calcium  8.4 (*)    Albumin  3.0 (*)    AST 65 (*)    ALT 85 (*)    GFR, Estimated 33 (*)    All other components within normal limits  CBC - Abnormal; Notable for the following components:   RBC 3.42 (*)    Hemoglobin 10.9 (*)    HCT 35.3 (*)    MCV 103.2 (*)    All other components within normal limits  CBG MONITORING, ED -  Abnormal; Notable for the following components:   Glucose-Capillary 176 (*)    All other components within normal limits  TROPONIN I (HIGH SENSITIVITY) - Abnormal; Notable for the following components:   Troponin I (High Sensitivity) 41 (*)    All other components within normal limits  TROPONIN I (HIGH SENSITIVITY) - Abnormal; Notable for the following components:   Troponin I (High Sensitivity) 45 (*)    All other components within normal limits  RESP PANEL BY RT-PCR (RSV, FLU A&B, COVID)  [id number]  URINALYSIS, ROUTINE W REFLEX MICROSCOPIC    EKG Sinus rhythm with a rate of 81 bpm.  Normal axis.  No clear signs of acute ischemia.  Nonspecific ST changes to V3  RADIOLOGY   Official radiology report(s): No results found.  PROCEDURES and INTERVENTIONS:  .1-3 Lead EKG Interpretation  Performed by: Claudene Rover, MD Authorized by: Claudene Rover, MD     Interpretation: normal     ECG rate:  80   ECG rate assessment: normal     Rhythm: sinus rhythm     Ectopy: none     Conduction: normal     Medications - No data to display   IMPRESSION / MDM / ASSESSMENT AND PLAN / ED COURSE  I reviewed the triage  vital signs and the nursing notes.  Differential diagnosis includes, but is not limited to, TIA, hypokalemia, viral syndrome, acute stress reaction  {Patient presents with symptoms of an acute illness or injury that is potentially life-threatening.  Patient presents after dialysis with vague symptoms of confusion or weakness that self resolved, have not recurred and she has a benign workup and ultimately suitable for trial of outpatient management.  Looks well to me with a normal exam and only reporting feeling hungry.  Had she eats a full meal tray without abdominal pain or emesis and has a reassuring workup.  Chronically elevated and flat troponins without chest pain, negative viral swab, no significant metabolic derangements, no leukocytosis.  I considered admission for this patient  Clinical Course as of 01/24/24 2229  Fri Jan 24, 2024  1727 Feeling strange after dialysis but feels much better now, asking for food.  No particular symptoms [DS]  2135 Reassessed, patient ate a full meal tray and reports feeling better.  No recurrence of her presenting symptoms.  We discussed generally reassuring workup and possible etiologies of her symptoms.  She is comfortable going home which I think is reasonable.  We discussed close return precautions. [DS]    Clinical Course User Index [DS] Claudene Rover, MD     FINAL CLINICAL IMPRESSION(S) / ED DIAGNOSES   Final diagnoses:  Generalized weakness  ESRD (end stage renal disease) (HCC)     Rx / DC Orders   ED Discharge Orders     None        Note:  This document was prepared using Dragon voice recognition software and may include unintentional dictation errors.   Claudene Rover, MD 01/24/24 2231

## 2024-01-25 NOTE — ED Notes (Signed)
 Patient denies pain and is resting comfortably.

## 2024-01-31 ENCOUNTER — Other Ambulatory Visit: Payer: Self-pay

## 2024-01-31 ENCOUNTER — Observation Stay

## 2024-01-31 ENCOUNTER — Emergency Department

## 2024-01-31 ENCOUNTER — Observation Stay
Admission: EM | Admit: 2024-01-31 | Discharge: 2024-02-04 | DRG: 811 | Disposition: A | Discharge status: Home Health | Source: Ambulatory Visit | Attending: Emergency Medicine | Admitting: Emergency Medicine

## 2024-01-31 DIAGNOSIS — R54 Age-related physical debility: Secondary | ICD-10-CM | POA: Diagnosis present

## 2024-01-31 DIAGNOSIS — D631 Anemia in chronic kidney disease: Secondary | ICD-10-CM | POA: Diagnosis present

## 2024-01-31 DIAGNOSIS — I5032 Chronic diastolic (congestive) heart failure: Secondary | ICD-10-CM | POA: Diagnosis present

## 2024-01-31 DIAGNOSIS — Z7982 Long term (current) use of aspirin: Secondary | ICD-10-CM

## 2024-01-31 DIAGNOSIS — S0631AA Contusion and laceration of right cerebrum with loss of consciousness status unknown, initial encounter: Secondary | ICD-10-CM

## 2024-01-31 DIAGNOSIS — J449 Chronic obstructive pulmonary disease, unspecified: Secondary | ICD-10-CM | POA: Diagnosis present

## 2024-01-31 DIAGNOSIS — Z8049 Family history of malignant neoplasm of other genital organs: Secondary | ICD-10-CM

## 2024-01-31 DIAGNOSIS — Z820 Family history of epilepsy and other diseases of the nervous system: Secondary | ICD-10-CM

## 2024-01-31 DIAGNOSIS — I6200 Nontraumatic subdural hemorrhage, unspecified: Secondary | ICD-10-CM | POA: Diagnosis present

## 2024-01-31 DIAGNOSIS — Z808 Family history of malignant neoplasm of other organs or systems: Secondary | ICD-10-CM

## 2024-01-31 DIAGNOSIS — S062X0A Diffuse traumatic brain injury without loss of consciousness, initial encounter: Secondary | ICD-10-CM | POA: Diagnosis not present

## 2024-01-31 DIAGNOSIS — Z880 Allergy status to penicillin: Secondary | ICD-10-CM

## 2024-01-31 DIAGNOSIS — Z87820 Personal history of traumatic brain injury: Secondary | ICD-10-CM

## 2024-01-31 DIAGNOSIS — I615 Nontraumatic intracerebral hemorrhage, intraventricular: Secondary | ICD-10-CM | POA: Diagnosis not present

## 2024-01-31 DIAGNOSIS — Z8042 Family history of malignant neoplasm of prostate: Secondary | ICD-10-CM

## 2024-01-31 DIAGNOSIS — Z801 Family history of malignant neoplasm of trachea, bronchus and lung: Secondary | ICD-10-CM

## 2024-01-31 DIAGNOSIS — Z66 Do not resuscitate: Secondary | ICD-10-CM | POA: Diagnosis present

## 2024-01-31 DIAGNOSIS — E8809 Other disorders of plasma-protein metabolism, not elsewhere classified: Secondary | ICD-10-CM | POA: Diagnosis present

## 2024-01-31 DIAGNOSIS — S0633AA Contusion and laceration of cerebrum, unspecified, with loss of consciousness status unknown, initial encounter: Secondary | ICD-10-CM

## 2024-01-31 DIAGNOSIS — N2581 Secondary hyperparathyroidism of renal origin: Secondary | ICD-10-CM | POA: Diagnosis present

## 2024-01-31 DIAGNOSIS — E1122 Type 2 diabetes mellitus with diabetic chronic kidney disease: Secondary | ICD-10-CM | POA: Diagnosis present

## 2024-01-31 DIAGNOSIS — Z8249 Family history of ischemic heart disease and other diseases of the circulatory system: Secondary | ICD-10-CM

## 2024-01-31 DIAGNOSIS — Z825 Family history of asthma and other chronic lower respiratory diseases: Secondary | ICD-10-CM

## 2024-01-31 DIAGNOSIS — E875 Hyperkalemia: Secondary | ICD-10-CM | POA: Diagnosis not present

## 2024-01-31 DIAGNOSIS — N186 End stage renal disease: Secondary | ICD-10-CM | POA: Diagnosis present

## 2024-01-31 DIAGNOSIS — D696 Thrombocytopenia, unspecified: Secondary | ICD-10-CM | POA: Diagnosis present

## 2024-01-31 DIAGNOSIS — Z87891 Personal history of nicotine dependence: Secondary | ICD-10-CM

## 2024-01-31 DIAGNOSIS — I132 Hypertensive heart and chronic kidney disease with heart failure and with stage 5 chronic kidney disease, or end stage renal disease: Secondary | ICD-10-CM | POA: Diagnosis present

## 2024-01-31 DIAGNOSIS — I350 Nonrheumatic aortic (valve) stenosis: Secondary | ICD-10-CM | POA: Diagnosis present

## 2024-01-31 DIAGNOSIS — R4182 Altered mental status, unspecified: Secondary | ICD-10-CM | POA: Diagnosis not present

## 2024-01-31 DIAGNOSIS — K219 Gastro-esophageal reflux disease without esophagitis: Secondary | ICD-10-CM | POA: Diagnosis present

## 2024-01-31 DIAGNOSIS — Z992 Dependence on renal dialysis: Principal | ICD-10-CM

## 2024-01-31 LAB — URINALYSIS, W/ REFLEX TO CULTURE (INFECTION SUSPECTED)
Bacteria, UA: NONE SEEN
Bilirubin Urine: NEGATIVE
Glucose, UA: NEGATIVE mg/dL
Hgb urine dipstick: NEGATIVE
Ketones, ur: NEGATIVE mg/dL
Nitrite: NEGATIVE
Protein, ur: 100 mg/dL — AB
Specific Gravity, Urine: 1.014 (ref 1.005–1.030)
pH: 6 (ref 5.0–8.0)

## 2024-01-31 LAB — CBC WITH DIFFERENTIAL/PLATELET
Abs Immature Granulocytes: 0.04 K/uL (ref 0.00–0.07)
Basophils Absolute: 0 K/uL (ref 0.0–0.1)
Basophils Relative: 1 %
Eosinophils Absolute: 0 K/uL (ref 0.0–0.5)
Eosinophils Relative: 0 %
HCT: 32.8 % — ABNORMAL LOW (ref 36.0–46.0)
Hemoglobin: 10.3 g/dL — ABNORMAL LOW (ref 12.0–15.0)
Immature Granulocytes: 1 %
Lymphocytes Relative: 15 %
Lymphs Abs: 1.1 K/uL (ref 0.7–4.0)
MCH: 32.3 pg (ref 26.0–34.0)
MCHC: 31.4 g/dL (ref 30.0–36.0)
MCV: 102.8 fL — ABNORMAL HIGH (ref 80.0–100.0)
Monocytes Absolute: 0.7 K/uL (ref 0.1–1.0)
Monocytes Relative: 10 %
Neutro Abs: 5.3 K/uL (ref 1.7–7.7)
Neutrophils Relative %: 73 %
Platelets: 110 K/uL — ABNORMAL LOW (ref 150–400)
RBC: 3.19 MIL/uL — ABNORMAL LOW (ref 3.87–5.11)
RDW: 13.9 % (ref 11.5–15.5)
WBC: 7.2 K/uL (ref 4.0–10.5)
nRBC: 0 % (ref 0.0–0.2)

## 2024-01-31 LAB — BASIC METABOLIC PANEL WITH GFR
Anion gap: 14 (ref 5–15)
BUN: 93 mg/dL — ABNORMAL HIGH (ref 8–23)
CO2: 25 mmol/L (ref 22–32)
Calcium: 8.6 mg/dL — ABNORMAL LOW (ref 8.9–10.3)
Chloride: 100 mmol/L (ref 98–111)
Creatinine, Ser: 5.47 mg/dL — ABNORMAL HIGH (ref 0.44–1.00)
GFR, Estimated: 8 mL/min — ABNORMAL LOW (ref >60)
Glucose, Bld: 119 mg/dL — ABNORMAL HIGH (ref 70–99)
Potassium: 5 mmol/L (ref 3.5–5.1)
Sodium: 139 mmol/L (ref 135–145)

## 2024-01-31 MED ORDER — POLYETHYLENE GLYCOL 3350 17 G PO PACK
17.0000 g | PACK | Freq: Two times a day (BID) | ORAL | Status: DC
  Administered 2024-01-31 – 2024-02-04 (×6): 17 g via ORAL
  Filled 2024-01-31 (×7): qty 1

## 2024-01-31 MED ORDER — ONDANSETRON HCL 4 MG/2ML IJ SOLN
4.0000 mg | Freq: Four times a day (QID) | INTRAMUSCULAR | Status: DC | PRN
  Administered 2024-02-01 – 2024-02-03 (×2): 4 mg via INTRAVENOUS

## 2024-01-31 MED ORDER — CALCITRIOL 0.25 MCG PO CAPS
0.2500 ug | ORAL_CAPSULE | Freq: Every day | ORAL | Status: DC
  Administered 2024-02-01 – 2024-02-04 (×3): 0.25 ug via ORAL
  Filled 2024-01-31 (×5): qty 1

## 2024-01-31 MED ORDER — LABETALOL HCL 5 MG/ML IV SOLN: 10.0000 mg | INTRAVENOUS | Status: DC | PRN

## 2024-01-31 MED ORDER — ONDANSETRON HCL 4 MG PO TABS: 4.0000 mg | ORAL_TABLET | Freq: Four times a day (QID) | ORAL | Status: DC | PRN

## 2024-01-31 MED ORDER — TRAZODONE HCL 50 MG PO TABS
75.0000 mg | ORAL_TABLET | Freq: Every day | ORAL | Status: DC
  Administered 2024-01-31 – 2024-02-03 (×4): 75 mg via ORAL
  Filled 2024-01-31 (×4): qty 2

## 2024-01-31 MED ORDER — BISACODYL 5 MG PO TBEC
10.0000 mg | DELAYED_RELEASE_TABLET | Freq: Every day | ORAL | Status: DC
  Administered 2024-01-31 – 2024-02-03 (×4): 10 mg via ORAL
  Filled 2024-01-31 (×4): qty 2

## 2024-01-31 MED ORDER — FLUTICASONE FUROATE-VILANTEROL 200-25 MCG/ACT IN AEPB
1.0000 | INHALATION_SPRAY | Freq: Every day | RESPIRATORY_TRACT | Status: DC
Start: 2024-01-31 — End: 2024-02-04
  Administered 2024-02-02 – 2024-02-04 (×2): 1 via RESPIRATORY_TRACT
  Filled 2024-01-31: qty 28

## 2024-01-31 MED ORDER — HYDROMORPHONE HCL 1 MG/ML IJ SOLN
0.5000 mg | INTRAMUSCULAR | Status: DC | PRN
  Administered 2024-01-31 – 2024-02-03 (×5): 0.5 mg via INTRAVENOUS
  Filled 2024-01-31 (×5): qty 0.5

## 2024-01-31 MED ORDER — METOPROLOL TARTRATE 25 MG PO TABS
12.5000 mg | ORAL_TABLET | Freq: Two times a day (BID) | ORAL | Status: DC
  Administered 2024-01-31 – 2024-02-04 (×7): 12.5 mg via ORAL
  Filled 2024-01-31 (×7): qty 1

## 2024-01-31 MED ORDER — TOPIRAMATE 25 MG PO TABS: 50.0000 mg | ORAL_TABLET | Freq: Two times a day (BID) | ORAL | Status: DC | PRN

## 2024-01-31 MED ORDER — OXYCODONE HCL 5 MG PO TABS
5.0000 mg | ORAL_TABLET | Freq: Four times a day (QID) | ORAL | Status: DC | PRN
  Administered 2024-02-04: 5 mg via ORAL
  Filled 2024-01-31: qty 1

## 2024-01-31 MED ORDER — MELATONIN 5 MG PO TABS
5.0000 mg | ORAL_TABLET | Freq: Every day | ORAL | Status: DC
Start: 2024-01-31 — End: 2024-02-04
  Administered 2024-01-31 – 2024-02-03 (×4): 5 mg via ORAL
  Filled 2024-01-31 (×4): qty 1

## 2024-01-31 MED ORDER — PANTOPRAZOLE SODIUM 40 MG PO TBEC
40.0000 mg | DELAYED_RELEASE_TABLET | Freq: Every day | ORAL | Status: DC
  Administered 2024-01-31 – 2024-02-04 (×4): 40 mg via ORAL
  Filled 2024-01-31 (×5): qty 1

## 2024-01-31 MED ORDER — METOPROLOL TARTRATE 25 MG PO TABS
25.0000 mg | ORAL_TABLET | Freq: Two times a day (BID) | ORAL | Status: DC | PRN
Start: 2024-01-31 — End: 2024-01-31

## 2024-01-31 MED ORDER — ACETAMINOPHEN 650 MG RE SUPP: 650.0000 mg | Freq: Four times a day (QID) | RECTAL | Status: DC | PRN

## 2024-01-31 MED ORDER — CEFUROXIME AXETIL 500 MG PO TABS
500.0000 mg | ORAL_TABLET | ORAL | Status: AC
  Administered 2024-01-31: 500 mg via ORAL
  Filled 2024-01-31: qty 1

## 2024-01-31 MED ORDER — DULOXETINE HCL 30 MG PO CPEP
30.0000 mg | ORAL_CAPSULE | Freq: Every day | ORAL | Status: DC
Start: 2024-01-31 — End: 2024-02-04
  Administered 2024-01-31 – 2024-02-04 (×4): 30 mg via ORAL
  Filled 2024-01-31 (×5): qty 1

## 2024-01-31 MED ORDER — FAMOTIDINE 20 MG PO TABS
10.0000 mg | ORAL_TABLET | Freq: Every day | ORAL | Status: DC
  Administered 2024-01-31 – 2024-02-03 (×4): 10 mg via ORAL
  Filled 2024-01-31 (×4): qty 1

## 2024-01-31 MED ORDER — ALBUTEROL SULFATE (2.5 MG/3ML) 0.083% IN NEBU: 2.5000 mg | INHALATION_SOLUTION | RESPIRATORY_TRACT | Status: DC | PRN

## 2024-01-31 MED ORDER — IOHEXOL 350 MG/ML SOLN
75.0000 mL | Freq: Once | INTRAVENOUS | Status: AC | PRN
  Administered 2024-01-31: 75 mL via INTRAVENOUS

## 2024-01-31 MED ORDER — ACETAMINOPHEN 325 MG PO TABS
650.0000 mg | ORAL_TABLET | Freq: Four times a day (QID) | ORAL | Status: DC | PRN
  Administered 2024-02-04: 650 mg via ORAL
  Filled 2024-01-31: qty 2

## 2024-01-31 MED ORDER — SODIUM CHLORIDE 0.9 % IV BOLUS
500.0000 mL | Freq: Once | INTRAVENOUS | Status: AC
  Administered 2024-01-31: 500 mL via INTRAVENOUS

## 2024-01-31 MED ORDER — GUAIFENESIN-DM 100-10 MG/5ML PO SYRP: 10.0000 mL | ORAL_SOLUTION | Freq: Four times a day (QID) | ORAL | Status: DC | PRN

## 2024-01-31 NOTE — Progress Notes (Signed)
 This RN entered room to assist staff transfer pt from stretcher to room bed.  The pt expressed some discomfort during the transfer.  This RN asked if it would be okay for the pt to roll from side to side, in order to retrieve the old lenin from underneath her.  The pt asked to be left alone for a little while just to get her bearings on things.  This RN complied, offering a warm blanket and darkened the room for the pt while she collected her thoughts.

## 2024-01-31 NOTE — ED Notes (Signed)
 Pt placed on CCMD

## 2024-01-31 NOTE — Consult Note (Addendum)
 Consulting Department:  Emergency department  Primary Physician:  Cordella Corning, FNP  Chief Complaint: Intraparenchymal hematoma  History of Present Illness: 01/31/2024 Katie [name] is a 68 y.o. female who presents with the chief complaint of intraparenchymal hematoma.  She was in her usual state of health until she was getting dialysis and they found her to be somnolent.  They had her come into the urgency department for evaluation.  She had recovered in that time.  She does state that she gets severe blood pressure drops during her dialysis days which happen approximately 3 days a week.  She also endorses a headache, states that it has been worsening over the past month.  Her last head trauma fall was 2 weeks ago she presented to the emergency department and had a head CT at that time which did not show any intraparenchymal bleed.  She denies any seizure-like activity.  She does have a history of concussions as a kid from sports.    Review of Systems:  A 10 point review of systems is negative, except for the pertinent positives and negatives detailed in the HPI.  Past Medical History: Past Medical History:  Diagnosis Date   Chronic diastolic heart failure (HCC)    COPD (chronic obstructive pulmonary disease) (HCC)    Diabetes mellitus without complication (HCC)    ESRD on dialysis (HCC)    Hypertension    Severe aortic stenosis     Past Surgical History: Past Surgical History:  Procedure Laterality Date   A/V SHUNT INTERVENTION Left 12/21/2021   Procedure: A/V SHUNT INTERVENTION;  Surgeon: Marea Selinda RAMAN, MD;  Location: [facility];  Service: Cardiovascular;  Laterality: Left;   APPENDECTOMY     AV FISTULA PLACEMENT Left 03/16/2021   Procedure: INSERTION OF ARTERIOVENOUS (AV) GORE-TEX GRAFT ARM;  Surgeon: Marea Selinda RAMAN, MD;  Location: [facility];  Service: Vascular;  Laterality: Left;   DIALYSIS/PERMA CATHETER INSERTION N/A 05/16/2020   Procedure: DIALYSIS/PERMA CATHETER  INSERTION;  Surgeon: Marea Selinda RAMAN, MD;  Location: [facility];  Service: Cardiovascular;  Laterality: N/A;   DIALYSIS/PERMA CATHETER REMOVAL N/A 03/26/2022   Procedure: DIALYSIS/PERMA CATHETER REMOVAL;  Surgeon: Marea Selinda RAMAN, MD;  Location: [facility];  Service: Cardiovascular;  Laterality: N/A;   DIALYSIS/PERMA CATHETER REPAIR N/A 01/22/2022   Procedure: DIALYSIS/PERMA CATHETER INSERT;  Surgeon: Marea Selinda RAMAN, MD;  Location: [facility];  Service: Cardiovascular;  Laterality: N/A;   MANDIBLE SURGERY     RENAL BIOPSY Right    RIGHT HEART CATH N/A 05/10/2020   Procedure: RIGHT HEART CATH;  Surgeon: Mady Bruckner, MD;  Location: [facility];  Service: Cardiovascular;  Laterality: N/A;   RIGHT/LEFT HEART CATH AND CORONARY ANGIOGRAPHY Bilateral 08/15/2023   Procedure: RIGHT/LEFT HEART CATH AND CORONARY ANGIOGRAPHY;  Surgeon: Anner Alm ORN, MD;  Location: [facility];  Service: Cardiovascular;  Laterality: Bilateral;   TEMPORARY DIALYSIS CATHETER N/A 05/11/2020   Procedure: TEMPORARY DIALYSIS CATHETER;  Surgeon: Marea Selinda RAMAN, MD;  Location: [facility];  Service: Cardiovascular;  Laterality: N/A;    Allergies: Allergies as of 01/31/2024 - Review Complete 01/31/2024  Allergen Reaction Noted   Penicillins Nausea And Vomiting 05/10/2020    Medications:  Current Facility-Administered Medications:    iohexol  (OMNIPAQUE ) 350 MG/ML injection 75 mL, 75 mL, Intravenous, Once PRN, Viviann Pastor, MD  Current Outpatient Medications:    acetaminophen  (TYLENOL ) 500 MG tablet, Take 500 mg by mouth 3 (three) times daily.  Take additional 500 mg every 6 hours as needed for pain, Disp: , Rfl:    ADVAIR DISKUS 250-50 MCG/ACT AEPB, Inhale 1 puff into the lungs 2 (two) times daily., Disp: , Rfl:    albuterol  (VENTOLIN  HFA) 108 (90 Base) MCG/ACT inhaler, Inhale 1 puff into the lungs every 6 (six) hours as needed for wheezing., Disp: , Rfl:    aspirin  81  MG chewable tablet, Chew 81 mg by mouth daily., Disp: , Rfl:    bisacodyl  (DULCOLAX) 5 MG EC tablet, Take 2 tablets (10 mg total) by mouth at bedtime., Disp: , Rfl:    calcitRIOL  (ROCALTROL ) 0.25 MCG capsule, Take 1 capsule (0.25 mcg total) by mouth daily., Disp: , Rfl:    calcium  carbonate (TUMS - DOSED IN MG ELEMENTAL CALCIUM ) 500 MG chewable tablet, Chew 1 tablet by mouth 3 (three) times daily as needed (GRED)., Disp: , Rfl:    DULoxetine  (CYMBALTA ) 30 MG capsule, Take 30 mg by mouth daily., Disp: , Rfl:    famotidine  (PEPCID ) 20 MG tablet, Take 10 mg by mouth at bedtime., Disp: , Rfl:    guaiFENesin -dextromethorphan  (ROBITUSSIN DM) 100-10 MG/5ML syrup, Take 10 mLs by mouth every 6 (six) hours as needed for cough., Disp: , Rfl:    melatonin 3 MG TABS tablet, Take 3 mg by mouth at bedtime., Disp: , Rfl:    metoprolol  tartrate (LOPRESSOR ) 25 MG tablet, Take 1 tablet (25 mg total) by mouth 2 (two) times daily as needed (SBP >150)., Disp: , Rfl:    midodrine  (PROAMATINE ) 5 MG tablet, Take 5 mg by mouth. Monday, Wednesday, Friday prior to dialysis, Disp: , Rfl:    ondansetron  (ZOFRAN ) 4 MG tablet, Take 4 mg by mouth every Monday, Wednesday, and Friday., Disp: , Rfl:    ondansetron  (ZOFRAN -ODT) 4 MG disintegrating tablet, Take 1 tablet (4 mg total) by mouth every 8 (eight) hours as needed for nausea or vomiting. (Patient not taking: Reported on 01/14/2024), Disp: 30 tablet, Rfl: 0   oxycodone  (OXY-IR) 5 MG capsule, Take 1 capsule (5 mg total) by mouth every 6 (six) hours., Disp: 10 capsule, Rfl: 0   pantoprazole  (PROTONIX ) 40 MG tablet, Take 40 mg by mouth daily., Disp: , Rfl:    polyethylene glycol (MIRALAX  / GLYCOLAX ) 17 g packet, Take 17 g by mouth 2 (two) times daily., Disp: , Rfl:    pramoxine (SARNA SENSITIVE) 1 % LOTN, Apply 1 Application topically 2 (two) times daily., Disp: , Rfl:    topiramate  (TOPAMAX ) 50 MG tablet, Take 1 tablet (50 mg total) by mouth 2 (two) times daily as needed  (headache)., Disp: , Rfl:    traZODone  (DESYREL ) 50 MG tablet, Take 75 mg by mouth at bedtime., Disp: , Rfl:    Social History: Social History   Tobacco Use   Smoking status: Former    Types: Cigarettes   Smokeless tobacco: Never   Tobacco comments:    Quit over 15 years ago  Vaping Use   Vaping status: Never Used  Substance Use Topics   Alcohol  use: Not Currently   Drug use: Never    Family Medical History: Family History  Problem Relation Age of Onset   Heart attack Mother    Asthma Mother    Uterine cancer Mother    Skin cancer Father    Prostate cancer Father    CAD Father    Parkinson's disease Father    Lung cancer Brother     Physical Examination: Vitals:  01/31/24 1530 01/31/24 1600  BP: (!) 163/73 (!) 162/62  Pulse: 91 87  Resp: 13 18  SpO2: 97% 95%     General: Patient is well developed, well nourished, calm, collected, and in no apparent distress.  NEUROLOGICAL:  She is awake alert and conversant.  She does have some intermittent word finding difficulty.  She states that she feels like her left side is activating slower than her right side but she states that often happens on her dialysis days.  She is antigravity throughout.  No superficial evidence of cranial trauma  Imaging: CT Head Wo Contrast Result Date: 01/31/2024 CLINICAL DATA:  Head trauma with mental status change. EXAM: CT HEAD WITHOUT CONTRAST TECHNIQUE: Contiguous axial images were obtained from the base of the skull through the vertex without intravenous contrast. RADIATION DOSE REDUCTION: This exam was performed according to the departmental dose-optimization program which includes automated exposure control, adjustment of the mA and/or kV according to patient size and/or use of iterative reconstruction technique. COMPARISON:  01/14/2024 FINDINGS: Traumatic Brain Injury Risk Stratification Skull Fracture: No - Low/mBIG 1 Subdural Hematoma (SDH): Subtle amount of acute subdural hemorrhage  along the posterior dependent superior and right-sided tentorium. Subarachnoid Hemorrhage [facility]): No Epidural Hematoma (EDH): No - Low/mBIG 1 Cerebral contusion, intra-axial, intraparenchymal Hemorrhage (IPH): Acute intraparenchymal hemorrhage over the posterior right temporal lobe measuring approximately 1.8 x 1.8 x 0.9 cm in AP, transverse and craniocaudal dimension. BIG 3. Intraventricular Hemorrhage (IVH): No - Low/mBIG 1 Midline Shift > 1mm or Edema/effacement of sulci/vents: No - Low/mBIG 1 ---------------------------------------------------- Brain: Ventricles and cisterns are normal. Acute intraparenchymal hemorrhage as described over the posterior right temporal lobe with mild associated adjacent edema/local mass effect. No midline shift. Subtle acute hemorrhage along the posterior interhemispheric fissure and along the right tentorium. Vascular: No hyperdense vessel or unexpected calcification. Skull: No acute fracture. Sinuses/Orbits: No acute finding. Other: None. IMPRESSION: 1. Acute intraparenchymal hemorrhage over the posterior right temporal lobe measuring 1.8 x 1.8 x 0.9 cm with mild associated adjacent edema/local mass effect. No midline shift. Compatible with BIG-3 classification of mTBI. 2. Subtle acute hemorrhage along the posterior interhemispheric fissure and along the right tentorium. Critical Value/emergent results were called by telephone at the time of interpretation on 01/31/2024 at 2:05 pm to provider PHILLIP STAFFORD , who verbally acknowledged these results. Electronically Signed   By: Toribio Agreste M.D.   On: 01/31/2024 14:08   Awaiting final reads on the CTA that was ordered.  Possible ovoid filling defect in the right transverse sinus near the confluence.    I have personally reviewed the images and agree with the above interpretation.  Hematoma with surrounding edema noted.  Relatively uncommon place for a traumatic hematoma.  Significant differential includes watershed infarct  with hemorrhagic conversion, in the setting of the possible ovoid filling defect in the transverse sinus consider a DVST related bleed.  Labs:    Latest Ref Rng & Units 01/31/2024   12:06 PM 01/24/2024    5:19 PM 01/16/2024    3:26 AM  CBC  WBC 4.0 - 10.5 K/uL 7.2  7.9  5.1   Hemoglobin 12.0 - 15.0 g/dL 89.6  89.0  9.8   Hematocrit 36.0 - 46.0 % 32.8  35.3  31.1   Platelets 150 - 400 K/uL 110  165  145       Latest Ref Rng & Units 01/31/2024   12:06 PM 01/24/2024    5:19 PM 01/16/2024    3:26 AM  BMP  Glucose 70 - 99 mg/dL 880  78  83   BUN 8 - 23 mg/dL 93  35  35   Creatinine 0.44 - 1.00 mg/dL 4.52  8.27  7.13   Sodium 135 - 145 mmol/L 139  136  138   Potassium 3.5 - 5.1 mmol/L 5.0  3.7  4.3   Chloride 98 - 111 mmol/L 100  94  98   CO2 22 - 32 mmol/L 25  31  30    Calcium  8.9 - 10.3 mg/dL 8.6  8.4  9.1         Assessment and Plan: Katie [name] is a pleasant 68 y.o. female with episode of somnolence and limited reactivity during her dialysis.  She was brought to the emergency department and found to have a intraparenchymal hematoma near the MCA PCA watershed region on the right.  At this point the differential remains vast.  She states that she has not had any head trauma since she presented to the emergency department  17 days ago for head trauma where she had a negative head CT.  Will await final read for the CTA to see whether or not there is any aneurysmal change as she has had previous sepsis and infection for soap concern for something like a mycotic aneurysm hemorrhage.  Also consider hemorrhagic conversion of a watershed type infarct given its location given her major blood pressure drops during dialysis.  Will defer to neurology.  In the setting of intraparenchymal hematoma, would like a repeat head CT 8 hours after the index scan to evaluate for expansion.  At this point does not appear to be surgical regardless of its etiology  Penne MICAEL Sharps, MD/MSCR Dept. of  Neurosurgery   Addendum 02/01/24: Follow-up head CT demonstrates no evidence of expansion of the intraparenchymal hematoma or subdural hematoma.  Continue to recommend evaluation at the MRI as discussed with neurology for possibility of a hemorrhagic conversion of an ischemic event.  CT HEAD WO CONTRAST ( ) Result Date: 01/31/2024 EXAM: CT HEAD WITHOUT CONTRAST 01/31/2024 08:25:41 PM TECHNIQUE: CT of the head was performed without the administration of intravenous contrast. Automated exposure control, iterative reconstruction, and/or weight based adjustment of the mA/kV was utilized to reduce the radiation dose to as low as reasonably achievable. COMPARISON: 01/31/2024 01:40:00 PM CLINICAL HISTORY: Follow up [facility]. FINDINGS: BRAIN AND VENTRICLES: Unchanged appearance of intraparenchymal hematoma in the right posterior temporal lobe. There is a small subdural hematoma along the left anterior convexity that was not visible on the prior scan. This measures 4 mm in thickness. No mass effect or midline shift. Mild volume loss. ORBITS: No acute abnormality. SINUSES: No acute abnormality. SOFT TISSUES AND SKULL: No acute soft tissue abnormality. No skull fracture. VASCULATURE: ICA atherosclerosis. IMPRESSION: 1. Unchanged intraparenchymal hematoma in the right posterior temporal lobe. 2. Small left anterior convexity subdural hematoma measuring 4 mm, not visible on prior, but the relatively low density suggests that it may be subacute. Short interval follow-up recommended. Electronically signed by: Franky Stanford MD 01/31/2024 08:31 PM EDT RP Workstation: [id number]

## 2024-01-31 NOTE — ED Provider Notes (Signed)
 Discussed with neurology- dr germaine- nothing from their perspective given this is traumatic bleed.  Discussed with Dr. Claudene who did feel like this was potentially more neurological in process given the fall happened and he had a CT scan that was negative and then now has this new CT scan.  Therefore neurology was reinvolved.  Patient was admitted to the hospitalist for further workup of this.   Ernest Ronal BRAVO, MD 01/31/24 [phone]

## 2024-01-31 NOTE — H&P (Signed)
 History and Physical    Katie [name] FMW:[id number] DOB: 14-May-1956 DOA: 01/31/2024  PCP: Cordella Corning, FNP (Confirm with patient/family/NH records and if not entered, this has to be entered at [facility] point of entry) Patient coming from: Home  I have personally briefly reviewed patient's old medical records in [facility]  Chief Complaint: Decreased level of consciousness  HPI: Katie [name] is a 68 y.o. female with medical history significant of COPD, hypertension, ESRD on HD Monday Wednesday Friday sent to the ED from dialysis center due to decreased level of awareness during dialysis session today.  Also apparently dialysis staff worried the respiratory rate has decreased.  On arrival CT imaging demonstrated a intraparenchymal hematoma.  Neurology and neurosurgery aware.  By the time that the patient had been transported from her dialysis center to the emergency room her mental status had recovered.  She states that she gets severe blood pressure drops during dialysis days.  Patient was endorsing headaches mostly worsening over the past month.  Last head trauma was secondary to a fall 2 weeks ago and head CT at that time did not show intraparenchymal bleed.  Patient denies any seizure-like activity.  Vital signs stable.  Neurology and neurosurgery consulted prior to my evaluation.  ED Course: EDP spoke with neurology and neurosurgery.  Neurosurgery recommends close monitoring with serial CT scans 8 hours apart.  No recommendations from neurology as this is likely a traumatic bleed.  Review of Systems: As per HPI otherwise 14 point review of systems negative.    Past Medical History:  Diagnosis Date   Chronic diastolic heart failure (HCC)    COPD (chronic obstructive pulmonary disease) (HCC)    Diabetes mellitus without complication (HCC)    ESRD on dialysis (HCC)    Hypertension    Severe aortic stenosis     Past Surgical History:  Procedure Laterality Date   A/V SHUNT  INTERVENTION Left 12/21/2021   Procedure: A/V SHUNT INTERVENTION;  Surgeon: Marea Selinda RAMAN, MD;  Location: [facility] INVASIVE CV LAB;  Service: Cardiovascular;  Laterality: Left;   APPENDECTOMY     AV FISTULA PLACEMENT Left 03/16/2021   Procedure: INSERTION OF ARTERIOVENOUS (AV) GORE-TEX GRAFT ARM;  Surgeon: Marea Selinda RAMAN, MD;  Location: [facility] ORS;  Service: Vascular;  Laterality: Left;   DIALYSIS/PERMA CATHETER INSERTION N/A 05/16/2020   Procedure: DIALYSIS/PERMA CATHETER INSERTION;  Surgeon: Marea Selinda RAMAN, MD;  Location: [facility] INVASIVE CV LAB;  Service: Cardiovascular;  Laterality: N/A;   DIALYSIS/PERMA CATHETER REMOVAL N/A 03/26/2022   Procedure: DIALYSIS/PERMA CATHETER REMOVAL;  Surgeon: Marea Selinda RAMAN, MD;  Location: [facility] INVASIVE CV LAB;  Service: Cardiovascular;  Laterality: N/A;   DIALYSIS/PERMA CATHETER REPAIR N/A 01/22/2022   Procedure: DIALYSIS/PERMA CATHETER INSERT;  Surgeon: Marea Selinda RAMAN, MD;  Location: [facility] INVASIVE CV LAB;  Service: Cardiovascular;  Laterality: N/A;   MANDIBLE SURGERY     RENAL BIOPSY Right    RIGHT HEART CATH N/A 05/10/2020   Procedure: RIGHT HEART CATH;  Surgeon: Mady Bruckner, MD;  Location: [facility] INVASIVE CV LAB;  Service: Cardiovascular;  Laterality: N/A;   RIGHT/LEFT HEART CATH AND CORONARY ANGIOGRAPHY Bilateral 08/15/2023   Procedure: RIGHT/LEFT HEART CATH AND CORONARY ANGIOGRAPHY;  Surgeon: Anner Alm ORN, MD;  Location: [facility] INVASIVE CV LAB;  Service: Cardiovascular;  Laterality: Bilateral;   TEMPORARY DIALYSIS CATHETER N/A 05/11/2020   Procedure: TEMPORARY DIALYSIS CATHETER;  Surgeon: Marea Selinda RAMAN, MD;  Location: [facility] INVASIVE CV LAB;  Service: Cardiovascular;  Laterality: N/A;     reports  that she has quit smoking. Her smoking use included cigarettes. She has never used smokeless tobacco. She reports that she does not currently use alcohol . She reports that she does not use drugs.  Allergies  Allergen Reactions   Penicillins Nausea And Vomiting    Pt states only  pills make her vomit Other reaction(s): Nausea/Vomit    Family History  Problem Relation Age of Onset   Heart attack Mother    Asthma Mother    Uterine cancer Mother    Skin cancer Father    Prostate cancer Father    CAD Father    Parkinson's disease Father    Lung cancer Brother     Prior to Admission medications   Medication Sig Start Date End Date Taking? Authorizing Provider  acetaminophen  (TYLENOL ) 500 MG tablet Take 500 mg by mouth 3 (three) times daily. Take additional 500 mg every 6 hours as needed for pain    [provider]  ADVAIR DISKUS 250-50 MCG/ACT AEPB Inhale 1 puff into the lungs 2 (two) times daily. 05/21/23   [provider]  albuterol  (VENTOLIN  HFA) 108 (90 Base) MCG/ACT inhaler Inhale 1 puff into the lungs every 6 (six) hours as needed for wheezing. 01/29/22   [provider]  aspirin  81 MG chewable tablet Chew 81 mg by mouth daily. 01/14/24   [provider]  bisacodyl  (DULCOLAX) 5 MG EC tablet Take 2 tablets (10 mg total) by mouth at bedtime. 01/16/24   Von Bellis, MD  calcitRIOL  (ROCALTROL ) 0.25 MCG capsule Take 1 capsule (0.25 mcg total) by mouth daily. 01/17/24 01/16/25  Von Bellis, MD  calcium  carbonate (TUMS - DOSED IN MG ELEMENTAL CALCIUM ) 500 MG chewable tablet Chew 1 tablet by mouth 3 (three) times daily as needed (GRED).    [provider]  DULoxetine  (CYMBALTA ) 30 MG capsule Take 30 mg by mouth daily. 08/06/23   [provider]  famotidine  (PEPCID ) 20 MG tablet Take 10 mg by mouth at bedtime.    [provider]  guaiFENesin -dextromethorphan  (ROBITUSSIN DM) 100-10 MG/5ML syrup Take 10 mLs by mouth every 6 (six) hours as needed for cough. 01/16/24   Von Bellis, MD  melatonin 3 MG TABS tablet Take 3 mg by mouth at bedtime.    [provider]  metoprolol  tartrate (LOPRESSOR ) 25 MG tablet Take 1 tablet (25 mg total) by mouth 2 (two) times daily as needed (SBP >150). 01/16/24   Von Bellis, MD   midodrine  (PROAMATINE ) 5 MG tablet Take 5 mg by mouth. Monday, Wednesday, Friday prior to dialysis 05/06/23   [provider]  ondansetron  (ZOFRAN ) 4 MG tablet Take 4 mg by mouth every Monday, Wednesday, and Friday. 04/27/23   [provider]  ondansetron  (ZOFRAN -ODT) 4 MG disintegrating tablet Take 1 tablet (4 mg total) by mouth every 8 (eight) hours as needed for nausea or vomiting. Patient not taking: Reported on 01/14/2024 12/16/23   Suzanne Kirsch, MD  oxycodone  (OXY-IR) 5 MG capsule Take 1 capsule (5 mg total) by mouth every 6 (six) hours. 01/16/24   Von Bellis, MD  pantoprazole  (PROTONIX ) 40 MG tablet Take 40 mg by mouth daily. 01/14/24   [provider]  polyethylene glycol (MIRALAX  / GLYCOLAX ) 17 g packet Take 17 g by mouth 2 (two) times daily. 01/16/24   Von Bellis, MD  pramoxine [facility] SENSITIVE) 1 % LOTN Apply 1 Application topically 2 (two) times daily.    [provider]  topiramate  (TOPAMAX ) 50 MG tablet Take 1  tablet (50 mg total) by mouth 2 (two) times daily as needed (headache). 03/21/21   Jhonny Calvin NOVAK, MD  traZODone  (DESYREL ) 50 MG tablet Take 75 mg by mouth at bedtime. 01/14/24   [provider]    Physical Exam: Vitals:   01/31/24 1640 01/31/24 1642 01/31/24 1700 01/31/24 1726  BP:   (!) 156/67   Pulse: 92 93 92   Resp: 18 (!) 22 16   Temp:    98.3 F (36.8 C)  SpO2: 97% 97% 96%   Weight:      Height:        General: NAD.  Appears chronically ill but otherwise stable HEENT: Normocephalic, atraumatic Neck, supple, trachea midline, no tenderness Heart: Regular rate and rhythm, S1/S2 normal, no murmurs Lungs: Clear.  Normal work of breathing.  Room air Abdomen: Soft, nontender, nondistended, positive bowel sounds Extremities: Normal, atraumatic, no clubbing or cyanosis, normal muscle tone.  Left upper extremity aVF Skin: No rashes or lesions, normal color Neurologic: Cranial nerves grossly intact, sensation  intact, alert and oriented x3 Psychiatric: Normal affect   Labs on Admission: I have personally reviewed following labs and imaging studies  CBC: Recent Labs  Lab 01/31/24 1206  WBC 7.2  NEUTROABS 5.3  HGB 10.3*  HCT 32.8*  MCV 102.8*  PLT 110*   Basic Metabolic Panel: Recent Labs  Lab 01/31/24 1206  NA 139  K 5.0  CL 100  CO2 25  GLUCOSE 119*  BUN 93*  CREATININE 5.47*  CALCIUM  8.6*   GFR: Estimated Creatinine Clearance: 8.9 mL/min (A) (by C-G formula based on SCr of 5.47 mg/dL (H)). Liver Function Tests: No results for input(s): AST, ALT, ALKPHOS, BILITOT, PROT, ALBUMIN  in the last 168 hours. No results for input(s): LIPASE, AMYLASE in the last 168 hours. No results for input(s): AMMONIA in the last 168 hours. Coagulation Profile: No results for input(s): INR, PROTIME in the last 168 hours. Cardiac Enzymes: No results for input(s): CKTOTAL, CKMB, CKMBINDEX, TROPONINI in the last 168 hours. BNP (last 3 results) No results for input(s): PROBNP in the last 8760 hours. HbA1C: No results for input(s): HGBA1C in the last 72 hours. CBG: Recent Labs  Lab 01/24/24 2001  GLUCAP 176*   Lipid Profile: No results for input(s): CHOL, HDL, LDLCALC, TRIG, CHOLHDL, LDLDIRECT in the last 72 hours. Thyroid Function Tests: No results for input(s): TSH, T4TOTAL, FREET4, T3FREE, THYROIDAB in the last 72 hours. Anemia Panel: No results for input(s): VITAMINB12, FOLATE, FERRITIN, TIBC, IRON, RETICCTPCT in the last 72 hours. Urine analysis:    Component Value Date/Time   COLORURINE YELLOW (A) 01/31/2024 1206   APPEARANCEUR CLEAR (A) 01/31/2024 1206   LABSPEC 1.014 01/31/2024 1206   PHURINE 6.0 01/31/2024 1206   GLUCOSEU NEGATIVE 01/31/2024 1206   HGBUR NEGATIVE 01/31/2024 1206   BILIRUBINUR NEGATIVE 01/31/2024 1206   KETONESUR NEGATIVE 01/31/2024 1206   PROTEINUR 100 (A) 01/31/2024 1206   NITRITE NEGATIVE  01/31/2024 1206   LEUKOCYTESUR SMALL (A) 01/31/2024 1206    Radiological Exams on Admission: CT Angio Head W or Wo Contrast Result Date: 01/31/2024 EXAM: CTA Head without and with Intravenous Contrast CLINICAL HISTORY: Cerebral aneurysm screening, high-risk. TECHNIQUE: Axial CTA images of the head without and with intravenous contrast. MIP reconstructed images were created and reviewed. Dose reduction technique was used including one or more of the following: automated exposure control, adjustment of mA and kV according to patient size, and/or iterative reconstruction. CONTRAST: 75mL (iohexol  (OMNIPAQUE ) 350 MG/ML injection 75 mL  IOHEXOL  350 MG/ML SOLN) COMPARISON: MRA Head 06/25/2020 FINDINGS: INTERNAL CAROTID ARTERIES: The internal carotid arteries are patent from the included distal cervical segments through the termini with atherosclerotic calcifications resulting in mild cavernous and proximal supraclinoid stenoses bilaterally. ANTERIOR CEREBRAL ARTERIES: ACAs are patent without evidence of a proximal branch occlusion or significant proximal stenosis. MIDDLE CEREBRAL ARTERIES: MCAs are patent without evidence of a proximal branch occlusion or significant proximal stenosis. POSTERIOR CEREBRAL ARTERIES: Both PCAs are patent without evidence of any significant proximal stenosis. There are robust posterior communicating arteries bilaterally with hypoplasia of the right greater than left P1 segments. BASILAR ARTERY: The basilar artery is widely patent. VERTEBRAL ARTERIES: The included distal vertebral arteries are patent to the basilar with the right being strongly dominant. Atherosclerotic calcification results in moderate to severe stenoses of the distal left V3 and V4 segments. The majority of the left V4 segment is now diffusely small in caliber which reflects a change from the prior MRA. No aneurysm or vascular malformation is identified. SOFT TISSUES: No acute finding. BONES: No acute osseous  abnormality. VENOUS SINUSES: The major dural venous sinuses appear patent. IMPRESSION: 1. No large vessel occlusion or aneurysm. 2. 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 Electronically signed by: Dasie Hamburg MD 01/31/2024 05:23 PM EDT RP Workstation: [id number]   CT Head Wo Contrast Result Date: 01/31/2024 CLINICAL DATA:  Head trauma with mental status change. EXAM: CT HEAD WITHOUT CONTRAST TECHNIQUE: Contiguous axial images were obtained from the base of the skull through the vertex without intravenous contrast. RADIATION DOSE REDUCTION: This exam was performed according to the departmental dose-optimization program which includes automated exposure control, adjustment of the mA and/or kV according to patient size and/or use of iterative reconstruction technique. COMPARISON:  01/14/2024 FINDINGS: Traumatic Brain Injury Risk Stratification Skull Fracture: No - Low/mBIG 1 Subdural Hematoma (SDH): Subtle amount of acute subdural hemorrhage along the posterior dependent superior and right-sided tentorium. Subarachnoid Hemorrhage [facility]): No Epidural Hematoma (EDH): No - Low/mBIG 1 Cerebral contusion, intra-axial, intraparenchymal Hemorrhage (IPH): Acute intraparenchymal hemorrhage over the posterior right temporal lobe measuring approximately 1.8 x 1.8 x 0.9 cm in AP, transverse and craniocaudal dimension. BIG 3. Intraventricular Hemorrhage (IVH): No - Low/mBIG 1 Midline Shift > 1mm or Edema/effacement of sulci/vents: No - Low/mBIG 1 ---------------------------------------------------- Brain: Ventricles and cisterns are normal. Acute intraparenchymal hemorrhage as described over the posterior right temporal lobe with mild associated adjacent edema/local mass effect. No midline shift. Subtle acute hemorrhage along the posterior interhemispheric fissure and along the right tentorium. Vascular: No hyperdense vessel or unexpected  calcification. Skull: No acute fracture. Sinuses/Orbits: No acute finding. Other: None. IMPRESSION: 1. Acute intraparenchymal hemorrhage over the posterior right temporal lobe measuring 1.8 x 1.8 x 0.9 cm with mild associated adjacent edema/local mass effect. No midline shift. Compatible with BIG-3 classification of mTBI. 2. Subtle acute hemorrhage along the posterior interhemispheric fissure and along the right tentorium. Critical Value/emergent results were called by telephone at the time of interpretation on 01/31/2024 at 2:05 pm to provider PHILLIP STAFFORD , who verbally acknowledged these results. Electronically Signed   By: Toribio Agreste M.D.   On: 01/31/2024 14:08    EKG: Independently reviewed.  Sinus rhythm  Assessment/Plan Principal Problem:   Intracranial hematoma following injury, without loss of consciousness, initial encounter [facility]) Active Problems:   Intraparenchymal hematoma of brain (HCC)  Intraparenchymal hematoma of the brain Decreased level of consciousness Patient reports 2 falls in the  first approximately 2 weeks ago which no intraparenchymal hematoma was noted.  He was noted to have decreased level of awareness in hemodialysis and transported to the ED.  Neurosurgery consulted prior to my arrival.  No urgent neurosurgical intervention recommended at this time.  CTA without LVO or bleed Plan: Placed observation Neurosurgery to see in a.m. Repeat head CT at 8 PM tonight Okay for diet No urgent neurosurgical intervention  ESRD on HD Nephrology consulted No indication for emergent HD Dr. Lazarus to see in a.m.  Hypertension Continue medications As needed IV labetalol   COPD No evidence of exacerbation On room air Resume home bronchodilators  GERD PPI and H2 blocker   DVT prophylaxis: SCD Code Status: DNR.  Discussed with patient Family Communication: None at bedside Disposition Plan: Return to previous home environment Consults called: Nephrology,  neurosurgery Admission status: Observation, telemetry   Calvin KATHEE Robson MD [facility]   If 7PM-7AM, please contact night-coverage   01/31/2024, 6:04 PM

## 2024-01-31 NOTE — ED Provider Notes (Signed)
 [facility] The Provider Note    Event Date/Time   First MD Initiated Contact with Patient 01/31/24 1204     (approximate)   History   Chief Complaint: Altered Mental Status   HPI  Katie [name] is a 68 y.o. female with a history of COPD, diabetes, hypertension, ESRD on hemodialysis who is sent to the ED from her dialysis center today due to decreased alertness during the dialysis session today.  They were worried that her respiratory rate had decreased.  EMS report on their arrival patient was awake, alert, breathing normally on her own.  Patient denies any acute pain or other complaints.  Denies shortness of breath  Dialysis documentation shows typical post session dry weight is 55 kg.  Usually needs 1 to 1.5 L ultrafiltration.  Today her pretreatment weight was 54.5 kg.        Past Medical History:  Diagnosis Date   Chronic diastolic heart failure (HCC)    COPD (chronic obstructive pulmonary disease) (HCC)    Diabetes mellitus without complication (HCC)    ESRD on dialysis [facility])    Hypertension    Severe aortic stenosis     Current Outpatient Rx   Order #: [id number] Class: Historical Med   Order #: [id number] Class: Historical Med   Order #: [id number] Class: Historical Med   Order #: [id number] Class: Historical Med   Order #: [id number] Class: No Print   Order #: [id number] Class: No Print   Order #: [id number] Class: Historical Med   Order #: [id number] Class: Historical Med   Order #: [id number] Class: Historical Med   Order #: [id number] Class: No Print   Order #: [id number] Class: Historical Med   Order #: [id number] Class: No Print   Order #: [id number] Class: Historical Med   Order #: [id number] Class: Historical Med   Order #: [id number] Class: Print   Order #: [id number] Class: Print   Order #: [id number] Class: Historical Med   Order #: [id number] Class: No Print   Order #: [id number] Class: Historical Med   Order #: [id number] Class: No Print   Order #:  [id number] Class: Historical Med    Past Surgical History:  Procedure Laterality Date   A/V SHUNT INTERVENTION Left 12/21/2021   Procedure: A/V SHUNT INTERVENTION;  Surgeon: Marea Selinda RAMAN, MD;  Location: [facility] INVASIVE CV LAB;  Service: Cardiovascular;  Laterality: Left;   APPENDECTOMY     AV FISTULA PLACEMENT Left 03/16/2021   Procedure: INSERTION OF ARTERIOVENOUS (AV) GORE-TEX GRAFT ARM;  Surgeon: Marea Selinda RAMAN, MD;  Location: [facility] ORS;  Service: Vascular;  Laterality: Left;   DIALYSIS/PERMA CATHETER INSERTION N/A 05/16/2020   Procedure: DIALYSIS/PERMA CATHETER INSERTION;  Surgeon: Marea Selinda RAMAN, MD;  Location: [facility] INV[facility] LAB;  Service: Cardiovascular;  Laterality: N/A;   DIALYSIS/PERMA CATHETER REMOVAL N/A 03/26/2022   Procedure: DIALYSIS/PERMA CATHETER REMOVAL;  Surgeon: Marea Selinda RAMAN, MD;  Location: [facility];  Service: Cardiovascular;  Laterality: N/A;   DIALYSIS/PERMA CATHETER REPAIR N/A 01/22/2022   Procedure: DIALYSIS/PERMA CATHETER INSERT;  Surgeon: Marea Selinda RAMAN, MD;  Location: [facility];  Service: Cardiovascular;  Laterality: N/A;   MANDIBLE SURGERY     RENAL BIOPSY Right    RIGHT HEART CATH N/A 05/10/2020   Procedure: RIGHT HEART CATH;  Surgeon: Mady Bruckner, MD;  Location: [facility] LAB;  Service: Cardiovascular;  Laterality: N/A;   RIGHT/LEFT HEART CATH AND CORONARY ANGIOGRAPHY Bilateral 08/15/2023   Procedure: RIGHT/LEFT HEART CATH AND CORONARY ANGIOGRAPHY;  Surgeon: Anner Alm ORN, MD;  Location: [facility] LAB;  Service: Cardiovascular;  Laterality: Bilateral;   TEMPORARY DIALYSIS CATHETER N/A 05/11/2020   Procedure: TEMPORARY DIALYSIS CATHETER;  Surgeon: Marea Selinda RAMAN, MD;  Location: [facility];  Service: Cardiovascular;  Laterality: N/A;    Physical Exam   Triage Vital Signs: ED Triage Vitals  Encounter Vitals Group     BP 01/31/24 1201 (!) 151/56     Girls Systolic BP Percentile --      Girls Diastolic BP Percentile --       Boys Systolic BP Percentile --      Boys Diastolic BP Percentile --      Pulse Rate 01/31/24 1159 85     Resp 01/31/24 1201 18     Temp --      Temp src --      SpO2 01/31/24 1201 97 %     Weight 01/31/24 1201 121 lb (54.9 kg)     Height 01/31/24 1201 5' 6 (1.676 m)     Head Circumference --      Peak Flow --      Pain Score 01/31/24 1201 0     Pain Loc --      Pain Education --      Exclude from Growth Chart --     Most recent vital signs: Vitals:   01/31/24 1430 01/31/24 1500  BP: (!) 165/62   Pulse: 91 87  Resp: (!) 22 20  SpO2: 100% 95%    General: Awake, no distress.  CV:  Good peripheral perfusion.  Regular rate rhythm Resp:  Normal effort.  Clear lungs Abd:  No distention.  Soft nontender.  No lower extremity edema Other:  Dry oral mucosa   ED Results / Procedures / Treatments   Labs (all labs ordered are listed, but only abnormal results are displayed) Labs Reviewed  BASIC METABOLIC PANEL WITH GFR - Abnormal; Notable for the following components:      Result Value   Glucose, Bld 119 (*)    BUN 93 (*)    Creatinine, Ser 5.47 (*)    Calcium  8.6 (*)    GFR, Estimated 8 (*)    All other components within normal limits  CBC WITH DIFFERENTIAL/PLATELET - Abnormal; Notable for the following components:   RBC 3.19 (*)    Hemoglobin 10.3 (*)    HCT 32.8 (*)    MCV 102.8 (*)    Platelets 110 (*)    All other components within normal limits  URINALYSIS, W/ REFLEX TO CULTURE (INFECTION SUSPECTED) - Abnormal; Notable for the following components:   Color, Urine YELLOW (*)    APPearance CLEAR (*)    Protein, ur 100 (*)    Leukocytes,Ua SMALL (*)    All other components within normal limits  URINE CULTURE     EKG Interpreted by me Sinus rhythm rate of 84.  Normal axis, left bundle branch block, no acute ischemic changes.   RADIOLOGY CT head interpreted by me, shows 10 mm area of hyperdensity in the right posterior temporal lobe above the tentorium, new  compared to prior CT on January 14, 2024.  Radiology report reviewed   PROCEDURES:  Procedures   MEDICATIONS ORDERED IN ED: Medications  sodium chloride  0.9 % bolus 500 mL (500 mLs Intravenous New Bag/Given 01/31/24 1503)  cefUROXime  (CEFTIN ) tablet 500 mg (500 mg Oral Given 01/31/24 1501)     IMPRESSION / MDM / ASSESSMENT AND PLAN / ED COURSE  I reviewed the triage vital signs and the nursing notes.  DDx:  Dehydration, UTI, electrolyte derangement, anemia  Patient's presentation is most consistent with acute presentation with potential threat to life or bodily function.  Patient brought to the ED due to concern for decreased level of consciousness.  Patient does endorse feeling fatigued, denies focal pain but has had decreased oral intake lately and may be somewhat dehydrated.  Serum labs show creatinine has returned mostly to normal since Foley catheter was introduced to the urinary obstruction   ----------------------------------------- 1:54 PM on 01/31/2024 ----------------------------------------- Serum labs show unremarkable electrolytes and CBC.  Urinalysis suggestive of UTI, will start Ceftin .  CT concerning for right posterior hyperdensity   ----------------------------------------- 3:11 PM on 01/31/2024 ----------------------------------------- CT discussed with radiology confirming 1.8 cm intraparenchymal hemorrhage in the right temporal lobe along with some associated subdural hematoma.  Discussed with neurosurgery Penne Sharps who will evaluate, request a CT angiogram in the meantime.  Patient is agreeable with hospitalization if necessary, and indicates a preference for [facility] if transferred to tertiary care center is needed.     FINAL CLINICAL IMPRESSION(S) / ED DIAGNOSES   Final diagnoses:  ESRD on hemodialysis (HCC)  Intraparenchymal hematoma of brain, right, with unknown loss of consciousness status, initial encounter (HCC)     Rx / DC Orders   ED  Discharge Orders     None        Note:  This document was prepared using Dragon voice recognition software and may include unintentional dictation errors.   Viviann Pastor, MD 01/31/24 [phone]

## 2024-01-31 NOTE — ED Triage Notes (Signed)
 Pt to ED via [facility] from dialysis.  NP from dialysis center called out for decreased LOC and decreased respiratory rate. Pt received over half of dialysis treatment and pt began being less responsive. Pt denies pain. Pt alert at this time.   EMS vitals  BP 157 systolic HR 84  SPO2 100 RR 18 CBG 121 T 98.7

## 2024-02-01 ENCOUNTER — Observation Stay

## 2024-02-01 DIAGNOSIS — S062X0A Diffuse traumatic brain injury without loss of consciousness, initial encounter: Secondary | ICD-10-CM | POA: Diagnosis not present

## 2024-02-01 LAB — BASIC METABOLIC PANEL WITH GFR
Anion gap: 17 — ABNORMAL HIGH (ref 5–15)
BUN: 87 mg/dL — ABNORMAL HIGH (ref 8–23)
CO2: 21 mmol/L — ABNORMAL LOW (ref 22–32)
Calcium: 8.6 mg/dL — ABNORMAL LOW (ref 8.9–10.3)
Chloride: 100 mmol/L (ref 98–111)
Creatinine, Ser: 5.95 mg/dL — ABNORMAL HIGH (ref 0.44–1.00)
GFR, Estimated: 7 mL/min — ABNORMAL LOW (ref >60)
Glucose, Bld: 70 mg/dL (ref 70–99)
Potassium: 5.3 mmol/L — ABNORMAL HIGH (ref 3.5–5.1)
Sodium: 138 mmol/L (ref 135–145)

## 2024-02-01 LAB — CBC
HCT: 30.9 % — ABNORMAL LOW (ref 36.0–46.0)
Hemoglobin: 9.5 g/dL — ABNORMAL LOW (ref 12.0–15.0)
MCH: 31.8 pg (ref 26.0–34.0)
MCHC: 30.7 g/dL (ref 30.0–36.0)
MCV: 103.3 fL — ABNORMAL HIGH (ref 80.0–100.0)
Platelets: 86 K/uL — ABNORMAL LOW (ref 150–400)
RBC: 2.99 MIL/uL — ABNORMAL LOW (ref 3.87–5.11)
RDW: 14 % (ref 11.5–15.5)
WBC: 5.1 K/uL (ref 4.0–10.5)
nRBC: 0 % (ref 0.0–0.2)

## 2024-02-01 LAB — GLUCOSE, CAPILLARY: Glucose-Capillary: 101 mg/dL — ABNORMAL HIGH (ref 70–99)

## 2024-02-01 LAB — PHOSPHORUS: Phosphorus: 6.2 mg/dL — ABNORMAL HIGH (ref 2.5–4.6)

## 2024-02-01 LAB — HEPATITIS B SURFACE ANTIGEN: Hepatitis B Surface Ag: NONREACTIVE

## 2024-02-01 MED ORDER — ONDANSETRON HCL 4 MG/2ML IJ SOLN
INTRAMUSCULAR | Status: AC
  Filled 2024-02-01: qty 2

## 2024-02-01 MED ORDER — EPOETIN ALFA-EPBX 4000 UNIT/ML IJ SOLN
4000.0000 [IU] | INTRAMUSCULAR | Status: DC
  Administered 2024-02-03: 4000 [IU] via INTRAVENOUS

## 2024-02-01 NOTE — Progress Notes (Signed)
 [facility]  ROUNDING NOTE   Subjective:  [name] is a 68 y.o.  female with past medical history of ESRD with hemodialysis, diabetes mellitus, COPD, and hypertension. Patient presents to ED from outpatient dialysis. She was found on dialysis with depressed respirations and decreased level of consciousness.  In the ED CT imaging demonstrated a intraparenchymal hematoma.  Neurology and neurosurgery has been consulted.  Mental status improved.   Patient is known to our practice and receives outpatient dialysis treatments at [facility] on a MWF schedule.  Patient seen and evaluated during rounding and while on dialysis while on dialysis.  Patient expresses appreciation to providing her care.  No complaints to offer at this time. Tolerating dialysis.   Hemodialysis dialysis treatment flowsheet  Blood flow rate (mL/min): 399 Arterial pressures (mmHg): -177.77 Venous pressures (mmHg): 213.93 TMP (mmHg): 6.87 Ultrafiltration rate (mL/min): 900 Dialysate (mL/min): 299    Objective:  Vital signs in last 24 hours:  Temp:  [98.2 F (36.8 C)-99.1 F (37.3 C)] 98.3 F (36.8 C) (09/20 1028) Pulse Rate:  [65-100] 65 (09/20 1100) Resp:  [10-22] 10 (09/20 1100) BP: (110-179)/(50-85) 143/64 (09/20 1100) SpO2:  [90 %-100 %] 100 % (09/20 1100) Weight:  [54.9 kg-57.5 kg] 57.2 kg (09/20 1028)  Weight change:  Filed Weights   01/31/24 1201 02/01/24 0551 02/01/24 1028  Weight: 54.9 kg 57.5 kg 57.2 kg    Intake/Output: I/O last 3 completed shifts: In: 240 [P.O.:240] Out: 300 [Urine:300]   Intake/Output this shift:  No intake/output data recorded.  Physical Exam: General: Frail  Head: Normocephalic  Eyes: Anicteric  Neck: trachea midline  Lungs:  Diminished  Heart: Regular rate  Abdomen:  Soft  Extremities:  No peripheral edema.  Neurologic: Alert  Skin: Scattered bruising  Access: Left AVF    Basic Metabolic Panel: Recent Labs  Lab 01/31/24 1206  02/01/24 0535  NA 139 138  K 5.0 5.3*  CL 100 100  CO2 25 21*  GLUCOSE 119* 70  BUN 93* 87*  CREATININE 5.47* 5.95*  CALCIUM  8.6* 8.6*    Liver Function Tests: No results for input(s): AST, ALT, ALKPHOS, BILITOT, PROT, ALBUMIN  in the last 168 hours. No results for input(s): LIPASE, AMYLASE in the last 168 hours. No results for input(s): AMMONIA in the last 168 hours.  CBC: Recent Labs  Lab 01/31/24 1206 02/01/24 0535  WBC 7.2 5.1  NEUTROABS 5.3  --   HGB 10.3* 9.5*  HCT 32.8* 30.9*  MCV 102.8* 103.3*  PLT 110* 86*    Cardiac Enzymes: No results for input(s): CKTOTAL, CKMB, CKMBINDEX, TROPONINI in the last 168 hours.  BNP: Invalid input(s): POCBNP  CBG: No results for input(s): GLUCAP in the last 168 hours.  Microbiology: Results for orders placed or performed during the hospital encounter of 01/24/24  Resp panel by RT-PCR (RSV, Flu A&B, Covid) Anterior Nasal Swab     Status: None   Collection Time: 01/24/24  5:31 PM   Specimen: Anterior Nasal Swab  Result Value Ref Range Status   SARS Coronavirus 2 by RT PCR NEGATIVE NEGATIVE Final    Comment: (NOTE) SARS-CoV-2 target nucleic acids are NOT DETECTED.  The SARS-CoV-2 RNA is generally detectable in upper respiratory specimens during the acute phase of infection. The lowest concentration of SARS-CoV-2 viral copies this assay can detect is 138 copies/mL. A negative result does not preclude SARS-Cov-2 infection and should not be used as the sole basis for treatment or other patient management decisions. A  negative result may occur with  improper specimen collection/handling, submission of specimen other than nasopharyngeal swab, presence of viral mutation(s) within the areas targeted by this assay, and inadequate number of viral copies(<138 copies/mL). A negative result must be combined with clinical observations, patient history, and epidemiological information. The expected result  is Negative.  Fact Sheet for Patients:  BloggerCourse.com  Fact Sheet for Healthcare Providers:  SeriousBroker.it  This test is no t yet approved or cleared by the [place]  FDA and  has been authorized for detection and/or diagnosis of SARS-CoV-2 by FDA under an Emergency Use Authorization (EUA). This EUA will remain  in effect (meaning this test can be used) for the duration of the COVID-19 declaration under Section 564(b)(1) of the Act, 21 U.S.C.section 360bbb-3(b)(1), unless the authorization is terminated  or revoked sooner.       Influenza A by PCR NEGATIVE NEGATIVE Final   Influenza B by PCR NEGATIVE NEGATIVE Final    Comment: (NOTE) The Xpert Xpress SARS-CoV-2/FLU/RSV plus assay is intended as an aid in the diagnosis of influenza from Nasopharyngeal swab specimens and should not be used as a sole basis for treatment. Nasal washings and aspirates are unacceptable for Xpert Xpress SARS-CoV-2/FLU/RSV testing.  Fact Sheet for Patients: BloggerCourse.com  Fact Sheet for Healthcare Providers: SeriousBroker.it  This test is not yet approved or cleared by the [place]  FDA and has been authorized for detection and/or diagnosis of SARS-CoV-2 by FDA under an Emergency Use Authorization (EUA). This EUA will remain in effect (meaning this test can be used) for the duration of the COVID-19 declaration under Section 564(b)(1) of the Act, 21 U.S.C. section 360bbb-3(b)(1), unless the authorization is terminated or revoked.     Resp Syncytial Virus by PCR NEGATIVE NEGATIVE Final    Comment: (NOTE) Fact Sheet for Patients: BloggerCourse.com  Fact Sheet for Healthcare Providers: SeriousBroker.it  This test is not yet approved or cleared by the [place]  FDA and has been authorized for detection and/or diagnosis of  SARS-CoV-2 by FDA under an Emergency Use Authorization (EUA). This EUA will remain in effect (meaning this test can be used) for the duration of the COVID-19 declaration under Section 564(b)(1) of the Act, 21 U.S.C. section 360bbb-3(b)(1), unless the authorization is terminated or revoked.  Performed at [facility], [address] [place], [place] [address]     Coagulation Studies: No results for input(s): LABPROT, INR in the last 72 hours.  Urinalysis: Recent Labs    01/31/24 1206  COLORURINE YELLOW*  LABSPEC 1.014  PHURINE 6.0  GLUCOSEU NEGATIVE  HGBUR NEGATIVE  BILIRUBINUR NEGATIVE  KETONESUR NEGATIVE  PROTEINUR 100*  NITRITE NEGATIVE  LEUKOCYTESUR SMALL*      Imaging: CT HEAD WO CONTRAST ( ) Result Date: 01/31/2024 EXAM: CT HEAD WITHOUT CONTRAST 01/31/2024 08:25:41 PM TECHNIQUE: CT of the head was performed without the administration of intravenous contrast. Automated exposure control, iterative reconstruction, and/or weight based adjustment of the mA/kV was utilized to reduce the radiation dose to as low as reasonably achievable. COMPARISON: 01/31/2024 01:40:00 PM CLINICAL HISTORY: Follow up ICH. FINDINGS: BRAIN AND VENTRICLES: Unchanged appearance of intraparenchymal hematoma in the right posterior temporal lobe. There is a small subdural hematoma along the left anterior convexity that was not visible on the prior scan. This measures 4 mm in thickness. No mass effect or midline shift. Mild volume loss. ORBITS: No acute abnormality. SINUSES: No acute abnormality. SOFT TISSUES AND SKULL: No acute soft tissue abnormality. No skull fracture. VASCULATURE: ICA atherosclerosis. IMPRESSION:  1. Unchanged intraparenchymal hematoma in the right posterior temporal lobe. 2. Small left anterior convexity subdural hematoma measuring 4 mm, not visible on prior, but the relatively low density suggests that it may be subacute. Short interval follow-up recommended.  Electronically signed by: Franky Stanford MD 01/31/2024 08:31 PM EDT RP Workstation: [id number]   CT Angio Head W or Wo Contrast Result Date: 01/31/2024 EXAM: CTA Head without and with Intravenous Contrast CLINICAL HISTORY: Cerebral aneurysm screening, high-risk. TECHNIQUE: Axial CTA images of the head without and with intravenous contrast. MIP reconstructed images were created and reviewed. Dose reduction technique was used including one or more of the following: automated exposure control, adjustment of mA and kV according to patient size, and/or iterative reconstruction. CONTRAST: 75mL (iohexol  (OMNIPAQUE ) 350 MG/ML injection 75 mL IOHEXOL  350 MG/ML SOLN) COMPARISON: MRA Head 06/25/2020 FINDINGS: INTERNAL CAROTID ARTERIES: The internal carotid arteries are patent from the included distal cervical segments through the termini with atherosclerotic calcifications resulting in mild cavernous and proximal supraclinoid stenoses bilaterally. ANTERIOR CEREBRAL ARTERIES: ACAs are patent without evidence of a proximal branch occlusion or significant proximal stenosis. MIDDLE CEREBRAL ARTERIES: MCAs are patent without evidence of a proximal branch occlusion or significant proximal stenosis. POSTERIOR CEREBRAL ARTERIES: Both PCAs are patent without evidence of any significant proximal stenosis. There are robust posterior communicating arteries bilaterally with hypoplasia of the right greater than left P1 segments. BASILAR ARTERY: The basilar artery is widely patent. VERTEBRAL ARTERIES: The included distal vertebral arteries are patent to the basilar with the right being strongly dominant. Atherosclerotic calcification results in moderate to severe stenoses of the distal left V3 and V4 segments. The majority of the left V4 segment is now diffusely small in caliber which reflects a change from the prior MRA. No aneurysm or vascular malformation is identified. SOFT TISSUES: No acute finding. BONES: No acute osseous abnormality.  VENOUS SINUSES: The major dural venous sinuses appear patent. IMPRESSION: 1. No large vessel occlusion or aneurysm. 2. 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 Electronically signed by: Dasie Hamburg MD 01/31/2024 05:23 PM EDT RP Workstation: [id number]   CT Head Wo Contrast Result Date: 01/31/2024 CLINICAL DATA:  Head trauma with mental status change. EXAM: CT HEAD WITHOUT CONTRAST TECHNIQUE: Contiguous axial images were obtained from the base of the skull through the vertex without intravenous contrast. RADIATION DOSE REDUCTION: This exam was performed according to the departmental dose-optimization program which includes automated exposure control, adjustment of the mA and/or kV according to patient size and/or use of iterative reconstruction technique. COMPARISON:  01/14/2024 FINDINGS: Traumatic Brain Injury Risk Stratification Skull Fracture: No - Low/mBIG 1 Subdural Hematoma (SDH): Subtle amount of acute subdural hemorrhage along the posterior dependent superior and right-sided tentorium. Subarachnoid Hemorrhage [facility]): No Epidural Hematoma (EDH): No - Low/mBIG 1 Cerebral contusion, intra-axial, intraparenchymal Hemorrhage (IPH): Acute intraparenchymal hemorrhage over the posterior right temporal lobe measuring approximately 1.8 x 1.8 x 0.9 cm in AP, transverse and craniocaudal dimension. BIG 3. Intraventricular Hemorrhage (IVH): No - Low/mBIG 1 Midline Shift > 1mm or Edema/effacement of sulci/vents: No - Low/mBIG 1 ---------------------------------------------------- Brain: Ventricles and cisterns are normal. Acute intraparenchymal hemorrhage as described over the posterior right temporal lobe with mild associated adjacent edema/local mass effect. No midline shift. Subtle acute hemorrhage along the posterior interhemispheric fissure and along the right tentorium. Vascular: No hyperdense vessel or unexpected calcification.  Skull: No acute fracture. Sinuses/Orbits: No acute finding. Other: None. IMPRESSION: 1. Acute intraparenchymal hemorrhage over  the posterior right temporal lobe measuring 1.8 x 1.8 x 0.9 cm with mild associated adjacent edema/local mass effect. No midline shift. Compatible with BIG-3 classification of mTBI. 2. Subtle acute hemorrhage along the posterior interhemispheric fissure and along the right tentorium. Critical Value/emergent results were called by telephone at the time of interpretation on 01/31/2024 at 2:05 pm to provider PHILLIP STAFFORD , who verbally acknowledged these results. Electronically Signed   By: Toribio Agreste M.D.   On: 01/31/2024 14:08     Medications:     bisacodyl   10 mg Oral QHS   calcitRIOL   0.25 mcg Oral Daily   DULoxetine   30 mg Oral Daily   famotidine   10 mg Oral QHS   fluticasone  furoate-vilanterol  1 puff Inhalation Daily   melatonin  5 mg Oral QHS   metoprolol  tartrate  12.5 mg Oral BID   pantoprazole   40 mg Oral Daily   polyethylene glycol  17 g Oral BID   traZODone   75 mg Oral QHS   acetaminophen  **OR** acetaminophen , albuterol , guaiFENesin -dextromethorphan , HYDROmorphone  (DILAUDID ) injection, labetalol , ondansetron  **OR** ondansetron  (ZOFRAN ) IV, oxyCODONE , topiramate   Assessment/ Plan:  [name] is a 68 y.o.  female with past medical history of ESRD with hemodialysis, diabetes mellitus, COPD, and hypertension. Patient presents to ED from outpatient dialysis. [facility] [place]/MWF/Lt AVF   End stage renal disease on hemodialysis. Patient received dialysis for yesterday. K 5.3 Completing remainder of treatment today for 2 hours for adequate clearance. May aid in improving mentation.   2.  Decreased level of consciousness, intraparenchymal hematoma of the brain  She was found on dialysis with depressed respirations and decreased level of consciousness. In the ED CT imaging demonstrated a intraparenchymal hematoma.  Neurology and neurosurgery  has been consulted.  Mental status improved.    3. Anemia of chronic kidney disease Recent Labs       Lab Results  Component Value Date    HGB 9.5 (L) 02/01/2024      Will give low dose Retacrit  4000 units with dialysis.    4. Secondary Hyperparathyroidism: with outpatient labs: PTH 507, phosphorus 3.9, calcium  8.5 on 12/23/23.    Recent Labs       Lab Results  Component Value Date    PTH 70 (H) 05/12/2020    CALCIUM  8.6 02/01/2024    CAION 1.13 (L) 08/15/2023    PHOS 3.5 01/16/2024      Will continue to monitor bone minerals during this admission.    5. Hypoalbuminemia  Patient last albumin  3.0. Patient is on IDPN outpatient. Diet consult ordered.    LOS: 0 Simran Bomkamp P Levorn 9/20/202511:24 AM

## 2024-02-01 NOTE — Progress Notes (Signed)
 Patient tolerated treatment well, complaints of nausea post tx, prn Zofran  given. Left AVF functioned well, no prolonged bleeding post needle removal, hemostasis achieved,sites dressed with gauze/taped.   02/01/24 1300  Vitals  Temp 98.5 F (36.9 C)  Temp Source Oral  BP (!) 207/73  MAP (mmHg) 111  BP Location Right Arm  BP Method Automatic  Patient Position (if appropriate) Lying  Pulse Rate 85  Pulse Rate Source Monitor  ECG Heart Rate 84  Resp (!) 22  Oxygen Therapy  SpO2 100 %  O2 Device Nasal Cannula  O2 Flow Rate (L/min) 2 L/min  During Treatment Monitoring  Blood Flow Rate (mL/min) 0 mL/min  Arterial Pressure (mmHg) -2.42 mmHg  Venous Pressure (mmHg) -1.82 mmHg  TMP (mmHg) -52.52 mmHg  Ultrafiltration Rate (mL/min) 912 mL/min  Dialysate Flow Rate (mL/min) 0 ml/min  Duration of HD Treatment -hour(s) 2 hour(s)  Cumulative Fluid Removed (mL) per Treatment  1200.18

## 2024-02-01 NOTE — Progress Notes (Signed)
 OT Cancellation Note  Patient Details Name: Len Kluver MRN: [id number] DOB: 03/20/1956   Cancelled Treatment:    Reason Eval/Treat Not Completed: Patient at procedure or test/ unavailable. Pt noted to be off the floor for procedure for HD, unavailable at this time. Will perform OT as able at later time/date.   Marnette Perkins L. Carolynn Tuley, OTR/L  02/01/24, 11:18 AM

## 2024-02-01 NOTE — Progress Notes (Signed)
 PT Cancellation Note  Patient Details Name: [name] MRN: [id number] DOB: 08-05-1955   Cancelled Treatment:    Reason Eval/Treat Not Completed: Patient at procedure or test/unavailable Orders received, chart reviewed. Patient off unit at HD at this time. PT will re-attempt at later date/time as able.   Maryanne Finder, PT, DPT Physical Therapist - [facility]  [facility]   Pamala Hayman A Sylar Voong 02/01/2024, 11:21 AM

## 2024-02-01 NOTE — Progress Notes (Addendum)
 PROGRESS NOTE    Katie [name]  FMW:[id number] DOB: August 08, 1955 DOA: 01/31/2024 PCP: Cordella Corning, FNP    Assessment & Plan:   Principal Problem:   Intracranial hematoma following injury, without loss of consciousness, initial encounter [facility]) Active Problems:   Intraparenchymal hematoma of brain (HCC)  Assessment and Plan: Intraparenchymal hematoma of the brain: 2 falls approximately 2 weeks ago which no intraparenchymal hematoma was noted. No acute surgery needs at this time as per neuro surg. MRI brain ordered. Neuro surg following and recs apprec   ESRD: on HD. Nephro following and recs apprec  Hyperkalemia: will managed HD   HTN: continue on metoprolol    COPD: w/o exacerbation. Continue on bronchodilators   GERD: continue on PPI and pepcid   Thrombocytopenia: etiology unclear. Will continue to monitor   Macrocytic anemia: B12 427 on 01/14/24. No need for a transfusion currently      DVT prophylaxis: SCDs Code Status: DNR Family Communication:  Disposition Plan: depends on PT/OT recs   Level of care: Telemetry Medical  Status is: Observation The patient remains OBS appropriate and will d/c before 2 midnights.    Consultants:  Nephro Neuro surg   Procedures:   Antimicrobials:    Subjective: Pt c/o nausea  Objective: Vitals:   01/31/24 2105 02/01/24 0551 02/01/24 0551 02/01/24 0815  BP: (!) 110/54  (!) 129/55 (!) 112/50  Pulse: 88  85 73  Resp: 18   18  Temp: 98.5 F (36.9 C)  99.1 F (37.3 C) 98.9 F (37.2 C)  TempSrc: Oral     SpO2: 97%  90% 92%  Weight:  57.5 kg    Height:        Intake/Output Summary (Last 24 hours) at 02/01/2024 0848 Last data filed at 01/31/2024 2310 Gross per 24 hour  Intake 240 ml  Output 300 ml  Net -60 ml   Filed Weights   01/31/24 1201 02/01/24 0551  Weight: 54.9 kg 57.5 kg    Examination:  General exam: Appears calm but uncomfortable  Respiratory system: Clear to auscultation. Respiratory effort  normal. Cardiovascular system: S1 & S2+. Systolic murmur 3/6.  Gastrointestinal system: Abdomen is nondistended, soft and nontender. Normal bowel sounds heard. Central nervous system: Alert and oriented. Moves all extremities Psychiatry: Judgement and insight appears at baseline. Flat mood and affect    Data Reviewed: I have personally reviewed following labs and imaging studies  CBC: Recent Labs  Lab 01/31/24 1206 02/01/24 0535  WBC 7.2 5.1  NEUTROABS 5.3  --   HGB 10.3* 9.5*  HCT 32.8* 30.9*  MCV 102.8* 103.3*  PLT 110* 86*   Basic Metabolic Panel: Recent Labs  Lab 01/31/24 1206 02/01/24 0535  NA 139 138  K 5.0 5.3*  CL 100 100  CO2 25 21*  GLUCOSE 119* 70  BUN 93* 87*  CREATININE 5.47* 5.95*  CALCIUM  8.6* 8.6*   GFR: Estimated Creatinine Clearance: 8.6 mL/min (A) (by C-G formula based on SCr of 5.95 mg/dL (H)). Liver Function Tests: No results for input(s): AST, ALT, ALKPHOS, BILITOT, PROT, ALBUMIN  in the last 168 hours. No results for input(s): LIPASE, AMYLASE in the last 168 hours. No results for input(s): AMMONIA in the last 168 hours. Coagulation Profile: No results for input(s): INR, PROTIME in the last 168 hours. Cardiac Enzymes: No results for input(s): CKTOTAL, CKMB, CKMBINDEX, TROPONINI in the last 168 hours. BNP (last 3 results) No results for input(s): PROBNP in the last 8760 hours. HbA1C: No results for input(s): HGBA1C in the  last 72 hours. CBG: No results for input(s): GLUCAP in the last 168 hours. Lipid Profile: No results for input(s): CHOL, HDL, LDLCALC, TRIG, CHOLHDL, LDLDIRECT in the last 72 hours. Thyroid Function Tests: No results for input(s): TSH, T4TOTAL, FREET4, T3FREE, THYROIDAB in the last 72 hours. Anemia Panel: No results for input(s): VITAMINB12, FOLATE, FERRITIN, TIBC, IRON, RETICCTPCT in the last 72 hours. Sepsis Labs: No results for input(s):  PROCALCITON, LATICACIDVEN in the last 168 hours.  Recent Results (from the past 240 hours)  Resp panel by RT-PCR (RSV, Flu A&B, Covid) Anterior Nasal Swab     Status: None   Collection Time: 01/24/24  5:31 PM   Specimen: Anterior Nasal Swab  Result Value Ref Range Status   SARS Coronavirus 2 by RT PCR NEGATIVE NEGATIVE Final    Comment: (NOTE) SARS-CoV-2 target nucleic acids are NOT DETECTED.  The SARS-CoV-2 RNA is generally detectable in upper respiratory specimens during the acute phase of infection. The lowest concentration of SARS-CoV-2 viral copies this assay can detect is 138 copies/mL. A negative result does not preclude SARS-Cov-2 infection and should not be used as the sole basis for treatment or other patient management decisions. A negative result may occur with  improper specimen collection/handling, submission of specimen other than nasopharyngeal swab, presence of viral mutation(s) within the areas targeted by this assay, and inadequate number of viral copies(<138 copies/mL). A negative result must be combined with clinical observations, patient history, and epidemiological information. The expected result is Negative.  Fact Sheet for Patients:  BloggerCourse.com  Fact Sheet for Healthcare Providers:  SeriousBroker.it  This test is no t yet approved or cleared by the [place]  FDA and  has been authorized for detection and/or diagnosis of SARS-CoV-2 by FDA under an Emergency Use Authorization (EUA). This EUA will remain  in effect (meaning this test can be used) for the duration of the COVID-19 declaration under Section 564(b)(1) of the Act, 21 U.S.C.section 360bbb-3(b)(1), unless the authorization is terminated  or revoked sooner.       Influenza A by PCR NEGATIVE NEGATIVE Final   Influenza B by PCR NEGATIVE NEGATIVE Final    Comment: (NOTE) The Xpert Xpress SARS-CoV-2/FLU/RSV plus assay is intended as  an aid in the diagnosis of influenza from Nasopharyngeal swab specimens and should not be used as a sole basis for treatment. Nasal washings and aspirates are unacceptable for Xpert Xpress SARS-CoV-2/FLU/RSV testing.  Fact Sheet for Patients: BloggerCourse.com  Fact Sheet for Healthcare Providers: SeriousBroker.it  This test is not yet approved or cleared by the [place]  FDA and has been authorized for detection and/or diagnosis of SARS-CoV-2 by FDA under an Emergency Use Authorization (EUA). This EUA will remain in effect (meaning this test can be used) for the duration of the COVID-19 declaration under Section 564(b)(1) of the Act, 21 U.S.C. section 360bbb-3(b)(1), unless the authorization is terminated or revoked.     Resp Syncytial Virus by PCR NEGATIVE NEGATIVE Final    Comment: (NOTE) Fact Sheet for Patients: BloggerCourse.com  Fact Sheet for Healthcare Providers: SeriousBroker.it  This test is not yet approved or cleared by the [place]  FDA and has been authorized for detection and/or diagnosis of SARS-CoV-2 by FDA under an Emergency Use Authorization (EUA). This EUA will remain in effect (meaning this test can be used) for the duration of the COVID-19 declaration under Section 564(b)(1) of the Act, 21 U.S.C. section 360bbb-3(b)(1), unless the authorization is terminated or revoked.  Performed at [facility], [address] [place], [place] [address]          Radiology Studies: CT HEAD WO CONTRAST ( ) Result Date: 01/31/2024 EXAM: CT HEAD WITHOUT CONTRAST 01/31/2024 08:25:41 PM TECHNIQUE: CT of the head was performed without the administration of intravenous contrast. Automated exposure control, iterative reconstruction, and/or weight based adjustment of the mA/kV was utilized to reduce the radiation dose to as low as reasonably achievable.  COMPARISON: 01/31/2024 01:40:00 PM CLINICAL HISTORY: Follow up [facility]. FINDINGS: BRAIN AND VENTRICLES: Unchanged appearance of intraparenchymal hematoma in the right posterior temporal lobe. There is a small subdural hematoma along the left anterior convexity that was not visible on the prior scan. This measures 4 mm in thickness. No mass effect or midline shift. Mild volume loss. ORBITS: No acute abnormality. SINUSES: No acute abnormality. SOFT TISSUES AND SKULL: No acute soft tissue abnormality. No skull fracture. VASCULATURE: ICA atherosclerosis. IMPRESSION: 1. Unchanged intraparenchymal hematoma in the right posterior temporal lobe. 2. Small left anterior convexity subdural hematoma measuring 4 mm, not visible on prior, but the relatively low density suggests that it may be subacute. Short interval follow-up recommended. Electronically signed by: Franky Stanford MD 01/31/2024 08:31 PM EDT RP Workstation: [id number]   CT Angio Head W or Wo Contrast Result Date: 01/31/2024 EXAM: CTA Head without and with Intravenous Contrast CLINICAL HISTORY: Cerebral aneurysm screening, high-risk. TECHNIQUE: Axial CTA images of the head without and with intravenous contrast. MIP reconstructed images were created and reviewed. Dose reduction technique was used including one or more of the following: automated exposure control, adjustment of mA and kV according to patient size, and/or iterative reconstruction. CONTRAST: 75mL (iohexol  (OMNIPAQUE ) 350 MG/ML injection 75 mL IOHEXOL  350 MG/ML SOLN) COMPARISON: MRA Head 06/25/2020 FINDINGS: INTERNAL CAROTID ARTERIES: The internal carotid arteries are patent from the included distal cervical segments through the termini with atherosclerotic calcifications resulting in mild cavernous and proximal supraclinoid stenoses bilaterally. ANTERIOR CEREBRAL ARTERIES: ACAs are patent without evidence of a proximal branch occlusion or significant proximal stenosis. MIDDLE CEREBRAL ARTERIES: MCAs are  patent without evidence of a proximal branch occlusion or significant proximal stenosis. POSTERIOR CEREBRAL ARTERIES: Both PCAs are patent without evidence of any significant proximal stenosis. There are robust posterior communicating arteries bilaterally with hypoplasia of the right greater than left P1 segments. BASILAR ARTERY: The basilar artery is widely patent. VERTEBRAL ARTERIES: The included distal vertebral arteries are patent to the basilar with the right being strongly dominant. Atherosclerotic calcification results in moderate to severe stenoses of the distal left V3 and V4 segments. The majority of the left V4 segment is now diffusely small in caliber which reflects a change from the prior MRA. No aneurysm or vascular malformation is identified. SOFT TISSUES: No acute finding. BONES: No acute osseous abnormality. VENOUS SINUSES: The major dural venous sinuses appear patent. IMPRESSION: 1. No large vessel occlusion or aneurysm. 2. 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 Electronically signed by: Dasie Hamburg MD 01/31/2024 05:23 PM EDT RP Workstation: [id number]   CT Head Wo Contrast Result Date: 01/31/2024 CLINICAL DATA:  Head trauma with mental status change. EXAM: CT HEAD WITHOUT CONTRAST TECHNIQUE: Contiguous axial images were obtained from the base of the skull through the vertex without intravenous contrast. RADIATION DOSE REDUCTION: This exam was performed according to the departmental dose-optimization program which includes automated exposure control, adjustment of the mA and/or kV according to patient size and/or use of iterative reconstruction technique. COMPARISON:  01/14/2024 FINDINGS: Traumatic Brain  Injury Risk Stratification Skull Fracture: No - Low/mBIG 1 Subdural Hematoma (SDH): Subtle amount of acute subdural hemorrhage along the posterior dependent superior and right-sided tentorium. Subarachnoid  Hemorrhage [facility]): No Epidural Hematoma (EDH): No - Low/mBIG 1 Cerebral contusion, intra-axial, intraparenchymal Hemorrhage (IPH): Acute intraparenchymal hemorrhage over the posterior right temporal lobe measuring approximately 1.8 x 1.8 x 0.9 cm in AP, transverse and craniocaudal dimension. BIG 3. Intraventricular Hemorrhage (IVH): No - Low/mBIG 1 Midline Shift > 1mm or Edema/effacement of sulci/vents: No - Low/mBIG 1 ---------------------------------------------------- Brain: Ventricles and cisterns are normal. Acute intraparenchymal hemorrhage as described over the posterior right temporal lobe with mild associated adjacent edema/local mass effect. No midline shift. Subtle acute hemorrhage along the posterior interhemispheric fissure and along the right tentorium. Vascular: No hyperdense vessel or unexpected calcification. Skull: No acute fracture. Sinuses/Orbits: No acute finding. Other: None. IMPRESSION: 1. Acute intraparenchymal hemorrhage over the posterior right temporal lobe measuring 1.8 x 1.8 x 0.9 cm with mild associated adjacent edema/local mass effect. No midline shift. Compatible with BIG-3 classification of mTBI. 2. Subtle acute hemorrhage along the posterior interhemispheric fissure and along the right tentorium. Critical Value/emergent results were called by telephone at the time of interpretation on 01/31/2024 at 2:05 pm to provider PHILLIP STAFFORD , who verbally acknowledged these results. Electronically Signed   By: Toribio Agreste M.D.   On: 01/31/2024 14:08        Scheduled Meds:  bisacodyl   10 mg Oral QHS   calcitRIOL   0.25 mcg Oral Daily   DULoxetine   30 mg Oral Daily   famotidine   10 mg Oral QHS   fluticasone  furoate-vilanterol  1 puff Inhalation Daily   melatonin  5 mg Oral QHS   metoprolol  tartrate  12.5 mg Oral BID   pantoprazole   40 mg Oral Daily   polyethylene glycol  17 g Oral BID   traZODone   75 mg Oral QHS   Continuous Infusions:   LOS: 0 days       Anthony CHRISTELLA Pouch, MD [facility] Pager [phone]  If 7PM-7AM, please contact night-coverage [email or web address]  02/01/2024, 8:48 AM

## 2024-02-01 NOTE — Progress Notes (Incomplete)
 Katie [name]

## 2024-02-01 NOTE — Care Management Obs Status (Signed)
 MEDICARE OBSERVATION STATUS NOTIFICATION   Patient Details  Name: Safira Proffit MRN: [id number] Date of Birth: 1956/01/19   Medicare Observation Status Notification Given:  Yes    [name] 02/01/2024, 1:09 PM

## 2024-02-01 NOTE — Plan of Care (Signed)
 Pt's first night on unit.  Will assess and update plan of care as needed.  Problem: Education: Goal: Knowledge of General Education information will improve Description: Including pain rating scale, medication(s)/side effects and non-pharmacologic comfort measures Outcome: Progressing   Problem: Health Behavior/Discharge Planning: Goal: Ability to manage health-related needs will improve Outcome: Progressing   Problem: Clinical Measurements: Goal: Ability to maintain clinical measurements within normal limits will improve Outcome: Progressing Goal: Will remain free from infection Outcome: Progressing Goal: Diagnostic test results will improve Outcome: Progressing Goal: Respiratory complications will improve Outcome: Progressing Goal: Cardiovascular complication will be avoided Outcome: Progressing   Problem: Activity: Goal: Risk for activity intolerance will decrease Outcome: Progressing   Problem: Nutrition: Goal: Adequate nutrition will be maintained Outcome: Progressing   Problem: Coping: Goal: Level of anxiety will decrease Outcome: Progressing   Problem: Elimination: Goal: Will not experience complications related to bowel motility Outcome: Progressing Goal: Will not experience complications related to urinary retention Outcome: Progressing   Problem: Pain Managment: Goal: General experience of comfort will improve and/or be controlled Outcome: Progressing   Problem: Safety: Goal: Ability to remain free from injury will improve Outcome: Progressing   Problem: Skin Integrity: Goal: Risk for impaired skin integrity will decrease Outcome: Progressing

## 2024-02-02 DIAGNOSIS — S062X0A Diffuse traumatic brain injury without loss of consciousness, initial encounter: Secondary | ICD-10-CM | POA: Diagnosis not present

## 2024-02-02 LAB — COMPREHENSIVE METABOLIC PANEL WITH GFR
ALT: 131 U/L — ABNORMAL HIGH (ref 0–44)
AST: 91 U/L — ABNORMAL HIGH (ref 15–41)
Albumin: 2.6 g/dL — ABNORMAL LOW (ref 3.5–5.0)
Alkaline Phosphatase: 88 U/L (ref 38–126)
Anion gap: 9 (ref 5–15)
BUN: 59 mg/dL — ABNORMAL HIGH (ref 8–23)
CO2: 28 mmol/L (ref 22–32)
Calcium: 8.4 mg/dL — ABNORMAL LOW (ref 8.9–10.3)
Chloride: 99 mmol/L (ref 98–111)
Creatinine, Ser: 4.62 mg/dL — ABNORMAL HIGH (ref 0.44–1.00)
GFR, Estimated: 10 mL/min — ABNORMAL LOW (ref >60)
Glucose, Bld: 92 mg/dL (ref 70–99)
Potassium: 4.2 mmol/L (ref 3.5–5.1)
Sodium: 136 mmol/L (ref 135–145)
Total Bilirubin: 0.7 mg/dL (ref 0.0–1.2)
Total Protein: 5.5 g/dL — ABNORMAL LOW (ref 6.5–8.1)

## 2024-02-02 LAB — CBC
HCT: 28 % — ABNORMAL LOW (ref 36.0–46.0)
Hemoglobin: 8.9 g/dL — ABNORMAL LOW (ref 12.0–15.0)
MCH: 32.5 pg (ref 26.0–34.0)
MCHC: 31.8 g/dL (ref 30.0–36.0)
MCV: 102.2 fL — ABNORMAL HIGH (ref 80.0–100.0)
Platelets: 79 K/uL — ABNORMAL LOW (ref 150–400)
RBC: 2.74 MIL/uL — ABNORMAL LOW (ref 3.87–5.11)
RDW: 13.5 % (ref 11.5–15.5)
WBC: 6.5 K/uL (ref 4.0–10.5)
nRBC: 0 % (ref 0.0–0.2)

## 2024-02-02 MED ORDER — MIDODRINE HCL 5 MG PO TABS
10.0000 mg | ORAL_TABLET | ORAL | Status: DC | PRN
  Administered 2024-02-03: 10 mg via ORAL
  Filled 2024-02-02: qty 2

## 2024-02-02 MED ORDER — SEVELAMER CARBONATE 800 MG PO TABS
800.0000 mg | ORAL_TABLET | Freq: Three times a day (TID) | ORAL | Status: DC
  Administered 2024-02-02 – 2024-02-04 (×5): 800 mg via ORAL
  Filled 2024-02-02 (×6): qty 1

## 2024-02-02 MED ORDER — SODIUM CHLORIDE 0.9 % IV SOLN
1.0000 g | INTRAVENOUS | Status: DC
  Administered 2024-02-02 – 2024-02-04 (×3): 1 g via INTRAVENOUS
  Filled 2024-02-02 (×3): qty 10

## 2024-02-02 MED ORDER — PROSOURCE PLUS PO LIQD
30.0000 mL | Freq: Two times a day (BID) | ORAL | Status: DC
  Administered 2024-02-02 – 2024-02-04 (×3): 30 mL via ORAL

## 2024-02-02 MED ORDER — RENA-VITE PO TABS
1.0000 | ORAL_TABLET | Freq: Every day | ORAL | Status: DC
  Administered 2024-02-02 – 2024-02-03 (×2): 1 via ORAL
  Filled 2024-02-02 (×3): qty 1

## 2024-02-02 NOTE — Progress Notes (Signed)
 Initial Nutrition Assessment  INTERVENTION:   -Rena-Vit daily  -Prosource Plus PO BID, each provides 100 kcals and 15g protein   -Placed Nutrition for Dialysis handout in AVS  NUTRITION DIAGNOSIS:   Increased nutrient needs related to chronic illness, acute illness as evidenced by estimated needs.  GOAL:   Patient will meet greater than or equal to 90% of their needs  MONITOR:   PO intake, Supplement acceptance  REASON FOR ASSESSMENT:   Consult Assessment of nutrition requirement/status  ASSESSMENT:   68 y.o.  female with past medical history of ESRD with hemodialysis, diabetes mellitus, COPD, and hypertension. Patient presents to ED from outpatient dialysis for decreased level of conscience, intraparenchymal hematoma of the brain.  Patient with ESRD on dialysis, MWF schedule. Last HD was 9/20. Target weight for HD is ~55 kg (121 lbs). Patient is currently consuming 100% of meals. Will add daily renal MVI as well as protein supplements.  Admission weight: 121 lbs Current weight: 126 lbs  Medications: Calcitriol , Pepcid , Miralax , Renvela   Labs reviewed: CBGs: 101   NUTRITION - FOCUSED PHYSICAL EXAM:  Unable to complete, working remote  Diet Order:   Diet Order             Diet 2 gram sodium Room service appropriate? Yes; Fluid consistency: Thin; Fluid restriction: 1800 mL Fluid  Diet effective now                   EDUCATION NEEDS:      Skin:  Skin Assessment: Skin Integrity Issues: Skin Integrity Issues:: Stage II, Other (Comment) Stage II: left coccyx Other: traumatic right elbow wound  Last BM:  9/21 -type 6  Height:   Ht Readings from Last 1 Encounters:  01/31/24 5' 6 (1.676 m)    Weight:   Wt Readings from Last 1 Encounters:  02/01/24 57.2 kg    BMI:  Body mass index is 20.35 kg/m.  Estimated Nutritional Needs:   Kcal:  2000-2200  Protein:  90-110g  Fluid:  1L +UOP   Morna Lee, MS, RD, LDN Inpatient Clinical  Dietitian Contact via Secure chat

## 2024-02-02 NOTE — Progress Notes (Addendum)
 PROGRESS NOTE    Katie [name]  FMW:[id number] DOB: 01-11-1956 DOA: 01/31/2024 PCP: Cordella Corning, FNP    Assessment & Plan:   Principal Problem:   Intracranial hematoma following injury, without loss of consciousness, initial encounter [facility]) Active Problems:   Intraparenchymal hematoma of brain (HCC)  Assessment and Plan: Intraparenchymal hematoma of the brain: recent fall within last week as per pt today. No acute surgery needs at this time as per neuro surg. MRI brain shows stable intraparenchymal hematoma, unchanged small left frontal subdural hematoma & trace subdural blood at the posterior right convexity, not visible by CT. Will need to f/u outpatient w/ neuro surg in 2 weeks for repeat CT. Neuro recs apprec    ESRD: on HD. Nephro following and recs apprec   Hyperkalemia: WNL today    HTN: continue on metoprolol     COPD: w/o exacerbation. Continue on bronchodilators    GERD: continue on pepcid , PPI   Thrombocytopenia: etiology unclear, trending down. Will continue to monitor   Macrocytic anemia: B12 427 on 01/14/24. Will transfuse if Hb < 7.0      DVT prophylaxis: SCDs Code Status: DNR Family Communication:  Disposition Plan: likely d/c to SNF  Level of care: Telemetry Medical  Status is: Observation The patient remains OBS appropriate and will d/c before 2 midnights.    Consultants:  Nephro Neuro surg   Procedures:   Antimicrobials:    Subjective: Pt c/o fatigue   Objective: Vitals:   02/01/24 2054 02/02/24 0032 02/02/24 0535 02/02/24 0744  BP: (!) 173/69 (!) 115/52 (!) 158/51 (!) 138/54  Pulse: 71 60 61 61  Resp: 16  16 18   Temp: 98.3 F (36.8 C) 98.4 F (36.9 C) 98.2 F (36.8 C) 98.1 F (36.7 C)  TempSrc:    Oral  SpO2:  100% 100% 100%  Weight:      Height:        Intake/Output Summary (Last 24 hours) at 02/02/2024 0832 Last data filed at 02/01/2024 2324 Gross per 24 hour  Intake 720 ml  Output --  Net 720 ml   Filed Weights    01/31/24 1201 02/01/24 0551 02/01/24 1028  Weight: 54.9 kg 57.5 kg 57.2 kg    Examination:  General exam: appears comfortable & calm  Respiratory system: decreased breath sounds b/l  Cardiovascular system: S1/S2+. Systolic murmur 3/6.  Gastrointestinal system: abd is soft, NT, ND & hypoactive bowel sounds. Central nervous system: alert & oriented. Moves all extremities  Psychiatry: Judgement and insight appears at baseline. Flat mood and affect     Data Reviewed: I have personally reviewed following labs and imaging studies  CBC: Recent Labs  Lab 01/31/24 1206 02/01/24 0535 02/02/24 0316  WBC 7.2 5.1 6.5  NEUTROABS 5.3  --   --   HGB 10.3* 9.5* 8.9*  HCT 32.8* 30.9* 28.0*  MCV 102.8* 103.3* 102.2*  PLT 110* 86* 79*   Basic Metabolic Panel: Recent Labs  Lab 01/31/24 1206 02/01/24 0535 02/02/24 0316  NA 139 138 136  K 5.0 5.3* 4.2  CL 100 100 99  CO2 25 21* 28  GLUCOSE 119* 70 92  BUN 93* 87* 59*  CREATININE 5.47* 5.95* 4.62*  CALCIUM  8.6* 8.6* 8.4*  PHOS  --  6.2*  --    GFR: Estimated Creatinine Clearance: 11 mL/min (A) (by C-G formula based on SCr of 4.62 mg/dL (H)). Liver Function Tests: Recent Labs  Lab 02/02/24 0316  AST 91*  ALT 131*  ALKPHOS 88  BILITOT  0.7  PROT 5.5*  ALBUMIN  2.6*   No results for input(s): LIPASE, AMYLASE in the last 168 hours. No results for input(s): AMMONIA in the last 168 hours. Coagulation Profile: No results for input(s): INR, PROTIME in the last 168 hours. Cardiac Enzymes: No results for input(s): CKTOTAL, CKMB, CKMBINDEX, TROPONINI in the last 168 hours. BNP (last 3 results) No results for input(s): PROBNP in the last 8760 hours. HbA1C: No results for input(s): HGBA1C in the last 72 hours. CBG: Recent Labs  Lab 02/01/24 2201  GLUCAP 101*   Lipid Profile: No results for input(s): CHOL, HDL, LDLCALC, TRIG, CHOLHDL, LDLDIRECT in the last 72 hours. Thyroid Function Tests: No  results for input(s): TSH, T4TOTAL, FREET4, T3FREE, THYROIDAB in the last 72 hours. Anemia Panel: No results for input(s): VITAMINB12, FOLATE, FERRITIN, TIBC, IRON, RETICCTPCT in the last 72 hours. Sepsis Labs: No results for input(s): PROCALCITON, LATICACIDVEN in the last 168 hours.  Recent Results (from the past 240 hours)  Resp panel by RT-PCR (RSV, Flu A&B, Covid) Anterior Nasal Swab     Status: None   Collection Time: 01/24/24  5:31 PM   Specimen: Anterior Nasal Swab  Result Value Ref Range Status   SARS Coronavirus 2 by RT PCR NEGATIVE NEGATIVE Final    Comment: (NOTE) SARS-CoV-2 target nucleic acids are NOT DETECTED.  The SARS-CoV-2 RNA is generally detectable in upper respiratory specimens during the acute phase of infection. The lowest concentration of SARS-CoV-2 viral copies this assay can detect is 138 copies/mL. A negative result does not preclude SARS-Cov-2 infection and should not be used as the sole basis for treatment or other patient management decisions. A negative result may occur with  improper specimen collection/handling, submission of specimen other than nasopharyngeal swab, presence of viral mutation(s) within the areas targeted by this assay, and inadequate number of viral copies(<138 copies/mL). A negative result must be combined with clinical observations, patient history, and epidemiological information. The expected result is Negative.  Fact Sheet for Patients:  BloggerCourse.com  Fact Sheet for Healthcare Providers:  SeriousBroker.it  This test is no t yet approved or cleared by the [place]  FDA and  has been authorized for detection and/or diagnosis of SARS-CoV-2 by FDA under an Emergency Use Authorization (EUA). This EUA will remain  in effect (meaning this test can be used) for the duration of the COVID-19 declaration under Section 564(b)(1) of the Act,  21 U.S.C.section 360bbb-3(b)(1), unless the authorization is terminated  or revoked sooner.       Influenza A by PCR NEGATIVE NEGATIVE Final   Influenza B by PCR NEGATIVE NEGATIVE Final    Comment: (NOTE) The Xpert Xpress SARS-CoV-2/FLU/RSV plus assay is intended as an aid in the diagnosis of influenza from Nasopharyngeal swab specimens and should not be used as a sole basis for treatment. Nasal washings and aspirates are unacceptable for Xpert Xpress SARS-CoV-2/FLU/RSV testing.  Fact Sheet for Patients: BloggerCourse.com  Fact Sheet for Healthcare Providers: SeriousBroker.it  This test is not yet approved or cleared by the [place]  FDA and has been authorized for detection and/or diagnosis of SARS-CoV-2 by FDA under an Emergency Use Authorization (EUA). This EUA will remain in effect (meaning this test can be used) for the duration of the COVID-19 declaration under Section 564(b)(1) of the Act, 21 U.S.C. section 360bbb-3(b)(1), unless the authorization is terminated or revoked.     Resp Syncytial Virus by PCR NEGATIVE NEGATIVE Final    Comment: (NOTE) Fact Sheet for Patients: BloggerCourse.com  Fact  Sheet for Healthcare Providers: SeriousBroker.it  This test is not yet approved or cleared by the [place]  FDA and has been authorized for detection and/or diagnosis of SARS-CoV-2 by FDA under an Emergency Use Authorization (EUA). This EUA will remain in effect (meaning this test can be used) for the duration of the COVID-19 declaration under Section 564(b)(1) of the Act, 21 U.S.C. section 360bbb-3(b)(1), unless the authorization is terminated or revoked.  Performed at [facility], [address] [place], [place] [address]   Urine Culture     Status: Abnormal (Preliminary result)   Collection Time: 01/31/24 12:06 PM   Specimen: Urine, Random  Result  Value Ref Range Status   Specimen Description   Final    URINE, RANDOM Performed at [facility], [address] [place], [place] [address]    Special Requests   Final    NONE Reflexed from [id number] Performed at [facility], [address] [place], [place] [address]    Culture (A)  Final    50,000 COLONIES/mL STREPTOCOCCUS MITIS/ORALIS CULTURE REINCUBATED FOR BETTER GROWTH Performed at [facility], [address] [place], [place] [address]    Report Status PENDING  Incomplete         Radiology Studies: MR BRAIN WO CONTRAST Result Date: 02/01/2024 EXAM: MRI BRAIN WITHOUT CONTRAST 02/01/2024 07:39:00 PM TECHNIQUE: Multiplanar multisequence MRI of the head/brain was performed without the administration of intravenous contrast. COMPARISON: Head CT 01/31/2024. CLINICAL HISTORY: Subdural hematoma; also intraparenchymal hematoma. Intracranial hematoma following injury, without loss of consciousness, initial encounter. Intraparenchymal hematoma of brain. 2 falls approximately 2 weeks ago which no intraparenchymal hematoma was noted. No acute surgery needs at this time as per neuro surg. MRI brain ordered. Neuro surg following and recs apprec. FINDINGS: BRAIN AND VENTRICLES: Intraparenchymal hematoma in the inferior right temporal lobe, unchanged in appearance compared to the prior CT. Small left frontal subdural hematoma, also unchanged. Small amount of subdural blood along the falx cerebri, stable. Trace subdural blood at the posterior right convexity, not visible by CT. No acute infarct. No midline shift. No hydrocephalus. ORBITS: Chronic blood products in the posterior left globe. SINUSES AND MASTOIDS: No acute abnormality. BONES AND SOFT TISSUES: Normal marrow signal. No acute soft tissue abnormality. IMPRESSION: 1. Unchanged intraparenchymal hematoma in the inferior right temporal lobe. 2. Unchanged small left frontal subdural hematoma. 3. Trace subdural blood at the  posterior right convexity, not visible by CT. Electronically signed by: Franky Stanford MD 02/01/2024 08:13 PM EDT RP Workstation: [id number]   DG Abd 1 View Result Date: 02/01/2024 CLINICAL DATA:  352040 Encounter for imaging to screen for metal prior to magnetic resonance imaging (MRI) 352040. EXAM: ABDOMEN - 1 VIEW COMPARISON:  10/29/2021. FINDINGS: The bowel gas pattern is non-obstructive. No evidence of pneumoperitoneum. There is subacute slightly displaced fracture of the lateral aspect of right sixth rib. No other acute osseous abnormalities. The soft tissues are within normal limits. Surgical changes, devices, tubes and lines: None. No metallic foreign body. IMPRESSION: 1. No metallic foreign body. 2. Subacute slightly displaced fracture of the lateral aspect of right sixth rib. Electronically Signed   By: Ree Molt M.D.   On: 02/01/2024 17:59   DG Chest Port 1 View Result Date: 02/01/2024 CLINICAL DATA:  [id number] Encounter for imaging to screen for metal prior to magnetic resonance imaging (MRI) 352040. EXAM: PORTABLE CHEST 1 VIEW COMPARISON:  08/15/2023. FINDINGS: Stable mild prominence of interstitial markings. No frank pulmonary edema. Bilateral lung fields  are otherwise clear. There is subtle blunting bilateral lateral costophrenic angles, which may represent trace pleural effusions. No pneumothorax on either side. Stable cardio-mediastinal silhouette. No acute osseous abnormalities. The soft tissues are within normal limits. No metallic foreign body. IMPRESSION: No metallic foreign body. Electronically Signed   By: Ree Molt M.D.   On: 02/01/2024 17:56   CT HEAD WO CONTRAST ( ) Result Date: 01/31/2024 EXAM: CT HEAD WITHOUT CONTRAST 01/31/2024 08:25:41 PM TECHNIQUE: CT of the head was performed without the administration of intravenous contrast. Automated exposure control, iterative reconstruction, and/or weight based adjustment of the mA/kV was utilized to reduce the radiation dose to  as low as reasonably achievable. COMPARISON: 01/31/2024 01:40:00 PM CLINICAL HISTORY: Follow up ICH. FINDINGS: BRAIN AND VENTRICLES: Unchanged appearance of intraparenchymal hematoma in the right posterior temporal lobe. There is a small subdural hematoma along the left anterior convexity that was not visible on the prior scan. This measures 4 mm in thickness. No mass effect or midline shift. Mild volume loss. ORBITS: No acute abnormality. SINUSES: No acute abnormality. SOFT TISSUES AND SKULL: No acute soft tissue abnormality. No skull fracture. VASCULATURE: ICA atherosclerosis. IMPRESSION: 1. Unchanged intraparenchymal hematoma in the right posterior temporal lobe. 2. Small left anterior convexity subdural hematoma measuring 4 mm, not visible on prior, but the relatively low density suggests that it may be subacute. Short interval follow-up recommended. Electronically signed by: Franky Stanford MD 01/31/2024 08:31 PM EDT RP Workstation: [id number]   CT Angio Head W or Wo Contrast Result Date: 01/31/2024 EXAM: CTA Head without and with Intravenous Contrast CLINICAL HISTORY: Cerebral aneurysm screening, high-risk. TECHNIQUE: Axial CTA images of the head without and with intravenous contrast. MIP reconstructed images were created and reviewed. Dose reduction technique was used including one or more of the following: automated exposure control, adjustment of mA and kV according to patient size, and/or iterative reconstruction. CONTRAST: 75mL (iohexol  (OMNIPAQUE ) 350 MG/ML injection 75 mL IOHEXOL  350 MG/ML SOLN) COMPARISON: MRA Head 06/25/2020 FINDINGS: INTERNAL CAROTID ARTERIES: The internal carotid arteries are patent from the included distal cervical segments through the termini with atherosclerotic calcifications resulting in mild cavernous and proximal supraclinoid stenoses bilaterally. ANTERIOR CEREBRAL ARTERIES: ACAs are patent without evidence of a proximal branch occlusion or significant proximal stenosis.  MIDDLE CEREBRAL ARTERIES: MCAs are patent without evidence of a proximal branch occlusion or significant proximal stenosis. POSTERIOR CEREBRAL ARTERIES: Both PCAs are patent without evidence of any significant proximal stenosis. There are robust posterior communicating arteries bilaterally with hypoplasia of the right greater than left P1 segments. BASILAR ARTERY: The basilar artery is widely patent. VERTEBRAL ARTERIES: The included distal vertebral arteries are patent to the basilar with the right being strongly dominant. Atherosclerotic calcification results in moderate to severe stenoses of the distal left V3 and V4 segments. The majority of the left V4 segment is now diffusely small in caliber which reflects a change from the prior MRA. No aneurysm or vascular malformation is identified. SOFT TISSUES: No acute finding. BONES: No acute osseous abnormality. VENOUS SINUSES: The major dural venous sinuses appear patent. IMPRESSION: 1. No large vessel occlusion or aneurysm. 2. 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 Electronically signed by: Dasie Hamburg MD 01/31/2024 05:23 PM EDT RP Workstation: [id number]   CT Head Wo Contrast Result Date: 01/31/2024 CLINICAL DATA:  Head trauma with mental status change. EXAM: CT HEAD WITHOUT CONTRAST TECHNIQUE: Contiguous axial images were obtained from the base of the skull through  the vertex without intravenous contrast. RADIATION DOSE REDUCTION: This exam was performed according to the departmental dose-optimization program which includes automated exposure control, adjustment of the mA and/or kV according to patient size and/or use of iterative reconstruction technique. COMPARISON:  01/14/2024 FINDINGS: Traumatic Brain Injury Risk Stratification Skull Fracture: No - Low/mBIG 1 Subdural Hematoma (SDH): Subtle amount of acute subdural hemorrhage along the posterior dependent superior and  right-sided tentorium. Subarachnoid Hemorrhage [facility]): No Epidural Hematoma (EDH): No - Low/mBIG 1 Cerebral contusion, intra-axial, intraparenchymal Hemorrhage (IPH): Acute intraparenchymal hemorrhage over the posterior right temporal lobe measuring approximately 1.8 x 1.8 x 0.9 cm in AP, transverse and craniocaudal dimension. BIG 3. Intraventricular Hemorrhage (IVH): No - Low/mBIG 1 Midline Shift > 1mm or Edema/effacement of sulci/vents: No - Low/mBIG 1 ---------------------------------------------------- Brain: Ventricles and cisterns are normal. Acute intraparenchymal hemorrhage as described over the posterior right temporal lobe with mild associated adjacent edema/local mass effect. No midline shift. Subtle acute hemorrhage along the posterior interhemispheric fissure and along the right tentorium. Vascular: No hyperdense vessel or unexpected calcification. Skull: No acute fracture. Sinuses/Orbits: No acute finding. Other: None. IMPRESSION: 1. Acute intraparenchymal hemorrhage over the posterior right temporal lobe measuring 1.8 x 1.8 x 0.9 cm with mild associated adjacent edema/local mass effect. No midline shift. Compatible with BIG-3 classification of mTBI. 2. Subtle acute hemorrhage along the posterior interhemispheric fissure and along the right tentorium. Critical Value/emergent results were called by telephone at the time of interpretation on 01/31/2024 at 2:05 pm to provider PHILLIP STAFFORD , who verbally acknowledged these results. Electronically Signed   By: Toribio Agreste M.D.   On: 01/31/2024 14:08        Scheduled Meds:  bisacodyl   10 mg Oral QHS   calcitRIOL   0.25 mcg Oral Daily   DULoxetine   30 mg Oral Daily   [START ON 02/03/2024] epoetin  alfa-epbx (RETACRIT ) injection  4,000 Units Intravenous Q M,W,F-1800   famotidine   10 mg Oral QHS   fluticasone  furoate-vilanterol  1 puff Inhalation Daily   melatonin  5 mg Oral QHS   metoprolol  tartrate  12.5 mg Oral BID   pantoprazole   40 mg Oral  Daily   polyethylene glycol  17 g Oral BID   traZODone   75 mg Oral QHS   Continuous Infusions:  cefTRIAXone  (ROCEPHIN )  IV       LOS: 0 days       Anthony CHRISTELLA Pouch, MD [facility] Pager [phone]  If 7PM-7AM, please contact night-coverage [email or web address]  02/02/2024, 8:32 AM

## 2024-02-02 NOTE — Evaluation (Signed)
 Occupational Therapy Evaluation Patient Details Name: Katie [name] MRN: [id number] DOB: 20-Feb-1956 Today's Date: 02/02/2024   History of Present Illness   68 y/o female presented to ED on 01/31/24 from dialysis for decreased LOC and decreased respiratory rate. CT showed acute intraparenchymal hemorrhage over posterior R temporal lobe. No expansion noted with additional head CT. PMH: chronic diastolic heart failure, COPD, U7IF, ESRD on HD, HTN, severe aortic stenosis.     Clinical Impressions Pt admitted with above. Prior to admission, pt lives at [facility] assisted living and was mod independent using RW to ambulate to/from dining room for meals; mod independent for ADLs (pt reports staff supervises for showers). 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 Pt requires MIN A for lunch tray setup for orientation and container mgmt, functional STS and step pivot transfers with RW and MIN - MOD A. Pt benefits from multimodal cuing for hand placement, and step-by-step sequencing as pt leans posteriorly while transitioning upright, and takes small shuffled steps to recliner. MOD A to control descent to recliner as pt with posterior sway and does not attempt to shift weight appropriately as cued, corrects on return back to bed with MIN A. Pt would benefit from skilled OT services to address noted impairments and functional limitations (see below for any additional details) in order to maximize safety and independence while minimizing falls risk and caregiver burden. Anticipate the need for follow up OT services upon acute hospital DC.      If plan is discharge home, recommend the following:   A lot of help with walking and/or transfers;A lot of help with bathing/dressing/bathroom;Direct supervision/assist for medications management;Direct supervision/assist for financial  management;Assist for transportation;Help with stairs or ramp for entrance;Supervision due to cognitive status;Assistance with cooking/housework     Functional Status Assessment   Patient has had a recent decline in their functional status and demonstrates the ability to make significant improvements in function in a reasonable and predictable amount of time.     Equipment Recommendations   None recommended by OT     Recommendations for Other Services         Precautions/Restrictions   Precautions Precautions: Fall Precaution/Restrictions Comments: blind L eye Restrictions Weight Bearing Restrictions Per Provider Order: No     Mobility Bed Mobility Overal bed mobility: Needs Assistance Bed Mobility: Rolling, Sidelying to Sit, Sit to Sidelying Rolling: Min assist Sidelying to sit: Min assist     Sit to sidelying: Min assist      Transfers Overall transfer level: Needs assistance Equipment used: Rolling walker (2 wheels) Transfers: Sit to/from Stand, Bed to chair/wheelchair/BSC Sit to Stand: Min assist, From elevated surface, Mod assist     Step pivot transfers: Min assist, Mod assist     General transfer comment: posterior lean during standing, MOD A to correct posterior LOB due to pt flopping backwards in recliner without reaching, both feet sliding. with edu and cues pt, is able to improve to MIN A on return STS to bed      Balance Overall balance assessment: Needs assistance Sitting-balance support: Feet supported, No upper extremity supported Sitting balance-Leahy Scale: Good   Postural control: Posterior lean Standing balance support: Bilateral upper extremity supported, During functional activity, Reliant on assistive device for balance Standing balance-Leahy Scale: Poor Standing balance comment: limited by fatigue  ADL either performed or assessed with clinical judgement   ADL Overall ADL's : Needs  assistance/impaired Eating/Feeding: Minimal assistance;Sitting;Bed level Eating/Feeding Details (indicate cue type and reason): pt requires minA to open containers, locate items on tray due to L eye visual deficits                     Toilet Transfer: Moderate assistance;Minimal assistance;Cueing for sequencing;Cueing for safety;BSC/3in1;Rolling walker (2 wheels) Toilet Transfer Details (indicate cue type and reason): pt simulates toilet transfer to recliner, MIN A for powering up, posterior lean requiring cues to shift weight forward. pt takes small, shuffled sidesteps using RW, turns and does not control descent to recliner, MOD A to correct posterior LOB. with cues and education, pt is able to improve transfer with MIN A to rise from recliner and descends with MIN A to return to sitting EOB. Toileting- Clothing Manipulation and Hygiene: Maximal assistance;Sit to/from stand       Functional mobility during ADLs: Moderate assistance;Minimal assistance;Rolling walker (2 wheels);Cueing for sequencing;Cueing for safety General ADL Comments: simulated transfer to [facility] with RW, anticipate up to MAX A for LB ADLs and additional cuing for environment due to L eye vision loss (blind).     Vision Baseline Vision/History: 2 Legally blind Ability to See in Adequate Light: 2 Moderately impaired Patient Visual Report: No change from baseline (blind L eye)       Perception         Praxis         Pertinent Vitals/Pain Pain Assessment Pain Assessment: No/denies pain Pain Score: 0-No pain     Extremity/Trunk Assessment Upper Extremity Assessment Upper Extremity Assessment: Generalized weakness   Lower Extremity Assessment Lower Extremity Assessment: Generalized weakness       Communication Communication Communication: No apparent difficulties   Cognition Arousal: Alert Behavior During Therapy: WFL for tasks assessed/performed               OT - Cognition Comments:  self-limiting at times                 Following commands: Intact       Cueing  General Comments   Cueing Techniques: Verbal cues;Tactile cues;Visual cues  VSS on 2L O2. Pt self-selecting to remove O2 as OT was leaving room to eat, edu on placing [place] back on.   Exercises     Shoulder Instructions      Home Living Family/patient expects to be discharged to:: Assisted living                             Home Equipment: Agricultural consultant (2 wheels)   Additional Comments: Lives at [facility]F      Prior Functioning/Environment Prior Level of Function : Independent/Modified Independent;History of Falls (last six months)             Mobility Comments: Mod Ind amb facility distances with a RW, 2 falls in the last 6 months secondary to LOB ADLs Comments: Ind with ADLs, eats meals in the dining area at her facility    OT Problem List: Decreased strength;Decreased range of motion;Decreased activity tolerance;Impaired balance (sitting and/or standing);Impaired vision/perception;Decreased safety awareness;Decreased knowledge of use of DME or AE;Decreased knowledge of precautions;Cardiopulmonary status limiting activity   OT Treatment/Interventions: Self-care/ADL training;Therapeutic exercise;Neuromuscular education;Energy conservation;DME and/or AE instruction;Splinting;Therapeutic activities;Patient/family education;Balance training      OT Goals(Current goals can be found in the care plan section)  Acute Rehab OT Goals OT Goal Formulation: With patient Time For Goal Achievement: 02/16/24 Potential to Achieve Goals: Good   OT Frequency:  Min 2X/week       AM-PAC OT 6 Clicks Daily Activity     Outcome Measure Help from another person eating meals?: A Little Help from another person taking care of personal grooming?: A Little Help from another person toileting, which includes using toliet, bedpan, or urinal?: A Lot Help from another person  bathing (including washing, rinsing, drying)?: A Lot Help from another person to put on and taking off regular upper body clothing?: A Lot Help from another person to put on and taking off regular lower body clothing?: A Lot 6 Click Score: 14   End of Session Equipment Utilized During Treatment: Gait belt;Rolling walker (2 wheels);Oxygen Nurse Communication: Mobility status  Activity Tolerance: Patient tolerated treatment well Patient left: in bed;with bed alarm set;with call bell/phone within reach  OT Visit Diagnosis: Unsteadiness on feet (R26.81);Other abnormalities of gait and mobility (R26.89);Repeated falls (R29.6);Muscle weakness (generalized) (M62.81);History of falling (Z91.81)                Time: 8541-8483 OT Time Calculation (min): 18 min Charges:  OT General Charges $OT Visit: 1 Visit OT Evaluation $OT Eval Moderate Complexity: 1 Mod  Enya Bureau L. Kennedee Kitzmiller, OTR/L  02/02/24, 3:29 PM

## 2024-02-02 NOTE — Evaluation (Signed)
 Physical Therapy Evaluation Patient Details Name: Katie [name] MRN: [id number] DOB: 1955-11-22 Today's Date: 02/02/2024  History of Present Illness  Pt is a 68 y.o. female with medical history significant of COPD, HTN, ESRD on HD MWF sent to the ED from dialysis center due to decreased level of awareness during dialysis session. MD assessment includes: intraparenchymal hematoma of the brain, hyperkalemia, macrocytic anemia, and thrombocytopenia.   Clinical Impression  Pt was pleasant and motivated to participate during the session and put forth good effort throughout. Pt required physical assistance with all functional tasks this date per below and was only able to take several steps at the EOB and then from bed to chair before fatiguing and needing to return to sitting. Min A for stability required while taking steps.  Of note pt found on room air with [place] beside her in the bed with SpO2 95%.  Nursing in room and gave ok for trial session on room air.  Once pt performed bed mobility tasks and was sitting at the EOB SpO2 had dropped to 82-83%.  Pt returned to 2LO2/min with SpO2 remaining in the low to mid 90s for the remainder of the session including during ambulation. Pt presents with a significant decline in functional status compared to her baseline and is at an elevated risk for falls.  Pt will benefit from continued PT services upon discharge to safely address deficits listed in patient problem list for decreased caregiver assistance and eventual return to PLOF.          If plan is discharge home, recommend the following: A lot of help with walking and/or transfers;A little help with bathing/dressing/bathroom;Assistance with cooking/housework;Direct supervision/assist for medications management;Assist for transportation   Can travel by private vehicle   No    Equipment Recommendations None recommended by PT  Recommendations for Other Services       Functional Status Assessment Patient  has had a recent decline in their functional status and demonstrates the ability to make significant improvements in function in a reasonable and predictable amount of time.     Precautions / Restrictions Precautions Precautions: Fall Restrictions Weight Bearing Restrictions Per Provider Order: No      Mobility  Bed Mobility Overal bed mobility: Needs Assistance Bed Mobility: Rolling, Sidelying to Sit Rolling: Min assist Sidelying to sit: Mod assist       General bed mobility comments: Min to mod A for BLE and trunk control with cues for log roll sequencing for decreased caregiver assistance    Transfers Overall transfer level: Needs assistance Equipment used: Rolling walker (2 wheels) Transfers: Sit to/from Stand Sit to Stand: Min assist, From elevated surface           General transfer comment: Mod verbal cues for hand placement and min A to come to standing    Ambulation/Gait Ambulation/Gait assistance: Min assist Gait Distance (Feet): 5 Feet Assistive device: Rolling walker (2 wheels) Gait Pattern/deviations: Step-through pattern, Decreased step length - right, Decreased step length - left, Trunk flexed Gait velocity: decreased     General Gait Details: Pt able to take several small steps near the EOB and from bed to chair with min A for stability  Stairs            Wheelchair Mobility     Tilt Bed    Modified Rankin (Stroke Patients Only)       Balance Overall balance assessment: Needs assistance   Sitting balance-Leahy Scale: Good     Standing balance support:  Bilateral upper extremity supported, During functional activity, Reliant on assistive device for balance Standing balance-Leahy Scale: Poor                               Pertinent Vitals/Pain Pain Assessment Pain Assessment: 0-10 Pain Score: 5  Pain Location: R knee and elbow Pain Descriptors / Indicators: Sore Pain Intervention(s): Repositioned, Monitored during  session, Premedicated before session    Home Living Family/patient expects to be discharged to:: Assisted living                 Home Equipment: Agricultural consultant (2 wheels) Additional Comments: Lives at [facility]    Prior Function Prior Level of Function : Independent/Modified Independent;History of Falls (last six months)             Mobility Comments: Mod Ind amb facility distances with a RW, 2 falls in the last 6 months secondary to LOB ADLs Comments: Ind with ADLs, eats meals in the dining area at her facility     Extremity/Trunk Assessment   Upper Extremity Assessment Upper Extremity Assessment: Generalized weakness    Lower Extremity Assessment Lower Extremity Assessment: Generalized weakness       Communication   Communication Communication: No apparent difficulties    Cognition Arousal: Alert Behavior During Therapy: WFL for tasks assessed/performed   PT - Cognitive impairments: No family/caregiver present to determine baseline                       PT - Cognition Comments: Pt required extra time and occasionally questions to be repeated to process but was able to answer all questions appropriately Following commands: Intact       Cueing Cueing Techniques: Verbal cues, Tactile cues, Visual cues     General Comments      Exercises Total Joint Exercises Long Arc Quad: AROM, Strengthening, Both, 10 reps Knee Flexion: AROM, Strengthening, Both, 10 reps Marching in Standing: AROM, Strengthening, Both, 5 reps, Standing Other Exercises Other Exercises: Log roll training   Assessment/Plan    PT Assessment Patient needs continued PT services  PT Problem List Decreased strength;Decreased activity tolerance;Decreased balance;Decreased mobility;Decreased knowledge of use of DME;Pain       PT Treatment Interventions DME instruction;Gait training;Functional mobility training;Therapeutic exercise;Therapeutic activities;Balance  training;Patient/family education    PT Goals (Current goals can be found in the Care Plan section)  Acute Rehab PT Goals Patient Stated Goal: To get stronger and walk better PT Goal Formulation: With patient Time For Goal Achievement: 02/15/24 Potential to Achieve Goals: Good    Frequency Min 2X/week     Co-evaluation               AM-PAC PT 6 Clicks Mobility  Outcome Measure Help needed turning from your back to your side while in a flat bed without using bedrails?: A Little Help needed moving from lying on your back to sitting on the side of a flat bed without using bedrails?: A Lot Help needed moving to and from a bed to a chair (including a wheelchair)?: A Lot Help needed standing up from a chair using your arms (e.g., wheelchair or bedside chair)?: A Little Help needed to walk in hospital room?: A Lot Help needed climbing 3-5 steps with a railing? : Total 6 Click Score: 13    End of Session Equipment Utilized During Treatment: Gait belt Activity Tolerance: Patient tolerated treatment well Patient  left: in chair;with call bell/phone within reach;with chair alarm set Nurse Communication: Mobility status PT Visit Diagnosis: Unsteadiness on feet (R26.81);History of falling (Z91.81);Difficulty in walking, not elsewhere classified (R26.2);Muscle weakness (generalized) (M62.81);Pain Pain - Right/Left: Right Pain - part of body: Arm;Knee    Time: 9050-8986 PT Time Calculation (min) (ACUTE ONLY): 24 min   Charges:   PT Evaluation $PT Eval Moderate Complexity: 1 Mod   PT General Charges $$ ACUTE PT VISIT: 1 Visit       D. Scott Ines Warf PT, DPT 02/02/24, 11:46 AM

## 2024-02-02 NOTE — Discharge Instructions (Signed)
 Nutrition for Dialysis  You can enjoy many foods when you have chronic kidney disease. Limiting a few others can also help you be healthy. Follow these tips and those given by  your registered dietitian nutritionist (RDN).  Protein People on dialysis need a lot of protein. Choose 2 to 3 palm-sized portions  of protein foods each day. These include fresh lean beef, lamb, veal, wild  game, and "natural" chicken, fish, pork, seafood, and [place], or 2 to 3 eggs  or egg whites. Beans, edamame, lentils, nut butters, and tofu may be good  options in meatless meals. Limit salty processed meats such as bacon,  brats, deli meats, ham, hot dogs, and sausage.  Dairy Products Limit milk, ice cream, pudding, and yogurt to 4 to 8 oz ( to 1 cup) per  day or 1 slice of natural cheese (such as cheddar, mozzarella, or Swiss).  Or, instead of cow's milk, use unfortified rice, almond, or soy milk. Limit  processed cheeses, such as American cheese, Cheez Whiz, [place],  boxed macaroni and cheese, and other cheese spreads or sauces with  "phos" ingredients.  Breads, Grains, and Starches Choose whole grain breads, grains, and lower salt snacks. Limit processed  foods such as boxed mixes, biscuits, muffins, pancakes, waffles, instant  hot cereals, and ready-to-eat baked goods with "phos" ingredients.  Fluids If you make little urine, you may need to limit how much you drink. Stay  within the amount recommended by your dialysis team. This includes all  beverages and ice, as well as the fluid in foods such as gelatin, ice cream,  popsicles, and soup. Look for beverages without "phos" ingredients.  Fruits Enjoy 2 to 3 servings per day (a serving is  cup or 1 small fruit):  Lower Potassium Apples Applesauce Berries Canned fruit or fruit cups Clementine Grapes Kumquat Lemon and lime Mandarin orange Pear Pineapple Plum Tangerine Watermelon  Higher Potassium Avocado Banana Dried  fruit Guava Jackfruit Kiwi Mango Melons: cantaloupe or honeydew Nectarine Orange Papaya Peach Persimmon Plantain Pomegranate Pomelo  Vegetables Enjoy 2 to 3 servings per day (a serving is 1 cup leafy greens or  cup fresh, cooked, or canned):  Lower Potassium Asparagus Bitter melon Broccoli Cabbage Carrots Cauliflower Celery Corn Cucumber Eggplant Green beans Greens: collard, mustard, or turnip Kale  Lettuce Okra Onion Peppers Radish Spaghetti squash Sweet or snap peas Turnip  Higher Potassium Artichoke Brussels sprouts Cassava root Celery root (celeriac) Cooked chard Kohlrabi Parsnips Potato Pumpkin Rutabaga Squash: acorn, butternut, hubbard, most winter varieties Sweet potatoes or yams (batata) Taro root Tomatoes or tomato sauce Zucchini  Salt and Sodium Choose foods with less than 200 mg sodium per serving. Packaged meals  should have less than 600 mg per serving. Do not add salt to foods.  Instead, use herbs and spices such as garlic, onion or garlic powder, basil,  oregano, paprika, pepper, or thyme. You can also add flavor with lemon,  lime, vinegars, or a salt-free seasoning blend like Mrs. Dash.  Added Phosphorus Limit foods and drinks with added phosphorus (any ingredients with  "phos," such as calcium phosphate or phosphoric acid). Fast food,  convenience or gas station foods, restaurant meals, and other processed,  boxed, or prepared foods are often high in added phosphorus.   If You Have Diabetes Follow your diabetes meal and snack plan with the above diet changes. Do not use orange  juice to treat low blood sugars. It is high in potassium. Instead, choose:  glucose tabs or gel  cup regular cranberry, grape, or apple juice    cup 7-Up or Sprite

## 2024-02-02 NOTE — Plan of Care (Signed)

## 2024-02-02 NOTE — Progress Notes (Signed)
 OT Cancellation Note  Patient Details Name: Katie [name] MRN: [id number] DOB: [birth year]   Cancelled Treatment:    Reason Eval/Treat Not Completed: Patient declined, no reason specified. Pt received in bed with NT present, pt adamantly refusing participation in OT eval as she just got back into bed. Pt agreeable to OT returning this afternoon, OT will check back as able.  Keagon Glascoe L. Duran Ohern, OTR/L  02/02/24, 11:01 AM

## 2024-02-02 NOTE — Progress Notes (Addendum)
 [facility]  ROUNDING NOTE   Subjective:  Patient seen and evaluated at bedside.  Patient endorses feeling better today, had recent bowel movement.  Patient eating breakfast in high spirits, no acute complaints.  No complaints with dialysis yesterday. Patient acknowledges having dialysis tomorrow as scheduled if she still here.   Objective:  Vital signs in last 24 hours:  Temp:  [98.1 F (36.7 C)-98.7 F (37.1 C)] 98.1 F (36.7 C) (09/21 0744) Pulse Rate:  [60-85] 61 (09/21 0744) Resp:  [10-22] 18 (09/21 0744) BP: (115-207)/(51-85) 138/54 (09/21 0744) SpO2:  [89 %-100 %] 100 % (09/21 0744) Weight:  [57.2 kg] 57.2 kg (09/20 1028)  Weight change: 2.315 kg Filed Weights   01/31/24 1201 02/01/24 0551 02/01/24 1028  Weight: 54.9 kg 57.5 kg 57.2 kg    Intake/Output: I/O last 3 completed shifts: In: 960 [P.O.:960] Out: 300 [Urine:300]   Intake/Output this shift:  No intake/output data recorded.  Physical Exam: General: NAD,   Head: Normocephalic  Eyes: Anicteric  Neck: Supple  Lungs:  Diminished, 2L [place] patient seen and evaluated at bedside.  Heart: Regular   Abdomen:  Soft  Extremities:  No peripheral edema.  Neurologic: Alert and oriented.  Skin: Scattered rashes  Access: Left AVF    Basic Metabolic Panel: Recent Labs  Lab 01/31/24 1206 02/01/24 0535 02/02/24 0316  NA 139 138 136  K 5.0 5.3* 4.2  CL 100 100 99  CO2 25 21* 28  GLUCOSE 119* 70 92  BUN 93* 87* 59*  CREATININE 5.47* 5.95* 4.62*  CALCIUM  8.6* 8.6* 8.4*  PHOS  --  6.2*  --     Liver Function Tests: Recent Labs  Lab 02/02/24 0316  AST 91*  ALT 131*  ALKPHOS 88  BILITOT 0.7  PROT 5.5*  ALBUMIN  2.6*   No results for input(s): LIPASE, AMYLASE in the last 168 hours. No results for input(s): AMMONIA in the last 168 hours.  CBC: Recent Labs  Lab 01/31/24 1206 02/01/24 0535 02/02/24 0316  WBC 7.2 5.1 6.5  NEUTROABS 5.3  --   --   HGB 10.3* 9.5* 8.9*  HCT 32.8*  30.9* 28.0*  MCV 102.8* 103.3* 102.2*  PLT 110* 86* 79*    Cardiac Enzymes: No results for input(s): CKTOTAL, CKMB, CKMBINDEX, TROPONINI in the last 168 hours.  BNP: Invalid input(s): POCBNP  CBG: Recent Labs  Lab 02/01/24 2201  GLUCAP 101*    Microbiology: Results for orders placed or performed during the hospital encounter of 01/31/24  Urine Culture     Status: Abnormal (Preliminary result)   Collection Time: 01/31/24 12:06 PM   Specimen: Urine, Random  Result Value Ref Range Status   Specimen Description   Final    URINE, RANDOM Performed at [facility], [address] [place], [place] [address]    Special Requests   Final    NONE Reflexed from [id number] Performed at [facility], [address] [place], [place] [address]    Culture (A)  Final    50,000 COLONIES/mL STREPTOCOCCUS MITIS/ORALIS SUSCEPTIBILITIES TO FOLLOW Performed at [facility], [address] [place], [place] [address]    Report Status PENDING  Incomplete    Coagulation Studies: No results for input(s): LABPROT, INR in the last 72 hours.  Urinalysis: Recent Labs    01/31/24 1206  COLORURINE YELLOW*  LABSPEC 1.014  PHURINE 6.0  GLUCOSEU NEGATIVE  HGBUR NEGATIVE  BILIRUBINUR NEGATIVE  KETONESUR NEGATIVE  PROTEINUR 100*  NITRITE  NEGATIVE  LEUKOCYTESUR SMALL*      Imaging: MR BRAIN WO CONTRAST Result Date: 02/01/2024 EXAM: MRI BRAIN WITHOUT CONTRAST 02/01/2024 07:39:00 PM TECHNIQUE: Multiplanar multisequence MRI of the head/brain was performed without the administration of intravenous contrast. COMPARISON: Head CT 01/31/2024. CLINICAL HISTORY: Subdural hematoma; also intraparenchymal hematoma. Intracranial hematoma following injury, without loss of consciousness, initial encounter. Intraparenchymal hematoma of brain. 2 falls approximately 2 weeks ago which no intraparenchymal hematoma was noted. No acute surgery needs at this time as per neuro surg.  MRI brain ordered. Neuro surg following and recs apprec. FINDINGS: BRAIN AND VENTRICLES: Intraparenchymal hematoma in the inferior right temporal lobe, unchanged in appearance compared to the prior CT. Small left frontal subdural hematoma, also unchanged. Small amount of subdural blood along the falx cerebri, stable. Trace subdural blood at the posterior right convexity, not visible by CT. No acute infarct. No midline shift. No hydrocephalus. ORBITS: Chronic blood products in the posterior left globe. SINUSES AND MASTOIDS: No acute abnormality. BONES AND SOFT TISSUES: Normal marrow signal. No acute soft tissue abnormality. IMPRESSION: 1. Unchanged intraparenchymal hematoma in the inferior right temporal lobe. 2. Unchanged small left frontal subdural hematoma. 3. Trace subdural blood at the posterior right convexity, not visible by CT. Electronically signed by: Franky Stanford MD 02/01/2024 08:13 PM EDT RP Workstation: [id number]   DG Abd 1 View Result Date: 02/01/2024 CLINICAL DATA:  [id number] Encounter for imaging to screen for metal prior to magnetic resonance imaging (MRI) 352040. EXAM: ABDOMEN - 1 VIEW COMPARISON:  10/29/2021. FINDINGS: The bowel gas pattern is non-obstructive. No evidence of pneumoperitoneum. There is subacute slightly displaced fracture of the lateral aspect of right sixth rib. No other acute osseous abnormalities. The soft tissues are within normal limits. Surgical changes, devices, tubes and lines: None. No metallic foreign body. IMPRESSION: 1. No metallic foreign body. 2. Subacute slightly displaced fracture of the lateral aspect of right sixth rib. Electronically Signed   By: Ree Molt M.D.   On: 02/01/2024 17:59   DG Chest Port 1 View Result Date: 02/01/2024 CLINICAL DATA:  [id number] Encounter for imaging to screen for metal prior to magnetic resonance imaging (MRI) 352040. EXAM: PORTABLE CHEST 1 VIEW COMPARISON:  08/15/2023. FINDINGS: Stable mild prominence of interstitial markings. No  frank pulmonary edema. Bilateral lung fields are otherwise clear. There is subtle blunting bilateral lateral costophrenic angles, which may represent trace pleural effusions. No pneumothorax on either side. Stable cardio-mediastinal silhouette. No acute osseous abnormalities. The soft tissues are within normal limits. No metallic foreign body. IMPRESSION: No metallic foreign body. Electronically Signed   By: Ree Molt M.D.   On: 02/01/2024 17:56   CT HEAD WO CONTRAST ( ) Result Date: 01/31/2024 EXAM: CT HEAD WITHOUT CONTRAST 01/31/2024 08:25:41 PM TECHNIQUE: CT of the head was performed without the administration of intravenous contrast. Automated exposure control, iterative reconstruction, and/or weight based adjustment of the mA/kV was utilized to reduce the radiation dose to as low as reasonably achievable. COMPARISON: 01/31/2024 01:40:00 PM CLINICAL HISTORY: Follow up ICH. FINDINGS: BRAIN AND VENTRICLES: Unchanged appearance of intraparenchymal hematoma in the right posterior temporal lobe. There is a small subdural hematoma along the left anterior convexity that was not visible on the prior scan. This measures 4 mm in thickness. No mass effect or midline shift. Mild volume loss. ORBITS: No acute abnormality. SINUSES: No acute abnormality. SOFT TISSUES AND SKULL: No acute soft tissue abnormality. No skull fracture. VASCULATURE: ICA atherosclerosis. IMPRESSION: 1. Unchanged intraparenchymal hematoma in the right posterior temporal lobe.  2. Small left anterior convexity subdural hematoma measuring 4 mm, not visible on prior, but the relatively low density suggests that it may be subacute. Short interval follow-up recommended. Electronically signed by: Franky Stanford MD 01/31/2024 08:31 PM EDT RP Workstation: [id number]   CT Angio Head W or Wo Contrast Result Date: 01/31/2024 EXAM: CTA Head without and with Intravenous Contrast CLINICAL HISTORY: Cerebral aneurysm screening, high-risk. TECHNIQUE: Axial  CTA images of the head without and with intravenous contrast. MIP reconstructed images were created and reviewed. Dose reduction technique was used including one or more of the following: automated exposure control, adjustment of mA and kV according to patient size, and/or iterative reconstruction. CONTRAST: 75mL (iohexol  (OMNIPAQUE ) 350 MG/ML injection 75 mL IOHEXOL  350 MG/ML SOLN) COMPARISON: MRA Head 06/25/2020 FINDINGS: INTERNAL CAROTID ARTERIES: The internal carotid arteries are patent from the included distal cervical segments through the termini with atherosclerotic calcifications resulting in mild cavernous and proximal supraclinoid stenoses bilaterally. ANTERIOR CEREBRAL ARTERIES: ACAs are patent without evidence of a proximal branch occlusion or significant proximal stenosis. MIDDLE CEREBRAL ARTERIES: MCAs are patent without evidence of a proximal branch occlusion or significant proximal stenosis. POSTERIOR CEREBRAL ARTERIES: Both PCAs are patent without evidence of any significant proximal stenosis. There are robust posterior communicating arteries bilaterally with hypoplasia of the right greater than left P1 segments. BASILAR ARTERY: The basilar artery is widely patent. VERTEBRAL ARTERIES: The included distal vertebral arteries are patent to the basilar with the right being strongly dominant. Atherosclerotic calcification results in moderate to severe stenoses of the distal left V3 and V4 segments. The majority of the left V4 segment is now diffusely small in caliber which reflects a change from the prior MRA. No aneurysm or vascular malformation is identified. SOFT TISSUES: No acute finding. BONES: No acute osseous abnormality. VENOUS SINUSES: The major dural venous sinuses appear patent. IMPRESSION: 1. No large vessel occlusion or aneurysm. 2. 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 Electronically signed by: Dasie Hamburg MD 01/31/2024 05:23 PM EDT RP Workstation: [id number]   CT Head Wo Contrast Result Date: 01/31/2024 CLINICAL DATA:  Head trauma with mental status change. EXAM: CT HEAD WITHOUT CONTRAST TECHNIQUE: Contiguous axial images were obtained from the base of the skull through the vertex without intravenous contrast. RADIATION DOSE REDUCTION: This exam was performed according to the departmental dose-optimization program which includes automated exposure control, adjustment of the mA and/or kV according to patient size and/or use of iterative reconstruction technique. COMPARISON:  01/14/2024 FINDINGS: Traumatic Brain Injury Risk Stratification Skull Fracture: No - Low/mBIG 1 Subdural Hematoma (SDH): Subtle amount of acute subdural hemorrhage along the posterior dependent superior and right-sided tentorium. Subarachnoid Hemorrhage [facility]): No Epidural Hematoma (EDH): No - Low/mBIG 1 Cerebral contusion, intra-axial, intraparenchymal Hemorrhage (IPH): Acute intraparenchymal hemorrhage over the posterior right temporal lobe measuring approximately 1.8 x 1.8 x 0.9 cm in AP, transverse and craniocaudal dimension. BIG 3. Intraventricular Hemorrhage (IVH): No - Low/mBIG 1 Midline Shift > 1mm or Edema/effacement of sulci/vents: No - Low/mBIG 1 ---------------------------------------------------- Brain: Ventricles and cisterns are normal. Acute intraparenchymal hemorrhage as described over the posterior right temporal lobe with mild associated adjacent edema/local mass effect. No midline shift. Subtle acute hemorrhage along the posterior interhemispheric fissure and along the right tentorium. Vascular: No hyperdense vessel or unexpected calcification. Skull: No acute fracture. Sinuses/Orbits: No acute finding. Other: None. IMPRESSION: 1. Acute intraparenchymal hemorrhage over the posterior right temporal lobe measuring 1.8 x 1.8 x 0.9  cm with mild associated adjacent edema/local mass effect. No midline shift. Compatible with  BIG-3 classification of mTBI. 2. Subtle acute hemorrhage along the posterior interhemispheric fissure and along the right tentorium. Critical Value/emergent results were called by telephone at the time of interpretation on 01/31/2024 at 2:05 pm to provider PHILLIP STAFFORD , who verbally acknowledged these results. Electronically Signed   By: Toribio Agreste M.D.   On: 01/31/2024 14:08     Medications:    cefTRIAXone  (ROCEPHIN )  IV      bisacodyl   10 mg Oral QHS   calcitRIOL   0.25 mcg Oral Daily   DULoxetine   30 mg Oral Daily   [START ON 02/03/2024] epoetin  alfa-epbx (RETACRIT ) injection  4,000 Units Intravenous Q M,W,F-1800   famotidine   10 mg Oral QHS   fluticasone  furoate-vilanterol  1 puff Inhalation Daily   melatonin  5 mg Oral QHS   metoprolol  tartrate  12.5 mg Oral BID   pantoprazole   40 mg Oral Daily   polyethylene glycol  17 g Oral BID   traZODone   75 mg Oral QHS   acetaminophen  **OR** acetaminophen , albuterol , guaiFENesin -dextromethorphan , HYDROmorphone  (DILAUDID ) injection, labetalol , ondansetron  **OR** ondansetron  (ZOFRAN ) IV, oxyCODONE , topiramate   Assessment/ Plan:  [name] is a 68 y.o.  female with past medical history of ESRD with hemodialysis, diabetes mellitus, COPD, and hypertension. Patient presents to ED from outpatient dialysis for decreased level of conscience.  [facility]/MWF/Lt AVF   End stage renal disease on hemodialysis. Patient received dialysis for yesterday as her previous treatment was only . Tolerated well. Plan for next treatment as scheduled for tomorrow. Patient receives midodrine  prior to dialysis in outpatient.   2.  Decreased level of consciousness, intraparenchymal hematoma of the brain             She was found on dialysis with depressed respirations and decreased level of consciousness. In the ED CT imaging demonstrated a intraparenchymal hematoma.  Neurology and neurosurgery has been consulted. No surgical interventions  determined at this time     3. Anemia of chronic kidney disease Recent Labs           Lab Results  Component Value Date    HGB 8.9 (L) 02/02/2024      Continue low dose Retacrit  4000 units with dialysis.    4. Secondary Hyperparathyroidism: with outpatient labs: PTH 507, phosphorus 3.9, calcium  8.5 on 12/23/23.    Recent Labs           Lab Results  Component Value Date    PTH 70 (H) 05/12/2020    CALCIUM  8.4 02/02/2024    CAION 1.13 (L) 08/15/2023    PHOS 6.2 02/01/2024      Will start sevelamere. 1tab PO TID   5. Hypoalbuminemia  Patient last albumin  3.0. Patient is on IDPN outpatient. Diet consult pending. Will give albumin  with dialysis.     LOS: 0 Jai Steil P Levorn 9/21/20259:20 AM

## 2024-02-03 ENCOUNTER — Other Ambulatory Visit: Payer: Self-pay | Admitting: Family Medicine

## 2024-02-03 ENCOUNTER — Telehealth: Payer: Self-pay | Admitting: Neurosurgery

## 2024-02-03 DIAGNOSIS — D696 Thrombocytopenia, unspecified: Secondary | ICD-10-CM | POA: Diagnosis present

## 2024-02-03 DIAGNOSIS — D631 Anemia in chronic kidney disease: Secondary | ICD-10-CM | POA: Diagnosis present

## 2024-02-03 DIAGNOSIS — I6200 Nontraumatic subdural hemorrhage, unspecified: Secondary | ICD-10-CM | POA: Diagnosis present

## 2024-02-03 DIAGNOSIS — Z7982 Long term (current) use of aspirin: Secondary | ICD-10-CM | POA: Diagnosis not present

## 2024-02-03 DIAGNOSIS — I350 Nonrheumatic aortic (valve) stenosis: Secondary | ICD-10-CM | POA: Diagnosis present

## 2024-02-03 DIAGNOSIS — I5032 Chronic diastolic (congestive) heart failure: Secondary | ICD-10-CM | POA: Diagnosis present

## 2024-02-03 DIAGNOSIS — I132 Hypertensive heart and chronic kidney disease with heart failure and with stage 5 chronic kidney disease, or end stage renal disease: Secondary | ICD-10-CM | POA: Diagnosis present

## 2024-02-03 DIAGNOSIS — N186 End stage renal disease: Secondary | ICD-10-CM | POA: Diagnosis present

## 2024-02-03 DIAGNOSIS — Z8249 Family history of ischemic heart disease and other diseases of the circulatory system: Secondary | ICD-10-CM | POA: Diagnosis not present

## 2024-02-03 DIAGNOSIS — Z825 Family history of asthma and other chronic lower respiratory diseases: Secondary | ICD-10-CM | POA: Diagnosis not present

## 2024-02-03 DIAGNOSIS — Z87820 Personal history of traumatic brain injury: Secondary | ICD-10-CM | POA: Diagnosis not present

## 2024-02-03 DIAGNOSIS — R4182 Altered mental status, unspecified: Secondary | ICD-10-CM | POA: Diagnosis present

## 2024-02-03 DIAGNOSIS — S062X0A Diffuse traumatic brain injury without loss of consciousness, initial encounter: Secondary | ICD-10-CM | POA: Diagnosis not present

## 2024-02-03 DIAGNOSIS — E875 Hyperkalemia: Secondary | ICD-10-CM | POA: Diagnosis not present

## 2024-02-03 DIAGNOSIS — Z801 Family history of malignant neoplasm of trachea, bronchus and lung: Secondary | ICD-10-CM | POA: Diagnosis not present

## 2024-02-03 DIAGNOSIS — E1122 Type 2 diabetes mellitus with diabetic chronic kidney disease: Secondary | ICD-10-CM | POA: Diagnosis present

## 2024-02-03 DIAGNOSIS — Z992 Dependence on renal dialysis: Secondary | ICD-10-CM | POA: Diagnosis not present

## 2024-02-03 DIAGNOSIS — Z8049 Family history of malignant neoplasm of other genital organs: Secondary | ICD-10-CM | POA: Diagnosis not present

## 2024-02-03 DIAGNOSIS — Z8042 Family history of malignant neoplasm of prostate: Secondary | ICD-10-CM | POA: Diagnosis not present

## 2024-02-03 DIAGNOSIS — I615 Nontraumatic intracerebral hemorrhage, intraventricular: Secondary | ICD-10-CM | POA: Diagnosis present

## 2024-02-03 DIAGNOSIS — K219 Gastro-esophageal reflux disease without esophagitis: Secondary | ICD-10-CM | POA: Diagnosis present

## 2024-02-03 DIAGNOSIS — Z66 Do not resuscitate: Secondary | ICD-10-CM | POA: Diagnosis present

## 2024-02-03 DIAGNOSIS — N2581 Secondary hyperparathyroidism of renal origin: Secondary | ICD-10-CM | POA: Diagnosis present

## 2024-02-03 DIAGNOSIS — Z87891 Personal history of nicotine dependence: Secondary | ICD-10-CM | POA: Diagnosis not present

## 2024-02-03 DIAGNOSIS — J449 Chronic obstructive pulmonary disease, unspecified: Secondary | ICD-10-CM | POA: Diagnosis present

## 2024-02-03 DIAGNOSIS — E8809 Other disorders of plasma-protein metabolism, not elsewhere classified: Secondary | ICD-10-CM | POA: Diagnosis present

## 2024-02-03 DIAGNOSIS — S06330S Contusion and laceration of cerebrum, unspecified, without loss of consciousness, sequela: Secondary | ICD-10-CM

## 2024-02-03 LAB — COMPREHENSIVE METABOLIC PANEL WITH GFR
ALT: 116 U/L — ABNORMAL HIGH (ref 0–44)
AST: 68 U/L — ABNORMAL HIGH (ref 15–41)
Albumin: 2.6 g/dL — ABNORMAL LOW (ref 3.5–5.0)
Alkaline Phosphatase: 92 U/L (ref 38–126)
Anion gap: 10 (ref 5–15)
BUN: 65 mg/dL — ABNORMAL HIGH (ref 8–23)
CO2: 25 mmol/L (ref 22–32)
Calcium: 8.3 mg/dL — ABNORMAL LOW (ref 8.9–10.3)
Chloride: 97 mmol/L — ABNORMAL LOW (ref 98–111)
Creatinine, Ser: 5.42 mg/dL — ABNORMAL HIGH (ref 0.44–1.00)
GFR, Estimated: 8 mL/min — ABNORMAL LOW (ref >60)
Glucose, Bld: 86 mg/dL (ref 70–99)
Potassium: 4.8 mmol/L (ref 3.5–5.1)
Sodium: 132 mmol/L — ABNORMAL LOW (ref 135–145)
Total Bilirubin: 0.6 mg/dL (ref 0.0–1.2)
Total Protein: 5.6 g/dL — ABNORMAL LOW (ref 6.5–8.1)

## 2024-02-03 LAB — CBC
HCT: 30.3 % — ABNORMAL LOW (ref 36.0–46.0)
Hemoglobin: 9.7 g/dL — ABNORMAL LOW (ref 12.0–15.0)
MCH: 32.6 pg (ref 26.0–34.0)
MCHC: 32 g/dL (ref 30.0–36.0)
MCV: 101.7 fL — ABNORMAL HIGH (ref 80.0–100.0)
Platelets: 82 K/uL — ABNORMAL LOW (ref 150–400)
RBC: 2.98 MIL/uL — ABNORMAL LOW (ref 3.87–5.11)
RDW: 13.4 % (ref 11.5–15.5)
WBC: 6 K/uL (ref 4.0–10.5)
nRBC: 0 % (ref 0.0–0.2)

## 2024-02-03 LAB — PHOSPHORUS: Phosphorus: 5.2 mg/dL — ABNORMAL HIGH (ref 2.5–4.6)

## 2024-02-03 LAB — URINE CULTURE: Culture: 50000 — AB

## 2024-02-03 MED ORDER — EPOETIN ALFA-EPBX 4000 UNIT/ML IJ SOLN
INTRAMUSCULAR | Status: AC
  Filled 2024-02-03: qty 1

## 2024-02-03 MED ORDER — ALBUMIN HUMAN 25 % IV SOLN
INTRAVENOUS | Status: AC
  Filled 2024-02-03: qty 50

## 2024-02-03 MED ORDER — SODIUM CHLORIDE 0.9 % IV SOLN
12.5000 mg | Freq: Four times a day (QID) | INTRAVENOUS | Status: DC | PRN
  Administered 2024-02-03: 12.5 mg via INTRAVENOUS
  Filled 2024-02-03: qty 12.5

## 2024-02-03 MED ORDER — SCOPOLAMINE 1 MG/3DAYS TD PT72
1.0000 | MEDICATED_PATCH | TRANSDERMAL | Status: DC
  Administered 2024-02-03: 1 mg via TRANSDERMAL
  Filled 2024-02-03: qty 1

## 2024-02-03 MED ORDER — ALBUMIN HUMAN 25 % IV SOLN
12.5000 g | Freq: Once | INTRAVENOUS | Status: AC
  Administered 2024-02-03: 12.5 g via INTRAVENOUS

## 2024-02-03 MED ORDER — ONDANSETRON HCL 4 MG/2ML IJ SOLN
INTRAMUSCULAR | Status: AC
  Filled 2024-02-03: qty 2

## 2024-02-03 NOTE — Progress Notes (Signed)
 PROGRESS NOTE    Katie [name]  FMW:[id number] DOB: 27-Jan-1956 DOA: 01/31/2024 PCP: Cordella Corning, FNP    Assessment & Plan:   Principal Problem:   Intracranial hematoma following injury, without loss of consciousness, initial encounter [facility]) Active Problems:   Intraparenchymal hematoma of brain (HCC)  Assessment and Plan: Intraparenchymal hematoma of the brain: recent fall within last week as per pt today. No acute surgery needs at this time as per neuro surg. MRI brain shows stable intraparenchymal hematoma, unchanged small left frontal subdural hematoma & trace subdural blood at the posterior right convexity, not visible by CT. Will need to f/u outpatient w/ neuro surg in 2 weeks for repeat CT. Neuro recs apprec   Generalized weakness: PT/OT recs SNF   ESRD: on HD MWF. Nephro following and recs apprec   Hyperkalemia: WNL today    HTN: continue on metoprolol     COPD: w/o exacerbation. Continue on bronchodilators    GERD: continue on PPI, pepcid    Thrombocytopenia: etiology unclear, labile. Will continue to monitor   Macrocytic anemia: B12 427 on 01/14/24. No need for a transfusion currently      DVT prophylaxis: SCDs Code Status: DNR Family Communication:  Disposition Plan: likely d/c to SNF  Level of care: Telemetry Medical  Status is: Inpatient Remains inpatient appropriate because: severity of illness     Consultants:  Nephro Neuro surg   Procedures:   Antimicrobials:    Subjective: Pt c/o nausea  Objective: Vitals:   02/03/24 0800 02/03/24 0830 02/03/24 0900 02/03/24 0930  BP: (!) 117/52 (!) 137/58 (!) 137/56 (!) 154/64  Pulse: 65 68 70 69  Resp: 18 15 13 14   Temp:      TempSrc:      SpO2: 93% 93% 93% 95%  Weight:      Height:        Intake/Output Summary (Last 24 hours) at 02/03/2024 0941 Last data filed at 02/02/2024 1900 Gross per 24 hour  Intake 720 ml  Output --  Net 720 ml   Filed Weights   02/01/24 0551 02/01/24 1028 02/03/24  0748  Weight: 57.5 kg 57.2 kg 55.7 kg    Examination:  General exam: appears uncomfortable Respiratory system: diminished breath sounds b/l   Cardiovascular system: S1 & S2+. Systolic murmur 3/6 Gastrointestinal system: abd is soft, NT, ND & hypoactive bowel sounds Central nervous system:  alert & awake. Moves all extremities  Psychiatry: judgement and insight appears at baseline. Flat mood and affect    Data Reviewed: I have personally reviewed following labs and imaging studies  CBC: Recent Labs  Lab 01/31/24 1206 02/01/24 0535 02/02/24 0316 02/03/24 0329  WBC 7.2 5.1 6.5 6.0  NEUTROABS 5.3  --   --   --   HGB 10.3* 9.5* 8.9* 9.7*  HCT 32.8* 30.9* 28.0* 30.3*  MCV 102.8* 103.3* 102.2* 101.7*  PLT 110* 86* 79* 82*   Basic Metabolic Panel: Recent Labs  Lab 01/31/24 1206 02/01/24 0535 02/02/24 0316 02/03/24 0329  NA 139 138 136 132*  K 5.0 5.3* 4.2 4.8  CL 100 100 99 97*  CO2 25 21* 28 25  GLUCOSE 119* 70 92 86  BUN 93* 87* 59* 65*  CREATININE 5.47* 5.95* 4.62* 5.42*  CALCIUM  8.6* 8.6* 8.4* 8.3*  PHOS  --  6.2*  --   --    GFR: Estimated Creatinine Clearance: 9.1 mL/min (A) (by C-G formula based on SCr of 5.42 mg/dL (H)). Liver Function Tests: Recent Labs  Lab 02/02/24  9683 02/03/24 0329  AST 91* 68*  ALT 131* 116*  ALKPHOS 88 92  BILITOT 0.7 0.6  PROT 5.5* 5.6*  ALBUMIN  2.6* 2.6*   No results for input(s): LIPASE, AMYLASE in the last 168 hours. No results for input(s): AMMONIA in the last 168 hours. Coagulation Profile: No results for input(s): INR, PROTIME in the last 168 hours. Cardiac Enzymes: No results for input(s): CKTOTAL, CKMB, CKMBINDEX, TROPONINI in the last 168 hours. BNP (last 3 results) No results for input(s): PROBNP in the last 8760 hours. HbA1C: No results for input(s): HGBA1C in the last 72 hours. CBG: Recent Labs  Lab 02/01/24 2201  GLUCAP 101*   Lipid Profile: No results for input(s): CHOL,  HDL, LDLCALC, TRIG, CHOLHDL, LDLDIRECT in the last 72 hours. Thyroid Function Tests: No results for input(s): TSH, T4TOTAL, FREET4, T3FREE, THYROIDAB in the last 72 hours. Anemia Panel: No results for input(s): VITAMINB12, FOLATE, FERRITIN, TIBC, IRON, RETICCTPCT in the last 72 hours. Sepsis Labs: No results for input(s): PROCALCITON, LATICACIDVEN in the last 168 hours.  Recent Results (from the past 240 hours)  Resp panel by RT-PCR (RSV, Flu A&B, Covid) Anterior Nasal Swab     Status: None   Collection Time: 01/24/24  5:31 PM   Specimen: Anterior Nasal Swab  Result Value Ref Range Status   SARS Coronavirus 2 by RT PCR NEGATIVE NEGATIVE Final    Comment: (NOTE) SARS-CoV-2 target nucleic acids are NOT DETECTED.  The SARS-CoV-2 RNA is generally detectable in upper respiratory specimens during the acute phase of infection. The lowest concentration of SARS-CoV-2 viral copies this assay can detect is 138 copies/mL. A negative result does not preclude SARS-Cov-2 infection and should not be used as the sole basis for treatment or other patient management decisions. A negative result may occur with  improper specimen collection/handling, submission of specimen other than nasopharyngeal swab, presence of viral mutation(s) within the areas targeted by this assay, and inadequate number of viral copies(<138 copies/mL). A negative result must be combined with clinical observations, patient history, and epidemiological information. The expected result is Negative.  Fact Sheet for Patients:  BloggerCourse.com  Fact Sheet for Healthcare Providers:  SeriousBroker.it  This test is no t yet approved or cleared by the [place]  FDA and  has been authorized for detection and/or diagnosis of SARS-CoV-2 by FDA under an Emergency Use Authorization (EUA). This EUA will remain  in effect (meaning this test can be  used) for the duration of the COVID-19 declaration under Section 564(b)(1) of the Act, 21 U.S.C.section 360bbb-3(b)(1), unless the authorization is terminated  or revoked sooner.       Influenza A by PCR NEGATIVE NEGATIVE Final   Influenza B by PCR NEGATIVE NEGATIVE Final    Comment: (NOTE) The Xpert Xpress SARS-CoV-2/FLU/RSV plus assay is intended as an aid in the diagnosis of influenza from Nasopharyngeal swab specimens and should not be used as a sole basis for treatment. Nasal washings and aspirates are unacceptable for Xpert Xpress SARS-CoV-2/FLU/RSV testing.  Fact Sheet for Patients: BloggerCourse.com  Fact Sheet for Healthcare Providers: SeriousBroker.it  This test is not yet approved or cleared by the [place]  FDA and has been authorized for detection and/or diagnosis of SARS-CoV-2 by FDA under an Emergency Use Authorization (EUA). This EUA will remain in effect (meaning this test can be used) for the duration of the COVID-19 declaration under Section 564(b)(1) of the Act, 21 U.S.C. section 360bbb-3(b)(1), unless the authorization is terminated or revoked.  Resp Syncytial Virus by PCR NEGATIVE NEGATIVE Final    Comment: (NOTE) Fact Sheet for Patients: BloggerCourse.com  Fact Sheet for Healthcare Providers: SeriousBroker.it  This test is not yet approved or cleared by the [place]  FDA and has been authorized for detection and/or diagnosis of SARS-CoV-2 by FDA under an Emergency Use Authorization (EUA). This EUA will remain in effect (meaning this test can be used) for the duration of the COVID-19 declaration under Section 564(b)(1) of the Act, 21 U.S.C. section 360bbb-3(b)(1), unless the authorization is terminated or revoked.  Performed at [facility], [address], [place], [place] [address]   Urine Culture     Status: Abnormal    Collection Time: 01/31/24 12:06 PM   Specimen: Urine, Random  Result Value Ref Range Status   Specimen Description   Final    URINE, RANDOM Performed at [facility], [address] [place], [place] [address]    Special Requests   Final    NONE Reflexed from [id number] Performed at [facility], [address] [place], [place] [address]    Culture 50,000 COLONIES/mL STREPTOCOCCUS MITIS/ORALIS (A)  Final   Report Status 02/03/2024 FINAL  Final   Organism ID, Bacteria STREPTOCOCCUS MITIS/ORALIS (A)  Final      Susceptibility   Streptococcus mitis/oralis - MIC*    PENICILLIN 2 INTERMEDIATE Intermediate     CEFTRIAXONE  4 RESISTANT Resistant     LEVOFLOXACIN >=16 RESISTANT Resistant     VANCOMYCIN  0.5 SENSITIVE Sensitive     * 50,000 COLONIES/mL STREPTOCOCCUS MITIS/ORALIS         Radiology Studies: MR BRAIN WO CONTRAST Result Date: 02/01/2024 EXAM: MRI BRAIN WITHOUT CONTRAST 02/01/2024 07:39:00 PM TECHNIQUE: Multiplanar multisequence MRI of the head/brain was performed without the administration of intravenous contrast. COMPARISON: Head CT 01/31/2024. CLINICAL HISTORY: Subdural hematoma; also intraparenchymal hematoma. Intracranial hematoma following injury, without loss of consciousness, initial encounter. Intraparenchymal hematoma of brain. 2 falls approximately 2 weeks ago which no intraparenchymal hematoma was noted. No acute surgery needs at this time as per neuro surg. MRI brain ordered. Neuro surg following and recs apprec. FINDINGS: BRAIN AND VENTRICLES: Intraparenchymal hematoma in the inferior right temporal lobe, unchanged in appearance compared to the prior CT. Small left frontal subdural hematoma, also unchanged. Small amount of subdural blood along the falx cerebri, stable. Trace subdural blood at the posterior right convexity, not visible by CT. No acute infarct. No midline shift. No hydrocephalus. ORBITS: Chronic blood products in the posterior left globe.  SINUSES AND MASTOIDS: No acute abnormality. BONES AND SOFT TISSUES: Normal marrow signal. No acute soft tissue abnormality. IMPRESSION: 1. Unchanged intraparenchymal hematoma in the inferior right temporal lobe. 2. Unchanged small left frontal subdural hematoma. 3. Trace subdural blood at the posterior right convexity, not visible by CT. Electronically signed by: Franky Stanford MD 02/01/2024 08:13 PM EDT RP Workstation: [id number]   DG Abd 1 View Result Date: 02/01/2024 CLINICAL DATA:  352040 Encounter for imaging to screen for metal prior to magnetic resonance imaging (MRI) 352040. EXAM: ABDOMEN - 1 VIEW COMPARISON:  10/29/2021. FINDINGS: The bowel gas pattern is non-obstructive. No evidence of pneumoperitoneum. There is subacute slightly displaced fracture of the lateral aspect of right sixth rib. No other acute osseous abnormalities. The soft tissues are within normal limits. Surgical changes, devices, tubes and lines: None. No metallic foreign body. IMPRESSION: 1. No metallic foreign body. 2. Subacute slightly displaced fracture of the lateral aspect of right sixth rib. Electronically Signed   By:  Ree Molt M.D.   On: 02/01/2024 17:59   DG Chest Port 1 View Result Date: 02/01/2024 CLINICAL DATA:  352040 Encounter for imaging to screen for metal prior to magnetic resonance imaging (MRI) 352040. EXAM: PORTABLE CHEST 1 VIEW COMPARISON:  08/15/2023. FINDINGS: Stable mild prominence of interstitial markings. No frank pulmonary edema. Bilateral lung fields are otherwise clear. There is subtle blunting bilateral lateral costophrenic angles, which may represent trace pleural effusions. No pneumothorax on either side. Stable cardio-mediastinal silhouette. No acute osseous abnormalities. The soft tissues are within normal limits. No metallic foreign body. IMPRESSION: No metallic foreign body. Electronically Signed   By: Ree Molt M.D.   On: 02/01/2024 17:56        Scheduled Meds:  (feeding  supplement) PROSource Plus  30 mL Oral BID BM   bisacodyl   10 mg Oral QHS   calcitRIOL   0.25 mcg Oral Daily   DULoxetine   30 mg Oral Daily   epoetin  alfa-epbx (RETACRIT ) injection  4,000 Units Intravenous Q M,W,F-1800   famotidine   10 mg Oral QHS   fluticasone  furoate-vilanterol  1 puff Inhalation Daily   melatonin  5 mg Oral QHS   metoprolol  tartrate  12.5 mg Oral BID   multivitamin  1 tablet Oral QHS   pantoprazole   40 mg Oral Daily   polyethylene glycol  17 g Oral BID   sevelamer  carbonate  800 mg Oral TID WC   traZODone   75 mg Oral QHS   Continuous Infusions:  cefTRIAXone  (ROCEPHIN )  IV 1 g (02/02/24 0957)     LOS: 0 days       Anthony CHRISTELLA Pouch, MD [facility] Pager [phone]  If 7PM-7AM, please contact night-coverage [email or web address]  02/03/2024, 9:41 AM

## 2024-02-03 NOTE — Telephone Encounter (Signed)
 Claudene Penne ORN, MD  [facility] Admin Clinic: [facility] Timeline: 2-4 wks Tests to order: repeat head ct

## 2024-02-03 NOTE — Plan of Care (Signed)

## 2024-02-03 NOTE — Progress Notes (Signed)
 OT Cancellation Note  Patient Details Name: Katie [name] MRN: [id number] DOB: 1955-07-24   Cancelled Treatment:    Reason Eval/Treat Not Completed: Patient at procedure or test/ unavailable. Pt currently off the unit at HD, OT will continue to follow and see as able.  Champ Keetch L. Halim Surrette, OTR/L  02/03/24, 8:25 AM

## 2024-02-03 NOTE — TOC Progression Note (Addendum)
 Transition of Care [facility]) - Progression Note    Patient Details  Name: Katie [name] MRN: [id number] Date of Birth: 10/17/1955  Transition of Care [facility]) CM/SW Contact  Dalia GORMAN Fuse, RN Phone Number: 02/03/2024, 3:18 PM  Clinical Narrative:      The patient is from [facility] TOC placed call to [facility] and followed the prompts to speak with someone. TOC lvmm for administrator Sherlean requesting a callback to confirm they can accept the patient back at the facility.  TOC called the facility a second time and spoke with the nurse tech. The tech confirmed they can accept the patient back tomorrow. They will need the FL2 and dc summary emailed to [email or web address]                   Expected Discharge Plan and Services                                               Social Drivers of Health (SDOH) Interventions SDOH Screenings   Food Insecurity: No Food Insecurity (02/01/2024)  Housing: Low Risk  (02/01/2024)  Transportation Needs: No Transportation Needs (02/01/2024)  Utilities: Not At Risk (02/01/2024)  Social Connections: Socially Isolated (02/01/2024)  Tobacco Use: Medium Risk (01/31/2024)    Readmission Risk Interventions    01/16/2024   10:27 AM 06/13/2022    4:02 PM 09/27/2021    1:44 PM  Readmission Risk Prevention Plan  Transportation Screening  Complete   HRI or Home Care Consult   --  Palliative Care Screening   Not Applicable  Medication Review (RN Care Manager)  Complete   PCP or Specialist appointment within 3-5 days of discharge Complete    HRI or Home Care Consult  Complete   SW Recovery Care/Counseling Consult Complete    Palliative Care Screening Not Applicable Not Applicable   Skilled Nursing Facility Not Applicable Not Applicable

## 2024-02-03 NOTE — Progress Notes (Signed)
 Hemodialysis Note:  Received patient in bed to unit. Alert and oriented. Informed consent singed and in chart.  Treatment initiated: 0800 Treatment completed: 1138  Access used: Left AVF Access issues: None  200 ml saline bolus was given due to patient feel nauseous. Patient tolerated well. Transported back to room, alert without acute distress. Report given to patient's RN.  Total UF removed: 2 liters Medications given: Zofran  4 mg IV, Albumin  12.5 gm IV, Retracrit 4000 units IV  Post HD weight: 53.7 kg  [facility]

## 2024-02-03 NOTE — Progress Notes (Signed)
 Pt receives outpt HD at [facility] MWF. Navigator following to assist with any HD needs.  Suzen Satchel Dialysis Navigator [phone]

## 2024-02-03 NOTE — Progress Notes (Signed)
 [facility]  ROUNDING NOTE   Subjective:   Patient seen and evaluated during dialysis   HEMODIALYSIS FLOWSHEET:  Blood Flow Rate (mL/min): 399 mL/min Arterial Pressure (mmHg): -171.91 mmHg Venous Pressure (mmHg): 185.65 mmHg TMP (mmHg): 7.07 mmHg Ultrafiltration Rate (mL/min): 829 mL/min Dialysate Flow Rate (mL/min): 299 ml/min  Tolerating treatment well  Objective:  Vital signs in last 24 hours:  Temp:  [98.2 F (36.8 C)-98.3 F (36.8 C)] 98.3 F (36.8 C) (09/22 0748) Pulse Rate:  [65-72] 70 (09/22 0900) Resp:  [13-18] 13 (09/22 0900) BP: (108-160)/(52-72) 137/56 (09/22 0900) SpO2:  [91 %-97 %] 93 % (09/22 0900) Weight:  [55.7 kg] 55.7 kg (09/22 0748)  Weight change:  Filed Weights   02/01/24 0551 02/01/24 1028 02/03/24 0748  Weight: 57.5 kg 57.2 kg 55.7 kg    Intake/Output: I/O last 3 completed shifts: In: 960 [P.O.:960] Out: -    Intake/Output this shift:  No intake/output data recorded.  Physical Exam: General: NAD  Head: Normocephalic  Eyes: Anicteric  Lungs:  Diminished, room air  Heart: Regular   Abdomen:  Soft  Extremities:  No peripheral edema.  Neurologic: Alert and oriented.  Skin: Scattered rashes  Access: Left AVF    Basic Metabolic Panel: Recent Labs  Lab 01/31/24 1206 02/01/24 0535 02/02/24 0316 02/03/24 0329  NA 139 138 136 132*  K 5.0 5.3* 4.2 4.8  CL 100 100 99 97*  CO2 25 21* 28 25  GLUCOSE 119* 70 92 86  BUN 93* 87* 59* 65*  CREATININE 5.47* 5.95* 4.62* 5.42*  CALCIUM  8.6* 8.6* 8.4* 8.3*  PHOS  --  6.2*  --   --     Liver Function Tests: Recent Labs  Lab 02/02/24 0316 02/03/24 0329  AST 91* 68*  ALT 131* 116*  ALKPHOS 88 92  BILITOT 0.7 0.6  PROT 5.5* 5.6*  ALBUMIN  2.6* 2.6*   No results for input(s): LIPASE, AMYLASE in the last 168 hours. No results for input(s): AMMONIA in the last 168 hours.  CBC: Recent Labs  Lab 01/31/24 1206 02/01/24 0535 02/02/24 0316 02/03/24 0329  WBC 7.2  5.1 6.5 6.0  NEUTROABS 5.3  --   --   --   HGB 10.3* 9.5* 8.9* 9.7*  HCT 32.8* 30.9* 28.0* 30.3*  MCV 102.8* 103.3* 102.2* 101.7*  PLT 110* 86* 79* 82*    Cardiac Enzymes: No results for input(s): CKTOTAL, CKMB, CKMBINDEX, TROPONINI in the last 168 hours.  BNP: Invalid input(s): POCBNP  CBG: Recent Labs  Lab 02/01/24 2201  GLUCAP 101*    Microbiology: Results for orders placed or performed during the hospital encounter of 01/31/24  Urine Culture     Status: Abnormal (Preliminary result)   Collection Time: 01/31/24 12:06 PM   Specimen: Urine, Random  Result Value Ref Range Status   Specimen Description   Final    URINE, RANDOM Performed at [facility], [address], [place], [place] [address]    Special Requests   Final    NONE Reflexed from [id number] Performed at [facility], [address] [place], [place] [address]    Culture (A)  Final    50,000 COLONIES/mL STREPTOCOCCUS MITIS/ORALIS SUSCEPTIBILITIES TO FOLLOW Performed at [facility], [address] [place], [place] [address]    Report Status PENDING  Incomplete    Coagulation Studies: No results for input(s): LABPROT, INR in the last 72 hours.  Urinalysis: Recent Labs    01/31/24 1206  COLORURINE YELLOW*  LABSPEC 1.014  PHURINE 6.0  GLUCOSEU NEGATIVE  HGBUR NEGATIVE  BILIRUBINUR NEGATIVE  KETONESUR NEGATIVE  PROTEINUR 100*  NITRITE NEGATIVE  LEUKOCYTESUR SMALL*      Imaging: MR BRAIN WO CONTRAST Result Date: 02/01/2024 EXAM: MRI BRAIN WITHOUT CONTRAST 02/01/2024 07:39:00 PM TECHNIQUE: Multiplanar multisequence MRI of the head/brain was performed without the administration of intravenous contrast. COMPARISON: Head CT 01/31/2024. CLINICAL HISTORY: Subdural hematoma; also intraparenchymal hematoma. Intracranial hematoma following injury, without loss of consciousness, initial encounter. Intraparenchymal hematoma of brain. 2 falls approximately 2 weeks ago  which no intraparenchymal hematoma was noted. No acute surgery needs at this time as per neuro surg. MRI brain ordered. Neuro surg following and recs apprec. FINDINGS: BRAIN AND VENTRICLES: Intraparenchymal hematoma in the inferior right temporal lobe, unchanged in appearance compared to the prior CT. Small left frontal subdural hematoma, also unchanged. Small amount of subdural blood along the falx cerebri, stable. Trace subdural blood at the posterior right convexity, not visible by CT. No acute infarct. No midline shift. No hydrocephalus. ORBITS: Chronic blood products in the posterior left globe. SINUSES AND MASTOIDS: No acute abnormality. BONES AND SOFT TISSUES: Normal marrow signal. No acute soft tissue abnormality. IMPRESSION: 1. Unchanged intraparenchymal hematoma in the inferior right temporal lobe. 2. Unchanged small left frontal subdural hematoma. 3. Trace subdural blood at the posterior right convexity, not visible by CT. Electronically signed by: Franky Stanford MD 02/01/2024 08:13 PM EDT RP Workstation: [id number]   DG Abd 1 View Result Date: 02/01/2024 CLINICAL DATA:  352040 Encounter for imaging to screen for metal prior to magnetic resonance imaging (MRI) 352040. EXAM: ABDOMEN - 1 VIEW COMPARISON:  10/29/2021. FINDINGS: The bowel gas pattern is non-obstructive. No evidence of pneumoperitoneum. There is subacute slightly displaced fracture of the lateral aspect of right sixth rib. No other acute osseous abnormalities. The soft tissues are within normal limits. Surgical changes, devices, tubes and lines: None. No metallic foreign body. IMPRESSION: 1. No metallic foreign body. 2. Subacute slightly displaced fracture of the lateral aspect of right sixth rib. Electronically Signed   By: Ree Molt M.D.   On: 02/01/2024 17:59   DG Chest Port 1 View Result Date: 02/01/2024 CLINICAL DATA:  352040 Encounter for imaging to screen for metal prior to magnetic resonance imaging (MRI) 352040. EXAM:  PORTABLE CHEST 1 VIEW COMPARISON:  08/15/2023. FINDINGS: Stable mild prominence of interstitial markings. No frank pulmonary edema. Bilateral lung fields are otherwise clear. There is subtle blunting bilateral lateral costophrenic angles, which may represent trace pleural effusions. No pneumothorax on either side. Stable cardio-mediastinal silhouette. No acute osseous abnormalities. The soft tissues are within normal limits. No metallic foreign body. IMPRESSION: No metallic foreign body. Electronically Signed   By: Ree Molt M.D.   On: 02/01/2024 17:56     Medications:    cefTRIAXone  (ROCEPHIN )  IV 1 g (02/02/24 0957)    (feeding supplement) PROSource Plus  30 mL Oral BID BM   bisacodyl   10 mg Oral QHS   calcitRIOL   0.25 mcg Oral Daily   DULoxetine   30 mg Oral Daily   epoetin  alfa-epbx (RETACRIT ) injection  4,000 Units Intravenous Q M,W,F-1800   famotidine   10 mg Oral QHS   fluticasone  furoate-vilanterol  1 puff Inhalation Daily   melatonin  5 mg Oral QHS   metoprolol  tartrate  12.5 mg Oral BID   multivitamin  1 tablet Oral QHS   pantoprazole   40 mg Oral Daily   polyethylene glycol  17 g Oral BID  sevelamer  carbonate  800 mg Oral TID WC   traZODone   75 mg Oral QHS   acetaminophen  **OR** acetaminophen , albuterol , guaiFENesin -dextromethorphan , HYDROmorphone  (DILAUDID ) injection, labetalol , midodrine , ondansetron  **OR** ondansetron  (ZOFRAN ) IV, oxyCODONE , topiramate   Assessment/ Plan:  Ms. Azalya Galyon is a 68 y.o.  female with past medical history of ESRD with hemodialysis, diabetes mellitus, COPD, and hypertension. Patient presents to ED from outpatient dialysis for decreased level of conscience.  [facility]/MWF/Lt AVF   End stage renal disease on hemodialysis. Receiving dialysis today, UF goal 1-2L as tolerated. Next treatment scheduled for Wednesday.    2.  Decreased level of consciousness, intraparenchymal hematoma of the brain             She was found on dialysis  with depressed respirations and decreased level of consciousness. In the ED CT imaging demonstrated a intraparenchymal hematoma.  Neurology and neurosurgery has been consulted. No surgical interventions determined at this time. Mentation has returned to baseline     3. Anemia of chronic kidney disease Hemoglobin & Hematocrit     Component Value Date/Time   HGB 9.7 (L) 02/03/2024 0329   HGB 11.7 08/06/2023 1545   HCT 30.3 (L) 02/03/2024 0329   HCT 35.1 08/06/2023 1545   Continue low dose Retacrit  4000 units with dialysis.     4. Secondary Hyperparathyroidism: with outpatient labs: PTH 507, phosphorus 3.9, calcium  8.5 on 12/23/23.  Calcium  acceptable. Awaiting updated Phos. Continue sevelamer  with meals.    5. Hypoalbuminemia Patient is on IDPN outpatient. Appreciate recs from dietician. Albumin  decreased but stable, 2.6     LOS: 0 Nishanth Mccaughan 9/22/20259:18 AM

## 2024-02-04 DIAGNOSIS — S062X0A Diffuse traumatic brain injury without loss of consciousness, initial encounter: Secondary | ICD-10-CM | POA: Diagnosis not present

## 2024-02-04 LAB — CBC
HCT: 35.1 % — ABNORMAL LOW (ref 36.0–46.0)
Hemoglobin: 11.1 g/dL — ABNORMAL LOW (ref 12.0–15.0)
MCH: 32.5 pg (ref 26.0–34.0)
MCHC: 31.6 g/dL (ref 30.0–36.0)
MCV: 102.6 fL — ABNORMAL HIGH (ref 80.0–100.0)
Platelets: 118 K/uL — ABNORMAL LOW (ref 150–400)
RBC: 3.42 MIL/uL — ABNORMAL LOW (ref 3.87–5.11)
RDW: 13.3 % (ref 11.5–15.5)
WBC: 6.9 K/uL (ref 4.0–10.5)
nRBC: 0 % (ref 0.0–0.2)

## 2024-02-04 LAB — COMPREHENSIVE METABOLIC PANEL WITH GFR
ALT: 121 U/L — ABNORMAL HIGH (ref 0–44)
AST: 72 U/L — ABNORMAL HIGH (ref 15–41)
Albumin: 3 g/dL — ABNORMAL LOW (ref 3.5–5.0)
Alkaline Phosphatase: 110 U/L (ref 38–126)
Anion gap: 11 (ref 5–15)
BUN: 33 mg/dL — ABNORMAL HIGH (ref 8–23)
CO2: 28 mmol/L (ref 22–32)
Calcium: 9.2 mg/dL (ref 8.9–10.3)
Chloride: 96 mmol/L — ABNORMAL LOW (ref 98–111)
Creatinine, Ser: 3.75 mg/dL — ABNORMAL HIGH (ref 0.44–1.00)
GFR, Estimated: 13 mL/min — ABNORMAL LOW
Glucose, Bld: 86 mg/dL (ref 70–99)
Potassium: 4.1 mmol/L (ref 3.5–5.1)
Sodium: 135 mmol/L (ref 135–145)
Total Bilirubin: 0.9 mg/dL (ref 0.0–1.2)
Total Protein: 6.7 g/dL (ref 6.5–8.1)

## 2024-02-04 LAB — HEPATITIS B SURFACE ANTIBODY, QUANTITATIVE: Hep B S AB Quant (Post): 23 m[IU]/mL

## 2024-02-04 MED ORDER — OXYCODONE HCL 5 MG PO CAPS: 5.0000 mg | ORAL_CAPSULE | Freq: Four times a day (QID) | ORAL | 0 refills | Status: AC | PRN

## 2024-02-04 NOTE — Progress Notes (Signed)
 D/C order noted.  Contacted [facility] MWF to advised of pt and d/c today that the pt should resume care on 02/05/24. Requested documents faxed to clinic for continuation of care.  Suzen Satchel Dialysis Navigator [phone]

## 2024-02-04 NOTE — Telephone Encounter (Signed)
Still hospitalized

## 2024-02-04 NOTE — Progress Notes (Signed)
 Attempted to call [facility] multiple times and was left on hold with no answer for >30mins each time.  AVS left in packet to be sent with [facility] to take to facility.

## 2024-02-04 NOTE — Progress Notes (Signed)
 Mobility Specialist Progress Note:    02/04/24 0757  Mobility  Activity Dangled on edge of bed  Level of Assistance Minimal assist, patient does 75% or more  Range of Motion/Exercises Active;All extremities  Activity Response Tolerated well  Mobility visit 1 Mobility  Mobility Specialist Start Time (ACUTE ONLY) 0744  Mobility Specialist Stop Time (ACUTE ONLY) 0757  Mobility Specialist Time Calculation (min) (ACUTE ONLY) 13 min   Pt received in bed, agreeable to mobility. Required MinA for bed mobility, pt refused getting OOB or ambulation. Upon sitting EOB, pt states I'm going to fall backwards I can't breathe. SpO2 92% on RA. Returned pt supine, anxiousness subsides. Bed alarm on, RN notified. All needs met.  Sherrilee Ditty Mobility Specialist Please contact via Special educational needs teacher or  Rehab office at [phone]

## 2024-02-04 NOTE — Plan of Care (Signed)

## 2024-02-04 NOTE — Discharge Summary (Addendum)
 Physician Discharge Summary  Katie [name] FMW:[id number] DOB: February 01, 1956 DOA: 01/31/2024  PCP: Cordella Corning, FNP  Admit date: 01/31/2024 Discharge date: 02/04/2024  Admitted From: [facility] Disposition:  [facility] w/ home health   Recommendations for Outpatient Follow-up:  Follow up with PCP in 1-2 weeks F/u w/ neuro surg, Dr. WENDI Sharps, in 2 weeks for repeat CT head  Home Health: yes Equipment/Devices:  Discharge Condition: stable CODE STATUS: DNR Diet recommendation: Heart Healthy   Brief/Interim Summary: HPI was taken from Dr. Jhonny: Katie [name] is a 68 y.o. female with medical history significant of COPD, hypertension, ESRD on HD Monday Wednesday Friday sent to the ED from dialysis center due to decreased level of awareness during dialysis session today.  Also apparently dialysis staff worried the respiratory rate has decreased.   On arrival CT imaging demonstrated a intraparenchymal hematoma.  Neurology and neurosurgery aware.  By the time that the patient had been transported from her dialysis center to the emergency room her mental status had recovered.  She states that she gets severe blood pressure drops during dialysis days.   Patient was endorsing headaches mostly worsening over the past month.  Last head trauma was secondary to a fall 2 weeks ago and head CT at that time did not show intraparenchymal bleed.  Patient denies any seizure-like activity.   Vital signs stable.  Neurology and neurosurgery consulted prior to my evaluation.   ED Course: EDP spoke with neurology and neurosurgery.  Neurosurgery recommends close monitoring with serial CT scans 8 hours apart.  No recommendations from neurology as this is likely a traumatic bleed.   Discharge Diagnoses:  Principal Problem:   Intracranial hematoma following injury, without loss of consciousness, initial encounter [facility]) Active Problems:   Intraparenchymal hematoma of brain (HCC)  Intraparenchymal  hematoma of the brain: recent fall within last week as per pt today. No acute surgery needs at this time as per neuro surg. MRI brain shows stable intraparenchymal hematoma, unchanged small left frontal subdural hematoma & trace subdural blood at the posterior right convexity, not visible by CT. Will need to f/u outpatient w/ neuro surg in 2 weeks for repeat CT. Neuro recs apprec   Generalized weakness: PT/OT recs SNF   ESRD: on HD MWF. Nephro following and recs apprec   Hyperkalemia: WNL today    HTN: continue on metoprolol     COPD: w/o exacerbation. Continue on bronchodilators    GERD: continue on PPI, pepcid    Thrombocytopenia: etiology unclear, labile. Will continue to monitor   Macrocytic anemia: B12 427 on 01/14/24. No need for a transfusion currently   Discharge Instructions  Discharge Instructions     Diet - low sodium heart healthy   Complete by: As directed    Discharge instructions   Complete by: As directed    F/u w/ neuro surg, Dr. WENDI Sharps, in 2 weeks. F/u w/ PCP in 1-2 weeks   Increase activity slowly   Complete by: As directed    No wound care   Complete by: As directed       Allergies as of 02/04/2024       Reactions   Penicillins Nausea And Vomiting   Pt states only pills make her vomit Other reaction(s): Nausea/Vomit        Medication List     STOP taking these medications    ondansetron  4 MG disintegrating tablet Commonly known as: ZOFRAN -ODT       TAKE these medications    acetaminophen  500  MG tablet Commonly known as: TYLENOL  Take 500 mg by mouth 3 (three) times daily. Take additional 500 mg every 6 hours as needed for pain   Advair Diskus 250-50 MCG/ACT Aepb Generic drug: fluticasone -salmeterol Inhale 1 puff into the lungs 2 (two) times daily.   albuterol  108 (90 Base) MCG/ACT inhaler Commonly known as: VENTOLIN  HFA Inhale 1 puff into the lungs every 6 (six) hours as needed for wheezing.   aspirin  81 MG chewable tablet Chew  81 mg by mouth daily.   bisacodyl  5 MG EC tablet Commonly known as: DULCOLAX Take 2 tablets (10 mg total) by mouth at bedtime.   calcitRIOL  0.25 MCG capsule Commonly known as: ROCALTROL  Take 1 capsule (0.25 mcg total) by mouth daily.   calcium  carbonate 500 MG chewable tablet Commonly known as: TUMS - dosed in mg elemental calcium  Chew 1 tablet by mouth 3 (three) times daily as needed (GRED).   DULoxetine  30 MG capsule Commonly known as: CYMBALTA  Take 30 mg by mouth daily.   famotidine  20 MG tablet Commonly known as: PEPCID  Take 10 mg by mouth at bedtime.   guaiFENesin -dextromethorphan  100-10 MG/5ML syrup Commonly known as: ROBITUSSIN DM Take 10 mLs by mouth every 6 (six) hours as needed for cough.   loperamide  2 MG tablet Commonly known as: IMODIUM  A-D Take 2 mg by mouth. TAKE 1 TABLET BY MOUTH ONCE DAILY AS NEEDED FOR LOOSE STOOLS OR DIARRHEA   melatonin 3 MG Tabs tablet Take 3 mg by mouth at bedtime.   metoprolol  tartrate 25 MG tablet Commonly known as: LOPRESSOR  Take 1 tablet (25 mg total) by mouth 2 (two) times daily as needed (SBP >150).   midodrine  5 MG tablet Commonly known as: PROAMATINE  Take 5 mg by mouth. Monday, Wednesday, Friday prior to dialysis   ondansetron  4 MG tablet Commonly known as: ZOFRAN  Take 4 mg by mouth every Monday, Wednesday, and Friday.   oxycodone  5 MG capsule Commonly known as: OXY-IR Take 1 capsule (5 mg total) by mouth every 6 (six) hours as needed for up to 1 day (moderate and/or severe pain). What changed:  when to take this reasons to take this   pantoprazole  40 MG tablet Commonly known as: PROTONIX  Take 40 mg by mouth daily.   polyethylene glycol 17 g packet Commonly known as: MIRALAX  / GLYCOLAX  Take 17 g by mouth 2 (two) times daily.   Sarna Sensitive 1 % Lotn Generic drug: pramoxine Apply 1 Application topically 2 (two) times daily.   tiZANidine  2 MG tablet Commonly known as: ZANAFLEX  Take 2 mg by mouth at  bedtime.   topiramate  50 MG tablet Commonly known as: TOPAMAX  Take 1 tablet (50 mg total) by mouth 2 (two) times daily as needed (headache).   traZODone  50 MG tablet Commonly known as: DESYREL  Take 75 mg by mouth at bedtime.        Follow-up Information     Cordella Corning, FNP Follow up.   Specialty: Family Medicine Why: [name] follow up  N answer at office patient to make own follow up appt Contact information: [address] [place] [address] [phone]                Allergies  Allergen Reactions   Penicillins Nausea And Vomiting    Pt states only pills make her vomit Other reaction(s): Nausea/Vomit    Consultations: Neuro surg    Procedures/Studies: MR BRAIN WO CONTRAST Result Date: 02/01/2024 EXAM: MRI BRAIN WITHOUT CONTRAST 02/01/2024 07:39:00  PM TECHNIQUE: Multiplanar multisequence MRI of the head/brain was performed without the administration of intravenous contrast. COMPARISON: Head CT 01/31/2024. CLINICAL HISTORY: Subdural hematoma; also intraparenchymal hematoma. Intracranial hematoma following injury, without loss of consciousness, initial encounter. Intraparenchymal hematoma of brain. 2 falls approximately 2 weeks ago which no intraparenchymal hematoma was noted. No acute surgery needs at this time as per neuro surg. MRI brain ordered. Neuro surg following and recs apprec. FINDINGS: BRAIN AND VENTRICLES: Intraparenchymal hematoma in the inferior right temporal lobe, unchanged in appearance compared to the prior CT. Small left frontal subdural hematoma, also unchanged. Small amount of subdural blood along the falx cerebri, stable. Trace subdural blood at the posterior right convexity, not visible by CT. No acute infarct. No midline shift. No hydrocephalus. ORBITS: Chronic blood products in the posterior left globe. SINUSES AND MASTOIDS: No acute abnormality. BONES AND SOFT TISSUES: Normal marrow signal. No acute soft tissue  abnormality. IMPRESSION: 1. Unchanged intraparenchymal hematoma in the inferior right temporal lobe. 2. Unchanged small left frontal subdural hematoma. 3. Trace subdural blood at the posterior right convexity, not visible by CT. Electronically signed by: Franky Stanford MD 02/01/2024 08:13 PM EDT RP Workstation: [id number]   DG Abd 1 View Result Date: 02/01/2024 CLINICAL DATA:  352040 Encounter for imaging to screen for metal prior to magnetic resonance imaging (MRI) 352040. EXAM: ABDOMEN - 1 VIEW COMPARISON:  10/29/2021. FINDINGS: The bowel gas pattern is non-obstructive. No evidence of pneumoperitoneum. There is subacute slightly displaced fracture of the lateral aspect of right sixth rib. No other acute osseous abnormalities. The soft tissues are within normal limits. Surgical changes, devices, tubes and lines: None. No metallic foreign body. IMPRESSION: 1. No metallic foreign body. 2. Subacute slightly displaced fracture of the lateral aspect of right sixth rib. Electronically Signed   By: Ree Molt M.D.   On: 02/01/2024 17:59   DG Chest Port 1 View Result Date: 02/01/2024 CLINICAL DATA:  352040 Encounter for imaging to screen for metal prior to magnetic resonance imaging (MRI) 352040. EXAM: PORTABLE CHEST 1 VIEW COMPARISON:  08/15/2023. FINDINGS: Stable mild prominence of interstitial markings. No frank pulmonary edema. Bilateral lung fields are otherwise clear. There is subtle blunting bilateral lateral costophrenic angles, which may represent trace pleural effusions. No pneumothorax on either side. Stable cardio-mediastinal silhouette. No acute osseous abnormalities. The soft tissues are within normal limits. No metallic foreign body. IMPRESSION: No metallic foreign body. Electronically Signed   By: Ree Molt M.D.   On: 02/01/2024 17:56   CT HEAD WO CONTRAST ( ) Result Date: 01/31/2024 EXAM: CT HEAD WITHOUT CONTRAST 01/31/2024 08:25:41 PM TECHNIQUE: CT of the head was performed without the  administration of intravenous contrast. Automated exposure control, iterative reconstruction, and/or weight based adjustment of the mA/kV was utilized to reduce the radiation dose to as low as reasonably achievable. COMPARISON: 01/31/2024 01:40:00 PM CLINICAL HISTORY: Follow up [facility]. FINDINGS: BRAIN AND VENTRICLES: Unchanged appearance of intraparenchymal hematoma in the right posterior temporal lobe. There is a small subdural hematoma along the left anterior convexity that was not visible on the prior scan. This measures 4 mm in thickness. No mass effect or midline shift. Mild volume loss. ORBITS: No acute abnormality. SINUSES: No acute abnormality. SOFT TISSUES AND SKULL: No acute soft tissue abnormality. No skull fracture. VASCULATURE: ICA atherosclerosis. IMPRESSION: 1. Unchanged intraparenchymal hematoma in the right posterior temporal lobe. 2. Small left anterior convexity subdural hematoma measuring 4 mm, not visible on prior, but the relatively low density suggests that it may be  subacute. Short interval follow-up recommended. Electronically signed by: Franky Stanford MD 01/31/2024 08:31 PM EDT RP Workstation: [id number]   CT Angio Head W or Wo Contrast Result Date: 01/31/2024 EXAM: CTA Head without and with Intravenous Contrast CLINICAL HISTORY: Cerebral aneurysm screening, high-risk. TECHNIQUE: Axial CTA images of the head without and with intravenous contrast. MIP reconstructed images were created and reviewed. Dose reduction technique was used including one or more of the following: automated exposure control, adjustment of mA and kV according to patient size, and/or iterative reconstruction. CONTRAST: 75mL (iohexol  (OMNIPAQUE ) 350 MG/ML injection 75 mL IOHEXOL  350 MG/ML SOLN) COMPARISON: MRA Head 06/25/2020 FINDINGS: INTERNAL CAROTID ARTERIES: The internal carotid arteries are patent from the included distal cervical segments through the termini with atherosclerotic calcifications resulting in mild  cavernous and proximal supraclinoid stenoses bilaterally. ANTERIOR CEREBRAL ARTERIES: ACAs are patent without evidence of a proximal branch occlusion or significant proximal stenosis. MIDDLE CEREBRAL ARTERIES: MCAs are patent without evidence of a proximal branch occlusion or significant proximal stenosis. POSTERIOR CEREBRAL ARTERIES: Both PCAs are patent without evidence of any significant proximal stenosis. There are robust posterior communicating arteries bilaterally with hypoplasia of the right greater than left P1 segments. BASILAR ARTERY: The basilar artery is widely patent. VERTEBRAL ARTERIES: The included distal vertebral arteries are patent to the basilar with the right being strongly dominant. Atherosclerotic calcification results in moderate to severe stenoses of the distal left V3 and V4 segments. The majority of the left V4 segment is now diffusely small in caliber which reflects a change from the prior MRA. No aneurysm or vascular malformation is identified. SOFT TISSUES: No acute finding. BONES: No acute osseous abnormality. VENOUS SINUSES: The major dural venous sinuses appear patent. IMPRESSION: 1. No large vessel occlusion or aneurysm. 2. 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 Electronically signed by: Dasie Hamburg MD 01/31/2024 05:23 PM EDT RP Workstation: [id number]   CT Head Wo Contrast Result Date: 01/31/2024 CLINICAL DATA:  Head trauma with mental status change. EXAM: CT HEAD WITHOUT CONTRAST TECHNIQUE: Contiguous axial images were obtained from the base of the skull through the vertex without intravenous contrast. RADIATION DOSE REDUCTION: This exam was performed according to the departmental dose-optimization program which includes automated exposure control, adjustment of the mA and/or kV according to patient size and/or use of iterative reconstruction technique. COMPARISON:  01/14/2024 FINDINGS:  Traumatic Brain Injury Risk Stratification Skull Fracture: No - Low/mBIG 1 Subdural Hematoma (SDH): Subtle amount of acute subdural hemorrhage along the posterior dependent superior and right-sided tentorium. Subarachnoid Hemorrhage [name]): No Epidural Hematoma (EDH): No - Low/mBIG 1 Cerebral contusion, intra-axial, intraparenchymal Hemorrhage (IPH): Acute intraparenchymal hemorrhage over the posterior right temporal lobe measuring approximately 1.8 x 1.8 x 0.9 cm in AP, transverse and craniocaudal dimension. BIG 3. Intraventricular Hemorrhage (IVH): No - Low/mBIG 1 Midline Shift > 1mm or Edema/effacement of sulci/vents: No - Low/mBIG 1 ---------------------------------------------------- Brain: Ventricles and cisterns are normal. Acute intraparenchymal hemorrhage as described over the posterior right temporal lobe with mild associated adjacent edema/local mass effect. No midline shift. Subtle acute hemorrhage along the posterior interhemispheric fissure and along the right tentorium. Vascular: No hyperdense vessel or unexpected calcification. Skull: No acute fracture. Sinuses/Orbits: No acute finding. Other: None. IMPRESSION: 1. Acute intraparenchymal hemorrhage over the posterior right temporal lobe measuring 1.8 x 1.8 x 0.9 cm with mild associated adjacent edema/local mass effect. No midline shift. Compatible with BIG-3 classification of mTBI. 2. Subtle acute hemorrhage along the posterior  interhemispheric fissure and along the right tentorium. Critical Value/emergent results were called by telephone at the time of interpretation on 01/31/2024 at 2:05 pm to provider PHILLIP STAFFORD , who verbally acknowledged these results. Electronically Signed   By: Toribio Agreste M.D.   On: 01/31/2024 14:08   ECHOCARDIOGRAM LIMITED Result Date: 01/15/2024    ECHOCARDIOGRAM LIMITED REPORT   Patient Name:   Katie [name] Medical Rec #:  [id number]    Height:       66.0 in Accession #:    [id number]   Weight:        120.8 lb Date of Birth:  Sep 12, 1955   BSA:          1.614 m Patient Age:    67 years     BP:           160/62 mmHg Patient Gender: F            HR:           92 bpm. Exam Location:  [facility] Procedure: 2D Echo, Limited Echo, 3D Echo, Limited Color Doppler, Cardiac            Doppler and Strain Analysis (Both Spectral and Color Flow Doppler            were utilized during procedure). Indications:     I35.0 Aortic Stenosis  History:         Patient has prior history of Echocardiogram examinations, most                  recent 06/20/2023. COPD; Risk Factors:Diabetes, Former Smoker and                  Hypertension. End Stage Renal Disease-Hemodialysis.  Sonographer:     Carl Coma RDCS Referring Phys:  [id number] ELVAN SOR Diagnosing Phys: Lonni Hanson MD IMPRESSIONS  1. Left ventricular ejection fraction, by estimation, is 60 to 65%. Left ventricular ejection fraction by 3D volume is 52 %. The left ventricle has normal function. There is moderate left ventricular hypertrophy. Left ventricular diastolic parameters are indeterminate. The average left ventricular global longitudinal strain is -20.0 %. The global longitudinal strain is normal.  2. Right ventricular systolic function is normal. The right ventricular size is normal. Mildly increased right ventricular wall thickness. There is severely elevated pulmonary artery systolic pressure. The estimated right ventricular systolic pressure is 69.5 mmHg.  3. Trivial mitral valve regurgitation. Moderate mitral stenosis; transvalvular gradient likely accentuated by high-flow state. The mean mitral valve gradient is 10.0 mmHg.  4. Tricuspid valve regurgitation is mild to moderate.  5. The aortic valve has an indeterminant number of cusps. There is moderate calcification of the aortic valve. There is severe thickening of the aortic valve. Aortic valve regurgitation is moderate. Severe aortic valve stenosis. Aortic valve area, by VTI measures 0.71 cm. Aortic  valve mean gradient measures 47.0 mmHg.  6. The inferior vena cava is normal in size with <50% respiratory variability, suggesting right atrial pressure of 8 mmHg. FINDINGS  Left Ventricle: Left ventricular ejection fraction, by estimation, is 60 to 65%. Left ventricular ejection fraction by 3D volume is 52 %. The left ventricle has normal function. The average left ventricular global longitudinal strain is -20.0 %. Strain was performed and the global longitudinal strain is normal. The left ventricular internal cavity size was normal in size. There is moderate left ventricular hypertrophy. Left ventricular diastolic parameters are indeterminate. Right Ventricle: The right ventricular size  is normal. Mildly increased right ventricular wall thickness. Right ventricular systolic function is normal. There is severely elevated pulmonary artery systolic pressure. The tricuspid regurgitant velocity is 3.92 m/s, and with an assumed right atrial pressure of 8 mmHg, the estimated right ventricular systolic pressure is 69.5 mmHg. Pericardium: Trivial pericardial effusion is present. There is moderate thickening of the mitral valve leaflet(s). Trivial mitral valve regurgitation. Moderate mitral valve stenosis. Tricuspid Valve: The tricuspid valve is normal in structure. Tricuspid valve regurgitation is mild to moderate. The aortic valve has an indeterminant number of cusps. There is moderate calcification of the aortic valve. There is severe thickening of the aortic valve. Aortic valve regurgitation is moderate. Severe aortic stenosis is present. Aorta: The aortic root is normal in size and structure. Venous: The inferior vena cava is normal in size with less than 50% respiratory variability, suggesting right atrial pressure of 8 mmHg. Additional Comments: 3D was performed not requiring image post processing on an independent workstation and was normal.  LEFT VENTRICLE PLAX 2D LVIDd:         3.90 cm         Diastology LVIDs:          2.80 cm         LV e' medial:    5.22 cm/s LV PW:         1.50 cm         LV E/e' medial:  26.1 LV IVS:        1.40 cm         LV e' lateral:   5.44 cm/s LVOT diam:     1.80 cm         LV E/e' lateral: 25.1 LV SV:         60 LV SV Index:   37              2D Longitudinal LVOT Area:     2.54 cm        Strain                                2D Strain GLS   -19.1 %                                (A4C):                                2D Strain GLS   -20.7 %                                (A3C):                                2D Strain GLS   -20.2 %                                (A2C):                                2D Strain GLS   -20.0 %  Avg:                                 3D Volume EF                                LV 3D EF:    Left                                             ventricul                                             ar                                             ejection                                             fraction                                             by 3D                                             volume is                                             52 %.                                 3D Volume EF:                                3D EF:        52 %                                LV EDV:       149 ml                                LV ESV:       72 ml                                LV SV:        78 ml RIGHT VENTRICLE  IVC RV S prime:     14.00 cm/s  IVC diam: 1.10 cm TAPSE (M-mode): 2.8 cm LEFT ATRIUM         Index LA diam:    4.60 cm 2.85 cm/m  AORTIC VALVE AV Area (Vmax):    0.76 cm AV Area (Vmean):   0.67 cm AV Area (VTI):     0.71 cm AV Vmax:           406.60 cm/s AV Vmean:          314.200 cm/s AV VTI:            0.851 m AV Peak Grad:      66.1 mmHg AV Mean Grad:      47.0 mmHg LVOT Vmax:         121.00 cm/s LVOT Vmean:        83.267 cm/s LVOT VTI:          0.237 m LVOT/AV VTI ratio: 0.28 AI PHT:            322 msec  AORTA Ao Root  diam: 3.20 cm MITRAL VALVE                TRICUSPID VALVE MV Area (PHT): 4.10 cm     TR Peak grad:   61.5 mmHg MV Area VTI:   1.46 cm     TR Vmax:        392.00 cm/s MV Peak grad:  19.9 mmHg MV Mean grad:  10.0 mmHg    SHUNTS MV Vmax:       2.23 m/s     Systemic VTI:  0.24 m MV Vmean:      148.0 cm/s   Systemic Diam: 1.80 cm MV Decel Time: 185 msec MV E velocity: 136.50 cm/s MV A velocity: 203.00 cm/s MV E/A ratio:  0.67 Lonni End MD Electronically signed by Lonni Hanson MD Signature Date/Time: 01/15/2024/7:12:29 AM    Final    CT HEAD WO CONTRAST ( ) Result Date: [name] CLINICAL DATA:  Neck trauma (Age >= 65y); Head trauma, minor (Age >= 65y). Dizziness, syncope, fall EXAM: CT HEAD WITHOUT CONTRAST CT CERVICAL SPINE WITHOUT CONTRAST TECHNIQUE: Multidetector CT imaging of the head and cervical spine was performed following the standard protocol without intravenous contrast. Multiplanar CT image reconstructions of the cervical spine were also generated. RADIATION DOSE REDUCTION: This exam was performed according to the departmental dose-optimization program which includes automated exposure control, adjustment of the mA and/or kV according to patient size and/or use of iterative reconstruction technique. COMPARISON:  None Available. FINDINGS: CT HEAD FINDINGS Brain: Normal anatomic configuration. Parenchymal volume loss is commensurate with the patient's age. Mild periventricular white matter changes are present likely reflecting the sequela of small vessel ischemia. No abnormal intra or extra-axial mass lesion or fluid collection. No abnormal mass effect or midline shift. No evidence of acute intracranial hemorrhage or infarct. Ventricular size is normal. Cerebellum unremarkable. Vascular: No asymmetric hyperdense vasculature at the skull base. Skull: Intact Sinuses/Orbits: Paranasal sinuses are clear. Orbits are unremarkable. Other: Mastoid air cells and middle ear cavities are clear. CT CERVICAL  SPINE FINDINGS Alignment: 3 mm retrolisthesis C6-7. Otherwise straightening of the cervical spine. Skull base and vertebrae: Craniocervical alignment is normal. The atlantodental interval is not widened. No acute fracture of the cervical spine. Vertebral body height is preserved. Degenerative ankylosis of C6-7 vertebral bodies. Soft tissues and spinal canal: No prevertebral fluid or swelling. No visible canal hematoma. Broad-based posterior disc osteophyte complex  at C5-6 results in moderate central canal stenosis with mild flattening of the thecal sac and AP diameter of the spinal canal of 7 mm. No high-grade canal stenosis. Advanced vascular calcifications are seen within the carotid bifurcations. Disc levels: Disc space narrowing and endplate remodeling is seen at C5-6 in keeping with changes of advanced degenerative disc disease. Degenerative ankylosis and obliteration of the C6-7 disc space. Milder degenerative changes noted at C4-5. Prevertebral soft tissues are not thickened on sagittal reformats. No significant neuroforaminal narrowing. Upper chest: Negative. Other: None IMPRESSION: 1. No acute intracranial abnormality. No calvarial fracture. 2. No acute fracture or listhesis of the cervical spine. 3. 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 4. Advanced vascular calcifications within the carotid bifurcations. Electronically Signed   By: Dorethia Molt M.D.   On: 01/14/2024 00:49   CT Cervical Spine Wo Contrast Result Date: [name] CLINICAL DATA:  Neck trauma (Age >= 65y); Head trauma, minor (Age >= 65y). Dizziness, syncope, fall EXAM: CT HEAD WITHOUT CONTRAST CT CERVICAL SPINE WITHOUT CONTRAST TECHNIQUE: Multidetector CT imaging of the head and cervical spine was performed following the standard protocol without intravenous contrast. Multiplanar CT image reconstructions of the cervical spine were also generated. RADIATION DOSE REDUCTION: This  exam was performed according to the departmental dose-optimization program which includes automated exposure control, adjustment of the mA and/or kV according to patient size and/or use of iterative reconstruction technique. COMPARISON:  None Available. FINDINGS: CT HEAD FINDINGS Brain: Normal anatomic configuration. Parenchymal volume loss is commensurate with the patient's age. Mild periventricular white matter changes are present likely reflecting the sequela of small vessel ischemia. No abnormal intra or extra-axial mass lesion or fluid collection. No abnormal mass effect or midline shift. No evidence of acute intracranial hemorrhage or infarct. Ventricular size is normal. Cerebellum unremarkable. Vascular: No asymmetric hyperdense vasculature at the skull base. Skull: Intact Sinuses/Orbits: Paranasal sinuses are clear. Orbits are unremarkable. Other: Mastoid air cells and middle ear cavities are clear. CT CERVICAL SPINE FINDINGS Alignment: 3 mm retrolisthesis C6-7. Otherwise straightening of the cervical spine. Skull base and vertebrae: Craniocervical alignment is normal. The atlantodental interval is not widened. No acute fracture of the cervical spine. Vertebral body height is preserved. Degenerative ankylosis of C6-7 vertebral bodies. Soft tissues and spinal canal: No prevertebral fluid or swelling. No visible canal hematoma. Broad-based posterior disc osteophyte complex at C5-6 results in moderate central canal stenosis with mild flattening of the thecal sac and AP diameter of the spinal canal of 7 mm. No high-grade canal stenosis. Advanced vascular calcifications are seen within the carotid bifurcations. Disc levels: Disc space narrowing and endplate remodeling is seen at C5-6 in keeping with changes of advanced degenerative disc disease. Degenerative ankylosis and obliteration of the C6-7 disc space. Milder degenerative changes noted at C4-5. Prevertebral soft tissues are not thickened on sagittal  reformats. No significant neuroforaminal narrowing. Upper chest: Negative. Other: None IMPRESSION: 1. No acute intracranial abnormality. No calvarial fracture. 2. No acute fracture or listhesis of the cervical spine. 3. 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 4. Advanced vascular calcifications within the carotid bifurcations. Electronically Signed   By: Dorethia Molt M.D.   On: 01/14/2024 00:49   CT Angio Chest PE W and/or Wo Contrast Result Date: [name] CLINICAL DATA:  High probability pulmonary embolism, syncope EXAM: CT ANGIOGRAPHY CHEST WITH CONTRAST TECHNIQUE: Multidetector CT imaging of the chest was performed using the standard  protocol during bolus administration of intravenous contrast. Multiplanar CT image reconstructions and MIPs were obtained to evaluate the vascular anatomy. RADIATION DOSE REDUCTION: This exam was performed according to the departmental dose-optimization program which includes automated exposure control, adjustment of the mA and/or kV according to patient size and/or use of iterative reconstruction technique. CONTRAST:  75mL OMNIPAQUE  IOHEXOL  350 MG/ML SOLN COMPARISON:  11/12/2023 FINDINGS: Cardiovascular: Advanced aortic and mitral valvular calcification. Extensive multi-vessel coronary artery calcification. Cardiac size is mildly enlarged. Adequate opacification of the pulmonary arterial tree. No intraluminal filling defect identified to suggest acute pulmonary embolism. Central pulmonary arteries are of normal caliber. No pericardial effusion. Mild atherosclerotic calcification within the thoracic aorta. Stable fusiform dilation of the ascending aorta measuring 4.1 cm in maximal diameter. Mediastinum/Nodes: No enlarged mediastinal, hilar, or axillary lymph nodes. Thyroid gland, trachea, and esophagus demonstrate no significant findings. Lungs/Pleura: Mosaic attenuation of the pulmonary parenchyma is in keeping  with multifocal air trapping related to small airways disease. There is extensive airway impaction left lower lobe with complete collapse of the left lower lobe. Mild right basilar dependent atelectasis. No pneumothorax. Trace right pleural effusion Upper Abdomen: Cholelithiasis without pericholecystic inflammatory change. No acute abnormality within the visualized upper abdomen. Musculoskeletal: No chest wall abnormality. No acute or significant osseous findings. Review of the MIP images confirms the above findings. IMPRESSION: 1. No pulmonary embolism. 2. Extensive airway impaction with complete collapse of the left lower lobe. 3. 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 4. Extensive multi-vessel coronary artery calcification. 5. Advanced aortic and mitral valvular calcification. Echocardiography may be helpful to assess the degree of valvular dysfunction 6. Stable fusiform dilation of the ascending aorta measuring 4.1 cm in maximal diameter. Recommend annual imaging followup by CTA or MRA. This recommendation follows 2010 ACCF/AHA/AATS/ACR/ASA/SCA/SCAI/SIR/STS/SVM Guidelines for the Diagnosis and Management of Patients with Thoracic Aortic Disease. Circulation. 2010; 121: Z733-z630. Aortic aneurysm NOS (ICD10-I71.9) 7. Cholelithiasis. 8. Aortic atherosclerosis. Aortic Atherosclerosis (ICD10-I70.0). Electronically Signed   By: Dorethia Molt M.D.   On: 01/14/2024 00:39   DG Elbow Complete Left Result Date: [name] CLINICAL DATA:  Fall, left elbow injury EXAM: LEFT ELBOW - COMPLETE 3+ VIEW COMPARISON:  None Available. FINDINGS: There is no evidence of fracture, dislocation, or joint effusion. There is no evidence of arthropathy or other focal bone abnormality. Vascular calcifications are noted. IMPRESSION: 1. Negative. Electronically Signed   By: Dorethia Molt M.D.   On: 01/14/2024 00:20   DG Wrist Complete Left Result Date:  [name] CLINICAL DATA:  Chest pain, dyspnea EXAM: LEFT WRIST - COMPLETE 3+ VIEW COMPARISON:  None Available. FINDINGS: Normal alignment. No acute fracture or dislocation. Mild polyarticular degenerative changes. Advanced vascular calcifications are noted. IMPRESSION: 1. Mild polyarticular degenerative change. Electronically Signed   By: Dorethia Molt M.D.   On: 01/14/2024 00:19   DG Hand Complete Left Result Date: [name] CLINICAL DATA:  Fall, left hand injury EXAM: LEFT HAND - COMPLETE 3+ VIEW COMPARISON:  None Available. FINDINGS: There is no evidence of fracture or dislocation. There is no evidence of arthropathy or other focal bone abnormality. Vascular calcifications are noted. IMPRESSION: 1. Negative. Electronically Signed   By: Dorethia Molt M.D.   On: 01/14/2024 00:16   CT Head Wo Contrast Result Date: 01/06/2024 EXAM: CT HEAD AND CERVICAL SPINE 01/06/2024 05:07:11 PM TECHNIQUE: CT of the head and cervical spine was performed without the administration of intravenous contrast. Multiplanar reformatted images are provided for  review. Automated exposure control, iterative reconstruction, and/or weight based adjustment of the mA/kV was utilized to reduce the radiation dose to as low as reasonably achievable. COMPARISON: CT head and cervical spine 06/12/2022. MRI head 06/25/2020. CLINICAL HISTORY: Fall. [employer]. FINDINGS: CT HEAD BRAIN AND VENTRICLES: Mild cerebral atrophy. Periventricular white matter hypodensities, similar to the prior CT and nonspecific but compatible with minimal chronic small vessel ischemic disease. No acute intracranial hemorrhage. No mass effect or midline shift. No abnormal extra-axial fluid collection. Gray-white differentiation is maintained. No hydrocephalus. 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 SINUSES AND MASTOIDS: No acute abnormality. SOFT TISSUES AND SKULL: Calcified atherosclerosis of the skull base. No acute skull fracture. No acute soft tissue abnormality. CT CERVICAL SPINE BONES AND ALIGNMENT: Chronic straightening of the normal cervical lordosis. Grade 1 retrolisthesis of C6 on C7. No acute fracture or suspicious lesion. DEGENERATIVE CHANGES: Moderate disc space narrowing at C5-6 and severe narrowing at C6-7 with partial fusion between the vertebral bodies at the latter. Broad-based posterior disc osteophyte complex at C5-6 results in mild spinal stenosis. Small central disc protrusion at C4-5 resulting in at most mild spinal stenosis. SOFT TISSUES: No prevertebral soft tissue swelling. Prominent atherosclerotic calcification of the carotid bifurcations. Multiple thyroid nodules measuring up to 1 cm with no follow up imaging required. IMPRESSION: 1. No acute intracranial abnormality or cervical spine fracture. 2. Cervical disc degeneration as above. Electronically signed by: Dasie Hamburg MD 01/06/2024 05:41 PM EDT RP Workstation: [id number]   CT Cervical Spine Wo Contrast Result Date: 01/06/2024 EXAM: CT HEAD AND CERVICAL SPINE 01/06/2024 05:07:11 PM TECHNIQUE: CT of the head and cervical spine was performed without the administration of intravenous contrast. Multiplanar reformatted images are provided for review. Automated exposure control, iterative reconstruction, and/or weight based adjustment of the mA/kV was utilized to reduce the radiation dose to as low as reasonably achievable. COMPARISON: CT head and cervical spine 06/12/2022. MRI head 06/25/2020. CLINICAL HISTORY: Fall. [employer]. FINDINGS: CT HEAD BRAIN AND VENTRICLES: Mild cerebral atrophy. Periventricular white matter hypodensities, similar to the prior CT and nonspecific but compatible with minimal chronic small vessel ischemic disease. No acute intracranial hemorrhage. No mass effect or midline shift.  No abnormal extra-axial fluid collection. Gray-white differentiation is maintained. No hydrocephalus. 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 SINUSES AND MASTOIDS: No acute abnormality. SOFT TISSUES AND SKULL: Calcified atherosclerosis of the skull base. No acute skull fracture. No acute soft tissue abnormality. CT CERVICAL SPINE BONES AND ALIGNMENT: Chronic straightening of the normal cervical lordosis. Grade 1 retrolisthesis of C6 on C7. No acute fracture or suspicious lesion. DEGENERATIVE CHANGES: Moderate disc space narrowing at C5-6 and severe narrowing at C6-7 with partial fusion between the vertebral bodies at the latter. Broad-based posterior disc osteophyte complex at C5-6 results in mild spinal stenosis. Small central disc protrusion at C4-5 resulting in at most mild spinal stenosis. SOFT TISSUES: No prevertebral soft tissue swelling. Prominent atherosclerotic calcification of the carotid bifurcations. Multiple thyroid nodules measuring up to 1 cm with no follow up imaging required. IMPRESSION: 1. No acute intracranial abnormality or cervical spine fracture. 2. Cervical disc degeneration as above. Electronically signed by: Dasie  Derrill MD 01/06/2024 05:41 PM EDT RP Workstation: [id number]   (Echo, Carotid, EGD, Colonoscopy, ERCP)    Subjective:   Discharge Exam: Vitals:   02/04/24 0826 02/04/24 1322  BP: (!) 170/59 (!) 156/65  Pulse: 70 77  Resp:  16  Temp:  (!) 97.4 F (36.3 C)  SpO2:  96%   Vitals:   02/04/24 0410 02/04/24 0806 02/04/24 0826 02/04/24 1322  BP: (!) 151/56 (!) 170/59 (!) 170/59 (!) 156/65  Pulse: 79 70 70 77  Resp: 16 18  16   Temp: 98.6 F (37 C) 98.6 F (37 C)  (!) 97.4 F (36.3 C)  TempSrc: Oral   Oral  SpO2: 94% 94%  96%  Weight:      Height:        General: Pt is alert,  awake, not in acute distress. Frail appearing Cardiovascular:  S1/S2 +, no rubs, no gallops Respiratory: CTA bilaterally, no wheezing, no rhonchi Abdominal: Soft, NT, ND, bowel sounds + Extremities: no edema, no cyanosis    The results of significant diagnostics from this hospitalization (including imaging, microbiology, ancillary and laboratory) are listed below for reference.     Microbiology: Recent Results (from the past 240 hours)  Urine Culture     Status: Abnormal   Collection Time: 01/31/24 12:06 PM   Specimen: Urine, Random  Result Value Ref Range Status   Specimen Description   Final    URINE, RANDOM Performed at [facility], [address] [place], [place] [address]    Special Requests   Final    NONE Reflexed from [id number] Performed at [name], [address] [place], [place] [address]    Culture 50,000 COLONIES/mL STREPTOCOCCUS MITIS/ORALIS (A)  Final   Report Status 02/03/2024 FINAL  Final   Organism ID, Bacteria STREPTOCOCCUS MITIS/ORALIS (A)  Final      Susceptibility   Streptococcus mitis/oralis - MIC*    PENICILLIN 2 INTERMEDIATE Intermediate     CEFTRIAXONE  4 RESISTANT Resistant     LEVOFLOXACIN >=16 RESISTANT Resistant     VANCOMYCIN  0.5 SENSITIVE Sensitive     * 50,000 COLONIES/mL STREPTOCOCCUS MITIS/ORALIS     Labs: BNP (last 3 results) Recent Labs    01/13/24 2259  BNP 1,301.5*   Basic Metabolic Panel: Recent Labs  Lab 01/31/24 1206 02/01/24 0535 02/02/24 0316 02/03/24 0329 02/04/24 0355  NA 139 138 136 132* 135  K 5.0 5.3* 4.2 4.8 4.1  CL 100 100 99 97* 96*  CO2 25 21* 28 25 28   GLUCOSE 119* 70 92 86 86  BUN 93* 87* 59* 65* 33*  CREATININE 5.47* 5.95* 4.62* 5.42* 3.75*  CALCIUM  8.6* 8.6* 8.4* 8.3* 9.2  PHOS  --  6.2*  --  5.2*  --    Liver Function Tests: Recent Labs  Lab 02/02/24 0316 02/03/24 0329 02/04/24 0355  AST 91* 68* 72*  ALT 131* 116* 121*  ALKPHOS 88 92 110  BILITOT 0.7 0.6 0.9  PROT  5.5* 5.6* 6.7  ALBUMIN  2.6* 2.6* 3.0*   No results for input(s): LIPASE, AMYLASE in the last 168 hours. No results for input(s): AMMONIA in the last 168 hours. CBC: Recent Labs  Lab 01/31/24 1206 02/01/24 0535 02/02/24 0316 02/03/24 0329 02/04/24 0355  WBC 7.2 5.1 6.5 6.0 6.9  NEUTROABS 5.3  --   --   --   --   HGB 10.3* 9.5* 8.9* 9.7* 11.1*  HCT 32.8* 30.9* 28.0* 30.3* 35.1*  MCV 102.8* 103.3* 102.2* 101.7*  102.6*  PLT 110* 86* 79* 82* 118*   Cardiac Enzymes: No results for input(s): CKTOTAL, CKMB, CKMBINDEX, TROPONINI in the last 168 hours. BNP: Invalid input(s): POCBNP CBG: Recent Labs  Lab 02/01/24 2201  GLUCAP 101*   D-Dimer No results for input(s): DDIMER in the last 72 hours. Hgb A1c No results for input(s): HGBA1C in the last 72 hours. Lipid Profile No results for input(s): CHOL, HDL, LDLCALC, TRIG, CHOLHDL, LDLDIRECT in the last 72 hours. Thyroid function studies No results for input(s): TSH, T4TOTAL, T3FREE, THYROIDAB in the last 72 hours.  Invalid input(s): FREET3 Anemia work up No results for input(s): VITAMINB12, FOLATE, FERRITIN, TIBC, IRON, RETICCTPCT in the last 72 hours. Urinalysis    Component Value Date/Time   COLORURINE YELLOW (A) 01/31/2024 1206   APPEARANCEUR CLEAR (A) 01/31/2024 1206   LABSPEC 1.014 01/31/2024 1206   PHURINE 6.0 01/31/2024 1206   GLUCOSEU NEGATIVE 01/31/2024 1206   HGBUR NEGATIVE 01/31/2024 1206   BILIRUBINUR NEGATIVE 01/31/2024 1206   KETONESUR NEGATIVE 01/31/2024 1206   PROTEINUR 100 (A) 01/31/2024 1206   NITRITE NEGATIVE 01/31/2024 1206   LEUKOCYTESUR SMALL (A) 01/31/2024 1206   Sepsis Labs Recent Labs  Lab 02/01/24 0535 02/02/24 0316 02/03/24 0329 02/04/24 0355  WBC 5.1 6.5 6.0 6.9   Microbiology Recent Results (from the past 240 hours)  Urine Culture     Status: Abnormal   Collection Time: 01/31/24 12:06 PM   Specimen: Urine, Random  Result Value  Ref Range Status   Specimen Description   Final    URINE, RANDOM Performed at [facility], [address] [place], [place] [address]    Special Requests   Final    NONE Reflexed from [phone] [id number] Performed at [facility], [address] [place], [place] [address]    Culture 50,000 COLONIES/mL STREPTOCOCCUS MITIS/ORALIS (A)  Final   Report Status 02/03/2024 FINAL  Final   Organism ID, Bacteria STREPTOCOCCUS MITIS/ORALIS (A)  Final      Susceptibility   Streptococcus mitis/oralis - MIC*    PENICILLIN 2 INTERMEDIATE Intermediate     CEFTRIAXONE  4 RESISTANT Resistant     LEVOFLOXACIN >=16 RESISTANT Resistant     VANCOMYCIN  0.5 SENSITIVE Sensitive     * 50,000 COLONIES/mL STREPTOCOCCUS MITIS/ORALIS     Time coordinating discharge: 38 minutes  SIGNED:   Anthony CHRISTELLA Pouch, MD  [facility] 02/04/2024, 1:24 PM Pager   If 7PM-7AM, please contact night-coverage [email or web address]

## 2024-02-04 NOTE — Progress Notes (Signed)
 Physical Therapy Treatment Patient Details Name: Katie [name] MRN: [id number] DOB: 05-14-1956 Today's Date: 02/04/2024   History of Present Illness 68 y/o female presented to ED on 01/31/24 from dialysis for decreased LOC and decreased respiratory rate. CT showed acute intraparenchymal hemorrhage over posterior R temporal lobe. No expansion noted with additional head CT. PMH: chronic diastolic heart failure, COPD, U7IF, ESRD on HD, HTN, severe aortic stenosis.    PT Comments  Pt in bed.  Stating she needs to use bathroom but is not allowed out of bed.  Reassured pt that she could move with staff and safety precautions.  She does agree with max encouragement.  Transitions to/from supine with rails and cga x 1.  Balance deficits in sitting which seem to be more anxiety/behavioral.  She has one post LOB which does require min a x 1 and cues to recover.  Stands to RW with min a x 1 but does have post lean with LE's braced on bed.  When asked to step in place, she stated she could not and promptly sat back down and tried to lay her head at the end of the bed.  When asked if she was returning to bed she stated yes but she still needed to have a BM.  BSC is brought to bedside where she stands and transfers with RW and min a x 1 and mod cues.  Large soft BM.  She does try to stand on her own and needs cues to wait for assist.  Care provided with max a before returning to bed with min a x 1 and promptly returns to supine.  She does state several times that she cannot breathe.  Pt on room air and sats/HR within limits.  She flatly refuses to sit in recliner despite encouragement.  Safety on and needs met.  Pt did state she would not go to SNF and wanted to return to [facility].  She reported she was walking to/from dining hall and around facility with RW prior to admission.  Pt is not at baseline of mobility.  If she is able to return she will need increased supports with +1 hands on for all mobility and  likely wheelchair for mobility in house until [facility] therapies can increase mobility and safety.  If they cannot accommodate those needs, SNF remains appropriate.     If plan is discharge home, recommend the following: A lot of help with walking and/or transfers;A little help with bathing/dressing/bathroom;Assistance with cooking/housework;Direct supervision/assist for medications management;Assist for transportation   Can travel by private vehicle        Equipment Recommendations  None recommended by PT    Recommendations for Other Services       Precautions / Restrictions Precautions Precautions: Fall Precaution/Restrictions Comments: blind L eye Restrictions Weight Bearing Restrictions Per Provider Order: No     Mobility  Bed Mobility Overal bed mobility: Needs Assistance Bed Mobility: Supine to Sit, Sit to Supine Rolling: Supervision, Used rails Sidelying to sit: Used rails Supine to sit: Supervision, Used rails Sit to supine: Supervision, Used rails     Patient Response: Impulsive  Transfers Overall transfer level: Needs assistance Equipment used: Rolling walker (2 wheels) Transfers: Sit to/from Stand, Bed to chair/wheelchair/BSC Sit to Stand: Min assist   Step pivot transfers: Min assist       General transfer comment: steps to [facility] with walker and min a x 1.  post lean with poor balance and +1 hands on at all times,.  Ambulation/Gait Ambulation/Gait assistance: Min assist Gait Distance (Feet): 3 Feet Assistive device: Rolling walker (2 wheels) Gait Pattern/deviations: Step-through pattern, Decreased step length - right, Decreased step length - left, Trunk flexed Gait velocity: decreased         Stairs             Wheelchair Mobility     Tilt Bed Tilt Bed Patient Response: Impulsive  Modified Rankin (Stroke Patients Only)       Balance Overall balance assessment: Needs assistance Sitting-balance support: Feet supported, No upper  extremity supported Sitting balance-Leahy Scale: Fair Sitting balance - Comments: post LOB which needed min a to recover Postural control: Posterior lean Standing balance support: Bilateral upper extremity supported, During functional activity, Reliant on assistive device for balance Standing balance-Leahy Scale: Poor Standing balance comment: +1 hands on at all times.                            Communication Communication Communication: No apparent difficulties  Cognition Arousal: Alert Behavior During Therapy: WFL for tasks assessed/performed   PT - Cognitive impairments: No family/caregiver present to determine baseline                       PT - Cognition Comments: inapropriate comments at times. Following commands: Intact      Cueing Cueing Techniques: Verbal cues, Tactile cues, Visual cues  Exercises      General Comments        Pertinent Vitals/Pain Pain Assessment Pain Assessment: Faces Faces Pain Scale: Hurts a little bit Pain Location: R knee and elbow Pain Descriptors / Indicators: Sore Pain Intervention(s): Limited activity within patient's tolerance, Repositioned    Home Living                          Prior Function            PT Goals (current goals can now be found in the care plan section) Progress towards PT goals: Not progressing toward goals - comment    Frequency    Min 2X/week      PT Plan      Co-evaluation              AM-PAC PT 6 Clicks Mobility   Outcome Measure  Help needed turning from your back to your side while in a flat bed without using bedrails?: A Little Help needed moving from lying on your back to sitting on the side of a flat bed without using bedrails?: A Little Help needed moving to and from a bed to a chair (including a wheelchair)?: A Lot Help needed standing up from a chair using your arms (e.g., wheelchair or bedside chair)?: A Little Help needed to walk in hospital  room?: A Lot Help needed climbing 3-5 steps with a railing? : Total 6 Click Score: 14    End of Session Equipment Utilized During Treatment: Gait belt Activity Tolerance: Patient tolerated treatment well;Patient limited by fatigue Patient left: in bed;with bed alarm set;with call bell/phone within reach Nurse Communication: Mobility status PT Visit Diagnosis: Unsteadiness on feet (R26.81);History of falling (Z91.81);Difficulty in walking, not elsewhere classified (R26.2);Muscle weakness (generalized) (M62.81);Pain Pain - Right/Left: Right Pain - part of body: Arm;Knee     Time: 9062-9048 PT Time Calculation (min) (ACUTE ONLY): 14 min  Charges:    $Therapeutic Activity: 8-22 mins PT General Charges $$ ACUTE PT  VISIT: 1 Visit                   Lauraine Gills, PTA 02/04/24, 10:07 AM

## 2024-02-04 NOTE — Progress Notes (Signed)
 Occupational Therapy Treatment Patient Details Name: Katie [name] MRN: [id number] DOB: 1955-11-26 Today's Date: 02/04/2024   History of present illness 68 y/o female presented to ED on 01/31/24 from dialysis for decreased LOC and decreased respiratory rate. CT showed acute intraparenchymal hemorrhage over posterior R temporal lobe. No expansion noted with additional head CT. PMH: chronic diastolic heart failure, COPD, U7IF, ESRD on HD, HTN, severe aortic stenosis.   OT comments  Pt seen for OT tx with NT. Pt required some encouragement but eventually agreeable to bathing/dressing ADL session. Pt declined to sit EOB for bathing and required encouragement for bed level participation requiring MIN-MOD A For UB bathing and dressing and MAX A for LB bathing. Pt educated in improving activity tolerance through ADL participation. Pt verbalized understanding. Continued to decline OOB at end of session. Will continue to progress as able.      If plan is discharge home, recommend the following:  A lot of help with walking and/or transfers;A lot of help with bathing/dressing/bathroom;Direct supervision/assist for medications management;Direct supervision/assist for financial management;Assist for transportation;Help with stairs or ramp for entrance;Supervision due to cognitive status;Assistance with cooking/housework   Equipment Recommendations  None recommended by OT    Recommendations for Other Services      Precautions / Restrictions Precautions Precautions: Fall Precaution/Restrictions Comments: blind L eye Restrictions Weight Bearing Restrictions Per Provider Order: No       Mobility Bed Mobility               General bed mobility comments: pt declined    Transfers                   General transfer comment: pt declined     Balance                                           ADL either performed or assessed with clinical judgement   ADL Overall  ADL's : Needs assistance/impaired         Upper Body Bathing: Bed level;Minimal assistance;Set up;Moderate assistance Upper Body Bathing Details (indicate cue type and reason): cues to participate Lower Body Bathing: Bed level;Maximal assistance Lower Body Bathing Details (indicate cue type and reason): pt declined to assist Upper Body Dressing : Bed level;Moderate assistance                          Extremity/Trunk Assessment              Vision       Perception     Praxis     Communication Communication Communication: No apparent difficulties   Cognition Arousal: Alert Behavior During Therapy: WFL for tasks assessed/performed Cognition: No family/caregiver present to determine baseline             OT - Cognition Comments: self-limiting at times, VC to redirect to task                 Following commands: Intact        Cueing   Cueing Techniques: Verbal cues, Tactile cues, Visual cues  Exercises Other Exercises Other Exercises: Pt educated in improving activity tolerance through ADL participation    Shoulder Instructions       General Comments      Pertinent Vitals/ Pain       Pain Assessment Pain Assessment: 0-10 Pain  Score: 6  Pain Location: R knee and elbow Pain Descriptors / Indicators: Sore Pain Intervention(s): Limited activity within patient's tolerance, Repositioned, RN gave pain meds during session  Home Living                                          Prior Functioning/Environment              Frequency  Min 2X/week        Progress Toward Goals  OT Goals(current goals can now be found in the care plan section)  Progress towards OT goals: Progressing toward goals  Acute Rehab OT Goals OT Goal Formulation: With patient Time For Goal Achievement: 02/16/24 Potential to Achieve Goals: Good  Plan      Co-evaluation                 AM-PAC OT 6 Clicks Daily Activity     Outcome  Measure   Help from another person eating meals?: A Little Help from another person taking care of personal grooming?: A Little Help from another person toileting, which includes using toliet, bedpan, or urinal?: A Lot Help from another person bathing (including washing, rinsing, drying)?: A Lot Help from another person to put on and taking off regular upper body clothing?: A Lot Help from another person to put on and taking off regular lower body clothing?: A Lot 6 Click Score: 14    End of Session    OT Visit Diagnosis: Unsteadiness on feet (R26.81);Other abnormalities of gait and mobility (R26.89);Repeated falls (R29.6);Muscle weakness (generalized) (M62.81);History of falling (Z91.81)   Activity Tolerance Patient tolerated treatment well   Patient Left in bed;with call bell/phone within reach;with bed alarm set   Nurse Communication          Time: 8951-8896 OT Time Calculation (min): 15 min  Charges: OT General Charges $OT Visit: 1 Visit OT Treatments $Self Care/Home Management : 8-22 mins  Warren SAUNDERS., MPH, MS, OTR/L ascom [phone] 02/04/24, 11:24 AM

## 2024-02-04 NOTE — Progress Notes (Signed)
 [facility]  ROUNDING NOTE   Subjective:   Patient seen resting in bed Alert and oriented Room air Denies pain or discomfort Request to discharge to ALF, refusing rehab  Objective:  Vital signs in last 24 hours:  Temp:  [98 F (36.7 C)-98.6 F (37 C)] 98.6 F (37 C) (09/23 0806) Pulse Rate:  [70-87] 70 (09/23 0826) Resp:  [16-19] 18 (09/23 0806) BP: (151-181)/(53-67) 170/59 (09/23 0826) SpO2:  [89 %-100 %] 94 % (09/23 0806)  Weight change:  Filed Weights   02/01/24 1028 02/03/24 0748 02/03/24 1138  Weight: 57.2 kg 55.7 kg 53.7 kg    Intake/Output: I/O last 3 completed shifts: In: -  Out: 2000 [Other:2000]   Intake/Output this shift:  Total I/O In: 240 [P.O.:240] Out: -   Physical Exam: General: NAD  Head: Normocephalic  Eyes: Anicteric  Lungs:  Diminished, room air  Heart: Regular  Abdomen:  Soft  Extremities:  No peripheral edema.  Neurologic: Alert and oriented.  Skin: Scattered rashes  Access: Left AVF    Basic Metabolic Panel: Recent Labs  Lab 01/31/24 1206 02/01/24 0535 02/02/24 0316 02/03/24 0329 02/04/24 0355  NA 139 138 136 132* 135  K 5.0 5.3* 4.2 4.8 4.1  CL 100 100 99 97* 96*  CO2 25 21* 28 25 28   GLUCOSE 119* 70 92 86 86  BUN 93* 87* 59* 65* 33*  CREATININE 5.47* 5.95* 4.62* 5.42* 3.75*  CALCIUM  8.6* 8.6* 8.4* 8.3* 9.2  PHOS  --  6.2*  --  5.2*  --     Liver Function Tests: Recent Labs  Lab 02/02/24 0316 02/03/24 0329 02/04/24 0355  AST 91* 68* 72*  ALT 131* 116* 121*  ALKPHOS 88 92 110  BILITOT 0.7 0.6 0.9  PROT 5.5* 5.6* 6.7  ALBUMIN  2.6* 2.6* 3.0*   No results for input(s): LIPASE, AMYLASE in the last 168 hours. No results for input(s): AMMONIA in the last 168 hours.  CBC: Recent Labs  Lab 01/31/24 1206 02/01/24 0535 02/02/24 0316 02/03/24 0329 02/04/24 0355  WBC 7.2 5.1 6.5 6.0 6.9  NEUTROABS 5.3  --   --   --   --   HGB 10.3* 9.5* 8.9* 9.7* 11.1*  HCT 32.8* 30.9* 28.0* 30.3* 35.1*   MCV 102.8* 103.3* 102.2* 101.7* 102.6*  PLT 110* 86* 79* 82* 118*    Cardiac Enzymes: No results for input(s): CKTOTAL, CKMB, CKMBINDEX, TROPONINI in the last 168 hours.  BNP: Invalid input(s): POCBNP  CBG: Recent Labs  Lab 02/01/24 2201  GLUCAP 101*    Microbiology: Results for orders placed or performed during the hospital encounter of 01/31/24  Urine Culture     Status: Abnormal   Collection Time: 01/31/24 12:06 PM   Specimen: Urine, Random  Result Value Ref Range Status   Specimen Description   Final    URINE, RANDOM Performed at [facility], [address], [place], [place] [address]    Special Requests   Final    NONE Reflexed from [id number] Performed at [facility], [address] [place], [place] [address]    Culture 50,000 COLONIES/mL STREPTOCOCCUS MITIS/ORALIS (A)  Final   Report Status 02/03/2024 FINAL  Final   Organism ID, Bacteria STREPTOCOCCUS MITIS/ORALIS (A)  Final      Susceptibility   Streptococcus mitis/oralis - MIC*    PENICILLIN 2 INTERMEDIATE Intermediate     CEFTRIAXONE  4 RESISTANT Resistant     LEVOFLOXACIN >=16 RESISTANT Resistant     VANCOMYCIN   0.5 SENSITIVE Sensitive     * 50,000 COLONIES/mL STREPTOCOCCUS MITIS/ORALIS    Coagulation Studies: No results for input(s): LABPROT, INR in the last 72 hours.  Urinalysis: No results for input(s): COLORURINE, LABSPEC, PHURINE, GLUCOSEU, HGBUR, BILIRUBINUR, KETONESUR, PROTEINUR, UROBILINOGEN, NITRITE, LEUKOCYTESUR in the last 72 hours.  Invalid input(s): APPERANCEUR     Imaging: No results found.    Medications:    promethazine  (PHENERGAN ) injection (IM or IVPB) 12.5 mg (02/03/24 1314)    (feeding supplement) PROSource Plus  30 mL Oral BID BM   bisacodyl   10 mg Oral QHS   calcitRIOL   0.25 mcg Oral Daily   DULoxetine   30 mg Oral Daily   epoetin  alfa-epbx (RETACRIT ) injection  4,000 Units Intravenous Q M,W,F-1800   famotidine    10 mg Oral QHS   fluticasone  furoate-vilanterol  1 puff Inhalation Daily   melatonin  5 mg Oral QHS   metoprolol  tartrate  12.5 mg Oral BID   multivitamin  1 tablet Oral QHS   pantoprazole   40 mg Oral Daily   polyethylene glycol  17 g Oral BID   scopolamine   1 patch Transdermal Q72H   sevelamer  carbonate  800 mg Oral TID WC   traZODone   75 mg Oral QHS   acetaminophen  **OR** acetaminophen , albuterol , guaiFENesin -dextromethorphan , HYDROmorphone  (DILAUDID ) injection, labetalol , midodrine , ondansetron  **OR** ondansetron  (ZOFRAN ) IV, oxyCODONE , promethazine  (PHENERGAN ) injection (IM or IVPB), topiramate   Assessment/ Plan:  [name] is a 68 y.o.  female with past medical history of ESRD with hemodialysis, diabetes mellitus, COPD, and hypertension. Patient presents to ED from outpatient dialysis for decreased level of conscience.  [facility]/MWF/Lt AVF   End stage renal disease on hemodialysis. Receiving dialysis yesterday, UF 2L achieved. Next treatment scheduled for Wednesday. Will continue to hold heparin  at outpatient clinic for 1 week.    2.  Decreased level of consciousness, intraparenchymal hematoma of the brain             She was found on dialysis with depressed respirations and decreased level of consciousness. In the ED CT imaging demonstrated a intraparenchymal hematoma.  Neurology and neurosurgery has been consulted. No surgical interventions determined at this time. Mentation has returned to baseline     3. Anemia of chronic kidney disease Hemoglobin & Hematocrit     Component Value Date/Time   HGB 11.1 (L) 02/04/2024 0355   HGB 11.7 08/06/2023 1545   HCT 35.1 (L) 02/04/2024 0355   HCT 35.1 08/06/2023 1545  Hgb stable Continue low dose Retacrit  4000 units with dialysis.     4. Secondary Hyperparathyroidism: with outpatient labs: PTH 507, phosphorus 3.9, calcium  8.5 on 12/23/23.  Bone minerals acceptable, continue sevelamer  with meals.    5.  Hypoalbuminemia Patient is on IDPN outpatient. Appreciate recs from dietician. Albumin  3.0.      LOS: 1 Katie [name] 9/23/202511:41 AM

## 2024-02-04 NOTE — TOC Transition Note (Addendum)
 Transition of Care [facility]) - Discharge Note   Patient Details  Name: [name] MRN: [id number] Date of Birth: 04-Sep-1955  Transition of Care [facility]) CM/SW Contact:  Dalia GORMAN Fuse, RN Phone Number: 02/04/2024, 1:58 PM   Clinical Narrative:    Patient is medically clear to discharge to [facility]. [facility] will transport. TOC spoke with the facility and they are in agreement with her discharge. Referral for [facility] PT/OT sent to [name] accepted the referral. DC Summary and FL2 emailed to [email or web address]   Final next level of care: Other (comment) Barriers to Discharge: Barriers Resolved   Patient Goals and CMS Choice            Discharge Placement                  Name of family member notified: ALF staff Katina Patient and family notified of of transfer: 02/04/24  Discharge Plan and Services Additional resources added to the After Visit Summary for                            [facility] Arranged: RN, PT, OT [facility] Agency: [facility] Date [name] Agency Contacted: 02/04/24 Time HH Agency Contacted: 1357 Representative spoke with at [facility] Agency: Rosella  Social Drivers of Health (SDOH) Interventions SDOH Screenings   Food Insecurity: No Food Insecurity (02/01/2024)  Housing: Low Risk  (02/01/2024)  Transportation Needs: No Transportation Needs (02/01/2024)  Utilities: Not At Risk (02/01/2024)  Social Connections: Socially Isolated (02/01/2024)  Tobacco Use: Medium Risk (01/31/2024)     Readmission Risk Interventions    01/16/2024   10:27 AM 06/13/2022    4:02 PM 09/27/2021    1:44 PM  Readmission Risk Prevention Plan  Transportation Screening  Complete   HRI or Home Care Consult   --  Palliative Care Screening   Not Applicable  Medication Review (RN Care Manager)  Complete   PCP or Specialist appointment within 3-5 days of discharge Complete    HRI or Home Care Consult  Complete   SW Recovery Care/Counseling Consult Complete     Palliative Care Screening Not Applicable Not Applicable   Skilled Nursing Facility Not Applicable Not Applicable

## 2024-02-06 ENCOUNTER — Ambulatory Visit: Admitting: Cardiovascular Disease

## 2024-02-06 NOTE — Progress Notes (Deleted)
 [facility] Consult Note  No chief complaint on file.  History of Present Illness: 68 yo female with history of CAD, chronic diastolic CHF, ESRD on HD (MWF), CVA, former tobacco abuse, COPD and severe aortic stenosis who is here today for follow up. Echo February 2025 with LVEF=60-65%. Severe aortic stenosis with mean gradient 36.5 mmHg, AVA 0.83 cm2, DI 0.31. Cardiac cath 08/15/23 with moderate mid LAD stenosis. Mean aortic valve gradient over 40 mmHg. Cardiac CT 11/12/23 with severely calcified AV (calcium  score 3688) and valve area suitable for a 23 mm Edwards Sapien 3 valve. CTA with favorable anatomy for transfemoral access for TAVR. She was see by Dr. Shyrl on 12/12/23 and TAVR was scheduled for 01/07/24. Her procedure was cancelled due to a hospitalization after a fall. She had another fall 01/13/24 after possible syncope. She was treated for pneumonia. She was seen in the ED again on 01/24/24 with weakness and nausea and again on 01/31/24 with confusion. Head CT with intracranial hemorrhage.   She tells me today that she has dyspnea with exertion, dizziness and chest pressure. She lives in [facility] in [place], [place]. She is disabled.  She has full dentures.   Primary Care Physician: Cordella Corning, FNP Primary Cardiologist: Budd Kindle Referring Cardiologist: Budd Kindle  Past Medical History:  Diagnosis Date   Chronic diastolic heart failure (HCC)    COPD (chronic obstructive pulmonary disease) (HCC)    Diabetes mellitus without complication (HCC)    ESRD on dialysis (HCC)    Hypertension    Severe aortic stenosis     Past Surgical History:  Procedure Laterality Date   A/V SHUNT INTERVENTION Left 12/21/2021   Procedure: A/V SHUNT INTERVENTION;  Surgeon: Marea Selinda RAMAN, MD;  Location: [facility];  Service: Cardiovascular;  Laterality: Left;   APPENDECTOMY     AV FISTULA PLACEMENT Left 03/16/2021   Procedure: INSERTION OF ARTERIOVENOUS (AV)  GORE-TEX GRAFT ARM;  Surgeon: Marea Selinda RAMAN, MD;  Location: [facility];  Service: Vascular;  Laterality: Left;   DIALYSIS/PERMA CATHETER INSERTION N/A 05/16/2020   Procedure: DIALYSIS/PERMA CATHETER INSERTION;  Surgeon: Marea Selinda RAMAN, MD;  Location: [facility];  Service: Cardiovascular;  Laterality: N/A;   DIALYSIS/PERMA CATHETER REMOVAL N/A 03/26/2022   Procedure: DIALYSIS/PERMA CATHETER REMOVAL;  Surgeon: Marea Selinda RAMAN, MD;  Location: [facility];  Service: Cardiovascular;  Laterality: N/A;   DIALYSIS/PERMA CATHETER REPAIR N/A 01/22/2022   Procedure: DIALYSIS/PERMA CATHETER INSERT;  Surgeon: Marea Selinda RAMAN, MD;  Location: [facility];  Service: Cardiovascular;  Laterality: N/A;   MANDIBLE SURGERY     RENAL BIOPSY Right    RIGHT HEART CATH N/A 05/10/2020   Procedure: RIGHT HEART CATH;  Surgeon: Mady Bruckner, MD;  Location: [facility];  Service: Cardiovascular;  Laterality: N/A;   RIGHT/LEFT HEART CATH AND CORONARY ANGIOGRAPHY Bilateral 08/15/2023   Procedure: RIGHT/LEFT HEART CATH AND CORONARY ANGIOGRAPHY;  Surgeon: Anner Alm ORN, MD;  Location: [facility];  Service: Cardiovascular;  Laterality: Bilateral;   TEMPORARY DIALYSIS CATHETER N/A 05/11/2020   Procedure: TEMPORARY DIALYSIS CATHETER;  Surgeon: Marea Selinda RAMAN, MD;  Location: [facility];  Service: Cardiovascular;  Laterality: N/A;    Current Outpatient Medications  Medication Sig Dispense Refill   acetaminophen  (TYLENOL ) 500 MG tablet Take 500 mg by mouth 3 (three) times daily. Take additional 500 mg every 6 hours as needed for pain     ADVAIR DISKUS 250-50  MCG/ACT AEPB Inhale 1 puff into the lungs 2 (two) times daily.     albuterol  (VENTOLIN  HFA) 108 (90 Base) MCG/ACT inhaler Inhale 1 puff into the lungs every 6 (six) hours as needed for wheezing.     aspirin  81 MG chewable tablet Chew 81 mg by mouth daily.     bisacodyl  (DULCOLAX) 5 MG EC tablet Take 2 tablets (10 mg total) by mouth  at bedtime.     calcitRIOL  (ROCALTROL ) 0.25 MCG capsule Take 1 capsule (0.25 mcg total) by mouth daily.     calcium  carbonate (TUMS - DOSED IN MG ELEMENTAL CALCIUM ) 500 MG chewable tablet Chew 1 tablet by mouth 3 (three) times daily as needed (GRED).     DULoxetine  (CYMBALTA ) 30 MG capsule Take 30 mg by mouth daily.     famotidine  (PEPCID ) 20 MG tablet Take 10 mg by mouth at bedtime.     guaiFENesin -dextromethorphan  (ROBITUSSIN DM) 100-10 MG/5ML syrup Take 10 mLs by mouth every 6 (six) hours as needed for cough.     loperamide  (IMODIUM  A-D) 2 MG tablet Take 2 mg by mouth. TAKE 1 TABLET BY MOUTH ONCE DAILY AS NEEDED FOR LOOSE STOOLS OR DIARRHEA     melatonin 3 MG TABS tablet Take 3 mg by mouth at bedtime.     metoprolol  tartrate (LOPRESSOR ) 25 MG tablet Take 1 tablet (25 mg total) by mouth 2 (two) times daily as needed (SBP >150).     midodrine  (PROAMATINE ) 5 MG tablet Take 5 mg by mouth. Monday, Wednesday, Friday prior to dialysis     ondansetron  (ZOFRAN ) 4 MG tablet Take 4 mg by mouth every Monday, Wednesday, and Friday.     pantoprazole  (PROTONIX ) 40 MG tablet Take 40 mg by mouth daily.     polyethylene glycol (MIRALAX  / GLYCOLAX ) 17 g packet Take 17 g by mouth 2 (two) times daily.     pramoxine (SARNA SENSITIVE) 1 % LOTN Apply 1 Application topically 2 (two) times daily.     tiZANidine  (ZANAFLEX ) 2 MG tablet Take 2 mg by mouth at bedtime.     topiramate  (TOPAMAX ) 50 MG tablet Take 1 tablet (50 mg total) by mouth 2 (two) times daily as needed (headache).     traZODone  (DESYREL ) 50 MG tablet Take 75 mg by mouth at bedtime.     No current facility-administered medications for this visit.    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  Tobacco Use   Smoking status: Former    Types: Cigarettes   Smokeless tobacco: Never   Tobacco comments:    Quit over 15 years ago  Vaping Use   Vaping status: Never Used  Substance and Sexual Activity   Alcohol  use: Not Currently   Drug use: Never   Sexual activity: Not on file  Other Topics Concern   Not on file  Social History Narrative   Not on file   Social Drivers of Health   Financial Resource Strain: Not on file  Food Insecurity: No Food Insecurity (02/01/2024)   Hunger Vital Sign  Worried About Programme researcher, broadcasting/film/video in the Last Year: Never true    Ran Out of Food in the Last Year: Never true  Transportation Needs: No Transportation Needs (02/01/2024)   PRAPARE - Administrator, Civil Service (Medical): No    Lack of Transportation (Non-Medical): No  Physical Activity: Not on file  Stress: Not on file  Social Connections: Socially Isolated (02/01/2024)   Social Connection and Isolation Panel    Frequency of Communication with Friends and Family: More than three times a week    Frequency of Social Gatherings with Friends and Family: Patient declined    Attends Religious Services: Patient declined    Database administrator or Organizations: No    Attends Banker Meetings: Never    Marital Status: Never married  Intimate Partner Violence: Not At Risk (02/01/2024)   Humiliation, Afraid, Rape, and Kick questionnaire    Fear of Current or Ex-Partner: No    Emotionally Abused: No    Physically Abused: No    Sexually Abused: No    Family History  Problem Relation Age of Onset   Heart attack Mother    Asthma Mother    Uterine cancer Mother    Skin cancer Father    Prostate cancer Father    CAD Father    Parkinson's disease Father    Lung cancer Brother     Review of Systems:  As stated in the HPI and otherwise  negative.   There were no vitals taken for this visit.  Physical Examination: General: Well developed, well nourished, NAD  HEENT: OP clear, mucus membranes moist  SKIN: warm, dry. No rashes. Neuro: No focal deficits  Musculoskeletal: Muscle strength 5/5 all ext  Psychiatric: Mood and affect normal  Neck: No JVD, no carotid bruits, no thyromegaly, no lymphadenopathy.  Lungs:Clear bilaterally, no wheezes, rhonci, crackles Cardiovascular: Regular rate and rhythm.  Loud, harsh, late peaking systolic murmur.  Abdomen:Soft. Bowel sounds present. Non-tender.  Extremities:  No lower extremity edema. Pulses are 2 + in the bilateral DP/PT.  EKG:  EKG is ordered today. The ekg ordered today demonstrates      Echo February 2025:  1. Left ventricular ejection fraction, by estimation, is 60 to 65%. The  left ventricle has normal function. The left ventricle has no regional  wall motion abnormalities. There is moderate left ventricular hypertrophy.  Left ventricular diastolic  parameters are consistent with Grade I diastolic dysfunction (impaired  relaxation).   2. Right ventricular systolic function is normal. The right ventricular  size is normal. There is mildly elevated pulmonary artery systolic  pressure. The estimated right ventricular systolic pressure is 40.5 mmHg.   3. Left atrial size was mildly dilated.   4. The mitral valve is normal in structure. No evidence of mitral valve  regurgitation. No evidence of mitral stenosis. The mean mitral valve  gradient is 4.0 mmHg. Moderate mitral annular calcification.   5. The aortic valve is calcified. There is severe calcifcation of the  aortic valve. Aortic valve regurgitation is mild. Severe aortic valve  stenosis. Aortic valve area, by VTI measures 0.87 cm. Aortic valve mean  gradient measures 36.5 mmHg. Aortic  valve Vmax measures 4.75 m/s.   6. There is borderline dilatation of the ascending aorta, measuring 39  mm.   7. The  inferior vena cava is normal in size with greater than 50%  respiratory variability, suggesting right atrial pressure of 3 mmHg.   Comparison(s): Previous  AV meas, peak PG, 29 mmHg mean PG.   FINDINGS   Left Ventricle: Left ventricular ejection fraction, by estimation, is 60  to 65%. The left ventricle has normal function. The left ventricle has no  regional wall motion abnormalities. The left ventricular internal cavity  size was normal in size. There is   moderate left ventricular hypertrophy. Left ventricular diastolic  parameters are consistent with Grade I diastolic dysfunction (impaired  relaxation).   Right Ventricle: The right ventricular size is normal. No increase in  right ventricular wall thickness. Right ventricular systolic function is  normal. There is mildly elevated pulmonary artery systolic pressure. The  tricuspid regurgitant velocity is 2.98   m/s, and with an assumed right atrial pressure of 5 mmHg, the estimated  right ventricular systolic pressure is 40.5 mmHg.   Left Atrium: Left atrial size was mildly dilated.   Right Atrium: Right atrial size was normal in size.   Pericardium: There is no evidence of pericardial effusion.   Mitral Valve: The mitral valve is normal in structure. There is mild  calcification of the mitral valve leaflet(s). Moderate mitral annular  calcification. No evidence of mitral valve regurgitation. No evidence of  mitral valve stenosis. MV peak gradient,  10.9 mmHg. The mean mitral valve gradient is 4.0 mmHg.   Tricuspid Valve: The tricuspid valve is normal in structure. Tricuspid  valve regurgitation is mild . No evidence of tricuspid stenosis.   Aortic Valve: The aortic valve is calcified. There is severe calcifcation  of the aortic valve. Aortic valve regurgitation is mild. Severe aortic  stenosis is present. Aortic valve mean gradient measures 36.5 mmHg. Aortic  valve peak gradient measures 90.2  mmHg. Aortic valve  area, by VTI measures 0.87 cm.   Pulmonic Valve: The pulmonic valve was normal in structure. Pulmonic valve  regurgitation is not visualized. No evidence of pulmonic stenosis.   Aorta: The aortic root is normal in size and structure. There is  borderline dilatation of the ascending aorta, measuring 39 mm.   Venous: The inferior vena cava is normal in size with greater than 50%  respiratory variability, suggesting right atrial pressure of 3 mmHg.   IAS/Shunts: No atrial level shunt detected by color flow Doppler.     LEFT VENTRICLE  PLAX 2D  LVIDd:         4.20 cm      Diastology  LVIDs:         2.55 cm      LV e' medial:    5.00 cm/s  LV PW:         0.80 cm      LV E/e' medial:  24.0  LV IVS:        1.70 cm      LV e' lateral:   5.66 cm/s  LVOT diam:     1.90 cm      LV E/e' lateral: 21.2  LV SV:         83  LV SV Index:   49  LVOT Area:     2.84 cm    LV Volumes (MOD)  LV vol d, MOD A4C: 114.0 ml  LV vol s, MOD A4C: 30.6 ml  LV SV MOD A4C:     114.0 ml   RIGHT VENTRICLE             IVC  RV Basal diam:  3.30 cm     IVC diam: 1.00 cm  RV S prime:  12.50 cm/s  TAPSE (M-mode): 2.5 cm   LEFT ATRIUM             Index        RIGHT ATRIUM           Index  LA diam:        3.30 cm 1.96 cm/m   RA Area:     20.90 cm  LA Vol (A2C):   62.4 ml 37.02 ml/m  RA Volume:   63.80 ml  37.85 ml/m  LA Vol (A4C):   78.0 ml 46.27 ml/m  LA Biplane Vol: 70.7 ml 41.94 ml/m   AORTIC VALVE                     PULMONIC VALVE  AV Area (Vmax):    0.83 cm      PV Vmax:       0.88 m/s  AV Area (Vmean):   0.92 cm      PV Peak grad:  3.1 mmHg  AV Area (VTI):     0.87 cm  AV Vmax:           475.00 cm/s  AV Vmean:          298.000 cm/s  AV VTI:            0.951 m  AV Peak Grad:      90.2 mmHg  AV Mean Grad:      36.5 mmHg  LVOT Vmax:         139.50 cm/s  LVOT Vmean:        96.325 cm/s  LVOT VTI:          0.292 m  LVOT/AV VTI ratio: 0.31    AORTA  Ao Root diam: 3.20 cm  Ao Asc diam:   3.90 cm  Ao Arch diam: 2.5 cm   MITRAL VALVE                TRICUSPID VALVE  MV Area (PHT): 3.17 cm     TR Peak grad:   35.5 mmHg  MV Area VTI:   1.59 cm     TR Vmax:        298.00 cm/s  MV Peak grad:  10.9 mmHg  MV Mean grad:  4.0 mmHg     SHUNTS  MV Vmax:       1.65 m/s     Systemic VTI:  0.29 m  MV Vmean:      96.3 cm/s    Systemic Diam: 1.90 cm  MV Decel Time: 239 msec  MV E velocity: 120.00 cm/s  MV A velocity: 174.00 cm/s  MV E/A ratio:  0.69   Cardiac cath 08/15/23:    Very Small Caliber Diagonal Branches: 1st Diag lesion is 90% stenosed. 2nd Diag lesion is 80% stenosed.   Mid LAD lesion is 55% stenosed.   LV end diastolic pressure is normal.   There is severe aortic valve stenosis.   Anticipated discharge date to be determined.   Plan per discussion with primary cardiology team and nephrologist would be to have the patient be admitted under the hospitalist service.  She will need to have dialysis done while she is here and have titration of medications.   Recommend Aspirin  81mg  daily for moderate CAD.   Dominance: Codominant  POST-OPERATIVE DIAGNOSIS:   Severe heavily calcified aortic stenosis with mean gradient by cardiac catheterization of 40 to 50 mmHg (peak to peak gradient 59 mmHg) Single-vessel disease  with a 60% LAD stenosis just after tandem very small caliber D1 and D2 (ostium of D1-D2 a roughly 90 and 80%); large codominant LCx with relatively no significant disease; moderate caliber codominant RCA with minimal disease in the PDA. RHC hemodynamics: RAP 1 mmHg, RV P-EDP 44/1-2 mmHg; PAP-mean 44/12-24 mmHg, PCWP mean 5 mmHg.;  Fick Cardiac Output-Index 6.41-3.87 with ao sat 93% and PA sat 70%. LV P-EDP 191/1-23 mmHg; AO P-MAP 140/61-95 mmHg Aortic AoV gradient P-P59 0.5 mmHg; mean AoV gradient estimated 45 to 50 mmHg     PT TO BE ADMITTED PER NEPHROLOGIST, DR LATEEF    Plan per discussion with primary cardiology team and nephrologist would be to have the patient be  admitted under the hospitalist service.  She will need to have dialysis done while she is here and have titration of medications.   Recommend Aspirin  81mg  daily for moderate CAD.   Will need outpatient structural heart team       Alm Clay, MD       Recommendations  Antiplatelet/Anticoag Recommend Aspirin  81mg  daily for moderate CAD.  Discharge Date Anticipated discharge date to be determined. Plan per discussion with primary cardiology team and nephrologist would be to have the patient be admitted under the hospitalist service.  She will need to have dialysis done while she is here and have titration of medications.   Surgeon Notes    08/15/2023  1:30 PM Brief Op Note signed by Clay Alm ORN, MD   Indications  Coronary artery calcification seen on computed tomography [I25.10 (ICD-10-CM)]  Aortic valve stenosis, etiology of cardiac valve disease unspecified [I35.0 (ICD-10-CM)]   Clinical Presentation  CHF/Shock Congestive heart failure not present. No shock present.   Procedural Details  Technical Details PCP: [facility] Cardiologist:  Redell Cave, MD; Cadence Franchester, [place]    PATIENT:  Katie [name]  68 y.o. female former (30 years) with a history of three-vessel coronary calcification noted on CT scan, HFpEF, ESRD on HD M-W-F with prior CVA with recently diagnosed severe aortic stenosis who is referred for right and left heart catheterization as part of preoperative evaluation for likely TAVR.  She has noted combination of exertional dyspnea with intermittent chest pains.  Has had issues with hypotension during dialysis.  She was seen by Mikey Franchester, PA PA on 08/08/2023 to follow-up after echocardiogram and scheduled procedure.   PRE-OPERATIVE DIAGNOSIS: Severe Aortic Stenosis; Coronary Artery Calcification on CT  PROCEDURE PERFORMED: Time Out: Verified patient identification, verified procedure, site/side was marked, verified  correct patient position, special equipment/implants available, medications/allergies/relevent history reviewed, required imaging and test results available. Performed.   Access:  RIGHT Radial Artery: 6 Fr sheath -- Seldinger technique using glide sheath micropuncture kit   Direct ultrasound guidance used.  Permanent image obtained and placed on chart.  10 mL radial cocktail IA; 3000 units IV Heparin  RIGHT Brachial Vein: 5 French sheath-modified Seldinger technique-glide sheath micropuncture kit             Direct ultrasound guidance used.  Permanent image obtained and placed on chart.   Right Heart Catheterization: 5 Fr Norva Purl catheter advanced under fluoroscopy with balloon inflated to the RA, RV, then PCWP-PA for hemodynamic measurement. *Simultaneous FA & PA blood gases checked for SaO2% to calculate FICK CO/CI. Catheter removed completely out of the body with balloon deflated.    Left Heart Catheterization: 5Fr catheters advanced or exchanged over a J-wire under direct fluoroscopic guidance into the ascending aorta;  TIG 4.0 catheter advanced first.   * LV Hemodynamics (no LV Gram) & Left Coronary Artery Cineangiography: TIG 4.0 catheter; the valve was crossed using the standard J-wire with minimal difficulty, however gradient was significant.  * Right Coronary Artery Cineangiography: JR4 catheter    Upon completion of Angiogaphy, the catheter was removed completely out of the body over a wire, without complication.   Brachial Sheath(s) removed in the Cath Lab with manual pressure for hemostasis.    Radial sheath removed in the Cardiac Catheterization lab with TR Band placed for hemostasis.   TR Band: 1255 Hours; 12 mL air reverse Barbeau B   MEDICATIONS * SQ Lidocaine  3mL * Radial Cocktail: 3 mg Verapmil in 10 mL NS * Isovue Contrast: 50 mL * Heparin : 3000 units   ANESTHESIA:   local and IV sedation; 3 mL lidocaine  for both brachial and arterial access; 0.5 mg IV Versed ,  25 mcg IV fentanyl  Sublimaze  for sedation   EBL: <20 mL   PATIENT DISPOSITION:  PACU - hemodynamically stable.           Estimated blood loss <50 mL.   During this procedure medications were administered to achieve and maintain moderate conscious sedation while the patient's heart rate, blood pressure, and oxygen saturation were continuously monitored and I was present face-to-face 100% of this time. Maryann McDaniel Cardiovascular Specialist and Katlyn Kiger Cardiovascular Specialist are independent, trained observers who assisted in the monitoring of the patient's level of consciousness.   Medications (Filter: Administrations occurring from 1201 to 1306 on 08/15/23) Heparin  (Porcine) in NaCl 2000-0.9 UNIT/L-% SOLN (mL)  Total volume: 1,000 mL Date/Time Rate/Dose/Volume Action   08/15/23 1202 1,000 mL Given   fentaNYL  (SUBLIMAZE ) injection (mcg)  Total dose: 25 mcg Date/Time Rate/Dose/Volume Action   08/15/23 1207 25 mcg Given   midazolam  (VERSED ) injection (mg)  Total dose: 0.5 mg Date/Time Rate/Dose/Volume Action   08/15/23 1208 0.5 mg Given   lidocaine  (PF) (XYLOCAINE ) 1 % injection (mL)  Total volume: 5 mL Date/Time Rate/Dose/Volume Action   08/15/23 1213 5 mL Given   verapamil  (ISOPTIN ) injection (mg)  Total dose: 2.5 mg Date/Time Rate/Dose/Volume Action   08/15/23 1222 2.5 mg Given   heparin  sodium (porcine) injection (Units)  Total dose: 3,000 Units Date/Time Rate/Dose/Volume Action   08/15/23 1231 3,000 Units Given   iohexol  (OMNIPAQUE ) 300 MG/ML solution (mL)  Total volume: 82 mL Date/Time Rate/Dose/Volume Action   08/15/23 1248 82 mL Given    Sedation Time  Sedation Time Physician-1: 42 minutes 43 seconds Contrast     Administrations occurring from 1201 to 1306 on 08/15/23:  Medication Name Total Dose  iohexol  (OMNIPAQUE ) 300 MG/ML solution 82 mL   Radiation/Fluoro  Fluoro time: 7.1 (min) DAP: 15.7 (Gycm2) Cumulative Air Kerma: 228  (mGy) Complications  Complications documented before study signed (08/15/2023  2:31 PM)   No complications were associated with this study.  Documented by Kiger, Katlyn, RT - 08/15/2023 12:49 PM     Coronary Findings  Diagnostic Dominance: Right Left Anterior Descending  Vessel is large. The vessel exhibits minimal luminal irregularities. The vessel is moderately calcified. Proximal one third to half of vessel  Mid LAD lesion is 55% stenosed. The lesion is distal to major branch, focal and concentric.    First Diagonal Branch  Vessel is small in size. There is moderate disease in the vessel.  1st Diag lesion is 90% stenosed.    Second Diagonal Branch  Vessel is small in size. There is mild  disease in the vessel.  2nd Diag lesion is 80% stenosed.    Left Circumflex  Vessel was injected. Vessel is large and very large. Vessel is angiographically normal. The vessel originates from a separate ostium. Essentially cloacal left main The main circumflex terminates as a large lateral bifurcating OM 2    First Obtuse Marginal Branch  Vessel is small in size.    Third Obtuse Marginal Branch  Vessel is small in size.    First Left Posterolateral Branch  Vessel is small in size.    Left Posterior Atrioventricular Artery  Vessel is small in size.    Right Coronary Artery  Vessel was injected. Vessel is normal in caliber. Vessel is angiographically normal.    Right Posterior Descending Artery  Vessel is small in size. There is mild disease in the vessel.    First Right Posterolateral Branch  Vessel is small in size.    Intervention   No interventions have been documented.   Right Heart  Right Heart Pressures PAP-mean 44/12-24 mmHg, PCWP mean 5 mmHg. LV EDP is normal. LV P-EDP 191/1-23 mmHg; AO P-MAP 140/61-95 mmHg Ao sat 93% and PA sat 70%; Fick Cardiac Output-Index 6.41-3.87 :.  Right Atrium Right atrial pressure is decreased. RAP 1 mmHg  Right Ventricle RV P-EDP 44/1-2 mmHg    Wall Motion  No LV gram         Left Heart  Aortic Valve There is severe aortic valve stenosis. Aortic AoV gradient P-P59 0.5 mmHg; mean AoV gradient estimated 45 to 50 mmHg The aortic valve is calcified.   Coronary Diagrams  Diagnostic Dominance: Right  Intervention   Implants   No implant documentation for this case.   Syngo Images   Show images for CARDIAC CATHETERIZATION Images on Long Term Storage   Show images for Dessie, Tatem to Procedure Log  Procedure Log    Hemodynamics  Pressures Phases Resting  Right     RA Mean  mmHg 1    RA A-Wave  mmHg 1    RV  mmHg 44/0  Pulmonary     PA  mmHg 53/48 (52)    PCW Mean  mmHg 4.0    PCW A-Wave  mmHg 8.0    PCW V-Wave  mmHg 5.0    PAPi   5.0    Hemo Data  Flowsheet Row Most Recent Value  Fick Cardiac Output 6.41 L/min  Fick Cardiac Output Index 3.87 (L/min)/BSA  Aortic Mean Gradient 49.96 mmHg  Aortic Peak Gradient 59.5 mmHg  Aortic Valve Area 0.79  Aortic Value Area Index 0.48 cm2/BSA  RA A Wave 1 mmHg  RA V Wave -1 mmHg  RA Mean -3 mmHg  RV Systolic Pressure 42 mmHg  RV Diastolic Pressure -10 mmHg  RV EDP -1 mmHg  PA Systolic Pressure 53 mmHg  PA Diastolic Pressure 48 mmHg  PA Mean 52 mmHg  PW A Wave 8 mmHg  PW V Wave 5 mmHg  PW Mean 4 mmHg  AO Systolic Pressure 128 mmHg  AO Diastolic Pressure 44 mmHg  AO Mean 79 mmHg  LV Systolic Pressure 197 mmHg  LV Diastolic Pressure -13 mmHg  LV EDP 6 mmHg  Arterial Occlusion Pressure Extended Systolic Pressure 145 mmHg  Arterial Occlusion Pressure Extended Diastolic Pressure 61 mmHg  Arterial Occlusion Pressure Extended Mean Pressure 95 mmHg  Left Ventricular Apex Extended Systolic Pressure 190 mmHg  LVp Diastolic Pressure 1 mmHg  Left Ventricular Apex Extended EDP Pressure 23 mmHg  QP/QS 1  TPVR Index 6.21 HRUI  TSVR Index 20.44 HRUI  TPVR/TSVR Ratio 0.3    Recent Labs: 01/13/2024: B Natriuretic Peptide 1,301.5; TSH 1.285 01/16/2024:  Magnesium  2.1 02/04/2024: ALT 121; BUN 33; Creatinine, Ser 3.75; Hemoglobin 11.1; Platelets 118; Potassium 4.1; Sodium 135    Wt Readings from Last 3 Encounters:  02/03/24 118 lb 6.2 oz (53.7 kg)  01/24/24 119 lb 4.3 oz (54.1 kg)  01/16/24 119 lb 4.3 oz (54.1 kg)    Assessment and Plan:   1. Severe Aortic Valve Stenosis: She has severe, stage D aortic valve stenosis. She has NYHA class 3 symptoms. I have personally reviewed the echo images. The aortic valve is thickened and calcified with limited leaflet mobility. I think she would benefit from AVR. Given advanced age, COPD and ESRD, she is not a good candidate for conventional AVR by surgical approach. I think she may be a good candidate for TAVR.   I have reviewed the natural history of aortic stenosis with the patient and their family members  who are present today. We have discussed the limitations of medical therapy and the poor prognosis associated with symptomatic aortic stenosis. We have reviewed potential treatment options, including palliative medical therapy, conventional surgical aortic valve replacement, and transcatheter aortic valve replacement. We discussed treatment options in the context of the patient's specific comorbid medical conditions.   She would like to proceed with planning for TAVR. Risks and benefits of the valve procedure are reviewed with the patient. I will arrange a cardiac CT, CTA of the chest/abdomen and pelvis and she will then be referred to see one of the CT surgeons on our TAVR team.     Labs/ tests ordered today include:   No orders of the defined types were placed in this encounter.  Disposition:   F/U will be arranged with the structural team  Signed, Lonni Cash, MD, [facility] 02/06/2024 6:35 AM    [facility] [address] [place], [place], [place]  [address] Phone: [phone]; Fax: [phone]

## 2024-02-08 ENCOUNTER — Emergency Department
Admission: EM | Admit: 2024-02-08 | Discharge: 2024-02-08 | Disposition: A | Discharge status: Home / Self Care | Attending: Emergency Medicine | Admitting: Emergency Medicine

## 2024-02-08 DIAGNOSIS — T82590A Other mechanical complication of surgically created arteriovenous fistula, initial encounter: Secondary | ICD-10-CM | POA: Diagnosis present

## 2024-02-08 DIAGNOSIS — T829XXA Unspecified complication of cardiac and vascular prosthetic device, implant and graft, initial encounter: Secondary | ICD-10-CM

## 2024-02-08 DIAGNOSIS — Z992 Dependence on renal dialysis: Secondary | ICD-10-CM | POA: Diagnosis not present

## 2024-02-08 DIAGNOSIS — N186 End stage renal disease: Secondary | ICD-10-CM | POA: Diagnosis not present

## 2024-02-08 DIAGNOSIS — E1122 Type 2 diabetes mellitus with diabetic chronic kidney disease: Secondary | ICD-10-CM | POA: Diagnosis not present

## 2024-02-08 NOTE — ED Notes (Signed)
 This EDT came into this room to answer call light, pt is agitated that her lunch tray has not came and stated that she really needs to go pee. This EDT, placed pt on bed pan, peri care provided and placed a new brief. Pt is currently resting in bed at this time, call light within reach.

## 2024-02-08 NOTE — ED Triage Notes (Addendum)
 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  Pt reports having treatment yesterday. Non pressure dressing noted to be saturated.  Dressing reinforced by First Nurse.   Pt reports not feeling well from previous fall.

## 2024-02-08 NOTE — ED Notes (Signed)
 [facility] was called to transport [facility] back to [facility] per [place] with [facility] said it would be 10 pm tonight

## 2024-02-08 NOTE — Discharge Instructions (Signed)
 Please keep the Tegaderm dressing in place for the next 24 hours.  After which it may be removed.  Return to the emergency department for any further bleeding or any other symptom per se concerning to yourself or staff members.

## 2024-02-08 NOTE — ED Provider Notes (Signed)
   [facility] Provider Note    Event Date/Time   First MD Initiated Contact with Patient 02/08/24 1412     (approximate)  History   Chief Complaint: Vascular Access Problem  HPI  Katie [name] is a 68 y.o. female with a past medical history of ESRD on HD, diabetes, presents emergency department for bleeding from her dialysis access.  Patient had dialysis yesterday, at her nursing facility today they noted bleeding from her fistula access site so the patient was brought to the emergency department for evaluation.  Here the patient is awake alert no distress.  Has no complaints.  Physical Exam   Triage Vital Signs: ED Triage Vitals [02/08/24 1300]  Encounter Vitals Group     BP (!) 164/78     Girls Systolic BP Percentile      Girls Diastolic BP Percentile      Boys Systolic BP Percentile      Boys Diastolic BP Percentile      Pulse Rate 83     Resp 16     Temp 98 F (36.7 C)     Temp Source Oral     SpO2 93 %     Weight 118 lb (53.5 kg)     Height 5' 6 (1.676 m)     Head Circumference      Peak Flow      Pain Score 0     Pain Loc      Pain Education      Exclude from Growth Chart     Most recent vital signs: Vitals:   02/08/24 1300  BP: (!) 164/78  Pulse: 83  Resp: 16  Temp: 98 F (36.7 C)  SpO2: 93%    General: Awake, no distress.  CV:  Good peripheral perfusion.   Resp:  Normal effort.   Abd:  No distention.  Other:  Fistula dressing removed the dressing was saturated but the fistula is not currently bleeding   ED Results / Procedures / Treatments   MEDICATIONS ORDERED IN ED: Medications - No data to display   IMPRESSION / MDM / ASSESSMENT AND PLAN / ED COURSE  I reviewed the triage vital signs and the nursing notes.  Patient's presentation is most consistent with acute illness / injury with system symptoms.  Patient presents to the emergency department for bleeding left upper extremity fistula.  Patient's 4 x 4 pad was  saturated however upon removing the pad there was only a small dollop of new bleeding/blood.  I was able to cover the access site with a small amount of Dermabond after drying I covered this with a Tegaderm.  No further bleeding.  Will monitor in the emergency department for 30 minutes to an hour to ensure no further bleeding.  As long as the patient continues to appear well I believe the patient will be safe for outpatient follow-up.  Patient agreeable to plan.  Patient's access area has remained hemostatic since I have placed a Tegaderm.  We will discharge the patient home.  Discussed return precautions for any additional bleeding.  FINAL CLINICAL IMPRESSION(S) / ED DIAGNOSES   Bleeding fistula   Note:  This document was prepared using Dragon voice recognition software and may include unintentional dictation errors.   Dorothyann Drivers, MD 02/08/24 1510

## 2024-02-08 NOTE — ED Notes (Signed)
 Pt moved from recliner to bed. Pt endorses not being able to walk. Pt sufferers with severe back pain.

## 2024-02-08 NOTE — ED Notes (Signed)
 Spoke with [facility] at this time in regards to pt transport. Per [facility] they are able to pick pt up but it will be later tonight around 2200.

## 2024-02-08 NOTE — ED Notes (Addendum)
 First nurse note: From [facility] for dialysis port issue. Had dialysis yesterday, site oozing blood slowly since yesterday. No thinners. L hand swollen also.   EMS VS: HR 88, 96% RA, 177/78 Alert, oriented. Complains of HA, seen here recently after fall and stable intraparenchymal hematoma that needs rpt CT 2 weeks after last.

## 2024-02-10 ENCOUNTER — Ambulatory Visit: Attending: Cardiovascular Disease | Admitting: Cardiovascular Disease

## 2024-02-10 ENCOUNTER — Encounter: Payer: Self-pay | Admitting: Cardiovascular Disease

## 2024-02-10 VITALS — BP 144/58 | HR 73 | Ht 60.0 in | Wt 119.0 lb

## 2024-02-10 DIAGNOSIS — I350 Nonrheumatic aortic (valve) stenosis: Secondary | ICD-10-CM | POA: Insufficient documentation

## 2024-02-10 NOTE — Telephone Encounter (Signed)
 Left message for [facility] to call our office back to schedule an appointment.

## 2024-02-10 NOTE — Progress Notes (Signed)
 [facility] Consult Note  Chief Complaint  Patient presents with   Follow-up    Severe aortic stenosis   History of Present Illness: 68 yo female with history of CAD, chronic diastolic CHF, ESRD on HD (MWF), CVA, former tobacco abuse, COPD and severe aortic stenosis who is here today for follow up. Echo February 2025 with LVEF=60-65%. Severe aortic stenosis with mean gradient 36.5 mmHg, AVA 0.83 cm2, DI 0.31. Cardiac cath 08/15/23 with moderate mid LAD stenosis. Mean aortic valve gradient over 40 mmHg. Cardiac CT 11/12/23 with severely calcified AV (calcium  score 3688) and valve area suitable for a 23 mm Edwards Sapien 3 valve. CTA with favorable anatomy for transfemoral access for TAVR. She was see by Dr. Shyrl on 12/12/23 and TAVR was scheduled for 01/07/24. Her procedure was cancelled due to a hospitalization after a fall. She had another fall 01/13/24 after possible syncope. She was treated for pneumonia. Echo 01/13/24 with LVEF=60-65%. Moderate mitral stenosis. Severe aortic stenosis with mean gradient 47 mmHg, AVA 0.7 cm2, SVI 37, DI 0.28. She was seen in the ED again on 01/24/24 with weakness and nausea and again on 01/31/24 with confusion. Head CT with intracranial hemorrhage. She missed her appt here on 02/06/24.   She is here today for follow up. She continues to have dyspnea with exertion but she is very inactive. She has no chest pain. No dizziness. She lives in [facility] in [place], [place]. She is disabled. She has full dentures.   Primary Care Physician: Cordella Corning, FNP Primary Cardiologist: Budd Kindle Referring Cardiologist: Budd Kindle  Past Medical History:  Diagnosis Date   Chronic diastolic heart failure (HCC)    COPD (chronic obstructive pulmonary disease) (HCC)    Diabetes mellitus without complication (HCC)    ESRD on dialysis (HCC)    Hypertension    Severe aortic stenosis     Past Surgical History:  Procedure Laterality Date   A/V  SHUNT INTERVENTION Left 12/21/2021   Procedure: A/V SHUNT INTERVENTION;  Surgeon: Marea Selinda RAMAN, MD;  Location: [facility];  Service: Cardiovascular;  Laterality: Left;   APPENDECTOMY     AV FISTULA PLACEMENT Left 03/16/2021   Procedure: INSERTION OF ARTERIOVENOUS (AV) GORE-TEX GRAFT ARM;  Surgeon: Marea Selinda RAMAN, MD;  Location: [facility];  Service: Vascular;  Laterality: Left;   DIALYSIS/PERMA CATHETER INSERTION N/A 05/16/2020   Procedure: DIALYSIS/PERMA CATHETER INSERTION;  Surgeon: Marea Selinda RAMAN, MD;  Location: [facility];  Service: Cardiovascular;  Laterality: N/A;   DIALYSIS/PERMA CATHETER REMOVAL N/A 03/26/2022   Procedure: DIALYSIS/PERMA CATHETER REMOVAL;  Surgeon: Marea Selinda RAMAN, MD;  Location: [facility];  Service: Cardiovascular;  Laterality: N/A;   DIALYSIS/PERMA CATHETER REPAIR N/A 01/22/2022   Procedure: DIALYSIS/PERMA CATHETER INSERT;  Surgeon: Marea Selinda RAMAN, MD;  Location: [facility];  Service: Cardiovascular;  Laterality: N/A;   MANDIBLE SURGERY     RENAL BIOPSY Right    RIGHT HEART CATH N/A 05/10/2020   Procedure: RIGHT HEART CATH;  Surgeon: Mady Bruckner, MD;  Location: [facility];  Service: Cardiovascular;  Laterality: N/A;   RIGHT/LEFT HEART CATH AND CORONARY ANGIOGRAPHY Bilateral 08/15/2023   Procedure: RIGHT/LEFT HEART CATH AND CORONARY ANGIOGRAPHY;  Surgeon: Anner Alm ORN, MD;  Location: [facility];  Service: Cardiovascular;  Laterality: Bilateral;   TEMPORARY DIALYSIS CATHETER N/A 05/11/2020   Procedure: TEMPORARY DIALYSIS CATHETER;  Surgeon: Marea Selinda RAMAN, MD;  Location: [facility];  Service: Cardiovascular;  Laterality: N/A;    Current Outpatient Medications  Medication Sig Dispense Refill   acetaminophen  (TYLENOL ) 500 MG tablet Take 500 mg by mouth 3 (three) times daily. Take additional 500 mg every 6 hours as needed for pain     ADVAIR DISKUS 250-50 MCG/ACT AEPB Inhale 1 puff into the lungs 2 (two) times  daily.     albuterol  (VENTOLIN  HFA) 108 (90 Base) MCG/ACT inhaler Inhale 1 puff into the lungs every 6 (six) hours as needed for wheezing.     aspirin  81 MG chewable tablet Chew 81 mg by mouth daily.     calcium  carbonate (TUMS - DOSED IN MG ELEMENTAL CALCIUM ) 500 MG chewable tablet Chew 1 tablet by mouth 3 (three) times daily as needed (GRED).     DULoxetine  (CYMBALTA ) 30 MG capsule Take 30 mg by mouth daily.     famotidine  (PEPCID ) 20 MG tablet Take 10 mg by mouth at bedtime.     loperamide  (IMODIUM  A-D) 2 MG tablet Take 2 mg by mouth. TAKE 1 TABLET BY MOUTH ONCE DAILY AS NEEDED FOR LOOSE STOOLS OR DIARRHEA     melatonin 3 MG TABS tablet Take 3 mg by mouth at bedtime.     midodrine  (PROAMATINE ) 5 MG tablet Take 5 mg by mouth. Monday, Wednesday, Friday prior to dialysis     ondansetron  (ZOFRAN ) 4 MG tablet Take 4 mg by mouth every Monday, Wednesday, and Friday.     oxycodone  (OXY-IR) 5 MG capsule Take 5 mg by mouth every 6 (six) hours as needed.     pantoprazole  (PROTONIX ) 40 MG tablet Take 40 mg by mouth daily.     pramoxine (SARNA SENSITIVE) 1 % LOTN Apply 1 Application topically 2 (two) times daily.     tiZANidine  (ZANAFLEX ) 2 MG tablet Take 2 mg by mouth at bedtime.     topiramate  (TOPAMAX ) 50 MG tablet Take 1 tablet (50 mg total) by mouth 2 (two) times daily as needed (headache).     traZODone  (DESYREL ) 50 MG tablet Take 75 mg by mouth at bedtime.     bisacodyl  (DULCOLAX) 5 MG EC tablet Take 2 tablets (10 mg total) by mouth at bedtime. (Patient not taking: Reported on 02/10/2024)     calcitRIOL  (ROCALTROL ) 0.25 MCG capsule Take 1 capsule (0.25 mcg total) by mouth daily. (Patient not taking: Reported on 02/10/2024)     guaiFENesin -dextromethorphan  (ROBITUSSIN DM) 100-10 MG/5ML syrup Take 10 mLs by mouth every 6 (six) hours as needed for cough. (Patient not taking: Reported on 02/10/2024)     metoprolol  tartrate (LOPRESSOR ) 25 MG tablet Take 1 tablet (25 mg total) by mouth 2 (two) times  daily as needed (SBP >150). (Patient not taking: Reported on 02/10/2024)     polyethylene glycol (MIRALAX  / GLYCOLAX ) 17 g packet Take 17 g by mouth 2 (two) times daily. (Patient not taking: Reported on 02/10/2024)     No current facility-administered medications for this visit.    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  Tobacco Use   Smoking status: Former  Types: Cigarettes   Smokeless tobacco: Never   Tobacco comments:    Quit over 15 years ago  Vaping Use   Vaping status: Never Used  Substance and Sexual Activity   Alcohol  use: Not Currently   Drug use: Never   Sexual activity: Not on file  Other Topics Concern   Not on file  Social History Narrative   Not on file   Social Drivers of Health   Financial Resource Strain: Not on file  Food Insecurity: No Food Insecurity (02/01/2024)   Hunger Vital Sign    Worried About Running Out of Food in the Last Year: Never true    Ran Out of Food in the Last Year: Never true  Transportation Needs: No Transportation Needs (02/01/2024)   PRAPARE - Administrator, Civil Service (Medical): No    Lack of Transportation (Non-Medical): No  Physical Activity: Not on file  Stress: Not on file  Social Connections: Socially Isolated (02/01/2024)   Social Connection and Isolation Panel    Frequency of Communication with Friends and Family: More than three times a week    Frequency of Social Gatherings with Friends and Family: Patient declined    Attends Religious Services: Patient declined    Database administrator or Organizations: No    Attends Tax inspector Meetings: Never    Marital Status: Never married  Intimate Partner Violence: Not At Risk (02/01/2024)   Humiliation, Afraid, Rape, and Kick questionnaire    Fear of Current or Ex-Partner: No    Emotionally Abused: No    Physically Abused: No    Sexually Abused: No    Family History  Problem Relation Age of Onset   Heart attack Mother    Asthma Mother    Uterine cancer Mother    Skin cancer Father    Prostate cancer Father    CAD Father    Parkinson's disease Father    Lung cancer Brother     Review of Systems:  As stated in the HPI and otherwise negative.   BP (!) 144/58   Pulse 73   Ht 5' (1.524 m)   Wt 119 lb (54 kg)   SpO2 94%   BMI 23.24 kg/m   Physical Examination:  General: Thin, cachectic female in NAD HEENT: OP clear, mucus membranes moist  SKIN: warm, dry. No rashes. Neuro: No focal deficits  Musculoskeletal: Muscle strength 5/5 all ext  Psychiatric: Mood and affect normal  Neck: No JVD, no carotid bruits, no thyromegaly, no lymphadenopathy.  Lungs:Clear bilaterally, no wheezes, rhonci, crackles Cardiovascular: Regular rate and rhythm. Systolic murmur.  Abdomen:Soft. Bowel sounds present. Non-tender.  Extremities: No lower extremity edema. Pulses are 2 + in the bilateral DP/PT.  EKG:  EKG is not ordered today. The ekg ordered today demonstrates   Echo February 2025:  1. Left ventricular ejection fraction, by estimation, is 60 to 65%. The  left ventricle has normal function. The left ventricle has no regional  wall motion abnormalities. There is moderate left ventricular hypertrophy.  Left ventricular diastolic  parameters are consistent with Grade I diastolic dysfunction (impaired  relaxation).   2. Right ventricular systolic function is normal. The right ventricular  size is normal. There is mildly elevated pulmonary artery systolic  pressure. The estimated right ventricular systolic pressure is 40.5 mmHg.   3. Left atrial size was  mildly dilated.   4. The mitral valve is normal in structure. No evidence of mitral valve  regurgitation.  No evidence of mitral stenosis. The mean mitral valve  gradient is 4.0 mmHg. Moderate mitral annular calcification.   5. The aortic valve is calcified. There is severe calcifcation of the  aortic valve. Aortic valve regurgitation is mild. Severe aortic valve  stenosis. Aortic valve area, by VTI measures 0.87 cm. Aortic valve mean  gradient measures 36.5 mmHg. Aortic  valve Vmax measures 4.75 m/s.   6. There is borderline dilatation of the ascending aorta, measuring 39  mm.   7. The inferior vena cava is normal in size with greater than 50%  respiratory variability, suggesting right atrial pressure of 3 mmHg.   Comparison(s): Previous AV meas, peak PG, 29 mmHg mean PG.   FINDINGS   Left Ventricle: Left ventricular ejection fraction, by estimation, is 60  to 65%. The left ventricle has normal function. The left ventricle has no  regional wall motion abnormalities. The left ventricular internal cavity  size was normal in size. There is   moderate left ventricular hypertrophy. Left ventricular diastolic  parameters are consistent with Grade I diastolic dysfunction (impaired  relaxation).   Right Ventricle: The right ventricular size is normal. No increase in  right ventricular wall thickness. Right ventricular systolic function is  normal. There is mildly elevated pulmonary artery systolic pressure. The  tricuspid regurgitant velocity is 2.98   m/s, and with an assumed right atrial pressure of 5 mmHg, the estimated  right ventricular systolic pressure is 40.5 mmHg.   Left Atrium: Left atrial size was mildly dilated.   Right Atrium: Right atrial size was normal in size.   Pericardium: There is no evidence of pericardial effusion.   Mitral Valve: The mitral valve is normal in structure. There is mild  calcification of the mitral valve leaflet(s). Moderate mitral annular   calcification. No evidence of mitral valve regurgitation. No evidence of  mitral valve stenosis. MV peak gradient,  10.9 mmHg. The mean mitral valve gradient is 4.0 mmHg.   Tricuspid Valve: The tricuspid valve is normal in structure. Tricuspid  valve regurgitation is mild . No evidence of tricuspid stenosis.   Aortic Valve: The aortic valve is calcified. There is severe calcifcation  of the aortic valve. Aortic valve regurgitation is mild. Severe aortic  stenosis is present. Aortic valve mean gradient measures 36.5 mmHg. Aortic  valve peak gradient measures 90.2  mmHg. Aortic valve area, by VTI measures 0.87 cm.   Pulmonic Valve: The pulmonic valve was normal in structure. Pulmonic valve  regurgitation is not visualized. No evidence of pulmonic stenosis.   Aorta: The aortic root is normal in size and structure. There is  borderline dilatation of the ascending aorta, measuring 39 mm.   Venous: The inferior vena cava is normal in size with greater than 50%  respiratory variability, suggesting right atrial pressure of 3 mmHg.   IAS/Shunts: No atrial level shunt detected by color flow Doppler.     LEFT VENTRICLE  PLAX 2D  LVIDd:         4.20 cm      Diastology  LVIDs:         2.55 cm      LV e' medial:    5.00 cm/s  LV PW:         0.80 cm      LV E/e' medial:  24.0  LV IVS:        1.70 cm      LV e' lateral:   5.66 cm/s  LVOT diam:  1.90 cm      LV E/e' lateral: 21.2  LV SV:         83  LV SV Index:   49  LVOT Area:     2.84 cm    LV Volumes (MOD)  LV vol d, MOD A4C: 114.0 ml  LV vol s, MOD A4C: 30.6 ml  LV SV MOD A4C:     114.0 ml   RIGHT VENTRICLE             IVC  RV Basal diam:  3.30 cm     IVC diam: 1.00 cm  RV S prime:     12.50 cm/s  TAPSE (M-mode): 2.5 cm   LEFT ATRIUM             Index        RIGHT ATRIUM           Index  LA diam:        3.30 cm 1.96 cm/m   RA Area:     20.90 cm  LA Vol (A2C):   62.4 ml 37.02 ml/m  RA Volume:   63.80 ml  37.85 ml/m  LA  Vol (A4C):   78.0 ml 46.27 ml/m  LA Biplane Vol: 70.7 ml 41.94 ml/m   AORTIC VALVE                     PULMONIC VALVE  AV Area (Vmax):    0.83 cm      PV Vmax:       0.88 m/s  AV Area (Vmean):   0.92 cm      PV Peak grad:  3.1 mmHg  AV Area (VTI):     0.87 cm  AV Vmax:           475.00 cm/s  AV Vmean:          298.000 cm/s  AV VTI:            0.951 m  AV Peak Grad:      90.2 mmHg  AV Mean Grad:      36.5 mmHg  LVOT Vmax:         139.50 cm/s  LVOT Vmean:        96.325 cm/s  LVOT VTI:          0.292 m  LVOT/AV VTI ratio: 0.31    AORTA  Ao Root diam: 3.20 cm  Ao Asc diam:  3.90 cm  Ao Arch diam: 2.5 cm   MITRAL VALVE                TRICUSPID VALVE  MV Area (PHT): 3.17 cm     TR Peak grad:   35.5 mmHg  MV Area VTI:   1.59 cm     TR Vmax:        298.00 cm/s  MV Peak grad:  10.9 mmHg  MV Mean grad:  4.0 mmHg     SHUNTS  MV Vmax:       1.65 m/s     Systemic VTI:  0.29 m  MV Vmean:      96.3 cm/s    Systemic Diam: 1.90 cm  MV Decel Time: 239 msec  MV E velocity: 120.00 cm/s  MV A velocity: 174.00 cm/s  MV E/A ratio:  0.69   Cardiac cath 08/15/23:    Very Small Caliber Diagonal Branches: 1st Diag lesion is 90% stenosed. 2nd Diag lesion is 80% stenosed.  Mid LAD lesion is 55% stenosed.   LV end diastolic pressure is normal.   There is severe aortic valve stenosis.   Anticipated discharge date to be determined.   Plan per discussion with primary cardiology team and nephrologist would be to have the patient be admitted under the hospitalist service.  She will need to have dialysis done while she is here and have titration of medications.   Recommend Aspirin  81mg  daily for moderate CAD.   Dominance: Codominant  POST-OPERATIVE DIAGNOSIS:   Severe heavily calcified aortic stenosis with mean gradient by cardiac catheterization of 40 to 50 mmHg (peak to peak gradient 59 mmHg) Single-vessel disease with a 60% LAD stenosis just after tandem very small caliber D1 and D2 (ostium of  D1-D2 a roughly 90 and 80%); large codominant LCx with relatively no significant disease; moderate caliber codominant RCA with minimal disease in the PDA. RHC hemodynamics: RAP 1 mmHg, RV P-EDP 44/1-2 mmHg; PAP-mean 44/12-24 mmHg, PCWP mean 5 mmHg.;  Fick Cardiac Output-Index 6.41-3.87 with ao sat 93% and PA sat 70%. LV P-EDP 191/1-23 mmHg; AO P-MAP 140/61-95 mmHg Aortic AoV gradient P-P59 0.5 mmHg; mean AoV gradient estimated 45 to 50 mmHg     PT TO BE ADMITTED PER NEPHROLOGIST, DR LATEEF    Plan per discussion with primary cardiology team and nephrologist would be to have the patient be admitted under the hospitalist service.  She will need to have dialysis done while she is here and have titration of medications.   Recommend Aspirin  81mg  daily for moderate CAD.   Will need outpatient structural heart team       Alm Clay, MD       Recommendations  Antiplatelet/Anticoag Recommend Aspirin  81mg  daily for moderate CAD.  Discharge Date Anticipated discharge date to be determined. Plan per discussion with primary cardiology team and nephrologist would be to have the patient be admitted under the hospitalist service.  She will need to have dialysis done while she is here and have titration of medications.   Surgeon Notes    08/15/2023  1:30 PM Brief Op Note signed by Clay Alm ORN, MD   Indications  Coronary artery calcification seen on computed tomography [I25.10 (ICD-10-CM)]  Aortic valve stenosis, etiology of cardiac valve disease unspecified [I35.0 (ICD-10-CM)]   Clinical Presentation  CHF/Shock Congestive heart failure not present. No shock present.   Procedural Details  Technical Details PCP: [facility] Cardiologist:  Redell Cave, MD; Cadence Franchester, [place]    PATIENT:  Katie [name]  68 y.o. female former (30 years) with a history of three-vessel coronary calcification noted on CT scan, HFpEF, ESRD on HD M-W-F with prior CVA with  recently diagnosed severe aortic stenosis who is referred for right and left heart catheterization as part of preoperative evaluation for likely TAVR.  She has noted combination of exertional dyspnea with intermittent chest pains.  Has had issues with hypotension during dialysis.  She was seen by Mikey Franchester, PA PA on 08/08/2023 to follow-up after echocardiogram and scheduled procedure.   PRE-OPERATIVE DIAGNOSIS: Severe Aortic Stenosis; Coronary Artery Calcification on CT  PROCEDURE PERFORMED: Time Out: Verified patient identification, verified procedure, site/side was marked, verified correct patient position, special equipment/implants available, medications/allergies/relevent history reviewed, required imaging and test results available. Performed.   Access:  RIGHT Radial Artery: 6 Fr sheath -- Seldinger technique using glide sheath micropuncture kit   Direct ultrasound guidance used.  Permanent image obtained and placed on chart.  10 mL  radial cocktail IA; 3000 units IV Heparin  RIGHT Brachial Vein: 5 French sheath-modified Seldinger technique-glide sheath micropuncture kit             Direct ultrasound guidance used.  Permanent image obtained and placed on chart.   Right Heart Catheterization: 5 Fr Norva Purl catheter advanced under fluoroscopy with balloon inflated to the RA, RV, then PCWP-PA for hemodynamic measurement. *Simultaneous FA & PA blood gases checked for SaO2% to calculate FICK CO/CI. Catheter removed completely out of the body with balloon deflated.    Left Heart Catheterization: 5Fr catheters advanced or exchanged over a J-wire under direct fluoroscopic guidance into the ascending aorta; TIG 4.0 catheter advanced first.   * LV Hemodynamics (no LV Gram) & Left Coronary Artery Cineangiography: TIG 4.0 catheter; the valve was crossed using the standard J-wire with minimal difficulty, however gradient was significant.  * Right Coronary Artery Cineangiography: JR4 catheter     Upon completion of Angiogaphy, the catheter was removed completely out of the body over a wire, without complication.   Brachial Sheath(s) removed in the Cath Lab with manual pressure for hemostasis.    Radial sheath removed in the Cardiac Catheterization lab with TR Band placed for hemostasis.   TR Band: 1255 Hours; 12 mL air reverse Barbeau B   MEDICATIONS * SQ Lidocaine  3mL * Radial Cocktail: 3 mg Verapmil in 10 mL NS * Isovue Contrast: 50 mL * Heparin : 3000 units   ANESTHESIA:   local and IV sedation; 3 mL lidocaine  for both brachial and arterial access; 0.5 mg IV Versed , 25 mcg IV fentanyl  Sublimaze  for sedation   EBL: <20 mL   PATIENT DISPOSITION:  PACU - hemodynamically stable.           Estimated blood loss <50 mL.   During this procedure medications were administered to achieve and maintain moderate conscious sedation while the patient's heart rate, blood pressure, and oxygen saturation were continuously monitored and I was present face-to-face 100% of this time. Maryann McDaniel Cardiovascular Specialist and Katlyn Kiger Cardiovascular Specialist are independent, trained observers who assisted in the monitoring of the patient's level of consciousness.   Medications (Filter: Administrations occurring from 1201 to 1306 on 08/15/23) Heparin  (Porcine) in NaCl 2000-0.9 UNIT/L-% SOLN (mL)  Total volume: 1,000 mL Date/Time Rate/Dose/Volume Action   08/15/23 1202 1,000 mL Given   fentaNYL  (SUBLIMAZE ) injection (mcg)  Total dose: 25 mcg Date/Time Rate/Dose/Volume Action   08/15/23 1207 25 mcg Given   midazolam  (VERSED ) injection (mg)  Total dose: 0.5 mg Date/Time Rate/Dose/Volume Action   08/15/23 1208 0.5 mg Given   lidocaine  (PF) (XYLOCAINE ) 1 % injection (mL)  Total volume: 5 mL Date/Time Rate/Dose/Volume Action   08/15/23 1213 5 mL Given   verapamil  (ISOPTIN ) injection (mg)  Total dose: 2.5 mg Date/Time Rate/Dose/Volume Action   08/15/23 1222 2.5 mg Given    heparin  sodium (porcine) injection (Units)  Total dose: 3,000 Units Date/Time Rate/Dose/Volume Action   08/15/23 1231 3,000 Units Given   iohexol  (OMNIPAQUE ) 300 MG/ML solution (mL)  Total volume: 82 mL Date/Time Rate/Dose/Volume Action   08/15/23 1248 82 mL Given    Sedation Time  Sedation Time Physician-1: 42 minutes 43 seconds Contrast     Administrations occurring from 1201 to 1306 on 08/15/23:  Medication Name Total Dose  iohexol  (OMNIPAQUE ) 300 MG/ML solution 82 mL   Radiation/Fluoro  Fluoro time: 7.1 (min) DAP: 15.7 (Gycm2) Cumulative Air Kerma: 228 (mGy) Complications  Complications documented before study signed (08/15/2023  2:31 PM)  No complications were associated with this study.  Documented by Kiger, Katlyn, RT - 08/15/2023 12:49 PM     Coronary Findings  Diagnostic Dominance: Right Left Anterior Descending  Vessel is large. The vessel exhibits minimal luminal irregularities. The vessel is moderately calcified. Proximal one third to half of vessel  Mid LAD lesion is 55% stenosed. The lesion is distal to major branch, focal and concentric.    First Diagonal Branch  Vessel is small in size. There is moderate disease in the vessel.  1st Diag lesion is 90% stenosed.    Second Diagonal Branch  Vessel is small in size. There is mild disease in the vessel.  2nd Diag lesion is 80% stenosed.    Left Circumflex  Vessel was injected. Vessel is large and very large. Vessel is angiographically normal. The vessel originates from a separate ostium. Essentially cloacal left main The main circumflex terminates as a large lateral bifurcating OM 2    First Obtuse Marginal Branch  Vessel is small in size.    Third Obtuse Marginal Branch  Vessel is small in size.    First Left Posterolateral Branch  Vessel is small in size.    Left Posterior Atrioventricular Artery  Vessel is small in size.    Right Coronary Artery  Vessel was injected. Vessel is normal in  caliber. Vessel is angiographically normal.    Right Posterior Descending Artery  Vessel is small in size. There is mild disease in the vessel.    First Right Posterolateral Branch  Vessel is small in size.    Intervention   No interventions have been documented.   Right Heart  Right Heart Pressures PAP-mean 44/12-24 mmHg, PCWP mean 5 mmHg. LV EDP is normal. LV P-EDP 191/1-23 mmHg; AO P-MAP 140/61-95 mmHg Ao sat 93% and PA sat 70%; Fick Cardiac Output-Index 6.41-3.87 :.  Right Atrium Right atrial pressure is decreased. RAP 1 mmHg  Right Ventricle RV P-EDP 44/1-2 mmHg   Wall Motion  No LV gram         Left Heart  Aortic Valve There is severe aortic valve stenosis. Aortic AoV gradient P-P59 0.5 mmHg; mean AoV gradient estimated 45 to 50 mmHg The aortic valve is calcified.   Coronary Diagrams  Diagnostic Dominance: Right  Intervention   Implants   No implant documentation for this case.   Syngo Images   Show images for CARDIAC CATHETERIZATION Images on Long Term Storage   Show images for [place], [place] to Procedure Log  Procedure Log    Hemodynamics  Pressures Phases Resting  Right     RA Mean  mmHg 1    RA A-Wave  mmHg 1    RV  mmHg 44/0  Pulmonary     PA  mmHg 53/48 (52)    PCW Mean  mmHg 4.0    PCW A-Wave  mmHg 8.0    PCW V-Wave  mmHg 5.0    PAPi   5.0    Hemo Data  Flowsheet Row Most Recent Value  Fick Cardiac Output 6.41 L/min  Fick Cardiac Output Index 3.87 (L/min)/BSA  Aortic Mean Gradient 49.96 mmHg  Aortic Peak Gradient 59.5 mmHg  Aortic Valve Area 0.79  Aortic Value Area Index 0.48 cm2/BSA  RA A Wave 1 mmHg  RA V Wave -1 mmHg  RA Mean -3 mmHg  RV Systolic Pressure 42 mmHg  RV Diastolic Pressure -10 mmHg  RV EDP -1 mmHg  PA Systolic Pressure 53 mmHg  PA Diastolic Pressure  48 mmHg  PA Mean 52 mmHg  PW A Wave 8 mmHg  PW V Wave 5 mmHg  PW Mean 4 mmHg  AO Systolic Pressure 128 mmHg  AO Diastolic Pressure 44 mmHg  AO Mean 79  mmHg  LV Systolic Pressure 197 mmHg  LV Diastolic Pressure -13 mmHg  LV EDP 6 mmHg  Arterial Occlusion Pressure Extended Systolic Pressure 145 mmHg  Arterial Occlusion Pressure Extended Diastolic Pressure 61 mmHg  Arterial Occlusion Pressure Extended Mean Pressure 95 mmHg  Left Ventricular Apex Extended Systolic Pressure 190 mmHg  LVp Diastolic Pressure 1 mmHg  Left Ventricular Apex Extended EDP Pressure 23 mmHg  QP/QS 1  TPVR Index 6.21 HRUI  TSVR Index 20.44 HRUI  TPVR/TSVR Ratio 0.3    Recent Labs: 01/13/2024: B Natriuretic Peptide 1,301.5; TSH 1.285 01/16/2024: Magnesium  2.1 02/04/2024: ALT 121; BUN 33; Creatinine, Ser 3.75; Hemoglobin 11.1; Platelets 118; Potassium 4.1; Sodium 135    Wt Readings from Last 3 Encounters:  02/10/24 119 lb (54 kg)  02/08/24 118 lb (53.5 kg)  02/03/24 118 lb 6.2 oz (53.7 kg)    Assessment and Plan:   1. Severe Aortic Valve Stenosis: She has severe, stage D aortic valve stenosis. She has NYHA class 3 symptoms. I have personally reviewed the echo images. The aortic valve is thickened and calcified with limited leaflet mobility. While she has severe aortic stenosis, she is not felt to be a candidate for TAVR given her multiple co-morbidities including recent intracranial bleeding, COPD and ESRD.    I have told her today that she is not a candidate for TAVR. Will arrange f/u with her primary cardiology team in [place].     Labs/ tests ordered today include:  No orders of the defined types were placed in this encounter.   Signed, Lonni Cash, MD, [facility] 02/10/2024 10:44 AM    [facility] [address] [place], [place], [place]  [address] Phone: [phone]; Fax: [phone]

## 2024-02-10 NOTE — Patient Instructions (Addendum)
 Medication Instructions:  Your physician recommends that you continue on your current medications as directed. Please refer to the Current Medication list given to you today.  *If you need a refill on your cardiac medications before your next appointment, please call your pharmacy*  Lab Work: none If you have labs (blood work) drawn today and your tests are completely normal, you will receive your results only by: MyChart Message (if you have MyChart) OR A paper copy in the mail If you have any lab test that is abnormal or we need to change your treatment, we will call you to review the results.  Testing/Procedures: none  Follow-Up: At [facility], you and your health needs are our priority.  As part of our continuing mission to provide you with exceptional heart care, our providers are all part of one team.  This team includes your primary Cardiologist (physician) and Advanced Practice Providers or APPs (Physician Assistants and Nurse Practitioners) who all work together to provide you with the care you need, when you need it.  Your next appointment:   12/9  Provider:   Cadence Franchester  We recommend signing up for the patient portal called MyChart.  Sign up information is provided on this After Visit Summary.  MyChart is used to connect with patients for Virtual Visits (Telemedicine).  Patients are able to view lab/test results, encounter notes, upcoming appointments, etc.  Non-urgent messages can be sent to your provider as well.   To learn more about what you can do with MyChart, go to [email or web address].   Other Instructions

## 2024-02-17 NOTE — Telephone Encounter (Signed)
 CT scheduled for 02/18/2024 and appointment scheduled for 03/03/2024. Confirmed with Damien at [facility].

## 2024-02-17 NOTE — Telephone Encounter (Signed)
 Left message for Damien at [facility] to call our office back.

## 2024-02-18 ENCOUNTER — Ambulatory Visit
Admission: RE | Admit: 2024-02-18 | Discharge: 2024-02-18 | Disposition: A | Discharge status: Home / Self Care | Source: Ambulatory Visit | Attending: Neurosurgery | Admitting: Neurosurgery

## 2024-02-18 DIAGNOSIS — S06330S Contusion and laceration of cerebrum, unspecified, without loss of consciousness, sequela: Secondary | ICD-10-CM | POA: Insufficient documentation

## 2024-02-26 NOTE — Progress Notes (Unsigned)
 Referring Physician:  Cordella Corning, FNP [address] [place],  [place] [address]  Primary Physician:  Cordella Corning, FNP  History of Present Illness: 03/03/2024 Katie [name] is here today for follow-up on intraparenchymal hematoma and MCA PCA on the right found after  an episode of somnolence on 9/19.  She states that she used to have some headaches and dizziness.  Currently in a facility. Has some orthostatic hypotneison     Conservative measures:  Multimodal medical therapy including regular antiinflammatories: *** Tylenol , Oxycodone , Cymbalata  Past Surgery: ***no spine surgery  Katie [name] has ***no symptoms of cervical myelopathy.  The symptoms are causing a significant impact on the patient's life.   Review of Systems:  A 10 point review of systems is negative, except for the pertinent positives and negatives detailed in the HPI.  Past Medical History: Past Medical History:  Diagnosis Date   Chronic diastolic heart failure (HCC)    COPD (chronic obstructive pulmonary disease) (HCC)    Diabetes mellitus without complication (HCC)    ESRD on dialysis (HCC)    Hypertension    Severe aortic stenosis     Past Surgical History: Past Surgical History:  Procedure Laterality Date   A/V SHUNT INTERVENTION Left 12/21/2021   Procedure: A/V SHUNT INTERVENTION;  Surgeon: Marea Selinda RAMAN, MD;  Location: [facility] INVASIVE [facility];  Service: Cardiovascular;  Laterality: Left;   APPENDECTOMY     AV FISTULA PLACEMENT Left 03/16/2021   Procedure: INSERTION OF ARTERIOVENOUS (AV) GORE-TEX GRAFT ARM;  Surgeon: Marea Selinda RAMAN, MD;  Location: [facility] ORS;  Service: Vascular;  Laterality: Left;   DIALYSIS/PERMA CATHETER INSERTION N/A 05/16/2020   Procedure: DIALYSIS/PERMA CATHETER INSERTION;  Surgeon: Marea Selinda RAMAN, MD;  Location: [facility];  Service: Cardiovascular;  Laterality: N/A;   DIALYSIS/PERMA CATHETER REMOVAL N/A 03/26/2022   Procedure: DIALYSIS/PERMA CATHETER  REMOVAL;  Surgeon: Marea Selinda RAMAN, MD;  Location: [facility];  Service: Cardiovascular;  Laterality: N/A;   DIALYSIS/PERMA CATHETER REPAIR N/A 01/22/2022   Procedure: DIALYSIS/PERMA CATHETER INSERT;  Surgeon: Marea Selinda RAMAN, MD;  Location: [facility];  Service: Cardiovascular;  Laterality: N/A;   MANDIBLE SURGERY     RENAL BIOPSY Right    RIGHT HEART CATH N/A 05/10/2020   Procedure: RIGHT HEART CATH;  Surgeon: Mady Bruckner, MD;  Location: [facility];  Service: Cardiovascular;  Laterality: N/A;   RIGHT/LEFT HEART CATH AND CORONARY ANGIOGRAPHY Bilateral 08/15/2023   Procedure: RIGHT/LEFT HEART CATH AND CORONARY ANGIOGRAPHY;  Surgeon: Anner Alm ORN, MD;  Location: [facility];  Service: Cardiovascular;  Laterality: Bilateral;   TEMPORARY DIALYSIS CATHETER N/A 05/11/2020   Procedure: TEMPORARY DIALYSIS CATHETER;  Surgeon: Marea Selinda RAMAN, MD;  Location: [facility];  Service: Cardiovascular;  Laterality: N/A;    Allergies: Allergies as of 03/03/2024 - Review Complete 03/03/2024  Allergen Reaction Noted   Penicillins Nausea And Vomiting 05/10/2020    Medications: Outpatient Encounter Medications as of 03/03/2024  Medication Sig   acetaminophen  (TYLENOL ) 500 MG tablet Take 500 mg by mouth 3 (three) times daily. Take additional 500 mg every 6 hours as needed for pain   ADVAIR DISKUS 250-50 MCG/ACT AEPB Inhale 1 puff into the lungs 2 (two) times daily.   albuterol  (VENTOLIN  HFA) 108 (90 Base) MCG/ACT inhaler Inhale 1 puff into the lungs every 6 (six) hours as needed for wheezing.   aspirin  81 MG chewable tablet Chew 81 mg by mouth  daily.   bisacodyl  (DULCOLAX) 5 MG EC tablet Take 2 tablets (10 mg total) by mouth at bedtime. (Patient not taking: Reported on 02/10/2024)   calcitRIOL  (ROCALTROL ) 0.25 MCG capsule Take 1 capsule (0.25 mcg total) by mouth daily. (Patient not taking: Reported on 02/10/2024)   calcium  carbonate (TUMS - DOSED IN MG ELEMENTAL  CALCIUM ) 500 MG chewable tablet Chew 1 tablet by mouth 3 (three) times daily as needed (GRED).   DULoxetine  (CYMBALTA ) 30 MG capsule Take 30 mg by mouth daily.   famotidine  (PEPCID ) 20 MG tablet Take 10 mg by mouth at bedtime.   guaiFENesin -dextromethorphan  (ROBITUSSIN DM) 100-10 MG/5ML syrup Take 10 mLs by mouth every 6 (six) hours as needed for cough. (Patient not taking: Reported on 02/10/2024)   loperamide  (IMODIUM  A-D) 2 MG tablet Take 2 mg by mouth. TAKE 1 TABLET BY MOUTH ONCE DAILY AS NEEDED FOR LOOSE STOOLS OR DIARRHEA   melatonin 3 MG TABS tablet Take 3 mg by mouth at bedtime.   metoprolol  tartrate (LOPRESSOR ) 25 MG tablet Take 1 tablet (25 mg total) by mouth 2 (two) times daily as needed (SBP >150). (Patient not taking: Reported on 02/10/2024)   midodrine  (PROAMATINE ) 5 MG tablet Take 5 mg by mouth. Monday, Wednesday, Friday prior to dialysis   ondansetron  (ZOFRAN ) 4 MG tablet Take 4 mg by mouth every Monday, Wednesday, and Friday.   oxycodone  (OXY-IR) 5 MG capsule Take 5 mg by mouth every 6 (six) hours as needed.   pantoprazole  (PROTONIX ) 40 MG tablet Take 40 mg by mouth daily.   polyethylene glycol (MIRALAX  / GLYCOLAX ) 17 g packet Take 17 g by mouth 2 (two) times daily. (Patient not taking: Reported on 02/10/2024)   pramoxine (SARNA SENSITIVE) 1 % LOTN Apply 1 Application topically 2 (two) times daily.   tiZANidine  (ZANAFLEX ) 2 MG tablet Take 2 mg by mouth at bedtime.   topiramate  (TOPAMAX ) 50 MG tablet Take 1 tablet (50 mg total) by mouth 2 (two) times daily as needed (headache).   traZODone  (DESYREL ) 50 MG tablet Take 75 mg by mouth at bedtime.   No facility-administered encounter medications on file as of 03/03/2024.    Social History: Social History   Tobacco Use   Smoking status: Former    Types: Cigarettes   Smokeless tobacco: Never   Tobacco comments:    Quit over 15 years ago  Vaping Use   Vaping status: Never Used  Substance Use Topics   Alcohol  use: Not  Currently   Drug use: Never    Family Medical History: Family History  Problem Relation Age of Onset   Heart attack Mother    Asthma Mother    Uterine cancer Mother    Skin cancer Father    Prostate cancer Father    CAD Father    Parkinson's disease Father    Lung cancer Brother     Physical Examination: @VITALWITHPAIN @  General: Patient is well developed, well nourished, calm, collected, and in no apparent distress. Attention to examination is appropriate.  Psychiatric: Patient is non-anxious.  Head:  Pupils equal, round, and reactive to light.  ENT:  Oral mucosa appears well hydrated.  Neck:   Supple.  ***Full range of motion.  Respiratory: Patient is breathing without any difficulty.  Extremities: No edema.  Vascular: Palpable dorsal pedal pulses.  Skin:   On exposed skin, there are no abnormal skin lesions.  NEUROLOGICAL:     Awake, alert, oriented to person, place, and time.  Speech is clear  and fluent. Fund of knowledge is appropriate.   Cranial Nerves: Pupils equal round and reactive to light.  Facial tone is symmetric.  Facial sensation is symmetric.  ROM of spine: ***full.  Palpation of spine: ***non tender.    Strength: Side Biceps Triceps Deltoid Interossei Grip Wrist Ext. Wrist Flex.  R 5 5 5 5 5 5 5   L 5 5 5 5 5 5 5    Side Iliopsoas Quads Hamstring PF DF EHL  R 5 5 5 5 5 5   L 5 5 5 5 5 5    Reflexes are ***2+ and symmetric at the biceps, triceps, brachioradialis, patella and achilles.   Hoffman's is absent.  Clonus is not present.  Toes are down-going.  Bilateral upper and lower extremity sensation is intact to light touch.    Gait is normal.   No difficulty with tandem gait.   No evidence of dysmetria noted.  Medical Decision Making  Imaging: EXAM: CT HEAD WITHOUT CONTRAST 02/18/2024 02:31:08 PM   TECHNIQUE: CT of the head was performed without the administration of intravenous contrast. Automated exposure control, iterative  reconstruction, and/or weight based adjustment of the mA/kV was utilized to reduce the radiation dose to as low as reasonably achievable.   COMPARISON: Head CT 01/31/2024, MRI brain 02/01/2024.   CLINICAL HISTORY: Hematoma. Hematoma, Intraparenchymal hematoma of brain without loss of consciousness, unspecified laterality, sequela.   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 Interval resolution of previously seen subdural hemorrhage along the left frontal convexity and right occipital lobe. No acute hemorrhage. Background of mild chronic small vessel disease, unchanged. No evidence of acute infarct. No hydrocephalus. No extra-axial collection. No mass effect or midline shift.   ORBITS: No acute abnormality.   SINUSES: No acute abnormality.   SOFT TISSUES AND SKULL: No acute soft tissue abnormality. No skull fracture.   IMPRESSION: 1. No acute intracranial abnormality. 2. 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 3. Interval resolution of previously seen subdural hemorrhage along the left frontal convexity and right occipital lobe.    I have personally reviewed the images and agree with the above interpretation.  Assessment and Plan: Katie [name] is a pleasant 68 y.o. female with ***    Thank you for involving me in the care of this patient.   I spent a total of *** minutes in both face-to-face and non-face-to-face activities for this visit on the date of this encounter.   Lyle Decamp, PA-C Dept. of Neurosurgery

## 2024-03-03 ENCOUNTER — Ambulatory Visit (INDEPENDENT_AMBULATORY_CARE_PROVIDER_SITE_OTHER): Admitting: Physician Assistant

## 2024-03-03 VITALS — BP 110/70 | Wt 119.0 lb

## 2024-03-03 DIAGNOSIS — S06330D Contusion and laceration of cerebrum, unspecified, without loss of consciousness, subsequent encounter: Secondary | ICD-10-CM | POA: Diagnosis not present

## 2024-03-03 DIAGNOSIS — S06330S Contusion and laceration of cerebrum, unspecified, without loss of consciousness, sequela: Secondary | ICD-10-CM

## 2024-03-25 ENCOUNTER — Emergency Department

## 2024-03-25 ENCOUNTER — Other Ambulatory Visit: Payer: Self-pay

## 2024-03-25 ENCOUNTER — Inpatient Hospital Stay
Admission: EM | Admit: 2024-03-25 | Discharge: 2024-03-31 | DRG: 306 | Disposition: A | Discharge status: Home Health | Source: Ambulatory Visit | Attending: Emergency Medicine | Admitting: Emergency Medicine

## 2024-03-25 ENCOUNTER — Encounter: Payer: Self-pay | Admitting: Student in an Organized Health Care Education/Training Program

## 2024-03-25 DIAGNOSIS — I7121 Aneurysm of the ascending aorta, without rupture: Secondary | ICD-10-CM | POA: Diagnosis present

## 2024-03-25 DIAGNOSIS — J449 Chronic obstructive pulmonary disease, unspecified: Secondary | ICD-10-CM | POA: Diagnosis present

## 2024-03-25 DIAGNOSIS — F4323 Adjustment disorder with mixed anxiety and depressed mood: Secondary | ICD-10-CM | POA: Diagnosis present

## 2024-03-25 DIAGNOSIS — E1122 Type 2 diabetes mellitus with diabetic chronic kidney disease: Secondary | ICD-10-CM | POA: Diagnosis present

## 2024-03-25 DIAGNOSIS — Z8049 Family history of malignant neoplasm of other genital organs: Secondary | ICD-10-CM

## 2024-03-25 DIAGNOSIS — Z1152 Encounter for screening for COVID-19: Secondary | ICD-10-CM | POA: Diagnosis not present

## 2024-03-25 DIAGNOSIS — Z808 Family history of malignant neoplasm of other organs or systems: Secondary | ICD-10-CM

## 2024-03-25 DIAGNOSIS — S0083XA Contusion of other part of head, initial encounter: Secondary | ICD-10-CM | POA: Diagnosis present

## 2024-03-25 DIAGNOSIS — I050 Rheumatic mitral stenosis: Secondary | ICD-10-CM

## 2024-03-25 DIAGNOSIS — I2510 Atherosclerotic heart disease of native coronary artery without angina pectoris: Secondary | ICD-10-CM | POA: Diagnosis present

## 2024-03-25 DIAGNOSIS — Z825 Family history of asthma and other chronic lower respiratory diseases: Secondary | ICD-10-CM

## 2024-03-25 DIAGNOSIS — Z8673 Personal history of transient ischemic attack (TIA), and cerebral infarction without residual deficits: Secondary | ICD-10-CM

## 2024-03-25 DIAGNOSIS — Z820 Family history of epilepsy and other diseases of the nervous system: Secondary | ICD-10-CM

## 2024-03-25 DIAGNOSIS — Z79899 Other long term (current) drug therapy: Secondary | ICD-10-CM | POA: Diagnosis not present

## 2024-03-25 DIAGNOSIS — R296 Repeated falls: Secondary | ICD-10-CM | POA: Diagnosis present

## 2024-03-25 DIAGNOSIS — Z8701 Personal history of pneumonia (recurrent): Secondary | ICD-10-CM

## 2024-03-25 DIAGNOSIS — Z801 Family history of malignant neoplasm of trachea, bronchus and lung: Secondary | ICD-10-CM

## 2024-03-25 DIAGNOSIS — S065XAS Traumatic subdural hemorrhage with loss of consciousness status unknown, sequela: Secondary | ICD-10-CM

## 2024-03-25 DIAGNOSIS — Z9181 History of falling: Secondary | ICD-10-CM

## 2024-03-25 DIAGNOSIS — I10 Essential (primary) hypertension: Secondary | ICD-10-CM | POA: Diagnosis not present

## 2024-03-25 DIAGNOSIS — I2489 Other forms of acute ischemic heart disease: Secondary | ICD-10-CM | POA: Diagnosis present

## 2024-03-25 DIAGNOSIS — Z8249 Family history of ischemic heart disease and other diseases of the circulatory system: Secondary | ICD-10-CM

## 2024-03-25 DIAGNOSIS — Z66 Do not resuscitate: Secondary | ICD-10-CM | POA: Diagnosis present

## 2024-03-25 DIAGNOSIS — I132 Hypertensive heart and chronic kidney disease with heart failure and with stage 5 chronic kidney disease, or end stage renal disease: Secondary | ICD-10-CM | POA: Diagnosis present

## 2024-03-25 DIAGNOSIS — J9811 Atelectasis: Secondary | ICD-10-CM | POA: Diagnosis present

## 2024-03-25 DIAGNOSIS — E213 Hyperparathyroidism, unspecified: Secondary | ICD-10-CM | POA: Diagnosis present

## 2024-03-25 DIAGNOSIS — Z604 Social exclusion and rejection: Secondary | ICD-10-CM | POA: Diagnosis present

## 2024-03-25 DIAGNOSIS — I214 Non-ST elevation (NSTEMI) myocardial infarction: Secondary | ICD-10-CM | POA: Diagnosis present

## 2024-03-25 DIAGNOSIS — I5032 Chronic diastolic (congestive) heart failure: Secondary | ICD-10-CM | POA: Diagnosis present

## 2024-03-25 DIAGNOSIS — N186 End stage renal disease: Secondary | ICD-10-CM

## 2024-03-25 DIAGNOSIS — I080 Rheumatic disorders of both mitral and aortic valves: Secondary | ICD-10-CM | POA: Diagnosis present

## 2024-03-25 DIAGNOSIS — D631 Anemia in chronic kidney disease: Secondary | ICD-10-CM | POA: Diagnosis present

## 2024-03-25 DIAGNOSIS — Z7951 Long term (current) use of inhaled steroids: Secondary | ICD-10-CM

## 2024-03-25 DIAGNOSIS — W19XXXA Unspecified fall, initial encounter: Secondary | ICD-10-CM | POA: Diagnosis present

## 2024-03-25 DIAGNOSIS — I350 Nonrheumatic aortic (valve) stenosis: Principal | ICD-10-CM

## 2024-03-25 DIAGNOSIS — R7989 Other specified abnormal findings of blood chemistry: Secondary | ICD-10-CM | POA: Diagnosis not present

## 2024-03-25 DIAGNOSIS — Z8042 Family history of malignant neoplasm of prostate: Secondary | ICD-10-CM

## 2024-03-25 DIAGNOSIS — Z87891 Personal history of nicotine dependence: Secondary | ICD-10-CM | POA: Diagnosis not present

## 2024-03-25 DIAGNOSIS — Z992 Dependence on renal dialysis: Secondary | ICD-10-CM | POA: Diagnosis not present

## 2024-03-25 DIAGNOSIS — Z6823 Body mass index (BMI) 23.0-23.9, adult: Secondary | ICD-10-CM

## 2024-03-25 DIAGNOSIS — R54 Age-related physical debility: Secondary | ICD-10-CM | POA: Diagnosis present

## 2024-03-25 DIAGNOSIS — Z7982 Long term (current) use of aspirin: Secondary | ICD-10-CM | POA: Diagnosis not present

## 2024-03-25 DIAGNOSIS — E119 Type 2 diabetes mellitus without complications: Secondary | ICD-10-CM

## 2024-03-25 DIAGNOSIS — R55 Syncope and collapse: Secondary | ICD-10-CM | POA: Diagnosis not present

## 2024-03-25 DIAGNOSIS — I959 Hypotension, unspecified: Secondary | ICD-10-CM | POA: Diagnosis present

## 2024-03-25 DIAGNOSIS — K219 Gastro-esophageal reflux disease without esophagitis: Secondary | ICD-10-CM | POA: Diagnosis present

## 2024-03-25 DIAGNOSIS — E785 Hyperlipidemia, unspecified: Secondary | ICD-10-CM | POA: Diagnosis present

## 2024-03-25 DIAGNOSIS — G894 Chronic pain syndrome: Secondary | ICD-10-CM | POA: Diagnosis present

## 2024-03-25 DIAGNOSIS — R000 Tachycardia, unspecified: Secondary | ICD-10-CM | POA: Diagnosis present

## 2024-03-25 DIAGNOSIS — Z87820 Personal history of traumatic brain injury: Secondary | ICD-10-CM

## 2024-03-25 DIAGNOSIS — Z880 Allergy status to penicillin: Secondary | ICD-10-CM

## 2024-03-25 LAB — COMPREHENSIVE METABOLIC PANEL WITH GFR
ALT: 60 U/L — ABNORMAL HIGH (ref 0–44)
AST: 50 U/L — ABNORMAL HIGH (ref 15–41)
Albumin: 3.3 g/dL — ABNORMAL LOW (ref 3.5–5.0)
Alkaline Phosphatase: 121 U/L (ref 38–126)
Anion gap: 11 (ref 5–15)
BUN: 30 mg/dL — ABNORMAL HIGH (ref 8–23)
CO2: 30 mmol/L (ref 22–32)
Calcium: 9.1 mg/dL (ref 8.9–10.3)
Chloride: 100 mmol/L (ref 98–111)
Creatinine, Ser: 2.13 mg/dL — ABNORMAL HIGH (ref 0.44–1.00)
GFR, Estimated: 25 mL/min — ABNORMAL LOW (ref >60)
Glucose, Bld: 76 mg/dL (ref 70–99)
Potassium: 3.9 mmol/L (ref 3.5–5.1)
Sodium: 141 mmol/L (ref 135–145)
Total Bilirubin: 0.6 mg/dL (ref 0.0–1.2)
Total Protein: 6.6 g/dL (ref 6.5–8.1)

## 2024-03-25 LAB — PROTIME-INR
INR: 1 (ref 0.8–1.2)
Prothrombin Time: 13.3 s (ref 11.4–15.2)

## 2024-03-25 LAB — TROPONIN T, HIGH SENSITIVITY: Troponin T High Sensitivity: 289 ng/L (ref 0–19)

## 2024-03-25 LAB — CBC
HCT: 39.7 % (ref 36.0–46.0)
Hemoglobin: 12.4 g/dL (ref 12.0–15.0)
MCH: 33.3 pg (ref 26.0–34.0)
MCHC: 31.2 g/dL (ref 30.0–36.0)
MCV: 106.7 fL — ABNORMAL HIGH (ref 80.0–100.0)
Platelets: 103 K/uL — ABNORMAL LOW (ref 150–400)
RBC: 3.72 MIL/uL — ABNORMAL LOW (ref 3.87–5.11)
RDW: 13.9 % (ref 11.5–15.5)
WBC: 9 K/uL (ref 4.0–10.5)
nRBC: 0 % (ref 0.0–0.2)

## 2024-03-25 LAB — RESP PANEL BY RT-PCR (RSV, FLU A&B, COVID)  RVPGX2
Influenza A by PCR: NEGATIVE
Influenza B by PCR: NEGATIVE
Resp Syncytial Virus by PCR: NEGATIVE
SARS Coronavirus 2 by RT PCR: NEGATIVE

## 2024-03-25 LAB — APTT: aPTT: 36 s (ref 24–36)

## 2024-03-25 MED ORDER — HEPARIN (PORCINE) 25000 UT/250ML-% IV SOLN
800.0000 [IU]/h | INTRAVENOUS | Status: DC
  Administered 2024-03-25: 600 [IU]/h via INTRAVENOUS
  Filled 2024-03-25: qty 250

## 2024-03-25 MED ORDER — ONDANSETRON HCL 4 MG/2ML IJ SOLN
4.0000 mg | Freq: Four times a day (QID) | INTRAMUSCULAR | Status: AC | PRN
Start: 2024-03-25 — End: 2024-03-30
  Administered 2024-03-27 – 2024-03-30 (×2): 4 mg via INTRAVENOUS

## 2024-03-25 MED ORDER — ONDANSETRON HCL 4 MG PO TABS
4.0000 mg | ORAL_TABLET | Freq: Four times a day (QID) | ORAL | Status: AC | PRN
Start: 2024-03-25 — End: 2024-03-30

## 2024-03-25 MED ORDER — FAMOTIDINE 20 MG PO TABS
10.0000 mg | ORAL_TABLET | Freq: Every day | ORAL | Status: DC
  Administered 2024-03-25 – 2024-03-30 (×6): 10 mg via ORAL
  Filled 2024-03-25 (×6): qty 1

## 2024-03-25 MED ORDER — SODIUM CHLORIDE 0.9% FLUSH
3.0000 mL | Freq: Two times a day (BID) | INTRAVENOUS | Status: DC
  Administered 2024-03-25 – 2024-03-31 (×12): 3 mL via INTRAVENOUS

## 2024-03-25 MED ORDER — MELATONIN 5 MG PO TABS
2.5000 mg | ORAL_TABLET | Freq: Every day | ORAL | Status: DC
  Administered 2024-03-25 – 2024-03-30 (×6): 2.5 mg via ORAL
  Filled 2024-03-25 (×6): qty 1

## 2024-03-25 MED ORDER — FLUTICASONE FUROATE-VILANTEROL 200-25 MCG/ACT IN AEPB
1.0000 | INHALATION_SPRAY | Freq: Every day | RESPIRATORY_TRACT | Status: DC
  Administered 2024-03-27 – 2024-03-31 (×4): 1 via RESPIRATORY_TRACT
  Filled 2024-03-25 (×2): qty 28

## 2024-03-25 MED ORDER — DULOXETINE HCL 30 MG PO CPEP
30.0000 mg | ORAL_CAPSULE | Freq: Every day | ORAL | Status: DC
  Administered 2024-03-26 – 2024-03-31 (×6): 30 mg via ORAL
  Filled 2024-03-25 (×6): qty 1

## 2024-03-25 MED ORDER — ASPIRIN 81 MG PO CHEW
81.0000 mg | CHEWABLE_TABLET | Freq: Every day | ORAL | Status: DC
  Administered 2024-03-26 – 2024-03-31 (×6): 81 mg via ORAL
  Filled 2024-03-25 (×6): qty 1

## 2024-03-25 MED ORDER — ONDANSETRON HCL 4 MG/2ML IJ SOLN
4.0000 mg | Freq: Once | INTRAMUSCULAR | Status: AC
  Administered 2024-03-25: 4 mg via INTRAVENOUS
  Filled 2024-03-25: qty 2

## 2024-03-25 MED ORDER — ALBUTEROL SULFATE (2.5 MG/3ML) 0.083% IN NEBU: 3.0000 mL | INHALATION_SOLUTION | Freq: Four times a day (QID) | RESPIRATORY_TRACT | Status: DC | PRN

## 2024-03-25 MED ORDER — PANTOPRAZOLE SODIUM 40 MG PO TBEC
40.0000 mg | DELAYED_RELEASE_TABLET | Freq: Every day | ORAL | Status: DC
  Administered 2024-03-26 – 2024-03-31 (×6): 40 mg via ORAL
  Filled 2024-03-25 (×6): qty 1

## 2024-03-25 MED ORDER — HEPARIN BOLUS VIA INFUSION
3100.0000 [IU] | Freq: Once | INTRAVENOUS | Status: AC
  Administered 2024-03-25: 3100 [IU] via INTRAVENOUS
  Filled 2024-03-25: qty 3100

## 2024-03-25 MED ORDER — MIDODRINE HCL 5 MG PO TABS
5.0000 mg | ORAL_TABLET | ORAL | Status: DC | PRN
  Administered 2024-03-30: 5 mg via ORAL

## 2024-03-25 MED ORDER — LOPERAMIDE HCL 2 MG PO CAPS: 2.0000 mg | ORAL_CAPSULE | Freq: Four times a day (QID) | ORAL | Status: DC | PRN

## 2024-03-25 MED ORDER — ACETAMINOPHEN 325 MG PO TABS
650.0000 mg | ORAL_TABLET | Freq: Four times a day (QID) | ORAL | Status: AC | PRN
  Administered 2024-03-27: 650 mg via ORAL

## 2024-03-25 MED ORDER — ASPIRIN 81 MG PO CHEW
324.0000 mg | CHEWABLE_TABLET | Freq: Once | ORAL | Status: AC
  Administered 2024-03-25: 324 mg via ORAL
  Filled 2024-03-25: qty 4

## 2024-03-25 MED ORDER — OXYCODONE HCL 5 MG PO TABS
5.0000 mg | ORAL_TABLET | Freq: Once | ORAL | Status: AC
  Administered 2024-03-25: 5 mg via ORAL
  Filled 2024-03-25: qty 1

## 2024-03-25 MED ORDER — OXYCODONE HCL 5 MG PO TABS
5.0000 mg | ORAL_TABLET | Freq: Four times a day (QID) | ORAL | Status: DC | PRN
  Administered 2024-03-26 – 2024-03-31 (×11): 5 mg via ORAL
  Filled 2024-03-25 (×12): qty 1

## 2024-03-25 MED ORDER — HYDRALAZINE HCL 20 MG/ML IJ SOLN: 5.0000 mg | Freq: Four times a day (QID) | INTRAMUSCULAR | Status: AC | PRN

## 2024-03-25 MED ORDER — TOPIRAMATE 25 MG PO TABS: 50.0000 mg | ORAL_TABLET | Freq: Two times a day (BID) | ORAL | Status: DC | PRN

## 2024-03-25 MED ORDER — ACETAMINOPHEN 650 MG RE SUPP
650.0000 mg | Freq: Four times a day (QID) | RECTAL | Status: AC | PRN
Start: 2024-03-25 — End: 2024-03-30

## 2024-03-25 MED ORDER — TRAZODONE HCL 50 MG PO TABS
75.0000 mg | ORAL_TABLET | Freq: Every day | ORAL | Status: DC
  Administered 2024-03-25 – 2024-03-30 (×6): 75 mg via ORAL
  Filled 2024-03-25 (×6): qty 2

## 2024-03-25 NOTE — Assessment & Plan Note (Addendum)
 With syncope Continue heparin  per pharmacy Complete echo ordered Cardiology has been consulted via staff message and epic order PE cannot be excluded at this time.  CTA PE cannot be ordered as patient has end-stage renal disease on hemodialysis VQ scan ordered Admit to progressive, inpatient

## 2024-03-25 NOTE — H&P (Addendum)
 History and Physical   Katie [name] FMW:[id number] DOB: [birth year] DOA: 03/25/2024  PCP: Cordella Corning, FNP  Patient coming from: Dialysis center  I have personally briefly reviewed patient's old medical records in [facility] EMR.  Chief Concern: Syncope  HPI: Katie [name] is a [age]-year-old female with history of depression, anxiety, GERD, ESRD on hemodialysis, history of COPD, hypertension, who presents to the ED for chief concerns of syncope.  Patient was at dialysis, and had a break to go use the restroom.  When she got up, she had a syncopal event.  EMS was called and patient was transported to the emergency department for further evaluation.  Vitals in the ED showed t of 97.8, rr of 18, hr 96 on repeat is 104, blood pressure 114/57, SpO2 97% on room air.  Serum sodium is 141, potassium 3.9, chloride 100, bicarb 30, BUN of 30, serum creatinine of 2.13, eGFR 25, nonfasting blood glucose 76, WBC 9.0, hemoglobin 12.4, platelets of 103.  HS troponin is 289.  AST is 50, ALT is 60.  ED treatment: Aspirin  324 mg p.o. one-time dose, ondansetron  4 mg IV one-time dose, oxycodone  5 mg p.o. one-time dose, heparin  bolus and gtt. ------------------------------------ At bedside, patient was able to tell me her first and last name, age, location, current calendar year.  She reports that she needed to go use the restroom during dialysis and when she got up she had a syncopal event.  She thinks she may have 1 more hour of dialysis left.  She does not know how long she was out for.  She denies chest pain, shortness of breath.  She endorses some congestion that has been ongoing for several days.  She reports she got a flu shot yesterday.  She does not know if she has had a COVID test or COVID-vaccine.  She denies other known trauma to her person.  She denies known sick contacts.  She denies current nausea or vomiting though sometimes she can have nausea with dialysis.  Social history:  Patient lives at [facility].  She denies tobacco, EtOH, recreational drug use. She is retired.  ROS: Constitutional: no weight change, no fever ENT/Mouth: no sore throat, no rhinorrhea Eyes: no eye pain, no vision changes Cardiovascular: no chest pain, no dyspnea,  no edema, no palpitations Respiratory: no cough, no sputum, no wheezing Gastrointestinal: no nausea, no vomiting, no diarrhea, no constipation Genitourinary: no urinary incontinence, no dysuria, no hematuria Musculoskeletal: no arthralgias, no myalgias Skin: no skin lesions, no pruritus, Neuro: + weakness, no loss of consciousness, + syncope Psych: no anxiety, no depression, + decrease appetite Heme/Lymph: no bruising, no bleeding  ED Course: Discussed with EDP, patient requiring hospitalization for chief concerns of NSTEMI.  Assessment/Plan  Principal Problem:   NSTEMI (non-ST elevated myocardial infarction) (HCC) Active Problems:   Hypertension   Type 2 diabetes mellitus (HCC)   ESRD (end stage renal disease) on dialysis (HCC)   Adjustment disorder with mixed anxiety and depressed mood   Chronic pain syndrome   Assessment and Plan:  * NSTEMI (non-ST elevated myocardial infarction) (HCC) With syncope Continue heparin  per pharmacy Complete echo ordered Cardiology has been consulted via staff message and epic order PE cannot be excluded at this time.  CTA PE cannot be ordered as patient has end-stage renal disease on hemodialysis VQ scan ordered Admit to progressive, inpatient  ESRD (end stage renal disease) on dialysis [facility]) Nephrology has been consulted  Type 2 diabetes mellitus (HCC) Currently  not on any glycemic agents And blood glucose is not elevated  Hypertension Hydralazine  5 mg IV every 6 hours as needed for SBP greater 170, 5 days ordered  Chronic pain syndrome PDMP reviewed Home oxycodone  resumed  Adjustment disorder with mixed anxiety and depressed mood Home duloxetine   30 mg daily, trazodone  75 mg nightly resumed  Chart reviewed.   DVT prophylaxis: Heparin  per pharmacy Code Status: DNR/DNI Diet: Renal Family Communication: No Disposition Plan: Pending clinical course, pending cardiology evaluation Consults called: Cardiology, pharmacy Admission status: PCU, inpatient  Past Medical History:  Diagnosis Date   Chronic diastolic heart failure (HCC)    COPD (chronic obstructive pulmonary disease) (HCC)    Diabetes mellitus without complication (HCC)    ESRD on dialysis (HCC)    Hypertension    Severe aortic stenosis    Past Surgical History:  Procedure Laterality Date   A/V SHUNT INTERVENTION Left 12/21/2021   Procedure: A/V SHUNT INTERVENTION;  Surgeon: Marea Selinda RAMAN, MD;  Location: [facility]IVE [facility];  Service: Cardiovascular;  Laterality: Left;   APPENDECTOMY     AV FISTULA PLACEMENT Left 03/16/2021   Procedure: INSERTION OF ARTERIOVENOUS (AV) GORE-TEX GRAFT ARM;  Surgeon: Marea Selinda RAMAN, MD;  Location: [facility] ORS;  Service: Vascular;  Laterality: Left;   DIALYSIS/PERMA CATHETER INSERTION N/A 05/16/2020   Procedure: DIALYSIS/PERMA CATHETER INSERTION;  Surgeon: Marea Selinda RAMAN, MD;  Location: [facility];  Service: Cardiovascular;  Laterality: N/A;   DIALYSIS/PERMA CATHETER REMOVAL N/A 03/26/2022   Procedure: DIALYSIS/PERMA CATHETER REMOVAL;  Surgeon: Marea Selinda RAMAN, MD;  Location: [facility];  Service: Cardiovascular;  Laterality: N/A;   DIALYSIS/PERMA CATHETER REPAIR N/A 01/22/2022   Procedure: DIALYSIS/PERMA CATHETER INSERT;  Surgeon: Marea Selinda RAMAN, MD;  Location: [facility];  Service: Cardiovascular;  Laterality: N/A;   MANDIBLE SURGERY     RENAL BIOPSY Right    RIGHT HEART CATH N/A 05/10/2020   Procedure: RIGHT HEART CATH;  Surgeon: Mady Bruckner, MD;  Location: [facility];  Service: Cardiovascular;  Laterality: N/A;   RIGHT/LEFT HEART CATH AND CORONARY ANGIOGRAPHY Bilateral 08/15/2023   Procedure: RIGHT/LEFT HEART  CATH AND CORONARY ANGIOGRAPHY;  Surgeon: Anner Alm ORN, MD;  Location: [facility];  Service: Cardiovascular;  Laterality: Bilateral;   TEMPORARY DIALYSIS CATHETER N/A 05/11/2020   Procedure: TEMPORARY DIALYSIS CATHETER;  Surgeon: Marea Selinda RAMAN, MD;  Location: [facility];  Service: Cardiovascular;  Laterality: N/A;   Social History:  reports that she has quit smoking. Her smoking use included cigarettes. She has never used smokeless tobacco. She reports that she does not currently use alcohol . She reports that she does not use drugs.  Allergies  Allergen Reactions   Penicillins Nausea And Vomiting    Pt states only pills make her vomit Other reaction(s): Nausea/Vomit   Family History  Problem Relation Age of Onset   Heart attack Mother    Asthma Mother    Uterine cancer Mother    Skin cancer Father    Prostate cancer Father    CAD Father    Parkinson's disease Father    Lung cancer Brother    Family history: Family history reviewed and not pertinent.  Prior to Admission medications   Medication Sig Start Date End Date Taking? Authorizing Provider  acetaminophen  (TYLENOL ) 500 MG tablet Take 500 mg by mouth 3 (three) times daily. Take additional 500 mg every 6 hours as needed for pain    [provider]  albuterol  (VENTOLIN  HFA) 108 (90 Base) MCG/ACT inhaler Inhale 1 puff into the lungs every 6 (six) hours as needed for wheezing. 01/29/22   [provider]  aspirin  81 MG chewable tablet Chew 81 mg by mouth daily. 01/14/24   [provider]  DULoxetine  (CYMBALTA ) 30 MG capsule Take 30 mg by mouth daily. 08/06/23   [provider]  famotidine  (PEPCID ) 20 MG tablet Take 10 mg by mouth at bedtime.    [provider]  fluticasone -salmeterol (ADVAIR HFA) 230-21 MCG/ACT inhaler Inhale 2 puffs into the lungs 2 (two) times daily.    [provider]  loperamide  (IMODIUM  A-D) 2 MG tablet Take 2 mg by mouth 4 (four) times  daily as needed for diarrhea or loose stools.    [provider]  melatonin 3 MG TABS tablet Take 3 mg by mouth at bedtime.    [provider]  midodrine  (PROAMATINE ) 5 MG tablet Take 5 mg by mouth. Monday, Wednesday, Friday prior to dialysis 05/06/23   [provider]  ondansetron  (ZOFRAN ) 4 MG tablet Take 4 mg by mouth every Monday, Wednesday, and Friday. 04/27/23   [provider]  oxycodone  (OXY-IR) 5 MG capsule Take 5 mg by mouth every 6 (six) hours as needed. 01/14/24   [provider]  pantoprazole  (PROTONIX ) 40 MG tablet Take 40 mg by mouth daily. 01/14/24   [provider]  pramoxine (SARNA SENSITIVE) 1 % LOTN Apply 1 Application topically 2 (two) times daily.    [provider]  tiZANidine  (ZANAFLEX ) 2 MG tablet Take 2 mg by mouth at bedtime.    [provider]  topiramate  (TOPAMAX ) 50 MG tablet Take 1 tablet (50 mg total) by mouth 2 (two) times daily as needed (headache). 03/21/21   Jhonny Calvin NOVAK, MD  traZODone  (DESYREL ) 50 MG tablet Take 75 mg by mouth at bedtime. 01/14/24   [provider]   Physical Exam: Vitals:   03/25/24 1501 03/25/24 1710 03/25/24 1808  BP: (!) 114/57  (!) 144/69  Pulse: 96  100  Resp: 18  20  Temp: 97.8 F (36.6 C)  99 F (37.2 C)  TempSrc: Oral  Oral  SpO2: 97%  95%  Weight:  51.8 kg   Height:  5' (1.524 m)    Constitutional: appears chronological age, NAD, calm Eyes: PERRL, lids and conjunctivae normal ENMT: Mucous membranes are moist. Posterior pharynx clear of any exudate or lesions. Age-appropriate dentition. Hearing appropriate Neck: normal, supple, no masses, no thyromegaly Respiratory: clear to auscultation bilaterally, no wheezing, no crackles. Normal respiratory effort. No accessory muscle use.  Cardiovascular: Regular rate and rhythm, no murmurs / rubs / gallops. No extremity edema. 2+ pedal pulses. No carotid bruits.  Abdomen: no tenderness, no masses  palpated, no hepatosplenomegaly. Bowel sounds positive.  Musculoskeletal: no clubbing / cyanosis. No joint deformity upper and lower extremities. Good ROM, no contractures, no atrophy. Normal muscle tone.  Skin: Extensive facial hair with female pattern facial hair, no rashes, lesions, ulcers. No induration Neurologic: Sensation intact. Strength 5/5 in all 4.  Psychiatric: Normal judgment and insight. Alert and oriented x 3. Normal mood.   EKG: independently reviewed, showing sinus rhythm with rate of 91, QTc 437  X-ray on Admission: I personally reviewed and I agree with radiologist reading as below.  DG Pelvis 1-2 Views Result Date: 03/25/2024 EXAM: 1 OR 2 VIEW(S) XRAY OF THE PELVIS 03/25/2024 05:19:00 PM COMPARISON: None available. CLINICAL HISTORY: fall, pain FINDINGS: BONES AND JOINTS:  Significant soft tissue artifact overlying the lower pelvis. No definite pubic rami fractures. The pubic symphysis and si joints are intact. No definite hip fractures. Advanced vascular disease. Degenerative changes of the lower lumbar spine. No focal osseous lesion. SOFT TISSUES: Vascular calcifications. IMPRESSION: 1. No acute hip or pelvic fractures 2. Atherosclerotic vascular calcifications. Electronically signed by: Maude Stammer MD 03/25/2024 06:07 PM EST RP Workstation: [id number]   CT HEAD WO CONTRAST ( ) Result Date: 03/25/2024 CLINICAL DATA:  Head and neck trauma fell in bathroom EXAM: CT HEAD WITHOUT CONTRAST CT CERVICAL SPINE WITHOUT CONTRAST TECHNIQUE: Multidetector CT imaging of the head and cervical spine was performed following the standard protocol without intravenous contrast. Multiplanar CT image reconstructions of the cervical spine were also generated. RADIATION DOSE REDUCTION: This exam was performed according to the departmental dose-optimization program which includes automated exposure control, adjustment of the mA and/or kV according to patient size and/or use of iterative  reconstruction technique. COMPARISON:  CT brain 02/18/2024, MRI 02/01/2024, CT brain and cervical spine 01/14/2024 FINDINGS: CT HEAD FINDINGS Brain: No acute territorial infarction, hemorrhage or intracranial mass. Minimal encephalomalacia at the right temporal occipital junction at the site of prior hemorrhage. Atrophy and mild chronic small vessel ischemic changes of the white matter. Nonenlarged ventricles Vascular: No hyperdense vessels.  Carotid vascular calcification Skull: Normal. Negative for fracture or focal lesion. Sinuses/Orbits: No acute finding. Other: Small right forehead scalp hematoma CT CERVICAL SPINE FINDINGS Alignment: Facet alignment within normal limits. Mild reversal of cervical lordosis. Trace retrolisthesis C6 on C7. Skull base and vertebrae: No acute fracture. No primary bone lesion or focal pathologic process. Soft tissues and spinal canal: No prevertebral fluid or swelling. No visible canal hematoma. Disc levels: Multilevel degenerative change. Advanced disc space narrowing C5-C6 and C6-C7. At least mild canal stenosis C5-C6 secondary to posterior disc osteophyte complex. Upper chest: No acute finding. Ten year right thyroid calcification, no specific imaging follow-up recommended. Other: Carotid vascular calcification IMPRESSION: 1. No CT evidence for acute intracranial abnormality. Atrophy and chronic small vessel ischemic changes of the white matter. Small right forehead scalp hematoma. 2. Reversal of cervical lordosis with degenerative changes. No acute osseous abnormality. Electronically Signed   By: Luke Bun M.D.   On: 03/25/2024 16:54   CT Cervical Spine Wo Contrast Result Date: 03/25/2024 CLINICAL DATA:  Head and neck trauma fell in bathroom EXAM: CT HEAD WITHOUT CONTRAST CT CERVICAL SPINE WITHOUT CONTRAST TECHNIQUE: Multidetector CT imaging of the head and cervical spine was performed following the standard protocol without intravenous contrast. Multiplanar CT image  reconstructions of the cervical spine were also generated. RADIATION DOSE REDUCTION: This exam was performed according to the departmental dose-optimization program which includes automated exposure control, adjustment of the mA and/or kV according to patient size and/or use of iterative reconstruction technique. COMPARISON:  CT brain 02/18/2024, MRI 02/01/2024, CT brain and cervical spine 01/14/2024 FINDINGS: CT HEAD FINDINGS Brain: No acute territorial infarction, hemorrhage or intracranial mass. Minimal encephalomalacia at the right temporal occipital junction at the site of prior hemorrhage. Atrophy and mild chronic small vessel ischemic changes of the white matter. Nonenlarged ventricles Vascular: No hyperdense vessels.  Carotid vascular calcification Skull: Normal. Negative for fracture or focal lesion. Sinuses/Orbits: No acute finding. Other: Small right forehead scalp hematoma CT CERVICAL SPINE FINDINGS Alignment: Facet alignment within normal limits. Mild reversal of cervical lordosis. Trace retrolisthesis C6 on C7. Skull base and vertebrae: No acute fracture. No primary bone lesion or focal pathologic process. Soft tissues and spinal  canal: No prevertebral fluid or swelling. No visible canal hematoma. Disc levels: Multilevel degenerative change. Advanced disc space narrowing C5-C6 and C6-C7. At least mild canal stenosis C5-C6 secondary to posterior disc osteophyte complex. Upper chest: No acute finding. Ten year right thyroid calcification, no specific imaging follow-up recommended. Other: Carotid vascular calcification IMPRESSION: 1. No CT evidence for acute intracranial abnormality. Atrophy and chronic small vessel ischemic changes of the white matter. Small right forehead scalp hematoma. 2. Reversal of cervical lordosis with degenerative changes. No acute osseous abnormality. Electronically Signed   By: Luke Bun M.D.   On: 03/25/2024 16:54   Labs on Admission: I have personally reviewed following  labs  CBC: Recent Labs  Lab 03/25/24 1504  WBC 9.0  HGB 12.4  HCT 39.7  MCV 106.7*  PLT 103*   Basic Metabolic Panel: Recent Labs  Lab 03/25/24 1504  NA 141  K 3.9  CL 100  CO2 30  GLUCOSE 76  BUN 30*  CREATININE 2.13*  CALCIUM  9.1   GFR: Estimated Creatinine Clearance: 18.9 mL/min (A) (by C-G formula based on SCr of 2.13 mg/dL (H)).  Liver Function Tests: Recent Labs  Lab 03/25/24 1504  AST 50*  ALT 60*  ALKPHOS 121  BILITOT 0.6  PROT 6.6  ALBUMIN  3.3*   Urine analysis:    Component Value Date/Time   COLORURINE YELLOW (A) 01/31/2024 1206   APPEARANCEUR CLEAR (A) 01/31/2024 1206   LABSPEC 1.014 01/31/2024 1206   PHURINE 6.0 01/31/2024 1206   GLUCOSEU NEGATIVE 01/31/2024 1206   HGBUR NEGATIVE 01/31/2024 1206   BILIRUBINUR NEGATIVE 01/31/2024 1206   KETONESUR NEGATIVE 01/31/2024 1206   PROTEINUR 100 (A) 01/31/2024 1206   NITRITE NEGATIVE 01/31/2024 1206   LEUKOCYTESUR SMALL (A) 01/31/2024 1206   This document was prepared using Dragon Voice Recognition software and may include unintentional dictation errors.  Dr. Sherre [facility]  If 7PM-7AM, please contact overnight-coverage provider If 7AM-7PM, please contact day attending provider [email or web address]  03/25/2024, 6:48 PM

## 2024-03-25 NOTE — Assessment & Plan Note (Signed)
Hydralazine 5 mg IV every 6 hours as needed for SBP greater 170, 5 days ordered

## 2024-03-25 NOTE — ED Triage Notes (Signed)
 Patient states she was in the middle of dialysis and had a syncopal episode in the bathroom; denies thinners.

## 2024-03-25 NOTE — Assessment & Plan Note (Signed)
 Currently not on any glycemic agents And blood glucose is not elevated

## 2024-03-25 NOTE — Hospital Course (Addendum)
 [name] is a [age]-year-old female with history of depression, anxiety, GERD, ESRD on hemodialysis, history of COPD, hypertension, who presents to the ED for chief concerns of syncope.  Patient was at dialysis, and had a break to go use the restroom.  When she got up, she had a syncopal event.  EMS was called and patient was transported to the emergency department for further evaluation.  Vitals in the ED showed t of 97.8, rr of 18, hr 96 on repeat is 104, blood pressure 114/57, SpO2 97% on room air.  Serum sodium is 141, potassium 3.9, chloride 100, bicarb 30, BUN of 30, serum creatinine of 2.13, eGFR 25, nonfasting blood glucose 76, WBC 9.0, hemoglobin 12.4, platelets of 103.  HS troponin is 289.  AST is 50, ALT is 60.  ED treatment: Aspirin  324 mg p.o. one-time dose, ondansetron  4 mg IV one-time dose, oxycodone  5 mg p.o. one-time dose, heparin  bolus and gtt.

## 2024-03-25 NOTE — Assessment & Plan Note (Addendum)
 PDMP reviewed Home oxycodone  resumed

## 2024-03-25 NOTE — Assessment & Plan Note (Signed)
 Home duloxetine  30 mg daily, trazodone  75 mg nightly resumed

## 2024-03-25 NOTE — Assessment & Plan Note (Signed)
-   Nephrology has been consulted

## 2024-03-25 NOTE — ED Triage Notes (Signed)
 Patient arrived by [facility] from dialysis. Finished 50% dialysis. Staff paused treatment to go to restroom and patient fell in bathroom, hitting head. Hematoma on head and c/o neck pain. Skin tear on right elbow and left lower leg.  Arrived in C collar  EMS vitals: 115/61 b/p 70HR 112CBG 92%  History COPD

## 2024-03-25 NOTE — ED Provider Notes (Signed)
 [facility] Provider Note    Event Date/Time   First MD Initiated Contact with Patient 03/25/24 1553     (approximate)   History   Loss of Consciousness   HPI  Katie [name] is a 68 y.o. female who presents to the emergency department today because of concern for a syncopal episode.  Patient was at dialysis.  She stopped her dialysis to go to the bathroom.  When she got up she passed out.  The patient denies any chest pain or shortness of breath.  She did hit her head although complains primarily of pain around her buttocks.  The patient denies any recent illness.     Physical Exam   Triage Vital Signs: ED Triage Vitals [03/25/24 1501]  Encounter Vitals Group     BP (!) 114/57     Girls Systolic BP Percentile      Girls Diastolic BP Percentile      Boys Systolic BP Percentile      Boys Diastolic BP Percentile      Pulse Rate 96     Resp 18     Temp 97.8 F (36.6 C)     Temp Source Oral     SpO2 97 %     Weight      Height      Head Circumference      Peak Flow      Pain Score 6     Pain Loc      Pain Education      Exclude from Growth Chart     Most recent vital signs: Vitals:   03/25/24 1501  BP: (!) 114/57  Pulse: 96  Resp: 18  Temp: 97.8 F (36.6 C)  SpO2: 97%   General: Awake, alert, oriented. CV:  Good peripheral perfusion. Regular rate and rhythm. Resp:  Normal effort. Lungs clear. Abd:  No distention.    ED Results / Procedures / Treatments   Labs (all labs ordered are listed, but only abnormal results are displayed) Labs Reviewed  COMPREHENSIVE METABOLIC PANEL WITH GFR - Abnormal; Notable for the following components:      Result Value   BUN 30 (*)    Creatinine, Ser 2.13 (*)    Albumin  3.3 (*)    AST 50 (*)    ALT 60 (*)    GFR, Estimated 25 (*)    All other components within normal limits  CBC - Abnormal; Notable for the following components:   RBC 3.72 (*)    MCV 106.7 (*)    Platelets 103 (*)    All  other components within normal limits  TROPONIN T, HIGH SENSITIVITY - Abnormal; Notable for the following components:   Troponin T High Sensitivity 289 (*)    All other components within normal limits  RESP PANEL BY RT-PCR (RSV, FLU A&B, COVID)  [id number]  PROTIME-INR  APTT  URINALYSIS, ROUTINE W REFLEX MICROSCOPIC  CBC  HEPARIN  LEVEL (UNFRACTIONATED)  BASIC METABOLIC PANEL WITH GFR  CBG MONITORING, ED     EKG  I, Guadalupe Eagles, attending physician, personally viewed and interpreted this EKG  EKG Time: 1510 Rate: 91 Rhythm: normal sinus rhythm Axis: left axis deviation Intervals: qtc 437 QRS: narrow ST changes: no st elevation Impression: abnormal ekg   RADIOLOGY I independently interpreted and visualized the CT head/cervical spine. My interpretation: No ICH. No fracture Radiology interpretation:  IMPRESSION:  1. No CT evidence for acute intracranial abnormality. Atrophy and  chronic small vessel  ischemic changes of the white matter. Small  right forehead scalp hematoma.  2. Reversal of cervical lordosis with degenerative changes. No acute  osseous abnormality.     PROCEDURES:  Critical Care performed: Yes  CRITICAL CARE Performed by: Guadalupe Eagles   Total critical care time: 35 minutes  Critical care time was exclusive of separately billable procedures and treating other patients.  Critical care was necessary to treat or prevent imminent or life-threatening deterioration.  Critical care was time spent personally by me on the following activities: development of treatment plan with patient and/or surrogate as well as nursing, discussions with consultants, evaluation of patient's response to treatment, examination of patient, obtaining history from patient or surrogate, ordering and performing treatments and interventions, ordering and review of laboratory studies, ordering and review of radiographic studies, pulse oximetry and re-evaluation of patient's  condition.   Procedures    MEDICATIONS ORDERED IN ED: Medications - No data to display   IMPRESSION / MDM / ASSESSMENT AND PLAN / ED COURSE  I reviewed the triage vital signs and the nursing notes.                              Differential diagnosis includes, but is not limited to, anemia, dehydration, vasovagal, ACS  Patient's presentation is most consistent with acute presentation with potential threat to life or bodily function.   The patient is on the cardiac monitor to evaluate for evidence of arrhythmia and/or significant heart rate changes.  Patient presented to the emergency department today because of concerns for a syncopal episode while at dialysis.  At time of exam patient is awake alert and oriented.  EKG without concerning arrhythmia.  Blood work was notable for significant elevation of her troponin.  While she has had slight elevation in the past this is a significant change.  Discussed this with the patient.  Will give aspirin  and start heparin .  Discussed with Dr. Sherre with the hospitalist service for admission.      FINAL CLINICAL IMPRESSION(S) / ED DIAGNOSES   Final diagnoses:  Syncope, unspecified syncope type  Elevated troponin      Note:  This document was prepared using Dragon voice recognition software and may include unintentional dictation errors.    Eagles Guadalupe, MD 03/25/24 [phone]

## 2024-03-25 NOTE — Consult Note (Signed)
 Pharmacy Consult Note - Anticoagulation  Pharmacy Consult for Heparin  drip Indication: chest pain/ACS Allergies  Allergen Reactions   Penicillins Nausea And Vomiting    Pt states only pills make her vomit Other reaction(s): Nausea/Vomit    PATIENT MEASUREMENTS: Height: 5' (152.4 cm) Weight: 51.8 kg (114 lb 4.8 oz) IBW/kg (Calculated) : 45.5 HEPARIN  DW (KG): 51.8  VITAL SIGNS: Temp: 97.8 F (36.6 C) (11/12 1501) Temp Source: Oral (11/12 1501) BP: 114/57 (11/12 1501) Pulse Rate: 96 (11/12 1501)  Recent Labs    03/25/24 1504  HGB 12.4  HCT 39.7  PLT 103*  CREATININE 2.13*    Estimated Creatinine Clearance: 18.9 mL/min (A) (by C-G formula based on SCr of 2.13 mg/dL (H)).  PAST MEDICAL HISTORY: Past Medical History:  Diagnosis Date   Chronic diastolic heart failure (HCC)    COPD (chronic obstructive pulmonary disease) (HCC)    Diabetes mellitus without complication (HCC)    ESRD on dialysis (HCC)    Hypertension    Severe aortic stenosis     Medications:  (Not in a hospital admission)  Scheduled:   heparin   3,100 Units Intravenous Once   Infusions:   heparin      PRN:   ASSESSMENT: 68 y.o. female with presenting with syncope and hitting head at HD session today. Patient is not on chronic anticoagulation per chart review. Pharmacy has been consulted to initiate and manage heparin  intravenous infusion.   Goal(s) of therapy: Heparin  level 0.3 - 0.7 units/mL aPTT 66 - 102 seconds Monitor platelets by anticoagulation protocol: Yes   Baseline anticoagulation labs: Recent Labs    03/25/24 1504  HGB 12.4  PLT 103*       PLAN:  Give 3100 units bolus x1; then start heparin  infusion at 600 units/hour.  Check heparin  level in 8 hours, then daily once at least two levels are consecutively therapeutic.  Monitor CBC daily while on heparin  infusion.   Katie [name], PharmD Clinical Pharmacist 03/25/2024 5:25 PM

## 2024-03-25 NOTE — Progress Notes (Signed)
 Reviewed chart with consult for elevated hsT in evaluation for syncope.   70F with severe AS, ESRD on iHD, COPD, HTN, GERD, MDD, and anxiety who presents for evaluation of syncopal event after she got up from dialysis to use the restroom.  Evaluation in the ED with stable vital signs and labs notable for a troponin elevation of 289.  She was given aspirin  324 mg p.o. once along with Zofran  and, oxycodone  and heparin .  No anginal equivalents reported.   She was seen by Dr. Verlin on 02/10/2024 after she had been scheduled for TAVR for severe AS however this was canceled after she was hospitalized for mechanical fall.  With extremely poor functional status, multiple comorbidities and recent subdural hematoma along with intraparenchymal hematoma after mechanical fall she was not thought to be a TAVR candidate at her last appointment.   Review of recent studies:   ECG (03/25/24, 15:10:50): NSR 91, PR 184, QR 86, QT/c 356/437, no acute ischemic changes   TTE Result date: 01/14/24  1. Left ventricular ejection fraction, by estimation, is 60 to 65%. Left  ventricular ejection fraction by 3D volume is 52 %. The left ventricle has  normal function. There is moderate left ventricular hypertrophy. Left  ventricular diastolic parameters  are indeterminate. The average left ventricular global longitudinal strain  is -20.0 %. The global longitudinal strain is normal.   2. Right ventricular systolic function is normal. The right ventricular  size is normal. Mildly increased right ventricular wall thickness. There  is severely elevated pulmonary artery systolic pressure. The estimated  right ventricular systolic pressure  is 69.5 mmHg.   3. Trivial mitral valve regurgitation. Moderate mitral stenosis;  transvalvular gradient likely accentuated by high-flow state. The mean  mitral valve gradient is 10.0 mmHg.   4. Tricuspid valve regurgitation is mild to moderate.   5. The aortic valve has an  indeterminant number of cusps. There is  moderate calcification of the aortic valve. There is severe thickening of  the aortic valve. Aortic valve regurgitation is moderate. Severe aortic  valve stenosis. Aortic valve area, by  VTI measures 0.71 cm. Aortic valve mean gradient measures 47.0 mmHg.   6. The inferior vena cava is normal in size with <50% respiratory  variability, suggesting right atrial pressure of 8 mmHg.   cCTA Result date: 11/12/23 1. Tricuspid aortic valve with severe calcifications (AV calcium  score 3688) 2. Aortic annulus measures 27mm x 22mm in diameter with perimeter 74mm and area 423 mm^2. Moderate annular calcifications adjacent to left cusp, extending into LVOT. Annular measurements are suitable for delivery of 23mm Edwards Sapien 3 valve (though on the border for a 26mm valve). 3. Low coronary height to RCA (9mm). Sufficient coronary height to left main (12mm) 4.  Optimum Fluoroscopic Angle for Delivery:  RAO 9 CAU 19 5.  Coronary calcium  score 2499 (99th percentile) 6.  Dilated ascending aorta measuring 40mm 7.  Dilated main pulmonary artery measuring 31mm  Coronary angiography  Result date: 08/15/23   Very Small Caliber Diagonal Branches: 1st Diag lesion is 90% stenosed. 2nd Diag lesion is 80% stenosed.   Mid LAD lesion is 55% stenosed.   LV end diastolic pressure is normal.   There is severe aortic valve stenosis.   Anticipated discharge date to be determined.   Plan per discussion with primary cardiology team and nephrologist would be to have the patient be admitted under the hospitalist service.  She will need to have dialysis done while she is here and  have titration of medications.   Recommend Aspirin  81mg  daily for moderate CAD.  Assessment and Plan  71F with severe AS, ESRD on iHD, COPD, HTN, GERD, MDD, and anxiety who presents for evaluation of syncopal event after she got up from dialysis to use the restroom.    hsT elevation  Severe AS MV  CAD I have reviewed Mr. Ritthaler chart and prior evaluation.  She presents with syncope after getting up in the middle of dialysis to go to the restroom and had no preceding anginal equivalents.  I suspect that her troponin elevation is related to demand as opposed to an acute thrombotic event.  She was not thought to be a candidate for TAVR and her coronary disease was predominantly in small caliber vessels not amenable to PCI.  Her syncopal event may have been vagally mediated or multifactorial in the setting of fluid shifts with abrupt standing following partial dialysis along with severe AS. Unless the concern for PE is high I would discontinue heparin  with her recent subdural and intraparenchymal hemorrhage after mechanical fall 2 months ago.  Fortunately there are not any recurrent intracerebral bleeding issues today on repeat CT imaging.  TTE is already ordered.  Cardiology will see in the morning.

## 2024-03-26 ENCOUNTER — Inpatient Hospital Stay

## 2024-03-26 ENCOUNTER — Encounter: Payer: Self-pay | Admitting: Internal Medicine

## 2024-03-26 ENCOUNTER — Inpatient Hospital Stay: Admit: 2024-03-26 | Discharge: 2024-03-26 | Disposition: A | Attending: Internal Medicine

## 2024-03-26 DIAGNOSIS — N186 End stage renal disease: Secondary | ICD-10-CM

## 2024-03-26 DIAGNOSIS — Z992 Dependence on renal dialysis: Secondary | ICD-10-CM

## 2024-03-26 DIAGNOSIS — F4323 Adjustment disorder with mixed anxiety and depressed mood: Secondary | ICD-10-CM

## 2024-03-26 DIAGNOSIS — I10 Essential (primary) hypertension: Secondary | ICD-10-CM

## 2024-03-26 DIAGNOSIS — E1122 Type 2 diabetes mellitus with diabetic chronic kidney disease: Secondary | ICD-10-CM

## 2024-03-26 DIAGNOSIS — R55 Syncope and collapse: Secondary | ICD-10-CM | POA: Diagnosis not present

## 2024-03-26 DIAGNOSIS — I214 Non-ST elevation (NSTEMI) myocardial infarction: Secondary | ICD-10-CM

## 2024-03-26 DIAGNOSIS — R7989 Other specified abnormal findings of blood chemistry: Secondary | ICD-10-CM

## 2024-03-26 DIAGNOSIS — G894 Chronic pain syndrome: Secondary | ICD-10-CM | POA: Diagnosis not present

## 2024-03-26 LAB — BASIC METABOLIC PANEL WITH GFR
Anion gap: 8 (ref 5–15)
BUN: 39 mg/dL — ABNORMAL HIGH (ref 8–23)
CO2: 31 mmol/L (ref 22–32)
Calcium: 8.4 mg/dL — ABNORMAL LOW (ref 8.9–10.3)
Chloride: 100 mmol/L (ref 98–111)
Creatinine, Ser: 2.56 mg/dL — ABNORMAL HIGH (ref 0.44–1.00)
GFR, Estimated: 20 mL/min — ABNORMAL LOW (ref >60)
Glucose, Bld: 85 mg/dL (ref 70–99)
Potassium: 3.8 mmol/L (ref 3.5–5.1)
Sodium: 138 mmol/L (ref 135–145)

## 2024-03-26 LAB — CBC
HCT: 32 % — ABNORMAL LOW (ref 36.0–46.0)
Hemoglobin: 10.1 g/dL — ABNORMAL LOW (ref 12.0–15.0)
MCH: 33 pg (ref 26.0–34.0)
MCHC: 31.6 g/dL (ref 30.0–36.0)
MCV: 104.6 fL — ABNORMAL HIGH (ref 80.0–100.0)
Platelets: 104 K/uL — ABNORMAL LOW (ref 150–400)
RBC: 3.06 MIL/uL — ABNORMAL LOW (ref 3.87–5.11)
RDW: 13.7 % (ref 11.5–15.5)
WBC: 7.1 K/uL (ref 4.0–10.5)
nRBC: 0 % (ref 0.0–0.2)

## 2024-03-26 LAB — ECHOCARDIOGRAM COMPLETE
AR max vel: 0.81 cm2
AV Area VTI: 0.85 cm2
AV Area mean vel: 0.8 cm2
AV Mean grad: 36.5 mmHg
AV Peak grad: 64.6 mmHg
Ao pk vel: 4.02 m/s
Area-P 1/2: 3.42 cm2
Height: 60 in
MV VTI: 1.49 cm2
S' Lateral: 2.3 cm
Weight: 1828.8 [oz_av]

## 2024-03-26 LAB — HEPARIN LEVEL (UNFRACTIONATED): Heparin Unfractionated: <0.1 [IU]/mL — ABNORMAL LOW (ref 0.30–0.70)

## 2024-03-26 LAB — HEPATITIS B SURFACE ANTIGEN: Hepatitis B Surface Ag: NONREACTIVE

## 2024-03-26 LAB — TROPONIN T, HIGH SENSITIVITY: Troponin T High Sensitivity: 274 ng/L (ref 0–19)

## 2024-03-26 MED ORDER — HEPARIN SODIUM (PORCINE) 5000 UNIT/ML IJ SOLN
5000.0000 [IU] | Freq: Three times a day (TID) | INTRAMUSCULAR | Status: DC
  Administered 2024-03-26 – 2024-03-31 (×15): 5000 [IU] via SUBCUTANEOUS
  Filled 2024-03-26 (×15): qty 1

## 2024-03-26 MED ORDER — HEPARIN BOLUS VIA INFUSION
1600.0000 [IU] | Freq: Once | INTRAVENOUS | Status: AC
  Administered 2024-03-26: 1600 [IU] via INTRAVENOUS
  Filled 2024-03-26: qty 1600

## 2024-03-26 MED ORDER — CHLORHEXIDINE GLUCONATE CLOTH 2 % EX PADS
6.0000 | MEDICATED_PAD | Freq: Every day | CUTANEOUS | Status: DC
  Administered 2024-03-28 – 2024-03-30 (×3): 6 via TOPICAL

## 2024-03-26 MED ORDER — IOHEXOL 350 MG/ML SOLN
75.0000 mL | Freq: Once | INTRAVENOUS | Status: AC | PRN
  Administered 2024-03-26: 75 mL via INTRAVENOUS

## 2024-03-26 NOTE — ED Notes (Signed)
 Heparin  paused and IV flushed with 30cc of NS due to heparin  level blood draw due at 0200.

## 2024-03-26 NOTE — Progress Notes (Signed)
 [facility]  ROUNDING NOTE   Subjective:   Katie [name]  is a 68 y.o.  female with past medical history of ESRD with hemodialysis, diabetes mellitus, COPD, and hypertension. Patient presents to ED after suffering a fall at the dialysis center. She is admitted for Elevated troponin [R79.89] NSTEMI (non-ST elevated myocardial infarction) (HCC) [I21.4] Syncope, unspecified syncope type [R55]  Patient is known to our practice and receives outpatient dialysis treatments at [facility] on a MWF schedule. Patient had received half her treatment before being allowed to take a bathroom break. She states she remembers waking up on the floor in dialysis clinic. Patient has had multiple falls lately.   Labs unremarkable for renal patient. Troponin 289. Respiratory panel negative for flu, covid 19 and RSV. CT imaging of spine negative. Pelvis xrays negative. ECHO shows EF 60-65% with at moderate LVH and grade 1 diastolic dysfunction. CT angio chest negative for PE.  We have been consulted to manage dialysis needs.    Objective:  Vital signs in last 24 hours:  Temp:  [97.8 F (36.6 C)-99 F (37.2 C)] 98.3 F (36.8 C) (11/13 1221) Pulse Rate:  [67-100] 77 (11/13 1221) Resp:  [14-20] 14 (11/13 0807) BP: (112-166)/(50-69) 129/54 (11/13 1221) SpO2:  [95 %-99 %] 97 % (11/13 1221) Weight:  [51.8 kg] 51.8 kg (11/12 1710)  Weight change:  Filed Weights   03/25/24 1710  Weight: 51.8 kg    Intake/Output: No intake/output data recorded.   Intake/Output this shift:  Total I/O In: 660 [P.O.:660] Out: 100 [Urine:100]  Physical Exam: General: NAD  Head: Normocephalic, atraumatic. Moist oral mucosal membranes  Eyes: Anicteric  Neck: Supple  Lungs:  Clear to auscultation  Heart: Regular rate and rhythm  Abdomen:  Soft, nontender  Extremities:  No peripheral edema.  Neurologic: Awake, alert, conversant  Skin: Warm,dry, Right temporal bruising  Access: Lt upper AVF    Basic  Metabolic Panel: Recent Labs  Lab 03/25/24 1504 03/26/24 0204  NA 141 138  K 3.9 3.8  CL 100 100  CO2 30 31  GLUCOSE 76 85  BUN 30* 39*  CREATININE 2.13* 2.56*  CALCIUM  9.1 8.4*    Liver Function Tests: Recent Labs  Lab 03/25/24 1504  AST 50*  ALT 60*  ALKPHOS 121  BILITOT 0.6  PROT 6.6  ALBUMIN  3.3*   No results for input(s): LIPASE, AMYLASE in the last 168 hours. No results for input(s): AMMONIA in the last 168 hours.  CBC: Recent Labs  Lab 03/25/24 1504 03/26/24 0204  WBC 9.0 7.1  HGB 12.4 10.1*  HCT 39.7 32.0*  MCV 106.7* 104.6*  PLT 103* 104*    Cardiac Enzymes: No results for input(s): CKTOTAL, CKMB, CKMBINDEX, TROPONINI in the last 168 hours.  BNP: Invalid input(s): POCBNP  CBG: No results for input(s): GLUCAP in the last 168 hours.  Microbiology: Results for orders placed or performed during the hospital encounter of 03/25/24  Resp panel by RT-PCR (RSV, Flu A&B, Covid) Anterior Nasal Swab     Status: None   Collection Time: 03/25/24  6:53 PM   Specimen: Anterior Nasal Swab  Result Value Ref Range Status   SARS Coronavirus 2 by RT PCR NEGATIVE NEGATIVE Final    Comment: (NOTE) SARS-CoV-2 target nucleic acids are NOT DETECTED.  The SARS-CoV-2 RNA is generally detectable in upper respiratory specimens during the acute phase of infection. The lowest concentration of SARS-CoV-2 viral copies this assay can detect is 138 copies/mL. A negative result does  not preclude SARS-Cov-2 infection and should not be used as the sole basis for treatment or other patient management decisions. A negative result may occur with  improper specimen collection/handling, submission of specimen other than nasopharyngeal swab, presence of viral mutation(s) within the areas targeted by this assay, and inadequate number of viral copies(<138 copies/mL). A negative result must be combined with clinical observations, patient history, and  epidemiological information. The expected result is Negative.  Fact Sheet for Patients:  bloggercourse.com  Fact Sheet for Healthcare Providers:  seriousbroker.it  This test is no t yet approved or cleared by the [place]  FDA and  has been authorized for detection and/or diagnosis of SARS-CoV-2 by FDA under an Emergency Use Authorization (EUA). This EUA will remain  in effect (meaning this test can be used) for the duration of the COVID-19 declaration under Section 564(b)(1) of the Act, 21 U.S.C.section 360bbb-3(b)(1), unless the authorization is terminated  or revoked sooner.       Influenza A by PCR NEGATIVE NEGATIVE Final   Influenza B by PCR NEGATIVE NEGATIVE Final    Comment: (NOTE) The Xpert Xpress SARS-CoV-2/FLU/RSV plus assay is intended as an aid in the diagnosis of influenza from Nasopharyngeal swab specimens and should not be used as a sole basis for treatment. Nasal washings and aspirates are unacceptable for Xpert Xpress SARS-CoV-2/FLU/RSV testing.  Fact Sheet for Patients: bloggercourse.com  Fact Sheet for Healthcare Providers: seriousbroker.it  This test is not yet approved or cleared by the [place]  FDA and has been authorized for detection and/or diagnosis of SARS-CoV-2 by FDA under an Emergency Use Authorization (EUA). This EUA will remain in effect (meaning this test can be used) for the duration of the COVID-19 declaration under Section 564(b)(1) of the Act, 21 U.S.C. section 360bbb-3(b)(1), unless the authorization is terminated or revoked.     Resp Syncytial Virus by PCR NEGATIVE NEGATIVE Final    Comment: (NOTE) Fact Sheet for Patients: bloggercourse.com  Fact Sheet for Healthcare Providers: seriousbroker.it  This test is not yet approved or cleared by the [place]  FDA and has been  authorized for detection and/or diagnosis of SARS-CoV-2 by FDA under an Emergency Use Authorization (EUA). This EUA will remain in effect (meaning this test can be used) for the duration of the COVID-19 declaration under Section 564(b)(1) of the Act, 21 U.S.C. section 360bbb-3(b)(1), unless the authorization is terminated or revoked.  Performed at [facility], [address] [place], [place] [address]     Coagulation Studies: Recent Labs    03/25/24 1747  LABPROT 13.3  INR 1.0    Urinalysis: No results for input(s): COLORURINE, LABSPEC, PHURINE, GLUCOSEU, HGBUR, BILIRUBINUR, KETONESUR, PROTEINUR, UROBILINOGEN, NITRITE, LEUKOCYTESUR in the last 72 hours.  Invalid input(s): APPERANCEUR    Imaging: CT Angio Chest Pulmonary Embolism (PE) W or WO Contrast Result Date: 03/26/2024 CLINICAL DATA:  Recurrent syncope after getting up from hemodialysis to void. Clinical concern for pulmonary embolism. No chest symptoms. EXAM: CT ANGIOGRAPHY CHEST WITH CONTRAST TECHNIQUE: Multidetector CT imaging of the chest was performed using the standard protocol during bolus administration of intravenous contrast. Multiplanar CT image reconstructions and MIPs were obtained to evaluate the vascular anatomy. RADIATION DOSE REDUCTION: This exam was performed according to the departmental dose-optimization program which includes automated exposure control, adjustment of the mA and/or kV according to patient size and/or use of iterative reconstruction technique. CONTRAST:  75mL OMNIPAQUE  IOHEXOL  350 MG/ML SOLN COMPARISON:  01/14/2024 FINDINGS: Cardiovascular: Normally opacified pulmonary arteries with no pulmonary arterial  filling defects seen. The ascending thoracic aorta continues to measure 4.1 cm in maximum diameter. Stable enlarged heart. Stable small pericardial effusion with a maximum thickness of 11 mm. Atheromatous calcifications, including the coronary arteries and  aorta. Mediastinum/Nodes: Multiple subcentimeter bilateral thyroid nodules. These do not need imaging follow-up. Fluid and air in a dilated proximal esophagus, previously filled with air. No enlarged lymph nodes. Lungs/Pleura: Chronic left lower lobe bronchiectasis and mucous plugging with associated atelectasis. No significant change in bilateral pleural and parenchymal scarring. Mild additional pleural based scarring or atelectasis bilaterally. No pleural fluid. Upper Abdomen: Atheromatous arterial calcifications. Musculoskeletal: Thoracic and lower cervical spine degenerative changes. Review of the MIP images confirms the above findings. IMPRESSION: 1. No pulmonary emboli. 2. 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 Recommend annual imaging followup by CTA or MRA. This recommendation follows 2010 ACCF/AHA/AATS/ACR/ASA/SCA/SCAI/SIR/STS/SVM Guidelines for the Diagnosis and Management of Patients with Thoracic Aortic Disease. Circulation. 2010; 121: Z733-z630. Aortic aneurysm NOS (ICD10-I71.9) 3. Chronic left lower lobe bronchiectasis and mucous plugging with associated atelectasis. 4. Stable bilateral pleural and parenchymal scarring. 5. Stable cardiomegaly and small pericardial effusion. 6. Calcific coronary artery and aortic atherosclerosis. Aortic Atherosclerosis (ICD10-I70.0). Electronically Signed   By: Elspeth Bathe M.D.   On: 03/26/2024 11:40   ECHOCARDIOGRAM COMPLETE Result Date: 03/26/2024    ECHOCARDIOGRAM REPORT   Patient Name:   [facility] Date of Exam: 03/26/2024 Medical Rec #:  [id number]    Height:       60.0 in Accession #:    [id number]   Weight:       114.3 lb Date of Birth:  12-24-1955   BSA:          1.472 m Patient Age:    67 years     BP:           166/50 mmHg Patient Gender: F            HR:           77 bpm. Exam Location:  [facility] Procedure: 2D Echo, Cardiac Doppler and Color Doppler (Both Spectral and Color            Flow Doppler were utilized during procedure). Indications:      NSTEMI  History:         Patient has prior history of Echocardiogram examinations, most                  recent 01/14/2024. CHF, COPD, Aortic Valve Disease; Risk                  Factors:Hypertension and Diabetes. CKD.  Sonographer:     Philomena Daring Referring Phys:  [id number] AMY N COX Diagnosing Phys: Evalene Lunger MD IMPRESSIONS  1. Left ventricular ejection fraction, by estimation, is 60 to 65%. The left ventricle has normal function. The left ventricle has no regional wall motion abnormalities. There is moderate left ventricular hypertrophy. Left ventricular diastolic parameters are consistent with Grade I diastolic dysfunction (impaired relaxation).  2. Right ventricular systolic function is normal. The right ventricular size is normal. There is severely elevated pulmonary artery systolic pressure. The estimated right ventricular systolic pressure is 68.4 mmHg.  3. The mitral valve is normal in structure. Mild mitral valve regurgitation. Moderate mitral stenosis. The mean mitral valve gradient is 10.0 mmHg. Moderate mitral annular calcification.  4. Tricuspid valve regurgitation is moderate.  5. The aortic valve is normal in structure. There is severe calcifcation of the aortic valve. Aortic valve  regurgitation is mild to moderate. Severe aortic valve stenosis. Aortic valve area, by VTI measures 0.85 cm. Aortic valve mean gradient measures 36.5 mmHg. Aortic valve Vmax measures 4.02 m/s.  6. The inferior vena cava is normal in size with greater than 50% respiratory variability, suggesting right atrial pressure of 3 mmHg. FINDINGS  Left Ventricle: Left ventricular ejection fraction, by estimation, is 60 to 65%. The left ventricle has normal function. The left ventricle has no regional wall motion abnormalities. Strain was performed and the global longitudinal strain is indeterminate. The left ventricular internal cavity size was normal in size. There is moderate left ventricular hypertrophy. Left ventricular  diastolic parameters are consistent with Grade I diastolic dysfunction (impaired relaxation). Right Ventricle: The right ventricular size is normal. No increase in right ventricular wall thickness. Right ventricular systolic function is normal. There is severely elevated pulmonary artery systolic pressure. The tricuspid regurgitant velocity is 3.98 m/s, and with an assumed right atrial pressure of 5 mmHg, the estimated right ventricular systolic pressure is 68.4 mmHg. Left Atrium: Left atrial size was normal in size. Right Atrium: Right atrial size was normal in size. Pericardium: There is no evidence of pericardial effusion. Mitral Valve: The mitral valve is normal in structure. There is moderate calcification of the mitral valve leaflet(s). Moderate mitral annular calcification. Mild mitral valve regurgitation. Moderate mitral valve stenosis. MV peak gradient, 18.8 mmHg. The mean mitral valve gradient is 10.0 mmHg. Tricuspid Valve: The tricuspid valve is normal in structure. Tricuspid valve regurgitation is moderate . No evidence of tricuspid stenosis. Aortic Valve: The aortic valve is normal in structure. There is severe calcifcation of the aortic valve. Aortic valve regurgitation is mild to moderate. Severe aortic stenosis is present. Aortic valve mean gradient measures 36.5 mmHg. Aortic valve peak gradient measures 64.6 mmHg. Aortic valve area, by VTI measures 0.85 cm. Pulmonic Valve: The pulmonic valve was normal in structure. Pulmonic valve regurgitation is not visualized. No evidence of pulmonic stenosis. Aorta: The aortic root is normal in size and structure. Venous: The inferior vena cava is normal in size with greater than 50% respiratory variability, suggesting right atrial pressure of 3 mmHg. IAS/Shunts: No atrial level shunt detected by color flow Doppler. Additional Comments: 3D was performed not requiring image post processing on an independent workstation and was indeterminate.  LEFT VENTRICLE PLAX  2D LVIDd:         3.60 cm   Diastology LVIDs:         2.30 cm   LV e' medial:    5.77 cm/s LV PW:         1.20 cm   LV E/e' medial:  22.5 LV IVS:        1.50 cm   LV e' lateral:   4.90 cm/s LVOT diam:     1.80 cm   LV E/e' lateral: 26.5 LV SV:         69 LV SV Index:   47 LVOT Area:     2.54 cm  RIGHT VENTRICLE             IVC RV Basal diam:  3.40 cm     IVC diam: 1.40 cm RV Mid diam:    2.70 cm RV S prime:     10.70 cm/s TAPSE (M-mode): 2.2 cm LEFT ATRIUM             Index        RIGHT ATRIUM  Index LA diam:        3.00 cm 2.04 cm/m   RA Area:     15.60 cm LA Vol (A2C):   51.5 ml 35.00 ml/m  RA Volume:   36.40 ml  24.74 ml/m LA Vol (A4C):   42.0 ml 28.54 ml/m LA Biplane Vol: 48.4 ml 32.89 ml/m  AORTIC VALVE AV Area (Vmax):    0.81 cm AV Area (Vmean):   0.80 cm AV Area (VTI):     0.85 cm AV Vmax:           402.00 cm/s AV Vmean:          281.000 cm/s AV VTI:            0.807 m AV Peak Grad:      64.6 mmHg AV Mean Grad:      36.5 mmHg LVOT Vmax:         128.00 cm/s LVOT Vmean:        87.900 cm/s LVOT VTI:          0.270 m LVOT/AV VTI ratio: 0.33  AORTA Ao Root diam: 2.90 cm MITRAL VALVE                TRICUSPID VALVE MV Area (PHT): 3.42 cm     TR Peak grad:   63.4 mmHg MV Area VTI:   1.49 cm     TR Vmax:        398.00 cm/s MV Peak grad:  18.8 mmHg MV Mean grad:  10.0 mmHg    SHUNTS MV Vmax:       2.17 m/s     Systemic VTI:  0.27 m MV Vmean:      152.0 cm/s   Systemic Diam: 1.80 cm MV Decel Time: 222 msec MV E velocity: 130.00 cm/s MV A velocity: 206.00 cm/s MV E/A ratio:  0.63 Evalene Lunger MD Electronically signed by Evalene Lunger MD Signature Date/Time: 03/26/2024/11:39:41 AM    Final    DG Chest Port 1 View Result Date: 03/26/2024 EXAM: 1 VIEW(S) XRAY OF THE CHEST 03/26/2024 08:50:18 AM COMPARISON: None available. CLINICAL HISTORY: Dyspnea. FINDINGS: LINES, TUBES AND DEVICES: Telemetry leads overlie the chest. LUNGS AND PLEURA: Left base atelectasis or infiltrate. Tiny bilateral  effusions. No pneumothorax. HEART AND MEDIASTINUM: Cardiopericardial silhouette is enlarged. BONES AND SOFT TISSUES: No acute bony abnormality. IMPRESSION: 1. Left basilar atelectasis versus infiltrate with tiny bilateral pleural effusions. 2. Cardiomegaly. Electronically signed by: Camellia Candle MD 03/26/2024 10:35 AM EST RP Workstation: [id number]   DG Pelvis 1-2 Views Result Date: 03/25/2024 EXAM: 1 OR 2 VIEW(S) XRAY OF THE PELVIS 03/25/2024 05:19:00 PM COMPARISON: None available. CLINICAL HISTORY: fall, pain FINDINGS: BONES AND JOINTS: Significant soft tissue artifact overlying the lower pelvis. No definite pubic rami fractures. The pubic symphysis and si joints are intact. No definite hip fractures. Advanced vascular disease. Degenerative changes of the lower lumbar spine. No focal osseous lesion. SOFT TISSUES: Vascular calcifications. IMPRESSION: 1. No acute hip or pelvic fractures 2. Atherosclerotic vascular calcifications. Electronically signed by: Maude Stammer MD 03/25/2024 06:07 PM EST RP Workstation: [id number]   CT HEAD WO CONTRAST ( ) Result Date: 03/25/2024 CLINICAL DATA:  Head and neck trauma fell in bathroom EXAM: CT HEAD WITHOUT CONTRAST CT CERVICAL SPINE WITHOUT CONTRAST TECHNIQUE: Multidetector CT imaging of the head and cervical spine was performed following the standard protocol without intravenous contrast. Multiplanar CT image reconstructions of the cervical spine were also generated. RADIATION DOSE REDUCTION: This exam was performed according  to the departmental dose-optimization program which includes automated exposure control, adjustment of the mA and/or kV according to patient size and/or use of iterative reconstruction technique. COMPARISON:  CT brain 02/18/2024, MRI 02/01/2024, CT brain and cervical spine 01/14/2024 FINDINGS: CT HEAD FINDINGS Brain: No acute territorial infarction, hemorrhage or intracranial mass. Minimal encephalomalacia at the right temporal occipital  junction at the site of prior hemorrhage. Atrophy and mild chronic small vessel ischemic changes of the white matter. Nonenlarged ventricles Vascular: No hyperdense vessels.  Carotid vascular calcification Skull: Normal. Negative for fracture or focal lesion. Sinuses/Orbits: No acute finding. Other: Small right forehead scalp hematoma CT CERVICAL SPINE FINDINGS Alignment: Facet alignment within normal limits. Mild reversal of cervical lordosis. Trace retrolisthesis C6 on C7. Skull base and vertebrae: No acute fracture. No primary bone lesion or focal pathologic process. Soft tissues and spinal canal: No prevertebral fluid or swelling. No visible canal hematoma. Disc levels: Multilevel degenerative change. Advanced disc space narrowing C5-C6 and C6-C7. At least mild canal stenosis C5-C6 secondary to posterior disc osteophyte complex. Upper chest: No acute finding. Ten year right thyroid calcification, no specific imaging follow-up recommended. Other: Carotid vascular calcification IMPRESSION: 1. No CT evidence for acute intracranial abnormality. Atrophy and chronic small vessel ischemic changes of the white matter. Small right forehead scalp hematoma. 2. Reversal of cervical lordosis with degenerative changes. No acute osseous abnormality. Electronically Signed   By: Luke Bun M.D.   On: 03/25/2024 16:54   CT Cervical Spine Wo Contrast Result Date: 03/25/2024 CLINICAL DATA:  Head and neck trauma fell in bathroom EXAM: CT HEAD WITHOUT CONTRAST CT CERVICAL SPINE WITHOUT CONTRAST TECHNIQUE: Multidetector CT imaging of the head and cervical spine was performed following the standard protocol without intravenous contrast. Multiplanar CT image reconstructions of the cervical spine were also generated. RADIATION DOSE REDUCTION: This exam was performed according to the departmental dose-optimization program which includes automated exposure control, adjustment of the mA and/or kV according to patient size and/or use  of iterative reconstruction technique. COMPARISON:  CT brain 02/18/2024, MRI 02/01/2024, CT brain and cervical spine 01/14/2024 FINDINGS: CT HEAD FINDINGS Brain: No acute territorial infarction, hemorrhage or intracranial mass. Minimal encephalomalacia at the right temporal occipital junction at the site of prior hemorrhage. Atrophy and mild chronic small vessel ischemic changes of the white matter. Nonenlarged ventricles Vascular: No hyperdense vessels.  Carotid vascular calcification Skull: Normal. Negative for fracture or focal lesion. Sinuses/Orbits: No acute finding. Other: Small right forehead scalp hematoma CT CERVICAL SPINE FINDINGS Alignment: Facet alignment within normal limits. Mild reversal of cervical lordosis. Trace retrolisthesis C6 on C7. Skull base and vertebrae: No acute fracture. No primary bone lesion or focal pathologic process. Soft tissues and spinal canal: No prevertebral fluid or swelling. No visible canal hematoma. Disc levels: Multilevel degenerative change. Advanced disc space narrowing C5-C6 and C6-C7. At least mild canal stenosis C5-C6 secondary to posterior disc osteophyte complex. Upper chest: No acute finding. Ten year right thyroid calcification, no specific imaging follow-up recommended. Other: Carotid vascular calcification IMPRESSION: 1. No CT evidence for acute intracranial abnormality. Atrophy and chronic small vessel ischemic changes of the white matter. Small right forehead scalp hematoma. 2. Reversal of cervical lordosis with degenerative changes. No acute osseous abnormality. Electronically Signed   By: Luke Bun M.D.   On: 03/25/2024 16:54     Medications:     aspirin   81 mg Oral Daily   DULoxetine   30 mg Oral Daily   famotidine   10 mg Oral QHS  fluticasone  furoate-vilanterol  1 puff Inhalation Daily   heparin  injection (subcutaneous)  5,000 Units Subcutaneous Q8H   melatonin  2.5 mg Oral QHS   pantoprazole   40 mg Oral Daily   sodium chloride  flush  3 mL  Intravenous Q12H   traZODone   75 mg Oral QHS   acetaminophen  **OR** acetaminophen , albuterol , hydrALAZINE , loperamide , midodrine , ondansetron  **OR** ondansetron  (ZOFRAN ) IV, oxyCODONE , topiramate   Assessment/ Plan:  [name] is a 68 y.o.  female with past medical history of ESRD with hemodialysis, diabetes mellitus, COPD, and hypertension. Patient presents to ED after suffering a fall at the dialysis center. She is admitted for Elevated troponin [R79.89] NSTEMI (non-ST elevated myocardial infarction) (HCC) [I21.4] Syncope, unspecified syncope type [R55]  [facility] [place]/MWF/Lt AVF   End stage renal disease on hemodialysis. Received partial treatment prior to Ed arrival. Labs stable. Due to CT angio chest, patient offered additional treatment today however refused. Would prefer to wait until tomorrow. Will order dialysis for tomorrow, first shift  2. Anemia of chronic kidney disease Lab Results  Component Value Date   HGB 10.1 (L) 03/26/2024    Hgb within desired goal. No need for ESA at this time  3. Secondary Hyperparathyroidism: with outpatient labs: PTH 304, phosphorus 4.9, calcium  8.3 on 03/23/24.   Lab Results  Component Value Date   PTH 70 (H) 05/12/2020   CALCIUM  8.4 (L) 03/26/2024   CAION 1.13 (L) 08/15/2023   PHOS 5.2 (H) 02/03/2024    Calcium  and phos within optimal range.   4. 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 Most recent hemoglobin A1c is 4.5 on 01/14/24.      LOS: 1 Katie [name] 11/13/20253:00 PM

## 2024-03-26 NOTE — Consult Note (Signed)
 Pharmacy Consult Note - Anticoagulation  Pharmacy Consult for Heparin  drip Indication: chest pain/ACS Allergies  Allergen Reactions   Penicillins Nausea And Vomiting    Pt states only pills make her vomit Other reaction(s): Nausea/Vomit    PATIENT MEASUREMENTS: Height: 5' (152.4 cm) Weight: 51.8 kg (114 lb 4.8 oz) IBW/kg (Calculated) : 45.5 HEPARIN  DW (KG): 51.8  VITAL SIGNS: Temp: 99 F (37.2 C) (11/13 0131) Temp Source: Oral (11/13 0131) BP: 129/60 (11/13 0133) Pulse Rate: 81 (11/13 0133)  Recent Labs    03/25/24 1747 03/26/24 0204  HGB  --  10.1*  HCT  --  32.0*  PLT  --  104*  APTT 36  --   LABPROT 13.3  --   INR 1.0  --   CREATININE  --  2.56*    Estimated Creatinine Clearance: 15.7 mL/min (A) (by C-G formula based on SCr of 2.56 mg/dL (H)).  PAST MEDICAL HISTORY: Past Medical History:  Diagnosis Date   Chronic diastolic heart failure (HCC)    COPD (chronic obstructive pulmonary disease) (HCC)    Diabetes mellitus without complication (HCC)    ESRD on dialysis (HCC)    Hypertension    Severe aortic stenosis     Medications:  (Not in a hospital admission)  Scheduled:   aspirin   81 mg Oral Daily   DULoxetine   30 mg Oral Daily   famotidine   10 mg Oral QHS   fluticasone  furoate-vilanterol  1 puff Inhalation Daily   melatonin  2.5 mg Oral QHS   pantoprazole   40 mg Oral Daily   sodium chloride  flush  3 mL Intravenous Q12H   traZODone   75 mg Oral QHS   Infusions:   heparin  Stopped (03/26/24 0128)   PRN:   ASSESSMENT: 68 y.o. female with presenting with syncope and hitting head at HD session today. Patient is not on chronic anticoagulation per chart review. Pharmacy has been consulted to initiate and manage heparin  intravenous infusion.   Goal(s) of therapy: Heparin  level 0.3 - 0.7 units/mL aPTT 66 - 102 seconds Monitor platelets by anticoagulation protocol: Yes   Baseline anticoagulation labs: Recent Labs    03/25/24 1504 03/25/24 1747  03/26/24 0204  APTT  --  36  --   INR  --  1.0  --   HGB 12.4  --  10.1*  PLT 103*  --  104*    11/13 0204 HL < 0.1, subtherapeutic   PLAN: Give 1600 units bolus x 1 Increase heparin  infusion to 800 units/hour. Recheck heparin  level in 8 hours after rate change Monitor CBC daily while on heparin  infusion.  Rankin CANDIE Dills, PharmD, [facility] 03/26/2024 3:50 AM

## 2024-03-26 NOTE — Progress Notes (Signed)
 CRITICAL VALUE STICKER  CRITICAL VALUE: Troponin 274  RECEIVER (on-site recipient of call): Lauraine Ponto, RN   DATE & TIME NOTIFIED: [phone] 03/26/24  MESSENGER (representative from lab): MJU  MD NOTIFIED: Dr Leesa  TIME OF NOTIFICATION: 1555  RESPONSE:  Acknowledged

## 2024-03-26 NOTE — Progress Notes (Signed)
 PROGRESS NOTE    Katie [name]  FMW:[id number] DOB: 1956/04/05 DOA: 03/25/2024 PCP: Cordella Corning, FNP  Chief Complaint  Patient presents with   Loss of Consciousness    Hospital Course:  Katie [name] is a 68 year old female with depression, anxiety, GERD, ESRD on HD, COPD, hypertension, who presents to the ED with syncope at dialysis.  Patient was at dialysis when she got up to use the restroom and had syncopal event.  On arrival to ED she is mildly tachycardic to 104, vitals otherwise stable.  High-sensitivity troponin 289, AST 50, ALT 60, platelets 103.  She was given aspirin  324, started on heparin  bolus and drip.  She was admitted for further workup.  Subjective: No acute complaints this morning.  Patient reports no chest pain presently.  She is anxious to have surgery to fix her aortic valve as she understands that this is the root of her problems.   Objective: Vitals:   03/26/24 0530 03/26/24 0551 03/26/24 0630 03/26/24 0807  BP: (!) 150/69  (!) 152/58 (!) 166/50  Pulse: 73  74 79  Resp: 18   14  Temp:  98.5 F (36.9 C)  98.8 F (37.1 C)  TempSrc:  Oral  Oral  SpO2: 98%  97% 96%  Weight:      Height:       No intake or output data in the 24 hours ending 03/26/24 0811 Filed Weights   03/25/24 1710  Weight: 51.8 kg    Examination: General exam: Appears calm and comfortable, NAD  Respiratory system: No work of breathing, symmetric chest wall expansion Cardiovascular system: S1 & S2 heard, RRR.  Gastrointestinal system: Abdomen is nondistended, soft and nontender.  Neuro: Alert and oriented. No focal neurological deficits. Extremities: Symmetric, expected ROM Skin: No rashes, lesions Psychiatry: Demonstrates appropriate judgement and insight. Mood & affect appropriate for situation.   Assessment & Plan:  Principal Problem:   NSTEMI (non-ST elevated myocardial infarction) (HCC) Active Problems:   Hypertension   Type 2 diabetes mellitus (HCC)   ESRD (end stage  renal disease) on dialysis (HCC)   Adjustment disorder with mixed anxiety and depressed mood   Chronic pain syndrome    Troponin elevation - Initially thought to be NSTEMI, cardiology now believes troponin elevation is likely secondary to demand - Has had recent coronary evaluation which was found to be predominantly in small caliber vessels not amenable to PCI. - Was initially on heparin  drip, I have discontinued this morning given cardiology does not concern of NSTEMI. - Echo EF 60 to 65%, moderate LVH, no RWMA, moderate mitral stenosis, aortic valve has severe calcification with severe stenosis. - Cardiology consulted  Syncopal event - May have been vasovagal given fluid shift during dialysis, likely complicated by severe aortic stenosis - CTA without pulmonary emboli - Ultimately does need TAVR - Orthostatic vitals ordered  Severe aortic stenosis - Was planning for TAVR outpatient but due to recent fall and poor functional status this has been canceled - Given recurrent admissions and syncopal events this needs to be reevaluated on an outpatient basis.  Cardiology to discuss with valve team outpatient  History of subdural hematoma and intraparenchymal hematoma - After mechanical fall in September - Has follow-up outpatient with neurosurgery - CT head on arrival without acute intracranial abnormality - Discontinue heparin  due to concern of rebleed  ESRD - Nephrology consulted - Resume HD while admitted right additional session offered today given CTA but patient has declined.  Will dialyze tomorrow.  Type 2  diabetes - Sliding scale insulin  titrate as needed  Hypertension - Resume home meds  Chronic pain syndrome - Home dose oxycodone  resumed  Adjustment disorder with mixed anxiety and depressed mood - Resume home meds  Ascending thoracic aortic aneurysm - Seen on CTA.  4.1 cm.  Stable. - Needs annual outpatient imaging follow-up.  DVT prophylaxis: Heparin    Code  Status: Limited: Do not attempt resuscitation (DNR) -DNR-LIMITED -Do Not Intubate/DNI  Disposition: Can likely DC tomorrow after dialysis  Consultants:  Treatment Team:  Consulting Physician: Perla Evalene PARAS, MD  Procedures:    Antimicrobials:  Anti-infectives (From admission, onward)    None       Data Reviewed: I have personally reviewed following labs and imaging studies CBC: Recent Labs  Lab 03/25/24 1504 03/26/24 0204  WBC 9.0 7.1  HGB 12.4 10.1*  HCT 39.7 32.0*  MCV 106.7* 104.6*  PLT 103* 104*   Basic Metabolic Panel: Recent Labs  Lab 03/25/24 1504 03/26/24 0204  NA 141 138  K 3.9 3.8  CL 100 100  CO2 30 31  GLUCOSE 76 85  BUN 30* 39*  CREATININE 2.13* 2.56*  CALCIUM  9.1 8.4*   GFR: Estimated Creatinine Clearance: 15.7 mL/min (A) (by C-G formula based on SCr of 2.56 mg/dL (H)). Liver Function Tests: Recent Labs  Lab 03/25/24 1504  AST 50*  ALT 60*  ALKPHOS 121  BILITOT 0.6  PROT 6.6  ALBUMIN  3.3*   CBG: No results for input(s): GLUCAP in the last 168 hours.  Recent Results (from the past 240 hours)  Resp panel by RT-PCR (RSV, Flu A&B, Covid) Anterior Nasal Swab     Status: None   Collection Time: 03/25/24  6:53 PM   Specimen: Anterior Nasal Swab  Result Value Ref Range Status   SARS Coronavirus 2 by RT PCR NEGATIVE NEGATIVE Final    Comment: (NOTE) SARS-CoV-2 target nucleic acids are NOT DETECTED.  The SARS-CoV-2 RNA is generally detectable in upper respiratory specimens during the acute phase of infection. The lowest concentration of SARS-CoV-2 viral copies this assay can detect is 138 copies/mL. A negative result does not preclude SARS-Cov-2 infection and should not be used as the sole basis for treatment or other patient management decisions. A negative result may occur with  improper specimen collection/handling, submission of specimen other than nasopharyngeal swab, presence of viral mutation(s) within the areas targeted by  this assay, and inadequate number of viral copies(<138 copies/mL). A negative result must be combined with clinical observations, patient history, and epidemiological information. The expected result is Negative.  Fact Sheet for Patients:  bloggercourse.com  Fact Sheet for Healthcare Providers:  seriousbroker.it  This test is no t yet approved or cleared by the [place]  FDA and  has been authorized for detection and/or diagnosis of SARS-CoV-2 by FDA under an Emergency Use Authorization (EUA). This EUA will remain  in effect (meaning this test can be used) for the duration of the COVID-19 declaration under Section 564(b)(1) of the Act, 21 U.S.C.section 360bbb-3(b)(1), unless the authorization is terminated  or revoked sooner.       Influenza A by PCR NEGATIVE NEGATIVE Final   Influenza B by PCR NEGATIVE NEGATIVE Final    Comment: (NOTE) The Xpert Xpress SARS-CoV-2/FLU/RSV plus assay is intended as an aid in the diagnosis of influenza from Nasopharyngeal swab specimens and should not be used as a sole basis for treatment. Nasal washings and aspirates are unacceptable for Xpert Xpress SARS-CoV-2/FLU/RSV testing.  Fact Sheet for Patients:  bloggercourse.com  Fact Sheet for Healthcare Providers: seriousbroker.it  This test is not yet approved or cleared by the [place]  FDA and has been authorized for detection and/or diagnosis of SARS-CoV-2 by FDA under an Emergency Use Authorization (EUA). This EUA will remain in effect (meaning this test can be used) for the duration of the COVID-19 declaration under Section 564(b)(1) of the Act, 21 U.S.C. section 360bbb-3(b)(1), unless the authorization is terminated or revoked.     Resp Syncytial Virus by PCR NEGATIVE NEGATIVE Final    Comment: (NOTE) Fact Sheet for Patients: bloggercourse.com  Fact Sheet  for Healthcare Providers: seriousbroker.it  This test is not yet approved or cleared by the [place]  FDA and has been authorized for detection and/or diagnosis of SARS-CoV-2 by FDA under an Emergency Use Authorization (EUA). This EUA will remain in effect (meaning this test can be used) for the duration of the COVID-19 declaration under Section 564(b)(1) of the Act, 21 U.S.C. section 360bbb-3(b)(1), unless the authorization is terminated or revoked.  Performed at [facility], [address] [place], [place] [address]      Radiology Studies: DG Pelvis 1-2 Views Result Date: 03/25/2024 EXAM: 1 OR 2 VIEW(S) XRAY OF THE PELVIS 03/25/2024 05:19:00 PM COMPARISON: None available. CLINICAL HISTORY: fall, pain FINDINGS: BONES AND JOINTS: Significant soft tissue artifact overlying the lower pelvis. No definite pubic rami fractures. The pubic symphysis and si joints are intact. No definite hip fractures. Advanced vascular disease. Degenerative changes of the lower lumbar spine. No focal osseous lesion. SOFT TISSUES: Vascular calcifications. IMPRESSION: 1. No acute hip or pelvic fractures 2. Atherosclerotic vascular calcifications. Electronically signed by: Maude Stammer MD 03/25/2024 06:07 PM EST RP Workstation: [id number]   CT HEAD WO CONTRAST ( ) Result Date: 03/25/2024 CLINICAL DATA:  Head and neck trauma fell in bathroom EXAM: CT HEAD WITHOUT CONTRAST CT CERVICAL SPINE WITHOUT CONTRAST TECHNIQUE: Multidetector CT imaging of the head and cervical spine was performed following the standard protocol without intravenous contrast. Multiplanar CT image reconstructions of the cervical spine were also generated. RADIATION DOSE REDUCTION: This exam was performed according to the departmental dose-optimization program which includes automated exposure control, adjustment of the mA and/or kV according to patient size and/or use of iterative reconstruction technique.  COMPARISON:  CT brain 02/18/2024, MRI 02/01/2024, CT brain and cervical spine 01/14/2024 FINDINGS: CT HEAD FINDINGS Brain: No acute territorial infarction, hemorrhage or intracranial mass. Minimal encephalomalacia at the right temporal occipital junction at the site of prior hemorrhage. Atrophy and mild chronic small vessel ischemic changes of the white matter. Nonenlarged ventricles Vascular: No hyperdense vessels.  Carotid vascular calcification Skull: Normal. Negative for fracture or focal lesion. Sinuses/Orbits: No acute finding. Other: Small right forehead scalp hematoma CT CERVICAL SPINE FINDINGS Alignment: Facet alignment within normal limits. Mild reversal of cervical lordosis. Trace retrolisthesis C6 on C7. Skull base and vertebrae: No acute fracture. No primary bone lesion or focal pathologic process. Soft tissues and spinal canal: No prevertebral fluid or swelling. No visible canal hematoma. Disc levels: Multilevel degenerative change. Advanced disc space narrowing C5-C6 and C6-C7. At least mild canal stenosis C5-C6 secondary to posterior disc osteophyte complex. Upper chest: No acute finding. Ten year right thyroid calcification, no specific imaging follow-up recommended. Other: Carotid vascular calcification IMPRESSION: 1. No CT evidence for acute intracranial abnormality. Atrophy and chronic small vessel ischemic changes of the white matter. Small right forehead scalp hematoma. 2. Reversal of cervical lordosis with degenerative changes. No acute osseous abnormality. Electronically Signed  By: Luke Bun M.D.   On: 03/25/2024 16:54   CT Cervical Spine Wo Contrast Result Date: 03/25/2024 CLINICAL DATA:  Head and neck trauma fell in bathroom EXAM: CT HEAD WITHOUT CONTRAST CT CERVICAL SPINE WITHOUT CONTRAST TECHNIQUE: Multidetector CT imaging of the head and cervical spine was performed following the standard protocol without intravenous contrast. Multiplanar CT image reconstructions of the  cervical spine were also generated. RADIATION DOSE REDUCTION: This exam was performed according to the departmental dose-optimization program which includes automated exposure control, adjustment of the mA and/or kV according to patient size and/or use of iterative reconstruction technique. COMPARISON:  CT brain 02/18/2024, MRI 02/01/2024, CT brain and cervical spine 01/14/2024 FINDINGS: CT HEAD FINDINGS Brain: No acute territorial infarction, hemorrhage or intracranial mass. Minimal encephalomalacia at the right temporal occipital junction at the site of prior hemorrhage. Atrophy and mild chronic small vessel ischemic changes of the white matter. Nonenlarged ventricles Vascular: No hyperdense vessels.  Carotid vascular calcification Skull: Normal. Negative for fracture or focal lesion. Sinuses/Orbits: No acute finding. Other: Small right forehead scalp hematoma CT CERVICAL SPINE FINDINGS Alignment: Facet alignment within normal limits. Mild reversal of cervical lordosis. Trace retrolisthesis C6 on C7. Skull base and vertebrae: No acute fracture. No primary bone lesion or focal pathologic process. Soft tissues and spinal canal: No prevertebral fluid or swelling. No visible canal hematoma. Disc levels: Multilevel degenerative change. Advanced disc space narrowing C5-C6 and C6-C7. At least mild canal stenosis C5-C6 secondary to posterior disc osteophyte complex. Upper chest: No acute finding. Ten year right thyroid calcification, no specific imaging follow-up recommended. Other: Carotid vascular calcification IMPRESSION: 1. No CT evidence for acute intracranial abnormality. Atrophy and chronic small vessel ischemic changes of the white matter. Small right forehead scalp hematoma. 2. Reversal of cervical lordosis with degenerative changes. No acute osseous abnormality. Electronically Signed   By: Luke Bun M.D.   On: 03/25/2024 16:54    Scheduled Meds:  aspirin   81 mg Oral Daily   DULoxetine   30 mg Oral Daily    famotidine   10 mg Oral QHS   fluticasone  furoate-vilanterol  1 puff Inhalation Daily   melatonin  2.5 mg Oral QHS   pantoprazole   40 mg Oral Daily   sodium chloride  flush  3 mL Intravenous Q12H   traZODone   75 mg Oral QHS   Continuous Infusions:  heparin  800 Units/hr (03/26/24 0357)     LOS: 1 day  MDM: Patient is high risk for one or more organ failure.  They necessitate ongoing hospitalization for continued IV therapies and subsequent lab monitoring. Total time spent interpreting labs and vitals, reviewing the medical record, coordinating care amongst consultants and care team members, directly assessing and discussing care with the patient and/or family: 55 min   Kaevion Sinclair, DO [facility]  To contact the attending physician between 7A-7P please use Epic Chat. To contact the covering physician during after hours 7P-7A, please review Amion.  03/26/2024, 8:11 AM   *This document has been created with the assistance of dictation software. Please excuse typographical errors. *

## 2024-03-26 NOTE — Progress Notes (Signed)
 Pt receives outpt HD at [facility] MWF. Navigator following to assist with any HD needs.  Suzen Satchel Dialysis Navigator [phone]

## 2024-03-26 NOTE — ED Notes (Signed)
 Heparin  restarted at 41mL/Hr

## 2024-03-26 NOTE — Consult Note (Signed)
 Cardiology Consultation:   Patient ID: Katie [name]; [id number]; April 15, 1956   Admit date: 03/25/2024 Date of Consult: 03/26/2024  Primary Care Provider: Cordella Corning, FNP Primary Cardiologist: Katie [name]: Katie [name] Primary Electrophysiologist:  None   Patient Profile:   Katie [name] is a 68 y.o. female with a hx of CAD, severe aortic stenosis, HFpEF, ESRD on HD (MWF), recurrent syncope with associated intracranial bleed in 01/2024, CVA, COPD with prior tobacco use, anemia of chronic disease, thrombocytopenia, MDD, and GERD who is being seen today for the evaluation of recurrent syncope at the request of Katie [name].  History of Present Illness:   Katie [name] underwent echo in 06/2023 with LVEF=60-65%. Severe aortic stenosis with mean gradient 36.5 mmHg, AVA 0.83 cm2, DI 0.31. Cardiac cath 08/15/23 with moderate mid LAD stenosis. Mean aortic valve gradient over 40 mmHg. Cardiac CT 11/12/23 with severely calcified AV (calcium  score 3688) and valve area suitable for a 23 mm Edwards Sapien 3 valve. CTA with favorable anatomy for transfemoral access for TAVR. Katie [name] was see by Dr. Shyrl on 12/12/23 and TAVR was scheduled for 01/07/24. Her procedure was cancelled due to a hospitalization after a fall. Katie [name] had another fall 01/13/24 after possible syncope. Katie [name] was treated for pneumonia. Echo 01/13/24 with LVEF=60-65%. Moderate mitral stenosis. Severe aortic stenosis with mean gradient 47 mmHg, AVA 0.7 cm2, SVI 37, DI 0.28. Katie [name] was seen in the ED again on 01/24/24 with weakness and nausea and again on 01/31/24 with confusion. Head CT with intracranial hemorrhage.  In follow up with the structural [name] team on 02/10/2024, Katie [name] was not felt to be a candidate for TAVR due to multiple comorbid conditions including recent intracranial bleed in the setting of fall/syncope earlier that month, ESRD, and COPD.   Katie [name] was readmitted to [facility] on 03/25/2024 with recurrent syncope after getting up from HD to void.  Katie [name] was without chest pain or palpitations. Evaluation in the ED with stable vital signs and labs notable for a troponin elevation of 289. CT without acute intracranial process with a small right forehead hematoma. Katie [name] was given aspirin  324 mg p.o. once along with Zofran  and, oxycodone  and placed on a heparin  gtt. Telemetry has shown sinus rhythm without evidence of arrhythmia, prolonged pause, or high-grade AV block. Currently without symptoms of angina. Reports I want to live.    Past Medical History:  Diagnosis Date   Chronic diastolic [name] failure (HCC)    COPD (chronic obstructive pulmonary disease) (HCC)    Diabetes mellitus without complication (HCC)    ESRD on dialysis (HCC)    Hypertension    Severe aortic stenosis     Past Surgical History:  Procedure Laterality Date   A/V SHUNT INTERVENTION Left 12/21/2021   Procedure: A/V SHUNT INTERVENTION;  Surgeon: Katie Selinda RAMAN, MD;  Location: [facility];  Service: Cardiovascular;  Laterality: Left;   APPENDECTOMY     AV FISTULA PLACEMENT Left 03/16/2021   Procedure: INSERTION OF ARTERIOVENOUS (AV) GORE-TEX GRAFT ARM;  Surgeon: Katie Selinda RAMAN, MD;  Location: [facility] OR[facility];  Service: Vascular;  Laterality: Left;   DIALYSIS/PERMA CATHETER INSERTION N/A 05/16/2020   Procedure: DIALYSIS/PERMA CATHETER INSERTION;  Surgeon: Katie Selinda RAMAN, MD;  Location: [facility];  Service: Cardiovascular;  Laterality: N/A;   DIALYSIS/PERMA CATHETER REMOVAL N/A 03/26/2022   Procedure: DIALYSIS/PERMA CATHETER REMOVAL;  Surgeon: Katie Selinda RAMAN, MD;  Location: [facility];  Service: Cardiovascular;  Laterality: N/A;   DIALYSIS/PERMA CATHETER REPAIR N/A 01/22/2022  Procedure: DIALYSIS/PERMA CATHETER INSERT;  Surgeon: Katie Selinda RAMAN, MD;  Location: [facility];  Service: Cardiovascular;  Laterality: N/A;   MANDIBLE SURGERY     RENAL BIOPSY Right    RIGHT [name] CATH N/A 05/10/2020   Procedure: RIGHT [name] CATH;  Surgeon: Mady Bruckner, MD;   Location: [facility];  Service: Cardiovascular;  Laterality: N/A;   RIGHT/LEFT [name] CATH AND CORONARY ANGIOGRAPHY Bilateral 08/15/2023   Procedure: RIGHT/LEFT [name] CATH AND CORONARY ANGIOGRAPHY;  Surgeon: Anner Alm ORN, MD;  Location: [facility];  Service: Cardiovascular;  Laterality: Bilateral;   TEMPORARY DIALYSIS CATHETER N/A 05/11/2020   Procedure: TEMPORARY DIALYSIS CATHETER;  Surgeon: Katie Selinda RAMAN, MD;  Location: [facility];  Service: Cardiovascular;  Laterality: N/A;     Home Meds: Prior to Admission medications   Medication Sig Start Date End Date Taking? Authorizing Provider  acetaminophen  (TYLENOL ) 500 MG tablet Take 500 mg by mouth 3 (three) times daily. Take additional 500 mg every 6 hours as needed for pain   Yes [provider]  albuterol  (VENTOLIN  HFA) 108 (90 Base) MCG/ACT inhaler Inhale 1 puff into the lungs every 6 (six) hours as needed for wheezing. 01/29/22  Yes [provider]  aspirin  81 MG chewable tablet Chew 81 mg by mouth daily. 01/14/24  Yes [provider]  DULoxetine  (CYMBALTA ) 30 MG capsule Take 30 mg by mouth daily. 08/06/23  Yes [provider]  famotidine  (PEPCID ) 20 MG tablet Take 10 mg by mouth at bedtime.   Yes [provider]  fluticasone -salmeterol (ADVAIR) 250-50 MCG/ACT AEPB Inhale 1 puff into the lungs 2 (two) times daily.   Yes [provider]  loperamide  (IMODIUM  A-D) 2 MG tablet Take 2 mg by mouth 4 (four) times daily as needed for diarrhea or loose stools.   Yes [provider]  melatonin 3 MG TABS tablet Take 3 mg by mouth at bedtime.   Yes [provider]  midodrine  (PROAMATINE ) 5 MG tablet Take 5 mg by mouth. Monday, Wednesday, Friday prior to dialysis 05/06/23  Yes [provider]  MIRALAX  17 GM/SCOOP powder Take 17 g by mouth daily as needed for mild constipation. 03/09/24  Yes [provider]   neomycin-bacitracin-polymyxin [id number] OINT Apply 1 Application topically daily. 03/24/24  Yes [provider]  ondansetron  (ZOFRAN ) 4 MG tablet Take 4 mg by mouth every Monday, Wednesday, and Friday. 04/27/23  Yes [provider]  oxyCODONE  (OXY IR/ROXICODONE ) 5 MG immediate release tablet Take 5 mg by mouth 4 (four) times daily as needed.   Yes [provider]  pantoprazole  (PROTONIX ) 40 MG tablet Take 40 mg by mouth daily. 01/14/24  Yes [provider]  pramoxine (SARNA SENSITIVE) 1 % LOTN Apply 1 Application topically 2 (two) times daily.   Yes [provider]  tiZANidine  (ZANAFLEX ) 2 MG tablet Take 2 mg by mouth at bedtime.   Yes [provider]  topiramate  (TOPAMAX ) 50 MG tablet Take 1 tablet (50 mg total) by mouth 2 (two) times daily as needed (headache). 03/21/21  Yes Sreenath, Sudheer B, MD  traZODone  (DESYREL ) 50 MG tablet Take 75 mg by mouth at bedtime. 01/14/24  Yes [provider]    Inpatient Medications: Scheduled Meds:  aspirin   81 mg Oral Daily   DULoxetine   30 mg Oral Daily   famotidine   10 mg Oral QHS   fluticasone  furoate-vilanterol  1 puff Inhalation Daily   melatonin  2.5 mg  Oral QHS   pantoprazole   40 mg Oral Daily   sodium chloride  flush  3 mL Intravenous Q12H   traZODone   75 mg Oral QHS   Continuous Infusions:  PRN Meds: acetaminophen  **OR** acetaminophen , albuterol , hydrALAZINE , loperamide , midodrine , ondansetron  **OR** ondansetron  (ZOFRAN ) IV, oxyCODONE , topiramate   Allergies:   Allergies  Allergen Reactions   Penicillins Nausea And Vomiting    Pt states only pills make her vomit Other reaction(s): Nausea/Vomit    Social History:   Social History   Socioeconomic History   Marital status: Single    Spouse name: Not on file   Number of children: 0   Years of education: Not on file   Highest education level: Not on file  Occupational History   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  Tobacco Use   Smoking status: Former    Types: Cigarettes   Smokeless tobacco: Never   Tobacco comments:    Quit over 15 years ago  Vaping Use   Vaping status: Never Used  Substance and Sexual Activity   Alcohol  use: Not Currently   Drug use: Never   Sexual activity: Not Currently  Other Topics Concern   Not on file  Social History Narrative   Not on file   Social Drivers of Health   Financial Resource Strain: Not on file  Food Insecurity: No Food Insecurity (02/01/2024)   Hunger Vital Sign    Worried About Running Out of Food in the Last Year: Never true    Ran Out of Food in the Last Year: Never true  Transportation Needs: No Transportation Needs (02/01/2024)   PRAPARE - Administrator, Civil Service (Medical): No    Lack of Transportation (Non-Medical): No  Physical Activity: Not on file  Stress: Not on file  Social Connections: Socially Isolated (02/01/2024)   Social Connection and Isolation Panel    Frequency of Communication with Friends and Family: More than three times a week    Frequency of Social Gatherings with Friends and Family: Patient declined    Attends Religious Services: Patient declined    Database Administrator or Organizations: No    Attends Banker Meetings: Never    Marital Status: Never married  Intimate Partner Violence: Not At Risk (02/01/2024)   Humiliation, Afraid, Rape, and Kick questionnaire    Fear of Current or Ex-Partner: No    Emotionally Abused: No    Physically Abused: No    Sexually Abused: No     Family History:   Family History  Problem Relation Age of Onset   [name] attack Mother    Asthma Mother    Uterine cancer Mother    Skin cancer Father    Prostate cancer Father    CAD Father    Parkinson's disease Father    Lung cancer Brother     ROS:  Review of Systems  Constitutional:  Positive for malaise/fatigue. Negative for chills, diaphoresis, fever and weight loss.  HENT:   Negative for congestion.   Eyes:  Negative for discharge and redness.  Respiratory:  Negative for cough, sputum production, shortness of breath and wheezing.   Cardiovascular:  Negative for chest pain, palpitations, orthopnea, claudication, leg swelling and PND.  Gastrointestinal:  Negative for abdominal pain, heartburn, nausea and vomiting.  Musculoskeletal:  Positive for falls. Negative for myalgias.  Skin:  Negative for rash.  Neurological:  Positive for loss of consciousness and weakness. Negative for dizziness, tingling, tremors, sensory  change, speech change and focal weakness.  Endo/Heme/Allergies:  Does not bruise/bleed easily.  Psychiatric/Behavioral:  Negative for substance abuse. The patient is not nervous/anxious.   All other systems reviewed and are negative.     Physical Exam/Data:   Vitals:   03/26/24 0530 03/26/24 0551 03/26/24 0630 03/26/24 0807  BP: (!) 150/69  (!) 152/58 (!) 166/50  Pulse: 73  74 79  Resp: 18   14  Temp:  98.5 F (36.9 C)  98.8 F (37.1 C)  TempSrc:  Oral  Oral  SpO2: 98%  97% 96%  Weight:      Height:       No intake or output data in the 24 hours ending 03/26/24 0914 Filed Weights   03/25/24 1710  Weight: 51.8 kg   Body mass index is 22.32 kg/m.   Physical Exam: General: Chronically ill appearing, well developed, well nourished, in no acute distress. Head: Normocephalic, sclera non-icteric, no xanthomas, nares without discharge.  Neck: Negative for carotid bruits. JVD not elevated. Lungs: Clear bilaterally to auscultation without wheezes, rales, or rhonchi. Breathing is unlabored. [name]: RRR with S1 S2. III/VI systolic murmur RUSB, no rubs, or gallops appreciated. Abdomen: Soft, non-tender, non-distended with normoactive bowel sounds. No hepatomegaly. No rebound/guarding. No obvious abdominal masses. Msk:  Strength and tone appear normal for age. Extremities: No clubbing or cyanosis. No edema. Distal pedal pulses are 2+ and equal  bilaterally. Neuro: Alert and oriented X 3. No facial asymmetry. No focal deficit. Moves all extremities spontaneously. Psych:  Responds to questions appropriately with a normal affect.   EKG:  The EKG was personally reviewed and demonstrates: NSR, 91 bpm, left axis deviation, LVH, nonspecific st/t changes Telemetry:  Telemetry was personally reviewed and demonstrates: SR, 80s bpm  Weights: Filed Weights   03/25/24 1710  Weight: 51.8 kg    Relevant CV Studies:  Limited echo 01/14/2024: 1. Left ventricular ejection fraction, by estimation, is 60 to 65%. Left  ventricular ejection fraction by 3D volume is 52 %. The left ventricle has  normal function. There is moderate left ventricular hypertrophy. Left  ventricular diastolic parameters  are indeterminate. The average left ventricular global longitudinal strain  is -20.0 %. The global longitudinal strain is normal.   2. Right ventricular systolic function is normal. The right ventricular  size is normal. Mildly increased right ventricular wall thickness. There  is severely elevated pulmonary artery systolic pressure. The estimated  right ventricular systolic pressure  is 69.5 mmHg.   3. Trivial mitral valve regurgitation. Moderate mitral stenosis;  transvalvular gradient likely accentuated by high-flow state. The mean  mitral valve gradient is 10.0 mmHg.   4. Tricuspid valve regurgitation is mild to moderate.   5. The aortic valve has an indeterminant number of cusps. There is  moderate calcification of the aortic valve. There is severe thickening of  the aortic valve. Aortic valve regurgitation is moderate. Severe aortic  valve stenosis. Aortic valve area, by  VTI measures 0.71 cm. Aortic valve mean gradient measures 47.0 mmHg.   6. The inferior vena cava is normal in size with <50% respiratory  variability, suggesting right atrial pressure of 8 mmHg. ___________  [facility] 08/15/2023:    Very Small Caliber Diagonal Branches: 1st  Diag lesion is 90% stenosed. 2nd Diag lesion is 80% stenosed.   Mid LAD lesion is 55% stenosed.   LV end diastolic pressure is normal.   There is severe aortic valve stenosis.   Anticipated discharge date to be determined.  Plan per discussion with primary cardiology team and nephrologist would be to have the patient be admitted under the hospitalist service.  Katie [name] will need to have dialysis done while Katie [name] is here and have titration of medications.   Recommend Aspirin  81mg  daily for moderate CAD.   Dominance: Codominant  POST-OPERATIVE DIAGNOSIS:   Severe heavily calcified aortic stenosis with mean gradient by cardiac catheterization of 40 to 50 mmHg (peak to peak gradient 59 mmHg) Single-vessel disease with a 60% LAD stenosis just after tandem very small caliber D1 and D2 (ostium of D1-D2 a roughly 90 and 80%); large codominant LCx with relatively no significant disease; moderate caliber codominant RCA with minimal disease in the PDA. RHC hemodynamics: RAP 1 mmHg, RV P-EDP 44/1-2 mmHg; PAP-mean 44/12-24 mmHg, PCWP mean 5 mmHg.;  Fick Cardiac Output-Index 6.41-3.87 with ao sat 93% and PA sat 70%. LV P-EDP 191/1-23 mmHg; AO P-MAP 140/61-95 mmHg Aortic AoV gradient P-P59 0.5 mmHg; mean AoV gradient estimated 45 to 50 mmHg     PT TO BE ADMITTED PER NEPHROLOGIST, DR LATEEF    Plan per discussion with primary cardiology team and nephrologist would be to have the patient be admitted under the hospitalist service.  Katie [name] will need to have dialysis done while Katie [name] is here and have titration of medications.   Recommend Aspirin  81mg  daily for moderate CAD.   Will need outpatient structural [name] team  Laboratory Data:  Chemistry Recent Labs  Lab 03/25/24 1504 03/26/24 0204  NA 141 138  K 3.9 3.8  CL 100 100  CO2 30 31  GLUCOSE 76 85  BUN 30* 39*  CREATININE 2.13* 2.56*  CALCIUM  9.1 8.4*  GFRNONAA 25* 20*  ANIONGAP 11 8    Recent Labs  Lab 03/25/24 1504  PROT 6.6  ALBUMIN  3.3*  AST  50*  ALT 60*  ALKPHOS 121  BILITOT 0.6   Hematology Recent Labs  Lab 03/25/24 1504 03/26/24 0204  WBC 9.0 7.1  RBC 3.72* 3.06*  HGB 12.4 10.1*  HCT 39.7 32.0*  MCV 106.7* 104.6*  MCH 33.3 33.0  MCHC 31.2 31.6  RDW 13.9 13.7  PLT 103* 104*   Cardiac EnzymesNo results for input(s): TROPONINI in the last 168 hours. No results for input(s): TROPIPOC in the last 168 hours.  BNPNo results for input(s): BNP, PROBNP in the last 168 hours.  DDimer No results for input(s): DDIMER in the last 168 hours.  Radiology/Studies:  DG Pelvis 1-2 Views Result Date: 03/25/2024 IMPRESSION: 1. No acute hip or pelvic fractures 2. Atherosclerotic vascular calcifications. Electronically signed by: Maude Stammer MD 03/25/2024 06:07 PM EST RP Workstation: [id number]   CT HEAD WO CONTRAST ( ) Result Date: 03/25/2024 IMPRESSION: 1. No CT evidence for acute intracranial abnormality. Atrophy and chronic small vessel ischemic changes of the white matter. Small right forehead scalp hematoma. 2. Reversal of cervical lordosis with degenerative changes. No acute osseous abnormality. Electronically Signed   By: Luke Bun M.D.   On: 03/25/2024 16:54   CT Cervical Spine Wo Contrast Result Date: 03/25/2024 IMPRESSION: 1. No CT evidence for acute intracranial abnormality. Atrophy and chronic small vessel ischemic changes of the white matter. Small right forehead scalp hematoma. 2. Reversal of cervical lordosis with degenerative changes. No acute osseous abnormality. Electronically Signed   By: Luke Bun M.D.   On: 03/25/2024 16:54    Assessment and Plan:   1. Recurrent syncope with known severe aortic stenosis: -Occurred after getting up for HD to void -Not felt to  be a candidate for TAVR by structural [name] team due to prior intracranial bleed (from fall/syncope - possibly related to aortic stenosis), COPD, and ESRD -Katie [name] reports I want to live -MD will reach back out to structural [name]  team to update regarding recurrent syncope to see if Katie [name] would be a candidate; if not, consider referral to [facility] for 2nd opinion  -Without surgical intervention of her severe aortic stenosis, the patient will not be able to tolerate HD and palliative approach would need to be considered. This does not align with her wishes  -Repeat echo already ordered and pending -Narrow therapeutic window   2. CAD involving the native coronary arteries with elevated high sensitivity troponin: -Currently without symptoms of angina -Mildly elevated -Recent LHC with moderate nonobstructive major epicardial stenosis as above -Likely related to demand from syncope with severe AS with known CAD and underlying ESRD as opposed to an acute thrombotic event  -ASA -No indication for heparin  gtt at this time, especially in the setting of recent intracranial bleed -No plans for inpatient ischemic testing at this time     For questions or updates, please contact [facility] Please consult [email or web address] for contact info under Cardiology/STEMI.   Signed, Bernardino Bring, PA-C [facility] Pager: [phone] 03/26/2024, 9:14 AM

## 2024-03-26 NOTE — Progress Notes (Signed)
 This nurse attempted to measure and document pictures for existing wounds x 3 times today. At 815 AM after admission, at 1558, and at 53. Patient stated that she was in too much pain. Bilat legs below knees dry and excoriated, with some redness from shoes, picture taken and documented in media.

## 2024-03-27 ENCOUNTER — Other Ambulatory Visit (HOSPITAL_COMMUNITY): Payer: Self-pay

## 2024-03-27 DIAGNOSIS — I350 Nonrheumatic aortic (valve) stenosis: Secondary | ICD-10-CM | POA: Diagnosis not present

## 2024-03-27 DIAGNOSIS — I050 Rheumatic mitral stenosis: Secondary | ICD-10-CM | POA: Diagnosis not present

## 2024-03-27 DIAGNOSIS — I214 Non-ST elevation (NSTEMI) myocardial infarction: Secondary | ICD-10-CM | POA: Diagnosis not present

## 2024-03-27 DIAGNOSIS — R7989 Other specified abnormal findings of blood chemistry: Secondary | ICD-10-CM | POA: Diagnosis not present

## 2024-03-27 LAB — RENAL FUNCTION PANEL
Albumin: 2.9 g/dL — ABNORMAL LOW (ref 3.5–5.0)
Anion gap: 11 (ref 5–15)
BUN: 59 mg/dL — ABNORMAL HIGH (ref 8–23)
CO2: 27 mmol/L (ref 22–32)
Calcium: 9 mg/dL (ref 8.9–10.3)
Chloride: 97 mmol/L — ABNORMAL LOW (ref 98–111)
Creatinine, Ser: 3.79 mg/dL — ABNORMAL HIGH (ref 0.44–1.00)
GFR, Estimated: 13 mL/min — ABNORMAL LOW (ref >60)
Glucose, Bld: 111 mg/dL — ABNORMAL HIGH (ref 70–99)
Phosphorus: 3.8 mg/dL (ref 2.5–4.6)
Potassium: 3.9 mmol/L (ref 3.5–5.1)
Sodium: 135 mmol/L (ref 135–145)

## 2024-03-27 LAB — CBC
HCT: 30 % — ABNORMAL LOW (ref 36.0–46.0)
Hemoglobin: 9.8 g/dL — ABNORMAL LOW (ref 12.0–15.0)
MCH: 33 pg (ref 26.0–34.0)
MCHC: 32.7 g/dL (ref 30.0–36.0)
MCV: 101 fL — ABNORMAL HIGH (ref 80.0–100.0)
Platelets: 105 K/uL — ABNORMAL LOW (ref 150–400)
RBC: 2.97 MIL/uL — ABNORMAL LOW (ref 3.87–5.11)
RDW: 13.4 % (ref 11.5–15.5)
WBC: 6.1 K/uL (ref 4.0–10.5)
nRBC: 0 % (ref 0.0–0.2)

## 2024-03-27 LAB — GLUCOSE, CAPILLARY: Glucose-Capillary: 94 mg/dL (ref 70–99)

## 2024-03-27 MED ORDER — DIPHENHYDRAMINE HCL 50 MG/ML IJ SOLN
INTRAMUSCULAR | Status: AC
  Filled 2024-03-27: qty 1

## 2024-03-27 MED ORDER — METOPROLOL TARTRATE 25 MG PO TABS
12.5000 mg | ORAL_TABLET | Freq: Two times a day (BID) | ORAL | Status: DC
  Administered 2024-03-27 – 2024-03-31 (×9): 12.5 mg via ORAL
  Filled 2024-03-27 (×9): qty 1

## 2024-03-27 MED ORDER — ONDANSETRON HCL 4 MG/2ML IJ SOLN
INTRAMUSCULAR | Status: AC
  Filled 2024-03-27: qty 2

## 2024-03-27 MED ORDER — DIPHENHYDRAMINE HCL 50 MG/ML IJ SOLN
25.0000 mg | Freq: Once | INTRAMUSCULAR | Status: AC
  Administered 2024-03-27: 25 mg via INTRAVENOUS

## 2024-03-27 MED ORDER — ACETAMINOPHEN 325 MG PO TABS
ORAL_TABLET | ORAL | Status: AC
  Filled 2024-03-27: qty 2

## 2024-03-27 NOTE — Progress Notes (Signed)
 PROGRESS NOTE    Katie [name]  FMW:[id number] DOB: 1956-01-30 DOA: 03/25/2024 PCP: Cordella Corning, FNP  Chief Complaint  Patient presents with   Loss of Consciousness    Hospital Course:  Katie [name] is a 68 year old female with depression, anxiety, GERD, ESRD on HD, COPD, hypertension, who presents to the ED with syncope at dialysis.  Patient was at dialysis when she got up to use the restroom and had syncopal event.  On arrival to ED she is mildly tachycardic to 104, vitals otherwise stable.  High-sensitivity troponin 289, AST 50, ALT 60, platelets 103.  She was given aspirin  324, started on heparin  bolus and drip.  She was admitted for further workup.  Only etiology of syncope appears to be severe aortic stenosis.  She was determined not to be a TAVR candidate and was recommended for hospice.  She is not interested in this recommendation and is pursuing a second opinion  Subjective: Was seen during dialysis this morning.  Initially very drowsy but becomes more alert when discussing discharge home.  She is very anxious to return home   Objective: Vitals:   03/27/24 1155 03/27/24 1419 03/27/24 1421 03/27/24 1423  BP: (!) 162/61 (!) 118/54 (!) 110/54 (!) 109/50  Pulse: 86 74 78 88  Resp: 20     Temp: 98.1 F (36.7 C)     TempSrc: Oral     SpO2: 97% 97% 93% 94%  Weight:      Height:        Intake/Output Summary (Last 24 hours) at 03/27/2024 1452 Last data filed at 03/27/2024 1130 Gross per 24 hour  Intake 480 ml  Output 1100 ml  Net -620 ml   Filed Weights   03/27/24 0500 03/27/24 0734 03/27/24 1122  Weight: 53.7 kg 53.6 kg 52.6 kg    Examination: General exam: Appears calm and comfortable, NAD  Respiratory system: No work of breathing, symmetric chest wall expansion Cardiovascular system: Significant systolic murmur Gastrointestinal system: Abdomen is nondistended, soft and nontender.  Neuro: Alert and oriented. No focal neurological deficits. Extremities:  Symmetric, expected ROM Skin: No rashes, lesions Psychiatry: Demonstrates appropriate judgement and insight. Mood & affect appropriate for situation.   Assessment & Plan:  Principal Problem:   NSTEMI (non-ST elevated myocardial infarction) (HCC) Active Problems:   Hypertension   Type 2 diabetes mellitus (HCC)   ESRD (end stage renal disease) on dialysis (HCC)   Adjustment disorder with mixed anxiety and depressed mood   Chronic pain syndrome   Elevated troponin    Troponin elevation - Initially thought to be NSTEMI, cardiology now believes troponin elevation is likely secondary to demand ischemia - Has had recent coronary evaluation which was found to be predominantly in small caliber vessels not amenable to PCI. - Was initially on heparin  drip, I have discontinued this morning given cardiology does not concern of NSTEMI. - Echo EF 60 to 65%, moderate LVH, no RWMA, moderate mitral stenosis, aortic valve has severe calcification with severe stenosis. - Cardiology consulted  Syncopal event - May have been vasovagal given fluid shift during dialysis, likely complicated by severe aortic stenosis - CTA without pulmonary emboli - Ultimately does need TAVR - Does have orthostatic changes but does not meet criteria for orthostatic hypotension  Severe aortic stenosis - Was planning for TAVR outpatient but due to recent fall and poor functional status this has been canceled.  This was reevaluated today with the structural heart team via cardiology and they feel she is not a TAVR  candidate and recommended palliative approach.  Patient has requested for second opinion - Cardiology has arranged for [facility] referral - Noticed metoprolol  added today, monitor to see if this improves her diastolic filling - In the interim have advised patient that she should mobilize with wheelchair only. - PT/OT ordered.  She will require home health again at DC.  History of subdural hematoma and intraparenchymal  hematoma - After mechanical fall in September - Has follow-up outpatient with neurosurgery - CT head on arrival without acute intracranial abnormality  ESRD - Nephrology consulted, HD on routine schedule now  Type 2 diabetes - Sliding scale insulin  titrate as needed  Hypertension - Resume home meds  Chronic pain syndrome - Home dose oxycodone  resumed  Adjustment disorder with mixed anxiety and depressed mood - Resume home meds  Ascending thoracic aortic aneurysm - Seen on CTA.  4.1 cm.  Stable. - Needs annual outpatient imaging follow-up.  DVT prophylaxis: Heparin    Code Status: Limited: Do not attempt resuscitation (DNR) -DNR-LIMITED -Do Not Intubate/DNI  Disposition: Discharge in a.m. if stable after newly added metoprolol   Consultants:  Treatment Team:  Consulting Physician: Perla Evalene PARAS, MD  Procedures:    Antimicrobials:  Anti-infectives (From admission, onward)    None       Data Reviewed: I have personally reviewed following labs and imaging studies CBC: Recent Labs  Lab 03/25/24 1504 03/26/24 0204 03/27/24 0540  WBC 9.0 7.1 6.1  HGB 12.4 10.1* 9.8*  HCT 39.7 32.0* 30.0*  MCV 106.7* 104.6* 101.0*  PLT 103* 104* 105*   Basic Metabolic Panel: Recent Labs  Lab 03/25/24 1504 03/26/24 0204 03/27/24 0716  NA 141 138 135  K 3.9 3.8 3.9  CL 100 100 97*  CO2 30 31 27   GLUCOSE 76 85 111*  BUN 30* 39* 59*  CREATININE 2.13* 2.56* 3.79*  CALCIUM  9.1 8.4* 9.0  PHOS  --   --  3.8   GFR: Estimated Creatinine Clearance: 10.6 mL/min (A) (by C-G formula based on SCr of 3.79 mg/dL (H)). Liver Function Tests: Recent Labs  Lab 03/25/24 1504 03/27/24 0716  AST 50*  --   ALT 60*  --   ALKPHOS 121  --   BILITOT 0.6  --   PROT 6.6  --   ALBUMIN  3.3* 2.9*   CBG: Recent Labs  Lab 03/27/24 0507  GLUCAP 94    Recent Results (from the past 240 hours)  Resp panel by RT-PCR (RSV, Flu A&B, Covid) Anterior Nasal Swab     Status: None    Collection Time: 03/25/24  6:53 PM   Specimen: Anterior Nasal Swab  Result Value Ref Range Status   SARS Coronavirus 2 by RT PCR NEGATIVE NEGATIVE Final    Comment: (NOTE) SARS-CoV-2 target nucleic acids are NOT DETECTED.  The SARS-CoV-2 RNA is generally detectable in upper respiratory specimens during the acute phase of infection. The lowest concentration of SARS-CoV-2 viral copies this assay can detect is 138 copies/mL. A negative result does not preclude SARS-Cov-2 infection and should not be used as the sole basis for treatment or other patient management decisions. A negative result may occur with  improper specimen collection/handling, submission of specimen other than nasopharyngeal swab, presence of viral mutation(s) within the areas targeted by this assay, and inadequate number of viral copies(<138 copies/mL). A negative result must be combined with clinical observations, patient history, and epidemiological information. The expected result is Negative.  Fact Sheet for Patients:  bloggercourse.com  Fact Sheet for Healthcare  Providers:  seriousbroker.it  This test is no t yet approved or cleared by the [place]  FDA and  has been authorized for detection and/or diagnosis of SARS-CoV-2 by FDA under an Emergency Use Authorization (EUA). This EUA will remain  in effect (meaning this test can be used) for the duration of the COVID-19 declaration under Section 564(b)(1) of the Act, 21 U.S.C.section 360bbb-3(b)(1), unless the authorization is terminated  or revoked sooner.       Influenza A by PCR NEGATIVE NEGATIVE Final   Influenza B by PCR NEGATIVE NEGATIVE Final    Comment: (NOTE) The Xpert Xpress SARS-CoV-2/FLU/RSV plus assay is intended as an aid in the diagnosis of influenza from Nasopharyngeal swab specimens and should not be used as a sole basis for treatment. Nasal washings and aspirates are unacceptable for  Xpert Xpress SARS-CoV-2/FLU/RSV testing.  Fact Sheet for Patients: bloggercourse.com  Fact Sheet for Healthcare Providers: seriousbroker.it  This test is not yet approved or cleared by the [place]  FDA and has been authorized for detection and/or diagnosis of SARS-CoV-2 by FDA under an Emergency Use Authorization (EUA). This EUA will remain in effect (meaning this test can be used) for the duration of the COVID-19 declaration under Section 564(b)(1) of the Act, 21 U.S.C. section 360bbb-3(b)(1), unless the authorization is terminated or revoked.     Resp Syncytial Virus by PCR NEGATIVE NEGATIVE Final    Comment: (NOTE) Fact Sheet for Patients: bloggercourse.com  Fact Sheet for Healthcare Providers: seriousbroker.it  This test is not yet approved or cleared by the [place]  FDA and has been authorized for detection and/or diagnosis of SARS-CoV-2 by FDA under an Emergency Use Authorization (EUA). This EUA will remain in effect (meaning this test can be used) for the duration of the COVID-19 declaration under Section 564(b)(1) of the Act, 21 U.S.C. section 360bbb-3(b)(1), unless the authorization is terminated or revoked.  Performed at [facility], [address] [place], [place] [address]      Radiology Studies: CT Angio Chest Pulmonary Embolism (PE) W or WO Contrast Result Date: 03/26/2024 CLINICAL DATA:  Recurrent syncope after getting up from hemodialysis to void. Clinical concern for pulmonary embolism. No chest symptoms. EXAM: CT ANGIOGRAPHY CHEST WITH CONTRAST TECHNIQUE: Multidetector CT imaging of the chest was performed using the standard protocol during bolus administration of intravenous contrast. Multiplanar CT image reconstructions and MIPs were obtained to evaluate the vascular anatomy. RADIATION DOSE REDUCTION: This exam was performed according to  the departmental dose-optimization program which includes automated exposure control, adjustment of the mA and/or kV according to patient size and/or use of iterative reconstruction technique. CONTRAST:  75mL OMNIPAQUE  IOHEXOL  350 MG/ML SOLN COMPARISON:  01/14/2024 FINDINGS: Cardiovascular: Normally opacified pulmonary arteries with no pulmonary arterial filling defects seen. The ascending thoracic aorta continues to measure 4.1 cm in maximum diameter. Stable enlarged heart. Stable small pericardial effusion with a maximum thickness of 11 mm. Atheromatous calcifications, including the coronary arteries and aorta. Mediastinum/Nodes: Multiple subcentimeter bilateral thyroid nodules. These do not need imaging follow-up. Fluid and air in a dilated proximal esophagus, previously filled with air. No enlarged lymph nodes. Lungs/Pleura: Chronic left lower lobe bronchiectasis and mucous plugging with associated atelectasis. No significant change in bilateral pleural and parenchymal scarring. Mild additional pleural based scarring or atelectasis bilaterally. No pleural fluid. Upper Abdomen: Atheromatous arterial calcifications. Musculoskeletal: Thoracic and lower cervical spine degenerative changes. Review of the MIP images confirms the above findings. IMPRESSION: 1. No pulmonary emboli. 2. Stable 4.1 cm ascending thoracic  aortic aneurysm. Recommend annual imaging followup by CTA or MRA. This recommendation follows 2010 ACCF/AHA/AATS/ACR/ASA/SCA/SCAI/SIR/STS/SVM Guidelines for the Diagnosis and Management of Patients with Thoracic Aortic Disease. Circulation. 2010; 121: Z733-z630. Aortic aneurysm NOS (ICD10-I71.9) 3. Chronic left lower lobe bronchiectasis and mucous plugging with associated atelectasis. 4. Stable bilateral pleural and parenchymal scarring. 5. Stable cardiomegaly and small pericardial effusion. 6. Calcific coronary artery and aortic atherosclerosis. Aortic Atherosclerosis (ICD10-I70.0). Electronically Signed    By: Elspeth Bathe M.D.   On: 03/26/2024 11:40   ECHOCARDIOGRAM COMPLETE Result Date: 03/26/2024    ECHOCARDIOGRAM REPORT   Patient Name:   [facility] Date of Exam: 03/26/2024 Medical Rec #:  [id number]    Height:       60.0 in Accession #:    [id number]   Weight:       114.3 lb Date of Birth:  05-29-1955   BSA:          1.472 m Patient Age:    67 years     BP:           166/50 mmHg Patient Gender: F            HR:           77 bpm. Exam Location:  [facility] Procedure: 2D Echo, Cardiac Doppler and Color Doppler (Both Spectral and Color            Flow Doppler were utilized during procedure). Indications:     NSTEMI  History:         Patient has prior history of Echocardiogram examinations, most                  recent 01/14/2024. CHF, COPD, Aortic Valve Disease; Risk                  Factors:Hypertension and Diabetes. CKD.  Sonographer:     Philomena Daring Referring Phys:  [id number] AMY N COX Diagnosing Phys: Evalene Lunger MD IMPRESSIONS  1. Left ventricular ejection fraction, by estimation, is 60 to 65%. The left ventricle has normal function. The left ventricle has no regional wall motion abnormalities. There is moderate left ventricular hypertrophy. Left ventricular diastolic parameters are consistent with Grade I diastolic dysfunction (impaired relaxation).  2. Right ventricular systolic function is normal. The right ventricular size is normal. There is severely elevated pulmonary artery systolic pressure. The estimated right ventricular systolic pressure is 68.4 mmHg.  3. The mitral valve is normal in structure. Mild mitral valve regurgitation. Moderate mitral stenosis. The mean mitral valve gradient is 10.0 mmHg. Moderate mitral annular calcification.  4. Tricuspid valve regurgitation is moderate.  5. The aortic valve is normal in structure. There is severe calcifcation of the aortic valve. Aortic valve regurgitation is mild to moderate. Severe aortic valve stenosis. Aortic valve area, by VTI measures 0.85 cm.  Aortic valve mean gradient measures 36.5 mmHg. Aortic valve Vmax measures 4.02 m/s.  6. The inferior vena cava is normal in size with greater than 50% respiratory variability, suggesting right atrial pressure of 3 mmHg. FINDINGS  Left Ventricle: Left ventricular ejection fraction, by estimation, is 60 to 65%. The left ventricle has normal function. The left ventricle has no regional wall motion abnormalities. Strain was performed and the global longitudinal strain is indeterminate. The left ventricular internal cavity size was normal in size. There is moderate left ventricular hypertrophy. Left ventricular diastolic parameters are consistent with Grade I diastolic dysfunction (impaired relaxation). Right Ventricle: The right ventricular size is normal. No  increase in right ventricular wall thickness. Right ventricular systolic function is normal. There is severely elevated pulmonary artery systolic pressure. The tricuspid regurgitant velocity is 3.98 m/s, and with an assumed right atrial pressure of 5 mmHg, the estimated right ventricular systolic pressure is 68.4 mmHg. Left Atrium: Left atrial size was normal in size. Right Atrium: Right atrial size was normal in size. Pericardium: There is no evidence of pericardial effusion. Mitral Valve: The mitral valve is normal in structure. There is moderate calcification of the mitral valve leaflet(s). Moderate mitral annular calcification. Mild mitral valve regurgitation. Moderate mitral valve stenosis. MV peak gradient, 18.8 mmHg. The mean mitral valve gradient is 10.0 mmHg. Tricuspid Valve: The tricuspid valve is normal in structure. Tricuspid valve regurgitation is moderate . No evidence of tricuspid stenosis. Aortic Valve: The aortic valve is normal in structure. There is severe calcifcation of the aortic valve. Aortic valve regurgitation is mild to moderate. Severe aortic stenosis is present. Aortic valve mean gradient measures 36.5 mmHg. Aortic valve peak gradient  measures 64.6 mmHg. Aortic valve area, by VTI measures 0.85 cm. Pulmonic Valve: The pulmonic valve was normal in structure. Pulmonic valve regurgitation is not visualized. No evidence of pulmonic stenosis. Aorta: The aortic root is normal in size and structure. Venous: The inferior vena cava is normal in size with greater than 50% respiratory variability, suggesting right atrial pressure of 3 mmHg. IAS/Shunts: No atrial level shunt detected by color flow Doppler. Additional Comments: 3D was performed not requiring image post processing on an independent workstation and was indeterminate.  LEFT VENTRICLE PLAX 2D LVIDd:         3.60 cm   Diastology LVIDs:         2.30 cm   LV e' medial:    5.77 cm/s LV PW:         1.20 cm   LV E/e' medial:  22.5 LV IVS:        1.50 cm   LV e' lateral:   4.90 cm/s LVOT diam:     1.80 cm   LV E/e' lateral: 26.5 LV SV:         69 LV SV Index:   47 LVOT Area:     2.54 cm  RIGHT VENTRICLE             IVC RV Basal diam:  3.40 cm     IVC diam: 1.40 cm RV Mid diam:    2.70 cm RV S prime:     10.70 cm/s TAPSE (M-mode): 2.2 cm LEFT ATRIUM             Index        RIGHT ATRIUM           Index LA diam:        3.00 cm 2.04 cm/m   RA Area:     15.60 cm LA Vol (A2C):   51.5 ml 35.00 ml/m  RA Volume:   36.40 ml  24.74 ml/m LA Vol (A4C):   42.0 ml 28.54 ml/m LA Biplane Vol: 48.4 ml 32.89 ml/m  AORTIC VALVE AV Area (Vmax):    0.81 cm AV Area (Vmean):   0.80 cm AV Area (VTI):     0.85 cm AV Vmax:           402.00 cm/s AV Vmean:          281.000 cm/s AV VTI:            0.807 m AV Peak Grad:  64.6 mmHg AV Mean Grad:      36.5 mmHg LVOT Vmax:         128.00 cm/s LVOT Vmean:        87.900 cm/s LVOT VTI:          0.270 m LVOT/AV VTI ratio: 0.33  AORTA Ao Root diam: 2.90 cm MITRAL VALVE                TRICUSPID VALVE MV Area (PHT): 3.42 cm     TR Peak grad:   63.4 mmHg MV Area VTI:   1.49 cm     TR Vmax:        398.00 cm/s MV Peak grad:  18.8 mmHg MV Mean grad:  10.0 mmHg    SHUNTS MV Vmax:        2.17 m/s     Systemic VTI:  0.27 m MV Vmean:      152.0 cm/s   Systemic Diam: 1.80 cm MV Decel Time: 222 msec MV E velocity: 130.00 cm/s MV A velocity: 206.00 cm/s MV E/A ratio:  0.63 Evalene Lunger MD Electronically signed by Evalene Lunger MD Signature Date/Time: 03/26/2024/11:39:41 AM    Final    DG Chest Port 1 View Result Date: 03/26/2024 EXAM: 1 VIEW(S) XRAY OF THE CHEST 03/26/2024 08:50:18 AM COMPARISON: None available. CLINICAL HISTORY: Dyspnea. FINDINGS: LINES, TUBES AND DEVICES: Telemetry leads overlie the chest. LUNGS AND PLEURA: Left base atelectasis or infiltrate. Tiny bilateral effusions. No pneumothorax. HEART AND MEDIASTINUM: Cardiopericardial silhouette is enlarged. BONES AND SOFT TISSUES: No acute bony abnormality. IMPRESSION: 1. Left basilar atelectasis versus infiltrate with tiny bilateral pleural effusions. 2. Cardiomegaly. Electronically signed by: Camellia Candle MD 03/26/2024 10:35 AM EST RP Workstation: [id number]   DG Pelvis 1-2 Views Result Date: 03/25/2024 EXAM: 1 OR 2 VIEW(S) XRAY OF THE PELVIS 03/25/2024 05:19:00 PM COMPARISON: None available. CLINICAL HISTORY: fall, pain FINDINGS: BONES AND JOINTS: Significant soft tissue artifact overlying the lower pelvis. No definite pubic rami fractures. The pubic symphysis and si joints are intact. No definite hip fractures. Advanced vascular disease. Degenerative changes of the lower lumbar spine. No focal osseous lesion. SOFT TISSUES: Vascular calcifications. IMPRESSION: 1. No acute hip or pelvic fractures 2. Atherosclerotic vascular calcifications. Electronically signed by: Maude Stammer MD 03/25/2024 06:07 PM EST RP Workstation: [id number]   CT HEAD WO CONTRAST ( ) Result Date: 03/25/2024 CLINICAL DATA:  Head and neck trauma fell in bathroom EXAM: CT HEAD WITHOUT CONTRAST CT CERVICAL SPINE WITHOUT CONTRAST TECHNIQUE: Multidetector CT imaging of the head and cervical spine was performed following the standard protocol without  intravenous contrast. Multiplanar CT image reconstructions of the cervical spine were also generated. RADIATION DOSE REDUCTION: This exam was performed according to the departmental dose-optimization program which includes automated exposure control, adjustment of the mA and/or kV according to patient size and/or use of iterative reconstruction technique. COMPARISON:  CT brain 02/18/2024, MRI 02/01/2024, CT brain and cervical spine 01/14/2024 FINDINGS: CT HEAD FINDINGS Brain: No acute territorial infarction, hemorrhage or intracranial mass. Minimal encephalomalacia at the right temporal occipital junction at the site of prior hemorrhage. Atrophy and mild chronic small vessel ischemic changes of the white matter. Nonenlarged ventricles Vascular: No hyperdense vessels.  Carotid vascular calcification Skull: Normal. Negative for fracture or focal lesion. Sinuses/Orbits: No acute finding. Other: Small right forehead scalp hematoma CT CERVICAL SPINE FINDINGS Alignment: Facet alignment within normal limits. Mild reversal of cervical lordosis. Trace retrolisthesis C6 on C7. Skull base and vertebrae: No acute fracture. No primary  bone lesion or focal pathologic process. Soft tissues and spinal canal: No prevertebral fluid or swelling. No visible canal hematoma. Disc levels: Multilevel degenerative change. Advanced disc space narrowing C5-C6 and C6-C7. At least mild canal stenosis C5-C6 secondary to posterior disc osteophyte complex. Upper chest: No acute finding. Ten year right thyroid calcification, no specific imaging follow-up recommended. Other: Carotid vascular calcification IMPRESSION: 1. No CT evidence for acute intracranial abnormality. Atrophy and chronic small vessel ischemic changes of the white matter. Small right forehead scalp hematoma. 2. Reversal of cervical lordosis with degenerative changes. No acute osseous abnormality. Electronically Signed   By: Luke Bun M.D.   On: 03/25/2024 16:54   CT Cervical  Spine Wo Contrast Result Date: 03/25/2024 CLINICAL DATA:  Head and neck trauma fell in bathroom EXAM: CT HEAD WITHOUT CONTRAST CT CERVICAL SPINE WITHOUT CONTRAST TECHNIQUE: Multidetector CT imaging of the head and cervical spine was performed following the standard protocol without intravenous contrast. Multiplanar CT image reconstructions of the cervical spine were also generated. RADIATION DOSE REDUCTION: This exam was performed according to the departmental dose-optimization program which includes automated exposure control, adjustment of the mA and/or kV according to patient size and/or use of iterative reconstruction technique. COMPARISON:  CT brain 02/18/2024, MRI 02/01/2024, CT brain and cervical spine 01/14/2024 FINDINGS: CT HEAD FINDINGS Brain: No acute territorial infarction, hemorrhage or intracranial mass. Minimal encephalomalacia at the right temporal occipital junction at the site of prior hemorrhage. Atrophy and mild chronic small vessel ischemic changes of the white matter. Nonenlarged ventricles Vascular: No hyperdense vessels.  Carotid vascular calcification Skull: Normal. Negative for fracture or focal lesion. Sinuses/Orbits: No acute finding. Other: Small right forehead scalp hematoma CT CERVICAL SPINE FINDINGS Alignment: Facet alignment within normal limits. Mild reversal of cervical lordosis. Trace retrolisthesis C6 on C7. Skull base and vertebrae: No acute fracture. No primary bone lesion or focal pathologic process. Soft tissues and spinal canal: No prevertebral fluid or swelling. No visible canal hematoma. Disc levels: Multilevel degenerative change. Advanced disc space narrowing C5-C6 and C6-C7. At least mild canal stenosis C5-C6 secondary to posterior disc osteophyte complex. Upper chest: No acute finding. Ten year right thyroid calcification, no specific imaging follow-up recommended. Other: Carotid vascular calcification IMPRESSION: 1. No CT evidence for acute intracranial abnormality.  Atrophy and chronic small vessel ischemic changes of the white matter. Small right forehead scalp hematoma. 2. Reversal of cervical lordosis with degenerative changes. No acute osseous abnormality. Electronically Signed   By: Luke Bun M.D.   On: 03/25/2024 16:54    Scheduled Meds:  aspirin   81 mg Oral Daily   Chlorhexidine  Gluconate Cloth  6 each Topical Q0600   DULoxetine   30 mg Oral Daily   famotidine   10 mg Oral QHS   fluticasone  furoate-vilanterol  1 puff Inhalation Daily   heparin  injection (subcutaneous)  5,000 Units Subcutaneous Q8H   melatonin  2.5 mg Oral QHS   metoprolol  tartrate  12.5 mg Oral BID   pantoprazole   40 mg Oral Daily   sodium chloride  flush  3 mL Intravenous Q12H   traZODone   75 mg Oral QHS   Continuous Infusions:     LOS: 2 days  MDM: Patient is high risk for one or more organ failure.  They necessitate ongoing hospitalization for continued IV therapies and subsequent lab monitoring. Total time spent interpreting labs and vitals, reviewing the medical record, coordinating care amongst consultants and care team members, directly assessing and discussing care with the patient and/or family: 55 min  Mileigh Tilley, DO [facility]  To contact the attending physician between 7A-7P please use Epic Chat. To contact the covering physician during after hours 7P-7A, please review Amion.  03/27/2024, 2:52 PM   *This document has been created with the assistance of dictation software. Please excuse typographical errors. *

## 2024-03-27 NOTE — Progress Notes (Signed)
 Hemodialysis Note:  Received patient in bed to unit. Alert and oriented. Informed consent singed and in chart.  Treatment initiated: 0749 Treatment completed: 1122  Access used: Left AVF Access issues: None  Patient tolerated well. Transported back to room, alert without acute distress. Report given to patient's RN.  Total UF removed: 1 liter Medications given: Benadryl  25 mg IV, Zofran  4 mg IV, Tylenol  650 mg Tablet  Post HD weight: 52.6 kg  [facility]

## 2024-03-27 NOTE — Care Management Important Message (Signed)
 Important Message  Patient Details  Name: Katie [name] MRN: [id number] Date of Birth: November 09, 1955   Important Message Given:  Yes - Medicare IM     [name] 03/27/2024, 4:17 PM

## 2024-03-27 NOTE — Progress Notes (Signed)
 Progress Note  Patient Name: Katie [name] Date of Encounter: 03/27/2024  Primary Cardiologist: Darliss Structural Heart: [facility] Primary Electrophysiologist:  None  Subjective   Tolerated hemodialysis today without syncopal episode or session having cut short.  No chest pain or dyspnea.  We have reached back out to structural heart to discuss her clinical case with patient still felt to not be a TAVR candidate with palliative approach recommended.  Patient indicates that she would like a second opinion.  BP elevated in the 160s mmHg systolic.  Inpatient Medications    Scheduled Meds:  aspirin   81 mg Oral Daily   Chlorhexidine  Gluconate Cloth  6 each Topical Q0600   DULoxetine   30 mg Oral Daily   famotidine   10 mg Oral QHS   fluticasone  furoate-vilanterol  1 puff Inhalation Daily   heparin  injection (subcutaneous)  5,000 Units Subcutaneous Q8H   melatonin  2.5 mg Oral QHS   metoprolol  tartrate  12.5 mg Oral BID   pantoprazole   40 mg Oral Daily   sodium chloride  flush  3 mL Intravenous Q12H   traZODone   75 mg Oral QHS   Continuous Infusions:  PRN Meds: acetaminophen  **OR** acetaminophen , albuterol , hydrALAZINE , loperamide , midodrine , ondansetron  **OR** ondansetron  (ZOFRAN ) IV, oxyCODONE , topiramate    Vital Signs    Vitals:   03/27/24 1100 03/27/24 1122 03/27/24 1130 03/27/24 1155  BP: (!) 164/58 (!) 154/63 (!) 176/72 (!) 162/61  Pulse: 73 79 78 86  Resp: 14 17 19 20   Temp:  98.7 F (37.1 C)  98.1 F (36.7 C)  TempSrc:  Oral  Oral  SpO2: 100% 100% 100% 97%  Weight:  52.6 kg    Height:        Intake/Output Summary (Last 24 hours) at 03/27/2024 1339 Last data filed at 03/27/2024 1130 Gross per 24 hour  Intake 600 ml  Output 1100 ml  Net -500 ml   Filed Weights   03/27/24 0500 03/27/24 0734 03/27/24 1122  Weight: 53.7 kg 53.6 kg 52.6 kg    Telemetry    Sinus rhythm with rates in the 60s to 80s bpm with a 9 beat run of SVT - Personally Reviewed  ECG     No new tracings - Personally Reviewed  Physical Exam   GEN: No acute distress.  Contusion along the right side of her forehead. Neck: No JVD. Cardiac: RRR, III/VI systolic murmur RUSB, no rubs, or gallops.  Respiratory: Clear to auscultation bilaterally.  GI: Soft, nontender, non-distended.   MS: No edema; No deformity. Neuro:  Alert and oriented x 3; Nonfocal.  Psych: Normal affect.  Labs    Chemistry Recent Labs  Lab 03/25/24 1504 03/26/24 0204 03/27/24 0716  NA 141 138 135  K 3.9 3.8 3.9  CL 100 100 97*  CO2 30 31 27   GLUCOSE 76 85 111*  BUN 30* 39* 59*  CREATININE 2.13* 2.56* 3.79*  CALCIUM  9.1 8.4* 9.0  PROT 6.6  --   --   ALBUMIN  3.3*  --  2.9*  AST 50*  --   --   ALT 60*  --   --   ALKPHOS 121  --   --   BILITOT 0.6  --   --   GFRNONAA 25* 20* 13*  ANIONGAP 11 8 11      Hematology Recent Labs  Lab 03/25/24 1504 03/26/24 0204 03/27/24 0540  WBC 9.0 7.1 6.1  RBC 3.72* 3.06* 2.97*  HGB 12.4 10.1* 9.8*  HCT 39.7 32.0* 30.0*  MCV 106.7*  104.6* 101.0*  MCH 33.3 33.0 33.0  MCHC 31.2 31.6 32.7  RDW 13.9 13.7 13.4  PLT 103* 104* 105*    Cardiac EnzymesNo results for input(s): TROPONINI in the last 168 hours. No results for input(s): TROPIPOC in the last 168 hours.   BNPNo results for input(s): BNP, PROBNP in the last 168 hours.   DDimer No results for input(s): DDIMER in the last 168 hours.   Radiology    CT Angio Chest Pulmonary Embolism (PE) W or WO Contrast Result Date: 03/26/2024 IMPRESSION: 1. No pulmonary emboli. 2. 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 Recommend annual imaging followup by CTA or MRA. This recommendation follows 2010 ACCF/AHA/AATS/ACR/ASA/SCA/SCAI/SIR/STS/SVM Guidelines for the Diagnosis and Management of Patients with Thoracic Aortic Disease. Circulation. 2010; 121: Z733-z630. Aortic aneurysm NOS (ICD10-I71.9) 3. Chronic left lower lobe bronchiectasis and mucous plugging with associated atelectasis. 4.  Stable bilateral pleural and parenchymal scarring. 5. Stable cardiomegaly and small pericardial effusion. 6. Calcific coronary artery and aortic atherosclerosis. Aortic Atherosclerosis (ICD10-I70.0). Electronically Signed   By: Elspeth Bathe M.D.   On: 03/26/2024 11:40   DG Chest Port 1 View Result Date: 03/26/2024 IMPRESSION: 1. Left basilar atelectasis versus infiltrate with tiny bilateral pleural effusions. 2. Cardiomegaly. Electronically signed by: Camellia Candle MD 03/26/2024 10:35 AM EST RP Workstation: [id number]   DG Pelvis 1-2 Views Result Date: 03/25/2024 IMPRESSION: 1. No acute hip or pelvic fractures 2. Atherosclerotic vascular calcifications. Electronically signed by: Maude Stammer MD 03/25/2024 06:07 PM EST RP Workstation: [id number]   CT HEAD WO CONTRAST ( ) Result Date: 03/25/2024 IMPRESSION: 1. No CT evidence for acute intracranial abnormality. Atrophy and chronic small vessel ischemic changes of the white matter. Small right forehead scalp hematoma. 2. Reversal of cervical lordosis with degenerative changes. No acute osseous abnormality. Electronically Signed   By: Luke Bun M.D.   On: 03/25/2024 16:54   CT Cervical Spine Wo Contrast Result Date: 03/25/2024 IMPRESSION: 1. No CT evidence for acute intracranial abnormality. Atrophy and chronic small vessel ischemic changes of the white matter. Small right forehead scalp hematoma. 2. Reversal of cervical lordosis with degenerative changes. No acute osseous abnormality. Electronically Signed   By: Luke Bun M.D.   On: 03/25/2024 16:54    Cardiac Studies   Echo 03/26/2024: 1. Left ventricular ejection fraction, by estimation, is 60 to 65%. The  left ventricle has normal function. The left ventricle has no regional  wall motion abnormalities. There is moderate left ventricular hypertrophy.  Left ventricular diastolic  parameters are consistent with Grade I diastolic dysfunction (impaired  relaxation).   2. Right  ventricular systolic function is normal. The right ventricular  size is normal. There is severely elevated pulmonary artery systolic  pressure. The estimated right ventricular systolic pressure is 68.4 mmHg.   3. The mitral valve is normal in structure. Mild mitral valve  regurgitation. Moderate mitral stenosis. The mean mitral valve gradient is  10.0 mmHg. Moderate mitral annular calcification.   4. Tricuspid valve regurgitation is moderate.   5. The aortic valve is normal in structure. There is severe calcifcation  of the aortic valve. Aortic valve regurgitation is mild to moderate.  Severe aortic valve stenosis. Aortic valve area, by VTI measures 0.85 cm.  Aortic valve mean gradient measures  36.5 mmHg. Aortic valve Vmax measures 4.02 m/s.   6. The inferior vena cava is normal in size with greater than 50%  respiratory variability, suggesting right atrial pressure of 3 mmHg.  __________  Limited echo 01/14/2024: 1.  Left ventricular ejection fraction, by estimation, is 60 to 65%. Left  ventricular ejection fraction by 3D volume is 52 %. The left ventricle has  normal function. There is moderate left ventricular hypertrophy. Left  ventricular diastolic parameters  are indeterminate. The average left ventricular global longitudinal strain  is -20.0 %. The global longitudinal strain is normal.   2. Right ventricular systolic function is normal. The right ventricular  size is normal. Mildly increased right ventricular wall thickness. There  is severely elevated pulmonary artery systolic pressure. The estimated  right ventricular systolic pressure  is 69.5 mmHg.   3. Trivial mitral valve regurgitation. Moderate mitral stenosis;  transvalvular gradient likely accentuated by high-flow state. The mean  mitral valve gradient is 10.0 mmHg.   4. Tricuspid valve regurgitation is mild to moderate.   5. The aortic valve has an indeterminant number of cusps. There is  moderate calcification of the  aortic valve. There is severe thickening of  the aortic valve. Aortic valve regurgitation is moderate. Severe aortic  valve stenosis. Aortic valve area, by  VTI measures 0.71 cm. Aortic valve mean gradient measures 47.0 mmHg.   6. The inferior vena cava is normal in size with <50% respiratory  variability, suggesting right atrial pressure of 8 mmHg. ___________   [facility] 08/15/2023:    Very Small Caliber Diagonal Branches: 1st Diag lesion is 90% stenosed. 2nd Diag lesion is 80% stenosed.   Mid LAD lesion is 55% stenosed.   LV end diastolic pressure is normal.   There is severe aortic valve stenosis.   Anticipated discharge date to be determined.   Plan per discussion with primary cardiology team and nephrologist would be to have the patient be admitted under the hospitalist service.  She will need to have dialysis done while she is here and have titration of medications.   Recommend Aspirin  81mg  daily for moderate CAD.   Dominance: Codominant  POST-OPERATIVE DIAGNOSIS:   Severe heavily calcified aortic stenosis with mean gradient by cardiac catheterization of 40 to 50 mmHg (peak to peak gradient 59 mmHg) Single-vessel disease with a 60% LAD stenosis just after tandem very small caliber D1 and D2 (ostium of D1-D2 a roughly 90 and 80%); large codominant LCx with relatively no significant disease; moderate caliber codominant RCA with minimal disease in the PDA. RHC hemodynamics: RAP 1 mmHg, RV P-EDP 44/1-2 mmHg; PAP-mean 44/12-24 mmHg, PCWP mean 5 mmHg.;  Fick Cardiac Output-Index 6.41-3.87 with ao sat 93% and PA sat 70%. LV P-EDP 191/1-23 mmHg; AO P-MAP 140/61-95 mmHg Aortic AoV gradient P-P59 0.5 mmHg; mean AoV gradient estimated 45 to 50 mmHg     PT TO BE ADMITTED PER NEPHROLOGIST, DR LATEEF    Plan per discussion with primary cardiology team and nephrologist would be to have the patient be admitted under the hospitalist service.  She will need to have dialysis done while she is here and  have titration of medications.   Recommend Aspirin  81mg  daily for moderate CAD.   Will need outpatient structural heart team  Patient Profile     68 y.o. female with history of CAD, severe aortic stenosis, HFpEF, ESRD on HD (MWF), recurrent syncope with associated intracranial bleed in 01/2024, CVA, COPD with prior tobacco use, anemia of chronic disease, thrombocytopenia, MDD, and GERD who is being seen today for the evaluation of recurrent syncope at the request of Dr. Sherre.   Assessment & Plan    1. Recurrent syncope with known severe aortic stenosis and moderate mitral stenosis: -  Occurred after getting up for HD to void -Not felt to be a candidate for TAVR by structural heart team due to prior intracranial bleed (from fall/syncope - possibly related to aortic stenosis), COPD, and ESRD -She reports I want to live -We have reached back out to structural heart who feels like palliative care is the best approach with the patient not felt to be a candidate for TAVR  -Patient is interested in second opinion, would recommend she discuss this with her primary cardiologist in the outpatient setting - After discussion with the patient, we will undergo a trial of Lopressor  12.5 mg twice daily, monitor for today - Narrow therapeutic window    2. CAD involving the native coronary arteries with elevated high sensitivity troponin: - Currently without symptoms of angina - Mildly elevated - Recent LHC with moderate nonobstructive major epicardial stenosis as above - Likely related to demand from syncope with severe AS with known CAD and underlying ESRD as opposed to an acute thrombotic event  - ASA - No indication for heparin  gtt at this time, especially in the setting of recent intracranial bleed - No plans for inpatient ischemic testing at this time       For questions or updates, please contact [facility] Please consult [email or web address] for contact info under Cardiology/STEMI.     Signed, Bernardino Bring, PA-C [facility] Pager: [phone] 03/27/2024, 1:39 PM

## 2024-03-27 NOTE — Plan of Care (Signed)
  Problem: Education: Goal: Knowledge of condition and prescribed therapy will improve Outcome: Progressing   Problem: Cardiac: Goal: Will achieve and/or maintain adequate cardiac output Outcome: Progressing   Problem: Physical Regulation: Goal: Complications related to the disease process, condition or treatment will be avoided or minimized Outcome: Progressing   Problem: Education: Goal: Knowledge of General Education information will improve Description: Including pain rating scale, medication(s)/side effects and non-pharmacologic comfort measures Outcome: Progressing   Problem: Clinical Measurements: Goal: Ability to maintain clinical measurements within normal limits will improve Outcome: Progressing Goal: Will remain free from infection Outcome: Progressing Goal: Diagnostic test results will improve Outcome: Progressing Goal: Respiratory complications will improve Outcome: Progressing Goal: Cardiovascular complication will be avoided Outcome: Progressing   Problem: Activity: Goal: Risk for activity intolerance will decrease Outcome: Progressing   Problem: Nutrition: Goal: Adequate nutrition will be maintained Outcome: Progressing   Problem: Coping: Goal: Level of anxiety will decrease Outcome: Progressing   Problem: Elimination: Goal: Will not experience complications related to bowel motility Outcome: Progressing Goal: Will not experience complications related to urinary retention Outcome: Progressing   Problem: Pain Managment: Goal: General experience of comfort will improve and/or be controlled Outcome: Progressing   Problem: Safety: Goal: Ability to remain free from injury will improve Outcome: Progressing   Problem: Skin Integrity: Goal: Risk for impaired skin integrity will decrease Outcome: Progressing

## 2024-03-27 NOTE — Progress Notes (Signed)
 [facility]  ROUNDING NOTE   Subjective:   Katie [name]  is a 68 y.o.  female with past medical history of ESRD with hemodialysis, diabetes mellitus, COPD, and hypertension. Patient presents to ED after suffering a fall at the dialysis center. She is admitted for Elevated troponin [R79.89] NSTEMI (non-ST elevated myocardial infarction) (HCC) [I21.4] Syncope, unspecified syncope type [R55]  Patient is known to our practice and receives outpatient dialysis treatments at [facility] on a MWF schedule.  Patient seen and evaluated during dialysis   HEMODIALYSIS FLOWSHEET:  Blood Flow Rate (mL/min): 400 mL/min Arterial Pressure (mmHg): -178.38 mmHg Venous Pressure (mmHg): 214.16 mmHg TMP (mmHg): 4.24 mmHg Ultrafiltration Rate (mL/min): 480 mL/min Dialysate Flow Rate (mL/min): 300 ml/min  Tolerating treatment well Requesting Benadryl  for pruritus  Objective:  Vital signs in last 24 hours:  Temp:  [97.3 F (36.3 C)-98.7 F (37.1 C)] 98.7 F (37.1 C) (11/14 0734) Pulse Rate:  [71-84] 73 (11/14 1100) Resp:  [11-19] 14 (11/14 1100) BP: (129-168)/(51-75) 164/58 (11/14 1100) SpO2:  [95 %-100 %] 100 % (11/14 1100) Weight:  [53.6 kg-53.7 kg] 53.6 kg (11/14 0734)  Weight change: 1.854 kg Filed Weights   03/25/24 1710 03/27/24 0500 03/27/24 0734  Weight: 51.8 kg 53.7 kg 53.6 kg    Intake/Output: I/O last 3 completed shifts: In: 1140 [P.O.:1140] Out: 200 [Urine:200]   Intake/Output this shift:  No intake/output data recorded.  Physical Exam: General: NAD  Head: Normocephalic, atraumatic. Moist oral mucosal membranes  Eyes: Anicteric  Neck: Supple  Lungs:  Clear to auscultation  Heart: Regular rate and rhythm  Abdomen:  Soft, nontender  Extremities:  No peripheral edema.  Neurologic: Awake, alert, conversant  Skin: Warm,dry, Right temporal bruising, generalized bruising on extremities  Access: Lt upper AVF    Basic Metabolic Panel: Recent Labs  Lab  03/25/24 1504 03/26/24 0204 03/27/24 0716  NA 141 138 135  K 3.9 3.8 3.9  CL 100 100 97*  CO2 30 31 27   GLUCOSE 76 85 111*  BUN 30* 39* 59*  CREATININE 2.13* 2.56* 3.79*  CALCIUM  9.1 8.4* 9.0  PHOS  --   --  3.8    Liver Function Tests: Recent Labs  Lab 03/25/24 1504 03/27/24 0716  AST 50*  --   ALT 60*  --   ALKPHOS 121  --   BILITOT 0.6  --   PROT 6.6  --   ALBUMIN  3.3* 2.9*   No results for input(s): LIPASE, AMYLASE in the last 168 hours. No results for input(s): AMMONIA in the last 168 hours.  CBC: Recent Labs  Lab 03/25/24 1504 03/26/24 0204 03/27/24 0540  WBC 9.0 7.1 6.1  HGB 12.4 10.1* 9.8*  HCT 39.7 32.0* 30.0*  MCV 106.7* 104.6* 101.0*  PLT 103* 104* 105*    Cardiac Enzymes: No results for input(s): CKTOTAL, CKMB, CKMBINDEX, TROPONINI in the last 168 hours.  BNP: Invalid input(s): POCBNP  CBG: Recent Labs  Lab 03/27/24 0507  GLUCAP 94    Microbiology: Results for orders placed or performed during the hospital encounter of 03/25/24  Resp panel by RT-PCR (RSV, Flu A&B, Covid) Anterior Nasal Swab     Status: None   Collection Time: 03/25/24  6:53 PM   Specimen: Anterior Nasal Swab  Result Value Ref Range Status   SARS Coronavirus 2 by RT PCR NEGATIVE NEGATIVE Final    Comment: (NOTE) SARS-CoV-2 target nucleic acids are NOT DETECTED.  The SARS-CoV-2 RNA is generally detectable in upper respiratory  specimens during the acute phase of infection. The lowest concentration of SARS-CoV-2 viral copies this assay can detect is 138 copies/mL. A negative result does not preclude SARS-Cov-2 infection and should not be used as the sole basis for treatment or other patient management decisions. A negative result may occur with  improper specimen collection/handling, submission of specimen other than nasopharyngeal swab, presence of viral mutation(s) within the areas targeted by this assay, and inadequate number of viral copies(<138  copies/mL). A negative result must be combined with clinical observations, patient history, and epidemiological information. The expected result is Negative.  Fact Sheet for Patients:  bloggercourse.com  Fact Sheet for Healthcare Providers:  seriousbroker.it  This test is no t yet approved or cleared by the [place]  FDA and  has been authorized for detection and/or diagnosis of SARS-CoV-2 by FDA under an Emergency Use Authorization (EUA). This EUA will remain  in effect (meaning this test can be used) for the duration of the COVID-19 declaration under Section 564(b)(1) of the Act, 21 U.S.C.section 360bbb-3(b)(1), unless the authorization is terminated  or revoked sooner.       Influenza A by PCR NEGATIVE NEGATIVE Final   Influenza B by PCR NEGATIVE NEGATIVE Final    Comment: (NOTE) The Xpert Xpress SARS-CoV-2/FLU/RSV plus assay is intended as an aid in the diagnosis of influenza from Nasopharyngeal swab specimens and should not be used as a sole basis for treatment. Nasal washings and aspirates are unacceptable for Xpert Xpress SARS-CoV-2/FLU/RSV testing.  Fact Sheet for Patients: bloggercourse.com  Fact Sheet for Healthcare Providers: seriousbroker.it  This test is not yet approved or cleared by the [place]  FDA and has been authorized for detection and/or diagnosis of SARS-CoV-2 by FDA under an Emergency Use Authorization (EUA). This EUA will remain in effect (meaning this test can be used) for the duration of the COVID-19 declaration under Section 564(b)(1) of the Act, 21 U.S.C. section 360bbb-3(b)(1), unless the authorization is terminated or revoked.     Resp Syncytial Virus by PCR NEGATIVE NEGATIVE Final    Comment: (NOTE) Fact Sheet for Patients: bloggercourse.com  Fact Sheet for Healthcare  Providers: seriousbroker.it  This test is not yet approved or cleared by the [place]  FDA and has been authorized for detection and/or diagnosis of SARS-CoV-2 by FDA under an Emergency Use Authorization (EUA). This EUA will remain in effect (meaning this test can be used) for the duration of the COVID-19 declaration under Section 564(b)(1) of the Act, 21 U.S.C. section 360bbb-3(b)(1), unless the authorization is terminated or revoked.  Performed at [facility], [address] [place], [place] [address]     Coagulation Studies: Recent Labs    03/25/24 1747  LABPROT 13.3  INR 1.0    Urinalysis: No results for input(s): COLORURINE, LABSPEC, PHURINE, GLUCOSEU, HGBUR, BILIRUBINUR, KETONESUR, PROTEINUR, UROBILINOGEN, NITRITE, LEUKOCYTESUR in the last 72 hours.  Invalid input(s): APPERANCEUR    Imaging: CT Angio Chest Pulmonary Embolism (PE) W or WO Contrast Result Date: 03/26/2024 CLINICAL DATA:  Recurrent syncope after getting up from hemodialysis to void. Clinical concern for pulmonary embolism. No chest symptoms. EXAM: CT ANGIOGRAPHY CHEST WITH CONTRAST TECHNIQUE: Multidetector CT imaging of the chest was performed using the standard protocol during bolus administration of intravenous contrast. Multiplanar CT image reconstructions and MIPs were obtained to evaluate the vascular anatomy. RADIATION DOSE REDUCTION: This exam was performed according to the departmental dose-optimization program which includes automated exposure control, adjustment of the mA and/or kV according to patient size and/or use  of iterative reconstruction technique. CONTRAST:  75mL OMNIPAQUE  IOHEXOL  350 MG/ML SOLN COMPARISON:  01/14/2024 FINDINGS: Cardiovascular: Normally opacified pulmonary arteries with no pulmonary arterial filling defects seen. The ascending thoracic aorta continues to measure 4.1 cm in maximum diameter. Stable enlarged heart.  Stable small pericardial effusion with a maximum thickness of 11 mm. Atheromatous calcifications, including the coronary arteries and aorta. Mediastinum/Nodes: Multiple subcentimeter bilateral thyroid nodules. These do not need imaging follow-up. Fluid and air in a dilated proximal esophagus, previously filled with air. No enlarged lymph nodes. Lungs/Pleura: Chronic left lower lobe bronchiectasis and mucous plugging with associated atelectasis. No significant change in bilateral pleural and parenchymal scarring. Mild additional pleural based scarring or atelectasis bilaterally. No pleural fluid. Upper Abdomen: Atheromatous arterial calcifications. Musculoskeletal: Thoracic and lower cervical spine degenerative changes. Review of the MIP images confirms the above findings. IMPRESSION: 1. No pulmonary emboli. 2. 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 Recommend annual imaging followup by CTA or MRA. This recommendation follows 2010 ACCF/AHA/AATS/ACR/ASA/SCA/SCAI/SIR/STS/SVM Guidelines for the Diagnosis and Management of Patients with Thoracic Aortic Disease. Circulation. 2010; 121: Z733-z630. Aortic aneurysm NOS (ICD10-I71.9) 3. Chronic left lower lobe bronchiectasis and mucous plugging with associated atelectasis. 4. Stable bilateral pleural and parenchymal scarring. 5. Stable cardiomegaly and small pericardial effusion. 6. Calcific coronary artery and aortic atherosclerosis. Aortic Atherosclerosis (ICD10-I70.0). Electronically Signed   By: Elspeth Bathe M.D.   On: 03/26/2024 11:40   ECHOCARDIOGRAM COMPLETE Result Date: 03/26/2024    ECHOCARDIOGRAM REPORT   Patient Name:   [facility] Date of Exam: 03/26/2024 Medical Rec #:  [id number]    Height:       60.0 in Accession #:    [id number]   Weight:       114.3 lb Date of Birth:  Sep 20, 1955   BSA:          1.472 m Patient Age:    67 years     BP:           166/50 mmHg Patient Gender: F            HR:           77 bpm. Exam Location:  [facility] Procedure: 2D  Echo, Cardiac Doppler and Color Doppler (Both Spectral and Color            Flow Doppler were utilized during procedure). Indications:     NSTEMI  History:         Patient has prior history of Echocardiogram examinations, most                  recent 01/14/2024. CHF, COPD, Aortic Valve Disease; Risk                  Factors:Hypertension and Diabetes. CKD.  Sonographer:     Philomena Daring Referring Phys:  [id number] AMY N COX Diagnosing Phys: Evalene Lunger MD IMPRESSIONS  1. Left ventricular ejection fraction, by estimation, is 60 to 65%. The left ventricle has normal function. The left ventricle has no regional wall motion abnormalities. There is moderate left ventricular hypertrophy. Left ventricular diastolic parameters are consistent with Grade I diastolic dysfunction (impaired relaxation).  2. Right ventricular systolic function is normal. The right ventricular size is normal. There is severely elevated pulmonary artery systolic pressure. The estimated right ventricular systolic pressure is 68.4 mmHg.  3. The mitral valve is normal in structure. Mild mitral valve regurgitation. Moderate mitral stenosis. The mean mitral valve gradient is 10.0 mmHg. Moderate mitral annular calcification.  4. Tricuspid valve regurgitation is moderate.  5. The aortic valve is normal in structure. There is severe calcifcation of the aortic valve. Aortic valve regurgitation is mild to moderate. Severe aortic valve stenosis. Aortic valve area, by VTI measures 0.85 cm. Aortic valve mean gradient measures 36.5 mmHg. Aortic valve Vmax measures 4.02 m/s.  6. The inferior vena cava is normal in size with greater than 50% respiratory variability, suggesting right atrial pressure of 3 mmHg. FINDINGS  Left Ventricle: Left ventricular ejection fraction, by estimation, is 60 to 65%. The left ventricle has normal function. The left ventricle has no regional wall motion abnormalities. Strain was performed and the global longitudinal strain is  indeterminate. The left ventricular internal cavity size was normal in size. There is moderate left ventricular hypertrophy. Left ventricular diastolic parameters are consistent with Grade I diastolic dysfunction (impaired relaxation). Right Ventricle: The right ventricular size is normal. No increase in right ventricular wall thickness. Right ventricular systolic function is normal. There is severely elevated pulmonary artery systolic pressure. The tricuspid regurgitant velocity is 3.98 m/s, and with an assumed right atrial pressure of 5 mmHg, the estimated right ventricular systolic pressure is 68.4 mmHg. Left Atrium: Left atrial size was normal in size. Right Atrium: Right atrial size was normal in size. Pericardium: There is no evidence of pericardial effusion. Mitral Valve: The mitral valve is normal in structure. There is moderate calcification of the mitral valve leaflet(s). Moderate mitral annular calcification. Mild mitral valve regurgitation. Moderate mitral valve stenosis. MV peak gradient, 18.8 mmHg. The mean mitral valve gradient is 10.0 mmHg. Tricuspid Valve: The tricuspid valve is normal in structure. Tricuspid valve regurgitation is moderate . No evidence of tricuspid stenosis. Aortic Valve: The aortic valve is normal in structure. There is severe calcifcation of the aortic valve. Aortic valve regurgitation is mild to moderate. Severe aortic stenosis is present. Aortic valve mean gradient measures 36.5 mmHg. Aortic valve peak gradient measures 64.6 mmHg. Aortic valve area, by VTI measures 0.85 cm. Pulmonic Valve: The pulmonic valve was normal in structure. Pulmonic valve regurgitation is not visualized. No evidence of pulmonic stenosis. Aorta: The aortic root is normal in size and structure. Venous: The inferior vena cava is normal in size with greater than 50% respiratory variability, suggesting right atrial pressure of 3 mmHg. IAS/Shunts: No atrial level shunt detected by color flow Doppler.  Additional Comments: 3D was performed not requiring image post processing on an independent workstation and was indeterminate.  LEFT VENTRICLE PLAX 2D LVIDd:         3.60 cm   Diastology LVIDs:         2.30 cm   LV e' medial:    5.77 cm/s LV PW:         1.20 cm   LV E/e' medial:  22.5 LV IVS:        1.50 cm   LV e' lateral:   4.90 cm/s LVOT diam:     1.80 cm   LV E/e' lateral: 26.5 LV SV:         69 LV SV Index:   47 LVOT Area:     2.54 cm  RIGHT VENTRICLE             IVC RV Basal diam:  3.40 cm     IVC diam: 1.40 cm RV Mid diam:    2.70 cm RV S prime:     10.70 cm/s TAPSE (M-mode): 2.2 cm LEFT ATRIUM  Index        RIGHT ATRIUM           Index LA diam:        3.00 cm 2.04 cm/m   RA Area:     15.60 cm LA Vol (A2C):   51.5 ml 35.00 ml/m  RA Volume:   36.40 ml  24.74 ml/m LA Vol (A4C):   42.0 ml 28.54 ml/m LA Biplane Vol: 48.4 ml 32.89 ml/m  AORTIC VALVE AV Area (Vmax):    0.81 cm AV Area (Vmean):   0.80 cm AV Area (VTI):     0.85 cm AV Vmax:           402.00 cm/s AV Vmean:          281.000 cm/s AV VTI:            0.807 m AV Peak Grad:      64.6 mmHg AV Mean Grad:      36.5 mmHg LVOT Vmax:         128.00 cm/s LVOT Vmean:        87.900 cm/s LVOT VTI:          0.270 m LVOT/AV VTI ratio: 0.33  AORTA Ao Root diam: 2.90 cm MITRAL VALVE                TRICUSPID VALVE MV Area (PHT): 3.42 cm     TR Peak grad:   63.4 mmHg MV Area VTI:   1.49 cm     TR Vmax:        398.00 cm/s MV Peak grad:  18.8 mmHg MV Mean grad:  10.0 mmHg    SHUNTS MV Vmax:       2.17 m/s     Systemic VTI:  0.27 m MV Vmean:      152.0 cm/s   Systemic Diam: 1.80 cm MV Decel Time: 222 msec MV E velocity: 130.00 cm/s MV A velocity: 206.00 cm/s MV E/A ratio:  0.63 Evalene Lunger MD Electronically signed by Evalene Lunger MD Signature Date/Time: 03/26/2024/11:39:41 AM    Final    DG Chest Port 1 View Result Date: 03/26/2024 EXAM: 1 VIEW(S) XRAY OF THE CHEST 03/26/2024 08:50:18 AM COMPARISON: None available. CLINICAL HISTORY: Dyspnea.  FINDINGS: LINES, TUBES AND DEVICES: Telemetry leads overlie the chest. LUNGS AND PLEURA: Left base atelectasis or infiltrate. Tiny bilateral effusions. No pneumothorax. HEART AND MEDIASTINUM: Cardiopericardial silhouette is enlarged. BONES AND SOFT TISSUES: No acute bony abnormality. IMPRESSION: 1. Left basilar atelectasis versus infiltrate with tiny bilateral pleural effusions. 2. Cardiomegaly. Electronically signed by: Camellia Candle MD 03/26/2024 10:35 AM EST RP Workstation: [id number]   DG Pelvis 1-2 Views Result Date: 03/25/2024 EXAM: 1 OR 2 VIEW(S) XRAY OF THE PELVIS 03/25/2024 05:19:00 PM COMPARISON: None available. CLINICAL HISTORY: fall, pain FINDINGS: BONES AND JOINTS: Significant soft tissue artifact overlying the lower pelvis. No definite pubic rami fractures. The pubic symphysis and si joints are intact. No definite hip fractures. Advanced vascular disease. Degenerative changes of the lower lumbar spine. No focal osseous lesion. SOFT TISSUES: Vascular calcifications. IMPRESSION: 1. No acute hip or pelvic fractures 2. Atherosclerotic vascular calcifications. Electronically signed by: Maude Stammer MD 03/25/2024 06:07 PM EST RP Workstation: [id number]   CT HEAD WO CONTRAST ( ) Result Date: 03/25/2024 CLINICAL DATA:  Head and neck trauma fell in bathroom EXAM: CT HEAD WITHOUT CONTRAST CT CERVICAL SPINE WITHOUT CONTRAST TECHNIQUE: Multidetector CT imaging of the head and cervical spine was performed following the standard protocol without intravenous  contrast. Multiplanar CT image reconstructions of the cervical spine were also generated. RADIATION DOSE REDUCTION: This exam was performed according to the departmental dose-optimization program which includes automated exposure control, adjustment of the mA and/or kV according to patient size and/or use of iterative reconstruction technique. COMPARISON:  CT brain 02/18/2024, MRI 02/01/2024, CT brain and cervical spine 01/14/2024 FINDINGS: CT HEAD  FINDINGS Brain: No acute territorial infarction, hemorrhage or intracranial mass. Minimal encephalomalacia at the right temporal occipital junction at the site of prior hemorrhage. Atrophy and mild chronic small vessel ischemic changes of the white matter. Nonenlarged ventricles Vascular: No hyperdense vessels.  Carotid vascular calcification Skull: Normal. Negative for fracture or focal lesion. Sinuses/Orbits: No acute finding. Other: Small right forehead scalp hematoma CT CERVICAL SPINE FINDINGS Alignment: Facet alignment within normal limits. Mild reversal of cervical lordosis. Trace retrolisthesis C6 on C7. Skull base and vertebrae: No acute fracture. No primary bone lesion or focal pathologic process. Soft tissues and spinal canal: No prevertebral fluid or swelling. No visible canal hematoma. Disc levels: Multilevel degenerative change. Advanced disc space narrowing C5-C6 and C6-C7. At least mild canal stenosis C5-C6 secondary to posterior disc osteophyte complex. Upper chest: No acute finding. Ten year right thyroid calcification, no specific imaging follow-up recommended. Other: Carotid vascular calcification IMPRESSION: 1. No CT evidence for acute intracranial abnormality. Atrophy and chronic small vessel ischemic changes of the white matter. Small right forehead scalp hematoma. 2. Reversal of cervical lordosis with degenerative changes. No acute osseous abnormality. Electronically Signed   By: Luke Bun M.D.   On: 03/25/2024 16:54   CT Cervical Spine Wo Contrast Result Date: 03/25/2024 CLINICAL DATA:  Head and neck trauma fell in bathroom EXAM: CT HEAD WITHOUT CONTRAST CT CERVICAL SPINE WITHOUT CONTRAST TECHNIQUE: Multidetector CT imaging of the head and cervical spine was performed following the standard protocol without intravenous contrast. Multiplanar CT image reconstructions of the cervical spine were also generated. RADIATION DOSE REDUCTION: This exam was performed according to the  departmental dose-optimization program which includes automated exposure control, adjustment of the mA and/or kV according to patient size and/or use of iterative reconstruction technique. COMPARISON:  CT brain 02/18/2024, MRI 02/01/2024, CT brain and cervical spine 01/14/2024 FINDINGS: CT HEAD FINDINGS Brain: No acute territorial infarction, hemorrhage or intracranial mass. Minimal encephalomalacia at the right temporal occipital junction at the site of prior hemorrhage. Atrophy and mild chronic small vessel ischemic changes of the white matter. Nonenlarged ventricles Vascular: No hyperdense vessels.  Carotid vascular calcification Skull: Normal. Negative for fracture or focal lesion. Sinuses/Orbits: No acute finding. Other: Small right forehead scalp hematoma CT CERVICAL SPINE FINDINGS Alignment: Facet alignment within normal limits. Mild reversal of cervical lordosis. Trace retrolisthesis C6 on C7. Skull base and vertebrae: No acute fracture. No primary bone lesion or focal pathologic process. Soft tissues and spinal canal: No prevertebral fluid or swelling. No visible canal hematoma. Disc levels: Multilevel degenerative change. Advanced disc space narrowing C5-C6 and C6-C7. At least mild canal stenosis C5-C6 secondary to posterior disc osteophyte complex. Upper chest: No acute finding. Ten year right thyroid calcification, no specific imaging follow-up recommended. Other: Carotid vascular calcification IMPRESSION: 1. No CT evidence for acute intracranial abnormality. Atrophy and chronic small vessel ischemic changes of the white matter. Small right forehead scalp hematoma. 2. Reversal of cervical lordosis with degenerative changes. No acute osseous abnormality. Electronically Signed   By: Luke Bun M.D.   On: 03/25/2024 16:54     Medications:     aspirin   81  mg Oral Daily   Chlorhexidine  Gluconate Cloth  6 each Topical Q0600   DULoxetine   30 mg Oral Daily   famotidine   10 mg Oral QHS   fluticasone   furoate-vilanterol  1 puff Inhalation Daily   heparin  injection (subcutaneous)  5,000 Units Subcutaneous Q8H   melatonin  2.5 mg Oral QHS   pantoprazole   40 mg Oral Daily   sodium chloride  flush  3 mL Intravenous Q12H   traZODone   75 mg Oral QHS   acetaminophen  **OR** acetaminophen , albuterol , hydrALAZINE , loperamide , midodrine , ondansetron  **OR** ondansetron  (ZOFRAN ) IV, oxyCODONE , topiramate   Assessment/ Plan:  Katie [name] is a 68 y.o.  female with past medical history of ESRD with hemodialysis, diabetes mellitus, COPD, and hypertension. Patient presents to ED after suffering a fall at the dialysis center. She is admitted for Elevated troponin [R79.89] NSTEMI (non-ST elevated myocardial infarction) (HCC) [I21.4] Syncope, unspecified syncope type [R55]  [facility]/MWF/Lt AVF   End stage renal disease on hemodialysis.  Receiving dialysis treatment today, UF 1 L as tolerated.  Next treatment scheduled for Monday.  Will defer discharge plan to primary team.  2. Anemia of chronic kidney disease Lab Results  Component Value Date   HGB 9.8 (L) 03/27/2024    Hgb remains stable.  3. Secondary Hyperparathyroidism: with outpatient labs: PTH 304, phosphorus 4.9, calcium  8.3 on 03/23/24.   Lab Results  Component Value Date   PTH 70 (H) 05/12/2020   CALCIUM  9.0 03/27/2024   CAION 1.13 (L) 08/15/2023   PHOS 3.8 03/27/2024    Will continue to monitor bone minerals during this admission.  4. 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 Most recent hemoglobin A1c is 4.5 on 01/14/24.   Glucose acceptable today.     LOS: 2 Vermon Grays 11/14/202511:11 AM

## 2024-03-27 NOTE — TOC Initial Note (Signed)
 Transition of Care [facility]) - Initial/Assessment Note    Patient Details  Name: Katie [name] MRN: [id number] Date of Birth: May 13, 1956  Transition of Care [facility]) CM/SW Contact:    Lauraine JAYSON Carpen, LCSW Phone Number: 03/27/2024, 2:55 PM  Clinical Narrative:  CSW met with patient. No family at bedside. CSW introduced role and explained that discharge planning would be discussed. Patient confirmed she is a resident at [facility]. Plan is to return there at discharge. CSW is waiting on a call back from the facility. Will see if they can take her back over the weekend when they call back. Patient is active with [facility] for PT, OT, RN. No further concerns. CSW will continue to follow patient for support and facilitate return to [facility] once medically stable.              Expected Discharge Plan: Assisted Living Barriers to Discharge: Continued Medical Work up   Patient Goals and CMS Choice            Expected Discharge Plan and Services     Post Acute Care Choice: Resumption of Svcs/PTA Provider Living arrangements for the past 2 months: Assisted Living Facility                                      Prior Living Arrangements/Services Living arrangements for the past 2 months: Assisted Living Facility Lives with:: Facility Resident Patient language and need for interpreter reviewed:: Yes Do you feel safe going back to the place where you live?: Yes      Need for Family Participation in Patient Care: Yes (Comment) Care giver support system in place?: Yes (comment) Current home services: Home OT, Home PT, Home RN Criminal Activity/Legal Involvement Pertinent to Current Situation/Hospitalization: No - Comment as needed  Activities of Daily Living      Permission Sought/Granted Permission sought to share information with : Facility Industrial/product Designer granted to share information with : Yes, Verbal Permission Granted     Permission  granted to share info w AGENCY: [facility]        Emotional Assessment Appearance:: Appears stated age Attitude/Demeanor/Rapport: Engaged, Gracious Affect (typically observed): Accepting, Appropriate, Calm, Pleasant Orientation: : Oriented to Self, Oriented to Place, Oriented to  Time, Oriented to Situation Alcohol  / Substance Use: Not Applicable Psych Involvement: No (comment)  Admission diagnosis:  Elevated troponin [R79.89] NSTEMI (non-ST elevated myocardial infarction) (HCC) [I21.4] Syncope, unspecified syncope type [R55] Patient Active Problem List   Diagnosis Date Noted   Elevated troponin 03/26/2024   NSTEMI (non-ST elevated myocardial infarction) (HCC) 03/25/2024   Intraparenchymal hematoma of brain (HCC) 01/31/2024   Intracranial hematoma following injury, without loss of consciousness, initial encounter (HCC) 01/31/2024   Syncope 01/15/2024   Acute UTI 01/15/2024   Chest pain 01/15/2024   Head injury 01/15/2024   Sepsis due to pneumonia (HCC) 01/14/2024   GERD without esophagitis 01/14/2024   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   End-stage renal disease on hemodialysis (HCC) 01/14/2024   Chronic pain syndrome 08/26/2023   Pharmacologic therapy 08/26/2023   Disorder of skeletal system 08/26/2023   Problems influencing health status 08/26/2023   CHF (congestive heart failure) (HCC) 08/15/2023   Orthostatic hypotension 06/12/2022   Vomiting and diarrhea 06/12/2022   Cellulitis of left upper extremity 12/11/2021   Type II diabetes mellitus with renal manifestations (HCC)  12/11/2021   Anemia in ESRD (end-stage renal disease) (HCC) 12/11/2021   Elevated lactic acid level 12/11/2021   Chronic diastolic CHF (congestive heart failure) (HCC) 12/11/2021   Left arm cellulitis 11/09/2021   Adjustment disorder with mixed anxiety and depressed mood 11/07/2021   Aortic stenosis, severe 11/01/2021   Fecal impaction (HCC) 10/27/2021   Bent  bone fracture of ulna 10/24/2021   Hospital acquired PNA 10/24/2021   Sepsis (HCC) 10/23/2021   Cellulitis 10/23/2021   Right flank pain 09/23/2021   Iron deficiency anemia, unspecified 03/27/2021   Allergy, unspecified, initial encounter 03/22/2021   Anaphylactic shock, unspecified, initial encounter 03/22/2021   Unspecified jaundice 03/15/2021   Allergy status to penicillin 03/14/2021   Anemia in chronic kidney disease 03/14/2021   Chronic diastolic (congestive) heart failure (HCC) 03/14/2021   Dependence on renal dialysis 03/14/2021   Depression, unspecified 03/14/2021   Hypertensive heart and chronic kidney disease with heart failure and with stage 5 chronic kidney disease, or end stage renal disease (HCC) 03/14/2021   Personal history of nicotine  dependence 03/14/2021   Secondary hyperparathyroidism of renal origin 03/14/2021   Frequent falls 03/07/2021   Financial difficulty 03/07/2021   ESRD (end stage renal disease) on dialysis (HCC) 12/23/2020   Persistent headaches 06/27/2020   ATN (acute tubular necrosis)    Pulmonary hypertension, unspecified (HCC)    Chronic kidney disease (CKD), stage IV (severe) (HCC)    Pleural effusion    Acute heart failure with preserved ejection fraction (HFpEF) (HCC)    Pressure injury of skin 05/01/2020   Hyperkalemia    Volume overload 04/30/2020   Hypertension    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HCC)    Type 2 diabetes mellitus (HCC)    PCP:  Cordella Corning, FNP Pharmacy:   [facility] [address] [place] - [address] [place] [address] Phone: [phone] Fax: [phone]  [facility] [address] ([address]), [place] - [address] [place] ([place]) [place] [address] Phone: [phone] Fax: [phone]  [facility] - [place], [place] - [phone] [place] [address] Phone: [phone] Fax: [phone]  [facility] - [place], [place] - [address] [place] [address] Phone: [phone] Fax: [phone]     Social Drivers of Health (SDOH) Social History: SDOH Screenings   Food Insecurity: No Food Insecurity (03/26/2024)  Housing: Low Risk  (03/26/2024)  Transportation Needs: No Transportation Needs (03/26/2024)  Utilities: Not At Risk (03/26/2024)  Social Connections: Socially Isolated (02/01/2024)  Tobacco Use: Medium Risk (03/25/2024)   SDOH Interventions:     Readmission Risk Interventions    03/27/2024    2:53 PM 01/16/2024   10:27 AM 06/13/2022    4:02 PM  Readmission Risk Prevention Plan  Transportation Screening Complete  Complete  Medication Review (RN Care Manager) Complete  Complete  PCP or Specialist appointment within 3-5 days of discharge Complete Complete   HRI or Home Care Consult Complete  Complete  SW Recovery Care/Counseling Consult Complete Complete   Palliative Care Screening Not Applicable Not Applicable Not Applicable  Skilled Nursing Facility Not Applicable Not Applicable Not Applicable

## 2024-03-28 DIAGNOSIS — N186 End stage renal disease: Secondary | ICD-10-CM | POA: Diagnosis not present

## 2024-03-28 DIAGNOSIS — I214 Non-ST elevation (NSTEMI) myocardial infarction: Secondary | ICD-10-CM | POA: Diagnosis not present

## 2024-03-28 DIAGNOSIS — R7989 Other specified abnormal findings of blood chemistry: Secondary | ICD-10-CM | POA: Diagnosis not present

## 2024-03-28 DIAGNOSIS — R55 Syncope and collapse: Secondary | ICD-10-CM | POA: Diagnosis not present

## 2024-03-28 LAB — CBC
HCT: 31.9 % — ABNORMAL LOW (ref 36.0–46.0)
Hemoglobin: 10.4 g/dL — ABNORMAL LOW (ref 12.0–15.0)
MCH: 33.3 pg (ref 26.0–34.0)
MCHC: 32.6 g/dL (ref 30.0–36.0)
MCV: 102.2 fL — ABNORMAL HIGH (ref 80.0–100.0)
Platelets: 113 K/uL — ABNORMAL LOW (ref 150–400)
RBC: 3.12 MIL/uL — ABNORMAL LOW (ref 3.87–5.11)
RDW: 13.1 % (ref 11.5–15.5)
WBC: 6 K/uL (ref 4.0–10.5)
nRBC: 0 % (ref 0.0–0.2)

## 2024-03-28 LAB — LIPID PANEL
Cholesterol: 94 mg/dL (ref 0–200)
HDL: 48 mg/dL (ref >40)
LDL Cholesterol: 37 mg/dL (ref 0–99)
Total CHOL/HDL Ratio: 2 ratio
Triglycerides: 45 mg/dL (ref <150)
VLDL: 9 mg/dL (ref 0–40)

## 2024-03-28 LAB — GLUCOSE, CAPILLARY: Glucose-Capillary: 103 mg/dL — ABNORMAL HIGH (ref 70–99)

## 2024-03-28 MED ORDER — FAMOTIDINE 20 MG PO TABS
20.0000 mg | ORAL_TABLET | Freq: Two times a day (BID) | ORAL | Status: DC | PRN
  Administered 2024-03-28: 20 mg via ORAL
  Filled 2024-03-28: qty 1

## 2024-03-28 NOTE — Evaluation (Addendum)
 Physical Therapy Evaluation & Discharge Patient Details Name: Katie [name] MRN: [id number] DOB: June 02, 1955 Today's Date: 03/28/2024  History of Present Illness  Pt is a 68 y/o F admitted on 03/25/24 after presenting with c/o syncope at dialysis. PMH: depression, anxiety, GERD, ESRD on HD, COPD, HTN, chronic pain  Clinical Impression  Pt seen for PT evaluation with pt agreeable after finishing breakfast. Pt reports prior to admission she performed transfers to/from w/c with staff assistance, pt uses bed pan 2/2 falling off toilet 2/2 low BP. On this date, pt is able to complete transfers with CGA, toileted with continent void. At this time, pt appears to be at baseline level of mobility, does not require acute PT services. PT to complete current orders at this time, please re-consult if new needs arise.        If plan is discharge home, recommend the following: A little help with walking and/or transfers;A little help with bathing/dressing/bathroom;Assistance with cooking/housework;Assist for transportation;Help with stairs or ramp for entrance   Can travel by private vehicle        Equipment Recommendations None recommended by PT  Recommendations for Other Services       Functional Status Assessment Patient has had a recent decline in their functional status and demonstrates the ability to make significant improvements in function in a reasonable and predictable amount of time.     Precautions / Restrictions Precautions Precautions: Fall Precaution/Restrictions Comments: watch BP Restrictions Weight Bearing Restrictions Per Provider Order: No      Mobility  Bed Mobility Overal bed mobility: Needs Assistance Bed Mobility: Supine to Sit     Supine to sit: Contact guard, HOB elevated, Used rails (exit R side of bed)          Transfers Overall transfer level: Needs assistance Equipment used: 1 person hand held assist Transfers: Sit to/from Stand, Bed to  chair/wheelchair/BSC Sit to Stand: Contact guard assist, Min assist   Step pivot transfers: Contact guard assist       General transfer comment: Pt transferred bed>BSC on L via stand pivot, BSC>recliner without AD via step pivot.    Ambulation/Gait                  Stairs            Wheelchair Mobility     Tilt Bed    Modified Rankin (Stroke Patients Only)       Balance Overall balance assessment: Needs assistance Sitting-balance support: Feet supported Sitting balance-Leahy Scale: Good     Standing balance support: Single extremity supported, During functional activity Standing balance-Leahy Scale: Fair                               Pertinent Vitals/Pain Pain Assessment Pain Assessment: Faces Faces Pain Scale: Hurts little more Pain Location: head, all over, and my butt Pain Descriptors / Indicators: Discomfort Pain Intervention(s): Monitored during session    Home Living Family/patient expects to be discharged to:: Assisted living                 Home Equipment: Wheelchair - Forensic Psychologist (2 wheels) Additional Comments: Lives at [facility]    Prior Function Prior Level of Function : Needs assist             Mobility Comments: staff assist with transfer to wheelchair ADLs Comments: Pt reports performing dressing herself from bed level and staff assisting with all  other ADLs inclusing bed pan only from bed secondary to fall risk     Extremity/Trunk Assessment   Upper Extremity Assessment Upper Extremity Assessment: Overall WFL for tasks assessed;Generalized weakness    Lower Extremity Assessment Lower Extremity Assessment: Generalized weakness;Overall WFL for tasks assessed       Communication   Communication Communication: No apparent difficulties    Cognition Arousal: Alert Behavior During Therapy: WFL for tasks assessed/performed   PT - Cognitive impairments: No apparent impairments,  No family/caregiver present to determine baseline                       PT - Cognition Comments: requires max encouragement re: participation/OOB mobility, pt agreeable after eating breakfast Following commands: Intact       Cueing Cueing Techniques: Verbal cues     General Comments General comments (skin integrity, edema, etc.): Pt with continent void with extra time on [facility], performs peri hygiene without assistance.    Exercises     Assessment/Plan    PT Assessment Patient does not need any further PT services  PT Problem List         PT Treatment Interventions      PT Goals (Current goals can be found in the Care Plan section)  Acute Rehab PT Goals Patient Stated Goal: none stated PT Goal Formulation: With patient Time For Goal Achievement: 04/11/24 Potential to Achieve Goals: Good    Frequency       Co-evaluation               AM-PAC PT 6 Clicks Mobility  Outcome Measure Help needed turning from your back to your side while in a flat bed without using bedrails?: None Help needed moving from lying on your back to sitting on the side of a flat bed without using bedrails?: A Little Help needed moving to and from a bed to a chair (including a wheelchair)?: A Little Help needed standing up from a chair using your arms (e.g., wheelchair or bedside chair)?: A Little Help needed to walk in hospital room?: A Little Help needed climbing 3-5 steps with a railing? : A Lot 6 Click Score: 18    End of Session   Activity Tolerance: Patient tolerated treatment well Patient left: in chair;with chair alarm set;with call bell/phone within reach Nurse Communication: Mobility status      Time: 939-945 and 1010-1028 PT Time Calculation (min) (ACUTE ONLY): 6 minutes and 18 min = 24 minutes   Charges:   PT Evaluation $PT Eval Low Complexity: 1 Low   PT General Charges $$ ACUTE PT VISIT: 1 Visit         Katie [name], PT, DPT 03/28/24, 10:46  AM   Katie [name] 03/28/2024, 10:44 AM

## 2024-03-28 NOTE — Progress Notes (Signed)
 PROGRESS NOTE    Katie [name]  FMW:[id number] DOB: November 05, 1955 DOA: 03/25/2024 PCP: Cordella Corning, FNP  Chief Complaint  Patient presents with   Loss of Consciousness    Hospital Course:  Katie [name] is a 68 year old female with depression, anxiety, GERD, ESRD on HD, COPD, hypertension, who presents to the ED with syncope at dialysis.  Patient was at dialysis when she got up to use the restroom and had syncopal event.  On arrival to ED she is mildly tachycardic to 104, vitals otherwise stable.  High-sensitivity troponin 289, AST 50, ALT 60, platelets 103.  She was given aspirin  324, started on heparin  bolus and drip.  She was admitted for further workup.  Only etiology of syncope appears to be severe aortic stenosis.  She was determined not to be a TAVR candidate and was recommended for hospice.  She is not interested in this recommendation and is pursuing a second opinion.  Medications were titrated and patient was determined to be ready for discharge.  Discharge then delayed due to difficulty securing return to assisted living facility  Subjective: Remains anxious to return home.  We discussed that she will need to remain in her wheelchair until she is able to get her valve fixed as she is likely to suffer falls again in the future.  She agrees and endorses understanding.  Objective: Vitals:   03/28/24 0500 03/28/24 0811 03/28/24 1158 03/28/24 1551  BP:  (!) 118/54 (!) 127/54 (!) 133/51  Pulse:  65 65 66  Resp:  16 16 16   Temp:  98.2 F (36.8 C) 98.2 F (36.8 C) 98.3 F (36.8 C)  TempSrc:      SpO2:  97% 98% 98%  Weight: 57 kg     Height:        Intake/Output Summary (Last 24 hours) at 03/28/2024 1747 Last data filed at 03/28/2024 1045 Gross per 24 hour  Intake 120 ml  Output --  Net 120 ml   Filed Weights   03/27/24 0734 03/27/24 1122 03/28/24 0500  Weight: 53.6 kg 52.6 kg 57 kg    Examination: General exam: Appears calm and comfortable, NAD  Respiratory system: No  work of breathing, symmetric chest wall expansion Cardiovascular system: Significant systolic murmur Gastrointestinal system: Abdomen is nondistended, soft and nontender.  Neuro: Alert and oriented. No focal neurological deficits. Extremities: Symmetric, expected ROM Skin: No rashes, lesions Psychiatry: Demonstrates appropriate judgement and insight. Mood & affect appropriate for situation.   Assessment & Plan:  Principal Problem:   NSTEMI (non-ST elevated myocardial infarction) (HCC) Active Problems:   Hypertension   Type 2 diabetes mellitus (HCC)   ESRD (end stage renal disease) on dialysis (HCC)   Severe aortic valve stenosis   Adjustment disorder with mixed anxiety and depressed mood   Chronic pain syndrome   Elevated troponin   Moderate mitral valve stenosis    Troponin elevation - Initially thought to be NSTEMI, cardiology now believes troponin elevation is likely secondary to demand ischemia - Has had recent coronary evaluation which was found to be predominantly in small caliber vessels not amenable to PCI. - Was briefly on heparin  drip - Echo EF 60 to 65%, moderate LVH, no RWMA, moderate mitral stenosis, aortic valve has severe calcification with severe stenosis. - Cardiology consulted  Syncopal event - May have been vasovagal given fluid shift during dialysis, likely complicated by severe aortic stenosis - CTA without pulmonary emboli - Ultimately does need TAVR - Does have orthostatic changes but does not meet  criteria for orthostatic hypotension  Severe aortic stenosis - Was planning for TAVR outpatient but due to recent fall and poor functional status this has been canceled.  This was reevaluated today with the structural heart team via cardiology and they feel she is not a TAVR candidate and recommended palliative approach.  Patient has requested for second opinion - Cardiology has arranged for [facility] referral - Metoprolol  added 11/14 to see if this improves her  diastolic filling and patient monitored overnight.  No acute issues on telemetry - In the interim have advised patient that she should mobilize with wheelchair only. - PT/OT ordered.  She will require home health again at DC.  History of subdural hematoma and intraparenchymal hematoma - After mechanical fall in September - Has follow-up outpatient with neurosurgery - CT head on arrival without acute intracranial abnormality  ESRD - Nephrology consulted, HD on routine schedule now  Type 2 diabetes - Sliding scale insulin  titrate as needed  Hypertension - Resume home meds  Chronic pain syndrome - Home dose oxycodone  resumed  Adjustment disorder with mixed anxiety and depressed mood - Resume home meds  Ascending thoracic aortic aneurysm - Seen on CTA.  4.1 cm.  Stable. - Needs annual outpatient imaging follow-up.  DVT prophylaxis: Heparin    Code Status: Limited: Do not attempt resuscitation (DNR) -DNR-LIMITED -Do Not Intubate/DNI  Disposition: Medically ready for discharge pending return to [facility]  Consultants:  Treatment Team:  Consulting Physician: Perla Evalene PARAS, MD  Procedures:    Antimicrobials:  Anti-infectives (From admission, onward)    None       Data Reviewed: I have personally reviewed following labs and imaging studies CBC: Recent Labs  Lab 03/25/24 1504 03/26/24 0204 03/27/24 0540 03/28/24 0541  WBC 9.0 7.1 6.1 6.0  HGB 12.4 10.1* 9.8* 10.4*  HCT 39.7 32.0* 30.0* 31.9*  MCV 106.7* 104.6* 101.0* 102.2*  PLT 103* 104* 105* 113*   Basic Metabolic Panel: Recent Labs  Lab 03/25/24 1504 03/26/24 0204 03/27/24 0716  NA 141 138 135  K 3.9 3.8 3.9  CL 100 100 97*  CO2 30 31 27   GLUCOSE 76 85 111*  BUN 30* 39* 59*  CREATININE 2.13* 2.56* 3.79*  CALCIUM  9.1 8.4* 9.0  PHOS  --   --  3.8   GFR: Estimated Creatinine Clearance: 11.7 mL/min (A) (by C-G formula based on SCr of 3.79 mg/dL (H)). Liver Function Tests: Recent Labs  Lab  03/25/24 1504 03/27/24 0716  AST 50*  --   ALT 60*  --   ALKPHOS 121  --   BILITOT 0.6  --   PROT 6.6  --   ALBUMIN  3.3* 2.9*   CBG: Recent Labs  Lab 03/27/24 0507 03/28/24 0617  GLUCAP 94 103*    Recent Results (from the past 240 hours)  Resp panel by RT-PCR (RSV, Flu A&B, Covid) Anterior Nasal Swab     Status: None   Collection Time: 03/25/24  6:53 PM   Specimen: Anterior Nasal Swab  Result Value Ref Range Status   SARS Coronavirus 2 by RT PCR NEGATIVE NEGATIVE Final    Comment: (NOTE) SARS-CoV-2 target nucleic acids are NOT DETECTED.  The SARS-CoV-2 RNA is generally detectable in upper respiratory specimens during the acute phase of infection. The lowest concentration of SARS-CoV-2 viral copies this assay can detect is 138 copies/mL. A negative result does not preclude SARS-Cov-2 infection and should not be used as the sole basis for treatment or other patient management decisions. A  negative result may occur with  improper specimen collection/handling, submission of specimen other than nasopharyngeal swab, presence of viral mutation(s) within the areas targeted by this assay, and inadequate number of viral copies(<138 copies/mL). A negative result must be combined with clinical observations, patient history, and epidemiological information. The expected result is Negative.  Fact Sheet for Patients:  bloggercourse.com  Fact Sheet for Healthcare Providers:  seriousbroker.it  This test is no t yet approved or cleared by the [place]  FDA and  has been authorized for detection and/or diagnosis of SARS-CoV-2 by FDA under an Emergency Use Authorization (EUA). This EUA will remain  in effect (meaning this test can be used) for the duration of the COVID-19 declaration under Section 564(b)(1) of the Act, 21 U.S.C.section 360bbb-3(b)(1), unless the authorization is terminated  or revoked sooner.       Influenza A  by PCR NEGATIVE NEGATIVE Final   Influenza B by PCR NEGATIVE NEGATIVE Final    Comment: (NOTE) The Xpert Xpress SARS-CoV-2/FLU/RSV plus assay is intended as an aid in the diagnosis of influenza from Nasopharyngeal swab specimens and should not be used as a sole basis for treatment. Nasal washings and aspirates are unacceptable for Xpert Xpress SARS-CoV-2/FLU/RSV testing.  Fact Sheet for Patients: bloggercourse.com  Fact Sheet for Healthcare Providers: seriousbroker.it  This test is not yet approved or cleared by the [place]  FDA and has been authorized for detection and/or diagnosis of SARS-CoV-2 by FDA under an Emergency Use Authorization (EUA). This EUA will remain in effect (meaning this test can be used) for the duration of the COVID-19 declaration under Section 564(b)(1) of the Act, 21 U.S.C. section 360bbb-3(b)(1), unless the authorization is terminated or revoked.     Resp Syncytial Virus by PCR NEGATIVE NEGATIVE Final    Comment: (NOTE) Fact Sheet for Patients: bloggercourse.com  Fact Sheet for Healthcare Providers: seriousbroker.it  This test is not yet approved or cleared by the [place]  FDA and has been authorized for detection and/or diagnosis of SARS-CoV-2 by FDA under an Emergency Use Authorization (EUA). This EUA will remain in effect (meaning this test can be used) for the duration of the COVID-19 declaration under Section 564(b)(1) of the Act, 21 U.S.C. section 360bbb-3(b)(1), unless the authorization is terminated or revoked.  Performed at [facility], [address], [place], [place] [address]      Radiology Studies: No results found.   Scheduled Meds:  aspirin   81 mg Oral Daily   Chlorhexidine  Gluconate Cloth  6 each Topical Q0600   DULoxetine   30 mg Oral Daily   famotidine   10 mg Oral QHS   fluticasone  furoate-vilanterol  1  puff Inhalation Daily   heparin  injection (subcutaneous)  5,000 Units Subcutaneous Q8H   melatonin  2.5 mg Oral QHS   metoprolol  tartrate  12.5 mg Oral BID   pantoprazole   40 mg Oral Daily   sodium chloride  flush  3 mL Intravenous Q12H   traZODone   75 mg Oral QHS   Continuous Infusions:     LOS: 3 days  MDM: Patient is high risk for one or more organ failure.  They necessitate ongoing hospitalization for continued IV therapies and subsequent lab monitoring. Total time spent interpreting labs and vitals, reviewing the medical record, coordinating care amongst consultants and care team members, directly assessing and discussing care with the patient and/or family: 55 min   Samanyu Tinnell, DO [facility]  To contact the attending physician between 7A-7P please use Epic Chat. To contact the  covering physician during after hours 7P-7A, please review Amion.  03/28/2024, 5:47 PM   *This document has been created with the assistance of dictation software. Please excuse typographical errors. *

## 2024-03-28 NOTE — Plan of Care (Signed)
  Problem: Clinical Measurements: Goal: Will remain free from infection Outcome: Progressing Goal: Respiratory complications will improve Outcome: Progressing Goal: Cardiovascular complication will be avoided Outcome: Progressing   Problem: Nutrition: Goal: Adequate nutrition will be maintained Outcome: Progressing   Problem: Coping: Goal: Level of anxiety will decrease Outcome: Progressing   Problem: Pain Managment: Goal: General experience of comfort will improve and/or be controlled Outcome: Progressing   Problem: Safety: Goal: Ability to remain free from injury will improve Outcome: Progressing   Problem: Skin Integrity: Goal: Risk for impaired skin integrity will decrease Outcome: Progressing

## 2024-03-28 NOTE — Progress Notes (Signed)
 Progress Note  Patient Name: Katie [name] Date of Encounter: 03/28/2024  Primary Cardiologist: Darliss Structural Heart: [place] Primary Electrophysiologist:  None  Subjective   No chest pain or dyspnea.  We have reached back out to structural heart team this admission to discuss her clinical case with patient still felt to not be a TAVR candidate with palliative approach recommended.  Patient wants a second opinion.  Underwent trial of low dose Lopressor  12.5 mg bid on 11/14, and reports she tolerated this well. BP labile. She is hopeful for discharge today.   Inpatient Medications    Scheduled Meds:  aspirin   81 mg Oral Daily   Chlorhexidine  Gluconate Cloth  6 each Topical Q0600   DULoxetine   30 mg Oral Daily   famotidine   10 mg Oral QHS   fluticasone  furoate-vilanterol  1 puff Inhalation Daily   heparin  injection (subcutaneous)  5,000 Units Subcutaneous Q8H   melatonin  2.5 mg Oral QHS   metoprolol  tartrate  12.5 mg Oral BID   pantoprazole   40 mg Oral Daily   sodium chloride  flush  3 mL Intravenous Q12H   traZODone   75 mg Oral QHS   Continuous Infusions:  PRN Meds: acetaminophen  **OR** acetaminophen , albuterol , hydrALAZINE , loperamide , midodrine , ondansetron  **OR** ondansetron  (ZOFRAN ) IV, oxyCODONE , topiramate    Vital Signs    Vitals:   03/28/24 0038 03/28/24 0442 03/28/24 0500 03/28/24 0811  BP: (!) 159/63 (!) 161/62  (!) 118/54  Pulse: 79 73  65  Resp: 19 17  16   Temp: 99.3 F (37.4 C) 98.6 F (37 C)  98.2 F (36.8 C)  TempSrc:      SpO2: 96% 95%  97%  Weight:   57 kg   Height:        Intake/Output Summary (Last 24 hours) at 03/28/2024 0853 Last data filed at 03/27/2024 1130 Gross per 24 hour  Intake --  Output 1000 ml  Net -1000 ml   Filed Weights   03/27/24 0734 03/27/24 1122 03/28/24 0500  Weight: 53.6 kg 52.6 kg 57 kg    Telemetry    Sinus rhythm with rates in the 60s to 80s bpm with a rare isolated PVC - Personally Reviewed  ECG     No new tracings - Personally Reviewed  Physical Exam   GEN: No acute distress.  Contusion along the right side of her forehead. Neck: No JVD. Cardiac: RRR, III/VI systolic murmur RUSB, no rubs, or gallops.  Respiratory: Clear to auscultation bilaterally.  GI: Soft, nontender, non-distended.   MS: No edema; No deformity. Neuro:  Alert and oriented x 3; Nonfocal.  Psych: Normal affect.  Labs    Chemistry Recent Labs  Lab 03/25/24 1504 03/26/24 0204 03/27/24 0716  NA 141 138 135  K 3.9 3.8 3.9  CL 100 100 97*  CO2 30 31 27   GLUCOSE 76 85 111*  BUN 30* 39* 59*  CREATININE 2.13* 2.56* 3.79*  CALCIUM  9.1 8.4* 9.0  PROT 6.6  --   --   ALBUMIN  3.3*  --  2.9*  AST 50*  --   --   ALT 60*  --   --   ALKPHOS 121  --   --   BILITOT 0.6  --   --   GFRNONAA 25* 20* 13*  ANIONGAP 11 8 11      Hematology Recent Labs  Lab 03/26/24 0204 03/27/24 0540 03/28/24 0541  WBC 7.1 6.1 6.0  RBC 3.06* 2.97* 3.12*  HGB 10.1* 9.8* 10.4*  HCT 32.0*  30.0* 31.9*  MCV 104.6* 101.0* 102.2*  MCH 33.0 33.0 33.3  MCHC 31.6 32.7 32.6  RDW 13.7 13.4 13.1  PLT 104* 105* 113*    Cardiac EnzymesNo results for input(s): TROPONINI in the last 168 hours. No results for input(s): TROPIPOC in the last 168 hours.   BNPNo results for input(s): BNP, PROBNP in the last 168 hours.   DDimer No results for input(s): DDIMER in the last 168 hours.   Radiology    CT Angio Chest Pulmonary Embolism (PE) W or WO Contrast Result Date: 03/26/2024 IMPRESSION: 1. No pulmonary emboli. 2. 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 Recommend annual imaging followup by CTA or MRA. This recommendation follows 2010 ACCF/AHA/AATS/ACR/ASA/SCA/SCAI/SIR/STS/SVM Guidelines for the Diagnosis and Management of Patients with Thoracic Aortic Disease. Circulation. 2010; 121: Z733-z630. Aortic aneurysm NOS (ICD10-I71.9) 3. Chronic left lower lobe bronchiectasis and mucous plugging with associated atelectasis. 4.  Stable bilateral pleural and parenchymal scarring. 5. Stable cardiomegaly and small pericardial effusion. 6. Calcific coronary artery and aortic atherosclerosis. Aortic Atherosclerosis (ICD10-I70.0). Electronically Signed   By: Elspeth Bathe M.D.   On: 03/26/2024 11:40   DG Chest Port 1 View Result Date: 03/26/2024 IMPRESSION: 1. Left basilar atelectasis versus infiltrate with tiny bilateral pleural effusions. 2. Cardiomegaly. Electronically signed by: Camellia Candle MD 03/26/2024 10:35 AM EST RP Workstation: [id number]   DG Pelvis 1-2 Views Result Date: 03/25/2024 IMPRESSION: 1. No acute hip or pelvic fractures 2. Atherosclerotic vascular calcifications. Electronically signed by: Maude Stammer MD 03/25/2024 06:07 PM EST RP Workstation: [id number]   CT HEAD WO CONTRAST ( ) Result Date: 03/25/2024 IMPRESSION: 1. No CT evidence for acute intracranial abnormality. Atrophy and chronic small vessel ischemic changes of the white matter. Small right forehead scalp hematoma. 2. Reversal of cervical lordosis with degenerative changes. No acute osseous abnormality. Electronically Signed   By: Luke Bun M.D.   On: 03/25/2024 16:54   CT Cervical Spine Wo Contrast Result Date: 03/25/2024 IMPRESSION: 1. No CT evidence for acute intracranial abnormality. Atrophy and chronic small vessel ischemic changes of the white matter. Small right forehead scalp hematoma. 2. Reversal of cervical lordosis with degenerative changes. No acute osseous abnormality. Electronically Signed   By: Luke Bun M.D.   On: 03/25/2024 16:54    Cardiac Studies   Echo 03/26/2024: 1. Left ventricular ejection fraction, by estimation, is 60 to 65%. The  left ventricle has normal function. The left ventricle has no regional  wall motion abnormalities. There is moderate left ventricular hypertrophy.  Left ventricular diastolic  parameters are consistent with Grade I diastolic dysfunction (impaired  relaxation).   2. Right  ventricular systolic function is normal. The right ventricular  size is normal. There is severely elevated pulmonary artery systolic  pressure. The estimated right ventricular systolic pressure is 68.4 mmHg.   3. The mitral valve is normal in structure. Mild mitral valve  regurgitation. Moderate mitral stenosis. The mean mitral valve gradient is  10.0 mmHg. Moderate mitral annular calcification.   4. Tricuspid valve regurgitation is moderate.   5. The aortic valve is normal in structure. There is severe calcifcation  of the aortic valve. Aortic valve regurgitation is mild to moderate.  Severe aortic valve stenosis. Aortic valve area, by VTI measures 0.85 cm.  Aortic valve mean gradient measures  36.5 mmHg. Aortic valve Vmax measures 4.02 m/s.   6. The inferior vena cava is normal in size with greater than 50%  respiratory variability, suggesting right atrial pressure of 3 mmHg.  __________  Limited echo 01/14/2024: 1. Left ventricular ejection fraction, by estimation, is 60 to 65%. Left  ventricular ejection fraction by 3D volume is 52 %. The left ventricle has  normal function. There is moderate left ventricular hypertrophy. Left  ventricular diastolic parameters  are indeterminate. The average left ventricular global longitudinal strain  is -20.0 %. The global longitudinal strain is normal.   2. Right ventricular systolic function is normal. The right ventricular  size is normal. Mildly increased right ventricular wall thickness. There  is severely elevated pulmonary artery systolic pressure. The estimated  right ventricular systolic pressure  is 69.5 mmHg.   3. Trivial mitral valve regurgitation. Moderate mitral stenosis;  transvalvular gradient likely accentuated by high-flow state. The mean  mitral valve gradient is 10.0 mmHg.   4. Tricuspid valve regurgitation is mild to moderate.   5. The aortic valve has an indeterminant number of cusps. There is  moderate calcification of the  aortic valve. There is severe thickening of  the aortic valve. Aortic valve regurgitation is moderate. Severe aortic  valve stenosis. Aortic valve area, by  VTI measures 0.71 cm. Aortic valve mean gradient measures 47.0 mmHg.   6. The inferior vena cava is normal in size with <50% respiratory  variability, suggesting right atrial pressure of 8 mmHg. ___________   [facility] 08/15/2023:    Very Small Caliber Diagonal Branches: 1st Diag lesion is 90% stenosed. 2nd Diag lesion is 80% stenosed.   Mid LAD lesion is 55% stenosed.   LV end diastolic pressure is normal.   There is severe aortic valve stenosis.   Anticipated discharge date to be determined.   Plan per discussion with primary cardiology team and nephrologist would be to have the patient be admitted under the hospitalist service.  She will need to have dialysis done while she is here and have titration of medications.   Recommend Aspirin  81mg  daily for moderate CAD.   Dominance: Codominant  POST-OPERATIVE DIAGNOSIS:   Severe heavily calcified aortic stenosis with mean gradient by cardiac catheterization of 40 to 50 mmHg (peak to peak gradient 59 mmHg) Single-vessel disease with a 60% LAD stenosis just after tandem very small caliber D1 and D2 (ostium of D1-D2 a roughly 90 and 80%); large codominant LCx with relatively no significant disease; moderate caliber codominant RCA with minimal disease in the PDA. RHC hemodynamics: RAP 1 mmHg, RV P-EDP 44/1-2 mmHg; PAP-mean 44/12-24 mmHg, PCWP mean 5 mmHg.;  Fick Cardiac Output-Index 6.41-3.87 with ao sat 93% and PA sat 70%. LV P-EDP 191/1-23 mmHg; AO P-MAP 140/61-95 mmHg Aortic AoV gradient P-P59 0.5 mmHg; mean AoV gradient estimated 45 to 50 mmHg     PT TO BE ADMITTED PER NEPHROLOGIST, DR LATEEF    Plan per discussion with primary cardiology team and nephrologist would be to have the patient be admitted under the hospitalist service.  She will need to have dialysis done while she is here and  have titration of medications.   Recommend Aspirin  81mg  daily for moderate CAD.   Will need outpatient structural heart team  Patient Profile     68 y.o. female with history of CAD, severe aortic stenosis, HFpEF, ESRD on HD (MWF), recurrent syncope with associated intracranial bleed in 01/2024, CVA, COPD with prior tobacco use, anemia of chronic disease, thrombocytopenia, MDD, and GERD who is being seen today for the evaluation of recurrent syncope at the request of Dr. Sherre.   Assessment & Plan    1. Recurrent syncope with known severe aortic stenosis  and moderate mitral stenosis: -Suspected to be related to fluid shifts, particularly with HD -Not felt to be a candidate for TAVR by structural heart team due to prior intracranial bleed (from fall/syncope - possibly related to aortic stenosis), COPD, and ESRD -She reports I want to live -We have reached back out to structural heart this admission who feels like palliative care is the best approach with the patient not felt to be a candidate for TAVR  -Patient is interested in second opinion, would recommend she discuss this with her primary cardiologist in the outpatient setting -At baseline, she is actually hypertensive -We underwent a trial of Lopressor  12.5 mg bid on 11/14 to lower her heart rate somewhat that may allow for more diastolic filling of the left ventricle in the setting of her mitral stenosis (mean gradient 10 mmHg on last echo) and potentially help improve her preload  -She has tolerated the addition of Lopressor  12.5 mg bid well, continue for now - Narrow therapeutic window    2. CAD involving the native coronary arteries with elevated high sensitivity troponin: - Currently without symptoms of angina - Mildly elevated - Recent LHC with moderate nonobstructive major epicardial stenosis as above - Likely related to demand from syncope with severe AS with known CAD and underlying ESRD as opposed to an acute thrombotic event  -  ASA - No indication for heparin  gtt at this time, especially in the setting of recent intracranial bleed - No plans for inpatient ischemic testing at this time   From a cardiac perspective she appears stable for discharge with recommendation to follow up with her primary cardiologist to discuss her wishes for referral to have a second opinion regarding her valvular heart disease.      For questions or updates, please contact [facility] Please consult [email or web address] for contact info under Cardiology/STEMI.    Signed, Bernardino Bring, PA-C [facility] Pager: [phone] 03/28/2024, 8:53 AM

## 2024-03-28 NOTE — Progress Notes (Signed)
 [facility]  PROGRESS NOTE   Subjective:   Patient seen at bedside.  Had stable dialysis yesterday.  Objective:  Vital signs: Blood pressure (!) 127/54, pulse 65, temperature 98.2 F (36.8 C), resp. rate 16, height 5' (1.524 m), weight 57 kg, SpO2 98%.  Intake/Output Summary (Last 24 hours) at 03/28/2024 1323 Last data filed at 03/28/2024 1045 Gross per 24 hour  Intake 120 ml  Output --  Net 120 ml   Filed Weights   03/27/24 0734 03/27/24 1122 03/28/24 0500  Weight: 53.6 kg 52.6 kg 57 kg     Physical Exam: General:  No acute distress  Head:  Normocephalic, atraumatic. Moist oral mucosal membranes  Eyes:  Anicteric  Neck:  Supple  Lungs:   Clear to auscultation, normal effort  Heart:  S1S2 no rubs  Abdomen:   Soft, nontender, bowel sounds present  Extremities:  peripheral edema.  Neurologic:  Awake, alert, following commands  Skin:  No lesions  Access:     Basic Metabolic Panel: Recent Labs  Lab 03/25/24 1504 03/26/24 0204 03/27/24 0716  NA 141 138 135  K 3.9 3.8 3.9  CL 100 100 97*  CO2 30 31 27   GLUCOSE 76 85 111*  BUN 30* 39* 59*  CREATININE 2.13* 2.56* 3.79*  CALCIUM  9.1 8.4* 9.0  PHOS  --   --  3.8   GFR: Estimated Creatinine Clearance: 11.7 mL/min (A) (by C-G formula based on SCr of 3.79 mg/dL (H)).  Liver Function Tests: Recent Labs  Lab 03/25/24 1504 03/27/24 0716  AST 50*  --   ALT 60*  --   ALKPHOS 121  --   BILITOT 0.6  --   PROT 6.6  --   ALBUMIN  3.3* 2.9*   No results for input(s): LIPASE, AMYLASE in the last 168 hours. No results for input(s): AMMONIA in the last 168 hours.  CBC: Recent Labs  Lab 03/25/24 1504 03/26/24 0204 03/27/24 0540 03/28/24 0541  WBC 9.0 7.1 6.1 6.0  HGB 12.4 10.1* 9.8* 10.4*  HCT 39.7 32.0* 30.0* 31.9*  MCV 106.7* 104.6* 101.0* 102.2*  PLT 103* 104* 105* 113*     HbA1C: Hgb A1c MFr Bld  Date/Time Value Ref Range Status  01/14/2024 04:52 AM 4.5 (L) 4.8 - 5.6 % Final     Comment:    (NOTE)         Prediabetes: 5.7 - 6.4         Diabetes: >6.4         Glycemic control for adults with diabetes: <7.0   09/23/2021 05:43 PM 4.7 (L) 4.8 - 5.6 % Final    Comment:    (NOTE) Pre diabetes:          5.7%-6.4%  Diabetes:              >6.4%  Glycemic control for   <7.0% adults with diabetes     Urinalysis: No results for input(s): COLORURINE, LABSPEC, PHURINE, GLUCOSEU, HGBUR, BILIRUBINUR, KETONESUR, PROTEINUR, UROBILINOGEN, NITRITE, LEUKOCYTESUR in the last 72 hours.  Invalid input(s): APPERANCEUR    Imaging: No results found.   Medications:     aspirin   81 mg Oral Daily   Chlorhexidine  Gluconate Cloth  6 each Topical Q0600   DULoxetine   30 mg Oral Daily   famotidine   10 mg Oral QHS   fluticasone  furoate-vilanterol  1 puff Inhalation Daily   heparin  injection (subcutaneous)  5,000 Units Subcutaneous Q8H   melatonin  2.5 mg Oral QHS  metoprolol  tartrate  12.5 mg Oral BID   pantoprazole   40 mg Oral Daily   sodium chloride  flush  3 mL Intravenous Q12H   traZODone   75 mg Oral QHS    Assessment/ Plan:     68 year old female with history of hypertension, COPD, hyperlipidemia, anxiety/depression, GERD, end-stage renal disease on dialysis admitted with history of syncopal episode at the dialysis center.  She was found to have NSTEMI.  #1: ESRD: Patient was dialyzed on Friday which she tolerated well.  She is on a Monday Wednesday Friday schedule.  Next dialysis scheduled for Monday.  #2: Anemia: Continue to monitor closely.  #3: Second hyperparathyroidism:-PTH was 304, calcium  8.3 and phosphorus of 4.9.  #4: NSTEMI/hypertension/aortic stenosis: Continue with metoprolol .  Need to be careful fluid removal at dialysis.  Cardiology on the case.  #5: Diabetes: Continue sliding scale insulin .  Labs and medications reviewed. Will continue to follow along with you.   LOS: 3 Pinkey Edman, MD [facility] 11/15/20251:23 PM

## 2024-03-28 NOTE — Evaluation (Signed)
 Occupational Therapy Evaluation Patient Details Name: Katie [name] MRN: [id number] DOB: June 28, 1955 Today's Date: 03/28/2024   History of Present Illness   27 68-year-old female with history of depression, anxiety, GERD, ESRD on hemodialysis, history of COPD, hypertension, who presents to the ED for chief concerns of syncope.     Clinical Impressions Upon entering the room, pt supine in bed and agreeable to OT evaluation. Pt reports living at [facility]. She states she can dress herself from bed level. Toileting is performed from bed level with bed pain secondary to fall risk. Staff assist her with all transfers into manual wheelchair. She can propel herself and they also propel her. Pt performs bed mobility with CGA. She stands with CGA and step pivots to [facility] for toileting needs CGA- min A for balance while performing clothing management. Pt able to perform hygiene while seated and then transferred to recliner chair with min HHA. Call bell and all needed items within reach. Pt is currently at functional baseline. OT to complete orders at this time.       Functional Status Assessment   Patient has not had a recent decline in their functional status     Equipment Recommendations   None recommended by OT      Precautions/Restrictions   Precautions Precautions: Fall     Mobility Bed Mobility Overal bed mobility: Needs Assistance Bed Mobility: Supine to Sit     Supine to sit: Contact guard          Transfers Overall transfer level: Needs assistance Equipment used: 1 person hand held assist Transfers: Sit to/from Stand, Bed to chair/wheelchair/BSC Sit to Stand: Contact guard assist, Min assist     Step pivot transfers: Contact guard assist            Balance Overall balance assessment: Needs assistance Sitting-balance support: Feet supported Sitting balance-Leahy Scale: Good     Standing balance support: Single extremity supported Standing  balance-Leahy Scale: Fair                             ADL either performed or assessed with clinical judgement   ADL Overall ADL's : At baseline                                       General ADL Comments: Pt transfers to 3 in 1 with CGA and performs hygiene and clothing management with CGA for balance.     Vision Patient Visual Report: No change from baseline              Pertinent Vitals/Pain Pain Assessment Pain Assessment: Faces Faces Pain Scale: Hurts a little bit Pain Location: all over and my butt Pain Descriptors / Indicators: Discomfort Pain Intervention(s): Monitored during session, Repositioned     Extremity/Trunk Assessment Upper Extremity Assessment Upper Extremity Assessment: Overall WFL for tasks assessed;Generalized weakness   Lower Extremity Assessment Lower Extremity Assessment: Overall WFL for tasks assessed;Generalized weakness       Communication Communication Communication: No apparent difficulties   Cognition Arousal: Alert Behavior During Therapy: WFL for tasks assessed/performed Cognition: No apparent impairments                               Following commands: Intact       Cueing  General Comments  Cueing Techniques: Verbal cues              Home Living Family/patient expects to be discharged to:: Assisted living                             Home Equipment: Wheelchair - manual   Additional Comments: Lives at [facility]      Prior Functioning/Environment Prior Level of Function : Needs assist             Mobility Comments: staff assist with transfer to wheelchair ADLs Comments: Pt reports performing dressing herself from bed level and staff assisting with all other ADLs inclusing bed pan only from bed secondary to fall risk     AM-PAC OT 6 Clicks Daily Activity     Outcome Measure Help from another person eating meals?: None Help from another  person taking care of personal grooming?: None Help from another person toileting, which includes using toliet, bedpan, or urinal?: A Little Help from another person bathing (including washing, rinsing, drying)?: A Little Help from another person to put on and taking off regular upper body clothing?: A Little Help from another person to put on and taking off regular lower body clothing?: A Little 6 Click Score: 20   End of Session Nurse Communication: Mobility status  Activity Tolerance: Patient tolerated treatment well Patient left: in chair;with call bell/phone within reach;with chair alarm set                   Time: 1010-1028 OT Time Calculation (min): 18 min Charges:  OT General Charges $OT Visit: 1 Visit OT Evaluation $OT Eval Low Complexity: 1 Low  Izetta Claude, MS, OTR/L , CBIS ascom [phone]  03/28/24, 10:39 AM

## 2024-03-28 NOTE — Plan of Care (Signed)
  Problem: Cardiac: Goal: Will achieve and/or maintain adequate cardiac output Outcome: Progressing   Problem: Clinical Measurements: Goal: Ability to maintain clinical measurements within normal limits will improve Outcome: Progressing Goal: Will remain free from infection Outcome: Progressing Goal: Diagnostic test results will improve Outcome: Progressing Goal: Respiratory complications will improve Outcome: Progressing Goal: Cardiovascular complication will be avoided Outcome: Progressing

## 2024-03-29 DIAGNOSIS — I350 Nonrheumatic aortic (valve) stenosis: Secondary | ICD-10-CM | POA: Diagnosis not present

## 2024-03-29 DIAGNOSIS — R7989 Other specified abnormal findings of blood chemistry: Secondary | ICD-10-CM | POA: Diagnosis not present

## 2024-03-29 DIAGNOSIS — I050 Rheumatic mitral stenosis: Secondary | ICD-10-CM | POA: Diagnosis not present

## 2024-03-29 LAB — CBC
HCT: 30.9 % — ABNORMAL LOW (ref 36.0–46.0)
Hemoglobin: 10.1 g/dL — ABNORMAL LOW (ref 12.0–15.0)
MCH: 33.4 pg (ref 26.0–34.0)
MCHC: 32.7 g/dL (ref 30.0–36.0)
MCV: 102.3 fL — ABNORMAL HIGH (ref 80.0–100.0)
Platelets: 114 K/uL — ABNORMAL LOW (ref 150–400)
RBC: 3.02 MIL/uL — ABNORMAL LOW (ref 3.87–5.11)
RDW: 13.2 % (ref 11.5–15.5)
WBC: 5.4 K/uL (ref 4.0–10.5)
nRBC: 0 % (ref 0.0–0.2)

## 2024-03-29 NOTE — Progress Notes (Signed)
 [facility]  PROGRESS NOTE   Subjective:   Seen at bedside.  Objective:  Vital signs: Blood pressure (!) 155/59, pulse 69, temperature 98.1 F (36.7 C), resp. rate 18, height 5' (1.524 m), weight 57.5 kg, SpO2 93%.  Intake/Output Summary (Last 24 hours) at 03/29/2024 1118 Last data filed at 03/29/2024 1005 Gross per 24 hour  Intake 540 ml  Output 50 ml  Net 490 ml   Filed Weights   03/27/24 1122 03/28/24 0500 03/29/24 0500  Weight: 52.6 kg 57 kg 57.5 kg     Physical Exam: General:  No acute distress  Head:  Normocephalic, atraumatic. Moist oral mucosal membranes  Eyes:  Anicteric  Neck:  Supple  Lungs:   Clear to auscultation, normal effort  Heart:  S1S2 no rubs  Abdomen:   Soft, nontender, bowel sounds present  Extremities:  peripheral edema.  Neurologic:  Awake, alert, following commands  Skin:  No lesions  Access:     Basic Metabolic Panel: Recent Labs  Lab 03/25/24 1504 03/26/24 0204 03/27/24 0716  NA 141 138 135  K 3.9 3.8 3.9  CL 100 100 97*  CO2 30 31 27   GLUCOSE 76 85 111*  BUN 30* 39* 59*  CREATININE 2.13* 2.56* 3.79*  CALCIUM  9.1 8.4* 9.0  PHOS  --   --  3.8   GFR: Estimated Creatinine Clearance: 11.8 mL/min (A) (by C-G formula based on SCr of 3.79 mg/dL (H)).  Liver Function Tests: Recent Labs  Lab 03/25/24 1504 03/27/24 0716  AST 50*  --   ALT 60*  --   ALKPHOS 121  --   BILITOT 0.6  --   PROT 6.6  --   ALBUMIN  3.3* 2.9*   No results for input(s): LIPASE, AMYLASE in the last 168 hours. No results for input(s): AMMONIA in the last 168 hours.  CBC: Recent Labs  Lab 03/25/24 1504 03/26/24 0204 03/27/24 0540 03/28/24 0541 03/29/24 0506  WBC 9.0 7.1 6.1 6.0 5.4  HGB 12.4 10.1* 9.8* 10.4* 10.1*  HCT 39.7 32.0* 30.0* 31.9* 30.9*  MCV 106.7* 104.6* 101.0* 102.2* 102.3*  PLT 103* 104* 105* 113* 114*     HbA1C: Hgb A1c MFr Bld  Date/Time Value Ref Range Status  01/14/2024 04:52 AM 4.5 (L) 4.8 - 5.6 %  Final    Comment:    (NOTE)         Prediabetes: 5.7 - 6.4         Diabetes: >6.4         Glycemic control for adults with diabetes: <7.0   09/23/2021 05:43 PM 4.7 (L) 4.8 - 5.6 % Final    Comment:    (NOTE) Pre diabetes:          5.7%-6.4%  Diabetes:              >6.4%  Glycemic control for   <7.0% adults with diabetes     Urinalysis: No results for input(s): COLORURINE, LABSPEC, PHURINE, GLUCOSEU, HGBUR, BILIRUBINUR, KETONESUR, PROTEINUR, UROBILINOGEN, NITRITE, LEUKOCYTESUR in the last 72 hours.  Invalid input(s): APPERANCEUR    Imaging: No results found.   Medications:     aspirin   81 mg Oral Daily   Chlorhexidine  Gluconate Cloth  6 each Topical Q0600   DULoxetine   30 mg Oral Daily   famotidine   10 mg Oral QHS   fluticasone  furoate-vilanterol  1 puff Inhalation Daily   heparin  injection (subcutaneous)  5,000 Units Subcutaneous Q8H   melatonin  2.5 mg Oral  QHS   metoprolol  tartrate  12.5 mg Oral BID   pantoprazole   40 mg Oral Daily   sodium chloride  flush  3 mL Intravenous Q12H   traZODone   75 mg Oral QHS    Assessment/ Plan:     68 year old female with history of hypertension, COPD, hyperlipidemia, anxiety/depression, GERD, end-stage renal disease on dialysis admitted with history of syncopal episode at the dialysis center.  She was found to have NSTEMI.   #1: ESRD: Patient was dialyzed on Friday which she tolerated well.  She is on a Monday Wednesday Friday schedule.  Next dialysis scheduled for Monday.   #2: Anemia: Continue to monitor closely.   #3: Second hyperparathyroidism:-PTH was 304, calcium  8.3 and phosphorus of 4.9.   #4: NSTEMI/hypertension/aortic stenosis: Continue with metoprolol .  Need to be careful fluid removal at dialysis.  Cardiology on the case.   #5: Diabetes: Continue sliding scale insulin .  Labs and medications reviewed. Will continue to follow along with you.   LOS: 4 Pinkey Edman, MD [facility] 11/16/202511:18 AM

## 2024-03-29 NOTE — Progress Notes (Signed)
 PROGRESS NOTE    Katie [name]  FMW:[id number] DOB: [birth year] DOA: 03/25/2024 PCP: Cordella Corning, FNP  Chief Complaint  Patient presents with   Loss of Consciousness    Hospital Course:  Katie [name] is a 68 year old female with depression, anxiety, GERD, ESRD on HD, COPD, hypertension, who presents to the ED with syncope at dialysis.  Patient was at dialysis when she got up to use the restroom and had syncopal event.  On arrival to ED she is mildly tachycardic to 104, vitals otherwise stable.  High-sensitivity troponin 289, AST 50, ALT 60, platelets 103.  She was given aspirin  324, started on heparin  bolus and drip.  She was admitted for further workup.  Only etiology of syncope appears to be severe aortic stenosis.  She was determined not to be a TAVR candidate and was recommended for hospice.  She is not interested in this recommendation and is pursuing a second opinion.  Medications were titrated and patient was determined to be ready for discharge.  Discharge then delayed due to difficulty securing return to assisted living facility  Subjective: Acute events overnight.  Patient has no complaints this morning.  She remains anxious to discharge home.  Unfortunately [facility] now reporting that they cannot take the patient until tomorrow.  Objective: Vitals:   03/28/24 2338 03/29/24 0434 03/29/24 0500 03/29/24 0821  BP: (!) 90/47 (!) 133/57  (!) 155/59  Pulse: 69 65  69  Resp: 19 19  18   Temp: 98.3 F (36.8 C) 97.8 F (36.6 C)  98.1 F (36.7 C)  TempSrc:      SpO2: 96% 100%  93%  Weight:   57.5 kg   Height:        Intake/Output Summary (Last 24 hours) at 03/29/2024 1446 Last data filed at 03/29/2024 1005 Gross per 24 hour  Intake 540 ml  Output 50 ml  Net 490 ml   Filed Weights   03/27/24 1122 03/28/24 0500 03/29/24 0500  Weight: 52.6 kg 57 kg 57.5 kg    Examination: General exam: Appears calm and comfortable, NAD  Respiratory system: No work of breathing,  symmetric chest wall expansion Cardiovascular system: Significant systolic murmur Gastrointestinal system: Abdomen is nondistended, soft and nontender.  Neuro: Alert and oriented. No focal neurological deficits. Extremities: Symmetric, expected ROM Skin: No rashes, lesions Psychiatry: Demonstrates appropriate judgement and insight. Mood & affect appropriate for situation.   Assessment & Plan:  Principal Problem:   NSTEMI (non-ST elevated myocardial infarction) (HCC) Active Problems:   Hypertension   Type 2 diabetes mellitus (HCC)   ESRD (end stage renal disease) on dialysis (HCC)   Severe aortic valve stenosis   Adjustment disorder with mixed anxiety and depressed mood   Chronic pain syndrome   Elevated troponin   Moderate mitral valve stenosis    Troponin elevation - Initially thought to be NSTEMI, cardiology now believes troponin elevation is likely secondary to demand ischemia - Has had recent coronary evaluation which was found to be predominantly in small caliber vessels not amenable to PCI. - Was briefly on heparin  drip - Echo EF 60 to 65%, moderate LVH, no RWMA, moderate mitral stenosis, aortic valve has severe calcification with severe stenosis. - Cardiology consulted  Syncopal event - May have been vasovagal given fluid shift during dialysis, likely complicated by severe aortic stenosis - CTA without pulmonary emboli - Ultimately does need TAVR - Does have orthostatic changes but does not meet criteria for orthostatic hypotension  Severe aortic stenosis - Was planning  for TAVR outpatient but due to recent fall and poor functional status this has been canceled.  This was reevaluated today with the structural heart team via cardiology and they feel she is not a TAVR candidate and recommended palliative approach.  Patient has requested for second opinion - Cardiology has arranged for [facility] referral - Metoprolol  added 11/14 to see if this improves her diastolic filling and  patient monitored overnight.  No acute issues on telemetry - In the interim have advised patient that she should mobilize with wheelchair only. - PT/OT ordered.  She will require home health again at DC.  History of subdural hematoma and intraparenchymal hematoma - After mechanical fall in September - Has follow-up outpatient with neurosurgery - CT head on arrival without acute intracranial abnormality  ESRD - Nephrology consulted, HD on routine schedule now  Type 2 diabetes - Sliding scale insulin  titrate as needed  Hypertension - Resume home meds  Chronic pain syndrome - Home dose oxycodone  resumed  Adjustment disorder with mixed anxiety and depressed mood - Resume home meds  Ascending thoracic aortic aneurysm - Seen on CTA.  4.1 cm.  Stable. - Needs annual outpatient imaging follow-up.  DVT prophylaxis: Heparin    Code Status: Limited: Do not attempt resuscitation (DNR) -DNR-LIMITED -Do Not Intubate/DNI  Disposition: Medically ready for discharge since 11/15 pending return to [facility]  Consultants:    Procedures:    Antimicrobials:  Anti-infectives (From admission, onward)    None       Data Reviewed: I have personally reviewed following labs and imaging studies CBC: Recent Labs  Lab 03/25/24 1504 03/26/24 0204 03/27/24 0540 03/28/24 0541 03/29/24 0506  WBC 9.0 7.1 6.1 6.0 5.4  HGB 12.4 10.1* 9.8* 10.4* 10.1*  HCT 39.7 32.0* 30.0* 31.9* 30.9*  MCV 106.7* 104.6* 101.0* 102.2* 102.3*  PLT 103* 104* 105* 113* 114*   Basic Metabolic Panel: Recent Labs  Lab 03/25/24 1504 03/26/24 0204 03/27/24 0716  NA 141 138 135  K 3.9 3.8 3.9  CL 100 100 97*  CO2 30 31 27   GLUCOSE 76 85 111*  BUN 30* 39* 59*  CREATININE 2.13* 2.56* 3.79*  CALCIUM  9.1 8.4* 9.0  PHOS  --   --  3.8   GFR: Estimated Creatinine Clearance: 11.8 mL/min (A) (by C-G formula based on SCr of 3.79 mg/dL (H)). Liver Function Tests: Recent Labs  Lab 03/25/24 1504  03/27/24 0716  AST 50*  --   ALT 60*  --   ALKPHOS 121  --   BILITOT 0.6  --   PROT 6.6  --   ALBUMIN  3.3* 2.9*   CBG: Recent Labs  Lab 03/27/24 0507 03/28/24 0617  GLUCAP 94 103*    Recent Results (from the past 240 hours)  Resp panel by RT-PCR (RSV, Flu A&B, Covid) Anterior Nasal Swab     Status: None   Collection Time: 03/25/24  6:53 PM   Specimen: Anterior Nasal Swab  Result Value Ref Range Status   SARS Coronavirus 2 by RT PCR NEGATIVE NEGATIVE Final    Comment: (NOTE) SARS-CoV-2 target nucleic acids are NOT DETECTED.  The SARS-CoV-2 RNA is generally detectable in upper respiratory specimens during the acute phase of infection. The lowest concentration of SARS-CoV-2 viral copies this assay can detect is 138 copies/mL. A negative result does not preclude SARS-Cov-2 infection and should not be used as the sole basis for treatment or other patient management decisions. A negative result may occur with  improper specimen collection/handling, submission  of specimen other than nasopharyngeal swab, presence of viral mutation(s) within the areas targeted by this assay, and inadequate number of viral copies(<138 copies/mL). A negative result must be combined with clinical observations, patient history, and epidemiological information. The expected result is Negative.  Fact Sheet for Patients:  bloggercourse.com  Fact Sheet for Healthcare Providers:  seriousbroker.it  This test is no t yet approved or cleared by the [place]  FDA and  has been authorized for detection and/or diagnosis of SARS-CoV-2 by FDA under an Emergency Use Authorization (EUA). This EUA will remain  in effect (meaning this test can be used) for the duration of the COVID-19 declaration under Section 564(b)(1) of the Act, 21 U.S.C.section 360bbb-3(b)(1), unless the authorization is terminated  or revoked sooner.       Influenza A by PCR NEGATIVE  NEGATIVE Final   Influenza B by PCR NEGATIVE NEGATIVE Final    Comment: (NOTE) The Xpert Xpress SARS-CoV-2/FLU/RSV plus assay is intended as an aid in the diagnosis of influenza from Nasopharyngeal swab specimens and should not be used as a sole basis for treatment. Nasal washings and aspirates are unacceptable for Xpert Xpress SARS-CoV-2/FLU/RSV testing.  Fact Sheet for Patients: bloggercourse.com  Fact Sheet for Healthcare Providers: seriousbroker.it  This test is not yet approved or cleared by the [place]  FDA and has been authorized for detection and/or diagnosis of SARS-CoV-2 by FDA under an Emergency Use Authorization (EUA). This EUA will remain in effect (meaning this test can be used) for the duration of the COVID-19 declaration under Section 564(b)(1) of the Act, 21 U.S.C. section 360bbb-3(b)(1), unless the authorization is terminated or revoked.     Resp Syncytial Virus by PCR NEGATIVE NEGATIVE Final    Comment: (NOTE) Fact Sheet for Patients: bloggercourse.com  Fact Sheet for Healthcare Providers: seriousbroker.it  This test is not yet approved or cleared by the [place]  FDA and has been authorized for detection and/or diagnosis of SARS-CoV-2 by FDA under an Emergency Use Authorization (EUA). This EUA will remain in effect (meaning this test can be used) for the duration of the COVID-19 declaration under Section 564(b)(1) of the Act, 21 U.S.C. section 360bbb-3(b)(1), unless the authorization is terminated or revoked.  Performed at [facility], [address], [place], [place] [address]      Radiology Studies: No results found.   Scheduled Meds:  aspirin   81 mg Oral Daily   Chlorhexidine  Gluconate Cloth  6 each Topical Q0600   DULoxetine   30 mg Oral Daily   famotidine   10 mg Oral QHS   fluticasone  furoate-vilanterol  1 puff Inhalation  Daily   heparin  injection (subcutaneous)  5,000 Units Subcutaneous Q8H   melatonin  2.5 mg Oral QHS   metoprolol  tartrate  12.5 mg Oral BID   pantoprazole   40 mg Oral Daily   sodium chloride  flush  3 mL Intravenous Q12H   traZODone   75 mg Oral QHS   Continuous Infusions:     LOS: 4 days  MDM: Patient is high risk for one or more organ failure.  They necessitate ongoing hospitalization for continued IV therapies and subsequent lab monitoring. Total time spent interpreting labs and vitals, reviewing the medical record, coordinating care amongst consultants and care team members, directly assessing and discussing care with the patient and/or family: 55 min   Desmen Schoffstall, DO [facility]  To contact the attending physician between 7A-7P please use Epic Chat. To contact the covering physician during after hours 7P-7A, please review Amion.  03/29/2024, 2:46 PM   *This document has been created with the assistance of dictation software. Please excuse typographical errors. *

## 2024-03-29 NOTE — Plan of Care (Signed)

## 2024-03-29 NOTE — TOC Progression Note (Addendum)
 Transition of Care [facility]) - Progression Note    Patient Details  Name: Katie [name] MRN: [id number] Date of Birth: 04/23/1956  [facility] CM/SW Contact  Victory Jackquline RAMAN, RN Phone Number: 03/29/2024, 9:19 AM  Clinical Narrative:  RNCM received a message via secure chat from the MD asking if the patient could return to [facility]. Called [facility] at [phone] spoke to [place] and she said that the patient would have to return on Monday because she has to be reassessed.MD made aware. RNCM will continue to follow for discharge planning needs.     Expected Discharge Plan: Assisted Living Barriers to Discharge: Continued Medical Work up               Expected Discharge Plan and Services     Post Acute Care Choice: Resumption of Svcs/PTA Provider Living arrangements for the past 2 months: Assisted Living Facility                                       Social Drivers of Health (SDOH) Interventions SDOH Screenings   Food Insecurity: No Food Insecurity (03/26/2024)  Housing: Low Risk  (03/26/2024)  Transportation Needs: No Transportation Needs (03/26/2024)  Utilities: Not At Risk (03/26/2024)  Social Connections: Socially Isolated (02/01/2024)  Tobacco Use: Medium Risk (03/25/2024)    Readmission Risk Interventions    03/27/2024    2:53 PM 01/16/2024   10:27 AM 06/13/2022    4:02 PM  Readmission Risk Prevention Plan  Transportation Screening Complete  Complete  Medication Review (RN Care Manager) Complete  Complete  PCP or Specialist appointment within 3-5 days of discharge Complete Complete   HRI or Home Care Consult Complete  Complete  SW Recovery Care/Counseling Consult Complete Complete   Palliative Care Screening Not Applicable Not Applicable Not Applicable  Skilled Nursing Facility Not Applicable Not Applicable Not Applicable

## 2024-03-29 NOTE — Progress Notes (Signed)
 Patient upset that she ordered 2 sandwiches and only received one. Pt educated there has to be an order for 2 sandwiches and she is also on a Carb Mod/Renal diet which restricts what she can have. Pt stated to this RN Well how am I supposed to gain weight then if they wont let me have more food dammit. Pt educated that cussing at the staff is not going to help the situation and asked if she would refrain. Pt responded well I didn't realize that it was such a bad cuss word. Pt educated that verbal abuse as well as physical isn't tolerated here. Educated that staff would check with the doctor. Pt then complained that she couldn't see and couldn't change the channel. Educated patient that asking for help was preferred over complaining. Assisted patient to find the show she was looking for. Patient thanks this RN. Notified MD of her concerns.

## 2024-03-30 DIAGNOSIS — I214 Non-ST elevation (NSTEMI) myocardial infarction: Secondary | ICD-10-CM | POA: Diagnosis not present

## 2024-03-30 LAB — RENAL FUNCTION PANEL
Albumin: 2.7 g/dL — ABNORMAL LOW (ref 3.5–5.0)
Anion gap: 11 (ref 5–15)
BUN: 75 mg/dL — ABNORMAL HIGH (ref 8–23)
CO2: 25 mmol/L (ref 22–32)
Calcium: 8.8 mg/dL — ABNORMAL LOW (ref 8.9–10.3)
Chloride: 100 mmol/L (ref 98–111)
Creatinine, Ser: 4.88 mg/dL — ABNORMAL HIGH (ref 0.44–1.00)
GFR, Estimated: 9 mL/min — ABNORMAL LOW (ref >60)
Glucose, Bld: 91 mg/dL (ref 70–99)
Phosphorus: 4.5 mg/dL (ref 2.5–4.6)
Potassium: 5 mmol/L (ref 3.5–5.1)
Sodium: 135 mmol/L (ref 135–145)

## 2024-03-30 LAB — CBC
HCT: 31.5 % — ABNORMAL LOW (ref 36.0–46.0)
Hemoglobin: 9.9 g/dL — ABNORMAL LOW (ref 12.0–15.0)
MCH: 32.9 pg (ref 26.0–34.0)
MCHC: 31.4 g/dL (ref 30.0–36.0)
MCV: 104.7 fL — ABNORMAL HIGH (ref 80.0–100.0)
Platelets: 117 K/uL — ABNORMAL LOW (ref 150–400)
RBC: 3.01 MIL/uL — ABNORMAL LOW (ref 3.87–5.11)
RDW: 13 % (ref 11.5–15.5)
WBC: 5.3 K/uL (ref 4.0–10.5)
nRBC: 0 % (ref 0.0–0.2)

## 2024-03-30 LAB — GLUCOSE, CAPILLARY: Glucose-Capillary: 93 mg/dL (ref 70–99)

## 2024-03-30 MED ORDER — MIDODRINE HCL 5 MG PO TABS
ORAL_TABLET | ORAL | Status: AC
Start: 2024-03-30 — End: 2024-03-30
  Filled 2024-03-30: qty 1

## 2024-03-30 MED ORDER — ONDANSETRON HCL 4 MG/2ML IJ SOLN
INTRAMUSCULAR | Status: AC
  Filled 2024-03-30: qty 2

## 2024-03-30 NOTE — Progress Notes (Addendum)
 [facility]  ROUNDING NOTE   Subjective:   Katie [name]  is a 68 y.o.  female with past medical history of ESRD with hemodialysis, diabetes mellitus, COPD, and hypertension. Patient presents to ED after suffering a fall at the dialysis center. She is admitted for Elevated troponin [R79.89] NSTEMI (non-ST elevated myocardial infarction) (HCC) [I21.4] Syncope, unspecified syncope type [R55]  Patient is known to our practice and receives outpatient dialysis treatments at [facility] on a MWF schedule.  Patient seen and evaluated during dialysis   HEMODIALYSIS FLOWSHEET:  Blood Flow Rate (mL/min): 399 mL/min Arterial Pressure (mmHg): -169.89 mmHg Venous Pressure (mmHg): 198.78 mmHg TMP (mmHg): 6.06 mmHg Ultrafiltration Rate (mL/min): 737 mL/min Dialysate Flow Rate (mL/min): 300 ml/min Dialysis Fluid Bolus: Normal Saline Bolus Amount (mL): 100 mL  Tolerating treatment well  Objective:  Vital signs in last 24 hours:  Temp:  [98 F (36.7 C)-98.1 F (36.7 C)] 98 F (36.7 C) (11/17 0732) Pulse Rate:  [58-73] 58 (11/17 1030) Resp:  [12-18] 13 (11/17 1030) BP: (80-192)/(45-60) 138/49 (11/17 1030) SpO2:  [94 %-100 %] 100 % (11/17 1030) Weight:  [53.3 kg-53.6 kg] 53.6 kg (11/17 0732)  Weight change: -4.2 kg Filed Weights   03/29/24 0500 03/30/24 0500 03/30/24 0732  Weight: 57.5 kg 53.3 kg 53.6 kg    Intake/Output: I/O last 3 completed shifts: In: 900 [P.O.:900] Out: 50 [Urine:50]   Intake/Output this shift:  No intake/output data recorded.  Physical Exam: General: NAD  Head: Normocephalic, atraumatic. Moist oral mucosal membranes  Eyes: Anicteric  Lungs:  Clear to auscultation, normal effort  Heart: Regular rate and rhythm  Abdomen:  Soft, nontender  Extremities:  No peripheral edema.  Neurologic: Awake, alert, conversant  Skin: Warm,dry, Right temporal bruising, generalized bruising on extremities  Access: Lt upper AVF    Basic Metabolic  Panel: Recent Labs  Lab 03/25/24 1504 03/26/24 0204 03/27/24 0716 03/30/24 0653  NA 141 138 135 135  K 3.9 3.8 3.9 5.0  CL 100 100 97* 100  CO2 30 31 27 25   GLUCOSE 76 85 111* 91  BUN 30* 39* 59* 75*  CREATININE 2.13* 2.56* 3.79* 4.88*  CALCIUM  9.1 8.4* 9.0 8.8*  PHOS  --   --  3.8 4.5    Liver Function Tests: Recent Labs  Lab 03/25/24 1504 03/27/24 0716 03/30/24 0653  AST 50*  --   --   ALT 60*  --   --   ALKPHOS 121  --   --   BILITOT 0.6  --   --   PROT 6.6  --   --   ALBUMIN  3.3* 2.9* 2.7*   No results for input(s): LIPASE, AMYLASE in the last 168 hours. No results for input(s): AMMONIA in the last 168 hours.  CBC: Recent Labs  Lab 03/26/24 0204 03/27/24 0540 03/28/24 0541 03/29/24 0506 03/30/24 0530  WBC 7.1 6.1 6.0 5.4 5.3  HGB 10.1* 9.8* 10.4* 10.1* 9.9*  HCT 32.0* 30.0* 31.9* 30.9* 31.5*  MCV 104.6* 101.0* 102.2* 102.3* 104.7*  PLT 104* 105* 113* 114* 117*    Cardiac Enzymes: No results for input(s): CKTOTAL, CKMB, CKMBINDEX, TROPONINI in the last 168 hours.  BNP: Invalid input(s): POCBNP  CBG: Recent Labs  Lab 03/27/24 0507 03/28/24 0617 03/30/24 0516  GLUCAP 94 103* 93    Microbiology: Results for orders placed or performed during the hospital encounter of 03/25/24  Resp panel by RT-PCR (RSV, Flu A&B, Covid) Anterior Nasal Swab     Status:  None   Collection Time: 03/25/24  6:53 PM   Specimen: Anterior Nasal Swab  Result Value Ref Range Status   SARS Coronavirus 2 by RT PCR NEGATIVE NEGATIVE Final    Comment: (NOTE) SARS-CoV-2 target nucleic acids are NOT DETECTED.  The SARS-CoV-2 RNA is generally detectable in upper respiratory specimens during the acute phase of infection. The lowest concentration of SARS-CoV-2 viral copies this assay can detect is 138 copies/mL. A negative result does not preclude SARS-Cov-2 infection and should not be used as the sole basis for treatment or other patient management decisions.  A negative result may occur with  improper specimen collection/handling, submission of specimen other than nasopharyngeal swab, presence of viral mutation(s) within the areas targeted by this assay, and inadequate number of viral copies(<138 copies/mL). A negative result must be combined with clinical observations, patient history, and epidemiological information. The expected result is Negative.  Fact Sheet for Patients:  bloggercourse.com  Fact Sheet for Healthcare Providers:  seriousbroker.it  This test is no t yet approved or cleared by the [place]  FDA and  has been authorized for detection and/or diagnosis of SARS-CoV-2 by FDA under an Emergency Use Authorization (EUA). This EUA will remain  in effect (meaning this test can be used) for the duration of the COVID-19 declaration under Section 564(b)(1) of the Act, 21 U.S.C.section 360bbb-3(b)(1), unless the authorization is terminated  or revoked sooner.       Influenza A by PCR NEGATIVE NEGATIVE Final   Influenza B by PCR NEGATIVE NEGATIVE Final    Comment: (NOTE) The Xpert Xpress SARS-CoV-2/FLU/RSV plus assay is intended as an aid in the diagnosis of influenza from Nasopharyngeal swab specimens and should not be used as a sole basis for treatment. Nasal washings and aspirates are unacceptable for Xpert Xpress SARS-CoV-2/FLU/RSV testing.  Fact Sheet for Patients: bloggercourse.com  Fact Sheet for Healthcare Providers: seriousbroker.it  This test is not yet approved or cleared by the [place]  FDA and has been authorized for detection and/or diagnosis of SARS-CoV-2 by FDA under an Emergency Use Authorization (EUA). This EUA will remain in effect (meaning this test can be used) for the duration of the COVID-19 declaration under Section 564(b)(1) of the Act, 21 U.S.C. section 360bbb-3(b)(1), unless the authorization  is terminated or revoked.     Resp Syncytial Virus by PCR NEGATIVE NEGATIVE Final    Comment: (NOTE) Fact Sheet for Patients: bloggercourse.com  Fact Sheet for Healthcare Providers: seriousbroker.it  This test is not yet approved or cleared by the [place]  FDA and has been authorized for detection and/or diagnosis of SARS-CoV-2 by FDA under an Emergency Use Authorization (EUA). This EUA will remain in effect (meaning this test can be used) for the duration of the COVID-19 declaration under Section 564(b)(1) of the Act, 21 U.S.C. section 360bbb-3(b)(1), unless the authorization is terminated or revoked.  Performed at [facility], [address] [place], [place] [address]     Coagulation Studies: No results for input(s): LABPROT, INR in the last 72 hours.   Urinalysis: No results for input(s): COLORURINE, LABSPEC, PHURINE, GLUCOSEU, HGBUR, BILIRUBINUR, KETONESUR, PROTEINUR, UROBILINOGEN, NITRITE, LEUKOCYTESUR in the last 72 hours.  Invalid input(s): APPERANCEUR    Imaging: No results found.    Medications:     aspirin   81 mg Oral Daily   Chlorhexidine  Gluconate Cloth  6 each Topical Q0600   DULoxetine   30 mg Oral Daily   famotidine   10 mg Oral QHS   fluticasone  furoate-vilanterol  1 puff  Inhalation Daily   heparin  injection (subcutaneous)  5,000 Units Subcutaneous Q8H   melatonin  2.5 mg Oral QHS   metoprolol  tartrate  12.5 mg Oral BID   pantoprazole   40 mg Oral Daily   sodium chloride  flush  3 mL Intravenous Q12H   traZODone   75 mg Oral QHS   acetaminophen  **OR** acetaminophen , albuterol , famotidine , hydrALAZINE , loperamide , midodrine , ondansetron  **OR** ondansetron  (ZOFRAN ) IV, oxyCODONE , topiramate   Assessment/ Plan:  [name] is a 68 y.o.  female with past medical history of ESRD with hemodialysis, diabetes mellitus, COPD, and hypertension. Patient presents  to ED after suffering a fall at the dialysis center. She is admitted for Elevated troponin [R79.89] NSTEMI (non-ST elevated myocardial infarction) (HCC) [I21.4] Syncope, unspecified syncope type [R55]  [facility] [place]/MWF/Lt AVF   End stage renal disease on hemodialysis.  Receiving dialysis today, UF 1L as tolerated. Next treatment scheduled for Wednesday.  2. Anemia of chronic kidney disease Lab Results  Component Value Date   HGB 9.9 (L) 03/30/2024    Hgb 9.9, no need for ESA  3. Secondary Hyperparathyroidism: with outpatient labs: PTH 304, phosphorus 4.9, calcium  8.3 on 03/23/24.   Lab Results  Component Value Date   PTH 70 (H) 05/12/2020   CALCIUM  8.8 (L) 03/30/2024   CAION 1.13 (L) 08/15/2023   PHOS 4.5 03/30/2024    Calcium  and phos stable  4. 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 Most recent hemoglobin A1c is 4.5 on 01/14/24.   Primary team will continue to manage SSI     LOS: 5 Bexton Haak 11/17/202510:42 AM

## 2024-03-30 NOTE — Plan of Care (Signed)
  Problem: Health Behavior/Discharge Planning: Goal: Ability to manage health-related needs will improve Outcome: Progressing   Problem: Clinical Measurements: Goal: Will remain free from infection Outcome: Progressing Goal: Respiratory complications will improve Outcome: Progressing   Problem: Nutrition: Goal: Adequate nutrition will be maintained Outcome: Progressing   Problem: Safety: Goal: Ability to remain free from injury will improve Outcome: Progressing   Problem: Skin Integrity: Goal: Risk for impaired skin integrity will decrease Outcome: Progressing

## 2024-03-30 NOTE — Plan of Care (Signed)

## 2024-03-30 NOTE — Progress Notes (Signed)
 PROGRESS NOTE    Katie [name]  FMW:[id number] DOB: Apr 21, 1956 DOA: 03/25/2024 PCP: Cordella Corning, FNP  Chief Complaint  Patient presents with   Loss of Consciousness    Hospital Course:  Katie [name] is a 68 year old female with depression, anxiety, GERD, ESRD on HD, COPD, hypertension, who presents to the ED with syncope at dialysis.  Patient was at dialysis when she got up to use the restroom and had syncopal event.  On arrival to ED she is mildly tachycardic to 104, vitals otherwise stable.  High-sensitivity troponin 289, AST 50, ALT 60, platelets 103.  She was given aspirin  324, started on heparin  bolus and drip.  She was admitted for further workup.  Only etiology of syncope appears to be severe aortic stenosis.  She was determined not to be a TAVR candidate and was recommended for hospice.  She is not interested in this recommendation and is pursuing a second opinion.  Medications were titrated and patient was determined to be ready for discharge.   Discharge then delayed due to difficulty securing return to assisted living facility  Subjective: No acute events overnight.  Patient continues to endorse a desire to discharge home.  Objective: Vitals:   03/30/24 1126 03/30/24 1134 03/30/24 1214 03/30/24 1617  BP: (!) 147/51 (!) 164/49 (!) 134/58 (!) 114/51  Pulse: 60 64 63 64  Resp: 12 20 20 17   Temp:   98.2 F (36.8 C) 98.8 F (37.1 C)  TempSrc:   Oral   SpO2: 100% 100% 98% 97%  Weight: 53.4 kg 52.3 kg    Height:        Intake/Output Summary (Last 24 hours) at 03/30/2024 1658 Last data filed at 03/30/2024 1134 Gross per 24 hour  Intake 480 ml  Output 2000 ml  Net -1520 ml   Filed Weights   03/30/24 0732 03/30/24 1126 03/30/24 1134  Weight: 53.6 kg 53.4 kg 52.3 kg    Examination: General exam: Appears calm and comfortable, NAD  Respiratory system: No work of breathing, symmetric chest wall expansion Cardiovascular system: Significant systolic  murmur Gastrointestinal system: Abdomen is nondistended, soft and nontender.  Neuro: Alert and oriented. No focal neurological deficits. Extremities: Symmetric, expected ROM Skin: No rashes, lesions Psychiatry: Demonstrates appropriate judgement and insight. Mood & affect appropriate for situation.   Assessment & Plan:  Principal Problem:   NSTEMI (non-ST elevated myocardial infarction) (HCC) Active Problems:   Hypertension   Type 2 diabetes mellitus (HCC)   ESRD (end stage renal disease) on dialysis (HCC)   Severe aortic valve stenosis   Adjustment disorder with mixed anxiety and depressed mood   Chronic pain syndrome   Elevated troponin   Moderate mitral valve stenosis    Troponin elevation - Initially thought to be NSTEMI, cardiology now believes troponin elevation is likely secondary to demand ischemia - Has had recent coronary evaluation which was found to be predominantly in small caliber vessels not amenable to PCI. - Was briefly on heparin  drip - Echo EF 60 to 65%, moderate LVH, no RWMA, moderate mitral stenosis, aortic valve has severe calcification with severe stenosis. - Cardiology consulted  Syncopal event - May have been vasovagal given fluid shift during dialysis, likely complicated by severe aortic stenosis - CTA without pulmonary emboli - Ultimately does need TAVR - Does have orthostatic changes but does not meet criteria for orthostatic hypotension  Severe aortic stenosis - Was planning for TAVR outpatient but due to recent fall and poor functional status this has been canceled.  This was reevaluated today with the structural heart team via cardiology and they feel she is not a TAVR candidate and recommended palliative approach.  Patient has requested for second opinion - Cardiology has arranged for [facility] referral - Metoprolol  added 11/14 to see if this improves her diastolic filling and patient monitored overnight.  No acute issues on telemetry - In the interim  have advised patient that she should mobilize with wheelchair only. - PT/OT ordered.  She will require home health again at DC.  History of subdural hematoma and intraparenchymal hematoma - After mechanical fall in September - Has follow-up outpatient with neurosurgery - CT head on arrival without acute intracranial abnormality  ESRD - Nephrology consulted, HD on routine schedule now  Type 2 diabetes - Sliding scale insulin  titrate as needed  Hypertension - Resume home meds  Chronic pain syndrome - Home dose oxycodone  resumed  Adjustment disorder with mixed anxiety and depressed mood - Resume home meds  Ascending thoracic aortic aneurysm - Seen on CTA.  4.1 cm.  Stable. - Needs annual outpatient imaging follow-up.  DVT prophylaxis: Heparin    Code Status: Limited: Do not attempt resuscitation (DNR) -DNR-LIMITED -Do Not Intubate/DNI  Disposition: Medically ready for discharge since 11/15 pending return to [facility].  TOC aware.  Consultants:    Procedures:    Antimicrobials:  Anti-infectives (From admission, onward)    None       Data Reviewed: I have personally reviewed following labs and imaging studies CBC: Recent Labs  Lab 03/26/24 0204 03/27/24 0540 03/28/24 0541 03/29/24 0506 03/30/24 0530  WBC 7.1 6.1 6.0 5.4 5.3  HGB 10.1* 9.8* 10.4* 10.1* 9.9*  HCT 32.0* 30.0* 31.9* 30.9* 31.5*  MCV 104.6* 101.0* 102.2* 102.3* 104.7*  PLT 104* 105* 113* 114* 117*   Basic Metabolic Panel: Recent Labs  Lab 03/25/24 1504 03/26/24 0204 03/27/24 0716 03/30/24 0653  NA 141 138 135 135  K 3.9 3.8 3.9 5.0  CL 100 100 97* 100  CO2 30 31 27 25   GLUCOSE 76 85 111* 91  BUN 30* 39* 59* 75*  CREATININE 2.13* 2.56* 3.79* 4.88*  CALCIUM  9.1 8.4* 9.0 8.8*  PHOS  --   --  3.8 4.5   GFR: Estimated Creatinine Clearance: 8.3 mL/min (A) (by C-G formula based on SCr of 4.88 mg/dL (H)). Liver Function Tests: Recent Labs  Lab 03/25/24 1504 03/27/24 0716  03/30/24 0653  AST 50*  --   --   ALT 60*  --   --   ALKPHOS 121  --   --   BILITOT 0.6  --   --   PROT 6.6  --   --   ALBUMIN  3.3* 2.9* 2.7*   CBG: Recent Labs  Lab 03/27/24 0507 03/28/24 0617 03/30/24 0516  GLUCAP 94 103* 93    Recent Results (from the past 240 hours)  Resp panel by RT-PCR (RSV, Flu A&B, Covid) Anterior Nasal Swab     Status: None   Collection Time: 03/25/24  6:53 PM   Specimen: Anterior Nasal Swab  Result Value Ref Range Status   SARS Coronavirus 2 by RT PCR NEGATIVE NEGATIVE Final    Comment: (NOTE) SARS-CoV-2 target nucleic acids are NOT DETECTED.  The SARS-CoV-2 RNA is generally detectable in upper respiratory specimens during the acute phase of infection. The lowest concentration of SARS-CoV-2 viral copies this assay can detect is 138 copies/mL. A negative result does not preclude SARS-Cov-2 infection and should not be used as the sole basis  for treatment or other patient management decisions. A negative result may occur with  improper specimen collection/handling, submission of specimen other than nasopharyngeal swab, presence of viral mutation(s) within the areas targeted by this assay, and inadequate number of viral copies(<138 copies/mL). A negative result must be combined with clinical observations, patient history, and epidemiological information. The expected result is Negative.  Fact Sheet for Patients:  bloggercourse.com  Fact Sheet for Healthcare Providers:  seriousbroker.it  This test is no t yet approved or cleared by the [place]  FDA and  has been authorized for detection and/or diagnosis of SARS-CoV-2 by FDA under an Emergency Use Authorization (EUA). This EUA will remain  in effect (meaning this test can be used) for the duration of the COVID-19 declaration under Section 564(b)(1) of the Act, 21 U.S.C.section 360bbb-3(b)(1), unless the authorization is terminated  or  revoked sooner.       Influenza A by PCR NEGATIVE NEGATIVE Final   Influenza B by PCR NEGATIVE NEGATIVE Final    Comment: (NOTE) The Xpert Xpress SARS-CoV-2/FLU/RSV plus assay is intended as an aid in the diagnosis of influenza from Nasopharyngeal swab specimens and should not be used as a sole basis for treatment. Nasal washings and aspirates are unacceptable for Xpert Xpress SARS-CoV-2/FLU/RSV testing.  Fact Sheet for Patients: bloggercourse.com  Fact Sheet for Healthcare Providers: seriousbroker.it  This test is not yet approved or cleared by the [place]  FDA and has been authorized for detection and/or diagnosis of SARS-CoV-2 by FDA under an Emergency Use Authorization (EUA). This EUA will remain in effect (meaning this test can be used) for the duration of the COVID-19 declaration under Section 564(b)(1) of the Act, 21 U.S.C. section 360bbb-3(b)(1), unless the authorization is terminated or revoked.     Resp Syncytial Virus by PCR NEGATIVE NEGATIVE Final    Comment: (NOTE) Fact Sheet for Patients: bloggercourse.com  Fact Sheet for Healthcare Providers: seriousbroker.it  This test is not yet approved or cleared by the [place]  FDA and has been authorized for detection and/or diagnosis of SARS-CoV-2 by FDA under an Emergency Use Authorization (EUA). This EUA will remain in effect (meaning this test can be used) for the duration of the COVID-19 declaration under Section 564(b)(1) of the Act, 21 U.S.C. section 360bbb-3(b)(1), unless the authorization is terminated or revoked.  Performed at [facility], [address] [place], [place] [address]      Radiology Studies: No results found.   Scheduled Meds:  aspirin   81 mg Oral Daily   Chlorhexidine  Gluconate Cloth  6 each Topical Q0600   DULoxetine   30 mg Oral Daily   famotidine   10 mg Oral QHS    fluticasone  furoate-vilanterol  1 puff Inhalation Daily   heparin  injection (subcutaneous)  5,000 Units Subcutaneous Q8H   melatonin  2.5 mg Oral QHS   metoprolol  tartrate  12.5 mg Oral BID   pantoprazole   40 mg Oral Daily   sodium chloride  flush  3 mL Intravenous Q12H   traZODone   75 mg Oral QHS   Continuous Infusions:     LOS: 5 days  MDM: Patient is high risk for one or more organ failure.  They necessitate ongoing hospitalization for continued IV therapies and subsequent lab monitoring. Total time spent interpreting labs and vitals, reviewing the medical record, coordinating care amongst consultants and care team members, directly assessing and discussing care with the patient and/or family: 55 min   Emary Zalar, DO [facility]  To contact the attending physician between  7A-7P please use Epic Chat. To contact the covering physician during after hours 7P-7A, please review Amion.  03/30/2024, 4:58 PM   *This document has been created with the assistance of dictation software. Please excuse typographical errors. *

## 2024-03-30 NOTE — Progress Notes (Signed)
 Pt transported to HD via bed with transport tech.

## 2024-03-30 NOTE — Progress Notes (Signed)
   03/30/24 1134  Vitals  BP (!) 164/49  Pulse Rate 64  Resp 20  Weight 52.3 kg  Oxygen Therapy  SpO2 100 %  O2 Device Room Air  Patient Activity (if Appropriate) In bed  Pulse Oximetry Type Continuous  Oximetry Probe Site Changed No  Post Treatment  Dialyzer Clearance Clear  Hemodialysis Intake (mL) 0 mL  Liters Processed 84  Fluid Removed (mL) 1000 mL  Tolerated HD Treatment Yes  Post-Hemodialysis Comments Tx has been tolerated. Set goal of removed. Vs have remained stable. Hemostasis has been achieved. Patient is stable to return to assigned floor. Report has been provided  AVG/AVF Arterial Site Held (minutes) 5 minutes  AVG/AVF Venous Site Held (minutes) 5 minutes

## 2024-03-30 NOTE — Progress Notes (Signed)
   03/30/24 1134  Vitals  BP (!) 164/49  Pulse Rate 64  ECG Heart Rate 63  Resp 20  Weight 52.3 kg  Oxygen Therapy  SpO2 100 %  O2 Device Room Air  Patient Activity (if Appropriate) In bed  Pulse Oximetry Type Continuous  Oximetry Probe Site Changed No  During Treatment Monitoring  Blood Flow Rate (mL/min) 0 mL/min  Arterial Pressure (mmHg) -2.02 mmHg  Venous Pressure (mmHg) -1.21 mmHg  TMP (mmHg) 14.54 mmHg  Ultrafiltration Rate (mL/min) 720 mL/min  Dialysate Flow Rate (mL/min) 300 ml/min  Dialysate Potassium Concentration 2  Dialysate Calcium  Concentration 2.5  Duration of HD Treatment -hour(s) 3.5 hour(s)  Cumulative Fluid Removed (mL) per Treatment  1000.17  Post Treatment  Dialyzer Clearance Clear  Hemodialysis Intake (mL) 0 mL  Liters Processed 84  Fluid Removed (mL) 1000 mL  Tolerated HD Treatment Yes  Post-Hemodialysis Comments Tx has been tolerated. Set goal of removed. Vs have remained stable. Hemostasis has been achieved. Patient is stable to return to assigned floor. Report has been provided  AVG/AVF Arterial Site Held (minutes) 5 minutes  AVG/AVF Venous Site Held (minutes) 5 minutes

## 2024-03-31 DIAGNOSIS — I214 Non-ST elevation (NSTEMI) myocardial infarction: Secondary | ICD-10-CM | POA: Diagnosis not present

## 2024-03-31 MED ORDER — METOPROLOL TARTRATE 25 MG PO TABS: 12.5000 mg | ORAL_TABLET | Freq: Two times a day (BID) | ORAL | 0 refills | Status: DC

## 2024-03-31 NOTE — TOC Transition Note (Signed)
 Transition of Care [facility]) - Discharge Note   Patient Details  Name: Nimisha Rathel MRN: [id number] Date of Birth: June 25, 1955  [facility] CM/SW Contact:  Alfonso Rummer, LCSW Phone Number: 03/31/2024, 4:39 PM   Clinical Narrative:      Ms. Ferrier discharged to [facility] via facility transportation    Barriers to Discharge: Continued Medical Work up   Patient Goals and CMS Choice            Discharge Placement                       Discharge Plan and Services Additional resources added to the After Visit Summary for       Post Acute Care Choice: Resumption of Svcs/PTA Provider                               Social Drivers of Health (SDOH) Interventions SDOH Screenings   Food Insecurity: No Food Insecurity (03/26/2024)  Housing: Low Risk  (03/26/2024)  Transportation Needs: No Transportation Needs (03/26/2024)  Utilities: Not At Risk (03/26/2024)  Social Connections: Socially Isolated (02/01/2024)  Tobacco Use: Medium Risk (03/25/2024)     Readmission Risk Interventions    03/27/2024    2:53 PM 01/16/2024   10:27 AM 06/13/2022    4:02 PM  Readmission Risk Prevention Plan  Transportation Screening Complete  Complete  Medication Review (RN Care Manager) Complete  Complete  PCP or Specialist appointment within 3-5 days of discharge Complete Complete   HRI or Home Care Consult Complete  Complete  SW Recovery Care/Counseling Consult Complete Complete   Palliative Care Screening Not Applicable Not Applicable Not Applicable  Skilled Nursing Facility Not Applicable Not Applicable Not Applicable

## 2024-03-31 NOTE — Plan of Care (Signed)

## 2024-03-31 NOTE — Progress Notes (Signed)
 [facility]  ROUNDING NOTE   Subjective:   Katie [name]  is a 68 y.o.  female with past medical history of ESRD with hemodialysis, diabetes mellitus, COPD, and hypertension. Patient presents to ED after suffering a fall at the dialysis center. She is admitted for Elevated troponin [R79.89] NSTEMI (non-ST elevated myocardial infarction) (HCC) [I21.4] Syncope, unspecified syncope type [R55]  Patient is known to our practice and receives outpatient dialysis treatments at [facility] on a MWF schedule.  Patient seen laying in bed Alert and oriented Generalized soreness   Objective:  Vital signs in last 24 hours:  Temp:  [98.2 F (36.8 C)-98.8 F (37.1 C)] 98.6 F (37 C) (11/18 0029) Pulse Rate:  [59-71] 64 (11/18 0921) Resp:  [15-20] 15 (11/18 0810) BP: (102-170)/(50-60) 170/57 (11/18 0921) SpO2:  [95 %-98 %] 95 % (11/18 0810) Weight:  [54.2 kg] 54.2 kg (11/18 0944)  Weight change: 0.3 kg Filed Weights   03/30/24 1126 03/30/24 1134 03/31/24 0944  Weight: 53.4 kg 52.3 kg 54.2 kg    Intake/Output: I/O last 3 completed shifts: In: 480 [P.O.:480] Out: 2000 [Other:2000]   Intake/Output this shift:  Total I/O In: 3 [I.V.:3] Out: -   Physical Exam: General: NAD  Head: Normocephalic, atraumatic. Moist oral mucosal membranes  Eyes: Anicteric  Lungs:  Clear to auscultation, normal effort  Heart: Regular rate and rhythm  Abdomen:  Soft, nontender  Extremities:  No peripheral edema.  Neurologic: Awake, alert, conversant  Skin: Warm,dry, Right temporal bruising, generalized bruising on extremities  Access: Lt upper AVF    Basic Metabolic Panel: Recent Labs  Lab 03/25/24 1504 03/26/24 0204 03/27/24 0716 03/30/24 0653  NA 141 138 135 135  K 3.9 3.8 3.9 5.0  CL 100 100 97* 100  CO2 30 31 27 25   GLUCOSE 76 85 111* 91  BUN 30* 39* 59* 75*  CREATININE 2.13* 2.56* 3.79* 4.88*  CALCIUM  9.1 8.4* 9.0 8.8*  PHOS  --   --  3.8 4.5    Liver Function  Tests: Recent Labs  Lab 03/25/24 1504 03/27/24 0716 03/30/24 0653  AST 50*  --   --   ALT 60*  --   --   ALKPHOS 121  --   --   BILITOT 0.6  --   --   PROT 6.6  --   --   ALBUMIN  3.3* 2.9* 2.7*   No results for input(s): LIPASE, AMYLASE in the last 168 hours. No results for input(s): AMMONIA in the last 168 hours.  CBC: Recent Labs  Lab 03/26/24 0204 03/27/24 0540 03/28/24 0541 03/29/24 0506 03/30/24 0530  WBC 7.1 6.1 6.0 5.4 5.3  HGB 10.1* 9.8* 10.4* 10.1* 9.9*  HCT 32.0* 30.0* 31.9* 30.9* 31.5*  MCV 104.6* 101.0* 102.2* 102.3* 104.7*  PLT 104* 105* 113* 114* 117*    Cardiac Enzymes: No results for input(s): CKTOTAL, CKMB, CKMBINDEX, TROPONINI in the last 168 hours.  BNP: Invalid input(s): POCBNP  CBG: Recent Labs  Lab 03/27/24 0507 03/28/24 0617 03/30/24 0516  GLUCAP 94 103* 93    Microbiology: Results for orders placed or performed during the hospital encounter of 03/25/24  Resp panel by RT-PCR (RSV, Flu A&B, Covid) Anterior Nasal Swab     Status: None   Collection Time: 03/25/24  6:53 PM   Specimen: Anterior Nasal Swab  Result Value Ref Range Status   SARS Coronavirus 2 by RT PCR NEGATIVE NEGATIVE Final    Comment: (NOTE) SARS-CoV-2 target nucleic acids are  NOT DETECTED.  The SARS-CoV-2 RNA is generally detectable in upper respiratory specimens during the acute phase of infection. The lowest concentration of SARS-CoV-2 viral copies this assay can detect is 138 copies/mL. A negative result does not preclude SARS-Cov-2 infection and should not be used as the sole basis for treatment or other patient management decisions. A negative result may occur with  improper specimen collection/handling, submission of specimen other than nasopharyngeal swab, presence of viral mutation(s) within the areas targeted by this assay, and inadequate number of viral copies(<138 copies/mL). A negative result must be combined with clinical observations,  patient history, and epidemiological information. The expected result is Negative.  Fact Sheet for Patients:  bloggercourse.com  Fact Sheet for Healthcare Providers:  seriousbroker.it  This test is no t yet approved or cleared by the [place]  FDA and  has been authorized for detection and/or diagnosis of SARS-CoV-2 by FDA under an Emergency Use Authorization (EUA). This EUA will remain  in effect (meaning this test can be used) for the duration of the COVID-19 declaration under Section 564(b)(1) of the Act, 21 U.S.C.section 360bbb-3(b)(1), unless the authorization is terminated  or revoked sooner.       Influenza A by PCR NEGATIVE NEGATIVE Final   Influenza B by PCR NEGATIVE NEGATIVE Final    Comment: (NOTE) The Xpert Xpress SARS-CoV-2/FLU/RSV plus assay is intended as an aid in the diagnosis of influenza from Nasopharyngeal swab specimens and should not be used as a sole basis for treatment. Nasal washings and aspirates are unacceptable for Xpert Xpress SARS-CoV-2/FLU/RSV testing.  Fact Sheet for Patients: bloggercourse.com  Fact Sheet for Healthcare Providers: seriousbroker.it  This test is not yet approved or cleared by the [place]  FDA and has been authorized for detection and/or diagnosis of SARS-CoV-2 by FDA under an Emergency Use Authorization (EUA). This EUA will remain in effect (meaning this test can be used) for the duration of the COVID-19 declaration under Section 564(b)(1) of the Act, 21 U.S.C. section 360bbb-3(b)(1), unless the authorization is terminated or revoked.     Resp Syncytial Virus by PCR NEGATIVE NEGATIVE Final    Comment: (NOTE) Fact Sheet for Patients: bloggercourse.com  Fact Sheet for Healthcare Providers: seriousbroker.it  This test is not yet approved or cleared by the [place]  [place] FDA and has been authorized for detection and/or diagnosis of SARS-CoV-2 by FDA under an Emergency Use Authorization (EUA). This EUA will remain in effect (meaning this test can be used) for the duration of the COVID-19 declaration under Section 564(b)(1) of the Act, 21 U.S.C. section 360bbb-3(b)(1), unless the authorization is terminated or revoked.  Performed at [facility], [address] [place], [place] [address]     Coagulation Studies: No results for input(s): LABPROT, INR in the last 72 hours.   Urinalysis: No results for input(s): COLORURINE, LABSPEC, PHURINE, GLUCOSEU, HGBUR, BILIRUBINUR, KETONESUR, PROTEINUR, UROBILINOGEN, NITRITE, LEUKOCYTESUR in the last 72 hours.  Invalid input(s): APPERANCEUR    Imaging: No results found.    Medications:     aspirin   81 mg Oral Daily   Chlorhexidine  Gluconate Cloth  6 each Topical Q0600   DULoxetine   30 mg Oral Daily   famotidine   10 mg Oral QHS   fluticasone  furoate-vilanterol  1 puff Inhalation Daily   heparin  injection (subcutaneous)  5,000 Units Subcutaneous Q8H   melatonin  2.5 mg Oral QHS   metoprolol  tartrate  12.5 mg Oral BID   pantoprazole   40 mg Oral Daily   sodium chloride  flush  3 mL Intravenous Q12H   traZODone   75 mg Oral QHS   albuterol , famotidine , loperamide , midodrine , oxyCODONE , topiramate   Assessment/ Plan:  [name] is a 68 y.o.  female with past medical history of ESRD with hemodialysis, diabetes mellitus, COPD, and hypertension. Patient presents to ED after suffering a fall at the dialysis center. She is admitted for Elevated troponin [R79.89] NSTEMI (non-ST elevated myocardial infarction) (HCC) [I21.4] Syncope, unspecified syncope type [R55]  [facility] [place]/MWF/Lt AVF   End stage renal disease on hemodialysis. Received dialysis Yesterday with UF 2L achieved. Next treatment scheduled for Wednesday.  2. Anemia of chronic kidney  disease Lab Results  Component Value Date   HGB 9.9 (L) 03/30/2024    Hgb 9.9, can consider low dose ESA.   3. Secondary Hyperparathyroidism: with outpatient labs: PTH 304, phosphorus 4.9, calcium  8.3 on 03/23/24.   Lab Results  Component Value Date   PTH 70 (H) 05/12/2020   CALCIUM  8.8 (L) 03/30/2024   CAION 1.13 (L) 08/15/2023   PHOS 4.5 03/30/2024    Calcium  and phos within optimal range  4. 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 Most recent hemoglobin A1c is 4.5 on 01/14/24.   Glucose well controlled. Primary team will continue to manage SSI     LOS: 6 Katie [name] 11/18/202512:05 PM

## 2024-03-31 NOTE — Progress Notes (Signed)
 Pt refused CBG , 0400 VS and CHG bath.

## 2024-03-31 NOTE — Discharge Summary (Signed)
 DISCHARGE SUMMARY    Katie [name] FMW:[id number] DOB: 18-Aug-1955 DOA: 03/25/2024  PCP: Cordella Corning, FNP  Admit date: 03/25/2024 Discharge date: 03/31/2024   Recommendations for Outpatient Follow-up:  Follow up with PCP in 1-2 weeks to review chronic condition management Follow-up with [facility] cardiology to discuss valve replacement    Hospital Course: Katie [name] is a 68 year old female with depression, anxiety, GERD, ESRD on HD, COPD, hypertension, who presents to the ED with syncope at dialysis.  Patient was at dialysis when she got up to use the restroom and had syncopal event.  On arrival to ED she is mildly tachycardic to 104, vitals otherwise stable.  High-sensitivity troponin 289, AST 50, ALT 60, platelets 103.  She was given aspirin  324, started on heparin  bolus and drip.  She was admitted for further workup.  Only etiology of syncope appears to be severe aortic stenosis.  She was determined not to be a TAVR candidate and was recommended for hospice.  She is not interested in this recommendation and is pursuing a second opinion.  Medications were titrated and patient was determined to be ready for discharge on 11/15.  Discharge then delayed due to difficulty securing return to assisted living facility. She is returning to [facility] today.     Troponin elevation - Initially thought to be NSTEMI, cardiology now believes troponin elevation is likely secondary to demand ischemia - Has had recent coronary evaluation which was found to be predominantly in small caliber vessels not amenable to PCI. - Was briefly on heparin  drip - Echo EF 60 to 65%, moderate LVH, no RWMA, moderate mitral stenosis, aortic valve has severe calcification with severe stenosis. - Cardiology consulted this admission   Syncopal event - May have been vasovagal given fluid shift during dialysis, likely complicated by severe aortic stenosis - CTA without pulmonary emboli - Ultimately does need TAVR -  Does have orthostatic changes but does not meet criteria for orthostatic hypotension.  Have advised to avoid standing and sudden position shifts.  Patient reports she will stay in her wheelchair from now on instead of continuing to try to ambulate   Severe aortic stenosis - Was planning for TAVR outpatient but due to recent fall and poor functional status this has been canceled.  This was reevaluated today with the structural heart team via cardiology and they feel she is not a TAVR candidate and recommended palliative approach.  Patient has requested for second opinion - Cardiology has arranged for [facility] referral - Metoprolol  added 11/14 to see if this improves her diastolic filling and patient monitored overnight.  No acute issues on telemetry - In the interim have advised patient that she should mobilize with wheelchair only. - PT/OT ordered.  Home health ordered at DC.  History of subdural hematoma and intraparenchymal hematoma - After mechanical fall in September - Has follow-up outpatient with neurosurgery - CT head on arrival without acute intracranial abnormality   ESRD - Continue HD on routine schedule now   Type 2 diabetes - Sliding scale insulin  titrate as needed   Hypertension - Resume home meds   Chronic pain syndrome - Home dose oxycodone  resumed   Adjustment disorder with mixed anxiety and depressed mood - Resume home meds   Ascending thoracic aortic aneurysm - Seen on CTA.  4.1 cm.  Stable. - Needs annual outpatient imaging follow-up.  Discharge Instructions  Discharge Instructions     Call MD for:  difficulty breathing, headache or visual disturbances   Complete by: As directed  Call MD for:  persistant dizziness or light-headedness   Complete by: As directed    Call MD for:  persistant nausea and vomiting   Complete by: As directed    Call MD for:  severe uncontrolled pain   Complete by: As directed    Call MD for:  temperature >100.4   Complete by: As  directed    Diet general   Complete by: As directed    Discharge instructions   Complete by: As directed    You have been referred to [facility] cardiology for second opinion regarding your heart valve.  Please follow-up with them closely   Increase activity slowly   Complete by: As directed    No wound care   Complete by: As directed       Allergies as of 03/31/2024       Reactions   Penicillins Nausea And Vomiting   Pt states only pills make her vomit Other reaction(s): Nausea/Vomit        Medication List     STOP taking these medications    neomycin-bacitracin-polymyxin [phone] Oint   tiZANidine  2 MG tablet Commonly known as: ZANAFLEX        TAKE these medications    acetaminophen  500 MG tablet Commonly known as: TYLENOL  Take 500 mg by mouth 3 (three) times daily. Take additional 500 mg every 6 hours as needed for pain   albuterol  108 (90 Base) MCG/ACT inhaler Commonly known as: VENTOLIN  HFA Inhale 1 puff into the lungs every 6 (six) hours as needed for wheezing.   aspirin  81 MG chewable tablet Chew 81 mg by mouth daily.   DULoxetine  30 MG capsule Commonly known as: CYMBALTA  Take 30 mg by mouth daily.   famotidine  20 MG tablet Commonly known as: PEPCID  Take 10 mg by mouth at bedtime.   fluticasone -salmeterol 250-50 MCG/ACT Aepb Commonly known as: ADVAIR Inhale 1 puff into the lungs 2 (two) times daily.   loperamide  2 MG tablet Commonly known as: IMODIUM  A-D Take 2 mg by mouth 4 (four) times daily as needed for diarrhea or loose stools.   melatonin 3 MG Tabs tablet Take 3 mg by mouth at bedtime.   metoprolol  tartrate 25 MG tablet Commonly known as: LOPRESSOR  Take 0.5 tablets (12.5 mg total) by mouth 2 (two) times daily.   midodrine  5 MG tablet Commonly known as: PROAMATINE  Take 5 mg by mouth. Monday, Wednesday, Friday prior to dialysis   MiraLax  17 GM/SCOOP powder Generic drug: polyethylene glycol powder Take 17 g by mouth daily  as needed for mild constipation.   ondansetron  4 MG tablet Commonly known as: ZOFRAN  Take 4 mg by mouth every Monday, Wednesday, and Friday.   oxyCODONE  5 MG immediate release tablet Commonly known as: Oxy IR/ROXICODONE  Take 5 mg by mouth 4 (four) times daily as needed.   pantoprazole  40 MG tablet Commonly known as: PROTONIX  Take 40 mg by mouth daily.   Sarna Sensitive 1 % Lotn Generic drug: pramoxine Apply 1 Application topically 2 (two) times daily.   topiramate  50 MG tablet Commonly known as: TOPAMAX  Take 1 tablet (50 mg total) by mouth 2 (two) times daily as needed (headache).   traZODone  50 MG tablet Commonly known as: DESYREL  Take 75 mg by mouth at bedtime.        Allergies  Allergen Reactions   Penicillins Nausea And Vomiting    Pt states only pills make her vomit Other reaction(s): Nausea/Vomit    Consultations:    Procedures/Studies: CT  Angio Chest Pulmonary Embolism (PE) W or WO Contrast Result Date: 03/26/2024 CLINICAL DATA:  Recurrent syncope after getting up from hemodialysis to void. Clinical concern for pulmonary embolism. No chest symptoms. EXAM: CT ANGIOGRAPHY CHEST WITH CONTRAST TECHNIQUE: Multidetector CT imaging of the chest was performed using the standard protocol during bolus administration of intravenous contrast. Multiplanar CT image reconstructions and MIPs were obtained to evaluate the vascular anatomy. RADIATION DOSE REDUCTION: This exam was performed according to the departmental dose-optimization program which includes automated exposure control, adjustment of the mA and/or kV according to patient size and/or use of iterative reconstruction technique. CONTRAST:  75mL OMNIPAQUE  IOHEXOL  350 MG/ML SOLN COMPARISON:  01/14/2024 FINDINGS: Cardiovascular: Normally opacified pulmonary arteries with no pulmonary arterial filling defects seen. The ascending thoracic aorta continues to measure 4.1 cm in maximum diameter. Stable enlarged heart. Stable  small pericardial effusion with a maximum thickness of 11 mm. Atheromatous calcifications, including the coronary arteries and aorta. Mediastinum/Nodes: Multiple subcentimeter bilateral thyroid nodules. These do not need imaging follow-up. Fluid and air in a dilated proximal esophagus, previously filled with air. No enlarged lymph nodes. Lungs/Pleura: Chronic left lower lobe bronchiectasis and mucous plugging with associated atelectasis. No significant change in bilateral pleural and parenchymal scarring. Mild additional pleural based scarring or atelectasis bilaterally. No pleural fluid. Upper Abdomen: Atheromatous arterial calcifications. Musculoskeletal: Thoracic and lower cervical spine degenerative changes. Review of the MIP images confirms the above findings. IMPRESSION: 1. No pulmonary emboli. 2. 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 Recommend annual imaging followup by CTA or MRA. This recommendation follows 2010 ACCF/AHA/AATS/ACR/ASA/SCA/SCAI/SIR/STS/SVM Guidelines for the Diagnosis and Management of Patients with Thoracic Aortic Disease. Circulation. 2010; 121: Z733-z630. Aortic aneurysm NOS (ICD10-I71.9) 3. Chronic left lower lobe bronchiectasis and mucous plugging with associated atelectasis. 4. Stable bilateral pleural and parenchymal scarring. 5. Stable cardiomegaly and small pericardial effusion. 6. Calcific coronary artery and aortic atherosclerosis. Aortic Atherosclerosis (ICD10-I70.0). Electronically Signed   By: Elspeth Bathe M.D.   On: 03/26/2024 11:40   ECHOCARDIOGRAM COMPLETE Result Date: 03/26/2024    ECHOCARDIOGRAM REPORT   Patient Name:   [facility] Date of Exam: 03/26/2024 Medical Rec #:  [id number]    Height:       60.0 in Accession #:    [id number]   Weight:       114.3 lb Date of Birth:  05/13/1956   BSA:          1.472 m Patient Age:    67 years     BP:           166/50 mmHg Patient Gender: F            HR:           77 bpm. Exam Location:  [facility] Procedure: 2D Echo,  Cardiac Doppler and Color Doppler (Both Spectral and Color            Flow Doppler were utilized during procedure). Indications:     NSTEMI  History:         Patient has prior history of Echocardiogram examinations, most                  recent 01/14/2024. CHF, COPD, Aortic Valve Disease; Risk                  Factors:Hypertension and Diabetes. CKD.  Sonographer:     Philomena Daring Referring Phys:  [id number] AMY N COX Diagnosing Phys: Evalene Lunger MD IMPRESSIONS  1. Left ventricular ejection fraction,  by estimation, is 60 to 65%. The left ventricle has normal function. The left ventricle has no regional wall motion abnormalities. There is moderate left ventricular hypertrophy. Left ventricular diastolic parameters are consistent with Grade I diastolic dysfunction (impaired relaxation).  2. Right ventricular systolic function is normal. The right ventricular size is normal. There is severely elevated pulmonary artery systolic pressure. The estimated right ventricular systolic pressure is 68.4 mmHg.  3. The mitral valve is normal in structure. Mild mitral valve regurgitation. Moderate mitral stenosis. The mean mitral valve gradient is 10.0 mmHg. Moderate mitral annular calcification.  4. Tricuspid valve regurgitation is moderate.  5. The aortic valve is normal in structure. There is severe calcifcation of the aortic valve. Aortic valve regurgitation is mild to moderate. Severe aortic valve stenosis. Aortic valve area, by VTI measures 0.85 cm. Aortic valve mean gradient measures 36.5 mmHg. Aortic valve Vmax measures 4.02 m/s.  6. The inferior vena cava is normal in size with greater than 50% respiratory variability, suggesting right atrial pressure of 3 mmHg. FINDINGS  Left Ventricle: Left ventricular ejection fraction, by estimation, is 60 to 65%. The left ventricle has normal function. The left ventricle has no regional wall motion abnormalities. Strain was performed and the global longitudinal strain is indeterminate.  The left ventricular internal cavity size was normal in size. There is moderate left ventricular hypertrophy. Left ventricular diastolic parameters are consistent with Grade I diastolic dysfunction (impaired relaxation). Right Ventricle: The right ventricular size is normal. No increase in right ventricular wall thickness. Right ventricular systolic function is normal. There is severely elevated pulmonary artery systolic pressure. The tricuspid regurgitant velocity is 3.98 m/s, and with an assumed right atrial pressure of 5 mmHg, the estimated right ventricular systolic pressure is 68.4 mmHg. Left Atrium: Left atrial size was normal in size. Right Atrium: Right atrial size was normal in size. Pericardium: There is no evidence of pericardial effusion. Mitral Valve: The mitral valve is normal in structure. There is moderate calcification of the mitral valve leaflet(s). Moderate mitral annular calcification. Mild mitral valve regurgitation. Moderate mitral valve stenosis. MV peak gradient, 18.8 mmHg. The mean mitral valve gradient is 10.0 mmHg. Tricuspid Valve: The tricuspid valve is normal in structure. Tricuspid valve regurgitation is moderate . No evidence of tricuspid stenosis. Aortic Valve: The aortic valve is normal in structure. There is severe calcifcation of the aortic valve. Aortic valve regurgitation is mild to moderate. Severe aortic stenosis is present. Aortic valve mean gradient measures 36.5 mmHg. Aortic valve peak gradient measures 64.6 mmHg. Aortic valve area, by VTI measures 0.85 cm. Pulmonic Valve: The pulmonic valve was normal in structure. Pulmonic valve regurgitation is not visualized. No evidence of pulmonic stenosis. Aorta: The aortic root is normal in size and structure. Venous: The inferior vena cava is normal in size with greater than 50% respiratory variability, suggesting right atrial pressure of 3 mmHg. IAS/Shunts: No atrial level shunt detected by color flow Doppler. Additional Comments:  3D was performed not requiring image post processing on an independent workstation and was indeterminate.  LEFT VENTRICLE PLAX 2D LVIDd:         3.60 cm   Diastology LVIDs:         2.30 cm   LV e' medial:    5.77 cm/s LV PW:         1.20 cm   LV E/e' medial:  22.5 LV IVS:        1.50 cm   LV e' lateral:   4.90  cm/s LVOT diam:     1.80 cm   LV E/e' lateral: 26.5 LV SV:         69 LV SV Index:   47 LVOT Area:     2.54 cm  RIGHT VENTRICLE             IVC RV Basal diam:  3.40 cm     IVC diam: 1.40 cm RV Mid diam:    2.70 cm RV S prime:     10.70 cm/s TAPSE (M-mode): 2.2 cm LEFT ATRIUM             Index        RIGHT ATRIUM           Index LA diam:        3.00 cm 2.04 cm/m   RA Area:     15.60 cm LA Vol (A2C):   51.5 ml 35.00 ml/m  RA Volume:   36.40 ml  24.74 ml/m LA Vol (A4C):   42.0 ml 28.54 ml/m LA Biplane Vol: 48.4 ml 32.89 ml/m  AORTIC VALVE AV Area (Vmax):    0.81 cm AV Area (Vmean):   0.80 cm AV Area (VTI):     0.85 cm AV Vmax:           402.00 cm/s AV Vmean:          281.000 cm/s AV VTI:            0.807 m AV Peak Grad:      64.6 mmHg AV Mean Grad:      36.5 mmHg LVOT Vmax:         128.00 cm/s LVOT Vmean:        87.900 cm/s LVOT VTI:          0.270 m LVOT/AV VTI ratio: 0.33  AORTA Ao Root diam: 2.90 cm MITRAL VALVE                TRICUSPID VALVE MV Area (PHT): 3.42 cm     TR Peak grad:   63.4 mmHg MV Area VTI:   1.49 cm     TR Vmax:        398.00 cm/s MV Peak grad:  18.8 mmHg MV Mean grad:  10.0 mmHg    SHUNTS MV Vmax:       2.17 m/s     Systemic VTI:  0.27 m MV Vmean:      152.0 cm/s   Systemic Diam: 1.80 cm MV Decel Time: 222 msec MV E velocity: 130.00 cm/s MV A velocity: 206.00 cm/s MV E/A ratio:  0.63 Evalene Lunger MD Electronically signed by Evalene Lunger MD Signature Date/Time: 03/26/2024/11:39:41 AM    Final    DG Chest Port 1 View Result Date: 03/26/2024 EXAM: 1 VIEW(S) XRAY OF THE CHEST 03/26/2024 08:50:18 AM COMPARISON: None available. CLINICAL HISTORY: Dyspnea. FINDINGS: LINES, TUBES  AND DEVICES: Telemetry leads overlie the chest. LUNGS AND PLEURA: Left base atelectasis or infiltrate. Tiny bilateral effusions. No pneumothorax. HEART AND MEDIASTINUM: Cardiopericardial silhouette is enlarged. BONES AND SOFT TISSUES: No acute bony abnormality. IMPRESSION: 1. Left basilar atelectasis versus infiltrate with tiny bilateral pleural effusions. 2. Cardiomegaly. Electronically signed by: Camellia Candle MD 03/26/2024 10:35 AM EST RP Workstation: [id number]   DG Pelvis 1-2 Views Result Date: 03/25/2024 EXAM: 1 OR 2 VIEW(S) XRAY OF THE PELVIS 03/25/2024 05:19:00 PM COMPARISON: None available. CLINICAL HISTORY: fall, pain FINDINGS: BONES AND JOINTS: Significant soft tissue artifact overlying the lower pelvis. No definite pubic  rami fractures. The pubic symphysis and si joints are intact. No definite hip fractures. Advanced vascular disease. Degenerative changes of the lower lumbar spine. No focal osseous lesion. SOFT TISSUES: Vascular calcifications. IMPRESSION: 1. No acute hip or pelvic fractures 2. Atherosclerotic vascular calcifications. Electronically signed by: Maude Stammer MD 03/25/2024 06:07 PM EST RP Workstation: [id number]   CT HEAD WO CONTRAST ( ) Result Date: 03/25/2024 CLINICAL DATA:  Head and neck trauma fell in bathroom EXAM: CT HEAD WITHOUT CONTRAST CT CERVICAL SPINE WITHOUT CONTRAST TECHNIQUE: Multidetector CT imaging of the head and cervical spine was performed following the standard protocol without intravenous contrast. Multiplanar CT image reconstructions of the cervical spine were also generated. RADIATION DOSE REDUCTION: This exam was performed according to the departmental dose-optimization program which includes automated exposure control, adjustment of the mA and/or kV according to patient size and/or use of iterative reconstruction technique. COMPARISON:  CT brain 02/18/2024, MRI 02/01/2024, CT brain and cervical spine 01/14/2024 FINDINGS: CT HEAD FINDINGS Brain: No acute  territorial infarction, hemorrhage or intracranial mass. Minimal encephalomalacia at the right temporal occipital junction at the site of prior hemorrhage. Atrophy and mild chronic small vessel ischemic changes of the white matter. Nonenlarged ventricles Vascular: No hyperdense vessels.  Carotid vascular calcification Skull: Normal. Negative for fracture or focal lesion. Sinuses/Orbits: No acute finding. Other: Small right forehead scalp hematoma CT CERVICAL SPINE FINDINGS Alignment: Facet alignment within normal limits. Mild reversal of cervical lordosis. Trace retrolisthesis C6 on C7. Skull base and vertebrae: No acute fracture. No primary bone lesion or focal pathologic process. Soft tissues and spinal canal: No prevertebral fluid or swelling. No visible canal hematoma. Disc levels: Multilevel degenerative change. Advanced disc space narrowing C5-C6 and C6-C7. At least mild canal stenosis C5-C6 secondary to posterior disc osteophyte complex. Upper chest: No acute finding. Ten year right thyroid calcification, no specific imaging follow-up recommended. Other: Carotid vascular calcification IMPRESSION: 1. No CT evidence for acute intracranial abnormality. Atrophy and chronic small vessel ischemic changes of the white matter. Small right forehead scalp hematoma. 2. Reversal of cervical lordosis with degenerative changes. No acute osseous abnormality. Electronically Signed   By: Luke Bun M.D.   On: 03/25/2024 16:54   CT Cervical Spine Wo Contrast Result Date: 03/25/2024 CLINICAL DATA:  Head and neck trauma fell in bathroom EXAM: CT HEAD WITHOUT CONTRAST CT CERVICAL SPINE WITHOUT CONTRAST TECHNIQUE: Multidetector CT imaging of the head and cervical spine was performed following the standard protocol without intravenous contrast. Multiplanar CT image reconstructions of the cervical spine were also generated. RADIATION DOSE REDUCTION: This exam was performed according to the departmental dose-optimization  program which includes automated exposure control, adjustment of the mA and/or kV according to patient size and/or use of iterative reconstruction technique. COMPARISON:  CT brain 02/18/2024, MRI 02/01/2024, CT brain and cervical spine 01/14/2024 FINDINGS: CT HEAD FINDINGS Brain: No acute territorial infarction, hemorrhage or intracranial mass. Minimal encephalomalacia at the right temporal occipital junction at the site of prior hemorrhage. Atrophy and mild chronic small vessel ischemic changes of the white matter. Nonenlarged ventricles Vascular: No hyperdense vessels.  Carotid vascular calcification Skull: Normal. Negative for fracture or focal lesion. Sinuses/Orbits: No acute finding. Other: Small right forehead scalp hematoma CT CERVICAL SPINE FINDINGS Alignment: Facet alignment within normal limits. Mild reversal of cervical lordosis. Trace retrolisthesis C6 on C7. Skull base and vertebrae: No acute fracture. No primary bone lesion or focal pathologic process. Soft tissues and spinal canal: No prevertebral fluid or swelling. No visible canal hematoma. Disc  levels: Multilevel degenerative change. Advanced disc space narrowing C5-C6 and C6-C7. At least mild canal stenosis C5-C6 secondary to posterior disc osteophyte complex. Upper chest: No acute finding. Ten year right thyroid calcification, no specific imaging follow-up recommended. Other: Carotid vascular calcification IMPRESSION: 1. No CT evidence for acute intracranial abnormality. Atrophy and chronic small vessel ischemic changes of the white matter. Small right forehead scalp hematoma. 2. Reversal of cervical lordosis with degenerative changes. No acute osseous abnormality. Electronically Signed   By: Luke Bun M.D.   On: 03/25/2024 16:54      Discharge Exam: Vitals:   03/31/24 0921 03/31/24 1408  BP: (!) 170/57 (!) 147/55  Pulse: 64 60  Resp:  16  Temp:  98.1 F (36.7 C)  SpO2:  100%   Vitals:   03/31/24 0810 03/31/24 0921 03/31/24  0944 03/31/24 1408  BP: (!) 170/57 (!) 170/57  (!) 147/55  Pulse: 64 64  60  Resp: 15   16  Temp:    98.1 F (36.7 C)  TempSrc:    Oral  SpO2: 95%   100%  Weight:   54.2 kg   Height:        Constitutional:  Normal appearance. Non toxic-appearing.  HENT: Head Normocephalic and atraumatic.  Mucous membranes are moist.  Eyes:  Extraocular intact. Conjunctivae normal.  Cardiovascular: Rate and Rhythm: Normal rate and regular rhythm.  Pulmonary: Non labored, symmetric rise of chest wall.  Skin: warm and dry. not jaundiced.  Neurological: No focal deficit present. alert. Oriented.  Psychiatric: Mood and Affect congruent.    The results of significant diagnostics from this hospitalization (including imaging, microbiology, ancillary and laboratory) are listed below for reference.     Microbiology: Recent Results (from the past 240 hours)  Resp panel by RT-PCR (RSV, Flu A&B, Covid) Anterior Nasal Swab     Status: None   Collection Time: 03/25/24  6:53 PM   Specimen: Anterior Nasal Swab  Result Value Ref Range Status   SARS Coronavirus 2 by RT PCR NEGATIVE NEGATIVE Final    Comment: (NOTE) SARS-CoV-2 target nucleic acids are NOT DETECTED.  The SARS-CoV-2 RNA is generally detectable in upper respiratory specimens during the acute phase of infection. The lowest concentration of SARS-CoV-2 viral copies this assay can detect is 138 copies/mL. A negative result does not preclude SARS-Cov-2 infection and should not be used as the sole basis for treatment or other patient management decisions. A negative result may occur with  improper specimen collection/handling, submission of specimen other than nasopharyngeal swab, presence of viral mutation(s) within the areas targeted by this assay, and inadequate number of viral copies(<138 copies/mL). A negative result must be combined with clinical observations, patient history, and epidemiological information. The expected result is  Negative.  Fact Sheet for Patients:  bloggercourse.com  Fact Sheet for Healthcare Providers:  seriousbroker.it  This test is no t yet approved or cleared by the [place]  FDA and  has been authorized for detection and/or diagnosis of SARS-CoV-2 by FDA under an Emergency Use Authorization (EUA). This EUA will remain  in effect (meaning this test can be used) for the duration of the COVID-19 declaration under Section 564(b)(1) of the Act, 21 U.S.C.section 360bbb-3(b)(1), unless the authorization is terminated  or revoked sooner.       Influenza A by PCR NEGATIVE NEGATIVE Final   Influenza B by PCR NEGATIVE NEGATIVE Final    Comment: (NOTE) The Xpert Xpress SARS-CoV-2/FLU/RSV plus assay is intended as an aid in the  diagnosis of influenza from Nasopharyngeal swab specimens and should not be used as a sole basis for treatment. Nasal washings and aspirates are unacceptable for Xpert Xpress SARS-CoV-2/FLU/RSV testing.  Fact Sheet for Patients: bloggercourse.com  Fact Sheet for Healthcare Providers: seriousbroker.it  This test is not yet approved or cleared by the [place]  FDA and has been authorized for detection and/or diagnosis of SARS-CoV-2 by FDA under an Emergency Use Authorization (EUA). This EUA will remain in effect (meaning this test can be used) for the duration of the COVID-19 declaration under Section 564(b)(1) of the Act, 21 U.S.C. section 360bbb-3(b)(1), unless the authorization is terminated or revoked.     Resp Syncytial Virus by PCR NEGATIVE NEGATIVE Final    Comment: (NOTE) Fact Sheet for Patients: bloggercourse.com  Fact Sheet for Healthcare Providers: seriousbroker.it  This test is not yet approved or cleared by the [place]  FDA and has been authorized for detection and/or diagnosis of  SARS-CoV-2 by FDA under an Emergency Use Authorization (EUA). This EUA will remain in effect (meaning this test can be used) for the duration of the COVID-19 declaration under Section 564(b)(1) of the Act, 21 U.S.C. section 360bbb-3(b)(1), unless the authorization is terminated or revoked.  Performed at [facility], [address] [place], [place] [address]      Labs: BNP (last 3 results) Recent Labs    01/13/24 2259  BNP 1,301.5*   Basic Metabolic Panel: Recent Labs  Lab 03/25/24 1504 03/26/24 0204 03/27/24 0716 03/30/24 0653  NA 141 138 135 135  K 3.9 3.8 3.9 5.0  CL 100 100 97* 100  CO2 30 31 27 25   GLUCOSE 76 85 111* 91  BUN 30* 39* 59* 75*  CREATININE 2.13* 2.56* 3.79* 4.88*  CALCIUM  9.1 8.4* 9.0 8.8*  PHOS  --   --  3.8 4.5   Liver Function Tests: Recent Labs  Lab 03/25/24 1504 03/27/24 0716 03/30/24 0653  AST 50*  --   --   ALT 60*  --   --   ALKPHOS 121  --   --   BILITOT 0.6  --   --   PROT 6.6  --   --   ALBUMIN  3.3* 2.9* 2.7*   No results for input(s): LIPASE, AMYLASE in the last 168 hours. No results for input(s): AMMONIA in the last 168 hours. CBC: Recent Labs  Lab 03/26/24 0204 03/27/24 0540 03/28/24 0541 03/29/24 0506 03/30/24 0530  WBC 7.1 6.1 6.0 5.4 5.3  HGB 10.1* 9.8* 10.4* 10.1* 9.9*  HCT 32.0* 30.0* 31.9* 30.9* 31.5*  MCV 104.6* 101.0* 102.2* 102.3* 104.7*  PLT 104* 105* 113* 114* 117*   Cardiac Enzymes: No results for input(s): CKTOTAL, CKMB, CKMBINDEX, TROPONINI in the last 168 hours. BNP: Invalid input(s): POCBNP CBG: Recent Labs  Lab 03/27/24 0507 03/28/24 0617 03/30/24 0516  GLUCAP 94 103* 93   D-Dimer No results for input(s): DDIMER in the last 72 hours. Hgb A1c No results for input(s): HGBA1C in the last 72 hours. Lipid Profile No results for input(s): CHOL, HDL, LDLCALC, TRIG, CHOLHDL, LDLDIRECT in the last 72 hours. Thyroid function studies No results for  input(s): TSH, T4TOTAL, T3FREE, THYROIDAB in the last 72 hours.  Invalid input(s): FREET3 Anemia work up No results for input(s): VITAMINB12, FOLATE, FERRITIN, TIBC, IRON, RETICCTPCT in the last 72 hours. Urinalysis    Component Value Date/Time   COLORURINE YELLOW (A) 01/31/2024 1206   APPEARANCEUR CLEAR (A) 01/31/2024 1206   LABSPEC 1.014 01/31/2024 1206  PHURINE 6.0 01/31/2024 1206   GLUCOSEU NEGATIVE 01/31/2024 1206   HGBUR NEGATIVE 01/31/2024 1206   BILIRUBINUR NEGATIVE 01/31/2024 1206   KETONESUR NEGATIVE 01/31/2024 1206   PROTEINUR 100 (A) 01/31/2024 1206   NITRITE NEGATIVE 01/31/2024 1206   LEUKOCYTESUR SMALL (A) 01/31/2024 1206   Sepsis Labs Recent Labs  Lab 03/27/24 0540 03/28/24 0541 03/29/24 0506 03/30/24 0530  WBC 6.1 6.0 5.4 5.3   Microbiology Recent Results (from the past 240 hours)  Resp panel by RT-PCR (RSV, Flu A&B, Covid) Anterior Nasal Swab     Status: None   Collection Time: 03/25/24  6:53 PM   Specimen: Anterior Nasal Swab  Result Value Ref Range Status   SARS Coronavirus 2 by RT PCR NEGATIVE NEGATIVE Final    Comment: (NOTE) SARS-CoV-2 target nucleic acids are NOT DETECTED.  The SARS-CoV-2 RNA is generally detectable in upper respiratory specimens during the acute phase of infection. The lowest concentration of SARS-CoV-2 viral copies this assay can detect is 138 copies/mL. A negative result does not preclude SARS-Cov-2 infection and should not be used as the sole basis for treatment or other patient management decisions. A negative result may occur with  improper specimen collection/handling, submission of specimen other than nasopharyngeal swab, presence of viral mutation(s) within the areas targeted by this assay, and inadequate number of viral copies(<138 copies/mL). A negative result must be combined with clinical observations, patient history, and epidemiological information. The expected result is Negative.  Fact  Sheet for Patients:  bloggercourse.com  Fact Sheet for Healthcare Providers:  seriousbroker.it  This test is no t yet approved or cleared by the [place]  FDA and  has been authorized for detection and/or diagnosis of SARS-CoV-2 by FDA under an Emergency Use Authorization (EUA). This EUA will remain  in effect (meaning this test can be used) for the duration of the COVID-19 declaration under Section 564(b)(1) of the Act, 21 U.S.C.section 360bbb-3(b)(1), unless the authorization is terminated  or revoked sooner.       Influenza A by PCR NEGATIVE NEGATIVE Final   Influenza B by PCR NEGATIVE NEGATIVE Final    Comment: (NOTE) The Xpert Xpress SARS-CoV-2/FLU/RSV plus assay is intended as an aid in the diagnosis of influenza from Nasopharyngeal swab specimens and should not be used as a sole basis for treatment. Nasal washings and aspirates are unacceptable for Xpert Xpress SARS-CoV-2/FLU/RSV testing.  Fact Sheet for Patients: bloggercourse.com  Fact Sheet for Healthcare Providers: seriousbroker.it  This test is not yet approved or cleared by the [place]  FDA and has been authorized for detection and/or diagnosis of SARS-CoV-2 by FDA under an Emergency Use Authorization (EUA). This EUA will remain in effect (meaning this test can be used) for the duration of the COVID-19 declaration under Section 564(b)(1) of the Act, 21 U.S.C. section 360bbb-3(b)(1), unless the authorization is terminated or revoked.     Resp Syncytial Virus by PCR NEGATIVE NEGATIVE Final    Comment: (NOTE) Fact Sheet for Patients: bloggercourse.com  Fact Sheet for Healthcare Providers: seriousbroker.it  This test is not yet approved or cleared by the [place]  FDA and has been authorized for detection and/or diagnosis of SARS-CoV-2 by FDA under an  Emergency Use Authorization (EUA). This EUA will remain in effect (meaning this test can be used) for the duration of the COVID-19 declaration under Section 564(b)(1) of the Act, 21 U.S.C. section 360bbb-3(b)(1), unless the authorization is terminated or revoked.  Performed at [facility], [address], [place], [place] [address]  Time coordinating discharge: 32 min   SIGNED: Demoni Parmar, DO [facility] 03/31/2024, 2:23 PM Pager   If 7PM-7AM, please contact night-coverage

## 2024-04-13 ENCOUNTER — Other Ambulatory Visit: Payer: Self-pay

## 2024-04-13 ENCOUNTER — Observation Stay: Admission: EM | Admit: 2024-04-13 | Discharge: 2024-05-14 | DRG: 070 | Disposition: E | Source: Skilled Nursing Facility

## 2024-04-13 ENCOUNTER — Emergency Department

## 2024-04-13 DIAGNOSIS — G9341 Metabolic encephalopathy: Secondary | ICD-10-CM

## 2024-04-13 DIAGNOSIS — I350 Nonrheumatic aortic (valve) stenosis: Secondary | ICD-10-CM

## 2024-04-13 DIAGNOSIS — I10 Essential (primary) hypertension: Secondary | ICD-10-CM | POA: Diagnosis present

## 2024-04-13 DIAGNOSIS — N186 End stage renal disease: Secondary | ICD-10-CM

## 2024-04-13 DIAGNOSIS — R4182 Altered mental status, unspecified: Principal | ICD-10-CM

## 2024-04-13 DIAGNOSIS — E119 Type 2 diabetes mellitus without complications: Secondary | ICD-10-CM

## 2024-04-13 DIAGNOSIS — R001 Bradycardia, unspecified: Secondary | ICD-10-CM | POA: Diagnosis present

## 2024-04-13 DIAGNOSIS — E875 Hyperkalemia: Secondary | ICD-10-CM | POA: Diagnosis present

## 2024-04-13 DIAGNOSIS — G9340 Encephalopathy, unspecified: Secondary | ICD-10-CM | POA: Diagnosis present

## 2024-04-13 DIAGNOSIS — I4891 Unspecified atrial fibrillation: Secondary | ICD-10-CM | POA: Diagnosis present

## 2024-04-13 DIAGNOSIS — I5030 Unspecified diastolic (congestive) heart failure: Secondary | ICD-10-CM | POA: Insufficient documentation

## 2024-04-13 DIAGNOSIS — I469 Cardiac arrest, cause unspecified: Secondary | ICD-10-CM | POA: Diagnosis present

## 2024-04-13 DIAGNOSIS — N19 Unspecified kidney failure: Secondary | ICD-10-CM | POA: Insufficient documentation

## 2024-04-13 DIAGNOSIS — I959 Hypotension, unspecified: Secondary | ICD-10-CM | POA: Diagnosis present

## 2024-04-13 DIAGNOSIS — I4901 Ventricular fibrillation: Secondary | ICD-10-CM | POA: Diagnosis present

## 2024-04-13 LAB — CBC
HCT: 36.3 % (ref 36.0–46.0)
Hemoglobin: 11.4 g/dL — ABNORMAL LOW (ref 12.0–15.0)
MCH: 33 pg (ref 26.0–34.0)
MCHC: 31.4 g/dL (ref 30.0–36.0)
MCV: 105.2 fL — ABNORMAL HIGH (ref 80.0–100.0)
Platelets: 105 K/uL — ABNORMAL LOW (ref 150–400)
RBC: 3.45 MIL/uL — ABNORMAL LOW (ref 3.87–5.11)
RDW: 13 % (ref 11.5–15.5)
WBC: 6.8 K/uL (ref 4.0–10.5)
nRBC: 0 % (ref 0.0–0.2)

## 2024-04-13 LAB — BASIC METABOLIC PANEL WITH GFR
Anion gap: 25 — ABNORMAL HIGH (ref 5–15)
BUN: 128 mg/dL — ABNORMAL HIGH (ref 8–23)
CO2: 20 mmol/L — ABNORMAL LOW (ref 22–32)
Calcium: 9.3 mg/dL (ref 8.9–10.3)
Chloride: 96 mmol/L — ABNORMAL LOW (ref 98–111)
Creatinine, Ser: 8.53 mg/dL — ABNORMAL HIGH (ref 0.44–1.00)
GFR, Estimated: 5 mL/min — ABNORMAL LOW (ref >60)
Glucose, Bld: 71 mg/dL (ref 70–99)
Potassium: 5.9 mmol/L — ABNORMAL HIGH (ref 3.5–5.1)
Sodium: 140 mmol/L (ref 135–145)

## 2024-04-13 LAB — CBG MONITORING, ED
Glucose-Capillary: 67 mg/dL — ABNORMAL LOW (ref 70–99)
Glucose-Capillary: 130 mg/dL — ABNORMAL HIGH (ref 70–99)

## 2024-04-13 LAB — MAGNESIUM: Magnesium: 2.4 mg/dL (ref 1.7–2.4)

## 2024-04-13 LAB — GLUCOSE, CAPILLARY
Glucose-Capillary: 62 mg/dL — ABNORMAL LOW (ref 70–99)
Glucose-Capillary: 105 mg/dL — ABNORMAL HIGH (ref 70–99)

## 2024-04-13 LAB — PHOSPHORUS: Phosphorus: 9.1 mg/dL — ABNORMAL HIGH (ref 2.5–4.6)

## 2024-04-13 MED ORDER — SODIUM ZIRCONIUM CYCLOSILICATE 10 G PO PACK
10.0000 g | PACK | Freq: Once | ORAL | Status: AC
  Administered 2024-04-13: 10 g via ORAL
  Filled 2024-04-13: qty 1

## 2024-04-13 MED ORDER — METOPROLOL TARTRATE 25 MG PO TABS
12.5000 mg | ORAL_TABLET | Freq: Two times a day (BID) | ORAL | Status: DC
  Administered 2024-04-13: 12.5 mg via ORAL
  Filled 2024-04-13 (×2): qty 1

## 2024-04-13 MED ORDER — GLUCOSE 4 G PO CHEW
CHEWABLE_TABLET | ORAL | Status: AC
  Filled 2024-04-13: qty 1

## 2024-04-13 MED ORDER — INSULIN ASPART 100 UNIT/ML IV SOLN
5.0000 [IU] | Freq: Once | INTRAVENOUS | Status: AC
  Administered 2024-04-13: 5 [IU] via INTRAVENOUS
  Filled 2024-04-13: qty 5

## 2024-04-13 MED ORDER — HEPARIN SODIUM (PORCINE) 5000 UNIT/ML IJ SOLN
5000.0000 [IU] | Freq: Three times a day (TID) | INTRAMUSCULAR | Status: DC
  Administered 2024-04-13: 5000 [IU] via SUBCUTANEOUS
  Filled 2024-04-13: qty 1

## 2024-04-13 MED ORDER — HYDRALAZINE HCL 20 MG/ML IJ SOLN: 10.0000 mg | Freq: Four times a day (QID) | INTRAMUSCULAR | Status: DC | PRN

## 2024-04-13 MED ORDER — PANTOPRAZOLE SODIUM 40 MG PO TBEC
40.0000 mg | DELAYED_RELEASE_TABLET | Freq: Every day | ORAL | Status: DC
  Administered 2024-04-14: 40 mg via ORAL
  Filled 2024-04-13: qty 1

## 2024-04-13 MED ORDER — DEXTROSE 50 % IV SOLN
1.0000 | Freq: Once | INTRAVENOUS | Status: AC
  Administered 2024-04-13: 50 mL via INTRAVENOUS
  Filled 2024-04-13: qty 50

## 2024-04-13 MED ORDER — LABETALOL HCL 5 MG/ML IV SOLN: 10.0000 mg | INTRAVENOUS | Status: DC | PRN

## 2024-04-13 MED ORDER — DEXTROSE 50 % IV SOLN
INTRAVENOUS | Status: AC
  Administered 2024-04-13: 50 mL via INTRAVENOUS
  Filled 2024-04-13: qty 50

## 2024-04-13 MED ORDER — TOPIRAMATE 25 MG PO TABS: 50.0000 mg | ORAL_TABLET | Freq: Two times a day (BID) | ORAL | Status: DC | PRN

## 2024-04-13 MED ORDER — CHLORHEXIDINE GLUCONATE CLOTH 2 % EX PADS
6.0000 | MEDICATED_PAD | Freq: Every day | CUTANEOUS | Status: DC
  Administered 2024-04-14 – 2024-04-16 (×3): 6 via TOPICAL
  Filled 2024-04-13: qty 6

## 2024-04-13 MED ORDER — ASPIRIN 81 MG PO CHEW
81.0000 mg | CHEWABLE_TABLET | Freq: Every day | ORAL | Status: DC
  Administered 2024-04-14: 81 mg via ORAL
  Filled 2024-04-13: qty 1

## 2024-04-13 MED ORDER — OXYCODONE HCL 5 MG PO TABS
5.0000 mg | ORAL_TABLET | Freq: Four times a day (QID) | ORAL | Status: DC | PRN
  Administered 2024-04-13: 5 mg via ORAL
  Filled 2024-04-13: qty 1

## 2024-04-13 MED ORDER — SALINE SPRAY 0.65 % NA SOLN: 1.0000 | NASAL | Status: DC | PRN

## 2024-04-13 MED ORDER — CALCIUM GLUCONATE-NACL 1-0.675 GM/50ML-% IV SOLN
1.0000 g | Freq: Once | INTRAVENOUS | Status: AC
  Administered 2024-04-13: 1000 mg via INTRAVENOUS
  Filled 2024-04-13: qty 50

## 2024-04-13 MED ORDER — MIDODRINE HCL 5 MG PO TABS: 5.0000 mg | ORAL_TABLET | ORAL | Status: DC

## 2024-04-13 MED ORDER — TRAZODONE HCL 50 MG PO TABS
75.0000 mg | ORAL_TABLET | Freq: Every day | ORAL | Status: DC
  Administered 2024-04-13: 75 mg via ORAL
  Filled 2024-04-13: qty 2

## 2024-04-13 MED ORDER — MUSCLE RUB 10-15 % EX CREA: 1.0000 | TOPICAL_CREAM | CUTANEOUS | Status: DC | PRN

## 2024-04-13 NOTE — Progress Notes (Signed)
   04/13/24 1622  Vitals  BP (!) 144/48  Pulse Rate 89  ECG Heart Rate 89  Resp 19  Weight 61 kg  Type of Weight Post-Dialysis  Oxygen Therapy  SpO2 99 %  O2 Device Room Air  Patient Activity (if Appropriate) In bed  Pulse Oximetry Type Continuous  Oximetry Probe Site Changed No  Post Treatment  Dialyzer Clearance Clear  Hemodialysis Intake (mL) 0 mL  Fluid Removed (mL) 2000 mL  Tolerated HD Treatment Yes  Post-Hemodialysis Comments Vs have remained stable. Access is positive for thrill and bruit. No apparent distress observed. Patient responds to her name only. Hemostasis has been achieved. No prolonged bleeding.  AVG/AVF Arterial Site Held (minutes) 5 minutes  AVG/AVF Venous Site Held (minutes) 5 minutes

## 2024-04-13 NOTE — Progress Notes (Signed)
 [facility]  ROUNDING NOTE   Subjective:   Katie [name]  is a 68 y.o.  female with past medical history of ESRD with hemodialysis, diabetes mellitus, COPD, and hypertension. Patient presents to ED for confusion. She is admitted for Hyperkalemia [E87.5] Uremia [N19] Encephalopathy [G93.40] Altered mental status, unspecified altered mental status type [R41.82]  Patient is known to our practice and receives outpatient dialysis treatments at [facility] on a MWF schedule. Last treatment received on Nov 21. According to staff, patient stated she didn't have to go anymore. Patient seen and evaluated during dialysis.    HEMODIALYSIS FLOWSHEET:  Blood Flow Rate (mL/min): 350 mL/min Arterial Pressure (mmHg): -157.16 mmHg Venous Pressure (mmHg): 229.68 mmHg TMP (mmHg): 9.09 mmHg Ultrafiltration Rate (mL/min): 767 mL/min Dialysate Flow Rate (mL/min): 299 ml/min  Somnolent on 3L [place], responsive to pain.   Labs on ED arrival concerning for potassium 5.9, s bicarb 20, BUN 128, creatinine 8.53 with GFR 5. Chest xray negative for acute findings. CT head negative.   We have been consulted to manage dialysis needs.    Objective:  Vital signs in last 24 hours:  Temp:  [97.1 F (36.2 C)-97.9 F (36.6 C)] 97.1 F (36.2 C) (12/01 1159) Pulse Rate:  [81-90] 86 (12/01 1430) Resp:  [11-32] 25 (12/01 1430) BP: (115-162)/(37-67) 117/37 (12/01 1430) SpO2:  [90 %-100 %] 97 % (12/01 1430) Weight:  [62.6 kg] 62.6 kg (12/01 1159)  Weight change:  Filed Weights   04/13/24 1159  Weight: 62.6 kg    Intake/Output: No intake/output data recorded.   Intake/Output this shift:  No intake/output data recorded.  Physical Exam: General: Critically ill appearing  Head: Normocephalic, atraumatic.   Eyes: Anicteric  Lungs:  Wheeze, rhonchi  Heart: Regular rate and rhythm  Abdomen:  Soft, nontender  Extremities:  No peripheral edema.  Neurologic: Somnolent  Skin: Warm,dry,   Access:  Lt upper AVF    Basic Metabolic Panel: Recent Labs  Lab 04/13/24 0910  NA 140  K 5.9*  CL 96*  CO2 20*  GLUCOSE 71  BUN 128*  CREATININE 8.53*  CALCIUM  9.3  MG 2.4  PHOS 9.1*    Liver Function Tests: No results for input(s): AST, ALT, ALKPHOS, BILITOT, PROT, ALBUMIN  in the last 168 hours.  No results for input(s): LIPASE, AMYLASE in the last 168 hours. No results for input(s): AMMONIA in the last 168 hours.  CBC: Recent Labs  Lab 04/13/24 0910  WBC 6.8  HGB 11.4*  HCT 36.3  MCV 105.2*  PLT 105*    Cardiac Enzymes: No results for input(s): CKTOTAL, CKMB, CKMBINDEX, TROPONINI in the last 168 hours.  BNP: Invalid input(s): POCBNP  CBG: Recent Labs  Lab 04/13/24 0913 04/13/24 1026  GLUCAP 67* 130*    Microbiology: Results for orders placed or performed during the hospital encounter of 03/25/24  Resp panel by RT-PCR (RSV, Flu A&B, Covid) Anterior Nasal Swab     Status: None   Collection Time: 03/25/24  6:53 PM   Specimen: Anterior Nasal Swab  Result Value Ref Range Status   SARS Coronavirus 2 by RT PCR NEGATIVE NEGATIVE Final    Comment: (NOTE) SARS-CoV-2 target nucleic acids are NOT DETECTED.  The SARS-CoV-2 RNA is generally detectable in upper respiratory specimens during the acute phase of infection. The lowest concentration of SARS-CoV-2 viral copies this assay can detect is 138 copies/mL. A negative result does not preclude SARS-Cov-2 infection and should not be used as the sole basis for treatment  or other patient management decisions. A negative result may occur with  improper specimen collection/handling, submission of specimen other than nasopharyngeal swab, presence of viral mutation(s) within the areas targeted by this assay, and inadequate number of viral copies(<138 copies/mL). A negative result must be combined with clinical observations, patient history, and epidemiological information. The expected result is  Negative.  Fact Sheet for Patients:  bloggercourse.com  Fact Sheet for Healthcare Providers:  seriousbroker.it  This test is no t yet approved or cleared by the [place]  FDA and  has been authorized for detection and/or diagnosis of SARS-CoV-2 by FDA under an Emergency Use Authorization (EUA). This EUA will remain  in effect (meaning this test can be used) for the duration of the COVID-19 declaration under Section 564(b)(1) of the Act, 21 U.S.C.section 360bbb-3(b)(1), unless the authorization is terminated  or revoked sooner.       Influenza A by PCR NEGATIVE NEGATIVE Final   Influenza B by PCR NEGATIVE NEGATIVE Final    Comment: (NOTE) The Xpert Xpress SARS-CoV-2/FLU/RSV plus assay is intended as an aid in the diagnosis of influenza from Nasopharyngeal swab specimens and should not be used as a sole basis for treatment. Nasal washings and aspirates are unacceptable for Xpert Xpress SARS-CoV-2/FLU/RSV testing.  Fact Sheet for Patients: bloggercourse.com  Fact Sheet for Healthcare Providers: seriousbroker.it  This test is not yet approved or cleared by the [place]  FDA and has been authorized for detection and/or diagnosis of SARS-CoV-2 by FDA under an Emergency Use Authorization (EUA). This EUA will remain in effect (meaning this test can be used) for the duration of the COVID-19 declaration under Section 564(b)(1) of the Act, 21 U.S.C. section 360bbb-3(b)(1), unless the authorization is terminated or revoked.     Resp Syncytial Virus by PCR NEGATIVE NEGATIVE Final    Comment: (NOTE) Fact Sheet for Patients: bloggercourse.com  Fact Sheet for Healthcare Providers: seriousbroker.it  This test is not yet approved or cleared by the [place]  FDA and has been authorized for detection and/or diagnosis of  SARS-CoV-2 by FDA under an Emergency Use Authorization (EUA). This EUA will remain in effect (meaning this test can be used) for the duration of the COVID-19 declaration under Section 564(b)(1) of the Act, 21 U.S.C. section 360bbb-3(b)(1), unless the authorization is terminated or revoked.  Performed at [facility], [address] [place], [place] [address]     Coagulation Studies: No results for input(s): LABPROT, INR in the last 72 hours.   Urinalysis: No results for input(s): COLORURINE, LABSPEC, PHURINE, GLUCOSEU, HGBUR, BILIRUBINUR, KETONESUR, PROTEINUR, UROBILINOGEN, NITRITE, LEUKOCYTESUR in the last 72 hours.  Invalid input(s): APPERANCEUR    Imaging: CT Head Wo Contrast Result Date: 04/13/2024 EXAM: CT HEAD WITHOUT CONTRAST 04/13/2024 08:55:32 AM TECHNIQUE: CT of the head was performed without the administration of intravenous contrast. Automated exposure control, iterative reconstruction, and/or weight based adjustment of the mA/kV was utilized to reduce the radiation dose to as low as reasonably achievable. COMPARISON: CT of the head dated 03/25/2024. CLINICAL HISTORY: Mental status change, unknown cause. FINDINGS: BRAIN AND VENTRICLES: No acute hemorrhage. No evidence of acute infarct. No hydrocephalus. No extra-axial collection. No mass effect or midline shift. There is age-related atrophy and mild periventricular white matter disease. There are moderate calcifications within the carotid siphons and vertebral arteries. ORBITS: No acute abnormality. SINUSES: No acute abnormality. SOFT TISSUES AND SKULL: No acute soft tissue abnormality. No skull fracture. IMPRESSION: 1. No acute intracranial abnormality. 2. Age-related atrophy and mild periventricular white  matter disease. 3. Moderate calcifications within the carotid siphons and vertebral arteries. Electronically signed by: Evalene Coho MD 04/13/2024 09:27 AM EST RP Workstation:  [id number]   DG Chest Portable 1 View Result Date: 04/13/2024 CLINICAL DATA:  Dyspnea. EXAM: PORTABLE CHEST 1 VIEW COMPARISON:  03/26/2024 and CT 03/26/2024 FINDINGS: Stable blunting at both costophrenic angles. Stable retrocardiac densities. Patchy densities in the right lung are unchanged. Heart size is upper limits of normal. Atherosclerotic calcifications at the aortic arch. IMPRESSION: 1. No significant change since 03/26/2024. 2. Persistent left basilar densities. Small densities in the right lung are similar to recent chest CT. Electronically Signed   By: Juliene Balder M.D.   On: 04/13/2024 09:05      Medications:     aspirin   81 mg Oral Daily   Chlorhexidine  Gluconate Cloth  6 each Topical Q0600   heparin   5,000 Units Subcutaneous Q8H   metoprolol  tartrate  12.5 mg Oral BID   midodrine   5 mg Oral Q M,W,F   pantoprazole   40 mg Oral Daily   traZODone   75 mg Oral QHS   hydrALAZINE , labetalol , Muscle Rub, oxyCODONE , sodium chloride , topiramate   Assessment/ Plan:  Katie [name] is a 68 y.o.  female with past medical history of ESRD with hemodialysis, diabetes mellitus, COPD, and hypertension. Patient presents to ED with confusion. She is admitted for Hyperkalemia [E87.5] Uremia [N19] Encephalopathy [G93.40] Altered mental status, unspecified altered mental status type [R41.82]  [facility] [place]/MWF/Lt AVF   Hyperkalemia with end stage renal disease on hemodialysis. Last treatment received on Nov 21. Potassium 5.9. BUN 128. Will perform urgent dialysis due to hyperkalemia and uremia. Will likely require additional treatment tomorrow.   2. Anemia of chronic kidney disease Lab Results  Component Value Date   HGB 11.4 (L) 04/13/2024    Hgb 11.4, no need for ESA at this time  3. Secondary Hyperparathyroidism: with outpatient labs: PTH 304, phosphorus 4.9, calcium  8.3 on 03/23/24.   Lab Results  Component Value Date   PTH 70 (H) 05/12/2020   CALCIUM  9.3 04/13/2024   CAION  1.13 (L) 08/15/2023   PHOS 9.1 (H) 04/13/2024    Calcium  acceptable however, phos elevated. Should correct slowly with dialysis.   4. 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 Most recent hemoglobin A1c is 4.5 on 01/14/24.   Primary team will continue to manage      LOS: 0 Maygan Koeller 12/1/20253:32 PM

## 2024-04-13 NOTE — Progress Notes (Signed)
 Pt receives outpt HD at [facility] on MWF at 10:45am. Navigator following to assist with any HD needs.   Suzen Satchel Dialysis Navigator [phone]

## 2024-04-13 NOTE — ED Notes (Signed)
 Pt arrived to room 37 from dialysis at this time.

## 2024-04-13 NOTE — ED Provider Notes (Addendum)
 [facility] Provider Note    Event Date/Time   First MD Initiated Contact with Patient 04/13/24 0818     (approximate)   History   Altered Mental Status   HPI  Katie [name] is a 68 y.o. female past medical history significant for depression, anxiety, ESRD on HD MWF, COPD, hypertension, who presents to the emergency department with altered mental status.  Patient has not gone to dialysis over the past 1 week since the 21st.  Patient states that she has not been going to dialysis because she makes urine.  States that she was making urine previously.  Answering some questions appropriately but then not responding appropriately to other questions.  Denies any pain at this time.  States that she just does not feel good all over.  Denies any falls or trauma.  Denies any chest pain or shortness of breath.  Denies fever or chills.  Patient presented from [facility] with assisted living  On chart review patient had a recent hospitalization for syncope at dialysis and was discharged from the hospital on 03/31/2024 -patient had an elevated troponin at that time that was felt to be secondary to demand ischemia.  Concerned that they had a vasovagal episode during dialysis.  Found to have severe aortic stenosis.  Had a CTA at that time with no signs of pulmonary emboli.  Patient was evaluated and not felt to be a good candidate for TAVR.  Recommended palliative approach.  Cardiology was arranged for [facility] for second referral.  History of subdural and intraparenchymal hematoma.  History of an ascending thoracic aortic aneurysm     Physical Exam   Triage Vital Signs: ED Triage Vitals  Encounter Vitals Group     BP      Girls Systolic BP Percentile      Girls Diastolic BP Percentile      Boys Systolic BP Percentile      Boys Diastolic BP Percentile      Pulse      Resp      Temp      Temp src      SpO2      Weight      Height      Head Circumference      Peak Flow       Pain Score      Pain Loc      Pain Education      Exclude from Growth Chart     Most recent vital signs: Vitals:   04/13/24 0825 04/13/24 1045  BP: (!) 115/54 (!) 162/67  Pulse: 89   Resp: 12 16  Temp: 97.9 F (36.6 C)   SpO2: 95%     Physical Exam Constitutional:      Appearance: She is well-developed.  HENT:     Head: Atraumatic.  Eyes:     Conjunctiva/sclera: Conjunctivae normal.  Cardiovascular:     Rate and Rhythm: Regular rhythm.  Pulmonary:     Effort: No respiratory distress.  Abdominal:     General: There is no distension.  Musculoskeletal:        General: Normal range of motion.     Cervical back: Normal range of motion.     Comments: Left AV fistula with palpable thrill  Skin:    General: Skin is warm.  Neurological:     General: No focal deficit present.     Mental Status: She is alert. She is disoriented.     GCS:  GCS eye subscore is 4. GCS verbal subscore is 4. GCS motor subscore is 6.     Cranial Nerves: Cranial nerves 2-12 are intact.     Sensory: Sensation is intact.     Comments: Following simple commands.  Cranial nerves intact.  5/5 strength bilateral upper extremities.  5/5 strength to right lower extremity.  4/5 strength to left lower extremity.  EMS states that she is wheelchair-bound at baseline     IMPRESSION / MDM / ASSESSMENT AND PLAN / ED COURSE  I reviewed the triage vital signs and the nursing notes.  Differential diagnosis including metabolic encephalopathy, uremia, electrolyte abnormality, intracranial hemorrhage, subdural, infectious process   EKG  I, Clotilda Punter, the attending physician, personally viewed and interpreted this ECG.  Peaked T waves present, reading is a left bundle branch block.  Nonspecific ST changes, prolonged QRS at 179, this is not significant change when compared to prior EKG with no peaked T waves.  No tachycardic or bradycardic dysrhythmias while on cardiac telemetry.  RADIOLOGY I  independently reviewed imaging, my interpretation of imaging: CT scan of the head -no findings of intracranial hemorrhage.  CT scan of the head with no signs of intracranial hemorrhage  Chest x-ray -chest x-ray no signs of pneumonia, cardiomegaly but no obvious pulmonary edema.  Persistent left basilar density and small densities in the right lung similar to prior chest CT  LABS (all labs ordered are listed, but only abnormal results are displayed) Labs interpreted as -    Labs Reviewed  CBC - Abnormal; Notable for the following components:      Result Value   RBC 3.45 (*)    Hemoglobin 11.4 (*)    MCV 105.2 (*)    Platelets 105 (*)    All other components within normal limits  BASIC METABOLIC PANEL WITH GFR - Abnormal; Notable for the following components:   Potassium 5.9 (*)    Chloride 96 (*)    CO2 20 (*)    BUN 128 (*)    Creatinine, Ser 8.53 (*)    GFR, Estimated 5 (*)    Anion gap 25 (*)    All other components within normal limits  PHOSPHORUS - Abnormal; Notable for the following components:   Phosphorus 9.1 (*)    All other components within normal limits  CBG MONITORING, ED - Abnormal; Notable for the following components:   Glucose-Capillary 67 (*)    All other components within normal limits  CBG MONITORING, ED - Abnormal; Notable for the following components:   Glucose-Capillary 130 (*)    All other components within normal limits  MAGNESIUM   URINALYSIS, W/ REFLEX TO CULTURE (INFECTION SUSPECTED)     MDM  Given peaked T waves and abnormalities found on EKG with missed dialysis over the past 1 week will treat for hyperkalemia.  Glucose is only 86.  Ordered IV calcium , Lokelma  and 5 units of insulin  with D50 bolus.  Required ultrasound-guided IV Clinical Course as of 04/13/24 1100  Mon Apr 13, 2024  1036 Patient's lab work with significantly elevated BUN at 128 which is likely the cause of her altered mental status.  Potassium 5.9.  CO2 is 20.  Creatinine is  at her baseline.  Elevated phosphorus.  Repeat glucose 130 after insulin  and D50.  Consulted nephrology for emergent dialysis [SM]    Clinical Course User Index [SM] Punter Clotilda, MD     PROCEDURES:  Critical Care performed: yes  .Critical Care  Performed by: Punter,  Clotilda, MD Authorized by: Suzanne Clotilda, MD   Critical care provider statement:    Critical care time (minutes):  30   Critical care time was exclusive of:  Separately billable procedures and treating other patients   Critical care was necessary to treat or prevent imminent or life-threatening deterioration of the following conditions:  Renal failure and endocrine crisis   Critical care was time spent personally by me on the following activities:  Development of treatment plan with patient or surrogate, discussions with consultants, evaluation of patient's response to treatment, examination of patient, ordering and review of laboratory studies, ordering and review of radiographic studies, ordering and performing treatments and interventions, pulse oximetry, re-evaluation of patient's condition and review of old charts   Care discussed with: admitting provider   .Ultrasound ED Peripheral IV (Provider)  Date/Time: 04/13/2024 9:18 AM  Performed by: Suzanne Clotilda, MD Authorized by: Suzanne Clotilda, MD   Procedure details:    Indications: multiple failed IV attempts     Skin Prep: chlorhexidine  gluconate     Location:  Right AC   Angiocath:  18 G   Bedside Ultrasound Guided: Yes     Images: not archived     Patient tolerated procedure without complications: Yes     Dressing applied: Yes     Patient's presentation is most consistent with acute presentation with potential threat to life or bodily function.   MEDICATIONS ORDERED IN ED: Medications  calcium  gluconate 1 g/ 50 mL sodium chloride  IVPB (0 mg Intravenous Stopped 04/13/24 1045)  sodium zirconium cyclosilicate  (LOKELMA ) packet 10 g (10 g Oral Given 04/13/24  0925)  insulin  aspart (novoLOG ) injection 5 Units (5 Units Intravenous Given 04/13/24 0931)    And  dextrose  50 % solution 50 mL (50 mLs Intravenous Given 04/13/24 0923)    FINAL CLINICAL IMPRESSION(S) / ED DIAGNOSES   Final diagnoses:  Altered mental status, unspecified altered mental status type  Uremia  Hyperkalemia     Rx / DC Orders   ED Discharge Orders     None        Note:  This document was prepared using Dragon voice recognition software and may include unintentional dictation errors.   Suzanne Clotilda, MD 04/13/24 1038    Suzanne Clotilda, MD 04/13/24 1100

## 2024-04-13 NOTE — ED Notes (Signed)
 Patient transported to CT

## 2024-04-13 NOTE — ED Triage Notes (Signed)
 Pt coming via EMS from [facility] due to staff complain of pt appearing confused. PT alert to self and situation. Pt had been refusing dialysis since Nov 21st. CBG 82 for EMS. Pt uses wheelchair to move around.

## 2024-04-13 NOTE — H&P (Signed)
 History and Physical    Patient: Katie [name] FMW:[id number] DOB: 23-Sep-1955 DOA: 04/13/2024 DOS: the patient was seen and examined on 04/13/2024 PCP: Cordella Corning, FNP  Patient coming from: ALF/ILF  Chief Complaint:  Chief Complaint  Patient presents with   Altered Mental Status   HPI: Eryca Bolte is a 68 y.o. female with medical history significant of depression, anxiety, GERD, ESRD on HD, COPD, hypertension, severe aortic stenosis, recurrent syncopal episodes due to severe aortic stenosis, previously determined not to be a TAVR candidate.  Patient was recently discharged from the hospital on 11/18.  She was sent back to her [facility].  She was pursuing a second opinion via [facility] cardiology to consider TAVR. Reportedly she told in the staff members at assisted living that she was making urine and thus no longer needed hemodialysis.  Her last hemodialysis session was 11/21. Staff brought the patient to the ED today with altered mental status.    Labs on arrival consistent with needing dialysis, potassium 5.9, BUN 128, hypochloremic.  Head CT without acute intracranial abnormalities.  CXR unchanged from prior. EKG with some peaked T waves, prolonged PR interval.  Patient was given shifters and nephrology was called for dialysis.   At time my evaluation patient is receiving dialysis.  She stares blankly throughout her evaluation.  Requires significant prompting to get reply.  She does wiggle her fingers when asked but is unable to answer questions.  She does not track or make eye contact.   Review of Systems: Unable to review all systems due to lack of cooperation from patient. Past Medical History:  Diagnosis Date   Chronic diastolic heart failure (HCC)    COPD (chronic obstructive pulmonary disease) (HCC)    Diabetes mellitus without complication (HCC)    ESRD on dialysis (HCC)    Hypertension    Severe aortic stenosis    Past Surgical History:  Procedure Laterality Date    A/V SHUNT INTERVENTION Left 12/21/2021   Procedure: A/V SHUNT INTERVENTION;  Surgeon: Marea Selinda RAMAN, MD;  Location: [facility] IN[facility];  Service: Cardiovascular;  Laterality: Left;   APPENDECTOMY     AV FISTULA PLACEMENT Left 03/16/2021   Procedure: INSERTION OF ARTERIOVENOUS (AV) GORE-TEX GRAFT ARM;  Surgeon: Marea Selinda RAMAN, MD;  Location: [facility] ORS;  Service: Vascular;  Laterality: Left;   DIALYSIS/PERMA CATHETER INSERTION N/A 05/16/2020   Procedure: DIALYSIS/PERMA CATHETER INSERTION;  Surgeon: Marea Selinda RAMAN, MD;  Location: [facility];  Service: Cardiovascular;  Laterality: N/A;   DIALYSIS/PERMA CATHETER REMOVAL N/A 03/26/2022   Procedure: DIALYSIS/PERMA CATHETER REMOVAL;  Surgeon: Marea Selinda RAMAN, MD;  Location: [facility];  Service: Cardiovascular;  Laterality: N/A;   DIALYSIS/PERMA CATHETER REPAIR N/A 01/22/2022   Procedure: DIALYSIS/PERMA CATHETER INSERT;  Surgeon: Marea Selinda RAMAN, MD;  Location: [facility];  Service: Cardiovascular;  Laterality: N/A;   MANDIBLE SURGERY     RENAL BIOPSY Right    RIGHT HEART CATH N/A 05/10/2020   Procedure: RIGHT HEART CATH;  Surgeon: Mady Bruckner, MD;  Location: [facility];  Service: Cardiovascular;  Laterality: N/A;   RIGHT/LEFT HEART CATH AND CORONARY ANGIOGRAPHY Bilateral 08/15/2023   Procedure: RIGHT/LEFT HEART CATH AND CORONARY ANGIOGRAPHY;  Surgeon: Anner Alm ORN, MD;  Location: [facility];  Service: Cardiovascular;  Laterality: Bilateral;   TEMPORARY DIALYSIS CATHETER N/A 05/11/2020   Procedure: TEMPORARY DIALYSIS CATHETER;  Surgeon: Marea Selinda RAMAN, MD;  Location: [facility];  Service: Cardiovascular;  Laterality: N/A;   Social History:  reports that she has quit smoking. Her smoking use included cigarettes. She has never used smokeless tobacco. She reports that she does not currently use alcohol . She reports that she does not use drugs.  Allergies  Allergen Reactions   Penicillins Nausea And  Vomiting    Pt states only pills make her vomit Other reaction(s): Nausea/Vomit    Family History  Problem Relation Age of Onset   Heart attack Mother    Asthma Mother    Uterine cancer Mother    Skin cancer Father    Prostate cancer Father    CAD Father    Parkinson's disease Father    Lung cancer Brother     Prior to Admission medications   Medication Sig Start Date End Date Taking? Authorizing Provider  acetaminophen  (TYLENOL ) 500 MG tablet Take 500 mg by mouth 3 (three) times daily. Take additional 500 mg every 6 hours as needed for pain   Yes [provider]  albuterol  (VENTOLIN  HFA) 108 (90 Base) MCG/ACT inhaler Inhale 1 puff into the lungs every 6 (six) hours as needed for wheezing. 01/29/22  Yes [provider]  aspirin  81 MG chewable tablet Chew 81 mg by mouth daily. 01/14/24  Yes [provider]  DULoxetine  (CYMBALTA ) 30 MG capsule Take 30 mg by mouth daily. 08/06/23  Yes [provider]  famotidine  (PEPCID ) 20 MG tablet Take 10 mg by mouth at bedtime.   Yes [provider]  fluticasone -salmeterol (ADVAIR) 250-50 MCG/ACT AEPB Inhale 1 puff into the lungs 2 (two) times daily.   Yes [provider]  loperamide  (IMODIUM  A-D) 2 MG tablet Take 2 mg by mouth 4 (four) times daily as needed for diarrhea or loose stools.   Yes [provider]  melatonin 3 MG TABS tablet Take 3 mg by mouth at bedtime.   Yes [provider]  metoprolol  tartrate (LOPRESSOR ) 25 MG tablet Take 0.5 tablets (12.5 mg total) by mouth 2 (two) times daily. 03/31/24  Yes Larcenia Holaday, DO  midodrine  (PROAMATINE ) 5 MG tablet Take 5 mg by mouth. Monday, Wednesday, Friday prior to dialysis 05/06/23  Yes [provider]  MIRALAX  17 GM/SCOOP powder Take 17 g by mouth daily as needed for mild constipation. 03/09/24  Yes [provider]  ondansetron  (ZOFRAN ) 4 MG tablet Take 4 mg by mouth every Monday, Wednesday, and Friday.  04/27/23  Yes [provider]  oxyCODONE  (OXY IR/ROXICODONE ) 5 MG immediate release tablet Take 5 mg by mouth 4 (four) times daily as needed.   Yes [provider]  pantoprazole  (PROTONIX ) 40 MG tablet Take 40 mg by mouth daily. 01/14/24  Yes [provider]  pramoxine (SARNA SENSITIVE) 1 % LOTN Apply 1 Application topically 2 (two) times daily.   Yes [provider]  topiramate  (TOPAMAX ) 50 MG tablet Take 1 tablet (50 mg total) by mouth 2 (two) times daily as needed (headache). 03/21/21  Yes Sreenath, Sudheer B, MD  traZODone  (DESYREL ) 50 MG tablet Take 75 mg by mouth at bedtime. 01/14/24  Yes [provider]    Physical Exam: Vitals:   04/13/24 1159 04/13/24 1222 04/13/24 1232 04/13/24 1300  BP: (!) 126/59 (!) 133/53 (!) 126/53 (!) 140/61  Pulse: 88 90 81 85  Resp: 11 (!) 23 13 (!) 23  Temp: (!) 97.1 F (36.2 C)     TempSrc: Axillary     SpO2: 90% 97% 100% 100%  Weight: 62.6 kg  Constitutional:  Normal appearance.  Appears chronically ill HENT: Head Normocephalic and atraumatic.  Mucous membranes are moist.  Eyes:  Extraocular intact. Conjunctivae normal. Cardiovascular: Rate and Rhythm: Normal rate and regular rhythm.  Pulmonary: Non labored, symmetric rise of chest wall.  Musculoskeletal:  Normal range of motion.  Skin: warm and dry. not jaundiced.  Neurological: Alert, staring blankly, mutters nonsensically when prompted.  Intermittently following commands.  Not track consistently.  Data Reviewed:    Latest Ref Rng & Units 04/13/2024    9:10 AM 03/30/2024    5:30 AM 03/29/2024    5:06 AM  CBC  WBC 4.0 - 10.5 K/uL 6.8  5.3  5.4   Hemoglobin 12.0 - 15.0 g/dL 88.5  9.9  89.8   Hematocrit 36.0 - 46.0 % 36.3  31.5  30.9   Platelets 150 - 400 K/uL 105  117  114       Latest Ref Rng & Units 04/13/2024    9:10 AM 03/30/2024    6:53 AM 03/27/2024    7:16 AM  BMP  Glucose 70 - 99 mg/dL 71  91  888   BUN 8 - 23 mg/dL 871  75  59    Creatinine 0.44 - 1.00 mg/dL 1.46  5.11  6.20   Sodium 135 - 145 mmol/L 140  135  135   Potassium 3.5 - 5.1 mmol/L 5.9  5.0  3.9   Chloride 98 - 111 mmol/L 96  100  97   CO2 22 - 32 mmol/L 20  25  27    Calcium  8.9 - 10.3 mg/dL 9.3  8.8  9.0    CT Head Wo Contrast EXAM: CT HEAD WITHOUT CONTRAST 04/13/2024 08:55:32 AM  TECHNIQUE: CT of the head was performed without the administration of intravenous contrast. Automated exposure control, iterative reconstruction, and/or weight based adjustment of the mA/kV was utilized to reduce the radiation dose to as low as reasonably achievable.  COMPARISON: CT of the head dated 03/25/2024.  CLINICAL HISTORY: Mental status change, unknown cause.  FINDINGS:  BRAIN AND VENTRICLES: No acute hemorrhage. No evidence of acute infarct. No hydrocephalus. No extra-axial collection. No mass effect or midline shift. There is age-related atrophy and mild periventricular white matter disease. There are moderate calcifications within the carotid siphons and vertebral arteries.  ORBITS: No acute abnormality.  SINUSES: No acute abnormality.  SOFT TISSUES AND SKULL: No acute soft tissue abnormality. No skull fracture.  IMPRESSION: 1. No acute intracranial abnormality. 2. Age-related atrophy and mild periventricular white matter disease. 3. Moderate calcifications within the carotid siphons and vertebral arteries.  Electronically signed by: Evalene Coho MD 04/13/2024 09:27 AM EST RP Workstation: [id number] DG Chest Portable 1 View CLINICAL DATA:  Dyspnea.  EXAM: PORTABLE CHEST 1 VIEW  COMPARISON:  03/26/2024 and CT 03/26/2024  FINDINGS: Stable blunting at both costophrenic angles. Stable retrocardiac densities. Patchy densities in the right lung are unchanged. Heart size is upper limits of normal. Atherosclerotic calcifications at the aortic arch.  IMPRESSION: 1. No significant change since 03/26/2024. 2. Persistent left basilar  densities. Small densities in the right lung are similar to recent chest CT.  Electronically Signed   By: Juliene Balder M.D.   On: 04/13/2024 09:05    Assessment and Plan:  Metabolic encephalopathy - Presumed secondary to uremia, patient has missed dialysis for over a week when she is meant to be receiving MWF - BUN severely elevated to 128 on arrival - Expect improvement with dialysis. - Nephrology consulted.  Patient currently receiving dialysis - Otherwise infectious workup unremarkable, no leukocytosis, no fever.  UA still pending.  Head CT and CXR without acute changes - PT/OT evals - Hold duloxetine  until mental status improves - Continue to monitor closely for improvement - Delirium precautions - Frequent reorientation - Monitor on telemetry  Recurrent syncopal episodes - Previously evaluated for this.  Patient has recurrent syncopal episodes due to severe aortic stenosis - She has been advised to stay in a wheelchair and not attempt ambulation.  No reported falls prior to arrival   Severe aortic stenosis - Was declined by [facility] heart team for TAVR due to poor functional status. - She was referred to [facility] cardiology for second opinion - Continue home medications for now   History of subdural hematoma and intraparenchymal hematoma - After mechanical fall in September - Has follow-up outpatient with neurosurgery - CT head on arrival without acute intracranial abnormality   ESRD - Missed dialysis.  Last dialysis session 11/21.  Unclear why patient stopped going.  Will educate her on this when she is more alert - Nephrology consulted during this admission.  Receiving dialysis today   Type 2 diabetes - Treat sliding scale insulin , titrate as needed   Hypertension - Resume home meds   Chronic pain syndrome - Home dose oxycodone  resumed   Adjustment disorder with mixed anxiety and depressed mood - Resume home meds   Ascending thoracic aortic aneurysm - Seen on CTA.   4.1 cm.  Stable. - Needs annual outpatient imaging follow-up.     Advance Care Planning:   Code Status: Full Code has previously been a DNR on prior visits.  At this time we will continue with full code as she is too altered to have CODE STATUS discussion today  Consults: Nephrology  Family Communication: None at bedside  Severity of Illness: The appropriate patient status for this patient is OBSERVATION. Observation status is judged to be reasonable and necessary in order to provide the required intensity of service to ensure the patient's safety. The patient's presenting symptoms, physical exam findings, and initial radiographic and laboratory data in the context of their medical condition is felt to place them at decreased risk for further clinical deterioration. Furthermore, it is anticipated that the patient will be medically stable for discharge from the hospital within 2 midnights of admission.   Author: Shamarie Call, DO 04/13/2024 1:36 PM  For on call review [email or web address].

## 2024-04-13 NOTE — ED Notes (Signed)
 Called CCMD to place pt on cardiac monitor

## 2024-04-14 DIAGNOSIS — Z7189 Other specified counseling: Secondary | ICD-10-CM | POA: Diagnosis not present

## 2024-04-14 DIAGNOSIS — I4891 Unspecified atrial fibrillation: Secondary | ICD-10-CM | POA: Diagnosis present

## 2024-04-14 DIAGNOSIS — G894 Chronic pain syndrome: Secondary | ICD-10-CM | POA: Diagnosis present

## 2024-04-14 DIAGNOSIS — N186 End stage renal disease: Secondary | ICD-10-CM | POA: Diagnosis present

## 2024-04-14 DIAGNOSIS — I4901 Ventricular fibrillation: Secondary | ICD-10-CM | POA: Diagnosis present

## 2024-04-14 DIAGNOSIS — I499 Cardiac arrhythmia, unspecified: Secondary | ICD-10-CM

## 2024-04-14 DIAGNOSIS — D631 Anemia in chronic kidney disease: Secondary | ICD-10-CM | POA: Diagnosis present

## 2024-04-14 DIAGNOSIS — Z8249 Family history of ischemic heart disease and other diseases of the circulatory system: Secondary | ICD-10-CM | POA: Diagnosis not present

## 2024-04-14 DIAGNOSIS — J449 Chronic obstructive pulmonary disease, unspecified: Secondary | ICD-10-CM | POA: Diagnosis present

## 2024-04-14 DIAGNOSIS — E1122 Type 2 diabetes mellitus with diabetic chronic kidney disease: Secondary | ICD-10-CM | POA: Diagnosis present

## 2024-04-14 DIAGNOSIS — Z7951 Long term (current) use of inhaled steroids: Secondary | ICD-10-CM | POA: Diagnosis not present

## 2024-04-14 DIAGNOSIS — Z992 Dependence on renal dialysis: Secondary | ICD-10-CM | POA: Diagnosis not present

## 2024-04-14 DIAGNOSIS — G9341 Metabolic encephalopathy: Secondary | ICD-10-CM | POA: Diagnosis present

## 2024-04-14 DIAGNOSIS — I350 Nonrheumatic aortic (valve) stenosis: Secondary | ICD-10-CM | POA: Diagnosis present

## 2024-04-14 DIAGNOSIS — I5032 Chronic diastolic (congestive) heart failure: Secondary | ICD-10-CM | POA: Diagnosis present

## 2024-04-14 DIAGNOSIS — G9349 Other encephalopathy: Secondary | ICD-10-CM | POA: Diagnosis present

## 2024-04-14 DIAGNOSIS — R4182 Altered mental status, unspecified: Secondary | ICD-10-CM | POA: Diagnosis present

## 2024-04-14 DIAGNOSIS — I2489 Other forms of acute ischemic heart disease: Secondary | ICD-10-CM | POA: Diagnosis present

## 2024-04-14 DIAGNOSIS — G9340 Encephalopathy, unspecified: Secondary | ICD-10-CM | POA: Diagnosis not present

## 2024-04-14 DIAGNOSIS — N19 Unspecified kidney failure: Secondary | ICD-10-CM | POA: Diagnosis not present

## 2024-04-14 DIAGNOSIS — I132 Hypertensive heart and chronic kidney disease with heart failure and with stage 5 chronic kidney disease, or end stage renal disease: Secondary | ICD-10-CM | POA: Diagnosis present

## 2024-04-14 DIAGNOSIS — Z515 Encounter for palliative care: Secondary | ICD-10-CM | POA: Diagnosis not present

## 2024-04-14 DIAGNOSIS — I472 Ventricular tachycardia, unspecified: Secondary | ICD-10-CM | POA: Diagnosis not present

## 2024-04-14 DIAGNOSIS — E875 Hyperkalemia: Secondary | ICD-10-CM | POA: Diagnosis present

## 2024-04-14 DIAGNOSIS — I469 Cardiac arrest, cause unspecified: Secondary | ICD-10-CM | POA: Diagnosis present

## 2024-04-14 DIAGNOSIS — I7121 Aneurysm of the ascending aorta, without rupture: Secondary | ICD-10-CM | POA: Diagnosis present

## 2024-04-14 DIAGNOSIS — F32A Depression, unspecified: Secondary | ICD-10-CM | POA: Diagnosis present

## 2024-04-14 DIAGNOSIS — Z66 Do not resuscitate: Secondary | ICD-10-CM | POA: Diagnosis present

## 2024-04-14 DIAGNOSIS — Z7982 Long term (current) use of aspirin: Secondary | ICD-10-CM | POA: Diagnosis not present

## 2024-04-14 LAB — CBC WITH DIFFERENTIAL/PLATELET
Abs Immature Granulocytes: 0.03 K/uL (ref 0.00–0.07)
Basophils Absolute: 0 K/uL (ref 0.0–0.1)
Basophils Relative: 0 %
Eosinophils Absolute: 0.1 K/uL (ref 0.0–0.5)
Eosinophils Relative: 2 %
HCT: 34 % — ABNORMAL LOW (ref 36.0–46.0)
Hemoglobin: 11.1 g/dL — ABNORMAL LOW (ref 12.0–15.0)
Immature Granulocytes: 1 %
Lymphocytes Relative: 18 %
Lymphs Abs: 1.1 K/uL (ref 0.7–4.0)
MCH: 33.3 pg (ref 26.0–34.0)
MCHC: 32.6 g/dL (ref 30.0–36.0)
MCV: 102.1 fL — ABNORMAL HIGH (ref 80.0–100.0)
Monocytes Absolute: 0.9 K/uL (ref 0.1–1.0)
Monocytes Relative: 15 %
Neutro Abs: 3.9 K/uL (ref 1.7–7.7)
Neutrophils Relative %: 64 %
Platelets: 87 K/uL — ABNORMAL LOW (ref 150–400)
RBC: 3.33 MIL/uL — ABNORMAL LOW (ref 3.87–5.11)
RDW: 12.7 % (ref 11.5–15.5)
WBC: 6.1 K/uL (ref 4.0–10.5)
nRBC: 0 % (ref 0.0–0.2)

## 2024-04-14 LAB — COMPREHENSIVE METABOLIC PANEL WITH GFR
ALT: 135 U/L — ABNORMAL HIGH (ref 0–44)
AST: 80 U/L — ABNORMAL HIGH (ref 15–41)
Albumin: 3 g/dL — ABNORMAL LOW (ref 3.5–5.0)
Alkaline Phosphatase: 102 U/L (ref 38–126)
Anion gap: 17 — ABNORMAL HIGH (ref 5–15)
BUN: 59 mg/dL — ABNORMAL HIGH (ref 8–23)
CO2: 24 mmol/L (ref 22–32)
Calcium: 9.1 mg/dL (ref 8.9–10.3)
Chloride: 96 mmol/L — ABNORMAL LOW (ref 98–111)
Creatinine, Ser: 5.09 mg/dL — ABNORMAL HIGH (ref 0.44–1.00)
GFR, Estimated: 9 mL/min — ABNORMAL LOW (ref >60)
Glucose, Bld: 76 mg/dL (ref 70–99)
Potassium: 4.1 mmol/L (ref 3.5–5.1)
Sodium: 136 mmol/L (ref 135–145)
Total Bilirubin: 0.8 mg/dL (ref 0.0–1.2)
Total Protein: 6.1 g/dL — ABNORMAL LOW (ref 6.5–8.1)

## 2024-04-14 LAB — PHOSPHORUS: Phosphorus: 5.3 mg/dL — ABNORMAL HIGH (ref 2.5–4.6)

## 2024-04-14 LAB — MAGNESIUM: Magnesium: 2.1 mg/dL (ref 1.7–2.4)

## 2024-04-14 MED ORDER — LORAZEPAM 2 MG/ML PO CONC: 1.0000 mg | ORAL | Status: DC | PRN

## 2024-04-14 MED ORDER — RISPERIDONE 1 MG PO TBDP
0.5000 mg | ORAL_TABLET | Freq: Two times a day (BID) | ORAL | Status: DC
  Filled 2024-04-14 (×4): qty 0.5

## 2024-04-14 MED ORDER — ALBUTEROL SULFATE (2.5 MG/3ML) 0.083% IN NEBU: 2.5000 mg | INHALATION_SOLUTION | RESPIRATORY_TRACT | Status: DC | PRN

## 2024-04-14 MED ORDER — ALUM & MAG HYDROXIDE-SIMETH 200-200-20 MG/5ML PO SUSP: 30.0000 mL | Freq: Four times a day (QID) | ORAL | Status: DC | PRN

## 2024-04-14 MED ORDER — GLYCOPYRROLATE 0.2 MG/ML IJ SOLN: 0.2000 mg | INTRAMUSCULAR | Status: DC | PRN

## 2024-04-14 MED ORDER — GLYCOPYRROLATE 0.2 MG/ML IJ SOLN: 0.1000 mg | Freq: Three times a day (TID) | INTRAMUSCULAR | Status: DC

## 2024-04-14 MED ORDER — MORPHINE SULFATE (CONCENTRATE) 10 MG /0.5 ML PO SOLN: 5.0000 mg | ORAL | Status: DC | PRN

## 2024-04-14 MED ORDER — OXYCODONE HCL 20 MG/ML PO CONC: 5.0000 mg | ORAL | Status: DC | PRN

## 2024-04-14 MED ORDER — AMIODARONE IV BOLUS ONLY 150 MG/100ML
150.0000 mg | Freq: Once | INTRAVENOUS | Status: AC
  Administered 2024-04-14: 150 mg via INTRAVENOUS
  Filled 2024-04-14: qty 100

## 2024-04-14 MED ORDER — LORAZEPAM 2 MG/ML IJ SOLN
1.0000 mg | INTRAMUSCULAR | Status: DC | PRN
  Administered 2024-04-17: 1 mg via INTRAVENOUS
  Filled 2024-04-14: qty 1

## 2024-04-14 MED ORDER — LORAZEPAM 1 MG PO TABS: 1.0000 mg | ORAL_TABLET | ORAL | Status: DC | PRN

## 2024-04-14 MED ORDER — GLYCOPYRROLATE 0.2 MG/ML IJ SOLN
0.2000 mg | INTRAMUSCULAR | Status: DC | PRN
  Administered 2024-04-14 – 2024-04-17 (×3): 0.2 mg via INTRAVENOUS
  Filled 2024-04-14 (×3): qty 1

## 2024-04-14 MED ORDER — METOPROLOL TARTRATE 5 MG/5ML IV SOLN
5.0000 mg | Freq: Once | INTRAVENOUS | Status: AC
  Administered 2024-04-14: 5 mg via INTRAVENOUS
  Filled 2024-04-14: qty 5

## 2024-04-14 MED ORDER — GLYCOPYRROLATE 1 MG PO TABS: 1.0000 mg | ORAL_TABLET | ORAL | Status: DC | PRN

## 2024-04-14 MED ORDER — HYDROMORPHONE HCL 1 MG/ML IJ SOLN
0.5000 mg | INTRAMUSCULAR | Status: DC | PRN
  Administered 2024-04-14 – 2024-04-16 (×4): 1 mg via INTRAVENOUS
  Filled 2024-04-14 (×5): qty 1

## 2024-04-14 MED ORDER — POLYVINYL ALCOHOL 1.4 % OP SOLN: 1.0000 [drp] | Freq: Four times a day (QID) | OPHTHALMIC | Status: DC | PRN

## 2024-04-14 MED ORDER — COLLAGENASE 250 UNIT/GM EX OINT
TOPICAL_OINTMENT | Freq: Every day | CUTANEOUS | Status: DC
  Filled 2024-04-14: qty 30

## 2024-04-14 MED ORDER — MORPHINE SULFATE (PF) 2 MG/ML IV SOLN
2.0000 mg | INTRAVENOUS | Status: DC | PRN
  Administered 2024-04-14: 2 mg via INTRAVENOUS
  Filled 2024-04-14: qty 1

## 2024-04-14 NOTE — Consult Note (Signed)
 WOC Nurse Consult Note: WOC consult performed remotely utilizing imaging and chart review  Reason for Consult: multiple wounds  Wound type: R/L top of foot full thickness wounds, 100% covered with dry scab L 2nd and 3rd toes: partial thickness wounds 100% pink to 2nd toe, 100% covered with dry scab to 3rd toe R great toe and 2nd toe full thickness wound; 100% covered with dry scab, mild erythema in the periwound R/L achilles unstageable wound, 100% covered with dry eschar, intact periwound Unstageable pressure injury to sacrum; 80% covered with black eschar, 20 % pink/red with skin sloughing and deep purple maroon discoloration in the periwound area LLE full thickness wound with 100% black eschar, intact periwound R lateral elbow skin tear: 100% dry red wound bed with intact periwound Pressure Injury POA: Yes Measurement: see nursing flow sheets Drainage (amount, consistency, odor) see nursing flow sheets Dressing procedure/placement/frequency:  1.R/L top of foot, L3rd toe, Rgreat toe/ 2nd toe, R/L achilles: paint with betadine and leave open to air 2. L 2nd toe: leave open to air 3. Sacrum: cleanse with vashe (lawson # [id number]) allow to air dry.  Apply 1/4 thick layer of Santyl to wound bed, top with saline moist gauze. Top with silicone foam.  Ok to lift foam daily for change of gauze and reapplication of Santyl. Ok to change silicone foam every 3 days.   4. LLE/ R lateral elbow: cleanse with Vashe (lawson # [id number]) allow to air dry.  Place Xeroform over wound bed and cover with silicone foam dressing. Change daily.                 WOC team will not follow patient at this time, please re consult if new needs arise or wound deteriorates.  Thank you,  Doyal Polite, MSN, RN, [facility] WOC Team [phone] (Available Mon-Fri 0700-1500)

## 2024-04-14 NOTE — Plan of Care (Signed)

## 2024-04-14 NOTE — Progress Notes (Signed)
 PT Cancellation Note  Patient Details Name: Jaydyn Menon MRN: [id number] DOB: [birth year]   Cancelled Treatment:    Reason Eval/Treat Not Completed: Medical issues which prohibited therapy (Consult received and chart reviewed.  Upon arrival to unit, patient involved with rapid response episode; now transitioned to comfort care. Orders completed.  Please reconsult should needs, goals of care change.)   Baileigh Modisette H. Delores, PT, DPT, NCS 04/14/24, 1:42 PM [phone]

## 2024-04-14 NOTE — Progress Notes (Addendum)
 RAPID RESPONSE  Rapid response called for rapid rhythm.  Requested EKG.  On arrival to bedside EKG consistent with A-fib RVR, rate in the 160s.  Patient was given 5 mg IV metoprolol  with minimal response.  Monitoring on telemetry and heart rate then appeared to switch into a ventricular rhythm/V-fib.  Proceeded with 150 Amio bolus.  Rapid response was called.  Patient was disoriented, and began to experience agonal breathing.  Blood pressure very low with MAP around the 30s and heart rate slowed significantly.  1 L NS bolus started.  I called urgently to discuss her care with sister, [name].  Dawn desires her sister to be out of pain above all else.  She supports transition to comfort measures only.  Gradually over 1 hour patient's pulse and heart rate began to pick up.  At the time of this documentation patient now is maintaining a MAP above 60 with heart rate in the 90s.  She is increasingly becoming more alert.  She remains too disoriented to participate in significant discussion regarding goals of care but upon sternal rubbing does yell back.  I again spoke with her sister, [name], on the phone.  We discussed that given her severe aortic stenosis, ESRD, poor functional status, and poor candidacy for TAVR she is likely to return to ventricular arrhythmias again.  I discussed with Dawn, that these rhythms are typically fatal.  I also discussed Taysia's previous desires to be DNR and DNI.  I discussed with Dawn, that in light of her significant comorbidities further aggressive care is not only futile but is likely to provide patient harm and discomfort.  Dawn agrees with all of the above and reinforces her desire to make [place] comfort measures only.  Comfort measures orders have been placed.  I have communicated this to change in care plan directly to her bedside RN.  We will turn off telemetry, proceed with qshift only vitals, continue all lab draws.  Will continue to monitor closely for her pain and  agitation needs.   CRITICAL CARE Performed by: Lorane Poland   Total critical care time: 61 minutes  Critical care time was exclusive of separately billable procedures and treating other patients.  Critical care was necessary to treat or prevent imminent or life-threatening deterioration.  Critical care was time spent personally by me on the following activities: development of treatment plan with patient and/or surrogate as well as nursing, discussions with consultants, evaluation of patient's response to treatment, examination of patient, obtaining history from patient or surrogate, ordering and performing treatments and interventions, ordering and review of laboratory studies, ordering and review of radiographic studies, pulse oximetry and re-evaluation of patient's condition.

## 2024-04-14 NOTE — Evaluation (Signed)
 Clinical/Bedside Swallow Evaluation Patient Details  Name: Katie [name] MRN: [id number] Date of Birth: 1955-09-18  Today's Date: 04/14/2024 Time: SLP Start Time (ACUTE ONLY): 0818 SLP Stop Time (ACUTE ONLY): 0832 SLP Time Calculation (min) (ACUTE ONLY): 14 min  Past Medical History:  Past Medical History:  Diagnosis Date   Chronic diastolic heart failure (HCC)    COPD (chronic obstructive pulmonary disease) (HCC)    Diabetes mellitus without complication (HCC)    ESRD on dialysis (HCC)    Hypertension    Severe aortic stenosis    Past Surgical History:  Past Surgical History:  Procedure Laterality Date   A/V SHUNT INTERVENTION Left 12/21/2021   Procedure: A/V SHUNT INTERVENTION;  Surgeon: Marea Selinda RAMAN, MD;  Location: [facility];  Service: Cardiovascular;  Laterality: Left;   APPENDECTOMY     AV FISTULA PLACEMENT Left 03/16/2021   Procedure: INSERTION OF ARTERIOVENOUS (AV) GORE-TEX GRAFT ARM;  Surgeon: Marea Selinda RAMAN, MD;  Location: [facility];  Service: Vascular;  Laterality: Left;   DIALYSIS/PERMA CATHETER INSERTION N/A 05/16/2020   Procedure: DIALYSIS/PERMA CATHETER INSERTION;  Surgeon: Marea Selinda RAMAN, MD;  Location: [facility];  Service: Cardiovascular;  Laterality: N/A;   DIALYSIS/PERMA CATHETER REMOVAL N/A 03/26/2022   Procedure: DIALYSIS/PERMA CATHETER REMOVAL;  Surgeon: Marea Selinda RAMAN, MD;  Location: [facility];  Service: Cardiovascular;  Laterality: N/A;   DIALYSIS/PERMA CATHETER REPAIR N/A 01/22/2022   Procedure: DIALYSIS/PERMA CATHETER INSERT;  Surgeon: Marea Selinda RAMAN, MD;  Location: [facility];  Service: Cardiovascular;  Laterality: N/A;   MANDIBLE SURGERY     RENAL BIOPSY Right    RIGHT HEART CATH N/A 05/10/2020   Procedure: RIGHT HEART CATH;  Surgeon: Mady Bruckner, MD;  Location: [facility];  Service: Cardiovascular;  Laterality: N/A;   RIGHT/LEFT HEART CATH AND CORONARY ANGIOGRAPHY Bilateral 08/15/2023   Procedure: RIGHT/LEFT  HEART CATH AND CORONARY ANGIOGRAPHY;  Surgeon: Anner Alm ORN, MD;  Location: [facility];  Service: Cardiovascular;  Laterality: Bilateral;   TEMPORARY DIALYSIS CATHETER N/A 05/11/2020   Procedure: TEMPORARY DIALYSIS CATHETER;  Surgeon: Marea Selinda RAMAN, MD;  Location: [facility];  Service: Cardiovascular;  Laterality: N/A;   HPI:  Per H&P, Katie [name] is a 68 y.o. female with medical history significant of depression, anxiety, GERD, ESRD on HD, COPD, hypertension, severe aortic stenosis, recurrent syncopal episodes due to severe aortic stenosis, previously determined not to be a TAVR candidate.  Patient was recently discharged from the hospital on 11/18.  She was sent back to her [place] house.  She was pursuing a second opinion via [facility] to consider TAVR.  Reportedly she told in the staff members at assisted living that she was making urine and thus no longer needed hemodialysis.  Her last hemodialysis session was 11/21. CXR 1. No significant change since 03/26/2024.  2. Persistent left basilar densities. Small densities in the right  lung are similar to recent chest CT.  Head CT, 1. No acute intracranial abnormality.  2. Age-related atrophy and mild periventricular white matter disease.  3. Moderate calcifications within the carotid siphons and vertebral arteries.    Assessment / Plan / Recommendation  Clinical Impression  Pt seen for clinical swallowing evaluation. Pt alert, confused, easily distracted, and inconsistently following commands. Initially refusing to wear [place]. Pt with limited cooperation in clinical swallowing evaluation resulting in abbreviated assessment. Pt presents with s/sx concerning for pharyngeal dysphagia including delayed/wet cough with thin liquids via straw.  No overt s/sx with cup sips of thin liquids or x1 teaspoon of applesauce. Despite encouragement/education, pt declined solid trials. Suspect pt with some degree of oral dysphagia given  edentulism. Recommend cautious initiation of a puree diet with thin liquids with safe swallowing strategies/aspiration precautions including, but not limited to, 1:1 assistance with feeding and avoidance of straws. SLP to f/u per POC for diet tolerance and trials of advanced solids. SLP Visit Diagnosis: Dysphagia, oropharyngeal phase (R13.12)    Aspiration Risk  Mild aspiration risk;Moderate aspiration risk    Diet Recommendation Dysphagia 1 (Puree);Thin liquid    Liquid Administration via: No straw Medication Administration: Crushed with puree (as able) Supervision: Staff to assist with self feeding;Full supervision/cueing for compensatory strategies Compensations: Minimize environmental distractions;Slow rate;Small sips/bites Postural Changes: Seated upright at 90 degrees;Remain upright for at least 30 minutes after po intake    Other  Recommendations Oral Care Recommendations: Oral care QID;Staff/trained caregiver to provide oral care        Functional Status Assessment Patient has had a recent decline in their functional status and demonstrates the ability to make significant improvements in function in a reasonable and predictable amount of time.  Frequency and Duration min 2x/week  2 weeks       Prognosis Prognosis for improved oropharyngeal function: Fair Barriers to Reach Goals: Cognitive deficits;Behavior      Swallow Study   General Date of Onset: 04/13/24 HPI: Per H&P, Katie [name] is a 68 y.o. female with medical history significant of depression, anxiety, GERD, ESRD on HD, COPD, hypertension, severe aortic stenosis, recurrent syncopal episodes due to severe aortic stenosis, previously determined not to be a TAVR candidate.  Patient was recently discharged from the hospital on 11/18.  She was sent back to her [place] house.  She was pursuing a second opinion via [facility] to consider TAVR.  Reportedly she told in the staff members at assisted living that she was  making urine and thus no longer needed hemodialysis.  Her last hemodialysis session was 11/21. CXR 1. No significant change since 03/26/2024.  2. Persistent left basilar densities. Small densities in the right  lung are similar to recent chest CT.  Head CT, 1. No acute intracranial abnormality.  2. Age-related atrophy and mild periventricular white matter disease.  3. Moderate calcifications within the carotid siphons and vertebral arteries. Type of Study: Bedside Swallow Evaluation Previous Swallow Assessment: MBSS June 2023 recommended regular, thin Diet Prior to this Study: NPO Temperature Spikes Noted: No (WBC WNL) Respiratory Status: Room air;Nasal cannula (pt refusing [place] initially; donned at end of session) History of Recent Intubation: No Behavior/Cognition: Alert;Distractible;Requires cueing;Confused Oral Cavity Assessment: Within Functional Limits Oral Care Completed by SLP: Recent completion by staff Oral Cavity - Dentition: Edentulous Vision:  (pt did not attempt) Self-Feeding Abilities:  (pt did not attempt) Patient Positioning: Upright in bed Baseline Vocal Quality: Normal Volitional Cough: Congested Volitional Swallow: Unable to elicit    Oral/Motor/Sensory Function Overall Oral Motor/Sensory Function:  (unable to assess due to AMS)   Ice Chips Ice chips: Not tested   Thin Liquid Thin Liquid: Impaired Presentation: Cup;Straw Oral Phase Impairments:  (WFL) Pharyngeal  Phase Impairments:  (delayed, wet/congested cough with straw)    Nectar Thick Nectar Thick Liquid: Not tested   Honey Thick Honey Thick Liquid: Not tested   Puree Puree: Within functional limits   Solid     Solid: Not tested Other Comments: pt refused     Delon Bangs, M.S., CCC-SLP Speech-Language Pathologist  [facility] [phone] (ASCOM)  Delon HERO Musab Wingard 04/14/2024,8:51 AM

## 2024-04-14 NOTE — Progress Notes (Signed)
 CCMD called RN to inform nurse that patient heart rate SVT 160's MD notified at 1200 stat order for EKG. EKG showed AFIB RVR. Dr. Jearldine at bedside lopressor  5mg  given, ICU nurse called to bedside Jenelle RN) and Dr. Jhonny. amiodarone and NS bolus given. Dr. Jearldine to call family to discuss comfort care. See flowsheet for VS. See Rapid response note

## 2024-04-14 NOTE — Progress Notes (Addendum)
 [facility]  ROUNDING NOTE   Subjective:   Katie [name]  is a 68 y.o.  female with past medical history of ESRD with hemodialysis, diabetes mellitus, COPD, and hypertension. Patient presents to ED for confusion. She is admitted for Hyperkalemia [E87.5] Uremia [N19] Encephalopathy [G93.40] Altered mental status, unspecified altered mental status type [R41.82]  Patient is known to our practice and receives outpatient dialysis treatments at [facility] on a MWF schedule.   Patient seen sitting up in bed More alert today 3L [place] Denies pain Left arm edema   Objective:  Vital signs in last 24 hours:  Temp:  [97.1 F (36.2 C)-98.2 F (36.8 C)] 98.1 F (36.7 C) (12/02 0750) Pulse Rate:  [81-97] 97 (12/02 0750) Resp:  [11-32] 17 (12/02 0750) BP: (117-144)/(37-61) 126/53 (12/02 0750) SpO2:  [90 %-100 %] 100 % (12/02 0750) Weight:  [61 kg-62.6 kg] 61 kg (12/02 0100)  Weight change:  Filed Weights   04/13/24 1159 04/13/24 1622 04/14/24 0100  Weight: 62.6 kg 61 kg 61 kg    Intake/Output: I/O last 3 completed shifts: In: -  Out: 2000 [Other:2000]   Intake/Output this shift:  No intake/output data recorded.  Physical Exam: General: Critically ill appearing  Head: Normocephalic, atraumatic.   Eyes: Anicteric  Lungs:  Wheeze  Heart: Regular rate and rhythm  Abdomen:  Soft, nontender  Extremities:  No peripheral edema. Lt arm edema  Neurologic: Awake and alert  Skin: Warm,dry,   Access: Lt upper AVF    Basic Metabolic Panel: Recent Labs  Lab 04/13/24 0910 04/14/24 0450  NA 140 136  K 5.9* 4.1  CL 96* 96*  CO2 20* 24  GLUCOSE 71 76  BUN 128* 59*  CREATININE 8.53* 5.09*  CALCIUM  9.3 9.1  MG 2.4 2.1  PHOS 9.1* 5.3*    Liver Function Tests: Recent Labs  Lab 04/14/24 0450  AST 80*  ALT 135*  ALKPHOS 102  BILITOT 0.8  PROT 6.1*  ALBUMIN  3.0*    No results for input(s): LIPASE, AMYLASE in the last 168 hours. No results for  input(s): AMMONIA in the last 168 hours.  CBC: Recent Labs  Lab 04/13/24 0910 04/14/24 0450  WBC 6.8 6.1  NEUTROABS  --  3.9  HGB 11.4* 11.1*  HCT 36.3 34.0*  MCV 105.2* 102.1*  PLT 105* 87*    Cardiac Enzymes: No results for input(s): CKTOTAL, CKMB, CKMBINDEX, TROPONINI in the last 168 hours.  BNP: Invalid input(s): POCBNP  CBG: Recent Labs  Lab 04/13/24 0913 04/13/24 1026 04/13/24 2104 04/13/24 2311  GLUCAP 67* 130* 62* 105*    Microbiology: Results for orders placed or performed during the hospital encounter of 03/25/24  Resp panel by RT-PCR (RSV, Flu A&B, Covid) Anterior Nasal Swab     Status: None   Collection Time: 03/25/24  6:53 PM   Specimen: Anterior Nasal Swab  Result Value Ref Range Status   SARS Coronavirus 2 by RT PCR NEGATIVE NEGATIVE Final    Comment: (NOTE) SARS-CoV-2 target nucleic acids are NOT DETECTED.  The SARS-CoV-2 RNA is generally detectable in upper respiratory specimens during the acute phase of infection. The lowest concentration of SARS-CoV-2 viral copies this assay can detect is 138 copies/mL. A negative result does not preclude SARS-Cov-2 infection and should not be used as the sole basis for treatment or other patient management decisions. A negative result may occur with  improper specimen collection/handling, submission of specimen other than nasopharyngeal swab, presence of viral mutation(s) within the  areas targeted by this assay, and inadequate number of viral copies(<138 copies/mL). A negative result must be combined with clinical observations, patient history, and epidemiological information. The expected result is Negative.  Fact Sheet for Patients:  bloggercourse.com  Fact Sheet for Healthcare Providers:  seriousbroker.it  This test is no t yet approved or cleared by the [place]  FDA and  has been authorized for detection and/or diagnosis of SARS-CoV-2  by FDA under an Emergency Use Authorization (EUA). This EUA will remain  in effect (meaning this test can be used) for the duration of the COVID-19 declaration under Section 564(b)(1) of the Act, 21 U.S.C.section 360bbb-3(b)(1), unless the authorization is terminated  or revoked sooner.       Influenza A by PCR NEGATIVE NEGATIVE Final   Influenza B by PCR NEGATIVE NEGATIVE Final    Comment: (NOTE) The Xpert Xpress SARS-CoV-2/FLU/RSV plus assay is intended as an aid in the diagnosis of influenza from Nasopharyngeal swab specimens and should not be used as a sole basis for treatment. Nasal washings and aspirates are unacceptable for Xpert Xpress SARS-CoV-2/FLU/RSV testing.  Fact Sheet for Patients: bloggercourse.com  Fact Sheet for Healthcare Providers: seriousbroker.it  This test is not yet approved or cleared by the [place]  FDA and has been authorized for detection and/or diagnosis of SARS-CoV-2 by FDA under an Emergency Use Authorization (EUA). This EUA will remain in effect (meaning this test can be used) for the duration of the COVID-19 declaration under Section 564(b)(1) of the Act, 21 U.S.C. section 360bbb-3(b)(1), unless the authorization is terminated or revoked.     Resp Syncytial Virus by PCR NEGATIVE NEGATIVE Final    Comment: (NOTE) Fact Sheet for Patients: bloggercourse.com  Fact Sheet for Healthcare Providers: seriousbroker.it  This test is not yet approved or cleared by the [place]  FDA and has been authorized for detection and/or diagnosis of SARS-CoV-2 by FDA under an Emergency Use Authorization (EUA). This EUA will remain in effect (meaning this test can be used) for the duration of the COVID-19 declaration under Section 564(b)(1) of the Act, 21 U.S.C. section 360bbb-3(b)(1), unless the authorization is terminated or revoked.  Performed at  [facility], [address] [place], [place] [address]     Coagulation Studies: No results for input(s): LABPROT, INR in the last 72 hours.   Urinalysis: No results for input(s): COLORURINE, LABSPEC, PHURINE, GLUCOSEU, HGBUR, BILIRUBINUR, KETONESUR, PROTEINUR, UROBILINOGEN, NITRITE, LEUKOCYTESUR in the last 72 hours.  Invalid input(s): APPERANCEUR    Imaging: CT Head Wo Contrast Result Date: 04/13/2024 EXAM: CT HEAD WITHOUT CONTRAST 04/13/2024 08:55:32 AM TECHNIQUE: CT of the head was performed without the administration of intravenous contrast. Automated exposure control, iterative reconstruction, and/or weight based adjustment of the mA/kV was utilized to reduce the radiation dose to as low as reasonably achievable. COMPARISON: CT of the head dated 03/25/2024. CLINICAL HISTORY: Mental status change, unknown cause. FINDINGS: BRAIN AND VENTRICLES: No acute hemorrhage. No evidence of acute infarct. No hydrocephalus. No extra-axial collection. No mass effect or midline shift. There is age-related atrophy and mild periventricular white matter disease. There are moderate calcifications within the carotid siphons and vertebral arteries. ORBITS: No acute abnormality. SINUSES: No acute abnormality. SOFT TISSUES AND SKULL: No acute soft tissue abnormality. No skull fracture. IMPRESSION: 1. No acute intracranial abnormality. 2. Age-related atrophy and mild periventricular white matter disease. 3. Moderate calcifications within the carotid siphons and vertebral arteries. Electronically signed by: Evalene Coho MD 04/13/2024 09:27 AM EST RP Workstation: [id number]  Chest Portable 1 View Result Date: 04/13/2024 CLINICAL DATA:  Dyspnea. EXAM: PORTABLE CHEST 1 VIEW COMPARISON:  03/26/2024 and CT 03/26/2024 FINDINGS: Stable blunting at both costophrenic angles. Stable retrocardiac densities. Patchy densities in the right lung are unchanged. Heart size is upper  limits of normal. Atherosclerotic calcifications at the aortic arch. IMPRESSION: 1. No significant change since 03/26/2024. 2. Persistent left basilar densities. Small densities in the right lung are similar to recent chest CT. Electronically Signed   By: Juliene Balder M.D.   On: 04/13/2024 09:05      Medications:     aspirin   81 mg Oral Daily   Chlorhexidine  Gluconate Cloth  6 each Topical Q0600   heparin   5,000 Units Subcutaneous Q8H   metoprolol  tartrate  12.5 mg Oral BID   [START ON 04/15/2024] midodrine   5 mg Oral Q M,W,F   pantoprazole   40 mg Oral Daily   traZODone   75 mg Oral QHS   hydrALAZINE , labetalol , Muscle Rub, oxyCODONE , sodium chloride , topiramate   Assessment/ Plan:  Ms. Anah Billard is a 68 y.o.  female with past medical history of ESRD with hemodialysis, diabetes mellitus, COPD, and hypertension. Patient presents to ED with confusion. She is admitted for Hyperkalemia [E87.5] Uremia [N19] Encephalopathy [G93.40] Altered mental status, unspecified altered mental status type [R41.82]  [facility] DVA /MWF/Lt AVF   Hyperkalemia with end stage renal disease on hemodialysis. Last treatment received on Nov 21. Potassium 4.1, corrected with dialysis. Received treatment yesterday, UF 2L achieved. Will receive dialysis today for additional cleaning to clear neuro toxins. Vascular consulted to evaluate access arm for possible stenosis.   2. Anemia of chronic kidney disease Lab Results  Component Value Date   HGB 11.1 (L) 04/14/2024    Hgb 11.1, will continue to monitor  3. Secondary Hyperparathyroidism: with outpatient labs: PTH 304, phosphorus 4.9, calcium  8.3 on 03/23/24.   Lab Results  Component Value Date   PTH 70 (H) 05/12/2020   CALCIUM  9.1 04/14/2024   CAION 1.13 (L) 08/15/2023   PHOS 5.3 (H) 04/14/2024    Calcium  and phos have improved.  4. 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 Most recent  hemoglobin A1c is 4.5 on 01/14/24.   Primary team will continue to manage      LOS: 0 Dolphus Linch 12/2/202511:55 AM

## 2024-04-14 NOTE — Progress Notes (Signed)
 PROGRESS NOTE    Katie [name]  FMW:[id number] DOB: August 24, 1955 DOA: 04/13/2024 PCP: Cordella Corning, FNP  Chief Complaint  Patient presents with   Altered Mental Status    Hospital Course:  Katie [name] is a 68 y.o. female with medical history significant of depression, anxiety, GERD, ESRD on HD, COPD, hypertension, severe aortic stenosis, recurrent syncopal episodes due to severe aortic stenosis, previously determined not to be a TAVR candidate.  Patient was recently discharged from the hospital on 11/18.  She was sent back to her [place] house.  She was pursuing a second opinion via [facility]ology to consider TAVR. Reportedly she told in the staff members at assisted living that she was making urine and thus no longer needed hemodialysis.  Her last hemodialysis session was 11/21. Staff brought the patient to the ED on 12/1 with altered mental status.  Labs on arrival consistent with needing dialysis, potassium 5.9, BUN 128, hypochloremic.  Head CT without acute intracranial abnormalities.  CXR unchanged from prior. EKG with some peaked T waves, prolonged PR interval. She received urgent dialysis on 12/1.  Subjective: On initial evaluation 12/2 patient had some improvement.  Rapid response called later in the day for A-fib RVR, which later turned into ventricular arrhythmia. She subsequently became very bradycardic and hypotensive with MAP in the 30s, she began to have agonal breathing.   Patient was given bolus of amiodarone and NS bolus. I was able to reach her sister on the phone who agreed with transition to comfort measures only.  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  After about an hour patient's pulse and pressure began to rise again.  Throughout the day she has had ongoing improvement. I had another conversation with her sister, Katie [name], and her sister would like for her to continue being comfort measures only. By reevaluation in the evening of 12/2 patient  remains very disoriented but is more alert.  She has significant upper airway rattles and is complaining of feeling unwell though she cannot identify her symptoms   Objective: Vitals:   04/14/24 1247 04/14/24 1249 04/14/24 1256 04/14/24 1611  BP: (!) 117/49 (!) 111/52 (!) 105/50 (!) 135/43  Pulse: 91   86  Resp:    16  Temp:    97.8 F (36.6 C)  TempSrc:    Oral  SpO2: (!) 74%   98%  Weight:      Height:       No intake or output data in the 24 hours ending 04/14/24 1819 Filed Weights   04/13/24 1159 04/13/24 1622 04/14/24 0100  Weight: 62.6 kg 61 kg 61 kg    Examination: General exam: Appears calm and comfortable, appears chronically ill, frail. Respiratory system: Crackles and rales heard without stethoscope.  Significant upper airway sounds. Cardiovascular system: S1 & S2 heard, RRR.  Gastrointestinal system: Abdomen is nondistended, soft and nontender.  Neuro: Alert, disoriented.  Able to report she is in the hospital, confused about all else.  Continuously repeating the same phrases  Assessment & Plan:  Principal Problem:   Encephalopathy    Comfort measures only - Patient suffers from chronic pain, severe aortic stenosis not amenable to TAVR, ESRD, diabetes, recurrent admissions, and a recent subdural hematoma after fall.  During this admission she required amiodarone for ventricular arrhythmia.  After further conversations with her daughter Katie [name], we agree that further aggressive treatment may be futile in light of her severe AS and ESRD status.  Dawn has decided to  proceed with comfort measures only. - Will discontinue all medications that do not immediately provide the patient comfort - Turn off alarms - qshift vitals - Scheduled glycopyrrolate  for upper airway secretions.  Metabolic encephalopathy - Presumed secondary to uremia, patient has missed dialysis for over a week when she is meant to be receiving MWF - BUN severely elevated to 128 on arrival - Had some  improvement in her mentation today before rapid. - Will discontinue further dialysis in light of CMO   Recurrent syncopal episodes - Previously evaluated for this.  Patient has recurrent syncopal episodes due to severe aortic stenosis - She has been advised to stay in a wheelchair and not attempt ambulation.  No reported falls prior to arrival   Severe aortic stenosis - Was declined by [facility] heart team for TAVR due to poor functional status. - She was referred to [facility] cardiology for second opinion   History of subdural hematoma and intraparenchymal hematoma - After mechanical fall in September - Had follow-up outpatient with neurosurgery - CT head on arrival without acute intracranial abnormality   ESRD - Missed dialysis.  Last dialysis session 11/21.  Unclear why patient stopped going. - Received urgent dialysis on 12/1.  No further HD given CMO   Type 2 diabetes - pleasure feeds. Stop CBG   Hypertension - Resume home meds   Chronic pain syndrome - Home dose oxycodone  resumed   Adjustment disorder with mixed anxiety and depressed mood - Resume home meds   Ascending thoracic aortic aneurysm - Seen on CTA.  4.1 cm.  Stable. - Needs annual outpatient imaging follow-up.   DVT prophylaxis: n/a   Code Status: Do not attempt resuscitation (DNR) - Comfort care Disposition:  Comfort Measures  Consultants:    Procedures:    Antimicrobials:  Anti-infectives (From admission, onward)    None       Data Reviewed: I have personally reviewed following labs and imaging studies CBC: Recent Labs  Lab 04/13/24 0910 04/14/24 0450  WBC 6.8 6.1  NEUTROABS  --  3.9  HGB 11.4* 11.1*  HCT 36.3 34.0*  MCV 105.2* 102.1*  PLT 105* 87*   Basic Metabolic Panel: Recent Labs  Lab 04/13/24 0910 04/14/24 0450  NA 140 136  K 5.9* 4.1  CL 96* 96*  CO2 20* 24  GLUCOSE 71 76  BUN 128* 59*  CREATININE 8.53* 5.09*  CALCIUM  9.3 9.1  MG 2.4 2.1  PHOS 9.1* 5.3*    GFR: Estimated Creatinine Clearance: 9.1 mL/min (A) (by C-G formula based on SCr of 5.09 mg/dL (H)). Liver Function Tests: Recent Labs  Lab 04/14/24 0450  AST 80*  ALT 135*  ALKPHOS 102  BILITOT 0.8  PROT 6.1*  ALBUMIN  3.0*   CBG: Recent Labs  Lab 04/13/24 0913 04/13/24 1026 04/13/24 2104 04/13/24 2311  GLUCAP 67* 130* 62* 105*    No results found for this or any previous visit (from the past 240 hours).   Radiology Studies: CT Head Wo Contrast Result Date: 04/13/2024 EXAM: CT HEAD WITHOUT CONTRAST 04/13/2024 08:55:32 AM TECHNIQUE: CT of the head was performed without the administration of intravenous contrast. Automated exposure control, iterative reconstruction, and/or weight based adjustment of the mA/kV was utilized to reduce the radiation dose to as low as reasonably achievable. COMPARISON: CT of the head dated 03/25/2024. CLINICAL HISTORY: Mental status change, unknown cause. FINDINGS: BRAIN AND VENTRICLES: No acute hemorrhage. No evidence of acute infarct. No hydrocephalus. No extra-axial collection. No mass effect or midline shift.  There is age-related atrophy and mild periventricular white matter disease. There are moderate calcifications within the carotid siphons and vertebral arteries. ORBITS: No acute abnormality. SINUSES: No acute abnormality. SOFT TISSUES AND SKULL: No acute soft tissue abnormality. No skull fracture. IMPRESSION: 1. No acute intracranial abnormality. 2. Age-related atrophy and mild periventricular white matter disease. 3. Moderate calcifications within the carotid siphons and vertebral arteries. Electronically signed by: Evalene Coho MD 04/13/2024 09:27 AM EST RP Workstation: [id number]   DG Chest Portable 1 View Result Date: 04/13/2024 CLINICAL DATA:  Dyspnea. EXAM: PORTABLE CHEST 1 VIEW COMPARISON:  03/26/2024 and CT 03/26/2024 FINDINGS: Stable blunting at both costophrenic angles. Stable retrocardiac densities. Patchy densities in the right  lung are unchanged. Heart size is upper limits of normal. Atherosclerotic calcifications at the aortic arch. IMPRESSION: 1. No significant change since 03/26/2024. 2. Persistent left basilar densities. Small densities in the right lung are similar to recent chest CT. Electronically Signed   By: Juliene Balder M.D.   On: 04/13/2024 09:05    Scheduled Meds:  aspirin   81 mg Oral Daily   Chlorhexidine  Gluconate Cloth  6 each Topical Q0600   collagenase   Topical Daily   glycopyrrolate  0.1 mg Intravenous TID   heparin   5,000 Units Subcutaneous Q8H   metoprolol  tartrate  12.5 mg Oral BID   [START ON 04/15/2024] midodrine   5 mg Oral Q M,W,F   pantoprazole   40 mg Oral Daily   traZODone   75 mg Oral QHS   Continuous Infusions:   LOS: 0 days  MDM: Patient is high risk for one or more organ failure.  They necessitate ongoing hospitalization for continued IV therapies and subsequent lab monitoring. Total time spent interpreting labs and vitals, reviewing the medical record, coordinating care amongst consultants and care team members, directly assessing and discussing care with the patient and/or family: 55 min  Jlee Harkless, DO [facility]  To contact the attending physician between 7A-7P please use Epic Chat. To contact the covering physician during after hours 7P-7A, please review Amion.  04/14/2024, 6:19 PM   *This document has been created with the assistance of dictation software. Please excuse typographical errors. *

## 2024-04-14 NOTE — Progress Notes (Signed)
 [facility]  PROCEDURAL EXPEDITER PROGRESS NOTE  Patient Name: Katie [name]  DOB:[birth year] Date of Admission: 04/13/2024  Date of Assessment:04/14/24   ------------------------------------------------------------------------------------------------------------------- No barriers at this time   -------------------------------------------------------------------------------------------------------------------  [facility] Expediter, Ronal DELENA Bald Please contact us  directly via secure chat (search for [facility]) or by calling us  at [phone] [facility]).

## 2024-04-14 NOTE — Progress Notes (Signed)
  Chaplain On-Call responded to Rapid Response notification at 1225 hours.  The medical team is attending to the patient in the room.  Chaplain assured Staff of availability as needed.  Chaplain Bebe Ardean EMERSON Hershal., [facility]

## 2024-04-14 NOTE — Progress Notes (Signed)
 SLP Cancellation Note  Patient Details Name: Katie [name] MRN: [id number] DOB: May 18, 1955   Cancelled treatment:       Reason Eval/Treat Not Completed: Other (comment) (Per chart review, Rapid Response called earlier this date. Pt's GOC are now comfort. ST to s/o, please reconsult should GOC change.)  Delon Bangs, M.S., CCC-SLP Speech-Language Pathologist [facility] [phone] [place])  [name] 04/14/2024, 2:40 PM

## 2024-04-14 NOTE — Progress Notes (Signed)
 OT Cancellation Note  Patient Details Name: Sharonann Malbrough MRN: [id number] DOB: [birth year]   Cancelled Treatment:    Reason Eval/Treat Not Completed: Medical issues which prohibited therapy. OT arriving to unit to work with pt as rapid is being called to room. OT to hold at this time and re-attempt when pt is able to safely participate in therapeutic intervention.   Izetta Claude, MS, OTR/L , CBIS ascom [phone]  04/14/24, 12:38 PM

## 2024-04-14 NOTE — Significant Event (Signed)
 Rapid Response Event Note   Reason for Call :   [facility] Initial Focused Assessment:   Rapid Response called for patient's heart rhythm going into [facility].  Dr. Juleen and Dr. Jhonny at bedside.  Patient altered- blood pressure in the 40's/50's systolic.      Interventions:  Order to give a liter bolus and to give a Amio bolus.  Also both physicans made patient a DNR/DNI.  Plan of Care:  Once Amio bolus started patient started agonal breathing.  BP in the 60's systolic. Dr. Juleen stated that I could leave and she was going to contact the family.     Event Summary:   MD Notified:  Call Time: Arrival Time: End Time:  Allena JINNY Gunther, RN

## 2024-04-14 NOTE — Consult Note (Signed)
 Hospital Consult    Reason for Consult:  Left A/V Fistula Swelling Requesting Physician:  Dr. Lorane Poland MD  MRN #:  [id number]  History of Present Illness: This is a 68 y.o. female with medical history significant of depression, anxiety, GERD, ESRD on HD, COPD, hypertension, severe aortic stenosis, recurrent syncopal episodes due to severe aortic stenosis, previously determined not to be a TAVR candidate.  Patient was recently discharged from the hospital on 11/18.  She was sent back to her [place] house.  She was pursuing a second opinion via [facility] cardiology to consider TAVR. Reportedly she told in the staff members at assisted living that she was making urine and thus no longer needed hemodialysis.  Her last hemodialysis session was 11/21. Staff brought the patient to the ED on 12/1 with altered mental status. Vascular Surgery was consulted to evaluate patients dialysis access.   Past Medical History:  Diagnosis Date   Chronic diastolic heart failure (HCC)    COPD (chronic obstructive pulmonary disease) (HCC)    Diabetes mellitus without complication (HCC)    ESRD on dialysis (HCC)    Hypertension    Severe aortic stenosis     Past Surgical History:  Procedure Laterality Date   A/V SHUNT INTERVENTION Left 12/21/2021   Procedure: A/V SHUNT INTERVENTION;  Surgeon: Marea Selinda RAMAN, MD;  Location: [facility] INVASIVE CV LAB;  Service: Cardiovascular;  Laterality: Left;   APPENDECTOMY     AV FISTULA PLACEMENT Left 03/16/2021   Procedure: INSERTION OF ARTERIOVENOUS (AV) GORE-TEX GRAFT ARM;  Surgeon: Marea Selinda RAMAN, MD;  Location: [facility] ORS;  Service: Vascular;  Laterality: Left;   DIALYSIS/PERMA CATHETER INSERTION N/A 05/16/2020   Procedure: DIALYSIS/PERMA CATHETER INSERTION;  Surgeon: Marea Selinda RAMAN, MD;  Location: [facility] INVASIVE CV LAB;  Service: Cardiovascular;  Laterality: N/A;   DIALYSIS/PERMA CATHETER REMOVAL N/A 03/26/2022   Procedure: DIALYSIS/PERMA CATHETER REMOVAL;  Surgeon: Marea Selinda RAMAN,  MD;  Location: [facility] INVASIVE CV LAB;  Service: Cardiovascular;  Laterality: N/A;   DIALYSIS/PERMA CATHETER REPAIR N/A 01/22/2022   Procedure: DIALYSIS/PERMA CATHETER INSERT;  Surgeon: Marea Selinda RAMAN, MD;  Location: [facility] INVASIVE CV LAB;  Service: Cardiovascular;  Laterality: N/A;   MANDIBLE SURGERY     RENAL BIOPSY Right    RIGHT HEART CATH N/A 05/10/2020   Procedure: RIGHT HEART CATH;  Surgeon: Mady Bruckner, MD;  Location: [facility] INVASIVE CV LAB;  Service: Cardiovascular;  Laterality: N/A;   RIGHT/LEFT HEART CATH AND CORONARY ANGIOGRAPHY Bilateral 08/15/2023   Procedure: RIGHT/LEFT HEART CATH AND CORONARY ANGIOGRAPHY;  Surgeon: Anner Alm ORN, MD;  Location: [facility] INVASIVE CV LAB;  Service: Cardiovascular;  Laterality: Bilateral;   TEMPORARY DIALYSIS CATHETER N/A 05/11/2020   Procedure: TEMPORARY DIALYSIS CATHETER;  Surgeon: Marea Selinda RAMAN, MD;  Location: [facility] INVASIVE CV LAB;  Service: Cardiovascular;  Laterality: N/A;    Allergies  Allergen Reactions   Penicillins Nausea And Vomiting    Pt states only pills make her vomit Other reaction(s): Nausea/Vomit    Prior to Admission medications   Medication Sig Start Date End Date Taking? Authorizing Provider  acetaminophen  (TYLENOL ) 500 MG tablet Take 500 mg by mouth 3 (three) times daily. Take additional 500 mg every 6 hours as needed for pain   Yes [provider]  albuterol  (VENTOLIN  HFA) 108 (90 Base) MCG/ACT inhaler Inhale 1 puff into the lungs every 6 (six) hours as needed for wheezing. 01/29/22  Yes [provider]  aspirin  81 MG chewable tablet Chew 81 mg by mouth daily.  01/14/24  Yes [provider]  DULoxetine  (CYMBALTA ) 30 MG capsule Take 30 mg by mouth daily. 08/06/23  Yes [provider]  famotidine  (PEPCID ) 20 MG tablet Take 10 mg by mouth at bedtime.   Yes [provider]  fluticasone -salmeterol (ADVAIR) 250-50 MCG/ACT AEPB Inhale 1 puff into the lungs 2 (two) times daily.   Yes [provider]  loperamide  (IMODIUM  A-D) 2 MG tablet Take 2 mg by mouth 4 (four) times daily as needed for diarrhea or loose stools.   Yes [provider]  melatonin 3 MG TABS tablet Take 3 mg by mouth at bedtime.   Yes [provider]  metoprolol  tartrate (LOPRESSOR ) 25 MG tablet Take 0.5 tablets (12.5 mg total) by mouth 2 (two) times daily. 03/31/24  Yes Dezii, Alexandra, DO  midodrine  (PROAMATINE ) 5 MG tablet Take 5 mg by mouth. Monday, Wednesday, Friday prior to dialysis 05/06/23  Yes [provider]  MIRALAX  17 GM/SCOOP powder Take 17 g by mouth daily as needed for mild constipation. 03/09/24  Yes [provider]  ondansetron  (ZOFRAN ) 4 MG tablet Take 4 mg by mouth every Monday, Wednesday, and Friday. 04/27/23  Yes [provider]  oxyCODONE  (OXY IR/ROXICODONE ) 5 MG immediate release tablet Take 5 mg by mouth 4 (four) times daily as needed.   Yes [provider]  pantoprazole  (PROTONIX ) 40 MG tablet Take 40 mg by mouth daily. 01/14/24  Yes [provider]  pramoxine (SARNA SENSITIVE) 1 % LOTN Apply 1 Application topically 2 (two) times daily.   Yes [provider]  topiramate  (TOPAMAX ) 50 MG tablet Take 1 tablet (50 mg total) by mouth 2 (two) times daily as needed (headache). 03/21/21  Yes Sreenath, Sudheer B, MD  traZODone  (DESYREL ) 50 MG tablet Take 75 mg by mouth at bedtime. 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  Tobacco Use   Smoking status: Former    Types: Cigarettes   Smokeless tobacco: Never   Tobacco comments:    Quit over 15 years ago  Vaping Use   Vaping status: Never Used  Substance and Sexual Activity   Alcohol  use: Not  Currently   Drug use: Never   Sexual activity: Not Currently  Other Topics Concern   Not on file  Social History Narrative   Not on file   Social Drivers of Health   Financial Resource Strain: Not on file  Food Insecurity: Patient Declined (04/13/2024)   Hunger Vital Sign    Worried About Running Out of Food in the Last Year: Patient declined    Ran Out of Food in the Last Year: Patient declined  Transportation Needs: Patient Declined (04/13/2024)   PRAPARE - Administrator, Civil Service (Medical): Patient declined    Lack of Transportation (Non-Medical): Patient declined  Physical Activity: Not on file  Stress: Not on file  Social Connections: Patient Declined (04/13/2024)   Social Connection and Isolation Panel    Frequency of Communication with Friends and Family: Patient declined    Frequency of Social Gatherings with Friends and Family: Patient declined    Attends Religious Services: Patient declined    Active  Member of Clubs or Organizations: Patient declined    Attends Banker Meetings: Patient declined    Marital Status: Patient declined  Recent Concern: Social Connections - Socially Isolated (02/01/2024)   Social Connection and Isolation Panel    Frequency of Communication with Friends and Family: More than three times a week    Frequency of Social Gatherings with Friends and Family: Patient declined    Attends Religious Services: Patient declined    Database Administrator or Organizations: No    Attends Banker Meetings: Never    Marital Status: Never married  Intimate Partner Violence: Unknown (04/13/2024)   Humiliation, Afraid, Rape, and Kick questionnaire    Fear of Current or Ex-Partner: No    Emotionally Abused: Not on file    Physically Abused: No    Sexually Abused: No     Family History  Problem Relation Age of Onset   Heart attack Mother    Asthma Mother    Uterine cancer Mother    Skin cancer Father    Prostate  cancer Father    CAD Father    Parkinson's disease Father    Lung cancer Brother     ROS: Otherwise negative unless mentioned in HPI  Physical Examination  Vitals:   04/14/24 0750 04/14/24 1200  BP: (!) 126/53 116/76  Pulse: 97 (!) 133  Resp: 17   Temp: 98.1 F (36.7 C)   SpO2: 100% 97%   Body mass index is 23.82 kg/m.  General:  WDWN in NAD Gait: Not observed HENT: WNL, normocephalic Pulmonary: normal non-labored breathing, without Rales, rhonchi,  wheezing Cardiac: irregular, PEA Arrest., without  Murmurs, rubs or gallops; without carotid bruits Abdomen: positive bowel sounds, soft, NT/ND, no masses Skin: without rashes Vascular Exam/Pulses: Palpable pulses but very weak throughout Extremities: without ischemic changes, without Gangrene , without cellulitis; without open wounds;  Musculoskeletal: no muscle wasting or atrophy  Neurologic: A&O X 3;  No focal weakness or paresthesias are detected; speech is fluent/normal Psychiatric:  The pt has Normal affect. Lymph:  Unremarkable  CBC    Component Value Date/Time   WBC 6.1 04/14/2024 0450   RBC 3.33 (L) 04/14/2024 0450   HGB 11.1 (L) 04/14/2024 0450   HGB 11.7 08/06/2023 1545   HCT 34.0 (L) 04/14/2024 0450   HCT 35.1 08/06/2023 1545   PLT 87 (L) 04/14/2024 0450   PLT 173 08/06/2023 1545   MCV 102.1 (H) 04/14/2024 0450   MCV 96 08/06/2023 1545   MCH 33.3 04/14/2024 0450   MCHC 32.6 04/14/2024 0450   RDW 12.7 04/14/2024 0450   RDW 11.8 08/06/2023 1545   LYMPHSABS 1.1 04/14/2024 0450   MONOABS 0.9 04/14/2024 0450   EOSABS 0.1 04/14/2024 0450   BASOSABS 0.0 04/14/2024 0450    BMET    Component Value Date/Time   NA 136 04/14/2024 0450   NA 139 08/06/2023 1545   K 4.1 04/14/2024 0450   CL 96 (L) 04/14/2024 0450   CO2 24 04/14/2024 0450   GLUCOSE 76 04/14/2024 0450   BUN 59 (H) 04/14/2024 0450   BUN 60 (H) 08/06/2023 1545   CREATININE 5.09 (H) 04/14/2024 0450   CALCIUM  9.1 04/14/2024 0450   GFRNONAA 9  (L) 04/14/2024 0450   GFRAA  01/19/2021 0833    SPECIMEN HEMOLYZED. HEMOLYSIS MAY AFFECT INTEGRITY OF RESULTS.    COAGS: Lab Results  Component Value Date   INR 1.0 03/25/2024   INR 1.0 01/14/2024   INR  1.1 12/11/2021     Non-Invasive Vascular Imaging:   None ordered.   Statin:  No. Beta Blocker:  Yes.   Aspirin :  Yes.   ACEI:  No. ARB:  No. CCB use:  No Other antiplatelets/anticoagulants:  No.    ASSESSMENT/PLAN: This is a 68 y.o. female who was brought to the ED on 12/1 with altered mental status. Vascular Surgery was consulted to evaluate patients dialysis access.   Unfortunately patient went into cardiac arrest, CPR was started. ROSC was obtained and patient was then made comfort care. No need to evaluate dialysis access.    -Dr Cordella Shawl was notified of the patients situation.    Gwendlyn JONELLE Shank [facility] 04/14/2024 12:38 PM

## 2024-04-15 DIAGNOSIS — N186 End stage renal disease: Secondary | ICD-10-CM | POA: Diagnosis not present

## 2024-04-15 DIAGNOSIS — G9340 Encephalopathy, unspecified: Secondary | ICD-10-CM | POA: Diagnosis not present

## 2024-04-15 DIAGNOSIS — I469 Cardiac arrest, cause unspecified: Secondary | ICD-10-CM

## 2024-04-15 LAB — RENAL FUNCTION PANEL
Albumin: 3 g/dL — ABNORMAL LOW (ref 3.5–5.0)
Anion gap: 20 — ABNORMAL HIGH (ref 5–15)
BUN: 80 mg/dL — ABNORMAL HIGH (ref 8–23)
CO2: 21 mmol/L — ABNORMAL LOW (ref 22–32)
Calcium: 9.1 mg/dL (ref 8.9–10.3)
Chloride: 95 mmol/L — ABNORMAL LOW (ref 98–111)
Creatinine, Ser: 5.85 mg/dL — ABNORMAL HIGH (ref 0.44–1.00)
GFR, Estimated: 7 mL/min — ABNORMAL LOW (ref >60)
Glucose, Bld: 96 mg/dL (ref 70–99)
Phosphorus: 6.8 mg/dL — ABNORMAL HIGH (ref 2.5–4.6)
Potassium: 4.7 mmol/L (ref 3.5–5.1)
Sodium: 136 mmol/L (ref 135–145)

## 2024-04-15 LAB — CBC
HCT: 37.6 % (ref 36.0–46.0)
Hemoglobin: 12.3 g/dL (ref 12.0–15.0)
MCH: 32.8 pg (ref 26.0–34.0)
MCHC: 32.7 g/dL (ref 30.0–36.0)
MCV: 100.3 fL — ABNORMAL HIGH (ref 80.0–100.0)
Platelets: 94 K/uL — ABNORMAL LOW (ref 150–400)
RBC: 3.75 MIL/uL — ABNORMAL LOW (ref 3.87–5.11)
RDW: 13 % (ref 11.5–15.5)
WBC: 6.7 K/uL (ref 4.0–10.5)
nRBC: 0 % (ref 0.0–0.2)

## 2024-04-15 NOTE — Progress Notes (Signed)
 PROGRESS NOTE Katie [name]    DOB: 08/19/1955, 68 y.o.  FMW:[id number]    Code Status: Do not attempt resuscitation (DNR) - Comfort care   DOA: 04/13/2024   LOS: 1  Brief hospital course  Katie [name] is a 68 y.o. female with a PMH significant for depression, anxiety, GERD, ESRD on HD, COPD, hypertension, severe aortic stenosis, recurrent syncopal episodes due to severe aortic stenosis, previously determined not to be a TAVR candidate.  Patient was recently discharged from the hospital on 11/18.  She was sent back to her [place] house.  She was pursuing a second opinion via [facility]ology to consider TAVR. Reportedly, she told in the staff members at assisted living that she was making urine and thus no longer needed hemodialysis.  Her last hemodialysis session was 11/21. Staff brought the patient to the ED on 12/1 with altered mental status.  Labs on arrival consistent with needing dialysis, potassium 5.9, BUN 128, hypochloremic.  Head CT without acute intracranial abnormalities.  CXR unchanged from prior. EKG with some peaked T waves, prolonged PR interval. She received urgent dialysis on 12/1. 12/2: rapid response for Afib RVR, then ventricular response and severe bradycardia. Patient was DNR at the time. Family member, sister contacted and patient transitioned to comfort care. Only intervention was amiodarone bolus and IVF.  12/3: patient survived the night and is alert today. Continues on comfort care. At this time, HD is being held and patient requested time to discuss with her family what her GOC will be. Her sister is flying in from [place]  today.  She likely aspirated during event yesterday and has wet cough today. Labs show stable renal function with K+ 4.7, hgb 12.3.  Consulted palliative.  Assessment & Plan  Principal Problem:   Encephalopathy  Comfort measures only - Patient suffers from chronic pain, severe aortic stenosis not amenable to TAVR, ESRD, diabetes, recurrent  admissions, and a recent subdural hematoma after fall.  During this admission she required amiodarone for ventricular arrhythmia.  Attending yesterday discussed several times with her sister, who is HCPOA and patient was made comfort care.  Today, she is alert again and wants time to think and discuss with her family what her GOC are.  - palliative consulted - HD being held at this time and labs overall stable at her baseline.  - supportive care ongoing  Metabolic encephalopathy - Presumed secondary to uremia, patient has missed dialysis for over a week when she is meant to be receiving MWF - BUN severely elevated to 128 on arrival. Now 59>80 after receiving HD   Recurrent syncopal episodes - Previously evaluated for this.  Patient has recurrent syncopal episodes due to severe aortic stenosis - She has been advised to stay in a wheelchair and not attempt ambulation.  No reported falls prior to arrival   Severe aortic stenosis - Was declined by [facility] heart team for TAVR due to poor functional status. - She was referred to [facility] cardiology for second opinion   History of subdural hematoma and intraparenchymal hematoma - After mechanical fall in September - Had follow-up outpatient with neurosurgery - CT head on arrival without acute intracranial abnormality   ESRD - Missed dialysis.  Last dialysis session 11/21.  Unclear why patient stopped going. - Received urgent dialysis on 12/1.  No further HD given CMO   Type 2 diabetes - pleasure feeds. Stop CBG   Hypertension - Resume home meds   Chronic pain syndrome - Home dose oxycodone  resumed  Adjustment disorder with mixed anxiety and depressed mood - Resume home meds   Ascending thoracic aortic aneurysm - Seen on CTA.  4.1 cm.  Stable. - Needs annual outpatient imaging follow-up.  Body mass index is 23.82 kg/m.  VTE ppx: heparin  injection 5,000 Units Start: 04/13/24 1400  Diet:     Diet   DIET - DYS 1 Room service  appropriate? Yes with Assist; Fluid consistency: Thin   Consultants: Nephrology  Palliative   Subjective 04/15/24    Pt reports feeling tired. Continues to have wet cough since yesterday and intermittent SOB but not currently.    Objective  Blood pressure (!) 145/53, pulse 98, temperature 98.9 F (37.2 C), resp. rate 17, height 5' 3 (1.6 m), weight 61 kg, SpO2 100%. No intake or output data in the 24 hours ending 04/15/24 0829 Filed Weights   04/13/24 1159 04/13/24 1622 04/14/24 0100  Weight: 62.6 kg 61 kg 61 kg    Physical Exam:  General: awake, alert, NAD HEENT: dry mucous membranes Respiratory: normal respiratory effort. Wet cough intermittently  Cardiovascular: extremities well perfused Nervous: A&O x3. no gross focal neurologic deficits, normal speech Extremities: moves all equally, no edema, normal tone Skin: dry, intact, several ecchymosis  Psychiatry: tearful  Labs   I have personally reviewed the following labs and imaging studies CBC    Component Value Date/Time   WBC 6.1 04/14/2024 0450   RBC 3.33 (L) 04/14/2024 0450   HGB 11.1 (L) 04/14/2024 0450   HGB 11.7 08/06/2023 1545   HCT 34.0 (L) 04/14/2024 0450   HCT 35.1 08/06/2023 1545   PLT 87 (L) 04/14/2024 0450   PLT 173 08/06/2023 1545   MCV 102.1 (H) 04/14/2024 0450   MCV 96 08/06/2023 1545   MCH 33.3 04/14/2024 0450   MCHC 32.6 04/14/2024 0450   RDW 12.7 04/14/2024 0450   RDW 11.8 08/06/2023 1545   LYMPHSABS 1.1 04/14/2024 0450   MONOABS 0.9 04/14/2024 0450   EOSABS 0.1 04/14/2024 0450   BASOSABS 0.0 04/14/2024 0450      Latest Ref Rng & Units 04/14/2024    4:50 AM 04/13/2024    9:10 AM 03/30/2024    6:53 AM  BMP  Glucose 70 - 99 mg/dL 76  71  91   BUN 8 - 23 mg/dL 59  871  75   Creatinine 0.44 - 1.00 mg/dL 4.90  1.46  5.11   Sodium 135 - 145 mmol/L 136  140  135   Potassium 3.5 - 5.1 mmol/L 4.1  5.9  5.0   Chloride 98 - 111 mmol/L 96  96  100   CO2 22 - 32 mmol/L 24  20  25    Calcium  8.9 -  10.3 mg/dL 9.1  9.3  8.8     CT Head Wo Contrast Result Date: 04/13/2024 EXAM: CT HEAD WITHOUT CONTRAST 04/13/2024 08:55:32 AM TECHNIQUE: CT of the head was performed without the administration of intravenous contrast. Automated exposure control, iterative reconstruction, and/or weight based adjustment of the mA/kV was utilized to reduce the radiation dose to as low as reasonably achievable. COMPARISON: CT of the head dated 03/25/2024. CLINICAL HISTORY: Mental status change, unknown cause. FINDINGS: BRAIN AND VENTRICLES: No acute hemorrhage. No evidence of acute infarct. No hydrocephalus. No extra-axial collection. No mass effect or midline shift. There is age-related atrophy and mild periventricular white matter disease. There are moderate calcifications within the carotid siphons and vertebral arteries. ORBITS: No acute abnormality. SINUSES: No acute abnormality. SOFT TISSUES AND  SKULL: No acute soft tissue abnormality. No skull fracture. IMPRESSION: 1. No acute intracranial abnormality. 2. Age-related atrophy and mild periventricular white matter disease. 3. Moderate calcifications within the carotid siphons and vertebral arteries. Electronically signed by: Evalene Coho MD 04/13/2024 09:27 AM EST RP Workstation: [id number]   DG Chest Portable 1 View Result Date: 04/13/2024 CLINICAL DATA:  Dyspnea. EXAM: PORTABLE CHEST 1 VIEW COMPARISON:  03/26/2024 and CT 03/26/2024 FINDINGS: Stable blunting at both costophrenic angles. Stable retrocardiac densities. Patchy densities in the right lung are unchanged. Heart size is upper limits of normal. Atherosclerotic calcifications at the aortic arch. IMPRESSION: 1. No significant change since 03/26/2024. 2. Persistent left basilar densities. Small densities in the right lung are similar to recent chest CT. Electronically Signed   By: Juliene Balder M.D.   On: 04/13/2024 09:05    Disposition Plan & Communication  Patient status: Inpatient  Admitted From:  Home Planned disposition location: TBD Anticipated discharge date: TBD pending GOC  Family Communication: family members at bedside     Author: Marien LITTIE Piety, DO [facility] 04/15/2024, 8:29 AM   Available by Epic secure chat 7AM-7PM. If 7PM-7AM, please contact night-coverage.  [facility] contact information found on [email or web address].

## 2024-04-15 NOTE — TOC Initial Note (Addendum)
 Transition of Care [facility]) - Initial/Assessment Note    Patient Details  Name: Katie [name] MRN: [id number] Date of Birth: 1956/01/27  [facility] CM/SW Contact:    Dalia GORMAN Fuse, RN Phone Number: 04/15/2024, 11:20 AM  Clinical Narrative:                 TOC met with the patient in the room. Her sister and multiple family and friends were present. Per the sister the patient is from [facility] and wishes to return to the facility as long as they can accommodate her. TOC spoke with [facility] and they will accept the patient back when she is medically ready. The patient is also followed by [facility] PT/OT and RN.  The patient's sister is uncertain if the patient will participate in [facility] at this time. The patient's sister requests that PT assess for any equipment needs. The patient has already paid for [facility]. TOC will continue to follow.  Expected Discharge Plan: Assisted Living Barriers to Discharge: Continued Medical Work up   Patient Goals and CMS Choice            Expected Discharge Plan and Services   Discharge Planning Services: CM Consult   Living arrangements for the past 2 months: Assisted Living Facility                             [facility] Agency: [facility]        Prior Living Arrangements/Services Living arrangements for the past 2 months: Assisted Living Facility Lives with:: Facility Resident              Current home services: Home PT, Home OT, Home RN    Activities of Daily Living   ADL Screening (condition at time of admission) Independently performs ADLs?: No Does the patient have a NEW difficulty with bathing/dressing/toileting/self-feeding that is expected to last >3 days?: No Does the patient have a NEW difficulty with getting in/out of bed, walking, or climbing stairs that is expected to last >3 days?: No Does the patient have a NEW difficulty with communication that is expected to last >3  days?: No Is the patient deaf or have difficulty hearing?: Yes Does the patient have difficulty seeing, even when wearing glasses/contacts?: Yes Does the patient have difficulty concentrating, remembering, or making decisions?: No  Permission Sought/Granted                  Emotional Assessment       Orientation: : Oriented to Self, Oriented to Place, Oriented to  Time, Oriented to Situation      Admission diagnosis:  Hyperkalemia [E87.5] Uremia [N19] Encephalopathy [G93.40] Altered mental status, unspecified altered mental status type [R41.82] Patient Active Problem List   Diagnosis Date Noted   Encephalopathy 04/13/2024   Moderate mitral valve stenosis 03/27/2024   Elevated troponin 03/26/2024   NSTEMI (non-ST elevated myocardial infarction) (HCC) 03/25/2024   Intraparenchymal hematoma of brain (HCC) 01/31/2024   Intracranial hematoma following injury, without loss of consciousness, initial encounter (HCC) 01/31/2024   Syncope 01/15/2024   Acute UTI 01/15/2024   Chest pain 01/15/2024   Head injury 01/15/2024   Sepsis due to pneumonia (HCC) 01/14/2024   GERD without esophagitis 01/14/2024   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   End-stage renal disease on hemodialysis (HCC) 01/14/2024   Chronic pain syndrome 08/26/2023   Pharmacologic therapy 08/26/2023  Disorder of skeletal system 08/26/2023   Problems influencing health status 08/26/2023   CHF (congestive heart failure) (HCC) 08/15/2023   Orthostatic hypotension 06/12/2022   Vomiting and diarrhea 06/12/2022   Cellulitis of left upper extremity 12/11/2021   Type II diabetes mellitus with renal manifestations (HCC) 12/11/2021   Anemia in ESRD (end-stage renal disease) (HCC) 12/11/2021   Elevated lactic acid level 12/11/2021   Chronic diastolic CHF (congestive heart failure) (HCC) 12/11/2021   Left arm cellulitis 11/09/2021   Adjustment disorder with mixed anxiety and depressed  mood 11/07/2021   Severe aortic valve stenosis 11/01/2021   Fecal impaction (HCC) 10/27/2021   Bent bone fracture of ulna 10/24/2021   Hospital acquired PNA 10/24/2021   Sepsis (HCC) 10/23/2021   Cellulitis 10/23/2021   Right flank pain 09/23/2021   Iron deficiency anemia, unspecified 03/27/2021   Allergy, unspecified, initial encounter 03/22/2021   Anaphylactic shock, unspecified, initial encounter 03/22/2021   Unspecified jaundice 03/15/2021   Allergy status to penicillin 03/14/2021   Anemia in chronic kidney disease 03/14/2021   Chronic diastolic (congestive) heart failure (HCC) 03/14/2021   Dependence on renal dialysis 03/14/2021   Depression, unspecified 03/14/2021   Hypertensive heart and chronic kidney disease with heart failure and with stage 5 chronic kidney disease, or end stage renal disease (HCC) 03/14/2021   Personal history of nicotine  dependence 03/14/2021   Secondary hyperparathyroidism of renal origin 03/14/2021   Frequent falls 03/07/2021   Financial difficulty 03/07/2021   ESRD (end stage renal disease) on dialysis (HCC) 12/23/2020   Persistent headaches 06/27/2020   ATN (acute tubular necrosis)    Pulmonary hypertension, unspecified (HCC)    Chronic kidney disease (CKD), stage IV (severe) (HCC)    Pleural effusion    Acute heart failure with preserved ejection fraction (HFpEF) (HCC)    Pressure injury of skin 05/01/2020   Hyperkalemia    Volume overload 04/30/2020   Hypertension    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HCC)    Type 2 diabetes mellitus (HCC)    PCP:  Cordella Corning, FNP Pharmacy:   [facility] [address], [place] - [address] [place] [address] Phone: [phone] Fax: [phone]  [facility] [address]), [place] - [address] [place] [address] Phone: [phone] Fax: [phone]  [facility] - [place], [place] - [phone] [place] [address] Phone: [phone] Fax: [phone]  [facility] [place], [place] - [address] [place] [address] Phone: [phone] Fax: [phone]     Social Drivers of Health (SDOH) Social History: SDOH Screenings   Food Insecurity: Patient Declined (04/13/2024)  Housing: Low Risk  (04/13/2024)  Transportation Needs: Patient Declined (04/13/2024)  Utilities: Patient Declined (04/15/2024)  Social Connections: Patient Declined (04/13/2024)  Recent Concern: Social Connections - Socially Isolated (02/01/2024)  Tobacco Use: Medium Risk (04/13/2024)   SDOH Interventions:     Readmission Risk Interventions    03/27/2024    2:53 PM 01/16/2024   10:27 AM 06/13/2022    4:02 PM  Readmission Risk Prevention Plan  Transportation Screening Complete  Complete  Medication Review (RN Care Manager) Complete  Complete  PCP or Specialist appointment within 3-5 days of discharge Complete Complete   HRI or Home Care Consult Complete  Complete  SW Recovery Care/Counseling Consult Complete Complete   Palliative Care Screening  Not Applicable Not Applicable Not Applicable  Skilled Nursing Facility Not Applicable Not Applicable Not Applicable

## 2024-04-15 NOTE — Plan of Care (Signed)
  Problem: Education: Goal: Knowledge of General Education information will improve Description: Including pain rating scale, medication(s)/side effects and non-pharmacologic comfort measures Outcome: Progressing   Problem: Health Behavior/Discharge Planning: Goal: Ability to manage health-related needs will improve Outcome: Progressing   Problem: Clinical Measurements: Goal: Ability to maintain clinical measurements within normal limits will improve Outcome: Progressing Goal: Will remain free from infection Outcome: Progressing Goal: Diagnostic test results will improve Outcome: Progressing Goal: Respiratory complications will improve Outcome: Progressing Goal: Cardiovascular complication will be avoided Outcome: Progressing   Problem: Activity: Goal: Risk for activity intolerance will decrease Outcome: Progressing   Problem: Nutrition: Goal: Adequate nutrition will be maintained Outcome: Progressing   Problem: Coping: Goal: Level of anxiety will decrease Outcome: Progressing   Problem: Elimination: Goal: Will not experience complications related to bowel motility Outcome: Progressing Goal: Will not experience complications related to urinary retention Outcome: Progressing   Problem: Pain Managment: Goal: General experience of comfort will improve and/or be controlled Outcome: Progressing   Problem: Safety: Goal: Ability to remain free from injury will improve Outcome: Progressing   Problem: Skin Integrity: Goal: Risk for impaired skin integrity will decrease Outcome: Progressing   Problem: Education: Goal: Knowledge of the prescribed therapeutic regimen will improve Outcome: Progressing   Problem: Coping: Goal: Ability to identify and develop effective coping behavior will improve Outcome: Progressing   Problem: Clinical Measurements: Goal: Quality of life will improve Outcome: Progressing   Problem: Respiratory: Goal: Verbalizations of increased ease  of respirations will increase Outcome: Progressing   Problem: Role Relationship: Goal: Family's ability to cope with current situation will improve Outcome: Progressing Goal: Ability to verbalize concerns, feelings, and thoughts to partner or family member will improve Outcome: Progressing   Problem: Pain Management: Goal: Satisfaction with pain management regimen will improve Outcome: Progressing

## 2024-04-15 NOTE — Progress Notes (Signed)
 [facility]  ROUNDING NOTE   Subjective:   Katie [name]  is a 68 y.o.  female with past medical history of ESRD with hemodialysis, diabetes mellitus, COPD, and hypertension. Patient presents to ED for confusion. She is admitted for Hyperkalemia [E87.5] Uremia [N19] Encephalopathy [G93.40] Altered mental status, unspecified altered mental status type [R41.82]  Patient is known to our practice and receives outpatient dialysis treatments at [facility] on a MWF schedule.   Patient seen laying in bed Multiple family members present including sister and granddaughter.  Patient alert, no appetite   Objective:  Vital signs in last 24 hours:  Temp:  [97.5 F (36.4 C)-98.9 F (37.2 C)] 98.9 F (37.2 C) (12/03 0759) Pulse Rate:  [86-98] 98 (12/03 0759) Resp:  [16-20] 17 (12/03 0759) BP: (123-145)/(43-68) 145/53 (12/03 0759) SpO2:  [98 %-100 %] 100 % (12/03 0759)  Weight change:  Filed Weights   04/13/24 1159 04/13/24 1622 04/14/24 0100  Weight: 62.6 kg 61 kg 61 kg    Intake/Output: No intake/output data recorded.   Intake/Output this shift:  No intake/output data recorded.  Physical Exam: General: Critically ill appearing  Head: Normocephalic, atraumatic.   Eyes: Anicteric  Lungs:  Diminished, scattered rhonchi  Heart: Regular rate and rhythm  Abdomen:  Soft, nontender  Extremities:  No peripheral edema. Lt arm edema  Neurologic: Awake and alert  Skin: Warm,dry,   Access: Lt upper AVF    Basic Metabolic Panel: Recent Labs  Lab 04/13/24 0910 04/14/24 0450 04/15/24 1111  NA 140 136 136  K 5.9* 4.1 4.7  CL 96* 96* 95*  CO2 20* 24 21*  GLUCOSE 71 76 96  BUN 128* 59* 80*  CREATININE 8.53* 5.09* 5.85*  CALCIUM  9.3 9.1 9.1  MG 2.4 2.1  --   PHOS 9.1* 5.3* 6.8*    Liver Function Tests: Recent Labs  Lab 04/14/24 0450 04/15/24 1111  AST 80*  --   ALT 135*  --   ALKPHOS 102  --   BILITOT 0.8  --   PROT 6.1*  --   ALBUMIN  3.0* 3.0*     No results for input(s): LIPASE, AMYLASE in the last 168 hours. No results for input(s): AMMONIA in the last 168 hours.  CBC: Recent Labs  Lab 04/13/24 0910 04/14/24 0450 04/15/24 1111  WBC 6.8 6.1 6.7  NEUTROABS  --  3.9  --   HGB 11.4* 11.1* 12.3  HCT 36.3 34.0* 37.6  MCV 105.2* 102.1* 100.3*  PLT 105* 87* 94*    Cardiac Enzymes: No results for input(s): CKTOTAL, CKMB, CKMBINDEX, TROPONINI in the last 168 hours.  BNP: Invalid input(s): POCBNP  CBG: Recent Labs  Lab 04/13/24 0913 04/13/24 1026 04/13/24 2104 04/13/24 2311  GLUCAP 67* 130* 62* 105*    Microbiology: Results for orders placed or performed during the hospital encounter of 03/25/24  Resp panel by RT-PCR (RSV, Flu A&B, Covid) Anterior Nasal Swab     Status: None   Collection Time: 03/25/24  6:53 PM   Specimen: Anterior Nasal Swab  Result Value Ref Range Status   SARS Coronavirus 2 by RT PCR NEGATIVE NEGATIVE Final    Comment: (NOTE) SARS-CoV-2 target nucleic acids are NOT DETECTED.  The SARS-CoV-2 RNA is generally detectable in upper respiratory specimens during the acute phase of infection. The lowest concentration of SARS-CoV-2 viral copies this assay can detect is 138 copies/mL. A negative result does not preclude SARS-Cov-2 infection and should not be used as the sole basis  for treatment or other patient management decisions. A negative result may occur with  improper specimen collection/handling, submission of specimen other than nasopharyngeal swab, presence of viral mutation(s) within the areas targeted by this assay, and inadequate number of viral copies(<138 copies/mL). A negative result must be combined with clinical observations, patient history, and epidemiological information. The expected result is Negative.  Fact Sheet for Patients:  bloggercourse.com  Fact Sheet for Healthcare Providers:   seriousbroker.it  This test is no t yet approved or cleared by the [place]  FDA and  has been authorized for detection and/or diagnosis of SARS-CoV-2 by FDA under an Emergency Use Authorization (EUA). This EUA will remain  in effect (meaning this test can be used) for the duration of the COVID-19 declaration under Section 564(b)(1) of the Act, 21 U.S.C.section 360bbb-3(b)(1), unless the authorization is terminated  or revoked sooner.       Influenza A by PCR NEGATIVE NEGATIVE Final   Influenza B by PCR NEGATIVE NEGATIVE Final    Comment: (NOTE) The Xpert Xpress SARS-CoV-2/FLU/RSV plus assay is intended as an aid in the diagnosis of influenza from Nasopharyngeal swab specimens and should not be used as a sole basis for treatment. Nasal washings and aspirates are unacceptable for Xpert Xpress SARS-CoV-2/FLU/RSV testing.  Fact Sheet for Patients: bloggercourse.com  Fact Sheet for Healthcare Providers: seriousbroker.it  This test is not yet approved or cleared by the [place]  FDA and has been authorized for detection and/or diagnosis of SARS-CoV-2 by FDA under an Emergency Use Authorization (EUA). This EUA will remain in effect (meaning this test can be used) for the duration of the COVID-19 declaration under Section 564(b)(1) of the Act, 21 U.S.C. section 360bbb-3(b)(1), unless the authorization is terminated or revoked.     Resp Syncytial Virus by PCR NEGATIVE NEGATIVE Final    Comment: (NOTE) Fact Sheet for Patients: bloggercourse.com  Fact Sheet for Healthcare Providers: seriousbroker.it  This test is not yet approved or cleared by the [place]  FDA and has been authorized for detection and/or diagnosis of SARS-CoV-2 by FDA under an Emergency Use Authorization (EUA). This EUA will remain in effect (meaning this test can be used) for  the duration of the COVID-19 declaration under Section 564(b)(1) of the Act, 21 U.S.C. section 360bbb-3(b)(1), unless the authorization is terminated or revoked.  Performed at [facility], [address] [place], [place] [address]     Coagulation Studies: No results for input(s): LABPROT, INR in the last 72 hours.   Urinalysis: No results for input(s): COLORURINE, LABSPEC, PHURINE, GLUCOSEU, HGBUR, BILIRUBINUR, KETONESUR, PROTEINUR, UROBILINOGEN, NITRITE, LEUKOCYTESUR in the last 72 hours.  Invalid input(s): APPERANCEUR    Imaging: No results found.     Medications:     Chlorhexidine  Gluconate Cloth  6 each Topical Q0600   collagenase   Topical Daily   heparin   5,000 Units Subcutaneous Q8H   metoprolol  tartrate  12.5 mg Oral BID   midodrine   5 mg Oral Q M,W,F   pantoprazole   40 mg Oral Daily   risperiDONE  0.5 mg Oral BID   traZODone   75 mg Oral QHS   albuterol , alum & mag hydroxide-simeth, artificial tears, glycopyrrolate **OR** glycopyrrolate **OR** glycopyrrolate, hydrALAZINE , HYDROmorphone  (DILAUDID ) injection, labetalol , LORazepam  **OR** LORazepam  **OR** LORazepam , Muscle Rub, oxyCODONE  **OR** oxyCODONE , sodium chloride , topiramate   Assessment/ Plan:  [name] is a 68 y.o.  female with past medical history of ESRD with hemodialysis, diabetes mellitus, COPD, and hypertension. Patient presents to ED with confusion. She  is admitted for Hyperkalemia [E87.5] Uremia [N19] Encephalopathy [G93.40] Altered mental status, unspecified altered mental status type [R41.82]  [facility]/MWF/Lt AVF   Hyperkalemia with end stage renal disease on hemodialysis. Last treatment received on Nov 21. Potassium corrected. Dialysis held yesterday due to patient instability. Patient made comfort care yesterday. Will hold dialysis for now. Family has arrived and patient mentation has improved, will discuss care options. Palliative care  has been consulted.   2. Anemia of chronic kidney disease Lab Results  Component Value Date   HGB 12.3 04/15/2024    Hgb 12.3, will continue to monitor for need of ESA  3. Secondary Hyperparathyroidism: with outpatient labs: PTH 304, phosphorus 4.9, calcium  8.3 on 03/23/24.   Lab Results  Component Value Date   PTH 70 (H) 05/12/2020   CALCIUM  9.1 04/15/2024   CAION 1.13 (L) 08/15/2023   PHOS 6.8 (H) 04/15/2024    Calcium  stable however phosphorus elevated.   4. 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 Most recent hemoglobin A1c is 4.5 on 01/14/24.   Primary team will continue to manage      LOS: 1 Katie [name] 12/3/20251:54 PM

## 2024-04-16 DIAGNOSIS — G9341 Metabolic encephalopathy: Secondary | ICD-10-CM | POA: Diagnosis not present

## 2024-04-16 MED ORDER — HALOPERIDOL 0.5 MG PO TABS: 0.5000 mg | ORAL_TABLET | ORAL | Status: DC | PRN

## 2024-04-16 MED ORDER — HALOPERIDOL LACTATE 5 MG/ML IJ SOLN: 0.5000 mg | INTRAMUSCULAR | Status: DC | PRN

## 2024-04-16 MED ORDER — BIOTENE DRY MOUTH MT LIQD: 15.0000 mL | OROMUCOSAL | Status: DC | PRN

## 2024-04-16 MED ORDER — ONDANSETRON 4 MG PO TBDP: 4.0000 mg | ORAL_TABLET | Freq: Four times a day (QID) | ORAL | Status: DC | PRN

## 2024-04-16 MED ORDER — ONDANSETRON HCL 4 MG/2ML IJ SOLN: 4.0000 mg | Freq: Four times a day (QID) | INTRAMUSCULAR | Status: DC | PRN

## 2024-04-16 MED ORDER — HALOPERIDOL LACTATE 2 MG/ML PO CONC: 0.5000 mg | ORAL | Status: DC | PRN

## 2024-04-16 MED ORDER — COLLAGENASE 250 UNIT/GM EX OINT: TOPICAL_OINTMENT | Freq: Every day | CUTANEOUS | Status: DC | PRN

## 2024-04-16 NOTE — Consult Note (Signed)
 Consultation Note Date: 04/16/2024 at 0915  Patient Name: Katie [name]  DOB: 09/16/1955  MRN: [id number]  Age / Sex: 68 y.o., female  PCP: Cordella Corning, FNP Referring Physician: Lenon Marien CROME, MD  HPI/Patient Profile: 68 y.o. female  with past medical history of COPD, HTN, chronic pain syndrome, type 2 diabetes, depression/anxiety, ESRD-HD (MWF), severe aortic stenosis with recurrent syncopal episodes (not a TAVR candidate), and recent hospitalization in September 2025 for subdural hematoma s/p mechanical fall admitted from [facility] facility on 04/13/2024 with altered mental status.  As per chart review, staff members at patient's ALF reported patient was making urine and thus she said she no longer required hemodialysis.  Her last hemodialysis session was 11/21.  Upon arrival to the ED, labs were consistent with requiring dialysis-potassium 5.9, bun 128, and hypochloremia.  CT of head revealed no acute intracranial abnormalities.  Chest x-ray remains unchanged from prior.  Patient received urgent dialysis on 12/1.  Significant events during hospitalization are as follows:  12/2: rapid response for Afib RVR, then ventricular response and severe bradycardia. Patient was DNR at the time. Family member, sister contacted and patient transitioned to comfort care. Only intervention was amiodarone bolus and IVF.  12/3: patient survived the night and is alert. Continues on comfort care. At this time, HD is being held and patient requested time to discuss with her family what her GOC will be. Her sister arrived from [place]  today.   12/4: PMT consulted to clarify patient/family goals/wishes.  Clinical Assessment and Goals of Care: Extensive chart review completed prior to meeting patient including labs, vital signs, imaging, progress notes, orders, and available advanced directive  documents from current and previous encounters. I then met with patient, her sister/HCPOA Dawn, and Dawn's husband Deatrice at bedside to discuss diagnosis prognosis, GOC, EOL wishes, disposition and options.  I introduced Palliative Medicine as specialized medical care for people living with serious illness. It focuses on providing relief from the symptoms and stress of a serious illness. The goal is to improve quality of life for both the patient and the family.  As far as functional and nutritional status PTA, Dawn shares she has not spoken with her sister in approximately 1 week.  She shares that she faced times with patient 4-5 times per week and believes she was doing well at [facility].  However, she shares something shifted approximately 1 week ago where patient no longer responded to phone calls and apparently decided to stop hemodialysis.  We discussed patient's current illness and what it means in the larger context of patient's on-going co-morbidities.  Reviewed patient's overall poor functional status as a significant contributor to her overall prognosis.  Additionally, discussed her mitral valve and severe aortic stenosis as chronic and irreversible cardiac concerns.   Discussed hemodialysis pros and cons.  Discussed high risk for cardiac death given mitral valve stenosis and need for hemodialysis.  Arland expresses understanding and wishes to no longer have patient accept hemodialysis.  She believes this is what the  patient would want for herself.  I attempted to elicit values and goals of care important to the patient.  Arland shares that she was on the fence yesterday about what to do for her sister.  However, she shares she has great clarity now that the focus of medical treatment should be on patient's comfort.  We discussed comfort measures in detail. Hiedi will no longer receive aggressive medical interventions such as continuous hemodiaysis, vital signs, lab work, radiology testing,  or medications not focused on comfort. All care would focus on how she is looking and feeling. This would include management of any symptoms that may cause discomfort, pain, shortness of breath, cough, nausea, agitation, anxiety, and/or secretions etc. discussed that patient is not narcotic nave that she has chronic pain syndrome.  Dawn endorses patient does well and is happy when she has her pain consistently controlled.  Discussed that her symptoms would be managed with medications and other non-pharmacological interventions such as repositioning, music therapy, or therapeutic listening.   We discussed comfort feeds-allowing small supervised bites of soft foods including ice cream, applesauce, mashed potatoes etc.    Education offered regarding concept specific to human mortality and the limitations of medical interventions to prolong life when the body begins to fail to thrive.  Therapeutic silence, active listening, and emotional support provided.  Arland inquired about prognosis.  I advised her that patient likely has days to 1 week.  Discussed potential for patient to be stable enough to transfer to hospice inpatient unit.  However, at this time patient does not appear to be stable for transfer.  Dawn is open to potential transfer to hospice home but in agreement to reevaluate tomorrow.  Comfort focused care remains.  Adjustments made to [facility] and orders to reflect full comfort measures.   PMT will continue to follow and support.  Primary Decision Maker NEXT OF KIN  Physical Exam Vitals reviewed.  Constitutional:      Comments: Thin frail  HENT:     Head: Normocephalic.     Mouth/Throat:     Mouth: Mucous membranes are moist.  Eyes:     Pupils: Pupils are equal, round, and reactive to light.  Pulmonary:     Breath sounds: Rhonchi present.  Skin:    Coloration: Skin is not jaundiced.     Findings: Bruising present.  Neurological:     Comments: Oriented to self Minimally  interactive Intermittently engaged     Palliative Assessment/Data: 30%     Thank you for this consult. Palliative medicine will continue to follow and assist holistically.   110 minute visit includes: Detailed review of medical records (labs, imaging, vital signs), medically appropriate exam (mental status, respiratory, cardiac, skin), discussed with treatment team, counseling and educating patient, family and staff, documenting clinical information, medication management and coordination of care.  Signed by: Lamarr Gunner, DNP, FNP-BC Palliative Medicine   Please contact Palliative Medicine Team providers via [facility] for questions and concerns.

## 2024-04-16 NOTE — Progress Notes (Signed)
 PROGRESS NOTE Katie [name]    DOB: 01/22/1956, 68 y.o.  FMW:[id number]    Code Status: Do not attempt resuscitation (DNR) - Comfort care   DOA: 04/13/2024   LOS: 2  Brief hospital course  Katie [name] is a 68 y.o. female with a PMH significant for depression, anxiety, GERD, ESRD on HD, COPD, hypertension, severe aortic stenosis, recurrent syncopal episodes due to severe aortic stenosis, previously determined not to be a TAVR candidate.  Patient was recently discharged from the hospital on 11/18.  She was sent back to her [facility] house.  She was pursuing a second opinion via [facility] to consider TAVR. Reportedly, she told in the staff members at assisted living that she was making urine and thus no longer needed hemodialysis.  Her last hemodialysis session was 11/21. Staff brought the patient to the ED on 12/1 with altered mental status.  Labs on arrival consistent with needing dialysis, potassium 5.9, BUN 128, hypochloremic.  Head CT without acute intracranial abnormalities.  CXR unchanged from prior. EKG with some peaked T waves, prolonged PR interval. She received urgent dialysis on 12/1. 12/2: rapid response for Afib RVR, then ventricular response and severe bradycardia. Patient was DNR at the time. Family member, sister contacted and patient transitioned to comfort care. Only intervention was amiodarone bolus and IVF.  12/3: patient survived the night and is alert today. Continues on comfort care. At this time, HD is being held and patient requested time to discuss with her family what her GOC will be. Her sister is flying in from [place]  today.  She likely aspirated during event yesterday and has wet cough today. Labs show stable renal function with K+ 4.7, hgb 12.3.  Consulted palliative. 12/4: she continues on comfort care only and appears to be comfortable during encounter with family at bedside.   Assessment & Plan  Principal Problem:   Encephalopathy  Comfort measures only -  Patient suffers from chronic pain, severe aortic stenosis not amenable to TAVR, ESRD, diabetes, recurrent admissions, and a recent subdural hematoma after fall.  During this admission she required amiodarone for ventricular arrhythmia and was DNR, she was then made comfort care and spontaneously recovered from the arrhythmia. She remains comfort care with poor prognosis.  She is not alert enough to have a conversation at this time.  - palliative consulted - HD being held at this time - supportive care ongoing - I believe she will have a hospital death.  Metabolic encephalopathy - Presumed secondary to uremia, patient has missed dialysis for over a week when she is meant to be receiving MWF - BUN severely elevated to 128 on arrival. Now 59>80 after receiving HD   Recurrent syncopal episodes Severe aortic stenosis - Previously evaluated for this.  Patient has recurrent syncopal episodes due to severe aortic stenosis. Not surgical candidate nor anticoagulation candidate given recent brain bleed. - She has been advised to stay in a wheelchair and not attempt ambulation.  No reported falls prior to arrival    History of subdural hematoma and intraparenchymal hematoma - After mechanical fall in September - Had follow-up outpatient with neurosurgery - CT head on arrival without acute intracranial abnormality   ESRD - Missed dialysis.  Last dialysis session 11/21.  Unclear why patient stopped going. - Received urgent dialysis on 12/1.  No further HD given CMO   Type 2 diabetes - pleasure feeds. Stop CBG   Hypertension - Resume home meds   Chronic pain syndrome - Home dose oxycodone  resumed  Adjustment disorder with mixed anxiety and depressed mood - Resume home meds   Ascending thoracic aortic aneurysm - Seen on CTA.  4.1 cm.  Stable. - Needs annual outpatient imaging follow-up.  Body mass index is 23.82 kg/m.  VTE ppx: heparin  injection 5,000 Units Start: 04/13/24 1400  Diet:      Diet   DIET - DYS 1 Room service appropriate? Yes with Assist; Fluid consistency: Thin   Consultants: Nephrology  Palliative   Subjective 04/16/24    Pt reports nothing. Not able to clearly respond   Objective  Blood pressure (!) 145/53, pulse 98, temperature 98.9 F (37.2 C), resp. rate 17, height 5' 3 (1.6 m), weight 61 kg, SpO2 100%.  Intake/Output Summary (Last 24 hours) at 04/16/2024 0749 Last data filed at 04/16/2024 0657 Gross per 24 hour  Intake 100 ml  Output --  Net 100 ml   Filed Weights   04/13/24 1159 04/13/24 1622 04/14/24 0100  Weight: 62.6 kg 61 kg 61 kg    Physical Exam:  General: alert, NAD HEENT: dry mucous membranes Respiratory: normal respiratory effort. Wet cough intermittently  Cardiovascular: extremities well perfused Skin: dry, intact, several ecchymosis   Labs   I have personally reviewed the following labs and imaging studies CBC    Component Value Date/Time   WBC 6.7 04/15/2024 1111   RBC 3.75 (L) 04/15/2024 1111   HGB 12.3 04/15/2024 1111   HGB 11.7 08/06/2023 1545   HCT 37.6 04/15/2024 1111   HCT 35.1 08/06/2023 1545   PLT 94 (L) 04/15/2024 1111   PLT 173 08/06/2023 1545   MCV 100.3 (H) 04/15/2024 1111   MCV 96 08/06/2023 1545   MCH 32.8 04/15/2024 1111   MCHC 32.7 04/15/2024 1111   RDW 13.0 04/15/2024 1111   RDW 11.8 08/06/2023 1545   LYMPHSABS 1.1 04/14/2024 0450   MONOABS 0.9 04/14/2024 0450   EOSABS 0.1 04/14/2024 0450   BASOSABS 0.0 04/14/2024 0450      Latest Ref Rng & Units 04/15/2024   11:11 AM 04/14/2024    4:50 AM 04/13/2024    9:10 AM  BMP  Glucose 70 - 99 mg/dL 96  76  71   BUN 8 - 23 mg/dL 80  59  871   Creatinine 0.44 - 1.00 mg/dL 4.14  4.90  1.46   Sodium 135 - 145 mmol/L 136  136  140   Potassium 3.5 - 5.1 mmol/L 4.7  4.1  5.9   Chloride 98 - 111 mmol/L 95  96  96   CO2 22 - 32 mmol/L 21  24  20    Calcium  8.9 - 10.3 mg/dL 9.1  9.1  9.3    No results found.  Disposition Plan & Communication  Patient  status: Inpatient  Admitted From: Home Planned disposition location: TBD Anticipated discharge date: TBD pending GOC  Family Communication: family members at bedside     Author: Marien LITTIE Piety, DO [facility] 04/16/2024, 7:49 AM   Available by Epic secure chat 7AM-7PM. If 7PM-7AM, please contact night-coverage.  [facility] contact information found on [email or web address].

## 2024-04-16 NOTE — Plan of Care (Signed)
  Problem: Education: Goal: Knowledge of General Education information will improve Description: Including pain rating scale, medication(s)/side effects and non-pharmacologic comfort measures Outcome: Progressing   Problem: Health Behavior/Discharge Planning: Goal: Ability to manage health-related needs will improve Outcome: Progressing   Problem: Clinical Measurements: Goal: Ability to maintain clinical measurements within normal limits will improve Outcome: Progressing Goal: Will remain free from infection Outcome: Progressing Goal: Diagnostic test results will improve Outcome: Progressing Goal: Respiratory complications will improve Outcome: Progressing Goal: Cardiovascular complication will be avoided Outcome: Progressing   Problem: Activity: Goal: Risk for activity intolerance will decrease Outcome: Progressing   Problem: Nutrition: Goal: Adequate nutrition will be maintained Outcome: Progressing   Problem: Coping: Goal: Level of anxiety will decrease Outcome: Progressing   Problem: Elimination: Goal: Will not experience complications related to bowel motility Outcome: Progressing Goal: Will not experience complications related to urinary retention Outcome: Progressing   Problem: Pain Managment: Goal: General experience of comfort will improve and/or be controlled Outcome: Progressing   Problem: Safety: Goal: Ability to remain free from injury will improve Outcome: Progressing   Problem: Skin Integrity: Goal: Risk for impaired skin integrity will decrease Outcome: Progressing   Problem: Education: Goal: Knowledge of the prescribed therapeutic regimen will improve Outcome: Progressing   Problem: Coping: Goal: Ability to identify and develop effective coping behavior will improve Outcome: Progressing   Problem: Clinical Measurements: Goal: Quality of life will improve Outcome: Progressing   Problem: Respiratory: Goal: Verbalizations of increased ease  of respirations will increase Outcome: Progressing   Problem: Role Relationship: Goal: Family's ability to cope with current situation will improve Outcome: Progressing Goal: Ability to verbalize concerns, feelings, and thoughts to partner or family member will improve Outcome: Progressing   Problem: Pain Management: Goal: Satisfaction with pain management regimen will improve Outcome: Progressing

## 2024-04-16 NOTE — Plan of Care (Signed)
  Problem: Clinical Measurements: Goal: Ability to maintain clinical measurements within normal limits will improve Outcome: Progressing   Problem: Elimination: Goal: Will not experience complications related to bowel motility Outcome: Progressing   Problem: Safety: Goal: Ability to remain free from injury will improve Outcome: Progressing   Problem: Pain Managment: Goal: General experience of comfort will improve and/or be controlled Outcome: Progressing   Problem: Coping: Goal: Level of anxiety will decrease Outcome: Progressing

## 2024-04-17 DIAGNOSIS — G9341 Metabolic encephalopathy: Secondary | ICD-10-CM | POA: Diagnosis not present

## 2024-04-17 DIAGNOSIS — I4891 Unspecified atrial fibrillation: Secondary | ICD-10-CM

## 2024-04-17 DIAGNOSIS — I350 Nonrheumatic aortic (valve) stenosis: Secondary | ICD-10-CM

## 2024-04-20 DIAGNOSIS — I4891 Unspecified atrial fibrillation: Secondary | ICD-10-CM | POA: Insufficient documentation

## 2024-04-20 DIAGNOSIS — N19 Unspecified kidney failure: Secondary | ICD-10-CM | POA: Insufficient documentation

## 2024-04-20 DIAGNOSIS — I5030 Unspecified diastolic (congestive) heart failure: Secondary | ICD-10-CM | POA: Insufficient documentation

## 2024-04-21 ENCOUNTER — Ambulatory Visit: Admitting: Medical

## 2024-05-14 NOTE — Progress Notes (Signed)
   May 12, 2024 0900  Attending Physican Contact  Attending Physician Notified Y  Attending Physician (First and Last Name) Anmar Al-Sultani  Post Mortem Checklist  Date of Death 05/12/24  Time of Death 0815  Pronounced By Caprice Obie Aran LPN  Next of kin notified Yes  Name of next of kin notified of death Dawn Acy  Contact Person's Relationship to Patient Sister  Contact Person's Phone Number [phone]  Contact Person's address [phone]  Keri Communication Notes Family at bedside  Was the patient a No Code Blue or a Limited Code Blue? No  Did the patient die unattended? No  Patient restrained (physical/manual hold/chemical)? *See row information* Not applicable  Body preparation complete N  [facility] (previously known as [facility])  Notification Date 05/12/24  Notification Time 0927  [facility] Number [phone]  Is patient a potential donor? Y  Donation Type Skin;Eyes  Autopsy  Autopsy requested by MD or Family ( Non ME Case) N/A  Patient and Hospital Property Returned  Patient is satisfied that all belongings have been returned? Not applicable  Dead on Arrival (Emergency Department)  Patient dead on arrival? No  Medical Examiner  Is this a medical examiner's case? N  Funeral Home  Funeral home name/address/phone # [facility] unknown/ undecided   Patient family at bedside when she passed away.

## 2024-05-14 NOTE — Progress Notes (Signed)
   Apr 21, 2024 0930  Spiritual Encounters  Care provided to: Family  Conversation partners present during encounter Nurse  Referral source Other (comment) Training And Development Officer)  Reason for visit Patient death   Chaplain was rounding on the Unit and staff shared that the patient had just transitioned.  Chaplain offered a compassionate presence to the family but they declined spiritual care.    Rev. Rana M. Nicholaus, M.Div. Chaplain Resident  [facility]

## 2024-05-14 NOTE — Plan of Care (Signed)
  Problem: Clinical Measurements: Goal: Ability to maintain clinical measurements within normal limits will improve Outcome: Progressing   Problem: Elimination: Goal: Will not experience complications related to bowel motility Outcome: Progressing   Problem: Pain Managment: Goal: General experience of comfort will improve and/or be controlled Outcome: Progressing   Problem: Safety: Goal: Ability to remain free from injury will improve Outcome: Progressing   Problem: Pain Management: Goal: Satisfaction with pain management regimen will improve Outcome: Progressing   Problem: Respiratory: Goal: Verbalizations of increased ease of respirations will increase Outcome: Progressing

## 2024-05-14 NOTE — Progress Notes (Signed)
 Patient

## 2024-05-14 NOTE — Death Summary Note (Signed)
 DEATH SUMMARY   Patient Details  Name: Katie [name] MRN: [id number] DOB: 10/08/1955 ERE:Hmzhnmb, Wilbert, FNP Admission/Discharge Information   Admit Date:  04-23-24  Date of Death: Date of Death: 27-Apr-2024  Time of Death: Time of Death: 0815  Length of Stay: 3   Principle Cause of death: Cardiopulmonary arrest  Hospital Diagnoses: Principal Problem:   Encephalopathy Active Problems:   Hyperkalemia   Hypertension   Type 2 diabetes mellitus (HCC)   ESRD on dialysis (HCC)   Severe aortic stenosis   Uremic encephalopathy   (HFpEF) heart failure with preserved ejection fraction [facility])   Hospital Course: 69 year old female with a history of severe symptomatic aortic stenosis (not a TAVR candidate), ESRD on hemodialysis, COPD, hypertension, depression/anxiety, T2DM, GERD, and recent subdural/intraparenchymal hemorrhage after a fall.  She resided in an assisted living facility.  She presented on 04/23/24 with worsening altered mental status after reportedly refusing hemodialysis because she believed she no longer needed it.  Her last HD session had been 11/21.  Labs were notable for K of 5.9, BUN 128, creatinine 8.53, Phos 9.1, with concerns for severe uremia.  CT head and CXR showed no acute abnormalities.  EKG showed peaked T waves and prolonged PR interval.  She underwent urgent hemodialysis on arrival.  On 12/2, a rapid response was called for atrial fibrillation with RVR, followed by hemodynamic instability and severe bradycardia, consistent with her known severe aortic stenosis and limited cardiac reserve. She was DNR. After discussion with her sister, Ms. Dawn, the patient was transitioned to comfort measures only. Interventions at the time were limited to IV amiodarone  bolus and IV fluids prior to transitioning to comfort care. She subsequently stabilized transiently but continued to demonstrate overall decline. Following the arrhythmic event, she likely aspirated and developed a  wet cough, though no further diagnostic interventions were pursued due to CMO status. Palliative care followed closely; pain and anxiety were managed with comfort medications.   From 12/3-12/4, she remained minimally responsive, appeared comfortable, and continued on full comfort care. Given end-stage cardiac disease, ESRD without dialysis, and global frailty, prognosis was poor and a hospital death was anticipated.   The patient expired peacefully in her hospital room on April 27, 2024 at 8:15 AM with family present at bedside. She was pronounced dead by RN at the above time. Chaplain services were offered to the family.   Procedures: None  Consultations: Nephrology, Palliative Care  The results of significant diagnostics from this hospitalization (including imaging, microbiology, ancillary and laboratory) are listed below for reference.   Significant Diagnostic Studies: CT Head Wo Contrast Result Date: 23-Apr-2024 EXAM: CT HEAD WITHOUT CONTRAST 2024-04-23 08:55:32 AM TECHNIQUE: CT of the head was performed without the administration of intravenous contrast. Automated exposure control, iterative reconstruction, and/or weight based adjustment of the mA/kV was utilized to reduce the radiation dose to as low as reasonably achievable. COMPARISON: CT of the head dated 03/25/2024. CLINICAL HISTORY: Mental status change, unknown cause. FINDINGS: BRAIN AND VENTRICLES: No acute hemorrhage. No evidence of acute infarct. No hydrocephalus. No extra-axial collection. No mass effect or midline shift. There is age-related atrophy and mild periventricular white matter disease. There are moderate calcifications within the carotid siphons and vertebral arteries. ORBITS: No acute abnormality. SINUSES: No acute abnormality. SOFT TISSUES AND SKULL: No acute soft tissue abnormality. No skull fracture. IMPRESSION: 1. No acute intracranial abnormality. 2. Age-related atrophy and mild periventricular white matter disease. 3.  Moderate calcifications within the carotid siphons and vertebral arteries. Electronically signed by: Evalene  Hugh MD 04/13/2024 09:27 AM EST RP Workstation: [id number]   DG Chest Portable 1 View Result Date: 04/13/2024 CLINICAL DATA:  Dyspnea. EXAM: PORTABLE CHEST 1 VIEW COMPARISON:  03/26/2024 and CT 03/26/2024 FINDINGS: Stable blunting at both costophrenic angles. Stable retrocardiac densities. Patchy densities in the right lung are unchanged. Heart size is upper limits of normal. Atherosclerotic calcifications at the aortic arch. IMPRESSION: 1. No significant change since 03/26/2024. 2. Persistent left basilar densities. Small densities in the right lung are similar to recent chest CT. Electronically Signed   By: Juliene Balder M.D.   On: 04/13/2024 09:05   CT Angio Chest Pulmonary Embolism (PE) W or WO Contrast Result Date: 03/26/2024 CLINICAL DATA:  Recurrent syncope after getting up from hemodialysis to void. Clinical concern for pulmonary embolism. No chest symptoms. EXAM: CT ANGIOGRAPHY CHEST WITH CONTRAST TECHNIQUE: Multidetector CT imaging of the chest was performed using the standard protocol during bolus administration of intravenous contrast. Multiplanar CT image reconstructions and MIPs were obtained to evaluate the vascular anatomy. RADIATION DOSE REDUCTION: This exam was performed according to the departmental dose-optimization program which includes automated exposure control, adjustment of the mA and/or kV according to patient size and/or use of iterative reconstruction technique. CONTRAST:  75mL OMNIPAQUE  IOHEXOL  350 MG/ML SOLN COMPARISON:  01/14/2024 FINDINGS: Cardiovascular: Normally opacified pulmonary arteries with no pulmonary arterial filling defects seen. The ascending thoracic aorta continues to measure 4.1 cm in maximum diameter. Stable enlarged heart. Stable small pericardial effusion with a maximum thickness of 11 mm. Atheromatous calcifications, including the coronary arteries  and aorta. Mediastinum/Nodes: Multiple subcentimeter bilateral thyroid nodules. These do not need imaging follow-up. Fluid and air in a dilated proximal esophagus, previously filled with air. No enlarged lymph nodes. Lungs/Pleura: Chronic left lower lobe bronchiectasis and mucous plugging with associated atelectasis. No significant change in bilateral pleural and parenchymal scarring. Mild additional pleural based scarring or atelectasis bilaterally. No pleural fluid. Upper Abdomen: Atheromatous arterial calcifications. Musculoskeletal: Thoracic and lower cervical spine degenerative changes. Review of the MIP images confirms the above findings. IMPRESSION: 1. No pulmonary emboli. 2. 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 Recommend annual imaging followup by CTA or MRA. This recommendation follows 2010 ACCF/AHA/AATS/ACR/ASA/SCA/SCAI/SIR/STS/SVM Guidelines for the Diagnosis and Management of Patients with Thoracic Aortic Disease. Circulation. 2010; 121: Z733-z630. Aortic aneurysm NOS (ICD10-I71.9) 3. Chronic left lower lobe bronchiectasis and mucous plugging with associated atelectasis. 4. Stable bilateral pleural and parenchymal scarring. 5. Stable cardiomegaly and small pericardial effusion. 6. Calcific coronary artery and aortic atherosclerosis. Aortic Atherosclerosis (ICD10-I70.0). Electronically Signed   By: Elspeth Bathe M.D.   On: 03/26/2024 11:40   ECHOCARDIOGRAM COMPLETE Result Date: 03/26/2024    ECHOCARDIOGRAM REPORT   Patient Name:   [facility] Date of Exam: 03/26/2024 Medical Rec #:  [id number]    Height:       60.0 in Accession #:    [id number]   Weight:       114.3 lb Date of Birth:  1955/11/27   BSA:          1.472 m Patient Age:    67 years     BP:           166/50 mmHg Patient Gender: F            HR:           77 bpm. Exam Location:  [facility] Procedure: 2D Echo, Cardiac Doppler and Color Doppler (Both Spectral and Color  Flow Doppler were utilized during procedure).  Indications:     NSTEMI  History:         Patient has prior history of Echocardiogram examinations, most                  recent 01/14/2024. CHF, COPD, Aortic Valve Disease; Risk                  Factors:Hypertension and Diabetes. CKD.  Sonographer:     Philomena Daring Referring Phys:  [id number] AMY N COX Diagnosing Phys: Evalene Lunger MD IMPRESSIONS  1. Left ventricular ejection fraction, by estimation, is 60 to 65%. The left ventricle has normal function. The left ventricle has no regional wall motion abnormalities. There is moderate left ventricular hypertrophy. Left ventricular diastolic parameters are consistent with Grade I diastolic dysfunction (impaired relaxation).  2. Right ventricular systolic function is normal. The right ventricular size is normal. There is severely elevated pulmonary artery systolic pressure. The estimated right ventricular systolic pressure is 68.4 mmHg.  3. The mitral valve is normal in structure. Mild mitral valve regurgitation. Moderate mitral stenosis. The mean mitral valve gradient is 10.0 mmHg. Moderate mitral annular calcification.  4. Tricuspid valve regurgitation is moderate.  5. The aortic valve is normal in structure. There is severe calcifcation of the aortic valve. Aortic valve regurgitation is mild to moderate. Severe aortic valve stenosis. Aortic valve area, by VTI measures 0.85 cm. Aortic valve mean gradient measures 36.5 mmHg. Aortic valve Vmax measures 4.02 m/s.  6. The inferior vena cava is normal in size with greater than 50% respiratory variability, suggesting right atrial pressure of 3 mmHg. FINDINGS  Left Ventricle: Left ventricular ejection fraction, by estimation, is 60 to 65%. The left ventricle has normal function. The left ventricle has no regional wall motion abnormalities. Strain was performed and the global longitudinal strain is indeterminate. The left ventricular internal cavity size was normal in size. There is moderate left ventricular hypertrophy. Left  ventricular diastolic parameters are consistent with Grade I diastolic dysfunction (impaired relaxation). Right Ventricle: The right ventricular size is normal. No increase in right ventricular wall thickness. Right ventricular systolic function is normal. There is severely elevated pulmonary artery systolic pressure. The tricuspid regurgitant velocity is 3.98 m/s, and with an assumed right atrial pressure of 5 mmHg, the estimated right ventricular systolic pressure is 68.4 mmHg. Left Atrium: Left atrial size was normal in size. Right Atrium: Right atrial size was normal in size. Pericardium: There is no evidence of pericardial effusion. Mitral Valve: The mitral valve is normal in structure. There is moderate calcification of the mitral valve leaflet(s). Moderate mitral annular calcification. Mild mitral valve regurgitation. Moderate mitral valve stenosis. MV peak gradient, 18.8 mmHg. The mean mitral valve gradient is 10.0 mmHg. Tricuspid Valve: The tricuspid valve is normal in structure. Tricuspid valve regurgitation is moderate . No evidence of tricuspid stenosis. Aortic Valve: The aortic valve is normal in structure. There is severe calcifcation of the aortic valve. Aortic valve regurgitation is mild to moderate. Severe aortic stenosis is present. Aortic valve mean gradient measures 36.5 mmHg. Aortic valve peak gradient measures 64.6 mmHg. Aortic valve area, by VTI measures 0.85 cm. Pulmonic Valve: The pulmonic valve was normal in structure. Pulmonic valve regurgitation is not visualized. No evidence of pulmonic stenosis. Aorta: The aortic root is normal in size and structure. Venous: The inferior vena cava is normal in size with greater than 50% respiratory variability, suggesting right atrial pressure of 3 mmHg. IAS/Shunts: No  atrial level shunt detected by color flow Doppler. Additional Comments: 3D was performed not requiring image post processing on an independent workstation and was indeterminate.  LEFT  VENTRICLE PLAX 2D LVIDd:         3.60 cm   Diastology LVIDs:         2.30 cm   LV e' medial:    5.77 cm/s LV PW:         1.20 cm   LV E/e' medial:  22.5 LV IVS:        1.50 cm   LV e' lateral:   4.90 cm/s LVOT diam:     1.80 cm   LV E/e' lateral: 26.5 LV SV:         69 LV SV Index:   47 LVOT Area:     2.54 cm  RIGHT VENTRICLE             IVC RV Basal diam:  3.40 cm     IVC diam: 1.40 cm RV Mid diam:    2.70 cm RV S prime:     10.70 cm/s TAPSE (M-mode): 2.2 cm LEFT ATRIUM             Index        RIGHT ATRIUM           Index LA diam:        3.00 cm 2.04 cm/m   RA Area:     15.60 cm LA Vol (A2C):   51.5 ml 35.00 ml/m  RA Volume:   36.40 ml  24.74 ml/m LA Vol (A4C):   42.0 ml 28.54 ml/m LA Biplane Vol: 48.4 ml 32.89 ml/m  AORTIC VALVE AV Area (Vmax):    0.81 cm AV Area (Vmean):   0.80 cm AV Area (VTI):     0.85 cm AV Vmax:           402.00 cm/s AV Vmean:          281.000 cm/s AV VTI:            0.807 m AV Peak Grad:      64.6 mmHg AV Mean Grad:      36.5 mmHg LVOT Vmax:         128.00 cm/s LVOT Vmean:        87.900 cm/s LVOT VTI:          0.270 m LVOT/AV VTI ratio: 0.33  AORTA Ao Root diam: 2.90 cm MITRAL VALVE                TRICUSPID VALVE MV Area (PHT): 3.42 cm     TR Peak grad:   63.4 mmHg MV Area VTI:   1.49 cm     TR Vmax:        398.00 cm/s MV Peak grad:  18.8 mmHg MV Mean grad:  10.0 mmHg    SHUNTS MV Vmax:       2.17 m/s     Systemic VTI:  0.27 m MV Vmean:      152.0 cm/s   Systemic Diam: 1.80 cm MV Decel Time: 222 msec MV E velocity: 130.00 cm/s MV A velocity: 206.00 cm/s MV E/A ratio:  0.63 Evalene Lunger MD Electronically signed by Evalene Lunger MD Signature Date/Time: 03/26/2024/11:39:41 AM    Final    DG Chest Port 1 View Result Date: 03/26/2024 EXAM: 1 VIEW(S) XRAY OF THE CHEST 03/26/2024 08:50:18 AM COMPARISON: None available. CLINICAL HISTORY: Dyspnea. FINDINGS: LINES, TUBES AND DEVICES:  Telemetry leads overlie the chest. LUNGS AND PLEURA: Left base atelectasis or infiltrate. Tiny  bilateral effusions. No pneumothorax. HEART AND MEDIASTINUM: Cardiopericardial silhouette is enlarged. BONES AND SOFT TISSUES: No acute bony abnormality. IMPRESSION: 1. Left basilar atelectasis versus infiltrate with tiny bilateral pleural effusions. 2. Cardiomegaly. Electronically signed by: Camellia Candle MD 03/26/2024 10:35 AM EST RP Workstation: [id number]   DG Pelvis 1-2 Views Result Date: 03/25/2024 EXAM: 1 OR 2 VIEW(S) XRAY OF THE PELVIS 03/25/2024 05:19:00 PM COMPARISON: None available. CLINICAL HISTORY: fall, pain FINDINGS: BONES AND JOINTS: Significant soft tissue artifact overlying the lower pelvis. No definite pubic rami fractures. The pubic symphysis and si joints are intact. No definite hip fractures. Advanced vascular disease. Degenerative changes of the lower lumbar spine. No focal osseous lesion. SOFT TISSUES: Vascular calcifications. IMPRESSION: 1. No acute hip or pelvic fractures 2. Atherosclerotic vascular calcifications. Electronically signed by: Maude Stammer MD 03/25/2024 06:07 PM EST RP Workstation: [id number]   CT HEAD WO CONTRAST ( ) Result Date: 03/25/2024 CLINICAL DATA:  Head and neck trauma fell in bathroom EXAM: CT HEAD WITHOUT CONTRAST CT CERVICAL SPINE WITHOUT CONTRAST TECHNIQUE: Multidetector CT imaging of the head and cervical spine was performed following the standard protocol without intravenous contrast. Multiplanar CT image reconstructions of the cervical spine were also generated. RADIATION DOSE REDUCTION: This exam was performed according to the departmental dose-optimization program which includes automated exposure control, adjustment of the mA and/or kV according to patient size and/or use of iterative reconstruction technique. COMPARISON:  CT brain 02/18/2024, MRI 02/01/2024, CT brain and cervical spine 01/14/2024 FINDINGS: CT HEAD FINDINGS Brain: No acute territorial infarction, hemorrhage or intracranial mass. Minimal encephalomalacia at the right temporal  occipital junction at the site of prior hemorrhage. Atrophy and mild chronic small vessel ischemic changes of the white matter. Nonenlarged ventricles Vascular: No hyperdense vessels.  Carotid vascular calcification Skull: Normal. Negative for fracture or focal lesion. Sinuses/Orbits: No acute finding. Other: Small right forehead scalp hematoma CT CERVICAL SPINE FINDINGS Alignment: Facet alignment within normal limits. Mild reversal of cervical lordosis. Trace retrolisthesis C6 on C7. Skull base and vertebrae: No acute fracture. No primary bone lesion or focal pathologic process. Soft tissues and spinal canal: No prevertebral fluid or swelling. No visible canal hematoma. Disc levels: Multilevel degenerative change. Advanced disc space narrowing C5-C6 and C6-C7. At least mild canal stenosis C5-C6 secondary to posterior disc osteophyte complex. Upper chest: No acute finding. Ten year right thyroid calcification, no specific imaging follow-up recommended. Other: Carotid vascular calcification IMPRESSION: 1. No CT evidence for acute intracranial abnormality. Atrophy and chronic small vessel ischemic changes of the white matter. Small right forehead scalp hematoma. 2. Reversal of cervical lordosis with degenerative changes. No acute osseous abnormality. Electronically Signed   By: Luke Bun M.D.   On: 03/25/2024 16:54   CT Cervical Spine Wo Contrast Result Date: 03/25/2024 CLINICAL DATA:  Head and neck trauma fell in bathroom EXAM: CT HEAD WITHOUT CONTRAST CT CERVICAL SPINE WITHOUT CONTRAST TECHNIQUE: Multidetector CT imaging of the head and cervical spine was performed following the standard protocol without intravenous contrast. Multiplanar CT image reconstructions of the cervical spine were also generated. RADIATION DOSE REDUCTION: This exam was performed according to the departmental dose-optimization program which includes automated exposure control, adjustment of the mA and/or kV according to patient size  and/or use of iterative reconstruction technique. COMPARISON:  CT brain 02/18/2024, MRI 02/01/2024, CT brain and cervical spine 01/14/2024 FINDINGS: CT HEAD FINDINGS Brain: No acute territorial infarction, hemorrhage or intracranial mass.  Minimal encephalomalacia at the right temporal occipital junction at the site of prior hemorrhage. Atrophy and mild chronic small vessel ischemic changes of the white matter. Nonenlarged ventricles Vascular: No hyperdense vessels.  Carotid vascular calcification Skull: Normal. Negative for fracture or focal lesion. Sinuses/Orbits: No acute finding. Other: Small right forehead scalp hematoma CT CERVICAL SPINE FINDINGS Alignment: Facet alignment within normal limits. Mild reversal of cervical lordosis. Trace retrolisthesis C6 on C7. Skull base and vertebrae: No acute fracture. No primary bone lesion or focal pathologic process. Soft tissues and spinal canal: No prevertebral fluid or swelling. No visible canal hematoma. Disc levels: Multilevel degenerative change. Advanced disc space narrowing C5-C6 and C6-C7. At least mild canal stenosis C5-C6 secondary to posterior disc osteophyte complex. Upper chest: No acute finding. Ten year right thyroid calcification, no specific imaging follow-up recommended. Other: Carotid vascular calcification IMPRESSION: 1. No CT evidence for acute intracranial abnormality. Atrophy and chronic small vessel ischemic changes of the white matter. Small right forehead scalp hematoma. 2. Reversal of cervical lordosis with degenerative changes. No acute osseous abnormality. Electronically Signed   By: Luke Bun M.D.   On: 03/25/2024 16:54    Microbiology: No results found for this or any previous visit (from the past 240 hours).  Time spent: 40 minutes  Signed: Laporche Martelle Al-Sultani, MD April 27, 2024

## 2024-05-14 NOTE — Discharge Summary (Incomplete)
 Physician Discharge Summary   Patient: Levette Paulick MRN: [id number] DOB: 09-28-1955  Admit date:     04/13/2024  Discharge date: {dischdate:26783}  Discharge Physician: Duffy Al-Sultani   PCP: Cordella Corning, FNP   Recommendations at discharge:  {Tip this will not be part of the note when signed- Example include specific recommendations for outpatient follow-up, pending tests to follow-up on. (Optional):26781} {Discharge Recommendations:34150}  Discharge Diagnoses: Principal Problem:   Encephalopathy  Resolved Problems:   * No resolved hospital problems. [facility] Course: No notes on file  Assessment and Plan: No notes have been filed under this hospital service. Service: Hospitalist      {Tip this will not be part of the note when signed Body mass index is 23.82 kg/m. , ,  (Optional):26781}   Consultants: *** Procedures performed: ***  Disposition: {Plan; Disposition:26390} Diet recommendation:   DISCHARGE MEDICATION: Allergies as of April 24, 2024       Reactions   Penicillins Nausea And Vomiting   Pt states only pills make her vomit Other reaction(s): Nausea/Vomit        Medication List     ASK your doctor about these medications    acetaminophen  500 MG tablet Commonly known as: TYLENOL  Take 500 mg by mouth 3 (three) times daily. Take additional 500 mg every 6 hours as needed for pain   albuterol  108 (90 Base) MCG/ACT inhaler Commonly known as: VENTOLIN  HFA Inhale 1 puff into the lungs every 6 (six) hours as needed for wheezing.   aspirin  81 MG chewable tablet Chew 81 mg by mouth daily.   DULoxetine  30 MG capsule Commonly known as: CYMBALTA  Take 30 mg by mouth daily.   famotidine  20 MG tablet Commonly known as: PEPCID  Take 10 mg by mouth at bedtime.   fluticasone -salmeterol 250-50 MCG/ACT Aepb Commonly known as: ADVAIR Inhale 1 puff into the lungs 2 (two) times daily.   loperamide  2 MG tablet Commonly known as: IMODIUM  A-D Take 2  mg by mouth 4 (four) times daily as needed for diarrhea or loose stools.   melatonin 3 MG Tabs tablet Take 3 mg by mouth at bedtime.   midodrine  5 MG tablet Commonly known as: PROAMATINE  Take 5 mg by mouth. Monday, Wednesday, Friday prior to dialysis   MiraLax  17 GM/SCOOP powder Generic drug: polyethylene glycol powder Take 17 g by mouth daily as needed for mild constipation.   ondansetron  4 MG tablet Commonly known as: ZOFRAN  Take 4 mg by mouth every Monday, Wednesday, and Friday.   oxyCODONE  5 MG immediate release tablet Commonly known as: Oxy IR/ROXICODONE  Take 5 mg by mouth 4 (four) times daily as needed.   pantoprazole  40 MG tablet Commonly known as: PROTONIX  Take 40 mg by mouth daily.   Sarna Sensitive 1 % Lotn Generic drug: pramoxine Apply 1 Application topically 2 (two) times daily.   traZODone  50 MG tablet Commonly known as: DESYREL  Take 75 mg by mouth at bedtime.         Discharge Exam: Filed Weights   04/13/24 1159 04/13/24 1622 04/14/24 0100  Weight: 62.6 kg 61 kg 61 kg   Blood pressure (!) 135/46, pulse (!) 109, temperature 98.3 F (36.8 C), resp. rate 18, height 5' 3 (1.6 m), weight 61 kg, SpO2 100%.   ***  Condition at discharge: {DC Condition:26389}  The results of significant diagnostics from this hospitalization (including imaging, microbiology, ancillary and laboratory) are listed below for reference.   Imaging Studies: CT Head Wo Contrast Result Date: 04/13/2024  EXAM: CT HEAD WITHOUT CONTRAST 04/13/2024 08:55:32 AM TECHNIQUE: CT of the head was performed without the administration of intravenous contrast. Automated exposure control, iterative reconstruction, and/or weight based adjustment of the mA/kV was utilized to reduce the radiation dose to as low as reasonably achievable. COMPARISON: CT of the head dated 03/25/2024. CLINICAL HISTORY: Mental status change, unknown cause. FINDINGS: BRAIN AND VENTRICLES: No acute hemorrhage. No  evidence of acute infarct. No hydrocephalus. No extra-axial collection. No mass effect or midline shift. There is age-related atrophy and mild periventricular white matter disease. There are moderate calcifications within the carotid siphons and vertebral arteries. ORBITS: No acute abnormality. SINUSES: No acute abnormality. SOFT TISSUES AND SKULL: No acute soft tissue abnormality. No skull fracture. IMPRESSION: 1. No acute intracranial abnormality. 2. Age-related atrophy and mild periventricular white matter disease. 3. Moderate calcifications within the carotid siphons and vertebral arteries. Electronically signed by: Evalene Coho MD 04/13/2024 09:27 AM EST RP Workstation: [id number]   DG Chest Portable 1 View Result Date: 04/13/2024 CLINICAL DATA:  Dyspnea. EXAM: PORTABLE CHEST 1 VIEW COMPARISON:  03/26/2024 and CT 03/26/2024 FINDINGS: Stable blunting at both costophrenic angles. Stable retrocardiac densities. Patchy densities in the right lung are unchanged. Heart size is upper limits of normal. Atherosclerotic calcifications at the aortic arch. IMPRESSION: 1. No significant change since 03/26/2024. 2. Persistent left basilar densities. Small densities in the right lung are similar to recent chest CT. Electronically Signed   By: Juliene Balder M.D.   On: 04/13/2024 09:05   CT Angio Chest Pulmonary Embolism (PE) W or WO Contrast Result Date: 03/26/2024 CLINICAL DATA:  Recurrent syncope after getting up from hemodialysis to void. Clinical concern for pulmonary embolism. No chest symptoms. EXAM: CT ANGIOGRAPHY CHEST WITH CONTRAST TECHNIQUE: Multidetector CT imaging of the chest was performed using the standard protocol during bolus administration of intravenous contrast. Multiplanar CT image reconstructions and MIPs were obtained to evaluate the vascular anatomy. RADIATION DOSE REDUCTION: This exam was performed according to the departmental dose-optimization program which includes automated exposure  control, adjustment of the mA and/or kV according to patient size and/or use of iterative reconstruction technique. CONTRAST:  75mL OMNIPAQUE  IOHEXOL  350 MG/ML SOLN COMPARISON:  01/14/2024 FINDINGS: Cardiovascular: Normally opacified pulmonary arteries with no pulmonary arterial filling defects seen. The ascending thoracic aorta continues to measure 4.1 cm in maximum diameter. Stable enlarged heart. Stable small pericardial effusion with a maximum thickness of 11 mm. Atheromatous calcifications, including the coronary arteries and aorta. Mediastinum/Nodes: Multiple subcentimeter bilateral thyroid nodules. These do not need imaging follow-up. Fluid and air in a dilated proximal esophagus, previously filled with air. No enlarged lymph nodes. Lungs/Pleura: Chronic left lower lobe bronchiectasis and mucous plugging with associated atelectasis. No significant change in bilateral pleural and parenchymal scarring. Mild additional pleural based scarring or atelectasis bilaterally. No pleural fluid. Upper Abdomen: Atheromatous arterial calcifications. Musculoskeletal: Thoracic and lower cervical spine degenerative changes. Review of the MIP images confirms the above findings. IMPRESSION: 1. No pulmonary emboli. 2. 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 Recommend annual imaging followup by CTA or MRA. This recommendation follows 2010 ACCF/AHA/AATS/ACR/ASA/SCA/SCAI/SIR/STS/SVM Guidelines for the Diagnosis and Management of Patients with Thoracic Aortic Disease. Circulation. 2010; 121: Z733-z630. Aortic aneurysm NOS (ICD10-I71.9) 3. Chronic left lower lobe bronchiectasis and mucous plugging with associated atelectasis. 4. Stable bilateral pleural and parenchymal scarring. 5. Stable cardiomegaly and small pericardial effusion. 6. Calcific coronary artery and aortic atherosclerosis. Aortic Atherosclerosis (ICD10-I70.0). Electronically Signed   By: Elspeth Bathe M.D.   On: 03/26/2024  11:40   ECHOCARDIOGRAM  COMPLETE Result Date: 03/26/2024    ECHOCARDIOGRAM REPORT   Patient Name:   [facility] Date of Exam: 03/26/2024 Medical Rec #:  [id number]    Height:       60.0 in Accession #:    [id number]   Weight:       114.3 lb Date of Birth:  09-18-1955   BSA:          1.472 m Patient Age:    67 years     BP:           166/50 mmHg Patient Gender: F            HR:           77 bpm. Exam Location:  [facility] Procedure: 2D Echo, Cardiac Doppler and Color Doppler (Both Spectral and Color            Flow Doppler were utilized during procedure). Indications:     NSTEMI  History:         Patient has prior history of Echocardiogram examinations, most                  recent 01/14/2024. CHF, COPD, Aortic Valve Disease; Risk                  Factors:Hypertension and Diabetes. CKD.  Sonographer:     Philomena Daring Referring Phys:  [id number] AMY N COX Diagnosing Phys: Evalene Lunger MD IMPRESSIONS  1. Left ventricular ejection fraction, by estimation, is 60 to 65%. The left ventricle has normal function. The left ventricle has no regional wall motion abnormalities. There is moderate left ventricular hypertrophy. Left ventricular diastolic parameters are consistent with Grade I diastolic dysfunction (impaired relaxation).  2. Right ventricular systolic function is normal. The right ventricular size is normal. There is severely elevated pulmonary artery systolic pressure. The estimated right ventricular systolic pressure is 68.4 mmHg.  3. The mitral valve is normal in structure. Mild mitral valve regurgitation. Moderate mitral stenosis. The mean mitral valve gradient is 10.0 mmHg. Moderate mitral annular calcification.  4. Tricuspid valve regurgitation is moderate.  5. The aortic valve is normal in structure. There is severe calcifcation of the aortic valve. Aortic valve regurgitation is mild to moderate. Severe aortic valve stenosis. Aortic valve area, by VTI measures 0.85 cm. Aortic valve mean gradient measures 36.5 mmHg. Aortic valve Vmax  measures 4.02 m/s.  6. The inferior vena cava is normal in size with greater than 50% respiratory variability, suggesting right atrial pressure of 3 mmHg. FINDINGS  Left Ventricle: Left ventricular ejection fraction, by estimation, is 60 to 65%. The left ventricle has normal function. The left ventricle has no regional wall motion abnormalities. Strain was performed and the global longitudinal strain is indeterminate. The left ventricular internal cavity size was normal in size. There is moderate left ventricular hypertrophy. Left ventricular diastolic parameters are consistent with Grade I diastolic dysfunction (impaired relaxation). Right Ventricle: The right ventricular size is normal. No increase in right ventricular wall thickness. Right ventricular systolic function is normal. There is severely elevated pulmonary artery systolic pressure. The tricuspid regurgitant velocity is 3.98 m/s, and with an assumed right atrial pressure of 5 mmHg, the estimated right ventricular systolic pressure is 68.4 mmHg. Left Atrium: Left atrial size was normal in size. Right Atrium: Right atrial size was normal in size. Pericardium: There is no evidence of pericardial effusion. Mitral Valve: The mitral valve is normal in structure. There is moderate  calcification of the mitral valve leaflet(s). Moderate mitral annular calcification. Mild mitral valve regurgitation. Moderate mitral valve stenosis. MV peak gradient, 18.8 mmHg. The mean mitral valve gradient is 10.0 mmHg. Tricuspid Valve: The tricuspid valve is normal in structure. Tricuspid valve regurgitation is moderate . No evidence of tricuspid stenosis. Aortic Valve: The aortic valve is normal in structure. There is severe calcifcation of the aortic valve. Aortic valve regurgitation is mild to moderate. Severe aortic stenosis is present. Aortic valve mean gradient measures 36.5 mmHg. Aortic valve peak gradient measures 64.6 mmHg. Aortic valve area, by VTI measures 0.85 cm.  Pulmonic Valve: The pulmonic valve was normal in structure. Pulmonic valve regurgitation is not visualized. No evidence of pulmonic stenosis. Aorta: The aortic root is normal in size and structure. Venous: The inferior vena cava is normal in size with greater than 50% respiratory variability, suggesting right atrial pressure of 3 mmHg. IAS/Shunts: No atrial level shunt detected by color flow Doppler. Additional Comments: 3D was performed not requiring image post processing on an independent workstation and was indeterminate.  LEFT VENTRICLE PLAX 2D LVIDd:         3.60 cm   Diastology LVIDs:         2.30 cm   LV e' medial:    5.77 cm/s LV PW:         1.20 cm   LV E/e' medial:  22.5 LV IVS:        1.50 cm   LV e' lateral:   4.90 cm/s LVOT diam:     1.80 cm   LV E/e' lateral: 26.5 LV SV:         69 LV SV Index:   47 LVOT Area:     2.54 cm  RIGHT VENTRICLE             IVC RV Basal diam:  3.40 cm     IVC diam: 1.40 cm RV Mid diam:    2.70 cm RV S prime:     10.70 cm/s TAPSE (M-mode): 2.2 cm LEFT ATRIUM             Index        RIGHT ATRIUM           Index LA diam:        3.00 cm 2.04 cm/m   RA Area:     15.60 cm LA Vol (A2C):   51.5 ml 35.00 ml/m  RA Volume:   36.40 ml  24.74 ml/m LA Vol (A4C):   42.0 ml 28.54 ml/m LA Biplane Vol: 48.4 ml 32.89 ml/m  AORTIC VALVE AV Area (Vmax):    0.81 cm AV Area (Vmean):   0.80 cm AV Area (VTI):     0.85 cm AV Vmax:           402.00 cm/s AV Vmean:          281.000 cm/s AV VTI:            0.807 m AV Peak Grad:      64.6 mmHg AV Mean Grad:      36.5 mmHg LVOT Vmax:         128.00 cm/s LVOT Vmean:        87.900 cm/s LVOT VTI:          0.270 m LVOT/AV VTI ratio: 0.33  AORTA Ao Root diam: 2.90 cm MITRAL VALVE                TRICUSPID VALVE MV  Area (PHT): 3.42 cm     TR Peak grad:   63.4 mmHg MV Area VTI:   1.49 cm     TR Vmax:        398.00 cm/s MV Peak grad:  18.8 mmHg MV Mean grad:  10.0 mmHg    SHUNTS MV Vmax:       2.17 m/s     Systemic VTI:  0.27 m MV Vmean:      152.0 cm/s    Systemic Diam: 1.80 cm MV Decel Time: 222 msec MV E velocity: 130.00 cm/s MV A velocity: 206.00 cm/s MV E/A ratio:  0.63 Evalene Lunger MD Electronically signed by Evalene Lunger MD Signature Date/Time: 03/26/2024/11:39:41 AM    Final    DG Chest Port 1 View Result Date: 03/26/2024 EXAM: 1 VIEW(S) XRAY OF THE CHEST 03/26/2024 08:50:18 AM COMPARISON: None available. CLINICAL HISTORY: Dyspnea. FINDINGS: LINES, TUBES AND DEVICES: Telemetry leads overlie the chest. LUNGS AND PLEURA: Left base atelectasis or infiltrate. Tiny bilateral effusions. No pneumothorax. HEART AND MEDIASTINUM: Cardiopericardial silhouette is enlarged. BONES AND SOFT TISSUES: No acute bony abnormality. IMPRESSION: 1. Left basilar atelectasis versus infiltrate with tiny bilateral pleural effusions. 2. Cardiomegaly. Electronically signed by: Camellia Candle MD 03/26/2024 10:35 AM EST RP Workstation: [id number]   DG Pelvis 1-2 Views Result Date: 03/25/2024 EXAM: 1 OR 2 VIEW(S) XRAY OF THE PELVIS 03/25/2024 05:19:00 PM COMPARISON: None available. CLINICAL HISTORY: fall, pain FINDINGS: BONES AND JOINTS: Significant soft tissue artifact overlying the lower pelvis. No definite pubic rami fractures. The pubic symphysis and si joints are intact. No definite hip fractures. Advanced vascular disease. Degenerative changes of the lower lumbar spine. No focal osseous lesion. SOFT TISSUES: Vascular calcifications. IMPRESSION: 1. No acute hip or pelvic fractures 2. Atherosclerotic vascular calcifications. Electronically signed by: Maude Stammer MD 03/25/2024 06:07 PM EST RP Workstation: [id number]   CT HEAD WO CONTRAST ( ) Result Date: 03/25/2024 CLINICAL DATA:  Head and neck trauma fell in bathroom EXAM: CT HEAD WITHOUT CONTRAST CT CERVICAL SPINE WITHOUT CONTRAST TECHNIQUE: Multidetector CT imaging of the head and cervical spine was performed following the standard protocol without intravenous contrast. Multiplanar CT image reconstructions of the  cervical spine were also generated. RADIATION DOSE REDUCTION: This exam was performed according to the departmental dose-optimization program which includes automated exposure control, adjustment of the mA and/or kV according to patient size and/or use of iterative reconstruction technique. COMPARISON:  CT brain 02/18/2024, MRI 02/01/2024, CT brain and cervical spine 01/14/2024 FINDINGS: CT HEAD FINDINGS Brain: No acute territorial infarction, hemorrhage or intracranial mass. Minimal encephalomalacia at the right temporal occipital junction at the site of prior hemorrhage. Atrophy and mild chronic small vessel ischemic changes of the white matter. Nonenlarged ventricles Vascular: No hyperdense vessels.  Carotid vascular calcification Skull: Normal. Negative for fracture or focal lesion. Sinuses/Orbits: No acute finding. Other: Small right forehead scalp hematoma CT CERVICAL SPINE FINDINGS Alignment: Facet alignment within normal limits. Mild reversal of cervical lordosis. Trace retrolisthesis C6 on C7. Skull base and vertebrae: No acute fracture. No primary bone lesion or focal pathologic process. Soft tissues and spinal canal: No prevertebral fluid or swelling. No visible canal hematoma. Disc levels: Multilevel degenerative change. Advanced disc space narrowing C5-C6 and C6-C7. At least mild canal stenosis C5-C6 secondary to posterior disc osteophyte complex. Upper chest: No acute finding. Ten year right thyroid calcification, no specific imaging follow-up recommended. Other: Carotid vascular calcification IMPRESSION: 1. No CT evidence for acute intracranial abnormality. Atrophy and chronic small vessel ischemic changes  of the white matter. Small right forehead scalp hematoma. 2. Reversal of cervical lordosis with degenerative changes. No acute osseous abnormality. Electronically Signed   By: Luke Bun M.D.   On: 03/25/2024 16:54   CT Cervical Spine Wo Contrast Result Date: 03/25/2024 CLINICAL DATA:  Head  and neck trauma fell in bathroom EXAM: CT HEAD WITHOUT CONTRAST CT CERVICAL SPINE WITHOUT CONTRAST TECHNIQUE: Multidetector CT imaging of the head and cervical spine was performed following the standard protocol without intravenous contrast. Multiplanar CT image reconstructions of the cervical spine were also generated. RADIATION DOSE REDUCTION: This exam was performed according to the departmental dose-optimization program which includes automated exposure control, adjustment of the mA and/or kV according to patient size and/or use of iterative reconstruction technique. COMPARISON:  CT brain 02/18/2024, MRI 02/01/2024, CT brain and cervical spine 01/14/2024 FINDINGS: CT HEAD FINDINGS Brain: No acute territorial infarction, hemorrhage or intracranial mass. Minimal encephalomalacia at the right temporal occipital junction at the site of prior hemorrhage. Atrophy and mild chronic small vessel ischemic changes of the white matter. Nonenlarged ventricles Vascular: No hyperdense vessels.  Carotid vascular calcification Skull: Normal. Negative for fracture or focal lesion. Sinuses/Orbits: No acute finding. Other: Small right forehead scalp hematoma CT CERVICAL SPINE FINDINGS Alignment: Facet alignment within normal limits. Mild reversal of cervical lordosis. Trace retrolisthesis C6 on C7. Skull base and vertebrae: No acute fracture. No primary bone lesion or focal pathologic process. Soft tissues and spinal canal: No prevertebral fluid or swelling. No visible canal hematoma. Disc levels: Multilevel degenerative change. Advanced disc space narrowing C5-C6 and C6-C7. At least mild canal stenosis C5-C6 secondary to posterior disc osteophyte complex. Upper chest: No acute finding. Ten year right thyroid calcification, no specific imaging follow-up recommended. Other: Carotid vascular calcification IMPRESSION: 1. No CT evidence for acute intracranial abnormality. Atrophy and chronic small vessel ischemic changes of the white  matter. Small right forehead scalp hematoma. 2. Reversal of cervical lordosis with degenerative changes. No acute osseous abnormality. Electronically Signed   By: Luke Bun M.D.   On: 03/25/2024 16:54    Microbiology: Results for orders placed or performed during the hospital encounter of 03/25/24  Resp panel by RT-PCR (RSV, Flu A&B, Covid) Anterior Nasal Swab     Status: None   Collection Time: 03/25/24  6:53 PM   Specimen: Anterior Nasal Swab  Result Value Ref Range Status   SARS Coronavirus 2 by RT PCR NEGATIVE NEGATIVE Final    Comment: (NOTE) SARS-CoV-2 target nucleic acids are NOT DETECTED.  The SARS-CoV-2 RNA is generally detectable in upper respiratory specimens during the acute phase of infection. The lowest concentration of SARS-CoV-2 viral copies this assay can detect is 138 copies/mL. A negative result does not preclude SARS-Cov-2 infection and should not be used as the sole basis for treatment or other patient management decisions. A negative result may occur with  improper specimen collection/handling, submission of specimen other than nasopharyngeal swab, presence of viral mutation(s) within the areas targeted by this assay, and inadequate number of viral copies(<138 copies/mL). A negative result must be combined with clinical observations, patient history, and epidemiological information. The expected result is Negative.  Fact Sheet for Patients:  bloggercourse.com  Fact Sheet for Healthcare Providers:  seriousbroker.it  This test is no t yet approved or cleared by the [place]  FDA and  has been authorized for detection and/or diagnosis of SARS-CoV-2 by FDA under an Emergency Use Authorization (EUA). This EUA will remain  in effect (meaning this test can  be used) for the duration of the COVID-19 declaration under Section 564(b)(1) of the Act, 21 U.S.C.section 360bbb-3(b)(1), unless the authorization is  terminated  or revoked sooner.       Influenza A by PCR NEGATIVE NEGATIVE Final   Influenza B by PCR NEGATIVE NEGATIVE Final    Comment: (NOTE) The Xpert Xpress SARS-CoV-2/FLU/RSV plus assay is intended as an aid in the diagnosis of influenza from Nasopharyngeal swab specimens and should not be used as a sole basis for treatment. Nasal washings and aspirates are unacceptable for Xpert Xpress SARS-CoV-2/FLU/RSV testing.  Fact Sheet for Patients: bloggercourse.com  Fact Sheet for Healthcare Providers: seriousbroker.it  This test is not yet approved or cleared by the [place]  FDA and has been authorized for detection and/or diagnosis of SARS-CoV-2 by FDA under an Emergency Use Authorization (EUA). This EUA will remain in effect (meaning this test can be used) for the duration of the COVID-19 declaration under Section 564(b)(1) of the Act, 21 U.S.C. section 360bbb-3(b)(1), unless the authorization is terminated or revoked.     Resp Syncytial Virus by PCR NEGATIVE NEGATIVE Final    Comment: (NOTE) Fact Sheet for Patients: bloggercourse.com  Fact Sheet for Healthcare Providers: seriousbroker.it  This test is not yet approved or cleared by the [place]  FDA and has been authorized for detection and/or diagnosis of SARS-CoV-2 by FDA under an Emergency Use Authorization (EUA). This EUA will remain in effect (meaning this test can be used) for the duration of the COVID-19 declaration under Section 564(b)(1) of the Act, 21 U.S.C. section 360bbb-3(b)(1), unless the authorization is terminated or revoked.  Performed at [facility], [address] [place], [place] [address]     Labs: CBC: Recent Labs  Lab 04/13/24 0910 04/14/24 0450 04/15/24 1111  WBC 6.8 6.1 6.7  NEUTROABS  --  3.9  --   HGB 11.4* 11.1* 12.3  HCT 36.3 34.0* 37.6  MCV 105.2* 102.1*  100.3*  PLT 105* 87* 94*   Basic Metabolic Panel: Recent Labs  Lab 04/13/24 0910 04/14/24 0450 04/15/24 1111  NA 140 136 136  K 5.9* 4.1 4.7  CL 96* 96* 95*  CO2 20* 24 21*  GLUCOSE 71 76 96  BUN 128* 59* 80*  CREATININE 8.53* 5.09* 5.85*  CALCIUM  9.3 9.1 9.1  MG 2.4 2.1  --   PHOS 9.1* 5.3* 6.8*   Liver Function Tests: Recent Labs  Lab 04/14/24 0450 04/15/24 1111  AST 80*  --   ALT 135*  --   ALKPHOS 102  --   BILITOT 0.8  --   PROT 6.1*  --   ALBUMIN  3.0* 3.0*   CBG: Recent Labs  Lab 04/13/24 0913 04/13/24 1026 04/13/24 2104 04/13/24 2311  GLUCAP 67* 130* 62* 105*    Discharge time spent: Time Coordinating Discharge: I spent a total of 35 minutes engaged in face-to-face discussion with the patient and/or caregivers regarding the patient's care, assessment, plan, and discharge disposition. Over 50% of this time was dedicated to counseling the patient on the risks and benefits of treatment options and the discharge plan, as well as coordinating post-discharge care.   Signed: Kashis Penley Al-Sultani, MD [facility] 2024/05/17

## 2024-05-14 DEATH — deceased
